# Patient Record
Sex: Female | Born: 1970 | Race: Black or African American | Hispanic: No | Smoking: Never smoker
Health system: Southern US, Community
[De-identification: ages and names within clinical notes are randomized; demographics above are authoritative.]

## PROBLEM LIST (undated history)

## (undated) DIAGNOSIS — E119 Type 2 diabetes mellitus without complications: Secondary | ICD-10-CM

## (undated) DIAGNOSIS — U071 COVID-19: Secondary | ICD-10-CM

## (undated) DIAGNOSIS — D649 Anemia, unspecified: Secondary | ICD-10-CM

## (undated) DIAGNOSIS — J189 Pneumonia, unspecified organism: Secondary | ICD-10-CM

## (undated) DIAGNOSIS — I10 Essential (primary) hypertension: Secondary | ICD-10-CM

## (undated) DIAGNOSIS — C801 Malignant (primary) neoplasm, unspecified: Secondary | ICD-10-CM

## (undated) DIAGNOSIS — Z9889 Other specified postprocedural states: Secondary | ICD-10-CM

## (undated) DIAGNOSIS — Z973 Presence of spectacles and contact lenses: Secondary | ICD-10-CM

## (undated) DIAGNOSIS — M1990 Unspecified osteoarthritis, unspecified site: Secondary | ICD-10-CM

## (undated) DIAGNOSIS — C50919 Malignant neoplasm of unspecified site of unspecified female breast: Secondary | ICD-10-CM

## (undated) DIAGNOSIS — K219 Gastro-esophageal reflux disease without esophagitis: Secondary | ICD-10-CM

## (undated) DIAGNOSIS — R0600 Dyspnea, unspecified: Secondary | ICD-10-CM

## (undated) DIAGNOSIS — R112 Nausea with vomiting, unspecified: Secondary | ICD-10-CM

## (undated) DIAGNOSIS — Z992 Dependence on renal dialysis: Secondary | ICD-10-CM

## (undated) DIAGNOSIS — S82899A Other fracture of unspecified lower leg, initial encounter for closed fracture: Secondary | ICD-10-CM

## (undated) DIAGNOSIS — J45909 Unspecified asthma, uncomplicated: Secondary | ICD-10-CM

## (undated) DIAGNOSIS — N186 End stage renal disease: Secondary | ICD-10-CM

## (undated) DIAGNOSIS — Z5189 Encounter for other specified aftercare: Secondary | ICD-10-CM

## (undated) DIAGNOSIS — G4733 Obstructive sleep apnea (adult) (pediatric): Secondary | ICD-10-CM

## (undated) HISTORY — PX: BREAST LUMPECTOMY: SHX2

## (undated) HISTORY — PX: MASTECTOMY: SHX3

## (undated) HISTORY — PX: DILATION AND CURETTAGE OF UTERUS: SHX78

## (undated) HISTORY — PX: ABDOMINAL HYSTERECTOMY: SHX81

## (undated) HISTORY — PX: CHOLECYSTECTOMY: SHX55

---

## 2012-09-27 ENCOUNTER — Emergency Department (HOSPITAL_COMMUNITY): Payer: BC Managed Care – PPO

## 2012-09-27 ENCOUNTER — Encounter (HOSPITAL_COMMUNITY): Payer: Self-pay | Admitting: *Deleted

## 2012-09-27 ENCOUNTER — Emergency Department (HOSPITAL_COMMUNITY)
Admission: EM | Admit: 2012-09-27 | Discharge: 2012-09-27 | Disposition: A | Discharge status: Home / Self Care | Payer: BC Managed Care – PPO | Attending: Emergency Medicine | Admitting: Emergency Medicine

## 2012-09-27 DIAGNOSIS — H5710 Ocular pain, unspecified eye: Secondary | ICD-10-CM | POA: Insufficient documentation

## 2012-09-27 DIAGNOSIS — I10 Essential (primary) hypertension: Secondary | ICD-10-CM | POA: Insufficient documentation

## 2012-09-27 DIAGNOSIS — D649 Anemia, unspecified: Secondary | ICD-10-CM | POA: Insufficient documentation

## 2012-09-27 DIAGNOSIS — I498 Other specified cardiac arrhythmias: Secondary | ICD-10-CM | POA: Insufficient documentation

## 2012-09-27 DIAGNOSIS — R51 Headache: Secondary | ICD-10-CM | POA: Insufficient documentation

## 2012-09-27 DIAGNOSIS — H538 Other visual disturbances: Secondary | ICD-10-CM | POA: Insufficient documentation

## 2012-09-27 DIAGNOSIS — N289 Disorder of kidney and ureter, unspecified: Secondary | ICD-10-CM | POA: Insufficient documentation

## 2012-09-27 DIAGNOSIS — R61 Generalized hyperhidrosis: Secondary | ICD-10-CM | POA: Insufficient documentation

## 2012-09-27 DIAGNOSIS — R0789 Other chest pain: Secondary | ICD-10-CM

## 2012-09-27 DIAGNOSIS — R071 Chest pain on breathing: Secondary | ICD-10-CM | POA: Insufficient documentation

## 2012-09-27 DIAGNOSIS — E119 Type 2 diabetes mellitus without complications: Secondary | ICD-10-CM | POA: Insufficient documentation

## 2012-09-27 HISTORY — DX: Encounter for other specified aftercare: Z51.89

## 2012-09-27 HISTORY — DX: Essential (primary) hypertension: I10

## 2012-09-27 HISTORY — DX: Type 2 diabetes mellitus without complications: E11.9

## 2012-09-27 LAB — BASIC METABOLIC PANEL
BUN: 30 mg/dL — ABNORMAL HIGH (ref 6–23)
CO2: 18 mEq/L — ABNORMAL LOW (ref 19–32)
Chloride: 101 mEq/L (ref 96–112)
GFR calc non Af Amer: 26 mL/min — ABNORMAL LOW (ref >90)
Glucose, Bld: 187 mg/dL — ABNORMAL HIGH (ref 70–99)
Potassium: 4.2 mEq/L (ref 3.5–5.1)

## 2012-09-27 LAB — DIFFERENTIAL
Eosinophils Absolute: 0.5 10*3/uL (ref 0.0–0.7)
Eosinophils Relative: 4 % (ref 0–5)
Lymphs Abs: 3.8 10*3/uL (ref 0.7–4.0)

## 2012-09-27 LAB — PROTIME-INR: Prothrombin Time: 13.2 seconds (ref 11.6–15.2)

## 2012-09-27 LAB — CBC
MCH: 26.9 pg (ref 26.0–34.0)
MCV: 81.3 fL (ref 78.0–100.0)
Platelets: 366 10*3/uL (ref 150–400)
RBC: 3.68 MIL/uL — ABNORMAL LOW (ref 3.87–5.11)
RDW: 12.9 % (ref 11.5–15.5)

## 2012-09-27 MED ORDER — KETOROLAC TROMETHAMINE 30 MG/ML IJ SOLN
30.0000 mg | Freq: Once | INTRAMUSCULAR | Status: AC
  Administered 2012-09-27: 30 mg via INTRAVENOUS
  Filled 2012-09-27: qty 1

## 2012-09-27 MED ORDER — ONDANSETRON HCL 4 MG/2ML IJ SOLN
INTRAMUSCULAR | Status: AC
  Administered 2012-09-27: 4 mg via INTRAVENOUS
  Filled 2012-09-27: qty 2

## 2012-09-27 MED ORDER — ONDANSETRON HCL 4 MG/2ML IJ SOLN
4.0000 mg | Freq: Once | INTRAMUSCULAR | Status: AC
  Administered 2012-09-27: 4 mg via INTRAVENOUS

## 2012-09-27 MED ORDER — OXYCODONE-ACETAMINOPHEN 5-325 MG PO TABS: 1.0000 | ORAL_TABLET | Freq: Four times a day (QID) | ORAL | Status: DC | PRN

## 2012-09-27 MED ORDER — METOCLOPRAMIDE HCL 5 MG/ML IJ SOLN
10.0000 mg | Freq: Once | INTRAMUSCULAR | Status: AC
  Administered 2012-09-27: 10 mg via INTRAVENOUS
  Filled 2012-09-27: qty 2

## 2012-09-27 MED ORDER — DIPHENHYDRAMINE HCL 50 MG/ML IJ SOLN
12.5000 mg | Freq: Once | INTRAMUSCULAR | Status: AC
  Administered 2012-09-27: 12.5 mg via INTRAVENOUS
  Filled 2012-09-27: qty 1

## 2012-09-27 MED ORDER — SODIUM CHLORIDE 0.9 % IV BOLUS (SEPSIS)
500.0000 mL | Freq: Once | INTRAVENOUS | Status: AC
  Administered 2012-09-27: 500 mL via INTRAVENOUS

## 2012-09-27 MED ORDER — HEPARIN SODIUM (PORCINE) 5000 UNIT/ML IJ SOLN: 60.0000 [IU]/kg | Freq: Once | INTRAMUSCULAR | Status: DC

## 2012-09-27 MED ORDER — ASPIRIN 81 MG PO CHEW
324.0000 mg | CHEWABLE_TABLET | Freq: Once | ORAL | Status: AC
  Administered 2012-09-27: 324 mg via ORAL
  Filled 2012-09-27: qty 4

## 2012-09-27 NOTE — ED Notes (Signed)
Awaiting MD evaluation.

## 2012-09-27 NOTE — ED Notes (Addendum)
States that the pain she is having is in the left side of her chest, radiating up into the left side of her face and feels tight.  Pt appears very anxious.  States that her left neck and left shoulder feel tender to touch.

## 2012-09-27 NOTE — ED Provider Notes (Signed)
History   This chart was scribed for Nat Christen, MD scribed by Mitchell Heir. The patient was seen in room APA02/APA02 at 21:59   CSN: [id number]  Arrival date & time 09/27/12  2038  Chief Complaint  Patient presents with  . Chest Pain    (Consider location/radiation/quality/duration/timing/severity/associated sxs/prior treatment) The history is provided by the patient. No language interpreter was used.   Patricia [name] is a 41 y.o. female who presents to the Emergency Department complaining of constant moderate nagging, dull CP, onset approximately 5 hours ago and located at left superior chest wall that radiates into left arm.Reports associated diaphoresis last night as well, but states it has since resolved. Patient also explains this morning after she woke up she noticed she had blurry vision and says she was seeing "black dots." She attributed it to her HTN and says she took her medication, but it did not improve until this evening. Pt states that at ED arrival the left side of her head and eyes started hurting. Patient states that she has hx of HTN, and DM, but denies family cardiac hx or smoking. She says she has been taking all medications as prescribed and denies SOB, nausea, and emesis. PCP: Dr. Legrand Rams Past Medical History  Diagnosis Date  . Hypertension   . Diabetes mellitus without complication   . Blood transfusion without reported diagnosis     Past Surgical History  Procedure Date  . Cholecystectomy   . Cesarean section   . Dilation and curettage of uterus   . Abdominal hysterectomy     No family history on file.  History  Substance Use Topics  . Smoking status: Never Smoker   . Smokeless tobacco: Not on file  . Alcohol Use: No   Review of Systems  All other systems reviewed and are negative.   10 Systems reviewed and are negative for acute change except as noted in the HPI. Allergies  Review of patient's allergies indicates no known allergies.  Home  Medications   Current Outpatient Rx  Name Route Sig Dispense Refill  . AMLODIPINE BESYLATE 10 MG PO TABS Oral Take 10 mg by mouth daily.    Marland Kitchen GLIPIZIDE 10 MG PO TABS Oral Take 10 mg by mouth 2 (two) times daily before a meal.    . LISINOPRIL-HYDROCHLOROTHIAZIDE 20-25 MG PO TABS Oral Take 1 tablet by mouth daily.    Marland Kitchen METFORMIN HCL 1000 MG PO TABS Oral Take 1,000 mg by mouth 2 (two) times daily with a meal.    . OMEPRAZOLE 20 MG PO CPDR Oral Take 20 mg by mouth daily.      BP 134/81  Pulse 92  Temp 98.1 F (36.7 C) (Oral)  Resp 20  Ht 5\' 3"  (1.6 m)  Wt 248 lb (112.492 kg)  BMI 43.93 kg/m2  SpO2 99%  Physical Exam  Nursing note and vitals reviewed. Constitutional: She is oriented to person, place, and time. She appears well-developed and well-nourished.       Obese  HENT:  Head: Normocephalic and atraumatic.  Eyes: Conjunctivae normal and EOM are normal. Pupils are equal, round, and reactive to light.  Neck: Normal range of motion. Neck supple.  Cardiovascular: Normal rate, regular rhythm and normal heart sounds.   Pulmonary/Chest: Effort normal and breath sounds normal.       Tender at left superior chest wall  Abdominal: Soft. Bowel sounds are normal.  Musculoskeletal: Normal range of motion.       Tender at left  arm  Neurological: She is alert and oriented to person, place, and time.  Skin: Skin is warm and dry.  Psychiatric: She has a normal mood and affect.    ED Course  Procedures (including critical care time) DIAGNOSTIC STUDIES: Oxygen Saturation is 100% on room air, normal by my interpretation.    COORDINATION OF CARE: 22:04 Provided intent to perform CXR and provide Toradol and Zofran. Pt and family agreeable.    Labs Reviewed  CBC  DIFFERENTIAL  PROTIME-INR  APTT  BASIC METABOLIC PANEL   Dg Chest Portable 1 View  09/27/2012  *RADIOLOGY REPORT*  Clinical Data: Left-sided chest pain  PORTABLE CHEST - 1 VIEW  Comparison: None.  Findings: The heart,  mediastinum and hila are within normal limits. The lungs are clear.  No pleural effusion or pneumothorax.  The bony thorax is intact.  IMPRESSION: Normal AP chest radiograph   Original Report Authenticated By: Lasandra Beech, M.D.      No diagnosis found.  Date: 09/27/2012  Rate: 103  Rhythm: sinus tachycardia  QRS Axis: normal  Intervals: normal  ST/T Wave abnormalities: normal  Conduction Disutrbances:none  Narrative Interpretation:   Old EKG Reviewed: none available   MDM  Obvious tenderness over left chest wall suggest chest wall pain. I discussed abnormal lab tests with patient and her husband including hemoglobin of 9.9, creatinine of 2.2, glucose of 187. Discharge home on Percocet     I personally performed the services described in this documentation, which was scribed in my presence. The recorded information has been reviewed and considered.        Nat Christen, MD 09/27/12 2322

## 2012-09-27 NOTE — ED Notes (Signed)
Pt with left chest pain that radiates to left arm

## 2012-09-27 NOTE — ED Notes (Addendum)
Complains of extreme nausea and agitation after medication, which passed in approx. 2 min.  MD made aware.

## 2012-09-27 NOTE — ED Notes (Signed)
Pt states she is feeling fine now, resting quietly, no further complaints of nausea, does not appear anxious at present, will continue to monitor.

## 2012-09-29 LAB — POCT I-STAT, CHEM 8
BUN: 32 mg/dL — ABNORMAL HIGH (ref 6–23)
Chloride: 110 mEq/L (ref 96–112)
Creatinine, Ser: 2.2 mg/dL — ABNORMAL HIGH (ref 0.50–1.10)
Potassium: 4.5 mEq/L (ref 3.5–5.1)
Sodium: 138 mEq/L (ref 135–145)

## 2013-10-15 ENCOUNTER — Other Ambulatory Visit (HOSPITAL_COMMUNITY): Payer: Self-pay | Admitting: Family Medicine

## 2013-10-15 DIAGNOSIS — Z139 Encounter for screening, unspecified: Secondary | ICD-10-CM

## 2013-10-28 ENCOUNTER — Other Ambulatory Visit (HOSPITAL_COMMUNITY): Payer: Self-pay | Admitting: Nephrology

## 2013-10-28 DIAGNOSIS — N289 Disorder of kidney and ureter, unspecified: Secondary | ICD-10-CM

## 2013-10-29 ENCOUNTER — Ambulatory Visit (HOSPITAL_COMMUNITY): Payer: BC Managed Care – PPO

## 2013-10-30 ENCOUNTER — Ambulatory Visit (HOSPITAL_COMMUNITY)
Admission: RE | Admit: 2013-10-30 | Discharge: 2013-10-30 | Disposition: A | Discharge status: Home / Self Care | Payer: BC Managed Care – PPO | Source: Ambulatory Visit | Attending: Nephrology | Admitting: Nephrology

## 2013-10-30 DIAGNOSIS — N289 Disorder of kidney and ureter, unspecified: Secondary | ICD-10-CM | POA: Insufficient documentation

## 2013-11-17 ENCOUNTER — Other Ambulatory Visit (HOSPITAL_COMMUNITY): Payer: Self-pay | Admitting: Nephrology

## 2013-11-17 DIAGNOSIS — N281 Cyst of kidney, acquired: Secondary | ICD-10-CM

## 2013-11-23 ENCOUNTER — Ambulatory Visit (HOSPITAL_COMMUNITY)
Admission: RE | Admit: 2013-11-23 | Discharge: 2013-11-23 | Disposition: A | Discharge status: Home / Self Care | Payer: BC Managed Care – PPO | Source: Ambulatory Visit | Attending: Nephrology | Admitting: Nephrology

## 2013-11-23 DIAGNOSIS — Q6101 Congenital single renal cyst: Secondary | ICD-10-CM | POA: Insufficient documentation

## 2013-11-23 DIAGNOSIS — N289 Disorder of kidney and ureter, unspecified: Secondary | ICD-10-CM | POA: Insufficient documentation

## 2013-11-23 DIAGNOSIS — N281 Cyst of kidney, acquired: Secondary | ICD-10-CM

## 2014-11-16 HISTORY — PX: AV FISTULA PLACEMENT: SHX1204

## 2015-11-15 DIAGNOSIS — Z1389 Encounter for screening for other disorder: Secondary | ICD-10-CM | POA: Diagnosis not present

## 2015-11-15 DIAGNOSIS — R011 Cardiac murmur, unspecified: Secondary | ICD-10-CM | POA: Diagnosis not present

## 2015-11-15 DIAGNOSIS — Z992 Dependence on renal dialysis: Secondary | ICD-10-CM | POA: Diagnosis not present

## 2015-11-15 DIAGNOSIS — Z6841 Body Mass Index (BMI) 40.0 and over, adult: Secondary | ICD-10-CM | POA: Diagnosis not present

## 2015-11-15 DIAGNOSIS — E1129 Type 2 diabetes mellitus with other diabetic kidney complication: Secondary | ICD-10-CM | POA: Diagnosis not present

## 2015-11-15 DIAGNOSIS — I10 Essential (primary) hypertension: Secondary | ICD-10-CM | POA: Diagnosis not present

## 2015-11-15 DIAGNOSIS — R0600 Dyspnea, unspecified: Secondary | ICD-10-CM | POA: Diagnosis not present

## 2015-11-28 DIAGNOSIS — K219 Gastro-esophageal reflux disease without esophagitis: Secondary | ICD-10-CM | POA: Diagnosis not present

## 2015-11-28 DIAGNOSIS — J9801 Acute bronchospasm: Secondary | ICD-10-CM | POA: Diagnosis not present

## 2015-11-28 DIAGNOSIS — R011 Cardiac murmur, unspecified: Secondary | ICD-10-CM | POA: Diagnosis not present

## 2015-11-30 DIAGNOSIS — E11311 Type 2 diabetes mellitus with unspecified diabetic retinopathy with macular edema: Secondary | ICD-10-CM | POA: Diagnosis not present

## 2015-11-30 DIAGNOSIS — H40053 Ocular hypertension, bilateral: Secondary | ICD-10-CM | POA: Diagnosis not present

## 2015-11-30 DIAGNOSIS — E113513 Type 2 diabetes mellitus with proliferative diabetic retinopathy with macular edema, bilateral: Secondary | ICD-10-CM | POA: Diagnosis not present

## 2015-11-30 DIAGNOSIS — H34212 Partial retinal artery occlusion, left eye: Secondary | ICD-10-CM | POA: Diagnosis not present

## 2015-11-30 DIAGNOSIS — E119 Type 2 diabetes mellitus without complications: Secondary | ICD-10-CM | POA: Diagnosis not present

## 2015-12-04 ENCOUNTER — Inpatient Hospital Stay (HOSPITAL_COMMUNITY)
Admission: EM | Admit: 2015-12-04 | Discharge: 2015-12-06 | DRG: 193 | Disposition: A | Discharge status: Home / Self Care | Payer: BLUE CROSS/BLUE SHIELD | Attending: Internal Medicine | Admitting: Internal Medicine

## 2015-12-04 ENCOUNTER — Emergency Department (HOSPITAL_COMMUNITY): Payer: BLUE CROSS/BLUE SHIELD

## 2015-12-04 ENCOUNTER — Encounter (HOSPITAL_COMMUNITY): Payer: Self-pay | Admitting: *Deleted

## 2015-12-04 DIAGNOSIS — E119 Type 2 diabetes mellitus without complications: Secondary | ICD-10-CM

## 2015-12-04 DIAGNOSIS — R0602 Shortness of breath: Secondary | ICD-10-CM | POA: Diagnosis not present

## 2015-12-04 DIAGNOSIS — Z8701 Personal history of pneumonia (recurrent): Secondary | ICD-10-CM | POA: Diagnosis not present

## 2015-12-04 DIAGNOSIS — Z992 Dependence on renal dialysis: Secondary | ICD-10-CM | POA: Diagnosis not present

## 2015-12-04 DIAGNOSIS — E669 Obesity, unspecified: Secondary | ICD-10-CM | POA: Diagnosis present

## 2015-12-04 DIAGNOSIS — I120 Hypertensive chronic kidney disease with stage 5 chronic kidney disease or end stage renal disease: Secondary | ICD-10-CM | POA: Diagnosis present

## 2015-12-04 DIAGNOSIS — Z794 Long term (current) use of insulin: Secondary | ICD-10-CM | POA: Diagnosis not present

## 2015-12-04 DIAGNOSIS — Z7984 Long term (current) use of oral hypoglycemic drugs: Secondary | ICD-10-CM

## 2015-12-04 DIAGNOSIS — E1122 Type 2 diabetes mellitus with diabetic chronic kidney disease: Secondary | ICD-10-CM | POA: Diagnosis present

## 2015-12-04 DIAGNOSIS — J811 Chronic pulmonary edema: Secondary | ICD-10-CM | POA: Diagnosis present

## 2015-12-04 DIAGNOSIS — J189 Pneumonia, unspecified organism: Principal | ICD-10-CM | POA: Diagnosis present

## 2015-12-04 DIAGNOSIS — Y95 Nosocomial condition: Secondary | ICD-10-CM | POA: Diagnosis present

## 2015-12-04 DIAGNOSIS — Z7982 Long term (current) use of aspirin: Secondary | ICD-10-CM

## 2015-12-04 DIAGNOSIS — Z6838 Body mass index (BMI) 38.0-38.9, adult: Secondary | ICD-10-CM

## 2015-12-04 DIAGNOSIS — E1165 Type 2 diabetes mellitus with hyperglycemia: Secondary | ICD-10-CM | POA: Diagnosis present

## 2015-12-04 DIAGNOSIS — N186 End stage renal disease: Secondary | ICD-10-CM | POA: Diagnosis not present

## 2015-12-04 DIAGNOSIS — R51 Headache: Secondary | ICD-10-CM | POA: Diagnosis present

## 2015-12-04 DIAGNOSIS — D649 Anemia, unspecified: Secondary | ICD-10-CM | POA: Diagnosis present

## 2015-12-04 DIAGNOSIS — Z79891 Long term (current) use of opiate analgesic: Secondary | ICD-10-CM | POA: Diagnosis not present

## 2015-12-04 HISTORY — DX: Dependence on renal dialysis: Z99.2

## 2015-12-04 LAB — CBC WITH DIFFERENTIAL/PLATELET
BASOS PCT: 0 %
Basophils Absolute: 0 10*3/uL (ref 0.0–0.1)
Eosinophils Absolute: 0.2 10*3/uL (ref 0.0–0.7)
Eosinophils Relative: 1 %
HCT: 27.8 % — ABNORMAL LOW (ref 36.0–46.0)
HEMOGLOBIN: 8.8 g/dL — AB (ref 12.0–15.0)
LYMPHS PCT: 7 %
Lymphs Abs: 1.3 10*3/uL (ref 0.7–4.0)
MCH: 27.2 pg (ref 26.0–34.0)
MCHC: 31.7 g/dL (ref 30.0–36.0)
MCV: 86.1 fL (ref 78.0–100.0)
MONOS PCT: 3 %
Monocytes Absolute: 0.6 10*3/uL (ref 0.1–1.0)
NEUTROS ABS: 15.9 10*3/uL — AB (ref 1.7–7.7)
NEUTROS PCT: 89 %
Platelets: 272 10*3/uL (ref 150–400)
RBC: 3.23 MIL/uL — ABNORMAL LOW (ref 3.87–5.11)
RDW: 16.3 % — ABNORMAL HIGH (ref 11.5–15.5)
WBC: 18 10*3/uL — ABNORMAL HIGH (ref 4.0–10.5)

## 2015-12-04 LAB — BASIC METABOLIC PANEL
ANION GAP: 14 (ref 5–15)
BUN: 35 mg/dL — ABNORMAL HIGH (ref 6–20)
CO2: 24 mmol/L (ref 22–32)
Calcium: 9.8 mg/dL (ref 8.9–10.3)
Chloride: 100 mmol/L — ABNORMAL LOW (ref 101–111)
Creatinine, Ser: 7.43 mg/dL — ABNORMAL HIGH (ref 0.44–1.00)
GFR calc non Af Amer: 6 mL/min — ABNORMAL LOW (ref >60)
GFR, EST AFRICAN AMERICAN: 7 mL/min — AB (ref >60)
Glucose, Bld: 176 mg/dL — ABNORMAL HIGH (ref 65–99)
Potassium: 3.7 mmol/L (ref 3.5–5.1)
Sodium: 138 mmol/L (ref 135–145)

## 2015-12-04 LAB — GLUCOSE, CAPILLARY: GLUCOSE-CAPILLARY: 251 mg/dL — AB (ref 65–99)

## 2015-12-04 MED ORDER — SODIUM CHLORIDE 0.9 % IV SOLN: 100.0000 mL | INTRAVENOUS | Status: DC | PRN

## 2015-12-04 MED ORDER — VANCOMYCIN HCL IN DEXTROSE 1-5 GM/200ML-% IV SOLN
1000.0000 mg | Freq: Once | INTRAVENOUS | Status: AC
  Administered 2015-12-04: 1000 mg via INTRAVENOUS
  Filled 2015-12-04: qty 200

## 2015-12-04 MED ORDER — HEPARIN SODIUM (PORCINE) 1000 UNIT/ML DIALYSIS
1000.0000 [IU] | INTRAMUSCULAR | Status: DC | PRN
  Administered 2015-12-04 – 2015-12-05 (×2): 3400 [IU] via INTRAVENOUS_CENTRAL
  Filled 2015-12-04 (×3): qty 1

## 2015-12-04 MED ORDER — ALTEPLASE 2 MG IJ SOLR
2.0000 mg | Freq: Once | INTRAMUSCULAR | Status: DC | PRN
  Filled 2015-12-04: qty 2

## 2015-12-04 MED ORDER — HEPARIN SODIUM (PORCINE) 1000 UNIT/ML IJ SOLN
INTRAMUSCULAR | Status: AC
  Administered 2015-12-04: 3400 [IU] via INTRAVENOUS_CENTRAL
  Filled 2015-12-04: qty 6

## 2015-12-04 MED ORDER — VANCOMYCIN HCL IN DEXTROSE 1-5 GM/200ML-% IV SOLN
1000.0000 mg | INTRAVENOUS | Status: DC
  Administered 2015-12-05: 1000 mg via INTRAVENOUS
  Filled 2015-12-04: qty 200

## 2015-12-04 MED ORDER — CEFEPIME HCL 2 G IJ SOLR
2.0000 g | Freq: Once | INTRAMUSCULAR | Status: AC
  Administered 2015-12-04: 2 g via INTRAVENOUS
  Filled 2015-12-04: qty 2

## 2015-12-04 MED ORDER — VANCOMYCIN HCL IN DEXTROSE 1-5 GM/200ML-% IV SOLN
1000.0000 mg | Freq: Once | INTRAVENOUS | Status: AC
  Administered 2015-12-04: 1000 mg via INTRAVENOUS
  Filled 2015-12-04 (×2): qty 200

## 2015-12-04 MED ORDER — CALCIUM CARBONATE ANTACID 500 MG PO CHEW
1.0000 | CHEWABLE_TABLET | Freq: Two times a day (BID) | ORAL | Status: DC | PRN
  Administered 2015-12-04 – 2015-12-06 (×3): 200 mg via ORAL
  Filled 2015-12-04 (×3): qty 1

## 2015-12-04 MED ORDER — AMLODIPINE BESYLATE 5 MG PO TABS: 10.0000 mg | ORAL_TABLET | Freq: Every day | ORAL | Status: DC

## 2015-12-04 MED ORDER — SODIUM CHLORIDE 0.9 % IV SOLN
250.0000 mL | INTRAVENOUS | Status: DC | PRN
  Administered 2015-12-04: 10 mL via INTRAVENOUS

## 2015-12-04 MED ORDER — INSULIN ASPART 100 UNIT/ML ~~LOC~~ SOLN
4.0000 [IU] | Freq: Three times a day (TID) | SUBCUTANEOUS | Status: DC
  Administered 2015-12-05 – 2015-12-06 (×3): 4 [IU] via SUBCUTANEOUS

## 2015-12-04 MED ORDER — CALCIUM ACETATE (PHOS BINDER) 667 MG PO CAPS
725.0000 mg | ORAL_CAPSULE | Freq: Three times a day (TID) | ORAL | Status: DC
  Administered 2015-12-04 – 2015-12-06 (×7): 667 mg via ORAL
  Filled 2015-12-04 (×7): qty 1

## 2015-12-04 MED ORDER — ASPIRIN EC 81 MG PO TBEC
81.0000 mg | DELAYED_RELEASE_TABLET | Freq: Every day | ORAL | Status: DC
  Administered 2015-12-04 – 2015-12-06 (×3): 81 mg via ORAL
  Filled 2015-12-04 (×3): qty 1

## 2015-12-04 MED ORDER — SODIUM CHLORIDE 0.9 % IJ SOLN
3.0000 mL | Freq: Two times a day (BID) | INTRAMUSCULAR | Status: DC
  Administered 2015-12-04 – 2015-12-06 (×4): 3 mL via INTRAVENOUS

## 2015-12-04 MED ORDER — VANCOMYCIN HCL 500 MG IV SOLR
500.0000 mg | Freq: Once | INTRAVENOUS | Status: AC
  Administered 2015-12-04: 500 mg via INTRAVENOUS
  Filled 2015-12-04: qty 500

## 2015-12-04 MED ORDER — ACETAMINOPHEN 650 MG RE SUPP: 650.0000 mg | Freq: Four times a day (QID) | RECTAL | Status: DC | PRN

## 2015-12-04 MED ORDER — ONDANSETRON HCL 4 MG/2ML IJ SOLN: 4.0000 mg | Freq: Four times a day (QID) | INTRAMUSCULAR | Status: DC | PRN

## 2015-12-04 MED ORDER — FUROSEMIDE 40 MG PO TABS
120.0000 mg | ORAL_TABLET | Freq: Two times a day (BID) | ORAL | Status: DC
  Administered 2015-12-04 – 2015-12-06 (×4): 120 mg via ORAL
  Filled 2015-12-04 (×4): qty 1

## 2015-12-04 MED ORDER — LIDOCAINE-PRILOCAINE 2.5-2.5 % EX CREA: 1.0000 "application " | TOPICAL_CREAM | CUTANEOUS | Status: DC | PRN

## 2015-12-04 MED ORDER — LIDOCAINE HCL (PF) 1 % IJ SOLN: 5.0000 mL | INTRAMUSCULAR | Status: DC | PRN

## 2015-12-04 MED ORDER — SODIUM CHLORIDE 0.9 % IJ SOLN
10.0000 mL | INTRAMUSCULAR | Status: DC | PRN
  Administered 2015-12-04 – 2015-12-05 (×3): 10 mL via INTRAVENOUS
  Filled 2015-12-04 (×3): qty 10

## 2015-12-04 MED ORDER — SODIUM CHLORIDE 0.9 % IJ SOLN
INTRAMUSCULAR | Status: AC
  Administered 2015-12-04: 10 mL via INTRAVENOUS
  Filled 2015-12-04: qty 18

## 2015-12-04 MED ORDER — HEPARIN SODIUM (PORCINE) 5000 UNIT/ML IJ SOLN
5000.0000 [IU] | Freq: Three times a day (TID) | INTRAMUSCULAR | Status: DC
  Administered 2015-12-04 – 2015-12-06 (×5): 5000 [IU] via SUBCUTANEOUS
  Filled 2015-12-04 (×6): qty 1

## 2015-12-04 MED ORDER — INSULIN ASPART 100 UNIT/ML ~~LOC~~ SOLN
0.0000 [IU] | Freq: Every day | SUBCUTANEOUS | Status: DC
  Administered 2015-12-04: 3 [IU] via SUBCUTANEOUS

## 2015-12-04 MED ORDER — NIFEDIPINE ER OSMOTIC RELEASE 30 MG PO TB24
30.0000 mg | ORAL_TABLET | Freq: Every day | ORAL | Status: DC
  Administered 2015-12-04: 30 mg via ORAL
  Filled 2015-12-04: qty 1

## 2015-12-04 MED ORDER — INSULIN ASPART 100 UNIT/ML ~~LOC~~ SOLN
0.0000 [IU] | Freq: Three times a day (TID) | SUBCUTANEOUS | Status: DC
  Administered 2015-12-05 (×2): 5 [IU] via SUBCUTANEOUS
  Administered 2015-12-06: 3 [IU] via SUBCUTANEOUS
  Administered 2015-12-06: 5 [IU] via SUBCUTANEOUS

## 2015-12-04 MED ORDER — VANCOMYCIN HCL 500 MG IV SOLR
INTRAVENOUS | Status: AC
  Filled 2015-12-04: qty 500

## 2015-12-04 MED ORDER — SODIUM CHLORIDE 0.9 % IJ SOLN: 3.0000 mL | INTRAMUSCULAR | Status: DC | PRN

## 2015-12-04 MED ORDER — DEXTROSE 5 % IV SOLN
2.0000 g | INTRAVENOUS | Status: DC
  Administered 2015-12-05: 2 g via INTRAVENOUS
  Filled 2015-12-04: qty 2

## 2015-12-04 MED ORDER — ONDANSETRON HCL 4 MG PO TABS: 4.0000 mg | ORAL_TABLET | Freq: Four times a day (QID) | ORAL | Status: DC | PRN

## 2015-12-04 MED ORDER — PENTAFLUOROPROP-TETRAFLUOROETH EX AERO: 1.0000 "application " | INHALATION_SPRAY | CUTANEOUS | Status: DC | PRN

## 2015-12-04 MED ORDER — LABETALOL HCL 200 MG PO TABS
200.0000 mg | ORAL_TABLET | Freq: Two times a day (BID) | ORAL | Status: DC
  Administered 2015-12-04 (×2): 200 mg via ORAL
  Filled 2015-12-04 (×2): qty 1

## 2015-12-04 MED ORDER — ACETAMINOPHEN 325 MG PO TABS
650.0000 mg | ORAL_TABLET | Freq: Four times a day (QID) | ORAL | Status: DC | PRN
  Administered 2015-12-05 (×2): 650 mg via ORAL
  Filled 2015-12-04 (×2): qty 2

## 2015-12-04 NOTE — ED Provider Notes (Signed)
CSN: [id number]     Arrival date & time 12/04/15  0048 History   First MD Initiated Contact with Patient 12/04/15 0101     Chief Complaint  Patient presents with  . Shortness of Breath     (Consider location/radiation/quality/duration/timing/severity/associated sxs/prior Treatment) HPI  This 44 year old female with a history of hypertension, diabetes, end-stage renal disease on dialysis Monday, Wednesday and Friday who presents with a cough and shortness of breath. Patient reports she has had acute on chronic cough. She's had a dry cough since September which she has seen both her primary physician and her nephrologist for.  She finished a course of amoxicillin last week for a possible urinary tract infection but was also told that it would cover for pneumonia as well. Over the last week, she has developed a productive cough of sputum and noted dark streaks in the sputum last night. She reports worsening shortness of breath over last 1-2 days. Denies fever or chills. Does not or oxygen at home. Last dialyzed on Friday and reports that she is normally "below my dry weight when I dialyze."  Her primary nephrologist is Dr. Hinda Lenis.  She also reports left ear pain. History of tempanostomy tube in that ear.  Past Medical History  Diagnosis Date  . Hypertension   . Diabetes mellitus without complication ([place])   . Blood transfusion without reported diagnosis   . Dialysis patient ([place])     mon, wed, friday,   . Renal disorder    Past Surgical History  Procedure Laterality Date  . Cholecystectomy    . Cesarean section    . Dilation and curettage of uterus    . Abdominal hysterectomy     No family history on file. Social History  Substance Use Topics  . Smoking status: Never Smoker   . Smokeless tobacco: None  . Alcohol Use: No   OB History    No data available     Review of Systems  Constitutional: Negative for fever.  HENT: Positive for ear pain.   Respiratory: Positive for  cough and shortness of breath. Negative for chest tightness.   Cardiovascular: Positive for leg swelling. Negative for chest pain.  Gastrointestinal: Negative for nausea, vomiting and abdominal pain.  Genitourinary: Negative for dysuria.  Skin: Negative for wound.  All other systems reviewed and are negative.     Allergies  Amlodipine besylate and Reglan  Home Medications   Prior to Admission medications   Medication Sig Start Date End Date Taking? Authorizing Provider  aspirin 81 MG tablet Take 81 mg by mouth daily.   Yes Historical Provider, MD  calcium acetate (PHOSLO) 667 MG capsule Take 725 mg by mouth 3 (three) times daily with meals.   Yes Historical Provider, MD  furosemide (LASIX) 40 MG tablet Take 120 mg by mouth 2 (two) times daily.   Yes Historical Provider, MD  glipiZIDE (GLUCOTROL) 10 MG tablet Take 5 mg by mouth 2 (two) times daily before a meal.    Yes Historical Provider, MD  labetalol (NORMODYNE) 200 MG tablet Take 200 mg by mouth 2 (two) times daily.   Yes Historical Provider, MD  NIFEdipine (PROCARDIA-XL/ADALAT-CC/NIFEDICAL-XL) 30 MG 24 hr tablet Take 30 mg by mouth daily.   Yes Historical Provider, MD  amLODipine (NORVASC) 10 MG tablet Take 10 mg by mouth daily.    Historical Provider, MD  lisinopril-hydrochlorothiazide (PRINZIDE,ZESTORETIC) 20-25 MG per tablet Take 1 tablet by mouth daily.    Historical Provider, MD  metFORMIN (GLUCOPHAGE) 1000  MG tablet Take 1,000 mg by mouth 2 (two) times daily with a meal.    Historical Provider, MD  omeprazole (PRILOSEC) 20 MG capsule Take 20 mg by mouth daily.    Historical Provider, MD  oxyCODONE-acetaminophen (PERCOCET/ROXICET) 5-325 MG per tablet Take 1-2 tablets by mouth every 6 (six) hours as needed for pain. 09/27/12   Nat Christen, MD   BP 176/86 mmHg  Pulse 81  Temp(Src) 98.4 F (36.9 C) (Oral)  Resp 27  SpO2 89% Physical Exam  Constitutional: She is oriented to person, place, and time. No distress.  Obese  HENT:   Head: Normocephalic and atraumatic.  Right Ear: External ear normal.  Tympanostomy tube in place left ear  Eyes: Pupils are equal, round, and reactive to light.  Cardiovascular: Normal rate, regular rhythm and normal heart sounds.   No murmur heard. Pulmonary/Chest: Effort normal. No respiratory distress. She has no wheezes. She has rales.  Left lower lobe Rales  Abdominal: Soft. Bowel sounds are normal. There is no tenderness. There is no rebound.  Musculoskeletal: She exhibits edema.  1+ bilateral lower extremity edema  Neurological: She is alert and oriented to person, place, and time.  Skin: Skin is warm and dry.  Psychiatric: She has a normal mood and affect.  Nursing note and vitals reviewed.   ED Course  Procedures (including critical care time) Labs Review Labs Reviewed  CBC WITH DIFFERENTIAL/PLATELET - Abnormal; Notable for the following:    WBC 18.0 (*)    RBC 3.23 (*)    Hemoglobin 8.8 (*)    HCT 27.8 (*)    RDW 16.3 (*)    Neutro Abs 15.9 (*)    All other components within normal limits  BASIC METABOLIC PANEL - Abnormal; Notable for the following:    Chloride 100 (*)    Glucose, Bld 176 (*)    BUN 35 (*)    Creatinine, Ser 7.43 (*)    GFR calc non Af Amer 6 (*)    GFR calc Af Amer 7 (*)    All other components within normal limits  CULTURE, BLOOD (ROUTINE X 2)  CULTURE, BLOOD (ROUTINE X 2)    Imaging Review Dg Chest 2 View  12/04/2015  CLINICAL DATA:  Shortness of breath for 2 days. Cough for 3 months. Hemoptysis. EXAM: CHEST  2 VIEW COMPARISON:  09/27/2012 FINDINGS: Dual lumen right-sided dialysis catheter, tip in the mid SVC. Heart is enlarged. Ill-defined left greater than right perihilar opacities. Question of small bilateral pleural effusions. Mild vascular congestion. No pneumothorax. No acute osseous abnormalities. IMPRESSION: Cardiomegaly. Bilateral perihilar opacities, left greater than right, pneumonia versus pulmonary edema. Probable small pleural  effusions. Electronically Signed   By: Jeb Levering M.D.   On: 12/04/2015 01:47   I have personally reviewed and evaluated these images and lab results as part of my medical decision-making.   EKG Interpretation   Date/Time:  Sunday December 04 2015 01:02:46 EST Ventricular Rate:  88 PR Interval:  187 QRS Duration: 92 QT Interval:  375 QTC Calculation: 454 R Axis:   69 Text Interpretation:  Sinus rhythm Borderline T abnormalities, lateral  leads Confirmed by HORTON  MD, COURTNEY ([id number]) on 12/04/2015 1:57:53 AM      MDM   Final diagnoses:  HCAP (healthcare-associated pneumonia)    Patient presents for shortness of breath and cough that has recently become productive. She is satting 89-91% on room air. Nasal cannula in place for comfort. She is in no acute distress.  Otherwise afebrile. She has rales on exam.  Workup notable for white count of 18. Chest x-ray shows left greater than right bilateral perihilar opacities concerning for pneumonia versus pulmonary edema. She is a dialysis candidate and suspect she has some element of pulmonary edema; however, given productive cough and leukocytosis, also would be concerned for pneumonia. Blood cultures were added and patient was given vancomycin and cefepime. She recently finished a course of amoxicillin and she would be at risk for healthcare associated pneumonia. For this reason, feel patient warrants further inpatient management. Discussed with Dr. Darrick Meigs.      Merryl Hacker, MD 12/04/15 [phone]

## 2015-12-04 NOTE — ED Notes (Signed)
Pt's O2 saturation was fluctuating between 87-91%, 2L [place] was applied pt now at 97%

## 2015-12-04 NOTE — Progress Notes (Signed)
ANTIBIOTIC CONSULT NOTE-Preliminary  Pharmacy Consult for Vancomycin and Cefepime Indication: Pneumonia  Allergies  Allergen Reactions  . Amlodipine Besylate   . Reglan [Metoclopramide]     Patient Measurements: Height: '5\' 3"'$  (160 cm) Weight: 228 lb (103.42 kg) IBW/kg (Calculated) : 52.4 Adjusted Body Weight: 73 kg  Vital Signs: Temp: 99 F (37.2 C) (12/18 0400) Temp Source: Oral (12/18 0400) BP: 183/93 mmHg (12/18 0400) Pulse Rate: 81 (12/18 0400)  Labs:  Recent Labs  12/04/15 0104  WBC 18.0*  HGB 8.8*  PLT 272  CREATININE 7.43*    Estimated Creatinine Clearance: 10.9 mL/min (by C-G formula based on Cr of 7.43).  No results for input(s): VANCOTROUGH, VANCOPEAK, VANCORANDOM, GENTTROUGH, GENTPEAK, GENTRANDOM, TOBRATROUGH, TOBRAPEAK, TOBRARND, AMIKACINPEAK, AMIKACINTROU, AMIKACIN in the last 72 hours.   Microbiology: Recent Results (from the past 720 hour(s))  Culture, blood (Routine X 2) w Reflex to ID Panel     Status: None (Preliminary result)   Collection Time: 12/04/15  2:55 AM  Result Value Ref Range Status   Specimen Description RIGHT ANTECUBITAL  Final   Special Requests BOTTLES DRAWN AEROBIC ONLY 6CC  Final   Culture NO GROWTH < 12 HOURS  Final   Report Status PENDING  Incomplete  Culture, blood (Routine X 2) w Reflex to ID Panel     Status: None (Preliminary result)   Collection Time: 12/04/15  3:00 AM  Result Value Ref Range Status   Specimen Description BLOOD LEFT HAND  Final   Special Requests BOTTLES DRAWN AEROBIC ONLY 6CC  Final   Culture NO GROWTH < 12 HOURS  Final   Report Status PENDING  Incomplete    Medical History: Past Medical History  Diagnosis Date  . Hypertension   . Diabetes mellitus without complication ([place])   . Blood transfusion without reported diagnosis   . Dialysis patient ([place])     mon, wed, friday,   . Renal disorder     Medications:  Vancomycin 1 Gm IV give in the ED on 12/04/15 plus additional '500mg'$  for loading  dose Cefepime 2 Gm IV given in the ED on 12/04/15  Assessment: 44 yo diabetic female with ESRD on hemodialysis MWF seen for worsening SOB, productive cough, and recent OP treatment for UTI with amoxicillin. Pt also has left ear pain and history of tempanostomy tube in left ear. Pt was coughed up dark streaks of blood from sputum. No fever or chills. Chest xray shows small pleural effusions, pulmonary edema versus Pneumonia. Empiric tx with Vancomycin and Cefepime for HCAP. Blood cultures pending. WBC  Elevated at 18k  Goal of Therapy:  Vancomycin levels per hemodialysis protocol Eradicate infection  Plan:  Cefepime 2gm after every HD Vancomycin 1gm after every HD F/U Blood cultures Monitor V/S and labs  Isac Sarna, BS Vena [place], BCPS Clinical Pharmacist Pager [phone] 12/04/2015,8:52 AM

## 2015-12-04 NOTE — Progress Notes (Signed)
Patient briefly seen and examined, chart reviewed. Admitted earlier this a.m. at 4 for shortness of breath. A chest x-ray found to have infiltrates that could represent either pulmonary edema or pneumonia. Given her end-stage renal disease state she was placed on antibiotics to cover healthcare associated pneumonia. Nephrology has been consulted for dialysis session today. We'll continue to follow.  Domingo Mend, MD [facility] Pager: [phone]

## 2015-12-04 NOTE — ED Notes (Signed)
MD at bedside. 

## 2015-12-04 NOTE — Procedures (Signed)
  HEMODIALYSIS TREATMENT NOTE:  4 hour heparin-free dialysis completed via right chest wall PC. Goal met: 3 liters removed without interruption in ultrafiltration.  Questionably high BP during tx; measurements taken in right leg at pt's request due to PIV and AVF placement. Vancomycin and Cefepime given at end of treatment. All blood reinfused. Catheter exit site unremarkable. Report given to Gershon Cull, RN.  Rockwell Alexandria, RN, CDN

## 2015-12-04 NOTE — ED Notes (Signed)
[  pt c/o cough for the past week or more, cough is productive with blood in sputum, sob that started today, denies any fever, chills, c/o pain to left ear and left throat area,

## 2015-12-04 NOTE — Progress Notes (Signed)
ANTIBIOTIC CONSULT NOTE-Preliminary  Pharmacy Consult for Vancomycin and Cefepime Indication: Pneumonia  Allergies  Allergen Reactions  . Amlodipine Besylate   . Reglan [Metoclopramide]     Patient Measurements: Height: '5\' 3"'$  (160 cm) Weight: 228 lb (103.42 kg) IBW/kg (Calculated) : 52.4 Adjusted Body Weight: 73 kg  Vital Signs: Temp: 99 F (37.2 C) (12/18 0400) Temp Source: Oral (12/18 0400) BP: 183/93 mmHg (12/18 0400) Pulse Rate: 81 (12/18 0400)  Labs:  Recent Labs  12/04/15 0104  WBC 18.0*  HGB 8.8*  PLT 272  CREATININE 7.43*    Estimated Creatinine Clearance: 10.9 mL/min (by C-G formula based on Cr of 7.43).  No results for input(s): VANCOTROUGH, VANCOPEAK, VANCORANDOM, GENTTROUGH, GENTPEAK, GENTRANDOM, TOBRATROUGH, TOBRAPEAK, TOBRARND, AMIKACINPEAK, AMIKACINTROU, AMIKACIN in the last 72 hours.   Microbiology: No results found for this or any previous visit (from the past 720 hour(s)).  Medical History: Past Medical History  Diagnosis Date  . Hypertension   . Diabetes mellitus without complication ([place])   . Blood transfusion without reported diagnosis   . Dialysis patient ([place])     mon, wed, friday,   . Renal disorder     Medications:  Vancomycin 1 Gm IV give in the ED on 12/04/15 Cefepime 2 Gm IV given in the ED on 12/04/15  Assessment: 44 yo diabetic female with ESRD on hemodialysis seen for worsening SOB, productive cough, and recent OP treatment for UTI with amoxicillin. Pt also has left ear pain and history of tempanostomy tube in left ear.  Goal of Therapy:  Vancomycin levels per hemodialysis protocol Eradicate infection  Plan:  Preliminary review of pertinent patient information completed.  Protocol will be initiated with a one-time dose of Vancomycin 500 mg in addition to the 1 Gm dose given in the ED for a total dose of 1500 mg IV.Marland Kitchen  Forestine Na clinical pharmacist will complete review during morning rounds to assess patient and finalize  treatment regimen.  Norberto Sorenson, [facility] 12/04/2015,5:08 AM

## 2015-12-04 NOTE — H&P (Signed)
PCP:   [name]   Chief Complaint:  Shortness of breath  HPI:  44 year old female who  has a past medical history of Hypertension; Diabetes mellitus without complication ([place]); Blood transfusion without reported diagnosis; Dialysis patient [facility]); and Renal disorder. Today presents to the hospital with worsening shortness of breath for past 2 days. Patient has and stage renal disease and is on hemodialysis Monday Wednesday Friday. Patient has been coughing since September and was seen by PCP and nephrologist, they could not find anything wrong with patient. She also was diagnosed with UTI and PCP prescribed amoxicillin for a week. This morning patient when she coughed up she had dark streaks of blood in the sputum. Patient denies any fever or chills. She was dialyzed last week on Friday. She also complains of pain in the left side of the neck, patient has tympanostomy tube in that ear. In the ED chest x-ray showed bilateral infiltrates pulmonary edema versus pneumonia. Patient empirically started on vancomycin and cefepime. Blood cultures 2 have been obtained.  Allergies:   Allergies  Allergen Reactions  . Amlodipine Besylate   . Reglan [Metoclopramide]       Past Medical History  Diagnosis Date  . Hypertension   . Diabetes mellitus without complication ([place])   . Blood transfusion without reported diagnosis   . Dialysis patient ([place])     mon, wed, friday,   . Renal disorder     Past Surgical History  Procedure Laterality Date  . Cholecystectomy    . Cesarean section    . Dilation and curettage of uterus    . Abdominal hysterectomy      Prior to Admission medications   Medication Sig Start Date End Date Taking? Authorizing Provider  aspirin 81 MG tablet Take 81 mg by mouth daily.   Yes Historical Provider, MD  calcium acetate (PHOSLO) 667 MG capsule Take 725 mg by mouth 3 (three) times daily with meals.   Yes Historical Provider, MD  furosemide (LASIX) 40 MG  tablet Take 120 mg by mouth 2 (two) times daily.   Yes Historical Provider, MD  glipiZIDE (GLUCOTROL) 10 MG tablet Take 5 mg by mouth 2 (two) times daily before a meal.    Yes Historical Provider, MD  labetalol (NORMODYNE) 200 MG tablet Take 200 mg by mouth 2 (two) times daily.   Yes Historical Provider, MD  NIFEdipine (PROCARDIA-XL/ADALAT-CC/NIFEDICAL-XL) 30 MG 24 hr tablet Take 30 mg by mouth daily.   Yes Historical Provider, MD  amLODipine (NORVASC) 10 MG tablet Take 10 mg by mouth daily.    Historical Provider, MD  lisinopril-hydrochlorothiazide (PRINZIDE,ZESTORETIC) 20-25 MG per tablet Take 1 tablet by mouth daily.    Historical Provider, MD  metFORMIN (GLUCOPHAGE) 1000 MG tablet Take 1,000 mg by mouth 2 (two) times daily with a meal.    Historical Provider, MD  omeprazole (PRILOSEC) 20 MG capsule Take 20 mg by mouth daily.    Historical Provider, MD  oxyCODONE-acetaminophen (PERCOCET/ROXICET) 5-325 MG per tablet Take 1-2 tablets by mouth every 6 (six) hours as needed for pain. 09/27/12   Nat Christen, MD    Social History:  reports that she has never smoked. She does not have any smokeless tobacco history on file. She reports that she does not drink alcohol or use illicit drugs.   All the positives are listed in BOLD  Review of Systems:  HEENT: Headache, blurred vision, runny nose, sore throat Neck: Hypothyroidism, hyperthyroidism,,lymphadenopathy, left-sided neck pain Chest : Shortness of breath,  history of COPD, Asthma Heart : Chest pain, history of coronary arterey disease GI:  Nausea, vomiting, diarrhea, constipation, GERD GU: Dysuria, urgency, frequency of urination, hematuria Neuro: Stroke, seizures, syncope Psych: Depression, anxiety, hallucinations   Physical Exam: Blood pressure 175/88, pulse 83, temperature 98.6 F (37 C), temperature source Oral, resp. rate 29, SpO2 94 %. Constitutional:   Patient is a well-developed and well-nourished female in no acute distress and  cooperative with exam. Head: Normocephalic and atraumatic Mouth: Mucus membranes moist Eyes: PERRL, EOMI, conjunctivae normal Neck: Supple, No Thyromegaly Cardiovascular: RRR, S1 normal, S2 normal Pulmonary/Chest: Decreased breath sounds at lung bases Abdominal: Soft. Non-tender, non-distended, bowel sounds are normal, no masses, organomegaly, or guarding present.  Neurological: A&O x3, Strength is normal and symmetric bilaterally, cranial nerve II-XII are grossly intact, no focal motor deficit, sensory intact to light touch bilaterally.  Extremities : No Cyanosis, Clubbing, bilateral 1+ edema  Labs on Admission:  Basic Metabolic Panel:  Recent Labs Lab 12/04/15 0104  NA 138  K 3.7  CL 100*  CO2 24  GLUCOSE 176*  BUN 35*  CREATININE 7.43*  CALCIUM 9.8   CBC:  Recent Labs Lab 12/04/15 0104  WBC 18.0*  NEUTROABS 15.9*  HGB 8.8*  HCT 27.8*  MCV 86.1  PLT 272    Radiological Exams on Admission: Dg Chest 2 View  12/04/2015  CLINICAL DATA:  Shortness of breath for 2 days. Cough for 3 months. Hemoptysis. EXAM: CHEST  2 VIEW COMPARISON:  09/27/2012 FINDINGS: Dual lumen right-sided dialysis catheter, tip in the mid SVC. Heart is enlarged. Ill-defined left greater than right perihilar opacities. Question of small bilateral pleural effusions. Mild vascular congestion. No pneumothorax. No acute osseous abnormalities. IMPRESSION: Cardiomegaly. Bilateral perihilar opacities, left greater than right, pneumonia versus pulmonary edema. Probable small pleural effusions. Electronically Signed   By: Jeb Levering M.D.   On: 12/04/2015 01:47    EKG: Independently reviewed.  Normal sinus rhythm   Assessment/Plan Active Problems:   HCAP (healthcare-associated pneumonia)   ESRD on dialysis ([place])   Diabetes mellitus ([place])  Healthcare associated pneumonia We'll admit the patient, start vancomycin and cefepime per pharmacy consultation. Obtain blood cultures 2.  Pulmonary  edema Chest x-ray shows shows small pleural effusions and possible pulmonary edema. Patient's breathing is stable on 2 L of oxygen via nasal cannula. Continue Lasix at home dose of 40 mg twice a day Patient might require hemodialysis in a.m. Will consult nephrology.  Diabetes mellitus Hold metformin and glipizide Start sliding scale insulin with NovoLog  Hypertension Blood pressure is controlled, continue labetalol, amlodipine, nifedipine Hold HCTZ/lisinopril  DVT prophylaxis Heparin  Code status: Full code  Family discussion: Admission, patients condition and plan of care including tests being ordered have been discussed with the patient and her husband at bedside who indicate understanding and agree with the plan and Code Status.   Time Spent on Admission: 60 min  [facility] Pager: [phone] 12/04/2015, 3:42 AM  If 7PM-7AM, please contact night-coverage  [email or web address]  Password [email or web address]

## 2015-12-04 NOTE — Consult Note (Signed)
Reason for Consult: End-stage renal disease Referring Physician: Dr. Margot Chimes Patricia [name] is an 44 y.o. female.  HPI: She is a patient who has history of hypertension, diabetes, end-stage renal disease on maintenance hemodialysis presently came with history of cough which seems to be worsening, sputum production and an episode of blood in the sputum. Patient has this chronic cough for some time which comes on and off. Since patient was found to have urinary tract infection she was put on amoxicillin as an outpatient. Even though  patient completed her antibiotics her cough continued to get worse. Patient also started having difficulty in breathing. Presently when she was evaluated patient was found to have bilateral infiltrate consistent with pneumonia versus pulmonary edema. She is on as an oxygen and feels slightly better. Patient denies any fever but she has some chills.  Past Medical History  Diagnosis Date  . Hypertension   . Diabetes mellitus without complication ([place])   . Blood transfusion without reported diagnosis   . Dialysis patient ([place])     mon, wed, friday,   . Renal disorder     Past Surgical History  Procedure Laterality Date  . Cholecystectomy    . Cesarean section    . Dilation and curettage of uterus    . Abdominal hysterectomy      No family history on file.  Social History:  reports that she has never smoked. She does not have any smokeless tobacco history on file. She reports that she does not drink alcohol or use illicit drugs.  Allergies:  Allergies  Allergen Reactions  . Reglan [Metoclopramide] Other (See Comments)    hallucinations  . Amlodipine Besylate Rash and Other (See Comments)    dizziness    Medications: I have reviewed the patient's current medications.  Results for orders placed or performed during the hospital encounter of 12/04/15 (from the past 48 hour(s))  CBC with Differential     Status: Abnormal   Collection Time: 12/04/15  1:04 AM   Result Value Ref Range   WBC 18.0 (H) 4.0 - 10.5 K/uL   RBC 3.23 (L) 3.87 - 5.11 MIL/uL   Hemoglobin 8.8 (L) 12.0 - 15.0 g/dL   HCT 27.8 (L) 36.0 - 46.0 %   MCV 86.1 78.0 - 100.0 fL   MCH 27.2 26.0 - 34.0 pg   MCHC 31.7 30.0 - 36.0 g/dL   RDW 16.3 (H) 11.5 - 15.5 %   Platelets 272 150 - 400 K/uL   Neutrophils Relative % 89 %   Neutro Abs 15.9 (H) 1.7 - 7.7 K/uL   Lymphocytes Relative 7 %   Lymphs Abs 1.3 0.7 - 4.0 K/uL   Monocytes Relative 3 %   Monocytes Absolute 0.6 0.1 - 1.0 K/uL   Eosinophils Relative 1 %   Eosinophils Absolute 0.2 0.0 - 0.7 K/uL   Basophils Relative 0 %   Basophils Absolute 0.0 0.0 - 0.1 K/uL  Basic metabolic panel     Status: Abnormal   Collection Time: 12/04/15  1:04 AM  Result Value Ref Range   Sodium 138 135 - 145 mmol/L   Potassium 3.7 3.5 - 5.1 mmol/L   Chloride 100 (L) 101 - 111 mmol/L   CO2 24 22 - 32 mmol/L   Glucose, Bld 176 (H) 65 - 99 mg/dL   BUN 35 (H) 6 - 20 mg/dL   Creatinine, Ser 7.43 (H) 0.44 - 1.00 mg/dL   Calcium 9.8 8.9 - 10.3 mg/dL   GFR calc  non Af Amer 6 (L) >60 mL/min   GFR calc Af Amer 7 (L) >60 mL/min    Comment: (NOTE) The eGFR has been calculated using the CKD EPI equation. This calculation has not been validated in all clinical situations. eGFR's persistently <60 mL/min signify possible Chronic Kidney Disease.    Anion gap 14 5 - 15  Culture, blood (Routine X 2) w Reflex to ID Panel     Status: None (Preliminary result)   Collection Time: 12/04/15  2:55 AM  Result Value Ref Range   Specimen Description RIGHT ANTECUBITAL    Special Requests BOTTLES DRAWN AEROBIC ONLY 6CC    Culture NO GROWTH < 12 HOURS    Report Status PENDING   Culture, blood (Routine X 2) w Reflex to ID Panel     Status: None (Preliminary result)   Collection Time: 12/04/15  3:00 AM  Result Value Ref Range   Specimen Description BLOOD LEFT HAND    Special Requests BOTTLES DRAWN AEROBIC ONLY 6CC    Culture NO GROWTH < 12 HOURS    Report Status  PENDING     Dg Chest 2 View  12/04/2015  CLINICAL DATA:  Shortness of breath for 2 days. Cough for 3 months. Hemoptysis. EXAM: CHEST  2 VIEW COMPARISON:  09/27/2012 FINDINGS: Dual lumen right-sided dialysis catheter, tip in the mid SVC. Heart is enlarged. Ill-defined left greater than right perihilar opacities. Question of small bilateral pleural effusions. Mild vascular congestion. No pneumothorax. No acute osseous abnormalities. IMPRESSION: Cardiomegaly. Bilateral perihilar opacities, left greater than right, pneumonia versus pulmonary edema. Probable small pleural effusions. Electronically Signed   By: Jeb Levering M.D.   On: 12/04/2015 01:47    Review of Systems  Constitutional: Positive for chills. Negative for fever.  HENT: Positive for sore throat.   Respiratory: Positive for cough, hemoptysis, sputum production and shortness of breath.   Cardiovascular: Positive for orthopnea. Negative for chest pain and leg swelling.  Gastrointestinal: Negative for nausea and vomiting.   Blood pressure 183/93, pulse 81, temperature 99 F (37.2 C), temperature source Oral, resp. rate 29, height 5' 3"  (1.6 m), weight 228 lb (103.42 kg), SpO2 94 %. Physical Exam  Constitutional: She is oriented to person, place, and time. No distress.  Eyes: No scleral icterus.  Neck: No JVD present.  Cardiovascular: Normal rate and regular rhythm.   No murmur heard. Respiratory: No respiratory distress. She has no wheezes. She has rales.  GI: She exhibits no distension. There is no tenderness.  Musculoskeletal: She exhibits no edema.  Neurological: She is alert and oriented to person, place, and time.    Assessment/Plan: Problem #1 cough: Possibly pneumonia which is bilateral. Left is greater than right. At this moment patient is on antibiotics. She is a febrile but her white blood cell count is high. Problem #2 end-stage renal disease: She is status post hemodialysis on Friday. Presently her potassium is  normal. Patient does not have any uremic signs and symptoms. Problem #3 anemia: Her hemoglobin is below target goal Problem #4 hypertension: Her blood pressure is slightly higher than normal Problem #5 diabetes Problem #6 metabolic bone disease: Her calcium is in  range. Phosphorus is not available. Patient is on PhosLo as a binder. Plan: We'll make arrangements for patient to get dialysis today and possibly remove about 3 L if her blood pressure tolerates. We'll check her basic metabolic panel, CBC and phosphorus in the morning.  Alcus Bradly S 12/04/2015, 9:56 AM

## 2015-12-05 LAB — CBC
HEMATOCRIT: 27.3 % — AB (ref 36.0–46.0)
Hemoglobin: 8.4 g/dL — ABNORMAL LOW (ref 12.0–15.0)
MCH: 26.7 pg (ref 26.0–34.0)
MCHC: 30.8 g/dL (ref 30.0–36.0)
MCV: 86.7 fL (ref 78.0–100.0)
PLATELETS: 283 10*3/uL (ref 150–400)
RBC: 3.15 MIL/uL — ABNORMAL LOW (ref 3.87–5.11)
RDW: 16.4 % — AB (ref 11.5–15.5)
WBC: 10.3 10*3/uL (ref 4.0–10.5)

## 2015-12-05 LAB — BASIC METABOLIC PANEL
ANION GAP: 8 (ref 5–15)
BUN: 25 mg/dL — AB (ref 6–20)
CHLORIDE: 101 mmol/L (ref 101–111)
CO2: 28 mmol/L (ref 22–32)
CREATININE: 5.88 mg/dL — AB (ref 0.44–1.00)
Calcium: 9.3 mg/dL (ref 8.9–10.3)
GFR calc non Af Amer: 8 mL/min — ABNORMAL LOW (ref >60)
GFR, EST AFRICAN AMERICAN: 9 mL/min — AB (ref >60)
GLUCOSE: 241 mg/dL — AB (ref 65–99)
POTASSIUM: 4.1 mmol/L (ref 3.5–5.1)
Sodium: 137 mmol/L (ref 135–145)

## 2015-12-05 LAB — PHOSPHORUS: Phosphorus: 4.3 mg/dL (ref 2.5–4.6)

## 2015-12-05 LAB — GLUCOSE, CAPILLARY
GLUCOSE-CAPILLARY: 212 mg/dL — AB (ref 65–99)
GLUCOSE-CAPILLARY: 212 mg/dL — AB (ref 65–99)
GLUCOSE-CAPILLARY: 97 mg/dL (ref 65–99)
GLUCOSE-CAPILLARY: 162 mg/dL — AB (ref 65–99)

## 2015-12-05 LAB — HEPATITIS B SURFACE ANTIGEN: Hepatitis B Surface Ag: NEGATIVE

## 2015-12-05 MED ORDER — LABETALOL HCL 200 MG PO TABS
300.0000 mg | ORAL_TABLET | Freq: Two times a day (BID) | ORAL | Status: DC
  Administered 2015-12-05 – 2015-12-06 (×2): 300 mg via ORAL
  Filled 2015-12-05 (×2): qty 2

## 2015-12-05 MED ORDER — HYDROCOD POLST-CPM POLST ER 10-8 MG/5ML PO SUER
5.0000 mL | Freq: Two times a day (BID) | ORAL | Status: DC | PRN
  Administered 2015-12-05: 5 mL via ORAL
  Filled 2015-12-05 (×2): qty 5

## 2015-12-05 MED ORDER — SODIUM CHLORIDE 0.9 % IV SOLN: 100.0000 mL | INTRAVENOUS | Status: DC | PRN

## 2015-12-05 MED ORDER — SODIUM CHLORIDE 0.9 % IJ SOLN
INTRAMUSCULAR | Status: AC
  Administered 2015-12-05: 10 mL via INTRAVENOUS
  Filled 2015-12-05: qty 12

## 2015-12-05 MED ORDER — NIFEDIPINE ER OSMOTIC RELEASE 30 MG PO TB24
60.0000 mg | ORAL_TABLET | Freq: Every day | ORAL | Status: DC
  Administered 2015-12-06: 60 mg via ORAL
  Filled 2015-12-05: qty 2

## 2015-12-05 MED ORDER — HEPARIN SODIUM (PORCINE) 1000 UNIT/ML IJ SOLN
INTRAMUSCULAR | Status: AC
  Administered 2015-12-05: 3400 [IU] via INTRAVENOUS_CENTRAL
  Filled 2015-12-05: qty 6

## 2015-12-05 MED ORDER — INSULIN DETEMIR 100 UNIT/ML ~~LOC~~ SOLN
5.0000 [IU] | Freq: Every day | SUBCUTANEOUS | Status: DC
  Administered 2015-12-05: 5 [IU] via SUBCUTANEOUS
  Filled 2015-12-05 (×2): qty 0.05

## 2015-12-05 NOTE — Progress Notes (Signed)
[facility] PROGRESS NOTE  Mackenize Delgadillo RUE:[id number] DOB: [birth year] DOA: 12/04/2015 PCP: [name]  Assessment/Plan: HCAP -Continue vanc/cefepime. -All cx data negative to date.  ESRD -Appreciate renal assistance.  Pulmonary Edema -Had extra HD session 12/18.   HTN -Uncontrolled. -Continue labetalol and nifedipine. -Should improve with HD.  DM -Uncontrolled. -Start levemir 5 units    Code Status: Full Code Family Communication: Husband at bedside updated on plan of care  Disposition Plan: home when ready   Consultants:  Nephrology   Antibiotics:  Vanc  Cefepime   Subjective: Complains of headache  Objective: Filed Vitals:   12/05/15 1400 12/05/15 1430 12/05/15 1500 12/05/15 1530  BP: 212/103 210/112 196/100 184/94  Pulse: 84 84 81 78  Temp:      TempSrc:      Resp:      Height:      Weight:      SpO2:        Intake/Output Summary (Last 24 hours) at 12/05/15 1544 Last data filed at 12/05/15 0858  Gross per 24 hour  Intake    120 ml  Output   3353 ml  Net  -3233 ml   Filed Weights   12/04/15 1325 12/04/15 1739 12/05/15 1320  Weight: 104.9 kg (231 lb 4.2 oz) 101.5 kg (223 lb 12.3 oz) 101.7 kg (224 lb 3.3 oz)    Exam:   General:  Alert, awake, oriented 3  Cardiovascular: Regular rate and rhythm  Respiratory: Mild bilateral rhonchi  Abdomen: Obese, soft, nontender, nondistended, positive bowel sounds  Extremities: 1+ pitting edema bilaterally  Neurologic:  Grossly intact and nonfocal  Data Reviewed: Basic Metabolic Panel:  Recent Labs Lab 12/04/15 0104 12/05/15 0604 12/05/15 0605  NA 138 137  --   K 3.7 4.1  --   CL 100* 101  --   CO2 24 28  --   GLUCOSE 176* 241*  --   BUN 35* 25*  --   CREATININE 7.43* 5.88*  --   CALCIUM 9.8 9.3  --   PHOS  --   --  4.3   Liver Function Tests: No results for input(s): AST, ALT, ALKPHOS, BILITOT, PROT, ALBUMIN in the last 168 hours. No results for  input(s): LIPASE, AMYLASE in the last 168 hours. No results for input(s): AMMONIA in the last 168 hours. CBC:  Recent Labs Lab 12/04/15 0104 12/05/15 0604  WBC 18.0* 10.3  NEUTROABS 15.9*  --   HGB 8.8* 8.4*  HCT 27.8* 27.3*  MCV 86.1 86.7  PLT 272 283   Cardiac Enzymes: No results for input(s): CKTOTAL, CKMB, CKMBINDEX, TROPONINI in the last 168 hours. BNP (last 3 results) No results for input(s): BNP in the last 8760 hours.  ProBNP (last 3 results) No results for input(s): PROBNP in the last 8760 hours.  CBG:  Recent Labs Lab 12/04/15 2122 12/05/15 0801 12/05/15 1147  GLUCAP 251* 212* 212*    Recent Results (from the past 240 hour(s))  Culture, blood (Routine X 2) w Reflex to ID Panel     Status: None (Preliminary result)   Collection Time: 12/04/15  2:55 AM  Result Value Ref Range Status   Specimen Description RIGHT ANTECUBITAL  Final   Special Requests BOTTLES DRAWN AEROBIC ONLY 6CC  Final   Culture NO GROWTH 1 DAY  Final   Report Status PENDING  Incomplete  Culture, blood (Routine X 2) w Reflex to ID Panel     Status: None (Preliminary result)  Collection Time: 12/04/15  3:00 AM  Result Value Ref Range Status   Specimen Description BLOOD LEFT HAND  Final   Special Requests BOTTLES DRAWN AEROBIC ONLY [place]  Final   Culture NO GROWTH 1 DAY  Final   Report Status PENDING  Incomplete     Studies: Dg Chest 2 View  12/04/2015  CLINICAL DATA:  Shortness of breath for 2 days. Cough for 3 months. Hemoptysis. EXAM: CHEST  2 VIEW COMPARISON:  09/27/2012 FINDINGS: Dual lumen right-sided dialysis catheter, tip in the mid SVC. Heart is enlarged. Ill-defined left greater than right perihilar opacities. Question of small bilateral pleural effusions. Mild vascular congestion. No pneumothorax. No acute osseous abnormalities. IMPRESSION: Cardiomegaly. Bilateral perihilar opacities, left greater than right, pneumonia versus pulmonary edema. Probable small pleural effusions.  Electronically Signed   By: Jeb Levering M.D.   On: 12/04/2015 01:47    Scheduled Meds: . aspirin EC  81 mg Oral Daily  . calcium acetate  667 mg Oral TID WC  . ceFEPime (MAXIPIME) IV  2 g Intravenous Q M,W,F-HD  . furosemide  120 mg Oral BID  . heparin      . heparin  5,000 Units Subcutaneous 3 times per day  . insulin aspart  0-15 Units Subcutaneous TID WC  . insulin aspart  0-5 Units Subcutaneous QHS  . insulin aspart  4 Units Subcutaneous TID WC  . labetalol  300 mg Oral BID  . NIFEdipine  60 mg Oral Daily  . sodium chloride  3 mL Intravenous Q12H  . sodium chloride      . vancomycin  1,000 mg Intravenous Q M,W,F-HD   Continuous Infusions:   Active Problems:   HCAP (healthcare-associated pneumonia)   ESRD on dialysis ([place])   Diabetes mellitus ([place])    Time spent: 25 minutes. Greater than 50% of this time was spent in direct contact with the patient coordinating care.    Lelon Frohlich  [facility] Pager [phone]  If 7PM-7AM, please contact night-coverage at [email or web address], password [facility] 12/05/2015, 3:44 PM  LOS: 1 day

## 2015-12-05 NOTE — Procedures (Signed)
   HEMODIALYSIS TREATMENT NOTE:  3 hour heparin-free dialysis completed via right chest wall tunneled catheter. Goal NOT met: Pt unable to tolerate removal of 2 liters as ordered. Ultrafiltration was interrupted x 18 minutes and NS boluses were given for c/o muscle cramps. Net UF 1.8 liters. Vancomycin and Cefepime given with HD. All blood was returned. Report called to [facility], LPN.  Rockwell [place], RN, CDN

## 2015-12-05 NOTE — Progress Notes (Signed)
Inpatient Diabetes Program Recommendations  AACE/ADA: New Consensus Statement on Inpatient Glycemic Control (2015)  Target Ranges:  Prepandial:   less than 140 mg/dL      Peak postprandial:   less than 180 mg/dL (1-2 hours)      Critically ill patients:  140 - 180 mg/dL  Results for DORALEE, KOCAK (MRN [id number]) as of 12/05/2015 08:43  Ref. Range 12/04/2015 21:22 12/05/2015 08:01  Glucose-Capillary Latest Ref Range: 65-99 mg/dL 251 (H) 212 (H)   Review of Glycemic Control  Diabetes history: DM2 Outpatient Diabetes medications: Glipizide 5 mg BID Current orders for Inpatient glycemic control: Novolog 0-15 units TID with meals, Novolog 0-5 units HS, Novolog 4 units TID with meals  Inpatient Diabetes Program Recommendations: Insulin - Basal: While inpatient, please consider ordering low dose basal insulin. Recommend starting with Levemir 5 units daily.  Thanks, Barnie Alderman, RN, MSN, CDE Diabetes Coordinator Inpatient Diabetes Program [phone] (Team Pager from [place] to [place]) [phone] (AP office) [phone] [facility] office) [phone] [facility] office)

## 2015-12-05 NOTE — Progress Notes (Signed)
Subjective: Presently patient is still have cough but much better. Her breathing is also has improved. Patient presently complains of headache otherwise no other issues.   Objective: Vital signs in last 24 hours: Temp:  [97.6 F (36.4 C)-98.7 F (37.1 C)] 98.3 F (36.8 C) (12/19 0500) Pulse Rate:  [74-88] 78 (12/19 0500) Resp:  [19-20] 20 (12/19 0500) BP: (162-247)/(74-116) 176/82 mmHg (12/19 0500) SpO2:  [88 %-96 %] 92 % (12/19 0500) Weight:  [223 lb 12.3 oz (101.5 kg)-231 lb 4.2 oz (104.9 kg)] 223 lb 12.3 oz (101.5 kg) (12/18 1739)  Intake/Output from previous day: 12/18 0701 - 12/19 0700 In: -  Out: 3353 [Urine:300] Intake/Output this shift:     Recent Labs  12/04/15 0104 12/05/15 0604  HGB 8.8* 8.4*    Recent Labs  12/04/15 0104 12/05/15 0604  WBC 18.0* 10.3  RBC 3.23* 3.15*  HCT 27.8* 27.3*  PLT 272 283    Recent Labs  12/04/15 0104 12/05/15 0604  NA 138 137  K 3.7 4.1  CL 100* 101  CO2 24 28  BUN 35* 25*  CREATININE 7.43* 5.88*  GLUCOSE 176* 241*  CALCIUM 9.8 9.3   No results for input(s): LABPT, INR in the last 72 hours.  Generally patient is alert no apparent distress Chest clear to auscultation, no rales rhonchi or egophony Heart exam regular rate and rhythm no murmur. Extremities no edema  Assessment/Plan: Problem #1 difficulty breathing: Possibly a combination of pneumonia and CHF. Patient was dialyzed yesterday and we're able to remove about 3 L. Presently she is feeling much better. Today is her regular dialysis day. Problem #2 anemia: Her hemoglobin is below our target goal Problem #3 hypertension: Her blood pressure is not controlled very well. Patient is on nifedipine XL and labetalol. Problem #4. Metabolic bone disease: Her calcium is in range Problem #5 diabetes Problem #6 history of pneumonia. Patient is afebrile. Her white blood cell count has returned to normal level. Plan: We'll make arrangements for patient to get dialysis 3  hours 2] will remove 2 L if her blood pressure tolerates 3] will increase labetalol to 300 mg by mouth twice a day 4] will increase nifedipine XL to 60 mg by mouth once a day.   Deondra Labrador S 12/05/2015, 7:49 AM

## 2015-12-06 LAB — GLUCOSE, CAPILLARY
GLUCOSE-CAPILLARY: 202 mg/dL — AB (ref 65–99)
Glucose-Capillary: 138 mg/dL — ABNORMAL HIGH (ref 65–99)

## 2015-12-06 LAB — HEMOGLOBIN A1C
Hgb A1c MFr Bld: 6.8 % — ABNORMAL HIGH (ref 4.8–5.6)
MEAN PLASMA GLUCOSE: 148 mg/dL

## 2015-12-06 MED ORDER — VANCOMYCIN HCL IN DEXTROSE 1-5 GM/200ML-% IV SOLN: 1000.0000 mg | INTRAVENOUS | Status: AC

## 2015-12-06 MED ORDER — HYDRALAZINE HCL 25 MG PO TABS
25.0000 mg | ORAL_TABLET | Freq: Four times a day (QID) | ORAL | Status: DC | PRN
  Administered 2015-12-06: 25 mg via ORAL
  Filled 2015-12-06: qty 1

## 2015-12-06 MED ORDER — NIFEDIPINE ER 60 MG PO TB24: 60.0000 mg | ORAL_TABLET | Freq: Every day | ORAL | Status: DC

## 2015-12-06 MED ORDER — CEFTAZIDIME 2 G IV SOLR
2.0000 g | INTRAVENOUS | Status: AC | PRN
Start: 2015-12-06 — End: 2015-12-13

## 2015-12-06 MED ORDER — HYDRALAZINE HCL 20 MG/ML IJ SOLN: 25.0000 mg | Freq: Four times a day (QID) | INTRAMUSCULAR | Status: DC | PRN

## 2015-12-06 MED ORDER — LABETALOL HCL 300 MG PO TABS: 300.0000 mg | ORAL_TABLET | Freq: Two times a day (BID) | ORAL | Status: DC

## 2015-12-06 MED ORDER — DEXTROSE 5 % IV SOLN: 2.0000 g | INTRAVENOUS | Status: DC

## 2015-12-06 NOTE — Progress Notes (Signed)
Subjective: Patient is progressively improving. He has intermittent cough but is much better. Presently she complains of no other stuffiness. No nausea or vomiting.   Objective: Vital signs in last 24 hours: Temp:  [97.9 F (36.6 C)-98.5 F (36.9 C)] 98.5 F (36.9 C) (12/20 0604) Pulse Rate:  [78-84] 80 (12/20 0604) Resp:  [17-20] 17 (12/20 0604) BP: (170-212)/(81-112) 192/81 mmHg (12/20 0604) SpO2:  [93 %-98 %] 96 % (12/20 0604) Weight:  [219 lb 5.7 oz (99.5 kg)-224 lb 3.3 oz (101.7 kg)] 219 lb 5.7 oz (99.5 kg) (12/19 1632)  Intake/Output from previous day: 12/19 0701 - 12/20 0700 In: 600 [P.O.:600] Out: 1800  Intake/Output this shift:     Recent Labs  12/04/15 0104 12/05/15 0604  HGB 8.8* 8.4*    Recent Labs  12/04/15 0104 12/05/15 0604  WBC 18.0* 10.3  RBC 3.23* 3.15*  HCT 27.8* 27.3*  PLT 272 283    Recent Labs  12/04/15 0104 12/05/15 0604  NA 138 137  K 3.7 4.1  CL 100* 101  CO2 24 28  BUN 35* 25*  CREATININE 7.43* 5.88*  GLUCOSE 176* 241*  CALCIUM 9.8 9.3   No results for input(s): LABPT, INR in the last 72 hours.  Generally patient is alert no apparent distress Chest clear to auscultation, no rales rhonchi or egophony Heart exam regular rate and rhythm no murmur. Extremities no edema  Assessment/Plan: Problem #1 difficulty breathing: Possibly a combination of pneumonia and CHF. Patient is feeling much better. We were able to remove about 1800 mL yesterday. Totally wearable to remove about 5 L the last 2 dialysis treatment. Problem #2 anemia: Her hemoglobin is below our target goal: Patient is on Epogen Problem #3 hypertension: Her blood pressure is not controlled very well. Her labetalol was increased to 300 mg by mouth twice a day and nifedipine was also increased to 60 mg once a day. Still her blood pressure is high. Problem #4. Metabolic bone disease: Her calcium and phosphorus is in range Problem #5 diabetes Problem #6 history of pneumonia.  Patient is afebrile. Her white blood cell count has returned to normal level. Patient is a febrile, white blood cell count is normal and blood culture no growth. Plan: We'll make arrangements for patient to get dialysis tomorrow which is her regular schedule. 2] will remove 2 L if her blood pressure tolerates 3] will continue with labetalol 300 mg by mouth twice a day 4] will increase nifedipine XL to 90 mg by mouth once a day. 5] will check her CBC and basic metabolic panel in the morning.    Patricia [name] S 12/06/2015, 7:59 AM

## 2015-12-06 NOTE — Discharge Summary (Addendum)
Physician Discharge Summary  Patricia [name] SEG:[id number] DOB: 24-Jun-1971 DOA: 12/04/2015  PCP: [name]  Admit date: 12/04/2015 Discharge date: 12/06/2015  Time spent: 45 minutes  Recommendations for Outpatient Follow-up:  -We'll be discharge home today. -We'll need to continue antibiotics for healthcare associated pneumonia, vancomycin and ceftazidime, until 12/27 with dialysis.   Discharge Diagnoses:  Active Problems:   HCAP (healthcare-associated pneumonia)   ESRD on dialysis ([place])   Diabetes mellitus ([place])   Discharge Condition: Stable and improved  Filed Weights   12/04/15 1739 12/05/15 1320 12/05/15 1632  Weight: 101.5 kg (223 lb 12.3 oz) 101.7 kg (224 lb 3.3 oz) 99.5 kg (219 lb 5.7 oz)    History of present illness:   As per Dr. Darrick Meigs 82/35: 44 year old female who  has a past medical history of Hypertension; Diabetes mellitus without complication ([place]); Blood transfusion without reported diagnosis; Dialysis patient [facility]); and Renal disorder. Today presents to the hospital with worsening shortness of breath for past 2 days. Patient has and stage renal disease and is on hemodialysis Monday Wednesday Friday. Patient has been coughing since September and was seen by PCP and nephrologist, they could not find anything wrong with patient. She also was diagnosed with UTI and PCP prescribed amoxicillin for a week. This morning patient when she coughed up she had dark streaks of blood in the sputum. Patient denies any fever or chills. She was dialyzed last week on Friday. She also complains of pain in the left side of the neck, patient has tympanostomy tube in that ear. In the ED chest x-ray showed bilateral infiltrates pulmonary edema versus pneumonia. Patient empirically started on vancomycin and cefepime. Blood cultures 2 have been obtained.  Hospital Course:   HCAP -Continue vanc/ceftazidime until 12/27. -Symptomatically improved without oxygen  requirements. -All cx data negative to date.  ESRD -Appreciate renal assistance.  Pulmonary Edema -Had extra HD session 12/18. -Resolved.  HTN -Uncontrolled. -Continue labetalol and nifedipine at increased doses. -Continue monitoring station at dialysis center. -Should improve with HD.  DM -Uncontrolled. -Start levemir 5 units   Procedures:   None   Consultations:   Renal, Dr. Hinda Lenis  Discharge Instructions      Discharge Instructions    Diet - low sodium heart healthy    Complete by:  As directed      Increase activity slowly    Complete by:  As directed             Medication List    TAKE these medications        aspirin 81 MG tablet  Take 81 mg by mouth daily.     benzonatate 100 MG capsule  Commonly known as:  TESSALON  Take 100 mg by mouth 3 (three) times daily as needed for cough.     calcium acetate 667 MG capsule  Commonly known as:  PHOSLO  Take 1,334 mg by mouth 3 (three) times daily with meals.     Ceftazidime 2 G Solr injection  Commonly known as:  FORTAZ  Inject 2 g into the vein every dialysis.     DIALYVITE 800 0.8 MG Tabs  Take 1 tablet by mouth daily.     furosemide 40 MG tablet  Commonly known as:  LASIX  Take 120 mg by mouth 2 (two) times daily.     glipiZIDE 5 MG tablet  Commonly known as:  GLUCOTROL  Take 5 mg by mouth 2 (two) times daily before a meal.  labetalol 300 MG tablet  Commonly known as:  NORMODYNE  Take 1 tablet (300 mg total) by mouth 2 (two) times daily.     NIFEdipine 60 MG 24 hr tablet  Commonly known as:  PROCARDIA-XL/ADALAT CC  Take 1 tablet (60 mg total) by mouth daily.     pantoprazole 20 MG tablet  Commonly known as:  PROTONIX  Take 1 tablet by mouth daily.     vancomycin 1 GM/200ML Soln  Commonly known as:  VANCOCIN  Inject 200 mLs (1,000 mg total) into the vein every Monday, Wednesday, and Friday with hemodialysis.       Allergies  Allergen Reactions  . Reglan [Metoclopramide]  Other (See Comments)    hallucinations  . Amlodipine Besylate Rash and Other (See Comments)    dizziness      The results of significant diagnostics from this hospitalization (including imaging, microbiology, ancillary and laboratory) are listed below for reference.    Significant Diagnostic Studies: Dg Chest 2 View  12/04/2015  CLINICAL DATA:  Shortness of breath for 2 days. Cough for 3 months. Hemoptysis. EXAM: CHEST  2 VIEW COMPARISON:  09/27/2012 FINDINGS: Dual lumen right-sided dialysis catheter, tip in the mid SVC. Heart is enlarged. Ill-defined left greater than right perihilar opacities. Question of small bilateral pleural effusions. Mild vascular congestion. No pneumothorax. No acute osseous abnormalities. IMPRESSION: Cardiomegaly. Bilateral perihilar opacities, left greater than right, pneumonia versus pulmonary edema. Probable small pleural effusions. Electronically Signed   By: Jeb Levering M.D.   On: 12/04/2015 01:47    Microbiology: Recent Results (from the past 240 hour(s))  Culture, blood (Routine X 2) w Reflex to ID Panel     Status: None (Preliminary result)   Collection Time: 12/04/15  2:55 AM  Result Value Ref Range Status   Specimen Description BLOOD RIGHT ANTECUBITAL DRAWN BY RN  Final   Special Requests BOTTLES DRAWN AEROBIC ONLY 6CC  Final   Culture NO GROWTH 2 DAYS  Final   Report Status PENDING  Incomplete  Culture, blood (Routine X 2) w Reflex to ID Panel     Status: None (Preliminary result)   Collection Time: 12/04/15  3:00 AM  Result Value Ref Range Status   Specimen Description BLOOD LEFT HAND  Final   Special Requests BOTTLES DRAWN AEROBIC ONLY 6CC  Final   Culture NO GROWTH 2 DAYS  Final   Report Status PENDING  Incomplete     Labs: Basic Metabolic Panel:  Recent Labs Lab 12/04/15 0104 12/05/15 0604 12/05/15 0605  NA 138 137  --   K 3.7 4.1  --   CL 100* 101  --   CO2 24 28  --   GLUCOSE 176* 241*  --   BUN 35* 25*  --   CREATININE  7.43* 5.88*  --   CALCIUM 9.8 9.3  --   PHOS  --   --  4.3   Liver Function Tests: No results for input(s): AST, ALT, ALKPHOS, BILITOT, PROT, ALBUMIN in the last 168 hours. No results for input(s): LIPASE, AMYLASE in the last 168 hours. No results for input(s): AMMONIA in the last 168 hours. CBC:  Recent Labs Lab 12/04/15 0104 12/05/15 0604  WBC 18.0* 10.3  NEUTROABS 15.9*  --   HGB 8.8* 8.4*  HCT 27.8* 27.3*  MCV 86.1 86.7  PLT 272 283   Cardiac Enzymes: No results for input(s): CKTOTAL, CKMB, CKMBINDEX, TROPONINI in the last 168 hours. BNP: BNP (last 3 results) No results for  input(s): BNP in the last 8760 hours.  ProBNP (last 3 results) No results for input(s): PROBNP in the last 8760 hours.  CBG:  Recent Labs Lab 12/05/15 1147 12/05/15 1754 12/05/15 2152 12/06/15 0739 12/06/15 1159  GLUCAP 212* 97 162* 138* 202*       Signed:  [facility] Pager: [phone] 12/06/2015, 1:17 PM

## 2015-12-06 NOTE — Progress Notes (Signed)
Spoke with Dr.Lama wyho gave a verbal order for 25 mg of Hydralazine Q6 PRN for blood pressures greater than 160/100. Will continue to monitor

## 2015-12-06 NOTE — Progress Notes (Signed)
Patient discharged with instructions given on medications,and follow up visits,patient verbalized understanding. Prescriptions sent to Pharmacy of choice documented on AVS. No c/o pain or discomfort noted. Accompanied by staff to an awaiting vehicle.

## 2015-12-06 NOTE — Care Management Note (Signed)
Case Management Note  Patient Details  Name: Patricia [name] MRN: [id number] Date of Birth: [birth year]  Subjective/Objective:                  Pt admitted from home with pneumonia. Pt lives with her husband and will return home at discharge. Pt is independent with ADL's. Pt receives dialysis at [facility] in [place].   Action/Plan: CM faxed [place] IV AB orders. No further CM needs noted.  Expected Discharge Date:                  Expected Discharge Plan:  Home/Self Care  In-House Referral:  NA  Discharge planning Services  CM Consult  Post Acute Care Choice:  NA Choice offered to:  NA  DME Arranged:    DME Agency:     HH Arranged:    HH Agency:     Status of Service:  Completed, signed off  Medicare Important Message Given:    Date Medicare IM Given:    Medicare IM give by:    Date Additional Medicare IM Given:    Additional Medicare Important Message give by:     If discussed at [place] [facility] of Stay Meetings, dates discussed:    Additional Comments:  Joylene Draft, RN 12/06/2015, 1:34 PM

## 2015-12-06 NOTE — Progress Notes (Signed)
Pt BP 192/81. Notified Dr.LamA, no new orders at this time. Will continue to monitor

## 2015-12-06 NOTE — Care Management Important Message (Signed)
Important Message  Patient Details  Name: Mazzie Brodrick MRN: [id number] Date of Birth: 1971-02-25   Medicare Important Message Given:  N/A - LOS <3 / Initial given by admissions    Joylene Draft, RN 12/06/2015, 1:36 PM

## 2015-12-09 LAB — CULTURE, BLOOD (ROUTINE X 2)
Culture: NO GROWTH
Culture: NO GROWTH

## 2015-12-30 DIAGNOSIS — N2581 Secondary hyperparathyroidism of renal origin: Secondary | ICD-10-CM | POA: Diagnosis not present

## 2015-12-30 DIAGNOSIS — N186 End stage renal disease: Secondary | ICD-10-CM | POA: Diagnosis not present

## 2015-12-30 DIAGNOSIS — Z992 Dependence on renal dialysis: Secondary | ICD-10-CM | POA: Diagnosis not present

## 2016-01-04 DIAGNOSIS — Z992 Dependence on renal dialysis: Secondary | ICD-10-CM | POA: Diagnosis not present

## 2016-01-04 DIAGNOSIS — N2581 Secondary hyperparathyroidism of renal origin: Secondary | ICD-10-CM | POA: Diagnosis not present

## 2016-01-04 DIAGNOSIS — N186 End stage renal disease: Secondary | ICD-10-CM | POA: Diagnosis not present

## 2016-01-11 DIAGNOSIS — N2581 Secondary hyperparathyroidism of renal origin: Secondary | ICD-10-CM | POA: Diagnosis not present

## 2016-01-11 DIAGNOSIS — Z992 Dependence on renal dialysis: Secondary | ICD-10-CM | POA: Diagnosis not present

## 2016-01-11 DIAGNOSIS — N186 End stage renal disease: Secondary | ICD-10-CM | POA: Diagnosis not present

## 2016-01-19 DIAGNOSIS — Z992 Dependence on renal dialysis: Secondary | ICD-10-CM | POA: Diagnosis not present

## 2016-01-19 DIAGNOSIS — Z1389 Encounter for screening for other disorder: Secondary | ICD-10-CM | POA: Diagnosis not present

## 2016-01-19 DIAGNOSIS — Z6841 Body Mass Index (BMI) 40.0 and over, adult: Secondary | ICD-10-CM | POA: Diagnosis not present

## 2016-01-19 DIAGNOSIS — E1129 Type 2 diabetes mellitus with other diabetic kidney complication: Secondary | ICD-10-CM | POA: Diagnosis not present

## 2016-01-26 DIAGNOSIS — Z992 Dependence on renal dialysis: Secondary | ICD-10-CM | POA: Diagnosis not present

## 2016-01-26 DIAGNOSIS — N186 End stage renal disease: Secondary | ICD-10-CM | POA: Diagnosis not present

## 2016-02-27 DIAGNOSIS — Z7682 Awaiting organ transplant status: Secondary | ICD-10-CM | POA: Diagnosis not present

## 2016-03-08 ENCOUNTER — Encounter: Payer: Self-pay | Admitting: *Deleted

## 2016-03-09 ENCOUNTER — Ambulatory Visit (INDEPENDENT_AMBULATORY_CARE_PROVIDER_SITE_OTHER): Payer: PRIVATE HEALTH INSURANCE | Admitting: Cardiovascular Disease

## 2016-03-09 ENCOUNTER — Encounter: Payer: Self-pay | Admitting: *Deleted

## 2016-03-09 ENCOUNTER — Encounter: Payer: Self-pay | Admitting: Cardiovascular Disease

## 2016-03-09 VITALS — BP 137/78 | HR 83 | Ht 63.0 in | Wt 230.0 lb

## 2016-03-09 DIAGNOSIS — R9431 Abnormal electrocardiogram [ECG] [EKG]: Secondary | ICD-10-CM | POA: Diagnosis not present

## 2016-03-09 DIAGNOSIS — Z01818 Encounter for other preprocedural examination: Secondary | ICD-10-CM

## 2016-03-09 NOTE — Patient Instructions (Signed)
Your physician has requested that you have a lexiscan myoview. For further information please visit [email or web address]. Please follow instruction sheet, as given. Office will contact with results via phone or letter.   Continue all current medications. Follow up to be determined.

## 2016-03-09 NOTE — Progress Notes (Signed)
Patient ID: Patricia [name], female   DOB: 31-May-1971, 45 y.o.   MRN: [id number]       CARDIOLOGY CONSULT NOTE  Patient ID: Patricia [name] MRN: [id number] DOB/AGE: 1971/09/11 45 y.o.  Admit date: (Not on file) Primary Physician [name]  Reason for Consultation: preoperative clearance  HPI: The patient is a 45 yr old woman with a h/o ESRD on hemodialysis, hypertension, and diabetes mellitus. She is being evaluated for renal transplantation.  She underwent stress testing in January 2017 and achieved 5.1 METS with an attenuated heart rate response secondary to medication. She was also hypoglycemic. She had an exaggerated hypertensive response with blood pressure peaking at 230/55 after 1 minute of exercise. Target heart rate achieved was 147 bpm with a maximum corrected heart rate of 174 bpm. It was deemed a nondiagnostic study due to an inadequate heart rate.  Echocardiography on 01/10/16 demonstrated normal left ventricular systolic function with moderate LVH, diastolic dysfunction with elevated left atrial pressures, and no significant valvular pathology.  ECG in December 2016 demonstrated sinus rhythm with nonspecific T-wave abnormalities in lateral leads.  The patient denies any symptoms of chest pain, palpitations, shortness of breath, lightheadedness, dizziness, leg swelling, orthopnea, PND, and syncope.    Allergies  Allergen Reactions  . Reglan [Metoclopramide] Other (See Comments)    hallucinations  . Amlodipine Besylate Rash and Other (See Comments)    dizziness    Current Outpatient Prescriptions  Medication Sig Dispense Refill  . aspirin 81 MG tablet Take 81 mg by mouth daily.    . B Complex-C-Folic Acid (DIALYVITE 937) 0.8 MG TABS Take 1 tablet by mouth daily.    . furosemide (LASIX) 40 MG tablet Take 120 mg by mouth 2 (two) times daily.    Marland Kitchen glipiZIDE (GLUCOTROL) 5 MG tablet Take 5 mg by mouth 2 (two) times daily before a meal.    . labetalol (NORMODYNE)  300 MG tablet Take 1 tablet (300 mg total) by mouth 2 (two) times daily. 60 tablet 2  . NIFEdipine (PROCARDIA-XL/ADALAT CC) 60 MG 24 hr tablet Take 1 tablet (60 mg total) by mouth daily. 30 tablet 2  . pantoprazole (PROTONIX) 20 MG tablet Take 1 tablet by mouth daily.  1  . sevelamer carbonate (RENVELA) 800 MG tablet Take 2,400 mg by mouth 3 (three) times daily with meals. & 800 mg with snacks     No current facility-administered medications for this visit.    Past Medical History  Diagnosis Date  . Hypertension   . Diabetes mellitus without complication ([place])   . Blood transfusion without reported diagnosis   . Dialysis patient ([place])     mon, wed, friday,   . Renal disorder     Past Surgical History  Procedure Laterality Date  . Cholecystectomy    . Cesarean section    . Dilation and curettage of uterus    . Abdominal hysterectomy      Social History   Social History  . Marital Status: Married    Spouse Name: N/A  . Number of Children: N/A  . Years of Education: N/A   Occupational History  . Not on file.   Social History Main Topics  . Smoking status: Never Smoker   . Smokeless tobacco: Never Used  . Alcohol Use: No  . Drug Use: No  . Sexual Activity: Not on file   Other Topics Concern  . Not on file   Social History Narrative     No family history of  premature CAD in 1st degree relatives.  Prior to Admission medications   Medication Sig Start Date End Date Taking? Authorizing Provider  aspirin 81 MG tablet Take 81 mg by mouth daily.   Yes Historical Provider, MD  B Complex-C-Folic Acid (DIALYVITE 574) 0.8 MG TABS Take 1 tablet by mouth daily. 12/02/15  Yes Historical Provider, MD  furosemide (LASIX) 40 MG tablet Take 120 mg by mouth 2 (two) times daily.    Historical Provider, MD  glipiZIDE (GLUCOTROL) 5 MG tablet Take 5 mg by mouth 2 (two) times daily before a meal.    Historical Provider, MD  labetalol (NORMODYNE) 300 MG tablet Take 1 tablet (300 mg  total) by mouth 2 (two) times daily. 12/06/15   Erline Hau, MD  NIFEdipine (PROCARDIA-XL/ADALAT CC) 60 MG 24 hr tablet Take 1 tablet (60 mg total) by mouth daily. 12/06/15   Erline Hau, MD  pantoprazole (PROTONIX) 20 MG tablet Take 1 tablet by mouth daily. 11/28/15   Historical Provider, MD  sevelamer carbonate (RENVELA) 800 MG tablet Take 2,400 mg by mouth 3 (three) times daily with meals. & 800 mg with snacks    Historical Provider, MD     Review of systems complete and found to be negative unless listed above in HPI     Physical exam Height '5\' 3"'$  (1.6 m), weight 230 lb (104.327 kg). General: NAD Neck: No JVD, no thyromegaly or thyroid nodule.  Lungs: Clear to auscultation bilaterally with normal respiratory effort. CV: Nondisplaced PMI. Regular rate and rhythm, normal S1/S2, no S3/S4, no murmur.  No peripheral edema.  No carotid bruit.   Abdomen: Soft, nontender, obese.  Skin: Intact without lesions or rashes.  Neurologic: Alert and oriented x 3.  Psych: Normal affect. HEENT: Normal.   ECG: Most recent ECG reviewed.  Labs:   Lab Results  Component Value Date   WBC 10.3 12/05/2015   HGB 8.4* 12/05/2015   HCT 27.3* 12/05/2015   MCV 86.7 12/05/2015   PLT 283 12/05/2015   No results for input(s): NA, K, CL, CO2, BUN, CREATININE, CALCIUM, PROT, BILITOT, ALKPHOS, ALT, AST, GLUCOSE in the last 168 hours.  Invalid input(s): LABALBU No results found for: CKTOTAL, CKMB, CKMBINDEX, TROPONINI No results found for: CHOL No results found for: HDL No results found for: LDLCALC No results found for: TRIG No results found for: CHOLHDL No results found for: LDLDIRECT       Studies: No results found.  ASSESSMENT AND PLAN:  1. Preoperative risk stratification for renal transplant: She denies any ischemic symptoms. However, occult ischemic heart disease will need to be ruled out. Will proceed with a Lexiscan Cardiolite stress test.   Dispo: f/u to be  determined.   Signed: Kate Sable, M.D., F.A.C.C.  03/09/2016, 1:56 PM

## 2016-03-14 ENCOUNTER — Other Ambulatory Visit: Payer: Self-pay

## 2016-03-19 ENCOUNTER — Other Ambulatory Visit (HOSPITAL_COMMUNITY): Payer: Self-pay | Admitting: *Deleted

## 2016-03-20 ENCOUNTER — Ambulatory Visit (HOSPITAL_COMMUNITY)
Admission: RE | Admit: 2016-03-20 | Discharge: 2016-03-20 | Disposition: A | Discharge status: Home / Self Care | Payer: PRIVATE HEALTH INSURANCE | Source: Ambulatory Visit | Attending: Vascular Surgery | Admitting: Vascular Surgery

## 2016-03-20 DIAGNOSIS — E1122 Type 2 diabetes mellitus with diabetic chronic kidney disease: Secondary | ICD-10-CM | POA: Insufficient documentation

## 2016-03-20 DIAGNOSIS — Z7982 Long term (current) use of aspirin: Secondary | ICD-10-CM | POA: Insufficient documentation

## 2016-03-20 DIAGNOSIS — Z4901 Encounter for fitting and adjustment of extracorporeal dialysis catheter: Secondary | ICD-10-CM | POA: Insufficient documentation

## 2016-03-20 DIAGNOSIS — Z992 Dependence on renal dialysis: Secondary | ICD-10-CM | POA: Diagnosis not present

## 2016-03-20 DIAGNOSIS — N186 End stage renal disease: Secondary | ICD-10-CM | POA: Diagnosis not present

## 2016-03-20 DIAGNOSIS — I120 Hypertensive chronic kidney disease with stage 5 chronic kidney disease or end stage renal disease: Secondary | ICD-10-CM | POA: Insufficient documentation

## 2016-03-20 DIAGNOSIS — Z7984 Long term (current) use of oral hypoglycemic drugs: Secondary | ICD-10-CM | POA: Diagnosis not present

## 2016-03-20 DIAGNOSIS — Z888 Allergy status to other drugs, medicaments and biological substances status: Secondary | ICD-10-CM | POA: Diagnosis not present

## 2016-03-20 DIAGNOSIS — Z79899 Other long term (current) drug therapy: Secondary | ICD-10-CM | POA: Diagnosis not present

## 2016-03-20 MED ORDER — LIDOCAINE HCL (PF) 2 % IJ SOLN
INTRAMUSCULAR | Status: AC
  Filled 2016-03-20: qty 10

## 2016-03-20 NOTE — Progress Notes (Signed)
[facility] SHORT STAY H&P  CC: ESRD   HPI: Patricia [name] is a 45 y.o. female who has been on HD through  functioning Hemodialysis access in the left upper extremity. They are here for HD catheter removal. Pt. denies signs of steal syndrome.  Past Medical History  Diagnosis Date  . Hypertension   . Diabetes mellitus without complication ([place])   . Blood transfusion without reported diagnosis   . Dialysis patient ([place])     mon, wed, friday,   . Renal disorder     Family Hx No family history on file.  Social HX Social History  Substance Use Topics  . Smoking status: Never Smoker   . Smokeless tobacco: Never Used  . Alcohol Use: No    Allergies Allergies  Allergen Reactions  . Reglan [Metoclopramide] Other (See Comments)    hallucinations  . Amlodipine Besylate Rash and Other (See Comments)    dizziness    Medications Current Outpatient Prescriptions  Medication Sig Dispense Refill  . aspirin 81 MG tablet Take 81 mg by mouth daily.    . B Complex-C-Folic Acid (DIALYVITE 740) 0.8 MG TABS Take 1 tablet by mouth daily.    . furosemide (LASIX) 40 MG tablet Take 40 mg by mouth 2 (two) times daily.     Marland Kitchen glipiZIDE (GLUCOTROL) 5 MG tablet Take 5 mg by mouth 2 (two) times daily before a meal.    . labetalol (NORMODYNE) 300 MG tablet Take 1 tablet (300 mg total) by mouth 2 (two) times daily. 60 tablet 2  . NIFEdipine (PROCARDIA-XL/ADALAT CC) 60 MG 24 hr tablet Take 1 tablet (60 mg total) by mouth daily. 30 tablet 2  . pantoprazole (PROTONIX) 20 MG tablet Take 1 tablet by mouth daily as needed.   1  . sevelamer carbonate (RENVELA) 800 MG tablet Take 2,400 mg by mouth 3 (three) times daily with meals. & 800 mg with snacks     No current facility-administered medications for this encounter.    Labs COAG Lab Results  Component Value Date   INR 1.01 09/27/2012   No results found for: PTT  PHYSICAL EXAM  There were no vitals filed for this visit.  General:   WDWN in NAD HENT: WNL Eyes: Pupils equal Pulmonary: normal non-labored breathing  Cardiac: RRR, Skin: normal, no cyanosis, jaundice, pallor or bruising Vascular Exam/Pulses: 2+  pulses in B Upper extremities. Extremities without ischemic changes, no Gangrene , no cellulitis; no open wounds;   There is a good thrill and good bruit in the Left AV fistula. Hand grip is 5/5 and sensation in digits are intact;   Impression: This is a 45 y.o. female who has a functioning HD access.  Plan: Removal of Right IJ HD catheter [name], Patricia [name] 03/20/2016 1:02 PM   [facility] Catheter Removal Procedure Note  Diagnosis: ESRD with Functioning AVF/AVGG  Plan:  Remove right diatek catheter  Consent signed:  yes Time out completed:  yes Coumadin:  No. PT/INR (if applicable):   Other labs:   Procedure: 1.  Sterile prepping and draping over catheter area 2. 5 ml 2% lidocaine plain instilled at removal site. 3.  right catheter removed in its entirety with cuff in tact. 4.  Complications: none  5. Tip of catheter sent for culture:  no   Patient tolerated procedure well:  yes Pressure held, no bleeding noted, dressing applied Instructions given to the pt regarding wound care and bleeding.  Other:  [name],  Patricia [name] 03/20/2016 1:02 PM

## 2016-04-12 ENCOUNTER — Inpatient Hospital Stay (HOSPITAL_COMMUNITY): Admission: RE | Admit: 2016-04-12 | Payer: PRIVATE HEALTH INSURANCE | Source: Ambulatory Visit

## 2016-04-12 ENCOUNTER — Encounter (HOSPITAL_COMMUNITY)
Admission: RE | Admit: 2016-04-12 | Discharge: 2016-04-12 | Disposition: A | Discharge status: Home / Self Care | Payer: PRIVATE HEALTH INSURANCE | Source: Ambulatory Visit | Attending: Cardiovascular Disease | Admitting: Cardiovascular Disease

## 2016-04-12 ENCOUNTER — Encounter (HOSPITAL_COMMUNITY): Payer: Self-pay

## 2016-04-12 DIAGNOSIS — Z01818 Encounter for other preprocedural examination: Secondary | ICD-10-CM | POA: Diagnosis not present

## 2016-04-12 HISTORY — DX: Malignant (primary) neoplasm, unspecified: C80.1

## 2016-04-12 LAB — NM MYOCAR MULTI W/SPECT W/WALL MOTION / EF
CHL CUP NUCLEAR SDS: 5
CHL CUP NUCLEAR SSS: 7
CSEPPHR: 89 {beats}/min
LHR: 0.3
LV dias vol: 93 mL (ref 46–106)
LVSYSVOL: 32 mL
Rest HR: 71 {beats}/min
SRS: 2
TID: 0.99

## 2016-04-12 MED ORDER — SODIUM CHLORIDE 0.9% FLUSH
INTRAVENOUS | Status: AC
  Filled 2016-04-12: qty 150

## 2016-04-12 MED ORDER — REGADENOSON 0.4 MG/5ML IV SOLN
INTRAVENOUS | Status: AC
Start: 2016-04-12 — End: 2016-04-12
  Administered 2016-04-12: 0.4 mg via INTRAVENOUS
  Filled 2016-04-12: qty 5

## 2016-04-12 MED ORDER — TECHNETIUM TC 99M SESTAMIBI - CARDIOLITE
30.0000 | Freq: Once | INTRAVENOUS | Status: AC | PRN
  Administered 2016-04-12: 30 via INTRAVENOUS

## 2016-04-12 MED ORDER — SODIUM CHLORIDE 0.9% FLUSH
INTRAVENOUS | Status: AC
  Administered 2016-04-12: 10 mL via INTRAVENOUS
  Filled 2016-04-12: qty 10

## 2016-04-12 MED ORDER — TECHNETIUM TC 99M SESTAMIBI GENERIC - CARDIOLITE
10.0000 | Freq: Once | INTRAVENOUS | Status: AC | PRN
  Administered 2016-04-12: 10 via INTRAVENOUS

## 2016-04-19 DIAGNOSIS — I10 Essential (primary) hypertension: Secondary | ICD-10-CM | POA: Diagnosis not present

## 2016-04-19 DIAGNOSIS — N186 End stage renal disease: Secondary | ICD-10-CM | POA: Diagnosis not present

## 2016-04-19 DIAGNOSIS — Z6841 Body Mass Index (BMI) 40.0 and over, adult: Secondary | ICD-10-CM | POA: Diagnosis not present

## 2016-04-19 DIAGNOSIS — E1129 Type 2 diabetes mellitus with other diabetic kidney complication: Secondary | ICD-10-CM | POA: Diagnosis not present

## 2016-04-19 DIAGNOSIS — Z1389 Encounter for screening for other disorder: Secondary | ICD-10-CM | POA: Diagnosis not present

## 2016-04-20 ENCOUNTER — Telehealth: Payer: Self-pay | Admitting: *Deleted

## 2016-04-20 NOTE — Telephone Encounter (Signed)
Notes Recorded by Laurine Blazer, LPN on 0/12/5866 at 2:57 PM Patient notified. Copy fwd to [place], RN, BSN (transplant coordinator) at [facility]. Fax: [phone].  Notes Recorded by Laurine Blazer, LPN on 12/21/9537 at 6:72 PM Left message to return call.  Notes Recorded by Herminio Commons, MD on 04/18/2016 at 10:53 AM She can proceed with transplant given normal stress test. No follow up is required.  Notes Recorded by Laurine Blazer, LPN on 07/25/7914 at 04:13 AM Does patient need any further follow up & is she clear for renal transplant ?  Notes Recorded by Herminio Commons, MD on 04/12/2016 at 5:33 PM Normal.

## 2016-05-16 DIAGNOSIS — N186 End stage renal disease: Secondary | ICD-10-CM | POA: Diagnosis not present

## 2016-05-16 DIAGNOSIS — Z992 Dependence on renal dialysis: Secondary | ICD-10-CM | POA: Diagnosis not present

## 2016-06-07 DIAGNOSIS — H65492 Other chronic nonsuppurative otitis media, left ear: Secondary | ICD-10-CM | POA: Diagnosis not present

## 2016-06-07 DIAGNOSIS — H9012 Conductive hearing loss, unilateral, left ear, with unrestricted hearing on the contralateral side: Secondary | ICD-10-CM | POA: Diagnosis not present

## 2016-06-26 DIAGNOSIS — H6532 Chronic mucoid otitis media, left ear: Secondary | ICD-10-CM | POA: Diagnosis not present

## 2016-06-26 DIAGNOSIS — H9012 Conductive hearing loss, unilateral, left ear, with unrestricted hearing on the contralateral side: Secondary | ICD-10-CM | POA: Diagnosis not present

## 2016-07-04 ENCOUNTER — Inpatient Hospital Stay (HOSPITAL_COMMUNITY)
Admission: EM | Admit: 2016-07-04 | Discharge: 2016-07-11 | DRG: 391 | Disposition: A | Discharge status: Home / Self Care | Payer: PRIVATE HEALTH INSURANCE | Attending: Internal Medicine | Admitting: Internal Medicine

## 2016-07-04 ENCOUNTER — Emergency Department (HOSPITAL_COMMUNITY): Payer: PRIVATE HEALTH INSURANCE

## 2016-07-04 ENCOUNTER — Encounter (HOSPITAL_COMMUNITY): Payer: Self-pay | Admitting: Emergency Medicine

## 2016-07-04 DIAGNOSIS — R1084 Generalized abdominal pain: Secondary | ICD-10-CM | POA: Insufficient documentation

## 2016-07-04 DIAGNOSIS — I2510 Atherosclerotic heart disease of native coronary artery without angina pectoris: Secondary | ICD-10-CM | POA: Diagnosis present

## 2016-07-04 DIAGNOSIS — E119 Type 2 diabetes mellitus without complications: Secondary | ICD-10-CM

## 2016-07-04 DIAGNOSIS — E0801 Diabetes mellitus due to underlying condition with hyperosmolarity with coma: Secondary | ICD-10-CM | POA: Diagnosis not present

## 2016-07-04 DIAGNOSIS — I10 Essential (primary) hypertension: Secondary | ICD-10-CM

## 2016-07-04 DIAGNOSIS — E8889 Other specified metabolic disorders: Secondary | ICD-10-CM | POA: Diagnosis present

## 2016-07-04 DIAGNOSIS — E871 Hypo-osmolality and hyponatremia: Secondary | ICD-10-CM | POA: Diagnosis not present

## 2016-07-04 DIAGNOSIS — K2970 Gastritis, unspecified, without bleeding: Secondary | ICD-10-CM | POA: Diagnosis present

## 2016-07-04 DIAGNOSIS — K219 Gastro-esophageal reflux disease without esophagitis: Principal | ICD-10-CM | POA: Diagnosis present

## 2016-07-04 DIAGNOSIS — Z9049 Acquired absence of other specified parts of digestive tract: Secondary | ICD-10-CM

## 2016-07-04 DIAGNOSIS — Z794 Long term (current) use of insulin: Secondary | ICD-10-CM

## 2016-07-04 DIAGNOSIS — N19 Unspecified kidney failure: Secondary | ICD-10-CM

## 2016-07-04 DIAGNOSIS — N186 End stage renal disease: Secondary | ICD-10-CM | POA: Diagnosis not present

## 2016-07-04 DIAGNOSIS — K311 Adult hypertrophic pyloric stenosis: Secondary | ICD-10-CM | POA: Diagnosis present

## 2016-07-04 DIAGNOSIS — E875 Hyperkalemia: Secondary | ICD-10-CM | POA: Diagnosis present

## 2016-07-04 DIAGNOSIS — R112 Nausea with vomiting, unspecified: Secondary | ICD-10-CM | POA: Insufficient documentation

## 2016-07-04 DIAGNOSIS — R110 Nausea: Secondary | ICD-10-CM

## 2016-07-04 DIAGNOSIS — Z992 Dependence on renal dialysis: Secondary | ICD-10-CM

## 2016-07-04 DIAGNOSIS — I120 Hypertensive chronic kidney disease with stage 5 chronic kidney disease or end stage renal disease: Secondary | ICD-10-CM | POA: Diagnosis not present

## 2016-07-04 DIAGNOSIS — K5900 Constipation, unspecified: Secondary | ICD-10-CM

## 2016-07-04 DIAGNOSIS — D631 Anemia in chronic kidney disease: Secondary | ICD-10-CM | POA: Diagnosis present

## 2016-07-04 DIAGNOSIS — R0600 Dyspnea, unspecified: Secondary | ICD-10-CM

## 2016-07-04 DIAGNOSIS — E1122 Type 2 diabetes mellitus with diabetic chronic kidney disease: Secondary | ICD-10-CM | POA: Diagnosis present

## 2016-07-04 DIAGNOSIS — R079 Chest pain, unspecified: Secondary | ICD-10-CM

## 2016-07-04 DIAGNOSIS — R0602 Shortness of breath: Secondary | ICD-10-CM | POA: Diagnosis not present

## 2016-07-04 DIAGNOSIS — R0789 Other chest pain: Secondary | ICD-10-CM | POA: Diagnosis not present

## 2016-07-04 LAB — GLUCOSE, CAPILLARY
Glucose-Capillary: 148 mg/dL — ABNORMAL HIGH (ref 65–99)
Glucose-Capillary: 91 mg/dL (ref 65–99)
Glucose-Capillary: 188 mg/dL — ABNORMAL HIGH (ref 65–99)

## 2016-07-04 LAB — CBC WITH DIFFERENTIAL/PLATELET
BASOS ABS: 0 10*3/uL (ref 0.0–0.1)
BASOS PCT: 0 %
Eosinophils Absolute: 0.3 10*3/uL (ref 0.0–0.7)
Eosinophils Relative: 2 %
HEMATOCRIT: 34.9 % — AB (ref 36.0–46.0)
HEMOGLOBIN: 11.3 g/dL — AB (ref 12.0–15.0)
Lymphocytes Relative: 16 %
Lymphs Abs: 1.9 10*3/uL (ref 0.7–4.0)
MCH: 29.7 pg (ref 26.0–34.0)
MCHC: 32.4 g/dL (ref 30.0–36.0)
MCV: 91.8 fL (ref 78.0–100.0)
Monocytes Absolute: 0.5 10*3/uL (ref 0.1–1.0)
Monocytes Relative: 4 %
NEUTROS ABS: 8.9 10*3/uL — AB (ref 1.7–7.7)
NEUTROS PCT: 78 %
Platelets: 303 10*3/uL (ref 150–400)
RBC: 3.8 MIL/uL — AB (ref 3.87–5.11)
RDW: 15.6 % — ABNORMAL HIGH (ref 11.5–15.5)
WBC: 11.5 10*3/uL — ABNORMAL HIGH (ref 4.0–10.5)

## 2016-07-04 LAB — BASIC METABOLIC PANEL
ANION GAP: 12 (ref 5–15)
BUN: 56 mg/dL — ABNORMAL HIGH (ref 6–20)
CALCIUM: 10 mg/dL (ref 8.9–10.3)
CO2: 22 mmol/L (ref 22–32)
Chloride: 101 mmol/L (ref 101–111)
Creatinine, Ser: 10.64 mg/dL — ABNORMAL HIGH (ref 0.44–1.00)
GFR, EST AFRICAN AMERICAN: 4 mL/min — AB (ref >60)
GFR, EST NON AFRICAN AMERICAN: 4 mL/min — AB (ref >60)
Glucose, Bld: 257 mg/dL — ABNORMAL HIGH (ref 65–99)
POTASSIUM: 5.2 mmol/L — AB (ref 3.5–5.1)
Sodium: 135 mmol/L (ref 135–145)

## 2016-07-04 LAB — MRSA PCR SCREENING: MRSA by PCR: NEGATIVE

## 2016-07-04 LAB — TROPONIN I: Troponin I: 0.04 ng/mL (ref <0.03)

## 2016-07-04 MED ORDER — INSULIN ASPART 100 UNIT/ML ~~LOC~~ SOLN: 0.0000 [IU] | Freq: Three times a day (TID) | SUBCUTANEOUS | Status: DC

## 2016-07-04 MED ORDER — FUROSEMIDE 40 MG PO TABS
40.0000 mg | ORAL_TABLET | Freq: Two times a day (BID) | ORAL | Status: DC
  Administered 2016-07-04 – 2016-07-11 (×12): 40 mg via ORAL
  Filled 2016-07-04 (×13): qty 1

## 2016-07-04 MED ORDER — ACETAMINOPHEN 325 MG PO TABS
650.0000 mg | ORAL_TABLET | Freq: Once | ORAL | Status: AC
  Administered 2016-07-04: 650 mg via ORAL
  Filled 2016-07-04: qty 2

## 2016-07-04 MED ORDER — MORPHINE SULFATE (PF) 2 MG/ML IV SOLN
2.0000 mg | INTRAVENOUS | Status: DC | PRN
  Administered 2016-07-06 – 2016-07-11 (×2): 2 mg via INTRAVENOUS
  Filled 2016-07-04 (×2): qty 1

## 2016-07-04 MED ORDER — NIFEDIPINE ER OSMOTIC RELEASE 30 MG PO TB24
60.0000 mg | ORAL_TABLET | Freq: Every day | ORAL | Status: DC
  Administered 2016-07-04 – 2016-07-05 (×2): 60 mg via ORAL
  Filled 2016-07-04 (×2): qty 2

## 2016-07-04 MED ORDER — HEPARIN SODIUM (PORCINE) 1000 UNIT/ML DIALYSIS: 1000.0000 [IU] | INTRAMUSCULAR | Status: DC | PRN

## 2016-07-04 MED ORDER — HEPARIN SODIUM (PORCINE) 1000 UNIT/ML DIALYSIS: 20.0000 [IU]/kg | INTRAMUSCULAR | Status: DC | PRN

## 2016-07-04 MED ORDER — ONDANSETRON HCL 4 MG/2ML IJ SOLN
4.0000 mg | Freq: Four times a day (QID) | INTRAMUSCULAR | Status: DC | PRN
  Administered 2016-07-04 – 2016-07-10 (×4): 4 mg via INTRAVENOUS
  Filled 2016-07-04 (×5): qty 2

## 2016-07-04 MED ORDER — SEVELAMER CARBONATE 800 MG PO TABS
2400.0000 mg | ORAL_TABLET | Freq: Three times a day (TID) | ORAL | Status: DC
Start: 2016-07-04 — End: 2016-07-11
  Administered 2016-07-04 – 2016-07-11 (×18): 2400 mg via ORAL
  Filled 2016-07-04 (×28): qty 3

## 2016-07-04 MED ORDER — NITROGLYCERIN 0.4 MG SL SUBL
0.4000 mg | SUBLINGUAL_TABLET | SUBLINGUAL | Status: DC | PRN
  Administered 2016-07-04: 0.4 mg via SUBLINGUAL
  Filled 2016-07-04: qty 1

## 2016-07-04 MED ORDER — FLUTICASONE PROPIONATE 50 MCG/ACT NA SUSP
2.0000 | Freq: Every day | NASAL | Status: DC
  Administered 2016-07-04 – 2016-07-11 (×8): 2 via NASAL
  Filled 2016-07-04 (×5): qty 16

## 2016-07-04 MED ORDER — EPOETIN ALFA 4000 UNIT/ML IJ SOLN
INTRAMUSCULAR | Status: AC
  Administered 2016-07-04: 2000 [IU]
  Filled 2016-07-04: qty 1

## 2016-07-04 MED ORDER — ALBUTEROL SULFATE (2.5 MG/3ML) 0.083% IN NEBU
5.0000 mg | INHALATION_SOLUTION | Freq: Once | RESPIRATORY_TRACT | Status: AC
  Administered 2016-07-04: 5 mg via RESPIRATORY_TRACT
  Filled 2016-07-04: qty 6

## 2016-07-04 MED ORDER — ASPIRIN 81 MG PO CHEW
81.0000 mg | CHEWABLE_TABLET | Freq: Every day | ORAL | Status: DC
  Administered 2016-07-04 – 2016-07-11 (×8): 81 mg via ORAL
  Filled 2016-07-04 (×8): qty 1

## 2016-07-04 MED ORDER — HEPARIN SODIUM (PORCINE) 5000 UNIT/ML IJ SOLN
5000.0000 [IU] | Freq: Three times a day (TID) | INTRAMUSCULAR | Status: DC
  Administered 2016-07-04 – 2016-07-09 (×10): 5000 [IU] via SUBCUTANEOUS
  Filled 2016-07-04 (×12): qty 1

## 2016-07-04 MED ORDER — SODIUM CHLORIDE 0.9 % IV SOLN: 100.0000 mL | INTRAVENOUS | Status: DC | PRN

## 2016-07-04 MED ORDER — ASPIRIN 81 MG PO CHEW
324.0000 mg | CHEWABLE_TABLET | Freq: Once | ORAL | Status: AC
  Administered 2016-07-04: 324 mg via ORAL
  Filled 2016-07-04: qty 4

## 2016-07-04 MED ORDER — LIDOCAINE-PRILOCAINE 2.5-2.5 % EX CREA: 1.0000 "application " | TOPICAL_CREAM | CUTANEOUS | Status: DC | PRN

## 2016-07-04 MED ORDER — LABETALOL HCL 200 MG PO TABS
300.0000 mg | ORAL_TABLET | Freq: Two times a day (BID) | ORAL | Status: DC
Start: 2016-07-04 — End: 2016-07-11
  Administered 2016-07-04 – 2016-07-10 (×12): 300 mg via ORAL
  Filled 2016-07-04 (×14): qty 2

## 2016-07-04 MED ORDER — SORBITOL 70 % SOLN
960.0000 mL | TOPICAL_OIL | Freq: Once | ORAL | Status: AC
  Administered 2016-07-04: 960 mL via RECTAL
  Filled 2016-07-04: qty 240

## 2016-07-04 MED ORDER — PANTOPRAZOLE SODIUM 40 MG PO TBEC
40.0000 mg | DELAYED_RELEASE_TABLET | Freq: Every day | ORAL | Status: DC
  Administered 2016-07-04 – 2016-07-09 (×6): 40 mg via ORAL
  Filled 2016-07-04 (×6): qty 1

## 2016-07-04 MED ORDER — EPOETIN ALFA 2000 UNIT/ML IJ SOLN
2000.0000 [IU] | INTRAMUSCULAR | Status: DC
  Filled 2016-07-04 (×2): qty 1

## 2016-07-04 MED ORDER — LIDOCAINE HCL (PF) 1 % IJ SOLN: 5.0000 mL | INTRAMUSCULAR | Status: DC | PRN

## 2016-07-04 MED ORDER — ALTEPLASE 2 MG IJ SOLR: 2.0000 mg | Freq: Once | INTRAMUSCULAR | Status: DC | PRN

## 2016-07-04 MED ORDER — ACETAMINOPHEN 325 MG PO TABS
650.0000 mg | ORAL_TABLET | ORAL | Status: DC | PRN
  Administered 2016-07-05 – 2016-07-11 (×4): 650 mg via ORAL
  Filled 2016-07-04 (×4): qty 2

## 2016-07-04 MED ORDER — PENTAFLUOROPROP-TETRAFLUOROETH EX AERO: 1.0000 "application " | INHALATION_SPRAY | CUTANEOUS | Status: DC | PRN

## 2016-07-04 MED ORDER — ROPINIROLE HCL 1 MG PO TABS
2.0000 mg | ORAL_TABLET | Freq: Every day | ORAL | Status: DC
  Administered 2016-07-04 – 2016-07-10 (×7): 2 mg via ORAL
  Filled 2016-07-04 (×9): qty 2

## 2016-07-04 MED ORDER — DOCUSATE SODIUM 100 MG PO CAPS
100.0000 mg | ORAL_CAPSULE | Freq: Two times a day (BID) | ORAL | Status: DC
  Administered 2016-07-04 – 2016-07-11 (×14): 100 mg via ORAL
  Filled 2016-07-04 (×19): qty 1

## 2016-07-04 MED ORDER — EPOETIN ALFA 2000 UNIT/ML IJ SOLN: 2000.0000 [IU] | INTRAMUSCULAR | Status: DC

## 2016-07-04 NOTE — ED Notes (Signed)
Called to give report charge nurse has not approved room

## 2016-07-04 NOTE — ED Notes (Signed)
Troponin reported to ed md

## 2016-07-04 NOTE — Consult Note (Signed)
Patricia [name] MRN: [id number] DOB/AGE: 47-Sep-1972 47 y.o. Primary Care Physician:JACKSON,SAMANTHA, PA-C Admit date: 07/04/2016 Chief Complaint:  Chief Complaint  Patient presents with  . Shortness of Breath   HPI: Pt is 45 year old female with past medical hx of ESRD who came to ER with c/o shortness of breath.   HPI dates back to past few days when pt started feeling dyspnea, it was progressive and so she came to ER Pt also c/o chest pain, mid sternal, non radiating but jumps from chest to flank to mid abdomen. NO c/o fever/cough/chills NO c/o Nausea/vomiting No c/o abdominal pain NO c/o syncope NO c/o recent travel No c/o frequency/urgency/dysuria Pt does c/o constipation.   Past Medical History  Diagnosis Date  . Hypertension   . Diabetes mellitus without complication ([place])   . Blood transfusion without reported diagnosis   . Dialysis patient ([place])     mon, wed, friday,   . Renal disorder   . Renal insufficiency   . Cancer [facility])         History reviewed. No pertinent family history.  Social History:  reports that she has never smoked. She has never used smokeless tobacco. She reports that she does not drink alcohol or use illicit drugs.   Allergies:  Allergies  Allergen Reactions  . Reglan [Metoclopramide] Other (See Comments)    hallucinations  . Amlodipine Besylate Rash and Other (See Comments)    dizziness     (Not in a hospital admission)     MCN:OBSJG from the symptoms mentioned above,there are no other symptoms referable to all systems reviewed.    Physical Exam: Vital signs in last 24 hours: Temp:  [98.3 F (36.8 C)] 98.3 F (36.8 C) (07/19 0448) Pulse Rate:  [77-93] 78 (07/19 0930) Resp:  [18-27] 19 (07/19 0930) BP: (133-194)/(71-88) 192/88 mmHg (07/19 0930) SpO2:  [95 %-99 %] 95 % (07/19 0930) Weight:  [235 lb (106.595 kg)] 235 lb (106.595 kg) (07/19 0445) Weight change:     Intake/Output from previous day:       Physical  Exam: General- pt is awake,alert, oriented to time place and person Resp- No acute REsp distress, CTA B/L NO Rhonchi CVS- S1S2 regular in rate and rhythm GIT- BS+, soft, NT, ND EXT- NO LE Edema, NO Cyanosis CNS- CN 2-12 grossly intact. Moving all 4 extremities Psych- normal mood and affect Access-AVF   Lab Results: CBC  Recent Labs  07/04/16 0531  WBC 11.5*  HGB 11.3*  HCT 34.9*  PLT 303    BMET  Recent Labs  07/04/16 0531  NA 135  K 5.2*  CL 101  CO2 22  GLUCOSE 257*  BUN 56*  CREATININE 10.64*  CALCIUM 10.0    MICRO No results found for this or any previous visit (from the past 240 hour(s)).    Lab Results  Component Value Date   CALCIUM 10.0 07/04/2016   CAION 1.28* 09/27/2012   PHOS 4.3 12/05/2015      Impression: 1)Renal  ESRD on HD                Pt is on Mon/Wed/Fri schedule                Pt will be dialyzed today  2)HTN   BP not at goal               Hd should help  Medication-  On Diuretics-Lasix On Calcium Channel Blockers On Alpha and beta Blockers  3)Anemia IN ESRD the goal for  HGb 9--11 HGb is at goal Will keep low dose epo   4)CKD Mineral-Bone Disorder  Phosphorus at goal. Calcium is  at goal.  5)CAD-admitted with chest pain  Primary MD following  6)Electrolytes Hyperkalemic    Sec to ESRD Normonatremic   7)Acid base Co2 at goal     Plan:  Will dialyze today Will use 2 k bath Will keep on low dose epo      [name] S 07/04/2016, 10:11 AM

## 2016-07-04 NOTE — Procedures (Signed)
   HEMODIALYSIS TREATMENT NOTE:  4 hour low-heparin dialysis completed via left upper arm AVF (15g/antegrade). Goal met: 2.1 liters removed without interruption in ultrafiltration.  All blood was returned and hemostasis was achieved within 15 minutes. Report called to [employer], RN.  Rockwell Alexandria, RN, CDN

## 2016-07-04 NOTE — ED Notes (Signed)
Sob, chest congestion, cough and sneezing since yesterday.

## 2016-07-04 NOTE — H&P (Signed)
History and Physical    Patricia [name] QMV:[id number] DOB: 1971-10-28 DOA: 07/04/2016  PCP: [name]  Patient coming from: Home  Chief Complaint: Nausea, Chest pain  HPI: Patricia [name] is a 45 y.o. female with medical history significant of hypertension, diabetes, end-stage renal disease on Monday Wednesday Friday hemodialysis presents to the emergency department with complaints of progressively worsening chest pain and nausea. Patient describes chest pain as being left-sided with radiation to the right flank. Symptoms improved with Tums. Did not seem to be positional. Given worsening symptoms, patient decided to present to the emergency department  ED Course: In emergency department, patient was noted have troponins of 0.04 with unremarkable EKG. Patient was given nitroglycerin 1 with improvement of chest pain. Given concerns of cardiac risk factors, hospital service is consulted for consideration for admission  On further questioning, patient reports Nagorski-standing history of marked constipation. Also, patient is status post recent negative stress test approximately 2 months prior to hospital admission.  Review of Systems:  Review of Systems  Constitutional: Negative for fever and chills.  HENT: Negative for ear pain and tinnitus.   Eyes: Negative for double vision and photophobia.  Respiratory: Negative for hemoptysis and wheezing.   Cardiovascular: Negative for orthopnea.  Gastrointestinal: Positive for nausea, abdominal pain and constipation.  Genitourinary: Negative for urgency and frequency.  Musculoskeletal: Negative for back pain, falls and neck pain.  Neurological: Negative for tremors and loss of consciousness.  Psychiatric/Behavioral: Negative for hallucinations and memory loss.     Past Medical History  Diagnosis Date  . Hypertension   . Diabetes mellitus without complication ([place])   . Blood transfusion without reported diagnosis   . Dialysis patient ([place])       mon, wed, friday,   . Renal disorder   . Renal insufficiency   . Cancer [facility])     Past Surgical History  Procedure Laterality Date  . Cholecystectomy    . Cesarean section    . Dilation and curettage of uterus    . Abdominal hysterectomy       reports that she has never smoked. She has never used smokeless tobacco. She reports that she does not drink alcohol or use illicit drugs.  Allergies  Allergen Reactions  . Reglan [Metoclopramide] Other (See Comments)    hallucinations  . Amlodipine Besylate Rash and Other (See Comments)    dizziness    History reviewed. No pertinent family history. Unacceptable: Noncontributory, unremarkable, or negative. Acceptable: Family history reviewed and not pertinent (If you reviewed it)  Prior to Admission medications   Medication Sig Start Date End Date Taking? Authorizing Provider  aspirin 81 MG tablet Take 81 mg by mouth daily.   Yes Historical Provider, MD  furosemide (LASIX) 40 MG tablet Take 40 mg by mouth 2 (two) times daily.    Yes Historical Provider, MD  glipiZIDE (GLUCOTROL) 5 MG tablet Take 5 mg by mouth 2 (two) times daily before a meal.   Yes Historical Provider, MD  labetalol (NORMODYNE) 300 MG tablet Take 1 tablet (300 mg total) by mouth 2 (two) times daily. 12/06/15  Yes Erline Hau, MD  NIFEdipine (PROCARDIA-XL/ADALAT CC) 60 MG 24 hr tablet Take 1 tablet (60 mg total) by mouth daily. 12/06/15  Yes Erline Hau, MD  pantoprazole (PROTONIX) 20 MG tablet Take 1 tablet by mouth daily as needed.  11/28/15  Yes Historical Provider, MD  rOPINIRole (REQUIP) 2 MG tablet Take 2 mg by mouth at bedtime.  Yes Historical Provider, MD  sevelamer carbonate (RENVELA) 800 MG tablet Take 2,400 mg by mouth 3 (three) times daily with meals. & 800 mg with snacks   Yes Historical Provider, MD  B Complex-C-Folic Acid (DIALYVITE 619) 0.8 MG TABS Take 1 tablet by mouth daily. 12/02/15   Historical Provider, MD     Physical Exam: Filed Vitals:   07/04/16 0648 07/04/16 0700 07/04/16 0730 07/04/16 0800  BP: 165/84 176/77 186/80 189/75  Pulse: 81 93  82  Temp:      TempSrc:      Resp: '20 18 27 22  '$ Height:      Weight:      SpO2:  98%  95%      Constitutional: NAD, calm, comfortable Filed Vitals:   07/04/16 0648 07/04/16 0700 07/04/16 0730 07/04/16 0800  BP: 165/84 176/77 186/80 189/75  Pulse: 81 93  82  Temp:      TempSrc:      Resp: '20 18 27 22  '$ Height:      Weight:      SpO2:  98%  95%   Eyes: PERRL, lids and conjunctivae normal ENMT: Mucous membranes are moist. Posterior pharynx clear of any exudate or lesions.Normal dentition.  Neck: normal, supple, no masses, no thyromegaly Respiratory: clear to auscultation bilaterally, no wheezing, no crackles. Normal respiratory effort. No accessory muscle use.  Cardiovascular: Regular rate and rhythm, no murmurs / rubs / gallops. No extremity edema. 2+ pedal pulses. No carotid bruits.  Abdomen: no tenderness, no masses palpated. No hepatosplenomegaly. Bowel sounds positive.  Musculoskeletal: no clubbing / cyanosis. No joint deformity upper and lower extremities. Good ROM, no contractures. Normal muscle tone.  Skin: no rashes, lesions, ulcers. No induration Neurologic: CN 2-12 grossly intact. Sensation intact, DTR normal. Strength 5/5 in all 4.  Psychiatric: Normal judgment and insight. Alert and oriented x 3. Normal mood.   (Anything < 9 systems with 2 bullets each down codes to level 1) (If patient refuses exam can't bill higher level) (Make sure to document decubitus ulcers present on admission -- if possible -- and whether patient has chronic indwelling catheter at time of admission)  Labs on Admission: I have personally reviewed following labs and imaging studies  CBC:  Recent Labs Lab 07/04/16 0531  WBC 11.5*  NEUTROABS 8.9*  HGB 11.3*  HCT 34.9*  MCV 91.8  PLT 509   Basic Metabolic Panel:  Recent Labs Lab  07/04/16 0531  NA 135  K 5.2*  CL 101  CO2 22  GLUCOSE 257*  BUN 56*  CREATININE 10.64*  CALCIUM 10.0   GFR: Estimated Creatinine Clearance: 7.7 mL/min (by C-G formula based on Cr of 10.64). Liver Function Tests: No results for input(s): AST, ALT, ALKPHOS, BILITOT, PROT, ALBUMIN in the last 168 hours. No results for input(s): LIPASE, AMYLASE in the last 168 hours. No results for input(s): AMMONIA in the last 168 hours. Coagulation Profile: No results for input(s): INR, PROTIME in the last 168 hours. Cardiac Enzymes:  Recent Labs Lab 07/04/16 0531  TROPONINI 0.04*   BNP (last 3 results) No results for input(s): PROBNP in the last 8760 hours. HbA1C: No results for input(s): HGBA1C in the last 72 hours. CBG: No results for input(s): GLUCAP in the last 168 hours. Lipid Profile: No results for input(s): CHOL, HDL, LDLCALC, TRIG, CHOLHDL, LDLDIRECT in the last 72 hours. Thyroid Function Tests: No results for input(s): TSH, T4TOTAL, FREET4, T3FREE, THYROIDAB in the last 72 hours. Anemia Panel: No results  for input(s): VITAMINB12, FOLATE, FERRITIN, TIBC, IRON, RETICCTPCT in the last 72 hours. Urine analysis: No results found for: COLORURINE, APPEARANCEUR, LABSPEC, PHURINE, GLUCOSEU, HGBUR, BILIRUBINUR, KETONESUR, PROTEINUR, UROBILINOGEN, NITRITE, LEUKOCYTESUR Sepsis Labs: !!!!!!!!!!!!!!!!!!!!!!!!!!!!!!!!!!!!!!!!!!!! '@LABRCNTIP'$ (procalcitonin:4,lacticidven:4) )No results found for this or any previous visit (from the past 240 hour(s)).   Radiological Exams on Admission: Dg Chest 2 View  07/04/2016  CLINICAL DATA:  Shortness of breath.  Chest congestion. EXAM: CHEST  2 VIEW COMPARISON:  12/04/2015. FINDINGS: Mediastinum and hilar structures normal. Mild cardiomegaly and pulmonary venous congestion noted. No pulmonary edema or focal infiltrate. No pleural effusion or pneumothorax. IMPRESSION: Mild cardiomegaly pulmonary venous congestion. No pulmonary edema or focal infiltrate.  Electronically Signed   By: Marcello Moores  Register   On: 07/04/2016 06:42    EKG: Independently reviewed. Normal sinus rhythm  Assessment/Plan Principal Problem:   Chest pain Active Problems:   ESRD on dialysis ([facility])   Diabetes mellitus ([facility])   Constipation   Nausea   Essential hypertension    Chest pain -Initial troponin unremarkable. EKG is unremarkable -Chest pains historically improved with Tums -Patient is status post a recent negative stress test -Doubt cardiogenic chest pain. Instead, consider possible reflux disease -Continue patient with scheduled PPI -Follow serial troponin -We'll obtain 2-D echocardiogram -Repeat EKG in the morning -For now, admit to med telemetry, OBSERVATION  End-stage renal disease -Nephrology consulted through the emergency department -Patient to resume Monday was a Friday hemodialysis as scheduled  Diabetes mellitus -We'll continue patient with supplemental sliding scale insulin -For now, hold oral hyperglycemics while admitted  Constipation -Patient acknowledges a Nygren-standing history of constipation. -Reports little to no results with suppositories and daily MiraLAX -We'll give a trial of smog enema -Continue cathartics until good results  Nausea -Likely secondary to above constipation -Patient is status post cholecystectomy  Hypertension -At present, poorly controlled -We'll continue home blood pressure regimen, titrate accordingly   DVT prophylaxis: Heparin Code Status: Full Family Communication: Pt in room Disposition Plan:Home when stable Consults called:  Admission status: Obs, tele   Patricia [name], Orpah Melter MD [facility] Pager [phone]  If 7PM-7AM, please contact night-coverage [email or web address] Password [facility]  07/04/2016, 8:31 AM

## 2016-07-04 NOTE — ED Notes (Addendum)
Called to give report, nurse will call me back. Unable to take report at this time

## 2016-07-04 NOTE — ED Provider Notes (Signed)
CSN: [id number]     Arrival date & time 07/04/16  0434 History   First MD Initiated Contact with Patient 07/04/16 [phone]     Chief Complaint  Patient presents with  . Shortness of Breath     Patient is a 45 y.o. female presenting with shortness of breath. The history is provided by the patient.  Shortness of Breath Severity:  Moderate Onset quality:  Gradual Duration:  2 weeks Timing:  Intermittent Progression:  Worsening Chronicity:  New Relieved by:  Nothing Worsened by:  Nothing tried Associated symptoms: chest pain, cough and vomiting   Associated symptoms: no fever   Associated symptoms comment:  Sneezing, cough, post tussive emesis  Patient presents with multiple complaints - she reports cough/congestion/sneezing for past 2 weeks She reports significant cough with post tussive emesis (no blood reported) She also mentions chest pain "like something is stuck" as well "side pain" during coughing No LE edema No dyspnea on exertion  no orthopnea She is on dialysis chronically without missed sessions  Past Medical History  Diagnosis Date  . Hypertension   . Diabetes mellitus without complication ([place])   . Blood transfusion without reported diagnosis   . Dialysis patient ([place])     mon, wed, friday,   . Renal disorder   . Renal insufficiency   . Cancer [facility]    Past Surgical History  Procedure Laterality Date  . Cholecystectomy    . Cesarean section    . Dilation and curettage of uterus    . Abdominal hysterectomy     History reviewed. No pertinent family history. Social History  Substance Use Topics  . Smoking status: Never Smoker   . Smokeless tobacco: Never Used  . Alcohol Use: No   OB History    No data available     Review of Systems  Constitutional: Negative for fever.  Respiratory: Positive for cough and shortness of breath.   Cardiovascular: Positive for chest pain. Negative for leg swelling.  Gastrointestinal: Positive for vomiting.  All other  systems reviewed and are negative.     Allergies  Reglan and Amlodipine besylate  Home Medications   Prior to Admission medications   Medication Sig Start Date End Date Taking? Authorizing Provider  aspirin 81 MG tablet Take 81 mg by mouth daily.    Historical Provider, MD  B Complex-C-Folic Acid (DIALYVITE 119) 0.8 MG TABS Take 1 tablet by mouth daily. 12/02/15   Historical Provider, MD  furosemide (LASIX) 40 MG tablet Take 40 mg by mouth 2 (two) times daily.     Historical Provider, MD  glipiZIDE (GLUCOTROL) 5 MG tablet Take 5 mg by mouth 2 (two) times daily before a meal.    Historical Provider, MD  labetalol (NORMODYNE) 300 MG tablet Take 1 tablet (300 mg total) by mouth 2 (two) times daily. 12/06/15   Erline Hau, MD  NIFEdipine (PROCARDIA-XL/ADALAT CC) 60 MG 24 hr tablet Take 1 tablet (60 mg total) by mouth daily. 12/06/15   Erline Hau, MD  pantoprazole (PROTONIX) 20 MG tablet Take 1 tablet by mouth daily as needed.  11/28/15   Historical Provider, MD  sevelamer carbonate (RENVELA) 800 MG tablet Take 2,400 mg by mouth 3 (three) times daily with meals. & 800 mg with snacks    Historical Provider, MD   BP 133/78 mmHg  Pulse 93  Temp(Src) 98.3 F (36.8 C) (Oral)  Resp 20  Ht '5\' 3"'$  (1.6 m)  Wt 106.595 kg  BMI  41.64 kg/m2  SpO2 97% Physical Exam CONSTITUTIONAL: Well developed/well nourished, anxious HEAD: Normocephalic/atraumatic EYES: EOMI/PERRL ENMT: Mucous membranes moist, uvula midline, no exudates NECK: supple no meningeal signs SPINE/BACK:entire spine nontender CV: S1/S2 noted, no murmurs/rubs/gallops noted LUNGS: Lungs are clear to auscultation bilaterally, no apparent distress ABDOMEN: soft, nontender, no rebound or guarding, bowel sounds noted throughout abdomen GU:no cva tenderness NEURO: Pt is awake/alert/appropriate, moves all extremitiesx4.  No facial droop.   EXTREMITIES: pulses normal/equal, full ROM, dialysis access to left UE  with thrill noted No LE edema noted SKIN: warm, color normal PSYCH: anxious  ED Course  Procedures   Labs Review Labs Reviewed  BASIC METABOLIC PANEL - Abnormal; Notable for the following:    Potassium 5.2 (*)    Glucose, Bld 257 (*)    BUN 56 (*)    Creatinine, Ser 10.64 (*)    GFR calc non Af Amer 4 (*)    GFR calc Af Amer 4 (*)    All other components within normal limits  CBC WITH DIFFERENTIAL/PLATELET - Abnormal; Notable for the following:    WBC 11.5 (*)    RBC 3.80 (*)    Hemoglobin 11.3 (*)    HCT 34.9 (*)    RDW 15.6 (*)    Neutro Abs 8.9 (*)    All other components within normal limits  TROPONIN I - Abnormal; Notable for the following:    Troponin I 0.04 (*)    All other components within normal limits    Imaging Review No results found. I have personally reviewed and evaluated these images and lab results as part of my medical decision-making.   EKG Interpretation   Date/Time:  Wednesday July 04 2016 05:18:15 EDT Ventricular Rate:  83 PR Interval:    QRS Duration: 92 QT Interval:  370 QTC Calculation: 435 R Axis:   63 Text Interpretation:  Sinus rhythm T wave abnormality No significant  change since last tracing Confirmed by Christy Gentles  MD, [place] ([id number]) on  07/04/2016 5:21:53 AM     6:33 AM Pt in the ED with multiple complaints, including cough/sneezing/sob.  She also mentioned chest pain and "side pain" Initially reported pain in right side as well "something stuck" in her chest On re-evaluation she now reports CP in left side reports "feels heavy" She is a poor historian which makes history difficult Due to history/risk factors, will order ASA/NTG 7:18 AM Pt feels improved D/w nephrology (dr Theador Hawthorne) he will arrange dialysis Will admit to medicine for further monitoring of chest pain 7:41 AM D/w dr Wyline Copas He will evaluate patient for admission   Medications  nitroGLYCERIN (NITROSTAT) SL tablet 0.4 mg (0.4 mg Sublingual Given 07/04/16 0649)   acetaminophen (TYLENOL) tablet 650 mg (not administered)  albuterol (PROVENTIL) (2.5 MG/3ML) 0.083% nebulizer solution 5 mg (5 mg Nebulization Given 07/04/16 0532)  aspirin chewable tablet 324 mg (324 mg Oral Given 07/04/16 0648)    MDM   Final diagnoses:  Chest pain, rule out acute myocardial infarction  Dyspnea  Renal failure    Nursing notes including past medical history and social history reviewed and considered in documentation xrays/imaging reviewed by myself and considered during evaluation Labs/vital reviewed myself and considered during evaluation     Ripley Fraise, MD 07/04/16 [phone]

## 2016-07-05 ENCOUNTER — Observation Stay (HOSPITAL_BASED_OUTPATIENT_CLINIC_OR_DEPARTMENT_OTHER): Payer: PRIVATE HEALTH INSURANCE

## 2016-07-05 DIAGNOSIS — Z794 Long term (current) use of insulin: Secondary | ICD-10-CM | POA: Diagnosis not present

## 2016-07-05 DIAGNOSIS — K5900 Constipation, unspecified: Secondary | ICD-10-CM | POA: Diagnosis not present

## 2016-07-05 DIAGNOSIS — I120 Hypertensive chronic kidney disease with stage 5 chronic kidney disease or end stage renal disease: Secondary | ICD-10-CM | POA: Diagnosis not present

## 2016-07-05 DIAGNOSIS — N186 End stage renal disease: Secondary | ICD-10-CM | POA: Diagnosis not present

## 2016-07-05 DIAGNOSIS — K311 Adult hypertrophic pyloric stenosis: Secondary | ICD-10-CM | POA: Diagnosis not present

## 2016-07-05 DIAGNOSIS — E0801 Diabetes mellitus due to underlying condition with hyperosmolarity with coma: Secondary | ICD-10-CM | POA: Diagnosis not present

## 2016-07-05 DIAGNOSIS — R079 Chest pain, unspecified: Secondary | ICD-10-CM

## 2016-07-05 DIAGNOSIS — K219 Gastro-esophageal reflux disease without esophagitis: Secondary | ICD-10-CM | POA: Diagnosis not present

## 2016-07-05 DIAGNOSIS — E871 Hypo-osmolality and hyponatremia: Secondary | ICD-10-CM | POA: Diagnosis not present

## 2016-07-05 DIAGNOSIS — E8889 Other specified metabolic disorders: Secondary | ICD-10-CM | POA: Diagnosis not present

## 2016-07-05 DIAGNOSIS — I10 Essential (primary) hypertension: Secondary | ICD-10-CM | POA: Diagnosis not present

## 2016-07-05 DIAGNOSIS — E1122 Type 2 diabetes mellitus with diabetic chronic kidney disease: Secondary | ICD-10-CM | POA: Diagnosis not present

## 2016-07-05 DIAGNOSIS — R110 Nausea: Secondary | ICD-10-CM | POA: Diagnosis not present

## 2016-07-05 LAB — GLUCOSE, CAPILLARY
GLUCOSE-CAPILLARY: 212 mg/dL — AB (ref 65–99)
GLUCOSE-CAPILLARY: 278 mg/dL — AB (ref 65–99)
Glucose-Capillary: 99 mg/dL (ref 65–99)
Glucose-Capillary: 266 mg/dL — ABNORMAL HIGH (ref 65–99)

## 2016-07-05 LAB — ECHOCARDIOGRAM COMPLETE
HEIGHTINCHES: 63 in
Weight: 3856 oz

## 2016-07-05 LAB — HEPATITIS B SURFACE ANTIGEN: Hepatitis B Surface Ag: NEGATIVE

## 2016-07-05 MED ORDER — NIFEDIPINE ER OSMOTIC RELEASE 30 MG PO TB24
90.0000 mg | ORAL_TABLET | Freq: Every day | ORAL | Status: DC
  Administered 2016-07-06 – 2016-07-10 (×5): 90 mg via ORAL
  Filled 2016-07-05 (×5): qty 3

## 2016-07-05 MED ORDER — SORBITOL 70 % SOLN
30.0000 mL | Freq: Every day | Status: DC | PRN
  Administered 2016-07-06: 30 mL via ORAL
  Filled 2016-07-05: qty 30

## 2016-07-05 MED ORDER — BISACODYL 10 MG RE SUPP
10.0000 mg | Freq: Every day | RECTAL | Status: DC | PRN
Start: 2016-07-05 — End: 2016-07-06

## 2016-07-05 MED ORDER — LACTULOSE 10 GM/15ML PO SOLN
30.0000 g | ORAL | Status: AC
  Administered 2016-07-05: 30 g via ORAL
  Filled 2016-07-05: qty 60

## 2016-07-05 MED ORDER — PEG 3350-KCL-NA BICARB-NACL 420 G PO SOLR
4000.0000 mL | Freq: Once | ORAL | Status: DC
  Filled 2016-07-05: qty 4000

## 2016-07-05 MED ORDER — GLIPIZIDE 5 MG PO TABS
5.0000 mg | ORAL_TABLET | Freq: Two times a day (BID) | ORAL | Status: DC
  Administered 2016-07-05 – 2016-07-11 (×9): 5 mg via ORAL
  Filled 2016-07-05 (×11): qty 1

## 2016-07-05 MED ORDER — SORBITOL 70 % SOLN
960.0000 mL | TOPICAL_OIL | Freq: Once | ORAL | Status: AC
  Administered 2016-07-05: 960 mL via RECTAL
  Filled 2016-07-05: qty 240

## 2016-07-05 MED ORDER — GLIPIZIDE 5 MG PO TABS: 5.0000 mg | ORAL_TABLET | Freq: Two times a day (BID) | ORAL | Status: DC

## 2016-07-05 NOTE — Progress Notes (Signed)
Late entry:  Dr. Lowanda Foster placed orders for patient to have dialysis today but note states tomorrow.  RN called Dr. Lowanda Foster to verify.  Dr. Lowanda Foster stated that patient will have dialysis tomorrow (Friday).

## 2016-07-05 NOTE — Progress Notes (Signed)
Patient agreed to [facility] instead of [facility].  Dr. Wyline Copas aware.  Patient had medium BM after SMOG.   States she, "Feels like she could go more."  Dr. Wyline Copas notified via text page.

## 2016-07-05 NOTE — Progress Notes (Signed)
*  PRELIMINARY RESULTS* Echocardiogram 2D Echocardiogram has been performed.  Patricia [name] 07/05/2016, 2:16 PM

## 2016-07-05 NOTE — Care Management Obs Status (Signed)
[facility] NOTIFICATION   Patient Details  Name: Conny Situ MRN: [id number] Date of Birth: 1971/02/17   Medicare Observation Status Notification Given:  Yes    Rotha Cassels, Chauncey Reading, RN 07/05/2016, 2:55 PM

## 2016-07-05 NOTE — Progress Notes (Signed)
Subjective: Interval History: has complaints some nausea but no vomiting today. She is has some abdominal pain but improving.  Patient complains of constipation. She denies recent difficulty breathing. .  Objective: Vital signs in last 24 hours: Temp:  [97.9 F (36.6 C)-98.9 F (37.2 C)] 98.9 F (37.2 C) (07/20 0713) Pulse Rate:  [72-82] 79 (07/20 0713) Resp:  [18-20] 20 (07/20 0713) BP: (115-199)/(59-98) 142/68 mmHg (07/20 0713) SpO2:  [95 %-100 %] 98 % (07/20 0713) Weight:  [109.317 kg (241 lb)-110.1 kg (242 lb 11.6 oz)] 109.317 kg (241 lb) (07/20 0713) Weight change: 3.505 kg (7 lb 11.6 oz)  Intake/Output from previous day: 07/19 0701 - 07/20 0700 In: -  Out: 2100  Intake/Output this shift:    General appearance: alert, cooperative and no distress Resp: clear to auscultation bilaterally Cardio: regular rate and rhythm, S1, S2 normal, no murmur, click, rub or gallop GI: soft, non-tender; bowel sounds normal; no masses,  no organomegaly Extremities: No edema  Lab Results:  Recent Labs  07/04/16 0531  WBC 11.5*  HGB 11.3*  HCT 34.9*  PLT 303   BMET:  Recent Labs  07/04/16 0531  NA 135  K 5.2*  CL 101  CO2 22  GLUCOSE 257*  BUN 56*  CREATININE 10.64*  CALCIUM 10.0   No results for input(s): PTH in the last 72 hours. Iron Studies: No results for input(s): IRON, TIBC, TRANSFERRIN, FERRITIN in the last 72 hours.  Studies/Results: Dg Chest 2 View  07/04/2016  CLINICAL DATA:  Shortness of breath.  Chest congestion. EXAM: CHEST  2 VIEW COMPARISON:  12/04/2015. FINDINGS: Mediastinum and hilar structures normal. Mild cardiomegaly and pulmonary venous congestion noted. No pulmonary edema or focal infiltrate. No pleural effusion or pneumothorax. IMPRESSION: Mild cardiomegaly pulmonary venous congestion. No pulmonary edema or focal infiltrate. Electronically Signed   By: Marcello Moores  Register   On: 07/04/2016 06:42    I have reviewed the patient's current  medications.  Assessment/Plan: Problem #1 abdominal pain: Seems to be radiating. Presently complains of some nausea but no vomiting. Patient also complains of some constipation. Problem but to end-stage renal disease: She is status post hemodialysis yesterday. Presently feels good. They have removed about 2 L. Problem #3 anemia: Her hemoglobin is within our target goal Problem #4 history of hypertension: Her blood pressure seems to have improved and presently within reasonable range Problem #5 metabolic bone disease: Calcium is range Problem #6 fluid management: Presently she doesn't have any sign of fluid overload. Plan:1] We'll use Dulcolax 10 mg suppository 2] we'll give her sorbitol 30 mL 70% by mouth on when necessary basis 3] if no improvement she may require rectal edema 4] we'll check her renal panel in the morning 5] we'll make arrangement for patient to get dialysis tomorrow which is her regular schedule.      Evona Westra S 07/05/2016,8:15 AM

## 2016-07-05 NOTE — Progress Notes (Signed)
PROGRESS NOTE    Patricia [name]  JKK:[id number] DOB: 1971/02/26 DOA: 07/04/2016 PCP: [name]    Brief Narrative:  45 y.o. female with medical history significant of hypertension, diabetes, end-stage renal disease on Monday Wednesday Friday hemodialysis presents to the emergency department with complaints of progressively worsening chest pain and nausea. Patient describes chest pain as being left-sided with radiation to the right flank. Symptoms improved with Tums. Did not seem to be positional. Given worsening symptoms, patient decided to present to the emergency department   Assessment & Plan:   Principal Problem:   Chest pain Active Problems:   ESRD on dialysis ([place])   Diabetes mellitus ([place])   Constipation   Nausea   Essential hypertension   Chest pain -Initial troponin unremarkable. EKG is unremarkable -Chest pains historically improved with Tums -Patient is status post a recent negative stress test -Doubt cardiogenic chest pain. Instead, consider possible reflux disease -Continue patient with scheduled PPI -Follow serial troponin -2d echo without wall motion abnormality  End-stage renal disease -Nephrology consulted through the emergency department -Patient to resume Monday, Wednesday, Friday hemodialysis as scheduled  Diabetes mellitus -We'll continue patient with supplemental sliding scale insulin -For now, hold oral hyperglycemics while admitted  Constipation -Patient acknowledges a [place]-standing history of constipation. -Reports little to no results with suppositories and daily MiraLAX -some improvement with smog enema -Continue cathartics until good results  Nausea -Likely secondary to above constipation -Patient is status post cholecystectomy  Hypertension -At present, poorly controlled -Will increase nifedipine to '90mg'$    DVT prophylaxis: Heparin subQ Code Status: Full Family Communication: Pt in room, family at bedside Disposition  Plan: Possible d/c home w/in 24hrs   Consultants:   Nephrology  Procedures:     Antimicrobials:  Anti-infectives    None          Subjective: Feels better today. No results from [facility] enema this AM  Objective: Filed Vitals:   07/05/16 0639 07/05/16 0713 07/05/16 1209 07/05/16 1313  BP: 115/59 142/68 172/77 171/78  Pulse: 82 79 79 75  Temp: 98.7 F (37.1 C) 98.9 F (37.2 C)  98.1 F (36.7 C)  TempSrc: Oral Oral  Oral  Resp: '20 20  20  '$ Height:      Weight:  109.317 kg (241 lb)    SpO2: 98% 98% 99% 99%   No intake or output data in the 24 hours ending 07/05/16 1900 Filed Weights   07/04/16 0445 07/04/16 1340 07/05/16 0713  Weight: 106.595 kg (235 lb) 110.1 kg (242 lb 11.6 oz) 109.317 kg (241 lb)    Examination:  General exam: Appears calm and comfortable, laying in bed  Respiratory system: Clear to auscultation. Respiratory effort normal. Cardiovascular system: S1 & S2 heard, RRR. Gastrointestinal system: Abdomen is nondistended, soft and nontender. No organomegaly or masses felt. Normal bowel sounds heard. Central nervous system: Alert and oriented. No focal neurological deficits. Extremities: Symmetric 5 x 5 power. Skin: No rashes, lesions  Psychiatry: Judgement and insight appear normal. Mood & affect appropriate.     Data Reviewed: I have personally reviewed following labs and imaging studies  CBC:  Recent Labs Lab 07/04/16 0531  WBC 11.5*  NEUTROABS 8.9*  HGB 11.3*  HCT 34.9*  MCV 91.8  PLT 716   Basic Metabolic Panel:  Recent Labs Lab 07/04/16 0531  NA 135  K 5.2*  CL 101  CO2 22  GLUCOSE 257*  BUN 56*  CREATININE 10.64*  CALCIUM 10.0   GFR: Estimated Creatinine Clearance: 7.8  mL/min (by C-G formula based on Cr of 10.64). Liver Function Tests: No results for input(s): AST, ALT, ALKPHOS, BILITOT, PROT, ALBUMIN in the last 168 hours. No results for input(s): LIPASE, AMYLASE in the last 168 hours. No results for input(s):  AMMONIA in the last 168 hours. Coagulation Profile: No results for input(s): INR, PROTIME in the last 168 hours. Cardiac Enzymes:  Recent Labs Lab 07/04/16 0531  TROPONINI 0.04*   BNP (last 3 results) No results for input(s): PROBNP in the last 8760 hours. HbA1C: No results for input(s): HGBA1C in the last 72 hours. CBG:  Recent Labs Lab 07/04/16 1837 07/04/16 2024 07/05/16 0750 07/05/16 1147 07/05/16 1632  GLUCAP 91 188* 212* 278* 99   Lipid Profile: No results for input(s): CHOL, HDL, LDLCALC, TRIG, CHOLHDL, LDLDIRECT in the last 72 hours. Thyroid Function Tests: No results for input(s): TSH, T4TOTAL, FREET4, T3FREE, THYROIDAB in the last 72 hours. Anemia Panel: No results for input(s): VITAMINB12, FOLATE, FERRITIN, TIBC, IRON, RETICCTPCT in the last 72 hours. Sepsis Labs: No results for input(s): PROCALCITON, LATICACIDVEN in the last 168 hours.  Recent Results (from the past 240 hour(s))  MRSA PCR Screening     Status: None   Collection Time: 07/04/16 11:29 AM  Result Value Ref Range Status   MRSA by PCR NEGATIVE NEGATIVE Final    Comment:        The GeneXpert MRSA Assay (FDA approved for NASAL specimens only), is one component of a comprehensive MRSA colonization surveillance program. It is not intended to diagnose MRSA infection nor to guide or monitor treatment for MRSA infections.          Radiology Studies: Dg Chest 2 View  07/04/2016  CLINICAL DATA:  Shortness of breath.  Chest congestion. EXAM: CHEST  2 VIEW COMPARISON:  12/04/2015. FINDINGS: Mediastinum and hilar structures normal. Mild cardiomegaly and pulmonary venous congestion noted. No pulmonary edema or focal infiltrate. No pleural effusion or pneumothorax. IMPRESSION: Mild cardiomegaly pulmonary venous congestion. No pulmonary edema or focal infiltrate. Electronically Signed   By: [place]   On: 07/04/2016 06:42        Scheduled Meds: . aspirin  81 mg Oral Daily  . docusate  sodium  100 mg Oral BID  . epoetin (EPOGEN/PROCRIT) injection  2,000 Units Intravenous Q M,W,F-HD  . fluticasone  2 spray Each Nare Daily  . furosemide  40 mg Oral BID  . glipiZIDE  5 mg Oral BID AC  . heparin  5,000 Units Subcutaneous Q8H  . insulin aspart  0-9 Units Subcutaneous TID WC  . labetalol  300 mg Oral BID  . [START ON 07/06/2016] NIFEdipine  90 mg Oral Daily  . pantoprazole  40 mg Oral Daily  . polyethylene glycol-electrolytes  4,000 mL Oral Once  . rOPINIRole  2 mg Oral QHS  . sevelamer carbonate  2,400 mg Oral TID WC   Continuous Infusions:       CHIU, Orpah Melter, MD [facility] Pager [phone]  If 7PM-7AM, please contact night-coverage [email or web address] Password [email or web address] 07/05/2016, 7:00 PM

## 2016-07-05 NOTE — Progress Notes (Signed)
Patient states, "I do not want that SMOG enema again.  It made me sick last night."  Dr. Wyline Copas on unit and notified.  Patient had small results of BM after Lactulose dose earlier.  Patient requesting another dose. Dr. Wyline Copas notified and gave order for patient to receive Golytely.

## 2016-07-06 ENCOUNTER — Observation Stay (HOSPITAL_COMMUNITY): Payer: PRIVATE HEALTH INSURANCE

## 2016-07-06 DIAGNOSIS — I2510 Atherosclerotic heart disease of native coronary artery without angina pectoris: Secondary | ICD-10-CM | POA: Diagnosis present

## 2016-07-06 DIAGNOSIS — I209 Angina pectoris, unspecified: Secondary | ICD-10-CM | POA: Diagnosis not present

## 2016-07-06 DIAGNOSIS — K2950 Unspecified chronic gastritis without bleeding: Secondary | ICD-10-CM | POA: Diagnosis not present

## 2016-07-06 DIAGNOSIS — R1011 Right upper quadrant pain: Secondary | ICD-10-CM | POA: Diagnosis not present

## 2016-07-06 DIAGNOSIS — Z794 Long term (current) use of insulin: Secondary | ICD-10-CM | POA: Diagnosis not present

## 2016-07-06 DIAGNOSIS — I120 Hypertensive chronic kidney disease with stage 5 chronic kidney disease or end stage renal disease: Secondary | ICD-10-CM | POA: Diagnosis present

## 2016-07-06 DIAGNOSIS — R112 Nausea with vomiting, unspecified: Secondary | ICD-10-CM | POA: Insufficient documentation

## 2016-07-06 DIAGNOSIS — K5900 Constipation, unspecified: Secondary | ICD-10-CM

## 2016-07-06 DIAGNOSIS — R0789 Other chest pain: Secondary | ICD-10-CM | POA: Diagnosis not present

## 2016-07-06 DIAGNOSIS — K222 Esophageal obstruction: Secondary | ICD-10-CM | POA: Diagnosis not present

## 2016-07-06 DIAGNOSIS — K311 Adult hypertrophic pyloric stenosis: Secondary | ICD-10-CM | POA: Diagnosis not present

## 2016-07-06 DIAGNOSIS — R079 Chest pain, unspecified: Secondary | ICD-10-CM | POA: Diagnosis not present

## 2016-07-06 DIAGNOSIS — Z992 Dependence on renal dialysis: Secondary | ICD-10-CM

## 2016-07-06 DIAGNOSIS — E875 Hyperkalemia: Secondary | ICD-10-CM | POA: Diagnosis present

## 2016-07-06 DIAGNOSIS — N186 End stage renal disease: Secondary | ICD-10-CM

## 2016-07-06 DIAGNOSIS — K219 Gastro-esophageal reflux disease without esophagitis: Secondary | ICD-10-CM | POA: Diagnosis present

## 2016-07-06 DIAGNOSIS — E0801 Diabetes mellitus due to underlying condition with hyperosmolarity with coma: Secondary | ICD-10-CM

## 2016-07-06 DIAGNOSIS — R1032 Left lower quadrant pain: Secondary | ICD-10-CM | POA: Diagnosis not present

## 2016-07-06 DIAGNOSIS — R110 Nausea: Secondary | ICD-10-CM

## 2016-07-06 DIAGNOSIS — R1084 Generalized abdominal pain: Secondary | ICD-10-CM

## 2016-07-06 DIAGNOSIS — K5909 Other constipation: Secondary | ICD-10-CM | POA: Diagnosis not present

## 2016-07-06 DIAGNOSIS — E1300 Other specified diabetes mellitus with hyperosmolarity without nonketotic hyperglycemic-hyperosmolar coma (NKHHC): Secondary | ICD-10-CM

## 2016-07-06 DIAGNOSIS — E1122 Type 2 diabetes mellitus with diabetic chronic kidney disease: Secondary | ICD-10-CM | POA: Diagnosis present

## 2016-07-06 DIAGNOSIS — E871 Hypo-osmolality and hyponatremia: Secondary | ICD-10-CM | POA: Diagnosis not present

## 2016-07-06 DIAGNOSIS — K2970 Gastritis, unspecified, without bleeding: Secondary | ICD-10-CM | POA: Diagnosis not present

## 2016-07-06 DIAGNOSIS — Z9049 Acquired absence of other specified parts of digestive tract: Secondary | ICD-10-CM | POA: Diagnosis not present

## 2016-07-06 DIAGNOSIS — I10 Essential (primary) hypertension: Secondary | ICD-10-CM | POA: Diagnosis not present

## 2016-07-06 DIAGNOSIS — E8889 Other specified metabolic disorders: Secondary | ICD-10-CM | POA: Diagnosis present

## 2016-07-06 DIAGNOSIS — D631 Anemia in chronic kidney disease: Secondary | ICD-10-CM | POA: Diagnosis present

## 2016-07-06 LAB — GLUCOSE, CAPILLARY
Glucose-Capillary: 187 mg/dL — ABNORMAL HIGH (ref 65–99)
Glucose-Capillary: 122 mg/dL — ABNORMAL HIGH (ref 65–99)
Glucose-Capillary: 139 mg/dL — ABNORMAL HIGH (ref 65–99)

## 2016-07-06 LAB — RENAL FUNCTION PANEL
ALBUMIN: 3.7 g/dL (ref 3.5–5.0)
Anion gap: 14 (ref 5–15)
BUN: 46 mg/dL — AB (ref 6–20)
CALCIUM: 9.3 mg/dL (ref 8.9–10.3)
CO2: 24 mmol/L (ref 22–32)
Chloride: 98 mmol/L — ABNORMAL LOW (ref 101–111)
Creatinine, Ser: 10.07 mg/dL — ABNORMAL HIGH (ref 0.44–1.00)
GFR calc Af Amer: 5 mL/min — ABNORMAL LOW (ref >60)
GFR calc non Af Amer: 4 mL/min — ABNORMAL LOW (ref >60)
GLUCOSE: 240 mg/dL — AB (ref 65–99)
PHOSPHORUS: 5.6 mg/dL — AB (ref 2.5–4.6)
Potassium: 4.7 mmol/L (ref 3.5–5.1)
SODIUM: 136 mmol/L (ref 135–145)

## 2016-07-06 MED ORDER — SODIUM CHLORIDE 0.9 % IV SOLN: 100.0000 mL | INTRAVENOUS | Status: DC | PRN

## 2016-07-06 MED ORDER — BISACODYL 5 MG PO TBEC
10.0000 mg | DELAYED_RELEASE_TABLET | Freq: Every day | ORAL | Status: DC | PRN
  Administered 2016-07-06 – 2016-07-07 (×2): 10 mg via ORAL
  Filled 2016-07-06 (×2): qty 2

## 2016-07-06 MED ORDER — HEPARIN SODIUM (PORCINE) 1000 UNIT/ML DIALYSIS: 1000.0000 [IU] | INTRAMUSCULAR | Status: DC | PRN

## 2016-07-06 MED ORDER — HEPARIN SODIUM (PORCINE) 1000 UNIT/ML DIALYSIS: 20.0000 [IU]/kg | INTRAMUSCULAR | Status: DC | PRN

## 2016-07-06 MED ORDER — EPOETIN ALFA 4000 UNIT/ML IJ SOLN
INTRAMUSCULAR | Status: AC
  Administered 2016-07-06: 2000 [IU]
  Filled 2016-07-06: qty 1

## 2016-07-06 NOTE — Progress Notes (Signed)
Inpatient Diabetes Program Recommendations  AACE/ADA: New Consensus Statement on Inpatient Glycemic Control (2015)  Target Ranges:  Prepandial:   less than 140 mg/dL      Peak postprandial:   less than 180 mg/dL (1-2 hours)      Critically ill patients:  140 - 180 mg/dL   Results for Patricia [name], Patricia [name] (MRN [id number]) as of 07/06/2016 09:31  Ref. Range 07/05/2016 07:50 07/05/2016 11:47 07/05/2016 16:32 07/05/2016 21:53 07/06/2016 08:35  Glucose-Capillary Latest Ref Range: 65-99 mg/dL 212 (H) 278 (H) 99 266 (H) 187 (H)   Review of Glycemic Control  Diabetes history: DM2 Outpatient Diabetes medications: Glipizide 5 mg BID (when CBG > 150 mg/dl) Current orders for Inpatient glycemic control: Glipizide 5 mg BID, Novolog 0-9 units TID with meals  Inpatient Diabetes Program Recommendations: Correction (SSI): Please consider ordering Novolog bedtime correction scale.  Thanks, Barnie Alderman, RN, MSN, CDE Diabetes Coordinator Inpatient Diabetes Program [phone] (Team Pager from [place] to [place]) [phone] ([place] office) [phone] [facility] office) [phone] [facility] office)

## 2016-07-06 NOTE — Progress Notes (Signed)
PROGRESS NOTE    Patricia [name]  ZOX:[id number] DOB: 1971-03-13 DOA: 07/04/2016 PCP: [name]    Brief Narrative:  45 y.o. female with medical history significant of hypertension, diabetes, end-stage renal disease on Monday Wednesday Friday hemodialysis presents to the emergency department with complaints of progressively worsening chest pain and nausea. Patient describes chest pain as being left-sided with radiation to the right flank. Symptoms improved with Tums. Did not seem to be positional. Given worsening symptoms, patient decided to present to the emergency department   Assessment & Plan:   Principal Problem:   Chest pain Active Problems:   ESRD on dialysis ([place])   Diabetes mellitus ([place])   Constipation   Nausea   Essential hypertension   Chest pain -Troponin serially neg -Chest pains historically improved with Tums -Patient is status post a recent negative stress test -Doubt cardiogenic chest pain. Instead, consider possible reflux disease -Continue patient with scheduled PPI -2d echo without wall motion abnormality  End-stage renal disease -Nephrology consulted through the emergency department -Patient to continue Monday, Wednesday, Friday hemodialysis as scheduled  Diabetes mellitus -Will continue patient with supplemental sliding scale insulin  Abd pain with suspected Constipation -Patient acknowledges a [place]-standing history of constipation. -Reports little to no results with suppositories and daily MiraLAX -some improvement with smog enema x2 -this afternoon, pt noted to be hunched over in marked pain. Xray abd ordered, pending -Given acuity of pain, have consulted GI for assistance  Nausea -Likely secondary to above constipation -Patient is status post cholecystectomy  Hypertension -At present, poorly controlled -Will increase nifedipine to '90mg'$   DVT prophylaxis: Heparin subQ Code Status: Full Family Communication: Pt in room, family at  bedside Disposition Plan: Possible d/c home w/in 24hrs   Consultants:   Nephrology  Procedures:     Antimicrobials:  Anti-infectives    None          Subjective: Seen after dialysis. Patient complains of marked flank pain, sitting upright in bed  Objective: Filed Vitals:   07/06/16 1610 07/06/16 1615 07/06/16 1630 07/06/16 1640  BP: 98/66 134/66 124/63 145/74  Pulse: 77 74 71 73  Temp:      TempSrc:      Resp:      Height:      Weight:      SpO2:        Intake/Output Summary (Last 24 hours) at 07/06/16 1705 Last data filed at 07/06/16 1620  Gross per 24 hour  Intake      0 ml  Output   2828 ml  Net  -2828 ml   Filed Weights   07/04/16 0445 07/04/16 1340 07/05/16 0713  Weight: 106.595 kg (235 lb) 110.1 kg (242 lb 11.6 oz) 109.317 kg (241 lb)    Examination:  General exam: Appears to be in acute pain, sitting upright in bed  Respiratory system: Clear to auscultation. Respiratory effort normal. Cardiovascular system: S1 & S2 heard, RRR. Gastrointestinal system: Abdomen obese, tender, decreased BS. Central nervous system: Alert and oriented. No focal neurological deficits. Extremities: Symmetric 5 x 5 power. Skin: No rashes, lesions  Psychiatry: Judgement and insight appear normal. Mood & affect appropriate.     Data Reviewed: I have personally reviewed following labs and imaging studies  CBC:  Recent Labs Lab 07/04/16 0531  WBC 11.5*  NEUTROABS 8.9*  HGB 11.3*  HCT 34.9*  MCV 91.8  PLT 811   Basic Metabolic Panel:  Recent Labs Lab 07/04/16 0531 07/06/16 0433  NA 135 136  K 5.2* 4.7  CL 101 98*  CO2 22 24  GLUCOSE 257* 240*  BUN 56* 46*  CREATININE 10.64* 10.07*  CALCIUM 10.0 9.3  PHOS  --  5.6*   GFR: Estimated Creatinine Clearance: 8.3 mL/min (by C-G formula based on Cr of 10.07). Liver Function Tests:  Recent Labs Lab 07/06/16 0433  ALBUMIN 3.7   No results for input(s): LIPASE, AMYLASE in the last 168 hours. No  results for input(s): AMMONIA in the last 168 hours. Coagulation Profile: No results for input(s): INR, PROTIME in the last 168 hours. Cardiac Enzymes:  Recent Labs Lab 07/04/16 0531  TROPONINI 0.04*   BNP (last 3 results) No results for input(s): PROBNP in the last 8760 hours. HbA1C: No results for input(s): HGBA1C in the last 72 hours. CBG:  Recent Labs Lab 07/05/16 1147 07/05/16 1632 07/05/16 2153 07/06/16 0835 07/06/16 1100  GLUCAP 278* 99 266* 187* 122*   Lipid Profile: No results for input(s): CHOL, HDL, LDLCALC, TRIG, CHOLHDL, LDLDIRECT in the last 72 hours. Thyroid Function Tests: No results for input(s): TSH, T4TOTAL, FREET4, T3FREE, THYROIDAB in the last 72 hours. Anemia Panel: No results for input(s): VITAMINB12, FOLATE, FERRITIN, TIBC, IRON, RETICCTPCT in the last 72 hours. Sepsis Labs: No results for input(s): PROCALCITON, LATICACIDVEN in the last 168 hours.  Recent Results (from the past 240 hour(s))  MRSA PCR Screening     Status: None   Collection Time: 07/04/16 11:29 AM  Result Value Ref Range Status   MRSA by PCR NEGATIVE NEGATIVE Final    Comment:        The GeneXpert MRSA Assay (FDA approved for NASAL specimens only), is one component of a comprehensive MRSA colonization surveillance program. It is not intended to diagnose MRSA infection nor to guide or monitor treatment for MRSA infections.          Radiology Studies: No results found.      Scheduled Meds: . aspirin  81 mg Oral Daily  . docusate sodium  100 mg Oral BID  . epoetin (EPOGEN/PROCRIT) injection  2,000 Units Intravenous Q M,W,F-HD  . fluticasone  2 spray Each Nare Daily  . furosemide  40 mg Oral BID  . glipiZIDE  5 mg Oral BID AC  . heparin  5,000 Units Subcutaneous Q8H  . insulin aspart  0-9 Units Subcutaneous TID WC  . labetalol  300 mg Oral BID  . NIFEdipine  90 mg Oral Daily  . pantoprazole  40 mg Oral Daily  . polyethylene glycol-electrolytes  4,000 mL  Oral Once  . rOPINIRole  2 mg Oral QHS  . sevelamer carbonate  2,400 mg Oral TID WC   Continuous Infusions:    LOS: 0 days    Paytience Bures, Orpah Melter, MD [facility] Pager [phone]  If 7PM-7AM, please contact night-coverage [email or web address] Password [facility] 07/06/2016, 5:05 PM

## 2016-07-06 NOTE — Progress Notes (Signed)
Subjective: Interval History: Patient continued to complains of nausea and some epigastric discomfort. She didn't have any vomiting today. She was able to move her bowels yesterday. .  Objective: Vital signs in last 24 hours: Temp:  [98.1 F (36.7 C)-98.3 F (36.8 C)] 98.3 F (36.8 C) (07/21 0453) Pulse Rate:  [75-87] 79 (07/21 0453) Resp:  [18-21] 21 (07/21 0453) BP: (151-176)/(75-78) 173/76 mmHg (07/21 0758) SpO2:  [98 %-100 %] 100 % (07/21 0453) Weight change: -0.783 kg (-1 lb 11.6 oz)  Intake/Output from previous day:   Intake/Output this shift:    General appearance: alert, cooperative and no distress Resp: clear to auscultation bilaterally Cardio: regular rate and rhythm, S1, S2 normal, no murmur, click, rub or gallop GI: soft, non-tender; bowel sounds normal; no masses,  no organomegaly Extremities: No edema  Lab Results:  Recent Labs  07/04/16 0531  WBC 11.5*  HGB 11.3*  HCT 34.9*  PLT 303   BMET:   Recent Labs  07/04/16 0531 07/06/16 0433  NA 135 136  K 5.2* 4.7  CL 101 98*  CO2 22 24  GLUCOSE 257* 240*  BUN 56* 46*  CREATININE 10.64* 10.07*  CALCIUM 10.0 9.3   No results for input(s): PTH in the last 72 hours. Iron Studies: No results for input(s): IRON, TIBC, TRANSFERRIN, FERRITIN in the last 72 hours.  Studies/Results: No results found.  I have reviewed the patient's current medications.  Assessment/Plan: Problem #1 abdominal pain: Patient continued to have nausea. At this moment etiology not clear. Since she has epigastric pain and previous history of ulcer at this moment GERD need to be considered. Patient on Protonix as an outpatient without significant improvement. Problem # end-stage renal disease: She is status post hemodialysis on Wednesday. Her potassium is good. Problem #3 anemia: Her hemoglobin is within our target goal Problem #4 history of hypertension: Her blood pressure seems to have improved and presently within reasonable  range Problem #5 metabolic bone disease: Calcium is range But her phosphorus is slightly high. Patient presently on Renvela. Problem #6 fluid management: Presently she doesn't have any sign of fluid over load Problem #7 history of constipation. Patient able to move her bowels one time and presently she doesn't offer any complaints. Plan:1]] we'll make arrangement for patient to get dialysis today. 2] we'll dialyze for 4 hours and remove with 3 L if her blood pressures tolerates. 3] because of her recurrent abdominal pain and also nausea at this moment patient may benefit from GI consult.       Kadey Mihalic S 07/06/2016,8:08 AM

## 2016-07-06 NOTE — Care Management Note (Signed)
Case Management Note  Patient Details  Name: Autie Vasudevan MRN: [id number] Date of Birth: [birth year]  Subjective/Objective:   Patient from home, ind. HD on MWF schedule. Has PCP and medicare.   Action/Plan: DC home with self care. No CM needs. Expected Discharge Date:  07/06/16               Expected Discharge Plan:  Home/Self Care  In-House Referral:  NA  Discharge planning Services  CM Consult  Post Acute Care Choice:  NA Choice offered to:  NA  DME Arranged:    DME Agency:     HH Arranged:    HH Agency:     Status of Service:  Completed, signed off  If discussed at [employer] of Stay Meetings, dates discussed:    Additional Comments:  Latrell Potempa, Chauncey Reading, RN 07/06/2016, 1:15 PM

## 2016-07-06 NOTE — Consult Note (Signed)
Referring Provider: [facility] Primary Care Physician:  [name] Primary Gastroenterologist:  Dr. Gala Romney  Date of Admission: 07/04/16 Date of Consultation: 07/06/16  Reason for Consultation:  Abdominal pain  HPI:  Patricia [name] is a 45 y.o. female with a past medical history of hypertension, renal insufficiency, dialysis dependence, diabetes. She initially presented to the ED 7/19 with chest pain and nausea withsymptoms left-sided and improved with TUMS. In the ED chest pain improved with ntg, troponin 0.04 and unremarkable EKG. Admitted [place]-standing history of constipation. She admitted little to no results for suppositories and daily MiraLAX. She was continued on cathartics and given smog enemas. She had some improvement with the enemas. Today, per Dr. Wyline Copas, she has continued significant abdominal pain without substantial results from enemas, dulcolax, colace, sorbitol. Had GoLytely prep ordered but does not appear to have been given. Abdominal XRay ordered this morning for stool burden/constipation but has not been completed yet because patient has been in dialysis. She is currently on narcotic pain medication as an inpatient  Patient is somewhat of a difficult historian. She states she does not have chronic constipation. Has significant GERD symptoms which is helped with TUMS/Milk of Mag, however she's been told she can no longer take this due to her electrolytes and ESRD/dialysis. 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 Has persistent abdominal pain which she states "moves all over the place" and points to different parts of her whole abdomen. Has had a small and a scant bowel movement in the past several days. Also ntoes significant nause and vomiting. Usually vomiting is associated with cough and drainage, although will also have vomiting not associated with cough/allergies. Denies hematochezia, melena. No other upper or lower GI symptoms.   Past  Medical History  Diagnosis Date  . Hypertension   . Diabetes mellitus without complication ([place])   . Blood transfusion without reported diagnosis   . Dialysis patient ([place])     mon, wed, friday,   . Renal disorder   . Renal insufficiency   . Cancer [facility])     Past Surgical History  Procedure Laterality Date  . Cholecystectomy    . Cesarean section    . Dilation and curettage of uterus    . Abdominal hysterectomy      Prior to Admission medications   Medication Sig Start Date End Date Taking? Authorizing Provider  aspirin 81 MG tablet Take 81 mg by mouth daily.   Yes Historical Provider, MD  furosemide (LASIX) 40 MG tablet Take 40-80 mg by mouth 2 (two) times daily.    Yes Historical Provider, MD  glipiZIDE (GLUCOTROL) 5 MG tablet Take 5 mg by mouth 2 (two) times daily before a meal.   Yes Historical Provider, MD  labetalol (NORMODYNE) 300 MG tablet Take 1 tablet (300 mg total) by mouth 2 (two) times daily. 12/06/15  Yes Erline Hau, MD  NIFEdipine (PROCARDIA-XL/ADALAT CC) 60 MG 24 hr tablet Take 1 tablet (60 mg total) by mouth daily. 12/06/15  Yes Erline Hau, MD  rOPINIRole (REQUIP) 2 MG tablet Take 2 mg by mouth at bedtime.   Yes Historical Provider, MD  sevelamer carbonate (RENVELA) 800 MG tablet Take 3,200 mg by mouth 3 (three) times daily with meals. & 800 mg with snacks   Yes Historical Provider, MD  B Complex-C-Folic Acid (DIALYVITE 161) 0.8 MG TABS Take 1 tablet by mouth daily. 12/02/15   Historical Provider, MD    Current  Facility-Administered Medications  Medication Dose Route Frequency Provider Last Rate Last Dose  . 0.9 %  sodium chloride infusion  100 mL Intravenous PRN Manpreet S Bhutani, MD      . 0.9 %  sodium chloride infusion  100 mL Intravenous PRN Manpreet S Bhutani, MD      . 0.9 %  sodium chloride infusion  100 mL Intravenous PRN Fran Lowes, MD      . 0.9 %  sodium chloride infusion  100 mL Intravenous PRN Fran Lowes,  MD      . acetaminophen (TYLENOL) tablet 650 mg  650 mg Oral Q4H PRN Donne Hazel, MD   650 mg at 07/06/16 1130  . alteplase (CATHFLO ACTIVASE) injection 2 mg  2 mg Intracatheter Once PRN Liana Gerold, MD      . aspirin chewable tablet 81 mg  81 mg Oral Daily Donne Hazel, MD   81 mg at 07/06/16 0801  . bisacodyl (DULCOLAX) suppository 10 mg  10 mg Rectal Daily PRN Fran Lowes, MD      . docusate sodium (COLACE) capsule 100 mg  100 mg Oral BID Donne Hazel, MD   100 mg at 07/06/16 0801  . epoetin alfa (EPOGEN,PROCRIT) injection 2,000 Units  2,000 Units Intravenous Q M,W,F-HD Liana Gerold, MD   0 Units at 07/04/16 1735  . fluticasone (FLONASE) 50 MCG/ACT nasal spray 2 spray  2 spray Each Nare Daily Donne Hazel, MD   2 spray at 07/06/16 0802  . furosemide (LASIX) tablet 40 mg  40 mg Oral BID Donne Hazel, MD   40 mg at 07/06/16 0756  . glipiZIDE (GLUCOTROL) tablet 5 mg  5 mg Oral BID AC Donne Hazel, MD   5 mg at 07/06/16 0756  . heparin injection 1,000 Units  1,000 Units Dialysis PRN Fran Lowes, MD      . heparin injection 2,100 Units  20 Units/kg Dialysis PRN Liana Gerold, MD      . heparin injection 2,200 Units  20 Units/kg Dialysis PRN Fran Lowes, MD      . heparin injection 5,000 Units  5,000 Units Subcutaneous Q8H Donne Hazel, MD   5,000 Units at 07/06/16 0630  . insulin aspart (novoLOG) injection 0-9 Units  0-9 Units Subcutaneous TID WC Donne Hazel, MD   0 Units at 07/04/16 1200  . labetalol (NORMODYNE) tablet 300 mg  300 mg Oral BID Donne Hazel, MD   300 mg at 07/06/16 0801  . lidocaine (PF) (XYLOCAINE) 1 % injection 5 mL  5 mL Intradermal PRN Manpreet Toya Smothers, MD      . lidocaine-prilocaine (EMLA) cream 1 application  1 application Topical PRN Manpreet Toya Smothers, MD      . morphine 2 MG/ML injection 2 mg  2 mg Intravenous Q2H PRN Donne Hazel, MD      . NIFEdipine (PROCARDIA-XL/ADALAT-CC/NIFEDICAL-XL) 24 hr tablet 90 mg  90 mg  Oral Daily Donne Hazel, MD   90 mg at 07/06/16 8657  . nitroGLYCERIN (NITROSTAT) SL tablet 0.4 mg  0.4 mg Sublingual Q5 min PRN Ripley Fraise, MD   0.4 mg at 07/04/16 0649  . ondansetron (ZOFRAN) injection 4 mg  4 mg Intravenous Q6H PRN Donne Hazel, MD   4 mg at 07/06/16 0809  . pantoprazole (PROTONIX) EC tablet 40 mg  40 mg Oral Daily Donne Hazel, MD   40 mg at 07/06/16 8469  . pentafluoroprop-tetrafluoroeth (GEBAUERS)  aerosol 1 application  1 application Topical PRN Manpreet Toya Smothers, MD      . polyethylene glycol-electrolytes (NuLYTELY/GoLYTELY) solution 4,000 mL  4,000 mL Oral Once Donne Hazel, MD   4,000 mL at 07/05/16 1600  . rOPINIRole (REQUIP) tablet 2 mg  2 mg Oral QHS Donne Hazel, MD   2 mg at 07/05/16 2325  . sevelamer carbonate (RENVELA) tablet 2,400 mg  2,400 mg Oral TID WC Donne Hazel, MD   2,400 mg at 07/05/16 1728  . sorbitol 70 % solution 30 mL  30 mL Oral Daily PRN Fran Lowes, MD        Allergies as of 07/04/2016 - Review Complete 07/04/2016  Allergen Reaction Noted  . Reglan [metoclopramide] Other (See Comments) 12/04/2015  . Amlodipine besylate Rash and Other (See Comments) 12/04/2015    History reviewed. No pertinent family history.  Social History   Social History  . Marital Status: Married    Spouse Name: N/A  . Number of Children: N/A  . Years of Education: N/A   Occupational History  . Not on file.   Social History Main Topics  . Smoking status: Never Smoker   . Smokeless tobacco: Never Used  . Alcohol Use: No  . Drug Use: No  . Sexual Activity: Not on file   Other Topics Concern  . Not on file   Social History Narrative    Review of Systems: General: Negative for anorexia, weight loss, fever, chills, fatigue, weakness. ENT: Admits nasal congestion, drainage, and allergies. CV: Negative for angina, palpitations, peripheral edema. Admits left sided chest pain (points to left ribs) Respiratory: Negative for dyspnea at  rest, cough, sputum, wheezing.  GI: See history of present illness. Endo: Negative for unusual weight change.  Heme: Negative for bruising or bleeding.  Physical Exam: Vital signs in last 24 hours: Temp:  [98.2 F (36.8 C)-98.3 F (36.8 C)] 98.2 F (36.8 C) (07/21 1215) Pulse Rate:  [71-87] 71 (07/21 1630) Resp:  [16-21] 16 (07/21 1215) BP: (92-180)/(47-96) 124/63 mmHg (07/21 1630) SpO2:  [98 %-100 %] 100 % (07/21 1215) Last BM Date: 07/06/16 General:   Morbidly obese female alert, Well-developed, well-nourished, pleasant and cooperative in NAD Head:  Normocephalic and atraumatic. Eyes:  Sclera clear, no icterus. Conjunctiva pink. Ears:  Normal auditory acuity. Lungs:  Clear throughout to auscultation. No wheezes, crackles, or rhonchi. No acute distress. Heart:  Regular rate and rhythm; no murmurs, clicks, rubs,  or gallops. Abdomen:  Obese but soft and nondistended. Noted TTP lower abdomen and epigastric areas. No obvious masses, hepatosplenomegaly or hernias noted. Normal bowel sounds, without guarding, and without rebound.   Rectal:  Deferred.   Pulses:  Normal bilateral DP pulses noted. Extremities:  Without edema. Neurologic:  Alert and oriented x4;  grossly normal neurologically. Psych:  Alert and cooperative. Normal mood and affect. Seems anxious/hyperactive, low attention span.  Intake/Output from previous day:   Intake/Output this shift: Total I/O In: -  Out: 2828 [Other:2828]  Lab Results:  Recent Labs  07/04/16 0531  WBC 11.5*  HGB 11.3*  HCT 34.9*  PLT 303   BMET  Recent Labs  07/04/16 0531 07/06/16 0433  NA 135 136  K 5.2* 4.7  CL 101 98*  CO2 22 24  GLUCOSE 257* 240*  BUN 56* 46*  CREATININE 10.64* 10.07*  CALCIUM 10.0 9.3   LFT  Recent Labs  07/06/16 0433  ALBUMIN 3.7   PT/INR No results for input(s): LABPROT, INR in  the last 72 hours. Hepatitis Panel  Recent Labs  07/04/16 1525  HEPBSAG Negative   C-Diff No results for  input(s): CDIFFTOX in the last 72 hours.  Studies/Results: No results found.  Impression: 45 year old female with a history of hypertension, GERD, diabetes, ESRD on hemodialysis with persistent abdominal pain, N/V, constipation, GERD symptoms. Her abdominal pain and nausea is likely, at least in part, due to constipation. They have tried multiple approaches for bowel movement with minimal results. She has refused GoLytely.   Likely uncontrolled GERD symptoms contributing to abdominal pain, N/V. She takes protonix on intermittent bases at home. She is currently on Protonix 40 mg daily and Zofran prn.  With diabetes and ESRD should also consider gastroparesis as a diabetic complication contributing to N/V not associated with drainage and allergies.  The patient is in [place] now which will be revealing as to the extent of her constipation and to rule out any developing obstruction.   Plan: 1. Await results of abdominal XRay. 2. If no obstruction, start Linzess 290 mcg 3. Increase Protonix to 40 mg bid for now 4. Can add Carafate or viscous lidocaine for epigastric/upper abdominal pain 5. Schedule Zofran q 6 hours 6. Consider GES at some point (inpatient vs outpatient) for evaluation of gastroparesis    Walden Field, AGNP-C Adult & Gerontological Nurse Practitioner [facility]    LOS: 0 days     07/06/2016, 4:45 PM

## 2016-07-06 NOTE — Procedures (Signed)
   HEMODIALYSIS TREATMENT NOTE:  4 hour low-heparin dialysis completed via left upper arm AVF (15g/antegrade). Goal nearly met; pt experienced symptomatic hypotension (dyspnea, diaphoresis) 92/47 near end of HD session.  Pt placed in t'berg, UF discontinued, NS bolus given.  Lying flat exacerbated her abdominal pain.  BP normalized with 300cc NS bolus, but the abdominal pain became more pronounced with the change in position.  Pt scheduled for abdominal x-rays post HD.  NET UF 2.8 liters (goal 3L). All blood returned and hemostasis achieved within 15 minutes. Report called to [place], Therapist, sports.Marland Kitchen  [place]well [place], RN, CDN

## 2016-07-07 LAB — GLUCOSE, CAPILLARY
GLUCOSE-CAPILLARY: 143 mg/dL — AB (ref 65–99)
GLUCOSE-CAPILLARY: 141 mg/dL — AB (ref 65–99)
Glucose-Capillary: 170 mg/dL — ABNORMAL HIGH (ref 65–99)
Glucose-Capillary: 99 mg/dL (ref 65–99)

## 2016-07-07 MED ORDER — PROMETHAZINE HCL 25 MG RE SUPP
25.0000 mg | RECTAL | Status: DC | PRN
  Administered 2016-07-07: 25 mg via RECTAL
  Filled 2016-07-07: qty 1

## 2016-07-07 MED ORDER — PEG-KCL-NACL-NASULF-NA ASC-C 100 G PO SOLR: 1.0000 | Freq: Once | ORAL | Status: DC

## 2016-07-07 MED ORDER — PEG 3350-KCL-NA BICARB-NACL 420 G PO SOLR
4000.0000 mL | Freq: Once | ORAL | Status: AC
  Administered 2016-07-07: 4000 mL via ORAL
  Filled 2016-07-07: qty 4000

## 2016-07-07 NOTE — Progress Notes (Signed)
PROGRESS NOTE    Patricia [name]  OFH:[id number] DOB: 10-26-1971 DOA: 07/04/2016 PCP: [name]    Brief Narrative:  45 y.o. female with medical history significant of hypertension, diabetes, end-stage renal disease on Monday Wednesday Friday hemodialysis presents to the emergency department with complaints of progressively worsening chest pain and nausea. Patient describes chest pain as being left-sided with radiation to the right flank. Symptoms improved with Tums. Did not seem to be positional. Given worsening symptoms, patient decided to present to the emergency department   Assessment & Plan:   Principal Problem:   Chest pain Active Problems:   ESRD on dialysis ([place])   Diabetes mellitus ([place])   Constipation   Nausea   Essential hypertension   Nausea with vomiting   Generalized abdominal pain   Chest pain -Troponin serially neg -Chest pains historically improved with Tums -Patient is status post a recent negative stress test -Doubt cardiogenic chest pain. Instead, consider possible reflux disease -Continue patient with scheduled PPI -2d echo without wall motion abnormality  End-stage renal disease -Nephrology consulted through the emergency department -Patient to continue Monday, Wednesday, Friday hemodialysis as scheduled  Diabetes mellitus -Will continue patient with supplemental sliding scale insulin  Abd pain with suspected Constipation -Patient acknowledges a [place]-standing history of constipation. -Reports little to no results with suppositories and daily MiraLAX -some improvement with smog enema x2 -07/06/2016, patient noted to be in marked pain, hunched over -Follow-up abdominal x-ray confirmed large stool within the colon. Gastroenterology was consulted. Recommendation for possible Linzess -At this time, patient is being given a trial of GoLYTELY bowel prep. We'll follow-up on results.  Nausea -Likely secondary to above constipation -Patient is  status post cholecystectomy  Hypertension -At present, poorly controlled -Possibly secondary to uncontrolled abdominal pain per above -Increased nifedipine to '90mg'$   DVT prophylaxis: Heparin subQ Code Status: Full Family Communication: Pt in room, family at bedside Disposition Plan: Possible d/c home when patient improves   Consultants:   Nephrology  Procedures:     Antimicrobials:  Anti-infectives    None          Subjective: Complains of continued abdominal pain and nausea  Objective: Filed Vitals:   07/06/16 2119 07/07/16 0500 07/07/16 0923 07/07/16 1406  BP: 114/58 130/67 133/97 151/53  Pulse: 80 72 87 80  Temp: 98.1 F (36.7 C) 98 F (36.7 C) 98.9 F (37.2 C) 97.4 F (36.3 C)  TempSrc: Oral Oral Oral Oral  Resp: '18 18  19  '$ Height:      Weight:      SpO2: 98% 99%  96%    Intake/Output Summary (Last 24 hours) at 07/07/16 1652 Last data filed at 07/07/16 1205  Gross per 24 hour  Intake    240 ml  Output      0 ml  Net    240 ml   Filed Weights   07/04/16 0445 07/04/16 1340 07/05/16 0713  Weight: 106.595 kg (235 lb) 110.1 kg (242 lb 11.6 oz) 109.317 kg (241 lb)    Examination:  General exam: Appears uncomfortable, lying in bed Respiratory system: Clear to auscultation. Respiratory effort normal. Cardiovascular system: S1 & S2 heard, RRR. Gastrointestinal system: Abdomen obese, tender, decreased BS. Central nervous system: Alert and oriented. No focal neurological deficits. Extremities: Symmetric 5 x 5 power. Skin: No rashes, lesions  Psychiatry: Judgement and insight appear normal. Mood & affect appropriate.     Data Reviewed: I have personally reviewed following labs and imaging studies  CBC:  Recent  Labs Lab 07/04/16 0531  WBC 11.5*  NEUTROABS 8.9*  HGB 11.3*  HCT 34.9*  MCV 91.8  PLT 008   Basic Metabolic Panel:  Recent Labs Lab 07/04/16 0531 07/06/16 0433  NA 135 136  K 5.2* 4.7  CL 101 98*  CO2 22 24  GLUCOSE 257*  240*  BUN 56* 46*  CREATININE 10.64* 10.07*  CALCIUM 10.0 9.3  PHOS  --  5.6*   GFR: Estimated Creatinine Clearance: 8.3 mL/min (by C-G formula based on Cr of 10.07). Liver Function Tests:  Recent Labs Lab 07/06/16 0433  ALBUMIN 3.7   No results for input(s): LIPASE, AMYLASE in the last 168 hours. No results for input(s): AMMONIA in the last 168 hours. Coagulation Profile: No results for input(s): INR, PROTIME in the last 168 hours. Cardiac Enzymes:  Recent Labs Lab 07/04/16 0531  TROPONINI 0.04*   BNP (last 3 results) No results for input(s): PROBNP in the last 8760 hours. HbA1C: No results for input(s): HGBA1C in the last 72 hours. CBG:  Recent Labs Lab 07/06/16 1100 07/06/16 2048 07/07/16 0806 07/07/16 1141 07/07/16 1623  GLUCAP 122* 139* 143* 141* 170*   Lipid Profile: No results for input(s): CHOL, HDL, LDLCALC, TRIG, CHOLHDL, LDLDIRECT in the last 72 hours. Thyroid Function Tests: No results for input(s): TSH, T4TOTAL, FREET4, T3FREE, THYROIDAB in the last 72 hours. Anemia Panel: No results for input(s): VITAMINB12, FOLATE, FERRITIN, TIBC, IRON, RETICCTPCT in the last 72 hours. Sepsis Labs: No results for input(s): PROCALCITON, LATICACIDVEN in the last 168 hours.  Recent Results (from the past 240 hour(s))  MRSA PCR Screening     Status: None   Collection Time: 07/04/16 11:29 AM  Result Value Ref Range Status   MRSA by PCR NEGATIVE NEGATIVE Final    Comment:        The GeneXpert MRSA Assay (FDA approved for NASAL specimens only), is one component of a comprehensive MRSA colonization surveillance program. It is not intended to diagnose MRSA infection nor to guide or monitor treatment for MRSA infections.          Radiology Studies: Dg Abd 1 View  07/06/2016  CLINICAL DATA:  Patient with generalized abdominal pain and cramping for 1 month. EXAM: ABDOMEN - 1 VIEW COMPARISON:  MRI abdomen 11/23/2013 FINDINGS: Lung bases are clear.  Cholecystectomy clips. Gas is demonstrated within nondilated loops of large and small bowel in a nonobstructed pattern. There is a large amount of stool within the cecum, ascending and transverse colon. Supine evaluation limited for the detection of free intraperitoneal air. Osseous skeleton is unremarkable. Vascular calcifications. IMPRESSION: Nonobstructed bowel gas pattern. Large amount of stool within the cecum, ascending and transverse colon, compatible with constipation. Electronically Signed   By: Lovey Newcomer M.D.   On: 07/06/2016 19:16        Scheduled Meds: . aspirin  81 mg Oral Daily  . docusate sodium  100 mg Oral BID  . epoetin (EPOGEN/PROCRIT) injection  2,000 Units Intravenous Q M,W,F-HD  . fluticasone  2 spray Each Nare Daily  . furosemide  40 mg Oral BID  . glipiZIDE  5 mg Oral BID AC  . heparin  5,000 Units Subcutaneous Q8H  . insulin aspart  0-9 Units Subcutaneous TID WC  . labetalol  300 mg Oral BID  . NIFEdipine  90 mg Oral Daily  . pantoprazole  40 mg Oral Daily  . rOPINIRole  2 mg Oral QHS  . sevelamer carbonate  2,400 mg Oral TID WC  Continuous Infusions:    LOS: 1 day    CHIU, Orpah Melter, MD [facility] Pager [phone]  If 7PM-7AM, please contact night-coverage [email or web address] Password [facility] 07/07/2016, 4:52 PM

## 2016-07-07 NOTE — Progress Notes (Signed)
Pt c/o nausea. No emesis at the time. Report that Zofran make her feel worse. Requested phenergan . Informed pt that she only had Zofran ordered. Will place call to MD

## 2016-07-07 NOTE — Progress Notes (Signed)
Subjective: Interval History: Patient continued to complain about epigastric pain which is moving around. Decision of some nausea but no vomiting. Presently she was not able to move her bowels. .  Objective: Vital signs in last 24 hours: Temp:  [98 F (36.7 C)-98.9 F (37.2 C)] 98.9 F (37.2 C) (07/22 0923) Pulse Rate:  [71-87] 87 (07/22 0923) Resp:  [16-18] 18 (07/22 0500) BP: (92-180)/(47-97) 133/97 mmHg (07/22 0923) SpO2:  [98 %-100 %] 99 % (07/22 0500) Weight change:   Intake/Output from previous day: 07/21 0701 - 07/22 0700 In: -  Out: 2828  Intake/Output this shift:    General appearance: alert, cooperative and no distress Resp: clear to auscultation bilaterally Cardio: regular rate and rhythm, S1, S2 normal, no murmur, click, rub or gallop GI: soft, non-tender; bowel sounds normal; no masses,  no organomegaly Extremities: No edema  Lab Results: No results for input(s): WBC, HGB, HCT, PLT in the last 72 hours. BMET:   Recent Labs  07/06/16 0433  NA 136  K 4.7  CL 98*  CO2 24  GLUCOSE 240*  BUN 46*  CREATININE 10.07*  CALCIUM 9.3   No results for input(s): PTH in the last 72 hours. Iron Studies: No results for input(s): IRON, TIBC, TRANSFERRIN, FERRITIN in the last 72 hours.  Studies/Results: Dg Abd 1 View  07/06/2016  CLINICAL DATA:  Patient with generalized abdominal pain and cramping for 1 month. EXAM: ABDOMEN - 1 VIEW COMPARISON:  MRI abdomen 11/23/2013 FINDINGS: Lung bases are clear. Cholecystectomy clips. Gas is demonstrated within nondilated loops of large and small bowel in a nonobstructed pattern. There is a large amount of stool within the cecum, ascending and transverse colon. Supine evaluation limited for the detection of free intraperitoneal air. Osseous skeleton is unremarkable. Vascular calcifications. IMPRESSION: Nonobstructed bowel gas pattern. Large amount of stool within the cecum, ascending and transverse colon, compatible with constipation.  Electronically Signed   By: Lovey Newcomer M.D.   On: 07/06/2016 19:16    I have reviewed the patient's current medications.  Assessment/Plan: Problem #1 abdominal pain: Patient continued to have nauseaAnd abdominal pain. Patient is being followed by GI. Problem # end-stage renal disease: She is status post hemodialysis yesterday. Problem #3 anemia: Her hemoglobin is within our target goal Problem #4 history of hypertension: Her blood pressure seems to have improved and presently within reasonable range Problem #5 metabolic bone disease: Calcium is range ,but  her phosphorus is slightly high. Patient presently on Renvela. Problem #6 fluid management: Present status post dialysis yesterday. Very able to move about 2.8 L and presently she doesn't have any difficulty breathing. Problem #7 history of constipation. Still are resolved Plan:1] patient presently doesn't require any dialysis. 2] will check CBC and renal panel in the morning. 3] her next dialysis will be on Monday which is her regular schedule.   LOS: 1 day   Tatanisha Cuthbert S 07/07/2016,10:04 AM

## 2016-07-07 NOTE — Progress Notes (Signed)
Patient c/o having as pains and still feeling constipated. CT of abdomen confirmed patient is still constipated. Encouraged patient to get OOB and ambulate in hall to stimulate peristalsis, but patient refused. Explained to patient that the pain that she is experiencing is from gas and that's why it keeps moving. Also explained that the narcotic pain medication is only making the constipation worse by slowing down the peristalsis. Patient states that at home she takes Bisacodyl daily as instructed by her PCP. Notified provider on call of home medication and it was ordered.  RN gave PRN Sorbitol, bisacodyl, and Zofran and encouraged patient to drink apple juice or cranberry juice to help draw the water to the bowel. Will continue to monitor effectiveness and follow plan of care.

## 2016-07-08 DIAGNOSIS — R1084 Generalized abdominal pain: Secondary | ICD-10-CM

## 2016-07-08 DIAGNOSIS — K5900 Constipation, unspecified: Secondary | ICD-10-CM

## 2016-07-08 DIAGNOSIS — R112 Nausea with vomiting, unspecified: Secondary | ICD-10-CM

## 2016-07-08 LAB — GLUCOSE, CAPILLARY
GLUCOSE-CAPILLARY: 164 mg/dL — AB (ref 65–99)
GLUCOSE-CAPILLARY: 138 mg/dL — AB (ref 65–99)
Glucose-Capillary: 90 mg/dL (ref 65–99)
Glucose-Capillary: 103 mg/dL — ABNORMAL HIGH (ref 65–99)

## 2016-07-08 LAB — RENAL FUNCTION PANEL
ALBUMIN: 3.9 g/dL (ref 3.5–5.0)
Anion gap: 14 (ref 5–15)
BUN: 40 mg/dL — AB (ref 6–20)
CALCIUM: 9.4 mg/dL (ref 8.9–10.3)
CO2: 23 mmol/L (ref 22–32)
Chloride: 97 mmol/L — ABNORMAL LOW (ref 101–111)
Creatinine, Ser: 10.55 mg/dL — ABNORMAL HIGH (ref 0.44–1.00)
GFR calc Af Amer: 4 mL/min — ABNORMAL LOW (ref >60)
GFR, EST NON AFRICAN AMERICAN: 4 mL/min — AB (ref >60)
GLUCOSE: 182 mg/dL — AB (ref 65–99)
PHOSPHORUS: 5.1 mg/dL — AB (ref 2.5–4.6)
POTASSIUM: 4.7 mmol/L (ref 3.5–5.1)
SODIUM: 134 mmol/L — AB (ref 135–145)

## 2016-07-08 LAB — CBC
HEMATOCRIT: 36.8 % (ref 36.0–46.0)
HEMOGLOBIN: 11.7 g/dL — AB (ref 12.0–15.0)
MCH: 29.9 pg (ref 26.0–34.0)
MCHC: 31.8 g/dL (ref 30.0–36.0)
MCV: 94.1 fL (ref 78.0–100.0)
Platelets: 305 10*3/uL (ref 150–400)
RBC: 3.91 MIL/uL (ref 3.87–5.11)
RDW: 14.8 % (ref 11.5–15.5)
WBC: 10.1 10*3/uL (ref 4.0–10.5)

## 2016-07-08 MED ORDER — LINACLOTIDE 145 MCG PO CAPS
145.0000 ug | ORAL_CAPSULE | Freq: Every day | ORAL | Status: DC
  Administered 2016-07-08 – 2016-07-11 (×4): 145 ug via ORAL
  Filled 2016-07-08 (×4): qty 1

## 2016-07-08 NOTE — Progress Notes (Signed)
  Subjective:  Patient states she passed formed stool once and had multiple loose stools. Abdominal pain has now migrated to left upper quadrant. She denies melena or rectal bleeding. She continues to complain of intermittent heartburn and regurgitation as well as nausea. She denies dysphagia. She has sporadic vomiting.  Objective: Blood pressure 135/63, pulse 87, temperature 98.6 F (37 C), temperature source Oral, resp. rate 17, height '5\' 3"'$  (1.6 m), weight 241 lb (109.3 kg), SpO2 97 %. Patient is alert and in no acute distress. Abdomen is full. Bowel sounds are normal. On palpation is soft with mild tenderness at LUQ. No organomegaly or masses..  Labs/studies Results:   Recent Labs  07/08/16 0552  WBC 10.1  HGB 11.7*  HCT 36.8  PLT 305    BMET   Recent Labs  07/06/16 0433 07/08/16 0552  NA 136 134*  K 4.7 4.7  CL 98* 97*  CO2 24 23  GLUCOSE 240* 182*  BUN 46* 40*  CREATININE 10.07* 10.55*  CALCIUM 9.3 9.4    LFT   Recent Labs  07/06/16 0433 07/08/16 0552  ALBUMIN 3.7 3.9       Assessment:  #1. Migratory abdominal pain most likely secondary to constipation. She may also have an underlying IBS. She used to have postprandial bowel movement but now she is constipated. She has gone as many as 2 weeks without a bowel movement. She has had good results with polyethylene glycol yesterday. Will try her on  Linzess. #2. Gastroesophageal reflux disease. If symptoms are not controlled to her satisfaction she min benefit from EGD and solid-phase gastric emptying study. Gall bladder was removed several years ago.    Recommendations:  KUB in a.m. document resolution of constipation. Linzess or Linaclotide 145 mcg po qam.

## 2016-07-08 NOTE — Care Management Important Message (Signed)
Important Message  Patient Details  Name: Patricia [name] MRN: [id number] Date of Birth: July 21, 1971   Medicare Important Message Given:  Yes    Briant Sites, RN 07/08/2016, 5:07 PM

## 2016-07-08 NOTE — Progress Notes (Signed)
Subjective: Interval History: Patient presently feeling somewhat better. She is able to move her bowels. Patient still has some nausea and also abdominal pain. .  Objective: Vital signs in last 24 hours: Temp:  [97.4 F (36.3 C)-98.9 F (37.2 C)] 98.6 F (37 C) (07/23 0645) Pulse Rate:  [79-87] 87 (07/23 0645) Resp:  [17-20] 17 (07/23 0645) BP: (133-172)/(53-97) 135/63 (07/23 0645) SpO2:  [96 %-99 %] 97 % (07/23 0645) Weight change:   Intake/Output from previous day: 07/22 0701 - 07/23 0700 In: 720 [P.O.:720] Out: -  Intake/Output this shift: No intake/output data recorded.  General appearance: alert, cooperative and no distress Resp: clear to auscultation bilaterally Cardio: regular rate and rhythm, S1, S2 normal, no murmur, click, rub or gallop GI: soft, non-tender; bowel sounds normal; no masses,  no organomegaly Extremities: No edema  Lab Results:  Recent Labs  07/08/16 0552  WBC 10.1  HGB 11.7*  HCT 36.8  PLT 305   BMET:   Recent Labs  07/06/16 0433  NA 136  K 4.7  CL 98*  CO2 24  GLUCOSE 240*  BUN 46*  CREATININE 10.07*  CALCIUM 9.3   No results for input(s): PTH in the last 72 hours. Iron Studies: No results for input(s): IRON, TIBC, TRANSFERRIN, FERRITIN in the last 72 hours.  Studies/Results: Dg Abd 1 View  Result Date: 07/06/2016 CLINICAL DATA:  Patient with generalized abdominal pain and cramping for 1 month. EXAM: ABDOMEN - 1 VIEW COMPARISON:  MRI abdomen 11/23/2013 FINDINGS: Lung bases are clear. Cholecystectomy clips. Gas is demonstrated within nondilated loops of large and small bowel in a nonobstructed pattern. There is a large amount of stool within the cecum, ascending and transverse colon. Supine evaluation limited for the detection of free intraperitoneal air. Osseous skeleton is unremarkable. Vascular calcifications. IMPRESSION: Nonobstructed bowel gas pattern. Large amount of stool within the cecum, ascending and transverse colon,  compatible with constipation. Electronically Signed   By: Lovey Newcomer M.D.   On: 07/06/2016 19:16    I have reviewed the patient's current medications.  Assessment/Plan: Problem #1 abdominal pain: Possibly related to her constipation. Problem # end-stage renal disease: She is status post hemodialysis on Friday. Problem #3 anemia: Her hemoglobin is within our target goal Problem #4 history of hypertension: Her blood pressure is reasonably controlled. Problem #5 metabolic bone disease: Calcium is range ,but  her phosphorus is slightly high. Patient presently on Renvela. Problem #6 fluid management: Patient presently denies any difficulty breathing and she doesn't have any significant sign of fluid overload. Problem #7 history of constipation: Presently she is on GoLYTELY and seems to be improving. Plan:1] patient presently doesn't require any dialysis. 2] will check CBC and renal panel in the morning. 3] will make arrangement for patient to get dialysis tomorrow.   LOS: 2 days   Calub Tarnow S 07/08/2016,8:18 AM

## 2016-07-08 NOTE — Progress Notes (Signed)
PROGRESS NOTE    Patricia [name]  NUU:[id number] DOB: Sep 25, 1971 DOA: 07/04/2016 PCP: [name]    Brief Narrative:  45 y.o. female with medical history significant of hypertension, diabetes, end-stage renal disease on Monday Wednesday Friday hemodialysis presents to the emergency department with complaints of progressively worsening chest pain and nausea. Patient describes chest pain as being left-sided with radiation to the right flank. Symptoms improved with Tums. Did not seem to be positional. Given worsening symptoms, patient decided to present to the emergency department   Assessment & Plan:   Principal Problem:   Chest pain Active Problems:   ESRD on dialysis ([place])   Diabetes mellitus ([place])   Constipation   Nausea   Essential hypertension   Nausea with vomiting   Generalized abdominal pain   Chest pain -Troponin serially neg -Chest pains historically improved with Tums -Patient is status post a recent negative stress test -Doubt cardiogenic chest pain. Instead, consider possible reflux disease related to presenting constipation -Continue patient with scheduled PPI -2d echo without wall motion abnormality  End-stage renal disease -Nephrology consulted through the emergency department -Patient to continue Monday, Wednesday, Friday hemodialysis as scheduled  Diabetes mellitus -Will continue patient with supplemental sliding scale insulin  Abd pain with suspected Constipation -Patient acknowledges a [place]uz[place]-standing history of constipation. -Reports little to no results with suppositories and daily MiraLAX -some improvement with smog enema x2 -07/06/2016, patient noted to be in marked pain, hunched over -Follow-up abdominal x-ray confirmed large stool within the colon. Gastroenterology was consulted. Recommendation for possible Linzess -Patient was given GoLYTELY on 07/07/2016. Some results noted. Patient's pain initially was on the right quadrants, correlating  with ascending colon, currently pain in the left side which correlates with descending colon  Nausea -Likely secondary to above constipation -Patient is status post cholecystectomy  Hypertension -Blood pressure currently better controlled -Recently elevated BP possibly secondary to uncontrolled abdominal pain per above -Increased nifedipine to '90mg'$   DVT prophylaxis: Heparin subQ Code Status: Full Family Communication: Pt in room, family at bedside Disposition Plan: Possible d/c home within 24 hours   Consultants:   Nephrology  Procedures:     Antimicrobials:  Anti-infectives    None          Subjective: Somewhat better today. Patient still complains of pain, today's in the left side.  Objective: Vitals:   07/07/16 0923 07/07/16 1406 07/07/16 2321 07/08/16 0645  BP: (!) 133/97 (!) 151/53 (!) 172/73 135/63  Pulse: 87 80 79 87  Resp:  '19 20 17  '$ Temp: 98.9 F (37.2 C) 97.4 F (36.3 C) 98.4 F (36.9 C) 98.6 F (37 C)  TempSrc: Oral Oral Oral Oral  SpO2:  96% 99% 97%  Weight:      Height:        Intake/Output Summary (Last 24 hours) at 07/08/16 1129 Last data filed at 07/07/16 2100  Gross per 24 hour  Intake              720 ml  Output                0 ml  Net              720 ml   Filed Weights   07/04/16 0445 07/04/16 1340 07/05/16 0713  Weight: 106.6 kg (235 lb) 110.1 kg (242 lb 11.6 oz) 109.3 kg (241 lb)    Examination:  General exam: Appears uncomfortable, lying in bed Respiratory system: Clear to auscultation. Respiratory effort normal. Cardiovascular system: S1 &  S2 heard, RRR. Gastrointestinal system: Abdomen obese, tender, decreased BS. Central nervous system: Alert and oriented. No focal neurological deficits. Extremities: Symmetric 5 x 5 power. Skin: No rashes, lesions  Psychiatry: Judgement and insight appear normal. Mood & affect appropriate.     Data Reviewed: I have personally reviewed following labs and imaging  studies  CBC:  Recent Labs Lab 07/04/16 0531 07/08/16 0552  WBC 11.5* 10.1  NEUTROABS 8.9*  --   HGB 11.3* 11.7*  HCT 34.9* 36.8  MCV 91.8 94.1  PLT 303 003   Basic Metabolic Panel:  Recent Labs Lab 07/04/16 0531 07/06/16 0433 07/08/16 0552  NA 135 136 134*  K 5.2* 4.7 4.7  CL 101 98* 97*  CO2 '22 24 23  '$ GLUCOSE 257* 240* 182*  BUN 56* 46* 40*  CREATININE 10.64* 10.07* 10.55*  CALCIUM 10.0 9.3 9.4  PHOS  --  5.6* 5.1*   GFR: Estimated Creatinine Clearance: 7.9 mL/min (by C-G formula based on SCr of 10.55 mg/dL). Liver Function Tests:  Recent Labs Lab 07/06/16 0433 07/08/16 0552  ALBUMIN 3.7 3.9   No results for input(s): LIPASE, AMYLASE in the last 168 hours. No results for input(s): AMMONIA in the last 168 hours. Coagulation Profile: No results for input(s): INR, PROTIME in the last 168 hours. Cardiac Enzymes:  Recent Labs Lab 07/04/16 0531  TROPONINI 0.04*   BNP (last 3 results) No results for input(s): PROBNP in the last 8760 hours. HbA1C: No results for input(s): HGBA1C in the last 72 hours. CBG:  Recent Labs Lab 07/07/16 0806 07/07/16 1141 07/07/16 1623 07/07/16 2319 07/08/16 0746  GLUCAP 143* 141* 170* 99 164*   Lipid Profile: No results for input(s): CHOL, HDL, LDLCALC, TRIG, CHOLHDL, LDLDIRECT in the last 72 hours. Thyroid Function Tests: No results for input(s): TSH, T4TOTAL, FREET4, T3FREE, THYROIDAB in the last 72 hours. Anemia Panel: No results for input(s): VITAMINB12, FOLATE, FERRITIN, TIBC, IRON, RETICCTPCT in the last 72 hours. Sepsis Labs: No results for input(s): PROCALCITON, LATICACIDVEN in the last 168 hours.  Recent Results (from the past 240 hour(s))  MRSA PCR Screening     Status: None   Collection Time: 07/04/16 11:29 AM  Result Value Ref Range Status   MRSA by PCR NEGATIVE NEGATIVE Final    Comment:        The GeneXpert MRSA Assay (FDA approved for NASAL specimens only), is one component of a comprehensive  MRSA colonization surveillance program. It is not intended to diagnose MRSA infection nor to guide or monitor treatment for MRSA infections.          Radiology Studies: Dg Abd 1 View  Result Date: 07/06/2016 CLINICAL DATA:  Patient with generalized abdominal pain and cramping for 1 month. EXAM: ABDOMEN - 1 VIEW COMPARISON:  MRI abdomen 11/23/2013 FINDINGS: Lung bases are clear. Cholecystectomy clips. Gas is demonstrated within nondilated loops of large and small bowel in a nonobstructed pattern. There is a large amount of stool within the cecum, ascending and transverse colon. Supine evaluation limited for the detection of free intraperitoneal air. Osseous skeleton is unremarkable. Vascular calcifications. IMPRESSION: Nonobstructed bowel gas pattern. Large amount of stool within the cecum, ascending and transverse colon, compatible with constipation. Electronically Signed   By: Lovey Newcomer M.D.   On: 07/06/2016 19:16        Scheduled Meds: . aspirin  81 mg Oral Daily  . docusate sodium  100 mg Oral BID  . epoetin (EPOGEN/PROCRIT) injection  2,000 Units Intravenous Q M,W,F-HD  .  fluticasone  2 spray Each Nare Daily  . furosemide  40 mg Oral BID  . glipiZIDE  5 mg Oral BID AC  . heparin  5,000 Units Subcutaneous Q8H  . insulin aspart  0-9 Units Subcutaneous TID WC  . labetalol  300 mg Oral BID  . NIFEdipine  90 mg Oral Daily  . pantoprazole  40 mg Oral Daily  . rOPINIRole  2 mg Oral QHS  . sevelamer carbonate  2,400 mg Oral TID WC   Continuous Infusions:    LOS: 2 days    Jimy Gates, Orpah Melter, MD [facility] Pager [phone]  If 7PM-7AM, please contact night-coverage [email or web address] Password [facility] 07/08/2016, 11:29 AM

## 2016-07-09 ENCOUNTER — Inpatient Hospital Stay (HOSPITAL_COMMUNITY): Payer: PRIVATE HEALTH INSURANCE

## 2016-07-09 DIAGNOSIS — K5909 Other constipation: Secondary | ICD-10-CM

## 2016-07-09 LAB — GLUCOSE, CAPILLARY
GLUCOSE-CAPILLARY: 109 mg/dL — AB (ref 65–99)
GLUCOSE-CAPILLARY: 137 mg/dL — AB (ref 65–99)
Glucose-Capillary: 110 mg/dL — ABNORMAL HIGH (ref 65–99)
Glucose-Capillary: 104 mg/dL — ABNORMAL HIGH (ref 65–99)

## 2016-07-09 MED ORDER — SODIUM CHLORIDE 0.9 % IV SOLN: 100.0000 mL | INTRAVENOUS | Status: DC | PRN

## 2016-07-09 MED ORDER — HEPARIN SODIUM (PORCINE) 5000 UNIT/ML IJ SOLN
5000.0000 [IU] | Freq: Three times a day (TID) | INTRAMUSCULAR | Status: DC
  Administered 2016-07-09: 5000 [IU] via SUBCUTANEOUS
  Filled 2016-07-09: qty 1

## 2016-07-09 NOTE — Progress Notes (Signed)
    Subjective: Interval improvement in abdominal pain, 5/10 today. Has had some loose brown stools with Linzess and GoLytely. Denies hematochezia and melena. Persistent nausea this morning. No new GI complaints.  Objective: Vital signs in last 24 hours: Temp:  [98 F (36.7 C)-98.4 F (36.9 C)] 98 F (36.7 C) (07/24 0451) Pulse Rate:  [72-86] 72 (07/24 0451) Resp:  [18] 18 (07/24 0451) BP: (128-155)/(68-80) 128/68 (07/24 0451) SpO2:  [97 %] 97 % (07/24 0451) Last BM Date: 07/09/16 General:   Morbidly obese female, alert and oriented, pleasant. No distress. Eyes:  No icterus, sclera clear. Conjuctiva pink.  Heart:  S1, S2 present, no murmurs noted.  Lungs: Clear to auscultation bilaterally, without wheezing, rales, or rhonchi.  Abdomen:  Bowel sounds present, obese but soft, non-distended. Generalized TTP noted. No rebound or guarding. Msk:  Symmetrical without gross deformities. Pulses:  Normal pulses noted. Extremities:  Without clubbing or edema. Neurologic:  Alert and  oriented x4;  grossly normal neurologically. Psych:  Alert and cooperative. Normal mood and affect.  Intake/Output from previous day: 07/23 0701 - 07/24 0700 In: 720 [P.O.:720] Out: -  Intake/Output this shift: No intake/output data recorded.  Lab Results:  Recent Labs  07/08/16 0552  WBC 10.1  HGB 11.7*  HCT 36.8  PLT 305   BMET  Recent Labs  07/08/16 0552  NA 134*  K 4.7  CL 97*  CO2 23  GLUCOSE 182*  BUN 40*  CREATININE 10.55*  CALCIUM 9.4   LFT  Recent Labs  07/08/16 0552  ALBUMIN 3.9   PT/INR No results for input(s): LABPROT, INR in the last 72 hours. Hepatitis Panel No results for input(s): HEPBSAG, HCVAB, HEPAIGM, HEPBIGM in the last 72 hours.   Studies/Results: Dg Abd Portable 1v  Result Date: 07/09/2016 CLINICAL DATA:  Constipation. EXAM: PORTABLE ABDOMEN - 1 VIEW COMPARISON:  Radiographs of July 06, 2016. FINDINGS: The bowel gas pattern is normal. No radio-opaque  calculi or other significant radiographic abnormality are seen. Status post cholecystectomy. IMPRESSION: No evidence of bowel obstruction or ileus. Electronically Signed   By: Marijo Conception, M.D.   On: 07/09/2016 12:10   Assessment: 45 year old female with a history of hypertension, GERD, diabetes, ESRD on hemodialysis with persistent abdominal pain, N/V, constipation, GERD symptoms.   Abdominal pain improved with bowel movements over the past 2 days on GoLytely and Linzess. Residual pain possibly residual soreness from chronic constipation. No mention of significant stool burden on repeat KUB.   No vomiting today. Continued but improved nausea. Likely uncontrolled GERD and/or gastroparesis. Unlikely able to tolerate GES today with increased N/V. Seems to be tolerating a diet well.   Provided patient education about gastroparesis including etiology, dietary control, and Reglan as an option if needed (though not preferred).   EGD and GES likely as outpatient when acute presentation settles down.   Plan: 1. Continue Linzess 2. Continue pain and nausea management 3. Conservative narcotics due to underlying constipation 4. GES and possible EGD as outpatient 5. Continued bid PPI 6. Supportive measures.   Walden Field, AGNP-C Adult & Gerontological Nurse Practitioner [facility]     LOS: 3 days    07/09/2016, 12:52 PM

## 2016-07-09 NOTE — Progress Notes (Signed)
Subjective: Interval History: Patient states that she is feeling somewhat better. She had one episode of bowel movement yesterday and she didn't have any since then. Nausea seems to have improved his .  Objective: Vital signs in last 24 hours: Temp:  [98 F (36.7 C)-98.4 F (36.9 C)] 98 F (36.7 C) (07/24 0451) Pulse Rate:  [72-86] 72 (07/24 0451) Resp:  [18] 18 (07/24 0451) BP: (128-155)/(68-80) 128/68 (07/24 0451) SpO2:  [97 %] 97 % (07/24 0451) Weight change:   Intake/Output from previous day: 07/23 0701 - 07/24 0700 In: 720 [P.O.:720] Out: -  Intake/Output this shift: No intake/output data recorded.  General appearance: alert, cooperative and no distress Resp: clear to auscultation bilaterally Cardio: regular rate and rhythm, S1, S2 normal, no murmur, click, rub or gallop GI: soft, non-tender; bowel sounds normal; no masses,  no organomegaly Extremities: No edema  Lab Results:  Recent Labs  07/08/16 0552  WBC 10.1  HGB 11.7*  HCT 36.8  PLT 305   BMET:   Recent Labs  07/08/16 0552  NA 134*  K 4.7  CL 97*  CO2 23  GLUCOSE 182*  BUN 40*  CREATININE 10.55*  CALCIUM 9.4   No results for input(s): PTH in the last 72 hours. Iron Studies: No results for input(s): IRON, TIBC, TRANSFERRIN, FERRITIN in the last 72 hours.  Studies/Results: No results found.  I have reviewed the patient's current medications.  Assessment/Plan: Problem #1 abdominal pain:Possibly related to her constipation. Presently she is feeling somewhat better. Problem # end-stage renal disease: She is status post hemodialysis on Friday. Presently patient doesn't have any uremic signs and symptoms. Problem #3 anemia: Her hemoglobin is within our target goal Problem #4 history of hypertension: Her blood pressure seems to have improved and presently within reasonable range Problem #5 metabolic bone disease: Her calcium and phosphorus is range. Presently she is on a binder. Problem #6 fluid  management: Present status post dialysis yesterday. Patient denies any difficulty breathing. Problem #7 history of constipation. Improving. Presently being followed by GI. They're thinking in terms of possible irritable bowel syndrome. Plan:1] will continue his present management 2] will dialyze patient today and try to remove about 2 L and her blood pressure tolerates.   LOS: 3 days   Tracia Lacomb S 07/09/2016,8:13 AM   He is in a

## 2016-07-09 NOTE — Progress Notes (Signed)
PROGRESS NOTE    Patricia [name]  HAL:[id number] DOB: 05-Mar-1971 DOA: 07/04/2016 PCP: [name]    Brief Narrative:  45 y.o. female with medical history significant of hypertension, diabetes, end-stage renal disease on Monday Wednesday Friday hemodialysis presents to the emergency department with complaints of progressively worsening chest pain and nausea. Patient describes chest pain as being left-sided with radiation to the right flank. Symptoms improved with Tums. Did not seem to be positional. Given worsening symptoms, patient decided to present to the emergency department   Assessment & Plan:   Principal Problem:   Chest pain Active Problems:   ESRD on dialysis ([place])   Diabetes mellitus ([place])   Constipation   Nausea   Essential hypertension   Nausea with vomiting   Generalized abdominal pain   Chest pain -Troponin serially neg -Chest pains historically improved with Tums -Patient is status post a recent negative stress test -Doubt cardiogenic chest pain. Instead, consider possible reflux disease related to presenting constipation -Continue patient with scheduled PPI -2d echo without wall motion abnormality  End-stage renal disease -Nephrology consulted through the emergency department -Patient to continue Monday, Wednesday, Friday hemodialysis as scheduled  Diabetes mellitus -Will continue patient with supplemental sliding scale insulin  Abd pain with suspected Constipation -Patient acknowledges a Wigal-standing history of constipation. -Initially reported little to no results with suppositories and daily MiraLAX -some improvement with smog enema x2 -07/06/2016, patient noted to be in marked pain, hunched over -Follow-up abdominal x-ray confirmed large stool within the colon. Gastroenterology was consulted. Recommendation for Linzess -Patient was given GoLYTELY on 07/07/2016. Results noted. -Today, pt continues with abd discomfort, nausea -Plan for EGD in  AM by Dr. Oneida Alar  Nausea -Likely secondary to above constipation -Patient is status post cholecystectomy  Hypertension -Blood pressure currently better controlled -Recently elevated BP possibly secondary to uncontrolled abdominal pain per above -Increased nifedipine to '90mg'$   DVT prophylaxis: Heparin subQ Code Status: Full Family Communication: Pt in room, family at bedside Disposition Plan: Possible d/c home within 24 hours   Consultants:   Nephrology  Procedures:     Antimicrobials:  Anti-infectives    None          Subjective: Reports feeling better. Still nauseated  Objective: Vitals:   07/08/16 0645 07/08/16 2108 07/09/16 0451 07/09/16 1517  BP: 135/63 (!) 155/80 128/68 (!) 154/88  Pulse: 87 86 72 71  Resp: '17 18 18 18  '$ Temp: 98.6 F (37 C) 98.4 F (36.9 C) 98 F (36.7 C) 98 F (36.7 C)  TempSrc: Oral Oral Oral   SpO2: 97% 97% 97% 98%  Weight:      Height:        Intake/Output Summary (Last 24 hours) at 07/09/16 1611 Last data filed at 07/08/16 1700  Gross per 24 hour  Intake              240 ml  Output                0 ml  Net              240 ml   Filed Weights   07/04/16 0445 07/04/16 1340 07/05/16 0713  Weight: 106.6 kg (235 lb) 110.1 kg (242 lb 11.6 oz) 109.3 kg (241 lb)    Examination:  General exam: Appears uncomfortable, sitting up in bed Respiratory system: Clear to auscultation. Respiratory effort normal. Cardiovascular system: S1 & S2 heard, RRR. Gastrointestinal system: Abdomen obese, tender, decreased BS. Central nervous system: Alert and oriented.  No focal neurological deficits. Extremities: Symmetric 5 x 5 power. Skin: No rashes, lesions  Psychiatry: Judgement and insight appear normal. Mood & affect appropriate.     Data Reviewed: I have personally reviewed following labs and imaging studies  CBC:  Recent Labs Lab 07/04/16 0531 07/08/16 0552  WBC 11.5* 10.1  NEUTROABS 8.9*  --   HGB 11.3* 11.7*  HCT 34.9*  36.8  MCV 91.8 94.1  PLT 303 244   Basic Metabolic Panel:  Recent Labs Lab 07/04/16 0531 07/06/16 0433 07/08/16 0552  NA 135 136 134*  K 5.2* 4.7 4.7  CL 101 98* 97*  CO2 '22 24 23  '$ GLUCOSE 257* 240* 182*  BUN 56* 46* 40*  CREATININE 10.64* 10.07* 10.55*  CALCIUM 10.0 9.3 9.4  PHOS  --  5.6* 5.1*   GFR: Estimated Creatinine Clearance: 7.9 mL/min (by C-G formula based on SCr of 10.55 mg/dL). Liver Function Tests:  Recent Labs Lab 07/06/16 0433 07/08/16 0552  ALBUMIN 3.7 3.9   No results for input(s): LIPASE, AMYLASE in the last 168 hours. No results for input(s): AMMONIA in the last 168 hours. Coagulation Profile: No results for input(s): INR, PROTIME in the last 168 hours. Cardiac Enzymes:  Recent Labs Lab 07/04/16 0531  TROPONINI 0.04*   BNP (last 3 results) No results for input(s): PROBNP in the last 8760 hours. HbA1C: No results for input(s): HGBA1C in the last 72 hours. CBG:  Recent Labs Lab 07/08/16 1128 07/08/16 1639 07/08/16 2042 07/09/16 0816 07/09/16 1157  GLUCAP 90 103* 138* 109* 110*   Lipid Profile: No results for input(s): CHOL, HDL, LDLCALC, TRIG, CHOLHDL, LDLDIRECT in the last 72 hours. Thyroid Function Tests: No results for input(s): TSH, T4TOTAL, FREET4, T3FREE, THYROIDAB in the last 72 hours. Anemia Panel: No results for input(s): VITAMINB12, FOLATE, FERRITIN, TIBC, IRON, RETICCTPCT in the last 72 hours. Sepsis Labs: No results for input(s): PROCALCITON, LATICACIDVEN in the last 168 hours.  Recent Results (from the past 240 hour(s))  MRSA PCR Screening     Status: None   Collection Time: 07/04/16 11:29 AM  Result Value Ref Range Status   MRSA by PCR NEGATIVE NEGATIVE Final    Comment:        The GeneXpert MRSA Assay (FDA approved for NASAL specimens only), is one component of a comprehensive MRSA colonization surveillance program. It is not intended to diagnose MRSA infection nor to guide or monitor treatment for MRSA  infections.          Radiology Studies: Dg Abd Portable 1v  Result Date: 07/09/2016 CLINICAL DATA:  Constipation. EXAM: PORTABLE ABDOMEN - 1 VIEW COMPARISON:  Radiographs of July 06, 2016. FINDINGS: The bowel gas pattern is normal. No radio-opaque calculi or other significant radiographic abnormality are seen. Status post cholecystectomy. IMPRESSION: No evidence of bowel obstruction or ileus. Electronically Signed   By: Marijo Conception, M.D.   On: 07/09/2016 12:10       Scheduled Meds: . aspirin  81 mg Oral Daily  . docusate sodium  100 mg Oral BID  . fluticasone  2 spray Each Nare Daily  . furosemide  40 mg Oral BID  . glipiZIDE  5 mg Oral BID AC  . heparin  5,000 Units Subcutaneous Q8H  . insulin aspart  0-9 Units Subcutaneous TID WC  . labetalol  300 mg Oral BID  . linaclotide  145 mcg Oral QAC breakfast  . NIFEdipine  90 mg Oral Daily  . pantoprazole  40 mg Oral  Daily  . rOPINIRole  2 mg Oral QHS  . sevelamer carbonate  2,400 mg Oral TID WC   Continuous Infusions:    LOS: 3 days    CHIU, Orpah Melter, MD [facility] Pager [phone]  If 7PM-7AM, please contact night-coverage [email or web address] Password [email or web address] 07/09/2016, 4:11 PM

## 2016-07-09 NOTE — Procedures (Signed)
   HEMODIALYSIS TREATMENT NOTE:  4 hour heparin-free dialysis completed via left upper arm AVF (15g/antegrade). Goal met: 2.5 liters removed without interruption in ultrafiltration. All blood was returned and hemostasis was achieved within 15 minutes. Report called to Herma Carson, RN.  Rockwell Alexandria, RN, CDN

## 2016-07-10 ENCOUNTER — Encounter (HOSPITAL_COMMUNITY)
Admission: EM | Disposition: A | Discharge status: Home / Self Care | Payer: Self-pay | Source: Home / Self Care | Attending: Internal Medicine

## 2016-07-10 ENCOUNTER — Encounter (HOSPITAL_COMMUNITY): Payer: Self-pay

## 2016-07-10 DIAGNOSIS — R1032 Left lower quadrant pain: Secondary | ICD-10-CM

## 2016-07-10 DIAGNOSIS — R1011 Right upper quadrant pain: Secondary | ICD-10-CM

## 2016-07-10 DIAGNOSIS — R112 Nausea with vomiting, unspecified: Secondary | ICD-10-CM

## 2016-07-10 DIAGNOSIS — K2970 Gastritis, unspecified, without bleeding: Secondary | ICD-10-CM

## 2016-07-10 DIAGNOSIS — K222 Esophageal obstruction: Secondary | ICD-10-CM

## 2016-07-10 HISTORY — PX: BALLOON DILATION: SHX5330

## 2016-07-10 HISTORY — PX: ESOPHAGOGASTRODUODENOSCOPY: SHX5428

## 2016-07-10 LAB — GLUCOSE, CAPILLARY
GLUCOSE-CAPILLARY: 176 mg/dL — AB (ref 65–99)
GLUCOSE-CAPILLARY: 143 mg/dL — AB (ref 65–99)
GLUCOSE-CAPILLARY: 158 mg/dL — AB (ref 65–99)
Glucose-Capillary: 137 mg/dL — ABNORMAL HIGH (ref 65–99)

## 2016-07-10 SURGERY — EGD (ESOPHAGOGASTRODUODENOSCOPY)
Anesthesia: Moderate Sedation

## 2016-07-10 MED ORDER — SODIUM CHLORIDE 0.9 % IV SOLN: INTRAVENOUS | Status: DC

## 2016-07-10 MED ORDER — MIDAZOLAM HCL 5 MG/5ML IJ SOLN
INTRAMUSCULAR | Status: DC | PRN
  Administered 2016-07-10: 2 mg via INTRAVENOUS
  Administered 2016-07-10: 1 mg via INTRAVENOUS
  Administered 2016-07-10 (×2): 2 mg via INTRAVENOUS
  Administered 2016-07-10: 1 mg via INTRAVENOUS

## 2016-07-10 MED ORDER — FENTANYL CITRATE (PF) 100 MCG/2ML IJ SOLN
INTRAMUSCULAR | Status: AC
  Filled 2016-07-10: qty 2

## 2016-07-10 MED ORDER — STERILE WATER FOR IRRIGATION IR SOLN
Status: DC | PRN
  Administered 2016-07-10: 15:00:00

## 2016-07-10 MED ORDER — MIDAZOLAM HCL 5 MG/5ML IJ SOLN
INTRAMUSCULAR | Status: AC
  Filled 2016-07-10: qty 10

## 2016-07-10 MED ORDER — FENTANYL CITRATE (PF) 100 MCG/2ML IJ SOLN
INTRAMUSCULAR | Status: DC | PRN
  Administered 2016-07-10 (×5): 25 ug via INTRAVENOUS

## 2016-07-10 MED ORDER — LIDOCAINE VISCOUS 2 % MT SOLN
OROMUCOSAL | Status: DC | PRN
  Administered 2016-07-10: 1 via OROMUCOSAL

## 2016-07-10 MED ORDER — LIDOCAINE VISCOUS 2 % MT SOLN
OROMUCOSAL | Status: AC
  Filled 2016-07-10: qty 15

## 2016-07-10 MED ORDER — ONDANSETRON HCL 4 MG/2ML IJ SOLN
4.0000 mg | Freq: Three times a day (TID) | INTRAMUSCULAR | Status: DC
  Administered 2016-07-10 – 2016-07-11 (×5): 4 mg via INTRAVENOUS
  Filled 2016-07-10 (×5): qty 2

## 2016-07-10 MED ORDER — MEPERIDINE HCL 100 MG/ML IJ SOLN
INTRAMUSCULAR | Status: AC
  Filled 2016-07-10: qty 2

## 2016-07-10 MED ORDER — PANTOPRAZOLE SODIUM 40 MG PO TBEC
40.0000 mg | DELAYED_RELEASE_TABLET | Freq: Two times a day (BID) | ORAL | Status: DC
  Administered 2016-07-10 – 2016-07-11 (×3): 40 mg via ORAL
  Filled 2016-07-10 (×3): qty 1

## 2016-07-10 NOTE — Op Note (Signed)
[facility] Patient Name: Patricia [name] Procedure Date: 07/10/2016 3:02 PM MRN: [id number] Date of Birth: [birth year] Attending MD: Barney Drain , MD CSN: [id number] Age: 45 Admit Type: Inpatient Procedure:                Upper GI endoscopy WITH COLD FORCEPS BIOPSY/PYLORIC                            CHANNEL DILATION Indications:              Abdominal pain in the right upper quadrant,                            Abdominal pain in the left upper quadrant, Nausea                            with vomiting Providers:                Barney Drain, MD, Renda Rolls, RN, Randa Spike,                            Technician, Bonnetta Barry, Technician Referring MD:              Medicines:                Fentanyl 125 micrograms IV, Midazolam 8 mg IV Complications:            No immediate complications. Estimated Blood Loss:     Estimated blood loss was minimal. Procedure:                Pre-Anesthesia Assessment:                           - Prior to the procedure, a History and Physical                            was performed, and patient medications and                            allergies were reviewed. The patient's tolerance of                            previous anesthesia was also reviewed. The risks                            and benefits of the procedure and the sedation                            options and risks were discussed with the patient.                            All questions were answered, and informed consent                            was obtained. Prior Anticoagulants: The patient has  taken aspirin, last dose was day of procedure. ASA                            Grade Assessment: III - A patient with severe                            systemic disease. After reviewing the risks and                            benefits, the patient was deemed in satisfactory                            condition to undergo the procedure.  After obtaining informed consent, the endoscope was                            passed under direct vision. Throughout the                            procedure, the patient's blood pressure, pulse, and                            oxygen saturations were monitored continuously. The                            Endoscope was introduced through the mouth, and                            advanced to the second part of duodenum. The upper                            GI endoscopy was technically difficult and complex                            due to the patient's agitation. Successful                            completion of the procedure was aided by increasing                            the dose of sedation medication. The patient                            tolerated the procedure fairly well. Scope In: 3:32:21 PM Scope Out: 3:54:22 PM Total Procedure Duration: 0 hours 22 minutes 1 second  Findings:      The examined esophagus was normal.      MET SIGNIFICANT RESISTANCE ATTEMPTING TO PASS SCOPE THROUGH PYLORUS. A       benign-appearing, intrinsic moderate stenosis was found at the pylorus.       This was traversed. A TTS dilator was passed through the scope. Dilation       with a 12-13.5-15 mm pyloric balloon dilator was performed.      Scattered mild inflammation was found in the stomach. Biopsies were  taken with a cold forceps for Helicobacter pylori testing.      The examined duodenum was normal. Biopsies for histology were taken with       a cold forceps for evaluation of celiac disease OR EOSINOPHILIC       GASTROENTERITIS. Impression:               - Normal esophagus.                           - Gastric stenosis was found at the pylorus.                            Dilated.                           - Gastritis. Biopsied.                           - Normal examined duodenum. Biopsied. Moderate Sedation:      Moderate (conscious) sedation was administered by the endoscopy nurse        and supervised by the endoscopist. The following parameters were       monitored: oxygen saturation, heart rate, blood pressure, and response       to care. Total physician intraservice time was 41 minutes. Recommendation:           - Diabetic (ADA) diet.                           - Continue present medications.                           - Await pathology results.                           - Return patient to hospital ward for ongoing care.                           - PROTONIX BID AND ZOFRAN ATC                           - NO HEPARIN WITH DIALYSIS UNTIL JUL 28                           - Patient has a contact number available for                            emergencies. The signs and symptoms of potential                            delayed complications were discussed with the                            patient. Return to normal activities tomorrow.                            Written discharge instructions were provided to the  patient. Procedure Code(s):        --- Professional ---                           [phone], Esophagogastroduodenoscopy, flexible,                            transoral; with dilation of gastric/duodenal                            stricture(s) (eg, balloon, bougie)                           43239, Esophagogastroduodenoscopy, flexible,                            transoral; with biopsy, single or multiple                           99152, Moderate sedation services provided by the                            same physician or other qualified health care                            professional performing the diagnostic or                            therapeutic service that the sedation supports,                            requiring the presence of an independent trained                            observer to assist in the monitoring of the                            patient's level of consciousness and physiological                            status;  initial 15 minutes of intraservice time,                            patient age 21 years or older                           [phone], Moderate sedation services; each additional                            15 minutes intraservice time                           99153, Moderate sedation services; each additional                            15 minutes intraservice time Diagnosis Code(s):        ---  Professional ---                           K31.1, Adult hypertrophic pyloric stenosis                           K29.70, Gastritis, unspecified, without bleeding                           R10.11, Right upper quadrant pain                           R10.12, Left upper quadrant pain                           R11.2, Nausea with vomiting, unspecified CPT copyright 2016 American Medical Association. All rights reserved. The codes documented in this report are preliminary and upon coder review may  be revised to meet current compliance requirements. Barney Drain, MD Barney Drain, MD 07/10/2016 10:48:15 PM This report has been signed electronically. Number of Addenda: 0

## 2016-07-10 NOTE — H&P (Signed)
Primary Care Physician:  [name] Primary Gastroenterologist:  Dr. Oneida Alar  Pre-Procedure History & Physical: HPI:  Patricia [name] is a 45 y.o. female here for ABDOMINAL PAIN/vomiting.  Past Medical History:  Diagnosis Date  . Blood transfusion without reported diagnosis   . Cancer ([place])   . Diabetes mellitus without complication ([place])   . Dialysis patient ([place])    mon, wed, friday,   . Hypertension   . Renal disorder   . Renal insufficiency     Past Surgical History:  Procedure Laterality Date  . ABDOMINAL HYSTERECTOMY    . AV FISTULA PLACEMENT  11/2014   at [place]    . CHOLECYSTECTOMY    . DILATION AND CURETTAGE OF UTERUS      Prior to Admission medications   Medication Sig Start Date End Date Taking? Authorizing Provider  aspirin 81 MG tablet Take 81 mg by mouth daily.   Yes Historical Provider, MD  furosemide (LASIX) 40 MG tablet Take 40-80 mg by mouth 2 (two) times daily.    Yes Historical Provider, MD  glipiZIDE (GLUCOTROL) 5 MG tablet Take 5 mg by mouth 2 (two) times daily before a meal.   Yes Historical Provider, MD  labetalol (NORMODYNE) 300 MG tablet Take 1 tablet (300 mg total) by mouth 2 (two) times daily. 12/06/15  Yes Erline Hau, MD  NIFEdipine (PROCARDIA-XL/ADALAT CC) 60 MG 24 hr tablet Take 1 tablet (60 mg total) by mouth daily. 12/06/15  Yes Erline Hau, MD  rOPINIRole (REQUIP) 2 MG tablet Take 2 mg by mouth at bedtime.   Yes Historical Provider, MD  sevelamer carbonate (RENVELA) 800 MG tablet Take 3,200 mg by mouth 3 (three) times daily with meals. & 800 mg with snacks   Yes Historical Provider, MD  B Complex-C-Folic Acid (DIALYVITE 601) 0.8 MG TABS Take 1 tablet by mouth daily. 12/02/15   Historical Provider, MD    Allergies as of 07/04/2016 - Review Complete 07/04/2016  Allergen Reaction Noted  . Reglan [metoclopramide] Other (See Comments) 12/04/2015  . Amlodipine besylate Rash and  Other (See Comments) 12/04/2015    History reviewed. No pertinent family history.  Social History   Social History  . Marital status: Married    Spouse name: N/A  . Number of children: N/A  . Years of education: N/A   Occupational History  . Not on file.   Social History Main Topics  . Smoking status: Never Smoker  . Smokeless tobacco: Never Used  . Alcohol use No  . Drug use: No  . Sexual activity: Not on file   Other Topics Concern  . Not on file   Social History Narrative  . No narrative on file    Review of Systems: See HPI, otherwise negative ROS   Physical Exam: BP (!) 169/87   Pulse 67   Temp 98.4 F (36.9 C) (Oral)   Resp 14   Ht '5\' 3"'$  (1.6 m)   Wt 234 lb 8 oz (106.4 kg)   SpO2 100%   BMI 41.54 kg/m  General:   Alert,  pleasant and cooperative in NAD Head:  Normocephalic and atraumatic. Neck:  Supple; Lungs:  Clear throughout to auscultation.    Heart:  Regular rate and rhythm. Abdomen:  Soft, nontender and nondistended. Normal bowel sounds, without guarding, and without rebound.   Neurologic:  Alert and  oriented x4;  grossly normal neurologically.  Impression/Plan:     ABDOMINAL PAIN/vomiting  PLAN:  EGD TODAY

## 2016-07-10 NOTE — Progress Notes (Signed)
Subjective: Interval History: Abdominal pain is better. She didn't have any nausea or vomiting. She states that she has moved her bowels one time yesterday. .  Objective: Vital signs in last 24 hours: Temp:  [98 F (36.7 C)-98.4 F (36.9 C)] 98.4 F (36.9 C) (07/25 0558) Pulse Rate:  [71-80] 80 (07/25 0558) Resp:  [16-20] 20 (07/25 0558) BP: (140-202)/(75-107) 158/75 (07/25 0558) SpO2:  [95 %-99 %] 98 % (07/25 0558) Weight:  [106.4 kg (234 lb 8 oz)-110.1 kg (242 lb 11.6 oz)] 106.4 kg (234 lb 8 oz) (07/25 0558) Weight change:   Intake/Output from previous day: 07/24 0701 - 07/25 0700 In: 720 [P.O.:720] Out: 2563  Intake/Output this shift: No intake/output data recorded.  General appearance: alert, cooperative and no distress Resp: clear to auscultation bilaterally Cardio: regular rate and rhythm, S1, S2 normal, no murmur, click, rub or gallop GI: soft, non-tender; bowel sounds normal; no masses,  no organomegaly Extremities: No edema  Lab Results:  Recent Labs  07/08/16 0552  WBC 10.1  HGB 11.7*  HCT 36.8  PLT 305   BMET:   Recent Labs  07/08/16 0552  NA 134*  K 4.7  CL 97*  CO2 23  GLUCOSE 182*  BUN 40*  CREATININE 10.55*  CALCIUM 9.4   No results for input(s): PTH in the last 72 hours. Iron Studies: No results for input(s): IRON, TIBC, TRANSFERRIN, FERRITIN in the last 72 hours.  Studies/Results: Dg Abd Portable 1v  Result Date: 07/09/2016 CLINICAL DATA:  Constipation. EXAM: PORTABLE ABDOMEN - 1 VIEW COMPARISON:  Radiographs of July 06, 2016. FINDINGS: The bowel gas pattern is normal. No radio-opaque calculi or other significant radiographic abnormality are seen. Status post cholecystectomy. IMPRESSION: No evidence of bowel obstruction or ileus. Electronically Signed   By: Marijo Conception, M.D.   On: 07/09/2016 12:10   I have reviewed the patient's current medications.  Assessment/Plan: Problem #1 abdominal pain:Possibly related to her constipation.  Presently she seems to be feeling much better. Problem # end-stage renal disease: She is status post hemodialysis yesterday. Presently she is asymptomatic. Problem #3 anemia: Her hemoglobin is within our target goal Problem #4 history of hypertension: Her blood pressure is reasonably controlled Problem #5 metabolic bone disease: Her calcium and phosphorus is range. Presently she is on a binder. Problem #6 fluid management: Present status post dialysis yesterday. Patient denies any difficulty breathing. Problem #7 history of constipation. Improving. Presently being followed by GI. They're thinking in terms of possible irritable bowel syndrome. Patient for GED today Plan:1] will continue his present management 2] patient doesn't need dialysis today 3] will make arrangements for patient to get dialysis tomorrow 4] will check CBC and renal panel in the morning   LOS: 4 days   Annaston Upham S 07/10/2016,8:36 AM

## 2016-07-10 NOTE — Progress Notes (Signed)
PROGRESS NOTE    Patricia [name]  HYQ:[id number] DOB: February 22, 1971 DOA: 07/04/2016 PCP: [name]    Brief Narrative:  45 y.o. female with medical history significant of hypertension, diabetes, end-stage renal disease on Monday Wednesday Friday hemodialysis presents to the emergency department with complaints of progressively worsening chest pain and nausea. Patient describes chest pain as being left-sided with radiation to the right flank. Symptoms improved with Tums. Did not seem to be positional. Given worsening symptoms, patient decided to present to the emergency department   Assessment & Plan:   Principal Problem:   Chest pain, rule out acute myocardial infarction Active Problems:   ESRD on dialysis ([place])   Diabetes mellitus ([place])   Constipation   Nausea   Essential hypertension   Nausea with vomiting   Generalized abdominal pain   Chest pain -Troponin serially neg -Chest pains historically improved with Tums -Patient is status post a recent negative stress test -Doubt cardiogenic chest pain. Instead, consider possible reflux disease related to presenting constipation -Continue patient with scheduled PPI -2d echo without wall motion abnormality  End-stage renal disease -Nephrology consulted through the emergency department -Patient to continue Monday, Wednesday, Friday hemodialysis as scheduled  Diabetes mellitus -Will continue patient with supplemental sliding scale insulin  Abd pain with suspected Constipation -Patient acknowledges a Wiater-standing history of constipation. -Initially reported little to no results with suppositories and daily MiraLAX -some improvement with smog enema x2 -07/06/2016, patient noted to be in marked pain, hunched over -Follow-up abdominal x-ray confirmed large stool within the colon. Gastroenterology was consulted. Recommendation for Linzess -Patient was given GoLYTELY on 07/07/2016. Results noted. -Patient continues with abd  discomfort and some nausea -Patient for EGD today per Dr. Oneida Alar  Nausea -Likely secondary to above constipation -Patient is status post cholecystectomy  Hypertension -Blood pressure currently better controlled -Recently elevated BP possibly secondary to uncontrolled abdominal pain per above -Increased nifedipine to '90mg'$   DVT prophylaxis: Heparin subQ Code Status: Full Family Communication: Pt in room, family at bedside Disposition Plan: Possible d/c home within 24 hours   Consultants:   Nephrology  Procedures:     Antimicrobials:  Anti-infectives    None          Subjective: Feels better overall, however still has abdominal pain and some nausea.  Objective: Vitals:   07/10/16 1540 07/10/16 1545 07/10/16 1550 07/10/16 1555  BP: (!) 193/98 (!) 186/74 (!) 154/69   Pulse: 81 77 77 73  Resp: (!) 35 17 19 (!) 22  Temp:      TempSrc:      SpO2: 96% 98% 100% 97%  Weight:      Height:        Intake/Output Summary (Last 24 hours) at 07/10/16 1616 Last data filed at 07/10/16 1300  Gross per 24 hour  Intake              240 ml  Output             2563 ml  Net            -2323 ml   Filed Weights   07/05/16 0713 07/09/16 1800 07/10/16 0558  Weight: 109.3 kg (241 lb) 110.1 kg (242 lb 11.6 oz) 106.4 kg (234 lb 8 oz)    Examination:  General exam: Appears uncomfortable, Lying in bed Respiratory system: Clear to auscultation. Respiratory effort normal. Cardiovascular system: S1 & S2 heard, RRR. Gastrointestinal system: Abdomen obese, tender, decreased BS. Central nervous system: Alert and oriented. No focal  neurological deficits. Extremities: Symmetric 5 x 5 power. Skin: No rashes, lesions  Psychiatry: Judgement and insight appear normal. Mood & affect appropriate.     Data Reviewed: I have personally reviewed following labs and imaging studies  CBC:  Recent Labs Lab 07/04/16 0531 07/08/16 0552  WBC 11.5* 10.1  NEUTROABS 8.9*  --   HGB 11.3* 11.7*    HCT 34.9* 36.8  MCV 91.8 94.1  PLT 303 144   Basic Metabolic Panel:  Recent Labs Lab 07/04/16 0531 07/06/16 0433 07/08/16 0552  NA 135 136 134*  K 5.2* 4.7 4.7  CL 101 98* 97*  CO2 '22 24 23  '$ GLUCOSE 257* 240* 182*  BUN 56* 46* 40*  CREATININE 10.64* 10.07* 10.55*  CALCIUM 10.0 9.3 9.4  PHOS  --  5.6* 5.1*   GFR: Estimated Creatinine Clearance: 7.8 mL/min (by C-G formula based on SCr of 10.55 mg/dL). Liver Function Tests:  Recent Labs Lab 07/06/16 0433 07/08/16 0552  ALBUMIN 3.7 3.9   No results for input(s): LIPASE, AMYLASE in the last 168 hours. No results for input(s): AMMONIA in the last 168 hours. Coagulation Profile: No results for input(s): INR, PROTIME in the last 168 hours. Cardiac Enzymes:  Recent Labs Lab 07/04/16 0531  TROPONINI 0.04*   BNP (last 3 results) No results for input(s): PROBNP in the last 8760 hours. HbA1C: No results for input(s): HGBA1C in the last 72 hours. CBG:  Recent Labs Lab 07/09/16 1157 07/09/16 1620 07/09/16 2315 07/10/16 0756 07/10/16 1115  GLUCAP 110* 137* 104* 176* 143*   Lipid Profile: No results for input(s): CHOL, HDL, LDLCALC, TRIG, CHOLHDL, LDLDIRECT in the last 72 hours. Thyroid Function Tests: No results for input(s): TSH, T4TOTAL, FREET4, T3FREE, THYROIDAB in the last 72 hours. Anemia Panel: No results for input(s): VITAMINB12, FOLATE, FERRITIN, TIBC, IRON, RETICCTPCT in the last 72 hours. Sepsis Labs: No results for input(s): PROCALCITON, LATICACIDVEN in the last 168 hours.  Recent Results (from the past 240 hour(s))  MRSA PCR Screening     Status: None   Collection Time: 07/04/16 11:29 AM  Result Value Ref Range Status   MRSA by PCR NEGATIVE NEGATIVE Final    Comment:        The GeneXpert MRSA Assay (FDA approved for NASAL specimens only), is one component of a comprehensive MRSA colonization surveillance program. It is not intended to diagnose MRSA infection nor to guide or monitor  treatment for MRSA infections.          Radiology Studies: Dg Abd Portable 1v  Result Date: 07/09/2016 CLINICAL DATA:  Constipation. EXAM: PORTABLE ABDOMEN - 1 VIEW COMPARISON:  Radiographs of July 06, 2016. FINDINGS: The bowel gas pattern is normal. No radio-opaque calculi or other significant radiographic abnormality are seen. Status post cholecystectomy. IMPRESSION: No evidence of bowel obstruction or ileus. Electronically Signed   By: Marijo Conception, M.D.   On: 07/09/2016 12:10       Scheduled Meds: . [MAR Hold] aspirin  81 mg Oral Daily  . [MAR Hold] docusate sodium  100 mg Oral BID  . fentaNYL      . fentaNYL      . [MAR Hold] fluticasone  2 spray Each Nare Daily  . [MAR Hold] furosemide  40 mg Oral BID  . [MAR Hold] glipiZIDE  5 mg Oral BID AC  . [MAR Hold] insulin aspart  0-9 Units Subcutaneous TID WC  . [MAR Hold] labetalol  300 mg Oral BID  . lidocaine      . [  MAR Hold] linaclotide  145 mcg Oral QAC breakfast  . midazolam      . [MAR Hold] NIFEdipine  90 mg Oral Daily  . [MAR Hold] pantoprazole  40 mg Oral Daily  . [MAR Hold] rOPINIRole  2 mg Oral QHS  . [MAR Hold] sevelamer carbonate  2,400 mg Oral TID WC   Continuous Infusions: . sodium chloride       LOS: 4 days    Rice Walsh, Orpah Melter, MD [facility] Pager [phone]  If 7PM-7AM, please contact night-coverage [email or web address] Password [email or web address] 07/10/2016, 4:16 PM

## 2016-07-11 ENCOUNTER — Telehealth: Payer: Self-pay | Admitting: Gastroenterology

## 2016-07-11 DIAGNOSIS — K311 Adult hypertrophic pyloric stenosis: Secondary | ICD-10-CM

## 2016-07-11 LAB — RENAL FUNCTION PANEL
Albumin: 3.7 g/dL (ref 3.5–5.0)
Anion gap: 11 (ref 5–15)
BUN: 50 mg/dL — ABNORMAL HIGH (ref 6–20)
CHLORIDE: 99 mmol/L — AB (ref 101–111)
CO2: 27 mmol/L (ref 22–32)
CREATININE: 11.21 mg/dL — AB (ref 0.44–1.00)
Calcium: 9.4 mg/dL (ref 8.9–10.3)
GFR calc non Af Amer: 4 mL/min — ABNORMAL LOW (ref >60)
GFR, EST AFRICAN AMERICAN: 4 mL/min — AB (ref >60)
GLUCOSE: 154 mg/dL — AB (ref 65–99)
Phosphorus: 8 mg/dL — ABNORMAL HIGH (ref 2.5–4.6)
Potassium: 5.9 mmol/L — ABNORMAL HIGH (ref 3.5–5.1)
Sodium: 137 mmol/L (ref 135–145)

## 2016-07-11 LAB — CBC
HCT: 33.2 % — ABNORMAL LOW (ref 36.0–46.0)
Hemoglobin: 10.6 g/dL — ABNORMAL LOW (ref 12.0–15.0)
MCH: 29.9 pg (ref 26.0–34.0)
MCHC: 31.9 g/dL (ref 30.0–36.0)
MCV: 93.8 fL (ref 78.0–100.0)
PLATELETS: 268 10*3/uL (ref 150–400)
RBC: 3.54 MIL/uL — AB (ref 3.87–5.11)
RDW: 14.7 % (ref 11.5–15.5)
WBC: 7.4 10*3/uL (ref 4.0–10.5)

## 2016-07-11 LAB — GLUCOSE, CAPILLARY
GLUCOSE-CAPILLARY: 171 mg/dL — AB (ref 65–99)
Glucose-Capillary: 211 mg/dL — ABNORMAL HIGH (ref 65–99)
Glucose-Capillary: 112 mg/dL — ABNORMAL HIGH (ref 65–99)

## 2016-07-11 MED ORDER — LIDOCAINE VISCOUS 2 % MT SOLN: OROMUCOSAL | 5 refills | Status: DC

## 2016-07-11 MED ORDER — HEPARIN SODIUM (PORCINE) 1000 UNIT/ML DIALYSIS
20.0000 [IU]/kg | INTRAMUSCULAR | Status: DC | PRN
  Filled 2016-07-11: qty 3

## 2016-07-11 MED ORDER — LINACLOTIDE 145 MCG PO CAPS: 290.0000 ug | ORAL_CAPSULE | Freq: Every day | ORAL | Status: DC

## 2016-07-11 MED ORDER — MENTHOL 3 MG MT LOZG
1.0000 | LOZENGE | OROMUCOSAL | Status: DC | PRN
  Filled 2016-07-11: qty 9

## 2016-07-11 MED ORDER — LINACLOTIDE 290 MCG PO CAPS: 290.0000 ug | ORAL_CAPSULE | Freq: Every day | ORAL | 2 refills | Status: DC

## 2016-07-11 MED ORDER — SODIUM CHLORIDE 0.9 % IV SOLN: 100.0000 mL | INTRAVENOUS | Status: DC | PRN

## 2016-07-11 NOTE — Progress Notes (Addendum)
Patricia [name]  MRN: [id number]  DOB/AGE: 22-Feb-1971 45 y.o.  Primary Care Physician:JACKSON,SAMANTHA, PA-C  Admit date: 07/04/2016  Chief Complaint:  Chief Complaint  Patient presents with  . Shortness of Breath    S-Pt presented on  07/04/2016 with  Chief Complaint  Patient presents with  . Shortness of Breath  .    Pt today feels better  Meds . aspirin  81 mg Oral Daily  . docusate sodium  100 mg Oral BID  . fluticasone  2 spray Each Nare Daily  . furosemide  40 mg Oral BID  . glipiZIDE  5 mg Oral BID AC  . insulin aspart  0-9 Units Subcutaneous TID WC  . labetalol  300 mg Oral BID  . linaclotide  145 mcg Oral QAC breakfast  . NIFEdipine  90 mg Oral Daily  . ondansetron (ZOFRAN) IV  4 mg Intravenous TID AC & HS  . pantoprazole  40 mg Oral BID AC  . rOPINIRole  2 mg Oral QHS  . sevelamer carbonate  2,400 mg Oral TID WC     Physical Exam: Vital signs in last 24 hours: Temp:  [98 F (36.7 C)-98.4 F (36.9 C)] 98 F (36.7 C) (07/25 2350) Pulse Rate:  [67-82] 82 (07/25 2350) Resp:  [12-35] 20 (07/25 2350) BP: (125-193)/(54-98) 125/54 (07/25 2350) SpO2:  [94 %-100 %] 95 % (07/25 2350) Weight:  [234 lb 8 oz (106.4 kg)] 234 lb 8 oz (106.4 kg) (07/25 0558) Weight change:  Last BM Date: 07/10/16  Intake/Output from previous day: 07/25 0701 - 07/26 0700 In: 240 [P.O.:240] Out: -  No intake/output data recorded.   Physical Exam: General- pt is awake,alert, oriented to time place and person Resp- No acute REsp distress, CTA B/L NO Rhonchi CVS- S1S2 regular in rate and rhythm GIT- BS+, soft, NT, ND EXT- NO LE Edema, Cyanosis   Lab Results: CBC  Recent Labs  07/08/16 0552  WBC 10.1  HGB 11.7*  HCT 36.8  PLT 305    BMET  Recent Labs  07/08/16 0552  NA 134*  K 4.7  CL 97*  CO2 23  GLUCOSE 182*  BUN 40*  CREATININE 10.55*  CALCIUM 9.4    MICRO Recent Results (from the past 240 hour(s))  MRSA PCR Screening     Status: None   Collection  Time: 07/04/16 11:29 AM  Result Value Ref Range Status   MRSA by PCR NEGATIVE NEGATIVE Final    Comment:        The GeneXpert MRSA Assay (FDA approved for NASAL specimens only), is one component of a comprehensive MRSA colonization surveillance program. It is not intended to diagnose MRSA infection nor to guide or monitor treatment for MRSA infections.       Lab Results  Component Value Date   CALCIUM 9.4 07/08/2016   CAION 1.28 (H) 09/27/2012   PHOS 5.1 (H) 07/08/2016               Impression: 1)Renal  ESRD on HD                Pt is on Mon/Wed/Fri schedule                Pt will be dialyzed today  2)HTN   BP not at goal               Hd should help  Medication-  On Diuretics-Lasix On Calcium Channel Blockers On Alpha and beta Blockers    3)Anemia  IN ESRD the goal for  HGb 9--11 HGb is at goal NO need of epo   4)CKD Mineral-Bone Disorder  Phosphorus at goal. Calcium is  at goal.  5)GI- admitted with constipation GI and Primary MD following S/p EGD  NO heparin during hd till 7/28 as pt had biopsies taken  6)Electrolytes Hyperkalemic    Sec to ESRD    Now better  Hyponatremic   Sec to ESRD  7)Acid base Co2 at goal      Plan:  Will dialyze today Will use 2 k bath   Addendum Pt seen on HD Pt tolerating tx well.  BHUTANI,MANPREET S 07/11/2016, 5:15 AM

## 2016-07-11 NOTE — Discharge Summary (Addendum)
Physician Discharge Summary  Patricia [name] WPY:[id number] DOB: 1971/06/10 DOA: 07/04/2016  PCP: Patricia [name]  Admit date: 07/04/2016 Discharge date: 07/11/2016  Time spent: 45 minutes  Recommendations for Outpatient Follow-up:  We'll be discharge home today Dr. Buford [name] to arrange follow-up in 3 months   Discharge Diagnoses:  Principal Problem:   Chest pain, rule out acute myocardial infarction Active Problems:   ESRD on dialysis ([place])   Diabetes mellitus ([place])   Constipation   Nausea   Essential hypertension   Nausea with vomiting   Generalized abdominal pain   Pyloric stenosis, acquired   Discharge Condition: Stable and improved  Filed Weights   07/09/16 1800 07/10/16 0558 07/11/16 1205  Weight: 110.1 kg (242 lb 11.6 oz) 106.4 kg (234 lb 8 oz) 107.1 kg (236 lb 1.8 oz)    History of present illness:   As per Dr. Wyline [name] on 7/19: Patricia [name] is a 45 y.o. female with medical history significant of hypertension, diabetes, end-stage renal disease on Monday Wednesday Friday hemodialysis presents to the emergency department with complaints of progressively worsening chest pain and nausea. Patient describes chest pain as being left-sided with radiation to the right flank. Symptoms improved with Tums. Did not seem to be positional. Given worsening symptoms, patient decided to present to the emergency department  ED Course: In emergency department, patient was noted have troponins of 0.04 with unremarkable EKG. Patient was given nitroglycerin 1 with improvement of chest pain. Given concerns of cardiac risk factors, hospital service is consulted for consideration for admission  On further questioning, patient reports [name]-standing history of marked constipation. Also, patient is status post recent negative stress test approximately 2 months prior to hospital admission.  Hospital Course:   Chest pain -Troponins have been negative, EKG without acute ischemic  abnormalities. -Patient had a recent negative stress test. -Chest pain improves with Tums, has been started on PPI with improvement; this would indicate CP is GI in origin.   End-stage renal disease -To continue hemodialysis as scheduled.  Insulin-dependent diabetes -Fair control, continue outpatient management.  Abdominal pain, diffuse -Suspected due to constipation with large stool burden on x-ray. -She has had multiple bowel movements in the hospital with miralax, Linzess , GoLYTELY -EGD on 7/25 with normal esophagus, gastric stenosis at the pillars which is dilated, gastritis, normal duodenum.  Procedures:   EGD as above   Consultations:   Gastroenterology  Discharge Instructions  Discharge Instructions    Diet - low sodium heart healthy    Complete by:  As directed   Increase activity slowly    Complete by:  As directed       Medication List    TAKE these medications   aspirin 81 MG tablet Take 81 mg by mouth daily.   DIALYVITE 800 0.8 MG Tabs Take 1 tablet by mouth daily.   furosemide 40 MG tablet Commonly known as:  LASIX Take 40-80 mg by mouth 2 (two) times daily.   glipiZIDE 5 MG tablet Commonly known as:  GLUCOTROL Take 5 mg by mouth 2 (two) times daily before a meal.   labetalol 300 MG tablet Commonly known as:  NORMODYNE Take 1 tablet (300 mg total) by mouth 2 (two) times daily.   lidocaine 2 % solution Commonly known as:  XYLOCAINE 2 TSP PO Q4H AND AT BEDTIME PRN FOR ABDOMINAL/CHEST PAIN. REPEAT DOSE Q4H. NO MORE> 8 DOSES/DAY.   linaclotide 290 MCG Caps capsule Commonly known as:  LINZESS Take 1 capsule (290 mcg total)  by mouth daily before breakfast. Start taking on:  07/12/2016   NIFEdipine 60 MG 24 hr tablet Commonly known as:  PROCARDIA-XL/ADALAT CC Take 1 tablet (60 mg total) by mouth daily.   RENVELA 800 MG tablet Generic drug:  sevelamer carbonate Take 3,200 mg by mouth 3 (three) times daily with meals. & 800 mg with snacks    rOPINIRole 2 MG tablet Commonly known as:  REQUIP Take 2 mg by mouth at bedtime.      Allergies  Allergen Reactions  . Reglan [Metoclopramide] Other (See Comments)    hallucinations  . Amlodipine Besylate Rash and Other (See Comments)    dizziness      The results of significant diagnostics from this hospitalization (including imaging, microbiology, ancillary and laboratory) are listed below for reference.    Significant Diagnostic Studies: Dg Chest 2 View  Result Date: 07/04/2016 CLINICAL DATA:  Shortness of breath.  Chest congestion. EXAM: CHEST  2 VIEW COMPARISON:  12/04/2015. FINDINGS: Mediastinum and hilar structures normal. Mild cardiomegaly and pulmonary venous congestion noted. No pulmonary edema or focal infiltrate. No pleural effusion or pneumothorax. IMPRESSION: Mild cardiomegaly pulmonary venous congestion. No pulmonary edema or focal infiltrate. Electronically Signed   By: Marcello Moores  Register   On: 07/04/2016 06:42   Dg Abd 1 View  Result Date: 07/06/2016 CLINICAL DATA:  Patient with generalized abdominal pain and cramping for 1 month. EXAM: ABDOMEN - 1 VIEW COMPARISON:  MRI abdomen 11/23/2013 FINDINGS: Lung bases are clear. Cholecystectomy clips. Gas is demonstrated within nondilated loops of large and small bowel in a nonobstructed pattern. There is a large amount of stool within the cecum, ascending and transverse colon. Supine evaluation limited for the detection of free intraperitoneal air. Osseous skeleton is unremarkable. Vascular calcifications. IMPRESSION: Nonobstructed bowel gas pattern. Large amount of stool within the cecum, ascending and transverse colon, compatible with constipation. Electronically Signed   By: Lovey Newcomer M.D.   On: 07/06/2016 19:16   Dg Abd Portable 1v  Result Date: 07/09/2016 CLINICAL DATA:  Constipation. EXAM: PORTABLE ABDOMEN - 1 VIEW COMPARISON:  Radiographs of July 06, 2016. FINDINGS: The bowel gas pattern is normal. No radio-opaque  calculi or other significant radiographic abnormality are seen. Status post cholecystectomy. IMPRESSION: No evidence of bowel obstruction or ileus. Electronically Signed   By: Marijo Conception, M.D.   On: 07/09/2016 12:10   Microbiology: Recent Results (from the past 240 hour(s))  MRSA PCR Screening     Status: None   Collection Time: 07/04/16 11:29 AM  Result Value Ref Range Status   MRSA by PCR NEGATIVE NEGATIVE Final    Comment:        The GeneXpert MRSA Assay (FDA approved for NASAL specimens only), is one component of a comprehensive MRSA colonization surveillance program. It is not intended to diagnose MRSA infection nor to guide or monitor treatment for MRSA infections.      Labs: Basic Metabolic Panel:  Recent Labs Lab 07/06/16 0433 07/08/16 0552 07/11/16 0552  NA 136 134* 137  K 4.7 4.7 5.9*  CL 98* 97* 99*  CO2 '24 23 27  '$ GLUCOSE 240* 182* 154*  BUN 46* 40* 50*  CREATININE 10.07* 10.55* 11.21*  CALCIUM 9.3 9.4 9.4  PHOS 5.6* 5.1* 8.0*   Liver Function Tests:  Recent Labs Lab 07/06/16 0433 07/08/16 0552 07/11/16 0552  ALBUMIN 3.7 3.9 3.7   No results for input(s): LIPASE, AMYLASE in the last 168 hours. No results for input(s): AMMONIA in the last 168  hours. CBC:  Recent Labs Lab 07/08/16 0552 07/11/16 0549  WBC 10.1 7.4  HGB 11.7* 10.6*  HCT 36.8 33.2*  MCV 94.1 93.8  PLT 305 268   Cardiac Enzymes: No results for input(s): CKTOTAL, CKMB, CKMBINDEX, TROPONINI in the last 168 hours. BNP: BNP (last 3 results) No results for input(s): BNP in the last 8760 hours.  ProBNP (last 3 results) No results for input(s): PROBNP in the last 8760 hours.  CBG:  Recent Labs Lab 07/10/16 1115 07/10/16 1625 07/10/16 2058 07/11/16 0807 07/11/16 1126  GLUCAP 143* 137* 158* 211* 171*       Signed:  HERNANDEZ ACOSTA,ESTELA  [facility] Pager: [phone] 07/11/2016, 4:12 PM

## 2016-07-11 NOTE — Procedures (Signed)
   HEMODIALYSIS TREATMENT NOTE:  4 hour low-heparin dialysis completed via left upper arm AVF (15g/antegrade). Goal met: 2.5 liters removed without interruption in ultrafiltration. All blood was returned and hemostasis was achieved within 15 minutes. Report given to Threasa Alpha, RN.  Rockwell Alexandria, RN, CDN

## 2016-07-11 NOTE — Progress Notes (Signed)
Patient discharged home with husband.  IV removed and site intact. Patient discharged with all belongings, prescriptions, and discharged papers.

## 2016-07-11 NOTE — Progress Notes (Signed)
Subjective:  Patient ate well last night. No nausea. Husband brought food from outside. Had a BM last night. Still feels some pressure around her umbilicus.   Objective: Vital signs in last 24 hours: Temp:  [98 F (36.7 C)-98.4 F (36.9 C)] 98 F (36.7 C) (07/26 0631) Pulse Rate:  [67-82] 75 (07/26 0631) Resp:  [12-35] 20 (07/26 0631) BP: (125-193)/(54-98) 131/61 (07/26 0631) SpO2:  [94 %-100 %] 97 % (07/26 0631) Last BM Date: 07/10/16 General:   Alert,  Well-developed, well-nourished, pleasant and cooperative in NAD Head:  Normocephalic and atraumatic. Eyes:  Sclera clear, no icterus.  Abdomen:  Soft, nontender and nondistended.   Normal bowel sounds, without guarding, and without rebound.   Extremities:  Without clubbing, deformity or edema. Neurologic:  Alert and  oriented x4;  grossly normal neurologically. Skin:  Intact without significant lesions or rashes. Psych:  Alert and cooperative. Normal mood and affect.  Intake/Output from previous day: 07/25 0701 - 07/26 0700 In: 240 [P.O.:240] Out: -  Intake/Output this shift: No intake/output data recorded.  Lab Results: CBC  Recent Labs  07/11/16 0549  WBC 7.4  HGB 10.6*  HCT 33.2*  MCV 93.8  PLT 268   BMET  Recent Labs  07/11/16 0552  NA 137  K 5.9*  CL 99*  CO2 27  GLUCOSE 154*  BUN 50*  CREATININE 11.21*  CALCIUM 9.4   LFTs  Recent Labs  07/11/16 0552  ALBUMIN 3.7   No results for input(s): LIPASE in the last 72 hours. PT/INR No results for input(s): LABPROT, INR in the last 72 hours.    Imaging Studies: Dg Chest 2 View  Result Date: 07/04/2016 CLINICAL DATA:  Shortness of breath.  Chest congestion. EXAM: CHEST  2 VIEW COMPARISON:  12/04/2015. FINDINGS: Mediastinum and hilar structures normal. Mild cardiomegaly and pulmonary venous congestion noted. No pulmonary edema or focal infiltrate. No pleural effusion or pneumothorax. IMPRESSION: Mild cardiomegaly pulmonary venous congestion. No  pulmonary edema or focal infiltrate. Electronically Signed   By: Marcello Moores  Register   On: 07/04/2016 06:42   Dg Abd 1 View  Result Date: 07/06/2016 CLINICAL DATA:  Patient with generalized abdominal pain and cramping for 1 month. EXAM: ABDOMEN - 1 VIEW COMPARISON:  MRI abdomen 11/23/2013 FINDINGS: Lung bases are clear. Cholecystectomy clips. Gas is demonstrated within nondilated loops of large and small bowel in a nonobstructed pattern. There is a large amount of stool within the cecum, ascending and transverse colon. Supine evaluation limited for the detection of free intraperitoneal air. Osseous skeleton is unremarkable. Vascular calcifications. IMPRESSION: Nonobstructed bowel gas pattern. Large amount of stool within the cecum, ascending and transverse colon, compatible with constipation. Electronically Signed   By: Lovey Newcomer M.D.   On: 07/06/2016 19:16   Dg Abd Portable 1v  Result Date: 07/09/2016 CLINICAL DATA:  Constipation. EXAM: PORTABLE ABDOMEN - 1 VIEW COMPARISON:  Radiographs of July 06, 2016. FINDINGS: The bowel gas pattern is normal. No radio-opaque calculi or other significant radiographic abnormality are seen. Status post cholecystectomy. IMPRESSION: No evidence of bowel obstruction or ileus. Electronically Signed   By: Marijo Conception, M.D.   On: 07/09/2016 12:10 [2 weeks]   Assessment: 45 year old female with a history of hypertension, GERD, diabetes, ESRD on hemodialysis with persistent abdominal pain, N/V, constipation, GERD symptoms.   Abdominal pain improved with bowel movements over the past several days on GoLytely and Linzess. Residual pain possibly residual soreness from chronic constipation. No mention of significant stool  burden on repeat KUB. Complains of periumbilical pain, suggest further monitoring to see if this settles down given multiple interventions recently.   N/v improved since yesterday. S/p dilation of pylorus for stenosis. Gastric and duodenal biopsies  pending. No formal testing for gastroparesis and cannot exclude but in this setting would see how she does now that her pylorus has been dilated.    EGD -Normal esophagus. - Gastric stenosis was found at the pylorus. Dilated - Gastritis. Biopsied. - Normal examined duodenum. Biopsied.  Plan: 1. F/U pending pathology. 2. Continue PPI BID for now. Could attempt transition to once daily in one month if tolerated. 3. Increase Linzess to 273mg daily.   LLaureen Ochs LBernarda CaffeyRDetroit ([facility]) [facility] [id number] AM     LOS: 5 days

## 2016-07-11 NOTE — Telephone Encounter (Signed)
Patient needs hospital follow up in 3 months with Dr. Gala Romney regarding N/V, constipation. Please schedule.

## 2016-07-13 ENCOUNTER — Encounter (HOSPITAL_COMMUNITY): Payer: Self-pay | Admitting: Gastroenterology

## 2016-07-15 ENCOUNTER — Telehealth: Payer: Self-pay | Admitting: Gastroenterology

## 2016-07-15 NOTE — Telephone Encounter (Signed)
  Please call pt. HER stomach Bx shows gastritis.  HER SMALL BOWEL BIOPSIES ARE NORMAL.  CONTINUE YOUR WEIGHT LOSS EFFORTS. AVOID THINGS THAT TRIGGER REFLUX. FOLLOW A LOW FAT DIET. CONTINUE PROTONIX. TAKE 30 MINUTES PRIOR TO MEALS TWICE DAILY. CONTINUE LINZESS. FOLLOW UP IN 2 MOS W/ RMR/EG E30 VOMITING/GERD.

## 2016-07-16 NOTE — Telephone Encounter (Signed)
LM for a return call.  

## 2016-07-16 NOTE — Telephone Encounter (Signed)
Reminder in epic °

## 2016-07-18 ENCOUNTER — Telehealth: Payer: Self-pay

## 2016-07-18 DIAGNOSIS — K5900 Constipation, unspecified: Secondary | ICD-10-CM | POA: Diagnosis not present

## 2016-07-18 DIAGNOSIS — R109 Unspecified abdominal pain: Secondary | ICD-10-CM | POA: Diagnosis not present

## 2016-07-18 DIAGNOSIS — Z681 Body mass index (BMI) 19 or less, adult: Secondary | ICD-10-CM | POA: Diagnosis not present

## 2016-07-18 NOTE — Telephone Encounter (Signed)
Pt was returning a call, but she was at dialysis. She asked for you to call her cell [phone]

## 2016-07-18 NOTE — Telephone Encounter (Signed)
Pt is aware of results. 

## 2016-07-18 NOTE — Telephone Encounter (Signed)
LMOM to call. Letter mailed to call also.

## 2016-07-18 NOTE — Telephone Encounter (Signed)
Pt is aware of her results.  

## 2016-07-20 ENCOUNTER — Encounter: Payer: Self-pay | Admitting: Internal Medicine

## 2016-07-31 DIAGNOSIS — Z7682 Awaiting organ transplant status: Secondary | ICD-10-CM | POA: Diagnosis not present

## 2016-08-02 ENCOUNTER — Ambulatory Visit (INDEPENDENT_AMBULATORY_CARE_PROVIDER_SITE_OTHER): Payer: PRIVATE HEALTH INSURANCE | Admitting: Nurse Practitioner

## 2016-08-02 ENCOUNTER — Encounter: Payer: Self-pay | Admitting: Nurse Practitioner

## 2016-08-02 VITALS — BP 175/85 | HR 82 | Temp 98.0°F | Ht 63.0 in | Wt 240.4 lb

## 2016-08-02 DIAGNOSIS — K5900 Constipation, unspecified: Secondary | ICD-10-CM | POA: Diagnosis not present

## 2016-08-02 DIAGNOSIS — R1084 Generalized abdominal pain: Secondary | ICD-10-CM | POA: Diagnosis not present

## 2016-08-02 DIAGNOSIS — R112 Nausea with vomiting, unspecified: Secondary | ICD-10-CM | POA: Diagnosis not present

## 2016-08-02 DIAGNOSIS — K219 Gastro-esophageal reflux disease without esophagitis: Secondary | ICD-10-CM | POA: Diagnosis not present

## 2016-08-02 MED ORDER — LUBIPROSTONE 24 MCG PO CAPS: 24.0000 ug | ORAL_CAPSULE | Freq: Two times a day (BID) | ORAL | 3 refills | Status: DC

## 2016-08-02 NOTE — Progress Notes (Signed)
cc'ed to pcp °

## 2016-08-02 NOTE — Assessment & Plan Note (Signed)
Some continued upper abdominal pain, possibly residual GERD. I will increase her Protonix to twice a day until she is seen next in our office to see if this helps with her symptoms. Return for follow-up in 4-6 weeks.

## 2016-08-02 NOTE — Assessment & Plan Note (Signed)
Her abdominal pain is likely multifactorial including GERD and constipation. Further management of these as noted above. Return for follow-up in 4-6 weeks.

## 2016-08-02 NOTE — Assessment & Plan Note (Signed)
Chronic nausea and vomiting, did have a pyloric stenosis. She also likely has a component of gastroparesis given her multiple chronic comorbidities. I'll attempt a gastric emptying study at this time to further evaluate. Return for follow-up in 4-6 weeks.

## 2016-08-02 NOTE — Assessment & Plan Note (Signed)
Continued significant constipation despite Linzess 290 g. I will switch her to Amitiza 24 g with MiraLAX 17 g as needed for no bowel movement in 2 days. We will see if his switch and agent helps her constipation. She is somewhat limited with inability to drink enough fluids because of her end-stage renal disease on hemodialysis. I will also provide her with dietary instructions related to GERD and gastroparesis. Return for follow-up in 4-6 weeks.

## 2016-08-02 NOTE — Progress Notes (Addendum)
Referring Provider: Jake Samples, PA* Primary Care Physician:  [name] Primary GI:  Dr.   Chief Complaint  Patient presents with  . Abdominal Pain    sharp pain in stomach and chest (gas pain), in hosp approx 3 weeks ago  . Constipation    HPI:   Patricia [name] is a 45 y.o. female who presents Post hospital follow-up. The patient was admitted from the hospital from 07/04/2016 through 07/11/2016. We saw the patient in the hospital in consultation for abdominal pain. At that time she was noted to be a difficult historian. She describes persistent abdominal pain, nausea/vomiting, constipation, GERD symptoms. Symptoms do at least in part due to constipation. Also deemed likely uncontrolled GERD as a contributor, taking Protonix intermittently. Also possible component of gastroparesis. She was started on Linzess 145 g and Protonix in hospital. Her GERD symptoms improved and abdominal pain wasn't deemed migratory with continued constipation.  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 Recommended Protonix twice a day and Zofran as needed. Surgical pathology found stomach biopsy to be benign gastric mucosa with chronic inactive gastritis, duodenum biopsy with benign small bowel mucosa.  Patient is a difficult historian. Today she states she is still having abdominal pain across her mid-abdomen and lower abdomen. Also with occasional gas pain in the LUQ described as burning. Is on Linzess but still not having normal bowel movements. If she takes her Linzess and binder together she has dark runny stool. Has a bowel movement about every 2-3 days, hard stools with straining, minimal amount of stool, incomplete emptying. Still with epigastric pain frequently with no association with food. Takes Protonix once daily. Viscous lidocaine doesn't get past her throat. Her  abdominal pain is worse when getting dialysis. Admits chronic N/V. Denies hematochezia, fever, chill. Denies any other upper or lower GI symptoms.  Past Medical History:  Diagnosis Date  . Blood transfusion without reported diagnosis   . Cancer ([place])   . Diabetes mellitus without complication ([place])   . Dialysis patient ([place])    mon, wed, friday,   . Hypertension   . Renal disorder   . Renal insufficiency     Past Surgical History:  Procedure Laterality Date  . ABDOMINAL HYSTERECTOMY    . AV FISTULA PLACEMENT  11/2014   at [place] N/A 07/10/2016   Procedure: BALLOON DILATION;  Surgeon: Danie Binder, MD;  Location: [facility];  Service: Endoscopy;  Laterality: N/A;  Pyloric dilation  . CESAREAN SECTION    . CHOLECYSTECTOMY    . DILATION AND CURETTAGE OF UTERUS    . ESOPHAGOGASTRODUODENOSCOPY N/A 07/10/2016   Procedure: ESOPHAGOGASTRODUODENOSCOPY (EGD);  Surgeon: Danie Binder, MD;  Location: [facility];  Service: Endoscopy;  Laterality: N/A;    Current Outpatient Prescriptions  Medication Sig Dispense Refill  . aspirin 81 MG tablet Take 81 mg by mouth daily.    . Ferric Citrate 1 GM 210 MG(Fe) TABS Take 1 tablet by mouth 2 (two) times daily. Takes 1 tab with meal    . furosemide (LASIX) 40 MG tablet Take 40-80 mg by mouth 2 (two) times daily.     Marland Kitchen glipiZIDE (GLUCOTROL) 5 MG tablet Take 5 mg by mouth 2 (two) times daily before a meal.    . labetalol (NORMODYNE) 300 MG tablet Take 1 tablet (300 mg total) by mouth 2 (two)  times daily. 60 tablet 2  . lidocaine (XYLOCAINE) 2 % solution 2 TSP PO Q4H AND AT BEDTIME PRN FOR ABDOMINAL/CHEST PAIN. REPEAT DOSE Q4H. NO MORE> 8 DOSES/DAY. 300 mL 5  . linaclotide (LINZESS) 290 MCG CAPS capsule Take 1 capsule (290 mcg total) by mouth daily before breakfast. 30 capsule 2  . multivitamin (RENA-VIT) TABS tablet Take 1 tablet by mouth daily.    Marland Kitchen NIFEdipine (PROCARDIA-XL/ADALAT CC) 60 MG 24 hr tablet Take 1 tablet  (60 mg total) by mouth daily. 30 tablet 2  . pantoprazole (PROTONIX) 20 MG tablet Take 1 tablet by mouth daily.    Marland Kitchen rOPINIRole (REQUIP) 2 MG tablet Take 2 mg by mouth at bedtime.    . sevelamer carbonate (RENVELA) 800 MG tablet Take 800 mg by mouth 2 (two) times daily. Takes 1 tab with snack     No current facility-administered medications for this visit.     Allergies as of 08/02/2016 - Review Complete 08/02/2016  Allergen Reaction Noted  . Reglan [metoclopramide] Other (See Comments) 12/04/2015  . Amlodipine besylate Rash and Other (See Comments) 12/04/2015    Family History  Problem Relation Age of Onset  . Colon cancer Neg Hx     Social History   Social History  . Marital status: Married    Spouse name: N/A  . Number of children: N/A  . Years of education: N/A   Social History Main Topics  . Smoking status: Never Smoker  . Smokeless tobacco: Never Used  . Alcohol use No  . Drug use: No  . Sexual activity: Not Asked   Other Topics Concern  . None   Social History Narrative  . None    Review of Systems: General: Negative for anorexia, weight loss, fever, chills, fatigue, weakness. ENT: Negative for hoarseness, difficulty swallowing. CV: Negative for chest pain, angina, palpitations, peripheral edema.  Respiratory: Negative for dyspnea at rest, cough, sputum, wheezing.  GI: See history of present illness. Endo: Negative for unusual weight change.  Heme: Negative for bruising or bleeding.   Physical Exam: BP (!) 175/85   Pulse 82   Temp 98 F (36.7 C) (Oral)   Ht '5\' 3"'$  (1.6 m)   Wt 240 lb 6.4 oz (109 kg)   BMI 42.58 kg/m  General:   Alert and oriented. Pleasant and cooperative. Well-nourished and well-developed.  Ears:  Normal auditory acuity. Cardiovascular:  S1, S2 present without murmurs appreciated. Extremities without clubbing or edema. Respiratory:  Clear to auscultation bilaterally. No wheezes, rales, or rhonchi. No distress.  Gastrointestinal:   +BS, obese but soft, and non-distended. Mild to moderate lower abdominal TTP, no epigastric TTP. No HSM noted. No guarding or rebound. No masses appreciated.  Rectal:  Deferred  Musculoskalatal:  Symmetrical without gross deformities. Skin:  AV fistula left upper arm +bruit and +thrill Neurologic:  Alert and oriented x4;  grossly normal neurologically. Psych:  Alert and cooperative. Normal mood and affect. Heme/Lymph/Immune: No excessive bruising noted.    08/02/2016 9:17 AM   Disclaimer: This note was dictated with voice recognition software. Similar sounding words can inadvertently be transcribed and may not be corrected upon review.

## 2016-08-02 NOTE — Patient Instructions (Addendum)
1. We will help schedule your stomach emptying test. 2. Stop taking Linzess. 3. Start taking Amitiza 24 g. I've sent to prescription to your pharmacy. 4. He can also take MiraLAX one capful as needed for no bowel movement in 2 days. 5. Return for follow-up in 4-6 weeks. 6. I'm providing diet instructions below related to gastric emptying and acid reflux.    Food Choices for Gastroesophageal Reflux Disease, Adult When you have gastroesophageal reflux disease (GERD), the foods you eat and your eating habits are very important. Choosing the right foods can help ease the discomfort of GERD. WHAT GENERAL GUIDELINES DO I NEED TO FOLLOW?  Choose fruits, vegetables, whole grains, low-fat dairy products, and low-fat meat, fish, and poultry.  Limit fats such as oils, salad dressings, butter, nuts, and avocado.  Keep a food diary to identify foods that cause symptoms.  Avoid foods that cause reflux. These may be different for different people.  Eat frequent small meals instead of three large meals each day.  Eat your meals slowly, in a relaxed setting.  Limit fried foods.  Cook foods using methods other than frying.  Avoid drinking alcohol.  Avoid drinking large amounts of liquids with your meals.  Avoid bending over or lying down until 2-3 hours after eating. WHAT FOODS ARE NOT RECOMMENDED? The following are some foods and drinks that may worsen your symptoms: Vegetables Tomatoes. Tomato juice. Tomato and spaghetti sauce. Chili peppers. Onion and garlic. Horseradish. Fruits Oranges, grapefruit, and lemon (fruit and juice). Meats High-fat meats, fish, and poultry. This includes hot dogs, ribs, ham, sausage, salami, and bacon. Dairy Whole milk and chocolate milk. Sour cream. Cream. Butter. Ice cream. Cream cheese.  Beverages Coffee and tea, with or without caffeine. Carbonated beverages or energy drinks. Condiments Hot sauce. Barbecue sauce.  Sweets/Desserts Chocolate and cocoa.  Donuts. Peppermint and spearmint. Fats and Oils High-fat foods, including [place] fries and potato chips. Other Vinegar. Strong spices, such as black pepper, white pepper, red pepper, cayenne, curry powder, cloves, ginger, and chili powder. The items listed above may not be a complete list of foods and beverages to avoid. Contact your dietitian for more information.   This information is not intended to replace advice given to you by your health care provider. Make sure you discuss any questions you have with your health care provider.   Document Released: 12/03/2005 Document Revised: 12/24/2014 Document Reviewed: 10/07/2013 Elsevier Interactive Patient Education 2016 Elsevier Inc.     Gastroparesis Gastroparesis, also called delayed gastric emptying, is a condition in which food takes longer than normal to empty from the stomach. The condition is usually Gonz[place]-lasting (chronic). CAUSES This condition may be caused by:  An endocrine disorder, such as hypothyroidism or diabetes. Diabetes is the most common cause of this condition.  A nervous system disease, such as Parkinson disease or multiple sclerosis.  Cancer, infection, or surgery of the stomach or vagus nerve.  A connective tissue disorder, such as scleroderma.  Certain medicines. In most cases, the cause is not known. RISK FACTORS This condition is more likely to develop in:  People with certain disorders, including endocrine disorders, eating disorders, amyloidosis, and scleroderma.  People with certain diseases, including Parkinson disease or multiple sclerosis.  People with cancer or infection of the stomach or vagus nerve.  People who have had surgery on the stomach or vagus nerve.  People who take certain medicines.  Women. SYMPTOMS Symptoms of this condition include:  An early feeling of fullness when eating.  Nausea.  Weight loss.  Vomiting.  Heartburn.  Abdominal bloating.  Inconsistent blood  glucose levels.  Lack of appetite.  Acid from the stomach coming up into the esophagus (gastroesophageal reflux).  Spasms of the stomach. Symptoms may come and go. DIAGNOSIS This condition is diagnosed with tests, such as:  Tests that check how K[place] it takes food to move through the stomach and intestines. These tests include:  Upper gastrointestinal (GI) series. In this test, X-rays of the intestines are taken after you drink a liquid. The liquid makes the intestines show up better on the X-rays.  Gastric emptying scintigraphy. In this test, scans are taken after you eat food that contains a small amount of radioactive material.  Wireless capsule GI monitoring system. This test involves swallowing a capsule that records information about movement through the stomach.  Gastric manometry. This test measures electrical and muscular activity in the stomach. It is done with a thin tube that is passed down the throat and into the stomach.  Endoscopy. This test checks for abnormalities in the lining of the stomach. It is done with a [place], thin tube that is passed down the throat and into the stomach.  An ultrasound. This test can help rule out gallbladder disease or pancreatitis as a cause of your symptoms. It uses sound waves to take pictures of the inside of your body. TREATMENT There is no cure for gastroparesis. This condition may be managed with:  Treatment of the underlying condition causing the gastroparesis.  Lifestyle changes, including exercise and dietary changes. Dietary changes can include:  Changes in what and when you eat.  Eating smaller meals more often.  Eating low-fat foods.  Eating low-fiber forms of high-fiber foods, such as cooked vegetables instead of raw vegetables.  Having liquid foods in place of solid foods. Liquid foods are easier to digest.  Medicines. These may be given to control nausea and vomiting and to stimulate stomach muscles.  Getting food  through a feeding tube. This may be done in severe cases.  A gastric neurostimulator. This is a device that is inserted into the body with surgery. It helps improve stomach emptying and control nausea and vomiting. HOME CARE INSTRUCTIONS  Follow your health care provider's instructions about exercise and diet.  Take medicines only as directed by your health care provider. SEEK MEDICAL CARE IF:  Your symptoms do not improve with treatment.  You have new symptoms. SEEK IMMEDIATE MEDICAL CARE IF:  You have severe abdominal pain that does not improve with treatment.  You have nausea that does not go away.  You cannot keep fluids down.   This information is not intended to replace advice given to you by your health care provider. Make sure you discuss any questions you have with your health care provider.   Document Released: 12/03/2005 Document Revised: 04/19/2015 Document Reviewed: 11/29/2014 Elsevier Interactive Patient Education [employer].

## 2016-08-07 ENCOUNTER — Encounter (HOSPITAL_COMMUNITY): Payer: Medicare Other

## 2016-08-09 ENCOUNTER — Encounter (HOSPITAL_COMMUNITY)
Admission: RE | Admit: 2016-08-09 | Discharge: 2016-08-09 | Disposition: A | Discharge status: Home / Self Care | Payer: PRIVATE HEALTH INSURANCE | Source: Ambulatory Visit | Attending: Nurse Practitioner | Admitting: Nurse Practitioner

## 2016-08-09 ENCOUNTER — Encounter (HOSPITAL_COMMUNITY): Payer: Self-pay

## 2016-08-09 DIAGNOSIS — R109 Unspecified abdominal pain: Secondary | ICD-10-CM | POA: Diagnosis not present

## 2016-08-09 DIAGNOSIS — R1084 Generalized abdominal pain: Secondary | ICD-10-CM | POA: Insufficient documentation

## 2016-08-09 DIAGNOSIS — K219 Gastro-esophageal reflux disease without esophagitis: Secondary | ICD-10-CM | POA: Diagnosis not present

## 2016-08-09 DIAGNOSIS — K5900 Constipation, unspecified: Secondary | ICD-10-CM

## 2016-08-09 DIAGNOSIS — R112 Nausea with vomiting, unspecified: Secondary | ICD-10-CM | POA: Diagnosis not present

## 2016-08-09 MED ORDER — TECHNETIUM TC 99M SULFUR COLLOID
2.0000 | Freq: Once | INTRAVENOUS | Status: AC | PRN
  Administered 2016-08-09: 2 via ORAL

## 2016-08-13 DIAGNOSIS — D631 Anemia in chronic kidney disease: Secondary | ICD-10-CM | POA: Diagnosis not present

## 2016-08-13 DIAGNOSIS — N2581 Secondary hyperparathyroidism of renal origin: Secondary | ICD-10-CM | POA: Diagnosis not present

## 2016-08-13 DIAGNOSIS — Z992 Dependence on renal dialysis: Secondary | ICD-10-CM | POA: Diagnosis not present

## 2016-08-13 DIAGNOSIS — N186 End stage renal disease: Secondary | ICD-10-CM | POA: Diagnosis not present

## 2016-08-28 ENCOUNTER — Ambulatory Visit: Payer: PRIVATE HEALTH INSURANCE | Admitting: Internal Medicine

## 2016-09-10 DIAGNOSIS — D631 Anemia in chronic kidney disease: Secondary | ICD-10-CM | POA: Diagnosis not present

## 2016-09-10 DIAGNOSIS — N2581 Secondary hyperparathyroidism of renal origin: Secondary | ICD-10-CM | POA: Diagnosis not present

## 2016-09-11 ENCOUNTER — Ambulatory Visit (INDEPENDENT_AMBULATORY_CARE_PROVIDER_SITE_OTHER): Payer: PRIVATE HEALTH INSURANCE | Admitting: Internal Medicine

## 2016-09-11 ENCOUNTER — Other Ambulatory Visit: Payer: Self-pay

## 2016-09-11 ENCOUNTER — Encounter: Payer: Self-pay | Admitting: Internal Medicine

## 2016-09-11 VITALS — BP 182/83 | HR 77 | Temp 98.1°F | Ht 63.0 in | Wt 240.0 lb

## 2016-09-11 DIAGNOSIS — K5909 Other constipation: Secondary | ICD-10-CM

## 2016-09-11 DIAGNOSIS — K219 Gastro-esophageal reflux disease without esophagitis: Secondary | ICD-10-CM

## 2016-09-11 DIAGNOSIS — Z1211 Encounter for screening for malignant neoplasm of colon: Secondary | ICD-10-CM

## 2016-09-11 MED ORDER — PEG 3350-KCL-NA BICARB-NACL 420 G PO SOLR: 4000.0000 mL | ORAL | 0 refills | Status: DC

## 2016-09-11 NOTE — Patient Instructions (Signed)
Schedule a screening colonoscopy - Propofol  One extra day of clear liquids; Double prep  For now; stop Protonix ; try Dexilant 60 mg daily - samples  Continue Amitiza twice daily  Add one dose of Miralax at bedtime  Further recommendations to follow

## 2016-09-11 NOTE — Progress Notes (Signed)
Primary Care Physician:  [name] Primary Gastroenterologist:  Dr. Gala Romney Pre-Procedure History & Physical: HPI:  Patricia [name] is a 45 y.o. female here for followup of GERD and obstipation. Hospitalized with abdominal pain. EGD essentially negative. Chronically constipated or loose constipated over the past several months since going on dialysis. Has abdominal pain during dialysis. Despite taking Protonix twice a day, no improvement in her dyspepsia GERD symptoms. She tells me that comes gives her nearly instantaneous relief in her symptoms however, she's been advised not to take them because of her renal failure. No prior colonoscopy. She is 44. Amitiza is tolerated well but does not produce a good bowel movement daily. Feels she is "backed up". No opioids. Has taken MiraLax the past but doesn't take currently.  Past Medical History:  Diagnosis Date  . Blood transfusion without reported diagnosis   . Cancer ([place])   . Diabetes mellitus without complication ([place])   . Dialysis patient ([place])    mon, wed, friday,   . Hypertension   . Renal disorder   . Renal insufficiency     Past Surgical History:  Procedure Laterality Date  . ABDOMINAL HYSTERECTOMY    . AV FISTULA PLACEMENT  11/2014   at [place] N/A 07/10/2016   Procedure: BALLOON DILATION;  Surgeon: Danie Binder, MD;  Location[facility];  Service: Endoscopy;  Laterality: N/A;  Pyloric dilation  . CESAREAN SECTION    . CHOLECYSTECTOMY    . DILATION AND CURETTAGE OF UTERUS    . ESOPHAGOGASTRODUODENOSCOPY N/A 07/10/2016   Dr.Fields- normal esophagus, gastric stenosis was found at the pylorus, gastritis on bx, normal examined duodenun    Prior to Admission medications   Medication Sig Start Date End Date Taking? Authorizing Provider  aspirin 81 MG tablet Take 81 mg by mouth daily.   Yes Historical Provider, MD  Ferric Citrate 1 GM 210 MG(Fe) TABS Take 1 tablet by mouth 2 (two) times daily.  Takes 1 tab with meal   Yes Historical Provider, MD  furosemide (LASIX) 40 MG tablet Take 40-80 mg by mouth 2 (two) times daily.    Yes Historical Provider, MD  glipiZIDE (GLUCOTROL) 5 MG tablet Take 5 mg by mouth 2 (two) times daily before a meal.   Yes Historical Provider, MD  labetalol (NORMODYNE) 300 MG tablet Take 1 tablet (300 mg total) by mouth 2 (two) times daily. 12/06/15  Yes Erline Hau, MD  lidocaine (XYLOCAINE) 2 % solution 2 TSP PO Q4H AND AT BEDTIME PRN FOR ABDOMINAL/CHEST PAIN. REPEAT DOSE Q4H. NO MORE> 8 DOSES/DAY. 07/11/16  Yes Danie Binder, MD  lubiprostone (AMITIZA) 24 MCG capsule Take 1 capsule (24 mcg total) by mouth 2 (two) times daily with a meal. 08/02/16  Yes Carlis Stable, NP  multivitamin (RENA-VIT) TABS tablet Take 1 tablet by mouth daily.   Yes Historical Provider, MD  NIFEdipine (PROCARDIA-XL/ADALAT CC) 60 MG 24 hr tablet Take 1 tablet (60 mg total) by mouth daily. 12/06/15  Yes Erline Hau, MD  pantoprazole (PROTONIX) 20 MG tablet Take 1 tablet by mouth 2 (two) times daily. 07/18/16  Yes Historical Provider, MD  rOPINIRole (REQUIP) 2 MG tablet Take 2 mg by mouth at bedtime.   Yes Historical Provider, MD  sevelamer carbonate (RENVELA) 800 MG tablet Take 800 mg by mouth 2 (two) times daily. Takes 1 tab with snack   Yes Historical Provider, MD    Allergies as of  09/11/2016 - Review Complete 09/11/2016  Allergen Reaction Noted  . Reglan [metoclopramide] Other (See Comments) 12/04/2015  . Amlodipine besylate Rash and Other (See Comments) 12/04/2015    Family History  Problem Relation Age of Onset  . Colon cancer Neg Hx     Social History   Social History  . Marital status: Married    Spouse name: N/A  . Number of children: N/A  . Years of education: N/A   Occupational History  . Not on file.   Social History Main Topics  . Smoking status: Never Smoker  . Smokeless tobacco: Never Used  . Alcohol use No  . Drug use: No  . Sexual  activity: Not on file   Other Topics Concern  . Not on file   Social History Narrative  . No narrative on file    Review of Systems: See HPI, otherwise negative ROS  Physical Exam: BP (!) 182/83   Pulse 77   Temp 98.1 F (36.7 C) (Oral)   Ht '5\' 3"'$  (1.6 m)   Wt 240 lb (108.9 kg)   BMI 42.51 kg/m  General:   Alert,   pleasant and cooperative in NAD Skin:  Intact without significant lesions or rashes. Neck:  Supple; no masses or thyromegaly. No significant cervical adenopathy. Lungs:  Clear throughout to auscultation.   No wheezes, crackles, or rhonchi. No acute distress. Heart:  Regular rate and rhythm; no murmurs, clicks, rubs,  or gallops. Abdomen: obese. Positive bowel sounds. Soft nontender without obvious mass or organomegaly Pulses:  Normal pulses noted. Extremities:  1+ LE edema   Impression:  45 year old lady with multiple medical problems chronic including chronic renal failure on hemodialysis. Chronic abdominal pain/obstipation/constipation. Dyspepsia and GERD. Protonix twice a day not really helping. Amitiza not effective in adequately evacuating her colon. Lenses did not work. Relieved with calcium containing antacids is interesting. According to [employer] [facility] gastrology she should have her first colonoscopy at age 51.  Recommendations:  Schedule a screening colonoscopy - Propofol.  The risks, benefits, limitations, alternatives and imponderables have been reviewed with the patient. Questions have been answered. All parties are agreeable.   One extra day of clear liquids; Double prep  For now; stop Protonix ; try Dexilant 60 mg daily - samples  Continue Amitiza twice daily  Add one dose of Miralax at bedtime  Serum TSH if not done recently.  Further recommendations to follow     Notice: This dictation was prepared with Dragon dictation along with smaller phrase technology. Any transcriptional errors that result from this process are unintentional and  may not be corrected upon review.

## 2016-09-13 ENCOUNTER — Telehealth: Payer: Self-pay

## 2016-09-13 NOTE — Telephone Encounter (Signed)
Pt called- left a voicemail. She said her stomach was sore and it felt like there was a knot near her belly button. I tried to call the pt back, NA-LMOM

## 2016-09-13 NOTE — Telephone Encounter (Signed)
Communication noted.  

## 2016-09-14 ENCOUNTER — Emergency Department (HOSPITAL_COMMUNITY)
Admission: EM | Admit: 2016-09-14 | Discharge: 2016-09-14 | Disposition: A | Discharge status: Home / Self Care | Payer: PRIVATE HEALTH INSURANCE | Attending: Emergency Medicine | Admitting: Emergency Medicine

## 2016-09-14 ENCOUNTER — Emergency Department (HOSPITAL_COMMUNITY): Payer: PRIVATE HEALTH INSURANCE

## 2016-09-14 ENCOUNTER — Encounter (HOSPITAL_COMMUNITY): Payer: Self-pay | Admitting: Emergency Medicine

## 2016-09-14 ENCOUNTER — Telehealth: Payer: Self-pay | Admitting: General Practice

## 2016-09-14 DIAGNOSIS — Z7984 Long term (current) use of oral hypoglycemic drugs: Secondary | ICD-10-CM | POA: Insufficient documentation

## 2016-09-14 DIAGNOSIS — N186 End stage renal disease: Secondary | ICD-10-CM | POA: Insufficient documentation

## 2016-09-14 DIAGNOSIS — I120 Hypertensive chronic kidney disease with stage 5 chronic kidney disease or end stage renal disease: Secondary | ICD-10-CM | POA: Insufficient documentation

## 2016-09-14 DIAGNOSIS — L03311 Cellulitis of abdominal wall: Secondary | ICD-10-CM | POA: Diagnosis not present

## 2016-09-14 DIAGNOSIS — Z7982 Long term (current) use of aspirin: Secondary | ICD-10-CM | POA: Diagnosis not present

## 2016-09-14 DIAGNOSIS — Z79899 Other long term (current) drug therapy: Secondary | ICD-10-CM | POA: Insufficient documentation

## 2016-09-14 DIAGNOSIS — R109 Unspecified abdominal pain: Secondary | ICD-10-CM | POA: Diagnosis not present

## 2016-09-14 DIAGNOSIS — E119 Type 2 diabetes mellitus without complications: Secondary | ICD-10-CM | POA: Diagnosis not present

## 2016-09-14 LAB — CBC
HEMATOCRIT: 37.5 % (ref 36.0–46.0)
Hemoglobin: 11.9 g/dL — ABNORMAL LOW (ref 12.0–15.0)
MCH: 29.2 pg (ref 26.0–34.0)
MCHC: 31.7 g/dL (ref 30.0–36.0)
MCV: 92.1 fL (ref 78.0–100.0)
Platelets: 242 10*3/uL (ref 150–400)
RBC: 4.07 MIL/uL (ref 3.87–5.11)
RDW: 14.2 % (ref 11.5–15.5)
WBC: 8.5 10*3/uL (ref 4.0–10.5)

## 2016-09-14 LAB — COMPREHENSIVE METABOLIC PANEL
ALBUMIN: 4.1 g/dL (ref 3.5–5.0)
ALT: 18 U/L (ref 14–54)
AST: 20 U/L (ref 15–41)
Alkaline Phosphatase: 97 U/L (ref 38–126)
Anion gap: 9 (ref 5–15)
BILIRUBIN TOTAL: 0.5 mg/dL (ref 0.3–1.2)
BUN: 19 mg/dL (ref 6–20)
CO2: 26 mmol/L (ref 22–32)
Calcium: 7.9 mg/dL — ABNORMAL LOW (ref 8.9–10.3)
Chloride: 99 mmol/L — ABNORMAL LOW (ref 101–111)
Creatinine, Ser: 5.85 mg/dL — ABNORMAL HIGH (ref 0.44–1.00)
GFR calc Af Amer: 9 mL/min — ABNORMAL LOW (ref >60)
GFR calc non Af Amer: 8 mL/min — ABNORMAL LOW (ref >60)
GLUCOSE: 202 mg/dL — AB (ref 65–99)
POTASSIUM: 3.9 mmol/L (ref 3.5–5.1)
Sodium: 134 mmol/L — ABNORMAL LOW (ref 135–145)
TOTAL PROTEIN: 7.9 g/dL (ref 6.5–8.1)

## 2016-09-14 LAB — URINALYSIS, ROUTINE W REFLEX MICROSCOPIC
BILIRUBIN URINE: NEGATIVE
KETONES UR: NEGATIVE mg/dL
Leukocytes, UA: NEGATIVE
NITRITE: NEGATIVE
PH: 7.5 (ref 5.0–8.0)
Protein, ur: >300 mg/dL — AB
Specific Gravity, Urine: 1.015 (ref 1.005–1.030)

## 2016-09-14 LAB — URINE MICROSCOPIC-ADD ON: RBC / HPF: NONE SEEN RBC/hpf (ref 0–5)

## 2016-09-14 LAB — LIPASE, BLOOD: Lipase: 42 U/L (ref 11–51)

## 2016-09-14 MED ORDER — IOPAMIDOL (ISOVUE-300) INJECTION 61%
INTRAVENOUS | Status: AC
  Administered 2016-09-14: 15 mL
  Filled 2016-09-14: qty 30

## 2016-09-14 MED ORDER — CEPHALEXIN 250 MG PO CAPS: 250.0000 mg | ORAL_CAPSULE | Freq: Four times a day (QID) | ORAL | 0 refills | Status: DC

## 2016-09-14 NOTE — Telephone Encounter (Signed)
Patient informed to go to the ER

## 2016-09-14 NOTE — Telephone Encounter (Signed)
This sounds like a new problem. May have a periumbilical hernia or abdominal wall abscess, etc. May need an urgent CT scan. Best approach is go to the ED.

## 2016-09-14 NOTE — Discharge Instructions (Signed)
The CAT scan showed a mild area of inflammation around your umbilical  area. It may be a mild cellulitis.  We will prescribe an antibiotic to help infection. Also, use heat on the sore area 3 or 4 times a day.

## 2016-09-14 NOTE — ED Provider Notes (Signed)
[facility] [facility] DEPT Provider Note   CSN: [id number] Arrival date & time: 09/14/16  1556     History   Chief Complaint Chief Complaint  Patient presents with  . Abdominal Pain    HPI Patricia [name] is a 45 y.o. female.  She is here for evaluation of abdominal pain with "a knot" which she feels in the umbilical region. She has been by chronic constipation and was recently changed her new medication by her gastroenterologist. Today she called them and they recommend that she come here for a CT scan to see if she had a hernia. She denies nausea, vomiting, fever, chills, chest pain, or dizziness. She is having bowel movements, but they are small and hard. She is taking dialysis regularly 3 times a week. She is taking all of her medicines as directed. There are no other known modifying factors.  HPI  Past Medical History:  Diagnosis Date  . Blood transfusion without reported diagnosis   . Cancer ([place])   . Diabetes mellitus without complication ([place])   . Dialysis patient ([place])    mon, wed, friday,   . Hypertension   . Renal disorder   . Renal insufficiency     Patient Active Problem List   Diagnosis Date Noted  . GERD (gastroesophageal reflux disease) 08/02/2016  . Pyloric stenosis, acquired   . Nausea with vomiting   . Generalized abdominal pain   . Chest pain, rule out acute myocardial infarction 07/04/2016  . Constipation 07/04/2016  . Nausea 07/04/2016  . Essential hypertension 07/04/2016  . HCAP (healthcare-associated pneumonia) 12/04/2015  . ESRD on dialysis ([place]) 12/04/2015  . Diabetes mellitus ([place]) 12/04/2015    Past Surgical History:  Procedure Laterality Date  . ABDOMINAL HYSTERECTOMY    . AV FISTULA PLACEMENT  11/2014   at [facility] N/A 07/10/2016   Procedure: BALLOON DILATION;  Surgeon: Danie Binder, MD;  Location: [facility] SU[facility];  Service: Endoscopy;  Laterality: N/A;  Pyloric dilation  . CESAREAN SECTION    . CHOLECYSTECTOMY    .  DILATION AND CURETTAGE OF UTERUS    . ESOPHAGOGASTRODUODENOSCOPY N/A 07/10/2016   Dr.Fields- normal esophagus, gastric stenosis was found at the pylorus, gastritis on bx, normal examined duodenun    OB History    Gravida Para Term Preterm AB Living   '3 2 1 1 1     '$ SAB TAB Ectopic Multiple Live Births   1               Home Medications    Prior to Admission medications   Medication Sig Start Date End Date Taking? Authorizing Provider  aspirin 81 MG tablet Take 81 mg by mouth daily.   Yes Historical Provider, MD  dexlansoprazole (DEXILANT) 60 MG capsule Take 60 mg by mouth every morning.   Yes Historical Provider, MD  Ferric Citrate (AURYXIA) 1 GM 210 MG(Fe) TABS Take 2 tablets by mouth 2 (two) times daily with a meal.   Yes Historical Provider, MD  furosemide (LASIX) 40 MG tablet Take 40-80 mg by mouth 2 (two) times daily.    Yes Historical Provider, MD  glipiZIDE (GLUCOTROL) 5 MG tablet Take 5 mg by mouth 2 (two) times daily as needed. For blood sugar levels over 150   Yes Historical Provider, MD  labetalol (NORMODYNE) 300 MG tablet Take 1 tablet (300 mg total) by mouth 2 (two) times daily. 12/06/15  Yes Estela Leonie Green, MD  lubiprostone (AMITIZA) 24 MCG capsule  Take 1 capsule (24 mcg total) by mouth 2 (two) times daily with a meal. 08/02/16  Yes Carlis Stable, NP  multivitamin (RENA-VIT) TABS tablet Take 1 tablet by mouth daily.   Yes Historical Provider, MD  NIFEdipine (PROCARDIA-XL/ADALAT CC) 60 MG 24 hr tablet Take 1 tablet (60 mg total) by mouth daily. 12/06/15  Yes Erline Hau, MD  rOPINIRole (REQUIP XL) 2 MG 24 hr tablet Take 2 mg by mouth at bedtime.  09/10/16  Yes Historical Provider, MD  SENSIPAR 30 MG tablet  09/12/16  Yes Historical Provider, MD  sevelamer carbonate (RENVELA) 800 MG tablet Take 800 mg by mouth 2 (two) times daily. Takes 1 tab with snack   Yes Historical Provider, MD  cephALEXin (KEFLEX) 250 MG capsule Take 1 capsule (250 mg total) by mouth  4 (four) times daily. 09/14/16   Daleen Bo, MD  lidocaine (XYLOCAINE) 2 % solution 2 TSP PO Q4H AND AT BEDTIME PRN FOR ABDOMINAL/CHEST PAIN. REPEAT DOSE Q4H. NO MORE> 8 DOSES/DAY. Patient not taking: Reported on 09/14/2016 07/11/16   Danie Binder, MD  polyethylene glycol-electrolytes (TRILYTE) 420 g solution Take 4,000 mLs by mouth as directed. 09/11/16   Daneil Dolin, MD  polyethylene glycol-electrolytes (TRILYTE) 420 g solution Take 4,000 mLs by mouth as directed. Patient not taking: Reported on 09/14/2016 09/11/16   Daneil Dolin, MD    Family History Family History  Problem Relation Age of Onset  . Colon cancer Neg Hx     Social History Social History  Substance Use Topics  . Smoking status: Never Smoker  . Smokeless tobacco: Never Used  . Alcohol use No     Allergies   Reglan [metoclopramide] and Amlodipine besylate   Review of Systems Review of Systems  All other systems reviewed and are negative.    Physical Exam Updated Vital Signs BP 169/76 (BP Location: Left Arm)   Pulse 79   Temp 99.2 F (37.3 C) (Oral)   Resp 16   SpO2 99%   Physical Exam  Constitutional: She is oriented to person, place, and time. She appears well-developed. No distress.  Overweight  HENT:  Head: Normocephalic and atraumatic.  Eyes: Conjunctivae and EOM are normal. Pupils are equal, round, and reactive to light.  Neck: Normal range of motion and phonation normal. Neck supple.  Cardiovascular: Normal rate and regular rhythm.   Dialysis fistula left arm with normal thrill.  Pulmonary/Chest: Effort normal and breath sounds normal. She exhibits no tenderness.  Abdominal: Soft. She exhibits no mass. There is tenderness (Periumbilical without palpable mass or hernia.). There is no rebound and no guarding.  Musculoskeletal: Normal range of motion.  Neurological: She is alert and oriented to person, place, and time. She exhibits normal muscle tone.  Skin: Skin is warm and dry.    Psychiatric: She has a normal mood and affect. Her behavior is normal. Judgment and thought content normal.  Nursing note and vitals reviewed.    ED Treatments / Results  Labs (all labs ordered are listed, but only abnormal results are displayed) Labs Reviewed  COMPREHENSIVE METABOLIC PANEL - Abnormal; Notable for the following:       Result Value   Sodium 134 (*)    Chloride 99 (*)    Glucose, Bld 202 (*)    Creatinine, Ser 5.85 (*)    Calcium 7.9 (*)    GFR calc non Af Amer 8 (*)    GFR calc Af Amer 9 (*)  All other components within normal limits  CBC - Abnormal; Notable for the following:    Hemoglobin 11.9 (*)    All other components within normal limits  URINALYSIS, ROUTINE W REFLEX MICROSCOPIC (NOT AT [facility]) - Abnormal; Notable for the following:    Glucose, UA >1000 (*)    Hgb urine dipstick TRACE (*)    Protein, ur >300 (*)    All other components within normal limits  URINE MICROSCOPIC-ADD ON - Abnormal; Notable for the following:    Squamous Epithelial / LPF 6-30 (*)    Bacteria, UA FEW (*)    Casts HYALINE CASTS (*)    All other components within normal limits  LIPASE, BLOOD    EKG  EKG Interpretation None       Radiology Ct Abdomen Pelvis Wo Contrast  Result Date: 09/14/2016 CLINICAL DATA:  Acute onset of sharp intermittent periumbilical abdominal pain and tenderness. Initial encounter. EXAM: CT ABDOMEN AND PELVIS WITHOUT CONTRAST TECHNIQUE: Multidetector CT imaging of the abdomen and pelvis was performed following the standard protocol without IV contrast. COMPARISON:  Abdominal radiograph performed 07/09/2016, and MRI of the abdomen performed 11/23/2013 FINDINGS: Lower chest: Scattered coronary artery calcifications are seen. Minimal left basilar atelectasis or scarring is noted. Hepatobiliary: The liver is unremarkable in appearance. The patient is status post cholecystectomy, with clips noted at the gallbladder fossa. The common bile duct remains  normal in caliber. Pancreas: The pancreas is within normal limits. Spleen: The spleen is unremarkable in appearance. Adrenals/Urinary Tract: The adrenal glands are grossly unremarkable in appearance. Moderate bilateral renal atrophy is noted. Mild nonspecific perinephric stranding is noted bilaterally. There is no evidence of hydronephrosis. No renal or ureteral stones are identified. Stomach/Bowel: The stomach is unremarkable in appearance. The small bowel is within normal limits. The appendix is normal in caliber, without evidence of appendicitis. The colon is unremarkable in appearance. Vascular/Lymphatic: Minimal scattered calcification is seen along the abdominal aorta. The abdominal aorta is otherwise grossly unremarkable. The inferior vena cava is grossly unremarkable. No retroperitoneal lymphadenopathy is seen; scattered periaortic nodes remain borderline normal in size. No pelvic sidewall lymphadenopathy is identified. Reproductive: The bladder is mildly distended and grossly unremarkable. The patient is status post hysterectomy. No suspicious adnexal masses are seen. The ovaries are relatively symmetric. Other: Mild skin thickening is suggested about the umbilicus, raising question for mild cellulitis. Musculoskeletal: No acute osseous abnormalities are identified. The visualized musculature is unremarkable in appearance. IMPRESSION: 1. Mild skin thickening suggested about the umbilicus, raising question for mild focal cellulitis given the patient's symptoms. 2. Moderate bilateral renal atrophy noted. 3. Scattered coronary artery calcifications seen. Electronically Signed   By: Garald Balding M.D.   On: 09/14/2016 21:24    Procedures Procedures (including critical care time)  Medications Ordered in ED Medications  iopamidol (ISOVUE-300) 61 % injection (15 mLs  Contrast Given 09/14/16 1901)     Initial Impression / Assessment and Plan / ED Course  I have reviewed the triage vital signs and the  nursing notes.  Pertinent labs & imaging results that were available during my care of the patient were reviewed by me and considered in my medical decision making (see chart for details).  Clinical Course  Value Comment By Time  Bacteria, UA: (!) FEW (Reviewed) Daleen Bo, MD 09/29 2103   Urinalysis with elevated white count, however, moderate squamous cells and few bacteria. Urine culture ordered Daleen Bo, MD 09/29 2106  WBC: 8.5 Normal Daleen Bo, MD 09/29 2106  Creatinine: (!) 5.85 Consistent with end-stage renal disease Daleen Bo, MD 09/29 2107  Casts: (!) HYALINE CASTS (Reviewed) Daleen Bo, MD 09/29 2142    Medications  iopamidol (ISOVUE-300) 61 % injection (15 mLs  Contrast Given 09/14/16 1901)    Patient Vitals for the past 24 hrs:  BP Temp Temp src Pulse Resp SpO2  09/14/16 2051 169/76 99.2 F (37.3 C) Oral 79 16 99 %  09/14/16 1903 146/78 - - 78 17 100 %  09/14/16 1630 179/80 98.7 F (37.1 C) Oral 83 16 98 %    9:06 PM Reevaluation with update and discussion. After initial assessment and treatment, an updated evaluation reveals No additional complaints. Findings discussed with patient and all questions were answered. Kilea Mccarey L    Final Clinical Impressions(s) / ED Diagnoses   Final diagnoses:  Cellulitis, abdominal wall    Abdominal wall tenderness centered on the umbilicus, with mildly abnormal CT scan. Doubt extensive cellulitis, acute intra-abdominal process or, complication of end-stage renal disease.  Nursing Notes Reviewed/ Care Coordinated Applicable Imaging Reviewed Interpretation of Laboratory Data incorporated into ED treatment  The patient appears reasonably screened and/or stabilized for discharge and I doubt any other medical condition or other [facility] requiring further screening, evaluation, or treatment in the ED at this time prior to discharge.  Plan: Home Medications- continue; Home Treatments- rest; return here if the  recommended treatment, does not improve the symptoms; Recommended follow up- PCP prn  New Prescriptions New Prescriptions   CEPHALEXIN (KEFLEX) 250 MG CAPSULE    Take 1 capsule (250 mg total) by mouth 4 (four) times daily.     Daleen Bo, MD 09/14/16 2158

## 2016-09-14 NOTE — Telephone Encounter (Signed)
Patient called in stating she is having a lot of abd pain and has a knot near her belly button.  She denies having any rectal bleeding, nausea & vomiting or fevers.  She is also scheduled for a colonoscopy with you on 10/12.  She can be reached at [phone].   Routing to Dr. Gala Romney for recommendations

## 2016-09-14 NOTE — ED Triage Notes (Signed)
Pt reports pain that started yesterday around her umbilicus. Pain worse with straining to go the bathroom, tender to palpation. Pt reports hx of problems with constipation.

## 2016-09-17 ENCOUNTER — Encounter (HOSPITAL_COMMUNITY): Payer: Self-pay | Admitting: Emergency Medicine

## 2016-09-17 ENCOUNTER — Emergency Department (HOSPITAL_COMMUNITY): Payer: PRIVATE HEALTH INSURANCE

## 2016-09-17 ENCOUNTER — Telehealth: Payer: Self-pay | Admitting: Internal Medicine

## 2016-09-17 ENCOUNTER — Emergency Department (HOSPITAL_COMMUNITY)
Admission: EM | Admit: 2016-09-17 | Discharge: 2016-09-17 | Disposition: A | Discharge status: Home / Self Care | Payer: PRIVATE HEALTH INSURANCE | Attending: Emergency Medicine | Admitting: Emergency Medicine

## 2016-09-17 DIAGNOSIS — R0789 Other chest pain: Secondary | ICD-10-CM | POA: Insufficient documentation

## 2016-09-17 DIAGNOSIS — Z7984 Long term (current) use of oral hypoglycemic drugs: Secondary | ICD-10-CM | POA: Diagnosis not present

## 2016-09-17 DIAGNOSIS — I120 Hypertensive chronic kidney disease with stage 5 chronic kidney disease or end stage renal disease: Secondary | ICD-10-CM | POA: Diagnosis not present

## 2016-09-17 DIAGNOSIS — E119 Type 2 diabetes mellitus without complications: Secondary | ICD-10-CM | POA: Insufficient documentation

## 2016-09-17 DIAGNOSIS — R079 Chest pain, unspecified: Secondary | ICD-10-CM | POA: Diagnosis not present

## 2016-09-17 DIAGNOSIS — D631 Anemia in chronic kidney disease: Secondary | ICD-10-CM | POA: Diagnosis not present

## 2016-09-17 DIAGNOSIS — Z7982 Long term (current) use of aspirin: Secondary | ICD-10-CM | POA: Diagnosis not present

## 2016-09-17 DIAGNOSIS — N186 End stage renal disease: Secondary | ICD-10-CM | POA: Diagnosis not present

## 2016-09-17 DIAGNOSIS — Z79899 Other long term (current) drug therapy: Secondary | ICD-10-CM | POA: Insufficient documentation

## 2016-09-17 DIAGNOSIS — N2581 Secondary hyperparathyroidism of renal origin: Secondary | ICD-10-CM | POA: Diagnosis not present

## 2016-09-17 LAB — CBC
HCT: 35.8 % — ABNORMAL LOW (ref 36.0–46.0)
Hemoglobin: 11.7 g/dL — ABNORMAL LOW (ref 12.0–15.0)
MCH: 29.4 pg (ref 26.0–34.0)
MCHC: 32.7 g/dL (ref 30.0–36.0)
MCV: 89.9 fL (ref 78.0–100.0)
PLATELETS: 233 10*3/uL (ref 150–400)
RBC: 3.98 MIL/uL (ref 3.87–5.11)
RDW: 13.8 % (ref 11.5–15.5)
WBC: 10.4 10*3/uL (ref 4.0–10.5)

## 2016-09-17 LAB — BASIC METABOLIC PANEL
Anion gap: 11 (ref 5–15)
BUN: 35 mg/dL — ABNORMAL HIGH (ref 6–20)
CALCIUM: 8.8 mg/dL — AB (ref 8.9–10.3)
CO2: 27 mmol/L (ref 22–32)
CREATININE: 7.4 mg/dL — AB (ref 0.44–1.00)
Chloride: 98 mmol/L — ABNORMAL LOW (ref 101–111)
GFR calc non Af Amer: 6 mL/min — ABNORMAL LOW (ref >60)
GFR, EST AFRICAN AMERICAN: 7 mL/min — AB (ref >60)
Glucose, Bld: 148 mg/dL — ABNORMAL HIGH (ref 65–99)
Potassium: 4.1 mmol/L (ref 3.5–5.1)
SODIUM: 136 mmol/L (ref 135–145)

## 2016-09-17 LAB — HEPATIC FUNCTION PANEL
ALBUMIN: 4.3 g/dL (ref 3.5–5.0)
ALT: 20 U/L (ref 14–54)
AST: 20 U/L (ref 15–41)
Alkaline Phosphatase: 94 U/L (ref 38–126)
BILIRUBIN TOTAL: 0.7 mg/dL (ref 0.3–1.2)
Bilirubin, Direct: <0.1 mg/dL — ABNORMAL LOW (ref 0.1–0.5)
Total Protein: 8.3 g/dL — ABNORMAL HIGH (ref 6.5–8.1)

## 2016-09-17 LAB — TROPONIN I
TROPONIN I: 0.03 ng/mL — AB (ref <0.03)
TROPONIN I: 0.03 ng/mL — AB (ref <0.03)

## 2016-09-17 LAB — LIPASE, BLOOD: Lipase: 59 U/L — ABNORMAL HIGH (ref 11–51)

## 2016-09-17 MED ORDER — FAMOTIDINE 20 MG PO TABS
10.0000 mg | ORAL_TABLET | Freq: Once | ORAL | Status: AC
  Administered 2016-09-17: 10 mg via ORAL
  Filled 2016-09-17: qty 1

## 2016-09-17 MED ORDER — ACETAMINOPHEN 325 MG PO TABS
650.0000 mg | ORAL_TABLET | Freq: Once | ORAL | Status: AC
  Administered 2016-09-17: 650 mg via ORAL
  Filled 2016-09-17: qty 2

## 2016-09-17 MED ORDER — PROCHLORPERAZINE EDISYLATE 5 MG/ML IJ SOLN
10.0000 mg | Freq: Once | INTRAMUSCULAR | Status: AC
  Administered 2016-09-17: 10 mg via INTRAMUSCULAR
  Filled 2016-09-17: qty 2

## 2016-09-17 MED ORDER — SUCRALFATE 1 GM/10ML PO SUSP: 1.0000 g | Freq: Three times a day (TID) | ORAL | 0 refills | Status: DC

## 2016-09-17 MED ORDER — GI COCKTAIL ~~LOC~~
30.0000 mL | Freq: Once | ORAL | Status: AC
  Administered 2016-09-17: 30 mL via ORAL
  Filled 2016-09-17: qty 30

## 2016-09-17 NOTE — Telephone Encounter (Signed)
Spoke with the pt, she is in the ED- she will call back if she continues to have problems.

## 2016-09-17 NOTE — ED Triage Notes (Signed)
Pt reports L sided chest pain that started this am during dialysis.

## 2016-09-17 NOTE — ED Notes (Signed)
Pt having intermittent chronic leg cramps

## 2016-09-17 NOTE — Telephone Encounter (Signed)
PATIENT CALLED WITH QUESTIONS ABOUT GAS PAINS THAT ARE UNBEARABLE.  MEDICINE FOR REFLUX IS NOT WORKING.  [phone]

## 2016-09-17 NOTE — Telephone Encounter (Signed)
Tried to call pt- NA- LMOM 

## 2016-09-17 NOTE — ED Notes (Signed)
Pt complaining that her head is hurting and her chest is hurting again. Dr Dolly Rias notified and states he will be in to talk to pt

## 2016-09-17 NOTE — ED Provider Notes (Signed)
[facility] DEPT Provider Note   CSN: [id number] Arrival date & time: 09/17/16  1234     History   Chief Complaint Chief Complaint  Patient presents with  . Chest Pain    HPI Patricia [name] is a 45 y.o. female.  Says she has CP however it is really intermittent upper bilateral abdominal pain similar to previous episodes of reflux. Started while on dialysis. No SOB, diaphoresis or dizziness. Does have nausea. Exactly the same as pain she had when admitted last time. Usually gets better with Tums, but not today. No other modifying factors.       Past Medical History:  Diagnosis Date  . Blood transfusion without reported diagnosis   . Cancer ([place])   . Diabetes mellitus without complication ([place])   . Dialysis patient ([place])    mon, wed, friday,   . Hypertension   . Renal disorder   . Renal insufficiency     Patient Active Problem List   Diagnosis Date Noted  . GERD (gastroesophageal reflux disease) 08/02/2016  . Pyloric stenosis, acquired   . Nausea with vomiting   . Generalized abdominal pain   . Chest pain, rule out acute myocardial infarction 07/04/2016  . Constipation 07/04/2016  . Nausea 07/04/2016  . Essential hypertension 07/04/2016  . HCAP (healthcare-associated pneumonia) 12/04/2015  . ESRD on dialysis ([place]) 12/04/2015  . Diabetes mellitus ([place]) 12/04/2015    Past Surgical History:  Procedure Laterality Date  . ABDOMINAL HYSTERECTOMY    . AV FISTULA PLACEMENT  11/2014   at [facility] N/A 07/10/2016   Procedure: BALLOON DILATION;  Surgeon: Danie Binder, MD;  Location: [facility];  Service: Endoscopy;  Laterality: N/A;  Pyloric dilation  . CESAREAN SECTION    . CHOLECYSTECTOMY    . DILATION AND CURETTAGE OF UTERUS    . ESOPHAGOGASTRODUODENOSCOPY N/A 07/10/2016   Dr.Fields- normal esophagus, gastric stenosis was found at the pylorus, gastritis on bx, normal examined duodenun    OB History    Gravida Para Term Preterm AB  Living   '3 2 1 1 1     '$ SAB TAB Ectopic Multiple Live Births   1               Home Medications    Prior to Admission medications   Medication Sig Start Date End Date Taking? Authorizing Provider  aspirin 81 MG tablet Take 81 mg by mouth daily.   Yes Historical Provider, MD  cephALEXin (KEFLEX) 250 MG capsule Take 1 capsule (250 mg total) by mouth 4 (four) times daily. 09/14/16  Yes Daleen Bo, MD  dexlansoprazole (DEXILANT) 60 MG capsule Take 60 mg by mouth every morning.   Yes Historical Provider, MD  Ferric Citrate (AURYXIA) 1 GM 210 MG(Fe) TABS Take 2 tablets by mouth 2 (two) times daily with a meal.   Yes Historical Provider, MD  furosemide (LASIX) 40 MG tablet Take 40-80 mg by mouth 2 (two) times daily.    Yes Historical Provider, MD  glipiZIDE (GLUCOTROL) 5 MG tablet Take 5 mg by mouth 2 (two) times daily as needed. For blood sugar levels over 150   Yes Historical Provider, MD  labetalol (NORMODYNE) 300 MG tablet Take 1 tablet (300 mg total) by mouth 2 (two) times daily. 12/06/15  Yes Erline Hau, MD  lubiprostone [facility]) 24 MCG capsule Take 1 capsule (24 mcg total) by mouth 2 (two) times daily with a meal. 08/02/16  Yes Carlis Stable,  NP  multivitamin (RENA-VIT) TABS tablet Take 1 tablet by mouth daily.   Yes Historical Provider, MD  rOPINIRole (REQUIP XL) 2 MG 24 hr tablet Take 2 mg by mouth at bedtime.  09/10/16  Yes Historical Provider, MD  SENSIPAR 30 MG tablet Take 30 mg by mouth daily with supper.  09/12/16  Yes Historical Provider, MD  sevelamer carbonate (RENVELA) 800 MG tablet Take 800 mg by mouth 2 (two) times daily. Takes 1 tab with snack   Yes Historical Provider, MD  lidocaine (XYLOCAINE) 2 % solution 2 TSP PO Q4H AND AT BEDTIME PRN FOR ABDOMINAL/CHEST PAIN. REPEAT DOSE Q4H. NO MORE> 8 DOSES/DAY. Patient not taking: Reported on 09/14/2016 07/11/16   Danie Binder, MD  NIFEdipine (PROCARDIA-XL/ADALAT CC) 60 MG 24 hr tablet Take 1 tablet (60 mg total) by mouth  daily. 12/06/15   Erline Hau, MD  polyethylene glycol-electrolytes (TRILYTE) 420 g solution Take 4,000 mLs by mouth as directed. 09/11/16   Daneil Dolin, MD  polyethylene glycol-electrolytes (TRILYTE) 420 g solution Take 4,000 mLs by mouth as directed. Patient not taking: Reported on 09/14/2016 09/11/16   Daneil Dolin, MD  sucralfate (CARAFATE) 1 GM/10ML suspension Take 10 mLs (1 g total) by mouth 4 (four) times daily -  with meals and at bedtime. 09/17/16   Merrily Pew, MD    Family History Family History  Problem Relation Age of Onset  . Colon cancer Neg Hx     Social History Social History  Substance Use Topics  . Smoking status: Never Smoker  . Smokeless tobacco: Never Used  . Alcohol use No     Allergies   Reglan [metoclopramide] and Amlodipine besylate   Review of Systems Review of Systems  Gastrointestinal: Positive for abdominal pain.  All other systems reviewed and are negative.    Physical Exam Updated Vital Signs BP 162/91 (BP Location: Right Arm)   Pulse 82   Temp 98.5 F (36.9 C) (Oral)   Resp (!) 31   Ht '5\' 3"'$  (1.6 m)   Wt 240 lb (108.9 kg)   SpO2 96%   BMI 42.51 kg/m   Physical Exam  Constitutional: She is oriented to person, place, and time. She appears well-developed and well-nourished. No distress.  HENT:  Head: Normocephalic and atraumatic.  Eyes: Conjunctivae are normal.  Neck: Neck supple.  Cardiovascular: Normal rate and regular rhythm.  Exam reveals no friction rub.   No murmur heard. Pulmonary/Chest: Effort normal and breath sounds normal. No respiratory distress. She has no wheezes.  Abdominal: Soft. She exhibits no distension and no mass. There is no tenderness. There is no rebound and no guarding.  Musculoskeletal: She exhibits no edema.  Neurological: She is alert and oriented to person, place, and time.  Skin: Skin is warm and dry.  Psychiatric: She has a normal mood and affect.  Nursing note and vitals  reviewed.    ED Treatments / Results  Labs (all labs ordered are listed, but only abnormal results are displayed) Labs Reviewed  BASIC METABOLIC PANEL - Abnormal; Notable for the following:       Result Value   Chloride 98 (*)    Glucose, Bld 148 (*)    BUN 35 (*)    Creatinine, Ser 7.40 (*)    Calcium 8.8 (*)    GFR calc non Af Amer 6 (*)    GFR calc Af Amer 7 (*)    All other components within normal limits  CBC -  Abnormal; Notable for the following:    Hemoglobin 11.7 (*)    HCT 35.8 (*)    All other components within normal limits  TROPONIN I - Abnormal; Notable for the following:    Troponin I 0.03 (*)    All other components within normal limits  LIPASE, BLOOD - Abnormal; Notable for the following:    Lipase 59 (*)    All other components within normal limits  HEPATIC FUNCTION PANEL - Abnormal; Notable for the following:    Total Protein 8.3 (*)    Bilirubin, Direct <0.1 (*)    All other components within normal limits  TROPONIN I - Abnormal; Notable for the following:    Troponin I 0.03 (*)    All other components within normal limits    EKG  EKG Interpretation  Date/Time:  Monday September 17 2016 17:23:33 EDT Ventricular Rate:  80 PR Interval:  182 QRS Duration: 90 QT Interval:  412 QTC Calculation: 476 R Axis:   93 Text Interpretation:  Sinus rhythm Consider left atrial enlargement Borderline right axis deviation Consider left ventricular hypertrophy no change since earlier Confirmed by [facility] MD, Becki Mccaskill [phone]) on 09/17/2016 6:08:01 PM       Radiology Dg Chest 2 View  Result Date: 09/17/2016 CLINICAL DATA:  Chest pain EXAM: CHEST  2 VIEW COMPARISON:  07/04/2016 chest radiograph. FINDINGS: Stable cardiomediastinal silhouette with twelve normal heart size. No pneumothorax. No pleural effusion. No acute consolidative airspace disease. Cephalization of the pulmonary vasculature without overt pulmonary edema. IMPRESSION: Cephalization of the pulmonary  vasculature without overt pulmonary edema. No acute consolidative airspace disease. Electronically Signed   By: Ilona Sorrel M.D.   On: 09/17/2016 13:48    Procedures Procedures (including critical care time)  Medications Ordered in ED Medications  gi cocktail (Maalox,Lidocaine,Donnatal) (30 mLs Oral Given 09/17/16 1522)  famotidine (PEPCID) tablet 10 mg (10 mg Oral Given 09/17/16 1522)  acetaminophen (TYLENOL) tablet 650 mg (650 mg Oral Given 09/17/16 1742)  prochlorperazine (COMPAZINE) injection 10 mg (10 mg Intramuscular Given 09/17/16 1741)     Initial Impression / Assessment and Plan / ED Course  I have reviewed the triage vital signs and the nursing notes.  Pertinent labs & imaging results that were available during my care of the patient were reviewed by me and considered in my medical decision making (see chart for details).  Clinical Course   Likely GI related. Troponin of 0.03, but this is similar to multiple other troponins in the past. Likely baseline and related to CKD and ESRD however will trend to ensure no rise. GI cocktail in mean time.   Chest pain likely from reflux, will trial carafate with permission from nephrologist. Serial troponins without change. ECG's without change. Doubt ACS. Doubt PE, PERC negative. Doubt dissection. Patient sleeping on multiple occasions while in ED and pain free at time of discharge.   Final Clinical Impressions(s) / ED Diagnoses   Final diagnoses:  Atypical chest pain    New Prescriptions Discharge Medication List as of 09/17/2016  6:30 PM    START taking these medications   Details  sucralfate (CARAFATE) 1 GM/10ML suspension Take 10 mLs (1 g total) by mouth 4 (four) times daily -  with meals and at bedtime., Starting Mon 09/17/2016, Print         Merrily Pew, MD 09/17/16 1932

## 2016-09-17 NOTE — Progress Notes (Signed)
   HEMODIALYSIS NURSING NOTE:  According to charge nurse at pt's dialysis center, pt signed of off HD treatment 30 minutes early due to c/o chest pain.  Pt normally dialyzes 4 hours and 15 minutes.  Rockwell Alexandria, RN, CDN

## 2016-09-17 NOTE — Telephone Encounter (Signed)
Spoke with the pt- she is currently in the ED. She will call us back if she continues to have problems.

## 2016-09-25 ENCOUNTER — Telehealth: Payer: Self-pay | Admitting: Internal Medicine

## 2016-09-25 ENCOUNTER — Encounter (HOSPITAL_COMMUNITY)
Admission: RE | Admit: 2016-09-25 | Discharge: 2016-09-25 | Disposition: A | Discharge status: Home / Self Care | Payer: Medicare Other | Source: Ambulatory Visit | Attending: Internal Medicine | Admitting: Internal Medicine

## 2016-09-25 NOTE — Telephone Encounter (Signed)
Kim Faint called to say that patient did not come to her pre op today. She is scheduled with RMR on Thursday 10/12. Maudie Mercury said she had called but the husband said the patient was asleep and had to work Midwife.

## 2016-09-26 ENCOUNTER — Encounter (HOSPITAL_COMMUNITY): Payer: Self-pay

## 2016-09-26 NOTE — Telephone Encounter (Signed)
Per Maudie Mercury in Endo: Pt will go to Short Stay tomorrow at 12:00 before her TCS. She is aware

## 2016-09-26 NOTE — Telephone Encounter (Signed)
Noted  

## 2016-09-27 ENCOUNTER — Ambulatory Visit (HOSPITAL_COMMUNITY): Payer: PRIVATE HEALTH INSURANCE | Admitting: Anesthesiology

## 2016-09-27 ENCOUNTER — Ambulatory Visit (HOSPITAL_COMMUNITY)
Admission: RE | Admit: 2016-09-27 | Discharge: 2016-09-27 | Disposition: A | Discharge status: Home / Self Care | Payer: PRIVATE HEALTH INSURANCE | Source: Ambulatory Visit | Attending: Internal Medicine | Admitting: Internal Medicine

## 2016-09-27 ENCOUNTER — Encounter (HOSPITAL_COMMUNITY)
Admission: RE | Disposition: A | Discharge status: Home / Self Care | Payer: Self-pay | Source: Ambulatory Visit | Attending: Internal Medicine

## 2016-09-27 ENCOUNTER — Encounter (HOSPITAL_COMMUNITY): Payer: Self-pay

## 2016-09-27 DIAGNOSIS — Z992 Dependence on renal dialysis: Secondary | ICD-10-CM | POA: Insufficient documentation

## 2016-09-27 DIAGNOSIS — Z1212 Encounter for screening for malignant neoplasm of rectum: Secondary | ICD-10-CM | POA: Diagnosis not present

## 2016-09-27 DIAGNOSIS — Z1211 Encounter for screening for malignant neoplasm of colon: Secondary | ICD-10-CM | POA: Diagnosis not present

## 2016-09-27 DIAGNOSIS — G8929 Other chronic pain: Secondary | ICD-10-CM | POA: Diagnosis not present

## 2016-09-27 DIAGNOSIS — K6289 Other specified diseases of anus and rectum: Secondary | ICD-10-CM | POA: Diagnosis not present

## 2016-09-27 DIAGNOSIS — Z79899 Other long term (current) drug therapy: Secondary | ICD-10-CM | POA: Diagnosis not present

## 2016-09-27 DIAGNOSIS — K219 Gastro-esophageal reflux disease without esophagitis: Secondary | ICD-10-CM | POA: Diagnosis not present

## 2016-09-27 DIAGNOSIS — E1122 Type 2 diabetes mellitus with diabetic chronic kidney disease: Secondary | ICD-10-CM | POA: Diagnosis not present

## 2016-09-27 DIAGNOSIS — I120 Hypertensive chronic kidney disease with stage 5 chronic kidney disease or end stage renal disease: Secondary | ICD-10-CM | POA: Diagnosis not present

## 2016-09-27 DIAGNOSIS — Z7982 Long term (current) use of aspirin: Secondary | ICD-10-CM | POA: Insufficient documentation

## 2016-09-27 DIAGNOSIS — Z7984 Long term (current) use of oral hypoglycemic drugs: Secondary | ICD-10-CM | POA: Diagnosis not present

## 2016-09-27 DIAGNOSIS — N186 End stage renal disease: Secondary | ICD-10-CM | POA: Diagnosis not present

## 2016-09-27 DIAGNOSIS — K648 Other hemorrhoids: Secondary | ICD-10-CM | POA: Insufficient documentation

## 2016-09-27 HISTORY — PX: COLONOSCOPY WITH PROPOFOL: SHX5780

## 2016-09-27 LAB — TSH: TSH: 0.867 u[IU]/mL (ref 0.350–4.500)

## 2016-09-27 SURGERY — COLONOSCOPY WITH PROPOFOL
Anesthesia: Monitor Anesthesia Care

## 2016-09-27 MED ORDER — ONDANSETRON HCL 4 MG/2ML IJ SOLN
4.0000 mg | Freq: Once | INTRAMUSCULAR | Status: AC
  Administered 2016-09-27: 4 mg via INTRAVENOUS

## 2016-09-27 MED ORDER — HYDROMORPHONE HCL 1 MG/ML IJ SOLN: 0.2500 mg | INTRAMUSCULAR | Status: DC | PRN

## 2016-09-27 MED ORDER — SODIUM CHLORIDE 0.9 % IV SOLN
INTRAVENOUS | Status: DC
  Administered 2016-09-27: 14:00:00 via INTRAVENOUS

## 2016-09-27 MED ORDER — MIDAZOLAM HCL 5 MG/5ML IJ SOLN
INTRAMUSCULAR | Status: DC | PRN
  Administered 2016-09-27 (×2): 1 mg via INTRAVENOUS

## 2016-09-27 MED ORDER — PROPOFOL 500 MG/50ML IV EMUL
INTRAVENOUS | Status: DC | PRN
  Administered 2016-09-27: 50 ug/kg/min via INTRAVENOUS
  Administered 2016-09-27: 14:00:00 via INTRAVENOUS

## 2016-09-27 MED ORDER — MIDAZOLAM HCL 2 MG/2ML IJ SOLN
INTRAMUSCULAR | Status: AC
  Filled 2016-09-27: qty 2

## 2016-09-27 MED ORDER — FENTANYL CITRATE (PF) 100 MCG/2ML IJ SOLN
INTRAMUSCULAR | Status: AC
  Filled 2016-09-27: qty 2

## 2016-09-27 MED ORDER — LACTATED RINGERS IV SOLN: INTRAVENOUS | Status: DC | PRN

## 2016-09-27 MED ORDER — CHLORHEXIDINE GLUCONATE CLOTH 2 % EX PADS: 6.0000 | MEDICATED_PAD | Freq: Once | CUTANEOUS | Status: DC

## 2016-09-27 MED ORDER — MIDAZOLAM HCL 2 MG/2ML IJ SOLN
1.0000 mg | INTRAMUSCULAR | Status: DC | PRN
Start: 2016-09-27 — End: 2016-09-27
  Administered 2016-09-27: 2 mg via INTRAVENOUS

## 2016-09-27 MED ORDER — FENTANYL CITRATE (PF) 100 MCG/2ML IJ SOLN
25.0000 ug | INTRAMUSCULAR | Status: DC | PRN
  Administered 2016-09-27: 25 ug via INTRAVENOUS

## 2016-09-27 MED ORDER — LACTATED RINGERS IV SOLN: INTRAVENOUS | Status: DC

## 2016-09-27 MED ORDER — ONDANSETRON HCL 4 MG/2ML IJ SOLN
INTRAMUSCULAR | Status: AC
  Filled 2016-09-27: qty 2

## 2016-09-27 NOTE — H&P (View-Only) (Signed)
Primary Care Physician:  [name] Primary Gastroenterologist:  Dr. Gala Romney Pre-Procedure History & Physical: HPI:  Patricia [name] is a 45 y.o. female here for followup of GERD and obstipation. Hospitalized with abdominal pain. EGD essentially negative. Chronically constipated or loose constipated over the past several months since going on dialysis. Has abdominal pain during dialysis. Despite taking Protonix twice a day, no improvement in her dyspepsia GERD symptoms. She tells me that comes gives her nearly instantaneous relief in her symptoms however, she's been advised not to take them because of her renal failure. No prior colonoscopy. She is 44. Amitiza is tolerated well but does not produce a good bowel movement daily. Feels she is "backed up". No opioids. Has taken MiraLax the past but doesn't take currently.  Past Medical History:  Diagnosis Date  . Blood transfusion without reported diagnosis   . Cancer ([place])   . Diabetes mellitus without complication ([place])   . Dialysis patient ([place])    mon, wed, friday,   . Hypertension   . Renal disorder   . Renal insufficiency     Past Surgical History:  Procedure Laterality Date  . ABDOMINAL HYSTERECTOMY    . AV FISTULA PLACEMENT  11/2014   at [place] N/A 07/10/2016   Procedure: BALLOON DILATION;  Surgeon: Danie Binder, MD;  Location: [facility];  Service: Endoscopy;  Laterality: N/A;  Pyloric dilation  . CESAREAN SECTION    . CHOLECYSTECTOMY    . DILATION AND CURETTAGE OF UTERUS    . ESOPHAGOGASTRODUODENOSCOPY N/A 07/10/2016   Dr.Fields- normal esophagus, gastric stenosis was found at the pylorus, gastritis on bx, normal examined duodenun    Prior to Admission medications   Medication Sig Start Date End Date Taking? Authorizing Provider  aspirin 81 MG tablet Take 81 mg by mouth daily.   Yes Historical Provider, MD  Ferric Citrate 1 GM 210 MG(Fe) TABS Take 1 tablet by mouth 2 (two) times daily.  Takes 1 tab with meal   Yes Historical Provider, MD  furosemide (LASIX) 40 MG tablet Take 40-80 mg by mouth 2 (two) times daily.    Yes Historical Provider, MD  glipiZIDE (GLUCOTROL) 5 MG tablet Take 5 mg by mouth 2 (two) times daily before a meal.   Yes Historical Provider, MD  labetalol (NORMODYNE) 300 MG tablet Take 1 tablet (300 mg total) by mouth 2 (two) times daily. 12/06/15  Yes Erline Hau, MD  lidocaine (XYLOCAINE) 2 % solution 2 TSP PO Q4H AND AT BEDTIME PRN FOR ABDOMINAL/CHEST PAIN. REPEAT DOSE Q4H. NO MORE> 8 DOSES/DAY. 07/11/16  Yes Danie Binder, MD  lubiprostone (AMITIZA) 24 MCG capsule Take 1 capsule (24 mcg total) by mouth 2 (two) times daily with a meal. 08/02/16  Yes Carlis Stable, NP  multivitamin (RENA-VIT) TABS tablet Take 1 tablet by mouth daily.   Yes Historical Provider, MD  NIFEdipine (PROCARDIA-XL/ADALAT CC) 60 MG 24 hr tablet Take 1 tablet (60 mg total) by mouth daily. 12/06/15  Yes Erline Hau, MD  pantoprazole (PROTONIX) 20 MG tablet Take 1 tablet by mouth 2 (two) times daily. 07/18/16  Yes Historical Provider, MD  rOPINIRole (REQUIP) 2 MG tablet Take 2 mg by mouth at bedtime.   Yes Historical Provider, MD  sevelamer carbonate (RENVELA) 800 MG tablet Take 800 mg by mouth 2 (two) times daily. Takes 1 tab with snack   Yes Historical Provider, MD    Allergies as of  09/11/2016 - Review Complete 09/11/2016  Allergen Reaction Noted  . Reglan [metoclopramide] Other (See Comments) 12/04/2015  . Amlodipine besylate Rash and Other (See Comments) 12/04/2015    Family History  Problem Relation Age of Onset  . Colon cancer Neg Hx     Social History   Social History  . Marital status: Married    Spouse name: N/A  . Number of children: N/A  . Years of education: N/A   Occupational History  . Not on file.   Social History Main Topics  . Smoking status: Never Smoker  . Smokeless tobacco: Never Used  . Alcohol use No  . Drug use: No  . Sexual  activity: Not on file   Other Topics Concern  . Not on file   Social History Narrative  . No narrative on file    Review of Systems: See HPI, otherwise negative ROS  Physical Exam: BP (!) 182/83   Pulse 77   Temp 98.1 F (36.7 C) (Oral)   Ht '5\' 3"'$  (1.6 m)   Wt 240 lb (108.9 kg)   BMI 42.51 kg/m  General:   Alert,   pleasant and cooperative in NAD Skin:  Intact without significant lesions or rashes. Neck:  Supple; no masses or thyromegaly. No significant cervical adenopathy. Lungs:  Clear throughout to auscultation.   No wheezes, crackles, or rhonchi. No acute distress. Heart:  Regular rate and rhythm; no murmurs, clicks, rubs,  or gallops. Abdomen: obese. Positive bowel sounds. Soft nontender without obvious mass or organomegaly Pulses:  Normal pulses noted. Extremities:  1+ LE edema   Impression:  45 year old lady with multiple medical problems chronic including chronic renal failure on hemodialysis. Chronic abdominal pain/obstipation/constipation. Dyspepsia and GERD. Protonix twice a day not really helping. Amitiza not effective in adequately evacuating her colon. Lenses did not work. Relieved with calcium containing antacids is interesting. According to [employer] gastrology she should have her first colonoscopy at age 61.  Recommendations:  Schedule a screening colonoscopy - Propofol.  The risks, benefits, limitations, alternatives and imponderables have been reviewed with the patient. Questions have been answered. All parties are agreeable.   One extra day of clear liquids; Double prep  For now; stop Protonix ; try Dexilant 60 mg daily - samples  Continue Amitiza twice daily  Add one dose of Miralax at bedtime  Serum TSH if not done recently.  Further recommendations to follow     Notice: This dictation was prepared with Dragon dictation along with smaller phrase technology. Any transcriptional errors that result from this process are unintentional and  may not be corrected upon review.

## 2016-09-27 NOTE — Anesthesia Postprocedure Evaluation (Signed)
Anesthesia Post Note  Patient: Patricia [name]  Procedure(s) Performed: Procedure(s) (LRB): COLONOSCOPY WITH PROPOFOL (N/A)  Patient location during evaluation: PACU Anesthesia Type: MAC Level of consciousness: awake and alert and oriented Pain management: pain level controlled Vital Signs Assessment: post-procedure vital signs reviewed and stable Respiratory status: spontaneous breathing Cardiovascular status: stable Postop Assessment: no signs of nausea or vomiting Anesthetic complications: no    Last Vitals:  Vitals:   09/27/16 1257 09/27/16 1415  Pulse: 77 (P) 74  Resp: (!) 22 (!) (P) 24  Temp: 36.6 C (P) 36.7 C    Last Pain:  Vitals:   09/27/16 1257  TempSrc: Oral                 ADAMS, AMY A

## 2016-09-27 NOTE — Discharge Instructions (Addendum)
Colonoscopy Discharge Instructions  Read the instructions outlined below and refer to this sheet in the next few weeks. These discharge instructions provide you with general information on caring for yourself after you leave the hospital. Your doctor may also give you specific instructions. While your treatment has been planned according to the most current medical practices available, unavoidable complications occasionally occur. If you have any problems or questions after discharge, call Dr. Gala Romney at [phone]. ACTIVITY  You may resume your regular activity, but move at a slower pace for the next 24 hours.   Take frequent rest periods for the next 24 hours.   Walking will help get rid of the air and reduce the bloated feeling in your belly (abdomen).   No driving for 24 hours (because of the medicine (anesthesia) used during the test).    Do not sign any important legal documents or operate any machinery for 24 hours (because of the anesthesia used during the test).  NUTRITION  Drink plenty of fluids.   You may resume your normal diet as instructed by your doctor.   Begin with a light meal and progress to your normal diet. Heavy or fried foods are harder to digest and may make you feel sick to your stomach (nauseated).   Avoid alcoholic beverages for 24 hours or as instructed.  MEDICATIONS  You may resume your normal medications unless your doctor tells you otherwise.  WHAT YOU CAN EXPECT TODAY  Some feelings of bloating in the abdomen.   Passage of more gas than usual.   Spotting of blood in your stool or on the toilet paper.  IF YOU HAD POLYPS REMOVED DURING THE COLONOSCOPY:  No aspirin products for 7 days or as instructed.   No alcohol for 7 days or as instructed.   Eat a soft diet for the next 24 hours.  FINDING OUT THE RESULTS OF YOUR TEST Not all test results are available during your visit. If your test results are not back during the visit, make an appointment  with your caregiver to find out the results. Do not assume everything is normal if you have not heard from your caregiver or the medical facility. It is important for you to follow up on all of your test results.  SEEK IMMEDIATE MEDICAL ATTENTION IF:  You have more than a spotting of blood in your stool.   Your belly is swollen (abdominal distention).   You are nauseated or vomiting.   You have a temperature over 101.   You have abdominal pain or discomfort that is severe or gets worse throughout the day.     Constipation information provided  Continue Amitiza twice daily  Continue MiraLAX at bedtime as needed  Serum TSH today to check thyroid  Repeat screening colonoscopy in 10 years  Office visit with [place] in 4-6 weeks    PATIENT INSTRUCTIONS POST-ANESTHESIA  IMMEDIATELY FOLLOWING SURGERY:  Do not drive or operate machinery for the first twenty four hours after surgery.  Do not make any important decisions for twenty four hours after surgery or while taking narcotic pain medications or sedatives.  If you develop intractable nausea and vomiting or a severe headache please notify your doctor immediately.  FOLLOW-UP:  Please make an appointment with your surgeon as instructed. You do not need to follow up with anesthesia unless specifically instructed to do so.  WOUND CARE INSTRUCTIONS (if applicable):  Keep a dry clean dressing on the anesthesia/puncture wound site if there is drainage.  Once the wound has quit draining you may leave it open to air.  Generally you should leave the bandage intact for twenty four hours unless there is drainage.  If the epidural site drains for more than 36-48 hours please call the anesthesia department.  QUESTIONS?:  Please feel free to call your physician or the hospital operator if you have any questions, and they will be happy to assist you.       Constipation, Adult Constipation is when a person has fewer than three bowel movements a week,  has difficulty having a bowel movement, or has stools that are dry, hard, or larger than normal. As people grow older, constipation is more common. A low-fiber diet, not taking in enough fluids, and taking certain medicines may make constipation worse.  CAUSES   Certain medicines, such as antidepressants, pain medicine, iron supplements, antacids, and water pills.   Certain diseases, such as diabetes, irritable bowel syndrome (IBS), thyroid disease, or depression.   Not drinking enough water.   Not eating enough fiber-rich foods.   Stress or travel.   Lack of physical activity or exercise.   Ignoring the urge to have a bowel movement.   Using laxatives too much.  SIGNS AND SYMPTOMS   Having fewer than three bowel movements a week.   Straining to have a bowel movement.   Having stools that are hard, dry, or larger than normal.   Feeling full or bloated.   Pain in the lower abdomen.   Not feeling relief after having a bowel movement.  DIAGNOSIS  Your health care provider will take a medical history and perform a physical exam. Further testing may be done for severe constipation. Some tests may include:  A barium enema X-ray to examine your rectum, colon, and, sometimes, your small intestine.   A sigmoidoscopy to examine your lower colon.   A colonoscopy to examine your entire colon. TREATMENT  Treatment will depend on the severity of your constipation and what is causing it. Some dietary treatments include drinking more fluids and eating more fiber-rich foods. Lifestyle treatments may include regular exercise. If these diet and lifestyle recommendations do not help, your health care provider may recommend taking over-the-counter laxative medicines to help you have bowel movements. Prescription medicines may be prescribed if over-the-counter medicines do not work.  HOME CARE INSTRUCTIONS   Eat foods that have a lot of fiber, such as fruits, vegetables, whole  grains, and beans.  Limit foods high in fat and processed sugars, such as french fries, hamburgers, cookies, candies, and soda.   A fiber supplement may be added to your diet if you cannot get enough fiber from foods.   Drink enough fluids to keep your urine clear or pale yellow.   Exercise regularly or as directed by your health care provider.   Go to the restroom when you have the urge to go. Do not hold it.   Only take over-the-counter or prescription medicines as directed by your health care provider. Do not take other medicines for constipation without talking to your health care provider first.  [place] IF:   You have bright red blood in your stool.   Your constipation lasts for more than 4 days or gets worse.   You have abdominal or rectal pain.   You have thin, pencil-like stools.   You have unexplained weight loss. MAKE SURE YOU:   Understand these instructions.  Will watch your condition.  Will get help right away  if you are not doing well or get worse.   This information is not intended to replace advice given to you by your health care provider. Make sure you discuss any questions you have with your health care provider.   Document Released: 08/31/2004 Document Revised: 12/24/2014 Document Reviewed: 09/14/2013 Elsevier Interactive Patient Education [employer].

## 2016-09-27 NOTE — Addendum Note (Signed)
Addendum  created 09/27/16 1430 by Mickel Baas, CRNA   Anesthesia Intra Meds edited

## 2016-09-27 NOTE — Transfer of Care (Signed)
Immediate Anesthesia Transfer of Care Note  Patient: Patricia [name]  Procedure(s) Performed: Procedure(s) with comments: COLONOSCOPY WITH PROPOFOL (N/A) - 2:00  Patient Location: PACU  Anesthesia Type:MAC  Level of Consciousness: awake, alert , oriented and patient cooperative  Airway & Oxygen Therapy: Patient Spontanous Breathing and Patient connected to face mask oxygen  Post-op Assessment: Report given to RN and Post -op Vital signs reviewed and stable  Post vital signs: Reviewed and stable  Last Vitals:  Vitals:   09/27/16 1257  Pulse: 77  Resp: (!) 22  Temp: 36.6 C    Last Pain:  Vitals:   09/27/16 1257  TempSrc: Oral         Complications: No apparent anesthesia complications

## 2016-09-27 NOTE — Interval H&P Note (Signed)
History and Physical Interval Note:  09/27/2016 12:45 PM  Patricia [name]  has presented today for surgery, with the diagnosis of screening colonoscopy  The various methods of treatment have been discussed with the patient and family. After consideration of risks, benefits and other options for treatment, the patient has consented to  Procedure(s) with comments: COLONOSCOPY WITH PROPOFOL (N/A) - 2:00 as a surgical intervention .  The patient's history has been reviewed, patient examined, no change in status, stable for surgery.  I have reviewed the patient's chart and labs.  Questions were answered to the patient's satisfaction.     No change. Dexilant has not helped her symptoms; Colonoscopy per plan.  The risks, benefits, limitations, alternatives and imponderables have been reviewed with the patient. Questions have been answered. All parties are agreeable.   Manus Rudd

## 2016-09-27 NOTE — Anesthesia Preprocedure Evaluation (Signed)
Anesthesia Evaluation  Patient identified by MRN, date of birth, ID band Patient awake    Reviewed: Allergy & Precautions, NPO status , Patient's Chart, lab work & pertinent test results  Airway Mallampati: III  TM Distance: >3 FB     Dental  (+) Teeth Intact   Pulmonary pneumonia, resolved,    breath sounds clear to auscultation       Cardiovascular hypertension,  Rhythm:Regular Rate:Normal     Neuro/Psych    GI/Hepatic GERD  ,  Endo/Other  diabetes  Renal/GU ESRFRenal disease     Musculoskeletal   Abdominal   Peds  Hematology   Anesthesia Other Findings   Reproductive/Obstetrics                             Anesthesia Physical Anesthesia Plan  ASA: III  Anesthesia Plan: MAC   Post-op Pain Management:    Induction: Intravenous  Airway Management Planned: Simple Face Mask  Additional Equipment:   Intra-op Plan:   Post-operative Plan:   Informed Consent: I have reviewed the patients History and Physical, chart, labs and discussed the procedure including the risks, benefits and alternatives for the proposed anesthesia with the patient or authorized representative who has indicated his/her understanding and acceptance.     Plan Discussed with:   Anesthesia Plan Comments:         Anesthesia Quick Evaluation

## 2016-09-27 NOTE — Op Note (Signed)
[facility] Patient Name: Patricia [name] Procedure Date: 09/27/2016 1:50 PM MRN: [id number] Date of Birth: June 21, 1971 Attending MD: Norvel Richards , MD CSN: [id number] Age: 45 Admit Type: Outpatient Procedure:                Colonoscopy - screening Indications:              Screening for colorectal malignant neoplasm Providers:                Norvel Richards, MD, Lurline Del, RN, Charlyne Petrin, RN, Purcell Nails. [place], [employer] Referring MD:              Medicines:                Propofol per Anesthesia Complications:            No immediate complications. Estimated Blood Loss:     Estimated blood loss: none. Procedure:                Pre-Anesthesia Assessment:                           - Prior to the procedure, a History and Physical                            was performed, and patient medications and                            allergies were reviewed. The patient's tolerance of                            previous anesthesia was also reviewed. The risks                            and benefits of the procedure and the sedation                            options and risks were discussed with the patient.                            All questions were answered, and informed consent                            was obtained. Prior Anticoagulants: The patient has                            taken no previous anticoagulant or antiplatelet                            agents. ASA Grade Assessment: II - A patient with                            mild systemic disease. After reviewing the risks  and benefits, the patient was deemed in                            satisfactory condition to undergo the procedure.                           After obtaining informed consent, the colonoscope                            was passed under direct vision. Throughout the                            procedure, the patient's blood pressure, pulse,  and                            oxygen saturations were monitored continuously. The                            EC-[id number] ([id number]) scope was introduced through                            the anus and advanced to the the cecum, identified                            by appendiceal orifice and ileocecal valve. The                            colonoscopy was performed without difficulty. The                            patient tolerated the procedure well. The quality                            of the bowel preparation was adequate. The entire                            colon was well visualized. The ileocecal valve,                            appendiceal orifice, and rectum were photographed. Scope In: 1:58:38 PM Scope Out: 2:09:10 PM Scope Withdrawal Time: 0 hours 7 minutes 18 seconds  Total Procedure Duration: 0 hours 10 minutes 32 seconds  Findings:      Patient found to have internal hemorrhoids and anal papilla; otherwise       the rectal mucosa appeared normal.      The perianal and digital rectal examinations were normal.      The exam was otherwise without abnormality on direct and retroflexion       views.      No additional abnormalities were found on retroflexion. Impression:               - Normal colonoscopy.                           - No specimens collected. Moderate Sedation:  Moderate (conscious) sedation was personally administered by an       anesthesia professional. The following parameters were monitored: oxygen       saturation, heart rate, blood pressure, respiratory rate, EKG, adequacy       of pulmonary ventilation, and response to care. Total physician       intraservice time was 14 minutes. Recommendation:           - Patient has a contact number available for                            emergencies. The signs and symptoms of potential                            delayed complications were discussed with the                            patient. Return to normal  activities tomorrow.                            Written discharge instructions were provided to the                            patient.                           - Resume previous diet.                           - Continue present medications. Continue Amitiza                            twice a day. Continue MiraLAX at bedtime as needed                           Serum TSH today                           - Repeat colonoscopy in 10 years for screening                            purposes.                           - Return to GI clinic in 6 weeks. Procedure Code(s):        --- Professional ---                           [phone], Colonoscopy, flexible; diagnostic, including                            collection of specimen(s) by brushing or washing,                            when performed (separate procedure) Diagnosis Code(s):        --- Professional ---  Z12.11, Encounter for screening for malignant                            neoplasm of colon CPT copyright 2016 American Medical Association. All rights reserved. The codes documented in this report are preliminary and upon coder review may  be revised to meet current compliance requirements. Cristopher Estimable. Kennidy Lamke, MD Norvel Richards, MD 09/27/2016 2:18:14 PM This report has been signed electronically. Number of Addenda: 0

## 2016-09-28 LAB — POCT I-STAT, CHEM 8
BUN: 36 mg/dL — AB (ref 6–20)
CALCIUM ION: 0.97 mmol/L — AB (ref 1.15–1.40)
CREATININE: 9.2 mg/dL — AB (ref 0.44–1.00)
Chloride: 99 mmol/L — ABNORMAL LOW (ref 101–111)
Glucose, Bld: 137 mg/dL — ABNORMAL HIGH (ref 65–99)
HCT: 35 % — ABNORMAL LOW (ref 36.0–46.0)
Hemoglobin: 11.9 g/dL — ABNORMAL LOW (ref 12.0–15.0)
Potassium: 5.1 mmol/L (ref 3.5–5.1)
Sodium: 137 mmol/L (ref 135–145)
TCO2: 29 mmol/L (ref 0–100)

## 2016-10-01 DIAGNOSIS — Z992 Dependence on renal dialysis: Secondary | ICD-10-CM | POA: Diagnosis not present

## 2016-10-01 DIAGNOSIS — N186 End stage renal disease: Secondary | ICD-10-CM | POA: Diagnosis not present

## 2016-10-02 ENCOUNTER — Encounter (HOSPITAL_COMMUNITY): Payer: Self-pay | Admitting: Internal Medicine

## 2016-10-08 DIAGNOSIS — N186 End stage renal disease: Secondary | ICD-10-CM | POA: Diagnosis not present

## 2016-10-08 DIAGNOSIS — D631 Anemia in chronic kidney disease: Secondary | ICD-10-CM | POA: Diagnosis not present

## 2016-10-08 DIAGNOSIS — Z992 Dependence on renal dialysis: Secondary | ICD-10-CM | POA: Diagnosis not present

## 2016-10-15 DIAGNOSIS — N2581 Secondary hyperparathyroidism of renal origin: Secondary | ICD-10-CM | POA: Diagnosis not present

## 2016-10-25 ENCOUNTER — Ambulatory Visit (INDEPENDENT_AMBULATORY_CARE_PROVIDER_SITE_OTHER): Payer: PRIVATE HEALTH INSURANCE | Admitting: Nurse Practitioner

## 2016-10-25 ENCOUNTER — Encounter: Payer: Self-pay | Admitting: Nurse Practitioner

## 2016-10-25 VITALS — BP 153/80 | HR 78 | Temp 98.2°F | Ht 63.0 in | Wt 223.2 lb

## 2016-10-25 DIAGNOSIS — K219 Gastro-esophageal reflux disease without esophagitis: Secondary | ICD-10-CM

## 2016-10-25 DIAGNOSIS — K5909 Other constipation: Secondary | ICD-10-CM

## 2016-10-25 DIAGNOSIS — R143 Flatulence: Secondary | ICD-10-CM | POA: Diagnosis not present

## 2016-10-25 MED ORDER — DEXLANSOPRAZOLE 60 MG PO CPDR: 60.0000 mg | DELAYED_RELEASE_CAPSULE | Freq: Every morning | ORAL | 5 refills | Status: DC

## 2016-10-25 NOTE — Addendum Note (Signed)
Addended by: Gordy Levan, ERIC A on: 10/25/2016 11:23 AM   Modules accepted: Orders

## 2016-10-25 NOTE — Assessment & Plan Note (Signed)
The patient's main complaint today is colonic gas. She is not taking MiraLAX currently. I feel this excess gas is likely an adverse effect of her phosphate binders that she takes for end-stage renal disease. I recommend an over-the-counter medication for intestinal gas such as Beano. Information about this was reviewed in the perception [place] with no renal dosing noted and no disease related concerns regarding kidney disease. Return for follow-up as needed for any worsening symptoms.

## 2016-10-25 NOTE — Assessment & Plan Note (Signed)
She initially stated that Dexilant was not helping her GERD symptoms anymore. Upon further discussion she states she has symptoms only about once a month. This is likely related to dietary indiscretions. I will provide further instructions on trigger avoidance. Continue Dexilant, return for follow-up in 6 months.

## 2016-10-25 NOTE — Patient Instructions (Signed)
1. Continue taking Dexilant as you have been. 2. Continue taking Amitiza as you have been. 3. He can try over-the-counter Beano to help with her gas. 4. Return for follow-up in 6 months, or sooner if worsening symptoms.

## 2016-10-25 NOTE — Progress Notes (Signed)
Referring Provider: Jake Samples, PA* Primary Care Physician:  [name] Primary GI:  Dr. Gala Romney  Chief Complaint  Patient presents with  . Gas    pressure in abd area  . Follow-up    had EGD/TCS    HPI:   Patricia [name] is a 45 y.o. female who presents for post procedure follow-up. The patient was last seen in our office 09/11/16 for GERD and constipation. At that time it was noted she had multiple medical problems including chronic renal failure on hemodialysis. Chronic abdominal pain/obstipation/constipation, dyspepsia and GERD. Protonix twice a day not helping, Amitiza not effective, Linzess did not work either. Recommended stop Protonix, try Dexilant 60 mg daily, continue Amitiza with addition of 1 dose of MiraLAX at bedtime. She was arranged for screening colonoscopy on propofol.  Colonoscopy completed 09/27/2016 and found normal colonoscopy. Recommended repeat exam in 10 years. Follow-up in GI clinic 6 weeks. Continue current medications including Amitiza twice a day and MiraLAX at bedtime.  Today she states she's doing ok overall. Continues to have gas pain. Has a bowel movement daily on Amitiza and phosphorus binder (ESRD medication). Is currently out of Dexilant but Dexilant didn't help her GERD much. Her GERD symptoms include esophageal burning, bitter taste, regurgitation; symptoms worse when she lays flat. Typically has intermittent symptoms about once a month. Denies hematochezia, melena, fever, chills. Admits N/V, last episode of emesis 2 days ago, no hematemesis. Takes Phenergan which helps. Denies unintentional weight loss.  Her phosphate binders both list significant potential GI adverse effects including diarrhea.   Past Medical History:  Diagnosis Date  . Blood transfusion without reported diagnosis   . Cancer ([place])   . Diabetes mellitus without complication ([place])   . Dialysis patient ([place])    mon, wed, friday,   . Hypertension   . Renal disorder    . Renal insufficiency     Past Surgical History:  Procedure Laterality Date  . ABDOMINAL HYSTERECTOMY    . AV FISTULA PLACEMENT  11/2014   at [place] N/A 07/10/2016   Procedure: BALLOON DILATION;  Surgeon: Danie Binder, MD;  [facility];  Service: Endoscopy;  Laterality: N/A;  Pyloric dilation  . CESAREAN SECTION    . CHOLECYSTECTOMY    . COLONOSCOPY WITH PROPOFOL N/A 09/27/2016   Procedure: COLONOSCOPY WITH PROPOFOL;  Surgeon: Daneil Dolin, MD;  [facility];  Service: Endoscopy;  Laterality: N/A;  2:00  . DILATION AND CURETTAGE OF UTERUS    . ESOPHAGOGASTRODUODENOSCOPY N/A 07/10/2016   Dr.Fields- normal esophagus, gastric stenosis was found at the pylorus, gastritis on bx, normal examined duodenun    Current Outpatient Prescriptions  Medication Sig Dispense Refill  . aspirin 81 MG tablet Take 81 mg by mouth daily.    . Ferric Citrate (AURYXIA) 1 GM 210 MG(Fe) TABS Take 2 tablets by mouth 2 (two) times daily with a meal.    . furosemide (LASIX) 40 MG tablet Take 40-80 mg by mouth 2 (two) times daily.     Marland Kitchen glipiZIDE (GLUCOTROL) 5 MG tablet Take 5 mg by mouth 2 (two) times daily as needed. For blood sugar levels over 150    . labetalol (NORMODYNE) 300 MG tablet Take 1 tablet (300 mg total) by mouth 2 (two) times daily. (Patient taking differently: Take 200 mg by mouth 2 (two) times daily. ) 60 tablet 2  . lubiprostone (AMITIZA) 24 MCG capsule Take 1 capsule (24 mcg  total) by mouth 2 (two) times daily with a meal. 60 capsule 3  . multivitamin (RENA-VIT) TABS tablet Take 1 tablet by mouth daily.    Marland Kitchen NIFEdipine (PROCARDIA-XL/ADALAT CC) 60 MG 24 hr tablet Take 1 tablet (60 mg total) by mouth daily. 30 tablet 2  . rOPINIRole (REQUIP XL) 2 MG 24 hr tablet Take 2 mg by mouth at bedtime.     . SENSIPAR 30 MG tablet Take 30 mg by mouth daily with supper.     . sevelamer carbonate (RENVELA) 800 MG tablet Take 800 mg by mouth 2 (two) times  daily. Takes 1 tab with snack    . sucralfate (CARAFATE) 1 GM/10ML suspension Take 10 mLs (1 g total) by mouth 4 (four) times daily -  with meals and at bedtime. 420 mL 0  . dexlansoprazole (DEXILANT) 60 MG capsule Take 60 mg by mouth every morning.     No current facility-administered medications for this visit.     Allergies as of 10/25/2016 - Review Complete 10/25/2016  Allergen Reaction Noted  . Reglan [metoclopramide] Other (See Comments) 12/04/2015  . Amlodipine besylate Rash and Other (See Comments) 12/04/2015    Family History  Problem Relation Age of Onset  . Colon cancer Neg Hx     Social History   Social History  . Marital status: Married    Spouse name: N/A  . Number of children: N/A  . Years of education: N/A   Social History Main Topics  . Smoking status: Never Smoker  . Smokeless tobacco: Never Used  . Alcohol use No  . Drug use: No  . Sexual activity: Not Asked   Other Topics Concern  . None   Social History Narrative  . None    Review of Systems: General: Negative for anorexia, weight loss, fever, chills, fatigue, weakness. ENT: Negative for hoarseness, difficulty swallowing. CV: Negative for chest pain, angina, palpitations, peripheral edema.  Respiratory: Negative for dyspnea at rest, cough, sputum, wheezing.  GI: See history of present illness. Endo: Negative for unusual weight change.  Heme: Negative for bruising or bleeding.   Physical Exam: BP (!) 153/80   Pulse 78   Temp 98.2 F (36.8 C) (Oral)   Ht '5\' 3"'$  (1.6 m)   Wt 223 lb 3.2 oz (101.2 kg)   BMI 39.54 kg/m  General:   Obese female. Alert and oriented. Pleasant and cooperative. Well-nourished and well-developed.  Ears:  Normal auditory acuity. Cardiovascular:  S1, S2 present without murmurs appreciated. Extremities without clubbing or edema. Respiratory:  Clear to auscultation bilaterally. No wheezes, rales, or rhonchi. No distress.  Gastrointestinal:  +BS, obese but soft,  non-tender and non-distended. No HSM noted. No guarding or rebound. No masses appreciated.  Rectal:  Deferred  Musculoskalatal:  Symmetrical without gross deformities. Neurologic:  Alert and oriented x4;  grossly normal neurologically. Psych:  Alert and cooperative. Normal mood and affect. Heme/Lymph/Immune: No excessive bruising noted.    10/25/2016 11:07 AM   Disclaimer: This note was dictated with voice recognition software. Similar sounding words can inadvertently be transcribed and may not be corrected upon review.

## 2016-10-26 NOTE — Progress Notes (Signed)
cc'ed to pcp °

## 2016-10-29 DIAGNOSIS — Z992 Dependence on renal dialysis: Secondary | ICD-10-CM | POA: Diagnosis not present

## 2016-10-29 DIAGNOSIS — N2581 Secondary hyperparathyroidism of renal origin: Secondary | ICD-10-CM | POA: Diagnosis not present

## 2016-10-29 DIAGNOSIS — N186 End stage renal disease: Secondary | ICD-10-CM | POA: Diagnosis not present

## 2016-10-29 DIAGNOSIS — D509 Iron deficiency anemia, unspecified: Secondary | ICD-10-CM | POA: Diagnosis not present

## 2016-11-26 DIAGNOSIS — N2581 Secondary hyperparathyroidism of renal origin: Secondary | ICD-10-CM | POA: Diagnosis not present

## 2016-11-26 DIAGNOSIS — N186 End stage renal disease: Secondary | ICD-10-CM | POA: Diagnosis not present

## 2016-11-26 DIAGNOSIS — Z992 Dependence on renal dialysis: Secondary | ICD-10-CM | POA: Diagnosis not present

## 2017-01-01 DIAGNOSIS — H903 Sensorineural hearing loss, bilateral: Secondary | ICD-10-CM | POA: Diagnosis not present

## 2017-01-01 DIAGNOSIS — H6532 Chronic mucoid otitis media, left ear: Secondary | ICD-10-CM | POA: Diagnosis not present

## 2017-01-10 DIAGNOSIS — N186 End stage renal disease: Secondary | ICD-10-CM | POA: Diagnosis not present

## 2017-01-10 DIAGNOSIS — Z992 Dependence on renal dialysis: Secondary | ICD-10-CM | POA: Diagnosis not present

## 2017-01-10 DIAGNOSIS — T82898A Other specified complication of vascular prosthetic devices, implants and grafts, initial encounter: Secondary | ICD-10-CM | POA: Diagnosis not present

## 2017-03-05 ENCOUNTER — Encounter: Payer: Self-pay | Admitting: Nurse Practitioner

## 2017-03-05 ENCOUNTER — Ambulatory Visit (INDEPENDENT_AMBULATORY_CARE_PROVIDER_SITE_OTHER): Payer: PRIVATE HEALTH INSURANCE | Admitting: Nurse Practitioner

## 2017-03-05 VITALS — BP 157/84 | HR 88 | Temp 98.0°F | Ht 63.0 in | Wt 249.0 lb

## 2017-03-05 DIAGNOSIS — K5909 Other constipation: Secondary | ICD-10-CM

## 2017-03-05 NOTE — Progress Notes (Signed)
cc'ed to pcp °

## 2017-03-05 NOTE — Patient Instructions (Signed)
1. I am providing you of samples of Trulance 3 mg. Take this once a day with or without food. 2. I am also giving you a co-pay card to help with the cost, should it be effective. 3. Call us in 2 weeks and let us know if it is working. 4. If it does not have a good effect after the first 3-5 days, add MiraLAX 17 g once a day on top of Trulance. 5. Return for follow-up in 2 months.

## 2017-03-05 NOTE — Assessment & Plan Note (Signed)
Noted worsening constipation since her insurance. Adequately covering Amitiza. This is electing that has worked for her so far. Lactulose, Dulcolax, MiraLAX, Linzess up to 290 g daily have all failed. At this point her last option is Trulance. I will provide her samples last 2 weeks and a co-pay card. Recommend she take once a day. If no improvement in 3-5 days add MiraLAX 17 g once a day on top of that. Call with a progress report in 2 weeks. Return for follow-up in 2 months.

## 2017-03-05 NOTE — Progress Notes (Signed)
Referring Provider: Jake Samples, PA* Primary Care Physician:  [name] Primary GI:  Dr. Gala Romney  Chief Complaint  Patient presents with  . Constipation    HPI:   Patricia [name] is a 46 y.o. female who presents For follow-up on constipation. The patient was last seen in our office 10/25/2016 for GERD, constipation. Colonoscopy up-to-date which was essentially normal. Was previously on Amitiza twice a day and MiraLAX at bedtime and at her last visit she was doing well overall with daily bowel movement on an Amitiza. Having worsening GERD symptoms with Dexilant giving her no relief. Phenergan helps nausea. Recommended continue Dexilant, Amitiza. Attempt over-the-counter be no doubt with flatulence. Return for follow-up in 6 months.  Today she states she's constipated again. At the new year her insurance changed and they do not cover Amitiza. Has been on dulcolax, Miralax, lactulose without good bowel movements. Has tried Linzess previously but failed to work. She is not on any pain medications. Has a bowel movement about once a week with hard stools which tend to be small in amount. Denies hematochezia, melena, unintentional weight loss. When she is severely constipated she will have nausea and/or vomiting. Denies chest pain, dyspnea, dizziness, lightheadedness, syncope, near syncope. Denies any other upper or lower GI symptoms.  Past Medical History:  Diagnosis Date  . Blood transfusion without reported diagnosis   . Cancer ([place])   . Diabetes mellitus without complication ([place])   . Dialysis patient ([place])    mon, wed, friday,   . Hypertension   . Renal disorder   . Renal insufficiency     Past Surgical History:  Procedure Laterality Date  . ABDOMINAL HYSTERECTOMY    . AV FISTULA PLACEMENT  11/2014   at [place] N/A 07/10/2016   Procedure: BALLOON DILATION;  Surgeon: Danie Binder, MD;  [facility];  Service: Endoscopy;   Laterality: N/A;  Pyloric dilation  . CESAREAN SECTION    . CHOLECYSTECTOMY    . COLONOSCOPY WITH PROPOFOL N/A 09/27/2016   Procedure: COLONOSCOPY WITH PROPOFOL;  Surgeon: Daneil Dolin, MD;  [facility];  Service: Endoscopy;  Laterality: N/A;  2:00  . DILATION AND CURETTAGE OF UTERUS    . ESOPHAGOGASTRODUODENOSCOPY N/A 07/10/2016   Dr.Fields- normal esophagus, gastric stenosis was found at the pylorus, gastritis on bx, normal examined duodenun    Current Outpatient Prescriptions  Medication Sig Dispense Refill  . aspirin 81 MG tablet Take 81 mg by mouth daily.    Marland Kitchen dexlansoprazole (DEXILANT) 60 MG capsule Take 1 capsule (60 mg total) by mouth every morning. 30 capsule 5  . Ferric Citrate (AURYXIA) 1 GM 210 MG(Fe) TABS Take 2 tablets by mouth 2 (two) times daily with a meal.    . furosemide (LASIX) 40 MG tablet Take 40-80 mg by mouth 2 (two) times daily.     Marland Kitchen glipiZIDE (GLUCOTROL) 5 MG tablet Take 5 mg by mouth 2 (two) times daily as needed. For blood sugar levels over 150    . labetalol (NORMODYNE) 300 MG tablet Take 1 tablet (300 mg total) by mouth 2 (two) times daily. (Patient taking differently: Take 200 mg by mouth 2 (two) times daily. ) 60 tablet 2  . multivitamin (RENA-VIT) TABS tablet Take 1 tablet by mouth daily.    Marland Kitchen NIFEdipine (PROCARDIA-XL/ADALAT CC) 60 MG 24 hr tablet Take 1 tablet (60 mg total) by mouth daily. 30 tablet 2  . rOPINIRole (REQUIP XL)  2 MG 24 hr tablet Take 2 mg by mouth at bedtime.     . SENSIPAR 30 MG tablet Take 30 mg by mouth daily with supper.     . sevelamer carbonate (RENVELA) 800 MG tablet Take 800 mg by mouth 2 (two) times daily. Takes 1 tab with snack    . sucralfate (CARAFATE) 1 GM/10ML suspension Take 10 mLs (1 g total) by mouth 4 (four) times daily -  with meals and at bedtime. 420 mL 0  . lubiprostone (AMITIZA) 24 MCG capsule Take 1 capsule (24 mcg total) by mouth 2 (two) times daily with a meal. (Patient not taking: Reported on 03/05/2017)  60 capsule 3   No current facility-administered medications for this visit.     Allergies as of 03/05/2017 - Review Complete 03/05/2017  Allergen Reaction Noted  . Reglan [metoclopramide] Other (See Comments) 12/04/2015  . Amlodipine besylate Rash and Other (See Comments) 12/04/2015    Family History  Problem Relation Age of Onset  . Colon cancer Neg Hx     Social History   Social History  . Marital status: Married    Spouse name: N/A  . Number of children: N/A  . Years of education: N/A   Social History Main Topics  . Smoking status: Never Smoker  . Smokeless tobacco: Never Used  . Alcohol use No  . Drug use: No  . Sexual activity: Not Asked   Other Topics Concern  . None   Social History Narrative  . None    Review of Systems: General: Negative for anorexia, weight loss, fever, chills, fatigue, weakness. ENT: Negative for hoarseness, difficulty swallowing. CV: Negative for chest pain, angina, palpitations, peripheral edema.  Respiratory: Negative for dyspnea at rest, cough, sputum, wheezing.  GI: See history of present illness. Endo: Negative for unusual weight change.  Heme: Negative for bruising or bleeding. Allergy: Negative for rash or hives.   Physical Exam: BP (!) 157/84   Pulse 88   Temp 98 F (36.7 C) (Oral)   Ht '5\' 3"'$  (1.6 m)   Wt 249 lb (112.9 kg)   BMI 44.11 kg/m  General:   Obese female. Alert and oriented. Pleasant and cooperative. Well-nourished and well-developed.  Eyes:  Without icterus, sclera clear and conjunctiva pink.  Ears:  Normal auditory acuity. Cardiovascular:  S1, S2 present without murmurs appreciated. Extremities without clubbing or edema. Respiratory:  Clear to auscultation bilaterally. No wheezes, rales, or rhonchi. No distress.  Gastrointestinal:  +BS, rounded but soft, non-tender and non-distended. No HSM noted. No guarding or rebound. No masses appreciated.  Rectal:  Deferred  Musculoskalatal:  Symmetrical without  gross deformities Neurologic:  Alert and oriented x4;  grossly normal neurologically. Psych:  Alert and cooperative. Normal mood and affect. Heme/Lymph/Immune: No excessive bruising noted.    03/05/2017 11:00 AM   Disclaimer: This note was dictated with voice recognition software. Similar sounding words can inadvertently be transcribed and may not be corrected upon review.

## 2017-03-19 ENCOUNTER — Telehealth: Payer: Self-pay | Admitting: Internal Medicine

## 2017-03-19 MED ORDER — PLECANATIDE 3 MG PO TABS: 1.0000 | ORAL_TABLET | Freq: Every day | ORAL | 3 refills | Status: DC

## 2017-03-19 NOTE — Telephone Encounter (Signed)
Done

## 2017-03-19 NOTE — Telephone Encounter (Signed)
Routing to the refill box. 

## 2017-03-19 NOTE — Addendum Note (Signed)
Addended by: Annitta Needs on: 03/19/2017 04:19 PM   Modules accepted: Orders

## 2017-03-19 NOTE — Telephone Encounter (Signed)
Pt has been taking samples of Trulance and they are working for her. She needs a prescription called into [facility] on [address] in [place], [place].

## 2017-04-09 DIAGNOSIS — Z171 Estrogen receptor negative status [ER-]: Secondary | ICD-10-CM | POA: Diagnosis not present

## 2017-04-09 DIAGNOSIS — D0512 Intraductal carcinoma in situ of left breast: Secondary | ICD-10-CM | POA: Diagnosis not present

## 2017-04-18 DIAGNOSIS — D0512 Intraductal carcinoma in situ of left breast: Secondary | ICD-10-CM | POA: Diagnosis not present

## 2017-04-25 ENCOUNTER — Ambulatory Visit: Payer: PRIVATE HEALTH INSURANCE | Admitting: Nurse Practitioner

## 2017-05-07 ENCOUNTER — Ambulatory Visit: Payer: PRIVATE HEALTH INSURANCE | Admitting: Nurse Practitioner

## 2017-05-07 DIAGNOSIS — D0512 Intraductal carcinoma in situ of left breast: Secondary | ICD-10-CM | POA: Diagnosis not present

## 2017-05-07 DIAGNOSIS — Z171 Estrogen receptor negative status [ER-]: Secondary | ICD-10-CM | POA: Diagnosis not present

## 2017-05-07 DIAGNOSIS — C50812 Malignant neoplasm of overlapping sites of left female breast: Secondary | ICD-10-CM | POA: Diagnosis not present

## 2017-05-11 ENCOUNTER — Other Ambulatory Visit: Payer: Self-pay | Admitting: Nurse Practitioner

## 2017-05-11 DIAGNOSIS — K219 Gastro-esophageal reflux disease without esophagitis: Secondary | ICD-10-CM

## 2017-05-11 DIAGNOSIS — K5900 Constipation, unspecified: Secondary | ICD-10-CM

## 2017-05-11 DIAGNOSIS — R1084 Generalized abdominal pain: Secondary | ICD-10-CM

## 2017-05-11 DIAGNOSIS — R112 Nausea with vomiting, unspecified: Secondary | ICD-10-CM

## 2017-05-27 DIAGNOSIS — N2581 Secondary hyperparathyroidism of renal origin: Secondary | ICD-10-CM | POA: Diagnosis not present

## 2017-05-27 DIAGNOSIS — D509 Iron deficiency anemia, unspecified: Secondary | ICD-10-CM | POA: Diagnosis not present

## 2017-05-27 DIAGNOSIS — N186 End stage renal disease: Secondary | ICD-10-CM | POA: Diagnosis not present

## 2017-05-28 ENCOUNTER — Encounter: Payer: Self-pay | Admitting: Nurse Practitioner

## 2017-05-28 ENCOUNTER — Telehealth: Payer: Self-pay | Admitting: Nurse Practitioner

## 2017-05-28 ENCOUNTER — Ambulatory Visit: Payer: PRIVATE HEALTH INSURANCE | Admitting: Nurse Practitioner

## 2017-05-28 NOTE — Telephone Encounter (Signed)
PATIENT WAS A NO SHOW AND LETTER SENT  °

## 2017-05-28 NOTE — Progress Notes (Deleted)
Referring Provider: Jake Samples, PA* Primary Care Physician:  Jake Samples, PA-C Primary GI:  Dr. Gala Romney  No chief complaint on file.   HPI:   Patricia [name] is a 46 y.o. female who presents for follow-up on constipation. The patient was last seen in our office 03/05/2017. Colonoscopy up-to-date and essentially normal. Was doing well on Dexilant and Amitiza. Insurance stopped covering Amitiza and she is constipated at her last visit. She was taking Dulcolax, MiraLAX, lactulose without good result. Previously failed Linzess. No chronic pain medications. Noted bowel movement once a week with hard stools that tend to require straining. No other GI symptoms. She was given samples of Trulance to try as well as a co-pay card. Requested two-week progress report. Recommended adding MiraLAX to Trulance if no good effect after 3-5 days. Follow-up in 2 months. She did call us about 2 weeks later to say Trulance is working well and requested a prescription which was sent to her pharmacy.  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 She is seeing [facility] for this.  Today she states   Past Medical History:  Diagnosis Date  . Blood transfusion without reported diagnosis   . Cancer ([place])   . Diabetes mellitus without complication ([place])   . Dialysis patient ([place])    mon, wed, friday,   . Hypertension   . Renal disorder   . Renal insufficiency     Past Surgical History:  Procedure Laterality Date  . ABDOMINAL HYSTERECTOMY    . AV FISTULA PLACEMENT  11/2014   at [place] N/A 07/10/2016   Procedure: BALLOON DILATION;  Surgeon: Danie Binder, MD;  [facility];  Service: Endoscopy;  Laterality: N/A;  Pyloric dilation  . CESAREAN SECTION    . CHOLECYSTECTOMY    . COLONOSCOPY WITH PROPOFOL N/A 09/27/2016   Procedure: COLONOSCOPY WITH PROPOFOL;  Surgeon: Daneil Dolin, MD;   [facility];  Service: Endoscopy;  Laterality: N/A;  2:00  . DILATION AND CURETTAGE OF UTERUS    . ESOPHAGOGASTRODUODENOSCOPY N/A 07/10/2016   Dr.Fields- normal esophagus, gastric stenosis was found at the pylorus, gastritis on bx, normal examined duodenun    Current Outpatient Prescriptions  Medication Sig Dispense Refill  . AMITIZA 24 MCG capsule TAKE 1 CAPSULE (24 MCG TOTAL) BY MOUTH 2 (TWO) TIMES DAILY WITH A MEAL. 60 capsule 5  . aspirin 81 MG tablet Take 81 mg by mouth daily.    Marland Kitchen dexlansoprazole (DEXILANT) 60 MG capsule Take 1 capsule (60 mg total) by mouth every morning. 30 capsule 5  . Ferric Citrate (AURYXIA) 1 GM 210 MG(Fe) TABS Take 2 tablets by mouth 2 (two) times daily with a meal.    . furosemide (LASIX) 40 MG tablet Take 40-80 mg by mouth 2 (two) times daily.     Marland Kitchen glipiZIDE (GLUCOTROL) 5 MG tablet Take 5 mg by mouth 2 (two) times daily as needed. For blood sugar levels over 150    . labetalol (NORMODYNE) 300 MG tablet Take 1 tablet (300 mg total) by mouth 2 (two) times daily. (Patient taking differently: Take 200 mg by mouth 2 (two) times daily. ) 60 tablet 2  . multivitamin (RENA-VIT) TABS tablet Take 1 tablet by mouth daily.    Marland Kitchen NIFEdipine (PROCARDIA-XL/ADALAT CC) 60 MG 24 hr tablet Take 1 tablet (60 mg total) by mouth daily. 30 tablet  2  . Plecanatide (TRULANCE) 3 MG TABS Take 1 tablet by mouth daily. 90 tablet 3  . rOPINIRole (REQUIP XL) 2 MG 24 hr tablet Take 2 mg by mouth at bedtime.     . SENSIPAR 30 MG tablet Take 30 mg by mouth daily with supper.     . sevelamer carbonate (RENVELA) 800 MG tablet Take 800 mg by mouth 2 (two) times daily. Takes 1 tab with snack    . sucralfate (CARAFATE) 1 GM/10ML suspension Take 10 mLs (1 g total) by mouth 4 (four) times daily -  with meals and at bedtime. 420 mL 0   No current facility-administered medications for this visit.     Allergies as of 05/28/2017 - Review Complete 03/05/2017  Allergen Reaction Noted  .  Reglan [metoclopramide] Other (See Comments) 12/04/2015  . Amlodipine besylate Rash and Other (See Comments) 12/04/2015    Family History  Problem Relation Age of Onset  . Colon cancer Neg Hx     Social History   Social History  . Marital status: Married    Spouse name: N/A  . Number of children: N/A  . Years of education: N/A   Social History Main Topics  . Smoking status: Never Smoker  . Smokeless tobacco: Never Used  . Alcohol use No  . Drug use: No  . Sexual activity: Not on file   Other Topics Concern  . Not on file   Social History Narrative  . No narrative on file    Review of Systems: General: Negative for anorexia, weight loss, fever, chills, fatigue, weakness. Eyes: Negative for vision changes.  ENT: Negative for hoarseness, difficulty swallowing , nasal congestion. CV: Negative for chest pain, angina, palpitations, dyspnea on exertion, peripheral edema.  Respiratory: Negative for dyspnea at rest, dyspnea on exertion, cough, sputum, wheezing.  GI: See history of present illness. GU:  Negative for dysuria, hematuria, urinary incontinence, urinary frequency, nocturnal urination.  MS: Negative for joint pain, low back pain.  Derm: Negative for rash or itching.  Neuro: Negative for weakness, abnormal sensation, seizure, frequent headaches, memory loss, confusion.  Psych: Negative for anxiety, depression, suicidal ideation, hallucinations.  Endo: Negative for unusual weight change.  Heme: Negative for bruising or bleeding. Allergy: Negative for rash or hives.   Physical Exam: There were no vitals taken for this visit. General:   Alert and oriented. Pleasant and cooperative. Well-nourished and well-developed.  Head:  Normocephalic and atraumatic. Eyes:  Without icterus, sclera clear and conjunctiva pink.  Ears:  Normal auditory acuity. Mouth:  No deformity or lesions, oral mucosa pink.  Throat/Neck:  Supple, without mass or thyromegaly. Cardiovascular:  S1,  S2 present without murmurs appreciated. Normal pulses noted. Extremities without clubbing or edema. Respiratory:  Clear to auscultation bilaterally. No wheezes, rales, or rhonchi. No distress.  Gastrointestinal:  +BS, soft, non-tender and non-distended. No HSM noted. No guarding or rebound. No masses appreciated.  Rectal:  Deferred  Musculoskalatal:  Symmetrical without gross deformities. Normal posture. Skin:  Intact without significant lesions or rashes. Neurologic:  Alert and oriented x4;  grossly normal neurologically. Psych:  Alert and cooperative. Normal mood and affect. Heme/Lymph/Immune: No significant cervical adenopathy. No excessive bruising noted.    05/28/2017 8:57 AM   Disclaimer: This note was dictated with voice recognition software. Similar sounding words can inadvertently be transcribed and may not be corrected upon review.

## 2017-05-29 DIAGNOSIS — D509 Iron deficiency anemia, unspecified: Secondary | ICD-10-CM | POA: Diagnosis not present

## 2017-05-29 DIAGNOSIS — N2581 Secondary hyperparathyroidism of renal origin: Secondary | ICD-10-CM | POA: Diagnosis not present

## 2017-05-29 DIAGNOSIS — N186 End stage renal disease: Secondary | ICD-10-CM | POA: Diagnosis not present

## 2017-05-29 NOTE — Telephone Encounter (Signed)
Noted  

## 2017-05-31 DIAGNOSIS — N2581 Secondary hyperparathyroidism of renal origin: Secondary | ICD-10-CM | POA: Diagnosis not present

## 2017-05-31 DIAGNOSIS — N186 End stage renal disease: Secondary | ICD-10-CM | POA: Diagnosis not present

## 2017-05-31 DIAGNOSIS — D509 Iron deficiency anemia, unspecified: Secondary | ICD-10-CM | POA: Diagnosis not present

## 2017-06-13 DIAGNOSIS — Z7984 Long term (current) use of oral hypoglycemic drugs: Secondary | ICD-10-CM | POA: Diagnosis not present

## 2017-06-13 DIAGNOSIS — N186 End stage renal disease: Secondary | ICD-10-CM | POA: Diagnosis not present

## 2017-06-13 DIAGNOSIS — D0512 Intraductal carcinoma in situ of left breast: Secondary | ICD-10-CM | POA: Diagnosis not present

## 2017-06-13 DIAGNOSIS — Z79899 Other long term (current) drug therapy: Secondary | ICD-10-CM | POA: Diagnosis not present

## 2017-06-13 DIAGNOSIS — D631 Anemia in chronic kidney disease: Secondary | ICD-10-CM | POA: Diagnosis not present

## 2017-06-13 DIAGNOSIS — Z780 Asymptomatic menopausal state: Secondary | ICD-10-CM | POA: Diagnosis not present

## 2017-06-13 DIAGNOSIS — Z992 Dependence on renal dialysis: Secondary | ICD-10-CM | POA: Diagnosis not present

## 2017-06-13 DIAGNOSIS — Z7982 Long term (current) use of aspirin: Secondary | ICD-10-CM | POA: Diagnosis not present

## 2017-06-13 DIAGNOSIS — D242 Benign neoplasm of left breast: Secondary | ICD-10-CM | POA: Diagnosis not present

## 2017-06-13 DIAGNOSIS — Z6841 Body Mass Index (BMI) 40.0 and over, adult: Secondary | ICD-10-CM | POA: Diagnosis not present

## 2017-06-13 DIAGNOSIS — I120 Hypertensive chronic kidney disease with stage 5 chronic kidney disease or end stage renal disease: Secondary | ICD-10-CM | POA: Diagnosis not present

## 2017-06-13 DIAGNOSIS — E1122 Type 2 diabetes mellitus with diabetic chronic kidney disease: Secondary | ICD-10-CM | POA: Diagnosis not present

## 2017-06-28 DIAGNOSIS — L02411 Cutaneous abscess of right axilla: Secondary | ICD-10-CM | POA: Diagnosis not present

## 2017-06-28 DIAGNOSIS — Z6841 Body Mass Index (BMI) 40.0 and over, adult: Secondary | ICD-10-CM | POA: Diagnosis not present

## 2017-06-28 DIAGNOSIS — E1129 Type 2 diabetes mellitus with other diabetic kidney complication: Secondary | ICD-10-CM | POA: Diagnosis not present

## 2017-06-28 DIAGNOSIS — F4320 Adjustment disorder, unspecified: Secondary | ICD-10-CM | POA: Diagnosis not present

## 2017-07-02 DIAGNOSIS — H65492 Other chronic nonsuppurative otitis media, left ear: Secondary | ICD-10-CM | POA: Diagnosis not present

## 2017-07-02 DIAGNOSIS — H90A32 Mixed conductive and sensorineural hearing loss, unilateral, left ear with restricted hearing on the contralateral side: Secondary | ICD-10-CM | POA: Diagnosis not present

## 2017-07-02 DIAGNOSIS — H6982 Other specified disorders of Eustachian tube, left ear: Secondary | ICD-10-CM | POA: Diagnosis not present

## 2017-07-02 DIAGNOSIS — D0512 Intraductal carcinoma in situ of left breast: Secondary | ICD-10-CM | POA: Diagnosis not present

## 2017-07-16 DIAGNOSIS — E119 Type 2 diabetes mellitus without complications: Secondary | ICD-10-CM | POA: Diagnosis not present

## 2017-07-19 DIAGNOSIS — Z79899 Other long term (current) drug therapy: Secondary | ICD-10-CM | POA: Diagnosis not present

## 2017-07-19 DIAGNOSIS — Z780 Asymptomatic menopausal state: Secondary | ICD-10-CM | POA: Diagnosis not present

## 2017-07-19 DIAGNOSIS — D0512 Intraductal carcinoma in situ of left breast: Secondary | ICD-10-CM | POA: Diagnosis not present

## 2017-07-21 ENCOUNTER — Inpatient Hospital Stay (HOSPITAL_COMMUNITY)
Admission: EM | Admit: 2017-07-21 | Discharge: 2017-07-24 | DRG: 193 | Disposition: A | Discharge status: Home / Self Care | Payer: PRIVATE HEALTH INSURANCE | Attending: Internal Medicine | Admitting: Internal Medicine

## 2017-07-21 ENCOUNTER — Emergency Department (HOSPITAL_COMMUNITY): Payer: PRIVATE HEALTH INSURANCE

## 2017-07-21 ENCOUNTER — Encounter (HOSPITAL_COMMUNITY): Payer: Self-pay | Admitting: *Deleted

## 2017-07-21 DIAGNOSIS — Z7984 Long term (current) use of oral hypoglycemic drugs: Secondary | ICD-10-CM

## 2017-07-21 DIAGNOSIS — I5032 Chronic diastolic (congestive) heart failure: Secondary | ICD-10-CM | POA: Diagnosis present

## 2017-07-21 DIAGNOSIS — Y95 Nosocomial condition: Secondary | ICD-10-CM | POA: Diagnosis present

## 2017-07-21 DIAGNOSIS — N186 End stage renal disease: Secondary | ICD-10-CM | POA: Diagnosis not present

## 2017-07-21 DIAGNOSIS — Z6841 Body Mass Index (BMI) 40.0 and over, adult: Secondary | ICD-10-CM | POA: Diagnosis not present

## 2017-07-21 DIAGNOSIS — E041 Nontoxic single thyroid nodule: Secondary | ICD-10-CM | POA: Diagnosis present

## 2017-07-21 DIAGNOSIS — J9601 Acute respiratory failure with hypoxia: Secondary | ICD-10-CM | POA: Diagnosis not present

## 2017-07-21 DIAGNOSIS — E669 Obesity, unspecified: Secondary | ICD-10-CM | POA: Diagnosis present

## 2017-07-21 DIAGNOSIS — C50912 Malignant neoplasm of unspecified site of left female breast: Secondary | ICD-10-CM | POA: Diagnosis present

## 2017-07-21 DIAGNOSIS — E0801 Diabetes mellitus due to underlying condition with hyperosmolarity with coma: Secondary | ICD-10-CM | POA: Diagnosis not present

## 2017-07-21 DIAGNOSIS — R0902 Hypoxemia: Secondary | ICD-10-CM

## 2017-07-21 DIAGNOSIS — E1122 Type 2 diabetes mellitus with diabetic chronic kidney disease: Secondary | ICD-10-CM

## 2017-07-21 DIAGNOSIS — D631 Anemia in chronic kidney disease: Secondary | ICD-10-CM | POA: Diagnosis present

## 2017-07-21 DIAGNOSIS — E871 Hypo-osmolality and hyponatremia: Secondary | ICD-10-CM | POA: Diagnosis present

## 2017-07-21 DIAGNOSIS — Z992 Dependence on renal dialysis: Secondary | ICD-10-CM | POA: Diagnosis not present

## 2017-07-21 DIAGNOSIS — E119 Type 2 diabetes mellitus without complications: Secondary | ICD-10-CM

## 2017-07-21 DIAGNOSIS — R0602 Shortness of breath: Secondary | ICD-10-CM

## 2017-07-21 DIAGNOSIS — Z794 Long term (current) use of insulin: Secondary | ICD-10-CM | POA: Diagnosis not present

## 2017-07-21 DIAGNOSIS — J189 Pneumonia, unspecified organism: Secondary | ICD-10-CM | POA: Diagnosis not present

## 2017-07-21 DIAGNOSIS — I10 Essential (primary) hypertension: Secondary | ICD-10-CM | POA: Diagnosis present

## 2017-07-21 DIAGNOSIS — I132 Hypertensive heart and chronic kidney disease with heart failure and with stage 5 chronic kidney disease, or end stage renal disease: Secondary | ICD-10-CM | POA: Diagnosis present

## 2017-07-21 DIAGNOSIS — Z888 Allergy status to other drugs, medicaments and biological substances status: Secondary | ICD-10-CM | POA: Diagnosis not present

## 2017-07-21 DIAGNOSIS — Z79899 Other long term (current) drug therapy: Secondary | ICD-10-CM

## 2017-07-21 DIAGNOSIS — D649 Anemia, unspecified: Secondary | ICD-10-CM | POA: Diagnosis not present

## 2017-07-21 DIAGNOSIS — C50919 Malignant neoplasm of unspecified site of unspecified female breast: Secondary | ICD-10-CM | POA: Diagnosis present

## 2017-07-21 LAB — COMPREHENSIVE METABOLIC PANEL
ALK PHOS: 80 U/L (ref 38–126)
ALT: 8 U/L — AB (ref 14–54)
AST: 20 U/L (ref 15–41)
Albumin: 4 g/dL (ref 3.5–5.0)
Anion gap: 14 (ref 5–15)
BILIRUBIN TOTAL: 0.8 mg/dL (ref 0.3–1.2)
BUN: 45 mg/dL — AB (ref 6–20)
CALCIUM: 9.8 mg/dL (ref 8.9–10.3)
CHLORIDE: 102 mmol/L (ref 101–111)
CO2: 26 mmol/L (ref 22–32)
CREATININE: 9.64 mg/dL — AB (ref 0.44–1.00)
GFR, EST AFRICAN AMERICAN: 5 mL/min — AB (ref >60)
GFR, EST NON AFRICAN AMERICAN: 4 mL/min — AB (ref >60)
Glucose, Bld: 141 mg/dL — ABNORMAL HIGH (ref 65–99)
Potassium: 4.8 mmol/L (ref 3.5–5.1)
Sodium: 142 mmol/L (ref 135–145)
Total Protein: 7.8 g/dL (ref 6.5–8.1)

## 2017-07-21 LAB — CBC WITH DIFFERENTIAL/PLATELET
Basophils Absolute: 0 10*3/uL (ref 0.0–0.1)
Basophils Relative: 0 %
Eosinophils Absolute: 0.4 10*3/uL (ref 0.0–0.7)
Eosinophils Relative: 4 %
HEMATOCRIT: 24.4 % — AB (ref 36.0–46.0)
Hemoglobin: 7.9 g/dL — ABNORMAL LOW (ref 12.0–15.0)
LYMPHS ABS: 1.5 10*3/uL (ref 0.7–4.0)
LYMPHS PCT: 14 %
MCH: 29.2 pg (ref 26.0–34.0)
MCHC: 32.4 g/dL (ref 30.0–36.0)
MCV: 90 fL (ref 78.0–100.0)
MONO ABS: 0.3 10*3/uL (ref 0.1–1.0)
MONOS PCT: 3 %
NEUTROS ABS: 8.4 10*3/uL — AB (ref 1.7–7.7)
Neutrophils Relative %: 79 %
PLATELETS: 268 10*3/uL (ref 150–400)
RBC: 2.71 MIL/uL — ABNORMAL LOW (ref 3.87–5.11)
RDW: 14.2 % (ref 11.5–15.5)
WBC: 10.7 10*3/uL — ABNORMAL HIGH (ref 4.0–10.5)

## 2017-07-21 LAB — TROPONIN I: TROPONIN I: 0.04 ng/mL — AB (ref <0.03)

## 2017-07-21 MED ORDER — SODIUM CHLORIDE 0.9 % IV SOLN
INTRAVENOUS | Status: DC
  Administered 2017-07-21: 1000 mL via INTRAVENOUS

## 2017-07-21 MED ORDER — DEXTROSE 5 % IV SOLN
1.0000 g | Freq: Once | INTRAVENOUS | Status: AC
  Administered 2017-07-22: 1 g via INTRAVENOUS
  Filled 2017-07-21: qty 1

## 2017-07-21 MED ORDER — IOPAMIDOL (ISOVUE-370) INJECTION 76%
100.0000 mL | Freq: Once | INTRAVENOUS | Status: AC | PRN
  Administered 2017-07-21: 100 mL via INTRAVENOUS

## 2017-07-21 NOTE — ED Triage Notes (Signed)
Pt c/o feeling sob x 2 days; pt had a breast lumpectomy on Friday and was unable to go to dialysis; pt states she had trouble with the anesthesia and her O2 sats dropped into the 70's; pt states she went to dialysis on Saturday but has been having increasing sob since her surgery

## 2017-07-21 NOTE — ED Notes (Signed)
Date and time results received: 07/21/17 2227 (use smartphrase ".now" to insert current time)  Test: troponin Critical Value: 0.04  Name of Provider Notified: Dr. Rogene Houston notified at [facility]? Or Actions Taken?: no/na

## 2017-07-21 NOTE — ED Notes (Signed)
Pt Room air O2 sats dropped to 84%. Pt placed back on 2L O2 by nasal canula.

## 2017-07-21 NOTE — ED Notes (Signed)
Initial O2 sats at 88%, pt placed on O2 at 2l by [place]

## 2017-07-21 NOTE — ED Provider Notes (Signed)
[place] DEPT Provider Note   CSN: [id number] Arrival date & time: 07/21/17  2113     History   Chief Complaint Chief Complaint  Patient presents with  . Shortness of Breath    HPI Patricia [name] is a 46 y.o. female.  Patient with complaint of shortness of breath since Friday. Patient is a dialysis patient. Normally dialyzed Monday Wednesdays and Fridays but had a breast biopsy done on Friday at [facility] so had dialysis on Saturday. Patient remained short of breath. Patient states that they've been giving her IV antibiotics with dialysis because she's had some low-grade fevers. Patient does not feel as if she has fever today. Patient has some soreness at the breast biopsy site.      Past Medical History:  Diagnosis Date  . Blood transfusion without reported diagnosis   . Cancer ([place])   . Diabetes mellitus without complication ([place])   . Dialysis patient ([place])    mon, wed, friday,   . Hypertension   . Renal disorder   . Renal insufficiency     Patient Active Problem List   Diagnosis Date Noted  . Flatulence 10/25/2016  . GERD (gastroesophageal reflux disease) 08/02/2016  . Pyloric stenosis, acquired   . Nausea with vomiting   . Generalized abdominal pain   . Chest pain, rule out acute myocardial infarction 07/04/2016  . Constipation 07/04/2016  . Nausea 07/04/2016  . Essential hypertension 07/04/2016  . HCAP (healthcare-associated pneumonia) 12/04/2015  . ESRD on dialysis ([place]) 12/04/2015  . Diabetes mellitus ([place]) 12/04/2015    Past Surgical History:  Procedure Laterality Date  . ABDOMINAL HYSTERECTOMY    . AV FISTULA PLACEMENT  11/2014   at [place] N/A 07/10/2016   Procedure: BALLOON DILATION;  Surgeon: Danie Binder, MD;  Location: [facility];  Service: Endoscopy;  Laterality: N/A;  Pyloric dilation  . CESAREAN SECTION    . CHOLECYSTECTOMY    . COLONOSCOPY WITH PROPOFOL N/A 09/27/2016   Procedure: COLONOSCOPY WITH PROPOFOL;   Surgeon: Daneil Dolin, MD;  Location: [facility] SUITE;  Service: Endoscopy;  Laterality: N/A;  2:00  . DILATION AND CURETTAGE OF UTERUS    . ESOPHAGOGASTRODUODENOSCOPY N/A 07/10/2016   Dr.Fields- normal esophagus, gastric stenosis was found at the pylorus, gastritis on bx, normal examined duodenun    OB History    Gravida Para Term Preterm AB Living   3 2 1 1 1      SAB TAB Ectopic Multiple Live Births   1               Home Medications    Prior to Admission medications   Medication Sig Start Date End Date Taking? Authorizing Provider  acetaminophen (TYLENOL) 500 MG tablet Take 500 mg by mouth 3 (three) times daily as needed for moderate pain.   Yes [provider]  ALPRAZolam Duanne Moron) 0.5 MG tablet Take 0.5 mg by mouth 3 (three) times daily as needed. 06/28/17  Yes [provider]  AMITIZA 24 MCG capsule TAKE 1 CAPSULE (24 MCG TOTAL) BY MOUTH 2 (TWO) TIMES DAILY WITH A MEAL. 05/14/17  Yes Carlis Stable, NP  Ferric Citrate (AURYXIA) 1 GM 210 MG(Fe) TABS Take 2 tablets by mouth 2 (two) times daily with a meal.   Yes [provider]  furosemide (LASIX) 40 MG tablet Take 40-80 mg by mouth 2 (two) times daily. 80mg  in the morning and 40mg  at bedtime   Yes [provider]  glipiZIDE (GLUCOTROL) 5 MG tablet Take 5 mg by mouth 2 (two) times daily as needed. For blood sugar levels over 150   Yes [provider]  labetalol (NORMODYNE) 200 MG tablet Take 200 mg by mouth 2 (two) times daily.   Yes [provider]  multivitamin (RENA-VIT) TABS tablet Take 1 tablet by mouth daily.   Yes [provider]  NIFEdipine (PROCARDIA-XL/ADALAT CC) 60 MG 24 hr tablet Take 1 tablet (60 mg total) by mouth daily. 12/06/15  Yes Isaac Bliss, Rayford Halsted, MD  oxyCODONE (OXY IR/ROXICODONE) 5 MG immediate release tablet Take 5 mg by mouth 3 (three) times daily as needed. 07/19/17 07/24/17 Yes [provider]  pantoprazole (PROTONIX) 20 MG tablet Take 20  mg by mouth daily. 06/28/17  Yes [provider]  rOPINIRole (REQUIP XL) 2 MG 24 hr tablet Take 2 mg by mouth at bedtime.  09/10/16  Yes [provider]  SENSIPAR 30 MG tablet Take 30 mg by mouth daily with supper.  09/12/16  Yes [provider]  sevelamer carbonate (RENVELA) 800 MG tablet Take 1,600 mg by mouth 3 (three) times daily with meals.    Yes [provider]    Family History Family History  Problem Relation Age of Onset  . Colon cancer Neg Hx     Social History Social History  Substance Use Topics  . Smoking status: Never Smoker  . Smokeless tobacco: Never Used  . Alcohol use No     Allergies   Reglan [metoclopramide] and Amlodipine besylate   Review of Systems Review of Systems  Constitutional: Positive for fever.  HENT: Negative for congestion.   Eyes: Negative for visual disturbance.  Respiratory: Positive for shortness of breath. Negative for cough.   Cardiovascular: Negative for chest pain.  Gastrointestinal: Negative for abdominal pain.  Musculoskeletal: Negative for neck pain.  Skin: Positive for wound.  Allergic/Immunologic: Positive for immunocompromised state.  Hematological: Bruises/bleeds easily.     Physical Exam Updated Vital Signs BP (!) 159/84   Pulse 83   Temp 98.6 F (37 C) (Oral)   Resp 13   SpO2 94%   Physical Exam  Constitutional: She is oriented to person, place, and time. She appears well-developed and well-nourished. No distress.  HENT:  Head: Normocephalic and atraumatic.  Mouth/Throat: Oropharynx is clear and moist.  Eyes: Pupils are equal, round, and reactive to light. Conjunctivae and EOM are normal.  Neck: Normal range of motion. Neck supple.  Cardiovascular: Normal rate and regular rhythm.   Pulmonary/Chest: Effort normal and breath sounds normal. She has no wheezes. She has no rales.  Left breast underneath the breast with incision with Steri-Strips measuring about 4 cm. No significant  induration no discharge. No significant erythema. Wound is intact.  Abdominal: Soft. Bowel sounds are normal.  Musculoskeletal: Normal range of motion.  Left arm fistula site without the any evidence of infection. Good thrill. Her's normal.  Neurological: She is alert and oriented to person, place, and time. No cranial nerve deficit or sensory deficit. She exhibits normal muscle tone. Coordination normal.  Skin: Skin is warm.  Nursing note and vitals reviewed.    ED Treatments / Results  Labs (all labs ordered are listed, but only abnormal results are displayed) Labs Reviewed  CBC WITH DIFFERENTIAL/PLATELET - Abnormal; Notable for the following:       Result Value   WBC 10.7 (*)    RBC 2.71 (*)    Hemoglobin 7.9 (*)    HCT  24.4 (*)    Neutro Abs 8.4 (*)    All other components within normal limits  COMPREHENSIVE METABOLIC PANEL - Abnormal; Notable for the following:    Glucose, Bld 141 (*)    BUN 45 (*)    Creatinine, Ser 9.64 (*)    ALT 8 (*)    GFR calc non Af Amer 4 (*)    GFR calc Af Amer 5 (*)    All other components within normal limits  TROPONIN I - Abnormal; Notable for the following:    Troponin I 0.04 (*)    All other components within normal limits  CULTURE, BLOOD (ROUTINE X 2)  CULTURE, BLOOD (ROUTINE X 2)    EKG  EKG Interpretation  Date/Time:  Sunday July 21 2017 21:23:06 EDT Ventricular Rate:  92 PR Interval:    QRS Duration: 90 QT Interval:  367 QTC Calculation: 703 R Axis:   73 Text Interpretation:  Sinus rhythm Baseline wander in lead(s) II III aVF No significant change since last tracing Confirmed by Fredia Sorrow [phone]) on 07/21/2017 10:35:45 PM       Radiology Dg Chest 2 View  Result Date: 07/21/2017 CLINICAL DATA:  Acute onset of shortness of breath. Decreased O2 saturation. Initial encounter. EXAM: CHEST  2 VIEW COMPARISON:  Chest radiograph performed 09/17/2016 FINDINGS: The lungs are well-aerated. Vascular congestion is noted. Hazy  interstitial prominence is noted bilaterally, concerning for mild interstitial edema. Pneumonia could have a similar appearance. There is no evidence of pleural effusion or pneumothorax. The heart is mildly enlarged. No acute osseous abnormalities are seen. IMPRESSION: Vascular congestion and mild cardiomegaly. Hazy interstitial prominence noted bilaterally, concerning for mild interstitial edema. Pneumonia could have a similar appearance. Electronically Signed   By: Garald Balding M.D.   On: 07/21/2017 21:57   Ct Angio Chest Pe W/cm &/or Wo Cm  Result Date: 07/21/2017 CLINICAL DATA:  Acute onset of shortness of breath. Decreased O2 saturation. Left breast pain and weeping, after recent left-sided lumpectomy. Initial encounter. EXAM: CT ANGIOGRAPHY CHEST WITH CONTRAST TECHNIQUE: Multidetector CT imaging of the chest was performed using the standard protocol during bolus administration of intravenous contrast. Multiplanar CT image reconstructions and MIPs were obtained to evaluate the vascular anatomy. CONTRAST:  100 mL of Isovue 370 IV contrast COMPARISON:  Chest radiograph performed earlier today at 9:34 p.m. FINDINGS: Cardiovascular: There is no evidence of significant pulmonary embolus, though evaluation for pulmonary embolus is suboptimal due to limitations in the timing of the contrast bolus. The heart is borderline enlarged. The thoracic aorta is unremarkable. The great vessels are within normal limits. Mediastinum/Nodes: Enlarged mediastinal nodes measure up to 2.0 cm in short axis, noted at the azygoesophageal recess, right paratracheal region, and aortopulmonary window. No pericardial effusion is identified. A 2.2 cm hypodensity is noted at the right thyroid lobe. No axillary lymphadenopathy is seen. Lungs/Pleura: Patchy bilateral airspace opacities raise concern for multifocal pneumonia. Trace right-sided pleural fluid is noted. No dominant mass is identified. No pneumothorax is identified. Upper  Abdomen: The visualized portions of the liver and spleen are grossly unremarkable. Musculoskeletal: A large 8.2 x 6.1 x 6.0 cm collection of fluid and air is noted at the left breast, somewhat loculated in appearance, with suggestion of mild peripheral enhancement. This may reflect an evolving abscess or possibly a complex postoperative seroma. Would correlate with the patient's symptoms. Overlying skin thickening is noted, concerning for cellulitis. No acute osseous abnormalities are identified. The visualized musculature is unremarkable in appearance. Review  of the MIP images confirms the above findings. IMPRESSION: 1. No evidence of significant pulmonary embolus. 2. Patchy bilateral airspace opacities raise concern for multifocal pneumonia. Trace right-sided pleural fluid noted. 3. Large 8.2 x 6.1 x 6.0 cm collection of fluid and air at the left breast, somewhat loculated in appearance, with suggestion of mild peripheral enhancement. This may reflect an evolving abscess or possibly a complex postoperative seroma. Would correlate with the patient's symptoms. Overlying skin thickening is concerning for cellulitis. 4. Enlarged mediastinal nodes measure up to 2.0 cm in short axis. This may reflect the underlying infection. 5. **An incidental finding of potential clinical significance has been found. 2.2 cm hypodensity at the right thyroid lobe. Consider further evaluation with thyroid ultrasound. If patient is clinically hyperthyroid, consider nuclear medicine thyroid uptake and scan.** 6. Borderline cardiomegaly. Electronically Signed   By: Garald Balding M.D.   On: 07/21/2017 23:30    Procedures Procedures (including critical care time)  Medications Ordered in ED Medications  0.9 %  sodium chloride infusion (1,000 mLs Intravenous New Bag/Given 07/21/17 2223)  ceFEPIme (MAXIPIME) 1 g in dextrose 5 % 50 mL IVPB (not administered)  iopamidol (ISOVUE-370) 76 % injection 100 mL (100 mLs Intravenous Contrast  Given 07/21/17 2311)     Initial Impression / Assessment and Plan / ED Course  I have reviewed the triage vital signs and the nursing notes.  Pertinent labs & imaging results that were available during my care of the patient were reviewed by me and considered in my medical decision making (see chart for details).     Patient presented with the complaint of shortness of breath. On Friday at [facility] patient had a repeat left breast biopsy. She had biopsy of previously did not get enough of the correct tissue so was redone. Since that time patient's been short of breath. Patient is a dialysis patient she been on dialysis since 2015 normally dialyzed Monday Wednesdays and Fridays but because of the breast biopsy on this Friday she was dialyzed here locally on Saturday.  Patient states that with dialysis here of late that she has been receiving some IV antibiotics because they were concerned of infection due to low-grade fevers.  Workup here included CT angios chest rule out pulmonary embolus. Which did not show any obvious pulmonary emboli however it raise concern for bilateral patchy infiltrates consistent with pneumonia. Have designated this is a hospital-acquired pneumonia due to the fact that patient has been getting IV antibiotics and that she is a dialysis patient. Blood cultures have been ordered and patient started on HCAP Antibiotic protocols.  Patient is dialyzed locally. Followed by Dr. Elzie Rings, patient's electrolytes to reflect that she probably was dialyzed yesterday. Patient's electrolytes to reflect that she probably was dialyzed yesterday.  Patient will drop her sats off of oxygen. Down to around 85%. On oxygen 2-3 L she sat mid 90s. In and is comfortable if not moving. Patient without any evidence of sepsis. Patient did have a slightly elevated troponin but that has been common for her in the past. Do not feel that she's had an acute cardiac event. Patient's breast biopsy site appears  stable despite the radiology report doesn't seem to be any evidence of infection. No cellulitis.  Patient also with anemia. Hemoglobin 7.9. Patient states that her Epogen was stopped recently.  Discussed with the hospitalist and they will admit here. Patient will probably require dialysis tomorrow.  Final Clinical Impressions(s) / ED Diagnoses   Final diagnoses:  Hypoxia  SOB (shortness of breath)  Anemia, unspecified type  End stage renal disease on dialysis ([place])  HCAP (healthcare-associated pneumonia)    New Prescriptions New Prescriptions   No medications on file     Fredia Sorrow, MD 07/21/17 2357

## 2017-07-21 NOTE — ED Notes (Signed)
Went into room 7 to obtain vital signs before end of my shift and patient was out of the room.

## 2017-07-22 DIAGNOSIS — Z992 Dependence on renal dialysis: Secondary | ICD-10-CM

## 2017-07-22 DIAGNOSIS — R0602 Shortness of breath: Secondary | ICD-10-CM

## 2017-07-22 DIAGNOSIS — C50919 Malignant neoplasm of unspecified site of unspecified female breast: Secondary | ICD-10-CM | POA: Diagnosis present

## 2017-07-22 DIAGNOSIS — N186 End stage renal disease: Secondary | ICD-10-CM

## 2017-07-22 DIAGNOSIS — I10 Essential (primary) hypertension: Secondary | ICD-10-CM

## 2017-07-22 DIAGNOSIS — E0801 Diabetes mellitus due to underlying condition with hyperosmolarity with coma: Secondary | ICD-10-CM

## 2017-07-22 DIAGNOSIS — J9601 Acute respiratory failure with hypoxia: Secondary | ICD-10-CM

## 2017-07-22 DIAGNOSIS — Z794 Long term (current) use of insulin: Secondary | ICD-10-CM

## 2017-07-22 DIAGNOSIS — J189 Pneumonia, unspecified organism: Principal | ICD-10-CM

## 2017-07-22 LAB — CBC WITH DIFFERENTIAL/PLATELET
BASOS ABS: 0 10*3/uL (ref 0.0–0.1)
BASOS PCT: 0 %
Eosinophils Absolute: 0.3 10*3/uL (ref 0.0–0.7)
Eosinophils Relative: 3 %
HEMATOCRIT: 22.5 % — AB (ref 36.0–46.0)
Hemoglobin: 7.1 g/dL — ABNORMAL LOW (ref 12.0–15.0)
Lymphocytes Relative: 8 %
Lymphs Abs: 1 10*3/uL (ref 0.7–4.0)
MCH: 28.5 pg (ref 26.0–34.0)
MCHC: 31.6 g/dL (ref 30.0–36.0)
MCV: 90.4 fL (ref 78.0–100.0)
Monocytes Absolute: 0.3 10*3/uL (ref 0.1–1.0)
Monocytes Relative: 2 %
NEUTROS ABS: 11.1 10*3/uL — AB (ref 1.7–7.7)
Neutrophils Relative %: 87 %
PLATELETS: 242 10*3/uL (ref 150–400)
RBC: 2.49 MIL/uL — ABNORMAL LOW (ref 3.87–5.11)
RDW: 14.3 % (ref 11.5–15.5)
WBC: 12.7 10*3/uL — ABNORMAL HIGH (ref 4.0–10.5)

## 2017-07-22 LAB — BASIC METABOLIC PANEL
ANION GAP: 14 (ref 5–15)
BUN: 47 mg/dL — ABNORMAL HIGH (ref 6–20)
CALCIUM: 8.9 mg/dL (ref 8.9–10.3)
CO2: 26 mmol/L (ref 22–32)
Chloride: 96 mmol/L — ABNORMAL LOW (ref 101–111)
Creatinine, Ser: 10.79 mg/dL — ABNORMAL HIGH (ref 0.44–1.00)
GFR, EST AFRICAN AMERICAN: 4 mL/min — AB (ref >60)
GFR, EST NON AFRICAN AMERICAN: 4 mL/min — AB (ref >60)
Glucose, Bld: 142 mg/dL — ABNORMAL HIGH (ref 65–99)
Potassium: 5.2 mmol/L — ABNORMAL HIGH (ref 3.5–5.1)
Sodium: 136 mmol/L (ref 135–145)

## 2017-07-22 LAB — MRSA PCR SCREENING: MRSA BY PCR: NEGATIVE

## 2017-07-22 LAB — GLUCOSE, CAPILLARY
GLUCOSE-CAPILLARY: 153 mg/dL — AB (ref 65–99)
Glucose-Capillary: 133 mg/dL — ABNORMAL HIGH (ref 65–99)
Glucose-Capillary: 138 mg/dL — ABNORMAL HIGH (ref 65–99)
Glucose-Capillary: 145 mg/dL — ABNORMAL HIGH (ref 65–99)

## 2017-07-22 LAB — STREP PNEUMONIAE URINARY ANTIGEN: STREP PNEUMO URINARY ANTIGEN: NEGATIVE

## 2017-07-22 MED ORDER — RENA-VITE PO TABS
1.0000 | ORAL_TABLET | Freq: Every day | ORAL | Status: DC
  Administered 2017-07-22 – 2017-07-24 (×3): 1 via ORAL
  Filled 2017-07-22 (×4): qty 1

## 2017-07-22 MED ORDER — RISPERIDONE 1 MG PO TABS
1.0000 mg | ORAL_TABLET | Freq: Two times a day (BID) | ORAL | Status: DC
  Administered 2017-07-22 – 2017-07-23 (×4): 1 mg via ORAL
  Filled 2017-07-22 (×4): qty 1

## 2017-07-22 MED ORDER — SEVELAMER CARBONATE 800 MG PO TABS
1600.0000 mg | ORAL_TABLET | Freq: Three times a day (TID) | ORAL | Status: DC
  Administered 2017-07-22 – 2017-07-24 (×9): 1600 mg via ORAL
  Filled 2017-07-22 (×11): qty 2

## 2017-07-22 MED ORDER — FUROSEMIDE 40 MG PO TABS
40.0000 mg | ORAL_TABLET | Freq: Every evening | ORAL | Status: DC
  Administered 2017-07-22 – 2017-07-24 (×3): 40 mg via ORAL
  Filled 2017-07-22 (×3): qty 1

## 2017-07-22 MED ORDER — VANCOMYCIN HCL IN DEXTROSE 1-5 GM/200ML-% IV SOLN
1000.0000 mg | INTRAVENOUS | Status: DC
  Administered 2017-07-22: 1000 mg via INTRAVENOUS
  Filled 2017-07-22: qty 200

## 2017-07-22 MED ORDER — HYDROCODONE-HOMATROPINE 5-1.5 MG/5ML PO SYRP
5.0000 mL | ORAL_SOLUTION | ORAL | Status: DC | PRN
  Administered 2017-07-22 – 2017-07-23 (×3): 5 mL via ORAL
  Filled 2017-07-22 (×3): qty 5

## 2017-07-22 MED ORDER — OXYCODONE HCL 5 MG PO TABS: 5.0000 mg | ORAL_TABLET | Freq: Three times a day (TID) | ORAL | Status: DC | PRN

## 2017-07-22 MED ORDER — LUBIPROSTONE 24 MCG PO CAPS
24.0000 ug | ORAL_CAPSULE | Freq: Two times a day (BID) | ORAL | Status: DC
  Administered 2017-07-22 – 2017-07-24 (×6): 24 ug via ORAL
  Filled 2017-07-22 (×7): qty 1

## 2017-07-22 MED ORDER — VANCOMYCIN HCL IN DEXTROSE 1-5 GM/200ML-% IV SOLN
1000.0000 mg | INTRAVENOUS | Status: AC
  Administered 2017-07-22 (×2): 1000 mg via INTRAVENOUS
  Filled 2017-07-22 (×3): qty 200

## 2017-07-22 MED ORDER — PANTOPRAZOLE SODIUM 40 MG PO TBEC
40.0000 mg | DELAYED_RELEASE_TABLET | Freq: Every day | ORAL | Status: DC
  Administered 2017-07-22 – 2017-07-24 (×3): 40 mg via ORAL
  Filled 2017-07-22 (×3): qty 1

## 2017-07-22 MED ORDER — FERROUS SULFATE 325 (65 FE) MG PO TABS
325.0000 mg | ORAL_TABLET | Freq: Two times a day (BID) | ORAL | Status: DC
  Administered 2017-07-22 – 2017-07-24 (×5): 325 mg via ORAL
  Filled 2017-07-22 (×6): qty 1

## 2017-07-22 MED ORDER — NIFEDIPINE ER OSMOTIC RELEASE 30 MG PO TB24
60.0000 mg | ORAL_TABLET | Freq: Every day | ORAL | Status: DC
  Administered 2017-07-22 – 2017-07-24 (×3): 60 mg via ORAL
  Filled 2017-07-22 (×3): qty 2

## 2017-07-22 MED ORDER — ACETAMINOPHEN 500 MG PO TABS
500.0000 mg | ORAL_TABLET | Freq: Once | ORAL | Status: AC
  Administered 2017-07-22: 500 mg via ORAL
  Filled 2017-07-22: qty 1

## 2017-07-22 MED ORDER — DEXTROSE 5 % IV SOLN
2.0000 g | INTRAVENOUS | Status: DC
  Administered 2017-07-22: 2 g via INTRAVENOUS
  Filled 2017-07-22 (×2): qty 2

## 2017-07-22 MED ORDER — SODIUM CHLORIDE 0.9 % IV SOLN
250.0000 mL | INTRAVENOUS | Status: DC | PRN
Start: 2017-07-22 — End: 2017-07-24

## 2017-07-22 MED ORDER — SODIUM CHLORIDE 0.9% FLUSH: 3.0000 mL | INTRAVENOUS | Status: DC | PRN

## 2017-07-22 MED ORDER — ACETAMINOPHEN 500 MG PO TABS
500.0000 mg | ORAL_TABLET | Freq: Three times a day (TID) | ORAL | Status: DC | PRN
  Administered 2017-07-22 (×4): 500 mg via ORAL
  Filled 2017-07-22 (×4): qty 1

## 2017-07-22 MED ORDER — FERRIC CITRATE 1 GM 210 MG(FE) PO TABS
420.0000 mg | ORAL_TABLET | Freq: Two times a day (BID) | ORAL | Status: DC
  Filled 2017-07-22 (×5): qty 2

## 2017-07-22 MED ORDER — SODIUM CHLORIDE 0.9% FLUSH
3.0000 mL | Freq: Two times a day (BID) | INTRAVENOUS | Status: DC
  Administered 2017-07-22 – 2017-07-24 (×6): 3 mL via INTRAVENOUS

## 2017-07-22 MED ORDER — PENTAFLUOROPROP-TETRAFLUOROETH EX AERO: 1.0000 "application " | INHALATION_SPRAY | CUTANEOUS | Status: DC | PRN

## 2017-07-22 MED ORDER — FUROSEMIDE 40 MG PO TABS: 40.0000 mg | ORAL_TABLET | Freq: Two times a day (BID) | ORAL | Status: DC

## 2017-07-22 MED ORDER — NEPRO/CARBSTEADY PO LIQD: 237.0000 mL | Freq: Two times a day (BID) | ORAL | Status: DC

## 2017-07-22 MED ORDER — PANTOPRAZOLE SODIUM 20 MG PO TBEC
20.0000 mg | DELAYED_RELEASE_TABLET | Freq: Every day | ORAL | Status: DC
  Filled 2017-07-22 (×3): qty 1

## 2017-07-22 MED ORDER — CINACALCET HCL 30 MG PO TABS
30.0000 mg | ORAL_TABLET | Freq: Every day | ORAL | Status: DC
  Administered 2017-07-22 – 2017-07-24 (×3): 30 mg via ORAL
  Filled 2017-07-22 (×3): qty 1

## 2017-07-22 MED ORDER — LABETALOL HCL 200 MG PO TABS
200.0000 mg | ORAL_TABLET | Freq: Two times a day (BID) | ORAL | Status: DC
  Administered 2017-07-22 – 2017-07-23 (×3): 200 mg via ORAL
  Filled 2017-07-22 (×4): qty 1

## 2017-07-22 MED ORDER — FUROSEMIDE 80 MG PO TABS
80.0000 mg | ORAL_TABLET | Freq: Every morning | ORAL | Status: DC
  Administered 2017-07-22 – 2017-07-23 (×2): 80 mg via ORAL
  Filled 2017-07-22 (×2): qty 1

## 2017-07-22 MED ORDER — LIDOCAINE-PRILOCAINE 2.5-2.5 % EX CREA: 1.0000 "application " | TOPICAL_CREAM | CUTANEOUS | Status: DC | PRN

## 2017-07-22 MED ORDER — SODIUM CHLORIDE 0.9 % IV SOLN: 100.0000 mL | INTRAVENOUS | Status: DC | PRN

## 2017-07-22 MED ORDER — INSULIN ASPART 100 UNIT/ML ~~LOC~~ SOLN
0.0000 [IU] | Freq: Three times a day (TID) | SUBCUTANEOUS | Status: DC
  Administered 2017-07-23 (×2): 3 [IU] via SUBCUTANEOUS
  Administered 2017-07-23 – 2017-07-24 (×3): 2 [IU] via SUBCUTANEOUS

## 2017-07-22 MED ORDER — LIDOCAINE HCL (PF) 1 % IJ SOLN: 5.0000 mL | INTRAMUSCULAR | Status: DC | PRN

## 2017-07-22 MED ORDER — ROPINIROLE HCL ER 2 MG PO TB24: 2.0000 mg | ORAL_TABLET | Freq: Every day | ORAL | Status: DC

## 2017-07-22 NOTE — Progress Notes (Signed)
FOLLOW UP NOTE FROM EARLIER ADMISSION TODAY:  is a 46 y.o. female with medical history significant of ESRD (secondary to HTN and DM) on dialysis normally MWF, obesity, IDDM , recent diagnosis of breast cancer unknown type or stage s/p lumpectomy of left lower breast at [facility] Friday (07/19/17) was briefly intubated for procedure comes in with worsening sob, fever, hemoptysis, productive cough, and admitted for multilobar PNA.  She was given IV antibiotics, and was dialyzed this morning.  Exam:  VSS, and she is in no respiratory distress. She is alert and conversing. We will continue with her current therapy.  Will give her cough syrup as she "vomit when I cough too much"  Orvan Falconer MD FACP. Hospitalist.

## 2017-07-22 NOTE — Procedures (Signed)
    HEMODIALYSIS TREATMENT NOTE:  4.5 hour heparin-free dialysis completed via left upper arm AVF (15g/antegrade). Goal NOT met: Unable to tolerate removal of 4 liters as ordered due to interrupted ultrafiltration the last 15 minutes of treatment (due to cramping). 3.8 L removed.  All blood was returned and hemostasis was achieved within 20 minutes. Report given to Sharyn Blitz, RN.  Rockwell Alexandria, RN, CDN

## 2017-07-22 NOTE — Plan of Care (Signed)
Problem: Bowel/Gastric: Goal: Will not experience complications related to bowel motility Outcome: Not Progressing Pt c/o constipation to Dr Lowanda Foster.  Problem: Respiratory: Goal: Respiratory status will improve Outcome: Not Progressing SOB W/ EVEN MILD EXERTION

## 2017-07-22 NOTE — H&P (Signed)
History and Physical    Merly Hinkson KPV:[id number] DOB: 1971-05-21 DOA: 07/21/2017  PCP: Jake Samples, PA-C  Patient coming from: home  Chief Complaint:   Low grade fever, cough, sob  HPI: Patricia [name] is a 46 y.o. female with medical history significant of ESRD (secondary to HTN and DM) on dialysis normally MWF, obesity, IDDM , recent diagnosis of breast cancer unknown type or stage s/p lumpectomy of left lower breast at [facility] Friday (07/19/17) was briefly intubated for procedure comes in with worsening sob, fever, hemoptysis, productive cough.  Pt reports she did not get dialysis on Friday due to her procedure but it was arranged for her to have a session on Saturday instead.  At dialysis on sat they started her on empiric abx for pna and she was advised if she got worse to come to ED.  Her sob got worse and so she came to ED.  Found to be hypoxic around 86% on RA with ambulation.  Pt does not require supplemental o2 at home.  She denies any swelling or edema or pain to her legs.  She is suppose to start radiation to her breast soon.  She was on the renal transplant at [place] but has been taken off the list now due to this new dx of cancer and she has to be cancer free for 5 years before being reconsidered for transplant.  Pt found to have bilateral pna and referred for admission for aggressive iv antibiotic treatment. Unknown what abx she got at dialysis Saturday.  Review of Systems: As per HPI otherwise 10 point review of systems negative.   Past Medical History:  Diagnosis Date  . Blood transfusion without reported diagnosis   . Cancer ([place])   . Diabetes mellitus without complication ([place])   . Dialysis patient ([place])    mon, wed, friday,   . Hypertension   . Renal disorder   . Renal insufficiency     Past Surgical History:  Procedure Laterality Date  . ABDOMINAL HYSTERECTOMY    . AV FISTULA PLACEMENT  11/2014   at [place] N/A 07/10/2016   Procedure:  BALLOON DILATION;  Surgeon: Danie Binder, MD;  Location: [facility];  Service: Endoscopy;  Laterality: N/A;  Pyloric dilation  . CESAREAN SECTION    . CHOLECYSTECTOMY    . COLONOSCOPY WITH PROPOFOL N/A 09/27/2016   Procedure: COLONOSCOPY WITH PROPOFOL;  Surgeon: Daneil Dolin, MD;  Location: [facility];  Service: Endoscopy;  Laterality: N/A;  2:00  . DILATION AND CURETTAGE OF UTERUS    . ESOPHAGOGASTRODUODENOSCOPY N/A 07/10/2016   Dr.Fields- normal esophagus, gastric stenosis was found at the pylorus, gastritis on bx, normal examined duodenun     reports that she has never smoked. She has never used smokeless tobacco. She reports that she does not drink alcohol or use drugs.  Allergies  Allergen Reactions  . Reglan [Metoclopramide] Other (See Comments)    hallucinations hallucinations  . Amlodipine Besylate Rash and Other (See Comments)    dizziness    Family History  Problem Relation Age of Onset  . Colon cancer Neg Hx     Prior to Admission medications   Medication Sig Start Date End Date Taking? Authorizing Provider  acetaminophen (TYLENOL) 500 MG tablet Take 500 mg by mouth 3 (three) times daily as needed for moderate pain.   Yes [provider]  ALPRAZolam Duanne Moron) 0.5 MG tablet Take 0.5 mg by mouth 3 (three)  times daily as needed. 06/28/17  Yes [provider]  AMITIZA 24 MCG capsule TAKE 1 CAPSULE (24 MCG TOTAL) BY MOUTH 2 (TWO) TIMES DAILY WITH A MEAL. 05/14/17  Yes Carlis Stable, NP  Ferric Citrate (AURYXIA) 1 GM 210 MG(Fe) TABS Take 2 tablets by mouth 2 (two) times daily with a meal.   Yes [provider]  furosemide (LASIX) 40 MG tablet Take 40-80 mg by mouth 2 (two) times daily. 80mg  in the morning and 40mg  at bedtime   Yes [provider]  glipiZIDE (GLUCOTROL) 5 MG tablet Take 5 mg by mouth 2 (two) times daily as needed. For blood sugar levels over 150   Yes [provider]  labetalol (NORMODYNE) 200 MG tablet Take 200  mg by mouth 2 (two) times daily.   Yes [provider]  multivitamin (RENA-VIT) TABS tablet Take 1 tablet by mouth daily.   Yes [provider]  NIFEdipine (PROCARDIA-XL/ADALAT CC) 60 MG 24 hr tablet Take 1 tablet (60 mg total) by mouth daily. 12/06/15  Yes Isaac Bliss, Rayford Halsted, MD  oxyCODONE (OXY IR/ROXICODONE) 5 MG immediate release tablet Take 5 mg by mouth 3 (three) times daily as needed. 07/19/17 07/24/17 Yes [provider]  pantoprazole (PROTONIX) 20 MG tablet Take 20 mg by mouth daily. 06/28/17  Yes [provider]  rOPINIRole (REQUIP XL) 2 MG 24 hr tablet Take 2 mg by mouth at bedtime.  09/10/16  Yes [provider]  SENSIPAR 30 MG tablet Take 30 mg by mouth daily with supper.  09/12/16  Yes [provider]  sevelamer carbonate (RENVELA) 800 MG tablet Take 1,600 mg by mouth 3 (three) times daily with meals.    Yes [provider]    Physical Exam: Vitals:   07/22/17 0015 07/22/17 0030 07/22/17 0115 07/22/17 0131  BP:  (!) 188/75  (!) 167/72  Pulse: 86 82  83  Resp: 10     Temp:    98.5 F (36.9 C)  TempSrc:    Oral  SpO2: 91% 95% 97% 97%  Weight:         Constitutional: NAD, calm, comfortable Vitals:   07/22/17 0015 07/22/17 0030 07/22/17 0115 07/22/17 0131  BP:  (!) 188/75  (!) 167/72  Pulse: 86 82  83  Resp: 10     Temp:    98.5 F (36.9 C)  TempSrc:    Oral  SpO2: 91% 95% 97% 97%  Weight:       Eyes: PERRL, lids and conjunctivae normal ENMT: Mucous membranes are moist. Posterior pharynx clear of any exudate or lesions.Normal dentition.  Neck: normal, supple, no masses, no thyromegaly Respiratory: clear to auscultation bilaterally, no wheezing, no crackles. Normal respiratory effort. No accessory muscle use.  Cardiovascular: Regular rate and rhythm, no murmurs / rubs / gallops. No extremity edema. 2+ pedal pulses. No carotid bruits.  Abdomen: no tenderness, no masses palpated. No hepatosplenomegaly.  Bowel sounds positive.  Musculoskeletal: no clubbing / cyanosis. No joint deformity upper and lower extremities. Good ROM, no contractures. Normal muscle tone.  Skin: no rashes, lesions, ulcers. No induration  Left lower breast incision c/d/i, no absess no pain no signs or symptoms of infection Neurologic: CN 2-12 grossly intact. Sensation intact, DTR normal. Strength 5/5 in all 4.  Psychiatric: Normal judgment and insight. Alert and oriented x 3. Normal mood.    Labs on Admission: I have personally reviewed following labs and imaging studies  CBC:  Recent Labs Lab  07/21/17 2131  WBC 10.7*  NEUTROABS 8.4*  HGB 7.9*  HCT 24.4*  MCV 90.0  PLT 751   Basic Metabolic Panel:  Recent Labs Lab 07/21/17 2131  NA 142  K 4.8  CL 102  CO2 26  GLUCOSE 141*  BUN 45*  CREATININE 9.64*  CALCIUM 9.8   GFR: Estimated Creatinine Clearance: 8.7 mL/min (A) (by C-G formula based on SCr of 9.64 mg/dL (H)). Liver Function Tests:  Recent Labs Lab 07/21/17 2131  AST 20  ALT 8*  ALKPHOS 80  BILITOT 0.8  PROT 7.8  ALBUMIN 4.0   Cardiac Enzymes:  Recent Labs Lab 07/21/17 2131  TROPONINI 0.04*    Urine analysis:    Component Value Date/Time   COLORURINE YELLOW 09/14/2016 1915   APPEARANCEUR CLEAR 09/14/2016 1915   LABSPEC 1.015 09/14/2016 1915   PHURINE 7.5 09/14/2016 1915   GLUCOSEU >1000 (A) 09/14/2016 1915   HGBUR TRACE (A) 09/14/2016 1915   BILIRUBINUR NEGATIVE 09/14/2016 [place] NEGATIVE 09/14/2016 1915   PROTEINUR >300 (A) 09/14/2016 1915   NITRITE NEGATIVE 09/14/2016 1915   LEUKOCYTESUR NEGATIVE 09/14/2016 1915    Recent Results (from the past 240 hour(s))  Culture, blood (Routine X 2) w Reflex to ID Panel     Status: None (Preliminary result)   Collection Time: 07/22/17 12:01 AM  Result Value Ref Range Status   Specimen Description RIGHT ANTECUBITAL  Final   Special Requests Blood Culture adequate volume  Final   Culture PENDING  Incomplete   Report  Status PENDING  Incomplete  Culture, blood (Routine X 2) w Reflex to ID Panel     Status: None (Preliminary result)   Collection Time: 07/22/17 12:04 AM  Result Value Ref Range Status   Specimen Description BLOOD RIGHT HAND  Final   Special Requests Blood Culture adequate volume  Final   Culture PENDING  Incomplete   Report Status PENDING  Incomplete     Radiological Exams on Admission: Dg Chest 2 View  Result Date: 07/21/2017 CLINICAL DATA:  Acute onset of shortness of breath. Decreased O2 saturation. Initial encounter. EXAM: CHEST  2 VIEW COMPARISON:  Chest radiograph performed 09/17/2016 FINDINGS: The lungs are well-aerated. Vascular congestion is noted. Hazy interstitial prominence is noted bilaterally, concerning for mild interstitial edema. Pneumonia could have a similar appearance. There is no evidence of pleural effusion or pneumothorax. The heart is mildly enlarged. No acute osseous abnormalities are seen. IMPRESSION: Vascular congestion and mild cardiomegaly. Hazy interstitial prominence noted bilaterally, concerning for mild interstitial edema. Pneumonia could have a similar appearance. Electronically Signed   By: Garald Balding M.D.   On: 07/21/2017 21:57   Ct Angio Chest Pe W/cm &/or Wo Cm  Result Date: 07/21/2017 CLINICAL DATA:  Acute onset of shortness of breath. Decreased O2 saturation. Left breast pain and weeping, after recent left-sided lumpectomy. Initial encounter. EXAM: CT ANGIOGRAPHY CHEST WITH CONTRAST TECHNIQUE: Multidetector CT imaging of the chest was performed using the standard protocol during bolus administration of intravenous contrast. Multiplanar CT image reconstructions and MIPs were obtained to evaluate the vascular anatomy. CONTRAST:  100 mL of Isovue 370 IV contrast COMPARISON:  Chest radiograph performed earlier today at 9:34 p.m. FINDINGS: Cardiovascular: There is no evidence of significant pulmonary embolus, though evaluation for pulmonary embolus is suboptimal  due to limitations in the timing of the contrast bolus. The heart is borderline enlarged. The thoracic aorta is unremarkable. The great vessels are within normal limits. Mediastinum/Nodes: Enlarged mediastinal nodes measure up  to 2.0 cm in short axis, noted at the azygoesophageal recess, right paratracheal region, and aortopulmonary window. No pericardial effusion is identified. A 2.2 cm hypodensity is noted at the right thyroid lobe. No axillary lymphadenopathy is seen. Lungs/Pleura: Patchy bilateral airspace opacities raise concern for multifocal pneumonia. Trace right-sided pleural fluid is noted. No dominant mass is identified. No pneumothorax is identified. Upper Abdomen: The visualized portions of the liver and spleen are grossly unremarkable. Musculoskeletal: A large 8.2 x 6.1 x 6.0 cm collection of fluid and air is noted at the left breast, somewhat loculated in appearance, with suggestion of mild peripheral enhancement. This may reflect an evolving abscess or possibly a complex postoperative seroma. Would correlate with the patient's symptoms. Overlying skin thickening is noted, concerning for cellulitis. No acute osseous abnormalities are identified. The visualized musculature is unremarkable in appearance. Review of the MIP images confirms the above findings. IMPRESSION: 1. No evidence of significant pulmonary embolus. 2. Patchy bilateral airspace opacities raise concern for multifocal pneumonia. Trace right-sided pleural fluid noted. 3. Large 8.2 x 6.1 x 6.0 cm collection of fluid and air at the left breast, somewhat loculated in appearance, with suggestion of mild peripheral enhancement. This may reflect an evolving abscess or possibly a complex postoperative seroma. Would correlate with the patient's symptoms. Overlying skin thickening is concerning for cellulitis. 4. Enlarged mediastinal nodes measure up to 2.0 cm in short axis. This may reflect the underlying infection. 5. **An incidental finding of  potential clinical significance has been found. 2.2 cm hypodensity at the right thyroid lobe. Consider further evaluation with thyroid ultrasound. If patient is clinically hyperthyroid, consider nuclear medicine thyroid uptake and scan.** 6. Borderline cardiomegaly. Electronically Signed   By: Garald Balding M.D.   On: 07/21/2017 23:30    EKG: Independently reviewed. nsr no acute issues Old chart reviewed Case discussed with edp cxr reviewed with edema vs infiltrates bilaterally  Assessment/Plan 46 yo female diaylsis pt comes in with acute hypoxic respiratory failure due to HCAP  Principal Problem:   HCAP (healthcare-associated pneumonia)- blood and sputum cx.  cta neg for PE.  Iv vanc and cefepime ordered with pharm to dose.    Active Problems:   Acute respiratory failure with hypoxia ([place])- due to infection, treat pna   ESRD on dialysis [facility])- consult nephrology for dialysis in am.  Pt known to dr Lynnette Caffey   Diabetes mellitus ([place])- ssi, hold oral meds from home   Essential hypertension- cont home meds   Breast cancer ([place])- noted.  Biopsy site looks good.  Cont outpt follow at [facility] for her further treatment plan     DVT prophylaxis: scds, ambulate  Code Status:  full Family Communication:  none Disposition Plan:  Per day team Consults called:  Nephrology requested Admission status:  admission   Dory Demont A MD [facility]  If 7PM-7AM, please contact night-coverage [email or web address] Password [email or web address]  07/22/2017, 3:22 AM

## 2017-07-22 NOTE — Consult Note (Signed)
Reason for Consult: End-stage renal disease Referring Physician: Dr. Aggie Cosier Patricia [name] is an 46 y.o. female.  HPI: She is a patient who has history of diabetes, hypertension, recently diagnosed left breast cancer, end-stage renal disease on maintenance hemodialysis presently came with complaints of cough, fever, shortness of breath, productive cough and an episode of hematemesis. Patient was at [facility] last week and she had breast biopsy on 07/19/2017. During that time she was temporarily intubated. After her discharge on Saturday patient was seen in dialysis unit. During that time she was complaining of the same issue. Since patient was found to have fluid overload were able to remove about 4 L and give her antibiotics. Since patient didn't improve and she came to the emergency room yesterday and she was found to have bilateral infiltrate and submitted for healthcare associated pneumonia. Presently she says that she is feeling somewhat better but still feels weak and with some difficulty breathing. Patient denies any nausea or vomiting.  Past Medical History:  Diagnosis Date  . Blood transfusion without reported diagnosis   . Cancer ([place])   . Diabetes mellitus without complication ([place])   . Dialysis patient ([place])    mon, wed, friday,   . Hypertension   . Renal disorder   . Renal insufficiency     Past Surgical History:  Procedure Laterality Date  . ABDOMINAL HYSTERECTOMY    . AV FISTULA PLACEMENT  11/2014   at [place] N/A 07/10/2016   Procedure: BALLOON DILATION;  Surgeon: Danie Binder, MD;  Location: [facility] ENDO SU[facility];  Service: Endoscopy;  Laterality: N/A;  Pyloric dilation  . CESAREAN SECTION    . CHOLECYSTECTOMY    . COLONOSCOPY WITH PROPOFOL N/A 09/27/2016   Procedure: COLONOSCOPY WITH PROPOFOL;  Surgeon: Daneil Dolin, MD;  Location: [facility] END[facility] SU[facility];  Service: Endoscopy;  Laterality: N/A;  2:00  . DILATION AND CURETTAGE OF UTERUS    .  ESOPHAGOGASTRODUODENOSCOPY N/A 07/10/2016   Dr.Fields- normal esophagus, gastric stenosis was found at the pylorus, gastritis on bx, normal examined duodenun    Family History  Problem Relation Age of Onset  . Colon cancer Neg Hx     Social History:  reports that she has never smoked. She has never used smokeless tobacco. She reports that she does not drink alcohol or use drugs.  Allergies:  Allergies  Allergen Reactions  . Reglan [Metoclopramide] Other (See Comments)    hallucinations hallucinations  . Amlodipine Besylate Rash and Other (See Comments)    dizziness    Medications: I have reviewed the patient's current medications.  Results for orders placed or performed during the hospital encounter of 07/21/17 (from the past 48 hour(s))  CBC with Differential     Status: Abnormal   Collection Time: 07/21/17  9:31 PM  Result Value Ref Range   WBC 10.7 (H) 4.0 - 10.5 K/uL   RBC 2.71 (L) 3.87 - 5.11 MIL/uL   Hemoglobin 7.9 (L) 12.0 - 15.0 g/dL   HCT 24.4 (L) 36.0 - 46.0 %   MCV 90.0 78.0 - 100.0 fL   MCH 29.2 26.0 - 34.0 pg   MCHC 32.4 30.0 - 36.0 g/dL   RDW 14.2 11.5 - 15.5 %   Platelets 268 150 - 400 K/uL   Neutrophils Relative % 79 %   Neutro Abs 8.4 (H) 1.7 - 7.7 K/uL   Lymphocytes Relative 14 %   Lymphs Abs 1.5 0.7 - 4.0 K/uL   Monocytes  Relative 3 %   Monocytes Absolute 0.3 0.1 - 1.0 K/uL   Eosinophils Relative 4 %   Eosinophils Absolute 0.4 0.0 - 0.7 K/uL   Basophils Relative 0 %   Basophils Absolute 0.0 0.0 - 0.1 K/uL  Comprehensive metabolic panel     Status: Abnormal   Collection Time: 07/21/17  9:31 PM  Result Value Ref Range   Sodium 142 135 - 145 mmol/L   Potassium 4.8 3.5 - 5.1 mmol/L   Chloride 102 101 - 111 mmol/L   CO2 26 22 - 32 mmol/L   Glucose, Bld 141 (H) 65 - 99 mg/dL   BUN 45 (H) 6 - 20 mg/dL   Creatinine, Ser 9.64 (H) 0.44 - 1.00 mg/dL   Calcium 9.8 8.9 - 10.3 mg/dL   Total Protein 7.8 6.5 - 8.1 g/dL   Albumin 4.0 3.5 - 5.0 g/dL   AST 20  15 - 41 U/L   ALT 8 (L) 14 - 54 U/L   Alkaline Phosphatase 80 38 - 126 U/L   Total Bilirubin 0.8 0.3 - 1.2 mg/dL   GFR calc non Af Amer 4 (L) >60 mL/min   GFR calc Af Amer 5 (L) >60 mL/min    Comment: (NOTE) The eGFR has been calculated using the CKD EPI equation. This calculation has not been validated in all clinical situations. eGFR's persistently <60 mL/min signify possible Chronic Kidney Disease.    Anion gap 14 5 - 15  Troponin I     Status: Abnormal   Collection Time: 07/21/17  9:31 PM  Result Value Ref Range   Troponin I 0.04 (HH) <0.03 ng/mL    Comment: CRITICAL RESULT CALLED TO, READ BACK BY AND VERIFIED WITH: TALBOT,T. AT 2227 ON 07/21/2017 BY EVA   Culture, blood (Routine X 2) w Reflex to ID Panel     Status: None (Preliminary result)   Collection Time: 07/22/17 12:01 AM  Result Value Ref Range   Specimen Description RIGHT ANTECUBITAL    Special Requests Blood Culture adequate volume    Culture NO GROWTH < 12 HOURS    Report Status PENDING   Culture, blood (Routine X 2) w Reflex to ID Panel     Status: None (Preliminary result)   Collection Time: 07/22/17 12:04 AM  Result Value Ref Range   Specimen Description BLOOD RIGHT HAND    Special Requests Blood Culture adequate volume    Culture NO GROWTH < 12 HOURS    Report Status PENDING   MRSA PCR Screening     Status: None   Collection Time: 07/22/17  2:17 AM  Result Value Ref Range   MRSA by PCR NEGATIVE NEGATIVE    Comment:        The GeneXpert MRSA Assay (FDA approved for NASAL specimens only), is one component of a comprehensive MRSA colonization surveillance program. It is not intended to diagnose MRSA infection nor to guide or monitor treatment for MRSA infections.   CBC WITH DIFFERENTIAL     Status: Abnormal   Collection Time: 07/22/17  6:32 AM  Result Value Ref Range   WBC 12.7 (H) 4.0 - 10.5 K/uL   RBC 2.49 (L) 3.87 - 5.11 MIL/uL   Hemoglobin 7.1 (L) 12.0 - 15.0 g/dL   HCT 22.5 (L) 36.0 - 46.0 %    MCV 90.4 78.0 - 100.0 fL   MCH 28.5 26.0 - 34.0 pg   MCHC 31.6 30.0 - 36.0 g/dL   RDW 14.3 11.5 - 15.5 %  Platelets 242 150 - 400 K/uL   Neutrophils Relative % 87 %   Neutro Abs 11.1 (H) 1.7 - 7.7 K/uL   Lymphocytes Relative 8 %   Lymphs Abs 1.0 0.7 - 4.0 K/uL   Monocytes Relative 2 %   Monocytes Absolute 0.3 0.1 - 1.0 K/uL   Eosinophils Relative 3 %   Eosinophils Absolute 0.3 0.0 - 0.7 K/uL   Basophils Relative 0 %   Basophils Absolute 0.0 0.0 - 0.1 K/uL  Basic metabolic panel     Status: Abnormal   Collection Time: 07/22/17  6:32 AM  Result Value Ref Range   Sodium 136 135 - 145 mmol/L   Potassium 5.2 (H) 3.5 - 5.1 mmol/L   Chloride 96 (L) 101 - 111 mmol/L   CO2 26 22 - 32 mmol/L   Glucose, Bld 142 (H) 65 - 99 mg/dL   BUN 47 (H) 6 - 20 mg/dL   Creatinine, Ser 10.79 (H) 0.44 - 1.00 mg/dL   Calcium 8.9 8.9 - 10.3 mg/dL   GFR calc non Af Amer 4 (L) >60 mL/min   GFR calc Af Amer 4 (L) >60 mL/min    Comment: (NOTE) The eGFR has been calculated using the CKD EPI equation. This calculation has not been validated in all clinical situations. eGFR's persistently <60 mL/min signify possible Chronic Kidney Disease.    Anion gap 14 5 - 15  Glucose, capillary     Status: Abnormal   Collection Time: 07/22/17  8:25 AM  Result Value Ref Range   Glucose-Capillary 133 (H) 65 - 99 mg/dL    Dg Chest 2 View  Result Date: 07/21/2017 CLINICAL DATA:  Acute onset of shortness of breath. Decreased O2 saturation. Initial encounter. EXAM: CHEST  2 VIEW COMPARISON:  Chest radiograph performed 09/17/2016 FINDINGS: The lungs are well-aerated. Vascular congestion is noted. Hazy interstitial prominence is noted bilaterally, concerning for mild interstitial edema. Pneumonia could have a similar appearance. There is no evidence of pleural effusion or pneumothorax. The heart is mildly enlarged. No acute osseous abnormalities are seen. IMPRESSION: Vascular congestion and mild cardiomegaly. Hazy  interstitial prominence noted bilaterally, concerning for mild interstitial edema. Pneumonia could have a similar appearance. Electronically Signed   By: Garald Balding M.D.   On: 07/21/2017 21:57   Ct Angio Chest Pe W/cm &/or Wo Cm  Result Date: 07/21/2017 CLINICAL DATA:  Acute onset of shortness of breath. Decreased O2 saturation. Left breast pain and weeping, after recent left-sided lumpectomy. Initial encounter. EXAM: CT ANGIOGRAPHY CHEST WITH CONTRAST TECHNIQUE: Multidetector CT imaging of the chest was performed using the standard protocol during bolus administration of intravenous contrast. Multiplanar CT image reconstructions and MIPs were obtained to evaluate the vascular anatomy. CONTRAST:  100 mL of Isovue 370 IV contrast COMPARISON:  Chest radiograph performed earlier today at 9:34 p.m. FINDINGS: Cardiovascular: There is no evidence of significant pulmonary embolus, though evaluation for pulmonary embolus is suboptimal due to limitations in the timing of the contrast bolus. The heart is borderline enlarged. The thoracic aorta is unremarkable. The great vessels are within normal limits. Mediastinum/Nodes: Enlarged mediastinal nodes measure up to 2.0 cm in short axis, noted at the azygoesophageal recess, right paratracheal region, and aortopulmonary window. No pericardial effusion is identified. A 2.2 cm hypodensity is noted at the right thyroid lobe. No axillary lymphadenopathy is seen. Lungs/Pleura: Patchy bilateral airspace opacities raise concern for multifocal pneumonia. Trace right-sided pleural fluid is noted. No dominant mass is identified. No pneumothorax is identified. Upper Abdomen:  The visualized portions of the liver and spleen are grossly unremarkable. Musculoskeletal: A large 8.2 x 6.1 x 6.0 cm collection of fluid and air is noted at the left breast, somewhat loculated in appearance, with suggestion of mild peripheral enhancement. This may reflect an evolving abscess or possibly a complex  postoperative seroma. Would correlate with the patient's symptoms. Overlying skin thickening is noted, concerning for cellulitis. No acute osseous abnormalities are identified. The visualized musculature is unremarkable in appearance. Review of the MIP images confirms the above findings. IMPRESSION: 1. No evidence of significant pulmonary embolus. 2. Patchy bilateral airspace opacities raise concern for multifocal pneumonia. Trace right-sided pleural fluid noted. 3. Large 8.2 x 6.1 x 6.0 cm collection of fluid and air at the left breast, somewhat loculated in appearance, with suggestion of mild peripheral enhancement. This may reflect an evolving abscess or possibly a complex postoperative seroma. Would correlate with the patient's symptoms. Overlying skin thickening is concerning for cellulitis. 4. Enlarged mediastinal nodes measure up to 2.0 cm in short axis. This may reflect the underlying infection. 5. **An incidental finding of potential clinical significance has been found. 2.2 cm hypodensity at the right thyroid lobe. Consider further evaluation with thyroid ultrasound. If patient is clinically hyperthyroid, consider nuclear medicine thyroid uptake and scan.** 6. Borderline cardiomegaly. Electronically Signed   By: Garald Balding M.D.   On: 07/21/2017 23:30    Review of Systems  Constitutional: Positive for chills, fever and malaise/fatigue.  Respiratory: Positive for cough, hemoptysis and sputum production.   Cardiovascular: Positive for leg swelling. Negative for chest pain and orthopnea.       She has exertional dyspnea  Gastrointestinal: Positive for constipation. Negative for nausea and vomiting.  Neurological: Positive for weakness.   Blood pressure (!) 185/86, pulse 87, temperature 98.9 F (37.2 C), temperature source Oral, resp. rate 20, height _0  (1.6 m), weight 116.3 kg (256 lb 4.8 oz), SpO2 94 %. Physical Exam  Constitutional: No distress.  Eyes: No scleral icterus.  Neck: No JVD  present.  Cardiovascular: Normal rate and regular rhythm.   No murmur heard. Respiratory: No respiratory distress. She has no wheezes.  GI: She exhibits no distension. There is no tenderness. There is no rebound.  Musculoskeletal: She exhibits no edema.    Assessment/Plan: Problem #1 difficulty in breathing: Possibly a combination of pneumonia and also fluid overload. Patient is on nasal oxygen and feeling better. Still she has some cough but no hemoptysis. Problem #2 anemia: Her hemoglobin is significantly low. Not sure whether patient has lost some blood during her biopsy. Problem #3 history of left breast cancer she is status post lumpectomy last week. Patient is not started on palliation therapy. Problem #4 history of diabetes Problem #5 hypertension: Her blood pressure is reasonably controlled Problem #6 obesity Problem #7 incidental finding: Patient was thyroid nodule Problem #8 bone and mineral disorder: Calcium is is in range but phosphorus is not available. Plan: We'll make arrangements for patient to get dialysis today for 41/2 hours 2] we'll try to remove about 4 L if systolic blood pressure remains above 90 3] We will transfuse her if RBC is ready on dialysis 4] we'll check her CBC and renal panel in the morning.  Patricia [name] S 07/22/2017, 8:44 AM

## 2017-07-22 NOTE — Progress Notes (Signed)
ANTIBIOTIC CONSULT NOTE-Preliminary  Pharmacy Consult for vancomycin Indication: pneumonia  Allergies  Allergen Reactions  . Reglan [Metoclopramide] Other (See Comments)    hallucinations hallucinations  . Amlodipine Besylate Rash and Other (See Comments)    dizziness    Patient Measurements: Weight: 249 lb (112.9 kg) Adjusted Body Weight: 71 kg  Vital Signs: Temp: 98.6 F (37 C) (08/05 2157) Temp Source: Oral (08/05 2157) BP: 173/88 (08/05 2350) Pulse Rate: 86 (08/05 2350)  Labs:  Recent Labs  07/21/17 2131  WBC 10.7*  HGB 7.9*  PLT 268  CREATININE 9.64*    Estimated Creatinine Clearance: 8.7 mL/min (A) (by C-G formula based on SCr of 9.64 mg/dL (H)).  No results for input(s): VANCOTROUGH, VANCOPEAK, VANCORANDOM, GENTTROUGH, GENTPEAK, GENTRANDOM, TOBRATROUGH, TOBRAPEAK, TOBRARND, AMIKACINPEAK, AMIKACINTROU, AMIKACIN in the last 72 hours.   Microbiology: No results found for this or any previous visit (from the past 720 hour(s)).  Medical History: Past Medical History:  Diagnosis Date  . Blood transfusion without reported diagnosis   . Cancer ([place])   . Diabetes mellitus without complication ([place])   . Dialysis patient ([place])    mon, wed, friday,   . Hypertension   . Renal disorder   . Renal insufficiency     Medications:   (Not in a hospital admission) Scheduled:   Infusions:  . sodium chloride 1,000 mL (07/21/17 2223)  . ceFEPime (MAXIPIME) IV 1 g (07/22/17 0018)  . vancomycin     PRN:  Anti-infectives    Start     Dose/Rate Route Frequency Ordered Stop   07/22/17 0030  vancomycin (VANCOCIN) IVPB 1000 mg/200 mL premix     1,000 mg 200 mL/hr over 60 Minutes Intravenous Every 1 hr x 2 07/22/17 0021 07/22/17 0229   07/21/17 2345  ceFEPIme (MAXIPIME) 1 g in dextrose 5 % 50 mL IVPB     1 g 100 mL/hr over 30 Minutes Intravenous  Once 07/21/17 2341        Assessment: 46 yo female with SOB.  Pt had dialysis yesterday (Saturday) and will likely  have another session tomorrow. Patient stated she was getting antibiotics with dialysis, but doesn't know what they were.   Goal of Therapy:  Vancomycin trough level 15-20 mcg/ml  Plan:  Preliminary review of pertinent patient information completed.  Protocol will be initiated with dose(s) of vancomycin 2 gram loading dose. [place] clinical pharmacist will complete review during morning rounds to assess patient and finalize treatment regimen if needed.  Nyra Capes, RPH 07/22/2017,12:21 AM

## 2017-07-22 NOTE — Progress Notes (Addendum)
ANTIBIOTIC CONSULT NOTE-Preliminary  Pharmacy Consult for cefepime Indication: pneumonia  Allergies  Allergen Reactions  . Reglan [Metoclopramide] Other (See Comments)    hallucinations hallucinations  . Amlodipine Besylate Rash and Other (See Comments)    dizziness    Patient Measurements: Weight: 249 lb (112.9 kg) Adjusted Body Weight:   Vital Signs: Temp: 98.5 F (36.9 C) (08/06 0131) Temp Source: Oral (08/06 0131) BP: 167/72 (08/06 0131) Pulse Rate: 83 (08/06 0131)  Labs:  Recent Labs  07/21/17 2131  WBC 10.7*  HGB 7.9*  PLT 268  CREATININE 9.64*    Estimated Creatinine Clearance: 8.7 mL/min (A) (by C-G formula based on SCr of 9.64 mg/dL (H)).  No results for input(s): VANCOTROUGH, VANCOPEAK, VANCORANDOM, GENTTROUGH, GENTPEAK, GENTRANDOM, TOBRATROUGH, TOBRAPEAK, TOBRARND, AMIKACINPEAK, AMIKACINTROU, AMIKACIN in the last 72 hours.   Microbiology: Recent Results (from the past 720 hour(s))  Culture, blood (Routine X 2) w Reflex to ID Panel     Status: None (Preliminary result)   Collection Time: 07/22/17 12:01 AM  Result Value Ref Range Status   Specimen Description RIGHT ANTECUBITAL  Final   Special Requests Blood Culture adequate volume  Final   Culture PENDING  Incomplete   Report Status PENDING  Incomplete  Culture, blood (Routine X 2) w Reflex to ID Panel     Status: None (Preliminary result)   Collection Time: 07/22/17 12:04 AM  Result Value Ref Range Status   Specimen Description BLOOD RIGHT HAND  Final   Special Requests Blood Culture adequate volume  Final   Culture PENDING  Incomplete   Report Status PENDING  Incomplete  MRSA PCR Screening     Status: None   Collection Time: 07/22/17  2:17 AM  Result Value Ref Range Status   MRSA by PCR NEGATIVE NEGATIVE Final    Comment:        The GeneXpert MRSA Assay (FDA approved for NASAL specimens only), is one component of a comprehensive MRSA colonization surveillance program. It is not intended to  diagnose MRSA infection nor to guide or monitor treatment for MRSA infections.     Medical History: Past Medical History:  Diagnosis Date  . Blood transfusion without reported diagnosis   . Cancer ([place])   . Diabetes mellitus without complication ([place])   . Dialysis patient ([place])    mon, wed, friday,   . Hypertension   . Renal disorder   . Renal insufficiency     Medications:  Prescriptions Prior to Admission  Medication Sig Dispense Refill Last Dose  . acetaminophen (TYLENOL) 500 MG tablet Take 500 mg by mouth 3 (three) times daily as needed for moderate pain.   07/21/2017 at Unknown time  . ALPRAZolam (XANAX) 0.5 MG tablet Take 0.5 mg by mouth 3 (three) times daily as needed.  0 unknown  . AMITIZA 24 MCG capsule TAKE 1 CAPSULE (24 MCG TOTAL) BY MOUTH 2 (TWO) TIMES DAILY WITH A MEAL. 60 capsule 5 07/21/2017 at Unknown time  . Ferric Citrate (AURYXIA) 1 GM 210 MG(Fe) TABS Take 2 tablets by mouth 2 (two) times daily with a meal.   07/21/2017 at Unknown time  . furosemide (LASIX) 40 MG tablet Take 40-80 mg by mouth 2 (two) times daily. 80mg  in the morning and 40mg  at bedtime   07/21/2017 at Unknown time  . glipiZIDE (GLUCOTROL) 5 MG tablet Take 5 mg by mouth 2 (two) times daily as needed. For blood sugar levels over 150   unknown  . labetalol (NORMODYNE) 200 MG tablet  Take 200 mg by mouth 2 (two) times daily.   07/21/2017 at [place]  . multivitamin (RENA-VIT) TABS tablet Take 1 tablet by mouth daily.   07/21/2017 at Unknown time  . NIFEdipine (PROCARDIA-XL/ADALAT CC) 60 MG 24 hr tablet Take 1 tablet (60 mg total) by mouth daily. 30 tablet 2 07/21/2017 at Unknown time  . oxyCODONE (OXY IR/ROXICODONE) 5 MG immediate release tablet Take 5 mg by mouth 3 (three) times daily as needed.   Past Week at Unknown time  . pantoprazole (PROTONIX) 20 MG tablet Take 20 mg by mouth daily.  1 07/21/2017 at Unknown time  . rOPINIRole (REQUIP XL) 2 MG 24 hr tablet Take 2 mg by mouth at bedtime.    07/20/2017 at Unknown time   . SENSIPAR 30 MG tablet Take 30 mg by mouth daily with supper.    07/21/2017 at Unknown time  . sevelamer carbonate (RENVELA) 800 MG tablet Take 1,600 mg by mouth 3 (three) times daily with meals.    07/21/2017 at Unknown time   Scheduled:  . cinacalcet  30 mg Oral Q supper  . feeding supplement (NEPRO CARB STEADY)  237 mL Oral BID BM  . ferric citrate  420 mg Oral BID WC  . furosemide  40 mg Oral QPM  . furosemide  80 mg Oral q morning - 10a  . insulin aspart  0-9 Units Subcutaneous TID WC  . labetalol  200 mg Oral BID  . lubiprostone  24 mcg Oral BID WC  . multivitamin  1 tablet Oral Daily  . NIFEdipine  60 mg Oral Daily  . pantoprazole  20 mg Oral Daily  . rOPINIRole  2 mg Oral QHS  . sevelamer carbonate  1,600 mg Oral TID WC  . sodium chloride flush  3 mL Intravenous Q12H   Infusions:  . sodium chloride    . ceFEPime (MAXIPIME) IV     PRN: sodium chloride, acetaminophen, oxyCODONE, sodium chloride flush Anti-infectives    Start     Dose/Rate Route Frequency Ordered Stop   07/22/17 1200  ceFEPIme (MAXIPIME) 2 g in dextrose 5 % 50 mL IVPB     2 g 100 mL/hr over 30 Minutes Intravenous Every M-W-F (Hemodialysis) 07/22/17 0114 07/31/17 1159   07/22/17 0030  vancomycin (VANCOCIN) IVPB 1000 mg/200 mL premix     1,000 mg 200 mL/hr over 60 Minutes Intravenous Every 1 hr x 2 07/22/17 0021 07/22/17 0359   07/21/17 2345  ceFEPIme (MAXIPIME) 1 g in dextrose 5 % 50 mL IVPB     1 g 100 mL/hr over 30 Minutes Intravenous  Once 07/21/17 2341 07/22/17 0115      A/P: 46 yo dialysis patient with dx of HCAP.  Pt received 1 gram of cefepime in ED tonight.  HD schedule is MWF.  Scheduled next cefepime doses (cefepime 2 grams) for dialysis days. Starting 8/6.  Will need to be rescheduled if dialysis days change.   Preliminary review of pertinent patient information completed. [facility] clinical pharmacist will complete review during morning rounds to assess patient and finalize treatment regimen  if needed.  [place], Tiffany Scarlett, RPH 07/22/2017,4:15 AM  Addum:  Cont cefepime as ordered.  Vancomycin 2gm load given, give 1gm after each HD.  F/u cultures and clinical course Excell Seltzer, PharmD

## 2017-07-23 DIAGNOSIS — D649 Anemia, unspecified: Secondary | ICD-10-CM

## 2017-07-23 LAB — GLUCOSE, CAPILLARY
GLUCOSE-CAPILLARY: 158 mg/dL — AB (ref 65–99)
GLUCOSE-CAPILLARY: 238 mg/dL — AB (ref 65–99)
Glucose-Capillary: 221 mg/dL — ABNORMAL HIGH (ref 65–99)
Glucose-Capillary: 187 mg/dL — ABNORMAL HIGH (ref 65–99)

## 2017-07-23 LAB — CBC WITH DIFFERENTIAL/PLATELET
Basophils Absolute: 0 10*3/uL (ref 0.0–0.1)
Basophils Relative: 0 %
Eosinophils Absolute: 0.5 10*3/uL (ref 0.0–0.7)
Eosinophils Relative: 4 %
HEMATOCRIT: 24.5 % — AB (ref 36.0–46.0)
HEMOGLOBIN: 7.8 g/dL — AB (ref 12.0–15.0)
LYMPHS ABS: 1.2 10*3/uL (ref 0.7–4.0)
Lymphocytes Relative: 10 %
MCH: 29.1 pg (ref 26.0–34.0)
MCHC: 31.8 g/dL (ref 30.0–36.0)
MCV: 91.4 fL (ref 78.0–100.0)
MONO ABS: 0.3 10*3/uL (ref 0.1–1.0)
MONOS PCT: 2 %
NEUTROS ABS: 10 10*3/uL — AB (ref 1.7–7.7)
NEUTROS PCT: 84 %
Platelets: 281 10*3/uL (ref 150–400)
RBC: 2.68 MIL/uL — ABNORMAL LOW (ref 3.87–5.11)
RDW: 14.4 % (ref 11.5–15.5)
WBC: 11.9 10*3/uL — ABNORMAL HIGH (ref 4.0–10.5)

## 2017-07-23 LAB — RENAL FUNCTION PANEL
ANION GAP: 14 (ref 5–15)
Albumin: 3.7 g/dL (ref 3.5–5.0)
BUN: 27 mg/dL — ABNORMAL HIGH (ref 6–20)
CALCIUM: 8.9 mg/dL (ref 8.9–10.3)
CHLORIDE: 94 mmol/L — AB (ref 101–111)
CO2: 28 mmol/L (ref 22–32)
Creatinine, Ser: 7.56 mg/dL — ABNORMAL HIGH (ref 0.44–1.00)
GFR calc Af Amer: 7 mL/min — ABNORMAL LOW (ref >60)
GFR calc non Af Amer: 6 mL/min — ABNORMAL LOW (ref >60)
GLUCOSE: 147 mg/dL — AB (ref 65–99)
POTASSIUM: 4.2 mmol/L (ref 3.5–5.1)
Phosphorus: 4.3 mg/dL (ref 2.5–4.6)
SODIUM: 136 mmol/L (ref 135–145)

## 2017-07-23 LAB — CBC
HCT: 25.2 % — ABNORMAL LOW (ref 36.0–46.0)
HEMOGLOBIN: 7.8 g/dL — AB (ref 12.0–15.0)
MCH: 28.3 pg (ref 26.0–34.0)
MCHC: 31 g/dL (ref 30.0–36.0)
MCV: 91.3 fL (ref 78.0–100.0)
Platelets: 295 10*3/uL (ref 150–400)
RBC: 2.76 MIL/uL — ABNORMAL LOW (ref 3.87–5.11)
RDW: 14.4 % (ref 11.5–15.5)
WBC: 12.7 10*3/uL — ABNORMAL HIGH (ref 4.0–10.5)

## 2017-07-23 LAB — HEPATITIS B SURFACE ANTIGEN: Hepatitis B Surface Ag: NEGATIVE

## 2017-07-23 MED ORDER — OXYCODONE-ACETAMINOPHEN 5-325 MG PO TABS: 1.0000 | ORAL_TABLET | ORAL | Status: DC | PRN

## 2017-07-23 MED ORDER — LORAZEPAM 2 MG/ML IJ SOLN
1.0000 mg | INTRAMUSCULAR | Status: DC | PRN
  Administered 2017-07-23 – 2017-07-24 (×2): 1 mg via INTRAVENOUS
  Filled 2017-07-23 (×2): qty 1

## 2017-07-23 MED ORDER — ONDANSETRON HCL 4 MG/2ML IJ SOLN
4.0000 mg | Freq: Four times a day (QID) | INTRAMUSCULAR | Status: DC | PRN
  Administered 2017-07-23: 4 mg via INTRAVENOUS
  Filled 2017-07-23: qty 2

## 2017-07-23 NOTE — Care Management Note (Signed)
Case Management Note  Patient Details  Name: Patricia [name] MRN: [id number] Date of Birth: [birth year]  Subjective/Objective:                  Pt admitted with HCAP. Pt from home, lives with son and is ind with ADL's. She is employed as Curator, has insurance and PCP. She drives. She is ESRD on HD MWF at [facility] in [place]. Pt on oxygen acutely. Concerned she will have to have oxygen at DC and will not be able to work.   Action/Plan: Pt plans to return home with self care. CM will follow to make sure pt has weaned from oxygen and will not need it at DC.   Expected Discharge Date:     07/24/2017             Expected Discharge Plan:  Home/Self Care  In-House Referral:  NA  Discharge planning Services  CM Consult  Post Acute Care Choice:  NA Choice offered to:  NA  Status of Service:  In process, will continue to follow  Sherald Barge, RN 07/23/2017, 2:02 PM

## 2017-07-23 NOTE — Progress Notes (Signed)
PROGRESS NOTE    Patricia [name]  KCM:[id number] DOB: May 27, 1971 DOA: 07/21/2017 PCP: Jake Samples, PA-C    Brief Narrative: is a 46 y.o.femalewith medical history significant of ESRD (secondary to HTN and DM) on dialysis normally MWF, obesity, IDDM , recent diagnosis of breast cancer unknown type or stage s/p lumpectomy of left lower breast at [facility] Friday (07/19/17) was briefly intubated for procedure comes in with worsening sob, fever, hemoptysis, productive cough, and admitted for multilobar PNA.  She was given IV antibiotics, and was continued on her dialysis schedule.  Her main complaint is nausea with coughing.     Assessment & Plan:   Principal Problem:   HCAP (healthcare-associated pneumonia) Active Problems:   ESRD on dialysis ([place])   Diabetes mellitus ([place])   Essential hypertension   Acute respiratory failure with hypoxia ([place])   Breast cancer ([place])   1. Multilobar PNA:  Will continue with HCAP.  She is stable with no SOB and stable hemodynamics.  She is having nausea when coughs.  Will Tx symptomatically.   2. ESRD:  Continue with dialysis.  3. Breast Cancer with recent lumpectomy:  CT of the chest showed a large seroma vs abscess. Spoke with Dr Rosana Hoes, and felt that it is likely to be seroma.  Will have her follow up with her surgeon at [facility].  DVT prophylaxis: subQ heparin.  Code Status: FULL CODE.  Family Communication: mother at bedside.  Disposition Plan: Home.   Consultants:   Neprhology.   Procedures: dialysis.   Antimicrobials: Anti-infectives    Start     Dose/Rate Route Frequency Ordered Stop   07/22/17 1200  ceFEPIme (MAXIPIME) 2 g in dextrose 5 % 50 mL IVPB     2 g 100 mL/hr over 30 Minutes Intravenous Every M-W-F (Hemodialysis) 07/22/17 0114 07/31/17 1159   07/22/17 1200  vancomycin (VANCOCIN) IVPB 1000 mg/200 mL premix     1,000 mg 200 mL/hr over 60 Minutes Intravenous Every M-W-F (Hemodialysis) 07/22/17 1001     07/22/17 0030  vancomycin  (VANCOCIN) IVPB 1000 mg/200 mL premix     1,000 mg 200 mL/hr over 60 Minutes Intravenous Every 1 hr x 2 07/22/17 0021 07/22/17 0450   07/21/17 2345  ceFEPIme (MAXIPIME) 1 g in dextrose 5 % 50 mL IVPB     1 g 100 mL/hr over 30 Minutes Intravenous  Once 07/21/17 2341 07/22/17 0115       Subjective:  Doing well.   Objective: Vitals:   07/22/17 2105 07/22/17 2300 07/23/17 0500 07/23/17 1000  BP:   (!) 145/66 (!) 155/72  Pulse:   88 91  Resp:   20 18  Temp:  99.5 F (37.5 C) 98.7 F (37.1 C) 98.6 F (37 C)  TempSrc:  Oral Oral Oral  SpO2: 94%  92% (!) 89%  Weight:   112.9 kg (249 lb)   Height:        Intake/Output Summary (Last 24 hours) at 07/23/17 1316 Last data filed at 07/23/17 0900  Gross per 24 hour  Intake              733 ml  Output             3874 ml  Net            -3141 ml   Filed Weights   07/22/17 1100 07/22/17 1545 07/23/17 0500  Weight: 115.8 kg (255 lb 4.7 oz) 112.1 kg (247 lb 2.2 oz) 112.9 kg (249 lb)  Examination:  General exam: Appears calm and comfortable  Respiratory system: Clear to auscultation. Respiratory effort normal. Cardiovascular system: S1 & S2 heard, RRR. No JVD, murmurs, rubs, gallops or clicks. No pedal edema. Gastrointestinal system: Abdomen is nondistended, soft and nontender. No organomegaly or masses felt. Normal bowel sounds heard. Central nervous system: Alert and oriented. No focal neurological deficits. Extremities: Symmetric 5 x 5 power. Skin: No rashes, lesions or ulcers Psychiatry: Judgement and insight appear normal. Mood & affect appropriate.   Data Reviewed: I have personally reviewed following labs and imaging studies  CBC:  Recent Labs Lab 07/21/17 2131 07/22/17 0632 07/23/17 0548  WBC 10.7* 12.7* 12.7*  NEUTROABS 8.4* 11.1*  --   HGB 7.9* 7.1* 7.8*  HCT 24.4* 22.5* 25.2*  MCV 90.0 90.4 91.3  PLT 268 242 299   Basic Metabolic Panel:  Recent Labs Lab 07/21/17 2131 07/22/17 0632 07/23/17 0553  NA  142 136 136  K 4.8 5.2* 4.2  CL 102 96* 94*  CO2 26 26 28   GLUCOSE 141* 142* 147*  BUN 45* 47* 27*  CREATININE 9.64* 10.79* 7.56*  CALCIUM 9.8 8.9 8.9  PHOS  --   --  4.3   GFR: Estimated Creatinine Clearance: 11.1 mL/min (A) (by C-G formula based on SCr of 7.56 mg/dL (H)). Liver Function Tests:  Recent Labs Lab 07/21/17 2131 07/23/17 0553  AST 20  --   ALT 8*  --   ALKPHOS 80  --   BILITOT 0.8  --   PROT 7.8  --   ALBUMIN 4.0 3.7   Cardiac Enzymes:  Recent Labs Lab 07/21/17 2131  TROPONINI 0.04*   BNP (last 3 results) CBG:  Recent Labs Lab 07/22/17 1101 07/22/17 1702 07/22/17 2149 07/23/17 0752 07/23/17 1136  GLUCAP 138* 145* 153* 158* 238*   Recent Results (from the past 240 hour(s))  Culture, blood (Routine X 2) w Reflex to ID Panel     Status: None (Preliminary result)   Collection Time: 07/22/17 12:01 AM  Result Value Ref Range Status   Specimen Description RIGHT ANTECUBITAL  Final   Special Requests Blood Culture adequate volume  Final   Culture NO GROWTH 1 DAY  Final   Report Status PENDING  Incomplete  Culture, blood (Routine X 2) w Reflex to ID Panel     Status: None (Preliminary result)   Collection Time: 07/22/17 12:04 AM  Result Value Ref Range Status   Specimen Description BLOOD RIGHT HAND  Final   Special Requests Blood Culture adequate volume  Final   Culture NO GROWTH 1 DAY  Final   Report Status PENDING  Incomplete  MRSA PCR Screening     Status: None   Collection Time: 07/22/17  2:17 AM  Result Value Ref Range Status   MRSA by PCR NEGATIVE NEGATIVE Final    Comment:        The GeneXpert MRSA Assay (FDA approved for NASAL specimens only), is one component of a comprehensive MRSA colonization surveillance program. It is not intended to diagnose MRSA infection nor to guide or monitor treatment for MRSA infections.      Radiology Studies: Dg Chest 2 View  Result Date: 07/21/2017 CLINICAL DATA:  Acute onset of shortness of  breath. Decreased O2 saturation. Initial encounter. EXAM: CHEST  2 VIEW COMPARISON:  Chest radiograph performed 09/17/2016 FINDINGS: The lungs are well-aerated. Vascular congestion is noted. Hazy interstitial prominence is noted bilaterally, concerning for mild interstitial edema. Pneumonia could have a similar appearance. There  is no evidence of pleural effusion or pneumothorax. The heart is mildly enlarged. No acute osseous abnormalities are seen. IMPRESSION: Vascular congestion and mild cardiomegaly. Hazy interstitial prominence noted bilaterally, concerning for mild interstitial edema. Pneumonia could have a similar appearance. Electronically Signed   By: Garald Balding M.D.   On: 07/21/2017 21:57   Ct Angio Chest Pe W/cm &/or Wo Cm  Result Date: 07/21/2017 CLINICAL DATA:  Acute onset of shortness of breath. Decreased O2 saturation. Left breast pain and weeping, after recent left-sided lumpectomy. Initial encounter. EXAM: CT ANGIOGRAPHY CHEST WITH CONTRAST TECHNIQUE: Multidetector CT imaging of the chest was performed using the standard protocol during bolus administration of intravenous contrast. Multiplanar CT image reconstructions and MIPs were obtained to evaluate the vascular anatomy. CONTRAST:  100 mL of Isovue 370 IV contrast COMPARISON:  Chest radiograph performed earlier today at 9:34 p.m. FINDINGS: Cardiovascular: There is no evidence of significant pulmonary embolus, though evaluation for pulmonary embolus is suboptimal due to limitations in the timing of the contrast bolus. The heart is borderline enlarged. The thoracic aorta is unremarkable. The great vessels are within normal limits. Mediastinum/Nodes: Enlarged mediastinal nodes measure up to 2.0 cm in short axis, noted at the azygoesophageal recess, right paratracheal region, and aortopulmonary window. No pericardial effusion is identified. A 2.2 cm hypodensity is noted at the right thyroid lobe. No axillary lymphadenopathy is seen.  Lungs/Pleura: Patchy bilateral airspace opacities raise concern for multifocal pneumonia. Trace right-sided pleural fluid is noted. No dominant mass is identified. No pneumothorax is identified. Upper Abdomen: The visualized portions of the liver and spleen are grossly unremarkable. Musculoskeletal: A large 8.2 x 6.1 x 6.0 cm collection of fluid and air is noted at the left breast, somewhat loculated in appearance, with suggestion of mild peripheral enhancement. This may reflect an evolving abscess or possibly a complex postoperative seroma. Would correlate with the patient's symptoms. Overlying skin thickening is noted, concerning for cellulitis. No acute osseous abnormalities are identified. The visualized musculature is unremarkable in appearance. Review of the MIP images confirms the above findings. IMPRESSION: 1. No evidence of significant pulmonary embolus. 2. Patchy bilateral airspace opacities raise concern for multifocal pneumonia. Trace right-sided pleural fluid noted. 3. Large 8.2 x 6.1 x 6.0 cm collection of fluid and air at the left breast, somewhat loculated in appearance, with suggestion of mild peripheral enhancement. This may reflect an evolving abscess or possibly a complex postoperative seroma. Would correlate with the patient's symptoms. Overlying skin thickening is concerning for cellulitis. 4. Enlarged mediastinal nodes measure up to 2.0 cm in short axis. This may reflect the underlying infection. 5. **An incidental finding of potential clinical significance has been found. 2.2 cm hypodensity at the right thyroid lobe. Consider further evaluation with thyroid ultrasound. If patient is clinically hyperthyroid, consider nuclear medicine thyroid uptake and scan.** 6. Borderline cardiomegaly. Electronically Signed   By: Garald Balding M.D.   On: 07/21/2017 23:30    Scheduled Meds: . cinacalcet  30 mg Oral Q supper  . feeding supplement (NEPRO CARB STEADY)  237 mL Oral BID BM  . ferrous  sulfate  325 mg Oral BID WC  . furosemide  40 mg Oral QPM  . furosemide  80 mg Oral q morning - 10a  . insulin aspart  0-9 Units Subcutaneous TID WC  . labetalol  200 mg Oral BID  . lubiprostone  24 mcg Oral BID WC  . multivitamin  1 tablet Oral Daily  . NIFEdipine  60 mg Oral Daily  .  pantoprazole  40 mg Oral Daily  . risperiDONE  1 mg Oral BID  . sevelamer carbonate  1,600 mg Oral TID WC  . sodium chloride flush  3 mL Intravenous Q12H   Continuous Infusions: . sodium chloride    . sodium chloride    . sodium chloride    . ceFEPime (MAXIPIME) IV Stopped (07/22/17 1535)  . vancomycin Stopped (07/22/17 1505)     LOS: 2 days   Granger Chui, MD FACP Hospitalist.   If 7PM-7AM, please contact night-coverage [email or web address] Password [email or web address] 07/23/2017, 1:16 PM

## 2017-07-23 NOTE — Progress Notes (Signed)
Subjective: Interval History: has complaints of still some exertional dyspnea but overall feeling better. She denies any difficulty breathing or orthopnea. Patient still has some weakness..  Objective: Vital signs in last 24 hours: Temp:  [98.5 F (36.9 C)-101 F (38.3 C)] 98.7 F (37.1 C) (08/07 0500) Pulse Rate:  [83-96] 88 (08/07 0500) Resp:  [16-20] 20 (08/07 0500) BP: (145-210)/(53-100) 145/66 (08/07 0500) SpO2:  [91 %-99 %] 92 % (08/07 0500) Weight:  [112.1 kg (247 lb 2.2 oz)-115.8 kg (255 lb 4.7 oz)] 112.9 kg (249 lb) (08/07 0500) Weight change: 2.854 kg (6 lb 4.7 oz)  Intake/Output from previous day: 08/06 0701 - 08/07 0700 In: 853 [P.O.:600; I.V.:3; IV Piggyback:250] Out: 3382  Intake/Output this shift: No intake/output data recorded.  General appearance: alert, cooperative and no distress Resp: diminished breath sounds bilaterally Cardio: regular rate and rhythm Extremities: extremities normal, atraumatic, no cyanosis or edema  Lab Results:  Recent Labs  07/22/17 0632 07/23/17 0548  WBC 12.7* 12.7*  HGB 7.1* 7.8*  HCT 22.5* 25.2*  PLT 242 295   BMET:  Recent Labs  07/22/17 0632 07/23/17 0553  NA 136 136  K 5.2* 4.2  CL 96* 94*  CO2 26 28  GLUCOSE 142* 147*  BUN 47* 27*  CREATININE 10.79* 7.56*  CALCIUM 8.9 8.9   No results for input([name]): PTH in the last 72 hours. Iron Studies: No results for input([name]): IRON, TIBC, TRANSFERRIN, FERRITIN in the last 72 hours.  Studies/Results: Dg Chest 2 View  Result Date: 07/21/2017 CLINICAL DATA:  Acute onset of shortness of breath. Decreased O2 saturation. Initial encounter. EXAM: CHEST  2 VIEW COMPARISON:  Chest radiograph performed 09/17/2016 FINDINGS: The lungs are well-aerated. Vascular congestion is noted. Hazy interstitial prominence is noted bilaterally, concerning for mild interstitial edema. Pneumonia could have a similar appearance. There is no evidence of pleural effusion or pneumothorax. The heart is  mildly enlarged. No acute osseous abnormalities are seen. IMPRESSION: Vascular congestion and mild cardiomegaly. Hazy interstitial prominence noted bilaterally, concerning for mild interstitial edema. Pneumonia could have a similar appearance. Electronically Signed   By: Garald Balding M.D.   On: 07/21/2017 21:57   Ct Angio Chest Pe W/cm &/or Wo Cm  Result Date: 07/21/2017 CLINICAL DATA:  Acute onset of shortness of breath. Decreased O2 saturation. Left breast pain and weeping, after recent left-sided lumpectomy. Initial encounter. EXAM: CT ANGIOGRAPHY CHEST WITH CONTRAST TECHNIQUE: Multidetector CT imaging of the chest was performed using the standard protocol during bolus administration of intravenous contrast. Multiplanar CT image reconstructions and MIPs were obtained to evaluate the vascular anatomy. CONTRAST:  100 mL of Isovue 370 IV contrast COMPARISON:  Chest radiograph performed earlier today at 9:34 p.m. FINDINGS: Cardiovascular: There is no evidence of significant pulmonary embolus, though evaluation for pulmonary embolus is suboptimal due to limitations in the timing of the contrast bolus. The heart is borderline enlarged. The thoracic aorta is unremarkable. The great vessels are within normal limits. Mediastinum/Nodes: Enlarged mediastinal nodes measure up to 2.0 cm in short axis, noted at the azygoesophageal recess, right paratracheal region, and aortopulmonary window. No pericardial effusion is identified. A 2.2 cm hypodensity is noted at the right thyroid lobe. No axillary lymphadenopathy is seen. Lungs/Pleura: Patchy bilateral airspace opacities raise concern for multifocal pneumonia. Trace right-sided pleural fluid is noted. No dominant mass is identified. No pneumothorax is identified. Upper Abdomen: The visualized portions of the liver and spleen are grossly unremarkable. Musculoskeletal: A large 8.2 x 6.1 x 6.0 cm collection  of fluid and air is noted at the left breast, somewhat loculated in  appearance, with suggestion of mild peripheral enhancement. This may reflect an evolving abscess or possibly a complex postoperative seroma. Would correlate with the patient'[name] symptoms. Overlying skin thickening is noted, concerning for cellulitis. No acute osseous abnormalities are identified. The visualized musculature is unremarkable in appearance. Review of the MIP images confirms the above findings. IMPRESSION: 1. No evidence of significant pulmonary embolus. 2. Patchy bilateral airspace opacities raise concern for multifocal pneumonia. Trace right-sided pleural fluid noted. 3. Large 8.2 x 6.1 x 6.0 cm collection of fluid and air at the left breast, somewhat loculated in appearance, with suggestion of mild peripheral enhancement. This may reflect an evolving abscess or possibly a complex postoperative seroma. Would correlate with the patient'[name] symptoms. Overlying skin thickening is concerning for cellulitis. 4. Enlarged mediastinal nodes measure up to 2.0 cm in short axis. This may reflect the underlying infection. 5. **An incidental finding of potential clinical significance has been found. 2.2 cm hypodensity at the right thyroid lobe. Consider further evaluation with thyroid ultrasound. If patient is clinically hyperthyroid, consider nuclear medicine thyroid uptake and scan.** 6. Borderline cardiomegaly. Electronically Signed   By: Garald Balding M.D.   On: 07/21/2017 23:30    I have reviewed the patient'[name] current medications.  Assessment/Plan: Problem #1 difficulty breathing: The combination of pneumonia and fluid overload. Patient was dialyzed yesterday was 3.8 L fluid removal. Presently seems to be improving. Her cough is also getting better. Problem #2 anemia: Her hemoglobin is below our target goal but has improved. 7.8 today. Problem #3 bone and mineral disorder: Her calcium and phosphorus is at range. Problem #4 end-stage renal disease: She is status post hemodialysis yesterday. Her potassium  is normal and she doesn't have any uremic signs and symptoms. Problem #5 hypertension: Her blood pressure is reasonably controlled Problem #6 pneumonia: Patient is on antibiotics. She is afebrile however her white blood cell count 6 remains high. Patient says that her cough has improved and she doesn't bring up any sputum. Plan: We'll make arrangements for patient to get dialysis tomorrow We'll check renal panel and CBC in the morning.   LOS: 2 days   Patricia [name] 07/23/2017,8:28 AM

## 2017-07-24 LAB — CBC
HEMATOCRIT: 25.3 % — AB (ref 36.0–46.0)
Hemoglobin: 8 g/dL — ABNORMAL LOW (ref 12.0–15.0)
MCH: 29 pg (ref 26.0–34.0)
MCHC: 31.6 g/dL (ref 30.0–36.0)
MCV: 91.7 fL (ref 78.0–100.0)
Platelets: 298 10*3/uL (ref 150–400)
RBC: 2.76 MIL/uL — ABNORMAL LOW (ref 3.87–5.11)
RDW: 14.6 % (ref 11.5–15.5)
WBC: 12.1 10*3/uL — ABNORMAL HIGH (ref 4.0–10.5)

## 2017-07-24 LAB — RENAL FUNCTION PANEL
ALBUMIN: 3.7 g/dL (ref 3.5–5.0)
Anion gap: 13 (ref 5–15)
BUN: 45 mg/dL — AB (ref 6–20)
CO2: 28 mmol/L (ref 22–32)
Calcium: 8.9 mg/dL (ref 8.9–10.3)
Chloride: 93 mmol/L — ABNORMAL LOW (ref 101–111)
Creatinine, Ser: 10.37 mg/dL — ABNORMAL HIGH (ref 0.44–1.00)
GFR calc Af Amer: 5 mL/min — ABNORMAL LOW (ref >60)
GFR calc non Af Amer: 4 mL/min — ABNORMAL LOW (ref >60)
GLUCOSE: 166 mg/dL — AB (ref 65–99)
PHOSPHORUS: 4.9 mg/dL — AB (ref 2.5–4.6)
POTASSIUM: 4.8 mmol/L (ref 3.5–5.1)
Sodium: 134 mmol/L — ABNORMAL LOW (ref 135–145)

## 2017-07-24 LAB — GLUCOSE, CAPILLARY
GLUCOSE-CAPILLARY: 165 mg/dL — AB (ref 65–99)
Glucose-Capillary: 157 mg/dL — ABNORMAL HIGH (ref 65–99)

## 2017-07-24 MED ORDER — SODIUM CHLORIDE 0.9 % IV SOLN: 100.0000 mL | INTRAVENOUS | Status: DC | PRN

## 2017-07-24 MED ORDER — CEFTAZIDIME 2 G IJ SOLR: 2.0000 g | INTRAMUSCULAR | Status: AC

## 2017-07-24 MED ORDER — DEXTROSE 5 % IV SOLN
2.0000 g | INTRAVENOUS | Status: DC
  Administered 2017-07-24: 2 g via INTRAVENOUS
  Filled 2017-07-24: qty 2

## 2017-07-24 NOTE — Progress Notes (Signed)
Patricia [name]  MRN: [id number]  DOB/AGE: 48-Jun-1972 48 y.o.  Primary Care Physician:Jackson, Hulen Shouts, PA-C  Admit date: 07/21/2017  Chief Complaint:  Chief Complaint  Patient presents with  . Shortness of Breath    S-Pt presented on  07/21/2017 with  Chief Complaint  Patient presents with  . Shortness of Breath  .    Pt today feels better.Pt seen on HD. Pt tolerating tx well   Meds . cinacalcet  30 mg Oral Q supper  . feeding supplement (NEPRO CARB STEADY)  237 mL Oral BID BM  . ferrous sulfate  325 mg Oral BID WC  . furosemide  40 mg Oral QPM  . furosemide  80 mg Oral q morning - 10a  . insulin aspart  0-9 Units Subcutaneous TID WC  . labetalol  200 mg Oral BID  . lubiprostone  24 mcg Oral BID WC  . multivitamin  1 tablet Oral Daily  . NIFEdipine  60 mg Oral Daily  . pantoprazole  40 mg Oral Daily  . risperiDONE  1 mg Oral BID  . sevelamer carbonate  1,600 mg Oral TID WC  . sodium chloride flush  3 mL Intravenous Q12H     Physical Exam: Vital signs in last 24 hours: Temp:  [98.3 F (36.8 C)-98.5 F (36.9 C)] 98.3 F (36.8 C) (08/08 0920) Pulse Rate:  [86-90] 86 (08/08 1030) Resp:  [18] 18 (08/08 0920) BP: (131-159)/(55-83) 147/77 (08/08 1030) SpO2:  [87 %-95 %] 92 % (08/08 0921) Weight:  [250 lb 1.6 oz (113.4 kg)-250 lb 3.6 oz (113.5 kg)] 250 lb 3.6 oz (113.5 kg) (08/08 0921) Weight change: -5 lb 3.1 oz (-2.355 kg) Last BM Date: 07/23/17  Intake/Output from previous day: 08/07 0701 - 08/08 0700 In: 240 [P.O.:240] Out: -  No intake/output data recorded.   Physical Exam: General- pt is awake,alert, oriented to time place and person Resp- No acute REsp distress, CTA B/L NO Rhonchi CVS- S1S2 regular in rate and rhythm GIT- BS+, soft, NT, ND EXT- NO LE Edema, Cyanosis Access- Left AVF  Lab Results: CBC  Recent Labs  07/23/17 1353 07/24/17 0606  WBC 11.9* 12.1*  HGB 7.8* 8.0*  HCT 24.5* 25.3*  PLT 281 298    BMET  Recent Labs   07/23/17 0553 07/24/17 0609  NA 136 134*  K 4.2 4.8  CL 94* 93*  CO2 28 28  GLUCOSE 147* 166*  BUN 27* 45*  CREATININE 7.56* 10.37*  CALCIUM 8.9 8.9    MICRO Recent Results (from the past 240 hour(s))  Culture, blood (Routine X 2) w Reflex to ID Panel     Status: None (Preliminary result)   Collection Time: 07/22/17 12:01 AM  Result Value Ref Range Status   Specimen Description RIGHT ANTECUBITAL  Final   Special Requests Blood Culture adequate volume  Final   Culture NO GROWTH 2 DAYS  Final   Report Status PENDING  Incomplete  Culture, blood (Routine X 2) w Reflex to ID Panel     Status: None (Preliminary result)   Collection Time: 07/22/17 12:04 AM  Result Value Ref Range Status   Specimen Description BLOOD RIGHT HAND  Final   Special Requests Blood Culture adequate volume  Final   Culture NO GROWTH 2 DAYS  Final   Report Status PENDING  Incomplete  MRSA PCR Screening     Status: None   Collection Time: 07/22/17  2:17 AM  Result Value Ref Range Status   MRSA by  PCR NEGATIVE NEGATIVE Final    Comment:        The GeneXpert MRSA Assay (FDA approved for NASAL specimens only), is one component of a comprehensive MRSA colonization surveillance program. It is not intended to diagnose MRSA infection nor to guide or monitor treatment for MRSA infections.       Lab Results  Component Value Date   CALCIUM 8.9 07/24/2017   CAION 0.97 (L) 09/27/2016   PHOS 4.9 (H) 07/24/2017          Impression: 1)Renal  ESRD on HD                Pt is on Mon/Wed/Fri schedule                Pt will be dialyzed today  2)HTN   BP  at goal          Medication-  On Diuretics-Lasix On Calcium Channel Blockers On Alpha and beta Blockers    3)Anemia IN ESRD the goal for  HGb 9--11 HGb is not at goal    4)CKD Mineral-Bone Disorder  Phosphorus at goal. Calcium is  at goal.  5)REsp - admitted with Dyspnea Pneumonia and Fluid overload    6)Electrolytes normokalemic   Hyponatremic   Sec to ESRD  7)Acid base Co2 at goal      Plan:  Will dialyze today Will use 2 k bath  Addendum Pt seen on HD   Mattison Golay S 07/24/2017, 10:37 AM

## 2017-07-24 NOTE — Procedures (Signed)
    HEMODIALYSIS TREATMENT NOTE:  4.5 hour heparin-free dialysis completed via left upper arm AVF (15g/antegrade). Goal met: 3.5 liters removed without interruption in ultrafiltration.  All blood was returned and hemostasis was achieved within 15 minutes.  Report called to Sharyn Blitz, RN.  Rockwell Alexandria, RN, CDN

## 2017-07-24 NOTE — Progress Notes (Signed)
Pt discharged home today per Dr. Hernandez. Pt's IV site D/C'd and WDL. Pt's VSS. Pt provided with home medication list, discharge instructions and prescriptions. Verbalized understanding. Pt left floor via WC in stable condition accompanied by NT. 

## 2017-07-24 NOTE — Progress Notes (Signed)
SATURATION QUALIFICATIONS: (This note is used to comply with regulatory documentation for home oxygen)  Patient Saturations on Room Air at Rest = 87%  Patient Saturations on Room Air while Ambulating = 84%  Patient Saturations on 2 Liters of oxygen while Ambulating = 97%  Please briefly explain why patient needs home oxygen: Pt will require home oxygen at discharge because she desaturate on room air with ambulation.

## 2017-07-24 NOTE — Care Management (Signed)
CM discussed IV antibiotics with [facility]. They need DC summary with antibiotic specifics and they will arrange. CM will fax DC summary when available.

## 2017-07-24 NOTE — Discharge Summary (Addendum)
Physician Discharge Summary  Patricia [name] ZOX:[id number] DOB: 08/20/1971 DOA: 07/21/2017  PCP: Jake Samples, PA-C  Admit date: 07/21/2017 Discharge date: 07/24/2017  Time spent: 45 minutes  Recommendations for Outpatient Follow-up:  -Will be discharged home today. -To continue Fortaz with dialysis until 8/13 for treatment of healthcare associated pneumonia.   Discharge Diagnoses:  Principal Problem:   HCAP (healthcare-associated pneumonia) Active Problems:   ESRD on dialysis ([place])   Diabetes mellitus ([place])   Essential hypertension   Acute respiratory failure with hypoxia ([place])   Breast cancer ([place]) Chronic Diastolic CHF  Discharge Condition: Stable and improved  Filed Weights   07/23/17 0500 07/24/17 0633 07/24/17 0921  Weight: 112.9 kg (249 lb) 113.4 kg (250 lb 1.6 oz) 113.5 kg (250 lb 3.6 oz)    History of present illness:  As per Dr. Shanon Brow on 8/6: Patricia [name] is a 46 y.o. female with medical history significant of ESRD (secondary to HTN and DM) on dialysis normally MWF, obesity, IDDM , recent diagnosis of breast cancer unknown type or stage s/p lumpectomy of left lower breast at [facility] Friday (07/19/17) was briefly intubated for procedure comes in with worsening sob, fever, hemoptysis, productive cough.  Pt reports she did not get dialysis on Friday due to her procedure but it was arranged for her to have a session on Saturday instead.  At dialysis on sat they started her on empiric abx for pna and she was advised if she got worse to come to ED.  Her sob got worse and so she came to ED.  Found to be hypoxic around 86% on RA with ambulation.  Pt does not require supplemental o2 at home.  She denies any swelling or edema or pain to her legs.  She is suppose to start radiation to her breast soon.  She was on the renal transplant at [facility] but has been taken off the list now due to this new dx of cancer and she has to be cancer free for 5 years before being reconsidered for  transplant.  Pt found to have bilateral pna and referred for admission for aggressive iv antibiotic treatment. Unknown what abx she got at dialysis Saturday  Hospital Course:   Hospital-acquired pneumonia/multilobar pneumonia/acute hypoxemic respiratory failure -Clinically improved, culture data remains negative to date. -Still with oxygen requirement, will discharge home on oxygen. -MRSA PCR was negative hence will discontinue vancomycin. Will continue Fortaz with dialysis for total of 8 days to be completed on 8/13.  End-stage renal disease -Has been dialyzed on schedule while in the hospital.  Breast cancer with recent lumpectomy -CT scan of the chest showed a large seroma versus abscess. My partner had discussed with the surgeon on call Dr. Rosana Hoes, who felt that it was likely a seroma and did not need surgical intervention at present. -She has been advised to follow-up with her breast surgeon at [facility].  Procedures:  Dialysis on schedule   Consultations:  Nephrology  Discharge Instructions  Discharge Instructions    Diet - low sodium heart healthy    Complete by:  As directed    Increase activity slowly    Complete by:  As directed      Allergies as of 07/24/2017      Reactions   Reglan [metoclopramide] Other (See Comments)   hallucinations hallucinations   Amlodipine Besylate Rash, Other (See Comments)   dizziness      Medication List    STOP taking these medications  ALPRAZolam 0.5 MG tablet Commonly known as:  XANAX     TAKE these medications   acetaminophen 500 MG tablet Commonly known as:  TYLENOL Take 500 mg by mouth 3 (three) times daily as needed for moderate pain.   AMITIZA 24 MCG capsule Generic drug:  lubiprostone TAKE 1 CAPSULE (24 MCG TOTAL) BY MOUTH 2 (TWO) TIMES DAILY WITH A MEAL.   AURYXIA 1 GM 210 MG(Fe) tablet Generic drug:  ferric citrate Take 2 tablets by mouth 2 (two) times daily with a meal.   cefTAZidime 2 g in  dextrose 5 % 50 mL Inject 2 g into the vein every Monday, Wednesday, and Friday with hemodialysis.   furosemide 40 MG tablet Commonly known as:  LASIX Take 40-80 mg by mouth 2 (two) times daily. 80mg  in the morning and 40mg  at bedtime   glipiZIDE 5 MG tablet Commonly known as:  GLUCOTROL Take 5 mg by mouth 2 (two) times daily as needed. For blood sugar levels over 150   labetalol 200 MG tablet Commonly known as:  NORMODYNE Take 200 mg by mouth 2 (two) times daily.   multivitamin Tabs tablet Take 1 tablet by mouth daily.   NIFEdipine 60 MG 24 hr tablet Commonly known as:  PROCARDIA-XL/ADALAT CC Take 1 tablet (60 mg total) by mouth daily.   oxyCODONE 5 MG immediate release tablet Commonly known as:  Oxy IR/ROXICODONE Take 5 mg by mouth 3 (three) times daily as needed.   pantoprazole 20 MG tablet Commonly known as:  PROTONIX Take 20 mg by mouth daily.   RENVELA 800 MG tablet Generic drug:  sevelamer carbonate Take 1,600 mg by mouth 3 (three) times daily with meals.   rOPINIRole 2 MG 24 hr tablet Commonly known as:  REQUIP XL Take 2 mg by mouth at bedtime.   SENSIPAR 30 MG tablet Generic drug:  cinacalcet Take 30 mg by mouth daily with supper.      Allergies  Allergen Reactions  . Reglan [Metoclopramide] Other (See Comments)    hallucinations hallucinations  . Amlodipine Besylate Rash and Other (See Comments)    dizziness   Follow-up Information    Jake Samples, PA-C. Schedule an appointment as soon as possible for a visit in 2 week(s).   Specialty:  Family Medicine Contact information: [address] [place] [address] [phone]            The results of significant diagnostics from this hospitalization (including imaging, microbiology, ancillary and laboratory) are listed below for reference.    Significant Diagnostic Studies: Dg Chest 2 View  Result Date: 07/21/2017 CLINICAL DATA:  Acute onset of shortness of breath. Decreased O2  saturation. Initial encounter. EXAM: CHEST  2 VIEW COMPARISON:  Chest radiograph performed 09/17/2016 FINDINGS: The lungs are well-aerated. Vascular congestion is noted. Hazy interstitial prominence is noted bilaterally, concerning for mild interstitial edema. Pneumonia could have a similar appearance. There is no evidence of pleural effusion or pneumothorax. The heart is mildly enlarged. No acute osseous abnormalities are seen. IMPRESSION: Vascular congestion and mild cardiomegaly. Hazy interstitial prominence noted bilaterally, concerning for mild interstitial edema. Pneumonia could have a similar appearance. Electronically Signed   By: Garald Balding M.D.   On: 07/21/2017 21:57   Ct Angio Chest Pe W/cm &/or Wo Cm  Result Date: 07/21/2017 CLINICAL DATA:  Acute onset of shortness of breath. Decreased O2 saturation. Left breast pain and weeping, after recent left-sided lumpectomy. Initial encounter. EXAM: CT ANGIOGRAPHY CHEST WITH CONTRAST TECHNIQUE: Multidetector CT  imaging of the chest was performed using the standard protocol during bolus administration of intravenous contrast. Multiplanar CT image reconstructions and MIPs were obtained to evaluate the vascular anatomy. CONTRAST:  100 mL of Isovue 370 IV contrast COMPARISON:  Chest radiograph performed earlier today at 9:34 p.m. FINDINGS: Cardiovascular: There is no evidence of significant pulmonary embolus, though evaluation for pulmonary embolus is suboptimal due to limitations in the timing of the contrast bolus. The heart is borderline enlarged. The thoracic aorta is unremarkable. The great vessels are within normal limits. Mediastinum/Nodes: Enlarged mediastinal nodes measure up to 2.0 cm in short axis, noted at the azygoesophageal recess, right paratracheal region, and aortopulmonary window. No pericardial effusion is identified. A 2.2 cm hypodensity is noted at the right thyroid lobe. No axillary lymphadenopathy is seen. Lungs/Pleura: Patchy bilateral  airspace opacities raise concern for multifocal pneumonia. Trace right-sided pleural fluid is noted. No dominant mass is identified. No pneumothorax is identified. Upper Abdomen: The visualized portions of the liver and spleen are grossly unremarkable. Musculoskeletal: A large 8.2 x 6.1 x 6.0 cm collection of fluid and air is noted at the left breast, somewhat loculated in appearance, with suggestion of mild peripheral enhancement. This may reflect an evolving abscess or possibly a complex postoperative seroma. Would correlate with the patient's symptoms. Overlying skin thickening is noted, concerning for cellulitis. No acute osseous abnormalities are identified. The visualized musculature is unremarkable in appearance. Review of the MIP images confirms the above findings. IMPRESSION: 1. No evidence of significant pulmonary embolus. 2. Patchy bilateral airspace opacities raise concern for multifocal pneumonia. Trace right-sided pleural fluid noted. 3. Large 8.2 x 6.1 x 6.0 cm collection of fluid and air at the left breast, somewhat loculated in appearance, with suggestion of mild peripheral enhancement. This may reflect an evolving abscess or possibly a complex postoperative seroma. Would correlate with the patient's symptoms. Overlying skin thickening is concerning for cellulitis. 4. Enlarged mediastinal nodes measure up to 2.0 cm in short axis. This may reflect the underlying infection. 5. **An incidental finding of potential clinical significance has been found. 2.2 cm hypodensity at the right thyroid lobe. Consider further evaluation with thyroid ultrasound. If patient is clinically hyperthyroid, consider nuclear medicine thyroid uptake and scan.** 6. Borderline cardiomegaly. Electronically Signed   By: Garald Balding M.D.   On: 07/21/2017 23:30    Microbiology: Recent Results (from the past 240 hour(s))  Culture, blood (Routine X 2) w Reflex to ID Panel     Status: None (Preliminary result)   Collection  Time: 07/22/17 12:01 AM  Result Value Ref Range Status   Specimen Description RIGHT ANTECUBITAL  Final   Special Requests Blood Culture adequate volume  Final   Culture NO GROWTH 2 DAYS  Final   Report Status PENDING  Incomplete  Culture, blood (Routine X 2) w Reflex to ID Panel     Status: None (Preliminary result)   Collection Time: 07/22/17 12:04 AM  Result Value Ref Range Status   Specimen Description BLOOD RIGHT HAND  Final   Special Requests Blood Culture adequate volume  Final   Culture NO GROWTH 2 DAYS  Final   Report Status PENDING  Incomplete  MRSA PCR Screening     Status: None   Collection Time: 07/22/17  2:17 AM  Result Value Ref Range Status   MRSA by PCR NEGATIVE NEGATIVE Final    Comment:        The GeneXpert MRSA Assay (FDA approved for NASAL specimens only), is one  component of a comprehensive MRSA colonization surveillance program. It is not intended to diagnose MRSA infection nor to guide or monitor treatment for MRSA infections.      Labs: Basic Metabolic Panel:  Recent Labs Lab 07/21/17 2131 07/22/17 0632 07/23/17 0553 07/24/17 0609  NA 142 136 136 134*  K 4.8 5.2* 4.2 4.8  CL 102 96* 94* 93*  CO2 26 26 28 28   GLUCOSE 141* 142* 147* 166*  BUN 45* 47* 27* 45*  CREATININE 9.64* 10.79* 7.56* 10.37*  CALCIUM 9.8 8.9 8.9 8.9  PHOS  --   --  4.3 4.9*   Liver Function Tests:  Recent Labs Lab 07/21/17 2131 07/23/17 0553 07/24/17 0609  AST 20  --   --   ALT 8*  --   --   ALKPHOS 80  --   --   BILITOT 0.8  --   --   PROT 7.8  --   --   ALBUMIN 4.0 3.7 3.7   No results for input(s): LIPASE, AMYLASE in the last 168 hours. No results for input(s): AMMONIA in the last 168 hours. CBC:  Recent Labs Lab 07/21/17 2131 07/22/17 0632 07/23/17 0548 07/23/17 1353 07/24/17 0606  WBC 10.7* 12.7* 12.7* 11.9* 12.1*  NEUTROABS 8.4* 11.1*  --  10.0*  --   HGB 7.9* 7.1* 7.8* 7.8* 8.0*  HCT 24.4* 22.5* 25.2* 24.5* 25.3*  MCV 90.0 90.4 91.3 91.4  91.7  PLT 268 242 295 281 298   Cardiac Enzymes:  Recent Labs Lab 07/21/17 2131  TROPONINI 0.04*   BNP: BNP (last 3 results) No results for input(s): BNP in the last 8760 hours.  ProBNP (last 3 results) No results for input(s): PROBNP in the last 8760 hours.  CBG:  Recent Labs Lab 07/23/17 1136 07/23/17 1649 07/23/17 2131 07/24/17 0811 07/24/17 1114  GLUCAP 238* 221* 187* 157* 165*       Signed:  HERNANDEZ ACOSTA,ESTELA  [facility] Pager: [phone] 07/24/2017, 2:14 PM

## 2017-07-24 NOTE — Progress Notes (Signed)
[place] for [facility] Indication: pneumonia  Allergies  Allergen Reactions  . Reglan [Metoclopramide] Other (See Comments)    hallucinations hallucinations  . Amlodipine Besylate Rash and Other (See Comments)    dizziness   Patient Measurements: Height: 5\' 3"  (160 cm) Weight: 250 lb 3.6 oz (113.5 kg) IBW/kg (Calculated) : 52.4  Vital Signs: Temp: 98.3 F (36.8 C) (08/08 0920) Temp Source: Oral (08/08 0920) BP: 148/74 (08/08 1100) Pulse Rate: 86 (08/08 1100)  Labs:  Recent Labs  07/22/17 8144 07/23/17 0548 07/23/17 0553 07/23/17 1353 07/24/17 0606 07/24/17 0609  WBC 12.7* 12.7*  --  11.9* 12.1*  --   HGB 7.1* 7.8*  --  7.8* 8.0*  --   PLT 242 295  --  281 298  --   CREATININE 10.79*  --  7.56*  --   --  10.37*   Estimated Creatinine Clearance: 8.1 mL/min (A) (by C-G formula based on SCr of 10.37 mg/dL (H)).  No results for input(s): VANCOTROUGH, VANCOPEAK, VANCORANDOM, GENTTROUGH, GENTPEAK, GENTRANDOM, TOBRATROUGH, TOBRAPEAK, TOBRARND, AMIKACINPEAK, AMIKACINTROU, AMIKACIN in the last 72 hours.   Microbiology: Recent Results (from the past 720 hour(s))  Culture, blood (Routine X 2) w Reflex to ID Panel     Status: None (Preliminary result)   Collection Time: 07/22/17 12:01 AM  Result Value Ref Range Status   Specimen Description RIGHT ANTECUBITAL  Final   Special Requests Blood Culture adequate volume  Final   Culture NO GROWTH 2 DAYS  Final   Report Status PENDING  Incomplete  Culture, blood (Routine X 2) w Reflex to ID Panel     Status: None (Preliminary result)   Collection Time: 07/22/17 12:04 AM  Result Value Ref Range Status   Specimen Description BLOOD RIGHT HAND  Final   Special Requests Blood Culture adequate volume  Final   Culture NO GROWTH 2 DAYS  Final   Report Status PENDING  Incomplete  MRSA PCR Screening     Status: None   Collection Time: 07/22/17  2:17 AM  Result Value Ref Range Status   MRSA by PCR NEGATIVE  NEGATIVE Final    Comment:        The GeneXpert MRSA Assay (FDA approved for NASAL specimens only), is one component of a comprehensive MRSA colonization surveillance program. It is not intended to diagnose MRSA infection nor to guide or monitor treatment for MRSA infections.    Medical History: Past Medical History:  Diagnosis Date  . Blood transfusion without reported diagnosis   . Cancer ([place])   . Diabetes mellitus without complication ([place])   . Dialysis patient ([place])    mon, wed, friday,   . Hypertension   . Renal disorder   . Renal insufficiency    Medications:  Prescriptions Prior to Admission  Medication Sig Dispense Refill Last Dose  . acetaminophen (TYLENOL) 500 MG tablet Take 500 mg by mouth 3 (three) times daily as needed for moderate pain.   07/21/2017 at Unknown time  . ALPRAZolam (XANAX) 0.5 MG tablet Take 0.5 mg by mouth 3 (three) times daily as needed.  0 unknown  . AMITIZA 24 MCG capsule TAKE 1 CAPSULE (24 MCG TOTAL) BY MOUTH 2 (TWO) TIMES DAILY WITH A MEAL. 60 capsule 5 07/21/2017 at Unknown time  . Ferric Citrate (AURYXIA) 1 GM 210 MG(Fe) TABS Take 2 tablets by mouth 2 (two) times daily with a meal.   07/21/2017 at Unknown time  . furosemide (LASIX) 40 MG tablet  Take 40-80 mg by mouth 2 (two) times daily. 80mg  in the morning and 40mg  at bedtime   07/21/2017 at Unknown time  . glipiZIDE (GLUCOTROL) 5 MG tablet Take 5 mg by mouth 2 (two) times daily as needed. For blood sugar levels over 150   unknown  . labetalol (NORMODYNE) 200 MG tablet Take 200 mg by mouth 2 (two) times daily.   07/21/2017 at [place]  . multivitamin (RENA-VIT) TABS tablet Take 1 tablet by mouth daily.   07/21/2017 at Unknown time  . NIFEdipine (PROCARDIA-XL/ADALAT CC) 60 MG 24 hr tablet Take 1 tablet (60 mg total) by mouth daily. 30 tablet 2 07/21/2017 at Unknown time  . oxyCODONE (OXY IR/ROXICODONE) 5 MG immediate release tablet Take 5 mg by mouth 3 (three) times daily as needed.   Past Week at Unknown time   . pantoprazole (PROTONIX) 20 MG tablet Take 20 mg by mouth daily.  1 07/21/2017 at Unknown time  . rOPINIRole (REQUIP XL) 2 MG 24 hr tablet Take 2 mg by mouth at bedtime.    07/20/2017 at Unknown time  . SENSIPAR 30 MG tablet Take 30 mg by mouth daily with supper.    07/21/2017 at Unknown time  . sevelamer carbonate (RENVELA) 800 MG tablet Take 1,600 mg by mouth 3 (three) times daily with meals.    07/21/2017 at Unknown time   Scheduled:  . cinacalcet  30 mg Oral Q supper  . feeding supplement (NEPRO CARB STEADY)  237 mL Oral BID BM  . ferrous sulfate  325 mg Oral BID WC  . furosemide  40 mg Oral QPM  . furosemide  80 mg Oral q morning - 10a  . insulin aspart  0-9 Units Subcutaneous TID WC  . labetalol  200 mg Oral BID  . lubiprostone  24 mcg Oral BID WC  . multivitamin  1 tablet Oral Daily  . NIFEdipine  60 mg Oral Daily  . pantoprazole  40 mg Oral Daily  . risperiDONE  1 mg Oral BID  . sevelamer carbonate  1,600 mg Oral TID WC  . sodium chloride flush  3 mL Intravenous Q12H   Infusions:  . sodium chloride    . sodium chloride    . sodium chloride    . sodium chloride    . sodium chloride    . cefTAZidime (FORTAZ)  IV     PRN: sodium chloride, sodium chloride, sodium chloride, sodium chloride, sodium chloride, acetaminophen, HYDROcodone-homatropine, lidocaine (PF), lidocaine-prilocaine, LORazepam, ondansetron (ZOFRAN) IV, oxyCODONE-acetaminophen, pentafluoroprop-tetrafluoroeth, sodium chloride flush Anti-infectives    Start     Dose/Rate Route Frequency Ordered Stop   07/24/17 1500  cefTAZidime (FORTAZ) 2 g in dextrose 5 % 50 mL IVPB     2 g 100 mL/hr over 30 Minutes Intravenous Every M-W-F (Hemodialysis) 07/24/17 1107 07/29/17 1159   07/22/17 1200  ceFEPIme (MAXIPIME) 2 g in dextrose 5 % 50 mL IVPB  Status:  Discontinued     2 g 100 mL/hr over 30 Minutes Intravenous Every M-W-F (Hemodialysis) 07/22/17 0114 07/24/17 1103   07/22/17 1200  vancomycin (VANCOCIN) IVPB 1000 mg/200 mL  premix  Status:  Discontinued     1,000 mg 200 mL/hr over 60 Minutes Intravenous Every M-W-F (Hemodialysis) 07/22/17 1001 07/24/17 1103   07/22/17 0030  vancomycin (VANCOCIN) IVPB 1000 mg/200 mL premix     1,000 mg 200 mL/hr over 60 Minutes Intravenous Every 1 hr x 2 07/22/17 0021 07/22/17 0450   07/21/17 2345  ceFEPIme (MAXIPIME) 1  g in dextrose 5 % 50 mL IVPB     1 g 100 mL/hr over 30 Minutes Intravenous  Once 07/21/17 2341 07/22/17 0115     A/P: 46 yo dialysis patient with dx of HCAP.  Pt received 1 gram of cefepime in ED on admission.  HD schedule is MWF.  Scheduled next cefepime doses (cefepime 2 grams) for dialysis days. Starting 8/6.  Will need to be rescheduled if dialysis days change.   D/W Dr Jerilee Hoh, will switch to [facility] with dialysis sessions for 6 more days (8 days total)  Plan: Tressie Ellis 2gm IV qMWF (with dialysis sessions) Monitor labs, progress, c/s  Ena Dawley, [facility] 07/24/2017,11:07 AM

## 2017-07-25 NOTE — Care Management (Addendum)
Late entry: Patient discharged home on 07/24/2017 at 6 pm. Patient qualified for oxygen. [facility] was chosen by patient. Linda of [facility] delivered port oxygen tank to room prior to discharge. Handwritten orders and progress notes were supplied to [facility] as Epic system was down.   Patient qualifies for oxygen. Lasix has been tried and failed.

## 2017-07-27 LAB — CULTURE, BLOOD (ROUTINE X 2)
Culture: NO GROWTH
Culture: NO GROWTH
Special Requests: ADEQUATE
Special Requests: ADEQUATE

## 2017-09-18 DIAGNOSIS — D0512 Intraductal carcinoma in situ of left breast: Secondary | ICD-10-CM | POA: Diagnosis not present

## 2017-09-25 ENCOUNTER — Emergency Department (HOSPITAL_COMMUNITY): Payer: PRIVATE HEALTH INSURANCE

## 2017-09-25 ENCOUNTER — Inpatient Hospital Stay (HOSPITAL_COMMUNITY)
Admission: EM | Admit: 2017-09-25 | Discharge: 2017-09-30 | DRG: 193 | Disposition: A | Discharge status: Home / Self Care | Payer: PRIVATE HEALTH INSURANCE | Attending: Internal Medicine | Admitting: Internal Medicine

## 2017-09-25 ENCOUNTER — Encounter (HOSPITAL_COMMUNITY): Payer: Self-pay

## 2017-09-25 DIAGNOSIS — D631 Anemia in chronic kidney disease: Secondary | ICD-10-CM | POA: Diagnosis present

## 2017-09-25 DIAGNOSIS — I5033 Acute on chronic diastolic (congestive) heart failure: Secondary | ICD-10-CM | POA: Diagnosis present

## 2017-09-25 DIAGNOSIS — Y95 Nosocomial condition: Secondary | ICD-10-CM | POA: Diagnosis present

## 2017-09-25 DIAGNOSIS — Z7951 Long term (current) use of inhaled steroids: Secondary | ICD-10-CM

## 2017-09-25 DIAGNOSIS — J9600 Acute respiratory failure, unspecified whether with hypoxia or hypercapnia: Secondary | ICD-10-CM | POA: Diagnosis present

## 2017-09-25 DIAGNOSIS — Z888 Allergy status to other drugs, medicaments and biological substances status: Secondary | ICD-10-CM

## 2017-09-25 DIAGNOSIS — I10 Essential (primary) hypertension: Secondary | ICD-10-CM | POA: Diagnosis present

## 2017-09-25 DIAGNOSIS — R0902 Hypoxemia: Secondary | ICD-10-CM | POA: Diagnosis not present

## 2017-09-25 DIAGNOSIS — R110 Nausea: Secondary | ICD-10-CM | POA: Diagnosis present

## 2017-09-25 DIAGNOSIS — N186 End stage renal disease: Secondary | ICD-10-CM | POA: Diagnosis not present

## 2017-09-25 DIAGNOSIS — E8770 Fluid overload, unspecified: Secondary | ICD-10-CM | POA: Diagnosis not present

## 2017-09-25 DIAGNOSIS — T368X5A Adverse effect of other systemic antibiotics, initial encounter: Secondary | ICD-10-CM | POA: Diagnosis present

## 2017-09-25 DIAGNOSIS — I132 Hypertensive heart and chronic kidney disease with heart failure and with stage 5 chronic kidney disease, or end stage renal disease: Secondary | ICD-10-CM | POA: Diagnosis present

## 2017-09-25 DIAGNOSIS — E872 Acidosis, unspecified: Secondary | ICD-10-CM | POA: Diagnosis present

## 2017-09-25 DIAGNOSIS — J189 Pneumonia, unspecified organism: Secondary | ICD-10-CM | POA: Diagnosis not present

## 2017-09-25 DIAGNOSIS — Z853 Personal history of malignant neoplasm of breast: Secondary | ICD-10-CM

## 2017-09-25 DIAGNOSIS — J9601 Acute respiratory failure with hypoxia: Secondary | ICD-10-CM | POA: Diagnosis not present

## 2017-09-25 DIAGNOSIS — K219 Gastro-esophageal reflux disease without esophagitis: Secondary | ICD-10-CM | POA: Diagnosis present

## 2017-09-25 DIAGNOSIS — Z992 Dependence on renal dialysis: Secondary | ICD-10-CM

## 2017-09-25 DIAGNOSIS — E1122 Type 2 diabetes mellitus with diabetic chronic kidney disease: Secondary | ICD-10-CM | POA: Diagnosis present

## 2017-09-25 DIAGNOSIS — E119 Type 2 diabetes mellitus without complications: Secondary | ICD-10-CM

## 2017-09-25 DIAGNOSIS — C50919 Malignant neoplasm of unspecified site of unspecified female breast: Secondary | ICD-10-CM | POA: Diagnosis present

## 2017-09-25 DIAGNOSIS — Z7984 Long term (current) use of oral hypoglycemic drugs: Secondary | ICD-10-CM

## 2017-09-25 DIAGNOSIS — Z8249 Family history of ischemic heart disease and other diseases of the circulatory system: Secondary | ICD-10-CM

## 2017-09-25 DIAGNOSIS — Z9049 Acquired absence of other specified parts of digestive tract: Secondary | ICD-10-CM

## 2017-09-25 DIAGNOSIS — M898X9 Other specified disorders of bone, unspecified site: Secondary | ICD-10-CM | POA: Diagnosis present

## 2017-09-25 DIAGNOSIS — Z90710 Acquired absence of both cervix and uterus: Secondary | ICD-10-CM

## 2017-09-25 DIAGNOSIS — E875 Hyperkalemia: Secondary | ICD-10-CM

## 2017-09-25 DIAGNOSIS — E876 Hypokalemia: Secondary | ICD-10-CM | POA: Diagnosis present

## 2017-09-25 DIAGNOSIS — Z9010 Acquired absence of unspecified breast and nipple: Secondary | ICD-10-CM

## 2017-09-25 DIAGNOSIS — J9621 Acute and chronic respiratory failure with hypoxia: Secondary | ICD-10-CM | POA: Diagnosis present

## 2017-09-25 DIAGNOSIS — Z833 Family history of diabetes mellitus: Secondary | ICD-10-CM

## 2017-09-25 LAB — MRSA PCR SCREENING: MRSA by PCR: NEGATIVE

## 2017-09-25 LAB — LACTIC ACID, PLASMA
Lactic Acid, Venous: 3 mmol/L (ref 0.5–1.9)
Lactic Acid, Venous: 1 mmol/L (ref 0.5–1.9)

## 2017-09-25 LAB — BASIC METABOLIC PANEL
ANION GAP: 13 (ref 5–15)
BUN: 15 mg/dL (ref 6–20)
CALCIUM: 8.9 mg/dL (ref 8.9–10.3)
CO2: 26 mmol/L (ref 22–32)
Chloride: 97 mmol/L — ABNORMAL LOW (ref 101–111)
Creatinine, Ser: 5.25 mg/dL — ABNORMAL HIGH (ref 0.44–1.00)
GFR calc Af Amer: 10 mL/min — ABNORMAL LOW (ref >60)
GFR, EST NON AFRICAN AMERICAN: 9 mL/min — AB (ref >60)
GLUCOSE: 206 mg/dL — AB (ref 65–99)
Potassium: 3.4 mmol/L — ABNORMAL LOW (ref 3.5–5.1)
Sodium: 136 mmol/L (ref 135–145)

## 2017-09-25 LAB — CBC WITH DIFFERENTIAL/PLATELET
BASOS ABS: 0.1 10*3/uL (ref 0.0–0.1)
Basophils Relative: 1 %
Eosinophils Absolute: 0.4 10*3/uL (ref 0.0–0.7)
Eosinophils Relative: 4 %
HCT: 26.9 % — ABNORMAL LOW (ref 36.0–46.0)
Hemoglobin: 8.7 g/dL — ABNORMAL LOW (ref 12.0–15.0)
LYMPHS ABS: 1.7 10*3/uL (ref 0.7–4.0)
LYMPHS PCT: 15 %
MCH: 29.7 pg (ref 26.0–34.0)
MCHC: 32.3 g/dL (ref 30.0–36.0)
MCV: 91.8 fL (ref 78.0–100.0)
MONO ABS: 0.4 10*3/uL (ref 0.1–1.0)
MONOS PCT: 3 %
Neutro Abs: 8.8 10*3/uL — ABNORMAL HIGH (ref 1.7–7.7)
Neutrophils Relative %: 77 %
PLATELETS: 300 10*3/uL (ref 150–400)
RBC: 2.93 MIL/uL — ABNORMAL LOW (ref 3.87–5.11)
RDW: 14.4 % (ref 11.5–15.5)
WBC: 11.4 10*3/uL — ABNORMAL HIGH (ref 4.0–10.5)

## 2017-09-25 LAB — GLUCOSE, CAPILLARY: Glucose-Capillary: 83 mg/dL (ref 65–99)

## 2017-09-25 LAB — TROPONIN I: TROPONIN I: 0.04 ng/mL — AB (ref <0.03)

## 2017-09-25 LAB — I-STAT CG4 LACTIC ACID, ED: Lactic Acid, Venous: 2.99 mmol/L (ref 0.5–1.9)

## 2017-09-25 MED ORDER — SEVELAMER CARBONATE 800 MG PO TABS
1600.0000 mg | ORAL_TABLET | Freq: Three times a day (TID) | ORAL | Status: DC
  Administered 2017-09-26 – 2017-09-30 (×14): 1600 mg via ORAL
  Filled 2017-09-25 (×15): qty 2

## 2017-09-25 MED ORDER — CINACALCET HCL 30 MG PO TABS
60.0000 mg | ORAL_TABLET | Freq: Every evening | ORAL | Status: DC
  Administered 2017-09-26 – 2017-09-30 (×5): 60 mg via ORAL
  Filled 2017-09-25 (×7): qty 2

## 2017-09-25 MED ORDER — ONDANSETRON HCL 4 MG PO TABS: 4.0000 mg | ORAL_TABLET | Freq: Four times a day (QID) | ORAL | Status: DC | PRN

## 2017-09-25 MED ORDER — NIFEDIPINE ER OSMOTIC RELEASE 30 MG PO TB24
60.0000 mg | ORAL_TABLET | Freq: Every day | ORAL | Status: DC
  Administered 2017-09-26 – 2017-09-30 (×5): 60 mg via ORAL
  Filled 2017-09-25 (×5): qty 2

## 2017-09-25 MED ORDER — ACETAMINOPHEN 500 MG PO TABS
500.0000 mg | ORAL_TABLET | Freq: Three times a day (TID) | ORAL | Status: DC | PRN
  Administered 2017-09-29 – 2017-09-30 (×3): 500 mg via ORAL
  Filled 2017-09-25 (×3): qty 1

## 2017-09-25 MED ORDER — IPRATROPIUM-ALBUTEROL 0.5-2.5 (3) MG/3ML IN SOLN: 3.0000 mL | Freq: Four times a day (QID) | RESPIRATORY_TRACT | Status: DC | PRN

## 2017-09-25 MED ORDER — FLUTICASONE PROPIONATE 50 MCG/ACT NA SUSP
2.0000 | Freq: Every day | NASAL | Status: DC
  Administered 2017-09-26 – 2017-09-29 (×4): 2 via NASAL
  Filled 2017-09-25: qty 16

## 2017-09-25 MED ORDER — LUBIPROSTONE 24 MCG PO CAPS
24.0000 ug | ORAL_CAPSULE | Freq: Two times a day (BID) | ORAL | Status: DC
  Administered 2017-09-26 – 2017-09-30 (×7): 24 ug via ORAL
  Filled 2017-09-25 (×8): qty 1

## 2017-09-25 MED ORDER — ROPINIROLE HCL ER 2 MG PO TB24
2.0000 mg | ORAL_TABLET | Freq: Every day | ORAL | Status: DC
  Filled 2017-09-25 (×2): qty 1

## 2017-09-25 MED ORDER — IPRATROPIUM-ALBUTEROL 0.5-2.5 (3) MG/3ML IN SOLN
3.0000 mL | Freq: Once | RESPIRATORY_TRACT | Status: AC
  Administered 2017-09-25: 3 mL via RESPIRATORY_TRACT
  Filled 2017-09-25: qty 3

## 2017-09-25 MED ORDER — ALBUTEROL SULFATE (2.5 MG/3ML) 0.083% IN NEBU: 2.5000 mg | INHALATION_SOLUTION | Freq: Four times a day (QID) | RESPIRATORY_TRACT | Status: DC | PRN

## 2017-09-25 MED ORDER — ONDANSETRON HCL 4 MG/2ML IJ SOLN
4.0000 mg | Freq: Four times a day (QID) | INTRAMUSCULAR | Status: DC | PRN
  Administered 2017-09-25 – 2017-09-28 (×2): 4 mg via INTRAVENOUS
  Filled 2017-09-25 (×2): qty 2

## 2017-09-25 MED ORDER — IPRATROPIUM BROMIDE 0.02 % IN SOLN: 0.5000 mg | Freq: Four times a day (QID) | RESPIRATORY_TRACT | Status: DC | PRN

## 2017-09-25 MED ORDER — GLIPIZIDE 5 MG PO TABS
5.0000 mg | ORAL_TABLET | Freq: Two times a day (BID) | ORAL | Status: DC
  Filled 2017-09-25: qty 1

## 2017-09-25 MED ORDER — OXYCODONE HCL 5 MG PO TABS
5.0000 mg | ORAL_TABLET | Freq: Every day | ORAL | Status: DC | PRN
  Administered 2017-09-26: 5 mg via ORAL
  Filled 2017-09-25 (×3): qty 1

## 2017-09-25 MED ORDER — PANTOPRAZOLE SODIUM 20 MG PO TBEC
20.0000 mg | DELAYED_RELEASE_TABLET | Freq: Every day | ORAL | Status: DC
  Filled 2017-09-25 (×2): qty 1

## 2017-09-25 MED ORDER — LEVOFLOXACIN IN D5W 500 MG/100ML IV SOLN: 500.0000 mg | INTRAVENOUS | Status: DC

## 2017-09-25 MED ORDER — MORPHINE SULFATE (PF) 4 MG/ML IV SOLN
4.0000 mg | Freq: Once | INTRAVENOUS | Status: AC
  Administered 2017-09-25: 4 mg via INTRAVENOUS
  Filled 2017-09-25: qty 1

## 2017-09-25 MED ORDER — RENA-VITE PO TABS
1.0000 | ORAL_TABLET | Freq: Every day | ORAL | Status: DC
  Administered 2017-09-26 – 2017-09-30 (×5): 1 via ORAL
  Filled 2017-09-25 (×5): qty 1

## 2017-09-25 MED ORDER — FERRIC CITRATE 1 GM 210 MG(FE) PO TABS
420.0000 mg | ORAL_TABLET | Freq: Two times a day (BID) | ORAL | Status: DC
  Filled 2017-09-25 (×3): qty 2

## 2017-09-25 MED ORDER — MORPHINE SULFATE (PF) 2 MG/ML IV SOLN
2.0000 mg | INTRAVENOUS | Status: DC | PRN
  Administered 2017-09-25 – 2017-09-27 (×6): 2 mg via INTRAVENOUS
  Filled 2017-09-25 (×7): qty 1

## 2017-09-25 MED ORDER — FUROSEMIDE 10 MG/ML IJ SOLN
80.0000 mg | Freq: Once | INTRAMUSCULAR | Status: AC
  Administered 2017-09-25: 80 mg via INTRAVENOUS
  Filled 2017-09-25: qty 8

## 2017-09-25 MED ORDER — IOPAMIDOL (ISOVUE-370) INJECTION 76%
100.0000 mL | Freq: Once | INTRAVENOUS | Status: AC | PRN
Start: 2017-09-25 — End: 2017-09-25
  Administered 2017-09-25: 100 mL via INTRAVENOUS

## 2017-09-25 MED ORDER — LABETALOL HCL 200 MG PO TABS
200.0000 mg | ORAL_TABLET | Freq: Two times a day (BID) | ORAL | Status: DC
  Administered 2017-09-25 – 2017-09-30 (×10): 200 mg via ORAL
  Filled 2017-09-25 (×10): qty 1

## 2017-09-25 MED ORDER — LEVOFLOXACIN IN D5W 750 MG/150ML IV SOLN
750.0000 mg | Freq: Once | INTRAVENOUS | Status: AC
  Administered 2017-09-25: 750 mg via INTRAVENOUS
  Filled 2017-09-25: qty 150

## 2017-09-25 MED ORDER — ONDANSETRON HCL 4 MG/2ML IJ SOLN
4.0000 mg | Freq: Once | INTRAMUSCULAR | Status: AC
  Administered 2017-09-25: 4 mg via INTRAVENOUS
  Filled 2017-09-25: qty 2

## 2017-09-25 MED ORDER — CHLORHEXIDINE GLUCONATE 0.12 % MT SOLN
15.0000 mL | Freq: Two times a day (BID) | OROMUCOSAL | Status: DC
  Administered 2017-09-25 – 2017-09-30 (×10): 15 mL via OROMUCOSAL
  Filled 2017-09-25 (×10): qty 15

## 2017-09-25 NOTE — ED Notes (Signed)
CRITICAL VALUE ALERT  Critical Value:  Lactic Acid 3.0  Date & Time Notied:  09/25/2017 1844  Provider Notified:  Alvino Chapel  Orders Received/Actions taken: No orders received at this time

## 2017-09-25 NOTE — H&P (Signed)
History and Physical    Patricia [name] TMH:[id number] DOB: [birth year] DOA: 09/25/2017  PCP: Jake Samples, PA-C   Patient coming from: Home/HD center.  I have personally briefly reviewed patient's old medical records in [place]  Chief Complaint: Shortness of breath.  HPI: Patricia [name] is a 46 y.o. female with medical history significant of breast cancer, status post mastectomy on September 27, type 2 diabetes, ESRD on hemodialysis, hypertension, GERD who is brought to the emergency department with complaints of dyspnea since yesterday, despite having hemodialysis today with a net loss of 3.2 L. She reports that she has been mildly dyspneic, however the dyspnea has worsened since yesterday evening and was unable to sleep on her bed. She slept in sitting position.. She has been feeling weak, tired with chills, coughing up grayish sputum, having mild wheezing and a sinus headache. She was told at [facility] that she had a mild pneumonia and was put on clindamycin. However, she states that her clindamycin is making her nauseous and has had 2 episodes of emesis last night and this morning after taking. She denies chest pain, palpitations, dizziness or diaphoresis. No abdominal pain, no diarrhea, no melena or hematochezia. She still urinates 2-3 times a day and denies dysuria, frequency or hematuria.  ED Course: Initial vital signs temperature 98.40F, pulse 90, respirations 20, blood pressure 177/77 mmHg and O2 sat 88% on room air. She received supplemental oxygen, Levaquin 500 milligrams IVPB, also added a DuoNeb, morphine 4 mg IVP 1 and furosemide 80 mg IVP. She received IV contrast while getting CT angiogram.  Her lab work shows WBC of 11.4, hemoglobin 10.7 g/dL and platelets 300. Her troponin was 0.04. Lactic acid was 2.99, then 3.00, then normal, then 2.1 mmol/L and normal again. Potassium was 3.4 mmol/L. BUN 15, creatinine 5.25 and glucose 206 mg/dL.  Imaging: Her chest radiograph  did not show any acute findings. CT angiogram of the chest did not find pulmonary embolism, profound groundglass infiltration consistent with edema and CHF. There was also patchy bibasilar airspace disease likely atelectasis. Please see images and full radiology reports for further detail.  Review of Systems: As per HPI otherwise 10 point review of systems negative.    Past Medical History:  Diagnosis Date  . Blood transfusion without reported diagnosis   . Cancer ([place])   . Diabetes mellitus without complication ([place])   . Dialysis patient ([place])    mon, wed, friday,   . Hypertension   . Renal disorder   . Renal insufficiency     Past Surgical History:  Procedure Laterality Date  . ABDOMINAL HYSTERECTOMY    . AV FISTULA PLACEMENT  11/2014   at [facility] N/A 07/10/2016   Procedure: BALLOON DILATION;  Surgeon: Danie Binder, MD;  Location: [facility];  Service: Endoscopy;  Laterality: N/A;  Pyloric dilation  . BREAST LUMPECTOMY    . CESAREAN SECTION    . CHOLECYSTECTOMY    . COLONOSCOPY WITH PROPOFOL N/A 09/27/2016   Procedure: COLONOSCOPY WITH PROPOFOL;  Surgeon: Daneil Dolin, MD;  Location: [facility];  Service: Endoscopy;  Laterality: N/A;  2:00  . DILATION AND CURETTAGE OF UTERUS    . ESOPHAGOGASTRODUODENOSCOPY N/A 07/10/2016   Dr.Fields- normal esophagus, gastric stenosis was found at the pylorus, gastritis on bx, normal examined duodenun  . MASTECTOMY     left sided     reports that she has never smoked. She has never used smokeless tobacco.  She reports that she does not drink alcohol or use drugs.  Allergies  Allergen Reactions  . Reglan [Metoclopramide] Other (See Comments)    hallucinations hallucinations  . Amlodipine Besylate Rash and Other (See Comments)    dizziness    Family History  Problem Relation Age of Onset  . Diabetes Mellitus II Mother   . Hypertension Mother   . Hypertension Sister   . Hypertension Sister   . Colon  cancer Neg Hx     Prior to Admission medications   Medication Sig Start Date End Date Taking? Authorizing Provider  acetaminophen (TYLENOL) 500 MG tablet Take 500 mg by mouth 3 (three) times daily as needed for moderate pain.   Yes [provider]  AMITIZA 24 MCG capsule TAKE 1 CAPSULE (24 MCG TOTAL) BY MOUTH 2 (TWO) TIMES DAILY WITH A MEAL. 05/14/17  Yes Carlis Stable, NP  chlorhexidine (PERIDEX) 0.12 % solution Swish and spit 15 mLs 2 (two) times daily.   Yes [provider]  clindamycin (CLEOCIN) 300 MG capsule Take 300 mg by mouth 3 (three) times daily. 10 day course starting on 09/14/2017 09/14/17  Yes [provider]  Ferric Citrate (AURYXIA) 1 GM 210 MG(Fe) TABS Take 2 tablets by mouth 2 (two) times daily with a meal.   Yes [provider]  fluticasone (FLONASE) 50 MCG/ACT nasal spray Place 2 sprays into both nostrils daily. LEFT NARE ONLY 08/08/17  Yes [provider]  furosemide (LASIX) 40 MG tablet Take 40-80 mg by mouth 2 (two) times daily. 69m in the morning and 411mat bedtime   Yes [provider]  glipiZIDE (GLUCOTROL) 5 MG tablet Take 5 mg by mouth 2 (two) times daily as needed. For blood sugar levels over 150   Yes [provider]  labetalol (NORMODYNE) 200 MG tablet Take 200 mg by mouth 2 (two) times daily.   Yes [provider]  multivitamin (RENA-VIT) TABS tablet Take 1 tablet by mouth daily.   Yes [provider]  NIFEdipine (PROCARDIA-XL/ADALAT CC) 60 MG 24 hr tablet Take 1 tablet (60 mg total) by mouth daily. 12/06/15  Yes HeIsaac BlissEsRayford HalstedMD  ondansetron (ZOFRAN-ODT) 4 MG disintegrating tablet Take 4 mg by mouth every 8 (eight) hours as needed for nausea or vomiting.   Yes [provider]  pantoprazole (PROTONIX) 20 MG tablet Take 20 mg by mouth daily. 06/28/17  Yes [provider]  rOPINIRole (REQUIP XL) 2 MG 24 hr tablet Take 2 mg by mouth at bedtime.  09/10/16  Yes  [provider]  SENSIPAR 60 MG tablet Take 60 mg by mouth every evening. 09/19/17  Yes [provider]  sevelamer carbonate (RENVELA) 800 MG tablet Take 1,600 mg by mouth 3 (three) times daily with meals.    Yes [provider]  oxyCODONE (OXY IR/ROXICODONE) 5 MG immediate release tablet Take 5 mg by mouth daily as needed for moderate pain (patient has on hand but does not take).  09/14/17   [provider]    Physical Exam: Vitals:   09/25/17 1459 09/25/17 1501 09/25/17 1507 09/25/17 1922  BP:  (!) 177/77  (!) 146/67  Pulse:  90  76  Resp:  20  19  Temp:  98.8 F (37.1 C)    TempSrc:  Oral    SpO2:  (!) 88% 99% 98%  Weight: 111.1 kg (245 lb)     Height: 5' 3"  (1.6 m)  Constitutional: NAD, calm, comfortable Eyes: PERRL, lids and conjunctivae normal ENMT: Mucous membranes are moist. Posterior pharynx clear of any exudate or lesions. Neck: normal, supple, no masses, no thyromegaly Respiratory: Decreased breath sounds with crackles on bases. Normal respiratory effort. No accessory muscle use.  Cardiovascular: Regular rate and rhythm, no murmurs / rubs / gallops. 1+ lower extremities edema. 2+ pedal pulses. No carotid bruits.  Abdomen: no tenderness, no masses palpated. No hepatosplenomegaly. Bowel sounds positive.  Musculoskeletal: no clubbing / cyanosis. Good ROM, no contractures. Normal muscle tone.  Skin: no significant rashes, lesions, ulcers on limited skin exam. Neurologic: CN 2-12 grossly intact. Sensation intact, DTR normal. Strength 5/5 in all 4.  Psychiatric: Normal judgment and insight. Alert and oriented x 4. Normal mood.    Labs on Admission: I have personally reviewed following labs and imaging studies  CBC:  Recent Labs Lab 09/25/17 1510  WBC 11.4*  NEUTROABS 8.8*  HGB 8.7*  HCT 26.9*  MCV 91.8  PLT 387   Basic Metabolic Panel:  Recent Labs Lab 09/25/17 1510  NA 136  K 3.4*  CL 97*  CO2 26  GLUCOSE 206*  BUN  15  CREATININE 5.25*  CALCIUM 8.9   GFR: Estimated Creatinine Clearance: 15.9 mL/min (A) (by C-G formula based on SCr of 5.25 mg/dL (H)). Liver Function Tests: No results for input(s): AST, ALT, ALKPHOS, BILITOT, PROT, ALBUMIN in the last 168 hours. No results for input(s): LIPASE, AMYLASE in the last 168 hours. No results for input(s): AMMONIA in the last 168 hours. Coagulation Profile: No results for input(s): INR, PROTIME in the last 168 hours. Cardiac Enzymes:  Recent Labs Lab 09/25/17 1514  TROPONINI 0.04*   BNP (last 3 results) No results for input(s): PROBNP in the last 8760 hours. HbA1C: No results for input(s): HGBA1C in the last 72 hours. CBG: No results for input(s): GLUCAP in the last 168 hours. Lipid Profile: No results for input(s): CHOL, HDL, LDLCALC, TRIG, CHOLHDL, LDLDIRECT in the last 72 hours. Thyroid Function Tests: No results for input(s): TSH, T4TOTAL, FREET4, T3FREE, THYROIDAB in the last 72 hours. Anemia Panel: No results for input(s): VITAMINB12, FOLATE, FERRITIN, TIBC, IRON, RETICCTPCT in the last 72 hours. Urine analysis:    Component Value Date/Time   COLORURINE YELLOW 09/14/2016 1915   APPEARANCEUR CLEAR 09/14/2016 1915   LABSPEC 1.015 09/14/2016 1915   PHURINE 7.5 09/14/2016 1915   GLUCOSEU >1000 (A) 09/14/2016 1915   HGBUR TRACE (A) 09/14/2016 1915   BILIRUBINUR NEGATIVE 09/14/2016 [place] NEGATIVE 09/14/2016 1915   PROTEINUR >300 (A) 09/14/2016 1915   NITRITE NEGATIVE 09/14/2016 1915   LEUKOCYTESUR NEGATIVE 09/14/2016 1915    Radiological Exams on Admission: Dg Chest 2 View  Result Date: 09/25/2017 CLINICAL DATA:  Shortness of breath and cough for 2 days. EXAM: CHEST  2 VIEW COMPARISON:  PA and lateral chest and CT chest 07/21/2017. FINDINGS: The lungs are clear. Heart size is normal. No pneumothorax or pleural fluid. No acute bony abnormality. Surgical clips left breast are identified. IMPRESSION: No acute disease.  Electronically Signed   By: Inge Rise M.D.   On: 09/25/2017 15:43   Ct Angio Chest Pe W And/or Wo Contrast  Result Date: 09/25/2017 CLINICAL DATA:  Cough and shortness of breath. Breast cancer with recent mastectomy. Recent pneumonia. PE suspected, high pretest probability. EXAM: CT ANGIOGRAPHY CHEST WITH CONTRAST TECHNIQUE: Multidetector CT imaging of the chest was performed using the standard protocol during bolus administration of intravenous contrast. Multiplanar CT  image reconstructions and MIPs were obtained to evaluate the vascular anatomy. CONTRAST:  100 mL Isovue 370 COMPARISON:  CTA chest 07/21/2017 FINDINGS: Cardiovascular: The heart is enlarged. Minimal pericardial fluid is likely physiologic. Minimal atherosclerotic changes are noted at the aortic arch without aneurysm or focal stenosis. Pulmonary artery opacification is excellent. No focal filling defects are present to suggest pulmonary embolus. Mediastinum/Nodes: Peritracheal and subcarinal adenopathy is stable. No significant hilar adenopathy is present. No significant axillary adenopathy is present. Lungs/Pleura: Patchy bilateral airspace disease is present bilaterally, right greater than left. There is diffuse ground-glass attenuation. No significant effusions are present. Upper Abdomen: The patient is status post cholecystectomy. Musculoskeletal: Vertebral body heights and alignment are normal. No focal lytic or blastic lesions are present. The ribs are unremarkable. Previous fluid collection in the left breast has been drained. Surgical drains remain in place. Review of the MIP images confirms the above findings. IMPRESSION: 1. No evidence pulmonary embolus. 2. Diffuse ground-glass attenuation likely reflecting edema and congestive heart failure. 3. Patchy bibasilar airspace disease likely reflects atelectasis. Infection is considered less likely. 4. Mediastinal adenopathy is stable, likely reactive. 5. Interval drainage of left  breast fluid collection. Electronically Signed   By: San Morelle M.D.   On: 09/25/2017 18:31  07/05/2016 echocardiogram ------------------------------------------------------------------- LV EF: 60% -   65%  ------------------------------------------------------------------- Indications:      Chest pain 786.51.  ------------------------------------------------------------------- History:   PMH:  ESRD on dialysis.  Risk factors:  Hypertension. Diabetes mellitus.  ------------------------------------------------------------------- Study Conclusions  - Left ventricle: The cavity size was normal. Wall thickness was   increased in a pattern of mild LVH. Systolic function was normal.   The estimated ejection fraction was in the range of 60% to 65%.   Wall motion was normal; there were no regional wall motion   abnormalities. Features are consistent with a pseudonormal left   ventricular filling pattern, with concomitant abnormal relaxation   and increased filling pressure (grade 2 diastolic dysfunction). - Aortic valve: Mildly calcified annulus. Trileaflet. Mean gradient   (S): 6 mm Hg. Valve area (VTI): 2.23 cm^2. Valve area (Vmax):   2.41 cm^2. - Mitral valve: Calcified annulus. There was trivial regurgitation. - Left atrium: The atrium was mildly dilated. - Right atrium: Central venous pressure (est): 3 mm Hg. - Tricuspid valve: There was physiologic regurgitation. - Pulmonary arteries: PA peak pressure: 23 mm Hg (S). - Pericardium, extracardiac: There was no pericardial effusion.  Impressions:  - Mild LVH with LVEF 60-65%. Grade 2 diastolic dysfunction with   increased LV filling pressure. Mild left atrial enlargement. MAC   with trivial mitral regurgitation. Mildly sclerotic aortic valve   without stenosis. Normal estimated PASP 23 mmHg.  EKG: Independently reviewed.  Vent. rate 91 BPM PR interval * ms QRS duration 107 ms QT/QTc 380/468 ms P-R-T axes 102 81  -57 Sinus rhythm Atrial premature complex Consider left atrial enlargement Incomplete left bundle branch block  Assessment/Plan Principal Problem:   Volume overload   Acute on chronic diastolic CHF (congestive heart failure) (HCC) Observation/stepdown. Continue supplemental oxygen. Furosemide 80 mg IVP 1 dose given. Monitor intake and output. Check echocardiogram in a.m.  Active Problems:   CAP (community acquired pneumonia) Continue supplemental oxygen. Bronchodilators as needed. The patient is complaining of nausea due to clindamycin. Will switch to Levaquin IVPB while in the hospital. Check sputum Gram stain, culture and sensitivity if possible. Check strep pneumonia urinary antigen. Incentive spirometry.    ESRD on dialysis [facility]) Had dialysis earlier today.  Had CT angiogram with IV contrast. Continue Renvela and Sensipar. Consult nephrology in a.m.    Diabetes mellitus (HCC) Carbohydrate modified diet. Continue glipizide 5 mg by mouth daily. CBG monitoring with regular insulin sliding scale.    Essential hypertension Resume labetalol 200 mg by mouth twice a day in the morning. Continue nifedipine XL 60 mg by mouth daily. Monitor blood pressure.    GERD (gastroesophageal reflux disease) Protonix 40 mg by mouth daily.    Breast cancer ([place]) Status post mastectomy about 2 weeks ago complicated by surgical site hematoma. Continue analgesics as needed. Continue treatment and follow-ups per oncology.    Lactic acidosis Resolved.    Hypokalemia Given ESRD status will monitor closely. No replacement in order.    Anemia Monitor hematocrit and hemoglobin. Continue iron supplementation. Erythropoietin injections per nephrology.  DVT prophylaxis: SCDs. Code Status: Full code. Family Communication:  Disposition Plan: Admit for further treatment and nephrology evaluation in the morning. Consults called: Routine nephrology consult in a.m. Admission status:  Observation/stepdown.   Reubin Milan MD [facility] Pager [phone].  If 7PM-7AM, please contact night-coverage [email or web address] Password [email or web address]  09/25/2017, 8:17 PM   This document was prepared using Dragon voice recognition software and may contain some unintended errors.

## 2017-09-25 NOTE — ED Triage Notes (Signed)
Pt c/o SOB since yesterday.  Pt had dialysis today and saw was told they pulled 3.2liters off today.  Pt has history of breast cancer and had mastectomy sept 27.  Reports she had a hematoma and they had to go back in and "take care of that."  Says when she was discharged they told her she had a touch of pneumonia and was given clindamycin to take at home.  Pt had her procedure at [facility].  Reports cough and feeling weak and tired.

## 2017-09-25 NOTE — ED Provider Notes (Signed)
[facility] DEPT Provider Note   CSN: [id number] Arrival date & time: 09/25/17  1451     History   Chief Complaint No chief complaint on file.   HPI Patricia [name] is a 46 y.o. female.  HPI Patient has a history of breast cancer. Had a mastectomy on September 27. Complicated by hematoma.also complicated by pneumonia that she's been on clindamycin for. Since then she has had some nausea and vomiting. Worsening shortness of breath the last couple days. She's had a cough with mild sputum production. No fevers. She is a dialysis patient and had dialysis Monday and today, with today being Wednesday. States he took 3.2 off today. Unsure if that is liters or pounds. She states he normally take her down below her dry weight. Had a fever 5 days ago. No real swelling in her legs. She is on dialysis for about 2 years. Past Medical History:  Diagnosis Date  . Blood transfusion without reported diagnosis   . Cancer ([place])   . Diabetes mellitus without complication ([place])   . Dialysis patient ([place])    mon, wed, friday,   . Hypertension   . Renal disorder   . Renal insufficiency     Patient Active Problem List   Diagnosis Date Noted  . Breast cancer ([place]) 07/22/2017  . SOB (shortness of breath)   . Acute respiratory failure with hypoxia ([place]) 07/21/2017  . Flatulence 10/25/2016  . GERD (gastroesophageal reflux disease) 08/02/2016  . Pyloric stenosis, acquired   . Nausea with vomiting   . Generalized abdominal pain   . Chest pain, rule out acute myocardial infarction 07/04/2016  . Constipation 07/04/2016  . Nausea 07/04/2016  . Essential hypertension 07/04/2016  . HCAP (healthcare-associated pneumonia) 12/04/2015  . ESRD on dialysis ([place]) 12/04/2015  . Diabetes mellitus ([place]) 12/04/2015    Past Surgical History:  Procedure Laterality Date  . ABDOMINAL HYSTERECTOMY    . AV FISTULA PLACEMENT  11/2014   at [place] N/A 07/10/2016   Procedure: BALLOON  DILATION;  Surgeon: Danie Binder, MD;  Location: [facility];  Service: Endoscopy;  Laterality: N/A;  Pyloric dilation  . BREAST LUMPECTOMY    . CESAREAN SECTION    . CHOLECYSTECTOMY    . COLONOSCOPY WITH PROPOFOL N/A 09/27/2016   Procedure: COLONOSCOPY WITH PROPOFOL;  Surgeon: Daneil Dolin, MD;  Location: [facility] END[facility] SUITE;  Service: Endoscopy;  Laterality: N/A;  2:00  . DILATION AND CURETTAGE OF UTERUS    . ESOPHAGOGASTRODUODENOSCOPY N/A 07/10/2016   Dr.Fields- normal esophagus, gastric stenosis was found at the pylorus, gastritis on bx, normal examined duodenun  . MASTECTOMY     left sided    OB History    Gravida Para Term Preterm AB Living   3 2 1 1 1      SAB TAB Ectopic Multiple Live Births   1               Home Medications    Prior to Admission medications   Medication Sig Start Date End Date Taking? Authorizing Provider  acetaminophen (TYLENOL) 500 MG tablet Take 500 mg by mouth 3 (three) times daily as needed for moderate pain.    [provider]  AMITIZA 24 MCG capsule TAKE 1 CAPSULE (24 MCG TOTAL) BY MOUTH 2 (TWO) TIMES DAILY WITH A MEAL. 05/14/17   Carlis Stable, NP  Ferric Citrate (AURYXIA) 1 GM 210 MG(Fe) TABS Take 2 tablets by mouth 2 (two) times daily with  a meal.    [provider]  furosemide (LASIX) 40 MG tablet Take 40-80 mg by mouth 2 (two) times daily. 80mg  in the morning and 40mg  at bedtime    [provider]  glipiZIDE (GLUCOTROL) 5 MG tablet Take 5 mg by mouth 2 (two) times daily as needed. For blood sugar levels over 150    [provider]  labetalol (NORMODYNE) 200 MG tablet Take 200 mg by mouth 2 (two) times daily.    [provider]  multivitamin (RENA-VIT) TABS tablet Take 1 tablet by mouth daily.    [provider]  NIFEdipine (PROCARDIA-XL/ADALAT CC) 60 MG 24 hr tablet Take 1 tablet (60 mg total) by mouth daily. 12/06/15   Isaac Bliss, Rayford Halsted, MD  pantoprazole (PROTONIX) 20 MG tablet Take 20  mg by mouth daily. 06/28/17   [provider]  rOPINIRole (REQUIP XL) 2 MG 24 hr tablet Take 2 mg by mouth at bedtime.  09/10/16   [provider]  SENSIPAR 30 MG tablet Take 30 mg by mouth daily with supper.  09/12/16   [provider]  sevelamer carbonate (RENVELA) 800 MG tablet Take 1,600 mg by mouth 3 (three) times daily with meals.     [provider]    Family History Family History  Problem Relation Age of Onset  . Colon cancer Neg Hx     Social History Social History  Substance Use Topics  . Smoking status: Never Smoker  . Smokeless tobacco: Never Used  . Alcohol use No     Allergies   Reglan [metoclopramide] and Amlodipine besylate   Review of Systems Review of Systems  Constitutional: Positive for appetite change. Negative for fever.  HENT: Negative for congestion.   Respiratory: Positive for shortness of breath.   Cardiovascular: Negative for chest pain.  Gastrointestinal: Positive for nausea and vomiting.  Endocrine: Negative for polyuria.  Genitourinary: Negative for dysuria.  Musculoskeletal: Negative for back pain.  Skin: Negative for rash.  Neurological: Positive for weakness. Negative for numbness.  Hematological: Negative for adenopathy.  Psychiatric/Behavioral: Negative for confusion.     Physical Exam Updated Vital Signs BP (!) 177/77 (BP Location: Right Arm)   Pulse 90   Temp 98.8 F (37.1 C) (Oral)   Resp 20   Ht 5\' 3"  (1.6 m)   Wt 111.1 kg (245 lb)   SpO2 99%   BMI 43.40 kg/m   Physical Exam  Constitutional: She appears well-developed.  HENT:  Head: Normocephalic.  Eyes: Pupils are equal, round, and reactive to light.  Neck: Neck supple.  Cardiovascular: Normal rate.   Pulmonary/Chest: She has rales.  Rales bilateral bases.hypoxic on room air. Improved on nasal O2.  Abdominal: Soft.  Musculoskeletal: She exhibits edema.  Neurological: She is alert.  Skin: Skin is warm. Capillary refill takes  less than 2 seconds.  Psychiatric: She has a normal mood and affect.     ED Treatments / Results  Labs (all labs ordered are listed, but only abnormal results are displayed) Labs Reviewed  CBC WITH DIFFERENTIAL/PLATELET - Abnormal; Notable for the following:       Result Value   WBC 11.4 (*)    RBC 2.93 (*)    Hemoglobin 8.7 (*)    HCT 26.9 (*)    Neutro Abs 8.8 (*)    All other components within normal limits  BASIC METABOLIC PANEL - Abnormal; Notable for the following:    Potassium 3.4 (*)    Chloride 97 (*)  Glucose, Bld 206 (*)    Creatinine, Ser 5.25 (*)    GFR calc non Af Amer 9 (*)    GFR calc Af Amer 10 (*)    All other components within normal limits  TROPONIN I - Abnormal; Notable for the following:    Troponin I 0.04 (*)    All other components within normal limits  LACTIC ACID, PLASMA - Abnormal; Notable for the following:    Lactic Acid, Venous 3.0 (*)    All other components within normal limits  I-STAT CG4 LACTIC ACID, ED - Abnormal; Notable for the following:    Lactic Acid, Venous 2.99 (*)    All other components within normal limits  LACTIC ACID, PLASMA    EKG  EKG Interpretation  Date/Time:  Wednesday September 25 2017 15:14:52 EDT Ventricular Rate:  91 PR Interval:    QRS Duration: 107 QT Interval:  380 QTC Calculation: 468 R Axis:   81 Text Interpretation:  Sinus rhythm Atrial premature complex Consider left atrial enlargement Incomplete left bundle branch block Confirmed by Davonna Belling [phone]) on 09/25/2017 4:45:15 PM       Radiology Dg Chest 2 View  Result Date: 09/25/2017 CLINICAL DATA:  Shortness of breath and cough for 2 days. EXAM: CHEST  2 VIEW COMPARISON:  PA and lateral chest and CT chest 07/21/2017. FINDINGS: The lungs are clear. Heart size is normal. No pneumothorax or pleural fluid. No acute bony abnormality. Surgical clips left breast are identified. IMPRESSION: No acute disease. Electronically Signed   By: Inge Rise M.D.   On: 09/25/2017 15:43   Ct Angio Chest Pe W And/or Wo Contrast  Result Date: 09/25/2017 CLINICAL DATA:  Cough and shortness of breath. Breast cancer with recent mastectomy. Recent pneumonia. PE suspected, high pretest probability. EXAM: CT ANGIOGRAPHY CHEST WITH CONTRAST TECHNIQUE: Multidetector CT imaging of the chest was performed using the standard protocol during bolus administration of intravenous contrast. Multiplanar CT image reconstructions and MIPs were obtained to evaluate the vascular anatomy. CONTRAST:  100 mL Isovue 370 COMPARISON:  CTA chest 07/21/2017 FINDINGS: Cardiovascular: The heart is enlarged. Minimal pericardial fluid is likely physiologic. Minimal atherosclerotic changes are noted at the aortic arch without aneurysm or focal stenosis. Pulmonary artery opacification is excellent. No focal filling defects are present to suggest pulmonary embolus. Mediastinum/Nodes: Peritracheal and subcarinal adenopathy is stable. No significant hilar adenopathy is present. No significant axillary adenopathy is present. Lungs/Pleura: Patchy bilateral airspace disease is present bilaterally, right greater than left. There is diffuse ground-glass attenuation. No significant effusions are present. Upper Abdomen: The patient is status post cholecystectomy. Musculoskeletal: Vertebral body heights and alignment are normal. No focal lytic or blastic lesions are present. The ribs are unremarkable. Previous fluid collection in the left breast has been drained. Surgical drains remain in place. Review of the MIP images confirms the above findings. IMPRESSION: 1. No evidence pulmonary embolus. 2. Diffuse ground-glass attenuation likely reflecting edema and congestive heart failure. 3. Patchy bibasilar airspace disease likely reflects atelectasis. Infection is considered less likely. 4. Mediastinal adenopathy is stable, likely reactive. 5. Interval drainage of left breast fluid collection. Electronically  Signed   By: San Morelle M.D.   On: 09/25/2017 18:31    Procedures Procedures (including critical care time)  Medications Ordered in ED Medications  iopamidol (ISOVUE-370) 76 % injection 100 mL (100 mLs Intravenous Contrast Given 09/25/17 1803)     Initial Impression / Assessment and Plan / ED Course  I have reviewed the triage  vital signs and the nursing notes.  Pertinent labs & imaging results that were available during my care of the patient were reviewed by me and considered in my medical decision making (see chart for details).     Patient presents with shortness of breath. Recent mastectomy. CT angiography done due to recent surgery.hypoxic in room air. Had dialysis today.Still makes some urine.Has had oxygen in the past at home but was reportedly stopped.pneumonia considered less likely  Final Clinical Impressions(s) / ED Diagnoses   Final diagnoses:  Hypoxia  End stage renal disease on dialysis [facility])    New Prescriptions New Prescriptions   No medications on file     Davonna Belling, MD 09/25/17 1902

## 2017-09-25 NOTE — Progress Notes (Signed)
Pharmacy Antibiotic Note  Patricia [name] is a 46 y.o. female admitted on 09/25/2017 with pneumonia.  Pharmacy has been consulted for Levaquin dosing.  Plan: Levofloxacin 750 mg loading dose, then 500 mg IV q48h Monitor labs, micro and vitals.   Height: 5\' 3"  (160 cm) Weight: 245 lb (111.1 kg) IBW/kg (Calculated) : 52.4  Temp (24hrs), Avg:98.8 F (37.1 C), Min:98.8 F (37.1 C), Max:98.8 F (37.1 C)   Recent Labs Lab 09/25/17 1510 09/25/17 1522 09/25/17 1804 09/25/17 2136  WBC 11.4*  --   --   --   CREATININE 5.25*  --   --   --   LATICACIDVEN  --  2.99* 3.0* 1.0    Estimated Creatinine Clearance: 15.9 mL/min (A) (by C-G formula based on SCr of 5.25 mg/dL (H)).    Allergies  Allergen Reactions  . Reglan [Metoclopramide] Other (See Comments)    hallucinations hallucinations  . Amlodipine Besylate Rash and Other (See Comments)    dizziness   Antimicrobials this admission: Levaquin 10/10 >>   Dose adjustments this admission: ESRD  Microbiology results:  BCx:   UCx:    Sputum:    MRSA PCR:   Thank you for allowing pharmacy to be a part of this patient's care.  Pricilla Larsson 09/25/2017 10:18 PM

## 2017-09-25 NOTE — ED Notes (Signed)
CRITICAL VALUE ALERT  Critical Value:  Troponin 0.04  Date & Time Notied:  09/25/2017 1634  Provider Notified: Alvino Chapel  Orders Received/Actions taken: No orders received at this time

## 2017-09-26 ENCOUNTER — Observation Stay (HOSPITAL_BASED_OUTPATIENT_CLINIC_OR_DEPARTMENT_OTHER): Payer: PRIVATE HEALTH INSURANCE

## 2017-09-26 ENCOUNTER — Observation Stay (HOSPITAL_COMMUNITY): Payer: PRIVATE HEALTH INSURANCE

## 2017-09-26 DIAGNOSIS — N186 End stage renal disease: Secondary | ICD-10-CM

## 2017-09-26 DIAGNOSIS — E876 Hypokalemia: Secondary | ICD-10-CM | POA: Diagnosis not present

## 2017-09-26 DIAGNOSIS — Z992 Dependence on renal dialysis: Secondary | ICD-10-CM

## 2017-09-26 DIAGNOSIS — I10 Essential (primary) hypertension: Secondary | ICD-10-CM | POA: Diagnosis not present

## 2017-09-26 DIAGNOSIS — K219 Gastro-esophageal reflux disease without esophagitis: Secondary | ICD-10-CM

## 2017-09-26 DIAGNOSIS — J9601 Acute respiratory failure with hypoxia: Secondary | ICD-10-CM | POA: Diagnosis not present

## 2017-09-26 DIAGNOSIS — J189 Pneumonia, unspecified organism: Principal | ICD-10-CM

## 2017-09-26 DIAGNOSIS — E8779 Other fluid overload: Secondary | ICD-10-CM | POA: Diagnosis not present

## 2017-09-26 DIAGNOSIS — E875 Hyperkalemia: Secondary | ICD-10-CM | POA: Diagnosis not present

## 2017-09-26 DIAGNOSIS — I361 Nonrheumatic tricuspid (valve) insufficiency: Secondary | ICD-10-CM

## 2017-09-26 LAB — RESPIRATORY PANEL BY PCR
Adenovirus: NOT DETECTED
BORDETELLA PERTUSSIS-RVPCR: NOT DETECTED
CORONAVIRUS 229E-RVPPCR: NOT DETECTED
CORONAVIRUS HKU1-RVPPCR: NOT DETECTED
Chlamydophila pneumoniae: NOT DETECTED
Coronavirus NL63: NOT DETECTED
Coronavirus OC43: NOT DETECTED
INFLUENZA B-RVPPCR: NOT DETECTED
Influenza A: NOT DETECTED
METAPNEUMOVIRUS-RVPPCR: NOT DETECTED
MYCOPLASMA PNEUMONIAE-RVPPCR: NOT DETECTED
PARAINFLUENZA VIRUS 2-RVPPCR: NOT DETECTED
Parainfluenza Virus 1: NOT DETECTED
Parainfluenza Virus 3: NOT DETECTED
Parainfluenza Virus 4: NOT DETECTED
RESPIRATORY SYNCYTIAL VIRUS-RVPPCR: NOT DETECTED
Rhinovirus / Enterovirus: NOT DETECTED

## 2017-09-26 LAB — HEPATIC FUNCTION PANEL
ALBUMIN: 3.7 g/dL (ref 3.5–5.0)
ALT: 8 U/L — AB (ref 14–54)
AST: 18 U/L (ref 15–41)
Alkaline Phosphatase: 79 U/L (ref 38–126)
TOTAL PROTEIN: 7.3 g/dL (ref 6.5–8.1)
Total Bilirubin: 0.4 mg/dL (ref 0.3–1.2)

## 2017-09-26 LAB — ECHOCARDIOGRAM COMPLETE
HEIGHTINCHES: 63 in
WEIGHTICAEL: 3961.23 [oz_av]

## 2017-09-26 LAB — BASIC METABOLIC PANEL
ANION GAP: 13 (ref 5–15)
BUN: 22 mg/dL — ABNORMAL HIGH (ref 6–20)
CALCIUM: 8.9 mg/dL (ref 8.9–10.3)
CO2: 25 mmol/L (ref 22–32)
CREATININE: 6.95 mg/dL — AB (ref 0.44–1.00)
Chloride: 100 mmol/L — ABNORMAL LOW (ref 101–111)
GFR calc Af Amer: 7 mL/min — ABNORMAL LOW (ref >60)
GFR, EST NON AFRICAN AMERICAN: 6 mL/min — AB (ref >60)
GLUCOSE: 82 mg/dL (ref 65–99)
Potassium: 4.5 mmol/L (ref 3.5–5.1)
Sodium: 138 mmol/L (ref 135–145)

## 2017-09-26 LAB — GLUCOSE, CAPILLARY
GLUCOSE-CAPILLARY: 73 mg/dL (ref 65–99)
GLUCOSE-CAPILLARY: 109 mg/dL — AB (ref 65–99)
Glucose-Capillary: 145 mg/dL — ABNORMAL HIGH (ref 65–99)
Glucose-Capillary: 156 mg/dL — ABNORMAL HIGH (ref 65–99)

## 2017-09-26 LAB — CBC WITH DIFFERENTIAL/PLATELET
BASOS PCT: 1 %
Basophils Absolute: 0.1 10*3/uL (ref 0.0–0.1)
EOS PCT: 7 %
Eosinophils Absolute: 0.7 10*3/uL (ref 0.0–0.7)
HCT: 27.8 % — ABNORMAL LOW (ref 36.0–46.0)
Hemoglobin: 8.6 g/dL — ABNORMAL LOW (ref 12.0–15.0)
Lymphocytes Relative: 21 %
Lymphs Abs: 2.2 10*3/uL (ref 0.7–4.0)
MCH: 29.1 pg (ref 26.0–34.0)
MCHC: 30.9 g/dL (ref 30.0–36.0)
MCV: 93.9 fL (ref 78.0–100.0)
MONO ABS: 0.6 10*3/uL (ref 0.1–1.0)
MONOS PCT: 5 %
NEUTROS ABS: 6.9 10*3/uL (ref 1.7–7.7)
Neutrophils Relative %: 66 %
Platelets: 285 10*3/uL (ref 150–400)
RBC: 2.96 MIL/uL — AB (ref 3.87–5.11)
RDW: 14.5 % (ref 11.5–15.5)
WBC: 10.4 10*3/uL (ref 4.0–10.5)

## 2017-09-26 LAB — LIPASE, BLOOD: Lipase: 41 U/L (ref 11–51)

## 2017-09-26 LAB — LACTIC ACID, PLASMA
LACTIC ACID, VENOUS: 1.3 mmol/L (ref 0.5–1.9)
LACTIC ACID, VENOUS: 2.1 mmol/L — AB (ref 0.5–1.9)

## 2017-09-26 LAB — HEMOGLOBIN A1C
HEMOGLOBIN A1C: 5.3 % (ref 4.8–5.6)
MEAN PLASMA GLUCOSE: 105.41 mg/dL

## 2017-09-26 LAB — INFLUENZA PANEL BY PCR (TYPE A & B)
INFLAPCR: NEGATIVE
INFLBPCR: NEGATIVE

## 2017-09-26 LAB — PROCALCITONIN: PROCALCITONIN: 1.96 ng/mL

## 2017-09-26 MED ORDER — INSULIN ASPART 100 UNIT/ML ~~LOC~~ SOLN
0.0000 [IU] | Freq: Three times a day (TID) | SUBCUTANEOUS | Status: DC
  Administered 2017-09-27: 1 [IU] via SUBCUTANEOUS
  Administered 2017-09-28 (×2): 2 [IU] via SUBCUTANEOUS
  Administered 2017-09-28 – 2017-09-29 (×2): 3 [IU] via SUBCUTANEOUS
  Administered 2017-09-29: 1 [IU] via SUBCUTANEOUS
  Administered 2017-09-29: 3 [IU] via SUBCUTANEOUS
  Administered 2017-09-30: 2 [IU] via SUBCUTANEOUS

## 2017-09-26 MED ORDER — ROPINIROLE HCL 1 MG PO TABS
2.0000 mg | ORAL_TABLET | Freq: Every day | ORAL | Status: DC
  Administered 2017-09-26 – 2017-09-29 (×4): 2 mg via ORAL
  Filled 2017-09-26 (×6): qty 2

## 2017-09-26 MED ORDER — EPOETIN ALFA 10000 UNIT/ML IJ SOLN
8000.0000 [IU] | INTRAMUSCULAR | Status: DC
Start: 2017-09-27 — End: 2017-09-30
  Administered 2017-09-27 – 2017-09-30 (×2): 8000 [IU] via SUBCUTANEOUS
  Filled 2017-09-26 (×4): qty 1

## 2017-09-26 MED ORDER — AZITHROMYCIN 250 MG PO TABS
500.0000 mg | ORAL_TABLET | ORAL | Status: DC
  Administered 2017-09-26 – 2017-09-29 (×4): 500 mg via ORAL
  Filled 2017-09-26 (×4): qty 2

## 2017-09-26 MED ORDER — PROMETHAZINE HCL 25 MG/ML IJ SOLN
12.5000 mg | INTRAMUSCULAR | Status: DC | PRN
  Administered 2017-09-26 – 2017-09-27 (×3): 12.5 mg via INTRAVENOUS
  Filled 2017-09-26 (×3): qty 1

## 2017-09-26 MED ORDER — PANTOPRAZOLE SODIUM 40 MG PO TBEC
40.0000 mg | DELAYED_RELEASE_TABLET | Freq: Every day | ORAL | Status: DC
  Administered 2017-09-26 – 2017-09-30 (×5): 40 mg via ORAL
  Filled 2017-09-26 (×5): qty 1

## 2017-09-26 MED ORDER — FERRIC CITRATE 1 GM 210 MG(FE) PO TABS
420.0000 mg | ORAL_TABLET | Freq: Two times a day (BID) | ORAL | Status: DC
  Administered 2017-09-26 – 2017-09-30 (×9): 420 mg via ORAL
  Filled 2017-09-26 (×13): qty 2

## 2017-09-26 MED ORDER — PIPERACILLIN-TAZOBACTAM IN DEX 2-0.25 GM/50ML IV SOLN
2.2500 g | Freq: Three times a day (TID) | INTRAVENOUS | Status: DC
  Filled 2017-09-26: qty 50

## 2017-09-26 MED ORDER — PIPERACILLIN SOD-TAZOBACTAM SO 2.25 (2-0.25) G IV SOLR
2.2500 g | Freq: Three times a day (TID) | INTRAVENOUS | Status: DC
  Administered 2017-09-26 – 2017-09-29 (×10): 2.25 g via INTRAVENOUS
  Filled 2017-09-26 (×13): qty 2.25

## 2017-09-26 NOTE — Progress Notes (Signed)
*  PRELIMINARY RESULTS* Echocardiogram 2D Echocardiogram has been performed.  Patricia [name] 09/26/2017, 9:51 AM

## 2017-09-26 NOTE — Consult Note (Signed)
Patricia [name] MRN: [id number] DOB/AGE: 06/20/1971 46 y.o. Primary Care Physician:Jackson, Hulen Shouts, PA-C Admit date: 09/25/2017 Chief Complaint:  No chief complaint on file.  HPI: Pt is 46 year old female with past medical hx of ESRD who came to ER with c/o shortness of breath.   HPI dates back to yesterday nightys when pt started feeling dyspnea, it was progressive the patient says they challenged me with the fluid and 3 liters were removed but I still was short of breath and   so she came to ER Pt alsio gave hx of feeling weak from past week , slow in onset. NO c/o fever but does c/ o cough No c/o abdominal pain NO c/o syncope NO c/o recent travel No c/o frequency/urgency/dysuria    Past Medical History:  Diagnosis Date  . Blood transfusion without reported diagnosis   . Cancer ([place])   . Diabetes mellitus without complication ([place])   . Dialysis patient ([place])    mon, wed, friday,   . Hypertension   . Renal disorder   . Renal insufficiency         Family History  Problem Relation Age of Onset  . Diabetes Mellitus II Mother   . Hypertension Mother   . Hypertension Sister   . Hypertension Sister   . Colon cancer Neg Hx     Social History:  reports that she has never smoked. She has never used smokeless tobacco. She reports that she does not drink alcohol or use drugs.   Allergies:  Allergies  Allergen Reactions  . Reglan [Metoclopramide] Other (See Comments)    hallucinations hallucinations  . Amlodipine Besylate Rash and Other (See Comments)    dizziness    Medications Prior to Admission  Medication Sig Dispense Refill  . acetaminophen (TYLENOL) 500 MG tablet Take 500 mg by mouth 3 (three) times daily as needed for moderate pain.    Marland Kitchen AMITIZA 24 MCG capsule TAKE 1 CAPSULE (24 MCG TOTAL) BY MOUTH 2 (TWO) TIMES DAILY WITH A MEAL. 60 capsule 5  . chlorhexidine (PERIDEX) 0.12 % solution Swish and spit 15 mLs 2 (two) times daily.    . clindamycin (CLEOCIN) 300  MG capsule Take 300 mg by mouth 3 (three) times daily. 10 day course starting on 09/14/2017    . Ferric Citrate (AURYXIA) 1 GM 210 MG(Fe) TABS Take 2 tablets by mouth 2 (two) times daily with a meal.    . fluticasone (FLONASE) 50 MCG/ACT nasal spray Place 2 sprays into both nostrils daily. LEFT NARE ONLY    . furosemide (LASIX) 40 MG tablet Take 40-80 mg by mouth 2 (two) times daily. 80mg  in the morning and 40mg  at bedtime    . glipiZIDE (GLUCOTROL) 5 MG tablet Take 5 mg by mouth 2 (two) times daily as needed. For blood sugar levels over 150    . labetalol (NORMODYNE) 200 MG tablet Take 200 mg by mouth 2 (two) times daily.    . multivitamin (RENA-VIT) TABS tablet Take 1 tablet by mouth daily.    Marland Kitchen NIFEdipine (PROCARDIA-XL/ADALAT CC) 60 MG 24 hr tablet Take 1 tablet (60 mg total) by mouth daily. 30 tablet 2  . ondansetron (ZOFRAN-ODT) 4 MG disintegrating tablet Take 4 mg by mouth every 8 (eight) hours as needed for nausea or vomiting.    . pantoprazole (PROTONIX) 20 MG tablet Take 20 mg by mouth daily.  1  . rOPINIRole (REQUIP XL) 2 MG 24 hr tablet Take 2 mg by mouth at bedtime.     Marland Kitchen  SENSIPAR 60 MG tablet Take 60 mg by mouth every evening.    . sevelamer carbonate (RENVELA) 800 MG tablet Take 1,600 mg by mouth 3 (three) times daily with meals.     Marland Kitchen oxyCODONE (OXY IR/ROXICODONE) 5 MG immediate release tablet Take 5 mg by mouth daily as needed for moderate pain (patient has on hand but does not take).          PPJ:KDTOI from the symptoms mentioned above,there are no other symptoms referable to all systems reviewed.    Physical Exam: Vital signs in last 24 hours: Temp:  [98.2 F (36.8 C)-98.8 F (37.1 C)] 98.2 F (36.8 C) (10/11 0800) Pulse Rate:  [71-90] 77 (10/11 0600) Resp:  [15-31] 26 (10/11 0600) BP: (146-177)/(67-107) 172/86 (10/11 0600) SpO2:  [88 %-99 %] 95 % (10/11 0600) Weight:  [245 lb (111.1 kg)-247 lb 9.2 oz (112.3 kg)] 247 lb 9.2 oz (112.3 kg) (10/11 0500) Weight change:   Last BM Date: 09/25/17  Intake/Output from previous day: 10/10 0701 - 10/11 0700 In: -  Out: 25 [Drains:25] No intake/output data recorded.   Physical Exam: General- pt is awake,alert, oriented to time place and person Resp- No acute REsp distress,decreased at bases. CVS- S1S2 irregular in rate and rhythm GIT- BS+, soft, NT, ND EXT- NO LE Edema, NO Cyanosis CNS- CN 2-12 grossly intact. Moving all 4 extremities Psych- normal mood and affect Access-AVF   Lab Results: CBC  Recent Labs  09/25/17 1510 09/26/17 0302  WBC 11.4* 10.4  HGB 8.7* 8.6*  HCT 26.9* 27.8*  PLT 300 285    BMET  Recent Labs  09/25/17 1510 09/26/17 0302  NA 136 138  K 3.4* 4.5  CL 97* 100*  CO2 26 25  GLUCOSE 206* 82  BUN 15 22*  CREATININE 5.25* 6.95*  CALCIUM 8.9 8.9    MICRO Recent Results (from the past 240 hour(s))  MRSA PCR Screening     Status: None   Collection Time: 09/25/17 10:10 PM  Result Value Ref Range Status   MRSA by PCR NEGATIVE NEGATIVE Final    Comment:        The GeneXpert MRSA Assay (FDA approved for NASAL specimens only), is one component of a comprehensive MRSA colonization surveillance program. It is not intended to diagnose MRSA infection nor to guide or monitor treatment for MRSA infections.       Lab Results  Component Value Date   CALCIUM 8.9 09/26/2017   CAION 0.97 (L) 09/27/2016   PHOS 4.9 (H) 07/24/2017      Impression: 1)Renal  ESRD on HD                Pt is on Mon/Wed/Fri schedule                Pt was dialyzed yesterday                Pt saturating well at only 2 liters   2)HTN   BP not at goal                Medication-  On Diuretics-Lasix On Calcium Channel Blockers On Alpha and beta Blockers    3)Anemia IN ESRD the goal for  HGb 9--11 HGb is not at goal Will keep on  epo   4)CKD Mineral-Bone Disorder  Phosphorus at goal. Calcium is  at goal.  5)Resp- admitted with shortness of breath Pt with ground glass  appearance on CT scan Pt had patchy diease in  Aug as well. On Iv antibiotics Clinically better Primary MD following  6)Electrolytes  Normokalemic Normonatremic   7)Acid base Co2 at goal     Plan:  Will continue current care Will d/w Hospitalist team  about possible need from pul for interstitial lung disease  NO need of HD today Will dialyze in am       BHUTANI,MANPREET S 09/26/2017, 10:18 AM

## 2017-09-26 NOTE — Care Management Obs Status (Signed)
[place] NOTIFICATION   Patient Details  Name: Patricia [name] MRN: [id number] Date of Birth: 1971/07/10   Medicare Observation Status Notification Given:  Yes    Kaidance Pantoja, Chauncey Reading, RN 09/26/2017, 11:23 AM

## 2017-09-26 NOTE — Progress Notes (Signed)
Respiratory Care Note: Attempted to instruct patient on the use an IS for her breathing exercise. The patient stated " I have one of those at home and already know how to do it." I asked if she want another to do here and her response was "no." RN aware.

## 2017-09-26 NOTE — Progress Notes (Signed)
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 The patient states that she began feeling weak and tired on 09/22/2017 with decreased oral intake and some mild dyspnea on exertion. The patient went to dialysis and stated for the whole duration on 09/23/2017. She felt somewhat better with improving shortness of breath. However on the evening of 09/24/2018, shortness of breath worsened to the point where she was not able to lie flat. She had a full dialysis session on 09/25/2018. She remained short of breath. As a result, the patient presented to the emergency department for further evaluation. The patient states that she had a fever of 101.8F on 09/20/2017, and has had low-grade temperatures since that period of time. She states that her dad has been sick with upper respiratory type symptoms. She has been complaining of continued coughing with gray sputum. She has been coughing to the point of emesis. The patient states that she has chronic nausea and vomiting, but this had worsened since she started on clindamycin with which she was discharged from [facility]. The patient was told she was given clindamycin for pneumonia. In emergency department, the patient was hypoxic with saturation of 80%. CT angiogram of the chest was negative for pulmonary embolus but showed bilateral bibasilar patchy airspace disease right greater than left with diffuse groundglass opacities.  Assessment/Plan: Acute respiratory failure with hypoxia -While the patient may have a degree of fluid overload, I suspect the patient also has HCAP vs atypical PNA -stable on 2L -d/c  levoflox -start zosyn and azithro -CTA chest neg for PE--as discussed above -personally reviewed EKG--sinus T wave inversion V4-6 -personally reviewed CXR--no edema or consolidation  HCAP -check procalcitonin -viral respiratory panel -influenza PCR -start zosyn--question aspiration with recurrent vomiting  Fluid overload/ESRD -HD per nephrology -pt compliant with HD -Continue Sensipar and Renvela -Echo  Diabetes mellitus type 2 -Holding glipizide - check hemoglobin A1c  -NovoLog sliding scale   essential hypertension  -Continue labetalol, nifedipine   Nausea and vomiting -Seems to be improving -May have been related to the patient's clindamycin -Check LFTs and lipase  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 She had 2-3 episodes of emesis last night. She denies any headache, fevers, chills, abdominal pain. Denies any rashes or synovitis.  Objective: Vitals:   09/26/17 0300 09/26/17 0400 09/26/17 0500 09/26/17 0600  BP: (!) 163/89 (!) 162/92 (!) 177/107 (!) 172/86  Pulse: 81 81 83 77  Resp: (!) 31 (!) 28 (!) 25 (!) 26  Temp:  98.6 F (37 C)    TempSrc:  Oral    SpO2: 96% 93% 98% 95%  Weight:   112.3 kg (247 lb 9.2 oz)   Height:        Intake/Output Summary (Last 24  hours) at 09/26/17 0820 Last data filed at 09/26/17 0500  Gross per 24 hour  Intake                0 ml  Output               25 ml  Net              -25 ml   Weight change:  Exam:   General:  Pt is alert, follows commands appropriately, not in acute distress  HEENT: No icterus,  No thrush, No neck mass, [place]/AT  Cardiovascular: RRR, S1/S2, no rubs, no gallops  Respiratory: Bibasilar rales without wheezes. Good air movement.   Abdomen: Soft/+BS, non tender, non distended, no guarding  Extremities: No edema, No lymphangitis, No petechiae, No rashes, no synovitis   Data Reviewed: I have personally reviewed following labs and imaging studies Basic Metabolic Panel:  Recent Labs Lab 09/25/17 1510 09/26/17 0302  NA 136 138  K 3.4* 4.5  CL 97* 100*  CO2 26 25  GLUCOSE 206* 82  BUN 15 22*  CREATININE 5.25* 6.95*  CALCIUM 8.9 8.9   Liver Function Tests: No results for input(s): AST, ALT, ALKPHOS, BILITOT, PROT, ALBUMIN in the last 168 hours. No results for input(s): LIPASE, AMYLASE in the last 168 hours. No results for input(s): AMMONIA in the last 168 hours. Coagulation Profile: No results for input(s): INR, PROTIME in the last 168 hours. CBC:  Recent Labs Lab 09/25/17 1510 09/26/17 0302  WBC 11.4* 10.4  NEUTROABS 8.8* 6.9  HGB 8.7* 8.6*  HCT 26.9* 27.8*  MCV 91.8 93.9  PLT 300 285   Cardiac Enzymes:  Recent Labs Lab 09/25/17 1514  TROPONINI 0.04*   BNP: Invalid input(s): POCBNP CBG:  Recent Labs Lab 09/25/17 2311 09/26/17 0804  GLUCAP 83 73   HbA1C: No results for input(s): HGBA1C in the last 72 hours. Urine analysis:    Component Value Date/Time   COLORURINE YELLOW 09/14/2016 1915   APPEARANCEUR CLEAR 09/14/2016 1915   LABSPEC 1.015 09/14/2016 1915   PHURINE 7.5 09/14/2016 1915   GLUCOSEU >1000 (A) 09/14/2016 1915   HGBUR TRACE (A) 09/14/2016 1915   BILIRUBINUR NEGATIVE 09/14/2016 [place] NEGATIVE 09/14/2016 1915   PROTEINUR >300 (A) 09/14/2016 1915   NITRITE NEGATIVE 09/14/2016 1915   LEUKOCYTESUR NEGATIVE 09/14/2016 1915   Sepsis Labs: @LABRCNTIP (procalcitonin:4,lacticidven:4) ) Recent Results (from the past 240 hour(s))  MRSA PCR Screening     Status: None   Collection Time: 09/25/17 10:10 PM  Result  Value Ref Range Status   MRSA by PCR NEGATIVE NEGATIVE Final    Comment:        The GeneXpert MRSA Assay (FDA approved for NASAL specimens only), is one component of a comprehensive MRSA colonization surveillance program. It is not intended to diagnose MRSA infection nor to guide or monitor treatment for MRSA infections.      Scheduled Meds: . chlorhexidine  15 mL Mouth/Throat BID  . cinacalcet  60 mg Oral QPM  . ferric citrate  420 mg Oral BID WC  . fluticasone  2 spray Each Nare Daily  . glipiZIDE  5 mg Oral BID AC  . labetalol  200 mg Oral BID  . lubiprostone  24 mcg Oral BID WC  . multivitamin  1 tablet Oral Daily  . NIFEdipine  60 mg Oral Daily  . pantoprazole  40 mg Oral Daily  . rOPINIRole  2 mg Oral QHS  . sevelamer carbonate  1,600 mg Oral TID WC   Continuous Infusions: . [START ON 09/27/2017] levofloxacin (LEVAQUIN) IV      Procedures/Studies: Dg Chest 2 View  Result Date: 09/25/2017 CLINICAL DATA:  Shortness of breath and cough for 2 days. EXAM: CHEST  2 VIEW COMPARISON:  PA and lateral chest and CT chest 07/21/2017. FINDINGS: The lungs are clear. Heart size is normal. No pneumothorax or pleural fluid. No acute bony abnormality. Surgical clips left breast are identified. IMPRESSION: No acute disease. Electronically Signed   By: Inge Rise M.D.   On: 09/25/2017 15:43   Ct Angio Chest Pe W And/or Wo Contrast  Result Date: 09/25/2017 CLINICAL DATA:  Cough and shortness of breath. Breast cancer with recent mastectomy. Recent pneumonia. PE suspected, high pretest probability. EXAM: CT ANGIOGRAPHY CHEST WITH CONTRAST TECHNIQUE: Multidetector CT imaging of the chest was performed using the standard protocol during bolus administration of intravenous contrast. Multiplanar CT image reconstructions and MIPs were obtained to evaluate the vascular anatomy. CONTRAST:  100 mL Isovue 370 COMPARISON:  CTA chest 07/21/2017 FINDINGS: Cardiovascular: The heart is enlarged.  Minimal pericardial fluid is likely physiologic. Minimal atherosclerotic changes are noted at the aortic arch without aneurysm or focal stenosis. Pulmonary artery opacification is excellent. No focal filling defects are present to suggest pulmonary embolus. Mediastinum/Nodes: Peritracheal and subcarinal adenopathy is stable. No significant hilar adenopathy is present. No significant axillary adenopathy is present. Lungs/Pleura: Patchy bilateral airspace disease is present bilaterally, right greater than left. There is diffuse ground-glass attenuation. No significant effusions are present. Upper Abdomen: The patient is status post cholecystectomy. Musculoskeletal: Vertebral body heights and alignment are normal. No focal lytic or blastic lesions are present. The ribs are unremarkable. Previous fluid collection in the left breast has been drained. Surgical drains remain in place. Review of the MIP images confirms the above findings. IMPRESSION: 1. No evidence pulmonary embolus. 2. Diffuse ground-glass attenuation likely reflecting edema and congestive heart failure. 3. Patchy bibasilar airspace disease likely reflects atelectasis. Infection is considered less likely. 4. Mediastinal adenopathy is stable, likely reactive. 5. Interval drainage of left breast fluid collection. Electronically Signed   By: [place] Morelle M.D.   On: 09/25/2017 18:31    Shaima Sardinas, DO  [facility] Pager [phone]  If 7PM-7AM, please contact night-coverage [email or web address] Password [email or web address] 09/26/2017, 8:20 AM   LOS: 0 days

## 2017-09-26 NOTE — Care Management Note (Signed)
Case Management Note  Patient Details  Name: Patricia [name] MRN: [id number] Date of Birth: [birth year]  Subjective/Objective: Adm with volume overload/PNA. Left sided DCIS of breast, s/p mastectomy 09/12/17.  Pt from home, lives with son and is ind with ADL's. She is employed as Curator, currently out on [employer], has insurance and PCP. She drives. She is ESRD on HD MWF at [facility] [place].           Action/Plan: Anticipate DC home with self care. Patient [place] home with oxygen last admission, but returned it. CM will follow for needs.  Expected Discharge Date:      09/29/2017            Expected Discharge Plan:  Home/Self Care  In-House Referral:     Discharge planning Services  CM Consult  Post Acute Care Choice:    Choice offered to:     DME Arranged:    DME Agency:     HH Arranged:    HH Agency:     Status of Service:  In process, will continue to follow  If discussed at [place] Length of Stay Meetings, dates discussed:    Additional Comments:  Althia Egolf, Chauncey Reading, RN 09/26/2017, 2:48 PM

## 2017-09-27 DIAGNOSIS — N186 End stage renal disease: Secondary | ICD-10-CM | POA: Diagnosis not present

## 2017-09-27 DIAGNOSIS — E876 Hypokalemia: Secondary | ICD-10-CM

## 2017-09-27 DIAGNOSIS — Z992 Dependence on renal dialysis: Secondary | ICD-10-CM | POA: Diagnosis not present

## 2017-09-27 DIAGNOSIS — I10 Essential (primary) hypertension: Secondary | ICD-10-CM | POA: Diagnosis not present

## 2017-09-27 DIAGNOSIS — J189 Pneumonia, unspecified organism: Secondary | ICD-10-CM | POA: Diagnosis not present

## 2017-09-27 DIAGNOSIS — J9601 Acute respiratory failure with hypoxia: Secondary | ICD-10-CM | POA: Diagnosis not present

## 2017-09-27 LAB — GLUCOSE, CAPILLARY
GLUCOSE-CAPILLARY: 103 mg/dL — AB (ref 65–99)
GLUCOSE-CAPILLARY: 190 mg/dL — AB (ref 65–99)
Glucose-Capillary: 108 mg/dL — ABNORMAL HIGH (ref 65–99)
Glucose-Capillary: 150 mg/dL — ABNORMAL HIGH (ref 65–99)

## 2017-09-27 LAB — CBC
HCT: 27.3 % — ABNORMAL LOW (ref 36.0–46.0)
Hemoglobin: 8.3 g/dL — ABNORMAL LOW (ref 12.0–15.0)
MCH: 29 pg (ref 26.0–34.0)
MCHC: 30.4 g/dL (ref 30.0–36.0)
MCV: 95.5 fL (ref 78.0–100.0)
PLATELETS: 305 10*3/uL (ref 150–400)
RBC: 2.86 MIL/uL — ABNORMAL LOW (ref 3.87–5.11)
RDW: 14.5 % (ref 11.5–15.5)
WBC: 9.4 10*3/uL (ref 4.0–10.5)

## 2017-09-27 LAB — BASIC METABOLIC PANEL
ANION GAP: 13 (ref 5–15)
BUN: 34 mg/dL — ABNORMAL HIGH (ref 6–20)
CALCIUM: 8.1 mg/dL — AB (ref 8.9–10.3)
CHLORIDE: 99 mmol/L — AB (ref 101–111)
CO2: 25 mmol/L (ref 22–32)
CREATININE: 9.54 mg/dL — AB (ref 0.44–1.00)
GFR, EST AFRICAN AMERICAN: 5 mL/min — AB (ref >60)
GFR, EST NON AFRICAN AMERICAN: 4 mL/min — AB (ref >60)
Glucose, Bld: 115 mg/dL — ABNORMAL HIGH (ref 65–99)
POTASSIUM: 4.9 mmol/L (ref 3.5–5.1)
SODIUM: 137 mmol/L (ref 135–145)

## 2017-09-27 LAB — HIV ANTIBODY (ROUTINE TESTING W REFLEX): HIV Screen 4th Generation wRfx: NONREACTIVE

## 2017-09-27 LAB — PROCALCITONIN: PROCALCITONIN: 1.46 ng/mL

## 2017-09-27 MED ORDER — EPOETIN ALFA 10000 UNIT/ML IJ SOLN
INTRAMUSCULAR | Status: AC
  Administered 2017-09-27: 8000 [IU] via SUBCUTANEOUS
  Filled 2017-09-27: qty 1

## 2017-09-27 MED ORDER — LIDOCAINE-PRILOCAINE 2.5-2.5 % EX CREA
1.0000 "application " | TOPICAL_CREAM | CUTANEOUS | Status: DC | PRN
  Filled 2017-09-27: qty 5

## 2017-09-27 MED ORDER — HEPARIN SODIUM (PORCINE) 1000 UNIT/ML DIALYSIS
20.0000 [IU]/kg | INTRAMUSCULAR | Status: DC | PRN
  Administered 2017-09-27: 2200 [IU] via INTRAVENOUS_CENTRAL
  Filled 2017-09-27 (×2): qty 3

## 2017-09-27 MED ORDER — HEPARIN SODIUM (PORCINE) 1000 UNIT/ML IJ SOLN
INTRAMUSCULAR | Status: AC
  Administered 2017-09-27: 2200 [IU] via INTRAVENOUS_CENTRAL
  Filled 2017-09-27: qty 3

## 2017-09-27 MED ORDER — SODIUM CHLORIDE 0.9 % IV SOLN: 100.0000 mL | INTRAVENOUS | Status: DC | PRN

## 2017-09-27 MED ORDER — PENTAFLUOROPROP-TETRAFLUOROETH EX AERO
1.0000 "application " | INHALATION_SPRAY | CUTANEOUS | Status: DC | PRN
  Filled 2017-09-27: qty 30

## 2017-09-27 MED ORDER — LIDOCAINE HCL (PF) 1 % IJ SOLN: 5.0000 mL | INTRAMUSCULAR | Status: DC | PRN

## 2017-09-27 NOTE — Progress Notes (Signed)
SATURATION QUALIFICATIONS: (This note is used to comply with regulatory documentation for home oxygen)  Patient Saturations on Room Air at Rest = 94%  Patient Saturations on Room Air while Ambulating = 80%  Patient Saturations on 2 Liters of oxygen while Ambulating = 88%  Please briefly explain why patient needs home oxygen:  Patient desats upon exertion

## 2017-09-27 NOTE — Procedures (Signed)
    HEMODIALYSIS TREATMENT NOTE:  4 hour low-heparin dialysis completed via left upper arm AVF (15g/antegrade). Goal met: 3 liters removed without interruption in ultrafiltration.  All blood was returned and hemostasis was achieved within 15 minutes.  spO2 89-90 on room air while awake; dropped to 86-89% when asleep.  PLAN: Ambulate on room air after HD.  Report given to Aldona Lento, RN.  Rockwell Alexandria, RN, CDN

## 2017-09-27 NOTE — Progress Notes (Signed)
Patricia [name]  MRN: [id number]  DOB/AGE: 1971-06-07 46 y.o.  Primary Care Physician:Jackson, Hulen Shouts, PA-C  Admit date: 09/25/2017  Chief Complaint:  No chief complaint on file.   S-Pt presented on  09/25/2017 with  No chief complaint on file. .    Pt today feels better.Pt seen on HD. Pt tolerating tx well   Meds . azithromycin  500 mg Oral Q24H  . chlorhexidine  15 mL Mouth/Throat BID  . cinacalcet  60 mg Oral QPM  . epoetin (EPOGEN/PROCRIT) injection  8,000 Units Subcutaneous Q M,W,F-HD  . ferric citrate  420 mg Oral BID WC  . fluticasone  2 spray Each Nare Daily  . insulin aspart  0-9 Units Subcutaneous TID WC  . labetalol  200 mg Oral BID  . lubiprostone  24 mcg Oral BID WC  . multivitamin  1 tablet Oral Daily  . NIFEdipine  60 mg Oral Daily  . pantoprazole  40 mg Oral Daily  . rOPINIRole  2 mg Oral QHS  . sevelamer carbonate  1,600 mg Oral TID WC     Physical Exam: Vital signs in last 24 hours: Temp:  [98.1 F (36.7 C)-98.6 F (37 C)] 98.1 F (36.7 C) (10/12 1120) Pulse Rate:  [73-90] 77 (10/12 1415) Resp:  [12-27] 22 (10/12 1120) BP: (135-164)/(70-91) 150/75 (10/12 1415) SpO2:  [89 %-99 %] 89 % (10/12 1415) Weight:  [252 lb 3.3 oz (114.4 kg)] 252 lb 3.3 oz (114.4 kg) (10/12 1120) Weight change: 7 lb 3.3 oz (3.269 kg) Last BM Date: 09/25/17  Intake/Output from previous day: 10/11 0701 - 10/12 0700 In: 400 [P.O.:250; IV Piggyback:150] Out: 35 [Drains:35] Total I/O In: 500 [P.O.:500] Out: -    Physical Exam: General- pt is awake,alert, oriented to time place and person Resp- No acute REsp distress, CTA B/L NO Rhonchi CVS- S1S2 regular in rate and rhythm GIT- BS+, soft, NT, ND EXT- NO LE Edema, Cyanosis Access- Left AVF  Lab Results: CBC  Recent Labs  09/26/17 0302 09/27/17 0412  WBC 10.4 9.4  HGB 8.6* 8.3*  HCT 27.8* 27.3*  PLT 285 305    BMET  Recent Labs  09/26/17 0302 09/27/17 0412  NA 138 137  K 4.5 4.9  CL 100* 99*  CO2  25 25  GLUCOSE 82 115*  BUN 22* 34*  CREATININE 6.95* 9.54*  CALCIUM 8.9 8.1*    MICRO Recent Results (from the past 240 hour(s))  MRSA PCR Screening     Status: None   Collection Time: 09/25/17 10:10 PM  Result Value Ref Range Status   MRSA by PCR NEGATIVE NEGATIVE Final    Comment:        The GeneXpert MRSA Assay (FDA approved for NASAL specimens only), is one component of a comprehensive MRSA colonization surveillance program. It is not intended to diagnose MRSA infection nor to guide or monitor treatment for MRSA infections.   Respiratory Panel by PCR     Status: None   Collection Time: 09/26/17  8:37 AM  Result Value Ref Range Status   Adenovirus NOT DETECTED NOT DETECTED Final   Coronavirus 229E NOT DETECTED NOT DETECTED Final   Coronavirus HKU1 NOT DETECTED NOT DETECTED Final   Coronavirus NL63 NOT DETECTED NOT DETECTED Final   Coronavirus OC43 NOT DETECTED NOT DETECTED Final   Metapneumovirus NOT DETECTED NOT DETECTED Final   Rhinovirus / Enterovirus NOT DETECTED NOT DETECTED Final   Influenza A NOT DETECTED NOT DETECTED Final   Influenza B NOT  DETECTED NOT DETECTED Final   Parainfluenza Virus 1 NOT DETECTED NOT DETECTED Final   Parainfluenza Virus 2 NOT DETECTED NOT DETECTED Final   Parainfluenza Virus 3 NOT DETECTED NOT DETECTED Final   Parainfluenza Virus 4 NOT DETECTED NOT DETECTED Final   Respiratory Syncytial Virus NOT DETECTED NOT DETECTED Final   Bordetella pertussis NOT DETECTED NOT DETECTED Final   Chlamydophila pneumoniae NOT DETECTED NOT DETECTED Final   Mycoplasma pneumoniae NOT DETECTED NOT DETECTED Final    Comment: Performed at [facility], [place] [address], [place], [place] [address]      Lab Results  Component Value Date   CALCIUM 8.1 (L) 09/27/2017   CAION 0.97 (L) 09/27/2016   PHOS 4.9 (H) 07/24/2017          Impression: 1)Renal  ESRD on HD                Pt is on Mon/Wed/Fri schedule                Pt will be dialyzed  today  2)HTN   BP  at goal          Medication-  On Diuretics-Lasix On Calcium Channel Blockers On Alpha and beta Blockers    3)Anemia IN ESRD the goal for  HGb 9--11 HGb is not at goal    4)CKD Mineral-Bone Disorder  Phosphorus at goal. Calcium is  at goal.  5)REsp - admitted with Dyspnea Pneumonia and Fluid overload  clinically better after IV Abx  6)Electrolytes  normokalemic  normonatremic   7)Acid base Co2 at goal      Plan:  Will dialyze today Will use 3 k bath Will keep on epo  Addendum Pt seen on HD   Torri Langston S 09/27/2017, 2:25 PM

## 2017-09-27 NOTE — Progress Notes (Signed)
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 The patient states that she began feeling weak and tired on 09/22/2017 with decreased oral intake and some mild dyspnea on exertion. The patient went to dialysis and stated for the whole duration on 09/23/2017. She felt somewhat better with improving shortness of breath. However on the evening of 09/24/2018, shortness of breath worsened to the point where she was not able to lie flat. She had a full dialysis session on 09/25/2018. She remained short of breath. As a result, the patient presented to the emergency department for further evaluation. The patient states that she had a fever of 101.39F on 09/20/2017, and has had low-grade temperatures since that period of time. She states that her dad has been sick with upper respiratory type symptoms. She has been complaining of continued coughing with gray sputum. She has been coughing to the point of emesis. The patient states that she has chronic nausea and vomiting, but this had worsened since she started on clindamycin with which she was discharged from [facility]. The patient was told she was given clindamycin for pneumonia. In emergency department, the patient was hypoxic with saturation of 80%. CT angiogram of the chest was negative for pulmonary embolus but showed bilateral bibasilar patchy airspace disease right greater than left with diffuse groundglass opacities.  Assessment/Plan: Acute respiratory failure with hypoxia -While the patient may have a mild degree of fluid overload, I suspect the main issue is HCAP vs atypical PNA vs underlying interstitial lung  disease/BOOP? -stable on 2L>>>1L -d/c levoflox -continue zosyn and azithro -CTA chest neg for PE--as discussed above -personally reviewed EKG--sinus T wave inversion V4-6 -personally reviewed CXR--no edema or consolidation -consult pulmonary medicine--similar presentation in Aug 2018 with similar CT chest findings  HCAP -check procalcitonin--1.96>>>1.46 -viral respiratory panel--neg -influenza PCR--neg -start zosyn--question aspiration with recurrent vomiting  Fluid overload/ESRD -HD per nephrology -pt compliant with HD -Continue Sensipar and Renvela -Echo EF 60-65%, no WMA, normal RV  Diabetes mellitus type 2, controlled -Holding glipizide -check hemoglobin A1c --5.3 -NovoLog sliding scale   essential hypertension  -Continue labetalol, nifedipine   Nausea and vomiting -improving -May have been related to the patient's clindamycin -now tolerating diet  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 Denies any fevers, chills, chest pain, nausea, vomiting, diarrhea, abdominal pain.  Objective: Vitals:   09/27/17 1500 09/27/17 1515 09/27/17 1530 09/27/17 1545  BP: 133/66 (!) 151/78 (!) 167/74 (!) 154/81  Pulse: 78 78 77 78  Resp:    (!) 22  Temp:      TempSrc:      SpO2: 90% 90% (!) 89% 95%  Weight:    111.3 kg (245 lb 6 oz)  Height:        Intake/Output Summary (Last 24 hours) at 09/27/17 1659 Last data filed at 09/27/17 1530  Gross per 24 hour  Intake              900 ml   Output             3035 ml  Net            -2135 ml   Weight change: 3.269 kg (7 lb 3.3 oz) Exam:   General:  Pt is alert, follows commands appropriately, not in acute distress  HEENT: No icterus, No thrush, No neck mass, [place]/AT  Cardiovascular: RRR, S1/S2, no rubs, no gallops  Respiratory: Bibasilar crackles. No wheezing. Good air movement.  Abdomen: Soft/+BS, non tender, non distended, no guarding  Extremities: No edema, No lymphangitis, No petechiae, No rashes, no synovitis   Data Reviewed: I have personally reviewed following labs and imaging studies Basic Metabolic Panel:  Recent Labs Lab 09/25/17 1510 09/26/17 0302 09/27/17 0412  NA 136 138 137  K 3.4* 4.5 4.9  CL 97* 100* 99*  CO2 26 25 25   GLUCOSE 206* 82 115*  BUN 15 22* 34*  CREATININE 5.25* 6.95* 9.54*  CALCIUM 8.9 8.9 8.1*   Liver Function Tests:  Recent Labs Lab 09/26/17 0302  AST 18  ALT 8*  ALKPHOS 79  BILITOT 0.4  PROT 7.3  ALBUMIN 3.7    Recent Labs Lab 09/26/17 0302  LIPASE 41   No results for input(s): AMMONIA in the last 168 hours. Coagulation Profile: No results for input(s): INR, PROTIME in the last 168 hours. CBC:  Recent Labs Lab 09/25/17 1510 09/26/17 0302 09/27/17 0412  WBC 11.4* 10.4 9.4  NEUTROABS 8.8* 6.9  --   HGB 8.7* 8.6* 8.3*  HCT 26.9* 27.8* 27.3*  MCV 91.8 93.9 95.5  PLT 300 285 305   Cardiac Enzymes:  Recent Labs Lab 09/25/17 1514  TROPONINI 0.04*   BNP: Invalid input(s): POCBNP CBG:  Recent Labs Lab 09/26/17 1150 09/26/17 1757 09/26/17 2056 09/27/17 0734 09/27/17 1216  GLUCAP 109* 145* 156* 103* 108*   HbA1C:  Recent Labs  09/26/17 0302  HGBA1C 5.3   Urine analysis:    Component Value Date/Time   COLORURINE YELLOW 09/14/2016 1915   APPEARANCEUR CLEAR 09/14/2016 1915   LABSPEC 1.015 09/14/2016 1915   PHURINE 7.5 09/14/2016 1915   GLUCOSEU >1000 (A) 09/14/2016 1915   HGBUR TRACE (A) 09/14/2016 1915   BILIRUBINUR NEGATIVE  09/14/2016 [place] NEGATIVE 09/14/2016 1915   PROTEINUR >300 (A) 09/14/2016 1915   NITRITE NEGATIVE 09/14/2016 1915   LEUKOCYTESUR NEGATIVE 09/14/2016 1915   Sepsis Labs: @LABRCNTIP (procalcitonin:4,lacticidven:4) ) Recent Results (from the past 240 hour(s))  MRSA PCR Screening     Status: None   Collection Time: 09/25/17 10:10 PM  Result Value Ref Range Status   MRSA by PCR NEGATIVE NEGATIVE Final    Comment:        The GeneXpert MRSA Assay (FDA approved for NASAL specimens only), is one component of a comprehensive MRSA colonization surveillance program. It is not intended to diagnose MRSA infection nor to guide or monitor treatment for MRSA infections.   Respiratory Panel by PCR     Status: None   Collection Time: 09/26/17  8:37 AM  Result Value Ref Range Status   Adenovirus NOT DETECTED NOT DETECTED Final   Coronavirus 229E NOT DETECTED NOT DETECTED Final   Coronavirus HKU1 NOT DETECTED NOT DETECTED Final  Coronavirus NL63 NOT DETECTED NOT DETECTED Final   Coronavirus OC43 NOT DETECTED NOT DETECTED Final   Metapneumovirus NOT DETECTED NOT DETECTED Final   Rhinovirus / Enterovirus NOT DETECTED NOT DETECTED Final   Influenza A NOT DETECTED NOT DETECTED Final   Influenza B NOT DETECTED NOT DETECTED Final   Parainfluenza Virus 1 NOT DETECTED NOT DETECTED Final   Parainfluenza Virus 2 NOT DETECTED NOT DETECTED Final   Parainfluenza Virus 3 NOT DETECTED NOT DETECTED Final   Parainfluenza Virus 4 NOT DETECTED NOT DETECTED Final   Respiratory Syncytial Virus NOT DETECTED NOT DETECTED Final   Bordetella pertussis NOT DETECTED NOT DETECTED Final   Chlamydophila pneumoniae NOT DETECTED NOT DETECTED Final   Mycoplasma pneumoniae NOT DETECTED NOT DETECTED Final    Comment: Performed at [facility], [place] [address] [place], [place] [address]     Scheduled Meds: . azithromycin  500 mg Oral Q24H  . chlorhexidine  15 mL Mouth/Throat BID  . cinacalcet  60 mg Oral  QPM  . epoetin (EPOGEN/PROCRIT) injection  8,000 Units Subcutaneous Q M,W,F-HD  . ferric citrate  420 mg Oral BID WC  . fluticasone  2 spray Each Nare Daily  . insulin aspart  0-9 Units Subcutaneous TID WC  . labetalol  200 mg Oral BID  . lubiprostone  24 mcg Oral BID WC  . multivitamin  1 tablet Oral Daily  . NIFEdipine  60 mg Oral Daily  . pantoprazole  40 mg Oral Daily  . rOPINIRole  2 mg Oral QHS  . sevelamer carbonate  1,600 mg Oral TID WC   Continuous Infusions: . sodium chloride    . sodium chloride    . piperacillin-tazobactam (ZOSYN)  IV Stopped (09/27/17 1400)    Procedures/Studies: Dg Chest 2 View  Result Date: 09/25/2017 CLINICAL DATA:  Shortness of breath and cough for 2 days. EXAM: CHEST  2 VIEW COMPARISON:  PA and lateral chest and CT chest 07/21/2017. FINDINGS: The lungs are clear. Heart size is normal. No pneumothorax or pleural fluid. No acute bony abnormality. Surgical clips left breast are identified. IMPRESSION: No acute disease. Electronically Signed   By: Inge Rise M.D.   On: 09/25/2017 15:43   Ct Angio Chest Pe W And/or Wo Contrast  Result Date: 09/25/2017 CLINICAL DATA:  Cough and shortness of breath. Breast cancer with recent mastectomy. Recent pneumonia. PE suspected, high pretest probability. EXAM: CT ANGIOGRAPHY CHEST WITH CONTRAST TECHNIQUE: Multidetector CT imaging of the chest was performed using the standard protocol during bolus administration of intravenous contrast. Multiplanar CT image reconstructions and MIPs were obtained to evaluate the vascular anatomy. CONTRAST:  100 mL Isovue 370 COMPARISON:  CTA chest 07/21/2017 FINDINGS: Cardiovascular: The heart is enlarged. Minimal pericardial fluid is likely physiologic. Minimal atherosclerotic changes are noted at the aortic arch without aneurysm or focal stenosis. Pulmonary artery opacification is excellent. No focal filling defects are present to suggest pulmonary embolus. Mediastinum/Nodes:  Peritracheal and subcarinal adenopathy is stable. No significant hilar adenopathy is present. No significant axillary adenopathy is present. Lungs/Pleura: Patchy bilateral airspace disease is present bilaterally, right greater than left. There is diffuse ground-glass attenuation. No significant effusions are present. Upper Abdomen: The patient is status post cholecystectomy. Musculoskeletal: Vertebral body heights and alignment are normal. No focal lytic or blastic lesions are present. The ribs are unremarkable. Previous fluid collection in the left breast has been drained. Surgical drains remain in place. Review of the MIP images confirms the above findings. IMPRESSION: 1. No  evidence pulmonary embolus. 2. Diffuse ground-glass attenuation likely reflecting edema and congestive heart failure. 3. Patchy bibasilar airspace disease likely reflects atelectasis. Infection is considered less likely. 4. Mediastinal adenopathy is stable, likely reactive. 5. Interval drainage of left breast fluid collection. Electronically Signed   By: San Morelle M.D.   On: 09/25/2017 18:31    Wali Reinheimer, DO  [facility] Pager [phone]  If 7PM-7AM, please contact night-coverage [email or web address] Password [email or web address] 09/27/2017, 4:59 PM   LOS: 0 days

## 2017-09-28 DIAGNOSIS — Z7951 Long term (current) use of inhaled steroids: Secondary | ICD-10-CM | POA: Diagnosis not present

## 2017-09-28 DIAGNOSIS — Z888 Allergy status to other drugs, medicaments and biological substances status: Secondary | ICD-10-CM | POA: Diagnosis not present

## 2017-09-28 DIAGNOSIS — J9600 Acute respiratory failure, unspecified whether with hypoxia or hypercapnia: Secondary | ICD-10-CM | POA: Diagnosis present

## 2017-09-28 DIAGNOSIS — E1122 Type 2 diabetes mellitus with diabetic chronic kidney disease: Secondary | ICD-10-CM | POA: Diagnosis present

## 2017-09-28 DIAGNOSIS — Z90710 Acquired absence of both cervix and uterus: Secondary | ICD-10-CM | POA: Diagnosis not present

## 2017-09-28 DIAGNOSIS — E872 Acidosis: Secondary | ICD-10-CM | POA: Diagnosis present

## 2017-09-28 DIAGNOSIS — M898X9 Other specified disorders of bone, unspecified site: Secondary | ICD-10-CM | POA: Diagnosis present

## 2017-09-28 DIAGNOSIS — T368X5A Adverse effect of other systemic antibiotics, initial encounter: Secondary | ICD-10-CM | POA: Diagnosis present

## 2017-09-28 DIAGNOSIS — J9621 Acute and chronic respiratory failure with hypoxia: Secondary | ICD-10-CM | POA: Diagnosis present

## 2017-09-28 DIAGNOSIS — K219 Gastro-esophageal reflux disease without esophagitis: Secondary | ICD-10-CM | POA: Diagnosis present

## 2017-09-28 DIAGNOSIS — I10 Essential (primary) hypertension: Secondary | ICD-10-CM | POA: Diagnosis not present

## 2017-09-28 DIAGNOSIS — Y95 Nosocomial condition: Secondary | ICD-10-CM | POA: Diagnosis present

## 2017-09-28 DIAGNOSIS — Z9049 Acquired absence of other specified parts of digestive tract: Secondary | ICD-10-CM | POA: Diagnosis not present

## 2017-09-28 DIAGNOSIS — R110 Nausea: Secondary | ICD-10-CM | POA: Diagnosis present

## 2017-09-28 DIAGNOSIS — Z9010 Acquired absence of unspecified breast and nipple: Secondary | ICD-10-CM | POA: Diagnosis not present

## 2017-09-28 DIAGNOSIS — I5033 Acute on chronic diastolic (congestive) heart failure: Secondary | ICD-10-CM | POA: Diagnosis present

## 2017-09-28 DIAGNOSIS — Z853 Personal history of malignant neoplasm of breast: Secondary | ICD-10-CM | POA: Diagnosis not present

## 2017-09-28 DIAGNOSIS — E875 Hyperkalemia: Secondary | ICD-10-CM | POA: Diagnosis not present

## 2017-09-28 DIAGNOSIS — Z833 Family history of diabetes mellitus: Secondary | ICD-10-CM | POA: Diagnosis not present

## 2017-09-28 DIAGNOSIS — Z992 Dependence on renal dialysis: Secondary | ICD-10-CM | POA: Diagnosis not present

## 2017-09-28 DIAGNOSIS — D631 Anemia in chronic kidney disease: Secondary | ICD-10-CM | POA: Diagnosis present

## 2017-09-28 DIAGNOSIS — Z7984 Long term (current) use of oral hypoglycemic drugs: Secondary | ICD-10-CM | POA: Diagnosis not present

## 2017-09-28 DIAGNOSIS — N186 End stage renal disease: Secondary | ICD-10-CM | POA: Diagnosis not present

## 2017-09-28 DIAGNOSIS — Z8249 Family history of ischemic heart disease and other diseases of the circulatory system: Secondary | ICD-10-CM | POA: Diagnosis not present

## 2017-09-28 DIAGNOSIS — J189 Pneumonia, unspecified organism: Secondary | ICD-10-CM | POA: Diagnosis not present

## 2017-09-28 DIAGNOSIS — J9601 Acute respiratory failure with hypoxia: Secondary | ICD-10-CM | POA: Diagnosis not present

## 2017-09-28 DIAGNOSIS — R918 Other nonspecific abnormal finding of lung field: Secondary | ICD-10-CM | POA: Diagnosis not present

## 2017-09-28 DIAGNOSIS — R0902 Hypoxemia: Secondary | ICD-10-CM | POA: Diagnosis not present

## 2017-09-28 DIAGNOSIS — I132 Hypertensive heart and chronic kidney disease with heart failure and with stage 5 chronic kidney disease, or end stage renal disease: Secondary | ICD-10-CM | POA: Diagnosis present

## 2017-09-28 DIAGNOSIS — E876 Hypokalemia: Secondary | ICD-10-CM | POA: Diagnosis not present

## 2017-09-28 LAB — CBC WITH DIFFERENTIAL/PLATELET
BASOS PCT: 1 %
Basophils Absolute: 0.1 10*3/uL (ref 0.0–0.1)
EOS ABS: 0.6 10*3/uL (ref 0.0–0.7)
EOS PCT: 5 %
HCT: 27.8 % — ABNORMAL LOW (ref 36.0–46.0)
Hemoglobin: 8.7 g/dL — ABNORMAL LOW (ref 12.0–15.0)
LYMPHS ABS: 1.7 10*3/uL (ref 0.7–4.0)
Lymphocytes Relative: 16 %
MCH: 29.1 pg (ref 26.0–34.0)
MCHC: 31.3 g/dL (ref 30.0–36.0)
MCV: 93 fL (ref 78.0–100.0)
Monocytes Absolute: 0.3 10*3/uL (ref 0.1–1.0)
Monocytes Relative: 3 %
Neutro Abs: 7.8 10*3/uL — ABNORMAL HIGH (ref 1.7–7.7)
Neutrophils Relative %: 75 %
PLATELETS: 308 10*3/uL (ref 150–400)
RBC: 2.99 MIL/uL — AB (ref 3.87–5.11)
RDW: 14.4 % (ref 11.5–15.5)
WBC: 10.3 10*3/uL (ref 4.0–10.5)

## 2017-09-28 LAB — BASIC METABOLIC PANEL
ANION GAP: 14 (ref 5–15)
BUN: 24 mg/dL — ABNORMAL HIGH (ref 6–20)
CALCIUM: 8.2 mg/dL — AB (ref 8.9–10.3)
CO2: 27 mmol/L (ref 22–32)
Chloride: 95 mmol/L — ABNORMAL LOW (ref 101–111)
Creatinine, Ser: 7.46 mg/dL — ABNORMAL HIGH (ref 0.44–1.00)
GFR, EST AFRICAN AMERICAN: 7 mL/min — AB (ref >60)
GFR, EST NON AFRICAN AMERICAN: 6 mL/min — AB (ref >60)
GLUCOSE: 203 mg/dL — AB (ref 65–99)
Potassium: 4 mmol/L (ref 3.5–5.1)
SODIUM: 136 mmol/L (ref 135–145)

## 2017-09-28 LAB — GLUCOSE, CAPILLARY
GLUCOSE-CAPILLARY: 178 mg/dL — AB (ref 65–99)
GLUCOSE-CAPILLARY: 168 mg/dL — AB (ref 65–99)
Glucose-Capillary: 241 mg/dL — ABNORMAL HIGH (ref 65–99)
Glucose-Capillary: 254 mg/dL — ABNORMAL HIGH (ref 65–99)

## 2017-09-28 LAB — SEDIMENTATION RATE: SED RATE: 55 mm/h — AB (ref 0–22)

## 2017-09-28 LAB — PROCALCITONIN: Procalcitonin: 4.11 ng/mL

## 2017-09-28 LAB — HEPATITIS B SURFACE ANTIGEN: Hepatitis B Surface Ag: NEGATIVE

## 2017-09-28 NOTE — Progress Notes (Signed)
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, ESRD, diabetes mellitus, essential hypertension, GERD presented with 3-4 day history of fatigue and shortness of breath that significantly worsened on 08/25/2017 evening. The patient states that she began feeling weak and tired on 10/07/2018with decreased oral intake and some mild dyspnea on exertion. The patient went to dialysis and stated for the whole duration on 09/23/2017. She felt somewhat better with improving shortness of breath. However on the evening of 09/24/2018, shortness of breath worsened to the point where she was not able to lie flat. She had a full dialysis session on 09/25/2018. She remained short of breath. As a result, the patient presented to the emergency department for further evaluation. The patient states that she had a fever of 101.63F on 09/20/2017, and has had low-grade temperatures since that period of time. She states that her dad has been sick with upper respiratory type symptoms. She has been complaining of continued coughing with gray sputum. She has been coughing to the point of emesis. The patient states that she has chronic nausea and vomiting, but this had worsened since she started on clindamycin with which she was discharged from [facility]. The patient was told she was given clindamycin for pneumonia. In emergency department, the patient was hypoxic with saturation of 80%. CT angiogram of the chest was negative for pulmonary embolus but showed bilateral bibasilar patchy airspace disease right greater than left with diffuse groundglass opacities.  Assessment/Plan: Acute respiratory failure with hypoxia -While the patient may have a mild degree of fluid overload, I suspect the main issue is HCAP vs atypical PNA vs underlying interstitial lung  disease/BOOP? -stable on 2L>>>1L -d/c levoflox -continue zosyn and azithro -CTA chest neg for PE--as discussed above -personally reviewed EKG--sinus T wave inversion V4-6 -personally reviewed CXR--no edema or consolidation -consult pulmonary medicine--similar presentation in Aug 2018 with similar CT chest findings -09/28/17--case discussed with Pulm, Dr. Lanae Boast stable for d/c today--work up BOOP/autoimmune causes -09/28/17--pt desaturated to 79% with ambulation on RA  HCAP -check procalcitonin--1.96>>>1.46>>4.11 -viral respiratory panel--neg -influenza PCR--neg -start zosyn--question aspiration with recurrent vomiting  Fluid overload/ESRD -HD per nephrology -pt compliant with HD -Continue Sensipar and Renvela -Echo EF 60-65%, no WMA, normal RV  Diabetes mellitus type 2, controlled -Holding glipizide -check hemoglobin A1c --5.3 -NovoLog sliding scale   essential hypertension  -Continue labetalol, nifedipine   Nausea and vomiting -improved -May have been related to the patient's clindamycin -now tolerating diet  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 She denies any fevers, chills, headache, chest pain, nausea, vomiting, diarrhea, abdominal pain, dysuria, hematuria. Denies any headaches or neck pain.  Objective: Vitals:   09/28/17 0200 09/28/17 0300 09/28/17 0400 09/28/17 0756  BP: (!) 168/71 (!) 166/78    Pulse:    80  Resp: (!) 25 (!) 22  15  Temp:   98.5  F (36.9 C) 98.6  F (37 C)  TempSrc:   Oral Oral  SpO2:    97%  Weight:   111.8 kg (246 lb 7.6 oz)   Height:        Intake/Output Summary (Last 24 hours) at 09/28/17 1015 Last data filed at 09/28/17 1013  Gross per 24 hour  Intake              590 ml  Output             3040 ml  Net            -2450 ml   Weight change: 0 kg (0 lb) Exam:   General:  Pt is alert, follows commands appropriately, not in acute distress  HEENT: No icterus, No thrush, No neck mass, [place]/AT  Cardiovascular: RRR, S1/S2, no rubs, no gallops  Respiratory: fine bibasilar crackles. No wheeze.  Abdomen: Soft/+BS, non tender, non distended, no guarding  Extremities: No edema, No lymphangitis, No petechiae, No rashes, no synovitis   Data Reviewed: I have personally reviewed following labs and imaging studies Basic Metabolic Panel:  Recent Labs Lab 09/25/17 1510 09/26/17 0302 09/27/17 0412 09/28/17 0513  NA 136 138 137 136  K 3.4* 4.5 4.9 4.0  CL 97* 100* 99* 95*  CO2 26 25 25 27   GLUCOSE 206* 82 115* 203*  BUN 15 22* 34* 24*  CREATININE 5.25* 6.95* 9.54* 7.46*  CALCIUM 8.9 8.9 8.1* 8.2*   Liver Function Tests:  Recent Labs Lab 09/26/17 0302  AST 18  ALT 8*  ALKPHOS 79  BILITOT 0.4  PROT 7.3  ALBUMIN 3.7    Recent Labs Lab 09/26/17 0302  LIPASE 41   No results for input(s): AMMONIA in the last 168 hours. Coagulation Profile: No results for input(s): INR, PROTIME in the last 168 hours. CBC:  Recent Labs Lab 09/25/17 1510 09/26/17 0302 09/27/17 0412  WBC 11.4* 10.4 9.4  NEUTROABS 8.8* 6.9  --   HGB 8.7* 8.6* 8.3*  HCT 26.9* 27.8* 27.3*  MCV 91.8 93.9 95.5  PLT 300 285 305   Cardiac Enzymes:  Recent Labs Lab 09/25/17 1514  TROPONINI 0.04*   BNP: Invalid input(s): POCBNP CBG:  Recent Labs Lab 09/27/17 0734 09/27/17 1216 09/27/17 1702 09/27/17 2154 09/28/17 0755  GLUCAP 103* 108* 150* 190* 178*   HbA1C:  Recent Labs  09/26/17 0302  HGBA1C 5.3    Urine analysis:    Component Value Date/Time   COLORURINE YELLOW 09/14/2016 1915   APPEARANCEUR CLEAR 09/14/2016 1915   LABSPEC 1.015 09/14/2016 1915   PHURINE 7.5 09/14/2016 1915   GLUCOSEU >1000 (A) 09/14/2016 1915   HGBUR TRACE (A) 09/14/2016 1915   BILIRUBINUR NEGATIVE 09/14/2016 [place] NEGATIVE 09/14/2016 1915   PROTEINUR >300 (A) 09/14/2016 1915   NITRITE NEGATIVE 09/14/2016 1915   LEUKOCYTESUR NEGATIVE 09/14/2016 1915   Sepsis Labs: @LABRCNTIP (procalcitonin:4,lacticidven:4) ) Recent Results (from the past 240 hour(s))  MRSA PCR Screening     Status: None   Collection Time: 09/25/17 10:10 PM  Result Value Ref Range Status   MRSA by PCR NEGATIVE NEGATIVE Final    Comment:        The GeneXpert MRSA Assay (FDA approved for NASAL specimens only), is one component of a comprehensive MRSA colonization surveillance program. It is not intended to diagnose MRSA infection nor to guide or monitor treatment for MRSA infections.   Respiratory Panel by PCR     Status: None   Collection Time: 09/26/17  8:37 AM  Result Value Ref Range Status   Adenovirus NOT DETECTED NOT DETECTED Final   Coronavirus 229E NOT DETECTED NOT DETECTED Final   Coronavirus HKU1 NOT DETECTED NOT DETECTED Final   Coronavirus NL63 NOT DETECTED NOT DETECTED Final   Coronavirus OC43 NOT DETECTED NOT DETECTED Final   Metapneumovirus NOT DETECTED NOT DETECTED Final   Rhinovirus / Enterovirus NOT DETECTED NOT DETECTED Final   Influenza A NOT DETECTED NOT DETECTED Final   Influenza B NOT DETECTED NOT DETECTED Final   Parainfluenza Virus 1 NOT DETECTED NOT DETECTED Final   Parainfluenza Virus 2 NOT DETECTED NOT DETECTED Final   Parainfluenza Virus 3 NOT DETECTED NOT DETECTED Final   Parainfluenza Virus 4 NOT DETECTED NOT DETECTED Final   Respiratory Syncytial Virus NOT DETECTED NOT DETECTED Final   Bordetella pertussis NOT DETECTED NOT DETECTED Final   Chlamydophila pneumoniae NOT DETECTED NOT  DETECTED Final   Mycoplasma pneumoniae NOT DETECTED NOT DETECTED Final    Comment: Performed at [facility], [place] [address] [place], [place] [address]     Scheduled Meds: . azithromycin  500 mg Oral Q24H  . chlorhexidine  15 mL Mouth/Throat BID  . cinacalcet  60 mg Oral QPM  . epoetin (EPOGEN/PROCRIT) injection  8,000 Units Subcutaneous Q M,W,F-HD  . ferric citrate  420 mg Oral BID WC  . fluticasone  2 spray Each Nare Daily  . insulin aspart  0-9 Units Subcutaneous TID WC  . labetalol  200 mg Oral BID  . lubiprostone  24 mcg Oral BID WC  . multivitamin  1 tablet Oral Daily  . NIFEdipine  60 mg Oral Daily  . pantoprazole  40 mg Oral Daily  . rOPINIRole  2 mg Oral QHS  . sevelamer carbonate  1,600 mg Oral TID WC   Continuous Infusions: . sodium chloride    . sodium chloride    . piperacillin-tazobactam (ZOSYN)  IV Stopped (09/28/17 0356)    Procedures/Studies: Dg Chest 2 View  Result Date: 09/25/2017 CLINICAL DATA:  Shortness of breath and cough for 2 days. EXAM: CHEST  2 VIEW COMPARISON:  PA and lateral chest and CT chest 07/21/2017. FINDINGS: The lungs are clear. Heart size is normal. No pneumothorax or pleural fluid. No acute bony abnormality. Surgical clips left breast are identified. IMPRESSION: No acute disease. Electronically Signed   By: Inge Rise M.D.   On: 09/25/2017 15:43   Ct Angio Chest Pe W And/or Wo Contrast  Result Date: 09/25/2017 CLINICAL DATA:  Cough and shortness of breath. Breast cancer with recent mastectomy. Recent pneumonia. PE suspected, high pretest probability. EXAM: CT ANGIOGRAPHY CHEST WITH CONTRAST TECHNIQUE: Multidetector CT imaging of the chest was performed using the standard protocol during bolus administration of intravenous contrast. Multiplanar CT image reconstructions and MIPs were obtained to evaluate the vascular anatomy. CONTRAST:  100 mL Isovue 370 COMPARISON:  CTA chest 07/21/2017 FINDINGS: Cardiovascular: The heart is  enlarged. Minimal pericardial fluid is likely physiologic. Minimal atherosclerotic changes are noted at the aortic arch without aneurysm or focal stenosis. Pulmonary artery opacification is excellent. No focal filling defects are present to suggest pulmonary embolus. Mediastinum/Nodes: Peritracheal and subcarinal adenopathy is stable. No significant hilar adenopathy is present. No significant axillary adenopathy is present. Lungs/Pleura: Patchy bilateral airspace disease is present bilaterally, right greater than left. There is diffuse ground-glass attenuation. No significant effusions are present. Upper Abdomen: The patient is status post cholecystectomy. Musculoskeletal: Vertebral body heights and alignment are normal. No focal lytic or blastic lesions  are present. The ribs are unremarkable. Previous fluid collection in the left breast has been drained. Surgical drains remain in place. Review of the MIP images confirms the above findings. IMPRESSION: 1. No evidence pulmonary embolus. 2. Diffuse ground-glass attenuation likely reflecting edema and congestive heart failure. 3. Patchy bibasilar airspace disease likely reflects atelectasis. Infection is considered less likely. 4. Mediastinal adenopathy is stable, likely reactive. 5. Interval drainage of left breast fluid collection. Electronically Signed   By: San Morelle M.D.   On: 09/25/2017 18:31    Georg Ang, DO  [facility] Pager [phone]  If 7PM-7AM, please contact night-coverage [email or web address] Password [email or web address] 09/28/2017, 10:15 AM   LOS: 0 days

## 2017-09-28 NOTE — Progress Notes (Signed)
Patricia [name]  MRN: [id number]  DOB/AGE: March 14, 1971 46 y.o.  Primary Care Physician:Jackson, Hulen Shouts, PA-C  Admit date: 09/25/2017  Chief Complaint:  No chief complaint on file.   S-Pt presented on  09/25/2017 with  No chief complaint on file. .    Pt today feels better.    Pt offers no new complaints   Meds . azithromycin  500 mg Oral Q24H  . chlorhexidine  15 mL Mouth/Throat BID  . cinacalcet  60 mg Oral QPM  . epoetin (EPOGEN/PROCRIT) injection  8,000 Units Subcutaneous Q M,W,F-HD  . ferric citrate  420 mg Oral BID WC  . fluticasone  2 spray Each Nare Daily  . insulin aspart  0-9 Units Subcutaneous TID WC  . labetalol  200 mg Oral BID  . lubiprostone  24 mcg Oral BID WC  . multivitamin  1 tablet Oral Daily  . NIFEdipine  60 mg Oral Daily  . pantoprazole  40 mg Oral Daily  . rOPINIRole  2 mg Oral QHS  . sevelamer carbonate  1,600 mg Oral TID WC     Physical Exam: Vital signs in last 24 hours: Temp:  [97.7 F (36.5 C)-99.2 F (37.3 C)] 98.6 F (37 C) (10/13 0756) Pulse Rate:  [73-90] 80 (10/13 0756) Resp:  [13-32] 15 (10/13 0756) BP: (133-168)/(66-88) 166/78 (10/13 0300) SpO2:  [88 %-100 %] 97 % (10/13 0756) Weight:  [245 lb 6 oz (111.3 kg)-252 lb 3.3 oz (114.4 kg)] 246 lb 7.6 oz (111.8 kg) (10/13 0400) Weight change: 0 lb (0 kg) Last BM Date: 09/25/17  Intake/Output from previous day: 10/12 0701 - 10/13 0700 In: 600 [P.O.:500; IV Piggyback:100] Out: 3035 [Drains:35] Total I/O In: 240 [P.O.:240] Out: -    Physical Exam: General- pt is awake,alert, oriented to time place and person Resp- No acute REsp distress,  NO Rhonchi CVS- S1S2 regular in rate and rhythm GIT- BS+, soft, NT, ND EXT- NO LE Edema, NO Cyanosis Access- Left AVF + bruit  Lab Results: CBC  Recent Labs  09/26/17 0302 09/27/17 0412  WBC 10.4 9.4  HGB 8.6* 8.3*  HCT 27.8* 27.3*  PLT 285 305    BMET  Recent Labs  09/27/17 0412 09/28/17 0513  NA 137 136  K 4.9 4.0   CL 99* 95*  CO2 25 27  GLUCOSE 115* 203*  BUN 34* 24*  CREATININE 9.54* 7.46*  CALCIUM 8.1* 8.2*    MICRO Recent Results (from the past 240 hour(s))  MRSA PCR Screening     Status: None   Collection Time: 09/25/17 10:10 PM  Result Value Ref Range Status   MRSA by PCR NEGATIVE NEGATIVE Final    Comment:        The GeneXpert MRSA Assay (FDA approved for NASAL specimens only), is one component of a comprehensive MRSA colonization surveillance program. It is not intended to diagnose MRSA infection nor to guide or monitor treatment for MRSA infections.   Respiratory Panel by PCR     Status: None   Collection Time: 09/26/17  8:37 AM  Result Value Ref Range Status   Adenovirus NOT DETECTED NOT DETECTED Final   Coronavirus 229E NOT DETECTED NOT DETECTED Final   Coronavirus HKU1 NOT DETECTED NOT DETECTED Final   Coronavirus NL63 NOT DETECTED NOT DETECTED Final   Coronavirus OC43 NOT DETECTED NOT DETECTED Final   Metapneumovirus NOT DETECTED NOT DETECTED Final   Rhinovirus / Enterovirus NOT DETECTED NOT DETECTED Final   Influenza A NOT DETECTED NOT DETECTED  Final   Influenza B NOT DETECTED NOT DETECTED Final   Parainfluenza Virus 1 NOT DETECTED NOT DETECTED Final   Parainfluenza Virus 2 NOT DETECTED NOT DETECTED Final   Parainfluenza Virus 3 NOT DETECTED NOT DETECTED Final   Parainfluenza Virus 4 NOT DETECTED NOT DETECTED Final   Respiratory Syncytial Virus NOT DETECTED NOT DETECTED Final   Bordetella pertussis NOT DETECTED NOT DETECTED Final   Chlamydophila pneumoniae NOT DETECTED NOT DETECTED Final   Mycoplasma pneumoniae NOT DETECTED NOT DETECTED Final    Comment: Performed at [facility], [place] [address], [place], [place] [address]      Lab Results  Component Value Date   CALCIUM 8.2 (L) 09/28/2017   CAION 0.97 (L) 09/27/2016   PHOS 4.9 (H) 07/24/2017          Impression: 1)Renal  ESRD on HD                Pt is on Mon/Wed/Fri schedule                 Pt was dialyzed yesterday.               No need for Hd today  2)HTN   BP  at goal          Medication-  On Diuretics-Lasix On Calcium Channel Blockers On Alpha and beta Blockers    3)Anemia IN ESRD the goal for  HGb 9--11 HGb is not at goal    4)CKD Mineral-Bone Disorder  Phosphorus at goal. Calcium is  at goal.  5)REsp - admitted with Dyspnea Pneumonia and Fluid overload  clinically better after IV Abx  6)Electrolytes  normokalemic  normonatremic   7)Acid base Co2 at goal      Plan:  Will continue current care NO need of HD today.   Cayleen Benjamin S 09/28/2017, 9:10 AM

## 2017-09-28 NOTE — Consult Note (Signed)
Consult requested by: [facility], Dr. Carles Collet Consult requested for: Abnormal chest CT  HPI: This is a 46 year old who was in her usual state of fair health at home when she developed increasing shortness of breath some cough and congestion. She had to sit up to sleep. She's been weak and tired and has coughed up sputum. No definite fever at home. No chest pain  no diarrhea she's not having any dysuria. She has had some headache and had nausea that she associates with clindamycin She says she has had episodes of volume overload before and this did not feel like volume overload. She did go to dialysis and had some fluid removed but the physician's assistant at the dialysis center felt that this was not volume overload and suggested that she come to the emergency department which she did. At baseline she has breast cancer with recent mastectomy diabetes, end-stage renal disease on dialysis. She is not a smoker. She doesn't know of any family history of any sort of lung problems. She is not at high risk for aspiration.  Past Medical History:  Diagnosis Date  . Blood transfusion without reported diagnosis   . Cancer ([place])   . Diabetes mellitus without complication ([place])   . Dialysis patient ([place])    mon, wed, friday,   . Hypertension   . Renal disorder   . Renal insufficiency      Family History  Problem Relation Age of Onset  . Diabetes Mellitus II Mother   . Hypertension Mother   . Hypertension Sister   . Hypertension Sister   . Colon cancer Neg Hx      Social History   Social History  . Marital status: Married    Spouse name: N/A  . Number of children: N/A  . Years of education: N/A   Social History Main Topics  . Smoking status: Never Smoker  . Smokeless tobacco: Never Used  . Alcohol use No  . Drug use: No  . Sexual activity: Not Asked   Other Topics Concern  . None   Social History Narrative  . None     ROS: Except as mentioned 10 point review of systems is  negative    Objective: Vital signs in last 24 hours: Temp:  [97.7 F (36.5 C)-99.2 F (37.3 C)] 98.6 F (37 C) (10/13 0756) Pulse Rate:  [73-90] 80 (10/13 0756) Resp:  [13-32] 15 (10/13 0756) BP: (133-168)/(66-87) 166/78 (10/13 0300) SpO2:  [88 %-100 %] 97 % (10/13 0756) Weight:  [111.3 kg (245 lb 6 oz)-114.4 kg (252 lb 3.3 oz)] 111.8 kg (246 lb 7.6 oz) (10/13 0400) Weight change: 0 kg (0 lb) Last BM Date: 09/25/17  Intake/Output from previous day: 10/12 0701 - 10/13 0700 In: 600 [P.O.:500; IV Piggyback:100] Out: 3035 [Drains:35]  PHYSICAL EXAM Constitutional: She is awake and alert and in no acute distress. She is obese. Eyes: Pupils react. EOMI. Ears nose mouth and throat: Her mucous membranes are moist. Hearing is grossly normal. Throat is clear. Cardiovascular: Her heart is regular with normal heart sounds. She does not have any peripheral edema. Respiratory: Her respiratory effort is mildly increased. She has some fine rales in the bases. Gastrointestinal: Her abdomen is soft with no masses. Skin: Warm and dry. Neurological: No focal abnormalities. Psychiatric: Normal mood and affect  Lab Results: Basic Metabolic Panel:  Recent Labs  09/27/17 0412 09/28/17 0513  NA 137 136  K 4.9 4.0  CL 99* 95*  CO2 25 27  GLUCOSE  115* 203*  BUN 34* 24*  CREATININE 9.54* 7.46*  CALCIUM 8.1* 8.2*   Liver Function Tests:  Recent Labs  09/26/17 0302  AST 18  ALT 8*  ALKPHOS 79  BILITOT 0.4  PROT 7.3  ALBUMIN 3.7    Recent Labs  09/26/17 0302  LIPASE 41   No results for input(s): AMMONIA in the last 72 hours. CBC:  Recent Labs  09/25/17 1510 09/26/17 0302 09/27/17 0412  WBC 11.4* 10.4 9.4  NEUTROABS 8.8* 6.9  --   HGB 8.7* 8.6* 8.3*  HCT 26.9* 27.8* 27.3*  MCV 91.8 93.9 95.5  PLT 300 285 305   Cardiac Enzymes:  Recent Labs  09/25/17 1514  TROPONINI 0.04*   BNP: No results for input(s): PROBNP in the last 72 hours. D-Dimer: No results for input(s):  DDIMER in the last 72 hours. CBG:  Recent Labs  09/26/17 2056 09/27/17 0734 09/27/17 1216 09/27/17 1702 09/27/17 2154 09/28/17 0755  GLUCAP 156* 103* 108* 150* 190* 178*   Hemoglobin A1C:  Recent Labs  09/26/17 0302  HGBA1C 5.3   Fasting Lipid Panel: No results for input(s): CHOL, HDL, LDLCALC, TRIG, CHOLHDL, LDLDIRECT in the last 72 hours. Thyroid Function Tests: No results for input(s): TSH, T4TOTAL, FREET4, T3FREE, THYROIDAB in the last 72 hours. Anemia Panel: No results for input(s): VITAMINB12, FOLATE, FERRITIN, TIBC, IRON, RETICCTPCT in the last 72 hours. Coagulation: No results for input(s): LABPROT, INR in the last 72 hours. Urine Drug Screen: Drugs of Abuse  No results found for: LABOPIA, COCAINSCRNUR, LABBENZ, AMPHETMU, THCU, LABBARB  Alcohol Level: No results for input(s): ETH in the last 72 hours. Urinalysis: No results for input(s): COLORURINE, LABSPEC, PHURINE, GLUCOSEU, HGBUR, BILIRUBINUR, KETONESUR, PROTEINUR, UROBILINOGEN, NITRITE, LEUKOCYTESUR in the last 72 hours.  Invalid input(s): APPERANCEUR Misc. Labs:   ABGS: No results for input(s): PHART, PO2ART, TCO2, HCO3 in the last 72 hours.  Invalid input(s): PCO2   MICROBIOLOGY: Recent Results (from the past 240 hour(s))  MRSA PCR Screening     Status: None   Collection Time: 09/25/17 10:10 PM  Result Value Ref Range Status   MRSA by PCR NEGATIVE NEGATIVE Final    Comment:        The GeneXpert MRSA Assay (FDA approved for NASAL specimens only), is one component of a comprehensive MRSA colonization surveillance program. It is not intended to diagnose MRSA infection nor to guide or monitor treatment for MRSA infections.   Respiratory Panel by PCR     Status: None   Collection Time: 09/26/17  8:37 AM  Result Value Ref Range Status   Adenovirus NOT DETECTED NOT DETECTED Final   Coronavirus 229E NOT DETECTED NOT DETECTED Final   Coronavirus HKU1 NOT DETECTED NOT DETECTED Final    Coronavirus NL63 NOT DETECTED NOT DETECTED Final   Coronavirus OC43 NOT DETECTED NOT DETECTED Final   Metapneumovirus NOT DETECTED NOT DETECTED Final   Rhinovirus / Enterovirus NOT DETECTED NOT DETECTED Final   Influenza A NOT DETECTED NOT DETECTED Final   Influenza B NOT DETECTED NOT DETECTED Final   Parainfluenza Virus 1 NOT DETECTED NOT DETECTED Final   Parainfluenza Virus 2 NOT DETECTED NOT DETECTED Final   Parainfluenza Virus 3 NOT DETECTED NOT DETECTED Final   Parainfluenza Virus 4 NOT DETECTED NOT DETECTED Final   Respiratory Syncytial Virus NOT DETECTED NOT DETECTED Final   Bordetella pertussis NOT DETECTED NOT DETECTED Final   Chlamydophila pneumoniae NOT DETECTED NOT DETECTED Final   Mycoplasma pneumoniae NOT DETECTED NOT DETECTED  Final    Comment: Performed at [facility], [place] [address] [place],  [address]    Studies/Results: No results found.  Medications:  Prior to Admission:  Prescriptions Prior to Admission  Medication Sig Dispense Refill Last Dose  . acetaminophen (TYLENOL) 500 MG tablet Take 500 mg by mouth 3 (three) times daily as needed for moderate pain.   09/25/2017 at [place]  . AMITIZA 24 MCG capsule TAKE 1 CAPSULE (24 MCG TOTAL) BY MOUTH 2 (TWO) TIMES DAILY WITH A MEAL. 60 capsule 5 09/24/2017 at Unknown time  . chlorhexidine (PERIDEX) 0.12 % solution Swish and spit 15 mLs 2 (two) times daily.   09/25/2017 at Unknown time  . clindamycin (CLEOCIN) 300 MG capsule Take 300 mg by mouth 3 (three) times daily. 10 day course starting on 09/14/2017   09/25/2017 at Unknown time  . Ferric Citrate (AURYXIA) 1 GM 210 MG(Fe) TABS Take 2 tablets by mouth 2 (two) times daily with a meal.   09/25/2017 at Unknown time  . fluticasone (FLONASE) 50 MCG/ACT nasal spray Place 2 sprays into both nostrils daily. LEFT NARE ONLY   09/25/2017 at Unknown time  . furosemide (LASIX) 40 MG tablet Take 40-80 mg by mouth 2 (two) times daily. 80mg  in the morning and 40mg  at bedtime    09/25/2017 at Unknown time  . glipiZIDE (GLUCOTROL) 5 MG tablet Take 5 mg by mouth 2 (two) times daily as needed. For blood sugar levels over 150   09/25/2017 at Unknown time  . labetalol (NORMODYNE) 200 MG tablet Take 200 mg by mouth 2 (two) times daily.   09/25/2017 at 1230a  . multivitamin (RENA-VIT) TABS tablet Take 1 tablet by mouth daily.   09/25/2017 at Unknown time  . NIFEdipine (PROCARDIA-XL/ADALAT CC) 60 MG 24 hr tablet Take 1 tablet (60 mg total) by mouth daily. 30 tablet 2 09/24/2017 at Unknown time  . ondansetron (ZOFRAN-ODT) 4 MG disintegrating tablet Take 4 mg by mouth every 8 (eight) hours as needed for nausea or vomiting.   09/24/2017 at Unknown time  . pantoprazole (PROTONIX) 20 MG tablet Take 20 mg by mouth daily.  1 09/24/2017 at Unknown time  . rOPINIRole (REQUIP XL) 2 MG 24 hr tablet Take 2 mg by mouth at bedtime.    09/24/2017 at Unknown time  . SENSIPAR 60 MG tablet Take 60 mg by mouth every evening.   09/24/2017 at Unknown time  . sevelamer carbonate (RENVELA) 800 MG tablet Take 1,600 mg by mouth 3 (three) times daily with meals.    09/25/2017 at Unknown time  . oxyCODONE (OXY IR/ROXICODONE) 5 MG immediate release tablet Take 5 mg by mouth daily as needed for moderate pain (patient has on hand but does not take).       Scheduled: . azithromycin  500 mg Oral Q24H  . chlorhexidine  15 mL Mouth/Throat BID  . cinacalcet  60 mg Oral QPM  . epoetin (EPOGEN/PROCRIT) injection  8,000 Units Subcutaneous Q M,W,F-HD  . ferric citrate  420 mg Oral BID WC  . fluticasone  2 spray Each Nare Daily  . insulin aspart  0-9 Units Subcutaneous TID WC  . labetalol  200 mg Oral BID  . lubiprostone  24 mcg Oral BID WC  . multivitamin  1 tablet Oral Daily  . NIFEdipine  60 mg Oral Daily  . pantoprazole  40 mg Oral Daily  . rOPINIRole  2 mg Oral QHS  . sevelamer carbonate  1,600 mg Oral TID WC  Continuous: . sodium chloride    . sodium chloride    . piperacillin-tazobactam (ZOSYN)  IV  Stopped (09/28/17 0356)   DHW:YSHUOH chloride, sodium chloride, acetaminophen, heparin, ipratropium-albuterol, lidocaine (PF), lidocaine-prilocaine, morphine injection, ondansetron **OR** ondansetron (ZOFRAN) IV, oxyCODONE, pentafluoroprop-tetrafluoroeth, promethazine  Assesment: She was admitted with healthcare associated pneumonia. There was some question of volume overload but that seems less likely. She had a very similar episode in August of this year. She has diabetes has had recent breast cancer and has end-stage renal disease on dialysis so she is likely not totally immunocompetent. It does not appear that she has aspirated. This could simply be pneumonia but I agree that it could be vasculitis or other pulmonary inflammatory disease. Principal Problem:   Volume overload Active Problems:   HCAP (healthcare-associated pneumonia)   End stage renal disease on dialysis ([place])   Diabetes mellitus ([place])   Essential hypertension   GERD (gastroesophageal reflux disease)   Acute respiratory failure with hypoxia ([facility])   Breast cancer ([facility])   Acute on chronic diastolic CHF (congestive heart failure) ([facility])   Lactic acidosis   Hypokalemia   CAP (community acquired pneumonia)    Plan: Check immunoglobulin levels. Check sedimentation rate. Her sedimentation rate will be elevated because of her pneumonia but if it is exceedingly high like 100 or so then it may lead more to a diagnosis of something like chronic organizing pneumonia formerly known as BOOP.  Thanks for allowing me to see her with you    LOS: 0 days   Patricia [name] L 09/28/2017, 10:07 AM

## 2017-09-29 DIAGNOSIS — E875 Hyperkalemia: Secondary | ICD-10-CM

## 2017-09-29 LAB — BASIC METABOLIC PANEL
Anion gap: 16 — ABNORMAL HIGH (ref 5–15)
BUN: 37 mg/dL — ABNORMAL HIGH (ref 6–20)
CHLORIDE: 97 mmol/L — AB (ref 101–111)
CO2: 23 mmol/L (ref 22–32)
CREATININE: 10.12 mg/dL — AB (ref 0.44–1.00)
Calcium: 8 mg/dL — ABNORMAL LOW (ref 8.9–10.3)
GFR calc non Af Amer: 4 mL/min — ABNORMAL LOW (ref >60)
GFR, EST AFRICAN AMERICAN: 5 mL/min — AB (ref >60)
Glucose, Bld: 209 mg/dL — ABNORMAL HIGH (ref 65–99)
Potassium: 5.5 mmol/L — ABNORMAL HIGH (ref 3.5–5.1)
Sodium: 136 mmol/L (ref 135–145)

## 2017-09-29 LAB — IGG, IGA, IGM
IGM (IMMUNOGLOBULIN M), SRM: 83 mg/dL (ref 26–217)
IgA: 194 mg/dL (ref 87–352)
IgG (Immunoglobin G), Serum: 865 mg/dL (ref 700–1600)

## 2017-09-29 LAB — GLUCOSE, CAPILLARY
GLUCOSE-CAPILLARY: 129 mg/dL — AB (ref 65–99)
GLUCOSE-CAPILLARY: 238 mg/dL — AB (ref 65–99)
Glucose-Capillary: 201 mg/dL — ABNORMAL HIGH (ref 65–99)
Glucose-Capillary: 129 mg/dL — ABNORMAL HIGH (ref 65–99)

## 2017-09-29 LAB — LEGIONELLA PNEUMOPHILA SEROGP 1 UR AG: L. pneumophila Serogp 1 Ur Ag: NEGATIVE

## 2017-09-29 LAB — STREP PNEUMONIAE URINARY ANTIGEN: STREP PNEUMO URINARY ANTIGEN: NEGATIVE

## 2017-09-29 MED ORDER — AZITHROMYCIN 500 MG PO TABS: 500.0000 mg | ORAL_TABLET | Freq: Every day | ORAL | 0 refills | Status: DC

## 2017-09-29 MED ORDER — AMOXICILLIN-POT CLAVULANATE 500-125 MG PO TABS
1.0000 | ORAL_TABLET | Freq: Two times a day (BID) | ORAL | Status: DC
  Administered 2017-09-29: 500 mg via ORAL
  Filled 2017-09-29 (×2): qty 1

## 2017-09-29 MED ORDER — AMOXICILLIN-POT CLAVULANATE 500-125 MG PO TABS: 1.0000 | ORAL_TABLET | Freq: Two times a day (BID) | ORAL | 0 refills | Status: DC

## 2017-09-29 NOTE — Progress Notes (Signed)
Tonea Leiphart  MRN: [id number]  DOB/AGE: 1971-05-27 46 y.o.  Primary Care Physician:Jackson, Hulen Shouts, PA-C  Admit date: 09/25/2017  Chief Complaint:  No chief complaint on file.   S-Pt presented on  09/25/2017 with  No chief complaint on file. .    Pt today feels better.    Pt offers no new complaints.    Meds . azithromycin  500 mg Oral Q24H  . chlorhexidine  15 mL Mouth/Throat BID  . cinacalcet  60 mg Oral QPM  . epoetin (EPOGEN/PROCRIT) injection  8,000 Units Subcutaneous Q M,W,F-HD  . ferric citrate  420 mg Oral BID WC  . fluticasone  2 spray Each Nare Daily  . insulin aspart  0-9 Units Subcutaneous TID WC  . labetalol  200 mg Oral BID  . lubiprostone  24 mcg Oral BID WC  . multivitamin  1 tablet Oral Daily  . NIFEdipine  60 mg Oral Daily  . pantoprazole  40 mg Oral Daily  . rOPINIRole  2 mg Oral QHS  . sevelamer carbonate  1,600 mg Oral TID WC     Physical Exam: Vital signs in last 24 hours: Temp:  [98.4 F (36.9 C)-99 F (37.2 C)] 98.4 F (36.9 C) (10/14 0434) Pulse Rate:  [80-88] 88 (10/14 0434) Resp:  [18-23] 18 (10/14 0434) BP: (134-141)/(65-66) 134/65 (10/14 0434) SpO2:  [97 %-99 %] 97 % (10/14 0434) Weight:  [246 lb 7.6 oz (111.8 kg)] 246 lb 7.6 oz (111.8 kg) (10/14 0434) Weight change: -5 lb 11.7 oz (-2.6 kg) Last BM Date: 09/25/17  Intake/Output from previous day: 10/13 0701 - 10/14 0700 In: 280 [P.O.:240] Out: 5 [Drains:5] No intake/output data recorded.   Physical Exam: General- pt is awake,alert, oriented to time place and person Resp- No acute REsp distress,  NO Rhonchi CVS- S1S2 regular in rate and rhythm GIT- BS+, soft, NT, ND EXT- NO LE Edema, NO Cyanosis Access- Left AVF + bruit  Lab Results: CBC  Recent Labs  09/27/17 0412 09/28/17 1118  WBC 9.4 10.3  HGB 8.3* 8.7*  HCT 27.3* 27.8*  PLT 305 308    BMET  Recent Labs  09/28/17 0513 09/29/17 0643  NA 136 136  K 4.0 5.5*  CL 95* 97*  CO2 27 23  GLUCOSE 203*  209*  BUN 24* 37*  CREATININE 7.46* 10.12*  CALCIUM 8.2* 8.0*    MICRO Recent Results (from the past 240 hour(s))  MRSA PCR Screening     Status: None   Collection Time: 09/25/17 10:10 PM  Result Value Ref Range Status   MRSA by PCR NEGATIVE NEGATIVE Final    Comment:        The GeneXpert MRSA Assay (FDA approved for NASAL specimens only), is one component of a comprehensive MRSA colonization surveillance program. It is not intended to diagnose MRSA infection nor to guide or monitor treatment for MRSA infections.   Respiratory Panel by PCR     Status: None   Collection Time: 09/26/17  8:37 AM  Result Value Ref Range Status   Adenovirus NOT DETECTED NOT DETECTED Final   Coronavirus 229E NOT DETECTED NOT DETECTED Final   Coronavirus HKU1 NOT DETECTED NOT DETECTED Final   Coronavirus NL63 NOT DETECTED NOT DETECTED Final   Coronavirus OC43 NOT DETECTED NOT DETECTED Final   Metapneumovirus NOT DETECTED NOT DETECTED Final   Rhinovirus / Enterovirus NOT DETECTED NOT DETECTED Final   Influenza A NOT DETECTED NOT DETECTED Final   Influenza B NOT DETECTED NOT  DETECTED Final   Parainfluenza Virus 1 NOT DETECTED NOT DETECTED Final   Parainfluenza Virus 2 NOT DETECTED NOT DETECTED Final   Parainfluenza Virus 3 NOT DETECTED NOT DETECTED Final   Parainfluenza Virus 4 NOT DETECTED NOT DETECTED Final   Respiratory Syncytial Virus NOT DETECTED NOT DETECTED Final   Bordetella pertussis NOT DETECTED NOT DETECTED Final   Chlamydophila pneumoniae NOT DETECTED NOT DETECTED Final   Mycoplasma pneumoniae NOT DETECTED NOT DETECTED Final    Comment: Performed at [facility], [place] [address], [place], [place] [address]      Lab Results  Component Value Date   CALCIUM 8.0 (L) 09/29/2017   CAION 0.97 (L) 09/27/2016   PHOS 4.9 (H) 07/24/2017          Impression: 1)Renal  ESRD on HD                Pt is on Mon/Wed/Fri schedule                Pt was dialyzed Friday.                 No need for Hd today  2)HTN   BP  at goal          Medication-  On Diuretics-Lasix On Calcium Channel Blockers On Alpha and beta Blockers    3)Anemia IN ESRD the goal for  HGb 9--11 HGb is not at goal    4)CKD Mineral-Bone Disorder  Phosphorus at goal. Calcium is  at goal.  5)REsp - admitted with Dyspnea Pneumonia and Fluid overload  clinically better after IV Abx  6)Electrolytes  Hyperkalemic  sec to ESRD    normonatremic   7)Acid base Co2 at goal      Plan:  I discussed with primary team,that it would be better for pt to stay and we can recheck her K in am and dialyze her as inpt.Pwas insistent on going home but later did agee to stay. Will dialyze in am with 2 k bath.   Emmamarie Kluender S 09/29/2017, 10:10 AM

## 2017-09-29 NOTE — Progress Notes (Signed)
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 The patient states that she began feeling weak and tired on 10/07/2018with decreased oral intake and some mild dyspnea on exertion. The patient went to dialysis and stated for the whole duration on 09/23/2017. She felt somewhat better with improving shortness of breath. However on the evening of 09/24/2018, shortness of breath worsened to the point where she was not able to lie flat. She had a full dialysis session on 09/25/2018. She remained short of breath. As a result, the patient presented to the emergency department for further evaluation. The patient states that she had a fever of 101.75F on 09/20/2017, and has had low-grade temperatures since that period of time. She states that her dad has been sick with upper respiratory type symptoms. She has been complaining of continued coughing with gray sputum. She has been coughing to the point of emesis. The patient states that she has chronic nausea and vomiting, but this had worsened since she started on clindamycin with which she was discharged from [facility]. The patient was told she was given clindamycin for pneumonia. In emergency department, the patient was hypoxic with saturation of 80%. CT angiogram of the chest was negative for pulmonary embolus but showed bilateral bibasilar patchy airspace disease right greater than left with diffuse groundglass opacities.  Assessment/Plan: Acute respiratory failure with hypoxia -While the patient may have a mild degree of fluid overload, I suspect the main issue isHCAP vs atypical PNA vs underlying interstitial lung  disease/BOOP? -stable on 2L>>>1L -d/c levoflox -continuezosyn and azithro--de-escalated to amox/clav and azithro -CTA chest neg for PE--as discussed above -consult pulmonary medicine--similar presentation in Aug 2018 with similar CT chest findings -09/29/17--case discussed with Pulm, Dr. Alvina Chou to d/c, follow up office -09/29/17--pt desaturated to 86% with ambulation on RA  Hyperkalemia -09/29/17--discussed with Dr. Adan Sis d/c for HD on 09/30/17; pt refuses kayexalate -remain on tele  HCAP -check procalcitonin--1.96>>>1.46>>4.11 -viral respiratory panel--neg -influenza PCR--neg -start zosyn--question aspiration with recurrent vomiting-->de-escalate to amox/clav  Fluid overload/ESRD -HD per nephrology -pt compliant with HD -Continue Sensipar and Renvela -EchoEF 60-65%, no WMA, normal RV  Diabetes mellitus type 2, controlled -Holding glipizide -check hemoglobin A1c --5.3 -NovoLog sliding scale   essential hypertension  -Continue labetalol, nifedipine   Nausea and vomiting -improved -May have been related to the patient's clindamycin -now tolerating diet  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   Objective: Vitals:   09/28/17 1114 09/28/17 1616 09/28/17 2111 09/29/17 0434  BP:   (!) 141/66 134/65  Pulse: 80  87 88  Resp: (!) 23  19 18   Temp: 98.4 F (36.9 C) 99 F (37.2 C) 98.5 F (36.9 C) 98.4  F (36.9 C)  TempSrc: Oral Oral  SpO2: 99%  99% 97%  Weight:    111.8 kg (246 lb 7.6 oz)  Height:        Intake/Output Summary (Last 24 hours) at 09/29/17 1049 Last data filed at 09/28/17 2200  Gross per 24 hour  Intake               40 ml  Output                0 ml  Net               40 ml   Weight change: -2.6 kg (-5 lb 11.7 oz) Exam:   General:  Pt is alert, follows commands appropriately, not in acute distress  HEENT: No icterus, No thrush, No neck mass, Bandana/AT  Cardiovascular: RRR, S1/S2, no rubs, no gallops  Respiratory: CTA bilaterally, no wheezing, no crackles, no rhonchi  Abdomen: Soft/+BS, non tender, non distended, no guarding  Extremities: No edema, No lymphangitis, No petechiae, No rashes, no synovitis   Data Reviewed: I have personally reviewed following labs and imaging studies Basic Metabolic Panel:  Recent Labs Lab 09/25/17 1510 09/26/17 0302 09/27/17 0412 09/28/17 0513 09/29/17 0643  NA 136 138 137 136 136  K 3.4* 4.5 4.9 4.0 5.5*  CL 97* 100* 99* 95* 97*  CO2 26 25 25 27 23   GLUCOSE 206* 82 115* 203* 209*  BUN 15 22* 34* 24* 37*  CREATININE 5.25* 6.95* 9.54* 7.46* 10.12*  CALCIUM 8.9 8.9 8.1* 8.2* 8.0*   Liver Function Tests:  Recent Labs Lab 09/26/17 0302  AST 18  ALT 8*  ALKPHOS 79  BILITOT 0.4  PROT 7.3  ALBUMIN 3.7    Recent Labs Lab 09/26/17 0302  LIPASE 41   No results for input(s): AMMONIA in the last 168 hours. Coagulation Profile: No results for input(s): INR, PROTIME in the last 168 hours. CBC:  Recent Labs Lab 09/25/17 1510 09/26/17 0302 09/27/17 0412 09/28/17 1118  WBC 11.4* 10.4 9.4 10.3  NEUTROABS 8.8* 6.9  --  7.8*  HGB 8.7* 8.6* 8.3* 8.7*  HCT 26.9* 27.8* 27.3* 27.8*  MCV 91.8 93.9 95.5 93.0  PLT 300 285 305 308   Cardiac Enzymes:  Recent Labs Lab 09/25/17 1514  TROPONINI 0.04*   BNP: Invalid input(s): POCBNP CBG:  Recent Labs Lab 09/28/17 0755 09/28/17 1115 09/28/17 1614  09/28/17 2201 09/29/17 0739  GLUCAP 178* 241* 168* 254* 201*   HbA1C: No results for input(s): HGBA1C in the last 72 hours. Urine analysis:    Component Value Date/Time   COLORURINE YELLOW 09/14/2016 1915   APPEARANCEUR CLEAR 09/14/2016 1915   LABSPEC 1.015 09/14/2016 1915   PHURINE 7.5 09/14/2016 1915   GLUCOSEU >1000 (A) 09/14/2016 1915   HGBUR TRACE (A) 09/14/2016 1915   BILIRUBINUR NEGATIVE 09/14/2016 [place] NEGATIVE 09/14/2016 1915   PROTEINUR >300 (A) 09/14/2016 1915   NITRITE NEGATIVE 09/14/2016 1915   LEUKOCYTESUR NEGATIVE 09/14/2016 1915   Sepsis Labs: @LABRCNTIP (procalcitonin:4,lacticidven:4) ) Recent Results (from the past 240 hour(s))  MRSA PCR Screening     Status: None   Collection Time: 09/25/17 10:10 PM  Result Value Ref Range Status   MRSA by PCR NEGATIVE NEGATIVE Final    Comment:        The GeneXpert MRSA Assay (FDA approved for NASAL specimens only), is one component of a comprehensive MRSA colonization surveillance program. It is not intended to diagnose MRSA infection nor to guide or monitor treatment for MRSA infections.  Respiratory Panel by PCR     Status: None   Collection Time: 09/26/17  8:37 AM  Result Value Ref Range Status   Adenovirus NOT DETECTED NOT DETECTED Final   Coronavirus 229E NOT DETECTED NOT DETECTED Final   Coronavirus HKU1 NOT DETECTED NOT DETECTED Final   Coronavirus NL63 NOT DETECTED NOT DETECTED Final   Coronavirus OC43 NOT DETECTED NOT DETECTED Final   Metapneumovirus NOT DETECTED NOT DETECTED Final   Rhinovirus / Enterovirus NOT DETECTED NOT DETECTED Final   Influenza A NOT DETECTED NOT DETECTED Final   Influenza B NOT DETECTED NOT DETECTED Final   Parainfluenza Virus 1 NOT DETECTED NOT DETECTED Final   Parainfluenza Virus 2 NOT DETECTED NOT DETECTED Final   Parainfluenza Virus 3 NOT DETECTED NOT DETECTED Final   Parainfluenza Virus 4 NOT DETECTED NOT DETECTED Final   Respiratory Syncytial Virus NOT  DETECTED NOT DETECTED Final   Bordetella pertussis NOT DETECTED NOT DETECTED Final   Chlamydophila pneumoniae NOT DETECTED NOT DETECTED Final   Mycoplasma pneumoniae NOT DETECTED NOT DETECTED Final    Comment: Performed at [facility], [place] [address], [place], [place] [address]     Scheduled Meds: . azithromycin  500 mg Oral Q24H  . chlorhexidine  15 mL Mouth/Throat BID  . cinacalcet  60 mg Oral QPM  . epoetin (EPOGEN/PROCRIT) injection  8,000 Units Subcutaneous Q M,W,F-HD  . ferric citrate  420 mg Oral BID WC  . fluticasone  2 spray Each Nare Daily  . insulin aspart  0-9 Units Subcutaneous TID WC  . labetalol  200 mg Oral BID  . lubiprostone  24 mcg Oral BID WC  . multivitamin  1 tablet Oral Daily  . NIFEdipine  60 mg Oral Daily  . pantoprazole  40 mg Oral Daily  . rOPINIRole  2 mg Oral QHS  . sevelamer carbonate  1,600 mg Oral TID WC   Continuous Infusions: . sodium chloride    . sodium chloride    . piperacillin-tazobactam (ZOSYN)  IV Stopped (09/29/17 0413)    Procedures/Studies: Dg Chest 2 View  Result Date: 09/25/2017 CLINICAL DATA:  Shortness of breath and cough for 2 days. EXAM: CHEST  2 VIEW COMPARISON:  PA and lateral chest and CT chest 07/21/2017. FINDINGS: The lungs are clear. Heart size is normal. No pneumothorax or pleural fluid. No acute bony abnormality. Surgical clips left breast are identified. IMPRESSION: No acute disease. Electronically Signed   By: Inge Rise M.D.   On: 09/25/2017 15:43   Ct Angio Chest Pe W And/or Wo Contrast  Result Date: 09/25/2017 CLINICAL DATA:  Cough and shortness of breath. Breast cancer with recent mastectomy. Recent pneumonia. PE suspected, high pretest probability. EXAM: CT ANGIOGRAPHY CHEST WITH CONTRAST TECHNIQUE: Multidetector CT imaging of the chest was performed using the standard protocol during bolus administration of intravenous contrast. Multiplanar CT image reconstructions and MIPs were obtained to evaluate  the vascular anatomy. CONTRAST:  100 mL Isovue 370 COMPARISON:  CTA chest 07/21/2017 FINDINGS: Cardiovascular: The heart is enlarged. Minimal pericardial fluid is likely physiologic. Minimal atherosclerotic changes are noted at the aortic arch without aneurysm or focal stenosis. Pulmonary artery opacification is excellent. No focal filling defects are present to suggest pulmonary embolus. Mediastinum/Nodes: Peritracheal and subcarinal adenopathy is stable. No significant hilar adenopathy is present. No significant axillary adenopathy is present. Lungs/Pleura: Patchy bilateral airspace disease is present bilaterally, right greater than left. There is diffuse ground-glass attenuation. No significant effusions are present. Upper Abdomen: The  patient is status post cholecystectomy. Musculoskeletal: Vertebral body heights and alignment are normal. No focal lytic or blastic lesions are present. The ribs are unremarkable. Previous fluid collection in the left breast has been drained. Surgical drains remain in place. Review of the MIP images confirms the above findings. IMPRESSION: 1. No evidence pulmonary embolus. 2. Diffuse ground-glass attenuation likely reflecting edema and congestive heart failure. 3. Patchy bibasilar airspace disease likely reflects atelectasis. Infection is considered less likely. 4. Mediastinal adenopathy is stable, likely reactive. 5. Interval drainage of left breast fluid collection. Electronically Signed   By: San Morelle M.D.   On: 09/25/2017 18:31    Kimmi Acocella, DO  [facility] Pager [phone]  If 7PM-7AM, please contact night-coverage [email or web address] Password [email or web address] 09/29/2017, 10:49 AM   LOS: 1 day

## 2017-09-29 NOTE — Discharge Summary (Signed)
Physician Discharge Summary  Patricia [name] VHQ:[id number] DOB: 1971-06-30 DOA: 09/25/2017  PCP: Jake Samples, PA-C  Admit date: 09/25/2017 Discharge date: 09/30/17  Admitted From: Home Disposition:  Home  Recommendations for Outpatient Follow-up:  1. Follow up with PCP in 1-2 weeks 2. 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 The patient states that she began feeling weak and tired on 10/07/2018with decreased oral intake and some mild dyspnea on exertion. The patient went to dialysis and stated for the whole duration on 09/23/2017. She felt somewhat better with improving shortness of breath. However on the evening of 09/24/2018, shortness of breath worsened to the point where she was not able to lie flat. She had a full dialysis session on 09/25/2018. She remained short of breath. As a result, the patient presented to the emergency department for further evaluation. The patient states that she had a fever of 101.71F on 09/20/2017, and has had low-grade temperatures since that period of time. She states that her dad has been sick with upper respiratory type symptoms. She has been complaining of continued coughing with gray sputum. She has been coughing to the point of emesis. The patient states that she has chronic nausea and vomiting, but this had worsened since she started on clindamycin with which she was discharged from [facility]. The patient was told she was given clindamycin for pneumonia. In emergency department, the patient was hypoxic with saturation of 80%. CT angiogram of the chest was negative for pulmonary embolus but  showed bilateral bibasilar patchy airspace disease right greater than left with diffuse groundglass opacities.  Discharge Diagnoses:  Acute respiratory failure with hypoxia -While the patient may have a mild degree of fluid overload, I suspect the main issue isHCAP vs atypical PNA vs underlying interstitial lung disease/BOOP? -stable on 2L>>>1L -d/c levoflox -continuezosyn and azithro--de-escalated to amox/clav and azithro -CTA chest neg for PE--as discussed above -consult pulmonary medicine--similar presentation in Aug 2018 with similar CT chest findings -09/29/17--case discussed with Pulm, Dr. Alvina Chou to d/c, follow up office -09/30/17--pt desaturated to 88% with ambulation on RA-->set up home oxygen  Hyperkalemia -09/29/17--discussed with Dr. Adan Sis d/c for HD on 09/30/17; pt refuses kayexalate -remain on tele -had HD on 09/30/17  HCAP -check procalcitonin--1.96>>>1.46>>4.11 -viral respiratory panel--neg -influenza PCR--neg -start zosyn--question aspiration with recurrent vomiting-->de-escalate to amox/clav -home with amox/clav and azithro x 3 more days  Fluid overload/ESRD -HD per nephrology -pt compliant with HD -Continue Sensipar and Renvela -EchoEF 60-65%, no WMA, normal RV  Diabetes mellitus type 2, controlled -Holding glipizide--restart after d/c -check hemoglobin A1c --5.3 -NovoLog sliding scale   essential hypertension  -Continue labetalol, nifedipine   Nausea and vomiting -improved -May have been related to the patient's clindamycin -now tolerating diet  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   AMITIZA 24 MCG capsule Generic drug:  lubiprostone TAKE 1 CAPSULE (24 MCG TOTAL) BY MOUTH 2 (TWO) TIMES DAILY WITH A MEAL.   amoxicillin-clavulanate 500-125 MG tablet Commonly known as:  AUGMENTIN Take 1 tablet (500 mg total) by mouth at bedtime.   AURYXIA 1 GM 210 MG(Fe) tablet Generic drug:  ferric citrate Take 2 tablets by mouth 2 (two) times daily with a meal.   azithromycin 500 MG tablet Commonly known as:  ZITHROMAX Take 1 tablet (500 mg total) by mouth daily.   chlorhexidine 0.12 % solution Commonly known as:  PERIDEX Swish and spit 15 mLs 2 (two) times daily.   fluticasone 50 MCG/ACT nasal spray Commonly known as:  FLONASE Place 2 sprays into both nostrils daily. LEFT NARE ONLY   furosemide 40 MG tablet Commonly known as:  LASIX Take 40-80 mg by mouth 2 (two) times daily. 80mg  in the morning and 40mg  at bedtime   glipiZIDE 5 MG tablet Commonly known as:  GLUCOTROL Take 5 mg by mouth 2 (two) times daily as needed. For blood sugar levels over 150   labetalol 200 MG tablet Commonly known as:  NORMODYNE Take 200 mg by mouth 2 (two) times daily.   multivitamin Tabs tablet Take 1 tablet by mouth daily.   NIFEdipine 60 MG 24 hr tablet Commonly known as:  PROCARDIA-XL/ADALAT CC Take 1 tablet (60 mg total) by mouth daily.   ondansetron 4 MG disintegrating tablet Commonly known as:  ZOFRAN-ODT Take 4 mg by mouth every 8 (eight) hours as needed for nausea or vomiting.   oxyCODONE 5 MG immediate release tablet Commonly known as:  Oxy IR/ROXICODONE Take 5 mg by mouth daily as needed for moderate pain (patient has on hand but does not take).   pantoprazole 20 MG tablet Commonly known as:  PROTONIX Take 20 mg by mouth daily.   RENVELA 800 MG  tablet Generic drug:  sevelamer carbonate Take 1,600 mg by mouth 3 (three) times daily with meals.   rOPINIRole 2 MG 24 hr tablet Commonly known as:  REQUIP XL Take 2 mg by mouth at bedtime.   SENSIPAR 60 MG tablet Generic drug:  cinacalcet Take 60 mg by mouth every evening.            Durable Medical Equipment        Start     Ordered   09/30/17 1355  For home use only DME oxygen  Once    Question Answer Comment  Mode or (Route) Nasal cannula   Liters per Minute 2   Frequency Continuous (stationary and portable oxygen unit needed)   Oxygen delivery system Gas      09/30/17 1354      Allergies  Allergen Reactions  . Reglan [Metoclopramide] Other (See Comments)    hallucinations hallucinations  . Amlodipine Besylate Rash and Other (See Comments)    dizziness    Consultations:  pulm  renal   Procedures/Studies: Dg Chest 2 View  Result Date: 09/25/2017 CLINICAL DATA:  Shortness of breath and cough for 2 days. EXAM: CHEST  2 VIEW COMPARISON:  PA and lateral chest and CT chest 07/21/2017. FINDINGS: The lungs are clear. Heart size is normal. No pneumothorax or pleural fluid. No acute bony abnormality. Surgical clips left breast are identified. IMPRESSION: No acute disease. Electronically Signed  By: Inge Rise M.D.   On: 09/25/2017 15:43   Ct Angio Chest Pe W And/or Wo Contrast  Result Date: 09/25/2017 CLINICAL DATA:  Cough and shortness of breath. Breast cancer with recent mastectomy. Recent pneumonia. PE suspected, high pretest probability. EXAM: CT ANGIOGRAPHY CHEST WITH CONTRAST TECHNIQUE: Multidetector CT imaging of the chest was performed using the standard protocol during bolus administration of intravenous contrast. Multiplanar CT image reconstructions and MIPs were obtained to evaluate the vascular anatomy. CONTRAST:  100 mL Isovue 370 COMPARISON:  CTA chest 07/21/2017 FINDINGS: Cardiovascular: The heart is enlarged. Minimal pericardial fluid is  likely physiologic. Minimal atherosclerotic changes are noted at the aortic arch without aneurysm or focal stenosis. Pulmonary artery opacification is excellent. No focal filling defects are present to suggest pulmonary embolus. Mediastinum/Nodes: Peritracheal and subcarinal adenopathy is stable. No significant hilar adenopathy is present. No significant axillary adenopathy is present. Lungs/Pleura: Patchy bilateral airspace disease is present bilaterally, right greater than left. There is diffuse ground-glass attenuation. No significant effusions are present. Upper Abdomen: The patient is status post cholecystectomy. Musculoskeletal: Vertebral body heights and alignment are normal. No focal lytic or blastic lesions are present. The ribs are unremarkable. Previous fluid collection in the left breast has been drained. Surgical drains remain in place. Review of the MIP images confirms the above findings. IMPRESSION: 1. No evidence pulmonary embolus. 2. Diffuse ground-glass attenuation likely reflecting edema and congestive heart failure. 3. Patchy bibasilar airspace disease likely reflects atelectasis. Infection is considered less likely. 4. Mediastinal adenopathy is stable, likely reactive. 5. Interval drainage of left breast fluid collection. Electronically Signed   By: San Morelle M.D.   On: 09/25/2017 18:31        Discharge Exam: Vitals:   09/30/17 1500 09/30/17 1515  BP: (!) 153/81 (!) 157/79  Pulse: 76 78  Resp:  20  Temp:  98.1 F (36.7 C)  SpO2:     Vitals:   09/30/17 1400 09/30/17 1430 09/30/17 1500 09/30/17 1515  BP: (!) 164/87 (!) 144/79 (!) 153/81 (!) 157/79  Pulse: 79 78 76 78  Resp:    20  Temp:    98.1 F (36.7 C)  TempSrc:    Oral  SpO2:      Weight:    109.1 kg (240 lb 8.4 oz)  Height:        General: Pt is alert, awake, not in acute distress Cardiovascular: RRR, S1/S2 +, no rubs, no gallops Respiratory: CTA bilaterally, no wheezing, no rhonchi Abdominal:  Soft, NT, ND, bowel sounds + Extremities: no edema, no cyanosis   The results of significant diagnostics from this hospitalization (including imaging, microbiology, ancillary and laboratory) are listed below for reference.    Significant Diagnostic Studies: Dg Chest 2 View  Result Date: 09/25/2017 CLINICAL DATA:  Shortness of breath and cough for 2 days. EXAM: CHEST  2 VIEW COMPARISON:  PA and lateral chest and CT chest 07/21/2017. FINDINGS: The lungs are clear. Heart size is normal. No pneumothorax or pleural fluid. No acute bony abnormality. Surgical clips left breast are identified. IMPRESSION: No acute disease. Electronically Signed   By: Inge Rise M.D.   On: 09/25/2017 15:43   Ct Angio Chest Pe W And/or Wo Contrast  Result Date: 09/25/2017 CLINICAL DATA:  Cough and shortness of breath. Breast cancer with recent mastectomy. Recent pneumonia. PE suspected, high pretest probability. EXAM: CT ANGIOGRAPHY CHEST WITH CONTRAST TECHNIQUE: Multidetector CT imaging of the chest was performed using the standard protocol during bolus administration  of intravenous contrast. Multiplanar CT image reconstructions and MIPs were obtained to evaluate the vascular anatomy. CONTRAST:  100 mL Isovue 370 COMPARISON:  CTA chest 07/21/2017 FINDINGS: Cardiovascular: The heart is enlarged. Minimal pericardial fluid is likely physiologic. Minimal atherosclerotic changes are noted at the aortic arch without aneurysm or focal stenosis. Pulmonary artery opacification is excellent. No focal filling defects are present to suggest pulmonary embolus. Mediastinum/Nodes: Peritracheal and subcarinal adenopathy is stable. No significant hilar adenopathy is present. No significant axillary adenopathy is present. Lungs/Pleura: Patchy bilateral airspace disease is present bilaterally, right greater than left. There is diffuse ground-glass attenuation. No significant effusions are present. Upper Abdomen: The patient is status post  cholecystectomy. Musculoskeletal: Vertebral body heights and alignment are normal. No focal lytic or blastic lesions are present. The ribs are unremarkable. Previous fluid collection in the left breast has been drained. Surgical drains remain in place. Review of the MIP images confirms the above findings. IMPRESSION: 1. No evidence pulmonary embolus. 2. Diffuse ground-glass attenuation likely reflecting edema and congestive heart failure. 3. Patchy bibasilar airspace disease likely reflects atelectasis. Infection is considered less likely. 4. Mediastinal adenopathy is stable, likely reactive. 5. Interval drainage of left breast fluid collection. Electronically Signed   By: San Morelle M.D.   On: 09/25/2017 18:31     Microbiology: Recent Results (from the past 240 hour(s))  MRSA PCR Screening     Status: None   Collection Time: 09/25/17 10:10 PM  Result Value Ref Range Status   MRSA by PCR NEGATIVE NEGATIVE Final    Comment:        The GeneXpert MRSA Assay (FDA approved for NASAL specimens only), is one component of a comprehensive MRSA colonization surveillance program. It is not intended to diagnose MRSA infection nor to guide or monitor treatment for MRSA infections.   Respiratory Panel by PCR     Status: None   Collection Time: 09/26/17  8:37 AM  Result Value Ref Range Status   Adenovirus NOT DETECTED NOT DETECTED Final   Coronavirus 229E NOT DETECTED NOT DETECTED Final   Coronavirus HKU1 NOT DETECTED NOT DETECTED Final   Coronavirus NL63 NOT DETECTED NOT DETECTED Final   Coronavirus OC43 NOT DETECTED NOT DETECTED Final   Metapneumovirus NOT DETECTED NOT DETECTED Final   Rhinovirus / Enterovirus NOT DETECTED NOT DETECTED Final   Influenza A NOT DETECTED NOT DETECTED Final   Influenza B NOT DETECTED NOT DETECTED Final   Parainfluenza Virus 1 NOT DETECTED NOT DETECTED Final   Parainfluenza Virus 2 NOT DETECTED NOT DETECTED Final   Parainfluenza Virus 3 NOT DETECTED NOT  DETECTED Final   Parainfluenza Virus 4 NOT DETECTED NOT DETECTED Final   Respiratory Syncytial Virus NOT DETECTED NOT DETECTED Final   Bordetella pertussis NOT DETECTED NOT DETECTED Final   Chlamydophila pneumoniae NOT DETECTED NOT DETECTED Final   Mycoplasma pneumoniae NOT DETECTED NOT DETECTED Final    Comment: Performed at [facility], [place] [address], [place], [place] [address]     Labs: Basic Metabolic Panel:  Recent Labs Lab 09/26/17 0302 09/27/17 0412 09/28/17 0513 09/29/17 0643 09/30/17 0834  NA 138 137 136 136 137  K 4.5 4.9 4.0 5.5* 4.5  CL 100* 99* 95* 97* 96*  CO2 25 25 27 23 24   GLUCOSE 82 115* 203* 209* 97  BUN 22* 34* 24* 37* 46*  CREATININE 6.95* 9.54* 7.46* 10.12* 13.03*  CALCIUM 8.9 8.1* 8.2* 8.0* 8.5*  PHOS  --   --   --   --  5.7*   Liver Function Tests:  Recent Labs Lab 09/26/17 0302 09/30/17 0834  AST 18  --   ALT 8*  --   ALKPHOS 79  --   BILITOT 0.4  --   PROT 7.3  --   ALBUMIN 3.7 3.9    Recent Labs Lab 09/26/17 0302  LIPASE 41   No results for input(s): AMMONIA in the last 168 hours. CBC:  Recent Labs Lab 09/25/17 1510 09/26/17 0302 09/27/17 0412 09/28/17 1118  WBC 11.4* 10.4 9.4 10.3  NEUTROABS 8.8* 6.9  --  7.8*  HGB 8.7* 8.6* 8.3* 8.7*  HCT 26.9* 27.8* 27.3* 27.8*  MCV 91.8 93.9 95.5 93.0  PLT 300 285 305 308   Cardiac Enzymes:  Recent Labs Lab 09/25/17 1514  TROPONINI 0.04*   BNP: Invalid input(s): POCBNP CBG:  Recent Labs Lab 09/29/17 1112 09/29/17 1608 09/29/17 2128 09/30/17 0714 09/30/17 1112  GLUCAP 129* 238* 129* 109* 153*    Time coordinating discharge:  Greater than 30 minutes  Signed:  Ainslee Sou, DO [facility] Pager: [phone] 09/30/2017, 4:41 PM

## 2017-09-29 NOTE — Progress Notes (Signed)
Subjective: She says she feels better. No new complaints. Her breathing is better. She's not coughing much.  Objective: Vital signs in last 24 hours: Temp:  [98.4 F (36.9 C)-99 F (37.2 C)] 98.4 F (36.9 C) (10/14 0434) Pulse Rate:  [80-88] 88 (10/14 0434) Resp:  [18-23] 18 (10/14 0434) BP: (134-141)/(65-66) 134/65 (10/14 0434) SpO2:  [97 %-99 %] 97 % (10/14 0434) Weight:  [111.8 kg (246 lb 7.6 oz)] 111.8 kg (246 lb 7.6 oz) (10/14 0434) Weight change: -2.6 kg (-5 lb 11.7 oz) Last BM Date: 09/25/17  Intake/Output from previous day: 10/13 0701 - 10/14 0700 In: 280 [P.O.:240] Out: 5 [Drains:5]  PHYSICAL EXAM General appearance: alert, cooperative and mild distress Resp: clear to auscultation bilaterally Cardio: regular rate and rhythm, S1, S2 normal, no murmur, click, rub or gallop GI: soft, non-tender; bowel sounds normal; no masses,  no organomegaly Extremities: extremities normal, atraumatic, no cyanosis or edema Skin warm and dry  Lab Results:  Results for orders placed or performed during the hospital encounter of 09/25/17 (from the past 48 hour(s))  Hepatitis B surface antigen     Status: None   Collection Time: 09/27/17 12:09 PM  Result Value Ref Range   Hepatitis B Surface Ag Negative Negative    Comment: (NOTE) Performed At: [facility] [place], [place] [id number] Lindon Romp MD [id number]   Glucose, capillary     Status: Abnormal   Collection Time: 09/27/17 12:16 PM  Result Value Ref Range   Glucose-Capillary 108 (H) 65 - 99 mg/dL   Comment 1 Notify RN    Comment 2 Document in Chart   Glucose, capillary     Status: Abnormal   Collection Time: 09/27/17  5:02 PM  Result Value Ref Range   Glucose-Capillary 150 (H) 65 - 99 mg/dL   Comment 1 Notify RN    Comment 2 Document in Chart   Glucose, capillary     Status: Abnormal   Collection Time: 09/27/17  9:54 PM  Result Value Ref Range   Glucose-Capillary 190 (H) 65 - 99 mg/dL    Comment 1 Notify RN    Comment 2 Document in Chart   Basic metabolic panel     Status: Abnormal   Collection Time: 09/28/17  5:13 AM  Result Value Ref Range   Sodium 136 135 - 145 mmol/L   Potassium 4.0 3.5 - 5.1 mmol/L    Comment: DELTA CHECK NOTED   Chloride 95 (L) 101 - 111 mmol/L   CO2 27 22 - 32 mmol/L   Glucose, Bld 203 (H) 65 - 99 mg/dL   BUN 24 (H) 6 - 20 mg/dL   Creatinine, Ser 7.46 (H) 0.44 - 1.00 mg/dL   Calcium 8.2 (L) 8.9 - 10.3 mg/dL   GFR calc non Af Amer 6 (L) >60 mL/min   GFR calc Af Amer 7 (L) >60 mL/min    Comment: (NOTE) The eGFR has been calculated using the CKD EPI equation. This calculation has not been validated in all clinical situations. eGFR's persistently <60 mL/min signify possible Chronic Kidney Disease.    Anion gap 14 5 - 15  Procalcitonin     Status: None   Collection Time: 09/28/17  5:13 AM  Result Value Ref Range   Procalcitonin 4.11 ng/mL    Comment:        Interpretation: PCT > 2 ng/mL: Systemic infection (sepsis) is likely, unless other causes are known. (NOTE)  ICU PCT Algorithm               Non ICU PCT Algorithm    ----------------------------     ------------------------------         PCT < 0.25 ng/mL                 PCT < 0.1 ng/mL     Stopping of antibiotics            Stopping of antibiotics       strongly encouraged.               strongly encouraged.    ----------------------------     ------------------------------       PCT level decrease by               PCT < 0.25 ng/mL       >= 80% from peak PCT       OR PCT 0.25 - 0.5 ng/mL          Stopping of antibiotics                                             encouraged.     Stopping of antibiotics           encouraged.    ----------------------------     ------------------------------       PCT level decrease by              PCT >= 0.25 ng/mL       < 80% from peak PCT        AND PCT >= 0.5 ng/mL            Continuing antibiotics                                                encouraged.       Continuing antibiotics            encouraged.    ----------------------------     ------------------------------     PCT level increase compared          PCT > 0.5 ng/mL         with peak PCT AND          PCT >= 0.5 ng/mL             Escalation of antibiotics                                          strongly encouraged.      Escalation of antibiotics        strongly encouraged.   Glucose, capillary     Status: Abnormal   Collection Time: 09/28/17  7:55 AM  Result Value Ref Range   Glucose-Capillary 178 (H) 65 - 99 mg/dL  Glucose, capillary     Status: Abnormal   Collection Time: 09/28/17 11:15 AM  Result Value Ref Range   Glucose-Capillary 241 (H) 65 - 99 mg/dL  IgG, IgA, IgM     Status: None   Collection Time: 09/28/17 11:18 AM  Result Value Ref Range   IgG (Immunoglobin  G), Serum 865 700 - 1,600 mg/dL   IgA 194 87 - 352 mg/dL   IgM (Immunoglobulin M), Srm 83 26 - 217 mg/dL    Comment: (NOTE) Performed At: [facility] [place], [place] [id number] Lindon Romp MD [id number]   Sedimentation rate     Status: Abnormal   Collection Time: 09/28/17 11:18 AM  Result Value Ref Range   Sed Rate 55 (H) 0 - 22 mm/hr  CBC with Differential/Platelet     Status: Abnormal   Collection Time: 09/28/17 11:18 AM  Result Value Ref Range   WBC 10.3 4.0 - 10.5 K/uL   RBC 2.99 (L) 3.87 - 5.11 MIL/uL   Hemoglobin 8.7 (L) 12.0 - 15.0 g/dL   HCT 27.8 (L) 36.0 - 46.0 %   MCV 93.0 78.0 - 100.0 fL   MCH 29.1 26.0 - 34.0 pg   MCHC 31.3 30.0 - 36.0 g/dL   RDW 14.4 11.5 - 15.5 %   Platelets 308 150 - 400 K/uL   Neutrophils Relative % 75 %   Neutro Abs 7.8 (H) 1.7 - 7.7 K/uL   Lymphocytes Relative 16 %   Lymphs Abs 1.7 0.7 - 4.0 K/uL   Monocytes Relative 3 %   Monocytes Absolute 0.3 0.1 - 1.0 K/uL   Eosinophils Relative 5 %   Eosinophils Absolute 0.6 0.0 - 0.7 K/uL   Basophils Relative 1 %   Basophils Absolute 0.1 0.0 - 0.1 K/uL   Glucose, capillary     Status: Abnormal   Collection Time: 09/28/17  4:14 PM  Result Value Ref Range   Glucose-Capillary 168 (H) 65 - 99 mg/dL   Comment 1 Notify RN    Comment 2 Document in Chart   Glucose, capillary     Status: Abnormal   Collection Time: 09/28/17 10:01 PM  Result Value Ref Range   Glucose-Capillary 254 (H) 65 - 99 mg/dL  Basic metabolic panel     Status: Abnormal   Collection Time: 09/29/17  6:43 AM  Result Value Ref Range   Sodium 136 135 - 145 mmol/L   Potassium 5.5 (H) 3.5 - 5.1 mmol/L    Comment: DELTA CHECK NOTED SPECIMEN HEMOLYZED. HEMOLYSIS MAY AFFECT INTEGRITY OF RESULTS.    Chloride 97 (L) 101 - 111 mmol/L   CO2 23 22 - 32 mmol/L   Glucose, Bld 209 (H) 65 - 99 mg/dL   BUN 37 (H) 6 - 20 mg/dL   Creatinine, Ser 10.12 (H) 0.44 - 1.00 mg/dL   Calcium 8.0 (L) 8.9 - 10.3 mg/dL   GFR calc non Af Amer 4 (L) >60 mL/min   GFR calc Af Amer 5 (L) >60 mL/min    Comment: (NOTE) The eGFR has been calculated using the CKD EPI equation. This calculation has not been validated in all clinical situations. eGFR's persistently <60 mL/min signify possible Chronic Kidney Disease.    Anion gap 16 (H) 5 - 15  Glucose, capillary     Status: Abnormal   Collection Time: 09/29/17  7:39 AM  Result Value Ref Range   Glucose-Capillary 201 (H) 65 - 99 mg/dL    ABGS No results for input(s): PHART, PO2ART, TCO2, HCO3 in the last 72 hours.  Invalid input(s): PCO2 CULTURES Recent Results (from the past 240 hour(s))  MRSA PCR Screening     Status: None   Collection Time: 09/25/17 10:10 PM  Result Value Ref Range Status   MRSA by PCR NEGATIVE NEGATIVE Final  Comment:        The GeneXpert MRSA Assay (FDA approved for NASAL specimens only), is one component of a comprehensive MRSA colonization surveillance program. It is not intended to diagnose MRSA infection nor to guide or monitor treatment for MRSA infections.   Respiratory Panel by PCR     Status: None    Collection Time: 09/26/17  8:37 AM  Result Value Ref Range Status   Adenovirus NOT DETECTED NOT DETECTED Final   Coronavirus 229E NOT DETECTED NOT DETECTED Final   Coronavirus HKU1 NOT DETECTED NOT DETECTED Final   Coronavirus NL63 NOT DETECTED NOT DETECTED Final   Coronavirus OC43 NOT DETECTED NOT DETECTED Final   Metapneumovirus NOT DETECTED NOT DETECTED Final   Rhinovirus / Enterovirus NOT DETECTED NOT DETECTED Final   Influenza A NOT DETECTED NOT DETECTED Final   Influenza B NOT DETECTED NOT DETECTED Final   Parainfluenza Virus 1 NOT DETECTED NOT DETECTED Final   Parainfluenza Virus 2 NOT DETECTED NOT DETECTED Final   Parainfluenza Virus 3 NOT DETECTED NOT DETECTED Final   Parainfluenza Virus 4 NOT DETECTED NOT DETECTED Final   Respiratory Syncytial Virus NOT DETECTED NOT DETECTED Final   Bordetella pertussis NOT DETECTED NOT DETECTED Final   Chlamydophila pneumoniae NOT DETECTED NOT DETECTED Final   Mycoplasma pneumoniae NOT DETECTED NOT DETECTED Final    Comment: Performed at [facility], [place] [address] [place], [place] [address]   Studies/Results: No results found.  Medications:  Prior to Admission:  Prescriptions Prior to Admission  Medication Sig Dispense Refill Last Dose  . acetaminophen (TYLENOL) 500 MG tablet Take 500 mg by mouth 3 (three) times daily as needed for moderate pain.   09/25/2017 at [facility]  . AMITIZA 24 MCG capsule TAKE 1 CAPSULE (24 MCG TOTAL) BY MOUTH 2 (TWO) TIMES DAILY WITH A MEAL. 60 capsule 5 09/24/2017 at Unknown time  . chlorhexidine (PERIDEX) 0.12 % solution Swish and spit 15 mLs 2 (two) times daily.   09/25/2017 at Unknown time  . clindamycin (CLEOCIN) 300 MG capsule Take 300 mg by mouth 3 (three) times daily. 10 day course starting on 09/14/2017   09/25/2017 at Unknown time  . Ferric Citrate (AURYXIA) 1 GM 210 MG(Fe) TABS Take 2 tablets by mouth 2 (two) times daily with a meal.   09/25/2017 at Unknown time  . fluticasone (FLONASE) 50 MCG/ACT  nasal spray Place 2 sprays into both nostrils daily. LEFT NARE ONLY   09/25/2017 at Unknown time  . furosemide (LASIX) 40 MG tablet Take 40-80 mg by mouth 2 (two) times daily. 66m in the morning and 486mat bedtime   09/25/2017 at Unknown time  . glipiZIDE (GLUCOTROL) 5 MG tablet Take 5 mg by mouth 2 (two) times daily as needed. For blood sugar levels over 150   09/25/2017 at Unknown time  . labetalol (NORMODYNE) 200 MG tablet Take 200 mg by mouth 2 (two) times daily.   09/25/2017 at 1230a  . multivitamin (RENA-VIT) TABS tablet Take 1 tablet by mouth daily.   09/25/2017 at Unknown time  . NIFEdipine (PROCARDIA-XL/ADALAT CC) 60 MG 24 hr tablet Take 1 tablet (60 mg total) by mouth daily. 30 tablet 2 09/24/2017 at Unknown time  . ondansetron (ZOFRAN-ODT) 4 MG disintegrating tablet Take 4 mg by mouth every 8 (eight) hours as needed for nausea or vomiting.   09/24/2017 at Unknown time  . pantoprazole (PROTONIX) 20 MG tablet Take 20 mg by mouth daily.  1 09/24/2017 at Unknown time  .  rOPINIRole (REQUIP XL) 2 MG 24 hr tablet Take 2 mg by mouth at bedtime.    09/24/2017 at Unknown time  . SENSIPAR 60 MG tablet Take 60 mg by mouth every evening.   09/24/2017 at Unknown time  . sevelamer carbonate (RENVELA) 800 MG tablet Take 1,600 mg by mouth 3 (three) times daily with meals.    09/25/2017 at Unknown time  . oxyCODONE (OXY IR/ROXICODONE) 5 MG immediate release tablet Take 5 mg by mouth daily as needed for moderate pain (patient has on hand but does not take).       Scheduled: . azithromycin  500 mg Oral Q24H  . chlorhexidine  15 mL Mouth/Throat BID  . cinacalcet  60 mg Oral QPM  . epoetin (EPOGEN/PROCRIT) injection  8,000 Units Subcutaneous Q M,W,F-HD  . ferric citrate  420 mg Oral BID WC  . fluticasone  2 spray Each Nare Daily  . insulin aspart  0-9 Units Subcutaneous TID WC  . labetalol  200 mg Oral BID  . lubiprostone  24 mcg Oral BID WC  . multivitamin  1 tablet Oral Daily  . NIFEdipine  60 mg Oral  Daily  . pantoprazole  40 mg Oral Daily  . rOPINIRole  2 mg Oral QHS  . sevelamer carbonate  1,600 mg Oral TID WC   Continuous: . sodium chloride    . sodium chloride    . piperacillin-tazobactam (ZOSYN)  IV Stopped (09/29/17 0413)   DVV:OHYWVP chloride, sodium chloride, acetaminophen, heparin, ipratropium-albuterol, lidocaine (PF), lidocaine-prilocaine, morphine injection, ondansetron **OR** ondansetron (ZOFRAN) IV, oxyCODONE, pentafluoroprop-tetrafluoroeth, promethazine  Assesment: She was admitted with abnormal chest CT with possible volume overload and healthcare associated pneumonia. Her blood tests are reassuring that she doesn't have a more serious inflammatory disease of her lung. I think she probably could be discharged safely today. Discussed with Dr. Carles Collet Principal Problem:   Volume overload Active Problems:   HCAP (healthcare-associated pneumonia)   End stage renal disease on dialysis ([place])   Diabetes mellitus ([place])   Essential hypertension   GERD (gastroesophageal reflux disease)   Acute respiratory failure with hypoxia (HCC)   Breast cancer (HCC)   Acute on chronic diastolic CHF (congestive heart failure) (HCC)   Lactic acidosis   Hypokalemia   CAP (community acquired pneumonia)   Acute respiratory failure ([place])    Plan: Okay to discharge today. I'll have her follow-up in my office. I would send her home on Zithromax as discussed.    LOS: 1 day   Kealii Thueson L 09/29/2017, 10:06 AM

## 2017-09-30 DIAGNOSIS — E875 Hyperkalemia: Secondary | ICD-10-CM

## 2017-09-30 LAB — RENAL FUNCTION PANEL
ANION GAP: 17 — AB (ref 5–15)
Albumin: 3.9 g/dL (ref 3.5–5.0)
BUN: 46 mg/dL — ABNORMAL HIGH (ref 6–20)
CHLORIDE: 96 mmol/L — AB (ref 101–111)
CO2: 24 mmol/L (ref 22–32)
Calcium: 8.5 mg/dL — ABNORMAL LOW (ref 8.9–10.3)
Creatinine, Ser: 13.03 mg/dL — ABNORMAL HIGH (ref 0.44–1.00)
GFR, EST AFRICAN AMERICAN: 3 mL/min — AB (ref >60)
GFR, EST NON AFRICAN AMERICAN: 3 mL/min — AB (ref >60)
Glucose, Bld: 97 mg/dL (ref 65–99)
PHOSPHORUS: 5.7 mg/dL — AB (ref 2.5–4.6)
POTASSIUM: 4.5 mmol/L (ref 3.5–5.1)
Sodium: 137 mmol/L (ref 135–145)

## 2017-09-30 LAB — GLUCOSE, CAPILLARY
GLUCOSE-CAPILLARY: 109 mg/dL — AB (ref 65–99)
GLUCOSE-CAPILLARY: 153 mg/dL — AB (ref 65–99)

## 2017-09-30 LAB — ANTINUCLEAR ANTIBODIES, IFA: ANA Ab, IFA: NEGATIVE

## 2017-09-30 MED ORDER — AMOXICILLIN-POT CLAVULANATE 500-125 MG PO TABS: 1.0000 | ORAL_TABLET | Freq: Every day | ORAL | Status: DC

## 2017-09-30 MED ORDER — AMOXICILLIN-POT CLAVULANATE 500-125 MG PO TABS: 1.0000 | ORAL_TABLET | Freq: Every day | ORAL | 0 refills | Status: DC

## 2017-09-30 NOTE — Care Management Note (Signed)
Case Management Note  Patient Details  Name: Patricia [name] MRN: [id number] Date of Birth: January 23, 1971  Expected Discharge Date:     10/01/2017             Expected Discharge Plan:  Home/Self Care  In-House Referral:  NA  Discharge planning Services  CM Consult  Post Acute Care Choice:  Durable Medical Equipment Choice offered to:  Patient  DME Arranged:  Oxygen DME Agency:  [facility]  Status of Service:  Completed, signed off  Additional Comments: DC home today. Meets requirements for supplemental oxygen. Pt requests [employer]. Referral faxed to [facility] Port tank will be delivered to pt room today prior to DC.   Sherald Barge, RN 09/30/2017, 4:14 PM

## 2017-09-30 NOTE — Care Management (Signed)
Patient Information   Patient Name Patricia [name], Patricia [name] ([id number]) Sex Female DOB 07-May-1971  Room Bed  A333 A333-01  Patient Demographics   Address [place] [address] Phone [phone] (Home) [phone] (Mobile) E-mail Address [email or web address]  Patient Ethnicity & Race   Ethnic Group Patient Race  Not Hispanic or Latino Black or African American  Emergency Contact(s)   Name Relation Home Work Mobile  [place] Mother [phone]    Documents on File    Status Date Received Description  Documents for the Patient  [place] Not Received    [place] E-Signature HIPAA Notice of Privacy Received 05/39/76   Driver's License Not Received    Insurance Card Received 03/09/16 [place] Directives/Living Will/HCPOA/POA Not Received    Insurance Card Not Received    Driver's License Not Received    Other Photo ID Not Received    Insurance Card Received 12/04/15 Medicare  Insurance Card Received 08/02/16 MEDICARE/GATEWAY  Release of Information Received 08/02/16 [facility]  HIM ROI Authorization  08/02/16   HIM ROI Authorization  10/26/16   HIM ROI Authorization  10/31/16 [employer]  HIM ROI Authorization  01/31/17 [employer]  AMB Provider Completed Forms  03/01/17 PRIOR AUTHORIZATION REQUEST [facility]  HIM ROI Authorization  03/05/17   Release of Information  04/09/17   Documents for the Encounter  AOB (Assignment of Insurance Benefits) Not Received    E-signature AOB Signed 09/25/17   MEDICARE RIGHTS Not Received    E-signature Medicare Rights Signed 09/25/17   ED Patient Billing Extract   ED PB Summary  Cardiac Monitoring Strip Shift Summary  09/25/17   Medicare Observation  09/27/17   Cardiac Monitoring Strip  09/28/17   EKG  09/26/17   Admission Information   Attending Provider Admitting Provider Admission Type Admission Date/Time  Tat, Shanon Brow, MD Reubin Milan, MD Emergency  09/25/17 1453  Discharge Date Hospital Service Auth/Cert Status Service Area   Internal Medicine Incomplete [place]  Unit Room/Bed Admission Status   AP-DEPT 300 A333/A333-01 Admission (Confirmed)   Admission   Complaint  shortness of breath, cancer patient  Hospital Account   Name Acct ID Class Status Primary Coverage  Patricia [name], Patricia [name] [id number] Inpatient Open MEDICARE - MEDICARE PART A AND B      Guarantor Account (for [place] [id number])   Name Relation to Pt Service Area Active? Acct Type  Patricia [name], Patricia [name] Self CHSA Yes Personal/Family  Address Phone    [place], [place] [address] [phone])        Coverage Information (for Hospital Account [id number])   1. [place] PART A AND B   F/O Payor/Plan Precert #  MEDICARE/MEDICARE PART A AND B   Subscriber Subscriber #  Patricia [name], Patricia [name] [id number]  Address Phone  [address] [place], [place] [address]   2. [place] ALLIANCE   F/O Payor/Plan Precert #  [facility] ALLIANCE   Subscriber Subscriber #  Patricia [name], Patricia [name] [id number]  Address Phone  [address] [place] [place], [place] [address] [phone]

## 2017-09-30 NOTE — Progress Notes (Signed)
Subjective: Interval History: has no complaint of nausea or vomiting. Her breathing is much weaker. She has cough and some morning but no sputum production..  Objective: Vital signs in last 24 hours: Temp:  [98.2 F (36.8 C)-98.5 F (36.9 C)] 98.4 F (36.9 C) (10/15 0700) Pulse Rate:  [73-83] 73 (10/15 0700) Resp:  [16] 16 (10/14 1500) BP: (130-159)/(65-69) 142/68 (10/15 0700) SpO2:  [97 %-100 %] 97 % (10/15 0700) Weight:  [112.3 kg (247 lb 9.2 oz)] 112.3 kg (247 lb 9.2 oz) (10/15 0500) Weight change: 0.5 kg (1 lb 1.6 oz)  Intake/Output from previous day: 10/14 0701 - 10/15 0700 In: 760 [P.O.:720] Out: 20 [Drains:20] Intake/Output this shift: No intake/output data recorded.  General appearance: alert, cooperative and no distress Resp: clear to auscultation bilaterally Cardio: regular rate and rhythm Extremities: edema no edema  Lab Results:  Recent Labs  09/28/17 1118  WBC 10.3  HGB 8.7*  HCT 27.8*  PLT 308   BMET:  Recent Labs  09/28/17 0513 09/29/17 0643  NA 136 136  K 4.0 5.5*  CL 95* 97*  CO2 27 23  GLUCOSE 203* 209*  BUN 24* 37*  CREATININE 7.46* 10.12*  CALCIUM 8.2* 8.0*   No results for input(s): PTH in the last 72 hours. Iron Studies: No results for input(s): IRON, TIBC, TRANSFERRIN, FERRITIN in the last 72 hours.  Studies/Results: No results found.  I have reviewed the patient's current medications.  Assessment/Plan: Problem #1 end-stage renal disease: Presently patient doesn't have any nausea or vomiting. Her potassium is 5.5 slightly high. Problem #2 difficulty breathing: Possibly a combination of fluid overload and pneumonia. I stated above patient seems to be feeling better. She is due for dialysis today. Problem #3 anemia: Her hemoglobin is below our target goal Problem #4 hypertension: The patient is reasonably controlled Problem #5 bone and medial disorder: Hypoxemic range Problem #6  breast CA :status post mastectomy Plan: We will  make arrangement for for patient to get dialysis today and we will remove  about 3 Lif her blood pressure tolerates. We'll use 2K/2.5 calcium bath. We'll check renal panel and CBC in the morning.     LOS: 2 days   Jalaiya Oyster S 09/30/2017,8:27 AM

## 2017-09-30 NOTE — Progress Notes (Signed)
PHARMACY NOTE:  ANTIMICROBIAL RENAL DOSAGE ADJUSTMENT  Current antimicrobial regimen includes a mismatch between antimicrobial dosage and estimated renal function.  As per policy approved by the Pharmacy & Therapeutics and Medical Executive Committees, the antimicrobial dosage will be adjusted accordingly.  Current antimicrobial dosage:  Augmentin 500mg  BID  Indication: resp infection  Renal Function:  Estimated Creatinine Clearance: 6.4 mL/min (A) (by C-G formula based on SCr of 13.03 mg/dL (H)). [x]      On intermittent HD, scheduled: []      On CRRT    Antimicrobial dosage has been changed to:  Augmentin 500mg  daily at bedtime  Additional comments:  Monitor progress  Thank you for allowing pharmacy to be a part of this patient's care.  Ena Dawley, [facility] 09/30/2017 12:32 PM

## 2017-09-30 NOTE — Progress Notes (Signed)
SATURATION QUALIFICATIONS: (This note is used to comply with regulatory documentation for home oxygen)  Patient Saturations on Room Air at Rest = 95%  Patient Saturations on Room Air while Ambulating = 88%  Patient Saturations on 2 Liters of oxygen while Ambulating = 96%

## 2017-09-30 NOTE — Procedures (Signed)
    HEMODIALYSIS TREATMENT NOTE:   4.5 hour heparin-free dialysis completed via left upper arm AVF (15g/antegrade). Goal met: 3 liters removed without interruption in ultrafiltration.  All blood was returned and hemostasis was achieved within 15 minutes. Report given to Karolee Ohs, RN.  Rockwell [place], RN, CDN

## 2017-09-30 NOTE — Progress Notes (Signed)
Pt discharged in stable condition into the care of her family via wheelchair via private vehicle.  PIV removed intact w/o S&S of complications.  Discharge instructions reviewed with pt.  Pt verbalized understanding.  Prescription given to pt. Home oxygen given to pt for ride home. Further O2 to be delivered between 630-7pm today. Pt verbalized understanding.

## 2017-09-30 NOTE — Care Management Important Message (Signed)
Important Message  Patient Details  Name: Patricia [name] MRN: [id number] Date of Birth: Oct 11, 1971   Medicare Important Message Given:  Yes    Sherald Barge, RN 09/30/2017, 4:15 PM

## 2017-09-30 NOTE — Care Management (Signed)
    Durable Medical Equipment        Start     Ordered   09/30/17 1355  For home use only DME oxygen  Once    Question Answer Comment  Mode or (Route) Nasal cannula   Liters per Minute 2   Frequency Continuous (stationary and portable oxygen unit needed)   Oxygen delivery system Gas      09/30/17 1354     Pt requires supplemental oxygen due to treatments (IV lasix) being tried and found to be insufficent.

## 2017-10-24 DIAGNOSIS — G4730 Sleep apnea, unspecified: Secondary | ICD-10-CM | POA: Diagnosis not present

## 2017-11-04 DIAGNOSIS — N186 End stage renal disease: Secondary | ICD-10-CM | POA: Diagnosis not present

## 2017-11-18 ENCOUNTER — Ambulatory Visit (HOSPITAL_COMMUNITY)
Admission: RE | Admit: 2017-11-18 | Discharge: 2017-11-18 | Disposition: A | Discharge status: Home / Self Care | Payer: PRIVATE HEALTH INSURANCE | Source: Ambulatory Visit | Attending: Pulmonary Disease | Admitting: Pulmonary Disease

## 2017-11-18 ENCOUNTER — Other Ambulatory Visit (HOSPITAL_COMMUNITY): Payer: Self-pay | Admitting: Respiratory Therapy

## 2017-11-18 ENCOUNTER — Other Ambulatory Visit (HOSPITAL_COMMUNITY): Payer: Self-pay | Admitting: Pulmonary Disease

## 2017-11-18 DIAGNOSIS — J189 Pneumonia, unspecified organism: Secondary | ICD-10-CM

## 2017-11-18 DIAGNOSIS — Z9012 Acquired absence of left breast and nipple: Secondary | ICD-10-CM | POA: Insufficient documentation

## 2017-11-18 DIAGNOSIS — R0602 Shortness of breath: Secondary | ICD-10-CM

## 2017-11-26 ENCOUNTER — Emergency Department (HOSPITAL_COMMUNITY): Payer: PRIVATE HEALTH INSURANCE

## 2017-11-26 ENCOUNTER — Inpatient Hospital Stay (HOSPITAL_COMMUNITY)
Admission: EM | Admit: 2017-11-26 | Discharge: 2017-11-28 | DRG: 193 | Disposition: A | Discharge status: Home / Self Care | Payer: PRIVATE HEALTH INSURANCE | Attending: Internal Medicine | Admitting: Internal Medicine

## 2017-11-26 ENCOUNTER — Other Ambulatory Visit: Payer: Self-pay

## 2017-11-26 ENCOUNTER — Ambulatory Visit (HOSPITAL_COMMUNITY)
Admission: RE | Admit: 2017-11-26 | Discharge: 2017-11-26 | Disposition: A | Discharge status: Home / Self Care | Payer: PRIVATE HEALTH INSURANCE | Source: Ambulatory Visit | Attending: Pulmonary Disease | Admitting: Pulmonary Disease

## 2017-11-26 ENCOUNTER — Encounter (HOSPITAL_COMMUNITY): Payer: Self-pay | Admitting: Cardiology

## 2017-11-26 DIAGNOSIS — E876 Hypokalemia: Secondary | ICD-10-CM | POA: Diagnosis present

## 2017-11-26 DIAGNOSIS — Z7951 Long term (current) use of inhaled steroids: Secondary | ICD-10-CM

## 2017-11-26 DIAGNOSIS — R0602 Shortness of breath: Secondary | ICD-10-CM

## 2017-11-26 DIAGNOSIS — E1122 Type 2 diabetes mellitus with diabetic chronic kidney disease: Secondary | ICD-10-CM | POA: Diagnosis present

## 2017-11-26 DIAGNOSIS — R0902 Hypoxemia: Secondary | ICD-10-CM | POA: Diagnosis present

## 2017-11-26 DIAGNOSIS — Z794 Long term (current) use of insulin: Secondary | ICD-10-CM | POA: Diagnosis not present

## 2017-11-26 DIAGNOSIS — R0682 Tachypnea, not elsewhere classified: Secondary | ICD-10-CM | POA: Diagnosis present

## 2017-11-26 DIAGNOSIS — I1311 Hypertensive heart and chronic kidney disease without heart failure, with stage 5 chronic kidney disease, or end stage renal disease: Secondary | ICD-10-CM | POA: Diagnosis present

## 2017-11-26 DIAGNOSIS — K219 Gastro-esophageal reflux disease without esophagitis: Secondary | ICD-10-CM | POA: Diagnosis present

## 2017-11-26 DIAGNOSIS — Z992 Dependence on renal dialysis: Secondary | ICD-10-CM

## 2017-11-26 DIAGNOSIS — N186 End stage renal disease: Secondary | ICD-10-CM | POA: Diagnosis not present

## 2017-11-26 DIAGNOSIS — J189 Pneumonia, unspecified organism: Secondary | ICD-10-CM | POA: Diagnosis not present

## 2017-11-26 DIAGNOSIS — R778 Other specified abnormalities of plasma proteins: Secondary | ICD-10-CM

## 2017-11-26 DIAGNOSIS — Z833 Family history of diabetes mellitus: Secondary | ICD-10-CM

## 2017-11-26 DIAGNOSIS — R748 Abnormal levels of other serum enzymes: Secondary | ICD-10-CM | POA: Diagnosis not present

## 2017-11-26 DIAGNOSIS — J4540 Moderate persistent asthma, uncomplicated: Secondary | ICD-10-CM | POA: Diagnosis present

## 2017-11-26 DIAGNOSIS — Z8249 Family history of ischemic heart disease and other diseases of the circulatory system: Secondary | ICD-10-CM | POA: Diagnosis not present

## 2017-11-26 DIAGNOSIS — D631 Anemia in chronic kidney disease: Secondary | ICD-10-CM | POA: Diagnosis present

## 2017-11-26 DIAGNOSIS — Z853 Personal history of malignant neoplasm of breast: Secondary | ICD-10-CM | POA: Diagnosis not present

## 2017-11-26 DIAGNOSIS — I5030 Unspecified diastolic (congestive) heart failure: Secondary | ICD-10-CM | POA: Diagnosis not present

## 2017-11-26 DIAGNOSIS — Z79899 Other long term (current) drug therapy: Secondary | ICD-10-CM | POA: Diagnosis not present

## 2017-11-26 DIAGNOSIS — G4733 Obstructive sleep apnea (adult) (pediatric): Secondary | ICD-10-CM | POA: Diagnosis present

## 2017-11-26 DIAGNOSIS — D649 Anemia, unspecified: Secondary | ICD-10-CM

## 2017-11-26 DIAGNOSIS — E0801 Diabetes mellitus due to underlying condition with hyperosmolarity with coma: Secondary | ICD-10-CM | POA: Diagnosis not present

## 2017-11-26 DIAGNOSIS — R0600 Dyspnea, unspecified: Secondary | ICD-10-CM

## 2017-11-26 DIAGNOSIS — Z8701 Personal history of pneumonia (recurrent): Secondary | ICD-10-CM | POA: Diagnosis not present

## 2017-11-26 DIAGNOSIS — Z7984 Long term (current) use of oral hypoglycemic drugs: Secondary | ICD-10-CM | POA: Diagnosis not present

## 2017-11-26 DIAGNOSIS — Z90710 Acquired absence of both cervix and uterus: Secondary | ICD-10-CM

## 2017-11-26 DIAGNOSIS — I10 Essential (primary) hypertension: Secondary | ICD-10-CM | POA: Diagnosis not present

## 2017-11-26 DIAGNOSIS — Z888 Allergy status to other drugs, medicaments and biological substances status: Secondary | ICD-10-CM | POA: Diagnosis not present

## 2017-11-26 DIAGNOSIS — Z825 Family history of asthma and other chronic lower respiratory diseases: Secondary | ICD-10-CM

## 2017-11-26 DIAGNOSIS — E119 Type 2 diabetes mellitus without complications: Secondary | ICD-10-CM

## 2017-11-26 DIAGNOSIS — R7989 Other specified abnormal findings of blood chemistry: Secondary | ICD-10-CM

## 2017-11-26 DIAGNOSIS — Z9012 Acquired absence of left breast and nipple: Secondary | ICD-10-CM

## 2017-11-26 HISTORY — DX: Obstructive sleep apnea (adult) (pediatric): G47.33

## 2017-11-26 LAB — COMPREHENSIVE METABOLIC PANEL
ALK PHOS: 117 U/L (ref 38–126)
ALT: 23 U/L (ref 14–54)
AST: 22 U/L (ref 15–41)
Albumin: 4.3 g/dL (ref 3.5–5.0)
Anion gap: 15 (ref 5–15)
BUN: 28 mg/dL — AB (ref 6–20)
CALCIUM: 9.1 mg/dL (ref 8.9–10.3)
CHLORIDE: 98 mmol/L — AB (ref 101–111)
CO2: 25 mmol/L (ref 22–32)
CREATININE: 6.9 mg/dL — AB (ref 0.44–1.00)
GFR calc Af Amer: 7 mL/min — ABNORMAL LOW (ref >60)
GFR, EST NON AFRICAN AMERICAN: 6 mL/min — AB (ref >60)
Glucose, Bld: 92 mg/dL (ref 65–99)
Potassium: 3.3 mmol/L — ABNORMAL LOW (ref 3.5–5.1)
SODIUM: 138 mmol/L (ref 135–145)
Total Bilirubin: 1.2 mg/dL (ref 0.3–1.2)
Total Protein: 8.3 g/dL — ABNORMAL HIGH (ref 6.5–8.1)

## 2017-11-26 LAB — CBC WITH DIFFERENTIAL/PLATELET
BASOS PCT: 0 %
Basophils Absolute: 0 10*3/uL (ref 0.0–0.1)
EOS ABS: 0.2 10*3/uL (ref 0.0–0.7)
EOS PCT: 2 %
HCT: 31.9 % — ABNORMAL LOW (ref 36.0–46.0)
Hemoglobin: 10.1 g/dL — ABNORMAL LOW (ref 12.0–15.0)
LYMPHS ABS: 1.9 10*3/uL (ref 0.7–4.0)
Lymphocytes Relative: 17 %
MCH: 28.4 pg (ref 26.0–34.0)
MCHC: 31.7 g/dL (ref 30.0–36.0)
MCV: 89.6 fL (ref 78.0–100.0)
MONOS PCT: 2 %
Monocytes Absolute: 0.2 10*3/uL (ref 0.1–1.0)
Neutro Abs: 8.6 10*3/uL — ABNORMAL HIGH (ref 1.7–7.7)
Neutrophils Relative %: 79 %
PLATELETS: 288 10*3/uL (ref 150–400)
RBC: 3.56 MIL/uL — ABNORMAL LOW (ref 3.87–5.11)
RDW: 15.1 % (ref 11.5–15.5)
WBC: 10.9 10*3/uL — ABNORMAL HIGH (ref 4.0–10.5)

## 2017-11-26 LAB — PULMONARY FUNCTION TEST
FEF 25-75 Pre: 1.17 L/sec
FEF2575-%PRED-PRE: 47 %
FEV1-%Pred-Pre: 49 %
FEV1-PRE: 1.13 L
FEV1FVC-%Pred-Pre: 105 %
FEV6-%PRED-PRE: 47 %
FEV6-PRE: 1.31 L
FEV6FVC-%Pred-Pre: 103 %
FVC-%PRED-PRE: 46 %
FVC-PRE: 1.31 L
PRE FEV6/FVC RATIO: 100 %
Pre FEV1/FVC ratio: 86 %

## 2017-11-26 LAB — GLUCOSE, CAPILLARY
GLUCOSE-CAPILLARY: 213 mg/dL — AB (ref 65–99)
Glucose-Capillary: 101 mg/dL — ABNORMAL HIGH (ref 65–99)

## 2017-11-26 LAB — CBG MONITORING, ED: Glucose-Capillary: 113 mg/dL — ABNORMAL HIGH (ref 65–99)

## 2017-11-26 LAB — BRAIN NATRIURETIC PEPTIDE: B Natriuretic Peptide: 270 pg/mL — ABNORMAL HIGH (ref 0.0–100.0)

## 2017-11-26 LAB — TROPONIN I
TROPONIN I: 0.05 ng/mL — AB (ref <0.03)
Troponin I: 0.05 ng/mL (ref <0.03)
Troponin I: 0.05 ng/mL (ref <0.03)

## 2017-11-26 MED ORDER — ACETAMINOPHEN 325 MG PO TABS
650.0000 mg | ORAL_TABLET | Freq: Once | ORAL | Status: AC
  Administered 2017-11-26: 650 mg via ORAL
  Filled 2017-11-26: qty 2

## 2017-11-26 MED ORDER — NIFEDIPINE ER OSMOTIC RELEASE 30 MG PO TB24
120.0000 mg | ORAL_TABLET | Freq: Every day | ORAL | Status: DC
  Administered 2017-11-26 – 2017-11-28 (×3): 120 mg via ORAL
  Filled 2017-11-26 (×3): qty 4

## 2017-11-26 MED ORDER — SODIUM CHLORIDE 0.9 % IV SOLN: 250.0000 mL | INTRAVENOUS | Status: DC | PRN

## 2017-11-26 MED ORDER — FLUTICASONE PROPIONATE 50 MCG/ACT NA SUSP
2.0000 | Freq: Every day | NASAL | Status: DC
  Administered 2017-11-27 – 2017-11-28 (×2): 2 via NASAL
  Filled 2017-11-26: qty 16

## 2017-11-26 MED ORDER — ROPINIROLE HCL ER 2 MG PO TB24
2.0000 mg | ORAL_TABLET | Freq: Every day | ORAL | Status: DC
  Filled 2017-11-26: qty 1

## 2017-11-26 MED ORDER — DEXTROSE 5 % IV SOLN
500.0000 mg | INTRAVENOUS | Status: DC
  Administered 2017-11-27: 500 mg via INTRAVENOUS
  Filled 2017-11-26 (×2): qty 500

## 2017-11-26 MED ORDER — FERRIC CITRATE 1 GM 210 MG(FE) PO TABS
420.0000 mg | ORAL_TABLET | Freq: Two times a day (BID) | ORAL | Status: DC
  Administered 2017-11-27 – 2017-11-28 (×3): 420 mg via ORAL
  Filled 2017-11-26 (×5): qty 2

## 2017-11-26 MED ORDER — CINACALCET HCL 30 MG PO TABS
60.0000 mg | ORAL_TABLET | Freq: Every evening | ORAL | Status: DC
  Administered 2017-11-26 – 2017-11-28 (×3): 60 mg via ORAL
  Filled 2017-11-26 (×3): qty 2

## 2017-11-26 MED ORDER — FUROSEMIDE 40 MG PO TABS
40.0000 mg | ORAL_TABLET | Freq: Every day | ORAL | Status: DC
Start: 2017-11-26 — End: 2017-11-28
  Administered 2017-11-26 – 2017-11-27 (×2): 40 mg via ORAL
  Filled 2017-11-26 (×2): qty 1

## 2017-11-26 MED ORDER — PANTOPRAZOLE SODIUM 40 MG PO TBEC
40.0000 mg | DELAYED_RELEASE_TABLET | Freq: Every day | ORAL | Status: DC
  Administered 2017-11-26 – 2017-11-28 (×3): 40 mg via ORAL
  Filled 2017-11-26 (×3): qty 1

## 2017-11-26 MED ORDER — SEVELAMER CARBONATE 800 MG PO TABS
1600.0000 mg | ORAL_TABLET | Freq: Three times a day (TID) | ORAL | Status: DC
  Administered 2017-11-26 – 2017-11-28 (×6): 1600 mg via ORAL
  Filled 2017-11-26 (×7): qty 2

## 2017-11-26 MED ORDER — POTASSIUM CHLORIDE CRYS ER 10 MEQ PO TBCR
10.0000 meq | EXTENDED_RELEASE_TABLET | Freq: Once | ORAL | Status: AC
  Administered 2017-11-26: 10 meq via ORAL
  Filled 2017-11-26: qty 1

## 2017-11-26 MED ORDER — HYDRALAZINE HCL 20 MG/ML IJ SOLN: 10.0000 mg | Freq: Four times a day (QID) | INTRAMUSCULAR | Status: DC | PRN

## 2017-11-26 MED ORDER — IPRATROPIUM-ALBUTEROL 0.5-2.5 (3) MG/3ML IN SOLN
3.0000 mL | RESPIRATORY_TRACT | Status: DC
  Administered 2017-11-26 – 2017-11-27 (×5): 3 mL via RESPIRATORY_TRACT
  Filled 2017-11-26 (×5): qty 3

## 2017-11-26 MED ORDER — DEXTROSE 5 % IV SOLN
1.0000 g | INTRAVENOUS | Status: DC
  Administered 2017-11-27 – 2017-11-28 (×2): 1 g via INTRAVENOUS
  Filled 2017-11-26 (×2): qty 10

## 2017-11-26 MED ORDER — DEXTROSE 5 % IV SOLN
1.0000 g | Freq: Once | INTRAVENOUS | Status: AC
  Administered 2017-11-26: 1 g via INTRAVENOUS
  Filled 2017-11-26: qty 10

## 2017-11-26 MED ORDER — GUAIFENESIN ER 600 MG PO TB12
1200.0000 mg | ORAL_TABLET | Freq: Two times a day (BID) | ORAL | Status: DC
  Administered 2017-11-26 – 2017-11-28 (×4): 1200 mg via ORAL
  Filled 2017-11-26 (×4): qty 2

## 2017-11-26 MED ORDER — SODIUM CHLORIDE 0.9% FLUSH: 3.0000 mL | INTRAVENOUS | Status: DC | PRN

## 2017-11-26 MED ORDER — AZITHROMYCIN 500 MG IV SOLR
500.0000 mg | Freq: Once | INTRAVENOUS | Status: AC
Start: 2017-11-26 — End: 2017-11-26
  Administered 2017-11-26: 500 mg via INTRAVENOUS
  Filled 2017-11-26: qty 500

## 2017-11-26 MED ORDER — FUROSEMIDE 80 MG PO TABS
80.0000 mg | ORAL_TABLET | Freq: Every day | ORAL | Status: DC
  Administered 2017-11-27 – 2017-11-28 (×2): 80 mg via ORAL
  Filled 2017-11-26: qty 1

## 2017-11-26 MED ORDER — RENA-VITE PO TABS
1.0000 | ORAL_TABLET | Freq: Every day | ORAL | Status: DC
  Administered 2017-11-26 – 2017-11-28 (×3): 1 via ORAL
  Filled 2017-11-26 (×3): qty 1

## 2017-11-26 MED ORDER — HEPARIN SODIUM (PORCINE) 5000 UNIT/ML IJ SOLN
5000.0000 [IU] | Freq: Three times a day (TID) | INTRAMUSCULAR | Status: DC
  Administered 2017-11-26 – 2017-11-28 (×6): 5000 [IU] via SUBCUTANEOUS
  Filled 2017-11-26 (×6): qty 1

## 2017-11-26 MED ORDER — INSULIN ASPART 100 UNIT/ML ~~LOC~~ SOLN
0.0000 [IU] | Freq: Three times a day (TID) | SUBCUTANEOUS | Status: DC
  Administered 2017-11-27 (×2): 2 [IU] via SUBCUTANEOUS
  Administered 2017-11-27: 1 [IU] via SUBCUTANEOUS
  Administered 2017-11-28: 7 [IU] via SUBCUTANEOUS
  Administered 2017-11-28: 2 [IU] via SUBCUTANEOUS
  Administered 2017-11-28: 3 [IU] via SUBCUTANEOUS

## 2017-11-26 MED ORDER — LABETALOL HCL 200 MG PO TABS
200.0000 mg | ORAL_TABLET | Freq: Two times a day (BID) | ORAL | Status: DC
  Administered 2017-11-26 – 2017-11-28 (×3): 200 mg via ORAL
  Filled 2017-11-26 (×3): qty 1

## 2017-11-26 MED ORDER — INSULIN ASPART 100 UNIT/ML ~~LOC~~ SOLN
0.0000 [IU] | Freq: Every day | SUBCUTANEOUS | Status: DC
Start: 2017-11-26 — End: 2017-11-28
  Administered 2017-11-26: 2 [IU] via SUBCUTANEOUS

## 2017-11-26 MED ORDER — SODIUM CHLORIDE 0.9% FLUSH
3.0000 mL | Freq: Two times a day (BID) | INTRAVENOUS | Status: DC
Start: 2017-11-26 — End: 2017-11-28
  Administered 2017-11-26 – 2017-11-28 (×4): 3 mL via INTRAVENOUS

## 2017-11-26 MED ORDER — LUBIPROSTONE 24 MCG PO CAPS
24.0000 ug | ORAL_CAPSULE | Freq: Two times a day (BID) | ORAL | Status: DC
  Administered 2017-11-26 – 2017-11-28 (×4): 24 ug via ORAL
  Filled 2017-11-26 (×5): qty 1

## 2017-11-26 NOTE — ED Triage Notes (Signed)
Pt in dialysis today and had to stop due to sob. Pt  Had OP PFT test today and had onset of left side chest cramping after coughing.  Pt room air sats were 84%.  Pt had episode during the PFT where she was gazing to the right,  Had dizziness and a headache. Marland Kitchen

## 2017-11-26 NOTE — H&P (Signed)
TRH H&P   Patient Demographics:    Patricia [name], is a 46 y.o. female  MRN: [id number]   DOB - 1971/05/21  Admit Date - 11/26/2017  Outpatient Primary MD for the patient is Jake Samples, PA-C  Referring MD/NP/PA: Merrily Pew  Outpatient Specialists:  Sinda Du (pulmonary)  Patient coming from: home  Chief Complaint  Patient presents with  . Shortness of Breath      HPI:    Patricia [name]  is a 46 y.o. female, w OSA not on Cpap, Dm2 hypertension,ESRD on HD (M, W, D, ) apparently c/o  dyspnea for the past 1 month,  Over the past 1 week her dyspnea has been worse . Pt sttarted to have cough w green sputum 5-7 days ago.  Denies fever, chills, cp, palp, n/v, diarrhea, brbpr, black stool.  Pt presents due to dyspnea.   In Ed,  CT chest IMPRESSION: 1. Mosaic appearance throughout the lungs suggests small airways disease. 2. Also there are somewhat prominent interstitial markings particularly at the lung bases most consistent with interstitial edema. 3. Opacities posteriorly in both lung bases do not appear typical of atelectasis and may represent developing pneumonia. Consider followup. 4. Fluid in the mid left mastectomy bed, seroma or hematoma. Correlate clinically. 5. Prominent thyroid gland.  Na 138, K 3.3  Glucose 92,  Bun 28, Creatinine 6.90 Ast 22, Alt 23 Wbc 10.9, Hgb 10.1, Plt 288 Trop 0.05  BNP270  Pt will be admitted for dyspnea likely multifactorial,  But primary due to CAP.     Review of systems:    In addition to the HPI above,  Pt denies weight gain, or edema, does note orthopnea.  No Fever-chills, No Headache, No changes with Vision or hearing, No problems swallowing food or Liquids, No Chest pain, No Abdominal pain, No Nausea or Vommitting, Bowel movements are regular, No Blood in stool or Urine, No dysuria, No new skin rashes or  bruises, No new joints pains-aches,  No new weakness, tingling, numbness in any extremity, No recent weight gain or loss, No polyuria, polydypsia or polyphagia, No significant Mental Stressors.  A full 10 point Review of Systems was done, except as stated above, all other Review of Systems were negative.   With Past History of the following :    Past Medical History:  Diagnosis Date  . Blood transfusion without reported diagnosis   . Cancer ([place])   . Diabetes mellitus without complication ([place])   . Dialysis patient ([place])    mon, wed, friday,   . Hypertension   . OSA (obstructive sleep apnea)   . Renal disorder   . Renal insufficiency       Past Surgical History:  Procedure Laterality Date  . ABDOMINAL HYSTERECTOMY    . AV FISTULA PLACEMENT  11/2014   at [place] N/A 07/10/2016  Procedure: BALLOON DILATION;  Surgeon: Danie Binder, MD;  Location: [facility];  Service: Endoscopy;  Laterality: N/A;  Pyloric dilation  . BREAST LUMPECTOMY    . CESAREAN SECTION    . CHOLECYSTECTOMY    . COLONOSCOPY WITH PROPOFOL N/A 09/27/2016   Procedure: COLONOSCOPY WITH PROPOFOL;  Surgeon: Daneil Dolin, MD;  Location: [facility];  Service: Endoscopy;  Laterality: N/A;  2:00  . DILATION AND CURETTAGE OF UTERUS    . ESOPHAGOGASTRODUODENOSCOPY N/A 07/10/2016   Dr.Fields- normal esophagus, gastric stenosis was found at the pylorus, gastritis on bx, normal examined duodenun  . MASTECTOMY     left sided      Social History:     Social History   Tobacco Use  . Smoking status: Never Smoker  . Smokeless tobacco: Never Used  Substance Use Topics  . Alcohol use: No    Alcohol/week: 0.0 oz     Lives - at home  Mobility - walks by self   Family History :     Family History  Problem Relation Age of Onset  . Diabetes Mellitus II Mother   . Hypertension Mother   . Hypertension Sister   . Hypertension Sister   . Colon cancer Neg Hx       Home  Medications:   Prior to Admission medications   Medication Sig Start Date End Date Taking? Authorizing Provider  acetaminophen (TYLENOL) 500 MG tablet Take 500 mg by mouth 3 (three) times daily as needed for moderate pain.    [provider]  AMITIZA 24 MCG capsule TAKE 1 CAPSULE (24 MCG TOTAL) BY MOUTH 2 (TWO) TIMES DAILY WITH A MEAL. 05/14/17   Carlis Stable, NP  amoxicillin-clavulanate (AUGMENTIN) 500-125 MG tablet Take 1 tablet (500 mg total) by mouth at bedtime. 09/30/17   Orson Eva, MD  azithromycin (ZITHROMAX) 500 MG tablet Take 1 tablet (500 mg total) by mouth daily. 09/29/17   Orson Eva, MD  chlorhexidine (PERIDEX) 0.12 % solution Swish and spit 15 mLs 2 (two) times daily.    [provider]  Ferric Citrate (AURYXIA) 1 GM 210 MG(Fe) TABS Take 2 tablets by mouth 2 (two) times daily with a meal.    [provider]  fluticasone (FLONASE) 50 MCG/ACT nasal spray Place 2 sprays into both nostrils daily. LEFT NARE ONLY 08/08/17   [provider]  furosemide (LASIX) 40 MG tablet Take 40-80 mg by mouth 2 (two) times daily. 80mg  in the morning and 40mg  at bedtime    [provider]  glipiZIDE (GLUCOTROL) 5 MG tablet Take 5 mg by mouth 2 (two) times daily as needed. For blood sugar levels over 150    [provider]  labetalol (NORMODYNE) 200 MG tablet Take 200 mg by mouth 2 (two) times daily.    [provider]  multivitamin (RENA-VIT) TABS tablet Take 1 tablet by mouth daily.    [provider]  NIFEdipine (PROCARDIA-XL/ADALAT CC) 60 MG 24 hr tablet Take 1 tablet (60 mg total) by mouth daily. 12/06/15   Isaac Bliss, Rayford Halsted, MD  ondansetron (ZOFRAN-ODT) 4 MG disintegrating tablet Take 4 mg by mouth every 8 (eight) hours as needed for nausea or vomiting.    [provider]  oxyCODONE (OXY IR/ROXICODONE) 5 MG immediate release tablet Take 5 mg by mouth daily as needed for moderate pain (patient has on hand but does  not take).  09/14/17   [provider]  pantoprazole (PROTONIX) 20 MG  tablet Take 20 mg by mouth daily. 06/28/17   [provider]  rOPINIRole (REQUIP XL) 2 MG 24 hr tablet Take 2 mg by mouth at bedtime.  09/10/16   [provider]  SENSIPAR 60 MG tablet Take 60 mg by mouth every evening. 09/19/17   [provider]  sevelamer carbonate (RENVELA) 800 MG tablet Take 1,600 mg by mouth 3 (three) times daily with meals.     [provider]     Allergies:     Allergies  Allergen Reactions  . Reglan [Metoclopramide] Other (See Comments)    hallucinations hallucinations  . Amlodipine Besylate Rash and Other (See Comments)    dizziness     Physical Exam:   Vitals  Blood pressure (!) 142/87, pulse 87, temperature 98 F (36.7 C), temperature source Oral, resp. rate 19, height 5\' 3"  (1.6 m), weight 111.1 kg (245 lb), SpO2 94 %.   1. General  lying in bed in NAD,    2. Normal affect and insight, Not Suicidal or Homicidal, Awake Alert, Oriented X 3.  3. No F.N deficits, ALL C.Nerves Intact, Strength 5/5 all 4 extremities, Sensation intact all 4 extremities, Plantars down going.  4. Ears and Eyes appear Normal, Conjunctivae clear, PERRLA. Moist Oral Mucosa.  5. Supple Neck, No JVD, No cervical lymphadenopathy appriciated, No Carotid Bruits.  6. Symmetrical Chest wall movement, Good air movement bilaterally, CTAB.  7. RRR, No Gallops, Rubs or Murmurs, No Parasternal Heave.  8. Positive Bowel Sounds, Abdomen Soft, No tenderness, No organomegaly appriciated,No rebound -guarding or rigidity.  9.  No Cyanosis, Normal Skin Turgor, No Skin Rash or Bruise.  10. Good muscle tone,  joints appear normal , no effusions, Normal ROM.  11. No Palpable Lymph Nodes in Neck or Axillae     Data Review:    CBC Recent Labs  Lab 11/26/17 1217  WBC 10.9*  HGB 10.1*  HCT 31.9*  PLT 288  MCV 89.6  MCH 28.4  MCHC 31.7  RDW 15.1  LYMPHSABS 1.9  MONOABS  0.2  EOSABS 0.2  BASOSABS 0.0   ------------------------------------------------------------------------------------------------------------------  Chemistries  Recent Labs  Lab 11/26/17 1217  NA 138  K 3.3*  CL 98*  CO2 25  GLUCOSE 92  BUN 28*  CREATININE 6.90*  CALCIUM 9.1  AST 22  ALT 23  ALKPHOS 117  BILITOT 1.2   ------------------------------------------------------------------------------------------------------------------ estimated creatinine clearance is 11.9 mL/min (A) (by C-G formula based on SCr of 6.9 mg/dL (H)). ------------------------------------------------------------------------------------------------------------------ No results for input(s): TSH, T4TOTAL, T3FREE, THYROIDAB in the last 72 hours.  Invalid input(s): FREET3  Coagulation profile No results for input(s): INR, PROTIME in the last 168 hours. ------------------------------------------------------------------------------------------------------------------- No results for input(s): DDIMER in the last 72 hours. -------------------------------------------------------------------------------------------------------------------  Cardiac Enzymes Recent Labs  Lab 11/26/17 1217  TROPONINI 0.05*   ------------------------------------------------------------------------------------------------------------------    Component Value Date/Time   BNP 270.0 (H) 11/26/2017 1217     ---------------------------------------------------------------------------------------------------------------  Urinalysis    Component Value Date/Time   COLORURINE YELLOW 09/14/2016 1915   APPEARANCEUR CLEAR 09/14/2016 1915   LABSPEC 1.015 09/14/2016 1915   PHURINE 7.5 09/14/2016 1915   GLUCOSEU >1000 (A) 09/14/2016 1915   HGBUR TRACE (A) 09/14/2016 1915   BILIRUBINUR NEGATIVE 09/14/2016 [place] NEGATIVE 09/14/2016 1915   PROTEINUR >300 (A) 09/14/2016 1915   NITRITE NEGATIVE 09/14/2016 1915    LEUKOCYTESUR NEGATIVE 09/14/2016 1915    ----------------------------------------------------------------------------------------------------------------   Imaging Results:    Dg Chest 2 View  Result Date:  11/26/2017 CLINICAL DATA:  Shortness of breath.  Chronic renal failure EXAM: CHEST  2 VIEW COMPARISON:  November 18, 2017 FINDINGS: There is mild interstitial pulmonary edema, slightly more on the left than on the right. There is a minimal left pleural effusion. There is no airspace consolidation. There is cardiomegaly with mild pulmonary venous hypertension. No adenopathy. No bone lesions. There are surgical clips overlying the left breast. IMPRESSION: Pulmonary vascular congestion with mild interstitial edema. No consolidation. Minimal left pleural effusion. Electronically Signed   By: Lowella Grip III M.D.   On: 11/26/2017 13:06   Ct Chest Wo Contrast  Result Date: 11/26/2017 CLINICAL DATA:  Shortness of breath developed during today's dialysis, coughing EXAM: CT CHEST WITHOUT CONTRAST TECHNIQUE: Multidetector CT imaging of the chest was performed following the standard protocol without IV contrast. COMPARISON:  Chest x-ray of 11/26/2017 FINDINGS: Cardiovascular: The heart is mildly enlarged. No pericardial effusion is seen. No significant coronary artery calcifications are evident. The mid ascending thoracic aorta measures 30 mm in diameter pre Mediastinum/Nodes: On this unenhanced study there are slightly prominent precarinal nodes with the largest measuring 14 mm in diameter most likely inflammatory or infectious in etiology. A prominent thyroid gland is noted. Lungs/Pleura: On lung window images, there is mosaic appearance of the lungs which may indicate small airway disease. Also there are slightly prominent interstitial markings particular at the lung bases which may indicate mild interstitial edema. Also the lung bases there are opacities posteriorly and inferiorly which do not have  the typical appearance of atelectasis and may represent areas of developing pneumonia. The central airway is patent. Upper Abdomen: What is seen of the upper abdomen is unremarkable with the kidneys being somewhat small. Musculoskeletal: The thoracic vertebrae are normal alignment with degenerative change in the mid to lower thoracic spine. No compression deformity is seen. 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 There is fluid in the left mastectomy bed which may represent residual hematoma or seroma and clinical correlation is recommended. IMPRESSION: 1. Mosaic appearance throughout the lungs suggests small airways disease. 2. Also there are somewhat prominent interstitial markings particularly at the lung bases most consistent with interstitial edema. 3. Opacities posteriorly in both lung bases do not appear typical of atelectasis and may represent developing pneumonia. Consider followup. 4. Fluid in the mid left mastectomy bed, seroma or hematoma. Correlate clinically. 5. Prominent thyroid gland. Electronically Signed   By: Ivar Drape M.D.   On: 11/26/2017 14:21   nsr at 57, nl axis, nl pr int, slightly prolonged qtc, LVH   Assessment & Plan:    Principal Problem:   Dyspnea Active Problems:   End stage renal disease on dialysis ([place])   Diabetes mellitus ([facility])   Essential hypertension   Hypokalemia   CAP (community acquired pneumonia)   Anemia   Elevated troponin    Dyspnea secondary to CAP CAP Blood culture x2 Sputum gram stain and culture Urine legionella antigen, urine strep antigen resp virus panel Rocephin 1gm iv qday, Zithromax 500mg  iv qday Pulmonary consult for Dr. Luan Pulling   Hypokalemia Replete Check cmp in am  Trop elevation likely due to ESRD Tele Trop I q6h x3 Check echo  Dm2 Cont glipizide fsbs ac and qhs,  ISS  ESRD on HD (M, W, F, ) Last dialysis today Cont sensipar, cont renvela Nephrology consult placed in computer for arranging  dialysis  Hypertension Cont labetalol Cont nifedipine Cont furosemide  Gerd Cont protonix  Anemia Check cbc in am  DVT Prophylaxis Heparin -  SCDs AM Labs Ordered, also please review Full Orders  Family Communication: Admission, patients condition and plan of care including tests being ordered have been discussed with the patient who indicate understanding and agree with the plan and Code Status.  Code Status FULL CODE  Likely DC to home  Condition GUARDED    Consults called: nephrology, pulmonary  Admission status: inpatient  Time spent in minutes : 45   Jani Gravel M.D on 11/26/2017 at 3:32 PM  Between 7am to 7pm - Pager - [phone]   After 7pm go to [email or web address] - password [email or web address]  [facility] - Office  [phone]

## 2017-11-26 NOTE — Consult Note (Signed)
Patient was seen in emergency room on 11/26/2017 because of difficulty in breathing and hypoxemia when she was on dialysis.  After patient was seen and evaluated and consult was done .  Since the consult was not complete is done on 11/28/2017.  Please refer to that note.

## 2017-11-26 NOTE — ED Notes (Signed)
CRITICAL VALUE ALERT  Critical Value:  Troponin 0.05  Date & Time Notied:  11/26/17 1311  Provider Notified: Notified MD   Orders Received/Actions taken: Notified MD

## 2017-11-26 NOTE — ED Provider Notes (Signed)
Emergency Department Provider Note   I have reviewed the triage vital signs and the nursing notes.   HISTORY  Chief Complaint Shortness of Breath   HPI Patricia [name] is a 46 y.o. female history of diabetes, hypertension and end-stage renal disease on dialysis Monday Wednesday Friday presents to the emergency department today with shortness of breath.  Patient states she has had worsening shortness of breath the last few days and significant cough.  She will have some left sided muscular back pain and lateral chest pain after coughing that seems to resolve on its own.  States his she has seen Dr. Luan Pulling needed pulmonary function test today she is unsure what the results were.  She does have a family history of asthma and COPD so the thinking it might be because of that.  She has not had any fevers recently but does state that her symptoms are worse with walking and that has gotten significantly worsened as well.  Did not try anything for symptoms.  She was hypoxic at dialysis but still had a treatment and her symptoms did not improve her that so was sent here for further evaluation.  No other associated or modifying symptoms.   Past Medical History:  Diagnosis Date  . Blood transfusion without reported diagnosis   . Cancer ([place])   . Diabetes mellitus without complication ([place])   . Dialysis patient ([place])    mon, wed, friday,   . Hypertension   . Renal disorder   . Renal insufficiency     Patient Active Problem List   Diagnosis Date Noted  . Dyspnea 11/26/2017  . Hyperkalemia 09/29/2017  . Acute respiratory failure ([place]) 09/28/2017  . CAP (community acquired pneumonia) 09/26/2017  . Volume overload 09/25/2017  . Acute on chronic diastolic CHF (congestive heart failure) ([place]) 09/25/2017  . Lactic acidosis 09/25/2017  . Hypokalemia 09/25/2017  . Breast cancer ([place]) 07/22/2017  . SOB (shortness of breath)   . Acute respiratory failure with hypoxia ([place]) 07/21/2017  . Flatulence  10/25/2016  . GERD (gastroesophageal reflux disease) 08/02/2016  . Pyloric stenosis, acquired   . Nausea with vomiting   . Generalized abdominal pain   . Chest pain, rule out acute myocardial infarction 07/04/2016  . Constipation 07/04/2016  . Nausea 07/04/2016  . Essential hypertension 07/04/2016  . HCAP (healthcare-associated pneumonia) 12/04/2015  . End stage renal disease on dialysis ([place]) 12/04/2015  . Diabetes mellitus ([place]) 12/04/2015    Past Surgical History:  Procedure Laterality Date  . ABDOMINAL HYSTERECTOMY    . AV FISTULA PLACEMENT  11/2014   at [facility] N/A 07/10/2016   Procedure: BALLOON DILATION;  Surgeon: Danie Binder, MD;  Location: [facility];  Service: Endoscopy;  Laterality: N/A;  Pyloric dilation  . BREAST LUMPECTOMY    . CESAREAN SECTION    . CHOLECYSTECTOMY    . COLONOSCOPY WITH PROPOFOL N/A 09/27/2016   Procedure: COLONOSCOPY WITH PROPOFOL;  Surgeon: Daneil Dolin, MD;  Location: [facility];  Service: Endoscopy;  Laterality: N/A;  2:00  . DILATION AND CURETTAGE OF UTERUS    . ESOPHAGOGASTRODUODENOSCOPY N/A 07/10/2016   Dr.Fields- normal esophagus, gastric stenosis was found at the pylorus, gastritis on bx, normal examined duodenun  . MASTECTOMY     left sided    Current Outpatient Rx  . Order #: [id number] Class: Historical Med  . Order #: [id number] Class: Normal  . Order #: [id number] Class: Normal  . Order #: [id number] Class: Print  .  Order #: [id number] Class: Historical Med  . Order #: [id number] Class: Historical Med  . Order #: [id number] Class: Historical Med  . Order #: [id number] Class: Historical Med  . Order #: [id number] Class: Historical Med  . Order #: [id number] Class: Historical Med  . Order #: [id number] Class: Historical Med  . Order #: [id number] Class: Normal  . Order #: [id number] Class: Historical Med  . Order #: [id number] Class: Historical Med  . Order #: [id number] Class: Historical Med  . Order #: [id number] Class:  Historical Med  . Order #: [id number] Class: Historical Med  . Order #: [id number] Class: Historical Med    Allergies Reglan [metoclopramide] and Amlodipine besylate  Family History  Problem Relation Age of Onset  . Diabetes Mellitus II Mother   . Hypertension Mother   . Hypertension Sister   . Hypertension Sister   . Colon cancer Neg Hx     Social History Social History   Tobacco Use  . Smoking status: Never Smoker  . Smokeless tobacco: Never Used  Substance Use Topics  . Alcohol use: No    Alcohol/week: 0.0 oz  . Drug use: No    Review of Systems  All other systems negative except as documented in the HPI. All pertinent positives and negatives as reviewed in the HPI. ____________________________________________   PHYSICAL EXAM:  VITAL SIGNS: ED Triage Vitals  Enc Vitals Group     BP 11/26/17 1155 (!) 142/87     Pulse Rate 11/26/17 1155 88     Resp 11/26/17 1155 20     Temp 11/26/17 1155 98 F (36.7 C)     Temp Source 11/26/17 1155 Oral     SpO2 11/26/17 1155 90 %     Weight 11/26/17 1154 245 lb (111.1 kg)     Height 11/26/17 1154 5\' 3"  (1.6 m)    Constitutional: Alert and oriented. Well appearing and in no acute distress. Eyes: Conjunctivae are normal. PERRL. EOMI. Head: Atraumatic. Nose: No congestion/rhinnorhea. Mouth/Throat: Mucous membranes are moist.  Oropharynx non-erythematous. Neck: No stridor.  No meningeal signs.   Cardiovascular: Normal rate, regular rhythm. Good peripheral circulation. Grossly normal heart sounds.   Respiratory: tachypneic respiratory effort.  No retractions. Lungs diminished. Gastrointestinal: Soft and nontender. No distention.  Musculoskeletal: No lower extremity tenderness nor edema. No gross deformities of extremities. Neurologic:  Normal speech and language. No gross focal neurologic deficits are appreciated.  Skin:  Skin is warm, dry and intact. No rash noted.   ____________________________________________    LABS (all labs ordered are listed, but only abnormal results are displayed)  Labs Reviewed  COMPREHENSIVE METABOLIC PANEL - Abnormal; Notable for the following components:      Result Value   Potassium 3.3 (*)    Chloride 98 (*)    BUN 28 (*)    Creatinine, Ser 6.90 (*)    Total Protein 8.3 (*)    GFR calc non Af Amer 6 (*)    GFR calc Af Amer 7 (*)    All other components within normal limits  CBC WITH DIFFERENTIAL/PLATELET - Abnormal; Notable for the following components:   WBC 10.9 (*)    RBC 3.56 (*)    Hemoglobin 10.1 (*)    HCT 31.9 (*)    Neutro Abs 8.6 (*)    All other components within normal limits  TROPONIN I - Abnormal; Notable for the following components:   Troponin I 0.05 (*)    All other components within normal limits  BRAIN NATRIURETIC PEPTIDE - Abnormal; Notable for the following components:  B Natriuretic Peptide 270.0 (*)    All other components within normal limits   ____________________________________________  EKG   EKG Interpretation  Date/Time:  Tuesday November 26 2017 11:59:51 EST Ventricular Rate:  90 PR Interval:    QRS Duration: 90 QT Interval:  376 QTC Calculation: 461 R Axis:   84 Text Interpretation:  Sinus rhythm Atrial premature complex Probable LVH with secondary repol abnrm Baseline wander in lead(s) I aVR V1 V2 V3 V4 V5  difficult to interpret secondary to wander but within those limitations, no acute changes from october are noticeable Confirmed by Merrily Pew [id number]) on 11/26/2017 1:59:01 PM       ____________________________________________  RADIOLOGY  Dg Chest 2 View  Result Date: 11/26/2017 CLINICAL DATA:  Shortness of breath.  Chronic renal failure EXAM: CHEST  2 VIEW COMPARISON:  November 18, 2017 FINDINGS: There is mild interstitial pulmonary edema, slightly more on the left than on the right. There is a minimal left pleural effusion. There is no airspace consolidation. There is cardiomegaly with mild pulmonary  venous hypertension. No adenopathy. No bone lesions. There are surgical clips overlying the left breast. IMPRESSION: Pulmonary vascular congestion with mild interstitial edema. No consolidation. Minimal left pleural effusion. Electronically Signed   By: Lowella Grip III M.D.   On: 11/26/2017 13:06   Ct Chest Wo Contrast  Result Date: 11/26/2017 CLINICAL DATA:  Shortness of breath developed during today's dialysis, coughing EXAM: CT CHEST WITHOUT CONTRAST TECHNIQUE: Multidetector CT imaging of the chest was performed following the standard protocol without IV contrast. COMPARISON:  Chest x-ray of 11/26/2017 FINDINGS: Cardiovascular: The heart is mildly enlarged. No pericardial effusion is seen. No significant coronary artery calcifications are evident. The mid ascending thoracic aorta measures 30 mm in diameter pre Mediastinum/Nodes: On this unenhanced study there are slightly prominent precarinal nodes with the largest measuring 14 mm in diameter most likely inflammatory or infectious in etiology. A prominent thyroid gland is noted. Lungs/Pleura: On lung window images, there is mosaic appearance of the lungs which may indicate small airway disease. Also there are slightly prominent interstitial markings particular at the lung bases which may indicate mild interstitial edema. Also the lung bases there are opacities posteriorly and inferiorly which do not have the typical appearance of atelectasis and may represent areas of developing pneumonia. The central airway is patent. Upper Abdomen: What is seen of the upper abdomen is unremarkable with the kidneys being somewhat small. Musculoskeletal: The thoracic vertebrae are normal alignment with degenerative change in the mid to lower thoracic spine. No compression deformity is seen. 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 There is fluid in the left mastectomy bed which may represent residual hematoma or seroma and clinical correlation is recommended.  IMPRESSION: 1. Mosaic appearance throughout the lungs suggests small airways disease. 2. Also there are somewhat prominent interstitial markings particularly at the lung bases most consistent with interstitial edema. 3. Opacities posteriorly in both lung bases do not appear typical of atelectasis and may represent developing pneumonia. Consider followup. 4. Fluid in the mid left mastectomy bed, seroma or hematoma. Correlate clinically. 5. Prominent thyroid gland. Electronically Signed   By: Ivar Drape M.D.   On: 11/26/2017 14:21    ____________________________________________   PROCEDURES  Procedure(s) performed:   Procedures   ____________________________________________   INITIAL IMPRESSION / ASSESSMENT AND PLAN / ED COURSE  Chest x-ray with concern for fluid overload however I do not hear any crackles on exam.  She does have decreased breath sounds.  She states is how she is felt in the past when she been diagnosed with pneumonia so we will start antibiotics and get a CT scan to better differentiate with his lung findings are.  She is hypoxic so she may need to be admitted.     Pertinent labs & imaging results that were available during my care of the patient were reviewed by me and considered in my medical decision making (see chart for details).  ____________________________________________  FINAL CLINICAL IMPRESSION(S) / ED DIAGNOSES  Final diagnoses:  Hypoxia  Shortness of breath  Recurrent pneumonia     MEDICATIONS GIVEN DURING THIS VISIT:  Medications  azithromycin (ZITHROMAX) 500 mg in dextrose 5 % 250 mL IVPB (500 mg Intravenous New Bag/Given 11/26/17 1459)  ipratropium-albuterol (DUONEB) 0.5-2.5 (3) MG/3ML nebulizer solution 3 mL (not administered)  cefTRIAXone (ROCEPHIN) 1 g in dextrose 5 % 50 mL IVPB (0 g Intravenous Stopped 11/26/17 1422)  acetaminophen (TYLENOL) tablet 650 mg (650 mg Oral Given 11/26/17 1520)     NEW OUTPATIENT MEDICATIONS STARTED DURING  THIS VISIT:  This SmartLink is deprecated. Use AVSMEDLIST instead to display the medication list for a patient.  Note:  This note was prepared with assistance of Dragon voice recognition software. Occasional wrong-word or sound-a-like substitutions may have occurred due to the inherent limitations of voice recognition software.   Merrily Pew, MD 11/26/17 1525

## 2017-11-27 ENCOUNTER — Inpatient Hospital Stay (HOSPITAL_COMMUNITY): Payer: PRIVATE HEALTH INSURANCE

## 2017-11-27 DIAGNOSIS — E0801 Diabetes mellitus due to underlying condition with hyperosmolarity with coma: Secondary | ICD-10-CM

## 2017-11-27 DIAGNOSIS — Z794 Long term (current) use of insulin: Secondary | ICD-10-CM

## 2017-11-27 DIAGNOSIS — I5030 Unspecified diastolic (congestive) heart failure: Secondary | ICD-10-CM

## 2017-11-27 DIAGNOSIS — E876 Hypokalemia: Secondary | ICD-10-CM

## 2017-11-27 DIAGNOSIS — Z992 Dependence on renal dialysis: Secondary | ICD-10-CM

## 2017-11-27 DIAGNOSIS — N186 End stage renal disease: Secondary | ICD-10-CM

## 2017-11-27 DIAGNOSIS — I10 Essential (primary) hypertension: Secondary | ICD-10-CM

## 2017-11-27 DIAGNOSIS — R748 Abnormal levels of other serum enzymes: Secondary | ICD-10-CM

## 2017-11-27 DIAGNOSIS — J189 Pneumonia, unspecified organism: Principal | ICD-10-CM

## 2017-11-27 LAB — RESPIRATORY PANEL BY PCR
ADENOVIRUS-RVPPCR: NOT DETECTED
Bordetella pertussis: NOT DETECTED
CHLAMYDOPHILA PNEUMONIAE-RVPPCR: NOT DETECTED
CORONAVIRUS HKU1-RVPPCR: NOT DETECTED
CORONAVIRUS NL63-RVPPCR: NOT DETECTED
CORONAVIRUS OC43-RVPPCR: NOT DETECTED
Coronavirus 229E: NOT DETECTED
Influenza A: NOT DETECTED
Influenza B: NOT DETECTED
Metapneumovirus: NOT DETECTED
Mycoplasma pneumoniae: NOT DETECTED
PARAINFLUENZA VIRUS 1-RVPPCR: NOT DETECTED
PARAINFLUENZA VIRUS 3-RVPPCR: NOT DETECTED
PARAINFLUENZA VIRUS 4-RVPPCR: NOT DETECTED
Parainfluenza Virus 2: NOT DETECTED
RHINOVIRUS / ENTEROVIRUS - RVPPCR: NOT DETECTED
Respiratory Syncytial Virus: NOT DETECTED

## 2017-11-27 LAB — GLUCOSE, CAPILLARY
GLUCOSE-CAPILLARY: 176 mg/dL — AB (ref 65–99)
GLUCOSE-CAPILLARY: 151 mg/dL — AB (ref 65–99)
Glucose-Capillary: 123 mg/dL — ABNORMAL HIGH (ref 65–99)
Glucose-Capillary: 194 mg/dL — ABNORMAL HIGH (ref 65–99)

## 2017-11-27 LAB — COMPREHENSIVE METABOLIC PANEL
ALK PHOS: 109 U/L (ref 38–126)
ALT: 25 U/L (ref 14–54)
ANION GAP: 13 (ref 5–15)
AST: 18 U/L (ref 15–41)
Albumin: 3.8 g/dL (ref 3.5–5.0)
BUN: 38 mg/dL — ABNORMAL HIGH (ref 6–20)
CALCIUM: 8.4 mg/dL — AB (ref 8.9–10.3)
CO2: 26 mmol/L (ref 22–32)
CREATININE: 8.92 mg/dL — AB (ref 0.44–1.00)
Chloride: 99 mmol/L — ABNORMAL LOW (ref 101–111)
GFR, EST AFRICAN AMERICAN: 5 mL/min — AB (ref >60)
GFR, EST NON AFRICAN AMERICAN: 5 mL/min — AB (ref >60)
Glucose, Bld: 160 mg/dL — ABNORMAL HIGH (ref 65–99)
Potassium: 4.3 mmol/L (ref 3.5–5.1)
Sodium: 138 mmol/L (ref 135–145)
TOTAL PROTEIN: 7.4 g/dL (ref 6.5–8.1)
Total Bilirubin: 0.9 mg/dL (ref 0.3–1.2)

## 2017-11-27 LAB — CBC
HCT: 32.8 % — ABNORMAL LOW (ref 36.0–46.0)
HEMOGLOBIN: 10 g/dL — AB (ref 12.0–15.0)
MCH: 27.5 pg (ref 26.0–34.0)
MCHC: 30.5 g/dL (ref 30.0–36.0)
MCV: 90.4 fL (ref 78.0–100.0)
Platelets: 268 10*3/uL (ref 150–400)
RBC: 3.63 MIL/uL — AB (ref 3.87–5.11)
RDW: 15 % (ref 11.5–15.5)
WBC: 8.9 10*3/uL (ref 4.0–10.5)

## 2017-11-27 LAB — ECHOCARDIOGRAM COMPLETE
AO mean calculated velocity dopler: 133 cm/s
AOPV: 0.74 m/s
AOVTI: 42.5 cm
AV Area VTI index: 1.02 cm2/m2
AV Area mean vel: 2.31 cm2
AV VEL mean LVOT/AV: 0.74
AV vel: 2.33
AVA: 2.33 cm2
AVAREAMEANVIN: 1.01 cm2/m2
AVAREAVTI: 2.32 cm2
AVG: 8 mmHg
AVLVOTPG: 8 mmHg
AVPG: 15 mmHg
AVPKVEL: 196 cm/s
CHL CUP AV PEAK INDEX: 1.01
CHL CUP MV DEC (S): 243
E decel time: 243 msec
EERAT: 18.26
FS: 46 % — AB (ref 28–44)
Height: 63 in
IV/PV OW: 0.73
LA ID, A-P, ES: 50 mm
LA vol index: 43.1 mL/m2
LA vol: 98.9 mL
LADIAMINDEX: 2.18 cm/m2
LAVOLA4C: 114 mL
LDCA: 3.14 cm2
LEFT ATRIUM END SYS DIAM: 50 mm
LV PW d: 18.2 mm — AB (ref 0.6–1.1)
LV TDI E'LATERAL: 7.83
LV TDI E'MEDIAL: 6.96
LV dias vol index: 54 mL/m2
LV e' LATERAL: 7.83 cm/s
LVDIAVOL: 123 mL — AB (ref 46–106)
LVEEAVG: 18.26
LVEEMED: 18.26
LVOT VTI: 31.6 cm
LVOT peak VTI: 0.74 cm
LVOT peak vel: 145 cm/s
LVOTD: 20 mm
LVOTSV: 99 mL
MV pk A vel: 124 m/s
MVPG: 8 mmHg
MVPKEVEL: 143 m/s
RV LATERAL S' VELOCITY: 16.5 cm/s
RV TAPSE: 29 mm
Valve area index: 1.02
WEIGHTICAEL: 3950.64 [oz_av]

## 2017-11-27 LAB — TROPONIN I: Troponin I: 0.05 ng/mL (ref <0.03)

## 2017-11-27 LAB — SEDIMENTATION RATE: Sed Rate: 35 mm/hr — ABNORMAL HIGH (ref 0–22)

## 2017-11-27 LAB — STREP PNEUMONIAE URINARY ANTIGEN: STREP PNEUMO URINARY ANTIGEN: NEGATIVE

## 2017-11-27 MED ORDER — ACETAMINOPHEN 325 MG PO TABS
650.0000 mg | ORAL_TABLET | ORAL | Status: DC | PRN
  Administered 2017-11-27 – 2017-11-28 (×3): 650 mg via ORAL
  Filled 2017-11-27 (×3): qty 2

## 2017-11-27 MED ORDER — IPRATROPIUM-ALBUTEROL 0.5-2.5 (3) MG/3ML IN SOLN
3.0000 mL | Freq: Three times a day (TID) | RESPIRATORY_TRACT | Status: DC
  Administered 2017-11-28 (×2): 3 mL via RESPIRATORY_TRACT
  Filled 2017-11-27 (×2): qty 3

## 2017-11-27 MED ORDER — PREDNISONE 20 MG PO TABS
40.0000 mg | ORAL_TABLET | Freq: Every day | ORAL | Status: DC
  Administered 2017-11-27 – 2017-11-28 (×2): 40 mg via ORAL
  Filled 2017-11-27 (×2): qty 2

## 2017-11-27 MED ORDER — ROPINIROLE HCL 1 MG PO TABS
1.0000 mg | ORAL_TABLET | Freq: Two times a day (BID) | ORAL | Status: DC
  Administered 2017-11-27 – 2017-11-28 (×3): 1 mg via ORAL
  Filled 2017-11-27 (×3): qty 1

## 2017-11-27 MED ORDER — ORAL CARE MOUTH RINSE
15.0000 mL | Freq: Two times a day (BID) | OROMUCOSAL | Status: DC
  Administered 2017-11-27: 15 mL via OROMUCOSAL

## 2017-11-27 MED ORDER — IPRATROPIUM-ALBUTEROL 0.5-2.5 (3) MG/3ML IN SOLN
3.0000 mL | Freq: Four times a day (QID) | RESPIRATORY_TRACT | Status: DC
  Administered 2017-11-27: 3 mL via RESPIRATORY_TRACT
  Filled 2017-11-27: qty 3

## 2017-11-27 NOTE — Progress Notes (Signed)
PROGRESS NOTE    Patricia [name]  XKG:[id number] DOB: [birth year] DOA: 11/26/2017 PCP: Jake Samples, PA-C    Brief Narrative:  46 year old female with a history of end-stage renal disease on hematology who presented with shortness of breath and found to have community acquired pneumonia.  On intravenous antibiotics.   Assessment & Plan:   Principal Problem:   Dyspnea Active Problems:   End stage renal disease on dialysis ([place])   Diabetes mellitus ([place])   Essential hypertension   Hypokalemia   CAP (community acquired pneumonia)   Anemia   Elevated troponin   1. Community-acquired pneumonia.  On intravenous antibiotics.  She may have an asthmatic component and has been started on steroids.  Continue coronary hygiene.  Appreciate pulmonology assistance. 2. End-stage renal disease on hemodialysis.  Nephrology following.  Patient underwent dialysis today. 3. Elevated troponin.  Echocardiogram shows normal EF with no wall motion abnormalities.  Grade 2 diastolic dysfunction.  Troponins have been flat at 0.05.  Does not appear to be due to ACS. 4. Diabetes.  Continue on glipizide.  Continue sliding scale insulin.  Blood sugar stable 5. Hypertension.  Continue on labetalol, nifedipine and furosemide.  Blood pressure stable 6. GERD.  Continue Protonix   DVT prophylaxis: Heparin Code Status: Full code Family Communication:  Disposition Plan: Discharge home once improved   Consultants:   nephrology  Procedures:     Antimicrobials:   Rocephin 12/12>  Azithromycin 12/12>    Subjective: Feeling better. Has nonproductive cough. Still short of breath  Objective: Vitals:   11/27/17 1715 11/27/17 1730 11/27/17 1800 11/27/17 1830  BP: (!) 146/79 (!) 161/87 (!) 174/84 (!) 169/89  Pulse: 86 86 81 83  Resp: 18     Temp: 98.4 F (36.9 C)     TempSrc: Oral     SpO2: 100%     Weight: 112 kg (246 lb 14.6 oz)     Height:        Intake/Output Summary (Last 24 hours) at  11/27/2017 1848 Last data filed at 11/27/2017 1306 Gross per 24 hour  Intake 783 ml  Output 200 ml  Net 583 ml   Filed Weights   11/26/17 1641 11/27/17 0621 11/27/17 1715  Weight: 111.1 kg (245 lb) 112 kg (246 lb 14.6 oz) 112 kg (246 lb 14.6 oz)    Examination:  General exam: Appears calm and comfortable  Respiratory system: Clear to auscultation. Respiratory effort normal. Cardiovascular system: S1 & S2 heard, RRR. No JVD, murmurs, rubs, gallops or clicks. No pedal edema. Gastrointestinal system: Abdomen is nondistended, soft and nontender. No organomegaly or masses felt. Normal bowel sounds heard. Central nervous system: Alert and oriented. No focal neurological deficits. Extremities: Symmetric 5 x 5 power. Skin: No rashes, lesions or ulcers Psychiatry: Judgement and insight appear normal. Mood & affect appropriate.     Data Reviewed: I have personally reviewed following labs and imaging studies  CBC: Recent Labs  Lab 11/26/17 1217 11/27/17 0409  WBC 10.9* 8.9  NEUTROABS 8.6*  --   HGB 10.1* 10.0*  HCT 31.9* 32.8*  MCV 89.6 90.4  PLT 288 702   Basic Metabolic Panel: Recent Labs  Lab 11/26/17 1217 11/27/17 0409  NA 138 138  K 3.3* 4.3  CL 98* 99*  CO2 25 26  GLUCOSE 92 160*  BUN 28* 38*  CREATININE 6.90* 8.92*  CALCIUM 9.1 8.4*   GFR: Estimated Creatinine Clearance: 9.3 mL/min (A) (by C-G formula based on SCr of 8.92 mg/dL (H)).  Liver Function Tests: Recent Labs  Lab 11/26/17 1217 11/27/17 0409  AST 22 18  ALT 23 25  ALKPHOS 117 109  BILITOT 1.2 0.9  PROT 8.3* 7.4  ALBUMIN 4.3 3.8   No results for input(s): LIPASE, AMYLASE in the last 168 hours. No results for input(s): AMMONIA in the last 168 hours. Coagulation Profile: No results for input(s): INR, PROTIME in the last 168 hours. Cardiac Enzymes: Recent Labs  Lab 11/26/17 1217 11/26/17 1802 11/26/17 2235 11/27/17 0409  TROPONINI 0.05* 0.05* 0.05* 0.05*   BNP (last 3 results) No  results for input(s): PROBNP in the last 8760 hours. HbA1C: No results for input(s): HGBA1C in the last 72 hours. CBG: Recent Labs  Lab 11/26/17 1651 11/26/17 2113 11/27/17 0728 11/27/17 1116 11/27/17 1623  GLUCAP 101* 213* 123* 194* 176*   Lipid Profile: No results for input(s): CHOL, HDL, LDLCALC, TRIG, CHOLHDL, LDLDIRECT in the last 72 hours. Thyroid Function Tests: No results for input(s): TSH, T4TOTAL, FREET4, T3FREE, THYROIDAB in the last 72 hours. Anemia Panel: No results for input(s): VITAMINB12, FOLATE, FERRITIN, TIBC, IRON, RETICCTPCT in the last 72 hours. Sepsis Labs: No results for input(s): PROCALCITON, LATICACIDVEN in the last 168 hours.  Recent Results (from the past 240 hour(s))  Respiratory Panel by PCR     Status: None   Collection Time: 11/26/17  4:41 PM  Result Value Ref Range Status   Adenovirus NOT DETECTED NOT DETECTED Final   Coronavirus 229E NOT DETECTED NOT DETECTED Final   Coronavirus HKU1 NOT DETECTED NOT DETECTED Final   Coronavirus NL63 NOT DETECTED NOT DETECTED Final   Coronavirus OC43 NOT DETECTED NOT DETECTED Final   Metapneumovirus NOT DETECTED NOT DETECTED Final   Rhinovirus / Enterovirus NOT DETECTED NOT DETECTED Final   Influenza A NOT DETECTED NOT DETECTED Final   Influenza B NOT DETECTED NOT DETECTED Final   Parainfluenza Virus 1 NOT DETECTED NOT DETECTED Final   Parainfluenza Virus 2 NOT DETECTED NOT DETECTED Final   Parainfluenza Virus 3 NOT DETECTED NOT DETECTED Final   Parainfluenza Virus 4 NOT DETECTED NOT DETECTED Final   Respiratory Syncytial Virus NOT DETECTED NOT DETECTED Final   Bordetella pertussis NOT DETECTED NOT DETECTED Final   Chlamydophila pneumoniae NOT DETECTED NOT DETECTED Final   Mycoplasma pneumoniae NOT DETECTED NOT DETECTED Final    Comment: Performed at [facility], [place] [address], [place], [place] [address]  Culture, blood (routine x 2) Call MD if unable to obtain prior to antibiotics being given      Status: None (Preliminary result)   Collection Time: 11/26/17  6:02 PM  Result Value Ref Range Status   Specimen Description RIGHT ANTECUBITAL  Final   Special Requests   Final    BOTTLES DRAWN AEROBIC AND ANAEROBIC Blood Culture results may not be optimal due to an inadequate volume of blood received in culture bottles   Culture NO GROWTH < 24 HOURS  Final   Report Status PENDING  Incomplete  Culture, blood (routine x 2) Call MD if unable to obtain prior to antibiotics being given     Status: None (Preliminary result)   Collection Time: 11/26/17  6:02 PM  Result Value Ref Range Status   Specimen Description BLOOD RIGHT FOREARM  Final   Special Requests   Final    BOTTLES DRAWN AEROBIC AND ANAEROBIC Blood Culture results may not be optimal due to an inadequate volume of blood received in culture bottles   Culture NO GROWTH < 24  HOURS  Final   Report Status PENDING  Incomplete         Radiology Studies: Dg Chest 2 View  Result Date: 11/26/2017 CLINICAL DATA:  Shortness of breath.  Chronic renal failure EXAM: CHEST  2 VIEW COMPARISON:  November 18, 2017 FINDINGS: There is mild interstitial pulmonary edema, slightly more on the left than on the right. There is a minimal left pleural effusion. There is no airspace consolidation. There is cardiomegaly with mild pulmonary venous hypertension. No adenopathy. No bone lesions. There are surgical clips overlying the left breast. IMPRESSION: Pulmonary vascular congestion with mild interstitial edema. No consolidation. Minimal left pleural effusion. Electronically Signed   By: Lowella Grip III M.D.   On: 11/26/2017 13:06   Ct Chest Wo Contrast  Result Date: 11/26/2017 CLINICAL DATA:  Shortness of breath developed during today's dialysis, coughing EXAM: CT CHEST WITHOUT CONTRAST TECHNIQUE: Multidetector CT imaging of the chest was performed following the standard protocol without IV contrast. COMPARISON:  Chest x-ray of 11/26/2017 FINDINGS:  Cardiovascular: The heart is mildly enlarged. No pericardial effusion is seen. No significant coronary artery calcifications are evident. The mid ascending thoracic aorta measures 30 mm in diameter pre Mediastinum/Nodes: On this unenhanced study there are slightly prominent precarinal nodes with the largest measuring 14 mm in diameter most likely inflammatory or infectious in etiology. A prominent thyroid gland is noted. Lungs/Pleura: On lung window images, there is mosaic appearance of the lungs which may indicate small airway disease. Also there are slightly prominent interstitial markings particular at the lung bases which may indicate mild interstitial edema. Also the lung bases there are opacities posteriorly and inferiorly which do not have the typical appearance of atelectasis and may represent areas of developing pneumonia. The central airway is patent. Upper Abdomen: What is seen of the upper abdomen is unremarkable with the kidneys being somewhat small. Musculoskeletal: The thoracic vertebrae are normal alignment with degenerative change in the mid to lower thoracic spine. No compression deformity is seen. 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 There is fluid in the left mastectomy bed which may represent residual hematoma or seroma and clinical correlation is recommended. IMPRESSION: 1. Mosaic appearance throughout the lungs suggests small airways disease. 2. Also there are somewhat prominent interstitial markings particularly at the lung bases most consistent with interstitial edema. 3. Opacities posteriorly in both lung bases do not appear typical of atelectasis and may represent developing pneumonia. Consider followup. 4. Fluid in the mid left mastectomy bed, seroma or hematoma. Correlate clinically. 5. Prominent thyroid gland. Electronically Signed   By: Ivar Drape M.D.   On: 11/26/2017 14:21        Scheduled Meds: . cinacalcet  60 mg Oral QPM  . ferric citrate  420 mg Oral BID WC  .  fluticasone  2 spray Each Nare Daily  . furosemide  40 mg Oral QHS  . furosemide  80 mg Oral Daily  . guaiFENesin  1,200 mg Oral BID  . heparin  5,000 Units Subcutaneous Q8H  . insulin aspart  0-5 Units Subcutaneous QHS  . insulin aspart  0-9 Units Subcutaneous TID WC  . ipratropium-albuterol  3 mL Nebulization Q6H  . labetalol  200 mg Oral BID  . lubiprostone  24 mcg Oral BID WC  . mouth rinse  15 mL Mouth Rinse BID  . multivitamin  1 tablet Oral Daily  . NIFEdipine  120 mg Oral Daily  . pantoprazole  40 mg Oral Daily  . predniSONE  40 mg Oral Q breakfast  . rOPINIRole  1 mg Oral BID  . sevelamer carbonate  1,600 mg Oral TID WC  . sodium chloride flush  3 mL Intravenous Q12H   Continuous Infusions: . sodium chloride    . azithromycin    . cefTRIAXone (ROCEPHIN)  IV       LOS: 1 day    Time spent: 43mins    Kathie Dike, MD [facility] Pager [phone]  If 7PM-7AM, please contact night-coverage [email or web address] Password [facility] 11/27/2017, 6:48 PM

## 2017-11-27 NOTE — Progress Notes (Signed)
Patricia [name]  MRN: [id number]  DOB/AGE: [birth year] 48 y.o.  Primary Care Physician:Jackson, Hulen Shouts, PA-C  Admit date: 11/26/2017  Chief Complaint:  Chief Complaint  Patient presents with  . Shortness of Breath    S-Pt presented on  11/26/2017 with  Chief Complaint  Patient presents with  . Shortness of Breath  .    Pt today feels better.    Pt offers no new complaints.    Pt says " I need my dialysis today"    Meds . cinacalcet  60 mg Oral QPM  . ferric citrate  420 mg Oral BID WC  . fluticasone  2 spray Each Nare Daily  . furosemide  40 mg Oral QHS  . furosemide  80 mg Oral Daily  . guaiFENesin  1,200 mg Oral BID  . heparin  5,000 Units Subcutaneous Q8H  . insulin aspart  0-5 Units Subcutaneous QHS  . insulin aspart  0-9 Units Subcutaneous TID WC  . ipratropium-albuterol  3 mL Nebulization Q4H  . labetalol  200 mg Oral BID  . lubiprostone  24 mcg Oral BID WC  . mouth rinse  15 mL Mouth Rinse BID  . multivitamin  1 tablet Oral Daily  . NIFEdipine  120 mg Oral Daily  . pantoprazole  40 mg Oral Daily  . predniSONE  40 mg Oral Q breakfast  . rOPINIRole  1 mg Oral BID  . sevelamer carbonate  1,600 mg Oral TID WC  . sodium chloride flush  3 mL Intravenous Q12H     Physical Exam: Vital signs in last 24 hours: Temp:  [98 F (36.7 C)-99.4 F (37.4 C)] 98.4 F (36.9 C) (12/12 0621) Pulse Rate:  [77-89] 81 (12/12 0640) Resp:  [14-28] 14 (12/12 0621) BP: (130-169)/(63-88) 130/67 (12/12 0621) SpO2:  [89 %-100 %] 92 % (12/12 0808) FiO2 (%):  [2 %] 2 % (12/11 1641) Weight:  [245 lb (111.1 kg)-246 lb 14.6 oz (112 kg)] 246 lb 14.6 oz (112 kg) (12/12 0621) Weight change:  Last BM Date: 11/25/17  Intake/Output from previous day: 12/11 0701 - 12/12 0700 In: 603 [P.O.:300; I.V.:3; IV Piggyback:300] Out: 200 [Urine:200] Total I/O In: 240 [P.O.:240] Out: -    Physical Exam: General- pt is awake,alert, oriented to time place and person Resp- No acute REsp  distress,  Rhonchi present CVS- S1S2 regular in rate and rhythm GIT- BS+, soft, NT, ND EXT- NO LE Edema, NO Cyanosis Access- Left AVF + bruit  Lab Results: CBC Recent Labs    11/26/17 1217 11/27/17 0409  WBC 10.9* 8.9  HGB 10.1* 10.0*  HCT 31.9* 32.8*  PLT 288 268    BMET Recent Labs    11/26/17 1217 11/27/17 0409  NA 138 138  K 3.3* 4.3  CL 98* 99*  CO2 25 26  GLUCOSE 92 160*  BUN 28* 38*  CREATININE 6.90* 8.92*  CALCIUM 9.1 8.4*    MICRO Recent Results (from the past 240 hour(s))  Culture, blood (routine x 2) Call MD if unable to obtain prior to antibiotics being given     Status: None (Preliminary result)   Collection Time: 11/26/17  6:02 PM  Result Value Ref Range Status   Specimen Description RIGHT ANTECUBITAL  Final   Special Requests   Final    BOTTLES DRAWN AEROBIC AND ANAEROBIC Blood Culture results may not be optimal due to an inadequate volume of blood received in culture bottles   Culture NO GROWTH < 24 HOURS  Final  Report Status PENDING  Incomplete  Culture, blood (routine x 2) Call MD if unable to obtain prior to antibiotics being given     Status: None (Preliminary result)   Collection Time: 11/26/17  6:02 PM  Result Value Ref Range Status   Specimen Description BLOOD RIGHT FOREARM  Final   Special Requests   Final    BOTTLES DRAWN AEROBIC AND ANAEROBIC Blood Culture results may not be optimal due to an inadequate volume of blood received in culture bottles   Culture NO GROWTH < 24 HOURS  Final   Report Status PENDING  Incomplete      Lab Results  Component Value Date   CALCIUM 8.4 (L) 11/27/2017   CAION 0.97 (L) 09/27/2016   PHOS 5.7 (H) 09/30/2017          Impression: 1)Renal  ESRD on HD                Pt is on Mon/Wed/Fri schedule                Pt was dialyzed yesterday                Will dialyze today  2)HTN   BP  at goal          Medication-  On Diuretics-Lasix On Calcium Channel Blockers On Alpha and beta  Blockers    3)Anemia IN ESRD the goal for  HGb 9--11 HGb is not at goal    4)CKD Mineral-Bone Disorder  Phosphorus at goal. Calcium is  at goal.  5)REsp - admitted with Dyspnea Pneumonia .   6)Electrolytes  Hyperkalemic  sec to ESRD    normonatremic   7)Acid base Co2 at goal      Plan:  Will dialyze pt to as this is her scheduled day  Will use 3k bath Will try to take  1.5 liters off if pt tolerates   BHUTANI,MANPREET S 11/27/2017, 11:26 AM

## 2017-11-27 NOTE — Progress Notes (Signed)
*  PRELIMINARY RESULTS* Echocardiogram 2D Echocardiogram has been performed.  Samuel [place] 11/27/2017, 3:29 PM

## 2017-11-27 NOTE — Progress Notes (Signed)
Consult requested by: [facility] hospitalist,Dr. Dhungel Consult requested for: Pneumonia  HPI: This is a 46 year old who has had 2 previous hospitalizations for what appeared to be atypical pneumonia.  I had seen her in my office and requested pulmonary function testing.  She had gone to dialysis and was markedly hypertensive in dialysis and received what I believe was hydralazine.  She was apparently unable to get all of her fluid off because of cramping.  It is not totally clear to me if she was able to reach her dry weight with dialysis yesterday or not.  At any rate she had dialysis done went to the respiratory department to have pulmonary function testing done developed severe shortness of breath when she started having the test was hypoxic and ended up being sent to the emergency department.  In the emergency department she was found to have what appears to be early pneumonia.  She has been having cough and congestion with greenish sputum for the several days prior to admission.  She has some symptoms suggestive that she might have orthopnea but her congestion is mostly in her head.  She has had hot and cold feeling but no definite fever.  No definite chills.  She does not have a history of asthma but she says when she went out in cold weather this week it caused her to become very short of breath.  She apparently has sleep apnea and is not using CPAP.  I cannot find a sleep study in the record.  She has not had any chest pain but her troponin level is up.  She had echocardiogram last admission which was essentially normal.  She had workup to see if she was having trouble perhaps with immunoglobulin levels causing her to have recurrent pneumonia and those were normal.  Sedimentation rate was 50 which I think is consistent with pneumonia but not so much consistent with other problems like chronic organizing pneumonia.  ANA was negative.  She is chronically on dialysis.  She has diabetes and hypertension at  baseline.  Past Medical History:  Diagnosis Date  . Blood transfusion without reported diagnosis   . Cancer ([place])   . Diabetes mellitus without complication ([place])   . Dialysis patient ([place])    mon, wed, friday,   . Hypertension   . OSA (obstructive sleep apnea)   . Renal disorder   . Renal insufficiency      Family History  Problem Relation Age of Onset  . Diabetes Mellitus II Mother   . Hypertension Mother   . Hypertension Sister   . Hypertension Sister   . Colon cancer Neg Hx     She says her family history is very positive for asthma Social History   Socioeconomic History  . Marital status: Widowed    Spouse name: None  . Number of children: None  . Years of education: None  . Highest education level: None  Social Needs  . Financial resource strain: None  . Food insecurity - worry: None  . Food insecurity - inability: None  . Transportation needs - medical: None  . Transportation needs - non-medical: None  Occupational History  . None  Tobacco Use  . Smoking status: Never Smoker  . Smokeless tobacco: Never Used  Substance and Sexual Activity  . Alcohol use: No    Alcohol/week: 0.0 oz  . Drug use: No  . Sexual activity: None  Other Topics Concern  . None  Social History Narrative  . None  ROS: Except as mentioned 10 point review of systems is negative    Objective: Vital signs in last 24 hours: Temp:  [98 F (36.7 C)-99.4 F (37.4 C)] 98.4 F (36.9 C) (12/12 0621) Pulse Rate:  [77-89] 81 (12/12 0640) Resp:  [14-28] 14 (12/12 0621) BP: (130-169)/(63-88) 130/67 (12/12 0621) SpO2:  [89 %-100 %] 92 % (12/12 0808) FiO2 (%):  [2 %] 2 % (12/11 1641) Weight:  [111.1 kg (245 lb)-112 kg (246 lb 14.6 oz)] 112 kg (246 lb 14.6 oz) (12/12 4098) Weight change:  Last BM Date: 11/25/17  Intake/Output from previous day: 12/11 0701 - 12/12 0700 In: 603 [P.O.:300; I.V.:3; IV Piggyback:300] Out: 200 [Urine:200]  PHYSICAL EXAM Constitutional: She is awake  and alert and in no acute distress.  She is obese.  Eyes: Pupils react.  EOMI.  Ears nose mouth and throat: Her mucous membranes are moist.  Throat is clear.  She is mildly tender in the maxillary and frontal sinuses on the left.  Cardiovascular: Her heart is regular with normal heart sounds.  Respiratory: Her respiratory effort is normal and she has some crackles in the bases bilaterally but no wheezing now.  Skin: Warm and dry.  Gastrointestinal: Her abdomen is soft with no masses.  Musculoskeletal: Normal muscle strength bilaterally.  Neurological: No focal abnormalities.  Psychiatric: Normal mood and affect  Lab Results: Basic Metabolic Panel: Recent Labs    11/26/17 1217 11/27/17 0409  NA 138 138  K 3.3* 4.3  CL 98* 99*  CO2 25 26  GLUCOSE 92 160*  BUN 28* 38*  CREATININE 6.90* 8.92*  CALCIUM 9.1 8.4*   Liver Function Tests: Recent Labs    11/26/17 1217 11/27/17 0409  AST 22 18  ALT 23 25  ALKPHOS 117 109  BILITOT 1.2 0.9  PROT 8.3* 7.4  ALBUMIN 4.3 3.8   No results for input(s): LIPASE, AMYLASE in the last 72 hours. No results for input(s): AMMONIA in the last 72 hours. CBC: Recent Labs    11/26/17 1217 11/27/17 0409  WBC 10.9* 8.9  NEUTROABS 8.6*  --   HGB 10.1* 10.0*  HCT 31.9* 32.8*  MCV 89.6 90.4  PLT 288 268   Cardiac Enzymes: Recent Labs    11/26/17 1802 11/26/17 2235 11/27/17 0409  TROPONINI 0.05* 0.05* 0.05*   BNP: No results for input(s): PROBNP in the last 72 hours. D-Dimer: No results for input(s): DDIMER in the last 72 hours. CBG: Recent Labs    11/26/17 1554 11/26/17 1651 11/26/17 2113 11/27/17 0728  GLUCAP 113* 101* 213* 123*   Hemoglobin A1C: No results for input(s): HGBA1C in the last 72 hours. Fasting Lipid Panel: No results for input(s): CHOL, HDL, LDLCALC, TRIG, CHOLHDL, LDLDIRECT in the last 72 hours. Thyroid Function Tests: No results for input(s): TSH, T4TOTAL, FREET4, T3FREE, THYROIDAB in the last 72 hours. Anemia  Panel: No results for input(s): VITAMINB12, FOLATE, FERRITIN, TIBC, IRON, RETICCTPCT in the last 72 hours. Coagulation: No results for input(s): LABPROT, INR in the last 72 hours. Urine Drug Screen: Drugs of Abuse  No results found for: LABOPIA, COCAINSCRNUR, LABBENZ, AMPHETMU, THCU, LABBARB  Alcohol Level: No results for input(s): ETH in the last 72 hours. Urinalysis: No results for input(s): COLORURINE, LABSPEC, PHURINE, GLUCOSEU, HGBUR, BILIRUBINUR, KETONESUR, PROTEINUR, UROBILINOGEN, NITRITE, LEUKOCYTESUR in the last 72 hours.  Invalid input(s): APPERANCEUR Misc. Labs:   ABGS: No results for input(s): PHART, PO2ART, TCO2, HCO3 in the last 72 hours.  Invalid input(s): PCO2   MICROBIOLOGY: Recent  Results (from the past 240 hour(s))  Culture, blood (routine x 2) Call MD if unable to obtain prior to antibiotics being given     Status: None (Preliminary result)   Collection Time: 11/26/17  6:02 PM  Result Value Ref Range Status   Specimen Description RIGHT ANTECUBITAL  Final   Special Requests   Final    BOTTLES DRAWN AEROBIC AND ANAEROBIC Blood Culture results may not be optimal due to an inadequate volume of blood received in culture bottles   Culture PENDING  Incomplete   Report Status PENDING  Incomplete  Culture, blood (routine x 2) Call MD if unable to obtain prior to antibiotics being given     Status: None (Preliminary result)   Collection Time: 11/26/17  6:02 PM  Result Value Ref Range Status   Specimen Description BLOOD RIGHT FOREARM  Final   Special Requests   Final    BOTTLES DRAWN AEROBIC AND ANAEROBIC Blood Culture results may not be optimal due to an inadequate volume of blood received in culture bottles   Culture PENDING  Incomplete   Report Status PENDING  Incomplete    Studies/Results: Dg Chest 2 View  Result Date: 11/26/2017 CLINICAL DATA:  Shortness of breath.  Chronic renal failure EXAM: CHEST  2 VIEW COMPARISON:  November 18, 2017 FINDINGS: There is  mild interstitial pulmonary edema, slightly more on the left than on the right. There is a minimal left pleural effusion. There is no airspace consolidation. There is cardiomegaly with mild pulmonary venous hypertension. No adenopathy. No bone lesions. There are surgical clips overlying the left breast. IMPRESSION: Pulmonary vascular congestion with mild interstitial edema. No consolidation. Minimal left pleural effusion. Electronically Signed   By: Lowella Grip III M.D.   On: 11/26/2017 13:06   Ct Chest Wo Contrast  Result Date: 11/26/2017 CLINICAL DATA:  Shortness of breath developed during today's dialysis, coughing EXAM: CT CHEST WITHOUT CONTRAST TECHNIQUE: Multidetector CT imaging of the chest was performed following the standard protocol without IV contrast. COMPARISON:  Chest x-ray of 11/26/2017 FINDINGS: Cardiovascular: The heart is mildly enlarged. No pericardial effusion is seen. No significant coronary artery calcifications are evident. The mid ascending thoracic aorta measures 30 mm in diameter pre Mediastinum/Nodes: On this unenhanced study there are slightly prominent precarinal nodes with the largest measuring 14 mm in diameter most likely inflammatory or infectious in etiology. A prominent thyroid gland is noted. Lungs/Pleura: On lung window images, there is mosaic appearance of the lungs which may indicate small airway disease. Also there are slightly prominent interstitial markings particular at the lung bases which may indicate mild interstitial edema. Also the lung bases there are opacities posteriorly and inferiorly which do not have the typical appearance of atelectasis and may represent areas of developing pneumonia. The central airway is patent. Upper Abdomen: What is seen of the upper abdomen is unremarkable with the kidneys being somewhat small. Musculoskeletal: The thoracic vertebrae are normal alignment with degenerative change in the mid to lower thoracic spine. No compression  deformity is seen. 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 There is fluid in the left mastectomy bed which may represent residual hematoma or seroma and clinical correlation is recommended. IMPRESSION: 1. Mosaic appearance throughout the lungs suggests small airways disease. 2. Also there are somewhat prominent interstitial markings particularly at the lung bases most consistent with interstitial edema. 3. Opacities posteriorly in both lung bases do not appear typical of atelectasis and may represent developing pneumonia. Consider followup. 4. Fluid in  the mid left mastectomy bed, seroma or hematoma. Correlate clinically. 5. Prominent thyroid gland. Electronically Signed   By: Ivar Drape M.D.   On: 11/26/2017 14:21    Medications:  Prior to Admission:  Medications Prior to Admission  Medication Sig Dispense Refill Last Dose  . acetaminophen (TYLENOL) 500 MG tablet Take 500 mg by mouth 3 (three) times daily as needed for moderate pain.   unknown  . AMITIZA 24 MCG capsule TAKE 1 CAPSULE (24 MCG TOTAL) BY MOUTH 2 (TWO) TIMES DAILY WITH A MEAL. 60 capsule 5 11/25/2017 at Unknown time  . chlorhexidine (PERIDEX) 0.12 % solution Swish and spit 15 mLs 2 (two) times daily.   11/26/2017 at Unknown time  . cloNIDine (CATAPRES) 0.2 MG tablet Take 0.2 mg by mouth once.   11/26/2017 at Unknown time  . Ferric Citrate (AURYXIA) 1 GM 210 MG(Fe) TABS Take 2 tablets by mouth 2 (two) times daily with a meal.   11/25/2017 at Unknown time  . fluticasone (FLONASE) 50 MCG/ACT nasal spray Place 2 sprays into both nostrils daily. LEFT NARE ONLY   11/26/2017 at Unknown time  . furosemide (LASIX) 40 MG tablet Take 40-80 mg by mouth 2 (two) times daily. 80mg  in the morning and 40mg  at bedtime   11/25/2017 at Unknown time  . glipiZIDE (GLUCOTROL) 5 MG tablet Take 5 mg by mouth 2 (two) times daily as needed. For blood sugar levels over 150   11/25/2017 at Unknown time  . guaiFENesin (MUCINEX) 600 MG 12 hr tablet Take 1,200 mg  by mouth 2 (two) times daily.   11/25/2017 at Unknown time  . labetalol (NORMODYNE) 200 MG tablet Take 200 mg by mouth 2 (two) times daily.   11/25/2017 at 2200  . multivitamin (RENA-VIT) TABS tablet Take 1 tablet by mouth daily.   11/25/2017 at Unknown time  . NIFEdipine (PROCARDIA-XL/ADALAT CC) 60 MG 24 hr tablet Take 1 tablet (60 mg total) by mouth daily. (Patient taking differently: Take 120 mg by mouth daily. ) 30 tablet 2 11/25/2017 at Unknown time  . pantoprazole (PROTONIX) 20 MG tablet Take 20 mg by mouth daily.  1 11/25/2017 at Unknown time  . rOPINIRole (REQUIP XL) 2 MG 24 hr tablet Take 2 mg by mouth at bedtime.    11/25/2017 at Unknown time  . SENSIPAR 60 MG tablet Take 60 mg by mouth every evening.   11/25/2017 at Unknown time  . sevelamer carbonate (RENVELA) 800 MG tablet Take 1,600 mg by mouth 3 (three) times daily with meals.    11/25/2017 at Unknown time  . amoxicillin-clavulanate (AUGMENTIN) 500-125 MG tablet Take 1 tablet (500 mg total) by mouth at bedtime. (Patient not taking: Reported on 11/26/2017) 3 tablet 0 Not Taking at Unknown time  . azithromycin (ZITHROMAX) 500 MG tablet Take 1 tablet (500 mg total) by mouth daily. (Patient not taking: Reported on 11/26/2017) 3 tablet 0 Not Taking at Unknown time   Scheduled: . cinacalcet  60 mg Oral QPM  . ferric citrate  420 mg Oral BID WC  . fluticasone  2 spray Each Nare Daily  . furosemide  40 mg Oral QHS  . furosemide  80 mg Oral Daily  . guaiFENesin  1,200 mg Oral BID  . heparin  5,000 Units Subcutaneous Q8H  . insulin aspart  0-5 Units Subcutaneous QHS  . insulin aspart  0-9 Units Subcutaneous TID WC  . ipratropium-albuterol  3 mL Nebulization Q4H  . labetalol  200 mg Oral BID  .  lubiprostone  24 mcg Oral BID WC  . mouth rinse  15 mL Mouth Rinse BID  . multivitamin  1 tablet Oral Daily  . NIFEdipine  120 mg Oral Daily  . pantoprazole  40 mg Oral Daily  . rOPINIRole  1 mg Oral BID  . sevelamer carbonate  1,600 mg Oral  TID WC  . sodium chloride flush  3 mL Intravenous Q12H   Continuous: . sodium chloride    . azithromycin    . cefTRIAXone (ROCEPHIN)  IV     QAS:UORVIF chloride, acetaminophen, hydrALAZINE, sodium chloride flush  Assesment: She was admitted with community-acquired pneumonia.  She is being treated appropriately for that.  She may have some asthmatic component of her problem considering her difficulty with cold air and her very positive family history.  She was unfortunately not able to complete the pulmonary function test.  She had only 2% eosinophils on her CBC which would be less likely to make me think about asthma.  There may be some component of interstitial lung disease on her CT and pulmonary function testing would help [place] with that as well. Principal Problem:   Dyspnea Active Problems:   End stage renal disease on dialysis ([place])   Diabetes mellitus ([place])   Essential hypertension   Hypokalemia   CAP (community acquired pneumonia)   Anemia   Elevated troponin    Plan: Continue treatments.  Continue with nebulized bronchodilators.  Add prednisone at least for now.  I will obtain another sedimentation rate before she starts the prednisone.    LOS: 1 day   Elbia Paro L 11/27/2017, 8:37 AM

## 2017-11-28 LAB — HIV ANTIBODY (ROUTINE TESTING W REFLEX): HIV Screen 4th Generation wRfx: NONREACTIVE

## 2017-11-28 LAB — BASIC METABOLIC PANEL
Anion gap: 14 (ref 5–15)
BUN: 30 mg/dL — AB (ref 6–20)
CHLORIDE: 94 mmol/L — AB (ref 101–111)
CO2: 26 mmol/L (ref 22–32)
Calcium: 8.7 mg/dL — ABNORMAL LOW (ref 8.9–10.3)
Creatinine, Ser: 7.26 mg/dL — ABNORMAL HIGH (ref 0.44–1.00)
GFR, EST AFRICAN AMERICAN: 7 mL/min — AB (ref >60)
GFR, EST NON AFRICAN AMERICAN: 6 mL/min — AB (ref >60)
Glucose, Bld: 244 mg/dL — ABNORMAL HIGH (ref 65–99)
POTASSIUM: 4.7 mmol/L (ref 3.5–5.1)
Sodium: 134 mmol/L — ABNORMAL LOW (ref 135–145)

## 2017-11-28 LAB — HEPATITIS B SURFACE ANTIGEN: Hepatitis B Surface Ag: NEGATIVE

## 2017-11-28 LAB — GLUCOSE, CAPILLARY
GLUCOSE-CAPILLARY: 206 mg/dL — AB (ref 65–99)
GLUCOSE-CAPILLARY: 309 mg/dL — AB (ref 65–99)
Glucose-Capillary: 193 mg/dL — ABNORMAL HIGH (ref 65–99)

## 2017-11-28 LAB — LEGIONELLA PNEUMOPHILA SEROGP 1 UR AG: L. PNEUMOPHILA SEROGP 1 UR AG: NEGATIVE

## 2017-11-28 LAB — CBC
HEMATOCRIT: 33.7 % — AB (ref 36.0–46.0)
HEMOGLOBIN: 10.5 g/dL — AB (ref 12.0–15.0)
MCH: 28 pg (ref 26.0–34.0)
MCHC: 31.2 g/dL (ref 30.0–36.0)
MCV: 89.9 fL (ref 78.0–100.0)
Platelets: 266 10*3/uL (ref 150–400)
RBC: 3.75 MIL/uL — ABNORMAL LOW (ref 3.87–5.11)
RDW: 14.5 % (ref 11.5–15.5)
WBC: 8.9 10*3/uL (ref 4.0–10.5)

## 2017-11-28 MED ORDER — ALBUTEROL SULFATE (2.5 MG/3ML) 0.083% IN NEBU: 2.5000 mg | INHALATION_SOLUTION | Freq: Four times a day (QID) | RESPIRATORY_TRACT | 12 refills | Status: DC | PRN

## 2017-11-28 MED ORDER — LEVOFLOXACIN 500 MG PO TABS: 500.0000 mg | ORAL_TABLET | ORAL | 0 refills | Status: AC

## 2017-11-28 MED ORDER — NIFEDIPINE ER 60 MG PO TB24: 120.0000 mg | ORAL_TABLET | Freq: Every day | ORAL | 0 refills | Status: DC

## 2017-11-28 MED ORDER — PREDNISONE 10 MG PO TABS: ORAL_TABLET | ORAL | 0 refills | Status: DC

## 2017-11-28 MED ORDER — ALBUTEROL SULFATE HFA 108 (90 BASE) MCG/ACT IN AERS: 2.0000 | INHALATION_SPRAY | Freq: Four times a day (QID) | RESPIRATORY_TRACT | 2 refills | Status: DC | PRN

## 2017-11-28 NOTE — Progress Notes (Signed)
Full note to follow. I think she does have asthma.  sher will need inhaler and possibly nebulizer at home. Some element of diastolic heart failure. She had home sleep study and has had CPAP ordered but it has not been delivered yet.

## 2017-11-28 NOTE — Progress Notes (Signed)
Patient IV removed, telemetry removed, tolerated well. Patient given discharge instructions.

## 2017-11-28 NOTE — Progress Notes (Signed)
Patient wanted to try without O2. SpO2 was 95% on RA. Patient ambulated in room and short distance in hall SpO2 maintained above 90% most of the time, but then decreased to 86% on RA. Patient rested a few minutes with SpO2 only increasing to 90% on RA. O2 1L [place] applied. SpO2 maintaining 97-99% on O2 1L [place].

## 2017-11-28 NOTE — Discharge Summary (Signed)
Physician Discharge Summary  Patricia [name] GOT:[id number] DOB: [birth year] DOA: 11/26/2017  PCP: Jake Samples, PA-C  Admit date: 11/26/2017 Discharge date: 11/28/2017  Admitted From: Home Disposition: Home  Recommendations for Outpatient Follow-up:  1. Follow up with PCP in 1-2 weeks 2. Please obtain BMP/CBC in one week 3. Follow-up with dialysis center for regularly scheduled dialysis in a.m. 4. Patient will follow up with pulmonology for outpatient pulmonary function test.  Home Health: Equipment/Devices: Oxygen at 1 L/min, nebulizer machine  Discharge Condition: Stable CODE STATUS: Full code Diet recommendation: Heart Healthy / Carb Modified  Brief/Interim Summary: 46 year old female with a history of end-stage renal disease on hematology who presented with shortness of breath and found to have community acquired pneumonia.  On intravenous antibiotics.  Discharge Diagnoses:  Principal Problem:   Dyspnea Active Problems:   End stage renal disease on dialysis ([place])   Diabetes mellitus ([place])   Essential hypertension   Hypokalemia   CAP (community acquired pneumonia)   Anemia   Elevated troponin  1. Community-acquired pneumonia.    Treated with intravenous antibiotics.    Discussed with Dr. Luan Pulling who feels that the patient likely has moderate persistent asthma.  She was started on a course of prednisone and has also improved.  She will undergo outpatient pulmonary function tests. Will continue antibiotics and bronchodilators as an outpatient.  Continue on prednisone taper.  Continue pulmonary hygiene.  Appreciate pulmonology assistance.  Patient's oxygen was removed and maintained good saturations on room air while at rest.  While she ambulated, she did desaturate into the 80s.  This improved with 1 L of oxygen.  We will plan on discharge home with supplemental oxygen which can be reevaluated 2. End-stage renal disease on hemodialysis.  Nephrology followed patient for  dialysis needs 3. Elevated troponin.  Echocardiogram shows normal EF with no wall motion abnormalities.  Grade 2 diastolic dysfunction.  Troponins have been flat at 0.05.  Does not appear to be due to ACS. 4. Diabetes.  Continue on glipizide.  Continue sliding scale insulin.  Blood sugar stable 5. Hypertension.  Continue on labetalol, nifedipine and furosemide.  Blood pressure stable 6. GERD.  Continue Protonix     Discharge Instructions  Discharge Instructions    Diet - low sodium heart healthy   Complete by:  As directed    Increase activity slowly   Complete by:  As directed      Allergies as of 11/28/2017      Reactions   Reglan [metoclopramide] Other (See Comments)   hallucinations hallucinations   Amlodipine Besylate Rash, Other (See Comments)   dizziness      Medication List    STOP taking these medications   amoxicillin-clavulanate 500-125 MG tablet Commonly known as:  AUGMENTIN   azithromycin 500 MG tablet Commonly known as:  ZITHROMAX   cloNIDine 0.2 MG tablet Commonly known as:  CATAPRES     TAKE these medications   acetaminophen 500 MG tablet Commonly known as:  TYLENOL Take 500 mg by mouth 3 (three) times daily as needed for moderate pain.   albuterol (2.5 MG/3ML) 0.083% nebulizer solution Commonly known as:  PROVENTIL Take 3 mLs (2.5 mg total) by nebulization every 6 (six) hours as needed for wheezing or shortness of breath.   albuterol 108 (90 Base) MCG/ACT inhaler Commonly known as:  PROVENTIL HFA;VENTOLIN HFA Inhale 2 puffs into the lungs every 6 (six) hours as needed for wheezing or shortness of breath.   AMITIZA 24 MCG capsule Generic  drug:  lubiprostone TAKE 1 CAPSULE (24 MCG TOTAL) BY MOUTH 2 (TWO) TIMES DAILY WITH A MEAL.   AURYXIA 1 GM 210 MG(Fe) tablet Generic drug:  ferric citrate Take 2 tablets by mouth 2 (two) times daily with a meal.   chlorhexidine 0.12 % solution Commonly known as:  PERIDEX Swish and spit 15 mLs 2 (two)  times daily.   fluticasone 50 MCG/ACT nasal spray Commonly known as:  FLONASE Place 2 sprays into both nostrils daily. LEFT NARE ONLY   furosemide 40 MG tablet Commonly known as:  LASIX Take 40-80 mg by mouth 2 (two) times daily. 80mg  in the morning and 40mg  at bedtime   glipiZIDE 5 MG tablet Commonly known as:  GLUCOTROL Take 5 mg by mouth 2 (two) times daily as needed. For blood sugar levels over 150   guaiFENesin 600 MG 12 hr tablet Commonly known as:  MUCINEX Take 1,200 mg by mouth 2 (two) times daily.   labetalol 200 MG tablet Commonly known as:  NORMODYNE Take 200 mg by mouth 2 (two) times daily.   levofloxacin 500 MG tablet Commonly known as:  LEVAQUIN Take 1 tablet (500 mg total) by mouth every other day for 3 doses.   multivitamin Tabs tablet Take 1 tablet by mouth daily.   NIFEdipine 60 MG 24 hr tablet Commonly known as:  PROCARDIA-XL/ADALAT CC Take 2 tablets (120 mg total) by mouth daily.   pantoprazole 20 MG tablet Commonly known as:  PROTONIX Take 20 mg by mouth daily.   predniSONE 10 MG tablet Commonly known as:  DELTASONE Take 40mg  po daily for 2 days then 30mg  daily for 2 days then 20mg  daily for 2 days then 10mg  daily for 2 days then stop   RENVELA 800 MG tablet Generic drug:  sevelamer carbonate Take 1,600 mg by mouth 3 (three) times daily with meals.   rOPINIRole 2 MG 24 hr tablet Commonly known as:  REQUIP XL Take 2 mg by mouth at bedtime.   SENSIPAR 60 MG tablet Generic drug:  cinacalcet Take 60 mg by mouth every evening.            Durable Medical Equipment  (From admission, onward)        Start     Ordered   11/28/17 1623  For home use only DME oxygen  Once    Question Answer Comment  Mode or (Route) Nasal cannula   Liters per Minute 1   Frequency Continuous (stationary and portable oxygen unit needed)   Oxygen conserving device Yes   Oxygen delivery system Gas      11/28/17 1622   11/28/17 1602  For home use only DME  Nebulizer machine  Once    Question:  Patient needs a nebulizer to treat with the following condition  Answer:  Moderate persistent asthma   11/28/17 1604      Allergies  Allergen Reactions  . Reglan [Metoclopramide] Other (See Comments)    hallucinations hallucinations  . Amlodipine Besylate Rash and Other (See Comments)    dizziness    Consultations:  Nephrology  Pulmonology   Procedures/Studies: Dg Chest 2 View  Result Date: 11/26/2017 CLINICAL DATA:  Shortness of breath.  Chronic renal failure EXAM: CHEST  2 VIEW COMPARISON:  November 18, 2017 FINDINGS: There is mild interstitial pulmonary edema, slightly more on the left than on the right. There is a minimal left pleural effusion. There is no airspace consolidation. There is cardiomegaly with mild pulmonary venous hypertension. No adenopathy.  No bone lesions. There are surgical clips overlying the left breast. IMPRESSION: Pulmonary vascular congestion with mild interstitial edema. No consolidation. Minimal left pleural effusion. Electronically Signed   By: Lowella Grip III M.D.   On: 11/26/2017 13:06   Dg Chest 2 View  Result Date: 11/18/2017 CLINICAL DATA:  Recent pneumonia with cough and shortness of breath. Breast cancer. EXAM: CHEST  2 VIEW COMPARISON:  09/25/2017. FINDINGS: The heart size and mediastinal contours are within normal limits. Both lungs are clear. The visualized skeletal structures are unremarkable. LEFT mastectomy. LEFT-sided surgical clips. No change from priors. IMPRESSION: LEFT mastectomy. No active disease. No findings to suggest consolidation or pulmonary nodule. Electronically Signed   By: Staci Righter M.D.   On: 11/18/2017 15:13   Ct Chest Wo Contrast  Result Date: 11/26/2017 CLINICAL DATA:  Shortness of breath developed during today's dialysis, coughing EXAM: CT CHEST WITHOUT CONTRAST TECHNIQUE: Multidetector CT imaging of the chest was performed following the standard protocol without IV  contrast. COMPARISON:  Chest x-ray of 11/26/2017 FINDINGS: Cardiovascular: The heart is mildly enlarged. No pericardial effusion is seen. No significant coronary artery calcifications are evident. The mid ascending thoracic aorta measures 30 mm in diameter pre Mediastinum/Nodes: On this unenhanced study there are slightly prominent precarinal nodes with the largest measuring 14 mm in diameter most likely inflammatory or infectious in etiology. A prominent thyroid gland is noted. Lungs/Pleura: On lung window images, there is mosaic appearance of the lungs which may indicate small airway disease. Also there are slightly prominent interstitial markings particular at the lung bases which may indicate mild interstitial edema. Also the lung bases there are opacities posteriorly and inferiorly which do not have the typical appearance of atelectasis and may represent areas of developing pneumonia. The central airway is patent. Upper Abdomen: What is seen of the upper abdomen is unremarkable with the kidneys being somewhat small. Musculoskeletal: The thoracic vertebrae are normal alignment with degenerative change in the mid to lower thoracic spine. No compression deformity is seen. 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 There is fluid in the left mastectomy bed which may represent residual hematoma or seroma and clinical correlation is recommended. IMPRESSION: 1. Mosaic appearance throughout the lungs suggests small airways disease. 2. Also there are somewhat prominent interstitial markings particularly at the lung bases most consistent with interstitial edema. 3. Opacities posteriorly in both lung bases do not appear typical of atelectasis and may represent developing pneumonia. Consider followup. 4. Fluid in the mid left mastectomy bed, seroma or hematoma. Correlate clinically. 5. Prominent thyroid gland. Electronically Signed   By: Ivar Drape M.D.   On: 11/26/2017 14:21    Echo: - Left ventricle: The cavity size  was normal. Systolic function was   normal. The estimated ejection fraction was in the range of 60%   to 65%. Wall motion was normal; there were no regional wall   motion abnormalities. Features are consistent with a pseudonormal   left ventricular filling pattern, with concomitant abnormal   relaxation and increased filling pressure (grade 2 diastolic   dysfunction). Doppler parameters are consistent with high   ventricular filling pressure. Moderate septal and severe   posterior wall thickening. - Mitral valve: Calcified annulus. - Left atrium: The atrium was moderately to severely dilated.   Subjective: Feeling better today. Shortness of breath is better today. Non productive cough present  Discharge Exam: Vitals:   11/28/17 1314 11/28/17 1443  BP: (!) 153/64   Pulse: 86   Resp: 16  Temp: 98.2 F (36.8 C)   SpO2: 94% 94%   Vitals:   11/28/17 0645 11/28/17 0751 11/28/17 1314 11/28/17 1443  BP:   (!) 153/64   Pulse:   86   Resp:   16   Temp:   98.2 F (36.8 C)   TempSrc:   Oral   SpO2: 98% 94% 94% 94%  Weight:      Height:        General: Pt is alert, awake, not in acute distress Cardiovascular: RRR, S1/S2 +, no rubs, no gallops Respiratory: CTA bilaterally, no wheezing, no rhonchi Abdominal: Soft, NT, ND, bowel sounds + Extremities: no edema, no cyanosis    The results of significant diagnostics from this hospitalization (including imaging, microbiology, ancillary and laboratory) are listed below for reference.     Microbiology: Recent Results (from the past 240 hour(s))  Respiratory Panel by PCR     Status: None   Collection Time: 11/26/17  4:41 PM  Result Value Ref Range Status   Adenovirus NOT DETECTED NOT DETECTED Final   Coronavirus 229E NOT DETECTED NOT DETECTED Final   Coronavirus HKU1 NOT DETECTED NOT DETECTED Final   Coronavirus NL63 NOT DETECTED NOT DETECTED Final   Coronavirus OC43 NOT DETECTED NOT DETECTED Final   Metapneumovirus NOT DETECTED  NOT DETECTED Final   Rhinovirus / Enterovirus NOT DETECTED NOT DETECTED Final   Influenza A NOT DETECTED NOT DETECTED Final   Influenza B NOT DETECTED NOT DETECTED Final   Parainfluenza Virus 1 NOT DETECTED NOT DETECTED Final   Parainfluenza Virus 2 NOT DETECTED NOT DETECTED Final   Parainfluenza Virus 3 NOT DETECTED NOT DETECTED Final   Parainfluenza Virus 4 NOT DETECTED NOT DETECTED Final   Respiratory Syncytial Virus NOT DETECTED NOT DETECTED Final   Bordetella pertussis NOT DETECTED NOT DETECTED Final   Chlamydophila pneumoniae NOT DETECTED NOT DETECTED Final   Mycoplasma pneumoniae NOT DETECTED NOT DETECTED Final    Comment: Performed at [facility], [place] [address] [place], [place] [address]  Culture, blood (routine x 2) Call MD if unable to obtain prior to antibiotics being given     Status: None (Preliminary result)   Collection Time: 11/26/17  6:02 PM  Result Value Ref Range Status   Specimen Description RIGHT ANTECUBITAL  Final   Special Requests   Final    BOTTLES DRAWN AEROBIC AND ANAEROBIC Blood Culture results may not be optimal due to an inadequate volume of blood received in culture bottles   Culture NO GROWTH 2 DAYS  Final   Report Status PENDING  Incomplete  Culture, blood (routine x 2) Call MD if unable to obtain prior to antibiotics being given     Status: None (Preliminary result)   Collection Time: 11/26/17  6:02 PM  Result Value Ref Range Status   Specimen Description BLOOD RIGHT FOREARM  Final   Special Requests   Final    BOTTLES DRAWN AEROBIC AND ANAEROBIC Blood Culture results may not be optimal due to an inadequate volume of blood received in culture bottles   Culture NO GROWTH 2 DAYS  Final   Report Status PENDING  Incomplete     Labs: BNP (last 3 results) Recent Labs    11/26/17 1217  BNP 841.3*   Basic Metabolic Panel: Recent Labs  Lab 11/26/17 1217 11/27/17 0409 11/28/17 0335  NA 138 138 134*  K 3.3* 4.3 4.7  CL 98* 99* 94*  CO2 25  26 26   GLUCOSE 92 160*  244*  BUN 28* 38* 30*  CREATININE 6.90* 8.92* 7.26*  CALCIUM 9.1 8.4* 8.7*   Liver Function Tests: Recent Labs  Lab 11/26/17 1217 11/27/17 0409  AST 22 18  ALT 23 25  ALKPHOS 117 109  BILITOT 1.2 0.9  PROT 8.3* 7.4  ALBUMIN 4.3 3.8   No results for input(s): LIPASE, AMYLASE in the last 168 hours. No results for input(s): AMMONIA in the last 168 hours. CBC: Recent Labs  Lab 11/26/17 1217 11/27/17 0409 11/28/17 0335  WBC 10.9* 8.9 8.9  NEUTROABS 8.6*  --   --   HGB 10.1* 10.0* 10.5*  HCT 31.9* 32.8* 33.7*  MCV 89.6 90.4 89.9  PLT 288 268 266   Cardiac Enzymes: Recent Labs  Lab 11/26/17 1217 11/26/17 1802 11/26/17 2235 11/27/17 0409  TROPONINI 0.05* 0.05* 0.05* 0.05*   BNP: Invalid input(s): POCBNP CBG: Recent Labs  Lab 11/27/17 1623 11/27/17 2200 11/28/17 0730 11/28/17 1116 11/28/17 1619  GLUCAP 176* 151* 206* 193* 309*   D-Dimer No results for input(s): DDIMER in the last 72 hours. Hgb A1c No results for input(s): HGBA1C in the last 72 hours. Lipid Profile No results for input(s): CHOL, HDL, LDLCALC, TRIG, CHOLHDL, LDLDIRECT in the last 72 hours. Thyroid function studies No results for input(s): TSH, T4TOTAL, T3FREE, THYROIDAB in the last 72 hours.  Invalid input(s): FREET3 Anemia work up No results for input(s): VITAMINB12, FOLATE, FERRITIN, TIBC, IRON, RETICCTPCT in the last 72 hours. Urinalysis    Component Value Date/Time   COLORURINE YELLOW 09/14/2016 1915   APPEARANCEUR CLEAR 09/14/2016 1915   LABSPEC 1.015 09/14/2016 1915   PHURINE 7.5 09/14/2016 1915   GLUCOSEU >1000 (A) 09/14/2016 1915   HGBUR TRACE (A) 09/14/2016 1915   BILIRUBINUR NEGATIVE 09/14/2016 [place] NEGATIVE 09/14/2016 1915   PROTEINUR >300 (A) 09/14/2016 1915   NITRITE NEGATIVE 09/14/2016 1915   LEUKOCYTESUR NEGATIVE 09/14/2016 1915   Sepsis Labs Invalid input(s): PROCALCITONIN,  WBC,  LACTICIDVEN Microbiology Recent Results (from  the past 240 hour(s))  Respiratory Panel by PCR     Status: None   Collection Time: 11/26/17  4:41 PM  Result Value Ref Range Status   Adenovirus NOT DETECTED NOT DETECTED Final   Coronavirus 229E NOT DETECTED NOT DETECTED Final   Coronavirus HKU1 NOT DETECTED NOT DETECTED Final   Coronavirus NL63 NOT DETECTED NOT DETECTED Final   Coronavirus OC43 NOT DETECTED NOT DETECTED Final   Metapneumovirus NOT DETECTED NOT DETECTED Final   Rhinovirus / Enterovirus NOT DETECTED NOT DETECTED Final   Influenza A NOT DETECTED NOT DETECTED Final   Influenza B NOT DETECTED NOT DETECTED Final   Parainfluenza Virus 1 NOT DETECTED NOT DETECTED Final   Parainfluenza Virus 2 NOT DETECTED NOT DETECTED Final   Parainfluenza Virus 3 NOT DETECTED NOT DETECTED Final   Parainfluenza Virus 4 NOT DETECTED NOT DETECTED Final   Respiratory Syncytial Virus NOT DETECTED NOT DETECTED Final   Bordetella pertussis NOT DETECTED NOT DETECTED Final   Chlamydophila pneumoniae NOT DETECTED NOT DETECTED Final   Mycoplasma pneumoniae NOT DETECTED NOT DETECTED Final    Comment: Performed at [facility], [place] [address], [place], [place] [address]  Culture, blood (routine x 2) Call MD if unable to obtain prior to antibiotics being given     Status: None (Preliminary result)   Collection Time: 11/26/17  6:02 PM  Result Value Ref Range Status   Specimen Description RIGHT ANTECUBITAL  Final   Special Requests   Final  BOTTLES DRAWN AEROBIC AND ANAEROBIC Blood Culture results may not be optimal due to an inadequate volume of blood received in culture bottles   Culture NO GROWTH 2 DAYS  Final   Report Status PENDING  Incomplete  Culture, blood (routine x 2) Call MD if unable to obtain prior to antibiotics being given     Status: None (Preliminary result)   Collection Time: 11/26/17  6:02 PM  Result Value Ref Range Status   Specimen Description BLOOD RIGHT FOREARM  Final   Special Requests   Final    BOTTLES DRAWN AEROBIC  AND ANAEROBIC Blood Culture results may not be optimal due to an inadequate volume of blood received in culture bottles   Culture NO GROWTH 2 DAYS  Final   Report Status PENDING  Incomplete     Time coordinating discharge: Over 30 minutes  SIGNED:   Kathie Dike, MD  [facility] 11/28/2017, 5:09 PM Pager   If 7PM-7AM, please contact night-coverage [email or web address] Password [email or web address]

## 2017-11-28 NOTE — Consult Note (Signed)
Reason for Consult: End-stage renal disease Referring Physician: Dr.Mesner  Patricia [name] is an 46 y.o. female.  HPI: She is a patient who has history of hypertension, sleep apnea, breast cancer status post mastectomy and end-stage renal disease on maintenance hemodialysis presently came to emergency room with complaints of left-sided chest pain and also difficulty in breathing about half an hour before finishing dialysis.  Presently patient says that she is feeling much better.  The pain is not radiating and pressure-like.  When she was evaluated patient was found to be hypoxic and she was put on nasal oxygen with some improvement.  She has also some cough but no fever chills or sweating.  Past Medical History:  Diagnosis Date  . Blood transfusion without reported diagnosis   . Cancer ([place])   . Diabetes mellitus without complication ([place])   . Dialysis patient ([place])    mon, wed, friday,   . Hypertension   . OSA (obstructive sleep apnea)   . Renal disorder   . Renal insufficiency     Past Surgical History:  Procedure Laterality Date  . ABDOMINAL HYSTERECTOMY    . AV FISTULA PLACEMENT  11/2014   at [place] N/A 07/10/2016   Procedure: BALLOON DILATION;  Surgeon: Danie Binder, MD;  Location[facility];  Service: Endoscopy;  Laterality: N/A;  Pyloric dilation  . BREAST LUMPECTOMY    . CESAREAN SECTION    . CHOLECYSTECTOMY    . COLONOSCOPY WITH PROPOFOL N/A 09/27/2016   Procedure: COLONOSCOPY WITH PROPOFOL;  Surgeon: Daneil Dolin, MD;  Location: [facility];  Service: Endoscopy;  Laterality: N/A;  2:00  . DILATION AND CURETTAGE OF UTERUS    . ESOPHAGOGASTRODUODENOSCOPY N/A 07/10/2016   Dr.Fields- normal esophagus, gastric stenosis was found at the pylorus, gastritis on bx, normal examined duodenun  . MASTECTOMY     left sided    Family History  Problem Relation Age of Onset  . Diabetes Mellitus II Mother   . Hypertension Mother   . Hypertension  Sister   . Hypertension Sister   . Colon cancer Neg Hx     Social History:  reports that  has never smoked. she has never used smokeless tobacco. She reports that she does not drink alcohol or use drugs.  Allergies:  Allergies  Allergen Reactions  . Reglan [Metoclopramide] Other (See Comments)    hallucinations hallucinations  . Amlodipine Besylate Rash and Other (See Comments)    dizziness    Medications: I have reviewed the patient's current medications.  Results for orders placed or performed during the hospital encounter of 11/26/17 (from the past 48 hour(s))  Comprehensive metabolic panel     Status: Abnormal   Collection Time: 11/26/17 12:17 PM  Result Value Ref Range   Sodium 138 135 - 145 mmol/L   Potassium 3.3 (L) 3.5 - 5.1 mmol/L   Chloride 98 (L) 101 - 111 mmol/L   CO2 25 22 - 32 mmol/L   Glucose, Bld 92 65 - 99 mg/dL   BUN 28 (H) 6 - 20 mg/dL   Creatinine, Ser 6.90 (H) 0.44 - 1.00 mg/dL   Calcium 9.1 8.9 - 10.3 mg/dL   Total Protein 8.3 (H) 6.5 - 8.1 g/dL   Albumin 4.3 3.5 - 5.0 g/dL   AST 22 15 - 41 U/L   ALT 23 14 - 54 U/L   Alkaline Phosphatase 117 38 - 126 U/L   Total Bilirubin 1.2 0.3 - 1.2  mg/dL   GFR calc non Af Amer 6 (L) >60 mL/min   GFR calc Af Amer 7 (L) >60 mL/min    Comment: (NOTE) The eGFR has been calculated using the CKD EPI equation. This calculation has not been validated in all clinical situations. eGFR's persistently <60 mL/min signify possible Chronic Kidney Disease.    Anion gap 15 5 - 15  CBC WITH DIFFERENTIAL     Status: Abnormal   Collection Time: 11/26/17 12:17 PM  Result Value Ref Range   WBC 10.9 (H) 4.0 - 10.5 K/uL   RBC 3.56 (L) 3.87 - 5.11 MIL/uL   Hemoglobin 10.1 (L) 12.0 - 15.0 g/dL   HCT 31.9 (L) 36.0 - 46.0 %   MCV 89.6 78.0 - 100.0 fL   MCH 28.4 26.0 - 34.0 pg   MCHC 31.7 30.0 - 36.0 g/dL   RDW 15.1 11.5 - 15.5 %   Platelets 288 150 - 400 K/uL   Neutrophils Relative % 79 %   Neutro Abs 8.6 (H) 1.7 - 7.7 K/uL    Lymphocytes Relative 17 %   Lymphs Abs 1.9 0.7 - 4.0 K/uL   Monocytes Relative 2 %   Monocytes Absolute 0.2 0.1 - 1.0 K/uL   Eosinophils Relative 2 %   Eosinophils Absolute 0.2 0.0 - 0.7 K/uL   Basophils Relative 0 %   Basophils Absolute 0.0 0.0 - 0.1 K/uL  Troponin I     Status: Abnormal   Collection Time: 11/26/17 12:17 PM  Result Value Ref Range   Troponin I 0.05 (HH) <0.03 ng/mL    Comment: CRITICAL RESULT CALLED TO, READ BACK BY AND VERIFIED WITH: WILLEY,E AT 1310 ON 12.11.2018 BY ISLEY,B   Brain natriuretic peptide     Status: Abnormal   Collection Time: 11/26/17 12:17 PM  Result Value Ref Range   B Natriuretic Peptide 270.0 (H) 0.0 - 100.0 pg/mL  CBG monitoring, ED     Status: Abnormal   Collection Time: 11/26/17  3:54 PM  Result Value Ref Range   Glucose-Capillary 113 (H) 65 - 99 mg/dL  Strep pneumoniae urinary antigen     Status: None   Collection Time: 11/26/17  4:41 PM  Result Value Ref Range   Strep Pneumo Urinary Antigen NEGATIVE NEGATIVE    Comment:        Infection due to S. pneumoniae cannot be absolutely ruled out since the antigen present may be below the detection limit of the test. Performed at [facility], [address], [place], [place] [address]   Respiratory Panel by PCR     Status: None   Collection Time: 11/26/17  4:41 PM  Result Value Ref Range   Adenovirus NOT DETECTED NOT DETECTED   Coronavirus 229E NOT DETECTED NOT DETECTED   Coronavirus HKU1 NOT DETECTED NOT DETECTED   Coronavirus NL63 NOT DETECTED NOT DETECTED   Coronavirus OC43 NOT DETECTED NOT DETECTED   Metapneumovirus NOT DETECTED NOT DETECTED   Rhinovirus / Enterovirus NOT DETECTED NOT DETECTED   Influenza A NOT DETECTED NOT DETECTED   Influenza B NOT DETECTED NOT DETECTED   Parainfluenza Virus 1 NOT DETECTED NOT DETECTED   Parainfluenza Virus 2 NOT DETECTED NOT DETECTED   Parainfluenza Virus 3 NOT DETECTED NOT DETECTED   Parainfluenza Virus 4 NOT DETECTED NOT DETECTED    Respiratory Syncytial Virus NOT DETECTED NOT DETECTED   Bordetella pertussis NOT DETECTED NOT DETECTED   Chlamydophila pneumoniae NOT DETECTED NOT DETECTED   Mycoplasma pneumoniae NOT DETECTED  NOT DETECTED    Comment: Performed at [facility], [place] [address] [place], [place] [address]  Glucose, capillary     Status: Abnormal   Collection Time: 11/26/17  4:51 PM  Result Value Ref Range   Glucose-Capillary 101 (H) 65 - 99 mg/dL   Comment 1 Notify RN    Comment 2 Document in Chart   Hepatitis B surface antigen     Status: None   Collection Time: 11/26/17  5:47 PM  Result Value Ref Range   Hepatitis B Surface Ag Negative Negative    Comment: (NOTE) Performed At: [facility] [place], [place] [id number] Rush Farmer MD [id number]   Troponin I (q 6hr x 3)     Status: Abnormal   Collection Time: 11/26/17  6:02 PM  Result Value Ref Range   Troponin I 0.05 (HH) <0.03 ng/mL    Comment: CRITICAL VALUE NOTED.  VALUE IS CONSISTENT WITH PREVIOUSLY REPORTED AND CALLED VALUE.  HIV antibody     Status: None   Collection Time: 11/26/17  6:02 PM  Result Value Ref Range   HIV Screen 4th Generation wRfx Non Reactive Non Reactive    Comment: (NOTE) Performed At: [facility] [place], [place] [id number] Rush Farmer MD [id number]   Culture, blood (routine x 2) Call MD if unable to obtain prior to antibiotics being given     Status: None (Preliminary result)   Collection Time: 11/26/17  6:02 PM  Result Value Ref Range   Specimen Description RIGHT ANTECUBITAL    Special Requests      BOTTLES DRAWN AEROBIC AND ANAEROBIC Blood Culture results may not be optimal due to an inadequate volume of blood received in culture bottles   Culture NO GROWTH 2 DAYS    Report Status PENDING   Culture, blood (routine x 2) Call MD if unable to obtain prior to antibiotics being given     Status: None (Preliminary result)   Collection Time: 11/26/17  6:02  PM  Result Value Ref Range   Specimen Description BLOOD RIGHT FOREARM    Special Requests      BOTTLES DRAWN AEROBIC AND ANAEROBIC Blood Culture results may not be optimal due to an inadequate volume of blood received in culture bottles   Culture NO GROWTH 2 DAYS    Report Status PENDING   Glucose, capillary     Status: Abnormal   Collection Time: 11/26/17  9:13 PM  Result Value Ref Range   Glucose-Capillary 213 (H) 65 - 99 mg/dL   Comment 1 Notify RN    Comment 2 Document in Chart   Troponin I (q 6hr x 3)     Status: Abnormal   Collection Time: 11/26/17 10:35 PM  Result Value Ref Range   Troponin I 0.05 (HH) <0.03 ng/mL    Comment: CRITICAL VALUE NOTED.  VALUE IS CONSISTENT WITH PREVIOUSLY REPORTED AND CALLED VALUE.  Troponin I (q 6hr x 3)     Status: Abnormal   Collection Time: 11/27/17  4:09 AM  Result Value Ref Range   Troponin I 0.05 (HH) <0.03 ng/mL    Comment: CRITICAL VALUE NOTED.  VALUE IS CONSISTENT WITH PREVIOUSLY REPORTED AND CALLED VALUE.  CBC     Status: Abnormal   Collection Time: 11/27/17  4:09 AM  Result Value Ref Range   WBC 8.9 4.0 - 10.5 K/uL   RBC 3.63 (L) 3.87 - 5.11 MIL/uL   Hemoglobin 10.0 (L) 12.0 - 15.0  g/dL   HCT 32.8 (L) 36.0 - 46.0 %   MCV 90.4 78.0 - 100.0 fL   MCH 27.5 26.0 - 34.0 pg   MCHC 30.5 30.0 - 36.0 g/dL   RDW 15.0 11.5 - 15.5 %   Platelets 268 150 - 400 K/uL  Comprehensive metabolic panel     Status: Abnormal   Collection Time: 11/27/17  4:09 AM  Result Value Ref Range   Sodium 138 135 - 145 mmol/L   Potassium 4.3 3.5 - 5.1 mmol/L    Comment: DELTA CHECK NOTED   Chloride 99 (L) 101 - 111 mmol/L   CO2 26 22 - 32 mmol/L   Glucose, Bld 160 (H) 65 - 99 mg/dL   BUN 38 (H) 6 - 20 mg/dL   Creatinine, Ser 8.92 (H) 0.44 - 1.00 mg/dL   Calcium 8.4 (L) 8.9 - 10.3 mg/dL   Total Protein 7.4 6.5 - 8.1 g/dL   Albumin 3.8 3.5 - 5.0 g/dL   AST 18 15 - 41 U/L   ALT 25 14 - 54 U/L   Alkaline Phosphatase 109 38 - 126 U/L   Total Bilirubin 0.9  0.3 - 1.2 mg/dL   GFR calc non Af Amer 5 (L) >60 mL/min   GFR calc Af Amer 5 (L) >60 mL/min    Comment: (NOTE) The eGFR has been calculated using the CKD EPI equation. This calculation has not been validated in all clinical situations. eGFR's persistently <60 mL/min signify possible Chronic Kidney Disease.    Anion gap 13 5 - 15  Sedimentation rate     Status: Abnormal   Collection Time: 11/27/17  4:09 AM  Result Value Ref Range   Sed Rate 35 (H) 0 - 22 mm/hr  Glucose, capillary     Status: Abnormal   Collection Time: 11/27/17  7:28 AM  Result Value Ref Range   Glucose-Capillary 123 (H) 65 - 99 mg/dL  Glucose, capillary     Status: Abnormal   Collection Time: 11/27/17 11:16 AM  Result Value Ref Range   Glucose-Capillary 194 (H) 65 - 99 mg/dL  Glucose, capillary     Status: Abnormal   Collection Time: 11/27/17  4:23 PM  Result Value Ref Range   Glucose-Capillary 176 (H) 65 - 99 mg/dL  Glucose, capillary     Status: Abnormal   Collection Time: 11/27/17 10:00 PM  Result Value Ref Range   Glucose-Capillary 151 (H) 65 - 99 mg/dL   Comment 1 Notify RN    Comment 2 Document in Chart   CBC     Status: Abnormal   Collection Time: 11/28/17  3:35 AM  Result Value Ref Range   WBC 8.9 4.0 - 10.5 K/uL   RBC 3.75 (L) 3.87 - 5.11 MIL/uL   Hemoglobin 10.5 (L) 12.0 - 15.0 g/dL   HCT 33.7 (L) 36.0 - 46.0 %   MCV 89.9 78.0 - 100.0 fL   MCH 28.0 26.0 - 34.0 pg   MCHC 31.2 30.0 - 36.0 g/dL   RDW 14.5 11.5 - 15.5 %   Platelets 266 150 - 400 K/uL  Basic metabolic panel     Status: Abnormal   Collection Time: 11/28/17  3:35 AM  Result Value Ref Range   Sodium 134 (L) 135 - 145 mmol/L   Potassium 4.7 3.5 - 5.1 mmol/L   Chloride 94 (L) 101 - 111 mmol/L   CO2 26 22 - 32 mmol/L   Glucose, Bld 244 (H) 65 - 99 mg/dL  BUN 30 (H) 6 - 20 mg/dL   Creatinine, Ser 7.26 (H) 0.44 - 1.00 mg/dL   Calcium 8.7 (L) 8.9 - 10.3 mg/dL   GFR calc non Af Amer 6 (L) >60 mL/min   GFR calc Af Amer 7 (L) >60  mL/min    Comment: (NOTE) The eGFR has been calculated using the CKD EPI equation. This calculation has not been validated in all clinical situations. eGFR's persistently <60 mL/min signify possible Chronic Kidney Disease.    Anion gap 14 5 - 15  Glucose, capillary     Status: Abnormal   Collection Time: 11/28/17  7:30 AM  Result Value Ref Range   Glucose-Capillary 206 (H) 65 - 99 mg/dL    Dg Chest 2 View  Result Date: 11/26/2017 CLINICAL DATA:  Shortness of breath.  Chronic renal failure EXAM: CHEST  2 VIEW COMPARISON:  November 18, 2017 FINDINGS: There is mild interstitial pulmonary edema, slightly more on the left than on the right. There is a minimal left pleural effusion. There is no airspace consolidation. There is cardiomegaly with mild pulmonary venous hypertension. No adenopathy. No bone lesions. There are surgical clips overlying the left breast. IMPRESSION: Pulmonary vascular congestion with mild interstitial edema. No consolidation. Minimal left pleural effusion. Electronically Signed   By: Lowella Grip III M.D.   On: 11/26/2017 13:06   Ct Chest Wo Contrast  Result Date: 11/26/2017 CLINICAL DATA:  Shortness of breath developed during today's dialysis, coughing EXAM: CT CHEST WITHOUT CONTRAST TECHNIQUE: Multidetector CT imaging of the chest was performed following the standard protocol without IV contrast. COMPARISON:  Chest x-ray of 11/26/2017 FINDINGS: Cardiovascular: The heart is mildly enlarged. No pericardial effusion is seen. No significant coronary artery calcifications are evident. The mid ascending thoracic aorta measures 30 mm in diameter pre Mediastinum/Nodes: On this unenhanced study there are slightly prominent precarinal nodes with the largest measuring 14 mm in diameter most likely inflammatory or infectious in etiology. A prominent thyroid gland is noted. Lungs/Pleura: On lung window images, there is mosaic appearance of the lungs which may indicate small airway  disease. Also there are slightly prominent interstitial markings particular at the lung bases which may indicate mild interstitial edema. Also the lung bases there are opacities posteriorly and inferiorly which do not have the typical appearance of atelectasis and may represent areas of developing pneumonia. The central airway is patent. Upper Abdomen: What is seen of the upper abdomen is unremarkable with the kidneys being somewhat small. Musculoskeletal: The thoracic vertebrae are normal alignment with degenerative change in the mid to lower thoracic spine. No compression deformity is seen. 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 There is fluid in the left mastectomy bed which may represent residual hematoma or seroma and clinical correlation is recommended. IMPRESSION: 1. Mosaic appearance throughout the lungs suggests small airways disease. 2. Also there are somewhat prominent interstitial markings particularly at the lung bases most consistent with interstitial edema. 3. Opacities posteriorly in both lung bases do not appear typical of atelectasis and may represent developing pneumonia. Consider followup. 4. Fluid in the mid left mastectomy bed, seroma or hematoma. Correlate clinically. 5. Prominent thyroid gland. Electronically Signed   By: Ivar Drape M.D.   On: 11/26/2017 14:21    Review of Systems  Constitutional: Negative for chills and fever.  HENT: Positive for congestion.   Respiratory: Positive for cough, sputum production and shortness of breath.   Cardiovascular: Positive for chest pain and orthopnea.  Gastrointestinal: Negative for nausea and  vomiting.   Blood pressure (!) 148/81, pulse 93, temperature 98.2 F (36.8 C), temperature source Oral, resp. rate 18, height 5' 3"  (1.6 m), weight 111.4 kg (245 lb 9.6 oz), SpO2 94 %. Physical Exam  Constitutional: No distress.  Eyes: No scleral icterus.  Neck: No JVD present.  Cardiovascular: Normal rate.  Respiratory: No respiratory  distress. She has wheezes. She has no rales.  GI: She exhibits no distension. There is no tenderness.  Musculoskeletal: She exhibits no edema.    Assessment/Plan: 1] difficulty in breathing: Possibly a combination of sleep apnea/fluid overload.  Presently possibility of pneumonia cannot be ruled out.  Patient was supposed to have pulmonary function test today but unable to do because of her difficulty in breathing.  Since possibility of fluid overload was entertained and patient was sent to him dialysis unit with some fluid removal.  Initially she improved but before she finishes started having chest pain hence dialysis was discontinued and sent to the emergency room.  Patient was seen in emergency room. 2] end-stage renal disease as stated above patient has received her dialysis today before she came to the emergency room. 3] anemia: Her hemoglobin is within her target goal 4] hypertension: Her blood pressure is reasonably controlled 5] history of sleep apnea 6] possible community-acquired pneumonia 7] diabetes Plan: Patient does not require dialysis today 2] will make arrangement for patient to get dialysis tomorrow 3] we will check a renal panel and CBC in the morning.  Kunal Levario S 11/28/2017, 9:13 AM

## 2017-11-28 NOTE — Care Management (Signed)
Orders faxed to [facility] for oxygen and neb machine. Will be delivered after 5 30 pm. Patient and RN aware.

## 2017-11-28 NOTE — Care Management (Signed)
Patient Information   Patient Name Patricia [name], Patricia [name] ([id number]) Sex Female DOB [birth year]  Room Bed  A302 A302-01  Patient Demographics   Address [place] [address] Phone [phone] (Home) [phone] (Mobile) E-mail Address [email or web address]  Patient Ethnicity & Race   Ethnic Group Patient Race  Not Hispanic or Latino Black or African American  Emergency Contact(s)   Name Relation Home Work Mobile  Patricia [name] Mother [phone]    Documents on File    Status Date Received Description  Documents for the Patient  [place] Not Received    [place] E-Signature HIPAA Notice of Privacy Received 10/62/69   Driver's License Not Received    Insurance Card Received 03/09/16 [place] Directives/Living Will/HCPOA/POA Not Received    Insurance Card Not Received    Driver's License Not Received    Other Photo ID Not Received    Insurance Card Received 12/04/15 Medicare  Insurance Card Received 08/02/16 MEDICARE/[employer]  Release of Information Received 08/02/16 RGA  HIM ROI Authorization  08/02/16   HIM ROI Authorization  10/26/16   HIM ROI Authorization  10/31/16 [employer]  HIM ROI Authorization  01/31/17 [employer]  AMB Provider Completed Forms  03/01/17 PRIOR AUTHORIZATION REQUEST [facility]  HIM ROI Authorization  03/05/17   Release of Information  04/09/17   HIM ROI Authorization  10/07/17 EM PA  HIM ROI Authorization  11/13/17 [place] DDS  HIM ROI Authorization  11/15/17   HIM Release of Information Output  11/13/17 History and Physical, Operative and Procedural Report, Discharge Summary, Consultation Report  HIM Release of Information Output  11/15/17 Orders/Results Individual, Procedure Individual, Operative and Procedural Report, Progress Note  Documents for the Encounter  AOB (Assignment of Insurance Benefits) Not Received    E-signature AOB Signed 11/26/17   MEDICARE RIGHTS Not  Received    E-signature Medicare Rights Signed 11/26/17   ED Patient Billing Extract   ED PB Billing Extract  Cardiac Monitoring Strip Received 11/26/17   Cardiac Monitoring Strip Shift Summary Received 11/26/17   Admission Information   Attending Provider Admitting Provider Admission Type Admission Date/Time  Kathie Dike, MD Jani Gravel, MD Emergency 11/26/17 1150  Discharge Date Hospital Service Auth/Cert Status Service Area   Internal Medicine Incomplete [place]  Unit Room/Bed Admission Status   AP-DEPT 300 A302/A302-01 Admission (Confirmed)   Admission   Complaint  ---  Hospital Account   Name Acct ID Class Status Primary [place], Jaleeyah [id number] Inpatient Open MEDICARE - MEDICARE PART A AND B      Guarantor Account (for [place] [id number])   Name Relation to Pt Service Area Active? Acct Type  Scali, Kehlani Self [facility] Yes Personal/Family  Address Phone    [address] [place], [place] [address] [phone])        Coverage Information (for Hospital Account [id number])   1. [employer] PART A AND B   F/O Payor/Plan Precert #  MEDICARE/MEDICARE PART A AND B   Subscriber Subscriber #  Tannia, Contino [id number]  Address Phone  [address] [place], [place] [address]   2. [place] [employer]   F/O Payor/Plan Precert #  [place] Subscriber #  Kenzly, Rogoff [id number]  Address Phone  [address] [place], [place] [address]

## 2017-11-28 NOTE — Progress Notes (Signed)
SATURATION QUALIFICATIONS: (This note is used to comply with regulatory documentation for home oxygen)  Patient Saturations on Room Air at Rest = 95%  Patient Saturations on Room Air while Ambulating 80%  Patient Saturations on 1 Liters of oxygen while Ambulating 92  %

## 2017-11-28 NOTE — Progress Notes (Signed)
Subjective: Interval History: has no complaint of nausea or vomiting.  Her breathing is much better.  She has occasional cough but no sputum production..  Objective: Vital signs in last 24 hours: Temp:  [98.2 F (36.8 C)-99.3 F (37.4 C)] 98.2 F (36.8 C) (12/13 0640) Pulse Rate:  [65-93] 93 (12/13 0640) Resp:  [18-20] 18 (12/13 0640) BP: (128-191)/(65-89) 148/81 (12/13 0640) SpO2:  [89 %-100 %] 94 % (12/13 0751) Weight:  [111.4 kg (245 lb 9.6 oz)-112 kg (246 lb 14.6 oz)] 111.4 kg (245 lb 9.6 oz) (12/13 0640) Weight change: 0.869 kg (1 lb 14.6 oz)  Intake/Output from previous day: 12/12 0701 - 12/13 0700 In: 1083 [P.O.:780; I.V.:3; IV Piggyback:300] Out: 1370 [Urine:70] Intake/Output this shift: No intake/output data recorded.  General appearance: alert, cooperative and no distress Resp: diminished breath sounds bilaterally Cardio: regular rate and rhythm Extremities: No edema  Lab Results: Recent Labs    11/27/17 0409 11/28/17 0335  WBC 8.9 8.9  HGB 10.0* 10.5*  HCT 32.8* 33.7*  PLT 268 266   BMET:  Recent Labs    11/27/17 0409 11/28/17 0335  NA 138 134*  K 4.3 4.7  CL 99* 94*  CO2 26 26  GLUCOSE 160* 244*  BUN 38* 30*  CREATININE 8.92* 7.26*  CALCIUM 8.4* 8.7*   No results for input(s): PTH in the last 72 hours. Iron Studies: No results for input(s): IRON, TIBC, TRANSFERRIN, FERRITIN in the last 72 hours.  Studies/Results: Dg Chest 2 View  Result Date: 11/26/2017 CLINICAL DATA:  Shortness of breath.  Chronic renal failure EXAM: CHEST  2 VIEW COMPARISON:  November 18, 2017 FINDINGS: There is mild interstitial pulmonary edema, slightly more on the left than on the right. There is a minimal left pleural effusion. There is no airspace consolidation. There is cardiomegaly with mild pulmonary venous hypertension. No adenopathy. No bone lesions. There are surgical clips overlying the left breast. IMPRESSION: Pulmonary vascular congestion with mild interstitial  edema. No consolidation. Minimal left pleural effusion. Electronically Signed   By: Lowella Grip III M.D.   On: 11/26/2017 13:06   Ct Chest Wo Contrast  Result Date: 11/26/2017 CLINICAL DATA:  Shortness of breath developed during today's dialysis, coughing EXAM: CT CHEST WITHOUT CONTRAST TECHNIQUE: Multidetector CT imaging of the chest was performed following the standard protocol without IV contrast. COMPARISON:  Chest x-ray of 11/26/2017 FINDINGS: Cardiovascular: The heart is mildly enlarged. No pericardial effusion is seen. No significant coronary artery calcifications are evident. The mid ascending thoracic aorta measures 30 mm in diameter pre Mediastinum/Nodes: On this unenhanced study there are slightly prominent precarinal nodes with the largest measuring 14 mm in diameter most likely inflammatory or infectious in etiology. A prominent thyroid gland is noted. Lungs/Pleura: On lung window images, there is mosaic appearance of the lungs which may indicate small airway disease. Also there are slightly prominent interstitial markings particular at the lung bases which may indicate mild interstitial edema. Also the lung bases there are opacities posteriorly and inferiorly which do not have the typical appearance of atelectasis and may represent areas of developing pneumonia. The central airway is patent. Upper Abdomen: What is seen of the upper abdomen is unremarkable with the kidneys being somewhat small. Musculoskeletal: The thoracic vertebrae are normal alignment with degenerative change in the mid to lower thoracic spine. No compression deformity is seen. 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 There is fluid in the left mastectomy bed which may represent residual hematoma or seroma and clinical  correlation is recommended. IMPRESSION: 1. Mosaic appearance throughout the lungs suggests small airways disease. 2. Also there are somewhat prominent interstitial markings particularly at the lung bases  most consistent with interstitial edema. 3. Opacities posteriorly in both lung bases do not appear typical of atelectasis and may represent developing pneumonia. Consider followup. 4. Fluid in the mid left mastectomy bed, seroma or hematoma. Correlate clinically. 5. Prominent thyroid gland. Electronically Signed   By: Ivar Drape M.D.   On: 11/26/2017 14:21    I have reviewed the patient's current medications.  Assessment/Plan: 1] difficulty breathing: Possibly a combination of fluid overload/pneumonia/bronchitis.  She was dialyzed yesterday with 3 L removal.  Patient presently is feeling much better. 2] end-stage renal disease: Her potassium is normal.  She did not have any uremic signs and symptoms. 3] hypertension: Her blood pressure is reasonably controlled 4] anemia: Her hemoglobin is within her target goal 5] bone and mineral disorder: Her calcium is range 6] history of breast CA: Status post mastectomy  Plan: We will make arrangement for patient to get dialysis tomorrow for 4 hours 2] we will try to remove another 6-2/8 L if systolic blood pressure remains above 90 3] we will check a renal panel and CBC in the morning.   LOS: 2 days   Patricia [name] S 11/28/2017,9:09 AM

## 2017-11-28 NOTE — Care Management Note (Signed)
Case Management Note  Patient Details  Name: Patricia [name] MRN: [id number] Date of Birth: Apr 22, 1971  Subjective/Objective:                  Adm with dyspnea, PNA. From home, ind with ADL's. ESRD on HD. Acutely on oxygen, has had home O2 in the past. Reports she has a recent sleep study, and [place] in seeking authorization for CPAP.  She has PCP, transportation and insurance with prescription coverage.   Action/Plan Anticipate DC home. CM following for needs. ? Need for Oxygen and neb machine.  Expected Discharge Date:  11/28/17               Expected Discharge Plan:  Home/Self Care  In-House Referral:     Discharge planning Services  CM Consult  Post Acute Care Choice:    Choice offered to:     DME Arranged:    DME Agency:     HH Arranged:    HH Agency:     Status of Service:  In process, will continue to follow  If discussed at [place] Length of Stay Meetings, dates discussed:    Additional Comments:  Mordche Hedglin, Chauncey Reading, RN 11/28/2017, 3:03 PM

## 2017-12-01 LAB — CULTURE, BLOOD (ROUTINE X 2)
CULTURE: NO GROWTH
Culture: NO GROWTH

## 2017-12-02 NOTE — Progress Notes (Signed)
NAME:  [name] [name]                      ACCOUNT NO.:  MEDICAL RECORD NO.:  [id number]  LOCATION:                                 FACILITY:  PHYSICIAN:  Lashawnda Hancox L. Luan Pulling, M.D.DATE OF BIRTH:  08-27-1971  DATE OF PROCEDURE:  11/28/2017 DATE OF DISCHARGE:                                PROGRESS NOTE   COMPLETION OF A PROGRESS NOTE  SUBJECTIVE:  [name] [name] says she feels okay.  We discussed her echocardiogram results, but when I talked to Dr. Roderic Palau later in the day, it was clear that she did not understand what I was telling her.  She indicated to him that she thought she had a pericardial effusion, and when I told her was that she had what appeared to be some diastolic heart failure, which may cause her to have more shortness of breath. She is coughing nonproductively.  I do think she has asthma and she will have to have pulmonary function testing later, but I do not think she could tolerate it now.  OBJECTIVE:  I have reviewed her medications.  CONSTITUTIONAL:  She is awake and alert, in no acute distress.  She is obese. CARDIOVASCULAR:  Her heart is regular with normal heart sounds.  I do not hear an S3 gallop. RESPIRATORY:  Her respiratory effort is normal.  Her lungs still show minimal wheeze. GASTROINTESTINAL:  Her abdomen is soft with no masses. SKIN:  Good skin turgor, warm and dry. NEUROLOGIC:  No focal abnormalities. PSYCHIATRIC:  She is anxious.  ASSESSMENT:  I think she does have asthma.  She will need to be treated for that.  I discussed her situation with Dr. Roderic Palau who plans to let her go home, and I think she will need to go home possibly on oxygen depending on her assessment later today on inhalers and a nebulizer.  I do however think that a significant amount of the problem is that she has volume overload despite the fact that she has been apparently been hitting her dry weight with dialysis.  She does have end-stage renal disease, on dialysis.  She has  recently had breast cancer and has had surgery for that.  She has pneumonia and she will be discharged home on antibiotics.  Plan is as above.  I think she already has a followup appointment in my office.     [name] [name] L. Luan Pulling, M.D.    ELH/MEDQ  D:  11/29/2017  T:  11/30/2017  Job:  [id number]

## 2017-12-26 ENCOUNTER — Other Ambulatory Visit (HOSPITAL_COMMUNITY): Payer: Self-pay | Admitting: Respiratory Therapy

## 2017-12-26 DIAGNOSIS — R05 Cough: Secondary | ICD-10-CM

## 2017-12-26 DIAGNOSIS — R059 Cough, unspecified: Secondary | ICD-10-CM

## 2018-01-09 ENCOUNTER — Ambulatory Visit (HOSPITAL_COMMUNITY)
Admission: RE | Admit: 2018-01-09 | Discharge: 2018-01-09 | Disposition: A | Discharge status: Home / Self Care | Payer: PRIVATE HEALTH INSURANCE | Source: Ambulatory Visit | Attending: Pulmonary Disease | Admitting: Pulmonary Disease

## 2018-01-09 DIAGNOSIS — R942 Abnormal results of pulmonary function studies: Secondary | ICD-10-CM | POA: Insufficient documentation

## 2018-01-09 DIAGNOSIS — R05 Cough: Secondary | ICD-10-CM | POA: Diagnosis present

## 2018-01-09 DIAGNOSIS — J984 Other disorders of lung: Secondary | ICD-10-CM | POA: Diagnosis not present

## 2018-01-09 DIAGNOSIS — R059 Cough, unspecified: Secondary | ICD-10-CM

## 2018-01-09 LAB — PULMONARY FUNCTION TEST
DL/VA % PRED: 75 %
DL/VA: 3.51 ml/min/mmHg/L
DLCO UNC % PRED: 55 %
DLCO UNC: 12.64 ml/min/mmHg
FEF 25-75 POST: 2.52 L/s
FEF 25-75 Pre: 2.3 L/sec
FEF2575-%Change-Post: 9 %
FEF2575-%PRED-POST: 102 %
FEF2575-%PRED-PRE: 94 %
FEV1-%Change-Post: 1 %
FEV1-%PRED-PRE: 69 %
FEV1-%Pred-Post: 70 %
FEV1-Post: 1.6 L
FEV1-Pre: 1.58 L
FEV1FVC-%Change-Post: 0 %
FEV1FVC-%PRED-PRE: 107 %
FEV6-%CHANGE-POST: 0 %
FEV6-%Pred-Post: 65 %
FEV6-%Pred-Pre: 65 %
FEV6-POST: 1.81 L
FEV6-Pre: 1.79 L
FEV6FVC-%PRED-POST: 102 %
FEV6FVC-%Pred-Pre: 102 %
FVC-%Change-Post: 0 %
FVC-%PRED-POST: 64 %
FVC-%PRED-PRE: 63 %
FVC-POST: 1.81 L
FVC-PRE: 1.79 L
PRE FEV1/FVC RATIO: 88 %
Post FEV1/FVC ratio: 89 %
Post FEV6/FVC ratio: 100 %
Pre FEV6/FVC Ratio: 100 %
RV % pred: 22 %
RV: 0.38 L
TLC % pred: 66 %
TLC: 3.26 L

## 2018-01-09 MED ORDER — ALBUTEROL SULFATE (2.5 MG/3ML) 0.083% IN NEBU
2.5000 mg | INHALATION_SOLUTION | Freq: Once | RESPIRATORY_TRACT | Status: AC
  Administered 2018-01-09: 2.5 mg via RESPIRATORY_TRACT

## 2018-01-30 DIAGNOSIS — E782 Mixed hyperlipidemia: Secondary | ICD-10-CM | POA: Diagnosis not present

## 2018-01-30 DIAGNOSIS — Z6841 Body Mass Index (BMI) 40.0 and over, adult: Secondary | ICD-10-CM | POA: Diagnosis not present

## 2018-01-30 DIAGNOSIS — M25542 Pain in joints of left hand: Secondary | ICD-10-CM | POA: Diagnosis not present

## 2018-01-30 DIAGNOSIS — N184 Chronic kidney disease, stage 4 (severe): Secondary | ICD-10-CM | POA: Diagnosis not present

## 2018-01-30 DIAGNOSIS — A084 Viral intestinal infection, unspecified: Secondary | ICD-10-CM | POA: Diagnosis not present

## 2018-01-30 DIAGNOSIS — I10 Essential (primary) hypertension: Secondary | ICD-10-CM | POA: Diagnosis not present

## 2018-01-30 DIAGNOSIS — Z1389 Encounter for screening for other disorder: Secondary | ICD-10-CM | POA: Diagnosis not present

## 2018-01-30 DIAGNOSIS — E119 Type 2 diabetes mellitus without complications: Secondary | ICD-10-CM | POA: Diagnosis not present

## 2018-02-04 ENCOUNTER — Ambulatory Visit (HOSPITAL_COMMUNITY)
Admission: RE | Admit: 2018-02-04 | Discharge: 2018-02-04 | Disposition: A | Discharge status: Home / Self Care | Payer: PRIVATE HEALTH INSURANCE | Source: Ambulatory Visit | Attending: Family Medicine | Admitting: Family Medicine

## 2018-02-04 ENCOUNTER — Other Ambulatory Visit (HOSPITAL_COMMUNITY): Payer: Self-pay | Admitting: Family Medicine

## 2018-02-04 DIAGNOSIS — M25542 Pain in joints of left hand: Secondary | ICD-10-CM | POA: Diagnosis not present

## 2018-02-24 ENCOUNTER — Encounter (HOSPITAL_COMMUNITY): Payer: Self-pay | Admitting: Emergency Medicine

## 2018-02-24 ENCOUNTER — Inpatient Hospital Stay (HOSPITAL_COMMUNITY)
Admission: EM | Admit: 2018-02-24 | Discharge: 2018-02-28 | DRG: 291 | Disposition: A | Discharge status: Home / Self Care | Payer: PRIVATE HEALTH INSURANCE | Attending: Internal Medicine | Admitting: Internal Medicine

## 2018-02-24 ENCOUNTER — Other Ambulatory Visit: Payer: Self-pay

## 2018-02-24 ENCOUNTER — Emergency Department (HOSPITAL_COMMUNITY): Payer: PRIVATE HEALTH INSURANCE

## 2018-02-24 ENCOUNTER — Inpatient Hospital Stay (HOSPITAL_COMMUNITY): Payer: PRIVATE HEALTH INSURANCE

## 2018-02-24 DIAGNOSIS — Z833 Family history of diabetes mellitus: Secondary | ICD-10-CM

## 2018-02-24 DIAGNOSIS — R748 Abnormal levels of other serum enzymes: Secondary | ICD-10-CM | POA: Diagnosis not present

## 2018-02-24 DIAGNOSIS — D631 Anemia in chronic kidney disease: Secondary | ICD-10-CM | POA: Diagnosis present

## 2018-02-24 DIAGNOSIS — E0822 Diabetes mellitus due to underlying condition with diabetic chronic kidney disease: Secondary | ICD-10-CM | POA: Diagnosis not present

## 2018-02-24 DIAGNOSIS — E876 Hypokalemia: Secondary | ICD-10-CM | POA: Diagnosis present

## 2018-02-24 DIAGNOSIS — K529 Noninfective gastroenteritis and colitis, unspecified: Secondary | ICD-10-CM | POA: Diagnosis not present

## 2018-02-24 DIAGNOSIS — K219 Gastro-esophageal reflux disease without esophagitis: Secondary | ICD-10-CM | POA: Diagnosis not present

## 2018-02-24 DIAGNOSIS — N186 End stage renal disease: Secondary | ICD-10-CM | POA: Diagnosis not present

## 2018-02-24 DIAGNOSIS — Z794 Long term (current) use of insulin: Secondary | ICD-10-CM | POA: Diagnosis not present

## 2018-02-24 DIAGNOSIS — I132 Hypertensive heart and chronic kidney disease with heart failure and with stage 5 chronic kidney disease, or end stage renal disease: Principal | ICD-10-CM | POA: Diagnosis present

## 2018-02-24 DIAGNOSIS — E1122 Type 2 diabetes mellitus with diabetic chronic kidney disease: Secondary | ICD-10-CM | POA: Diagnosis present

## 2018-02-24 DIAGNOSIS — Z888 Allergy status to other drugs, medicaments and biological substances status: Secondary | ICD-10-CM | POA: Diagnosis not present

## 2018-02-24 DIAGNOSIS — R0602 Shortness of breath: Secondary | ICD-10-CM | POA: Diagnosis not present

## 2018-02-24 DIAGNOSIS — C50919 Malignant neoplasm of unspecified site of unspecified female breast: Secondary | ICD-10-CM | POA: Diagnosis present

## 2018-02-24 DIAGNOSIS — Z853 Personal history of malignant neoplasm of breast: Secondary | ICD-10-CM | POA: Diagnosis not present

## 2018-02-24 DIAGNOSIS — J9601 Acute respiratory failure with hypoxia: Secondary | ICD-10-CM | POA: Diagnosis not present

## 2018-02-24 DIAGNOSIS — Z90710 Acquired absence of both cervix and uterus: Secondary | ICD-10-CM

## 2018-02-24 DIAGNOSIS — I120 Hypertensive chronic kidney disease with stage 5 chronic kidney disease or end stage renal disease: Secondary | ICD-10-CM | POA: Diagnosis not present

## 2018-02-24 DIAGNOSIS — Z7984 Long term (current) use of oral hypoglycemic drugs: Secondary | ICD-10-CM

## 2018-02-24 DIAGNOSIS — I10 Essential (primary) hypertension: Secondary | ICD-10-CM | POA: Diagnosis present

## 2018-02-24 DIAGNOSIS — Z6841 Body Mass Index (BMI) 40.0 and over, adult: Secondary | ICD-10-CM

## 2018-02-24 DIAGNOSIS — E1165 Type 2 diabetes mellitus with hyperglycemia: Secondary | ICD-10-CM | POA: Diagnosis present

## 2018-02-24 DIAGNOSIS — Z8249 Family history of ischemic heart disease and other diseases of the circulatory system: Secondary | ICD-10-CM | POA: Diagnosis not present

## 2018-02-24 DIAGNOSIS — G4733 Obstructive sleep apnea (adult) (pediatric): Secondary | ICD-10-CM | POA: Diagnosis present

## 2018-02-24 DIAGNOSIS — R778 Other specified abnormalities of plasma proteins: Secondary | ICD-10-CM | POA: Diagnosis present

## 2018-02-24 DIAGNOSIS — I5033 Acute on chronic diastolic (congestive) heart failure: Secondary | ICD-10-CM | POA: Diagnosis present

## 2018-02-24 DIAGNOSIS — A09 Infectious gastroenteritis and colitis, unspecified: Secondary | ICD-10-CM | POA: Diagnosis present

## 2018-02-24 DIAGNOSIS — R0609 Other forms of dyspnea: Secondary | ICD-10-CM

## 2018-02-24 DIAGNOSIS — E669 Obesity, unspecified: Secondary | ICD-10-CM | POA: Diagnosis present

## 2018-02-24 DIAGNOSIS — Z79899 Other long term (current) drug therapy: Secondary | ICD-10-CM | POA: Diagnosis not present

## 2018-02-24 DIAGNOSIS — Z9012 Acquired absence of left breast and nipple: Secondary | ICD-10-CM | POA: Diagnosis not present

## 2018-02-24 DIAGNOSIS — Z7951 Long term (current) use of inhaled steroids: Secondary | ICD-10-CM | POA: Diagnosis not present

## 2018-02-24 DIAGNOSIS — Z992 Dependence on renal dialysis: Secondary | ICD-10-CM | POA: Diagnosis not present

## 2018-02-24 DIAGNOSIS — R0600 Dyspnea, unspecified: Secondary | ICD-10-CM | POA: Diagnosis not present

## 2018-02-24 DIAGNOSIS — R0902 Hypoxemia: Secondary | ICD-10-CM | POA: Diagnosis present

## 2018-02-24 DIAGNOSIS — R7989 Other specified abnormal findings of blood chemistry: Secondary | ICD-10-CM

## 2018-02-24 DIAGNOSIS — E119 Type 2 diabetes mellitus without complications: Secondary | ICD-10-CM

## 2018-02-24 DIAGNOSIS — D649 Anemia, unspecified: Secondary | ICD-10-CM | POA: Diagnosis present

## 2018-02-24 LAB — CBC WITH DIFFERENTIAL/PLATELET
Basophils Absolute: 0 10*3/uL (ref 0.0–0.1)
Basophils Relative: 0 %
EOS PCT: 1 %
Eosinophils Absolute: 0.1 10*3/uL (ref 0.0–0.7)
HCT: 29.3 % — ABNORMAL LOW (ref 36.0–46.0)
Hemoglobin: 9.1 g/dL — ABNORMAL LOW (ref 12.0–15.0)
LYMPHS ABS: 1.8 10*3/uL (ref 0.7–4.0)
LYMPHS PCT: 17 %
MCH: 27.5 pg (ref 26.0–34.0)
MCHC: 31.1 g/dL (ref 30.0–36.0)
MCV: 88.5 fL (ref 78.0–100.0)
MONO ABS: 0.3 10*3/uL (ref 0.1–1.0)
Monocytes Relative: 3 %
Neutro Abs: 7.8 10*3/uL — ABNORMAL HIGH (ref 1.7–7.7)
Neutrophils Relative %: 79 %
PLATELETS: 252 10*3/uL (ref 150–400)
RBC: 3.31 MIL/uL — ABNORMAL LOW (ref 3.87–5.11)
RDW: 16 % — AB (ref 11.5–15.5)
WBC: 10.1 10*3/uL (ref 4.0–10.5)

## 2018-02-24 LAB — COMPREHENSIVE METABOLIC PANEL
ALT: 18 U/L (ref 14–54)
AST: 19 U/L (ref 15–41)
Albumin: 4.2 g/dL (ref 3.5–5.0)
Alkaline Phosphatase: 116 U/L (ref 38–126)
Anion gap: 17 — ABNORMAL HIGH (ref 5–15)
BILIRUBIN TOTAL: 0.9 mg/dL (ref 0.3–1.2)
BUN: 20 mg/dL (ref 6–20)
CHLORIDE: 97 mmol/L — AB (ref 101–111)
CO2: 24 mmol/L (ref 22–32)
CREATININE: 5.65 mg/dL — AB (ref 0.44–1.00)
Calcium: 9.8 mg/dL (ref 8.9–10.3)
GFR, EST AFRICAN AMERICAN: 9 mL/min — AB (ref >60)
GFR, EST NON AFRICAN AMERICAN: 8 mL/min — AB (ref >60)
Glucose, Bld: 192 mg/dL — ABNORMAL HIGH (ref 65–99)
Potassium: 3.2 mmol/L — ABNORMAL LOW (ref 3.5–5.1)
Sodium: 138 mmol/L (ref 135–145)
TOTAL PROTEIN: 8.1 g/dL (ref 6.5–8.1)

## 2018-02-24 LAB — BLOOD GAS, ARTERIAL
Acid-Base Excess: 5.1 mmol/L — ABNORMAL HIGH (ref 0.0–2.0)
Bicarbonate: 29.1 mmol/L — ABNORMAL HIGH (ref 20.0–28.0)
Drawn by: 234301
O2 Content: 2 L/min
O2 SAT: 87.7 %
PATIENT TEMPERATURE: 37
PCO2 ART: 35.4 mmHg (ref 32.0–48.0)
PO2 ART: 52.5 mmHg — AB (ref 83.0–108.0)
pH, Arterial: 7.513 — ABNORMAL HIGH (ref 7.350–7.450)

## 2018-02-24 LAB — BRAIN NATRIURETIC PEPTIDE: B NATRIURETIC PEPTIDE 5: 109 pg/mL — AB (ref 0.0–100.0)

## 2018-02-24 LAB — GLUCOSE, CAPILLARY
GLUCOSE-CAPILLARY: 216 mg/dL — AB (ref 65–99)
Glucose-Capillary: 134 mg/dL — ABNORMAL HIGH (ref 65–99)

## 2018-02-24 LAB — D-DIMER, QUANTITATIVE (NOT AT ARMC): D DIMER QUANT: 1.81 ug{FEU}/mL — AB (ref 0.00–0.50)

## 2018-02-24 LAB — TROPONIN I: Troponin I: 0.03 ng/mL (ref <0.03)

## 2018-02-24 MED ORDER — ONDANSETRON 4 MG PO TBDP: 4.0000 mg | ORAL_TABLET | Freq: Three times a day (TID) | ORAL | Status: DC | PRN

## 2018-02-24 MED ORDER — RENA-VITE PO TABS
1.0000 | ORAL_TABLET | Freq: Every day | ORAL | Status: DC
  Administered 2018-02-24 – 2018-02-28 (×5): 1 via ORAL
  Filled 2018-02-24 (×8): qty 1

## 2018-02-24 MED ORDER — ONDANSETRON HCL 4 MG/2ML IJ SOLN
4.0000 mg | Freq: Four times a day (QID) | INTRAMUSCULAR | Status: DC | PRN
  Administered 2018-02-25 – 2018-02-28 (×5): 4 mg via INTRAVENOUS
  Filled 2018-02-24 (×5): qty 2

## 2018-02-24 MED ORDER — FLUTICASONE PROPIONATE 50 MCG/ACT NA SUSP
2.0000 | Freq: Every day | NASAL | Status: DC
  Administered 2018-02-25 – 2018-02-28 (×4): 2 via NASAL
  Filled 2018-02-24 (×2): qty 16

## 2018-02-24 MED ORDER — SEVELAMER CARBONATE 800 MG PO TABS: 1600.0000 mg | ORAL_TABLET | Freq: Three times a day (TID) | ORAL | Status: DC | PRN

## 2018-02-24 MED ORDER — LABETALOL HCL 200 MG PO TABS
200.0000 mg | ORAL_TABLET | Freq: Two times a day (BID) | ORAL | Status: DC
  Administered 2018-02-24 – 2018-02-28 (×7): 200 mg via ORAL
  Filled 2018-02-24 (×8): qty 1

## 2018-02-24 MED ORDER — SEVELAMER CARBONATE 800 MG PO TABS
800.0000 mg | ORAL_TABLET | Freq: Two times a day (BID) | ORAL | Status: DC
  Administered 2018-02-24 – 2018-02-25 (×2): 800 mg via ORAL
  Filled 2018-02-24 (×8): qty 1

## 2018-02-24 MED ORDER — GLIPIZIDE 5 MG PO TABS: 5.0000 mg | ORAL_TABLET | Freq: Two times a day (BID) | ORAL | Status: DC | PRN

## 2018-02-24 MED ORDER — SODIUM CHLORIDE 0.9% FLUSH
3.0000 mL | Freq: Two times a day (BID) | INTRAVENOUS | Status: DC
  Administered 2018-02-24 – 2018-02-28 (×7): 3 mL via INTRAVENOUS

## 2018-02-24 MED ORDER — ROPINIROLE HCL ER 2 MG PO TB24
2.0000 mg | ORAL_TABLET | Freq: Every day | ORAL | Status: DC
  Filled 2018-02-24 (×2): qty 1

## 2018-02-24 MED ORDER — CINACALCET HCL 30 MG PO TABS
30.0000 mg | ORAL_TABLET | Freq: Every day | ORAL | Status: DC
  Filled 2018-02-24 (×2): qty 1

## 2018-02-24 MED ORDER — ROPINIROLE HCL 1 MG PO TABS
2.0000 mg | ORAL_TABLET | Freq: Every day | ORAL | Status: DC
  Administered 2018-02-24 – 2018-02-27 (×4): 2 mg via ORAL
  Filled 2018-02-24 (×4): qty 2

## 2018-02-24 MED ORDER — LUBIPROSTONE 24 MCG PO CAPS
24.0000 ug | ORAL_CAPSULE | Freq: Two times a day (BID) | ORAL | Status: DC
  Administered 2018-02-24 – 2018-02-25 (×2): 24 ug via ORAL
  Filled 2018-02-24 (×6): qty 1

## 2018-02-24 MED ORDER — PREDNISONE 10 MG PO TABS
60.0000 mg | ORAL_TABLET | Freq: Two times a day (BID) | ORAL | Status: DC
  Filled 2018-02-24 (×4): qty 1

## 2018-02-24 MED ORDER — PANTOPRAZOLE SODIUM 40 MG PO TBEC
40.0000 mg | DELAYED_RELEASE_TABLET | Freq: Every day | ORAL | Status: DC
  Administered 2018-02-24 – 2018-02-28 (×5): 40 mg via ORAL
  Filled 2018-02-24 (×5): qty 1

## 2018-02-24 MED ORDER — SODIUM CHLORIDE 0.9% FLUSH: 3.0000 mL | INTRAVENOUS | Status: DC | PRN

## 2018-02-24 MED ORDER — ORAL CARE MOUTH RINSE
15.0000 mL | Freq: Two times a day (BID) | OROMUCOSAL | Status: DC
  Administered 2018-02-25 – 2018-02-27 (×2): 15 mL via OROMUCOSAL

## 2018-02-24 MED ORDER — PREDNISONE 20 MG PO TABS
60.0000 mg | ORAL_TABLET | Freq: Two times a day (BID) | ORAL | Status: DC
  Administered 2018-02-24 – 2018-02-25 (×2): 60 mg via ORAL
  Filled 2018-02-24 (×2): qty 3

## 2018-02-24 MED ORDER — FUROSEMIDE 80 MG PO TABS
80.0000 mg | ORAL_TABLET | Freq: Every morning | ORAL | Status: DC
  Administered 2018-02-25 – 2018-02-28 (×4): 80 mg via ORAL
  Filled 2018-02-24 (×5): qty 1

## 2018-02-24 MED ORDER — INSULIN ASPART 100 UNIT/ML ~~LOC~~ SOLN
0.0000 [IU] | Freq: Every day | SUBCUTANEOUS | Status: DC
  Administered 2018-02-24: 2 [IU] via SUBCUTANEOUS

## 2018-02-24 MED ORDER — GUAIFENESIN ER 600 MG PO TB12
1200.0000 mg | ORAL_TABLET | Freq: Two times a day (BID) | ORAL | Status: DC
  Administered 2018-02-24 – 2018-02-28 (×8): 1200 mg via ORAL
  Filled 2018-02-24 (×12): qty 2

## 2018-02-24 MED ORDER — ONDANSETRON HCL 4 MG PO TABS: 4.0000 mg | ORAL_TABLET | Freq: Four times a day (QID) | ORAL | Status: DC | PRN

## 2018-02-24 MED ORDER — INSULIN ASPART 100 UNIT/ML ~~LOC~~ SOLN
0.0000 [IU] | Freq: Three times a day (TID) | SUBCUTANEOUS | Status: DC
  Administered 2018-02-25 (×2): 15 [IU] via SUBCUTANEOUS
  Administered 2018-02-25: 2 [IU] via SUBCUTANEOUS
  Administered 2018-02-27: 3 [IU] via SUBCUTANEOUS
  Administered 2018-02-27: 5 [IU] via SUBCUTANEOUS

## 2018-02-24 MED ORDER — LACTULOSE 10 GM/15ML PO SOLN: 20.0000 g | Freq: Every day | ORAL | Status: DC | PRN

## 2018-02-24 MED ORDER — SODIUM CHLORIDE 0.9 % IV SOLN: 250.0000 mL | INTRAVENOUS | Status: DC | PRN

## 2018-02-24 MED ORDER — IPRATROPIUM-ALBUTEROL 0.5-2.5 (3) MG/3ML IN SOLN: 3.0000 mL | Freq: Four times a day (QID) | RESPIRATORY_TRACT | Status: DC | PRN

## 2018-02-24 MED ORDER — GUAIFENESIN-DM 100-10 MG/5ML PO SYRP
5.0000 mL | ORAL_SOLUTION | ORAL | Status: DC | PRN
  Administered 2018-02-24 – 2018-02-27 (×5): 5 mL via ORAL
  Filled 2018-02-24 (×5): qty 5

## 2018-02-24 MED ORDER — NIFEDIPINE ER OSMOTIC RELEASE 30 MG PO TB24
120.0000 mg | ORAL_TABLET | Freq: Every day | ORAL | Status: DC
  Administered 2018-02-24 – 2018-02-26 (×2): 120 mg via ORAL
  Filled 2018-02-24 (×2): qty 4

## 2018-02-24 MED ORDER — ENSURE ENLIVE PO LIQD: 237.0000 mL | Freq: Two times a day (BID) | ORAL | Status: DC

## 2018-02-24 MED ORDER — FERRIC CITRATE 1 GM 210 MG(FE) PO TABS
420.0000 mg | ORAL_TABLET | Freq: Two times a day (BID) | ORAL | Status: DC
  Administered 2018-02-25 – 2018-02-26 (×2): 420 mg via ORAL
  Filled 2018-02-24 (×11): qty 2

## 2018-02-24 MED ORDER — IPRATROPIUM-ALBUTEROL 0.5-2.5 (3) MG/3ML IN SOLN
3.0000 mL | Freq: Four times a day (QID) | RESPIRATORY_TRACT | Status: DC
  Administered 2018-02-24 – 2018-02-27 (×8): 3 mL via RESPIRATORY_TRACT
  Filled 2018-02-24 (×9): qty 3

## 2018-02-24 MED ORDER — FUROSEMIDE 40 MG PO TABS
40.0000 mg | ORAL_TABLET | Freq: Every day | ORAL | Status: DC
  Administered 2018-02-24 – 2018-02-27 (×4): 40 mg via ORAL
  Filled 2018-02-24 (×4): qty 1

## 2018-02-24 MED ORDER — ACETAMINOPHEN 500 MG PO TABS
500.0000 mg | ORAL_TABLET | Freq: Three times a day (TID) | ORAL | Status: DC | PRN
  Administered 2018-02-24 – 2018-02-27 (×5): 500 mg via ORAL
  Filled 2018-02-24 (×5): qty 1

## 2018-02-24 MED ORDER — GABAPENTIN 300 MG PO CAPS
300.0000 mg | ORAL_CAPSULE | Freq: Three times a day (TID) | ORAL | Status: DC
  Administered 2018-02-24 – 2018-02-28 (×8): 300 mg via ORAL
  Filled 2018-02-24 (×11): qty 1

## 2018-02-24 MED ORDER — HEPARIN SODIUM (PORCINE) 5000 UNIT/ML IJ SOLN
5000.0000 [IU] | Freq: Three times a day (TID) | INTRAMUSCULAR | Status: DC
  Administered 2018-02-24 – 2018-02-28 (×6): 5000 [IU] via SUBCUTANEOUS
  Filled 2018-02-24 (×9): qty 1

## 2018-02-24 MED ORDER — IOPAMIDOL (ISOVUE-370) INJECTION 76%
100.0000 mL | Freq: Once | INTRAVENOUS | Status: AC | PRN
  Administered 2018-02-24: 100 mL via INTRAVENOUS

## 2018-02-24 MED ORDER — CINACALCET HCL 30 MG PO TABS: 60.0000 mg | ORAL_TABLET | Freq: Every evening | ORAL | Status: DC

## 2018-02-24 MED ORDER — CINACALCET HCL 30 MG PO TABS
30.0000 mg | ORAL_TABLET | Freq: Every evening | ORAL | Status: DC
  Administered 2018-02-24 – 2018-02-27 (×3): 30 mg via ORAL
  Filled 2018-02-24 (×3): qty 1

## 2018-02-24 NOTE — ED Notes (Signed)
CRITICAL VALUE ALERT  Critical Value:  Trop 0.03  Date & Time Notied:  03/07/1504  Provider Notified: Dr. Melina Copa  Orders Received/Actions taken: see chart

## 2018-02-24 NOTE — H&P (Addendum)
History and Physical    TRUE Shackleford IWO:[id number] DOB: [birth year] DOA: 02/24/2018  PCP: Jake Samples, PA-C   Patient coming from: Home  Chief Complaint: Dyspnea  HPI: Patricia [name] is a 47 y.o. female with medical history significant for diabetes, hypertension, prior breast cancer status post mastectomy in remission, and ESRD on hemodialysis, who states that over the past week she has been having some gastrointestinal discomfort with some nausea and vomiting as well as some diarrhea which has been improving.  She states that she began to develop some shortness of breath that began 2-3 days ago over the weekend and this is associated with a cough.  She says she has had some white sputum, but denies any fever, chills, or rhinorrhea.  She denies any increased weight gain or lower extremity edema and states that the dyspnea is worse with exertion and she starts to feel somewhat lightheaded.  She underwent hemodialysis today and goes to her typical sessions on Monday, Wednesday, and Fridays and she even had extra fluid removed, but did not seem to feel any better afterwards.  She was actually noted to be hypoxemic and required 3 L nasal cannula and in triage here was noted to be 84% in room air. She denies any chest pain and uses her home oxygen intermittently, but has needed to use this more over the last couple days due to her dyspnea. She denies any hemoptysis.   ED Course: Vital signs are noted to be stable and her EKG demonstrates sinus rhythm with 91 bpm.  Chest x-ray demonstrates only mild pulmonary edema.  Laboratory data demonstrates stable anemia with hemoglobin 9.1, hypokalemia with potassium 3.2, creatinine 5.65, and troponin 0.03.  BNP is 109 and d-dimer has not been performed.  Review of Systems: All others reviewed and otherwise negative.  Past Medical History:  Diagnosis Date  . Blood transfusion without reported diagnosis   . Cancer ([place])   . Diabetes mellitus without  complication ([place])   . Dialysis patient ([place])    mon, wed, friday,   . Hypertension   . OSA (obstructive sleep apnea)   . Renal disorder   . Renal insufficiency     Past Surgical History:  Procedure Laterality Date  . ABDOMINAL HYSTERECTOMY    . AV FISTULA PLACEMENT  11/2014   at [place] N/A 07/10/2016   Procedure: BALLOON DILATION;  Surgeon: Danie Binder, MD;  Location: [facility];  Service: Endoscopy;  Laterality: N/A;  Pyloric dilation  . BREAST LUMPECTOMY    . CESAREAN SECTION    . CHOLECYSTECTOMY    . COLONOSCOPY WITH PROPOFOL N/A 09/27/2016   Procedure: COLONOSCOPY WITH PROPOFOL;  Surgeon: Daneil Dolin, MD;  Location: [facility];  Service: Endoscopy;  Laterality: N/A;  2:00  . DILATION AND CURETTAGE OF UTERUS    . ESOPHAGOGASTRODUODENOSCOPY N/A 07/10/2016   Dr.Fields- normal esophagus, gastric stenosis was found at the pylorus, gastritis on bx, normal examined duodenun  . MASTECTOMY     left sided     reports that  has never smoked. she has never used smokeless tobacco. She reports that she does not drink alcohol or use drugs.  Allergies  Allergen Reactions  . Reglan [Metoclopramide] Other (See Comments)    hallucinations hallucinations  . Amlodipine Besylate Rash and Other (See Comments)    dizziness    Family History  Problem Relation Age of Onset  . Diabetes Mellitus II Mother   . Hypertension Mother   .  Hypertension Sister   . Hypertension Sister   . Colon cancer Neg Hx     Prior to Admission medications   Medication Sig Start Date End Date Taking? Authorizing Provider  acetaminophen (TYLENOL) 500 MG tablet Take 500 mg by mouth 3 (three) times daily as needed for moderate pain.   Yes [provider]  albuterol (PROVENTIL HFA;VENTOLIN HFA) 108 (90 Base) MCG/ACT inhaler Inhale 2 puffs into the lungs every 6 (six) hours as needed for wheezing or shortness of breath. 11/28/17  Yes Kathie Dike, MD  albuterol  (PROVENTIL) (2.5 MG/3ML) 0.083% nebulizer solution Take 3 mLs (2.5 mg total) by nebulization every 6 (six) hours as needed for wheezing or shortness of breath. 11/28/17  Yes Kathie Dike, MD  AMITIZA 24 MCG capsule TAKE 1 CAPSULE (24 MCG TOTAL) BY MOUTH 2 (TWO) TIMES DAILY WITH A MEAL. 05/14/17  Yes Carlis Stable, NP  Ferric Citrate (AURYXIA) 1 GM 210 MG(Fe) TABS Take 2 tablets by mouth 2 (two) times daily with a meal.   Yes [provider]  fluticasone (FLONASE) 50 MCG/ACT nasal spray Place 2 sprays into both nostrils daily. LEFT NARE ONLY 08/08/17  Yes [provider]  furosemide (LASIX) 40 MG tablet Take 40-80 mg by mouth 2 (two) times daily. 80mg  in the morning and 40mg  at bedtime   Yes [provider]  glipiZIDE (GLUCOTROL) 5 MG tablet Take 5 mg by mouth 2 (two) times daily as needed. For blood sugar levels over 150   Yes [provider]  guaiFENesin (MUCINEX) 600 MG 12 hr tablet Take 1,200 mg by mouth 2 (two) times daily.   Yes [provider]  labetalol (NORMODYNE) 200 MG tablet Take 200 mg by mouth 2 (two) times daily.   Yes [provider]  lactulose (CHRONULAC) 10 GM/15ML solution Take 30 mLs by mouth daily as needed for mild constipation.  02/14/18  Yes [provider]  multivitamin (RENA-VIT) TABS tablet Take 1 tablet by mouth daily.   Yes [provider]  NIFEdipine (PROCARDIA-XL/ADALAT CC) 60 MG 24 hr tablet Take 2 tablets (120 mg total) by mouth daily. 11/28/17  Yes Kathie Dike, MD  ondansetron (ZOFRAN-ODT) 4 MG disintegrating tablet Take 4 mg by mouth every 8 (eight) hours as needed for nausea or vomiting.   Yes [provider]  pantoprazole (PROTONIX) 20 MG tablet Take 20 mg by mouth daily. 06/28/17  Yes [provider]  rOPINIRole (REQUIP XL) 2 MG 24 hr tablet Take 2 mg by mouth at bedtime.  09/10/16  Yes [provider]  SENSIPAR 60 MG tablet Take 60 mg by mouth every evening. 09/19/17   Yes [provider]  sevelamer carbonate (RENVELA) 800 MG tablet Take 800 mg by mouth See admin instructions. Take 1 tablet in the morning and 1 tablet in the evening. Take 2 tablets if you eat snack.   Yes [provider]  cinacalcet (SENSIPAR) 30 MG tablet Take 30 mg by mouth daily.    [provider]  gabapentin (NEURONTIN) 300 MG capsule Take 300 mg by mouth 3 (three) times daily. 01/30/18   [provider]  predniSONE (DELTASONE) 10 MG tablet Take 40mg  po daily for 2 days then 30mg  daily for 2 days then 20mg  daily for 2 days then 10mg  daily for 2 days then stop Patient not taking: Reported on 02/24/2018 11/28/17   Kathie Dike, MD    Physical Exam: Vitals:   02/24/18 1218 02/24/18 1235 02/24/18 1342 02/24/18 1718  BP:   (!) 159/79 (!) 163/106  Pulse:   87 83  Resp:   20 (!) 22  Temp:    98.3 F (36.8 C)  TempSrc:    Oral  SpO2:  91% 95% 94%  Weight: 114.7 kg (252 lb 14 oz)     Height: 5\' 3"  (1.6 m)       Constitutional: NAD, calm, comfortable on 4L [place]; obese Vitals:   02/24/18 1218 02/24/18 1235 02/24/18 1342 02/24/18 1718  BP:   (!) 159/79 (!) 163/106  Pulse:   87 83  Resp:   20 (!) 22  Temp:    98.3 F (36.8 C)  TempSrc:    Oral  SpO2:  91% 95% 94%  Weight: 114.7 kg (252 lb 14 oz)     Height: 5\' 3"  (1.6 m)      Eyes: lids and conjunctivae normal ENMT: Mucous membranes are moist.  Neck: normal, supple Respiratory: clear to auscultation bilaterally. Normal respiratory effort. No accessory muscle use.  Cardiovascular: Regular rate and rhythm, no murmurs. No extremity edema. Abdomen: no tenderness, no distention. Bowel sounds positive.  Musculoskeletal:  No joint deformity upper and lower extremities.   Skin: no rashes, lesions, ulcers.  Psychiatric: Normal judgment and insight. Alert and oriented x 3. Normal mood.   Labs on Admission: I have personally reviewed following labs and imaging studies  CBC: Recent Labs  Lab  02/24/18 1412  WBC 10.1  NEUTROABS 7.8*  HGB 9.1*  HCT 29.3*  MCV 88.5  PLT 062   Basic Metabolic Panel: Recent Labs  Lab 02/24/18 1412  NA 138  K 3.2*  CL 97*  CO2 24  GLUCOSE 192*  BUN 20  CREATININE 5.65*  CALCIUM 9.8   GFR: Estimated Creatinine Clearance: 14.9 mL/min (A) (by C-G formula based on SCr of 5.65 mg/dL (H)). Liver Function Tests: Recent Labs  Lab 02/24/18 1412  AST 19  ALT 18  ALKPHOS 116  BILITOT 0.9  PROT 8.1  ALBUMIN 4.2   No results for input(s): LIPASE, AMYLASE in the last 168 hours. No results for input(s): AMMONIA in the last 168 hours. Coagulation Profile: No results for input(s): INR, PROTIME in the last 168 hours. Cardiac Enzymes: Recent Labs  Lab 02/24/18 1412  TROPONINI 0.03*   BNP (last 3 results) No results for input(s): PROBNP in the last 8760 hours. HbA1C: No results for input(s): HGBA1C in the last 72 hours. CBG: No results for input(s): GLUCAP in the last 168 hours. Lipid Profile: No results for input(s): CHOL, HDL, LDLCALC, TRIG, CHOLHDL, LDLDIRECT in the last 72 hours. Thyroid Function Tests: No results for input(s): TSH, T4TOTAL, FREET4, T3FREE, THYROIDAB in the last 72 hours. Anemia Panel: No results for input(s): VITAMINB12, FOLATE, FERRITIN, TIBC, IRON, RETICCTPCT in the last 72 hours. Urine analysis:    Component Value Date/Time   COLORURINE YELLOW 09/14/2016 1915   APPEARANCEUR CLEAR 09/14/2016 1915   LABSPEC 1.015 09/14/2016 1915   PHURINE 7.5 09/14/2016 1915   GLUCOSEU >1000 (A) 09/14/2016 1915   HGBUR TRACE (A) 09/14/2016 1915   BILIRUBINUR NEGATIVE 09/14/2016 [place] NEGATIVE 09/14/2016 1915   PROTEINUR >300 (A) 09/14/2016 1915   NITRITE NEGATIVE 09/14/2016 1915   LEUKOCYTESUR NEGATIVE 09/14/2016 1915    Radiological Exams on Admission: Dg Chest 2 View  Result Date: 02/24/2018 CLINICAL DATA:  Shortness of breath on exertion. EXAM: CHEST - 2 VIEW COMPARISON:  Radiographs of November 26, 2017. FINDINGS: Stable cardiomediastinal silhouette and central pulmonary  vascular congestion is noted. No pneumothorax or significant pleural effusion is noted. Bony thorax is unremarkable. Left axillary surgical clips are noted. Possible mild bibasilar pulmonary edema may be present. Bony thorax is unremarkable. IMPRESSION: Stable central pulmonary vascular congestion with possible mild bibasilar pulmonary edema. Electronically Signed   By: Marijo Conception, M.D.   On: 02/24/2018 12:48   Ct Angio Chest Pe W Or Wo Contrast  Result Date: 02/24/2018 CLINICAL DATA:  Shortness of breath. EXAM: CT ANGIOGRAPHY CHEST WITH CONTRAST TECHNIQUE: Multidetector CT imaging of the chest was performed using the standard protocol during bolus administration of intravenous contrast. Multiplanar CT image reconstructions and MIPs were obtained to evaluate the vascular anatomy. CONTRAST:  160mL ISOVUE-370 IOPAMIDOL (ISOVUE-370) INJECTION 76% COMPARISON:  Chest x-ray dated 02/24/2018 and CT scans dated 11/26/2017 and 09/25/2017 and 07/21/2017 FINDINGS: Cardiovascular: Satisfactory opacification of the pulmonary arteries to the segmental level. No evidence of pulmonary embolism. Normal heart size. No pericardial effusion. Mediastinum/Nodes: Chronic mediastinal adenopathy, unchanged since 07/21/2017 Lungs/Pleura: Small bilateral pleural effusions with slight interstitial edema at the right lung base and slight bibasilar atelectasis. Bilateral peribronchial thickening consistent with edema. Upper Abdomen: No acute abnormality. Musculoskeletal: Left mastectomy. No acute or significant osseous findings. Review of the MIP images confirms the above findings. IMPRESSION: 1. No pulmonary emboli. 2. Mild interstitial edema at the right lung base. 3. Minimal bilateral pleural effusions with bibasilar atelectasis. Electronically Signed   By: Lorriane Shire M.D.   On: 02/24/2018 17:10    EKG: Independently reviewed. SR  91bpm.  Assessment/Plan Principal Problem:   Acute respiratory failure with hypoxia (HCC) Active Problems:   End stage renal disease on dialysis (HCC)   Diabetes mellitus (HCC)   Essential hypertension   GERD (gastroesophageal reflux disease)   Breast cancer (HCC)   Hypokalemia   Anemia   Elevated troponin   Hypoxemia    1. Acute hypoxemic respiratory failure.  She currently carries an intermediate probability and will score for possible PE ([place] 4).  I ordered CT angiogram which was negative for PE. Therefore, at this time, possibilities may include pulmonary edema which may be contributing to the process vs. Possibly some interstitial pneumonitis. Will schedule duonebs and administer prednisone. Check ABG. 2. Elevated troponin.  This is only mild with no EKG changes or symptomatology and I will not pursue this further. 3. ESRD on HD with hypokalemia.  Consult to nephrology for further evaluation and hemodialysis while inpatient especially after contrast study today. 4. Diabetes.  She is noted to be hyperglycemic currently and will order sliding scale insulin and monitor.  Continue home medications. 5. Anemia of chronic disease.  She appears to be stable per her baseline, continue to monitor. 6. Hypertension.  Currently stable.  Continue home medications. 7. GERD.  Continue on Protonix.   DVT prophylaxis: Heparin Code Status: Full Family Communication: None Disposition Plan:Further eval and treatment directed to hypoxemia Consults called:Nephrology Admission status: Inpatient, med-surg   Pratik Darleen Crocker DO [facility] Pager [phone]  If 7PM-7AM, please contact night-coverage [email or web address] Password [facility]  02/24/2018, 5:37 PM

## 2018-02-24 NOTE — ED Provider Notes (Signed)
[facility] EMERGENCY DEPARTMENT Provider Note   CSN: [id number] Arrival date & time: 02/24/18  1211     History   Chief Complaint Chief Complaint  Patient presents with  . Shortness of Breath    HPI Patricia [name] is a 47 y.o. female.  She presents after going to dialysis today with increased shortness of breath and dyspnea on exertion over the past 3 days.  It is associated with a cough with some chalky white sputum.  No fever but she is noticed sweats and chills at night.  The cough is so bad that sometimes it causes her to vomit.  She denies any runny nose sore throat.  They try to take extra fluid off her dialysis and she still was symptomatic.  Over the winter she had a similar episode and she was admitted with pneumonia.  She was discharged on oxygen to use as needed but has not needed it since then until the last few days.  In triage of oxygen they found her to be 84% on room air.  HPI  Past Medical History:  Diagnosis Date  . Blood transfusion without reported diagnosis   . Cancer ([place])   . Diabetes mellitus without complication ([place])   . Dialysis patient ([place])    mon, wed, friday,   . Hypertension   . OSA (obstructive sleep apnea)   . Renal disorder   . Renal insufficiency     Patient Active Problem List   Diagnosis Date Noted  . Dyspnea 11/26/2017  . Anemia 11/26/2017  . Elevated troponin 11/26/2017  . Hyperkalemia 09/29/2017  . Acute respiratory failure ([place]) 09/28/2017  . CAP (community acquired pneumonia) 09/26/2017  . Volume overload 09/25/2017  . Acute on chronic diastolic CHF (congestive heart failure) ([place]) 09/25/2017  . Lactic acidosis 09/25/2017  . Hypokalemia 09/25/2017  . Breast cancer ([place]) 07/22/2017  . SOB (shortness of breath)   . Acute respiratory failure with hypoxia ([place]) 07/21/2017  . Flatulence 10/25/2016  . GERD (gastroesophageal reflux disease) 08/02/2016  . Pyloric stenosis, acquired   . Nausea with vomiting   . Generalized abdominal  pain   . Chest pain, rule out acute myocardial infarction 07/04/2016  . Constipation 07/04/2016  . Nausea 07/04/2016  . Essential hypertension 07/04/2016  . HCAP (healthcare-associated pneumonia) 12/04/2015  . End stage renal disease on dialysis ([place]) 12/04/2015  . Diabetes mellitus ([place]) 12/04/2015    Past Surgical History:  Procedure Laterality Date  . ABDOMINAL HYSTERECTOMY    . AV FISTULA PLACEMENT  11/2014   at [place] N/A 07/10/2016   Procedure: BALLOON DILATION;  Surgeon: Danie Binder, MD;  Location: [facility] ENDO SU[facility];  Service: Endoscopy;  Laterality: N/A;  Pyloric dilation  . BREAST LUMPECTOMY    . CESAREAN SECTION    . CHOLECYSTECTOMY    . COLONOSCOPY WITH PROPOFOL N/A 09/27/2016   Procedure: COLONOSCOPY WITH PROPOFOL;  Surgeon: Daneil Dolin, MD;  Location: [facility] ENDO SUITE;  Service: Endoscopy;  Laterality: N/A;  2:00  . DILATION AND CURETTAGE OF UTERUS    . ESOPHAGOGASTRODUODENOSCOPY N/A 07/10/2016   Dr.Fields- normal esophagus, gastric stenosis was found at the pylorus, gastritis on bx, normal examined duodenun  . MASTECTOMY     left sided    OB History    Gravida Para Term Preterm AB Living   3 2 1 1 1      SAB TAB Ectopic Multiple Live Births   1  Home Medications    Prior to Admission medications   Medication Sig Start Date End Date Taking? Authorizing Provider  acetaminophen (TYLENOL) 500 MG tablet Take 500 mg by mouth 3 (three) times daily as needed for moderate pain.    [provider]  albuterol (PROVENTIL HFA;VENTOLIN HFA) 108 (90 Base) MCG/ACT inhaler Inhale 2 puffs into the lungs every 6 (six) hours as needed for wheezing or shortness of breath. 11/28/17   Kathie Dike, MD  albuterol (PROVENTIL) (2.5 MG/3ML) 0.083% nebulizer solution Take 3 mLs (2.5 mg total) by nebulization every 6 (six) hours as needed for wheezing or shortness of breath. 11/28/17   Kathie Dike, MD  AMITIZA 24 MCG capsule TAKE 1  CAPSULE (24 MCG TOTAL) BY MOUTH 2 (TWO) TIMES DAILY WITH A MEAL. 05/14/17   Carlis Stable, NP  chlorhexidine (PERIDEX) 0.12 % solution Swish and spit 15 mLs 2 (two) times daily.    [provider]  Ferric Citrate (AURYXIA) 1 GM 210 MG(Fe) TABS Take 2 tablets by mouth 2 (two) times daily with a meal.    [provider]  fluticasone (FLONASE) 50 MCG/ACT nasal spray Place 2 sprays into both nostrils daily. LEFT NARE ONLY 08/08/17   [provider]  furosemide (LASIX) 40 MG tablet Take 40-80 mg by mouth 2 (two) times daily. 80mg  in the morning and 40mg  at bedtime    [provider]  glipiZIDE (GLUCOTROL) 5 MG tablet Take 5 mg by mouth 2 (two) times daily as needed. For blood sugar levels over 150    [provider]  guaiFENesin (MUCINEX) 600 MG 12 hr tablet Take 1,200 mg by mouth 2 (two) times daily.    [provider]  labetalol (NORMODYNE) 200 MG tablet Take 200 mg by mouth 2 (two) times daily.    [provider]  multivitamin (RENA-VIT) TABS tablet Take 1 tablet by mouth daily.    [provider]  NIFEdipine (PROCARDIA-XL/ADALAT CC) 60 MG 24 hr tablet Take 2 tablets (120 mg total) by mouth daily. 11/28/17   Kathie Dike, MD  pantoprazole (PROTONIX) 20 MG tablet Take 20 mg by mouth daily. 06/28/17   [provider]  predniSONE (DELTASONE) 10 MG tablet Take 40mg  po daily for 2 days then 30mg  daily for 2 days then 20mg  daily for 2 days then 10mg  daily for 2 days then stop 11/28/17   Kathie Dike, MD  rOPINIRole (REQUIP XL) 2 MG 24 hr tablet Take 2 mg by mouth at bedtime.  09/10/16   [provider]  SENSIPAR 60 MG tablet Take 60 mg by mouth every evening. 09/19/17   [provider]  sevelamer carbonate (RENVELA) 800 MG tablet Take 1,600 mg by mouth 3 (three) times daily with meals.     [provider]    Family History Family History  Problem Relation Age of Onset  . Diabetes Mellitus II Mother    . Hypertension Mother   . Hypertension Sister   . Hypertension Sister   . Colon cancer Neg Hx     Social History Social History   Tobacco Use  . Smoking status: Never Smoker  . Smokeless tobacco: Never Used  Substance Use Topics  . Alcohol use: No    Alcohol/week: 0.0 oz  . Drug use: No     Allergies   Reglan [metoclopramide] and Amlodipine besylate   Review of Systems Review of Systems  Constitutional: Positive for chills. Negative for fever.  HENT: Negative for ear pain and  sore throat.   Eyes: Negative for pain and visual disturbance.  Respiratory: Positive for cough and shortness of breath.   Cardiovascular: Negative for chest pain and palpitations.  Gastrointestinal: Negative for abdominal pain and vomiting.  Genitourinary: Negative for dysuria and hematuria.  Musculoskeletal: Negative for arthralgias and back pain.  Skin: Negative for color change and rash.  Neurological: Negative for seizures and syncope.  All other systems reviewed and are negative.    Physical Exam Updated Vital Signs BP (!) 137/119 (BP Location: Right Arm)   Pulse (!) 101   Temp 98.7 F (37.1 C) (Oral)   Resp (!) 22   Ht 5\' 3"  (1.6 m)   Wt 114.7 kg (252 lb 14 oz)   SpO2 91%   BMI 44.79 kg/m   Physical Exam  Constitutional: She appears well-developed and well-nourished. No distress.  HENT:  Head: Normocephalic and atraumatic.  Eyes: Conjunctivae are normal.  Neck: Neck supple.  Cardiovascular: Regular rhythm. Tachycardia present.  No murmur heard. Pulmonary/Chest: Effort normal and breath sounds normal. No respiratory distress.  Abdominal: Soft. There is no tenderness.  Musculoskeletal: She exhibits no edema.  Neurological: She is alert.  Skin: Skin is warm and dry.  Psychiatric: She has a normal mood and affect.  Nursing note and vitals reviewed.    ED Treatments / Results  Labs (all labs ordered are listed, but only abnormal results are displayed) Labs Reviewed    CBC WITH DIFFERENTIAL/PLATELET - Abnormal; Notable for the following components:      Result Value   RBC 3.31 (*)    Hemoglobin 9.1 (*)    HCT 29.3 (*)    RDW 16.0 (*)    Neutro Abs 7.8 (*)    All other components within normal limits  COMPREHENSIVE METABOLIC PANEL - Abnormal; Notable for the following components:   Potassium 3.2 (*)    Chloride 97 (*)    Glucose, Bld 192 (*)    Creatinine, Ser 5.65 (*)    GFR calc non Af Amer 8 (*)    GFR calc Af Amer 9 (*)    Anion gap 17 (*)    All other components within normal limits  TROPONIN I - Abnormal; Notable for the following components:   Troponin I 0.03 (*)    All other components within normal limits  BRAIN NATRIURETIC PEPTIDE - Abnormal; Notable for the following components:   B Natriuretic Peptide 109.0 (*)    All other components within normal limits  D-DIMER, QUANTITATIVE (NOT AT [facility]) - Abnormal; Notable for the following components:   D-Dimer, Quant 1.81 (*)    All other components within normal limits  BASIC METABOLIC PANEL - Abnormal; Notable for the following components:   Chloride 96 (*)    Glucose, Bld 374 (*)    BUN 33 (*)    Creatinine, Ser 7.80 (*)    GFR calc non Af Amer 5 (*)    GFR calc Af Amer 6 (*)    Anion gap 17 (*)    All other components within normal limits  CBC - Abnormal; Notable for the following components:   RBC 3.37 (*)    Hemoglobin 9.2 (*)    HCT 30.5 (*)    RDW 16.1 (*)    All other components within normal limits  BLOOD GAS, ARTERIAL - Abnormal; Notable for the following components:   pH, Arterial 7.513 (*)    pO2, Arterial 52.5 (*)    Bicarbonate 29.1 (*)    Acid-Base  Excess 5.1 (*)    All other components within normal limits  GLUCOSE, CAPILLARY - Abnormal; Notable for the following components:   Glucose-Capillary 134 (*)    All other components within normal limits  GLUCOSE, CAPILLARY - Abnormal; Notable for the following components:   Glucose-Capillary 216 (*)    All other  components within normal limits  GLUCOSE, CAPILLARY - Abnormal; Notable for the following components:   Glucose-Capillary 383 (*)    All other components within normal limits  GLUCOSE, CAPILLARY - Abnormal; Notable for the following components:   Glucose-Capillary 364 (*)    All other components within normal limits  PHOSPHORUS - Abnormal; Notable for the following components:   Phosphorus 5.2 (*)    All other components within normal limits  GLUCOSE, CAPILLARY - Abnormal; Notable for the following components:   Glucose-Capillary 150 (*)    All other components within normal limits  GLUCOSE, CAPILLARY - Abnormal; Notable for the following components:   Glucose-Capillary 143 (*)    All other components within normal limits  MRSA PCR SCREENING  HEPATITIS B SURFACE ANTIGEN  HEPATITIS B SURFACE ANTIGEN  CBC  RENAL FUNCTION PANEL    EKG  EKG Interpretation  Date/Time:  Monday February 24 2018 12:30:11 EDT Ventricular Rate:  91 PR Interval:  172 QRS Duration: 90 QT Interval:  392 QTC Calculation: 482 R Axis:   79 Text Interpretation:  Sinus rhythm with Premature atrial complexes in a pattern of bigeminy Nonspecific ST and T wave abnormality Prolonged QT Abnormal ECG Confirmed by Aletta Edouard [phone]) on 02/24/2018 12:47:24 PM       Radiology Dg Chest 2 View  Result Date: 02/24/2018 CLINICAL DATA:  Shortness of breath on exertion. EXAM: CHEST - 2 VIEW COMPARISON:  Radiographs of November 26, 2017. FINDINGS: Stable cardiomediastinal silhouette and central pulmonary vascular congestion is noted. No pneumothorax or significant pleural effusion is noted. Bony thorax is unremarkable. Left axillary surgical clips are noted. Possible mild bibasilar pulmonary edema may be present. Bony thorax is unremarkable. IMPRESSION: Stable central pulmonary vascular congestion with possible mild bibasilar pulmonary edema. Electronically Signed   By: Marijo Conception, M.D.   On: 02/24/2018 12:48     Procedures Procedures (including critical care time)  Medications Ordered in ED Medications - No data to display   Initial Impression / Assessment and Plan / ED Course  I have reviewed the triage vital signs and the nursing notes.  Pertinent labs & imaging results that were available during my care of the patient were reviewed by me and considered in my medical decision making (see chart for details).     Final Clinical Impressions(s) / ED Diagnoses   Final diagnoses:  Dyspnea on exertion  ESRD on dialysis [facility])    ED Discharge Orders    None       Hayden Rasmussen, MD 02/25/18 2127

## 2018-02-24 NOTE — ED Triage Notes (Signed)
PT c/o continued SOB on exertion over the past few days. PT states she is usually on oxygen as needed but doesn't have a portable tank with her. O2 applied at 2L per N/C in triage. PT states she went to dialysis and stated on the machine the whole time today and then drove herself to the ED after.

## 2018-02-24 NOTE — ED Notes (Signed)
Pt states increased SOB today and did not get better after dialysis. Pt had full dialysis treatment today. Pt states increased sob with exertion which is different than her typical fluid overload.

## 2018-02-25 ENCOUNTER — Inpatient Hospital Stay (HOSPITAL_COMMUNITY): Payer: PRIVATE HEALTH INSURANCE

## 2018-02-25 DIAGNOSIS — E0822 Diabetes mellitus due to underlying condition with diabetic chronic kidney disease: Secondary | ICD-10-CM

## 2018-02-25 DIAGNOSIS — I10 Essential (primary) hypertension: Secondary | ICD-10-CM

## 2018-02-25 DIAGNOSIS — J9601 Acute respiratory failure with hypoxia: Secondary | ICD-10-CM

## 2018-02-25 DIAGNOSIS — Z992 Dependence on renal dialysis: Secondary | ICD-10-CM

## 2018-02-25 DIAGNOSIS — D631 Anemia in chronic kidney disease: Secondary | ICD-10-CM

## 2018-02-25 DIAGNOSIS — K219 Gastro-esophageal reflux disease without esophagitis: Secondary | ICD-10-CM

## 2018-02-25 DIAGNOSIS — N186 End stage renal disease: Secondary | ICD-10-CM

## 2018-02-25 DIAGNOSIS — Z794 Long term (current) use of insulin: Secondary | ICD-10-CM

## 2018-02-25 LAB — PHOSPHORUS: PHOSPHORUS: 5.2 mg/dL — AB (ref 2.5–4.6)

## 2018-02-25 LAB — CBC
HCT: 30.5 % — ABNORMAL LOW (ref 36.0–46.0)
HEMOGLOBIN: 9.2 g/dL — AB (ref 12.0–15.0)
MCH: 27.3 pg (ref 26.0–34.0)
MCHC: 30.2 g/dL (ref 30.0–36.0)
MCV: 90.5 fL (ref 78.0–100.0)
Platelets: 270 10*3/uL (ref 150–400)
RBC: 3.37 MIL/uL — ABNORMAL LOW (ref 3.87–5.11)
RDW: 16.1 % — ABNORMAL HIGH (ref 11.5–15.5)
WBC: 9 10*3/uL (ref 4.0–10.5)

## 2018-02-25 LAB — BASIC METABOLIC PANEL
ANION GAP: 17 — AB (ref 5–15)
BUN: 33 mg/dL — ABNORMAL HIGH (ref 6–20)
CO2: 24 mmol/L (ref 22–32)
Calcium: 9.5 mg/dL (ref 8.9–10.3)
Chloride: 96 mmol/L — ABNORMAL LOW (ref 101–111)
Creatinine, Ser: 7.8 mg/dL — ABNORMAL HIGH (ref 0.44–1.00)
GFR calc Af Amer: 6 mL/min — ABNORMAL LOW (ref >60)
GFR calc non Af Amer: 5 mL/min — ABNORMAL LOW (ref >60)
GLUCOSE: 374 mg/dL — AB (ref 65–99)
Potassium: 4.6 mmol/L (ref 3.5–5.1)
Sodium: 137 mmol/L (ref 135–145)

## 2018-02-25 LAB — GLUCOSE, CAPILLARY
Glucose-Capillary: 383 mg/dL — ABNORMAL HIGH (ref 65–99)
Glucose-Capillary: 364 mg/dL — ABNORMAL HIGH (ref 65–99)
Glucose-Capillary: 150 mg/dL — ABNORMAL HIGH (ref 65–99)
Glucose-Capillary: 143 mg/dL — ABNORMAL HIGH (ref 65–99)

## 2018-02-25 LAB — MRSA PCR SCREENING: MRSA by PCR: NEGATIVE

## 2018-02-25 MED ORDER — SODIUM CHLORIDE 0.9 % IV SOLN: 100.0000 mL | INTRAVENOUS | Status: DC | PRN

## 2018-02-25 MED ORDER — PROMETHAZINE HCL 25 MG/ML IJ SOLN
6.2500 mg | Freq: Four times a day (QID) | INTRAMUSCULAR | Status: DC | PRN
  Administered 2018-02-25 – 2018-02-27 (×2): 6.25 mg via INTRAVENOUS
  Filled 2018-02-25 (×2): qty 1

## 2018-02-25 MED ORDER — HEPARIN SODIUM (PORCINE) 1000 UNIT/ML DIALYSIS
1000.0000 [IU] | INTRAMUSCULAR | Status: DC | PRN
  Filled 2018-02-25: qty 1

## 2018-02-25 MED ORDER — LIDOCAINE-PRILOCAINE 2.5-2.5 % EX CREA: 1.0000 "application " | TOPICAL_CREAM | CUTANEOUS | Status: DC | PRN

## 2018-02-25 MED ORDER — MORPHINE SULFATE (PF) 2 MG/ML IV SOLN
1.0000 mg | INTRAVENOUS | Status: DC | PRN
  Administered 2018-02-25: 2 mg via INTRAVENOUS
  Filled 2018-02-25: qty 1

## 2018-02-25 MED ORDER — IOPAMIDOL (ISOVUE-300) INJECTION 61%
100.0000 mL | Freq: Once | INTRAVENOUS | Status: AC | PRN
  Administered 2018-02-25: 100 mL via INTRAVENOUS

## 2018-02-25 MED ORDER — ALTEPLASE 2 MG IJ SOLR
2.0000 mg | Freq: Once | INTRAMUSCULAR | Status: DC | PRN
  Filled 2018-02-25: qty 2

## 2018-02-25 MED ORDER — LIDOCAINE HCL (PF) 1 % IJ SOLN: 5.0000 mL | INTRAMUSCULAR | Status: DC | PRN

## 2018-02-25 MED ORDER — PENTAFLUOROPROP-TETRAFLUOROETH EX AERO: 1.0000 "application " | INHALATION_SPRAY | CUTANEOUS | Status: DC | PRN

## 2018-02-25 NOTE — Progress Notes (Signed)
Pt doesn't wish to wear CPAP for sleep

## 2018-02-25 NOTE — Progress Notes (Signed)
Offered pt use of CPAP. Pt refused

## 2018-02-25 NOTE — Consult Note (Signed)
Reason for Consult: End-stage renal disease Referring Physician: Dr. Tama High Patricia [name] is an 47 y.o. female.  HPI: She is a patient who has history of hypertension, diabetes, obstructive sleep apnea and end-stage renal disease on maintenance hemodialysis presently came with complaints of cough with whitish sputum, difficulty breathing for the last 3-4 days.  Patient came to dialysis yesterday and were able to remove about 3 L.  However patient continued to complains of difficulty breathing after she finished her dialysis.  Patient denies any chest pain but she has recurrent cough with whitish sputum production.  Finally patient came to the emergency room where she was found to have hypoxemia.  Presently states she has some cough and difficulty breathing but somewhat better.  She denies any orthopnea or paroxysmal nocturnal dyspnea.  Past Medical History:  Diagnosis Date  . Blood transfusion without reported diagnosis   . Cancer ([place])   . Diabetes mellitus without complication ([place])   . Dialysis patient ([place])    mon, wed, friday,   . Hypertension   . OSA (obstructive sleep apnea)   . Renal disorder   . Renal insufficiency     Past Surgical History:  Procedure Laterality Date  . ABDOMINAL HYSTERECTOMY    . AV FISTULA PLACEMENT  11/2014   at [facility] N/A 07/10/2016   Procedure: BALLOON DILATION;  Surgeon: Danie Binder, MD;  Location: [facility];  Service: Endoscopy;  Laterality: N/A;  Pyloric dilation  . BREAST LUMPECTOMY    . CESAREAN SECTION    . CHOLECYSTECTOMY    . COLONOSCOPY WITH PROPOFOL N/A 09/27/2016   Procedure: COLONOSCOPY WITH PROPOFOL;  Surgeon: Daneil Dolin, MD;  Location: [facility];  Service: Endoscopy;  Laterality: N/A;  2:00  . DILATION AND CURETTAGE OF UTERUS    . ESOPHAGOGASTRODUODENOSCOPY N/A 07/10/2016   Dr.Fields- normal esophagus, gastric stenosis was found at the pylorus, gastritis on bx, normal examined duodenun  . MASTECTOMY      left sided    Family History  Problem Relation Age of Onset  . Diabetes Mellitus II Mother   . Hypertension Mother   . Hypertension Sister   . Hypertension Sister   . Colon cancer Neg Hx     Social History:  reports that  has never smoked. she has never used smokeless tobacco. She reports that she does not drink alcohol or use drugs.  Allergies:  Allergies  Allergen Reactions  . Reglan [Metoclopramide] Other (See Comments)    hallucinations hallucinations  . Amlodipine Besylate Rash and Other (See Comments)    dizziness    Medications: I have reviewed the patient's current medications.  Results for orders placed or performed during the hospital encounter of 02/24/18 (from the past 48 hour(s))  CBC with Differential     Status: Abnormal   Collection Time: 02/24/18  2:12 PM  Result Value Ref Range   WBC 10.1 4.0 - 10.5 K/uL   RBC 3.31 (L) 3.87 - 5.11 MIL/uL   Hemoglobin 9.1 (L) 12.0 - 15.0 g/dL   HCT 29.3 (L) 36.0 - 46.0 %   MCV 88.5 78.0 - 100.0 fL   MCH 27.5 26.0 - 34.0 pg   MCHC 31.1 30.0 - 36.0 g/dL   RDW 16.0 (H) 11.5 - 15.5 %   Platelets 252 150 - 400 K/uL   Neutrophils Relative % 79 %   Neutro Abs 7.8 (H) 1.7 - 7.7 K/uL   Lymphocytes Relative 17 %  Lymphs Abs 1.8 0.7 - 4.0 K/uL   Monocytes Relative 3 %   Monocytes Absolute 0.3 0.1 - 1.0 K/uL   Eosinophils Relative 1 %   Eosinophils Absolute 0.1 0.0 - 0.7 K/uL   Basophils Relative 0 %   Basophils Absolute 0.0 0.0 - 0.1 K/uL    Comment: Performed at [facility], [address], [place], [place] [address]  Comprehensive metabolic panel     Status: Abnormal   Collection Time: 02/24/18  2:12 PM  Result Value Ref Range   Sodium 138 135 - 145 mmol/L   Potassium 3.2 (L) 3.5 - 5.1 mmol/L   Chloride 97 (L) 101 - 111 mmol/L   CO2 24 22 - 32 mmol/L   Glucose, Bld 192 (H) 65 - 99 mg/dL   BUN 20 6 - 20 mg/dL   Creatinine, Ser 5.65 (H) 0.44 - 1.00 mg/dL   Calcium 9.8 8.9 - 10.3 mg/dL   Total Protein 8.1 6.5 -  8.1 g/dL   Albumin 4.2 3.5 - 5.0 g/dL   AST 19 15 - 41 U/L   ALT 18 14 - 54 U/L   Alkaline Phosphatase 116 38 - 126 U/L   Total Bilirubin 0.9 0.3 - 1.2 mg/dL   GFR calc non Af Amer 8 (L) >60 mL/min   GFR calc Af Amer 9 (L) >60 mL/min    Comment: (NOTE) The eGFR has been calculated using the CKD EPI equation. This calculation has not been validated in all clinical situations. eGFR's persistently <60 mL/min signify possible Chronic Kidney Disease.    Anion gap 17 (H) 5 - 15    Comment: Performed at [facility], [address], [place], [place] [address]  Troponin I     Status: Abnormal   Collection Time: 02/24/18  2:12 PM  Result Value Ref Range   Troponin I 0.03 (HH) <0.03 ng/mL    Comment: CRITICAL RESULT CALLED TO, READ BACK BY AND VERIFIED WITH: CARDWELL,L AT 1500 ON 3.11.2019 BY ISLEY,B Performed at [facility], [address] [place], [place] [address]   Brain natriuretic peptide     Status: Abnormal   Collection Time: 02/24/18  2:12 PM  Result Value Ref Range   B Natriuretic Peptide 109.0 (H) 0.0 - 100.0 pg/mL    Comment: Performed at [facility], [address], [place], [place] [address]  D-dimer, quantitative (not at [facility])     Status: Abnormal   Collection Time: 02/24/18  5:37 PM  Result Value Ref Range   D-Dimer, Quant 1.81 (H) 0.00 - 0.50 ug/mL-FEU    Comment: (NOTE) At the manufacturer cut-off of 0.50 ug/mL FEU, this assay has been documented to exclude PE with a sensitivity and negative predictive value of 97 to 99%.  At this time, this assay has not been approved by the FDA to exclude DVT/VTE. Results should be correlated with clinical presentation. Performed at [facility], [address], [place], [place] [address]   Blood gas, arterial     Status: Abnormal   Collection Time: 02/24/18  6:10 PM  Result Value Ref Range   O2 Content 2.0 L/min   Delivery systems NASAL CANNULA    pH, Arterial 7.513 (H) 7.350 - 7.450   pCO2 arterial 35.4 32.0 - 48.0 mmHg    pO2, Arterial 52.5 (L) 83.0 - 108.0 mmHg   Bicarbonate 29.1 (H) 20.0 - 28.0 mmol/L   Acid-Base Excess 5.1 (H) 0.0 - 2.0 mmol/L   O2 Saturation 87.7 %   Patient temperature  37.0    Collection site RIGHT RADIAL    Drawn by [phone]    Sample type ARTERIAL DRAW    Allens test (pass/fail) PASS PASS    Comment: Performed at [facility], [address], [place], [place] [address]  Glucose, capillary     Status: Abnormal   Collection Time: 02/24/18  6:48 PM  Result Value Ref Range   Glucose-Capillary 134 (H) 65 - 99 mg/dL   Comment 1 Notify RN    Comment 2 Document in Chart   Glucose, capillary     Status: Abnormal   Collection Time: 02/24/18  9:19 PM  Result Value Ref Range   Glucose-Capillary 216 (H) 65 - 99 mg/dL   Comment 1 Notify RN    Comment 2 Document in Chart   MRSA PCR Screening     Status: None   Collection Time: 02/24/18 11:24 PM  Result Value Ref Range   MRSA by PCR NEGATIVE NEGATIVE    Comment:        The GeneXpert MRSA Assay (FDA approved for NASAL specimens only), is one component of a comprehensive MRSA colonization surveillance program. It is not intended to diagnose MRSA infection nor to guide or monitor treatment for MRSA infections. Performed at [facility], [address], [place], [place] [address]   Basic metabolic panel     Status: Abnormal   Collection Time: 02/25/18  5:05 AM  Result Value Ref Range   Sodium 137 135 - 145 mmol/L   Potassium 4.6 3.5 - 5.1 mmol/L    Comment: DELTA CHECK NOTED   Chloride 96 (L) 101 - 111 mmol/L   CO2 24 22 - 32 mmol/L   Glucose, Bld 374 (H) 65 - 99 mg/dL   BUN 33 (H) 6 - 20 mg/dL   Creatinine, Ser 7.80 (H) 0.44 - 1.00 mg/dL   Calcium 9.5 8.9 - 10.3 mg/dL   GFR calc non Af Amer 5 (L) >60 mL/min   GFR calc Af Amer 6 (L) >60 mL/min    Comment: (NOTE) The eGFR has been calculated using the CKD EPI equation. This calculation has not been validated in all clinical situations. eGFR's persistently <60 mL/min signify  possible Chronic Kidney Disease.    Anion gap 17 (H) 5 - 15    Comment: Performed at [facility], [address] [place], [place] [address]  CBC     Status: Abnormal   Collection Time: 02/25/18  5:05 AM  Result Value Ref Range   WBC 9.0 4.0 - 10.5 K/uL   RBC 3.37 (L) 3.87 - 5.11 MIL/uL   Hemoglobin 9.2 (L) 12.0 - 15.0 g/dL   HCT 30.5 (L) 36.0 - 46.0 %   MCV 90.5 78.0 - 100.0 fL   MCH 27.3 26.0 - 34.0 pg   MCHC 30.2 30.0 - 36.0 g/dL   RDW 16.1 (H) 11.5 - 15.5 %   Platelets 270 150 - 400 K/uL    Comment: Performed at [facility], [address], [place], [place] [address]  Glucose, capillary     Status: Abnormal   Collection Time: 02/25/18  7:54 AM  Result Value Ref Range   Glucose-Capillary 383 (H) 65 - 99 mg/dL    Dg Chest 2 View  Result Date: 02/24/2018 CLINICAL DATA:  Shortness of breath on exertion. EXAM: CHEST - 2 VIEW COMPARISON:  Radiographs of November 26, 2017. FINDINGS: Stable cardiomediastinal silhouette and central pulmonary vascular congestion is noted. No pneumothorax or significant pleural effusion is noted. Bony  thorax is unremarkable. Left axillary surgical clips are noted. Possible mild bibasilar pulmonary edema may be present. Bony thorax is unremarkable. IMPRESSION: Stable central pulmonary vascular congestion with possible mild bibasilar pulmonary edema. Electronically Signed   By: Marijo Conception, M.D.   On: 02/24/2018 12:48   Ct Angio Chest Pe W Or Wo Contrast  Result Date: 02/24/2018 CLINICAL DATA:  Shortness of breath. EXAM: CT ANGIOGRAPHY CHEST WITH CONTRAST TECHNIQUE: Multidetector CT imaging of the chest was performed using the standard protocol during bolus administration of intravenous contrast. Multiplanar CT image reconstructions and MIPs were obtained to evaluate the vascular anatomy. CONTRAST:  171m ISOVUE-370 IOPAMIDOL (ISOVUE-370) INJECTION 76% COMPARISON:  Chest x-ray dated 02/24/2018 and CT scans dated 11/26/2017 and 09/25/2017 and 07/21/2017  FINDINGS: Cardiovascular: Satisfactory opacification of the pulmonary arteries to the segmental level. No evidence of pulmonary embolism. Normal heart size. No pericardial effusion. Mediastinum/Nodes: Chronic mediastinal adenopathy, unchanged since 07/21/2017 Lungs/Pleura: Small bilateral pleural effusions with slight interstitial edema at the right lung base and slight bibasilar atelectasis. Bilateral peribronchial thickening consistent with edema. Upper Abdomen: No acute abnormality. Musculoskeletal: Left mastectomy. No acute or significant osseous findings. Review of the MIP images confirms the above findings. IMPRESSION: 1. No pulmonary emboli. 2. Mild interstitial edema at the right lung base. 3. Minimal bilateral pleural effusions with bibasilar atelectasis. Electronically Signed   By: JLorriane ShireM.D.   On: 02/24/2018 17:10    Review of Systems  Constitutional: Negative for chills and fever.  HENT: Positive for congestion.   Respiratory: Positive for cough, sputum production, shortness of breath and wheezing.   Cardiovascular: Positive for orthopnea. Negative for chest pain.  Gastrointestinal: Positive for diarrhea, nausea and vomiting.   Blood pressure (!) 135/54, pulse 91, temperature 98.7 F (37.1 C), temperature source Oral, resp. rate (!) 30, height 5' 3"  (1.6 m), weight 111.2 kg (245 lb 2.4 oz), SpO2 94 %. Physical Exam  Constitutional: She is oriented to person, place, and time. No distress.  Eyes: No scleral icterus.  Neck: No JVD present.  Cardiovascular: Normal rate and regular rhythm.  Respiratory: No respiratory distress. She has no wheezes. She has no rales.  GI: She exhibits no distension. There is no tenderness.  Musculoskeletal: She exhibits no edema.  Neurological: She is alert and oriented to person, place, and time.    Assessment/Plan: 1] difficulty breathing: Etiology at this moment not clear.  Patient with history of sleep apnea/possible bronchitis/fluid  overload.  She has CT scan with contrast yesterday.  She has mild interstitial edema with minimal bilateral pleural effusion but no pulmonary emboli. 2] end-stage renal disease: She is status post hemodialysis yesterday.  Her potassium is normal.  She does not have any uremic signs and symptoms. 3] history of diarrhea: Feeling better 4] hypertension her blood pressure is reasonably controlled 5] anemia: Her hemoglobin is within our target goal 6] right breast CA status post mastectomy 7] obesity Plan: We will make arrangement for patient to get dialysis today for 3 hours and possibly tomorrow which is her regular schedule. 2] will remove about 21-8/3L if systolic blood pressure remains above 90 3] we will check a renal panel and CBC in the morning.   Patricia [name] S 02/25/2018, 8:11 AM

## 2018-02-25 NOTE — Progress Notes (Signed)
PROGRESS NOTE    Patricia [name]  NID:[id number] DOB: [birth year] DOA: 02/24/2018 PCP: Jake Samples, PA-C    Brief Narrative:  47 year old female with end-stage renal disease on hemodialysis, presented to the hospital with shortness of breath and found to have evidence of interstitial edema.  Admitted for further dialysis.  Also complains of vomiting, abdominal pain and diarrhea.  Further workup including CT scan has been requested.   Assessment & Plan:   Principal Problem:   Acute respiratory failure with hypoxia (HCC) Active Problems:   End stage renal disease on dialysis ([place])   Diabetes mellitus ([place])   Essential hypertension   GERD (gastroesophageal reflux disease)   Breast cancer (HCC)   Hypokalemia   Anemia   Elevated troponin   Hypoxemia   1. Acute respiratory failure with hypoxia.  CT angiogram of chest negative for pulmonary embolus.  It did show evidence of interstitial edema.  She is been continued on dialysis.  Will wean off oxygen as tolerated, if she tolerates fluid removal. 2. End-stage renal disease on hemodialysis.  Nephrology following.  She underwent dialysis today on 3/12. 3. Acute on chronic diastolic congestive heart failure.  Related to renal disease.  She has evidence of interstitial edema on chest imaging.  She is undergoing dialysis for fluid removal. 4. Abdominal pain, vomiting and diarrhea.  Patient has an extensive GI history and is followed by gastroenterology as an outpatient.  She is been dealing with chronic nausea as well as constipation for quite some time now.  She has had an extensive workup done.  Last EGD/colonoscopy noted to be in 2017.  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  Her mother reports that for the past several days she has had worsening vomiting and abdominal pain.  She has been receiving antiemetics today without significant benefit.  She describes diffuse abdominal pain.  She also describes some  loose stools, but has been taking amitiza.  Will hold further medications for constipation since she is having loose stools.  Continue antiemetics.  Keep the patient n.p.o. for now.  Will check CT of the abdomen and pelvis since she has significant discomfort, persistent vomiting and persistent abdominal pain. 5. Elevated troponin.  No EKG changes.  No significant chest pain. 6. Diabetes.  Continue on sliding scale insulin.  Blood sugars are stable.  Continue to monitor. 7. Anemia of chronic disease.  Stable and per her baseline.  Continue to monitor. 8. Hypertension.  Currently stable.  Continue home medications 9. GERD.  Continue Protonix   DVT prophylaxis: Heparin Code Status: Full code Family Communication: Discussed with mother at the bedside.  Also discussed with sister over the phone Disposition Plan: Discharge home once shortness of breath and GI issues have been addressed.   Consultants:   Nephrology  Procedures:     Antimicrobials:      Subjective: Complains of left-sided abdominal pain, nausea, vomiting and loose stools.  Patient initially reported that symptoms began earlier today, but mother reports that she has had this for several days.  She appears very uncomfortable due to pain.  Objective: Vitals:   02/25/18 1500 02/25/18 1530 02/25/18 1600 02/25/18 1630  BP: (!) 124/56 (!) 148/45 (!) 114/52 (!) 112/49  Pulse: 84 78 80 82  Resp: 18  17 18   Temp:    98.4 F (36.9 C)  TempSrc:    Oral  SpO2: 92%   96%  Weight:    108.5 kg (239 lb  3.2 oz)  Height:        Intake/Output Summary (Last 24 hours) at 02/25/2018 1745 Last data filed at 02/25/2018 1630 Gross per 24 hour  Intake 969 ml  Output 3350 ml  Net -2381 ml   Filed Weights   02/25/18 0500 02/25/18 1325 02/25/18 1630  Weight: 111.2 kg (245 lb 2.4 oz) 111.2 kg (245 lb 2.4 oz) 108.5 kg (239 lb 3.2 oz)    Examination:  General exam: Patient appears uncomfortable due to abdominal pain Respiratory  system: Clear to auscultation. Respiratory effort normal. Cardiovascular system: S1 & S2 heard, RRR. No JVD, murmurs, rubs, gallops or clicks. No pedal edema. Gastrointestinal system: Abdomen is nondistended, soft and tender on left side, particularly in left lower quadrant. No organomegaly or masses felt. Normal bowel sounds heard. Central nervous system: Alert and oriented. No focal neurological deficits. Extremities: Symmetric 5 x 5 power. Skin: No rashes, lesions or ulcers Psychiatry: Appears very anxious    Data Reviewed: I have personally reviewed following labs and imaging studies  CBC: Recent Labs  Lab 02/24/18 1412 02/25/18 0505  WBC 10.1 9.0  NEUTROABS 7.8*  --   HGB 9.1* 9.2*  HCT 29.3* 30.5*  MCV 88.5 90.5  PLT 252 062   Basic Metabolic Panel: Recent Labs  Lab 02/24/18 1412 02/25/18 0505  NA 138 137  K 3.2* 4.6  CL 97* 96*  CO2 24 24  GLUCOSE 192* 374*  BUN 20 33*  CREATININE 5.65* 7.80*  CALCIUM 9.8 9.5  PHOS  --  5.2*   GFR: Estimated Creatinine Clearance: 10.4 mL/min (A) (by C-G formula based on SCr of 7.8 mg/dL (H)). Liver Function Tests: Recent Labs  Lab 02/24/18 1412  AST 19  ALT 18  ALKPHOS 116  BILITOT 0.9  PROT 8.1  ALBUMIN 4.2   No results for input(s): LIPASE, AMYLASE in the last 168 hours. No results for input(s): AMMONIA in the last 168 hours. Coagulation Profile: No results for input(s): INR, PROTIME in the last 168 hours. Cardiac Enzymes: Recent Labs  Lab 02/24/18 1412  TROPONINI 0.03*   BNP (last 3 results) No results for input(s): PROBNP in the last 8760 hours. HbA1C: No results for input(s): HGBA1C in the last 72 hours. CBG: Recent Labs  Lab 02/24/18 1848 02/24/18 2119 02/25/18 0754 02/25/18 1114 02/25/18 1604  GLUCAP 134* 216* 383* 364* 150*   Lipid Profile: No results for input(s): CHOL, HDL, LDLCALC, TRIG, CHOLHDL, LDLDIRECT in the last 72 hours. Thyroid Function Tests: No results for input(s): TSH,  T4TOTAL, FREET4, T3FREE, THYROIDAB in the last 72 hours. Anemia Panel: No results for input(s): VITAMINB12, FOLATE, FERRITIN, TIBC, IRON, RETICCTPCT in the last 72 hours. Sepsis Labs: No results for input(s): PROCALCITON, LATICACIDVEN in the last 168 hours.  Recent Results (from the past 240 hour(s))  MRSA PCR Screening     Status: None   Collection Time: 02/24/18 11:24 PM  Result Value Ref Range Status   MRSA by PCR NEGATIVE NEGATIVE Final    Comment:        The GeneXpert MRSA Assay (FDA approved for NASAL specimens only), is one component of a comprehensive MRSA colonization surveillance program. It is not intended to diagnose MRSA infection nor to guide or monitor treatment for MRSA infections. Performed at [facility], [address], [place], [place] [address]          Radiology Studies: Dg Chest 2 View  Result Date: 02/24/2018 CLINICAL DATA:  Shortness of breath on exertion. EXAM:  CHEST - 2 VIEW COMPARISON:  Radiographs of November 26, 2017. FINDINGS: Stable cardiomediastinal silhouette and central pulmonary vascular congestion is noted. No pneumothorax or significant pleural effusion is noted. Bony thorax is unremarkable. Left axillary surgical clips are noted. Possible mild bibasilar pulmonary edema may be present. Bony thorax is unremarkable. IMPRESSION: Stable central pulmonary vascular congestion with possible mild bibasilar pulmonary edema. Electronically Signed   By: Marijo Conception, M.D.   On: 02/24/2018 12:48   Ct Angio Chest Pe W Or Wo Contrast  Result Date: 02/24/2018 CLINICAL DATA:  Shortness of breath. EXAM: CT ANGIOGRAPHY CHEST WITH CONTRAST TECHNIQUE: Multidetector CT imaging of the chest was performed using the standard protocol during bolus administration of intravenous contrast. Multiplanar CT image reconstructions and MIPs were obtained to evaluate the vascular anatomy. CONTRAST:  151mL ISOVUE-370 IOPAMIDOL (ISOVUE-370) INJECTION 76% COMPARISON:  Chest  x-ray dated 02/24/2018 and CT scans dated 11/26/2017 and 09/25/2017 and 07/21/2017 FINDINGS: Cardiovascular: Satisfactory opacification of the pulmonary arteries to the segmental level. No evidence of pulmonary embolism. Normal heart size. No pericardial effusion. Mediastinum/Nodes: Chronic mediastinal adenopathy, unchanged since 07/21/2017 Lungs/Pleura: Small bilateral pleural effusions with slight interstitial edema at the right lung base and slight bibasilar atelectasis. Bilateral peribronchial thickening consistent with edema. Upper Abdomen: No acute abnormality. Musculoskeletal: Left mastectomy. No acute or significant osseous findings. Review of the MIP images confirms the above findings. IMPRESSION: 1. No pulmonary emboli. 2. Mild interstitial edema at the right lung base. 3. Minimal bilateral pleural effusions with bibasilar atelectasis. Electronically Signed   By: Lorriane Shire M.D.   On: 02/24/2018 17:10        Scheduled Meds: . cinacalcet  30 mg Oral QPM  . feeding supplement (ENSURE ENLIVE)  237 mL Oral BID BM  . ferric citrate  420 mg Oral BID WC  . fluticasone  2 spray Each Nare Daily  . furosemide  40 mg Oral QHS  . furosemide  80 mg Oral q morning - 10a  . gabapentin  300 mg Oral TID  . guaiFENesin  1,200 mg Oral BID  . heparin  5,000 Units Subcutaneous Q8H  . insulin aspart  0-15 Units Subcutaneous TID WC  . insulin aspart  0-5 Units Subcutaneous QHS  . ipratropium-albuterol  3 mL Nebulization Q6H  . labetalol  200 mg Oral BID  . mouth rinse  15 mL Mouth Rinse BID  . multivitamin  1 tablet Oral Daily  . NIFEdipine  120 mg Oral Daily  . pantoprazole  40 mg Oral Daily  . rOPINIRole  2 mg Oral QHS  . sevelamer carbonate  800 mg Oral BID WC  . sodium chloride flush  3 mL Intravenous Q12H   Continuous Infusions: . sodium chloride    . sodium chloride    . sodium chloride       LOS: 1 day    Time spent: 17mins    Kathie Dike, MD [facility] Pager  [phone]  If 7PM-7AM, please contact night-coverage [email or web address] Password [facility] 02/25/2018, 5:45 PM

## 2018-02-25 NOTE — Progress Notes (Signed)
Initial Nutrition Assessment  DOCUMENTATION CODES:  Morbid obesity  INTERVENTION:  Patient currently w/ intractable vomiting. Once tolerating diet, will add:  Boost Breeze poTID, each supplement provides 250 kcal and 9 grams of protein   30 mL Prostat BID, each supplement provides 100 kcal and 15 grams of protein.  NUTRITION DIAGNOSIS:  Inadequate oral intake related to nausea, vomiting, acute illness as evidenced by an estimated oral intake that has met </= to 50% of needs for >/= 5 days  GOAL:  Patient will meet greater than or equal to 90% of their needs  MONITOR:  PO intake, Supplement acceptance, Labs, Weight trends  REASON FOR ASSESSMENT:  Malnutrition Screening Tool    ASSESSMENT:  47 y/o female PMHx DM2, HTN, Breast cancer s/p mastectomy in remission, ESRD on HD (MWF). Presents with n/v/d x1 week, that had been improving, as well as SOB x2-3 days w/ cough. In ED, patient found to have hypoxia. Admitted for management.   Pt says that she says she began to develop GI symptoms a little over a week ago. One of her many providers asked to her to follow the BRAT diet + pasta as this has resolved similar issues she has had in past.  She estimates her PO intake for the past week has been "less than half" of what I usually eat. She says that her symptoms did start to improve.   At baseline, she follows a renal diet. She jokes she has been on the [employer]" at her HD center due to her good lab values. She takes multiple binders and a renal mvi.   She says her dry weight at her HD center is ~110 kg. Per chart, her weight has been stable between ~240-250lbs  (109-114 kg) x1 year.  Though her current weight does not indicate any weight loss, she feels certain she has lost weight "because I havent been eating"  Starting today, the patient has had repeated episodes of severe vomiting. She has been unable to tolerate PO intake, breathing treatments or sips of water. She gives vague  descriptions of radiating pain in her lower abdomen/chest. She says she does not feel good at all and that "something isnt right". She is scared to eat anything.   She was agreeable to trying supplements when her diet tolerance improve. Given her severe nausea, will avoid higher fat supplements. Will add Breeze/prostat. Her BGs only increased today, likely due to persistent severe vomiting, steroids. Her last A1C was well below prediabetic range.   Physical Exam: No discernible fat/muscle wasting  Currently does not meet malnutrition criteria, though she is undergoing HD at this time and will also be undergoing HD tomorrow. Potentially weight will drop to level of significance. She noted her nephrologist is going to "drop my dry weight", since he knows I have lost true weight.  Labs: H/H:9.2/30.5, K:4.6, BG today: 360-390. A1C last October 5.3 Meds: Ensure, Lasix, Insulin, Amitiza, Auryxia Rena-vit, prednisone, ppi, Renvela, insulin, Sensipar.  Recent Labs  Lab 02/24/18 1412 02/25/18 0505  NA 138 137  K 3.2* 4.6  CL 97* 96*  CO2 24 24  BUN 20 33*  CREATININE 5.65* 7.80*  CALCIUM 9.8 9.5  PHOS  --  5.2*  GLUCOSE 192* 374*   NUTRITION - FOCUSED PHYSICAL EXAM: WDL, no discernible muscle fat wasting  Diet Order:  Diet heart healthy/carb modified Room service appropriate? Yes; Fluid consistency: Thin; Fluid restriction: 1500 mL Fluid  EDUCATION NEEDS:  No education needs have been identified at  this time  Skin:  Skin Assessment: Reviewed RN Assessment  Last BM:  3/12  Height:  Ht Readings from Last 1 Encounters:  02/24/18 5' 3"  (1.6 m)   Weight:  Wt Readings from Last 1 Encounters:  02/25/18 245 lb 2.4 oz (111.2 kg)   Wt Readings from Last 10 Encounters:  02/25/18 245 lb 2.4 oz (111.2 kg)  11/28/17 245 lb 9.6 oz (111.4 kg)  09/30/17 240 lb 8.4 oz (109.1 kg)  07/24/17 242 lb 11.6 oz (110.1 kg)  03/05/17 249 lb (112.9 kg)  10/25/16 223 lb 3.2 oz (101.2 kg)  09/17/16 240  lb (108.9 kg)  09/11/16 240 lb (108.9 kg)  08/02/16 240 lb 6.4 oz (109 kg)  07/11/16 236 lb 1.8 oz (107.1 kg)   Ideal Body Weight:  52.27 kg  BMI:  Body mass index is 43.43 kg/m.  Standard Body Weight for height 55 kg Estimated Nutritional Needs:  Kcal:  1825-2025 kcals (35 kcal +/-100) Protein:  77-88 (1.4-1.6 g/kg sbw) Fluid:  1.5 L (Per Md fluid restriction)  Burtis Junes RD, LDN, CNSC Clinical Nutrition Pager: [phone] 02/25/2018 4:10 PM

## 2018-02-26 DIAGNOSIS — R748 Abnormal levels of other serum enzymes: Secondary | ICD-10-CM

## 2018-02-26 DIAGNOSIS — K529 Noninfective gastroenteritis and colitis, unspecified: Secondary | ICD-10-CM | POA: Diagnosis present

## 2018-02-26 LAB — GLUCOSE, CAPILLARY
GLUCOSE-CAPILLARY: 97 mg/dL (ref 65–99)
Glucose-Capillary: 101 mg/dL — ABNORMAL HIGH (ref 65–99)
Glucose-Capillary: 123 mg/dL — ABNORMAL HIGH (ref 65–99)
Glucose-Capillary: 107 mg/dL — ABNORMAL HIGH (ref 65–99)

## 2018-02-26 LAB — RENAL FUNCTION PANEL
ALBUMIN: 4 g/dL (ref 3.5–5.0)
ANION GAP: 16 — AB (ref 5–15)
BUN: 33 mg/dL — ABNORMAL HIGH (ref 6–20)
CALCIUM: 9.3 mg/dL (ref 8.9–10.3)
CO2: 25 mmol/L (ref 22–32)
Chloride: 97 mmol/L — ABNORMAL LOW (ref 101–111)
Creatinine, Ser: 8.06 mg/dL — ABNORMAL HIGH (ref 0.44–1.00)
GFR calc non Af Amer: 5 mL/min — ABNORMAL LOW (ref >60)
GFR, EST AFRICAN AMERICAN: 6 mL/min — AB (ref >60)
GLUCOSE: 115 mg/dL — AB (ref 65–99)
PHOSPHORUS: 6.3 mg/dL — AB (ref 2.5–4.6)
Potassium: 4.2 mmol/L (ref 3.5–5.1)
SODIUM: 138 mmol/L (ref 135–145)

## 2018-02-26 LAB — CBC
HCT: 29.9 % — ABNORMAL LOW (ref 36.0–46.0)
HEMOGLOBIN: 9 g/dL — AB (ref 12.0–15.0)
MCH: 27.5 pg (ref 26.0–34.0)
MCHC: 30.1 g/dL (ref 30.0–36.0)
MCV: 91.4 fL (ref 78.0–100.0)
PLATELETS: 282 10*3/uL (ref 150–400)
RBC: 3.27 MIL/uL — ABNORMAL LOW (ref 3.87–5.11)
RDW: 15.8 % — ABNORMAL HIGH (ref 11.5–15.5)
WBC: 14.6 10*3/uL — ABNORMAL HIGH (ref 4.0–10.5)

## 2018-02-26 LAB — HEPATITIS B SURFACE ANTIGEN
HEP B S AG: NEGATIVE
HEP B S AG: NEGATIVE

## 2018-02-26 MED ORDER — LIDOCAINE HCL (PF) 1 % IJ SOLN: 5.0000 mL | INTRAMUSCULAR | Status: DC | PRN

## 2018-02-26 MED ORDER — EPOETIN ALFA 4000 UNIT/ML IJ SOLN
4000.0000 [IU] | INTRAMUSCULAR | Status: DC
  Administered 2018-02-26 – 2018-02-28 (×2): 4000 [IU] via SUBCUTANEOUS
  Filled 2018-02-26: qty 1

## 2018-02-26 MED ORDER — LIDOCAINE-PRILOCAINE 2.5-2.5 % EX CREA: 1.0000 "application " | TOPICAL_CREAM | CUTANEOUS | Status: DC | PRN

## 2018-02-26 MED ORDER — CIPROFLOXACIN IN D5W 400 MG/200ML IV SOLN
400.0000 mg | INTRAVENOUS | Status: DC
  Administered 2018-02-26 – 2018-02-27 (×2): 400 mg via INTRAVENOUS
  Filled 2018-02-26 (×2): qty 200

## 2018-02-26 MED ORDER — SODIUM CHLORIDE 0.9 % IV SOLN: 100.0000 mL | INTRAVENOUS | Status: DC | PRN

## 2018-02-26 MED ORDER — HEPARIN SODIUM (PORCINE) 1000 UNIT/ML DIALYSIS: 1000.0000 [IU] | INTRAMUSCULAR | Status: DC | PRN

## 2018-02-26 MED ORDER — ALTEPLASE 2 MG IJ SOLR: 2.0000 mg | Freq: Once | INTRAMUSCULAR | Status: DC | PRN

## 2018-02-26 MED ORDER — METRONIDAZOLE IN NACL 5-0.79 MG/ML-% IV SOLN
500.0000 mg | Freq: Three times a day (TID) | INTRAVENOUS | Status: DC
  Administered 2018-02-26 – 2018-02-28 (×8): 500 mg via INTRAVENOUS
  Filled 2018-02-26 (×8): qty 100

## 2018-02-26 MED ORDER — PENTAFLUOROPROP-TETRAFLUOROETH EX AERO: 1.0000 "application " | INHALATION_SPRAY | CUTANEOUS | Status: DC | PRN

## 2018-02-26 MED ORDER — CIPROFLOXACIN IN D5W 400 MG/200ML IV SOLN
400.0000 mg | Freq: Once | INTRAVENOUS | Status: AC
  Administered 2018-02-26: 400 mg via INTRAVENOUS
  Filled 2018-02-26: qty 200

## 2018-02-26 NOTE — Progress Notes (Signed)
ANTIBIOTIC CONSULT NOTE-Preliminary  Pharmacy Consult for ciprofloxacin Indication: intra-abdominal infection  Allergies  Allergen Reactions  . Reglan [Metoclopramide] Other (See Comments)    hallucinations hallucinations  . Amlodipine Besylate Rash and Other (See Comments)    dizziness    Patient Measurements: Height: 5\' 3"  (160 cm) Weight: 239 lb 3.2 oz (108.5 kg) IBW/kg (Calculated) : 52.4 Adjusted Body Weight:   Vital Signs: Temp: 98.2 F (36.8 C) (03/12 2045) Temp Source: Oral (03/12 2045) BP: 148/73 (03/12 2045) Pulse Rate: 80 (03/12 2045)  Labs: Recent Labs    02/24/18 1412 02/25/18 0505  WBC 10.1 9.0  HGB 9.1* 9.2*  PLT 252 270  CREATININE 5.65* 7.80*    Estimated Creatinine Clearance: 10.4 mL/min (A) (by C-G formula based on SCr of 7.8 mg/dL (H)).  No results for input(s): VANCOTROUGH, VANCOPEAK, VANCORANDOM, GENTTROUGH, GENTPEAK, GENTRANDOM, TOBRATROUGH, TOBRAPEAK, TOBRARND, AMIKACINPEAK, AMIKACINTROU, AMIKACIN in the last 72 hours.   Microbiology: Recent Results (from the past 720 hour(s))  MRSA PCR Screening     Status: None   Collection Time: 02/24/18 11:24 PM  Result Value Ref Range Status   MRSA by PCR NEGATIVE NEGATIVE Final    Comment:        The GeneXpert MRSA Assay (FDA approved for NASAL specimens only), is one component of a comprehensive MRSA colonization surveillance program. It is not intended to diagnose MRSA infection nor to guide or monitor treatment for MRSA infections. Performed at [facility], [address] [place], [place] [address]     Medical History: Past Medical History:  Diagnosis Date  . Blood transfusion without reported diagnosis   . Cancer ([place])   . Diabetes mellitus without complication ([place])   . Dialysis patient ([place])    mon, wed, friday,   . Hypertension   . OSA (obstructive sleep apnea)   . Renal disorder   . Renal insufficiency     Medications:  Infusions:  . sodium chloride    . sodium  chloride    . sodium chloride    . metronidazole 500 mg (02/26/18 0315)   PRN: sodium chloride, sodium chloride, sodium chloride, acetaminophen, alteplase, guaiFENesin-dextromethorphan, heparin, lidocaine (PF), lidocaine-prilocaine, morphine injection, ondansetron **OR** ondansetron (ZOFRAN) IV, ondansetron, pentafluoroprop-tetrafluoroeth, promethazine, sevelamer carbonate, sodium chloride flush Anti-infectives (From admission, onward)   Start     Dose/Rate Route Frequency Ordered Stop   02/26/18 0200  ciprofloxacin (CIPRO) IVPB 400 mg     400 mg 200 mL/hr over 60 Minutes Intravenous  Once 02/26/18 0145 02/26/18 0315   02/26/18 0145  metroNIDAZOLE (FLAGYL) IVPB 500 mg     500 mg 100 mL/hr over 60 Minutes Intravenous Every 8 hours 02/26/18 0134        Assessment: 47 yo female with ESRD on hemodialysis.  Admitted for acute respiratory failure.  Has had chronic nausea and constipation. Abdominal CT showed colitis. Starting ciprofloxacin for intra-abdominal infection.    Plan:  Preliminary review of pertinent patient information completed.  Protocol will be initiated with dose(s) of ciprofloxacin 400mg  IV x 1.  Forestine Na clinical pharmacist will complete review during morning rounds to assess patient and finalize treatment regimen if needed.  Nyra Capes, [facility] 02/26/2018,4:42 AM

## 2018-02-26 NOTE — Progress Notes (Signed)
ANTIBIOTIC CONSULT NOTE  Pharmacy Consult for ciprofloxacin Indication: intra-abdominal infection  Allergies  Allergen Reactions  . Reglan [Metoclopramide] Other (See Comments)    hallucinations hallucinations  . Amlodipine Besylate Rash and Other (See Comments)    dizziness   Patient Measurements: Height: 5\' 3"  (160 cm) Weight: 242 lb 8.1 oz (110 kg) IBW/kg (Calculated) : 52.4  Vital Signs: Temp: 98.4 F (36.9 C) (03/13 0455) Temp Source: Oral (03/13 0455) BP: 127/62 (03/13 0455) Pulse Rate: 80 (03/13 0455)  Labs: Recent Labs    02/24/18 1412 02/25/18 0505 02/26/18 0523  WBC 10.1 9.0 14.6*  HGB 9.1* 9.2* 9.0*  PLT 252 270 282  CREATININE 5.65* 7.80* 8.06*   Estimated Creatinine Clearance: 10.2 mL/min (A) (by C-G formula based on SCr of 8.06 mg/dL (H)).  No results for input(s): VANCOTROUGH, VANCOPEAK, VANCORANDOM, GENTTROUGH, GENTPEAK, GENTRANDOM, TOBRATROUGH, TOBRAPEAK, TOBRARND, AMIKACINPEAK, AMIKACINTROU, AMIKACIN in the last 72 hours.   Microbiology: Recent Results (from the past 720 hour(s))  MRSA PCR Screening     Status: None   Collection Time: 02/24/18 11:24 PM  Result Value Ref Range Status   MRSA by PCR NEGATIVE NEGATIVE Final    Comment:        The GeneXpert MRSA Assay (FDA approved for NASAL specimens only), is one component of a comprehensive MRSA colonization surveillance program. It is not intended to diagnose MRSA infection nor to guide or monitor treatment for MRSA infections. Performed at [facility], [address] [place], [place] [address]    Medical History: Past Medical History:  Diagnosis Date  . Blood transfusion without reported diagnosis   . Cancer ([place])   . Diabetes mellitus without complication ([place])   . Dialysis patient ([place])    mon, wed, friday,   . Hypertension   . OSA (obstructive sleep apnea)   . Renal disorder   . Renal insufficiency    Medications:  Infusions:  . sodium chloride    . sodium chloride    .  sodium chloride    . ciprofloxacin    . metronidazole Stopped (02/26/18 0415)   PRN: sodium chloride, sodium chloride, sodium chloride, acetaminophen, alteplase, guaiFENesin-dextromethorphan, heparin, lidocaine (PF), lidocaine-prilocaine, morphine injection, ondansetron **OR** ondansetron (ZOFRAN) IV, ondansetron, pentafluoroprop-tetrafluoroeth, promethazine, sevelamer carbonate, sodium chloride flush Anti-infectives (From admission, onward)   Start     Dose/Rate Route Frequency Ordered Stop   02/26/18 2200  ciprofloxacin (CIPRO) IVPB 400 mg     400 mg 200 mL/hr over 60 Minutes Intravenous Every 24 hours 02/26/18 0832     02/26/18 0200  ciprofloxacin (CIPRO) IVPB 400 mg     400 mg 200 mL/hr over 60 Minutes Intravenous  Once 02/26/18 0145 02/26/18 0315   02/26/18 0145  metroNIDAZOLE (FLAGYL) IVPB 500 mg     500 mg 100 mL/hr over 60 Minutes Intravenous Every 8 hours 02/26/18 0134       Assessment: 47 yo female with ESRD on hemodialysis.  Admitted for acute respiratory failure.  Has had chronic nausea and constipation. Abdominal CT showed colitis. Starting ciprofloxacin for intra-abdominal infection.   Plan:  Cipro 400mg  IV q24hrs Monitor labs, progress, c/s  Ena Dawley, [facility] 02/26/2018,8:32 AM

## 2018-02-26 NOTE — Progress Notes (Signed)
Khadejah Son  MRN: [id number]  DOB/AGE: 1971-06-25 47 y.o.  Primary Care Physician:[facility], Hulen Shouts, PA-C  Admit date: 02/24/2018  Chief Complaint:  Chief Complaint  Patient presents with  . Shortness of Breath    S-Pt presented on  02/24/2018 with  Chief Complaint  Patient presents with  . Shortness of Breath  .     Pt says " they found something in my belly, now I have started back eating"     Pt today feels better.   Pt offers no new complaints.    Meds . cinacalcet  30 mg Oral QPM  . feeding supplement (ENSURE ENLIVE)  237 mL Oral BID BM  . ferric citrate  420 mg Oral BID WC  . fluticasone  2 spray Each Nare Daily  . furosemide  40 mg Oral QHS  . furosemide  80 mg Oral q morning - 10a  . gabapentin  300 mg Oral TID  . guaiFENesin  1,200 mg Oral BID  . heparin  5,000 Units Subcutaneous Q8H  . insulin aspart  0-15 Units Subcutaneous TID WC  . insulin aspart  0-5 Units Subcutaneous QHS  . ipratropium-albuterol  3 mL Nebulization Q6H  . labetalol  200 mg Oral BID  . mouth rinse  15 mL Mouth Rinse BID  . multivitamin  1 tablet Oral Daily  . NIFEdipine  120 mg Oral Daily  . pantoprazole  40 mg Oral Daily  . rOPINIRole  2 mg Oral QHS  . sevelamer carbonate  800 mg Oral BID WC  . sodium chloride flush  3 mL Intravenous Q12H     Physical Exam: Vital signs in last 24 hours: Temp:  [97.4 F (36.3 C)-98.4 F (36.9 C)] 98.4 F (36.9 C) (03/13 0455) Pulse Rate:  [78-85] 80 (03/13 0455) Resp:  [17-19] 18 (03/12 1630) BP: (112-148)/(45-73) 127/62 (03/13 0455) SpO2:  [90 %-97 %] 96 % (03/13 0455) Weight:  [239 lb 3.2 oz (108.5 kg)-245 lb 2.4 oz (111.2 kg)] 242 lb 8.1 oz (110 kg) (03/13 0455) Weight change: -11.6 oz (-3.503 kg) Last BM Date: 02/25/18  Intake/Output from previous day: 03/12 0701 - 03/13 0700 In: 603 [P.O.:600; I.V.:3] Out: 3350 [Urine:850] No intake/output data recorded.   Physical Exam: General- pt is awake,alert, oriented to time place and  person Resp- No acute REsp distress,  Chest is clear to auscultation. CVS- S1S2 regular in rate and rhythm GIT- BS+, soft, NT, ND EXT- NO LE Edema, NO Cyanosis Access- Left AVF + bruit  Lab Results: CBC Recent Labs    02/25/18 0505 02/26/18 0523  WBC 9.0 14.6*  HGB 9.2* 9.0*  HCT 30.5* 29.9*  PLT 270 282    BMET Recent Labs    02/24/18 1412 02/25/18 0505  NA 138 137  K 3.2* 4.6  CL 97* 96*  CO2 24 24  GLUCOSE 192* 374*  BUN 20 33*  CREATININE 5.65* 7.80*  CALCIUM 9.8 9.5    MICRO Recent Results (from the past 240 hour(s))  MRSA PCR Screening     Status: None   Collection Time: 02/24/18 11:24 PM  Result Value Ref Range Status   MRSA by PCR NEGATIVE NEGATIVE Final    Comment:        The GeneXpert MRSA Assay (FDA approved for NASAL specimens only), is one component of a comprehensive MRSA colonization surveillance program. It is not intended to diagnose MRSA infection nor to guide or monitor treatment for MRSA infections. Performed at [facility], [phone]  [address], [place], [place] [address]       Lab Results  Component Value Date   CALCIUM 9.5 02/25/2018   CAION 0.97 (L) 09/27/2016   PHOS 5.2 (H) 02/25/2018          Impression: 1)Renal  ESRD on HD                Pt is on Mon/Wed/Fri schedule                Pt was dialyzed yesterday                Will dialyze today  2)HTN   BP  at goal          Medication-  On Diuretics-Lasix On Calcium Channel Blockers On Alpha and beta Blockers    3)Anemia IN ESRD the goal for  HGb 9--11 HGb is not at goal    4)CKD Mineral-Bone Disorder  Phosphorus at goal. Calcium is  at goal.  5)Resp - admitted with Dyspnea sec to fluid overload Now clinically better   6)Electrolytes  Normokalemic normonatremic   7)Acid base Co2 at goal  8) GIa- admitted with colitis  now on cipro Clinically better    Plan:  Will dialyze pt to as this is her scheduled day  Will use 4k  bath Will keep on epo   Shallon Yaklin S 02/26/2018, 5:41 AM

## 2018-02-26 NOTE — Progress Notes (Signed)
All meds have been held for patient. Pt complaining of intolerable abdominal pain and nausea. MD has been notified and CT scan ordered and patient NPO. Patient and her mother have been informed of plan of care. Will continue to monitor patient.

## 2018-02-26 NOTE — Progress Notes (Signed)
PROGRESS NOTE    Patricia [name]  XBM:[id number] DOB: 05/19/1971 DOA: 02/24/2018 PCP: Jake Samples, PA-C    Brief Narrative:  47 year old female with end-stage renal disease on hemodialysis, presented to the hospital with shortness of breath and found to have evidence of interstitial edema.  Admitted for further dialysis.  Also complains of vomiting, abdominal pain and diarrhea.  Further workup including CT scan has been requested.   Assessment & Plan:   Principal Problem:   Acute respiratory failure with hypoxia (HCC) Active Problems:   End stage renal disease on dialysis ([place])   Diabetes mellitus ([place])   Essential hypertension   GERD (gastroesophageal reflux disease)   Breast cancer (HCC)   Hypokalemia   Anemia   Elevated troponin   Hypoxemia   1. Acute respiratory failure with hypoxia.  CT angiogram of chest negative for pulmonary embolus.  It did show evidence of interstitial edema.  She is been continued on dialysis.  Will wean off oxygen as tolerated, if she tolerates fluid removal. 2. End-stage renal disease on hemodialysis.  Nephrology following.  She underwent dialysis today on 3/12 and 3/13. 3. Acute on chronic diastolic congestive heart failure.  Related to renal disease.  She has evidence of interstitial edema on chest imaging.  She is undergoing dialysis for fluid removal. 4. Acute colitis, suspect infectious.  Started on ciprofloxacin and Flagyl.  She is not having diarrhea right now, will hold off on further stool studies.  Abdominal pain slowly improving.  Currently n.p.o., but she wants to advance her diet.  Will start on clear liquids. 5. Elevated troponin.  No EKG changes.  No significant chest pain. 6. Diabetes.  Continue on sliding scale insulin.  Blood sugars are stable.  Continue to monitor. 7. Anemia of chronic disease.  Stable and per her baseline.  Continue to monitor. 8. Hypertension.  Currently stable.  Continue home medications 9. GERD.  Continue  Protonix   DVT prophylaxis: Heparin Code Status: Full code Family Communication: Discussed with mother at the bedside.  Disposition Plan: Discharge home once shortness of breath and GI issues are improving.   Consultants:   Nephrology  Procedures:     Antimicrobials:   Ciprofloxacin 3/13 >  Metronidazole 3/13 >   Subjective: Still having abdominal pain.  No loose stools.  No nausea or vomiting.  Shortness of breath is better.  Objective: Vitals:   02/26/18 0455 02/26/18 0745 02/26/18 0936 02/26/18 1245  BP: 127/62  (!) 119/58 (!) 101/33  Pulse: 80   79  Resp:    16  Temp: 98.4 F (36.9 C)   98.2 F (36.8 C)  TempSrc: Oral   Oral  SpO2: 96% 91%  95%  Weight: 110 kg (242 lb 8.1 oz)   109.2 kg (240 lb 11.9 oz)  Height:        Intake/Output Summary (Last 24 hours) at 02/26/2018 1340 Last data filed at 02/26/2018 1100 Gross per 24 hour  Intake 343 ml  Output 2500 ml  Net -2157 ml   Filed Weights   02/25/18 1630 02/26/18 0455 02/26/18 1245  Weight: 108.5 kg (239 lb 3.2 oz) 110 kg (242 lb 8.1 oz) 109.2 kg (240 lb 11.9 oz)    Examination:  General exam: Alert, awake, oriented x 3 Respiratory system: Clear to auscultation. Respiratory effort normal. Cardiovascular system:RRR. No murmurs, rubs, gallops. Gastrointestinal system: Abdomen is nondistended, soft and tender in lower abdomen. No organomegaly or masses felt. Normal bowel sounds heard. Central nervous system: Alert  and oriented. No focal neurological deficits. Extremities: No C/C/E, +pedal pulses Skin: No rashes, lesions or ulcers Psychiatry: Judgement and insight appear normal. Mood & affect appropriate.   Data Reviewed: I have personally reviewed following labs and imaging studies  CBC: Recent Labs  Lab 02/24/18 1412 02/25/18 0505 02/26/18 0523  WBC 10.1 9.0 14.6*  NEUTROABS 7.8*  --   --   HGB 9.1* 9.2* 9.0*  HCT 29.3* 30.5* 29.9*  MCV 88.5 90.5 91.4  PLT 252 270 301   Basic Metabolic  Panel: Recent Labs  Lab 02/24/18 1412 02/25/18 0505 02/26/18 0523  NA 138 137 138  K 3.2* 4.6 4.2  CL 97* 96* 97*  CO2 24 24 25   GLUCOSE 192* 374* 115*  BUN 20 33* 33*  CREATININE 5.65* 7.80* 8.06*  CALCIUM 9.8 9.5 9.3  PHOS  --  5.2* 6.3*   GFR: Estimated Creatinine Clearance: 10.1 mL/min (A) (by C-G formula based on SCr of 8.06 mg/dL (H)). Liver Function Tests: Recent Labs  Lab 02/24/18 1412 02/26/18 0523  AST 19  --   ALT 18  --   ALKPHOS 116  --   BILITOT 0.9  --   PROT 8.1  --   ALBUMIN 4.2 4.0   No results for input(s): LIPASE, AMYLASE in the last 168 hours. No results for input(s): AMMONIA in the last 168 hours. Coagulation Profile: No results for input(s): INR, PROTIME in the last 168 hours. Cardiac Enzymes: Recent Labs  Lab 02/24/18 1412  TROPONINI 0.03*   BNP (last 3 results) No results for input(s): PROBNP in the last 8760 hours. HbA1C: No results for input(s): HGBA1C in the last 72 hours. CBG: Recent Labs  Lab 02/25/18 1114 02/25/18 1604 02/25/18 2047 02/26/18 0726 02/26/18 1122  GLUCAP 364* 150* 143* 101* 123*   Lipid Profile: No results for input(s): CHOL, HDL, LDLCALC, TRIG, CHOLHDL, LDLDIRECT in the last 72 hours. Thyroid Function Tests: No results for input(s): TSH, T4TOTAL, FREET4, T3FREE, THYROIDAB in the last 72 hours. Anemia Panel: No results for input(s): VITAMINB12, FOLATE, FERRITIN, TIBC, IRON, RETICCTPCT in the last 72 hours. Sepsis Labs: No results for input(s): PROCALCITON, LATICACIDVEN in the last 168 hours.  Recent Results (from the past 240 hour(s))  MRSA PCR Screening     Status: None   Collection Time: 02/24/18 11:24 PM  Result Value Ref Range Status   MRSA by PCR NEGATIVE NEGATIVE Final    Comment:        The GeneXpert MRSA Assay (FDA approved for NASAL specimens only), is one component of a comprehensive MRSA colonization surveillance program. It is not intended to diagnose MRSA infection nor to guide  or monitor treatment for MRSA infections. Performed at [facility], [address], [place],  [address]          Radiology Studies: Ct Angio Chest Pe W Or Wo Contrast  Result Date: 02/24/2018 CLINICAL DATA:  Shortness of breath. EXAM: CT ANGIOGRAPHY CHEST WITH CONTRAST TECHNIQUE: Multidetector CT imaging of the chest was performed using the standard protocol during bolus administration of intravenous contrast. Multiplanar CT image reconstructions and MIPs were obtained to evaluate the vascular anatomy. CONTRAST:  174mL ISOVUE-370 IOPAMIDOL (ISOVUE-370) INJECTION 76% COMPARISON:  Chest x-ray dated 02/24/2018 and CT scans dated 11/26/2017 and 09/25/2017 and 07/21/2017 FINDINGS: Cardiovascular: Satisfactory opacification of the pulmonary arteries to the segmental level. No evidence of pulmonary embolism. Normal heart size. No pericardial effusion. Mediastinum/Nodes: Chronic mediastinal adenopathy, unchanged since 07/21/2017 Lungs/Pleura: Small bilateral pleural effusions with  slight interstitial edema at the right lung base and slight bibasilar atelectasis. Bilateral peribronchial thickening consistent with edema. Upper Abdomen: No acute abnormality. Musculoskeletal: Left mastectomy. No acute or significant osseous findings. Review of the MIP images confirms the above findings. IMPRESSION: 1. No pulmonary emboli. 2. Mild interstitial edema at the right lung base. 3. Minimal bilateral pleural effusions with bibasilar atelectasis. Electronically Signed   By: Lorriane Shire M.D.   On: 02/24/2018 17:10   Ct Abdomen Pelvis W Contrast  Result Date: 02/25/2018 CLINICAL DATA:  47 year old female with abdominal pain. EXAM: CT ABDOMEN AND PELVIS WITH CONTRAST TECHNIQUE: Multidetector CT imaging of the abdomen and pelvis was performed using the standard protocol following bolus administration of intravenous contrast. CONTRAST:  157mL ISOVUE-300 IOPAMIDOL (ISOVUE-300) INJECTION 61% COMPARISON:  Abdominal  CT dated 09/14/2016 FINDINGS: Lower chest: Bilateral lower lobe patchy and linear densities may represent atelectasis/scarring versus pneumonia. Clinical correlation is recommended. There is mild cardiomegaly. No intra-abdominal free air or free fluid. Hepatobiliary: Cholecystectomy. The liver is unremarkable. No intrahepatic biliary ductal dilatation. Pancreas: Unremarkable. No pancreatic ductal dilatation or surrounding inflammatory changes. Spleen: Normal in size without focal abnormality. Adrenals/Urinary Tract: The adrenal glands are unremarkable. There is mild bilateral renal parenchyma atrophy. High attenuating content in the renal collecting system likely excreted contrast. There is no hydronephrosis on either side. The visualized ureters appear unremarkable. There is minimal distention of the urinary bladder with excreted intravenous contrast. There is apparent diffuse thickening of the bladder wall which may be partly related to underdistention. Cystitis is not excluded. Correlation with urinalysis recommended. Stomach/Bowel: There is thickened appearance of the: Primarily involving the transverse, descending, and rectosigmoid most consistent with colitis. Correlation with clinical exam and stool cultures recommended. There is no bowel obstruction. The appendix is not visualized with certainty. No inflammatory changes identified in the right lower quadrant. Vascular/Lymphatic: Mild aortoiliac atherosclerotic disease. No portal venous gas. There is no adenopathy. Reproductive: Hysterectomy. Stable appearance of the ovaries. No pelvic mass. Other: There is a small fat containing umbilical hernia. There is slight induration of the herniated fat which may represent mild inflammation. There is diastases recti in the anterior pelvic wall with protrusion of small amount of omental fat. No fluid collection or inflammatory changes. Musculoskeletal: Mild degenerative changes of the spine. No acute osseous pathology.  IMPRESSION: 1. Colitis. Correlation with clinical exam and stool cultures recommended. No bowel obstruction. 2. Thickened appearance of the bladder wall likely related to underdistention. Clinical correlation is recommended. 3. Atrophic kidneys. 4. Bibasilar atelectasis/scarring. Pneumonia is less likely. Clinical correlation is recommended. 5. Cardiomegaly. Electronically Signed   By: Anner Crete M.D.   On: 02/25/2018 20:52        Scheduled Meds: . cinacalcet  30 mg Oral QPM  . epoetin (EPOGEN/PROCRIT) injection  4,000 Units Subcutaneous Q M,W,F-HD  . feeding supplement (ENSURE ENLIVE)  237 mL Oral BID BM  . ferric citrate  420 mg Oral BID WC  . fluticasone  2 spray Each Nare Daily  . furosemide  40 mg Oral QHS  . furosemide  80 mg Oral q morning - 10a  . gabapentin  300 mg Oral TID  . guaiFENesin  1,200 mg Oral BID  . heparin  5,000 Units Subcutaneous Q8H  . insulin aspart  0-15 Units Subcutaneous TID WC  . insulin aspart  0-5 Units Subcutaneous QHS  . ipratropium-albuterol  3 mL Nebulization Q6H  . labetalol  200 mg Oral BID  . mouth rinse  15 mL Mouth  Rinse BID  . multivitamin  1 tablet Oral Daily  . NIFEdipine  120 mg Oral Daily  . pantoprazole  40 mg Oral Daily  . rOPINIRole  2 mg Oral QHS  . sevelamer carbonate  800 mg Oral BID WC  . sodium chloride flush  3 mL Intravenous Q12H   Continuous Infusions: . sodium chloride    . sodium chloride    . sodium chloride    . sodium chloride    . sodium chloride    . ciprofloxacin    . metronidazole Stopped (02/26/18 1037)     LOS: 2 days    Time spent: 56mins    Kathie Dike, MD [facility] Pager [phone]  If 7PM-7AM, please contact night-coverage [email or web address] Password [email or web address] 02/26/2018, 1:40 PM

## 2018-02-27 LAB — CBC
HEMATOCRIT: 31.4 % — AB (ref 36.0–46.0)
HEMOGLOBIN: 9.4 g/dL — AB (ref 12.0–15.0)
MCH: 27.5 pg (ref 26.0–34.0)
MCHC: 29.9 g/dL — AB (ref 30.0–36.0)
MCV: 91.8 fL (ref 78.0–100.0)
Platelets: 296 10*3/uL (ref 150–400)
RBC: 3.42 MIL/uL — ABNORMAL LOW (ref 3.87–5.11)
RDW: 16.2 % — ABNORMAL HIGH (ref 11.5–15.5)
WBC: 12.8 10*3/uL — ABNORMAL HIGH (ref 4.0–10.5)

## 2018-02-27 LAB — BASIC METABOLIC PANEL
Anion gap: 17 — ABNORMAL HIGH (ref 5–15)
BUN: 28 mg/dL — AB (ref 6–20)
CHLORIDE: 94 mmol/L — AB (ref 101–111)
CO2: 25 mmol/L (ref 22–32)
CREATININE: 7.81 mg/dL — AB (ref 0.44–1.00)
Calcium: 8.8 mg/dL — ABNORMAL LOW (ref 8.9–10.3)
GFR calc Af Amer: 6 mL/min — ABNORMAL LOW (ref >60)
GFR calc non Af Amer: 5 mL/min — ABNORMAL LOW (ref >60)
Glucose, Bld: 151 mg/dL — ABNORMAL HIGH (ref 65–99)
Potassium: 4.2 mmol/L (ref 3.5–5.1)
Sodium: 136 mmol/L (ref 135–145)

## 2018-02-27 LAB — GLUCOSE, CAPILLARY
GLUCOSE-CAPILLARY: 188 mg/dL — AB (ref 65–99)
Glucose-Capillary: 130 mg/dL — ABNORMAL HIGH (ref 65–99)
Glucose-Capillary: 249 mg/dL — ABNORMAL HIGH (ref 65–99)
Glucose-Capillary: 105 mg/dL — ABNORMAL HIGH (ref 65–99)

## 2018-02-27 MED ORDER — BOOST / RESOURCE BREEZE PO LIQD CUSTOM
1.0000 | Freq: Two times a day (BID) | ORAL | Status: DC
  Administered 2018-02-27 – 2018-02-28 (×3): 1 via ORAL

## 2018-02-27 MED ORDER — IPRATROPIUM-ALBUTEROL 0.5-2.5 (3) MG/3ML IN SOLN: 3.0000 mL | Freq: Four times a day (QID) | RESPIRATORY_TRACT | Status: DC | PRN

## 2018-02-27 MED ORDER — NIFEDIPINE ER OSMOTIC RELEASE 30 MG PO TB24
60.0000 mg | ORAL_TABLET | Freq: Every day | ORAL | Status: DC
  Administered 2018-02-27: 60 mg via ORAL
  Filled 2018-02-27 (×2): qty 2

## 2018-02-27 MED ORDER — PRO-STAT SUGAR FREE PO LIQD
30.0000 mL | Freq: Two times a day (BID) | ORAL | Status: DC
  Administered 2018-02-27 – 2018-02-28 (×3): 30 mL via ORAL
  Filled 2018-02-27 (×3): qty 30

## 2018-02-27 NOTE — Progress Notes (Addendum)
Subjective: Interval History: has complaints headache, states she has some nausea but no vomiting.  Complains of weakness and some abdominal pain..  Objective: Vital signs in last 24 hours: Temp:  [97.4 F (36.3 C)-98.2 F (36.8 C)] 97.4 F (36.3 C) (03/13 1545) Pulse Rate:  [72-98] 80 (03/14 0611) Resp:  [16-18] 18 (03/13 1545) BP: (90-151)/(33-76) 108/47 (03/14 0611) SpO2:  [95 %-98 %] 96 % (03/14 0749) Weight:  [108.4 kg (238 lb 15.7 oz)-109.6 kg (241 lb 10 oz)] 109.6 kg (241 lb 10 oz) (03/14 7026) Weight change: -2 kg (-6.5 oz)  Intake/Output from previous day: 03/13 0701 - 03/14 0700 In: 583 [P.O.:480; I.V.:3; IV Piggyback:100] Out: 647  Intake/Output this shift: No intake/output data recorded.  General appearance: alert, cooperative and no distress Resp: clear to auscultation bilaterally Cardio: regular rate and rhythm Extremities: No edema  Lab Results: Recent Labs    02/26/18 0523 02/27/18 0557  WBC 14.6* 12.8*  HGB 9.0* 9.4*  HCT 29.9* 31.4*  PLT 282 296   BMET:  Recent Labs    02/26/18 0523 02/27/18 0557  NA 138 136  K 4.2 4.2  CL 97* 94*  CO2 25 25  GLUCOSE 115* 151*  BUN 33* 28*  CREATININE 8.06* 7.81*  CALCIUM 9.3 8.8*   No results for input([name]): PTH in the last 72 hours. Iron Studies: No results for input([name]): IRON, TIBC, TRANSFERRIN, FERRITIN in the last 72 hours.  Studies/Results: Ct Abdomen Pelvis W Contrast  Result Date: 02/25/2018 CLINICAL DATA:  47 year old female with abdominal pain. EXAM: CT ABDOMEN AND PELVIS WITH CONTRAST TECHNIQUE: Multidetector CT imaging of the abdomen and pelvis was performed using the standard protocol following bolus administration of intravenous contrast. CONTRAST:  1103mL ISOVUE-300 IOPAMIDOL (ISOVUE-300) INJECTION 61% COMPARISON:  Abdominal CT dated 09/14/2016 FINDINGS: Lower chest: Bilateral lower lobe patchy and linear densities may represent atelectasis/scarring versus pneumonia. Clinical correlation is  recommended. There is mild cardiomegaly. No intra-abdominal free air or free fluid. Hepatobiliary: Cholecystectomy. The liver is unremarkable. No intrahepatic biliary ductal dilatation. Pancreas: Unremarkable. No pancreatic ductal dilatation or surrounding inflammatory changes. Spleen: Normal in size without focal abnormality. Adrenals/Urinary Tract: The adrenal glands are unremarkable. There is mild bilateral renal parenchyma atrophy. High attenuating content in the renal collecting system likely excreted contrast. There is no hydronephrosis on either side. The visualized ureters appear unremarkable. There is minimal distention of the urinary bladder with excreted intravenous contrast. There is apparent diffuse thickening of the bladder wall which may be partly related to underdistention. Cystitis is not excluded. Correlation with urinalysis recommended. Stomach/Bowel: There is thickened appearance of the: Primarily involving the transverse, descending, and rectosigmoid most consistent with colitis. Correlation with clinical exam and stool cultures recommended. There is no bowel obstruction. The appendix is not visualized with certainty. No inflammatory changes identified in the right lower quadrant. Vascular/Lymphatic: Mild aortoiliac atherosclerotic disease. No portal venous gas. There is no adenopathy. Reproductive: Hysterectomy. Stable appearance of the ovaries. No pelvic mass. Other: There is a small fat containing umbilical hernia. There is slight induration of the herniated fat which may represent mild inflammation. There is diastases recti in the anterior pelvic wall with protrusion of small amount of omental fat. No fluid collection or inflammatory changes. Musculoskeletal: Mild degenerative changes of the spine. No acute osseous pathology. IMPRESSION: 1. Colitis. Correlation with clinical exam and stool cultures recommended. No bowel obstruction. 2. Thickened appearance of the bladder wall likely related  to underdistention. Clinical correlation is recommended. 3. Atrophic kidneys. 4. Bibasilar  atelectasis/scarring. Pneumonia is less likely. Clinical correlation is recommended. 5. Cardiomegaly. Electronically Signed   By: Anner Crete M.D.   On: 02/25/2018 20:52    I have reviewed the patient'[name] current medications.  Assessment/Plan: 1] difficulty breathing: Possibly a combination of fluid overload/obstructive sleep apnea.  Patient was dialyzed for the last 3 days.  Were able to bring down her estimated dry weight 108.  Presently she is feeling better.  However patient still complains of cough. 2] history of colitis: Patient with complaints of nausea and some abdominal pain.  She is feeling better but still did not come to her normal level..  3] hypertension: Her blood pressure is low normal 4] bone and mineral disorder: Her calcium is a range but phosphorus is high..  Patient is on Senspar/Auryxia because of recurrent constipation from  [place]. 5] anemia: Her hemoglobin is slightly below our target goal 6] history of GERD Plan: We will hold anoxia until her diarrhea improves 2] we will continue with Renvela 800 mg 2 tablet p.o. 3 times daily with meals and one with snack 3] patient does require dialysis today 4] we will dialyze her tomorrow 5] we will check her CBC and renal panel in the morning. 6] decrease Procardia to 60 mg po once  A day   LOS: 3 days   Patricia [name] 02/27/2018,8:59 AM

## 2018-02-27 NOTE — Progress Notes (Signed)
PROGRESS NOTE    Patricia [name]  ZOX:[id number] DOB: 15-Jul-1971 DOA: 02/24/2018 PCP: Jake Samples, PA-C    Brief Narrative:   47 year old female with end-stage renal disease on hemodialysis, presented to the hospital with shortness of breath and found to have evidence of interstitial edema.  Admitted for further dialysis.  Also complains of vomiting, abdominal pain and diarrhea.  Further workup including CT scan has been requested.   Assessment & Plan:    1. Acute respiratory failure with hypoxia.  CT angiogram of chest negative for pulmonary embolus.  She had developed fluid overload in the setting of ESRD with interstitial edema, improving after 3 rounds of dialysis, to be dialyzed again tomorrow, shortness of breath is improving likely home after dialysis tomorrow nephrology on board. 2. End-stage renal disease on hemodialysis.  Nephrology following.  She has had dialysis for the last 3 days, dialysis holiday today and then repeat tomorrow. 3. Acute on chronic diastolic congestive heart failure EF 65% on recent echocardiogram.  Plan as in #1 above. 4. Acute colitis, suspect infectious.  Started on ciprofloxacin and Flagyl.  Diarrhea is improved, still mild abdominal pain, continue bowel rest with liquid diet, clinically better.  Likely discharge tomorrow after dialysis.   5. Elevated troponin.  No EKG changes.  No significant chest pain.  Borderline troponin due to #1 above. 6. DM type II.  Continue on sliding scale insulin.  Blood sugars are stable.  Continue to monitor. 7. Anemia of chronic disease.  Stable and per her baseline.  Continue to monitor. 8. Hypertension.  Currently stable.  Continue home medications 9. GERD.  Continue Protonix     DVT prophylaxis: Heparin Code Status: Full code Family Communication: Discussed with mother at the bedside.  Disposition Plan: Discharge home once shortness of breath and GI issues are improving.   Consultants:    Nephrology  Procedures:     Antimicrobials:   Ciprofloxacin 3/13 >  Metronidazole 3/13 >   Subjective: Still having abdominal pain.  No loose stools.  No nausea or vomiting.  Shortness of breath is better.  Objective: Vitals:   02/26/18 2037 02/26/18 2117 02/27/18 0611 02/27/18 0749  BP:  (!) 151/76 (!) 108/47   Pulse:  79 80   Resp:      Temp:      TempSrc:      SpO2: 96% 98% 98% 96%  Weight:   109.6 kg (241 lb 10 oz)   Height:        Intake/Output Summary (Last 24 hours) at 02/27/2018 1018 Last data filed at 02/26/2018 1900 Gross per 24 hour  Intake 580 ml  Output 647 ml  Net -67 ml   Filed Weights   02/26/18 1245 02/26/18 1545 02/27/18 0611  Weight: 109.2 kg (240 lb 11.9 oz) 108.4 kg (238 lb 15.7 oz) 109.6 kg (241 lb 10 oz)    Examination:  Awake Alert, Oriented X 3, No new F.N deficits, Normal affect [place].AT,PERRAL Supple Neck,No JVD, No cervical lymphadenopathy appriciated.  Symmetrical Chest wall movement, Good air movement bilaterally, CTAB RRR,No Gallops, Rubs or new Murmurs, No Parasternal Heave +ve B.Sounds, Abd Soft, mild lower quadrant tenderness, No organomegaly appriciated, No rebound - guarding or rigidity. No Cyanosis, Clubbing or edema, No new Rash or bruise   Data Reviewed: I have personally reviewed following labs and imaging studies  CBC: Recent Labs  Lab 02/24/18 1412 02/25/18 0505 02/26/18 0523 02/27/18 0557  WBC 10.1 9.0 14.6* 12.8*  NEUTROABS 7.8*  --   --   --  HGB 9.1* 9.2* 9.0* 9.4*  HCT 29.3* 30.5* 29.9* 31.4*  MCV 88.5 90.5 91.4 91.8  PLT 252 270 282 710   Basic Metabolic Panel: Recent Labs  Lab 02/24/18 1412 02/25/18 0505 02/26/18 0523 02/27/18 0557  NA 138 137 138 136  K 3.2* 4.6 4.2 4.2  CL 97* 96* 97* 94*  CO2 24 24 25 25   GLUCOSE 192* 374* 115* 151*  BUN 20 33* 33* 28*  CREATININE 5.65* 7.80* 8.06* 7.81*  CALCIUM 9.8 9.5 9.3 8.8*  PHOS  --  5.2* 6.3*  --    GFR: Estimated Creatinine Clearance:  10.5 mL/min (A) (by C-G formula based on SCr of 7.81 mg/dL (H)). Liver Function Tests: Recent Labs  Lab 02/24/18 1412 02/26/18 0523  AST 19  --   ALT 18  --   ALKPHOS 116  --   BILITOT 0.9  --   PROT 8.1  --   ALBUMIN 4.2 4.0   No results for input(s): LIPASE, AMYLASE in the last 168 hours. No results for input(s): AMMONIA in the last 168 hours. Coagulation Profile: No results for input(s): INR, PROTIME in the last 168 hours. Cardiac Enzymes: Recent Labs  Lab 02/24/18 1412  TROPONINI 0.03*   BNP (last 3 results) No results for input(s): PROBNP in the last 8760 hours. HbA1C: No results for input(s): HGBA1C in the last 72 hours. CBG: Recent Labs  Lab 02/26/18 0726 02/26/18 1122 02/26/18 1648 02/26/18 2029 02/27/18 0739  GLUCAP 101* 123* 107* 97 130*   Lipid Profile: No results for input(s): CHOL, HDL, LDLCALC, TRIG, CHOLHDL, LDLDIRECT in the last 72 hours. Thyroid Function Tests: No results for input(s): TSH, T4TOTAL, FREET4, T3FREE, THYROIDAB in the last 72 hours. Anemia Panel: No results for input(s): VITAMINB12, FOLATE, FERRITIN, TIBC, IRON, RETICCTPCT in the last 72 hours. Sepsis Labs: No results for input(s): PROCALCITON, LATICACIDVEN in the last 168 hours.  Recent Results (from the past 240 hour(s))  MRSA PCR Screening     Status: None   Collection Time: 02/24/18 11:24 PM  Result Value Ref Range Status   MRSA by PCR NEGATIVE NEGATIVE Final    Comment:        The GeneXpert MRSA Assay (FDA approved for NASAL specimens only), is one component of a comprehensive MRSA colonization surveillance program. It is not intended to diagnose MRSA infection nor to guide or monitor treatment for MRSA infections. Performed at [facility], [address], [place],  [address]          Radiology Studies: Ct Abdomen Pelvis W Contrast  Result Date: 02/25/2018 CLINICAL DATA:  47 year old female with abdominal pain. EXAM: CT ABDOMEN AND PELVIS WITH CONTRAST  TECHNIQUE: Multidetector CT imaging of the abdomen and pelvis was performed using the standard protocol following bolus administration of intravenous contrast. CONTRAST:  173mL ISOVUE-300 IOPAMIDOL (ISOVUE-300) INJECTION 61% COMPARISON:  Abdominal CT dated 09/14/2016 FINDINGS: Lower chest: Bilateral lower lobe patchy and linear densities may represent atelectasis/scarring versus pneumonia. Clinical correlation is recommended. There is mild cardiomegaly. No intra-abdominal free air or free fluid. Hepatobiliary: Cholecystectomy. The liver is unremarkable. No intrahepatic biliary ductal dilatation. Pancreas: Unremarkable. No pancreatic ductal dilatation or surrounding inflammatory changes. Spleen: Normal in size without focal abnormality. Adrenals/Urinary Tract: The adrenal glands are unremarkable. There is mild bilateral renal parenchyma atrophy. High attenuating content in the renal collecting system likely excreted contrast. There is no hydronephrosis on either side. The visualized ureters appear unremarkable. There is minimal distention of the urinary bladder with excreted intravenous  contrast. There is apparent diffuse thickening of the bladder wall which may be partly related to underdistention. Cystitis is not excluded. Correlation with urinalysis recommended. Stomach/Bowel: There is thickened appearance of the: Primarily involving the transverse, descending, and rectosigmoid most consistent with colitis. Correlation with clinical exam and stool cultures recommended. There is no bowel obstruction. The appendix is not visualized with certainty. No inflammatory changes identified in the right lower quadrant. Vascular/Lymphatic: Mild aortoiliac atherosclerotic disease. No portal venous gas. There is no adenopathy. Reproductive: Hysterectomy. Stable appearance of the ovaries. No pelvic mass. Other: There is a small fat containing umbilical hernia. There is slight induration of the herniated fat which may represent  mild inflammation. There is diastases recti in the anterior pelvic wall with protrusion of small amount of omental fat. No fluid collection or inflammatory changes. Musculoskeletal: Mild degenerative changes of the spine. No acute osseous pathology. IMPRESSION: 1. Colitis. Correlation with clinical exam and stool cultures recommended. No bowel obstruction. 2. Thickened appearance of the bladder wall likely related to underdistention. Clinical correlation is recommended. 3. Atrophic kidneys. 4. Bibasilar atelectasis/scarring. Pneumonia is less likely. Clinical correlation is recommended. 5. Cardiomegaly. Electronically Signed   By: Anner Crete M.D.   On: 02/25/2018 20:52        Scheduled Meds: . cinacalcet  30 mg Oral QPM  . epoetin (EPOGEN/PROCRIT) injection  4,000 Units Subcutaneous Q M,W,F-HD  . feeding supplement (ENSURE ENLIVE)  237 mL Oral BID BM  . fluticasone  2 spray Each Nare Daily  . furosemide  40 mg Oral QHS  . furosemide  80 mg Oral q morning - 10a  . gabapentin  300 mg Oral TID  . guaiFENesin  1,200 mg Oral BID  . heparin  5,000 Units Subcutaneous Q8H  . insulin aspart  0-15 Units Subcutaneous TID WC  . insulin aspart  0-5 Units Subcutaneous QHS  . labetalol  200 mg Oral BID  . mouth rinse  15 mL Mouth Rinse BID  . multivitamin  1 tablet Oral Daily  . NIFEdipine  60 mg Oral Daily  . pantoprazole  40 mg Oral Daily  . rOPINIRole  2 mg Oral QHS  . sevelamer carbonate  800 mg Oral BID WC  . sodium chloride flush  3 mL Intravenous Q12H   Continuous Infusions: . sodium chloride    . sodium chloride    . sodium chloride    . sodium chloride    . sodium chloride    . ciprofloxacin Stopped (02/26/18 2258)  . metronidazole Stopped (02/27/18 0235)     LOS: 3 days    Time spent: 38mins  Signature  Lala Lund M.D on 02/27/2018 at 10:18 AM  Between 7am to 7pm - Pager - [phone] ( page via [email or web address], text pages only, please mention full 10 digit call back  number).  After 7pm go to [email or web address] - password [facility]

## 2018-02-27 NOTE — Progress Notes (Signed)
Pt has been refusing Renvela. Dr Lowanda Foster made aware.

## 2018-02-28 DIAGNOSIS — K529 Noninfective gastroenteritis and colitis, unspecified: Secondary | ICD-10-CM

## 2018-02-28 DIAGNOSIS — E876 Hypokalemia: Secondary | ICD-10-CM

## 2018-02-28 LAB — RENAL FUNCTION PANEL
Albumin: 4 g/dL (ref 3.5–5.0)
Anion gap: 18 — ABNORMAL HIGH (ref 5–15)
BUN: 38 mg/dL — AB (ref 6–20)
CHLORIDE: 96 mmol/L — AB (ref 101–111)
CO2: 22 mmol/L (ref 22–32)
Calcium: 8.2 mg/dL — ABNORMAL LOW (ref 8.9–10.3)
Creatinine, Ser: 10.69 mg/dL — ABNORMAL HIGH (ref 0.44–1.00)
GFR calc Af Amer: 4 mL/min — ABNORMAL LOW (ref >60)
GFR calc non Af Amer: 4 mL/min — ABNORMAL LOW (ref >60)
GLUCOSE: 127 mg/dL — AB (ref 65–99)
POTASSIUM: 4.1 mmol/L (ref 3.5–5.1)
Phosphorus: 7 mg/dL — ABNORMAL HIGH (ref 2.5–4.6)
Sodium: 136 mmol/L (ref 135–145)

## 2018-02-28 LAB — CBC
HCT: 30.2 % — ABNORMAL LOW (ref 36.0–46.0)
Hemoglobin: 9.1 g/dL — ABNORMAL LOW (ref 12.0–15.0)
MCH: 27.4 pg (ref 26.0–34.0)
MCHC: 30.1 g/dL (ref 30.0–36.0)
MCV: 91 fL (ref 78.0–100.0)
PLATELETS: 293 10*3/uL (ref 150–400)
RBC: 3.32 MIL/uL — ABNORMAL LOW (ref 3.87–5.11)
RDW: 16 % — ABNORMAL HIGH (ref 11.5–15.5)
WBC: 10.6 10*3/uL — ABNORMAL HIGH (ref 4.0–10.5)

## 2018-02-28 LAB — GLUCOSE, CAPILLARY
GLUCOSE-CAPILLARY: 130 mg/dL — AB (ref 65–99)
Glucose-Capillary: 96 mg/dL (ref 65–99)

## 2018-02-28 MED ORDER — NIFEDIPINE ER 60 MG PO TB24: 60.0000 mg | ORAL_TABLET | Freq: Every day | ORAL | 0 refills | Status: DC

## 2018-02-28 MED ORDER — SODIUM CHLORIDE 0.9 % IV SOLN: 100.0000 mL | INTRAVENOUS | Status: DC | PRN

## 2018-02-28 MED ORDER — PROMETHAZINE HCL 12.5 MG PO TABS: 12.5000 mg | ORAL_TABLET | Freq: Four times a day (QID) | ORAL | 0 refills | Status: DC | PRN

## 2018-02-28 MED ORDER — METRONIDAZOLE 500 MG PO TABS: 500.0000 mg | ORAL_TABLET | Freq: Three times a day (TID) | ORAL | 0 refills | Status: AC

## 2018-02-28 MED ORDER — CIPROFLOXACIN HCL 500 MG PO TABS: 500.0000 mg | ORAL_TABLET | Freq: Every day | ORAL | 0 refills | Status: AC

## 2018-02-28 NOTE — Discharge Summary (Signed)
Physician Discharge Summary  Patricia [name] UUV:[id number] DOB: 1971-10-19 DOA: 02/24/2018  PCP: Jake Samples, PA-C  Admit date: 02/24/2018 Discharge date: 02/28/2018  Admitted From: Home Disposition: Home  Recommendations for Outpatient Follow-up:  1. Follow up with PCP in 1-2 weeks 2. Please obtain BMP/CBC in one week 3. Follow-up with nephrology for scheduled dialysis 4. Follow-up with gastroenterology for continued issues with nausea and vomiting.  Home Health: Equipment/Devices:  Discharge Condition: Stable CODE STATUS: Full code Diet recommendation: Heart Healthy / Carb Modified  Brief/Interim Summary: 47 year old female with end-stage renal disease on hemodialysis, presented to the hospital with shortness of breath and found to have evidence of interstitial edema.  Admitted for further dialysis.  Also complains of vomiting, abdominal pain and diarrhea.  CT abdomen indicated evidence of colitis   Discharge Diagnoses:  Principal Problem:   Acute respiratory failure with hypoxia (HCC) Active Problems:   End stage renal disease on dialysis ([place])   Diabetes mellitus ([place])   Essential hypertension   GERD (gastroesophageal reflux disease)   Breast cancer (HCC)   Hypokalemia   Anemia   Elevated troponin   Hypoxemia   Acute colitis  1. Acute respiratory failure with hypoxia.  CT angiogram of chest negative for pulmonary embolus.  It did show evidence of interstitial edema.    Patient was seen by nephrology and underwent fluid removal through dialysis.  Overall respiratory status has improved.. 2. End-stage renal disease on hemodialysis.  Nephrology following.  She underwent dialysis on 3/12 and 3/13 and 3/15. 3. Acute on chronic diastolic congestive heart failure.  Related to renal disease.  She had evidence of interstitial edema on chest imaging.    She underwent fluid removal through dialysis.  Overall volume status has improved.. 4. Acute colitis, suspect infectious.   Started on ciprofloxacin and Flagyl.  Clinically, the patient began to improve.  Abdominal pain has significantly improved and she is able to tolerate a solid diet.  She does still have some loose stool, but appears to have a history of IBS.  Loose stools appear to be manageable at this time, only occurring once or twice through the day.  She has chronic nausea and vomiting that she follows with GI.  She is been advised to follow-up with her primary gastroenterologist.  She will be discharged home on a course of oral antibiotics.  She is been asked to hold further Amitiza until her loose stools have completely resolved 5. Elevated troponin.  No EKG changes.  No significant chest pain. 6. Diabetes.  Continue on sliding scale insulin.  Blood sugars are stable.  Continue to monitor. 7. Anemia of chronic disease.  Stable and per her baseline.  Continue to monitor. 8. Hypertension.  Currently stable.  Continue home medications 9. GERD.  Continue Protonix     Discharge Instructions  Discharge Instructions    Diet - low sodium heart healthy   Complete by:  As directed    Increase activity slowly   Complete by:  As directed      Allergies as of 02/28/2018      Reactions   Reglan [metoclopramide] Other (See Comments)   hallucinations hallucinations   Amlodipine Besylate Rash, Other (See Comments)   dizziness      Medication List    STOP taking these medications   AMITIZA 24 MCG capsule Generic drug:  lubiprostone   predniSONE 10 MG tablet Commonly known as:  DELTASONE     TAKE these medications   acetaminophen 500 MG tablet  Commonly known as:  TYLENOL Take 500 mg by mouth 3 (three) times daily as needed for moderate pain.   albuterol (2.5 MG/3ML) 0.083% nebulizer solution Commonly known as:  PROVENTIL Take 3 mLs (2.5 mg total) by nebulization every 6 (six) hours as needed for wheezing or shortness of breath.   albuterol 108 (90 Base) MCG/ACT inhaler Commonly known as:  PROVENTIL  HFA;VENTOLIN HFA Inhale 2 puffs into the lungs every 6 (six) hours as needed for wheezing or shortness of breath.   AURYXIA 1 GM 210 MG(Fe) tablet Generic drug:  ferric citrate Take 2 tablets by mouth 2 (two) times daily with a meal.   cinacalcet 30 MG tablet Commonly known as:  SENSIPAR Take 30 mg by mouth daily. What changed:  Another medication with the same name was removed. Continue taking this medication, and follow the directions you see here.   ciprofloxacin 500 MG tablet Commonly known as:  CIPRO Take 1 tablet (500 mg total) by mouth daily with breakfast for 10 days.   fluticasone 50 MCG/ACT nasal spray Commonly known as:  FLONASE Place 2 sprays into both nostrils daily. LEFT NARE ONLY   furosemide 40 MG tablet Commonly known as:  LASIX Take 40-80 mg by mouth 2 (two) times daily. 80mg  in the morning and 40mg  at bedtime   gabapentin 300 MG capsule Commonly known as:  NEURONTIN Take 300 mg by mouth 3 (three) times daily.   glipiZIDE 5 MG tablet Commonly known as:  GLUCOTROL Take 5 mg by mouth 2 (two) times daily as needed. For blood sugar levels over 150   guaiFENesin 600 MG 12 hr tablet Commonly known as:  MUCINEX Take 1,200 mg by mouth 2 (two) times daily.   labetalol 200 MG tablet Commonly known as:  NORMODYNE Take 200 mg by mouth 2 (two) times daily.   lactulose 10 GM/15ML solution Commonly known as:  CHRONULAC Take 30 mLs by mouth daily as needed for mild constipation.   metroNIDAZOLE 500 MG tablet Commonly known as:  FLAGYL Take 1 tablet (500 mg total) by mouth 3 (three) times daily for 10 days.   multivitamin Tabs tablet Take 1 tablet by mouth daily.   NIFEdipine 60 MG 24 hr tablet Commonly known as:  PROCARDIA-XL/ADALAT CC Take 1 tablet (60 mg total) by mouth daily. What changed:  how much to take   ondansetron 4 MG disintegrating tablet Commonly known as:  ZOFRAN-ODT Take 4 mg by mouth every 8 (eight) hours as needed for nausea or vomiting.    pantoprazole 20 MG tablet Commonly known as:  PROTONIX Take 20 mg by mouth daily.   promethazine 12.5 MG tablet Commonly known as:  PHENERGAN Take 1 tablet (12.5 mg total) by mouth every 6 (six) hours as needed for nausea or vomiting.   RENVELA 800 MG tablet Generic drug:  sevelamer carbonate Take 800 mg by mouth See admin instructions. Take 1 tablet in the morning and 1 tablet in the evening. Take 2 tablets if you eat snack.   rOPINIRole 2 MG 24 hr tablet Commonly known as:  REQUIP XL Take 2 mg by mouth at bedtime.       Allergies  Allergen Reactions  . Reglan [Metoclopramide] Other (See Comments)    hallucinations hallucinations  . Amlodipine Besylate Rash and Other (See Comments)    dizziness    Consultations:  Nephrology   Procedures/Studies: Dg Chest 2 View  Result Date: 02/24/2018 CLINICAL DATA:  Shortness of breath on exertion. EXAM: CHEST -  2 VIEW COMPARISON:  Radiographs of November 26, 2017. FINDINGS: Stable cardiomediastinal silhouette and central pulmonary vascular congestion is noted. No pneumothorax or significant pleural effusion is noted. Bony thorax is unremarkable. Left axillary surgical clips are noted. Possible mild bibasilar pulmonary edema may be present. Bony thorax is unremarkable. IMPRESSION: Stable central pulmonary vascular congestion with possible mild bibasilar pulmonary edema. Electronically Signed   By: Marijo Conception, M.D.   On: 02/24/2018 12:48   Ct Angio Chest Pe W Or Wo Contrast  Result Date: 02/24/2018 CLINICAL DATA:  Shortness of breath. EXAM: CT ANGIOGRAPHY CHEST WITH CONTRAST TECHNIQUE: Multidetector CT imaging of the chest was performed using the standard protocol during bolus administration of intravenous contrast. Multiplanar CT image reconstructions and MIPs were obtained to evaluate the vascular anatomy. CONTRAST:  115mL ISOVUE-370 IOPAMIDOL (ISOVUE-370) INJECTION 76% COMPARISON:  Chest x-ray dated 02/24/2018 and CT scans dated  11/26/2017 and 09/25/2017 and 07/21/2017 FINDINGS: Cardiovascular: Satisfactory opacification of the pulmonary arteries to the segmental level. No evidence of pulmonary embolism. Normal heart size. No pericardial effusion. Mediastinum/Nodes: Chronic mediastinal adenopathy, unchanged since 07/21/2017 Lungs/Pleura: Small bilateral pleural effusions with slight interstitial edema at the right lung base and slight bibasilar atelectasis. Bilateral peribronchial thickening consistent with edema. Upper Abdomen: No acute abnormality. Musculoskeletal: Left mastectomy. No acute or significant osseous findings. Review of the MIP images confirms the above findings. IMPRESSION: 1. No pulmonary emboli. 2. Mild interstitial edema at the right lung base. 3. Minimal bilateral pleural effusions with bibasilar atelectasis. Electronically Signed   By: Lorriane Shire M.D.   On: 02/24/2018 17:10   Ct Abdomen Pelvis W Contrast  Result Date: 02/25/2018 CLINICAL DATA:  47 year old female with abdominal pain. EXAM: CT ABDOMEN AND PELVIS WITH CONTRAST TECHNIQUE: Multidetector CT imaging of the abdomen and pelvis was performed using the standard protocol following bolus administration of intravenous contrast. CONTRAST:  172mL ISOVUE-300 IOPAMIDOL (ISOVUE-300) INJECTION 61% COMPARISON:  Abdominal CT dated 09/14/2016 FINDINGS: Lower chest: Bilateral lower lobe patchy and linear densities may represent atelectasis/scarring versus pneumonia. Clinical correlation is recommended. There is mild cardiomegaly. No intra-abdominal free air or free fluid. Hepatobiliary: Cholecystectomy. The liver is unremarkable. No intrahepatic biliary ductal dilatation. Pancreas: Unremarkable. No pancreatic ductal dilatation or surrounding inflammatory changes. Spleen: Normal in size without focal abnormality. Adrenals/Urinary Tract: The adrenal glands are unremarkable. There is mild bilateral renal parenchyma atrophy. High attenuating content in the renal  collecting system likely excreted contrast. There is no hydronephrosis on either side. The visualized ureters appear unremarkable. There is minimal distention of the urinary bladder with excreted intravenous contrast. There is apparent diffuse thickening of the bladder wall which may be partly related to underdistention. Cystitis is not excluded. Correlation with urinalysis recommended. Stomach/Bowel: There is thickened appearance of the: Primarily involving the transverse, descending, and rectosigmoid most consistent with colitis. Correlation with clinical exam and stool cultures recommended. There is no bowel obstruction. The appendix is not visualized with certainty. No inflammatory changes identified in the right lower quadrant. Vascular/Lymphatic: Mild aortoiliac atherosclerotic disease. No portal venous gas. There is no adenopathy. Reproductive: Hysterectomy. Stable appearance of the ovaries. No pelvic mass. Other: There is a small fat containing umbilical hernia. There is slight induration of the herniated fat which may represent mild inflammation. There is diastases recti in the anterior pelvic wall with protrusion of small amount of omental fat. No fluid collection or inflammatory changes. Musculoskeletal: Mild degenerative changes of the spine. No acute osseous pathology. IMPRESSION: 1. Colitis. Correlation with clinical exam and stool  cultures recommended. No bowel obstruction. 2. Thickened appearance of the bladder wall likely related to underdistention. Clinical correlation is recommended. 3. Atrophic kidneys. 4. Bibasilar atelectasis/scarring. Pneumonia is less likely. Clinical correlation is recommended. 5. Cardiomegaly. Electronically Signed   By: Anner Crete M.D.   On: 02/25/2018 20:52   Dg Hand Complete Left  Result Date: 02/04/2018 CLINICAL DATA:  Pain swelling and tingling of the third and fourth fingers for several weeks EXAM: LEFT HAND - COMPLETE 3+ VIEW COMPARISON:  None. FINDINGS:  The left radiocarpal joint space appears normal. The carpal bones are in normal position. MCP, PIP, and DIP joints are unremarkable. No fracture is seen. No erosion is noted. IMPRESSION: Negative. Electronically Signed   By: Ivar Drape M.D.   On: 02/04/2018 14:43        Subjective: Reports that she did have some vomiting overnight.  Had one loose stool last night and one earlier this morning.  She is very eager to advance her diet  Discharge Exam: Vitals:   02/28/18 1150 02/28/18 1300  BP: (!) 111/45 (!) 105/49  Pulse: 69 76  Resp: 17 18  Temp: 98.1 F (36.7 C) 97.8 F (36.6 C)  SpO2: 98% 97%   Vitals:   02/28/18 1100 02/28/18 1130 02/28/18 1150 02/28/18 1300  BP: (!) 104/46 (!) 116/53 (!) 111/45 (!) 105/49  Pulse: 82 83 69 76  Resp:   17 18  Temp:   98.1 F (36.7 C) 97.8 F (36.6 C)  TempSrc:   Oral   SpO2:   98% 97%  Weight:   106.8 kg (235 lb 7.2 oz)   Height:        General: Pt is alert, awake, not in acute distress Cardiovascular: RRR, S1/S2 +, no rubs, no gallops Respiratory: CTA bilaterally, no wheezing, no rhonchi Abdominal: Soft, nondistended, mild tenderness throughout abdomen.  Positive bowel sounds Extremities: no edema, no cyanosis    The results of significant diagnostics from this hospitalization (including imaging, microbiology, ancillary and laboratory) are listed below for reference.     Microbiology: Recent Results (from the past 240 hour(s))  MRSA PCR Screening     Status: None   Collection Time: 02/24/18 11:24 PM  Result Value Ref Range Status   MRSA by PCR NEGATIVE NEGATIVE Final    Comment:        The GeneXpert MRSA Assay (FDA approved for NASAL specimens only), is one component of a comprehensive MRSA colonization surveillance program. It is not intended to diagnose MRSA infection nor to guide or monitor treatment for MRSA infections. Performed at [facility], [address] [place], [place] [address]      Labs: BNP (last 3  results) Recent Labs    11/26/17 1217 02/24/18 1412  BNP 270.0* 094.7*   Basic Metabolic Panel: Recent Labs  Lab 02/24/18 1412 02/25/18 0505 02/26/18 0523 02/27/18 0557 02/28/18 0548  NA 138 137 138 136 136  K 3.2* 4.6 4.2 4.2 4.1  CL 97* 96* 97* 94* 96*  CO2 24 24 25 25 22   GLUCOSE 192* 374* 115* 151* 127*  BUN 20 33* 33* 28* 38*  CREATININE 5.65* 7.80* 8.06* 7.81* 10.69*  CALCIUM 9.8 9.5 9.3 8.8* 8.2*  PHOS  --  5.2* 6.3*  --  7.0*   Liver Function Tests: Recent Labs  Lab 02/24/18 1412 02/26/18 0523 02/28/18 0548  AST 19  --   --   ALT 18  --   --   ALKPHOS 116  --   --  BILITOT 0.9  --   --   PROT 8.1  --   --   ALBUMIN 4.2 4.0 4.0   No results for input(s): LIPASE, AMYLASE in the last 168 hours. No results for input(s): AMMONIA in the last 168 hours. CBC: Recent Labs  Lab 02/24/18 1412 02/25/18 0505 02/26/18 0523 02/27/18 0557 02/28/18 0548  WBC 10.1 9.0 14.6* 12.8* 10.6*  NEUTROABS 7.8*  --   --   --   --   HGB 9.1* 9.2* 9.0* 9.4* 9.1*  HCT 29.3* 30.5* 29.9* 31.4* 30.2*  MCV 88.5 90.5 91.4 91.8 91.0  PLT 252 270 282 296 293   Cardiac Enzymes: Recent Labs  Lab 02/24/18 1412  TROPONINI 0.03*   BNP: Invalid input(s): POCBNP CBG: Recent Labs  Lab 02/27/18 1054 02/27/18 1605 02/27/18 2037 02/28/18 0737 02/28/18 1120  GLUCAP 188* 249* 105* 130* 96   D-Dimer No results for input(s): DDIMER in the last 72 hours. Hgb A1c No results for input(s): HGBA1C in the last 72 hours. Lipid Profile No results for input(s): CHOL, HDL, LDLCALC, TRIG, CHOLHDL, LDLDIRECT in the last 72 hours. Thyroid function studies No results for input(s): TSH, T4TOTAL, T3FREE, THYROIDAB in the last 72 hours.  Invalid input(s): FREET3 Anemia work up No results for input(s): VITAMINB12, FOLATE, FERRITIN, TIBC, IRON, RETICCTPCT in the last 72 hours. Urinalysis    Component Value Date/Time   COLORURINE YELLOW 09/14/2016 1915   APPEARANCEUR CLEAR 09/14/2016 1915    LABSPEC 1.015 09/14/2016 1915   PHURINE 7.5 09/14/2016 1915   GLUCOSEU >1000 (A) 09/14/2016 1915   HGBUR TRACE (A) 09/14/2016 1915   BILIRUBINUR NEGATIVE 09/14/2016 [place] NEGATIVE 09/14/2016 1915   PROTEINUR >300 (A) 09/14/2016 1915   NITRITE NEGATIVE 09/14/2016 1915   LEUKOCYTESUR NEGATIVE 09/14/2016 1915   Sepsis Labs Invalid input(s): PROCALCITONIN,  WBC,  LACTICIDVEN Microbiology Recent Results (from the past 240 hour(s))  MRSA PCR Screening     Status: None   Collection Time: 02/24/18 11:24 PM  Result Value Ref Range Status   MRSA by PCR NEGATIVE NEGATIVE Final    Comment:        The GeneXpert MRSA Assay (FDA approved for NASAL specimens only), is one component of a comprehensive MRSA colonization surveillance program. It is not intended to diagnose MRSA infection nor to guide or monitor treatment for MRSA infections. Performed at [facility], [address], [place], [place] [address]      Time coordinating discharge: Over 30 minutes  SIGNED:   Kathie Dike, MD  [facility] 02/28/2018, 8:38 PM Pager   If 7PM-7AM, please contact night-coverage [email or web address] Password [email or web address]

## 2018-02-28 NOTE — Progress Notes (Signed)
IVs removed, patient tolerated well, reviewed AVS with patient and patient verbalized understanding.  Patient transported home by her father.

## 2018-02-28 NOTE — Progress Notes (Signed)
Subjective: Interval History: Patient is seen on hemodialysis.   Still she complains of some nausea.  She states that she is still she has some diarrhea.  Her cough and breathing is better.  Objective: Vital signs in last 24 hours: Temp:  [97.9 F (36.6 C)-98.9 F (37.2 C)] 98.9 F (37.2 C) (03/15 0825) Pulse Rate:  [68-83] 83 (03/15 1030) Resp:  [17-20] 17 (03/15 0900) BP: (94-135)/(41-68) 101/43 (03/15 1030) SpO2:  [93 %-97 %] 96 % (03/15 0930) Weight:  [108.9 kg (240 lb 1.3 oz)] 108.9 kg (240 lb 1.3 oz) (03/15 0815) Weight change:   Intake/Output from previous day: 03/14 0701 - 03/15 0700 In: 960 [P.O.:960] Out: -  Intake/Output this shift: Total I/O In: 120 [P.O.:120] Out: -   General appearance: alert, cooperative and no distress Resp: clear to auscultation bilaterally Cardio: regular rate and rhythm Extremities: No edema  Lab Results: Recent Labs    02/27/18 0557 02/28/18 0548  WBC 12.8* 10.6*  HGB 9.4* 9.1*  HCT 31.4* 30.2*  PLT 296 293   BMET:  Recent Labs    02/27/18 0557 02/28/18 0548  NA 136 136  K 4.2 4.1  CL 94* 96*  CO2 25 22  GLUCOSE 151* 127*  BUN 28* 38*  CREATININE 7.81* 10.69*  CALCIUM 8.8* 8.2*   No results for input(s): PTH in the last 72 hours. Iron Studies: No results for input(s): IRON, TIBC, TRANSFERRIN, FERRITIN in the last 72 hours.  Studies/Results: No results found.  I have reviewed the patient's current medications.  Assessment/Plan: 1] difficulty breathing: Possibly a combination of fluid overload/obstructive sleep apnea.  Patient is presently on dialysis.  She is feeling better. 2] history of colitis: Patient with complaints of nausea and some abdominal pain.  She has still some diarrhea.  3] hypertension: Her blood pressure is somewhat low.  Procardia was decreased. 4] bone and mineral disorder: Her calcium is a range but phosphorus is high..  Patient is on Renvela.  Patient did not take her medication until last  night. 5] anemia: Her hemoglobin is slightly below our target goal.  She is on Neupogen 6] history of GERD Plan: We will try to remove 3 L if her systolic blood pressure remains above 90 2] we will check her CBC and renal panel in the morning.   LOS: 4 days   Anda Sobotta S 02/28/2018,10:41 AM

## 2018-03-13 ENCOUNTER — Encounter (INDEPENDENT_AMBULATORY_CARE_PROVIDER_SITE_OTHER): Payer: Self-pay | Admitting: Orthopaedic Surgery

## 2018-03-13 ENCOUNTER — Ambulatory Visit (INDEPENDENT_AMBULATORY_CARE_PROVIDER_SITE_OTHER): Payer: PRIVATE HEALTH INSURANCE | Admitting: Orthopaedic Surgery

## 2018-03-13 VITALS — BP 153/85 | HR 98 | Ht 63.0 in | Wt 245.0 lb

## 2018-03-13 DIAGNOSIS — M79642 Pain in left hand: Secondary | ICD-10-CM

## 2018-03-13 DIAGNOSIS — N184 Chronic kidney disease, stage 4 (severe): Secondary | ICD-10-CM | POA: Diagnosis not present

## 2018-03-13 DIAGNOSIS — Z0001 Encounter for general adult medical examination with abnormal findings: Secondary | ICD-10-CM | POA: Diagnosis not present

## 2018-03-13 DIAGNOSIS — Z6841 Body Mass Index (BMI) 40.0 and over, adult: Secondary | ICD-10-CM | POA: Diagnosis not present

## 2018-03-13 DIAGNOSIS — Z1389 Encounter for screening for other disorder: Secondary | ICD-10-CM | POA: Diagnosis not present

## 2018-03-13 DIAGNOSIS — E1129 Type 2 diabetes mellitus with other diabetic kidney complication: Secondary | ICD-10-CM | POA: Diagnosis not present

## 2018-03-13 NOTE — Progress Notes (Signed)
Office Visit Note   Patient: Patricia [name]           Date of Birth: Sep 25, 1971           MRN: [id number] Visit Date: 03/13/2018              Requested by: Jake Samples, PA-C [address] [place], [place] [address] PCP: Jake Samples, PA-C   Assessment & Plan: Visit Diagnoses:  1. Pain in left hand     Plan: Patient has some stiffness and discomfort with flexion of her left fingers without evidence of synovitis.  Her flexion is better with distraction.  Set her up for some hand therapy to work on finger flexion extension edema reduction although on exam today she really does not have any finger edema.  I plan to recheck her again in 1 month.  Follow-Up Instructions: No follow-ups on file.   Orders:  Orders Placed This Encounter  Procedures  . Ambulatory referral to Occupational Therapy   No orders of the defined types were placed in this encounter.     Procedures: No procedures performed   Clinical Data: No additional findings.   Subjective: Chief Complaint  Patient presents with  . Left Hand - Pain    HPI 47 year old female seen with problems with left hand pain and finger stiffness and inability to make a fist.  She states is been like this for many weeks.  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  She has complex history with past pneumonia, diabetes, chronic dialysis, history of acute respiratory failure with hypoxia.  Injury to her hand no associated neck pain.  Dialysis shunt is in the left upper arm.  Patient was prescribed Neurontin but after reading the label she was afraid to take it.  She was concerned about suicide attempts with the medication. Patient states that her hand is swollen at least to her observation.  Review of Systems positive for pneumonia respiratory failure, dyspnea, hypoxia, acute colitis, pyloric stenosis, hypertension, diabetes, chronic  dialysis.   Objective: Vital Signs: BP (!) 153/85   Pulse 98   Ht 5\' 3"  (1.6 m)   Wt 245 lb (111.1 kg)   BMI 43.40 kg/m   Physical Exam  Constitutional: She is oriented to person, place, and time. She appears well-developed.  HENT:  Head: Normocephalic.  Right Ear: External ear normal.  Left Ear: External ear normal.  Eyes: Pupils are equal, round, and reactive to light.  Neck: No tracheal deviation present. No thyromegaly present.  Cardiovascular: Normal rate.  Pulmonary/Chest: Effort normal.  Abdominal: Soft.  Neurological: She is alert and oriented to person, place, and time.  Skin: Skin is warm and dry.  Psychiatric: She has a normal mood and affect. Her behavior is normal.  Obesity, glasses, mild hirsutism.  Ortho Exam full cervical range of motion negative Spurling no brachial plexus tenderness normal chin to chest normal extension.  No shoulder impingement.  Dialysis shunt left upper arm.  Elbow reaches full extension.  She complains of pain with attempts at fingertip flexion to distal palmar crease but is able to flex better when she is distracted and does not counter pole with the extensor digitorum communis contraction.  No interosseous atrophy thenar or hyperthenar normal.  Good flexor function with resisted testing with her fingers near extension.  No tenderness over A1 pulleys.  No tenderness with palpation of the carpal canal.  Specialty Comments:  No specialty comments available.  Imaging: No results found.   PMFS History: Patient Active Problem List   Diagnosis Date Noted  . Acute colitis 02/26/2018  . Hypoxemia 02/24/2018  . Dyspnea 11/26/2017  . Anemia 11/26/2017  . Elevated troponin 11/26/2017  . Hyperkalemia 09/29/2017  . Acute respiratory failure ([place]) 09/28/2017  . CAP (community acquired pneumonia) 09/26/2017  . Volume overload 09/25/2017  . Acute on chronic diastolic CHF (congestive heart failure) ([place]) 09/25/2017  . Lactic acidosis 09/25/2017   . Hypokalemia 09/25/2017  . Breast cancer ([place]) 07/22/2017  . SOB (shortness of breath)   . Acute respiratory failure with hypoxia ([place]) 07/21/2017  . Flatulence 10/25/2016  . GERD (gastroesophageal reflux disease) 08/02/2016  . Pyloric stenosis, acquired   . Nausea with vomiting   . Generalized abdominal pain   . Chest pain, rule out acute myocardial infarction 07/04/2016  . Constipation 07/04/2016  . Nausea 07/04/2016  . Essential hypertension 07/04/2016  . HCAP (healthcare-associated pneumonia) 12/04/2015  . End stage renal disease on dialysis ([place]) 12/04/2015  . Diabetes mellitus ([place]) 12/04/2015   Past Medical History:  Diagnosis Date  . Blood transfusion without reported diagnosis   . Cancer ([place])   . Diabetes mellitus without complication ([place])   . Dialysis patient ([place])    mon, wed, friday,   . Hypertension   . OSA (obstructive sleep apnea)   . Renal disorder   . Renal insufficiency     Family History  Problem Relation Age of Onset  . Diabetes Mellitus II Mother   . Hypertension Mother   . Hypertension Sister   . Hypertension Sister   . Colon cancer Neg Hx     Past Surgical History:  Procedure Laterality Date  . ABDOMINAL HYSTERECTOMY    . AV FISTULA PLACEMENT  11/2014   at [facility] N/A 07/10/2016   Procedure: BALLOON DILATION;  Surgeon: Danie Binder, MD;  Location[facility];  Service: Endoscopy;  Laterality: N/A;  Pyloric dilation  . BREAST LUMPECTOMY    . CESAREAN SECTION    . CHOLECYSTECTOMY    . COLONOSCOPY WITH PROPOFOL N/A 09/27/2016   Procedure: COLONOSCOPY WITH PROPOFOL;  Surgeon: Daneil Dolin, MD;  Location: [facility];  Service: Endoscopy;  Laterality: N/A;  2:00  . DILATION AND CURETTAGE OF UTERUS    . ESOPHAGOGASTRODUODENOSCOPY N/A 07/10/2016   Dr.Fields- normal esophagus, gastric stenosis was found at the pylorus, gastritis on bx, normal examined duodenun  . MASTECTOMY     left sided   Social History    Occupational History  . Not on file  Tobacco Use  . Smoking status: Never Smoker  . Smokeless tobacco: Never Used  Substance and Sexual Activity  . Alcohol use: No    Alcohol/week: 0.0 oz  . Drug use: No  . Sexual activity: Not on file

## 2018-03-14 DIAGNOSIS — E1129 Type 2 diabetes mellitus with other diabetic kidney complication: Secondary | ICD-10-CM | POA: Diagnosis not present

## 2018-04-03 ENCOUNTER — Encounter: Payer: Self-pay | Admitting: Gastroenterology

## 2018-04-03 ENCOUNTER — Ambulatory Visit (INDEPENDENT_AMBULATORY_CARE_PROVIDER_SITE_OTHER): Payer: PRIVATE HEALTH INSURANCE | Admitting: Gastroenterology

## 2018-04-03 VITALS — BP 202/82 | HR 76 | Temp 97.4°F | Ht 63.0 in | Wt 244.0 lb

## 2018-04-03 DIAGNOSIS — K529 Noninfective gastroenteritis and colitis, unspecified: Secondary | ICD-10-CM

## 2018-04-03 NOTE — Patient Instructions (Signed)
1. Reduce you Amitiza to 29mcg one to two times daily with food. Samples provided to let you try them out before getting a refill. 2. If your stools continue to be loose, I would like for you to complete stool studies (given your colitis on recent CT).  3. Let me know how you are doing in two weeks.

## 2018-04-03 NOTE — Progress Notes (Signed)
Primary Care Physician: Scherrie Bateman  Primary Gastroenterologist:  Garfield Cornea, MD   Chief Complaint  Patient presents with  . acute colitis    hfu. Loose stools 4-5 times per day    HPI: Patricia [name] is a 47 y.o. female here for hospital follow-up.  She was hospitalized back in March with acute respiratory failure with hypoxia, vomiting, abdominal pain, diarrhea.  CT showed colitis primarily involving the transverse/descending/rectosigmoid colon.  She was empirically given Cipro and Flagyl.  Stool studies were not done.  She was advised to hold Amitiza until after her loose stools resolved.  She was advised to follow-up with GI as an outpatient.  She was last seen by [place] in March 2018.  Historically has a history of constipation.  Colonoscopy up-to-date, October 2017, hemorrhoids only.  Prior to this recent hospitalization she had been doing well on Amitiza.  At one point she did have to take lactulose in addition to the Amitiza to control her constipation.  Since being out of the hospital she tried stopping Amitiza but then she developed constipation again however she notes if she takes it even once daily she has diarrhea 4-5 times a day.  Stools are not like they typically have been.  BP has been up since they recently reduced her nifedinpine.      Current Outpatient Medications  Medication Sig Dispense Refill  . acetaminophen (TYLENOL) 500 MG tablet Take 500 mg by mouth 3 (three) times daily as needed for moderate pain.    Marland Kitchen albuterol (PROVENTIL HFA;VENTOLIN HFA) 108 (90 Base) MCG/ACT inhaler Inhale 2 puffs into the lungs every 6 (six) hours as needed for wheezing or shortness of breath. 1 Inhaler 2  . albuterol (PROVENTIL) (2.5 MG/3ML) 0.083% nebulizer solution Take 3 mLs (2.5 mg total) by nebulization every 6 (six) hours as needed for wheezing or shortness of breath. 75 mL 12  . cinacalcet (SENSIPAR) 30 MG tablet Take 30 mg by mouth daily.    . Ferric Citrate  (AURYXIA) 1 GM 210 MG(Fe) TABS Take 2 tablets by mouth 2 (two) times daily with a meal.    . fluticasone (FLONASE) 50 MCG/ACT nasal spray Place 2 sprays into both nostrils daily. LEFT NARE ONLY    . furosemide (LASIX) 40 MG tablet Take 40-80 mg by mouth 2 (two) times daily. 80mg  in the morning and 40mg  at bedtime    . glipiZIDE (GLUCOTROL) 5 MG tablet Take 5 mg by mouth 2 (two) times daily as needed. For blood sugar levels over 150    . labetalol (NORMODYNE) 200 MG tablet Take 200 mg by mouth 2 (two) times daily.    Marland Kitchen lubiprostone (AMITIZA) 24 MCG capsule Take 24 mcg by mouth 2 (two) times daily with a meal.    . multivitamin (RENA-VIT) TABS tablet Take 1 tablet by mouth daily.    Marland Kitchen NIFEdipine (PROCARDIA-XL/ADALAT CC) 60 MG 24 hr tablet Take 1 tablet (60 mg total) by mouth daily. 60 tablet 0  . ondansetron (ZOFRAN-ODT) 4 MG disintegrating tablet Take 4 mg by mouth every 8 (eight) hours as needed for nausea or vomiting.    . pantoprazole (PROTONIX) 20 MG tablet Take 20 mg by mouth daily.  1  . promethazine (PHENERGAN) 12.5 MG tablet Take 1 tablet (12.5 mg total) by mouth every 6 (six) hours as needed for nausea or vomiting. 30 tablet 0  . rOPINIRole (REQUIP XL) 2 MG 24 hr tablet Take 2 mg by mouth at  bedtime.     . sevelamer carbonate (RENVELA) 800 MG tablet Take 800 mg by mouth See admin instructions. Take 1 tablet in the morning and 1 tablet in the evening. Take 1 tablet if you eat snack.     No current facility-administered medications for this visit.     Allergies as of 04/03/2018 - Review Complete 04/03/2018  Allergen Reaction Noted  . Reglan [metoclopramide] Other (See Comments) 12/04/2015  . Amlodipine besylate Rash and Other (See Comments) 12/04/2015    ROS:  General: Negative for anorexia, weight loss, fever, chills, fatigue, weakness. ENT: Negative for hoarseness, difficulty swallowing , nasal congestion. CV: Negative for chest pain, angina, palpitations, dyspnea on exertion,  peripheral edema.  Respiratory: Negative for dyspnea at rest, dyspnea on exertion, cough, sputum, wheezing.  GI: See history of present illness. GU:  Negative for dysuria, hematuria, urinary incontinence, urinary frequency, nocturnal urination.  Endo: Negative for unusual weight change.    Physical Examination:   BP (!) 202/82 (BP Location: Right Arm, Cuff Size: Large)   Pulse 76   Temp (!) 97.4 F (36.3 C) (Oral)   Ht 5\' 3"  (1.6 m)   Wt 244 lb (110.7 kg)   BMI 43.22 kg/m   General: Well-nourished, well-developed in no acute distress.  Eyes: No icterus. Mouth: Oropharyngeal mucosa moist and pink , no lesions erythema or exudate. Lungs: Clear to auscultation bilaterally.  Heart: Regular rate and rhythm, no murmurs rubs or gallops.  Abdomen: Bowel sounds are normal, nontender, nondistended, no hepatosplenomegaly or masses, no abdominal bruits or hernia , no rebound or guarding.   Extremities: No lower extremity edema. No clubbing or deformities. Neuro: Alert and oriented x 4   Skin: Warm and dry, no jaundice.   Psych: Alert and cooperative, normal mood and affect.  Labs:  Lab Results  Component Value Date   CREATININE 10.69 (H) 02/28/2018   BUN 38 (H) 02/28/2018   NA 136 02/28/2018   K 4.1 02/28/2018   CL 96 (L) 02/28/2018   CO2 22 02/28/2018   Lab Results  Component Value Date   ALT 18 02/24/2018   AST 19 02/24/2018   ALKPHOS 116 02/24/2018   BILITOT 0.9 02/24/2018   Lab Results  Component Value Date   WBC 10.6 (H) 02/28/2018   HGB 9.1 (L) 02/28/2018   HCT 30.2 (L) 02/28/2018   MCV 91.0 02/28/2018   PLT 293 02/28/2018   No results found for: IRON, TIBC, FERRITIN  Imaging Studies: No results found.

## 2018-04-07 ENCOUNTER — Encounter: Payer: Self-pay | Admitting: Gastroenterology

## 2018-04-07 NOTE — Progress Notes (Signed)
CC'ED TO PCP 

## 2018-04-07 NOTE — Assessment & Plan Note (Addendum)
47 y/o female with history of chronic constipation, typically well managed on Amitiza 7mcg bid. Recently hospitalization for respiratory issues, N/V/D. CT with picture of colitis. Treated empirically by attending with Cipro/Flagyl. Symptoms improved although patient notes Amitiza works better than it used too and if she takes it even once daily she is having 4-5 loose stools daily. She tried holding it but then she doesn't have a BM. She has had her nifedipine reduced which may be affecting bowels somewhat but we have to consider possibility of underlying infectious process or inflammatory bowel disease with CT findings.   I have asked her to decrease Amitiza to 62mcg one to two times daily. If diarrhea resolves, we will continue that dose. Samples provided. However if persistent diarrhea, then collect stool studies. She will call in with progress report in 2 weeks and we will decide next step at that time. She was told today, that if persistent diarrhea, negative stool studies, we may have to consider repeat colonoscopy.    BP high today. Patient asymptomatic. Was in a rush getting to the office. Advised her to follow up with her PCP given recent change in BP medication. If symptoms of headache, dizziness, paraesthesias she should go to ED.

## 2018-04-16 DIAGNOSIS — D509 Iron deficiency anemia, unspecified: Secondary | ICD-10-CM | POA: Diagnosis not present

## 2018-04-16 DIAGNOSIS — Z992 Dependence on renal dialysis: Secondary | ICD-10-CM | POA: Diagnosis not present

## 2018-04-16 DIAGNOSIS — D631 Anemia in chronic kidney disease: Secondary | ICD-10-CM | POA: Diagnosis not present

## 2018-04-16 DIAGNOSIS — N186 End stage renal disease: Secondary | ICD-10-CM | POA: Diagnosis not present

## 2018-04-16 DIAGNOSIS — N2581 Secondary hyperparathyroidism of renal origin: Secondary | ICD-10-CM | POA: Diagnosis not present

## 2018-04-17 ENCOUNTER — Ambulatory Visit (INDEPENDENT_AMBULATORY_CARE_PROVIDER_SITE_OTHER): Payer: Medicare Other | Admitting: Orthopaedic Surgery

## 2018-04-18 DIAGNOSIS — N186 End stage renal disease: Secondary | ICD-10-CM | POA: Diagnosis not present

## 2018-04-18 DIAGNOSIS — N2581 Secondary hyperparathyroidism of renal origin: Secondary | ICD-10-CM | POA: Diagnosis not present

## 2018-04-18 DIAGNOSIS — Z992 Dependence on renal dialysis: Secondary | ICD-10-CM | POA: Diagnosis not present

## 2018-04-18 DIAGNOSIS — D509 Iron deficiency anemia, unspecified: Secondary | ICD-10-CM | POA: Diagnosis not present

## 2018-04-18 DIAGNOSIS — D631 Anemia in chronic kidney disease: Secondary | ICD-10-CM | POA: Diagnosis not present

## 2018-04-21 DIAGNOSIS — D509 Iron deficiency anemia, unspecified: Secondary | ICD-10-CM | POA: Diagnosis not present

## 2018-04-21 DIAGNOSIS — D631 Anemia in chronic kidney disease: Secondary | ICD-10-CM | POA: Diagnosis not present

## 2018-04-21 DIAGNOSIS — N2581 Secondary hyperparathyroidism of renal origin: Secondary | ICD-10-CM | POA: Diagnosis not present

## 2018-04-21 DIAGNOSIS — Z992 Dependence on renal dialysis: Secondary | ICD-10-CM | POA: Diagnosis not present

## 2018-04-21 DIAGNOSIS — N186 End stage renal disease: Secondary | ICD-10-CM | POA: Diagnosis not present

## 2018-04-23 DIAGNOSIS — N2581 Secondary hyperparathyroidism of renal origin: Secondary | ICD-10-CM | POA: Diagnosis not present

## 2018-04-23 DIAGNOSIS — Z992 Dependence on renal dialysis: Secondary | ICD-10-CM | POA: Diagnosis not present

## 2018-04-23 DIAGNOSIS — D631 Anemia in chronic kidney disease: Secondary | ICD-10-CM | POA: Diagnosis not present

## 2018-04-23 DIAGNOSIS — N186 End stage renal disease: Secondary | ICD-10-CM | POA: Diagnosis not present

## 2018-04-23 DIAGNOSIS — D509 Iron deficiency anemia, unspecified: Secondary | ICD-10-CM | POA: Diagnosis not present

## 2018-04-25 DIAGNOSIS — N2581 Secondary hyperparathyroidism of renal origin: Secondary | ICD-10-CM | POA: Diagnosis not present

## 2018-04-25 DIAGNOSIS — N186 End stage renal disease: Secondary | ICD-10-CM | POA: Diagnosis not present

## 2018-04-25 DIAGNOSIS — D509 Iron deficiency anemia, unspecified: Secondary | ICD-10-CM | POA: Diagnosis not present

## 2018-04-25 DIAGNOSIS — D631 Anemia in chronic kidney disease: Secondary | ICD-10-CM | POA: Diagnosis not present

## 2018-04-25 DIAGNOSIS — Z992 Dependence on renal dialysis: Secondary | ICD-10-CM | POA: Diagnosis not present

## 2018-04-28 DIAGNOSIS — D509 Iron deficiency anemia, unspecified: Secondary | ICD-10-CM | POA: Diagnosis not present

## 2018-04-28 DIAGNOSIS — D631 Anemia in chronic kidney disease: Secondary | ICD-10-CM | POA: Diagnosis not present

## 2018-04-28 DIAGNOSIS — Z992 Dependence on renal dialysis: Secondary | ICD-10-CM | POA: Diagnosis not present

## 2018-04-28 DIAGNOSIS — N186 End stage renal disease: Secondary | ICD-10-CM | POA: Diagnosis not present

## 2018-04-28 DIAGNOSIS — N2581 Secondary hyperparathyroidism of renal origin: Secondary | ICD-10-CM | POA: Diagnosis not present

## 2018-04-30 DIAGNOSIS — Z992 Dependence on renal dialysis: Secondary | ICD-10-CM | POA: Diagnosis not present

## 2018-04-30 DIAGNOSIS — D509 Iron deficiency anemia, unspecified: Secondary | ICD-10-CM | POA: Diagnosis not present

## 2018-04-30 DIAGNOSIS — N186 End stage renal disease: Secondary | ICD-10-CM | POA: Diagnosis not present

## 2018-04-30 DIAGNOSIS — N2581 Secondary hyperparathyroidism of renal origin: Secondary | ICD-10-CM | POA: Diagnosis not present

## 2018-04-30 DIAGNOSIS — D631 Anemia in chronic kidney disease: Secondary | ICD-10-CM | POA: Diagnosis not present

## 2018-05-01 ENCOUNTER — Encounter (INDEPENDENT_AMBULATORY_CARE_PROVIDER_SITE_OTHER): Payer: Self-pay | Admitting: Orthopaedic Surgery

## 2018-05-01 ENCOUNTER — Ambulatory Visit (INDEPENDENT_AMBULATORY_CARE_PROVIDER_SITE_OTHER): Payer: Medicare Other | Admitting: Orthopaedic Surgery

## 2018-05-01 VITALS — BP 154/84 | HR 96 | Ht 63.0 in | Wt 245.0 lb

## 2018-05-01 DIAGNOSIS — M79642 Pain in left hand: Secondary | ICD-10-CM

## 2018-05-01 NOTE — Progress Notes (Signed)
Office Visit Note   Patient: Patricia [name]           Date of Birth: [birth year]           MRN: [id number] Visit Date: 05/01/2018              Requested by: Jake Samples, PA-C [address] [place], [place] [address] PCP: Jake Samples, PA-C   Assessment & Plan: Visit Diagnoses:  1. Pain in left hand     Plan: Patient can start hand therapy to work on finger range of motion when she is approved by the vascular surgeon to proceed.  She can follow-up with me after that.  Follow-Up Instructions: Return if symptoms worsen or fail to improve.   Orders:  No orders of the defined types were placed in this encounter.  No orders of the defined types were placed in this encounter.     Procedures: No procedures performed   Clinical Data: No additional findings.   Subjective: Chief Complaint  Patient presents with  . Left Hand - Pain, Follow-up    HPI patient returns she states she is 11 pounds with stiffness and discomfort with her fingers problems with flexion of her index finger.  Motion of her fingers seem to be better with distraction.  She denies problems with her opposite right hand.  Her fistula is on the left arm and she was told by vascular surgeon that she left to be checked by the vascular surgeon before she can start occupational therapy for working on finger range of motion.  She already has the prescription for me for OT, and can schedule it when she is approved by vascular to proceed.  Review of Systems reviewed updated unchanged she states the gabapentin really has not helped.   Objective: Vital Signs: BP (!) 154/84   Pulse 96   Ht 5\' 3"  (1.6 m)   Wt 245 lb (111.1 kg)   BMI 43.40 kg/m   Physical Exam  Constitutional: She is oriented to person, place, and time. She appears well-developed.  HENT:  Head: Normocephalic.  Right Ear: External ear normal.  Left Ear: External ear normal.  Eyes: Pupils are equal, round, and reactive to light.    Neck: No tracheal deviation present. No thyromegaly present.  Cardiovascular: Normal rate.  Pulmonary/Chest: Effort normal.  Abdominal: Soft.  Neurological: She is alert and oriented to person, place, and time.  Skin: Skin is warm and dry.  Psychiatric: She has a normal mood and affect. Her behavior is normal.    Ortho Exam full cervical range of motion.  No tenderness over the A1 pulley.  She has more discomfort with attempted flexion of the index finger than adjacent digits left hand.  Good flexion the opposite hand.  No tenderness palpation of the carpal canal.  No hand edema. Specialty Comments:  No specialty comments available.  Imaging: No results found.   PMFS History: Patient Active Problem List   Diagnosis Date Noted  . Acute colitis 02/26/2018  . Hypoxemia 02/24/2018  . Dyspnea 11/26/2017  . Anemia 11/26/2017  . Elevated troponin 11/26/2017  . Hyperkalemia 09/29/2017  . Acute respiratory failure ([place]) 09/28/2017  . CAP (community acquired pneumonia) 09/26/2017  . Volume overload 09/25/2017  . Acute on chronic diastolic CHF (congestive heart failure) ([place]) 09/25/2017  . Lactic acidosis 09/25/2017  . Hypokalemia 09/25/2017  . Breast cancer ([place]) 07/22/2017  . SOB (shortness of breath)   . Acute respiratory failure with hypoxia ([facility] [place]) 07/21/2017  .  Flatulence 10/25/2016  . GERD (gastroesophageal reflux disease) 08/02/2016  . Pyloric stenosis, acquired   . Nausea with vomiting   . Generalized abdominal pain   . Chest pain, rule out acute myocardial infarction 07/04/2016  . Constipation 07/04/2016  . Nausea 07/04/2016  . Essential hypertension 07/04/2016  . HCAP (healthcare-associated pneumonia) 12/04/2015  . End stage renal disease on dialysis ([place]) 12/04/2015  . Diabetes mellitus ([facility]) 12/04/2015   Past Medical History:  Diagnosis Date  . Blood transfusion without reported diagnosis   . Cancer ([place])   . Diabetes mellitus without complication ([place])   .  Dialysis patient ([facility])    mon, wed, friday,   . Hypertension   . OSA (obstructive sleep apnea)   . Renal disorder   . Renal insufficiency     Family History  Problem Relation Age of Onset  . Diabetes Mellitus II Mother   . Hypertension Mother   . Hypertension Sister   . Hypertension Sister   . Colon cancer Neg Hx     Past Surgical History:  Procedure Laterality Date  . ABDOMINAL HYSTERECTOMY    . AV FISTULA PLACEMENT  11/2014   at [facility] N/A 07/10/2016   Procedure: BALLOON DILATION;  Surgeon: Danie Binder, MD;  Location[facility];  Service: Endoscopy;  Laterality: N/A;  Pyloric dilation  . BREAST LUMPECTOMY    . CESAREAN SECTION    . CHOLECYSTECTOMY    . COLONOSCOPY WITH PROPOFOL N/A 09/27/2016   Dr. Gala Romney: Internal hemorrhoids repeat colonoscopy in 10 years  . DILATION AND CURETTAGE OF UTERUS    . ESOPHAGOGASTRODUODENOSCOPY N/A 07/10/2016   Dr.Fields- normal esophagus, gastric stenosis was found at the pylorus, gastritis on bx, normal examined duodenun  . MASTECTOMY     left sided   Social History   Occupational History  . Not on file  Tobacco Use  . Smoking status: Never Smoker  . Smokeless tobacco: Never Used  Substance and Sexual Activity  . Alcohol use: No    Alcohol/week: 0.0 oz  . Drug use: No  . Sexual activity: Not on file

## 2018-05-02 DIAGNOSIS — Z992 Dependence on renal dialysis: Secondary | ICD-10-CM | POA: Diagnosis not present

## 2018-05-02 DIAGNOSIS — D631 Anemia in chronic kidney disease: Secondary | ICD-10-CM | POA: Diagnosis not present

## 2018-05-02 DIAGNOSIS — D509 Iron deficiency anemia, unspecified: Secondary | ICD-10-CM | POA: Diagnosis not present

## 2018-05-02 DIAGNOSIS — N186 End stage renal disease: Secondary | ICD-10-CM | POA: Diagnosis not present

## 2018-05-02 DIAGNOSIS — N2581 Secondary hyperparathyroidism of renal origin: Secondary | ICD-10-CM | POA: Diagnosis not present

## 2018-05-05 DIAGNOSIS — N186 End stage renal disease: Secondary | ICD-10-CM | POA: Diagnosis not present

## 2018-05-05 DIAGNOSIS — N2581 Secondary hyperparathyroidism of renal origin: Secondary | ICD-10-CM | POA: Diagnosis not present

## 2018-05-05 DIAGNOSIS — D631 Anemia in chronic kidney disease: Secondary | ICD-10-CM | POA: Diagnosis not present

## 2018-05-05 DIAGNOSIS — D509 Iron deficiency anemia, unspecified: Secondary | ICD-10-CM | POA: Diagnosis not present

## 2018-05-05 DIAGNOSIS — Z992 Dependence on renal dialysis: Secondary | ICD-10-CM | POA: Diagnosis not present

## 2018-05-06 DIAGNOSIS — D0512 Intraductal carcinoma in situ of left breast: Secondary | ICD-10-CM | POA: Diagnosis not present

## 2018-05-06 DIAGNOSIS — R922 Inconclusive mammogram: Secondary | ICD-10-CM | POA: Diagnosis not present

## 2018-05-06 DIAGNOSIS — Z9012 Acquired absence of left breast and nipple: Secondary | ICD-10-CM | POA: Diagnosis not present

## 2018-05-07 DIAGNOSIS — Z992 Dependence on renal dialysis: Secondary | ICD-10-CM | POA: Diagnosis not present

## 2018-05-07 DIAGNOSIS — N186 End stage renal disease: Secondary | ICD-10-CM | POA: Diagnosis not present

## 2018-05-07 DIAGNOSIS — N2581 Secondary hyperparathyroidism of renal origin: Secondary | ICD-10-CM | POA: Diagnosis not present

## 2018-05-07 DIAGNOSIS — D509 Iron deficiency anemia, unspecified: Secondary | ICD-10-CM | POA: Diagnosis not present

## 2018-05-07 DIAGNOSIS — D631 Anemia in chronic kidney disease: Secondary | ICD-10-CM | POA: Diagnosis not present

## 2018-05-09 DIAGNOSIS — N186 End stage renal disease: Secondary | ICD-10-CM | POA: Diagnosis not present

## 2018-05-09 DIAGNOSIS — Z992 Dependence on renal dialysis: Secondary | ICD-10-CM | POA: Diagnosis not present

## 2018-05-09 DIAGNOSIS — D631 Anemia in chronic kidney disease: Secondary | ICD-10-CM | POA: Diagnosis not present

## 2018-05-09 DIAGNOSIS — D509 Iron deficiency anemia, unspecified: Secondary | ICD-10-CM | POA: Diagnosis not present

## 2018-05-09 DIAGNOSIS — N2581 Secondary hyperparathyroidism of renal origin: Secondary | ICD-10-CM | POA: Diagnosis not present

## 2018-05-12 DIAGNOSIS — D509 Iron deficiency anemia, unspecified: Secondary | ICD-10-CM | POA: Diagnosis not present

## 2018-05-12 DIAGNOSIS — N2581 Secondary hyperparathyroidism of renal origin: Secondary | ICD-10-CM | POA: Diagnosis not present

## 2018-05-12 DIAGNOSIS — N186 End stage renal disease: Secondary | ICD-10-CM | POA: Diagnosis not present

## 2018-05-12 DIAGNOSIS — D631 Anemia in chronic kidney disease: Secondary | ICD-10-CM | POA: Diagnosis not present

## 2018-05-12 DIAGNOSIS — Z992 Dependence on renal dialysis: Secondary | ICD-10-CM | POA: Diagnosis not present

## 2018-05-14 DIAGNOSIS — N186 End stage renal disease: Secondary | ICD-10-CM | POA: Diagnosis not present

## 2018-05-14 DIAGNOSIS — D631 Anemia in chronic kidney disease: Secondary | ICD-10-CM | POA: Diagnosis not present

## 2018-05-14 DIAGNOSIS — Z992 Dependence on renal dialysis: Secondary | ICD-10-CM | POA: Diagnosis not present

## 2018-05-14 DIAGNOSIS — N2581 Secondary hyperparathyroidism of renal origin: Secondary | ICD-10-CM | POA: Diagnosis not present

## 2018-05-14 DIAGNOSIS — D509 Iron deficiency anemia, unspecified: Secondary | ICD-10-CM | POA: Diagnosis not present

## 2018-05-16 DIAGNOSIS — D631 Anemia in chronic kidney disease: Secondary | ICD-10-CM | POA: Diagnosis not present

## 2018-05-16 DIAGNOSIS — D509 Iron deficiency anemia, unspecified: Secondary | ICD-10-CM | POA: Diagnosis not present

## 2018-05-16 DIAGNOSIS — Z992 Dependence on renal dialysis: Secondary | ICD-10-CM | POA: Diagnosis not present

## 2018-05-16 DIAGNOSIS — N2581 Secondary hyperparathyroidism of renal origin: Secondary | ICD-10-CM | POA: Diagnosis not present

## 2018-05-16 DIAGNOSIS — N186 End stage renal disease: Secondary | ICD-10-CM | POA: Diagnosis not present

## 2018-05-19 DIAGNOSIS — N186 End stage renal disease: Secondary | ICD-10-CM | POA: Diagnosis not present

## 2018-05-19 DIAGNOSIS — D509 Iron deficiency anemia, unspecified: Secondary | ICD-10-CM | POA: Diagnosis not present

## 2018-05-19 DIAGNOSIS — D631 Anemia in chronic kidney disease: Secondary | ICD-10-CM | POA: Diagnosis not present

## 2018-05-19 DIAGNOSIS — N2581 Secondary hyperparathyroidism of renal origin: Secondary | ICD-10-CM | POA: Diagnosis not present

## 2018-05-19 DIAGNOSIS — Z992 Dependence on renal dialysis: Secondary | ICD-10-CM | POA: Diagnosis not present

## 2018-05-21 DIAGNOSIS — D509 Iron deficiency anemia, unspecified: Secondary | ICD-10-CM | POA: Diagnosis not present

## 2018-05-21 DIAGNOSIS — N2581 Secondary hyperparathyroidism of renal origin: Secondary | ICD-10-CM | POA: Diagnosis not present

## 2018-05-21 DIAGNOSIS — Z992 Dependence on renal dialysis: Secondary | ICD-10-CM | POA: Diagnosis not present

## 2018-05-21 DIAGNOSIS — N186 End stage renal disease: Secondary | ICD-10-CM | POA: Diagnosis not present

## 2018-05-21 DIAGNOSIS — D631 Anemia in chronic kidney disease: Secondary | ICD-10-CM | POA: Diagnosis not present

## 2018-05-23 DIAGNOSIS — N2581 Secondary hyperparathyroidism of renal origin: Secondary | ICD-10-CM | POA: Diagnosis not present

## 2018-05-23 DIAGNOSIS — D509 Iron deficiency anemia, unspecified: Secondary | ICD-10-CM | POA: Diagnosis not present

## 2018-05-23 DIAGNOSIS — Z992 Dependence on renal dialysis: Secondary | ICD-10-CM | POA: Diagnosis not present

## 2018-05-23 DIAGNOSIS — N186 End stage renal disease: Secondary | ICD-10-CM | POA: Diagnosis not present

## 2018-05-23 DIAGNOSIS — D631 Anemia in chronic kidney disease: Secondary | ICD-10-CM | POA: Diagnosis not present

## 2018-05-26 DIAGNOSIS — N2581 Secondary hyperparathyroidism of renal origin: Secondary | ICD-10-CM | POA: Diagnosis not present

## 2018-05-26 DIAGNOSIS — N186 End stage renal disease: Secondary | ICD-10-CM | POA: Diagnosis not present

## 2018-05-26 DIAGNOSIS — D631 Anemia in chronic kidney disease: Secondary | ICD-10-CM | POA: Diagnosis not present

## 2018-05-26 DIAGNOSIS — D509 Iron deficiency anemia, unspecified: Secondary | ICD-10-CM | POA: Diagnosis not present

## 2018-05-28 DIAGNOSIS — N2581 Secondary hyperparathyroidism of renal origin: Secondary | ICD-10-CM | POA: Diagnosis not present

## 2018-05-28 DIAGNOSIS — N186 End stage renal disease: Secondary | ICD-10-CM | POA: Diagnosis not present

## 2018-05-28 DIAGNOSIS — D631 Anemia in chronic kidney disease: Secondary | ICD-10-CM | POA: Diagnosis not present

## 2018-05-28 DIAGNOSIS — D509 Iron deficiency anemia, unspecified: Secondary | ICD-10-CM | POA: Diagnosis not present

## 2018-05-30 DIAGNOSIS — D509 Iron deficiency anemia, unspecified: Secondary | ICD-10-CM | POA: Diagnosis not present

## 2018-05-30 DIAGNOSIS — N2581 Secondary hyperparathyroidism of renal origin: Secondary | ICD-10-CM | POA: Diagnosis not present

## 2018-05-30 DIAGNOSIS — D631 Anemia in chronic kidney disease: Secondary | ICD-10-CM | POA: Diagnosis not present

## 2018-05-30 DIAGNOSIS — N186 End stage renal disease: Secondary | ICD-10-CM | POA: Diagnosis not present

## 2018-06-02 DIAGNOSIS — N2581 Secondary hyperparathyroidism of renal origin: Secondary | ICD-10-CM | POA: Diagnosis not present

## 2018-06-02 DIAGNOSIS — N186 End stage renal disease: Secondary | ICD-10-CM | POA: Diagnosis not present

## 2018-06-02 DIAGNOSIS — Z992 Dependence on renal dialysis: Secondary | ICD-10-CM | POA: Diagnosis not present

## 2018-06-02 DIAGNOSIS — D631 Anemia in chronic kidney disease: Secondary | ICD-10-CM | POA: Diagnosis not present

## 2018-06-02 DIAGNOSIS — D509 Iron deficiency anemia, unspecified: Secondary | ICD-10-CM | POA: Diagnosis not present

## 2018-06-04 DIAGNOSIS — N2581 Secondary hyperparathyroidism of renal origin: Secondary | ICD-10-CM | POA: Diagnosis not present

## 2018-06-04 DIAGNOSIS — N186 End stage renal disease: Secondary | ICD-10-CM | POA: Diagnosis not present

## 2018-06-04 DIAGNOSIS — D631 Anemia in chronic kidney disease: Secondary | ICD-10-CM | POA: Diagnosis not present

## 2018-06-04 DIAGNOSIS — D509 Iron deficiency anemia, unspecified: Secondary | ICD-10-CM | POA: Diagnosis not present

## 2018-06-04 DIAGNOSIS — Z992 Dependence on renal dialysis: Secondary | ICD-10-CM | POA: Diagnosis not present

## 2018-06-06 DIAGNOSIS — N186 End stage renal disease: Secondary | ICD-10-CM | POA: Diagnosis not present

## 2018-06-06 DIAGNOSIS — Z992 Dependence on renal dialysis: Secondary | ICD-10-CM | POA: Diagnosis not present

## 2018-06-06 DIAGNOSIS — D509 Iron deficiency anemia, unspecified: Secondary | ICD-10-CM | POA: Diagnosis not present

## 2018-06-06 DIAGNOSIS — D631 Anemia in chronic kidney disease: Secondary | ICD-10-CM | POA: Diagnosis not present

## 2018-06-06 DIAGNOSIS — N2581 Secondary hyperparathyroidism of renal origin: Secondary | ICD-10-CM | POA: Diagnosis not present

## 2018-06-09 DIAGNOSIS — Z992 Dependence on renal dialysis: Secondary | ICD-10-CM | POA: Diagnosis not present

## 2018-06-09 DIAGNOSIS — N186 End stage renal disease: Secondary | ICD-10-CM | POA: Diagnosis not present

## 2018-06-09 DIAGNOSIS — N2581 Secondary hyperparathyroidism of renal origin: Secondary | ICD-10-CM | POA: Diagnosis not present

## 2018-06-09 DIAGNOSIS — D509 Iron deficiency anemia, unspecified: Secondary | ICD-10-CM | POA: Diagnosis not present

## 2018-06-09 DIAGNOSIS — D631 Anemia in chronic kidney disease: Secondary | ICD-10-CM | POA: Diagnosis not present

## 2018-06-10 ENCOUNTER — Ambulatory Visit: Payer: PRIVATE HEALTH INSURANCE | Admitting: Gastroenterology

## 2018-06-11 DIAGNOSIS — Z992 Dependence on renal dialysis: Secondary | ICD-10-CM | POA: Diagnosis not present

## 2018-06-11 DIAGNOSIS — N186 End stage renal disease: Secondary | ICD-10-CM | POA: Diagnosis not present

## 2018-06-11 DIAGNOSIS — D631 Anemia in chronic kidney disease: Secondary | ICD-10-CM | POA: Diagnosis not present

## 2018-06-11 DIAGNOSIS — N2581 Secondary hyperparathyroidism of renal origin: Secondary | ICD-10-CM | POA: Diagnosis not present

## 2018-06-11 DIAGNOSIS — D509 Iron deficiency anemia, unspecified: Secondary | ICD-10-CM | POA: Diagnosis not present

## 2018-06-13 DIAGNOSIS — N2581 Secondary hyperparathyroidism of renal origin: Secondary | ICD-10-CM | POA: Diagnosis not present

## 2018-06-13 DIAGNOSIS — D631 Anemia in chronic kidney disease: Secondary | ICD-10-CM | POA: Diagnosis not present

## 2018-06-13 DIAGNOSIS — Z992 Dependence on renal dialysis: Secondary | ICD-10-CM | POA: Diagnosis not present

## 2018-06-13 DIAGNOSIS — D509 Iron deficiency anemia, unspecified: Secondary | ICD-10-CM | POA: Diagnosis not present

## 2018-06-13 DIAGNOSIS — N186 End stage renal disease: Secondary | ICD-10-CM | POA: Diagnosis not present

## 2018-06-16 DIAGNOSIS — N2581 Secondary hyperparathyroidism of renal origin: Secondary | ICD-10-CM | POA: Diagnosis not present

## 2018-06-16 DIAGNOSIS — Z992 Dependence on renal dialysis: Secondary | ICD-10-CM | POA: Diagnosis not present

## 2018-06-16 DIAGNOSIS — N186 End stage renal disease: Secondary | ICD-10-CM | POA: Diagnosis not present

## 2018-06-16 DIAGNOSIS — D509 Iron deficiency anemia, unspecified: Secondary | ICD-10-CM | POA: Diagnosis not present

## 2018-06-18 DIAGNOSIS — Z992 Dependence on renal dialysis: Secondary | ICD-10-CM | POA: Diagnosis not present

## 2018-06-18 DIAGNOSIS — N186 End stage renal disease: Secondary | ICD-10-CM | POA: Diagnosis not present

## 2018-06-18 DIAGNOSIS — N2581 Secondary hyperparathyroidism of renal origin: Secondary | ICD-10-CM | POA: Diagnosis not present

## 2018-06-18 DIAGNOSIS — D509 Iron deficiency anemia, unspecified: Secondary | ICD-10-CM | POA: Diagnosis not present

## 2018-06-20 DIAGNOSIS — D509 Iron deficiency anemia, unspecified: Secondary | ICD-10-CM | POA: Diagnosis not present

## 2018-06-20 DIAGNOSIS — N186 End stage renal disease: Secondary | ICD-10-CM | POA: Diagnosis not present

## 2018-06-20 DIAGNOSIS — Z992 Dependence on renal dialysis: Secondary | ICD-10-CM | POA: Diagnosis not present

## 2018-06-20 DIAGNOSIS — N2581 Secondary hyperparathyroidism of renal origin: Secondary | ICD-10-CM | POA: Diagnosis not present

## 2018-06-23 DIAGNOSIS — N186 End stage renal disease: Secondary | ICD-10-CM | POA: Diagnosis not present

## 2018-06-23 DIAGNOSIS — D509 Iron deficiency anemia, unspecified: Secondary | ICD-10-CM | POA: Diagnosis not present

## 2018-06-23 DIAGNOSIS — Z992 Dependence on renal dialysis: Secondary | ICD-10-CM | POA: Diagnosis not present

## 2018-06-23 DIAGNOSIS — N2581 Secondary hyperparathyroidism of renal origin: Secondary | ICD-10-CM | POA: Diagnosis not present

## 2018-06-25 DIAGNOSIS — N186 End stage renal disease: Secondary | ICD-10-CM | POA: Diagnosis not present

## 2018-06-25 DIAGNOSIS — D509 Iron deficiency anemia, unspecified: Secondary | ICD-10-CM | POA: Diagnosis not present

## 2018-06-25 DIAGNOSIS — Z992 Dependence on renal dialysis: Secondary | ICD-10-CM | POA: Diagnosis not present

## 2018-06-25 DIAGNOSIS — N2581 Secondary hyperparathyroidism of renal origin: Secondary | ICD-10-CM | POA: Diagnosis not present

## 2018-06-27 DIAGNOSIS — Z992 Dependence on renal dialysis: Secondary | ICD-10-CM | POA: Diagnosis not present

## 2018-06-27 DIAGNOSIS — D509 Iron deficiency anemia, unspecified: Secondary | ICD-10-CM | POA: Diagnosis not present

## 2018-06-27 DIAGNOSIS — N2581 Secondary hyperparathyroidism of renal origin: Secondary | ICD-10-CM | POA: Diagnosis not present

## 2018-06-27 DIAGNOSIS — N186 End stage renal disease: Secondary | ICD-10-CM | POA: Diagnosis not present

## 2018-06-30 DIAGNOSIS — N2581 Secondary hyperparathyroidism of renal origin: Secondary | ICD-10-CM | POA: Diagnosis not present

## 2018-06-30 DIAGNOSIS — Z992 Dependence on renal dialysis: Secondary | ICD-10-CM | POA: Diagnosis not present

## 2018-06-30 DIAGNOSIS — N186 End stage renal disease: Secondary | ICD-10-CM | POA: Diagnosis not present

## 2018-06-30 DIAGNOSIS — D509 Iron deficiency anemia, unspecified: Secondary | ICD-10-CM | POA: Diagnosis not present

## 2018-07-01 DIAGNOSIS — Z01419 Encounter for gynecological examination (general) (routine) without abnormal findings: Secondary | ICD-10-CM | POA: Diagnosis not present

## 2018-07-02 DIAGNOSIS — N186 End stage renal disease: Secondary | ICD-10-CM | POA: Diagnosis not present

## 2018-07-02 DIAGNOSIS — N2581 Secondary hyperparathyroidism of renal origin: Secondary | ICD-10-CM | POA: Diagnosis not present

## 2018-07-02 DIAGNOSIS — Z992 Dependence on renal dialysis: Secondary | ICD-10-CM | POA: Diagnosis not present

## 2018-07-02 DIAGNOSIS — D509 Iron deficiency anemia, unspecified: Secondary | ICD-10-CM | POA: Diagnosis not present

## 2018-07-04 DIAGNOSIS — D509 Iron deficiency anemia, unspecified: Secondary | ICD-10-CM | POA: Diagnosis not present

## 2018-07-04 DIAGNOSIS — N186 End stage renal disease: Secondary | ICD-10-CM | POA: Diagnosis not present

## 2018-07-04 DIAGNOSIS — N2581 Secondary hyperparathyroidism of renal origin: Secondary | ICD-10-CM | POA: Diagnosis not present

## 2018-07-04 DIAGNOSIS — Z992 Dependence on renal dialysis: Secondary | ICD-10-CM | POA: Diagnosis not present

## 2018-07-07 DIAGNOSIS — Z992 Dependence on renal dialysis: Secondary | ICD-10-CM | POA: Diagnosis not present

## 2018-07-07 DIAGNOSIS — N2581 Secondary hyperparathyroidism of renal origin: Secondary | ICD-10-CM | POA: Diagnosis not present

## 2018-07-07 DIAGNOSIS — N186 End stage renal disease: Secondary | ICD-10-CM | POA: Diagnosis not present

## 2018-07-07 DIAGNOSIS — D509 Iron deficiency anemia, unspecified: Secondary | ICD-10-CM | POA: Diagnosis not present

## 2018-07-09 DIAGNOSIS — Z992 Dependence on renal dialysis: Secondary | ICD-10-CM | POA: Diagnosis not present

## 2018-07-09 DIAGNOSIS — N2581 Secondary hyperparathyroidism of renal origin: Secondary | ICD-10-CM | POA: Diagnosis not present

## 2018-07-09 DIAGNOSIS — D509 Iron deficiency anemia, unspecified: Secondary | ICD-10-CM | POA: Diagnosis not present

## 2018-07-09 DIAGNOSIS — N186 End stage renal disease: Secondary | ICD-10-CM | POA: Diagnosis not present

## 2018-07-11 DIAGNOSIS — N186 End stage renal disease: Secondary | ICD-10-CM | POA: Diagnosis not present

## 2018-07-11 DIAGNOSIS — N2581 Secondary hyperparathyroidism of renal origin: Secondary | ICD-10-CM | POA: Diagnosis not present

## 2018-07-11 DIAGNOSIS — Z992 Dependence on renal dialysis: Secondary | ICD-10-CM | POA: Diagnosis not present

## 2018-07-11 DIAGNOSIS — D509 Iron deficiency anemia, unspecified: Secondary | ICD-10-CM | POA: Diagnosis not present

## 2018-07-14 DIAGNOSIS — Z992 Dependence on renal dialysis: Secondary | ICD-10-CM | POA: Diagnosis not present

## 2018-07-14 DIAGNOSIS — N2581 Secondary hyperparathyroidism of renal origin: Secondary | ICD-10-CM | POA: Diagnosis not present

## 2018-07-14 DIAGNOSIS — N186 End stage renal disease: Secondary | ICD-10-CM | POA: Diagnosis not present

## 2018-07-14 DIAGNOSIS — D509 Iron deficiency anemia, unspecified: Secondary | ICD-10-CM | POA: Diagnosis not present

## 2018-07-16 DIAGNOSIS — Z992 Dependence on renal dialysis: Secondary | ICD-10-CM | POA: Diagnosis not present

## 2018-07-16 DIAGNOSIS — D509 Iron deficiency anemia, unspecified: Secondary | ICD-10-CM | POA: Diagnosis not present

## 2018-07-16 DIAGNOSIS — N186 End stage renal disease: Secondary | ICD-10-CM | POA: Diagnosis not present

## 2018-07-16 DIAGNOSIS — N2581 Secondary hyperparathyroidism of renal origin: Secondary | ICD-10-CM | POA: Diagnosis not present

## 2018-07-18 DIAGNOSIS — D509 Iron deficiency anemia, unspecified: Secondary | ICD-10-CM | POA: Diagnosis not present

## 2018-07-18 DIAGNOSIS — N2581 Secondary hyperparathyroidism of renal origin: Secondary | ICD-10-CM | POA: Diagnosis not present

## 2018-07-18 DIAGNOSIS — Z992 Dependence on renal dialysis: Secondary | ICD-10-CM | POA: Diagnosis not present

## 2018-07-18 DIAGNOSIS — D631 Anemia in chronic kidney disease: Secondary | ICD-10-CM | POA: Diagnosis not present

## 2018-07-18 DIAGNOSIS — R112 Nausea with vomiting, unspecified: Secondary | ICD-10-CM | POA: Diagnosis not present

## 2018-07-18 DIAGNOSIS — N186 End stage renal disease: Secondary | ICD-10-CM | POA: Diagnosis not present

## 2018-07-21 DIAGNOSIS — R112 Nausea with vomiting, unspecified: Secondary | ICD-10-CM | POA: Diagnosis not present

## 2018-07-21 DIAGNOSIS — D509 Iron deficiency anemia, unspecified: Secondary | ICD-10-CM | POA: Diagnosis not present

## 2018-07-21 DIAGNOSIS — Z992 Dependence on renal dialysis: Secondary | ICD-10-CM | POA: Diagnosis not present

## 2018-07-21 DIAGNOSIS — N186 End stage renal disease: Secondary | ICD-10-CM | POA: Diagnosis not present

## 2018-07-21 DIAGNOSIS — N2581 Secondary hyperparathyroidism of renal origin: Secondary | ICD-10-CM | POA: Diagnosis not present

## 2018-07-21 DIAGNOSIS — D631 Anemia in chronic kidney disease: Secondary | ICD-10-CM | POA: Diagnosis not present

## 2018-07-22 ENCOUNTER — Other Ambulatory Visit: Payer: Self-pay | Admitting: Nurse Practitioner

## 2018-07-23 DIAGNOSIS — N2581 Secondary hyperparathyroidism of renal origin: Secondary | ICD-10-CM | POA: Diagnosis not present

## 2018-07-23 DIAGNOSIS — D631 Anemia in chronic kidney disease: Secondary | ICD-10-CM | POA: Diagnosis not present

## 2018-07-23 DIAGNOSIS — R112 Nausea with vomiting, unspecified: Secondary | ICD-10-CM | POA: Diagnosis not present

## 2018-07-23 DIAGNOSIS — D509 Iron deficiency anemia, unspecified: Secondary | ICD-10-CM | POA: Diagnosis not present

## 2018-07-23 DIAGNOSIS — N186 End stage renal disease: Secondary | ICD-10-CM | POA: Diagnosis not present

## 2018-07-23 DIAGNOSIS — Z992 Dependence on renal dialysis: Secondary | ICD-10-CM | POA: Diagnosis not present

## 2018-07-25 DIAGNOSIS — D509 Iron deficiency anemia, unspecified: Secondary | ICD-10-CM | POA: Diagnosis not present

## 2018-07-25 DIAGNOSIS — N2581 Secondary hyperparathyroidism of renal origin: Secondary | ICD-10-CM | POA: Diagnosis not present

## 2018-07-25 DIAGNOSIS — R112 Nausea with vomiting, unspecified: Secondary | ICD-10-CM | POA: Diagnosis not present

## 2018-07-25 DIAGNOSIS — N186 End stage renal disease: Secondary | ICD-10-CM | POA: Diagnosis not present

## 2018-07-25 DIAGNOSIS — D631 Anemia in chronic kidney disease: Secondary | ICD-10-CM | POA: Diagnosis not present

## 2018-07-25 DIAGNOSIS — Z992 Dependence on renal dialysis: Secondary | ICD-10-CM | POA: Diagnosis not present

## 2018-07-28 DIAGNOSIS — R112 Nausea with vomiting, unspecified: Secondary | ICD-10-CM | POA: Diagnosis not present

## 2018-07-28 DIAGNOSIS — D509 Iron deficiency anemia, unspecified: Secondary | ICD-10-CM | POA: Diagnosis not present

## 2018-07-28 DIAGNOSIS — N186 End stage renal disease: Secondary | ICD-10-CM | POA: Diagnosis not present

## 2018-07-28 DIAGNOSIS — D631 Anemia in chronic kidney disease: Secondary | ICD-10-CM | POA: Diagnosis not present

## 2018-07-28 DIAGNOSIS — N2581 Secondary hyperparathyroidism of renal origin: Secondary | ICD-10-CM | POA: Diagnosis not present

## 2018-07-28 DIAGNOSIS — Z992 Dependence on renal dialysis: Secondary | ICD-10-CM | POA: Diagnosis not present

## 2018-07-30 DIAGNOSIS — D631 Anemia in chronic kidney disease: Secondary | ICD-10-CM | POA: Diagnosis not present

## 2018-07-30 DIAGNOSIS — N186 End stage renal disease: Secondary | ICD-10-CM | POA: Diagnosis not present

## 2018-07-30 DIAGNOSIS — N2581 Secondary hyperparathyroidism of renal origin: Secondary | ICD-10-CM | POA: Diagnosis not present

## 2018-07-30 DIAGNOSIS — Z992 Dependence on renal dialysis: Secondary | ICD-10-CM | POA: Diagnosis not present

## 2018-07-30 DIAGNOSIS — R112 Nausea with vomiting, unspecified: Secondary | ICD-10-CM | POA: Diagnosis not present

## 2018-07-30 DIAGNOSIS — D509 Iron deficiency anemia, unspecified: Secondary | ICD-10-CM | POA: Diagnosis not present

## 2018-08-01 DIAGNOSIS — N186 End stage renal disease: Secondary | ICD-10-CM | POA: Diagnosis not present

## 2018-08-01 DIAGNOSIS — D631 Anemia in chronic kidney disease: Secondary | ICD-10-CM | POA: Diagnosis not present

## 2018-08-01 DIAGNOSIS — D509 Iron deficiency anemia, unspecified: Secondary | ICD-10-CM | POA: Diagnosis not present

## 2018-08-01 DIAGNOSIS — Z992 Dependence on renal dialysis: Secondary | ICD-10-CM | POA: Diagnosis not present

## 2018-08-01 DIAGNOSIS — N2581 Secondary hyperparathyroidism of renal origin: Secondary | ICD-10-CM | POA: Diagnosis not present

## 2018-08-01 DIAGNOSIS — R112 Nausea with vomiting, unspecified: Secondary | ICD-10-CM | POA: Diagnosis not present

## 2018-08-04 DIAGNOSIS — N2581 Secondary hyperparathyroidism of renal origin: Secondary | ICD-10-CM | POA: Diagnosis not present

## 2018-08-04 DIAGNOSIS — Z992 Dependence on renal dialysis: Secondary | ICD-10-CM | POA: Diagnosis not present

## 2018-08-04 DIAGNOSIS — D631 Anemia in chronic kidney disease: Secondary | ICD-10-CM | POA: Diagnosis not present

## 2018-08-04 DIAGNOSIS — N186 End stage renal disease: Secondary | ICD-10-CM | POA: Diagnosis not present

## 2018-08-04 DIAGNOSIS — R112 Nausea with vomiting, unspecified: Secondary | ICD-10-CM | POA: Diagnosis not present

## 2018-08-04 DIAGNOSIS — D509 Iron deficiency anemia, unspecified: Secondary | ICD-10-CM | POA: Diagnosis not present

## 2018-08-06 DIAGNOSIS — N186 End stage renal disease: Secondary | ICD-10-CM | POA: Diagnosis not present

## 2018-08-06 DIAGNOSIS — D509 Iron deficiency anemia, unspecified: Secondary | ICD-10-CM | POA: Diagnosis not present

## 2018-08-06 DIAGNOSIS — Z992 Dependence on renal dialysis: Secondary | ICD-10-CM | POA: Diagnosis not present

## 2018-08-06 DIAGNOSIS — R112 Nausea with vomiting, unspecified: Secondary | ICD-10-CM | POA: Diagnosis not present

## 2018-08-06 DIAGNOSIS — N2581 Secondary hyperparathyroidism of renal origin: Secondary | ICD-10-CM | POA: Diagnosis not present

## 2018-08-06 DIAGNOSIS — D631 Anemia in chronic kidney disease: Secondary | ICD-10-CM | POA: Diagnosis not present

## 2018-08-08 DIAGNOSIS — N2581 Secondary hyperparathyroidism of renal origin: Secondary | ICD-10-CM | POA: Diagnosis not present

## 2018-08-08 DIAGNOSIS — D631 Anemia in chronic kidney disease: Secondary | ICD-10-CM | POA: Diagnosis not present

## 2018-08-08 DIAGNOSIS — N186 End stage renal disease: Secondary | ICD-10-CM | POA: Diagnosis not present

## 2018-08-08 DIAGNOSIS — R112 Nausea with vomiting, unspecified: Secondary | ICD-10-CM | POA: Diagnosis not present

## 2018-08-08 DIAGNOSIS — Z992 Dependence on renal dialysis: Secondary | ICD-10-CM | POA: Diagnosis not present

## 2018-08-08 DIAGNOSIS — D509 Iron deficiency anemia, unspecified: Secondary | ICD-10-CM | POA: Diagnosis not present

## 2018-08-11 DIAGNOSIS — D631 Anemia in chronic kidney disease: Secondary | ICD-10-CM | POA: Diagnosis not present

## 2018-08-11 DIAGNOSIS — N2581 Secondary hyperparathyroidism of renal origin: Secondary | ICD-10-CM | POA: Diagnosis not present

## 2018-08-11 DIAGNOSIS — R112 Nausea with vomiting, unspecified: Secondary | ICD-10-CM | POA: Diagnosis not present

## 2018-08-11 DIAGNOSIS — N186 End stage renal disease: Secondary | ICD-10-CM | POA: Diagnosis not present

## 2018-08-11 DIAGNOSIS — D509 Iron deficiency anemia, unspecified: Secondary | ICD-10-CM | POA: Diagnosis not present

## 2018-08-11 DIAGNOSIS — Z992 Dependence on renal dialysis: Secondary | ICD-10-CM | POA: Diagnosis not present

## 2018-08-13 DIAGNOSIS — R112 Nausea with vomiting, unspecified: Secondary | ICD-10-CM | POA: Diagnosis not present

## 2018-08-13 DIAGNOSIS — N2581 Secondary hyperparathyroidism of renal origin: Secondary | ICD-10-CM | POA: Diagnosis not present

## 2018-08-13 DIAGNOSIS — D631 Anemia in chronic kidney disease: Secondary | ICD-10-CM | POA: Diagnosis not present

## 2018-08-13 DIAGNOSIS — Z992 Dependence on renal dialysis: Secondary | ICD-10-CM | POA: Diagnosis not present

## 2018-08-13 DIAGNOSIS — N186 End stage renal disease: Secondary | ICD-10-CM | POA: Diagnosis not present

## 2018-08-13 DIAGNOSIS — D509 Iron deficiency anemia, unspecified: Secondary | ICD-10-CM | POA: Diagnosis not present

## 2018-08-15 DIAGNOSIS — D509 Iron deficiency anemia, unspecified: Secondary | ICD-10-CM | POA: Diagnosis not present

## 2018-08-15 DIAGNOSIS — Z992 Dependence on renal dialysis: Secondary | ICD-10-CM | POA: Diagnosis not present

## 2018-08-15 DIAGNOSIS — D631 Anemia in chronic kidney disease: Secondary | ICD-10-CM | POA: Diagnosis not present

## 2018-08-15 DIAGNOSIS — N186 End stage renal disease: Secondary | ICD-10-CM | POA: Diagnosis not present

## 2018-08-15 DIAGNOSIS — N2581 Secondary hyperparathyroidism of renal origin: Secondary | ICD-10-CM | POA: Diagnosis not present

## 2018-08-15 DIAGNOSIS — R112 Nausea with vomiting, unspecified: Secondary | ICD-10-CM | POA: Diagnosis not present

## 2018-08-18 DIAGNOSIS — D631 Anemia in chronic kidney disease: Secondary | ICD-10-CM | POA: Diagnosis not present

## 2018-08-18 DIAGNOSIS — N186 End stage renal disease: Secondary | ICD-10-CM | POA: Diagnosis not present

## 2018-08-18 DIAGNOSIS — E8779 Other fluid overload: Secondary | ICD-10-CM | POA: Diagnosis not present

## 2018-08-18 DIAGNOSIS — Z992 Dependence on renal dialysis: Secondary | ICD-10-CM | POA: Diagnosis not present

## 2018-08-18 DIAGNOSIS — D509 Iron deficiency anemia, unspecified: Secondary | ICD-10-CM | POA: Diagnosis not present

## 2018-08-18 DIAGNOSIS — R112 Nausea with vomiting, unspecified: Secondary | ICD-10-CM | POA: Diagnosis not present

## 2018-08-18 DIAGNOSIS — N2581 Secondary hyperparathyroidism of renal origin: Secondary | ICD-10-CM | POA: Diagnosis not present

## 2018-08-20 DIAGNOSIS — N186 End stage renal disease: Secondary | ICD-10-CM | POA: Diagnosis not present

## 2018-08-20 DIAGNOSIS — R112 Nausea with vomiting, unspecified: Secondary | ICD-10-CM | POA: Diagnosis not present

## 2018-08-20 DIAGNOSIS — D631 Anemia in chronic kidney disease: Secondary | ICD-10-CM | POA: Diagnosis not present

## 2018-08-20 DIAGNOSIS — N2581 Secondary hyperparathyroidism of renal origin: Secondary | ICD-10-CM | POA: Diagnosis not present

## 2018-08-20 DIAGNOSIS — Z992 Dependence on renal dialysis: Secondary | ICD-10-CM | POA: Diagnosis not present

## 2018-08-20 DIAGNOSIS — D509 Iron deficiency anemia, unspecified: Secondary | ICD-10-CM | POA: Diagnosis not present

## 2018-08-22 DIAGNOSIS — D509 Iron deficiency anemia, unspecified: Secondary | ICD-10-CM | POA: Diagnosis not present

## 2018-08-22 DIAGNOSIS — D631 Anemia in chronic kidney disease: Secondary | ICD-10-CM | POA: Diagnosis not present

## 2018-08-22 DIAGNOSIS — N186 End stage renal disease: Secondary | ICD-10-CM | POA: Diagnosis not present

## 2018-08-22 DIAGNOSIS — R112 Nausea with vomiting, unspecified: Secondary | ICD-10-CM | POA: Diagnosis not present

## 2018-08-22 DIAGNOSIS — N2581 Secondary hyperparathyroidism of renal origin: Secondary | ICD-10-CM | POA: Diagnosis not present

## 2018-08-22 DIAGNOSIS — Z992 Dependence on renal dialysis: Secondary | ICD-10-CM | POA: Diagnosis not present

## 2018-08-25 DIAGNOSIS — D631 Anemia in chronic kidney disease: Secondary | ICD-10-CM | POA: Diagnosis not present

## 2018-08-25 DIAGNOSIS — N186 End stage renal disease: Secondary | ICD-10-CM | POA: Diagnosis not present

## 2018-08-25 DIAGNOSIS — Z992 Dependence on renal dialysis: Secondary | ICD-10-CM | POA: Diagnosis not present

## 2018-08-25 DIAGNOSIS — D509 Iron deficiency anemia, unspecified: Secondary | ICD-10-CM | POA: Diagnosis not present

## 2018-08-25 DIAGNOSIS — N2581 Secondary hyperparathyroidism of renal origin: Secondary | ICD-10-CM | POA: Diagnosis not present

## 2018-08-25 DIAGNOSIS — R112 Nausea with vomiting, unspecified: Secondary | ICD-10-CM | POA: Diagnosis not present

## 2018-08-27 DIAGNOSIS — R112 Nausea with vomiting, unspecified: Secondary | ICD-10-CM | POA: Diagnosis not present

## 2018-08-27 DIAGNOSIS — N186 End stage renal disease: Secondary | ICD-10-CM | POA: Diagnosis not present

## 2018-08-27 DIAGNOSIS — N2581 Secondary hyperparathyroidism of renal origin: Secondary | ICD-10-CM | POA: Diagnosis not present

## 2018-08-27 DIAGNOSIS — Z992 Dependence on renal dialysis: Secondary | ICD-10-CM | POA: Diagnosis not present

## 2018-08-27 DIAGNOSIS — D509 Iron deficiency anemia, unspecified: Secondary | ICD-10-CM | POA: Diagnosis not present

## 2018-08-27 DIAGNOSIS — D631 Anemia in chronic kidney disease: Secondary | ICD-10-CM | POA: Diagnosis not present

## 2018-08-29 DIAGNOSIS — D509 Iron deficiency anemia, unspecified: Secondary | ICD-10-CM | POA: Diagnosis not present

## 2018-08-29 DIAGNOSIS — N2581 Secondary hyperparathyroidism of renal origin: Secondary | ICD-10-CM | POA: Diagnosis not present

## 2018-08-29 DIAGNOSIS — N186 End stage renal disease: Secondary | ICD-10-CM | POA: Diagnosis not present

## 2018-08-29 DIAGNOSIS — D631 Anemia in chronic kidney disease: Secondary | ICD-10-CM | POA: Diagnosis not present

## 2018-08-29 DIAGNOSIS — R112 Nausea with vomiting, unspecified: Secondary | ICD-10-CM | POA: Diagnosis not present

## 2018-08-29 DIAGNOSIS — Z992 Dependence on renal dialysis: Secondary | ICD-10-CM | POA: Diagnosis not present

## 2018-09-01 DIAGNOSIS — R112 Nausea with vomiting, unspecified: Secondary | ICD-10-CM | POA: Diagnosis not present

## 2018-09-01 DIAGNOSIS — D631 Anemia in chronic kidney disease: Secondary | ICD-10-CM | POA: Diagnosis not present

## 2018-09-01 DIAGNOSIS — Z992 Dependence on renal dialysis: Secondary | ICD-10-CM | POA: Diagnosis not present

## 2018-09-01 DIAGNOSIS — N2581 Secondary hyperparathyroidism of renal origin: Secondary | ICD-10-CM | POA: Diagnosis not present

## 2018-09-01 DIAGNOSIS — D509 Iron deficiency anemia, unspecified: Secondary | ICD-10-CM | POA: Diagnosis not present

## 2018-09-01 DIAGNOSIS — N186 End stage renal disease: Secondary | ICD-10-CM | POA: Diagnosis not present

## 2018-09-02 DIAGNOSIS — N2581 Secondary hyperparathyroidism of renal origin: Secondary | ICD-10-CM | POA: Diagnosis not present

## 2018-09-02 DIAGNOSIS — D631 Anemia in chronic kidney disease: Secondary | ICD-10-CM | POA: Diagnosis not present

## 2018-09-02 DIAGNOSIS — D509 Iron deficiency anemia, unspecified: Secondary | ICD-10-CM | POA: Diagnosis not present

## 2018-09-02 DIAGNOSIS — Z992 Dependence on renal dialysis: Secondary | ICD-10-CM | POA: Diagnosis not present

## 2018-09-02 DIAGNOSIS — N186 End stage renal disease: Secondary | ICD-10-CM | POA: Diagnosis not present

## 2018-09-02 DIAGNOSIS — R112 Nausea with vomiting, unspecified: Secondary | ICD-10-CM | POA: Diagnosis not present

## 2018-09-03 DIAGNOSIS — R112 Nausea with vomiting, unspecified: Secondary | ICD-10-CM | POA: Diagnosis not present

## 2018-09-03 DIAGNOSIS — N186 End stage renal disease: Secondary | ICD-10-CM | POA: Diagnosis not present

## 2018-09-03 DIAGNOSIS — D631 Anemia in chronic kidney disease: Secondary | ICD-10-CM | POA: Diagnosis not present

## 2018-09-03 DIAGNOSIS — D509 Iron deficiency anemia, unspecified: Secondary | ICD-10-CM | POA: Diagnosis not present

## 2018-09-03 DIAGNOSIS — Z992 Dependence on renal dialysis: Secondary | ICD-10-CM | POA: Diagnosis not present

## 2018-09-03 DIAGNOSIS — N2581 Secondary hyperparathyroidism of renal origin: Secondary | ICD-10-CM | POA: Diagnosis not present

## 2018-09-05 DIAGNOSIS — N2581 Secondary hyperparathyroidism of renal origin: Secondary | ICD-10-CM | POA: Diagnosis not present

## 2018-09-05 DIAGNOSIS — R112 Nausea with vomiting, unspecified: Secondary | ICD-10-CM | POA: Diagnosis not present

## 2018-09-05 DIAGNOSIS — D631 Anemia in chronic kidney disease: Secondary | ICD-10-CM | POA: Diagnosis not present

## 2018-09-05 DIAGNOSIS — N186 End stage renal disease: Secondary | ICD-10-CM | POA: Diagnosis not present

## 2018-09-05 DIAGNOSIS — D509 Iron deficiency anemia, unspecified: Secondary | ICD-10-CM | POA: Diagnosis not present

## 2018-09-05 DIAGNOSIS — Z992 Dependence on renal dialysis: Secondary | ICD-10-CM | POA: Diagnosis not present

## 2018-09-08 DIAGNOSIS — N2581 Secondary hyperparathyroidism of renal origin: Secondary | ICD-10-CM | POA: Diagnosis not present

## 2018-09-08 DIAGNOSIS — D509 Iron deficiency anemia, unspecified: Secondary | ICD-10-CM | POA: Diagnosis not present

## 2018-09-08 DIAGNOSIS — N186 End stage renal disease: Secondary | ICD-10-CM | POA: Diagnosis not present

## 2018-09-08 DIAGNOSIS — R112 Nausea with vomiting, unspecified: Secondary | ICD-10-CM | POA: Diagnosis not present

## 2018-09-08 DIAGNOSIS — D631 Anemia in chronic kidney disease: Secondary | ICD-10-CM | POA: Diagnosis not present

## 2018-09-08 DIAGNOSIS — Z992 Dependence on renal dialysis: Secondary | ICD-10-CM | POA: Diagnosis not present

## 2018-09-09 DIAGNOSIS — Z992 Dependence on renal dialysis: Secondary | ICD-10-CM | POA: Diagnosis not present

## 2018-09-09 DIAGNOSIS — T82898A Other specified complication of vascular prosthetic devices, implants and grafts, initial encounter: Secondary | ICD-10-CM | POA: Diagnosis not present

## 2018-09-09 DIAGNOSIS — N186 End stage renal disease: Secondary | ICD-10-CM | POA: Diagnosis not present

## 2018-09-10 DIAGNOSIS — R112 Nausea with vomiting, unspecified: Secondary | ICD-10-CM | POA: Diagnosis not present

## 2018-09-10 DIAGNOSIS — D631 Anemia in chronic kidney disease: Secondary | ICD-10-CM | POA: Diagnosis not present

## 2018-09-10 DIAGNOSIS — N186 End stage renal disease: Secondary | ICD-10-CM | POA: Diagnosis not present

## 2018-09-10 DIAGNOSIS — D509 Iron deficiency anemia, unspecified: Secondary | ICD-10-CM | POA: Diagnosis not present

## 2018-09-10 DIAGNOSIS — Z992 Dependence on renal dialysis: Secondary | ICD-10-CM | POA: Diagnosis not present

## 2018-09-10 DIAGNOSIS — N2581 Secondary hyperparathyroidism of renal origin: Secondary | ICD-10-CM | POA: Diagnosis not present

## 2018-09-12 DIAGNOSIS — N186 End stage renal disease: Secondary | ICD-10-CM | POA: Diagnosis not present

## 2018-09-12 DIAGNOSIS — N2581 Secondary hyperparathyroidism of renal origin: Secondary | ICD-10-CM | POA: Diagnosis not present

## 2018-09-12 DIAGNOSIS — Z992 Dependence on renal dialysis: Secondary | ICD-10-CM | POA: Diagnosis not present

## 2018-09-12 DIAGNOSIS — D631 Anemia in chronic kidney disease: Secondary | ICD-10-CM | POA: Diagnosis not present

## 2018-09-12 DIAGNOSIS — D509 Iron deficiency anemia, unspecified: Secondary | ICD-10-CM | POA: Diagnosis not present

## 2018-09-12 DIAGNOSIS — R112 Nausea with vomiting, unspecified: Secondary | ICD-10-CM | POA: Diagnosis not present

## 2018-09-15 DIAGNOSIS — N2581 Secondary hyperparathyroidism of renal origin: Secondary | ICD-10-CM | POA: Diagnosis not present

## 2018-09-15 DIAGNOSIS — N186 End stage renal disease: Secondary | ICD-10-CM | POA: Diagnosis not present

## 2018-09-15 DIAGNOSIS — R112 Nausea with vomiting, unspecified: Secondary | ICD-10-CM | POA: Diagnosis not present

## 2018-09-15 DIAGNOSIS — Z992 Dependence on renal dialysis: Secondary | ICD-10-CM | POA: Diagnosis not present

## 2018-09-15 DIAGNOSIS — D631 Anemia in chronic kidney disease: Secondary | ICD-10-CM | POA: Diagnosis not present

## 2018-09-15 DIAGNOSIS — D509 Iron deficiency anemia, unspecified: Secondary | ICD-10-CM | POA: Diagnosis not present

## 2018-09-17 DIAGNOSIS — D509 Iron deficiency anemia, unspecified: Secondary | ICD-10-CM | POA: Diagnosis not present

## 2018-09-17 DIAGNOSIS — Z23 Encounter for immunization: Secondary | ICD-10-CM | POA: Diagnosis not present

## 2018-09-17 DIAGNOSIS — D631 Anemia in chronic kidney disease: Secondary | ICD-10-CM | POA: Diagnosis not present

## 2018-09-17 DIAGNOSIS — N2581 Secondary hyperparathyroidism of renal origin: Secondary | ICD-10-CM | POA: Diagnosis not present

## 2018-09-17 DIAGNOSIS — R112 Nausea with vomiting, unspecified: Secondary | ICD-10-CM | POA: Diagnosis not present

## 2018-09-17 DIAGNOSIS — Z992 Dependence on renal dialysis: Secondary | ICD-10-CM | POA: Diagnosis not present

## 2018-09-17 DIAGNOSIS — N186 End stage renal disease: Secondary | ICD-10-CM | POA: Diagnosis not present

## 2018-09-18 DIAGNOSIS — Z01818 Encounter for other preprocedural examination: Secondary | ICD-10-CM | POA: Diagnosis not present

## 2018-09-18 DIAGNOSIS — I10 Essential (primary) hypertension: Secondary | ICD-10-CM | POA: Diagnosis not present

## 2018-09-19 DIAGNOSIS — D509 Iron deficiency anemia, unspecified: Secondary | ICD-10-CM | POA: Diagnosis not present

## 2018-09-19 DIAGNOSIS — N186 End stage renal disease: Secondary | ICD-10-CM | POA: Diagnosis not present

## 2018-09-19 DIAGNOSIS — Z992 Dependence on renal dialysis: Secondary | ICD-10-CM | POA: Diagnosis not present

## 2018-09-19 DIAGNOSIS — R112 Nausea with vomiting, unspecified: Secondary | ICD-10-CM | POA: Diagnosis not present

## 2018-09-19 DIAGNOSIS — Z23 Encounter for immunization: Secondary | ICD-10-CM | POA: Diagnosis not present

## 2018-09-19 DIAGNOSIS — N2581 Secondary hyperparathyroidism of renal origin: Secondary | ICD-10-CM | POA: Diagnosis not present

## 2018-09-22 DIAGNOSIS — Z992 Dependence on renal dialysis: Secondary | ICD-10-CM | POA: Diagnosis not present

## 2018-09-22 DIAGNOSIS — N2581 Secondary hyperparathyroidism of renal origin: Secondary | ICD-10-CM | POA: Diagnosis not present

## 2018-09-22 DIAGNOSIS — Z23 Encounter for immunization: Secondary | ICD-10-CM | POA: Diagnosis not present

## 2018-09-22 DIAGNOSIS — N186 End stage renal disease: Secondary | ICD-10-CM | POA: Diagnosis not present

## 2018-09-22 DIAGNOSIS — D509 Iron deficiency anemia, unspecified: Secondary | ICD-10-CM | POA: Diagnosis not present

## 2018-09-22 DIAGNOSIS — R112 Nausea with vomiting, unspecified: Secondary | ICD-10-CM | POA: Diagnosis not present

## 2018-09-24 DIAGNOSIS — N2581 Secondary hyperparathyroidism of renal origin: Secondary | ICD-10-CM | POA: Diagnosis not present

## 2018-09-24 DIAGNOSIS — R112 Nausea with vomiting, unspecified: Secondary | ICD-10-CM | POA: Diagnosis not present

## 2018-09-24 DIAGNOSIS — Z992 Dependence on renal dialysis: Secondary | ICD-10-CM | POA: Diagnosis not present

## 2018-09-24 DIAGNOSIS — D509 Iron deficiency anemia, unspecified: Secondary | ICD-10-CM | POA: Diagnosis not present

## 2018-09-24 DIAGNOSIS — Z23 Encounter for immunization: Secondary | ICD-10-CM | POA: Diagnosis not present

## 2018-09-24 DIAGNOSIS — N186 End stage renal disease: Secondary | ICD-10-CM | POA: Diagnosis not present

## 2018-09-26 DIAGNOSIS — N186 End stage renal disease: Secondary | ICD-10-CM | POA: Diagnosis not present

## 2018-09-26 DIAGNOSIS — Z992 Dependence on renal dialysis: Secondary | ICD-10-CM | POA: Diagnosis not present

## 2018-09-26 DIAGNOSIS — D509 Iron deficiency anemia, unspecified: Secondary | ICD-10-CM | POA: Diagnosis not present

## 2018-09-26 DIAGNOSIS — Z23 Encounter for immunization: Secondary | ICD-10-CM | POA: Diagnosis not present

## 2018-09-26 DIAGNOSIS — N2581 Secondary hyperparathyroidism of renal origin: Secondary | ICD-10-CM | POA: Diagnosis not present

## 2018-09-26 DIAGNOSIS — R112 Nausea with vomiting, unspecified: Secondary | ICD-10-CM | POA: Diagnosis not present

## 2018-09-29 DIAGNOSIS — Z992 Dependence on renal dialysis: Secondary | ICD-10-CM | POA: Diagnosis not present

## 2018-09-29 DIAGNOSIS — Z23 Encounter for immunization: Secondary | ICD-10-CM | POA: Diagnosis not present

## 2018-09-29 DIAGNOSIS — N186 End stage renal disease: Secondary | ICD-10-CM | POA: Diagnosis not present

## 2018-09-29 DIAGNOSIS — N2581 Secondary hyperparathyroidism of renal origin: Secondary | ICD-10-CM | POA: Diagnosis not present

## 2018-09-29 DIAGNOSIS — D509 Iron deficiency anemia, unspecified: Secondary | ICD-10-CM | POA: Diagnosis not present

## 2018-09-29 DIAGNOSIS — R112 Nausea with vomiting, unspecified: Secondary | ICD-10-CM | POA: Diagnosis not present

## 2018-10-01 DIAGNOSIS — Z23 Encounter for immunization: Secondary | ICD-10-CM | POA: Diagnosis not present

## 2018-10-01 DIAGNOSIS — N2581 Secondary hyperparathyroidism of renal origin: Secondary | ICD-10-CM | POA: Diagnosis not present

## 2018-10-01 DIAGNOSIS — R112 Nausea with vomiting, unspecified: Secondary | ICD-10-CM | POA: Diagnosis not present

## 2018-10-01 DIAGNOSIS — Z992 Dependence on renal dialysis: Secondary | ICD-10-CM | POA: Diagnosis not present

## 2018-10-01 DIAGNOSIS — D509 Iron deficiency anemia, unspecified: Secondary | ICD-10-CM | POA: Diagnosis not present

## 2018-10-01 DIAGNOSIS — N186 End stage renal disease: Secondary | ICD-10-CM | POA: Diagnosis not present

## 2018-10-02 DIAGNOSIS — S9304XA Dislocation of right ankle joint, initial encounter: Secondary | ICD-10-CM | POA: Diagnosis not present

## 2018-10-02 DIAGNOSIS — S82851A Displaced trimalleolar fracture of right lower leg, initial encounter for closed fracture: Secondary | ICD-10-CM | POA: Diagnosis not present

## 2018-10-02 DIAGNOSIS — R609 Edema, unspecified: Secondary | ICD-10-CM | POA: Diagnosis not present

## 2018-10-02 DIAGNOSIS — W19XXXA Unspecified fall, initial encounter: Secondary | ICD-10-CM | POA: Diagnosis not present

## 2018-10-02 DIAGNOSIS — S99911A Unspecified injury of right ankle, initial encounter: Secondary | ICD-10-CM | POA: Diagnosis not present

## 2018-10-02 DIAGNOSIS — Y999 Unspecified external cause status: Secondary | ICD-10-CM | POA: Diagnosis not present

## 2018-10-02 DIAGNOSIS — W010XXA Fall on same level from slipping, tripping and stumbling without subsequent striking against object, initial encounter: Secondary | ICD-10-CM | POA: Diagnosis not present

## 2018-10-02 DIAGNOSIS — Z9181 History of falling: Secondary | ICD-10-CM | POA: Diagnosis not present

## 2018-10-02 DIAGNOSIS — Z888 Allergy status to other drugs, medicaments and biological substances status: Secondary | ICD-10-CM | POA: Diagnosis not present

## 2018-10-02 DIAGNOSIS — E119 Type 2 diabetes mellitus without complications: Secondary | ICD-10-CM | POA: Diagnosis not present

## 2018-10-02 DIAGNOSIS — Y929 Unspecified place or not applicable: Secondary | ICD-10-CM | POA: Diagnosis not present

## 2018-10-02 DIAGNOSIS — N19 Unspecified kidney failure: Secondary | ICD-10-CM | POA: Diagnosis not present

## 2018-10-02 DIAGNOSIS — I10 Essential (primary) hypertension: Secondary | ICD-10-CM | POA: Diagnosis not present

## 2018-10-03 ENCOUNTER — Encounter (INDEPENDENT_AMBULATORY_CARE_PROVIDER_SITE_OTHER): Payer: Self-pay | Admitting: Orthopaedic Surgery

## 2018-10-03 ENCOUNTER — Ambulatory Visit (INDEPENDENT_AMBULATORY_CARE_PROVIDER_SITE_OTHER): Payer: Medicare Other | Admitting: Orthopaedic Surgery

## 2018-10-03 ENCOUNTER — Other Ambulatory Visit (INDEPENDENT_AMBULATORY_CARE_PROVIDER_SITE_OTHER): Payer: Self-pay | Admitting: Orthopaedic Surgery

## 2018-10-03 ENCOUNTER — Encounter (HOSPITAL_COMMUNITY): Payer: Self-pay | Admitting: *Deleted

## 2018-10-03 ENCOUNTER — Ambulatory Visit (INDEPENDENT_AMBULATORY_CARE_PROVIDER_SITE_OTHER): Payer: Medicare Other

## 2018-10-03 ENCOUNTER — Other Ambulatory Visit: Payer: Self-pay

## 2018-10-03 VITALS — BP 133/68 | HR 103 | Ht 63.0 in | Wt 245.0 lb

## 2018-10-03 DIAGNOSIS — S82851A Displaced trimalleolar fracture of right lower leg, initial encounter for closed fracture: Secondary | ICD-10-CM

## 2018-10-03 DIAGNOSIS — M25571 Pain in right ankle and joints of right foot: Secondary | ICD-10-CM

## 2018-10-03 DIAGNOSIS — N186 End stage renal disease: Secondary | ICD-10-CM | POA: Diagnosis not present

## 2018-10-03 DIAGNOSIS — Z23 Encounter for immunization: Secondary | ICD-10-CM | POA: Diagnosis not present

## 2018-10-03 DIAGNOSIS — R112 Nausea with vomiting, unspecified: Secondary | ICD-10-CM | POA: Diagnosis not present

## 2018-10-03 DIAGNOSIS — Z992 Dependence on renal dialysis: Secondary | ICD-10-CM | POA: Diagnosis not present

## 2018-10-03 DIAGNOSIS — D509 Iron deficiency anemia, unspecified: Secondary | ICD-10-CM | POA: Diagnosis not present

## 2018-10-03 DIAGNOSIS — N2581 Secondary hyperparathyroidism of renal origin: Secondary | ICD-10-CM | POA: Diagnosis not present

## 2018-10-03 NOTE — Progress Notes (Signed)
Pt denies SOB, chest pain, and being under the care of a cardiologist. Pt denies having a cardiac cath. Pt made aware to stop taking vitamins, fish oil and herbal medications. Do not take any NSAIDs ie: Ibuprofen, Advil, Naproxen (Aleve), Motrin, BC and Goody Powder. Pt made aware to not take Glipizide the night before and morning of surgery. Pt made aware to check BG every 2 hours prior to arrival to hospital on DOS. Pt made aware to treat a BG < 70 with  4 ounces of apple or cranberry juice, wait 15 minutes after intervention to recheck BG, if BG remains < 70, call Short Stay unit to speak with a nurse. Pt verbalized understanding of all pre-op instructions.
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
## 2018-10-06 ENCOUNTER — Encounter (HOSPITAL_COMMUNITY): Payer: Self-pay

## 2018-10-06 ENCOUNTER — Inpatient Hospital Stay (HOSPITAL_COMMUNITY)
Admission: RE | Admit: 2018-10-06 | Discharge: 2018-10-09 | DRG: 492 | Disposition: A | Discharge status: Home Health | Payer: Medicare Other | Attending: Orthopaedic Surgery | Admitting: Orthopaedic Surgery

## 2018-10-06 ENCOUNTER — Ambulatory Visit (HOSPITAL_COMMUNITY): Payer: Medicare Other | Admitting: Certified Registered Nurse Anesthetist

## 2018-10-06 ENCOUNTER — Ambulatory Visit (HOSPITAL_COMMUNITY): Payer: Medicare Other

## 2018-10-06 ENCOUNTER — Encounter (INDEPENDENT_AMBULATORY_CARE_PROVIDER_SITE_OTHER): Payer: Self-pay | Admitting: Orthopaedic Surgery

## 2018-10-06 ENCOUNTER — Other Ambulatory Visit: Payer: Self-pay

## 2018-10-06 ENCOUNTER — Encounter (HOSPITAL_COMMUNITY)
Admission: RE | Disposition: A | Discharge status: Home Health | Payer: Self-pay | Source: Home / Self Care | Attending: Orthopaedic Surgery

## 2018-10-06 DIAGNOSIS — E1122 Type 2 diabetes mellitus with diabetic chronic kidney disease: Secondary | ICD-10-CM | POA: Diagnosis present

## 2018-10-06 DIAGNOSIS — I120 Hypertensive chronic kidney disease with stage 5 chronic kidney disease or end stage renal disease: Secondary | ICD-10-CM | POA: Diagnosis not present

## 2018-10-06 DIAGNOSIS — K219 Gastro-esophageal reflux disease without esophagitis: Secondary | ICD-10-CM | POA: Diagnosis present

## 2018-10-06 DIAGNOSIS — G8918 Other acute postprocedural pain: Secondary | ICD-10-CM | POA: Diagnosis not present

## 2018-10-06 DIAGNOSIS — Z01818 Encounter for other preprocedural examination: Secondary | ICD-10-CM | POA: Diagnosis not present

## 2018-10-06 DIAGNOSIS — R112 Nausea with vomiting, unspecified: Secondary | ICD-10-CM | POA: Diagnosis not present

## 2018-10-06 DIAGNOSIS — Z6841 Body Mass Index (BMI) 40.0 and over, adult: Secondary | ICD-10-CM

## 2018-10-06 DIAGNOSIS — G4733 Obstructive sleep apnea (adult) (pediatric): Secondary | ICD-10-CM | POA: Diagnosis present

## 2018-10-06 DIAGNOSIS — I5032 Chronic diastolic (congestive) heart failure: Secondary | ICD-10-CM | POA: Diagnosis present

## 2018-10-06 DIAGNOSIS — E8889 Other specified metabolic disorders: Secondary | ICD-10-CM | POA: Diagnosis present

## 2018-10-06 DIAGNOSIS — I132 Hypertensive heart and chronic kidney disease with heart failure and with stage 5 chronic kidney disease, or end stage renal disease: Secondary | ICD-10-CM | POA: Diagnosis present

## 2018-10-06 DIAGNOSIS — Z8249 Family history of ischemic heart disease and other diseases of the circulatory system: Secondary | ICD-10-CM | POA: Diagnosis not present

## 2018-10-06 DIAGNOSIS — M79671 Pain in right foot: Secondary | ICD-10-CM | POA: Diagnosis not present

## 2018-10-06 DIAGNOSIS — D638 Anemia in other chronic diseases classified elsewhere: Secondary | ICD-10-CM | POA: Diagnosis present

## 2018-10-06 DIAGNOSIS — Y9289 Other specified places as the place of occurrence of the external cause: Secondary | ICD-10-CM | POA: Diagnosis not present

## 2018-10-06 DIAGNOSIS — S82891A Other fracture of right lower leg, initial encounter for closed fracture: Secondary | ICD-10-CM | POA: Diagnosis present

## 2018-10-06 DIAGNOSIS — Z7984 Long term (current) use of oral hypoglycemic drugs: Secondary | ICD-10-CM | POA: Diagnosis not present

## 2018-10-06 DIAGNOSIS — Z992 Dependence on renal dialysis: Secondary | ICD-10-CM | POA: Diagnosis not present

## 2018-10-06 DIAGNOSIS — N186 End stage renal disease: Secondary | ICD-10-CM | POA: Diagnosis present

## 2018-10-06 DIAGNOSIS — S82871A Displaced pilon fracture of right tibia, initial encounter for closed fracture: Secondary | ICD-10-CM | POA: Diagnosis not present

## 2018-10-06 DIAGNOSIS — N2581 Secondary hyperparathyroidism of renal origin: Secondary | ICD-10-CM | POA: Diagnosis present

## 2018-10-06 DIAGNOSIS — D509 Iron deficiency anemia, unspecified: Secondary | ICD-10-CM | POA: Diagnosis not present

## 2018-10-06 DIAGNOSIS — K5900 Constipation, unspecified: Secondary | ICD-10-CM | POA: Diagnosis not present

## 2018-10-06 DIAGNOSIS — S82851A Displaced trimalleolar fracture of right lower leg, initial encounter for closed fracture: Secondary | ICD-10-CM | POA: Diagnosis present

## 2018-10-06 DIAGNOSIS — S82853A Displaced trimalleolar fracture of unspecified lower leg, initial encounter for closed fracture: Secondary | ICD-10-CM

## 2018-10-06 DIAGNOSIS — Z23 Encounter for immunization: Secondary | ICD-10-CM | POA: Diagnosis not present

## 2018-10-06 DIAGNOSIS — Z853 Personal history of malignant neoplasm of breast: Secondary | ICD-10-CM | POA: Diagnosis not present

## 2018-10-06 DIAGNOSIS — Z833 Family history of diabetes mellitus: Secondary | ICD-10-CM

## 2018-10-06 DIAGNOSIS — Z419 Encounter for procedure for purposes other than remedying health state, unspecified: Secondary | ICD-10-CM

## 2018-10-06 DIAGNOSIS — S82891D Other fracture of right lower leg, subsequent encounter for closed fracture with routine healing: Secondary | ICD-10-CM | POA: Diagnosis not present

## 2018-10-06 DIAGNOSIS — D631 Anemia in chronic kidney disease: Secondary | ICD-10-CM | POA: Diagnosis not present

## 2018-10-06 HISTORY — DX: Gastro-esophageal reflux disease without esophagitis: K21.9

## 2018-10-06 HISTORY — DX: Presence of spectacles and contact lenses: Z97.3

## 2018-10-06 HISTORY — DX: Other specified postprocedural states: Z98.890

## 2018-10-06 HISTORY — DX: Anemia, unspecified: D64.9

## 2018-10-06 HISTORY — PX: ORIF ANKLE FRACTURE: SHX5408

## 2018-10-06 HISTORY — DX: Other fracture of unspecified lower leg, initial encounter for closed fracture: S82.899A

## 2018-10-06 HISTORY — DX: Nausea with vomiting, unspecified: R11.2

## 2018-10-06 LAB — CBC
HCT: 31.4 % — ABNORMAL LOW (ref 36.0–46.0)
Hemoglobin: 9.3 g/dL — ABNORMAL LOW (ref 12.0–15.0)
MCH: 27.3 pg (ref 26.0–34.0)
MCHC: 29.6 g/dL — ABNORMAL LOW (ref 30.0–36.0)
MCV: 92.1 fL (ref 80.0–100.0)
NRBC: 0 % (ref 0.0–0.2)
PLATELETS: 248 10*3/uL (ref 150–400)
RBC: 3.41 MIL/uL — AB (ref 3.87–5.11)
RDW: 14 % (ref 11.5–15.5)
WBC: 12.6 10*3/uL — AB (ref 4.0–10.5)

## 2018-10-06 LAB — BASIC METABOLIC PANEL
ANION GAP: 17 — AB (ref 5–15)
BUN: 26 mg/dL — ABNORMAL HIGH (ref 6–20)
CALCIUM: 9 mg/dL (ref 8.9–10.3)
CHLORIDE: 92 mmol/L — AB (ref 98–111)
CO2: 25 mmol/L (ref 22–32)
Creatinine, Ser: 7.61 mg/dL — ABNORMAL HIGH (ref 0.44–1.00)
GFR calc non Af Amer: 6 mL/min — ABNORMAL LOW (ref >60)
GFR, EST AFRICAN AMERICAN: 7 mL/min — AB (ref >60)
Glucose, Bld: 182 mg/dL — ABNORMAL HIGH (ref 70–99)
Potassium: 3.6 mmol/L (ref 3.5–5.1)
SODIUM: 134 mmol/L — AB (ref 135–145)

## 2018-10-06 LAB — GLUCOSE, CAPILLARY
Glucose-Capillary: 181 mg/dL — ABNORMAL HIGH (ref 70–99)
Glucose-Capillary: 110 mg/dL — ABNORMAL HIGH (ref 70–99)

## 2018-10-06 SURGERY — OPEN REDUCTION INTERNAL FIXATION (ORIF) ANKLE FRACTURE
Anesthesia: Regional | Site: Ankle | Laterality: Right

## 2018-10-06 MED ORDER — BUPIVACAINE-EPINEPHRINE (PF) 0.25% -1:200000 IJ SOLN
INTRAMUSCULAR | Status: AC
  Filled 2018-10-06: qty 30

## 2018-10-06 MED ORDER — FENTANYL CITRATE (PF) 250 MCG/5ML IJ SOLN
INTRAMUSCULAR | Status: DC | PRN
  Administered 2018-10-06 (×3): 50 ug via INTRAVENOUS

## 2018-10-06 MED ORDER — SCOPOLAMINE 1 MG/3DAYS TD PT72
MEDICATED_PATCH | TRANSDERMAL | Status: DC | PRN
  Administered 2018-10-06: 1 via TRANSDERMAL

## 2018-10-06 MED ORDER — CHLORHEXIDINE GLUCONATE 4 % EX LIQD: 60.0000 mL | Freq: Once | CUTANEOUS | Status: DC

## 2018-10-06 MED ORDER — SODIUM CHLORIDE 0.9 % IV SOLN
INTRAVENOUS | Status: DC | PRN
  Administered 2018-10-06: 35 ug/min via INTRAVENOUS

## 2018-10-06 MED ORDER — HYDROMORPHONE HCL 1 MG/ML IJ SOLN: 0.2500 mg | INTRAMUSCULAR | Status: DC | PRN

## 2018-10-06 MED ORDER — LIDOCAINE 2% (20 MG/ML) 5 ML SYRINGE
INTRAMUSCULAR | Status: DC | PRN
  Administered 2018-10-06: 60 mg via INTRAVENOUS

## 2018-10-06 MED ORDER — LIDOCAINE 2% (20 MG/ML) 5 ML SYRINGE
INTRAMUSCULAR | Status: AC
  Filled 2018-10-06: qty 5

## 2018-10-06 MED ORDER — PHENYLEPHRINE HCL 10 MG/ML IJ SOLN
INTRAMUSCULAR | Status: DC | PRN
  Administered 2018-10-06: 200 ug via INTRAVENOUS
  Administered 2018-10-06 (×2): 120 ug via INTRAVENOUS

## 2018-10-06 MED ORDER — MIDAZOLAM HCL 2 MG/2ML IJ SOLN
INTRAMUSCULAR | Status: AC
  Administered 2018-10-06: 2 mg via INTRAVENOUS
  Filled 2018-10-06: qty 2

## 2018-10-06 MED ORDER — ROPIVACAINE HCL 5 MG/ML IJ SOLN
INTRAMUSCULAR | Status: DC | PRN
  Administered 2018-10-06: 50 mL via PERINEURAL

## 2018-10-06 MED ORDER — ONDANSETRON HCL 4 MG/2ML IJ SOLN
INTRAMUSCULAR | Status: AC
  Filled 2018-10-06: qty 2

## 2018-10-06 MED ORDER — SODIUM CHLORIDE 0.9 % IV SOLN
INTRAVENOUS | Status: DC
  Administered 2018-10-06: 13:00:00 via INTRAVENOUS

## 2018-10-06 MED ORDER — CEFAZOLIN SODIUM-DEXTROSE 2-4 GM/100ML-% IV SOLN
2.0000 g | INTRAVENOUS | Status: AC
  Administered 2018-10-06: 2 g via INTRAVENOUS

## 2018-10-06 MED ORDER — HYDROMORPHONE HCL 1 MG/ML IJ SOLN
0.5000 mg | INTRAMUSCULAR | Status: DC | PRN
  Administered 2018-10-07 – 2018-10-08 (×6): 0.5 mg via INTRAVENOUS
  Filled 2018-10-06 (×4): qty 1

## 2018-10-06 MED ORDER — OXYCODONE HCL 5 MG PO TABS: 5.0000 mg | ORAL_TABLET | Freq: Once | ORAL | Status: DC | PRN

## 2018-10-06 MED ORDER — OXYCODONE HCL 5 MG/5ML PO SOLN: 5.0000 mg | Freq: Once | ORAL | Status: DC | PRN

## 2018-10-06 MED ORDER — CEFAZOLIN SODIUM-DEXTROSE 1-4 GM/50ML-% IV SOLN
1.0000 g | INTRAVENOUS | Status: AC
  Administered 2018-10-07: 1 g via INTRAVENOUS
  Filled 2018-10-06 (×3): qty 50

## 2018-10-06 MED ORDER — FENTANYL CITRATE (PF) 250 MCG/5ML IJ SOLN
INTRAMUSCULAR | Status: AC
  Filled 2018-10-06: qty 5

## 2018-10-06 MED ORDER — FENTANYL CITRATE (PF) 100 MCG/2ML IJ SOLN
50.0000 ug | Freq: Once | INTRAMUSCULAR | Status: AC
  Administered 2018-10-06: 50 ug via INTRAVENOUS
  Filled 2018-10-06: qty 1

## 2018-10-06 MED ORDER — ONDANSETRON HCL 4 MG/2ML IJ SOLN
INTRAMUSCULAR | Status: DC | PRN
  Administered 2018-10-06: 4 mg via INTRAVENOUS

## 2018-10-06 MED ORDER — OXYCODONE HCL 5 MG PO TABS
5.0000 mg | ORAL_TABLET | Freq: Four times a day (QID) | ORAL | Status: DC | PRN
  Administered 2018-10-06 – 2018-10-08 (×5): 10 mg via ORAL
  Filled 2018-10-06 (×4): qty 2
  Filled 2018-10-06 (×2): qty 1

## 2018-10-06 MED ORDER — PROPOFOL 10 MG/ML IV BOLUS
INTRAVENOUS | Status: DC | PRN
  Administered 2018-10-06: 150 mg via INTRAVENOUS
  Administered 2018-10-06: 50 mg via INTRAVENOUS

## 2018-10-06 MED ORDER — CEFAZOLIN SODIUM-DEXTROSE 2-4 GM/100ML-% IV SOLN
INTRAVENOUS | Status: AC
  Filled 2018-10-06: qty 100

## 2018-10-06 MED ORDER — DOCUSATE SODIUM 100 MG PO CAPS
100.0000 mg | ORAL_CAPSULE | Freq: Two times a day (BID) | ORAL | Status: DC
  Administered 2018-10-06 – 2018-10-09 (×4): 100 mg via ORAL
  Filled 2018-10-06 (×4): qty 1

## 2018-10-06 MED ORDER — ONDANSETRON HCL 4 MG/2ML IJ SOLN
4.0000 mg | Freq: Four times a day (QID) | INTRAMUSCULAR | Status: DC | PRN
  Administered 2018-10-08: 4 mg via INTRAVENOUS
  Filled 2018-10-06: qty 2

## 2018-10-06 MED ORDER — FENTANYL CITRATE (PF) 100 MCG/2ML IJ SOLN
INTRAMUSCULAR | Status: AC
  Administered 2018-10-06: 50 ug via INTRAVENOUS
  Filled 2018-10-06: qty 2

## 2018-10-06 MED ORDER — ONDANSETRON HCL 4 MG PO TABS: 4.0000 mg | ORAL_TABLET | Freq: Four times a day (QID) | ORAL | Status: DC | PRN

## 2018-10-06 MED ORDER — PROMETHAZINE HCL 25 MG/ML IJ SOLN: 6.2500 mg | INTRAMUSCULAR | Status: DC | PRN

## 2018-10-06 MED ORDER — DEXAMETHASONE SODIUM PHOSPHATE 10 MG/ML IJ SOLN
INTRAMUSCULAR | Status: DC | PRN
  Administered 2018-10-06: 5 mg via INTRAVENOUS

## 2018-10-06 MED ORDER — ACETAMINOPHEN 500 MG PO TABS
1000.0000 mg | ORAL_TABLET | Freq: Three times a day (TID) | ORAL | Status: DC
  Administered 2018-10-06 – 2018-10-08 (×4): 1000 mg via ORAL
  Filled 2018-10-06 (×5): qty 2

## 2018-10-06 MED ORDER — MIDAZOLAM HCL 2 MG/2ML IJ SOLN
2.0000 mg | Freq: Once | INTRAMUSCULAR | Status: AC
  Administered 2018-10-06: 2 mg via INTRAVENOUS
  Filled 2018-10-06: qty 2

## 2018-10-06 MED ORDER — PHENOL 1.4 % MT LIQD
1.0000 | OROMUCOSAL | Status: DC | PRN
  Administered 2018-10-06: 1 via OROMUCOSAL
  Filled 2018-10-06: qty 177

## 2018-10-06 MED ORDER — EPHEDRINE SULFATE 50 MG/ML IJ SOLN
INTRAMUSCULAR | Status: DC | PRN
  Administered 2018-10-06: 5 mg via INTRAVENOUS

## 2018-10-06 SURGICAL SUPPLY — 64 items
BANDAGE ACE 4X5 VEL STRL LF (GAUZE/BANDAGES/DRESSINGS) IMPLANT
BANDAGE ACE 6X5 VEL STRL LF (GAUZE/BANDAGES/DRESSINGS) IMPLANT
BANDAGE ELASTIC 4 VELCRO ST LF (GAUZE/BANDAGES/DRESSINGS) ×2 IMPLANT
BANDAGE ELASTIC 6 VELCRO ST LF (GAUZE/BANDAGES/DRESSINGS) ×2 IMPLANT
BANDAGE ESMARK 6X9 LF (GAUZE/BANDAGES/DRESSINGS) ×1 IMPLANT
BAR GLASS FIBER EXFX 11X350 (EXFIX) ×4 IMPLANT
BIT DRILL 2.5X2.75 QC CALB (BIT) ×2 IMPLANT
BIT DRILL 2.9 CANN QC NONSTRL (BIT) ×2 IMPLANT
BNDG ESMARK 6X9 LF (GAUZE/BANDAGES/DRESSINGS) ×2
CLAMP BLUE BAR TO PIN (EXFIX) ×4 IMPLANT
COVER MAYO STAND STRL (DRAPES) ×2 IMPLANT
COVER SURGICAL LIGHT HANDLE (MISCELLANEOUS) ×2 IMPLANT
CUFF TOURNIQUET SINGLE 44IN (TOURNIQUET CUFF) ×2 IMPLANT
DRAPE C-ARM 42X72 X-RAY (DRAPES) ×2 IMPLANT
DRAPE HALF SHEET 40X57 (DRAPES) ×2 IMPLANT
DRAPE INCISE IOBAN 66X45 STRL (DRAPES) ×2 IMPLANT
DRAPE U-SHAPE 47X51 STRL (DRAPES) ×2 IMPLANT
DRSG PAD ABDOMINAL 8X10 ST (GAUZE/BANDAGES/DRESSINGS) ×2 IMPLANT
DURAPREP 26ML APPLICATOR (WOUND CARE) ×2 IMPLANT
ELECT REM PT RETURN 9FT ADLT (ELECTROSURGICAL) ×2
ELECTRODE REM PT RTRN 9FT ADLT (ELECTROSURGICAL) ×1 IMPLANT
GAUZE SPONGE 4X4 12PLY STRL (GAUZE/BANDAGES/DRESSINGS) ×2 IMPLANT
GAUZE XEROFORM 5X9 LF (GAUZE/BANDAGES/DRESSINGS) ×2 IMPLANT
GLOVE BIOGEL PI IND STRL 8 (GLOVE) ×2 IMPLANT
GLOVE BIOGEL PI INDICATOR 8 (GLOVE) ×2
GLOVE ORTHO TXT STRL SZ7.5 (GLOVE) ×4 IMPLANT
GOWN STRL REUS W/ TWL LRG LVL3 (GOWN DISPOSABLE) ×1 IMPLANT
GOWN STRL REUS W/ TWL XL LVL3 (GOWN DISPOSABLE) ×2 IMPLANT
GOWN STRL REUS W/TWL 2XL LVL3 (GOWN DISPOSABLE) ×2 IMPLANT
GOWN STRL REUS W/TWL LRG LVL3 (GOWN DISPOSABLE) ×1
GOWN STRL REUS W/TWL XL LVL3 (GOWN DISPOSABLE) ×2
K-WIRE ACE 1.6X6 (WIRE) ×6
KIT BASIN OR (CUSTOM PROCEDURE TRAY) ×2 IMPLANT
KIT TURNOVER KIT B (KITS) ×2 IMPLANT
KWIRE ACE 1.6X6 (WIRE) ×3 IMPLANT
MANIFOLD NEPTUNE II (INSTRUMENTS) ×2 IMPLANT
NS IRRIG 1000ML POUR BTL (IV SOLUTION) ×2 IMPLANT
PACK ORTHO EXTREMITY (CUSTOM PROCEDURE TRAY) ×2 IMPLANT
PAD ARMBOARD 7.5X6 YLW CONV (MISCELLANEOUS) ×4 IMPLANT
PAD CAST 4YDX4 CTTN HI CHSV (CAST SUPPLIES) ×1 IMPLANT
PADDING CAST COTTON 4X4 STRL (CAST SUPPLIES) ×1
PADDING CAST COTTON 6X4 STRL (CAST SUPPLIES) ×2 IMPLANT
PIN CLAMP 2BAR 75MM BLUE (EXFIX) ×2 IMPLANT
PIN HALF YELLOW 5X160X35 (EXFIX) ×4 IMPLANT
PIN TRANSFIXING 5.0 (EXFIX) ×2 IMPLANT
PLATE LOCK 4H 109 RT DIST FIB (Plate) ×2 IMPLANT
SCREW ACE CAN 4.0 40M (Screw) ×2 IMPLANT
SCREW ACE CAN 4.0 46M (Screw) ×2 IMPLANT
SCREW LOCK 3.5X10 DIST TIB (Screw) ×2 IMPLANT
SCREW LOCK CORT STAR 3.5X10 (Screw) ×4 IMPLANT
SCREW LOCK CORT STAR 3.5X12 (Screw) ×8 IMPLANT
SCREW NON LOCKING LP 3.5 14MM (Screw) ×6 IMPLANT
SCREW NON LOCKING LP 3.5 16MM (Screw) ×4 IMPLANT
SPONGE LAP 18X18 X RAY DECT (DISPOSABLE) ×2 IMPLANT
STAPLER VISISTAT 35W (STAPLE) IMPLANT
SUCTION FRAZIER HANDLE 10FR (MISCELLANEOUS) ×1
SUCTION TUBE FRAZIER 10FR DISP (MISCELLANEOUS) ×1 IMPLANT
SUT ETHILON 3 0 PS 1 (SUTURE) ×4 IMPLANT
SUT VIC AB 2-0 CT1 27 (SUTURE) ×2
SUT VIC AB 2-0 CT1 TAPERPNT 27 (SUTURE) ×2 IMPLANT
SYR CONTROL 10ML LL (SYRINGE) ×2 IMPLANT
TOWEL OR 17X26 10 PK STRL BLUE (TOWEL DISPOSABLE) ×2 IMPLANT
TUBE CONNECTING 12X1/4 (SUCTIONS) ×2 IMPLANT
YANKAUER SUCT BULB TIP NO VENT (SUCTIONS) ×2 IMPLANT

## 2018-10-06 NOTE — Progress Notes (Signed)
Pt does not take betablocker on dialysis days. Completed dialysis this morning. Labetalol not given, per Dr. Sabra Heck.

## 2018-10-06 NOTE — Anesthesia Preprocedure Evaluation (Addendum)
Anesthesia Evaluation  Patient identified by MRN, date of birth, ID band Patient awake    Reviewed: Allergy & Precautions, NPO status , Patient's Chart, lab work & pertinent test results  History of Anesthesia Complications (+) PONV  Airway Mallampati: III  TM Distance: >3 FB     Dental  (+) Teeth Intact   Pulmonary sleep apnea , pneumonia, resolved,    breath sounds clear to auscultation       Cardiovascular hypertension, Pt. on medications  Rhythm:Regular Rate:Normal     Neuro/Psych    GI/Hepatic GERD  ,  Endo/Other  diabetes, Type 2Morbid obesity  Renal/GU ESRF and DialysisRenal disease     Musculoskeletal   Abdominal (+) + obese,   Peds  Hematology   Anesthesia Other Findings   Reproductive/Obstetrics                             Anesthesia Physical  Anesthesia Plan  ASA: III  Anesthesia Plan: General   Post-op Pain Management:  Regional for Post-op pain   Induction: Intravenous  PONV Risk Score and Plan: 4 or greater and Ondansetron, Dexamethasone, Midazolam and Scopolamine patch - Pre-op  Airway Management Planned: Oral ETT and LMA  Additional Equipment:   Intra-op Plan:   Post-operative Plan: Extubation in OR  Informed Consent: I have reviewed the patients History and Physical, chart, labs and discussed the procedure including the risks, benefits and alternatives for the proposed anesthesia with the patient or authorized representative who has indicated his/her understanding and acceptance.   Dental advisory given  Plan Discussed with:   Anesthesia Plan Comments:        Anesthesia Quick Evaluation

## 2018-10-06 NOTE — Anesthesia Procedure Notes (Signed)
Anesthesia Regional Block: Popliteal block   Pre-Anesthetic Checklist: ,, timeout performed, Correct Patient, Correct Site, Correct Laterality, Correct Procedure, Correct Position, site marked, Risks and benefits discussed,  Surgical consent,  Pre-op evaluation,  At surgeon's request and post-op pain management  Laterality: Right  Prep: chloraprep       Needles:  Injection technique: Single-shot  Needle Type: Stimiplex     Needle Length: 9cm  Needle Gauge: 21     Additional Needles:   Procedures:,,,, ultrasound used (permanent image in chart),,,,  Narrative:  Start time: 10/06/2018 2:05 PM End time: 10/06/2018 2:10 PM Injection made incrementally with aspirations every 5 mL.  Performed by: Personally  Anesthesiologist: Lynda Rainwater, MD

## 2018-10-06 NOTE — Anesthesia Procedure Notes (Signed)
Procedure Name: LMA Insertion Date/Time: 10/06/2018 2:52 PM Performed by: Bryson Corona, CRNA Pre-anesthesia Checklist: Patient identified, Emergency Drugs available, Suction available and Patient being monitored Patient Re-evaluated:Patient Re-evaluated prior to induction Oxygen Delivery Method: Circle System Utilized Preoxygenation: Pre-oxygenation with 100% oxygen Induction Type: IV induction Ventilation: Mask ventilation without difficulty LMA: LMA inserted LMA Size: 4.0 Number of attempts: 1 Placement Confirmation: positive ETCO2 Tube secured with: Tape Dental Injury: Teeth and Oropharynx as per pre-operative assessment
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
## 2018-10-06 NOTE — Transfer of Care (Signed)
Immediate Anesthesia Transfer of Care Note  Patient: Patricia [name]  Procedure(s) Performed: OPEN REDUCTION INTERNAL FIXATION (ORIF) RIGHT ANKLE TRIMALLEOLAR (Right Ankle)  Patient Location: PACU  Anesthesia Type:General  Level of Consciousness: drowsy and patient cooperative  Airway & Oxygen Therapy: Patient Spontanous Breathing and Patient connected to face mask oxygen  Post-op Assessment: Report given to RN and Post -op Vital signs reviewed and stable  Post vital signs: Reviewed and stable  Last Vitals:  Vitals Value Taken Time  BP 115/51 10/06/2018  4:33 PM  Temp 36.6 C 10/06/2018  4:31 PM  Pulse 87 10/06/2018  4:33 PM  Resp 21 10/06/2018  4:33 PM  SpO2 96 % 10/06/2018  4:33 PM  Vitals shown include unvalidated device data.  Last Pain:  Vitals:   10/06/18 1258  TempSrc:   PainSc: 10-Worst pain ever      Patients Stated Pain Goal: 3 (02/58/52 7782)  Complications: No apparent anesthesia complications

## 2018-10-07 ENCOUNTER — Encounter (HOSPITAL_COMMUNITY): Payer: Self-pay | Admitting: Orthopaedic Surgery

## 2018-10-07 LAB — CBC
HCT: 29.7 % — ABNORMAL LOW (ref 36.0–46.0)
Hemoglobin: 9 g/dL — ABNORMAL LOW (ref 12.0–15.0)
MCH: 27.5 pg (ref 26.0–34.0)
MCHC: 30.3 g/dL (ref 30.0–36.0)
MCV: 90.8 fL (ref 80.0–100.0)
Platelets: 233 10*3/uL (ref 150–400)
RBC: 3.27 MIL/uL — ABNORMAL LOW (ref 3.87–5.11)
RDW: 13.8 % (ref 11.5–15.5)
WBC: 11.3 10*3/uL — ABNORMAL HIGH (ref 4.0–10.5)
nRBC: 0 % (ref 0.0–0.2)

## 2018-10-07 LAB — GLUCOSE, CAPILLARY
GLUCOSE-CAPILLARY: 292 mg/dL — AB (ref 70–99)
GLUCOSE-CAPILLARY: 328 mg/dL — AB (ref 70–99)
Glucose-Capillary: 163 mg/dL — ABNORMAL HIGH (ref 70–99)
Glucose-Capillary: 117 mg/dL — ABNORMAL HIGH (ref 70–99)

## 2018-10-07 LAB — RENAL FUNCTION PANEL
Albumin: 3.5 g/dL (ref 3.5–5.0)
Anion gap: 15 (ref 5–15)
BUN: 40 mg/dL — ABNORMAL HIGH (ref 6–20)
CO2: 25 mmol/L (ref 22–32)
Calcium: 8.9 mg/dL (ref 8.9–10.3)
Chloride: 95 mmol/L — ABNORMAL LOW (ref 98–111)
Creatinine, Ser: 8.2 mg/dL — ABNORMAL HIGH (ref 0.44–1.00)
GFR calc Af Amer: 6 mL/min — ABNORMAL LOW (ref >60)
GFR calc non Af Amer: 5 mL/min — ABNORMAL LOW (ref >60)
Glucose, Bld: 139 mg/dL — ABNORMAL HIGH (ref 70–99)
Phosphorus: 5.3 mg/dL — ABNORMAL HIGH (ref 2.5–4.6)
Potassium: 3.6 mmol/L (ref 3.5–5.1)
Sodium: 135 mmol/L (ref 135–145)

## 2018-10-07 LAB — HEMOGLOBIN A1C
Hgb A1c MFr Bld: 7.4 % — ABNORMAL HIGH (ref 4.8–5.6)
Mean Plasma Glucose: 166 mg/dL

## 2018-10-07 LAB — COMPREHENSIVE METABOLIC PANEL
ALK PHOS: 92 U/L (ref 38–126)
ALT: 14 U/L (ref 0–44)
AST: 15 U/L (ref 15–41)
Albumin: 3.4 g/dL — ABNORMAL LOW (ref 3.5–5.0)
Anion gap: 17 — ABNORMAL HIGH (ref 5–15)
BUN: 48 mg/dL — ABNORMAL HIGH (ref 6–20)
CALCIUM: 9 mg/dL (ref 8.9–10.3)
CO2: 26 mmol/L (ref 22–32)
CREATININE: 10.35 mg/dL — AB (ref 0.44–1.00)
Chloride: 92 mmol/L — ABNORMAL LOW (ref 98–111)
GFR calc non Af Amer: 4 mL/min — ABNORMAL LOW (ref >60)
GFR, EST AFRICAN AMERICAN: 5 mL/min — AB (ref >60)
GLUCOSE: 180 mg/dL — AB (ref 70–99)
Potassium: 4.5 mmol/L (ref 3.5–5.1)
SODIUM: 135 mmol/L (ref 135–145)
Total Bilirubin: 0.6 mg/dL (ref 0.3–1.2)
Total Protein: 6.7 g/dL (ref 6.5–8.1)

## 2018-10-07 LAB — HEPATITIS B SURFACE ANTIGEN: Hepatitis B Surface Ag: NEGATIVE

## 2018-10-07 MED ORDER — PENTAFLUOROPROP-TETRAFLUOROETH EX AERO: 1.0000 "application " | INHALATION_SPRAY | CUTANEOUS | Status: DC | PRN

## 2018-10-07 MED ORDER — HYDROMORPHONE HCL 1 MG/ML IJ SOLN
INTRAMUSCULAR | Status: AC
  Filled 2018-10-07: qty 0.5

## 2018-10-07 MED ORDER — DARBEPOETIN ALFA 60 MCG/0.3ML IJ SOSY
PREFILLED_SYRINGE | INTRAMUSCULAR | Status: AC
  Administered 2018-10-07: 60 ug via INTRAVENOUS
  Filled 2018-10-07: qty 0.3

## 2018-10-07 MED ORDER — GLIPIZIDE 5 MG PO TABS
5.0000 mg | ORAL_TABLET | Freq: Two times a day (BID) | ORAL | Status: DC
  Administered 2018-10-07 – 2018-10-09 (×3): 5 mg via ORAL
  Filled 2018-10-07 (×3): qty 1

## 2018-10-07 MED ORDER — SODIUM CHLORIDE 0.9 % IV SOLN: 100.0000 mL | INTRAVENOUS | Status: DC | PRN

## 2018-10-07 MED ORDER — LIDOCAINE HCL (PF) 1 % IJ SOLN: 5.0000 mL | INTRAMUSCULAR | Status: DC | PRN

## 2018-10-07 MED ORDER — FERRIC CITRATE 1 GM 210 MG(FE) PO TABS
210.0000 mg | ORAL_TABLET | Freq: Two times a day (BID) | ORAL | Status: DC
  Administered 2018-10-07 – 2018-10-09 (×3): 210 mg via ORAL
  Filled 2018-10-07 (×5): qty 1

## 2018-10-07 MED ORDER — INSULIN ASPART 100 UNIT/ML ~~LOC~~ SOLN
0.0000 [IU] | Freq: Three times a day (TID) | SUBCUTANEOUS | Status: DC
  Administered 2018-10-07: 7 [IU] via SUBCUTANEOUS
  Administered 2018-10-08 – 2018-10-09 (×3): 2 [IU] via SUBCUTANEOUS
  Administered 2018-10-09: 3 [IU] via SUBCUTANEOUS

## 2018-10-07 MED ORDER — LABETALOL HCL 100 MG PO TABS
100.0000 mg | ORAL_TABLET | Freq: Two times a day (BID) | ORAL | Status: DC
  Administered 2018-10-07 – 2018-10-09 (×3): 100 mg via ORAL
  Filled 2018-10-07 (×5): qty 1

## 2018-10-07 MED ORDER — CINACALCET HCL 30 MG PO TABS
30.0000 mg | ORAL_TABLET | Freq: Every day | ORAL | Status: DC
  Administered 2018-10-07 – 2018-10-08 (×2): 30 mg via ORAL
  Filled 2018-10-07 (×2): qty 1

## 2018-10-07 MED ORDER — NEPRO/CARBSTEADY PO LIQD
237.0000 mL | Freq: Two times a day (BID) | ORAL | Status: DC
  Administered 2018-10-08 – 2018-10-09 (×2): 237 mL via ORAL
  Filled 2018-10-07 (×4): qty 237

## 2018-10-07 MED ORDER — CHLORHEXIDINE GLUCONATE CLOTH 2 % EX PADS: 6.0000 | MEDICATED_PAD | Freq: Every day | CUTANEOUS | Status: DC

## 2018-10-07 MED ORDER — DARBEPOETIN ALFA 60 MCG/0.3ML IJ SOSY
60.0000 ug | PREFILLED_SYRINGE | INTRAMUSCULAR | Status: DC
  Administered 2018-10-07: 60 ug via INTRAVENOUS

## 2018-10-07 MED ORDER — NIFEDIPINE ER OSMOTIC RELEASE 60 MG PO TB24
60.0000 mg | ORAL_TABLET | Freq: Every day | ORAL | Status: DC
  Administered 2018-10-09: 60 mg via ORAL
  Filled 2018-10-07 (×3): qty 1

## 2018-10-07 MED ORDER — DOXERCALCIFEROL 4 MCG/2ML IV SOLN
7.0000 ug | INTRAVENOUS | Status: DC
  Administered 2018-10-08 (×2): 7 ug via INTRAVENOUS
  Filled 2018-10-07: qty 4

## 2018-10-07 MED ORDER — RENA-VITE PO TABS
1.0000 | ORAL_TABLET | Freq: Every day | ORAL | Status: DC
  Administered 2018-10-07 – 2018-10-08 (×2): 1 via ORAL
  Filled 2018-10-07 (×2): qty 1

## 2018-10-07 MED ORDER — SEVELAMER CARBONATE 800 MG PO TABS
800.0000 mg | ORAL_TABLET | Freq: Three times a day (TID) | ORAL | Status: DC
  Administered 2018-10-07: 800 mg via ORAL
  Filled 2018-10-07: qty 1

## 2018-10-07 MED ORDER — LIDOCAINE-PRILOCAINE 2.5-2.5 % EX CREA: 1.0000 "application " | TOPICAL_CREAM | CUTANEOUS | Status: DC | PRN

## 2018-10-07 NOTE — Progress Notes (Addendum)
Subjective: 1 Day Post-Op Procedure(s) (LRB): OPEN REDUCTION INTERNAL FIXATION (ORIF) RIGHT ANKLE TRIMALLEOLAR (Right) Patient reports pain as moderate and severe.    Objective: Vital signs in last 24 hours: Temp:  [97.9 F (36.6 C)-99.1 F (37.3 C)] 98.2 F (36.8 C) (10/22 0353) Pulse Rate:  [80-98] 82 (10/22 0353) Resp:  [14-30] 14 (10/22 0353) BP: (110-147)/(51-86) 137/62 (10/22 0353) SpO2:  [86 %-100 %] 96 % (10/22 0353) Weight:  [111.1 kg] 111.1 kg (10/21 1238)  Intake/Output from previous day: 10/21 0701 - 10/22 0700 In: 500 [I.V.:500] Out: 50 [Blood:50] Intake/Output this shift: No intake/output data recorded.  Recent Labs    10/06/18 1247  HGB 9.3*   Recent Labs    10/06/18 1247  WBC 12.6*  RBC 3.41*  HCT 31.4*  PLT 248   Recent Labs    10/06/18 1247 10/07/18 0458  NA 134* 135  K 3.6 4.5  CL 92* 92*  CO2 25 26  BUN 26* 48*  CREATININE 7.61* 10.35*  GLUCOSE 182* 180*  CALCIUM 9.0 9.0   No results for input(s): LABPT, INR in the last 72 hours.  fixator intact. block wearing off.  Dg Chest 2 View  Result Date: 10/06/2018 CLINICAL DATA:  Preop for RIGHT ankle surgery. History of hypertension, diabetes, dialysis, breast cancer status post mastectomy last year. EXAM: CHEST - 2 VIEW COMPARISON:  Chest x-ray dated 02/24/2018. FINDINGS: Stable mild cardiomegaly. Lungs are clear. No pleural effusion. Surgical clips over the LEFT chest wall, compatible with the given history of mastectomy. No acute or suspicious osseous finding. IMPRESSION: No active cardiopulmonary disease. No evidence of pneumonia or pulmonary edema. Electronically Signed   By: Franki Cabot M.D.   On: 10/06/2018 13:48   Dg Ankle 2 Views Right  Result Date: 10/06/2018 CLINICAL DATA:  47 year old female with ankle fracture. Subsequent encounter. EXAM: DG C-ARM 61-120 MIN; RIGHT ANKLE - 2 VIEW Fluoroscopic time: 46 seconds. COMPARISON:  10/03/2018. FINDINGS: Six intraoperative C-arm views  submitted for review after surgery. Fibular fracture reduced with sideplate and screws. Medial malleolar fracture treated with single screw (screw may traverse through portion of the medial malleolar fracture fragment which is slightly laterally located). Posterior malleolar fracture treated with single screw. Slight widening ankle mortise. Last image reveals what appear to be pins for external fixation in the right tibial shaft. IMPRESSION: Open reduction and internal fixation right ankle fracture as noted above. Electronically Signed   By: Genia Del M.D.   On: 10/06/2018 17:01   Dg C-arm 1-60 Min  Result Date: 10/06/2018 CLINICAL DATA:  47 year old female with ankle fracture. Subsequent encounter. EXAM: DG C-ARM 61-120 MIN; RIGHT ANKLE - 2 VIEW Fluoroscopic time: 46 seconds. COMPARISON:  10/03/2018. FINDINGS: Six intraoperative C-arm views submitted for review after surgery. Fibular fracture reduced with sideplate and screws. Medial malleolar fracture treated with single screw (screw may traverse through portion of the medial malleolar fracture fragment which is slightly laterally located). Posterior malleolar fracture treated with single screw. Slight widening ankle mortise. Last image reveals what appear to be pins for external fixation in the right tibial shaft. IMPRESSION: Open reduction and internal fixation right ankle fracture as noted above. Electronically Signed   By: Genia Del M.D.   On: 10/06/2018 17:01    Assessment/Plan: 1 Day Post-Op Procedure(s) (LRB): OPEN REDUCTION INTERNAL FIXATION (ORIF) RIGHT ANKLE TRIMALLEOLAR (Right) Up with therapy NWB.  Consult for dialysis . Expect here until Thursday then likely home. Needs 3 in 1, W/C with removable sidearm  with elevated leg rest. HHPT .   Patricia [name] 10/07/2018, 7:38 AM

## 2018-10-07 NOTE — Consult Note (Addendum)
[facility] Renal Consultation Note    Indication for Consultation:  Management of ESRD/hemodialysis; anemia, hypertension/volume and secondary hyperparathyroidism PCP: Delman Cheadle, PA-C  HPI: Patricia [name] is a 47 y.o. female with ESRD on hemodialysis MWF at [facility]. PMH of HTN, HFpEF, DMT2, obesity, OSA, AOCD, SHPT.   Patient had mechanical fall 10/02/2018, sustaining injury to R ankle. She presented to ED for evaluation where X-rays confirmed R trimalleoloar ankle fracture. She was brought here 10/06/2018 for ORIF R ankle trimalleolar per Dr. Lorin Mercy.   Currently she is up in chair, very pleasant without C/O pain at present. No other issues prior to fall, says she had been doing well. She knows her hemodialysis prescription which implies compliance with OP HD. Had treatment prior to coming to hospital 10/06/2018 but says they did not weigh her, unsure how much volume was actually removed. I saw her on HD - she is desirous of having HD 2 days in a row to get to her target weight  Past Medical History:  Diagnosis Date  . Anemia   . Ankle fracture   . Blood transfusion without reported diagnosis   . Cancer ([place])   . Diabetes mellitus without complication ([place])   . Dialysis patient ([place])    mon, wed, friday,   . GERD (gastroesophageal reflux disease)   . Hypertension   . OSA (obstructive sleep apnea)   . PONV (postoperative nausea and vomiting)   . Renal disorder   . Renal insufficiency   . Wears glasses    Past Surgical History:  Procedure Laterality Date  . ABDOMINAL HYSTERECTOMY    . AV FISTULA PLACEMENT  11/2014   at [place] N/A 07/10/2016   Procedure: BALLOON DILATION;  Surgeon: Danie Binder, MD;  Location: [facility];  Service: Endoscopy;  Laterality: N/A;  Pyloric dilation  . BREAST LUMPECTOMY    . CESAREAN SECTION    . CHOLECYSTECTOMY    . COLONOSCOPY WITH PROPOFOL N/A 09/27/2016   Dr. Gala Romney: Internal hemorrhoids  repeat colonoscopy in 10 years  . DILATION AND CURETTAGE OF UTERUS    . ESOPHAGOGASTRODUODENOSCOPY N/A 07/10/2016   Dr.Fields- normal esophagus, gastric stenosis was found at the pylorus, gastritis on bx, normal examined duodenun  . MASTECTOMY     left sided  . ORIF ANKLE FRACTURE Right 10/06/2018   Procedure: OPEN REDUCTION INTERNAL FIXATION (ORIF) RIGHT ANKLE TRIMALLEOLAR;  Surgeon: Marybelle Killings, MD;  Location: [place];  Service: Orthopedics;  Laterality: Right;   Family History  Problem Relation Age of Onset  . Diabetes Mellitus II Mother   . Hypertension Mother   . Hypertension Sister   . Hypertension Sister   . Colon cancer Neg Hx    Social History:  reports that she has never smoked. She has never used smokeless tobacco. She reports that she does not drink alcohol or use drugs. Allergies  Allergen Reactions  . Reglan [Metoclopramide] Other (See Comments)    hallucinations hallucinations  . Amlodipine Besylate Rash and Other (See Comments)    dizziness   Prior to Admission medications   Medication Sig Start Date End Date Taking? Authorizing Provider  acetaminophen (TYLENOL) 500 MG tablet Take 500 mg by mouth 3 (three) times daily as needed for moderate pain.   Yes [provider]  albuterol (PROVENTIL HFA;VENTOLIN HFA) 108 (90 Base) MCG/ACT inhaler Inhale 2 puffs into the lungs every 6 (six) hours as needed for wheezing or shortness of breath. 11/28/17  Yes Kathie Dike, MD  albuterol (PROVENTIL) (2.5 MG/3ML) 0.083% nebulizer solution Take 3 mLs (2.5 mg total) by nebulization every 6 (six) hours as needed for wheezing or shortness of breath. 11/28/17  Yes Memon, Jolaine Artist, MD  AMITIZA 24 MCG capsule TAKE 1 CAPSULE (24 MCG TOTAL) BY MOUTH 2 (TWO) TIMES DAILY WITH A MEAL. Patient taking differently: Take 24 mcg by mouth 2 (two) times daily.  07/23/18  Yes Annitta Needs, NP  cinacalcet (SENSIPAR) 30 MG tablet Take 30 mg by mouth every evening.    Yes [provider]  Ferric Citrate (AURYXIA) 1 GM 210 MG(Fe) TABS Take 2 tablets by mouth 2 (two) times daily with a meal. With Breakfast & with supper   Yes [provider]  fluticasone (FLONASE) 50 MCG/ACT nasal spray Place 2 sprays into both nostrils daily.  08/08/17  Yes [provider]  furosemide (LASIX) 40 MG tablet Take 40-80 mg by mouth 2 (two) times daily. Take 2 tablets (80mg ) in the morning and take 1 tablet (40mg ) at bedtime   Yes [provider]  glipiZIDE (GLUCOTROL) 5 MG tablet Take 5 mg by mouth 2 (two) times daily as needed. For blood sugar levels over 150   Yes [provider]  labetalol (NORMODYNE) 100 MG tablet Take 100 mg by mouth 2 (two) times daily. 08/24/18  Yes [provider]  multivitamin (RENA-VIT) TABS tablet Take 1 tablet by mouth daily.   Yes [provider]  NIFEdipine (PROCARDIA-XL/ADALAT CC) 60 MG 24 hr tablet Take 1 tablet (60 mg total) by mouth daily. Patient taking differently: Take 60 mg by mouth every evening.  02/28/18  Yes Kathie Dike, MD  ondansetron (ZOFRAN-ODT) 4 MG disintegrating tablet Take 4 mg by mouth every 8 (eight) hours as needed for nausea or vomiting.   Yes [provider]  oxyCODONE-acetaminophen (PERCOCET/ROXICET) 5-325 MG tablet Take 1 tablet by mouth every 4 (four) hours as needed for severe pain.    Yes [provider]  pantoprazole (PROTONIX) 40 MG tablet Take 40 mg by mouth daily.   Yes [provider]  rOPINIRole (REQUIP XL) 2 MG 24 hr tablet Take 2 mg by mouth at bedtime.  09/10/16  Yes [provider]  sevelamer carbonate (RENVELA) 800 MG tablet Take 800 mg by mouth See admin instructions. Take 2 tablets (1600 mg) in the morning, take 3 tablets (2400 mg) with lunch or snack,  and take 2 tablets (1600 mg) by mouth in the evening with dinner meal.   Yes [provider]   Current Facility-Administered Medications  Medication Dose Route Frequency Provider Last  Rate Last Dose  . 0.9 %  sodium chloride infusion  100 mL Intravenous PRN Valentina Gu, NP      . 0.9 %  sodium chloride infusion  100 mL Intravenous PRN Valentina Gu, NP      . acetaminophen (TYLENOL) tablet 1,000 mg  1,000 mg Oral TID Marybelle Killings, MD   1,000 mg at 10/06/18 2300  . ceFAZolin (ANCEF) IVPB 1 g/50 mL premix  1 g Intravenous Q24H Lanae Crumbly, PA-C      . Chlorhexidine Gluconate Cloth 2 % PADS 6 each  6 each Topical Q0600 Valentina Gu, NP      . docusate sodium (COLACE) capsule 100 mg  100 mg Oral BID Lanae Crumbly, PA-C   100 mg at 10/06/18 2300  . HYDROmorphone (DILAUDID) 1 MG/ML injection           .  HYDROmorphone (DILAUDID) injection 0.5 mg  0.5 mg Intravenous Q4H PRN Lanae Crumbly, PA-C   0.5 mg at 10/07/18 1136  . labetalol (NORMODYNE) tablet 100 mg  100 mg Oral BID Marybelle Killings, MD      . lidocaine (PF) (XYLOCAINE) 1 % injection 5 mL  5 mL Intradermal PRN Valentina Gu, NP      . lidocaine-prilocaine (EMLA) cream 1 application  1 application Topical PRN Valentina Gu, NP      . NIFEdipine (PROCARDIA XL/NIFEDICAL XL) 24 hr tablet 60 mg  60 mg Oral Daily Marybelle Killings, MD      . ondansetron [facility]) tablet 4 mg  4 mg Oral Q6H PRN Lanae Crumbly, PA-C       Or  . ondansetron [facility]) injection 4 mg  4 mg Intravenous Q6H PRN Lanae Crumbly, PA-C      . oxyCODONE (Oxy IR/ROXICODONE) immediate release tablet 5-10 mg  5-10 mg Oral Q6H PRN Lanae Crumbly, PA-C   10 mg at 10/07/18 0518  . pentafluoroprop-tetrafluoroeth (GEBAUERS) aerosol 1 application  1 application Topical PRN Valentina Gu, NP      . phenol (CHLORASEPTIC) mouth spray 1 spray  1 spray Mouth/Throat PRN Jessy Oto, MD   1 spray at 10/06/18 2014   Labs: Basic Metabolic Panel: Recent Labs  Lab 10/06/18 1247 10/07/18 0458 10/07/18 1001  NA 134* 135 135  K 3.6 4.5 3.6  CL 92* 92* 95*  CO2 25 26 25   GLUCOSE 182* 180* 139*  BUN 26* 48* 40*  CREATININE 7.61*  10.35* 8.20*  CALCIUM 9.0 9.0 8.9  PHOS  --   --  5.3*   Liver Function Tests: Recent Labs  Lab 10/07/18 0458 10/07/18 1001  AST 15  --   ALT 14  --   ALKPHOS 92  --   BILITOT 0.6  --   PROT 6.7  --   ALBUMIN 3.4* 3.5   No results for input(s): LIPASE, AMYLASE in the last 168 hours. No results for input(s): AMMONIA in the last 168 hours. CBC: Recent Labs  Lab 10/06/18 1247 10/07/18 1001  WBC 12.6* 11.3*  HGB 9.3* 9.0*  HCT 31.4* 29.7*  MCV 92.1 90.8  PLT 248 233   Cardiac Enzymes: No results for input(s): CKTOTAL, CKMB, CKMBINDEX, TROPONINI in the last 168 hours. CBG: Recent Labs  Lab 10/06/18 1239 10/06/18 1636 10/07/18 0716  GLUCAP 181* 110* 163*   Iron Studies: No results for input(s): IRON, TIBC, TRANSFERRIN, FERRITIN in the last 72 hours. Studies/Results: Dg Chest 2 View  Result Date: 10/06/2018 CLINICAL DATA:  Preop for RIGHT ankle surgery. History of hypertension, diabetes, dialysis, breast cancer status post mastectomy last year. EXAM: CHEST - 2 VIEW COMPARISON:  Chest x-ray dated 02/24/2018. FINDINGS: Stable mild cardiomegaly. Lungs are clear. No pleural effusion. Surgical clips over the LEFT chest wall, compatible with the given history of mastectomy. No acute or suspicious osseous finding. IMPRESSION: No active cardiopulmonary disease. No evidence of pneumonia or pulmonary edema. Electronically Signed   By: Franki Cabot M.D.   On: 10/06/2018 13:48   Dg Ankle 2 Views Right  Result Date: 10/06/2018 CLINICAL DATA:  47 year old female with ankle fracture. Subsequent encounter. EXAM: DG C-ARM 61-120 MIN; RIGHT ANKLE - 2 VIEW Fluoroscopic time: 46 seconds. COMPARISON:  10/03/2018. FINDINGS: Six intraoperative C-arm views submitted for review after surgery. Fibular fracture reduced with sideplate and screws. Medial malleolar fracture treated with single screw (screw may  traverse through portion of the medial malleolar fracture fragment which is slightly laterally  located). Posterior malleolar fracture treated with single screw. Slight widening ankle mortise. Last image reveals what appear to be pins for external fixation in the right tibial shaft. IMPRESSION: Open reduction and internal fixation right ankle fracture as noted above. Electronically Signed   By: Genia Del M.D.   On: 10/06/2018 17:01   Dg C-arm 1-60 Min  Result Date: 10/06/2018 CLINICAL DATA:  47 year old female with ankle fracture. Subsequent encounter. EXAM: DG C-ARM 61-120 MIN; RIGHT ANKLE - 2 VIEW Fluoroscopic time: 46 seconds. COMPARISON:  10/03/2018. FINDINGS: Six intraoperative C-arm views submitted for review after surgery. Fibular fracture reduced with sideplate and screws. Medial malleolar fracture treated with single screw (screw may traverse through portion of the medial malleolar fracture fragment which is slightly laterally located). Posterior malleolar fracture treated with single screw. Slight widening ankle mortise. Last image reveals what appear to be pins for external fixation in the right tibial shaft. IMPRESSION: Open reduction and internal fixation right ankle fracture as noted above. Electronically Signed   By: Genia Del M.D.   On: 10/06/2018 17:01    ROS: As per HPI otherwise negative.   Physical Exam: Vitals:   10/07/18 0113 10/07/18 0353 10/07/18 0930 10/07/18 0943  BP: 117/60 137/62 (!) 138/95 (!) 147/67  Pulse: 80 82 86 82  Resp: 16 14 18    Temp: 98.7 F (37.1 C) 98.2 F (36.8 C) 98 F (36.7 C)   TempSrc: Oral Oral Oral   SpO2: 95% 96% 93%   Weight:   117.6 kg   Height:         General: Well developed, well nourished, in no acute distress. Head: Normocephalic, atraumatic, sclera non-icteric, mucus membranes are moist Neck: Supple. JVD not elevated. Lungs: Clear bilaterally to auscultation without wheezes, rales, or rhonchi. Breathing is unlabored. Heart: RRR with S1 S2. No murmurs, rubs, or gallops appreciated. Abdomen: Soft, non-tender,  non-distended with normoactive bowel sounds. No rebound/guarding. No obvious abdominal masses. M-S:  Strength and tone appear normal for age. Lower extremities: RLE with external fixation device R ankle. No LLE edema.   Neuro: Alert and oriented X 3. Moves all extremities spontaneously. Psych:  Responds to questions appropriately with a normal affect. Dialysis Access: L AVF + bruit  Dialysis Orders: [facility] MWF 4 hrs 15 min 400/600 112.5 kg 2.0 K/2.5 Ca Linear Sodium 148 UFP 2 LUA AVF -Heparin 1000 units IV load/1800 units hourly stop 60 minutes prior to end of HD -Hectorol 85mcg IV TIW -Epogen 7400 units IV TIW   Assessment/Plan: 1.  S/P ORIF R ankle trimalleolar per Dr. Lorin Mercy. Post Op day 1.  2.  ESRD -  MWF. HD off schedule today and again tomorrow to get back on schedule. Hold heparin. K+ 3.6 using 4.0 K bath 3.  Hypertension/volume  - BP well controlled. Approximately 5 kg above OP EDW. Attempting 4 liters today. No evidence of volume overload by CXR or exam.  4.  Anemia  - HGB 9.0 Aranesp 60 mcg IV today. Follow HGB.  5.  Metabolic bone disease -  Ca 8.9 Phos 5.3. Continue binders, VDRA, Sensipar.  6.  Nutrition -Albumin 3.5.Change to renal/carb mod diet, add nepro/renal vit 7.  DM-per primary 8.  H/O HFpEF-EF 60-65% 11/27/17 9.  H/O OSA. Doesn't wear CPAP regularly   Patricia H. Owens Shark, NP-C 10/07/2018, 12:20 PM  [employer] [phone]  Patient seen and examined, agree with above  note with above modifications. Seen in HD - NAD- some pain in ankle- is compliant HD pt- done today off schedule- will do tomorrow to get back on schedule- I told her she could run shorter tomorrow- biggest issue is volume but should be able to get to EDW os 112.5 tomorrow.  Cont binders, vit D and ESA   Corliss Parish, MD 10/07/2018
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
## 2018-10-07 NOTE — Procedures (Signed)
I was present at this dialysis session. I have reviewed the session itself and made appropriate changes.   Vital signs in last 24 hours:  Temp:  [97.9 F (36.6 C)-99.1 F (37.3 C)] 98.2 F (36.8 C) (10/22 0353) Pulse Rate:  [80-98] 82 (10/22 0353) Resp:  [14-30] 14 (10/22 0353) BP: (110-147)/(51-86) 137/62 (10/22 0353) SpO2:  [86 %-100 %] 96 % (10/22 0353) Weight:  [111.1 kg] 111.1 kg (10/21 1238) Weight change:  Filed Weights   10/06/18 1238  Weight: 111.1 kg    Recent Labs  Lab 10/07/18 0458  NA 135  K 4.5  CL 92*  CO2 26  GLUCOSE 180*  BUN 48*  CREATININE 10.35*  CALCIUM 9.0    Recent Labs  Lab 10/06/18 1247  WBC 12.6*  HGB 9.3*  HCT 31.4*  MCV 92.1  PLT 248    Scheduled Meds: . acetaminophen  1,000 mg Oral TID  . Chlorhexidine Gluconate Cloth  6 each Topical Q0600  . docusate sodium  100 mg Oral BID  . labetalol  100 mg Oral BID  . NIFEdipine  60 mg Oral Daily   Continuous Infusions: .  ceFAZolin (ANCEF) IV     PRN Meds:.HYDROmorphone (DILAUDID) injection, ondansetron **OR** ondansetron (ZOFRAN) IV, oxyCODONE, phenol   Donetta Potts,  MD 10/07/2018, 9:46 AM

## 2018-10-07 NOTE — Progress Notes (Signed)
0930 Pt to Hemodialysis via bed. Report was given to [facility].

## 2018-10-07 NOTE — Progress Notes (Signed)
PT Cancellation Note  Patient Details Name: Shatasha Lambing MRN: [id number] DOB: [birth year]   Cancelled Treatment:    Reason Eval/Treat Not Completed: Patient at procedure or test/unavailable(HD)   Naraly Fritcher B Korrina Zern 10/07/2018, 10:01 AM  Elwyn Reach, PT Acute Rehabilitation Services Pager: [phone] Office: [phone]

## 2018-10-08 ENCOUNTER — Encounter (HOSPITAL_COMMUNITY): Payer: Self-pay | Admitting: Orthopaedic Surgery

## 2018-10-08 ENCOUNTER — Telehealth (INDEPENDENT_AMBULATORY_CARE_PROVIDER_SITE_OTHER): Payer: Self-pay | Admitting: Orthopaedic Surgery

## 2018-10-08 LAB — CBC
HEMATOCRIT: 33.2 % — AB (ref 36.0–46.0)
Hemoglobin: 10.2 g/dL — ABNORMAL LOW (ref 12.0–15.0)
MCH: 28 pg (ref 26.0–34.0)
MCHC: 30.7 g/dL (ref 30.0–36.0)
MCV: 91.2 fL (ref 80.0–100.0)
NRBC: 0 % (ref 0.0–0.2)
Platelets: 215 10*3/uL (ref 150–400)
RBC: 3.64 MIL/uL — ABNORMAL LOW (ref 3.87–5.11)
RDW: 14 % (ref 11.5–15.5)
WBC: 10.3 10*3/uL (ref 4.0–10.5)

## 2018-10-08 LAB — COMPREHENSIVE METABOLIC PANEL WITH GFR
ALT: 6 U/L (ref 0–44)
AST: 24 U/L (ref 15–41)
Albumin: 3.5 g/dL (ref 3.5–5.0)
Alkaline Phosphatase: 93 U/L (ref 38–126)
Anion gap: 16 — ABNORMAL HIGH (ref 5–15)
BUN: 36 mg/dL — ABNORMAL HIGH (ref 6–20)
CO2: 26 mmol/L (ref 22–32)
Calcium: 9.3 mg/dL (ref 8.9–10.3)
Chloride: 95 mmol/L — ABNORMAL LOW (ref 98–111)
Creatinine, Ser: 7.89 mg/dL — ABNORMAL HIGH (ref 0.44–1.00)
GFR calc Af Amer: 6 mL/min — ABNORMAL LOW
GFR calc non Af Amer: 5 mL/min — ABNORMAL LOW
Glucose, Bld: 157 mg/dL — ABNORMAL HIGH (ref 70–99)
Potassium: 4.2 mmol/L (ref 3.5–5.1)
Sodium: 137 mmol/L (ref 135–145)
Total Bilirubin: 0.8 mg/dL (ref 0.3–1.2)
Total Protein: 7.5 g/dL (ref 6.5–8.1)

## 2018-10-08 LAB — GLUCOSE, CAPILLARY
GLUCOSE-CAPILLARY: 305 mg/dL — AB (ref 70–99)
Glucose-Capillary: 147 mg/dL — ABNORMAL HIGH (ref 70–99)
Glucose-Capillary: 182 mg/dL — ABNORMAL HIGH (ref 70–99)
Glucose-Capillary: 183 mg/dL — ABNORMAL HIGH (ref 70–99)

## 2018-10-08 MED ORDER — BISACODYL 10 MG RE SUPP
10.0000 mg | Freq: Once | RECTAL | Status: AC
  Administered 2018-10-08: 10 mg via RECTAL
  Filled 2018-10-08: qty 1

## 2018-10-08 MED ORDER — DOXERCALCIFEROL 4 MCG/2ML IV SOLN
INTRAVENOUS | Status: AC
  Filled 2018-10-08: qty 4

## 2018-10-08 MED ORDER — OXYCODONE-ACETAMINOPHEN 7.5-325 MG PO TABS
1.0000 | ORAL_TABLET | ORAL | Status: DC | PRN
  Administered 2018-10-08 – 2018-10-09 (×4): 2 via ORAL
  Filled 2018-10-08 (×4): qty 2

## 2018-10-08 MED ORDER — HYDROMORPHONE HCL 1 MG/ML IJ SOLN
INTRAMUSCULAR | Status: AC
  Filled 2018-10-08: qty 0.5

## 2018-10-08 NOTE — Progress Notes (Signed)
   Subjective: 2 Days Post-Op Procedure(s) (LRB): OPEN REDUCTION INTERNAL FIXATION (ORIF) RIGHT ANKLE TRIMALLEOLAR (Right) Patient reports pain as moderate and severe.    Objective: Vital signs in last 24 hours: Temp:  [98.3 F (36.8 C)-99.4 F (37.4 C)] 98.3 F (36.8 C) (10/23 1049) Pulse Rate:  [88-105] 101 (10/23 1049) Resp:  [14-24] 20 (10/23 1049) BP: (86-127)/(43-71) 121/68 (10/23 1049) SpO2:  [92 %-95 %] 95 % (10/23 1049) Weight:  [112.5 kg-114.5 kg] 112.5 kg (10/23 1049)  Intake/Output from previous day: 10/22 0701 - 10/23 0700 In: 360 [P.O.:360] Out: 3000  Intake/Output this shift: Total I/O In: -  Out: 2000 [Other:2000]  Recent Labs    10/06/18 1247 10/07/18 1001 10/08/18 0751  HGB 9.3* 9.0* 10.2*   Recent Labs    10/07/18 1001 10/08/18 0751  WBC 11.3* 10.3  RBC 3.27* 3.64*  HCT 29.7* 33.2*  PLT 233 215   Recent Labs    10/07/18 1001 10/08/18 0102  NA 135 137  K 3.6 4.2  CL 95* 95*  CO2 25 26  BUN 40* 36*  CREATININE 8.20* 7.89*  GLUCOSE 139* 157*  CALCIUM 8.9 9.3   No results for input(s): LABPT, INR in the last 72 hours.  external fix intact. dressing changed. OOB to recliner.  No results found.  Assessment/Plan: 2 Days Post-Op Procedure(s) (LRB): OPEN REDUCTION INTERNAL FIXATION (ORIF) RIGHT ANKLE TRIMALLEOLAR (Right) Plan: up with therapy.  Marybelle Killings 10/08/2018, 2:10 PM

## 2018-10-08 NOTE — Telephone Encounter (Signed)
Op note & last ov note faxed to [place] to support need for wheelchair fax# [phone]

## 2018-10-08 NOTE — Progress Notes (Signed)
Subjective:  Seen on HD- c/o constipation Objective Vital signs in last 24 hours: Vitals:   10/08/18 0719 10/08/18 0724 10/08/18 0800 10/08/18 0830  BP: 127/70 119/71 117/71 (!) 101/55  Pulse: 90 89 94 (!) 101  Resp: 15 14 19 18   Temp:      TempSrc:      SpO2:      Weight:      Height:       Weight change: 6.469 kg  Intake/Output Summary (Last 24 hours) at 10/08/2018 0935 Last data filed at 10/07/2018 1545 Gross per 24 hour  Intake 240 ml  Output 3000 ml  Net -2760 ml   Dialysis Orders: [place] [facility] MWF 4 hrs 15 min 400/600 112.5 kg 2.0 K/2.5 Ca Linear Sodium 148 UFP 2 LUA AVF -Heparin 1000 units IV load/1800 units hourly stop 60 minutes prior to end of HD -Hectorol 16mcg IV TIW -Epogen 7400 units IV TIW   Assessment/Plan: 1.  S/P ORIF R ankle trimalleolar per Dr. Lorin Mercy. Post Op day 2.  2.  ESRD -  MWF. HD today to get back on schedule. Hold heparin. K+ 4.2 using 3.0 K bath 3.  Hypertension/volume  - BP well controlled. Approximately 5 kg above OP EDW. Attempting 2 liters today to get to EDW. No evidence of volume overload by CXR or exam.  4.  Anemia  - HGB 9.0-->10.2  Aranesp 60 mcg given. Follow HGB.  5.  Metabolic bone disease -  Ca 8.9 Phos 5.3. Continue binders, VDRA, Sensipar.  Given constipation will stop renvela and give [place] only  6.  Nutrition -Albumin 3.5.Change to renal/carb mod diet, add nepro/renal vit 7.  DM-per primary 8.  H/O HFpEF-EF 60-65% 11/27/17.  BP good - on labetalol and procardia 9.  H/O OSA. Doesn't wear CPAP regularly     [place]: Basic Metabolic Panel: Recent Labs  Lab 10/07/18 0458 10/07/18 1001 10/08/18 0102  NA 135 135 137  K 4.5 3.6 4.2  CL 92* 95* 95*  CO2 26 25 26   GLUCOSE 180* 139* 157*  BUN 48* 40* 36*  CREATININE 10.35* 8.20* 7.89*  CALCIUM 9.0 8.9 9.3  PHOS  --  5.3*  --    Liver Function Tests: Recent Labs  Lab 10/07/18 0458 10/07/18 1001 10/08/18 0102  AST 15  --  24  ALT 14   --  6  ALKPHOS 92  --  93  BILITOT 0.6  --  0.8  PROT 6.7  --  7.5  ALBUMIN 3.4* 3.5 3.5   No results for input(s): LIPASE, AMYLASE in the last 168 hours. No results for input(s): AMMONIA in the last 168 hours. CBC: Recent Labs  Lab 10/06/18 1247 10/07/18 1001 10/08/18 0751  WBC 12.6* 11.3* 10.3  HGB 9.3* 9.0* 10.2*  HCT 31.4* 29.7* 33.2*  MCV 92.1 90.8 91.2  PLT 248 233 215   Cardiac Enzymes: No results for input(s): CKTOTAL, CKMB, CKMBINDEX, TROPONINI in the last 168 hours. CBG: Recent Labs  Lab 10/07/18 0716 10/07/18 1500 10/07/18 1756 10/07/18 2220 10/08/18 0626  GLUCAP 163* 292* 328* 117* 147*    Iron Studies: No results for input(s): IRON, TIBC, TRANSFERRIN, FERRITIN in the last 72 hours. Studies/Results: Dg Chest 2 View  Result Date: 10/06/2018 CLINICAL DATA:  Preop for RIGHT ankle surgery. History of hypertension, diabetes, dialysis, breast cancer status post mastectomy last year. EXAM: CHEST - 2 VIEW COMPARISON:  Chest x-ray dated 02/24/2018. FINDINGS: Stable mild cardiomegaly. Lungs  are clear. No pleural effusion. Surgical clips over the LEFT chest wall, compatible with the given history of mastectomy. No acute or suspicious osseous finding. IMPRESSION: No active cardiopulmonary disease. No evidence of pneumonia or pulmonary edema. Electronically Signed   By: Franki Cabot M.D.   On: 10/06/2018 13:48   Dg Ankle 2 Views Right  Result Date: 10/06/2018 CLINICAL DATA:  47 year old female with ankle fracture. Subsequent encounter. EXAM: DG C-ARM 61-120 MIN; RIGHT ANKLE - 2 VIEW Fluoroscopic time: 46 seconds. COMPARISON:  10/03/2018. FINDINGS: Six intraoperative C-arm views submitted for review after surgery. Fibular fracture reduced with sideplate and screws. Medial malleolar fracture treated with single screw (screw may traverse through portion of the medial malleolar fracture fragment which is slightly laterally located). Posterior malleolar fracture treated with  single screw. Slight widening ankle mortise. Last image reveals what appear to be pins for external fixation in the right tibial shaft. IMPRESSION: Open reduction and internal fixation right ankle fracture as noted above. Electronically Signed   By: Genia Del M.D.   On: 10/06/2018 17:01   Dg C-arm 1-60 Min  Result Date: 10/06/2018 CLINICAL DATA:  47 year old female with ankle fracture. Subsequent encounter. EXAM: DG C-ARM 61-120 MIN; RIGHT ANKLE - 2 VIEW Fluoroscopic time: 46 seconds. COMPARISON:  10/03/2018. FINDINGS: Six intraoperative C-arm views submitted for review after surgery. Fibular fracture reduced with sideplate and screws. Medial malleolar fracture treated with single screw (screw may traverse through portion of the medial malleolar fracture fragment which is slightly laterally located). Posterior malleolar fracture treated with single screw. Slight widening ankle mortise. Last image reveals what appear to be pins for external fixation in the right tibial shaft. IMPRESSION: Open reduction and internal fixation right ankle fracture as noted above. Electronically Signed   By: Genia Del M.D.   On: 10/06/2018 17:01   Medications: Infusions:   Scheduled Medications: . acetaminophen  1,000 mg Oral TID  . Chlorhexidine Gluconate Cloth  6 each Topical Q0600  . cinacalcet  30 mg Oral Q supper  . darbepoetin (ARANESP) injection - DIALYSIS  60 mcg Intravenous Q Wed-HD  . docusate sodium  100 mg Oral BID  . doxercalciferol      . doxercalciferol  7 mcg Intravenous Q M,W,F-HD  . feeding supplement (NEPRO CARB STEADY)  237 mL Oral BID BM  . ferric citrate  210 mg Oral BID WC  . glipiZIDE  5 mg Oral BID AC  . insulin aspart  0-9 Units Subcutaneous TID WC  . labetalol  100 mg Oral BID  . multivitamin  1 tablet Oral QHS  . NIFEdipine  60 mg Oral Daily  . sevelamer carbonate  800 mg Oral TID WC    have reviewed scheduled and prn medications.  Physical Exam: General: NAD Heart:  RRR Lungs: mostly clear Abdomen: soft, non tender Extremities: min edema Dialysis Access: left AVF     10/08/2018,9:35 AM  LOS: 2 days

## 2018-10-08 NOTE — Progress Notes (Signed)
OT Cancellation Note  Patient Details Name: Patricia [name] MRN: [id number] DOB: Apr 01, 1971   Cancelled Treatment:    Reason Eval/Treat Not Completed: Patient at procedure or test/ unavailable(HD)  Merri Ray Shaymus Eveleth 10/08/2018, 8:47 AM  Hulda Humphrey OTR/L Acute Rehabilitation Services Pager: [phone] Office: [phone]

## 2018-10-08 NOTE — Progress Notes (Signed)
Inpatient Diabetes Program Recommendations  AACE/ADA: New Consensus Statement on Inpatient Glycemic Control (2015)  Target Ranges:  Prepandial:   less than 140 mg/dL      Peak postprandial:   less than 180 mg/dL (1-2 hours)      Critically ill patients:  140 - 180 mg/dL   Lab Results  Component Value Date   GLUCAP 182 (H) 10/08/2018   HGBA1C 7.4 (H) 10/06/2018      Results for PORSCHEA, BORYS (MRN [id number]) as of 10/08/2018 14:34  Ref. Range 10/07/2018 17:56 10/07/2018 22:20 10/08/2018 06:26 10/08/2018 12:14  Glucose-Capillary Latest Ref Range: 70 - 99 mg/dL 328 (H) 117 (H) 147 (H) 182 (H)    DM2  Home DM meds: Glipizide 5 mg BID  Current DM inpatient meds: Glipizide 5 mg BID                                               Novolog (0-9 units) sensitive scale tid ac   MD please consider the following inpatient recommendations:  Discontinue oral diabetes meds while in the hospital.    Thank you.  -- Will follow during hospitalization.--  Jonna Clark RN, MSN Diabetes Coordinator Inpatient Glycemic Control Team Team Pager: [phone] (8am-5pm)

## 2018-10-08 NOTE — Progress Notes (Signed)
Patient to HD 

## 2018-10-08 NOTE — Procedures (Signed)
Patient was seen on dialysis and the procedure was supervised.  BFR 400  Via AVF BP is  98/54.   Patient appears to be tolerating treatment well  [name] 10/08/2018

## 2018-10-08 NOTE — Anesthesia Postprocedure Evaluation (Signed)
Anesthesia Post Note  Patient: Patricia [name]  Procedure(s) Performed: OPEN REDUCTION INTERNAL FIXATION (ORIF) RIGHT ANKLE TRIMALLEOLAR (Right Ankle)     Patient location during evaluation: PACU Anesthesia Type: General and Regional Level of consciousness: awake and alert Pain management: pain level controlled Vital Signs Assessment: post-procedure vital signs reviewed and stable Respiratory status: spontaneous breathing, nonlabored ventilation, respiratory function stable and patient connected to nasal cannula oxygen Cardiovascular status: blood pressure returned to baseline and stable Postop Assessment: no apparent nausea or vomiting Anesthetic complications: no    Last Vitals:  Vitals:   10/08/18 1049 10/08/18 1708  BP: 121/68 (!) 142/115  Pulse: (!) 101 (!) 118  Resp: 20   Temp: 36.8 C 36.9 C  SpO2: 95% 100%    Last Pain:  Vitals:   10/08/18 1708  TempSrc: Oral  PainSc:                  [name]

## 2018-10-08 NOTE — Progress Notes (Signed)
PT Cancellation Note  Patient Details Name: Patricia [name] MRN: [id number] DOB: [birth year]   Cancelled Treatment:    Reason Eval/Treat Not Completed: Other (comment) attempted multiple times to see patient- in the morning she was in dialysis and in the afternoon she declined PT due to pain and fatigue. Will follow and attempt to return if time/schedule allow, otherwise will attempt on next day of service.    Deniece Ree PT, DPT, CBIS  Supplemental Physical Therapist [facility]    Pager [phone] Acute Rehab Office [phone]
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
## 2018-10-09 LAB — COMPREHENSIVE METABOLIC PANEL
ALT: 7 U/L (ref 0–44)
ANION GAP: 17 — AB (ref 5–15)
AST: 15 U/L (ref 15–41)
Albumin: 3.6 g/dL (ref 3.5–5.0)
Alkaline Phosphatase: 100 U/L (ref 38–126)
BUN: 37 mg/dL — ABNORMAL HIGH (ref 6–20)
CO2: 25 mmol/L (ref 22–32)
Calcium: 9.8 mg/dL (ref 8.9–10.3)
Chloride: 94 mmol/L — ABNORMAL LOW (ref 98–111)
Creatinine, Ser: 7.33 mg/dL — ABNORMAL HIGH (ref 0.44–1.00)
GFR calc Af Amer: 7 mL/min — ABNORMAL LOW (ref >60)
GFR, EST NON AFRICAN AMERICAN: 6 mL/min — AB (ref >60)
Glucose, Bld: 183 mg/dL — ABNORMAL HIGH (ref 70–99)
POTASSIUM: 3.9 mmol/L (ref 3.5–5.1)
Sodium: 136 mmol/L (ref 135–145)
TOTAL PROTEIN: 8 g/dL (ref 6.5–8.1)
Total Bilirubin: 0.6 mg/dL (ref 0.3–1.2)

## 2018-10-09 LAB — GLUCOSE, CAPILLARY
GLUCOSE-CAPILLARY: 166 mg/dL — AB (ref 70–99)
Glucose-Capillary: 213 mg/dL — ABNORMAL HIGH (ref 70–99)

## 2018-10-09 MED ORDER — OXYCODONE-ACETAMINOPHEN 7.5-325 MG PO TABS: 1.0000 | ORAL_TABLET | Freq: Four times a day (QID) | ORAL | 0 refills | Status: DC | PRN

## 2018-10-09 NOTE — Progress Notes (Signed)
Subjective:  HD yest- removed 2000- tolerated well, hit EDW - mobility issues but says she is going home today  Objective Vital signs in last 24 hours: Vitals:   10/08/18 2354 10/09/18 0349 10/09/18 0827 10/09/18 0951  BP: (!) 143/67 117/64 (!) 105/52 119/70  Pulse: 100 96 87 96  Resp: 12 18 16    Temp: 98.3 F (36.8 C) 98.9 F (37.2 C) 98.5 F (36.9 C)   TempSrc: Oral Oral Oral   SpO2: 98% 92% 98%   Weight:      Height:       Weight change: -5.1 kg  Intake/Output Summary (Last 24 hours) at 10/09/2018 1006 Last data filed at 10/09/2018 0841 Gross per 24 hour  Intake 958 ml  Output 2002 ml  Net -1044 ml   Dialysis Orders: [facility] MWF 4 hrs 15 min 400/600 112.5 kg 2.0 K/2.5 Ca Linear Sodium 148 UFP 2 LUA AVF -Heparin 1000 units IV load/1800 units hourly stop 60 minutes prior to end of HD -Hectorol 33mcg IV TIW -Epogen 7400 units IV TIW   Assessment/Plan: 1.  S/P ORIF R ankle trimalleolar per Dr. Lorin Mercy. Post Op day 3.  2.  ESRD -  MWF. Hold heparin. K+ 4.2 using 3.0 K bath- next due tomorrow when she will go to her OP unit  3.  Hypertension/volume  - BP well controlled. to OP EDW yest. No evidence of volume overload by CXR or exam.  4.  Anemia  - HGB 9.0-->10.2  Aranesp 60 mcg given. Follow HGB.  5.  Metabolic bone disease -  Ca 8.9 Phos 5.3. Continue binders, VDRA, Sensipar.  Given constipation will stop renvela and give [place] only  6.  Nutrition -Albumin 3.6.Change to renal/carb mod diet, add nepro/renal vit 7.  DM-per primary 8.  H/O HFpEF-EF 60-65% 11/27/17.  BP good - on labetalol and procardia - BP kind of low- she said Dr. B was trying to wean her labetatolol which I agree with  9.  H/O OSA. Doesn't wear CPAP regularly     [place]: Basic Metabolic Panel: Recent Labs  Lab 10/07/18 1001 10/08/18 0102 10/09/18 0230  NA 135 137 136  K 3.6 4.2 3.9  CL 95* 95* 94*  CO2 25 26 25   GLUCOSE 139* 157* 183*  BUN 40* 36* 37*   CREATININE 8.20* 7.89* 7.33*  CALCIUM 8.9 9.3 9.8  PHOS 5.3*  --   --    Liver Function Tests: Recent Labs  Lab 10/07/18 0458 10/07/18 1001 10/08/18 0102 10/09/18 0230  AST 15  --  24 15  ALT 14  --  6 7  ALKPHOS 92  --  93 100  BILITOT 0.6  --  0.8 0.6  PROT 6.7  --  7.5 8.0  ALBUMIN 3.4* 3.5 3.5 3.6   No results for input(s): LIPASE, AMYLASE in the last 168 hours. No results for input(s): AMMONIA in the last 168 hours. CBC: Recent Labs  Lab 10/06/18 1247 10/07/18 1001 10/08/18 0751  WBC 12.6* 11.3* 10.3  HGB 9.3* 9.0* 10.2*  HCT 31.4* 29.7* 33.2*  MCV 92.1 90.8 91.2  PLT 248 233 215   Cardiac Enzymes: No results for input(s): CKTOTAL, CKMB, CKMBINDEX, TROPONINI in the last 168 hours. CBG: Recent Labs  Lab 10/08/18 0626 10/08/18 1214 10/08/18 1638 10/08/18 2152 10/09/18 0622  GLUCAP 147* 182* 183* 305* 166*    Iron Studies: No results for input(s): IRON, TIBC, TRANSFERRIN, FERRITIN in the last 72  hours. Studies/Results: No results found. Medications: Infusions:   Scheduled Medications: . acetaminophen  1,000 mg Oral TID  . Chlorhexidine Gluconate Cloth  6 each Topical Q0600  . cinacalcet  30 mg Oral Q supper  . darbepoetin (ARANESP) injection - DIALYSIS  60 mcg Intravenous Q Wed-HD  . docusate sodium  100 mg Oral BID  . doxercalciferol  7 mcg Intravenous Q M,W,F-HD  . feeding supplement (NEPRO CARB STEADY)  237 mL Oral BID BM  . ferric citrate  210 mg Oral BID WC  . glipiZIDE  5 mg Oral BID AC  . insulin aspart  0-9 Units Subcutaneous TID WC  . labetalol  100 mg Oral BID  . multivitamin  1 tablet Oral QHS  . NIFEdipine  60 mg Oral Daily    have reviewed scheduled and prn medications.  Physical Exam: General: NAD Heart: RRR Lungs: mostly clear Abdomen: soft, non tender Extremities: min edema Dialysis Access: left AVF     10/09/2018,10:06 AM  LOS: 3 days

## 2018-10-09 NOTE — Discharge Instructions (Signed)
Elevate foot above heart to decrease pain and decrease swelling. See Dr. Lorin Mercy in one week. Clean pin tracts daily as instructed.
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
## 2018-10-09 NOTE — Care Management Important Message (Signed)
Important Message  Patient Details  Name: Patricia [name] MRN: [id number] Date of Birth: 24-Jun-1971   Medicare Important Message Given:  Yes    Jennavecia Schwier 10/09/2018, 11:54 AM
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
## 2018-10-10 ENCOUNTER — Telehealth (INDEPENDENT_AMBULATORY_CARE_PROVIDER_SITE_OTHER): Payer: Self-pay | Admitting: Orthopaedic Surgery

## 2018-10-10 DIAGNOSIS — Z79899 Other long term (current) drug therapy: Secondary | ICD-10-CM | POA: Diagnosis not present

## 2018-10-10 DIAGNOSIS — I5032 Chronic diastolic (congestive) heart failure: Secondary | ICD-10-CM | POA: Diagnosis not present

## 2018-10-10 DIAGNOSIS — G4733 Obstructive sleep apnea (adult) (pediatric): Secondary | ICD-10-CM | POA: Diagnosis not present

## 2018-10-10 DIAGNOSIS — W1842XD Slipping, tripping and stumbling without falling due to stepping into hole or opening, subsequent encounter: Secondary | ICD-10-CM | POA: Diagnosis not present

## 2018-10-10 DIAGNOSIS — R112 Nausea with vomiting, unspecified: Secondary | ICD-10-CM | POA: Diagnosis not present

## 2018-10-10 DIAGNOSIS — N186 End stage renal disease: Secondary | ICD-10-CM | POA: Diagnosis not present

## 2018-10-10 DIAGNOSIS — Z23 Encounter for immunization: Secondary | ICD-10-CM | POA: Diagnosis not present

## 2018-10-10 DIAGNOSIS — S82851D Displaced trimalleolar fracture of right lower leg, subsequent encounter for closed fracture with routine healing: Secondary | ICD-10-CM | POA: Diagnosis not present

## 2018-10-10 DIAGNOSIS — M8580 Other specified disorders of bone density and structure, unspecified site: Secondary | ICD-10-CM | POA: Diagnosis not present

## 2018-10-10 DIAGNOSIS — E669 Obesity, unspecified: Secondary | ICD-10-CM | POA: Diagnosis not present

## 2018-10-10 DIAGNOSIS — E1122 Type 2 diabetes mellitus with diabetic chronic kidney disease: Secondary | ICD-10-CM | POA: Diagnosis not present

## 2018-10-10 DIAGNOSIS — Z6841 Body Mass Index (BMI) 40.0 and over, adult: Secondary | ICD-10-CM | POA: Diagnosis not present

## 2018-10-10 DIAGNOSIS — D509 Iron deficiency anemia, unspecified: Secondary | ICD-10-CM | POA: Diagnosis not present

## 2018-10-10 DIAGNOSIS — Z7984 Long term (current) use of oral hypoglycemic drugs: Secondary | ICD-10-CM | POA: Diagnosis not present

## 2018-10-10 DIAGNOSIS — Z992 Dependence on renal dialysis: Secondary | ICD-10-CM | POA: Diagnosis not present

## 2018-10-10 DIAGNOSIS — I132 Hypertensive heart and chronic kidney disease with heart failure and with stage 5 chronic kidney disease, or end stage renal disease: Secondary | ICD-10-CM | POA: Diagnosis not present

## 2018-10-10 DIAGNOSIS — D649 Anemia, unspecified: Secondary | ICD-10-CM | POA: Diagnosis not present

## 2018-10-10 DIAGNOSIS — Z9012 Acquired absence of left breast and nipple: Secondary | ICD-10-CM | POA: Diagnosis not present

## 2018-10-10 DIAGNOSIS — Z853 Personal history of malignant neoplasm of breast: Secondary | ICD-10-CM | POA: Diagnosis not present

## 2018-10-10 DIAGNOSIS — N2581 Secondary hyperparathyroidism of renal origin: Secondary | ICD-10-CM | POA: Diagnosis not present

## 2018-10-10 NOTE — Telephone Encounter (Signed)
Already had zofran. Cut down on pain meds and nausea will get better. Taking it around the clock. I discussed elevation, with renal failure she will get some serous drainage around the pins. Do pin tract care as instructed. Elevate foot more and decrease pain meds and nausea will be better. ROV as scheduled

## 2018-10-10 NOTE — Telephone Encounter (Signed)
Patient states she had surgery and the oxycodone she is taking is making her nauseous. She is wondering if she can be given anything for this? She uses [facility] in [place] # [phone]

## 2018-10-10 NOTE — Telephone Encounter (Signed)
I called and spoke with patient. She is already taking the Zofran disintegrating tablet with no relief. Per Dr. Lorin Mercy, she will need to cut back on her pain medication as this is all that he can give. Elevate. Patient advised.  Jim with [facility] PT also got onto the phone and states patient told him that she had drainage from the pins earlier. He does not notice appreciable drainage and states he only sees a little around the calcaneal pin. He will call back for further orders later.

## 2018-10-10 NOTE — Telephone Encounter (Signed)
Please advise 

## 2018-10-13 DIAGNOSIS — N186 End stage renal disease: Secondary | ICD-10-CM | POA: Diagnosis not present

## 2018-10-13 DIAGNOSIS — R112 Nausea with vomiting, unspecified: Secondary | ICD-10-CM | POA: Diagnosis not present

## 2018-10-13 DIAGNOSIS — D509 Iron deficiency anemia, unspecified: Secondary | ICD-10-CM | POA: Diagnosis not present

## 2018-10-13 DIAGNOSIS — N2581 Secondary hyperparathyroidism of renal origin: Secondary | ICD-10-CM | POA: Diagnosis not present

## 2018-10-13 DIAGNOSIS — Z992 Dependence on renal dialysis: Secondary | ICD-10-CM | POA: Diagnosis not present

## 2018-10-13 DIAGNOSIS — Z23 Encounter for immunization: Secondary | ICD-10-CM | POA: Diagnosis not present

## 2018-10-13 NOTE — Discharge Summary (Signed)
Patient ID: Levina Boyack MRN: [id number] DOB/AGE: 04/30/1971 47 y.o.  Admit date: 10/06/2018 Discharge date: 10/13/2018  Admission Diagnoses:  Active Problems:   Closed right ankle fracture   Closed displaced trimalleolar fracture of right ankle   Discharge Diagnoses:  Active Problems:   Closed right ankle fracture   Closed displaced trimalleolar fracture of right ankle  status post Procedure(s): OPEN REDUCTION INTERNAL FIXATION (ORIF) RIGHT ANKLE TRIMALLEOLAR  Past Medical History:  Diagnosis Date  . Anemia   . Ankle fracture   . Blood transfusion without reported diagnosis   . Cancer ([place])   . Diabetes mellitus without complication ([place])   . Dialysis patient ([place])    mon, wed, friday,   . GERD (gastroesophageal reflux disease)   . Hypertension   . OSA (obstructive sleep apnea)   . PONV (postoperative nausea and vomiting)   . Renal disorder   . Renal insufficiency   . Wears glasses     Surgeries: Procedure(s): OPEN REDUCTION INTERNAL FIXATION (ORIF) RIGHT ANKLE TRIMALLEOLAR on 10/06/2018   Consultants: Treatment Team:  Corliss Parish, MD Donato Heinz, MD  Discharged Condition: Improved  Hospital Course: Saydee Zolman is an 47 y.o. female who was admitted 10/06/2018 for operative treatment of ankle fracture. Patient failed conservative treatments (please see the history and physical for the specifics) and had severe unremitting pain that affects sleep, daily activities and work/hobbies. After pre-op clearance, the patient was taken to the operating room on 10/06/2018 and underwent  Procedure(s): OPEN REDUCTION INTERNAL FIXATION (ORIF) RIGHT ANKLE TRIMALLEOLAR.    Patient was given perioperative antibiotics:  Anti-infectives (From admission, onward)   Start     Dose/Rate Route Frequency Ordered Stop   10/07/18 1200  ceFAZolin (ANCEF) IVPB 1 g/50 mL premix     1 g 100 mL/hr over 30 Minutes Intravenous Every 24 hours 10/06/18 1748 10/07/18 1730   10/07/18 0600  ceFAZolin (ANCEF) IVPB 2g/100 mL premix     2 g 200 mL/hr over 30 Minutes Intravenous On call to O.R. 10/06/18 1225 10/06/18 1456   10/06/18 1243  ceFAZolin (ANCEF) 2-4 GM/100ML-% IVPB    Note to Pharmacy:  Gustavo Lah   : cabinet override      10/06/18 1243 10/06/18 1456       Patient was given sequential compression devices and early ambulation to prevent DVT.   Patient benefited maximally from hospital stay and there were no complications. At the time of discharge, the patient was urinating/moving their bowels without difficulty, tolerating a regular diet, pain is controlled with oral pain medications and they have been cleared by PT/OT.   Recent vital signs: No data found.   Recent laboratory studies: No results for input(s): WBC, HGB, HCT, PLT, NA, K, CL, CO2, BUN, CREATININE, GLUCOSE, INR, CALCIUM in the last 72 hours.  Invalid input(s): PT, 2   Discharge Medications:   Allergies as of 10/09/2018      Reactions   Reglan [metoclopramide] Other (See Comments)   hallucinations hallucinations   Amlodipine Besylate Rash, Other (See Comments)   dizziness      Medication List    STOP taking these medications   acetaminophen 500 MG tablet Commonly known as:  TYLENOL   oxyCODONE-acetaminophen 5-325 MG tablet Commonly known as:  PERCOCET/ROXICET Replaced by:  oxyCODONE-acetaminophen 7.5-325 MG tablet     TAKE these medications   albuterol (2.5 MG/3ML) 0.083% nebulizer solution Commonly known as:  PROVENTIL Take 3 mLs (2.5 mg total) by nebulization every 6 (six) hours as  needed for wheezing or shortness of breath.   albuterol 108 (90 Base) MCG/ACT inhaler Commonly known as:  PROVENTIL HFA;VENTOLIN HFA Inhale 2 puffs into the lungs every 6 (six) hours as needed for wheezing or shortness of breath.   AMITIZA 24 MCG capsule Generic drug:  lubiprostone TAKE 1 CAPSULE (24 MCG TOTAL) BY MOUTH 2 (TWO) TIMES DAILY WITH A MEAL. What changed:  See the new  instructions.   AURYXIA 1 GM 210 MG(Fe) tablet Generic drug:  ferric citrate Take 2 tablets by mouth 2 (two) times daily with a meal. With Breakfast & with supper   cinacalcet 30 MG tablet Commonly known as:  SENSIPAR Take 30 mg by mouth every evening.   fluticasone 50 MCG/ACT nasal spray Commonly known as:  FLONASE Place 2 sprays into both nostrils daily.   furosemide 40 MG tablet Commonly known as:  LASIX Take 40-80 mg by mouth 2 (two) times daily. Take 2 tablets (80mg ) in the morning and take 1 tablet (40mg ) at bedtime   glipiZIDE 5 MG tablet Commonly known as:  GLUCOTROL Take 5 mg by mouth 2 (two) times daily as needed. For blood sugar levels over 150   labetalol 100 MG tablet Commonly known as:  NORMODYNE Take 100 mg by mouth 2 (two) times daily.   multivitamin Tabs tablet Take 1 tablet by mouth daily.   NIFEdipine 60 MG 24 hr tablet Commonly known as:  ADALAT CC Take 1 tablet (60 mg total) by mouth daily. What changed:  when to take this   ondansetron 4 MG disintegrating tablet Commonly known as:  ZOFRAN-ODT Take 4 mg by mouth every 8 (eight) hours as needed for nausea or vomiting.   oxyCODONE-acetaminophen 7.5-325 MG tablet Commonly known as:  PERCOCET Take 1-2 tablets by mouth every 6 (six) hours as needed for severe pain. Replaces:  oxyCODONE-acetaminophen 5-325 MG tablet   pantoprazole 40 MG tablet Commonly known as:  PROTONIX Take 40 mg by mouth daily.   RENVELA 800 MG tablet Generic drug:  sevelamer carbonate Take 800 mg by mouth See admin instructions. Take 2 tablets (1600 mg) in the morning, take 3 tablets (2400 mg) with lunch or snack,  and take 2 tablets (1600 mg) by mouth in the evening with dinner meal.   rOPINIRole 2 MG 24 hr tablet Commonly known as:  REQUIP XL Take 2 mg by mouth at bedtime.       Diagnostic Studies: Dg Chest 2 View  Result Date: 10/06/2018 CLINICAL DATA:  Preop for RIGHT ankle surgery. History of hypertension,  diabetes, dialysis, breast cancer status post mastectomy last year. EXAM: CHEST - 2 VIEW COMPARISON:  Chest x-ray dated 02/24/2018. FINDINGS: Stable mild cardiomegaly. Lungs are clear. No pleural effusion. Surgical clips over the LEFT chest wall, compatible with the given history of mastectomy. No acute or suspicious osseous finding. IMPRESSION: No active cardiopulmonary disease. No evidence of pneumonia or pulmonary edema. Electronically Signed   By: Franki Cabot M.D.   On: 10/06/2018 13:48   Dg Ankle 2 Views Right  Result Date: 10/06/2018 CLINICAL DATA:  47 year old female with ankle fracture. Subsequent encounter. EXAM: DG C-ARM 61-120 MIN; RIGHT ANKLE - 2 VIEW Fluoroscopic time: 46 seconds. COMPARISON:  10/03/2018. FINDINGS: Six intraoperative C-arm views submitted for review after surgery. Fibular fracture reduced with sideplate and screws. Medial malleolar fracture treated with single screw (screw may traverse through portion of the medial malleolar fracture fragment which is slightly laterally located). Posterior malleolar fracture treated with single screw. Slight  widening ankle mortise. Last image reveals what appear to be pins for external fixation in the right tibial shaft. IMPRESSION: Open reduction and internal fixation right ankle fracture as noted above. Electronically Signed   By: Genia Del M.D.   On: 10/06/2018 17:01   Dg C-arm 1-60 Min  Result Date: 10/06/2018 CLINICAL DATA:  47 year old female with ankle fracture. Subsequent encounter. EXAM: DG C-ARM 61-120 MIN; RIGHT ANKLE - 2 VIEW Fluoroscopic time: 46 seconds. COMPARISON:  10/03/2018. FINDINGS: Six intraoperative C-arm views submitted for review after surgery. Fibular fracture reduced with sideplate and screws. Medial malleolar fracture treated with single screw (screw may traverse through portion of the medial malleolar fracture fragment which is slightly laterally located). Posterior malleolar fracture treated with single  screw. Slight widening ankle mortise. Last image reveals what appear to be pins for external fixation in the right tibial shaft. IMPRESSION: Open reduction and internal fixation right ankle fracture as noted above. Electronically Signed   By: Genia Del M.D.   On: 10/06/2018 17:01   Xr Ankle Complete Right  Result Date: 10/06/2018 Three-view x-rays right ankle obtained and reviewed.  This shows comminuted distal fibular fracture starting 1 cm above the level of the mortise.  There is comminuted medial malleolus fracture with 2 separate transverse fracture components.  Large posterior medial malleolar fracture which extends posterior medial.  Impaction of the lateral plafond of the distal tibia. Impression: Closed comminuted trimalleolar ankle fracture with extension into the lateral plafond     Follow-up Information    Marybelle Killings, MD Follow up in 1 week(s).   Specialty:  Orthopedic Surgery Contact information: [place] [address] [phone]        [facility] Follow up.   Specialty:  Home Health Services Why:  A reopresentative from [facility] will contact you to arrange start date and time for your therapy. Contact information: [address] [place] [address] [phone]           Discharge Plan:  discharge to home  Disposition:     Signed: Benjiman Core  10/13/2018, 12:04 PM

## 2018-10-14 ENCOUNTER — Ambulatory Visit (INDEPENDENT_AMBULATORY_CARE_PROVIDER_SITE_OTHER): Payer: Medicare Other

## 2018-10-14 ENCOUNTER — Other Ambulatory Visit: Payer: Self-pay

## 2018-10-14 ENCOUNTER — Encounter (INDEPENDENT_AMBULATORY_CARE_PROVIDER_SITE_OTHER): Payer: Self-pay | Admitting: Orthopaedic Surgery

## 2018-10-14 ENCOUNTER — Emergency Department (HOSPITAL_COMMUNITY)
Admission: EM | Admit: 2018-10-14 | Discharge: 2018-10-14 | Disposition: A | Discharge status: Home / Self Care | Payer: Medicare Other | Attending: Emergency Medicine | Admitting: Emergency Medicine

## 2018-10-14 ENCOUNTER — Ambulatory Visit (INDEPENDENT_AMBULATORY_CARE_PROVIDER_SITE_OTHER): Payer: Medicare Other | Admitting: Orthopaedic Surgery

## 2018-10-14 ENCOUNTER — Emergency Department (HOSPITAL_COMMUNITY): Payer: Medicare Other

## 2018-10-14 ENCOUNTER — Encounter (HOSPITAL_COMMUNITY): Payer: Self-pay

## 2018-10-14 VITALS — BP 148/92 | HR 93 | Ht 63.0 in | Wt 245.0 lb

## 2018-10-14 DIAGNOSIS — Z992 Dependence on renal dialysis: Secondary | ICD-10-CM | POA: Diagnosis not present

## 2018-10-14 DIAGNOSIS — Z859 Personal history of malignant neoplasm, unspecified: Secondary | ICD-10-CM | POA: Insufficient documentation

## 2018-10-14 DIAGNOSIS — E119 Type 2 diabetes mellitus without complications: Secondary | ICD-10-CM | POA: Insufficient documentation

## 2018-10-14 DIAGNOSIS — R112 Nausea with vomiting, unspecified: Secondary | ICD-10-CM | POA: Diagnosis not present

## 2018-10-14 DIAGNOSIS — I120 Hypertensive chronic kidney disease with stage 5 chronic kidney disease or end stage renal disease: Secondary | ICD-10-CM | POA: Insufficient documentation

## 2018-10-14 DIAGNOSIS — N186 End stage renal disease: Secondary | ICD-10-CM

## 2018-10-14 DIAGNOSIS — S82851A Displaced trimalleolar fracture of right lower leg, initial encounter for closed fracture: Secondary | ICD-10-CM

## 2018-10-14 DIAGNOSIS — K5900 Constipation, unspecified: Secondary | ICD-10-CM

## 2018-10-14 DIAGNOSIS — Z79899 Other long term (current) drug therapy: Secondary | ICD-10-CM | POA: Diagnosis not present

## 2018-10-14 LAB — CBC
HEMATOCRIT: 32 % — AB (ref 36.0–46.0)
Hemoglobin: 9.3 g/dL — ABNORMAL LOW (ref 12.0–15.0)
MCH: 27.4 pg (ref 26.0–34.0)
MCHC: 29.1 g/dL — AB (ref 30.0–36.0)
MCV: 94.1 fL (ref 80.0–100.0)
PLATELETS: 328 10*3/uL (ref 150–400)
RBC: 3.4 MIL/uL — ABNORMAL LOW (ref 3.87–5.11)
RDW: 14.4 % (ref 11.5–15.5)
WBC: 10.8 10*3/uL — AB (ref 4.0–10.5)
nRBC: 0 % (ref 0.0–0.2)

## 2018-10-14 LAB — COMPREHENSIVE METABOLIC PANEL
ALK PHOS: 108 U/L (ref 38–126)
ALT: 14 U/L (ref 0–44)
AST: 29 U/L (ref 15–41)
Albumin: 4.3 g/dL (ref 3.5–5.0)
Anion gap: 20 — ABNORMAL HIGH (ref 5–15)
BUN: 39 mg/dL — AB (ref 6–20)
CALCIUM: 10.2 mg/dL (ref 8.9–10.3)
CO2: 26 mmol/L (ref 22–32)
CREATININE: 10.47 mg/dL — AB (ref 0.44–1.00)
Chloride: 93 mmol/L — ABNORMAL LOW (ref 98–111)
GFR calc Af Amer: 4 mL/min — ABNORMAL LOW (ref >60)
GFR, EST NON AFRICAN AMERICAN: 4 mL/min — AB (ref >60)
Glucose, Bld: 102 mg/dL — ABNORMAL HIGH (ref 70–99)
Potassium: 3.7 mmol/L (ref 3.5–5.1)
Sodium: 139 mmol/L (ref 135–145)
TOTAL PROTEIN: 8.7 g/dL — AB (ref 6.5–8.1)
Total Bilirubin: 0.7 mg/dL (ref 0.3–1.2)

## 2018-10-14 LAB — LIPASE, BLOOD: Lipase: 58 U/L — ABNORMAL HIGH (ref 11–51)

## 2018-10-14 MED ORDER — PROMETHAZINE HCL 25 MG/ML IJ SOLN
12.5000 mg | Freq: Once | INTRAMUSCULAR | Status: AC
  Administered 2018-10-14: 12.5 mg via INTRAVENOUS
  Filled 2018-10-14: qty 1

## 2018-10-14 MED ORDER — SORBITOL 70 % SOLN
960.0000 mL | TOPICAL_OIL | Freq: Once | ORAL | Status: AC
  Administered 2018-10-14: 960 mL via RECTAL
  Filled 2018-10-14: qty 473

## 2018-10-14 MED ORDER — ONDANSETRON HCL 4 MG/2ML IJ SOLN
4.0000 mg | Freq: Once | INTRAMUSCULAR | Status: AC
  Administered 2018-10-14: 4 mg via INTRAVENOUS
  Filled 2018-10-14: qty 2

## 2018-10-14 MED ORDER — POLYETHYLENE GLYCOL 3350 17 G PO PACK: 17.0000 g | PACK | Freq: Every day | ORAL | 0 refills | Status: DC

## 2018-10-14 MED ORDER — LIDOCAINE VISCOUS HCL 2 % MT SOLN
15.0000 mL | Freq: Once | OROMUCOSAL | Status: AC
  Administered 2018-10-14: 15 mL via OROMUCOSAL
  Filled 2018-10-14: qty 15

## 2018-10-14 NOTE — ED Triage Notes (Signed)
Pt has been vomiting since Thursday. Broke anke l2 Wednesdays ago and had surgery on 8 days ago. Has not been able to have a BM. NAD

## 2018-10-14 NOTE — ED Notes (Signed)
PT given SMOG enema at this time and tolerated most of the enema and lying on her left side.

## 2018-10-14 NOTE — ED Notes (Signed)
1/2 SMOG given of Pt on commode, leg elevated.

## 2018-10-14 NOTE — ED Notes (Signed)
Pt had large BM. EDP notified, pt given water to drink

## 2018-10-14 NOTE — Progress Notes (Signed)
Post-Op Visit Note   Patient: Patricia [name]           Date of Birth: 19-Oct-1971           MRN: [id number] Visit Date: 10/14/2018 PCP: Jake Samples, PA-C   Assessment & Plan: Post ORIF right trimalleolar ankle fracture with external fixator application.  She had fracture blisters medially.  Today these were aspirated typical serous blister fluid no purulence.  She is had problems with nausea Zofran has not worked she had problems and had to have a suppository in the hospital with some result.  She likely has an impaction we discussed disimpaction I gave her some gloves she can do this at home.  She does have positive bowel sounds today.  She is had trouble keeping her medications down.  She still getting dialysis every other day.  If she does not get results with disimpaction she will go to the emergency room for evaluation for possible ileus.  Chief Complaint:  Chief Complaint  Patient presents with  . Right Ankle - Routine Post Op    10/06/18 ORIF Right Ankle Trimalleolar   Visit Diagnoses:  1. Closed displaced trimalleolar fracture of right ankle, initial encounter     Plan: She will go home and check to see if she needs to be disimpacted.  She can use a suppository.  That is not effective she will go to the emergency room for evaluation of ileus.  She states she vomited some dark liquid and cannot keep anything down currently.  She gets labs checked every other day at dialysis.  She does have bowel sounds but is not passing any flatus.  Minimal abdominal tenderness no rebound.  Recheck 1 week for possible staple removal.  Follow-Up Instructions: No follow-ups on file.   Orders:  Orders Placed This Encounter  Procedures  . XR Ankle 2 Views Right  . XR Tibia/Fibula Right   No orders of the defined types were placed in this encounter.   Imaging: Xr Ankle 2 Views Right  Result Date: 10/14/2018 Three-view x-rays right ankle demonstrate trimalleolar ankle fracture fixation.   External fixator is in place.  Comparison to fluoroscopic 10/06/2018 images show position is maintained. Impression: Post-ORIF trimalleolar ankle fracture.  Distalmost transverse medial wheeler fragment is medially displaced and not captured by the screw unchanged from previous images.   PMFS History: Patient Active Problem List   Diagnosis Date Noted  . Closed right ankle fracture 10/06/2018  . Closed displaced trimalleolar fracture of right ankle 10/06/2018  . Acute colitis 02/26/2018  . Hypoxemia 02/24/2018  . Dyspnea 11/26/2017  . Anemia 11/26/2017  . Elevated troponin 11/26/2017  . Hyperkalemia 09/29/2017  . Acute respiratory failure ([place]) 09/28/2017  . CAP (community acquired pneumonia) 09/26/2017  . Volume overload 09/25/2017  . Acute on chronic diastolic CHF (congestive heart failure) ([place]) 09/25/2017  . Lactic acidosis 09/25/2017  . Hypokalemia 09/25/2017  . Breast cancer ([place]) 07/22/2017  . SOB (shortness of breath)   . Acute respiratory failure with hypoxia ([place]) 07/21/2017  . Flatulence 10/25/2016  . GERD (gastroesophageal reflux disease) 08/02/2016  . Pyloric stenosis, acquired   . Nausea with vomiting   . Generalized abdominal pain   . Chest pain, rule out acute myocardial infarction 07/04/2016  . Constipation 07/04/2016  . Nausea 07/04/2016  . Essential hypertension 07/04/2016  . HCAP (healthcare-associated pneumonia) 12/04/2015  . End stage renal disease on dialysis ([place]) 12/04/2015  . Diabetes mellitus ([place]) 12/04/2015   Past Medical History:  Diagnosis Date  . Anemia   . Ankle fracture   . Blood transfusion without reported diagnosis   . Cancer ([place])   . Diabetes mellitus without complication ([place])   . Dialysis patient ([place])    mon, wed, friday,   . GERD (gastroesophageal reflux disease)   . Hypertension   . OSA (obstructive sleep apnea)   . PONV (postoperative nausea and vomiting)   . Renal disorder   . Renal insufficiency   . Wears glasses       Family History  Problem Relation Age of Onset  . Diabetes Mellitus II Mother   . Hypertension Mother   . Hypertension Sister   . Hypertension Sister   . Colon cancer Neg Hx     Past Surgical History:  Procedure Laterality Date  . ABDOMINAL HYSTERECTOMY    . AV FISTULA PLACEMENT  11/2014   at [facility] N/A 07/10/2016   Procedure: BALLOON DILATION;  Surgeon: Danie Binder, MD;  Location[facility];  Service: Endoscopy;  Laterality: N/A;  Pyloric dilation  . BREAST LUMPECTOMY    . CESAREAN SECTION    . CHOLECYSTECTOMY    . COLONOSCOPY WITH PROPOFOL N/A 09/27/2016   Dr. Gala Romney: Internal hemorrhoids repeat colonoscopy in 10 years  . DILATION AND CURETTAGE OF UTERUS    . ESOPHAGOGASTRODUODENOSCOPY N/A 07/10/2016   Dr.Fields- normal esophagus, gastric stenosis was found at the pylorus, gastritis on bx, normal examined duodenun  . MASTECTOMY     left sided  . ORIF ANKLE FRACTURE Right 10/06/2018   Procedure: OPEN REDUCTION INTERNAL FIXATION (ORIF) RIGHT ANKLE TRIMALLEOLAR;  Surgeon: Marybelle Killings, MD;  Location: [facility];  Service: Orthopedics;  Laterality: Right;   Social History   Occupational History  . Not on file  Tobacco Use  . Smoking status: Never Smoker  . Smokeless tobacco: Never Used  Substance and Sexual Activity  . Alcohol use: No    Alcohol/week: 0.0 standard drinks  . Drug use: No  . Sexual activity: Not on file

## 2018-10-14 NOTE — ED Provider Notes (Signed)
[facility] EMERGENCY DEPARTMENT Provider Note   CSN: [id number] Arrival date & time: 10/14/18  1240     History   Chief Complaint Chief Complaint  Patient presents with  . Emesis    HPI Patricia [name] is a 47 y.o. female.  Pt presents to the ED today with constipation and n/v.  The pt sustained a trimalleolar right ankle fx on 10/17 which required surgery on 10/21.  She was in the hospital until 10/28.  She has been constipated for 8 days.  She has tried stool softeners and suppositories without success.  She is a dialysis pt and has been compliant with MWF dialysis.  No sob.  She said she can't keep down any of her meds due to the n/v.  She has not taken the pain meds in a few days.     Past Medical History:  Diagnosis Date  . Anemia   . Ankle fracture   . Blood transfusion without reported diagnosis   . Cancer ([place])   . Diabetes mellitus without complication ([place])   . Dialysis patient ([place])    mon, wed, friday,   . GERD (gastroesophageal reflux disease)   . Hypertension   . OSA (obstructive sleep apnea)   . PONV (postoperative nausea and vomiting)   . Renal disorder   . Renal insufficiency   . Wears glasses     Patient Active Problem List   Diagnosis Date Noted  . Closed right ankle fracture 10/06/2018  . Closed displaced trimalleolar fracture of right ankle 10/06/2018  . Acute colitis 02/26/2018  . Hypoxemia 02/24/2018  . Dyspnea 11/26/2017  . Anemia 11/26/2017  . Elevated troponin 11/26/2017  . Hyperkalemia 09/29/2017  . Acute respiratory failure ([place]) 09/28/2017  . CAP (community acquired pneumonia) 09/26/2017  . Volume overload 09/25/2017  . Acute on chronic diastolic CHF (congestive heart failure) ([place]) 09/25/2017  . Lactic acidosis 09/25/2017  . Hypokalemia 09/25/2017  . Breast cancer ([place]) 07/22/2017  . SOB (shortness of breath)   . Acute respiratory failure with hypoxia ([place]) 07/21/2017  . Flatulence 10/25/2016  . GERD (gastroesophageal reflux  disease) 08/02/2016  . Pyloric stenosis, acquired   . Nausea with vomiting   . Generalized abdominal pain   . Chest pain, rule out acute myocardial infarction 07/04/2016  . Constipation 07/04/2016  . Nausea 07/04/2016  . Essential hypertension 07/04/2016  . HCAP (healthcare-associated pneumonia) 12/04/2015  . End stage renal disease on dialysis ([place]) 12/04/2015  . Diabetes mellitus ([place]) 12/04/2015    Past Surgical History:  Procedure Laterality Date  . ABDOMINAL HYSTERECTOMY    . AV FISTULA PLACEMENT  11/2014   at [place] N/A 07/10/2016   Procedure: BALLOON DILATION;  Surgeon: Danie Binder, MD;  Location: [facility];  Service: Endoscopy;  Laterality: N/A;  Pyloric dilation  . BREAST LUMPECTOMY    . CESAREAN SECTION    . CHOLECYSTECTOMY    . COLONOSCOPY WITH PROPOFOL N/A 09/27/2016   Dr. Gala Romney: Internal hemorrhoids repeat colonoscopy in 10 years  . DILATION AND CURETTAGE OF UTERUS    . ESOPHAGOGASTRODUODENOSCOPY N/A 07/10/2016   Dr.Fields- normal esophagus, gastric stenosis was found at the pylorus, gastritis on bx, normal examined duodenun  . MASTECTOMY     left sided  . ORIF ANKLE FRACTURE Right 10/06/2018   Procedure: OPEN REDUCTION INTERNAL FIXATION (ORIF) RIGHT ANKLE TRIMALLEOLAR;  Surgeon: Marybelle Killings, MD;  Location: [facility];  Service: Orthopedics;  Laterality: Right;  OB History    Gravida  3   Para  2   Term  1   Preterm  1   AB  1   Living        SAB  1   TAB      Ectopic      Multiple      Live Births               Home Medications    Prior to Admission medications   Medication Sig Start Date End Date Taking? Authorizing Provider  albuterol (PROVENTIL HFA;VENTOLIN HFA) 108 (90 Base) MCG/ACT inhaler Inhale 2 puffs into the lungs every 6 (six) hours as needed for wheezing or shortness of breath. 11/28/17  Yes Kathie Dike, MD  albuterol (PROVENTIL) (2.5 MG/3ML) 0.083% nebulizer solution Take 3 mLs (2.5 mg  total) by nebulization every 6 (six) hours as needed for wheezing or shortness of breath. 11/28/17  Yes Memon, Jolaine Artist, MD  AMITIZA 24 MCG capsule TAKE 1 CAPSULE (24 MCG TOTAL) BY MOUTH 2 (TWO) TIMES DAILY WITH A MEAL. Patient taking differently: Take 24 mcg by mouth 2 (two) times daily with a meal.  07/23/18  Yes Annitta Needs, NP  cinacalcet (SENSIPAR) 30 MG tablet Take 30 mg by mouth every evening.    Yes [provider]  Ferric Citrate (AURYXIA) 1 GM 210 MG(Fe) TABS Take 2 tablets by mouth 2 (two) times daily with a meal. With Breakfast & with supper   Yes [provider]  fluticasone (FLONASE) 50 MCG/ACT nasal spray Place 2 sprays into both nostrils daily as needed for allergies or rhinitis.  08/08/17  Yes [provider]  furosemide (LASIX) 40 MG tablet Take 40-80 mg by mouth 2 (two) times daily. Take 2 tablets (80mg ) in the morning and take 1 tablet (40mg ) at bedtime   Yes [provider]  glipiZIDE (GLUCOTROL) 5 MG tablet Take 5 mg by mouth 2 (two) times daily as needed. For blood sugar levels over 150   Yes [provider]  labetalol (NORMODYNE) 100 MG tablet Take 100 mg by mouth 2 (two) times daily. 08/24/18  Yes [provider]  multivitamin (RENA-VIT) TABS tablet Take 1 tablet by mouth daily.   Yes [provider]  NIFEdipine (PROCARDIA-XL/ADALAT CC) 60 MG 24 hr tablet Take 1 tablet (60 mg total) by mouth daily. Patient taking differently: Take 60 mg by mouth every evening.  02/28/18  Yes Kathie Dike, MD  ondansetron (ZOFRAN-ODT) 4 MG disintegrating tablet Take 4 mg by mouth every 8 (eight) hours as needed for nausea or vomiting.   Yes [provider]  oxyCODONE-acetaminophen (PERCOCET) 7.5-325 MG tablet Take 1-2 tablets by mouth every 6 (six) hours as needed for severe pain. 10/09/18  Yes Marybelle Killings, MD  pantoprazole (PROTONIX) 40 MG tablet Take 40 mg by mouth daily.   Yes [provider]  rOPINIRole  (REQUIP XL) 2 MG 24 hr tablet Take 2 mg by mouth at bedtime.  09/10/16  Yes [provider]  sevelamer carbonate (RENVELA) 800 MG tablet Take 800 mg by mouth See admin instructions. Take 2 tablets (1600 mg) in the morning, take 3 tablets (2400 mg) with lunch or snack,  and take 2 tablets (1600 mg) by mouth in the evening with dinner meal.   Yes [provider]  polyethylene glycol (MIRALAX) packet Take 17 g by mouth daily. 10/14/18   Isla Pence, MD    Family History Family History  Problem Relation Age of Onset  . Diabetes Mellitus II Mother   . Hypertension Mother   . Hypertension Sister   . Hypertension Sister   . Colon cancer Neg Hx     Social History Social History   Tobacco Use  . Smoking status: Never Smoker  . Smokeless tobacco: Never Used  Substance Use Topics  . Alcohol use: No    Alcohol/week: 0.0 standard drinks  . Drug use: No     Allergies   Reglan [metoclopramide] and Amlodipine besylate   Review of Systems Review of Systems  Gastrointestinal: Positive for abdominal pain, constipation, nausea and vomiting.  All other systems reviewed and are negative.    Physical Exam Updated Vital Signs BP (!) 144/86   Pulse 84   Temp 98.4 F (36.9 C) (Oral)   Resp 19   Ht 5\' 3"  (1.6 m)   Wt 111.6 kg   SpO2 97%   BMI 43.58 kg/m   Physical Exam  Constitutional: She is oriented to person, place, and time. She appears well-developed and well-nourished.  HENT:  Head: Normocephalic and atraumatic.  Right Ear: External ear normal.  Left Ear: External ear normal.  Nose: Nose normal.  Mouth/Throat: Oropharynx is clear and moist.  Eyes: Pupils are equal, round, and reactive to light. Conjunctivae and EOM are normal.  Neck: Normal range of motion. Neck supple.  Cardiovascular: Normal rate, regular rhythm, normal heart sounds and intact distal pulses.  Pulmonary/Chest: Effort normal and breath sounds normal.  Abdominal: Soft. Bowel sounds are  decreased.  Musculoskeletal:  Right ankle in an external fixator  Neurological: She is alert and oriented to person, place, and time.  Skin: Skin is warm. Capillary refill takes less than 2 seconds.  Psychiatric: She has a normal mood and affect. Her behavior is normal. Judgment and thought content normal.  Nursing note and vitals reviewed.    ED Treatments / Results  Labs (all labs ordered are listed, but only abnormal results are displayed) Labs Reviewed  LIPASE, BLOOD - Abnormal; Notable for the following components:      Result Value   Lipase 58 (*)    All other components within normal limits  COMPREHENSIVE METABOLIC PANEL - Abnormal; Notable for the following components:   Chloride 93 (*)    Glucose, Bld 102 (*)    BUN 39 (*)    Creatinine, Ser 10.47 (*)    Total Protein 8.7 (*)    GFR calc non Af Amer 4 (*)    GFR calc Af Amer 4 (*)    Anion gap 20 (*)    All other components within normal limits  CBC - Abnormal; Notable for the following components:   WBC 10.8 (*)    RBC 3.40 (*)    Hemoglobin 9.3 (*)    HCT 32.0 (*)    MCHC 29.1 (*)    All other components within normal limits  URINALYSIS, ROUTINE W REFLEX MICROSCOPIC    EKG None  Radiology Xr Ankle 2 Views Right  Result Date: 10/14/2018 Three-view x-rays right ankle demonstrate trimalleolar ankle fracture fixation.  External fixator is in place.  Comparison to fluoroscopic 10/06/2018 images show position is maintained. Impression: Post-ORIF trimalleolar ankle fracture.  Distalmost transverse medial wheeler fragment is medially displaced and not captured by the screw unchanged from previous images.  Xr Tibia/fibula Right  Result Date: 10/14/2018 [facility] lateral tib-fib x-rays demonstrate no pin loosening.  ORIF trimalleolar ankle fracture better visualized under ankle films. Impression: External fixator  across the ankle, right.    Procedures Procedures (including critical care time)  Medications Ordered in  ED Medications  sorbitol, milk of mag, mineral oil, glycerin (SMOG) enema (960 mLs Rectal Given 10/14/18 1615)  ondansetron (ZOFRAN) injection 4 mg (4 mg Intravenous Given 10/14/18 1605)  promethazine (PHENERGAN) injection 12.5 mg (12.5 mg Intravenous Given 10/14/18 1730)  lidocaine (XYLOCAINE) 2 % viscous mouth solution 15 mL (15 mLs Mouth/Throat Given 10/14/18 2038)     Initial Impression / Assessment and Plan / ED Course  I have reviewed the triage vital signs and the nursing notes.  Pertinent labs & imaging results that were available during my care of the patient were reviewed by me and considered in my medical decision making (see chart for details).    Pt had a large bm with SMOG enema.  She is able to tolerate po fluids.  She is instructed to go to dialysis tomorrow as scheduled.  Return if worse.    Final Clinical Impressions(s) / ED Diagnoses   Final diagnoses:  Constipation, unspecified constipation type  ESRD on hemodialysis ([place])  Non-intractable vomiting with nausea, unspecified vomiting type    ED Discharge Orders         Ordered    polyethylene glycol (MIRALAX) packet  Daily     10/14/18 2055           Isla Pence, MD 10/14/18 2056

## 2018-10-14 NOTE — ED Notes (Signed)
Pt back in bed, some BM passed patient complain of left leg hurting from stand, pt in bed on side will try to complete SMOG treatment, bedside commode at the bed

## 2018-10-15 DIAGNOSIS — D509 Iron deficiency anemia, unspecified: Secondary | ICD-10-CM | POA: Diagnosis not present

## 2018-10-15 DIAGNOSIS — Z23 Encounter for immunization: Secondary | ICD-10-CM | POA: Diagnosis not present

## 2018-10-15 DIAGNOSIS — N186 End stage renal disease: Secondary | ICD-10-CM | POA: Diagnosis not present

## 2018-10-15 DIAGNOSIS — Z992 Dependence on renal dialysis: Secondary | ICD-10-CM | POA: Diagnosis not present

## 2018-10-15 DIAGNOSIS — N2581 Secondary hyperparathyroidism of renal origin: Secondary | ICD-10-CM | POA: Diagnosis not present

## 2018-10-15 DIAGNOSIS — R112 Nausea with vomiting, unspecified: Secondary | ICD-10-CM | POA: Diagnosis not present

## 2018-10-16 ENCOUNTER — Telehealth (INDEPENDENT_AMBULATORY_CARE_PROVIDER_SITE_OTHER): Payer: Self-pay | Admitting: Orthopaedic Surgery

## 2018-10-16 ENCOUNTER — Other Ambulatory Visit: Payer: Self-pay | Admitting: Gastroenterology

## 2018-10-16 ENCOUNTER — Inpatient Hospital Stay (INDEPENDENT_AMBULATORY_CARE_PROVIDER_SITE_OTHER): Payer: Medicare Other | Admitting: Orthopaedic Surgery

## 2018-10-16 DIAGNOSIS — S82851D Displaced trimalleolar fracture of right lower leg, subsequent encounter for closed fracture with routine healing: Secondary | ICD-10-CM | POA: Diagnosis not present

## 2018-10-16 DIAGNOSIS — I132 Hypertensive heart and chronic kidney disease with heart failure and with stage 5 chronic kidney disease, or end stage renal disease: Secondary | ICD-10-CM | POA: Diagnosis not present

## 2018-10-16 DIAGNOSIS — I5032 Chronic diastolic (congestive) heart failure: Secondary | ICD-10-CM | POA: Diagnosis not present

## 2018-10-16 DIAGNOSIS — N186 End stage renal disease: Secondary | ICD-10-CM | POA: Diagnosis not present

## 2018-10-16 DIAGNOSIS — D649 Anemia, unspecified: Secondary | ICD-10-CM | POA: Diagnosis not present

## 2018-10-16 DIAGNOSIS — E1122 Type 2 diabetes mellitus with diabetic chronic kidney disease: Secondary | ICD-10-CM | POA: Diagnosis not present

## 2018-10-16 NOTE — Telephone Encounter (Signed)
Received call from Laverle Hobby (Nurse) from [facility] needing verbal orders for nursing,wound care and diabetic management 1wk 5. Larene Beach also asked for clarification for wound care. The number to contact Larene Beach is  [phone]

## 2018-10-16 NOTE — Telephone Encounter (Signed)
IC advised ok for orders.  For wound care advised keep pin tracks clean daily

## 2018-10-17 DIAGNOSIS — D631 Anemia in chronic kidney disease: Secondary | ICD-10-CM | POA: Diagnosis not present

## 2018-10-17 DIAGNOSIS — Z992 Dependence on renal dialysis: Secondary | ICD-10-CM | POA: Diagnosis not present

## 2018-10-17 DIAGNOSIS — N186 End stage renal disease: Secondary | ICD-10-CM | POA: Diagnosis not present

## 2018-10-17 DIAGNOSIS — D509 Iron deficiency anemia, unspecified: Secondary | ICD-10-CM | POA: Diagnosis not present

## 2018-10-17 DIAGNOSIS — N2581 Secondary hyperparathyroidism of renal origin: Secondary | ICD-10-CM | POA: Diagnosis not present

## 2018-10-20 ENCOUNTER — Telehealth: Payer: Self-pay | Admitting: Internal Medicine

## 2018-10-20 DIAGNOSIS — N186 End stage renal disease: Secondary | ICD-10-CM | POA: Diagnosis not present

## 2018-10-20 DIAGNOSIS — R112 Nausea with vomiting, unspecified: Secondary | ICD-10-CM

## 2018-10-20 DIAGNOSIS — N2581 Secondary hyperparathyroidism of renal origin: Secondary | ICD-10-CM | POA: Diagnosis not present

## 2018-10-20 DIAGNOSIS — D631 Anemia in chronic kidney disease: Secondary | ICD-10-CM | POA: Diagnosis not present

## 2018-10-20 DIAGNOSIS — D509 Iron deficiency anemia, unspecified: Secondary | ICD-10-CM | POA: Diagnosis not present

## 2018-10-20 DIAGNOSIS — Z992 Dependence on renal dialysis: Secondary | ICD-10-CM | POA: Diagnosis not present

## 2018-10-20 DIAGNOSIS — K5900 Constipation, unspecified: Secondary | ICD-10-CM

## 2018-10-20 NOTE — Telephone Encounter (Signed)
Spoke with pt. She is Taking Amitiza 8 mcg twice daily along with Miralax twice daily with no improvements in bowel movements. Pt went to the ED on last Tuesday due to vomiting that lasted several days. Pt states she feels like she going to have a bowel movement but it isn't coming all the way down. In the hospital, pt was given an enema and pt has done a couple of enemas in the life. The enemas help some but feels she didn't empty out fully. Pt also said her dialysis nurse said you can hear some movement within her stomach.

## 2018-10-20 NOTE — Telephone Encounter (Signed)
[phone] PLEASE CALL PATIENT, SHE IS HAVING BAD CONSTIPATION AND SHE IS SCHEDULED IN December

## 2018-10-21 ENCOUNTER — Ambulatory Visit (INDEPENDENT_AMBULATORY_CARE_PROVIDER_SITE_OTHER): Payer: Medicare Other | Admitting: Orthopaedic Surgery

## 2018-10-21 ENCOUNTER — Encounter (INDEPENDENT_AMBULATORY_CARE_PROVIDER_SITE_OTHER): Payer: Self-pay | Admitting: Orthopaedic Surgery

## 2018-10-21 VITALS — BP 155/89 | HR 80 | Ht 63.0 in | Wt 246.0 lb

## 2018-10-21 DIAGNOSIS — S82851D Displaced trimalleolar fracture of right lower leg, subsequent encounter for closed fracture with routine healing: Secondary | ICD-10-CM

## 2018-10-21 NOTE — Progress Notes (Signed)
Post-Op Visit Note   Patient: Patricia [name]           Date of Birth: 01-Aug-1971           MRN: [id number] Visit Date: 10/21/2018 PCP: Jake Samples, PA-C   Assessment & Plan: Dialysis patient returns post ORIF lateral malleolus and external fixator.  She had considerable swelling medial fracture blisters which have healed.  We will set her up for external fixator removal in 1 week and application of fiberglass short leg cast to manage the additional medial fracture which did not have internal fixation.  Chief Complaint:  Chief Complaint  Patient presents with  . Right Ankle - Follow-up   Visit Diagnoses:  1. Closed displaced trimalleolar fracture of right ankle with routine healing, subsequent encounter     Plan: We will set her up next week for external fixator removal, staple removal and short leg cast application.  She has some swelling as expected with a trimalleolar injury as well as since she is a dialysis patient I discussed with her we need to leave the staples in until next week.  Procedure discussed questions elicited and answered she will still be nonweightbearing once I put her in the fiberglass cast.  Follow-Up Instructions: No follow-ups on file.   Orders:  No orders of the defined types were placed in this encounter.  No orders of the defined types were placed in this encounter.   Imaging: No results found.  PMFS History: Patient Active Problem List   Diagnosis Date Noted  . Closed right ankle fracture 10/06/2018  . Closed displaced trimalleolar fracture of right ankle 10/06/2018  . Acute colitis 02/26/2018  . Hypoxemia 02/24/2018  . Dyspnea 11/26/2017  . Anemia 11/26/2017  . Elevated troponin 11/26/2017  . Hyperkalemia 09/29/2017  . Acute respiratory failure ([place]) 09/28/2017  . CAP (community acquired pneumonia) 09/26/2017  . Volume overload 09/25/2017  . Acute on chronic diastolic CHF (congestive heart failure) ([place]) 09/25/2017  . Lactic  acidosis 09/25/2017  . Hypokalemia 09/25/2017  . Breast cancer ([place]) 07/22/2017  . SOB (shortness of breath)   . Acute respiratory failure with hypoxia ([place]) 07/21/2017  . Flatulence 10/25/2016  . GERD (gastroesophageal reflux disease) 08/02/2016  . Pyloric stenosis, acquired   . Nausea with vomiting   . Generalized abdominal pain   . Chest pain, rule out acute myocardial infarction 07/04/2016  . Constipation 07/04/2016  . Nausea 07/04/2016  . Essential hypertension 07/04/2016  . HCAP (healthcare-associated pneumonia) 12/04/2015  . End stage renal disease on dialysis ([place]) 12/04/2015  . Diabetes mellitus ([place]) 12/04/2015   Past Medical History:  Diagnosis Date  . Anemia   . Ankle fracture   . Blood transfusion without reported diagnosis   . Cancer ([place])   . Diabetes mellitus without complication ([place])   . Dialysis patient ([employer])    mon, wed, friday,   . GERD (gastroesophageal reflux disease)   . Hypertension   . OSA (obstructive sleep apnea)   . PONV (postoperative nausea and vomiting)   . Renal disorder   . Renal insufficiency   . Wears glasses     Family History  Problem Relation Age of Onset  . Diabetes Mellitus II Mother   . Hypertension Mother   . Hypertension Sister   . Hypertension Sister   . Colon cancer Neg Hx     Past Surgical History:  Procedure Laterality Date  . ABDOMINAL HYSTERECTOMY    . AV FISTULA PLACEMENT  11/2014  at [place] N/A 07/10/2016   Procedure: BALLOON DILATION;  Surgeon: Danie Binder, MD;  [facility];  Service: Endoscopy;  Laterality: N/A;  Pyloric dilation  . BREAST LUMPECTOMY    . CESAREAN SECTION    . CHOLECYSTECTOMY    . COLONOSCOPY WITH PROPOFOL N/A 09/27/2016   Dr. Gala Romney: Internal hemorrhoids repeat colonoscopy in 10 years  . DILATION AND CURETTAGE OF UTERUS    . ESOPHAGOGASTRODUODENOSCOPY N/A 07/10/2016   Dr.Fields- normal esophagus, gastric stenosis was found at the pylorus, gastritis  on bx, normal examined duodenun  . MASTECTOMY     left sided  . ORIF ANKLE FRACTURE Right 10/06/2018   Procedure: OPEN REDUCTION INTERNAL FIXATION (ORIF) RIGHT ANKLE TRIMALLEOLAR;  Surgeon: Marybelle Killings, MD;  Location: [facility];  Service: Orthopedics;  Laterality: Right;   Social History   Occupational History  . Not on file  Tobacco Use  . Smoking status: Never Smoker  . Smokeless tobacco: Never Used  Substance and Sexual Activity  . Alcohol use: No    Alcohol/week: 0.0 standard drinks  . Drug use: No  . Sexual activity: Not on file

## 2018-10-22 DIAGNOSIS — N2581 Secondary hyperparathyroidism of renal origin: Secondary | ICD-10-CM | POA: Diagnosis not present

## 2018-10-22 DIAGNOSIS — D631 Anemia in chronic kidney disease: Secondary | ICD-10-CM | POA: Diagnosis not present

## 2018-10-22 DIAGNOSIS — Z992 Dependence on renal dialysis: Secondary | ICD-10-CM | POA: Diagnosis not present

## 2018-10-22 DIAGNOSIS — N186 End stage renal disease: Secondary | ICD-10-CM | POA: Diagnosis not present

## 2018-10-22 DIAGNOSIS — D509 Iron deficiency anemia, unspecified: Secondary | ICD-10-CM | POA: Diagnosis not present

## 2018-10-22 MED ORDER — LUBIPROSTONE 24 MCG PO CAPS: 24.0000 ug | ORAL_CAPSULE | Freq: Two times a day (BID) | ORAL | 3 refills | Status: DC

## 2018-10-22 NOTE — Telephone Encounter (Signed)
Noted. Pt notified and will having imagine done today.

## 2018-10-22 NOTE — Addendum Note (Signed)
Addended by: Mahala Menghini on: 10/22/2018 07:46 AM   Modules accepted: Orders

## 2018-10-22 NOTE — Telephone Encounter (Signed)
LSL correction. When I spoke with pt, she said she was doing Amitiza 8 mcg but when I told pt to start Amitiza 24 mcg, she states that she's already taking Amitiza 24 mcg and Miralax. Pt still vomits some but not as much since she did prior to getting the enema at the hospital. Pt states that the vomiting is usually after she has eaten something. She feels the bowel movement is there but not fully.

## 2018-10-22 NOTE — Telephone Encounter (Signed)
Please have patient increase to Amitiza 32mcg bid with food. Once BMs soft and more regular then stop miralax. RX sent to her pharmacy.  If she has recurrent vomiting, she needs to let us know. At that point would consider abd xray.   Call if no improvement in constipation.

## 2018-10-22 NOTE — Telephone Encounter (Signed)
Have patient go for DG abd, 2 view today.   Stop Amitiza.   If abd film ok and no evidence of ileus or obstruction we will try motegrity.

## 2018-10-22 NOTE — Addendum Note (Signed)
Addended by: Mahala Menghini on: 10/22/2018 09:44 AM   Modules accepted: Orders

## 2018-10-23 ENCOUNTER — Ambulatory Visit (HOSPITAL_COMMUNITY)
Admission: RE | Admit: 2018-10-23 | Discharge: 2018-10-23 | Disposition: A | Discharge status: Home / Self Care | Payer: Medicare Other | Source: Ambulatory Visit | Attending: Gastroenterology | Admitting: Gastroenterology

## 2018-10-23 DIAGNOSIS — R112 Nausea with vomiting, unspecified: Secondary | ICD-10-CM | POA: Diagnosis not present

## 2018-10-23 DIAGNOSIS — Z992 Dependence on renal dialysis: Secondary | ICD-10-CM | POA: Diagnosis not present

## 2018-10-23 DIAGNOSIS — K5900 Constipation, unspecified: Secondary | ICD-10-CM

## 2018-10-23 DIAGNOSIS — Z681 Body mass index (BMI) 19 or less, adult: Secondary | ICD-10-CM | POA: Diagnosis not present

## 2018-10-23 DIAGNOSIS — R195 Other fecal abnormalities: Secondary | ICD-10-CM | POA: Diagnosis not present

## 2018-10-23 DIAGNOSIS — D649 Anemia, unspecified: Secondary | ICD-10-CM | POA: Diagnosis not present

## 2018-10-23 DIAGNOSIS — S8261XD Displaced fracture of lateral malleolus of right fibula, subsequent encounter for closed fracture with routine healing: Secondary | ICD-10-CM | POA: Diagnosis not present

## 2018-10-23 DIAGNOSIS — I5032 Chronic diastolic (congestive) heart failure: Secondary | ICD-10-CM | POA: Diagnosis not present

## 2018-10-23 DIAGNOSIS — S82851D Displaced trimalleolar fracture of right lower leg, subsequent encounter for closed fracture with routine healing: Secondary | ICD-10-CM | POA: Diagnosis not present

## 2018-10-23 DIAGNOSIS — Z1389 Encounter for screening for other disorder: Secondary | ICD-10-CM | POA: Diagnosis not present

## 2018-10-23 DIAGNOSIS — I132 Hypertensive heart and chronic kidney disease with heart failure and with stage 5 chronic kidney disease, or end stage renal disease: Secondary | ICD-10-CM | POA: Diagnosis not present

## 2018-10-23 DIAGNOSIS — E1122 Type 2 diabetes mellitus with diabetic chronic kidney disease: Secondary | ICD-10-CM | POA: Diagnosis not present

## 2018-10-23 DIAGNOSIS — N186 End stage renal disease: Secondary | ICD-10-CM | POA: Diagnosis not present

## 2018-10-24 ENCOUNTER — Other Ambulatory Visit: Payer: Self-pay | Admitting: Gastroenterology

## 2018-10-24 DIAGNOSIS — Z992 Dependence on renal dialysis: Secondary | ICD-10-CM | POA: Diagnosis not present

## 2018-10-24 DIAGNOSIS — D631 Anemia in chronic kidney disease: Secondary | ICD-10-CM | POA: Diagnosis not present

## 2018-10-24 DIAGNOSIS — D509 Iron deficiency anemia, unspecified: Secondary | ICD-10-CM | POA: Diagnosis not present

## 2018-10-24 DIAGNOSIS — N2581 Secondary hyperparathyroidism of renal origin: Secondary | ICD-10-CM | POA: Diagnosis not present

## 2018-10-24 DIAGNOSIS — N186 End stage renal disease: Secondary | ICD-10-CM | POA: Diagnosis not present

## 2018-10-24 MED ORDER — LINACLOTIDE 290 MCG PO CAPS: 290.0000 ug | ORAL_CAPSULE | Freq: Every day | ORAL | 3 refills | Status: DC

## 2018-10-25 ENCOUNTER — Encounter (HOSPITAL_COMMUNITY): Payer: Self-pay | Admitting: *Deleted

## 2018-10-25 NOTE — Progress Notes (Signed)
Patient denies CP, Shob, or cardiology visit. States they are working on getting her dialysis adjusted for surgery. Provided the below instructions regarding DM.     How to Manage Your Diabetes Before and After Surgery  Why is it important to control my blood sugar before and after surgery? . Improving blood sugar levels before and after surgery helps healing and can limit problems. . A way of improving blood sugar control is eating a healthy diet by: o  Eating less sugar and carbohydrates o  Increasing activity/exercise o  Talking with your doctor about reaching your blood sugar goals . High blood sugars (greater than 180 mg/dL) can raise your risk of infections and slow your recovery, so you will need to focus on controlling your diabetes during the weeks before surgery. . Make sure that the doctor who takes care of your diabetes knows about your planned surgery including the date and location.  How do I manage my blood sugar before surgery? . Check your blood sugar at least 4 times a day, starting 2 days before surgery, to make sure that the level is not too high or low. o Check your blood sugar the morning of your surgery when you wake up and every 2 hours until you get to the Short Stay unit. . If your blood sugar is less than 70 mg/dL, you will need to treat for low blood sugar: o Do not take insulin. o Treat a low blood sugar (less than 70 mg/dL) with  cup of clear juice (cranberry or apple), 4 glucose tablets, OR glucose gel. Recheck blood sugar in 15 minutes after treatment (to make sure it is greater than 70 mg/dL). If your blood sugar is not greater than 70 mg/dL on recheck, call [phone] o  for further instructions. . Report your blood sugar to the short stay nurse when you get to Short Stay.  . If you are admitted to the hospital after surgery: o Your blood sugar will be checked by the staff and you will probably be given insulin after surgery (instead of oral diabetes  medicines) to make sure you have good blood sugar levels. o The goal for blood sugar control after surgery is 80-180 mg/dL.              WHAT DO I DO ABOUT MY DIABETES MEDICATION?   Marland Kitchen Do not take oral diabetes medicines (pills) the morning of surgery.  . THE NIGHT BEFORE SURGERY, take ___________ units of ___________insulin.       . THE MORNING OF SURGERY, take _____________ units of __________insulin.  . The day of surgery, do not take other diabetes injectables, including Byetta (exenatide), Bydureon (exenatide ER), Victoza (liraglutide), or Trulicity (dulaglutide).  . If your CBG is greater than 220 mg/dL, you may take  of your sliding scale (correction) dose of insulin.  Other Instructions:          Patient Signature:  Date:   Nurse Signature:  Date:   Reviewed and Endorsed by [facility] Patient Education Committee, August 2015

## 2018-10-27 DIAGNOSIS — N2581 Secondary hyperparathyroidism of renal origin: Secondary | ICD-10-CM | POA: Diagnosis not present

## 2018-10-27 DIAGNOSIS — D631 Anemia in chronic kidney disease: Secondary | ICD-10-CM | POA: Diagnosis not present

## 2018-10-27 DIAGNOSIS — Z992 Dependence on renal dialysis: Secondary | ICD-10-CM | POA: Diagnosis not present

## 2018-10-27 DIAGNOSIS — N186 End stage renal disease: Secondary | ICD-10-CM | POA: Diagnosis not present

## 2018-10-27 DIAGNOSIS — D509 Iron deficiency anemia, unspecified: Secondary | ICD-10-CM | POA: Diagnosis not present

## 2018-10-28 DIAGNOSIS — I5032 Chronic diastolic (congestive) heart failure: Secondary | ICD-10-CM | POA: Diagnosis not present

## 2018-10-28 DIAGNOSIS — D509 Iron deficiency anemia, unspecified: Secondary | ICD-10-CM | POA: Diagnosis not present

## 2018-10-28 DIAGNOSIS — E1122 Type 2 diabetes mellitus with diabetic chronic kidney disease: Secondary | ICD-10-CM | POA: Diagnosis not present

## 2018-10-28 DIAGNOSIS — I132 Hypertensive heart and chronic kidney disease with heart failure and with stage 5 chronic kidney disease, or end stage renal disease: Secondary | ICD-10-CM | POA: Diagnosis not present

## 2018-10-28 DIAGNOSIS — D631 Anemia in chronic kidney disease: Secondary | ICD-10-CM | POA: Diagnosis not present

## 2018-10-28 DIAGNOSIS — S82851D Displaced trimalleolar fracture of right lower leg, subsequent encounter for closed fracture with routine healing: Secondary | ICD-10-CM | POA: Diagnosis not present

## 2018-10-28 DIAGNOSIS — D649 Anemia, unspecified: Secondary | ICD-10-CM | POA: Diagnosis not present

## 2018-10-28 DIAGNOSIS — N186 End stage renal disease: Secondary | ICD-10-CM | POA: Diagnosis not present

## 2018-10-28 DIAGNOSIS — Z992 Dependence on renal dialysis: Secondary | ICD-10-CM | POA: Diagnosis not present

## 2018-10-28 DIAGNOSIS — N2581 Secondary hyperparathyroidism of renal origin: Secondary | ICD-10-CM | POA: Diagnosis not present

## 2018-10-29 ENCOUNTER — Ambulatory Visit (HOSPITAL_COMMUNITY): Payer: Medicare Other | Admitting: Certified Registered Nurse Anesthetist

## 2018-10-29 ENCOUNTER — Ambulatory Visit (HOSPITAL_COMMUNITY)
Admission: RE | Admit: 2018-10-29 | Discharge: 2018-10-29 | Disposition: A | Discharge status: Home / Self Care | Payer: Medicare Other | Source: Ambulatory Visit | Attending: Orthopaedic Surgery | Admitting: Orthopaedic Surgery

## 2018-10-29 ENCOUNTER — Encounter (HOSPITAL_COMMUNITY): Payer: Self-pay

## 2018-10-29 ENCOUNTER — Encounter (HOSPITAL_COMMUNITY)
Admission: RE | Disposition: A | Discharge status: Home / Self Care | Payer: Self-pay | Source: Ambulatory Visit | Attending: Orthopaedic Surgery

## 2018-10-29 ENCOUNTER — Other Ambulatory Visit: Payer: Self-pay

## 2018-10-29 DIAGNOSIS — S82851D Displaced trimalleolar fracture of right lower leg, subsequent encounter for closed fracture with routine healing: Secondary | ICD-10-CM | POA: Diagnosis not present

## 2018-10-29 DIAGNOSIS — K219 Gastro-esophageal reflux disease without esophagitis: Secondary | ICD-10-CM | POA: Diagnosis not present

## 2018-10-29 DIAGNOSIS — W19XXXD Unspecified fall, subsequent encounter: Secondary | ICD-10-CM | POA: Diagnosis not present

## 2018-10-29 DIAGNOSIS — Z853 Personal history of malignant neoplasm of breast: Secondary | ICD-10-CM | POA: Diagnosis not present

## 2018-10-29 DIAGNOSIS — Z6841 Body Mass Index (BMI) 40.0 and over, adult: Secondary | ICD-10-CM | POA: Insufficient documentation

## 2018-10-29 DIAGNOSIS — X58XXXD Exposure to other specified factors, subsequent encounter: Secondary | ICD-10-CM | POA: Insufficient documentation

## 2018-10-29 DIAGNOSIS — Z79899 Other long term (current) drug therapy: Secondary | ICD-10-CM | POA: Insufficient documentation

## 2018-10-29 DIAGNOSIS — Z992 Dependence on renal dialysis: Secondary | ICD-10-CM | POA: Insufficient documentation

## 2018-10-29 DIAGNOSIS — S82851A Displaced trimalleolar fracture of right lower leg, initial encounter for closed fracture: Secondary | ICD-10-CM | POA: Diagnosis present

## 2018-10-29 DIAGNOSIS — G4733 Obstructive sleep apnea (adult) (pediatric): Secondary | ICD-10-CM | POA: Diagnosis not present

## 2018-10-29 DIAGNOSIS — E1122 Type 2 diabetes mellitus with diabetic chronic kidney disease: Secondary | ICD-10-CM | POA: Diagnosis not present

## 2018-10-29 DIAGNOSIS — I132 Hypertensive heart and chronic kidney disease with heart failure and with stage 5 chronic kidney disease, or end stage renal disease: Secondary | ICD-10-CM | POA: Diagnosis not present

## 2018-10-29 DIAGNOSIS — S82891A Other fracture of right lower leg, initial encounter for closed fracture: Secondary | ICD-10-CM | POA: Diagnosis present

## 2018-10-29 DIAGNOSIS — J449 Chronic obstructive pulmonary disease, unspecified: Secondary | ICD-10-CM | POA: Diagnosis not present

## 2018-10-29 DIAGNOSIS — Z7984 Long term (current) use of oral hypoglycemic drugs: Secondary | ICD-10-CM | POA: Insufficient documentation

## 2018-10-29 DIAGNOSIS — M1990 Unspecified osteoarthritis, unspecified site: Secondary | ICD-10-CM | POA: Insufficient documentation

## 2018-10-29 DIAGNOSIS — I120 Hypertensive chronic kidney disease with stage 5 chronic kidney disease or end stage renal disease: Secondary | ICD-10-CM | POA: Diagnosis not present

## 2018-10-29 DIAGNOSIS — I5033 Acute on chronic diastolic (congestive) heart failure: Secondary | ICD-10-CM | POA: Diagnosis not present

## 2018-10-29 DIAGNOSIS — N186 End stage renal disease: Secondary | ICD-10-CM | POA: Diagnosis not present

## 2018-10-29 HISTORY — DX: End stage renal disease: N18.6

## 2018-10-29 HISTORY — PX: EXTERNAL FIXATION REMOVAL: SHX5040

## 2018-10-29 HISTORY — DX: Unspecified osteoarthritis, unspecified site: M19.90

## 2018-10-29 HISTORY — DX: Malignant neoplasm of unspecified site of unspecified female breast: C50.919

## 2018-10-29 HISTORY — DX: Pneumonia, unspecified organism: J18.9

## 2018-10-29 HISTORY — DX: End stage renal disease: Z99.2

## 2018-10-29 LAB — POCT I-STAT 4, (NA,K, GLUC, HGB,HCT)
Glucose, Bld: 244 mg/dL — ABNORMAL HIGH (ref 70–99)
HEMATOCRIT: 33 % — AB (ref 36.0–46.0)
Hemoglobin: 11.2 g/dL — ABNORMAL LOW (ref 12.0–15.0)
Potassium: 3.9 mmol/L (ref 3.5–5.1)
SODIUM: 138 mmol/L (ref 135–145)

## 2018-10-29 LAB — SURGICAL PCR SCREEN
MRSA, PCR: NEGATIVE
Staphylococcus aureus: NEGATIVE

## 2018-10-29 LAB — GLUCOSE, CAPILLARY
GLUCOSE-CAPILLARY: 236 mg/dL — AB (ref 70–99)
Glucose-Capillary: 172 mg/dL — ABNORMAL HIGH (ref 70–99)

## 2018-10-29 SURGERY — REMOVAL, EXTERNAL FIXATION DEVICE, LOWER EXTREMITY
Anesthesia: General | Site: Leg Lower | Laterality: Right

## 2018-10-29 MED ORDER — HYDROMORPHONE HCL 1 MG/ML IJ SOLN
0.2500 mg | INTRAMUSCULAR | Status: DC | PRN
  Administered 2018-10-29: 0.5 mg via INTRAVENOUS

## 2018-10-29 MED ORDER — SODIUM CHLORIDE 0.9 % IV SOLN
INTRAVENOUS | Status: DC
  Administered 2018-10-29: 11:00:00 via INTRAVENOUS

## 2018-10-29 MED ORDER — PROPOFOL 10 MG/ML IV BOLUS
INTRAVENOUS | Status: DC | PRN
  Administered 2018-10-29: 100 mg via INTRAVENOUS
  Administered 2018-10-29 (×2): 50 mg via INTRAVENOUS

## 2018-10-29 MED ORDER — DIPHENHYDRAMINE HCL 50 MG/ML IJ SOLN
INTRAMUSCULAR | Status: AC
  Filled 2018-10-29: qty 1

## 2018-10-29 MED ORDER — LIDOCAINE 2% (20 MG/ML) 5 ML SYRINGE
INTRAMUSCULAR | Status: AC
  Filled 2018-10-29: qty 5

## 2018-10-29 MED ORDER — LIDOCAINE 2% (20 MG/ML) 5 ML SYRINGE
INTRAMUSCULAR | Status: DC | PRN
  Administered 2018-10-29: 40 mg via INTRAVENOUS

## 2018-10-29 MED ORDER — MIDAZOLAM HCL 5 MG/5ML IJ SOLN
INTRAMUSCULAR | Status: DC | PRN
  Administered 2018-10-29: 2 mg via INTRAVENOUS

## 2018-10-29 MED ORDER — FENTANYL CITRATE (PF) 100 MCG/2ML IJ SOLN
INTRAMUSCULAR | Status: DC | PRN
  Administered 2018-10-29 (×5): 25 ug via INTRAVENOUS

## 2018-10-29 MED ORDER — MIDAZOLAM HCL 2 MG/2ML IJ SOLN: 0.5000 mg | Freq: Once | INTRAMUSCULAR | Status: DC | PRN

## 2018-10-29 MED ORDER — PROPOFOL 10 MG/ML IV BOLUS
INTRAVENOUS | Status: AC
  Filled 2018-10-29: qty 20

## 2018-10-29 MED ORDER — ONDANSETRON HCL 4 MG/2ML IJ SOLN
INTRAMUSCULAR | Status: DC | PRN
  Administered 2018-10-29: 4 mg via INTRAVENOUS

## 2018-10-29 MED ORDER — MEPERIDINE HCL 50 MG/ML IJ SOLN: 6.2500 mg | INTRAMUSCULAR | Status: DC | PRN

## 2018-10-29 MED ORDER — FENTANYL CITRATE (PF) 250 MCG/5ML IJ SOLN
INTRAMUSCULAR | Status: AC
  Filled 2018-10-29: qty 5

## 2018-10-29 MED ORDER — OXYCODONE-ACETAMINOPHEN 7.5-325 MG PO TABS: 1.0000 | ORAL_TABLET | Freq: Four times a day (QID) | ORAL | 0 refills | Status: DC | PRN

## 2018-10-29 MED ORDER — PROMETHAZINE HCL 25 MG/ML IJ SOLN: 6.2500 mg | INTRAMUSCULAR | Status: DC | PRN

## 2018-10-29 MED ORDER — HYDROMORPHONE HCL 1 MG/ML IJ SOLN
INTRAMUSCULAR | Status: AC
  Filled 2018-10-29: qty 1

## 2018-10-29 MED ORDER — SCOPOLAMINE 1 MG/3DAYS TD PT72
MEDICATED_PATCH | TRANSDERMAL | Status: DC | PRN
  Administered 2018-10-29: 1 via TRANSDERMAL

## 2018-10-29 MED ORDER — CEFAZOLIN SODIUM-DEXTROSE 2-4 GM/100ML-% IV SOLN
2.0000 g | INTRAVENOUS | Status: AC
  Administered 2018-10-29: 2 g via INTRAVENOUS

## 2018-10-29 MED ORDER — CEFAZOLIN SODIUM-DEXTROSE 2-4 GM/100ML-% IV SOLN
INTRAVENOUS | Status: AC
  Filled 2018-10-29: qty 100

## 2018-10-29 MED ORDER — CHLORHEXIDINE GLUCONATE 4 % EX LIQD: 60.0000 mL | Freq: Once | CUTANEOUS | Status: DC

## 2018-10-29 MED ORDER — ONDANSETRON HCL 4 MG/2ML IJ SOLN
INTRAMUSCULAR | Status: AC
  Filled 2018-10-29: qty 2

## 2018-10-29 MED ORDER — MIDAZOLAM HCL 2 MG/2ML IJ SOLN
INTRAMUSCULAR | Status: AC
  Filled 2018-10-29: qty 2

## 2018-10-29 SURGICAL SUPPLY — 43 items
BANDAGE ACE 4X5 VEL STRL LF (GAUZE/BANDAGES/DRESSINGS) IMPLANT
BANDAGE ACE 6X5 VEL STRL LF (GAUZE/BANDAGES/DRESSINGS) IMPLANT
BANDAGE ESMARK 6X9 LF (GAUZE/BANDAGES/DRESSINGS) IMPLANT
BENZOIN TINCTURE PRP APPL 2/3 (GAUZE/BANDAGES/DRESSINGS) ×2 IMPLANT
BNDG COHESIVE 6X5 TAN STRL LF (GAUZE/BANDAGES/DRESSINGS) ×2 IMPLANT
BNDG ESMARK 6X9 LF (GAUZE/BANDAGES/DRESSINGS)
BNDG GAUZE ELAST 4 BULKY (GAUZE/BANDAGES/DRESSINGS) ×4 IMPLANT
CLSR STERI-STRIP ANTIMIC 1/2X4 (GAUZE/BANDAGES/DRESSINGS) ×2 IMPLANT
COVER SURGICAL LIGHT HANDLE (MISCELLANEOUS) ×2 IMPLANT
COVER WAND RF STERILE (DRAPES) IMPLANT
DRAPE C-ARM 42X72 X-RAY (DRAPES) IMPLANT
DRAPE U-SHAPE 47X51 STRL (DRAPES) ×2 IMPLANT
DRSG ADAPTIC 3X8 NADH LF (GAUZE/BANDAGES/DRESSINGS) ×2 IMPLANT
ELECT REM PT RETURN 9FT ADLT (ELECTROSURGICAL) ×2
ELECTRODE REM PT RTRN 9FT ADLT (ELECTROSURGICAL) ×1 IMPLANT
GAUZE SPONGE 4X4 12PLY STRL (GAUZE/BANDAGES/DRESSINGS) ×2 IMPLANT
GLOVE BIOGEL PI IND STRL 8 (GLOVE) ×2 IMPLANT
GLOVE BIOGEL PI INDICATOR 8 (GLOVE) ×2
GLOVE ORTHO TXT STRL SZ7.5 (GLOVE) ×4 IMPLANT
GOWN STRL REUS W/ TWL LRG LVL3 (GOWN DISPOSABLE) ×1 IMPLANT
GOWN STRL REUS W/ TWL XL LVL3 (GOWN DISPOSABLE) ×1 IMPLANT
GOWN STRL REUS W/TWL 2XL LVL3 (GOWN DISPOSABLE) ×2 IMPLANT
GOWN STRL REUS W/TWL LRG LVL3 (GOWN DISPOSABLE) ×1
GOWN STRL REUS W/TWL XL LVL3 (GOWN DISPOSABLE) ×1
KIT BASIN OR (CUSTOM PROCEDURE TRAY) ×2 IMPLANT
KIT TURNOVER KIT B (KITS) ×2 IMPLANT
MANIFOLD NEPTUNE II (INSTRUMENTS) ×2 IMPLANT
NS IRRIG 1000ML POUR BTL (IV SOLUTION) ×2 IMPLANT
PACK ORTHO EXTREMITY (CUSTOM PROCEDURE TRAY) ×2 IMPLANT
PAD ARMBOARD 7.5X6 YLW CONV (MISCELLANEOUS) ×4 IMPLANT
PADDING CAST COTTON 6X4 STRL (CAST SUPPLIES) ×6 IMPLANT
PENCIL BUTTON HOLSTER BLD 10FT (ELECTRODE) IMPLANT
SPONGE LAP 18X18 X RAY DECT (DISPOSABLE) ×2 IMPLANT
STOCKINETTE IMPERVIOUS LG (DRAPES) IMPLANT
SUT ETHILON 3 0 PS 1 (SUTURE) IMPLANT
SUT VIC AB 0 CT1 27 (SUTURE)
SUT VIC AB 0 CT1 27XBRD ANBCTR (SUTURE) IMPLANT
SUT VIC AB 2-0 CT1 27 (SUTURE)
SUT VIC AB 2-0 CT1 TAPERPNT 27 (SUTURE) IMPLANT
TOWEL OR 17X24 6PK STRL BLUE (TOWEL DISPOSABLE) ×4 IMPLANT
TOWEL OR 17X26 10 PK STRL BLUE (TOWEL DISPOSABLE) ×2 IMPLANT
UNDERPAD 30X30 (UNDERPADS AND DIAPERS) ×2 IMPLANT
WATER STERILE IRR 1000ML POUR (IV SOLUTION) ×4 IMPLANT

## 2018-10-29 NOTE — Transfer of Care (Signed)
Immediate Anesthesia Transfer of Care Note  Patient: Patricia [name]  Procedure(s) Performed: REMOVAL RIGHT ANKLE BIOMET ZIMMER EXTERNAL FIXATOR, SHORT LEG CAST APPLICATION (Right Leg Lower)  Patient Location: PACU  Anesthesia Type:General  Level of Consciousness: awake, alert  and oriented  Airway & Oxygen Therapy: Patient Spontanous Breathing and Patient connected to face mask oxygen  Post-op Assessment: Report given to RN and Post -op Vital signs reviewed and stable  Post vital signs: Reviewed and stable  Last Vitals:  Vitals Value Taken Time  BP 134/79 10/29/2018  1:52 PM  Temp    Pulse 90 10/29/2018  1:57 PM  Resp 21 10/29/2018  1:57 PM  SpO2 95 % 10/29/2018  1:57 PM  Vitals shown include unvalidated device data.  Last Pain:  Vitals:   10/29/18 1022  TempSrc:   PainSc: 6       Patients Stated Pain Goal: 3 (65/99/35 7017)  Complications: No apparent anesthesia complications

## 2018-10-29 NOTE — Interval H&P Note (Signed)
History and Physical Interval Note:  10/29/2018 12:42 PM  Patricia [name]  has presented today for surgery, with the diagnosis of right ankle fraxture trimalleolar  The various methods of treatment have been discussed with the patient and family. After consideration of risks, benefits and other options for treatment, the patient has consented to  Procedure(s): REMOVAL RIGHT ANKLE BIOMET ZIMMER EXTERNAL FIXATOR, SHORT LEG CAST APPLICATION (Right) as a surgical intervention .  The patient's history has been reviewed, patient examined, no change in status, stable for surgery.  I have reviewed the patient's chart and labs.  Questions were answered to the patient's satisfaction.     Patricia [name]

## 2018-10-29 NOTE — Progress Notes (Signed)
Verbal order from Dr. Glennon Mac that only an I-stat 4 is needed for this procedure today.

## 2018-10-29 NOTE — Discharge Instructions (Addendum)
-  strict nonweightbearing right foot.  -no driving  -do not get cast wet.    -elevate foot above heart level as much as possible to decrease pain and swelling.

## 2018-10-29 NOTE — Anesthesia Procedure Notes (Signed)
Procedure Name: LMA Insertion Date/Time: 10/29/2018 1:00 PM Performed by: Alain Marion, CRNA Pre-anesthesia Checklist: Patient identified, Emergency Drugs available, Suction available and Patient being monitored Patient Re-evaluated:Patient Re-evaluated prior to induction Oxygen Delivery Method: Circle System Utilized Preoxygenation: Pre-oxygenation with 100% oxygen Induction Type: IV induction Ventilation: Mask ventilation without difficulty LMA: LMA inserted LMA Size: 4.0 Number of attempts: 1 Airway Equipment and Method: Bite block Placement Confirmation: positive ETCO2 Tube secured with: Tape Dental Injury: Teeth and Oropharynx as per pre-operative assessment

## 2018-10-29 NOTE — Op Note (Addendum)
Preop diagnosis: Right trimalleolar ankle fracture post fixation with medial fracture blisters and external fixator provided for provisional stabilization due to soft tissue swelling and fracture blister formation.  Postop diagnosis same  Procedure: Removal of external fixator, short leg fiberglass cast application under anesthesia.  Surgeon:Tolulope Pinkett MD  Assistant Benjiman Core, PA-C present for the entire procedure, ext fix removal and casting  Anesthesia : general LMA.  Brief history 47 year old female dialysis patient fell with a trimalleolar ankle fracture.  Time of surgery lateral plate fixation was performed to the fibula and lag screw from anterior posterior for the posterior malleolus and the comminuted medial malleolus was only able be fixed with one single screw due to fracture blister swelling medially and skin tightness.  One single lag screw was placed medially and then patient was placed in external fixator for stabilization and is continued to have outpatient dialysis.  She now returns for external fixator removal now that swelling is down blister formation medially and it has resolved.  Procedure after induction general anesthesia with LMA tube placement external fixator was loosened pin tracks were scrubbed.  External fixator was cut with a large bolt cutter and then calcaneal pin was backed out after it also been scrubbed.  Staple removal was performed from tiny incision medially and anterolateral for lagging of the medial lateral malleolar fractures.  Lateral fibular incision had staples removed tincture benzoin Steri-Strips were applied.  After scrubbing Xeroform was placed over the pin tracts.  All pin tract site looks good.  Stockinette and short leg cast was then applied.  Patient tolerated procedure well transfer the care of room stable condition.

## 2018-10-29 NOTE — H&P (Signed)
Patricia [name] is an 47 y.o. female.   Chief Complaint: Right trimalleolar ankle fracture with external fixator.  Post medial lateral malleolus fixation and fracture blisters. HPI: 47 year old female renal failure patient on dialysis with above closed injury with fracture blisters.  Patient had surgery 10/06/2018 and was placed on external fixator due to significant soft tissue swelling and inability to perform complete fixation of her fracture with soft bone.  Blisters have resolved and now she returns for external fixator removal and cast application.  Past Medical History:  Diagnosis Date  . Anemia   . Ankle fracture   . Arthritis   . Blood transfusion without reported diagnosis   . Breast cancer ([place])   . Cancer ([place])   . Diabetes mellitus without complication ([place])   . Dialysis patient ([place])    mon, wed, friday,   . End stage renal disease on dialysis [facility])    M/W/F [facility] in [place]  . GERD (gastroesophageal reflux disease)   . Hypertension   . OSA (obstructive sleep apnea)    uses CPAP sometimes  . Pneumonia   . PONV (postoperative nausea and vomiting)   . Wears glasses     Past Surgical History:  Procedure Laterality Date  . ABDOMINAL HYSTERECTOMY    . AV FISTULA PLACEMENT  11/2014   at [facility] N/A 07/10/2016   Procedure: BALLOON DILATION;  Surgeon: Danie Binder, MD;  [facility];  Service: Endoscopy;  Laterality: N/A;  Pyloric dilation  . BREAST LUMPECTOMY    . CESAREAN SECTION    . CHOLECYSTECTOMY    . COLONOSCOPY WITH PROPOFOL N/A 09/27/2016   Dr. Gala Romney: Internal hemorrhoids repeat colonoscopy in 10 years  . DILATION AND CURETTAGE OF UTERUS    . ESOPHAGOGASTRODUODENOSCOPY N/A 07/10/2016   Dr.Fields- normal esophagus, gastric stenosis was found at the pylorus, gastritis on bx, normal examined duodenun  . MASTECTOMY     left sided  . ORIF ANKLE FRACTURE Right 10/06/2018   Procedure: OPEN REDUCTION INTERNAL FIXATION  (ORIF) RIGHT ANKLE TRIMALLEOLAR;  Surgeon: Marybelle Killings, MD;  Location: [place];  Service: Orthopedics;  Laterality: Right;    Family History  Problem Relation Age of Onset  . Diabetes Mellitus II Mother   . Hypertension Mother   . Hypertension Sister   . Hypertension Sister   . Colon cancer Neg Hx    Social History:  reports that she has never smoked. She has never used smokeless tobacco. She reports that she does not drink alcohol or use drugs.  Allergies:  Allergies  Allergen Reactions  . Amlodipine Besylate Rash and Other (See Comments)    dizziness  . Reglan [Metoclopramide] Other (See Comments)    hallucinations     Medications Prior to Admission  Medication Sig Dispense Refill  . cinacalcet (SENSIPAR) 30 MG tablet Take 30 mg by mouth every evening.     . Ferric Citrate (AURYXIA) 1 GM 210 MG(Fe) TABS Take 2 tablets by mouth 2 (two) times daily with a meal. With Breakfast & with supper    . fluticasone (FLONASE) 50 MCG/ACT nasal spray Place 2 sprays into both nostrils daily as needed for allergies or rhinitis.     . furosemide (LASIX) 40 MG tablet Take 40-80 mg by mouth 2 (two) times daily. Take 2 tablets (80mg ) in the morning and take 1 tablet (40mg ) at bedtime    . glipiZIDE (GLUCOTROL) 5 MG tablet Take 5 mg by mouth 2 (two)  times daily as needed. For blood sugar levels over 150    . labetalol (NORMODYNE) 100 MG tablet Take 100 mg by mouth 2 (two) times daily.  5  . linaclotide (LINZESS) 290 MCG CAPS capsule Take 1 capsule (290 mcg total) by mouth daily before breakfast. 30 capsule 3  . multivitamin (RENA-VIT) TABS tablet Take 1 tablet by mouth daily.    Marland Kitchen NIFEdipine (PROCARDIA-XL/ADALAT CC) 60 MG 24 hr tablet Take 1 tablet (60 mg total) by mouth daily. (Patient taking differently: Take 60 mg by mouth every evening. ) 60 tablet 0  . ondansetron (ZOFRAN-ODT) 4 MG disintegrating tablet Take 4 mg by mouth every 8 (eight) hours as needed for nausea or vomiting.    Marland Kitchen  oxyCODONE-acetaminophen (PERCOCET) 7.5-325 MG tablet Take 1-2 tablets by mouth every 6 (six) hours as needed for severe pain. 40 tablet 0  . pantoprazole (PROTONIX) 40 MG tablet Take 40 mg by mouth daily.    . polyethylene glycol (MIRALAX) packet Take 17 g by mouth daily. 14 each 0  . rOPINIRole (REQUIP XL) 2 MG 24 hr tablet Take 2 mg by mouth at bedtime.     . sevelamer carbonate (RENVELA) 800 MG tablet Take 800 mg by mouth See admin instructions. Take 2 tablets (1600 mg) in the morning, take 3 tablets (2400 mg) with lunch or snack,  and take 2 tablets (1600 mg) by mouth in the evening with dinner meal.    . albuterol (PROVENTIL HFA;VENTOLIN HFA) 108 (90 Base) MCG/ACT inhaler Inhale 2 puffs into the lungs every 6 (six) hours as needed for wheezing or shortness of breath. 1 Inhaler 2  . albuterol (PROVENTIL) (2.5 MG/3ML) 0.083% nebulizer solution Take 3 mLs (2.5 mg total) by nebulization every 6 (six) hours as needed for wheezing or shortness of breath. 75 mL 12    Results for orders placed or performed during the hospital encounter of 10/29/18 (from the past 48 hour(s))  Glucose, capillary     Status: Abnormal   Collection Time: 10/29/18 10:07 AM  Result Value Ref Range   Glucose-Capillary 236 (H) 70 - 99 mg/dL  I-STAT 4, (NA,K, GLUC, HGB,HCT)     Status: Abnormal   Collection Time: 10/29/18 10:21 AM  Result Value Ref Range   Sodium 138 135 - 145 mmol/L   Potassium 3.9 3.5 - 5.1 mmol/L   Glucose, Bld 244 (H) 70 - 99 mg/dL   HCT 33.0 (L) 36.0 - 46.0 %   Hemoglobin 11.2 (L) 12.0 - 15.0 g/dL  Surgical pcr screen     Status: None   Collection Time: 10/29/18 10:38 AM  Result Value Ref Range   MRSA, PCR NEGATIVE NEGATIVE   Staphylococcus aureus NEGATIVE NEGATIVE    Comment: (NOTE) The Xpert SA Assay (FDA approved for NASAL specimens in patients 47 years of age and older), is one component of a comprehensive surveillance program. It is not intended to diagnose infection nor to guide or  monitor treatment. Performed at [facility], [place] [address] [place], [place] [address]    No results found.  ROS 14 point review of systems updated positive for type 2 diabetes on oral medication renal failure on chronic dialysis, history of GERD, pyloric stenosis [place] acute respiratory failure with hypoxia March 2019.  Chronic diastolic heart failure related to renal disease, hypertension obesity BMI 43.  No current dyspnea no current chest pain.  Blood pressure (!) 198/98, pulse (!) 107, temperature 98.6 F (37 C), temperature source Oral, resp.  rate 20, height 5\' 3"  (1.6 m), weight 111.6 kg, SpO2 96 %. Physical Exam  Constitutional: She is oriented to person, place, and time. She appears well-developed and well-nourished.  HENT:  Head: Normocephalic.  Eyes: Pupils are equal, round, and reactive to light.  Neck: Normal range of motion.  Cardiovascular: Normal rate.  Respiratory: Effort normal.  GI: Soft.  Musculoskeletal:  Right lower extremity external fixator from tibia to calcaneus.  Pin tracks look good.  Medial ankle blisters has healed with eschar present.  Neurological: She is alert and oriented to person, place, and time.  Skin: Skin is warm and dry.  Psychiatric: She has a normal mood and affect. Her behavior is normal.     Assessment/Plan Plan external fixator removal right ankle and application of short leg fiberglass cast.  She will be nonweightbearing for a few more weeks.  Marybelle Killings, MD 10/29/2018, 12:36 PM

## 2018-10-29 NOTE — Anesthesia Preprocedure Evaluation (Addendum)
Anesthesia Evaluation  Patient identified by MRN, date of birth, ID band Patient awake    Reviewed: Allergy & Precautions, NPO status , Patient's Chart, lab work & pertinent test results  History of Anesthesia Complications (+) PONV  Airway Mallampati: II  TM Distance: >3 FB Neck ROM: Full    Dental  (+) Dental Advisory Given, Poor Dentition, Missing, Chipped   Pulmonary sleep apnea and Continuous Positive Airway Pressure Ventilation , COPD (last inhaler needed a year ago),  COPD inhaler,    breath sounds clear to auscultation       Cardiovascular hypertension, Pt. on medications (-) angina Rhythm:Regular Rate:Normal  '18 ECHO: EF 60-65%, valves OK   Neuro/Psych negative neurological ROS     GI/Hepatic Neg liver ROS, GERD  Medicated and Controlled,  Endo/Other  diabetes (glu 236), Oral Hypoglycemic AgentsMorbid obesity  Renal/GU Dialysis and ESRFRenal disease (K+ 3.9)     Musculoskeletal   Abdominal (+) + obese,   Peds  Hematology negative hematology ROS (+)   Anesthesia Other Findings Breast cancer  Reproductive/Obstetrics S/p hysterectomy                            Anesthesia Physical Anesthesia Plan  ASA: III  Anesthesia Plan: General   Post-op Pain Management:    Induction: Intravenous  PONV Risk Score and Plan: 3 and Ondansetron and Dexamethasone  Airway Management Planned: LMA  Additional Equipment:   Intra-op Plan:   Post-operative Plan:   Informed Consent: I have reviewed the patients History and Physical, chart, labs and discussed the procedure including the risks, benefits and alternatives for the proposed anesthesia with the patient or authorized representative who has indicated his/her understanding and acceptance.   Dental advisory given  Plan Discussed with: CRNA and Surgeon  Anesthesia Plan Comments: (Plan routine monitors, GA- LMA OK)         Anesthesia Quick Evaluation

## 2018-10-29 NOTE — Anesthesia Postprocedure Evaluation (Signed)
Anesthesia Post Note  Patient: Patricia [name]  Procedure(s) Performed: REMOVAL RIGHT ANKLE BIOMET ZIMMER EXTERNAL FIXATOR, SHORT LEG CAST APPLICATION (Right Leg Lower)     Patient location during evaluation: PACU Anesthesia Type: General Level of consciousness: awake and alert, oriented and patient cooperative Pain management: pain level controlled Vital Signs Assessment: post-procedure vital signs reviewed and stable Respiratory status: spontaneous breathing, nonlabored ventilation and respiratory function stable Cardiovascular status: blood pressure returned to baseline and stable Postop Assessment: no apparent nausea or vomiting Anesthetic complications: no    Last Vitals:  Vitals:   10/29/18 1437 10/29/18 1452  BP: (!) 156/92 (!) 155/84  Pulse: 86 88  Resp: (!) 21 (!) 24  Temp:    SpO2: 95% (!) 89%    Last Pain:  Vitals:   10/29/18 1352  TempSrc:   PainSc: 6                  Tabia Landowski,E. Vance Belcourt

## 2018-10-30 ENCOUNTER — Encounter (HOSPITAL_COMMUNITY): Payer: Self-pay | Admitting: Orthopaedic Surgery

## 2018-10-31 DIAGNOSIS — N2581 Secondary hyperparathyroidism of renal origin: Secondary | ICD-10-CM | POA: Diagnosis not present

## 2018-10-31 DIAGNOSIS — D649 Anemia, unspecified: Secondary | ICD-10-CM | POA: Diagnosis not present

## 2018-10-31 DIAGNOSIS — D631 Anemia in chronic kidney disease: Secondary | ICD-10-CM | POA: Diagnosis not present

## 2018-10-31 DIAGNOSIS — E1122 Type 2 diabetes mellitus with diabetic chronic kidney disease: Secondary | ICD-10-CM | POA: Diagnosis not present

## 2018-10-31 DIAGNOSIS — Z992 Dependence on renal dialysis: Secondary | ICD-10-CM | POA: Diagnosis not present

## 2018-10-31 DIAGNOSIS — N186 End stage renal disease: Secondary | ICD-10-CM | POA: Diagnosis not present

## 2018-10-31 DIAGNOSIS — D509 Iron deficiency anemia, unspecified: Secondary | ICD-10-CM | POA: Diagnosis not present

## 2018-10-31 DIAGNOSIS — I5032 Chronic diastolic (congestive) heart failure: Secondary | ICD-10-CM | POA: Diagnosis not present

## 2018-10-31 DIAGNOSIS — S82851D Displaced trimalleolar fracture of right lower leg, subsequent encounter for closed fracture with routine healing: Secondary | ICD-10-CM | POA: Diagnosis not present

## 2018-10-31 DIAGNOSIS — I132 Hypertensive heart and chronic kidney disease with heart failure and with stage 5 chronic kidney disease, or end stage renal disease: Secondary | ICD-10-CM | POA: Diagnosis not present

## 2018-11-03 DIAGNOSIS — D631 Anemia in chronic kidney disease: Secondary | ICD-10-CM | POA: Diagnosis not present

## 2018-11-03 DIAGNOSIS — N2581 Secondary hyperparathyroidism of renal origin: Secondary | ICD-10-CM | POA: Diagnosis not present

## 2018-11-03 DIAGNOSIS — N186 End stage renal disease: Secondary | ICD-10-CM | POA: Diagnosis not present

## 2018-11-05 DIAGNOSIS — N2581 Secondary hyperparathyroidism of renal origin: Secondary | ICD-10-CM | POA: Diagnosis not present

## 2018-11-05 DIAGNOSIS — D631 Anemia in chronic kidney disease: Secondary | ICD-10-CM | POA: Diagnosis not present

## 2018-11-05 DIAGNOSIS — N186 End stage renal disease: Secondary | ICD-10-CM | POA: Diagnosis not present

## 2018-11-07 ENCOUNTER — Inpatient Hospital Stay (INDEPENDENT_AMBULATORY_CARE_PROVIDER_SITE_OTHER): Payer: Medicare Other | Admitting: Orthopaedic Surgery

## 2018-11-07 DIAGNOSIS — D631 Anemia in chronic kidney disease: Secondary | ICD-10-CM | POA: Diagnosis not present

## 2018-11-07 DIAGNOSIS — N2581 Secondary hyperparathyroidism of renal origin: Secondary | ICD-10-CM | POA: Diagnosis not present

## 2018-11-07 DIAGNOSIS — N186 End stage renal disease: Secondary | ICD-10-CM | POA: Diagnosis not present

## 2018-11-10 DIAGNOSIS — N2581 Secondary hyperparathyroidism of renal origin: Secondary | ICD-10-CM | POA: Diagnosis not present

## 2018-11-10 DIAGNOSIS — D509 Iron deficiency anemia, unspecified: Secondary | ICD-10-CM | POA: Diagnosis not present

## 2018-11-10 DIAGNOSIS — Z992 Dependence on renal dialysis: Secondary | ICD-10-CM | POA: Diagnosis not present

## 2018-11-10 DIAGNOSIS — D631 Anemia in chronic kidney disease: Secondary | ICD-10-CM | POA: Diagnosis not present

## 2018-11-10 DIAGNOSIS — N186 End stage renal disease: Secondary | ICD-10-CM | POA: Diagnosis not present

## 2018-11-11 DIAGNOSIS — D649 Anemia, unspecified: Secondary | ICD-10-CM | POA: Diagnosis not present

## 2018-11-11 DIAGNOSIS — E1122 Type 2 diabetes mellitus with diabetic chronic kidney disease: Secondary | ICD-10-CM | POA: Diagnosis not present

## 2018-11-11 DIAGNOSIS — I5032 Chronic diastolic (congestive) heart failure: Secondary | ICD-10-CM | POA: Diagnosis not present

## 2018-11-11 DIAGNOSIS — I132 Hypertensive heart and chronic kidney disease with heart failure and with stage 5 chronic kidney disease, or end stage renal disease: Secondary | ICD-10-CM | POA: Diagnosis not present

## 2018-11-11 DIAGNOSIS — S82851D Displaced trimalleolar fracture of right lower leg, subsequent encounter for closed fracture with routine healing: Secondary | ICD-10-CM | POA: Diagnosis not present

## 2018-11-11 DIAGNOSIS — N186 End stage renal disease: Secondary | ICD-10-CM | POA: Diagnosis not present

## 2018-11-12 DIAGNOSIS — N2581 Secondary hyperparathyroidism of renal origin: Secondary | ICD-10-CM | POA: Diagnosis not present

## 2018-11-12 DIAGNOSIS — D631 Anemia in chronic kidney disease: Secondary | ICD-10-CM | POA: Diagnosis not present

## 2018-11-12 DIAGNOSIS — Z992 Dependence on renal dialysis: Secondary | ICD-10-CM | POA: Diagnosis not present

## 2018-11-12 DIAGNOSIS — N186 End stage renal disease: Secondary | ICD-10-CM | POA: Diagnosis not present

## 2018-11-14 DIAGNOSIS — N2581 Secondary hyperparathyroidism of renal origin: Secondary | ICD-10-CM | POA: Diagnosis not present

## 2018-11-14 DIAGNOSIS — D631 Anemia in chronic kidney disease: Secondary | ICD-10-CM | POA: Diagnosis not present

## 2018-11-14 DIAGNOSIS — Z992 Dependence on renal dialysis: Secondary | ICD-10-CM | POA: Diagnosis not present

## 2018-11-14 DIAGNOSIS — N186 End stage renal disease: Secondary | ICD-10-CM | POA: Diagnosis not present

## 2018-11-17 ENCOUNTER — Ambulatory Visit (INDEPENDENT_AMBULATORY_CARE_PROVIDER_SITE_OTHER): Payer: Medicare Other | Admitting: Nurse Practitioner

## 2018-11-17 ENCOUNTER — Encounter: Payer: Self-pay | Admitting: Nurse Practitioner

## 2018-11-17 VITALS — BP 153/95 | HR 106 | Temp 97.5°F | Ht 63.0 in

## 2018-11-17 DIAGNOSIS — K219 Gastro-esophageal reflux disease without esophagitis: Secondary | ICD-10-CM | POA: Diagnosis not present

## 2018-11-17 DIAGNOSIS — K5909 Other constipation: Secondary | ICD-10-CM | POA: Diagnosis not present

## 2018-11-17 DIAGNOSIS — N2581 Secondary hyperparathyroidism of renal origin: Secondary | ICD-10-CM | POA: Diagnosis not present

## 2018-11-17 DIAGNOSIS — D631 Anemia in chronic kidney disease: Secondary | ICD-10-CM | POA: Diagnosis not present

## 2018-11-17 DIAGNOSIS — R112 Nausea with vomiting, unspecified: Secondary | ICD-10-CM

## 2018-11-17 DIAGNOSIS — D509 Iron deficiency anemia, unspecified: Secondary | ICD-10-CM | POA: Diagnosis not present

## 2018-11-17 DIAGNOSIS — N186 End stage renal disease: Secondary | ICD-10-CM | POA: Diagnosis not present

## 2018-11-17 DIAGNOSIS — Z992 Dependence on renal dialysis: Secondary | ICD-10-CM | POA: Diagnosis not present

## 2018-11-17 NOTE — Progress Notes (Signed)
cc'ed to pcp °

## 2018-11-17 NOTE — Progress Notes (Signed)
Referring Provider: Jake Samples, PA* Primary Care Physician:  Jake Samples, PA-C Primary GI:  Dr. Gala Romney  Chief Complaint  Patient presents with  . Constipation    Linzess 290 not really helping    HPI:   Patricia [name] is a 47 y.o. female who presents for follow-up on constipation.  Patient was last seen in our office 04/03/2018 for acute colitis.  This is a hospital follow-up.  She was hospitalized in March with acute respiratory failure with hypoxia, vomiting, abdominal pain, diarrhea.  CT showed colitis involving the transverse/descending/rectosigmoid colon.  She was empirically given Cipro and Flagyl without stool studies.  Recommended GI follow-up as outpatient.  Historically noted history of constipation.  Colonoscopy up-to-date in October 2017 which only found hemorrhoids.  Had been doing well on Amitiza.  At one point she was also on lactulose.  Since being out of the hospital she stopped Amitiza but then redeveloped constipation but if she takes Amitiza even once daily she has diarrhea 4-5 times a day.  Stools not consistent with her chronic history.  Recommended reducing Amitiza to 8 mcg 1-2 times a day, if stools become loose recommended stool studies.  Requested progress report in 2 weeks.  The patient called our office 10/20/2018 noted persistent constipation.  She was taking MiraLAX as well as Amitiza 8 mcg twice daily.  She was seen in the emergency department and given an enema.  She is also given herself enemas which is helped somewhat but noted incomplete emptying.  Recommended she re-increase her Amitiza 24 mcg twice daily with food.  Continue MiraLAX until stools normalize.  Notify us of any vomiting.  Call if no improvement.  When informed recommendations she changed her story stating she was already taking Amitiza 24 mcg.  Still with vomiting but not as much as to prior to getting her enema.  Recommended to view abdominal x-ray, stop Amitiza.  The patient  completed her x-ray 4 days later which showed no obstructive bowel gas pattern, no free air, small volume of retained stool.  The patient was sent a my chart message with the results and recommended start Linzess 290 mcg once daily, hold for diarrhea.  Prescription was sent to her pharmacy.  Stop Amitiza, likely would not bleed MiraLAX.  Keep follow-up appointment and call if any worsening symptoms before then.  Today she states she's doing ok overall. Had an accident with a broken lower leg, in a cast. Previously she could have bowel movements with MiraLAX; since her fall and pain medication, this stopped working. Was given Colace after surgery. Had few, small bowel movements. She has had intermittnet nausea, rare vomiting. Has sensation of need to have a bowel movement. Was seen in the ER and given an emea with a bowel movement. Not taking pain medication currently, last dose about the first week of November. Used preparation H prior to her abdominal XRay which allowed her to have a bowel movement. Denies abdominal pain, fever, chills, unintentional weight loss. GERD doing well overall on PPI. Denies chest pain, dyspnea, dizziness, lightheadedness, syncope, near syncope. Denies any other upper or lower GI symptoms.  Past Medical History:  Diagnosis Date  . Anemia   . Ankle fracture   . Arthritis   . Blood transfusion without reported diagnosis   . Breast cancer ([place])   . Cancer ([place])   . Diabetes mellitus without complication ([place])   . Dialysis patient ([place])    mon, wed, friday,   .  End stage renal disease on dialysis [facility])    M/W/F [facility] in [place]  . GERD (gastroesophageal reflux disease)   . Hypertension   . OSA (obstructive sleep apnea)    uses CPAP sometimes  . Pneumonia   . PONV (postoperative nausea and vomiting)   . Wears glasses     Past Surgical History:  Procedure Laterality Date  . ABDOMINAL HYSTERECTOMY    . AV FISTULA PLACEMENT  11/2014   at [place] N/A 07/10/2016   Procedure: BALLOON DILATION;  Surgeon: Danie Binder, MD;  [facility];  Service: Endoscopy;  Laterality: N/A;  Pyloric dilation  . BREAST LUMPECTOMY    . CESAREAN SECTION    . CHOLECYSTECTOMY    . COLONOSCOPY WITH PROPOFOL N/A 09/27/2016   Dr. Gala Romney: Internal hemorrhoids repeat colonoscopy in 10 years  . DILATION AND CURETTAGE OF UTERUS    . ESOPHAGOGASTRODUODENOSCOPY N/A 07/10/2016   Dr.Fields- normal esophagus, gastric stenosis was found at the pylorus, gastritis on bx, normal examined duodenun  . EXTERNAL FIXATION REMOVAL Right 10/29/2018   Procedure: REMOVAL RIGHT ANKLE BIOMET ZIMMER EXTERNAL FIXATOR, SHORT LEG CAST APPLICATION;  Surgeon: Marybelle Killings, MD;  Location: [place];  Service: Orthopedics;  Laterality: Right;  . MASTECTOMY     left sided  . ORIF ANKLE FRACTURE Right 10/06/2018   Procedure: OPEN REDUCTION INTERNAL FIXATION (ORIF) RIGHT ANKLE TRIMALLEOLAR;  Surgeon: Marybelle Killings, MD;  Location: [place];  Service: Orthopedics;  Laterality: Right;    Current Outpatient Medications  Medication Sig Dispense Refill  . albuterol (PROVENTIL HFA;VENTOLIN HFA) 108 (90 Base) MCG/ACT inhaler Inhale 2 puffs into the lungs every 6 (six) hours as needed for wheezing or shortness of breath. 1 Inhaler 2  . albuterol (PROVENTIL) (2.5 MG/3ML) 0.083% nebulizer solution Take 3 mLs (2.5 mg total) by nebulization every 6 (six) hours as needed for wheezing or shortness of breath. 75 mL 12  . cinacalcet (SENSIPAR) 30 MG tablet Take 30 mg by mouth every evening.     . Ferric Citrate (AURYXIA) 1 GM 210 MG(Fe) TABS Take 2 tablets by mouth 2 (two) times daily with a meal. With Breakfast & with supper    . fluticasone (FLONASE) 50 MCG/ACT nasal spray Place 2 sprays into both nostrils daily as needed for allergies or rhinitis.     . furosemide (LASIX) 40 MG tablet Take 40-80 mg by mouth 2 (two) times daily. Take 2 tablets (80mg ) in the morning and take 1 tablet (40mg ) at  bedtime    . glipiZIDE (GLUCOTROL) 5 MG tablet Take 5 mg by mouth 2 (two) times daily as needed. For blood sugar levels over 150    . linaclotide (LINZESS) 290 MCG CAPS capsule Take 1 capsule (290 mcg total) by mouth daily before breakfast. 30 capsule 3  . multivitamin (RENA-VIT) TABS tablet Take 1 tablet by mouth daily.    Marland Kitchen NIFEdipine (PROCARDIA-XL/ADALAT CC) 60 MG 24 hr tablet Take 1 tablet (60 mg total) by mouth daily. (Patient taking differently: Take 60 mg by mouth every evening. ) 60 tablet 0  . ondansetron (ZOFRAN-ODT) 4 MG disintegrating tablet Take 4 mg by mouth every 8 (eight) hours as needed for nausea or vomiting.    Marland Kitchen oxyCODONE-acetaminophen (PERCOCET) 7.5-325 MG tablet Take 1 tablet by mouth every 6 (six) hours as needed for severe pain. 30 tablet 0  . pantoprazole (PROTONIX) 40 MG tablet Take 40 mg by mouth daily.    Marland Kitchen  rOPINIRole (REQUIP XL) 2 MG 24 hr tablet Take 2 mg by mouth at bedtime.     . sevelamer carbonate (RENVELA) 800 MG tablet Take 800 mg by mouth See admin instructions. Take 2 tablets (1600 mg) in the morning, take 3 tablets (2400 mg) with lunch or snack,  and take 2 tablets (1600 mg) by mouth in the evening with dinner meal.    . polyethylene glycol (MIRALAX) packet Take 17 g by mouth daily. (Patient not taking: Reported on 11/17/2018) 14 each 0   No current facility-administered medications for this visit.     Allergies as of 11/17/2018 - Review Complete 11/17/2018  Allergen Reaction Noted  . Amlodipine besylate Rash and Other (See Comments) 12/04/2015  . Reglan [metoclopramide] Other (See Comments) 12/04/2015    Family History  Problem Relation Age of Onset  . Diabetes Mellitus II Mother   . Hypertension Mother   . Hypertension Sister   . Hypertension Sister   . Colon cancer Neg Hx     Social History   Socioeconomic History  . Marital status: Widowed    Spouse name: Not on file  . Number of children: Not on file  . Years of education: Not on file  .  Highest education level: Not on file  Occupational History  . Not on file  Social Needs  . Financial resource strain: Not on file  . Food insecurity:    Worry: Not on file    Inability: Not on file  . Transportation needs:    Medical: Not on file    Non-medical: Not on file  Tobacco Use  . Smoking status: Never Smoker  . Smokeless tobacco: Never Used  Substance and Sexual Activity  . Alcohol use: No    Alcohol/week: 0.0 standard drinks  . Drug use: No  . Sexual activity: Not on file  Lifestyle  . Physical activity:    Days per week: Not on file    Minutes per session: Not on file  . Stress: Not on file  Relationships  . Social connections:    Talks on phone: Not on file    Gets together: Not on file    Attends religious service: Not on file    Active member of club or organization: Not on file    Attends meetings of clubs or organizations: Not on file    Relationship status: Not on file  Other Topics Concern  . Not on file  Social History Narrative  . Not on file    Review of Systems: General: Negative for anorexia, weight loss, fever, chills, fatigue, weakness. ENT: Negative for hoarseness, difficulty swallowing. CV: Negative for chest pain, angina, palpitations, peripheral edema.  Respiratory: Negative for dyspnea at rest, cough, sputum, wheezing.  GI: See history of present illness. MS: Right lower leg fracture s/p surgical repair.  Endo: Negative for unusual weight change.  Heme: Negative for bruising or bleeding.   Physical Exam: BP (!) 153/95   Pulse (!) 106   Temp (!) 97.5 F (36.4 C) (Oral)   Ht 5\' 3"  (1.6 m)   BMI 43.58 kg/m  General:   Alert and oriented. Pleasant and cooperative. Well-nourished and well-developed.  Eyes:  Without icterus, sclera clear and conjunctiva pink.  Ears:  Normal auditory acuity. Cardiovascular:  S1, S2 present without murmurs appreciated. Extremities without clubbing or edema. Respiratory:  Clear to auscultation  bilaterally. No wheezes, rales, or rhonchi. No distress.  Gastrointestinal:  +BS, soft, non-tender and non-distended. No HSM noted.  No guarding or rebound. No masses appreciated.  Rectal:  Deferred  Musculoskalatal:  Symmetrical without gross deformities. Skin:  Intact without significant lesions or rashes. Neurologic:  Alert and oriented x4;  grossly normal neurologically. Psych:  Alert and cooperative. Normal mood and affect. Heme/Lymph/Immune: No excessive bruising noted.    11/17/2018 11:36 AM   Disclaimer: This note was dictated with voice recognition software. Similar sounding words can inadvertently be transcribed and may not be corrected upon review.

## 2018-11-17 NOTE — Assessment & Plan Note (Signed)
Intermittent nausea and vomiting with abdominal swelling when she is very constipated.  This is likely related to her constipation which is exacerbated with her recent fracture and surgery.  Further recommendations below.  Follow-up in 2 months.

## 2018-11-17 NOTE — Patient Instructions (Signed)
1. I am giving you samples of Motegrity 2 mg.  Take this once a day. 2. As we discussed, if you do not have a good bowel movement in 3 days start taking MiraLAX 1-2 times a day as needed for good bowel movements. 3. Call us in 1 week and let us know how you are doing overall. 4. Stop taking Linzess. 5. Return for follow-up in 2 months. 6. Call us if you have any questions or concerns.  At [facility] we value your feedback. You may receive a survey about your visit today. Please share your experience as we strive to create trusting relationships with our patients to provide genuine, compassionate, quality care.  We appreciate your understanding and patience as we review any laboratory studies, imaging, and other diagnostic tests that are ordered as we care for you. Our office policy is 5 business days for review of these results, and any emergent or urgent results are addressed in a timely manner for your best interest. If you do not hear from our office in 1 week, please contact us.   We also encourage the use of MyChart, which contains your medical information for your review as well. If you are not enrolled in this feature, an access code is on this after visit summary for your convenience. Thank you for allowing [place] to be involved in your care.  It was great to see you today!  I hope you have a Merry Christmas!!

## 2018-11-17 NOTE — Assessment & Plan Note (Signed)
The patient has baseline constipation and was previously well managed on a regimen.  However, she began having worsening constipation after she had an injury, right lower leg/ankle fracture status post surgical repair.  She is currently in a wheelchair and not very ambulatory.  Linzess has not helped.  Recommend she stop this.  Start Motegrity samples for a week.  If no improvement recommend adding MiraLAX 1-2 times a day.  Call with a progress report in 1 week.  Her constipation is likely worsening due to short course of pain medication as well as limited mobility.  I also reeducated her on the need for adequate fiber, including fiber supplement if needed, as well as water intake.  Follow-up in 2 months.

## 2018-11-17 NOTE — Assessment & Plan Note (Signed)
GERD currently seems to be well managed on PPI.  Recommend she continue her current medications and follow-up in 2 months.

## 2018-11-18 ENCOUNTER — Inpatient Hospital Stay (INDEPENDENT_AMBULATORY_CARE_PROVIDER_SITE_OTHER): Payer: Medicare Other | Admitting: Orthopaedic Surgery

## 2018-11-18 DIAGNOSIS — Z888 Allergy status to other drugs, medicaments and biological substances status: Secondary | ICD-10-CM | POA: Diagnosis not present

## 2018-11-18 DIAGNOSIS — Z7984 Long term (current) use of oral hypoglycemic drugs: Secondary | ICD-10-CM | POA: Diagnosis not present

## 2018-11-18 DIAGNOSIS — M79622 Pain in left upper arm: Secondary | ICD-10-CM | POA: Diagnosis not present

## 2018-11-18 DIAGNOSIS — N186 End stage renal disease: Secondary | ICD-10-CM | POA: Diagnosis not present

## 2018-11-18 DIAGNOSIS — I120 Hypertensive chronic kidney disease with stage 5 chronic kidney disease or end stage renal disease: Secondary | ICD-10-CM | POA: Diagnosis not present

## 2018-11-18 DIAGNOSIS — G8918 Other acute postprocedural pain: Secondary | ICD-10-CM | POA: Diagnosis not present

## 2018-11-18 DIAGNOSIS — G4730 Sleep apnea, unspecified: Secondary | ICD-10-CM | POA: Diagnosis not present

## 2018-11-18 DIAGNOSIS — T82898A Other specified complication of vascular prosthetic devices, implants and grafts, initial encounter: Secondary | ICD-10-CM | POA: Diagnosis not present

## 2018-11-18 DIAGNOSIS — Z992 Dependence on renal dialysis: Secondary | ICD-10-CM | POA: Diagnosis not present

## 2018-11-18 DIAGNOSIS — Z6841 Body Mass Index (BMI) 40.0 and over, adult: Secondary | ICD-10-CM | POA: Diagnosis not present

## 2018-11-18 DIAGNOSIS — E1142 Type 2 diabetes mellitus with diabetic polyneuropathy: Secondary | ICD-10-CM | POA: Diagnosis not present

## 2018-11-18 DIAGNOSIS — Z79899 Other long term (current) drug therapy: Secondary | ICD-10-CM | POA: Diagnosis not present

## 2018-11-18 DIAGNOSIS — K219 Gastro-esophageal reflux disease without esophagitis: Secondary | ICD-10-CM | POA: Diagnosis not present

## 2018-11-18 DIAGNOSIS — E1122 Type 2 diabetes mellitus with diabetic chronic kidney disease: Secondary | ICD-10-CM | POA: Diagnosis not present

## 2018-11-19 DIAGNOSIS — D509 Iron deficiency anemia, unspecified: Secondary | ICD-10-CM | POA: Diagnosis not present

## 2018-11-19 DIAGNOSIS — N186 End stage renal disease: Secondary | ICD-10-CM | POA: Diagnosis not present

## 2018-11-19 DIAGNOSIS — N2581 Secondary hyperparathyroidism of renal origin: Secondary | ICD-10-CM | POA: Diagnosis not present

## 2018-11-19 DIAGNOSIS — Z992 Dependence on renal dialysis: Secondary | ICD-10-CM | POA: Diagnosis not present

## 2018-11-19 DIAGNOSIS — D631 Anemia in chronic kidney disease: Secondary | ICD-10-CM | POA: Diagnosis not present

## 2018-11-21 ENCOUNTER — Ambulatory Visit (INDEPENDENT_AMBULATORY_CARE_PROVIDER_SITE_OTHER): Payer: Medicare Other | Admitting: Orthopaedic Surgery

## 2018-11-21 ENCOUNTER — Encounter (INDEPENDENT_AMBULATORY_CARE_PROVIDER_SITE_OTHER): Payer: Self-pay | Admitting: Orthopaedic Surgery

## 2018-11-21 ENCOUNTER — Ambulatory Visit (INDEPENDENT_AMBULATORY_CARE_PROVIDER_SITE_OTHER): Payer: Medicare Other

## 2018-11-21 VITALS — Ht 63.0 in | Wt 246.0 lb

## 2018-11-21 DIAGNOSIS — N2581 Secondary hyperparathyroidism of renal origin: Secondary | ICD-10-CM | POA: Diagnosis not present

## 2018-11-21 DIAGNOSIS — Z992 Dependence on renal dialysis: Secondary | ICD-10-CM | POA: Diagnosis not present

## 2018-11-21 DIAGNOSIS — S82851D Displaced trimalleolar fracture of right lower leg, subsequent encounter for closed fracture with routine healing: Secondary | ICD-10-CM | POA: Diagnosis not present

## 2018-11-21 DIAGNOSIS — N186 End stage renal disease: Secondary | ICD-10-CM | POA: Diagnosis not present

## 2018-11-21 DIAGNOSIS — D509 Iron deficiency anemia, unspecified: Secondary | ICD-10-CM | POA: Diagnosis not present

## 2018-11-21 DIAGNOSIS — D631 Anemia in chronic kidney disease: Secondary | ICD-10-CM | POA: Diagnosis not present

## 2018-11-21 NOTE — Progress Notes (Signed)
Post-Op Visit Note   Patient: Patricia [name]           Date of Birth: [birth year]           MRN: [id number] Visit Date: 11/21/2018 PCP: Jake Samples, PA-C   Assessment & Plan: Post-ORIF trimalleolar ankle fracture.  No evidence of infection she has dark area from the medial foot  fracture blister that will take some time to appear back to normal skin.  She will apply soap water and lotion.  All incisions look good.  She is still nonweightbearing.  I will check her back again in 4 weeks.  Repeat x-rays on return and we should be able to allow her to start weightbearing at that time.  Chief Complaint:  Chief Complaint  Patient presents with  . Right Ankle - Routine Post Op    10/29/18 Removal of ex-fix, short leg cast application Right  65/78/46 ORIF Right Trimalleolar Fracture   Visit Diagnoses:  1. Closed displaced trimalleolar fracture of right ankle with routine healing, subsequent encounter     Plan: Patient is touchdown weightbearing only in her boot.  Soap water lotion application return 4 weeks for repeat x-rays right ankle.  Follow-Up Instructions: No follow-ups on file.   Orders:  Orders Placed This Encounter  Procedures  . XR Ankle Complete Right   No orders of the defined types were placed in this encounter.   Imaging: No results found.  PMFS History: Patient Active Problem List   Diagnosis Date Noted  . Closed right ankle fracture 10/06/2018  . Closed displaced trimalleolar fracture of right ankle 10/06/2018  . Acute colitis 02/26/2018  . Hypoxemia 02/24/2018  . Dyspnea 11/26/2017  . Anemia 11/26/2017  . Elevated troponin 11/26/2017  . Hyperkalemia 09/29/2017  . Acute respiratory failure ([place]) 09/28/2017  . CAP (community acquired pneumonia) 09/26/2017  . Volume overload 09/25/2017  . Acute on chronic diastolic CHF (congestive heart failure) ([place]) 09/25/2017  . Lactic acidosis 09/25/2017  . Hypokalemia 09/25/2017  . Breast cancer ([place])  07/22/2017  . SOB (shortness of breath)   . Acute respiratory failure with hypoxia ([place]) 07/21/2017  . Flatulence 10/25/2016  . GERD (gastroesophageal reflux disease) 08/02/2016  . Pyloric stenosis, acquired   . Nausea with vomiting   . Generalized abdominal pain   . Chest pain, rule out acute myocardial infarction 07/04/2016  . Constipation 07/04/2016  . Nausea 07/04/2016  . Essential hypertension 07/04/2016  . HCAP (healthcare-associated pneumonia) 12/04/2015  . End stage renal disease on dialysis ([place]) 12/04/2015  . Diabetes mellitus ([place]) 12/04/2015   Past Medical History:  Diagnosis Date  . Anemia   . Ankle fracture   . Arthritis   . Blood transfusion without reported diagnosis   . Breast cancer ([place])   . Cancer ([place])   . Diabetes mellitus without complication ([place])   . Dialysis patient ([place])    mon, wed, friday,   . End stage renal disease on dialysis [facility])    M/W/F [facility] in [place]  . GERD (gastroesophageal reflux disease)   . Hypertension   . OSA (obstructive sleep apnea)    uses CPAP sometimes  . Pneumonia   . PONV (postoperative nausea and vomiting)   . Wears glasses     Family History  Problem Relation Age of Onset  . Diabetes Mellitus II Mother   . Hypertension Mother   . Hypertension Sister   . Hypertension Sister   . Colon cancer Neg Hx  Past Surgical History:  Procedure Laterality Date  . ABDOMINAL HYSTERECTOMY    . AV FISTULA PLACEMENT  11/2014   at [place] N/A 07/10/2016   Procedure: BALLOON DILATION;  Surgeon: Danie Binder, MD;  Location: [facility];  Service: Endoscopy;  Laterality: N/A;  Pyloric dilation  . BREAST LUMPECTOMY    . CESAREAN SECTION    . CHOLECYSTECTOMY    . COLONOSCOPY WITH PROPOFOL N/A 09/27/2016   Dr. Gala Romney: Internal hemorrhoids repeat colonoscopy in 10 years  . DILATION AND CURETTAGE OF UTERUS    . ESOPHAGOGASTRODUODENOSCOPY N/A 07/10/2016   Dr.Fields- normal esophagus, gastric  stenosis was found at the pylorus, gastritis on bx, normal examined duodenun  . EXTERNAL FIXATION REMOVAL Right 10/29/2018   Procedure: REMOVAL RIGHT ANKLE BIOMET ZIMMER EXTERNAL FIXATOR, SHORT LEG CAST APPLICATION;  Surgeon: Marybelle Killings, MD;  Location: [place];  Service: Orthopedics;  Laterality: Right;  . MASTECTOMY     left sided  . ORIF ANKLE FRACTURE Right 10/06/2018   Procedure: OPEN REDUCTION INTERNAL FIXATION (ORIF) RIGHT ANKLE TRIMALLEOLAR;  Surgeon: Marybelle Killings, MD;  Location: [place];  Service: Orthopedics;  Laterality: Right;   Social History   Occupational History  . Not on file  Tobacco Use  . Smoking status: Never Smoker  . Smokeless tobacco: Never Used  Substance and Sexual Activity  . Alcohol use: No    Alcohol/week: 0.0 standard drinks  . Drug use: No  . Sexual activity: Not on file

## 2018-11-24 DIAGNOSIS — N2581 Secondary hyperparathyroidism of renal origin: Secondary | ICD-10-CM | POA: Diagnosis not present

## 2018-11-24 DIAGNOSIS — N186 End stage renal disease: Secondary | ICD-10-CM | POA: Diagnosis not present

## 2018-11-24 DIAGNOSIS — Z992 Dependence on renal dialysis: Secondary | ICD-10-CM | POA: Diagnosis not present

## 2018-11-24 DIAGNOSIS — D509 Iron deficiency anemia, unspecified: Secondary | ICD-10-CM | POA: Diagnosis not present

## 2018-11-24 DIAGNOSIS — D631 Anemia in chronic kidney disease: Secondary | ICD-10-CM | POA: Diagnosis not present

## 2018-11-26 DIAGNOSIS — D631 Anemia in chronic kidney disease: Secondary | ICD-10-CM | POA: Diagnosis not present

## 2018-11-26 DIAGNOSIS — D509 Iron deficiency anemia, unspecified: Secondary | ICD-10-CM | POA: Diagnosis not present

## 2018-11-26 DIAGNOSIS — N186 End stage renal disease: Secondary | ICD-10-CM | POA: Diagnosis not present

## 2018-11-26 DIAGNOSIS — N2581 Secondary hyperparathyroidism of renal origin: Secondary | ICD-10-CM | POA: Diagnosis not present

## 2018-11-26 DIAGNOSIS — Z992 Dependence on renal dialysis: Secondary | ICD-10-CM | POA: Diagnosis not present

## 2018-11-28 DIAGNOSIS — Z992 Dependence on renal dialysis: Secondary | ICD-10-CM | POA: Diagnosis not present

## 2018-11-28 DIAGNOSIS — N186 End stage renal disease: Secondary | ICD-10-CM | POA: Diagnosis not present

## 2018-11-28 DIAGNOSIS — D509 Iron deficiency anemia, unspecified: Secondary | ICD-10-CM | POA: Diagnosis not present

## 2018-11-28 DIAGNOSIS — D631 Anemia in chronic kidney disease: Secondary | ICD-10-CM | POA: Diagnosis not present

## 2018-11-28 DIAGNOSIS — N2581 Secondary hyperparathyroidism of renal origin: Secondary | ICD-10-CM | POA: Diagnosis not present

## 2018-12-01 DIAGNOSIS — Z992 Dependence on renal dialysis: Secondary | ICD-10-CM | POA: Diagnosis not present

## 2018-12-01 DIAGNOSIS — D509 Iron deficiency anemia, unspecified: Secondary | ICD-10-CM | POA: Diagnosis not present

## 2018-12-01 DIAGNOSIS — N186 End stage renal disease: Secondary | ICD-10-CM | POA: Diagnosis not present

## 2018-12-01 DIAGNOSIS — D631 Anemia in chronic kidney disease: Secondary | ICD-10-CM | POA: Diagnosis not present

## 2018-12-01 DIAGNOSIS — N2581 Secondary hyperparathyroidism of renal origin: Secondary | ICD-10-CM | POA: Diagnosis not present

## 2018-12-02 DIAGNOSIS — Z992 Dependence on renal dialysis: Secondary | ICD-10-CM | POA: Diagnosis not present

## 2018-12-02 DIAGNOSIS — N186 End stage renal disease: Secondary | ICD-10-CM | POA: Diagnosis not present

## 2018-12-03 DIAGNOSIS — N186 End stage renal disease: Secondary | ICD-10-CM | POA: Diagnosis not present

## 2018-12-03 DIAGNOSIS — D509 Iron deficiency anemia, unspecified: Secondary | ICD-10-CM | POA: Diagnosis not present

## 2018-12-03 DIAGNOSIS — N2581 Secondary hyperparathyroidism of renal origin: Secondary | ICD-10-CM | POA: Diagnosis not present

## 2018-12-03 DIAGNOSIS — Z992 Dependence on renal dialysis: Secondary | ICD-10-CM | POA: Diagnosis not present

## 2018-12-03 DIAGNOSIS — D631 Anemia in chronic kidney disease: Secondary | ICD-10-CM | POA: Diagnosis not present

## 2018-12-05 DIAGNOSIS — D631 Anemia in chronic kidney disease: Secondary | ICD-10-CM | POA: Diagnosis not present

## 2018-12-05 DIAGNOSIS — Z992 Dependence on renal dialysis: Secondary | ICD-10-CM | POA: Diagnosis not present

## 2018-12-05 DIAGNOSIS — N186 End stage renal disease: Secondary | ICD-10-CM | POA: Diagnosis not present

## 2018-12-05 DIAGNOSIS — D509 Iron deficiency anemia, unspecified: Secondary | ICD-10-CM | POA: Diagnosis not present

## 2018-12-05 DIAGNOSIS — N2581 Secondary hyperparathyroidism of renal origin: Secondary | ICD-10-CM | POA: Diagnosis not present

## 2018-12-07 DIAGNOSIS — N186 End stage renal disease: Secondary | ICD-10-CM | POA: Diagnosis not present

## 2018-12-07 DIAGNOSIS — Z992 Dependence on renal dialysis: Secondary | ICD-10-CM | POA: Diagnosis not present

## 2018-12-07 DIAGNOSIS — D631 Anemia in chronic kidney disease: Secondary | ICD-10-CM | POA: Diagnosis not present

## 2018-12-07 DIAGNOSIS — N2581 Secondary hyperparathyroidism of renal origin: Secondary | ICD-10-CM | POA: Diagnosis not present

## 2018-12-07 DIAGNOSIS — D509 Iron deficiency anemia, unspecified: Secondary | ICD-10-CM | POA: Diagnosis not present

## 2018-12-09 DIAGNOSIS — Z992 Dependence on renal dialysis: Secondary | ICD-10-CM | POA: Diagnosis not present

## 2018-12-09 DIAGNOSIS — D631 Anemia in chronic kidney disease: Secondary | ICD-10-CM | POA: Diagnosis not present

## 2018-12-09 DIAGNOSIS — D509 Iron deficiency anemia, unspecified: Secondary | ICD-10-CM | POA: Diagnosis not present

## 2018-12-09 DIAGNOSIS — N186 End stage renal disease: Secondary | ICD-10-CM | POA: Diagnosis not present

## 2018-12-09 DIAGNOSIS — N2581 Secondary hyperparathyroidism of renal origin: Secondary | ICD-10-CM | POA: Diagnosis not present

## 2018-12-12 DIAGNOSIS — D631 Anemia in chronic kidney disease: Secondary | ICD-10-CM | POA: Diagnosis not present

## 2018-12-12 DIAGNOSIS — N186 End stage renal disease: Secondary | ICD-10-CM | POA: Diagnosis not present

## 2018-12-12 DIAGNOSIS — N2581 Secondary hyperparathyroidism of renal origin: Secondary | ICD-10-CM | POA: Diagnosis not present

## 2018-12-12 DIAGNOSIS — D509 Iron deficiency anemia, unspecified: Secondary | ICD-10-CM | POA: Diagnosis not present

## 2018-12-12 DIAGNOSIS — Z992 Dependence on renal dialysis: Secondary | ICD-10-CM | POA: Diagnosis not present

## 2018-12-15 DIAGNOSIS — D509 Iron deficiency anemia, unspecified: Secondary | ICD-10-CM | POA: Diagnosis not present

## 2018-12-15 DIAGNOSIS — D631 Anemia in chronic kidney disease: Secondary | ICD-10-CM | POA: Diagnosis not present

## 2018-12-15 DIAGNOSIS — N186 End stage renal disease: Secondary | ICD-10-CM | POA: Diagnosis not present

## 2018-12-15 DIAGNOSIS — Z992 Dependence on renal dialysis: Secondary | ICD-10-CM | POA: Diagnosis not present

## 2018-12-15 DIAGNOSIS — N2581 Secondary hyperparathyroidism of renal origin: Secondary | ICD-10-CM | POA: Diagnosis not present

## 2018-12-17 DIAGNOSIS — Z992 Dependence on renal dialysis: Secondary | ICD-10-CM | POA: Diagnosis not present

## 2018-12-17 DIAGNOSIS — N186 End stage renal disease: Secondary | ICD-10-CM | POA: Diagnosis not present

## 2018-12-17 DIAGNOSIS — D631 Anemia in chronic kidney disease: Secondary | ICD-10-CM | POA: Diagnosis not present

## 2018-12-17 DIAGNOSIS — D509 Iron deficiency anemia, unspecified: Secondary | ICD-10-CM | POA: Diagnosis not present

## 2018-12-17 DIAGNOSIS — N2581 Secondary hyperparathyroidism of renal origin: Secondary | ICD-10-CM | POA: Diagnosis not present

## 2018-12-19 ENCOUNTER — Ambulatory Visit (INDEPENDENT_AMBULATORY_CARE_PROVIDER_SITE_OTHER): Payer: Medicare Other

## 2018-12-19 ENCOUNTER — Encounter (INDEPENDENT_AMBULATORY_CARE_PROVIDER_SITE_OTHER): Payer: Self-pay | Admitting: Orthopaedic Surgery

## 2018-12-19 ENCOUNTER — Ambulatory Visit (INDEPENDENT_AMBULATORY_CARE_PROVIDER_SITE_OTHER): Payer: Medicare Other | Admitting: Orthopaedic Surgery

## 2018-12-19 VITALS — BP 175/80 | HR 98 | Ht 63.0 in | Wt 246.0 lb

## 2018-12-19 DIAGNOSIS — S82851D Displaced trimalleolar fracture of right lower leg, subsequent encounter for closed fracture with routine healing: Secondary | ICD-10-CM | POA: Diagnosis not present

## 2018-12-19 DIAGNOSIS — N186 End stage renal disease: Secondary | ICD-10-CM | POA: Diagnosis not present

## 2018-12-19 DIAGNOSIS — D631 Anemia in chronic kidney disease: Secondary | ICD-10-CM | POA: Diagnosis not present

## 2018-12-19 DIAGNOSIS — Z992 Dependence on renal dialysis: Secondary | ICD-10-CM | POA: Diagnosis not present

## 2018-12-19 DIAGNOSIS — N2581 Secondary hyperparathyroidism of renal origin: Secondary | ICD-10-CM | POA: Diagnosis not present

## 2018-12-19 DIAGNOSIS — D509 Iron deficiency anemia, unspecified: Secondary | ICD-10-CM | POA: Diagnosis not present

## 2018-12-19 NOTE — Progress Notes (Signed)
Postop trimalleolar ankle fixation initially treated with external fixator due to extensive swelling and fracture blisters.  Skin looks good.  Three-view x-rays done straight good position and alignment and interval healing.  She can begin weightbearing as tolerated on her ankle beginning at 50% weightbearing and progress as tolerated with a walker.  I will check her back in 4 weeks and she will bring her tennis shoe.

## 2018-12-22 ENCOUNTER — Emergency Department (HOSPITAL_COMMUNITY): Payer: Medicare Other

## 2018-12-22 ENCOUNTER — Encounter (HOSPITAL_COMMUNITY): Payer: Self-pay

## 2018-12-22 ENCOUNTER — Other Ambulatory Visit: Payer: Self-pay

## 2018-12-22 ENCOUNTER — Emergency Department (HOSPITAL_COMMUNITY)
Admission: EM | Admit: 2018-12-22 | Discharge: 2018-12-22 | Disposition: A | Discharge status: Home / Self Care | Payer: Medicare Other | Attending: Emergency Medicine | Admitting: Emergency Medicine

## 2018-12-22 DIAGNOSIS — Z79899 Other long term (current) drug therapy: Secondary | ICD-10-CM | POA: Insufficient documentation

## 2018-12-22 DIAGNOSIS — J189 Pneumonia, unspecified organism: Secondary | ICD-10-CM

## 2018-12-22 DIAGNOSIS — Z7984 Long term (current) use of oral hypoglycemic drugs: Secondary | ICD-10-CM | POA: Diagnosis not present

## 2018-12-22 DIAGNOSIS — R0602 Shortness of breath: Secondary | ICD-10-CM | POA: Diagnosis not present

## 2018-12-22 DIAGNOSIS — R519 Headache, unspecified: Secondary | ICD-10-CM

## 2018-12-22 DIAGNOSIS — Z992 Dependence on renal dialysis: Secondary | ICD-10-CM | POA: Diagnosis not present

## 2018-12-22 DIAGNOSIS — R918 Other nonspecific abnormal finding of lung field: Secondary | ICD-10-CM | POA: Diagnosis not present

## 2018-12-22 DIAGNOSIS — R52 Pain, unspecified: Secondary | ICD-10-CM | POA: Diagnosis not present

## 2018-12-22 DIAGNOSIS — I10 Essential (primary) hypertension: Secondary | ICD-10-CM | POA: Diagnosis not present

## 2018-12-22 DIAGNOSIS — N186 End stage renal disease: Secondary | ICD-10-CM | POA: Diagnosis not present

## 2018-12-22 DIAGNOSIS — I5032 Chronic diastolic (congestive) heart failure: Secondary | ICD-10-CM | POA: Insufficient documentation

## 2018-12-22 DIAGNOSIS — E119 Type 2 diabetes mellitus without complications: Secondary | ICD-10-CM | POA: Diagnosis not present

## 2018-12-22 DIAGNOSIS — R55 Syncope and collapse: Secondary | ICD-10-CM | POA: Diagnosis not present

## 2018-12-22 DIAGNOSIS — I132 Hypertensive heart and chronic kidney disease with heart failure and with stage 5 chronic kidney disease, or end stage renal disease: Secondary | ICD-10-CM | POA: Insufficient documentation

## 2018-12-22 DIAGNOSIS — R51 Headache: Secondary | ICD-10-CM | POA: Diagnosis not present

## 2018-12-22 DIAGNOSIS — R569 Unspecified convulsions: Secondary | ICD-10-CM | POA: Diagnosis not present

## 2018-12-22 DIAGNOSIS — R4182 Altered mental status, unspecified: Secondary | ICD-10-CM | POA: Diagnosis not present

## 2018-12-22 DIAGNOSIS — N2581 Secondary hyperparathyroidism of renal origin: Secondary | ICD-10-CM | POA: Diagnosis not present

## 2018-12-22 DIAGNOSIS — R58 Hemorrhage, not elsewhere classified: Secondary | ICD-10-CM | POA: Diagnosis not present

## 2018-12-22 DIAGNOSIS — D631 Anemia in chronic kidney disease: Secondary | ICD-10-CM | POA: Diagnosis not present

## 2018-12-22 DIAGNOSIS — J181 Lobar pneumonia, unspecified organism: Secondary | ICD-10-CM

## 2018-12-22 DIAGNOSIS — D509 Iron deficiency anemia, unspecified: Secondary | ICD-10-CM | POA: Diagnosis not present

## 2018-12-22 LAB — URINALYSIS, ROUTINE W REFLEX MICROSCOPIC
BACTERIA UA: NONE SEEN
Bilirubin Urine: NEGATIVE
Hgb urine dipstick: NEGATIVE
Ketones, ur: NEGATIVE mg/dL
Leukocytes, UA: NEGATIVE
Nitrite: NEGATIVE
PROTEIN: 100 mg/dL — AB
Specific Gravity, Urine: 1.007 (ref 1.005–1.030)
pH: 9 — ABNORMAL HIGH (ref 5.0–8.0)

## 2018-12-22 LAB — DIFFERENTIAL
BASOS ABS: 0 10*3/uL (ref 0.0–0.1)
Basophils Relative: 0 %
Eosinophils Absolute: 0.3 10*3/uL (ref 0.0–0.5)
Eosinophils Relative: 3 %
LYMPHS ABS: 2.1 10*3/uL (ref 0.7–4.0)
LYMPHS PCT: 18 %
MONOS PCT: 3 %
Monocytes Absolute: 0.4 10*3/uL (ref 0.1–1.0)
NEUTROS ABS: 8.5 10*3/uL — AB (ref 1.7–7.7)
NEUTROS PCT: 75 %

## 2018-12-22 LAB — POC OCCULT BLOOD, ED: Fecal Occult Bld: NEGATIVE

## 2018-12-22 LAB — PROTIME-INR
INR: 1.03
PROTHROMBIN TIME: 13.4 s (ref 11.4–15.2)

## 2018-12-22 LAB — TYPE AND SCREEN
ABO/RH(D): O POS
Antibody Screen: NEGATIVE

## 2018-12-22 LAB — CBC
HCT: 29 % — ABNORMAL LOW (ref 36.0–46.0)
Hemoglobin: 8.8 g/dL — ABNORMAL LOW (ref 12.0–15.0)
MCH: 26.9 pg (ref 26.0–34.0)
MCHC: 30.3 g/dL (ref 30.0–36.0)
MCV: 88.7 fL (ref 80.0–100.0)
NRBC: 0 % (ref 0.0–0.2)
PLATELETS: 303 10*3/uL (ref 150–400)
RBC: 3.27 MIL/uL — ABNORMAL LOW (ref 3.87–5.11)
RDW: 15.6 % — AB (ref 11.5–15.5)
WBC: 11.4 10*3/uL — AB (ref 4.0–10.5)

## 2018-12-22 LAB — BASIC METABOLIC PANEL
Anion gap: 15 (ref 5–15)
BUN: 63 mg/dL — ABNORMAL HIGH (ref 6–20)
CALCIUM: 10.1 mg/dL (ref 8.9–10.3)
CO2: 23 mmol/L (ref 22–32)
CREATININE: 11.92 mg/dL — AB (ref 0.44–1.00)
Chloride: 99 mmol/L (ref 98–111)
GFR calc non Af Amer: 3 mL/min — ABNORMAL LOW (ref >60)
GFR, EST AFRICAN AMERICAN: 4 mL/min — AB (ref >60)
Glucose, Bld: 193 mg/dL — ABNORMAL HIGH (ref 70–99)
Potassium: 4.8 mmol/L (ref 3.5–5.1)
SODIUM: 137 mmol/L (ref 135–145)

## 2018-12-22 LAB — LACTIC ACID, PLASMA: Lactic Acid, Venous: 1.6 mmol/L (ref 0.5–1.9)

## 2018-12-22 MED ORDER — DOXYCYCLINE HYCLATE 100 MG PO CAPS: 100.0000 mg | ORAL_CAPSULE | Freq: Two times a day (BID) | ORAL | 0 refills | Status: DC

## 2018-12-22 MED ORDER — ONDANSETRON HCL 4 MG/2ML IJ SOLN
4.0000 mg | Freq: Once | INTRAMUSCULAR | Status: AC
  Administered 2018-12-22: 4 mg via INTRAVENOUS
  Filled 2018-12-22: qty 2

## 2018-12-22 MED ORDER — LABETALOL HCL 5 MG/ML IV SOLN
10.0000 mg | Freq: Once | INTRAVENOUS | Status: AC
  Administered 2018-12-22: 10 mg via INTRAVENOUS
  Filled 2018-12-22: qty 4

## 2018-12-22 MED ORDER — MORPHINE SULFATE (PF) 2 MG/ML IV SOLN
2.0000 mg | Freq: Once | INTRAVENOUS | Status: AC
  Administered 2018-12-22: 2 mg via INTRAVENOUS
  Filled 2018-12-22: qty 1

## 2018-12-22 MED ORDER — IOPAMIDOL (ISOVUE-370) INJECTION 76%
100.0000 mL | Freq: Once | INTRAVENOUS | Status: AC | PRN
  Administered 2018-12-22: 100 mL via INTRAVENOUS

## 2018-12-22 MED ORDER — DOXYCYCLINE HYCLATE 100 MG PO TABS: 100.0000 mg | ORAL_TABLET | Freq: Once | ORAL | Status: DC

## 2018-12-22 MED ORDER — ACETAMINOPHEN 325 MG PO TABS
650.0000 mg | ORAL_TABLET | Freq: Once | ORAL | Status: AC
  Administered 2018-12-22: 650 mg via ORAL
  Filled 2018-12-22: qty 2

## 2018-12-22 NOTE — ED Notes (Signed)
Dialysis Appt scheduled for 10:30 am 12/23/2018

## 2018-12-22 NOTE — ED Notes (Signed)
Pts O2 went down to 90%. Have put on 1L [place]

## 2018-12-22 NOTE — ED Provider Notes (Signed)
[facility] EMERGENCY DEPARTMENT Provider Note   CSN: [id number] Arrival date & time: 12/22/18  0754     History   Chief Complaint Chief Complaint  Patient presents with  . Loss of Consciousness    HPI Patricia [name] is a 48 y.o. female with a past medical history significant for end-stage renal disease on dialysis, type 2 diabetes, GERD, history of breast cancer and chronic anemia presenting with a possible syncopal versus seizure like episode at dialysis this morning.  Patient states that the needle insertion for her dialysis was especially painful this morning and she was unable to tolerate it, the pain got worse as she started the dialysis procedure and she insisted the needle be removed, stating it hurts so bad she had to close her eyes and felt like she could not open them.    She does report remembering everything, including the nurses continually calling her name.  She denies history of seizure disorder.She does endorse being increasingly fatigued recently and has been more sedentary secondary to a right foot fracture.  She was given permission to weight-bear by her orthopedist and she ambulated yesterday, noting that she had increased shortness of breath with walking.  She denies pain or swelling in her extremities.  She was 3.8 pounds over her dry weight this morning.  She did not undergo dialysis.  She also endorses having shaking chills this morning.  She denies any flulike symptoms, specifically no sore throat,  nausea, vomiting, cough, myalgias or recognized fever but does endorse shortness of breath.  She endorses having a severe headache which started before arrival.   The history is provided by the patient and medical records.    Past Medical History:  Diagnosis Date  . Anemia   . Ankle fracture   . Arthritis   . Blood transfusion without reported diagnosis   . Breast cancer ([place])   . Cancer ([place])   . Diabetes mellitus without complication ([place])   . Dialysis patient ([place])     mon, wed, friday,   . End stage renal disease on dialysis [facility])    M/W/F [facility] in [place]  . GERD (gastroesophageal reflux disease)   . Hypertension   . OSA (obstructive sleep apnea)    uses CPAP sometimes  . Pneumonia   . PONV (postoperative nausea and vomiting)   . Wears glasses     Patient Active Problem List   Diagnosis Date Noted  . Closed right ankle fracture 10/06/2018  . Closed displaced trimalleolar fracture of right ankle 10/06/2018  . Acute colitis 02/26/2018  . Hypoxemia 02/24/2018  . Dyspnea 11/26/2017  . Anemia 11/26/2017  . Elevated troponin 11/26/2017  . Hyperkalemia 09/29/2017  . Acute respiratory failure ([place]) 09/28/2017  . CAP (community acquired pneumonia) 09/26/2017  . Volume overload 09/25/2017  . Acute on chronic diastolic CHF (congestive heart failure) ([place]) 09/25/2017  . Lactic acidosis 09/25/2017  . Hypokalemia 09/25/2017  . Breast cancer ([place]) 07/22/2017  . SOB (shortness of breath)   . Acute respiratory failure with hypoxia ([place]) 07/21/2017  . Flatulence 10/25/2016  . GERD (gastroesophageal reflux disease) 08/02/2016  . Pyloric stenosis, acquired   . Nausea with vomiting   . Generalized abdominal pain   . Chest pain, rule out acute myocardial infarction 07/04/2016  . Constipation 07/04/2016  . Nausea 07/04/2016  . Essential hypertension 07/04/2016  . HCAP (healthcare-associated pneumonia) 12/04/2015  . End stage renal disease on dialysis ([place]) 12/04/2015  . Diabetes mellitus ([place]) 12/04/2015    Past  Surgical History:  Procedure Laterality Date  . ABDOMINAL HYSTERECTOMY    . AV FISTULA PLACEMENT  11/2014   at [facility] N/A 07/10/2016   Procedure: BALLOON DILATION;  Surgeon: Danie Binder, MD;  Location[facility];  Service: Endoscopy;  Laterality: N/A;  Pyloric dilation  . BREAST LUMPECTOMY    . CESAREAN SECTION    . CHOLECYSTECTOMY    . COLONOSCOPY WITH PROPOFOL N/A 09/27/2016   Dr. Gala Romney: Internal  hemorrhoids repeat colonoscopy in 10 years  . DILATION AND CURETTAGE OF UTERUS    . ESOPHAGOGASTRODUODENOSCOPY N/A 07/10/2016   Dr.Fields- normal esophagus, gastric stenosis was found at the pylorus, gastritis on bx, normal examined duodenun  . EXTERNAL FIXATION REMOVAL Right 10/29/2018   Procedure: REMOVAL RIGHT ANKLE BIOMET ZIMMER EXTERNAL FIXATOR, SHORT LEG CAST APPLICATION;  Surgeon: Marybelle Killings, MD;  Location: [facility];  Service: Orthopedics;  Laterality: Right;  . MASTECTOMY     left sided  . ORIF ANKLE FRACTURE Right 10/06/2018   Procedure: OPEN REDUCTION INTERNAL FIXATION (ORIF) RIGHT ANKLE TRIMALLEOLAR;  Surgeon: Marybelle Killings, MD;  Location: [facility];  Service: Orthopedics;  Laterality: Right;     OB History    Gravida  3   Para  2   Term  1   Preterm  1   AB  1   Living        SAB  1   TAB      Ectopic      Multiple      Live Births               Home Medications    Prior to Admission medications   Medication Sig Start Date End Date Taking? Authorizing Provider  albuterol (PROVENTIL HFA;VENTOLIN HFA) 108 (90 Base) MCG/ACT inhaler Inhale 2 puffs into the lungs every 6 (six) hours as needed for wheezing or shortness of breath. 11/28/17  Yes Kathie Dike, MD  albuterol (PROVENTIL) (2.5 MG/3ML) 0.083% nebulizer solution Take 3 mLs (2.5 mg total) by nebulization every 6 (six) hours as needed for wheezing or shortness of breath. 11/28/17  Yes Kathie Dike, MD  cinacalcet (SENSIPAR) 30 MG tablet Take 30 mg by mouth every evening.    Yes [provider]  Ferric Citrate (AURYXIA) 1 GM 210 MG(Fe) TABS Take 2 tablets by mouth 2 (two) times daily with a meal. With Breakfast & with supper   Yes [provider]  fluticasone (FLONASE) 50 MCG/ACT nasal spray Place 2 sprays into both nostrils daily as needed for allergies or rhinitis.  08/08/17  Yes [provider]  furosemide (LASIX) 40 MG tablet Take 40-80 mg by mouth 2 (two) times daily. Take  2 tablets (80mg ) in the morning and take 1 tablet (40mg ) at bedtime   Yes [provider]  glipiZIDE (GLUCOTROL) 5 MG tablet Take 5 mg by mouth 2 (two) times daily as needed. For blood sugar levels over 150   Yes [provider]  linaclotide (LINZESS) 290 MCG CAPS capsule Take 1 capsule (290 mcg total) by mouth daily before breakfast. 10/24/18  Yes Mahala Menghini, PA-C  multivitamin (RENA-VIT) TABS tablet Take 1 tablet by mouth daily.   Yes [provider]  NIFEdipine (PROCARDIA-XL/ADALAT CC) 60 MG 24 hr tablet Take 1 tablet (60 mg total) by mouth daily. Patient taking differently: Take 60 mg by mouth every evening.  02/28/18  Yes Kathie Dike, MD  ondansetron (ZOFRAN-ODT) 4 MG disintegrating tablet  Take 4 mg by mouth every 8 (eight) hours as needed for nausea or vomiting.   Yes [provider]  oxyCODONE-acetaminophen (PERCOCET) 7.5-325 MG tablet Take 1 tablet by mouth every 6 (six) hours as needed for severe pain. 10/29/18  Yes Lanae Crumbly, PA-C  pantoprazole (PROTONIX) 40 MG tablet Take 40 mg by mouth daily.   Yes [provider]  polyethylene glycol (MIRALAX) packet Take 17 g by mouth daily. 10/14/18  Yes Isla Pence, MD  rOPINIRole (REQUIP XL) 2 MG 24 hr tablet Take 2 mg by mouth at bedtime.  09/10/16  Yes [provider]  sevelamer carbonate (RENVELA) 800 MG tablet Take 800 mg by mouth See admin instructions. Take 2 tablets (1600 mg) in the morning, take 3 tablets (2400 mg) with lunch or snack,  and take 2 tablets (1600 mg) by mouth in the evening with dinner meal.   Yes [provider]  doxycycline (VIBRAMYCIN) 100 MG capsule Take 1 capsule (100 mg total) by mouth 2 (two) times daily. 12/22/18   Evalee Jefferson, PA-C    Family History Family History  Problem Relation Age of Onset  . Diabetes Mellitus II Mother   . Hypertension Mother   . Hypertension Sister   . Hypertension Sister   . Colon cancer Neg Hx     Social  History Social History   Tobacco Use  . Smoking status: Never Smoker  . Smokeless tobacco: Never Used  Substance Use Topics  . Alcohol use: No    Alcohol/week: 0.0 standard drinks  . Drug use: No     Allergies   Amlodipine besylate and Reglan [metoclopramide]   Review of Systems Review of Systems  Constitutional: Positive for chills. Negative for fever.  HENT: Negative for congestion and sore throat.   Eyes: Negative.   Respiratory: Positive for shortness of breath. Negative for chest tightness.   Cardiovascular: Negative for chest pain.  Gastrointestinal: Negative for abdominal pain, nausea and vomiting.  Genitourinary: Negative.   Musculoskeletal: Negative for arthralgias, joint swelling and neck pain.  Skin: Negative.  Negative for rash and wound.  Neurological: Positive for syncope, weakness and headaches. Negative for dizziness, light-headedness and numbness.  Psychiatric/Behavioral: Negative.      Physical Exam Updated Vital Signs BP (!) 151/85 (BP Location: Right Arm)   Pulse 87   Temp 98.9 F (37.2 C) (Oral)   Resp 16   SpO2 99%   Physical Exam Vitals signs and nursing note reviewed.  Constitutional:      Appearance: Normal appearance. She is well-developed. She is obese.  HENT:     Head: Normocephalic and atraumatic.     Mouth/Throat:     Mouth: Mucous membranes are dry.  Eyes:     Conjunctiva/sclera: Conjunctivae normal.  Neck:     Musculoskeletal: Normal range of motion.  Cardiovascular:     Rate and Rhythm: Normal rate and regular rhythm.     Heart sounds: Normal heart sounds.  Pulmonary:     Effort: Pulmonary effort is normal.     Breath sounds: Normal breath sounds. No wheezing.  Abdominal:     General: Bowel sounds are normal.     Palpations: Abdomen is soft.     Tenderness: There is no abdominal tenderness.  Musculoskeletal: Normal range of motion.        General: No tenderness.     Right lower leg: No edema.     Left lower leg: No  edema.  Skin:    General:  Skin is warm and dry.  Neurological:     General: No focal deficit present.     Mental Status: She is alert and oriented to person, place, and time.      ED Treatments / Results  Labs (all labs ordered are listed, but only abnormal results are displayed) Labs Reviewed  BASIC METABOLIC PANEL - Abnormal; Notable for the following components:      Result Value   Glucose, Bld 193 (*)    BUN 63 (*)    Creatinine, Ser 11.92 (*)    GFR calc non Af Amer 3 (*)    GFR calc Af Amer 4 (*)    All other components within normal limits  CBC - Abnormal; Notable for the following components:   WBC 11.4 (*)    RBC 3.27 (*)    Hemoglobin 8.8 (*)    HCT 29.0 (*)    RDW 15.6 (*)    All other components within normal limits  URINALYSIS, ROUTINE W REFLEX MICROSCOPIC - Abnormal; Notable for the following components:   Color, Urine STRAW (*)    pH 9.0 (*)    Glucose, UA >=500 (*)    Protein, ur 100 (*)    All other components within normal limits  DIFFERENTIAL - Abnormal; Notable for the following components:   Neutro Abs 8.5 (*)    All other components within normal limits  CULTURE, BLOOD (ROUTINE X 2)  CULTURE, BLOOD (ROUTINE X 2)  PROTIME-INR  LACTIC ACID, PLASMA  POC OCCULT BLOOD, ED  TYPE AND SCREEN    EKG None  Radiology Dg Chest 2 View  Result Date: 12/22/2018 CLINICAL DATA:  Shortness of breath and fevers EXAM: CHEST - 2 VIEW COMPARISON:  10/06/2018 FINDINGS: Cardiac shadow is mildly enlarged but stable. Postsurgical changes are noted on the left consistent with prior mastectomy. The lungs are well aerated bilaterally. Patchy infiltrative density is noted in the right lung base projecting in the right lower lobe on the lateral film. No effusion or pneumothorax is noted. Degenerative changes of thoracic spine are seen. IMPRESSION: Early infiltrate in the right lower lobe. Electronically Signed   By: Inez Catalina M.D.   On: 12/22/2018 09:18   Ct Head Wo  Contrast  Result Date: 12/22/2018 CLINICAL DATA:  Lethargy and altered mental status during dialysis this morning. EXAM: CT HEAD WITHOUT CONTRAST TECHNIQUE: Contiguous axial images were obtained from the base of the skull through the vertex without intravenous contrast. COMPARISON:  None. FINDINGS: Brain: No evidence of acute infarction, hemorrhage, hydrocephalus, extra-axial collection or mass lesion/mass effect. Mild periventricular and subcortical white matter hypodensities in the right frontal lobe are nonspecific, but favored to reflect chronic microvascular ischemic changes. Vascular: Atherosclerotic vascular calcification of the carotid siphons. No hyperdense vessel. Skull: Normal. Negative for fracture or focal lesion. Sinuses/Orbits: Complete opacification of the left mastoid air cells. Partial opacification of the right mastoid air cells. The paranasal sinuses are clear. The orbits are unremarkable. Other: None. IMPRESSION: 1.  No acute intracranial abnormality. 2. Complete left and partial right mastoid air cell opacification. Electronically Signed   By: Titus Dubin M.D.   On: 12/22/2018 09:23   Ct Angio Chest Pe W And/or Wo Contrast  Result Date: 12/22/2018 CLINICAL DATA:  Shortness of breath. EXAM: CT ANGIOGRAPHY CHEST WITH CONTRAST TECHNIQUE: Multidetector CT imaging of the chest was performed using the standard protocol during bolus administration of intravenous contrast. Multiplanar CT image reconstructions and MIPs were obtained to evaluate the vascular anatomy.  CONTRAST:  171mL ISOVUE-370 IOPAMIDOL (ISOVUE-370) INJECTION 76% COMPARISON:  CTA chest dated February 24, 2018. FINDINGS: Cardiovascular: Satisfactory opacification of the pulmonary arteries to the segmental level. No evidence of pulmonary embolism. Stable cardiomegaly. No pericardial effusion. No thoracic aortic aneurysm or dissection. Mild atherosclerosis of the aortic arch. Mediastinum/Nodes: Mildly enlarged right paratracheal and  subcarinal lymphadenopathy is similar to prior studies. No axillary or hilar lymphadenopathy. Unchanged right thyroid goiter. The trachea and esophagus demonstrate no significant findings. Lungs/Pleura: Hazy dependent ground-glass densities and subsegmental atelectasis in both lungs. Trace right pleural effusion with interlobular septal thickening at the right greater than left lung bases. No consolidation or pneumothorax. No suspicious pulmonary nodule. Upper Abdomen: No acute abnormality. Musculoskeletal: Prior left mastectomy. No chest wall abnormality. No acute or significant osseous findings. Review of the MIP images confirms the above findings. IMPRESSION: 1.  No evidence of pulmonary embolism. 2. Mild interstitial and alveolar pulmonary edema in the right greater than left lungs. 3.  Aortic atherosclerosis (ICD10-I70.0). Electronically Signed   By: Titus Dubin M.D.   On: 12/22/2018 13:14    Procedures Procedures (including critical care time)  Medications Ordered in ED Medications  acetaminophen (TYLENOL) tablet 650 mg (has no administration in time range)  morphine 2 MG/ML injection 2 mg (2 mg Intravenous Given 12/22/18 0944)  ondansetron (ZOFRAN) injection 4 mg (4 mg Intravenous Given 12/22/18 0944)  labetalol (NORMODYNE,TRANDATE) injection 10 mg (10 mg Intravenous Given 12/22/18 0945)  iopamidol (ISOVUE-370) 76 % injection 100 mL (100 mLs Intravenous Contrast Given 12/22/18 1250)     Initial Impression / Assessment and Plan / ED Course  I have reviewed the triage vital signs and the nursing notes.  Pertinent labs & imaging results that were available during my care of the patient were reviewed by me and considered in my medical decision making (see chart for details).     Patient with a probable near syncopal event this morning, her history suggests this may have been induced by pain, she was not completely supine but was reclined during this incident.  She has had shaking chills,  shortness of breath chest x-ray suggesting pneumonia although this was not confirmed by CT scan.  However CT was negative for pulmonary embolism.  Patient was felt to be stable for discharge home.  She will need close follow-up for dialysis.  RN called the dialysis center and arrange this for tomorrow.  She was started on doxycycline for her cough and chills and suggestion of pneumonia on her chest x-ray.  Patient was discussed with Dr. Roderic Palau prior to discharge home.  Close follow-up with her PCP recommended if symptoms persist, returning here for any worsening symptoms.  Final Clinical Impressions(s) / ED Diagnoses   Final diagnoses:  Near syncope  Nonintractable headache, unspecified chronicity pattern, unspecified headache type  Community acquired pneumonia of right lower lobe of lung [facility])    ED Discharge Orders         Ordered    doxycycline (VIBRAMYCIN) 100 MG capsule  2 times daily     12/22/18 1449           Evalee Jefferson, PA-C 12/22/18 1504    Milton Ferguson, MD 12/22/18 1528

## 2018-12-22 NOTE — ED Notes (Signed)
Unsuccessful IV attempt x1. 

## 2018-12-22 NOTE — Discharge Instructions (Addendum)
Rest and continue taking tylenol if needed for headache relief.  You have been prescribed an antibiotic for your lung infection, take the entire course of this medicine.  Plan to get caught up with the dialysis you missed tomorrow.

## 2018-12-22 NOTE — ED Notes (Signed)
Pt to xray and CT

## 2018-12-22 NOTE — ED Notes (Signed)
Pt now states she is having a bad headache also. States she lost "a lot" of blood Thursday and today when they pulled the needle out. Feels weak

## 2018-12-22 NOTE — ED Triage Notes (Signed)
Per EMS, [facility] states she possibly had "seizure like activity." Pt states when they inserted the needle in her arm, it hurt her so bad that the pain caused her to close her eyes and she felt like she couldn't open them. She remembers everything. NAD. Alert and oriented. Pt also has a broken right ankle. Did NOT receive any dialysis treatment today.

## 2018-12-23 DIAGNOSIS — N2581 Secondary hyperparathyroidism of renal origin: Secondary | ICD-10-CM | POA: Diagnosis not present

## 2018-12-23 DIAGNOSIS — Z992 Dependence on renal dialysis: Secondary | ICD-10-CM | POA: Diagnosis not present

## 2018-12-23 DIAGNOSIS — D631 Anemia in chronic kidney disease: Secondary | ICD-10-CM | POA: Diagnosis not present

## 2018-12-23 DIAGNOSIS — D509 Iron deficiency anemia, unspecified: Secondary | ICD-10-CM | POA: Diagnosis not present

## 2018-12-23 DIAGNOSIS — N186 End stage renal disease: Secondary | ICD-10-CM | POA: Diagnosis not present

## 2018-12-24 DIAGNOSIS — N186 End stage renal disease: Secondary | ICD-10-CM | POA: Diagnosis not present

## 2018-12-24 DIAGNOSIS — D631 Anemia in chronic kidney disease: Secondary | ICD-10-CM | POA: Diagnosis not present

## 2018-12-24 DIAGNOSIS — Z992 Dependence on renal dialysis: Secondary | ICD-10-CM | POA: Diagnosis not present

## 2018-12-24 DIAGNOSIS — N2581 Secondary hyperparathyroidism of renal origin: Secondary | ICD-10-CM | POA: Diagnosis not present

## 2018-12-24 DIAGNOSIS — D509 Iron deficiency anemia, unspecified: Secondary | ICD-10-CM | POA: Diagnosis not present

## 2018-12-26 DIAGNOSIS — Z992 Dependence on renal dialysis: Secondary | ICD-10-CM | POA: Diagnosis not present

## 2018-12-26 DIAGNOSIS — D631 Anemia in chronic kidney disease: Secondary | ICD-10-CM | POA: Diagnosis not present

## 2018-12-26 DIAGNOSIS — N2581 Secondary hyperparathyroidism of renal origin: Secondary | ICD-10-CM | POA: Diagnosis not present

## 2018-12-26 DIAGNOSIS — N186 End stage renal disease: Secondary | ICD-10-CM | POA: Diagnosis not present

## 2018-12-26 DIAGNOSIS — D509 Iron deficiency anemia, unspecified: Secondary | ICD-10-CM | POA: Diagnosis not present

## 2018-12-27 LAB — CULTURE, BLOOD (ROUTINE X 2)
CULTURE: NO GROWTH
CULTURE: NO GROWTH
SPECIAL REQUESTS: ADEQUATE
Special Requests: ADEQUATE

## 2018-12-29 DIAGNOSIS — D509 Iron deficiency anemia, unspecified: Secondary | ICD-10-CM | POA: Diagnosis not present

## 2018-12-29 DIAGNOSIS — N186 End stage renal disease: Secondary | ICD-10-CM | POA: Diagnosis not present

## 2018-12-29 DIAGNOSIS — Z992 Dependence on renal dialysis: Secondary | ICD-10-CM | POA: Diagnosis not present

## 2018-12-29 DIAGNOSIS — D631 Anemia in chronic kidney disease: Secondary | ICD-10-CM | POA: Diagnosis not present

## 2018-12-29 DIAGNOSIS — N2581 Secondary hyperparathyroidism of renal origin: Secondary | ICD-10-CM | POA: Diagnosis not present

## 2018-12-31 DIAGNOSIS — N186 End stage renal disease: Secondary | ICD-10-CM | POA: Diagnosis not present

## 2018-12-31 DIAGNOSIS — Z992 Dependence on renal dialysis: Secondary | ICD-10-CM | POA: Diagnosis not present

## 2018-12-31 DIAGNOSIS — D631 Anemia in chronic kidney disease: Secondary | ICD-10-CM | POA: Diagnosis not present

## 2018-12-31 DIAGNOSIS — D509 Iron deficiency anemia, unspecified: Secondary | ICD-10-CM | POA: Diagnosis not present

## 2018-12-31 DIAGNOSIS — N2581 Secondary hyperparathyroidism of renal origin: Secondary | ICD-10-CM | POA: Diagnosis not present

## 2019-01-02 ENCOUNTER — Other Ambulatory Visit: Payer: Self-pay

## 2019-01-02 ENCOUNTER — Encounter (HOSPITAL_COMMUNITY): Payer: Self-pay

## 2019-01-02 ENCOUNTER — Other Ambulatory Visit (HOSPITAL_COMMUNITY)
Admission: RE | Admit: 2019-01-02 | Discharge: 2019-01-02 | Disposition: A | Discharge status: Home / Self Care | Payer: Medicare Other | Source: Ambulatory Visit | Attending: Medical | Admitting: Medical

## 2019-01-02 ENCOUNTER — Emergency Department (HOSPITAL_COMMUNITY)
Admission: EM | Admit: 2019-01-02 | Discharge: 2019-01-02 | Disposition: A | Discharge status: Home / Self Care | Payer: Medicare Other | Attending: Emergency Medicine | Admitting: Emergency Medicine

## 2019-01-02 DIAGNOSIS — D649 Anemia, unspecified: Secondary | ICD-10-CM | POA: Insufficient documentation

## 2019-01-02 DIAGNOSIS — Z992 Dependence on renal dialysis: Secondary | ICD-10-CM | POA: Insufficient documentation

## 2019-01-02 DIAGNOSIS — N186 End stage renal disease: Secondary | ICD-10-CM | POA: Diagnosis not present

## 2019-01-02 DIAGNOSIS — N2581 Secondary hyperparathyroidism of renal origin: Secondary | ICD-10-CM | POA: Diagnosis not present

## 2019-01-02 DIAGNOSIS — Z7984 Long term (current) use of oral hypoglycemic drugs: Secondary | ICD-10-CM | POA: Diagnosis not present

## 2019-01-02 DIAGNOSIS — Z79899 Other long term (current) drug therapy: Secondary | ICD-10-CM | POA: Diagnosis not present

## 2019-01-02 DIAGNOSIS — D631 Anemia in chronic kidney disease: Secondary | ICD-10-CM | POA: Diagnosis not present

## 2019-01-02 DIAGNOSIS — E1122 Type 2 diabetes mellitus with diabetic chronic kidney disease: Secondary | ICD-10-CM | POA: Diagnosis not present

## 2019-01-02 DIAGNOSIS — I120 Hypertensive chronic kidney disease with stage 5 chronic kidney disease or end stage renal disease: Secondary | ICD-10-CM | POA: Diagnosis not present

## 2019-01-02 DIAGNOSIS — I5033 Acute on chronic diastolic (congestive) heart failure: Secondary | ICD-10-CM | POA: Diagnosis not present

## 2019-01-02 DIAGNOSIS — I132 Hypertensive heart and chronic kidney disease with heart failure and with stage 5 chronic kidney disease, or end stage renal disease: Secondary | ICD-10-CM | POA: Insufficient documentation

## 2019-01-02 DIAGNOSIS — Z853 Personal history of malignant neoplasm of breast: Secondary | ICD-10-CM | POA: Insufficient documentation

## 2019-01-02 DIAGNOSIS — D509 Iron deficiency anemia, unspecified: Secondary | ICD-10-CM | POA: Diagnosis not present

## 2019-01-02 LAB — BASIC METABOLIC PANEL
ANION GAP: 14 (ref 5–15)
BUN: 17 mg/dL (ref 6–20)
CO2: 27 mmol/L (ref 22–32)
Calcium: 9.7 mg/dL (ref 8.9–10.3)
Chloride: 99 mmol/L (ref 98–111)
Creatinine, Ser: 4.75 mg/dL — ABNORMAL HIGH (ref 0.44–1.00)
GFR calc Af Amer: 12 mL/min — ABNORMAL LOW (ref >60)
GFR calc non Af Amer: 10 mL/min — ABNORMAL LOW (ref >60)
Glucose, Bld: 304 mg/dL — ABNORMAL HIGH (ref 70–99)
Potassium: 3.3 mmol/L — ABNORMAL LOW (ref 3.5–5.1)
Sodium: 140 mmol/L (ref 135–145)

## 2019-01-02 LAB — CBC
HCT: 27.8 % — ABNORMAL LOW (ref 36.0–46.0)
Hemoglobin: 8.1 g/dL — ABNORMAL LOW (ref 12.0–15.0)
MCH: 27 pg (ref 26.0–34.0)
MCHC: 29.1 g/dL — ABNORMAL LOW (ref 30.0–36.0)
MCV: 92.7 fL (ref 80.0–100.0)
NRBC: 0 % (ref 0.0–0.2)
Platelets: 356 10*3/uL (ref 150–400)
RBC: 3 MIL/uL — AB (ref 3.87–5.11)
RDW: 17 % — ABNORMAL HIGH (ref 11.5–15.5)
WBC: 11.1 10*3/uL — ABNORMAL HIGH (ref 4.0–10.5)

## 2019-01-02 LAB — HEMOGLOBIN: Hemoglobin: 7.6 g/dL — ABNORMAL LOW (ref 12.0–15.0)

## 2019-01-02 LAB — TYPE AND SCREEN
ABO/RH(D): O POS
Antibody Screen: NEGATIVE

## 2019-01-02 NOTE — Discharge Instructions (Addendum)
Hemoglobin today coming in above 8.  It does appear that you have been borderline the last several days.  Did not meet threshold unfortunately today for blood transfusion.  Follow back up with your dialysis provider as well as your primary care provider.  The Epogen shot that she received may help keep your hemoglobin above 8.  Return for any new or worse symptoms.

## 2019-01-02 NOTE — ED Notes (Addendum)
Pt reports a history of anemia as well as recent excessive bleeding when being disconnected from dialysis   She denies bloody stools, and reports a low HGB historically   Also reports that she waas being worked up for kidney transplant 3but was dropped when she was Dx'd with breast CA

## 2019-01-02 NOTE — ED Triage Notes (Signed)
Pt was called by her doctor and stated Hemoglobin was 7.6. Pt is weak. Has not noticed any bleeding. Stated this stemmed from when they pulled out her dialysis needle out 2 weeks ago. At this time Hg was 8.8.

## 2019-01-02 NOTE — ED Provider Notes (Signed)
[facility] EMERGENCY DEPARTMENT Provider Note   CSN: [id number] Arrival date & time: 01/02/19  1246     History   Chief Complaint Chief Complaint  Patient presents with  . Abnormal Lab    HPI Megahn Killings is a 48 y.o. female.  Patient is a dialysis patient normally dialyzed Monday Wednesdays Fridays.  Patient sent in for low hemoglobin.  Patient apparently had some hemoglobins in the 7 range on and off.  Patient was given an Epogen shot today patient was dialyzed today.  Patient had a hemoglobin of 7.6 prior to dialysis.  Patient has not noticed any bleeding.  Does not feel as if she is going to pass out.  Patient feels she had some bleeding from her dialysis site at the beginning of the month.  But that is now controlled.  Patient was dialyzed today.     Past Medical History:  Diagnosis Date  . Anemia   . Ankle fracture   . Arthritis   . Blood transfusion without reported diagnosis   . Breast cancer ([place])   . Cancer ([place])   . Diabetes mellitus without complication ([place])   . Dialysis patient ([place])    mon, wed, friday,   . End stage renal disease on dialysis [facility])    M/W/F [facility] in [place]  . GERD (gastroesophageal reflux disease)   . Hypertension   . OSA (obstructive sleep apnea)    uses CPAP sometimes  . Pneumonia   . PONV (postoperative nausea and vomiting)   . Wears glasses     Patient Active Problem List   Diagnosis Date Noted  . Closed right ankle fracture 10/06/2018  . Closed displaced trimalleolar fracture of right ankle 10/06/2018  . Acute colitis 02/26/2018  . Hypoxemia 02/24/2018  . Dyspnea 11/26/2017  . Anemia 11/26/2017  . Elevated troponin 11/26/2017  . Hyperkalemia 09/29/2017  . Acute respiratory failure ([place]) 09/28/2017  . CAP (community acquired pneumonia) 09/26/2017  . Volume overload 09/25/2017  . Acute on chronic diastolic CHF (congestive heart failure) ([place]) 09/25/2017  . Lactic acidosis 09/25/2017  . Hypokalemia 09/25/2017  .  Breast cancer ([place]) 07/22/2017  . SOB (shortness of breath)   . Acute respiratory failure with hypoxia ([place]) 07/21/2017  . Flatulence 10/25/2016  . GERD (gastroesophageal reflux disease) 08/02/2016  . Pyloric stenosis, acquired   . Nausea with vomiting   . Generalized abdominal pain   . Chest pain, rule out acute myocardial infarction 07/04/2016  . Constipation 07/04/2016  . Nausea 07/04/2016  . Essential hypertension 07/04/2016  . HCAP (healthcare-associated pneumonia) 12/04/2015  . End stage renal disease on dialysis ([place]) 12/04/2015  . Diabetes mellitus ([place]) 12/04/2015    Past Surgical History:  Procedure Laterality Date  . ABDOMINAL HYSTERECTOMY    . AV FISTULA PLACEMENT  11/2014   at [place] N/A 07/10/2016   Procedure: BALLOON DILATION;  Surgeon: Danie Binder, MD;  Location[facility] ENDO [facility];  Service: Endoscopy;  Laterality: N/A;  Pyloric dilation  . BREAST LUMPECTOMY    . CESAREAN SECTION    . CHOLECYSTECTOMY    . COLONOSCOPY WITH PROPOFOL N/A 09/27/2016   Dr. Gala Romney: Internal hemorrhoids repeat colonoscopy in 10 years  . DILATION AND CURETTAGE OF UTERUS    . ESOPHAGOGASTRODUODENOSCOPY N/A 07/10/2016   Dr.Fields- normal esophagus, gastric stenosis was found at the pylorus, gastritis on bx, normal examined duodenun  . EXTERNAL FIXATION REMOVAL Right 10/29/2018   Procedure: REMOVAL RIGHT ANKLE BIOMET ZIMMER EXTERNAL FIXATOR,  SHORT LEG CAST APPLICATION;  Surgeon: Marybelle Killings, MD;  Location: [place];  Service: Orthopedics;  Laterality: Right;  . MASTECTOMY     left sided  . ORIF ANKLE FRACTURE Right 10/06/2018   Procedure: OPEN REDUCTION INTERNAL FIXATION (ORIF) RIGHT ANKLE TRIMALLEOLAR;  Surgeon: Marybelle Killings, MD;  Location: [place];  Service: Orthopedics;  Laterality: Right;     OB History    Gravida  3   Para  2   Term  1   Preterm  1   AB  1   Living        SAB  1   TAB      Ectopic      Multiple      Live Births                Home Medications    Prior to Admission medications   Medication Sig Start Date End Date Taking? Authorizing Provider  albuterol (PROVENTIL HFA;VENTOLIN HFA) 108 (90 Base) MCG/ACT inhaler Inhale 2 puffs into the lungs every 6 (six) hours as needed for wheezing or shortness of breath. 11/28/17   Kathie Dike, MD  albuterol (PROVENTIL) (2.5 MG/3ML) 0.083% nebulizer solution Take 3 mLs (2.5 mg total) by nebulization every 6 (six) hours as needed for wheezing or shortness of breath. 11/28/17   Kathie Dike, MD  cinacalcet (SENSIPAR) 30 MG tablet Take 30 mg by mouth every evening.     [provider]  doxycycline (VIBRAMYCIN) 100 MG capsule Take 1 capsule (100 mg total) by mouth 2 (two) times daily. 12/22/18   IdolAlmyra Free, PA-C  Ferric Citrate (AURYXIA) 1 GM 210 MG(Fe) TABS Take 2 tablets by mouth 2 (two) times daily with a meal. With Breakfast & with supper    [provider]  fluticasone (FLONASE) 50 MCG/ACT nasal spray Place 2 sprays into both nostrils daily as needed for allergies or rhinitis.  08/08/17   [provider]  furosemide (LASIX) 40 MG tablet Take 40-80 mg by mouth 2 (two) times daily. Take 2 tablets (80mg ) in the morning and take 1 tablet (40mg ) at bedtime    [provider]  glipiZIDE (GLUCOTROL) 5 MG tablet Take 5 mg by mouth 2 (two) times daily as needed. For blood sugar levels over 150    [provider]  linaclotide (LINZESS) 290 MCG CAPS capsule Take 1 capsule (290 mcg total) by mouth daily before breakfast. 10/24/18   Mahala Menghini, PA-C  multivitamin (RENA-VIT) TABS tablet Take 1 tablet by mouth daily.    [provider]  NIFEdipine (PROCARDIA-XL/ADALAT CC) 60 MG 24 hr tablet Take 1 tablet (60 mg total) by mouth daily. Patient taking differently: Take 60 mg by mouth every evening.  02/28/18   Kathie Dike, MD  ondansetron (ZOFRAN-ODT) 4 MG disintegrating tablet Take 4 mg by mouth every 8 (eight) hours as needed for  nausea or vomiting.    [provider]  oxyCODONE-acetaminophen (PERCOCET) 7.5-325 MG tablet Take 1 tablet by mouth every 6 (six) hours as needed for severe pain. 10/29/18   Lanae Crumbly, PA-C  pantoprazole (PROTONIX) 40 MG tablet Take 40 mg by mouth daily.    [provider]  polyethylene glycol (MIRALAX) packet Take 17 g by mouth daily. 10/14/18   Isla Pence, MD  rOPINIRole (REQUIP XL) 2 MG 24 hr tablet Take 2 mg by mouth at bedtime.  09/10/16   [provider]  sevelamer carbonate (RENVELA) 800 MG tablet Take 800  mg by mouth See admin instructions. Take 2 tablets (1600 mg) in the morning, take 3 tablets (2400 mg) with lunch or snack,  and take 2 tablets (1600 mg) by mouth in the evening with dinner meal.    [provider]    Family History Family History  Problem Relation Age of Onset  . Diabetes Mellitus II Mother   . Hypertension Mother   . Hypertension Sister   . Hypertension Sister   . Colon cancer Neg Hx     Social History Social History   Tobacco Use  . Smoking status: Never Smoker  . Smokeless tobacco: Never Used  Substance Use Topics  . Alcohol use: No    Alcohol/week: 0.0 standard drinks  . Drug use: No     Allergies   Amlodipine besylate and Reglan [metoclopramide]   Review of Systems Review of Systems  Constitutional: Negative for chills and fever.  HENT: Negative for rhinorrhea and sore throat.   Eyes: Negative for visual disturbance.  Respiratory: Negative for cough and shortness of breath.   Cardiovascular: Negative for chest pain and leg swelling.  Gastrointestinal: Negative for abdominal pain, diarrhea, nausea and vomiting.  Genitourinary: Negative for dysuria.  Musculoskeletal: Negative for back pain and neck pain.  Skin: Negative for rash.  Neurological: Negative for dizziness, syncope, light-headedness and headaches.  Hematological: Bruises/bleeds easily.  Psychiatric/Behavioral: Negative for confusion.       Physical Exam Updated Vital Signs BP (!) 161/74 (BP Location: Right Arm)   Pulse 95   Temp 98.8 F (37.1 C) (Oral)   Resp 14   SpO2 98%   Physical Exam Vitals signs and nursing note reviewed.  Constitutional:      General: She is not in acute distress.    Appearance: She is well-developed.  HENT:     Head: Normocephalic and atraumatic.     Mouth/Throat:     Mouth: Mucous membranes are moist.  Eyes:     Conjunctiva/sclera: Conjunctivae normal.  Neck:     Musculoskeletal: Neck supple.  Cardiovascular:     Rate and Rhythm: Normal rate and regular rhythm.     Heart sounds: No murmur.  Pulmonary:     Effort: Pulmonary effort is normal. No respiratory distress.     Breath sounds: Normal breath sounds. No wheezing or rales.  Abdominal:     Palpations: Abdomen is soft.     Tenderness: There is no abdominal tenderness.  Musculoskeletal: Normal range of motion.     Comments: AV fistula left arm.  Good thrill.  Patient with a walking boot on her right leg area secondary to an ankle fracture.  Skin:    General: Skin is warm and dry.     Capillary Refill: Capillary refill takes less than 2 seconds.  Neurological:     General: No focal deficit present.     Mental Status: She is alert and oriented to person, place, and time.      ED Treatments / Results  Labs (all labs ordered are listed, but only abnormal results are displayed) Labs Reviewed  CBC - Abnormal; Notable for the following components:      Result Value   WBC 11.1 (*)    RBC 3.00 (*)    Hemoglobin 8.1 (*)    HCT 27.8 (*)    MCHC 29.1 (*)    RDW 17.0 (*)    All other components within normal limits  BASIC METABOLIC PANEL - Abnormal; Notable for the following components:  Potassium 3.3 (*)    Glucose, Bld 304 (*)    Creatinine, Ser 4.75 (*)    GFR calc non Af Amer 10 (*)    GFR calc Af Amer 12 (*)    All other components within normal limits  TYPE AND SCREEN    EKG None  Radiology No results  found.  Procedures Procedures (including critical care time)  Medications Ordered in ED Medications - No data to display   Initial Impression / Assessment and Plan / ED Course  I have reviewed the triage vital signs and the nursing notes.  Pertinent labs & imaging results that were available during my care of the patient were reviewed by me and considered in my medical decision making (see chart for details).    Patient's hemoglobin today on recheck was 8.1.  Could be due to a little hemoconcentration from being dialyzed.  Patient's hemoglobin was 7.6 prior to dialysis and the one we got here this afternoon was 8.1.  Patient also did receive an Epogen shot.  Discussed with patient does not meet threshold for dialysis today however she is very close.  It will have to be followed very carefully.  Do understand at times she has been below 8.  But with her hemoglobin being 8.1 today and no significant symptoms patient not qualified for blood transfusion at this time.  She will continue dialysis Monday Wednesdays and Fridays.   Final Clinical Impressions(s) / ED Diagnoses   Final diagnoses:  Anemia, unspecified type  End stage renal failure on dialysis [facility])    ED Discharge Orders    None       Fredia Sorrow, MD 01/02/19 1606

## 2019-01-05 DIAGNOSIS — N186 End stage renal disease: Secondary | ICD-10-CM | POA: Diagnosis not present

## 2019-01-05 DIAGNOSIS — D631 Anemia in chronic kidney disease: Secondary | ICD-10-CM | POA: Diagnosis not present

## 2019-01-05 DIAGNOSIS — N2581 Secondary hyperparathyroidism of renal origin: Secondary | ICD-10-CM | POA: Diagnosis not present

## 2019-01-05 DIAGNOSIS — Z992 Dependence on renal dialysis: Secondary | ICD-10-CM | POA: Diagnosis not present

## 2019-01-05 DIAGNOSIS — D509 Iron deficiency anemia, unspecified: Secondary | ICD-10-CM | POA: Diagnosis not present

## 2019-01-07 DIAGNOSIS — N186 End stage renal disease: Secondary | ICD-10-CM | POA: Diagnosis not present

## 2019-01-07 DIAGNOSIS — D509 Iron deficiency anemia, unspecified: Secondary | ICD-10-CM | POA: Diagnosis not present

## 2019-01-07 DIAGNOSIS — D631 Anemia in chronic kidney disease: Secondary | ICD-10-CM | POA: Diagnosis not present

## 2019-01-07 DIAGNOSIS — N2581 Secondary hyperparathyroidism of renal origin: Secondary | ICD-10-CM | POA: Diagnosis not present

## 2019-01-07 DIAGNOSIS — Z992 Dependence on renal dialysis: Secondary | ICD-10-CM | POA: Diagnosis not present

## 2019-01-09 ENCOUNTER — Other Ambulatory Visit (HOSPITAL_COMMUNITY)
Admission: RE | Admit: 2019-01-09 | Discharge: 2019-01-09 | Disposition: A | Discharge status: Home / Self Care | Payer: Medicare Other | Source: Ambulatory Visit | Attending: Medical | Admitting: Medical

## 2019-01-09 DIAGNOSIS — D649 Anemia, unspecified: Secondary | ICD-10-CM | POA: Diagnosis not present

## 2019-01-09 DIAGNOSIS — N186 End stage renal disease: Secondary | ICD-10-CM | POA: Diagnosis not present

## 2019-01-09 DIAGNOSIS — Z992 Dependence on renal dialysis: Secondary | ICD-10-CM | POA: Diagnosis not present

## 2019-01-09 DIAGNOSIS — D631 Anemia in chronic kidney disease: Secondary | ICD-10-CM | POA: Diagnosis not present

## 2019-01-09 DIAGNOSIS — N2581 Secondary hyperparathyroidism of renal origin: Secondary | ICD-10-CM | POA: Diagnosis not present

## 2019-01-09 DIAGNOSIS — D509 Iron deficiency anemia, unspecified: Secondary | ICD-10-CM | POA: Diagnosis not present

## 2019-01-09 LAB — HEMOGLOBIN: Hemoglobin: 8.2 g/dL — ABNORMAL LOW (ref 12.0–15.0)

## 2019-01-12 DIAGNOSIS — D509 Iron deficiency anemia, unspecified: Secondary | ICD-10-CM | POA: Diagnosis not present

## 2019-01-12 DIAGNOSIS — N2581 Secondary hyperparathyroidism of renal origin: Secondary | ICD-10-CM | POA: Diagnosis not present

## 2019-01-12 DIAGNOSIS — N186 End stage renal disease: Secondary | ICD-10-CM | POA: Diagnosis not present

## 2019-01-12 DIAGNOSIS — Z992 Dependence on renal dialysis: Secondary | ICD-10-CM | POA: Diagnosis not present

## 2019-01-12 DIAGNOSIS — D631 Anemia in chronic kidney disease: Secondary | ICD-10-CM | POA: Diagnosis not present

## 2019-01-14 DIAGNOSIS — D509 Iron deficiency anemia, unspecified: Secondary | ICD-10-CM | POA: Diagnosis not present

## 2019-01-14 DIAGNOSIS — N186 End stage renal disease: Secondary | ICD-10-CM | POA: Diagnosis not present

## 2019-01-14 DIAGNOSIS — D631 Anemia in chronic kidney disease: Secondary | ICD-10-CM | POA: Diagnosis not present

## 2019-01-14 DIAGNOSIS — N2581 Secondary hyperparathyroidism of renal origin: Secondary | ICD-10-CM | POA: Diagnosis not present

## 2019-01-14 DIAGNOSIS — Z992 Dependence on renal dialysis: Secondary | ICD-10-CM | POA: Diagnosis not present

## 2019-01-16 ENCOUNTER — Ambulatory Visit (INDEPENDENT_AMBULATORY_CARE_PROVIDER_SITE_OTHER): Payer: Medicare Other

## 2019-01-16 ENCOUNTER — Encounter (INDEPENDENT_AMBULATORY_CARE_PROVIDER_SITE_OTHER): Payer: Self-pay | Admitting: Orthopaedic Surgery

## 2019-01-16 ENCOUNTER — Ambulatory Visit (INDEPENDENT_AMBULATORY_CARE_PROVIDER_SITE_OTHER): Payer: Medicare Other | Admitting: Orthopaedic Surgery

## 2019-01-16 VITALS — BP 160/83 | HR 93 | Ht 63.0 in | Wt 246.0 lb

## 2019-01-16 DIAGNOSIS — S82851A Displaced trimalleolar fracture of right lower leg, initial encounter for closed fracture: Secondary | ICD-10-CM | POA: Diagnosis not present

## 2019-01-16 DIAGNOSIS — N2581 Secondary hyperparathyroidism of renal origin: Secondary | ICD-10-CM | POA: Diagnosis not present

## 2019-01-16 DIAGNOSIS — N186 End stage renal disease: Secondary | ICD-10-CM | POA: Diagnosis not present

## 2019-01-16 DIAGNOSIS — Z992 Dependence on renal dialysis: Secondary | ICD-10-CM | POA: Diagnosis not present

## 2019-01-16 DIAGNOSIS — D631 Anemia in chronic kidney disease: Secondary | ICD-10-CM | POA: Diagnosis not present

## 2019-01-16 DIAGNOSIS — D509 Iron deficiency anemia, unspecified: Secondary | ICD-10-CM | POA: Diagnosis not present

## 2019-01-16 NOTE — Progress Notes (Signed)
Office Visit Note   Patient: Patricia [name]           Date of Birth: Mar 11, 1971           MRN: [id number] Visit Date: 01/16/2019              Requested by: Jake Samples, PA-C [address] [place], [place] [address] PCP: Jake Samples, PA-C   Assessment & Plan: Visit Diagnoses:  1. Closed displaced trimalleolar fracture of right ankle, initial encounter     Plan: DC cam boot patient can ambulate with her tennis shoe. Follow-Up Instructions: No follow-ups on file.   Orders:  Orders Placed This Encounter  Procedures  . XR Ankle Complete Right   No orders of the defined types were placed in this encounter.     Procedures: No procedures performed   Clinical Data: No additional findings.   Subjective: Chief Complaint  Patient presents with  . 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  She had comminution of the fibula and plate with multiple distal screw options was used.  Fibula is healed minimally Ellis fracture had 2 separate fragments and is also consolidated nicely.  Lag screw for the posterior malleolar fragment.  Review of Systems updated unchanged from surgery.   Objective: Vital Signs: BP (!) 160/83   Pulse 93   Ht 5\' 3"  (1.6 m)   Wt 246 lb (111.6 kg)   BMI 43.58 kg/m   Physical Exam Constitutional:      Appearance: She is well-developed.  HENT:     Head: Normocephalic.     Right Ear: External ear normal.     Left Ear: External ear normal.  Eyes:     Pupils: Pupils are equal, round, and reactive to light.  Neck:     Thyroid: No thyromegaly.     Trachea: No tracheal deviation.  Cardiovascular:     Rate and Rhythm: Normal rate.  Pulmonary:     Effort: Pulmonary effort is normal.  Abdominal:     Palpations: Abdomen is soft.  Skin:    General: Skin is warm and dry.  Neurological:     Mental Status: She is alert and oriented  to person, place, and time.  Psychiatric:        Behavior: Behavior normal.     Ortho Exam patient has healed medial and lateral incisions.  She has 75% dorsiflexion plantarflexion compared to the opposite ankle without pain.  Specialty Comments:  No specialty comments available.  Imaging: Xr Ankle Complete Right  Result Date: 01/16/2019 Three-view x-rays right ankle AP lateral mortise view obtained and reviewed.  This shows trimalleolar ankle fixation with interval healing.  Medial malleolus was segmental fracture.  Ankle joint is well reduced. Impression: Interval healing satisfactory trimalleolar right ankle fracture.    PMFS History: Patient Active Problem List   Diagnosis Date Noted  . Closed right ankle fracture 10/06/2018  . Closed displaced trimalleolar fracture of right ankle 10/06/2018  . Acute colitis 02/26/2018  . Hypoxemia 02/24/2018  . Dyspnea 11/26/2017  . Anemia 11/26/2017  . Elevated troponin 11/26/2017  . Hyperkalemia 09/29/2017  . Acute respiratory failure ([place]) 09/28/2017  . CAP (community acquired pneumonia) 09/26/2017  . Volume overload 09/25/2017  . Acute on chronic diastolic CHF (congestive heart failure) ([place]) 09/25/2017  . Lactic acidosis 09/25/2017  . Hypokalemia 09/25/2017  . Breast cancer ([place]) 07/22/2017  . SOB (  shortness of breath)   . Acute respiratory failure with hypoxia ([place]) 07/21/2017  . Flatulence 10/25/2016  . GERD (gastroesophageal reflux disease) 08/02/2016  . Pyloric stenosis, acquired   . Nausea with vomiting   . Generalized abdominal pain   . Chest pain, rule out acute myocardial infarction 07/04/2016  . Constipation 07/04/2016  . Nausea 07/04/2016  . Essential hypertension 07/04/2016  . HCAP (healthcare-associated pneumonia) 12/04/2015  . End stage renal disease on dialysis ([place]) 12/04/2015  . Diabetes mellitus ([place]) 12/04/2015   Past Medical History:  Diagnosis Date  . Anemia   . Ankle fracture   . Arthritis   .  Blood transfusion without reported diagnosis   . Breast cancer ([place])   . Cancer ([place])   . Diabetes mellitus without complication ([place])   . Dialysis patient ([place])    mon, wed, friday,   . End stage renal disease on dialysis [facility])    M/W/F [facility] in [place]  . GERD (gastroesophageal reflux disease)   . Hypertension   . OSA (obstructive sleep apnea)    uses CPAP sometimes  . Pneumonia   . PONV (postoperative nausea and vomiting)   . Wears glasses     Family History  Problem Relation Age of Onset  . Diabetes Mellitus II Mother   . Hypertension Mother   . Hypertension Sister   . Hypertension Sister   . Colon cancer Neg Hx     Past Surgical History:  Procedure Laterality Date  . ABDOMINAL HYSTERECTOMY    . AV FISTULA PLACEMENT  11/2014   at [place] N/A 07/10/2016   Procedure: BALLOON DILATION;  Surgeon: Danie Binder, MD;  [facility];  Service: Endoscopy;  Laterality: N/A;  Pyloric dilation  . BREAST LUMPECTOMY    . CESAREAN SECTION    . CHOLECYSTECTOMY    . COLONOSCOPY WITH PROPOFOL N/A 09/27/2016   Dr. Gala Romney: Internal hemorrhoids repeat colonoscopy in 10 years  . DILATION AND CURETTAGE OF UTERUS    . ESOPHAGOGASTRODUODENOSCOPY N/A 07/10/2016   Dr.Fields- normal esophagus, gastric stenosis was found at the pylorus, gastritis on bx, normal examined duodenun  . EXTERNAL FIXATION REMOVAL Right 10/29/2018   Procedure: REMOVAL RIGHT ANKLE BIOMET ZIMMER EXTERNAL FIXATOR, SHORT LEG CAST APPLICATION;  Surgeon: Marybelle Killings, MD;  Location: [place];  Service: Orthopedics;  Laterality: Right;  . MASTECTOMY     left sided  . ORIF ANKLE FRACTURE Right 10/06/2018   Procedure: OPEN REDUCTION INTERNAL FIXATION (ORIF) RIGHT ANKLE TRIMALLEOLAR;  Surgeon: Marybelle Killings, MD;  Location: [place];  Service: Orthopedics;  Laterality: Right;   Social History   Occupational History  . Not on file  Tobacco Use  . Smoking status: Never Smoker  . Smokeless  tobacco: Never Used  Substance and Sexual Activity  . Alcohol use: No    Alcohol/week: 0.0 standard drinks  . Drug use: No  . Sexual activity: Not on file

## 2019-01-18 ENCOUNTER — Other Ambulatory Visit: Payer: Self-pay | Admitting: Gastroenterology

## 2019-01-19 DIAGNOSIS — D509 Iron deficiency anemia, unspecified: Secondary | ICD-10-CM | POA: Diagnosis not present

## 2019-01-19 DIAGNOSIS — D631 Anemia in chronic kidney disease: Secondary | ICD-10-CM | POA: Diagnosis not present

## 2019-01-19 DIAGNOSIS — N2581 Secondary hyperparathyroidism of renal origin: Secondary | ICD-10-CM | POA: Diagnosis not present

## 2019-01-19 DIAGNOSIS — N186 End stage renal disease: Secondary | ICD-10-CM | POA: Diagnosis not present

## 2019-01-19 DIAGNOSIS — Z992 Dependence on renal dialysis: Secondary | ICD-10-CM | POA: Diagnosis not present

## 2019-01-21 DIAGNOSIS — Z992 Dependence on renal dialysis: Secondary | ICD-10-CM | POA: Diagnosis not present

## 2019-01-21 DIAGNOSIS — D509 Iron deficiency anemia, unspecified: Secondary | ICD-10-CM | POA: Diagnosis not present

## 2019-01-21 DIAGNOSIS — D631 Anemia in chronic kidney disease: Secondary | ICD-10-CM | POA: Diagnosis not present

## 2019-01-21 DIAGNOSIS — N2581 Secondary hyperparathyroidism of renal origin: Secondary | ICD-10-CM | POA: Diagnosis not present

## 2019-01-21 DIAGNOSIS — N186 End stage renal disease: Secondary | ICD-10-CM | POA: Diagnosis not present

## 2019-01-23 DIAGNOSIS — D509 Iron deficiency anemia, unspecified: Secondary | ICD-10-CM | POA: Diagnosis not present

## 2019-01-23 DIAGNOSIS — Z992 Dependence on renal dialysis: Secondary | ICD-10-CM | POA: Diagnosis not present

## 2019-01-23 DIAGNOSIS — N2581 Secondary hyperparathyroidism of renal origin: Secondary | ICD-10-CM | POA: Diagnosis not present

## 2019-01-23 DIAGNOSIS — N186 End stage renal disease: Secondary | ICD-10-CM | POA: Diagnosis not present

## 2019-01-23 DIAGNOSIS — D631 Anemia in chronic kidney disease: Secondary | ICD-10-CM | POA: Diagnosis not present

## 2019-01-26 DIAGNOSIS — D509 Iron deficiency anemia, unspecified: Secondary | ICD-10-CM | POA: Diagnosis not present

## 2019-01-26 DIAGNOSIS — Z992 Dependence on renal dialysis: Secondary | ICD-10-CM | POA: Diagnosis not present

## 2019-01-26 DIAGNOSIS — D631 Anemia in chronic kidney disease: Secondary | ICD-10-CM | POA: Diagnosis not present

## 2019-01-26 DIAGNOSIS — N2581 Secondary hyperparathyroidism of renal origin: Secondary | ICD-10-CM | POA: Diagnosis not present

## 2019-01-26 DIAGNOSIS — N186 End stage renal disease: Secondary | ICD-10-CM | POA: Diagnosis not present

## 2019-01-28 DIAGNOSIS — D509 Iron deficiency anemia, unspecified: Secondary | ICD-10-CM | POA: Diagnosis not present

## 2019-01-28 DIAGNOSIS — Z992 Dependence on renal dialysis: Secondary | ICD-10-CM | POA: Diagnosis not present

## 2019-01-28 DIAGNOSIS — N2581 Secondary hyperparathyroidism of renal origin: Secondary | ICD-10-CM | POA: Diagnosis not present

## 2019-01-28 DIAGNOSIS — N186 End stage renal disease: Secondary | ICD-10-CM | POA: Diagnosis not present

## 2019-01-28 DIAGNOSIS — D631 Anemia in chronic kidney disease: Secondary | ICD-10-CM | POA: Diagnosis not present

## 2019-01-29 ENCOUNTER — Ambulatory Visit (HOSPITAL_COMMUNITY)
Admission: RE | Admit: 2019-01-29 | Discharge: 2019-01-29 | Disposition: A | Discharge status: Home / Self Care | Payer: Medicare Other | Source: Ambulatory Visit | Attending: Family Medicine | Admitting: Family Medicine

## 2019-01-29 ENCOUNTER — Other Ambulatory Visit (HOSPITAL_COMMUNITY): Payer: Self-pay | Admitting: Family Medicine

## 2019-01-29 DIAGNOSIS — Z6841 Body Mass Index (BMI) 40.0 and over, adult: Secondary | ICD-10-CM | POA: Diagnosis not present

## 2019-01-29 DIAGNOSIS — J069 Acute upper respiratory infection, unspecified: Secondary | ICD-10-CM | POA: Insufficient documentation

## 2019-01-29 DIAGNOSIS — E119 Type 2 diabetes mellitus without complications: Secondary | ICD-10-CM | POA: Diagnosis not present

## 2019-01-29 DIAGNOSIS — Z992 Dependence on renal dialysis: Secondary | ICD-10-CM | POA: Diagnosis not present

## 2019-01-30 DIAGNOSIS — N2581 Secondary hyperparathyroidism of renal origin: Secondary | ICD-10-CM | POA: Diagnosis not present

## 2019-01-30 DIAGNOSIS — D631 Anemia in chronic kidney disease: Secondary | ICD-10-CM | POA: Diagnosis not present

## 2019-01-30 DIAGNOSIS — N186 End stage renal disease: Secondary | ICD-10-CM | POA: Diagnosis not present

## 2019-01-30 DIAGNOSIS — D509 Iron deficiency anemia, unspecified: Secondary | ICD-10-CM | POA: Diagnosis not present

## 2019-01-30 DIAGNOSIS — Z992 Dependence on renal dialysis: Secondary | ICD-10-CM | POA: Diagnosis not present

## 2019-02-02 DIAGNOSIS — D631 Anemia in chronic kidney disease: Secondary | ICD-10-CM | POA: Diagnosis not present

## 2019-02-02 DIAGNOSIS — Z992 Dependence on renal dialysis: Secondary | ICD-10-CM | POA: Diagnosis not present

## 2019-02-02 DIAGNOSIS — D509 Iron deficiency anemia, unspecified: Secondary | ICD-10-CM | POA: Diagnosis not present

## 2019-02-02 DIAGNOSIS — N186 End stage renal disease: Secondary | ICD-10-CM | POA: Diagnosis not present

## 2019-02-02 DIAGNOSIS — N2581 Secondary hyperparathyroidism of renal origin: Secondary | ICD-10-CM | POA: Diagnosis not present

## 2019-02-04 DIAGNOSIS — D631 Anemia in chronic kidney disease: Secondary | ICD-10-CM | POA: Diagnosis not present

## 2019-02-04 DIAGNOSIS — E119 Type 2 diabetes mellitus without complications: Secondary | ICD-10-CM | POA: Diagnosis not present

## 2019-02-04 DIAGNOSIS — N2581 Secondary hyperparathyroidism of renal origin: Secondary | ICD-10-CM | POA: Diagnosis not present

## 2019-02-04 DIAGNOSIS — D509 Iron deficiency anemia, unspecified: Secondary | ICD-10-CM | POA: Diagnosis not present

## 2019-02-04 DIAGNOSIS — N186 End stage renal disease: Secondary | ICD-10-CM | POA: Diagnosis not present

## 2019-02-04 DIAGNOSIS — Z992 Dependence on renal dialysis: Secondary | ICD-10-CM | POA: Diagnosis not present

## 2019-02-05 ENCOUNTER — Encounter: Payer: Self-pay | Admitting: Nurse Practitioner

## 2019-02-05 ENCOUNTER — Ambulatory Visit (INDEPENDENT_AMBULATORY_CARE_PROVIDER_SITE_OTHER): Payer: Medicare Other | Admitting: Nurse Practitioner

## 2019-02-05 VITALS — BP 161/81 | HR 96 | Temp 97.8°F | Ht 63.0 in | Wt 251.6 lb

## 2019-02-05 DIAGNOSIS — K5909 Other constipation: Secondary | ICD-10-CM

## 2019-02-05 DIAGNOSIS — R143 Flatulence: Secondary | ICD-10-CM

## 2019-02-05 MED ORDER — LINACLOTIDE 290 MCG PO CAPS: 290.0000 ug | ORAL_CAPSULE | Freq: Every day | ORAL | 5 refills | Status: DC

## 2019-02-05 NOTE — Assessment & Plan Note (Signed)
Constipation previously not responsive to Linzess.  However, the patient started taking medications based on previously given directions and Linzess is now effective.  Recommend she continue Linzess and follow-up in 4 months.
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
## 2019-02-05 NOTE — Progress Notes (Signed)
CC'D TO PCP °

## 2019-02-05 NOTE — Patient Instructions (Signed)
Your health issues we discussed today were:   Constipation: 1. Continue taking Linzess 2. Call us if you have any worsening constipation  Gas and cramping during dialysis: 1. You can try avoiding broccoli and other toxins the day before and morning of hemodialysis 2. Discuss further with nephrology possible treatments given the limitations of your hemodialysis.  Overall I recommend:  1. Follow-up in 4 months 2. Call us for any questions or concerns.  At [facility] we value your feedback. You may receive a survey about your visit today. Please share your experience as we strive to create trusting relationships with our patients to provide genuine, compassionate, quality care.  We appreciate your understanding and patience as we review any laboratory studies, imaging, and other diagnostic tests that are ordered as we care for you. Our office policy is 5 business days for review of these results, and any emergent or urgent results are addressed in a timely manner for your best interest. If you do not hear from our office in 1 week, please contact us.   We also encourage the use of MyChart, which contains your medical information for your review as well. If you are not enrolled in this feature, an access code is on this after visit summary for your convenience. Thank you for allowing [place] to be involved in your care.  It was great to see you today!  I hope you have a good day!!

## 2019-02-05 NOTE — Assessment & Plan Note (Signed)
The patient states that during hemodialysis she would develop gas and flatulence.  She is typically unable to pass gas and this causes abdominal cramping.  We would normally recommend simethicone, Beano however hemodialysis/nephrology has recommended she not take these.  They recommended Tums which is not effective.  Given her limitations based on her hemodialysis and kidney disease we will defer back to hemodialysis for management.  Continue Linzess.  Follow-up in 4 months.

## 2019-02-06 DIAGNOSIS — D631 Anemia in chronic kidney disease: Secondary | ICD-10-CM | POA: Diagnosis not present

## 2019-02-06 DIAGNOSIS — N186 End stage renal disease: Secondary | ICD-10-CM | POA: Diagnosis not present

## 2019-02-06 DIAGNOSIS — Z992 Dependence on renal dialysis: Secondary | ICD-10-CM | POA: Diagnosis not present

## 2019-02-06 DIAGNOSIS — D509 Iron deficiency anemia, unspecified: Secondary | ICD-10-CM | POA: Diagnosis not present

## 2019-02-06 DIAGNOSIS — N2581 Secondary hyperparathyroidism of renal origin: Secondary | ICD-10-CM | POA: Diagnosis not present

## 2019-02-09 DIAGNOSIS — N186 End stage renal disease: Secondary | ICD-10-CM | POA: Diagnosis not present

## 2019-02-09 DIAGNOSIS — D631 Anemia in chronic kidney disease: Secondary | ICD-10-CM | POA: Diagnosis not present

## 2019-02-09 DIAGNOSIS — Z992 Dependence on renal dialysis: Secondary | ICD-10-CM | POA: Diagnosis not present

## 2019-02-09 DIAGNOSIS — N2581 Secondary hyperparathyroidism of renal origin: Secondary | ICD-10-CM | POA: Diagnosis not present

## 2019-02-09 DIAGNOSIS — D509 Iron deficiency anemia, unspecified: Secondary | ICD-10-CM | POA: Diagnosis not present

## 2019-02-11 DIAGNOSIS — N2581 Secondary hyperparathyroidism of renal origin: Secondary | ICD-10-CM | POA: Diagnosis not present

## 2019-02-11 DIAGNOSIS — D509 Iron deficiency anemia, unspecified: Secondary | ICD-10-CM | POA: Diagnosis not present

## 2019-02-11 DIAGNOSIS — Z992 Dependence on renal dialysis: Secondary | ICD-10-CM | POA: Diagnosis not present

## 2019-02-11 DIAGNOSIS — D631 Anemia in chronic kidney disease: Secondary | ICD-10-CM | POA: Diagnosis not present

## 2019-02-11 DIAGNOSIS — N186 End stage renal disease: Secondary | ICD-10-CM | POA: Diagnosis not present

## 2019-02-13 ENCOUNTER — Ambulatory Visit (INDEPENDENT_AMBULATORY_CARE_PROVIDER_SITE_OTHER): Payer: Medicare Other

## 2019-02-13 ENCOUNTER — Ambulatory Visit (INDEPENDENT_AMBULATORY_CARE_PROVIDER_SITE_OTHER): Payer: Medicare Other | Admitting: Orthopaedic Surgery

## 2019-02-13 ENCOUNTER — Encounter (INDEPENDENT_AMBULATORY_CARE_PROVIDER_SITE_OTHER): Payer: Self-pay | Admitting: Orthopaedic Surgery

## 2019-02-13 VITALS — BP 148/74 | HR 79 | Ht 63.0 in | Wt 251.0 lb

## 2019-02-13 DIAGNOSIS — N2581 Secondary hyperparathyroidism of renal origin: Secondary | ICD-10-CM | POA: Diagnosis not present

## 2019-02-13 DIAGNOSIS — D631 Anemia in chronic kidney disease: Secondary | ICD-10-CM | POA: Diagnosis not present

## 2019-02-13 DIAGNOSIS — D509 Iron deficiency anemia, unspecified: Secondary | ICD-10-CM | POA: Diagnosis not present

## 2019-02-13 DIAGNOSIS — S82851A Displaced trimalleolar fracture of right lower leg, initial encounter for closed fracture: Secondary | ICD-10-CM

## 2019-02-13 DIAGNOSIS — N186 End stage renal disease: Secondary | ICD-10-CM | POA: Diagnosis not present

## 2019-02-13 DIAGNOSIS — Z992 Dependence on renal dialysis: Secondary | ICD-10-CM | POA: Diagnosis not present

## 2019-02-13 NOTE — Progress Notes (Signed)
Office Visit Note   Patient: Patricia [name]           Date of Birth: Feb 14, 1971           MRN: [id number] Visit Date: 02/13/2019              Requested by: Jake Samples, PA-C [address] [place], [place] [address] PCP: Jake Samples, PA-C   Assessment & Plan: Visit Diagnoses:  1. Closed displaced trimalleolar fracture of right ankle, initial encounter     Plan: We will place her in a single leg TED hose that she can wear to help with the swelling.  Continue ambulation and strengthening recheck 4 months for repeat films if she still having problems.  Follow-Up Instructions: Return in about 4 months (around 06/14/2019).   Orders:  Orders Placed This Encounter  Procedures  . XR Ankle Complete Right   No orders of the defined types were placed in this encounter.     Procedures: No procedures performed   Clinical Data: No additional findings.   Subjective: Chief Complaint  Patient presents with  . Right Ankle - Follow-up    10/06/2018 ORIF Right ankle trimalleolar fx    HPI follow-up trimalleolar ankle fracture in a dialysis patient.  She is had persistent problems with swelling when he gets real swelling she has some aching pain.  Fractures have healed.  After her original injury she had to be placed in a external fixator before surgery could be done due to massive swelling and fracture blisters.  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  She tries to prop her foot up during the day intermittently.  Gait is improved she does have diabetes.  Review of Systems Updated unchanged of note is end-stage renal disease on dialysis, diabetes, osteopenia, heart failure history of acute respiratory failure otherwise noncontributory.  Objective: Vital Signs: BP (!) 148/74   Pulse 79   Ht 5\' 3"  (1.6 m)   Wt 251 lb (113.9 kg)   BMI 44.46 kg/m   Physical Exam Constitutional:      Appearance: She is well-developed.  HENT:     Head: Normocephalic.    Right Ear: External ear normal.     Left Ear: External ear normal.  Eyes:     Pupils: Pupils are equal, round, and reactive to light.  Neck:     Thyroid: No thyromegaly.     Trachea: No tracheal deviation.  Cardiovascular:     Rate and Rhythm: Normal rate.  Pulmonary:     Effort: Pulmonary effort is normal.  Abdominal:     Palpations: Abdomen is soft.  Skin:    General: Skin is warm and dry.  Neurological:     Mental Status: She is alert and oriented to person, place, and time.  Psychiatric:        Behavior: Behavior normal.     Ortho Exam dialysis catheter upper extremity.  Healed areas where she had fracture blisters.  No tenderness laterally over the fibula she has some swelling over the ankle joint.  No tenderness medially over the medial malleolus. Specialty Comments:  No specialty comments available.  Imaging: No results found.   PMFS History: Patient Active Problem List   Diagnosis Date Noted  . Closed right ankle fracture 10/06/2018  . Closed displaced trimalleolar fracture of right ankle 10/06/2018  . Acute colitis 02/26/2018  . Hypoxemia 02/24/2018  . Dyspnea 11/26/2017  . Anemia 11/26/2017  . Elevated troponin 11/26/2017  .  Hyperkalemia 09/29/2017  . Acute respiratory failure ([place]) 09/28/2017  . CAP (community acquired pneumonia) 09/26/2017  . Volume overload 09/25/2017  . Acute on chronic diastolic CHF (congestive heart failure) ([place]) 09/25/2017  . Lactic acidosis 09/25/2017  . Hypokalemia 09/25/2017  . Breast cancer ([place]) 07/22/2017  . SOB (shortness of breath)   . Acute respiratory failure with hypoxia ([place]) 07/21/2017  . Flatulence 10/25/2016  . GERD (gastroesophageal reflux disease) 08/02/2016  . Pyloric stenosis, acquired   . Nausea with vomiting   . Generalized abdominal pain   . Chest pain, rule out acute myocardial infarction 07/04/2016  . Constipation 07/04/2016  . Nausea 07/04/2016  . Essential hypertension 07/04/2016  . HCAP  (healthcare-associated pneumonia) 12/04/2015  . End stage renal disease on dialysis ([place]) 12/04/2015  . Diabetes mellitus ([place]) 12/04/2015   Past Medical History:  Diagnosis Date  . Anemia   . Ankle fracture   . Arthritis   . Blood transfusion without reported diagnosis   . Breast cancer ([place])   . Cancer ([place])   . Diabetes mellitus without complication ([place])   . Dialysis patient ([place])    mon, wed, friday,   . End stage renal disease on dialysis [facility])    M/W/F [facility] in [place]  . GERD (gastroesophageal reflux disease)   . Hypertension   . OSA (obstructive sleep apnea)    uses CPAP sometimes  . Pneumonia   . PONV (postoperative nausea and vomiting)   . Wears glasses     Family History  Problem Relation Age of Onset  . Diabetes Mellitus II Mother   . Hypertension Mother   . Hypertension Sister   . Hypertension Sister   . Colon cancer Neg Hx     Past Surgical History:  Procedure Laterality Date  . ABDOMINAL HYSTERECTOMY    . AV FISTULA PLACEMENT  11/2014   at [place] N/A 07/10/2016   Procedure: BALLOON DILATION;  Surgeon: Danie Binder, MD;  [facility];  Service: Endoscopy;  Laterality: N/A;  Pyloric dilation  . BREAST LUMPECTOMY    . CESAREAN SECTION    . CHOLECYSTECTOMY    . COLONOSCOPY WITH PROPOFOL N/A 09/27/2016   Dr. Gala Romney: Internal hemorrhoids repeat colonoscopy in 10 years  . DILATION AND CURETTAGE OF UTERUS    . ESOPHAGOGASTRODUODENOSCOPY N/A 07/10/2016   Dr.Fields- normal esophagus, gastric stenosis was found at the pylorus, gastritis on bx, normal examined duodenun  . EXTERNAL FIXATION REMOVAL Right 10/29/2018   Procedure: REMOVAL RIGHT ANKLE BIOMET ZIMMER EXTERNAL FIXATOR, SHORT LEG CAST APPLICATION;  Surgeon: Marybelle Killings, MD;  Location: [facility];  Service: Orthopedics;  Laterality: Right;  . MASTECTOMY     left sided  . ORIF ANKLE FRACTURE Right 10/06/2018   Procedure: OPEN REDUCTION INTERNAL FIXATION (ORIF) RIGHT  ANKLE TRIMALLEOLAR;  Surgeon: Marybelle Killings, MD;  Location: [facility];  Service: Orthopedics;  Laterality: Right;   Social History   Occupational History  . Not on file  Tobacco Use  . Smoking status: Never Smoker  . Smokeless tobacco: Never Used  Substance and Sexual Activity  . Alcohol use: No    Alcohol/week: 0.0 standard drinks  . Drug use: No  . Sexual activity: Not on file

## 2019-02-14 DIAGNOSIS — Z992 Dependence on renal dialysis: Secondary | ICD-10-CM | POA: Diagnosis not present

## 2019-02-14 DIAGNOSIS — N186 End stage renal disease: Secondary | ICD-10-CM | POA: Diagnosis not present

## 2019-02-16 DIAGNOSIS — N186 End stage renal disease: Secondary | ICD-10-CM | POA: Diagnosis not present

## 2019-02-16 DIAGNOSIS — N2581 Secondary hyperparathyroidism of renal origin: Secondary | ICD-10-CM | POA: Diagnosis not present

## 2019-02-16 DIAGNOSIS — D631 Anemia in chronic kidney disease: Secondary | ICD-10-CM | POA: Diagnosis not present

## 2019-02-16 DIAGNOSIS — D509 Iron deficiency anemia, unspecified: Secondary | ICD-10-CM | POA: Diagnosis not present

## 2019-02-16 DIAGNOSIS — Z992 Dependence on renal dialysis: Secondary | ICD-10-CM | POA: Diagnosis not present

## 2019-02-18 DIAGNOSIS — H35033 Hypertensive retinopathy, bilateral: Secondary | ICD-10-CM | POA: Diagnosis not present

## 2019-02-18 DIAGNOSIS — E113513 Type 2 diabetes mellitus with proliferative diabetic retinopathy with macular edema, bilateral: Secondary | ICD-10-CM | POA: Diagnosis not present

## 2019-02-18 DIAGNOSIS — E11311 Type 2 diabetes mellitus with unspecified diabetic retinopathy with macular edema: Secondary | ICD-10-CM | POA: Diagnosis not present

## 2019-02-18 DIAGNOSIS — Z992 Dependence on renal dialysis: Secondary | ICD-10-CM | POA: Diagnosis not present

## 2019-02-18 DIAGNOSIS — H43821 Vitreomacular adhesion, right eye: Secondary | ICD-10-CM | POA: Diagnosis not present

## 2019-02-18 DIAGNOSIS — N186 End stage renal disease: Secondary | ICD-10-CM | POA: Diagnosis not present

## 2019-02-18 DIAGNOSIS — H25813 Combined forms of age-related cataract, bilateral: Secondary | ICD-10-CM | POA: Diagnosis not present

## 2019-02-18 DIAGNOSIS — D509 Iron deficiency anemia, unspecified: Secondary | ICD-10-CM | POA: Diagnosis not present

## 2019-02-18 DIAGNOSIS — N2581 Secondary hyperparathyroidism of renal origin: Secondary | ICD-10-CM | POA: Diagnosis not present

## 2019-02-18 DIAGNOSIS — D631 Anemia in chronic kidney disease: Secondary | ICD-10-CM | POA: Diagnosis not present

## 2019-02-20 DIAGNOSIS — Z992 Dependence on renal dialysis: Secondary | ICD-10-CM | POA: Diagnosis not present

## 2019-02-20 DIAGNOSIS — D631 Anemia in chronic kidney disease: Secondary | ICD-10-CM | POA: Diagnosis not present

## 2019-02-20 DIAGNOSIS — D509 Iron deficiency anemia, unspecified: Secondary | ICD-10-CM | POA: Diagnosis not present

## 2019-02-20 DIAGNOSIS — N2581 Secondary hyperparathyroidism of renal origin: Secondary | ICD-10-CM | POA: Diagnosis not present

## 2019-02-20 DIAGNOSIS — N186 End stage renal disease: Secondary | ICD-10-CM | POA: Diagnosis not present

## 2019-02-23 DIAGNOSIS — Z992 Dependence on renal dialysis: Secondary | ICD-10-CM | POA: Diagnosis not present

## 2019-02-23 DIAGNOSIS — N186 End stage renal disease: Secondary | ICD-10-CM | POA: Diagnosis not present

## 2019-02-23 DIAGNOSIS — D631 Anemia in chronic kidney disease: Secondary | ICD-10-CM | POA: Diagnosis not present

## 2019-02-23 DIAGNOSIS — D509 Iron deficiency anemia, unspecified: Secondary | ICD-10-CM | POA: Diagnosis not present

## 2019-02-23 DIAGNOSIS — N2581 Secondary hyperparathyroidism of renal origin: Secondary | ICD-10-CM | POA: Diagnosis not present

## 2019-02-25 DIAGNOSIS — N186 End stage renal disease: Secondary | ICD-10-CM | POA: Diagnosis not present

## 2019-02-25 DIAGNOSIS — D631 Anemia in chronic kidney disease: Secondary | ICD-10-CM | POA: Diagnosis not present

## 2019-02-25 DIAGNOSIS — D509 Iron deficiency anemia, unspecified: Secondary | ICD-10-CM | POA: Diagnosis not present

## 2019-02-25 DIAGNOSIS — N2581 Secondary hyperparathyroidism of renal origin: Secondary | ICD-10-CM | POA: Diagnosis not present

## 2019-02-25 DIAGNOSIS — Z992 Dependence on renal dialysis: Secondary | ICD-10-CM | POA: Diagnosis not present

## 2019-02-27 DIAGNOSIS — N186 End stage renal disease: Secondary | ICD-10-CM | POA: Diagnosis not present

## 2019-02-27 DIAGNOSIS — N2581 Secondary hyperparathyroidism of renal origin: Secondary | ICD-10-CM | POA: Diagnosis not present

## 2019-02-27 DIAGNOSIS — D631 Anemia in chronic kidney disease: Secondary | ICD-10-CM | POA: Diagnosis not present

## 2019-02-27 DIAGNOSIS — D509 Iron deficiency anemia, unspecified: Secondary | ICD-10-CM | POA: Diagnosis not present

## 2019-02-27 DIAGNOSIS — Z992 Dependence on renal dialysis: Secondary | ICD-10-CM | POA: Diagnosis not present

## 2019-03-02 DIAGNOSIS — N186 End stage renal disease: Secondary | ICD-10-CM | POA: Diagnosis not present

## 2019-03-02 DIAGNOSIS — N2581 Secondary hyperparathyroidism of renal origin: Secondary | ICD-10-CM | POA: Diagnosis not present

## 2019-03-02 DIAGNOSIS — D631 Anemia in chronic kidney disease: Secondary | ICD-10-CM | POA: Diagnosis not present

## 2019-03-02 DIAGNOSIS — Z992 Dependence on renal dialysis: Secondary | ICD-10-CM | POA: Diagnosis not present

## 2019-03-02 DIAGNOSIS — D509 Iron deficiency anemia, unspecified: Secondary | ICD-10-CM | POA: Diagnosis not present

## 2019-03-04 DIAGNOSIS — Z992 Dependence on renal dialysis: Secondary | ICD-10-CM | POA: Diagnosis not present

## 2019-03-04 DIAGNOSIS — D631 Anemia in chronic kidney disease: Secondary | ICD-10-CM | POA: Diagnosis not present

## 2019-03-04 DIAGNOSIS — D509 Iron deficiency anemia, unspecified: Secondary | ICD-10-CM | POA: Diagnosis not present

## 2019-03-04 DIAGNOSIS — N2581 Secondary hyperparathyroidism of renal origin: Secondary | ICD-10-CM | POA: Diagnosis not present

## 2019-03-04 DIAGNOSIS — N186 End stage renal disease: Secondary | ICD-10-CM | POA: Diagnosis not present

## 2019-03-06 DIAGNOSIS — D509 Iron deficiency anemia, unspecified: Secondary | ICD-10-CM | POA: Diagnosis not present

## 2019-03-06 DIAGNOSIS — N186 End stage renal disease: Secondary | ICD-10-CM | POA: Diagnosis not present

## 2019-03-06 DIAGNOSIS — N2581 Secondary hyperparathyroidism of renal origin: Secondary | ICD-10-CM | POA: Diagnosis not present

## 2019-03-06 DIAGNOSIS — Z992 Dependence on renal dialysis: Secondary | ICD-10-CM | POA: Diagnosis not present

## 2019-03-06 DIAGNOSIS — D631 Anemia in chronic kidney disease: Secondary | ICD-10-CM | POA: Diagnosis not present

## 2019-03-09 DIAGNOSIS — N2581 Secondary hyperparathyroidism of renal origin: Secondary | ICD-10-CM | POA: Diagnosis not present

## 2019-03-09 DIAGNOSIS — Z992 Dependence on renal dialysis: Secondary | ICD-10-CM | POA: Diagnosis not present

## 2019-03-09 DIAGNOSIS — D509 Iron deficiency anemia, unspecified: Secondary | ICD-10-CM | POA: Diagnosis not present

## 2019-03-09 DIAGNOSIS — D631 Anemia in chronic kidney disease: Secondary | ICD-10-CM | POA: Diagnosis not present

## 2019-03-09 DIAGNOSIS — N186 End stage renal disease: Secondary | ICD-10-CM | POA: Diagnosis not present

## 2019-03-11 DIAGNOSIS — D631 Anemia in chronic kidney disease: Secondary | ICD-10-CM | POA: Diagnosis not present

## 2019-03-11 DIAGNOSIS — N2581 Secondary hyperparathyroidism of renal origin: Secondary | ICD-10-CM | POA: Diagnosis not present

## 2019-03-11 DIAGNOSIS — D509 Iron deficiency anemia, unspecified: Secondary | ICD-10-CM | POA: Diagnosis not present

## 2019-03-11 DIAGNOSIS — Z992 Dependence on renal dialysis: Secondary | ICD-10-CM | POA: Diagnosis not present

## 2019-03-11 DIAGNOSIS — N186 End stage renal disease: Secondary | ICD-10-CM | POA: Diagnosis not present

## 2019-03-13 DIAGNOSIS — D631 Anemia in chronic kidney disease: Secondary | ICD-10-CM | POA: Diagnosis not present

## 2019-03-13 DIAGNOSIS — N186 End stage renal disease: Secondary | ICD-10-CM | POA: Diagnosis not present

## 2019-03-13 DIAGNOSIS — D509 Iron deficiency anemia, unspecified: Secondary | ICD-10-CM | POA: Diagnosis not present

## 2019-03-13 DIAGNOSIS — N2581 Secondary hyperparathyroidism of renal origin: Secondary | ICD-10-CM | POA: Diagnosis not present

## 2019-03-13 DIAGNOSIS — Z992 Dependence on renal dialysis: Secondary | ICD-10-CM | POA: Diagnosis not present

## 2019-03-16 DIAGNOSIS — Z992 Dependence on renal dialysis: Secondary | ICD-10-CM | POA: Diagnosis not present

## 2019-03-16 DIAGNOSIS — N186 End stage renal disease: Secondary | ICD-10-CM | POA: Diagnosis not present

## 2019-03-16 DIAGNOSIS — D631 Anemia in chronic kidney disease: Secondary | ICD-10-CM | POA: Diagnosis not present

## 2019-03-16 DIAGNOSIS — D509 Iron deficiency anemia, unspecified: Secondary | ICD-10-CM | POA: Diagnosis not present

## 2019-03-16 DIAGNOSIS — N2581 Secondary hyperparathyroidism of renal origin: Secondary | ICD-10-CM | POA: Diagnosis not present

## 2019-03-17 DIAGNOSIS — Z992 Dependence on renal dialysis: Secondary | ICD-10-CM | POA: Diagnosis not present

## 2019-03-17 DIAGNOSIS — N186 End stage renal disease: Secondary | ICD-10-CM | POA: Diagnosis not present

## 2019-03-18 DIAGNOSIS — D631 Anemia in chronic kidney disease: Secondary | ICD-10-CM | POA: Diagnosis not present

## 2019-03-18 DIAGNOSIS — Z992 Dependence on renal dialysis: Secondary | ICD-10-CM | POA: Diagnosis not present

## 2019-03-18 DIAGNOSIS — N2581 Secondary hyperparathyroidism of renal origin: Secondary | ICD-10-CM | POA: Diagnosis not present

## 2019-03-18 DIAGNOSIS — N186 End stage renal disease: Secondary | ICD-10-CM | POA: Diagnosis not present

## 2019-03-18 DIAGNOSIS — D509 Iron deficiency anemia, unspecified: Secondary | ICD-10-CM | POA: Diagnosis not present

## 2019-03-20 DIAGNOSIS — D509 Iron deficiency anemia, unspecified: Secondary | ICD-10-CM | POA: Diagnosis not present

## 2019-03-20 DIAGNOSIS — N186 End stage renal disease: Secondary | ICD-10-CM | POA: Diagnosis not present

## 2019-03-20 DIAGNOSIS — Z992 Dependence on renal dialysis: Secondary | ICD-10-CM | POA: Diagnosis not present

## 2019-03-20 DIAGNOSIS — N2581 Secondary hyperparathyroidism of renal origin: Secondary | ICD-10-CM | POA: Diagnosis not present

## 2019-03-20 DIAGNOSIS — D631 Anemia in chronic kidney disease: Secondary | ICD-10-CM | POA: Diagnosis not present

## 2019-03-23 DIAGNOSIS — D631 Anemia in chronic kidney disease: Secondary | ICD-10-CM | POA: Diagnosis not present

## 2019-03-23 DIAGNOSIS — D509 Iron deficiency anemia, unspecified: Secondary | ICD-10-CM | POA: Diagnosis not present

## 2019-03-23 DIAGNOSIS — N186 End stage renal disease: Secondary | ICD-10-CM | POA: Diagnosis not present

## 2019-03-23 DIAGNOSIS — Z992 Dependence on renal dialysis: Secondary | ICD-10-CM | POA: Diagnosis not present

## 2019-03-23 DIAGNOSIS — N2581 Secondary hyperparathyroidism of renal origin: Secondary | ICD-10-CM | POA: Diagnosis not present

## 2019-03-25 DIAGNOSIS — D631 Anemia in chronic kidney disease: Secondary | ICD-10-CM | POA: Diagnosis not present

## 2019-03-25 DIAGNOSIS — N2581 Secondary hyperparathyroidism of renal origin: Secondary | ICD-10-CM | POA: Diagnosis not present

## 2019-03-25 DIAGNOSIS — D509 Iron deficiency anemia, unspecified: Secondary | ICD-10-CM | POA: Diagnosis not present

## 2019-03-25 DIAGNOSIS — Z992 Dependence on renal dialysis: Secondary | ICD-10-CM | POA: Diagnosis not present

## 2019-03-25 DIAGNOSIS — N186 End stage renal disease: Secondary | ICD-10-CM | POA: Diagnosis not present

## 2019-03-27 DIAGNOSIS — D631 Anemia in chronic kidney disease: Secondary | ICD-10-CM | POA: Diagnosis not present

## 2019-03-27 DIAGNOSIS — D509 Iron deficiency anemia, unspecified: Secondary | ICD-10-CM | POA: Diagnosis not present

## 2019-03-27 DIAGNOSIS — N2581 Secondary hyperparathyroidism of renal origin: Secondary | ICD-10-CM | POA: Diagnosis not present

## 2019-03-27 DIAGNOSIS — Z992 Dependence on renal dialysis: Secondary | ICD-10-CM | POA: Diagnosis not present

## 2019-03-27 DIAGNOSIS — N186 End stage renal disease: Secondary | ICD-10-CM | POA: Diagnosis not present

## 2019-03-30 DIAGNOSIS — N186 End stage renal disease: Secondary | ICD-10-CM | POA: Diagnosis not present

## 2019-03-30 DIAGNOSIS — D631 Anemia in chronic kidney disease: Secondary | ICD-10-CM | POA: Diagnosis not present

## 2019-03-30 DIAGNOSIS — N2581 Secondary hyperparathyroidism of renal origin: Secondary | ICD-10-CM | POA: Diagnosis not present

## 2019-03-30 DIAGNOSIS — Z992 Dependence on renal dialysis: Secondary | ICD-10-CM | POA: Diagnosis not present

## 2019-03-30 DIAGNOSIS — D509 Iron deficiency anemia, unspecified: Secondary | ICD-10-CM | POA: Diagnosis not present

## 2019-04-01 DIAGNOSIS — N186 End stage renal disease: Secondary | ICD-10-CM | POA: Diagnosis not present

## 2019-04-01 DIAGNOSIS — D509 Iron deficiency anemia, unspecified: Secondary | ICD-10-CM | POA: Diagnosis not present

## 2019-04-01 DIAGNOSIS — N2581 Secondary hyperparathyroidism of renal origin: Secondary | ICD-10-CM | POA: Diagnosis not present

## 2019-04-01 DIAGNOSIS — D631 Anemia in chronic kidney disease: Secondary | ICD-10-CM | POA: Diagnosis not present

## 2019-04-01 DIAGNOSIS — Z992 Dependence on renal dialysis: Secondary | ICD-10-CM | POA: Diagnosis not present

## 2019-04-03 DIAGNOSIS — D631 Anemia in chronic kidney disease: Secondary | ICD-10-CM | POA: Diagnosis not present

## 2019-04-03 DIAGNOSIS — N2581 Secondary hyperparathyroidism of renal origin: Secondary | ICD-10-CM | POA: Diagnosis not present

## 2019-04-03 DIAGNOSIS — D509 Iron deficiency anemia, unspecified: Secondary | ICD-10-CM | POA: Diagnosis not present

## 2019-04-03 DIAGNOSIS — Z992 Dependence on renal dialysis: Secondary | ICD-10-CM | POA: Diagnosis not present

## 2019-04-03 DIAGNOSIS — N186 End stage renal disease: Secondary | ICD-10-CM | POA: Diagnosis not present

## 2019-04-06 DIAGNOSIS — Z992 Dependence on renal dialysis: Secondary | ICD-10-CM | POA: Diagnosis not present

## 2019-04-06 DIAGNOSIS — N186 End stage renal disease: Secondary | ICD-10-CM | POA: Diagnosis not present

## 2019-04-06 DIAGNOSIS — D509 Iron deficiency anemia, unspecified: Secondary | ICD-10-CM | POA: Diagnosis not present

## 2019-04-06 DIAGNOSIS — D631 Anemia in chronic kidney disease: Secondary | ICD-10-CM | POA: Diagnosis not present

## 2019-04-06 DIAGNOSIS — N2581 Secondary hyperparathyroidism of renal origin: Secondary | ICD-10-CM | POA: Diagnosis not present

## 2019-04-08 DIAGNOSIS — N186 End stage renal disease: Secondary | ICD-10-CM | POA: Diagnosis not present

## 2019-04-08 DIAGNOSIS — D631 Anemia in chronic kidney disease: Secondary | ICD-10-CM | POA: Diagnosis not present

## 2019-04-08 DIAGNOSIS — N2581 Secondary hyperparathyroidism of renal origin: Secondary | ICD-10-CM | POA: Diagnosis not present

## 2019-04-08 DIAGNOSIS — D509 Iron deficiency anemia, unspecified: Secondary | ICD-10-CM | POA: Diagnosis not present

## 2019-04-08 DIAGNOSIS — Z992 Dependence on renal dialysis: Secondary | ICD-10-CM | POA: Diagnosis not present

## 2019-04-10 DIAGNOSIS — N2581 Secondary hyperparathyroidism of renal origin: Secondary | ICD-10-CM | POA: Diagnosis not present

## 2019-04-10 DIAGNOSIS — D509 Iron deficiency anemia, unspecified: Secondary | ICD-10-CM | POA: Diagnosis not present

## 2019-04-10 DIAGNOSIS — Z992 Dependence on renal dialysis: Secondary | ICD-10-CM | POA: Diagnosis not present

## 2019-04-10 DIAGNOSIS — D631 Anemia in chronic kidney disease: Secondary | ICD-10-CM | POA: Diagnosis not present

## 2019-04-10 DIAGNOSIS — N186 End stage renal disease: Secondary | ICD-10-CM | POA: Diagnosis not present

## 2019-04-13 DIAGNOSIS — N186 End stage renal disease: Secondary | ICD-10-CM | POA: Diagnosis not present

## 2019-04-13 DIAGNOSIS — Z992 Dependence on renal dialysis: Secondary | ICD-10-CM | POA: Diagnosis not present

## 2019-04-13 DIAGNOSIS — D631 Anemia in chronic kidney disease: Secondary | ICD-10-CM | POA: Diagnosis not present

## 2019-04-13 DIAGNOSIS — D509 Iron deficiency anemia, unspecified: Secondary | ICD-10-CM | POA: Diagnosis not present

## 2019-04-13 DIAGNOSIS — N2581 Secondary hyperparathyroidism of renal origin: Secondary | ICD-10-CM | POA: Diagnosis not present

## 2019-04-15 DIAGNOSIS — N186 End stage renal disease: Secondary | ICD-10-CM | POA: Diagnosis not present

## 2019-04-15 DIAGNOSIS — D509 Iron deficiency anemia, unspecified: Secondary | ICD-10-CM | POA: Diagnosis not present

## 2019-04-15 DIAGNOSIS — N2581 Secondary hyperparathyroidism of renal origin: Secondary | ICD-10-CM | POA: Diagnosis not present

## 2019-04-15 DIAGNOSIS — Z992 Dependence on renal dialysis: Secondary | ICD-10-CM | POA: Diagnosis not present

## 2019-04-15 DIAGNOSIS — D631 Anemia in chronic kidney disease: Secondary | ICD-10-CM | POA: Diagnosis not present

## 2019-04-16 DIAGNOSIS — N186 End stage renal disease: Secondary | ICD-10-CM | POA: Diagnosis not present

## 2019-04-16 DIAGNOSIS — Z992 Dependence on renal dialysis: Secondary | ICD-10-CM | POA: Diagnosis not present

## 2019-04-17 DIAGNOSIS — D509 Iron deficiency anemia, unspecified: Secondary | ICD-10-CM | POA: Diagnosis not present

## 2019-04-17 DIAGNOSIS — D631 Anemia in chronic kidney disease: Secondary | ICD-10-CM | POA: Diagnosis not present

## 2019-04-17 DIAGNOSIS — Z992 Dependence on renal dialysis: Secondary | ICD-10-CM | POA: Diagnosis not present

## 2019-04-17 DIAGNOSIS — N2581 Secondary hyperparathyroidism of renal origin: Secondary | ICD-10-CM | POA: Diagnosis not present

## 2019-04-17 DIAGNOSIS — N186 End stage renal disease: Secondary | ICD-10-CM | POA: Diagnosis not present

## 2019-04-20 DIAGNOSIS — D509 Iron deficiency anemia, unspecified: Secondary | ICD-10-CM | POA: Diagnosis not present

## 2019-04-20 DIAGNOSIS — Z992 Dependence on renal dialysis: Secondary | ICD-10-CM | POA: Diagnosis not present

## 2019-04-20 DIAGNOSIS — D631 Anemia in chronic kidney disease: Secondary | ICD-10-CM | POA: Diagnosis not present

## 2019-04-20 DIAGNOSIS — N2581 Secondary hyperparathyroidism of renal origin: Secondary | ICD-10-CM | POA: Diagnosis not present

## 2019-04-20 DIAGNOSIS — N186 End stage renal disease: Secondary | ICD-10-CM | POA: Diagnosis not present

## 2019-04-22 DIAGNOSIS — D631 Anemia in chronic kidney disease: Secondary | ICD-10-CM | POA: Diagnosis not present

## 2019-04-22 DIAGNOSIS — N2581 Secondary hyperparathyroidism of renal origin: Secondary | ICD-10-CM | POA: Diagnosis not present

## 2019-04-22 DIAGNOSIS — Z992 Dependence on renal dialysis: Secondary | ICD-10-CM | POA: Diagnosis not present

## 2019-04-22 DIAGNOSIS — D509 Iron deficiency anemia, unspecified: Secondary | ICD-10-CM | POA: Diagnosis not present

## 2019-04-22 DIAGNOSIS — N186 End stage renal disease: Secondary | ICD-10-CM | POA: Diagnosis not present

## 2019-04-24 DIAGNOSIS — N186 End stage renal disease: Secondary | ICD-10-CM | POA: Diagnosis not present

## 2019-04-24 DIAGNOSIS — Z992 Dependence on renal dialysis: Secondary | ICD-10-CM | POA: Diagnosis not present

## 2019-04-24 DIAGNOSIS — N2581 Secondary hyperparathyroidism of renal origin: Secondary | ICD-10-CM | POA: Diagnosis not present

## 2019-04-24 DIAGNOSIS — D509 Iron deficiency anemia, unspecified: Secondary | ICD-10-CM | POA: Diagnosis not present

## 2019-04-24 DIAGNOSIS — D631 Anemia in chronic kidney disease: Secondary | ICD-10-CM | POA: Diagnosis not present

## 2019-04-27 DIAGNOSIS — D631 Anemia in chronic kidney disease: Secondary | ICD-10-CM | POA: Diagnosis not present

## 2019-04-27 DIAGNOSIS — Z992 Dependence on renal dialysis: Secondary | ICD-10-CM | POA: Diagnosis not present

## 2019-04-27 DIAGNOSIS — D509 Iron deficiency anemia, unspecified: Secondary | ICD-10-CM | POA: Diagnosis not present

## 2019-04-27 DIAGNOSIS — N186 End stage renal disease: Secondary | ICD-10-CM | POA: Diagnosis not present

## 2019-04-27 DIAGNOSIS — N2581 Secondary hyperparathyroidism of renal origin: Secondary | ICD-10-CM | POA: Diagnosis not present

## 2019-04-29 DIAGNOSIS — E113513 Type 2 diabetes mellitus with proliferative diabetic retinopathy with macular edema, bilateral: Secondary | ICD-10-CM | POA: Diagnosis not present

## 2019-04-29 DIAGNOSIS — Z992 Dependence on renal dialysis: Secondary | ICD-10-CM | POA: Diagnosis not present

## 2019-04-29 DIAGNOSIS — D509 Iron deficiency anemia, unspecified: Secondary | ICD-10-CM | POA: Diagnosis not present

## 2019-04-29 DIAGNOSIS — E11311 Type 2 diabetes mellitus with unspecified diabetic retinopathy with macular edema: Secondary | ICD-10-CM | POA: Diagnosis not present

## 2019-04-29 DIAGNOSIS — H43823 Vitreomacular adhesion, bilateral: Secondary | ICD-10-CM | POA: Diagnosis not present

## 2019-04-29 DIAGNOSIS — N186 End stage renal disease: Secondary | ICD-10-CM | POA: Diagnosis not present

## 2019-04-29 DIAGNOSIS — D631 Anemia in chronic kidney disease: Secondary | ICD-10-CM | POA: Diagnosis not present

## 2019-04-29 DIAGNOSIS — N2581 Secondary hyperparathyroidism of renal origin: Secondary | ICD-10-CM | POA: Diagnosis not present

## 2019-04-29 DIAGNOSIS — H25813 Combined forms of age-related cataract, bilateral: Secondary | ICD-10-CM | POA: Diagnosis not present

## 2019-05-01 DIAGNOSIS — Z992 Dependence on renal dialysis: Secondary | ICD-10-CM | POA: Diagnosis not present

## 2019-05-01 DIAGNOSIS — N2581 Secondary hyperparathyroidism of renal origin: Secondary | ICD-10-CM | POA: Diagnosis not present

## 2019-05-01 DIAGNOSIS — N186 End stage renal disease: Secondary | ICD-10-CM | POA: Diagnosis not present

## 2019-05-01 DIAGNOSIS — D631 Anemia in chronic kidney disease: Secondary | ICD-10-CM | POA: Diagnosis not present

## 2019-05-01 DIAGNOSIS — D509 Iron deficiency anemia, unspecified: Secondary | ICD-10-CM | POA: Diagnosis not present

## 2019-05-04 DIAGNOSIS — D631 Anemia in chronic kidney disease: Secondary | ICD-10-CM | POA: Diagnosis not present

## 2019-05-04 DIAGNOSIS — N2581 Secondary hyperparathyroidism of renal origin: Secondary | ICD-10-CM | POA: Diagnosis not present

## 2019-05-04 DIAGNOSIS — Z992 Dependence on renal dialysis: Secondary | ICD-10-CM | POA: Diagnosis not present

## 2019-05-04 DIAGNOSIS — D509 Iron deficiency anemia, unspecified: Secondary | ICD-10-CM | POA: Diagnosis not present

## 2019-05-04 DIAGNOSIS — N186 End stage renal disease: Secondary | ICD-10-CM | POA: Diagnosis not present

## 2019-05-06 DIAGNOSIS — Z992 Dependence on renal dialysis: Secondary | ICD-10-CM | POA: Diagnosis not present

## 2019-05-06 DIAGNOSIS — N2581 Secondary hyperparathyroidism of renal origin: Secondary | ICD-10-CM | POA: Diagnosis not present

## 2019-05-06 DIAGNOSIS — N186 End stage renal disease: Secondary | ICD-10-CM | POA: Diagnosis not present

## 2019-05-06 DIAGNOSIS — D631 Anemia in chronic kidney disease: Secondary | ICD-10-CM | POA: Diagnosis not present

## 2019-05-06 DIAGNOSIS — E1136 Type 2 diabetes mellitus with diabetic cataract: Secondary | ICD-10-CM | POA: Diagnosis not present

## 2019-05-06 DIAGNOSIS — D509 Iron deficiency anemia, unspecified: Secondary | ICD-10-CM | POA: Diagnosis not present

## 2019-05-06 DIAGNOSIS — E11311 Type 2 diabetes mellitus with unspecified diabetic retinopathy with macular edema: Secondary | ICD-10-CM | POA: Diagnosis not present

## 2019-05-06 DIAGNOSIS — E113513 Type 2 diabetes mellitus with proliferative diabetic retinopathy with macular edema, bilateral: Secondary | ICD-10-CM | POA: Diagnosis not present

## 2019-05-08 DIAGNOSIS — N2581 Secondary hyperparathyroidism of renal origin: Secondary | ICD-10-CM | POA: Diagnosis not present

## 2019-05-08 DIAGNOSIS — N186 End stage renal disease: Secondary | ICD-10-CM | POA: Diagnosis not present

## 2019-05-08 DIAGNOSIS — Z992 Dependence on renal dialysis: Secondary | ICD-10-CM | POA: Diagnosis not present

## 2019-05-08 DIAGNOSIS — D631 Anemia in chronic kidney disease: Secondary | ICD-10-CM | POA: Diagnosis not present

## 2019-05-08 DIAGNOSIS — D509 Iron deficiency anemia, unspecified: Secondary | ICD-10-CM | POA: Diagnosis not present

## 2019-05-11 DIAGNOSIS — D509 Iron deficiency anemia, unspecified: Secondary | ICD-10-CM | POA: Diagnosis not present

## 2019-05-11 DIAGNOSIS — D631 Anemia in chronic kidney disease: Secondary | ICD-10-CM | POA: Diagnosis not present

## 2019-05-11 DIAGNOSIS — N2581 Secondary hyperparathyroidism of renal origin: Secondary | ICD-10-CM | POA: Diagnosis not present

## 2019-05-11 DIAGNOSIS — N186 End stage renal disease: Secondary | ICD-10-CM | POA: Diagnosis not present

## 2019-05-11 DIAGNOSIS — Z992 Dependence on renal dialysis: Secondary | ICD-10-CM | POA: Diagnosis not present

## 2019-05-13 DIAGNOSIS — N2581 Secondary hyperparathyroidism of renal origin: Secondary | ICD-10-CM | POA: Diagnosis not present

## 2019-05-13 DIAGNOSIS — N186 End stage renal disease: Secondary | ICD-10-CM | POA: Diagnosis not present

## 2019-05-13 DIAGNOSIS — D631 Anemia in chronic kidney disease: Secondary | ICD-10-CM | POA: Diagnosis not present

## 2019-05-13 DIAGNOSIS — D509 Iron deficiency anemia, unspecified: Secondary | ICD-10-CM | POA: Diagnosis not present

## 2019-05-13 DIAGNOSIS — Z992 Dependence on renal dialysis: Secondary | ICD-10-CM | POA: Diagnosis not present

## 2019-05-14 ENCOUNTER — Other Ambulatory Visit (HOSPITAL_COMMUNITY): Payer: Self-pay | Admitting: Family Medicine

## 2019-05-14 ENCOUNTER — Ambulatory Visit (HOSPITAL_COMMUNITY)
Admission: RE | Admit: 2019-05-14 | Discharge: 2019-05-14 | Disposition: A | Discharge status: Home / Self Care | Payer: Medicare Other | Source: Ambulatory Visit | Attending: Family Medicine | Admitting: Family Medicine

## 2019-05-14 ENCOUNTER — Other Ambulatory Visit: Payer: Self-pay

## 2019-05-14 DIAGNOSIS — Z1389 Encounter for screening for other disorder: Secondary | ICD-10-CM | POA: Diagnosis not present

## 2019-05-14 DIAGNOSIS — R0600 Dyspnea, unspecified: Secondary | ICD-10-CM | POA: Insufficient documentation

## 2019-05-14 DIAGNOSIS — E1129 Type 2 diabetes mellitus with other diabetic kidney complication: Secondary | ICD-10-CM | POA: Diagnosis not present

## 2019-05-14 DIAGNOSIS — Z0001 Encounter for general adult medical examination with abnormal findings: Secondary | ICD-10-CM | POA: Diagnosis not present

## 2019-05-14 DIAGNOSIS — Z6841 Body Mass Index (BMI) 40.0 and over, adult: Secondary | ICD-10-CM | POA: Diagnosis not present

## 2019-05-14 DIAGNOSIS — I10 Essential (primary) hypertension: Secondary | ICD-10-CM | POA: Diagnosis not present

## 2019-05-14 DIAGNOSIS — R0602 Shortness of breath: Secondary | ICD-10-CM | POA: Diagnosis not present

## 2019-05-14 DIAGNOSIS — E782 Mixed hyperlipidemia: Secondary | ICD-10-CM | POA: Diagnosis not present

## 2019-05-15 DIAGNOSIS — D509 Iron deficiency anemia, unspecified: Secondary | ICD-10-CM | POA: Diagnosis not present

## 2019-05-15 DIAGNOSIS — N2581 Secondary hyperparathyroidism of renal origin: Secondary | ICD-10-CM | POA: Diagnosis not present

## 2019-05-15 DIAGNOSIS — D631 Anemia in chronic kidney disease: Secondary | ICD-10-CM | POA: Diagnosis not present

## 2019-05-15 DIAGNOSIS — Z992 Dependence on renal dialysis: Secondary | ICD-10-CM | POA: Diagnosis not present

## 2019-05-15 DIAGNOSIS — N186 End stage renal disease: Secondary | ICD-10-CM | POA: Diagnosis not present

## 2019-05-17 DIAGNOSIS — N186 End stage renal disease: Secondary | ICD-10-CM | POA: Diagnosis not present

## 2019-05-17 DIAGNOSIS — Z992 Dependence on renal dialysis: Secondary | ICD-10-CM | POA: Diagnosis not present

## 2019-05-18 DIAGNOSIS — D509 Iron deficiency anemia, unspecified: Secondary | ICD-10-CM | POA: Diagnosis not present

## 2019-05-18 DIAGNOSIS — N2581 Secondary hyperparathyroidism of renal origin: Secondary | ICD-10-CM | POA: Diagnosis not present

## 2019-05-18 DIAGNOSIS — N186 End stage renal disease: Secondary | ICD-10-CM | POA: Diagnosis not present

## 2019-05-18 DIAGNOSIS — Z992 Dependence on renal dialysis: Secondary | ICD-10-CM | POA: Diagnosis not present

## 2019-05-18 DIAGNOSIS — D631 Anemia in chronic kidney disease: Secondary | ICD-10-CM | POA: Diagnosis not present

## 2019-05-19 DIAGNOSIS — T82898A Other specified complication of vascular prosthetic devices, implants and grafts, initial encounter: Secondary | ICD-10-CM | POA: Diagnosis not present

## 2019-05-19 DIAGNOSIS — T82590A Other mechanical complication of surgically created arteriovenous fistula, initial encounter: Secondary | ICD-10-CM | POA: Diagnosis not present

## 2019-05-19 DIAGNOSIS — N19 Unspecified kidney failure: Secondary | ICD-10-CM | POA: Diagnosis not present

## 2019-05-19 DIAGNOSIS — N186 End stage renal disease: Secondary | ICD-10-CM | POA: Diagnosis not present

## 2019-05-19 DIAGNOSIS — T82858A Stenosis of vascular prosthetic devices, implants and grafts, initial encounter: Secondary | ICD-10-CM | POA: Diagnosis not present

## 2019-05-20 DIAGNOSIS — D509 Iron deficiency anemia, unspecified: Secondary | ICD-10-CM | POA: Diagnosis not present

## 2019-05-20 DIAGNOSIS — D631 Anemia in chronic kidney disease: Secondary | ICD-10-CM | POA: Diagnosis not present

## 2019-05-20 DIAGNOSIS — E113513 Type 2 diabetes mellitus with proliferative diabetic retinopathy with macular edema, bilateral: Secondary | ICD-10-CM | POA: Diagnosis not present

## 2019-05-20 DIAGNOSIS — E1136 Type 2 diabetes mellitus with diabetic cataract: Secondary | ICD-10-CM | POA: Diagnosis not present

## 2019-05-20 DIAGNOSIS — N2581 Secondary hyperparathyroidism of renal origin: Secondary | ICD-10-CM | POA: Diagnosis not present

## 2019-05-20 DIAGNOSIS — Z7984 Long term (current) use of oral hypoglycemic drugs: Secondary | ICD-10-CM | POA: Diagnosis not present

## 2019-05-20 DIAGNOSIS — N186 End stage renal disease: Secondary | ICD-10-CM | POA: Diagnosis not present

## 2019-05-20 DIAGNOSIS — Z992 Dependence on renal dialysis: Secondary | ICD-10-CM | POA: Diagnosis not present

## 2019-05-22 DIAGNOSIS — D631 Anemia in chronic kidney disease: Secondary | ICD-10-CM | POA: Diagnosis not present

## 2019-05-22 DIAGNOSIS — Z992 Dependence on renal dialysis: Secondary | ICD-10-CM | POA: Diagnosis not present

## 2019-05-22 DIAGNOSIS — N186 End stage renal disease: Secondary | ICD-10-CM | POA: Diagnosis not present

## 2019-05-22 DIAGNOSIS — D509 Iron deficiency anemia, unspecified: Secondary | ICD-10-CM | POA: Diagnosis not present

## 2019-05-22 DIAGNOSIS — N2581 Secondary hyperparathyroidism of renal origin: Secondary | ICD-10-CM | POA: Diagnosis not present

## 2019-05-25 DIAGNOSIS — N2581 Secondary hyperparathyroidism of renal origin: Secondary | ICD-10-CM | POA: Diagnosis not present

## 2019-05-25 DIAGNOSIS — D631 Anemia in chronic kidney disease: Secondary | ICD-10-CM | POA: Diagnosis not present

## 2019-05-25 DIAGNOSIS — N186 End stage renal disease: Secondary | ICD-10-CM | POA: Diagnosis not present

## 2019-05-25 DIAGNOSIS — Z992 Dependence on renal dialysis: Secondary | ICD-10-CM | POA: Diagnosis not present

## 2019-05-25 DIAGNOSIS — D509 Iron deficiency anemia, unspecified: Secondary | ICD-10-CM | POA: Diagnosis not present

## 2019-05-27 DIAGNOSIS — D631 Anemia in chronic kidney disease: Secondary | ICD-10-CM | POA: Diagnosis not present

## 2019-05-27 DIAGNOSIS — N2581 Secondary hyperparathyroidism of renal origin: Secondary | ICD-10-CM | POA: Diagnosis not present

## 2019-05-27 DIAGNOSIS — D509 Iron deficiency anemia, unspecified: Secondary | ICD-10-CM | POA: Diagnosis not present

## 2019-05-27 DIAGNOSIS — N186 End stage renal disease: Secondary | ICD-10-CM | POA: Diagnosis not present

## 2019-05-27 DIAGNOSIS — Z992 Dependence on renal dialysis: Secondary | ICD-10-CM | POA: Diagnosis not present

## 2019-05-29 DIAGNOSIS — Z992 Dependence on renal dialysis: Secondary | ICD-10-CM | POA: Diagnosis not present

## 2019-05-29 DIAGNOSIS — N186 End stage renal disease: Secondary | ICD-10-CM | POA: Diagnosis not present

## 2019-05-29 DIAGNOSIS — N2581 Secondary hyperparathyroidism of renal origin: Secondary | ICD-10-CM | POA: Diagnosis not present

## 2019-05-29 DIAGNOSIS — D631 Anemia in chronic kidney disease: Secondary | ICD-10-CM | POA: Diagnosis not present

## 2019-05-29 DIAGNOSIS — D509 Iron deficiency anemia, unspecified: Secondary | ICD-10-CM | POA: Diagnosis not present

## 2019-06-01 DIAGNOSIS — N186 End stage renal disease: Secondary | ICD-10-CM | POA: Diagnosis not present

## 2019-06-01 DIAGNOSIS — D509 Iron deficiency anemia, unspecified: Secondary | ICD-10-CM | POA: Diagnosis not present

## 2019-06-01 DIAGNOSIS — N2581 Secondary hyperparathyroidism of renal origin: Secondary | ICD-10-CM | POA: Diagnosis not present

## 2019-06-01 DIAGNOSIS — D631 Anemia in chronic kidney disease: Secondary | ICD-10-CM | POA: Diagnosis not present

## 2019-06-01 DIAGNOSIS — Z992 Dependence on renal dialysis: Secondary | ICD-10-CM | POA: Diagnosis not present

## 2019-06-03 DIAGNOSIS — D509 Iron deficiency anemia, unspecified: Secondary | ICD-10-CM | POA: Diagnosis not present

## 2019-06-03 DIAGNOSIS — Z992 Dependence on renal dialysis: Secondary | ICD-10-CM | POA: Diagnosis not present

## 2019-06-03 DIAGNOSIS — N2581 Secondary hyperparathyroidism of renal origin: Secondary | ICD-10-CM | POA: Diagnosis not present

## 2019-06-03 DIAGNOSIS — D631 Anemia in chronic kidney disease: Secondary | ICD-10-CM | POA: Diagnosis not present

## 2019-06-03 DIAGNOSIS — N186 End stage renal disease: Secondary | ICD-10-CM | POA: Diagnosis not present

## 2019-06-05 DIAGNOSIS — D509 Iron deficiency anemia, unspecified: Secondary | ICD-10-CM | POA: Diagnosis not present

## 2019-06-05 DIAGNOSIS — N186 End stage renal disease: Secondary | ICD-10-CM | POA: Diagnosis not present

## 2019-06-05 DIAGNOSIS — N2581 Secondary hyperparathyroidism of renal origin: Secondary | ICD-10-CM | POA: Diagnosis not present

## 2019-06-05 DIAGNOSIS — Z992 Dependence on renal dialysis: Secondary | ICD-10-CM | POA: Diagnosis not present

## 2019-06-05 DIAGNOSIS — D631 Anemia in chronic kidney disease: Secondary | ICD-10-CM | POA: Diagnosis not present

## 2019-06-08 DIAGNOSIS — D631 Anemia in chronic kidney disease: Secondary | ICD-10-CM | POA: Diagnosis not present

## 2019-06-08 DIAGNOSIS — Z992 Dependence on renal dialysis: Secondary | ICD-10-CM | POA: Diagnosis not present

## 2019-06-08 DIAGNOSIS — N2581 Secondary hyperparathyroidism of renal origin: Secondary | ICD-10-CM | POA: Diagnosis not present

## 2019-06-08 DIAGNOSIS — N186 End stage renal disease: Secondary | ICD-10-CM | POA: Diagnosis not present

## 2019-06-08 DIAGNOSIS — D509 Iron deficiency anemia, unspecified: Secondary | ICD-10-CM | POA: Diagnosis not present

## 2019-06-10 DIAGNOSIS — Z992 Dependence on renal dialysis: Secondary | ICD-10-CM | POA: Diagnosis not present

## 2019-06-10 DIAGNOSIS — N2581 Secondary hyperparathyroidism of renal origin: Secondary | ICD-10-CM | POA: Diagnosis not present

## 2019-06-10 DIAGNOSIS — D509 Iron deficiency anemia, unspecified: Secondary | ICD-10-CM | POA: Diagnosis not present

## 2019-06-10 DIAGNOSIS — D631 Anemia in chronic kidney disease: Secondary | ICD-10-CM | POA: Diagnosis not present

## 2019-06-10 DIAGNOSIS — N186 End stage renal disease: Secondary | ICD-10-CM | POA: Diagnosis not present

## 2019-06-11 ENCOUNTER — Ambulatory Visit (INDEPENDENT_AMBULATORY_CARE_PROVIDER_SITE_OTHER): Payer: Medicare Other | Admitting: Nurse Practitioner

## 2019-06-11 ENCOUNTER — Other Ambulatory Visit: Payer: Self-pay

## 2019-06-11 ENCOUNTER — Encounter: Payer: Self-pay | Admitting: Nurse Practitioner

## 2019-06-11 ENCOUNTER — Telehealth: Payer: Self-pay

## 2019-06-11 VITALS — BP 124/68 | HR 87 | Temp 97.5°F | Ht 63.0 in | Wt 240.6 lb

## 2019-06-11 DIAGNOSIS — R1084 Generalized abdominal pain: Secondary | ICD-10-CM

## 2019-06-11 DIAGNOSIS — R112 Nausea with vomiting, unspecified: Secondary | ICD-10-CM | POA: Diagnosis not present

## 2019-06-11 DIAGNOSIS — K5909 Other constipation: Secondary | ICD-10-CM | POA: Diagnosis not present

## 2019-06-11 NOTE — Telephone Encounter (Signed)
CT abd/pelvis w/contrast scheduled for 06/18/19. Called to inform pt. She has another appt 06/18/19. Gave her phone number to Central Scheduling to reschedule appt. She is aware to go to lab for bun/creat prior to CT.  According to appt desk, pt rescheduled CT to 07/02/19 at 8:00am. Appt letter mailed with lab order.

## 2019-06-11 NOTE — Progress Notes (Signed)
CC'ED TO PCP 
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
## 2019-06-11 NOTE — Assessment & Plan Note (Signed)
The patient previously was having generalized abdominal pain that was relieved with a bowel movement in the setting of known constipation.  Felt likely abdominal pain due to constipation, possibly IBS constipation type.  However, her abdominal pain has progressed and is now more frequent, not always relieved by bowel movement, persistent.  She has had a bowel movement last night.  She is currently having abdominal pain.  Pain does not appear to be severe, patient is in no acute distress.  At this point we will proceed with CT imaging she has not had any recently to ensure there is no concerning etiologies surrounding her abdominal pain.  Follow-up in 2 months.  Called and discussed CT with the CT imaging staff and they indicated on hemodialysis okay to proceed with CT imaging with contrast.

## 2019-06-11 NOTE — Patient Instructions (Signed)
Your health issues we discussed today were:   Constipation: 1. Continue taking Linzess 290 mcg once daily 2. Continue to use MiraLAX as needed.  You can even consider a half dose in 4 ounces of liquid to help with your fluid restrictions 3. Call us if any worsening or severe symptoms  Abdominal pain: 1. Constipation likely is contributing to your abdominal pain 2. However, because it is becoming more frequent and not always relieved by bowel movement we will check CT imaging of your abdomen 3. Radiology will call to schedule your appointment 4. Call us if your abdominal pain becomes worse or severe 5. Further recommendations will follow your CT results  Overall I recommend:  1. Continue your other current medications 2. Return for follow-up in 2 months 3. Call us if you have any questions or concerns   Because of recent events of COVID-19 ("Coronavirus"), follow CDC recommendations:  1. Wash your hand frequently 2. Avoid touching your face 3. Stay away from people who are sick 4. If you have symptoms such as fever, cough, shortness of breath then call your healthcare provider for further guidance 5. If you are sick, STAY AT HOME unless otherwise directed by your healthcare provider. 6. Follow directions from state and national officials regarding staying safe   At [facility] we value your feedback. You may receive a survey about your visit today. Please share your experience as we strive to create trusting relationships with our patients to provide genuine, compassionate, quality care.  We appreciate your understanding and patience as we review any laboratory studies, imaging, and other diagnostic tests that are ordered as we care for you. Our office policy is 5 business days for review of these results, and any emergent or urgent results are addressed in a timely manner for your best interest. If you do not hear from our office in 1 week, please contact us.   We  also encourage the use of MyChart, which contains your medical information for your review as well. If you are not enrolled in this feature, an access code is on this after visit summary for your convenience. Thank you for allowing [place] to be involved in your care.  It was great to see you today!  I hope you have a great summer!!

## 2019-06-11 NOTE — Assessment & Plan Note (Signed)
Constipation stable/somewhat improved.  Takes Linzess every day.  Occasionally needs MiraLAX.  Her abdominal pain is persistent and no longer regularly relieved with defecation.  No other red flag/warning signs or symptoms further evaluation of abdominal pain as per below.  At this time recommend she continue her Linzess 290 mcg daily, MiraLAX as needed.  Return for follow-up in 2 months.

## 2019-06-12 ENCOUNTER — Ambulatory Visit (INDEPENDENT_AMBULATORY_CARE_PROVIDER_SITE_OTHER): Payer: Medicare Other

## 2019-06-12 ENCOUNTER — Other Ambulatory Visit: Payer: Self-pay

## 2019-06-12 ENCOUNTER — Encounter: Payer: Self-pay | Admitting: Orthopaedic Surgery

## 2019-06-12 ENCOUNTER — Ambulatory Visit (INDEPENDENT_AMBULATORY_CARE_PROVIDER_SITE_OTHER): Payer: Medicare Other | Admitting: Orthopaedic Surgery

## 2019-06-12 VITALS — Ht 63.0 in | Wt 240.0 lb

## 2019-06-12 DIAGNOSIS — N2581 Secondary hyperparathyroidism of renal origin: Secondary | ICD-10-CM | POA: Diagnosis not present

## 2019-06-12 DIAGNOSIS — N186 End stage renal disease: Secondary | ICD-10-CM | POA: Diagnosis not present

## 2019-06-12 DIAGNOSIS — D631 Anemia in chronic kidney disease: Secondary | ICD-10-CM | POA: Diagnosis not present

## 2019-06-12 DIAGNOSIS — M25571 Pain in right ankle and joints of right foot: Secondary | ICD-10-CM

## 2019-06-12 DIAGNOSIS — D509 Iron deficiency anemia, unspecified: Secondary | ICD-10-CM | POA: Diagnosis not present

## 2019-06-12 DIAGNOSIS — Z992 Dependence on renal dialysis: Secondary | ICD-10-CM | POA: Diagnosis not present

## 2019-06-12 NOTE — Progress Notes (Signed)
Office Visit Note   Patient: Patricia [name]           Date of Birth: 1971/07/10           MRN: [id number] Visit Date: 06/12/2019              Requested by: Jake Samples, PA-C [address] [place],  [place] [address] PCP: Jake Samples, PA-C   Assessment & Plan: Visit Diagnoses:  1. Pain in right ankle and joints of right foot     Plan: Patient will call if she would like to proceed with fibular plate removal.  Her fracture is healed.  She would require preoperative medical clearance and would need to discuss this with her nephrologist to coordinate dialysis in the perioperative time period.She could have the plate removed as an outpt procedure.  Follow-Up Instructions: Return for pre-op she will call  .  fibula plate removal.   Orders:  Orders Placed This Encounter  Procedures  . XR Ankle Complete Right   No orders of the defined types were placed in this encounter.     Procedures: No procedures performed   Clinical Data: No additional findings.   Subjective: Chief Complaint  Patient presents with  . Right Ankle - Follow-up    10/06/18 ORIF Right Ankle Trimalleolar Fracture    HPI 48 year old female post trimalleolar ankle fracture fixation in October 2019 returns with persistent problems with painful lateral plate.  She had a severely comminuted fracture trimalleolar and had an anatomic large Biomet fibular plate applied with multiple locking and nonlocking screws.  She has had pain over the plate without irritation of her peroneal tendon.  She has had continued swelling of the ankle laterally over the plate but not over the ankle joint.  Patient has end-stage renal disease and is on chronic dialysis.  Past history of breast cancer acute on chronic heart failure.  Ankle fracture healed.  Due to the severe comminution after fixation she did have external fixator placed and then removed placed in a cast.  She has increased pain after standing for 15 minutes  and points directly over the plate where she has discomfort.  She would require medical clearance prior to her procedure.  Review of Systems new system positive for end-stage renal disease on chronic dialysis.  Diabetes, hypertension, history of acute on chronic diastolic heart failure.  For breast cancer.  Right ankle fracture fixation 10/06/2018.   Objective: Vital Signs: Ht 5\' 3"  (1.6 m)   Wt 240 lb (108.9 kg)   BMI 42.51 kg/m   Physical Exam Constitutional:      Appearance: She is well-developed.  HENT:     Head: Normocephalic.     Right Ear: External ear normal.     Left Ear: External ear normal.  Eyes:     Pupils: Pupils are equal, round, and reactive to light.  Neck:     Thyroid: No thyromegaly.     Trachea: No tracheal deviation.  Cardiovascular:     Rate and Rhythm: Normal rate.  Pulmonary:     Effort: Pulmonary effort is normal.  Abdominal:     Palpations: Abdomen is soft.  Skin:    General: Skin is warm and dry.  Neurological:     Mental Status: She is alert and oriented to person, place, and time.  Psychiatric:        Behavior: Behavior normal.     Ortho Exam patient has palpable pulse dorsalis pedis posterior tibial.  No  tenderness over the peroneal tendon.  Posterior tibial tendon is normal no tenderness over the deltoid ligament or medial malleolus.  Achilles tendon is normal no plantar foot lesions.  Specialty Comments:  No specialty comments available.  Imaging: Xr Ankle Complete Right  Result Date: 06/12/2019 Three-view x-rays right ankle AP lateral and mortise view obtained and reviewed.  This shows complete healing of the trimalleolar ankle fracture with medial malleolar screw.  Anterolateral screw and fibular plate.  Talus appears normal.  Fibula and medial malleolar as well as posterior malleolar fracture are healed.  DP and posterior tibial artery calcification noted Impression: Post-ORIF trimalleolar ankle fracture .     PMFS History: Patient  Active Problem List   Diagnosis Date Noted  . Closed right ankle fracture 10/06/2018  . Closed displaced trimalleolar fracture of right ankle 10/06/2018  . Acute colitis 02/26/2018  . Hypoxemia 02/24/2018  . Dyspnea 11/26/2017  . Anemia 11/26/2017  . Elevated troponin 11/26/2017  . Hyperkalemia 09/29/2017  . Acute respiratory failure ([place]) 09/28/2017  . CAP (community acquired pneumonia) 09/26/2017  . Volume overload 09/25/2017  . Acute on chronic diastolic CHF (congestive heart failure) ([place]) 09/25/2017  . Lactic acidosis 09/25/2017  . Hypokalemia 09/25/2017  . Breast cancer ([place]) 07/22/2017  . SOB (shortness of breath)   . Acute respiratory failure with hypoxia ([place]) 07/21/2017  . Flatulence 10/25/2016  . GERD (gastroesophageal reflux disease) 08/02/2016  . Pyloric stenosis, acquired   . Nausea with vomiting   . Generalized abdominal pain   . Chest pain, rule out acute myocardial infarction 07/04/2016  . Constipation 07/04/2016  . Nausea 07/04/2016  . Essential hypertension 07/04/2016  . HCAP (healthcare-associated pneumonia) 12/04/2015  . End stage renal disease on dialysis ([place]) 12/04/2015  . Diabetes mellitus ([place]) 12/04/2015   Past Medical History:  Diagnosis Date  . Anemia   . Ankle fracture   . Arthritis   . Blood transfusion without reported diagnosis   . Breast cancer ([place])   . Cancer ([place])   . Diabetes mellitus without complication ([place])   . Dialysis patient ([place])    mon, wed, friday,   . End stage renal disease on dialysis [facility])    M/W/F [facility] in [place]  . GERD (gastroesophageal reflux disease)   . Hypertension   . OSA (obstructive sleep apnea)    uses CPAP sometimes  . Pneumonia   . PONV (postoperative nausea and vomiting)   . Wears glasses     Family History  Problem Relation Age of Onset  . Diabetes Mellitus II Mother   . Hypertension Mother   . Hypertension Sister   . Hypertension Sister   . Colon cancer Neg Hx     Past Surgical  History:  Procedure Laterality Date  . ABDOMINAL HYSTERECTOMY    . AV FISTULA PLACEMENT  11/2014   at [place] N/A 07/10/2016   Procedure: BALLOON DILATION;  Surgeon: Danie Binder, MD;  [facility];  Service: Endoscopy;  Laterality: N/A;  Pyloric dilation  . BREAST LUMPECTOMY    . CESAREAN SECTION    . CHOLECYSTECTOMY    . COLONOSCOPY WITH PROPOFOL N/A 09/27/2016   Dr. Gala Romney: Internal hemorrhoids repeat colonoscopy in 10 years  . DILATION AND CURETTAGE OF UTERUS    . ESOPHAGOGASTRODUODENOSCOPY N/A 07/10/2016   Dr.Fields- normal esophagus, gastric stenosis was found at the pylorus, gastritis on bx, normal examined duodenun  . EXTERNAL FIXATION REMOVAL Right 10/29/2018   Procedure: REMOVAL RIGHT  ANKLE BIOMET ZIMMER EXTERNAL FIXATOR, SHORT LEG CAST APPLICATION;  Surgeon: Marybelle Killings, MD;  Location: [place];  Service: Orthopedics;  Laterality: Right;  . MASTECTOMY     left sided  . ORIF ANKLE FRACTURE Right 10/06/2018   Procedure: OPEN REDUCTION INTERNAL FIXATION (ORIF) RIGHT ANKLE TRIMALLEOLAR;  Surgeon: Marybelle Killings, MD;  Location: [facility];  Service: Orthopedics;  Laterality: Right;   Social History   Occupational History  . Not on file  Tobacco Use  . Smoking status: Never Smoker  . Smokeless tobacco: Never Used  Substance and Sexual Activity  . Alcohol use: No    Alcohol/week: 0.0 standard drinks  . Drug use: No  . Sexual activity: Not on file

## 2019-06-15 DIAGNOSIS — N2581 Secondary hyperparathyroidism of renal origin: Secondary | ICD-10-CM | POA: Diagnosis not present

## 2019-06-15 DIAGNOSIS — D509 Iron deficiency anemia, unspecified: Secondary | ICD-10-CM | POA: Diagnosis not present

## 2019-06-15 DIAGNOSIS — N186 End stage renal disease: Secondary | ICD-10-CM | POA: Diagnosis not present

## 2019-06-15 DIAGNOSIS — Z992 Dependence on renal dialysis: Secondary | ICD-10-CM | POA: Diagnosis not present

## 2019-06-15 DIAGNOSIS — D631 Anemia in chronic kidney disease: Secondary | ICD-10-CM | POA: Diagnosis not present

## 2019-06-16 DIAGNOSIS — N186 End stage renal disease: Secondary | ICD-10-CM | POA: Diagnosis not present

## 2019-06-16 DIAGNOSIS — D0512 Intraductal carcinoma in situ of left breast: Secondary | ICD-10-CM | POA: Diagnosis not present

## 2019-06-16 DIAGNOSIS — Z9012 Acquired absence of left breast and nipple: Secondary | ICD-10-CM | POA: Diagnosis not present

## 2019-06-16 DIAGNOSIS — Z992 Dependence on renal dialysis: Secondary | ICD-10-CM | POA: Diagnosis not present

## 2019-06-17 DIAGNOSIS — D509 Iron deficiency anemia, unspecified: Secondary | ICD-10-CM | POA: Diagnosis not present

## 2019-06-17 DIAGNOSIS — Z992 Dependence on renal dialysis: Secondary | ICD-10-CM | POA: Diagnosis not present

## 2019-06-17 DIAGNOSIS — D631 Anemia in chronic kidney disease: Secondary | ICD-10-CM | POA: Diagnosis not present

## 2019-06-17 DIAGNOSIS — N186 End stage renal disease: Secondary | ICD-10-CM | POA: Diagnosis not present

## 2019-06-17 DIAGNOSIS — N2581 Secondary hyperparathyroidism of renal origin: Secondary | ICD-10-CM | POA: Diagnosis not present

## 2019-06-18 ENCOUNTER — Ambulatory Visit: Payer: Medicare Other

## 2019-06-19 DIAGNOSIS — N2581 Secondary hyperparathyroidism of renal origin: Secondary | ICD-10-CM | POA: Diagnosis not present

## 2019-06-19 DIAGNOSIS — N186 End stage renal disease: Secondary | ICD-10-CM | POA: Diagnosis not present

## 2019-06-19 DIAGNOSIS — Z992 Dependence on renal dialysis: Secondary | ICD-10-CM | POA: Diagnosis not present

## 2019-06-19 DIAGNOSIS — D509 Iron deficiency anemia, unspecified: Secondary | ICD-10-CM | POA: Diagnosis not present

## 2019-06-19 DIAGNOSIS — D631 Anemia in chronic kidney disease: Secondary | ICD-10-CM | POA: Diagnosis not present

## 2019-06-22 DIAGNOSIS — D509 Iron deficiency anemia, unspecified: Secondary | ICD-10-CM | POA: Diagnosis not present

## 2019-06-22 DIAGNOSIS — Z992 Dependence on renal dialysis: Secondary | ICD-10-CM | POA: Diagnosis not present

## 2019-06-22 DIAGNOSIS — N186 End stage renal disease: Secondary | ICD-10-CM | POA: Diagnosis not present

## 2019-06-22 DIAGNOSIS — N2581 Secondary hyperparathyroidism of renal origin: Secondary | ICD-10-CM | POA: Diagnosis not present

## 2019-06-22 DIAGNOSIS — D631 Anemia in chronic kidney disease: Secondary | ICD-10-CM | POA: Diagnosis not present

## 2019-06-24 DIAGNOSIS — N2581 Secondary hyperparathyroidism of renal origin: Secondary | ICD-10-CM | POA: Diagnosis not present

## 2019-06-24 DIAGNOSIS — Z992 Dependence on renal dialysis: Secondary | ICD-10-CM | POA: Diagnosis not present

## 2019-06-24 DIAGNOSIS — N186 End stage renal disease: Secondary | ICD-10-CM | POA: Diagnosis not present

## 2019-06-24 DIAGNOSIS — D509 Iron deficiency anemia, unspecified: Secondary | ICD-10-CM | POA: Diagnosis not present

## 2019-06-24 DIAGNOSIS — D631 Anemia in chronic kidney disease: Secondary | ICD-10-CM | POA: Diagnosis not present

## 2019-06-26 DIAGNOSIS — Z992 Dependence on renal dialysis: Secondary | ICD-10-CM | POA: Diagnosis not present

## 2019-06-26 DIAGNOSIS — N186 End stage renal disease: Secondary | ICD-10-CM | POA: Diagnosis not present

## 2019-06-26 DIAGNOSIS — D509 Iron deficiency anemia, unspecified: Secondary | ICD-10-CM | POA: Diagnosis not present

## 2019-06-26 DIAGNOSIS — N2581 Secondary hyperparathyroidism of renal origin: Secondary | ICD-10-CM | POA: Diagnosis not present

## 2019-06-26 DIAGNOSIS — D631 Anemia in chronic kidney disease: Secondary | ICD-10-CM | POA: Diagnosis not present

## 2019-06-29 DIAGNOSIS — Z992 Dependence on renal dialysis: Secondary | ICD-10-CM | POA: Diagnosis not present

## 2019-06-29 DIAGNOSIS — R1084 Generalized abdominal pain: Secondary | ICD-10-CM | POA: Diagnosis not present

## 2019-06-29 DIAGNOSIS — D509 Iron deficiency anemia, unspecified: Secondary | ICD-10-CM | POA: Diagnosis not present

## 2019-06-29 DIAGNOSIS — D631 Anemia in chronic kidney disease: Secondary | ICD-10-CM | POA: Diagnosis not present

## 2019-06-29 DIAGNOSIS — N186 End stage renal disease: Secondary | ICD-10-CM | POA: Diagnosis not present

## 2019-06-29 DIAGNOSIS — N2581 Secondary hyperparathyroidism of renal origin: Secondary | ICD-10-CM | POA: Diagnosis not present

## 2019-06-30 LAB — BUN/CREATININE RATIO
BUN/Creatinine Ratio: 4 (calc) — ABNORMAL LOW (ref 6–22)
BUN: 24 mg/dL (ref 7–25)
Creat: 5.93 mg/dL — ABNORMAL HIGH (ref 0.50–1.10)
GFR, Est African American: 9 mL/min/{1.73_m2} — ABNORMAL LOW (ref >=60)
GFR, Est Non African American: 8 mL/min/{1.73_m2} — ABNORMAL LOW (ref >=60)

## 2019-07-01 DIAGNOSIS — H269 Unspecified cataract: Secondary | ICD-10-CM | POA: Diagnosis not present

## 2019-07-01 DIAGNOSIS — N2581 Secondary hyperparathyroidism of renal origin: Secondary | ICD-10-CM | POA: Diagnosis not present

## 2019-07-01 DIAGNOSIS — D509 Iron deficiency anemia, unspecified: Secondary | ICD-10-CM | POA: Diagnosis not present

## 2019-07-01 DIAGNOSIS — D631 Anemia in chronic kidney disease: Secondary | ICD-10-CM | POA: Diagnosis not present

## 2019-07-01 DIAGNOSIS — N186 End stage renal disease: Secondary | ICD-10-CM | POA: Diagnosis not present

## 2019-07-01 DIAGNOSIS — Z992 Dependence on renal dialysis: Secondary | ICD-10-CM | POA: Diagnosis not present

## 2019-07-01 DIAGNOSIS — E113513 Type 2 diabetes mellitus with proliferative diabetic retinopathy with macular edema, bilateral: Secondary | ICD-10-CM | POA: Diagnosis not present

## 2019-07-02 ENCOUNTER — Ambulatory Visit (HOSPITAL_COMMUNITY)
Admission: RE | Admit: 2019-07-02 | Discharge: 2019-07-02 | Disposition: A | Discharge status: Home / Self Care | Payer: Medicare Other | Source: Ambulatory Visit | Attending: Nurse Practitioner | Admitting: Nurse Practitioner

## 2019-07-02 ENCOUNTER — Other Ambulatory Visit: Payer: Self-pay

## 2019-07-02 DIAGNOSIS — K5909 Other constipation: Secondary | ICD-10-CM | POA: Insufficient documentation

## 2019-07-02 DIAGNOSIS — R1084 Generalized abdominal pain: Secondary | ICD-10-CM

## 2019-07-02 DIAGNOSIS — K5900 Constipation, unspecified: Secondary | ICD-10-CM | POA: Diagnosis not present

## 2019-07-02 DIAGNOSIS — R112 Nausea with vomiting, unspecified: Secondary | ICD-10-CM | POA: Diagnosis not present

## 2019-07-02 DIAGNOSIS — K5730 Diverticulosis of large intestine without perforation or abscess without bleeding: Secondary | ICD-10-CM | POA: Diagnosis not present

## 2019-07-02 MED ORDER — IOHEXOL 300 MG/ML  SOLN
100.0000 mL | Freq: Once | INTRAMUSCULAR | Status: AC | PRN
  Administered 2019-07-02: 100 mL via INTRAVENOUS

## 2019-07-03 DIAGNOSIS — N186 End stage renal disease: Secondary | ICD-10-CM | POA: Diagnosis not present

## 2019-07-03 DIAGNOSIS — D509 Iron deficiency anemia, unspecified: Secondary | ICD-10-CM | POA: Diagnosis not present

## 2019-07-03 DIAGNOSIS — N2581 Secondary hyperparathyroidism of renal origin: Secondary | ICD-10-CM | POA: Diagnosis not present

## 2019-07-03 DIAGNOSIS — Z992 Dependence on renal dialysis: Secondary | ICD-10-CM | POA: Diagnosis not present

## 2019-07-03 DIAGNOSIS — D631 Anemia in chronic kidney disease: Secondary | ICD-10-CM | POA: Diagnosis not present

## 2019-07-06 DIAGNOSIS — D631 Anemia in chronic kidney disease: Secondary | ICD-10-CM | POA: Diagnosis not present

## 2019-07-06 DIAGNOSIS — D509 Iron deficiency anemia, unspecified: Secondary | ICD-10-CM | POA: Diagnosis not present

## 2019-07-06 DIAGNOSIS — N186 End stage renal disease: Secondary | ICD-10-CM | POA: Diagnosis not present

## 2019-07-06 DIAGNOSIS — N2581 Secondary hyperparathyroidism of renal origin: Secondary | ICD-10-CM | POA: Diagnosis not present

## 2019-07-06 DIAGNOSIS — Z992 Dependence on renal dialysis: Secondary | ICD-10-CM | POA: Diagnosis not present

## 2019-07-08 DIAGNOSIS — D631 Anemia in chronic kidney disease: Secondary | ICD-10-CM | POA: Diagnosis not present

## 2019-07-08 DIAGNOSIS — D509 Iron deficiency anemia, unspecified: Secondary | ICD-10-CM | POA: Diagnosis not present

## 2019-07-08 DIAGNOSIS — N186 End stage renal disease: Secondary | ICD-10-CM | POA: Diagnosis not present

## 2019-07-08 DIAGNOSIS — N2581 Secondary hyperparathyroidism of renal origin: Secondary | ICD-10-CM | POA: Diagnosis not present

## 2019-07-08 DIAGNOSIS — Z992 Dependence on renal dialysis: Secondary | ICD-10-CM | POA: Diagnosis not present

## 2019-07-10 DIAGNOSIS — N2581 Secondary hyperparathyroidism of renal origin: Secondary | ICD-10-CM | POA: Diagnosis not present

## 2019-07-10 DIAGNOSIS — Z992 Dependence on renal dialysis: Secondary | ICD-10-CM | POA: Diagnosis not present

## 2019-07-10 DIAGNOSIS — N186 End stage renal disease: Secondary | ICD-10-CM | POA: Diagnosis not present

## 2019-07-10 DIAGNOSIS — D509 Iron deficiency anemia, unspecified: Secondary | ICD-10-CM | POA: Diagnosis not present

## 2019-07-10 DIAGNOSIS — D631 Anemia in chronic kidney disease: Secondary | ICD-10-CM | POA: Diagnosis not present

## 2019-07-13 DIAGNOSIS — N186 End stage renal disease: Secondary | ICD-10-CM | POA: Diagnosis not present

## 2019-07-13 DIAGNOSIS — Z992 Dependence on renal dialysis: Secondary | ICD-10-CM | POA: Diagnosis not present

## 2019-07-13 DIAGNOSIS — D631 Anemia in chronic kidney disease: Secondary | ICD-10-CM | POA: Diagnosis not present

## 2019-07-13 DIAGNOSIS — D509 Iron deficiency anemia, unspecified: Secondary | ICD-10-CM | POA: Diagnosis not present

## 2019-07-13 DIAGNOSIS — N2581 Secondary hyperparathyroidism of renal origin: Secondary | ICD-10-CM | POA: Diagnosis not present

## 2019-07-13 NOTE — Progress Notes (Signed)
REVIEWED-NO ADDITIONAL RECOMMENDATIONS. 

## 2019-07-14 ENCOUNTER — Other Ambulatory Visit: Payer: Self-pay | Admitting: Nurse Practitioner

## 2019-07-14 DIAGNOSIS — R1084 Generalized abdominal pain: Secondary | ICD-10-CM

## 2019-07-14 MED ORDER — DICYCLOMINE HCL 10 MG PO CAPS: 10.0000 mg | ORAL_CAPSULE | Freq: Two times a day (BID) | ORAL | 2 refills | Status: DC | PRN

## 2019-07-15 DIAGNOSIS — D509 Iron deficiency anemia, unspecified: Secondary | ICD-10-CM | POA: Diagnosis not present

## 2019-07-15 DIAGNOSIS — N2581 Secondary hyperparathyroidism of renal origin: Secondary | ICD-10-CM | POA: Diagnosis not present

## 2019-07-15 DIAGNOSIS — E113513 Type 2 diabetes mellitus with proliferative diabetic retinopathy with macular edema, bilateral: Secondary | ICD-10-CM | POA: Diagnosis not present

## 2019-07-15 DIAGNOSIS — Z992 Dependence on renal dialysis: Secondary | ICD-10-CM | POA: Diagnosis not present

## 2019-07-15 DIAGNOSIS — D631 Anemia in chronic kidney disease: Secondary | ICD-10-CM | POA: Diagnosis not present

## 2019-07-15 DIAGNOSIS — E1136 Type 2 diabetes mellitus with diabetic cataract: Secondary | ICD-10-CM | POA: Diagnosis not present

## 2019-07-15 DIAGNOSIS — N186 End stage renal disease: Secondary | ICD-10-CM | POA: Diagnosis not present

## 2019-07-17 DIAGNOSIS — Z992 Dependence on renal dialysis: Secondary | ICD-10-CM | POA: Diagnosis not present

## 2019-07-17 DIAGNOSIS — N186 End stage renal disease: Secondary | ICD-10-CM | POA: Diagnosis not present

## 2019-07-17 DIAGNOSIS — D509 Iron deficiency anemia, unspecified: Secondary | ICD-10-CM | POA: Diagnosis not present

## 2019-07-17 DIAGNOSIS — N2581 Secondary hyperparathyroidism of renal origin: Secondary | ICD-10-CM | POA: Diagnosis not present

## 2019-07-17 DIAGNOSIS — D631 Anemia in chronic kidney disease: Secondary | ICD-10-CM | POA: Diagnosis not present

## 2019-07-20 DIAGNOSIS — D631 Anemia in chronic kidney disease: Secondary | ICD-10-CM | POA: Diagnosis not present

## 2019-07-20 DIAGNOSIS — Z992 Dependence on renal dialysis: Secondary | ICD-10-CM | POA: Diagnosis not present

## 2019-07-20 DIAGNOSIS — D509 Iron deficiency anemia, unspecified: Secondary | ICD-10-CM | POA: Diagnosis not present

## 2019-07-20 DIAGNOSIS — N2581 Secondary hyperparathyroidism of renal origin: Secondary | ICD-10-CM | POA: Diagnosis not present

## 2019-07-20 DIAGNOSIS — N186 End stage renal disease: Secondary | ICD-10-CM | POA: Diagnosis not present

## 2019-07-22 DIAGNOSIS — D631 Anemia in chronic kidney disease: Secondary | ICD-10-CM | POA: Diagnosis not present

## 2019-07-22 DIAGNOSIS — Z992 Dependence on renal dialysis: Secondary | ICD-10-CM | POA: Diagnosis not present

## 2019-07-22 DIAGNOSIS — D509 Iron deficiency anemia, unspecified: Secondary | ICD-10-CM | POA: Diagnosis not present

## 2019-07-22 DIAGNOSIS — N2581 Secondary hyperparathyroidism of renal origin: Secondary | ICD-10-CM | POA: Diagnosis not present

## 2019-07-22 DIAGNOSIS — N186 End stage renal disease: Secondary | ICD-10-CM | POA: Diagnosis not present

## 2019-07-23 ENCOUNTER — Telehealth: Payer: Self-pay

## 2019-07-23 NOTE — Telephone Encounter (Signed)
Opened in error

## 2019-07-24 DIAGNOSIS — Z992 Dependence on renal dialysis: Secondary | ICD-10-CM | POA: Diagnosis not present

## 2019-07-24 DIAGNOSIS — N186 End stage renal disease: Secondary | ICD-10-CM | POA: Diagnosis not present

## 2019-07-24 DIAGNOSIS — D631 Anemia in chronic kidney disease: Secondary | ICD-10-CM | POA: Diagnosis not present

## 2019-07-24 DIAGNOSIS — N2581 Secondary hyperparathyroidism of renal origin: Secondary | ICD-10-CM | POA: Diagnosis not present

## 2019-07-24 DIAGNOSIS — D509 Iron deficiency anemia, unspecified: Secondary | ICD-10-CM | POA: Diagnosis not present

## 2019-07-27 DIAGNOSIS — Z992 Dependence on renal dialysis: Secondary | ICD-10-CM | POA: Diagnosis not present

## 2019-07-27 DIAGNOSIS — D631 Anemia in chronic kidney disease: Secondary | ICD-10-CM | POA: Diagnosis not present

## 2019-07-27 DIAGNOSIS — N186 End stage renal disease: Secondary | ICD-10-CM | POA: Diagnosis not present

## 2019-07-27 DIAGNOSIS — N2581 Secondary hyperparathyroidism of renal origin: Secondary | ICD-10-CM | POA: Diagnosis not present

## 2019-07-27 DIAGNOSIS — D509 Iron deficiency anemia, unspecified: Secondary | ICD-10-CM | POA: Diagnosis not present

## 2019-07-29 DIAGNOSIS — D631 Anemia in chronic kidney disease: Secondary | ICD-10-CM | POA: Diagnosis not present

## 2019-07-29 DIAGNOSIS — N186 End stage renal disease: Secondary | ICD-10-CM | POA: Diagnosis not present

## 2019-07-29 DIAGNOSIS — Z992 Dependence on renal dialysis: Secondary | ICD-10-CM | POA: Diagnosis not present

## 2019-07-29 DIAGNOSIS — D509 Iron deficiency anemia, unspecified: Secondary | ICD-10-CM | POA: Diagnosis not present

## 2019-07-29 DIAGNOSIS — N2581 Secondary hyperparathyroidism of renal origin: Secondary | ICD-10-CM | POA: Diagnosis not present

## 2019-07-31 DIAGNOSIS — N186 End stage renal disease: Secondary | ICD-10-CM | POA: Diagnosis not present

## 2019-07-31 DIAGNOSIS — D631 Anemia in chronic kidney disease: Secondary | ICD-10-CM | POA: Diagnosis not present

## 2019-07-31 DIAGNOSIS — Z992 Dependence on renal dialysis: Secondary | ICD-10-CM | POA: Diagnosis not present

## 2019-07-31 DIAGNOSIS — D509 Iron deficiency anemia, unspecified: Secondary | ICD-10-CM | POA: Diagnosis not present

## 2019-07-31 DIAGNOSIS — N2581 Secondary hyperparathyroidism of renal origin: Secondary | ICD-10-CM | POA: Diagnosis not present

## 2019-08-03 DIAGNOSIS — N2581 Secondary hyperparathyroidism of renal origin: Secondary | ICD-10-CM | POA: Diagnosis not present

## 2019-08-03 DIAGNOSIS — D509 Iron deficiency anemia, unspecified: Secondary | ICD-10-CM | POA: Diagnosis not present

## 2019-08-03 DIAGNOSIS — N186 End stage renal disease: Secondary | ICD-10-CM | POA: Diagnosis not present

## 2019-08-03 DIAGNOSIS — Z992 Dependence on renal dialysis: Secondary | ICD-10-CM | POA: Diagnosis not present

## 2019-08-03 DIAGNOSIS — D631 Anemia in chronic kidney disease: Secondary | ICD-10-CM | POA: Diagnosis not present

## 2019-08-05 DIAGNOSIS — Z992 Dependence on renal dialysis: Secondary | ICD-10-CM | POA: Diagnosis not present

## 2019-08-05 DIAGNOSIS — N186 End stage renal disease: Secondary | ICD-10-CM | POA: Diagnosis not present

## 2019-08-05 DIAGNOSIS — D631 Anemia in chronic kidney disease: Secondary | ICD-10-CM | POA: Diagnosis not present

## 2019-08-05 DIAGNOSIS — D509 Iron deficiency anemia, unspecified: Secondary | ICD-10-CM | POA: Diagnosis not present

## 2019-08-05 DIAGNOSIS — N2581 Secondary hyperparathyroidism of renal origin: Secondary | ICD-10-CM | POA: Diagnosis not present

## 2019-08-07 DIAGNOSIS — N186 End stage renal disease: Secondary | ICD-10-CM | POA: Diagnosis not present

## 2019-08-07 DIAGNOSIS — D631 Anemia in chronic kidney disease: Secondary | ICD-10-CM | POA: Diagnosis not present

## 2019-08-07 DIAGNOSIS — D509 Iron deficiency anemia, unspecified: Secondary | ICD-10-CM | POA: Diagnosis not present

## 2019-08-07 DIAGNOSIS — N2581 Secondary hyperparathyroidism of renal origin: Secondary | ICD-10-CM | POA: Diagnosis not present

## 2019-08-07 DIAGNOSIS — Z992 Dependence on renal dialysis: Secondary | ICD-10-CM | POA: Diagnosis not present

## 2019-08-10 DIAGNOSIS — N2581 Secondary hyperparathyroidism of renal origin: Secondary | ICD-10-CM | POA: Diagnosis not present

## 2019-08-10 DIAGNOSIS — D631 Anemia in chronic kidney disease: Secondary | ICD-10-CM | POA: Diagnosis not present

## 2019-08-10 DIAGNOSIS — Z992 Dependence on renal dialysis: Secondary | ICD-10-CM | POA: Diagnosis not present

## 2019-08-10 DIAGNOSIS — N186 End stage renal disease: Secondary | ICD-10-CM | POA: Diagnosis not present

## 2019-08-10 DIAGNOSIS — D509 Iron deficiency anemia, unspecified: Secondary | ICD-10-CM | POA: Diagnosis not present

## 2019-08-12 DIAGNOSIS — N186 End stage renal disease: Secondary | ICD-10-CM | POA: Diagnosis not present

## 2019-08-12 DIAGNOSIS — D509 Iron deficiency anemia, unspecified: Secondary | ICD-10-CM | POA: Diagnosis not present

## 2019-08-12 DIAGNOSIS — Z992 Dependence on renal dialysis: Secondary | ICD-10-CM | POA: Diagnosis not present

## 2019-08-12 DIAGNOSIS — N2581 Secondary hyperparathyroidism of renal origin: Secondary | ICD-10-CM | POA: Diagnosis not present

## 2019-08-12 DIAGNOSIS — D631 Anemia in chronic kidney disease: Secondary | ICD-10-CM | POA: Diagnosis not present

## 2019-08-13 ENCOUNTER — Ambulatory Visit (INDEPENDENT_AMBULATORY_CARE_PROVIDER_SITE_OTHER): Payer: Medicare Other | Admitting: Nurse Practitioner

## 2019-08-13 ENCOUNTER — Other Ambulatory Visit: Payer: Self-pay

## 2019-08-13 ENCOUNTER — Encounter: Payer: Self-pay | Admitting: Nurse Practitioner

## 2019-08-13 VITALS — BP 157/82 | HR 84 | Temp 96.6°F | Ht 63.0 in | Wt 241.8 lb

## 2019-08-13 DIAGNOSIS — R1084 Generalized abdominal pain: Secondary | ICD-10-CM | POA: Diagnosis not present

## 2019-08-13 DIAGNOSIS — R112 Nausea with vomiting, unspecified: Secondary | ICD-10-CM | POA: Diagnosis not present

## 2019-08-13 DIAGNOSIS — K5909 Other constipation: Secondary | ICD-10-CM

## 2019-08-13 NOTE — Assessment & Plan Note (Signed)
The patient has a history of chronic constipation.  Amitiza has worked best for her although her insurance will not cover this.  She is currently on Linzess 290 mcg which she takes once daily.  Still needs something extra and will take MiraLAX once a day as needed.  In the past week or 2 she has needed this almost every day.  She is requesting something that does not require fluids as she is on a fluid restriction with end-stage renal disease and hemodialysis.  Recommend she try fiber supplement once a day for 2 weeks and then increase to twice a day after that.  Call if persistent constipation despite Linzess and fiber supplement.  At that point we can probably start a stool softener and see if that has any better effect.  Continue her other medications and follow-up in 3 months.

## 2019-08-13 NOTE — Assessment & Plan Note (Signed)
Intermittent/uncommon nausea with vomiting, typically in the morning times.  Has Zofran ODT at home which tends to work well for her.  Recommend she continue to take this and call us if she needs a refill.  Follow-up in 3 months.

## 2019-08-13 NOTE — Patient Instructions (Signed)
Your health issues we discussed today were:   Constipation and abdominal pain: 1. Continue taking Linzess 290 mcg daily 2. Start taking a fiber supplement once a day for 2 weeks 3. After 2 weeks increase this to twice a day 4. Continue to use dicyclomine (Bentyl) for abdominal pain 5. Call if you are having persistent constipation despite Linzess and fiber supplement twice a day 6. Call us for any worsening or severe symptoms  Nausea and vomiting: 1. Continue to use Zofran "dissolving tablet" as needed for nausea 2. Call should he have any worsening or severe symptoms, especially if you are unable to keep down food or fluids  Overall I recommend:  1. Continue your other current medications 2. Return for follow-up in 3 months 3. Call us if you have any questions or concerns.   Because of recent events of COVID-19 ("Coronavirus"), follow CDC recommendations:  1. Wash your hand frequently 2. Avoid touching your face 3. Stay away from people who are sick 4. If you have symptoms such as fever, cough, shortness of breath then call your healthcare provider for further guidance 5. If you are sick, STAY AT HOME unless otherwise directed by your healthcare provider. 6. Follow directions from state and national officials regarding staying safe   At [facility] we value your feedback. You may receive a survey about your visit today. Please share your experience as we strive to create trusting relationships with our patients to provide genuine, compassionate, quality care.  We appreciate your understanding and patience as we review any laboratory studies, imaging, and other diagnostic tests that are ordered as we care for you. Our office policy is 5 business days for review of these results, and any emergent or urgent results are addressed in a timely manner for your best interest. If you do not hear from our office in 1 week, please contact us.   We also encourage the use of  MyChart, which contains your medical information for your review as well. If you are not enrolled in this feature, an access code is on this after visit summary for your convenience. Thank you for allowing [place] to be involved in your care.  It was great to see you today!  I hope you have a great summer!!

## 2019-08-13 NOTE — Assessment & Plan Note (Signed)
Continued intermittent "spasming" type abdominal pain in her generalized abdomen.  She states this usually starts with a sensation of gas moving around her intestines and then will "locking" on one side or the other and cause pain.  Dicyclomine has helped this previously.  Recommend she continue this.  Her most recent episode she was able to become unhooked from dialysis and go to the bathroom at which point she passed a lot of gas and some small amount of stool and felt relief afterward.  Call if any persistent abdominal pain that will not improve with medications.  Follow-up in 3 months.
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
## 2019-08-14 DIAGNOSIS — Z992 Dependence on renal dialysis: Secondary | ICD-10-CM | POA: Diagnosis not present

## 2019-08-14 DIAGNOSIS — D509 Iron deficiency anemia, unspecified: Secondary | ICD-10-CM | POA: Diagnosis not present

## 2019-08-14 DIAGNOSIS — N2581 Secondary hyperparathyroidism of renal origin: Secondary | ICD-10-CM | POA: Diagnosis not present

## 2019-08-14 DIAGNOSIS — D631 Anemia in chronic kidney disease: Secondary | ICD-10-CM | POA: Diagnosis not present

## 2019-08-14 DIAGNOSIS — N186 End stage renal disease: Secondary | ICD-10-CM | POA: Diagnosis not present

## 2019-08-17 DIAGNOSIS — D509 Iron deficiency anemia, unspecified: Secondary | ICD-10-CM | POA: Diagnosis not present

## 2019-08-17 DIAGNOSIS — N2581 Secondary hyperparathyroidism of renal origin: Secondary | ICD-10-CM | POA: Diagnosis not present

## 2019-08-17 DIAGNOSIS — N186 End stage renal disease: Secondary | ICD-10-CM | POA: Diagnosis not present

## 2019-08-17 DIAGNOSIS — D631 Anemia in chronic kidney disease: Secondary | ICD-10-CM | POA: Diagnosis not present

## 2019-08-17 DIAGNOSIS — Z992 Dependence on renal dialysis: Secondary | ICD-10-CM | POA: Diagnosis not present

## 2019-08-19 DIAGNOSIS — D631 Anemia in chronic kidney disease: Secondary | ICD-10-CM | POA: Diagnosis not present

## 2019-08-19 DIAGNOSIS — D509 Iron deficiency anemia, unspecified: Secondary | ICD-10-CM | POA: Diagnosis not present

## 2019-08-19 DIAGNOSIS — N2581 Secondary hyperparathyroidism of renal origin: Secondary | ICD-10-CM | POA: Diagnosis not present

## 2019-08-19 DIAGNOSIS — Z992 Dependence on renal dialysis: Secondary | ICD-10-CM | POA: Diagnosis not present

## 2019-08-19 DIAGNOSIS — N186 End stage renal disease: Secondary | ICD-10-CM | POA: Diagnosis not present

## 2019-08-21 DIAGNOSIS — N186 End stage renal disease: Secondary | ICD-10-CM | POA: Diagnosis not present

## 2019-08-21 DIAGNOSIS — N2581 Secondary hyperparathyroidism of renal origin: Secondary | ICD-10-CM | POA: Diagnosis not present

## 2019-08-21 DIAGNOSIS — D509 Iron deficiency anemia, unspecified: Secondary | ICD-10-CM | POA: Diagnosis not present

## 2019-08-21 DIAGNOSIS — D631 Anemia in chronic kidney disease: Secondary | ICD-10-CM | POA: Diagnosis not present

## 2019-08-21 DIAGNOSIS — Z992 Dependence on renal dialysis: Secondary | ICD-10-CM | POA: Diagnosis not present

## 2019-08-24 DIAGNOSIS — D631 Anemia in chronic kidney disease: Secondary | ICD-10-CM | POA: Diagnosis not present

## 2019-08-24 DIAGNOSIS — N186 End stage renal disease: Secondary | ICD-10-CM | POA: Diagnosis not present

## 2019-08-24 DIAGNOSIS — Z992 Dependence on renal dialysis: Secondary | ICD-10-CM | POA: Diagnosis not present

## 2019-08-24 DIAGNOSIS — N2581 Secondary hyperparathyroidism of renal origin: Secondary | ICD-10-CM | POA: Diagnosis not present

## 2019-08-24 DIAGNOSIS — D509 Iron deficiency anemia, unspecified: Secondary | ICD-10-CM | POA: Diagnosis not present

## 2019-08-26 DIAGNOSIS — N186 End stage renal disease: Secondary | ICD-10-CM | POA: Diagnosis not present

## 2019-08-26 DIAGNOSIS — D509 Iron deficiency anemia, unspecified: Secondary | ICD-10-CM | POA: Diagnosis not present

## 2019-08-26 DIAGNOSIS — N2581 Secondary hyperparathyroidism of renal origin: Secondary | ICD-10-CM | POA: Diagnosis not present

## 2019-08-26 DIAGNOSIS — D631 Anemia in chronic kidney disease: Secondary | ICD-10-CM | POA: Diagnosis not present

## 2019-08-26 DIAGNOSIS — Z992 Dependence on renal dialysis: Secondary | ICD-10-CM | POA: Diagnosis not present

## 2019-08-27 ENCOUNTER — Telehealth: Payer: Self-pay | Admitting: Internal Medicine

## 2019-08-27 DIAGNOSIS — K5909 Other constipation: Secondary | ICD-10-CM

## 2019-08-27 NOTE — Telephone Encounter (Signed)
Spoke with pt. She is taking Linzess one pill prior to breakfast. Pt is also taking fiber choice chews bid with Miralax. pt feels adding the extra fiber hasn't made her bowels move. Pt states that she isn't having normal bowel movements. Pt is having a bowel movement every 3 days. When pt has a bowel movement, it's very hard and pt isn't emptying enough. Pt was asked if she is taking the Bentyl and pt states that she hasn't taken the Bentyl in a while.

## 2019-08-27 NOTE — Telephone Encounter (Signed)
[phone] PLEASE CALL PATIENT, SHE SAID THAT SHE HAS ADDED EXTRA FIBER TO HER DIET BUT IT HAS NOT HELPED

## 2019-08-28 DIAGNOSIS — D631 Anemia in chronic kidney disease: Secondary | ICD-10-CM | POA: Diagnosis not present

## 2019-08-28 DIAGNOSIS — N2581 Secondary hyperparathyroidism of renal origin: Secondary | ICD-10-CM | POA: Diagnosis not present

## 2019-08-28 DIAGNOSIS — N186 End stage renal disease: Secondary | ICD-10-CM | POA: Diagnosis not present

## 2019-08-28 DIAGNOSIS — Z992 Dependence on renal dialysis: Secondary | ICD-10-CM | POA: Diagnosis not present

## 2019-08-28 DIAGNOSIS — D509 Iron deficiency anemia, unspecified: Secondary | ICD-10-CM | POA: Diagnosis not present

## 2019-08-31 DIAGNOSIS — N186 End stage renal disease: Secondary | ICD-10-CM | POA: Diagnosis not present

## 2019-08-31 DIAGNOSIS — D631 Anemia in chronic kidney disease: Secondary | ICD-10-CM | POA: Diagnosis not present

## 2019-08-31 DIAGNOSIS — D509 Iron deficiency anemia, unspecified: Secondary | ICD-10-CM | POA: Diagnosis not present

## 2019-08-31 DIAGNOSIS — Z992 Dependence on renal dialysis: Secondary | ICD-10-CM | POA: Diagnosis not present

## 2019-08-31 DIAGNOSIS — N2581 Secondary hyperparathyroidism of renal origin: Secondary | ICD-10-CM | POA: Diagnosis not present

## 2019-09-01 NOTE — Telephone Encounter (Signed)
I can try her on a 1 month Rx for Trulance to see if this works any better. Unfortunately we do not have any samples. This would be INSTEAD OF Linzess.  Let me know if she's ok with this and I can send in the Rx.

## 2019-09-02 DIAGNOSIS — D631 Anemia in chronic kidney disease: Secondary | ICD-10-CM | POA: Diagnosis not present

## 2019-09-02 DIAGNOSIS — N186 End stage renal disease: Secondary | ICD-10-CM | POA: Diagnosis not present

## 2019-09-02 DIAGNOSIS — N2581 Secondary hyperparathyroidism of renal origin: Secondary | ICD-10-CM | POA: Diagnosis not present

## 2019-09-02 DIAGNOSIS — Z992 Dependence on renal dialysis: Secondary | ICD-10-CM | POA: Diagnosis not present

## 2019-09-02 DIAGNOSIS — D509 Iron deficiency anemia, unspecified: Secondary | ICD-10-CM | POA: Diagnosis not present

## 2019-09-02 MED ORDER — TRULANCE 3 MG PO TABS: 3.0000 mg | ORAL_TABLET | Freq: Every day | ORAL | 1 refills | Status: DC

## 2019-09-02 NOTE — Addendum Note (Signed)
Addended by: Gordy Levan, Stevana Dufner A on: 09/02/2019 04:43 PM   Modules accepted: Orders

## 2019-09-02 NOTE — Telephone Encounter (Signed)
Rx for 1 month with 1 refill sent to pharmacy. Call with a progress report in 2 weeks.

## 2019-09-02 NOTE — Telephone Encounter (Signed)
Spoke with pt and she would like to try Trulance. Please send to pt's pharmacy.

## 2019-09-04 DIAGNOSIS — D631 Anemia in chronic kidney disease: Secondary | ICD-10-CM | POA: Diagnosis not present

## 2019-09-04 DIAGNOSIS — Z992 Dependence on renal dialysis: Secondary | ICD-10-CM | POA: Diagnosis not present

## 2019-09-04 DIAGNOSIS — N186 End stage renal disease: Secondary | ICD-10-CM | POA: Diagnosis not present

## 2019-09-04 DIAGNOSIS — N2581 Secondary hyperparathyroidism of renal origin: Secondary | ICD-10-CM | POA: Diagnosis not present

## 2019-09-04 DIAGNOSIS — D509 Iron deficiency anemia, unspecified: Secondary | ICD-10-CM | POA: Diagnosis not present

## 2019-09-07 DIAGNOSIS — D509 Iron deficiency anemia, unspecified: Secondary | ICD-10-CM | POA: Diagnosis not present

## 2019-09-07 DIAGNOSIS — D631 Anemia in chronic kidney disease: Secondary | ICD-10-CM | POA: Diagnosis not present

## 2019-09-07 DIAGNOSIS — Z992 Dependence on renal dialysis: Secondary | ICD-10-CM | POA: Diagnosis not present

## 2019-09-07 DIAGNOSIS — N2581 Secondary hyperparathyroidism of renal origin: Secondary | ICD-10-CM | POA: Diagnosis not present

## 2019-09-07 DIAGNOSIS — N186 End stage renal disease: Secondary | ICD-10-CM | POA: Diagnosis not present

## 2019-09-09 DIAGNOSIS — Z992 Dependence on renal dialysis: Secondary | ICD-10-CM | POA: Diagnosis not present

## 2019-09-09 DIAGNOSIS — D631 Anemia in chronic kidney disease: Secondary | ICD-10-CM | POA: Diagnosis not present

## 2019-09-09 DIAGNOSIS — D509 Iron deficiency anemia, unspecified: Secondary | ICD-10-CM | POA: Diagnosis not present

## 2019-09-09 DIAGNOSIS — N2581 Secondary hyperparathyroidism of renal origin: Secondary | ICD-10-CM | POA: Diagnosis not present

## 2019-09-09 DIAGNOSIS — N186 End stage renal disease: Secondary | ICD-10-CM | POA: Diagnosis not present

## 2019-09-11 DIAGNOSIS — D631 Anemia in chronic kidney disease: Secondary | ICD-10-CM | POA: Diagnosis not present

## 2019-09-11 DIAGNOSIS — N186 End stage renal disease: Secondary | ICD-10-CM | POA: Diagnosis not present

## 2019-09-11 DIAGNOSIS — D509 Iron deficiency anemia, unspecified: Secondary | ICD-10-CM | POA: Diagnosis not present

## 2019-09-11 DIAGNOSIS — Z992 Dependence on renal dialysis: Secondary | ICD-10-CM | POA: Diagnosis not present

## 2019-09-11 DIAGNOSIS — N2581 Secondary hyperparathyroidism of renal origin: Secondary | ICD-10-CM | POA: Diagnosis not present

## 2019-09-14 DIAGNOSIS — D631 Anemia in chronic kidney disease: Secondary | ICD-10-CM | POA: Diagnosis not present

## 2019-09-14 DIAGNOSIS — Z992 Dependence on renal dialysis: Secondary | ICD-10-CM | POA: Diagnosis not present

## 2019-09-14 DIAGNOSIS — D509 Iron deficiency anemia, unspecified: Secondary | ICD-10-CM | POA: Diagnosis not present

## 2019-09-14 DIAGNOSIS — N186 End stage renal disease: Secondary | ICD-10-CM | POA: Diagnosis not present

## 2019-09-14 DIAGNOSIS — N2581 Secondary hyperparathyroidism of renal origin: Secondary | ICD-10-CM | POA: Diagnosis not present

## 2019-09-16 DIAGNOSIS — Z992 Dependence on renal dialysis: Secondary | ICD-10-CM | POA: Diagnosis not present

## 2019-09-16 DIAGNOSIS — D509 Iron deficiency anemia, unspecified: Secondary | ICD-10-CM | POA: Diagnosis not present

## 2019-09-16 DIAGNOSIS — N2581 Secondary hyperparathyroidism of renal origin: Secondary | ICD-10-CM | POA: Diagnosis not present

## 2019-09-16 DIAGNOSIS — D631 Anemia in chronic kidney disease: Secondary | ICD-10-CM | POA: Diagnosis not present

## 2019-09-16 DIAGNOSIS — N186 End stage renal disease: Secondary | ICD-10-CM | POA: Diagnosis not present

## 2019-09-18 DIAGNOSIS — R112 Nausea with vomiting, unspecified: Secondary | ICD-10-CM | POA: Diagnosis not present

## 2019-09-18 DIAGNOSIS — Z992 Dependence on renal dialysis: Secondary | ICD-10-CM | POA: Diagnosis not present

## 2019-09-18 DIAGNOSIS — Z23 Encounter for immunization: Secondary | ICD-10-CM | POA: Diagnosis not present

## 2019-09-18 DIAGNOSIS — D509 Iron deficiency anemia, unspecified: Secondary | ICD-10-CM | POA: Diagnosis not present

## 2019-09-18 DIAGNOSIS — N2581 Secondary hyperparathyroidism of renal origin: Secondary | ICD-10-CM | POA: Diagnosis not present

## 2019-09-18 DIAGNOSIS — N186 End stage renal disease: Secondary | ICD-10-CM | POA: Diagnosis not present

## 2019-09-21 DIAGNOSIS — N186 End stage renal disease: Secondary | ICD-10-CM | POA: Diagnosis not present

## 2019-09-21 DIAGNOSIS — N2581 Secondary hyperparathyroidism of renal origin: Secondary | ICD-10-CM | POA: Diagnosis not present

## 2019-09-21 DIAGNOSIS — Z992 Dependence on renal dialysis: Secondary | ICD-10-CM | POA: Diagnosis not present

## 2019-09-21 DIAGNOSIS — R112 Nausea with vomiting, unspecified: Secondary | ICD-10-CM | POA: Diagnosis not present

## 2019-09-21 DIAGNOSIS — Z23 Encounter for immunization: Secondary | ICD-10-CM | POA: Diagnosis not present

## 2019-09-21 DIAGNOSIS — D509 Iron deficiency anemia, unspecified: Secondary | ICD-10-CM | POA: Diagnosis not present

## 2019-09-23 DIAGNOSIS — N186 End stage renal disease: Secondary | ICD-10-CM | POA: Diagnosis not present

## 2019-09-23 DIAGNOSIS — N2581 Secondary hyperparathyroidism of renal origin: Secondary | ICD-10-CM | POA: Diagnosis not present

## 2019-09-23 DIAGNOSIS — D509 Iron deficiency anemia, unspecified: Secondary | ICD-10-CM | POA: Diagnosis not present

## 2019-09-23 DIAGNOSIS — R112 Nausea with vomiting, unspecified: Secondary | ICD-10-CM | POA: Diagnosis not present

## 2019-09-23 DIAGNOSIS — Z23 Encounter for immunization: Secondary | ICD-10-CM | POA: Diagnosis not present

## 2019-09-23 DIAGNOSIS — Z992 Dependence on renal dialysis: Secondary | ICD-10-CM | POA: Diagnosis not present

## 2019-09-25 DIAGNOSIS — Z23 Encounter for immunization: Secondary | ICD-10-CM | POA: Diagnosis not present

## 2019-09-25 DIAGNOSIS — D509 Iron deficiency anemia, unspecified: Secondary | ICD-10-CM | POA: Diagnosis not present

## 2019-09-25 DIAGNOSIS — N2581 Secondary hyperparathyroidism of renal origin: Secondary | ICD-10-CM | POA: Diagnosis not present

## 2019-09-25 DIAGNOSIS — N186 End stage renal disease: Secondary | ICD-10-CM | POA: Diagnosis not present

## 2019-09-25 DIAGNOSIS — Z992 Dependence on renal dialysis: Secondary | ICD-10-CM | POA: Diagnosis not present

## 2019-09-25 DIAGNOSIS — R112 Nausea with vomiting, unspecified: Secondary | ICD-10-CM | POA: Diagnosis not present

## 2019-09-28 DIAGNOSIS — Z992 Dependence on renal dialysis: Secondary | ICD-10-CM | POA: Diagnosis not present

## 2019-09-28 DIAGNOSIS — Z23 Encounter for immunization: Secondary | ICD-10-CM | POA: Diagnosis not present

## 2019-09-28 DIAGNOSIS — N186 End stage renal disease: Secondary | ICD-10-CM | POA: Diagnosis not present

## 2019-09-28 DIAGNOSIS — N2581 Secondary hyperparathyroidism of renal origin: Secondary | ICD-10-CM | POA: Diagnosis not present

## 2019-09-28 DIAGNOSIS — D509 Iron deficiency anemia, unspecified: Secondary | ICD-10-CM | POA: Diagnosis not present

## 2019-09-28 DIAGNOSIS — R112 Nausea with vomiting, unspecified: Secondary | ICD-10-CM | POA: Diagnosis not present

## 2019-09-30 DIAGNOSIS — Z23 Encounter for immunization: Secondary | ICD-10-CM | POA: Diagnosis not present

## 2019-09-30 DIAGNOSIS — R112 Nausea with vomiting, unspecified: Secondary | ICD-10-CM | POA: Diagnosis not present

## 2019-09-30 DIAGNOSIS — N2581 Secondary hyperparathyroidism of renal origin: Secondary | ICD-10-CM | POA: Diagnosis not present

## 2019-09-30 DIAGNOSIS — Z992 Dependence on renal dialysis: Secondary | ICD-10-CM | POA: Diagnosis not present

## 2019-09-30 DIAGNOSIS — D509 Iron deficiency anemia, unspecified: Secondary | ICD-10-CM | POA: Diagnosis not present

## 2019-09-30 DIAGNOSIS — N186 End stage renal disease: Secondary | ICD-10-CM | POA: Diagnosis not present

## 2019-10-02 DIAGNOSIS — D509 Iron deficiency anemia, unspecified: Secondary | ICD-10-CM | POA: Diagnosis not present

## 2019-10-02 DIAGNOSIS — N2581 Secondary hyperparathyroidism of renal origin: Secondary | ICD-10-CM | POA: Diagnosis not present

## 2019-10-02 DIAGNOSIS — Z23 Encounter for immunization: Secondary | ICD-10-CM | POA: Diagnosis not present

## 2019-10-02 DIAGNOSIS — N186 End stage renal disease: Secondary | ICD-10-CM | POA: Diagnosis not present

## 2019-10-02 DIAGNOSIS — Z992 Dependence on renal dialysis: Secondary | ICD-10-CM | POA: Diagnosis not present

## 2019-10-02 DIAGNOSIS — R112 Nausea with vomiting, unspecified: Secondary | ICD-10-CM | POA: Diagnosis not present

## 2019-10-05 DIAGNOSIS — Z992 Dependence on renal dialysis: Secondary | ICD-10-CM | POA: Diagnosis not present

## 2019-10-05 DIAGNOSIS — N2581 Secondary hyperparathyroidism of renal origin: Secondary | ICD-10-CM | POA: Diagnosis not present

## 2019-10-05 DIAGNOSIS — Z23 Encounter for immunization: Secondary | ICD-10-CM | POA: Diagnosis not present

## 2019-10-05 DIAGNOSIS — D509 Iron deficiency anemia, unspecified: Secondary | ICD-10-CM | POA: Diagnosis not present

## 2019-10-05 DIAGNOSIS — N186 End stage renal disease: Secondary | ICD-10-CM | POA: Diagnosis not present

## 2019-10-05 DIAGNOSIS — R112 Nausea with vomiting, unspecified: Secondary | ICD-10-CM | POA: Diagnosis not present
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
## 2019-10-05 IMAGING — CR DG CHEST 2V
2 series · 2 of 2 positions shown · non-contrast
Comparison: Chest x-ray dated 02/24/2018.

CLINICAL DATA: Preop for RIGHT ankle surgery. History of
hypertension, diabetes, dialysis, breast cancer status post
mastectomy last year.

EXAM:
CHEST - 2 VIEW

[w chest lat]
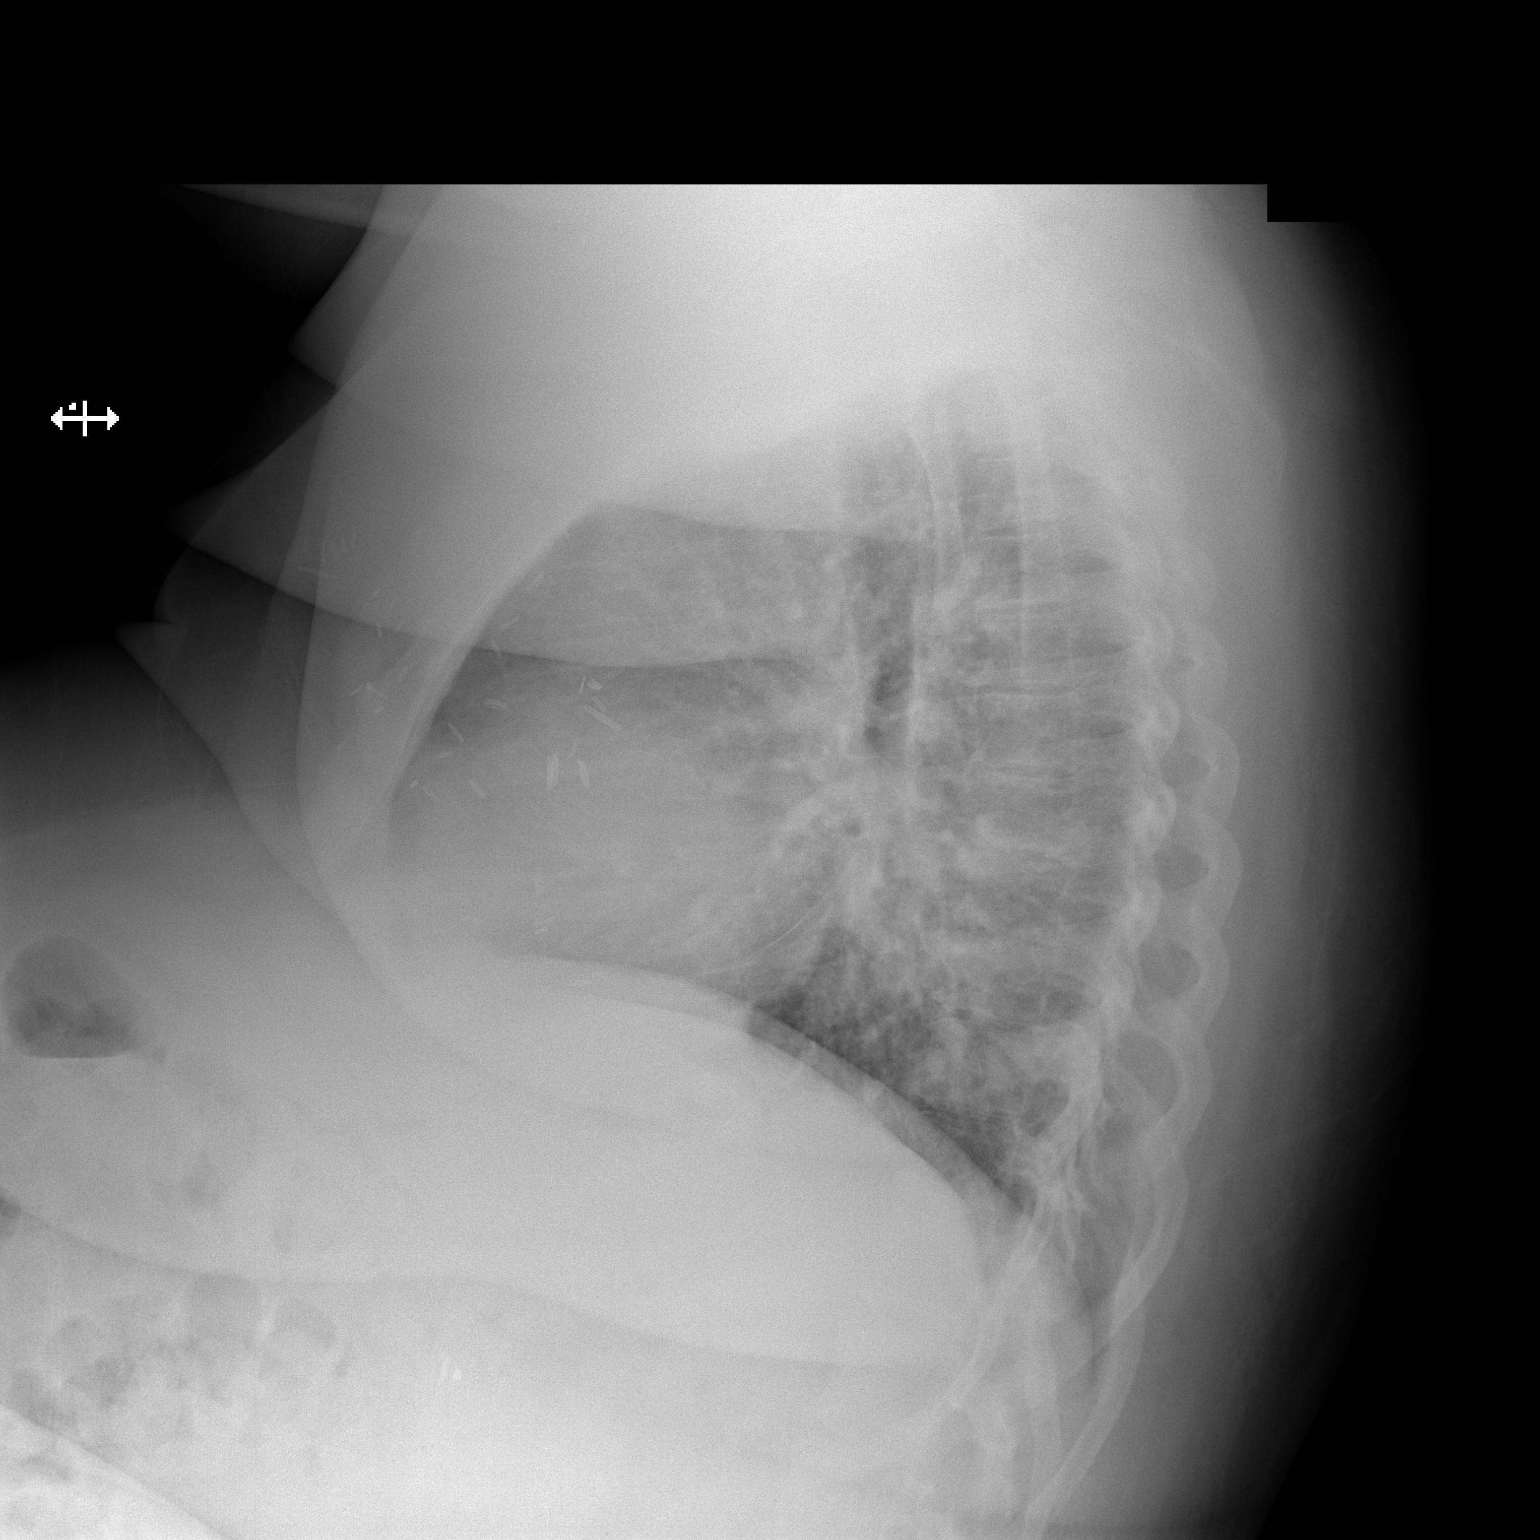

[x chest ap]
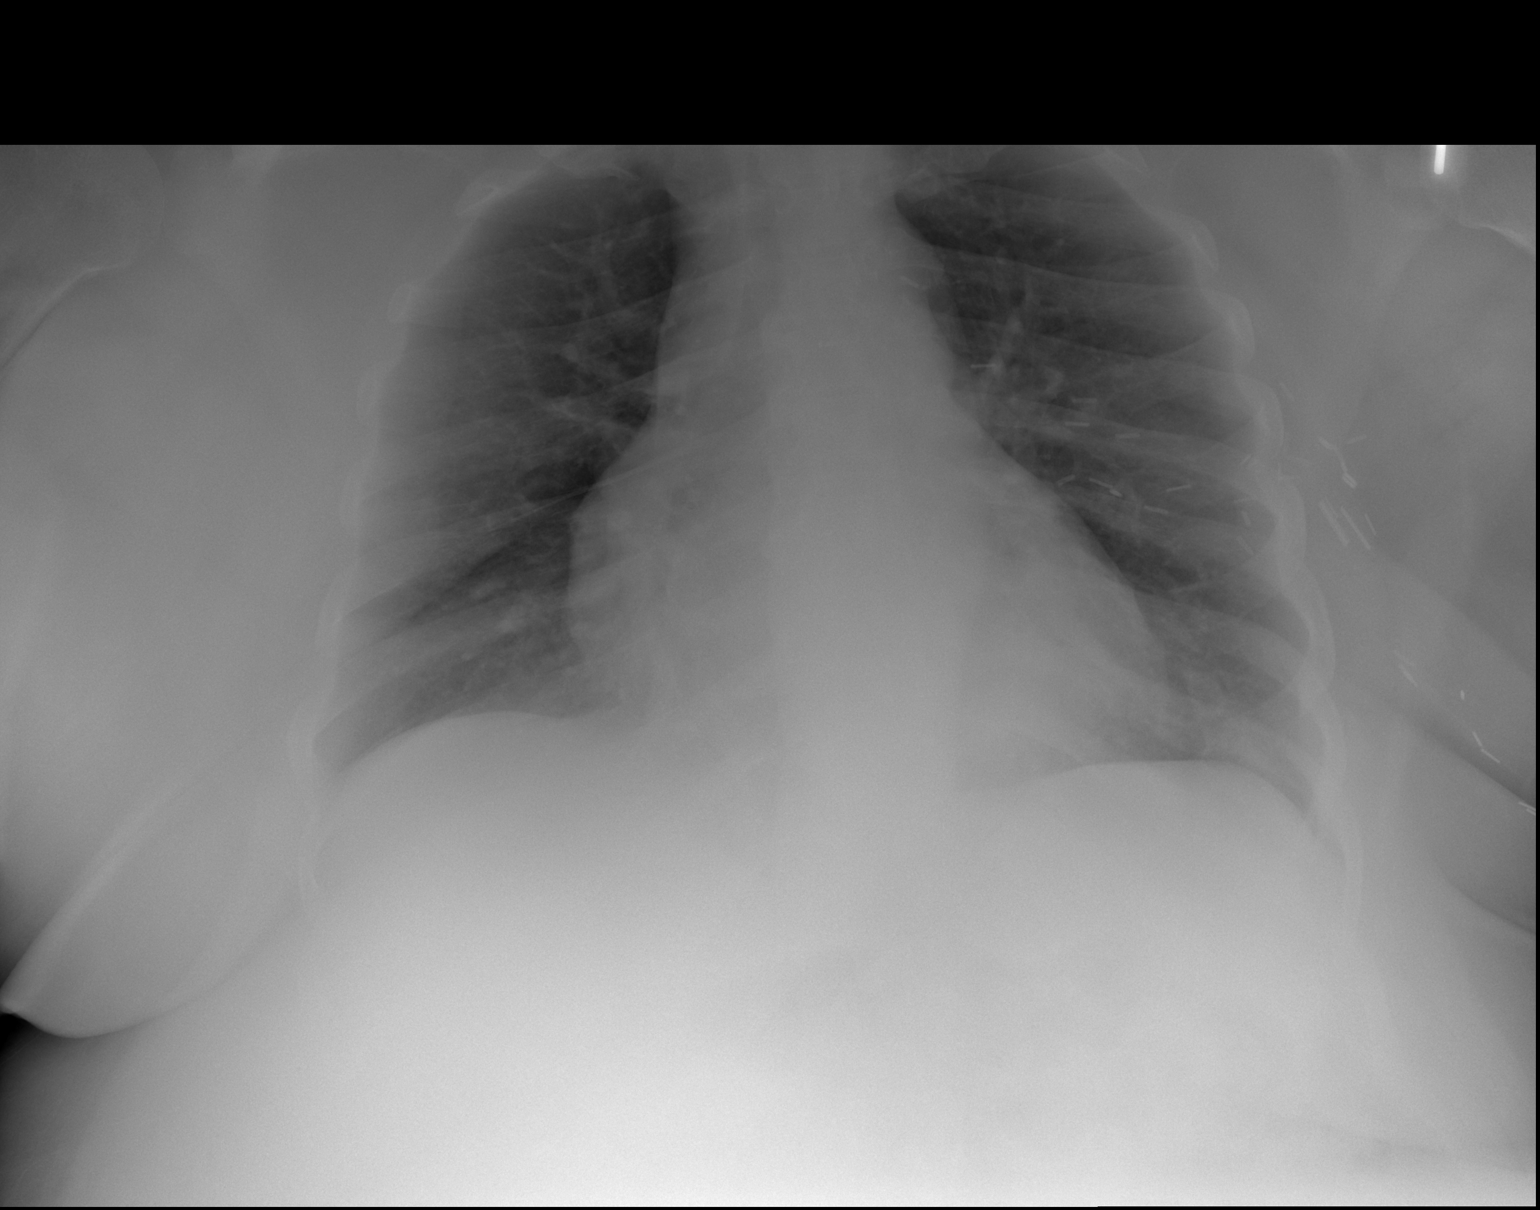

[2 of 2 positions shown; findings below may reference images not displayed]

FINDINGS: Stable mild cardiomegaly. Lungs are clear. No pleural effusion.
Surgical clips over the LEFT chest wall, compatible with the given
history of mastectomy. No acute or suspicious osseous finding.
IMPRESSION: No active cardiopulmonary disease. No evidence of pneumonia or
pulmonary edema.

## 2019-10-06 ENCOUNTER — Other Ambulatory Visit: Payer: Self-pay | Admitting: Nurse Practitioner

## 2019-10-06 DIAGNOSIS — R1084 Generalized abdominal pain: Secondary | ICD-10-CM

## 2019-10-07 DIAGNOSIS — N2581 Secondary hyperparathyroidism of renal origin: Secondary | ICD-10-CM | POA: Diagnosis not present

## 2019-10-07 DIAGNOSIS — N186 End stage renal disease: Secondary | ICD-10-CM | POA: Diagnosis not present

## 2019-10-07 DIAGNOSIS — D509 Iron deficiency anemia, unspecified: Secondary | ICD-10-CM | POA: Diagnosis not present

## 2019-10-07 DIAGNOSIS — R112 Nausea with vomiting, unspecified: Secondary | ICD-10-CM | POA: Diagnosis not present

## 2019-10-07 DIAGNOSIS — Z992 Dependence on renal dialysis: Secondary | ICD-10-CM | POA: Diagnosis not present

## 2019-10-07 DIAGNOSIS — Z23 Encounter for immunization: Secondary | ICD-10-CM | POA: Diagnosis not present

## 2019-10-09 DIAGNOSIS — N186 End stage renal disease: Secondary | ICD-10-CM | POA: Diagnosis not present

## 2019-10-09 DIAGNOSIS — Z23 Encounter for immunization: Secondary | ICD-10-CM | POA: Diagnosis not present

## 2019-10-09 DIAGNOSIS — Z992 Dependence on renal dialysis: Secondary | ICD-10-CM | POA: Diagnosis not present

## 2019-10-09 DIAGNOSIS — D509 Iron deficiency anemia, unspecified: Secondary | ICD-10-CM | POA: Diagnosis not present

## 2019-10-09 DIAGNOSIS — R112 Nausea with vomiting, unspecified: Secondary | ICD-10-CM | POA: Diagnosis not present

## 2019-10-09 DIAGNOSIS — N2581 Secondary hyperparathyroidism of renal origin: Secondary | ICD-10-CM | POA: Diagnosis not present

## 2019-10-12 DIAGNOSIS — D509 Iron deficiency anemia, unspecified: Secondary | ICD-10-CM | POA: Diagnosis not present

## 2019-10-12 DIAGNOSIS — Z992 Dependence on renal dialysis: Secondary | ICD-10-CM | POA: Diagnosis not present

## 2019-10-12 DIAGNOSIS — R112 Nausea with vomiting, unspecified: Secondary | ICD-10-CM | POA: Diagnosis not present

## 2019-10-12 DIAGNOSIS — N2581 Secondary hyperparathyroidism of renal origin: Secondary | ICD-10-CM | POA: Diagnosis not present

## 2019-10-12 DIAGNOSIS — N186 End stage renal disease: Secondary | ICD-10-CM | POA: Diagnosis not present

## 2019-10-12 DIAGNOSIS — Z23 Encounter for immunization: Secondary | ICD-10-CM | POA: Diagnosis not present

## 2019-10-14 DIAGNOSIS — N2581 Secondary hyperparathyroidism of renal origin: Secondary | ICD-10-CM | POA: Diagnosis not present

## 2019-10-14 DIAGNOSIS — H35033 Hypertensive retinopathy, bilateral: Secondary | ICD-10-CM | POA: Diagnosis not present

## 2019-10-14 DIAGNOSIS — R112 Nausea with vomiting, unspecified: Secondary | ICD-10-CM | POA: Diagnosis not present

## 2019-10-14 DIAGNOSIS — Z23 Encounter for immunization: Secondary | ICD-10-CM | POA: Diagnosis not present

## 2019-10-14 DIAGNOSIS — E113513 Type 2 diabetes mellitus with proliferative diabetic retinopathy with macular edema, bilateral: Secondary | ICD-10-CM | POA: Diagnosis not present

## 2019-10-14 DIAGNOSIS — E11311 Type 2 diabetes mellitus with unspecified diabetic retinopathy with macular edema: Secondary | ICD-10-CM | POA: Diagnosis not present

## 2019-10-14 DIAGNOSIS — D509 Iron deficiency anemia, unspecified: Secondary | ICD-10-CM | POA: Diagnosis not present

## 2019-10-14 DIAGNOSIS — Z992 Dependence on renal dialysis: Secondary | ICD-10-CM | POA: Diagnosis not present

## 2019-10-14 DIAGNOSIS — N186 End stage renal disease: Secondary | ICD-10-CM | POA: Diagnosis not present

## 2019-10-16 DIAGNOSIS — Z992 Dependence on renal dialysis: Secondary | ICD-10-CM | POA: Diagnosis not present

## 2019-10-16 DIAGNOSIS — N2581 Secondary hyperparathyroidism of renal origin: Secondary | ICD-10-CM | POA: Diagnosis not present

## 2019-10-16 DIAGNOSIS — Z23 Encounter for immunization: Secondary | ICD-10-CM | POA: Diagnosis not present

## 2019-10-16 DIAGNOSIS — N186 End stage renal disease: Secondary | ICD-10-CM | POA: Diagnosis not present

## 2019-10-16 DIAGNOSIS — R112 Nausea with vomiting, unspecified: Secondary | ICD-10-CM | POA: Diagnosis not present

## 2019-10-16 DIAGNOSIS — D509 Iron deficiency anemia, unspecified: Secondary | ICD-10-CM | POA: Diagnosis not present

## 2019-10-17 DIAGNOSIS — N186 End stage renal disease: Secondary | ICD-10-CM | POA: Diagnosis not present

## 2019-10-17 DIAGNOSIS — Z992 Dependence on renal dialysis: Secondary | ICD-10-CM | POA: Diagnosis not present

## 2019-10-19 DIAGNOSIS — D509 Iron deficiency anemia, unspecified: Secondary | ICD-10-CM | POA: Diagnosis not present

## 2019-10-19 DIAGNOSIS — D631 Anemia in chronic kidney disease: Secondary | ICD-10-CM | POA: Diagnosis not present

## 2019-10-19 DIAGNOSIS — Z992 Dependence on renal dialysis: Secondary | ICD-10-CM | POA: Diagnosis not present

## 2019-10-19 DIAGNOSIS — N186 End stage renal disease: Secondary | ICD-10-CM | POA: Diagnosis not present

## 2019-10-19 DIAGNOSIS — N2581 Secondary hyperparathyroidism of renal origin: Secondary | ICD-10-CM | POA: Diagnosis not present

## 2019-10-21 DIAGNOSIS — D509 Iron deficiency anemia, unspecified: Secondary | ICD-10-CM | POA: Diagnosis not present

## 2019-10-21 DIAGNOSIS — N186 End stage renal disease: Secondary | ICD-10-CM | POA: Diagnosis not present

## 2019-10-21 DIAGNOSIS — D631 Anemia in chronic kidney disease: Secondary | ICD-10-CM | POA: Diagnosis not present

## 2019-10-21 DIAGNOSIS — N2581 Secondary hyperparathyroidism of renal origin: Secondary | ICD-10-CM | POA: Diagnosis not present

## 2019-10-21 DIAGNOSIS — Z992 Dependence on renal dialysis: Secondary | ICD-10-CM | POA: Diagnosis not present

## 2019-10-23 DIAGNOSIS — D509 Iron deficiency anemia, unspecified: Secondary | ICD-10-CM | POA: Diagnosis not present

## 2019-10-23 DIAGNOSIS — N186 End stage renal disease: Secondary | ICD-10-CM | POA: Diagnosis not present

## 2019-10-23 DIAGNOSIS — Z992 Dependence on renal dialysis: Secondary | ICD-10-CM | POA: Diagnosis not present

## 2019-10-23 DIAGNOSIS — N2581 Secondary hyperparathyroidism of renal origin: Secondary | ICD-10-CM | POA: Diagnosis not present

## 2019-10-23 DIAGNOSIS — D631 Anemia in chronic kidney disease: Secondary | ICD-10-CM | POA: Diagnosis not present

## 2019-10-26 DIAGNOSIS — Z992 Dependence on renal dialysis: Secondary | ICD-10-CM | POA: Diagnosis not present

## 2019-10-26 DIAGNOSIS — N2581 Secondary hyperparathyroidism of renal origin: Secondary | ICD-10-CM | POA: Diagnosis not present

## 2019-10-26 DIAGNOSIS — N186 End stage renal disease: Secondary | ICD-10-CM | POA: Diagnosis not present

## 2019-10-26 DIAGNOSIS — D631 Anemia in chronic kidney disease: Secondary | ICD-10-CM | POA: Diagnosis not present

## 2019-10-26 DIAGNOSIS — D509 Iron deficiency anemia, unspecified: Secondary | ICD-10-CM | POA: Diagnosis not present

## 2019-10-28 DIAGNOSIS — N186 End stage renal disease: Secondary | ICD-10-CM | POA: Diagnosis not present

## 2019-10-28 DIAGNOSIS — D631 Anemia in chronic kidney disease: Secondary | ICD-10-CM | POA: Diagnosis not present

## 2019-10-28 DIAGNOSIS — Z992 Dependence on renal dialysis: Secondary | ICD-10-CM | POA: Diagnosis not present

## 2019-10-28 DIAGNOSIS — N2581 Secondary hyperparathyroidism of renal origin: Secondary | ICD-10-CM | POA: Diagnosis not present

## 2019-10-28 DIAGNOSIS — D509 Iron deficiency anemia, unspecified: Secondary | ICD-10-CM | POA: Diagnosis not present

## 2019-10-30 ENCOUNTER — Other Ambulatory Visit: Payer: Self-pay | Admitting: Nurse Practitioner

## 2019-10-30 DIAGNOSIS — D509 Iron deficiency anemia, unspecified: Secondary | ICD-10-CM | POA: Diagnosis not present

## 2019-10-30 DIAGNOSIS — K5909 Other constipation: Secondary | ICD-10-CM

## 2019-10-30 DIAGNOSIS — D631 Anemia in chronic kidney disease: Secondary | ICD-10-CM | POA: Diagnosis not present

## 2019-10-30 DIAGNOSIS — N186 End stage renal disease: Secondary | ICD-10-CM | POA: Diagnosis not present

## 2019-10-30 DIAGNOSIS — N2581 Secondary hyperparathyroidism of renal origin: Secondary | ICD-10-CM | POA: Diagnosis not present

## 2019-10-30 DIAGNOSIS — Z992 Dependence on renal dialysis: Secondary | ICD-10-CM | POA: Diagnosis not present

## 2019-11-02 DIAGNOSIS — D509 Iron deficiency anemia, unspecified: Secondary | ICD-10-CM | POA: Diagnosis not present

## 2019-11-02 DIAGNOSIS — Z992 Dependence on renal dialysis: Secondary | ICD-10-CM | POA: Diagnosis not present

## 2019-11-02 DIAGNOSIS — D631 Anemia in chronic kidney disease: Secondary | ICD-10-CM | POA: Diagnosis not present

## 2019-11-02 DIAGNOSIS — N186 End stage renal disease: Secondary | ICD-10-CM | POA: Diagnosis not present

## 2019-11-02 DIAGNOSIS — N2581 Secondary hyperparathyroidism of renal origin: Secondary | ICD-10-CM | POA: Diagnosis not present

## 2019-11-04 DIAGNOSIS — D631 Anemia in chronic kidney disease: Secondary | ICD-10-CM | POA: Diagnosis not present

## 2019-11-04 DIAGNOSIS — N186 End stage renal disease: Secondary | ICD-10-CM | POA: Diagnosis not present

## 2019-11-04 DIAGNOSIS — Z992 Dependence on renal dialysis: Secondary | ICD-10-CM | POA: Diagnosis not present

## 2019-11-04 DIAGNOSIS — N2581 Secondary hyperparathyroidism of renal origin: Secondary | ICD-10-CM | POA: Diagnosis not present

## 2019-11-04 DIAGNOSIS — D509 Iron deficiency anemia, unspecified: Secondary | ICD-10-CM | POA: Diagnosis not present

## 2019-11-06 DIAGNOSIS — D631 Anemia in chronic kidney disease: Secondary | ICD-10-CM | POA: Diagnosis not present

## 2019-11-06 DIAGNOSIS — N186 End stage renal disease: Secondary | ICD-10-CM | POA: Diagnosis not present

## 2019-11-06 DIAGNOSIS — N2581 Secondary hyperparathyroidism of renal origin: Secondary | ICD-10-CM | POA: Diagnosis not present

## 2019-11-06 DIAGNOSIS — D509 Iron deficiency anemia, unspecified: Secondary | ICD-10-CM | POA: Diagnosis not present

## 2019-11-06 DIAGNOSIS — Z992 Dependence on renal dialysis: Secondary | ICD-10-CM | POA: Diagnosis not present

## 2019-11-09 DIAGNOSIS — N2581 Secondary hyperparathyroidism of renal origin: Secondary | ICD-10-CM | POA: Diagnosis not present

## 2019-11-09 DIAGNOSIS — Z992 Dependence on renal dialysis: Secondary | ICD-10-CM | POA: Diagnosis not present

## 2019-11-09 DIAGNOSIS — N186 End stage renal disease: Secondary | ICD-10-CM | POA: Diagnosis not present

## 2019-11-09 DIAGNOSIS — D509 Iron deficiency anemia, unspecified: Secondary | ICD-10-CM | POA: Diagnosis not present

## 2019-11-09 DIAGNOSIS — D631 Anemia in chronic kidney disease: Secondary | ICD-10-CM | POA: Diagnosis not present

## 2019-11-11 DIAGNOSIS — N2581 Secondary hyperparathyroidism of renal origin: Secondary | ICD-10-CM | POA: Diagnosis not present

## 2019-11-11 DIAGNOSIS — Z992 Dependence on renal dialysis: Secondary | ICD-10-CM | POA: Diagnosis not present

## 2019-11-11 DIAGNOSIS — D509 Iron deficiency anemia, unspecified: Secondary | ICD-10-CM | POA: Diagnosis not present

## 2019-11-11 DIAGNOSIS — N186 End stage renal disease: Secondary | ICD-10-CM | POA: Diagnosis not present

## 2019-11-11 DIAGNOSIS — D631 Anemia in chronic kidney disease: Secondary | ICD-10-CM | POA: Diagnosis not present

## 2019-11-14 DIAGNOSIS — Z992 Dependence on renal dialysis: Secondary | ICD-10-CM | POA: Diagnosis not present

## 2019-11-14 DIAGNOSIS — D509 Iron deficiency anemia, unspecified: Secondary | ICD-10-CM | POA: Diagnosis not present

## 2019-11-14 DIAGNOSIS — N186 End stage renal disease: Secondary | ICD-10-CM | POA: Diagnosis not present

## 2019-11-14 DIAGNOSIS — N2581 Secondary hyperparathyroidism of renal origin: Secondary | ICD-10-CM | POA: Diagnosis not present

## 2019-11-14 DIAGNOSIS — D631 Anemia in chronic kidney disease: Secondary | ICD-10-CM | POA: Diagnosis not present

## 2019-11-16 DIAGNOSIS — N186 End stage renal disease: Secondary | ICD-10-CM | POA: Diagnosis not present

## 2019-11-16 DIAGNOSIS — D631 Anemia in chronic kidney disease: Secondary | ICD-10-CM | POA: Diagnosis not present

## 2019-11-16 DIAGNOSIS — Z992 Dependence on renal dialysis: Secondary | ICD-10-CM | POA: Diagnosis not present

## 2019-11-16 DIAGNOSIS — D509 Iron deficiency anemia, unspecified: Secondary | ICD-10-CM | POA: Diagnosis not present

## 2019-11-16 DIAGNOSIS — N2581 Secondary hyperparathyroidism of renal origin: Secondary | ICD-10-CM | POA: Diagnosis not present

## 2019-11-17 ENCOUNTER — Ambulatory Visit (INDEPENDENT_AMBULATORY_CARE_PROVIDER_SITE_OTHER): Payer: Medicare Other | Admitting: Nurse Practitioner

## 2019-11-17 ENCOUNTER — Encounter: Payer: Self-pay | Admitting: Nurse Practitioner

## 2019-11-17 ENCOUNTER — Other Ambulatory Visit: Payer: Self-pay

## 2019-11-17 VITALS — BP 174/88 | HR 104 | Temp 96.2°F | Ht 63.0 in | Wt 250.6 lb

## 2019-11-17 DIAGNOSIS — K5909 Other constipation: Secondary | ICD-10-CM | POA: Diagnosis not present

## 2019-11-17 DIAGNOSIS — R112 Nausea with vomiting, unspecified: Secondary | ICD-10-CM | POA: Diagnosis not present

## 2019-11-17 DIAGNOSIS — R1084 Generalized abdominal pain: Secondary | ICD-10-CM

## 2019-11-17 NOTE — Assessment & Plan Note (Signed)
Constipation somewhat improved but not optimally controlled on Trulance daily.  She does have a bowel movement once a day but has incomplete emptying.  Only takes MiraLAX as needed.  At this point I recommend she continue Trulance as it seems to work as well as any option covered by her insurance.  Start taking MiraLAX once a day, every day.  She can take this twice a day as needed.  Call us for any worsening or no improvement in constipation.  Follow-up in 3 months.

## 2019-11-17 NOTE — Patient Instructions (Signed)
Your health issues we discussed today were:   Constipation: 1. Continue taking Trulance every day 2. Start taking MiraLAX once a day, every day. 3. You can increase MiraLAX to twice a day as needed for better, more complete bowel movements 4. Call us if you have any worsening constipation or no improvement with these changes  Nausea and vomiting: 1. I am glad you are not frequently having these issues 2. As we discussed, as soon as you start to feel some nausea "in your throat" take a Zofran to see if this can prevent your vomiting 3. Call us if you have any worsening or severe vomiting  Abdominal pain: 1. I am glad your abdominal pain is better.  It was likely due to your significant constipation, which is doing somewhat better on Trulance 2. See recommendations for constipation above 3. Call us if you have any worsening or severe symptoms  Overall I recommend:  1. Continue your other current medications 2. Return for follow-up in 3 months 3. Call us if you have any questions or concerns.   Because of recent events of COVID-19 ("Coronavirus"), follow CDC recommendations:  1. Wash your hand frequently 2. Avoid touching your face 3. Stay away from people who are sick 4. If you have symptoms such as fever, cough, shortness of breath then call your healthcare provider for further guidance 5. If you are sick, STAY AT HOME unless otherwise directed by your healthcare provider. 6. Follow directions from state and national officials regarding staying safe   At [facility] we value your feedback. You may receive a survey about your visit today. Please share your experience as we strive to create trusting relationships with our patients to provide genuine, compassionate, quality care.  We appreciate your understanding and patience as we review any laboratory studies, imaging, and other diagnostic tests that are ordered as we care for you. Our office policy is 5 business  days for review of these results, and any emergent or urgent results are addressed in a timely manner for your best interest. If you do not hear from our office in 1 week, please contact us.   We also encourage the use of MyChart, which contains your medical information for your review as well. If you are not enrolled in this feature, an access code is on this after visit summary for your convenience. Thank you for allowing [place] to be involved in your care.  It was great to see you today!  I hope you have a Merry Christmas and Happy Holidays!!
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
## 2019-11-17 NOTE — Assessment & Plan Note (Signed)
Abdominal pain significantly improved, likely due to improvement in constipation.  Further constipation measures as per below.  Continue to monitor and notify us of any worsening or severe abdominal pain.  Follow-up in 3 months.

## 2019-11-17 NOTE — Assessment & Plan Note (Signed)
Some persistent, albeit rare nausea and vomiting.  Likely related to constipation versus hemodialysis and complex medication regimen.  Zofran works quite effectively for her.  She does have prevomiting episodes of nausea when she is going to have issues.  I recommended she take Zofran as soon as she starts to feel like she is getting some mild nausea but had all vomiting.  Call us for any worsening or severe symptoms.  Follow-up in 3 months.

## 2019-11-25 DIAGNOSIS — Z992 Dependence on renal dialysis: Secondary | ICD-10-CM | POA: Diagnosis not present

## 2019-12-13 DIAGNOSIS — D649 Anemia, unspecified: Secondary | ICD-10-CM | POA: Diagnosis not present

## 2019-12-13 DIAGNOSIS — N186 End stage renal disease: Secondary | ICD-10-CM | POA: Diagnosis not present

## 2019-12-13 DIAGNOSIS — I10 Essential (primary) hypertension: Secondary | ICD-10-CM | POA: Diagnosis not present

## 2019-12-13 DIAGNOSIS — J9601 Acute respiratory failure with hypoxia: Secondary | ICD-10-CM | POA: Diagnosis not present

## 2019-12-13 DIAGNOSIS — R778 Other specified abnormalities of plasma proteins: Secondary | ICD-10-CM | POA: Diagnosis not present

## 2019-12-13 DIAGNOSIS — E119 Type 2 diabetes mellitus without complications: Secondary | ICD-10-CM | POA: Diagnosis not present

## 2019-12-13 DIAGNOSIS — A419 Sepsis, unspecified organism: Secondary | ICD-10-CM | POA: Diagnosis not present

## 2019-12-13 DIAGNOSIS — J189 Pneumonia, unspecified organism: Secondary | ICD-10-CM | POA: Diagnosis not present

## 2019-12-13 DIAGNOSIS — U071 COVID-19: Secondary | ICD-10-CM | POA: Diagnosis not present

## 2019-12-14 DIAGNOSIS — D649 Anemia, unspecified: Secondary | ICD-10-CM | POA: Diagnosis not present

## 2019-12-14 DIAGNOSIS — N186 End stage renal disease: Secondary | ICD-10-CM | POA: Diagnosis not present

## 2019-12-14 DIAGNOSIS — R778 Other specified abnormalities of plasma proteins: Secondary | ICD-10-CM | POA: Diagnosis not present

## 2019-12-14 DIAGNOSIS — I10 Essential (primary) hypertension: Secondary | ICD-10-CM | POA: Diagnosis not present

## 2019-12-14 DIAGNOSIS — E119 Type 2 diabetes mellitus without complications: Secondary | ICD-10-CM | POA: Diagnosis not present

## 2019-12-14 DIAGNOSIS — J189 Pneumonia, unspecified organism: Secondary | ICD-10-CM | POA: Diagnosis not present

## 2019-12-14 DIAGNOSIS — J9601 Acute respiratory failure with hypoxia: Secondary | ICD-10-CM | POA: Diagnosis not present

## 2019-12-14 DIAGNOSIS — U071 COVID-19: Secondary | ICD-10-CM | POA: Diagnosis not present

## 2019-12-14 DIAGNOSIS — A419 Sepsis, unspecified organism: Secondary | ICD-10-CM | POA: Diagnosis not present

## 2019-12-15 DIAGNOSIS — E119 Type 2 diabetes mellitus without complications: Secondary | ICD-10-CM | POA: Diagnosis not present

## 2019-12-15 DIAGNOSIS — J189 Pneumonia, unspecified organism: Secondary | ICD-10-CM | POA: Diagnosis not present

## 2019-12-15 DIAGNOSIS — D649 Anemia, unspecified: Secondary | ICD-10-CM | POA: Diagnosis not present

## 2019-12-15 DIAGNOSIS — I10 Essential (primary) hypertension: Secondary | ICD-10-CM | POA: Diagnosis not present

## 2019-12-15 DIAGNOSIS — J9601 Acute respiratory failure with hypoxia: Secondary | ICD-10-CM | POA: Diagnosis not present

## 2019-12-15 DIAGNOSIS — R778 Other specified abnormalities of plasma proteins: Secondary | ICD-10-CM | POA: Diagnosis not present

## 2019-12-15 DIAGNOSIS — A419 Sepsis, unspecified organism: Secondary | ICD-10-CM | POA: Diagnosis not present

## 2019-12-15 DIAGNOSIS — U071 COVID-19: Secondary | ICD-10-CM | POA: Diagnosis not present

## 2019-12-15 DIAGNOSIS — N186 End stage renal disease: Secondary | ICD-10-CM | POA: Diagnosis not present

## 2019-12-16 DIAGNOSIS — R778 Other specified abnormalities of plasma proteins: Secondary | ICD-10-CM | POA: Diagnosis not present

## 2019-12-16 DIAGNOSIS — D649 Anemia, unspecified: Secondary | ICD-10-CM | POA: Diagnosis not present

## 2019-12-16 DIAGNOSIS — U071 COVID-19: Secondary | ICD-10-CM | POA: Diagnosis not present

## 2019-12-16 DIAGNOSIS — J189 Pneumonia, unspecified organism: Secondary | ICD-10-CM | POA: Diagnosis not present

## 2019-12-16 DIAGNOSIS — N186 End stage renal disease: Secondary | ICD-10-CM | POA: Diagnosis not present

## 2019-12-16 DIAGNOSIS — J9601 Acute respiratory failure with hypoxia: Secondary | ICD-10-CM | POA: Diagnosis not present

## 2019-12-16 DIAGNOSIS — A419 Sepsis, unspecified organism: Secondary | ICD-10-CM | POA: Diagnosis not present

## 2019-12-16 DIAGNOSIS — E119 Type 2 diabetes mellitus without complications: Secondary | ICD-10-CM | POA: Diagnosis not present

## 2019-12-16 DIAGNOSIS — I10 Essential (primary) hypertension: Secondary | ICD-10-CM | POA: Diagnosis not present

## 2019-12-17 DIAGNOSIS — D649 Anemia, unspecified: Secondary | ICD-10-CM | POA: Diagnosis not present

## 2019-12-17 DIAGNOSIS — U071 COVID-19: Secondary | ICD-10-CM | POA: Diagnosis not present

## 2019-12-17 DIAGNOSIS — I10 Essential (primary) hypertension: Secondary | ICD-10-CM | POA: Diagnosis not present

## 2019-12-17 DIAGNOSIS — J9601 Acute respiratory failure with hypoxia: Secondary | ICD-10-CM | POA: Diagnosis not present

## 2019-12-17 DIAGNOSIS — N186 End stage renal disease: Secondary | ICD-10-CM | POA: Diagnosis not present

## 2019-12-17 DIAGNOSIS — E119 Type 2 diabetes mellitus without complications: Secondary | ICD-10-CM | POA: Diagnosis not present

## 2019-12-17 DIAGNOSIS — J189 Pneumonia, unspecified organism: Secondary | ICD-10-CM | POA: Diagnosis not present

## 2019-12-17 DIAGNOSIS — R778 Other specified abnormalities of plasma proteins: Secondary | ICD-10-CM | POA: Diagnosis not present

## 2019-12-17 DIAGNOSIS — A419 Sepsis, unspecified organism: Secondary | ICD-10-CM | POA: Diagnosis not present

## 2019-12-18 DIAGNOSIS — A419 Sepsis, unspecified organism: Secondary | ICD-10-CM | POA: Diagnosis not present

## 2019-12-18 DIAGNOSIS — J9601 Acute respiratory failure with hypoxia: Secondary | ICD-10-CM | POA: Diagnosis not present

## 2019-12-18 DIAGNOSIS — N186 End stage renal disease: Secondary | ICD-10-CM | POA: Diagnosis not present

## 2019-12-18 DIAGNOSIS — D649 Anemia, unspecified: Secondary | ICD-10-CM | POA: Diagnosis not present

## 2019-12-18 DIAGNOSIS — E119 Type 2 diabetes mellitus without complications: Secondary | ICD-10-CM | POA: Diagnosis not present

## 2019-12-18 DIAGNOSIS — I10 Essential (primary) hypertension: Secondary | ICD-10-CM | POA: Diagnosis not present

## 2019-12-18 DIAGNOSIS — U071 COVID-19: Secondary | ICD-10-CM | POA: Diagnosis not present

## 2019-12-18 DIAGNOSIS — J189 Pneumonia, unspecified organism: Secondary | ICD-10-CM | POA: Diagnosis not present

## 2019-12-18 DIAGNOSIS — R778 Other specified abnormalities of plasma proteins: Secondary | ICD-10-CM | POA: Diagnosis not present

## 2019-12-19 DIAGNOSIS — I10 Essential (primary) hypertension: Secondary | ICD-10-CM | POA: Diagnosis not present

## 2019-12-19 DIAGNOSIS — J9601 Acute respiratory failure with hypoxia: Secondary | ICD-10-CM | POA: Diagnosis not present

## 2019-12-19 DIAGNOSIS — N186 End stage renal disease: Secondary | ICD-10-CM | POA: Diagnosis not present

## 2019-12-19 DIAGNOSIS — E119 Type 2 diabetes mellitus without complications: Secondary | ICD-10-CM | POA: Diagnosis not present

## 2019-12-19 DIAGNOSIS — R778 Other specified abnormalities of plasma proteins: Secondary | ICD-10-CM | POA: Diagnosis not present

## 2019-12-19 DIAGNOSIS — J189 Pneumonia, unspecified organism: Secondary | ICD-10-CM | POA: Diagnosis not present

## 2019-12-19 DIAGNOSIS — U071 COVID-19: Secondary | ICD-10-CM | POA: Diagnosis not present

## 2019-12-19 DIAGNOSIS — A419 Sepsis, unspecified organism: Secondary | ICD-10-CM | POA: Diagnosis not present

## 2019-12-19 DIAGNOSIS — D649 Anemia, unspecified: Secondary | ICD-10-CM | POA: Diagnosis not present

## 2019-12-20 DIAGNOSIS — U071 COVID-19: Secondary | ICD-10-CM | POA: Diagnosis not present

## 2019-12-20 DIAGNOSIS — E119 Type 2 diabetes mellitus without complications: Secondary | ICD-10-CM | POA: Diagnosis not present

## 2019-12-20 DIAGNOSIS — D649 Anemia, unspecified: Secondary | ICD-10-CM | POA: Diagnosis not present

## 2019-12-20 DIAGNOSIS — J9601 Acute respiratory failure with hypoxia: Secondary | ICD-10-CM | POA: Diagnosis not present

## 2019-12-20 DIAGNOSIS — A419 Sepsis, unspecified organism: Secondary | ICD-10-CM | POA: Diagnosis not present

## 2019-12-20 DIAGNOSIS — N186 End stage renal disease: Secondary | ICD-10-CM | POA: Diagnosis not present

## 2019-12-20 DIAGNOSIS — J189 Pneumonia, unspecified organism: Secondary | ICD-10-CM | POA: Diagnosis not present

## 2019-12-20 DIAGNOSIS — R778 Other specified abnormalities of plasma proteins: Secondary | ICD-10-CM | POA: Diagnosis not present

## 2019-12-20 DIAGNOSIS — I10 Essential (primary) hypertension: Secondary | ICD-10-CM | POA: Diagnosis not present

## 2019-12-21 DIAGNOSIS — U071 COVID-19: Secondary | ICD-10-CM | POA: Diagnosis not present

## 2019-12-21 DIAGNOSIS — J189 Pneumonia, unspecified organism: Secondary | ICD-10-CM | POA: Diagnosis not present

## 2019-12-21 DIAGNOSIS — J9601 Acute respiratory failure with hypoxia: Secondary | ICD-10-CM | POA: Diagnosis not present

## 2019-12-21 DIAGNOSIS — N186 End stage renal disease: Secondary | ICD-10-CM | POA: Diagnosis not present

## 2019-12-21 DIAGNOSIS — E119 Type 2 diabetes mellitus without complications: Secondary | ICD-10-CM | POA: Diagnosis not present

## 2019-12-21 DIAGNOSIS — I10 Essential (primary) hypertension: Secondary | ICD-10-CM | POA: Diagnosis not present

## 2019-12-21 DIAGNOSIS — A419 Sepsis, unspecified organism: Secondary | ICD-10-CM | POA: Diagnosis not present

## 2019-12-21 DIAGNOSIS — D649 Anemia, unspecified: Secondary | ICD-10-CM | POA: Diagnosis not present

## 2019-12-21 DIAGNOSIS — R778 Other specified abnormalities of plasma proteins: Secondary | ICD-10-CM | POA: Diagnosis not present

## 2019-12-21 IMAGING — CT CT ANGIO CHEST
2 of 6 series · 19 of 46 positions shown · IV contrast (Isovue)
Comparison: CTA chest dated February 24, 2018.

CLINICAL DATA: Shortness of breath.

EXAM:
CT ANGIOGRAPHY CHEST WITH CONTRAST
TECHNIQUE: Multidetector CT imaging of the chest was performed using the
standard protocol during bolus administration of intravenous
contrast. Multiplanar CT image reconstructions and MIPs were
obtained to evaluate the vascular anatomy.
CONTRAST:  100mL WS5BVH-WLZ IOPAMIDOL (WS5BVH-WLZ) INJECTION 76%

[Series 6: thins · axial · 0.57mm/px · z∈[+1048,+1286]mm · 16 of 263 slices shown]
[im 12/263  lung]
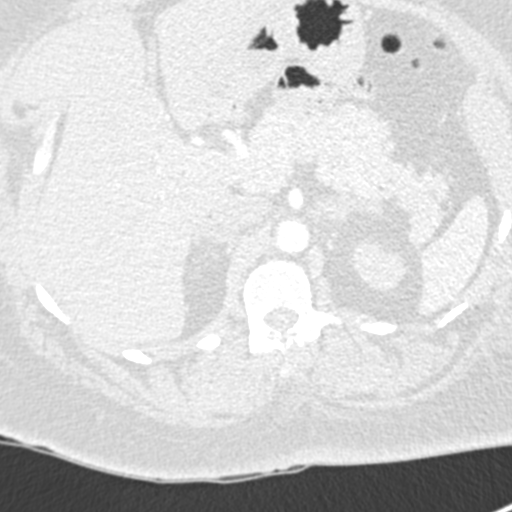
[im 35/263  soft-tissue]
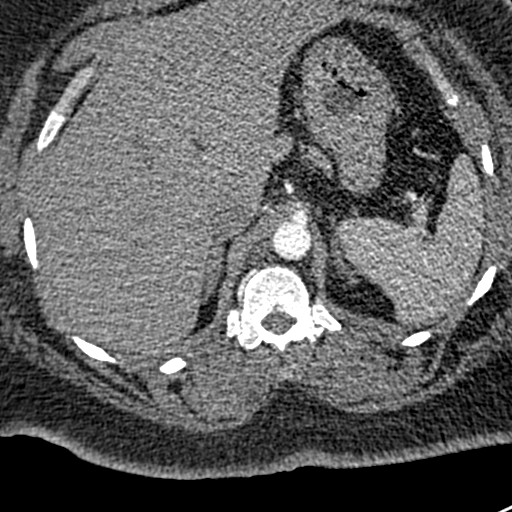
[im 46/263  lung]
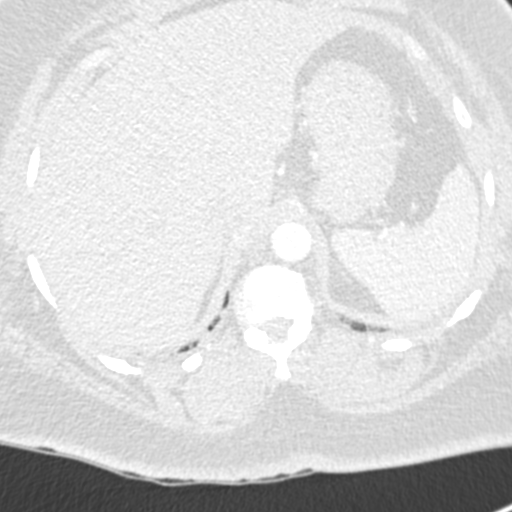
[im 57/263  soft-tissue]
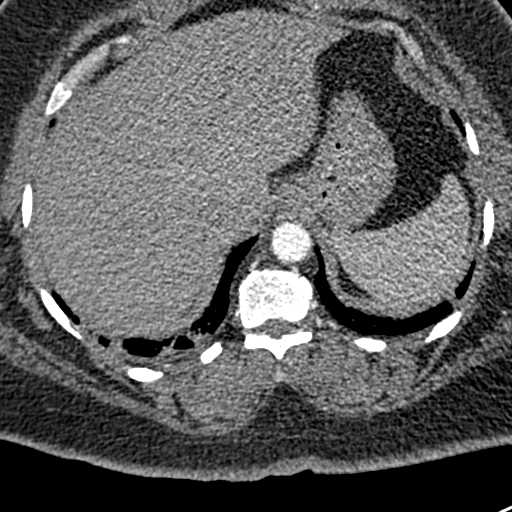
[im 80/263  lung]
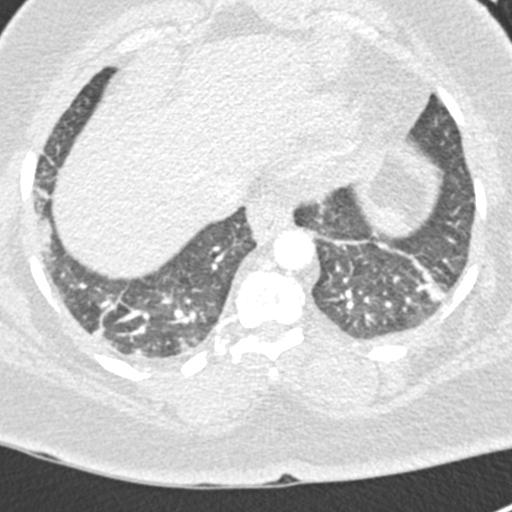
[im 92/263  soft-tissue]
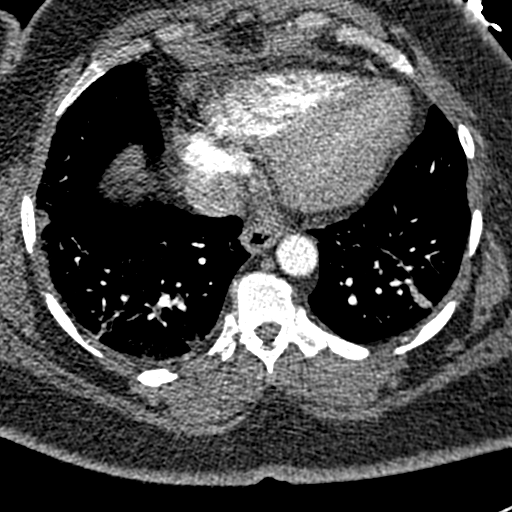
[im 103/263  lung]
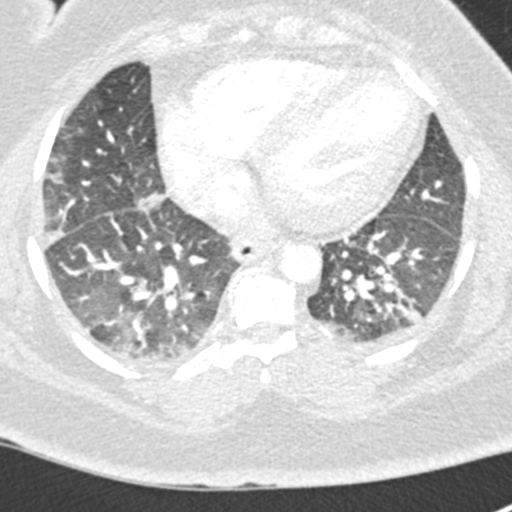
[im 126/263  soft-tissue]
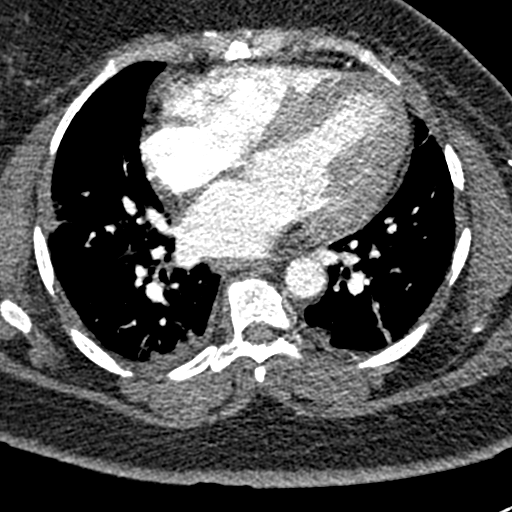
[im 137/263  lung]
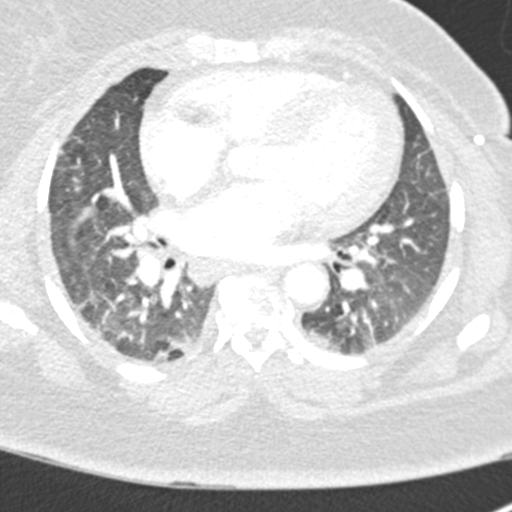
[im 160/263  soft-tissue]
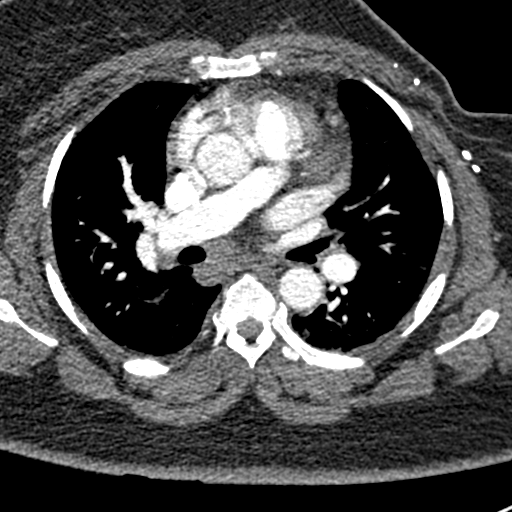
[im 171/263  lung]
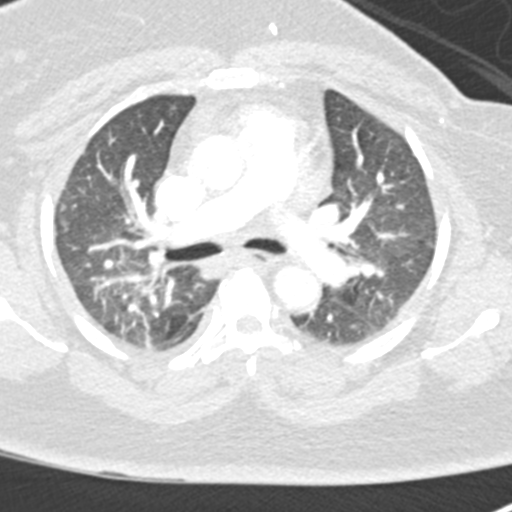
[im 183/263  soft-tissue]
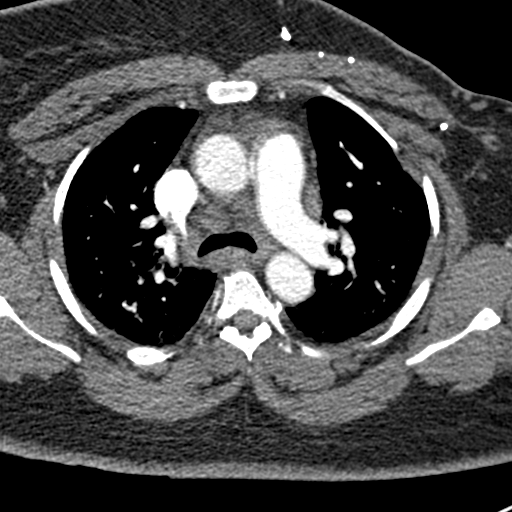
[im 206/263  lung]
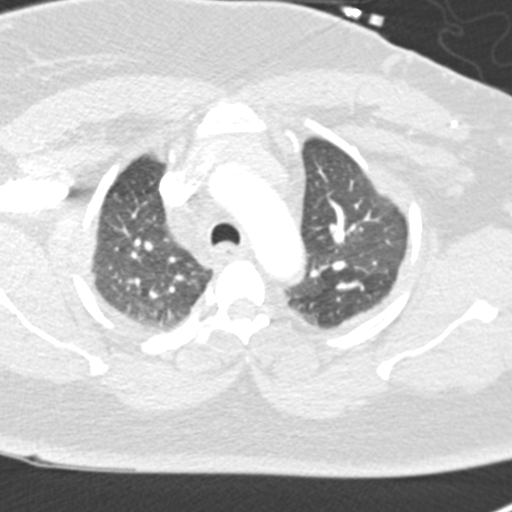
[im 217/263  soft-tissue]
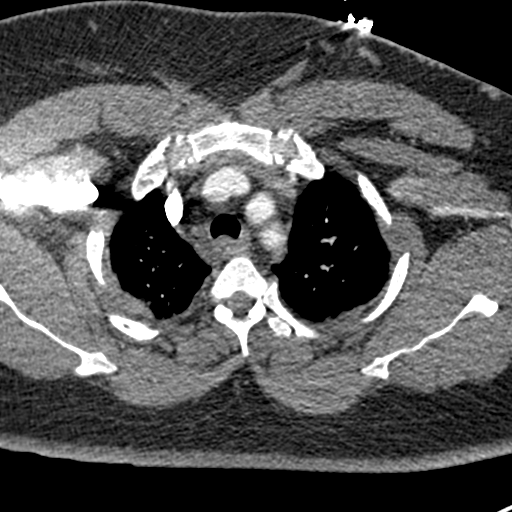
[im 228/263  lung]
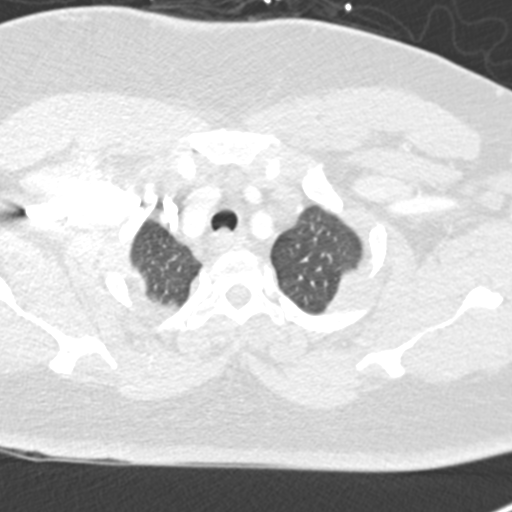
[im 251/263  soft-tissue]
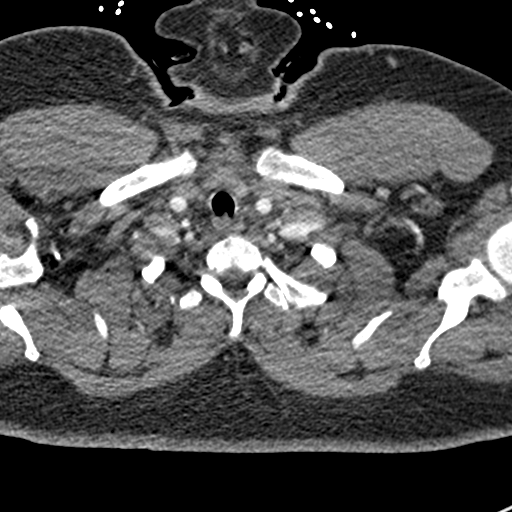

[Series 8: coronal mpr · coronal · 0.55mm/px · 3 of 151 slices shown]
[im 38/151  soft-tissue]
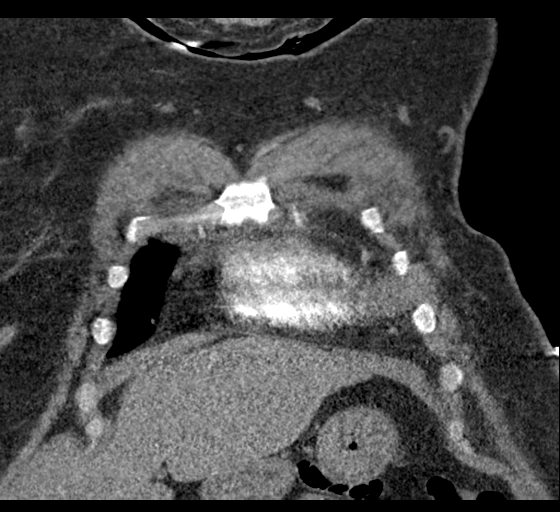
[im 76/151  soft-tissue]
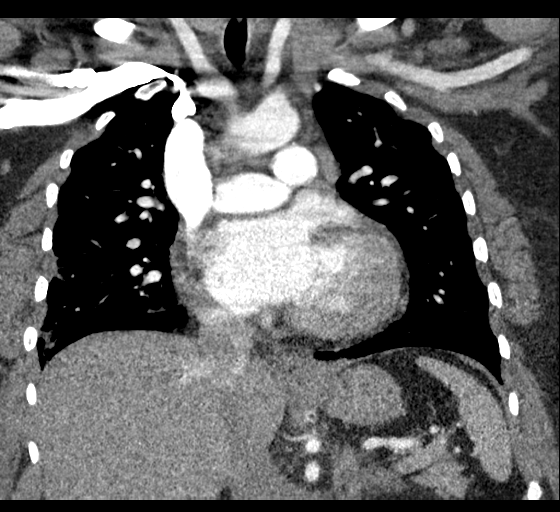
[im 113/151  soft-tissue]
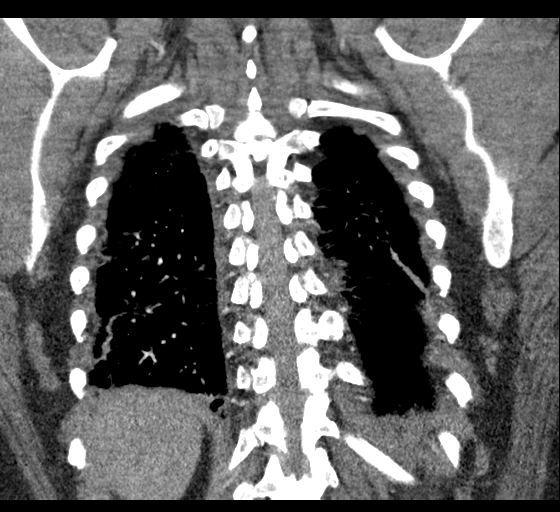

[19 of 46 positions shown; findings below may reference images not displayed]

FINDINGS: Cardiovascular: Satisfactory opacification of the pulmonary arteries
to the segmental level. No evidence of pulmonary embolism. Stable
cardiomegaly. No pericardial effusion. No thoracic aortic aneurysm
or dissection. Mild atherosclerosis of the aortic arch.

Mediastinum/Nodes: Mildly enlarged right paratracheal and subcarinal
lymphadenopathy is similar to prior studies. No axillary or hilar
lymphadenopathy. Unchanged right thyroid goiter. The trachea and
esophagus demonstrate no significant findings.

Lungs/Pleura: Hazy dependent ground-glass densities and subsegmental
atelectasis in both lungs. Trace right pleural effusion with
interlobular septal thickening at the right greater than left lung
bases. No consolidation or pneumothorax. No suspicious pulmonary
nodule.

Upper Abdomen: No acute abnormality.

Musculoskeletal: Prior left mastectomy. No chest wall abnormality.
No acute or significant osseous findings.

Review of the MIP images confirms the above findings.
IMPRESSION: 1.  No evidence of pulmonary embolism.
2. Mild interstitial and alveolar pulmonary edema in the right
greater than left lungs.
3.  Aortic atherosclerosis ([id number]).

## 2019-12-22 DIAGNOSIS — U071 COVID-19: Secondary | ICD-10-CM | POA: Diagnosis not present

## 2019-12-22 DIAGNOSIS — N186 End stage renal disease: Secondary | ICD-10-CM | POA: Diagnosis not present

## 2019-12-22 DIAGNOSIS — E119 Type 2 diabetes mellitus without complications: Secondary | ICD-10-CM | POA: Diagnosis not present

## 2019-12-22 DIAGNOSIS — I10 Essential (primary) hypertension: Secondary | ICD-10-CM | POA: Diagnosis not present

## 2019-12-22 DIAGNOSIS — A419 Sepsis, unspecified organism: Secondary | ICD-10-CM | POA: Diagnosis not present

## 2019-12-22 DIAGNOSIS — J9601 Acute respiratory failure with hypoxia: Secondary | ICD-10-CM | POA: Diagnosis not present

## 2019-12-22 DIAGNOSIS — J189 Pneumonia, unspecified organism: Secondary | ICD-10-CM | POA: Diagnosis not present

## 2019-12-22 DIAGNOSIS — D649 Anemia, unspecified: Secondary | ICD-10-CM | POA: Diagnosis not present

## 2019-12-22 DIAGNOSIS — R778 Other specified abnormalities of plasma proteins: Secondary | ICD-10-CM | POA: Diagnosis not present

## 2019-12-24 DIAGNOSIS — N2581 Secondary hyperparathyroidism of renal origin: Secondary | ICD-10-CM | POA: Diagnosis not present

## 2019-12-24 DIAGNOSIS — N186 End stage renal disease: Secondary | ICD-10-CM | POA: Diagnosis not present

## 2019-12-24 DIAGNOSIS — D631 Anemia in chronic kidney disease: Secondary | ICD-10-CM | POA: Diagnosis not present

## 2019-12-24 DIAGNOSIS — Z992 Dependence on renal dialysis: Secondary | ICD-10-CM | POA: Diagnosis not present

## 2019-12-24 DIAGNOSIS — D509 Iron deficiency anemia, unspecified: Secondary | ICD-10-CM | POA: Diagnosis not present

## 2019-12-25 DIAGNOSIS — J1282 Pneumonia due to coronavirus disease 2019: Secondary | ICD-10-CM | POA: Diagnosis not present

## 2019-12-25 DIAGNOSIS — I10 Essential (primary) hypertension: Secondary | ICD-10-CM | POA: Diagnosis not present

## 2019-12-25 DIAGNOSIS — R0902 Hypoxemia: Secondary | ICD-10-CM | POA: Diagnosis not present

## 2019-12-25 DIAGNOSIS — E1129 Type 2 diabetes mellitus with other diabetic kidney complication: Secondary | ICD-10-CM | POA: Diagnosis not present

## 2019-12-25 DIAGNOSIS — Z681 Body mass index (BMI) 19 or less, adult: Secondary | ICD-10-CM | POA: Diagnosis not present

## 2019-12-26 DIAGNOSIS — N186 End stage renal disease: Secondary | ICD-10-CM | POA: Diagnosis not present

## 2019-12-26 DIAGNOSIS — N2581 Secondary hyperparathyroidism of renal origin: Secondary | ICD-10-CM | POA: Diagnosis not present

## 2019-12-26 DIAGNOSIS — D509 Iron deficiency anemia, unspecified: Secondary | ICD-10-CM | POA: Diagnosis not present

## 2019-12-26 DIAGNOSIS — Z992 Dependence on renal dialysis: Secondary | ICD-10-CM | POA: Diagnosis not present

## 2019-12-26 DIAGNOSIS — D631 Anemia in chronic kidney disease: Secondary | ICD-10-CM | POA: Diagnosis not present

## 2019-12-28 ENCOUNTER — Other Ambulatory Visit: Payer: Self-pay | Admitting: Nurse Practitioner

## 2019-12-28 DIAGNOSIS — K5909 Other constipation: Secondary | ICD-10-CM

## 2019-12-29 ENCOUNTER — Inpatient Hospital Stay (HOSPITAL_COMMUNITY)
Admission: EM | Admit: 2019-12-29 | Discharge: 2020-01-02 | DRG: 193 | Disposition: A | Discharge status: Home / Self Care | Payer: Medicare Other | Attending: Internal Medicine | Admitting: Internal Medicine

## 2019-12-29 ENCOUNTER — Emergency Department (HOSPITAL_COMMUNITY): Payer: Medicare Other

## 2019-12-29 ENCOUNTER — Encounter (HOSPITAL_COMMUNITY): Payer: Self-pay

## 2019-12-29 DIAGNOSIS — N186 End stage renal disease: Secondary | ICD-10-CM

## 2019-12-29 DIAGNOSIS — D631 Anemia in chronic kidney disease: Secondary | ICD-10-CM | POA: Diagnosis not present

## 2019-12-29 DIAGNOSIS — E1165 Type 2 diabetes mellitus with hyperglycemia: Secondary | ICD-10-CM | POA: Diagnosis present

## 2019-12-29 DIAGNOSIS — R040 Epistaxis: Secondary | ICD-10-CM | POA: Diagnosis present

## 2019-12-29 DIAGNOSIS — I5032 Chronic diastolic (congestive) heart failure: Secondary | ICD-10-CM | POA: Diagnosis present

## 2019-12-29 DIAGNOSIS — J159 Unspecified bacterial pneumonia: Principal | ICD-10-CM | POA: Diagnosis present

## 2019-12-29 DIAGNOSIS — Z6841 Body Mass Index (BMI) 40.0 and over, adult: Secondary | ICD-10-CM | POA: Diagnosis not present

## 2019-12-29 DIAGNOSIS — E875 Hyperkalemia: Secondary | ICD-10-CM | POA: Diagnosis not present

## 2019-12-29 DIAGNOSIS — R0603 Acute respiratory distress: Secondary | ICD-10-CM

## 2019-12-29 DIAGNOSIS — Z992 Dependence on renal dialysis: Secondary | ICD-10-CM | POA: Diagnosis not present

## 2019-12-29 DIAGNOSIS — E8889 Other specified metabolic disorders: Secondary | ICD-10-CM | POA: Diagnosis present

## 2019-12-29 DIAGNOSIS — T380X5A Adverse effect of glucocorticoids and synthetic analogues, initial encounter: Secondary | ICD-10-CM | POA: Diagnosis present

## 2019-12-29 DIAGNOSIS — Y95 Nosocomial condition: Secondary | ICD-10-CM | POA: Diagnosis present

## 2019-12-29 DIAGNOSIS — K589 Irritable bowel syndrome without diarrhea: Secondary | ICD-10-CM | POA: Diagnosis present

## 2019-12-29 DIAGNOSIS — G2581 Restless legs syndrome: Secondary | ICD-10-CM | POA: Diagnosis present

## 2019-12-29 DIAGNOSIS — Z853 Personal history of malignant neoplasm of breast: Secondary | ICD-10-CM

## 2019-12-29 DIAGNOSIS — J9601 Acute respiratory failure with hypoxia: Secondary | ICD-10-CM

## 2019-12-29 DIAGNOSIS — K111 Hypertrophy of salivary gland: Secondary | ICD-10-CM | POA: Diagnosis present

## 2019-12-29 DIAGNOSIS — Z8249 Family history of ischemic heart disease and other diseases of the circulatory system: Secondary | ICD-10-CM

## 2019-12-29 DIAGNOSIS — D696 Thrombocytopenia, unspecified: Secondary | ICD-10-CM | POA: Diagnosis present

## 2019-12-29 DIAGNOSIS — J1289 Other viral pneumonia: Secondary | ICD-10-CM | POA: Diagnosis not present

## 2019-12-29 DIAGNOSIS — U071 COVID-19: Secondary | ICD-10-CM | POA: Diagnosis not present

## 2019-12-29 DIAGNOSIS — I959 Hypotension, unspecified: Secondary | ICD-10-CM | POA: Diagnosis not present

## 2019-12-29 DIAGNOSIS — E1122 Type 2 diabetes mellitus with diabetic chronic kidney disease: Secondary | ICD-10-CM

## 2019-12-29 DIAGNOSIS — R0902 Hypoxemia: Secondary | ICD-10-CM | POA: Diagnosis not present

## 2019-12-29 DIAGNOSIS — Z833 Family history of diabetes mellitus: Secondary | ICD-10-CM | POA: Diagnosis not present

## 2019-12-29 DIAGNOSIS — I120 Hypertensive chronic kidney disease with stage 5 chronic kidney disease or end stage renal disease: Secondary | ICD-10-CM | POA: Diagnosis not present

## 2019-12-29 DIAGNOSIS — R0602 Shortness of breath: Secondary | ICD-10-CM | POA: Diagnosis not present

## 2019-12-29 DIAGNOSIS — I132 Hypertensive heart and chronic kidney disease with heart failure and with stage 5 chronic kidney disease, or end stage renal disease: Secondary | ICD-10-CM | POA: Diagnosis present

## 2019-12-29 DIAGNOSIS — E1169 Type 2 diabetes mellitus with other specified complication: Secondary | ICD-10-CM

## 2019-12-29 DIAGNOSIS — K219 Gastro-esophageal reflux disease without esophagitis: Secondary | ICD-10-CM | POA: Diagnosis present

## 2019-12-29 DIAGNOSIS — Z8616 Personal history of COVID-19: Secondary | ICD-10-CM

## 2019-12-29 DIAGNOSIS — Z7984 Long term (current) use of oral hypoglycemic drugs: Secondary | ICD-10-CM

## 2019-12-29 DIAGNOSIS — R0689 Other abnormalities of breathing: Secondary | ICD-10-CM | POA: Diagnosis not present

## 2019-12-29 DIAGNOSIS — G4733 Obstructive sleep apnea (adult) (pediatric): Secondary | ICD-10-CM | POA: Diagnosis present

## 2019-12-29 DIAGNOSIS — J189 Pneumonia, unspecified organism: Secondary | ICD-10-CM

## 2019-12-29 DIAGNOSIS — R52 Pain, unspecified: Secondary | ICD-10-CM | POA: Diagnosis not present

## 2019-12-29 LAB — CBC WITH DIFFERENTIAL/PLATELET
Abs Immature Granulocytes: 0.05 10*3/uL (ref 0.00–0.07)
Basophils Absolute: 0 10*3/uL (ref 0.0–0.1)
Basophils Relative: 0 %
Eosinophils Absolute: 0.1 10*3/uL (ref 0.0–0.5)
Eosinophils Relative: 1 %
HCT: 26.1 % — ABNORMAL LOW (ref 36.0–46.0)
Hemoglobin: 8 g/dL — ABNORMAL LOW (ref 12.0–15.0)
Immature Granulocytes: 1 %
Lymphocytes Relative: 10 %
Lymphs Abs: 1.1 10*3/uL (ref 0.7–4.0)
MCH: 29.5 pg (ref 26.0–34.0)
MCHC: 30.7 g/dL (ref 30.0–36.0)
MCV: 96.3 fL (ref 80.0–100.0)
Monocytes Absolute: 0.4 10*3/uL (ref 0.1–1.0)
Monocytes Relative: 3 %
Neutro Abs: 9.3 10*3/uL — ABNORMAL HIGH (ref 1.7–7.7)
Neutrophils Relative %: 85 %
Platelets: 194 10*3/uL (ref 150–400)
RBC: 2.71 MIL/uL — ABNORMAL LOW (ref 3.87–5.11)
RDW: 16.9 % — ABNORMAL HIGH (ref 11.5–15.5)
WBC: 10.9 10*3/uL — ABNORMAL HIGH (ref 4.0–10.5)
nRBC: 0 % (ref 0.0–0.2)

## 2019-12-29 LAB — PROCALCITONIN: Procalcitonin: 2.8 ng/mL

## 2019-12-29 LAB — COMPREHENSIVE METABOLIC PANEL
ALT: 32 U/L (ref 0–44)
AST: 35 U/L (ref 15–41)
Albumin: 3.9 g/dL (ref 3.5–5.0)
Alkaline Phosphatase: 78 U/L (ref 38–126)
Anion gap: 16 — ABNORMAL HIGH (ref 5–15)
BUN: 96 mg/dL — ABNORMAL HIGH (ref 6–20)
CO2: 18 mmol/L — ABNORMAL LOW (ref 22–32)
Calcium: 9 mg/dL (ref 8.9–10.3)
Chloride: 101 mmol/L (ref 98–111)
Creatinine, Ser: 12.17 mg/dL — ABNORMAL HIGH (ref 0.44–1.00)
GFR calc Af Amer: 4 mL/min — ABNORMAL LOW (ref >60)
GFR calc non Af Amer: 3 mL/min — ABNORMAL LOW (ref >60)
Glucose, Bld: 320 mg/dL — ABNORMAL HIGH (ref 70–99)
Potassium: 7.1 mmol/L (ref 3.5–5.1)
Sodium: 135 mmol/L (ref 135–145)
Total Bilirubin: 0.9 mg/dL (ref 0.3–1.2)
Total Protein: 7.5 g/dL (ref 6.5–8.1)

## 2019-12-29 LAB — POTASSIUM: Potassium: 4.4 mmol/L (ref 3.5–5.1)

## 2019-12-29 LAB — LACTATE DEHYDROGENASE: LDH: 245 U/L — ABNORMAL HIGH (ref 98–192)

## 2019-12-29 LAB — CBG MONITORING, ED: Glucose-Capillary: 160 mg/dL — ABNORMAL HIGH (ref 70–99)

## 2019-12-29 LAB — FERRITIN: Ferritin: 1896 ng/mL — ABNORMAL HIGH (ref 11–307)

## 2019-12-29 LAB — TRIGLYCERIDES: Triglycerides: 64 mg/dL (ref <150)

## 2019-12-29 LAB — TROPONIN I (HIGH SENSITIVITY)
Troponin I (High Sensitivity): 23 ng/L — ABNORMAL HIGH (ref <18)
Troponin I (High Sensitivity): 32 ng/L — ABNORMAL HIGH (ref <18)

## 2019-12-29 LAB — FIBRINOGEN: Fibrinogen: 451 mg/dL (ref 210–475)

## 2019-12-29 LAB — ABO/RH: ABO/RH(D): O POS

## 2019-12-29 LAB — D-DIMER, QUANTITATIVE: D-Dimer, Quant: 1.29 ug/mL-FEU — ABNORMAL HIGH (ref 0.00–0.50)

## 2019-12-29 LAB — HIV ANTIBODY (ROUTINE TESTING W REFLEX): HIV Screen 4th Generation wRfx: NONREACTIVE

## 2019-12-29 LAB — C-REACTIVE PROTEIN: CRP: 1 mg/dL — ABNORMAL HIGH (ref <1.0)

## 2019-12-29 LAB — LACTIC ACID, PLASMA: Lactic Acid, Venous: 1.7 mmol/L (ref 0.5–1.9)

## 2019-12-29 MED ORDER — ONDANSETRON HCL 4 MG PO TABS: 4.0000 mg | ORAL_TABLET | Freq: Four times a day (QID) | ORAL | Status: DC | PRN

## 2019-12-29 MED ORDER — SODIUM CHLORIDE 0.9 % IV SOLN: 100.0000 mL | INTRAVENOUS | Status: DC | PRN

## 2019-12-29 MED ORDER — ROPINIROLE HCL 1 MG PO TABS
ORAL_TABLET | ORAL | Status: AC
  Filled 2019-12-29: qty 2

## 2019-12-29 MED ORDER — FENTANYL CITRATE (PF) 100 MCG/2ML IJ SOLN
50.0000 ug | Freq: Once | INTRAMUSCULAR | Status: AC
  Administered 2019-12-29: 14:00:00 50 ug via INTRAVENOUS
  Filled 2019-12-29: qty 2

## 2019-12-29 MED ORDER — NIFEDIPINE ER OSMOTIC RELEASE 60 MG PO TB24
60.0000 mg | ORAL_TABLET | Freq: Every evening | ORAL | Status: DC
  Administered 2019-12-29 – 2020-01-01 (×3): 60 mg via ORAL
  Filled 2019-12-29 (×2): qty 1
  Filled 2019-12-29: qty 2
  Filled 2019-12-29 (×2): qty 1

## 2019-12-29 MED ORDER — SODIUM CHLORIDE 0.9 % IV SOLN
2.0000 g | Freq: Once | INTRAVENOUS | Status: AC
  Administered 2019-12-29: 2 g via INTRAVENOUS
  Filled 2019-12-29: qty 2

## 2019-12-29 MED ORDER — ZINC SULFATE 220 (50 ZN) MG PO CAPS
220.0000 mg | ORAL_CAPSULE | Freq: Every day | ORAL | Status: DC
  Administered 2020-01-02: 220 mg via ORAL
  Filled 2019-12-29 (×4): qty 1

## 2019-12-29 MED ORDER — INSULIN ASPART 100 UNIT/ML ~~LOC~~ SOLN
15.0000 [IU] | Freq: Once | SUBCUTANEOUS | Status: AC
  Administered 2019-12-29: 15 [IU] via SUBCUTANEOUS
  Filled 2019-12-29: qty 1

## 2019-12-29 MED ORDER — LIDOCAINE HCL (PF) 1 % IJ SOLN
5.0000 mL | INTRAMUSCULAR | Status: DC | PRN
  Filled 2019-12-29: qty 5

## 2019-12-29 MED ORDER — ACETAMINOPHEN 325 MG PO TABS
650.0000 mg | ORAL_TABLET | Freq: Four times a day (QID) | ORAL | Status: DC | PRN
  Administered 2019-12-29 – 2020-01-02 (×8): 650 mg via ORAL
  Filled 2019-12-29 (×8): qty 2

## 2019-12-29 MED ORDER — SODIUM CHLORIDE 0.9 % IV SOLN
1.0000 g | Freq: Once | INTRAVENOUS | Status: DC
  Filled 2019-12-29: qty 1

## 2019-12-29 MED ORDER — INSULIN ASPART 100 UNIT/ML ~~LOC~~ SOLN
0.0000 [IU] | Freq: Three times a day (TID) | SUBCUTANEOUS | Status: DC
  Administered 2019-12-30: 1 [IU] via SUBCUTANEOUS
  Administered 2019-12-30: 3 [IU] via SUBCUTANEOUS

## 2019-12-29 MED ORDER — ALBUTEROL SULFATE HFA 108 (90 BASE) MCG/ACT IN AERS
1.0000 | INHALATION_SPRAY | Freq: Four times a day (QID) | RESPIRATORY_TRACT | Status: DC | PRN
  Filled 2019-12-29: qty 6.7

## 2019-12-29 MED ORDER — CHLORHEXIDINE GLUCONATE CLOTH 2 % EX PADS: 6.0000 | MEDICATED_PAD | Freq: Every day | CUTANEOUS | Status: DC

## 2019-12-29 MED ORDER — ONDANSETRON HCL 4 MG/2ML IJ SOLN
4.0000 mg | Freq: Four times a day (QID) | INTRAMUSCULAR | Status: DC | PRN
  Administered 2020-01-01 (×2): 4 mg via INTRAVENOUS
  Filled 2019-12-29 (×2): qty 2

## 2019-12-29 MED ORDER — SODIUM BICARBONATE 8.4 % IV SOLN
INTRAVENOUS | Status: AC
  Filled 2019-12-29: qty 50

## 2019-12-29 MED ORDER — ROPINIROLE HCL ER 2 MG PO TB24
2.0000 mg | ORAL_TABLET | Freq: Every day | ORAL | Status: DC
  Administered 2019-12-29 – 2020-01-01 (×3): 2 mg via ORAL
  Filled 2019-12-29 (×7): qty 1

## 2019-12-29 MED ORDER — SODIUM CHLORIDE 0.9 % IV SOLN
100.0000 mg | Freq: Every day | INTRAVENOUS | Status: AC
  Administered 2019-12-30 – 2020-01-02 (×4): 100 mg via INTRAVENOUS
  Filled 2019-12-29 (×5): qty 20

## 2019-12-29 MED ORDER — SODIUM CHLORIDE 0.9 % IV SOLN: 1.0000 g | Freq: Once | INTRAVENOUS | Status: DC

## 2019-12-29 MED ORDER — SODIUM BICARBONATE 8.4 % IV SOLN
25.0000 meq | Freq: Once | INTRAVENOUS | Status: AC
  Administered 2019-12-29: 25 meq via INTRAVENOUS

## 2019-12-29 MED ORDER — GUAIFENESIN-DM 100-10 MG/5ML PO SYRP: 10.0000 mL | ORAL_SOLUTION | ORAL | Status: DC | PRN

## 2019-12-29 MED ORDER — ASCORBIC ACID 500 MG PO TABS
500.0000 mg | ORAL_TABLET | Freq: Every day | ORAL | Status: DC
  Administered 2020-01-02: 15:00:00 500 mg via ORAL
  Filled 2019-12-29 (×4): qty 1

## 2019-12-29 MED ORDER — NITROGLYCERIN 0.4 MG SL SUBL
0.4000 mg | SUBLINGUAL_TABLET | Freq: Once | SUBLINGUAL | Status: AC
  Administered 2019-12-29: 14:00:00 0.4 mg via SUBLINGUAL
  Filled 2019-12-29: qty 1

## 2019-12-29 MED ORDER — ALBUTEROL SULFATE HFA 108 (90 BASE) MCG/ACT IN AERS
2.0000 | INHALATION_SPRAY | RESPIRATORY_TRACT | Status: DC
  Administered 2019-12-29 (×2): 2 via RESPIRATORY_TRACT
  Filled 2019-12-29: qty 6.7

## 2019-12-29 MED ORDER — DEXAMETHASONE SODIUM PHOSPHATE 10 MG/ML IJ SOLN
6.0000 mg | Freq: Once | INTRAMUSCULAR | Status: AC
  Administered 2019-12-29: 6 mg via INTRAVENOUS
  Filled 2019-12-29: qty 1

## 2019-12-29 MED ORDER — SODIUM POLYSTYRENE SULFONATE 15 GM/60ML PO SUSP
30.0000 g | Freq: Once | ORAL | Status: DC
  Filled 2019-12-29: qty 120

## 2019-12-29 MED ORDER — DEXTROSE 50 % IV SOLN
1.0000 | Freq: Once | INTRAVENOUS | Status: AC
  Administered 2019-12-29: 15:00:00 50 mL via INTRAVENOUS
  Filled 2019-12-29: qty 50

## 2019-12-29 MED ORDER — DEXAMETHASONE SODIUM PHOSPHATE 4 MG/ML IJ SOLN
4.0000 mg | Freq: Once | INTRAMUSCULAR | Status: AC
  Administered 2019-12-29: 4 mg via INTRAVENOUS
  Filled 2019-12-29: qty 1

## 2019-12-29 MED ORDER — SODIUM ZIRCONIUM CYCLOSILICATE 5 G PO PACK
10.0000 g | PACK | Freq: Once | ORAL | Status: AC
  Administered 2019-12-29: 10 g via ORAL
  Filled 2019-12-29: qty 2

## 2019-12-29 MED ORDER — PENTAFLUOROPROP-TETRAFLUOROETH EX AERO: 1.0000 "application " | INHALATION_SPRAY | CUTANEOUS | Status: DC | PRN

## 2019-12-29 MED ORDER — ALBUTEROL SULFATE HFA 108 (90 BASE) MCG/ACT IN AERS
2.0000 | INHALATION_SPRAY | RESPIRATORY_TRACT | Status: DC
  Administered 2019-12-29: 2 via RESPIRATORY_TRACT

## 2019-12-29 MED ORDER — HEPARIN SODIUM (PORCINE) 5000 UNIT/ML IJ SOLN
5000.0000 [IU] | Freq: Three times a day (TID) | INTRAMUSCULAR | Status: DC
  Administered 2019-12-29 – 2019-12-30 (×2): 5000 [IU] via SUBCUTANEOUS
  Filled 2019-12-29 (×3): qty 1

## 2019-12-29 MED ORDER — INSULIN ASPART 100 UNIT/ML ~~LOC~~ SOLN
5.0000 [IU] | Freq: Once | SUBCUTANEOUS | Status: AC
  Administered 2019-12-29: 5 [IU] via INTRAVENOUS
  Filled 2019-12-29: qty 1

## 2019-12-29 MED ORDER — LIDOCAINE-PRILOCAINE 2.5-2.5 % EX CREA: 1.0000 "application " | TOPICAL_CREAM | CUTANEOUS | Status: DC | PRN

## 2019-12-29 MED ORDER — CALCIUM GLUCONATE-NACL 1-0.675 GM/50ML-% IV SOLN
1.0000 g | Freq: Once | INTRAVENOUS | Status: AC
  Administered 2019-12-29: 1000 mg via INTRAVENOUS
  Filled 2019-12-29: qty 50

## 2019-12-29 MED ORDER — SODIUM CHLORIDE 0.9 % IV SOLN
200.0000 mg | Freq: Once | INTRAVENOUS | Status: AC
  Administered 2019-12-29: 21:00:00 200 mg via INTRAVENOUS
  Filled 2019-12-29: qty 40

## 2019-12-29 MED ORDER — SODIUM CHLORIDE 0.9 % IV SOLN: 2.0000 g | INTRAVENOUS | Status: DC

## 2019-12-29 MED ORDER — SODIUM CHLORIDE 0.9 % IV SOLN
1.0000 g | Freq: Once | INTRAVENOUS | Status: AC
  Administered 2019-12-29: 1 g via INTRAVENOUS
  Filled 2019-12-29 (×2): qty 1

## 2019-12-29 MED ORDER — HYDROCOD POLST-CPM POLST ER 10-8 MG/5ML PO SUER: 5.0000 mL | Freq: Two times a day (BID) | ORAL | Status: DC | PRN

## 2019-12-29 MED ORDER — IPRATROPIUM-ALBUTEROL 20-100 MCG/ACT IN AERS
1.0000 | INHALATION_SPRAY | Freq: Four times a day (QID) | RESPIRATORY_TRACT | Status: DC
  Administered 2019-12-29: 21:00:00 1 via RESPIRATORY_TRACT
  Filled 2019-12-29: qty 4

## 2019-12-29 MED ORDER — VANCOMYCIN HCL 2000 MG/400ML IV SOLN
2000.0000 mg | Freq: Once | INTRAVENOUS | Status: AC
  Administered 2019-12-29: 2000 mg via INTRAVENOUS
  Filled 2019-12-29: qty 400

## 2019-12-29 NOTE — Procedures (Signed)
    EMERGENT HEMODIALYSIS TREATMENT NOTE:  HD performed in ED d/t K 7.1. LUE AVF was cannulated with 15g needles (antegrade).  Max Qb 350 to keep VP<240. Treatment was initiated on 0K bath for 30 minutes, then switched to 2K.  Stat K thirty minutes into treatment resulted at 4.4.  Attempted to wean O2 without success; got down to 4L before reporting, "I'm having a little trouble breathing over here."  spO2 was still 100% at 4L but O2 was increased back to 8L before patient felt relief.  2.5 hour treatment completed with 3022cc removed.  All blood was returned and hemostasis was achieved in 15 minutes.  Rockwell Alexandria, RN

## 2019-12-29 NOTE — Consult Note (Addendum)
[facility] Renal Consultation Note  Requesting MD: Barton Dubois MD Indication for Consultation: Hyperkalemia, end-stage renal disease, hypoxia in a dialysis patient  Chief complaint: Shortness of breath  HPI:  Patricia [name] is a 49 y.o. female with a history of end-stage renal disease on hemodialysis [facility] in [place] (normally Monday Wednesday Friday but has been TTS recently for the covid shift), hypertension, obstructive sleep apnea, and diabetes who presented to the hospital with shortness of breath.  Last dialysis was Saturday.  She was found to be Covid + 12/27 per ER report.  She has been on 10 L of oxygen at the time of consult.  Noted that she was also hyperkalemic and have ordered emergent HD here at [facility].  Spoke with ER re: oxygen requirement and hospitalist rec transfer to [facility] - agree.  K has returned 7.1 - s/p temporizing measures in ER.  Called ER and hospitalist and set up HD.  Note she received calcium gluconate, bicarb, lokelma, albuterol, insulin in ER.  She has received dexamethasone and cefepime and is ordered for vanc and remdesivir as well.  She has been weaned from 10 liters to 7 liters oxygen as of my exam.  States she was in [facility] when diagnosed with covid.  States has tried to watch her fluids.  No cramping with HD recently.  No dizziness.  PMHx:   Past Medical History:  Diagnosis Date  . Anemia   . Ankle fracture   . Arthritis   . Blood transfusion without reported diagnosis   . Breast cancer ([place])   . Cancer ([place])   . Diabetes mellitus without complication ([place])   . Dialysis patient ([place])    mon, wed, friday,   . End stage renal disease on dialysis [facility])    M/W/F [facility] in [place]  . GERD (gastroesophageal reflux disease)   . Hypertension   . OSA (obstructive sleep apnea)    uses CPAP sometimes  . Pneumonia   . PONV (postoperative nausea and vomiting)   . Wears glasses     Past Surgical History:  Procedure Laterality  Date  . ABDOMINAL HYSTERECTOMY    . AV FISTULA PLACEMENT  11/2014   at [place] N/A 07/10/2016   Procedure: BALLOON DILATION;  Surgeon: Danie Binder, MD;  [facility];  Service: Endoscopy;  Laterality: N/A;  Pyloric dilation  . BREAST LUMPECTOMY    . CESAREAN SECTION    . CHOLECYSTECTOMY    . COLONOSCOPY WITH PROPOFOL N/A 09/27/2016   Dr. Gala Romney: Internal hemorrhoids repeat colonoscopy in 10 years  . DILATION AND CURETTAGE OF UTERUS    . ESOPHAGOGASTRODUODENOSCOPY N/A 07/10/2016   Dr.Fields- normal esophagus, gastric stenosis was found at the pylorus, gastritis on bx, normal examined duodenun  . EXTERNAL FIXATION REMOVAL Right 10/29/2018   Procedure: REMOVAL RIGHT ANKLE BIOMET ZIMMER EXTERNAL FIXATOR, SHORT LEG CAST APPLICATION;  Surgeon: Marybelle Killings, MD;  Location: [place];  Service: Orthopedics;  Laterality: Right;  . MASTECTOMY     left sided  . ORIF ANKLE FRACTURE Right 10/06/2018   Procedure: OPEN REDUCTION INTERNAL FIXATION (ORIF) RIGHT ANKLE TRIMALLEOLAR;  Surgeon: Marybelle Killings, MD;  Location: [place];  Service: Orthopedics;  Laterality: Right;    Family Hx:  Family History  Problem Relation Age of Onset  . Diabetes Mellitus II Mother   . Hypertension Mother   . Hypertension Sister   . Hypertension Sister   . Colon cancer Neg  Hx     Social History:  reports that she has never smoked. She has never used smokeless tobacco. She reports that she does not drink alcohol or use drugs.  Allergies:  Allergies  Allergen Reactions  . Amlodipine Besylate Rash and Other (See Comments)    dizziness  . Reglan [Metoclopramide] Other (See Comments)    hallucinations     Medications: Prior to Admission medications   Medication Sig Start Date End Date Taking? Authorizing Provider  albuterol (PROVENTIL HFA;VENTOLIN HFA) 108 (90 Base) MCG/ACT inhaler Inhale 2 puffs into the lungs every 6 (six) hours as needed for wheezing or shortness of breath.  11/28/17   Kathie Dike, MD  albuterol (PROVENTIL) (2.5 MG/3ML) 0.083% nebulizer solution Take 3 mLs (2.5 mg total) by nebulization every 6 (six) hours as needed for wheezing or shortness of breath. 11/28/17   Kathie Dike, MD  cinacalcet (SENSIPAR) 30 MG tablet Take 30 mg by mouth every evening.     [provider]  dicyclomine (BENTYL) 10 MG capsule TAKE 1 CAPSULE (10 MG TOTAL) BY MOUTH 2 (TWO) TIMES DAILY AS NEEDED FOR SPASMS. 10/06/19 01/04/20  Annitta Needs, NP  Ferric Citrate (AURYXIA) 1 GM 210 MG(Fe) TABS Take 2 tablets by mouth 2 (two) times daily with a meal. With Breakfast & with supper    [provider]  furosemide (LASIX) 40 MG tablet Take 40-80 mg by mouth 2 (two) times daily. Take 2 tablets (80mg ) in the morning and take 1 tablet (40mg ) at bedtime    [provider]  glipiZIDE (GLUCOTROL) 5 MG tablet Take 5 mg by mouth 2 (two) times daily as needed. For blood sugar levels over 150    [provider]  ipratropium (ATROVENT) 0.03 % nasal spray SPRAY 2 SPRAYS INTO EACH NOSTRIL TWICE A DAY 06/06/19   [provider]  multivitamin (RENA-VIT) TABS tablet Take 1 tablet by mouth daily.    [provider]  NIFEdipine (PROCARDIA-XL/ADALAT CC) 60 MG 24 hr tablet Take 1 tablet (60 mg total) by mouth daily. Patient taking differently: Take 60 mg by mouth every evening.  02/28/18   Kathie Dike, MD  ondansetron (ZOFRAN-ODT) 4 MG disintegrating tablet Take 4 mg by mouth every 8 (eight) hours as needed for nausea or vomiting.    [provider]  pantoprazole (PROTONIX) 40 MG tablet Take 40 mg by mouth daily.    [provider]  polyethylene glycol (MIRALAX) packet Take 17 g by mouth daily. Patient taking differently: Take 17 g by mouth daily as needed.  10/14/18   Isla Pence, MD  rOPINIRole (REQUIP XL) 2 MG 24 hr tablet Take 2 mg by mouth at bedtime.  09/10/16   [provider]  sevelamer carbonate (RENVELA) 800  MG tablet Take 800 mg by mouth See admin instructions. Take 2 tablets (1600 mg) in the morning, take 3 tablets (2400 mg) with lunch or snack,  and take 2 tablets (1600 mg) by mouth in the evening with dinner meal.    [provider]  TRULANCE 3 MG TABS TAKE 1 TABLET BY MOUTH EVERY DAY 10/30/19   Erenest Rasher, PA-C    I have reviewed the patient's reported and current medications.  Labs:  BMP Latest Ref Rng & Units 12/29/2019 06/29/2019 01/02/2019  Glucose 70 - 99 mg/dL 320(H) - 304(H)  BUN 6 - 20 mg/dL 96(H) 24 17  Creatinine 0.44 - 1.00 mg/dL 12.17(H) 5.93(H) 4.75(H)  BUN/Creat Ratio 6 - 22 (calc) -  4(L) -  Sodium 135 - 145 mmol/L 135 - 140  Potassium 3.5 - 5.1 mmol/L 7.1(HH) - 3.3(L)  Chloride 98 - 111 mmol/L 101 - 99  CO2 22 - 32 mmol/L 18(L) - 27  Calcium 8.9 - 10.3 mg/dL 9.0 - 9.7    Urinalysis    Component Value Date/Time   COLORURINE STRAW (A) 12/22/2018 0847   APPEARANCEUR CLEAR 12/22/2018 0847   LABSPEC 1.007 12/22/2018 0847   PHURINE 9.0 (H) 12/22/2018 0847   GLUCOSEU >=500 (A) 12/22/2018 0847   HGBUR NEGATIVE 12/22/2018 0847   [place] 12/22/2018 0847   [place] 12/22/2018 0847   PROTEINUR 100 (A) 12/22/2018 0847   NITRITE NEGATIVE 12/22/2018 0847   LEUKOCYTESUR NEGATIVE 12/22/2018 0847     ROS:  Pertinent items noted in HPI and remainder of comprehensive ROS otherwise negative.   Physical Exam: Vitals:   12/29/19 1630 12/29/19 1715  BP: (!) 174/92 (!) 187/161  Pulse: 87 86  Resp: (!) 33 (!) 30  Temp:    SpO2: 100% 100%     General: Adult female in stretcher in no acute distress at rest.  Some shortness of breath with prolonged speech HEENT: Normocephalic atraumatic  Eyes: Extraocular movements intact sclera anicteric Neck: Supple increased circumference trachea midline Heart: S1-S2 no rub Lungs: Decreased breath sounds and increased work of breathing with prolonged speech on 7 L O2 Abdomen: Soft obese habitus  nontender Extremities: 1+ edema bilateral lower extremities Skin: No rash on extremities exposed Neuro: Alert and oriented x3 provides a history follows commands Psych normal mood and affect Access left upper extremity AV fistula in use   Assessment/Plan:  # Hyperkalemia - Emergent dialysis here at [place] - 2.5 hour tx ordered  - 0 k bath for 30 minutes then 2K+ - requested K after the 0 K bath is completed and notify on call nephrologist if K over 5.5   # Acute hypoxic respiratory failure - Secondary in part to Covid  - Supplemental oxygen as needed - Optimize volume status  # COVID-19 pneumonia - Therapies per primary team  # End-stage renal disease - Hemodialysis tonight emergently. - Assess dialysis needs daily  - Plan for HD on 1/14 unless needed urgently sooner  # Anemia CKD - Orders and HD meds not available. - Anticipate need for ESA  # HTN  - HTN - improving control  - Will UF with HD as able to optimize   # DM type 2 - per primary team - would monitor glucose closely as note s/p insulin in ER   Claudia Desanctis 12/29/2019, 5:51 PM

## 2019-12-29 NOTE — ED Provider Notes (Addendum)
[facility] Provider Note   CSN: [id number] Arrival date & time: 12/29/19  1230     History Chief Complaint  Patient presents with  . Respiratory Distress    Patricia [name] is a 49 y.o. female.  Level 5 caveat for acuity of condition.  Chief complaint severe dyspnea and diaphoresis..  Diagnosed with Covid on 12/13/2019.  Was not admitted.  On Eliquis recently, but discontinued on Friday.  End-stage renal disease-last dialysis Saturday.  Past medical history includes ESRD, diabetes, hypertension, many others.  Review of systems positive for chest pain.        Past Medical History:  Diagnosis Date  . Anemia   . Ankle fracture   . Arthritis   . Blood transfusion without reported diagnosis   . Breast cancer ([place])   . Cancer ([place])   . Diabetes mellitus without complication ([place])   . Dialysis patient ([place])    mon, wed, friday,   . End stage renal disease on dialysis [facility])    M/W/F [facility] in [place]  . GERD (gastroesophageal reflux disease)   . Hypertension   . OSA (obstructive sleep apnea)    uses CPAP sometimes  . Pneumonia   . PONV (postoperative nausea and vomiting)   . Wears glasses     Patient Active Problem List   Diagnosis Date Noted  . Closed right ankle fracture 10/06/2018  . Closed displaced trimalleolar fracture of right ankle 10/06/2018  . Acute colitis 02/26/2018  . Hypoxemia 02/24/2018  . Dyspnea 11/26/2017  . Anemia 11/26/2017  . Elevated troponin 11/26/2017  . Hyperkalemia 09/29/2017  . Acute respiratory failure ([place]) 09/28/2017  . CAP (community acquired pneumonia) 09/26/2017  . Volume overload 09/25/2017  . Acute on chronic diastolic CHF (congestive heart failure) ([place]) 09/25/2017  . Lactic acidosis 09/25/2017  . Hypokalemia 09/25/2017  . Breast cancer ([place]) 07/22/2017  . SOB (shortness of breath)   . Acute respiratory failure with hypoxia ([place]) 07/21/2017  . Flatulence 10/25/2016  . GERD (gastroesophageal reflux  disease) 08/02/2016  . Pyloric stenosis, acquired   . Nausea with vomiting   . Generalized abdominal pain   . Chest pain, rule out acute myocardial infarction 07/04/2016  . Constipation 07/04/2016  . Nausea 07/04/2016  . Essential hypertension 07/04/2016  . HCAP (healthcare-associated pneumonia) 12/04/2015  . End stage renal disease on dialysis ([place]) 12/04/2015  . Diabetes mellitus ([place]) 12/04/2015    Past Surgical History:  Procedure Laterality Date  . ABDOMINAL HYSTERECTOMY    . AV FISTULA PLACEMENT  11/2014   at [facility] N/A 07/10/2016   Procedure: BALLOON DILATION;  Surgeon: Danie Binder, MD;  [facility];  Service: Endoscopy;  Laterality: N/A;  Pyloric dilation  . BREAST LUMPECTOMY    . CESAREAN SECTION    . CHOLECYSTECTOMY    . COLONOSCOPY WITH PROPOFOL N/A 09/27/2016   Dr. Gala Romney: Internal hemorrhoids repeat colonoscopy in 10 years  . DILATION AND CURETTAGE OF UTERUS    . ESOPHAGOGASTRODUODENOSCOPY N/A 07/10/2016   Dr.Fields- normal esophagus, gastric stenosis was found at the pylorus, gastritis on bx, normal examined duodenun  . EXTERNAL FIXATION REMOVAL Right 10/29/2018   Procedure: REMOVAL RIGHT ANKLE BIOMET ZIMMER EXTERNAL FIXATOR, SHORT LEG CAST APPLICATION;  Surgeon: Marybelle Killings, MD;  Location: [facility];  Service: Orthopedics;  Laterality: Right;  . MASTECTOMY     left sided  . ORIF ANKLE FRACTURE Right 10/06/2018   Procedure: OPEN REDUCTION INTERNAL FIXATION (ORIF) RIGHT  ANKLE TRIMALLEOLAR;  Surgeon: Marybelle Killings, MD;  Location: [facility];  Service: Orthopedics;  Laterality: Right;     OB History    Gravida  3   Para  2   Term  1   Preterm  1   AB  1   Living        SAB  1   TAB      Ectopic      Multiple      Live Births              Family History  Problem Relation Age of Onset  . Diabetes Mellitus II Mother   . Hypertension Mother   . Hypertension Sister   . Hypertension Sister   . Colon cancer Neg  Hx     Social History   Tobacco Use  . Smoking status: Never Smoker  . Smokeless tobacco: Never Used  Substance Use Topics  . Alcohol use: No    Alcohol/week: 0.0 standard drinks  . Drug use: No    Home Medications Prior to Admission medications   Medication Sig Start Date End Date Taking? Authorizing Provider  albuterol (PROVENTIL HFA;VENTOLIN HFA) 108 (90 Base) MCG/ACT inhaler Inhale 2 puffs into the lungs every 6 (six) hours as needed for wheezing or shortness of breath. 11/28/17   Kathie Dike, MD  albuterol (PROVENTIL) (2.5 MG/3ML) 0.083% nebulizer solution Take 3 mLs (2.5 mg total) by nebulization every 6 (six) hours as needed for wheezing or shortness of breath. 11/28/17   Kathie Dike, MD  cinacalcet (SENSIPAR) 30 MG tablet Take 30 mg by mouth every evening.     [provider]  dicyclomine (BENTYL) 10 MG capsule TAKE 1 CAPSULE (10 MG TOTAL) BY MOUTH 2 (TWO) TIMES DAILY AS NEEDED FOR SPASMS. 10/06/19 01/04/20  Annitta Needs, NP  Ferric Citrate (AURYXIA) 1 GM 210 MG(Fe) TABS Take 2 tablets by mouth 2 (two) times daily with a meal. With Breakfast & with supper    [provider]  furosemide (LASIX) 40 MG tablet Take 40-80 mg by mouth 2 (two) times daily. Take 2 tablets (80mg ) in the morning and take 1 tablet (40mg ) at bedtime    [provider]  glipiZIDE (GLUCOTROL) 5 MG tablet Take 5 mg by mouth 2 (two) times daily as needed. For blood sugar levels over 150    [provider]  ipratropium (ATROVENT) 0.03 % nasal spray SPRAY 2 SPRAYS INTO EACH NOSTRIL TWICE A DAY 06/06/19   [provider]  multivitamin (RENA-VIT) TABS tablet Take 1 tablet by mouth daily.    [provider]  NIFEdipine (PROCARDIA-XL/ADALAT CC) 60 MG 24 hr tablet Take 1 tablet (60 mg total) by mouth daily. Patient taking differently: Take 60 mg by mouth every evening.  02/28/18   Kathie Dike, MD  ondansetron (ZOFRAN-ODT) 4 MG disintegrating tablet Take 4  mg by mouth every 8 (eight) hours as needed for nausea or vomiting.    [provider]  pantoprazole (PROTONIX) 40 MG tablet Take 40 mg by mouth daily.    [provider]  polyethylene glycol (MIRALAX) packet Take 17 g by mouth daily. Patient taking differently: Take 17 g by mouth daily as needed.  10/14/18   Isla Pence, MD  rOPINIRole (REQUIP XL) 2 MG 24 hr tablet Take 2 mg by mouth at bedtime.  09/10/16   [provider]  sevelamer carbonate (RENVELA) 800 MG tablet Take 800 mg by mouth See admin instructions. Take  2 tablets (1600 mg) in the morning, take 3 tablets (2400 mg) with lunch or snack,  and take 2 tablets (1600 mg) by mouth in the evening with dinner meal.    [provider]  TRULANCE 3 MG TABS TAKE 1 TABLET BY MOUTH EVERY DAY 10/30/19   Erenest Rasher, PA-C    Allergies    Amlodipine besylate and Reglan [metoclopramide]  Review of Systems   Review of Systems  Unable to perform ROS: Acuity of condition    Physical Exam Updated Vital Signs BP (!) 172/102   Pulse 87   Temp 98.6 F (37 C) (Oral)   Resp (!) 34   Ht 5\' 3"  (1.6 m)   Wt 111.1 kg   SpO2 100%   BMI 43.40 kg/m   Physical Exam Vitals and nursing note reviewed.  Constitutional:      Appearance: She is well-developed.     Comments: Dyspneic, tachypneic  HENT:     Head: Normocephalic and atraumatic.  Eyes:     Conjunctiva/sclera: Conjunctivae normal.  Cardiovascular:     Rate and Rhythm: Normal rate and regular rhythm.  Pulmonary:     Comments: Using accessory muscles Abdominal:     General: Bowel sounds are normal.     Palpations: Abdomen is soft.  Musculoskeletal:        General: Normal range of motion.     Cervical back: Neck supple.  Skin:    General: Skin is warm and dry.  Neurological:     General: No focal deficit present.     Mental Status: She is alert and oriented to person, place, and time.  Psychiatric:        Behavior: Behavior normal.      ED Results / Procedures / Treatments   Labs (all labs ordered are listed, but only abnormal results are displayed) Labs Reviewed  CBC WITH DIFFERENTIAL/PLATELET - Abnormal; Notable for the following components:      Result Value   WBC 10.9 (*)    RBC 2.71 (*)    Hemoglobin 8.0 (*)    HCT 26.1 (*)    RDW 16.9 (*)    Neutro Abs 9.3 (*)    All other components within normal limits  D-DIMER, QUANTITATIVE (NOT AT [facility]) - Abnormal; Notable for the following components:   D-Dimer, Quant 1.29 (*)    All other components within normal limits  COMPREHENSIVE METABOLIC PANEL  TROPONIN I (HIGH SENSITIVITY)    EKG EKG Interpretation  Date/Time:  Tuesday December 29 2019 12:35:11 EST Ventricular Rate:  107 PR Interval:    QRS Duration: 94 QT Interval:  352 QTC Calculation: 470 R Axis:   81 Text Interpretation: Sinus tachycardia Probable anterior infarct, old Minimal ST depression, lateral leads Confirmed by Nat Christen [phone]) on 12/29/2019 1:00:13 PM   Radiology DG Chest Port 1 View  Result Date: 12/29/2019 CLINICAL DATA:  COVID-19 positive. Dialysis patient with short of breath EXAM: PORTABLE CHEST 1 VIEW COMPARISON:  05/14/2019 FINDINGS: Bibasilar airspace disease possible pneumonia given COVID status. Cardiac enlargement.  Negative for edema. IMPRESSION: Bibasilar airspace disease possible pneumonia. Electronically Signed   By: Franchot Gallo M.D.   On: 12/29/2019 13:04    Procedures Procedures (including critical care time)  Medications Ordered in ED Medications  albuterol (VENTOLIN HFA) 108 (90 Base) MCG/ACT inhaler 2 puff (2 puffs Inhalation Given 12/29/19 1302)  dexamethasone (DECADRON) injection 6 mg (6 mg Intravenous Given 12/29/19 1259)    ED Course  I have  reviewed the triage vital signs and the nursing notes.  Pertinent labs & imaging results that were available during my care of the patient were reviewed by me and considered in my medical decision making (see  chart for details).    MDM Rules/Calculators/A&P                      Chief complaint severe dyspnea.  Patient is comfortable on 10 L of nasal oxygen.  Blood pressure has been slightly elevated.  Chest x-ray reveals bibasilar airspace disease.  Will start antibiotics for HCAP and IV dexamethasone.  Discussed renal status with nephrologist on-call Dr. Royce Macadamia.  Recommended transfer to [facility].   1425: Discussed with foster (nephrologist) and Dr.Madera (hospitalist).  Potassium 7.1.  Will Rx Kayexalate, dextrose and insulin, calcium gluconate, albuterol inhaler.  Obtain EKG.  Covid markers ordered.  CRITICAL CARE Performed by: Nat Christen Total critical care time: 65 minutes Critical care time was exclusive of separately billable procedures and treating other patients. Critical care was necessary to treat or prevent imminent or life-threatening deterioration. Critical care was time spent personally by me on the following activities: development of treatment plan with patient and/or surrogate as well as nursing, discussions with consultants, evaluation of patient's response to treatment, examination of patient, obtaining history from patient or surrogate, ordering and performing treatments and interventions, ordering and review of laboratory studies, ordering and review of radiographic studies, pulse oximetry and re-evaluation of patient's condition.   Patricia [name] was evaluated in Emergency Department on 12/29/2019 for the symptoms described in the history of present illness. She was evaluated in the context of the global COVID-19 pandemic, which necessitated consideration that the patient might be at risk for infection with the SARS-CoV-2 virus that causes COVID-19. Institutional protocols and algorithms that pertain to the evaluation of patients at risk for COVID-19 are in a state of rapid change based on information released by regulatory bodies including the CDC and federal and state  organizations. These policies and algorithms were followed during the patient's care in the ED.  Final Clinical Impression(s) / ED Diagnoses Final diagnoses:  Respiratory distress  HCAP (healthcare-associated pneumonia)  ESRD (end stage renal disease) ([place])    Rx / DC Orders ED Discharge Orders    None       Nat Christen, MD 12/29/19 1406    Nat Christen, MD 12/29/19 1428

## 2019-12-29 NOTE — Progress Notes (Signed)
Pharmacy Antibiotic Note  Patricia [name] is a 49 y.o. female admitted on 12/29/2019 with pneumonia.  Pharmacy has been consulted for Vancomycin and Cefepime dosing. COVID (+) on 12/27  Plan: Cefepime 2000 mg IV every MWF with HD Vancomycin 2000 mg IV x 1 dose. Vancomycin 1000 mg IV every MWF with HD. Goal trough 15-20 mcg/mL Monitor labs, c/s, and vanco level as indicated.   Height: 5\' 3"  (160 cm) Weight: 245 lb (111.1 kg) IBW/kg (Calculated) : 52.4  Temp (24hrs), Avg:98.6 F (37 C), Min:98.6 F (37 C), Max:98.6 F (37 C)  Recent Labs  Lab 12/29/19 1306  WBC 10.9*  CREATININE 12.17*    Estimated Creatinine Clearance: 6.6 mL/min (A) (by C-G formula based on SCr of 12.17 mg/dL (H)).    Allergies  Allergen Reactions  . Amlodipine Besylate Rash and Other (See Comments)    dizziness  . Reglan [Metoclopramide] Other (See Comments)    hallucinations     Antimicrobials this admission: Vanco 1/12 >>  Cefepime 1/12 >>   Dose adjustments this admission: Vanco/Cefepime  Microbiology results: 1/12 BCx: pending   1/12 MRSA PCR: pending  Thank you for allowing pharmacy to be a part of this patient's care.  Margot Ables, PharmD Clinical Pharmacist 12/29/2019 2:26 PM

## 2019-12-29 NOTE — H&P (Signed)
History and Physical    Issabelle [name] JXB:[id number] DOB: 09/03/1971 DOA: 12/29/2019  Referring MD/NP/PA: Dr. Lacinda Axon. PCP: Jake Samples, PA-C  Patient coming from: home   Chief Complaint: SOB, general malaise, cough, DOE.  HPI: Patricia [name] is a 49 y.o. female with PMH significant for Morbid Obesity, ESRD, HTN, Type 2 diabetes with nephropathy and OSA; who presented to ED with complaints of worsening SOB, cough, chills/diaphoresis, chest discomfort from coughing and general malaise. Patient diagnosed with COVID on 12/27. Symptoms has worsen and now feels easily winded with minimal exertion. No nausea, no vomiting, no abd pain, no diarrhea, no melena, no hematochezia, no focal weakness.  In the ED found in acute resp distress, hypoxic and requiring 8-10L HFNC; CXR with bilateral infiltrates, severe hyperkalemia and mild elevation of inflammatory markers. No acute EKG changes. Insulin, calcium gluconate, bicarb and lokelma ordered. cultres taken and empirically started on antibiotics for concerns of HCAP along with COVID infection. Remdesivir/Steroids started and nephrology consulted. [facility] contacted to admit patient for further evaluation and management.   Past Medical/Surgical History: Past Medical History:  Diagnosis Date  . Anemia   . Ankle fracture   . Arthritis   . Blood transfusion without reported diagnosis   . Breast cancer (Patricia [name])   . Cancer (Patricia [name])   . Diabetes mellitus without complication ([place])   . Dialysis patient ([place])    mon, wed, friday,   . End stage renal disease on dialysis [facility])    M/W/F [facility] in [place]  . GERD (gastroesophageal reflux disease)   . Hypertension   . OSA (obstructive sleep apnea)    uses CPAP sometimes  . Pneumonia   . PONV (postoperative nausea and vomiting)   . Wears glasses     Past Surgical History:  Procedure Laterality Date  . ABDOMINAL HYSTERECTOMY    . AV FISTULA PLACEMENT  11/2014   at [place] N/A  07/10/2016   Procedure: BALLOON DILATION;  Surgeon: Danie Binder, MD;  [facility];  Service: Endoscopy;  Laterality: N/A;  Pyloric dilation  . BREAST LUMPECTOMY    . CESAREAN SECTION    . CHOLECYSTECTOMY    . COLONOSCOPY WITH PROPOFOL N/A 09/27/2016   Dr. Gala Romney: Internal hemorrhoids repeat colonoscopy in 10 years  . DILATION AND CURETTAGE OF UTERUS    . ESOPHAGOGASTRODUODENOSCOPY N/A 07/10/2016   Dr.Fields- normal esophagus, gastric stenosis was found at the pylorus, gastritis on bx, normal examined duodenun  . EXTERNAL FIXATION REMOVAL Right 10/29/2018   Procedure: REMOVAL RIGHT ANKLE BIOMET ZIMMER EXTERNAL FIXATOR, SHORT LEG CAST APPLICATION;  Surgeon: Marybelle Killings, MD;  Location: [place];  Service: Orthopedics;  Laterality: Right;  . MASTECTOMY     left sided  . ORIF ANKLE FRACTURE Right 10/06/2018   Procedure: OPEN REDUCTION INTERNAL FIXATION (ORIF) RIGHT ANKLE TRIMALLEOLAR;  Surgeon: Marybelle Killings, MD;  Location: [place];  Service: Orthopedics;  Laterality: Right;    Social History:  reports that she has never smoked. She has never used smokeless tobacco. She reports that she does not drink alcohol or use drugs.  Allergies: Allergies  Allergen Reactions  . Amlodipine Besylate Rash and Other (See Comments)    dizziness  . Reglan [Metoclopramide] Other (See Comments)    hallucinations     Family History:  Family History  Problem Relation Age of Onset  . Diabetes Mellitus II Mother   . Hypertension Mother   . Hypertension Sister   . Hypertension Sister   .  Colon cancer Neg Hx     Prior to Admission medications   Medication Sig Start Date End Date Taking? Authorizing Provider  albuterol (PROVENTIL HFA;VENTOLIN HFA) 108 (90 Base) MCG/ACT inhaler Inhale 2 puffs into the lungs every 6 (six) hours as needed for wheezing or shortness of breath. 11/28/17   Kathie Dike, MD  albuterol (PROVENTIL) (2.5 MG/3ML) 0.083% nebulizer solution Take 3 mLs (2.5 mg total) by  nebulization every 6 (six) hours as needed for wheezing or shortness of breath. 11/28/17   Kathie Dike, MD  cinacalcet (SENSIPAR) 30 MG tablet Take 30 mg by mouth every evening.     [provider]  dicyclomine (BENTYL) 10 MG capsule TAKE 1 CAPSULE (10 MG TOTAL) BY MOUTH 2 (TWO) TIMES DAILY AS NEEDED FOR SPASMS. 10/06/19 01/04/20  Annitta Needs, NP  Ferric Citrate (AURYXIA) 1 GM 210 MG(Fe) TABS Take 2 tablets by mouth 2 (two) times daily with a meal. With Breakfast & with supper    [provider]  furosemide (LASIX) 40 MG tablet Take 40-80 mg by mouth 2 (two) times daily. Take 2 tablets (80mg ) in the morning and take 1 tablet (40mg ) at bedtime    [provider]  glipiZIDE (GLUCOTROL) 5 MG tablet Take 5 mg by mouth 2 (two) times daily as needed. For blood sugar levels over 150    [provider]  ipratropium (ATROVENT) 0.03 % nasal spray SPRAY 2 SPRAYS INTO EACH NOSTRIL TWICE A DAY 06/06/19   [provider]  multivitamin (RENA-VIT) TABS tablet Take 1 tablet by mouth daily.    [provider]  NIFEdipine (PROCARDIA-XL/ADALAT CC) 60 MG 24 hr tablet Take 1 tablet (60 mg total) by mouth daily. Patient taking differently: Take 60 mg by mouth every evening.  02/28/18   Kathie Dike, MD  ondansetron (ZOFRAN-ODT) 4 MG disintegrating tablet Take 4 mg by mouth every 8 (eight) hours as needed for nausea or vomiting.    [provider]  pantoprazole (PROTONIX) 40 MG tablet Take 40 mg by mouth daily.    [provider]  polyethylene glycol (MIRALAX) packet Take 17 g by mouth daily. Patient taking differently: Take 17 g by mouth daily as needed.  10/14/18   Isla Pence, MD  rOPINIRole (REQUIP XL) 2 MG 24 hr tablet Take 2 mg by mouth at bedtime.  09/10/16   [provider]  sevelamer carbonate (RENVELA) 800 MG tablet Take 800 mg by mouth See admin instructions. Take 2 tablets (1600 mg) in the morning, take 3 tablets (2400 mg)  with lunch or snack,  and take 2 tablets (1600 mg) by mouth in the evening with dinner meal.    [provider]  TRULANCE 3 MG TABS TAKE 1 TABLET BY MOUTH EVERY DAY 10/30/19   Erenest Rasher, PA-C    Review of Systems:  Negative except as otherwise mentioned on HPI.   Physical Exam: Vitals:   12/29/19 1930 12/29/19 1945 12/29/19 2000 12/29/19 2015  BP: 140/80 (!) 141/81 (!) 158/89 (!) 142/87  Pulse: 84 82 84 81  Resp: 18 (!) 22 (!) 26 (!) 28  Temp:      TempSrc:      SpO2: 100%  100%   Weight:      Height:        Constitutional: in acute resp distress, using 8-10 L HFNC, complaining of diaphoresis and general malaise.  Eyes: PERRL, lids and conjunctivae normal, no icterus. ENMT: Mucous membranes are moist. Posterior pharynx clear  of any exudate or lesions.  Neck: normal, supple, no masses, no thyromegaly, unable to assess JVD with body habitus. Respiratory: positive tachypnea, mild use of accessory muscles, having difficulty speaking in full sentences. Patient with positive rhonchi bilaterally, no wheezing. Cardiovascular: Regular rate and rhythm, no murmurs / rubs / gallops. 2+ pedal pulses. No carotid bruits.  Abdomen: no tenderness, no masses palpated. No hepatosplenomegaly. Bowel sounds positive.  Musculoskeletal: no clubbing / cyanosis. No joint deformity upper and lower extremities. Good ROM, no contractures. Normal muscle tone.  Skin: no rashes, lesions, ulcers. No induration Neurologic: CN 2-12 grossly intact. Sensation intact, DTR normal. Strength 5/5 in all 4.  Psychiatric: Normal judgment and insight. Alert and oriented x 3. Normal mood.    Labs on Admission: I have personally reviewed the following labs and imaging studies  CBC: Recent Labs  Lab 12/29/19 1306  WBC 10.9*  NEUTROABS 9.3*  HGB 8.0*  HCT 26.1*  MCV 96.3  PLT 093   Basic Metabolic Panel: Recent Labs  Lab 12/29/19 1306 12/29/19 1827  NA 135  --   K 7.1* 4.4  CL 101  --   CO2  18*  --   GLUCOSE 320*  --   BUN 96*  --   CREATININE 12.17*  --   CALCIUM 9.0  --    GFR: Estimated Creatinine Clearance: 6.6 mL/min (A) (by C-G formula based on SCr of 12.17 mg/dL (H)).   Liver Function Tests: Recent Labs  Lab 12/29/19 1306  AST 35  ALT 32  ALKPHOS 78  BILITOT 0.9  PROT 7.5  ALBUMIN 3.9   Lipid Profile: Recent Labs    12/29/19 1500  TRIG 64   Anemia Panel: Recent Labs    12/29/19 1500  FERRITIN 1,896*   Urine analysis:    Component Value Date/Time   COLORURINE STRAW (A) 12/22/2018 0847   APPEARANCEUR CLEAR 12/22/2018 0847   LABSPEC 1.007 12/22/2018 0847   PHURINE 9.0 (H) 12/22/2018 0847   GLUCOSEU >=500 (A) 12/22/2018 0847   HGBUR NEGATIVE 12/22/2018 0847   [place] NEGATIVE 12/22/2018 [place] 12/22/2018 0847   PROTEINUR 100 (A) 12/22/2018 0847   NITRITE NEGATIVE 12/22/2018 0847   LEUKOCYTESUR NEGATIVE 12/22/2018 0847    Recent Results (from the past 240 hour(s))  Blood Culture (routine x 2)     Status: None (Preliminary result)   Collection Time: 12/29/19  2:59 PM   Specimen: Blood  Result Value Ref Range Status   Specimen Description BLOOD RIGHT FOREARM  Final   Special Requests   Final    BOTTLES DRAWN AEROBIC AND ANAEROBIC Blood Culture adequate volume Performed at [facility], [address], [place], [place] [address]    Culture PENDING  Incomplete   Report Status PENDING  Incomplete  Blood Culture (routine x 2)     Status: None (Preliminary result)   Collection Time: 12/29/19  3:33 PM   Specimen: Blood  Result Value Ref Range Status   Specimen Description RIGHT ANTECUBITAL  Final   Special Requests   Final    BOTTLES DRAWN AEROBIC AND ANAEROBIC Blood Culture adequate volume Performed at [facility], [address] [place], [place] [address]    Culture PENDING  Incomplete   Report Status PENDING  Incomplete     Radiological Exams on Admission: DG Chest Port 1 View  Result Date: 12/29/2019  CLINICAL DATA:  COVID-19 positive. Dialysis patient with short of breath EXAM: PORTABLE CHEST 1 VIEW COMPARISON:  05/14/2019 FINDINGS:  Bibasilar airspace disease possible pneumonia given COVID status. Cardiac enlargement.  Negative for edema. IMPRESSION: Bibasilar airspace disease possible pneumonia. Electronically Signed   By: Franchot Gallo M.D.   On: 12/29/2019 13:04    EKG: Independently reviewed. Non specific ST depression, sinus rhythm, normal QT.  Assessment/Plan 1-Acute respiratory failure with hypoxia due to COVID-19(HCC) -patient with positive COIVD test on 12/13/19 -CXR demonstarting bilateral infiltrates -requiring 8-10 L on oxygen supplementation -will start treatment with remdesivir and steroids -WBC's 10.9 -abx's empirically given in ED and cultres taken -PRN bronchodilators, antipyretics, vit C and Zinc ordered -follow inflammatory markers and clinical response. -wean off O2 supplementation as tolerated  2-hyperkalemia -K 7.1 -calcium gluconate, bicarb, insulin, Lokelma and albuterol ordered -patient due for HD today; nephrology will arrange for emergent HD. -telemetry monitoring -no acute abnormalities on EKG.  3-ESRD -continue HD tx -appreciated assistance and recommendations by nephrology service -patient schedule T-T-S  4-type 2 diabetes with nephropathy -will check A1C -holding oral hypoglycemic agents while inpatient -SSI started -follow CBG's; especially with steroids treatment.  5-HTN -continue nifedipine -HD will also assist with BP management  6-anemia of CKD -no signs of overt bleeding -will follow Hgb trend -iron and epogen therapy as per nephrology discretion.  7-Restless leg syndrome -continue Requip  8-morbid obesity -Body mass index is 43.39 kg/m. -low calorie diet, portion control and lifestyle changes discussed with patient.     DVT prophylaxis: Heparin  Code Status: Full Family Communication: no family at bedside.   Disposition Plan: hopefully discharge home once resp status and acute COVID infection stabilized.  Consults called: nephrology service (Dr. Royce Macadamia). Admission status: inpatient, progressive unit, [facility]. LOS > 2 midnights.    Time Spent: 70 minutes  Barton Dubois MD [facility] Pager [phone]    12/29/2019, 8:54 PM

## 2019-12-29 NOTE — ED Notes (Signed)
CRITICAL VALUE ALERT  Critical Value:  K 7.1  Date & Time Notied:  12/29/2019, 1423  Provider Notified: Dr Lacinda Axon  Orders Received/Actions taken: see chart

## 2019-12-29 NOTE — ED Notes (Signed)
Pt currently getting dialyzed

## 2019-12-29 NOTE — ED Triage Notes (Signed)
COVID + on 12/27. Dialysis pt. Was taken off of Eliquis on Friday. Was only put on Eliquis for COVID. Pt became very SOB today and diaphoretic. Lung sounds clear. BP 99/58. Was scheduled for dialysis today.

## 2019-12-30 ENCOUNTER — Other Ambulatory Visit: Payer: Self-pay

## 2019-12-30 ENCOUNTER — Encounter (HOSPITAL_COMMUNITY): Payer: Self-pay | Admitting: Internal Medicine

## 2019-12-30 DIAGNOSIS — J189 Pneumonia, unspecified organism: Secondary | ICD-10-CM

## 2019-12-30 LAB — HEMOGLOBIN A1C
Hgb A1c MFr Bld: 7.3 % — ABNORMAL HIGH (ref 4.8–5.6)
Mean Plasma Glucose: 162.81 mg/dL

## 2019-12-30 LAB — GLUCOSE, CAPILLARY
Glucose-Capillary: 109 mg/dL — ABNORMAL HIGH (ref 70–99)
Glucose-Capillary: 174 mg/dL — ABNORMAL HIGH (ref 70–99)
Glucose-Capillary: 283 mg/dL — ABNORMAL HIGH (ref 70–99)
Glucose-Capillary: 383 mg/dL — ABNORMAL HIGH (ref 70–99)

## 2019-12-30 LAB — SARS CORONAVIRUS 2 (TAT 6-24 HRS): SARS Coronavirus 2: NEGATIVE

## 2019-12-30 MED ORDER — DEXAMETHASONE SODIUM PHOSPHATE 10 MG/ML IJ SOLN
6.0000 mg | INTRAMUSCULAR | Status: DC
  Administered 2019-12-30 – 2020-01-02 (×4): 6 mg via INTRAVENOUS
  Filled 2019-12-30 (×4): qty 1

## 2019-12-30 MED ORDER — SALINE SPRAY 0.65 % NA SOLN
1.0000 | NASAL | Status: DC | PRN
  Administered 2019-12-31: 1 via NASAL
  Filled 2019-12-30: qty 44

## 2019-12-30 MED ORDER — PANTOPRAZOLE SODIUM 40 MG PO TBEC
40.0000 mg | DELAYED_RELEASE_TABLET | Freq: Every day | ORAL | Status: DC
  Administered 2019-12-30 – 2020-01-02 (×4): 40 mg via ORAL
  Filled 2019-12-30 (×5): qty 1

## 2019-12-30 MED ORDER — HEPARIN SODIUM (PORCINE) 5000 UNIT/ML IJ SOLN
7500.0000 [IU] | Freq: Three times a day (TID) | INTRAMUSCULAR | Status: DC
  Administered 2019-12-30: 7500 [IU] via SUBCUTANEOUS
  Filled 2019-12-30: qty 2

## 2019-12-30 MED ORDER — IPRATROPIUM BROMIDE 0.06 % NA SOLN
2.0000 | Freq: Two times a day (BID) | NASAL | Status: DC
  Administered 2019-12-30 – 2020-01-02 (×6): 2 via NASAL
  Filled 2019-12-30 (×2): qty 15

## 2019-12-30 MED ORDER — CINACALCET HCL 30 MG PO TABS
30.0000 mg | ORAL_TABLET | Freq: Every morning | ORAL | Status: DC
  Administered 2019-12-30 – 2020-01-02 (×4): 30 mg via ORAL
  Filled 2019-12-30 (×5): qty 1

## 2019-12-30 MED ORDER — IPRATROPIUM-ALBUTEROL 20-100 MCG/ACT IN AERS
1.0000 | INHALATION_SPRAY | Freq: Two times a day (BID) | RESPIRATORY_TRACT | Status: DC
  Administered 2019-12-30 – 2020-01-02 (×6): 1 via RESPIRATORY_TRACT
  Filled 2019-12-30: qty 4

## 2019-12-30 MED ORDER — SODIUM CHLORIDE 0.9 % IV SOLN
2.0000 g | INTRAVENOUS | Status: DC
  Administered 2019-12-31: 2 g via INTRAVENOUS
  Filled 2019-12-30: qty 2

## 2019-12-30 MED ORDER — RENA-VITE PO TABS
1.0000 | ORAL_TABLET | Freq: Every day | ORAL | Status: DC
  Administered 2019-12-30 – 2020-01-02 (×4): 1 via ORAL
  Filled 2019-12-30 (×5): qty 1

## 2019-12-30 MED ORDER — VANCOMYCIN HCL IN DEXTROSE 750-5 MG/150ML-% IV SOLN
750.0000 mg | INTRAVENOUS | Status: DC
  Filled 2019-12-30: qty 150

## 2019-12-30 MED ORDER — CHLORHEXIDINE GLUCONATE CLOTH 2 % EX PADS
6.0000 | MEDICATED_PAD | Freq: Every day | CUTANEOUS | Status: DC
  Administered 2019-12-31: 6 via TOPICAL

## 2019-12-30 NOTE — Progress Notes (Addendum)
PROGRESS NOTE   Patricia [name]  YSA:[id number]    DOB: 11/25/1971    DOA: 12/29/2019  PCP: Jake Samples, PA-C   I have briefly reviewed patients previous medical records in [facility].  Chief Complaint:   Chief Complaint  Patient presents with   Respiratory Distress    Brief Narrative:  49 year old female, PMH of DM 2/IDDM, HTN, ESRD on TTS HD, morbid obesity, OSA, chronic diastolic CHF anemia, GERD, recently diagnosed with COVID-19 on 12/27 and has been on home oxygen since, presented to [facility] ED on 1/12 due to progressive dyspnea, cough with chest discomfort, generalized malaise, chills and diaphoresis and found to be in acute respiratory distress, hypoxic requiring 8-10 L HFNC oxygen, chest x-ray with bilateral infiltrates, severe hyperkalemia >7 and mildly elevated inflammatory markers.  Received cocktail for hyperkalemia, initiated treatment for COVID-19 infection and antibiotics for suspected HCAP, nephrology consulted, underwent shortened HD at [facility] and transferred to [facility] for further management.   Assessment & Plan:  Active Problems:   Acute respiratory failure with hypoxia (HCC)   Acute respiratory failure with hypoxia due to suspected bacterial pneumonia complicating recent Covid 19 infection, OSA and possible decompensated CHF.  Initial Covid test positive on 12/13/2019.  Covid test from 12/29/2019 is negative.  Chest x-ray 1/12: Bibasilar airspace disease possibly pneumonia.  Procalcitonin significantly elevated/2.8.  Inflammatory markers i.e. LDH (245), CRP (1), fibrinogen not significantly elevated.  Ferritin 1896.  Lactate normal.  Although remdesivir and steroids were empirically started, consider stopping these prior to next dose tomorrow after discussing with ID.  Continue empirically started IV cefepime and vancomycin.  Blood cultures x2: Negative to date.  Currently on HFNC 6 L/min, saturating at 98%, continue to wean as tolerated.  Volume  management across HD.  Hyperkalemia  Potassium 7.1 on arrival.  S/p calcium gluconate, bicarbonate, insulin, Lokelma and albuterol.  Also got short and cycle of HD at [facility].  Hyperkalemia resolved.  ESRD on TTS HD  Nephrology consulted for dialysis needs.  Next HD on 1/14.  Type II DM with renal complications  Mildly uncontrolled.  Continue SSI.  Holding oral hypoglycemics.  Essential hypertension  Continue [place]-acting nifedipine.  Reasonably controlled.  Anemia of ESRD  Hemoglobin 8.  Appears to be her baseline.  Restless leg syndrome  Continue Requip.  Body mass index is 43.39 kg/m.  Morbid obesity  Headache/parotid enlargement  Patient complains of headache, pain in her jaw area.  Bilateral parotid gland enlargement without acute findings.  Do not suspect acute parotitis at this time.  Supportive treatment with analgesics and monitor closely.  Also reports some dry nose and some blood on sneezing.  Will add nasal saline spray.   DVT prophylaxis: Subcutaneous heparin> discontinued due to intermittent epistaxis.  SCDs added. Code Status: Full Family Communication: None at bedside Disposition: DC home pending clinical improvement.   Consultants:   Nephrology  Procedures:   HD  Antimicrobials:   Cefepime and vancomycin   Subjective:  Dyspnea improved but breathing not yet at baseline.  States that she used to be on MWF HD but following recent hospitalization, has been on TTS HD.  Has been on home oxygen since end of December.  Complains of headache and pain underneath bilateral jaw and feels like she may have lymph node enlargement.  Objective:   Vitals:   12/30/19 0756 12/30/19 0800 12/30/19 1019 12/30/19 1200  BP: (!) 152/75 (!) 151/80    Pulse:      Resp:  15 16 16    Temp:    98.2 F (36.8 C)  TempSrc:    Oral  SpO2: 98%  98%   Weight:      Height:        General exam: Pleasant young female, moderately built and morbidly obese sitting up  comfortably in bed without distress. Respiratory system: Slightly diminished breath sounds in the bases with occasional basal crackles but otherwise clear to auscultation.  No increased work of breathing Cardiovascular system: S1 & S2 heard, RRR. No JVD, murmurs, rubs, gallops or clicks. No pedal edema.  Not on telemetry. Gastrointestinal system: Abdomen is nondistended, soft and nontender. No organomegaly or masses felt. Normal bowel sounds heard. Central nervous system: Alert and oriented. No focal neurological deficits. Extremities: Symmetric 5 x 5 power. Skin: No rashes, lesions or ulcers Psychiatry: Judgement and insight appear normal. Mood & affect appropriate. ENT: Diffuse enlargement of bilateral parotid glands without acute findings, unsure if this is chronic for her.  No cervical lymphadenopathy appreciated.    Data Reviewed:   I have personally reviewed following labs and imaging studies   CBC: Recent Labs  Lab 12/29/19 1306  WBC 10.9*  NEUTROABS 9.3*  HGB 8.0*  HCT 26.1*  MCV 96.3  PLT 213    Basic Metabolic Panel: Recent Labs  Lab 12/29/19 1306 12/29/19 1827  NA 135  --   K 7.1* 4.4  CL 101  --   CO2 18*  --   GLUCOSE 320*  --   BUN 96*  --   CREATININE 12.17*  --   CALCIUM 9.0  --     Liver Function Tests: Recent Labs  Lab 12/29/19 1306  AST 35  ALT 32  ALKPHOS 78  BILITOT 0.9  PROT 7.5  ALBUMIN 3.9    CBG: Recent Labs  Lab 12/29/19 2146 12/30/19 0841 12/30/19 1213  GLUCAP 160* 109* 174*    Microbiology Studies:   Recent Results (from the past 240 hour(s))  Blood Culture (routine x 2)     Status: None (Preliminary result)   Collection Time: 12/29/19  2:59 PM   Specimen: BLOOD RIGHT FOREARM  Result Value Ref Range Status   Specimen Description BLOOD RIGHT FOREARM  Final   Special Requests   Final    BOTTLES DRAWN AEROBIC AND ANAEROBIC Blood Culture adequate volume   Culture   Final    NO GROWTH < 24 HOURS Performed at [facility], [address], [place], [place] [address]    Report Status PENDING  Incomplete  Blood Culture (routine x 2)     Status: None (Preliminary result)   Collection Time: 12/29/19  3:33 PM   Specimen: Right Antecubital; Blood  Result Value Ref Range Status   Specimen Description RIGHT ANTECUBITAL  Final   Special Requests   Final    BOTTLES DRAWN AEROBIC AND ANAEROBIC Blood Culture results may not be optimal due to an inadequate volume of blood received in culture bottles   Culture   Final    NO GROWTH < 24 HOURS Performed at [facility], [address], [place], [place] [address]    Report Status PENDING  Incomplete  SARS CORONAVIRUS 2 (TAT 6-24 HRS) Nasopharyngeal Nasopharyngeal Swab     Status: None   Collection Time: 12/29/19  9:10 PM   Specimen: Nasopharyngeal Swab  Result Value Ref Range Status   SARS Coronavirus 2 NEGATIVE NEGATIVE Final    Comment: (NOTE) SARS-CoV-2 target nucleic acids are NOT DETECTED. The  SARS-CoV-2 RNA is generally detectable in upper and lower respiratory specimens during the acute phase of infection. Negative results do not preclude SARS-CoV-2 infection, do not rule out co-infections with other pathogens, and should not be used as the sole basis for treatment or other patient management decisions. Negative results must be combined with clinical observations, patient history, and epidemiological information. The expected result is Negative. Fact Sheet for Patients: SugarRoll.be Fact Sheet for Healthcare Providers: https://www.woods-mathews.com/ This test is not yet approved or cleared by the [place] FDA and  has been authorized for detection and/or diagnosis of SARS-CoV-2 by FDA under an Emergency Use Authorization (EUA). This EUA will remain  in effect (meaning this test can be used) for the duration of the COVID-19 declaration under Section 56 4(b)(1) of the Act, 21 U.S.C. section 360bbb-3(b)(1), unless  the authorization is terminated or revoked sooner. Performed at [facility], [place] [address] [place], [place] [address]      Radiology Studies:  No results found.   Scheduled Meds:    vitamin C  500 mg Oral Daily   Chlorhexidine Gluconate Cloth  6 each Topical Q0600   [START ON 12/31/2019] Chlorhexidine Gluconate Cloth  6 each Topical Q0600   cinacalcet  30 mg Oral q morning - 10a   dexamethasone (DECADRON) injection  6 mg Intravenous Q24H   heparin  7,500 Units Subcutaneous Q8H   insulin aspart  0-6 Units Subcutaneous TID WC   ipratropium  2 spray Each Nare BID   Ipratropium-Albuterol  1 puff Inhalation BID   multivitamin  1 tablet Oral Daily   NIFEdipine  60 mg Oral QPM   pantoprazole  40 mg Oral Daily   rOPINIRole  2 mg Oral QHS   zinc sulfate  220 mg Oral Daily    Continuous Infusions:    sodium chloride     sodium chloride     [START ON 12/31/2019] ceFEPime (MAXIPIME) IV     remdesivir 100 mg in NS 100 mL 100 mg (12/30/19 0922)   [START ON 12/31/2019] vancomycin       LOS: 1 day     Vernell Leep, MD, [place], [facility]. [facility]    To contact the attending provider between 7A-7P or the covering provider during after hours 7P-7A, please log into the web site [email or web address] and access using universal [place] password for that web site. If you do not have the password, please call the hospital operator.  12/30/2019, 5:32 PM

## 2019-12-30 NOTE — Consult Note (Signed)
Renal Service Consult Note [facility]  Patricia [name] 12/30/2019 Sol Blazing Requesting Physician:  Dr Algis Liming  Reason for Consult:  ESRD pt w/ COVID+ infection HPI: The patient is a 49 y.o. year-old /w hx of DM2 on insulin, HTN, ESRD on HD since 2016, hx diast CHF, OSA. Pt came to ED 12/29/2019 for SOB, cough, chills/ sweats, CP and malaise after being dx'd with COVID+ on 12/27. Some DOE. In the ED pt was in resp distress and hypoxemic on 10 L HFNC.  CXR showed bilat infiltrates and K+ was > 7. No ekg change. Pt rec'd remdesivir and IV steroids and nephrology consulted and pt had shortened HD 2.5 hrs overnight at [facility] for ^K+ and question vol overload.  3.0L were removed and today K is 4.8. pt transferred to [facility] for admission. Asked to see for esrd.   Pt seen in room, no distress, nasal O2.  occ cough, no cp today. No real c/o's.  Wants her binders .   ROS  denies CP  no joint pain   no HA  no blurry vision  no rash  no diarrhea  no nausea/ vomiting     Past Medical History  Past Medical History:  Diagnosis Date  . Anemia   . Ankle fracture   . Arthritis   . Blood transfusion without reported diagnosis   . Breast cancer ([place])   . Cancer ([place])   . Diabetes mellitus without complication ([place])   . Dialysis patient ([place])    mon, wed, friday,   . End stage renal disease on dialysis [facility])    M/W/F [facility] in [place]  . GERD (gastroesophageal reflux disease)   . Hypertension   . OSA (obstructive sleep apnea)    uses CPAP sometimes  . Pneumonia   . PONV (postoperative nausea and vomiting)   . Wears glasses    Past Surgical History  Past Surgical History:  Procedure Laterality Date  . ABDOMINAL HYSTERECTOMY    . AV FISTULA PLACEMENT  11/2014   at [place] N/A 07/10/2016   Procedure: BALLOON DILATION;  Surgeon: Danie Binder, MD;  Location[facility];  Service: Endoscopy;  Laterality: N/A;  Pyloric dilation  . BREAST  LUMPECTOMY    . CESAREAN SECTION    . CHOLECYSTECTOMY    . COLONOSCOPY WITH PROPOFOL N/A 09/27/2016   Dr. Gala Romney: Internal hemorrhoids repeat colonoscopy in 10 years  . DILATION AND CURETTAGE OF UTERUS    . ESOPHAGOGASTRODUODENOSCOPY N/A 07/10/2016   Dr.Fields- normal esophagus, gastric stenosis was found at the pylorus, gastritis on bx, normal examined duodenun  . EXTERNAL FIXATION REMOVAL Right 10/29/2018   Procedure: REMOVAL RIGHT ANKLE BIOMET ZIMMER EXTERNAL FIXATOR, SHORT LEG CAST APPLICATION;  Surgeon: Marybelle Killings, MD;  Location: [place];  Service: Orthopedics;  Laterality: Right;  . MASTECTOMY     left sided  . ORIF ANKLE FRACTURE Right 10/06/2018   Procedure: OPEN REDUCTION INTERNAL FIXATION (ORIF) RIGHT ANKLE TRIMALLEOLAR;  Surgeon: Marybelle Killings, MD;  Location: [place];  Service: Orthopedics;  Laterality: Right;   Family History  Family History  Problem Relation Age of Onset  . Diabetes Mellitus II Mother   . Hypertension Mother   . Hypertension Sister   . Hypertension Sister   . Colon cancer Neg Hx    Social History  reports that she has never smoked. She has never used smokeless tobacco. She reports that she does not drink alcohol or use  drugs. Allergies  Allergies  Allergen Reactions  . Amlodipine Besylate Rash and Other (See Comments)    dizziness  . Reglan [Metoclopramide] Other (See Comments)    hallucinations    Home medications Prior to Admission medications   Medication Sig Start Date End Date Taking? Authorizing Provider  albuterol (PROVENTIL HFA;VENTOLIN HFA) 108 (90 Base) MCG/ACT inhaler Inhale 2 puffs into the lungs every 6 (six) hours as needed for wheezing or shortness of breath. 11/28/17  Yes Kathie Dike, MD  albuterol (PROVENTIL) (2.5 MG/3ML) 0.083% nebulizer solution Take 3 mLs (2.5 mg total) by nebulization every 6 (six) hours as needed for wheezing or shortness of breath. 11/28/17  Yes Kathie Dike, MD  cinacalcet (SENSIPAR) 30 MG tablet Take 30  mg by mouth every morning.    Yes [provider]  dicyclomine (BENTYL) 10 MG capsule TAKE 1 CAPSULE (10 MG TOTAL) BY MOUTH 2 (TWO) TIMES DAILY AS NEEDED FOR SPASMS. 10/06/19 01/04/20 Yes Annitta Needs, NP  Ferric Citrate (AURYXIA) 1 GM 210 MG(Fe) TABS Take 2 tablets by mouth 2 (two) times daily with a meal. With Breakfast & with supper   Yes [provider]  FLOVENT HFA 110 MCG/ACT inhaler 1 puff 2 (two) times daily. 12/04/19  Yes [provider]  furosemide (LASIX) 40 MG tablet Take 40-80 mg by mouth 2 (two) times daily. Take 2 tablets (80mg ) in the morning and take 1 tablet (40mg ) at bedtime   Yes [provider]  glipiZIDE (GLUCOTROL) 5 MG tablet Take 5 mg by mouth 2 (two) times daily as needed. For blood sugar levels over 150   Yes [provider]  ipratropium (ATROVENT) 0.03 % nasal spray Place 2 sprays into both nostrils 2 (two) times daily.  06/06/19  Yes [provider]  multivitamin (RENA-VIT) TABS tablet Take 1 tablet by mouth daily.   Yes [provider]  NIFEdipine (PROCARDIA-XL/ADALAT CC) 60 MG 24 hr tablet Take 1 tablet (60 mg total) by mouth daily. Patient taking differently: Take 60 mg by mouth every evening.  02/28/18  Yes Kathie Dike, MD  ondansetron (ZOFRAN-ODT) 4 MG disintegrating tablet Take 4 mg by mouth every 8 (eight) hours as needed for nausea or vomiting.   Yes [provider]  pantoprazole (PROTONIX) 40 MG tablet Take 40 mg by mouth daily.   Yes [provider]  polyethylene glycol (MIRALAX) packet Take 17 g by mouth daily. Patient taking differently: Take 17 g by mouth daily as needed.  10/14/18  Yes Isla Pence, MD  rOPINIRole (REQUIP XL) 2 MG 24 hr tablet Take 2 mg by mouth at bedtime.  09/10/16  Yes [provider]  sevelamer carbonate (RENVELA) 800 MG tablet Take 800 mg by mouth See admin instructions. Take 2 tablets (1600 mg) in the morning, take 3 tablets (2400 mg) with  lunch or snack,  and take 2 tablets (1600 mg) by mouth in the evening with dinner meal.   Yes [provider]  TRULANCE 3 MG TABS TAKE 1 TABLET BY MOUTH EVERY DAY Patient taking differently: Take 3 mg by mouth daily.  10/30/19  Yes Erenest Rasher, PA-C   Liver Function Tests Recent Labs  Lab 12/29/19 1306  AST 35  ALT 32  ALKPHOS 78  BILITOT 0.9  PROT 7.5  ALBUMIN 3.9   No results for input(s): LIPASE, AMYLASE in the last 168 hours. CBC Recent Labs  Lab 12/29/19 1306  WBC 10.9*  NEUTROABS 9.3*  HGB 8.0*  HCT  26.1*  MCV 96.3  PLT 616   Basic Metabolic Panel Recent Labs  Lab 12/29/19 1306 12/29/19 1827  NA 135  --   K 7.1* 4.4  CL 101  --   CO2 18*  --   GLUCOSE 320*  --   BUN 96*  --   CREATININE 12.17*  --   CALCIUM 9.0  --    Iron/TIBC/Ferritin/ %Sat    Component Value Date/Time   FERRITIN 1,896 (H) 12/29/2019 1500    Vitals:   12/30/19 0756 12/30/19 0800 12/30/19 1019 12/30/19 1200  BP: (!) 152/75 (!) 151/80    Pulse:      Resp: 15 16 16    Temp:    98.2 F (36.8 C)  TempSrc:    Oral  SpO2: 98%  98%   Weight:      Height:        Exam Gen alert, no distress No rash, cyanosis or gangrene Sclera anicteric, throat clear  No jvd or bruits Chest clear bilat to bases , occ rhonchi RRR no MRG Abd soft ntnd no mass or ascites +bs obese GU defer MS no joint effusions or deformity Ext no LE or UE edema, no wounds or ulcers Neuro is alert, Ox 3 , nf LUA AVF+bruit    Home meds:  - renvela 2 am+ 3 lunch+ 2 dinner/ auryxia 2 am and 2 w/ dinner  - procardia xl 60 qd/ lasix 80 am and 40 pm  - cinacalcet 30 qd  - glipizide 5 bid/ trulance 3mg  qd  - ropinirole 2 hs/ pantoprazole 40 qd  - prn albuterol/ flovent hfa  - prn's/ vitamins/ supplements   CXR bibasilar opacities 1/12   Outpt HD: [facility] MWF usual HD >> on HD TTS covid shift in [place] at this time   4h 81min   400/600   110.5kg   Hep 1000+ 1800/hr  2/2.5  bath L AVF    Assessment/ Plan: 1. COVID+ infection / PNA - looking better  2. ESRD - HD since 2016, LUA AVF. Cont TTS sched for covid pts. HD tomorrow.  3. HTN/vol - cont procardia, cont to lower vol as tolerated 4. Acute resp failure w/ hypoxia - seems better after steroids/ HD 5. DM on insulin 6. Anemia ckd - Hb 8.0, get records from [facility] 7. MBD ckd - cont meds 8. Hyperkalemia - severe, resolved      Kelly Splinter  MD 12/30/2019, 2:41 PM

## 2019-12-30 NOTE — ED Notes (Signed)
Attempted to call report to [facility]

## 2019-12-30 NOTE — ED Notes (Signed)
Attempted to call report to inpatient unit ([id number])

## 2019-12-30 NOTE — ED Notes (Signed)
ED TO INPATIENT HANDOFF REPORT  ED Nurse Name and Phone #:  Jonelle Sidle, [place]  S Name/Age/Gender Patricia [name] 49 y.o. female Room/Bed: APA10/APA10  Code Status   Code Status: Full Code  Home/SNF/Other Home Patient oriented to: self, place, time and situation Is this baseline? Yes   Triage Complete: Triage complete  Chief Complaint Acute respiratory failure with hypoxia ([place]) [J96.01]  Triage Note COVID + on 12/27. Dialysis pt. Was taken off of Eliquis on Friday. Was only put on Eliquis for COVID. Pt became very SOB today and diaphoretic. Lung sounds clear. BP 99/58. Was scheduled for dialysis today.     Allergies Allergies  Allergen Reactions  . Amlodipine Besylate Rash and Other (See Comments)    dizziness  . Reglan [Metoclopramide] Other (See Comments)    hallucinations     Level of Care/Admitting Diagnosis ED Disposition    ED Disposition Condition Comment   Admit  Hospital Area: [facility] [[id number]]  Level of Care: Progressive [102]  Admit to Progressive based on following criteria: Other see comments  Comments: 10 L HFNC, COVID positive , ESRD, hyperkalemia.  Covid Evaluation: Confirmed COVID Positive  Diagnosis: Acute respiratory failure with hypoxia [facility]) [329924]  Admitting Physician: Barton Dubois [[id number]]  Attending Physician: Barton Dubois [[id number]]  Estimated length of stay: past midnight tomorrow  Certification:: I certify this patient will need inpatient services for at least 2 midnights       B Medical/Surgery History Past Medical History:  Diagnosis Date  . Anemia   . Ankle fracture   . Arthritis   . Blood transfusion without reported diagnosis   . Breast cancer ([place])   . Cancer ([place])   . Diabetes mellitus without complication ([place])   . Dialysis patient ([place])    mon, wed, friday,   . End stage renal disease on dialysis [facility])    M/W/F [facility] in [place]  . GERD (gastroesophageal reflux disease)   . Hypertension    . OSA (obstructive sleep apnea)    uses CPAP sometimes  . Pneumonia   . PONV (postoperative nausea and vomiting)   . Wears glasses    Past Surgical History:  Procedure Laterality Date  . ABDOMINAL HYSTERECTOMY    . AV FISTULA PLACEMENT  11/2014   at [facility] N/A 07/10/2016   Procedure: BALLOON DILATION;  Surgeon: Danie Binder, MD;  Location[facility];  Service: Endoscopy;  Laterality: N/A;  Pyloric dilation  . BREAST LUMPECTOMY    . CESAREAN SECTION    . CHOLECYSTECTOMY    . COLONOSCOPY WITH PROPOFOL N/A 09/27/2016   Dr. Gala Romney: Internal hemorrhoids repeat colonoscopy in 10 years  . DILATION AND CURETTAGE OF UTERUS    . ESOPHAGOGASTRODUODENOSCOPY N/A 07/10/2016   Dr.Fields- normal esophagus, gastric stenosis was found at the pylorus, gastritis on bx, normal examined duodenun  . EXTERNAL FIXATION REMOVAL Right 10/29/2018   Procedure: REMOVAL RIGHT ANKLE BIOMET ZIMMER EXTERNAL FIXATOR, SHORT LEG CAST APPLICATION;  Surgeon: Marybelle Killings, MD;  Location: [place];  Service: Orthopedics;  Laterality: Right;  . MASTECTOMY     left sided  . ORIF ANKLE FRACTURE Right 10/06/2018   Procedure: OPEN REDUCTION INTERNAL FIXATION (ORIF) RIGHT ANKLE TRIMALLEOLAR;  Surgeon: Marybelle Killings, MD;  Location: [place];  Service: Orthopedics;  Laterality: Right;     A IV Location/Drains/Wounds Patient Lines/Drains/Airways Status   Active Line/Drains/Airways    Name:   Placement date:   Placement time:  Site:   Days:   Peripheral IV 12/29/19 Right Forearm   12/29/19    1910    Forearm   1   Peripheral IV 12/29/19 Right Wrist   12/29/19    1911    Wrist   1   Fistula / Graft Left Upper arm   --    --    Upper arm      Fistula / Graft Left Upper arm   --    --    Upper arm      Closed System Drain 1 Left Breast Bulb (JP)   09/24/17    2158    Breast   827   Closed System Drain 2 Left Breast Bulb (JP)   09/24/17    2159    Breast   827   Incision (Closed) 09/25/17 Breast Left    09/25/17    2300     826   Incision (Closed) 10/06/18 Ankle Right   10/06/18    1605     450   Incision (Closed) 10/29/18 Leg Right   10/29/18    1322     427   EXTERNAL FIXATOR   10/06/18    1842    --   450          Intake/Output Last 24 hours  Intake/Output Summary (Last 24 hours) at 12/30/2019 0120 Last data filed at 12/29/2019 2020 Gross per 24 hour  Intake --  Output 3022 ml  Net -3022 ml    Labs/Imaging Results for orders placed or performed during the hospital encounter of 12/29/19 (from the past 48 hour(s))  CBC with Differential     Status: Abnormal   Collection Time: 12/29/19  1:06 PM  Result Value Ref Range   WBC 10.9 (H) 4.0 - 10.5 K/uL   RBC 2.71 (L) 3.87 - 5.11 MIL/uL   Hemoglobin 8.0 (L) 12.0 - 15.0 g/dL   HCT 26.1 (L) 36.0 - 46.0 %   MCV 96.3 80.0 - 100.0 fL   MCH 29.5 26.0 - 34.0 pg   MCHC 30.7 30.0 - 36.0 g/dL   RDW 16.9 (H) 11.5 - 15.5 %   Platelets 194 150 - 400 K/uL   nRBC 0.0 0.0 - 0.2 %   Neutrophils Relative % 85 %   Neutro Abs 9.3 (H) 1.7 - 7.7 K/uL   Lymphocytes Relative 10 %   Lymphs Abs 1.1 0.7 - 4.0 K/uL   Monocytes Relative 3 %   Monocytes Absolute 0.4 0.1 - 1.0 K/uL   Eosinophils Relative 1 %   Eosinophils Absolute 0.1 0.0 - 0.5 K/uL   Basophils Relative 0 %   Basophils Absolute 0.0 0.0 - 0.1 K/uL   Immature Granulocytes 1 %   Abs Immature Granulocytes 0.05 0.00 - 0.07 K/uL    Comment: Performed at [facility], [address], [place], [place] [address]  Comprehensive metabolic panel     Status: Abnormal   Collection Time: 12/29/19  1:06 PM  Result Value Ref Range   Sodium 135 135 - 145 mmol/L   Potassium 7.1 (HH) 3.5 - 5.1 mmol/L    Comment: CRITICAL RESULT CALLED TO, READ BACK BY AND VERIFIED WITH: CARDWELL,L AT 1418 ON 1.12.21 BY ISLEY,B    Chloride 101 98 - 111 mmol/L   CO2 18 (L) 22 - 32 mmol/L   Glucose, Bld 320 (H) 70 - 99 mg/dL   BUN 96 (H) 6 - 20 mg/dL   Creatinine, Ser 12.17 (H)  0.44 - 1.00 mg/dL   Calcium 9.0 8.9 -  10.3 mg/dL   Total Protein 7.5 6.5 - 8.1 g/dL   Albumin 3.9 3.5 - 5.0 g/dL   AST 35 15 - 41 U/L   ALT 32 0 - 44 U/L   Alkaline Phosphatase 78 38 - 126 U/L   Total Bilirubin 0.9 0.3 - 1.2 mg/dL   GFR calc non Af Amer 3 (L) >60 mL/min   GFR calc Af Amer 4 (L) >60 mL/min   Anion gap 16 (H) 5 - 15    Comment: Performed at [facility], [address] [place], [place] [address]  Troponin I (High Sensitivity)     Status: Abnormal   Collection Time: 12/29/19  1:06 PM  Result Value Ref Range   Troponin I (High Sensitivity) 23 (H) <18 ng/L    Comment: (NOTE) Elevated high sensitivity troponin I (hsTnI) values and significant  changes across serial measurements may suggest ACS but many other  chronic and acute conditions are known to elevate hsTnI results.  Refer to the Links section for chest pain algorithms and additional  guidance. Performed at [facility], [address], [place], [place] [address]   D-dimer, quantitative (not at [facility])     Status: Abnormal   Collection Time: 12/29/19  1:06 PM  Result Value Ref Range   D-Dimer, Quant 1.29 (H) 0.00 - 0.50 ug/mL-FEU    Comment: (NOTE) At the manufacturer cut-off of 0.50 ug/mL FEU, this assay has been documented to exclude PE with a sensitivity and negative predictive value of 97 to 99%.  At this time, this assay has not been approved by the FDA to exclude DVT/VTE. Results should be correlated with clinical presentation. Performed at [facility], [address], [place], [place] [address]   Lactic acid, plasma     Status: None   Collection Time: 12/29/19  2:58 PM  Result Value Ref Range   Lactic Acid, Venous 1.7 0.5 - 1.9 mmol/L    Comment: Performed at [facility], [address] [place], [place] [address]  Blood Culture (routine x 2)     Status: None (Preliminary result)   Collection Time: 12/29/19  2:59 PM   Specimen: Blood  Result Value Ref Range   Specimen Description BLOOD RIGHT FOREARM    Special Requests      BOTTLES  DRAWN AEROBIC AND ANAEROBIC Blood Culture adequate volume Performed at [facility], [address], [place],  [address]    Culture PENDING    Report Status PENDING   Procalcitonin     Status: None   Collection Time: 12/29/19  3:00 PM  Result Value Ref Range   Procalcitonin 2.80 ng/mL    Comment:        Interpretation: PCT > 2 ng/mL: Systemic infection (sepsis) is likely, unless other causes are known. (NOTE)       Sepsis PCT Algorithm           Lower Respiratory Tract                                      Infection PCT Algorithm    ----------------------------     ----------------------------         PCT < 0.25 ng/mL                PCT < 0.10 ng/mL         Strongly  encourage             Strongly discourage   discontinuation of antibiotics    initiation of antibiotics    ----------------------------     -----------------------------       PCT 0.25 - 0.50 ng/mL            PCT 0.10 - 0.25 ng/mL               OR       >80% decrease in PCT            Discourage initiation of                                            antibiotics      Encourage discontinuation           of antibiotics    ----------------------------     -----------------------------         PCT >= 0.50 ng/mL              PCT 0.26 - 0.50 ng/mL               AND       <80% decrease in PCT              Encourage initiation of                                             antibiotics       Encourage continuation           of antibiotics    ----------------------------     -----------------------------        PCT >= 0.50 ng/mL                  PCT > 0.50 ng/mL               AND         increase in PCT                  Strongly encourage                                      initiation of antibiotics    Strongly encourage escalation           of antibiotics                                     -----------------------------                                           PCT <= 0.25 ng/mL                                                  OR                                        >  80% decrease in PCT                                     Discontinue / Do not initiate                                             antibiotics Performed at [facility], [address], [place], [place] [address]   Lactate dehydrogenase     Status: Abnormal   Collection Time: 12/29/19  3:00 PM  Result Value Ref Range   LDH 245 (H) 98 - 192 U/L    Comment: Performed at [facility], [address] [place], [place] [address]  Ferritin     Status: Abnormal   Collection Time: 12/29/19  3:00 PM  Result Value Ref Range   Ferritin 1,896 (H) 11 - 307 ng/mL    Comment: Performed at [facility], [address] [place], [place] [address]  Triglycerides     Status: None   Collection Time: 12/29/19  3:00 PM  Result Value Ref Range   Triglycerides 64 <150 mg/dL    Comment: Performed at [facility], [address], [place], [place] [address]  Fibrinogen     Status: None   Collection Time: 12/29/19  3:00 PM  Result Value Ref Range   Fibrinogen 451 210 - 475 mg/dL    Comment: Performed at [facility], [address] [place], [place] [address]  C-reactive protein     Status: Abnormal   Collection Time: 12/29/19  3:00 PM  Result Value Ref Range   CRP 1.0 (H) <1.0 mg/dL    Comment: Performed at [facility], [address] [place], [place] [address]  Troponin I (High Sensitivity)     Status: Abnormal   Collection Time: 12/29/19  3:01 PM  Result Value Ref Range   Troponin I (High Sensitivity) 32 (H) <18 ng/L    Comment: (NOTE) Elevated high sensitivity troponin I (hsTnI) values and significant  changes across serial measurements may suggest ACS but many other  chronic and acute conditions are known to elevate hsTnI results.  Refer to the "Links" section for chest pain algorithms and additional  guidance. Performed at [facility], [address], [place], [place] [address]   Blood Culture (routine x 2)     Status: None (Preliminary result)    Collection Time: 12/29/19  3:33 PM   Specimen: Blood  Result Value Ref Range   Specimen Description RIGHT ANTECUBITAL    Special Requests      BOTTLES DRAWN AEROBIC AND ANAEROBIC Blood Culture adequate volume Performed at [facility], [address] [place], [place] [address]    Culture PENDING    Report Status PENDING   HIV Antibody (routine testing w rflx)     Status: None   Collection Time: 12/29/19  3:33 PM  Result Value Ref Range   HIV Screen 4th Generation wRfx NON REACTIVE NON REACTIVE    Comment: Performed at [facility], [place] [address] [place], [place] [address]  ABO/Rh     Status: None   Collection Time: 12/29/19  3:33 PM  Result Value Ref Range   ABO/RH(D)      O POS Performed at [facility], [address] [place],  [address]   Potassium  Status: None   Collection Time: 12/29/19  6:27 PM  Result Value Ref Range   Potassium 4.4 3.5 - 5.1 mmol/L    Comment: DELTA CHECK NOTED Performed at [facility], [address] [place], [place] [address]   CBG monitoring, ED     Status: Abnormal   Collection Time: 12/29/19  9:46 PM  Result Value Ref Range   Glucose-Capillary 160 (H) 70 - 99 mg/dL   Comment 1 Document in Chart    DG Chest Port 1 View  Result Date: 12/29/2019 CLINICAL DATA:  COVID-19 positive. Dialysis patient with short of breath EXAM: PORTABLE CHEST 1 VIEW COMPARISON:  05/14/2019 FINDINGS: Bibasilar airspace disease possible pneumonia given COVID status. Cardiac enlargement.  Negative for edema. IMPRESSION: Bibasilar airspace disease possible pneumonia. Electronically Signed   By: Franchot Gallo M.D.   On: 12/29/2019 13:04    Pending Labs Unresulted Labs (From admission, onward)    Start     Ordered   12/30/19 0500  CBC with Differential/Platelet  Daily,   R     12/29/19 1708   12/30/19 0500  Comprehensive metabolic panel  Daily,   R     12/29/19 1708   12/30/19 0500  C-reactive protein  Daily,   R     12/29/19 1708   12/30/19  0500  D-dimer, quantitative (not at [facility])  Daily,   R     12/29/19 1708   12/30/19 0500  Ferritin  Daily,   R     12/29/19 1708   12/30/19 0500  Magnesium  Daily,   R     12/29/19 1708   12/30/19 0500  Phosphorus  Daily,   R     12/29/19 1708   12/30/19 0500  Hepatitis B surface antigen  Once,   R     12/30/19 0500   12/29/19 2200  MRSA PCR Screening  Once,   R     12/29/19 2200   12/29/19 1840  Hemoglobin A1c  Once,   STAT    Comments: To assess prior glycemic control    12/29/19 1839   12/29/19 1424  SARS CORONAVIRUS 2 (TAT 6-24 HRS) Nasopharyngeal Nasopharyngeal Swab  (Novel Coronavirus, [facility] Order))  Once,   STAT    Question Answer Comment  Is this test for diagnosis or screening Diagnosis of ill patient   Symptomatic for COVID-19 as defined by CDC Yes   Date of Symptom Onset 12/13/2019   Hospitalized for COVID-19 Yes   Admitted to ICU for COVID-19 No   Previously tested for COVID-19 No   Resident in a congregate (group) care setting No   Employed in healthcare setting No   Pregnant No      12/29/19 1424          Vitals/Pain Today's Vitals   12/29/19 2315 12/29/19 2334 12/30/19 0000 12/30/19 0100  BP: 134/68  110/67 119/69  Pulse: 78  79 80  Resp: (!) 25  (!) 26 (!) 27  Temp:      TempSrc:      SpO2: 100%  100% 100%  Weight:      Height:      PainSc:  8       Isolation Precautions Airborne and Contact precautions  Medications Medications  albuterol (VENTOLIN HFA) 108 (90 Base) MCG/ACT inhaler 1 puff (has no administration in time range)  remdesivir 200 mg in sodium chloride 0.9% 250 mL IVPB (0 mg Intravenous Stopped 12/29/19 2138)    Followed by  remdesivir 100 mg in sodium chloride 0.9 % 100 mL IVPB (has no administration in time range)  Chlorhexidine Gluconate Cloth 2 % PADS 6 each (has no administration in time range)  pentafluoroprop-tetrafluoroeth (GEBAUERS) aerosol 1 application (has no administration in time range)  lidocaine (PF)  (XYLOCAINE) 1 % injection 5 mL (has no administration in time range)  lidocaine-prilocaine (EMLA) cream 1 application (has no administration in time range)  0.9 %  sodium chloride infusion (has no administration in time range)  0.9 %  sodium chloride infusion (has no administration in time range)  heparin injection 5,000 Units (5,000 Units Subcutaneous Given 12/29/19 2101)  ascorbic acid (VITAMIN C) tablet 500 mg (500 mg Oral Refused 12/29/19 1859)  zinc sulfate capsule 220 mg (220 mg Oral Refused 12/29/19 1859)  guaiFENesin-dextromethorphan (ROBITUSSIN DM) 100-10 MG/5ML syrup 10 mL (has no administration in time range)  chlorpheniramine-HYDROcodone (TUSSIONEX) 10-8 MG/5ML suspension 5 mL (has no administration in time range)  Ipratropium-Albuterol (COMBIVENT) respimat 1 puff (1 puff Inhalation Given 12/29/19 2105)  acetaminophen (TYLENOL) tablet 650 mg (650 mg Oral Given 12/29/19 2240)  ondansetron (ZOFRAN) tablet 4 mg (has no administration in time range)    Or  ondansetron (ZOFRAN) injection 4 mg (has no administration in time range)  NIFEdipine (PROCARDIA-XL/NIFEDICAL-XL) 24 hr tablet 60 mg (60 mg Oral Given 12/29/19 2102)  rOPINIRole (REQUIP XL) 24 hr tablet 2 mg (2 mg Oral Given 12/29/19 2143)  insulin aspart (novoLOG) injection 0-6 Units (has no administration in time range)  ceFEPIme (MAXIPIME) 2 g in sodium chloride 0.9 % 100 mL IVPB (has no administration in time range)  dexamethasone (DECADRON) injection 6 mg (6 mg Intravenous Given 12/29/19 1259)  ceFEPIme (MAXIPIME) 2 g in sodium chloride 0.9 % 100 mL IVPB (0 g Intravenous Stopped 12/29/19 1756)  vancomycin (VANCOREADY) IVPB 2000 mg/400 mL (0 mg Intravenous Stopped 12/29/19 2124)  nitroGLYCERIN (NITROSTAT) SL tablet 0.4 mg (0.4 mg Sublingual Given 12/29/19 1419)  fentaNYL (SUBLIMAZE) injection 50 mcg (50 mcg Intravenous Given 12/29/19 1419)  dextrose 50 % solution 50 mL (50 mLs Intravenous Given 12/29/19 1520)  insulin aspart (novoLOG)  injection 5 Units (5 Units Intravenous Given 12/29/19 1522)  calcium gluconate 1 g/ 50 mL sodium chloride IVPB (0 g Intravenous Stopped 12/29/19 1654)  sodium bicarbonate injection 25 mEq (25 mEq Intravenous Given 12/29/19 1523)  insulin aspart (novoLOG) injection 15 Units (15 Units Subcutaneous Given 12/29/19 1526)  dexamethasone (DECADRON) injection 4 mg (4 mg Intravenous Given 12/29/19 1528)  sodium zirconium cyclosilicate (LOKELMA) packet 10 g (10 g Oral Given 12/29/19 1531)  ceFEPIme (MAXIPIME) 1 g in sodium chloride 0.9 % 100 mL IVPB (0 g Intravenous Stopped 12/29/19 2312)    Mobility walks     Focused Assessments    R Recommendations: See Admitting Provider Note  Report given to:   Additional Notes:

## 2019-12-30 NOTE — Progress Notes (Addendum)
Pt arrived from [facility] via ambulance. Pt A & O x 4. Pt on 8 liters HFNC O2 at  98%. Vitals signs stable. Telemetry in place. Admission questions and assessment complete. Pt oriented to unit and set up. Pt educated on calling out for assistance with bathroom.Pt verbalized understanding. Pt belongings/call light within reach. Bed alarm on. Will continue to monitor.

## 2019-12-31 LAB — COMPREHENSIVE METABOLIC PANEL
ALT: 25 U/L (ref 0–44)
AST: 16 U/L (ref 15–41)
Albumin: 3.5 g/dL (ref 3.5–5.0)
Alkaline Phosphatase: 66 U/L (ref 38–126)
Anion gap: 18 — ABNORMAL HIGH (ref 5–15)
BUN: 91 mg/dL — ABNORMAL HIGH (ref 6–20)
CO2: 22 mmol/L (ref 22–32)
Calcium: 8.7 mg/dL — ABNORMAL LOW (ref 8.9–10.3)
Chloride: 94 mmol/L — ABNORMAL LOW (ref 98–111)
Creatinine, Ser: 11.87 mg/dL — ABNORMAL HIGH (ref 0.44–1.00)
GFR calc Af Amer: 4 mL/min — ABNORMAL LOW (ref >60)
GFR calc non Af Amer: 3 mL/min — ABNORMAL LOW (ref >60)
Glucose, Bld: 304 mg/dL — ABNORMAL HIGH (ref 70–99)
Potassium: 5.6 mmol/L — ABNORMAL HIGH (ref 3.5–5.1)
Sodium: 134 mmol/L — ABNORMAL LOW (ref 135–145)
Total Bilirubin: 0.8 mg/dL (ref 0.3–1.2)
Total Protein: 6.5 g/dL (ref 6.5–8.1)

## 2019-12-31 LAB — FERRITIN: Ferritin: 1842 ng/mL — ABNORMAL HIGH (ref 11–307)

## 2019-12-31 LAB — GLUCOSE, CAPILLARY
Glucose-Capillary: 186 mg/dL — ABNORMAL HIGH (ref 70–99)
Glucose-Capillary: 143 mg/dL — ABNORMAL HIGH (ref 70–99)
Glucose-Capillary: 129 mg/dL — ABNORMAL HIGH (ref 70–99)

## 2019-12-31 LAB — CBC WITH DIFFERENTIAL/PLATELET
Abs Immature Granulocytes: 0.04 10*3/uL (ref 0.00–0.07)
Basophils Absolute: 0 10*3/uL (ref 0.0–0.1)
Basophils Relative: 0 %
Eosinophils Absolute: 0 10*3/uL (ref 0.0–0.5)
Eosinophils Relative: 0 %
HCT: 23.6 % — ABNORMAL LOW (ref 36.0–46.0)
Hemoglobin: 7.5 g/dL — ABNORMAL LOW (ref 12.0–15.0)
Immature Granulocytes: 1 %
Lymphocytes Relative: 10 %
Lymphs Abs: 0.9 10*3/uL (ref 0.7–4.0)
MCH: 29.6 pg (ref 26.0–34.0)
MCHC: 31.8 g/dL (ref 30.0–36.0)
MCV: 93.3 fL (ref 80.0–100.0)
Monocytes Absolute: 0.3 10*3/uL (ref 0.1–1.0)
Monocytes Relative: 3 %
Neutro Abs: 7.4 10*3/uL (ref 1.7–7.7)
Neutrophils Relative %: 86 %
Platelets: 149 10*3/uL — ABNORMAL LOW (ref 150–400)
RBC: 2.53 MIL/uL — ABNORMAL LOW (ref 3.87–5.11)
RDW: 16 % — ABNORMAL HIGH (ref 11.5–15.5)
WBC: 8.6 10*3/uL (ref 4.0–10.5)
nRBC: 0 % (ref 0.0–0.2)

## 2019-12-31 LAB — D-DIMER, QUANTITATIVE: D-Dimer, Quant: 1 ug/mL-FEU — ABNORMAL HIGH (ref 0.00–0.50)

## 2019-12-31 LAB — MAGNESIUM: Magnesium: 2.1 mg/dL (ref 1.7–2.4)

## 2019-12-31 LAB — HEPATITIS B SURFACE ANTIGEN: Hepatitis B Surface Ag: NONREACTIVE

## 2019-12-31 LAB — C-REACTIVE PROTEIN: CRP: 1.5 mg/dL — ABNORMAL HIGH (ref <1.0)

## 2019-12-31 LAB — PHOSPHORUS: Phosphorus: 8.2 mg/dL — ABNORMAL HIGH (ref 2.5–4.6)

## 2019-12-31 MED ORDER — HEPARIN SODIUM (PORCINE) 1000 UNIT/ML DIALYSIS: 3000.0000 [IU] | INTRAMUSCULAR | Status: DC | PRN

## 2019-12-31 MED ORDER — INSULIN ASPART 100 UNIT/ML ~~LOC~~ SOLN
0.0000 [IU] | Freq: Every day | SUBCUTANEOUS | Status: DC
  Administered 2020-01-01: 22:00:00 3 [IU] via SUBCUTANEOUS

## 2019-12-31 MED ORDER — PLECANATIDE 3 MG PO TABS
3.0000 mg | ORAL_TABLET | Freq: Every day | ORAL | Status: DC
  Administered 2020-01-02: 3 mg via ORAL
  Filled 2019-12-31 (×4): qty 1

## 2019-12-31 MED ORDER — INSULIN ASPART 100 UNIT/ML ~~LOC~~ SOLN
0.0000 [IU] | Freq: Three times a day (TID) | SUBCUTANEOUS | Status: DC
  Administered 2019-12-31: 1 [IU] via SUBCUTANEOUS
  Administered 2019-12-31: 2 [IU] via SUBCUTANEOUS
  Administered 2020-01-01: 18:00:00 3 [IU] via SUBCUTANEOUS
  Administered 2020-01-01: 09:00:00 7 [IU] via SUBCUTANEOUS
  Administered 2020-01-01: 13:00:00 3 [IU] via SUBCUTANEOUS

## 2019-12-31 MED ORDER — INSULIN ASPART 100 UNIT/ML ~~LOC~~ SOLN
3.0000 [IU] | Freq: Three times a day (TID) | SUBCUTANEOUS | Status: DC
  Administered 2020-01-01 – 2020-01-02 (×4): 3 [IU] via SUBCUTANEOUS

## 2019-12-31 MED ORDER — VANCOMYCIN HCL IN DEXTROSE 750-5 MG/150ML-% IV SOLN
INTRAVENOUS | Status: AC
  Administered 2019-12-31: 750 mg via INTRAVENOUS
  Filled 2019-12-31: qty 150

## 2019-12-31 NOTE — Progress Notes (Signed)
Inpatient Diabetes Program Recommendations  AACE/ADA: New Consensus Statement on Inpatient Glycemic Control   Target Ranges:  Prepandial:   less than 140 mg/dL      Peak postprandial:   less than 180 mg/dL (1-2 hours)      Critically ill patients:  140 - 180 mg/dL   Results for Patricia [name], Patricia [name] (MRN [id number]) as of 12/31/2019 10:48  Ref. Range 12/30/2019 08:41 12/30/2019 12:13 12/30/2019 17:45 12/30/2019 22:52 12/31/2019 08:21  Glucose-Capillary Latest Ref Range: 70 - 99 mg/dL 109 (H) 174 (H) 283 (H) 383 (H) 186 (H)  Results for Patricia [name], Patricia [name] (MRN [id number]) as of 12/31/2019 10:48  Ref. Range 12/29/2019 15:33  Hemoglobin A1C Latest Ref Range: 4.8 - 5.6 % 7.3 (H)   Review of Glycemic Control  Diabetes history: DM2 Outpatient Diabetes medications: Glipizide 5 mg BID Current orders for Inpatient glycemic control: Novolog 0-9 units TID with meals, Novolog 0-5 units QHS; Decadron 6 mg Q4H  Inpatient Diabetes Program Recommendations:   Insulin - Meal Coverage: If steroids are continued, please consider ordering Novolog 4 units TID with meals for meal coverage if patient eats at least 50% of meals.  Thanks, Barnie Alderman, RN, MSN, CDE Diabetes Coordinator Inpatient Diabetes Program [phone] (Team Pager from 8am to 5pm)

## 2019-12-31 NOTE — Progress Notes (Addendum)
PROGRESS NOTE   Patricia [name]  DUK:[id number]    DOB: 02-27-1971    DOA: 12/29/2019  PCP: Jake Samples, PA-C   I have briefly reviewed patients previous medical records in [facility].  Chief Complaint:   Chief Complaint  Patient presents with  . Respiratory Distress    Brief Narrative:  49 year old female, PMH of DM 2/IDDM, HTN, ESRD on TTS HD, morbid obesity, OSA, chronic diastolic CHF anemia, GERD, recently hospitalized in [facility] for COVID-19 on 12/27 and has been on home oxygen 2 L/min since, presented to [facility] ED on 1/12 due to progressive dyspnea, cough with chest discomfort, generalized malaise, chills and diaphoresis and found to be in acute respiratory distress, hypoxic requiring 8-10 L HFNC oxygen, chest x-ray with bilateral infiltrates, severe hyperkalemia >7 and mildly elevated inflammatory markers.  Received cocktail for hyperkalemia, initiated treatment for COVID-19 infection and antibiotics for suspected HCAP, nephrology consulted, underwent shortened HD at [facility] and transferred to [facility] for further management.   Assessment & Plan:  Active Problems:   Acute respiratory failure with hypoxia (HCC)   Acute respiratory failure with hypoxia due to suspected bacterial pneumonia complicating recent Covid 19 infection, OSA and possible decompensated CHF.  Patient reports that she was hospitalized at [facility] in [place] from 12/12/2019-12/22/2019, confirmed with COVID-19 on 12/27, reportedly treated with "antibiotics, steroids, vitamin D, zinc, vitamin C" and IV heparin drip (unclear etiology, no report of VTE), discharged on 3 days of Decadron, heparin changed to Eliquis reportedly per "Covid protocol". Nephrologist had issues with Eliquis hence patient did a televisit with her PCP on Friday prior to this admission and apparently no indication for Eliquis found and was discontinued.  Covid test from 12/29/2019 is negative.  Chest  x-ray 1/12: Bibasilar airspace disease possibly pneumonia.  Procalcitonin significantly elevated/2.8.  Inflammatory markers i.e. LDH (245), CRP (1), fibrinogen not significantly elevated.  Ferritin 1896.  Lactate normal.  Continue empirically started IV cefepime and vancomycin.  Blood cultures x2: Negative to date.  Currently on HFNC 4 L/min, saturating at 98%, continue to wean as tolerated.  Volume management across HD.  Patient cannot recollect getting remdesivir at OSH.  I discussed with ID MD on call on 1/14 who recommended keeping patient on isolation for 21 days from recent Covid, continuing remdesivir and steroids.  Improving.  Hyperkalemia  Potassium 7.1 on arrival.  S/p calcium gluconate, bicarbonate, insulin, Lokelma and albuterol.  Also got short and cycle of HD at [facility].  Hyperkalemia had resolved but back up to 5.6 today.  Discussed with Nephrology, plan HD today.  ESRD on TTS HD  Nephrology consulted for dialysis needs.  Next HD on 1/14.  Type II DM with renal complications  Uncontrolled due to steroids.  Holding oral hypoglycemics.  As per DM coordinator input, initiated NovoLog 4 units 3 times daily.  Essential hypertension  Continue Nordell-acting nifedipine.  Reasonably controlled.  Anemia of ESRD  Hemoglobin is dropped to 7.5 in the absence of overt bleeding.  Consider ESA per nephrology.  Follow CBC closely  Thrombocytopenia  Mild and could be related to acute illness/antibiotics versus others.  Follow CBC in a.m.  Restless leg syndrome  Continue Requip.  Constipation/IBS  As per pharmacy input, patient will bring her supply of Trulance and ropinirole XL to resume in the hospital.  Body mass index is 43.39 kg/m.  Morbid obesity  Headache/parotid enlargement/epistaxis  Patient complains of headache, pain in her jaw area  since admission.  Bilateral parotid gland enlargement without acute findings.  Do not suspect acute parotitis at this time.  Supportive  treatment with analgesics and monitor closely.  Also reports some dry nose and some blood on sneezing.  Will add nasal saline spray.  Headache resolved.  Reported having small clots and nosebleed yesterday, none since then.  Monitor closely.   DVT prophylaxis: Subcutaneous heparin> discontinued due to intermittent epistaxis.  SCDs added. Code Status: Full Family Communication: None at bedside Disposition: Patient presented from home, plan will be to discharge home pending clinical improvement i.e. improvement of dyspnea/hypoxia and stable renal functions.   Consultants:   Nephrology  Procedures:   HD  Antimicrobials:   Cefepime and vancomycin   Subjective:  Headache resolved.  Still feels some soreness around her jaws.  Had some nosebleeds yesterday after blowing nose for dry nose, head some blood clots.  None since yesterday.  Breathing she reports is 50% better.  Rest of history as documented above.  Objective:   Vitals:   12/31/19 1128 12/31/19 1133 12/31/19 1139 12/31/19 1140  BP:      Pulse:  84    Resp: (!) 24 20 16 20   Temp:      TempSrc:      SpO2: 93% 100% (!) 87% 99%  Weight:      Height:        General exam: Pleasant young female, moderately built and morbidly obese sitting up comfortably in bed without distress.  Looks improved compared to yesterday. Respiratory system: Clear to auscultation.  No increased work of breathing. Cardiovascular system: S1 & S2 heard, RRR. No JVD, murmurs, rubs, gallops or clicks. No pedal edema.  Not on telemetry. Gastrointestinal system: Abdomen is nondistended, soft and nontender. No organomegaly or masses felt. Normal bowel sounds heard. Central nervous system: Alert and oriented. No focal neurological deficits. Extremities: Symmetric 5 x 5 power. Skin: No rashes, lesions or ulcers Psychiatry: Judgement and insight appear normal. Mood & affect appropriate. ENT: Diffuse enlargement of bilateral parotid glands without acute  findings, unsure if this is chronic for her.  No cervical lymphadenopathy appreciated.  No epistaxis or dried blood at anterior nares.    Data Reviewed:   I have personally reviewed following labs and imaging studies   CBC: Recent Labs  Lab 12/29/19 1306 12/31/19 0251  WBC 10.9* 8.6  NEUTROABS 9.3* 7.4  HGB 8.0* 7.5*  HCT 26.1* 23.6*  MCV 96.3 93.3  PLT 194 149*    Basic Metabolic Panel: Recent Labs  Lab 12/29/19 1306 12/29/19 1827 12/31/19 0251  NA 135  --  134*  K 7.1* 4.4 5.6*  CL 101  --  94*  CO2 18*  --  22  GLUCOSE 320*  --  304*  BUN 96*  --  91*  CREATININE 12.17*  --  11.87*  CALCIUM 9.0  --  8.7*  MG  --   --  2.1  PHOS  --   --  8.2*    Liver Function Tests: Recent Labs  Lab 12/29/19 1306 12/31/19 0251  AST 35 16  ALT 32 25  ALKPHOS 78 66  BILITOT 0.9 0.8  PROT 7.5 6.5  ALBUMIN 3.9 3.5    CBG: Recent Labs  Lab 12/30/19 2252 12/31/19 0821 12/31/19 1206  GLUCAP 383* 186* 143*    Microbiology Studies:   Recent Results (from the past 240 hour(s))  Blood Culture (routine x 2)     Status: None (Preliminary result)  Collection Time: 12/29/19  2:59 PM   Specimen: BLOOD RIGHT FOREARM  Result Value Ref Range Status   Specimen Description BLOOD RIGHT FOREARM  Final   Special Requests   Final    BOTTLES DRAWN AEROBIC AND ANAEROBIC Blood Culture adequate volume   Culture   Final    NO GROWTH 2 DAYS Performed at [facility], [address], [place], [place] [address]    Report Status PENDING  Incomplete  Blood Culture (routine x 2)     Status: None (Preliminary result)   Collection Time: 12/29/19  3:33 PM   Specimen: Right Antecubital; Blood  Result Value Ref Range Status   Specimen Description RIGHT ANTECUBITAL  Final   Special Requests   Final    BOTTLES DRAWN AEROBIC AND ANAEROBIC Blood Culture results may not be optimal due to an inadequate volume of blood received in culture bottles   Culture   Final    NO GROWTH 2  DAYS Performed at [facility], [address], [place], [place] [address]    Report Status PENDING  Incomplete  SARS CORONAVIRUS 2 (TAT 6-24 HRS) Nasopharyngeal Nasopharyngeal Swab     Status: None   Collection Time: 12/29/19  9:10 PM   Specimen: Nasopharyngeal Swab  Result Value Ref Range Status   SARS Coronavirus 2 NEGATIVE NEGATIVE Final    Comment: (NOTE) SARS-CoV-2 target nucleic acids are NOT DETECTED. The SARS-CoV-2 RNA is generally detectable in upper and lower respiratory specimens during the acute phase of infection. Negative results do not preclude SARS-CoV-2 infection, do not rule out co-infections with other pathogens, and should not be used as the sole basis for treatment or other patient management decisions. Negative results must be combined with clinical observations, patient history, and epidemiological information. The expected result is Negative. Fact Sheet for Patients: SugarRoll.be Fact Sheet for Healthcare Providers: https://www.woods-mathews.com/ This test is not yet approved or cleared by the [place] FDA and  has been authorized for detection and/or diagnosis of SARS-CoV-2 by FDA under an Emergency Use Authorization (EUA). This EUA will remain  in effect (meaning this test can be used) for the duration of the COVID-19 declaration under Section 56 4(b)(1) of the Act, 21 U.S.C. section 360bbb-3(b)(1), unless the authorization is terminated or revoked sooner. Performed at [facility], [place] [address] [place], [place] [address]      Radiology Studies:  No results found.   Scheduled Meds:   . vitamin C  500 mg Oral Daily  . Chlorhexidine Gluconate Cloth  6 each Topical Q0600  . Chlorhexidine Gluconate Cloth  6 each Topical Q0600  . cinacalcet  30 mg Oral q morning - 10a  . dexamethasone (DECADRON) injection  6 mg Intravenous Q24H  . insulin aspart  0-5 Units Subcutaneous QHS  . insulin aspart  0-9  Units Subcutaneous TID WC  . ipratropium  2 spray Each Nare BID  . Ipratropium-Albuterol  1 puff Inhalation BID  . multivitamin  1 tablet Oral Daily  . NIFEdipine  60 mg Oral QPM  . pantoprazole  40 mg Oral Daily  . rOPINIRole  2 mg Oral QHS  . zinc sulfate  220 mg Oral Daily    Continuous Infusions:   . sodium chloride    . sodium chloride    . ceFEPime (MAXIPIME) IV    . remdesivir 100 mg in NS 100 mL 100 mg (12/31/19 0846)  . vancomycin       LOS: 2 days  Vernell Leep, MD, [place], [facility]. [facility]    To contact the attending provider between 7A-7P or the covering provider during after hours 7P-7A, please log into the web site [email or web address] and access using universal [place] password for that web site. If you do not have the password, please call the hospital operator.  12/31/2019, 2:05 PM

## 2019-12-31 NOTE — Progress Notes (Signed)
[facility] Progress Note  Subjective: doing well today, talked w/ her HD providers at [facility] they said she can come back to her regular unit next Monday, they don't have a spot until then though  Vitals:   12/31/19 1128 12/31/19 1133 12/31/19 1139 12/31/19 1140  BP:      Pulse:  84    Resp: (!) 24 20 16 20   Temp:      TempSrc:      SpO2: 93% 100% (!) 87% 99%  Weight:      Height:        Exam: Gen alert, no distress No rash, cyanosis or gangrene Sclera anicteric, throat clear  No jvd or bruits Chest clear bilat to bases , occ rhonchi RRR no MRG Abd soft ntnd no mass or ascites +bs obese GU defer MS no joint effusions or deformity Ext no LE or UE edema, no wounds or ulcers Neuro is alert, Ox 3 , nf LUA AVF+bruit    Home meds:  - renvela 2 am+ 3 lunch+ 2 dinner/ auryxia 2 am and 2 w/ dinner  - procardia xl 60 qd/ lasix 80 am and 40 pm  - cinacalcet 30 qd  - glipizide 5 bid/ trulance 3mg  qd  - ropinirole 2 hs/ pantoprazole 40 qd  - prn albuterol/ flovent hfa  - prn's/ vitamins/ supplements   CXR bibasilar opacities 1/12   Outpt HD: [facility] MWF usual HD >> on HD TTS covid shift in [place] at this time   4h 16min   400/600   110.5kg   Hep 1000+ 1800/hr  2/2.5 bath L AVF    Assessment/ Plan: 1. COVID+ infection / PNA - looking better  2. ESRD - HD since 2016, LUA AVF. Was getting TTS OP for covid pts. She will likely be off precautions soon though (12/27 + 21 days= Fri or Sat), will dw/ primary. Had HD here yest off sched and due to HD again today, cont to lower vol as tol.  3. HTN/vol - cont procardia, cont to lower vol as tolerated 4. Acute resp failure w/ hypoxia - seems better after steroids/ HD 5. DM on insulin 6. Anemia ckd - Hb 8.0, get records from [facility] 7. MBD ckd - cont meds 8. Hyperkalemia - severe, resolved        Kelly Splinter 12/31/2019, 2:24 PM  Inpatient medications: . vitamin C  500 mg Oral  Daily  . Chlorhexidine Gluconate Cloth  6 each Topical Q0600  . Chlorhexidine Gluconate Cloth  6 each Topical Q0600  . cinacalcet  30 mg Oral q morning - 10a  . dexamethasone (DECADRON) injection  6 mg Intravenous Q24H  . insulin aspart  0-5 Units Subcutaneous QHS  . insulin aspart  0-9 Units Subcutaneous TID WC  . ipratropium  2 spray Each Nare BID  . Ipratropium-Albuterol  1 puff Inhalation BID  . multivitamin  1 tablet Oral Daily  . NIFEdipine  60 mg Oral QPM  . pantoprazole  40 mg Oral Daily  . rOPINIRole  2 mg Oral QHS  . zinc sulfate  220 mg Oral Daily   . sodium chloride    . sodium chloride    . ceFEPime (MAXIPIME) IV    . remdesivir 100 mg in NS 100 mL 100 mg (12/31/19 0846)  . vancomycin     sodium chloride, sodium chloride, acetaminophen, albuterol, chlorpheniramine-HYDROcodone, guaiFENesin-dextromethorphan, lidocaine (PF), lidocaine-prilocaine, ondansetron **OR** ondansetron (ZOFRAN) IV, pentafluoroprop-tetrafluoroeth, sodium chloride

## 2019-12-31 NOTE — Progress Notes (Addendum)
MEDICATION RELATED CONSULT NOTE - INITIAL   Pharmacy Consult for Review home medications for IBS  Patient is on Trulance (plecanatide) 3mg  daily at home for IBS. Also received linzess 290 mcg capsule #30 on 11/29/19 but this was reportedly discontinued by provider (patient confirmed this). Patient prefers Amitiza but this is not covered by her insurance.  Per last available GI note 11/17/19, plan was: 1. Continue taking Trulance every day 2. Start taking MiraLAX once a day, every day. 3. Can increase MiraLAX to twice a day as needed for better, more complete bowel movements 4. Take Zofran as soon as she starts to feel like she is getting some mild nausea but had all vomiting  Trulance is non-formulary and we do not have it, will need patient's family to bring it in.   Plan: Called patient to confirm information above but no answer. Will continue trying to contact patient to have family bring in [place].  UPDATE @ 11:05am:  Got in touch with patient, she will have someone bring in the Trulance and Ropinerol XL (we have 4mg  XL, she takes 2mg  XL, cannot cut tablet). Instructed her to let RN know when family member will come as Therapist, sports or representative will need to go to to front door to get medication from family.    [name], PharmD, BCPS, BCCP Clinical Pharmacist  Please check AMION for all [place] phone numbers After 10:00 PM, call [place] [phone]

## 2020-01-01 LAB — CBC WITH DIFFERENTIAL/PLATELET
Abs Immature Granulocytes: 0.02 10*3/uL (ref 0.00–0.07)
Basophils Absolute: 0 10*3/uL (ref 0.0–0.1)
Basophils Relative: 0 %
Eosinophils Absolute: 0 10*3/uL (ref 0.0–0.5)
Eosinophils Relative: 0 %
HCT: 24.3 % — ABNORMAL LOW (ref 36.0–46.0)
Hemoglobin: 7.6 g/dL — ABNORMAL LOW (ref 12.0–15.0)
Immature Granulocytes: 0 %
Lymphocytes Relative: 6 %
Lymphs Abs: 0.4 10*3/uL — ABNORMAL LOW (ref 0.7–4.0)
MCH: 28.9 pg (ref 26.0–34.0)
MCHC: 31.3 g/dL (ref 30.0–36.0)
MCV: 92.4 fL (ref 80.0–100.0)
Monocytes Absolute: 0.1 10*3/uL (ref 0.1–1.0)
Monocytes Relative: 1 %
Neutro Abs: 5.6 10*3/uL (ref 1.7–7.7)
Neutrophils Relative %: 93 %
Platelets: 146 10*3/uL — ABNORMAL LOW (ref 150–400)
RBC: 2.63 MIL/uL — ABNORMAL LOW (ref 3.87–5.11)
RDW: 15.8 % — ABNORMAL HIGH (ref 11.5–15.5)
WBC: 6.1 10*3/uL (ref 4.0–10.5)
nRBC: 0 % (ref 0.0–0.2)

## 2020-01-01 LAB — MAGNESIUM: Magnesium: 2.1 mg/dL (ref 1.7–2.4)

## 2020-01-01 LAB — COMPREHENSIVE METABOLIC PANEL
ALT: 29 U/L (ref 0–44)
AST: 21 U/L (ref 15–41)
Albumin: 3.5 g/dL (ref 3.5–5.0)
Alkaline Phosphatase: 67 U/L (ref 38–126)
Anion gap: 17 — ABNORMAL HIGH (ref 5–15)
BUN: 58 mg/dL — ABNORMAL HIGH (ref 6–20)
CO2: 24 mmol/L (ref 22–32)
Calcium: 8.8 mg/dL — ABNORMAL LOW (ref 8.9–10.3)
Chloride: 94 mmol/L — ABNORMAL LOW (ref 98–111)
Creatinine, Ser: 8.61 mg/dL — ABNORMAL HIGH (ref 0.44–1.00)
GFR calc Af Amer: 6 mL/min — ABNORMAL LOW (ref >60)
GFR calc non Af Amer: 5 mL/min — ABNORMAL LOW (ref >60)
Glucose, Bld: 431 mg/dL — ABNORMAL HIGH (ref 70–99)
Potassium: 5.2 mmol/L — ABNORMAL HIGH (ref 3.5–5.1)
Sodium: 135 mmol/L (ref 135–145)
Total Bilirubin: 1.1 mg/dL (ref 0.3–1.2)
Total Protein: 6.8 g/dL (ref 6.5–8.1)

## 2020-01-01 LAB — GLUCOSE, CAPILLARY
Glucose-Capillary: 347 mg/dL — ABNORMAL HIGH (ref 70–99)
Glucose-Capillary: 235 mg/dL — ABNORMAL HIGH (ref 70–99)
Glucose-Capillary: 204 mg/dL — ABNORMAL HIGH (ref 70–99)
Glucose-Capillary: 298 mg/dL — ABNORMAL HIGH (ref 70–99)

## 2020-01-01 LAB — PHOSPHORUS: Phosphorus: 6.6 mg/dL — ABNORMAL HIGH (ref 2.5–4.6)

## 2020-01-01 LAB — FERRITIN: Ferritin: 2016 ng/mL — ABNORMAL HIGH (ref 11–307)

## 2020-01-01 LAB — D-DIMER, QUANTITATIVE: D-Dimer, Quant: 1.1 ug/mL-FEU — ABNORMAL HIGH (ref 0.00–0.50)

## 2020-01-01 LAB — MRSA PCR SCREENING: MRSA by PCR: NEGATIVE

## 2020-01-01 LAB — C-REACTIVE PROTEIN: CRP: 1.3 mg/dL — ABNORMAL HIGH (ref <1.0)

## 2020-01-01 MED ORDER — LORATADINE 10 MG PO TABS
10.0000 mg | ORAL_TABLET | ORAL | Status: DC
  Administered 2020-01-01: 10 mg via ORAL
  Filled 2020-01-01: qty 1

## 2020-01-01 MED ORDER — FERRIC CITRATE 1 GM 210 MG(FE) PO TABS
630.0000 mg | ORAL_TABLET | Freq: Every day | ORAL | Status: DC
  Filled 2020-01-01: qty 3

## 2020-01-01 MED ORDER — FERRIC CITRATE 1 GM 210 MG(FE) PO TABS
420.0000 mg | ORAL_TABLET | Freq: Three times a day (TID) | ORAL | Status: DC
  Administered 2020-01-01 – 2020-01-02 (×3): 420 mg via ORAL
  Filled 2020-01-01 (×3): qty 2

## 2020-01-01 MED ORDER — INSULIN DETEMIR 100 UNIT/ML ~~LOC~~ SOLN
5.0000 [IU] | Freq: Every day | SUBCUTANEOUS | Status: DC
  Administered 2020-01-01: 5 [IU] via SUBCUTANEOUS
  Filled 2020-01-01 (×2): qty 0.05

## 2020-01-01 MED ORDER — SEVELAMER CARBONATE 800 MG PO TABS
800.0000 mg | ORAL_TABLET | Freq: Two times a day (BID) | ORAL | Status: DC
  Administered 2020-01-01 – 2020-01-02 (×2): 800 mg via ORAL
  Filled 2020-01-01 (×2): qty 1

## 2020-01-01 MED ORDER — SODIUM ZIRCONIUM CYCLOSILICATE 10 G PO PACK
10.0000 g | PACK | Freq: Three times a day (TID) | ORAL | Status: AC
  Administered 2020-01-01 (×2): 10 g via ORAL
  Filled 2020-01-01 (×2): qty 1

## 2020-01-01 MED ORDER — WHITE PETROLATUM EX OINT
TOPICAL_OINTMENT | CUTANEOUS | Status: AC
  Filled 2020-01-01: qty 28.35

## 2020-01-01 MED ORDER — CHLORHEXIDINE GLUCONATE CLOTH 2 % EX PADS
6.0000 | MEDICATED_PAD | Freq: Every day | CUTANEOUS | Status: DC
  Administered 2020-01-02: 06:00:00 6 via TOPICAL

## 2020-01-01 NOTE — Progress Notes (Signed)
Patient's home medications, Trulance and Requip, taken to pharmacy.

## 2020-01-01 NOTE — Progress Notes (Addendum)
Inpatient Diabetes Program Recommendations  AACE/ADA: New Consensus Statement on Inpatient Glycemic Control   Target Ranges:  Prepandial:   less than 140 mg/dL      Peak postprandial:   less than 180 mg/dL (1-2 hours)      Critically ill patients:  140 - 180 mg/dL    Review of Glycemic Control Results for Patricia [name], Patricia [name] (MRN [id number]) as of 01/01/2020 12:46  Ref. Range 12/31/2019 08:21 12/31/2019 12:06 12/31/2019 21:36 01/01/2020 08:17 01/01/2020 11:48  Glucose-Capillary Latest Ref Range: 70 - 99 mg/dL 186 (H) 143 (H) 129 (H) 347 (H) 235 (H)   Diabetes history: DM2 Outpatient Diabetes medications: Glipizide 5 mg BID Current orders for Inpatient glycemic control: Novolog 0-9 units TID with meals, Novolog 0-5 units QHS, Novolog 3 units tid meal coverage  Decadron 6 mg Q4H  Inpatient Diabetes Program Recommendations:   Consider Levemir 8-10 units  Thanks, Tama Headings RN, MSN, BC-ADM Inpatient Diabetes Coordinator Team Pager [phone] (8a-5p)

## 2020-01-01 NOTE — Progress Notes (Signed)
[facility] Progress Note  Subjective: no new c/o, on 3- 4 L [place]  Vitals:   01/01/20 1030 01/01/20 1040 01/01/20 1247 01/01/20 1254  BP:   136/66 136/66  Pulse:    88  Resp: 16 16 19  (!) 24  Temp:   98.2 F (36.8 C) 98 F (36.7 C)  TempSrc:   Oral Oral  SpO2: (!) 86% 95% 95% 98%  Weight:      Height:        Exam: Gen alert, no distress No jvd or bruits Chest clear bilat to bases , occ rhonchi RRR no MRG Abd soft ntnd no mass or ascites +bs obese Ext no LE or UE edema Neuro is alert, Ox 3 , nf LUA AVF+bruit    Home meds:  - renvela 2 am+ 3 lunch+ 2 dinner/ auryxia 2 am and 2 w/ dinner  - procardia xl 60 qd/ lasix 80 am and 40 pm  - cinacalcet 30 qd  - glipizide 5 bid/ trulance 3mg  qd  - ropinirole 2 hs/ pantoprazole 40 qd  - prn albuterol/ flovent hfa  - prn's/ vitamins/ supplements   CXR bibasilar opacities 1/12   Outpt HD: [facility] MWF usual HD >> on HD TTS covid shift in [place] at this time   4h 66min   400/600   110.5kg   Hep 1000+ 1800/hr  2/2.5 bath L AVF    Assessment/ Plan: 1. COVID+ infection / PNA - on 3-4 L [place] 2. ESRD - HD since 2016, LUA AVF. Was getting TTS OP for covid pts. She will likely be off precautions soon though (12/27 + 21 days= Fri or Sat). HD tomorrow, max UF as tol.  3. HTN/vol - cont procardia, cont to lower vol as tolerated 4. Acute resp failure w/ hypoxia - seems better after steroids/ HD 5. DM on insulin 6. Anemia ckd - Hb 8.0, get records from [facility] 7. MBD ckd - cont meds 8. Hyperkalemia - severe, resolved        Kelly Splinter 01/01/2020, 2:29 PM  Inpatient medications: . vitamin C  500 mg Oral Daily  . Chlorhexidine Gluconate Cloth  6 each Topical Q0600  . Chlorhexidine Gluconate Cloth  6 each Topical Q0600  . cinacalcet  30 mg Oral q morning - 10a  . dexamethasone (DECADRON) injection  6 mg Intravenous Q24H  . insulin aspart  0-5 Units Subcutaneous QHS  . insulin aspart   0-9 Units Subcutaneous TID WC  . insulin aspart  3 Units Subcutaneous TID WC  . ipratropium  2 spray Each Nare BID  . Ipratropium-Albuterol  1 puff Inhalation BID  . multivitamin  1 tablet Oral Daily  . NIFEdipine  60 mg Oral QPM  . pantoprazole  40 mg Oral Daily  . Plecanatide  3 mg Oral Daily  . rOPINIRole  2 mg Oral QHS  . sevelamer carbonate  800 mg Oral BID AC  . zinc sulfate  220 mg Oral Daily   . sodium chloride    . sodium chloride    . ceFEPime (MAXIPIME) IV 2 g (12/31/19 2146)  . remdesivir 100 mg in NS 100 mL 100 mg (01/01/20 1054)  . vancomycin 750 mg (12/31/19 1740)   sodium chloride, sodium chloride, acetaminophen, albuterol, chlorpheniramine-HYDROcodone, guaiFENesin-dextromethorphan, heparin, lidocaine (PF), lidocaine-prilocaine, ondansetron **OR** ondansetron (ZOFRAN) IV, pentafluoroprop-tetrafluoroeth, sodium chloride

## 2020-01-01 NOTE — Progress Notes (Signed)
Per clinic manager/M. Delia Chimes at [facility], patient will return to her regular MWF seat schedule at her OP HD clinic[facility] at discharge and no longer needs to treat in isolation because it has been more than 3 weeks since her positive COVID test (as of January 17th). Renal Navigator notified inpatient Nephrologist/Dr. Jonnie Finner.  Alphonzo Cruise, [place] Renal Navigator [phone]

## 2020-01-01 NOTE — Progress Notes (Signed)
Pharmacy Antibiotic Note  Patricia [name] is a 49 y.o. female admitted on 12/29/2019 with pneumonia.  Pharmacy has been consulted for Vancomycin and Cefepime dosing. COVID (+) on 12/27. Patient is on dialysis MWF at home, changed to  TTS here.   Discussed stopping vancomycin with MD given quick improvement in O2 requirement (even after first dose of antibiotics). Will repeat MRSA PCR screen (last done 10/2018) before deciding to stop vancomycin.   Plan: Cefepime 2000 mg IV every TTS with HD Vancomycin 1000 mg IV every TTS with HD. Goal trough 15-20 mcg/mL F/u HD schedule, adjust abx if changes  F/u MRSA screen, narrow abx as able  Height: 5\' 3"  (160 cm) Weight: 246 lb 4.1 oz (111.7 kg) IBW/kg (Calculated) : 52.4  Temp (24hrs), Avg:98.1 F (36.7 C), Min:97.6 F (36.4 C), Max:98.6 F (37 C)  Recent Labs  Lab 12/29/19 1306 12/29/19 1458 12/31/19 0251 01/01/20 0237  WBC 10.9*  --  8.6 6.1  CREATININE 12.17*  --  11.87* 8.61*  LATICACIDVEN  --  1.7  --   --     Estimated Creatinine Clearance: 9.4 mL/min (A) (by C-G formula based on SCr of 8.61 mg/dL (H)).     Antimicrobials this admission: Vanco 1/12 >>  Cefepime 1/12 >>   Microbiology results: 1/15 MRSA pend  1/12 BCx: ngtd 1/12 covid NEG 12/27 COVID + 10/29/18 MRSA Neg   Patricia [name], PharmD, BCPS, BCCP Clinical Pharmacist  Please check AMION for all [place] phone numbers After 10:00 PM, call [place] [phone]

## 2020-01-01 NOTE — Progress Notes (Signed)
PROGRESS NOTE   Patricia [name]  XHB:[id number]    DOB: [birth year]    DOA: 12/29/2019  PCP: Jake Samples, PA-C   I have briefly reviewed patients previous medical records in [facility].  Chief Complaint:   Chief Complaint  Patient presents with  . Respiratory Distress    Brief Narrative:  49 year old female, PMH of DM 2/IDDM, HTN, ESRD on TTS HD, morbid obesity, OSA, chronic diastolic CHF anemia, GERD, recently hospitalized in [facility] for COVID-19 on 12/27 and has been on home oxygen 2 L/min since, presented to [facility] ED on 1/12 due to progressive dyspnea, cough with chest discomfort, generalized malaise, chills and diaphoresis and found to be in acute respiratory distress, hypoxic requiring 8-10 L HFNC oxygen, chest x-ray with bilateral infiltrates, severe hyperkalemia >7 and mildly elevated inflammatory markers.  Received cocktail for hyperkalemia, initiated treatment for COVID-19 infection and antibiotics for suspected HCAP, nephrology consulted, underwent shortened HD at [facility] and transferred to [facility] for further management.   Assessment & Plan:  Active Problems:   Acute respiratory failure with hypoxia (HCC)   Acute respiratory failure with hypoxia due to suspected bacterial pneumonia complicating recent Covid 19 infection, OSA and possible decompensated CHF.  Patient reports that she was hospitalized at [facility] in [place] from 12/12/2019-12/22/2019, confirmed with COVID-19 on 12/27, reportedly treated with "antibiotics, steroids, vitamin D, zinc, vitamin C" and IV heparin drip (unclear etiology, no report of VTE), discharged on 3 days of Decadron, heparin changed to Eliquis reportedly per "Covid protocol". Nephrologist had issues with Eliquis hence patient did a televisit with her PCP on Friday prior to this admission and apparently no indication for Eliquis found and was discontinued.  Covid test from 12/29/2019 is negative.  Chest  x-ray 1/12: Bibasilar airspace disease possibly pneumonia.  Procalcitonin significantly elevated/2.8.  Inflammatory markers i.e. LDH (245), CRP (1), fibrinogen not significantly elevated.  Ferritin 1896.  Lactate normal.  Continue empirically started IV cefepime and vancomycin.  Blood cultures x2: Negative to date.  Patient cannot recollect getting remdesivir at OSH.  I discussed with ID MD on call on 1/14 who recommended keeping patient on isolation for 21 days from recent Covid, continuing remdesivir and steroids.  Improving.  Patient down to 4 L/min nasal cannula oxygen.  Dyspnea progressively improving and wants to ambulate.  Hyperkalemia  Potassium 7.1 on arrival.  Has been having intermittent hyperkalemia since.  5.2 today.  Management across HD per nephrology.  Plan for HD 1/16.  ESRD on TTS HD  Nephrology consulted for dialysis needs.  Had dialysis on 1/14, next HD on 1/16.  Type II DM with renal complications  Uncontrolled due to steroids.  Holding oral hypoglycemics.  As per DM coordinator input, initiated NovoLog 4 units 3 times daily.  Consider adding low-dose Levemir.  Essential hypertension  Continue Boese-acting nifedipine.  Reasonably controlled.  Anemia of ESRD  Hemoglobin is dropped to 7.5 in the absence of overt bleeding.  Consider ESA per nephrology.  Follow CBC closely.  Hemoglobin stable at 7.6.  Thrombocytopenia  Mild and could be related to acute illness/antibiotics versus others.  Follow CBC in a.m.  Restless leg syndrome  Continue Requip.  Constipation/IBS  As per pharmacy input, patient will bring her supply of Trulance and ropinirole XL to resume in the hospital.  Body mass index is 43.62 kg/m.  Morbid obesity  Headache/parotid enlargement/epistaxis  Patient complains of headache, pain in her jaw area since admission.  Bilateral  parotid gland enlargement without acute findings.  Do not suspect acute parotitis at this time.  Supportive treatment with  analgesics and monitor closely.  Also reports some dry nose and some blood on sneezing.  Will add nasal saline spray.  Having intermittent headache, nasal stuffiness and intermittent nosebleeds.  Continue saline nasal spray, added Claritin as needed, getting humidified oxygen, changed from high flow to regular.  Monitor    DVT prophylaxis: Subcutaneous heparin> discontinued due to intermittent epistaxis.  SCDs added. Code Status: Full Family Communication: None at bedside Disposition: Patient presented from home, plan will be to discharge home pending clinical improvement i.e. improvement of dyspnea/hypoxia and stable renal functions.   Consultants:   Nephrology  Procedures:   HD  Antimicrobials:   Cefepime and vancomycin   Subjective:  Intermittent headache, 6/10 at times, nasal/facial stuffiness, intermittent nosebleeds mostly on blowing nose.  Wants to get out of bed.  Objective:   Vitals:   01/01/20 1030 01/01/20 1040 01/01/20 1247 01/01/20 1254  BP:   136/66 136/66  Pulse:    88  Resp: 16 16 19  (!) 24  Temp:   98.2 F (36.8 C) 98 F (36.7 C)  TempSrc:   Oral Oral  SpO2: (!) 86% 95% 95% 98%  Weight:      Height:        General exam: Pleasant young female, moderately built and morbidly obese sitting up comfortably in bed without distress.  No distress noted. Respiratory system: Clear to auscultation.  No increased work of breathing. Cardiovascular system: S1 & S2 heard, RRR. No JVD, murmurs, rubs, gallops or clicks. No pedal edema.  Not on telemetry. Gastrointestinal system: Abdomen is nondistended, soft and nontender. No organomegaly or masses felt. Normal bowel sounds heard. Central nervous system: Alert and oriented. No focal neurological deficits. Extremities: Symmetric 5 x 5 power. Skin: No rashes, lesions or ulcers Psychiatry: Judgement and insight appear normal. Mood & affect appropriate. ENT: Diffuse enlargement of bilateral parotid glands without acute  findings, unsure if this is chronic for her.  No cervical lymphadenopathy appreciated.  No epistaxis or dried blood at anterior nares.    Data Reviewed:   I have personally reviewed following labs and imaging studies   CBC: Recent Labs  Lab 12/29/19 1306 12/31/19 0251 01/01/20 0237  WBC 10.9* 8.6 6.1  NEUTROABS 9.3* 7.4 5.6  HGB 8.0* 7.5* 7.6*  HCT 26.1* 23.6* 24.3*  MCV 96.3 93.3 92.4  PLT 194 149* 146*    Basic Metabolic Panel: Recent Labs  Lab 12/29/19 1306 12/29/19 1306 12/29/19 1827 12/31/19 0251 01/01/20 0237  NA 135  --   --  134* 135  K 7.1*   < > 4.4 5.6* 5.2*  CL 101  --   --  94* 94*  CO2 18*  --   --  22 24  GLUCOSE 320*  --   --  304* 431*  BUN 96*  --   --  91* 58*  CREATININE 12.17*  --   --  11.87* 8.61*  CALCIUM 9.0  --   --  8.7* 8.8*  MG  --   --   --  2.1 2.1  PHOS  --   --   --  8.2* 6.6*   < > = values in this interval not displayed.    Liver Function Tests: Recent Labs  Lab 12/29/19 1306 12/31/19 0251 01/01/20 0237  AST 35 16 21  ALT 32 25 29  ALKPHOS 78 66 67  BILITOT 0.9 0.8 1.1  PROT 7.5 6.5 6.8  ALBUMIN 3.9 3.5 3.5    CBG: Recent Labs  Lab 01/01/20 0817 01/01/20 1148 01/01/20 1659  GLUCAP 347* 235* 204*    Microbiology Studies:   Recent Results (from the past 240 hour(s))  Blood Culture (routine x 2)     Status: None (Preliminary result)   Collection Time: 12/29/19  2:59 PM   Specimen: BLOOD RIGHT FOREARM  Result Value Ref Range Status   Specimen Description BLOOD RIGHT FOREARM  Final   Special Requests   Final    BOTTLES DRAWN AEROBIC AND ANAEROBIC Blood Culture adequate volume   Culture   Final    NO GROWTH 3 DAYS Performed at [facility], [address], [place], [place] [address]    Report Status PENDING  Incomplete  Blood Culture (routine x 2)     Status: None (Preliminary result)   Collection Time: 12/29/19  3:33 PM   Specimen: Right Antecubital; Blood  Result Value Ref Range Status   Specimen  Description RIGHT ANTECUBITAL  Final   Special Requests   Final    BOTTLES DRAWN AEROBIC AND ANAEROBIC Blood Culture results may not be optimal due to an inadequate volume of blood received in culture bottles   Culture   Final    NO GROWTH 3 DAYS Performed at [facility], [address], [place], [place] [address]    Report Status PENDING  Incomplete  SARS CORONAVIRUS 2 (TAT 6-24 HRS) Nasopharyngeal Nasopharyngeal Swab     Status: None   Collection Time: 12/29/19  9:10 PM   Specimen: Nasopharyngeal Swab  Result Value Ref Range Status   SARS Coronavirus 2 NEGATIVE NEGATIVE Final    Comment: (NOTE) SARS-CoV-2 target nucleic acids are NOT DETECTED. The SARS-CoV-2 RNA is generally detectable in upper and lower respiratory specimens during the acute phase of infection. Negative results do not preclude SARS-CoV-2 infection, do not rule out co-infections with other pathogens, and should not be used as the sole basis for treatment or other patient management decisions. Negative results must be combined with clinical observations, patient history, and epidemiological information. The expected result is Negative. Fact Sheet for Patients: SugarRoll.be Fact Sheet for Healthcare Providers: https://www.woods-mathews.com/ This test is not yet approved or cleared by the [place] FDA and  has been authorized for detection and/or diagnosis of SARS-CoV-2 by FDA under an Emergency Use Authorization (EUA). This EUA will remain  in effect (meaning this test can be used) for the duration of the COVID-19 declaration under Section 56 4(b)(1) of the Act, 21 U.S.C. section 360bbb-3(b)(1), unless the authorization is terminated or revoked sooner. Performed at [facility], [place] [address] [place], [place] [address]   MRSA PCR Screening     Status: None   Collection Time: 01/01/20 11:37 AM   Specimen: Nasopharyngeal  Result Value Ref Range Status   MRSA  by PCR NEGATIVE NEGATIVE Final    Comment:        The GeneXpert MRSA Assay (FDA approved for NASAL specimens only), is one component of a comprehensive MRSA colonization surveillance program. It is not intended to diagnose MRSA infection nor to guide or monitor treatment for MRSA infections. Performed at [facility], [place] [address], [place], [place] [address]      Radiology Studies:  No results found.   Scheduled Meds:   . vitamin C  500 mg Oral Daily  . Chlorhexidine Gluconate Cloth  6 each Topical Q0600  .  Chlorhexidine Gluconate Cloth  6 each Topical Q0600  . [START ON 01/02/2020] Chlorhexidine Gluconate Cloth  6 each Topical Q0600  . cinacalcet  30 mg Oral q morning - 10a  . dexamethasone (DECADRON) injection  6 mg Intravenous Q24H  . ferric citrate  420 mg Oral TID WC  . [START ON 01/02/2020] ferric citrate  630 mg Oral Q lunch  . insulin aspart  0-5 Units Subcutaneous QHS  . insulin aspart  0-9 Units Subcutaneous TID WC  . insulin aspart  3 Units Subcutaneous TID WC  . ipratropium  2 spray Each Nare BID  . Ipratropium-Albuterol  1 puff Inhalation BID  . loratadine  10 mg Oral Daily  . multivitamin  1 tablet Oral Daily  . NIFEdipine  60 mg Oral QPM  . pantoprazole  40 mg Oral Daily  . Plecanatide  3 mg Oral Daily  . rOPINIRole  2 mg Oral QHS  . sevelamer carbonate  800 mg Oral BID AC  . sodium zirconium cyclosilicate  10 g Oral TID  . zinc sulfate  220 mg Oral Daily    Continuous Infusions:   . sodium chloride    . sodium chloride    . ceFEPime (MAXIPIME) IV 2 g (12/31/19 2146)  . remdesivir 100 mg in NS 100 mL 100 mg (01/01/20 1054)  . vancomycin 750 mg (12/31/19 1740)     LOS: 3 days     Vernell Leep, MD, [place], [facility]. [facility]    To contact the attending provider between 7A-7P or the covering provider during after hours 7P-7A, please log into the web site [email or web address] and access using universal [place] password for that web  site. If you do not have the password, please call the hospital operator.  01/01/2020, 5:57 PM

## 2020-01-02 LAB — COMPREHENSIVE METABOLIC PANEL
ALT: 26 U/L (ref 0–44)
AST: 16 U/L (ref 15–41)
Albumin: 3.6 g/dL (ref 3.5–5.0)
Alkaline Phosphatase: 62 U/L (ref 38–126)
Anion gap: 20 — ABNORMAL HIGH (ref 5–15)
BUN: 78 mg/dL — ABNORMAL HIGH (ref 6–20)
CO2: 24 mmol/L (ref 22–32)
Calcium: 8.9 mg/dL (ref 8.9–10.3)
Chloride: 94 mmol/L — ABNORMAL LOW (ref 98–111)
Creatinine, Ser: 11.75 mg/dL — ABNORMAL HIGH (ref 0.44–1.00)
GFR calc Af Amer: 4 mL/min — ABNORMAL LOW (ref >60)
GFR calc non Af Amer: 3 mL/min — ABNORMAL LOW (ref >60)
Glucose, Bld: 228 mg/dL — ABNORMAL HIGH (ref 70–99)
Potassium: 4.8 mmol/L (ref 3.5–5.1)
Sodium: 138 mmol/L (ref 135–145)
Total Bilirubin: 0.9 mg/dL (ref 0.3–1.2)
Total Protein: 6.5 g/dL (ref 6.5–8.1)

## 2020-01-02 LAB — CBC WITH DIFFERENTIAL/PLATELET
Abs Immature Granulocytes: 0.02 10*3/uL (ref 0.00–0.07)
Basophils Absolute: 0 10*3/uL (ref 0.0–0.1)
Basophils Relative: 0 %
Eosinophils Absolute: 0.1 10*3/uL (ref 0.0–0.5)
Eosinophils Relative: 1 %
HCT: 23.7 % — ABNORMAL LOW (ref 36.0–46.0)
Hemoglobin: 7.5 g/dL — ABNORMAL LOW (ref 12.0–15.0)
Immature Granulocytes: 0 %
Lymphocytes Relative: 24 %
Lymphs Abs: 1.8 10*3/uL (ref 0.7–4.0)
MCH: 29.6 pg (ref 26.0–34.0)
MCHC: 31.6 g/dL (ref 30.0–36.0)
MCV: 93.7 fL (ref 80.0–100.0)
Monocytes Absolute: 0.5 10*3/uL (ref 0.1–1.0)
Monocytes Relative: 7 %
Neutro Abs: 5.2 10*3/uL (ref 1.7–7.7)
Neutrophils Relative %: 68 %
Platelets: 150 10*3/uL (ref 150–400)
RBC: 2.53 MIL/uL — ABNORMAL LOW (ref 3.87–5.11)
RDW: 15.3 % (ref 11.5–15.5)
WBC: 7.6 10*3/uL (ref 4.0–10.5)
nRBC: 0 % (ref 0.0–0.2)

## 2020-01-02 LAB — D-DIMER, QUANTITATIVE: D-Dimer, Quant: 0.76 ug/mL-FEU — ABNORMAL HIGH (ref 0.00–0.50)

## 2020-01-02 LAB — MAGNESIUM: Magnesium: 2.1 mg/dL (ref 1.7–2.4)

## 2020-01-02 LAB — GLUCOSE, CAPILLARY
Glucose-Capillary: 110 mg/dL — ABNORMAL HIGH (ref 70–99)
Glucose-Capillary: 225 mg/dL — ABNORMAL HIGH (ref 70–99)

## 2020-01-02 LAB — C-REACTIVE PROTEIN: CRP: 1.2 mg/dL — ABNORMAL HIGH (ref <1.0)

## 2020-01-02 LAB — PHOSPHORUS: Phosphorus: 9.6 mg/dL — ABNORMAL HIGH (ref 2.5–4.6)

## 2020-01-02 LAB — FERRITIN: Ferritin: 1790 ng/mL — ABNORMAL HIGH (ref 11–307)

## 2020-01-02 MED ORDER — NIFEDIPINE ER 60 MG PO TB24: 60.0000 mg | ORAL_TABLET | Freq: Every evening | ORAL | Status: DC

## 2020-01-02 MED ORDER — HEPARIN SODIUM (PORCINE) 1000 UNIT/ML IJ SOLN
INTRAMUSCULAR | Status: AC
  Filled 2020-01-02: qty 3

## 2020-01-02 MED ORDER — POLYETHYLENE GLYCOL 3350 17 G PO PACK: 17.0000 g | PACK | Freq: Every day | ORAL | Status: DC | PRN

## 2020-01-02 MED ORDER — ZINC SULFATE 220 (50 ZN) MG PO CAPS: 220.0000 mg | ORAL_CAPSULE | Freq: Every day | ORAL | 0 refills | Status: DC

## 2020-01-02 MED ORDER — ASCORBIC ACID 500 MG PO TABS: 500.0000 mg | ORAL_TABLET | Freq: Every day | ORAL | 0 refills | Status: DC

## 2020-01-02 MED ORDER — AMOXICILLIN-POT CLAVULANATE 500-125 MG PO TABS: 1.0000 | ORAL_TABLET | Freq: Every evening | ORAL | 0 refills | Status: AC

## 2020-01-02 MED ORDER — DEXAMETHASONE 6 MG PO TABS: 6.0000 mg | ORAL_TABLET | Freq: Every day | ORAL | 0 refills | Status: AC

## 2020-01-02 MED ORDER — SALINE SPRAY 0.65 % NA SOLN: 1.0000 | NASAL | 0 refills | Status: DC | PRN

## 2020-01-02 NOTE — Progress Notes (Signed)
[facility] Progress Note  Subjective: no new c/o, on 3- 4 L [place]  Vitals:   01/02/20 1115 01/02/20 1130 01/02/20 1200 01/02/20 1231  BP: 97/78 (!) 118/56 (!) 141/42 (!) 165/75  Pulse: 82 78 81   Resp:    15  Temp:   98.1 F (36.7 C) 98.2 F (36.8 C)  TempSrc:    Oral  SpO2: 99%   96%  Weight:      Height:        Exam: Gen alert, no distress No jvd or bruits Chest clear bilat to bases , occ rhonchi RRR no MRG Abd soft ntnd no mass or ascites +bs obese Ext no LE or UE edema Neuro is alert, Ox 3 , nf LUA AVF+bruit    Home meds:  - renvela 2 am+ 3 lunch+ 2 dinner/ auryxia 2 am and 2 w/ dinner  - procardia xl 60 qd/ lasix 80 am and 40 pm  - cinacalcet 30 qd  - glipizide 5 bid/ trulance 3mg  qd  - ropinirole 2 hs/ pantoprazole 40 qd  - prn albuterol/ flovent hfa  - prn's/ vitamins/ supplements   CXR bibasilar opacities 1/12   Outpt HD: [facility]  MWF usual HD >> on HD TTS covid shift in [place] at this time   4h 7min   400/600   110.5kg   Hep 1000+ 1800/hr  2/2.5 bath L AVF    Assessment/ Plan: 1. COVID+ infection / PNA - on 3-4 L [place] 2. ESRD - HD since 2016, LUA AVF. Was getting TTS OP for covid pts. She will likely be off precautions soon though (12/27 + 21 days= Fri or Sat). HD today. If dc'd home today she will resume her OP HD next week w/ her providers in [place].   3. HTN/vol - cont procardia, cont to lower vol as tolerated 4. Acute resp failure w/ hypoxia - on 2L, sending w/ home O2  5. DM on insulin 6. Anemia ckd - Hb 8, transfuse prn 7. MBD ckd - cont meds 8. Hyperkalemia - severe, resolved     Kelly Splinter 01/02/2020, 2:14 PM  Inpatient medications: . vitamin C  500 mg Oral Daily  . Chlorhexidine Gluconate Cloth  6 each Topical Q0600  . Chlorhexidine Gluconate Cloth  6 each Topical Q0600  . Chlorhexidine Gluconate Cloth  6 each Topical Q0600  . cinacalcet  30 mg Oral q morning - 10a  . dexamethasone  (DECADRON) injection  6 mg Intravenous Q24H  . ferric citrate  420 mg Oral TID WC  . ferric citrate  630 mg Oral Q lunch  . heparin      . insulin aspart  0-5 Units Subcutaneous QHS  . insulin aspart  0-9 Units Subcutaneous TID WC  . insulin aspart  3 Units Subcutaneous TID WC  . insulin detemir  5 Units Subcutaneous QHS  . ipratropium  2 spray Each Nare BID  . Ipratropium-Albuterol  1 puff Inhalation BID  . loratadine  10 mg Oral Q48H  . multivitamin  1 tablet Oral Daily  . NIFEdipine  60 mg Oral QPM  . pantoprazole  40 mg Oral Daily  . Plecanatide  3 mg Oral Daily  . rOPINIRole  2 mg Oral QHS  . sevelamer carbonate  800 mg Oral BID AC  . zinc sulfate  220 mg Oral Daily   . sodium chloride    . sodium chloride    . ceFEPime (MAXIPIME) IV 2 g (12/31/19 2146)  .  remdesivir 100 mg in NS 100 mL 100 mg (01/01/20 1054)   sodium chloride, sodium chloride, acetaminophen, albuterol, chlorpheniramine-HYDROcodone, guaiFENesin-dextromethorphan, heparin, lidocaine (PF), lidocaine-prilocaine, ondansetron **OR** ondansetron (ZOFRAN) IV, pentafluoroprop-tetrafluoroeth, sodium chloride

## 2020-01-02 NOTE — Care Management (Signed)
Patient is active with [facility] for home oxygen. She needs portable oxygen for discharge. I notified [facility] and they will bring it to her room prior to DC. No other CM needs identified.

## 2020-01-02 NOTE — Discharge Summary (Signed)
Physician Discharge Summary  Patricia [name] VVO:[id number] DOB: 1971/10/14  PCP: Jake Samples, PA-C  Admitted from: Home Discharged to: Home  Admit date: 12/29/2019 Discharge date: 01/02/2020  Recommendations for Outpatient Follow-up:   Follow-up Information    Jake Samples, PA-C. Schedule an appointment as soon as possible for a visit in 1 week(s).   Specialty: Family Medicine Why: To be seen with repeat labs (CBC & CMP). Contact information: [address] [place] [address] [phone]        Hemodialysis Center Follow up on 01/05/2020.   Why: Continue regular appointments on Tuesdays, Thursdays and Saturdays.           Home Health: None Equipment/Devices: Oxygen via nasal cannula at 2 L/min continuously, has prior home oxygen.  Discharge Condition: Improved and stable CODE STATUS: Full Diet recommendation: Heart healthy & diabetic diet.  Discharge Diagnoses:  Active Problems:   Acute respiratory failure with hypoxia [facility])   Brief Summary: 49 year old female, PMH of DM 2/IDDM, HTN, ESRD on TTS HD, morbid obesity, OSA, chronic diastolic CHF anemia, GERD, recently hospitalized in [facility] for COVID-19 on 12/27 and has been on home oxygen 2 L/min since, presented to [facility] ED on 1/12 due to progressive dyspnea, cough with chest discomfort, generalized malaise, chills and diaphoresis and found to be in acute respiratory distress, hypoxic requiring 8-10 L HFNC oxygen, chest x-ray with bilateral infiltrates, severe hyperkalemia >7 and mildly elevated inflammatory markers.  Received cocktail for hyperkalemia, initiated treatment for COVID-19 infection and antibiotics for suspected HCAP, nephrology consulted, underwent shortened HD at [facility] and transferred to [facility] for further management.   Assessment & Plan:   Acute respiratory failure with hypoxia due to suspected bacterial pneumonia complicating recent Covid 19 infection, OSA and possible  decompensated CHF.  Patient reports that she was hospitalized at [facility] in [place] from 12/12/2019-12/22/2019, confirmed with COVID-19 on 12/27, reportedly treated with "antibiotics, steroids, vitamin D, zinc, vitamin C" and IV heparin drip (unclear etiology, no report of VTE), discharged on 3 days of Decadron, heparin changed to Eliquis reportedly per "Covid protocol". Nephrologist had issues with Eliquis hence patient did a televisit with her PCP on Friday prior to this admission and apparently no indication for Eliquis found and was discontinued.  Covid test from 12/29/2019 is negative.  Chest x-ray 1/12: Bibasilar airspace disease possibly pneumonia.  Procalcitonin significantly elevated/2.8.  Inflammatory markers i.e. LDH (245), CRP (1), fibrinogen not significantly elevated.  Ferritin 1896.  Lactate normal.    Treated empirically with IV cefepime and vancomycin.  Blood cultures x2: Negative to date.  Patient cannot recollect getting remdesivir at OSH.  I discussed with ID MD on call on 1/14 who recommended keeping patient on isolation for 21 days from recent Covid, continuing remdesivir and steroids.    Patient has progressively improved.  Denies dyspnea or cough.  Completed 5 days course of IV remdesivir today.  Has completed 5 days of Decadron, will discharge on additional 5 days to complete total 10-day course.  Also transitioned to oral Augmentin to complete total 7 days antibiotics course.  Remains hypoxic with activity but back to prior level of oxygen 2 L/min at discharge.  Patient advised to complete 21 days of quarantine as per prior instructions which should finish shortly.  Consider repeating chest x-ray in 4 weeks to ensure resolution of pneumonia findings.  Hyperkalemia  Potassium 7.1 on arrival.    Had intermittent hyperkalemia despite HD.  Resolved today.  Follow labs across HD.  ESRD on TTS HD  Nephrology consulted for dialysis needs.  Had  dialysis on 1/14 and 1/16.  Discussed with nephrology, recommend continuing prior home medications including Lasix and cleared for discharge home.  Patient was getting TTS outpatient HD for Covid patients and she can resume her outpatient HD next week with her providers in [place].  Metabolic bone disease  As discussed with nephrology, continue prior home medications and outpatient follow-up.  Type II DM with renal complications  Uncontrolled due to steroids.    Held oral hypoglycemics in the hospital and was treated with insulins.  Glycemic control will be challenging while on steroids but should improve once steroids have stopped.  A1c on 12/29/2019: 7.3.  Continue prior oral hypoglycemics at discharge.  Close outpatient follow-up  Essential hypertension  Continue Khalid-acting nifedipine.  Reasonably controlled.  Anemia of ESRD  Hemoglobin is dropped to 7.5 in the absence of overt bleeding.  Consider ESA per nephrology.  Follow CBC closely.  Hemoglobin stable at mid 7 g range.  Follow CBC across dialysis and transfuse for hemoglobin 7 g or less.  Thrombocytopenia  Mild and could be related to acute illness/antibiotics versus others.    Resolved.  Restless leg syndrome  Continue Requip.  Constipation/IBS  Continue prior home regimen.  Body mass index is 43.62 kg/m.  Morbid obesity  Headache/parotid enlargement/epistaxis  Patient complained of intermittent headache, pain in her jaw area early on in admission.  Bilateral parotid gland enlargement without acute findings.  Do not suspect acute parotitis at this time.  Supportive treatment with analgesics and monitor closely.  Also reports some dry nose and some blood on sneezing.  Will add nasal saline spray.  Having intermittent headache, nasal stuffiness and intermittent nosebleeds.  Continue saline nasal spray, and home Flovent and Atrovent.  Offered to get a CT head although yield is likely to be low but patient declined.   She uses as needed Tylenol at home.  Close outpatient follow-up with PCP.  May consider outpatient further evaluation for parotid enlargement.  Epistaxis has resolved.  May consider outpatient ENT consultation.   Consultants:   Nephrology  Procedures:   HD    Discharge Instructions  Discharge Instructions    (HEART FAILURE PATIENTS) Call MD:  Anytime you have any of the following symptoms: 1) 3 pound weight gain in 24 hours or 5 pounds in 1 week 2) shortness of breath, with or without a dry hacking cough 3) swelling in the hands, feet or stomach 4) if you have to sleep on extra pillows at night in order to breathe.   Complete by: As directed    Call MD for:   Complete by: As directed    Recurrent nosebleeds.   Call MD for:  difficulty breathing, headache or visual disturbances   Complete by: As directed    Call MD for:  extreme fatigue   Complete by: As directed    Call MD for:  persistant dizziness or light-headedness   Complete by: As directed    Call MD for:  persistant nausea and vomiting   Complete by: As directed    Call MD for:  severe uncontrolled pain   Complete by: As directed    Call MD for:  temperature >100.4   Complete by: As directed    Diet - low sodium heart healthy   Complete by: As directed    Diet Carb Modified   Complete by: As directed    Increase  activity slowly   Complete by: As directed        Medication List    TAKE these medications   albuterol (2.5 MG/3ML) 0.083% nebulizer solution Commonly known as: PROVENTIL Take 3 mLs (2.5 mg total) by nebulization every 6 (six) hours as needed for wheezing or shortness of breath.   albuterol 108 (90 Base) MCG/ACT inhaler Commonly known as: VENTOLIN HFA Inhale 2 puffs into the lungs every 6 (six) hours as needed for wheezing or shortness of breath.   amoxicillin-clavulanate 500-125 MG tablet Commonly known as: Augmentin Take 1 tablet (500 mg total) by mouth every evening for 3 days.   ascorbic  acid 500 MG tablet Commonly known as: VITAMIN C Take 1 tablet (500 mg total) by mouth daily. Start taking on: January 03, 2020   Auryxia 1 GM 210 MG(Fe) tablet Generic drug: ferric citrate Take 2 tablets by mouth 2 (two) times daily with a meal. With Breakfast & with supper   cinacalcet 30 MG tablet Commonly known as: SENSIPAR Take 30 mg by mouth every morning.   dexamethasone 6 MG tablet Commonly known as: Decadron Take 1 tablet (6 mg total) by mouth daily for 5 days. Start taking on: January 03, 2020   dicyclomine 10 MG capsule Commonly known as: BENTYL TAKE 1 CAPSULE (10 MG TOTAL) BY MOUTH 2 (TWO) TIMES DAILY AS NEEDED FOR SPASMS.   Flovent HFA 110 MCG/ACT inhaler Generic drug: fluticasone 1 puff 2 (two) times daily.   furosemide 40 MG tablet Commonly known as: LASIX Take 40-80 mg by mouth 2 (two) times daily. Take 2 tablets (80mg ) in the morning and take 1 tablet (40mg ) at bedtime   glipiZIDE 5 MG tablet Commonly known as: GLUCOTROL Take 5 mg by mouth 2 (two) times daily as needed. For blood sugar levels over 150   ipratropium 0.03 % nasal spray Commonly known as: ATROVENT Place 2 sprays into both nostrils 2 (two) times daily.   multivitamin Tabs tablet Take 1 tablet by mouth daily.   NIFEdipine 60 MG 24 hr tablet Commonly known as: ADALAT CC Take 1 tablet (60 mg total) by mouth every evening.   ondansetron 4 MG disintegrating tablet Commonly known as: ZOFRAN-ODT Take 4 mg by mouth every 8 (eight) hours as needed for nausea or vomiting.   pantoprazole 40 MG tablet Commonly known as: PROTONIX Take 40 mg by mouth daily.   polyethylene glycol 17 g packet Commonly known as: MiraLax Take 17 g by mouth daily as needed for mild constipation.   Renvela 800 MG tablet Generic drug: sevelamer carbonate Take 800 mg by mouth See admin instructions. Take 2 tablets (1600 mg) in the morning, take 3 tablets (2400 mg) with lunch or snack,  and take 2 tablets (1600 mg) by  mouth in the evening with dinner meal.   rOPINIRole 2 MG 24 hr tablet Commonly known as: REQUIP XL Take 2 mg by mouth at bedtime.   sodium chloride 0.65 % Soln nasal spray Commonly known as: OCEAN Place 1 spray into both nostrils as needed for congestion.   Trulance 3 MG Tabs Generic drug: Plecanatide TAKE 1 TABLET BY MOUTH EVERY DAY What changed: how much to take   zinc sulfate 220 (50 Zn) MG capsule Take 1 capsule (220 mg total) by mouth daily. Start taking on: January 03, 2020      Allergies  Allergen Reactions  . Amlodipine Besylate Rash and Other (See Comments)    dizziness  . Reglan [Metoclopramide] Other (See  Comments)    hallucinations       Procedures/Studies: DG Chest Port 1 View  Result Date: 12/29/2019 CLINICAL DATA:  COVID-19 positive. Dialysis patient with short of breath EXAM: PORTABLE CHEST 1 VIEW COMPARISON:  05/14/2019 FINDINGS: Bibasilar airspace disease possible pneumonia given COVID status. Cardiac enlargement.  Negative for edema. IMPRESSION: Bibasilar airspace disease possible pneumonia. Electronically Signed   By: Franchot Gallo M.D.   On: 12/29/2019 13:04      Subjective: Patient seen this morning at dialysis.  Intermittent mild frontal headache without blurred vision, double vision, nausea, vomiting or any strokelike symptoms.  No chest pain or dyspnea reported.  Tolerating diet.  Discharge Exam:  Vitals:   01/02/20 1115 01/02/20 1130 01/02/20 1200 01/02/20 1231  BP: 97/78 (!) 118/56 (!) 141/42 (!) 165/75  Pulse: 82 78 81   Resp:    15  Temp:   98.1 F (36.7 C) 98.2 F (36.8 C)  TempSrc:    Oral  SpO2: 99%   96%  Weight:      Height:        General exam: Pleasant young female, moderately built and morbidly obese lying comfortably supine in bed undergoing HD. Respiratory system: Clear to auscultation.  No increased work of breathing. Cardiovascular system: S1 & S2 heard, RRR. No JVD, murmurs, rubs, gallops or clicks. No pedal  edema.   Gastrointestinal system: Abdomen is nondistended, soft and nontender. No organomegaly or masses felt. Normal bowel sounds heard. Central nervous system: Alert and oriented. No focal neurological deficits. Extremities: Symmetric 5 x 5 power. Skin: No rashes, lesions or ulcers Psychiatry: Judgement and insight appear normal. Mood & affect appropriate. ENT: Diffuse enlargement of bilateral parotid glands without acute findings, unsure if this is chronic for her.  No cervical lymphadenopathy appreciated.  No epistaxis or dried blood at anterior nares.   The results of significant diagnostics from this hospitalization (including imaging, microbiology, ancillary and laboratory) are listed below for reference.     Microbiology: Recent Results (from the past 240 hour(s))  Blood Culture (routine x 2)     Status: None (Preliminary result)   Collection Time: 12/29/19  2:59 PM   Specimen: BLOOD RIGHT FOREARM  Result Value Ref Range Status   Specimen Description BLOOD RIGHT FOREARM  Final   Special Requests   Final    BOTTLES DRAWN AEROBIC AND ANAEROBIC Blood Culture adequate volume   Culture   Final    NO GROWTH 4 DAYS Performed at [facility], [address] [place], [place] [address]    Report Status PENDING  Incomplete  Blood Culture (routine x 2)     Status: None (Preliminary result)   Collection Time: 12/29/19  3:33 PM   Specimen: Right Antecubital; Blood  Result Value Ref Range Status   Specimen Description RIGHT ANTECUBITAL  Final   Special Requests   Final    BOTTLES DRAWN AEROBIC AND ANAEROBIC Blood Culture results may not be optimal due to an inadequate volume of blood received in culture bottles   Culture   Final    NO GROWTH 4 DAYS Performed at [facility], [address] [place], [place] [address]    Report Status PENDING  Incomplete  SARS CORONAVIRUS 2 (TAT 6-24 HRS) Nasopharyngeal Nasopharyngeal Swab     Status: None   Collection Time: 12/29/19  9:10 PM    Specimen: Nasopharyngeal Swab  Result Value Ref Range Status   SARS Coronavirus 2 NEGATIVE NEGATIVE Final    Comment: (NOTE) SARS-CoV-2  target nucleic acids are NOT DETECTED. The SARS-CoV-2 RNA is generally detectable in upper and lower respiratory specimens during the acute phase of infection. Negative results do not preclude SARS-CoV-2 infection, do not rule out co-infections with other pathogens, and should not be used as the sole basis for treatment or other patient management decisions. Negative results must be combined with clinical observations, patient history, and epidemiological information. The expected result is Negative. Fact Sheet for Patients: SugarRoll.be Fact Sheet for Healthcare Providers: https://www.woods-mathews.com/ This test is not yet approved or cleared by the [place] FDA and  has been authorized for detection and/or diagnosis of SARS-CoV-2 by FDA under an Emergency Use Authorization (EUA). This EUA will remain  in effect (meaning this test can be used) for the duration of the COVID-19 declaration under Section 56 4(b)(1) of the Act, 21 U.S.C. section 360bbb-3(b)(1), unless the authorization is terminated or revoked sooner. Performed at [facility], [place] [address], [place], [place] [address]   MRSA PCR Screening     Status: None   Collection Time: 01/01/20 11:37 AM   Specimen: Nasopharyngeal  Result Value Ref Range Status   MRSA by PCR NEGATIVE NEGATIVE Final    Comment:        The GeneXpert MRSA Assay (FDA approved for NASAL specimens only), is one component of a comprehensive MRSA colonization surveillance program. It is not intended to diagnose MRSA infection nor to guide or monitor treatment for MRSA infections. Performed at [facility], [place] [address] [place], [place] [address]      Labs: CBC: Recent Labs  Lab 12/29/19 1306 12/31/19 0251 01/01/20 0237 01/02/20 0547  WBC  10.9* 8.6 6.1 7.6  NEUTROABS 9.3* 7.4 5.6 5.2  HGB 8.0* 7.5* 7.6* 7.5*  HCT 26.1* 23.6* 24.3* 23.7*  MCV 96.3 93.3 92.4 93.7  PLT 194 149* 146* 229    Basic Metabolic Panel: Recent Labs  Lab 12/29/19 1306 12/29/19 1827 12/31/19 0251 01/01/20 0237 01/02/20 0547  NA 135  --  134* 135 138  K 7.1* 4.4 5.6* 5.2* 4.8  CL 101  --  94* 94* 94*  CO2 18*  --  22 24 24   GLUCOSE 320*  --  304* 431* 228*  BUN 96*  --  91* 58* 78*  CREATININE 12.17*  --  11.87* 8.61* 11.75*  CALCIUM 9.0  --  8.7* 8.8* 8.9  MG  --   --  2.1 2.1 2.1  PHOS  --   --  8.2* 6.6* 9.6*    Liver Function Tests: Recent Labs  Lab 12/29/19 1306 12/31/19 0251 01/01/20 0237 01/02/20 0547  AST 35 16 21 16   ALT 32 25 29 26   ALKPHOS 78 66 67 62  BILITOT 0.9 0.8 1.1 0.9  PROT 7.5 6.5 6.8 6.5  ALBUMIN 3.9 3.5 3.5 3.6    CBG: Recent Labs  Lab 01/01/20 1148 01/01/20 1659 01/01/20 2146 01/02/20 1232 01/02/20 1632  GLUCAP 235* 204* 298* 110* 225*    Anemia work up Recent Labs    01/01/20 0237 01/02/20 0547  FERRITIN 2,016* 1,790*   Unsuccessfully attempted to reach patient's mother via phone, left VM message.   Time coordinating discharge: 45 minutes  SIGNED:  Vernell Leep, MD, [facility]renas, [facility]. [facility]  To contact the attending provider between 7A-7P or the covering provider during after hours 7P-7A, please log into the web site [email or web address] and access using universal [place] password for that web site. If you do not have  the password, please call the hospital operator.

## 2020-01-02 NOTE — Progress Notes (Signed)
SATURATION QUALIFICATIONS: (This note is used to comply with regulatory documentation for home oxygen)  Patient Saturations on Room Air at Rest = 89%  Patient Saturations on Room Air while Ambulating = 86%  Patient Saturations on 2 Liters of oxygen while Ambulating = 95%  Please briefly explain why patient needs home oxygen:  Patient desaturates as soon as she is off oxygen.  She desatts more during exertion.  Seems to tolerate walking well with 2L of oxygen on.

## 2020-01-02 NOTE — Discharge Instructions (Addendum)

## 2020-01-04 DIAGNOSIS — Z1159 Encounter for screening for other viral diseases: Secondary | ICD-10-CM | POA: Diagnosis not present

## 2020-01-04 DIAGNOSIS — Z992 Dependence on renal dialysis: Secondary | ICD-10-CM | POA: Diagnosis not present

## 2020-01-04 DIAGNOSIS — N186 End stage renal disease: Secondary | ICD-10-CM | POA: Diagnosis not present

## 2020-01-04 DIAGNOSIS — D509 Iron deficiency anemia, unspecified: Secondary | ICD-10-CM | POA: Diagnosis not present

## 2020-01-04 DIAGNOSIS — D631 Anemia in chronic kidney disease: Secondary | ICD-10-CM | POA: Diagnosis not present

## 2020-01-04 DIAGNOSIS — N2581 Secondary hyperparathyroidism of renal origin: Secondary | ICD-10-CM | POA: Diagnosis not present

## 2020-01-04 LAB — CULTURE, BLOOD (ROUTINE X 2)
Culture: NO GROWTH
Culture: NO GROWTH
Special Requests: ADEQUATE

## 2020-01-06 DIAGNOSIS — Z992 Dependence on renal dialysis: Secondary | ICD-10-CM | POA: Diagnosis not present

## 2020-01-06 DIAGNOSIS — D509 Iron deficiency anemia, unspecified: Secondary | ICD-10-CM | POA: Diagnosis not present

## 2020-01-06 DIAGNOSIS — N186 End stage renal disease: Secondary | ICD-10-CM | POA: Diagnosis not present

## 2020-01-06 DIAGNOSIS — N2581 Secondary hyperparathyroidism of renal origin: Secondary | ICD-10-CM | POA: Diagnosis not present

## 2020-01-06 DIAGNOSIS — D631 Anemia in chronic kidney disease: Secondary | ICD-10-CM | POA: Diagnosis not present

## 2020-01-08 DIAGNOSIS — D509 Iron deficiency anemia, unspecified: Secondary | ICD-10-CM | POA: Diagnosis not present

## 2020-01-08 DIAGNOSIS — Z992 Dependence on renal dialysis: Secondary | ICD-10-CM | POA: Diagnosis not present

## 2020-01-08 DIAGNOSIS — N186 End stage renal disease: Secondary | ICD-10-CM | POA: Diagnosis not present

## 2020-01-08 DIAGNOSIS — N2581 Secondary hyperparathyroidism of renal origin: Secondary | ICD-10-CM | POA: Diagnosis not present

## 2020-01-08 DIAGNOSIS — D631 Anemia in chronic kidney disease: Secondary | ICD-10-CM | POA: Diagnosis not present

## 2020-01-10 ENCOUNTER — Other Ambulatory Visit: Payer: Self-pay

## 2020-01-10 ENCOUNTER — Inpatient Hospital Stay (HOSPITAL_COMMUNITY)
Admission: EM | Admit: 2020-01-10 | Discharge: 2020-01-15 | DRG: 291 | Disposition: A | Discharge status: Home / Self Care | Payer: Medicare Other | Attending: Internal Medicine | Admitting: Internal Medicine

## 2020-01-10 ENCOUNTER — Encounter (HOSPITAL_COMMUNITY): Payer: Self-pay | Admitting: *Deleted

## 2020-01-10 DIAGNOSIS — I5032 Chronic diastolic (congestive) heart failure: Secondary | ICD-10-CM | POA: Diagnosis not present

## 2020-01-10 DIAGNOSIS — Z9012 Acquired absence of left breast and nipple: Secondary | ICD-10-CM

## 2020-01-10 DIAGNOSIS — I10 Essential (primary) hypertension: Secondary | ICD-10-CM | POA: Diagnosis present

## 2020-01-10 DIAGNOSIS — E1122 Type 2 diabetes mellitus with diabetic chronic kidney disease: Secondary | ICD-10-CM

## 2020-01-10 DIAGNOSIS — Z992 Dependence on renal dialysis: Secondary | ICD-10-CM

## 2020-01-10 DIAGNOSIS — Z8249 Family history of ischemic heart disease and other diseases of the circulatory system: Secondary | ICD-10-CM

## 2020-01-10 DIAGNOSIS — J810 Acute pulmonary edema: Secondary | ICD-10-CM

## 2020-01-10 DIAGNOSIS — D631 Anemia in chronic kidney disease: Secondary | ICD-10-CM | POA: Diagnosis present

## 2020-01-10 DIAGNOSIS — J9621 Acute and chronic respiratory failure with hypoxia: Secondary | ICD-10-CM | POA: Diagnosis not present

## 2020-01-10 DIAGNOSIS — M1990 Unspecified osteoarthritis, unspecified site: Secondary | ICD-10-CM | POA: Diagnosis present

## 2020-01-10 DIAGNOSIS — Z853 Personal history of malignant neoplasm of breast: Secondary | ICD-10-CM

## 2020-01-10 DIAGNOSIS — Z9049 Acquired absence of other specified parts of digestive tract: Secondary | ICD-10-CM

## 2020-01-10 DIAGNOSIS — N186 End stage renal disease: Secondary | ICD-10-CM

## 2020-01-10 DIAGNOSIS — R0602 Shortness of breath: Secondary | ICD-10-CM | POA: Diagnosis not present

## 2020-01-10 DIAGNOSIS — Z7984 Long term (current) use of oral hypoglycemic drugs: Secondary | ICD-10-CM

## 2020-01-10 DIAGNOSIS — Z79899 Other long term (current) drug therapy: Secondary | ICD-10-CM

## 2020-01-10 DIAGNOSIS — R110 Nausea: Secondary | ICD-10-CM

## 2020-01-10 DIAGNOSIS — J9601 Acute respiratory failure with hypoxia: Secondary | ICD-10-CM

## 2020-01-10 DIAGNOSIS — U071 COVID-19: Secondary | ICD-10-CM | POA: Diagnosis not present

## 2020-01-10 DIAGNOSIS — Z8701 Personal history of pneumonia (recurrent): Secondary | ICD-10-CM

## 2020-01-10 DIAGNOSIS — I132 Hypertensive heart and chronic kidney disease with heart failure and with stage 5 chronic kidney disease, or end stage renal disease: Secondary | ICD-10-CM | POA: Diagnosis not present

## 2020-01-10 DIAGNOSIS — J449 Chronic obstructive pulmonary disease, unspecified: Secondary | ICD-10-CM | POA: Diagnosis present

## 2020-01-10 DIAGNOSIS — D649 Anemia, unspecified: Secondary | ICD-10-CM | POA: Diagnosis present

## 2020-01-10 DIAGNOSIS — Z6841 Body Mass Index (BMI) 40.0 and over, adult: Secondary | ICD-10-CM

## 2020-01-10 DIAGNOSIS — R7989 Other specified abnormal findings of blood chemistry: Secondary | ICD-10-CM | POA: Diagnosis present

## 2020-01-10 DIAGNOSIS — R778 Other specified abnormalities of plasma proteins: Secondary | ICD-10-CM | POA: Diagnosis present

## 2020-01-10 DIAGNOSIS — E872 Acidosis, unspecified: Secondary | ICD-10-CM | POA: Diagnosis present

## 2020-01-10 DIAGNOSIS — E8770 Fluid overload, unspecified: Secondary | ICD-10-CM | POA: Diagnosis present

## 2020-01-10 DIAGNOSIS — R040 Epistaxis: Secondary | ICD-10-CM | POA: Diagnosis not present

## 2020-01-10 DIAGNOSIS — G4733 Obstructive sleep apnea (adult) (pediatric): Secondary | ICD-10-CM | POA: Diagnosis present

## 2020-01-10 DIAGNOSIS — I248 Other forms of acute ischemic heart disease: Secondary | ICD-10-CM | POA: Diagnosis present

## 2020-01-10 DIAGNOSIS — R630 Anorexia: Secondary | ICD-10-CM | POA: Diagnosis present

## 2020-01-10 DIAGNOSIS — E119 Type 2 diabetes mellitus without complications: Secondary | ICD-10-CM

## 2020-01-10 DIAGNOSIS — Z833 Family history of diabetes mellitus: Secondary | ICD-10-CM

## 2020-01-10 DIAGNOSIS — I160 Hypertensive urgency: Secondary | ICD-10-CM | POA: Diagnosis present

## 2020-01-10 DIAGNOSIS — K5900 Constipation, unspecified: Secondary | ICD-10-CM | POA: Diagnosis not present

## 2020-01-10 DIAGNOSIS — K219 Gastro-esophageal reflux disease without esophagitis: Secondary | ICD-10-CM | POA: Diagnosis present

## 2020-01-10 NOTE — ED Triage Notes (Signed)
Pt c/o sob that increased tonight, difficult to obtain information from due to pt being sob, pt also c/o mid center chest pain

## 2020-01-11 ENCOUNTER — Encounter (HOSPITAL_COMMUNITY): Payer: Self-pay | Admitting: Internal Medicine

## 2020-01-11 ENCOUNTER — Emergency Department (HOSPITAL_COMMUNITY): Payer: Medicare Other

## 2020-01-11 DIAGNOSIS — Z992 Dependence on renal dialysis: Secondary | ICD-10-CM

## 2020-01-11 DIAGNOSIS — M1990 Unspecified osteoarthritis, unspecified site: Secondary | ICD-10-CM | POA: Diagnosis present

## 2020-01-11 DIAGNOSIS — U071 COVID-19: Secondary | ICD-10-CM | POA: Diagnosis not present

## 2020-01-11 DIAGNOSIS — I10 Essential (primary) hypertension: Secondary | ICD-10-CM

## 2020-01-11 DIAGNOSIS — I5032 Chronic diastolic (congestive) heart failure: Secondary | ICD-10-CM | POA: Diagnosis present

## 2020-01-11 DIAGNOSIS — I160 Hypertensive urgency: Secondary | ICD-10-CM | POA: Diagnosis present

## 2020-01-11 DIAGNOSIS — R0602 Shortness of breath: Secondary | ICD-10-CM

## 2020-01-11 DIAGNOSIS — Z833 Family history of diabetes mellitus: Secondary | ICD-10-CM | POA: Diagnosis not present

## 2020-01-11 DIAGNOSIS — I132 Hypertensive heart and chronic kidney disease with heart failure and with stage 5 chronic kidney disease, or end stage renal disease: Secondary | ICD-10-CM | POA: Diagnosis present

## 2020-01-11 DIAGNOSIS — Z9012 Acquired absence of left breast and nipple: Secondary | ICD-10-CM | POA: Diagnosis not present

## 2020-01-11 DIAGNOSIS — Z794 Long term (current) use of insulin: Secondary | ICD-10-CM

## 2020-01-11 DIAGNOSIS — R630 Anorexia: Secondary | ICD-10-CM | POA: Diagnosis present

## 2020-01-11 DIAGNOSIS — N186 End stage renal disease: Secondary | ICD-10-CM | POA: Diagnosis not present

## 2020-01-11 DIAGNOSIS — R040 Epistaxis: Secondary | ICD-10-CM | POA: Diagnosis not present

## 2020-01-11 DIAGNOSIS — E8770 Fluid overload, unspecified: Secondary | ICD-10-CM | POA: Diagnosis not present

## 2020-01-11 DIAGNOSIS — G4733 Obstructive sleep apnea (adult) (pediatric): Secondary | ICD-10-CM | POA: Diagnosis not present

## 2020-01-11 DIAGNOSIS — K219 Gastro-esophageal reflux disease without esophagitis: Secondary | ICD-10-CM

## 2020-01-11 DIAGNOSIS — J9601 Acute respiratory failure with hypoxia: Secondary | ICD-10-CM | POA: Diagnosis not present

## 2020-01-11 DIAGNOSIS — J449 Chronic obstructive pulmonary disease, unspecified: Secondary | ICD-10-CM | POA: Diagnosis present

## 2020-01-11 DIAGNOSIS — D631 Anemia in chronic kidney disease: Secondary | ICD-10-CM

## 2020-01-11 DIAGNOSIS — E1122 Type 2 diabetes mellitus with diabetic chronic kidney disease: Secondary | ICD-10-CM

## 2020-01-11 DIAGNOSIS — E872 Acidosis: Secondary | ICD-10-CM | POA: Diagnosis present

## 2020-01-11 DIAGNOSIS — E8779 Other fluid overload: Secondary | ICD-10-CM | POA: Diagnosis not present

## 2020-01-11 DIAGNOSIS — Z6841 Body Mass Index (BMI) 40.0 and over, adult: Secondary | ICD-10-CM | POA: Diagnosis not present

## 2020-01-11 DIAGNOSIS — I120 Hypertensive chronic kidney disease with stage 5 chronic kidney disease or end stage renal disease: Secondary | ICD-10-CM | POA: Diagnosis not present

## 2020-01-11 DIAGNOSIS — K5900 Constipation, unspecified: Secondary | ICD-10-CM | POA: Diagnosis not present

## 2020-01-11 DIAGNOSIS — J810 Acute pulmonary edema: Secondary | ICD-10-CM | POA: Diagnosis present

## 2020-01-11 DIAGNOSIS — J9621 Acute and chronic respiratory failure with hypoxia: Secondary | ICD-10-CM | POA: Diagnosis present

## 2020-01-11 DIAGNOSIS — Z853 Personal history of malignant neoplasm of breast: Secondary | ICD-10-CM | POA: Diagnosis not present

## 2020-01-11 DIAGNOSIS — R110 Nausea: Secondary | ICD-10-CM | POA: Diagnosis not present

## 2020-01-11 DIAGNOSIS — Z9049 Acquired absence of other specified parts of digestive tract: Secondary | ICD-10-CM | POA: Diagnosis not present

## 2020-01-11 DIAGNOSIS — I248 Other forms of acute ischemic heart disease: Secondary | ICD-10-CM | POA: Diagnosis present

## 2020-01-11 DIAGNOSIS — N250 Renal osteodystrophy: Secondary | ICD-10-CM | POA: Diagnosis not present

## 2020-01-11 LAB — BLOOD GAS, VENOUS
Acid-base deficit: 5.7 mmol/L — ABNORMAL HIGH (ref 0.0–2.0)
Bicarbonate: 19.3 mmol/L — ABNORMAL LOW (ref 20.0–28.0)
FIO2: 90
O2 Saturation: 73 %
Patient temperature: 37
pCO2, Ven: 44 mmHg (ref 44.0–60.0)
pH, Ven: 7.277 (ref 7.250–7.430)
pO2, Ven: 45.2 mmHg — ABNORMAL HIGH (ref 32.0–45.0)

## 2020-01-11 LAB — CBC
HCT: 22.6 % — ABNORMAL LOW (ref 36.0–46.0)
Hemoglobin: 6.9 g/dL — CL (ref 12.0–15.0)
MCH: 29.9 pg (ref 26.0–34.0)
MCHC: 30.5 g/dL (ref 30.0–36.0)
MCV: 97.8 fL (ref 80.0–100.0)
Platelets: 249 10*3/uL (ref 150–400)
RBC: 2.31 MIL/uL — ABNORMAL LOW (ref 3.87–5.11)
RDW: 15.3 % (ref 11.5–15.5)
WBC: 14.9 10*3/uL — ABNORMAL HIGH (ref 4.0–10.5)
nRBC: 0 % (ref 0.0–0.2)

## 2020-01-11 LAB — TROPONIN I (HIGH SENSITIVITY)
Troponin I (High Sensitivity): 33 ng/L — ABNORMAL HIGH (ref <18)
Troponin I (High Sensitivity): 65 ng/L — ABNORMAL HIGH (ref <18)

## 2020-01-11 LAB — COMPREHENSIVE METABOLIC PANEL
ALT: 59 U/L — ABNORMAL HIGH (ref 0–44)
AST: 29 U/L (ref 15–41)
Albumin: 3.9 g/dL (ref 3.5–5.0)
Alkaline Phosphatase: 95 U/L (ref 38–126)
Anion gap: 15 (ref 5–15)
BUN: 75 mg/dL — ABNORMAL HIGH (ref 6–20)
CO2: 20 mmol/L — ABNORMAL LOW (ref 22–32)
Calcium: 9.3 mg/dL (ref 8.9–10.3)
Chloride: 103 mmol/L (ref 98–111)
Creatinine, Ser: 11.06 mg/dL — ABNORMAL HIGH (ref 0.44–1.00)
GFR calc Af Amer: 4 mL/min — ABNORMAL LOW (ref >60)
GFR calc non Af Amer: 4 mL/min — ABNORMAL LOW (ref >60)
Glucose, Bld: 382 mg/dL — ABNORMAL HIGH (ref 70–99)
Potassium: 4.2 mmol/L (ref 3.5–5.1)
Sodium: 138 mmol/L (ref 135–145)
Total Bilirubin: 0.8 mg/dL (ref 0.3–1.2)
Total Protein: 7.2 g/dL (ref 6.5–8.1)

## 2020-01-11 LAB — GLUCOSE, CAPILLARY
Glucose-Capillary: 168 mg/dL — ABNORMAL HIGH (ref 70–99)
Glucose-Capillary: 151 mg/dL — ABNORMAL HIGH (ref 70–99)

## 2020-01-11 LAB — PREPARE RBC (CROSSMATCH)

## 2020-01-11 LAB — RESPIRATORY PANEL BY RT PCR (FLU A&B, COVID)
Influenza A by PCR: NEGATIVE
Influenza B by PCR: NEGATIVE
SARS Coronavirus 2 by RT PCR: POSITIVE — AB

## 2020-01-11 LAB — CBG MONITORING, ED: Glucose-Capillary: 279 mg/dL — ABNORMAL HIGH (ref 70–99)

## 2020-01-11 LAB — LACTIC ACID, PLASMA
Lactic Acid, Venous: 1.3 mmol/L (ref 0.5–1.9)
Lactic Acid, Venous: 2.3 mmol/L (ref 0.5–1.9)

## 2020-01-11 LAB — BRAIN NATRIURETIC PEPTIDE: B Natriuretic Peptide: 191 pg/mL — ABNORMAL HIGH (ref 0.0–100.0)

## 2020-01-11 MED ORDER — SODIUM CHLORIDE 0.9 % IV SOLN
100.0000 mL | INTRAVENOUS | Status: DC | PRN
  Administered 2020-01-11: 100 mL via INTRAVENOUS

## 2020-01-11 MED ORDER — SEVELAMER CARBONATE 800 MG PO TABS
1600.0000 mg | ORAL_TABLET | Freq: Two times a day (BID) | ORAL | Status: DC
  Administered 2020-01-12 – 2020-01-15 (×6): 1600 mg via ORAL
  Filled 2020-01-11 (×6): qty 2

## 2020-01-11 MED ORDER — PROCHLORPERAZINE EDISYLATE 10 MG/2ML IJ SOLN
10.0000 mg | Freq: Once | INTRAMUSCULAR | Status: AC
  Administered 2020-01-11: 10 mg via INTRAVENOUS
  Filled 2020-01-11: qty 2

## 2020-01-11 MED ORDER — FUROSEMIDE 10 MG/ML IJ SOLN
160.0000 mg | Freq: Once | INTRAVENOUS | Status: AC
  Administered 2020-01-11: 03:00:00 160 mg via INTRAVENOUS
  Filled 2020-01-11: qty 16

## 2020-01-11 MED ORDER — INSULIN ASPART 100 UNIT/ML ~~LOC~~ SOLN
0.0000 [IU] | Freq: Three times a day (TID) | SUBCUTANEOUS | Status: DC
  Administered 2020-01-11: 17:00:00 2 [IU] via SUBCUTANEOUS
  Administered 2020-01-12 (×2): 3 [IU] via SUBCUTANEOUS
  Administered 2020-01-13 – 2020-01-14 (×3): 2 [IU] via SUBCUTANEOUS
  Administered 2020-01-14 (×2): 1 [IU] via SUBCUTANEOUS
  Administered 2020-01-15 (×2): 2 [IU] via SUBCUTANEOUS

## 2020-01-11 MED ORDER — FLUTICASONE PROPIONATE HFA 110 MCG/ACT IN AERO
1.0000 | INHALATION_SPRAY | Freq: Two times a day (BID) | RESPIRATORY_TRACT | Status: DC
  Filled 2020-01-11: qty 12

## 2020-01-11 MED ORDER — NITROGLYCERIN 2 % TD OINT
1.0000 [in_us] | TOPICAL_OINTMENT | Freq: Four times a day (QID) | TRANSDERMAL | Status: AC
  Administered 2020-01-11: 02:00:00 1 [in_us] via TOPICAL
  Filled 2020-01-11 (×2): qty 1

## 2020-01-11 MED ORDER — ALBUTEROL SULFATE HFA 108 (90 BASE) MCG/ACT IN AERS
2.0000 | INHALATION_SPRAY | Freq: Four times a day (QID) | RESPIRATORY_TRACT | Status: DC | PRN
  Administered 2020-01-11: 20:00:00 2 via RESPIRATORY_TRACT
  Filled 2020-01-11: qty 6.7

## 2020-01-11 MED ORDER — ZINC SULFATE 220 (50 ZN) MG PO CAPS
220.0000 mg | ORAL_CAPSULE | Freq: Every day | ORAL | Status: DC
  Administered 2020-01-13: 12:00:00 220 mg via ORAL
  Filled 2020-01-11 (×3): qty 1

## 2020-01-11 MED ORDER — LIDOCAINE-PRILOCAINE 2.5-2.5 % EX CREA: 1.0000 "application " | TOPICAL_CREAM | CUTANEOUS | Status: DC | PRN

## 2020-01-11 MED ORDER — ONDANSETRON HCL 4 MG/2ML IJ SOLN
4.0000 mg | Freq: Four times a day (QID) | INTRAMUSCULAR | Status: DC | PRN
  Administered 2020-01-14: 4 mg via INTRAVENOUS
  Filled 2020-01-11: qty 2

## 2020-01-11 MED ORDER — INSULIN ASPART 100 UNIT/ML ~~LOC~~ SOLN
3.0000 [IU] | Freq: Three times a day (TID) | SUBCUTANEOUS | Status: DC
  Administered 2020-01-11 – 2020-01-12 (×2): 3 [IU] via SUBCUTANEOUS

## 2020-01-11 MED ORDER — ONDANSETRON HCL 4 MG PO TABS: 4.0000 mg | ORAL_TABLET | Freq: Four times a day (QID) | ORAL | Status: DC | PRN

## 2020-01-11 MED ORDER — ACETAMINOPHEN 650 MG RE SUPP: 650.0000 mg | Freq: Four times a day (QID) | RECTAL | Status: DC | PRN

## 2020-01-11 MED ORDER — FUROSEMIDE 10 MG/ML IJ SOLN
INTRAMUSCULAR | Status: AC
  Filled 2020-01-11: qty 20

## 2020-01-11 MED ORDER — ROPINIROLE HCL ER 2 MG PO TB24
2.0000 mg | ORAL_TABLET | Freq: Every day | ORAL | Status: DC
  Filled 2020-01-11 (×4): qty 1

## 2020-01-11 MED ORDER — SALINE SPRAY 0.65 % NA SOLN
1.0000 | NASAL | Status: DC | PRN
  Administered 2020-01-11: 22:00:00 1 via NASAL
  Filled 2020-01-11: qty 44

## 2020-01-11 MED ORDER — LIDOCAINE HCL (PF) 1 % IJ SOLN: 5.0000 mL | INTRAMUSCULAR | Status: DC | PRN

## 2020-01-11 MED ORDER — GLIPIZIDE 5 MG PO TABS
5.0000 mg | ORAL_TABLET | Freq: Two times a day (BID) | ORAL | Status: DC
  Administered 2020-01-11: 09:00:00 5 mg via ORAL
  Filled 2020-01-11 (×5): qty 1

## 2020-01-11 MED ORDER — FUROSEMIDE 40 MG PO TABS
40.0000 mg | ORAL_TABLET | Freq: Two times a day (BID) | ORAL | Status: DC
  Administered 2020-01-11: 17:00:00 40 mg via ORAL
  Administered 2020-01-12: 80 mg via ORAL
  Administered 2020-01-12 – 2020-01-14 (×4): 40 mg via ORAL
  Administered 2020-01-14 – 2020-01-15 (×2): 80 mg via ORAL
  Administered 2020-01-15: 40 mg via ORAL
  Filled 2020-01-11 (×2): qty 1
  Filled 2020-01-11 (×2): qty 2
  Filled 2020-01-11 (×2): qty 1
  Filled 2020-01-11: qty 2
  Filled 2020-01-11: qty 1
  Filled 2020-01-11: qty 2
  Filled 2020-01-11: qty 1

## 2020-01-11 MED ORDER — ACETAMINOPHEN 325 MG PO TABS
650.0000 mg | ORAL_TABLET | Freq: Four times a day (QID) | ORAL | Status: DC | PRN
  Administered 2020-01-11 – 2020-01-14 (×3): 650 mg via ORAL
  Filled 2020-01-11 (×3): qty 2

## 2020-01-11 MED ORDER — FERRIC CITRATE 1 GM 210 MG(FE) PO TABS
420.0000 mg | ORAL_TABLET | Freq: Two times a day (BID) | ORAL | Status: DC
  Administered 2020-01-12 – 2020-01-15 (×7): 420 mg via ORAL
  Filled 2020-01-11 (×16): qty 2

## 2020-01-11 MED ORDER — NIFEDIPINE ER OSMOTIC RELEASE 30 MG PO TB24
60.0000 mg | ORAL_TABLET | Freq: Every evening | ORAL | Status: DC
  Administered 2020-01-11 – 2020-01-15 (×5): 60 mg via ORAL
  Filled 2020-01-11 (×6): qty 2

## 2020-01-11 MED ORDER — ENOXAPARIN SODIUM 40 MG/0.4ML ~~LOC~~ SOLN
40.0000 mg | SUBCUTANEOUS | Status: DC
  Filled 2020-01-11: qty 0.4

## 2020-01-11 MED ORDER — PANTOPRAZOLE SODIUM 40 MG PO TBEC
40.0000 mg | DELAYED_RELEASE_TABLET | Freq: Every day | ORAL | Status: DC
  Administered 2020-01-12 – 2020-01-15 (×4): 40 mg via ORAL
  Filled 2020-01-11 (×4): qty 1

## 2020-01-11 MED ORDER — HEPARIN SODIUM (PORCINE) 1000 UNIT/ML DIALYSIS
1000.0000 [IU] | Freq: Once | INTRAMUSCULAR | Status: DC
  Filled 2020-01-11 (×2): qty 1

## 2020-01-11 MED ORDER — PENTAFLUOROPROP-TETRAFLUOROETH EX AERO: 1.0000 "application " | INHALATION_SPRAY | CUTANEOUS | Status: DC | PRN

## 2020-01-11 MED ORDER — SODIUM CHLORIDE 0.9% IV SOLUTION: Freq: Once | INTRAVENOUS | Status: DC

## 2020-01-11 MED ORDER — MORPHINE SULFATE (PF) 4 MG/ML IV SOLN
4.0000 mg | Freq: Once | INTRAVENOUS | Status: AC
  Administered 2020-01-11: 02:00:00 4 mg via INTRAVENOUS
  Filled 2020-01-11: qty 1

## 2020-01-11 MED ORDER — PLECANATIDE 3 MG PO TABS
3.0000 mg | ORAL_TABLET | Freq: Every day | ORAL | Status: DC
  Filled 2020-01-11 (×3): qty 1

## 2020-01-11 MED ORDER — SODIUM CHLORIDE 0.9 % IV SOLN: 100.0000 mL | INTRAVENOUS | Status: DC | PRN

## 2020-01-11 MED ORDER — CHLORHEXIDINE GLUCONATE CLOTH 2 % EX PADS
6.0000 | MEDICATED_PAD | Freq: Every day | CUTANEOUS | Status: DC
  Administered 2020-01-12 – 2020-01-15 (×4): 6 via TOPICAL

## 2020-01-11 MED ORDER — CINACALCET HCL 30 MG PO TABS
30.0000 mg | ORAL_TABLET | Freq: Every day | ORAL | Status: DC
  Administered 2020-01-11 – 2020-01-15 (×5): 30 mg via ORAL
  Filled 2020-01-11 (×7): qty 1

## 2020-01-11 MED ORDER — POLYETHYLENE GLYCOL 3350 17 G PO PACK: 17.0000 g | PACK | Freq: Every day | ORAL | Status: DC | PRN

## 2020-01-11 MED ORDER — SEVELAMER CARBONATE 800 MG PO TABS
2400.0000 mg | ORAL_TABLET | Freq: Every day | ORAL | Status: DC
  Administered 2020-01-12 – 2020-01-15 (×3): 2400 mg via ORAL
  Filled 2020-01-11 (×3): qty 3

## 2020-01-11 MED ORDER — HYDRALAZINE HCL 20 MG/ML IJ SOLN
20.0000 mg | Freq: Once | INTRAMUSCULAR | Status: AC
  Administered 2020-01-11: 04:00:00 20 mg via INTRAVENOUS
  Filled 2020-01-11: qty 1

## 2020-01-11 NOTE — Plan of Care (Signed)

## 2020-01-11 NOTE — Consult Note (Signed)
[facility] Renal Consultation Note    Indication for Consultation:  Management of ESRD/hemodialysis; anemia, hypertension/volume and secondary hyperparathyroidism  HPI: Patricia [name] is a 49 y.o. female.  Pt has a PMH significant for DM, HTN, chronic diastolic CHF, morbid obesity, ESRD, and diagnosis of covid-19 infection since 12/13/19.  She has been hospitalized twice (once in [facility] in December and more recently 12/29/19) and received remdesivir and IV steroids (just discharged from [facility] on 01/02/20 after she presented with acute hypoxic respiratory distress).  She presented to [facility] last night with acute respiratory distress.  She did go to HD on Friday and received her full treatment, and had her dry weight challenged.  She also reported associated chest pain/tightness.  She was found to have tachycardia, hypoxia, and CXR with pulmonary edema.  She was placed on bipap overnight.  We were consulted to provide urgent HD due to her acute on chronic hypoxic respiratory failure with evidence of volume overload.  She was started on HD early this morning and is tolerating it well but her Hgb was noted to be down to 6.9.  Past Medical History:  Diagnosis Date  . Anemia   . Ankle fracture   . Arthritis   . Blood transfusion without reported diagnosis   . Breast cancer ([place])   . Cancer ([place])   . Diabetes mellitus without complication ([place])   . Dialysis patient ([place])    mon, wed, friday,   . End stage renal disease on dialysis [facility])    M/W/F [facility] in [place]  . GERD (gastroesophageal reflux disease)   . Hypertension   . OSA (obstructive sleep apnea)    uses CPAP sometimes  . Pneumonia   . PONV (postoperative nausea and vomiting)   . Wears glasses    Past Surgical History:  Procedure Laterality Date  . ABDOMINAL HYSTERECTOMY    . AV FISTULA PLACEMENT  11/2014   at [facility] N/A 07/10/2016   Procedure: BALLOON DILATION;  Surgeon: Danie Binder, MD;  [facility];  Service: Endoscopy;  Laterality: N/A;  Pyloric dilation  . BREAST LUMPECTOMY    . CESAREAN SECTION    . CHOLECYSTECTOMY    . COLONOSCOPY WITH PROPOFOL N/A 09/27/2016   Dr. Gala Romney: Internal hemorrhoids repeat colonoscopy in 10 years  . DILATION AND CURETTAGE OF UTERUS    . ESOPHAGOGASTRODUODENOSCOPY N/A 07/10/2016   Dr.Fields- normal esophagus, gastric stenosis was found at the pylorus, gastritis on bx, normal examined duodenun  . EXTERNAL FIXATION REMOVAL Right 10/29/2018   Procedure: REMOVAL RIGHT ANKLE BIOMET ZIMMER EXTERNAL FIXATOR, SHORT LEG CAST APPLICATION;  Surgeon: Marybelle Killings, MD;  Location: [place];  Service: Orthopedics;  Laterality: Right;  . MASTECTOMY     left sided  . ORIF ANKLE FRACTURE Right 10/06/2018   Procedure: OPEN REDUCTION INTERNAL FIXATION (ORIF) RIGHT ANKLE TRIMALLEOLAR;  Surgeon: Marybelle Killings, MD;  Location: [place];  Service: Orthopedics;  Laterality: Right;   Family History:   Family History  Problem Relation Age of Onset  . Diabetes Mellitus II Mother   . Hypertension Mother   . Hypertension Sister   . Hypertension Sister   . Colon cancer Neg Hx    Social History:  reports that she has never smoked. She has never used smokeless tobacco. She reports that she does not drink alcohol or use drugs. Allergies  Allergen Reactions  . Amlodipine Besylate Rash and Other (See Comments)  dizziness  . Reglan [Metoclopramide] Other (See Comments)    hallucinations    Prior to Admission medications   Medication Sig Start Date End Date Taking? Authorizing Provider  albuterol (PROVENTIL HFA;VENTOLIN HFA) 108 (90 Base) MCG/ACT inhaler Inhale 2 puffs into the lungs every 6 (six) hours as needed for wheezing or shortness of breath. 11/28/17   Kathie Dike, MD  albuterol (PROVENTIL) (2.5 MG/3ML) 0.083% nebulizer solution Take 3 mLs (2.5 mg total) by nebulization every 6 (six) hours as needed for wheezing or shortness of breath.  11/28/17   Kathie Dike, MD  ascorbic acid (VITAMIN C) 500 MG tablet Take 1 tablet (500 mg total) by mouth daily. 01/03/20   Hongalgi, Lenis Dickinson, MD  cinacalcet (SENSIPAR) 30 MG tablet Take 30 mg by mouth every morning.     [provider]  dicyclomine (BENTYL) 10 MG capsule TAKE 1 CAPSULE (10 MG TOTAL) BY MOUTH 2 (TWO) TIMES DAILY AS NEEDED FOR SPASMS. 10/06/19 01/04/20  Annitta Needs, NP  Ferric Citrate (AURYXIA) 1 GM 210 MG(Fe) TABS Take 2 tablets by mouth 2 (two) times daily with a meal. With Breakfast & with supper    [provider]  FLOVENT HFA 110 MCG/ACT inhaler 1 puff 2 (two) times daily. 12/04/19   [provider]  furosemide (LASIX) 40 MG tablet Take 40-80 mg by mouth 2 (two) times daily. Take 2 tablets (80mg ) in the morning and take 1 tablet (40mg ) at bedtime    [provider]  glipiZIDE (GLUCOTROL) 5 MG tablet Take 5 mg by mouth 2 (two) times daily as needed. For blood sugar levels over 150    [provider]  ipratropium (ATROVENT) 0.03 % nasal spray Place 2 sprays into both nostrils 2 (two) times daily.  06/06/19   [provider]  multivitamin (RENA-VIT) TABS tablet Take 1 tablet by mouth daily.    [provider]  NIFEdipine (ADALAT CC) 60 MG 24 hr tablet Take 1 tablet (60 mg total) by mouth every evening. 01/02/20   Hongalgi, Lenis Dickinson, MD  ondansetron (ZOFRAN-ODT) 4 MG disintegrating tablet Take 4 mg by mouth every 8 (eight) hours as needed for nausea or vomiting.    [provider]  pantoprazole (PROTONIX) 40 MG tablet Take 40 mg by mouth daily.    [provider]  polyethylene glycol (MIRALAX) 17 g packet Take 17 g by mouth daily as needed for mild constipation. 01/02/20   Hongalgi, Lenis Dickinson, MD  rOPINIRole (REQUIP XL) 2 MG 24 hr tablet Take 2 mg by mouth at bedtime.  09/10/16   [provider]  sevelamer carbonate (RENVELA) 800 MG tablet Take 800 mg by mouth See admin instructions. Take 2 tablets  (1600 mg) in the morning, take 3 tablets (2400 mg) with lunch or snack,  and take 2 tablets (1600 mg) by mouth in the evening with dinner meal.    [provider]  sodium chloride (OCEAN) 0.65 % SOLN nasal spray Place 1 spray into both nostrils as needed for congestion. 01/02/20   Hongalgi, Lenis Dickinson, MD  TRULANCE 3 MG TABS TAKE 1 TABLET BY MOUTH EVERY DAY Patient taking differently: TAKE 1 TABLET BY MOUTH EVERY DAY 10/30/19   Erenest Rasher, PA-C  zinc sulfate 220 (50 Zn) MG capsule Take 1 capsule (220 mg total) by mouth daily. 01/03/20   Modena Jansky, MD   Current Facility-Administered Medications  Medication Dose Route Frequency Provider Last Rate Last Admin  . 0.9 %  sodium chloride infusion (Manually program via Guardrails IV Fluids)   Intravenous Once Donato Heinz, MD      . 0.9 %  sodium chloride infusion  100 mL Intravenous PRN Justin Mend, MD      . 0.9 %  sodium chloride infusion  100 mL Intravenous PRN Justin Mend, MD      . acetaminophen (TYLENOL) tablet 650 mg  650 mg Oral Q6H PRN Reubin Milan, MD   650 mg at 01/11/20 7494   Or  . acetaminophen (TYLENOL) suppository 650 mg  650 mg Rectal Q6H PRN Reubin Milan, MD      . albuterol (VENTOLIN HFA) 108 (90 Base) MCG/ACT inhaler 2 puff  2 puff Inhalation Q6H PRN Reubin Milan, MD      . Chlorhexidine Gluconate Cloth 2 % PADS 6 each  6 each Topical Q0600 Justin Mend, MD      . cinacalcet [facility]) tablet 30 mg  30 mg Oral Q breakfast Reubin Milan, MD   30 mg at 01/11/20 4967  . ferric citrate (AURYXIA) tablet 420 mg  420 mg Oral BID WC Reubin Milan, MD      . fluticasone [facility]) 110 MCG/ACT inhaler 1 puff  1 puff Inhalation BID Reubin Milan, MD      . furosemide (LASIX) tablet 40-80 mg  40-80 mg Oral BID Reubin Milan, MD      . glipiZIDE (GLUCOTROL) tablet 5 mg  5 mg Oral BID AC Reubin Milan, MD   5 mg at 01/11/20 5916  . heparin injection  1,000 Units  1,000 Units Dialysis Once in dialysis Justin Mend, MD      . insulin aspart (novoLOG) injection 0-9 Units  0-9 Units Subcutaneous TID WC Reubin Milan, MD      . lidocaine (PF) (XYLOCAINE) 1 % injection 5 mL  5 mL Intradermal PRN Justin Mend, MD      . lidocaine-prilocaine (EMLA) cream 1 application  1 application Topical PRN Justin Mend, MD      . nitroGLYCERIN (NITROGLYN) 2 % ointment 1 inch  1 inch Topical Q6H Reubin Milan, MD   1 inch at 01/11/20 0150  . ondansetron (ZOFRAN) tablet 4 mg  4 mg Oral Q6H PRN Reubin Milan, MD       Or  . ondansetron [facility]) injection 4 mg  4 mg Intravenous Q6H PRN Reubin Milan, MD      . pantoprazole ([place]) EC tablet 40 mg  40 mg Oral Daily Reubin Milan, MD      . pentafluoroprop-tetrafluoroeth [facility]) aerosol 1 application  1 application Topical PRN Justin Mend, MD      . Plecanatide TABS 3 mg  3 mg Oral Daily Reubin Milan, MD      . polyethylene glycol [facility]) packet 17 g  17 g Oral Daily PRN Reubin Milan, MD      . rOPINIRole (REQUIP XL) 24 hr tablet 2 mg  2 mg Oral QHS Reubin Milan, MD      . zinc sulfate capsule 220 mg  220 mg Oral Daily Reubin Milan, MD       Current Outpatient Medications  Medication Sig Dispense Refill  . albuterol (PROVENTIL HFA;VENTOLIN HFA) 108 (90 Base) MCG/ACT inhaler Inhale 2 puffs into the lungs every 6 (six) hours as needed for wheezing or shortness of breath. 1 Inhaler 2  .  albuterol (PROVENTIL) (2.5 MG/3ML) 0.083% nebulizer solution Take 3 mLs (2.5 mg total) by nebulization every 6 (six) hours as needed for wheezing or shortness of breath. 75 mL 12  . ascorbic acid (VITAMIN C) 500 MG tablet Take 1 tablet (500 mg total) by mouth daily. 30 tablet 0  . cinacalcet (SENSIPAR) 30 MG tablet Take 30 mg by mouth every morning.     . dicyclomine (BENTYL) 10 MG capsule TAKE 1 CAPSULE (10 MG TOTAL) BY MOUTH 2 (TWO) TIMES  DAILY AS NEEDED FOR SPASMS. 180 capsule 1  . Ferric Citrate (AURYXIA) 1 GM 210 MG(Fe) TABS Take 2 tablets by mouth 2 (two) times daily with a meal. With Breakfast & with supper    . FLOVENT HFA 110 MCG/ACT inhaler 1 puff 2 (two) times daily.    . furosemide (LASIX) 40 MG tablet Take 40-80 mg by mouth 2 (two) times daily. Take 2 tablets (80mg ) in the morning and take 1 tablet (40mg ) at bedtime    . glipiZIDE (GLUCOTROL) 5 MG tablet Take 5 mg by mouth 2 (two) times daily as needed. For blood sugar levels over 150    . ipratropium (ATROVENT) 0.03 % nasal spray Place 2 sprays into both nostrils 2 (two) times daily.     . multivitamin (RENA-VIT) TABS tablet Take 1 tablet by mouth daily.    Marland Kitchen NIFEdipine (ADALAT CC) 60 MG 24 hr tablet Take 1 tablet (60 mg total) by mouth every evening.    . ondansetron (ZOFRAN-ODT) 4 MG disintegrating tablet Take 4 mg by mouth every 8 (eight) hours as needed for nausea or vomiting.    . pantoprazole (PROTONIX) 40 MG tablet Take 40 mg by mouth daily.    . polyethylene glycol (MIRALAX) 17 g packet Take 17 g by mouth daily as needed for mild constipation.    Marland Kitchen rOPINIRole (REQUIP XL) 2 MG 24 hr tablet Take 2 mg by mouth at bedtime.     . sevelamer carbonate (RENVELA) 800 MG tablet Take 800 mg by mouth See admin instructions. Take 2 tablets (1600 mg) in the morning, take 3 tablets (2400 mg) with lunch or snack,  and take 2 tablets (1600 mg) by mouth in the evening with dinner meal.    . sodium chloride (OCEAN) 0.65 % SOLN nasal spray Place 1 spray into both nostrils as needed for congestion. 60 mL 0  . TRULANCE 3 MG TABS TAKE 1 TABLET BY MOUTH EVERY DAY (Patient taking differently: TAKE 1 TABLET BY MOUTH EVERY DAY) 30 tablet 1  . zinc sulfate 220 (50 Zn) MG capsule Take 1 capsule (220 mg total) by mouth daily. 30 capsule 0   Labs: Basic Metabolic Panel: Recent Labs  Lab 01/11/20 0010  NA 138  K 4.2  CL 103  CO2 20*  GLUCOSE 382*  BUN 75*  CREATININE 11.06*  CALCIUM  9.3   Liver Function Tests: Recent Labs  Lab 01/11/20 0010  AST 29  ALT 59*  ALKPHOS 95  BILITOT 0.8  PROT 7.2  ALBUMIN 3.9   No results for input(s): LIPASE, AMYLASE in the last 168 hours. No results for input(s): AMMONIA in the last 168 hours. CBC: Recent Labs  Lab 01/11/20 0010  WBC 14.9*  HGB 6.9*  HCT 22.6*  MCV 97.8  PLT 249   Cardiac Enzymes: No results for input(s): CKTOTAL, CKMB, CKMBINDEX, TROPONINI in the last 168 hours. CBG: Recent Labs  Lab 01/11/20 0808  GLUCAP 279*   Iron Studies: No results for  input(s): IRON, TIBC, TRANSFERRIN, FERRITIN in the last 72 hours. Studies/Results: DG Chest Port 1 View  Result Date: 01/11/2020 CLINICAL DATA:  Shortness of breath, worsening tonight EXAM: PORTABLE CHEST 1 VIEW COMPARISON:  Radiograph 12/29/2019, 05/23/2019 FINDINGS: Bilateral basilar and perihilar predominant hazy airspace opacities with indistinctness and cephalization of the pulmonary vascularity. Cardiomegaly is increased from comparison portable radiograph. The hemidiaphragms are partially obscured which could reflect either consolidation at the lung bases or layering pleural fluid. No pneumothorax. Postsurgical changes in the left chest wall are similar to prior. No acute osseous or soft tissue abnormality. Telemetry leads overlie the chest. IMPRESSION: Findings most consistent with CHF with pulmonary edema and increasing cardiomegaly. Obscuration of the hemidiaphragms could reflect some airspace disease in the lung bases or layering pleural fluid. Notably, infection could have a similar appearance in the appropriate clinical setting. Electronically Signed   By: Lovena Le M.D.   On: 01/11/2020 00:22    ROS: Pertinent items are noted in HPI. Physical Exam: Vitals:   01/11/20 0945 01/11/20 1000 01/11/20 1015 01/11/20 1030  BP: (!) 148/93 (!) 179/109 (!) 155/78 (!) 153/77  Pulse: 88 88 95 88  Resp: 16 18 19  (!) 21  Temp: 97.9 F (36.6 C)  97.8 F (36.6  C)   TempSrc: Oral  Oral   SpO2: 100% 100% 100%   Weight:      Height:          Weight change:   Intake/Output Summary (Last 24 hours) at 01/11/2020 1037 Last data filed at 01/11/2020 1000 Gross per 24 hour  Intake 64.71 ml  Output --  Net 64.71 ml   BP (!) 153/77   Pulse 88   Temp 97.8 F (36.6 C) (Oral)   Resp (!) 21   Ht 5\' 3"  (1.6 m)   Wt 115.4 kg   SpO2 100%   BMI 45.07 kg/m  General appearance: alert, cooperative, no distress and moderately obese Head: Normocephalic, without obvious abnormality, atraumatic Eyes: negative findings: lids and lashes normal, conjunctivae and sclerae normal and corneas clear Resp: clear to auscultation bilaterally Cardio: regular rate and rhythm, S1, S2 normal, no murmur, click, rub or gallop GI: soft, non-tender; bowel sounds normal; no masses,  no organomegaly Extremities: extremities normal, atraumatic, no cyanosis or edema and LUE AVF +T/B Dialysis Access:  Dialysis Orders: Outpt PN:[facility] MWF  4h 4min 400/600 110.5kg Hep 1000+ 1800/hr 2/2.5 bath L AVF  Assessment/Plan: 1.  Acute on chronic hypoxic respiratory failure- with evidence of volume overload and anemia.  Already on HD and will UF as tolerated.  Her edw was recently challenged and admits to poor appetite since her diagnosis of covid 2.  ESRD -  Cont with hd qMWF 3.  Hypertension/volume  - UF as tolerated and cont to challenge edw 4.  Anemia  - plan to transfuse 1 unit prbc with HD today.  Likely explains her symptoms 5.  Metabolic bone disease -  Cont with home meds 6.  Nutrition -  Renal diet, carb modified 7. covid-19- diagnosed over 21 days ago and s/p remdesivir/decadron.  Not certain +PCR means anything as she is not febrile and SOB likely related to volume and not pneumonia.    Donetta Potts, MD [place] Pager [phone] 01/11/2020, 10:37 AM

## 2020-01-11 NOTE — ED Notes (Signed)
Pt switched to [facility] per MD permission due to transportation issues, pt tolerating at this time. O2 sats 100%.

## 2020-01-11 NOTE — Progress Notes (Signed)
Pt reported to RN that she had taken her home ropinirole early. Pt stated that she took it because her legs were really bothering her after dialysis. RN informed pt that she did not need to be taking her home medications that we would give ours to her from the hospital. RN instructed that if the patient wanted to take her home meds, then we would have to get them verified by the pharmacist and a label put on them so we could scan the med. Pt stated that someone had told her that she could take her home meds because we did not have them here. RN ensured patient that we would have her medications at the hospital and provide them for her. Pt verbalizes understanding and will not take any more home medications.

## 2020-01-11 NOTE — Procedures (Signed)
    HEMODIALYSIS TREATMENT NOTE:  4 hour treatment completed via left upper arm AVF (15g/antegrade). Goal NOT met:  UF was interrupted x 20 minutes and two 200cc NS boluses were given for c/o leg and abdominal cramps.  1 unit PRBCs transfused in 30 minutes.  All blood was returned and hemostasis was achieved in 15 minutes.  O2 weaned from 14L to 4.  Treatment fluid balance: PRBC  +315 NS  +700 UF removed -4068 __________________ NET UF 0413   Rockwell Alexandria, RN

## 2020-01-11 NOTE — ED Notes (Signed)
Pt still waiting for transport to [facility]

## 2020-01-11 NOTE — ED Notes (Signed)
Pt transitioned down to 8L High flow [place]. Tolerating well. Denies discomfort. Dialysis still in progress by Benay Pillow.

## 2020-01-11 NOTE — ED Notes (Signed)
Levada Dy, dialysis nurse, at bedside performing dialysis.

## 2020-01-11 NOTE — H&P (Addendum)
History and Physical    Patricia [name] PPI:[id number] DOB: 1971-05-19 DOA: 01/10/2020  PCP: Jake Samples, PA-C   Patient coming from: Home.  I have personally briefly reviewed patient's old medical records in [place]  Chief Complaint: Shortness of breath.  HPI: Patricia [name] is a 49 y.o. female with medical history significant of anemia, ankle fracture, osteoarthritis, history of blood transfusions, breast cancer, type 2 diabetes, ESRD on hemodialysis, GERD, hypertension, OSA on CPAP which she uses sometimes, history of pneumonia who is coming to the emergency department with progressively worse dyspnea since earlier this evening.  She also had nonradiated pressure-like discomfort on her chest, but was unable to provide further history while on the BiPAP mask.  Her O2 sat initially was 77%.  Her O2 sat is currently 100% on BiPAP mask.  ED Course: Initial vital signs temperature 97.9 F, pulse 122, respirations 24, blood pressure 77% on nasal cannula oxygen at 6%.  The patient was placed on BiPAP ventilation with significant response.  I added Nitropaste, furosemide 160 mg IVPB, morphine sulfate 4 mg IVP x1 and Compazine 10 mg IVP x1.  Dr. Sedonia Small discussed the case with nephrology who recommended admission with hemodialysis later this morning.  No stepdown beds available at this time [facility] so the patient is to be transferred to [facility].  The patient improved significantly and her lactic acid normalized from 2.3 to 1.3 mmol/L.An initial venous gas showed decreased pO2 at 45.2 mmHg, bicarbonate of 19.3 and acid base deficit of 5.7 mmol/L.  BNP was 191.0 pg/mL.  Troponin was 33 and then 65 ng/L.  White count is 14.9, hemoglobin 6.9 g/dL and platelets 249.  CMP showed a CO2 of 20 mmol/L, but the rest of the electrolytes are within normal range.  Glucose 382, BUN 75 and creatinine 11.06 mg/dL.  Hepatic functions show an elevated ALT of 59 units/L, the rest of the liver tests are within normal range.  EKG shows sinus tachycardia at 125 bpm with some VPCs.  There was diffuse repolarization abnormality.    Imaging: her chest radiograph showed findings most consistent with CHF with pulmonary edema and increasing cardiomegaly.  Possible airspace disease in the lung bases or layering pleural fluid.  Review of Systems: As per HPI otherwise 10 point review of systems negative.   Past Medical History:  Diagnosis Date  . Anemia   . Ankle fracture   . Arthritis   . Blood transfusion without reported diagnosis   . Breast cancer ([place])   . Cancer ([place])   . Diabetes mellitus without complication ([place])   . Dialysis patient ([place])    mon, wed, friday,   . End stage renal disease on dialysis [facility])    M/W/F [facility] in [place]  . GERD (gastroesophageal reflux disease)   . Hypertension   . OSA (obstructive sleep apnea)    uses CPAP sometimes  . Pneumonia   . PONV (postoperative nausea and vomiting)   . Wears glasses     Past Surgical History:  Procedure Laterality Date  . ABDOMINAL HYSTERECTOMY    . AV FISTULA PLACEMENT  11/2014   at [place] N/A 07/10/2016   Procedure: BALLOON DILATION;  Surgeon: Danie Binder, MD;  Location[facility];  Service: Endoscopy;  Laterality: N/A;  Pyloric dilation  . BREAST LUMPECTOMY    . CESAREAN SECTION    . CHOLECYSTECTOMY    . COLONOSCOPY WITH PROPOFOL N/A 09/27/2016   Dr. Gala Romney: Internal hemorrhoids  repeat colonoscopy in 10 years  . DILATION AND CURETTAGE OF UTERUS    . ESOPHAGOGASTRODUODENOSCOPY N/A 07/10/2016   Dr.Fields- normal esophagus, gastric stenosis was found at the pylorus, gastritis on bx, normal examined duodenun  . EXTERNAL FIXATION REMOVAL Right 10/29/2018   Procedure: REMOVAL RIGHT ANKLE BIOMET ZIMMER EXTERNAL FIXATOR, SHORT LEG CAST APPLICATION;  Surgeon: Marybelle Killings, MD;  Location: [place];  Service: Orthopedics;  Laterality: Right;  . MASTECTOMY     left sided  . ORIF ANKLE FRACTURE Right 10/06/2018    Procedure: OPEN REDUCTION INTERNAL FIXATION (ORIF) RIGHT ANKLE TRIMALLEOLAR;  Surgeon: Marybelle Killings, MD;  Location: [place];  Service: Orthopedics;  Laterality: Right;     reports that she has never smoked. She has never used smokeless tobacco. She reports that she does not drink alcohol or use drugs.  Allergies  Allergen Reactions  . Amlodipine Besylate Rash and Other (See Comments)    dizziness  . Reglan [Metoclopramide] Other (See Comments)    hallucinations     Family History  Problem Relation Age of Onset  . Diabetes Mellitus II Mother   . Hypertension Mother   . Hypertension Sister   . Hypertension Sister   . Colon cancer Neg Hx    Prior to Admission medications   Medication Sig Start Date End Date Taking? Authorizing Provider  albuterol (PROVENTIL HFA;VENTOLIN HFA) 108 (90 Base) MCG/ACT inhaler Inhale 2 puffs into the lungs every 6 (six) hours as needed for wheezing or shortness of breath. 11/28/17   Kathie Dike, MD  albuterol (PROVENTIL) (2.5 MG/3ML) 0.083% nebulizer solution Take 3 mLs (2.5 mg total) by nebulization every 6 (six) hours as needed for wheezing or shortness of breath. 11/28/17   Kathie Dike, MD  ascorbic acid (VITAMIN C) 500 MG tablet Take 1 tablet (500 mg total) by mouth daily. 01/03/20   Hongalgi, Lenis Dickinson, MD  cinacalcet (SENSIPAR) 30 MG tablet Take 30 mg by mouth every morning.     [provider]  dicyclomine (BENTYL) 10 MG capsule TAKE 1 CAPSULE (10 MG TOTAL) BY MOUTH 2 (TWO) TIMES DAILY AS NEEDED FOR SPASMS. 10/06/19 01/04/20  Annitta Needs, NP  Ferric Citrate (AURYXIA) 1 GM 210 MG(Fe) TABS Take 2 tablets by mouth 2 (two) times daily with a meal. With Breakfast & with supper    [provider]  FLOVENT HFA 110 MCG/ACT inhaler 1 puff 2 (two) times daily. 12/04/19   [provider]  furosemide (LASIX) 40 MG tablet Take 40-80 mg by mouth 2 (two) times daily. Take 2 tablets (80mg ) in the morning and take 1 tablet (40mg ) at  bedtime    [provider]  glipiZIDE (GLUCOTROL) 5 MG tablet Take 5 mg by mouth 2 (two) times daily as needed. For blood sugar levels over 150    [provider]  ipratropium (ATROVENT) 0.03 % nasal spray Place 2 sprays into both nostrils 2 (two) times daily.  06/06/19   [provider]  multivitamin (RENA-VIT) TABS tablet Take 1 tablet by mouth daily.    [provider]  NIFEdipine (ADALAT CC) 60 MG 24 hr tablet Take 1 tablet (60 mg total) by mouth every evening. 01/02/20   Hongalgi, Lenis Dickinson, MD  ondansetron (ZOFRAN-ODT) 4 MG disintegrating tablet Take 4 mg by mouth every 8 (eight) hours as needed for nausea or vomiting.    [provider]  pantoprazole (PROTONIX) 40 MG tablet Take 40 mg by mouth daily.    [provider]  polyethylene glycol (MIRALAX) 17 g packet Take 17 g by mouth daily as needed for mild constipation. 01/02/20   Hongalgi, Lenis Dickinson, MD  rOPINIRole (REQUIP XL) 2 MG 24 hr tablet Take 2 mg by mouth at bedtime.  09/10/16   [provider]  sevelamer carbonate (RENVELA) 800 MG tablet Take 800 mg by mouth See admin instructions. Take 2 tablets (1600 mg) in the morning, take 3 tablets (2400 mg) with lunch or snack,  and take 2 tablets (1600 mg) by mouth in the evening with dinner meal.    [provider]  sodium chloride (OCEAN) 0.65 % SOLN nasal spray Place 1 spray into both nostrils as needed for congestion. 01/02/20   Hongalgi, Lenis Dickinson, MD  TRULANCE 3 MG TABS TAKE 1 TABLET BY MOUTH EVERY DAY Patient taking differently: TAKE 1 TABLET BY MOUTH EVERY DAY 10/30/19   Erenest Rasher, PA-C  zinc sulfate 220 (50 Zn) MG capsule Take 1 capsule (220 mg total) by mouth daily. 01/03/20   Modena Jansky, MD    Physical Exam: Vitals:   01/11/20 0008 01/11/20 0030 01/11/20 0100 01/11/20 0130  BP:  (!) 190/96 (!) 188/99 (!) 194/94  Pulse:  (!) 113 (!) 102 (!) 103  Resp:  20 (!) 34 (!) 25  Temp:      TempSrc:      SpO2:  100% 100% 100% 100%  Weight:      Height:        Constitutional: NAD, on BiPAP ventilation mode. Eyes: PERRL, lids and conjunctivae are pale. ENMT: BiPAP mask on.  Mucous membranes are moist. Posterior pharynx clear of any exudate or lesions. Neck: Mild JVD, supple, no masses, no thyromegaly Respiratory: Decreased breath sounds on bases with bilateral rales on lower and mid lung fields. No accessory muscle use.  Cardiovascular: Tachycardic at 102, no murmurs / rubs / gallops. No extremity edema. 2+ pedal pulses. No carotid bruits.  Abdomen: Obese, nondistended.  Bowel sounds positive.  Soft, tenderness, no masses palpated. No hepatosplenomegaly.  Musculoskeletal: no clubbing / cyanosis. Good ROM, no contractures. Normal muscle tone.  Skin: no gross rashes, lesions, ulcers on limited dermatological examination. Neurologic: CN 2-12 grossly intact. Sensation intact, DTR normal. Strength 5/5 in all 4.  Psychiatric: Normal judgment and insight. Alert and oriented x 3.   Labs on Admission: I have personally reviewed following labs and imaging studies  CBC: Recent Labs  Lab 01/11/20 0010  WBC 14.9*  HGB 6.9*  HCT 22.6*  MCV 97.8  PLT 098   Basic Metabolic Panel: Recent Labs  Lab 01/11/20 0010  NA 138  K 4.2  CL 103  CO2 20*  GLUCOSE 382*  BUN 75*  CREATININE 11.06*  CALCIUM 9.3   GFR: Estimated Creatinine Clearance: 7.5 mL/min (A) (by C-G formula based on SCr of 11.06 mg/dL (H)). Liver Function Tests: Recent Labs  Lab 01/11/20 0010  AST 29  ALT 59*  ALKPHOS 95  BILITOT 0.8  PROT 7.2  ALBUMIN 3.9   No results for input(s): LIPASE, AMYLASE in the last 168 hours. No results for input(s): AMMONIA in the last 168 hours. Coagulation Profile: No results for input(s): INR, PROTIME in the last 168 hours. Cardiac Enzymes: No results for input(s): CKTOTAL, CKMB, CKMBINDEX, TROPONINI in the last 168 hours. BNP (last 3 results) No results for input(s): PROBNP in the last  8760 hours. HbA1C: No results for input(s): HGBA1C in the last 72 hours. CBG: No results for input(s): GLUCAP  in the last 168 hours. Lipid Profile: No results for input(s): CHOL, HDL, LDLCALC, TRIG, CHOLHDL, LDLDIRECT in the last 72 hours. Thyroid Function Tests: No results for input(s): TSH, T4TOTAL, FREET4, T3FREE, THYROIDAB in the last 72 hours. Anemia Panel: No results for input(s): VITAMINB12, FOLATE, FERRITIN, TIBC, IRON, RETICCTPCT in the last 72 hours. Urine analysis:    Component Value Date/Time   COLORURINE STRAW (A) 12/22/2018 0847   APPEARANCEUR CLEAR 12/22/2018 0847   LABSPEC 1.007 12/22/2018 0847   PHURINE 9.0 (H) 12/22/2018 0847   GLUCOSEU >=500 (A) 12/22/2018 0847   HGBUR NEGATIVE 12/22/2018 0847   [place] 12/22/2018 [place] 12/22/2018 0847   PROTEINUR 100 (A) 12/22/2018 0847   NITRITE NEGATIVE 12/22/2018 0847   LEUKOCYTESUR NEGATIVE 12/22/2018 0847    Radiological Exams on Admission: DG Chest Port 1 View  Result Date: 01/11/2020 CLINICAL DATA:  Shortness of breath, worsening tonight EXAM: PORTABLE CHEST 1 VIEW COMPARISON:  Radiograph 12/29/2019, 05/23/2019 FINDINGS: Bilateral basilar and perihilar predominant hazy airspace opacities with indistinctness and cephalization of the pulmonary vascularity. Cardiomegaly is increased from comparison portable radiograph. The hemidiaphragms are partially obscured which could reflect either consolidation at the lung bases or layering pleural fluid. No pneumothorax. Postsurgical changes in the left chest wall are similar to prior. No acute osseous or soft tissue abnormality. Telemetry leads overlie the chest. IMPRESSION: Findings most consistent with CHF with pulmonary edema and increasing cardiomegaly. Obscuration of the hemidiaphragms could reflect some airspace disease in the lung bases or layering pleural fluid. Notably, infection could have a similar appearance in the appropriate clinical setting.  Electronically Signed   By: Lovena Le M.D.   On: 01/11/2020 00:22    EKG: Independently reviewed.  Vent. rate 125 BPM PR interval * ms QRS duration 81 ms QT/QTc 341/492 ms P-R-T axes 129 87 -60 Sinus tachycardia Ventricular premature complex Repol abnrm suggests ischemia, diffuse leads  Assessment/Plan Principal Problem:   Acute pulmonary edema (HCC)   Volume overload Continue supplemental oxygen. Continue BiPAP ventilation. Nitropaste to anterior chest wall. Furosemide 160 mg IVPB x1. Morphine sulfate 4 mg IVPB x1. Hydralazine also given for hypertension. Trend troponin level. HD orders per nephrology.  Active Problems:   End stage renal disease on dialysis [facility]) Will need dialysis later this morning. HD orders per nephrology.    SARS-2 Admitted from 12/29/2019 to 01/02/2020 Finished treatment for COVID-19. Still tested positive, but no fevers/viral symptoms. Dyspnea likely from pronounced pulmonary edema.    Type 2 diabetes mellitus (HCC) Carbohydrate modified diet. Continue glipizide 5 mg p.o. twice daily. CBG monitoring with RI SS while in the hospital.    Essential hypertension Continue nifedipine 60 mg p.o. daily. Continue furosemide 40 to 80 mg p.o. twice daily. Monitor blood pressure.    GERD (gastroesophageal reflux disease) Protonix 40 mg p.o. daily.    Lactic acidosis Resolve with BiPAP ventilation.    Anemia Defer blood transfusion while volume overloaded. To be transfused PRBC during hemodialysis. Erythropoietin per nephrology.    Elevated troponin Likely due to demand ischemia. Repeat level later this morning.    OSA (obstructive sleep apnea) Currently on BiPAP ventilation.    DVT prophylaxis: SCDs Code Status: Full code. Family Communication:  Disposition Plan: Transfer to [facility] for hemodialysis. Consults called: Nephrology consulting for HD. Admission status: Observation/progressive unit   Reubin Milan MD [facility]  If 7PM-7AM, please contact night-coverage [email or web address]  01/11/2020, 2:32 AM   This document was prepared using Dragon voice  recognition software and may contain some unintended transcription errors.

## 2020-01-11 NOTE — Progress Notes (Signed)
PROLONGED SERVICES CARE NOTE    01/11/2020 12:54 PM  Patricia [name] was seen and examined.  The H&P by the admitting provider, orders, imaging was reviewed.  The patient was seen in ED and already receiving hemodialysis.  She was reassessed after HD treatment and now able to go to a telemetry bed.  Pt will remain at [facility].  Discussed with nephrologist.  Please see new orders.  Will continue to follow.   Vitals:   01/11/20 1145 01/11/20 1200  BP: (!) 154/91 (!) 176/93  Pulse: 87 88  Resp: (!) 28 (!) 32  Temp:    SpO2: 94% 95%    Results for orders placed or performed during the hospital encounter of 01/10/20  Respiratory Panel by RT PCR (Flu A&B, Covid) - Nasopharyngeal Swab   Specimen: Nasopharyngeal Swab  Result Value Ref Range   SARS Coronavirus 2 by RT PCR POSITIVE (A) NEGATIVE   Influenza A by PCR NEGATIVE NEGATIVE   Influenza B by PCR NEGATIVE NEGATIVE  CBC  Result Value Ref Range   WBC 14.9 (H) 4.0 - 10.5 K/uL   RBC 2.31 (L) 3.87 - 5.11 MIL/uL   Hemoglobin 6.9 (LL) 12.0 - 15.0 g/dL   HCT 22.6 (L) 36.0 - 46.0 %   MCV 97.8 80.0 - 100.0 fL   MCH 29.9 26.0 - 34.0 pg   MCHC 30.5 30.0 - 36.0 g/dL   RDW 15.3 11.5 - 15.5 %   Platelets 249 150 - 400 K/uL   nRBC 0.0 0.0 - 0.2 %  Comprehensive metabolic panel  Result Value Ref Range   Sodium 138 135 - 145 mmol/L   Potassium 4.2 3.5 - 5.1 mmol/L   Chloride 103 98 - 111 mmol/L   CO2 20 (L) 22 - 32 mmol/L   Glucose, Bld 382 (H) 70 - 99 mg/dL   BUN 75 (H) 6 - 20 mg/dL   Creatinine, Ser 11.06 (H) 0.44 - 1.00 mg/dL   Calcium 9.3 8.9 - 10.3 mg/dL   Total Protein 7.2 6.5 - 8.1 g/dL   Albumin 3.9 3.5 - 5.0 g/dL   AST 29 15 - 41 U/L   ALT 59 (H) 0 - 44 U/L   Alkaline Phosphatase 95 38 - 126 U/L   Total Bilirubin 0.8 0.3 - 1.2 mg/dL   GFR calc non Af Amer 4 (L) >60 mL/min   GFR calc Af Amer 4 (L) >60 mL/min   Anion gap 15 5 - 15  Brain natriuretic peptide  Result Value Ref Range   B Natriuretic Peptide 191.0 (H) 0.0 - 100.0 pg/mL   Lactic acid, plasma  Result Value Ref Range   Lactic Acid, Venous 2.3 (HH) 0.5 - 1.9 mmol/L  Blood gas, venous (at [facility] and [facility], not at [facility])  Result Value Ref Range   FIO2 90.00    pH, Ven 7.277 7.250 - 7.430   pCO2, Ven 44.0 44.0 - 60.0 mmHg   pO2, Ven 45.2 (H) 32.0 - 45.0 mmHg   Bicarbonate 19.3 (L) 20.0 - 28.0 mmol/L   Acid-base deficit 5.7 (H) 0.0 - 2.0 mmol/L   O2 Saturation 73.0 %   Patient temperature 37.0   Lactic acid, plasma  Result Value Ref Range   Lactic Acid, Venous 1.3 0.5 - 1.9 mmol/L  CBG monitoring, ED  Result Value Ref Range   Glucose-Capillary 279 (H) 70 - 99 mg/dL  Type and screen [facility]  Result Value Ref Range   ABO/RH(D) O POS  Antibody Screen NEG    Sample Expiration 01/14/2020,2359    Unit Number [id number]    Blood Component Type RED CELLS,LR    Unit division 00    Status of Unit ISSUED    Transfusion Status OK TO TRANSFUSE    Crossmatch Result      Compatible Performed at [facility], [address] [place], [place] [address]   Prepare RBC  Result Value Ref Range   Order Confirmation      ORDER PROCESSED BY BLOOD BANK Performed at [facility], [address], [place], [place] [address]   BPAM [facility]  Result Value Ref Range   ISSUE DATE / TIME [id number]    Blood Product Unit Number [id number]    PRODUCT CODE [id number]    Unit Type and Rh 5100    Blood Product Expiration Date [id number]   Troponin I (High Sensitivity)  Result Value Ref Range   Troponin I (High Sensitivity) 33 (H) <18 ng/L  Troponin I (High Sensitivity)  Result Value Ref Range   Troponin I (High Sensitivity) 65 (H) <18 ng/L   Time Spent 35 mins  Murvin Natal, MD [facility]   01/10/2020 11:49 PM How to contact the [facility] Attending or Consulting provider [place] or covering provider during after hours [place], for this patient?  1. Check the care team in [facility] and look for a) attending/consulting [facility] provider listed and b) the [facility] team  listed 2. Log into [email or web address] and use [place]'s universal password to access. If you do not have the password, please contact the hospital operator. 3. Locate the [facility] provider you are looking for under [facility] and page to a number that you can be directly reached. 4. If you still have difficulty reaching the provider, please page the [facility] (Director on Call) for the Hospitalists listed on amion for assistance.

## 2020-01-11 NOTE — ED Provider Notes (Signed)
[facility] Emergency Department Provider Note MRN:  [id number]  Arrival date & time: 01/11/20     Chief Complaint   Shortness of Breath   History of Present Illness   Patricia [name] is a 49 y.o. year-old female with a history of ESRD on dialysis, diabetes, CHF, COPD presenting to the ED with chief complaint of shortness of breath.  Patient arrives in severe respiratory distress, endorsing chest pain but in general unable to provide further history.  I was unable to obtain an accurate HPI, PMH, or ROS due to the patient's respiratory failure.  Level 5 caveat.  Review of Systems  Positive for chest pain, respiratory failure.  Patient's Health History    Past Medical History:  Diagnosis Date  . Anemia   . Ankle fracture   . Arthritis   . Blood transfusion without reported diagnosis   . Breast cancer ([place])   . Cancer ([place])   . Diabetes mellitus without complication ([place])   . Dialysis patient ([place])    mon, wed, friday,   . End stage renal disease on dialysis [facility])    M/W/F [facility] in [place]  . GERD (gastroesophageal reflux disease)   . Hypertension   . OSA (obstructive sleep apnea)    uses CPAP sometimes  . Pneumonia   . PONV (postoperative nausea and vomiting)   . Wears glasses     Past Surgical History:  Procedure Laterality Date  . ABDOMINAL HYSTERECTOMY    . AV FISTULA PLACEMENT  11/2014   at [facility] N/A 07/10/2016   Procedure: BALLOON DILATION;  Surgeon: Danie Binder, MD;  Location: [facility];  Service: Endoscopy;  Laterality: N/A;  Pyloric dilation  . BREAST LUMPECTOMY    . CESAREAN SECTION    . CHOLECYSTECTOMY    . COLONOSCOPY WITH PROPOFOL N/A 09/27/2016   Dr. Gala Romney: Internal hemorrhoids repeat colonoscopy in 10 years  . DILATION AND CURETTAGE OF UTERUS    . ESOPHAGOGASTRODUODENOSCOPY N/A 07/10/2016   Dr.Fields- normal esophagus, gastric stenosis was found at the pylorus, gastritis on bx, normal  examined duodenun  . EXTERNAL FIXATION REMOVAL Right 10/29/2018   Procedure: REMOVAL RIGHT ANKLE BIOMET ZIMMER EXTERNAL FIXATOR, SHORT LEG CAST APPLICATION;  Surgeon: Marybelle Killings, MD;  Location: [place];  Service: Orthopedics;  Laterality: Right;  . MASTECTOMY     left sided  . ORIF ANKLE FRACTURE Right 10/06/2018   Procedure: OPEN REDUCTION INTERNAL FIXATION (ORIF) RIGHT ANKLE TRIMALLEOLAR;  Surgeon: Marybelle Killings, MD;  Location: [place];  Service: Orthopedics;  Laterality: Right;    Family History  Problem Relation Age of Onset  . Diabetes Mellitus II Mother   . Hypertension Mother   . Hypertension Sister   . Hypertension Sister   . Colon cancer Neg Hx     Social History   Socioeconomic History  . Marital status: Widowed    Spouse name: Not on file  . Number of children: Not on file  . Years of education: Not on file  . Highest education level: Not on file  Occupational History  . Not on file  Tobacco Use  . Smoking status: Never Smoker  . Smokeless tobacco: Never Used  Substance and Sexual Activity  . Alcohol use: No    Alcohol/week: 0.0 standard drinks  . Drug use: No  . Sexual activity: Not on file  Other Topics Concern  . Not on file  Social History Narrative  . Not on file  Social Determinants of Health   Financial Resource Strain:   . Difficulty of Paying Living Expenses: Not on file  Food Insecurity:   . Worried About Charity fundraiser in the Last Year: Not on file  . Ran Out of Food in the Last Year: Not on file  Transportation Needs:   . Lack of Transportation (Medical): Not on file  . Lack of Transportation (Non-Medical): Not on file  Physical Activity:   . Days of Exercise per Week: Not on file  . Minutes of Exercise per Session: Not on file  Stress:   . Feeling of Stress : Not on file  Social Connections:   . Frequency of Communication with Friends and Family: Not on file  . Frequency of Social Gatherings with Friends and Family: Not on file  .  Attends Religious Services: Not on file  . Active Member of Clubs or Organizations: Not on file  . Attends Archivist Meetings: Not on file  . Marital Status: Not on file  Intimate Partner Violence:   . Fear of Current or Ex-Partner: Not on file  . Emotionally Abused: Not on file  . Physically Abused: Not on file  . Sexually Abused: Not on file     Physical Exam   Vitals:   01/11/20 0008 01/11/20 0030  BP:  (!) 190/96  Pulse:  (!) 113  Resp:  20  Temp:    SpO2: 100% 100%    CONSTITUTIONAL: Ill-appearing, diaphoretic, in severe respiratory distress NEURO: Awake, intermittently answering yes/no questions, conversant, moving all extremities EYES:  eyes equal and reactive ENT/NECK:  no LAD, no JVD CARDIO: Tachycardic rate, well-perfused, normal S1 and S2 PULM:  CTAB no wheezing or rhonchi GI/GU:  normal bowel sounds, non-distended, non-tender MSK/SPINE:  No gross deformities, no edema SKIN:  no rash, atraumatic PSYCH:  Appropriate speech and behavior  *Additional and/or pertinent findings included in MDM below  Diagnostic and Interventional Summary    EKG Interpretation  Date/Time:  Sunday January 10 2020 23:52:15 EST Ventricular Rate:  125 PR Interval:    QRS Duration: 81 QT Interval:  341 QTC Calculation: 492 R Axis:   87 Text Interpretation: Sinus tachycardia Ventricular premature complex Repol abnrm suggests ischemia, diffuse leads Confirmed by Gerlene Fee [phone]) on 01/11/2020 12:05:28 AM      Cardiac Monitoring Interpretation:  Labs Reviewed  CBC - Abnormal; Notable for the following components:      Result Value   WBC 14.9 (*)    RBC 2.31 (*)    Hemoglobin 6.9 (*)    HCT 22.6 (*)    All other components within normal limits  COMPREHENSIVE METABOLIC PANEL - Abnormal; Notable for the following components:   CO2 20 (*)    Glucose, Bld 382 (*)    BUN 75 (*)    Creatinine, Ser 11.06 (*)    ALT 59 (*)    GFR calc non Af Amer 4 (*)    GFR calc  Af Amer 4 (*)    All other components within normal limits  BRAIN NATRIURETIC PEPTIDE - Abnormal; Notable for the following components:   B Natriuretic Peptide 191.0 (*)    All other components within normal limits  LACTIC ACID, PLASMA - Abnormal; Notable for the following components:   Lactic Acid, Venous 2.3 (*)    All other components within normal limits  BLOOD GAS, VENOUS - Abnormal; Notable for the following components:   pO2, Ven 45.2 (*)    Bicarbonate  19.3 (*)    Acid-base deficit 5.7 (*)    All other components within normal limits  TROPONIN I (HIGH SENSITIVITY) - Abnormal; Notable for the following components:   Troponin I (High Sensitivity) 33 (*)    All other components within normal limits  RESPIRATORY PANEL BY RT PCR (FLU A&B, COVID)  CBC  PROTIME-INR  COMPREHENSIVE METABOLIC PANEL  I-STAT CHEM 8, ED    DG Chest Port 1 View  Final Result      Medications  nitroGLYCERIN (NITROGLYN) 2 % ointment 1 inch (has no administration in time range)  enoxaparin (LOVENOX) injection 30 mg (has no administration in time range)  acetaminophen (TYLENOL) tablet 650 mg (has no administration in time range)    Or  acetaminophen (TYLENOL) suppository 650 mg (has no administration in time range)  ondansetron (ZOFRAN) tablet 4 mg (has no administration in time range)    Or  ondansetron (ZOFRAN) injection 4 mg (has no administration in time range)  furosemide (LASIX) 160 mg in dextrose 5 % 50 mL IVPB (has no administration in time range)     Procedures  /  Critical Care .Critical Care Performed by: Maudie Flakes, MD Authorized by: Maudie Flakes, MD   Critical care provider statement:    Critical care time (minutes):  35   Critical care was necessary to treat or prevent imminent or life-threatening deterioration of the following conditions:  Respiratory failure   Critical care was time spent personally by me on the following activities:  Discussions with consultants,  evaluation of patient's response to treatment, examination of patient, ordering and performing treatments and interventions, ordering and review of laboratory studies, ordering and review of radiographic studies, pulse oximetry, re-evaluation of patient's condition, obtaining history from patient or surrogate and review of old charts Ultrasound ED Peripheral IV (Provider)  Date/Time: 01/11/2020 12:05 AM Performed by: Maudie Flakes, MD Authorized by: Maudie Flakes, MD   Procedure details:    Indications: multiple failed IV attempts and poor IV access     Skin Prep: chlorhexidine gluconate     Location: Right basilic vein.   Angiocath:  20 G   Bedside Ultrasound Guided: Yes     Patient tolerated procedure without complications: Yes     Dressing applied: Yes      ED Course and Medical Decision Making  I have reviewed the triage vital signs, the nursing notes, and pertinent available records from the EMR.  Pertinent labs & imaging results that were available during my care of the patient were reviewed by me and considered in my medical decision making (see below for details).     Concern for CHF exacerbation versus fluid overload state in the setting of ESRD versus PE versus ACS, favoring pulmonary etiology given patient's severe tachypnea, hypoxia.  Recent admission for COVID-19 with hyperkalemia.  EKG reveals sinus tachycardia.  Patient placed on BiPAP and demonstrating significant clinical improvement.  Update 1:13 AM: Continued improvement on BiPAP, x-ray largely confirming pulmonary edema.  Discussed case with Dr. Johnney Ou of [place] [facility].  Given the improvement, patient would be appropriate for overnight stay at [facility] for dialysis in the morning.  Accepted for admission by Dr. Olevia Bowens.  Barth Kirks. Sedonia Small, MD [place] [email or web address]  Final Clinical Impressions(s) / ED Diagnoses     ICD-10-CM   1. SOB (shortness of  breath)  R06.02   2. Acute pulmonary edema (HCC)  J81.0   3. Acute respiratory  failure with hypoxia ([place])  J96.01     ED Discharge Orders    None       Discharge Instructions Discussed with and Provided to Patient:   Discharge Instructions   None       Maudie Flakes, MD 01/11/20 [phone]

## 2020-01-11 NOTE — Progress Notes (Signed)
Patient is complaining of cramping. Administered 158ml bolus of 0.9% sodium chloride per order

## 2020-01-11 NOTE — Procedures (Signed)
I was present at this dialysis session. I have reviewed the session itself and made appropriate changes.   Vital signs in last 24 hours:  Temp:  [97.9 F (36.6 C)-98 F (36.7 C)] 97.9 F (36.6 C) (01/25 0945) Pulse Rate:  [43-122] 88 (01/25 1000) Resp:  [16-34] 18 (01/25 1000) BP: (148-222)/(72-114) 179/109 (01/25 1000) SpO2:  [77 %-100 %] 100 % (01/25 1000) FiO2 (%):  [60 %-90 %] 60 % (01/25 0306) Weight:  [115.4 kg] 115.4 kg (01/24 2352) Weight change:  Filed Weights   01/10/20 2352  Weight: 115.4 kg    Recent Labs  Lab 01/11/20 0010  NA 138  K 4.2  CL 103  CO2 20*  GLUCOSE 382*  BUN 75*  CREATININE 11.06*  CALCIUM 9.3    Recent Labs  Lab 01/11/20 0010  WBC 14.9*  HGB 6.9*  HCT 22.6*  MCV 97.8  PLT 249    Scheduled Meds: . sodium chloride   Intravenous Once  . Chlorhexidine Gluconate Cloth  6 each Topical Q0600  . cinacalcet  30 mg Oral Q breakfast  . ferric citrate  420 mg Oral BID WC  . fluticasone  1 puff Inhalation BID  . furosemide  40-80 mg Oral BID  . glipiZIDE  5 mg Oral BID AC  . heparin  1,000 Units Dialysis Once in dialysis  . insulin aspart  0-9 Units Subcutaneous TID WC  . nitroGLYCERIN  1 inch Topical Q6H  . pantoprazole  40 mg Oral Daily  . Plecanatide  3 mg Oral Daily  . rOPINIRole  2 mg Oral QHS  . zinc sulfate  220 mg Oral Daily   Continuous Infusions: . sodium chloride    . sodium chloride     PRN Meds:.sodium chloride, sodium chloride, acetaminophen **OR** acetaminophen, albuterol, lidocaine (PF), lidocaine-prilocaine, ondansetron **OR** ondansetron (ZOFRAN) IV, pentafluoroprop-tetrafluoroeth, polyethylene glycol   Donetta Potts,  MD 01/11/2020, 10:07 AM

## 2020-01-11 NOTE — ED Notes (Addendum)
Date and time results received: 01/11/20 0027  Test: Hgb Critical Value: 6.9  Name of Provider Notified: Sedonia Small, MD  Orders Received? Or Actions Taken?: acknowledged

## 2020-01-12 LAB — GLUCOSE, CAPILLARY
Glucose-Capillary: 204 mg/dL — ABNORMAL HIGH (ref 70–99)
Glucose-Capillary: 227 mg/dL — ABNORMAL HIGH (ref 70–99)
Glucose-Capillary: 108 mg/dL — ABNORMAL HIGH (ref 70–99)
Glucose-Capillary: 114 mg/dL — ABNORMAL HIGH (ref 70–99)

## 2020-01-12 LAB — COMPREHENSIVE METABOLIC PANEL
ALT: 42 U/L (ref 0–44)
AST: 19 U/L (ref 15–41)
Albumin: 3.6 g/dL (ref 3.5–5.0)
Alkaline Phosphatase: 76 U/L (ref 38–126)
Anion gap: 16 — ABNORMAL HIGH (ref 5–15)
BUN: 48 mg/dL — ABNORMAL HIGH (ref 6–20)
CO2: 24 mmol/L (ref 22–32)
Calcium: 9.1 mg/dL (ref 8.9–10.3)
Chloride: 97 mmol/L — ABNORMAL LOW (ref 98–111)
Creatinine, Ser: 7.8 mg/dL — ABNORMAL HIGH (ref 0.44–1.00)
GFR calc Af Amer: 6 mL/min — ABNORMAL LOW (ref >60)
GFR calc non Af Amer: 5 mL/min — ABNORMAL LOW (ref >60)
Glucose, Bld: 217 mg/dL — ABNORMAL HIGH (ref 70–99)
Potassium: 3.8 mmol/L (ref 3.5–5.1)
Sodium: 137 mmol/L (ref 135–145)
Total Bilirubin: 0.8 mg/dL (ref 0.3–1.2)
Total Protein: 7 g/dL (ref 6.5–8.1)

## 2020-01-12 LAB — CBC
HCT: 24.1 % — ABNORMAL LOW (ref 36.0–46.0)
Hemoglobin: 7.5 g/dL — ABNORMAL LOW (ref 12.0–15.0)
MCH: 29.9 pg (ref 26.0–34.0)
MCHC: 31.1 g/dL (ref 30.0–36.0)
MCV: 96 fL (ref 80.0–100.0)
Platelets: 171 10*3/uL (ref 150–400)
RBC: 2.51 MIL/uL — ABNORMAL LOW (ref 3.87–5.11)
RDW: 15.8 % — ABNORMAL HIGH (ref 11.5–15.5)
WBC: 9.6 10*3/uL (ref 4.0–10.5)
nRBC: 0 % (ref 0.0–0.2)

## 2020-01-12 LAB — PROTIME-INR
INR: 1.1 (ref 0.8–1.2)
Prothrombin Time: 14.3 seconds (ref 11.4–15.2)

## 2020-01-12 LAB — PHOSPHORUS: Phosphorus: 4.6 mg/dL (ref 2.5–4.6)

## 2020-01-12 MED ORDER — DARBEPOETIN ALFA 100 MCG/0.5ML IJ SOSY
200.0000 ug | PREFILLED_SYRINGE | INTRAMUSCULAR | Status: DC
  Administered 2020-01-13: 200 ug via INTRAVENOUS
  Filled 2020-01-12 (×2): qty 1

## 2020-01-12 MED ORDER — INSULIN ASPART 100 UNIT/ML ~~LOC~~ SOLN
4.0000 [IU] | Freq: Three times a day (TID) | SUBCUTANEOUS | Status: DC
  Administered 2020-01-12 – 2020-01-15 (×7): 4 [IU] via SUBCUTANEOUS

## 2020-01-12 MED ORDER — GUAIFENESIN ER 600 MG PO TB12
600.0000 mg | ORAL_TABLET | Freq: Two times a day (BID) | ORAL | Status: DC | PRN
  Administered 2020-01-12: 600 mg via ORAL
  Filled 2020-01-12: qty 1

## 2020-01-12 NOTE — Progress Notes (Signed)
Patient ID: Patricia [name], female   DOB: 1971-02-10, 49 y.o.   MRN: [id number] S: Feels better today and breathing easier but still on oxygen.  No fevers or chills or productive cough. O:BP (!) 157/74 (BP Location: Right Arm)   Pulse 92   Temp 98 F (36.7 C) (Oral)   Resp 20   Ht 5\' 3"  (1.6 m)   Wt 115.4 kg   SpO2 100%   BMI 45.07 kg/m   Intake/Output Summary (Last 24 hours) at 01/12/2020 1001 Last data filed at 01/12/2020 0900 Gross per 24 hour  Intake 895 ml  Output 3053 ml  Net -2158 ml   Intake/Output: I/O last 3 completed shifts: In: 719.7 [P.O.:240; I.V.:100; Blood:315; IV Piggyback:64.7] Out: 1962 [Other:3053]  Intake/Output this shift:  Total I/O In: 240 [P.O.:240] Out: -  Weight change:  Gen: WD obese AAF lying in bed in NAD CVS: no rub Resp: CTA Abd: +BS, soft, NT/ND Ext: no edema, LUE AVF +T/B  Recent Labs  Lab 01/11/20 0010 01/12/20 0608  NA 138 137  K 4.2 3.8  CL 103 97*  CO2 20* 24  GLUCOSE 382* 217*  BUN 75* 48*  CREATININE 11.06* 7.80*  ALBUMIN 3.9 3.6  CALCIUM 9.3 9.1  PHOS  --  4.6  AST 29 19  ALT 59* 42   Liver Function Tests: Recent Labs  Lab 01/11/20 0010 01/12/20 0608  AST 29 19  ALT 59* 42  ALKPHOS 95 76  BILITOT 0.8 0.8  PROT 7.2 7.0  ALBUMIN 3.9 3.6   No results for input(s): LIPASE, AMYLASE in the last 168 hours. No results for input(s): AMMONIA in the last 168 hours. CBC: Recent Labs  Lab 01/11/20 0010 01/12/20 0608  WBC 14.9* 9.6  HGB 6.9* 7.5*  HCT 22.6* 24.1*  MCV 97.8 96.0  PLT 249 171   Cardiac Enzymes: No results for input(s): CKTOTAL, CKMB, CKMBINDEX, TROPONINI in the last 168 hours. CBG: Recent Labs  Lab 01/11/20 0808 01/11/20 1608 01/11/20 2018 01/12/20 0737  GLUCAP 279* 168* 151* 204*    Iron Studies: No results for input(s): IRON, TIBC, TRANSFERRIN, FERRITIN in the last 72 hours. Studies/Results: DG Chest Port 1 View  Result Date: 01/11/2020 CLINICAL DATA:  Shortness of breath, worsening  tonight EXAM: PORTABLE CHEST 1 VIEW COMPARISON:  Radiograph 12/29/2019, 05/23/2019 FINDINGS: Bilateral basilar and perihilar predominant hazy airspace opacities with indistinctness and cephalization of the pulmonary vascularity. Cardiomegaly is increased from comparison portable radiograph. The hemidiaphragms are partially obscured which could reflect either consolidation at the lung bases or layering pleural fluid. No pneumothorax. Postsurgical changes in the left chest wall are similar to prior. No acute osseous or soft tissue abnormality. Telemetry leads overlie the chest. IMPRESSION: Findings most consistent with CHF with pulmonary edema and increasing cardiomegaly. Obscuration of the hemidiaphragms could reflect some airspace disease in the lung bases or layering pleural fluid. Notably, infection could have a similar appearance in the appropriate clinical setting. Electronically Signed   By: Lovena Le M.D.   On: 01/11/2020 00:22   . sodium chloride   Intravenous Once  . Chlorhexidine Gluconate Cloth  6 each Topical Q0600  . cinacalcet  30 mg Oral Q breakfast  . ferric citrate  420 mg Oral BID WC  . furosemide  40-80 mg Oral BID  . heparin  1,000 Units Dialysis Once in dialysis  . insulin aspart  0-9 Units Subcutaneous TID WC  . insulin aspart  3 Units Subcutaneous TID WC  . NIFEdipine  60 mg Oral QPM  . pantoprazole  40 mg Oral Daily  . Plecanatide  3 mg Oral Daily  . rOPINIRole  2 mg Oral QHS  . sevelamer carbonate  1,600 mg Oral BID WC  . sevelamer carbonate  2,400 mg Oral Q lunch  . zinc sulfate  220 mg Oral Daily    BMET    Component Value Date/Time   NA 137 01/12/2020 0608   K 3.8 01/12/2020 0608   CL 97 (L) 01/12/2020 0608   CO2 24 01/12/2020 0608   GLUCOSE 217 (H) 01/12/2020 0608   BUN 48 (H) 01/12/2020 0608   CREATININE 7.80 (H) 01/12/2020 0608   CREATININE 5.93 (H) 06/29/2019 1210   CALCIUM 9.1 01/12/2020 0608   GFRNONAA 5 (L) 01/12/2020 0608   GFRNONAA 8 (L)  06/29/2019 1210   GFRAA 6 (L) 01/12/2020 0608   GFRAA 9 (L) 06/29/2019 1210   CBC    Component Value Date/Time   WBC 9.6 01/12/2020 0608   RBC 2.51 (L) 01/12/2020 0608   HGB 7.5 (L) 01/12/2020 0608   HCT 24.1 (L) 01/12/2020 0608   PLT 171 01/12/2020 0608   MCV 96.0 01/12/2020 0608   MCH 29.9 01/12/2020 0608   MCHC 31.1 01/12/2020 0608   RDW 15.8 (H) 01/12/2020 0608   LYMPHSABS 1.8 01/02/2020 0547   MONOABS 0.5 01/02/2020 0547   EOSABS 0.1 01/02/2020 0547   BASOSABS 0.0 01/02/2020 0547    Dialysis Orders: Outpt [facility] [place] MWF  4h 36min 400/600 110.5kg Hep 1000+ 1800/hr 2/2.5 bath L AVF  Assessment/Plan: 1.  Acute on chronic hypoxic respiratory failure- with evidence of volume overload and anemia.  Already on HD and will UF as tolerated.  Her edw was recently challenged and admits to poor appetite since her diagnosis of covid.  Improved with UF but still requiring 4 liters oxygen via [place].  She did cramp afterwards and was only 3 liters net negative.  Plan for HD again tomorrow and challenge as tolerated 2.  ESRD -  Cont with hd qMWF 3.  Hypertension/volume  - UF as tolerated and cont to challenge edw 4.  Anemia  - s/p transfusion of 1 unit prbc with HD 01/11/20.  Likely contributing to her symptoms 5.  Metabolic bone disease -  Cont with home meds 6.  Nutrition -  Renal diet, carb modified 7. covid-19- diagnosed 12/13/19 and s/p remdesivir/decadron.  Not certain +PCR means anything as she is not febrile and SOB likely related to volume and not pneumonia.   Donetta Potts, MD [employer] [phone]

## 2020-01-12 NOTE — Progress Notes (Signed)
PROGRESS NOTE Patricia [name]  NFA:[id number]  DOB: [birth year]  DOA: 01/10/2020 PCP: Jake Samples, PA-C   Brief Admission Hx: 49 y.o. female with medical history significant of anemia, ankle fracture, osteoarthritis, history of blood transfusions, breast cancer, type 2 diabetes, ESRD on hemodialysis, GERD, hypertension, OSA on CPAP which she uses sometimes, history of pneumonia who is coming to the emergency department with progressively worse dyspnea, volume overload and hypertensive urgency.  MDM/Assessment & Plan:   1. Pulmonary edema/volume overload-patient received emergency hemodialysis on 01/11/2020 and 3 L were removed.  She is starting to feel better.  She remains on oxygen at this time and is going to need additional HD treatment.  Continue supportive therapy and nephrology is planning for further HD tomorrow 01/13/2020. 2. End-stage renal disease on hemodialysis-further HD planned for 01/13/2020 per nephrology team. 3. Recent COVID-19 infection-patient completed treatment for COVID-19, per Dr. Baxter Flattery does not require isolation.   4. Hypertensive urgency -likely secondary to volume overload treating with volume removal with hemodialysis. 5. Anemia of chronic kidney disease-patient had hemoglobin down to 6.6 upon admission and was transfused PRBCs during hemodialysis.  Hemoglobin is improved to 7.5. 6. OSA-BiPAP as needed. 7. Lactic acidosis-likely secondary to hypoxia which is now resolved.  DVT prophylaxis: SCDs Code Status: Full Family Communication: Patient determined to have full capacity and updated at bedside and verbalized understanding Disposition Plan: Continue inpatient management and require further HD treatments 01/13/2020 and oxygen support   Consultants:  Nephrology   Procedures:  HD  Antimicrobials:     Subjective: Pt says she is having aches all over body since HD yesterday otherwise her breathing is slightly improved today.    Objective: Vitals:   01/11/20 1311 01/11/20 1942 01/11/20 2148 01/12/20 0636  BP: (!) 173/88  (!) 160/74 (!) 157/74  Pulse: 86  99 92  Resp: 16  20 20   Temp: 98.2 F (36.8 C)  99.2 F (37.3 C) 98 F (36.7 C)  TempSrc: Oral  Oral Oral  SpO2: 97% 97% 97% 100%  Weight:      Height:        Intake/Output Summary (Last 24 hours) at 01/12/2020 1056 Last data filed at 01/12/2020 0900 Gross per 24 hour  Intake 580 ml  Output 3053 ml  Net -2473 ml   Filed Weights   01/10/20 2352  Weight: 115.4 kg   REVIEW OF SYSTEMS  As per history otherwise all reviewed and reported negative  Exam:  General exam: awake, alert, cooperative, morbidly obese, NAD.  Respiratory system:  No increased work of breathing. Cardiovascular system: normal S1 & S2 heard.  Gastrointestinal system: Abdomen is nondistended, soft and nontender. Normal bowel sounds heard. Central nervous system: Alert and oriented. No focal neurological deficits. Extremities: 1+ edema BLEs.   Data Reviewed: Basic Metabolic Panel: Recent Labs  Lab 01/11/20 0010 01/12/20 0608  NA 138 137  K 4.2 3.8  CL 103 97*  CO2 20* 24  GLUCOSE 382* 217*  BUN 75* 48*  CREATININE 11.06* 7.80*  CALCIUM 9.3 9.1  PHOS  --  4.6   Liver Function Tests: Recent Labs  Lab 01/11/20 0010 01/12/20 0608  AST 29 19  ALT 59* 42  ALKPHOS 95 76  BILITOT 0.8 0.8  PROT 7.2 7.0  ALBUMIN 3.9 3.6   No results for input(s): LIPASE, AMYLASE in the last 168 hours. No results for input(s): AMMONIA in the last 168 hours. CBC: Recent Labs  Lab 01/11/20 0010 01/12/20  8016  WBC 14.9* 9.6  HGB 6.9* 7.5*  HCT 22.6* 24.1*  MCV 97.8 96.0  PLT 249 171   Cardiac Enzymes: No results for input(s): CKTOTAL, CKMB, CKMBINDEX, TROPONINI in the last 168 hours. CBG (last 3)  Recent Labs    01/11/20 1608 01/11/20 2018 01/12/20 0737  GLUCAP 168* 151* 204*   Recent Results (from the past 240 hour(s))  Respiratory Panel by RT PCR (Flu A&B, Covid) -  Nasopharyngeal Swab     Status: Abnormal   Collection Time: 01/11/20 12:11 AM   Specimen: Nasopharyngeal Swab  Result Value Ref Range Status   SARS Coronavirus 2 by RT PCR POSITIVE (A) NEGATIVE Final    Comment: RESULT CALLED TO, READ BACK BY AND VERIFIED WITH: ANDREW,L @ 5537 ON 01/11/20 BY JUW    Influenza A by PCR NEGATIVE NEGATIVE Final   Influenza B by PCR NEGATIVE NEGATIVE Final    Comment: (NOTE) The Xpert Xpress SARS-CoV-2/FLU/RSV assay is intended as an aid in  the diagnosis of influenza from Nasopharyngeal swab specimens and  should not be used as a sole basis for treatment. Nasal washings and  aspirates are unacceptable for Xpert Xpress SARS-CoV-2/FLU/RSV  testing. Fact Sheet for Patients: PinkCheek.be Fact Sheet for Healthcare Providers: GravelBags.it This test is not yet approved or cleared by the [place] FDA and  has been authorized for detection and/or diagnosis of SARS-CoV-2 by  FDA under an Emergency Use Authorization (EUA). This EUA will remain  in effect (meaning this test can be used) for the duration of the  Covid-19 declaration under Section 564(b)(1) of the Act, 21  U.S.C. section 360bbb-3(b)(1), unless the authorization is  terminated or revoked. Performed at [facility], [address] [place], [place] [address]      Studies: DG Chest [facility] 1 View  Result Date: 01/11/2020 CLINICAL DATA:  Shortness of breath, worsening tonight EXAM: PORTABLE CHEST 1 VIEW COMPARISON:  Radiograph 12/29/2019, 05/23/2019 FINDINGS: Bilateral basilar and perihilar predominant hazy airspace opacities with indistinctness and cephalization of the pulmonary vascularity. Cardiomegaly is increased from comparison portable radiograph. The hemidiaphragms are partially obscured which could reflect either consolidation at the lung bases or layering pleural fluid. No pneumothorax. Postsurgical changes in the left chest wall are  similar to prior. No acute osseous or soft tissue abnormality. Telemetry leads overlie the chest. IMPRESSION: Findings most consistent with CHF with pulmonary edema and increasing cardiomegaly. Obscuration of the hemidiaphragms could reflect some airspace disease in the lung bases or layering pleural fluid. Notably, infection could have a similar appearance in the appropriate clinical setting. Electronically Signed   By: Lovena Le M.D.   On: 01/11/2020 00:22   Scheduled Meds: . sodium chloride   Intravenous Once  . Chlorhexidine Gluconate Cloth  6 each Topical Q0600  . cinacalcet  30 mg Oral Q breakfast  . [START ON 01/13/2020] darbepoetin (ARANESP) injection - DIALYSIS  200 mcg Intravenous Q Wed-HD  . ferric citrate  420 mg Oral BID WC  . furosemide  40-80 mg Oral BID  . heparin  1,000 Units Dialysis Once in dialysis  . insulin aspart  0-9 Units Subcutaneous TID WC  . insulin aspart  3 Units Subcutaneous TID WC  . NIFEdipine  60 mg Oral QPM  . pantoprazole  40 mg Oral Daily  . Plecanatide  3 mg Oral Daily  . rOPINIRole  2 mg Oral QHS  . sevelamer carbonate  1,600 mg Oral BID WC  . sevelamer carbonate  2,400 mg Oral  Q lunch  . zinc sulfate  220 mg Oral Daily   Continuous Infusions: . sodium chloride    . sodium chloride Stopped (01/11/20 1600)    Principal Problem:   Acute pulmonary edema (HCC) Active Problems:   End stage renal disease on dialysis (HCC)   Type 2 diabetes mellitus (HCC)   Essential hypertension   GERD (gastroesophageal reflux disease)   Volume overload   Lactic acidosis   Anemia   Elevated troponin   OSA (obstructive sleep apnea)  Time spent:   Irwin Brakeman, MD [facility] 01/12/2020, 10:56 AM    LOS: 1 day  How to contact the [facility] Attending or Consulting provider [place] or covering provider during after hours [place], for this patient?  1. Check the care team in [facility] and look for a) attending/consulting TRH provider listed and b) the [facility] team  listed 2. Log into [email or web address] and use [place]'s universal password to access. If you do not have the password, please contact the hospital operator. 3. Locate the [facility] provider you are looking for under [facility] and page to a number that you can be directly reached. 4. If you still have difficulty reaching the provider, please page the [facility] (Director on Call) for the Hospitalists listed on amion for assistance.

## 2020-01-12 NOTE — TOC Initial Note (Signed)
Transition of Care [facility]) - Initial/Assessment Note    Patient Details  Name: Patricia [name] MRN: [id number] Date of Birth: Oct 16, 1971  Transition of Care [facility]) CM/SW Contact:    Boneta Lucks, RN Phone Number: 01/12/2020, 1:14 PM  Clinical Narrative:      Patient admitted for acute pulmonary edema. Patient also has a high risk readmission score. Dialysis patient, unable to reach. Called her mother Patricia [name].  She states patient lives at home with her two sons, she drives herself to appointments. She does not use any DME aides. Patient has been on home oxygen seasonally.  TOC to follow for any needs at discharge.              Expected Discharge Plan: Home/Self Care Barriers to Discharge: Continued Medical Work up   Patient Goals and CMS Choice Patient states their goals for this hospitalization and ongoing recovery are:: to go home.     Expected Discharge Plan and Services Expected Discharge Plan: Home/Self Care    Living arrangements for the past 2 months: Single Family Home      Prior Living Arrangements/Services Living arrangements for the past 2 months: Single Family Home Lives with:: Minor Children          Need for Family Participation in Patient Care: Yes (Comment) Care giver support system in place?: Yes (comment)   Criminal Activity/Legal Involvement Pertinent to Current Situation/Hospitalization: No - Comment as needed  Activities of Daily Living Home Assistive Devices/Equipment: None ADL Screening (condition at time of admission) Patient's cognitive ability adequate to safely complete daily activities?: Yes Is the patient deaf or have difficulty hearing?: No Does the patient have difficulty seeing, even when wearing glasses/contacts?: No Does the patient have difficulty concentrating, remembering, or making decisions?: No Patient able to express need for assistance with ADLs?: Yes Does the patient have difficulty dressing or bathing?: No Independently performs  ADLs?: Yes (appropriate for developmental age) Does the patient have difficulty walking or climbing stairs?: No Weakness of Legs: Both Weakness of Arms/Hands: Both   Orientation: : Oriented to Self, Oriented to Place, Oriented to  Time, Oriented to Situation Alcohol / Substance Use: Not Applicable Psych Involvement: No (comment)  Admission diagnosis:  Acute pulmonary edema (HCC) [J81.0] SOB (shortness of breath) [R06.02] Volume overload [E87.70] Acute respiratory failure with hypoxia ([place]) [J96.01] Patient Active Problem List   Diagnosis Date Noted  . Acute pulmonary edema ([place]) 01/11/2020  . OSA (obstructive sleep apnea)   . Closed right ankle fracture 10/06/2018  . Closed displaced trimalleolar fracture of right ankle 10/06/2018  . Acute colitis 02/26/2018  . Hypoxemia 02/24/2018  . Dyspnea 11/26/2017  . Anemia 11/26/2017  . Elevated troponin 11/26/2017  . Hyperkalemia 09/29/2017  . Acute respiratory failure ([place]) 09/28/2017  . CAP (community acquired pneumonia) 09/26/2017  . Volume overload 09/25/2017  . Acute on chronic diastolic CHF (congestive heart failure) ([place]) 09/25/2017  . Lactic acidosis 09/25/2017  . Hypokalemia 09/25/2017  . Breast cancer ([place]) 07/22/2017  . SOB (shortness of breath)   . Acute respiratory failure with hypoxia ([place]) 07/21/2017  . Flatulence 10/25/2016  . GERD (gastroesophageal reflux disease) 08/02/2016  . Pyloric stenosis, acquired   . Nausea with vomiting   . Generalized abdominal pain   . Chest pain, rule out acute myocardial infarction 07/04/2016  . Constipation 07/04/2016  . Nausea 07/04/2016  . Essential hypertension 07/04/2016  . HCAP (healthcare-associated pneumonia) 12/04/2015  . End stage renal disease on dialysis ([place]) 12/04/2015  . Type 2  diabetes mellitus ([place]) 12/04/2015   PCP:  Jake Samples, PA-C Pharmacy:   [facility] [address] [place] - [place] [address] [place]  [address] Phone: [phone] Fax: [phone]  [id number] - [place], [address] [place] [address] [place] [address] Phone: [phone] Fax: [phone]     Social Determinants of Health (SDOH) Interventions    Readmission Risk Interventions Readmission Risk Prevention Plan 01/12/2020  Transportation Screening Complete  PCP or Specialist Appt within 3-5 Days Not Complete  HRI or [place] Complete  Social Work Consult for [place] Planning/Counseling Complete  Palliative Care Screening Not Complete  Medication Review Press photographer) Complete  Some recent data might be hidden

## 2020-01-12 NOTE — Plan of Care (Signed)

## 2020-01-13 LAB — GLUCOSE, CAPILLARY
Glucose-Capillary: 176 mg/dL — ABNORMAL HIGH (ref 70–99)
Glucose-Capillary: 143 mg/dL — ABNORMAL HIGH (ref 70–99)
Glucose-Capillary: 80 mg/dL (ref 70–99)
Glucose-Capillary: 116 mg/dL — ABNORMAL HIGH (ref 70–99)

## 2020-01-13 LAB — RENAL FUNCTION PANEL
Albumin: 3.6 g/dL (ref 3.5–5.0)
Albumin: 3.3 g/dL — ABNORMAL LOW (ref 3.5–5.0)
Anion gap: 17 — ABNORMAL HIGH (ref 5–15)
Anion gap: 18 — ABNORMAL HIGH (ref 5–15)
BUN: 62 mg/dL — ABNORMAL HIGH (ref 6–20)
BUN: 62 mg/dL — ABNORMAL HIGH (ref 6–20)
CO2: 25 mmol/L (ref 22–32)
CO2: 24 mmol/L (ref 22–32)
Calcium: 9.4 mg/dL (ref 8.9–10.3)
Calcium: 9.2 mg/dL (ref 8.9–10.3)
Chloride: 94 mmol/L — ABNORMAL LOW (ref 98–111)
Chloride: 93 mmol/L — ABNORMAL LOW (ref 98–111)
Creatinine, Ser: 10.5 mg/dL — ABNORMAL HIGH (ref 0.44–1.00)
Creatinine, Ser: 10.83 mg/dL — ABNORMAL HIGH (ref 0.44–1.00)
GFR calc Af Amer: 4 mL/min — ABNORMAL LOW (ref >60)
GFR calc Af Amer: 4 mL/min — ABNORMAL LOW (ref >60)
GFR calc non Af Amer: 4 mL/min — ABNORMAL LOW (ref >60)
GFR calc non Af Amer: 4 mL/min — ABNORMAL LOW (ref >60)
Glucose, Bld: 186 mg/dL — ABNORMAL HIGH (ref 70–99)
Glucose, Bld: 193 mg/dL — ABNORMAL HIGH (ref 70–99)
Phosphorus: 3.7 mg/dL (ref 2.5–4.6)
Phosphorus: 3.9 mg/dL (ref 2.5–4.6)
Potassium: 3.6 mmol/L (ref 3.5–5.1)
Potassium: 3.5 mmol/L (ref 3.5–5.1)
Sodium: 136 mmol/L (ref 135–145)
Sodium: 135 mmol/L (ref 135–145)

## 2020-01-13 LAB — CBC
HCT: 23.3 % — ABNORMAL LOW (ref 36.0–46.0)
HCT: 21.5 % — ABNORMAL LOW (ref 36.0–46.0)
Hemoglobin: 7.3 g/dL — ABNORMAL LOW (ref 12.0–15.0)
Hemoglobin: 7 g/dL — ABNORMAL LOW (ref 12.0–15.0)
MCH: 29.3 pg (ref 26.0–34.0)
MCH: 30.3 pg (ref 26.0–34.0)
MCHC: 31.3 g/dL (ref 30.0–36.0)
MCHC: 32.6 g/dL (ref 30.0–36.0)
MCV: 93.6 fL (ref 80.0–100.0)
MCV: 93.1 fL (ref 80.0–100.0)
Platelets: 214 10*3/uL (ref 150–400)
Platelets: 211 10*3/uL (ref 150–400)
RBC: 2.49 MIL/uL — ABNORMAL LOW (ref 3.87–5.11)
RBC: 2.31 MIL/uL — ABNORMAL LOW (ref 3.87–5.11)
RDW: 15.2 % (ref 11.5–15.5)
RDW: 15.4 % (ref 11.5–15.5)
WBC: 10 10*3/uL (ref 4.0–10.5)
WBC: 9.5 10*3/uL (ref 4.0–10.5)
nRBC: 0 % (ref 0.0–0.2)
nRBC: 0 % (ref 0.0–0.2)

## 2020-01-13 LAB — PREPARE RBC (CROSSMATCH)

## 2020-01-13 LAB — MAGNESIUM: Magnesium: 1.9 mg/dL (ref 1.7–2.4)

## 2020-01-13 MED ORDER — SODIUM CHLORIDE 0.9% IV SOLUTION: Freq: Once | INTRAVENOUS | Status: DC

## 2020-01-13 MED ORDER — SORBITOL 70 % SOLN
960.0000 mL | TOPICAL_OIL | Freq: Once | ORAL | Status: AC
  Administered 2020-01-13: 960 mL via RECTAL
  Filled 2020-01-13: qty 473

## 2020-01-13 MED ORDER — SALINE SPRAY 0.65 % NA SOLN: 1.0000 | NASAL | Status: DC | PRN

## 2020-01-13 MED ORDER — LACTULOSE 10 GM/15ML PO SOLN
20.0000 g | Freq: Two times a day (BID) | ORAL | Status: DC
  Administered 2020-01-13 – 2020-01-14 (×3): 20 g via ORAL
  Filled 2020-01-13 (×5): qty 30

## 2020-01-13 MED ORDER — HEPARIN SODIUM (PORCINE) 1000 UNIT/ML DIALYSIS
20.0000 [IU]/kg | INTRAMUSCULAR | Status: DC | PRN
  Filled 2020-01-13: qty 3

## 2020-01-13 MED ORDER — ROPINIROLE HCL 1 MG PO TABS
1.0000 mg | ORAL_TABLET | Freq: Two times a day (BID) | ORAL | Status: DC
  Administered 2020-01-13 – 2020-01-14 (×3): 1 mg via ORAL
  Filled 2020-01-13 (×5): qty 1

## 2020-01-13 NOTE — Plan of Care (Signed)
  Problem: Clinical Measurements: Goal: Respiratory complications will improve Outcome: Progressing  Pt able to tolerate oxygen being weaned down to 2L [place] with no evidence of shortness of breath and O2 sat maintained above 92% Problem: Elimination: Goal: Will not experience complications related to bowel motility Outcome: Not Met (add Reason)  Pt is complaining of abdominal fullness and inability to have a bowel movement for several days. Dr. Vonzella Nipple lactulose that the patient vomited up. The patient also has orders for a SMOG enema after dialysis.

## 2020-01-13 NOTE — Care Management Important Message (Signed)
Important Message  Patient Details  Name: Patricia [name] MRN: [id number] Date of Birth: [birth year]   Medicare Important Message Given:  Yes(Courtney, RN will deliver letter due to precautions)     Tommy Medal 01/13/2020, 4:00 PM

## 2020-01-13 NOTE — Progress Notes (Signed)
Patient ID: Patricia [name], female   DOB: February 16, 1971, 49 y.o.   MRN: [id number] S: complaining of abdominal pain and constipation O:BP (!) 185/86 (BP Location: Right Arm)   Pulse 98   Temp 98.2 F (36.8 C) (Oral)   Resp 20   Ht 5\' 3"  (1.6 m)   Wt 115.4 kg   SpO2 95%   BMI 45.07 kg/m   Intake/Output Summary (Last 24 hours) at 01/13/2020 1033 Last data filed at 01/13/2020 0700 Gross per 24 hour  Intake 720 ml  Output --  Net 720 ml   Intake/Output: I/O last 3 completed shifts: In: 960 [P.O.:960] Out: -   Intake/Output this shift:  No intake/output data recorded. Weight change:  Gen: obese AAF lying in bed in NAD CVS: no rub Resp: bibasilar crackles Abd: obese, +BS, soft, mildly tender in lower abdomen, no guarding or rebound Ext: no edema, LUE AVF +T/B  Recent Labs  Lab 01/11/20 0010 01/12/20 0608 01/13/20 0601 01/13/20 0843  NA 138 137 136 135  K 4.2 3.8 3.6 3.5  CL 103 97* 94* 93*  CO2 20* 24 25 24   GLUCOSE 382* 217* 186* 193*  BUN 75* 48* 62* 62*  CREATININE 11.06* 7.80* 10.50* 10.83*  ALBUMIN 3.9 3.6 3.6 3.3*  CALCIUM 9.3 9.1 9.4 9.2  PHOS  --  4.6 3.7 3.9  AST 29 19  --   --   ALT 59* 42  --   --    Liver Function Tests: Recent Labs  Lab 01/11/20 0010 01/11/20 0010 01/12/20 0608 01/13/20 0601 01/13/20 0843  AST 29  --  19  --   --   ALT 59*  --  42  --   --   ALKPHOS 95  --  76  --   --   BILITOT 0.8  --  0.8  --   --   PROT 7.2  --  7.0  --   --   ALBUMIN 3.9   < > 3.6 3.6 3.3*   < > = values in this interval not displayed.   No results for input(s): LIPASE, AMYLASE in the last 168 hours. No results for input(s): AMMONIA in the last 168 hours. CBC: Recent Labs  Lab 01/11/20 0010 01/11/20 0010 01/12/20 0608 01/13/20 0601 01/13/20 0843  WBC 14.9*   < > 9.6 10.0 9.5  HGB 6.9*   < > 7.5* 7.3* 7.0*  HCT 22.6*   < > 24.1* 23.3* 21.5*  MCV 97.8  --  96.0 93.6 93.1  PLT 249   < > 171 214 211   < > = values in this interval not displayed.    Cardiac Enzymes: No results for input(s): CKTOTAL, CKMB, CKMBINDEX, TROPONINI in the last 168 hours. CBG: Recent Labs  Lab 01/12/20 0737 01/12/20 1103 01/12/20 1622 01/12/20 2138 01/13/20 0742  GLUCAP 204* 227* 108* 114* 176*    Iron Studies: No results for input(s): IRON, TIBC, TRANSFERRIN, FERRITIN in the last 72 hours. Studies/Results: No results found. . sodium chloride   Intravenous Once  . sodium chloride   Intravenous Once  . Chlorhexidine Gluconate Cloth  6 each Topical Q0600  . cinacalcet  30 mg Oral Q breakfast  . darbepoetin (ARANESP) injection - DIALYSIS  200 mcg Intravenous Q Wed-HD  . ferric citrate  420 mg Oral BID WC  . furosemide  40-80 mg Oral BID  . heparin  1,000 Units Dialysis Once in dialysis  . insulin aspart  0-9 Units Subcutaneous TID  WC  . insulin aspart  4 Units Subcutaneous TID WC  . NIFEdipine  60 mg Oral QPM  . pantoprazole  40 mg Oral Daily  . Plecanatide  3 mg Oral Daily  . rOPINIRole  2 mg Oral QHS  . sevelamer carbonate  1,600 mg Oral BID WC  . sevelamer carbonate  2,400 mg Oral Q lunch  . sorbitol, milk of mag, mineral oil, glycerin (SMOG) enema  960 mL Rectal Once  . zinc sulfate  220 mg Oral Daily    BMET    Component Value Date/Time   NA 135 01/13/2020 0843   K 3.5 01/13/2020 0843   CL 93 (L) 01/13/2020 0843   CO2 24 01/13/2020 0843   GLUCOSE 193 (H) 01/13/2020 0843   BUN 62 (H) 01/13/2020 0843   CREATININE 10.83 (H) 01/13/2020 0843   CREATININE 5.93 (H) 06/29/2019 1210   CALCIUM 9.2 01/13/2020 0843   GFRNONAA 4 (L) 01/13/2020 0843   GFRNONAA 8 (L) 06/29/2019 1210   GFRAA 4 (L) 01/13/2020 0843   GFRAA 9 (L) 06/29/2019 1210   CBC    Component Value Date/Time   WBC 9.5 01/13/2020 0843   RBC 2.31 (L) 01/13/2020 0843   HGB 7.0 (L) 01/13/2020 0843   HCT 21.5 (L) 01/13/2020 0843   PLT 211 01/13/2020 0843   MCV 93.1 01/13/2020 0843   MCH 30.3 01/13/2020 0843   MCHC 32.6 01/13/2020 0843   RDW 15.4 01/13/2020 0843    LYMPHSABS 1.8 01/02/2020 0547   MONOABS 0.5 01/02/2020 0547   EOSABS 0.1 01/02/2020 0547   BASOSABS 0.0 01/02/2020 0547   Dialysis Orders:Outpt TK:[facility] MWF  4h 8min 400/600 110.5kg Hep 1000+ 1800/hr 2/2.5 bath L AVF  Assessment/Plan: 1. Acute on chronic hypoxic respiratory failure- with evidence of volume overload and anemia. Her edw was recently challenged and admits to poor appetite since her diagnosis of covid.  Improved with UF but still requiring 4 liters oxygen via [place].  She did cramp afterwards and was only 3 liters net negative.   1. Plan for HD again today and challenge edw as tolerated 2. ESRD- Cont with hd qMWF 3. Constipation - will try lactulose but may need KUB to evaluate if unable to have BM. 4. Hypertension/volume- UF as tolerated and cont to challenge edw 5. Anemia- s/p transfusion of 1 unit prbc with HD 01/11/20. Likely contributing to her symptoms.  ESA today with HD. 6. Metabolic bone disease- Cont with home meds 7. Nutrition- Renal diet, carb modified 8. covid-19- diagnosed 12/13/19 and s/p remdesivir/decadron. Not certain +PCR means anything as she is not febrile and SOB likely related to volume and not pneumonia.  No cough or loss of taste/smell.  Donetta Potts, MD [employer] [phone]

## 2020-01-13 NOTE — Progress Notes (Signed)
PROGRESS NOTE    Patricia [name]  GUR:[id number] DOB: [birth year] DOA: 01/10/2020 PCP: Jake Samples, PA-C   Brief Narrative:  49 y.o.femalewith medical history significant ofanemia, ankle fracture, osteoarthritis, history of blood transfusions, breast cancer, type 2 diabetes, ESRD on hemodialysis, GERD, hypertension, OSA on CPAP which she uses sometimes, history of pneumonia who is coming to the emergency department with progressively worse dyspnea, volume overload and hypertensive urgency.  1/27: Hemoglobin appears to remain low and patient still requiring 4 L nasal cannula oxygen.  Transfuse 1 unit PRBC with hemodialysis to be done today and ESA will also be given.  Lactulose and enema to be given for constipation.  Wean oxygen as tolerated.  Anticipate discharge in a.m. if hemoglobin stable and further improved.  Blood pressure still continue to remain somewhat elevated.  Assessment & Plan:   Principal Problem:   Acute pulmonary edema (HCC) Active Problems:   End stage renal disease on dialysis ([place])   Type 2 diabetes mellitus (HCC)   Essential hypertension   GERD (gastroesophageal reflux disease)   Volume overload   Lactic acidosis   Anemia   Elevated troponin   OSA (obstructive sleep apnea)   1. Acute on chronic hypoxemic respiratory failure secondary to pulmonary edema/volume overload-patient received emergency hemodialysis on 01/11/2020 and 3 L were removed.  She is starting to feel better.  She remains on oxygen at this time and is going to need additional HD treatment today and will be challenged to dry weight.    Appreciate nephrology evaluation 2. End-stage renal disease on hemodialysis established MWF -further HD planned for today 3. Recent COVID-19 infection-patient completed treatment for COVID-19, per Dr. Baxter Flattery does not require isolation.    Will plan to discontinue further isolation at this time. 4. Hypertensive urgency -still with some uncontrolled hypertension  related to volume overload.  Will challenge to dry weight today with dialysis. 5. Anemia of chronic kidney disease-patient had hemoglobin down to 6.6 upon admission and was transfused PRBCs during hemodialysis.  Hemoglobin is improved to 7.5, but has trended down to 7.0 today.  ESA today with HD along with 1 more unit PRBC transfusion. 6. OSA-BiPAP as needed. 7. Lactic acidosis-likely secondary to hypoxia which is now resolved. 8. Constipation.  Will give enema and lactulose ordered by nephrology.  DVT prophylaxis: SCDs Code Status: Full Family Communication: Patient determined to have full capacity and updated at bedside and verbalized understanding Disposition Plan: Continue inpatient management and require further HD treatments 01/13/2020 and oxygen support.  Wean oxygen as tolerated especially after hemodialysis.  1 more unit PRBC transfusion.  Recheck CBC in a.m. and consider potential discharge at that point.  Removed from isolation.  Enema and lactulose for bowel movement.   Consultants:  Nephrology   Procedures:   See Below  Antimicrobials:  Anti-infectives (From admission, onward)   None       Subjective: Patient seen and evaluated today with some ongoing nosebleeds occasionally and noted constipation with some lower abdominal pain.  Continues to have some elevated blood pressures.  Plans for hemodialysis today.  Objective: Vitals:   01/12/20 1330 01/12/20 1335 01/12/20 2137 01/13/20 0520  BP: (!) 187/88  (!) 175/70 (!) 185/86  Pulse: 97  94 98  Resp: 20  20 20   Temp: 98.1 F (36.7 C)  98.2 F (36.8 C) 98.2 F (36.8 C)  TempSrc:   Oral Oral  SpO2: 94% 96% 98% 95%  Weight:      Height:  Intake/Output Summary (Last 24 hours) at 01/13/2020 1252 Last data filed at 01/13/2020 1228 Gross per 24 hour  Intake 960 ml  Output -  Net 960 ml   Filed Weights   01/10/20 2352  Weight: 115.4 kg    Examination:  General exam: Appears calm and comfortable   Respiratory system: Clear to auscultation. Respiratory effort normal.  Currently on 4 L nasal cannula oxygen. Cardiovascular system: S1 & S2 heard, RRR. No JVD, murmurs, rubs, gallops or clicks. No pedal edema. Gastrointestinal system: Abdomen is nondistended, soft and nontender. No organomegaly or masses felt. Normal bowel sounds heard. Central nervous system: Alert and oriented. No focal neurological deficits. Extremities: Symmetric 5 x 5 power. Skin: No rashes, lesions or ulcers Psychiatry: Judgement and insight appear normal. Mood & affect appropriate.     Data Reviewed: I have personally reviewed following labs and imaging studies  CBC: Recent Labs  Lab 01/11/20 0010 01/12/20 0608 01/13/20 0601 01/13/20 0843  WBC 14.9* 9.6 10.0 9.5  HGB 6.9* 7.5* 7.3* 7.0*  HCT 22.6* 24.1* 23.3* 21.5*  MCV 97.8 96.0 93.6 93.1  PLT 249 171 214 160   Basic Metabolic Panel: Recent Labs  Lab 01/11/20 0010 01/12/20 0608 01/13/20 0601 01/13/20 0843  NA 138 137 136 135  K 4.2 3.8 3.6 3.5  CL 103 97* 94* 93*  CO2 20* 24 25 24   GLUCOSE 382* 217* 186* 193*  BUN 75* 48* 62* 62*  CREATININE 11.06* 7.80* 10.50* 10.83*  CALCIUM 9.3 9.1 9.4 9.2  MG  --   --  1.9  --   PHOS  --  4.6 3.7 3.9   GFR: Estimated Creatinine Clearance: 7.6 mL/min (A) (by C-G formula based on SCr of 10.83 mg/dL (H)). Liver Function Tests: Recent Labs  Lab 01/11/20 0010 01/12/20 0608 01/13/20 0601 01/13/20 0843  AST 29 19  --   --   ALT 59* 42  --   --   ALKPHOS 95 76  --   --   BILITOT 0.8 0.8  --   --   PROT 7.2 7.0  --   --   ALBUMIN 3.9 3.6 3.6 3.3*   No results for input(s): LIPASE, AMYLASE in the last 168 hours. No results for input(s): AMMONIA in the last 168 hours. Coagulation Profile: Recent Labs  Lab 01/12/20 0608  INR 1.1   Cardiac Enzymes: No results for input(s): CKTOTAL, CKMB, CKMBINDEX, TROPONINI in the last 168 hours. BNP (last 3 results) No results for input(s): PROBNP in the last  8760 hours. HbA1C: No results for input(s): HGBA1C in the last 72 hours. CBG: Recent Labs  Lab 01/12/20 1103 01/12/20 1622 01/12/20 2138 01/13/20 0742 01/13/20 1134  GLUCAP 227* 108* 114* 176* 143*   Lipid Profile: No results for input(s): CHOL, HDL, LDLCALC, TRIG, CHOLHDL, LDLDIRECT in the last 72 hours. Thyroid Function Tests: No results for input(s): TSH, T4TOTAL, FREET4, T3FREE, THYROIDAB in the last 72 hours. Anemia Panel: No results for input(s): VITAMINB12, FOLATE, FERRITIN, TIBC, IRON, RETICCTPCT in the last 72 hours. Sepsis Labs: Recent Labs  Lab 01/11/20 0011 01/11/20 0255  LATICACIDVEN 2.3* 1.3    Recent Results (from the past 240 hour(s))  Respiratory Panel by RT PCR (Flu A&B, Covid) - Nasopharyngeal Swab     Status: Abnormal   Collection Time: 01/11/20 12:11 AM   Specimen: Nasopharyngeal Swab  Result Value Ref Range Status   SARS Coronavirus 2 by RT PCR POSITIVE (A) NEGATIVE Final    Comment: RESULT CALLED  TO, READ BACK BY AND VERIFIED WITH: ANDREW,L @ 5102 ON 01/11/20 BY JUW    Influenza A by PCR NEGATIVE NEGATIVE Final   Influenza B by PCR NEGATIVE NEGATIVE Final    Comment: (NOTE) The Xpert Xpress SARS-CoV-2/FLU/RSV assay is intended as an aid in  the diagnosis of influenza from Nasopharyngeal swab specimens and  should not be used as a sole basis for treatment. Nasal washings and  aspirates are unacceptable for Xpert Xpress SARS-CoV-2/FLU/RSV  testing. Fact Sheet for Patients: PinkCheek.be Fact Sheet for Healthcare Providers: GravelBags.it This test is not yet approved or cleared by the [place] FDA and  has been authorized for detection and/or diagnosis of SARS-CoV-2 by  FDA under an Emergency Use Authorization (EUA). This EUA will remain  in effect (meaning this test can be used) for the duration of the  Covid-19 declaration under Section 564(b)(1) of the Act, 21  U.S.C. section  360bbb-3(b)(1), unless the authorization is  terminated or revoked. Performed at [facility], [address], [place], [place] [address]          Radiology Studies: No results found.      Scheduled Meds: . sodium chloride   Intravenous Once  . sodium chloride   Intravenous Once  . Chlorhexidine Gluconate Cloth  6 each Topical Q0600  . cinacalcet  30 mg Oral Q breakfast  . darbepoetin (ARANESP) injection - DIALYSIS  200 mcg Intravenous Q Wed-HD  . ferric citrate  420 mg Oral BID WC  . furosemide  40-80 mg Oral BID  . heparin  1,000 Units Dialysis Once in dialysis  . insulin aspart  0-9 Units Subcutaneous TID WC  . insulin aspart  4 Units Subcutaneous TID WC  . lactulose  20 g Oral BID  . NIFEdipine  60 mg Oral QPM  . pantoprazole  40 mg Oral Daily  . Plecanatide  3 mg Oral Daily  . rOPINIRole  1 mg Oral BID  . sevelamer carbonate  1,600 mg Oral BID WC  . sevelamer carbonate  2,400 mg Oral Q lunch  . sorbitol, milk of mag, mineral oil, glycerin (SMOG) enema  960 mL Rectal Once  . zinc sulfate  220 mg Oral Daily   Continuous Infusions: . sodium chloride    . sodium chloride Stopped (01/11/20 1600)     LOS: 2 days    Time spent: 30 minutes    Pratik Darleen Crocker, DO [facility] Pager [phone]  If 7PM-7AM, please contact night-coverage [email or web address] Password [facility] 01/13/2020, 12:52 PM

## 2020-01-13 NOTE — Procedures (Signed)
    HEMODIALYSIS TREATMENT NOTE:  4 hour heparin-free treatment completed via left upper arm AVF (15g ante/retrograde). Goal met: 3.1 liters removed.  UF was interrupted once for 15 minutes at pt's request for leg cramps.  One unit PRBCs was transfused with HD.  All blood was returned and hemostasis was achieved in 15 minutes.  Rockwell Alexandria, RN

## 2020-01-14 ENCOUNTER — Inpatient Hospital Stay (HOSPITAL_COMMUNITY): Payer: Medicare Other

## 2020-01-14 LAB — HEMOGLOBIN AND HEMATOCRIT, BLOOD
HCT: 29.7 % — ABNORMAL LOW (ref 36.0–46.0)
Hemoglobin: 9.5 g/dL — ABNORMAL LOW (ref 12.0–15.0)

## 2020-01-14 LAB — CBC
HCT: 29.5 % — ABNORMAL LOW (ref 36.0–46.0)
Hemoglobin: 9.5 g/dL — ABNORMAL LOW (ref 12.0–15.0)
MCH: 29.4 pg (ref 26.0–34.0)
MCHC: 32.2 g/dL (ref 30.0–36.0)
MCV: 91.3 fL (ref 80.0–100.0)
Platelets: 226 10*3/uL (ref 150–400)
RBC: 3.23 MIL/uL — ABNORMAL LOW (ref 3.87–5.11)
RDW: 14.4 % (ref 11.5–15.5)
WBC: 11 10*3/uL — ABNORMAL HIGH (ref 4.0–10.5)
nRBC: 0 % (ref 0.0–0.2)

## 2020-01-14 LAB — GLUCOSE, CAPILLARY
Glucose-Capillary: 174 mg/dL — ABNORMAL HIGH (ref 70–99)
Glucose-Capillary: 146 mg/dL — ABNORMAL HIGH (ref 70–99)
Glucose-Capillary: 132 mg/dL — ABNORMAL HIGH (ref 70–99)
Glucose-Capillary: 148 mg/dL — ABNORMAL HIGH (ref 70–99)

## 2020-01-14 LAB — RENAL FUNCTION PANEL
Albumin: 3.8 g/dL (ref 3.5–5.0)
Anion gap: 16 — ABNORMAL HIGH (ref 5–15)
BUN: 29 mg/dL — ABNORMAL HIGH (ref 6–20)
CO2: 27 mmol/L (ref 22–32)
Calcium: 9.4 mg/dL (ref 8.9–10.3)
Chloride: 92 mmol/L — ABNORMAL LOW (ref 98–111)
Creatinine, Ser: 6.69 mg/dL — ABNORMAL HIGH (ref 0.44–1.00)
GFR calc Af Amer: 8 mL/min — ABNORMAL LOW (ref >60)
GFR calc non Af Amer: 7 mL/min — ABNORMAL LOW (ref >60)
Glucose, Bld: 173 mg/dL — ABNORMAL HIGH (ref 70–99)
Phosphorus: 3.9 mg/dL (ref 2.5–4.6)
Potassium: 3.3 mmol/L — ABNORMAL LOW (ref 3.5–5.1)
Sodium: 135 mmol/L (ref 135–145)

## 2020-01-14 LAB — TYPE AND SCREEN
ABO/RH(D): O POS
Antibody Screen: NEGATIVE
Unit division: 0
Unit division: 0

## 2020-01-14 LAB — BPAM RBC
Blood Product Expiration Date: 202102132359
Blood Product Expiration Date: 202102132359
ISSUE DATE / TIME: 202101250942
ISSUE DATE / TIME: 202101271818
Unit Type and Rh: 5100
Unit Type and Rh: 5100

## 2020-01-14 NOTE — Progress Notes (Signed)
Patient report nausea this morning, no vomiting. PRN Zofran given, refused breakfast. Still reports nausea. ABD Xray to be obtained. Will continue to monitor.

## 2020-01-14 NOTE — Progress Notes (Signed)
Patient ID: Patricia [name], female   DOB: 1971-06-17, 49 y.o.   MRN: [id number] S: Complaining of N/V and unable to keep down lactulose  O:BP (!) 163/82   Pulse 95   Temp 99 F (37.2 C) (Oral)   Resp 20   Ht 5\' 3"  (1.6 m)   Wt 110.7 kg   SpO2 97%   BMI 43.23 kg/m   Intake/Output Summary (Last 24 hours) at 01/14/2020 1147 Last data filed at 01/13/2020 2045 Gross per 24 hour  Intake 795 ml  Output 3160 ml  Net -2365 ml   Intake/Output: I/O last 3 completed shifts: In: 7893 [P.O.:720; Blood:315] Out: 8101 [Other:3160]  Intake/Output this shift:  No intake/output data recorded. Weight change:  Physical exam: unable to complete due to COVID + status.  In order to preserve PPE equipment and to minimize exposure to providers.  Notes from other caregivers reviewed   Recent Labs  Lab 01/11/20 0010 01/12/20 0608 01/13/20 0601 01/13/20 0843 01/14/20 0651  NA 138 137 136 135 135  K 4.2 3.8 3.6 3.5 3.3*  CL 103 97* 94* 93* 92*  CO2 20* 24 25 24 27   GLUCOSE 382* 217* 186* 193* 173*  BUN 75* 48* 62* 62* 29*  CREATININE 11.06* 7.80* 10.50* 10.83* 6.69*  ALBUMIN 3.9 3.6 3.6 3.3* 3.8  CALCIUM 9.3 9.1 9.4 9.2 9.4  PHOS  --  4.6 3.7 3.9 3.9  AST 29 19  --   --   --   ALT 59* 42  --   --   --    Liver Function Tests: Recent Labs  Lab 01/11/20 0010 01/11/20 0010 01/12/20 0608 01/12/20 0608 01/13/20 0601 01/13/20 0843 01/14/20 0651  AST 29  --  19  --   --   --   --   ALT 59*  --  42  --   --   --   --   ALKPHOS 95  --  76  --   --   --   --   BILITOT 0.8  --  0.8  --   --   --   --   PROT 7.2  --  7.0  --   --   --   --   ALBUMIN 3.9   < > 3.6   < > 3.6 3.3* 3.8   < > = values in this interval not displayed.   No results for input(s): LIPASE, AMYLASE in the last 168 hours. No results for input(s): AMMONIA in the last 168 hours. CBC: Recent Labs  Lab 01/11/20 0010 01/11/20 0010 01/12/20 7510 01/12/20 2585 01/13/20 0601 01/13/20 0601 01/13/20 0843 01/14/20 0011  01/14/20 0651  WBC 14.9*   < > 9.6   < > 10.0  --  9.5  --  11.0*  HGB 6.9*   < > 7.5*   < > 7.3*   < > 7.0* 9.5* 9.5*  HCT 22.6*   < > 24.1*   < > 23.3*   < > 21.5* 29.7* 29.5*  MCV 97.8  --  96.0  --  93.6  --  93.1  --  91.3  PLT 249   < > 171   < > 214  --  211  --  226   < > = values in this interval not displayed.   Cardiac Enzymes: No results for input(s): CKTOTAL, CKMB, CKMBINDEX, TROPONINI in the last 168 hours. CBG: Recent Labs  Lab 01/13/20 1134 01/13/20 1616 01/13/20 2250 01/14/20  5615 01/14/20 1118  GLUCAP 143* 80 116* 174* 146*    Iron Studies: No results for input(s): IRON, TIBC, TRANSFERRIN, FERRITIN in the last 72 hours. Studies/Results: No results found. . sodium chloride   Intravenous Once  . sodium chloride   Intravenous Once  . Chlorhexidine Gluconate Cloth  6 each Topical Q0600  . cinacalcet  30 mg Oral Q breakfast  . darbepoetin (ARANESP) injection - DIALYSIS  200 mcg Intravenous Q Wed-HD  . ferric citrate  420 mg Oral BID WC  . furosemide  40-80 mg Oral BID  . heparin  1,000 Units Dialysis Once in dialysis  . insulin aspart  0-9 Units Subcutaneous TID WC  . insulin aspart  4 Units Subcutaneous TID WC  . lactulose  20 g Oral BID  . NIFEdipine  60 mg Oral QPM  . pantoprazole  40 mg Oral Daily  . Plecanatide  3 mg Oral Daily  . rOPINIRole  1 mg Oral BID  . sevelamer carbonate  1,600 mg Oral BID WC  . sevelamer carbonate  2,400 mg Oral Q lunch  . zinc sulfate  220 mg Oral Daily    BMET    Component Value Date/Time   NA 135 01/14/2020 0651   K 3.3 (L) 01/14/2020 0651   CL 92 (L) 01/14/2020 0651   CO2 27 01/14/2020 0651   GLUCOSE 173 (H) 01/14/2020 0651   BUN 29 (H) 01/14/2020 0651   CREATININE 6.69 (H) 01/14/2020 0651   CREATININE 5.93 (H) 06/29/2019 1210   CALCIUM 9.4 01/14/2020 0651   GFRNONAA 7 (L) 01/14/2020 0651   GFRNONAA 8 (L) 06/29/2019 1210   GFRAA 8 (L) 01/14/2020 0651   GFRAA 9 (L) 06/29/2019 1210   CBC    Component Value  Date/Time   WBC 11.0 (H) 01/14/2020 0651   RBC 3.23 (L) 01/14/2020 0651   HGB 9.5 (L) 01/14/2020 0651   HCT 29.5 (L) 01/14/2020 0651   PLT 226 01/14/2020 0651   MCV 91.3 01/14/2020 0651   MCH 29.4 01/14/2020 0651   MCHC 32.2 01/14/2020 0651   RDW 14.4 01/14/2020 0651   LYMPHSABS 1.8 01/02/2020 0547   MONOABS 0.5 01/02/2020 0547   EOSABS 0.1 01/02/2020 0547   BASOSABS 0.0 01/02/2020 0547    Dialysis Orders:Outpt PP:[facility] [place] MWF  4h 20min 400/600 110.5kg Hep 1000+ 1800/hr 2/2.5 bath L AVF  Assessment/Plan: 1. Acute on chronic hypoxic respiratory failure- with evidence of volume overload and anemia. Her edw was recently challenged and admits to poor appetite since her diagnosis of covid. Improved with UF but still requiring 4 liters oxygen via [place]. She did cramp afterwards and was only 3 liters net negative.  1. Oxygen requirements have decreased and now on 1-2 liters via [place] 2. ESRD- Cont with hd qMWF 3. Constipation - now with N/V concerning for obstruction.   1. Per Dr. Manuella Ghazi, for KUB today but may require CT scan 2. Given N/V/abd pain, discharge is on hold. 4. Hypertension/volume- UF as tolerated and cont to challenge edw 5. Anemia-s/ptransfusion of1 unit prbc with HD 01/11/20. Likelycontributing toher symptoms.  ESA today with HD. 6. Metabolic bone disease- Cont with home meds 7. Nutrition- Renal diet, carb modified 8. covid-19- diagnosed12/27/20and s/p remdesivir/decadron. Not certain +PCR means anything as she is not febrile and SOB likely related to volume and not pneumonia.  No cough or loss of taste/smell. 9. Disposition - on hold due to ongoing N/V/Abd pain and constipation  Donetta Potts, MD [place] (  [phone]  

## 2020-01-14 NOTE — Progress Notes (Signed)
PROGRESS NOTE    Patricia [name]  BLT:[id number] DOB: 1971-02-13 DOA: 01/10/2020 PCP: Jake Samples, PA-C   Brief Narrative:  49 y.o.femalewith medical history significant ofanemia, ankle fracture, osteoarthritis, history of blood transfusions, breast cancer, type 2 diabetes, ESRD on hemodialysis, GERD, hypertension, OSA on CPAP which she uses sometimes, history of pneumonia who is coming to the emergency department with progressively worse dyspnea, volume overload and hypertensive urgency.  1/27: Hemoglobin appears to remain low and patient still requiring 4 L nasal cannula oxygen.  Transfuse 1 unit PRBC with hemodialysis to be done today and ESA will also be given.  Lactulose and enema to be given for constipation.  Wean oxygen as tolerated.  Anticipate discharge in a.m. if hemoglobin stable and further improved.  Blood pressure still continue to remain somewhat elevated.  1/28: Hemoglobin has improved further to 9.5 after additional 1 unit transfusion.  She is complaining of a great deal of nausea today and is not able to tolerate much of her diet.  She has been weaned to 2 L nasal cannula oxygen.  KUB without any acute findings.  Nephrology plans for further dialysis tomorrow.  Blood pressure still somewhat elevated.  Assessment & Plan:   Principal Problem:   Acute pulmonary edema (HCC) Active Problems:   End stage renal disease on dialysis ([place])   Type 2 diabetes mellitus (HCC)   Essential hypertension   GERD (gastroesophageal reflux disease)   Volume overload   Lactic acidosis   Anemia   Elevated troponin   OSA (obstructive sleep apnea)   1. Acute on chronic hypoxemic respiratory failure secondary to pulmonary edema/volume overload-patient received emergency hemodialysis on 01/11/2020 and 3 L were removed. She is starting to feel better. She remains on oxygen at this time and is going to need additional HD treatment  tomorrow.   Appreciate nephrology  evaluation 2. End-stage renal disease on hemodialysis established MWF -further HD planned for tomorrow 3. Recent COVID-19 infection-patient completed treatment for COVID-19, per Dr. Baxter Flattery does not require isolation.   Will plan to discontinue further isolation at this time. 4. Hypertensive urgency -still with some uncontrolled hypertension.  Continue to monitor. 5. Anemia of chronic kidney disease-patient had hemoglobin down to 6.6 upon admission and was transfused PRBCs during hemodialysis. Hemoglobin improved to 9.5 after total 2 unit PRBC transfusion with last one on 1/27. 6. OSA-BiPAP as needed. 7. Lactic acidosis-likely secondary to hypoxia which is now resolved. 8. Constipation.    Continue to monitor as she feels as though she is ready to have a bowel movement.  Readminister enema as needed.  DVT prophylaxis:SCDs Code Status:Full Family Communication:Patient determined to have full capacity and updated at bedside and verbalized understanding Disposition Plan:Monitor for improvement in nausea and dietary tolerance.  Anticipate discharge tomorrow after further hemodialysis if she has improvement in her symptoms.   Consultants:  Nephrology  Procedures:   See Below  Antimicrobials:  Anti-infectives (From admission, onward)   None       Subjective: Patient seen and evaluated today with some ongoing nausea and has not been able to tolerate her breakfast or lunch.  No further bowel movements as of yet.  Objective: Vitals:   01/13/20 2045 01/13/20 2100 01/13/20 2249 01/14/20 0446  BP: (!) 170/95 (!) 172/91 (!) 148/83 (!) 163/82  Pulse: 92 90 87 95  Resp:  16 20 20   Temp:  98.3 F (36.8 C) 98.4 F (36.9 C) 99 F (37.2 C)  TempSrc:  Oral Oral Oral  SpO2:  94% 97%  Weight:      Height:        Intake/Output Summary (Last 24 hours) at 01/14/2020 1348 Last data filed at 01/13/2020 2045 Gross per 24 hour  Intake 555 ml  Output 3160 ml  Net -2605 ml   Filed  Weights   01/10/20 2352 01/13/20 1635  Weight: 115.4 kg 110.7 kg    Examination:  General exam: Appears calm and comfortable  Respiratory system: Clear to auscultation. Respiratory effort normal.  Currently on 2 L nasal cannula oxygen. Cardiovascular system: S1 & S2 heard, RRR. No JVD, murmurs, rubs, gallops or clicks. No pedal edema. Gastrointestinal system: Abdomen is nondistended, soft and nontender. No organomegaly or masses felt. Normal bowel sounds heard. Central nervous system: Alert and oriented. No focal neurological deficits. Extremities: Symmetric 5 x 5 power. Skin: No rashes, lesions or ulcers Psychiatry: Judgement and insight appear normal. Mood & affect appropriate.     Data Reviewed: I have personally reviewed following labs and imaging studies  CBC: Recent Labs  Lab 01/11/20 0010 01/11/20 0010 01/12/20 3154 01/13/20 0601 01/13/20 0843 01/14/20 0011 01/14/20 0651  WBC 14.9*  --  9.6 10.0 9.5  --  11.0*  HGB 6.9*   < > 7.5* 7.3* 7.0* 9.5* 9.5*  HCT 22.6*   < > 24.1* 23.3* 21.5* 29.7* 29.5*  MCV 97.8  --  96.0 93.6 93.1  --  91.3  PLT 249  --  171 214 211  --  226   < > = values in this interval not displayed.   Basic Metabolic Panel: Recent Labs  Lab 01/11/20 0010 01/12/20 0608 01/13/20 0601 01/13/20 0843 01/14/20 0651  NA 138 137 136 135 135  K 4.2 3.8 3.6 3.5 3.3*  CL 103 97* 94* 93* 92*  CO2 20* 24 25 24 27   GLUCOSE 382* 217* 186* 193* 173*  BUN 75* 48* 62* 62* 29*  CREATININE 11.06* 7.80* 10.50* 10.83* 6.69*  CALCIUM 9.3 9.1 9.4 9.2 9.4  MG  --   --  1.9  --   --   PHOS  --  4.6 3.7 3.9 3.9   GFR: Estimated Creatinine Clearance: 12 mL/min (A) (by C-G formula based on SCr of 6.69 mg/dL (H)). Liver Function Tests: Recent Labs  Lab 01/11/20 0010 01/12/20 0608 01/13/20 0601 01/13/20 0843 01/14/20 0651  AST 29 19  --   --   --   ALT 59* 42  --   --   --   ALKPHOS 95 76  --   --   --   BILITOT 0.8 0.8  --   --   --   PROT 7.2 7.0  --    --   --   ALBUMIN 3.9 3.6 3.6 3.3* 3.8   No results for input(s): LIPASE, AMYLASE in the last 168 hours. No results for input(s): AMMONIA in the last 168 hours. Coagulation Profile: Recent Labs  Lab 01/12/20 0608  INR 1.1   Cardiac Enzymes: No results for input(s): CKTOTAL, CKMB, CKMBINDEX, TROPONINI in the last 168 hours. BNP (last 3 results) No results for input(s): PROBNP in the last 8760 hours. HbA1C: No results for input(s): HGBA1C in the last 72 hours. CBG: Recent Labs  Lab 01/13/20 1134 01/13/20 1616 01/13/20 2250 01/14/20 0746 01/14/20 1118  GLUCAP 143* 80 116* 174* 146*   Lipid Profile: No results for input(s): CHOL, HDL, LDLCALC, TRIG, CHOLHDL, LDLDIRECT in the last 72 hours. Thyroid Function Tests: No results for input(s): TSH,  T4TOTAL, FREET4, T3FREE, THYROIDAB in the last 72 hours. Anemia Panel: No results for input(s): VITAMINB12, FOLATE, FERRITIN, TIBC, IRON, RETICCTPCT in the last 72 hours. Sepsis Labs: Recent Labs  Lab 01/11/20 0011 01/11/20 0255  LATICACIDVEN 2.3* 1.3    Recent Results (from the past 240 hour(s))  Respiratory Panel by RT PCR (Flu A&B, Covid) - Nasopharyngeal Swab     Status: Abnormal   Collection Time: 01/11/20 12:11 AM   Specimen: Nasopharyngeal Swab  Result Value Ref Range Status   SARS Coronavirus 2 by RT PCR POSITIVE (A) NEGATIVE Final    Comment: RESULT CALLED TO, READ BACK BY AND VERIFIED WITH: ANDREW,L @ 3557 ON 01/11/20 BY JUW    Influenza A by PCR NEGATIVE NEGATIVE Final   Influenza B by PCR NEGATIVE NEGATIVE Final    Comment: (NOTE) The Xpert Xpress SARS-CoV-2/FLU/RSV assay is intended as an aid in  the diagnosis of influenza from Nasopharyngeal swab specimens and  should not be used as a sole basis for treatment. Nasal washings and  aspirates are unacceptable for Xpert Xpress SARS-CoV-2/FLU/RSV  testing. Fact Sheet for Patients: PinkCheek.be Fact Sheet for Healthcare  Providers: GravelBags.it This test is not yet approved or cleared by the [place] FDA and  has been authorized for detection and/or diagnosis of SARS-CoV-2 by  FDA under an Emergency Use Authorization (EUA). This EUA will remain  in effect (meaning this test can be used) for the duration of the  Covid-19 declaration under Section 564(b)(1) of the Act, 21  U.S.C. section 360bbb-3(b)(1), unless the authorization is  terminated or revoked. Performed at [facility], [address], [place], [place] [address]          Radiology Studies: DG Abd 1 View  Result Date: 01/14/2020 CLINICAL DATA:  Progressive nausea. COVID-19. EXAM: ABDOMEN - 1 VIEW COMPARISON:  10/23/2018 FINDINGS: The bowel gas pattern is normal. No excessive stool in the colon. No fecal impaction. No visible free fluid or free air on the supine radiograph. Surgical clips in the right upper quadrant from previous cholecystectomy. Bones are normal. IMPRESSION: Benign-appearing abdomen and pelvis. Electronically Signed   By: Lorriane Shire M.D.   On: 01/14/2020 12:19        Scheduled Meds: . sodium chloride   Intravenous Once  . sodium chloride   Intravenous Once  . Chlorhexidine Gluconate Cloth  6 each Topical Q0600  . cinacalcet  30 mg Oral Q breakfast  . darbepoetin (ARANESP) injection - DIALYSIS  200 mcg Intravenous Q Wed-HD  . ferric citrate  420 mg Oral BID WC  . furosemide  40-80 mg Oral BID  . heparin  1,000 Units Dialysis Once in dialysis  . insulin aspart  0-9 Units Subcutaneous TID WC  . insulin aspart  4 Units Subcutaneous TID WC  . lactulose  20 g Oral BID  . NIFEdipine  60 mg Oral QPM  . pantoprazole  40 mg Oral Daily  . Plecanatide  3 mg Oral Daily  . rOPINIRole  1 mg Oral BID  . sevelamer carbonate  1,600 mg Oral BID WC  . sevelamer carbonate  2,400 mg Oral Q lunch   Continuous Infusions: . sodium chloride    . sodium chloride Stopped (01/11/20 1600)     LOS: 3  days    Time spent: 30 minutes    Roslind Michaux Darleen Crocker, DO [facility] Pager [phone]  If 7PM-7AM, please contact night-coverage [email or web address] Password [email or web address] 01/14/2020, 1:48 PM

## 2020-01-15 LAB — BASIC METABOLIC PANEL
Anion gap: 18 — ABNORMAL HIGH (ref 5–15)
BUN: 36 mg/dL — ABNORMAL HIGH (ref 6–20)
CO2: 26 mmol/L (ref 22–32)
Calcium: 9.5 mg/dL (ref 8.9–10.3)
Chloride: 91 mmol/L — ABNORMAL LOW (ref 98–111)
Creatinine, Ser: 9.5 mg/dL — ABNORMAL HIGH (ref 0.44–1.00)
GFR calc Af Amer: 5 mL/min — ABNORMAL LOW (ref >60)
GFR calc non Af Amer: 4 mL/min — ABNORMAL LOW (ref >60)
Glucose, Bld: 195 mg/dL — ABNORMAL HIGH (ref 70–99)
Potassium: 3.9 mmol/L (ref 3.5–5.1)
Sodium: 135 mmol/L (ref 135–145)

## 2020-01-15 LAB — GLUCOSE, CAPILLARY
Glucose-Capillary: 167 mg/dL — ABNORMAL HIGH (ref 70–99)
Glucose-Capillary: 197 mg/dL — ABNORMAL HIGH (ref 70–99)
Glucose-Capillary: 109 mg/dL — ABNORMAL HIGH (ref 70–99)

## 2020-01-15 LAB — CBC
HCT: 28.6 % — ABNORMAL LOW (ref 36.0–46.0)
Hemoglobin: 9.1 g/dL — ABNORMAL LOW (ref 12.0–15.0)
MCH: 29.4 pg (ref 26.0–34.0)
MCHC: 31.8 g/dL (ref 30.0–36.0)
MCV: 92.6 fL (ref 80.0–100.0)
Platelets: 224 10*3/uL (ref 150–400)
RBC: 3.09 MIL/uL — ABNORMAL LOW (ref 3.87–5.11)
RDW: 14.5 % (ref 11.5–15.5)
WBC: 9.4 10*3/uL (ref 4.0–10.5)
nRBC: 0 % (ref 0.0–0.2)

## 2020-01-15 MED ORDER — ONDANSETRON HCL 4 MG PO TABS: 4.0000 mg | ORAL_TABLET | Freq: Four times a day (QID) | ORAL | 0 refills | Status: DC | PRN

## 2020-01-15 NOTE — Progress Notes (Signed)
Patient ID: Patricia [name], female   DOB: March 19, 1971, 49 y.o.   MRN: [id number] S: Feeling better but still feels she has to have a bowel movement. O:BP 140/68 (BP Location: Right Arm)   Pulse 84   Temp 98 F (36.7 C) (Axillary)   Resp 16   Ht 5\' 3"  (1.6 m)   Wt 108.2 kg   SpO2 100%   BMI 42.26 kg/m   Intake/Output Summary (Last 24 hours) at 01/15/2020 1214 Last data filed at 01/14/2020 2100 Gross per 24 hour  Intake 480 ml  Output --  Net 480 ml   Intake/Output: I/O last 3 completed shifts: In: 960 [P.O.:960] Out: 3160 [Other:3160]  Intake/Output this shift:  No intake/output data recorded. Weight change: -2.5 kg Gen: NAD CVS: no rub Resp:CTA Abd: +BS, obese, mildly tender, no guarding or rebound Ext: LUE AVF no edema  Recent Labs  Lab 01/11/20 0010 01/12/20 0608 01/13/20 0601 01/13/20 0843 01/14/20 0651 01/15/20 0633  NA 138 137 136 135 135 135  K 4.2 3.8 3.6 3.5 3.3* 3.9  CL 103 97* 94* 93* 92* 91*  CO2 20* 24 25 24 27 26   GLUCOSE 382* 217* 186* 193* 173* 195*  BUN 75* 48* 62* 62* 29* 36*  CREATININE 11.06* 7.80* 10.50* 10.83* 6.69* 9.50*  ALBUMIN 3.9 3.6 3.6 3.3* 3.8  --   CALCIUM 9.3 9.1 9.4 9.2 9.4 9.5  PHOS  --  4.6 3.7 3.9 3.9  --   AST 29 19  --   --   --   --   ALT 59* 42  --   --   --   --    Liver Function Tests: Recent Labs  Lab 01/11/20 0010 01/11/20 0010 01/12/20 0608 01/12/20 0608 01/13/20 0601 01/13/20 0843 01/14/20 0651  AST 29  --  19  --   --   --   --   ALT 59*  --  42  --   --   --   --   ALKPHOS 95  --  76  --   --   --   --   BILITOT 0.8  --  0.8  --   --   --   --   PROT 7.2  --  7.0  --   --   --   --   ALBUMIN 3.9   < > 3.6   < > 3.6 3.3* 3.8   < > = values in this interval not displayed.   No results for input(s): LIPASE, AMYLASE in the last 168 hours. No results for input(s): AMMONIA in the last 168 hours. CBC: Recent Labs  Lab 01/12/20 0608 01/12/20 5397 01/13/20 0601 01/13/20 0601 01/13/20 0843 01/13/20 0843  01/14/20 0011 01/14/20 0651 01/15/20 0633  WBC 9.6   < > 10.0   < > 9.5  --   --  11.0* 9.4  HGB 7.5*   < > 7.3*   < > 7.0*   < > 9.5* 9.5* 9.1*  HCT 24.1*   < > 23.3*   < > 21.5*   < > 29.7* 29.5* 28.6*  MCV 96.0  --  93.6  --  93.1  --   --  91.3 92.6  PLT 171   < > 214   < > 211  --   --  226 224   < > = values in this interval not displayed.   Cardiac Enzymes: No results for input(s): CKTOTAL, CKMB, CKMBINDEX, TROPONINI in  the last 168 hours. CBG: Recent Labs  Lab 01/14/20 1118 01/14/20 1624 01/14/20 2010 01/15/20 0800 01/15/20 1119  GLUCAP 146* 132* 148* 167* 197*    Iron Studies: No results for input(s): IRON, TIBC, TRANSFERRIN, FERRITIN in the last 72 hours. Studies/Results: DG Abd 1 View  Result Date: 01/14/2020 CLINICAL DATA:  Progressive nausea. COVID-19. EXAM: ABDOMEN - 1 VIEW COMPARISON:  10/23/2018 FINDINGS: The bowel gas pattern is normal. No excessive stool in the colon. No fecal impaction. No visible free fluid or free air on the supine radiograph. Surgical clips in the right upper quadrant from previous cholecystectomy. Bones are normal. IMPRESSION: Benign-appearing abdomen and pelvis. Electronically Signed   By: Lorriane Shire M.D.   On: 01/14/2020 12:19   . sodium chloride   Intravenous Once  . sodium chloride   Intravenous Once  . Chlorhexidine Gluconate Cloth  6 each Topical Q0600  . cinacalcet  30 mg Oral Q breakfast  . darbepoetin (ARANESP) injection - DIALYSIS  200 mcg Intravenous Q Wed-HD  . ferric citrate  420 mg Oral BID WC  . furosemide  40-80 mg Oral BID  . heparin  1,000 Units Dialysis Once in dialysis  . insulin aspart  0-9 Units Subcutaneous TID WC  . insulin aspart  4 Units Subcutaneous TID WC  . lactulose  20 g Oral BID  . NIFEdipine  60 mg Oral QPM  . pantoprazole  40 mg Oral Daily  . Plecanatide  3 mg Oral Daily  . rOPINIRole  1 mg Oral BID  . sevelamer carbonate  1,600 mg Oral BID WC  . sevelamer carbonate  2,400 mg Oral Q lunch     BMET    Component Value Date/Time   NA 135 01/15/2020 0633   K 3.9 01/15/2020 0633   CL 91 (L) 01/15/2020 0633   CO2 26 01/15/2020 0633   GLUCOSE 195 (H) 01/15/2020 0633   BUN 36 (H) 01/15/2020 0633   CREATININE 9.50 (H) 01/15/2020 0633   CREATININE 5.93 (H) 06/29/2019 1210   CALCIUM 9.5 01/15/2020 0633   GFRNONAA 4 (L) 01/15/2020 0633   GFRNONAA 8 (L) 06/29/2019 1210   GFRAA 5 (L) 01/15/2020 0633   GFRAA 9 (L) 06/29/2019 1210   CBC    Component Value Date/Time   WBC 9.4 01/15/2020 0633   RBC 3.09 (L) 01/15/2020 0633   HGB 9.1 (L) 01/15/2020 0633   HCT 28.6 (L) 01/15/2020 0633   PLT 224 01/15/2020 0633   MCV 92.6 01/15/2020 0633   MCH 29.4 01/15/2020 0633   MCHC 31.8 01/15/2020 0633   RDW 14.5 01/15/2020 0633   LYMPHSABS 1.8 01/02/2020 0547   MONOABS 0.5 01/02/2020 0547   EOSABS 0.1 01/02/2020 0547   BASOSABS 0.0 01/02/2020 0547    Dialysis Orders:Outpt VE:[facility] MWF  4h 38min 400/600 110.5kg Hep 1000+ 1800/hr 2/2.5 bath L AVF  Assessment/Plan: 1. Acute on chronic hypoxic respiratory failure- with evidence of volume overload and anemia. Her edw was recently challenged and admits to poor appetite since her diagnosis of covid. Improved with UF but still requiring 4 liters oxygen via [place]. She did cramp afterwards and was only 3 liters net negative.  1. Oxygen requirements have decreased and now on 1-2 liters via [place] 2. ESRD- Cont with hd qMWF 3. Constipation - improved with enema and laxatives. 4. Hypertension/volume- UF as tolerated and cont to challenge edw 5. Anemia-s/ptransfusion of1 unit prbc with HD 01/11/20. Likelycontributing toher symptoms. ESA today with HD. 6. Metabolic bone disease-  Cont with home meds 7. Nutrition- Renal diet, carb modified 8. covid-19- diagnosed12/27/20and s/p remdesivir/decadron. Not certain +PCR means anything as she is not febrile and SOB likely related to volume and not pneumonia.No  cough or loss of taste/smell. 9. Disposition - for discharge to home today after dialysis.  Donetta Potts, MD [employer] [phone]

## 2020-01-15 NOTE — Progress Notes (Signed)
Patient requesting to do her CHG bath later today after she wakes up.  Moved administration time to 1000 per patient request.

## 2020-01-15 NOTE — Clinical Social Work Note (Signed)
Follow up appointment scheduled with Dr. Hilma Favors, 01/20/20 @ 1:30 added to AVS.    Hezikiah Retzloff, Clydene Pugh, LCSW

## 2020-01-15 NOTE — Discharge Summary (Signed)
Physician Discharge Summary  Patricia [name] XLK:[id number] DOB: 08-25-1971 DOA: 01/10/2020  PCP: Jake Samples, PA-C  Admit date: 01/10/2020  Discharge date: 01/15/2020  Admitted From:Home  Disposition:  Home  Recommendations for Outpatient Follow-up:  1. Follow up with PCP in 1-2 weeks 2. Continue on Zofran as needed for any nausea or vomiting and continue to use MiraLAX for bowel movements 3. Patient will have hemodialysis prior to discharge and is to continue her routine schedule after discharge  Home Health: None  Equipment/Devices: Has home 2 L nasal cannula oxygen  Discharge Condition: Stable  CODE STATUS: Full  Diet recommendation: Heart Healthy/carb modified  Brief/Interim Summary: 49 y.o.femalewith medical history significant ofanemia, ankle fracture, osteoarthritis, history of blood transfusions, breast cancer, type 2 diabetes, ESRD on hemodialysis, GERD, hypertension, OSA on CPAP which she uses sometimes, history of pneumonia who is coming to the emergency department with progressively worse dyspnea, volume overload and hypertensive urgency.  1/27:Hemoglobin appears to remain low and patient still requiring 4 L nasal cannula oxygen. Transfuse 1 unit PRBC with hemodialysis to be done today and ESA will also be given. Lactulose and enema to be given for constipation. Wean oxygen as tolerated. Anticipate discharge in a.m. if hemoglobin stable and further improved. Blood pressure still continue to remain somewhat elevated.  1/28: Hemoglobin has improved further to 9.5 after additional 1 unit transfusion.  She is complaining of a great deal of nausea today and is not able to tolerate much of her diet.  She has been weaned to 2 L nasal cannula oxygen.  KUB without any acute findings.  Nephrology plans for further dialysis tomorrow.  Blood pressure still somewhat elevated.  1/29: Patient remains on her baseline 2 L nasal cannula oxygen with no further significant  elevations in her blood pressure readings and stable hemoglobin levels.  She is due to have further hemodialysis today prior to discharge and will resume her usual schedule.  No other acute events during the course of this admission.  She has been noted to have ongoing elevated blood pressure readings which need to be followed up in the outpatient setting.  No significant elevations otherwise noted.  She was noted to have some nausea and vomiting which has now improved.  She has been given a prescription for Zofran as needed at home.  She has also been told to continue her MiraLAX on a daily basis for her constipation.  Discharge Diagnoses:  Principal Problem:   Acute pulmonary edema (HCC) Active Problems:   End stage renal disease on dialysis ([place])   Type 2 diabetes mellitus (HCC)   Essential hypertension   GERD (gastroesophageal reflux disease)   Volume overload   Lactic acidosis   Anemia   Elevated troponin   OSA (obstructive sleep apnea)  Principal discharge diagnosis: Acute on chronic hypoxemic respiratory failure secondary to pulmonary edema/volume overload with hypertensive urgency.  Discharge Instructions  Discharge Instructions    Diet - low sodium heart healthy   Complete by: As directed    Increase activity slowly   Complete by: As directed      Allergies as of 01/15/2020      Reactions   Amlodipine Besylate Rash, Other (See Comments)   dizziness   Reglan [metoclopramide] Other (See Comments)   hallucinations      Medication List    TAKE these medications   albuterol (2.5 MG/3ML) 0.083% nebulizer solution Commonly known as: PROVENTIL Take 3 mLs (2.5 mg total) by nebulization every 6 (six) hours as  needed for wheezing or shortness of breath.   albuterol 108 (90 Base) MCG/ACT inhaler Commonly known as: VENTOLIN HFA Inhale 2 puffs into the lungs every 6 (six) hours as needed for wheezing or shortness of breath.   Auryxia 1 GM 210 MG(Fe) tablet Generic drug:  ferric citrate Take 2 tablets by mouth 2 (two) times daily with a meal. With Breakfast & with supper   cinacalcet 30 MG tablet Commonly known as: SENSIPAR Take 30 mg by mouth every morning.   Flovent HFA 110 MCG/ACT inhaler Generic drug: fluticasone 1 puff 2 (two) times daily.   furosemide 40 MG tablet Commonly known as: LASIX Take 40-80 mg by mouth 2 (two) times daily. Take 2 tablets (80mg ) in the morning and take 1 tablet (40mg ) at bedtime   glipiZIDE 5 MG tablet Commonly known as: GLUCOTROL Take 5 mg by mouth 2 (two) times daily as needed. For blood sugar levels over 150   ipratropium 0.03 % nasal spray Commonly known as: ATROVENT Place 2 sprays into both nostrils 2 (two) times daily.   multivitamin Tabs tablet Take 1 tablet by mouth daily.   NIFEdipine 60 MG 24 hr tablet Commonly known as: ADALAT CC Take 1 tablet (60 mg total) by mouth every evening.   ondansetron 4 MG disintegrating tablet Commonly known as: ZOFRAN-ODT Take 4 mg by mouth every 8 (eight) hours as needed for nausea or vomiting.   ondansetron 4 MG tablet Commonly known as: ZOFRAN Take 1 tablet (4 mg total) by mouth every 6 (six) hours as needed for nausea.   OXYGEN Inhale 2-4 L into the lungs daily.   pantoprazole 40 MG tablet Commonly known as: PROTONIX Take 40 mg by mouth daily.   polyethylene glycol 17 g packet Commonly known as: MiraLax Take 17 g by mouth daily as needed for mild constipation.   Renvela 800 MG tablet Generic drug: sevelamer carbonate Take 800 mg by mouth See admin instructions. Take 2 tablets (1600 mg) in the morning, take 3 tablets (2400 mg) with lunch or snack,  and take 2 tablets (1600 mg) by mouth in the evening with dinner meal.   rOPINIRole 2 MG 24 hr tablet Commonly known as: REQUIP XL Take 2 mg by mouth at bedtime.   sodium chloride 0.65 % Soln nasal spray Commonly known as: OCEAN Place 1 spray into both nostrils as needed for congestion. What changed: when to  take this   Trulance 3 MG Tabs Generic drug: Plecanatide TAKE 1 TABLET BY MOUTH EVERY DAY What changed: how much to take      Follow-up Information    Jake Samples, PA-C Follow up in 1 week(s).   Specialty: Family Medicine Contact information: [place] [address] [phone]          Allergies  Allergen Reactions  . Amlodipine Besylate Rash and Other (See Comments)    dizziness  . Reglan [Metoclopramide] Other (See Comments)    hallucinations     Consultations:  Nephrology   Procedures/Studies: DG Abd 1 View  Result Date: 01/14/2020 CLINICAL DATA:  Progressive nausea. COVID-19. EXAM: ABDOMEN - 1 VIEW COMPARISON:  10/23/2018 FINDINGS: The bowel gas pattern is normal. No excessive stool in the colon. No fecal impaction. No visible free fluid or free air on the supine radiograph. Surgical clips in the right upper quadrant from previous cholecystectomy. Bones are normal. IMPRESSION: Benign-appearing abdomen and pelvis. Electronically Signed   By: Lorriane Shire M.D.   On: 01/14/2020 12:19  DG Chest Port 1 View  Result Date: 01/11/2020 CLINICAL DATA:  Shortness of breath, worsening tonight EXAM: PORTABLE CHEST 1 VIEW COMPARISON:  Radiograph 12/29/2019, 05/23/2019 FINDINGS: Bilateral basilar and perihilar predominant hazy airspace opacities with indistinctness and cephalization of the pulmonary vascularity. Cardiomegaly is increased from comparison portable radiograph. The hemidiaphragms are partially obscured which could reflect either consolidation at the lung bases or layering pleural fluid. No pneumothorax. Postsurgical changes in the left chest wall are similar to prior. No acute osseous or soft tissue abnormality. Telemetry leads overlie the chest. IMPRESSION: Findings most consistent with CHF with pulmonary edema and increasing cardiomegaly. Obscuration of the hemidiaphragms could reflect some airspace disease in the lung bases or layering  pleural fluid. Notably, infection could have a similar appearance in the appropriate clinical setting. Electronically Signed   By: Lovena Le M.D.   On: 01/11/2020 00:22   DG Chest Port 1 View  Result Date: 12/29/2019 CLINICAL DATA:  COVID-19 positive. Dialysis patient with short of breath EXAM: PORTABLE CHEST 1 VIEW COMPARISON:  05/14/2019 FINDINGS: Bibasilar airspace disease possible pneumonia given COVID status. Cardiac enlargement.  Negative for edema. IMPRESSION: Bibasilar airspace disease possible pneumonia. Electronically Signed   By: Franchot Gallo M.D.   On: 12/29/2019 13:04     Discharge Exam: Vitals:   01/14/20 2012 01/15/20 0500  BP: (!) 147/71 140/68  Pulse: 91 84  Resp: 17 16  Temp: 98.1 F (36.7 C) 98 F (36.7 C)  SpO2: 97% 100%   Vitals:   01/14/20 0446 01/14/20 1410 01/14/20 2012 01/15/20 0500  BP: (!) 163/82 (!) 149/92 (!) 147/71 140/68  Pulse: 95 86 91 84  Resp: 20 18 17 16   Temp: 99 F (37.2 C) 98.4 F (36.9 C) 98.1 F (36.7 C) 98 F (36.7 C)  TempSrc: Oral Oral  Axillary  SpO2: 97% 97% 97% 100%  Weight:    108.2 kg  Height:        General: Pt is alert, awake, not in acute distress, obese Cardiovascular: RRR, S1/S2 +, no rubs, no gallops Respiratory: CTA bilaterally, no wheezing, no rhonchi, on 2 L nasal cannula oxygen Abdominal: Soft, NT, ND, bowel sounds + Extremities: no edema, no cyanosis    The results of significant diagnostics from this hospitalization (including imaging, microbiology, ancillary and laboratory) are listed below for reference.     Microbiology: Recent Results (from the past 240 hour(s))  Respiratory Panel by RT PCR (Flu A&B, Covid) - Nasopharyngeal Swab     Status: Abnormal   Collection Time: 01/11/20 12:11 AM   Specimen: Nasopharyngeal Swab  Result Value Ref Range Status   SARS Coronavirus 2 by RT PCR POSITIVE (A) NEGATIVE Final    Comment: RESULT CALLED TO, READ BACK BY AND VERIFIED WITH: ANDREW,L @ 2426 ON 01/11/20  BY JUW    Influenza A by PCR NEGATIVE NEGATIVE Final   Influenza B by PCR NEGATIVE NEGATIVE Final    Comment: (NOTE) The Xpert Xpress SARS-CoV-2/FLU/RSV assay is intended as an aid in  the diagnosis of influenza from Nasopharyngeal swab specimens and  should not be used as a sole basis for treatment. Nasal washings and  aspirates are unacceptable for Xpert Xpress SARS-CoV-2/FLU/RSV  testing. Fact Sheet for Patients: PinkCheek.be Fact Sheet for Healthcare Providers: GravelBags.it This test is not yet approved or cleared by the [place] FDA and  has been authorized for detection and/or diagnosis of SARS-CoV-2 by  FDA under an Emergency Use Authorization (EUA). This EUA will remain  in  effect (meaning this test can be used) for the duration of the  Covid-19 declaration under Section 564(b)(1) of the Act, 21  U.S.C. section 360bbb-3(b)(1), unless the authorization is  terminated or revoked. Performed at [facility], [address] [place], [place] [address]      Labs: BNP (last 3 results) Recent Labs    01/11/20 0011  BNP 098.1*   Basic Metabolic Panel: Recent Labs  Lab 01/12/20 0608 01/13/20 0601 01/13/20 0843 01/14/20 0651 01/15/20 0633  NA 137 136 135 135 135  K 3.8 3.6 3.5 3.3* 3.9  CL 97* 94* 93* 92* 91*  CO2 24 25 24 27 26   GLUCOSE 217* 186* 193* 173* 195*  BUN 48* 62* 62* 29* 36*  CREATININE 7.80* 10.50* 10.83* 6.69* 9.50*  CALCIUM 9.1 9.4 9.2 9.4 9.5  MG  --  1.9  --   --   --   PHOS 4.6 3.7 3.9 3.9  --    Liver Function Tests: Recent Labs  Lab 01/11/20 0010 01/12/20 0608 01/13/20 0601 01/13/20 0843 01/14/20 0651  AST 29 19  --   --   --   ALT 59* 42  --   --   --   ALKPHOS 95 76  --   --   --   BILITOT 0.8 0.8  --   --   --   PROT 7.2 7.0  --   --   --   ALBUMIN 3.9 3.6 3.6 3.3* 3.8   No results for input(s): LIPASE, AMYLASE in the last 168 hours. No results for input(s): AMMONIA in  the last 168 hours. CBC: Recent Labs  Lab 01/12/20 0608 01/12/20 0608 01/13/20 0601 01/13/20 0843 01/14/20 0011 01/14/20 0651 01/15/20 0633  WBC 9.6  --  10.0 9.5  --  11.0* 9.4  HGB 7.5*   < > 7.3* 7.0* 9.5* 9.5* 9.1*  HCT 24.1*   < > 23.3* 21.5* 29.7* 29.5* 28.6*  MCV 96.0  --  93.6 93.1  --  91.3 92.6  PLT 171  --  214 211  --  226 224   < > = values in this interval not displayed.   Cardiac Enzymes: No results for input(s): CKTOTAL, CKMB, CKMBINDEX, TROPONINI in the last 168 hours. BNP: Invalid input(s): POCBNP CBG: Recent Labs  Lab 01/14/20 0746 01/14/20 1118 01/14/20 1624 01/14/20 2010 01/15/20 0800  GLUCAP 174* 146* 132* 148* 167*   D-Dimer No results for input(s): DDIMER in the last 72 hours. Hgb A1c No results for input(s): HGBA1C in the last 72 hours. Lipid Profile No results for input(s): CHOL, HDL, LDLCALC, TRIG, CHOLHDL, LDLDIRECT in the last 72 hours. Thyroid function studies No results for input(s): TSH, T4TOTAL, T3FREE, THYROIDAB in the last 72 hours.  Invalid input(s): FREET3 Anemia work up No results for input(s): VITAMINB12, FOLATE, FERRITIN, TIBC, IRON, RETICCTPCT in the last 72 hours. Urinalysis    Component Value Date/Time   COLORURINE STRAW (A) 12/22/2018 0847   APPEARANCEUR CLEAR 12/22/2018 0847   LABSPEC 1.007 12/22/2018 0847   PHURINE 9.0 (H) 12/22/2018 0847   GLUCOSEU >=500 (A) 12/22/2018 0847   HGBUR NEGATIVE 12/22/2018 0847   [place] 12/22/2018 0847   KETONESUR NEGATIVE 12/22/2018 0847   PROTEINUR 100 (A) 12/22/2018 0847   NITRITE NEGATIVE 12/22/2018 0847   LEUKOCYTESUR NEGATIVE 12/22/2018 0847   Sepsis Labs Invalid input(s): PROCALCITONIN,  WBC,  LACTICIDVEN Microbiology Recent Results (from the past 240 hour(s))  Respiratory Panel by RT PCR (Flu A&B, Covid) -  Nasopharyngeal Swab     Status: Abnormal   Collection Time: 01/11/20 12:11 AM   Specimen: Nasopharyngeal Swab  Result Value Ref Range Status   SARS  Coronavirus 2 by RT PCR POSITIVE (A) NEGATIVE Final    Comment: RESULT CALLED TO, READ BACK BY AND VERIFIED WITH: ANDREW,L @ 1751 ON 01/11/20 BY JUW    Influenza A by PCR NEGATIVE NEGATIVE Final   Influenza B by PCR NEGATIVE NEGATIVE Final    Comment: (NOTE) The Xpert Xpress SARS-CoV-2/FLU/RSV assay is intended as an aid in  the diagnosis of influenza from Nasopharyngeal swab specimens and  should not be used as a sole basis for treatment. Nasal washings and  aspirates are unacceptable for Xpert Xpress SARS-CoV-2/FLU/RSV  testing. Fact Sheet for Patients: PinkCheek.be Fact Sheet for Healthcare Providers: GravelBags.it This test is not yet approved or cleared by the [place] FDA and  has been authorized for detection and/or diagnosis of SARS-CoV-2 by  FDA under an Emergency Use Authorization (EUA). This EUA will remain  in effect (meaning this test can be used) for the duration of the  Covid-19 declaration under Section 564(b)(1) of the Act, 21  U.S.C. section 360bbb-3(b)(1), unless the authorization is  terminated or revoked. Performed at [facility], [address], [place], [place] [address]      Time coordinating discharge: 35 minutes  SIGNED:   Rodena Goldmann, DO [facility] 01/15/2020, 10:04 AM  If 7PM-7AM, please contact night-coverage [email or web address]

## 2020-01-17 DIAGNOSIS — N186 End stage renal disease: Secondary | ICD-10-CM | POA: Diagnosis not present

## 2020-01-17 DIAGNOSIS — Z992 Dependence on renal dialysis: Secondary | ICD-10-CM | POA: Diagnosis not present

## 2020-01-18 DIAGNOSIS — N186 End stage renal disease: Secondary | ICD-10-CM | POA: Diagnosis not present

## 2020-01-18 DIAGNOSIS — N2581 Secondary hyperparathyroidism of renal origin: Secondary | ICD-10-CM | POA: Diagnosis not present

## 2020-01-18 DIAGNOSIS — Z992 Dependence on renal dialysis: Secondary | ICD-10-CM | POA: Diagnosis not present

## 2020-01-18 DIAGNOSIS — D631 Anemia in chronic kidney disease: Secondary | ICD-10-CM | POA: Diagnosis not present

## 2020-01-18 DIAGNOSIS — D509 Iron deficiency anemia, unspecified: Secondary | ICD-10-CM | POA: Diagnosis not present

## 2020-01-20 DIAGNOSIS — Z4931 Encounter for adequacy testing for hemodialysis: Secondary | ICD-10-CM | POA: Diagnosis not present

## 2020-01-20 DIAGNOSIS — Z1389 Encounter for screening for other disorder: Secondary | ICD-10-CM | POA: Diagnosis not present

## 2020-01-20 DIAGNOSIS — N186 End stage renal disease: Secondary | ICD-10-CM | POA: Diagnosis not present

## 2020-01-20 DIAGNOSIS — N2581 Secondary hyperparathyroidism of renal origin: Secondary | ICD-10-CM | POA: Diagnosis not present

## 2020-01-20 DIAGNOSIS — Z6841 Body Mass Index (BMI) 40.0 and over, adult: Secondary | ICD-10-CM | POA: Diagnosis not present

## 2020-01-20 DIAGNOSIS — D509 Iron deficiency anemia, unspecified: Secondary | ICD-10-CM | POA: Diagnosis not present

## 2020-01-20 DIAGNOSIS — D500 Iron deficiency anemia secondary to blood loss (chronic): Secondary | ICD-10-CM | POA: Diagnosis not present

## 2020-01-20 DIAGNOSIS — D631 Anemia in chronic kidney disease: Secondary | ICD-10-CM | POA: Diagnosis not present

## 2020-01-20 DIAGNOSIS — Z992 Dependence on renal dialysis: Secondary | ICD-10-CM | POA: Diagnosis not present

## 2020-01-21 DIAGNOSIS — Z01419 Encounter for gynecological examination (general) (routine) without abnormal findings: Secondary | ICD-10-CM | POA: Diagnosis not present

## 2020-01-22 DIAGNOSIS — D631 Anemia in chronic kidney disease: Secondary | ICD-10-CM | POA: Diagnosis not present

## 2020-01-22 DIAGNOSIS — D509 Iron deficiency anemia, unspecified: Secondary | ICD-10-CM | POA: Diagnosis not present

## 2020-01-22 DIAGNOSIS — N2581 Secondary hyperparathyroidism of renal origin: Secondary | ICD-10-CM | POA: Diagnosis not present

## 2020-01-22 DIAGNOSIS — Z992 Dependence on renal dialysis: Secondary | ICD-10-CM | POA: Diagnosis not present

## 2020-01-22 DIAGNOSIS — N186 End stage renal disease: Secondary | ICD-10-CM | POA: Diagnosis not present

## 2020-01-25 DIAGNOSIS — N2581 Secondary hyperparathyroidism of renal origin: Secondary | ICD-10-CM | POA: Diagnosis not present

## 2020-01-25 DIAGNOSIS — D509 Iron deficiency anemia, unspecified: Secondary | ICD-10-CM | POA: Diagnosis not present

## 2020-01-25 DIAGNOSIS — D631 Anemia in chronic kidney disease: Secondary | ICD-10-CM | POA: Diagnosis not present

## 2020-01-25 DIAGNOSIS — N186 End stage renal disease: Secondary | ICD-10-CM | POA: Diagnosis not present

## 2020-01-25 DIAGNOSIS — Z992 Dependence on renal dialysis: Secondary | ICD-10-CM | POA: Diagnosis not present

## 2020-01-27 ENCOUNTER — Other Ambulatory Visit: Payer: Self-pay | Admitting: Gastroenterology

## 2020-01-27 DIAGNOSIS — D509 Iron deficiency anemia, unspecified: Secondary | ICD-10-CM | POA: Diagnosis not present

## 2020-01-27 DIAGNOSIS — D631 Anemia in chronic kidney disease: Secondary | ICD-10-CM | POA: Diagnosis not present

## 2020-01-27 DIAGNOSIS — Z992 Dependence on renal dialysis: Secondary | ICD-10-CM | POA: Diagnosis not present

## 2020-01-27 DIAGNOSIS — N2581 Secondary hyperparathyroidism of renal origin: Secondary | ICD-10-CM | POA: Diagnosis not present

## 2020-01-27 DIAGNOSIS — N186 End stage renal disease: Secondary | ICD-10-CM | POA: Diagnosis not present

## 2020-01-27 DIAGNOSIS — K5909 Other constipation: Secondary | ICD-10-CM

## 2020-01-29 DIAGNOSIS — N2581 Secondary hyperparathyroidism of renal origin: Secondary | ICD-10-CM | POA: Diagnosis not present

## 2020-01-29 DIAGNOSIS — Z992 Dependence on renal dialysis: Secondary | ICD-10-CM | POA: Diagnosis not present

## 2020-01-29 DIAGNOSIS — N186 End stage renal disease: Secondary | ICD-10-CM | POA: Diagnosis not present

## 2020-01-29 DIAGNOSIS — D631 Anemia in chronic kidney disease: Secondary | ICD-10-CM | POA: Diagnosis not present

## 2020-01-29 DIAGNOSIS — D509 Iron deficiency anemia, unspecified: Secondary | ICD-10-CM | POA: Diagnosis not present

## 2020-02-01 DIAGNOSIS — N2581 Secondary hyperparathyroidism of renal origin: Secondary | ICD-10-CM | POA: Diagnosis not present

## 2020-02-01 DIAGNOSIS — N186 End stage renal disease: Secondary | ICD-10-CM | POA: Diagnosis not present

## 2020-02-01 DIAGNOSIS — D509 Iron deficiency anemia, unspecified: Secondary | ICD-10-CM | POA: Diagnosis not present

## 2020-02-01 DIAGNOSIS — Z992 Dependence on renal dialysis: Secondary | ICD-10-CM | POA: Diagnosis not present

## 2020-02-01 DIAGNOSIS — D631 Anemia in chronic kidney disease: Secondary | ICD-10-CM | POA: Diagnosis not present

## 2020-02-03 DIAGNOSIS — Z992 Dependence on renal dialysis: Secondary | ICD-10-CM | POA: Diagnosis not present

## 2020-02-03 DIAGNOSIS — N2581 Secondary hyperparathyroidism of renal origin: Secondary | ICD-10-CM | POA: Diagnosis not present

## 2020-02-03 DIAGNOSIS — D631 Anemia in chronic kidney disease: Secondary | ICD-10-CM | POA: Diagnosis not present

## 2020-02-03 DIAGNOSIS — D509 Iron deficiency anemia, unspecified: Secondary | ICD-10-CM | POA: Diagnosis not present

## 2020-02-03 DIAGNOSIS — N186 End stage renal disease: Secondary | ICD-10-CM | POA: Diagnosis not present

## 2020-02-05 DIAGNOSIS — N186 End stage renal disease: Secondary | ICD-10-CM | POA: Diagnosis not present

## 2020-02-05 DIAGNOSIS — D509 Iron deficiency anemia, unspecified: Secondary | ICD-10-CM | POA: Diagnosis not present

## 2020-02-05 DIAGNOSIS — D631 Anemia in chronic kidney disease: Secondary | ICD-10-CM | POA: Diagnosis not present

## 2020-02-05 DIAGNOSIS — N2581 Secondary hyperparathyroidism of renal origin: Secondary | ICD-10-CM | POA: Diagnosis not present

## 2020-02-05 DIAGNOSIS — Z992 Dependence on renal dialysis: Secondary | ICD-10-CM | POA: Diagnosis not present

## 2020-02-08 DIAGNOSIS — Z992 Dependence on renal dialysis: Secondary | ICD-10-CM | POA: Diagnosis not present

## 2020-02-08 DIAGNOSIS — D631 Anemia in chronic kidney disease: Secondary | ICD-10-CM | POA: Diagnosis not present

## 2020-02-08 DIAGNOSIS — N2581 Secondary hyperparathyroidism of renal origin: Secondary | ICD-10-CM | POA: Diagnosis not present

## 2020-02-08 DIAGNOSIS — D509 Iron deficiency anemia, unspecified: Secondary | ICD-10-CM | POA: Diagnosis not present

## 2020-02-08 DIAGNOSIS — N186 End stage renal disease: Secondary | ICD-10-CM | POA: Diagnosis not present

## 2020-02-10 DIAGNOSIS — D509 Iron deficiency anemia, unspecified: Secondary | ICD-10-CM | POA: Diagnosis not present

## 2020-02-10 DIAGNOSIS — D631 Anemia in chronic kidney disease: Secondary | ICD-10-CM | POA: Diagnosis not present

## 2020-02-10 DIAGNOSIS — N186 End stage renal disease: Secondary | ICD-10-CM | POA: Diagnosis not present

## 2020-02-10 DIAGNOSIS — N2581 Secondary hyperparathyroidism of renal origin: Secondary | ICD-10-CM | POA: Diagnosis not present

## 2020-02-10 DIAGNOSIS — Z992 Dependence on renal dialysis: Secondary | ICD-10-CM | POA: Diagnosis not present

## 2020-02-12 DIAGNOSIS — D631 Anemia in chronic kidney disease: Secondary | ICD-10-CM | POA: Diagnosis not present

## 2020-02-12 DIAGNOSIS — N2581 Secondary hyperparathyroidism of renal origin: Secondary | ICD-10-CM | POA: Diagnosis not present

## 2020-02-12 DIAGNOSIS — D509 Iron deficiency anemia, unspecified: Secondary | ICD-10-CM | POA: Diagnosis not present

## 2020-02-12 DIAGNOSIS — N186 End stage renal disease: Secondary | ICD-10-CM | POA: Diagnosis not present

## 2020-02-12 DIAGNOSIS — Z992 Dependence on renal dialysis: Secondary | ICD-10-CM | POA: Diagnosis not present

## 2020-02-14 DIAGNOSIS — N186 End stage renal disease: Secondary | ICD-10-CM | POA: Diagnosis not present

## 2020-02-14 DIAGNOSIS — Z992 Dependence on renal dialysis: Secondary | ICD-10-CM | POA: Diagnosis not present

## 2020-02-15 DIAGNOSIS — N2581 Secondary hyperparathyroidism of renal origin: Secondary | ICD-10-CM | POA: Diagnosis not present

## 2020-02-15 DIAGNOSIS — Z992 Dependence on renal dialysis: Secondary | ICD-10-CM | POA: Diagnosis not present

## 2020-02-15 DIAGNOSIS — N186 End stage renal disease: Secondary | ICD-10-CM | POA: Diagnosis not present

## 2020-02-15 DIAGNOSIS — D631 Anemia in chronic kidney disease: Secondary | ICD-10-CM | POA: Diagnosis not present

## 2020-02-15 DIAGNOSIS — D509 Iron deficiency anemia, unspecified: Secondary | ICD-10-CM | POA: Diagnosis not present

## 2020-02-17 DIAGNOSIS — Z992 Dependence on renal dialysis: Secondary | ICD-10-CM | POA: Diagnosis not present

## 2020-02-17 DIAGNOSIS — D631 Anemia in chronic kidney disease: Secondary | ICD-10-CM | POA: Diagnosis not present

## 2020-02-17 DIAGNOSIS — E11311 Type 2 diabetes mellitus with unspecified diabetic retinopathy with macular edema: Secondary | ICD-10-CM | POA: Diagnosis not present

## 2020-02-17 DIAGNOSIS — D509 Iron deficiency anemia, unspecified: Secondary | ICD-10-CM | POA: Diagnosis not present

## 2020-02-17 DIAGNOSIS — N2581 Secondary hyperparathyroidism of renal origin: Secondary | ICD-10-CM | POA: Diagnosis not present

## 2020-02-17 DIAGNOSIS — H35033 Hypertensive retinopathy, bilateral: Secondary | ICD-10-CM | POA: Diagnosis not present

## 2020-02-17 DIAGNOSIS — E113513 Type 2 diabetes mellitus with proliferative diabetic retinopathy with macular edema, bilateral: Secondary | ICD-10-CM | POA: Diagnosis not present

## 2020-02-17 DIAGNOSIS — N186 End stage renal disease: Secondary | ICD-10-CM | POA: Diagnosis not present

## 2020-02-17 DIAGNOSIS — H25813 Combined forms of age-related cataract, bilateral: Secondary | ICD-10-CM | POA: Diagnosis not present

## 2020-02-18 ENCOUNTER — Encounter: Payer: Self-pay | Admitting: Nurse Practitioner

## 2020-02-18 ENCOUNTER — Other Ambulatory Visit: Payer: Self-pay

## 2020-02-18 ENCOUNTER — Ambulatory Visit (INDEPENDENT_AMBULATORY_CARE_PROVIDER_SITE_OTHER): Payer: Medicare Other | Admitting: Nurse Practitioner

## 2020-02-18 VITALS — BP 176/89 | HR 95 | Temp 97.0°F | Ht 63.0 in | Wt 238.0 lb

## 2020-02-18 DIAGNOSIS — K5909 Other constipation: Secondary | ICD-10-CM

## 2020-02-18 DIAGNOSIS — R1084 Generalized abdominal pain: Secondary | ICD-10-CM | POA: Diagnosis not present
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
## 2020-02-18 NOTE — Patient Instructions (Signed)
Your health issues we discussed today were:   Constipation with abdominal pain: 1. As we discussed, your abdominal pain is likely due to your constipation 2. When you get new insurance on April 1, try to get Trulance filled at the pharmacy.  If you need to, call our office and we can send a new prescription 3. In the meantime start taking Colace 100 mg (over-the-counter stool softener) once a day, every day 4. Take MiraLAX once a day, every day.  You can increase this to twice a day if needed 5. You can continue to take Dulcolax every other day as needed for bowel movements 6. Call us if you have any worsening or severe symptoms  Overall I recommend:  1. Continue your other current medications 2. Return for follow-up in 3 months 3. Call us if you have any questions or concerns   ---------------------------------------------------------------  COVID-19 Vaccine Information can be found at: [email or web address].co.uk For questions related to vaccine distribution or appointments, please email [email or web address] .[email or web address] or call [phone].   ---------------------------------------------------------------   At [facility] we value your feedback. You may receive a survey about your visit today. Please share your experience as we strive to create trusting relationships with our patients to provide genuine, compassionate, quality care.  We appreciate your understanding and patience as we review any laboratory studies, imaging, and other diagnostic tests that are ordered as we care for you. Our office policy is 5 business days for review of these results, and any emergent or urgent results are addressed in a timely manner for your best interest. If you do not hear from our office in 1 week, please contact us.   We also encourage the use of MyChart, which contains your medical information for your review as well. If you are not enrolled  in this feature, an access code is on this after visit summary for your convenience. Thank you for allowing [place] to be involved in your care.  It was great to see you today!  I hope you have a great day!!

## 2020-02-18 NOTE — Assessment & Plan Note (Signed)
Abdominal pain worsened/recurring and very likely associated with constipation and due to worsening constipation.  Her pain resolved when she does have a bowel movement.  Further management of constipation below given insurance difficulties getting medication coverage with her deductible.  Follow-up in 3 months and call for any worsening or severe symptoms before then.

## 2020-02-18 NOTE — Assessment & Plan Note (Signed)
Constipation somewhat worsened since she has run out of Trulance.  She tried to get a refill and it would cost anywhere from $200-$800 because of her deductible.  She indicates she is changing insurance around 1 April and her caseworker says that she will not have any issue getting medications filled at that point.  In the meantime I will start her on a regimen of Colace 100 mg daily, MiraLAX once a day to twice a day, Dulcolax every other day as needed based on hemodialysis schedule for good bowel movements.  Call for any worsening or severe symptoms.  Follow-up in 3 months.

## 2020-02-19 DIAGNOSIS — D509 Iron deficiency anemia, unspecified: Secondary | ICD-10-CM | POA: Diagnosis not present

## 2020-02-19 DIAGNOSIS — N2581 Secondary hyperparathyroidism of renal origin: Secondary | ICD-10-CM | POA: Diagnosis not present

## 2020-02-19 DIAGNOSIS — N186 End stage renal disease: Secondary | ICD-10-CM | POA: Diagnosis not present

## 2020-02-19 DIAGNOSIS — Z992 Dependence on renal dialysis: Secondary | ICD-10-CM | POA: Diagnosis not present

## 2020-02-19 DIAGNOSIS — D631 Anemia in chronic kidney disease: Secondary | ICD-10-CM | POA: Diagnosis not present

## 2020-02-22 DIAGNOSIS — N186 End stage renal disease: Secondary | ICD-10-CM | POA: Diagnosis not present

## 2020-02-22 DIAGNOSIS — D509 Iron deficiency anemia, unspecified: Secondary | ICD-10-CM | POA: Diagnosis not present

## 2020-02-22 DIAGNOSIS — D631 Anemia in chronic kidney disease: Secondary | ICD-10-CM | POA: Diagnosis not present

## 2020-02-22 DIAGNOSIS — N2581 Secondary hyperparathyroidism of renal origin: Secondary | ICD-10-CM | POA: Diagnosis not present

## 2020-02-22 DIAGNOSIS — Z992 Dependence on renal dialysis: Secondary | ICD-10-CM | POA: Diagnosis not present

## 2020-02-24 DIAGNOSIS — D631 Anemia in chronic kidney disease: Secondary | ICD-10-CM | POA: Diagnosis not present

## 2020-02-24 DIAGNOSIS — N186 End stage renal disease: Secondary | ICD-10-CM | POA: Diagnosis not present

## 2020-02-24 DIAGNOSIS — N2581 Secondary hyperparathyroidism of renal origin: Secondary | ICD-10-CM | POA: Diagnosis not present

## 2020-02-24 DIAGNOSIS — Z992 Dependence on renal dialysis: Secondary | ICD-10-CM | POA: Diagnosis not present

## 2020-02-24 DIAGNOSIS — D509 Iron deficiency anemia, unspecified: Secondary | ICD-10-CM | POA: Diagnosis not present

## 2020-02-25 DIAGNOSIS — R05 Cough: Secondary | ICD-10-CM | POA: Diagnosis not present

## 2020-02-25 DIAGNOSIS — G4733 Obstructive sleep apnea (adult) (pediatric): Secondary | ICD-10-CM | POA: Diagnosis not present

## 2020-02-25 DIAGNOSIS — R0902 Hypoxemia: Secondary | ICD-10-CM | POA: Diagnosis not present

## 2020-02-25 DIAGNOSIS — Z6841 Body Mass Index (BMI) 40.0 and over, adult: Secondary | ICD-10-CM | POA: Diagnosis not present

## 2020-02-25 DIAGNOSIS — I10 Essential (primary) hypertension: Secondary | ICD-10-CM | POA: Diagnosis not present

## 2020-02-26 DIAGNOSIS — Z992 Dependence on renal dialysis: Secondary | ICD-10-CM | POA: Diagnosis not present

## 2020-02-26 DIAGNOSIS — N186 End stage renal disease: Secondary | ICD-10-CM | POA: Diagnosis not present

## 2020-02-26 DIAGNOSIS — D509 Iron deficiency anemia, unspecified: Secondary | ICD-10-CM | POA: Diagnosis not present

## 2020-02-26 DIAGNOSIS — N2581 Secondary hyperparathyroidism of renal origin: Secondary | ICD-10-CM | POA: Diagnosis not present

## 2020-02-26 DIAGNOSIS — D631 Anemia in chronic kidney disease: Secondary | ICD-10-CM | POA: Diagnosis not present

## 2020-02-29 DIAGNOSIS — N2581 Secondary hyperparathyroidism of renal origin: Secondary | ICD-10-CM | POA: Diagnosis not present

## 2020-02-29 DIAGNOSIS — Z992 Dependence on renal dialysis: Secondary | ICD-10-CM | POA: Diagnosis not present

## 2020-02-29 DIAGNOSIS — D631 Anemia in chronic kidney disease: Secondary | ICD-10-CM | POA: Diagnosis not present

## 2020-02-29 DIAGNOSIS — N186 End stage renal disease: Secondary | ICD-10-CM | POA: Diagnosis not present

## 2020-02-29 DIAGNOSIS — Z23 Encounter for immunization: Secondary | ICD-10-CM | POA: Diagnosis not present

## 2020-02-29 DIAGNOSIS — D509 Iron deficiency anemia, unspecified: Secondary | ICD-10-CM | POA: Diagnosis not present

## 2020-03-02 DIAGNOSIS — N2581 Secondary hyperparathyroidism of renal origin: Secondary | ICD-10-CM | POA: Diagnosis not present

## 2020-03-02 DIAGNOSIS — Z992 Dependence on renal dialysis: Secondary | ICD-10-CM | POA: Diagnosis not present

## 2020-03-02 DIAGNOSIS — D631 Anemia in chronic kidney disease: Secondary | ICD-10-CM | POA: Diagnosis not present

## 2020-03-02 DIAGNOSIS — D509 Iron deficiency anemia, unspecified: Secondary | ICD-10-CM | POA: Diagnosis not present

## 2020-03-02 DIAGNOSIS — N186 End stage renal disease: Secondary | ICD-10-CM | POA: Diagnosis not present

## 2020-03-04 DIAGNOSIS — N2581 Secondary hyperparathyroidism of renal origin: Secondary | ICD-10-CM | POA: Diagnosis not present

## 2020-03-04 DIAGNOSIS — N186 End stage renal disease: Secondary | ICD-10-CM | POA: Diagnosis not present

## 2020-03-04 DIAGNOSIS — D509 Iron deficiency anemia, unspecified: Secondary | ICD-10-CM | POA: Diagnosis not present

## 2020-03-04 DIAGNOSIS — Z992 Dependence on renal dialysis: Secondary | ICD-10-CM | POA: Diagnosis not present

## 2020-03-04 DIAGNOSIS — D631 Anemia in chronic kidney disease: Secondary | ICD-10-CM | POA: Diagnosis not present

## 2020-03-07 DIAGNOSIS — Z992 Dependence on renal dialysis: Secondary | ICD-10-CM | POA: Diagnosis not present

## 2020-03-07 DIAGNOSIS — N186 End stage renal disease: Secondary | ICD-10-CM | POA: Diagnosis not present

## 2020-03-07 DIAGNOSIS — D509 Iron deficiency anemia, unspecified: Secondary | ICD-10-CM | POA: Diagnosis not present

## 2020-03-07 DIAGNOSIS — N2581 Secondary hyperparathyroidism of renal origin: Secondary | ICD-10-CM | POA: Diagnosis not present

## 2020-03-07 DIAGNOSIS — D631 Anemia in chronic kidney disease: Secondary | ICD-10-CM | POA: Diagnosis not present

## 2020-03-09 DIAGNOSIS — N186 End stage renal disease: Secondary | ICD-10-CM | POA: Diagnosis not present

## 2020-03-09 DIAGNOSIS — D631 Anemia in chronic kidney disease: Secondary | ICD-10-CM | POA: Diagnosis not present

## 2020-03-09 DIAGNOSIS — Z992 Dependence on renal dialysis: Secondary | ICD-10-CM | POA: Diagnosis not present

## 2020-03-09 DIAGNOSIS — N2581 Secondary hyperparathyroidism of renal origin: Secondary | ICD-10-CM | POA: Diagnosis not present

## 2020-03-09 DIAGNOSIS — D509 Iron deficiency anemia, unspecified: Secondary | ICD-10-CM | POA: Diagnosis not present

## 2020-03-11 DIAGNOSIS — N2581 Secondary hyperparathyroidism of renal origin: Secondary | ICD-10-CM | POA: Diagnosis not present

## 2020-03-11 DIAGNOSIS — Z992 Dependence on renal dialysis: Secondary | ICD-10-CM | POA: Diagnosis not present

## 2020-03-11 DIAGNOSIS — D509 Iron deficiency anemia, unspecified: Secondary | ICD-10-CM | POA: Diagnosis not present

## 2020-03-11 DIAGNOSIS — D631 Anemia in chronic kidney disease: Secondary | ICD-10-CM | POA: Diagnosis not present

## 2020-03-11 DIAGNOSIS — N186 End stage renal disease: Secondary | ICD-10-CM | POA: Diagnosis not present

## 2020-03-14 DIAGNOSIS — N186 End stage renal disease: Secondary | ICD-10-CM | POA: Diagnosis not present

## 2020-03-14 DIAGNOSIS — N2581 Secondary hyperparathyroidism of renal origin: Secondary | ICD-10-CM | POA: Diagnosis not present

## 2020-03-14 DIAGNOSIS — Z992 Dependence on renal dialysis: Secondary | ICD-10-CM | POA: Diagnosis not present

## 2020-03-14 DIAGNOSIS — D631 Anemia in chronic kidney disease: Secondary | ICD-10-CM | POA: Diagnosis not present

## 2020-03-14 DIAGNOSIS — D509 Iron deficiency anemia, unspecified: Secondary | ICD-10-CM | POA: Diagnosis not present

## 2020-03-16 DIAGNOSIS — N186 End stage renal disease: Secondary | ICD-10-CM | POA: Diagnosis not present

## 2020-03-16 DIAGNOSIS — D631 Anemia in chronic kidney disease: Secondary | ICD-10-CM | POA: Diagnosis not present

## 2020-03-16 DIAGNOSIS — N2581 Secondary hyperparathyroidism of renal origin: Secondary | ICD-10-CM | POA: Diagnosis not present

## 2020-03-16 DIAGNOSIS — Z992 Dependence on renal dialysis: Secondary | ICD-10-CM | POA: Diagnosis not present

## 2020-03-16 DIAGNOSIS — D509 Iron deficiency anemia, unspecified: Secondary | ICD-10-CM | POA: Diagnosis not present

## 2020-03-18 DIAGNOSIS — Z992 Dependence on renal dialysis: Secondary | ICD-10-CM | POA: Diagnosis not present

## 2020-03-18 DIAGNOSIS — N2581 Secondary hyperparathyroidism of renal origin: Secondary | ICD-10-CM | POA: Diagnosis not present

## 2020-03-18 DIAGNOSIS — N186 End stage renal disease: Secondary | ICD-10-CM | POA: Diagnosis not present

## 2020-03-18 DIAGNOSIS — D509 Iron deficiency anemia, unspecified: Secondary | ICD-10-CM | POA: Diagnosis not present

## 2020-03-18 DIAGNOSIS — D631 Anemia in chronic kidney disease: Secondary | ICD-10-CM | POA: Diagnosis not present

## 2020-03-21 DIAGNOSIS — D631 Anemia in chronic kidney disease: Secondary | ICD-10-CM | POA: Diagnosis not present

## 2020-03-21 DIAGNOSIS — N186 End stage renal disease: Secondary | ICD-10-CM | POA: Diagnosis not present

## 2020-03-21 DIAGNOSIS — D509 Iron deficiency anemia, unspecified: Secondary | ICD-10-CM | POA: Diagnosis not present

## 2020-03-21 DIAGNOSIS — Z992 Dependence on renal dialysis: Secondary | ICD-10-CM | POA: Diagnosis not present

## 2020-03-21 DIAGNOSIS — N2581 Secondary hyperparathyroidism of renal origin: Secondary | ICD-10-CM | POA: Diagnosis not present

## 2020-03-23 DIAGNOSIS — N186 End stage renal disease: Secondary | ICD-10-CM | POA: Diagnosis not present

## 2020-03-23 DIAGNOSIS — D509 Iron deficiency anemia, unspecified: Secondary | ICD-10-CM | POA: Diagnosis not present

## 2020-03-23 DIAGNOSIS — N2581 Secondary hyperparathyroidism of renal origin: Secondary | ICD-10-CM | POA: Diagnosis not present

## 2020-03-23 DIAGNOSIS — D631 Anemia in chronic kidney disease: Secondary | ICD-10-CM | POA: Diagnosis not present

## 2020-03-23 DIAGNOSIS — Z992 Dependence on renal dialysis: Secondary | ICD-10-CM | POA: Diagnosis not present

## 2020-03-25 DIAGNOSIS — D631 Anemia in chronic kidney disease: Secondary | ICD-10-CM | POA: Diagnosis not present

## 2020-03-25 DIAGNOSIS — N2581 Secondary hyperparathyroidism of renal origin: Secondary | ICD-10-CM | POA: Diagnosis not present

## 2020-03-25 DIAGNOSIS — Z992 Dependence on renal dialysis: Secondary | ICD-10-CM | POA: Diagnosis not present

## 2020-03-25 DIAGNOSIS — N186 End stage renal disease: Secondary | ICD-10-CM | POA: Diagnosis not present

## 2020-03-25 DIAGNOSIS — D509 Iron deficiency anemia, unspecified: Secondary | ICD-10-CM | POA: Diagnosis not present

## 2020-03-28 DIAGNOSIS — D509 Iron deficiency anemia, unspecified: Secondary | ICD-10-CM | POA: Diagnosis not present

## 2020-03-28 DIAGNOSIS — N186 End stage renal disease: Secondary | ICD-10-CM | POA: Diagnosis not present

## 2020-03-28 DIAGNOSIS — N2581 Secondary hyperparathyroidism of renal origin: Secondary | ICD-10-CM | POA: Diagnosis not present

## 2020-03-28 DIAGNOSIS — Z992 Dependence on renal dialysis: Secondary | ICD-10-CM | POA: Diagnosis not present

## 2020-03-28 DIAGNOSIS — D631 Anemia in chronic kidney disease: Secondary | ICD-10-CM | POA: Diagnosis not present

## 2020-03-29 DIAGNOSIS — Z23 Encounter for immunization: Secondary | ICD-10-CM | POA: Diagnosis not present

## 2020-03-30 DIAGNOSIS — D509 Iron deficiency anemia, unspecified: Secondary | ICD-10-CM | POA: Diagnosis not present

## 2020-03-30 DIAGNOSIS — N2581 Secondary hyperparathyroidism of renal origin: Secondary | ICD-10-CM | POA: Diagnosis not present

## 2020-03-30 DIAGNOSIS — D631 Anemia in chronic kidney disease: Secondary | ICD-10-CM | POA: Diagnosis not present

## 2020-03-30 DIAGNOSIS — N186 End stage renal disease: Secondary | ICD-10-CM | POA: Diagnosis not present

## 2020-03-30 DIAGNOSIS — Z992 Dependence on renal dialysis: Secondary | ICD-10-CM | POA: Diagnosis not present

## 2020-04-01 DIAGNOSIS — N186 End stage renal disease: Secondary | ICD-10-CM | POA: Diagnosis not present

## 2020-04-01 DIAGNOSIS — D631 Anemia in chronic kidney disease: Secondary | ICD-10-CM | POA: Diagnosis not present

## 2020-04-01 DIAGNOSIS — N2581 Secondary hyperparathyroidism of renal origin: Secondary | ICD-10-CM | POA: Diagnosis not present

## 2020-04-01 DIAGNOSIS — Z992 Dependence on renal dialysis: Secondary | ICD-10-CM | POA: Diagnosis not present

## 2020-04-01 DIAGNOSIS — D509 Iron deficiency anemia, unspecified: Secondary | ICD-10-CM | POA: Diagnosis not present

## 2020-04-04 DIAGNOSIS — N186 End stage renal disease: Secondary | ICD-10-CM | POA: Diagnosis not present

## 2020-04-04 DIAGNOSIS — D509 Iron deficiency anemia, unspecified: Secondary | ICD-10-CM | POA: Diagnosis not present

## 2020-04-04 DIAGNOSIS — Z20822 Contact with and (suspected) exposure to covid-19: Secondary | ICD-10-CM | POA: Diagnosis not present

## 2020-04-04 DIAGNOSIS — N2581 Secondary hyperparathyroidism of renal origin: Secondary | ICD-10-CM | POA: Diagnosis not present

## 2020-04-04 DIAGNOSIS — D631 Anemia in chronic kidney disease: Secondary | ICD-10-CM | POA: Diagnosis not present

## 2020-04-04 DIAGNOSIS — Z992 Dependence on renal dialysis: Secondary | ICD-10-CM | POA: Diagnosis not present

## 2020-04-06 DIAGNOSIS — D631 Anemia in chronic kidney disease: Secondary | ICD-10-CM | POA: Diagnosis not present

## 2020-04-06 DIAGNOSIS — D509 Iron deficiency anemia, unspecified: Secondary | ICD-10-CM | POA: Diagnosis not present

## 2020-04-06 DIAGNOSIS — N2581 Secondary hyperparathyroidism of renal origin: Secondary | ICD-10-CM | POA: Diagnosis not present

## 2020-04-06 DIAGNOSIS — Z992 Dependence on renal dialysis: Secondary | ICD-10-CM | POA: Diagnosis not present

## 2020-04-06 DIAGNOSIS — N186 End stage renal disease: Secondary | ICD-10-CM | POA: Diagnosis not present

## 2020-04-08 DIAGNOSIS — D509 Iron deficiency anemia, unspecified: Secondary | ICD-10-CM | POA: Diagnosis not present

## 2020-04-08 DIAGNOSIS — N2581 Secondary hyperparathyroidism of renal origin: Secondary | ICD-10-CM | POA: Diagnosis not present

## 2020-04-08 DIAGNOSIS — N186 End stage renal disease: Secondary | ICD-10-CM | POA: Diagnosis not present

## 2020-04-08 DIAGNOSIS — Z992 Dependence on renal dialysis: Secondary | ICD-10-CM | POA: Diagnosis not present

## 2020-04-08 DIAGNOSIS — D631 Anemia in chronic kidney disease: Secondary | ICD-10-CM | POA: Diagnosis not present

## 2020-04-11 DIAGNOSIS — Z992 Dependence on renal dialysis: Secondary | ICD-10-CM | POA: Diagnosis not present

## 2020-04-11 DIAGNOSIS — N2581 Secondary hyperparathyroidism of renal origin: Secondary | ICD-10-CM | POA: Diagnosis not present

## 2020-04-11 DIAGNOSIS — N186 End stage renal disease: Secondary | ICD-10-CM | POA: Diagnosis not present

## 2020-04-11 DIAGNOSIS — D631 Anemia in chronic kidney disease: Secondary | ICD-10-CM | POA: Diagnosis not present

## 2020-04-13 DIAGNOSIS — D631 Anemia in chronic kidney disease: Secondary | ICD-10-CM | POA: Diagnosis not present

## 2020-04-13 DIAGNOSIS — N186 End stage renal disease: Secondary | ICD-10-CM | POA: Diagnosis not present

## 2020-04-13 DIAGNOSIS — Z992 Dependence on renal dialysis: Secondary | ICD-10-CM | POA: Diagnosis not present

## 2020-04-13 DIAGNOSIS — N2581 Secondary hyperparathyroidism of renal origin: Secondary | ICD-10-CM | POA: Diagnosis not present

## 2020-04-15 DIAGNOSIS — D631 Anemia in chronic kidney disease: Secondary | ICD-10-CM | POA: Diagnosis not present

## 2020-04-15 DIAGNOSIS — Z992 Dependence on renal dialysis: Secondary | ICD-10-CM | POA: Diagnosis not present

## 2020-04-15 DIAGNOSIS — N186 End stage renal disease: Secondary | ICD-10-CM | POA: Diagnosis not present

## 2020-04-15 DIAGNOSIS — N2581 Secondary hyperparathyroidism of renal origin: Secondary | ICD-10-CM | POA: Diagnosis not present

## 2020-04-18 DIAGNOSIS — Z992 Dependence on renal dialysis: Secondary | ICD-10-CM | POA: Diagnosis not present

## 2020-04-18 DIAGNOSIS — N186 End stage renal disease: Secondary | ICD-10-CM | POA: Diagnosis not present

## 2020-04-18 DIAGNOSIS — D631 Anemia in chronic kidney disease: Secondary | ICD-10-CM | POA: Diagnosis not present

## 2020-04-18 DIAGNOSIS — N2581 Secondary hyperparathyroidism of renal origin: Secondary | ICD-10-CM | POA: Diagnosis not present

## 2020-04-20 DIAGNOSIS — N186 End stage renal disease: Secondary | ICD-10-CM | POA: Diagnosis not present

## 2020-04-20 DIAGNOSIS — Z992 Dependence on renal dialysis: Secondary | ICD-10-CM | POA: Diagnosis not present

## 2020-04-20 DIAGNOSIS — D631 Anemia in chronic kidney disease: Secondary | ICD-10-CM | POA: Diagnosis not present

## 2020-04-20 DIAGNOSIS — N2581 Secondary hyperparathyroidism of renal origin: Secondary | ICD-10-CM | POA: Diagnosis not present

## 2020-04-22 DIAGNOSIS — N186 End stage renal disease: Secondary | ICD-10-CM | POA: Diagnosis not present

## 2020-04-22 DIAGNOSIS — Z992 Dependence on renal dialysis: Secondary | ICD-10-CM | POA: Diagnosis not present

## 2020-04-22 DIAGNOSIS — D631 Anemia in chronic kidney disease: Secondary | ICD-10-CM | POA: Diagnosis not present

## 2020-04-22 DIAGNOSIS — N2581 Secondary hyperparathyroidism of renal origin: Secondary | ICD-10-CM | POA: Diagnosis not present

## 2020-04-25 DIAGNOSIS — N186 End stage renal disease: Secondary | ICD-10-CM | POA: Diagnosis not present

## 2020-04-25 DIAGNOSIS — Z992 Dependence on renal dialysis: Secondary | ICD-10-CM | POA: Diagnosis not present

## 2020-04-25 DIAGNOSIS — N2581 Secondary hyperparathyroidism of renal origin: Secondary | ICD-10-CM | POA: Diagnosis not present

## 2020-04-25 DIAGNOSIS — D631 Anemia in chronic kidney disease: Secondary | ICD-10-CM | POA: Diagnosis not present

## 2020-04-27 DIAGNOSIS — N186 End stage renal disease: Secondary | ICD-10-CM | POA: Diagnosis not present

## 2020-04-27 DIAGNOSIS — N2581 Secondary hyperparathyroidism of renal origin: Secondary | ICD-10-CM | POA: Diagnosis not present

## 2020-04-27 DIAGNOSIS — Z992 Dependence on renal dialysis: Secondary | ICD-10-CM | POA: Diagnosis not present

## 2020-04-27 DIAGNOSIS — D631 Anemia in chronic kidney disease: Secondary | ICD-10-CM | POA: Diagnosis not present

## 2020-04-29 DIAGNOSIS — N2581 Secondary hyperparathyroidism of renal origin: Secondary | ICD-10-CM | POA: Diagnosis not present

## 2020-04-29 DIAGNOSIS — D631 Anemia in chronic kidney disease: Secondary | ICD-10-CM | POA: Diagnosis not present

## 2020-04-29 DIAGNOSIS — Z992 Dependence on renal dialysis: Secondary | ICD-10-CM | POA: Diagnosis not present

## 2020-04-29 DIAGNOSIS — N186 End stage renal disease: Secondary | ICD-10-CM | POA: Diagnosis not present

## 2020-05-02 DIAGNOSIS — Z992 Dependence on renal dialysis: Secondary | ICD-10-CM | POA: Diagnosis not present

## 2020-05-02 DIAGNOSIS — N2581 Secondary hyperparathyroidism of renal origin: Secondary | ICD-10-CM | POA: Diagnosis not present

## 2020-05-02 DIAGNOSIS — D631 Anemia in chronic kidney disease: Secondary | ICD-10-CM | POA: Diagnosis not present

## 2020-05-02 DIAGNOSIS — N186 End stage renal disease: Secondary | ICD-10-CM | POA: Diagnosis not present

## 2020-05-04 DIAGNOSIS — D631 Anemia in chronic kidney disease: Secondary | ICD-10-CM | POA: Diagnosis not present

## 2020-05-04 DIAGNOSIS — Z992 Dependence on renal dialysis: Secondary | ICD-10-CM | POA: Diagnosis not present

## 2020-05-04 DIAGNOSIS — N2581 Secondary hyperparathyroidism of renal origin: Secondary | ICD-10-CM | POA: Diagnosis not present

## 2020-05-04 DIAGNOSIS — N186 End stage renal disease: Secondary | ICD-10-CM | POA: Diagnosis not present

## 2020-05-06 DIAGNOSIS — N2581 Secondary hyperparathyroidism of renal origin: Secondary | ICD-10-CM | POA: Diagnosis not present

## 2020-05-06 DIAGNOSIS — N186 End stage renal disease: Secondary | ICD-10-CM | POA: Diagnosis not present

## 2020-05-06 DIAGNOSIS — D631 Anemia in chronic kidney disease: Secondary | ICD-10-CM | POA: Diagnosis not present

## 2020-05-06 DIAGNOSIS — Z992 Dependence on renal dialysis: Secondary | ICD-10-CM | POA: Diagnosis not present

## 2020-05-09 DIAGNOSIS — N2581 Secondary hyperparathyroidism of renal origin: Secondary | ICD-10-CM | POA: Diagnosis not present

## 2020-05-09 DIAGNOSIS — N186 End stage renal disease: Secondary | ICD-10-CM | POA: Diagnosis not present

## 2020-05-09 DIAGNOSIS — Z992 Dependence on renal dialysis: Secondary | ICD-10-CM | POA: Diagnosis not present

## 2020-05-09 DIAGNOSIS — D631 Anemia in chronic kidney disease: Secondary | ICD-10-CM | POA: Diagnosis not present

## 2020-05-11 DIAGNOSIS — D631 Anemia in chronic kidney disease: Secondary | ICD-10-CM | POA: Diagnosis not present

## 2020-05-11 DIAGNOSIS — N2581 Secondary hyperparathyroidism of renal origin: Secondary | ICD-10-CM | POA: Diagnosis not present

## 2020-05-11 DIAGNOSIS — N186 End stage renal disease: Secondary | ICD-10-CM | POA: Diagnosis not present

## 2020-05-11 DIAGNOSIS — Z992 Dependence on renal dialysis: Secondary | ICD-10-CM | POA: Diagnosis not present

## 2020-05-13 DIAGNOSIS — Z992 Dependence on renal dialysis: Secondary | ICD-10-CM | POA: Diagnosis not present

## 2020-05-13 DIAGNOSIS — D631 Anemia in chronic kidney disease: Secondary | ICD-10-CM | POA: Diagnosis not present

## 2020-05-13 DIAGNOSIS — N2581 Secondary hyperparathyroidism of renal origin: Secondary | ICD-10-CM | POA: Diagnosis not present

## 2020-05-13 DIAGNOSIS — N186 End stage renal disease: Secondary | ICD-10-CM | POA: Diagnosis not present

## 2020-05-15 DIAGNOSIS — N2581 Secondary hyperparathyroidism of renal origin: Secondary | ICD-10-CM | POA: Diagnosis not present

## 2020-05-15 DIAGNOSIS — N186 End stage renal disease: Secondary | ICD-10-CM | POA: Diagnosis not present

## 2020-05-15 DIAGNOSIS — D631 Anemia in chronic kidney disease: Secondary | ICD-10-CM | POA: Diagnosis not present

## 2020-05-15 DIAGNOSIS — Z992 Dependence on renal dialysis: Secondary | ICD-10-CM | POA: Diagnosis not present

## 2020-05-16 DIAGNOSIS — N2581 Secondary hyperparathyroidism of renal origin: Secondary | ICD-10-CM | POA: Diagnosis not present

## 2020-05-16 DIAGNOSIS — D631 Anemia in chronic kidney disease: Secondary | ICD-10-CM | POA: Diagnosis not present

## 2020-05-16 DIAGNOSIS — N186 End stage renal disease: Secondary | ICD-10-CM | POA: Diagnosis not present

## 2020-05-16 DIAGNOSIS — Z992 Dependence on renal dialysis: Secondary | ICD-10-CM | POA: Diagnosis not present

## 2020-05-20 DIAGNOSIS — Z20822 Contact with and (suspected) exposure to covid-19: Secondary | ICD-10-CM | POA: Diagnosis not present

## 2020-05-24 ENCOUNTER — Ambulatory Visit: Payer: Medicare Other | Admitting: Nurse Practitioner

## 2020-05-24 DIAGNOSIS — N2581 Secondary hyperparathyroidism of renal origin: Secondary | ICD-10-CM | POA: Diagnosis not present

## 2020-05-24 DIAGNOSIS — N186 End stage renal disease: Secondary | ICD-10-CM | POA: Diagnosis not present

## 2020-05-24 DIAGNOSIS — Z992 Dependence on renal dialysis: Secondary | ICD-10-CM | POA: Diagnosis not present

## 2020-05-24 DIAGNOSIS — D631 Anemia in chronic kidney disease: Secondary | ICD-10-CM | POA: Diagnosis not present

## 2020-05-24 DIAGNOSIS — E1122 Type 2 diabetes mellitus with diabetic chronic kidney disease: Secondary | ICD-10-CM | POA: Diagnosis not present

## 2020-05-24 DIAGNOSIS — Z7984 Long term (current) use of oral hypoglycemic drugs: Secondary | ICD-10-CM | POA: Diagnosis not present

## 2020-05-25 DIAGNOSIS — Z992 Dependence on renal dialysis: Secondary | ICD-10-CM | POA: Diagnosis not present

## 2020-05-25 DIAGNOSIS — N186 End stage renal disease: Secondary | ICD-10-CM | POA: Diagnosis not present

## 2020-05-25 DIAGNOSIS — D631 Anemia in chronic kidney disease: Secondary | ICD-10-CM | POA: Diagnosis not present

## 2020-05-25 DIAGNOSIS — N2581 Secondary hyperparathyroidism of renal origin: Secondary | ICD-10-CM | POA: Diagnosis not present

## 2020-05-27 DIAGNOSIS — N2581 Secondary hyperparathyroidism of renal origin: Secondary | ICD-10-CM | POA: Diagnosis not present

## 2020-05-27 DIAGNOSIS — Z992 Dependence on renal dialysis: Secondary | ICD-10-CM | POA: Diagnosis not present

## 2020-05-27 DIAGNOSIS — N186 End stage renal disease: Secondary | ICD-10-CM | POA: Diagnosis not present

## 2020-05-27 DIAGNOSIS — D631 Anemia in chronic kidney disease: Secondary | ICD-10-CM | POA: Diagnosis not present

## 2020-05-30 DIAGNOSIS — Z992 Dependence on renal dialysis: Secondary | ICD-10-CM | POA: Diagnosis not present

## 2020-06-14 ENCOUNTER — Ambulatory Visit (INDEPENDENT_AMBULATORY_CARE_PROVIDER_SITE_OTHER): Payer: Medicare Other | Admitting: Gastroenterology

## 2020-06-14 ENCOUNTER — Other Ambulatory Visit: Payer: Self-pay

## 2020-06-14 ENCOUNTER — Encounter: Payer: Self-pay | Admitting: Gastroenterology

## 2020-06-14 VITALS — BP 160/93 | HR 99 | Temp 98.7°F | Ht 63.0 in | Wt 249.4 lb

## 2020-06-14 DIAGNOSIS — K5900 Constipation, unspecified: Secondary | ICD-10-CM

## 2020-06-14 NOTE — Patient Instructions (Signed)
We will see what we can do regarding patient assistance.  I have given you Linzess samples (we don't have Trulance here today) to at least get you going again and help bridge till we can figure out what is covered!  I do recommend trying Senokot, following directions on bottle and titrating as needed. Please let us know how this works!  We will see you in 2 months!  It was a pleasure to see you today. I want to create trusting relationships with patients to provide genuine, compassionate, and quality care. I value your feedback. If you receive a survey regarding your visit,  I greatly appreciate you taking time to fill this out.   Annitta Needs, PhD, ANP-BC [facility]

## 2020-06-14 NOTE — Progress Notes (Signed)
Referring Provider: Jake Samples, PA* Primary Care Physician:  Jake Samples, PA-C Primary GI: previously Dr. [place] Alar   Chief Complaint  Patient presents with  . Constipation    Trulance helped but insurance didn't cover it; OTC meds not helping  . Abdominal Pain    occ; hurts in lower back sometimes    HPI:   Patricia [name] is a 49 y.o. female presenting today with a history of constipation, abdominal pain, here for routine follow-up. Has tried Trulance, Amitiza, Linzess. Amitiza did well in past, but Trulance was the best.   Copays extremely high. Trulance Tier 4, Linzess Tier 4, Amitiza Tier 4. Too high. Movantik Tier 3 but not on opioids. Linzess didn't work.  Dulcolax and Colace doesn't work. Miralax not consistent.    ACV 2 tablespoons each morning, then colace in morning. At night will take dulcolax and another colace. If doesn't produce a BM, will take Miralax. Will have small BM but "nothing to talk about". On dialysis and can't drink much liquid. Has to go to ED to get an enema at times.  Tried multiple OTC agents.   Past Medical History:  Diagnosis Date  . Anemia   . Ankle fracture   . Arthritis   . Blood transfusion without reported diagnosis   . Breast cancer ([place])   . Cancer ([employer])   . Diabetes mellitus without complication ([place])   . Dialysis patient ([place])    mon, wed, friday,   . End stage renal disease on dialysis [facility])    M/W/F [facility] in [place]  . GERD (gastroesophageal reflux disease)   . Hypertension   . OSA (obstructive sleep apnea)    uses CPAP sometimes  . Pneumonia   . PONV (postoperative nausea and vomiting)   . Wears glasses     Past Surgical History:  Procedure Laterality Date  . ABDOMINAL HYSTERECTOMY    . AV FISTULA PLACEMENT  11/2014   at [place] N/A 07/10/2016   Procedure: BALLOON DILATION;  Surgeon: Danie Binder, MD;  [facility];  Service: Endoscopy;  Laterality: N/A;  Pyloric  dilation  . BREAST LUMPECTOMY    . CESAREAN SECTION    . CHOLECYSTECTOMY    . COLONOSCOPY WITH PROPOFOL N/A 09/27/2016   Dr. Gala Romney: Internal hemorrhoids repeat colonoscopy in 10 years  . DILATION AND CURETTAGE OF UTERUS    . ESOPHAGOGASTRODUODENOSCOPY N/A 07/10/2016   Dr.Fields- normal esophagus, gastric stenosis was found at the pylorus, gastritis on bx, normal examined duodenun  . EXTERNAL FIXATION REMOVAL Right 10/29/2018   Procedure: REMOVAL RIGHT ANKLE BIOMET ZIMMER EXTERNAL FIXATOR, SHORT LEG CAST APPLICATION;  Surgeon: Marybelle Killings, MD;  Location: [place];  Service: Orthopedics;  Laterality: Right;  . MASTECTOMY     left sided  . ORIF ANKLE FRACTURE Right 10/06/2018   Procedure: OPEN REDUCTION INTERNAL FIXATION (ORIF) RIGHT ANKLE TRIMALLEOLAR;  Surgeon: Marybelle Killings, MD;  Location: [place];  Service: Orthopedics;  Laterality: Right;    Current Outpatient Medications  Medication Sig Dispense Refill  . albuterol (PROVENTIL HFA;VENTOLIN HFA) 108 (90 Base) MCG/ACT inhaler Inhale 2 puffs into the lungs every 6 (six) hours as needed for wheezing or shortness of breath. 1 Inhaler 2  . albuterol (PROVENTIL) (2.5 MG/3ML) 0.083% nebulizer solution Take 3 mLs (2.5 mg total) by nebulization every 6 (six) hours as needed for wheezing or shortness of breath. 75 mL 12  . bisacodyl (DULCOLAX) 5 MG EC  tablet Take 5 mg by mouth daily as needed for moderate constipation.    . docusate sodium (COLACE) 100 MG capsule Take 100 mg by mouth daily as needed for mild constipation.    . Ferric Citrate (AURYXIA) 1 GM 210 MG(Fe) TABS Take 2 tablets by mouth 2 (two) times daily with a meal. With Breakfast & with supper    . FLOVENT HFA 110 MCG/ACT inhaler 1 puff 2 (two) times daily.    . furosemide (LASIX) 40 MG tablet Take 40-80 mg by mouth 2 (two) times daily. Take 2 tablets (80mg ) in the morning and take 1 tablet (40mg ) at bedtime    . glipiZIDE (GLUCOTROL) 5 MG tablet Take 5 mg by mouth 2 (two) times daily as  needed. For blood sugar levels over 150    . ipratropium (ATROVENT) 0.03 % nasal spray Place 2 sprays into both nostrils 2 (two) times daily.     . multivitamin (RENA-VIT) TABS tablet Take 1 tablet by mouth daily.    Marland Kitchen NIFEdipine (ADALAT CC) 60 MG 24 hr tablet Take 1 tablet (60 mg total) by mouth every evening.    . ondansetron (ZOFRAN-ODT) 4 MG disintegrating tablet Take 4 mg by mouth every 8 (eight) hours as needed for nausea or vomiting.    . pantoprazole (PROTONIX) 40 MG tablet Take 40 mg by mouth daily.    . polyethylene glycol (MIRALAX) 17 g packet Take 17 g by mouth daily as needed for mild constipation.    Marland Kitchen rOPINIRole (REQUIP XL) 2 MG 24 hr tablet Take 2 mg by mouth at bedtime.     . sevelamer carbonate (RENVELA) 800 MG tablet Take 800 mg by mouth See admin instructions. Take 2 tablets (1600 mg) in the morning, take 3 tablets (2400 mg) with lunch or snack,  and take 2 tablets (1600 mg) by mouth in the evening with dinner meal.    . sodium chloride (OCEAN) 0.65 % SOLN nasal spray Place 1 spray into both nostrils as needed for congestion. (Patient taking differently: Place 1 spray into both nostrils daily as needed for congestion. ) 60 mL 0  . OXYGEN Inhale 2-4 L into the lungs daily. (Patient not taking: Reported on 06/14/2020)    . TRULANCE 3 MG TABS TAKE 1 TABLET BY MOUTH EVERY DAY (Patient not taking: Reported on 02/18/2020) 90 tablet 3   No current facility-administered medications for this visit.    Allergies as of 06/14/2020 - Review Complete 06/14/2020  Allergen Reaction Noted  . Amlodipine besylate Rash and Other (See Comments) 12/04/2015  . Reglan [metoclopramide] Other (See Comments) 12/04/2015    Family History  Problem Relation Age of Onset  . Diabetes Mellitus II Mother   . Hypertension Mother   . Hypertension Sister   . Hypertension Sister   . Colon cancer Neg Hx     Social History   Socioeconomic History  . Marital status: Widowed    Spouse name: Not on file  .  Number of children: Not on file  . Years of education: Not on file  . Highest education level: Not on file  Occupational History  . Not on file  Tobacco Use  . Smoking status: Never Smoker  . Smokeless tobacco: Never Used  Vaping Use  . Vaping Use: Never used  Substance and Sexual Activity  . Alcohol use: No    Alcohol/week: 0.0 standard drinks  . Drug use: No  . Sexual activity: Not on file  Other Topics Concern  .  Not on file  Social History Narrative  . Not on file   Social Determinants of Health   Financial Resource Strain:   . Difficulty of Paying Living Expenses:   Food Insecurity:   . Worried About Charity fundraiser in the Last Year:   . Arboriculturist in the Last Year:   Transportation Needs:   . Film/video editor (Medical):   Marland Kitchen Lack of Transportation (Non-Medical):   Physical Activity:   . Days of Exercise per Week:   . Minutes of Exercise per Session:   Stress:   . Feeling of Stress :   Social Connections:   . Frequency of Communication with Friends and Family:   . Frequency of Social Gatherings with Friends and Family:   . Attends Religious Services:   . Active Member of Clubs or Organizations:   . Attends Archivist Meetings:   Marland Kitchen Marital Status:     Review of Systems: Gen: Denies fever, chills, anorexia. Denies fatigue, weakness, weight loss.  CV: Denies chest pain, palpitations, syncope, peripheral edema, and claudication. Resp: Denies dyspnea at rest, cough, wheezing, coughing up blood, and pleurisy. GI: see HPI Derm: Denies rash, itching, dry skin Psych: Denies depression, anxiety, memory loss, confusion. No homicidal or suicidal ideation.  Heme: Denies bruising, bleeding, and enlarged lymph nodes.  Physical Exam: BP (!) 160/93   Pulse 99   Temp 98.7 F (37.1 C) (Oral)   Ht 5\' 3"  (1.6 m)   Wt 249 lb 6.4 oz (113.1 kg)   BMI 44.18 kg/m  General:   Alert and oriented. No distress noted. Pleasant and cooperative.  Head:   Normocephalic and atraumatic. Eyes:  Conjuctiva clear without scleral icterus. Mouth: mask in place Abdomen:  +BS, soft, non-tender and non-distended. No rebound or guarding. No HSM or masses noted. Msk:  Symmetrical without gross deformities. Normal posture. Extremities:  Without edema. Neurologic:  Alert and  oriented x4 Psych:  Alert and cooperative. Normal mood and affect.  ASSESSMENT: Patricia [name] is a 49 y.o. female presenting today with history of chronic constipation, trying multiple OTC agents and unfortunately unable to afford Trulance due to tier status (Tier 4). Trulance has worked best historically.  We will try to see if patient assistance can be of help in this scenario. In interim, I have asked her to trial Senokot, as multiple other OTC agents have not been helpful. I have also given her Linzess to help "bridge" her until we can find out more regarding patient assistance.    PLAN:   Reaching out to patient assistance  Linzess samples in interim (We do not have trulance samples)  Trial of Senokot  Colonoscopy 2027  Return in 2 months   Annitta Needs, PhD, [facility] Gastroenterology

## 2020-06-16 DIAGNOSIS — R110 Nausea: Secondary | ICD-10-CM | POA: Diagnosis not present

## 2020-06-16 DIAGNOSIS — R1110 Vomiting, unspecified: Secondary | ICD-10-CM | POA: Diagnosis not present

## 2020-06-16 DIAGNOSIS — Z992 Dependence on renal dialysis: Secondary | ICD-10-CM | POA: Diagnosis not present

## 2020-06-16 DIAGNOSIS — N186 End stage renal disease: Secondary | ICD-10-CM | POA: Diagnosis not present

## 2020-06-16 DIAGNOSIS — D631 Anemia in chronic kidney disease: Secondary | ICD-10-CM | POA: Diagnosis not present

## 2020-06-16 DIAGNOSIS — D509 Iron deficiency anemia, unspecified: Secondary | ICD-10-CM | POA: Diagnosis not present

## 2020-06-16 DIAGNOSIS — N2581 Secondary hyperparathyroidism of renal origin: Secondary | ICD-10-CM | POA: Diagnosis not present

## 2020-06-17 DIAGNOSIS — N2581 Secondary hyperparathyroidism of renal origin: Secondary | ICD-10-CM | POA: Diagnosis not present

## 2020-06-17 DIAGNOSIS — D509 Iron deficiency anemia, unspecified: Secondary | ICD-10-CM | POA: Diagnosis not present

## 2020-06-17 DIAGNOSIS — Z992 Dependence on renal dialysis: Secondary | ICD-10-CM | POA: Diagnosis not present

## 2020-06-17 DIAGNOSIS — R1110 Vomiting, unspecified: Secondary | ICD-10-CM | POA: Diagnosis not present

## 2020-06-17 DIAGNOSIS — R110 Nausea: Secondary | ICD-10-CM | POA: Diagnosis not present

## 2020-06-17 DIAGNOSIS — N186 End stage renal disease: Secondary | ICD-10-CM | POA: Diagnosis not present

## 2020-06-20 ENCOUNTER — Telehealth: Payer: Self-pay | Admitting: Gastroenterology

## 2020-06-20 DIAGNOSIS — Z992 Dependence on renal dialysis: Secondary | ICD-10-CM | POA: Diagnosis not present

## 2020-06-20 DIAGNOSIS — N2581 Secondary hyperparathyroidism of renal origin: Secondary | ICD-10-CM | POA: Diagnosis not present

## 2020-06-20 DIAGNOSIS — N186 End stage renal disease: Secondary | ICD-10-CM | POA: Diagnosis not present

## 2020-06-20 DIAGNOSIS — R110 Nausea: Secondary | ICD-10-CM | POA: Diagnosis not present

## 2020-06-20 DIAGNOSIS — R1110 Vomiting, unspecified: Secondary | ICD-10-CM | POA: Diagnosis not present

## 2020-06-20 DIAGNOSIS — D509 Iron deficiency anemia, unspecified: Secondary | ICD-10-CM | POA: Diagnosis not present

## 2020-06-20 NOTE — Telephone Encounter (Signed)
Patricia [name] or [name]: do you know if patient assistance is possible for patient with Trulance or Amitiza? They are Tier 4 and too expensive.

## 2020-06-21 NOTE — Telephone Encounter (Signed)
FYI Spoke with pt. I'm mailing pt assistance forms for Amitiza. Pt was asked to return the forms to our office when pt has completed them. Pt was also asked to attach her W2 form as proof of income.

## 2020-06-22 DIAGNOSIS — N2581 Secondary hyperparathyroidism of renal origin: Secondary | ICD-10-CM | POA: Diagnosis not present

## 2020-06-22 DIAGNOSIS — Z992 Dependence on renal dialysis: Secondary | ICD-10-CM | POA: Diagnosis not present

## 2020-06-22 DIAGNOSIS — R110 Nausea: Secondary | ICD-10-CM | POA: Diagnosis not present

## 2020-06-22 DIAGNOSIS — N186 End stage renal disease: Secondary | ICD-10-CM | POA: Diagnosis not present

## 2020-06-22 DIAGNOSIS — D509 Iron deficiency anemia, unspecified: Secondary | ICD-10-CM | POA: Diagnosis not present

## 2020-06-22 DIAGNOSIS — R1110 Vomiting, unspecified: Secondary | ICD-10-CM | POA: Diagnosis not present

## 2020-06-24 DIAGNOSIS — Z992 Dependence on renal dialysis: Secondary | ICD-10-CM | POA: Diagnosis not present

## 2020-06-24 DIAGNOSIS — N2581 Secondary hyperparathyroidism of renal origin: Secondary | ICD-10-CM | POA: Diagnosis not present

## 2020-06-24 DIAGNOSIS — N186 End stage renal disease: Secondary | ICD-10-CM | POA: Diagnosis not present

## 2020-06-24 DIAGNOSIS — R1110 Vomiting, unspecified: Secondary | ICD-10-CM | POA: Diagnosis not present

## 2020-06-24 DIAGNOSIS — D509 Iron deficiency anemia, unspecified: Secondary | ICD-10-CM | POA: Diagnosis not present

## 2020-06-24 DIAGNOSIS — R110 Nausea: Secondary | ICD-10-CM | POA: Diagnosis not present

## 2020-06-28 ENCOUNTER — Ambulatory Visit: Payer: Medicare Other | Admitting: Orthopaedic Surgery

## 2020-06-28 DIAGNOSIS — Z992 Dependence on renal dialysis: Secondary | ICD-10-CM | POA: Diagnosis not present

## 2020-06-28 DIAGNOSIS — N186 End stage renal disease: Secondary | ICD-10-CM | POA: Diagnosis not present

## 2020-06-30 DIAGNOSIS — N186 End stage renal disease: Secondary | ICD-10-CM | POA: Diagnosis not present

## 2020-06-30 DIAGNOSIS — Z992 Dependence on renal dialysis: Secondary | ICD-10-CM | POA: Diagnosis not present

## 2020-07-02 DIAGNOSIS — Z992 Dependence on renal dialysis: Secondary | ICD-10-CM | POA: Diagnosis not present

## 2020-07-02 DIAGNOSIS — N186 End stage renal disease: Secondary | ICD-10-CM | POA: Diagnosis not present

## 2020-07-04 DIAGNOSIS — N186 End stage renal disease: Secondary | ICD-10-CM | POA: Diagnosis not present

## 2020-07-04 DIAGNOSIS — D509 Iron deficiency anemia, unspecified: Secondary | ICD-10-CM | POA: Diagnosis not present

## 2020-07-04 DIAGNOSIS — R1110 Vomiting, unspecified: Secondary | ICD-10-CM | POA: Diagnosis not present

## 2020-07-04 DIAGNOSIS — Z992 Dependence on renal dialysis: Secondary | ICD-10-CM | POA: Diagnosis not present

## 2020-07-04 DIAGNOSIS — N2581 Secondary hyperparathyroidism of renal origin: Secondary | ICD-10-CM | POA: Diagnosis not present

## 2020-07-04 DIAGNOSIS — R110 Nausea: Secondary | ICD-10-CM | POA: Diagnosis not present

## 2020-07-05 DIAGNOSIS — N186 End stage renal disease: Secondary | ICD-10-CM | POA: Diagnosis not present

## 2020-07-05 DIAGNOSIS — Z992 Dependence on renal dialysis: Secondary | ICD-10-CM | POA: Diagnosis not present

## 2020-07-06 DIAGNOSIS — N2581 Secondary hyperparathyroidism of renal origin: Secondary | ICD-10-CM | POA: Diagnosis not present

## 2020-07-06 DIAGNOSIS — D509 Iron deficiency anemia, unspecified: Secondary | ICD-10-CM | POA: Diagnosis not present

## 2020-07-06 DIAGNOSIS — R1110 Vomiting, unspecified: Secondary | ICD-10-CM | POA: Diagnosis not present

## 2020-07-06 DIAGNOSIS — N186 End stage renal disease: Secondary | ICD-10-CM | POA: Diagnosis not present

## 2020-07-06 DIAGNOSIS — R110 Nausea: Secondary | ICD-10-CM | POA: Diagnosis not present

## 2020-07-06 DIAGNOSIS — Z992 Dependence on renal dialysis: Secondary | ICD-10-CM | POA: Diagnosis not present

## 2020-07-08 DIAGNOSIS — D509 Iron deficiency anemia, unspecified: Secondary | ICD-10-CM | POA: Diagnosis not present

## 2020-07-08 DIAGNOSIS — R110 Nausea: Secondary | ICD-10-CM | POA: Diagnosis not present

## 2020-07-08 DIAGNOSIS — Z992 Dependence on renal dialysis: Secondary | ICD-10-CM | POA: Diagnosis not present

## 2020-07-08 DIAGNOSIS — R1110 Vomiting, unspecified: Secondary | ICD-10-CM | POA: Diagnosis not present

## 2020-07-08 DIAGNOSIS — N2581 Secondary hyperparathyroidism of renal origin: Secondary | ICD-10-CM | POA: Diagnosis not present

## 2020-07-08 DIAGNOSIS — N186 End stage renal disease: Secondary | ICD-10-CM | POA: Diagnosis not present

## 2020-07-11 DIAGNOSIS — N186 End stage renal disease: Secondary | ICD-10-CM | POA: Diagnosis not present

## 2020-07-11 DIAGNOSIS — R110 Nausea: Secondary | ICD-10-CM | POA: Diagnosis not present

## 2020-07-11 DIAGNOSIS — D509 Iron deficiency anemia, unspecified: Secondary | ICD-10-CM | POA: Diagnosis not present

## 2020-07-11 DIAGNOSIS — N2581 Secondary hyperparathyroidism of renal origin: Secondary | ICD-10-CM | POA: Diagnosis not present

## 2020-07-11 DIAGNOSIS — Z992 Dependence on renal dialysis: Secondary | ICD-10-CM | POA: Diagnosis not present

## 2020-07-11 DIAGNOSIS — R1110 Vomiting, unspecified: Secondary | ICD-10-CM | POA: Diagnosis not present

## 2020-07-12 ENCOUNTER — Ambulatory Visit (INDEPENDENT_AMBULATORY_CARE_PROVIDER_SITE_OTHER): Payer: Medicare Other | Admitting: Orthopaedic Surgery

## 2020-07-12 ENCOUNTER — Encounter: Payer: Self-pay | Admitting: Orthopaedic Surgery

## 2020-07-12 ENCOUNTER — Ambulatory Visit (INDEPENDENT_AMBULATORY_CARE_PROVIDER_SITE_OTHER): Payer: Medicare Other

## 2020-07-12 VITALS — BP 169/92 | HR 97 | Ht 63.0 in | Wt 245.0 lb

## 2020-07-12 DIAGNOSIS — M25571 Pain in right ankle and joints of right foot: Secondary | ICD-10-CM

## 2020-07-12 NOTE — Progress Notes (Signed)
Office Visit Note   Patient: Patricia [name]           Date of Birth: [birth year]           MRN: [id number] Visit Date: 07/12/2020              Requested by: Jake Samples, PA-C [address] [place],  [place] [address] PCP: Jake Samples, PA-C   Assessment & Plan: Visit Diagnoses:  1. Pain in right ankle and joints of right foot     Plan: Dialysis patient post 2 years right ankle trimalleolar ankle fracture fixation.  We will set her up for some physical  therapy to work on strengthening range of motion and help her with stairs climbing and descending stairs.  I can recheck her in 2 months.  Follow-Up Instructions: No follow-ups on file.   Orders:  Orders Placed This Encounter  Procedures  . XR Ankle Complete Right   No orders of the defined types were placed in this encounter.     Procedures: No procedures performed   Clinical Data: No additional findings.   Subjective: Chief Complaint  Patient presents with  . Right Ankle - Pain    10/06/2018 ORIF right trimalleolar fracture    HPI 49 year old female dialysis patient placed trimalleolar ankle fixation 10/06/2018.  Patient states she got Covid in December 2020.  She is on Eliquis for a period of time and then came off.  Her problem has been persistent problems with her right ankle using stairs going up and down stairs.  Sometimes she has pain in her ankle occasionally feels sharp or catching.  She had talked about having the plate removed but states she wants to wait till after Covid problems have resolved.  X-ray showed good healing of the fracture.  She has osteopenia related to her dialysis and had to have an anatomic plate with a total of 8 distal screws in the comminuted fibula.  Her x-rays appears to be healed.  She had posterior malleolar fixation as well as medial malleolar fixation.  At the end of the day she has swelling she is try to support stockings states that when her ankle swells he gets  tight.  She denies problems with the opposite ankle other than just slight swelling that occurs just where she gets dialyzed.  Review of Systems updated unchanged since surgery.  Of note is chronic diastolic heart failure chronic dialysis previous community-acquired pneumonia previous Covid end-stage renal failure on chronic dialysis.  Otherwise negative is obtains HPI.   Objective: Vital Signs: BP (!) 169/92   Pulse 97   Ht 5\' 3"  (1.6 m)   Wt (!) 245 lb (111.1 kg)   BMI 43.40 kg/m   Physical Exam Constitutional:      Appearance: She is well-developed.  HENT:     Head: Normocephalic.     Right Ear: External ear normal.     Left Ear: External ear normal.  Eyes:     Pupils: Pupils are equal, round, and reactive to light.  Neck:     Thyroid: No thyromegaly.     Trachea: No tracheal deviation.  Cardiovascular:     Rate and Rhythm: Normal rate.  Pulmonary:     Effort: Pulmonary effort is normal.  Abdominal:     Palpations: Abdomen is soft.  Skin:    General: Skin is warm and dry.  Neurological:     Mental Status: She is alert and oriented to person, place, and time.  Psychiatric:        Behavior: Behavior normal.     Ortho Exam shunt left upper arm for dialysis.  Well-healed medial lateral incisions as well as anterior incision.  She has some prominence where the fascia was split in the proximal fibular incision region and slightly anterior.  This is perineal muscles.  Slight pitting edema.  She is able to ambulate across the room without significant limping.  No crepitus with ankle flexion or extension.  Specialty Comments:  No specialty comments available.  Imaging: No results found.   PMFS History: Patient Active Problem List   Diagnosis Date Noted  . Acute pulmonary edema ([place]) 01/11/2020  . OSA (obstructive sleep apnea)   . Closed right ankle fracture 10/06/2018  . Closed displaced trimalleolar fracture of right ankle 10/06/2018  . Acute colitis 02/26/2018  .  Hypoxemia 02/24/2018  . Dyspnea 11/26/2017  . Anemia 11/26/2017  . Elevated troponin 11/26/2017  . Hyperkalemia 09/29/2017  . Acute respiratory failure ([place]) 09/28/2017  . CAP (community acquired pneumonia) 09/26/2017  . Volume overload 09/25/2017  . Acute on chronic diastolic CHF (congestive heart failure) ([place]) 09/25/2017  . Lactic acidosis 09/25/2017  . Hypokalemia 09/25/2017  . Breast cancer ([place]) 07/22/2017  . SOB (shortness of breath)   . Acute respiratory failure with hypoxia ([place]) 07/21/2017  . Flatulence 10/25/2016  . GERD (gastroesophageal reflux disease) 08/02/2016  . Pyloric stenosis, acquired   . Nausea with vomiting   . Generalized abdominal pain   . Chest pain, rule out acute myocardial infarction 07/04/2016  . Constipation 07/04/2016  . Nausea 07/04/2016  . Essential hypertension 07/04/2016  . HCAP (healthcare-associated pneumonia) 12/04/2015  . End stage renal disease on dialysis ([place]) 12/04/2015  . Type 2 diabetes mellitus ([place]) 12/04/2015   Past Medical History:  Diagnosis Date  . Anemia   . Ankle fracture   . Arthritis   . Blood transfusion without reported diagnosis   . Breast cancer ([place])   . Cancer ([place])   . Diabetes mellitus without complication ([place])   . Dialysis patient ([place])    mon, wed, friday,   . End stage renal disease on dialysis [facility])    M/W/F [facility] in [place]  . GERD (gastroesophageal reflux disease)   . Hypertension   . OSA (obstructive sleep apnea)    uses CPAP sometimes  . Pneumonia   . PONV (postoperative nausea and vomiting)   . Wears glasses     Family History  Problem Relation Age of Onset  . Diabetes Mellitus II Mother   . Hypertension Mother   . Hypertension Sister   . Hypertension Sister   . Colon cancer Neg Hx     Past Surgical History:  Procedure Laterality Date  . ABDOMINAL HYSTERECTOMY    . AV FISTULA PLACEMENT  11/2014   at [place] N/A 07/10/2016   Procedure: BALLOON DILATION;   Surgeon: Danie Binder, MD;  [facility];  Service: Endoscopy;  Laterality: N/A;  Pyloric dilation  . BREAST LUMPECTOMY    . CESAREAN SECTION    . CHOLECYSTECTOMY    . COLONOSCOPY WITH PROPOFOL N/A 09/27/2016   Dr. Gala Romney: Internal hemorrhoids repeat colonoscopy in 10 years  . DILATION AND CURETTAGE OF UTERUS    . ESOPHAGOGASTRODUODENOSCOPY N/A 07/10/2016   Dr.Fields- normal esophagus, gastric stenosis was found at the pylorus, gastritis on bx, normal examined duodenun  . EXTERNAL FIXATION REMOVAL Right 10/29/2018   Procedure: REMOVAL RIGHT ANKLE BIOMET  ZIMMER EXTERNAL FIXATOR, SHORT LEG CAST APPLICATION;  Surgeon: Marybelle Killings, MD;  Location: [place];  Service: Orthopedics;  Laterality: Right;  . MASTECTOMY     left sided  . ORIF ANKLE FRACTURE Right 10/06/2018   Procedure: OPEN REDUCTION INTERNAL FIXATION (ORIF) RIGHT ANKLE TRIMALLEOLAR;  Surgeon: Marybelle Killings, MD;  Location: [place];  Service: Orthopedics;  Laterality: Right;   Social History   Occupational History  . Not on file  Tobacco Use  . Smoking status: Never Smoker  . Smokeless tobacco: Never Used  Vaping Use  . Vaping Use: Never used  Substance and Sexual Activity  . Alcohol use: No    Alcohol/week: 0.0 standard drinks  . Drug use: No  . Sexual activity: Not on file

## 2020-07-13 DIAGNOSIS — R1110 Vomiting, unspecified: Secondary | ICD-10-CM | POA: Diagnosis not present

## 2020-07-13 DIAGNOSIS — N2581 Secondary hyperparathyroidism of renal origin: Secondary | ICD-10-CM | POA: Diagnosis not present

## 2020-07-13 DIAGNOSIS — Z992 Dependence on renal dialysis: Secondary | ICD-10-CM | POA: Diagnosis not present

## 2020-07-13 DIAGNOSIS — Z08 Encounter for follow-up examination after completed treatment for malignant neoplasm: Secondary | ICD-10-CM | POA: Diagnosis not present

## 2020-07-13 DIAGNOSIS — D509 Iron deficiency anemia, unspecified: Secondary | ICD-10-CM | POA: Diagnosis not present

## 2020-07-13 DIAGNOSIS — Z853 Personal history of malignant neoplasm of breast: Secondary | ICD-10-CM | POA: Diagnosis not present

## 2020-07-13 DIAGNOSIS — Z9012 Acquired absence of left breast and nipple: Secondary | ICD-10-CM | POA: Diagnosis not present

## 2020-07-13 DIAGNOSIS — N6310 Unspecified lump in the right breast, unspecified quadrant: Secondary | ICD-10-CM | POA: Diagnosis not present

## 2020-07-13 DIAGNOSIS — D0512 Intraductal carcinoma in situ of left breast: Secondary | ICD-10-CM | POA: Diagnosis not present

## 2020-07-13 DIAGNOSIS — N186 End stage renal disease: Secondary | ICD-10-CM | POA: Diagnosis not present

## 2020-07-13 DIAGNOSIS — R928 Other abnormal and inconclusive findings on diagnostic imaging of breast: Secondary | ICD-10-CM | POA: Diagnosis not present

## 2020-07-13 DIAGNOSIS — R110 Nausea: Secondary | ICD-10-CM | POA: Diagnosis not present

## 2020-07-14 NOTE — Telephone Encounter (Signed)
Spoke with pt. Pt is going to stop by the office. Pt forgot to sign one portion of the pt assistance forms. Once forms are signed, they will be sent to the provider to sign.

## 2020-07-15 DIAGNOSIS — R1110 Vomiting, unspecified: Secondary | ICD-10-CM | POA: Diagnosis not present

## 2020-07-15 DIAGNOSIS — R110 Nausea: Secondary | ICD-10-CM | POA: Diagnosis not present

## 2020-07-15 DIAGNOSIS — N2581 Secondary hyperparathyroidism of renal origin: Secondary | ICD-10-CM | POA: Diagnosis not present

## 2020-07-15 DIAGNOSIS — D509 Iron deficiency anemia, unspecified: Secondary | ICD-10-CM | POA: Diagnosis not present

## 2020-07-15 DIAGNOSIS — N186 End stage renal disease: Secondary | ICD-10-CM | POA: Diagnosis not present

## 2020-07-15 DIAGNOSIS — Z992 Dependence on renal dialysis: Secondary | ICD-10-CM | POA: Diagnosis not present

## 2020-07-16 DIAGNOSIS — Z992 Dependence on renal dialysis: Secondary | ICD-10-CM | POA: Diagnosis not present

## 2020-07-16 DIAGNOSIS — N186 End stage renal disease: Secondary | ICD-10-CM | POA: Diagnosis not present

## 2020-07-18 DIAGNOSIS — N2581 Secondary hyperparathyroidism of renal origin: Secondary | ICD-10-CM | POA: Diagnosis not present

## 2020-07-18 DIAGNOSIS — R110 Nausea: Secondary | ICD-10-CM | POA: Diagnosis not present

## 2020-07-18 DIAGNOSIS — Z992 Dependence on renal dialysis: Secondary | ICD-10-CM | POA: Diagnosis not present

## 2020-07-18 DIAGNOSIS — D631 Anemia in chronic kidney disease: Secondary | ICD-10-CM | POA: Diagnosis not present

## 2020-07-18 DIAGNOSIS — N186 End stage renal disease: Secondary | ICD-10-CM | POA: Diagnosis not present

## 2020-07-18 DIAGNOSIS — R1110 Vomiting, unspecified: Secondary | ICD-10-CM | POA: Diagnosis not present

## 2020-07-18 DIAGNOSIS — D509 Iron deficiency anemia, unspecified: Secondary | ICD-10-CM | POA: Diagnosis not present

## 2020-07-20 DIAGNOSIS — N186 End stage renal disease: Secondary | ICD-10-CM | POA: Diagnosis not present

## 2020-07-20 DIAGNOSIS — D509 Iron deficiency anemia, unspecified: Secondary | ICD-10-CM | POA: Diagnosis not present

## 2020-07-20 DIAGNOSIS — N2581 Secondary hyperparathyroidism of renal origin: Secondary | ICD-10-CM | POA: Diagnosis not present

## 2020-07-20 DIAGNOSIS — R1110 Vomiting, unspecified: Secondary | ICD-10-CM | POA: Diagnosis not present

## 2020-07-20 DIAGNOSIS — Z992 Dependence on renal dialysis: Secondary | ICD-10-CM | POA: Diagnosis not present

## 2020-07-20 DIAGNOSIS — R110 Nausea: Secondary | ICD-10-CM | POA: Diagnosis not present

## 2020-07-22 DIAGNOSIS — R110 Nausea: Secondary | ICD-10-CM | POA: Diagnosis not present

## 2020-07-22 DIAGNOSIS — Z992 Dependence on renal dialysis: Secondary | ICD-10-CM | POA: Diagnosis not present

## 2020-07-22 DIAGNOSIS — D509 Iron deficiency anemia, unspecified: Secondary | ICD-10-CM | POA: Diagnosis not present

## 2020-07-22 DIAGNOSIS — N186 End stage renal disease: Secondary | ICD-10-CM | POA: Diagnosis not present

## 2020-07-22 DIAGNOSIS — R1110 Vomiting, unspecified: Secondary | ICD-10-CM | POA: Diagnosis not present

## 2020-07-22 DIAGNOSIS — N2581 Secondary hyperparathyroidism of renal origin: Secondary | ICD-10-CM | POA: Diagnosis not present

## 2020-07-25 DIAGNOSIS — N186 End stage renal disease: Secondary | ICD-10-CM | POA: Diagnosis not present

## 2020-07-25 DIAGNOSIS — R110 Nausea: Secondary | ICD-10-CM | POA: Diagnosis not present

## 2020-07-25 DIAGNOSIS — N2581 Secondary hyperparathyroidism of renal origin: Secondary | ICD-10-CM | POA: Diagnosis not present

## 2020-07-25 DIAGNOSIS — D509 Iron deficiency anemia, unspecified: Secondary | ICD-10-CM | POA: Diagnosis not present

## 2020-07-25 DIAGNOSIS — R1110 Vomiting, unspecified: Secondary | ICD-10-CM | POA: Diagnosis not present

## 2020-07-25 DIAGNOSIS — Z992 Dependence on renal dialysis: Secondary | ICD-10-CM | POA: Diagnosis not present

## 2020-07-27 DIAGNOSIS — N186 End stage renal disease: Secondary | ICD-10-CM | POA: Diagnosis not present

## 2020-07-27 DIAGNOSIS — Z992 Dependence on renal dialysis: Secondary | ICD-10-CM | POA: Diagnosis not present

## 2020-07-27 DIAGNOSIS — N2581 Secondary hyperparathyroidism of renal origin: Secondary | ICD-10-CM | POA: Diagnosis not present

## 2020-07-27 DIAGNOSIS — D509 Iron deficiency anemia, unspecified: Secondary | ICD-10-CM | POA: Diagnosis not present

## 2020-07-27 DIAGNOSIS — R110 Nausea: Secondary | ICD-10-CM | POA: Diagnosis not present

## 2020-07-27 DIAGNOSIS — R1110 Vomiting, unspecified: Secondary | ICD-10-CM | POA: Diagnosis not present

## 2020-07-28 ENCOUNTER — Other Ambulatory Visit: Payer: Self-pay

## 2020-07-28 ENCOUNTER — Encounter: Payer: Self-pay | Admitting: Physical Therapy

## 2020-07-28 ENCOUNTER — Ambulatory Visit (INDEPENDENT_AMBULATORY_CARE_PROVIDER_SITE_OTHER): Payer: Medicare Other | Admitting: Physical Therapy

## 2020-07-28 DIAGNOSIS — R2681 Unsteadiness on feet: Secondary | ICD-10-CM

## 2020-07-28 DIAGNOSIS — M25671 Stiffness of right ankle, not elsewhere classified: Secondary | ICD-10-CM

## 2020-07-28 DIAGNOSIS — M25571 Pain in right ankle and joints of right foot: Secondary | ICD-10-CM | POA: Diagnosis not present

## 2020-07-28 DIAGNOSIS — R2689 Other abnormalities of gait and mobility: Secondary | ICD-10-CM

## 2020-07-28 NOTE — Patient Instructions (Signed)
Access Code: [email or web address] URL: [email or web address] Date: 07/28/2020 Prepared by: Faustino Congress  Exercises Seated Ankle Eversion with Resistance - 2 x daily - 7 x weekly - 20 reps - 1 sets Seated Ankle Plantar Flexion with Resistance Loop - 2 x daily - 7 x weekly - 1 sets - 20 reps Seated Ankle Dorsiflexion with Resistance - 2 x daily - 7 x weekly - 1 sets - 20 reps Seated Figure 4 Ankle Inversion with Resistance - 2 x daily - 7 x weekly - 1 sets - 20 reps Standing Single Leg Stance with Unilateral Counter Support - 2 x daily - 7 x weekly - 3 reps - 1 sets - 10 sec hold

## 2020-07-28 NOTE — Therapy (Signed)
[facility] [address] [place], [place], [address] Phone: [phone]   Fax:  [phone]  Physical Therapy Evaluation  Patient Details  Name: Patricia [name] MRN: [id number] Date of Birth: [birth year] Referring Provider (PT): Marybelle Killings, MD   Encounter Date: 07/28/2020   PT End of Session - 07/28/20 0926    Visit Number 1    Number of Visits 12    Date for PT Re-Evaluation 09/08/20    Authorization Type Medicare/Aetna    PT Start Time 0846    PT Stop Time 0920    PT Time Calculation (min) 34 min    Activity Tolerance Patient tolerated treatment well    Behavior During Therapy [facility] for tasks assessed/performed           Past Medical History:  Diagnosis Date   Anemia    Ankle fracture    Arthritis    Blood transfusion without reported diagnosis    Breast cancer ([place])    Cancer ([place])    Diabetes mellitus without complication ([place])    Dialysis patient ([place])    mon, wed, friday,    End stage renal disease on dialysis ([place])    M/W/F [facility] in [place]   GERD (gastroesophageal reflux disease)    Hypertension    OSA (obstructive sleep apnea)    uses CPAP sometimes   Pneumonia    PONV (postoperative nausea and vomiting)    Wears glasses     Past Surgical History:  Procedure Laterality Date   ABDOMINAL HYSTERECTOMY     AV FISTULA PLACEMENT  11/2014   at [place] N/A 07/10/2016   Procedure: BALLOON DILATION;  Surgeon: Danie Binder, MD;  [facility];  Service: Endoscopy;  Laterality: N/A;  Pyloric dilation   BREAST LUMPECTOMY     CESAREAN SECTION     CHOLECYSTECTOMY     COLONOSCOPY WITH PROPOFOL N/A 09/27/2016   Dr. Gala Romney: Internal hemorrhoids repeat colonoscopy in 10 years   DILATION AND CURETTAGE OF UTERUS     ESOPHAGOGASTRODUODENOSCOPY N/A 07/10/2016   Dr.Fields- normal esophagus, gastric stenosis was found at the pylorus, gastritis on bx, normal examined duodenun    EXTERNAL FIXATION REMOVAL Right 10/29/2018   Procedure: REMOVAL RIGHT ANKLE BIOMET ZIMMER EXTERNAL FIXATOR, SHORT LEG CAST APPLICATION;  Surgeon: Marybelle Killings, MD;  Location: [place];  Service: Orthopedics;  Laterality: Right;   MASTECTOMY     left sided   ORIF ANKLE FRACTURE Right 10/06/2018   Procedure: OPEN REDUCTION INTERNAL FIXATION (ORIF) RIGHT ANKLE TRIMALLEOLAR;  Surgeon: Marybelle Killings, MD;  Location: [place];  Service: Orthopedics;  Laterality: Right;    There were no vitals filed for this visit.    Subjective Assessment - 07/28/20 0850    Subjective Pt is a 49 y/o female who presents to OPPT s/cp Rt ankle ORIF following trimalleolar fx on 10/04/2020.  Pt reports MD doesn't recommend hardware removal due to medical comorbidities, and she has continued swelling, pain and weakness.  She c/o difficulty with negotiating stairs and uphill walking.    Limitations Walking   steps   Patient Stated Goals improve pain    Currently in Pain? Yes    Pain Score 6    up to 10/10; at best 6/10   Pain Location Ankle    Pain Orientation Right    Pain Descriptors / Indicators Sharp    Pain Type Chronic pain    Pain Onset More than a month  ago    Pain Frequency Constant    Aggravating Factors  steps, curbs, uphill, walking    Pain Relieving Factors tylenol, rest              [facility] PT Assessment - 07/28/20 0854      Assessment   Medical Diagnosis Post trimalleolar ankle ORIf     Referring Provider (PT) Marybelle Killings, MD    Onset Date/Surgical Date 10/06/18    Hand Dominance Right    Next MD Visit PRN    Prior Therapy none      Precautions   Precautions None      Restrictions   Weight Bearing Restrictions No      Balance Screen   Has the patient fallen in the past 6 months Yes    How many times? 2    Has the patient had a decrease in activity level because of a fear of falling?  Yes    Is the patient reluctant to leave their home because of a fear of falling?  No      Home  Environment   Living Environment Private residence    Living Arrangements Children   adult son - independent   Type of Home Apartment    Home Access Level entry    Home Layout One level      Prior Function   Level of [place] On disability    Leisure "nothing" tries to walk some for exercise      Cognition   Overall Cognitive Status Within Functional Limits for tasks assessed      Functional Tests   Functional tests Single leg stance      Single Leg Stance   Comments RLE: 5 sec; LLE: 9 sec      Posture/Postural Control   Posture/Postural Control Postural limitations    Postural Limitations Rounded Shoulders;Forward head      ROM / Strength   AROM / PROM / Strength AROM;Strength;PROM      AROM   AROM Assessment Site Ankle    Right/Left Ankle Right    Right Ankle Dorsiflexion 0    Right Ankle Plantar Flexion 52    Right Ankle Inversion 13    Right Ankle Eversion 20      PROM   PROM Assessment Site Ankle    Right/Left Ankle Right    Right Ankle Dorsiflexion 8    Right Ankle Plantar Flexion 55    Right Ankle Inversion 21    Right Ankle Eversion 25      Strength   Strength Assessment Site Ankle    Right/Left Ankle Right    Right Ankle Dorsiflexion 3-/5    Right Ankle Plantar Flexion 2/5    Right Ankle Inversion 3-/5    Right Ankle Eversion 3-/5      Palpation   Palpation comment tenderness noted proximal incision prominence; generalized swelling noted Rt ankle      Ambulation/Gait   Gait Comments decreased dorsiflexion in stance with decreased push off on RLE                      Objective measurements completed on examination: See above findings.       [facility] Adult PT Treatment/Exercise - 07/28/20 0854      Exercises   Exercises Ankle      Ankle Exercises: Standing   SLS RLE only 2 x 5-10 sec      Ankle Exercises:  Seated   Other Seated Ankle Exercises L2 ankle 4 way RLE 5 reps each direction                   PT Education - 07/28/20 0926    Education Details HEP    Person(s) Educated Patient    Methods Explanation;Demonstration;Handout    Comprehension Verbalized understanding;Returned demonstration;Need further instruction            PT Short Term Goals - 07/28/20 1044      PT SHORT TERM GOAL #1   Title report compliance with HEP    Status New    Target Date 08/11/20             PT [place] Term Goals - 07/28/20 1045      PT [place] TERM GOAL #1   Title independent with HEP    Status New    Target Date 09/08/20      PT [place] TERM GOAL #2   Title improve Rt ankle DF AROM to at least 4 deg past neutral for improved function    Status New    Target Date 09/08/20      PT [place] TERM GOAL #3   Title report pain < 6/10 with ascending/descending stairs or curb for improved function    Status New    Target Date 09/08/20      PT [place] TERM GOAL #4   Title improve Rt ankle strength to at least 4/5 for improved mobility    Status New    Target Date 09/08/20      PT [place] TERM GOAL #5   Title demonstrate RLE SLS to at least 8 sec for improved balance and strength    Status New    Target Date 09/08/20                  Plan - 07/28/20 0911    Clinical Impression Statement Pt is a 49 y/o female who presents to [facility] for chronic Rt ankle pain following ORIF in Oct 2019.  Pt demonstrates decreased ROM and strength, residual swelling and continued pain affecting functional mobility especially with incline/decline surfaces.  Pt will benefit from PT to address deficits listed.    Personal Factors and Comorbidities Comorbidity 3+    Comorbidities breast cancer, DM, ESRD on dialysis M/W/F, HTN, OSA    Examination-Activity Limitations Locomotion Level;Stairs;Stand    Examination-Participation Restrictions Community Activity    Stability/Clinical Decision Making Evolving/Moderate complexity    Clinical Decision Making Moderate    Rehab Potential Good    PT Frequency 1x /  week   recommend 1-2x/wk; likely 1x/wk due to limited availability from dialysis   PT Duration 6 weeks    PT Treatment/Interventions ADLs/Self Care Home Management;Cryotherapy;Electrical Stimulation;Ultrasound;Moist Heat;Iontophoresis 4mg /ml Dexamethasone;Gait training;Stair training;Functional mobility training;Therapeutic activities;Therapeutic exercise;Balance training;Patient/family education;Neuromuscular re-education;Manual techniques;Vasopneumatic Device;Taping;Dry needling;Passive range of motion    PT Next Visit Plan review HEP, progress weight bearing exercises, dorsiflexion ROM    PT Home Exercise Plan Access Code: [email or web address]    Consulted and Agree with Plan of Care Patient           Patient will benefit from skilled therapeutic intervention in order to improve the following deficits and impairments:  Abnormal gait, Pain, Decreased mobility, Decreased range of motion, Decreased strength, Impaired flexibility, Increased edema, Difficulty walking, Decreased balance, Decreased activity tolerance  Visit Diagnosis: Pain in right ankle and joints of right foot - Plan: PT plan of care cert/re-cert  Stiffness of right  ankle, not elsewhere classified - Plan: PT plan of care cert/re-cert  Other abnormalities of gait and mobility - Plan: PT plan of care cert/re-cert  Unsteadiness on feet - Plan: PT plan of care cert/re-cert     Problem List Patient Active Problem List   Diagnosis Date Noted   Acute pulmonary edema ([place]) 01/11/2020   OSA (obstructive sleep apnea)    Closed right ankle fracture 10/06/2018   Closed displaced trimalleolar fracture of right ankle 10/06/2018   Acute colitis 02/26/2018   Hypoxemia 02/24/2018   Dyspnea 11/26/2017   Anemia 11/26/2017   Elevated troponin 11/26/2017   Hyperkalemia 09/29/2017   Acute respiratory failure ([place]) 09/28/2017   CAP (community acquired pneumonia) 09/26/2017   Volume overload 09/25/2017   Acute on chronic diastolic  CHF (congestive heart failure) ([place]) 09/25/2017   Lactic acidosis 09/25/2017   Hypokalemia 09/25/2017   Breast cancer ([place]) 07/22/2017   SOB (shortness of breath)    Acute respiratory failure with hypoxia ([place]) 07/21/2017   Flatulence 10/25/2016   GERD (gastroesophageal reflux disease) 08/02/2016   Pyloric stenosis, acquired    Nausea with vomiting    Generalized abdominal pain    Chest pain, rule out acute myocardial infarction 07/04/2016   Constipation 07/04/2016   Nausea 07/04/2016   Essential hypertension 07/04/2016   HCAP (healthcare-associated pneumonia) 12/04/2015   End stage renal disease on dialysis ([place]) 12/04/2015   Type 2 diabetes mellitus ([place]) 12/04/2015      Laureen Abrahams, PT, DPT 07/28/20 10:52 AM     [facility] [address] [place], [place], [address] Phone: [phone]   Fax:  [phone]  Name: Patricia [name] MRN: [id number] Date of Birth: [birth year]

## 2020-07-29 ENCOUNTER — Telehealth: Payer: Self-pay | Admitting: Internal Medicine

## 2020-07-29 ENCOUNTER — Telehealth: Payer: Self-pay

## 2020-07-29 DIAGNOSIS — Z992 Dependence on renal dialysis: Secondary | ICD-10-CM | POA: Diagnosis not present

## 2020-07-29 DIAGNOSIS — N2581 Secondary hyperparathyroidism of renal origin: Secondary | ICD-10-CM | POA: Diagnosis not present

## 2020-07-29 DIAGNOSIS — R1110 Vomiting, unspecified: Secondary | ICD-10-CM | POA: Diagnosis not present

## 2020-07-29 DIAGNOSIS — D509 Iron deficiency anemia, unspecified: Secondary | ICD-10-CM | POA: Diagnosis not present

## 2020-07-29 DIAGNOSIS — N186 End stage renal disease: Secondary | ICD-10-CM | POA: Diagnosis not present

## 2020-07-29 DIAGNOSIS — R110 Nausea: Secondary | ICD-10-CM | POA: Diagnosis not present

## 2020-07-29 NOTE — Telephone Encounter (Signed)
Ok for her to continue Linzess 290 mcg samples as she has been. Make sure taking MiraLAX twice daily. We will have to wait on the process to finish on Amitiza and unfortunately we don't have samples. (As an aside, can we try and contact the rep for Amitiza and see if they can bring some samples by?)  Abdominal pain likely from constipation.  Further reccs per AB on her return.

## 2020-07-29 NOTE — Telephone Encounter (Signed)
Received a call from Ramiro Harvest PA from the dialysis center where pt has dialysis. During pts treatment, she got a severe abdominal cramp that brought her to tears and pt wasn't able to talk until the cramp passed. Pt took a Bentyl tab when cramp started and only takes it as needed. Pt told the PA that she hadn't had a BM for 5 days. Pt is wanting on samples and pt assistance approval for Amitiza. Pt is taking Linzess 290 mcg and miralax as needed. Pt ran out of samples. Samples of Linzess 290 mcg are ready for pickup and pt will stop by today to get them. Please advise in the absence of Roseanne Kaufman, NP

## 2020-07-29 NOTE — Telephone Encounter (Signed)
ERROR

## 2020-07-31 DIAGNOSIS — D509 Iron deficiency anemia, unspecified: Secondary | ICD-10-CM | POA: Diagnosis not present

## 2020-07-31 DIAGNOSIS — N2581 Secondary hyperparathyroidism of renal origin: Secondary | ICD-10-CM | POA: Diagnosis not present

## 2020-07-31 DIAGNOSIS — R110 Nausea: Secondary | ICD-10-CM | POA: Diagnosis not present

## 2020-07-31 DIAGNOSIS — N186 End stage renal disease: Secondary | ICD-10-CM | POA: Diagnosis not present

## 2020-07-31 DIAGNOSIS — R1110 Vomiting, unspecified: Secondary | ICD-10-CM | POA: Diagnosis not present

## 2020-07-31 DIAGNOSIS — Z992 Dependence on renal dialysis: Secondary | ICD-10-CM | POA: Diagnosis not present

## 2020-08-01 DIAGNOSIS — N186 End stage renal disease: Secondary | ICD-10-CM | POA: Diagnosis not present

## 2020-08-01 DIAGNOSIS — R1110 Vomiting, unspecified: Secondary | ICD-10-CM | POA: Diagnosis not present

## 2020-08-01 DIAGNOSIS — N2581 Secondary hyperparathyroidism of renal origin: Secondary | ICD-10-CM | POA: Diagnosis not present

## 2020-08-01 DIAGNOSIS — Z992 Dependence on renal dialysis: Secondary | ICD-10-CM | POA: Diagnosis not present

## 2020-08-01 DIAGNOSIS — D509 Iron deficiency anemia, unspecified: Secondary | ICD-10-CM | POA: Diagnosis not present

## 2020-08-01 DIAGNOSIS — R110 Nausea: Secondary | ICD-10-CM | POA: Diagnosis not present

## 2020-08-01 NOTE — Telephone Encounter (Signed)
Noted. Continue Linzess.

## 2020-08-01 NOTE — Telephone Encounter (Signed)
Spoke with pt. Pt stopped by and got samples of Linzess 290 mcg Friday and reports she had a good BM the next day. Pt says it helped with the constipation. Pt is going to taking Miralax twice daily. Pt was previously taking it twice daily. Pt is aware that we haven't heard back from pt assistance and will look into this.

## 2020-08-03 ENCOUNTER — Other Ambulatory Visit: Payer: Self-pay

## 2020-08-03 ENCOUNTER — Emergency Department (HOSPITAL_COMMUNITY): Payer: Medicare Other

## 2020-08-03 ENCOUNTER — Observation Stay (HOSPITAL_COMMUNITY)
Admission: EM | Admit: 2020-08-03 | Discharge: 2020-08-04 | Disposition: A | Discharge status: Home / Self Care | Payer: Medicare Other | Attending: Emergency Medicine | Admitting: Emergency Medicine

## 2020-08-03 ENCOUNTER — Encounter (HOSPITAL_COMMUNITY): Payer: Self-pay | Admitting: Emergency Medicine

## 2020-08-03 DIAGNOSIS — D649 Anemia, unspecified: Secondary | ICD-10-CM | POA: Diagnosis present

## 2020-08-03 DIAGNOSIS — D631 Anemia in chronic kidney disease: Secondary | ICD-10-CM | POA: Diagnosis not present

## 2020-08-03 DIAGNOSIS — I132 Hypertensive heart and chronic kidney disease with heart failure and with stage 5 chronic kidney disease, or end stage renal disease: Secondary | ICD-10-CM | POA: Insufficient documentation

## 2020-08-03 DIAGNOSIS — R0602 Shortness of breath: Secondary | ICD-10-CM | POA: Diagnosis not present

## 2020-08-03 DIAGNOSIS — G4489 Other headache syndrome: Secondary | ICD-10-CM | POA: Diagnosis not present

## 2020-08-03 DIAGNOSIS — I10 Essential (primary) hypertension: Secondary | ICD-10-CM | POA: Diagnosis not present

## 2020-08-03 DIAGNOSIS — I959 Hypotension, unspecified: Secondary | ICD-10-CM | POA: Insufficient documentation

## 2020-08-03 DIAGNOSIS — E1122 Type 2 diabetes mellitus with diabetic chronic kidney disease: Secondary | ICD-10-CM | POA: Insufficient documentation

## 2020-08-03 DIAGNOSIS — N186 End stage renal disease: Secondary | ICD-10-CM | POA: Insufficient documentation

## 2020-08-03 DIAGNOSIS — Z992 Dependence on renal dialysis: Secondary | ICD-10-CM | POA: Diagnosis not present

## 2020-08-03 DIAGNOSIS — G4733 Obstructive sleep apnea (adult) (pediatric): Secondary | ICD-10-CM | POA: Diagnosis present

## 2020-08-03 DIAGNOSIS — Z20822 Contact with and (suspected) exposure to covid-19: Secondary | ICD-10-CM | POA: Insufficient documentation

## 2020-08-03 DIAGNOSIS — R569 Unspecified convulsions: Secondary | ICD-10-CM | POA: Diagnosis not present

## 2020-08-03 DIAGNOSIS — R519 Headache, unspecified: Secondary | ICD-10-CM | POA: Diagnosis not present

## 2020-08-03 DIAGNOSIS — R110 Nausea: Secondary | ICD-10-CM | POA: Diagnosis not present

## 2020-08-03 DIAGNOSIS — D509 Iron deficiency anemia, unspecified: Secondary | ICD-10-CM | POA: Diagnosis not present

## 2020-08-03 DIAGNOSIS — R531 Weakness: Secondary | ICD-10-CM | POA: Diagnosis present

## 2020-08-03 DIAGNOSIS — Z853 Personal history of malignant neoplasm of breast: Secondary | ICD-10-CM | POA: Diagnosis not present

## 2020-08-03 DIAGNOSIS — R55 Syncope and collapse: Principal | ICD-10-CM | POA: Insufficient documentation

## 2020-08-03 DIAGNOSIS — R4020 Unspecified coma: Secondary | ICD-10-CM | POA: Diagnosis not present

## 2020-08-03 DIAGNOSIS — R1110 Vomiting, unspecified: Secondary | ICD-10-CM | POA: Diagnosis not present

## 2020-08-03 DIAGNOSIS — I5032 Chronic diastolic (congestive) heart failure: Secondary | ICD-10-CM | POA: Diagnosis not present

## 2020-08-03 DIAGNOSIS — E119 Type 2 diabetes mellitus without complications: Secondary | ICD-10-CM

## 2020-08-03 DIAGNOSIS — N2581 Secondary hyperparathyroidism of renal origin: Secondary | ICD-10-CM | POA: Diagnosis not present

## 2020-08-03 DIAGNOSIS — J9811 Atelectasis: Secondary | ICD-10-CM | POA: Diagnosis not present

## 2020-08-03 LAB — TROPONIN I (HIGH SENSITIVITY)
Troponin I (High Sensitivity): 43 ng/L — ABNORMAL HIGH (ref <18)
Troponin I (High Sensitivity): 43 ng/L — ABNORMAL HIGH (ref <18)

## 2020-08-03 LAB — CBC WITH DIFFERENTIAL/PLATELET
Abs Immature Granulocytes: 0.03 10*3/uL (ref 0.00–0.07)
Basophils Absolute: 0 10*3/uL (ref 0.0–0.1)
Basophils Relative: 0 %
Eosinophils Absolute: 0.2 10*3/uL (ref 0.0–0.5)
Eosinophils Relative: 2 %
HCT: 34.6 % — ABNORMAL LOW (ref 36.0–46.0)
Hemoglobin: 10.6 g/dL — ABNORMAL LOW (ref 12.0–15.0)
Immature Granulocytes: 0 %
Lymphocytes Relative: 16 %
Lymphs Abs: 1.3 10*3/uL (ref 0.7–4.0)
MCH: 28.4 pg (ref 26.0–34.0)
MCHC: 30.6 g/dL (ref 30.0–36.0)
MCV: 92.8 fL (ref 80.0–100.0)
Monocytes Absolute: 0.4 10*3/uL (ref 0.1–1.0)
Monocytes Relative: 5 %
Neutro Abs: 6.4 10*3/uL (ref 1.7–7.7)
Neutrophils Relative %: 77 %
Platelets: 236 10*3/uL (ref 150–400)
RBC: 3.73 MIL/uL — ABNORMAL LOW (ref 3.87–5.11)
RDW: 13.3 % (ref 11.5–15.5)
WBC: 8.4 10*3/uL (ref 4.0–10.5)
nRBC: 0 % (ref 0.0–0.2)

## 2020-08-03 LAB — SARS CORONAVIRUS 2 BY RT PCR (HOSPITAL ORDER, PERFORMED IN ~~LOC~~ HOSPITAL LAB): SARS Coronavirus 2: NEGATIVE

## 2020-08-03 LAB — COMPREHENSIVE METABOLIC PANEL
ALT: 14 U/L (ref 0–44)
AST: 12 U/L — ABNORMAL LOW (ref 15–41)
Albumin: 4.1 g/dL (ref 3.5–5.0)
Alkaline Phosphatase: 121 U/L (ref 38–126)
Anion gap: 15 (ref 5–15)
BUN: 42 mg/dL — ABNORMAL HIGH (ref 6–20)
CO2: 23 mmol/L (ref 22–32)
Calcium: 9.8 mg/dL (ref 8.9–10.3)
Chloride: 101 mmol/L (ref 98–111)
Creatinine, Ser: 8.01 mg/dL — ABNORMAL HIGH (ref 0.44–1.00)
GFR calc Af Amer: 6 mL/min — ABNORMAL LOW (ref >60)
GFR calc non Af Amer: 5 mL/min — ABNORMAL LOW (ref >60)
Glucose, Bld: 185 mg/dL — ABNORMAL HIGH (ref 70–99)
Potassium: 4.4 mmol/L (ref 3.5–5.1)
Sodium: 139 mmol/L (ref 135–145)
Total Bilirubin: 0.6 mg/dL (ref 0.3–1.2)
Total Protein: 8 g/dL (ref 6.5–8.1)

## 2020-08-03 LAB — CBG MONITORING, ED: Glucose-Capillary: 168 mg/dL — ABNORMAL HIGH (ref 70–99)

## 2020-08-03 MED ORDER — SEVELAMER CARBONATE 800 MG PO TABS: 800.0000 mg | ORAL_TABLET | ORAL | Status: DC

## 2020-08-03 MED ORDER — SEVELAMER CARBONATE 800 MG PO TABS
2400.0000 mg | ORAL_TABLET | Freq: Every day | ORAL | Status: DC
  Administered 2020-08-04: 2400 mg via ORAL
  Filled 2020-08-03: qty 3

## 2020-08-03 MED ORDER — PANTOPRAZOLE SODIUM 40 MG PO TBEC
40.0000 mg | DELAYED_RELEASE_TABLET | Freq: Every day | ORAL | Status: DC
  Administered 2020-08-03 – 2020-08-04 (×2): 40 mg via ORAL
  Filled 2020-08-03 (×2): qty 1

## 2020-08-03 MED ORDER — DOCUSATE SODIUM 100 MG PO CAPS: 100.0000 mg | ORAL_CAPSULE | Freq: Every day | ORAL | Status: DC | PRN

## 2020-08-03 MED ORDER — SODIUM CHLORIDE 0.9% FLUSH
3.0000 mL | Freq: Two times a day (BID) | INTRAVENOUS | Status: DC
  Administered 2020-08-03 – 2020-08-04 (×2): 3 mL via INTRAVENOUS

## 2020-08-03 MED ORDER — ALBUTEROL SULFATE (2.5 MG/3ML) 0.083% IN NEBU
2.5000 mg | INHALATION_SOLUTION | Freq: Four times a day (QID) | RESPIRATORY_TRACT | Status: DC | PRN
  Administered 2020-08-03: 2.5 mg via RESPIRATORY_TRACT
  Filled 2020-08-03: qty 3

## 2020-08-03 MED ORDER — ACETAMINOPHEN 500 MG PO TABS
1000.0000 mg | ORAL_TABLET | Freq: Once | ORAL | Status: AC
  Administered 2020-08-03: 1000 mg via ORAL
  Filled 2020-08-03: qty 2

## 2020-08-03 MED ORDER — ONDANSETRON 4 MG PO TBDP: 4.0000 mg | ORAL_TABLET | Freq: Three times a day (TID) | ORAL | Status: DC | PRN

## 2020-08-03 MED ORDER — LINACLOTIDE 145 MCG PO CAPS
290.0000 ug | ORAL_CAPSULE | Freq: Every day | ORAL | Status: DC
  Administered 2020-08-04: 290 ug via ORAL
  Filled 2020-08-03: qty 2

## 2020-08-03 MED ORDER — HEPARIN SODIUM (PORCINE) 5000 UNIT/ML IJ SOLN
5000.0000 [IU] | Freq: Three times a day (TID) | INTRAMUSCULAR | Status: DC
  Administered 2020-08-03 – 2020-08-04 (×3): 5000 [IU] via SUBCUTANEOUS
  Filled 2020-08-03 (×3): qty 1

## 2020-08-03 MED ORDER — RENA-VITE PO TABS
1.0000 | ORAL_TABLET | Freq: Every day | ORAL | Status: DC
  Administered 2020-08-04: 1 via ORAL
  Filled 2020-08-03: qty 1

## 2020-08-03 MED ORDER — FUROSEMIDE 40 MG PO TABS
40.0000 mg | ORAL_TABLET | Freq: Every evening | ORAL | Status: DC
  Administered 2020-08-03: 40 mg via ORAL
  Filled 2020-08-03: qty 1

## 2020-08-03 MED ORDER — NIFEDIPINE ER OSMOTIC RELEASE 30 MG PO TB24
60.0000 mg | ORAL_TABLET | Freq: Every evening | ORAL | Status: DC
  Administered 2020-08-03: 60 mg via ORAL
  Filled 2020-08-03: qty 2

## 2020-08-03 MED ORDER — BISACODYL 5 MG PO TBEC: 5.0000 mg | DELAYED_RELEASE_TABLET | Freq: Every day | ORAL | Status: DC | PRN

## 2020-08-03 MED ORDER — ALBUTEROL SULFATE HFA 108 (90 BASE) MCG/ACT IN AERS: 2.0000 | INHALATION_SPRAY | Freq: Four times a day (QID) | RESPIRATORY_TRACT | Status: DC | PRN

## 2020-08-03 MED ORDER — SEVELAMER CARBONATE 800 MG PO TABS
1600.0000 mg | ORAL_TABLET | Freq: Two times a day (BID) | ORAL | Status: DC
  Administered 2020-08-03 – 2020-08-04 (×2): 1600 mg via ORAL
  Filled 2020-08-03 (×2): qty 2

## 2020-08-03 MED ORDER — ROPINIROLE HCL 1 MG PO TABS
1.0000 mg | ORAL_TABLET | Freq: Two times a day (BID) | ORAL | Status: DC
  Administered 2020-08-03 – 2020-08-04 (×2): 1 mg via ORAL
  Filled 2020-08-03 (×2): qty 1

## 2020-08-03 MED ORDER — PLECANATIDE 3 MG PO TABS: 1.0000 | ORAL_TABLET | Freq: Every day | ORAL | Status: DC

## 2020-08-03 MED ORDER — IPRATROPIUM BROMIDE 0.03 % NA SOLN
2.0000 | Freq: Two times a day (BID) | NASAL | Status: DC
  Filled 2020-08-03: qty 30

## 2020-08-03 MED ORDER — FUROSEMIDE 40 MG PO TABS
80.0000 mg | ORAL_TABLET | Freq: Every day | ORAL | Status: DC
  Filled 2020-08-03: qty 2

## 2020-08-03 MED ORDER — FERRIC CITRATE 1 GM 210 MG(FE) PO TABS
420.0000 mg | ORAL_TABLET | Freq: Two times a day (BID) | ORAL | Status: DC
  Administered 2020-08-04: 420 mg via ORAL
  Filled 2020-08-03 (×3): qty 2

## 2020-08-03 MED ORDER — POLYETHYLENE GLYCOL 3350 17 G PO PACK: 17.0000 g | PACK | Freq: Every day | ORAL | Status: DC | PRN

## 2020-08-03 NOTE — ED Notes (Signed)
Pt. States their hands feel numb. Pt states they experienced the numbness in both hands yesterday and that it went away. Notified MD.

## 2020-08-03 NOTE — ED Provider Notes (Signed)
[facility] EMERGENCY DEPARTMENT Provider Note   CSN: [id number] Arrival date & time: 08/03/20  4580     History Chief Complaint  Patient presents with  . Weakness    Patricia [name] is a 49 y.o. female with a history of diabetes, end-stage renal disease on dialysis, GERD, hypertension, underwent dialysis this morning during which time she had a syncopal event.  When she woke she had a right sided headache and was told that she was extremely hypotensive during this event.  She states that approximately 2 weeks ago her dialysis treatment changed and they increased her dry weight as she has been having problems with hypotension with her treatments.  Despite these adjustments her symptoms are getting worse.  She does endorse episodes of chest pressure most recently 2 days ago, did not experience this this morning during dialysis, also denies shortness of breath or focal weakness.  She does not recall having palpitations or shortness of breath this morning with this episode.  She does endorse feeling very weak and "not right" all morning.  She was transferred here directly from dialysis for further evaluation.  Per patient's report she was given IV fluids at dialysis prior to transferring here.  HPI     Past Medical History:  Diagnosis Date  . Anemia   . Ankle fracture   . Arthritis   . Blood transfusion without reported diagnosis   . Breast cancer ([place])   . Cancer ([place])   . Diabetes mellitus without complication ([place])   . Dialysis patient ([place])    mon, wed, friday,   . End stage renal disease on dialysis [facility])    M/W/F [facility] in [place]  . GERD (gastroesophageal reflux disease)   . Hypertension   . OSA (obstructive sleep apnea)    uses CPAP sometimes  . Pneumonia   . PONV (postoperative nausea and vomiting)   . Wears glasses     Patient Active Problem List   Diagnosis Date Noted  . Acute pulmonary edema ([place]) 01/11/2020  . OSA (obstructive sleep apnea)   . Closed right ankle  fracture 10/06/2018  . Closed displaced trimalleolar fracture of right ankle 10/06/2018  . Acute colitis 02/26/2018  . Hypoxemia 02/24/2018  . Dyspnea 11/26/2017  . Anemia 11/26/2017  . Elevated troponin 11/26/2017  . Hyperkalemia 09/29/2017  . Acute respiratory failure ([place]) 09/28/2017  . CAP (community acquired pneumonia) 09/26/2017  . Volume overload 09/25/2017  . Acute on chronic diastolic CHF (congestive heart failure) ([place]) 09/25/2017  . Lactic acidosis 09/25/2017  . Hypokalemia 09/25/2017  . Breast cancer ([place]) 07/22/2017  . SOB (shortness of breath)   . Acute respiratory failure with hypoxia ([place]) 07/21/2017  . Flatulence 10/25/2016  . GERD (gastroesophageal reflux disease) 08/02/2016  . Pyloric stenosis, acquired   . Nausea with vomiting   . Generalized abdominal pain   . Chest pain, rule out acute myocardial infarction 07/04/2016  . Constipation 07/04/2016  . Nausea 07/04/2016  . Essential hypertension 07/04/2016  . HCAP (healthcare-associated pneumonia) 12/04/2015  . End stage renal disease on dialysis ([place]) 12/04/2015  . Type 2 diabetes mellitus ([place]) 12/04/2015    Past Surgical History:  Procedure Laterality Date  . ABDOMINAL HYSTERECTOMY    . AV FISTULA PLACEMENT  11/2014   at [place] N/A 07/10/2016   Procedure: BALLOON DILATION;  Surgeon: Danie Binder, MD;  Location: [facility];  Service: Endoscopy;  Laterality: N/A;  Pyloric dilation  . BREAST LUMPECTOMY    .  CESAREAN SECTION    . CHOLECYSTECTOMY    . COLONOSCOPY WITH PROPOFOL N/A 09/27/2016   Dr. Gala Romney: Internal hemorrhoids repeat colonoscopy in 10 years  . DILATION AND CURETTAGE OF UTERUS    . ESOPHAGOGASTRODUODENOSCOPY N/A 07/10/2016   Dr.Fields- normal esophagus, gastric stenosis was found at the pylorus, gastritis on bx, normal examined duodenun  . EXTERNAL FIXATION REMOVAL Right 10/29/2018   Procedure: REMOVAL RIGHT ANKLE BIOMET ZIMMER EXTERNAL FIXATOR, SHORT LEG CAST  APPLICATION;  Surgeon: Marybelle Killings, MD;  Location: [place];  Service: Orthopedics;  Laterality: Right;  . MASTECTOMY     left sided  . ORIF ANKLE FRACTURE Right 10/06/2018   Procedure: OPEN REDUCTION INTERNAL FIXATION (ORIF) RIGHT ANKLE TRIMALLEOLAR;  Surgeon: Marybelle Killings, MD;  Location: [place];  Service: Orthopedics;  Laterality: Right;     OB History    Gravida  3   Para  2   Term  1   Preterm  1   AB  1   Living        SAB  1   TAB      Ectopic      Multiple      Live Births              Family History  Problem Relation Age of Onset  . Diabetes Mellitus II Mother   . Hypertension Mother   . Hypertension Sister   . Hypertension Sister   . Colon cancer Neg Hx     Social History   Tobacco Use  . Smoking status: Never Smoker  . Smokeless tobacco: Never Used  Vaping Use  . Vaping Use: Never used  Substance Use Topics  . Alcohol use: No    Alcohol/week: 0.0 standard drinks  . Drug use: No    Home Medications Prior to Admission medications   Medication Sig Start Date End Date Taking? Authorizing Provider  albuterol (PROVENTIL HFA;VENTOLIN HFA) 108 (90 Base) MCG/ACT inhaler Inhale 2 puffs into the lungs every 6 (six) hours as needed for wheezing or shortness of breath. 11/28/17   Kathie Dike, MD  albuterol (PROVENTIL) (2.5 MG/3ML) 0.083% nebulizer solution Take 3 mLs (2.5 mg total) by nebulization every 6 (six) hours as needed for wheezing or shortness of breath. 11/28/17   Kathie Dike, MD  bisacodyl (DULCOLAX) 5 MG EC tablet Take 5 mg by mouth daily as needed for moderate constipation.    [provider]  docusate sodium (COLACE) 100 MG capsule Take 100 mg by mouth daily as needed for mild constipation.    [provider]  Ferric Citrate (AURYXIA) 1 GM 210 MG(Fe) TABS Take 2 tablets by mouth 2 (two) times daily with a meal. With Breakfast & with supper    [provider]  FLOVENT HFA 110 MCG/ACT inhaler 1 puff 2 (two)  times daily. 12/04/19   [provider]  furosemide (LASIX) 40 MG tablet Take 40-80 mg by mouth 2 (two) times daily. Take 2 tablets (80mg ) in the morning and take 1 tablet (40mg ) at bedtime    [provider]  glipiZIDE (GLUCOTROL) 5 MG tablet Take 5 mg by mouth 2 (two) times daily as needed. For blood sugar levels over 150    [provider]  ipratropium (ATROVENT) 0.03 % nasal spray Place 2 sprays into both nostrils 2 (two) times daily.  06/06/19   [provider]  linaclotide (LINZESS) 290 MCG CAPS capsule Take 290 mcg by mouth daily before breakfast.  [provider]  multivitamin (RENA-VIT) TABS tablet Take 1 tablet by mouth daily.    [provider]  NIFEdipine (ADALAT CC) 60 MG 24 hr tablet Take 1 tablet (60 mg total) by mouth every evening. 01/02/20   Hongalgi, Lenis Dickinson, MD  ondansetron (ZOFRAN-ODT) 4 MG disintegrating tablet Take 4 mg by mouth every 8 (eight) hours as needed for nausea or vomiting.    [provider]  pantoprazole (PROTONIX) 40 MG tablet Take 40 mg by mouth daily.    [provider]  polyethylene glycol (MIRALAX) 17 g packet Take 17 g by mouth daily as needed for mild constipation. 01/02/20   Hongalgi, Lenis Dickinson, MD  rOPINIRole (REQUIP XL) 2 MG 24 hr tablet Take 2 mg by mouth at bedtime.  09/10/16   [provider]  sevelamer carbonate (RENVELA) 800 MG tablet Take 800 mg by mouth See admin instructions. Take 2 tablets (1600 mg) in the morning, take 3 tablets (2400 mg) with lunch or snack,  and take 2 tablets (1600 mg) by mouth in the evening with dinner meal.    [provider]  sodium chloride (OCEAN) 0.65 % SOLN nasal spray Place 1 spray into both nostrils as needed for congestion. Patient taking differently: Place 1 spray into both nostrils daily as needed for congestion.  01/02/20   Hongalgi, Lenis Dickinson, MD  TRULANCE 3 MG TABS TAKE 1 TABLET BY MOUTH EVERY DAY Patient not taking: Reported on  07/28/2020 01/27/20   Annitta Needs, NP    Allergies    Amlodipine besylate and Reglan [metoclopramide]  Review of Systems   Review of Systems  Constitutional: Negative for chills and fever.  HENT: Negative for congestion and sore throat.   Eyes: Negative.   Respiratory: Negative for cough, chest tightness and shortness of breath.   Cardiovascular: Positive for chest pain. Negative for palpitations.  Gastrointestinal: Negative for abdominal pain and nausea.  Genitourinary: Negative.   Musculoskeletal: Negative for arthralgias, joint swelling and neck pain.  Skin: Negative.  Negative for rash and wound.  Neurological: Positive for syncope and headaches. Negative for dizziness, weakness, light-headedness and numbness.  Psychiatric/Behavioral: Negative.     Physical Exam Updated Vital Signs BP (!) 168/80   Pulse (!) 43   Temp 98.3 F (36.8 C) (Oral)   Resp 17   Ht 5\' 3"  (1.6 m)   Wt 111.1 kg   SpO2 100%   BMI 43.40 kg/m   Physical Exam Vitals and nursing note reviewed.  Constitutional:      Appearance: She is well-developed.  HENT:     Head: Normocephalic and atraumatic.  Eyes:     Conjunctiva/sclera: Conjunctivae normal.  Cardiovascular:     Rate and Rhythm: Normal rate and regular rhythm.     Heart sounds: Normal heart sounds.     Comments: Hypertensive. Pulmonary:     Effort: Pulmonary effort is normal.     Breath sounds: Normal breath sounds. No wheezing.  Abdominal:     General: Bowel sounds are normal.     Palpations: Abdomen is soft.     Tenderness: There is no abdominal tenderness.  Musculoskeletal:        General: Normal range of motion.     Cervical back: Normal range of motion.     Right lower leg: No edema.     Left lower leg: No edema.  Skin:    General: Skin is warm and dry.  Neurological:     General: No focal  deficit present.     Mental Status: She is alert and oriented to person, place, and time.  Psychiatric:        Mood and Affect: Mood is  anxious.     ED Results / Procedures / Treatments   Labs (all labs ordered are listed, but only abnormal results are displayed) Labs Reviewed  COMPREHENSIVE METABOLIC PANEL - Abnormal; Notable for the following components:      Result Value   Glucose, Bld 185 (*)    BUN 42 (*)    Creatinine, Ser 8.01 (*)    AST 12 (*)    GFR calc non Af Amer 5 (*)    GFR calc Af Amer 6 (*)    All other components within normal limits  CBC WITH DIFFERENTIAL/PLATELET - Abnormal; Notable for the following components:   RBC 3.73 (*)    Hemoglobin 10.6 (*)    HCT 34.6 (*)    All other components within normal limits  CBG MONITORING, ED - Abnormal; Notable for the following components:   Glucose-Capillary 168 (*)    All other components within normal limits  TROPONIN I (HIGH SENSITIVITY) - Abnormal; Notable for the following components:   Troponin I (High Sensitivity) 43 (*)    All other components within normal limits  TROPONIN I (HIGH SENSITIVITY) - Abnormal; Notable for the following components:   Troponin I (High Sensitivity) 43 (*)    All other components within normal limits  CBC WITH DIFFERENTIAL/PLATELET    EKG EKG Interpretation  Date/Time:  Wednesday August 03 2020 08:39:54 EDT Ventricular Rate:  85 PR Interval:    QRS Duration: 88 QT Interval:  361 QTC Calculation: 430 R Axis:   78 Text Interpretation: Sinus rhythm Supraventricular bigeminy Confirmed by Fredia Sorrow [phone]) on 08/03/2020 8:55:46 AM   Radiology CT Head Wo Contrast  Result Date: 08/03/2020 CLINICAL DATA:  Headache with syncope. Renal failure. Hypotensive episode EXAM: CT HEAD WITHOUT CONTRAST TECHNIQUE: Contiguous axial images were obtained from the base of the skull through the vertex without intravenous contrast. COMPARISON:  December 22, 2018 FINDINGS: Brain: Ventricles and sulci are normal in size and configuration. There is no intracranial mass, hemorrhage, extra-axial fluid collection, midline shift. Mild  patchy small vessel disease in the centra semiovale bilaterally is stable. There is no demonstrable acute infarct. Vascular: No hyperdense vessel. There is calcification in each carotid siphon region. Skull: Bony calvarium appears intact. Sinuses/Orbits: There is mucosal thickening in several ethmoid air cells. Other visualized paranasal sinuses are clear. Visualized orbits appear symmetric bilaterally. Other: There is opacification of essentially all mastoid air cells on the left, stable. There is opacification of several inferior mastoid air cells on the right. Most mastoid air cells on the right are clear. IMPRESSION: Stable periventricular small vessel disease. No acute infarct. No mass or hemorrhage. Foci of arterial vascular calcification noted. Mucosal thickening in several ethmoid air cells. Extensive mastoid disease on the left with a lesser degree of mastoid disease on the right inferiorly, unchanged. Electronically Signed   By: Lowella Grip III M.D.   On: 08/03/2020 10:11   DG Chest Portable 1 View  Result Date: 08/03/2020 CLINICAL DATA:  Hypertension and near syncope. EXAM: PORTABLE CHEST 1 VIEW COMPARISON:  January 11, 2020 FINDINGS: Mild, diffuse chronic appearing increased interstitial lung markings are noted. Very mild atelectasis is seen within the lateral aspect of the right lung base. There is no evidence of a pleural effusion or pneumothorax. The cardiac silhouette is moderately enlarged.  Radiopaque surgical clips are seen overlying the lateral aspect of the mid to lower left lung and left hemithorax. The visualized skeletal structures are unremarkable. IMPRESSION: Chronic appearing increased interstitial lung markings without acute or active cardiopulmonary disease. Electronically Signed   By: Virgina Norfolk M.D.   On: 08/03/2020 15:24    Procedures Procedures (including critical care time)  Medications Ordered in ED Medications  acetaminophen (TYLENOL) tablet 1,000 mg (1,000  mg Oral Given 08/03/20 0940)    ED Course  I have reviewed the triage vital signs and the nursing notes.  Pertinent labs & imaging results that were available during my care of the patient were reviewed by me and considered in my medical decision making (see chart for details).    MDM Rules/Calculators/A&P                          Patient with a history of syncope of unclear etiology associated with her dialysis treatment this morning.  Is possible that this is vasovagal, but cannot rule out cardiogenic source.  She has had episodes of chest pain and pressure, none today however.  She has had adjustments in her dialysis treatment plan which has not improved her hypotension with dialysis, including increasing her dry weight. She had several episodes of bradycardia here to 43 bpm.  Discussed with Dr. Waldron [facility] who accepts pt for admission.      Final Clinical Impression(s) / ED Diagnoses Final diagnoses:  Syncope, unspecified syncope type  Hypotension, unspecified hypotension type    Rx / DC Orders ED Discharge Orders    None       Landis Martins 08/03/20 1720    Fredia Sorrow, MD 08/08/20 [phone]

## 2020-08-03 NOTE — ED Triage Notes (Signed)
Pt from [facility]. Pt was not able to complete dialysis. Pt became hypotensive, near syncope, and headache.

## 2020-08-03 NOTE — H&P (Signed)
TRH H&P   Patient Demographics:    Patricia [name], is a 49 y.o. female  MRN: [id number]   DOB - 05-25-1971  Admit Date - 08/03/2020  Outpatient Primary MD for the patient is Jake Samples, PA-C  Referring MD/NP/PA: PA Idol  Outpatient Specialists: [facility],renal  Dr. Theador Hawthorne  Patient coming from: HD center.  Chief Complaint  Patient presents with  . Weakness      HPI:    Patricia [name]  is a 49 y.o. female, with past medical history of diabetes mellitus, end-stage renal disease on dialysis, GERD, hypertension, patient was sent by hemodialysis center for syncope, apparently patient having problem with blood pressure over last 2 weeks, reports blood pressure is uncontrolled, but during hemodialysis sessions it is usually hypotensive, reports this is new issue over the last 2 weeks, for which she has been feeling lightheaded with low blood pressure during hemodialysis, reports her dry weight has been adjusted by nephrologist, report blood pressure usually in the 734K to 876O systolic before dialysis sessions, and goes down to systolic 11X to 72I during dialysis sessions, ports she usually she is having dizziness and lightheadedness, but today she had total loss of consciousness, reports her blood pressure was in the 60s before that episode happened, she did receive fluids back during the session, she was sent to ED for further evaluation, orthostatic was not checked, but blood pressure was acceptable in ED, denies any chest pain during episode today, no slurred speech, he does report episodes of chest pain couple days ago. - in ED patient was noted to have couple episodes of heart rate in the 40s per ED physician, her hemoglobin was stable around baseline, it was 10.6, creatinine was 8.01, potassium 4.4,[facility] hospitalist consulted to admit.    Review of systems:    In addition to the HPI  above,  No Fever-chills, presents with syncope No Headache, No changes with Vision or hearing, No problems swallowing food or Liquids, No Chest pain, Cough or Shortness of Breath, No Abdominal pain, No Nausea or Vommitting, Bowel movements are regular, No Blood in stool or Urine, No dysuria, No new skin rashes or bruises, No new joints pains-aches,  No new weakness, tingling, she reports bilateral hand tingling and numbness, currently resolved No recent weight gain or loss, No polyuria, polydypsia or polyphagia, No significant Mental Stressors.  A full 10 point Review of Systems was done, except as stated above, all other Review of Systems were negative.   With Past History of the following :    Past Medical History:  Diagnosis Date  . Anemia   . Ankle fracture   . Arthritis   . Blood transfusion without reported diagnosis   . Breast cancer ([place])   . Cancer ([place])   . Diabetes mellitus without complication ([place])   . Dialysis patient ([place])    mon, wed, friday,   .  End stage renal disease on dialysis [facility])    M/W/F [facility] in [place]  . GERD (gastroesophageal reflux disease)   . Hypertension   . OSA (obstructive sleep apnea)    uses CPAP sometimes  . Pneumonia   . PONV (postoperative nausea and vomiting)   . Wears glasses       Past Surgical History:  Procedure Laterality Date  . ABDOMINAL HYSTERECTOMY    . AV FISTULA PLACEMENT  11/2014   at [facility] N/A 07/10/2016   Procedure: BALLOON DILATION;  Surgeon: Danie Binder, MD;  Location[facility];  Service: Endoscopy;  Laterality: N/A;  Pyloric dilation  . BREAST LUMPECTOMY    . CESAREAN SECTION    . CHOLECYSTECTOMY    . COLONOSCOPY WITH PROPOFOL N/A 09/27/2016   Dr. Gala Romney: Internal hemorrhoids repeat colonoscopy in 10 years  . DILATION AND CURETTAGE OF UTERUS    . ESOPHAGOGASTRODUODENOSCOPY N/A 07/10/2016   Dr.Fields- normal esophagus, gastric stenosis was found at the pylorus,  gastritis on bx, normal examined duodenun  . EXTERNAL FIXATION REMOVAL Right 10/29/2018   Procedure: REMOVAL RIGHT ANKLE BIOMET ZIMMER EXTERNAL FIXATOR, SHORT LEG CAST APPLICATION;  Surgeon: Marybelle Killings, MD;  Location: [place];  Service: Orthopedics;  Laterality: Right;  . MASTECTOMY     left sided  . ORIF ANKLE FRACTURE Right 10/06/2018   Procedure: OPEN REDUCTION INTERNAL FIXATION (ORIF) RIGHT ANKLE TRIMALLEOLAR;  Surgeon: Marybelle Killings, MD;  Location: [place];  Service: Orthopedics;  Laterality: Right;      Social History:     Social History   Tobacco Use  . Smoking status: Never Smoker  . Smokeless tobacco: Never Used  Substance Use Topics  . Alcohol use: No    Alcohol/week: 0.0 standard drinks       Family History :     Family History  Problem Relation Age of Onset  . Diabetes Mellitus II Mother   . Hypertension Mother   . Hypertension Sister   . Hypertension Sister   . Colon cancer Neg Hx       Home Medications:   Prior to Admission medications   Medication Sig Start Date End Date Taking? Authorizing Provider  albuterol (PROVENTIL HFA;VENTOLIN HFA) 108 (90 Base) MCG/ACT inhaler Inhale 2 puffs into the lungs every 6 (six) hours as needed for wheezing or shortness of breath. 11/28/17  Yes Kathie Dike, MD  albuterol (PROVENTIL) (2.5 MG/3ML) 0.083% nebulizer solution Take 3 mLs (2.5 mg total) by nebulization every 6 (six) hours as needed for wheezing or shortness of breath. 11/28/17  Yes Kathie Dike, MD  bisacodyl (DULCOLAX) 5 MG EC tablet Take 5 mg by mouth daily as needed for moderate constipation.   Yes [provider]  docusate sodium (COLACE) 100 MG capsule Take 100 mg by mouth daily as needed for mild constipation.   Yes [provider]  Ferric Citrate (AURYXIA) 1 GM 210 MG(Fe) TABS Take 2 tablets by mouth 2 (two) times daily with a meal. With Breakfast & with supper   Yes [provider]  FLOVENT HFA 110 MCG/ACT inhaler 1 puff 2  (two) times daily. 12/04/19  Yes [provider]  furosemide (LASIX) 40 MG tablet Take 40-80 mg by mouth 2 (two) times daily. Take 2 tablets (80mg ) in the morning and take 1 tablet (40mg ) at bedtime   Yes [provider]  glipiZIDE (GLUCOTROL) 5 MG tablet Take 5 mg by mouth 2 (two) times daily as  needed. For blood sugar levels over 150   Yes [provider]  ipratropium (ATROVENT) 0.03 % nasal spray Place 2 sprays into both nostrils 2 (two) times daily.  06/06/19  Yes [provider]  linaclotide (LINZESS) 290 MCG CAPS capsule Take 290 mcg by mouth daily before breakfast.   Yes [provider]  multivitamin (RENA-VIT) TABS tablet Take 1 tablet by mouth daily.   Yes [provider]  NIFEdipine (ADALAT CC) 60 MG 24 hr tablet Take 1 tablet (60 mg total) by mouth every evening. 01/02/20  Yes Hongalgi, Lenis Dickinson, MD  ondansetron (ZOFRAN-ODT) 4 MG disintegrating tablet Take 4 mg by mouth every 8 (eight) hours as needed for nausea or vomiting.   Yes [provider]  pantoprazole (PROTONIX) 40 MG tablet Take 40 mg by mouth daily.   Yes [provider]  polyethylene glycol (MIRALAX) 17 g packet Take 17 g by mouth daily as needed for mild constipation. 01/02/20  Yes Hongalgi, Lenis Dickinson, MD  rOPINIRole (REQUIP XL) 2 MG 24 hr tablet Take 2 mg by mouth at bedtime.  09/10/16  Yes [provider]  sevelamer carbonate (RENVELA) 800 MG tablet Take 800 mg by mouth See admin instructions. Take 2 tablets (1600 mg) in the morning, take 3 tablets (2400 mg) with lunch or snack,  and take 2 tablets (1600 mg) by mouth in the evening with dinner meal.   Yes [provider]  sodium chloride (OCEAN) 0.65 % SOLN nasal spray Place 1 spray into both nostrils as needed for congestion. Patient taking differently: Place 1 spray into both nostrils daily as needed for congestion.  01/02/20  Yes Hongalgi, Lenis Dickinson, MD  TRULANCE 3 MG TABS TAKE 1 TABLET BY MOUTH  EVERY DAY Patient not taking: Reported on 08/03/2020 01/27/20   Annitta Needs, NP     Allergies:     Allergies  Allergen Reactions  . Amlodipine Besylate Rash and Other (See Comments)    dizziness  . Reglan [Metoclopramide] Other (See Comments)    hallucinations      Physical Exam:   Vitals  Blood pressure (!) 164/90, pulse (!) 43, temperature 98.3 F (36.8 C), temperature source Oral, resp. rate 18, height 5\' 3"  (1.6 m), weight 111.1 kg, SpO2 98 %.   1. General well developed female, lying in bed in NAD,    2. Normal affect and insight, Not Suicidal or Homicidal, Awake Alert, Oriented X 3.  3. No F.N deficits, ALL C.Nerves Intact, Strength 5/5 all 4 extremities, Sensation intact all 4 extremities, Plantars down going.  4. Ears and Eyes appear Normal, Conjunctivae clear, PERRLA. Moist Oral Mucosa.  5. Supple Neck, No JVD, No cervical lymphadenopathy appriciated, No Carotid Bruits.  6. Symmetrical Chest wall movement, Good air movement bilaterally, CTAB.  7. RRR, No Gallops, Rubs or Murmurs, No Parasternal Heave.  8. Positive Bowel Sounds, Abdomen Soft, No tenderness, No organomegaly appriciated,No rebound -guarding or rigidity.  9.  No Cyanosis, Normal Skin Turgor, No Skin Rash or Bruise.  10. Good muscle tone,  joints appear normal , no effusions, Normal ROM.  11. No Palpable Lymph Nodes in Neck or Axillae     Data Review:    CBC Recent Labs  Lab 08/03/20 0937  WBC 8.4  HGB 10.6*  HCT 34.6*  PLT 236  MCV 92.8  MCH 28.4  MCHC 30.6  RDW 13.3  LYMPHSABS 1.3  MONOABS 0.4  EOSABS 0.2  BASOSABS 0.0   ------------------------------------------------------------------------------------------------------------------  Chemistries  Recent Labs  Lab 08/03/20 0905  NA 139  K 4.4  CL 101  CO2 23  GLUCOSE 185*  BUN 42*  CREATININE 8.01*  CALCIUM 9.8  AST 12*  ALT 14  ALKPHOS 121  BILITOT 0.6    ------------------------------------------------------------------------------------------------------------------ estimated creatinine clearance is 10.1 mL/min (A) (by C-G formula based on SCr of 8.01 mg/dL (H)). ------------------------------------------------------------------------------------------------------------------ No results for input(s): TSH, T4TOTAL, T3FREE, THYROIDAB in the last 72 hours.  Invalid input(s): FREET3  Coagulation profile No results for input(s): INR, PROTIME in the last 168 hours. ------------------------------------------------------------------------------------------------------------------- No results for input(s): DDIMER in the last 72 hours. -------------------------------------------------------------------------------------------------------------------  Cardiac Enzymes No results for input(s): CKMB, TROPONINI, MYOGLOBIN in the last 168 hours.  Invalid input(s): CK ------------------------------------------------------------------------------------------------------------------    Component Value Date/Time   BNP 191.0 (H) 01/11/2020 0011     ---------------------------------------------------------------------------------------------------------------  Urinalysis    Component Value Date/Time   COLORURINE STRAW (A) 12/22/2018 0847   APPEARANCEUR CLEAR 12/22/2018 0847   LABSPEC 1.007 12/22/2018 0847   PHURINE 9.0 (H) 12/22/2018 0847   GLUCOSEU >=500 (A) 12/22/2018 0847   HGBUR NEGATIVE 12/22/2018 0847   [place] 12/22/2018 [place] 12/22/2018 0847   PROTEINUR 100 (A) 12/22/2018 0847   NITRITE NEGATIVE 12/22/2018 0847   LEUKOCYTESUR NEGATIVE 12/22/2018 0847    ----------------------------------------------------------------------------------------------------------------   Imaging Results:    CT Head Wo Contrast  Result Date: 08/03/2020 CLINICAL DATA:  Headache with syncope. Renal failure. Hypotensive  episode EXAM: CT HEAD WITHOUT CONTRAST TECHNIQUE: Contiguous axial images were obtained from the base of the skull through the vertex without intravenous contrast. COMPARISON:  December 22, 2018 FINDINGS: Brain: Ventricles and sulci are normal in size and configuration. There is no intracranial mass, hemorrhage, extra-axial fluid collection, midline shift. Mild patchy small vessel disease in the centra semiovale bilaterally is stable. There is no demonstrable acute infarct. Vascular: No hyperdense vessel. There is calcification in each carotid siphon region. Skull: Bony calvarium appears intact. Sinuses/Orbits: There is mucosal thickening in several ethmoid air cells. Other visualized paranasal sinuses are clear. Visualized orbits appear symmetric bilaterally. Other: There is opacification of essentially all mastoid air cells on the left, stable. There is opacification of several inferior mastoid air cells on the right. Most mastoid air cells on the right are clear. IMPRESSION: Stable periventricular small vessel disease. No acute infarct. No mass or hemorrhage. Foci of arterial vascular calcification noted. Mucosal thickening in several ethmoid air cells. Extensive mastoid disease on the left with a lesser degree of mastoid disease on the right inferiorly, unchanged. Electronically Signed   By: Lowella Grip III M.D.   On: 08/03/2020 10:11   DG Chest Portable 1 View  Result Date: 08/03/2020 CLINICAL DATA:  Hypertension and near syncope. EXAM: PORTABLE CHEST 1 VIEW COMPARISON:  January 11, 2020 FINDINGS: Mild, diffuse chronic appearing increased interstitial lung markings are noted. Very mild atelectasis is seen within the lateral aspect of the right lung base. There is no evidence of a pleural effusion or pneumothorax. The cardiac silhouette is moderately enlarged. Radiopaque surgical clips are seen overlying the lateral aspect of the mid to lower left lung and left hemithorax. The visualized skeletal  structures are unremarkable. IMPRESSION: Chronic appearing increased interstitial lung markings without acute or active cardiopulmonary disease. Electronically Signed   By: Virgina Norfolk M.D.   On: 08/03/2020 15:24    My personal review of EKG: Rhythm NSR, Rate  85 /min, QTc 430, no Acute ST changes   Assessment & Plan:  Active Problems:   End stage renal disease on dialysis ([place])   Type 2 diabetes mellitus (HCC)   Essential hypertension   Anemia   OSA (obstructive sleep apnea)   Syncope   Syncope -This is most likely due to orthostasis during her hemodialysis from fluid shift, apparently she is with very labile blood pressure(systolic in the 383F just before dialysis, dips to the 70s and 80s during hemodialysis). -She will be admitted and monitored on telemetry overnight (ED reports she had couple episodes of bradycardia heart rate in the 40s, unfortunately no telemetry strips were available, I could not find them once reviewed her telemetry in ED ). -We will obtain 2D echo -Pressures currently elevated, will go ahead and resume her on her home regimen. -We will check orthostasis.  ESRD -He is on hemodialysis Monday/Wednesday/Friday, likely will be discharged tomorrow, if she stays here till Friday, then renal need be consulted. -Continue with home medications Renvela  Chest pain -Resolved, EKG nonacute, couple days ago, troponins non-ACS pattern 43> 43.  Hypertension -Appears to be having labile blood pressure, is currently elevated, so continue with nifedipine.  Chronic diastolic see HF -Volume management with dialysis  Anemia of ESRD -Hemoglobin at baseline, continue with home dose iron  Restless leg syndrome -Continue with Requip  Constipation/IBS -Continue with Linzess  OSA -Reports she is using intermittently at home, will order during hospital stay.   DVT Prophylaxis Heparin - SCDs   AM Labs Ordered, also please review Full Orders  Family  Communication: Admission, patients condition and plan of care including tests being ordered have been discussed with the patient and son at bedside who indicate understanding and agree with the plan and Code Status.  Code Status Full  Likely DC to Home  Condition GUARDED    Consults called: None  Admission status: Observation  Time spent in minutes : 60 minutes   Phillips Climes M.D on 08/03/2020 at 5:20 PM   [facility] - Office  [phone]

## 2020-08-03 NOTE — ED Notes (Signed)
ED TO INPATIENT HANDOFF REPORT  ED Nurse Name and Phone #:   S Name/Age/Gender Patricia [name] 49 y.o. female Room/Bed: APA10/APA10  Code Status   Code Status: Prior  Home/SNF/Other Home Patient oriented to: self, place, time and situation Is this baseline? Yes   Triage Complete: Triage complete  Chief Complaint Syncope [R55]  Triage Note Pt from [facility]. Pt was not able to complete dialysis. Pt became hypotensive, near syncope, and headache.     Allergies Allergies  Allergen Reactions  . Amlodipine Besylate Rash and Other (See Comments)    dizziness  . Reglan [Metoclopramide] Other (See Comments)    hallucinations     Level of Care/Admitting Diagnosis ED Disposition    ED Disposition Condition [facility]: [facility] [[id number]]  Level of Care: Telemetry [5]  Covid Evaluation: Asymptomatic Screening Protocol (No Symptoms)  Diagnosis: Syncope [628315]  Admitting Physician: Manfred Shirts  Attending Physician: Waldron Labs, DAWOOD S [[id number]]       B Medical/Surgery History Past Medical History:  Diagnosis Date  . Anemia   . Ankle fracture   . Arthritis   . Blood transfusion without reported diagnosis   . Breast cancer ([place])   . Cancer ([place])   . Diabetes mellitus without complication ([place])   . Dialysis patient ([place])    mon, wed, friday,   . End stage renal disease on dialysis [facility])    M/W/F [facility] in [place]  . GERD (gastroesophageal reflux disease)   . Hypertension   . OSA (obstructive sleep apnea)    uses CPAP sometimes  . Pneumonia   . PONV (postoperative nausea and vomiting)   . Wears glasses    Past Surgical History:  Procedure Laterality Date  . ABDOMINAL HYSTERECTOMY    . AV FISTULA PLACEMENT  11/2014   at [facility] N/A 07/10/2016   Procedure: BALLOON DILATION;  Surgeon: Danie Binder, MD;  [facility];  Service: Endoscopy;  Laterality: N/A;  Pyloric dilation   . BREAST LUMPECTOMY    . CESAREAN SECTION    . CHOLECYSTECTOMY    . COLONOSCOPY WITH PROPOFOL N/A 09/27/2016   Dr. Gala Romney: Internal hemorrhoids repeat colonoscopy in 10 years  . DILATION AND CURETTAGE OF UTERUS    . ESOPHAGOGASTRODUODENOSCOPY N/A 07/10/2016   Dr.Fields- normal esophagus, gastric stenosis was found at the pylorus, gastritis on bx, normal examined duodenun  . EXTERNAL FIXATION REMOVAL Right 10/29/2018   Procedure: REMOVAL RIGHT ANKLE BIOMET ZIMMER EXTERNAL FIXATOR, SHORT LEG CAST APPLICATION;  Surgeon: Marybelle Killings, MD;  Location: [place];  Service: Orthopedics;  Laterality: Right;  . MASTECTOMY     left sided  . ORIF ANKLE FRACTURE Right 10/06/2018   Procedure: OPEN REDUCTION INTERNAL FIXATION (ORIF) RIGHT ANKLE TRIMALLEOLAR;  Surgeon: Marybelle Killings, MD;  Location: [place];  Service: Orthopedics;  Laterality: Right;     A IV Location/Drains/Wounds Patient Lines/Drains/Airways Status    Active Line/Drains/Airways    Name Placement date Placement time Site Days   Peripheral IV 08/03/20 Right Hand 08/03/20  0906  Hand  less than 1   Fistula / Graft Left Upper arm --  --  Upper arm     Fistula / Graft Left Upper arm --  --  Upper arm     Closed System Drain 1 Left Breast Bulb (JP) 09/24/17  2158  Breast  1044   Closed System Drain 2 Left Breast Bulb (JP) 09/24/17  2159  Breast  1044   Incision (Closed) 09/25/17 Breast Left 09/25/17  2300   1043   Incision (Closed) 10/06/18 Ankle Right 10/06/18  1605   667   Incision (Closed) 10/29/18 Leg Right 10/29/18  1322   644   EXTERNAL FIXATOR 10/06/18  1842  --  667          Intake/Output Last 24 hours No intake or output data in the 24 hours ending 08/03/20 1946  Labs/Imaging Results for orders placed or performed during the hospital encounter of 08/03/20 (from the past 48 hour(s))  POC CBG, ED     Status: Abnormal   Collection Time: 08/03/20  9:05 AM  Result Value Ref Range   Glucose-Capillary 168 (H) 70 - 99 mg/dL     Comment: Glucose reference range applies only to samples taken after fasting for at least 8 hours.  Troponin I (High Sensitivity)     Status: Abnormal   Collection Time: 08/03/20  9:05 AM  Result Value Ref Range   Troponin I (High Sensitivity) 43 (H) <18 ng/L    Comment: (NOTE) Elevated high sensitivity troponin I (hsTnI) values and significant  changes across serial measurements may suggest ACS but many other  chronic and acute conditions are known to elevate hsTnI results.  Refer to the "Links" section for chest pain algorithms and additional  guidance. Performed at [facility], [address], [place], [place] [address]   Comprehensive metabolic panel     Status: Abnormal   Collection Time: 08/03/20  9:05 AM  Result Value Ref Range   Sodium 139 135 - 145 mmol/L   Potassium 4.4 3.5 - 5.1 mmol/L   Chloride 101 98 - 111 mmol/L   CO2 23 22 - 32 mmol/L   Glucose, Bld 185 (H) 70 - 99 mg/dL    Comment: Glucose reference range applies only to samples taken after fasting for at least 8 hours.   BUN 42 (H) 6 - 20 mg/dL   Creatinine, Ser 8.01 (H) 0.44 - 1.00 mg/dL   Calcium 9.8 8.9 - 10.3 mg/dL   Total Protein 8.0 6.5 - 8.1 g/dL   Albumin 4.1 3.5 - 5.0 g/dL   AST 12 (L) 15 - 41 U/L   ALT 14 0 - 44 U/L   Alkaline Phosphatase 121 38 - 126 U/L   Total Bilirubin 0.6 0.3 - 1.2 mg/dL   GFR calc non Af Amer 5 (L) >60 mL/min   GFR calc Af Amer 6 (L) >60 mL/min   Anion gap 15 5 - 15    Comment: Performed at [facility], [address] [place], [place] [address]  CBC with Differential/Platelet     Status: Abnormal   Collection Time: 08/03/20  9:37 AM  Result Value Ref Range   WBC 8.4 4.0 - 10.5 K/uL   RBC 3.73 (L) 3.87 - 5.11 MIL/uL   Hemoglobin 10.6 (L) 12.0 - 15.0 g/dL   HCT 34.6 (L) 36 - 46 %   MCV 92.8 80.0 - 100.0 fL   MCH 28.4 26.0 - 34.0 pg   MCHC 30.6 30.0 - 36.0 g/dL   RDW 13.3 11.5 - 15.5 %   Platelets 236 150 - 400 K/uL   nRBC 0.0 0.0 - 0.2 %   Neutrophils Relative % 77 %    Neutro Abs 6.4 1.7 - 7.7 K/uL   Lymphocytes Relative 16 %   Lymphs Abs 1.3 0.7 - 4.0 K/uL   Monocytes Relative 5 %  Monocytes Absolute 0.4 0 - 1 K/uL   Eosinophils Relative 2 %   Eosinophils Absolute 0.2 0 - 0 K/uL   Basophils Relative 0 %   Basophils Absolute 0.0 0 - 0 K/uL   Immature Granulocytes 0 %   Abs Immature Granulocytes 0.03 0.00 - 0.07 K/uL    Comment: Performed at [facility], [address] [place], [place] [address]  Troponin I (High Sensitivity)     Status: Abnormal   Collection Time: 08/03/20 11:05 AM  Result Value Ref Range   Troponin I (High Sensitivity) 43 (H) <18 ng/L    Comment: (NOTE) Elevated high sensitivity troponin I (hsTnI) values and significant  changes across serial measurements may suggest ACS but many other  chronic and acute conditions are known to elevate hsTnI results.  Refer to the "Links" section for chest pain algorithms and additional  guidance. Performed at [facility], [address] [place], [place] [address]   SARS Coronavirus 2 by RT PCR (hospital order, performed in [facility] hospital lab) Nasopharyngeal Nasopharyngeal Swab     Status: None   Collection Time: 08/03/20  4:54 PM   Specimen: Nasopharyngeal Swab  Result Value Ref Range   SARS Coronavirus 2 NEGATIVE NEGATIVE    Comment: (NOTE) SARS-CoV-2 target nucleic acids are NOT DETECTED.  The SARS-CoV-2 RNA is generally detectable in upper and lower respiratory specimens during the acute phase of infection. The lowest concentration of SARS-CoV-2 viral copies this assay can detect is 250 copies / mL. A negative result does not preclude SARS-CoV-2 infection and should not be used as the sole basis for treatment or other patient management decisions.  A negative result may occur with improper specimen collection / handling, submission of specimen other than nasopharyngeal swab, presence of viral mutation(s) within the areas targeted by this assay, and inadequate number of viral  copies (<250 copies / mL). A negative result must be combined with clinical observations, patient history, and epidemiological information.  Fact Sheet for Patients:   StrictlyIdeas.no  Fact Sheet for Healthcare Providers: BankingDealers.co.za  This test is not yet approved or  cleared by the [place] FDA and has been authorized for detection and/or diagnosis of SARS-CoV-2 by FDA under an Emergency Use Authorization (EUA).  This EUA will remain in effect (meaning this test can be used) for the duration of the COVID-19 declaration under Section 564(b)(1) of the Act, 21 U.S.C. section 360bbb-3(b)(1), unless the authorization is terminated or revoked sooner.  Performed at [facility], [address], [place], [place] [address]    CT Head Wo Contrast  Result Date: 08/03/2020 CLINICAL DATA:  Headache with syncope. Renal failure. Hypotensive episode EXAM: CT HEAD WITHOUT CONTRAST TECHNIQUE: Contiguous axial images were obtained from the base of the skull through the vertex without intravenous contrast. COMPARISON:  December 22, 2018 FINDINGS: Brain: Ventricles and sulci are normal in size and configuration. There is no intracranial mass, hemorrhage, extra-axial fluid collection, midline shift. Mild patchy small vessel disease in the centra semiovale bilaterally is stable. There is no demonstrable acute infarct. Vascular: No hyperdense vessel. There is calcification in each carotid siphon region. Skull: Bony calvarium appears intact. Sinuses/Orbits: There is mucosal thickening in several ethmoid air cells. Other visualized paranasal sinuses are clear. Visualized orbits appear symmetric bilaterally. Other: There is opacification of essentially all mastoid air cells on the left, stable. There is opacification of several inferior mastoid air cells on the right. Most mastoid air cells on the right are clear. IMPRESSION: Stable  periventricular small vessel  disease. No acute infarct. No mass or hemorrhage. Foci of arterial vascular calcification noted. Mucosal thickening in several ethmoid air cells. Extensive mastoid disease on the left with a lesser degree of mastoid disease on the right inferiorly, unchanged. Electronically Signed   By: Lowella Grip III M.D.   On: 08/03/2020 10:11   DG Chest Portable 1 View  Result Date: 08/03/2020 CLINICAL DATA:  Hypertension and near syncope. EXAM: PORTABLE CHEST 1 VIEW COMPARISON:  January 11, 2020 FINDINGS: Mild, diffuse chronic appearing increased interstitial lung markings are noted. Very mild atelectasis is seen within the lateral aspect of the right lung base. There is no evidence of a pleural effusion or pneumothorax. The cardiac silhouette is moderately enlarged. Radiopaque surgical clips are seen overlying the lateral aspect of the mid to lower left lung and left hemithorax. The visualized skeletal structures are unremarkable. IMPRESSION: Chronic appearing increased interstitial lung markings without acute or active cardiopulmonary disease. Electronically Signed   By: Virgina Norfolk M.D.   On: 08/03/2020 15:24    Pending Labs Unresulted Labs (From admission, onward) Comment          Start     Ordered   08/03/20 0918  CBC WITH DIFFERENTIAL  ONCE - STAT,   STAT        08/03/20 0920   Signed and Held  Basic metabolic panel  Tomorrow morning,   R        Signed and Held   Signed and Held  CBC  Tomorrow morning,   R        Signed and Held          Vitals/Pain Today's Vitals   08/03/20 1758 08/03/20 1803 08/03/20 1900 08/03/20 1930  BP: (!) 174/85  (!) 176/77 (!) 168/76  Pulse: 88   90  Resp:  14    Temp:      TempSrc:      SpO2: 99%   95%  Weight:      Height:      PainSc:        Isolation Precautions No active isolations  Medications Medications  acetaminophen (TYLENOL) tablet 1,000 mg (1,000 mg Oral Given 08/03/20 0940)    Mobility walks Moderate fall risk   Focused  Assessments    R Recommendations: See Admitting Provider Note  Report given to:   Additional Notes:

## 2020-08-04 ENCOUNTER — Observation Stay (HOSPITAL_BASED_OUTPATIENT_CLINIC_OR_DEPARTMENT_OTHER): Payer: Medicare Other

## 2020-08-04 DIAGNOSIS — N186 End stage renal disease: Secondary | ICD-10-CM | POA: Diagnosis not present

## 2020-08-04 DIAGNOSIS — E1122 Type 2 diabetes mellitus with diabetic chronic kidney disease: Secondary | ICD-10-CM | POA: Diagnosis not present

## 2020-08-04 DIAGNOSIS — I10 Essential (primary) hypertension: Secondary | ICD-10-CM | POA: Diagnosis not present

## 2020-08-04 DIAGNOSIS — Z794 Long term (current) use of insulin: Secondary | ICD-10-CM | POA: Diagnosis not present

## 2020-08-04 DIAGNOSIS — R55 Syncope and collapse: Secondary | ICD-10-CM | POA: Diagnosis not present

## 2020-08-04 DIAGNOSIS — D631 Anemia in chronic kidney disease: Secondary | ICD-10-CM | POA: Diagnosis not present

## 2020-08-04 DIAGNOSIS — Z992 Dependence on renal dialysis: Secondary | ICD-10-CM | POA: Diagnosis not present

## 2020-08-04 LAB — BASIC METABOLIC PANEL
Anion gap: 18 — ABNORMAL HIGH (ref 5–15)
BUN: 62 mg/dL — ABNORMAL HIGH (ref 6–20)
CO2: 23 mmol/L (ref 22–32)
Calcium: 10.2 mg/dL (ref 8.9–10.3)
Chloride: 100 mmol/L (ref 98–111)
Creatinine, Ser: 11.26 mg/dL — ABNORMAL HIGH (ref 0.44–1.00)
GFR calc Af Amer: 4 mL/min — ABNORMAL LOW (ref >60)
GFR calc non Af Amer: 4 mL/min — ABNORMAL LOW (ref >60)
Glucose, Bld: 244 mg/dL — ABNORMAL HIGH (ref 70–99)
Potassium: 5.7 mmol/L — ABNORMAL HIGH (ref 3.5–5.1)
Sodium: 141 mmol/L (ref 135–145)

## 2020-08-04 LAB — CBC
HCT: 35.7 % — ABNORMAL LOW (ref 36.0–46.0)
Hemoglobin: 10.8 g/dL — ABNORMAL LOW (ref 12.0–15.0)
MCH: 28.6 pg (ref 26.0–34.0)
MCHC: 30.3 g/dL (ref 30.0–36.0)
MCV: 94.7 fL (ref 80.0–100.0)
Platelets: 266 10*3/uL (ref 150–400)
RBC: 3.77 MIL/uL — ABNORMAL LOW (ref 3.87–5.11)
RDW: 13.3 % (ref 11.5–15.5)
WBC: 9 10*3/uL (ref 4.0–10.5)
nRBC: 0 % (ref 0.0–0.2)

## 2020-08-04 LAB — GLUCOSE, CAPILLARY
Glucose-Capillary: 214 mg/dL — ABNORMAL HIGH (ref 70–99)
Glucose-Capillary: 304 mg/dL — ABNORMAL HIGH (ref 70–99)

## 2020-08-04 LAB — ECHOCARDIOGRAM COMPLETE
Area-P 1/2: 3.15 cm2
Height: 63 in
S' Lateral: 2.79 cm
Weight: 4102.32 oz

## 2020-08-04 LAB — HEMOGLOBIN A1C
Hgb A1c MFr Bld: 8.4 % — ABNORMAL HIGH (ref 4.8–5.6)
Mean Plasma Glucose: 194.38 mg/dL

## 2020-08-04 MED ORDER — INSULIN ASPART 100 UNIT/ML ~~LOC~~ SOLN
0.0000 [IU] | Freq: Three times a day (TID) | SUBCUTANEOUS | Status: DC
  Administered 2020-08-04: 4 [IU] via SUBCUTANEOUS

## 2020-08-04 MED ORDER — SODIUM ZIRCONIUM CYCLOSILICATE 10 G PO PACK
10.0000 g | PACK | Freq: Every day | ORAL | Status: DC
  Administered 2020-08-04: 10 g via ORAL
  Filled 2020-08-04: qty 1

## 2020-08-04 MED ORDER — INSULIN ASPART 100 UNIT/ML ~~LOC~~ SOLN: 0.0000 [IU] | Freq: Every day | SUBCUTANEOUS | Status: DC

## 2020-08-04 NOTE — Progress Notes (Signed)
*  PRELIMINARY RESULTS* Echocardiogram 2D Echocardiogram has been performed.  Patricia [name] 08/04/2020, 11:15 AM

## 2020-08-04 NOTE — Discharge Summary (Signed)
Physician Discharge Summary  Patricia [name] MBT:[id number] DOB: 03-04-1971 DOA: 08/03/2020  PCP: Jake Samples, PA-C  Admit date: 08/03/2020 Discharge date: 08/04/2020  Time spent: 35 minutes  Recommendations for Outpatient Follow-up:  1. Reassess blood pressure and further adjust antihypertensive regimen as needed 2. Continue to closely follow CBGs and A1c with further adjustment to hypoglycemic regimen as required.   Discharge Diagnoses:  Active Problems:   End stage renal disease on dialysis ([facility])   Type 2 diabetes mellitus (HCC)   Essential hypertension   Anemia   OSA (obstructive sleep apnea)   Syncope Chronic diastolic heart failure Morbid obesity Gastroesophageal flux disease Restless leg syndrome   Discharge Condition: Stable and improved. Discharged home with instruction to follow-up with PCP and nephrology as an outpatient. Next dialysis treatment on 08/05/2020  CODE STATUS: Full code  Diet recommendation: Low-sodium, modified carbohydrates and low calorie diet,  Filed Weights   08/03/20 0847 08/04/20 0500  Weight: 111.1 kg 116.3 kg    History of present illness:  As per H&P written by Dr. Waldron Labs on 08/03/2020 49 y.o. female, with past medical history of diabetes mellitus, end-stage renal disease on dialysis, GERD, hypertension, patient was sent by hemodialysis center for syncope, apparently patient having problem with blood pressure over last 2 weeks, reports blood pressure is uncontrolled, but during hemodialysis sessions it is usually hypotensive, reports this is new issue over the last 2 weeks, for which she has been feeling lightheaded with low blood pressure during hemodialysis, reports her dry weight has been adjusted by nephrologist, report blood pressure usually in the 536I to 680H systolic before dialysis sessions, and goes down to systolic 21Y to 24M during dialysis sessions, ports she usually she is having dizziness and lightheadedness, but today she  had total loss of consciousness, reports her blood pressure was in the 60s before that episode happened, she did receive fluids back during the session, she was sent to ED for further evaluation, orthostatic was not checked, but blood pressure was acceptable in ED, denies any chest pain during episode today, no slurred speech, he does report episodes of chest pain couple days ago. - in ED patient was noted to have couple episodes of heart rate in the 40s per ED physician, her hemoglobin was stable around baseline, it was 10.6, creatinine was 8.01, potassium 4.4,[facility] hospitalist consulted to admit.  Hospital Course:  1-syncope -Appears to be orthostatic hypotension in the setting of hemodialysis treatment -Negative CT head without contrast -No normalities on chest x-ray -2D echo with preserved ejection fraction, moderate hypertrophy, no wall motion normalities or significant valvular disorder. -No signs of acute infection. -Patient vital signs remained stable during hospitalization and blood pressure in fact creeping up. -Diuretics has been discontinued discharge -After discussing with nephrology service recommendations given for further adjustment into patient's dry weight/profile and the use of midodrine if needed to prevent future episodes of hypotension while achieving net goal.  2-end-stage renal disease -Resume hemodialysis on Monday, Wednesdays and Fridays. -Next dialysis treatment tomorrow -No signs of fluid overload on exam -Very mild hyperkalemia treated with [facility] after discussing with nephrology service in-house. -Patient instructed not to miss dialysis on 08/05/2020  3-chronic diastolic heart failure -Continue volume management with dialysis -Compensated currently -Instructed to check weight on daily basis, to maintain adequate hydration and to follow low-sodium diet.  4-Anemia of ESRD -Hemoglobin is stable at baseline -No overt bleeding appreciated -Continue IV iron and  Epogen therapy as per nephrology service discretion.  5-restless leg syndrome -  Continue the use of Requip  6-chronic constipation -Continue the use of Linzess  7-type 2 diabetes mellitus with chronic renal failure on hemodialysis -Resume the use of glipizide -Continue modified carbohydrate diet -Follow CBGs/A1c as an outpatient with further adjustment to hypoglycemic regimen as required.  8-hypertension -Continue the use of nifedipine extended release 60 mg daily -Advised to follow heart healthy/low-sodium diet. -Diuretics has been recommended in order to further assist minimizing the chances for hypotensive episodes during dialysis. Patient also has expressed very little urine output at this time.  9-secondary hyperparathyroidism -Continue the use of Renvela  10-gastroesophageal flux disease -Continue the use of PPIs  11-obesity -Body mass index is 45.42 kg/m. -Low calorie diet, portion control increase physical activity discussed with patient   Procedures:  See below for x-ray report  2D echo: 1. Left ventricular ejection fraction, by estimation, is 60 to 65%. The  left ventricle has normal function. The left ventricle has no regional  wall motion abnormalities. There is moderate left ventricular hypertrophy.  Left ventricular diastolic  parameters are indeterminate.  2. Right ventricular systolic function is normal. The right ventricular  size is normal. Tricuspid regurgitation signal is inadequate for assessing  PA pressure.  3. The mitral valve is grossly normal. Trivial mitral valve  regurgitation.  4. The aortic valve is tricuspid. Aortic valve regurgitation is not  visualized.  5. The inferior vena cava is normal in size with greater than 50%  respiratory variability, suggesting right atrial pressure of 3 mmHg.    Consultations:  Nephrology service curbside: Dr. Moshe Cipro; recommendations given for [facility] x1 resume outpatient hemodialysis on  08/05/2020. No current indication for acute hemodialysis treatment while inpatient.  Discharge Exam: Vitals:   08/03/20 2259 08/04/20 0535  BP:  (!) 158/67  Pulse: 84 88  Resp: 18 16  Temp:  98.4 F (36.9 C)  SpO2:  99%    General: No chest pain, no nausea, no vomiting, no palpitations. Patient reports no further episode of syncope and feels ready to go home. Cardiovascular: S1 and S2, no rubs, no gallops, unable to progressive JVD with body habitus. Respiratory: Good air movement bilaterally, positive scattered rhonchi, no frank crackles, no wheezing, no using accessory muscles. Abdomen: Obese, soft, nontender, nondistended, positive bowel sounds Extremities: No cyanosis or clubbing.  Discharge Instructions   Discharge Instructions    Diet - low sodium heart healthy   Complete by: As directed    Discharge instructions   Complete by: As directed    Take medications as prescribed Follow-up with PCP in 10 days Follow low-sodium, modify carbohydrates and low calorie diet Maintain adequate hydration     Allergies as of 08/04/2020      Reactions   Amlodipine Besylate Rash, Other (See Comments)   dizziness   Reglan [metoclopramide] Other (See Comments)   hallucinations      Medication List    STOP taking these medications   furosemide 40 MG tablet Commonly known as: LASIX   Trulance 3 MG Tabs Generic drug: Plecanatide     TAKE these medications   albuterol (2.5 MG/3ML) 0.083% nebulizer solution Commonly known as: PROVENTIL Take 3 mLs (2.5 mg total) by nebulization every 6 (six) hours as needed for wheezing or shortness of breath.   albuterol 108 (90 Base) MCG/ACT inhaler Commonly known as: VENTOLIN HFA Inhale 2 puffs into the lungs every 6 (six) hours as needed for wheezing or shortness of breath.   Auryxia 1 GM 210 MG(Fe) tablet Generic drug: ferric citrate  Take 2 tablets by mouth 2 (two) times daily with a meal. With Breakfast & with supper   bisacodyl 5 MG  EC tablet Commonly known as: DULCOLAX Take 5 mg by mouth daily as needed for moderate constipation.   docusate sodium 100 MG capsule Commonly known as: COLACE Take 100 mg by mouth daily as needed for mild constipation.   Flovent HFA 110 MCG/ACT inhaler Generic drug: fluticasone 1 puff 2 (two) times daily.   glipiZIDE 5 MG tablet Commonly known as: GLUCOTROL Take 5 mg by mouth 2 (two) times daily as needed. For blood sugar levels over 150   ipratropium 0.03 % nasal spray Commonly known as: ATROVENT Place 2 sprays into both nostrils 2 (two) times daily.   Linzess 290 MCG Caps capsule Generic drug: linaclotide Take 290 mcg by mouth daily before breakfast.   multivitamin Tabs tablet Take 1 tablet by mouth daily.   NIFEdipine 60 MG 24 hr tablet Commonly known as: ADALAT CC Take 1 tablet (60 mg total) by mouth every evening.   ondansetron 4 MG disintegrating tablet Commonly known as: ZOFRAN-ODT Take 4 mg by mouth every 8 (eight) hours as needed for nausea or vomiting.   pantoprazole 40 MG tablet Commonly known as: PROTONIX Take 40 mg by mouth daily.   polyethylene glycol 17 g packet Commonly known as: MiraLax Take 17 g by mouth daily as needed for mild constipation.   Renvela 800 MG tablet Generic drug: sevelamer carbonate Take 800 mg by mouth See admin instructions. Take 2 tablets (1600 mg) in the morning, take 3 tablets (2400 mg) with lunch or snack,  and take 2 tablets (1600 mg) by mouth in the evening with dinner meal.   rOPINIRole 2 MG 24 hr tablet Commonly known as: REQUIP XL Take 2 mg by mouth at bedtime.   sodium chloride 0.65 % Soln nasal spray Commonly known as: OCEAN Place 1 spray into both nostrils as needed for congestion. What changed: when to take this      Allergies  Allergen Reactions  . Amlodipine Besylate Rash and Other (See Comments)    dizziness  . Reglan [Metoclopramide] Other (See Comments)    hallucinations     Follow-up Information     Jake Samples, PA-C. Schedule an appointment as soon as possible for a visit in 10 day(s).   Specialty: Family Medicine Contact information: [address] [place] [address] [phone]               The results of significant diagnostics from this hospitalization (including imaging, microbiology, ancillary and laboratory) are listed below for reference.    Significant Diagnostic Studies: CT Head Wo Contrast  Result Date: 08/03/2020 CLINICAL DATA:  Headache with syncope. Renal failure. Hypotensive episode EXAM: CT HEAD WITHOUT CONTRAST TECHNIQUE: Contiguous axial images were obtained from the base of the skull through the vertex without intravenous contrast. COMPARISON:  December 22, 2018 FINDINGS: Brain: Ventricles and sulci are normal in size and configuration. There is no intracranial mass, hemorrhage, extra-axial fluid collection, midline shift. Mild patchy small vessel disease in the centra semiovale bilaterally is stable. There is no demonstrable acute infarct. Vascular: No hyperdense vessel. There is calcification in each carotid siphon region. Skull: Bony calvarium appears intact. Sinuses/Orbits: There is mucosal thickening in several ethmoid air cells. Other visualized paranasal sinuses are clear. Visualized orbits appear symmetric bilaterally. Other: There is opacification of essentially all mastoid air cells on the left, stable. There is opacification of several inferior mastoid air  cells on the right. Most mastoid air cells on the right are clear. IMPRESSION: Stable periventricular small vessel disease. No acute infarct. No mass or hemorrhage. Foci of arterial vascular calcification noted. Mucosal thickening in several ethmoid air cells. Extensive mastoid disease on the left with a lesser degree of mastoid disease on the right inferiorly, unchanged. Electronically Signed   By: Lowella Grip III M.D.   On: 08/03/2020 10:11   DG Chest Portable 1 View  Result  Date: 08/03/2020 CLINICAL DATA:  Hypertension and near syncope. EXAM: PORTABLE CHEST 1 VIEW COMPARISON:  January 11, 2020 FINDINGS: Mild, diffuse chronic appearing increased interstitial lung markings are noted. Very mild atelectasis is seen within the lateral aspect of the right lung base. There is no evidence of a pleural effusion or pneumothorax. The cardiac silhouette is moderately enlarged. Radiopaque surgical clips are seen overlying the lateral aspect of the mid to lower left lung and left hemithorax. The visualized skeletal structures are unremarkable. IMPRESSION: Chronic appearing increased interstitial lung markings without acute or active cardiopulmonary disease. Electronically Signed   By: Virgina Norfolk M.D.   On: 08/03/2020 15:24   ECHOCARDIOGRAM COMPLETE  Result Date: 08/04/2020    ECHOCARDIOGRAM REPORT   Patient Name:   [facility] Picinich Date of Exam: 08/04/2020 Medical Rec #:  [id number]   Height:       63.0 in Accession #:    [id number]  Weight:       256.4 lb Date of Birth:  [birth year]  BSA:          2.149 m Patient Age:    40 years    BP:           158/67 mmHg Patient Gender: F           HR:           88 bpm. Exam Location:  [facility] Procedure: 2D Echo Indications:    Syncope 780.2 / R55  History:        Patient has prior history of Echocardiogram examinations, most                 recent 11/27/2017. Signs/Symptoms:Chest Pain and Syncope; Risk                 Factors:Hypertension, Diabetes and Non-Smoker. ESRD, GERD,                 Elevated Troponin, Acute Respiratory Failure.  Sonographer:    Leavy Cella RDCS (AE) Referring Phys: [id number] IMPRESSIONS  1. Left ventricular ejection fraction, by estimation, is 60 to 65%. The left ventricle has normal function. The left ventricle has no regional wall motion abnormalities. There is moderate left ventricular hypertrophy. Left ventricular diastolic parameters are indeterminate.  2. Right ventricular systolic function is normal.  The right ventricular size is normal. Tricuspid regurgitation signal is inadequate for assessing PA pressure.  3. The mitral valve is grossly normal. Trivial mitral valve regurgitation.  4. The aortic valve is tricuspid. Aortic valve regurgitation is not visualized.  5. The inferior vena cava is normal in size with greater than 50% respiratory variability, suggesting right atrial pressure of 3 mmHg. FINDINGS  Left Ventricle: Left ventricular ejection fraction, by estimation, is 60 to 65%. The left ventricle has normal function. The left ventricle has no regional wall motion abnormalities. The left ventricular internal cavity size was normal in size. There is  moderate left ventricular hypertrophy. Left ventricular diastolic parameters are indeterminate. Right Ventricle: The right  ventricular size is normal. No increase in right ventricular wall thickness. Right ventricular systolic function is normal. Tricuspid regurgitation signal is inadequate for assessing PA pressure. Left Atrium: Left atrial size was normal in size. Right Atrium: Right atrial size was normal in size. Pericardium: There is no evidence of pericardial effusion. Presence of pericardial fat pad. Mitral Valve: The mitral valve is grossly normal. Mild mitral annular calcification. Trivial mitral valve regurgitation. Tricuspid Valve: The tricuspid valve is grossly normal. Tricuspid valve regurgitation is trivial. Aortic Valve: The aortic valve is tricuspid. Aortic valve regurgitation is not visualized. Mild aortic valve annular calcification. Pulmonic Valve: The pulmonic valve was grossly normal. Pulmonic valve regurgitation is trivial. Aorta: The aortic root is normal in size and structure. Venous: The inferior vena cava is normal in size with greater than 50% respiratory variability, suggesting right atrial pressure of 3 mmHg. IAS/Shunts: No atrial level shunt detected by color flow Doppler.  LEFT VENTRICLE PLAX 2D LVIDd:         3.55 cm  Diastology  LVIDs:         2.79 cm  LV e' lateral:   5.44 cm/s LV PW:         1.95 cm  LV E/e' lateral: 17.2 LV IVS:        1.34 cm  LV e' medial:    7.72 cm/s LVOT diam:     2.00 cm  LV E/e' medial:  12.1 LVOT Area:     3.14 cm  RIGHT VENTRICLE RV S prime:     19.00 cm/s TAPSE (M-mode): 2.8 cm LEFT ATRIUM             Index       RIGHT ATRIUM           Index LA diam:        4.10 cm 1.91 cm/m  RA Area:     15.90 cm LA Vol (A2C):   25.3 ml 11.77 ml/m RA Volume:   44.80 ml  20.85 ml/m LA Vol (A4C):   41.5 ml 19.31 ml/m LA Biplane Vol: 35.5 ml 16.52 ml/m   AORTA Ao Root diam: 2.40 cm MITRAL VALVE MV Area (PHT): 3.15 cm    SHUNTS MV Decel Time: 241 msec    Systemic Diam: 2.00 cm MV E velocity: 93.40 cm/s MV A velocity: 99.00 cm/s MV E/A ratio:  0.94 Rozann Lesches MD Electronically signed by Rozann Lesches MD Signature Date/Time: 08/04/2020/11:19:33 AM    Final    XR Ankle Complete Right  Result Date: 07/12/2020 Three-view x-rays right ankle AP lateral mortise view obtained and reviewed.  This shows unchanged findings of previous open reduction internal fixation trimalleolar ankle fracture with interval healing.  Fibular plate anatomic with 8 distal screws where she had previous fibular comminution.  Ankle joint is reduced no AVN of the talus. Impression: Satisfactory right ankle trimalleolar fixation.   Microbiology: Recent Results (from the past 240 hour(s))  SARS Coronavirus 2 by RT PCR (hospital order, performed in [facility] hospital lab) Nasopharyngeal Nasopharyngeal Swab     Status: None   Collection Time: 08/03/20  4:54 PM   Specimen: Nasopharyngeal Swab  Result Value Ref Range Status   SARS Coronavirus 2 NEGATIVE NEGATIVE Final    Comment: (NOTE) SARS-CoV-2 target nucleic acids are NOT DETECTED.  The SARS-CoV-2 RNA is generally detectable in upper and lower respiratory specimens during the acute phase of infection. The lowest concentration of SARS-CoV-2 viral copies this assay can detect is  250  copies / mL. A negative result does not preclude SARS-CoV-2 infection and should not be used as the sole basis for treatment or other patient management decisions.  A negative result may occur with improper specimen collection / handling, submission of specimen other than nasopharyngeal swab, presence of viral mutation(s) within the areas targeted by this assay, and inadequate number of viral copies (<250 copies / mL). A negative result must be combined with clinical observations, patient history, and epidemiological information.  Fact Sheet for Patients:   StrictlyIdeas.no  Fact Sheet for Healthcare Providers: BankingDealers.co.za  This test is not yet approved or  cleared by the [place] FDA and has been authorized for detection and/or diagnosis of SARS-CoV-2 by FDA under an Emergency Use Authorization (EUA).  This EUA will remain in effect (meaning this test can be used) for the duration of the COVID-19 declaration under Section 564(b)(1) of the Act, 21 U.S.C. section 360bbb-3(b)(1), unless the authorization is terminated or revoked sooner.  Performed at [facility], [address], [place], [place] [address]      Labs: Basic Metabolic Panel: Recent Labs  Lab 08/03/20 0905 08/04/20 0452  NA 139 141  K 4.4 5.7*  CL 101 100  CO2 23 23  GLUCOSE 185* 244*  BUN 42* 62*  CREATININE 8.01* 11.26*  CALCIUM 9.8 10.2   Liver Function Tests: Recent Labs  Lab 08/03/20 0905  AST 12*  ALT 14  ALKPHOS 121  BILITOT 0.6  PROT 8.0  ALBUMIN 4.1   CBC: Recent Labs  Lab 08/03/20 0937 08/04/20 0452  WBC 8.4 9.0  NEUTROABS 6.4  --   HGB 10.6* 10.8*  HCT 34.6* 35.7*  MCV 92.8 94.7  PLT 236 266    BNP (last 3 results) Recent Labs    01/11/20 0011  BNP 191.0*    CBG: Recent Labs  Lab 08/03/20 0905 08/04/20 0906 08/04/20 1155  GLUCAP 168* 214* 304*    Signed:  Barton Dubois MD.  [facility] 08/04/2020, 2:35 PM

## 2020-08-04 NOTE — Progress Notes (Signed)
NURSING PROGRESS NOTE  Anderson Coppock [id number] Discharge Data: 08/04/2020 3:59 PM Attending Provider: Barton Dubois, MD WUJ:[id number], Hulen Shouts, PA-C     Rexine Stickle to be D/C'd Home per MD order.  Discussed with the patient the After Visit Summary and all questions fully answered. All IV's discontinued with no bleeding noted. All belongings returned to patient for patient to take home.   Last Vital Signs:  Blood pressure (!) 158/67, pulse 88, temperature 98.4 F (36.9 C), temperature source Oral, resp. rate 16, height 5\' 3"  (1.6 m), weight 116.3 kg, SpO2 99 %.  Discharge Medication List Allergies as of 08/04/2020      Reactions   Amlodipine Besylate Rash, Other (See Comments)   dizziness   Reglan [metoclopramide] Other (See Comments)   hallucinations      Medication List    STOP taking these medications   furosemide 40 MG tablet Commonly known as: LASIX   Trulance 3 MG Tabs Generic drug: Plecanatide     TAKE these medications   albuterol (2.5 MG/3ML) 0.083% nebulizer solution Commonly known as: PROVENTIL Take 3 mLs (2.5 mg total) by nebulization every 6 (six) hours as needed for wheezing or shortness of breath.   albuterol 108 (90 Base) MCG/ACT inhaler Commonly known as: VENTOLIN HFA Inhale 2 puffs into the lungs every 6 (six) hours as needed for wheezing or shortness of breath.   Auryxia 1 GM 210 MG(Fe) tablet Generic drug: ferric citrate Take 2 tablets by mouth 2 (two) times daily with a meal. With Breakfast & with supper Notes to patient: Next dose due tonight with dinner   bisacodyl 5 MG EC tablet Commonly known as: DULCOLAX Take 5 mg by mouth daily as needed for moderate constipation.   docusate sodium 100 MG capsule Commonly known as: COLACE Take 100 mg by mouth daily as needed for mild constipation.   Flovent HFA 110 MCG/ACT inhaler Generic drug: fluticasone 1 puff 2 (two) times daily.   glipiZIDE 5 MG tablet Commonly known as: GLUCOTROL Take 5 mg by  mouth 2 (two) times daily as needed. For blood sugar levels over 150   ipratropium 0.03 % nasal spray Commonly known as: ATROVENT Place 2 sprays into both nostrils 2 (two) times daily.   Linzess 290 MCG Caps capsule Generic drug: linaclotide Take 290 mcg by mouth daily before breakfast. Notes to patient: Next dose due tomorrow   multivitamin Tabs tablet Take 1 tablet by mouth daily. Notes to patient: Next dose due tomorrow   NIFEdipine 60 MG 24 hr tablet Commonly known as: ADALAT CC Take 1 tablet (60 mg total) by mouth every evening. Notes to patient: Next dose due this evening   ondansetron 4 MG disintegrating tablet Commonly known as: ZOFRAN-ODT Take 4 mg by mouth every 8 (eight) hours as needed for nausea or vomiting.   pantoprazole 40 MG tablet Commonly known as: PROTONIX Take 40 mg by mouth daily. Notes to patient: Next dose due tomorrow   polyethylene glycol 17 g packet Commonly known as: MiraLax Take 17 g by mouth daily as needed for mild constipation.   Renvela 800 MG tablet Generic drug: sevelamer carbonate Take 800 mg by mouth See admin instructions. Take 2 tablets (1600 mg) in the morning, take 3 tablets (2400 mg) with lunch or snack,  and take 2 tablets (1600 mg) by mouth in the evening with dinner meal. Notes to patient: Next dose due tonight with dinner   rOPINIRole 2 MG 24 hr tablet Commonly known as: REQUIP  XL Take 2 mg by mouth at bedtime. Notes to patient: Next dose due tomorrow   sodium chloride 0.65 % Soln nasal spray Commonly known as: OCEAN Place 1 spray into both nostrils as needed for congestion. What changed: when to take this        Doristine Devoid, RN

## 2020-08-05 DIAGNOSIS — D509 Iron deficiency anemia, unspecified: Secondary | ICD-10-CM | POA: Diagnosis not present

## 2020-08-05 DIAGNOSIS — Z992 Dependence on renal dialysis: Secondary | ICD-10-CM | POA: Diagnosis not present

## 2020-08-05 DIAGNOSIS — R110 Nausea: Secondary | ICD-10-CM | POA: Diagnosis not present

## 2020-08-05 DIAGNOSIS — N2581 Secondary hyperparathyroidism of renal origin: Secondary | ICD-10-CM | POA: Diagnosis not present

## 2020-08-05 DIAGNOSIS — R1110 Vomiting, unspecified: Secondary | ICD-10-CM | POA: Diagnosis not present

## 2020-08-05 DIAGNOSIS — N186 End stage renal disease: Secondary | ICD-10-CM | POA: Diagnosis not present

## 2020-08-08 DIAGNOSIS — N186 End stage renal disease: Secondary | ICD-10-CM | POA: Diagnosis not present

## 2020-08-08 DIAGNOSIS — Z992 Dependence on renal dialysis: Secondary | ICD-10-CM | POA: Diagnosis not present

## 2020-08-10 DIAGNOSIS — Z992 Dependence on renal dialysis: Secondary | ICD-10-CM | POA: Diagnosis not present

## 2020-08-10 DIAGNOSIS — N186 End stage renal disease: Secondary | ICD-10-CM | POA: Diagnosis not present

## 2020-08-12 DIAGNOSIS — N186 End stage renal disease: Secondary | ICD-10-CM | POA: Diagnosis not present

## 2020-08-12 DIAGNOSIS — Z992 Dependence on renal dialysis: Secondary | ICD-10-CM | POA: Diagnosis not present

## 2020-08-15 DIAGNOSIS — N186 End stage renal disease: Secondary | ICD-10-CM | POA: Diagnosis not present

## 2020-08-15 DIAGNOSIS — Z992 Dependence on renal dialysis: Secondary | ICD-10-CM | POA: Diagnosis not present

## 2020-08-15 DIAGNOSIS — N2581 Secondary hyperparathyroidism of renal origin: Secondary | ICD-10-CM | POA: Diagnosis not present

## 2020-08-15 DIAGNOSIS — D509 Iron deficiency anemia, unspecified: Secondary | ICD-10-CM | POA: Diagnosis not present

## 2020-08-15 DIAGNOSIS — R1110 Vomiting, unspecified: Secondary | ICD-10-CM | POA: Diagnosis not present

## 2020-08-15 DIAGNOSIS — R110 Nausea: Secondary | ICD-10-CM | POA: Diagnosis not present

## 2020-08-16 ENCOUNTER — Encounter: Payer: Self-pay | Admitting: Physical Therapy

## 2020-08-16 ENCOUNTER — Ambulatory Visit (INDEPENDENT_AMBULATORY_CARE_PROVIDER_SITE_OTHER): Payer: Medicare Other | Admitting: Physical Therapy

## 2020-08-16 ENCOUNTER — Other Ambulatory Visit: Payer: Self-pay

## 2020-08-16 DIAGNOSIS — E785 Hyperlipidemia, unspecified: Secondary | ICD-10-CM | POA: Diagnosis not present

## 2020-08-16 DIAGNOSIS — M25571 Pain in right ankle and joints of right foot: Secondary | ICD-10-CM

## 2020-08-16 DIAGNOSIS — M25671 Stiffness of right ankle, not elsewhere classified: Secondary | ICD-10-CM

## 2020-08-16 DIAGNOSIS — N186 End stage renal disease: Secondary | ICD-10-CM | POA: Diagnosis not present

## 2020-08-16 DIAGNOSIS — N185 Chronic kidney disease, stage 5: Secondary | ICD-10-CM | POA: Diagnosis not present

## 2020-08-16 DIAGNOSIS — R2681 Unsteadiness on feet: Secondary | ICD-10-CM

## 2020-08-16 DIAGNOSIS — I120 Hypertensive chronic kidney disease with stage 5 chronic kidney disease or end stage renal disease: Secondary | ICD-10-CM | POA: Diagnosis not present

## 2020-08-16 DIAGNOSIS — R2689 Other abnormalities of gait and mobility: Secondary | ICD-10-CM

## 2020-08-16 DIAGNOSIS — E1122 Type 2 diabetes mellitus with diabetic chronic kidney disease: Secondary | ICD-10-CM | POA: Diagnosis not present

## 2020-08-16 DIAGNOSIS — Z992 Dependence on renal dialysis: Secondary | ICD-10-CM | POA: Diagnosis not present

## 2020-08-16 NOTE — Therapy (Signed)
[place], [place], [address] Phone: [phone]   Fax:  [phone]  Physical Therapy Treatment  Patient Details  Name: Patricia [name] MRN: [id number] Date of Birth: 19-Feb-1971 Referring Provider (PT): Marybelle Killings, MD   Encounter Date: 08/16/2020   PT End of Session - 08/16/20 1009    Visit Number 2    Number of Visits 12    Date for PT Re-Evaluation 09/08/20    Authorization Type Medicare/Aetna    PT Start Time 0930    PT Stop Time 1010    PT Time Calculation (min) 40 min    Activity Tolerance Patient tolerated treatment well    Behavior During Therapy [facility] for tasks assessed/performed           Past Medical History:  Diagnosis Date  . Anemia   . Ankle fracture   . Arthritis   . Blood transfusion without reported diagnosis   . Breast cancer ([place])   . Cancer ([place])   . Diabetes mellitus without complication ([place])   . Dialysis patient ([place])    mon, wed, friday,   . End stage renal disease on dialysis [facility])    M/W/F [facility] in [place]  . GERD (gastroesophageal reflux disease)   . Hypertension   . OSA (obstructive sleep apnea)    uses CPAP sometimes  . Pneumonia   . PONV (postoperative nausea and vomiting)   . Wears glasses     Past Surgical History:  Procedure Laterality Date  . ABDOMINAL HYSTERECTOMY    . AV FISTULA PLACEMENT  11/2014   at [place] N/A 07/10/2016   Procedure: BALLOON DILATION;  Surgeon: Danie Binder, MD;  [facility];  Service: Endoscopy;  Laterality: N/A;  Pyloric dilation  . BREAST LUMPECTOMY    . CESAREAN SECTION    . CHOLECYSTECTOMY    . COLONOSCOPY WITH PROPOFOL N/A 09/27/2016   Dr. Gala Romney: Internal hemorrhoids repeat colonoscopy in 10 years  . DILATION AND CURETTAGE OF UTERUS    . ESOPHAGOGASTRODUODENOSCOPY N/A 07/10/2016   Dr.Fields- normal esophagus, gastric stenosis was found at the pylorus, gastritis on bx, normal examined duodenun   . EXTERNAL FIXATION REMOVAL Right 10/29/2018   Procedure: REMOVAL RIGHT ANKLE BIOMET ZIMMER EXTERNAL FIXATOR, SHORT LEG CAST APPLICATION;  Surgeon: Marybelle Killings, MD;  Location: [place];  Service: Orthopedics;  Laterality: Right;  . MASTECTOMY     left sided  . ORIF ANKLE FRACTURE Right 10/06/2018   Procedure: OPEN REDUCTION INTERNAL FIXATION (ORIF) RIGHT ANKLE TRIMALLEOLAR;  Surgeon: Marybelle Killings, MD;  Location: [place];  Service: Orthopedics;  Laterality: Right;    There were no vitals filed for this visit.   Subjective Assessment - 08/16/20 0928    Subjective left theraband in [place] when visiting family; was a sister's house and she had a lot of stairs so pain has increased.  also got admitted for one night due to syncopal episode while at dialysis.    Limitations Walking   steps   Patient Stated Goals improve pain    Pain Score 0-No pain    Pain Onset More than a month ago                             [facility] Adult PT Treatment/Exercise - 08/16/20 0935      Ankle Exercises: Aerobic   Nustep L3 x 8 min; LEs only  Ankle Exercises: Seated   Other Seated Ankle Exercises L2 ankle 4 way RLE 20 reps each direction      Ankle Exercises: Standing   SLS RLE only 5x10 sec with intermittent UE support    Heel Raises Both;20 reps    Other Standing Ankle Exercises step ups forward and lateral 2x10 onto 6" step      Ankle Exercises: Stretches   Slant Board Stretch 5 reps;30 seconds                    PT Short Term Goals - 08/16/20 1010      PT SHORT TERM GOAL #1   Title report compliance with HEP    Baseline 8/31: left band at sisters so hasn't been able due to this    Status Partially Met    Target Date 08/11/20             PT [place] Term Goals - 08/16/20 1010      PT [place] TERM GOAL #1   Title independent with HEP    Status On-going    Target Date 09/08/20      PT [place] TERM GOAL #2   Title improve Rt ankle DF AROM to at least 4 deg past  neutral for improved function    Status On-going    Target Date 09/08/20      PT [place] TERM GOAL #3   Title report pain < 6/10 with ascending/descending stairs or curb for improved function    Status On-going    Target Date 09/08/20      PT Plate TERM GOAL #4   Title improve Rt ankle strength to at least 4/5 for improved mobility    Status On-going    Target Date 09/08/20      PT [place] TERM GOAL #5   Title demonstrate RLE SLS to at least 8 sec for improved balance and strength    Status On-going    Target Date 09/08/20                 Plan - 08/16/20 1010    Clinical Impression Statement Pt reports compliance with HEP, except after leaving theraband at sister's house in [place].  Overall tolerated session well, and fatigues quickly needing standing rest breaks.  STG #1 partially met, LTGs ongoing at this time.    Personal Factors and Comorbidities Comorbidity 3+    Comorbidities breast cancer, DM, ESRD on dialysis M/W/F, HTN, OSA    Examination-Activity Limitations Locomotion Level;Stairs;Stand    Examination-Participation Restrictions Community Activity    Stability/Clinical Decision Making Evolving/Moderate complexity    Rehab Potential Good    PT Frequency 1x / week   recommend 1-2x/wk; likely 1x/wk due to limited availability from dialysis   PT Duration 6 weeks    PT Treatment/Interventions ADLs/Self Care Home Management;Cryotherapy;Electrical Stimulation;Ultrasound;Moist Heat;Iontophoresis 67m/ml Dexamethasone;Gait training;Stair training;Functional mobility training;Therapeutic activities;Therapeutic exercise;Balance training;Patient/family education;Neuromuscular re-education;Manual techniques;Vasopneumatic Device;Taping;Dry needling;Passive range of motion    PT Next Visit Plan progress weight bearing exercises, dorsiflexion ROM    PT Home Exercise Plan Access Code: [email or web address]    Consulted and Agree with Plan of Care Patient           Patient will benefit from  skilled therapeutic intervention in order to improve the following deficits and impairments:  Abnormal gait, Pain, Decreased mobility, Decreased range of motion, Decreased strength, Impaired flexibility, Increased edema, Difficulty walking, Decreased balance, Decreased activity tolerance  Visit Diagnosis: Pain in right ankle and joints  of right foot  Stiffness of right ankle, not elsewhere classified  Other abnormalities of gait and mobility  Unsteadiness on feet     Problem List Patient Active Problem List   Diagnosis Date Noted  . Syncope 08/03/2020  . Acute pulmonary edema ([place]) 01/11/2020  . OSA (obstructive sleep apnea)   . Closed right ankle fracture 10/06/2018  . Closed displaced trimalleolar fracture of right ankle 10/06/2018  . Acute colitis 02/26/2018  . Hypoxemia 02/24/2018  . Dyspnea 11/26/2017  . Anemia 11/26/2017  . Elevated troponin 11/26/2017  . Hyperkalemia 09/29/2017  . Acute respiratory failure ([place]) 09/28/2017  . CAP (community acquired pneumonia) 09/26/2017  . Volume overload 09/25/2017  . Acute on chronic diastolic CHF (congestive heart failure) ([place]) 09/25/2017  . Lactic acidosis 09/25/2017  . Hypokalemia 09/25/2017  . Breast cancer ([place]) 07/22/2017  . SOB (shortness of breath)   . Acute respiratory failure with hypoxia ([place]) 07/21/2017  . Flatulence 10/25/2016  . GERD (gastroesophageal reflux disease) 08/02/2016  . Pyloric stenosis, acquired   . Nausea with vomiting   . Generalized abdominal pain   . Chest pain, rule out acute myocardial infarction 07/04/2016  . Constipation 07/04/2016  . Nausea 07/04/2016  . Essential hypertension 07/04/2016  . HCAP (healthcare-associated pneumonia) 12/04/2015  . End stage renal disease on dialysis ([place]) 12/04/2015  . Type 2 diabetes mellitus ([place]) 12/04/2015      Laureen Abrahams, PT, DPT 08/16/20 10:12 AM    [facility] Physical Therapy [address] [place], [place],  [address] Phone: [phone]   Fax:  [phone]  Name: Soul Deveney MRN: [id number] Date of Birth: 02-08-1971

## 2020-08-17 DIAGNOSIS — D631 Anemia in chronic kidney disease: Secondary | ICD-10-CM | POA: Diagnosis not present

## 2020-08-17 DIAGNOSIS — D509 Iron deficiency anemia, unspecified: Secondary | ICD-10-CM | POA: Diagnosis not present

## 2020-08-17 DIAGNOSIS — R110 Nausea: Secondary | ICD-10-CM | POA: Diagnosis not present

## 2020-08-17 DIAGNOSIS — R1110 Vomiting, unspecified: Secondary | ICD-10-CM | POA: Diagnosis not present

## 2020-08-17 DIAGNOSIS — N186 End stage renal disease: Secondary | ICD-10-CM | POA: Diagnosis not present

## 2020-08-17 DIAGNOSIS — N2581 Secondary hyperparathyroidism of renal origin: Secondary | ICD-10-CM | POA: Diagnosis not present

## 2020-08-17 DIAGNOSIS — Z992 Dependence on renal dialysis: Secondary | ICD-10-CM | POA: Diagnosis not present

## 2020-08-17 NOTE — Progress Notes (Signed)
Referring Provider: Jake Samples, PA* Primary Care Physician:  Jake Samples, PA-C Primary GI: Dr. Abbey Chatters  Chief Complaint  Patient presents with  . Follow-up    constipation    HPI:   Patricia [name] is a 49 y.o. female presenting today with a history of constipation, abdominal pain, here for routine follow-up. Has tried Trulance, Amitiza, Linzess.   Linzess samples at last visit with attempt at patient assistance. [place] working the best. Instead of taking it on an empty stomach, she is taking it with food, which works the best. We are still working on patient assistance forms.   Miralax doesn't work well for patient. Has to take Colace, dulcolax, Miralax. On dialysis. On fluid restriction. Prune juice with apple sauce and oats. Still no relief.  Doing well on Protonix daily.    Past Medical History:  Diagnosis Date  . Anemia   . Ankle fracture   . Arthritis   . Blood transfusion without reported diagnosis   . Breast cancer ([place])   . Cancer ([place])   . Diabetes mellitus without complication ([place])   . Dialysis patient ([place])    mon, wed, friday,   . End stage renal disease on dialysis [facility])    M/W/F [facility] in [place]  . GERD (gastroesophageal reflux disease)   . Hypertension   . OSA (obstructive sleep apnea)    uses CPAP sometimes  . Pneumonia   . PONV (postoperative nausea and vomiting)   . Wears glasses     Past Surgical History:  Procedure Laterality Date  . ABDOMINAL HYSTERECTOMY    . AV FISTULA PLACEMENT  11/2014   at [place] N/A 07/10/2016   Procedure: BALLOON DILATION;  Surgeon: Danie Binder, MD;  [facility];  Service: Endoscopy;  Laterality: N/A;  Pyloric dilation  . BREAST LUMPECTOMY    . CESAREAN SECTION    . CHOLECYSTECTOMY    . COLONOSCOPY WITH PROPOFOL N/A 09/27/2016   Dr. Gala Romney: Internal hemorrhoids repeat colonoscopy in 10 years  . DILATION AND CURETTAGE OF UTERUS    .  ESOPHAGOGASTRODUODENOSCOPY N/A 07/10/2016   Dr.Fields- normal esophagus, gastric stenosis was found at the pylorus, gastritis on bx, normal examined duodenun  . EXTERNAL FIXATION REMOVAL Right 10/29/2018   Procedure: REMOVAL RIGHT ANKLE BIOMET ZIMMER EXTERNAL FIXATOR, SHORT LEG CAST APPLICATION;  Surgeon: Marybelle Killings, MD;  Location: [place];  Service: Orthopedics;  Laterality: Right;  . MASTECTOMY     left sided  . ORIF ANKLE FRACTURE Right 10/06/2018   Procedure: OPEN REDUCTION INTERNAL FIXATION (ORIF) RIGHT ANKLE TRIMALLEOLAR;  Surgeon: Marybelle Killings, MD;  Location: [place];  Service: Orthopedics;  Laterality: Right;    Current Outpatient Medications  Medication Sig Dispense Refill  . albuterol (PROVENTIL HFA;VENTOLIN HFA) 108 (90 Base) MCG/ACT inhaler Inhale 2 puffs into the lungs every 6 (six) hours as needed for wheezing or shortness of breath. 1 Inhaler 2  . albuterol (PROVENTIL) (2.5 MG/3ML) 0.083% nebulizer solution Take 3 mLs (2.5 mg total) by nebulization every 6 (six) hours as needed for wheezing or shortness of breath. 75 mL 12  . bisacodyl (DULCOLAX) 5 MG EC tablet Take 5 mg by mouth daily.     Marland Kitchen docusate sodium (COLACE) 100 MG capsule Take 100 mg by mouth daily.     . Ferric Citrate (AURYXIA) 1 GM 210 MG(Fe) TABS Take 2 tablets by mouth 2 (two) times daily with a meal. With  Breakfast & with supper    . FLOVENT HFA 110 MCG/ACT inhaler 1 puff 2 (two) times daily.    Marland Kitchen glipiZIDE (GLUCOTROL) 5 MG tablet Take 5 mg by mouth 2 (two) times daily as needed. For blood sugar levels over 150    . ipratropium (ATROVENT) 0.03 % nasal spray Place 2 sprays into both nostrils 2 (two) times daily.     . multivitamin (RENA-VIT) TABS tablet Take 1 tablet by mouth daily.    Marland Kitchen NIFEdipine (ADALAT CC) 60 MG 24 hr tablet Take 1 tablet (60 mg total) by mouth every evening.    . ondansetron (ZOFRAN-ODT) 4 MG disintegrating tablet Take 4 mg by mouth as needed for nausea or vomiting.     . pantoprazole  (PROTONIX) 40 MG tablet Take 40 mg by mouth daily.    . polyethylene glycol (MIRALAX) 17 g packet Take 17 g by mouth daily as needed for mild constipation. (Patient taking differently: Take 17 g by mouth daily. )    . rOPINIRole (REQUIP XL) 2 MG 24 hr tablet Take 2 mg by mouth at bedtime.     . sevelamer carbonate (RENVELA) 800 MG tablet Take 800 mg by mouth See admin instructions. Take 2 tablets (1600 mg) in the morning, take 3 tablets (2400 mg) with lunch or snack,  and take 2 tablets (1600 mg) by mouth in the evening with dinner meal.    . sodium chloride (OCEAN) 0.65 % SOLN nasal spray Place 1 spray into both nostrils as needed for congestion. (Patient taking differently: Place 1 spray into both nostrils daily as needed for congestion. ) 60 mL 0  . linaclotide (LINZESS) 290 MCG CAPS capsule Take 290 mcg by mouth daily before breakfast. (Patient not taking: Reported on 08/18/2020)     No current facility-administered medications for this visit.    Allergies as of 08/18/2020 - Review Complete 08/18/2020  Allergen Reaction Noted  . Amlodipine besylate Rash and Other (See Comments) 12/04/2015  . Reglan [metoclopramide] Other (See Comments) 12/04/2015    Family History  Problem Relation Age of Onset  . Diabetes Mellitus II Mother   . Hypertension Mother   . Hypertension Sister   . Hypertension Sister   . Colon cancer Neg Hx     Social History   Socioeconomic History  . Marital status: Widowed    Spouse name: Not on file  . Number of children: Not on file  . Years of education: Not on file  . Highest education level: Not on file  Occupational History  . Not on file  Tobacco Use  . Smoking status: Never Smoker  . Smokeless tobacco: Never Used  Vaping Use  . Vaping Use: Never used  Substance and Sexual Activity  . Alcohol use: No    Alcohol/week: 0.0 standard drinks  . Drug use: No  . Sexual activity: Not on file  Other Topics Concern  . Not on file  Social History Narrative   . Not on file   Social Determinants of Health   Financial Resource Strain:   . Difficulty of Paying Living Expenses: Not on file  Food Insecurity:   . Worried About Charity fundraiser in the Last Year: Not on file  . Ran Out of Food in the Last Year: Not on file  Transportation Needs:   . Lack of Transportation (Medical): Not on file  . Lack of Transportation (Non-Medical): Not on file  Physical Activity:   . Days of Exercise per Week:  Not on file  . Minutes of Exercise per Session: Not on file  Stress:   . Feeling of Stress : Not on file  Social Connections:   . Frequency of Communication with Friends and Family: Not on file  . Frequency of Social Gatherings with Friends and Family: Not on file  . Attends Religious Services: Not on file  . Active Member of Clubs or Organizations: Not on file  . Attends Archivist Meetings: Not on file  . Marital Status: Not on file    Review of Systems: Gen: Denies fever, chills, anorexia. Denies fatigue, weakness, weight loss.  CV: Denies chest pain, palpitations, syncope, peripheral edema, and claudication. Resp: Denies dyspnea at rest, cough, wheezing, coughing up blood, and pleurisy. GI: see HPI Derm: Denies rash, itching, dry skin Psych: Denies depression, anxiety, memory loss, confusion. No homicidal or suicidal ideation.  Heme: Denies bruising, bleeding, and enlarged lymph nodes.  Physical Exam: BP (!) 163/81   Pulse 96   Temp 97.8 F (36.6 C) (Oral)   Ht 5\' 3"  (1.6 m)   Wt 254 lb 9.6 oz (115.5 kg)   BMI 45.10 kg/m  General:   Alert and oriented. No distress noted. Pleasant and cooperative.  Head:  Normocephalic and atraumatic. Eyes:  Conjuctiva clear without scleral icterus. Mouth:  Mask in place Abdomen:  +BS, soft, non-tender and non-distended. No rebound or guarding. No HSM or masses noted. Msk:  Symmetrical without gross deformities. Normal posture. Extremities:  Without edema. Neurologic:  Alert and   oriented x4 Psych:  Alert and cooperative. Normal mood and affect.  ASSESSMENT: Patricia [name] is a 49 y.o. female presenting today with history of constipation, GERD, for routine follow-up.  Difficult managing constipation historically due to insurance coverage. Linzess has worked best for patient, so we will continue providing samples and work on patient assistance.  GERD well-managed with Protonix once daily. Refills provided.    PLAN:  Continue novel dosing of Linzess 290 mcg WITH food daily, as this works best for her  Protonix once daily  Return in 4-6 months  Will work on patient assistance forms  Annitta Needs, PhD, [facility]

## 2020-08-18 ENCOUNTER — Encounter: Payer: Self-pay | Admitting: Gastroenterology

## 2020-08-18 ENCOUNTER — Other Ambulatory Visit: Payer: Self-pay

## 2020-08-18 ENCOUNTER — Ambulatory Visit (INDEPENDENT_AMBULATORY_CARE_PROVIDER_SITE_OTHER): Payer: Medicare Other | Admitting: Gastroenterology

## 2020-08-18 VITALS — BP 163/81 | HR 96 | Temp 97.8°F | Ht 63.0 in | Wt 254.6 lb

## 2020-08-18 DIAGNOSIS — K5900 Constipation, unspecified: Secondary | ICD-10-CM

## 2020-08-18 DIAGNOSIS — K219 Gastro-esophageal reflux disease without esophagitis: Secondary | ICD-10-CM

## 2020-08-18 MED ORDER — PANTOPRAZOLE SODIUM 40 MG PO TBEC
40.0000 mg | DELAYED_RELEASE_TABLET | Freq: Every day | ORAL | 3 refills | Status: DC
Start: 2020-08-18 — End: 2021-02-16

## 2020-08-18 NOTE — Progress Notes (Signed)
Cc'ed to pcp °

## 2020-08-18 NOTE — Patient Instructions (Signed)
Continue Protonix once daily, 30 minutes before breakfast.  You can keep taking Linzess WITH food if this works best for you. We will keep providing samples as needed. We will need to work on the patient assistance forms.  Return in 4-6 months!  I enjoyed seeing you again today! As you know, I value our relationship and want to provide genuine, compassionate, and quality care. I welcome your feedback. If you receive a survey regarding your visit,  I greatly appreciate you taking time to fill this out. See you next time!  Annitta Needs, PhD, ANP-BC [facility]

## 2020-08-19 DIAGNOSIS — D509 Iron deficiency anemia, unspecified: Secondary | ICD-10-CM | POA: Diagnosis not present

## 2020-08-19 DIAGNOSIS — N186 End stage renal disease: Secondary | ICD-10-CM | POA: Diagnosis not present

## 2020-08-19 DIAGNOSIS — R110 Nausea: Secondary | ICD-10-CM | POA: Diagnosis not present

## 2020-08-19 DIAGNOSIS — R1110 Vomiting, unspecified: Secondary | ICD-10-CM | POA: Diagnosis not present

## 2020-08-19 DIAGNOSIS — N2581 Secondary hyperparathyroidism of renal origin: Secondary | ICD-10-CM | POA: Diagnosis not present

## 2020-08-19 DIAGNOSIS — Z992 Dependence on renal dialysis: Secondary | ICD-10-CM | POA: Diagnosis not present

## 2020-08-22 DIAGNOSIS — D509 Iron deficiency anemia, unspecified: Secondary | ICD-10-CM | POA: Diagnosis not present

## 2020-08-22 DIAGNOSIS — R1110 Vomiting, unspecified: Secondary | ICD-10-CM | POA: Diagnosis not present

## 2020-08-22 DIAGNOSIS — N186 End stage renal disease: Secondary | ICD-10-CM | POA: Diagnosis not present

## 2020-08-22 DIAGNOSIS — R110 Nausea: Secondary | ICD-10-CM | POA: Diagnosis not present

## 2020-08-22 DIAGNOSIS — Z992 Dependence on renal dialysis: Secondary | ICD-10-CM | POA: Diagnosis not present

## 2020-08-22 DIAGNOSIS — N2581 Secondary hyperparathyroidism of renal origin: Secondary | ICD-10-CM | POA: Diagnosis not present

## 2020-08-23 ENCOUNTER — Encounter: Payer: Medicare Other | Admitting: Physical Therapy

## 2020-08-24 DIAGNOSIS — R1110 Vomiting, unspecified: Secondary | ICD-10-CM | POA: Diagnosis not present

## 2020-08-24 DIAGNOSIS — R110 Nausea: Secondary | ICD-10-CM | POA: Diagnosis not present

## 2020-08-24 DIAGNOSIS — D509 Iron deficiency anemia, unspecified: Secondary | ICD-10-CM | POA: Diagnosis not present

## 2020-08-24 DIAGNOSIS — N186 End stage renal disease: Secondary | ICD-10-CM | POA: Diagnosis not present

## 2020-08-24 DIAGNOSIS — N2581 Secondary hyperparathyroidism of renal origin: Secondary | ICD-10-CM | POA: Diagnosis not present

## 2020-08-24 DIAGNOSIS — Z992 Dependence on renal dialysis: Secondary | ICD-10-CM | POA: Diagnosis not present

## 2020-08-26 DIAGNOSIS — N2581 Secondary hyperparathyroidism of renal origin: Secondary | ICD-10-CM | POA: Diagnosis not present

## 2020-08-26 DIAGNOSIS — N186 End stage renal disease: Secondary | ICD-10-CM | POA: Diagnosis not present

## 2020-08-26 DIAGNOSIS — R1110 Vomiting, unspecified: Secondary | ICD-10-CM | POA: Diagnosis not present

## 2020-08-26 DIAGNOSIS — Z992 Dependence on renal dialysis: Secondary | ICD-10-CM | POA: Diagnosis not present

## 2020-08-26 DIAGNOSIS — D509 Iron deficiency anemia, unspecified: Secondary | ICD-10-CM | POA: Diagnosis not present

## 2020-08-26 DIAGNOSIS — R110 Nausea: Secondary | ICD-10-CM | POA: Diagnosis not present

## 2020-08-29 DIAGNOSIS — N2581 Secondary hyperparathyroidism of renal origin: Secondary | ICD-10-CM | POA: Diagnosis not present

## 2020-08-29 DIAGNOSIS — Z992 Dependence on renal dialysis: Secondary | ICD-10-CM | POA: Diagnosis not present

## 2020-08-29 DIAGNOSIS — D509 Iron deficiency anemia, unspecified: Secondary | ICD-10-CM | POA: Diagnosis not present

## 2020-08-29 DIAGNOSIS — R110 Nausea: Secondary | ICD-10-CM | POA: Diagnosis not present

## 2020-08-29 DIAGNOSIS — R1110 Vomiting, unspecified: Secondary | ICD-10-CM | POA: Diagnosis not present

## 2020-08-29 DIAGNOSIS — N186 End stage renal disease: Secondary | ICD-10-CM | POA: Diagnosis not present

## 2020-08-30 ENCOUNTER — Encounter: Payer: Medicare Other | Admitting: Physical Therapy

## 2020-08-30 DIAGNOSIS — R1110 Vomiting, unspecified: Secondary | ICD-10-CM | POA: Diagnosis not present

## 2020-08-30 DIAGNOSIS — Z992 Dependence on renal dialysis: Secondary | ICD-10-CM | POA: Diagnosis not present

## 2020-08-30 DIAGNOSIS — R110 Nausea: Secondary | ICD-10-CM | POA: Diagnosis not present

## 2020-08-30 DIAGNOSIS — N186 End stage renal disease: Secondary | ICD-10-CM | POA: Diagnosis not present

## 2020-08-30 DIAGNOSIS — D509 Iron deficiency anemia, unspecified: Secondary | ICD-10-CM | POA: Diagnosis not present

## 2020-08-30 DIAGNOSIS — N2581 Secondary hyperparathyroidism of renal origin: Secondary | ICD-10-CM | POA: Diagnosis not present

## 2020-09-02 DIAGNOSIS — D509 Iron deficiency anemia, unspecified: Secondary | ICD-10-CM | POA: Diagnosis not present

## 2020-09-02 DIAGNOSIS — R1110 Vomiting, unspecified: Secondary | ICD-10-CM | POA: Diagnosis not present

## 2020-09-02 DIAGNOSIS — Z992 Dependence on renal dialysis: Secondary | ICD-10-CM | POA: Diagnosis not present

## 2020-09-02 DIAGNOSIS — N186 End stage renal disease: Secondary | ICD-10-CM | POA: Diagnosis not present

## 2020-09-02 DIAGNOSIS — N2581 Secondary hyperparathyroidism of renal origin: Secondary | ICD-10-CM | POA: Diagnosis not present

## 2020-09-02 DIAGNOSIS — R110 Nausea: Secondary | ICD-10-CM | POA: Diagnosis not present

## 2020-09-05 DIAGNOSIS — D509 Iron deficiency anemia, unspecified: Secondary | ICD-10-CM | POA: Diagnosis not present

## 2020-09-05 DIAGNOSIS — Z992 Dependence on renal dialysis: Secondary | ICD-10-CM | POA: Diagnosis not present

## 2020-09-05 DIAGNOSIS — R110 Nausea: Secondary | ICD-10-CM | POA: Diagnosis not present

## 2020-09-05 DIAGNOSIS — N186 End stage renal disease: Secondary | ICD-10-CM | POA: Diagnosis not present

## 2020-09-05 DIAGNOSIS — R1110 Vomiting, unspecified: Secondary | ICD-10-CM | POA: Diagnosis not present

## 2020-09-05 DIAGNOSIS — N2581 Secondary hyperparathyroidism of renal origin: Secondary | ICD-10-CM | POA: Diagnosis not present

## 2020-09-06 ENCOUNTER — Encounter: Payer: Self-pay | Admitting: Physical Therapy

## 2020-09-06 ENCOUNTER — Other Ambulatory Visit: Payer: Self-pay

## 2020-09-06 ENCOUNTER — Ambulatory Visit (INDEPENDENT_AMBULATORY_CARE_PROVIDER_SITE_OTHER): Payer: Medicare Other | Admitting: Physical Therapy

## 2020-09-06 DIAGNOSIS — M25671 Stiffness of right ankle, not elsewhere classified: Secondary | ICD-10-CM

## 2020-09-06 DIAGNOSIS — R2689 Other abnormalities of gait and mobility: Secondary | ICD-10-CM | POA: Diagnosis not present

## 2020-09-06 DIAGNOSIS — M25571 Pain in right ankle and joints of right foot: Secondary | ICD-10-CM | POA: Diagnosis not present

## 2020-09-06 DIAGNOSIS — R2681 Unsteadiness on feet: Secondary | ICD-10-CM

## 2020-09-06 NOTE — Patient Instructions (Signed)
Access Code: [email or web address] URL: [email or web address] Date: 09/06/2020 Prepared by: Faustino Congress  Exercises Seated Ankle Eversion with Resistance - 2 x daily - 7 x weekly - 20 reps - 1 sets Seated Ankle Plantar Flexion with Resistance Loop - 2 x daily - 7 x weekly - 1 sets - 20 reps Seated Ankle Dorsiflexion with Resistance - 2 x daily - 7 x weekly - 1 sets - 20 reps Seated Figure 4 Ankle Inversion with Resistance - 2 x daily - 7 x weekly - 1 sets - 20 reps Standing Single Leg Stance with Unilateral Counter Support - 2 x daily - 7 x weekly - 3 reps - 1 sets - 10 sec hold Standing Gastroc Stretch at Counter - 2 x daily - 7 x weekly - 1 sets - 3 reps - 30 sec hold Standing Soleus Stretch at Counter - 2 x daily - 7 x weekly - 1 sets - 3 reps - 30 sec hold

## 2020-09-06 NOTE — Therapy (Signed)
[place], [place], [address] Phone: [phone]   Fax:  [phone]  Physical Therapy Treatment/Discharge Summary  Patient Details  Name: Patricia [name] MRN: [id number] Date of Birth: 1971-04-30 Referring Provider (PT): Marybelle Killings, MD   Encounter Date: 09/06/2020   PT End of Session - 09/06/20 0920    Visit Number 3    Number of Visits 12    Date for PT Re-Evaluation 09/08/20    Authorization Type Medicare/Aetna    PT Start Time 0845   d/c visit   PT Stop Time 0919    PT Time Calculation (min) 34 min    Activity Tolerance Patient tolerated treatment well    Behavior During Therapy [facility] for tasks assessed/performed           Past Medical History:  Diagnosis Date  . Anemia   . Ankle fracture   . Arthritis   . Blood transfusion without reported diagnosis   . Breast cancer ([place])   . Cancer ([place])   . Diabetes mellitus without complication ([place])   . Dialysis patient ([place])    mon, wed, friday,   . End stage renal disease on dialysis [facility])    M/W/F [facility] in [place]  . GERD (gastroesophageal reflux disease)   . Hypertension   . OSA (obstructive sleep apnea)    uses CPAP sometimes  . Pneumonia   . PONV (postoperative nausea and vomiting)   . Wears glasses     Past Surgical History:  Procedure Laterality Date  . ABDOMINAL HYSTERECTOMY    . AV FISTULA PLACEMENT  11/2014   at [place] N/A 07/10/2016   Procedure: BALLOON DILATION;  Surgeon: Danie Binder, MD;  Location[facility];  Service: Endoscopy;  Laterality: N/A;  Pyloric dilation  . BREAST LUMPECTOMY    . CESAREAN SECTION    . CHOLECYSTECTOMY    . COLONOSCOPY WITH PROPOFOL N/A 09/27/2016   Dr. Gala Romney: Internal hemorrhoids repeat colonoscopy in 10 years  . DILATION AND CURETTAGE OF UTERUS    . ESOPHAGOGASTRODUODENOSCOPY N/A 07/10/2016   Dr.Fields- normal esophagus, gastric stenosis was found at the pylorus, gastritis on  bx, normal examined duodenun  . EXTERNAL FIXATION REMOVAL Right 10/29/2018   Procedure: REMOVAL RIGHT ANKLE BIOMET ZIMMER EXTERNAL FIXATOR, SHORT LEG CAST APPLICATION;  Surgeon: Marybelle Killings, MD;  Location: [place];  Service: Orthopedics;  Laterality: Right;  . MASTECTOMY     left sided  . ORIF ANKLE FRACTURE Right 10/06/2018   Procedure: OPEN REDUCTION INTERNAL FIXATION (ORIF) RIGHT ANKLE TRIMALLEOLAR;  Surgeon: Marybelle Killings, MD;  Location: [place];  Service: Orthopedics;  Laterality: Right;    There were no vitals filed for this visit.   Subjective Assessment - 09/06/20 0846    Subjective has had to cx the past couple weeks due to illness and mother hospitalized.  feels like she cannot dedicate her time to PT at this time.    Limitations Walking   steps   Patient Stated Goals improve pain    Currently in Pain? No/denies    Pain Onset --              [facility] PT Assessment - 09/06/20 0909      Assessment   Medical Diagnosis Post trimalleolar ankle ORIf     Referring Provider (PT) Marybelle Killings, MD      AROM   Right Ankle Dorsiflexion 0      Strength  Right Ankle Dorsiflexion 5/5    Right Ankle Plantar Flexion 2/5    Right Ankle Inversion 3-/5    Right Ankle Eversion 3-/5                         [facility] Adult PT Treatment/Exercise - 09/06/20 0851      Ankle Exercises: Aerobic   Nustep L6 x 8 min; LEs only      Ankle Exercises: Seated   Other Seated Ankle Exercises L2 ankle 4 way RLE 20 reps each direction      Ankle Exercises: Stretches   Soleus Stretch 3 reps;30 seconds    Gastroc Stretch 3 reps;30 seconds      Ankle Exercises: Standing   SLS RLE only 5x10 sec with intermittent UE support   up to 8 sec without UE support                 PT Education - 09/06/20 0920    Education Details HEP    Person(s) Educated Patient    Methods Explanation;Demonstration;Handout    Comprehension Verbalized understanding;Returned demonstration             PT Short Term Goals - 08/16/20 1010      PT SHORT TERM GOAL #1   Title report compliance with HEP    Baseline 8/31: left band at sisters so hasn't been able due to this    Status Partially Met    Target Date 08/11/20             PT [place] Term Goals - 09/06/20 1306      PT [place] TERM GOAL #1   Title independent with HEP    Status Achieved      PT [place] TERM GOAL #2   Title improve Rt ankle DF AROM to at least 4 deg past neutral for improved function    Status Not Met      PT [place] TERM GOAL #3   Title report pain < 6/10 with ascending/descending stairs or curb for improved function    Status Not Met      PT [place] TERM GOAL #4   Title improve Rt ankle strength to at least 4/5 for improved mobility    Status Partially Met      PT [place] TERM GOAL #5   Title demonstrate RLE SLS to at least 8 sec for improved balance and strength    Status Achieved                 Plan - 09/06/20 1307    Clinical Impression Statement Pt has requested d/c at this time due to outside stress with family and need to prioritize mother's health following aortic valve repair.  She has met 2 LTGs, partially met strength goal and did not meet other goals.  She has HEP to address these areas and will plan to get a new referral when able to consistently attend PT.    Personal Factors and Comorbidities Comorbidity 3+    Comorbidities breast cancer, DM, ESRD on dialysis M/W/F, HTN, OSA    Examination-Activity Limitations Locomotion Level;Stairs;Stand    Examination-Participation Restrictions Community Activity    Stability/Clinical Decision Making Evolving/Moderate complexity    Rehab Potential Good    PT Frequency 1x / week   recommend 1-2x/wk; likely 1x/wk due to limited availability from dialysis   PT Duration 6 weeks    PT Treatment/Interventions ADLs/Self Care Home Management;Cryotherapy;Electrical Stimulation;Ultrasound;Moist Heat;Iontophoresis 24m/ml Dexamethasone;Gait training;Stair  training;Functional mobility training;Therapeutic activities;Therapeutic exercise;Balance training;Patient/family education;Neuromuscular re-education;Manual techniques;Vasopneumatic Device;Taping;Dry needling;Passive range of motion    PT Next Visit Plan d/c PT    PT Home Exercise Plan Access Code: [email or web address]    Consulted and Agree with Plan of Care Patient           Patient will benefit from skilled therapeutic intervention in order to improve the following deficits and impairments:  Abnormal gait, Pain, Decreased mobility, Decreased range of motion, Decreased strength, Impaired flexibility, Increased edema, Difficulty walking, Decreased balance, Decreased activity tolerance  Visit Diagnosis: Pain in right ankle and joints of right foot  Stiffness of right ankle, not elsewhere classified  Other abnormalities of gait and mobility  Unsteadiness on feet     Problem List Patient Active Problem List   Diagnosis Date Noted  . Syncope 08/03/2020  . Acute pulmonary edema ([place]) 01/11/2020  . OSA (obstructive sleep apnea)   . Closed right ankle fracture 10/06/2018  . Closed displaced trimalleolar fracture of right ankle 10/06/2018  . Acute colitis 02/26/2018  . Hypoxemia 02/24/2018  . Dyspnea 11/26/2017  . Anemia 11/26/2017  . Elevated troponin 11/26/2017  . Hyperkalemia 09/29/2017  . Acute respiratory failure ([place]) 09/28/2017  . CAP (community acquired pneumonia) 09/26/2017  . Volume overload 09/25/2017  . Acute on chronic diastolic CHF (congestive heart failure) ([place]) 09/25/2017  . Lactic acidosis 09/25/2017  . Hypokalemia 09/25/2017  . Breast cancer ([place]) 07/22/2017  . SOB (shortness of breath)   . Acute respiratory failure with hypoxia ([place]) 07/21/2017  . Flatulence 10/25/2016  . GERD (gastroesophageal reflux disease) 08/02/2016  . Pyloric stenosis, acquired   . Nausea with vomiting   . Generalized abdominal pain   . Chest pain, rule out acute myocardial infarction  07/04/2016  . Constipation 07/04/2016  . Nausea 07/04/2016  . Essential hypertension 07/04/2016  . HCAP (healthcare-associated pneumonia) 12/04/2015  . End stage renal disease on dialysis ([place]) 12/04/2015  . Type 2 diabetes mellitus ([place]) 12/04/2015      Laureen Abrahams, PT, DPT 09/06/20 1:11 PM    [facility] [address] [place], [place], [address] Phone: [phone]   Fax:  [phone]  Name: Nakina Spatz MRN: [id number] Date of Birth: 1971-05-08     PHYSICAL THERAPY DISCHARGE SUMMARY  Visits from Start of Care: 3  Current functional level related to goals / functional outcomes: See above   Remaining deficits: See above   Education / Equipment: HEP  Plan: Patient agrees to discharge.  Patient goals were partially met. Patient is being discharged due to the patient's request.  ?????     Laureen Abrahams, PT, DPT 09/06/20 1:12 PM   [facility] [address] [place], [place], [address] Phone: [phone]   Fax:  [phone]

## 2020-09-07 DIAGNOSIS — R1110 Vomiting, unspecified: Secondary | ICD-10-CM | POA: Diagnosis not present

## 2020-09-07 DIAGNOSIS — N186 End stage renal disease: Secondary | ICD-10-CM | POA: Diagnosis not present

## 2020-09-07 DIAGNOSIS — D509 Iron deficiency anemia, unspecified: Secondary | ICD-10-CM | POA: Diagnosis not present

## 2020-09-07 DIAGNOSIS — N2581 Secondary hyperparathyroidism of renal origin: Secondary | ICD-10-CM | POA: Diagnosis not present

## 2020-09-07 DIAGNOSIS — Z992 Dependence on renal dialysis: Secondary | ICD-10-CM | POA: Diagnosis not present

## 2020-09-07 DIAGNOSIS — R110 Nausea: Secondary | ICD-10-CM | POA: Diagnosis not present

## 2020-09-08 NOTE — Telephone Encounter (Signed)
Received an approval letter from pt assistance company [employer]. pts Linzess 290 mcg has been approved and will be delivered to the office. Pt is aware and will be called when medication arrives.

## 2020-09-09 DIAGNOSIS — N186 End stage renal disease: Secondary | ICD-10-CM | POA: Diagnosis not present

## 2020-09-09 DIAGNOSIS — R110 Nausea: Secondary | ICD-10-CM | POA: Diagnosis not present

## 2020-09-09 DIAGNOSIS — N2581 Secondary hyperparathyroidism of renal origin: Secondary | ICD-10-CM | POA: Diagnosis not present

## 2020-09-09 DIAGNOSIS — Z992 Dependence on renal dialysis: Secondary | ICD-10-CM | POA: Diagnosis not present

## 2020-09-09 DIAGNOSIS — R1110 Vomiting, unspecified: Secondary | ICD-10-CM | POA: Diagnosis not present

## 2020-09-09 DIAGNOSIS — D509 Iron deficiency anemia, unspecified: Secondary | ICD-10-CM | POA: Diagnosis not present

## 2020-09-12 DIAGNOSIS — R110 Nausea: Secondary | ICD-10-CM | POA: Diagnosis not present

## 2020-09-12 DIAGNOSIS — N2581 Secondary hyperparathyroidism of renal origin: Secondary | ICD-10-CM | POA: Diagnosis not present

## 2020-09-12 DIAGNOSIS — D509 Iron deficiency anemia, unspecified: Secondary | ICD-10-CM | POA: Diagnosis not present

## 2020-09-12 DIAGNOSIS — N186 End stage renal disease: Secondary | ICD-10-CM | POA: Diagnosis not present

## 2020-09-12 DIAGNOSIS — Z992 Dependence on renal dialysis: Secondary | ICD-10-CM | POA: Diagnosis not present

## 2020-09-12 DIAGNOSIS — R1110 Vomiting, unspecified: Secondary | ICD-10-CM | POA: Diagnosis not present

## 2020-09-13 ENCOUNTER — Encounter: Payer: Medicare Other | Admitting: Physical Therapy

## 2020-09-14 DIAGNOSIS — D509 Iron deficiency anemia, unspecified: Secondary | ICD-10-CM | POA: Diagnosis not present

## 2020-09-14 DIAGNOSIS — R110 Nausea: Secondary | ICD-10-CM | POA: Diagnosis not present

## 2020-09-14 DIAGNOSIS — R1110 Vomiting, unspecified: Secondary | ICD-10-CM | POA: Diagnosis not present

## 2020-09-14 DIAGNOSIS — Z992 Dependence on renal dialysis: Secondary | ICD-10-CM | POA: Diagnosis not present

## 2020-09-14 DIAGNOSIS — N2581 Secondary hyperparathyroidism of renal origin: Secondary | ICD-10-CM | POA: Diagnosis not present

## 2020-09-14 DIAGNOSIS — N186 End stage renal disease: Secondary | ICD-10-CM | POA: Diagnosis not present

## 2020-09-15 ENCOUNTER — Telehealth: Payer: Self-pay | Admitting: Internal Medicine

## 2020-09-15 DIAGNOSIS — N184 Chronic kidney disease, stage 4 (severe): Secondary | ICD-10-CM | POA: Diagnosis not present

## 2020-09-15 DIAGNOSIS — E1165 Type 2 diabetes mellitus with hyperglycemia: Secondary | ICD-10-CM | POA: Diagnosis not present

## 2020-09-15 DIAGNOSIS — N186 End stage renal disease: Secondary | ICD-10-CM | POA: Diagnosis not present

## 2020-09-15 DIAGNOSIS — Z992 Dependence on renal dialysis: Secondary | ICD-10-CM | POA: Diagnosis not present

## 2020-09-15 DIAGNOSIS — I10 Essential (primary) hypertension: Secondary | ICD-10-CM | POA: Diagnosis not present

## 2020-09-15 NOTE — Telephone Encounter (Signed)
Pt wanted Patricia [name] to know her Linzess was delivered to her house.

## 2020-09-15 NOTE — Telephone Encounter (Signed)
Noted. Pt assistance Linzess medication was delivered to pt.

## 2020-09-16 DIAGNOSIS — N2581 Secondary hyperparathyroidism of renal origin: Secondary | ICD-10-CM | POA: Diagnosis not present

## 2020-09-16 DIAGNOSIS — R110 Nausea: Secondary | ICD-10-CM | POA: Diagnosis not present

## 2020-09-16 DIAGNOSIS — I509 Heart failure, unspecified: Secondary | ICD-10-CM | POA: Diagnosis not present

## 2020-09-16 DIAGNOSIS — N186 End stage renal disease: Secondary | ICD-10-CM | POA: Diagnosis not present

## 2020-09-16 DIAGNOSIS — Z23 Encounter for immunization: Secondary | ICD-10-CM | POA: Diagnosis not present

## 2020-09-16 DIAGNOSIS — D509 Iron deficiency anemia, unspecified: Secondary | ICD-10-CM | POA: Diagnosis not present

## 2020-09-16 DIAGNOSIS — D631 Anemia in chronic kidney disease: Secondary | ICD-10-CM | POA: Diagnosis not present

## 2020-09-16 DIAGNOSIS — R1110 Vomiting, unspecified: Secondary | ICD-10-CM | POA: Diagnosis not present

## 2020-09-16 DIAGNOSIS — Z6841 Body Mass Index (BMI) 40.0 and over, adult: Secondary | ICD-10-CM | POA: Diagnosis not present

## 2020-09-16 DIAGNOSIS — Z992 Dependence on renal dialysis: Secondary | ICD-10-CM | POA: Diagnosis not present

## 2020-09-16 DIAGNOSIS — E7849 Other hyperlipidemia: Secondary | ICD-10-CM | POA: Diagnosis not present

## 2020-09-16 DIAGNOSIS — E1165 Type 2 diabetes mellitus with hyperglycemia: Secondary | ICD-10-CM | POA: Diagnosis not present

## 2020-09-16 DIAGNOSIS — I10 Essential (primary) hypertension: Secondary | ICD-10-CM | POA: Diagnosis not present

## 2020-09-19 DIAGNOSIS — N186 End stage renal disease: Secondary | ICD-10-CM | POA: Diagnosis not present

## 2020-09-19 DIAGNOSIS — Z23 Encounter for immunization: Secondary | ICD-10-CM | POA: Diagnosis not present

## 2020-09-19 DIAGNOSIS — N2581 Secondary hyperparathyroidism of renal origin: Secondary | ICD-10-CM | POA: Diagnosis not present

## 2020-09-19 DIAGNOSIS — D509 Iron deficiency anemia, unspecified: Secondary | ICD-10-CM | POA: Diagnosis not present

## 2020-09-19 DIAGNOSIS — R1110 Vomiting, unspecified: Secondary | ICD-10-CM | POA: Diagnosis not present

## 2020-09-19 DIAGNOSIS — R110 Nausea: Secondary | ICD-10-CM | POA: Diagnosis not present

## 2020-09-21 DIAGNOSIS — Z23 Encounter for immunization: Secondary | ICD-10-CM | POA: Diagnosis not present

## 2020-09-21 DIAGNOSIS — N2581 Secondary hyperparathyroidism of renal origin: Secondary | ICD-10-CM | POA: Diagnosis not present

## 2020-09-21 DIAGNOSIS — D509 Iron deficiency anemia, unspecified: Secondary | ICD-10-CM | POA: Diagnosis not present

## 2020-09-21 DIAGNOSIS — R110 Nausea: Secondary | ICD-10-CM | POA: Diagnosis not present

## 2020-09-21 DIAGNOSIS — N186 End stage renal disease: Secondary | ICD-10-CM | POA: Diagnosis not present

## 2020-09-21 DIAGNOSIS — R1110 Vomiting, unspecified: Secondary | ICD-10-CM | POA: Diagnosis not present

## 2020-09-23 DIAGNOSIS — Z23 Encounter for immunization: Secondary | ICD-10-CM | POA: Diagnosis not present

## 2020-09-23 DIAGNOSIS — D509 Iron deficiency anemia, unspecified: Secondary | ICD-10-CM | POA: Diagnosis not present

## 2020-09-23 DIAGNOSIS — R1110 Vomiting, unspecified: Secondary | ICD-10-CM | POA: Diagnosis not present

## 2020-09-23 DIAGNOSIS — N186 End stage renal disease: Secondary | ICD-10-CM | POA: Diagnosis not present

## 2020-09-23 DIAGNOSIS — N2581 Secondary hyperparathyroidism of renal origin: Secondary | ICD-10-CM | POA: Diagnosis not present

## 2020-09-23 DIAGNOSIS — R110 Nausea: Secondary | ICD-10-CM | POA: Diagnosis not present

## 2020-09-26 DIAGNOSIS — Z992 Dependence on renal dialysis: Secondary | ICD-10-CM | POA: Diagnosis not present

## 2020-09-26 DIAGNOSIS — D509 Iron deficiency anemia, unspecified: Secondary | ICD-10-CM | POA: Diagnosis not present

## 2020-09-26 DIAGNOSIS — N2581 Secondary hyperparathyroidism of renal origin: Secondary | ICD-10-CM | POA: Diagnosis not present

## 2020-09-26 DIAGNOSIS — Z23 Encounter for immunization: Secondary | ICD-10-CM | POA: Diagnosis not present

## 2020-09-26 DIAGNOSIS — R110 Nausea: Secondary | ICD-10-CM | POA: Diagnosis not present

## 2020-09-26 DIAGNOSIS — N186 End stage renal disease: Secondary | ICD-10-CM | POA: Diagnosis not present

## 2020-09-26 DIAGNOSIS — R1110 Vomiting, unspecified: Secondary | ICD-10-CM | POA: Diagnosis not present

## 2020-09-28 DIAGNOSIS — R110 Nausea: Secondary | ICD-10-CM | POA: Diagnosis not present

## 2020-09-28 DIAGNOSIS — D509 Iron deficiency anemia, unspecified: Secondary | ICD-10-CM | POA: Diagnosis not present

## 2020-09-28 DIAGNOSIS — R1110 Vomiting, unspecified: Secondary | ICD-10-CM | POA: Diagnosis not present

## 2020-09-28 DIAGNOSIS — Z23 Encounter for immunization: Secondary | ICD-10-CM | POA: Diagnosis not present

## 2020-09-28 DIAGNOSIS — N2581 Secondary hyperparathyroidism of renal origin: Secondary | ICD-10-CM | POA: Diagnosis not present

## 2020-09-28 DIAGNOSIS — N186 End stage renal disease: Secondary | ICD-10-CM | POA: Diagnosis not present

## 2020-09-29 DIAGNOSIS — D509 Iron deficiency anemia, unspecified: Secondary | ICD-10-CM | POA: Diagnosis not present

## 2020-09-29 DIAGNOSIS — N2581 Secondary hyperparathyroidism of renal origin: Secondary | ICD-10-CM | POA: Diagnosis not present

## 2020-09-29 DIAGNOSIS — N186 End stage renal disease: Secondary | ICD-10-CM | POA: Diagnosis not present

## 2020-09-29 DIAGNOSIS — Z23 Encounter for immunization: Secondary | ICD-10-CM | POA: Diagnosis not present

## 2020-09-29 DIAGNOSIS — R110 Nausea: Secondary | ICD-10-CM | POA: Diagnosis not present

## 2020-09-29 DIAGNOSIS — R1110 Vomiting, unspecified: Secondary | ICD-10-CM | POA: Diagnosis not present

## 2020-09-30 DIAGNOSIS — D509 Iron deficiency anemia, unspecified: Secondary | ICD-10-CM | POA: Diagnosis not present

## 2020-09-30 DIAGNOSIS — R1110 Vomiting, unspecified: Secondary | ICD-10-CM | POA: Diagnosis not present

## 2020-09-30 DIAGNOSIS — N2581 Secondary hyperparathyroidism of renal origin: Secondary | ICD-10-CM | POA: Diagnosis not present

## 2020-09-30 DIAGNOSIS — Z23 Encounter for immunization: Secondary | ICD-10-CM | POA: Diagnosis not present

## 2020-09-30 DIAGNOSIS — R110 Nausea: Secondary | ICD-10-CM | POA: Diagnosis not present

## 2020-09-30 DIAGNOSIS — N186 End stage renal disease: Secondary | ICD-10-CM | POA: Diagnosis not present

## 2020-10-03 DIAGNOSIS — R110 Nausea: Secondary | ICD-10-CM | POA: Diagnosis not present

## 2020-10-03 DIAGNOSIS — N2581 Secondary hyperparathyroidism of renal origin: Secondary | ICD-10-CM | POA: Diagnosis not present

## 2020-10-03 DIAGNOSIS — N186 End stage renal disease: Secondary | ICD-10-CM | POA: Diagnosis not present

## 2020-10-03 DIAGNOSIS — R1110 Vomiting, unspecified: Secondary | ICD-10-CM | POA: Diagnosis not present

## 2020-10-03 DIAGNOSIS — D509 Iron deficiency anemia, unspecified: Secondary | ICD-10-CM | POA: Diagnosis not present

## 2020-10-03 DIAGNOSIS — Z23 Encounter for immunization: Secondary | ICD-10-CM | POA: Diagnosis not present

## 2020-10-05 DIAGNOSIS — N186 End stage renal disease: Secondary | ICD-10-CM | POA: Diagnosis not present

## 2020-10-05 DIAGNOSIS — R110 Nausea: Secondary | ICD-10-CM | POA: Diagnosis not present

## 2020-10-05 DIAGNOSIS — D509 Iron deficiency anemia, unspecified: Secondary | ICD-10-CM | POA: Diagnosis not present

## 2020-10-05 DIAGNOSIS — R1110 Vomiting, unspecified: Secondary | ICD-10-CM | POA: Diagnosis not present

## 2020-10-05 DIAGNOSIS — Z23 Encounter for immunization: Secondary | ICD-10-CM | POA: Diagnosis not present

## 2020-10-05 DIAGNOSIS — N2581 Secondary hyperparathyroidism of renal origin: Secondary | ICD-10-CM | POA: Diagnosis not present

## 2020-10-07 DIAGNOSIS — N2581 Secondary hyperparathyroidism of renal origin: Secondary | ICD-10-CM | POA: Diagnosis not present

## 2020-10-07 DIAGNOSIS — R110 Nausea: Secondary | ICD-10-CM | POA: Diagnosis not present

## 2020-10-07 DIAGNOSIS — Z23 Encounter for immunization: Secondary | ICD-10-CM | POA: Diagnosis not present

## 2020-10-07 DIAGNOSIS — N186 End stage renal disease: Secondary | ICD-10-CM | POA: Diagnosis not present

## 2020-10-07 DIAGNOSIS — R1110 Vomiting, unspecified: Secondary | ICD-10-CM | POA: Diagnosis not present

## 2020-10-07 DIAGNOSIS — D509 Iron deficiency anemia, unspecified: Secondary | ICD-10-CM | POA: Diagnosis not present

## 2020-10-10 DIAGNOSIS — Z23 Encounter for immunization: Secondary | ICD-10-CM | POA: Diagnosis not present

## 2020-10-10 DIAGNOSIS — D509 Iron deficiency anemia, unspecified: Secondary | ICD-10-CM | POA: Diagnosis not present

## 2020-10-10 DIAGNOSIS — R1110 Vomiting, unspecified: Secondary | ICD-10-CM | POA: Diagnosis not present

## 2020-10-10 DIAGNOSIS — N186 End stage renal disease: Secondary | ICD-10-CM | POA: Diagnosis not present

## 2020-10-10 DIAGNOSIS — N2581 Secondary hyperparathyroidism of renal origin: Secondary | ICD-10-CM | POA: Diagnosis not present

## 2020-10-10 DIAGNOSIS — R110 Nausea: Secondary | ICD-10-CM | POA: Diagnosis not present

## 2020-10-12 DIAGNOSIS — R110 Nausea: Secondary | ICD-10-CM | POA: Diagnosis not present

## 2020-10-12 DIAGNOSIS — N186 End stage renal disease: Secondary | ICD-10-CM | POA: Diagnosis not present

## 2020-10-12 DIAGNOSIS — N2581 Secondary hyperparathyroidism of renal origin: Secondary | ICD-10-CM | POA: Diagnosis not present

## 2020-10-12 DIAGNOSIS — R1110 Vomiting, unspecified: Secondary | ICD-10-CM | POA: Diagnosis not present

## 2020-10-12 DIAGNOSIS — Z23 Encounter for immunization: Secondary | ICD-10-CM | POA: Diagnosis not present

## 2020-10-12 DIAGNOSIS — D509 Iron deficiency anemia, unspecified: Secondary | ICD-10-CM | POA: Diagnosis not present

## 2020-10-14 DIAGNOSIS — N186 End stage renal disease: Secondary | ICD-10-CM | POA: Diagnosis not present

## 2020-10-14 DIAGNOSIS — D509 Iron deficiency anemia, unspecified: Secondary | ICD-10-CM | POA: Diagnosis not present

## 2020-10-14 DIAGNOSIS — R1110 Vomiting, unspecified: Secondary | ICD-10-CM | POA: Diagnosis not present

## 2020-10-14 DIAGNOSIS — R110 Nausea: Secondary | ICD-10-CM | POA: Diagnosis not present

## 2020-10-14 DIAGNOSIS — N2581 Secondary hyperparathyroidism of renal origin: Secondary | ICD-10-CM | POA: Diagnosis not present

## 2020-10-14 DIAGNOSIS — Z23 Encounter for immunization: Secondary | ICD-10-CM | POA: Diagnosis not present

## 2020-10-15 DIAGNOSIS — E7849 Other hyperlipidemia: Secondary | ICD-10-CM | POA: Diagnosis not present

## 2020-10-15 DIAGNOSIS — E6609 Other obesity due to excess calories: Secondary | ICD-10-CM | POA: Diagnosis not present

## 2020-10-15 DIAGNOSIS — N184 Chronic kidney disease, stage 4 (severe): Secondary | ICD-10-CM | POA: Diagnosis not present

## 2020-10-15 DIAGNOSIS — I10 Essential (primary) hypertension: Secondary | ICD-10-CM | POA: Diagnosis not present

## 2020-10-16 DIAGNOSIS — Z992 Dependence on renal dialysis: Secondary | ICD-10-CM | POA: Diagnosis not present

## 2020-10-16 DIAGNOSIS — N186 End stage renal disease: Secondary | ICD-10-CM | POA: Diagnosis not present

## 2020-10-17 DIAGNOSIS — R1110 Vomiting, unspecified: Secondary | ICD-10-CM | POA: Diagnosis not present

## 2020-10-17 DIAGNOSIS — D509 Iron deficiency anemia, unspecified: Secondary | ICD-10-CM | POA: Diagnosis not present

## 2020-10-17 DIAGNOSIS — R110 Nausea: Secondary | ICD-10-CM | POA: Diagnosis not present

## 2020-10-17 DIAGNOSIS — N186 End stage renal disease: Secondary | ICD-10-CM | POA: Diagnosis not present

## 2020-10-17 DIAGNOSIS — Z992 Dependence on renal dialysis: Secondary | ICD-10-CM | POA: Diagnosis not present

## 2020-10-17 DIAGNOSIS — N2581 Secondary hyperparathyroidism of renal origin: Secondary | ICD-10-CM | POA: Diagnosis not present

## 2020-10-17 DIAGNOSIS — D631 Anemia in chronic kidney disease: Secondary | ICD-10-CM | POA: Diagnosis not present

## 2020-10-19 DIAGNOSIS — N2581 Secondary hyperparathyroidism of renal origin: Secondary | ICD-10-CM | POA: Diagnosis not present

## 2020-10-19 DIAGNOSIS — Z992 Dependence on renal dialysis: Secondary | ICD-10-CM | POA: Diagnosis not present

## 2020-10-19 DIAGNOSIS — N186 End stage renal disease: Secondary | ICD-10-CM | POA: Diagnosis not present

## 2020-10-19 DIAGNOSIS — D509 Iron deficiency anemia, unspecified: Secondary | ICD-10-CM | POA: Diagnosis not present

## 2020-10-19 DIAGNOSIS — R1110 Vomiting, unspecified: Secondary | ICD-10-CM | POA: Diagnosis not present

## 2020-10-19 DIAGNOSIS — R110 Nausea: Secondary | ICD-10-CM | POA: Diagnosis not present

## 2020-10-21 DIAGNOSIS — N186 End stage renal disease: Secondary | ICD-10-CM | POA: Diagnosis not present

## 2020-10-21 DIAGNOSIS — Z992 Dependence on renal dialysis: Secondary | ICD-10-CM | POA: Diagnosis not present

## 2020-10-21 DIAGNOSIS — N2581 Secondary hyperparathyroidism of renal origin: Secondary | ICD-10-CM | POA: Diagnosis not present

## 2020-10-21 DIAGNOSIS — R110 Nausea: Secondary | ICD-10-CM | POA: Diagnosis not present

## 2020-10-21 DIAGNOSIS — R1110 Vomiting, unspecified: Secondary | ICD-10-CM | POA: Diagnosis not present

## 2020-10-21 DIAGNOSIS — D509 Iron deficiency anemia, unspecified: Secondary | ICD-10-CM | POA: Diagnosis not present

## 2020-10-24 DIAGNOSIS — N2581 Secondary hyperparathyroidism of renal origin: Secondary | ICD-10-CM | POA: Diagnosis not present

## 2020-10-24 DIAGNOSIS — R110 Nausea: Secondary | ICD-10-CM | POA: Diagnosis not present

## 2020-10-24 DIAGNOSIS — R1110 Vomiting, unspecified: Secondary | ICD-10-CM | POA: Diagnosis not present

## 2020-10-24 DIAGNOSIS — N186 End stage renal disease: Secondary | ICD-10-CM | POA: Diagnosis not present

## 2020-10-24 DIAGNOSIS — D509 Iron deficiency anemia, unspecified: Secondary | ICD-10-CM | POA: Diagnosis not present

## 2020-10-24 DIAGNOSIS — Z992 Dependence on renal dialysis: Secondary | ICD-10-CM | POA: Diagnosis not present

## 2020-10-26 DIAGNOSIS — N186 End stage renal disease: Secondary | ICD-10-CM | POA: Diagnosis not present

## 2020-10-26 DIAGNOSIS — R1110 Vomiting, unspecified: Secondary | ICD-10-CM | POA: Diagnosis not present

## 2020-10-26 DIAGNOSIS — N2581 Secondary hyperparathyroidism of renal origin: Secondary | ICD-10-CM | POA: Diagnosis not present

## 2020-10-26 DIAGNOSIS — D509 Iron deficiency anemia, unspecified: Secondary | ICD-10-CM | POA: Diagnosis not present

## 2020-10-26 DIAGNOSIS — R110 Nausea: Secondary | ICD-10-CM | POA: Diagnosis not present

## 2020-10-26 DIAGNOSIS — Z992 Dependence on renal dialysis: Secondary | ICD-10-CM | POA: Diagnosis not present

## 2020-10-28 DIAGNOSIS — Z992 Dependence on renal dialysis: Secondary | ICD-10-CM | POA: Diagnosis not present

## 2020-10-28 DIAGNOSIS — R110 Nausea: Secondary | ICD-10-CM | POA: Diagnosis not present

## 2020-10-28 DIAGNOSIS — N186 End stage renal disease: Secondary | ICD-10-CM | POA: Diagnosis not present

## 2020-10-28 DIAGNOSIS — D509 Iron deficiency anemia, unspecified: Secondary | ICD-10-CM | POA: Diagnosis not present

## 2020-10-28 DIAGNOSIS — R1110 Vomiting, unspecified: Secondary | ICD-10-CM | POA: Diagnosis not present

## 2020-10-28 DIAGNOSIS — N2581 Secondary hyperparathyroidism of renal origin: Secondary | ICD-10-CM | POA: Diagnosis not present

## 2020-10-29 DIAGNOSIS — R110 Nausea: Secondary | ICD-10-CM | POA: Diagnosis not present

## 2020-10-29 DIAGNOSIS — R1110 Vomiting, unspecified: Secondary | ICD-10-CM | POA: Diagnosis not present

## 2020-10-29 DIAGNOSIS — D509 Iron deficiency anemia, unspecified: Secondary | ICD-10-CM | POA: Diagnosis not present

## 2020-10-29 DIAGNOSIS — Z992 Dependence on renal dialysis: Secondary | ICD-10-CM | POA: Diagnosis not present

## 2020-10-29 DIAGNOSIS — N2581 Secondary hyperparathyroidism of renal origin: Secondary | ICD-10-CM | POA: Diagnosis not present

## 2020-10-29 DIAGNOSIS — N186 End stage renal disease: Secondary | ICD-10-CM | POA: Diagnosis not present

## 2020-10-31 DIAGNOSIS — D509 Iron deficiency anemia, unspecified: Secondary | ICD-10-CM | POA: Diagnosis not present

## 2020-10-31 DIAGNOSIS — R110 Nausea: Secondary | ICD-10-CM | POA: Diagnosis not present

## 2020-10-31 DIAGNOSIS — N186 End stage renal disease: Secondary | ICD-10-CM | POA: Diagnosis not present

## 2020-10-31 DIAGNOSIS — R1110 Vomiting, unspecified: Secondary | ICD-10-CM | POA: Diagnosis not present

## 2020-10-31 DIAGNOSIS — N2581 Secondary hyperparathyroidism of renal origin: Secondary | ICD-10-CM | POA: Diagnosis not present

## 2020-10-31 DIAGNOSIS — Z992 Dependence on renal dialysis: Secondary | ICD-10-CM | POA: Diagnosis not present

## 2020-11-02 DIAGNOSIS — N2581 Secondary hyperparathyroidism of renal origin: Secondary | ICD-10-CM | POA: Diagnosis not present

## 2020-11-02 DIAGNOSIS — N186 End stage renal disease: Secondary | ICD-10-CM | POA: Diagnosis not present

## 2020-11-02 DIAGNOSIS — R110 Nausea: Secondary | ICD-10-CM | POA: Diagnosis not present

## 2020-11-02 DIAGNOSIS — D509 Iron deficiency anemia, unspecified: Secondary | ICD-10-CM | POA: Diagnosis not present

## 2020-11-02 DIAGNOSIS — Z992 Dependence on renal dialysis: Secondary | ICD-10-CM | POA: Diagnosis not present

## 2020-11-02 DIAGNOSIS — R1110 Vomiting, unspecified: Secondary | ICD-10-CM | POA: Diagnosis not present

## 2020-11-04 DIAGNOSIS — N2581 Secondary hyperparathyroidism of renal origin: Secondary | ICD-10-CM | POA: Diagnosis not present

## 2020-11-04 DIAGNOSIS — Z992 Dependence on renal dialysis: Secondary | ICD-10-CM | POA: Diagnosis not present

## 2020-11-04 DIAGNOSIS — R110 Nausea: Secondary | ICD-10-CM | POA: Diagnosis not present

## 2020-11-04 DIAGNOSIS — N186 End stage renal disease: Secondary | ICD-10-CM | POA: Diagnosis not present

## 2020-11-04 DIAGNOSIS — R1110 Vomiting, unspecified: Secondary | ICD-10-CM | POA: Diagnosis not present

## 2020-11-04 DIAGNOSIS — D509 Iron deficiency anemia, unspecified: Secondary | ICD-10-CM | POA: Diagnosis not present

## 2020-11-07 DIAGNOSIS — D509 Iron deficiency anemia, unspecified: Secondary | ICD-10-CM | POA: Diagnosis not present

## 2020-11-07 DIAGNOSIS — R1110 Vomiting, unspecified: Secondary | ICD-10-CM | POA: Diagnosis not present

## 2020-11-07 DIAGNOSIS — R110 Nausea: Secondary | ICD-10-CM | POA: Diagnosis not present

## 2020-11-07 DIAGNOSIS — N186 End stage renal disease: Secondary | ICD-10-CM | POA: Diagnosis not present

## 2020-11-07 DIAGNOSIS — Z992 Dependence on renal dialysis: Secondary | ICD-10-CM | POA: Diagnosis not present

## 2020-11-07 DIAGNOSIS — N2581 Secondary hyperparathyroidism of renal origin: Secondary | ICD-10-CM | POA: Diagnosis not present

## 2020-11-09 DIAGNOSIS — Z992 Dependence on renal dialysis: Secondary | ICD-10-CM | POA: Diagnosis not present

## 2020-11-09 DIAGNOSIS — R1110 Vomiting, unspecified: Secondary | ICD-10-CM | POA: Diagnosis not present

## 2020-11-09 DIAGNOSIS — D509 Iron deficiency anemia, unspecified: Secondary | ICD-10-CM | POA: Diagnosis not present

## 2020-11-09 DIAGNOSIS — N2581 Secondary hyperparathyroidism of renal origin: Secondary | ICD-10-CM | POA: Diagnosis not present

## 2020-11-09 DIAGNOSIS — N186 End stage renal disease: Secondary | ICD-10-CM | POA: Diagnosis not present

## 2020-11-09 DIAGNOSIS — R110 Nausea: Secondary | ICD-10-CM | POA: Diagnosis not present

## 2020-11-11 DIAGNOSIS — R1110 Vomiting, unspecified: Secondary | ICD-10-CM | POA: Diagnosis not present

## 2020-11-11 DIAGNOSIS — N2581 Secondary hyperparathyroidism of renal origin: Secondary | ICD-10-CM | POA: Diagnosis not present

## 2020-11-11 DIAGNOSIS — R110 Nausea: Secondary | ICD-10-CM | POA: Diagnosis not present

## 2020-11-11 DIAGNOSIS — Z992 Dependence on renal dialysis: Secondary | ICD-10-CM | POA: Diagnosis not present

## 2020-11-11 DIAGNOSIS — D509 Iron deficiency anemia, unspecified: Secondary | ICD-10-CM | POA: Diagnosis not present

## 2020-11-11 DIAGNOSIS — N186 End stage renal disease: Secondary | ICD-10-CM | POA: Diagnosis not present

## 2020-11-14 DIAGNOSIS — N2581 Secondary hyperparathyroidism of renal origin: Secondary | ICD-10-CM | POA: Diagnosis not present

## 2020-11-14 DIAGNOSIS — R1110 Vomiting, unspecified: Secondary | ICD-10-CM | POA: Diagnosis not present

## 2020-11-14 DIAGNOSIS — D509 Iron deficiency anemia, unspecified: Secondary | ICD-10-CM | POA: Diagnosis not present

## 2020-11-14 DIAGNOSIS — N186 End stage renal disease: Secondary | ICD-10-CM | POA: Diagnosis not present

## 2020-11-14 DIAGNOSIS — R110 Nausea: Secondary | ICD-10-CM | POA: Diagnosis not present

## 2020-11-14 DIAGNOSIS — Z992 Dependence on renal dialysis: Secondary | ICD-10-CM | POA: Diagnosis not present

## 2020-11-15 DIAGNOSIS — N186 End stage renal disease: Secondary | ICD-10-CM | POA: Diagnosis not present

## 2020-11-15 DIAGNOSIS — Z992 Dependence on renal dialysis: Secondary | ICD-10-CM | POA: Diagnosis not present

## 2020-11-16 DIAGNOSIS — R1110 Vomiting, unspecified: Secondary | ICD-10-CM | POA: Diagnosis not present

## 2020-11-16 DIAGNOSIS — D631 Anemia in chronic kidney disease: Secondary | ICD-10-CM | POA: Diagnosis not present

## 2020-11-16 DIAGNOSIS — D509 Iron deficiency anemia, unspecified: Secondary | ICD-10-CM | POA: Diagnosis not present

## 2020-11-16 DIAGNOSIS — Z992 Dependence on renal dialysis: Secondary | ICD-10-CM | POA: Diagnosis not present

## 2020-11-16 DIAGNOSIS — N2581 Secondary hyperparathyroidism of renal origin: Secondary | ICD-10-CM | POA: Diagnosis not present

## 2020-11-16 DIAGNOSIS — N186 End stage renal disease: Secondary | ICD-10-CM | POA: Diagnosis not present

## 2020-11-16 DIAGNOSIS — R110 Nausea: Secondary | ICD-10-CM | POA: Diagnosis not present

## 2020-11-18 DIAGNOSIS — N2581 Secondary hyperparathyroidism of renal origin: Secondary | ICD-10-CM | POA: Diagnosis not present

## 2020-11-18 DIAGNOSIS — N186 End stage renal disease: Secondary | ICD-10-CM | POA: Diagnosis not present

## 2020-11-18 DIAGNOSIS — R110 Nausea: Secondary | ICD-10-CM | POA: Diagnosis not present

## 2020-11-18 DIAGNOSIS — Z992 Dependence on renal dialysis: Secondary | ICD-10-CM | POA: Diagnosis not present

## 2020-11-18 DIAGNOSIS — R1110 Vomiting, unspecified: Secondary | ICD-10-CM | POA: Diagnosis not present

## 2020-11-18 DIAGNOSIS — D509 Iron deficiency anemia, unspecified: Secondary | ICD-10-CM | POA: Diagnosis not present

## 2020-11-21 DIAGNOSIS — N186 End stage renal disease: Secondary | ICD-10-CM | POA: Diagnosis not present

## 2020-11-21 DIAGNOSIS — R110 Nausea: Secondary | ICD-10-CM | POA: Diagnosis not present

## 2020-11-21 DIAGNOSIS — D509 Iron deficiency anemia, unspecified: Secondary | ICD-10-CM | POA: Diagnosis not present

## 2020-11-21 DIAGNOSIS — Z992 Dependence on renal dialysis: Secondary | ICD-10-CM | POA: Diagnosis not present

## 2020-11-21 DIAGNOSIS — N2581 Secondary hyperparathyroidism of renal origin: Secondary | ICD-10-CM | POA: Diagnosis not present

## 2020-11-21 DIAGNOSIS — R1110 Vomiting, unspecified: Secondary | ICD-10-CM | POA: Diagnosis not present

## 2020-11-23 DIAGNOSIS — Z992 Dependence on renal dialysis: Secondary | ICD-10-CM | POA: Diagnosis not present

## 2020-11-23 DIAGNOSIS — N2581 Secondary hyperparathyroidism of renal origin: Secondary | ICD-10-CM | POA: Diagnosis not present

## 2020-11-23 DIAGNOSIS — R110 Nausea: Secondary | ICD-10-CM | POA: Diagnosis not present

## 2020-11-23 DIAGNOSIS — N186 End stage renal disease: Secondary | ICD-10-CM | POA: Diagnosis not present

## 2020-11-23 DIAGNOSIS — R1110 Vomiting, unspecified: Secondary | ICD-10-CM | POA: Diagnosis not present

## 2020-11-23 DIAGNOSIS — D509 Iron deficiency anemia, unspecified: Secondary | ICD-10-CM | POA: Diagnosis not present

## 2020-11-25 DIAGNOSIS — N186 End stage renal disease: Secondary | ICD-10-CM | POA: Diagnosis not present

## 2020-11-25 DIAGNOSIS — N2581 Secondary hyperparathyroidism of renal origin: Secondary | ICD-10-CM | POA: Diagnosis not present

## 2020-11-25 DIAGNOSIS — R110 Nausea: Secondary | ICD-10-CM | POA: Diagnosis not present

## 2020-11-25 DIAGNOSIS — Z992 Dependence on renal dialysis: Secondary | ICD-10-CM | POA: Diagnosis not present

## 2020-11-25 DIAGNOSIS — D509 Iron deficiency anemia, unspecified: Secondary | ICD-10-CM | POA: Diagnosis not present

## 2020-11-25 DIAGNOSIS — R1110 Vomiting, unspecified: Secondary | ICD-10-CM | POA: Diagnosis not present

## 2020-11-28 DIAGNOSIS — D509 Iron deficiency anemia, unspecified: Secondary | ICD-10-CM | POA: Diagnosis not present

## 2020-11-28 DIAGNOSIS — N2581 Secondary hyperparathyroidism of renal origin: Secondary | ICD-10-CM | POA: Diagnosis not present

## 2020-11-28 DIAGNOSIS — N186 End stage renal disease: Secondary | ICD-10-CM | POA: Diagnosis not present

## 2020-11-28 DIAGNOSIS — R1110 Vomiting, unspecified: Secondary | ICD-10-CM | POA: Diagnosis not present

## 2020-11-28 DIAGNOSIS — Z992 Dependence on renal dialysis: Secondary | ICD-10-CM | POA: Diagnosis not present

## 2020-11-28 DIAGNOSIS — R110 Nausea: Secondary | ICD-10-CM | POA: Diagnosis not present

## 2020-11-30 DIAGNOSIS — N186 End stage renal disease: Secondary | ICD-10-CM | POA: Diagnosis not present

## 2020-11-30 DIAGNOSIS — R1110 Vomiting, unspecified: Secondary | ICD-10-CM | POA: Diagnosis not present

## 2020-11-30 DIAGNOSIS — D509 Iron deficiency anemia, unspecified: Secondary | ICD-10-CM | POA: Diagnosis not present

## 2020-11-30 DIAGNOSIS — N2581 Secondary hyperparathyroidism of renal origin: Secondary | ICD-10-CM | POA: Diagnosis not present

## 2020-11-30 DIAGNOSIS — Z992 Dependence on renal dialysis: Secondary | ICD-10-CM | POA: Diagnosis not present

## 2020-11-30 DIAGNOSIS — R110 Nausea: Secondary | ICD-10-CM | POA: Diagnosis not present

## 2020-12-02 DIAGNOSIS — Z992 Dependence on renal dialysis: Secondary | ICD-10-CM | POA: Diagnosis not present

## 2020-12-02 DIAGNOSIS — N2581 Secondary hyperparathyroidism of renal origin: Secondary | ICD-10-CM | POA: Diagnosis not present

## 2020-12-02 DIAGNOSIS — D509 Iron deficiency anemia, unspecified: Secondary | ICD-10-CM | POA: Diagnosis not present

## 2020-12-02 DIAGNOSIS — R1110 Vomiting, unspecified: Secondary | ICD-10-CM | POA: Diagnosis not present

## 2020-12-02 DIAGNOSIS — R110 Nausea: Secondary | ICD-10-CM | POA: Diagnosis not present

## 2020-12-02 DIAGNOSIS — N186 End stage renal disease: Secondary | ICD-10-CM | POA: Diagnosis not present

## 2020-12-05 DIAGNOSIS — N2581 Secondary hyperparathyroidism of renal origin: Secondary | ICD-10-CM | POA: Diagnosis not present

## 2020-12-05 DIAGNOSIS — Z992 Dependence on renal dialysis: Secondary | ICD-10-CM | POA: Diagnosis not present

## 2020-12-05 DIAGNOSIS — R1110 Vomiting, unspecified: Secondary | ICD-10-CM | POA: Diagnosis not present

## 2020-12-05 DIAGNOSIS — N186 End stage renal disease: Secondary | ICD-10-CM | POA: Diagnosis not present

## 2020-12-05 DIAGNOSIS — D509 Iron deficiency anemia, unspecified: Secondary | ICD-10-CM | POA: Diagnosis not present

## 2020-12-05 DIAGNOSIS — R110 Nausea: Secondary | ICD-10-CM | POA: Diagnosis not present

## 2020-12-07 DIAGNOSIS — N2581 Secondary hyperparathyroidism of renal origin: Secondary | ICD-10-CM | POA: Diagnosis not present

## 2020-12-07 DIAGNOSIS — R110 Nausea: Secondary | ICD-10-CM | POA: Diagnosis not present

## 2020-12-07 DIAGNOSIS — D509 Iron deficiency anemia, unspecified: Secondary | ICD-10-CM | POA: Diagnosis not present

## 2020-12-07 DIAGNOSIS — Z992 Dependence on renal dialysis: Secondary | ICD-10-CM | POA: Diagnosis not present

## 2020-12-07 DIAGNOSIS — R1110 Vomiting, unspecified: Secondary | ICD-10-CM | POA: Diagnosis not present

## 2020-12-07 DIAGNOSIS — N186 End stage renal disease: Secondary | ICD-10-CM | POA: Diagnosis not present

## 2020-12-09 DIAGNOSIS — R1110 Vomiting, unspecified: Secondary | ICD-10-CM | POA: Diagnosis not present

## 2020-12-09 DIAGNOSIS — N2581 Secondary hyperparathyroidism of renal origin: Secondary | ICD-10-CM | POA: Diagnosis not present

## 2020-12-09 DIAGNOSIS — D509 Iron deficiency anemia, unspecified: Secondary | ICD-10-CM | POA: Diagnosis not present

## 2020-12-09 DIAGNOSIS — Z992 Dependence on renal dialysis: Secondary | ICD-10-CM | POA: Diagnosis not present

## 2020-12-09 DIAGNOSIS — N186 End stage renal disease: Secondary | ICD-10-CM | POA: Diagnosis not present

## 2020-12-09 DIAGNOSIS — R110 Nausea: Secondary | ICD-10-CM | POA: Diagnosis not present

## 2020-12-12 ENCOUNTER — Telehealth: Payer: Self-pay | Admitting: Internal Medicine

## 2020-12-12 DIAGNOSIS — Z992 Dependence on renal dialysis: Secondary | ICD-10-CM | POA: Diagnosis not present

## 2020-12-12 DIAGNOSIS — N2581 Secondary hyperparathyroidism of renal origin: Secondary | ICD-10-CM | POA: Diagnosis not present

## 2020-12-12 DIAGNOSIS — R110 Nausea: Secondary | ICD-10-CM | POA: Diagnosis not present

## 2020-12-12 DIAGNOSIS — R1110 Vomiting, unspecified: Secondary | ICD-10-CM | POA: Diagnosis not present

## 2020-12-12 DIAGNOSIS — N186 End stage renal disease: Secondary | ICD-10-CM | POA: Diagnosis not present

## 2020-12-12 DIAGNOSIS — D509 Iron deficiency anemia, unspecified: Secondary | ICD-10-CM | POA: Diagnosis not present

## 2020-12-12 NOTE — Telephone Encounter (Signed)
Patient called and said that she turned in paperwork to get her linzess and she has not heard from the,  She is out of her medication .

## 2020-12-14 DIAGNOSIS — N2581 Secondary hyperparathyroidism of renal origin: Secondary | ICD-10-CM | POA: Diagnosis not present

## 2020-12-14 DIAGNOSIS — R110 Nausea: Secondary | ICD-10-CM | POA: Diagnosis not present

## 2020-12-14 DIAGNOSIS — Z992 Dependence on renal dialysis: Secondary | ICD-10-CM | POA: Diagnosis not present

## 2020-12-14 DIAGNOSIS — N186 End stage renal disease: Secondary | ICD-10-CM | POA: Diagnosis not present

## 2020-12-14 DIAGNOSIS — D509 Iron deficiency anemia, unspecified: Secondary | ICD-10-CM | POA: Diagnosis not present

## 2020-12-14 DIAGNOSIS — R1110 Vomiting, unspecified: Secondary | ICD-10-CM | POA: Diagnosis not present

## 2020-12-14 NOTE — Telephone Encounter (Signed)
Lmom, waiting on a return call.  

## 2020-12-14 NOTE — Telephone Encounter (Signed)
Spoke with pt. Linzess 290 mcg samples are ready for pickup. I will call pt assistance today to see if pt can get a refill

## 2020-12-15 NOTE — Telephone Encounter (Signed)
Tried calling pt assistance company to check the status of pts application and waited on hold for over 20 mins. Will call back.

## 2020-12-16 DIAGNOSIS — N185 Chronic kidney disease, stage 5: Secondary | ICD-10-CM | POA: Diagnosis not present

## 2020-12-16 DIAGNOSIS — Z23 Encounter for immunization: Secondary | ICD-10-CM | POA: Diagnosis not present

## 2020-12-16 DIAGNOSIS — N2581 Secondary hyperparathyroidism of renal origin: Secondary | ICD-10-CM | POA: Diagnosis not present

## 2020-12-16 DIAGNOSIS — E7849 Other hyperlipidemia: Secondary | ICD-10-CM | POA: Diagnosis not present

## 2020-12-16 DIAGNOSIS — N186 End stage renal disease: Secondary | ICD-10-CM | POA: Diagnosis not present

## 2020-12-16 DIAGNOSIS — Z992 Dependence on renal dialysis: Secondary | ICD-10-CM | POA: Diagnosis not present

## 2020-12-16 DIAGNOSIS — R110 Nausea: Secondary | ICD-10-CM | POA: Diagnosis not present

## 2020-12-16 DIAGNOSIS — I10 Essential (primary) hypertension: Secondary | ICD-10-CM | POA: Diagnosis not present

## 2020-12-16 DIAGNOSIS — N184 Chronic kidney disease, stage 4 (severe): Secondary | ICD-10-CM | POA: Diagnosis not present

## 2020-12-16 DIAGNOSIS — R1110 Vomiting, unspecified: Secondary | ICD-10-CM | POA: Diagnosis not present

## 2020-12-16 DIAGNOSIS — D509 Iron deficiency anemia, unspecified: Secondary | ICD-10-CM | POA: Diagnosis not present

## 2020-12-17 DIAGNOSIS — D631 Anemia in chronic kidney disease: Secondary | ICD-10-CM | POA: Diagnosis not present

## 2020-12-17 DIAGNOSIS — D509 Iron deficiency anemia, unspecified: Secondary | ICD-10-CM | POA: Diagnosis not present

## 2020-12-17 DIAGNOSIS — E8779 Other fluid overload: Secondary | ICD-10-CM | POA: Diagnosis not present

## 2020-12-17 DIAGNOSIS — Z992 Dependence on renal dialysis: Secondary | ICD-10-CM | POA: Diagnosis not present

## 2020-12-17 DIAGNOSIS — N2581 Secondary hyperparathyroidism of renal origin: Secondary | ICD-10-CM | POA: Diagnosis not present

## 2020-12-17 DIAGNOSIS — N186 End stage renal disease: Secondary | ICD-10-CM | POA: Diagnosis not present

## 2020-12-19 DIAGNOSIS — N186 End stage renal disease: Secondary | ICD-10-CM | POA: Diagnosis not present

## 2020-12-19 DIAGNOSIS — N2581 Secondary hyperparathyroidism of renal origin: Secondary | ICD-10-CM | POA: Diagnosis not present

## 2020-12-19 DIAGNOSIS — D509 Iron deficiency anemia, unspecified: Secondary | ICD-10-CM | POA: Diagnosis not present

## 2020-12-19 DIAGNOSIS — Z992 Dependence on renal dialysis: Secondary | ICD-10-CM | POA: Diagnosis not present

## 2020-12-19 DIAGNOSIS — E8779 Other fluid overload: Secondary | ICD-10-CM | POA: Diagnosis not present

## 2020-12-19 DIAGNOSIS — D631 Anemia in chronic kidney disease: Secondary | ICD-10-CM | POA: Diagnosis not present

## 2020-12-20 ENCOUNTER — Ambulatory Visit: Payer: Medicare Other | Admitting: Orthopaedic Surgery

## 2020-12-20 ENCOUNTER — Ambulatory Visit (INDEPENDENT_AMBULATORY_CARE_PROVIDER_SITE_OTHER): Payer: Medicare Other | Admitting: Orthopaedic Surgery

## 2020-12-20 ENCOUNTER — Ambulatory Visit (INDEPENDENT_AMBULATORY_CARE_PROVIDER_SITE_OTHER): Payer: Medicare Other

## 2020-12-20 ENCOUNTER — Encounter: Payer: Self-pay | Admitting: Orthopaedic Surgery

## 2020-12-20 VITALS — BP 184/99 | HR 111 | Ht 63.0 in | Wt 245.0 lb

## 2020-12-20 DIAGNOSIS — M79671 Pain in right foot: Secondary | ICD-10-CM

## 2020-12-20 DIAGNOSIS — N186 End stage renal disease: Secondary | ICD-10-CM | POA: Diagnosis not present

## 2020-12-20 DIAGNOSIS — M25512 Pain in left shoulder: Secondary | ICD-10-CM

## 2020-12-20 DIAGNOSIS — S93601A Unspecified sprain of right foot, initial encounter: Secondary | ICD-10-CM | POA: Diagnosis not present

## 2020-12-20 DIAGNOSIS — S40012A Contusion of left shoulder, initial encounter: Secondary | ICD-10-CM

## 2020-12-20 DIAGNOSIS — D631 Anemia in chronic kidney disease: Secondary | ICD-10-CM | POA: Diagnosis not present

## 2020-12-20 DIAGNOSIS — Z992 Dependence on renal dialysis: Secondary | ICD-10-CM | POA: Diagnosis not present

## 2020-12-20 DIAGNOSIS — M542 Cervicalgia: Secondary | ICD-10-CM

## 2020-12-20 DIAGNOSIS — D509 Iron deficiency anemia, unspecified: Secondary | ICD-10-CM | POA: Diagnosis not present

## 2020-12-20 DIAGNOSIS — E8779 Other fluid overload: Secondary | ICD-10-CM | POA: Diagnosis not present

## 2020-12-20 DIAGNOSIS — N2581 Secondary hyperparathyroidism of renal origin: Secondary | ICD-10-CM | POA: Diagnosis not present

## 2020-12-20 NOTE — Progress Notes (Signed)
Office Visit Note   Patient: Patricia [name]           Date of Birth: 1971/05/22           MRN: [id number] Visit Date: 12/20/2020              Requested by: Patricia Samples, PA-C [address] [place],  [place] [address] PCP: Patricia Samples, PA-C   Assessment & Plan: Visit Diagnoses:  1. Neck pain   2. Acute pain of left shoulder   3. Pain in right foot   4. Sprain of right foot, initial encounter   5. Contusion of left shoulder, initial encounter     Plan: She will continue ice as she has been doing post fall.  She not able take anti-inflammatories due to history of acute kidney failure.  Recheck on as-needed basis.  Follow-Up Instructions: No follow-ups on file.   Orders:  Orders Placed This Encounter  Procedures  . XR Cervical Spine 2 or 3 views  . XR Shoulder Left  . XR Foot Complete Right   No orders of the defined types were placed in this encounter.     Procedures: No procedures performed   Clinical Data: No additional findings.   Subjective: Chief Complaint  Patient presents with  . Left Shoulder - Pain    Fall 12/03/2020  . Neck - Pain    Fall 12/03/2020  . Right Foot - Pain    Fall 12/03/2020    HPI 50 year old female dialysis patient was coming down the steps in a restaurant missed the last 2 steps fell rolling her right foot and landing on her left shoulder.  Date of injury was 12/03/2020 she has not had x-rays.  She noted her foot turned black and blue with pain over the great toe with swelling.  No history of gout.  She is here for x-rays of her ankle and left shoulder.  Patient denies loss of consciousness.  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  Review of Systems chronic dialysis patient.  Previous trimalleolar ankle fracture right ankle.  All other systems noncontributory.   Objective: Vital Signs: BP (!) 184/99   Pulse (!) 111   Ht 5\' 3"  (1.6 m)   Wt 245 lb (111.1 kg)   BMI 43.40 kg/m   Physical Exam Constitutional:       Appearance: She is well-developed.  HENT:     Head: Normocephalic.     Right Ear: External ear normal.     Left Ear: External ear normal.  Eyes:     Pupils: Pupils are equal, round, and reactive to light.  Neck:     Thyroid: No thyromegaly.     Trachea: No tracheal deviation.  Cardiovascular:     Rate and Rhythm: Normal rate.  Pulmonary:     Effort: Pulmonary effort is normal.  Abdominal:     Palpations: Abdomen is soft.  Skin:    General: Skin is warm and dry.  Neurological:     Mental Status: She is alert and oriented to person, place, and time.  Psychiatric:        Mood and Affect: Mood and affect normal.        Behavior: Behavior normal.     Ortho Exam patient has the tenderness of the first MTP joint with swelling.  Skin is intact no plantar foot lesions.  Healed medial lateral incisions as well as anterior from trimalleolar ankle fixation.  No effusion of the ankle.  Left shoulder  shows tenderness laterally over the deltoid region.  Internal/external rotation and resisted abduction is intact.  Chromic clavicular joint minimally tender on the left.  Specialty Comments:  No specialty comments available.  Imaging: XR Cervical Spine 2 or 3 views  Result Date: 12/20/2020 AP lateral cervical spine x-rays are obtained and reviewed.  This shows mild anterior spurring at C5-6 with maintenance of disc space height no spondylolisthesis.  Negative for acute fracture post fall. Impression: Cervical spine images demonstrate mild mid cervical spurring.  XR Foot Complete Right  Result Date: 12/20/2020 Three-view x-rays right foot obtained and reviewed this shows some calcification arteries in the foot unchanged from previous images.  No acute injury is noted. Impression: Right foot negative x-rays for acute fracture post fall.  XR Shoulder Left  Result Date: 12/20/2020 Three-view x-rays left shoulder obtained.  Negative for acute fracture post fall.  Glenohumeral joint is reduced  acromioclavicular joint is normal. Impression: Normal left shoulder x-rays.    PMFS History: Patient Active Problem List   Diagnosis Date Noted  . Right foot sprain 12/20/2020  . Contusion of left shoulder 12/20/2020  . Syncope 08/03/2020  . Acute pulmonary edema ([place]) 01/11/2020  . OSA (obstructive sleep apnea)   . Closed right ankle fracture 10/06/2018  . Closed displaced trimalleolar fracture of right ankle 10/06/2018  . Acute colitis 02/26/2018  . Hypoxemia 02/24/2018  . Dyspnea 11/26/2017  . Anemia 11/26/2017  . Elevated troponin 11/26/2017  . Hyperkalemia 09/29/2017  . Acute respiratory failure ([place]) 09/28/2017  . CAP (community acquired pneumonia) 09/26/2017  . Volume overload 09/25/2017  . Acute on chronic diastolic CHF (congestive heart failure) ([place]) 09/25/2017  . Lactic acidosis 09/25/2017  . Hypokalemia 09/25/2017  . Breast cancer ([place]) 07/22/2017  . SOB (shortness of breath)   . Acute respiratory failure with hypoxia ([place]) 07/21/2017  . Flatulence 10/25/2016  . GERD (gastroesophageal reflux disease) 08/02/2016  . Pyloric stenosis, acquired   . Nausea with vomiting   . Generalized abdominal pain   . Chest pain, rule out acute myocardial infarction 07/04/2016  . Constipation 07/04/2016  . Nausea 07/04/2016  . Essential hypertension 07/04/2016  . HCAP (healthcare-associated pneumonia) 12/04/2015  . End stage renal disease on dialysis ([place]) 12/04/2015  . Type 2 diabetes mellitus ([place]) 12/04/2015   Past Medical History:  Diagnosis Date  . Anemia   . Ankle fracture   . Arthritis   . Blood transfusion without reported diagnosis   . Breast cancer ([place])   . Cancer ([place])   . Diabetes mellitus without complication ([place])   . Dialysis patient ([place])    mon, wed, friday,   . End stage renal disease on dialysis [facility])    M/W/F [facility] in [place]  . GERD (gastroesophageal reflux disease)   . Hypertension   . OSA (obstructive sleep apnea)    uses CPAP sometimes   . Pneumonia   . PONV (postoperative nausea and vomiting)   . Wears glasses     Family History  Problem Relation Age of Onset  . Diabetes Mellitus II Mother   . Hypertension Mother   . Hypertension Sister   . Hypertension Sister   . Colon cancer Neg Hx     Past Surgical History:  Procedure Laterality Date  . ABDOMINAL HYSTERECTOMY    . AV FISTULA PLACEMENT  11/2014   at [place] N/A 07/10/2016   Procedure: BALLOON DILATION;  Surgeon: Danie Binder, MD;  [facility];  Service:  Endoscopy;  Laterality: N/A;  Pyloric dilation  . BREAST LUMPECTOMY    . CESAREAN SECTION    . CHOLECYSTECTOMY    . COLONOSCOPY WITH PROPOFOL N/A 09/27/2016   Dr. Gala Romney: Internal hemorrhoids repeat colonoscopy in 10 years  . DILATION AND CURETTAGE OF UTERUS    . ESOPHAGOGASTRODUODENOSCOPY N/A 07/10/2016   Dr.Fields- normal esophagus, gastric stenosis was found at the pylorus, gastritis on bx, normal examined duodenun  . EXTERNAL FIXATION REMOVAL Right 10/29/2018   Procedure: REMOVAL RIGHT ANKLE BIOMET ZIMMER EXTERNAL FIXATOR, SHORT LEG CAST APPLICATION;  Surgeon: Marybelle Killings, MD;  Location: [place];  Service: Orthopedics;  Laterality: Right;  . MASTECTOMY     left sided  . ORIF ANKLE FRACTURE Right 10/06/2018   Procedure: OPEN REDUCTION INTERNAL FIXATION (ORIF) RIGHT ANKLE TRIMALLEOLAR;  Surgeon: Marybelle Killings, MD;  Location: [facility];  Service: Orthopedics;  Laterality: Right;   Social History   Occupational History  . Not on file  Tobacco Use  . Smoking status: Never Smoker  . Smokeless tobacco: Never Used  Vaping Use  . Vaping Use: Never used  Substance and Sexual Activity  . Alcohol use: No    Alcohol/week: 0.0 standard drinks  . Drug use: No  . Sexual activity: Not on file

## 2020-12-21 ENCOUNTER — Telehealth: Payer: Self-pay | Admitting: Gastroenterology

## 2020-12-21 DIAGNOSIS — Z992 Dependence on renal dialysis: Secondary | ICD-10-CM | POA: Diagnosis not present

## 2020-12-21 DIAGNOSIS — N186 End stage renal disease: Secondary | ICD-10-CM | POA: Diagnosis not present

## 2020-12-21 DIAGNOSIS — D509 Iron deficiency anemia, unspecified: Secondary | ICD-10-CM | POA: Diagnosis not present

## 2020-12-21 DIAGNOSIS — D631 Anemia in chronic kidney disease: Secondary | ICD-10-CM | POA: Diagnosis not present

## 2020-12-21 DIAGNOSIS — N2581 Secondary hyperparathyroidism of renal origin: Secondary | ICD-10-CM | POA: Diagnosis not present

## 2020-12-21 DIAGNOSIS — E8779 Other fluid overload: Secondary | ICD-10-CM | POA: Diagnosis not present

## 2020-12-21 NOTE — Telephone Encounter (Signed)
[phone] PATIENT CALLED AND SAID SHE FINALLY GOT IN TOUCH WITH [employer] AND THEY TOLD HER HER APPLICATION FOR THE YEAR HAD NOT BEEN SUBMITTED YET    SHE IS OUT OF MEDICATIONS

## 2020-12-21 NOTE — Telephone Encounter (Signed)
Spoke with pt. Pt is aware that her application was faxed to pt assistance company. I'll call pt when samples of Linzess 290 are available and will call pt assistance to check on pts application.

## 2020-12-23 DIAGNOSIS — E8779 Other fluid overload: Secondary | ICD-10-CM | POA: Diagnosis not present

## 2020-12-23 DIAGNOSIS — N186 End stage renal disease: Secondary | ICD-10-CM | POA: Diagnosis not present

## 2020-12-23 DIAGNOSIS — D509 Iron deficiency anemia, unspecified: Secondary | ICD-10-CM | POA: Diagnosis not present

## 2020-12-23 DIAGNOSIS — D631 Anemia in chronic kidney disease: Secondary | ICD-10-CM | POA: Diagnosis not present

## 2020-12-23 DIAGNOSIS — N2581 Secondary hyperparathyroidism of renal origin: Secondary | ICD-10-CM | POA: Diagnosis not present

## 2020-12-23 DIAGNOSIS — Z992 Dependence on renal dialysis: Secondary | ICD-10-CM | POA: Diagnosis not present

## 2020-12-26 DIAGNOSIS — N2581 Secondary hyperparathyroidism of renal origin: Secondary | ICD-10-CM | POA: Diagnosis not present

## 2020-12-26 DIAGNOSIS — Z992 Dependence on renal dialysis: Secondary | ICD-10-CM | POA: Diagnosis not present

## 2020-12-26 DIAGNOSIS — D631 Anemia in chronic kidney disease: Secondary | ICD-10-CM | POA: Diagnosis not present

## 2020-12-26 DIAGNOSIS — E8779 Other fluid overload: Secondary | ICD-10-CM | POA: Diagnosis not present

## 2020-12-26 DIAGNOSIS — N186 End stage renal disease: Secondary | ICD-10-CM | POA: Diagnosis not present

## 2020-12-26 DIAGNOSIS — D509 Iron deficiency anemia, unspecified: Secondary | ICD-10-CM | POA: Diagnosis not present

## 2020-12-28 DIAGNOSIS — N186 End stage renal disease: Secondary | ICD-10-CM | POA: Diagnosis not present

## 2020-12-28 DIAGNOSIS — E8779 Other fluid overload: Secondary | ICD-10-CM | POA: Diagnosis not present

## 2020-12-28 DIAGNOSIS — D509 Iron deficiency anemia, unspecified: Secondary | ICD-10-CM | POA: Diagnosis not present

## 2020-12-28 DIAGNOSIS — N2581 Secondary hyperparathyroidism of renal origin: Secondary | ICD-10-CM | POA: Diagnosis not present

## 2020-12-28 DIAGNOSIS — Z992 Dependence on renal dialysis: Secondary | ICD-10-CM | POA: Diagnosis not present

## 2020-12-28 DIAGNOSIS — D631 Anemia in chronic kidney disease: Secondary | ICD-10-CM | POA: Diagnosis not present

## 2020-12-30 DIAGNOSIS — D509 Iron deficiency anemia, unspecified: Secondary | ICD-10-CM | POA: Diagnosis not present

## 2020-12-30 DIAGNOSIS — N2581 Secondary hyperparathyroidism of renal origin: Secondary | ICD-10-CM | POA: Diagnosis not present

## 2020-12-30 DIAGNOSIS — N186 End stage renal disease: Secondary | ICD-10-CM | POA: Diagnosis not present

## 2020-12-30 DIAGNOSIS — Z992 Dependence on renal dialysis: Secondary | ICD-10-CM | POA: Diagnosis not present

## 2020-12-30 DIAGNOSIS — E8779 Other fluid overload: Secondary | ICD-10-CM | POA: Diagnosis not present

## 2020-12-30 DIAGNOSIS — D631 Anemia in chronic kidney disease: Secondary | ICD-10-CM | POA: Diagnosis not present

## 2021-01-02 DIAGNOSIS — Z992 Dependence on renal dialysis: Secondary | ICD-10-CM | POA: Diagnosis not present

## 2021-01-02 DIAGNOSIS — E8779 Other fluid overload: Secondary | ICD-10-CM | POA: Diagnosis not present

## 2021-01-02 DIAGNOSIS — D509 Iron deficiency anemia, unspecified: Secondary | ICD-10-CM | POA: Diagnosis not present

## 2021-01-02 DIAGNOSIS — N186 End stage renal disease: Secondary | ICD-10-CM | POA: Diagnosis not present

## 2021-01-02 DIAGNOSIS — N2581 Secondary hyperparathyroidism of renal origin: Secondary | ICD-10-CM | POA: Diagnosis not present

## 2021-01-02 DIAGNOSIS — D631 Anemia in chronic kidney disease: Secondary | ICD-10-CM | POA: Diagnosis not present

## 2021-01-04 DIAGNOSIS — D631 Anemia in chronic kidney disease: Secondary | ICD-10-CM | POA: Diagnosis not present

## 2021-01-04 DIAGNOSIS — Z992 Dependence on renal dialysis: Secondary | ICD-10-CM | POA: Diagnosis not present

## 2021-01-04 DIAGNOSIS — E8779 Other fluid overload: Secondary | ICD-10-CM | POA: Diagnosis not present

## 2021-01-04 DIAGNOSIS — D509 Iron deficiency anemia, unspecified: Secondary | ICD-10-CM | POA: Diagnosis not present

## 2021-01-04 DIAGNOSIS — N186 End stage renal disease: Secondary | ICD-10-CM | POA: Diagnosis not present

## 2021-01-04 DIAGNOSIS — N2581 Secondary hyperparathyroidism of renal origin: Secondary | ICD-10-CM | POA: Diagnosis not present

## 2021-01-06 DIAGNOSIS — N186 End stage renal disease: Secondary | ICD-10-CM | POA: Diagnosis not present

## 2021-01-06 DIAGNOSIS — E8779 Other fluid overload: Secondary | ICD-10-CM | POA: Diagnosis not present

## 2021-01-06 DIAGNOSIS — D509 Iron deficiency anemia, unspecified: Secondary | ICD-10-CM | POA: Diagnosis not present

## 2021-01-06 DIAGNOSIS — N2581 Secondary hyperparathyroidism of renal origin: Secondary | ICD-10-CM | POA: Diagnosis not present

## 2021-01-06 DIAGNOSIS — Z992 Dependence on renal dialysis: Secondary | ICD-10-CM | POA: Diagnosis not present

## 2021-01-06 DIAGNOSIS — D631 Anemia in chronic kidney disease: Secondary | ICD-10-CM | POA: Diagnosis not present

## 2021-01-09 DIAGNOSIS — D509 Iron deficiency anemia, unspecified: Secondary | ICD-10-CM | POA: Diagnosis not present

## 2021-01-09 DIAGNOSIS — E8779 Other fluid overload: Secondary | ICD-10-CM | POA: Diagnosis not present

## 2021-01-09 DIAGNOSIS — N2581 Secondary hyperparathyroidism of renal origin: Secondary | ICD-10-CM | POA: Diagnosis not present

## 2021-01-09 DIAGNOSIS — Z992 Dependence on renal dialysis: Secondary | ICD-10-CM | POA: Diagnosis not present

## 2021-01-09 DIAGNOSIS — D631 Anemia in chronic kidney disease: Secondary | ICD-10-CM | POA: Diagnosis not present

## 2021-01-09 DIAGNOSIS — N186 End stage renal disease: Secondary | ICD-10-CM | POA: Diagnosis not present

## 2021-01-10 NOTE — Telephone Encounter (Signed)
Received the [facility] [employer] Patient Assistance letter.  Linzess 290 mcg has been approved as of 01/05/21. Mychart message was sent to pt. Pt will be notified when medication arrives. Approval letter sent for scanning.

## 2021-01-11 DIAGNOSIS — D509 Iron deficiency anemia, unspecified: Secondary | ICD-10-CM | POA: Diagnosis not present

## 2021-01-11 DIAGNOSIS — D631 Anemia in chronic kidney disease: Secondary | ICD-10-CM | POA: Diagnosis not present

## 2021-01-11 DIAGNOSIS — Z992 Dependence on renal dialysis: Secondary | ICD-10-CM | POA: Diagnosis not present

## 2021-01-11 DIAGNOSIS — E8779 Other fluid overload: Secondary | ICD-10-CM | POA: Diagnosis not present

## 2021-01-11 DIAGNOSIS — N2581 Secondary hyperparathyroidism of renal origin: Secondary | ICD-10-CM | POA: Diagnosis not present

## 2021-01-11 DIAGNOSIS — N186 End stage renal disease: Secondary | ICD-10-CM | POA: Diagnosis not present

## 2021-01-13 DIAGNOSIS — E8779 Other fluid overload: Secondary | ICD-10-CM | POA: Diagnosis not present

## 2021-01-13 DIAGNOSIS — N186 End stage renal disease: Secondary | ICD-10-CM | POA: Diagnosis not present

## 2021-01-13 DIAGNOSIS — Z992 Dependence on renal dialysis: Secondary | ICD-10-CM | POA: Diagnosis not present

## 2021-01-13 DIAGNOSIS — N2581 Secondary hyperparathyroidism of renal origin: Secondary | ICD-10-CM | POA: Diagnosis not present

## 2021-01-13 DIAGNOSIS — D509 Iron deficiency anemia, unspecified: Secondary | ICD-10-CM | POA: Diagnosis not present

## 2021-01-13 DIAGNOSIS — D631 Anemia in chronic kidney disease: Secondary | ICD-10-CM | POA: Diagnosis not present

## 2021-01-14 DIAGNOSIS — N184 Chronic kidney disease, stage 4 (severe): Secondary | ICD-10-CM | POA: Diagnosis not present

## 2021-01-14 DIAGNOSIS — I10 Essential (primary) hypertension: Secondary | ICD-10-CM | POA: Diagnosis not present

## 2021-01-14 DIAGNOSIS — E6609 Other obesity due to excess calories: Secondary | ICD-10-CM | POA: Diagnosis not present

## 2021-01-14 DIAGNOSIS — E782 Mixed hyperlipidemia: Secondary | ICD-10-CM | POA: Diagnosis not present

## 2021-01-14 DIAGNOSIS — N185 Chronic kidney disease, stage 5: Secondary | ICD-10-CM | POA: Diagnosis not present

## 2021-01-16 DIAGNOSIS — D631 Anemia in chronic kidney disease: Secondary | ICD-10-CM | POA: Diagnosis not present

## 2021-01-16 DIAGNOSIS — D509 Iron deficiency anemia, unspecified: Secondary | ICD-10-CM | POA: Diagnosis not present

## 2021-01-16 DIAGNOSIS — N186 End stage renal disease: Secondary | ICD-10-CM | POA: Diagnosis not present

## 2021-01-16 DIAGNOSIS — Z992 Dependence on renal dialysis: Secondary | ICD-10-CM | POA: Diagnosis not present

## 2021-01-16 DIAGNOSIS — N2581 Secondary hyperparathyroidism of renal origin: Secondary | ICD-10-CM | POA: Diagnosis not present

## 2021-01-16 DIAGNOSIS — E8779 Other fluid overload: Secondary | ICD-10-CM | POA: Diagnosis not present

## 2021-01-17 DIAGNOSIS — N2581 Secondary hyperparathyroidism of renal origin: Secondary | ICD-10-CM | POA: Diagnosis not present

## 2021-01-17 DIAGNOSIS — R110 Nausea: Secondary | ICD-10-CM | POA: Diagnosis not present

## 2021-01-17 DIAGNOSIS — R1110 Vomiting, unspecified: Secondary | ICD-10-CM | POA: Diagnosis not present

## 2021-01-17 DIAGNOSIS — N186 End stage renal disease: Secondary | ICD-10-CM | POA: Diagnosis not present

## 2021-01-17 DIAGNOSIS — D631 Anemia in chronic kidney disease: Secondary | ICD-10-CM | POA: Diagnosis not present

## 2021-01-17 DIAGNOSIS — D509 Iron deficiency anemia, unspecified: Secondary | ICD-10-CM | POA: Diagnosis not present

## 2021-01-17 DIAGNOSIS — Z992 Dependence on renal dialysis: Secondary | ICD-10-CM | POA: Diagnosis not present

## 2021-01-18 DIAGNOSIS — R110 Nausea: Secondary | ICD-10-CM | POA: Diagnosis not present

## 2021-01-18 DIAGNOSIS — R1110 Vomiting, unspecified: Secondary | ICD-10-CM | POA: Diagnosis not present

## 2021-01-18 DIAGNOSIS — N2581 Secondary hyperparathyroidism of renal origin: Secondary | ICD-10-CM | POA: Diagnosis not present

## 2021-01-18 DIAGNOSIS — N186 End stage renal disease: Secondary | ICD-10-CM | POA: Diagnosis not present

## 2021-01-18 DIAGNOSIS — D509 Iron deficiency anemia, unspecified: Secondary | ICD-10-CM | POA: Diagnosis not present

## 2021-01-18 DIAGNOSIS — Z992 Dependence on renal dialysis: Secondary | ICD-10-CM | POA: Diagnosis not present

## 2021-01-20 DIAGNOSIS — N2581 Secondary hyperparathyroidism of renal origin: Secondary | ICD-10-CM | POA: Diagnosis not present

## 2021-01-20 DIAGNOSIS — R110 Nausea: Secondary | ICD-10-CM | POA: Diagnosis not present

## 2021-01-20 DIAGNOSIS — D509 Iron deficiency anemia, unspecified: Secondary | ICD-10-CM | POA: Diagnosis not present

## 2021-01-20 DIAGNOSIS — R1110 Vomiting, unspecified: Secondary | ICD-10-CM | POA: Diagnosis not present

## 2021-01-20 DIAGNOSIS — Z992 Dependence on renal dialysis: Secondary | ICD-10-CM | POA: Diagnosis not present

## 2021-01-20 DIAGNOSIS — N186 End stage renal disease: Secondary | ICD-10-CM | POA: Diagnosis not present

## 2021-01-23 DIAGNOSIS — N186 End stage renal disease: Secondary | ICD-10-CM | POA: Diagnosis not present

## 2021-01-23 DIAGNOSIS — Z992 Dependence on renal dialysis: Secondary | ICD-10-CM | POA: Diagnosis not present

## 2021-01-23 DIAGNOSIS — D509 Iron deficiency anemia, unspecified: Secondary | ICD-10-CM | POA: Diagnosis not present

## 2021-01-23 DIAGNOSIS — R1110 Vomiting, unspecified: Secondary | ICD-10-CM | POA: Diagnosis not present

## 2021-01-23 DIAGNOSIS — N2581 Secondary hyperparathyroidism of renal origin: Secondary | ICD-10-CM | POA: Diagnosis not present

## 2021-01-23 DIAGNOSIS — R110 Nausea: Secondary | ICD-10-CM | POA: Diagnosis not present

## 2021-01-25 DIAGNOSIS — D509 Iron deficiency anemia, unspecified: Secondary | ICD-10-CM | POA: Diagnosis not present

## 2021-01-25 DIAGNOSIS — Z992 Dependence on renal dialysis: Secondary | ICD-10-CM | POA: Diagnosis not present

## 2021-01-25 DIAGNOSIS — N186 End stage renal disease: Secondary | ICD-10-CM | POA: Diagnosis not present

## 2021-01-25 DIAGNOSIS — R110 Nausea: Secondary | ICD-10-CM | POA: Diagnosis not present

## 2021-01-25 DIAGNOSIS — R1110 Vomiting, unspecified: Secondary | ICD-10-CM | POA: Diagnosis not present

## 2021-01-25 DIAGNOSIS — N2581 Secondary hyperparathyroidism of renal origin: Secondary | ICD-10-CM | POA: Diagnosis not present

## 2021-01-27 DIAGNOSIS — N2581 Secondary hyperparathyroidism of renal origin: Secondary | ICD-10-CM | POA: Diagnosis not present

## 2021-01-27 DIAGNOSIS — R1110 Vomiting, unspecified: Secondary | ICD-10-CM | POA: Diagnosis not present

## 2021-01-27 DIAGNOSIS — Z992 Dependence on renal dialysis: Secondary | ICD-10-CM | POA: Diagnosis not present

## 2021-01-27 DIAGNOSIS — R110 Nausea: Secondary | ICD-10-CM | POA: Diagnosis not present

## 2021-01-27 DIAGNOSIS — N186 End stage renal disease: Secondary | ICD-10-CM | POA: Diagnosis not present

## 2021-01-27 DIAGNOSIS — D509 Iron deficiency anemia, unspecified: Secondary | ICD-10-CM | POA: Diagnosis not present

## 2021-01-30 DIAGNOSIS — R1110 Vomiting, unspecified: Secondary | ICD-10-CM | POA: Diagnosis not present

## 2021-01-30 DIAGNOSIS — R110 Nausea: Secondary | ICD-10-CM | POA: Diagnosis not present

## 2021-01-30 DIAGNOSIS — Z992 Dependence on renal dialysis: Secondary | ICD-10-CM | POA: Diagnosis not present

## 2021-01-30 DIAGNOSIS — N186 End stage renal disease: Secondary | ICD-10-CM | POA: Diagnosis not present

## 2021-01-30 DIAGNOSIS — D509 Iron deficiency anemia, unspecified: Secondary | ICD-10-CM | POA: Diagnosis not present

## 2021-01-30 DIAGNOSIS — N2581 Secondary hyperparathyroidism of renal origin: Secondary | ICD-10-CM | POA: Diagnosis not present

## 2021-01-31 DIAGNOSIS — E119 Type 2 diabetes mellitus without complications: Secondary | ICD-10-CM | POA: Diagnosis not present

## 2021-01-31 DIAGNOSIS — Z992 Dependence on renal dialysis: Secondary | ICD-10-CM | POA: Diagnosis not present

## 2021-01-31 DIAGNOSIS — T82898A Other specified complication of vascular prosthetic devices, implants and grafts, initial encounter: Secondary | ICD-10-CM | POA: Diagnosis not present

## 2021-01-31 DIAGNOSIS — I10 Essential (primary) hypertension: Secondary | ICD-10-CM | POA: Diagnosis not present

## 2021-01-31 DIAGNOSIS — N186 End stage renal disease: Secondary | ICD-10-CM | POA: Diagnosis not present

## 2021-01-31 DIAGNOSIS — I120 Hypertensive chronic kidney disease with stage 5 chronic kidney disease or end stage renal disease: Secondary | ICD-10-CM | POA: Diagnosis not present

## 2021-01-31 DIAGNOSIS — E1122 Type 2 diabetes mellitus with diabetic chronic kidney disease: Secondary | ICD-10-CM | POA: Diagnosis not present

## 2021-02-01 DIAGNOSIS — Z992 Dependence on renal dialysis: Secondary | ICD-10-CM | POA: Diagnosis not present

## 2021-02-01 DIAGNOSIS — D509 Iron deficiency anemia, unspecified: Secondary | ICD-10-CM | POA: Diagnosis not present

## 2021-02-01 DIAGNOSIS — N2581 Secondary hyperparathyroidism of renal origin: Secondary | ICD-10-CM | POA: Diagnosis not present

## 2021-02-01 DIAGNOSIS — N186 End stage renal disease: Secondary | ICD-10-CM | POA: Diagnosis not present

## 2021-02-01 DIAGNOSIS — R110 Nausea: Secondary | ICD-10-CM | POA: Diagnosis not present

## 2021-02-01 DIAGNOSIS — R1110 Vomiting, unspecified: Secondary | ICD-10-CM | POA: Diagnosis not present

## 2021-02-03 DIAGNOSIS — D509 Iron deficiency anemia, unspecified: Secondary | ICD-10-CM | POA: Diagnosis not present

## 2021-02-03 DIAGNOSIS — Z992 Dependence on renal dialysis: Secondary | ICD-10-CM | POA: Diagnosis not present

## 2021-02-03 DIAGNOSIS — N186 End stage renal disease: Secondary | ICD-10-CM | POA: Diagnosis not present

## 2021-02-03 DIAGNOSIS — N2581 Secondary hyperparathyroidism of renal origin: Secondary | ICD-10-CM | POA: Diagnosis not present

## 2021-02-03 DIAGNOSIS — R1110 Vomiting, unspecified: Secondary | ICD-10-CM | POA: Diagnosis not present

## 2021-02-03 DIAGNOSIS — R110 Nausea: Secondary | ICD-10-CM | POA: Diagnosis not present

## 2021-02-06 DIAGNOSIS — T82858A Stenosis of vascular prosthetic devices, implants and grafts, initial encounter: Secondary | ICD-10-CM | POA: Diagnosis not present

## 2021-02-06 DIAGNOSIS — Z992 Dependence on renal dialysis: Secondary | ICD-10-CM | POA: Diagnosis not present

## 2021-02-06 DIAGNOSIS — R1110 Vomiting, unspecified: Secondary | ICD-10-CM | POA: Diagnosis not present

## 2021-02-06 DIAGNOSIS — D509 Iron deficiency anemia, unspecified: Secondary | ICD-10-CM | POA: Diagnosis not present

## 2021-02-06 DIAGNOSIS — R110 Nausea: Secondary | ICD-10-CM | POA: Diagnosis not present

## 2021-02-06 DIAGNOSIS — T82590A Other mechanical complication of surgically created arteriovenous fistula, initial encounter: Secondary | ICD-10-CM | POA: Diagnosis not present

## 2021-02-06 DIAGNOSIS — N2581 Secondary hyperparathyroidism of renal origin: Secondary | ICD-10-CM | POA: Diagnosis not present

## 2021-02-06 DIAGNOSIS — N186 End stage renal disease: Secondary | ICD-10-CM | POA: Diagnosis not present

## 2021-02-06 DIAGNOSIS — T82898A Other specified complication of vascular prosthetic devices, implants and grafts, initial encounter: Secondary | ICD-10-CM | POA: Diagnosis not present

## 2021-02-08 DIAGNOSIS — R1110 Vomiting, unspecified: Secondary | ICD-10-CM | POA: Diagnosis not present

## 2021-02-08 DIAGNOSIS — Z992 Dependence on renal dialysis: Secondary | ICD-10-CM | POA: Diagnosis not present

## 2021-02-08 DIAGNOSIS — R110 Nausea: Secondary | ICD-10-CM | POA: Diagnosis not present

## 2021-02-08 DIAGNOSIS — N2581 Secondary hyperparathyroidism of renal origin: Secondary | ICD-10-CM | POA: Diagnosis not present

## 2021-02-08 DIAGNOSIS — D509 Iron deficiency anemia, unspecified: Secondary | ICD-10-CM | POA: Diagnosis not present

## 2021-02-08 DIAGNOSIS — N186 End stage renal disease: Secondary | ICD-10-CM | POA: Diagnosis not present

## 2021-02-10 DIAGNOSIS — N2581 Secondary hyperparathyroidism of renal origin: Secondary | ICD-10-CM | POA: Diagnosis not present

## 2021-02-10 DIAGNOSIS — R1110 Vomiting, unspecified: Secondary | ICD-10-CM | POA: Diagnosis not present

## 2021-02-10 DIAGNOSIS — R110 Nausea: Secondary | ICD-10-CM | POA: Diagnosis not present

## 2021-02-10 DIAGNOSIS — Z992 Dependence on renal dialysis: Secondary | ICD-10-CM | POA: Diagnosis not present

## 2021-02-10 DIAGNOSIS — N186 End stage renal disease: Secondary | ICD-10-CM | POA: Diagnosis not present

## 2021-02-10 DIAGNOSIS — D509 Iron deficiency anemia, unspecified: Secondary | ICD-10-CM | POA: Diagnosis not present

## 2021-02-13 DIAGNOSIS — Z992 Dependence on renal dialysis: Secondary | ICD-10-CM | POA: Diagnosis not present

## 2021-02-13 DIAGNOSIS — N184 Chronic kidney disease, stage 4 (severe): Secondary | ICD-10-CM | POA: Diagnosis not present

## 2021-02-13 DIAGNOSIS — N186 End stage renal disease: Secondary | ICD-10-CM | POA: Diagnosis not present

## 2021-02-13 DIAGNOSIS — R110 Nausea: Secondary | ICD-10-CM | POA: Diagnosis not present

## 2021-02-13 DIAGNOSIS — D509 Iron deficiency anemia, unspecified: Secondary | ICD-10-CM | POA: Diagnosis not present

## 2021-02-13 DIAGNOSIS — I10 Essential (primary) hypertension: Secondary | ICD-10-CM | POA: Diagnosis not present

## 2021-02-13 DIAGNOSIS — N185 Chronic kidney disease, stage 5: Secondary | ICD-10-CM | POA: Diagnosis not present

## 2021-02-13 DIAGNOSIS — N2581 Secondary hyperparathyroidism of renal origin: Secondary | ICD-10-CM | POA: Diagnosis not present

## 2021-02-13 DIAGNOSIS — E782 Mixed hyperlipidemia: Secondary | ICD-10-CM | POA: Diagnosis not present

## 2021-02-13 DIAGNOSIS — E6609 Other obesity due to excess calories: Secondary | ICD-10-CM | POA: Diagnosis not present

## 2021-02-13 DIAGNOSIS — R1110 Vomiting, unspecified: Secondary | ICD-10-CM | POA: Diagnosis not present

## 2021-02-14 DIAGNOSIS — R110 Nausea: Secondary | ICD-10-CM | POA: Diagnosis not present

## 2021-02-14 DIAGNOSIS — D631 Anemia in chronic kidney disease: Secondary | ICD-10-CM | POA: Diagnosis not present

## 2021-02-14 DIAGNOSIS — N186 End stage renal disease: Secondary | ICD-10-CM | POA: Diagnosis not present

## 2021-02-14 DIAGNOSIS — D509 Iron deficiency anemia, unspecified: Secondary | ICD-10-CM | POA: Diagnosis not present

## 2021-02-14 DIAGNOSIS — R1110 Vomiting, unspecified: Secondary | ICD-10-CM | POA: Diagnosis not present

## 2021-02-14 DIAGNOSIS — Z992 Dependence on renal dialysis: Secondary | ICD-10-CM | POA: Diagnosis not present

## 2021-02-14 DIAGNOSIS — N2581 Secondary hyperparathyroidism of renal origin: Secondary | ICD-10-CM | POA: Diagnosis not present

## 2021-02-15 DIAGNOSIS — R1110 Vomiting, unspecified: Secondary | ICD-10-CM | POA: Diagnosis not present

## 2021-02-15 DIAGNOSIS — Z992 Dependence on renal dialysis: Secondary | ICD-10-CM | POA: Diagnosis not present

## 2021-02-15 DIAGNOSIS — R110 Nausea: Secondary | ICD-10-CM | POA: Diagnosis not present

## 2021-02-15 DIAGNOSIS — D509 Iron deficiency anemia, unspecified: Secondary | ICD-10-CM | POA: Diagnosis not present

## 2021-02-15 DIAGNOSIS — N2581 Secondary hyperparathyroidism of renal origin: Secondary | ICD-10-CM | POA: Diagnosis not present

## 2021-02-15 DIAGNOSIS — N186 End stage renal disease: Secondary | ICD-10-CM | POA: Diagnosis not present

## 2021-02-16 ENCOUNTER — Encounter: Payer: Self-pay | Admitting: Gastroenterology

## 2021-02-16 ENCOUNTER — Other Ambulatory Visit: Payer: Self-pay

## 2021-02-16 ENCOUNTER — Ambulatory Visit (INDEPENDENT_AMBULATORY_CARE_PROVIDER_SITE_OTHER): Payer: Medicare Other | Admitting: Gastroenterology

## 2021-02-16 VITALS — BP 190/89 | HR 96 | Temp 97.0°F | Ht 63.0 in | Wt 254.0 lb

## 2021-02-16 DIAGNOSIS — K5900 Constipation, unspecified: Secondary | ICD-10-CM

## 2021-02-16 DIAGNOSIS — K219 Gastro-esophageal reflux disease without esophagitis: Secondary | ICD-10-CM | POA: Diagnosis not present

## 2021-02-16 MED ORDER — PANTOPRAZOLE SODIUM 40 MG PO TBEC
40.0000 mg | DELAYED_RELEASE_TABLET | Freq: Every day | ORAL | 3 refills | Status: DC
Start: 2021-02-16 — End: 2021-12-25

## 2021-02-16 NOTE — Patient Instructions (Addendum)
Continue Linzess daily.   Incorporate the high fiber foods in your diet, and eat the salads like you talked about to help with regularity!  I have refilled Protonix for you!  We will see you in 6 months!   I enjoyed seeing you again today! As you know, I value our relationship and want to provide genuine, compassionate, and quality care. I welcome your feedback. If you receive a survey regarding your visit,  I greatly appreciate you taking time to fill this out. See you next time!  Annitta Needs, PhD, ANP-BC [facility]   High-Fiber Eating Plan Fiber, also called dietary fiber, is a type of carbohydrate. It is found foods such as fruits, vegetables, whole grains, and beans. A high-fiber diet can have many health benefits. Your health care provider may recommend a high-fiber diet to help:  Prevent constipation. Fiber can make your bowel movements more regular.  Lower your cholesterol.  Relieve the following conditions: ? Inflammation of veins in the anus (hemorrhoids). ? Inflammation of specific areas of the digestive tract (uncomplicated diverticulosis). ? A problem of the large intestine, also called the colon, that sometimes causes pain and diarrhea (irritable bowel syndrome, or IBS).  Prevent overeating as part of a weight-loss plan.  Prevent heart disease, type 2 diabetes, and certain cancers. What are tips for following this plan? Reading food labels  Check the nutrition facts label on food products for the amount of dietary fiber. Choose foods that have 5 grams of fiber or more per serving.  The goals for recommended daily fiber intake include: ? Men (age 44 or younger): 34-38 g. ? Men (over age 66): 28-34 g. ? Women (age 33 or younger): 25-28 g. ? Women (over age 35): 22-25 g. Your daily fiber goal is _____________ g.   Shopping  Choose whole fruits and vegetables instead of processed forms, such as apple juice or applesauce.  Choose a wide variety  of high-fiber foods such as avocados, lentils, oats, and kidney beans.  Read the nutrition facts label of the foods you choose. Be aware of foods with added fiber. These foods often have high sugar and sodium amounts per serving. Cooking  Use whole-grain flour for baking and cooking.  Cook with brown rice instead of white rice. Meal planning  Start the day with a breakfast that is high in fiber, such as a cereal that contains 5 g of fiber or more per serving.  Eat breads and cereals that are made with whole-grain flour instead of refined flour or white flour.  Eat brown rice, bulgur wheat, or millet instead of white rice.  Use beans in place of meat in soups, salads, and pasta dishes.  Be sure that half of the grains you eat each day are whole grains. General information  You can get the recommended daily intake of dietary fiber by: ? Eating a variety of fruits, vegetables, grains, nuts, and beans. ? Taking a fiber supplement if you are not able to take in enough fiber in your diet. It is better to get fiber through food than from a supplement.  Gradually increase how much fiber you consume. If you increase your intake of dietary fiber too quickly, you may have bloating, cramping, or gas.  Drink plenty of water to help you digest fiber.  Choose high-fiber snacks, such as berries, raw vegetables, nuts, and popcorn. What foods should I eat? Fruits Berries. Pears. Apples. Oranges. Avocado. Prunes and raisins. Dried figs. Vegetables Sweet potatoes. Spinach. Kale.  Artichokes. Cabbage. Broccoli. Cauliflower. Green peas. Carrots. Squash. Grains Whole-grain breads. Multigrain cereal. Oats and oatmeal. Brown rice. Barley. Bulgur wheat. [place]. Quinoa. Bran muffins. Popcorn. Rye wafer crackers. Meats and other proteins Navy beans, kidney beans, and pinto beans. Soybeans. Split peas. Lentils. Nuts and seeds. Dairy Fiber-fortified yogurt. Beverages Fiber-fortified soy milk.  Fiber-fortified orange juice. Other foods Fiber bars. The items listed above may not be a complete list of recommended foods and beverages. Contact a dietitian for more information. What foods should I avoid? Fruits Fruit juice. Cooked, strained fruit. Vegetables Fried potatoes. Canned vegetables. Well-cooked vegetables. Grains White bread. Pasta made with refined flour. White rice. Meats and other proteins Fatty cuts of meat. Fried chicken or fried fish. Dairy Milk. Yogurt. Cream cheese. Sour cream. Fats and oils Butters. Beverages Soft drinks. Other foods Cakes and pastries. The items listed above may not be a complete list of foods and beverages to avoid. Talk with your dietitian about what choices are best for you. Summary  Fiber is a type of carbohydrate. It is found in foods such as fruits, vegetables, whole grains, and beans.  A high-fiber diet has many benefits. It can help to prevent constipation, lower blood cholesterol, aid weight loss, and reduce your risk of heart disease, diabetes, and certain cancers.  Increase your intake of fiber gradually. Increasing fiber too quickly may cause cramping, bloating, and gas. Drink plenty of water while you increase the amount of fiber you consume.  The best sources of fiber include whole fruits and vegetables, whole grains, nuts, seeds, and beans. This information is not intended to replace advice given to you by your health care provider. Make sure you discuss any questions you have with your health care provider. Document Revised: 04/07/2020 Document Reviewed: 04/07/2020 Elsevier Patient Education  2021 [employer].

## 2021-02-16 NOTE — Progress Notes (Signed)
Referring Provider: Jake Samples, PA* Primary Care Physician:  Jake Samples, PA-C Primary GI: Dr. Abbey Chatters  Chief Complaint  Patient presents with  . Constipation    Sometimes the medication works fine and then can go few days without BM    HPI:   Patricia [name] is a 50 y.o. female presenting today with a history of constipation that has been difficult to manage, previously trying Trulance, Amitiza, and Linzess. Linzess has worked best and now patient assistance has been obtained. Chronic GERD. Miralax doesn't work well for patient. On dialysis. On fluid restriction.  Will have gas build up and stomach cramps. Will take dicyclomine one pill. Very rare. Doesn't take often as knows this can contribute to constipation. On dialysis days will take Linzess later in the day. Salads help her to go to bathroom. Stills will have days she goes without a BM. Had a fast in January with her church but modified it. Eating smaller portions now purposefully. Takes stool softeners every other day.   Past Medical History:  Diagnosis Date  . Anemia   . Ankle fracture   . Arthritis   . Blood transfusion without reported diagnosis   . Breast cancer ([place])   . Cancer ([place])   . Diabetes mellitus without complication ([place])   . Dialysis patient ([place])    mon, wed, friday,   . End stage renal disease on dialysis [facility])    M/W/F [facility] in [place]  . GERD (gastroesophageal reflux disease)   . Hypertension   . OSA (obstructive sleep apnea)    uses CPAP sometimes  . Pneumonia   . PONV (postoperative nausea and vomiting)   . Wears glasses     Past Surgical History:  Procedure Laterality Date  . ABDOMINAL HYSTERECTOMY    . AV FISTULA PLACEMENT  11/2014   at [place] N/A 07/10/2016   Procedure: BALLOON DILATION;  Surgeon: Danie Binder, MD;  [facility];  Service: Endoscopy;  Laterality: N/A;  Pyloric dilation  . BREAST LUMPECTOMY    . CESAREAN SECTION     . CHOLECYSTECTOMY    . COLONOSCOPY WITH PROPOFOL N/A 09/27/2016   Dr. Gala Romney: Internal hemorrhoids repeat colonoscopy in 10 years  . DILATION AND CURETTAGE OF UTERUS    . ESOPHAGOGASTRODUODENOSCOPY N/A 07/10/2016   Dr.Fields- normal esophagus, gastric stenosis was found at the pylorus, gastritis on bx, normal examined duodenun  . EXTERNAL FIXATION REMOVAL Right 10/29/2018   Procedure: REMOVAL RIGHT ANKLE BIOMET ZIMMER EXTERNAL FIXATOR, SHORT LEG CAST APPLICATION;  Surgeon: Marybelle Killings, MD;  Location: [place];  Service: Orthopedics;  Laterality: Right;  . MASTECTOMY     left sided  . ORIF ANKLE FRACTURE Right 10/06/2018   Procedure: OPEN REDUCTION INTERNAL FIXATION (ORIF) RIGHT ANKLE TRIMALLEOLAR;  Surgeon: Marybelle Killings, MD;  Location: [place];  Service: Orthopedics;  Laterality: Right;    Current Outpatient Medications  Medication Sig Dispense Refill  . albuterol (PROVENTIL HFA;VENTOLIN HFA) 108 (90 Base) MCG/ACT inhaler Inhale 2 puffs into the lungs every 6 (six) hours as needed for wheezing or shortness of breath. 1 Inhaler 2  . albuterol (PROVENTIL) (2.5 MG/3ML) 0.083% nebulizer solution Take 3 mLs (2.5 mg total) by nebulization every 6 (six) hours as needed for wheezing or shortness of breath. 75 mL 12  . bisacodyl (DULCOLAX) 5 MG EC tablet Take 5 mg by mouth daily as needed.    . docusate sodium (COLACE) 100 MG capsule  Take 100 mg by mouth daily as needed.    . ferric citrate (AURYXIA) 1 GM 210 MG(Fe) tablet Take 2 tablets by mouth 2 (two) times daily with a meal. With Breakfast & with supper    . FLOVENT HFA 110 MCG/ACT inhaler 1 puff 2 (two) times daily.    . furosemide (LASIX) 40 MG tablet Take 40 mg by mouth 3 (three) times daily.    Marland Kitchen glipiZIDE (GLUCOTROL) 5 MG tablet Take 5 mg by mouth 2 (two) times daily as needed. For blood sugar levels over 150    . ipratropium (ATROVENT) 0.03 % nasal spray Place 2 sprays into both nostrils 2 (two) times daily.     Marland Kitchen linaclotide (LINZESS) 290  MCG CAPS capsule Take 290 mcg by mouth daily before breakfast.    . multivitamin (RENA-VIT) TABS tablet Take 1 tablet by mouth daily.    Marland Kitchen NIFEdipine (ADALAT CC) 60 MG 24 hr tablet Take 1 tablet (60 mg total) by mouth every evening.    . ondansetron (ZOFRAN-ODT) 4 MG disintegrating tablet Take 4 mg by mouth as needed for nausea or vomiting.     . pantoprazole (PROTONIX) 40 MG tablet Take 1 tablet (40 mg total) by mouth daily. 30 minutes before breakfast 90 tablet 3  . rOPINIRole (REQUIP XL) 2 MG 24 hr tablet Take 2 mg by mouth at bedtime.     . sevelamer carbonate (RENVELA) 800 MG tablet Take 800 mg by mouth See admin instructions. Take 2 tablets (1600 mg) in the morning, take 3 tablets (2400 mg) with lunch or snack,  and take 2 tablets (1600 mg) by mouth in the evening with dinner meal.    . sodium chloride (OCEAN) 0.65 % SOLN nasal spray Place 1 spray into both nostrils as needed for congestion. (Patient taking differently: Place 1 spray into both nostrils daily as needed for congestion.) 60 mL 0  . polyethylene glycol (MIRALAX) 17 g packet Take 17 g by mouth daily as needed for mild constipation. (Patient not taking: Reported on 02/16/2021)     No current facility-administered medications for this visit.    Allergies as of 02/16/2021 - Review Complete 02/16/2021  Allergen Reaction Noted  . Amlodipine besylate Rash and Other (See Comments) 12/04/2015  . Reglan [metoclopramide] Other (See Comments) 12/04/2015    Family History  Problem Relation Age of Onset  . Diabetes Mellitus II Mother   . Hypertension Mother   . Hypertension Sister   . Hypertension Sister   . Colon cancer Neg Hx     Social History   Socioeconomic History  . Marital status: Widowed    Spouse name: Not on file  . Number of children: Not on file  . Years of education: Not on file  . Highest education level: Not on file  Occupational History  . Not on file  Tobacco Use  . Smoking status: Never Smoker  .  Smokeless tobacco: Never Used  Vaping Use  . Vaping Use: Never used  Substance and Sexual Activity  . Alcohol use: No    Alcohol/week: 0.0 standard drinks  . Drug use: No  . Sexual activity: Not on file  Other Topics Concern  . Not on file  Social History Narrative  . Not on file   Social Determinants of Health   Financial Resource Strain: Not on file  Food Insecurity: Not on file  Transportation Needs: Not on file  Physical Activity: Not on file  Stress: Not on file  Social  Connections: Not on file    Review of Systems: Gen: Denies fever, chills, anorexia. Denies fatigue, weakness, weight loss.  CV: Denies chest pain, palpitations, syncope, peripheral edema, and claudication. Resp: Denies dyspnea at rest, cough, wheezing, coughing up blood, and pleurisy. GI: see HPI Derm: Denies rash, itching, dry skin Psych: Denies depression, anxiety, memory loss, confusion. No homicidal or suicidal ideation.  Heme: Denies bruising, bleeding, and enlarged lymph nodes.  Physical Exam: BP (!) 190/89   Pulse 96   Temp (!) 97 F (36.1 C)   Ht 5\' 3"  (1.6 m)   Wt 254 lb (115.2 kg)   BMI 44.99 kg/m  General:   Alert and oriented. No distress noted. Pleasant and cooperative.  Head:  Normocephalic and atraumatic. Eyes:  Conjuctiva clear without scleral icterus. Mouth:  Mask in place Abdomen:  +BS, soft, non-tender and non-distended. No rebound or guarding. No HSM or masses noted. Msk:  Symmetrical without gross deformities. Normal posture. Extremities:  Without edema. Neurologic:  Alert and  oriented x4 Psych:  Alert and cooperative. Normal mood and affect.  ASSESSMENT: Patricia [name] is a 50 y.o. female presenting today with history of constipation, GERD, for routine follow-up.  Difficult managing constipation historically due to insurance coverage. Linzess has worked best for patient, and thankfully this is now approved with assistance. Due to fluid restriction, supplemental fiber has  not been appropriate. She has noted improvement with more fibrous foods in her diet (salads).   GERD: continue Protonix. Controlled.   PLAN:  Continue Linzess 290 mcg daily High fiber diet Protonix daily Return in 4-6 months   Annitta Needs, PhD, [facility] Gastroenterology

## 2021-02-17 DIAGNOSIS — D509 Iron deficiency anemia, unspecified: Secondary | ICD-10-CM | POA: Diagnosis not present

## 2021-02-17 DIAGNOSIS — N186 End stage renal disease: Secondary | ICD-10-CM | POA: Diagnosis not present

## 2021-02-17 DIAGNOSIS — R110 Nausea: Secondary | ICD-10-CM | POA: Diagnosis not present

## 2021-02-17 DIAGNOSIS — Z992 Dependence on renal dialysis: Secondary | ICD-10-CM | POA: Diagnosis not present

## 2021-02-17 DIAGNOSIS — R1110 Vomiting, unspecified: Secondary | ICD-10-CM | POA: Diagnosis not present

## 2021-02-17 DIAGNOSIS — N2581 Secondary hyperparathyroidism of renal origin: Secondary | ICD-10-CM | POA: Diagnosis not present

## 2021-02-20 DIAGNOSIS — D509 Iron deficiency anemia, unspecified: Secondary | ICD-10-CM | POA: Diagnosis not present

## 2021-02-20 DIAGNOSIS — N2581 Secondary hyperparathyroidism of renal origin: Secondary | ICD-10-CM | POA: Diagnosis not present

## 2021-02-20 DIAGNOSIS — N186 End stage renal disease: Secondary | ICD-10-CM | POA: Diagnosis not present

## 2021-02-20 DIAGNOSIS — Z992 Dependence on renal dialysis: Secondary | ICD-10-CM | POA: Diagnosis not present

## 2021-02-20 DIAGNOSIS — R110 Nausea: Secondary | ICD-10-CM | POA: Diagnosis not present

## 2021-02-20 DIAGNOSIS — R1110 Vomiting, unspecified: Secondary | ICD-10-CM | POA: Diagnosis not present

## 2021-02-22 DIAGNOSIS — R1110 Vomiting, unspecified: Secondary | ICD-10-CM | POA: Diagnosis not present

## 2021-02-22 DIAGNOSIS — D509 Iron deficiency anemia, unspecified: Secondary | ICD-10-CM | POA: Diagnosis not present

## 2021-02-22 DIAGNOSIS — N186 End stage renal disease: Secondary | ICD-10-CM | POA: Diagnosis not present

## 2021-02-22 DIAGNOSIS — Z992 Dependence on renal dialysis: Secondary | ICD-10-CM | POA: Diagnosis not present

## 2021-02-22 DIAGNOSIS — R110 Nausea: Secondary | ICD-10-CM | POA: Diagnosis not present

## 2021-02-22 DIAGNOSIS — N2581 Secondary hyperparathyroidism of renal origin: Secondary | ICD-10-CM | POA: Diagnosis not present

## 2021-02-24 DIAGNOSIS — Z992 Dependence on renal dialysis: Secondary | ICD-10-CM | POA: Diagnosis not present

## 2021-02-24 DIAGNOSIS — N2581 Secondary hyperparathyroidism of renal origin: Secondary | ICD-10-CM | POA: Diagnosis not present

## 2021-02-24 DIAGNOSIS — N186 End stage renal disease: Secondary | ICD-10-CM | POA: Diagnosis not present

## 2021-02-24 DIAGNOSIS — R110 Nausea: Secondary | ICD-10-CM | POA: Diagnosis not present

## 2021-02-24 DIAGNOSIS — R1110 Vomiting, unspecified: Secondary | ICD-10-CM | POA: Diagnosis not present

## 2021-02-24 DIAGNOSIS — D509 Iron deficiency anemia, unspecified: Secondary | ICD-10-CM | POA: Diagnosis not present

## 2021-02-26 DIAGNOSIS — D509 Iron deficiency anemia, unspecified: Secondary | ICD-10-CM | POA: Diagnosis not present

## 2021-02-26 DIAGNOSIS — Z992 Dependence on renal dialysis: Secondary | ICD-10-CM | POA: Diagnosis not present

## 2021-02-26 DIAGNOSIS — N186 End stage renal disease: Secondary | ICD-10-CM | POA: Diagnosis not present

## 2021-02-26 DIAGNOSIS — R1110 Vomiting, unspecified: Secondary | ICD-10-CM | POA: Diagnosis not present

## 2021-02-26 DIAGNOSIS — R110 Nausea: Secondary | ICD-10-CM | POA: Diagnosis not present

## 2021-02-26 DIAGNOSIS — N2581 Secondary hyperparathyroidism of renal origin: Secondary | ICD-10-CM | POA: Diagnosis not present

## 2021-02-27 DIAGNOSIS — N186 End stage renal disease: Secondary | ICD-10-CM | POA: Diagnosis not present

## 2021-02-27 DIAGNOSIS — Z992 Dependence on renal dialysis: Secondary | ICD-10-CM | POA: Diagnosis not present

## 2021-02-27 DIAGNOSIS — R1110 Vomiting, unspecified: Secondary | ICD-10-CM | POA: Diagnosis not present

## 2021-02-27 DIAGNOSIS — R110 Nausea: Secondary | ICD-10-CM | POA: Diagnosis not present

## 2021-02-27 DIAGNOSIS — N2581 Secondary hyperparathyroidism of renal origin: Secondary | ICD-10-CM | POA: Diagnosis not present

## 2021-02-27 DIAGNOSIS — D509 Iron deficiency anemia, unspecified: Secondary | ICD-10-CM | POA: Diagnosis not present

## 2021-03-01 DIAGNOSIS — R110 Nausea: Secondary | ICD-10-CM | POA: Diagnosis not present

## 2021-03-01 DIAGNOSIS — N186 End stage renal disease: Secondary | ICD-10-CM | POA: Diagnosis not present

## 2021-03-01 DIAGNOSIS — R1110 Vomiting, unspecified: Secondary | ICD-10-CM | POA: Diagnosis not present

## 2021-03-01 DIAGNOSIS — Z992 Dependence on renal dialysis: Secondary | ICD-10-CM | POA: Diagnosis not present

## 2021-03-01 DIAGNOSIS — D509 Iron deficiency anemia, unspecified: Secondary | ICD-10-CM | POA: Diagnosis not present

## 2021-03-01 DIAGNOSIS — N2581 Secondary hyperparathyroidism of renal origin: Secondary | ICD-10-CM | POA: Diagnosis not present

## 2021-03-03 DIAGNOSIS — R110 Nausea: Secondary | ICD-10-CM | POA: Diagnosis not present

## 2021-03-03 DIAGNOSIS — R1110 Vomiting, unspecified: Secondary | ICD-10-CM | POA: Diagnosis not present

## 2021-03-03 DIAGNOSIS — N2581 Secondary hyperparathyroidism of renal origin: Secondary | ICD-10-CM | POA: Diagnosis not present

## 2021-03-03 DIAGNOSIS — Z992 Dependence on renal dialysis: Secondary | ICD-10-CM | POA: Diagnosis not present

## 2021-03-03 DIAGNOSIS — D509 Iron deficiency anemia, unspecified: Secondary | ICD-10-CM | POA: Diagnosis not present

## 2021-03-03 DIAGNOSIS — N186 End stage renal disease: Secondary | ICD-10-CM | POA: Diagnosis not present

## 2021-03-06 DIAGNOSIS — R110 Nausea: Secondary | ICD-10-CM | POA: Diagnosis not present

## 2021-03-06 DIAGNOSIS — D509 Iron deficiency anemia, unspecified: Secondary | ICD-10-CM | POA: Diagnosis not present

## 2021-03-06 DIAGNOSIS — N2581 Secondary hyperparathyroidism of renal origin: Secondary | ICD-10-CM | POA: Diagnosis not present

## 2021-03-06 DIAGNOSIS — Z992 Dependence on renal dialysis: Secondary | ICD-10-CM | POA: Diagnosis not present

## 2021-03-06 DIAGNOSIS — N186 End stage renal disease: Secondary | ICD-10-CM | POA: Diagnosis not present

## 2021-03-06 DIAGNOSIS — R1110 Vomiting, unspecified: Secondary | ICD-10-CM | POA: Diagnosis not present

## 2021-03-08 DIAGNOSIS — Z992 Dependence on renal dialysis: Secondary | ICD-10-CM | POA: Diagnosis not present

## 2021-03-08 DIAGNOSIS — D509 Iron deficiency anemia, unspecified: Secondary | ICD-10-CM | POA: Diagnosis not present

## 2021-03-08 DIAGNOSIS — R1110 Vomiting, unspecified: Secondary | ICD-10-CM | POA: Diagnosis not present

## 2021-03-08 DIAGNOSIS — R110 Nausea: Secondary | ICD-10-CM | POA: Diagnosis not present

## 2021-03-08 DIAGNOSIS — N2581 Secondary hyperparathyroidism of renal origin: Secondary | ICD-10-CM | POA: Diagnosis not present

## 2021-03-08 DIAGNOSIS — N186 End stage renal disease: Secondary | ICD-10-CM | POA: Diagnosis not present

## 2021-03-10 DIAGNOSIS — D509 Iron deficiency anemia, unspecified: Secondary | ICD-10-CM | POA: Diagnosis not present

## 2021-03-10 DIAGNOSIS — N2581 Secondary hyperparathyroidism of renal origin: Secondary | ICD-10-CM | POA: Diagnosis not present

## 2021-03-10 DIAGNOSIS — R1110 Vomiting, unspecified: Secondary | ICD-10-CM | POA: Diagnosis not present

## 2021-03-10 DIAGNOSIS — Z992 Dependence on renal dialysis: Secondary | ICD-10-CM | POA: Diagnosis not present

## 2021-03-10 DIAGNOSIS — N186 End stage renal disease: Secondary | ICD-10-CM | POA: Diagnosis not present

## 2021-03-10 DIAGNOSIS — R110 Nausea: Secondary | ICD-10-CM | POA: Diagnosis not present

## 2021-03-13 DIAGNOSIS — R110 Nausea: Secondary | ICD-10-CM | POA: Diagnosis not present

## 2021-03-13 DIAGNOSIS — Z992 Dependence on renal dialysis: Secondary | ICD-10-CM | POA: Diagnosis not present

## 2021-03-13 DIAGNOSIS — R1110 Vomiting, unspecified: Secondary | ICD-10-CM | POA: Diagnosis not present

## 2021-03-13 DIAGNOSIS — D509 Iron deficiency anemia, unspecified: Secondary | ICD-10-CM | POA: Diagnosis not present

## 2021-03-13 DIAGNOSIS — N186 End stage renal disease: Secondary | ICD-10-CM | POA: Diagnosis not present

## 2021-03-13 DIAGNOSIS — N2581 Secondary hyperparathyroidism of renal origin: Secondary | ICD-10-CM | POA: Diagnosis not present

## 2021-03-15 DIAGNOSIS — D509 Iron deficiency anemia, unspecified: Secondary | ICD-10-CM | POA: Diagnosis not present

## 2021-03-15 DIAGNOSIS — R1110 Vomiting, unspecified: Secondary | ICD-10-CM | POA: Diagnosis not present

## 2021-03-15 DIAGNOSIS — R110 Nausea: Secondary | ICD-10-CM | POA: Diagnosis not present

## 2021-03-15 DIAGNOSIS — N2581 Secondary hyperparathyroidism of renal origin: Secondary | ICD-10-CM | POA: Diagnosis not present

## 2021-03-15 DIAGNOSIS — N186 End stage renal disease: Secondary | ICD-10-CM | POA: Diagnosis not present

## 2021-03-15 DIAGNOSIS — Z992 Dependence on renal dialysis: Secondary | ICD-10-CM | POA: Diagnosis not present

## 2021-03-15 DIAGNOSIS — E7849 Other hyperlipidemia: Secondary | ICD-10-CM | POA: Diagnosis not present

## 2021-03-15 DIAGNOSIS — E1129 Type 2 diabetes mellitus with other diabetic kidney complication: Secondary | ICD-10-CM | POA: Diagnosis not present

## 2021-03-15 DIAGNOSIS — I10 Essential (primary) hypertension: Secondary | ICD-10-CM | POA: Diagnosis not present

## 2021-03-16 DIAGNOSIS — Z992 Dependence on renal dialysis: Secondary | ICD-10-CM | POA: Diagnosis not present

## 2021-03-16 DIAGNOSIS — N186 End stage renal disease: Secondary | ICD-10-CM | POA: Diagnosis not present

## 2021-03-17 DIAGNOSIS — N186 End stage renal disease: Secondary | ICD-10-CM | POA: Diagnosis not present

## 2021-03-17 DIAGNOSIS — N2581 Secondary hyperparathyroidism of renal origin: Secondary | ICD-10-CM | POA: Diagnosis not present

## 2021-03-17 DIAGNOSIS — Z992 Dependence on renal dialysis: Secondary | ICD-10-CM | POA: Diagnosis not present

## 2021-03-17 DIAGNOSIS — D631 Anemia in chronic kidney disease: Secondary | ICD-10-CM | POA: Diagnosis not present

## 2021-03-17 DIAGNOSIS — D509 Iron deficiency anemia, unspecified: Secondary | ICD-10-CM | POA: Diagnosis not present

## 2021-03-20 DIAGNOSIS — D509 Iron deficiency anemia, unspecified: Secondary | ICD-10-CM | POA: Diagnosis not present

## 2021-03-20 DIAGNOSIS — Z992 Dependence on renal dialysis: Secondary | ICD-10-CM | POA: Diagnosis not present

## 2021-03-20 DIAGNOSIS — N2581 Secondary hyperparathyroidism of renal origin: Secondary | ICD-10-CM | POA: Diagnosis not present

## 2021-03-20 DIAGNOSIS — N186 End stage renal disease: Secondary | ICD-10-CM | POA: Diagnosis not present

## 2021-03-20 DIAGNOSIS — D631 Anemia in chronic kidney disease: Secondary | ICD-10-CM | POA: Diagnosis not present

## 2021-03-22 DIAGNOSIS — N2581 Secondary hyperparathyroidism of renal origin: Secondary | ICD-10-CM | POA: Diagnosis not present

## 2021-03-22 DIAGNOSIS — N186 End stage renal disease: Secondary | ICD-10-CM | POA: Diagnosis not present

## 2021-03-22 DIAGNOSIS — D631 Anemia in chronic kidney disease: Secondary | ICD-10-CM | POA: Diagnosis not present

## 2021-03-22 DIAGNOSIS — D509 Iron deficiency anemia, unspecified: Secondary | ICD-10-CM | POA: Diagnosis not present

## 2021-03-22 DIAGNOSIS — Z992 Dependence on renal dialysis: Secondary | ICD-10-CM | POA: Diagnosis not present

## 2021-03-24 DIAGNOSIS — D509 Iron deficiency anemia, unspecified: Secondary | ICD-10-CM | POA: Diagnosis not present

## 2021-03-24 DIAGNOSIS — N2581 Secondary hyperparathyroidism of renal origin: Secondary | ICD-10-CM | POA: Diagnosis not present

## 2021-03-24 DIAGNOSIS — N186 End stage renal disease: Secondary | ICD-10-CM | POA: Diagnosis not present

## 2021-03-24 DIAGNOSIS — D631 Anemia in chronic kidney disease: Secondary | ICD-10-CM | POA: Diagnosis not present

## 2021-03-24 DIAGNOSIS — Z992 Dependence on renal dialysis: Secondary | ICD-10-CM | POA: Diagnosis not present

## 2021-03-27 DIAGNOSIS — N2581 Secondary hyperparathyroidism of renal origin: Secondary | ICD-10-CM | POA: Diagnosis not present

## 2021-03-27 DIAGNOSIS — N186 End stage renal disease: Secondary | ICD-10-CM | POA: Diagnosis not present

## 2021-03-27 DIAGNOSIS — D631 Anemia in chronic kidney disease: Secondary | ICD-10-CM | POA: Diagnosis not present

## 2021-03-27 DIAGNOSIS — D509 Iron deficiency anemia, unspecified: Secondary | ICD-10-CM | POA: Diagnosis not present

## 2021-03-27 DIAGNOSIS — Z992 Dependence on renal dialysis: Secondary | ICD-10-CM | POA: Diagnosis not present

## 2021-03-28 DIAGNOSIS — N2581 Secondary hyperparathyroidism of renal origin: Secondary | ICD-10-CM | POA: Diagnosis not present

## 2021-03-28 DIAGNOSIS — D631 Anemia in chronic kidney disease: Secondary | ICD-10-CM | POA: Diagnosis not present

## 2021-03-28 DIAGNOSIS — D509 Iron deficiency anemia, unspecified: Secondary | ICD-10-CM | POA: Diagnosis not present

## 2021-03-28 DIAGNOSIS — N186 End stage renal disease: Secondary | ICD-10-CM | POA: Diagnosis not present

## 2021-03-28 DIAGNOSIS — Z992 Dependence on renal dialysis: Secondary | ICD-10-CM | POA: Diagnosis not present

## 2021-03-29 DIAGNOSIS — N2581 Secondary hyperparathyroidism of renal origin: Secondary | ICD-10-CM | POA: Diagnosis not present

## 2021-03-29 DIAGNOSIS — D509 Iron deficiency anemia, unspecified: Secondary | ICD-10-CM | POA: Diagnosis not present

## 2021-03-29 DIAGNOSIS — D631 Anemia in chronic kidney disease: Secondary | ICD-10-CM | POA: Diagnosis not present

## 2021-03-29 DIAGNOSIS — N186 End stage renal disease: Secondary | ICD-10-CM | POA: Diagnosis not present

## 2021-03-29 DIAGNOSIS — Z992 Dependence on renal dialysis: Secondary | ICD-10-CM | POA: Diagnosis not present

## 2021-03-31 DIAGNOSIS — Z992 Dependence on renal dialysis: Secondary | ICD-10-CM | POA: Diagnosis not present

## 2021-03-31 DIAGNOSIS — N186 End stage renal disease: Secondary | ICD-10-CM | POA: Diagnosis not present

## 2021-03-31 DIAGNOSIS — N2581 Secondary hyperparathyroidism of renal origin: Secondary | ICD-10-CM | POA: Diagnosis not present

## 2021-03-31 DIAGNOSIS — D509 Iron deficiency anemia, unspecified: Secondary | ICD-10-CM | POA: Diagnosis not present

## 2021-03-31 DIAGNOSIS — D631 Anemia in chronic kidney disease: Secondary | ICD-10-CM | POA: Diagnosis not present

## 2021-04-03 DIAGNOSIS — D631 Anemia in chronic kidney disease: Secondary | ICD-10-CM | POA: Diagnosis not present

## 2021-04-03 DIAGNOSIS — N2581 Secondary hyperparathyroidism of renal origin: Secondary | ICD-10-CM | POA: Diagnosis not present

## 2021-04-03 DIAGNOSIS — Z992 Dependence on renal dialysis: Secondary | ICD-10-CM | POA: Diagnosis not present

## 2021-04-03 DIAGNOSIS — D509 Iron deficiency anemia, unspecified: Secondary | ICD-10-CM | POA: Diagnosis not present

## 2021-04-03 DIAGNOSIS — N186 End stage renal disease: Secondary | ICD-10-CM | POA: Diagnosis not present

## 2021-04-05 DIAGNOSIS — N186 End stage renal disease: Secondary | ICD-10-CM | POA: Diagnosis not present

## 2021-04-05 DIAGNOSIS — N2581 Secondary hyperparathyroidism of renal origin: Secondary | ICD-10-CM | POA: Diagnosis not present

## 2021-04-05 DIAGNOSIS — Z992 Dependence on renal dialysis: Secondary | ICD-10-CM | POA: Diagnosis not present

## 2021-04-05 DIAGNOSIS — D509 Iron deficiency anemia, unspecified: Secondary | ICD-10-CM | POA: Diagnosis not present

## 2021-04-05 DIAGNOSIS — D631 Anemia in chronic kidney disease: Secondary | ICD-10-CM | POA: Diagnosis not present

## 2021-04-07 DIAGNOSIS — Z992 Dependence on renal dialysis: Secondary | ICD-10-CM | POA: Diagnosis not present

## 2021-04-07 DIAGNOSIS — D631 Anemia in chronic kidney disease: Secondary | ICD-10-CM | POA: Diagnosis not present

## 2021-04-07 DIAGNOSIS — D509 Iron deficiency anemia, unspecified: Secondary | ICD-10-CM | POA: Diagnosis not present

## 2021-04-07 DIAGNOSIS — N186 End stage renal disease: Secondary | ICD-10-CM | POA: Diagnosis not present

## 2021-04-07 DIAGNOSIS — N2581 Secondary hyperparathyroidism of renal origin: Secondary | ICD-10-CM | POA: Diagnosis not present

## 2021-04-10 DIAGNOSIS — Z992 Dependence on renal dialysis: Secondary | ICD-10-CM | POA: Diagnosis not present

## 2021-04-10 DIAGNOSIS — N186 End stage renal disease: Secondary | ICD-10-CM | POA: Diagnosis not present

## 2021-04-10 DIAGNOSIS — N2581 Secondary hyperparathyroidism of renal origin: Secondary | ICD-10-CM | POA: Diagnosis not present

## 2021-04-10 DIAGNOSIS — D631 Anemia in chronic kidney disease: Secondary | ICD-10-CM | POA: Diagnosis not present

## 2021-04-10 DIAGNOSIS — D509 Iron deficiency anemia, unspecified: Secondary | ICD-10-CM | POA: Diagnosis not present

## 2021-04-12 DIAGNOSIS — Z992 Dependence on renal dialysis: Secondary | ICD-10-CM | POA: Diagnosis not present

## 2021-04-12 DIAGNOSIS — N186 End stage renal disease: Secondary | ICD-10-CM | POA: Diagnosis not present

## 2021-04-12 DIAGNOSIS — D631 Anemia in chronic kidney disease: Secondary | ICD-10-CM | POA: Diagnosis not present

## 2021-04-12 DIAGNOSIS — D509 Iron deficiency anemia, unspecified: Secondary | ICD-10-CM | POA: Diagnosis not present

## 2021-04-12 DIAGNOSIS — N2581 Secondary hyperparathyroidism of renal origin: Secondary | ICD-10-CM | POA: Diagnosis not present

## 2021-04-14 DIAGNOSIS — Z992 Dependence on renal dialysis: Secondary | ICD-10-CM | POA: Diagnosis not present

## 2021-04-14 DIAGNOSIS — D631 Anemia in chronic kidney disease: Secondary | ICD-10-CM | POA: Diagnosis not present

## 2021-04-14 DIAGNOSIS — N2581 Secondary hyperparathyroidism of renal origin: Secondary | ICD-10-CM | POA: Diagnosis not present

## 2021-04-14 DIAGNOSIS — N186 End stage renal disease: Secondary | ICD-10-CM | POA: Diagnosis not present

## 2021-04-14 DIAGNOSIS — D509 Iron deficiency anemia, unspecified: Secondary | ICD-10-CM | POA: Diagnosis not present

## 2021-04-15 DIAGNOSIS — E7849 Other hyperlipidemia: Secondary | ICD-10-CM | POA: Diagnosis not present

## 2021-04-15 DIAGNOSIS — I10 Essential (primary) hypertension: Secondary | ICD-10-CM | POA: Diagnosis not present

## 2021-04-15 DIAGNOSIS — Z992 Dependence on renal dialysis: Secondary | ICD-10-CM | POA: Diagnosis not present

## 2021-04-15 DIAGNOSIS — N186 End stage renal disease: Secondary | ICD-10-CM | POA: Diagnosis not present

## 2021-04-15 DIAGNOSIS — E1129 Type 2 diabetes mellitus with other diabetic kidney complication: Secondary | ICD-10-CM | POA: Diagnosis not present

## 2021-04-16 DIAGNOSIS — D631 Anemia in chronic kidney disease: Secondary | ICD-10-CM | POA: Diagnosis not present

## 2021-04-16 DIAGNOSIS — D509 Iron deficiency anemia, unspecified: Secondary | ICD-10-CM | POA: Diagnosis not present

## 2021-04-16 DIAGNOSIS — N186 End stage renal disease: Secondary | ICD-10-CM | POA: Diagnosis not present

## 2021-04-16 DIAGNOSIS — Z992 Dependence on renal dialysis: Secondary | ICD-10-CM | POA: Diagnosis not present

## 2021-04-16 DIAGNOSIS — N2581 Secondary hyperparathyroidism of renal origin: Secondary | ICD-10-CM | POA: Diagnosis not present

## 2021-04-16 HISTORY — PX: AV FISTULA REPAIR: SHX563

## 2021-04-17 DIAGNOSIS — D509 Iron deficiency anemia, unspecified: Secondary | ICD-10-CM | POA: Diagnosis not present

## 2021-04-17 DIAGNOSIS — N186 End stage renal disease: Secondary | ICD-10-CM | POA: Diagnosis not present

## 2021-04-17 DIAGNOSIS — Z20822 Contact with and (suspected) exposure to covid-19: Secondary | ICD-10-CM | POA: Diagnosis not present

## 2021-04-17 DIAGNOSIS — D631 Anemia in chronic kidney disease: Secondary | ICD-10-CM | POA: Diagnosis not present

## 2021-04-17 DIAGNOSIS — N2581 Secondary hyperparathyroidism of renal origin: Secondary | ICD-10-CM | POA: Diagnosis not present

## 2021-04-17 DIAGNOSIS — Z992 Dependence on renal dialysis: Secondary | ICD-10-CM | POA: Diagnosis not present

## 2021-04-19 DIAGNOSIS — N2581 Secondary hyperparathyroidism of renal origin: Secondary | ICD-10-CM | POA: Diagnosis not present

## 2021-04-19 DIAGNOSIS — N186 End stage renal disease: Secondary | ICD-10-CM | POA: Diagnosis not present

## 2021-04-19 DIAGNOSIS — Z992 Dependence on renal dialysis: Secondary | ICD-10-CM | POA: Diagnosis not present

## 2021-04-19 DIAGNOSIS — D509 Iron deficiency anemia, unspecified: Secondary | ICD-10-CM | POA: Diagnosis not present

## 2021-04-19 DIAGNOSIS — D631 Anemia in chronic kidney disease: Secondary | ICD-10-CM | POA: Diagnosis not present

## 2021-04-21 DIAGNOSIS — N2581 Secondary hyperparathyroidism of renal origin: Secondary | ICD-10-CM | POA: Diagnosis not present

## 2021-04-21 DIAGNOSIS — N186 End stage renal disease: Secondary | ICD-10-CM | POA: Diagnosis not present

## 2021-04-21 DIAGNOSIS — D631 Anemia in chronic kidney disease: Secondary | ICD-10-CM | POA: Diagnosis not present

## 2021-04-21 DIAGNOSIS — D509 Iron deficiency anemia, unspecified: Secondary | ICD-10-CM | POA: Diagnosis not present

## 2021-04-21 DIAGNOSIS — Z992 Dependence on renal dialysis: Secondary | ICD-10-CM | POA: Diagnosis not present

## 2021-04-24 DIAGNOSIS — D509 Iron deficiency anemia, unspecified: Secondary | ICD-10-CM | POA: Diagnosis not present

## 2021-04-24 DIAGNOSIS — Z992 Dependence on renal dialysis: Secondary | ICD-10-CM | POA: Diagnosis not present

## 2021-04-24 DIAGNOSIS — N2581 Secondary hyperparathyroidism of renal origin: Secondary | ICD-10-CM | POA: Diagnosis not present

## 2021-04-24 DIAGNOSIS — N186 End stage renal disease: Secondary | ICD-10-CM | POA: Diagnosis not present

## 2021-04-26 DIAGNOSIS — N2581 Secondary hyperparathyroidism of renal origin: Secondary | ICD-10-CM | POA: Diagnosis not present

## 2021-04-26 DIAGNOSIS — Z992 Dependence on renal dialysis: Secondary | ICD-10-CM | POA: Diagnosis not present

## 2021-04-26 DIAGNOSIS — D509 Iron deficiency anemia, unspecified: Secondary | ICD-10-CM | POA: Diagnosis not present

## 2021-04-26 DIAGNOSIS — N186 End stage renal disease: Secondary | ICD-10-CM | POA: Diagnosis not present

## 2021-04-27 DIAGNOSIS — D631 Anemia in chronic kidney disease: Secondary | ICD-10-CM | POA: Diagnosis not present

## 2021-04-27 DIAGNOSIS — N186 End stage renal disease: Secondary | ICD-10-CM | POA: Diagnosis not present

## 2021-04-27 DIAGNOSIS — N2581 Secondary hyperparathyroidism of renal origin: Secondary | ICD-10-CM | POA: Diagnosis not present

## 2021-04-27 DIAGNOSIS — D509 Iron deficiency anemia, unspecified: Secondary | ICD-10-CM | POA: Diagnosis not present

## 2021-04-27 DIAGNOSIS — Z992 Dependence on renal dialysis: Secondary | ICD-10-CM | POA: Diagnosis not present

## 2021-04-28 DIAGNOSIS — N186 End stage renal disease: Secondary | ICD-10-CM | POA: Diagnosis not present

## 2021-04-28 DIAGNOSIS — Z992 Dependence on renal dialysis: Secondary | ICD-10-CM | POA: Diagnosis not present

## 2021-04-28 DIAGNOSIS — D509 Iron deficiency anemia, unspecified: Secondary | ICD-10-CM | POA: Diagnosis not present

## 2021-04-28 DIAGNOSIS — N2581 Secondary hyperparathyroidism of renal origin: Secondary | ICD-10-CM | POA: Diagnosis not present

## 2021-05-01 DIAGNOSIS — D509 Iron deficiency anemia, unspecified: Secondary | ICD-10-CM | POA: Diagnosis not present

## 2021-05-01 DIAGNOSIS — D631 Anemia in chronic kidney disease: Secondary | ICD-10-CM | POA: Diagnosis not present

## 2021-05-01 DIAGNOSIS — N2581 Secondary hyperparathyroidism of renal origin: Secondary | ICD-10-CM | POA: Diagnosis not present

## 2021-05-01 DIAGNOSIS — N186 End stage renal disease: Secondary | ICD-10-CM | POA: Diagnosis not present

## 2021-05-01 DIAGNOSIS — Z992 Dependence on renal dialysis: Secondary | ICD-10-CM | POA: Diagnosis not present

## 2021-05-03 DIAGNOSIS — Z992 Dependence on renal dialysis: Secondary | ICD-10-CM | POA: Diagnosis not present

## 2021-05-03 DIAGNOSIS — N186 End stage renal disease: Secondary | ICD-10-CM | POA: Diagnosis not present

## 2021-05-03 DIAGNOSIS — D631 Anemia in chronic kidney disease: Secondary | ICD-10-CM | POA: Diagnosis not present

## 2021-05-03 DIAGNOSIS — D509 Iron deficiency anemia, unspecified: Secondary | ICD-10-CM | POA: Diagnosis not present

## 2021-05-03 DIAGNOSIS — N2581 Secondary hyperparathyroidism of renal origin: Secondary | ICD-10-CM | POA: Diagnosis not present

## 2021-05-05 DIAGNOSIS — D509 Iron deficiency anemia, unspecified: Secondary | ICD-10-CM | POA: Diagnosis not present

## 2021-05-05 DIAGNOSIS — N186 End stage renal disease: Secondary | ICD-10-CM | POA: Diagnosis not present

## 2021-05-05 DIAGNOSIS — N2581 Secondary hyperparathyroidism of renal origin: Secondary | ICD-10-CM | POA: Diagnosis not present

## 2021-05-05 DIAGNOSIS — D631 Anemia in chronic kidney disease: Secondary | ICD-10-CM | POA: Diagnosis not present

## 2021-05-05 DIAGNOSIS — Z992 Dependence on renal dialysis: Secondary | ICD-10-CM | POA: Diagnosis not present

## 2021-05-08 DIAGNOSIS — Z992 Dependence on renal dialysis: Secondary | ICD-10-CM | POA: Diagnosis not present

## 2021-05-08 DIAGNOSIS — N186 End stage renal disease: Secondary | ICD-10-CM | POA: Diagnosis not present

## 2021-05-08 DIAGNOSIS — N2581 Secondary hyperparathyroidism of renal origin: Secondary | ICD-10-CM | POA: Diagnosis not present

## 2021-05-08 DIAGNOSIS — D509 Iron deficiency anemia, unspecified: Secondary | ICD-10-CM | POA: Diagnosis not present

## 2021-05-08 DIAGNOSIS — D631 Anemia in chronic kidney disease: Secondary | ICD-10-CM | POA: Diagnosis not present

## 2021-05-10 DIAGNOSIS — N2581 Secondary hyperparathyroidism of renal origin: Secondary | ICD-10-CM | POA: Diagnosis not present

## 2021-05-10 DIAGNOSIS — N186 End stage renal disease: Secondary | ICD-10-CM | POA: Diagnosis not present

## 2021-05-10 DIAGNOSIS — D509 Iron deficiency anemia, unspecified: Secondary | ICD-10-CM | POA: Diagnosis not present

## 2021-05-10 DIAGNOSIS — Z992 Dependence on renal dialysis: Secondary | ICD-10-CM | POA: Diagnosis not present

## 2021-05-10 DIAGNOSIS — D631 Anemia in chronic kidney disease: Secondary | ICD-10-CM | POA: Diagnosis not present

## 2021-05-12 DIAGNOSIS — Z992 Dependence on renal dialysis: Secondary | ICD-10-CM | POA: Diagnosis not present

## 2021-05-12 DIAGNOSIS — D631 Anemia in chronic kidney disease: Secondary | ICD-10-CM | POA: Diagnosis not present

## 2021-05-12 DIAGNOSIS — N186 End stage renal disease: Secondary | ICD-10-CM | POA: Diagnosis not present

## 2021-05-12 DIAGNOSIS — D509 Iron deficiency anemia, unspecified: Secondary | ICD-10-CM | POA: Diagnosis not present

## 2021-05-12 DIAGNOSIS — N2581 Secondary hyperparathyroidism of renal origin: Secondary | ICD-10-CM | POA: Diagnosis not present

## 2021-05-15 DIAGNOSIS — D631 Anemia in chronic kidney disease: Secondary | ICD-10-CM | POA: Diagnosis not present

## 2021-05-15 DIAGNOSIS — D509 Iron deficiency anemia, unspecified: Secondary | ICD-10-CM | POA: Diagnosis not present

## 2021-05-15 DIAGNOSIS — Z992 Dependence on renal dialysis: Secondary | ICD-10-CM | POA: Diagnosis not present

## 2021-05-15 DIAGNOSIS — N2581 Secondary hyperparathyroidism of renal origin: Secondary | ICD-10-CM | POA: Diagnosis not present

## 2021-05-15 DIAGNOSIS — N186 End stage renal disease: Secondary | ICD-10-CM | POA: Diagnosis not present

## 2021-05-16 DIAGNOSIS — I10 Essential (primary) hypertension: Secondary | ICD-10-CM | POA: Diagnosis not present

## 2021-05-16 DIAGNOSIS — Z992 Dependence on renal dialysis: Secondary | ICD-10-CM | POA: Diagnosis not present

## 2021-05-16 DIAGNOSIS — N186 End stage renal disease: Secondary | ICD-10-CM | POA: Diagnosis not present

## 2021-05-16 DIAGNOSIS — E1129 Type 2 diabetes mellitus with other diabetic kidney complication: Secondary | ICD-10-CM | POA: Diagnosis not present

## 2021-05-16 DIAGNOSIS — E782 Mixed hyperlipidemia: Secondary | ICD-10-CM | POA: Diagnosis not present

## 2021-05-17 DIAGNOSIS — Z992 Dependence on renal dialysis: Secondary | ICD-10-CM | POA: Diagnosis not present

## 2021-05-17 DIAGNOSIS — N186 End stage renal disease: Secondary | ICD-10-CM | POA: Diagnosis not present

## 2021-05-17 DIAGNOSIS — D631 Anemia in chronic kidney disease: Secondary | ICD-10-CM | POA: Diagnosis not present

## 2021-05-17 DIAGNOSIS — N2581 Secondary hyperparathyroidism of renal origin: Secondary | ICD-10-CM | POA: Diagnosis not present

## 2021-05-17 DIAGNOSIS — D509 Iron deficiency anemia, unspecified: Secondary | ICD-10-CM | POA: Diagnosis not present

## 2021-05-19 DIAGNOSIS — Z992 Dependence on renal dialysis: Secondary | ICD-10-CM | POA: Diagnosis not present

## 2021-05-19 DIAGNOSIS — N2581 Secondary hyperparathyroidism of renal origin: Secondary | ICD-10-CM | POA: Diagnosis not present

## 2021-05-19 DIAGNOSIS — D509 Iron deficiency anemia, unspecified: Secondary | ICD-10-CM | POA: Diagnosis not present

## 2021-05-19 DIAGNOSIS — N186 End stage renal disease: Secondary | ICD-10-CM | POA: Diagnosis not present

## 2021-05-19 DIAGNOSIS — D631 Anemia in chronic kidney disease: Secondary | ICD-10-CM | POA: Diagnosis not present

## 2021-05-22 DIAGNOSIS — D509 Iron deficiency anemia, unspecified: Secondary | ICD-10-CM | POA: Diagnosis not present

## 2021-05-22 DIAGNOSIS — Z992 Dependence on renal dialysis: Secondary | ICD-10-CM | POA: Diagnosis not present

## 2021-05-22 DIAGNOSIS — D631 Anemia in chronic kidney disease: Secondary | ICD-10-CM | POA: Diagnosis not present

## 2021-05-22 DIAGNOSIS — N2581 Secondary hyperparathyroidism of renal origin: Secondary | ICD-10-CM | POA: Diagnosis not present

## 2021-05-22 DIAGNOSIS — N186 End stage renal disease: Secondary | ICD-10-CM | POA: Diagnosis not present

## 2021-05-24 DIAGNOSIS — N186 End stage renal disease: Secondary | ICD-10-CM | POA: Diagnosis not present

## 2021-05-24 DIAGNOSIS — Z992 Dependence on renal dialysis: Secondary | ICD-10-CM | POA: Diagnosis not present

## 2021-05-24 DIAGNOSIS — N2581 Secondary hyperparathyroidism of renal origin: Secondary | ICD-10-CM | POA: Diagnosis not present

## 2021-05-24 DIAGNOSIS — D631 Anemia in chronic kidney disease: Secondary | ICD-10-CM | POA: Diagnosis not present

## 2021-05-24 DIAGNOSIS — D509 Iron deficiency anemia, unspecified: Secondary | ICD-10-CM | POA: Diagnosis not present

## 2021-05-26 DIAGNOSIS — Z992 Dependence on renal dialysis: Secondary | ICD-10-CM | POA: Diagnosis not present

## 2021-05-26 DIAGNOSIS — D631 Anemia in chronic kidney disease: Secondary | ICD-10-CM | POA: Diagnosis not present

## 2021-05-26 DIAGNOSIS — N186 End stage renal disease: Secondary | ICD-10-CM | POA: Diagnosis not present

## 2021-05-26 DIAGNOSIS — D509 Iron deficiency anemia, unspecified: Secondary | ICD-10-CM | POA: Diagnosis not present

## 2021-05-26 DIAGNOSIS — N2581 Secondary hyperparathyroidism of renal origin: Secondary | ICD-10-CM | POA: Diagnosis not present

## 2021-05-27 DIAGNOSIS — D509 Iron deficiency anemia, unspecified: Secondary | ICD-10-CM | POA: Diagnosis not present

## 2021-05-27 DIAGNOSIS — D631 Anemia in chronic kidney disease: Secondary | ICD-10-CM | POA: Diagnosis not present

## 2021-05-27 DIAGNOSIS — N2581 Secondary hyperparathyroidism of renal origin: Secondary | ICD-10-CM | POA: Diagnosis not present

## 2021-05-27 DIAGNOSIS — N186 End stage renal disease: Secondary | ICD-10-CM | POA: Diagnosis not present

## 2021-05-27 DIAGNOSIS — Z992 Dependence on renal dialysis: Secondary | ICD-10-CM | POA: Diagnosis not present

## 2021-05-29 DIAGNOSIS — D631 Anemia in chronic kidney disease: Secondary | ICD-10-CM | POA: Diagnosis not present

## 2021-05-29 DIAGNOSIS — N186 End stage renal disease: Secondary | ICD-10-CM | POA: Diagnosis not present

## 2021-05-29 DIAGNOSIS — N2581 Secondary hyperparathyroidism of renal origin: Secondary | ICD-10-CM | POA: Diagnosis not present

## 2021-05-29 DIAGNOSIS — D509 Iron deficiency anemia, unspecified: Secondary | ICD-10-CM | POA: Diagnosis not present

## 2021-05-29 DIAGNOSIS — Z992 Dependence on renal dialysis: Secondary | ICD-10-CM | POA: Diagnosis not present

## 2021-05-31 DIAGNOSIS — N186 End stage renal disease: Secondary | ICD-10-CM | POA: Diagnosis not present

## 2021-05-31 DIAGNOSIS — Z992 Dependence on renal dialysis: Secondary | ICD-10-CM | POA: Diagnosis not present

## 2021-05-31 DIAGNOSIS — N2581 Secondary hyperparathyroidism of renal origin: Secondary | ICD-10-CM | POA: Diagnosis not present

## 2021-05-31 DIAGNOSIS — D631 Anemia in chronic kidney disease: Secondary | ICD-10-CM | POA: Diagnosis not present

## 2021-05-31 DIAGNOSIS — D509 Iron deficiency anemia, unspecified: Secondary | ICD-10-CM | POA: Diagnosis not present

## 2021-06-02 DIAGNOSIS — D509 Iron deficiency anemia, unspecified: Secondary | ICD-10-CM | POA: Diagnosis not present

## 2021-06-02 DIAGNOSIS — N186 End stage renal disease: Secondary | ICD-10-CM | POA: Diagnosis not present

## 2021-06-02 DIAGNOSIS — D631 Anemia in chronic kidney disease: Secondary | ICD-10-CM | POA: Diagnosis not present

## 2021-06-02 DIAGNOSIS — Z992 Dependence on renal dialysis: Secondary | ICD-10-CM | POA: Diagnosis not present

## 2021-06-02 DIAGNOSIS — N2581 Secondary hyperparathyroidism of renal origin: Secondary | ICD-10-CM | POA: Diagnosis not present

## 2021-06-05 DIAGNOSIS — D631 Anemia in chronic kidney disease: Secondary | ICD-10-CM | POA: Diagnosis not present

## 2021-06-05 DIAGNOSIS — D509 Iron deficiency anemia, unspecified: Secondary | ICD-10-CM | POA: Diagnosis not present

## 2021-06-05 DIAGNOSIS — Z992 Dependence on renal dialysis: Secondary | ICD-10-CM | POA: Diagnosis not present

## 2021-06-05 DIAGNOSIS — N2581 Secondary hyperparathyroidism of renal origin: Secondary | ICD-10-CM | POA: Diagnosis not present

## 2021-06-05 DIAGNOSIS — N186 End stage renal disease: Secondary | ICD-10-CM | POA: Diagnosis not present

## 2021-06-07 DIAGNOSIS — Z992 Dependence on renal dialysis: Secondary | ICD-10-CM | POA: Diagnosis not present

## 2021-06-07 DIAGNOSIS — D631 Anemia in chronic kidney disease: Secondary | ICD-10-CM | POA: Diagnosis not present

## 2021-06-07 DIAGNOSIS — N2581 Secondary hyperparathyroidism of renal origin: Secondary | ICD-10-CM | POA: Diagnosis not present

## 2021-06-07 DIAGNOSIS — N186 End stage renal disease: Secondary | ICD-10-CM | POA: Diagnosis not present

## 2021-06-07 DIAGNOSIS — D509 Iron deficiency anemia, unspecified: Secondary | ICD-10-CM | POA: Diagnosis not present

## 2021-06-09 DIAGNOSIS — D631 Anemia in chronic kidney disease: Secondary | ICD-10-CM | POA: Diagnosis not present

## 2021-06-09 DIAGNOSIS — N2581 Secondary hyperparathyroidism of renal origin: Secondary | ICD-10-CM | POA: Diagnosis not present

## 2021-06-09 DIAGNOSIS — N186 End stage renal disease: Secondary | ICD-10-CM | POA: Diagnosis not present

## 2021-06-09 DIAGNOSIS — D509 Iron deficiency anemia, unspecified: Secondary | ICD-10-CM | POA: Diagnosis not present

## 2021-06-09 DIAGNOSIS — Z992 Dependence on renal dialysis: Secondary | ICD-10-CM | POA: Diagnosis not present

## 2021-06-12 DIAGNOSIS — N186 End stage renal disease: Secondary | ICD-10-CM | POA: Diagnosis not present

## 2021-06-12 DIAGNOSIS — Z992 Dependence on renal dialysis: Secondary | ICD-10-CM | POA: Diagnosis not present

## 2021-06-12 DIAGNOSIS — D631 Anemia in chronic kidney disease: Secondary | ICD-10-CM | POA: Diagnosis not present

## 2021-06-12 DIAGNOSIS — N2581 Secondary hyperparathyroidism of renal origin: Secondary | ICD-10-CM | POA: Diagnosis not present

## 2021-06-12 DIAGNOSIS — D509 Iron deficiency anemia, unspecified: Secondary | ICD-10-CM | POA: Diagnosis not present

## 2021-06-14 DIAGNOSIS — Z992 Dependence on renal dialysis: Secondary | ICD-10-CM | POA: Diagnosis not present

## 2021-06-14 DIAGNOSIS — D631 Anemia in chronic kidney disease: Secondary | ICD-10-CM | POA: Diagnosis not present

## 2021-06-14 DIAGNOSIS — N2581 Secondary hyperparathyroidism of renal origin: Secondary | ICD-10-CM | POA: Diagnosis not present

## 2021-06-14 DIAGNOSIS — N186 End stage renal disease: Secondary | ICD-10-CM | POA: Diagnosis not present

## 2021-06-14 DIAGNOSIS — D509 Iron deficiency anemia, unspecified: Secondary | ICD-10-CM | POA: Diagnosis not present

## 2021-06-15 DIAGNOSIS — Z992 Dependence on renal dialysis: Secondary | ICD-10-CM | POA: Diagnosis not present

## 2021-06-15 DIAGNOSIS — E782 Mixed hyperlipidemia: Secondary | ICD-10-CM | POA: Diagnosis not present

## 2021-06-15 DIAGNOSIS — C3410 Malignant neoplasm of upper lobe, unspecified bronchus or lung: Secondary | ICD-10-CM | POA: Diagnosis not present

## 2021-06-15 DIAGNOSIS — I10 Essential (primary) hypertension: Secondary | ICD-10-CM | POA: Diagnosis not present

## 2021-06-15 DIAGNOSIS — N186 End stage renal disease: Secondary | ICD-10-CM | POA: Diagnosis not present

## 2021-06-16 DIAGNOSIS — D509 Iron deficiency anemia, unspecified: Secondary | ICD-10-CM | POA: Diagnosis not present

## 2021-06-16 DIAGNOSIS — Z992 Dependence on renal dialysis: Secondary | ICD-10-CM | POA: Diagnosis not present

## 2021-06-16 DIAGNOSIS — D631 Anemia in chronic kidney disease: Secondary | ICD-10-CM | POA: Diagnosis not present

## 2021-06-16 DIAGNOSIS — N2581 Secondary hyperparathyroidism of renal origin: Secondary | ICD-10-CM | POA: Diagnosis not present

## 2021-06-16 DIAGNOSIS — N186 End stage renal disease: Secondary | ICD-10-CM | POA: Diagnosis not present

## 2021-06-19 DIAGNOSIS — D631 Anemia in chronic kidney disease: Secondary | ICD-10-CM | POA: Diagnosis not present

## 2021-06-19 DIAGNOSIS — N186 End stage renal disease: Secondary | ICD-10-CM | POA: Diagnosis not present

## 2021-06-19 DIAGNOSIS — N2581 Secondary hyperparathyroidism of renal origin: Secondary | ICD-10-CM | POA: Diagnosis not present

## 2021-06-19 DIAGNOSIS — Z992 Dependence on renal dialysis: Secondary | ICD-10-CM | POA: Diagnosis not present

## 2021-06-21 DIAGNOSIS — N2581 Secondary hyperparathyroidism of renal origin: Secondary | ICD-10-CM | POA: Diagnosis not present

## 2021-06-21 DIAGNOSIS — N186 End stage renal disease: Secondary | ICD-10-CM | POA: Diagnosis not present

## 2021-06-21 DIAGNOSIS — D631 Anemia in chronic kidney disease: Secondary | ICD-10-CM | POA: Diagnosis not present

## 2021-06-21 DIAGNOSIS — Z992 Dependence on renal dialysis: Secondary | ICD-10-CM | POA: Diagnosis not present

## 2021-06-21 DIAGNOSIS — D509 Iron deficiency anemia, unspecified: Secondary | ICD-10-CM | POA: Diagnosis not present

## 2021-06-23 DIAGNOSIS — D509 Iron deficiency anemia, unspecified: Secondary | ICD-10-CM | POA: Diagnosis not present

## 2021-06-23 DIAGNOSIS — Z992 Dependence on renal dialysis: Secondary | ICD-10-CM | POA: Diagnosis not present

## 2021-06-23 DIAGNOSIS — N186 End stage renal disease: Secondary | ICD-10-CM | POA: Diagnosis not present

## 2021-06-23 DIAGNOSIS — D631 Anemia in chronic kidney disease: Secondary | ICD-10-CM | POA: Diagnosis not present

## 2021-06-23 DIAGNOSIS — N2581 Secondary hyperparathyroidism of renal origin: Secondary | ICD-10-CM | POA: Diagnosis not present

## 2021-06-26 ENCOUNTER — Observation Stay (HOSPITAL_COMMUNITY): Payer: Medicare Other

## 2021-06-26 ENCOUNTER — Encounter (HOSPITAL_COMMUNITY): Payer: Self-pay

## 2021-06-26 ENCOUNTER — Observation Stay (HOSPITAL_COMMUNITY)
Admission: EM | Admit: 2021-06-26 | Discharge: 2021-06-28 | Disposition: A | Discharge status: Home / Self Care | Payer: Medicare Other | Attending: Internal Medicine | Admitting: Internal Medicine

## 2021-06-26 ENCOUNTER — Other Ambulatory Visit: Payer: Self-pay

## 2021-06-26 ENCOUNTER — Emergency Department (HOSPITAL_COMMUNITY): Payer: Medicare Other

## 2021-06-26 DIAGNOSIS — Z853 Personal history of malignant neoplasm of breast: Secondary | ICD-10-CM | POA: Diagnosis not present

## 2021-06-26 DIAGNOSIS — E119 Type 2 diabetes mellitus without complications: Secondary | ICD-10-CM

## 2021-06-26 DIAGNOSIS — Z79899 Other long term (current) drug therapy: Secondary | ICD-10-CM | POA: Insufficient documentation

## 2021-06-26 DIAGNOSIS — R0600 Dyspnea, unspecified: Secondary | ICD-10-CM | POA: Diagnosis not present

## 2021-06-26 DIAGNOSIS — N186 End stage renal disease: Secondary | ICD-10-CM | POA: Diagnosis not present

## 2021-06-26 DIAGNOSIS — U099 Post covid-19 condition, unspecified: Secondary | ICD-10-CM | POA: Insufficient documentation

## 2021-06-26 DIAGNOSIS — I10 Essential (primary) hypertension: Secondary | ICD-10-CM | POA: Diagnosis present

## 2021-06-26 DIAGNOSIS — I2699 Other pulmonary embolism without acute cor pulmonale: Secondary | ICD-10-CM | POA: Diagnosis not present

## 2021-06-26 DIAGNOSIS — Z7984 Long term (current) use of oral hypoglycemic drugs: Secondary | ICD-10-CM | POA: Diagnosis not present

## 2021-06-26 DIAGNOSIS — D631 Anemia in chronic kidney disease: Secondary | ICD-10-CM | POA: Diagnosis not present

## 2021-06-26 DIAGNOSIS — I517 Cardiomegaly: Secondary | ICD-10-CM | POA: Diagnosis not present

## 2021-06-26 DIAGNOSIS — Z20822 Contact with and (suspected) exposure to covid-19: Secondary | ICD-10-CM | POA: Diagnosis not present

## 2021-06-26 DIAGNOSIS — N2581 Secondary hyperparathyroidism of renal origin: Secondary | ICD-10-CM | POA: Diagnosis not present

## 2021-06-26 DIAGNOSIS — D509 Iron deficiency anemia, unspecified: Secondary | ICD-10-CM | POA: Diagnosis not present

## 2021-06-26 DIAGNOSIS — J8410 Pulmonary fibrosis, unspecified: Secondary | ICD-10-CM | POA: Insufficient documentation

## 2021-06-26 DIAGNOSIS — J811 Chronic pulmonary edema: Secondary | ICD-10-CM | POA: Diagnosis not present

## 2021-06-26 DIAGNOSIS — R0902 Hypoxemia: Secondary | ICD-10-CM | POA: Diagnosis not present

## 2021-06-26 DIAGNOSIS — G4733 Obstructive sleep apnea (adult) (pediatric): Secondary | ICD-10-CM | POA: Diagnosis present

## 2021-06-26 DIAGNOSIS — E1122 Type 2 diabetes mellitus with diabetic chronic kidney disease: Secondary | ICD-10-CM | POA: Insufficient documentation

## 2021-06-26 DIAGNOSIS — I132 Hypertensive heart and chronic kidney disease with heart failure and with stage 5 chronic kidney disease, or end stage renal disease: Secondary | ICD-10-CM | POA: Diagnosis not present

## 2021-06-26 DIAGNOSIS — J45909 Unspecified asthma, uncomplicated: Secondary | ICD-10-CM | POA: Insufficient documentation

## 2021-06-26 DIAGNOSIS — I5033 Acute on chronic diastolic (congestive) heart failure: Secondary | ICD-10-CM | POA: Diagnosis not present

## 2021-06-26 DIAGNOSIS — R079 Chest pain, unspecified: Secondary | ICD-10-CM | POA: Diagnosis not present

## 2021-06-26 DIAGNOSIS — Z992 Dependence on renal dialysis: Secondary | ICD-10-CM | POA: Insufficient documentation

## 2021-06-26 DIAGNOSIS — J9601 Acute respiratory failure with hypoxia: Secondary | ICD-10-CM | POA: Diagnosis not present

## 2021-06-26 DIAGNOSIS — R0602 Shortness of breath: Secondary | ICD-10-CM | POA: Diagnosis not present

## 2021-06-26 LAB — CBC WITH DIFFERENTIAL/PLATELET
Abs Immature Granulocytes: 0.04 10*3/uL (ref 0.00–0.07)
Basophils Absolute: 0 10*3/uL (ref 0.0–0.1)
Basophils Relative: 0 %
Eosinophils Absolute: 0.2 10*3/uL (ref 0.0–0.5)
Eosinophils Relative: 2 %
HCT: 31 % — ABNORMAL LOW (ref 36.0–46.0)
Hemoglobin: 9.6 g/dL — ABNORMAL LOW (ref 12.0–15.0)
Immature Granulocytes: 0 %
Lymphocytes Relative: 20 %
Lymphs Abs: 1.9 10*3/uL (ref 0.7–4.0)
MCH: 28.9 pg (ref 26.0–34.0)
MCHC: 31 g/dL (ref 30.0–36.0)
MCV: 93.4 fL (ref 80.0–100.0)
Monocytes Absolute: 0.5 10*3/uL (ref 0.1–1.0)
Monocytes Relative: 6 %
Neutro Abs: 6.7 10*3/uL (ref 1.7–7.7)
Neutrophils Relative %: 72 %
Platelets: 254 10*3/uL (ref 150–400)
RBC: 3.32 MIL/uL — ABNORMAL LOW (ref 3.87–5.11)
RDW: 15.1 % (ref 11.5–15.5)
WBC: 9.3 10*3/uL (ref 4.0–10.5)
nRBC: 0 % (ref 0.0–0.2)

## 2021-06-26 LAB — HEMOGLOBIN A1C
Hgb A1c MFr Bld: 8.3 % — ABNORMAL HIGH (ref 4.8–5.6)
Mean Plasma Glucose: 191.51 mg/dL

## 2021-06-26 LAB — GLUCOSE, CAPILLARY
Glucose-Capillary: 81 mg/dL (ref 70–99)
Glucose-Capillary: 167 mg/dL — ABNORMAL HIGH (ref 70–99)

## 2021-06-26 LAB — COMPREHENSIVE METABOLIC PANEL
ALT: 37 U/L (ref 0–44)
AST: 23 U/L (ref 15–41)
Albumin: 4.2 g/dL (ref 3.5–5.0)
Alkaline Phosphatase: 114 U/L (ref 38–126)
Anion gap: 13 (ref 5–15)
BUN: 25 mg/dL — ABNORMAL HIGH (ref 6–20)
CO2: 26 mmol/L (ref 22–32)
Calcium: 8.6 mg/dL — ABNORMAL LOW (ref 8.9–10.3)
Chloride: 100 mmol/L (ref 98–111)
Creatinine, Ser: 6.16 mg/dL — ABNORMAL HIGH (ref 0.44–1.00)
GFR, Estimated: 8 mL/min — ABNORMAL LOW (ref >60)
Glucose, Bld: 86 mg/dL (ref 70–99)
Potassium: 3.3 mmol/L — ABNORMAL LOW (ref 3.5–5.1)
Sodium: 139 mmol/L (ref 135–145)
Total Bilirubin: 0.7 mg/dL (ref 0.3–1.2)
Total Protein: 7.5 g/dL (ref 6.5–8.1)

## 2021-06-26 LAB — TROPONIN I (HIGH SENSITIVITY)
Troponin I (High Sensitivity): 24 ng/L — ABNORMAL HIGH (ref <18)
Troponin I (High Sensitivity): 26 ng/L — ABNORMAL HIGH (ref <18)

## 2021-06-26 LAB — PROTIME-INR
INR: 1.2 (ref 0.8–1.2)
Prothrombin Time: 15.2 seconds (ref 11.4–15.2)

## 2021-06-26 LAB — RESP PANEL BY RT-PCR (FLU A&B, COVID) ARPGX2
Influenza A by PCR: NEGATIVE
Influenza B by PCR: NEGATIVE
SARS Coronavirus 2 by RT PCR: NEGATIVE

## 2021-06-26 LAB — BRAIN NATRIURETIC PEPTIDE: B Natriuretic Peptide: 73 pg/mL (ref 0.0–100.0)

## 2021-06-26 LAB — HIV ANTIBODY (ROUTINE TESTING W REFLEX): HIV Screen 4th Generation wRfx: NONREACTIVE

## 2021-06-26 MED ORDER — MOMETASONE FURO-FORMOTEROL FUM 100-5 MCG/ACT IN AERO
2.0000 | INHALATION_SPRAY | Freq: Two times a day (BID) | RESPIRATORY_TRACT | Status: DC
  Administered 2021-06-27 – 2021-06-28 (×3): 2 via RESPIRATORY_TRACT
  Filled 2021-06-26: qty 8.8

## 2021-06-26 MED ORDER — ALBUTEROL SULFATE (2.5 MG/3ML) 0.083% IN NEBU: 2.5000 mg | INHALATION_SOLUTION | RESPIRATORY_TRACT | Status: DC | PRN

## 2021-06-26 MED ORDER — NIFEDIPINE ER OSMOTIC RELEASE 30 MG PO TB24
60.0000 mg | ORAL_TABLET | Freq: Every evening | ORAL | Status: DC
  Administered 2021-06-26: 60 mg via ORAL
  Filled 2021-06-26 (×2): qty 2

## 2021-06-26 MED ORDER — RENA-VITE PO TABS
1.0000 | ORAL_TABLET | Freq: Every day | ORAL | Status: DC
  Administered 2021-06-27 – 2021-06-28 (×2): 1 via ORAL
  Filled 2021-06-26 (×2): qty 1

## 2021-06-26 MED ORDER — IPRATROPIUM-ALBUTEROL 0.5-2.5 (3) MG/3ML IN SOLN
3.0000 mL | Freq: Once | RESPIRATORY_TRACT | Status: AC
  Administered 2021-06-26: 3 mL via RESPIRATORY_TRACT
  Filled 2021-06-26: qty 3

## 2021-06-26 MED ORDER — HEPARIN SODIUM (PORCINE) 5000 UNIT/ML IJ SOLN
5000.0000 [IU] | Freq: Three times a day (TID) | INTRAMUSCULAR | Status: DC
  Administered 2021-06-26 – 2021-06-28 (×4): 5000 [IU] via SUBCUTANEOUS
  Filled 2021-06-26 (×5): qty 1

## 2021-06-26 MED ORDER — IPRATROPIUM-ALBUTEROL 0.5-2.5 (3) MG/3ML IN SOLN
3.0000 mL | Freq: Four times a day (QID) | RESPIRATORY_TRACT | Status: DC
  Administered 2021-06-26: 3 mL via RESPIRATORY_TRACT
  Filled 2021-06-26: qty 3

## 2021-06-26 MED ORDER — INSULIN ASPART 100 UNIT/ML IJ SOLN
0.0000 [IU] | Freq: Three times a day (TID) | INTRAMUSCULAR | Status: DC
  Administered 2021-06-27 – 2021-06-28 (×3): 1 [IU] via SUBCUTANEOUS

## 2021-06-26 MED ORDER — IOHEXOL 350 MG/ML SOLN
100.0000 mL | Freq: Once | INTRAVENOUS | Status: AC | PRN
  Administered 2021-06-26: 100 mL via INTRAVENOUS

## 2021-06-26 MED ORDER — LABETALOL HCL 200 MG PO TABS
200.0000 mg | ORAL_TABLET | Freq: Two times a day (BID) | ORAL | Status: DC
  Administered 2021-06-26 – 2021-06-27 (×2): 200 mg via ORAL
  Filled 2021-06-26 (×3): qty 1

## 2021-06-26 MED ORDER — LINACLOTIDE 145 MCG PO CAPS
290.0000 ug | ORAL_CAPSULE | Freq: Every day | ORAL | Status: DC
  Administered 2021-06-27 – 2021-06-28 (×2): 290 ug via ORAL
  Filled 2021-06-26 (×2): qty 2

## 2021-06-26 MED ORDER — INSULIN ASPART 100 UNIT/ML IJ SOLN: 0.0000 [IU] | Freq: Every day | INTRAMUSCULAR | Status: DC

## 2021-06-26 MED ORDER — ROPINIROLE HCL ER 2 MG PO TB24
2.0000 mg | ORAL_TABLET | Freq: Every day | ORAL | Status: DC
  Filled 2021-06-26 (×2): qty 1

## 2021-06-26 MED ORDER — ACETAMINOPHEN 650 MG RE SUPP: 650.0000 mg | Freq: Four times a day (QID) | RECTAL | Status: DC | PRN

## 2021-06-26 MED ORDER — ONDANSETRON HCL 4 MG/2ML IJ SOLN: 4.0000 mg | Freq: Four times a day (QID) | INTRAMUSCULAR | Status: DC | PRN

## 2021-06-26 MED ORDER — POLYETHYLENE GLYCOL 3350 17 G PO PACK: 17.0000 g | PACK | Freq: Every day | ORAL | Status: DC | PRN

## 2021-06-26 MED ORDER — ACETAMINOPHEN 325 MG PO TABS
650.0000 mg | ORAL_TABLET | Freq: Four times a day (QID) | ORAL | Status: DC | PRN
  Administered 2021-06-26 – 2021-06-28 (×5): 650 mg via ORAL
  Filled 2021-06-26 (×5): qty 2

## 2021-06-26 MED ORDER — ROPINIROLE HCL 1 MG PO TABS
2.0000 mg | ORAL_TABLET | Freq: Once | ORAL | Status: AC
  Administered 2021-06-26: 2 mg via ORAL
  Filled 2021-06-26: qty 2

## 2021-06-26 MED ORDER — IPRATROPIUM-ALBUTEROL 0.5-2.5 (3) MG/3ML IN SOLN
3.0000 mL | Freq: Three times a day (TID) | RESPIRATORY_TRACT | Status: DC
  Administered 2021-06-27 – 2021-06-28 (×3): 3 mL via RESPIRATORY_TRACT
  Filled 2021-06-26 (×3): qty 3

## 2021-06-26 MED ORDER — SEVELAMER CARBONATE 800 MG PO TABS
2400.0000 mg | ORAL_TABLET | Freq: Every day | ORAL | Status: DC
  Administered 2021-06-27: 2400 mg via ORAL
  Filled 2021-06-26: qty 3

## 2021-06-26 MED ORDER — PANTOPRAZOLE SODIUM 40 MG PO TBEC
40.0000 mg | DELAYED_RELEASE_TABLET | Freq: Every day | ORAL | Status: DC
  Administered 2021-06-26 – 2021-06-27 (×2): 40 mg via ORAL
  Filled 2021-06-26 (×3): qty 1

## 2021-06-26 MED ORDER — ONDANSETRON HCL 4 MG PO TABS
4.0000 mg | ORAL_TABLET | Freq: Four times a day (QID) | ORAL | Status: DC | PRN
  Administered 2021-06-27: 4 mg via ORAL
  Filled 2021-06-26: qty 1

## 2021-06-26 MED ORDER — SEVELAMER CARBONATE 800 MG PO TABS
1600.0000 mg | ORAL_TABLET | Freq: Two times a day (BID) | ORAL | Status: DC
  Administered 2021-06-26 – 2021-06-28 (×4): 1600 mg via ORAL
  Filled 2021-06-26 (×4): qty 2

## 2021-06-26 NOTE — H&P (Signed)
History and Physical    Julia Alkhatib ZOX:[id number] DOB: 1971/10/29 DOA: 06/26/2021  PCP: Jake Samples, PA-C   Patient coming from: Home  I have personally briefly reviewed 83% on room air, improved with 2 L old medical records in [place]  Chief Complaint: SOB  HPI: Trenna Kiely is a 50 y.o. female with medical history significant for  ESRD, DM, HTN, Diastolic CHF.  Patient presented to ED with complaints of difficulty breathing on exertion and chest pain.  Patient reports difficulty breathing has been ongoing since she had a revision of her HD access fistula back in April/may ( No details in Epic/care everywhere).  She reports she unable to walk 10 feet without getting winded and short of breath.  She reports improvement in her symptoms with her inhaler.  She also reports nonradiating, central and intermittent chest pain, that is provoked mostly by movement and coughing.  She reports occasional chest pains with exertion. No change in weight, no leg swelling.  She has not been very active due to right ankle problems and difficulty breathing. Over the weekend, 2 days ago, symptoms significantly worsen, she waited to today to get dialyzed taking it to help with her symptoms, but with persistence of symptoms despite completing full session of dialysis patient presented to the ED. She has been on dialysis since 2015, and makes small amount of urine.  ED Course: O2 sats 83% on room air, currently on 2.5 L sats greater than 96%.  Blood pressure 150s to 170s.  Heart rate 70s.  Troponins 24 > 26.  Unremarkable CBC, potassium 3.3.  BNP 73.  Chest x-ray showed pulmonary vascular congestion.  ED provider reports initial wheezing on exam, duo nebs given in the ED, talked to nephrologist- Dr. Arty Baumgartner, patient to be seen in the morning.  Hospitalist will admit for new oxygen requirement.  Review of Systems: As per HPI all other systems reviewed and negative.  Past Medical History:   Diagnosis Date   Anemia    Ankle fracture    Arthritis    Blood transfusion without reported diagnosis    Breast cancer ([place])    Cancer ([place])    Diabetes mellitus without complication ([place])    Dialysis patient ([place])    mon, wed, friday,    End stage renal disease on dialysis ([place])    M/W/F [facility] in [place]   GERD (gastroesophageal reflux disease)    Hypertension    OSA (obstructive sleep apnea)    uses CPAP sometimes   Pneumonia    PONV (postoperative nausea and vomiting)    Wears glasses     Past Surgical History:  Procedure Laterality Date   ABDOMINAL HYSTERECTOMY     AV FISTULA PLACEMENT  11/2014   at [place] N/A 07/10/2016   Procedure: BALLOON DILATION;  Surgeon: Danie Binder, MD;  [facility];  Service: Endoscopy;  Laterality: N/A;  Pyloric dilation   BREAST LUMPECTOMY     CESAREAN SECTION     CHOLECYSTECTOMY     COLONOSCOPY WITH PROPOFOL N/A 09/27/2016   Dr. Gala Romney: Internal hemorrhoids repeat colonoscopy in 10 years   DILATION AND CURETTAGE OF UTERUS     ESOPHAGOGASTRODUODENOSCOPY N/A 07/10/2016   Dr.Fields- normal esophagus, gastric stenosis was found at the pylorus, gastritis on bx, normal examined duodenun   EXTERNAL FIXATION REMOVAL Right 10/29/2018   Procedure: REMOVAL RIGHT ANKLE BIOMET ZIMMER EXTERNAL FIXATOR, SHORT LEG CAST APPLICATION;  Surgeon: Marybelle Killings,  MD;  Location: [place];  Service: Orthopedics;  Laterality: Right;   MASTECTOMY     left sided   ORIF ANKLE FRACTURE Right 10/06/2018   Procedure: OPEN REDUCTION INTERNAL FIXATION (ORIF) RIGHT ANKLE TRIMALLEOLAR;  Surgeon: Marybelle Killings, MD;  Location: [place];  Service: Orthopedics;  Laterality: Right;     reports that she has never smoked. She has never used smokeless tobacco. She reports that she does not drink alcohol and does not use drugs.  Allergies  Allergen Reactions   Amlodipine Besylate Rash and Other (See Comments)    dizziness   Reglan  [Metoclopramide] Other (See Comments)    hallucinations     Family History  Problem Relation Age of Onset   Diabetes Mellitus II Mother    Hypertension Mother    Hypertension Sister    Hypertension Sister    Colon cancer Neg Hx    Prior to Admission medications   Medication Sig Start Date End Date Taking? Authorizing Provider  albuterol (PROVENTIL HFA;VENTOLIN HFA) 108 (90 Base) MCG/ACT inhaler Inhale 2 puffs into the lungs every 6 (six) hours as needed for wheezing or shortness of breath. 11/28/17   Kathie Dike, MD  albuterol (PROVENTIL) (2.5 MG/3ML) 0.083% nebulizer solution Take 3 mLs (2.5 mg total) by nebulization every 6 (six) hours as needed for wheezing or shortness of breath. 11/28/17   Kathie Dike, MD  bisacodyl (DULCOLAX) 5 MG EC tablet Take 5 mg by mouth daily as needed.    [provider]  docusate sodium (COLACE) 100 MG capsule Take 100 mg by mouth daily as needed.    [provider]  ferric citrate (AURYXIA) 1 GM 210 MG(Fe) tablet Take 2 tablets by mouth 2 (two) times daily with a meal. With Breakfast & with supper    [provider]  FLOVENT HFA 110 MCG/ACT inhaler 1 puff 2 (two) times daily. 12/04/19   [provider]  furosemide (LASIX) 40 MG tablet Take 40 mg by mouth 3 (three) times daily. 11/10/20   [provider]  glipiZIDE (GLUCOTROL) 5 MG tablet Take 5 mg by mouth 2 (two) times daily as needed. For blood sugar levels over 150    [provider]  ipratropium (ATROVENT) 0.03 % nasal spray Place 2 sprays into both nostrils 2 (two) times daily.  06/06/19   [provider]  linaclotide (LINZESS) 290 MCG CAPS capsule Take 290 mcg by mouth daily before breakfast.    [provider]  multivitamin (RENA-VIT) TABS tablet Take 1 tablet by mouth daily.    [provider]  NIFEdipine (ADALAT CC) 60 MG 24 hr tablet Take 1 tablet (60 mg total) by mouth every evening. 01/02/20   Hongalgi, Lenis Dickinson,  MD  ondansetron (ZOFRAN-ODT) 4 MG disintegrating tablet Take 4 mg by mouth as needed for nausea or vomiting.     [provider]  pantoprazole (PROTONIX) 40 MG tablet Take 1 tablet (40 mg total) by mouth daily. 30 minutes before breakfast 02/16/21   Annitta Needs, NP  rOPINIRole (REQUIP XL) 2 MG 24 hr tablet Take 2 mg by mouth at bedtime.  09/10/16   [provider]  sevelamer carbonate (RENVELA) 800 MG tablet Take 800 mg by mouth See admin instructions. Take 2 tablets (1600 mg) in the morning, take 3 tablets (2400 mg) with lunch or snack,  and take 2 tablets (1600 mg) by mouth in the evening with dinner meal.    [provider]  sodium chloride (  OCEAN) 0.65 % SOLN nasal spray Place 1 spray into both nostrils as needed for congestion. Patient taking differently: Place 1 spray into both nostrils daily as needed for congestion. 01/02/20   Modena Jansky, MD    Physical Exam: Vitals:   06/26/21 1436 06/26/21 1438 06/26/21 1502 06/26/21 1503  BP:    (!) 157/90  Pulse: 75 76 73 76  Resp: 17 (!) 30 16 (!) 23  Temp:      TempSrc:      SpO2: 93% (!) 89% 98% 98%  Weight:      Height:        Constitutional: NAD, calm, comfortable Vitals:   06/26/21 1436 06/26/21 1438 06/26/21 1502 06/26/21 1503  BP:    (!) 157/90  Pulse: 75 76 73 76  Resp: 17 (!) 30 16 (!) 23  Temp:      TempSrc:      SpO2: 93% (!) 89% 98% 98%  Weight:      Height:       Eyes: PERRL, lids and conjunctivae normal ENMT: Mucous membranes are moist..  Neck: normal, supple, no masses, no thyromegaly Respiratory: clear to auscultation bilaterally, no wheezing, no crackles. Normal respiratory effort. No accessory muscle use.  Cardiovascular: Unable to clearly appreciate heart sounds due to habitus , No extremity edema. 2+ pedal pulses. Abdomen: no tenderness, no masses palpated. No hepatosplenomegaly. Bowel sounds positive.  Musculoskeletal: no clubbing / cyanosis. No joint deformity upper and lower  extremities. Good ROM, no contractures. Normal muscle tone.  Skin: no rashes, lesions, ulcers. No induration Neurologic: No apparent cranial nerve abnormality, moving extremities spontaneously. Psychiatric: Normal judgment and insight. Alert and oriented x 3. Normal mood.   Labs on Admission: I have personally reviewed following labs and imaging studies  CBC: Recent Labs  Lab 06/26/21 1203  WBC 9.3  NEUTROABS 6.7  HGB 9.6*  HCT 31.0*  MCV 93.4  PLT 161   Basic Metabolic Panel: Recent Labs  Lab 06/26/21 1203  NA 139  K 3.3*  CL 100  CO2 26  GLUCOSE 86  BUN 25*  CREATININE 6.16*  CALCIUM 8.6*   Liver Function Tests: Recent Labs  Lab 06/26/21 1203  AST 23  ALT 37  ALKPHOS 114  BILITOT 0.7  PROT 7.5  ALBUMIN 4.2   Coagulation Profile: Recent Labs  Lab 06/26/21 1203  INR 1.2   Radiological Exams on Admission: DG Chest Portable 1 View  Result Date: 06/26/2021 CLINICAL DATA:  Shortness of breath EXAM: PORTABLE CHEST 1 VIEW COMPARISON:  08/03/2020 FINDINGS: Mild pulmonary vascular congestion. No pleural effusion. No pneumothorax. Cardiomegaly. IMPRESSION: Pulmonary vascular congestion.  Cardiomegaly. Electronically Signed   By: Macy Mis M.D.   On: 06/26/2021 12:10    EKG: Independently reviewed.  EKG sinus rhythm rate 78.  QTc 464.  No significant change from prior.  Assessment/Plan Principal Problem:   Acute respiratory failure with hypoxia (HCC) Active Problems:   End stage renal disease on dialysis (HCC)   Type 2 diabetes mellitus (HCC)   Essential hypertension   OSA (obstructive sleep apnea)   Acute respiratory failure with hypoxia-sats 83% on room air, EDP reports initial wheezing , resolved at the time of my exam.  CTA chest negative for PE, shows scarring or partial atelectasis of left greater than right, consistent with sequelae of prior infection or inflammation or obliterative bronchiolitis.  Afebrile without leukocytosis.  No sign of peripheral  volume overload, weight stable. -Supplemental O2 -DuoNebs scheduled and as needed -  Resume home bronchodilators -May need home O2 - ??  Significance of lung findings  Chest pain-mostly atypical, ?   No known family history of coronary artery disease.  Never smoker.  Troponins EKG unremarkable. - EKG a.m  ESRD on dialysis- MWF.  Completed session of HD today.  No signs of volume overload.  No longer takes Lasix. -Resume sevelamer and renal vitamins -If hospitalized past tomorrow may need HD.  Diastolic CHF-stable and compensated.  BNP unremarkable at 73.  Last echo 07/2020, EF 60 to 65% with indeterminate LV diastolic parameters.  No significant valvular abnormality reported.  HTN-elevated. -Resume home labetalol, nifedipine,  OSA - CPAP QHS  DM_random glucose 86. - SSI- [place] - HgbA1c -Hold home glipizide  DVT prophylaxis: Heparin Code Status: Full code Family Communication: Mother at bedside Disposition Plan:  ~ 1-2 days Consults called: None Admission status: Obs tele   Bethena Roys MD [facility]  06/26/2021, 6:12 PM

## 2021-06-26 NOTE — ED Notes (Addendum)
Removed O2 from pt. Pts. O2 is 93 RA.

## 2021-06-26 NOTE — ED Notes (Signed)
Pts. O2 dropped to 88 on RA. Placed Pt. On 2 L of O2. Pts. O2 is 100.

## 2021-06-26 NOTE — ED Notes (Signed)
Took O2 off pt. Pts. O2 dropped to 88. Placed 2 L of O2 back on pt. Pts. O2 is 100 with 2 L

## 2021-06-26 NOTE — ED Notes (Signed)
Patient transported to CT 

## 2021-06-26 NOTE — ED Provider Notes (Signed)
Emergency Department Provider Note   I have reviewed the triage vital signs and the nursing notes.   HISTORY  Chief Complaint Shortness of Breath   HPI Patricia [name] is a 50 y.o. female with past medical history reviewed below including end-stage renal disease, asthma, obstructive sleep apnea, prior breast cancer presents to the emergency department with shortness of breath and intermittent chest discomfort.  Patient describes her shortness of breath worsening over the past several days and worse with exertion.  She has occasional tightness in her chest but no active chest pain.  No pleuritic pain.  She was having symptoms over the weekend but decided to present to dialysis this morning to see if that would help.  She had a full run of dialysis with no worsening symptoms but no improved shortness of breath symptoms.  She was found to be hypoxemic on arrival and started on 2 L nasal cannula oxygen.  She does not use oxygen at baseline.  She states in the past she has been sent home with oxygen but ultimately is able to be weaned shortly after discharge.  No fevers or chills.  Denies body aches, diarrhea, vomiting.   Past Medical History:  Diagnosis Date   Anemia    Ankle fracture    Arthritis    Blood transfusion without reported diagnosis    Breast cancer ([place])    Cancer ([place])    Diabetes mellitus without complication ([place])    Dialysis patient ([place])    mon, wed, friday,    End stage renal disease on dialysis ([place])    M/W/F [facility] in [place]   GERD (gastroesophageal reflux disease)    Hypertension    OSA (obstructive sleep apnea)    uses CPAP sometimes   Pneumonia    PONV (postoperative nausea and vomiting)    Wears glasses     Patient Active Problem List   Diagnosis Date Noted   Right foot sprain 12/20/2020   Contusion of left shoulder 12/20/2020   Syncope 08/03/2020   Acute pulmonary edema ([place]) 01/11/2020   OSA (obstructive sleep apnea)    Closed right ankle  fracture 10/06/2018   Closed displaced trimalleolar fracture of right ankle 10/06/2018   Acute colitis 02/26/2018   Hypoxemia 02/24/2018   Dyspnea 11/26/2017   Anemia 11/26/2017   Elevated troponin 11/26/2017   Hyperkalemia 09/29/2017   Acute respiratory failure ([place]) 09/28/2017   CAP (community acquired pneumonia) 09/26/2017   Volume overload 09/25/2017   Acute on chronic diastolic CHF (congestive heart failure) ([place]) 09/25/2017   Lactic acidosis 09/25/2017   Hypokalemia 09/25/2017   Breast cancer ([place]) 07/22/2017   SOB (shortness of breath)    Acute respiratory failure with hypoxia ([place]) 07/21/2017   Flatulence 10/25/2016   GERD (gastroesophageal reflux disease) 08/02/2016   Pyloric stenosis, acquired    Nausea with vomiting    Generalized abdominal pain    Chest pain, rule out acute myocardial infarction 07/04/2016   Constipation 07/04/2016   Nausea 07/04/2016   Essential hypertension 07/04/2016   HCAP (healthcare-associated pneumonia) 12/04/2015   End stage renal disease on dialysis ([place]) 12/04/2015   Type 2 diabetes mellitus ([place]) 12/04/2015    Past Surgical History:  Procedure Laterality Date   ABDOMINAL HYSTERECTOMY     AV FISTULA PLACEMENT  11/2014   at [facility] N/A 07/10/2016   Procedure: BALLOON DILATION;  Surgeon: Danie Binder, MD;  [facility];  Service: Endoscopy;  Laterality: N/A;  Pyloric  dilation   BREAST LUMPECTOMY     CESAREAN SECTION     CHOLECYSTECTOMY     COLONOSCOPY WITH PROPOFOL N/A 09/27/2016   Dr. Gala Romney: Internal hemorrhoids repeat colonoscopy in 10 years   DILATION AND CURETTAGE OF UTERUS     ESOPHAGOGASTRODUODENOSCOPY N/A 07/10/2016   Dr.Fields- normal esophagus, gastric stenosis was found at the pylorus, gastritis on bx, normal examined duodenun   EXTERNAL FIXATION REMOVAL Right 10/29/2018   Procedure: REMOVAL RIGHT ANKLE BIOMET ZIMMER EXTERNAL FIXATOR, SHORT LEG CAST APPLICATION;  Surgeon: Marybelle Killings,  MD;  Location: [facility];  Service: Orthopedics;  Laterality: Right;   MASTECTOMY     left sided   ORIF ANKLE FRACTURE Right 10/06/2018   Procedure: OPEN REDUCTION INTERNAL FIXATION (ORIF) RIGHT ANKLE TRIMALLEOLAR;  Surgeon: Marybelle Killings, MD;  Location: [facility];  Service: Orthopedics;  Laterality: Right;    Allergies Amlodipine besylate and Reglan [metoclopramide]  Family History  Problem Relation Age of Onset   Diabetes Mellitus II Mother    Hypertension Mother    Hypertension Sister    Hypertension Sister    Colon cancer Neg Hx     Social History Social History   Tobacco Use   Smoking status: Never   Smokeless tobacco: Never  Vaping Use   Vaping Use: Never used  Substance Use Topics   Alcohol use: No    Alcohol/week: 0.0 standard drinks   Drug use: No    Review of Systems  Constitutional: No fever/chills Eyes: No visual changes. ENT: No sore throat. Cardiovascular: Intermittent chest pain. Respiratory: Mainly exertional shortness of breath. Gastrointestinal: No abdominal pain.  No nausea, no vomiting.  No diarrhea.  No constipation. Genitourinary: Negative for dysuria. Musculoskeletal: Negative for back pain. Skin: Negative for rash. Neurological: Negative for headaches, focal weakness or numbness.  10-point ROS otherwise negative.  ____________________________________________   PHYSICAL EXAM:  VITAL SIGNS: ED Triage Vitals  Enc Vitals Group     BP 06/26/21 1040 (!) 150/72     Pulse Rate 06/26/21 1040 76     Resp 06/26/21 1040 (!) 24     Temp 06/26/21 1053 98.7 F (37.1 C)     Temp Source 06/26/21 1053 Oral     SpO2 06/26/21 1040 96 %     Weight 06/26/21 1039 245 lb (111.1 kg)     Height 06/26/21 1039 5\' 3"  (1.6 m)   Constitutional: Alert and oriented. Well appearing and in no acute distress. Eyes: Conjunctivae are normal.  Head: Atraumatic. Nose: No congestion/rhinnorhea. Mouth/Throat: Mucous membranes are moist.  Neck: No stridor.   Cardiovascular: Normal rate, regular rhythm. Good peripheral circulation. Grossly normal heart sounds.   Respiratory: Normal respiratory effort.  No retractions. Lungs CTAB. Gastrointestinal: Soft and nontender. No distention.  Musculoskeletal: No lower extremity tenderness nor edema. No gross deformities of extremities. Neurologic:  Normal speech and language. No gross focal neurologic deficits are appreciated.  Skin:  Skin is warm, dry and intact. No rash noted.   ____________________________________________   LABS (all labs ordered are listed, but only abnormal results are displayed)  Labs Reviewed  COMPREHENSIVE METABOLIC PANEL - Abnormal; Notable for the following components:      Result Value   Potassium 3.3 (*)    BUN 25 (*)    Creatinine, Ser 6.16 (*)    Calcium 8.6 (*)    GFR, Estimated 8 (*)    All other components within normal limits  CBC WITH DIFFERENTIAL/PLATELET - Abnormal; Notable for the following components:  RBC 3.32 (*)    Hemoglobin 9.6 (*)    HCT 31.0 (*)    All other components within normal limits  CBC - Abnormal; Notable for the following components:   RBC 3.56 (*)    Hemoglobin 10.1 (*)    HCT 34.7 (*)    MCHC 29.1 (*)    All other components within normal limits  HEMOGLOBIN A1C - Abnormal; Notable for the following components:   Hgb A1c MFr Bld 8.3 (*)    All other components within normal limits  GLUCOSE, CAPILLARY - Abnormal; Notable for the following components:   Glucose-Capillary 167 (*)    All other components within normal limits  GLUCOSE, CAPILLARY - Abnormal; Notable for the following components:   Glucose-Capillary 117 (*)    All other components within normal limits  TROPONIN I (HIGH SENSITIVITY) - Abnormal; Notable for the following components:   Troponin I (High Sensitivity) 24 (*)    All other components within normal limits  TROPONIN I (HIGH SENSITIVITY) - Abnormal; Notable for the following components:   Troponin I (High  Sensitivity) 26 (*)    All other components within normal limits  RESP PANEL BY RT-PCR (FLU A&B, COVID) ARPGX2  BRAIN NATRIURETIC PEPTIDE  PROTIME-INR  HIV ANTIBODY (ROUTINE TESTING W REFLEX)  GLUCOSE, CAPILLARY   ____________________________________________  EKG   EKG Interpretation  Date/Time:  Monday June 26 2021 10:48:56 EDT Ventricular Rate:  78 PR Interval:  193 QRS Duration: 99 QT Interval:  407 QTC Calculation: 464 R Axis:   88 Text Interpretation: Sinus rhythm Confirmed by Nanda Quinton [phone]) on 06/26/2021 1:28:04 PM         ____________________________________________  RADIOLOGY  CT Angio Chest Pulmonary Embolism (PE) W or WO Contrast  Result Date: 06/26/2021 CLINICAL DATA:  Dyspnea, chest pain, hypoxia EXAM: CT ANGIOGRAPHY CHEST WITH CONTRAST TECHNIQUE: Multidetector CT imaging of the chest was performed using the standard protocol during bolus administration of intravenous contrast. Multiplanar CT image reconstructions and MIPs were obtained to evaluate the vascular anatomy. CONTRAST:  132mL OMNIPAQUE IOHEXOL 350 MG/ML SOLN COMPARISON:  12/22/2018 FINDINGS: Cardiovascular: Satisfactory opacification of the pulmonary arteries to the segmental level. No evidence of pulmonary embolism. Cardiomegaly. Left coronary artery calcifications. No pericardial effusion. Aortic atherosclerosis. Mediastinum/Nodes: Numerous prominent mediastinal lymph nodes, unchanged. Thyroid gland, trachea, and esophagus demonstrate no significant findings. Lungs/Pleura: Mosaic attenuation of the airspaces with bandlike scarring and or partial atelectasis of the left greater than right dependent lung bases. No pleural effusion or pneumothorax. Upper Abdomen: No acute abnormality. Benign adenomatous thickening of the bilateral adrenal glands. Musculoskeletal: No chest wall abnormality. No acute or significant osseous findings. Review of the MIP images confirms the above findings. IMPRESSION: 1.  Negative examination for pulmonary embolism. 2. 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 3. Cardiomegaly and coronary artery disease. Aortic Atherosclerosis (ICD10-I70.0). Electronically Signed   By: Eddie Candle M.D.   On: 06/26/2021 16:50   DG Chest Portable 1 View  Result Date: 06/26/2021 CLINICAL DATA:  Shortness of breath EXAM: PORTABLE CHEST 1 VIEW COMPARISON:  08/03/2020 FINDINGS: Mild pulmonary vascular congestion. No pleural effusion. No pneumothorax. Cardiomegaly. IMPRESSION: Pulmonary vascular congestion.  Cardiomegaly. Electronically Signed   By: Macy Mis M.D.   On: 06/26/2021 12:10    ____________________________________________   PROCEDURES  Procedure(s) performed:   Procedures  None ____________________________________________  INITIAL IMPRESSION / ASSESSMENT AND PLAN / ED COURSE  Pertinent labs & imaging results that were available during my care of the patient were reviewed by me and considered in my medical decision making (see chart for details).   Patient presents emergency department with exertional dyspnea over the past several days.  She is having some chest pain but very atypical and worse with movement.  Seems musculoskeletal.  Doubt ACS or PE.  Troponin is very mildly elevated but unchanged on delta.  Chest x-ray reviewed along with labs.  Patient remains on 2 L nasal cannula.  Gave albuterol with no improvement.  Unable to wean oxygen.  Plan for admit.   Discussed the case with nephrology who will consult in the morning and decide if additional dialysis is required.  Discussed patient's case with [facility] to request admission. Patient and family (if present) updated with plan. Care transferred to [facility] service.  I reviewed all nursing notes, vitals, pertinent old  records, EKGs, labs, imaging (as available).    ____________________________________________  FINAL CLINICAL IMPRESSION(S) / ED DIAGNOSES  Final diagnoses:  Acute respiratory failure with hypoxia ([place])     MEDICATIONS GIVEN DURING THIS VISIT:  Medications  insulin aspart (novoLOG) injection 0-6 Units (has no administration in time range)  insulin aspart (novoLOG) injection 0-5 Units (0 Units Subcutaneous Not Given 06/26/21 2209)  heparin injection 5,000 Units (5,000 Units Subcutaneous Given 06/27/21 0543)  acetaminophen (TYLENOL) tablet 650 mg (650 mg Oral Given 06/26/21 1901)    Or  acetaminophen (TYLENOL) suppository 650 mg ( Rectal See Alternative 06/26/21 1901)  ondansetron (ZOFRAN) tablet 4 mg (has no administration in time range)    Or  ondansetron (ZOFRAN) injection 4 mg (has no administration in time range)  polyethylene glycol (MIRALAX / GLYCOLAX) packet 17 g (has no administration in time range)  albuterol (PROVENTIL) (2.5 MG/3ML) 0.083% nebulizer solution 2.5 mg (has no administration in time range)  mometasone-formoterol (DULERA) 100-5 MCG/ACT inhaler 2 puff (has no administration in time range)  labetalol (NORMODYNE) tablet 200 mg (200 mg Oral Given 06/26/21 2205)  linaclotide (LINZESS) capsule 290 mcg (has no administration in time range)  multivitamin (RENA-VIT) tablet 1 tablet (1 tablet Oral Patient Refused/Not Given 06/26/21 2209)  NIFEdipine (PROCARDIA-XL/NIFEDICAL-XL) 24 hr tablet 60 mg (60 mg Oral Given 06/26/21 2204)  pantoprazole (PROTONIX) EC tablet 40 mg (40 mg Oral Given 06/26/21 2203)  rOPINIRole (REQUIP XL) 24 hr tablet 2 mg (2 mg Oral Not Given 06/26/21 2321)  sevelamer carbonate (RENVELA) tablet 1,600 mg (1,600 mg Oral Given 06/26/21 1901)  sevelamer carbonate (RENVELA) tablet 2,400 mg (has no administration in time range)  ipratropium-albuterol (DUONEB) 0.5-2.5 (3) MG/3ML nebulizer solution 3 mL (has no administration in time range)  ipratropium-albuterol  (DUONEB) 0.5-2.5 (3) MG/3ML nebulizer solution 3 mL (3 mLs Nebulization Given 06/26/21 1407)  iohexol (OMNIPAQUE) 350 MG/ML injection 100 mL (100 mLs Intravenous Contrast Given 06/26/21 1614)  rOPINIRole (REQUIP) tablet 2 mg (2 mg Oral Given 06/26/21 2321)    Note:  This document was prepared using Dragon voice recognition software and may include unintentional dictation errors.  Nanda Quinton, MD, [facility] Emergency Medicine    [place], [place], MD 06/27/21 [phone]

## 2021-06-26 NOTE — ED Notes (Signed)
Attempted report x1. 

## 2021-06-26 NOTE — ED Triage Notes (Signed)
Pt presents to ED with complaints of SOB and chest pain since Saturday.Pt c/o mid chest tightness. Pt O2 sats 83% on RA upon arrival to ED.

## 2021-06-26 NOTE — ED Notes (Signed)
ED TO INPATIENT HANDOFF REPORT  ED Nurse Name and Phone #:   S Name/Age/Gender Patricia [name] 50 y.o. female Room/Bed: APA16A/APA16A  Code Status   Code Status: Prior  Home/SNF/Other Home Patient oriented to: self, place, time and situation Is this baseline? Yes   Triage Complete: Triage complete  Chief Complaint Acute respiratory failure with hypoxia [facility]) [J96.01]  Triage Note Pt presents to ED with complaints of SOB and chest pain since Saturday.Pt c/o mid chest tightness. Pt O2 sats 83% on RA upon arrival to ED.     Allergies Allergies  Allergen Reactions  . Amlodipine Besylate Rash and Other (See Comments)    dizziness  . Reglan [Metoclopramide] Other (See Comments)    hallucinations     Level of Care/Admitting Diagnosis ED Disposition    ED Disposition  Admit   Condition  --   [place]: [facility] [[id number]]  Level of Care: Telemetry [5]  Covid Evaluation: Asymptomatic Screening Protocol (No Symptoms)  Diagnosis: Acute respiratory failure with hypoxia [facility]) [024097]  Admitting Physician: Kara Pacer  Attending Physician: Bethena Roys Nessa.Cuff         B Medical/Surgery History Past Medical History:  Diagnosis Date  . Anemia   . Ankle fracture   . Arthritis   . Blood transfusion without reported diagnosis   . Breast cancer ([place])   . Cancer ([place])   . Diabetes mellitus without complication ([place])   . Dialysis patient ([place])    mon, wed, friday,   . End stage renal disease on dialysis [facility])    M/W/F [facility] in [place]  . GERD (gastroesophageal reflux disease)   . Hypertension   . OSA (obstructive sleep apnea)    uses CPAP sometimes  . Pneumonia   . PONV (postoperative nausea and vomiting)   . Wears glasses    Past Surgical History:  Procedure Laterality Date  . ABDOMINAL HYSTERECTOMY    . AV FISTULA PLACEMENT  11/2014   at [facility] N/A 07/10/2016   Procedure: BALLOON  DILATION;  Surgeon: Danie Binder, MD;  [facility];  Service: Endoscopy;  Laterality: N/A;  Pyloric dilation  . BREAST LUMPECTOMY    . CESAREAN SECTION    . CHOLECYSTECTOMY    . COLONOSCOPY WITH PROPOFOL N/A 09/27/2016   Dr. Gala Romney: Internal hemorrhoids repeat colonoscopy in 10 years  . DILATION AND CURETTAGE OF UTERUS    . ESOPHAGOGASTRODUODENOSCOPY N/A 07/10/2016   Dr.Fields- normal esophagus, gastric stenosis was found at the pylorus, gastritis on bx, normal examined duodenun  . EXTERNAL FIXATION REMOVAL Right 10/29/2018   Procedure: REMOVAL RIGHT ANKLE BIOMET ZIMMER EXTERNAL FIXATOR, SHORT LEG CAST APPLICATION;  Surgeon: Marybelle Killings, MD;  Location: [facility];  Service: Orthopedics;  Laterality: Right;  . MASTECTOMY     left sided  . ORIF ANKLE FRACTURE Right 10/06/2018   Procedure: OPEN REDUCTION INTERNAL FIXATION (ORIF) RIGHT ANKLE TRIMALLEOLAR;  Surgeon: Marybelle Killings, MD;  Location: [employer];  Service: Orthopedics;  Laterality: Right;     A IV Location/Drains/Wounds Patient Lines/Drains/Airways Status    Active Line/Drains/Airways    Name Placement date Placement time Site Days   Peripheral IV 08/03/20 Right Hand 08/03/20  0906  Hand  327   Peripheral IV 06/26/21 20 G Right Forearm 06/26/21  1553  Forearm  less than 1   Fistula / Graft Left Upper arm --  --  Upper arm  --  Fistula / Graft Left Upper arm --  --  Upper arm  --   Closed System Drain 1 Left Breast Bulb (JP) 09/24/17  2158  Breast  1371   Closed System Drain 2 Left Breast Bulb (JP) 09/24/17  2159  Breast  1371   Incision (Closed) 09/25/17 Breast Left 09/25/17  2300  -- 1370   Incision (Closed) 10/06/18 Ankle Right 10/06/18  1605  -- 994   Incision (Closed) 10/29/18 Leg Right 10/29/18  1322  -- 971   EXTERNAL FIXATOR 10/06/18  1842  --  994          Intake/Output Last 24 hours No intake or output data in the 24 hours ending 06/26/21 1612  Labs/Imaging Results for orders placed or performed during the  hospital encounter of 06/26/21 (from the past 48 hour(s))  Resp Panel by RT-PCR (Flu A&B, Covid) Nasopharyngeal Swab     Status: None   Collection Time: 06/26/21 11:26 AM   Specimen: Nasopharyngeal Swab; Nasopharyngeal(NP) swabs in vial transport medium  Result Value Ref Range   SARS Coronavirus 2 by RT PCR NEGATIVE NEGATIVE    Comment: (NOTE) SARS-CoV-2 target nucleic acids are NOT DETECTED.  The SARS-CoV-2 RNA is generally detectable in upper respiratory specimens during the acute phase of infection. The lowest concentration of SARS-CoV-2 viral copies this assay can detect is 138 copies/mL. A negative result does not preclude SARS-Cov-2 infection and should not be used as the sole basis for treatment or other patient management decisions. A negative result may occur with  improper specimen collection/handling, submission of specimen other than nasopharyngeal swab, presence of viral mutation(s) within the areas targeted by this assay, and inadequate number of viral copies(<138 copies/mL). A negative result must be combined with clinical observations, patient history, and epidemiological information. The expected result is Negative.  Fact Sheet for Patients:  EntrepreneurPulse.com.au  Fact Sheet for Healthcare Providers:  IncredibleEmployment.be  This test is no t yet approved or cleared by the [place] FDA and  has been authorized for detection and/or diagnosis of SARS-CoV-2 by FDA under an Emergency Use Authorization (EUA). This EUA will remain  in effect (meaning this test can be used) for the duration of the COVID-19 declaration under Section 564(b)(1) of the Act, 21 U.S.C.section 360bbb-3(b)(1), unless the authorization is terminated  or revoked sooner.       Influenza A by PCR NEGATIVE NEGATIVE   Influenza B by PCR NEGATIVE NEGATIVE    Comment: (NOTE) The Xpert Xpress SARS-CoV-2/FLU/RSV plus assay is intended as an aid in the  diagnosis of influenza from Nasopharyngeal swab specimens and should not be used as a sole basis for treatment. Nasal washings and aspirates are unacceptable for Xpert Xpress SARS-CoV-2/FLU/RSV testing.  Fact Sheet for Patients: EntrepreneurPulse.com.au  Fact Sheet for Healthcare Providers: IncredibleEmployment.be  This test is not yet approved or cleared by the [place] FDA and has been authorized for detection and/or diagnosis of SARS-CoV-2 by FDA under an Emergency Use Authorization (EUA). This EUA will remain in effect (meaning this test can be used) for the duration of the COVID-19 declaration under Section 564(b)(1) of the Act, 21 U.S.C. section 360bbb-3(b)(1), unless the authorization is terminated or revoked.  Performed at [facility], [address] [place], [place] [address]   Comprehensive metabolic panel     Status: Abnormal   Collection Time: 06/26/21 12:03 PM  Result Value Ref Range   Sodium 139 135 - 145 mmol/L   Potassium 3.3 (L) 3.5 - 5.1  mmol/L   Chloride 100 98 - 111 mmol/L   CO2 26 22 - 32 mmol/L   Glucose, Bld 86 70 - 99 mg/dL    Comment: Glucose reference range applies only to samples taken after fasting for at least 8 hours.   BUN 25 (H) 6 - 20 mg/dL   Creatinine, Ser 6.16 (H) 0.44 - 1.00 mg/dL   Calcium 8.6 (L) 8.9 - 10.3 mg/dL   Total Protein 7.5 6.5 - 8.1 g/dL   Albumin 4.2 3.5 - 5.0 g/dL   AST 23 15 - 41 U/L   ALT 37 0 - 44 U/L   Alkaline Phosphatase 114 38 - 126 U/L   Total Bilirubin 0.7 0.3 - 1.2 mg/dL   GFR, Estimated 8 (L) >60 mL/min    Comment: (NOTE) Calculated using the CKD-EPI Creatinine Equation (2021)    Anion gap 13 5 - 15    Comment: Performed at [facility], [address] [place], [place] [address]  Brain natriuretic peptide     Status: None   Collection Time: 06/26/21 12:03 PM  Result Value Ref Range   B Natriuretic Peptide 73.0 0.0 - 100.0 pg/mL    Comment: Performed at [facility], [address] [place], [place] [address]  Troponin I (High Sensitivity)     Status: Abnormal   Collection Time: 06/26/21 12:03 PM  Result Value Ref Range   Troponin I (High Sensitivity) 24 (H) <18 ng/L    Comment: (NOTE) Elevated high sensitivity troponin I (hsTnI) values and significant  changes across serial measurements may suggest ACS but many other  chronic and acute conditions are known to elevate hsTnI results.  Refer to the "Links" section for chest pain algorithms and additional  guidance. Performed at [facility], [address] [place], [place] [address]   CBC with Differential     Status: Abnormal   Collection Time: 06/26/21 12:03 PM  Result Value Ref Range   WBC 9.3 4.0 - 10.5 K/uL   RBC 3.32 (L) 3.87 - 5.11 MIL/uL   Hemoglobin 9.6 (L) 12.0 - 15.0 g/dL   HCT 31.0 (L) 36.0 - 46.0 %   MCV 93.4 80.0 - 100.0 fL   MCH 28.9 26.0 - 34.0 pg   MCHC 31.0 30.0 - 36.0 g/dL   RDW 15.1 11.5 - 15.5 %   Platelets 254 150 - 400 K/uL   nRBC 0.0 0.0 - 0.2 %   Neutrophils Relative % 72 %   Neutro Abs 6.7 1.7 - 7.7 K/uL   Lymphocytes Relative 20 %   Lymphs Abs 1.9 0.7 - 4.0 K/uL   Monocytes Relative 6 %   Monocytes Absolute 0.5 0.1 - 1.0 K/uL   Eosinophils Relative 2 %   Eosinophils Absolute 0.2 0.0 - 0.5 K/uL   Basophils Relative 0 %   Basophils Absolute 0.0 0.0 - 0.1 K/uL   Immature Granulocytes 0 %   Abs Immature Granulocytes 0.04 0.00 - 0.07 K/uL    Comment: Performed at [facility], [address], [place], [place] [address]  Protime-INR     Status: None   Collection Time: 06/26/21 12:03 PM  Result Value Ref Range   Prothrombin Time 15.2 11.4 - 15.2 seconds   INR 1.2 0.8 - 1.2    Comment: (NOTE) INR goal varies based on device and disease states. Performed at [facility], [address] [place], [place] [address]   Troponin I (High Sensitivity)     Status: Abnormal   Collection Time:  06/26/21  1:29 PM  Result Value Ref Range   Troponin I (High Sensitivity) 26 (H)  <18 ng/L    Comment: (NOTE) Elevated high sensitivity troponin I (hsTnI) values and significant  changes across serial measurements may suggest ACS but many other  chronic and acute conditions are known to elevate hsTnI results.  Refer to the "Links" section for chest pain algorithms and additional  guidance. Performed at [facility], [address], [place], [place] [address]    DG Chest Portable 1 View  Result Date: 06/26/2021 CLINICAL DATA:  Shortness of breath EXAM: PORTABLE CHEST 1 VIEW COMPARISON:  08/03/2020 FINDINGS: Mild pulmonary vascular congestion. No pleural effusion. No pneumothorax. Cardiomegaly. IMPRESSION: Pulmonary vascular congestion.  Cardiomegaly. Electronically Signed   By: Macy Mis M.D.   On: 06/26/2021 12:10    Pending Labs Unresulted Labs (From admission, onward)   None      Vitals/Pain Today's Vitals   06/26/21 1502 06/26/21 1503 06/26/21 1519 06/26/21 1530  BP:  (!) 157/90  (!) 187/86  Pulse: 73 76  76  Resp: 16 (!) 23    Temp:      TempSrc:      SpO2: 98% 98%  98%  Weight:      Height:      PainSc:   7      Isolation Precautions No active isolations  Medications Medications  iohexol (OMNIPAQUE) 350 MG/ML injection 100 mL (has no administration in time range)  ipratropium-albuterol (DUONEB) 0.5-2.5 (3) MG/3ML nebulizer solution 3 mL (3 mLs Nebulization Given 06/26/21 1407)    Mobility walks Low fall risk   Focused Assessments    R Recommendations: See Admitting Provider Note  Report given to:   Additional Notes:

## 2021-06-27 ENCOUNTER — Encounter (HOSPITAL_COMMUNITY): Payer: Self-pay | Admitting: Internal Medicine

## 2021-06-27 ENCOUNTER — Observation Stay (HOSPITAL_COMMUNITY): Payer: Medicare Other

## 2021-06-27 ENCOUNTER — Observation Stay (HOSPITAL_BASED_OUTPATIENT_CLINIC_OR_DEPARTMENT_OTHER): Payer: Medicare Other

## 2021-06-27 DIAGNOSIS — R0609 Other forms of dyspnea: Secondary | ICD-10-CM

## 2021-06-27 DIAGNOSIS — U099 Post covid-19 condition, unspecified: Secondary | ICD-10-CM | POA: Diagnosis not present

## 2021-06-27 DIAGNOSIS — D631 Anemia in chronic kidney disease: Secondary | ICD-10-CM | POA: Diagnosis not present

## 2021-06-27 DIAGNOSIS — Z992 Dependence on renal dialysis: Secondary | ICD-10-CM

## 2021-06-27 DIAGNOSIS — I5032 Chronic diastolic (congestive) heart failure: Secondary | ICD-10-CM | POA: Diagnosis not present

## 2021-06-27 DIAGNOSIS — R0602 Shortness of breath: Secondary | ICD-10-CM | POA: Diagnosis not present

## 2021-06-27 DIAGNOSIS — I5033 Acute on chronic diastolic (congestive) heart failure: Secondary | ICD-10-CM | POA: Diagnosis not present

## 2021-06-27 DIAGNOSIS — Z794 Long term (current) use of insulin: Secondary | ICD-10-CM | POA: Diagnosis not present

## 2021-06-27 DIAGNOSIS — I10 Essential (primary) hypertension: Secondary | ICD-10-CM

## 2021-06-27 DIAGNOSIS — N186 End stage renal disease: Secondary | ICD-10-CM

## 2021-06-27 DIAGNOSIS — Z853 Personal history of malignant neoplasm of breast: Secondary | ICD-10-CM | POA: Diagnosis not present

## 2021-06-27 DIAGNOSIS — N250 Renal osteodystrophy: Secondary | ICD-10-CM | POA: Diagnosis not present

## 2021-06-27 DIAGNOSIS — J329 Chronic sinusitis, unspecified: Secondary | ICD-10-CM | POA: Diagnosis not present

## 2021-06-27 DIAGNOSIS — J45909 Unspecified asthma, uncomplicated: Secondary | ICD-10-CM | POA: Diagnosis not present

## 2021-06-27 DIAGNOSIS — E1122 Type 2 diabetes mellitus with diabetic chronic kidney disease: Secondary | ICD-10-CM

## 2021-06-27 DIAGNOSIS — I132 Hypertensive heart and chronic kidney disease with heart failure and with stage 5 chronic kidney disease, or end stage renal disease: Secondary | ICD-10-CM | POA: Diagnosis not present

## 2021-06-27 DIAGNOSIS — E042 Nontoxic multinodular goiter: Secondary | ICD-10-CM | POA: Diagnosis not present

## 2021-06-27 DIAGNOSIS — Z79899 Other long term (current) drug therapy: Secondary | ICD-10-CM | POA: Diagnosis not present

## 2021-06-27 DIAGNOSIS — G4733 Obstructive sleep apnea (adult) (pediatric): Secondary | ICD-10-CM

## 2021-06-27 DIAGNOSIS — J9601 Acute respiratory failure with hypoxia: Secondary | ICD-10-CM

## 2021-06-27 DIAGNOSIS — J8410 Pulmonary fibrosis, unspecified: Secondary | ICD-10-CM | POA: Diagnosis not present

## 2021-06-27 DIAGNOSIS — Z20822 Contact with and (suspected) exposure to covid-19: Secondary | ICD-10-CM | POA: Diagnosis not present

## 2021-06-27 LAB — GLUCOSE, CAPILLARY
Glucose-Capillary: 117 mg/dL — ABNORMAL HIGH (ref 70–99)
Glucose-Capillary: 175 mg/dL — ABNORMAL HIGH (ref 70–99)

## 2021-06-27 LAB — ECHOCARDIOGRAM COMPLETE
AR max vel: 2.05 cm2
AV Area VTI: 2.04 cm2
AV Area mean vel: 1.85 cm2
AV Mean grad: 7 mmHg
AV Peak grad: 13.5 mmHg
Ao pk vel: 1.84 m/s
Area-P 1/2: 2.74 cm2
Height: 63 in
MV VTI: 1.68 cm2
S' Lateral: 3.32 cm
Weight: 3950.64 oz

## 2021-06-27 LAB — CBC
HCT: 34.7 % — ABNORMAL LOW (ref 36.0–46.0)
Hemoglobin: 10.1 g/dL — ABNORMAL LOW (ref 12.0–15.0)
MCH: 28.4 pg (ref 26.0–34.0)
MCHC: 29.1 g/dL — ABNORMAL LOW (ref 30.0–36.0)
MCV: 97.5 fL (ref 80.0–100.0)
Platelets: 261 10*3/uL (ref 150–400)
RBC: 3.56 MIL/uL — ABNORMAL LOW (ref 3.87–5.11)
RDW: 15 % (ref 11.5–15.5)
WBC: 9.1 10*3/uL (ref 4.0–10.5)
nRBC: 0 % (ref 0.0–0.2)

## 2021-06-27 LAB — RENAL FUNCTION PANEL
Albumin: 3.9 g/dL (ref 3.5–5.0)
Anion gap: 12 (ref 5–15)
BUN: 41 mg/dL — ABNORMAL HIGH (ref 6–20)
CO2: 23 mmol/L (ref 22–32)
Calcium: 8.9 mg/dL (ref 8.9–10.3)
Chloride: 100 mmol/L (ref 98–111)
Creatinine, Ser: 9.68 mg/dL — ABNORMAL HIGH (ref 0.44–1.00)
GFR, Estimated: 4 mL/min — ABNORMAL LOW (ref >60)
Glucose, Bld: 176 mg/dL — ABNORMAL HIGH (ref 70–99)
Phosphorus: 5.3 mg/dL — ABNORMAL HIGH (ref 2.5–4.6)
Potassium: 4.1 mmol/L (ref 3.5–5.1)
Sodium: 135 mmol/L (ref 135–145)

## 2021-06-27 MED ORDER — HEPARIN SODIUM (PORCINE) 1000 UNIT/ML DIALYSIS: 20.0000 [IU]/kg | INTRAMUSCULAR | Status: DC | PRN

## 2021-06-27 MED ORDER — LIDOCAINE-PRILOCAINE 2.5-2.5 % EX CREA: 1.0000 "application " | TOPICAL_CREAM | CUTANEOUS | Status: DC | PRN

## 2021-06-27 MED ORDER — CHLORHEXIDINE GLUCONATE CLOTH 2 % EX PADS
6.0000 | MEDICATED_PAD | Freq: Every day | CUTANEOUS | Status: DC
  Administered 2021-06-27 – 2021-06-28 (×2): 6 via TOPICAL

## 2021-06-27 MED ORDER — SODIUM CHLORIDE 0.9 % IV SOLN: 100.0000 mL | INTRAVENOUS | Status: DC | PRN

## 2021-06-27 MED ORDER — ROPINIROLE HCL 1 MG PO TABS
1.0000 mg | ORAL_TABLET | Freq: Two times a day (BID) | ORAL | Status: DC
  Administered 2021-06-27 – 2021-06-28 (×3): 1 mg via ORAL
  Filled 2021-06-27 (×3): qty 1

## 2021-06-27 MED ORDER — PENTAFLUOROPROP-TETRAFLUOROETH EX AERO: 1.0000 "application " | INHALATION_SPRAY | CUTANEOUS | Status: DC | PRN

## 2021-06-27 MED ORDER — BISOPROLOL FUMARATE 5 MG PO TABS
5.0000 mg | ORAL_TABLET | Freq: Two times a day (BID) | ORAL | Status: DC
  Administered 2021-06-27 – 2021-06-28 (×2): 5 mg via ORAL
  Filled 2021-06-27 (×2): qty 1

## 2021-06-27 MED ORDER — PANTOPRAZOLE SODIUM 40 MG PO TBEC
40.0000 mg | DELAYED_RELEASE_TABLET | Freq: Two times a day (BID) | ORAL | Status: DC
  Administered 2021-06-27 – 2021-06-28 (×2): 40 mg via ORAL
  Filled 2021-06-27 (×2): qty 1

## 2021-06-27 MED ORDER — LIDOCAINE HCL (PF) 1 % IJ SOLN: 5.0000 mL | INTRAMUSCULAR | Status: DC | PRN

## 2021-06-27 MED ORDER — HEPARIN SODIUM (PORCINE) 1000 UNIT/ML IJ SOLN
INTRAMUSCULAR | Status: AC
  Filled 2021-06-27: qty 2

## 2021-06-27 NOTE — Progress Notes (Signed)
IS instructed and given to patient. Pt had good effort of 10 attempts of 1100-1200.  Pt refusing to wear CPAP tonight. O2 increased to 3lpm cann due to low spo2 of 88 on 2lpm cann. Rt will continue to monitor.

## 2021-06-27 NOTE — Consult Note (Signed)
[facility] Renal Consultation Note    Indication for Consultation:  Management of ESRD/hemodialysis; anemia, hypertension/volume and secondary hyperparathyroidism  HPI: Patricia [name] is a 50 y.o. female with a PMH significant for DM, HTN, chronic diastolic CHF, morbid obesity, asthma, ESRD, and diagnosis of covid-19 infection since 12/13/19 who presented to [facility] ED after HD yesterday with worsening SOB and DOE.  She reports that this has been going on since May but was worse over the weekend and has to use her "emergency inhaler" daily.  She completed her full treatment of HD yesterday and reached her target weight but continued to have SOB and DOE.  In the ED she was hypoxic with SpO2 of 83% on room air, CXR with pulmonary vascular congestion, BNP 73, repiratory panel was negative, and CT angio negative for PE.  We were consulted to provide HD during her hospitalization as well as to have extra HD session to see if more volume removal will improve her oxygenation as well as her SOB/DOE.    She denies any lower extremity edema and is complaint with dialysis.  She denies any hemoptysis or productive cough.  Past Medical History:  Diagnosis Date   Anemia    Ankle fracture    Arthritis    Blood transfusion without reported diagnosis    Breast cancer ([place])    Cancer ([place])    Diabetes mellitus without complication ([place])    Dialysis patient ([place])    mon, wed, friday,    End stage renal disease on dialysis ([place])    M/W/F [facility] in [place]   GERD (gastroesophageal reflux disease)    Hypertension    OSA (obstructive sleep apnea)    uses CPAP sometimes   Pneumonia    PONV (postoperative nausea and vomiting)    Wears glasses    Past Surgical History:  Procedure Laterality Date   ABDOMINAL HYSTERECTOMY     AV FISTULA PLACEMENT  11/2014   at [place] N/A 07/10/2016   Procedure: BALLOON DILATION;  Surgeon: Danie Binder, MD;  Location: [facility];   Service: Endoscopy;  Laterality: N/A;  Pyloric dilation   BREAST LUMPECTOMY     CESAREAN SECTION     CHOLECYSTECTOMY     COLONOSCOPY WITH PROPOFOL N/A 09/27/2016   Dr. Gala Romney: Internal hemorrhoids repeat colonoscopy in 10 years   DILATION AND CURETTAGE OF UTERUS     ESOPHAGOGASTRODUODENOSCOPY N/A 07/10/2016   Dr.Fields- normal esophagus, gastric stenosis was found at the pylorus, gastritis on bx, normal examined duodenun   EXTERNAL FIXATION REMOVAL Right 10/29/2018   Procedure: REMOVAL RIGHT ANKLE BIOMET ZIMMER EXTERNAL FIXATOR, SHORT LEG CAST APPLICATION;  Surgeon: Marybelle Killings, MD;  Location: [place];  Service: Orthopedics;  Laterality: Right;   MASTECTOMY     left sided   ORIF ANKLE FRACTURE Right 10/06/2018   Procedure: OPEN REDUCTION INTERNAL FIXATION (ORIF) RIGHT ANKLE TRIMALLEOLAR;  Surgeon: Marybelle Killings, MD;  Location: [place];  Service: Orthopedics;  Laterality: Right;   Family History:   Family History  Problem Relation Age of Onset   Diabetes Mellitus II Mother    Hypertension Mother    Hypertension Sister    Hypertension Sister    Colon cancer Neg Hx    Social History:  reports that she has never smoked. She has never used smokeless tobacco. She reports that she does not drink alcohol and does not use drugs. Allergies  Allergen Reactions   Amlodipine Besylate Rash and  Other (See Comments)    dizziness   Reglan [Metoclopramide] Other (See Comments)    hallucinations    Prior to Admission medications   Medication Sig Start Date End Date Taking? Authorizing Provider  acetaminophen (TYLENOL) 325 MG tablet Take 650 mg by mouth every 6 (six) hours as needed.   Yes [provider]  albuterol (PROVENTIL HFA;VENTOLIN HFA) 108 (90 Base) MCG/ACT inhaler Inhale 2 puffs into the lungs every 6 (six) hours as needed for wheezing or shortness of breath. 11/28/17  Yes Kathie Dike, MD  albuterol (PROVENTIL) (2.5 MG/3ML) 0.083% nebulizer solution Take 3 mLs (2.5 mg total) by  nebulization every 6 (six) hours as needed for wheezing or shortness of breath. 11/28/17  Yes Kathie Dike, MD  budesonide-formoterol (SYMBICORT) 80-4.5 MCG/ACT inhaler Inhale 2 puffs into the lungs 2 (two) times daily.   Yes [provider]  cinacalcet (SENSIPAR) 60 MG tablet Take 30 mg by mouth daily with breakfast.   Yes [provider]  dicyclomine (BENTYL) 10 MG capsule Take 10 mg by mouth 2 (two) times daily as needed. 10/06/19  Yes [provider]  ferric citrate (AURYXIA) 1 GM 210 MG(Fe) tablet Take 2 tablets by mouth 2 (two) times daily with a meal. With Breakfast & with supper   Yes [provider]  FLOVENT HFA 110 MCG/ACT inhaler 1 puff 2 (two) times daily. 12/04/19  Yes [provider]  glipiZIDE (GLUCOTROL) 5 MG tablet Take 5 mg by mouth 2 (two) times daily as needed. For blood sugar levels over 150   Yes [provider]  ipratropium (ATROVENT) 0.03 % nasal spray Place 2 sprays into both nostrils 2 (two) times daily.  06/06/19  Yes [provider]  labetalol (NORMODYNE) 200 MG tablet Take 200 mg by mouth 2 (two) times daily. 06/23/21  Yes [provider]  linaclotide (LINZESS) 290 MCG CAPS capsule Take 290 mcg by mouth daily before breakfast.   Yes [provider]  multivitamin (RENA-VIT) TABS tablet Take 1 tablet by mouth daily.   Yes [provider]  NIFEdipine (ADALAT CC) 60 MG 24 hr tablet Take 1 tablet (60 mg total) by mouth every evening. 01/02/20  Yes Hongalgi, Lenis Dickinson, MD  ondansetron (ZOFRAN-ODT) 4 MG disintegrating tablet Take 4 mg by mouth as needed for nausea or vomiting.    Yes [provider]  pantoprazole (PROTONIX) 40 MG tablet Take 1 tablet (40 mg total) by mouth daily. 30 minutes before breakfast 02/16/21  Yes Annitta Needs, NP  rOPINIRole (REQUIP XL) 2 MG 24 hr tablet Take 2 mg by mouth at bedtime.  09/10/16  Yes [provider]  sevelamer carbonate (RENVELA) 800 MG  tablet Take 800 mg by mouth See admin instructions. Take 2 tablets (1600 mg) in the morning, take 3 tablets (2400 mg) with lunch or snack,  and take 2 tablets (1600 mg) by mouth in the evening with dinner meal.   Yes [provider]  bisacodyl (DULCOLAX) 5 MG EC tablet Take 5 mg by mouth daily as needed. Patient not taking: Reported on 06/26/2021    [provider]  docusate sodium (COLACE) 100 MG capsule Take 100 mg by mouth daily as needed. Patient not taking: Reported on 06/26/2021    [provider]  furosemide (LASIX) 40 MG tablet Take 40 mg by mouth 3 (three) times daily. Patient not taking: No sig reported 11/10/20   [provider]  sodium chloride (OCEAN) 0.65 % SOLN nasal spray Place  1 spray into both nostrils as needed for congestion. Patient not taking: No sig reported 01/02/20   Modena Jansky, MD   Current Facility-Administered Medications  Medication Dose Route Frequency Provider Last Rate Last Admin   acetaminophen (TYLENOL) tablet 650 mg  650 mg Oral Q6H PRN Emokpae, Ejiroghene E, MD   650 mg at 06/26/21 1901   Or   acetaminophen (TYLENOL) suppository 650 mg  650 mg Rectal Q6H PRN Emokpae, Ejiroghene E, MD       albuterol (PROVENTIL) (2.5 MG/3ML) 0.083% nebulizer solution 2.5 mg  2.5 mg Nebulization Q4H PRN Emokpae, Ejiroghene E, MD       Chlorhexidine Gluconate Cloth 2 % PADS 6 each  6 each Topical Q0600 Donato Heinz, MD       heparin injection 5,000 Units  5,000 Units Subcutaneous Q8H Emokpae, Ejiroghene E, MD   5,000 Units at 06/27/21 0543   insulin aspart (novoLOG) injection 0-5 Units  0-5 Units Subcutaneous QHS Emokpae, Ejiroghene E, MD       insulin aspart (novoLOG) injection 0-6 Units  0-6 Units Subcutaneous TID WC Emokpae, Ejiroghene E, MD       ipratropium-albuterol (DUONEB) 0.5-2.5 (3) MG/3ML nebulizer solution 3 mL  3 mL Nebulization TID Emokpae, Ejiroghene E, MD   3 mL at 06/27/21 0834   labetalol (NORMODYNE) tablet 200 mg   200 mg Oral BID Emokpae, Ejiroghene E, MD   200 mg at 06/27/21 0800   linaclotide (LINZESS) capsule 290 mcg  290 mcg Oral QAC breakfast Emokpae, Ejiroghene E, MD   290 mcg at 06/27/21 0757   mometasone-formoterol (DULERA) 100-5 MCG/ACT inhaler 2 puff  2 puff Inhalation BID Emokpae, Ejiroghene E, MD   2 puff at 06/27/21 0837   multivitamin (RENA-VIT) tablet 1 tablet  1 tablet Oral Daily Emokpae, Ejiroghene E, MD   1 tablet at 06/27/21 0800   NIFEdipine (PROCARDIA-XL/NIFEDICAL-XL) 24 hr tablet 60 mg  60 mg Oral QPM Emokpae, Ejiroghene E, MD   60 mg at 06/26/21 2204   ondansetron (ZOFRAN) tablet 4 mg  4 mg Oral Q6H PRN Emokpae, Ejiroghene E, MD       Or   ondansetron (ZOFRAN) injection 4 mg  4 mg Intravenous Q6H PRN Emokpae, Ejiroghene E, MD       pantoprazole (PROTONIX) EC tablet 40 mg  40 mg Oral Daily Emokpae, Ejiroghene E, MD   40 mg at 06/27/21 0800   polyethylene glycol (MIRALAX / GLYCOLAX) packet 17 g  17 g Oral Daily PRN Emokpae, Ejiroghene E, MD       rOPINIRole (REQUIP) tablet 1 mg  1 mg Oral BID Johnson, Clanford L, MD       sevelamer carbonate (RENVELA) tablet 1,600 mg  1,600 mg Oral BID WC Emokpae, Ejiroghene E, MD   1,600 mg at 06/27/21 0758   sevelamer carbonate (RENVELA) tablet 2,400 mg  2,400 mg Oral Q lunch Emokpae, Ejiroghene E, MD       Labs: Basic Metabolic Panel: Recent Labs  Lab 06/26/21 1203  NA 139  K 3.3*  CL 100  CO2 26  GLUCOSE 86  BUN 25*  CREATININE 6.16*  CALCIUM 8.6*   Liver Function Tests: Recent Labs  Lab 06/26/21 1203  AST 23  ALT 37  ALKPHOS 114  BILITOT 0.7  PROT 7.5  ALBUMIN 4.2   No results for input(s): LIPASE, AMYLASE in the last 168 hours. No results for input(s): AMMONIA in the last 168 hours. CBC: Recent Labs  Lab 06/26/21 1203  06/27/21 0426  WBC 9.3 9.1  NEUTROABS 6.7  --   HGB 9.6* 10.1*  HCT 31.0* 34.7*  MCV 93.4 97.5  PLT 254 261   Cardiac Enzymes: No results for input(s): CKTOTAL, CKMB, CKMBINDEX, TROPONINI in the  last 168 hours. CBG: Recent Labs  Lab 06/26/21 1742 06/26/21 2107 06/27/21 0710  GLUCAP 81 167* 117*   Iron Studies: No results for input(s): IRON, TIBC, TRANSFERRIN, FERRITIN in the last 72 hours. Studies/Results: CT Angio Chest Pulmonary Embolism (PE) W or WO Contrast  Result Date: 06/26/2021 CLINICAL DATA:  Dyspnea, chest pain, hypoxia EXAM: CT ANGIOGRAPHY CHEST WITH CONTRAST TECHNIQUE: Multidetector CT imaging of the chest was performed using the standard protocol during bolus administration of intravenous contrast. Multiplanar CT image reconstructions and MIPs were obtained to evaluate the vascular anatomy. CONTRAST:  176mL OMNIPAQUE IOHEXOL 350 MG/ML SOLN COMPARISON:  12/22/2018 FINDINGS: Cardiovascular: Satisfactory opacification of the pulmonary arteries to the segmental level. No evidence of pulmonary embolism. Cardiomegaly. Left coronary artery calcifications. No pericardial effusion. Aortic atherosclerosis. Mediastinum/Nodes: Numerous prominent mediastinal lymph nodes, unchanged. Thyroid gland, trachea, and esophagus demonstrate no significant findings. Lungs/Pleura: Mosaic attenuation of the airspaces with bandlike scarring and or partial atelectasis of the left greater than right dependent lung bases. No pleural effusion or pneumothorax. Upper Abdomen: No acute abnormality. Benign adenomatous thickening of the bilateral adrenal glands. Musculoskeletal: No chest wall abnormality. No acute or significant osseous findings. Review of the MIP images confirms the above findings. IMPRESSION: 1. Negative examination for pulmonary embolism. 2. 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 3. Cardiomegaly and coronary artery disease. Aortic Atherosclerosis (ICD10-I70.0). Electronically Signed   By: Eddie Candle M.D.   On: 06/26/2021 16:50   DG Chest Portable 1 View  Result Date: 06/26/2021 CLINICAL DATA:  Shortness of breath EXAM: PORTABLE CHEST 1 VIEW COMPARISON:  08/03/2020 FINDINGS: Mild pulmonary vascular congestion. No pleural effusion. No pneumothorax. Cardiomegaly. IMPRESSION: Pulmonary vascular congestion.  Cardiomegaly. Electronically Signed   By: Macy Mis M.D.   On: 06/26/2021 12:10    ROS: Pertinent items are noted in HPI. Physical Exam: Vitals:   06/26/21 2211 06/27/21 0545 06/27/21 0836 06/27/21 0838  BP:  (!) 162/85    Pulse:  71    Resp:      Temp:  98.2 F (36.8 C)    TempSrc:  Oral    SpO2: 92% 95% 91% 96%  Weight:      Height:          Weight change:   Intake/Output Summary (Last 24 hours) at 06/27/2021 1002 Last data filed at 06/27/2021 0900 Gross per 24 hour  Intake 360 ml  Output --  Net 360 ml   BP (!) 162/85   Pulse 71   Temp 98.2 F (36.8 C) (Oral)   Resp 20   Ht 5\' 3"  (1.6 m)   Wt 111.1 kg   SpO2 96%   BMI 43.40 kg/m  General appearance: alert, cooperative, mild distress, and morbidly obese Head: Normocephalic, without obvious abnormality, atraumatic Eyes: negative findings: lids and lashes normal, conjunctivae and sclerae normal, and corneas clear Resp: clear to auscultation bilaterally Cardio: regular rate and rhythm, S1, S2 normal, no murmur, click, rub or gallop GI: soft, non-tender; bowel sounds normal; no masses,  no organomegaly Extremities: extremities normal, atraumatic, no cyanosis or edema and LUE AVF +  T/B with multiple aneurysmal changes Dialysis Access: LUE AVF   Dialysis Orders: Outpt HD: [facility] MWF Na modeling:  linear 148. Time: 4h 22min   BFR/DFR: 400/600   EDW: 111 kg   Hep 1000+ 1800/hr  2/2.5 bath L AVF EPO 3600   Units IV/HD  Hectoral 12 mcg IVP with HD  Assessment/Plan:  Acute hypoxic respiratory failure - unclear if this is volume related or primary pulmonary issue.  She does have asthma and reports  that she has trouble with hot, humid weather.  She got to her target edw with HD and completed a full treatment.  Will plan for HD again today and UF 2-3 liters as tolerated, however if this does not improve her hypoxia, would recommend Pulmonary consultation.  ESRD -  as above, normally MWF but will plan for extra session today.  Hypertension/volume  - as above  Anemia  - stable on ESA  Metabolic bone disease -  continue with home meds  Nutrition -  renal diet, carb modified. Diastolic CHF - no peripheral edema and low BNP. OSA - continue with CPAP qhs DM - per primary  Donetta Potts, MD [place] Pager [phone] 06/27/2021, 10:02 AM

## 2021-06-27 NOTE — Progress Notes (Signed)
EKG done and given to nurse 

## 2021-06-27 NOTE — Progress Notes (Signed)
  Echocardiogram 2D Echocardiogram has been performed.  Patricia [name] 06/27/2021, 3:39 PM

## 2021-06-27 NOTE — Consult Note (Addendum)
[name], MRN:  [id number], DOB:  28-Jul-1971, LOS: 0 ADMISSION DATE:  06/26/2021, CONSULTATION DATE:  06/27/21 REFERRING MD:  Wynetta Emery, [facility], CHIEF COMPLAINT:  sob    History of Present Illness:  51  yobf never smoker  with medical history significant for  ESRD, DM, HTN, Diastolic CHF and GDJME-26 infection 12/13/19.  Patient presented to ED with complaints of difficulty breathing on exertion and chest pain.  Patient reports difficulty breathing has been ongoing since she had a revision of her HD access fistula back in April/may 202  ( No details in Epic/care everywhere). She reports she unable to walk 10 feet without getting short of breath.  She reports improvement in her symptoms with her inhaler.  She also reports nonradiating, central and intermittent chest pain, that is provoked mostly by movement and coughing.  She reports occasional chest pains with exertion. No change in weight, no leg swelling.  She has not been very active due to right ankle problems and difficulty breathing.  2 days PTA symptoms significantly worsen, she waited to today to get dialyzed taking it to help with her symptoms, but with persistence of symptoms despite completing full session of dialysis patient presented to the ED. She has been on dialysis since 2015, and makes small amount of urine.   ED Course: O2 sats 83% on room air, currently on 2.5 L sats greater than 96%.  Blood pressure 150s to 170s.  Heart rate 70s.  Troponins 24 > 26.  Unremarkable CBC, potassium 3.3.  BNP 73.  Chest x-ray showed pulmonary vascular congestion.     Additional hx obtained 7/12;  2 weeks of progressive doe assoc with nasal congestion s purulent secretions and dyphagia/ choking sensation esp with eating rice.  No fever or abd pain or significant new leg swellling.  Not really better p saba or HD while on labetolol as outpt.  (Got sob car to HD waiting area, took saba, same problem w/in 30 min walking waiting area to HD and better  again after rested, this time s saba, in same amt of time and same symptom of doe, also same doe p HD.      Significant Hospital Events: Including procedures, antibiotic start and stop dates in addition to other pertinent events   Echo 7/12 >>>  Scheduled Meds:  Chlorhexidine Gluconate Cloth  6 each Topical Q0600   heparin  5,000 Units Subcutaneous Q8H   insulin aspart  0-5 Units Subcutaneous QHS   insulin aspart  0-6 Units Subcutaneous TID WC   ipratropium-albuterol  3 mL Nebulization TID   labetalol  200 mg Oral BID   linaclotide  290 mcg Oral QAC breakfast   mometasone-formoterol  2 puff Inhalation BID   multivitamin  1 tablet Oral Daily   NIFEdipine  60 mg Oral QPM   pantoprazole  40 mg Oral Daily   rOPINIRole  1 mg Oral BID   sevelamer carbonate  1,600 mg Oral BID WC   sevelamer carbonate  2,400 mg Oral Q lunch   Continuous Infusions:  sodium chloride     sodium chloride     PRN Meds:.sodium chloride, sodium chloride, acetaminophen **OR** acetaminophen, albuterol, heparin, lidocaine (PF), lidocaine-prilocaine, ondansetron **OR** ondansetron (ZOFRAN) IV, pentafluoroprop-tetrafluoroeth, polyethylene glycol     Interim History / Subjective:  Frustrated still sob x 10 ft vs baseline vs says was able to do "[employer]"  2 weeks PTA s 02 or any resp rx   Objective  Blood pressure (!) 162/85, pulse 71, temperature 98.2 F (36.8 C), temperature source Oral, resp. rate 20, height 5\' 3"  (1.6 m), weight 111.1 kg, SpO2 96 %.        Intake/Output Summary (Last 24 hours) at 06/27/2021 1320 Last data filed at 06/27/2021 0900 Gross per 24 hour  Intake 360 ml  Output --  Net 360 ml   Filed Weights   06/26/21 1039  Weight: 111.1 kg    Examination: Tmax  98.7   Note at rest 02 sats  96% on 2lpm    General appearance:    obese bf nad at 30 degreees  General: alert/approp HENT: neg Lungs: clear to A and P Cardiovascular: RRR no s3 Abdomen: obese/ poor  excursion Extremities: R > L swelling, trace pitting  Neuro: intact      I personally reviewed images and agree with radiology impression as follows:  CTa chest 06/26/21 1. Negative examination for pulmonary embolism. 2. 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 3. Cardiomegaly and coronary artery disease.      Assessment & Plan:  1) Unexplained doe/ hypoxemia with main previous findings c/w obesity/ restriction, not obstruction  DDX of  difficult airways management almost all start with A and  include Adherence, Ace Inhibitors, Acid Reflux, Active Sinus Disease, Alpha 1 Antitripsin deficiency, Anxiety masquerading as Airways dz,  ABPA,  Allergy(esp in young), Aspiration (esp in elderly), Adverse effects of meds,  Active smoking or vaping, A bunch of PE's (a small clot burden can't cause this syndrome unless there is already severe underlying pulm or vascular dz with poor reserve) plus two Bs  = Bronchiectasis and Beta blocker use..and one C= CHF  Rec: Change labetolol to bisoprolol Max rx for gerd Check sinus CT UP walking as much as possible and out of bed using IS  Check echo results pending      2) HBP In the setting of respiratory symptoms of unknown etiology,  It would be preferable to use bystolic, the most beta -1  selective Beta blocker available in sample form, with bisoprolol the most selective generic choice  on the market, at least on a trial basis, to make sure the spillover Beta 2 effects of the less specific Beta blockers are not contributing to this patient's symptoms.  >>> also unless there's some compelling reason to use procardia, this drug in high doses tends to worsen V/Q matching by interfering with pulmonary vasoconstriction in areas of low 02 so would try to  control bp with it - hydralazine or minoxidil would be ok.   Labs   CBC: Recent Labs  Lab 06/26/21 1203 06/27/21 0426  WBC 9.3 9.1  NEUTROABS 6.7  --   HGB 9.6* 10.1*  HCT 31.0* 34.7*  MCV 93.4 97.5  PLT 254 194    Basic Metabolic Panel: Recent Labs  Lab 06/26/21 1203 06/27/21 1056  NA 139 135  K 3.3* 4.1  CL 100 100  CO2 26 23  GLUCOSE 86 176*  BUN 25* 41*  CREATININE 6.16* 9.68*  CALCIUM 8.6* 8.9  PHOS  --  5.3*   GFR: Estimated Creatinine Clearance: 8.2 mL/min (A) (by C-G formula based on SCr of 9.68 mg/dL (H)). Recent Labs  Lab 06/26/21 1203 06/27/21 0426  WBC 9.3 9.1    Liver Function Tests: Recent Labs  Lab 06/26/21  1203 06/27/21 1056  AST 23  --   ALT 37  --   ALKPHOS 114  --   BILITOT 0.7  --   PROT 7.5  --   ALBUMIN 4.2 3.9   No results for input(s): LIPASE, AMYLASE in the last 168 hours. No results for input(s): AMMONIA in the last 168 hours.  ABG    Component Value Date/Time   PHART 7.513 (H) 02/24/2018 1810   PCO2ART 35.4 02/24/2018 1810   PO2ART 52.5 (L) 02/24/2018 1810   HCO3 19.3 (L) 01/11/2020 0011   TCO2 29 09/27/2016 1245   ACIDBASEDEF 5.7 (H) 01/11/2020 0011   O2SAT 73.0 01/11/2020 0011     Coagulation Profile: Recent Labs  Lab 06/26/21 1203  INR 1.2    Cardiac Enzymes: No results for input(s): CKTOTAL, CKMB, CKMBINDEX, TROPONINI in the last 168 hours.  HbA1C: Hgb A1c MFr Bld  Date/Time Value Ref Range Status  06/26/2021 11:59 AM 8.3 (H) 4.8 - 5.6 % Final    Comment:    (NOTE) Pre diabetes:          5.7%-6.4%  Diabetes:              >6.4%  Glycemic control for   <7.0% adults with diabetes   08/04/2020 04:52 AM 8.4 (H) 4.8 - 5.6 % Final    Comment:    (NOTE) Pre diabetes:          5.7%-6.4%  Diabetes:              >6.4%  Glycemic control for   <7.0% adults with diabetes     CBG: Recent Labs  Lab 06/26/21 1742 06/26/21 2107 06/27/21 0710 06/27/21 1104  GLUCAP 81 167* 117* 175*      Past  Medical History:  She,  has a past medical history of Anemia, Ankle fracture, Arthritis, Blood transfusion without reported diagnosis, Breast cancer ([place]), Cancer ([place]), Diabetes mellitus without complication ([place]), Dialysis patient ([place]), End stage renal disease on dialysis ([place]), GERD (gastroesophageal reflux disease), Hypertension, OSA (obstructive sleep apnea), Pneumonia, PONV (postoperative nausea and vomiting), and Wears glasses.   Surgical History:   Past Surgical History:  Procedure Laterality Date   ABDOMINAL HYSTERECTOMY     AV FISTULA PLACEMENT  11/2014   at [place] N/A 07/10/2016   Procedure: BALLOON DILATION;  Surgeon: Danie Binder, MD;  Location: [facility];  Service: Endoscopy;  Laterality: N/A;  Pyloric dilation   BREAST LUMPECTOMY     CESAREAN SECTION     CHOLECYSTECTOMY     COLONOSCOPY WITH PROPOFOL N/A 09/27/2016   Dr. Gala Romney: Internal hemorrhoids repeat colonoscopy in 10 years   DILATION AND CURETTAGE OF UTERUS     ESOPHAGOGASTRODUODENOSCOPY N/A 07/10/2016   Dr.Fields- normal esophagus, gastric stenosis was found at the pylorus, gastritis on bx, normal examined duodenun   EXTERNAL FIXATION REMOVAL Right 10/29/2018   Procedure: REMOVAL RIGHT ANKLE BIOMET ZIMMER EXTERNAL FIXATOR, SHORT LEG CAST APPLICATION;  Surgeon: Marybelle Killings, MD;  Location: [facility];  Service: Orthopedics;  Laterality: Right;   MASTECTOMY     left sided   ORIF ANKLE FRACTURE Right 10/06/2018   Procedure: OPEN REDUCTION INTERNAL FIXATION (ORIF) RIGHT ANKLE TRIMALLEOLAR;  Surgeon: Marybelle Killings, MD;  Location: [facility];  Service: Orthopedics;  Laterality: Right;     Social History:   reports that she has never smoked. She has never used smokeless tobacco. She reports that she does not drink alcohol and  does not use drugs.   Family History:  Her family history includes Diabetes Mellitus II in her mother; Hypertension in her mother, sister, and sister. There is no history of  Colon cancer.   Allergies Allergies  Allergen Reactions   Amlodipine Besylate Rash and Other (See Comments)    dizziness   Reglan [Metoclopramide] Other (See Comments)    hallucinations      Home Medications  Prior to Admission medications   Medication Sig Start Date End Date Taking? Authorizing Provider  acetaminophen (TYLENOL) 325 MG tablet Take 650 mg by mouth every 6 (six) hours as needed.   Yes [provider]  albuterol (PROVENTIL HFA;VENTOLIN HFA) 108 (90 Base) MCG/ACT inhaler Inhale 2 puffs into the lungs every 6 (six) hours as needed for wheezing or shortness of breath. 11/28/17  Yes Kathie Dike, MD  albuterol (PROVENTIL) (2.5 MG/3ML) 0.083% nebulizer solution Take 3 mLs (2.5 mg total) by nebulization every 6 (six) hours as needed for wheezing or shortness of breath. 11/28/17  Yes Kathie Dike, MD  budesonide-formoterol (SYMBICORT) 80-4.5 MCG/ACT inhaler Inhale 2 puffs into the lungs 2 (two) times daily.   Yes [provider]  cinacalcet (SENSIPAR) 60 MG tablet Take 30 mg by mouth daily with breakfast.   Yes [provider]  dicyclomine (BENTYL) 10 MG capsule Take 10 mg by mouth 2 (two) times daily as needed. 10/06/19  Yes [provider]  ferric citrate (AURYXIA) 1 GM 210 MG(Fe) tablet Take 2 tablets by mouth 2 (two) times daily with a meal. With Breakfast & with supper   Yes [provider]  FLOVENT HFA 110 MCG/ACT inhaler 1 puff 2 (two) times daily. 12/04/19  Yes [provider]  glipiZIDE (GLUCOTROL) 5 MG tablet Take 5 mg by mouth 2 (two) times daily as needed. For blood sugar levels over 150   Yes [provider]  ipratropium (ATROVENT) 0.03 % nasal spray Place 2 sprays into both nostrils 2 (two) times daily.  06/06/19  Yes [provider]  labetalol (NORMODYNE) 200 MG tablet Take 200 mg by mouth 2 (two) times daily. 06/23/21  Yes [provider]  linaclotide (LINZESS) 290 MCG CAPS capsule  Take 290 mcg by mouth daily before breakfast.   Yes [provider]  multivitamin (RENA-VIT) TABS tablet Take 1 tablet by mouth daily.   Yes [provider]  NIFEdipine (ADALAT CC) 60 MG 24 hr tablet Take 1 tablet (60 mg total) by mouth every evening. 01/02/20  Yes Hongalgi, Lenis Dickinson, MD  ondansetron (ZOFRAN-ODT) 4 MG disintegrating tablet Take 4 mg by mouth as needed for nausea or vomiting.    Yes [provider]  pantoprazole (PROTONIX) 40 MG tablet Take 1 tablet (40 mg total) by mouth daily. 30 minutes before breakfast 02/16/21  Yes Annitta Needs, NP  rOPINIRole (REQUIP XL) 2 MG 24 hr tablet Take 2 mg by mouth at bedtime.  09/10/16  Yes [provider]  sevelamer carbonate (RENVELA) 800 MG tablet Take 800 mg by mouth See admin instructions. Take 2 tablets (1600 mg) in the morning, take 3 tablets (2400 mg) with lunch or snack,  and take 2 tablets (1600 mg) by mouth in the evening with dinner meal.   Yes [provider]  bisacodyl (DULCOLAX) 5 MG EC tablet Take 5 mg by mouth daily as needed. Patient not taking: Reported on 06/26/2021    [provider]  docusate sodium (COLACE) 100 MG capsule Take 100 mg by mouth daily  as needed. Patient not taking: Reported on 06/26/2021    [provider]  furosemide (LASIX) 40 MG tablet Take 40 mg by mouth 3 (three) times daily. Patient not taking: No sig reported 11/10/20   [provider]  sodium chloride (OCEAN) 0.65 % SOLN nasal spray Place 1 spray into both nostrils as needed for congestion. Patient not taking: No sig reported 01/02/20   Modena Jansky, MD         Christinia Gully, MD [facility] [place] [phone]   After 7:00 pm call Elink  [phone]

## 2021-06-27 NOTE — Procedures (Signed)
   HEMODIALYSIS TREATMENT NOTE:  Extra HD session completed, uneventful x 3 hours.  Goal met: 2.3 liters removed without interruption in UF --UFP#4 and Na modeling 148-->138 per pt's request to prevent cramping.  All blood was returned and hemostasis was achieved in 15 minutes.  No changes from pre-HD assessment.  Rockwell Alexandria, RN

## 2021-06-27 NOTE — Care Management Obs Status (Signed)
[facility] NOTIFICATION   Patient Details  Name: Patricia [name] MRN: [id number] Date of Birth: March 20, 1971   Medicare Observation Status Notification Given:  Yes    Tommy Medal 06/27/2021, 4:07 PM

## 2021-06-27 NOTE — Progress Notes (Signed)
PROGRESS NOTE   Patricia [name]  WUX:[id number] DOB: 05-Apr-1971 DOA: 06/26/2021 PCP: Jake Samples, PA-C   Chief Complaint  Patient presents with   Shortness of Breath   Level of care: Telemetry  Brief Admission History:  50 y.o. female with a PMH significant for DM, HTN, chronic diastolic CHF, morbid obesity, asthma, ESRD, and diagnosis of covid-19 infection since 12/13/19 who presented to [facility] ED after HD yesterday with worsening SOB and DOE.  She reports that this has been going on since May but was worse over the weekend and has to use her "emergency inhaler" daily.  She completed her full treatment of HD yesterday and reached her target weight but continued to have SOB and DOE.  In the ED she was hypoxic with SpO2 of 83% on room air, CXR with pulmonary vascular congestion, BNP 73, repiratory panel was negative, and CT angio negative for PE.  We were consulted to provide HD during her hospitalization as well as to have extra HD session to see if more volume removal will improve her oxygenation as well as her SOB/DOE.    Assessment & Plan:   Principal Problem:   Acute respiratory failure with hypoxia (HCC) Active Problems:   End stage renal disease on dialysis ([facility])   Type 2 diabetes mellitus (HCC)   Essential hypertension   OSA (obstructive sleep apnea)   Post Covid Pulmonary Fibrosis - Unfortunately patient has never recovered her lung capacity since her bout with Covid last year.  She has scarring in the lungs with fibrotic changes.  She is oxygen dependent.  I have asked for an inpatient pulmonary consultation with Dr. Melvyn Novas for recommendations.    ESRD - Pt is on HD MWF and her volume status has remained stable and she monitors her fluid intake.   Essential hypertension - BPs well managed.    Anemia of CKD - Pt is on ESA per nephrology team.     HFpEF - I will get a new echo today as part of dyspnea work up.  Last Echo was in 2021.    OSA - stable on nightly CPAP therapy.      Type 2 DM with renal complications - Uncontrolled as evidenced by an A1c of 8.3%.  For now, SSI coverage until we can monitor the CBG trend and adjust therapy later as needed for goal BS 140-180.   Chronic constipation - Pt has been resumed on home linzess therapy.    GERD - pantoprazole 40 mg daily for GI protection.     DVT prophylaxis: SQ heparin  Code Status: Full  Family Communication: plan of care discussed with patient at bedside who verbalized understanding Disposition: home  Status is: Observation  The patient remains OBS appropriate and will d/c before 2 midnights.  Dispo: The patient is from: Home              Anticipated d/c is to: Home              Patient currently is not medically stable to d/c.   Difficult to place patient No  Consultants:  Nephrology pulmonology  Procedures:  CTA  2D Echo   Antimicrobials:  N/a    Subjective: Pt reports she is very SOB with minimal ambulation and has never fully recovered from covid infection last year.    Objective: Vitals:   06/26/21 2211 06/27/21 0545 06/27/21 0836 06/27/21 0838  BP:  (!) 162/85    Pulse:  71    Resp:  Temp:  98.2 F (36.8 C)    TempSrc:  Oral    SpO2: 92% 95% 91% 96%  Weight:      Height:        Intake/Output Summary (Last 24 hours) at 06/27/2021 1252 Last data filed at 06/27/2021 0900 Gross per 24 hour  Intake 360 ml  Output --  Net 360 ml   Filed Weights   06/26/21 1039  Weight: 111.1 kg    Examination:  General exam: obese female, awake, sitting up, shallow breathing, Appears calm and comfortable  Respiratory system: mild tachypnea, rare exp wheezing heard, rales heard.  Cardiovascular system: normal S1 & S2 heard. No JVD, murmurs, rubs, gallops or clicks. No pedal edema. Gastrointestinal system: Abdomen is obese, nondistended, soft and nontender. No organomegaly or masses felt. Normal bowel sounds heard. Central nervous system: Alert and oriented. No focal neurological  deficits. Extremities: Symmetric 5 x 5 power. Skin: No rashes, lesions or ulcers Psychiatry: Judgement and insight appear normal. Mood & affect appropriate.   Data Reviewed: I have personally reviewed following labs and imaging studies  CBC: Recent Labs  Lab 06/26/21 1203 06/27/21 0426  WBC 9.3 9.1  NEUTROABS 6.7  --   HGB 9.6* 10.1*  HCT 31.0* 34.7*  MCV 93.4 97.5  PLT 254 202    Basic Metabolic Panel: Recent Labs  Lab 06/26/21 1203 06/27/21 1056  NA 139 135  K 3.3* 4.1  CL 100 100  CO2 26 23  GLUCOSE 86 176*  BUN 25* 41*  CREATININE 6.16* 9.68*  CALCIUM 8.6* 8.9  PHOS  --  5.3*    GFR: Estimated Creatinine Clearance: 8.2 mL/min (A) (by C-G formula based on SCr of 9.68 mg/dL (H)).  Liver Function Tests: Recent Labs  Lab 06/26/21 1203 06/27/21 1056  AST 23  --   ALT 37  --   ALKPHOS 114  --   BILITOT 0.7  --   PROT 7.5  --   ALBUMIN 4.2 3.9    CBG: Recent Labs  Lab 06/26/21 1742 06/26/21 2107 06/27/21 0710 06/27/21 1104  GLUCAP 81 167* 117* 175*    Recent Results (from the past 240 hour(s))  Resp Panel by RT-PCR (Flu A&B, Covid) Nasopharyngeal Swab     Status: None   Collection Time: 06/26/21 11:26 AM   Specimen: Nasopharyngeal Swab; Nasopharyngeal(NP) swabs in vial transport medium  Result Value Ref Range Status   SARS Coronavirus 2 by RT PCR NEGATIVE NEGATIVE Final    Comment: (NOTE) SARS-CoV-2 target nucleic acids are NOT DETECTED.  The SARS-CoV-2 RNA is generally detectable in upper respiratory specimens during the acute phase of infection. The lowest concentration of SARS-CoV-2 viral copies this assay can detect is 138 copies/mL. A negative result does not preclude SARS-Cov-2 infection and should not be used as the sole basis for treatment or other patient management decisions. A negative result may occur with  improper specimen collection/handling, submission of specimen other than nasopharyngeal swab, presence of viral mutation(s)  within the areas targeted by this assay, and inadequate number of viral copies(<138 copies/mL). A negative result must be combined with clinical observations, patient history, and epidemiological information. The expected result is Negative.  Fact Sheet for Patients:  EntrepreneurPulse.com.au  Fact Sheet for Healthcare Providers:  IncredibleEmployment.be  This test is no t yet approved or cleared by the [place] FDA and  has been authorized for detection and/or diagnosis of SARS-CoV-2 by FDA under an Emergency Use Authorization (EUA). This EUA will remain  in effect (meaning this test can be used) for the duration of the COVID-19 declaration under Section 564(b)(1) of the Act, 21 U.S.C.section 360bbb-3(b)(1), unless the authorization is terminated  or revoked sooner.       Influenza A by PCR NEGATIVE NEGATIVE Final   Influenza B by PCR NEGATIVE NEGATIVE Final    Comment: (NOTE) The Xpert Xpress SARS-CoV-2/FLU/RSV plus assay is intended as an aid in the diagnosis of influenza from Nasopharyngeal swab specimens and should not be used as a sole basis for treatment. Nasal washings and aspirates are unacceptable for Xpert Xpress SARS-CoV-2/FLU/RSV testing.  Fact Sheet for Patients: EntrepreneurPulse.com.au  Fact Sheet for Healthcare Providers: IncredibleEmployment.be  This test is not yet approved or cleared by the [place] FDA and has been authorized for detection and/or diagnosis of SARS-CoV-2 by FDA under an Emergency Use Authorization (EUA). This EUA will remain in effect (meaning this test can be used) for the duration of the COVID-19 declaration under Section 564(b)(1) of the Act, 21 U.S.C. section 360bbb-3(b)(1), unless the authorization is terminated or revoked.  Performed at [facility], [address], [place], [place] [address]      Radiology Studies: CT Angio Chest Pulmonary  Embolism (PE) W or WO Contrast  Result Date: 06/26/2021 CLINICAL DATA:  Dyspnea, chest pain, hypoxia EXAM: CT ANGIOGRAPHY CHEST WITH CONTRAST TECHNIQUE: Multidetector CT imaging of the chest was performed using the standard protocol during bolus administration of intravenous contrast. Multiplanar CT image reconstructions and MIPs were obtained to evaluate the vascular anatomy. CONTRAST:  139mL OMNIPAQUE IOHEXOL 350 MG/ML SOLN COMPARISON:  12/22/2018 FINDINGS: Cardiovascular: Satisfactory opacification of the pulmonary arteries to the segmental level. No evidence of pulmonary embolism. Cardiomegaly. Left coronary artery calcifications. No pericardial effusion. Aortic atherosclerosis. Mediastinum/Nodes: Numerous prominent mediastinal lymph nodes, unchanged. Thyroid gland, trachea, and esophagus demonstrate no significant findings. Lungs/Pleura: Mosaic attenuation of the airspaces with bandlike scarring and or partial atelectasis of the left greater than right dependent lung bases. No pleural effusion or pneumothorax. Upper Abdomen: No acute abnormality. Benign adenomatous thickening of the bilateral adrenal glands. Musculoskeletal: No chest wall abnormality. No acute or significant osseous findings. Review of the MIP images confirms the above findings. IMPRESSION: 1. Negative examination for pulmonary embolism. 2. 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 3. Cardiomegaly and coronary artery disease. Aortic Atherosclerosis (ICD10-I70.0). Electronically Signed   By: Eddie Candle M.D.   On: 06/26/2021 16:50   DG Chest Portable 1 View  Result Date: 06/26/2021 CLINICAL DATA:  Shortness of breath EXAM: PORTABLE CHEST 1 VIEW COMPARISON:  08/03/2020 FINDINGS: Mild pulmonary vascular congestion. No pleural effusion. No  pneumothorax. Cardiomegaly. IMPRESSION: Pulmonary vascular congestion.  Cardiomegaly. Electronically Signed   By: Macy Mis M.D.   On: 06/26/2021 12:10    Scheduled Meds:  Chlorhexidine Gluconate Cloth  6 each Topical Q0600   heparin  5,000 Units Subcutaneous Q8H   insulin aspart  0-5 Units Subcutaneous QHS   insulin aspart  0-6 Units Subcutaneous TID WC   ipratropium-albuterol  3 mL Nebulization TID   labetalol  200 mg Oral BID   linaclotide  290 mcg Oral QAC breakfast   mometasone-formoterol  2 puff Inhalation BID   multivitamin  1 tablet Oral Daily   NIFEdipine  60 mg Oral QPM   pantoprazole  40 mg Oral Daily   rOPINIRole  1  mg Oral BID   sevelamer carbonate  1,600 mg Oral BID WC   sevelamer carbonate  2,400 mg Oral Q lunch   Continuous Infusions:   LOS: 0 days   Time spent: 36 mins   Roald Lukacs Wynetta Emery, MD How to contact the [facility] Attending or Consulting provider [place] or covering provider during after hours [place], for this patient?  Check the care team in [facility] and look for a) attending/consulting TRH provider listed and b) the [facility] team listed Log into [email or web address] and use [place]'s universal password to access. If you do not have the password, please contact the hospital operator. Locate the [facility] provider you are looking for under [facility] and page to a number that you can be directly reached. If you still have difficulty reaching the provider, please page the [facility] (Director on Call) for the Hospitalists listed on amion for assistance.  06/27/2021, 12:52 PM

## 2021-06-27 NOTE — Progress Notes (Signed)
Pt called to be taken off CPAP and placed back on 2.5lpm cann

## 2021-06-28 DIAGNOSIS — R0602 Shortness of breath: Secondary | ICD-10-CM | POA: Diagnosis not present

## 2021-06-28 DIAGNOSIS — I5032 Chronic diastolic (congestive) heart failure: Secondary | ICD-10-CM | POA: Diagnosis not present

## 2021-06-28 DIAGNOSIS — N186 End stage renal disease: Secondary | ICD-10-CM | POA: Diagnosis not present

## 2021-06-28 DIAGNOSIS — I132 Hypertensive heart and chronic kidney disease with heart failure and with stage 5 chronic kidney disease, or end stage renal disease: Secondary | ICD-10-CM | POA: Diagnosis not present

## 2021-06-28 DIAGNOSIS — D631 Anemia in chronic kidney disease: Secondary | ICD-10-CM | POA: Diagnosis not present

## 2021-06-28 DIAGNOSIS — J9601 Acute respiratory failure with hypoxia: Secondary | ICD-10-CM | POA: Diagnosis not present

## 2021-06-28 DIAGNOSIS — Z992 Dependence on renal dialysis: Secondary | ICD-10-CM | POA: Diagnosis not present

## 2021-06-28 DIAGNOSIS — N250 Renal osteodystrophy: Secondary | ICD-10-CM | POA: Diagnosis not present

## 2021-06-28 LAB — RENAL FUNCTION PANEL
Albumin: 4.2 g/dL (ref 3.5–5.0)
Anion gap: 14 (ref 5–15)
BUN: 28 mg/dL — ABNORMAL HIGH (ref 6–20)
CO2: 27 mmol/L (ref 22–32)
Calcium: 9.5 mg/dL (ref 8.9–10.3)
Chloride: 94 mmol/L — ABNORMAL LOW (ref 98–111)
Creatinine, Ser: 7.65 mg/dL — ABNORMAL HIGH (ref 0.44–1.00)
GFR, Estimated: 6 mL/min — ABNORMAL LOW (ref >60)
Glucose, Bld: 310 mg/dL — ABNORMAL HIGH (ref 70–99)
Phosphorus: 4.8 mg/dL — ABNORMAL HIGH (ref 2.5–4.6)
Potassium: 4.2 mmol/L (ref 3.5–5.1)
Sodium: 135 mmol/L (ref 135–145)

## 2021-06-28 LAB — CBC
HCT: 35.3 % — ABNORMAL LOW (ref 36.0–46.0)
Hemoglobin: 10.6 g/dL — ABNORMAL LOW (ref 12.0–15.0)
MCH: 28.6 pg (ref 26.0–34.0)
MCHC: 30 g/dL (ref 30.0–36.0)
MCV: 95.4 fL (ref 80.0–100.0)
Platelets: 275 10*3/uL (ref 150–400)
RBC: 3.7 MIL/uL — ABNORMAL LOW (ref 3.87–5.11)
RDW: 14.6 % (ref 11.5–15.5)
WBC: 9.6 10*3/uL (ref 4.0–10.5)
nRBC: 0 % (ref 0.0–0.2)

## 2021-06-28 LAB — GLUCOSE, CAPILLARY
Glucose-Capillary: 273 mg/dL — ABNORMAL HIGH (ref 70–99)
Glucose-Capillary: 198 mg/dL — ABNORMAL HIGH (ref 70–99)

## 2021-06-28 LAB — MAGNESIUM: Magnesium: 1.9 mg/dL (ref 1.7–2.4)

## 2021-06-28 MED ORDER — BISOPROLOL FUMARATE 5 MG PO TABS: 5.0000 mg | ORAL_TABLET | Freq: Two times a day (BID) | ORAL | 2 refills | Status: DC

## 2021-06-28 MED ORDER — HEPARIN SODIUM (PORCINE) 1000 UNIT/ML DIALYSIS: 20.0000 [IU]/kg | INTRAMUSCULAR | Status: DC | PRN

## 2021-06-28 MED ORDER — HEPARIN SODIUM (PORCINE) 1000 UNIT/ML IJ SOLN
INTRAMUSCULAR | Status: AC
  Filled 2021-06-28: qty 2

## 2021-06-28 NOTE — Procedures (Signed)
   HEMODIALYSIS TREATMENT NOTE:  Uneventful 3.5 hour HD completed via left upper arm AVF (15g/antegrade).  2-3L goal NOT met.  UF was interrupted during the last hour due to severe abdominal cramps relieved with NS bolus.  Net UF 1.7 L. All blood was returned and hemostasis was achieved in 15 minutes.  Post weight (standing scale) 107.8 kg.  Current EDW 111 kg.  Unable to reach outpatient HD clinical staff to report new post-weight; no answer.  Unable to leave voicemail; mailbox was full.  Pt states she will relay new weight to Ramiro Harvest, PA-C.  Rockwell Alexandria, RN

## 2021-06-28 NOTE — TOC Transition Note (Signed)
Transition of Care [facility]) - CM/SW Discharge Note   Patient Details  Name: Patricia [name] MRN: [id number] Date of Birth: [birth year]  Transition of Care [facility] CM/SW Contact:  Boneta Lucks, RN Phone Number: 06/28/2021, 3:15 PM   Clinical Narrative:   Patient admitted in OBS for acute respiratory failure with hypoxia. After dialysis patient will discharge home today. She is in need of home oxygen. [facility] will [facility] accepted the referral. Oxygen delivered to the room. RN and patient given [facility]'s number to call before leaving the hospital.     Final next level of care: Home/Self Care Barriers to Discharge: Barriers Resolved   Patient Goals and CMS Choice Patient states their goals for this hospitalization and ongoing recovery are:: to go home. CMS Medicare.gov Compare Post Acute Care list provided to:: Patient    Discharge Placement                    Patient and family notified of of transfer: 06/28/21  Discharge Plan and Services                DME Arranged: Oxygen   Date DME Agency Contacted: 06/28/21 Time DME Agency Contacted: 1200 Representative spoke with at DME Agency: [place] ([place]) Interventions     Readmission Risk Interventions Readmission Risk Prevention Plan 01/12/2020  Transportation Screening Complete  PCP or Specialist Appt within 3-5 Days Not Complete  HRI or [place] Complete  Social Work Consult for [place] Planning/Counseling Complete  Palliative Care Screening Not Complete  Medication Review Press photographer) Complete  Some recent data might be hidden

## 2021-06-28 NOTE — Discharge Summary (Signed)
Physician Discharge Summary  Foster Sonnier IWL:[id number] DOB: [birth year] DOA: 06/26/2021  PCP: Jake Samples, PA-C  Admit date: 06/26/2021  Discharge date: 06/28/2021  Admitted From:Home  Disposition:  Home  Recommendations for Outpatient Follow-up:  Follow up with PCP in 1-2 weeks Follow-up with pulmonology will be scheduled in the next 1-2 weeks Continue on bisoprolol per pulmonology recommendations and avoid further use of labetalol Continue on Protonix as previously noted for treatment of GERD Continue on oxygen 2 L via nasal cannula continuous as prescribed Continue hemodialysis per usual routine schedule MWF with next hemodialysis on 7/15  Home Health: None  Equipment/Devices: Nasal cannula 2 L oxygen  Discharge Condition:Stable  CODE STATUS: Full  Diet recommendation: Heart Healthy/carb modified  Brief/Interim Summary:  50 y.o. female with a PMH significant for DM, HTN, chronic diastolic CHF, morbid obesity, asthma, ESRD, and diagnosis of covid-19 infection since 12/13/19 who presented to [facility] ED after HD yesterday with worsening SOB and DOE.  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  She apparently has never recovered her lung capacity since her bout with COVID last year.  She was seen by pulmonology with recommendations to change some of her medications to assist with bronchospasms as noted above.  CT scan of her sinuses did not reveal any significant findings.  She continues to have an oxygen requirement even at rest but no significant respiratory distress or wheezing otherwise noted.  She is in stable condition for discharge and will require home oxygen which will be arranged.  She will have further hemodialysis on 7/13 per her usual schedule prior to discharge.  No other acute events have been noted throughout the course of the stay.  She is stable for discharge.  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  Discharge Instructions  Discharge Instructions     Ambulatory referral to Pulmonology   Complete by: As directed    Reason for referral: Asthma/COPD   Diet - low sodium heart healthy   Complete by: As directed    Increase activity slowly   Complete by: As directed       Allergies as of 06/28/2021       Reactions   Amlodipine Besylate Rash, Other (See Comments)   dizziness   Reglan [metoclopramide] Other (See Comments)   hallucinations        Medication List     STOP taking these medications    bisacodyl 5 MG EC tablet Commonly known as: DULCOLAX   docusate sodium 100 MG capsule Commonly known as: COLACE   furosemide 40 MG tablet Commonly known as: LASIX   labetalol 200 MG tablet Commonly known as: NORMODYNE   NIFEdipine 60 MG 24 hr tablet Commonly known as: ADALAT CC       TAKE these medications    acetaminophen 325 MG tablet Commonly known as: TYLENOL Take 650 mg by mouth every 6 (six) hours as needed.   albuterol (2.5 MG/3ML) 0.083% nebulizer solution Commonly known as: PROVENTIL Take 3 mLs (2.5 mg total) by nebulization every 6 (six) hours as needed for wheezing or shortness of breath.   albuterol 108 (90 Base) MCG/ACT inhaler Commonly known  as: VENTOLIN HFA Inhale 2 puffs into the lungs every 6 (six) hours as needed for wheezing or shortness of breath.   bisoprolol 5 MG tablet Commonly known as: ZEBETA Take 1 tablet (5 mg total) by mouth 2 (two) times daily.   budesonide-formoterol 80-4.5 MCG/ACT inhaler Commonly known as: SYMBICORT Inhale 2 puffs into the lungs 2 (two) times daily.   cinacalcet 60  MG tablet Commonly known as: SENSIPAR Take 30 mg by mouth daily with breakfast.   dicyclomine 10 MG capsule Commonly known as: BENTYL Take 10 mg by mouth 2 (two) times daily as needed.   ferric citrate 1 GM 210 MG(Fe) tablet Commonly known as: AURYXIA Take 2 tablets by mouth 2 (two) times daily with a meal. With Breakfast & with supper   Flovent HFA 110 MCG/ACT inhaler Generic drug: fluticasone 1 puff 2 (two) times daily.   glipiZIDE 5 MG tablet Commonly known as: GLUCOTROL Take 5 mg by mouth 2 (two) times daily as needed. For blood sugar levels over 150   ipratropium 0.03 % nasal spray Commonly known as: ATROVENT Place 2 sprays into both nostrils 2 (two) times daily.   linaclotide 290 MCG Caps capsule Commonly known as: LINZESS Take 290 mcg by mouth daily before breakfast.   multivitamin Tabs tablet Take 1 tablet by mouth daily.   ondansetron 4 MG disintegrating tablet Commonly known as: ZOFRAN-ODT Take 4 mg by mouth as needed for nausea or vomiting.   pantoprazole 40 MG tablet Commonly known as: PROTONIX Take 1 tablet (40 mg total) by mouth daily. 30 minutes before breakfast   rOPINIRole 2 MG 24 hr tablet Commonly known as: REQUIP XL Take 2 mg by mouth at bedtime.   sevelamer carbonate 800 MG tablet Commonly known as: RENVELA Take 800 mg by mouth See admin instructions. Take 2 tablets (1600 mg) in the morning, take 3 tablets (2400 mg) with lunch or snack,  and take 2 tablets (1600 mg) by mouth in the evening with dinner meal.   sodium chloride 0.65 % Soln nasal spray Commonly known as: OCEAN Place 1 spray into both nostrils as needed for congestion.               Durable Medical Equipment  (From admission, onward)           Start     Ordered   06/28/21 1130  DME Oxygen  Once       Question Answer Comment  Length of Need Lifetime   Mode or (Route) Nasal cannula   Liters per Minute 2   Frequency Continuous (stationary and portable oxygen unit  needed)   Oxygen conserving device Yes   Oxygen delivery system Gas      06/28/21 1130            Follow-up Information     Jake Samples, PA-C. Schedule an appointment as soon as possible for a visit in 1 week(s).   Specialty: Family Medicine Contact information: [address] [place] [address] [phone]         [facility]. Schedule an appointment as soon as possible for a visit in 1 week(s).   Specialty: Pulmonology Contact information: [address] [place] [address] [phone]               Allergies  Allergen Reactions   Amlodipine Besylate Rash and Other (See Comments)    dizziness   Reglan [Metoclopramide] Other (See Comments)    hallucinations  Consultations: Nephrology Pulmonology   Procedures/Studies: CT Angio Chest Pulmonary Embolism (PE) W or WO Contrast  Result Date: 06/26/2021 CLINICAL DATA:  Dyspnea, chest pain, hypoxia EXAM: CT ANGIOGRAPHY CHEST WITH CONTRAST TECHNIQUE: Multidetector CT imaging of the chest was performed using the standard protocol during bolus administration of intravenous contrast. Multiplanar CT image reconstructions and MIPs were obtained to evaluate the vascular anatomy. CONTRAST:  128mL OMNIPAQUE IOHEXOL 350 MG/ML SOLN COMPARISON:  12/22/2018 FINDINGS: Cardiovascular: Satisfactory opacification of the pulmonary arteries to the segmental level. No evidence of pulmonary embolism. Cardiomegaly. Left coronary artery calcifications. No pericardial effusion. Aortic atherosclerosis. Mediastinum/Nodes: Numerous prominent mediastinal lymph nodes, unchanged. Thyroid gland, trachea, and esophagus demonstrate no significant findings. Lungs/Pleura: Mosaic attenuation of the airspaces with bandlike scarring and or partial atelectasis of the left greater than right dependent lung bases. No pleural effusion or pneumothorax. Upper Abdomen: No acute abnormality. Benign  adenomatous thickening of the bilateral adrenal glands. Musculoskeletal: No chest wall abnormality. No acute or significant osseous findings. Review of the MIP images confirms the above findings. IMPRESSION: 1. Negative examination for pulmonary embolism. 2. 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 3. Cardiomegaly and coronary artery disease. Aortic Atherosclerosis (ICD10-I70.0). Electronically Signed   By: Eddie Candle M.D.   On: 06/26/2021 16:50   DG Chest Portable 1 View  Result Date: 06/26/2021 CLINICAL DATA:  Shortness of breath EXAM: PORTABLE CHEST 1 VIEW COMPARISON:  08/03/2020 FINDINGS: Mild pulmonary vascular congestion. No pleural effusion. No pneumothorax. Cardiomegaly. IMPRESSION: Pulmonary vascular congestion.  Cardiomegaly. Electronically Signed   By: Macy Mis M.D.   On: 06/26/2021 12:10   ECHOCARDIOGRAM COMPLETE  Result Date: 06/27/2021    ECHOCARDIOGRAM REPORT   Patient Name:   [facility] [place] Date of Exam: 06/27/2021 Medical Rec #:  [id number]   Height:       63.0 in Accession #:    [id number]  Weight:       245.0 lb Date of Birth:  06-04-1971  BSA:          2.108 m Patient Age:    20 years    BP:           162/85 mmHg Patient Gender: F           HR:           72 bpm. Exam Location:  [facility] Procedure: 2D Echo, Cardiac Doppler and Color Doppler Indications:    Dyspnea  History:        Patient has prior history of Echocardiogram examinations, most                 recent 08/04/2020. CHF, Signs/Symptoms:Syncope, Shortness of                 Breath and Chest Pain; Risk Factors:Diabetes. Breast CA.  Sonographer:    Wenda Low Referring Phys: [place]  1. Left ventricular ejection fraction, by estimation, is 55 to 60%. The left ventricle has normal function. The left  ventricle has no regional wall motion abnormalities. There is mild left ventricular hypertrophy. Left ventricular diastolic parameters are consistent with Grade II diastolic dysfunction (pseudonormalization).  2. Right ventricular systolic function is low normal. The right ventricular size is normal. There is normal pulmonary artery systolic pressure. The estimated right ventricular systolic pressure is 40.3 mmHg.  3. Left atrial  size was mildly dilated.  4. There is a trivial pericardial effusion posterior to the left ventricle.  5. The mitral valve is grossly normal. Mild mitral valve regurgitation.  6. The aortic valve is tricuspid. Aortic valve regurgitation is not visualized. Mild to moderate aortic valve sclerosis/calcification is present, without any evidence of aortic stenosis. Aortic valve mean gradient measures 7.0 mmHg.  7. The inferior vena cava is normal in size with greater than 50% respiratory variability, suggesting right atrial pressure of 3 mmHg. FINDINGS  Left Ventricle: Left ventricular ejection fraction, by estimation, is 55 to 60%. The left ventricle has normal function. The left ventricle has no regional wall motion abnormalities. The left ventricular internal cavity size was normal in size. There is  mild left ventricular hypertrophy. Left ventricular diastolic parameters are consistent with Grade II diastolic dysfunction (pseudonormalization). Right Ventricle: The right ventricular size is normal. No increase in right ventricular wall thickness. Right ventricular systolic function is low normal. There is normal pulmonary artery systolic pressure. The tricuspid regurgitant velocity is 2.58 m/s,  and with an assumed right atrial pressure of 3 mmHg, the estimated right ventricular systolic pressure is 70.9 mmHg. Left Atrium: Left atrial size was mildly dilated. Right Atrium: Right atrial size was normal in size. Pericardium: Trivial pericardial effusion is present. The pericardial effusion is  posterior to the left ventricle. Mitral Valve: The mitral valve is grossly normal. Mild mitral annular calcification. Mild mitral valve regurgitation. MV peak gradient, 6.1 mmHg. The mean mitral valve gradient is 3.0 mmHg. Tricuspid Valve: The tricuspid valve is grossly normal. Tricuspid valve regurgitation is mild. Aortic Valve: The aortic valve is tricuspid. There is mild to moderate aortic valve annular calcification. Aortic valve regurgitation is not visualized. Mild to moderate aortic valve sclerosis/calcification is present, without any evidence of aortic stenosis. Aortic valve mean gradient measures 7.0 mmHg. Aortic valve peak gradient measures 13.5 mmHg. Aortic valve area, by VTI measures 2.04 cm. Pulmonic Valve: The pulmonic valve was grossly normal. Pulmonic valve regurgitation is trivial. Aorta: The aortic root is normal in size and structure. Venous: The inferior vena cava is normal in size with greater than 50% respiratory variability, suggesting right atrial pressure of 3 mmHg. IAS/Shunts: No atrial level shunt detected by color flow Doppler.  LEFT VENTRICLE PLAX 2D LVIDd:         4.63 cm  Diastology LVIDs:         3.32 cm  LV e' medial:    4.73 cm/s LV PW:         1.34 cm  LV E/e' medial:  22.0 LV IVS:        1.11 cm  LV e' lateral:   6.75 cm/s LVOT diam:     1.90 cm  LV E/e' lateral: 15.4 LV SV:         74 LV SV Index:   35 LVOT Area:     2.84 cm  RIGHT VENTRICLE RV Basal diam:  4.04 cm RV Mid diam:    4.38 cm RV S prime:     14.70 cm/s TAPSE (M-mode): 3.2 cm LEFT ATRIUM             Index       RIGHT ATRIUM           Index LA diam:        4.40 cm 2.09 cm/m  RA Area:     18.70 cm LA Vol (A2C):   73.8 ml 35.01 ml/m RA Volume:  49.60 ml  23.53 ml/m LA Vol (A4C):   65.2 ml 30.93 ml/m LA Biplane Vol: 73.4 ml 34.82 ml/m  AORTIC VALVE AV Area (Vmax):    2.05 cm AV Area (Vmean):   1.85 cm AV Area (VTI):     2.04 cm AV Vmax:           184.00 cm/s AV Vmean:          120.000 cm/s AV VTI:             0.361 m AV Peak Grad:      13.5 mmHg AV Mean Grad:      7.0 mmHg LVOT Vmax:         133.00 cm/s LVOT Vmean:        78.100 cm/s LVOT VTI:          0.260 m LVOT/AV VTI ratio: 0.72  AORTA Ao Root diam: 2.50 cm MITRAL VALVE                TRICUSPID VALVE MV Area (PHT): 2.74 cm     TR Peak grad:   26.6 mmHg MV Area VTI:   1.68 cm     TR Vmax:        258.00 cm/s MV Peak grad:  6.1 mmHg MV Mean grad:  3.0 mmHg     SHUNTS MV Vmax:       1.23 m/s     Systemic VTI:  0.26 m MV Vmean:      76.9 cm/s    Systemic Diam: 1.90 cm MV Decel Time: 277 msec MV E velocity: 104.00 cm/s MV A velocity: 99.80 cm/s MV E/A ratio:  1.04 Rozann Lesches MD Electronically signed by Rozann Lesches MD Signature Date/Time: 06/27/2021/4:16:53 PM    Final    CT MAXILLOFACIAL WO CONTRAST  Result Date: 06/27/2021 CLINICAL DATA:  Cough and chronic sinus disease. EXAM: CT MAXILLOFACIAL WITHOUT CONTRAST TECHNIQUE: Multidetector CT imaging of the maxillofacial structures was performed. Multiplanar CT image reconstructions were also generated. COMPARISON:  Head CT 08/03/2020 FINDINGS: Osseous: No facial fracture. Dental: Unremarkable appearance of the teeth, mandible and hard palate. Orbits: The globes are intact. Normal appearance of the intra- and extraconal fat. Symmetric extraocular muscles. Sinuses: No fluid levels or advanced mucosal thickening. Bilateral mastoid fluid, left-greater-than-right. Opacification of the left middle ear. Fluid was present bilaterally and in the left middle ear on 08/03/2020. Soft tissues: Enlarged and heterogeneous thyroid gland with dominant nodule measuring 3.0 cm. Limited intracranial: Normal. IMPRESSION: 1. No acute finding. 2. Bilateral mastoid fluid, left-greater-than-right, with chronic opacification of the left middle ear. 3. Enlarged and heterogeneous thyroid gland with dominant nodule measuring 3.0 cm. Recommend thyroid US (ref: J Am Coll Radiol. 2015 Feb;12(2): 143-50). Electronically Signed   By: Ulyses Jarred M.D.   On: 06/27/2021 19:40     Discharge Exam: Vitals:   06/28/21 0845 06/28/21 0850  BP:    Pulse:    Resp:    Temp:    SpO2: 93% 97%   Vitals:   06/27/21 1957 06/27/21 2031 06/28/21 0845 06/28/21 0850  BP:  (!) 141/64    Pulse:  75    Resp:      Temp:  97.9 F (36.6 C)    TempSrc:  Oral    SpO2: (!) 88% 93% 93% 97%  Weight:      Height:        General: Pt is alert, awake, not in acute distress, obese Cardiovascular: RRR, S1/S2 +, no  rubs, no gallops Respiratory: CTA bilaterally, no wheezing, no rhonchi, on 2 L nasal cannula oxygen Abdominal: Soft, NT, ND, bowel sounds + Extremities: no edema, no cyanosis    The results of significant diagnostics from this hospitalization (including imaging, microbiology, ancillary and laboratory) are listed below for reference.     Microbiology: Recent Results (from the past 240 hour(s))  Resp Panel by RT-PCR (Flu A&B, Covid) Nasopharyngeal Swab     Status: None   Collection Time: 06/26/21 11:26 AM   Specimen: Nasopharyngeal Swab; Nasopharyngeal(NP) swabs in vial transport medium  Result Value Ref Range Status   SARS Coronavirus 2 by RT PCR NEGATIVE NEGATIVE Final    Comment: (NOTE) SARS-CoV-2 target nucleic acids are NOT DETECTED.  The SARS-CoV-2 RNA is generally detectable in upper respiratory specimens during the acute phase of infection. The lowest concentration of SARS-CoV-2 viral copies this assay can detect is 138 copies/mL. A negative result does not preclude SARS-Cov-2 infection and should not be used as the sole basis for treatment or other patient management decisions. A negative result may occur with  improper specimen collection/handling, submission of specimen other than nasopharyngeal swab, presence of viral mutation(s) within the areas targeted by this assay, and inadequate number of viral copies(<138 copies/mL). A negative result must be combined with clinical observations, patient history, and  epidemiological information. The expected result is Negative.  Fact Sheet for Patients:  EntrepreneurPulse.com.au  Fact Sheet for Healthcare Providers:  IncredibleEmployment.be  This test is no t yet approved or cleared by the [place] FDA and  has been authorized for detection and/or diagnosis of SARS-CoV-2 by FDA under an Emergency Use Authorization (EUA). This EUA will remain  in effect (meaning this test can be used) for the duration of the COVID-19 declaration under Section 564(b)(1) of the Act, 21 U.S.C.section 360bbb-3(b)(1), unless the authorization is terminated  or revoked sooner.       Influenza A by PCR NEGATIVE NEGATIVE Final   Influenza B by PCR NEGATIVE NEGATIVE Final    Comment: (NOTE) The Xpert Xpress SARS-CoV-2/FLU/RSV plus assay is intended as an aid in the diagnosis of influenza from Nasopharyngeal swab specimens and should not be used as a sole basis for treatment. Nasal washings and aspirates are unacceptable for Xpert Xpress SARS-CoV-2/FLU/RSV testing.  Fact Sheet for Patients: EntrepreneurPulse.com.au  Fact Sheet for Healthcare Providers: IncredibleEmployment.be  This test is not yet approved or cleared by the [place] FDA and has been authorized for detection and/or diagnosis of SARS-CoV-2 by FDA under an Emergency Use Authorization (EUA). This EUA will remain in effect (meaning this test can be used) for the duration of the COVID-19 declaration under Section 564(b)(1) of the Act, 21 U.S.C. section 360bbb-3(b)(1), unless the authorization is terminated or revoked.  Performed at [facility], [address] [place], [place] [address]      Labs: BNP (last 3 results) Recent Labs    06/26/21 1203  BNP 64.4   Basic Metabolic Panel: Recent Labs  Lab 06/26/21 1203 06/27/21 1056 06/28/21 0451  NA 139 135 135  K 3.3* 4.1 4.2  CL 100 100 94*  CO2 26 23 27    GLUCOSE 86 176* 310*  BUN 25* 41* 28*  CREATININE 6.16* 9.68* 7.65*  CALCIUM 8.6* 8.9 9.5  MG  --   --  1.9  PHOS  --  5.3* 4.8*   Liver Function Tests: Recent Labs  Lab 06/26/21 1203 06/27/21 1056 06/28/21 0451  AST 23  --   --  ALT 37  --   --   ALKPHOS 114  --   --   BILITOT 0.7  --   --   PROT 7.5  --   --   ALBUMIN 4.2 3.9 4.2   No results for input(s): LIPASE, AMYLASE in the last 168 hours. No results for input(s): AMMONIA in the last 168 hours. CBC: Recent Labs  Lab 06/26/21 1203 06/27/21 0426 06/28/21 0451  WBC 9.3 9.1 9.6  NEUTROABS 6.7  --   --   HGB 9.6* 10.1* 10.6*  HCT 31.0* 34.7* 35.3*  MCV 93.4 97.5 95.4  PLT 254 261 275   Cardiac Enzymes: No results for input(s): CKTOTAL, CKMB, CKMBINDEX, TROPONINI in the last 168 hours. BNP: Invalid input(s): POCBNP CBG: Recent Labs  Lab 06/26/21 1742 06/26/21 2107 06/27/21 0710 06/27/21 1104 06/28/21 1113  GLUCAP 81 167* 117* 175* 273*   D-Dimer No results for input(s): DDIMER in the last 72 hours. Hgb A1c Recent Labs    06/26/21 1159  HGBA1C 8.3*   Lipid Profile No results for input(s): CHOL, HDL, LDLCALC, TRIG, CHOLHDL, LDLDIRECT in the last 72 hours. Thyroid function studies No results for input(s): TSH, T4TOTAL, T3FREE, THYROIDAB in the last 72 hours.  Invalid input(s): FREET3 Anemia work up No results for input(s): VITAMINB12, FOLATE, FERRITIN, TIBC, IRON, RETICCTPCT in the last 72 hours. Urinalysis    Component Value Date/Time   COLORURINE STRAW (A) 12/22/2018 0847   APPEARANCEUR CLEAR 12/22/2018 0847   LABSPEC 1.007 12/22/2018 0847   PHURINE 9.0 (H) 12/22/2018 0847   GLUCOSEU >=500 (A) 12/22/2018 0847   HGBUR NEGATIVE 12/22/2018 0847   [place] 12/22/2018 0847   KETONESUR NEGATIVE 12/22/2018 0847   PROTEINUR 100 (A) 12/22/2018 0847   NITRITE NEGATIVE 12/22/2018 0847   LEUKOCYTESUR NEGATIVE 12/22/2018 0847   Sepsis Labs Invalid input(s): PROCALCITONIN,  WBC,   LACTICIDVEN Microbiology Recent Results (from the past 240 hour(s))  Resp Panel by RT-PCR (Flu A&B, Covid) Nasopharyngeal Swab     Status: None   Collection Time: 06/26/21 11:26 AM   Specimen: Nasopharyngeal Swab; Nasopharyngeal(NP) swabs in vial transport medium  Result Value Ref Range Status   SARS Coronavirus 2 by RT PCR NEGATIVE NEGATIVE Final    Comment: (NOTE) SARS-CoV-2 target nucleic acids are NOT DETECTED.  The SARS-CoV-2 RNA is generally detectable in upper respiratory specimens during the acute phase of infection. The lowest concentration of SARS-CoV-2 viral copies this assay can detect is 138 copies/mL. A negative result does not preclude SARS-Cov-2 infection and should not be used as the sole basis for treatment or other patient management decisions. A negative result may occur with  improper specimen collection/handling, submission of specimen other than nasopharyngeal swab, presence of viral mutation(s) within the areas targeted by this assay, and inadequate number of viral copies(<138 copies/mL). A negative result must be combined with clinical observations, patient history, and epidemiological information. The expected result is Negative.  Fact Sheet for Patients:  EntrepreneurPulse.com.au  Fact Sheet for Healthcare Providers:  IncredibleEmployment.be  This test is no t yet approved or cleared by the [place] FDA and  has been authorized for detection and/or diagnosis of SARS-CoV-2 by FDA under an Emergency Use Authorization (EUA). This EUA will remain  in effect (meaning this test can be used) for the duration of the COVID-19 declaration under Section 564(b)(1) of the Act, 21 U.S.C.section 360bbb-3(b)(1), unless the authorization is terminated  or revoked sooner.       Influenza A by PCR NEGATIVE  NEGATIVE Final   Influenza B by PCR NEGATIVE NEGATIVE Final    Comment: (NOTE) The Xpert Xpress SARS-CoV-2/FLU/RSV plus  assay is intended as an aid in the diagnosis of influenza from Nasopharyngeal swab specimens and should not be used as a sole basis for treatment. Nasal washings and aspirates are unacceptable for Xpert Xpress SARS-CoV-2/FLU/RSV testing.  Fact Sheet for Patients: EntrepreneurPulse.com.au  Fact Sheet for Healthcare Providers: IncredibleEmployment.be  This test is not yet approved or cleared by the [place] FDA and has been authorized for detection and/or diagnosis of SARS-CoV-2 by FDA under an Emergency Use Authorization (EUA). This EUA will remain in effect (meaning this test can be used) for the duration of the COVID-19 declaration under Section 564(b)(1) of the Act, 21 U.S.C. section 360bbb-3(b)(1), unless the authorization is terminated or revoked.  Performed at [facility], [address] [place], [place] [address]      Time coordinating discharge: 35 minutes  SIGNED:   Rodena Goldmann, DO [facility] 06/28/2021, 11:33 AM  If 7PM-7AM, please contact night-coverage [email or web address]

## 2021-06-28 NOTE — Progress Notes (Signed)
NAMEKerryn Tennant, MRN:  [id number], DOB:  1971/04/15, LOS: 0 ADMISSION DATE:  06/26/2021, CONSULTATION DATE:  06/27/21 REFERRING MD:  Wynetta Emery, [facility], CHIEF COMPLAINT:  sob    History of Present Illness:  41  yobf never smoker  with medical history significant for  ESRD, DM, HTN, Diastolic CHF and CXKGY-18 infection 12/13/19.  Patient presented to ED with complaints of difficulty breathing on exertion and chest pain.  Patient reports difficulty breathing has been ongoing since she had a revision of her HD access fistula back in April/may 202  ( No details in Epic/care everywhere). She reports she unable to walk 10 feet without getting short of breath.  She reports improvement in her symptoms with her inhaler.  She also reports nonradiating, central and intermittent chest pain, that is provoked mostly by movement and coughing.  She reports occasional chest pains with exertion. No change in weight, no leg swelling.  She has not been very active due to right ankle problems and difficulty breathing.  2 days PTA symptoms significantly worsen, she waited to today to get dialyzed taking it to help with her symptoms, but with persistence of symptoms despite completing full session of dialysis patient presented to the ED. She has been on dialysis since 2015, and makes small amount of urine.   ED Course: O2 sats 83% on room air, currently on 2.5 L sats greater than 96%.  Blood pressure 150s to 170s.  Heart rate 70s.  Troponins 24 > 26.  Unremarkable CBC, potassium 3.3.  BNP 73.  Chest x-ray showed pulmonary vascular congestion.     Additional hx obtained 7/12;  2 weeks of progressive doe assoc with nasal congestion s purulent secretions and dyphagia/ choking sensation esp with eating rice.  No fever or abd pain or significant new leg swellling.  Not really better p saba or HD while on labetolol as outpt.  (Got sob car to HD waiting area, took saba, same problem w/in 30 min walking waiting area to HD and better  again after rested, this time s saba, in same amt of time and same symptom of doe, also same doe p HD.      Significant Hospital Events: Including procedures, antibiotic start and stop dates in addition to other pertinent events   Echo 7/12:   grade 2 diastolic dysfunction with Mild LAE, mild MR and RV sys low nl with est RVSP 30   Scheduled Meds:  heparin sodium (porcine)       bisoprolol  5 mg Oral BID   Chlorhexidine Gluconate Cloth  6 each Topical Q0600   heparin  5,000 Units Subcutaneous Q8H   insulin aspart  0-5 Units Subcutaneous QHS   insulin aspart  0-6 Units Subcutaneous TID WC   ipratropium-albuterol  3 mL Nebulization TID   linaclotide  290 mcg Oral QAC breakfast   mometasone-formoterol  2 puff Inhalation BID   multivitamin  1 tablet Oral Daily   pantoprazole  40 mg Oral BID AC   rOPINIRole  1 mg Oral BID   sevelamer carbonate  1,600 mg Oral BID WC   sevelamer carbonate  2,400 mg Oral Q lunch   Continuous Infusions:   PRN Meds:.acetaminophen **OR** acetaminophen, albuterol, ondansetron **OR** ondansetron (ZOFRAN) IV, polyethylene glycol     Interim History / Subjective:  Still desats on RA   Objective   Blood pressure (!) 155/77, pulse 72, temperature 98.1 F (36.7 C), temperature source Oral, resp. rate 16, height 5\' 3"  (1.6 m),  weight 110.3 kg, SpO2 98 %.        Intake/Output Summary (Last 24 hours) at 06/28/2021 1339 Last data filed at 06/28/2021 0900 Gross per 24 hour  Intake 480 ml  Output 2300 ml  Net -1820 ml   Filed Weights   06/27/21 1405 06/27/21 1740 06/28/21 1155  Weight: 112 kg 109.9 kg 110.3 kg    Examination: Tmax    98.4    General appearance:    obese bf nad at 30 degrees   At Rest 02 sats  98% on 2lpm   No jvd Oropharynx clear,  mucosa nl Neck supple Lungs with distant bilaterally RRR no s3 or or sign murmur Abd obese with limited  excursion  Extr warm with no edema or clubbing noted Neuro  Sensorium intact,  no apparent  motor deficits    I personally reviewed images and agree with radiology impression as follows:  CTa chest 06/26/21 1. Negative examination for pulmonary embolism. 2. 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 3. Cardiomegaly and coronary artery disease.      Assessment & Plan:  1) Unexplained doe/ hypoxemia with main previous findings c/w obesity/ restriction, not obstruction as evidenced by last pfts 01/09/18 prior to covid   DDX of  difficult airways management almost all start with A and  include Adherence, Ace Inhibitors, Acid Reflux, Active Sinus Disease, Alpha 1 Antitripsin deficiency, Anxiety masquerading as Airways dz,  ABPA,  Allergy(esp in young), Aspiration (esp in elderly), Adverse effects of meds,  Active smoking or vaping, A bunch of PE's (a small clot burden can't cause this syndrome unless there is already severe underlying pulm or vascular dz with poor reserve) plus two Bs  = Bronchiectasis and Beta blocker use..and one C= CHF  Rec: Changed labetolol to bisoprolol Continue max rx for gerd Check sinus CT> neg paranasal sinus dz Rec UP walking as much as possible and out of bed using IS and check sats sitting and walking to assure > 90%        2) HBP In the setting of respiratory symptoms of unknown etiology,  It would be preferable to use bystolic, the most beta -1  selective Beta blocker available in sample form, with bisoprolol the most selective generic choice  on the market, at least on a trial basis, to make sure the spillover Beta 2 effects of the less specific Beta blockers are not contributing to this patient's symptoms.  >>> continue bisoprolol, add hydralazine or cardura if afterload reduction desired    Pulmonary f/u as outpt prn (she has my card if wants my help,  though I though I did not detect much motivation on her part to do so and understand she has stopped 02 on her own at home before.  Labs   CBC: Recent Labs  Lab 06/26/21 1203 06/27/21 0426 06/28/21 0451  WBC 9.3 9.1 9.6  NEUTROABS 6.7  --   --   HGB 9.6* 10.1* 10.6*  HCT 31.0* 34.7* 35.3*  MCV 93.4 97.5 95.4  PLT 254 261 283    Basic Metabolic Panel: Recent Labs  Lab 06/26/21 1203 06/27/21 1056 06/28/21 0451  NA 139 135 135  K 3.3* 4.1 4.2  CL 100 100 94*  CO2 26 23 27   GLUCOSE 86 176* 310*  BUN 25* 41* 28*  CREATININE 6.16* 9.68*  7.65*  CALCIUM 8.6* 8.9 9.5  MG  --   --  1.9  PHOS  --  5.3* 4.8*   GFR: Estimated Creatinine Clearance: 10.4 mL/min (A) (by C-G formula based on SCr of 7.65 mg/dL (H)). Recent Labs  Lab 06/26/21 1203 06/27/21 0426 06/28/21 0451  WBC 9.3 9.1 9.6    Liver Function Tests: Recent Labs  Lab 06/26/21 1203 06/27/21 1056 06/28/21 0451  AST 23  --   --   ALT 37  --   --   ALKPHOS 114  --   --   BILITOT 0.7  --   --   PROT 7.5  --   --   ALBUMIN 4.2 3.9 4.2   No results for input(s): LIPASE, AMYLASE in the last 168 hours. No results for input(s): AMMONIA in the last 168 hours.  ABG    Component Value Date/Time   PHART 7.513 (H) 02/24/2018 1810   PCO2ART 35.4 02/24/2018 1810   PO2ART 52.5 (L) 02/24/2018 1810   HCO3 19.3 (L) 01/11/2020 0011   TCO2 29 09/27/2016 1245   ACIDBASEDEF 5.7 (H) 01/11/2020 0011   O2SAT 73.0 01/11/2020 0011     Coagulation Profile: Recent Labs  Lab 06/26/21 1203  INR 1.2    Cardiac Enzymes: No results for input(s): CKTOTAL, CKMB, CKMBINDEX, TROPONINI in the last 168 hours.  HbA1C: Hgb A1c MFr Bld  Date/Time Value Ref Range Status  06/26/2021 11:59 AM 8.3 (H) 4.8 - 5.6 % Final    Comment:    (NOTE) Pre diabetes:          5.7%-6.4%  Diabetes:              >6.4%  Glycemic control for   <7.0% adults with diabetes   08/04/2020 04:52 AM 8.4 (H) 4.8 - 5.6 % Final    Comment:     (NOTE) Pre diabetes:          5.7%-6.4%  Diabetes:              >6.4%  Glycemic control for   <7.0% adults with diabetes     CBG: Recent Labs  Lab 06/26/21 1742 06/26/21 2107 06/27/21 0710 06/27/21 1104 06/28/21 1113  GLUCAP 81 167* 117* 175* 273*        Christinia Gully, MD [facility] [place] [phone]   After 7:00 pm call Elink  [phone]

## 2021-06-28 NOTE — Progress Notes (Signed)
Patient ID: Patricia [name], female   DOB: 1971/06/25, 50 y.o.   MRN: [id number] S: Tolerated 2.3 liters UF with HD yesterday without significant improvement of DOE or hypoxia. O:BP (!) 141/64   Pulse 75   Temp 97.9 F (36.6 C) (Oral)   Resp 17   Ht 5\' 3"  (1.6 m)   Wt 109.9 kg   SpO2 97%   BMI 42.92 kg/m   Intake/Output Summary (Last 24 hours) at 06/28/2021 1135 Last data filed at 06/28/2021 0900 Gross per 24 hour  Intake 720 ml  Output 2300 ml  Net -1580 ml   Intake/Output: I/O last 3 completed shifts: In: 840 [P.O.:840] Out: 2300 [Other:2300]  Intake/Output this shift:  Total I/O In: 240 [P.O.:240] Out: -  Weight change: 0.869 kg Gen:NAD CVS:RRR Resp: CTA Abd: +BS, soft, NT/ND Ext: no edema, LUE AVF +T/B  Recent Labs  Lab 06/26/21 1203 06/27/21 1056 06/28/21 0451  NA 139 135 135  K 3.3* 4.1 4.2  CL 100 100 94*  CO2 26 23 27   GLUCOSE 86 176* 310*  BUN 25* 41* 28*  CREATININE 6.16* 9.68* 7.65*  ALBUMIN 4.2 3.9 4.2  CALCIUM 8.6* 8.9 9.5  PHOS  --  5.3* 4.8*  AST 23  --   --   ALT 37  --   --    Liver Function Tests: Recent Labs  Lab 06/26/21 1203 06/27/21 1056 06/28/21 0451  AST 23  --   --   ALT 37  --   --   ALKPHOS 114  --   --   BILITOT 0.7  --   --   PROT 7.5  --   --   ALBUMIN 4.2 3.9 4.2   No results for input(s): LIPASE, AMYLASE in the last 168 hours. No results for input(s): AMMONIA in the last 168 hours. CBC: Recent Labs  Lab 06/26/21 1203 06/27/21 0426 06/28/21 0451  WBC 9.3 9.1 9.6  NEUTROABS 6.7  --   --   HGB 9.6* 10.1* 10.6*  HCT 31.0* 34.7* 35.3*  MCV 93.4 97.5 95.4  PLT 254 261 275   Cardiac Enzymes: No results for input(s): CKTOTAL, CKMB, CKMBINDEX, TROPONINI in the last 168 hours. CBG: Recent Labs  Lab 06/26/21 1742 06/26/21 2107 06/27/21 0710 06/27/21 1104 06/28/21 1113  GLUCAP 81 167* 117* 175* 273*    Iron Studies: No results for input(s): IRON, TIBC, TRANSFERRIN, FERRITIN in the last 72  hours. Studies/Results: CT Angio Chest Pulmonary Embolism (PE) W or WO Contrast  Result Date: 06/26/2021 CLINICAL DATA:  Dyspnea, chest pain, hypoxia EXAM: CT ANGIOGRAPHY CHEST WITH CONTRAST TECHNIQUE: Multidetector CT imaging of the chest was performed using the standard protocol during bolus administration of intravenous contrast. Multiplanar CT image reconstructions and MIPs were obtained to evaluate the vascular anatomy. CONTRAST:  153mL OMNIPAQUE IOHEXOL 350 MG/ML SOLN COMPARISON:  12/22/2018 FINDINGS: Cardiovascular: Satisfactory opacification of the pulmonary arteries to the segmental level. No evidence of pulmonary embolism. Cardiomegaly. Left coronary artery calcifications. No pericardial effusion. Aortic atherosclerosis. Mediastinum/Nodes: Numerous prominent mediastinal lymph nodes, unchanged. Thyroid gland, trachea, and esophagus demonstrate no significant findings. Lungs/Pleura: Mosaic attenuation of the airspaces with bandlike scarring and or partial atelectasis of the left greater than right dependent lung bases. No pleural effusion or pneumothorax. Upper Abdomen: No acute abnormality. Benign adenomatous thickening of the bilateral adrenal glands. Musculoskeletal: No chest wall abnormality. No acute or significant osseous findings. Review of the MIP images confirms the above findings. IMPRESSION: 1. Negative examination for pulmonary embolism.  2. 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 3. Cardiomegaly and coronary artery disease. Aortic Atherosclerosis (ICD10-I70.0). Electronically Signed   By: Eddie Candle M.D.   On: 06/26/2021 16:50   DG Chest Portable 1 View  Result Date: 06/26/2021 CLINICAL DATA:  Shortness of breath EXAM: PORTABLE CHEST 1 VIEW COMPARISON:  08/03/2020 FINDINGS: Mild pulmonary  vascular congestion. No pleural effusion. No pneumothorax. Cardiomegaly. IMPRESSION: Pulmonary vascular congestion.  Cardiomegaly. Electronically Signed   By: Macy Mis M.D.   On: 06/26/2021 12:10   ECHOCARDIOGRAM COMPLETE  Result Date: 06/27/2021    ECHOCARDIOGRAM REPORT   Patient Name:   [facility] Date of Exam: 06/27/2021 Medical Rec #:  [id number]   Height:       63.0 in Accession #:    [id number]  Weight:       245.0 lb Date of Birth:  [birth year]  BSA:          2.108 m Patient Age:    59 years    BP:           162/85 mmHg Patient Gender: F           HR:           72 bpm. Exam Location:  [facility] Procedure: 2D Echo, Cardiac Doppler and Color Doppler Indications:    Dyspnea  History:        Patient has prior history of Echocardiogram examinations, most                 recent 08/04/2020. CHF, Signs/Symptoms:Syncope, Shortness of                 Breath and Chest Pain; Risk Factors:Diabetes. Breast CA.  Sonographer:    Wenda Low Referring Phys: [place]  1. Left ventricular ejection fraction, by estimation, is 55 to 60%. The left ventricle has normal function. The left ventricle has no regional wall motion abnormalities. There is mild left ventricular hypertrophy. Left ventricular diastolic parameters are consistent with Grade II diastolic dysfunction (pseudonormalization).  2. Right ventricular systolic function is low normal. The right ventricular size is normal. There is normal pulmonary artery systolic pressure. The estimated right ventricular systolic pressure is 23.7 mmHg.  3. Left atrial size was mildly dilated.  4. There is a trivial pericardial effusion posterior to the left ventricle.  5. The mitral valve is grossly normal. Mild mitral valve regurgitation.  6. The aortic valve is tricuspid. Aortic valve regurgitation is not visualized. Mild to moderate aortic valve sclerosis/calcification is present, without any evidence of aortic stenosis. Aortic valve mean  gradient measures 7.0 mmHg.  7. The inferior vena cava is normal in size with greater than 50% respiratory variability, suggesting right atrial pressure of 3 mmHg. FINDINGS  Left Ventricle: Left ventricular ejection fraction, by estimation, is 55 to 60%. The left ventricle has normal function. The left ventricle has no regional wall motion abnormalities. The left ventricular internal cavity size was normal in size. There is  mild left ventricular hypertrophy. Left ventricular diastolic parameters are consistent with Grade II diastolic dysfunction (pseudonormalization). Right Ventricle: The right ventricular size is normal. No increase in right ventricular wall thickness. Right ventricular systolic function is low normal. There is normal pulmonary artery systolic pressure. The tricuspid regurgitant velocity is 2.58 m/s,  and with an assumed right  atrial pressure of 3 mmHg, the estimated right ventricular systolic pressure is 99.3 mmHg. Left Atrium: Left atrial size was mildly dilated. Right Atrium: Right atrial size was normal in size. Pericardium: Trivial pericardial effusion is present. The pericardial effusion is posterior to the left ventricle. Mitral Valve: The mitral valve is grossly normal. Mild mitral annular calcification. Mild mitral valve regurgitation. MV peak gradient, 6.1 mmHg. The mean mitral valve gradient is 3.0 mmHg. Tricuspid Valve: The tricuspid valve is grossly normal. Tricuspid valve regurgitation is mild. Aortic Valve: The aortic valve is tricuspid. There is mild to moderate aortic valve annular calcification. Aortic valve regurgitation is not visualized. Mild to moderate aortic valve sclerosis/calcification is present, without any evidence of aortic stenosis. Aortic valve mean gradient measures 7.0 mmHg. Aortic valve peak gradient measures 13.5 mmHg. Aortic valve area, by VTI measures 2.04 cm. Pulmonic Valve: The pulmonic valve was grossly normal. Pulmonic valve regurgitation is trivial.  Aorta: The aortic root is normal in size and structure. Venous: The inferior vena cava is normal in size with greater than 50% respiratory variability, suggesting right atrial pressure of 3 mmHg. IAS/Shunts: No atrial level shunt detected by color flow Doppler.  LEFT VENTRICLE PLAX 2D LVIDd:         4.63 cm  Diastology LVIDs:         3.32 cm  LV e' medial:    4.73 cm/s LV PW:         1.34 cm  LV E/e' medial:  22.0 LV IVS:        1.11 cm  LV e' lateral:   6.75 cm/s LVOT diam:     1.90 cm  LV E/e' lateral: 15.4 LV SV:         74 LV SV Index:   35 LVOT Area:     2.84 cm  RIGHT VENTRICLE RV Basal diam:  4.04 cm RV Mid diam:    4.38 cm RV S prime:     14.70 cm/s TAPSE (M-mode): 3.2 cm LEFT ATRIUM             Index       RIGHT ATRIUM           Index LA diam:        4.40 cm 2.09 cm/m  RA Area:     18.70 cm LA Vol (A2C):   73.8 ml 35.01 ml/m RA Volume:   49.60 ml  23.53 ml/m LA Vol (A4C):   65.2 ml 30.93 ml/m LA Biplane Vol: 73.4 ml 34.82 ml/m  AORTIC VALVE AV Area (Vmax):    2.05 cm AV Area (Vmean):   1.85 cm AV Area (VTI):     2.04 cm AV Vmax:           184.00 cm/s AV Vmean:          120.000 cm/s AV VTI:            0.361 m AV Peak Grad:      13.5 mmHg AV Mean Grad:      7.0 mmHg LVOT Vmax:         133.00 cm/s LVOT Vmean:        78.100 cm/s LVOT VTI:          0.260 m LVOT/AV VTI ratio: 0.72  AORTA Ao Root diam: 2.50 cm MITRAL VALVE                TRICUSPID VALVE MV Area (PHT): 2.74 cm     TR  Peak grad:   26.6 mmHg MV Area VTI:   1.68 cm     TR Vmax:        258.00 cm/s MV Peak grad:  6.1 mmHg MV Mean grad:  3.0 mmHg     SHUNTS MV Vmax:       1.23 m/s     Systemic VTI:  0.26 m MV Vmean:      76.9 cm/s    Systemic Diam: 1.90 cm MV Decel Time: 277 msec MV E velocity: 104.00 cm/s MV A velocity: 99.80 cm/s MV E/A ratio:  1.04 Rozann Lesches MD Electronically signed by Rozann Lesches MD Signature Date/Time: 06/27/2021/4:16:53 PM    Final    CT MAXILLOFACIAL WO CONTRAST  Result Date: 06/27/2021 CLINICAL DATA:   Cough and chronic sinus disease. EXAM: CT MAXILLOFACIAL WITHOUT CONTRAST TECHNIQUE: Multidetector CT imaging of the maxillofacial structures was performed. Multiplanar CT image reconstructions were also generated. COMPARISON:  Head CT 08/03/2020 FINDINGS: Osseous: No facial fracture. Dental: Unremarkable appearance of the teeth, mandible and hard palate. Orbits: The globes are intact. Normal appearance of the intra- and extraconal fat. Symmetric extraocular muscles. Sinuses: No fluid levels or advanced mucosal thickening. Bilateral mastoid fluid, left-greater-than-right. Opacification of the left middle ear. Fluid was present bilaterally and in the left middle ear on 08/03/2020. Soft tissues: Enlarged and heterogeneous thyroid gland with dominant nodule measuring 3.0 cm. Limited intracranial: Normal. IMPRESSION: 1. No acute finding. 2. Bilateral mastoid fluid, left-greater-than-right, with chronic opacification of the left middle ear. 3. Enlarged and heterogeneous thyroid gland with dominant nodule measuring 3.0 cm. Recommend thyroid US (ref: J Am Coll Radiol. 2015 Feb;12(2): 143-50). Electronically Signed   By: Ulyses Jarred M.D.   On: 06/27/2021 19:40    bisoprolol  5 mg Oral BID   Chlorhexidine Gluconate Cloth  6 each Topical Q0600   heparin  5,000 Units Subcutaneous Q8H   insulin aspart  0-5 Units Subcutaneous QHS   insulin aspart  0-6 Units Subcutaneous TID WC   ipratropium-albuterol  3 mL Nebulization TID   linaclotide  290 mcg Oral QAC breakfast   mometasone-formoterol  2 puff Inhalation BID   multivitamin  1 tablet Oral Daily   pantoprazole  40 mg Oral BID AC   rOPINIRole  1 mg Oral BID   sevelamer carbonate  1,600 mg Oral BID WC   sevelamer carbonate  2,400 mg Oral Q lunch    BMET    Component Value Date/Time   NA 135 06/28/2021 0451   K 4.2 06/28/2021 0451   CL 94 (L) 06/28/2021 0451   CO2 27 06/28/2021 0451   GLUCOSE 310 (H) 06/28/2021 0451   BUN 28 (H) 06/28/2021 0451    CREATININE 7.65 (H) 06/28/2021 0451   CREATININE 5.93 (H) 06/29/2019 1210   CALCIUM 9.5 06/28/2021 0451   GFRNONAA 6 (L) 06/28/2021 0451   GFRNONAA 8 (L) 06/29/2019 1210   GFRAA 4 (L) 08/04/2020 0452   GFRAA 9 (L) 06/29/2019 1210   CBC    Component Value Date/Time   WBC 9.6 06/28/2021 0451   RBC 3.70 (L) 06/28/2021 0451   HGB 10.6 (L) 06/28/2021 0451   HCT 35.3 (L) 06/28/2021 0451   PLT 275 06/28/2021 0451   MCV 95.4 06/28/2021 0451   MCH 28.6 06/28/2021 0451   MCHC 30.0 06/28/2021 0451   RDW 14.6 06/28/2021 0451   LYMPHSABS 1.9 06/26/2021 1203   MONOABS 0.5 06/26/2021 1203   EOSABS 0.2 06/26/2021 1203   BASOSABS 0.0 06/26/2021 1203  Dialysis Orders: Outpt HD: [facility] MWF Na modeling:  linear 148. Time: 4h 52min   BFR/DFR: 400/600   EDW: 111 kg   Hep 1000+ 1800/hr  2/2.5 bath L AVF EPO 3600   Units IV/HD  Hectoral 12 mcg IVP with HD   Assessment/Plan:  Acute hypoxic respiratory failure - No significant improvement despite extra HD session and more UF.  Seen by Pulmonology and changed labetalol to bisoprolol, increased PPI and ordered CT of sinus w/o active sinus infection.  SpO2 remains low and requiring oxygen.  ECHO without pulm HTN and grade II DD but normal EF.  Plan per primary and pulmonology.  ESRD -  plan for HD today to keep outpatient schedule.  Will need new lower EDW after HD today (was 109.9 after HD yesterday).  Hypertension/volume  - as above.  Also off of procardia due to pulmonary issues.  Anemia  - stable on ESA  Metabolic bone disease -  continue with home meds  Nutrition -  renal diet, carb modified. Diastolic CHF - no peripheral edema and low BNP. OSA - continue with CPAP qhs DM - per primary  Donetta Potts, MD [facility] [phone]

## 2021-06-28 NOTE — Progress Notes (Signed)
Patient o2 removed. Patient dropped to 85% <5 mins. Patient was placed on 2l/[place], returned to 96%. During this time, patient was just sitting on the side of the bed.

## 2021-06-29 LAB — GLUCOSE, CAPILLARY
Glucose-Capillary: 162 mg/dL — ABNORMAL HIGH (ref 70–99)
Glucose-Capillary: 218 mg/dL — ABNORMAL HIGH (ref 70–99)
Glucose-Capillary: 276 mg/dL — ABNORMAL HIGH (ref 70–99)

## 2021-06-30 DIAGNOSIS — D631 Anemia in chronic kidney disease: Secondary | ICD-10-CM | POA: Diagnosis not present

## 2021-06-30 DIAGNOSIS — N185 Chronic kidney disease, stage 5: Secondary | ICD-10-CM | POA: Diagnosis not present

## 2021-06-30 DIAGNOSIS — R0902 Hypoxemia: Secondary | ICD-10-CM | POA: Diagnosis not present

## 2021-06-30 DIAGNOSIS — Z6841 Body Mass Index (BMI) 40.0 and over, adult: Secondary | ICD-10-CM | POA: Diagnosis not present

## 2021-06-30 DIAGNOSIS — D509 Iron deficiency anemia, unspecified: Secondary | ICD-10-CM | POA: Diagnosis not present

## 2021-06-30 DIAGNOSIS — N186 End stage renal disease: Secondary | ICD-10-CM | POA: Diagnosis not present

## 2021-06-30 DIAGNOSIS — E7849 Other hyperlipidemia: Secondary | ICD-10-CM | POA: Diagnosis not present

## 2021-06-30 DIAGNOSIS — I10 Essential (primary) hypertension: Secondary | ICD-10-CM | POA: Diagnosis not present

## 2021-06-30 DIAGNOSIS — N2581 Secondary hyperparathyroidism of renal origin: Secondary | ICD-10-CM | POA: Diagnosis not present

## 2021-06-30 DIAGNOSIS — Z992 Dependence on renal dialysis: Secondary | ICD-10-CM | POA: Diagnosis not present

## 2021-06-30 DIAGNOSIS — E1129 Type 2 diabetes mellitus with other diabetic kidney complication: Secondary | ICD-10-CM | POA: Diagnosis not present

## 2021-06-30 DIAGNOSIS — K219 Gastro-esophageal reflux disease without esophagitis: Secondary | ICD-10-CM | POA: Diagnosis not present

## 2021-07-03 DIAGNOSIS — D509 Iron deficiency anemia, unspecified: Secondary | ICD-10-CM | POA: Diagnosis not present

## 2021-07-03 DIAGNOSIS — N186 End stage renal disease: Secondary | ICD-10-CM | POA: Diagnosis not present

## 2021-07-03 DIAGNOSIS — D631 Anemia in chronic kidney disease: Secondary | ICD-10-CM | POA: Diagnosis not present

## 2021-07-03 DIAGNOSIS — N2581 Secondary hyperparathyroidism of renal origin: Secondary | ICD-10-CM | POA: Diagnosis not present

## 2021-07-03 DIAGNOSIS — Z992 Dependence on renal dialysis: Secondary | ICD-10-CM | POA: Diagnosis not present

## 2021-07-05 DIAGNOSIS — D509 Iron deficiency anemia, unspecified: Secondary | ICD-10-CM | POA: Diagnosis not present

## 2021-07-05 DIAGNOSIS — N2581 Secondary hyperparathyroidism of renal origin: Secondary | ICD-10-CM | POA: Diagnosis not present

## 2021-07-05 DIAGNOSIS — D631 Anemia in chronic kidney disease: Secondary | ICD-10-CM | POA: Diagnosis not present

## 2021-07-05 DIAGNOSIS — N186 End stage renal disease: Secondary | ICD-10-CM | POA: Diagnosis not present

## 2021-07-05 DIAGNOSIS — Z992 Dependence on renal dialysis: Secondary | ICD-10-CM | POA: Diagnosis not present

## 2021-07-07 DIAGNOSIS — Z992 Dependence on renal dialysis: Secondary | ICD-10-CM | POA: Diagnosis not present

## 2021-07-07 DIAGNOSIS — D509 Iron deficiency anemia, unspecified: Secondary | ICD-10-CM | POA: Diagnosis not present

## 2021-07-07 DIAGNOSIS — N186 End stage renal disease: Secondary | ICD-10-CM | POA: Diagnosis not present

## 2021-07-07 DIAGNOSIS — N2581 Secondary hyperparathyroidism of renal origin: Secondary | ICD-10-CM | POA: Diagnosis not present

## 2021-07-07 DIAGNOSIS — D631 Anemia in chronic kidney disease: Secondary | ICD-10-CM | POA: Diagnosis not present

## 2021-07-10 DIAGNOSIS — D509 Iron deficiency anemia, unspecified: Secondary | ICD-10-CM | POA: Diagnosis not present

## 2021-07-10 DIAGNOSIS — D631 Anemia in chronic kidney disease: Secondary | ICD-10-CM | POA: Diagnosis not present

## 2021-07-10 DIAGNOSIS — Z992 Dependence on renal dialysis: Secondary | ICD-10-CM | POA: Diagnosis not present

## 2021-07-10 DIAGNOSIS — N2581 Secondary hyperparathyroidism of renal origin: Secondary | ICD-10-CM | POA: Diagnosis not present

## 2021-07-10 DIAGNOSIS — N186 End stage renal disease: Secondary | ICD-10-CM | POA: Diagnosis not present

## 2021-07-12 ENCOUNTER — Ambulatory Visit (INDEPENDENT_AMBULATORY_CARE_PROVIDER_SITE_OTHER): Payer: Medicare Other | Admitting: Pulmonary Disease

## 2021-07-12 ENCOUNTER — Other Ambulatory Visit: Payer: Self-pay

## 2021-07-12 ENCOUNTER — Encounter: Payer: Self-pay | Admitting: Pulmonary Disease

## 2021-07-12 VITALS — BP 140/78 | HR 72 | Temp 98.9°F | Ht 63.0 in | Wt 247.0 lb

## 2021-07-12 DIAGNOSIS — N2581 Secondary hyperparathyroidism of renal origin: Secondary | ICD-10-CM | POA: Diagnosis not present

## 2021-07-12 DIAGNOSIS — D509 Iron deficiency anemia, unspecified: Secondary | ICD-10-CM | POA: Diagnosis not present

## 2021-07-12 DIAGNOSIS — J9611 Chronic respiratory failure with hypoxia: Secondary | ICD-10-CM

## 2021-07-12 DIAGNOSIS — J329 Chronic sinusitis, unspecified: Secondary | ICD-10-CM

## 2021-07-12 DIAGNOSIS — Z992 Dependence on renal dialysis: Secondary | ICD-10-CM | POA: Diagnosis not present

## 2021-07-12 DIAGNOSIS — D631 Anemia in chronic kidney disease: Secondary | ICD-10-CM | POA: Diagnosis not present

## 2021-07-12 DIAGNOSIS — N186 End stage renal disease: Secondary | ICD-10-CM | POA: Diagnosis not present

## 2021-07-12 DIAGNOSIS — J219 Acute bronchiolitis, unspecified: Secondary | ICD-10-CM

## 2021-07-12 MED ORDER — BREZTRI AEROSPHERE 160-9-4.8 MCG/ACT IN AERO: 2.0000 | INHALATION_SPRAY | Freq: Two times a day (BID) | RESPIRATORY_TRACT | 0 refills | Status: DC

## 2021-07-12 MED ORDER — MONTELUKAST SODIUM 10 MG PO TABS: 10.0000 mg | ORAL_TABLET | Freq: Every day | ORAL | 11 refills | Status: DC

## 2021-07-12 MED ORDER — BREZTRI AEROSPHERE 160-9-4.8 MCG/ACT IN AERO: 2.0000 | INHALATION_SPRAY | Freq: Two times a day (BID) | RESPIRATORY_TRACT | 6 refills | Status: DC

## 2021-07-12 NOTE — Progress Notes (Signed)
Synopsis: Referred in July 2022 for hospital follow up by Heath Lark, DO  Subjective:   PATIENT ID: Patricia Schooner GENDER: female DOB: 02-22-1971, MRN: [id number]   HPI  Chief Complaint  Patient presents with   Consult    Patient reports was in the ER on 06/26/21 and having shortness of breath on exertion. She has some wheezing at times.     Patricia [name] is a 50 year old woman, never smoker with diabetes mellitus, GERD, hypertension, OSA, diastolic heart failure and ESRD on HD who is referred to pulmonary clinic for hospital follow up of acute respiratory failure.   She had covid in 12/2019 and reports she has been having trouble with her breathing since then. She was on oxygen after being hospitalized for covid and then successfully weaned off later last year until she was placed back on oxygen after she was admitted 06/26/21 to 06/28/21 for respiratory failure with SpO2 of 83% on room air in the ER.   She reports on going dyspnea since April/May but 2 weeks prior to her admission she developed nasal congestion with purulent secretions concerning for upper respiratory viral infection. CTA Chest was negative for pulmonary emboli. There was mosaic attenuation of the air spaces with bandlike scarring and possible atelectasis of the bilateral bases. No extended respiratory viral panel was done during her hospitalization and she was negative for influenza/covid. 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 She was on duonebs and mometasone-formoterol 2 puffs twice daily while in the hospital. Her beta blocker medications were changed from labetalol to bisoprolol to prevent bronchospasms. A CT sinus was checked with bilateral mastoid fluid, L > R. Opacification of the left middle ear.   She reports her breathing is somewhat better since hospitalization. She is currently using symbicort 80-4.52mcg 2 puffs twice daily and flovent HFA 145mcg 2 puffs daily. She is also using a  nebulizer treatment about 2-3 times per week for wheezing. She is using 2L supplemental O2 day/night. She reports her primary care has ordered her a CPAP machine from [place] and she had a home sleep study earlier this year.   Past Medical History:  Diagnosis Date   Anemia    Ankle fracture    Arthritis    Blood transfusion without reported diagnosis    Breast cancer ([place])    Cancer ([place])    Diabetes mellitus without complication ([place])    Dialysis patient ([place])    mon, wed, friday,    End stage renal disease on dialysis ([place])    M/W/F [facility] in [place]   GERD (gastroesophageal reflux disease)    Hypertension    OSA (obstructive sleep apnea)    uses CPAP sometimes   Pneumonia    PONV (postoperative nausea and vomiting)    Wears glasses      Family History  Problem Relation Age of Onset   Diabetes Mellitus II Mother    Hypertension Mother    Hypertension Sister    Hypertension Sister    Colon cancer Neg Hx      Social History   Socioeconomic History   Marital status: Widowed    Spouse name: Not on file   Number of children: Not on file   Years of education: Not on file   Highest education level: Not on file  Occupational History   Not on file  Tobacco Use   Smoking status: Never   Smokeless tobacco: Never  Vaping Use  Vaping Use: Never used  Substance and Sexual Activity   Alcohol use: No    Alcohol/week: 0.0 standard drinks   Drug use: No   Sexual activity: Not on file  Other Topics Concern   Not on file  Social History Narrative   Not on file   Social Determinants of Health   Financial Resource Strain: Not on file  Food Insecurity: Not on file  Transportation Needs: Not on file  Physical Activity: Not on file  Stress: Not on file  Social Connections: Not on file  Intimate Partner Violence: Not on file     Allergies  Allergen Reactions   Amlodipine Besylate Rash and Other (See Comments)    dizziness   Reglan [Metoclopramide]  Other (See Comments)    hallucinations      Outpatient Medications Prior to Visit  Medication Sig Dispense Refill   acetaminophen (TYLENOL) 325 MG tablet Take 650 mg by mouth every 6 (six) hours as needed.     albuterol (PROVENTIL HFA;VENTOLIN HFA) 108 (90 Base) MCG/ACT inhaler Inhale 2 puffs into the lungs every 6 (six) hours as needed for wheezing or shortness of breath. 1 Inhaler 2   albuterol (PROVENTIL) (2.5 MG/3ML) 0.083% nebulizer solution Take 3 mLs (2.5 mg total) by nebulization every 6 (six) hours as needed for wheezing or shortness of breath. 75 mL 12   bisoprolol (ZEBETA) 5 MG tablet Take 1 tablet (5 mg total) by mouth 2 (two) times daily. 60 tablet 2   cinacalcet (SENSIPAR) 60 MG tablet Take 30 mg by mouth daily with breakfast.     dicyclomine (BENTYL) 10 MG capsule Take 10 mg by mouth 2 (two) times daily as needed.     ferric citrate (AURYXIA) 1 GM 210 MG(Fe) tablet Take 2 tablets by mouth 2 (two) times daily with a meal. With Breakfast & with supper     FLOVENT HFA 110 MCG/ACT inhaler 1 puff 2 (two) times daily.     glipiZIDE (GLUCOTROL) 5 MG tablet Take 5 mg by mouth 2 (two) times daily as needed. For blood sugar levels over 150     ipratropium (ATROVENT) 0.03 % nasal spray Place 2 sprays into both nostrils 2 (two) times daily.      linaclotide (LINZESS) 290 MCG CAPS capsule Take 290 mcg by mouth daily before breakfast.     multivitamin (RENA-VIT) TABS tablet Take 1 tablet by mouth daily.     ondansetron (ZOFRAN-ODT) 4 MG disintegrating tablet Take 4 mg by mouth as needed for nausea or vomiting.      pantoprazole (PROTONIX) 40 MG tablet Take 1 tablet (40 mg total) by mouth daily. 30 minutes before breakfast 90 tablet 3   rOPINIRole (REQUIP XL) 2 MG 24 hr tablet Take 2 mg by mouth at bedtime.      sevelamer carbonate (RENVELA) 800 MG tablet Take 800 mg by mouth See admin instructions. Take 2 tablets (1600 mg) in the morning, take 3 tablets (2400 mg) with lunch or snack,  and take  2 tablets (1600 mg) by mouth in the evening with dinner meal.     sodium chloride (OCEAN) 0.65 % SOLN nasal spray Place 1 spray into both nostrils as needed for congestion. 60 mL 0   budesonide-formoterol (SYMBICORT) 80-4.5 MCG/ACT inhaler Inhale 2 puffs into the lungs 2 (two) times daily.     No facility-administered medications prior to visit.    Review of Systems  Constitutional:  Negative for chills, fever, malaise/fatigue and weight loss.  HENT:  Positive for congestion. Negative for sinus pain and sore throat.   Eyes: Negative.   Respiratory:  Positive for shortness of breath and wheezing. Negative for cough, hemoptysis and sputum production.   Cardiovascular:  Negative for chest pain, palpitations, orthopnea, claudication and leg swelling.  Gastrointestinal:  Negative for abdominal pain, heartburn, nausea and vomiting.  Genitourinary: Negative.   Musculoskeletal:  Negative for joint pain and myalgias.  Skin:  Negative for rash.  Neurological:  Negative for weakness.  Endo/Heme/Allergies: Negative.   Psychiatric/Behavioral: Negative.       Objective:   Vitals:   07/12/21 1601  BP: 140/78  Pulse: 72  Temp: 98.9 F (37.2 C)  TempSrc: Oral  SpO2: 96%  Weight: 247 lb (112 kg)  Height: 5\' 3"  (1.6 m)     Physical Exam Constitutional:      General: She is not in acute distress.    Appearance: She is obese. She is not ill-appearing.  HENT:     Head: Normocephalic and atraumatic.  Eyes:     General: No scleral icterus.    Conjunctiva/sclera: Conjunctivae normal.     Pupils: Pupils are equal, round, and reactive to light.  Cardiovascular:     Rate and Rhythm: Normal rate and regular rhythm.     Pulses: Normal pulses.     Heart sounds: Normal heart sounds. No murmur heard. Pulmonary:     Effort: Pulmonary effort is normal.     Breath sounds: Decreased breath sounds present. No wheezing, rhonchi or rales.  Abdominal:     General: Bowel sounds are normal.      Palpations: Abdomen is soft.  Musculoskeletal:     Right lower leg: No edema.     Left lower leg: No edema.  Lymphadenopathy:     Cervical: No cervical adenopathy.  Skin:    General: Skin is warm and dry.  Neurological:     General: No focal deficit present.     Mental Status: She is alert.  Psychiatric:        Mood and Affect: Mood normal.        Behavior: Behavior normal.        Thought Content: Thought content normal.        Judgment: Judgment normal.    CBC    Component Value Date/Time   WBC 9.6 06/28/2021 0451   RBC 3.70 (L) 06/28/2021 0451   HGB 10.6 (L) 06/28/2021 0451   HCT 35.3 (L) 06/28/2021 0451   PLT 275 06/28/2021 0451   MCV 95.4 06/28/2021 0451   MCH 28.6 06/28/2021 0451   MCHC 30.0 06/28/2021 0451   RDW 14.6 06/28/2021 0451   LYMPHSABS 1.9 06/26/2021 1203   MONOABS 0.5 06/26/2021 1203   EOSABS 0.2 06/26/2021 1203   BASOSABS 0.0 06/26/2021 1203   BMP Latest Ref Rng & Units 06/28/2021 06/27/2021 06/26/2021  Glucose 70 - 99 mg/dL 310(H) 176(H) 86  BUN 6 - 20 mg/dL 28(H) 41(H) 25(H)  Creatinine 0.44 - 1.00 mg/dL 7.65(H) 9.68(H) 6.16(H)  BUN/Creat Ratio 6 - 22 (calc) - - -  Sodium 135 - 145 mmol/L 135 135 139  Potassium 3.5 - 5.1 mmol/L 4.2 4.1 3.3(L)  Chloride 98 - 111 mmol/L 94(L) 100 100  CO2 22 - 32 mmol/L 27 23 26   Calcium 8.9 - 10.3 mg/dL 9.5 8.9 8.6(L)   Chest imaging: CTA Chest 06/26/21 1. Negative examination for pulmonary embolism. 2. Mosaic attenuation of the airspaces with bandlike scarring and or partial atelectasis of the left  greater than right dependent lung bases, findings consistent with sequelae of prior infection or inflammation and obliterative bronchiolitis. 3. Cardiomegaly and coronary artery disease.  PFT: PFT Results Latest Ref Rng & Units 01/09/2018 11/18/2017  FVC-Pre L 1.79 1.31  FVC-Predicted Pre % 63 46  FVC-Post L 1.81 -  FVC-Predicted Post % 64 -  Pre FEV1/FVC % % 88 86  Post FEV1/FCV % % 89 -  FEV1-Pre L 1.58 1.13   FEV1-Predicted Pre % 69 49  FEV1-Post L 1.60 -  DLCO uncorrected ml/min/mmHg 12.64 -  DLCO UNC% % 55 -  DLVA Predicted % 75 -  TLC L 3.26 -  TLC % Predicted % 66 -  RV % Predicted % 22 -   Echo 06/27/21: LVEF 55-60%. Grade II diastolic dysfunction. RVSP 29.4mmHg. RV systolic function is low normal. RV size is normal. LA is mildly dilated.   Assessment & Plan:   Bronchiolitis - Plan: Budeson-Glycopyrrol-Formoterol (BREZTRI AEROSPHERE) 160-9-4.8 MCG/ACT AERO, montelukast (SINGULAIR) 10 MG tablet, Pulmonary function test  Chronic respiratory failure with hypoxia (HCC) - Plan: Pulmonary function test  Chronic sinusitis, unspecified location - Plan: Ambulatory referral to ENT, Pulmonary function test  Discussion: Patricia [name] is a 50 year old woman, never smoker with diabetes mellitus, GERD, hypertension, OSA, diastolic heart failure and ESRD on HD who is referred to pulmonary clinic for hospital follow up of acute respiratory failure.   Her acute respiratory failure is likely secondary to a viral infection given the mosaic attenuation on her recent CT chest scan concerning for bronchiolitis. She also has untreated obstructed sleep apnea and obesity contributing to her dyspnea.   We will stop her flovent and low dose symbicort inhaler and start her on breztri inhaler therapy. I have instructed her to monitor her symptoms and if she notices increase in wheezing then she can add back her flovent for additional inhaled steroid therapy. Given her underlying diabetes that is not well controlled at this time I am avoid a steroid taper for the bronchiolitis.   We will set up an appointment at the [facility] clinic for her to establish care for her sleep apnea.   Weight loss has been encouraged.   She is to follow up in 2 months with pulmonary function tests. We will repeat a high resolution CT chest scan to better evaluate her lung parenchyma in the future.   She is to remain on  supplemental oxygen until the next visit.  Freda Jackson, MD [facility] Office: [phone]   Current Outpatient Medications:    acetaminophen (TYLENOL) 325 MG tablet, Take 650 mg by mouth every 6 (six) hours as needed., Disp: , Rfl:    albuterol (PROVENTIL HFA;VENTOLIN HFA) 108 (90 Base) MCG/ACT inhaler, Inhale 2 puffs into the lungs every 6 (six) hours as needed for wheezing or shortness of breath., Disp: 1 Inhaler, Rfl: 2   albuterol (PROVENTIL) (2.5 MG/3ML) 0.083% nebulizer solution, Take 3 mLs (2.5 mg total) by nebulization every 6 (six) hours as needed for wheezing or shortness of breath., Disp: 75 mL, Rfl: 12   bisoprolol (ZEBETA) 5 MG tablet, Take 1 tablet (5 mg total) by mouth 2 (two) times daily., Disp: 60 tablet, Rfl: 2   Budeson-Glycopyrrol-Formoterol (BREZTRI AEROSPHERE) 160-9-4.8 MCG/ACT AERO, Inhale 2 puffs into the lungs in the morning and at bedtime., Disp: 10.7 [name], Rfl: 6   Budeson-Glycopyrrol-Formoterol (BREZTRI AEROSPHERE) 160-9-4.8 MCG/ACT AERO, Inhale 2 puffs into the lungs in the morning and at bedtime., Disp: 10.7  [name], Rfl: 0   cinacalcet (SENSIPAR) 60 MG tablet, Take 30 mg by mouth daily with breakfast., Disp: , Rfl:    dicyclomine (BENTYL) 10 MG capsule, Take 10 mg by mouth 2 (two) times daily as needed., Disp: , Rfl:    ferric citrate (AURYXIA) 1 GM 210 MG(Fe) tablet, Take 2 tablets by mouth 2 (two) times daily with a meal. With Breakfast & with supper, Disp: , Rfl:    FLOVENT HFA 110 MCG/ACT inhaler, 1 puff 2 (two) times daily., Disp: , Rfl:    glipiZIDE (GLUCOTROL) 5 MG tablet, Take 5 mg by mouth 2 (two) times daily as needed. For blood sugar levels over 150, Disp: , Rfl:    ipratropium (ATROVENT) 0.03 % nasal spray, Place 2 sprays into both nostrils 2 (two) times daily. , Disp: , Rfl:    linaclotide (LINZESS) 290 MCG CAPS capsule, Take 290 mcg by mouth daily before breakfast., Disp: , Rfl:    montelukast (SINGULAIR) 10 MG tablet, Take 1 tablet  (10 mg total) by mouth at bedtime., Disp: 30 tablet, Rfl: 11   multivitamin (RENA-VIT) TABS tablet, Take 1 tablet by mouth daily., Disp: , Rfl:    ondansetron (ZOFRAN-ODT) 4 MG disintegrating tablet, Take 4 mg by mouth as needed for nausea or vomiting. , Disp: , Rfl:    pantoprazole (PROTONIX) 40 MG tablet, Take 1 tablet (40 mg total) by mouth daily. 30 minutes before breakfast, Disp: 90 tablet, Rfl: 3   rOPINIRole (REQUIP XL) 2 MG 24 hr tablet, Take 2 mg by mouth at bedtime. , Disp: , Rfl:    sevelamer carbonate (RENVELA) 800 MG tablet, Take 800 mg by mouth See admin instructions. Take 2 tablets (1600 mg) in the morning, take 3 tablets (2400 mg) with lunch or snack,  and take 2 tablets (1600 mg) by mouth in the evening with dinner meal., Disp: , Rfl:    sodium chloride (OCEAN) 0.65 % SOLN nasal spray, Place 1 spray into both nostrils as needed for congestion., Disp: 60 mL, Rfl: 0

## 2021-07-12 NOTE — Patient Instructions (Addendum)
Please schedule patient for sleep medicine appointment in [facility] Inhaler 2 puffs twice daily  Stop Flovent HFA and Symbicort Inhalers  If you have increased wheezing and shortness of breath after 2 weeks on the Breztri inhaler, you can add back the Flovent [facility] inhaler for further symptom relief.  We will hold of on oral steroids given your diabetes.   We will schedule you for pulmonary function tests in 2 months with follow.

## 2021-07-13 ENCOUNTER — Encounter: Payer: Self-pay | Admitting: Pulmonary Disease

## 2021-07-14 DIAGNOSIS — D509 Iron deficiency anemia, unspecified: Secondary | ICD-10-CM | POA: Diagnosis not present

## 2021-07-14 DIAGNOSIS — N2581 Secondary hyperparathyroidism of renal origin: Secondary | ICD-10-CM | POA: Diagnosis not present

## 2021-07-14 DIAGNOSIS — Z992 Dependence on renal dialysis: Secondary | ICD-10-CM | POA: Diagnosis not present

## 2021-07-14 DIAGNOSIS — N186 End stage renal disease: Secondary | ICD-10-CM | POA: Diagnosis not present

## 2021-07-14 DIAGNOSIS — D631 Anemia in chronic kidney disease: Secondary | ICD-10-CM | POA: Diagnosis not present

## 2021-07-16 DIAGNOSIS — I10 Essential (primary) hypertension: Secondary | ICD-10-CM | POA: Diagnosis not present

## 2021-07-16 DIAGNOSIS — Z992 Dependence on renal dialysis: Secondary | ICD-10-CM | POA: Diagnosis not present

## 2021-07-16 DIAGNOSIS — N186 End stage renal disease: Secondary | ICD-10-CM | POA: Diagnosis not present

## 2021-07-16 DIAGNOSIS — E782 Mixed hyperlipidemia: Secondary | ICD-10-CM | POA: Diagnosis not present

## 2021-07-16 DIAGNOSIS — C3410 Malignant neoplasm of upper lobe, unspecified bronchus or lung: Secondary | ICD-10-CM | POA: Diagnosis not present

## 2021-07-17 DIAGNOSIS — N2581 Secondary hyperparathyroidism of renal origin: Secondary | ICD-10-CM | POA: Diagnosis not present

## 2021-07-17 DIAGNOSIS — D509 Iron deficiency anemia, unspecified: Secondary | ICD-10-CM | POA: Diagnosis not present

## 2021-07-17 DIAGNOSIS — N186 End stage renal disease: Secondary | ICD-10-CM | POA: Diagnosis not present

## 2021-07-17 DIAGNOSIS — D631 Anemia in chronic kidney disease: Secondary | ICD-10-CM | POA: Diagnosis not present

## 2021-07-17 DIAGNOSIS — Z992 Dependence on renal dialysis: Secondary | ICD-10-CM | POA: Diagnosis not present

## 2021-07-19 DIAGNOSIS — N2581 Secondary hyperparathyroidism of renal origin: Secondary | ICD-10-CM | POA: Diagnosis not present

## 2021-07-19 DIAGNOSIS — Z992 Dependence on renal dialysis: Secondary | ICD-10-CM | POA: Diagnosis not present

## 2021-07-19 DIAGNOSIS — D509 Iron deficiency anemia, unspecified: Secondary | ICD-10-CM | POA: Diagnosis not present

## 2021-07-19 DIAGNOSIS — D631 Anemia in chronic kidney disease: Secondary | ICD-10-CM | POA: Diagnosis not present

## 2021-07-19 DIAGNOSIS — N186 End stage renal disease: Secondary | ICD-10-CM | POA: Diagnosis not present

## 2021-07-21 DIAGNOSIS — D631 Anemia in chronic kidney disease: Secondary | ICD-10-CM | POA: Diagnosis not present

## 2021-07-21 DIAGNOSIS — N186 End stage renal disease: Secondary | ICD-10-CM | POA: Diagnosis not present

## 2021-07-21 DIAGNOSIS — D509 Iron deficiency anemia, unspecified: Secondary | ICD-10-CM | POA: Diagnosis not present

## 2021-07-21 DIAGNOSIS — N2581 Secondary hyperparathyroidism of renal origin: Secondary | ICD-10-CM | POA: Diagnosis not present

## 2021-07-21 DIAGNOSIS — Z992 Dependence on renal dialysis: Secondary | ICD-10-CM | POA: Diagnosis not present

## 2021-07-24 DIAGNOSIS — Z992 Dependence on renal dialysis: Secondary | ICD-10-CM | POA: Diagnosis not present

## 2021-07-24 DIAGNOSIS — D631 Anemia in chronic kidney disease: Secondary | ICD-10-CM | POA: Diagnosis not present

## 2021-07-24 DIAGNOSIS — N2581 Secondary hyperparathyroidism of renal origin: Secondary | ICD-10-CM | POA: Diagnosis not present

## 2021-07-24 DIAGNOSIS — D509 Iron deficiency anemia, unspecified: Secondary | ICD-10-CM | POA: Diagnosis not present

## 2021-07-24 DIAGNOSIS — N186 End stage renal disease: Secondary | ICD-10-CM | POA: Diagnosis not present

## 2021-07-26 DIAGNOSIS — D509 Iron deficiency anemia, unspecified: Secondary | ICD-10-CM | POA: Diagnosis not present

## 2021-07-26 DIAGNOSIS — N186 End stage renal disease: Secondary | ICD-10-CM | POA: Diagnosis not present

## 2021-07-26 DIAGNOSIS — D631 Anemia in chronic kidney disease: Secondary | ICD-10-CM | POA: Diagnosis not present

## 2021-07-26 DIAGNOSIS — Z992 Dependence on renal dialysis: Secondary | ICD-10-CM | POA: Diagnosis not present

## 2021-07-26 DIAGNOSIS — N2581 Secondary hyperparathyroidism of renal origin: Secondary | ICD-10-CM | POA: Diagnosis not present

## 2021-07-28 DIAGNOSIS — D631 Anemia in chronic kidney disease: Secondary | ICD-10-CM | POA: Diagnosis not present

## 2021-07-28 DIAGNOSIS — N186 End stage renal disease: Secondary | ICD-10-CM | POA: Diagnosis not present

## 2021-07-28 DIAGNOSIS — D509 Iron deficiency anemia, unspecified: Secondary | ICD-10-CM | POA: Diagnosis not present

## 2021-07-28 DIAGNOSIS — N2581 Secondary hyperparathyroidism of renal origin: Secondary | ICD-10-CM | POA: Diagnosis not present

## 2021-07-28 DIAGNOSIS — Z992 Dependence on renal dialysis: Secondary | ICD-10-CM | POA: Diagnosis not present

## 2021-07-31 DIAGNOSIS — N186 End stage renal disease: Secondary | ICD-10-CM | POA: Diagnosis not present

## 2021-07-31 DIAGNOSIS — D631 Anemia in chronic kidney disease: Secondary | ICD-10-CM | POA: Diagnosis not present

## 2021-07-31 DIAGNOSIS — N2581 Secondary hyperparathyroidism of renal origin: Secondary | ICD-10-CM | POA: Diagnosis not present

## 2021-07-31 DIAGNOSIS — Z992 Dependence on renal dialysis: Secondary | ICD-10-CM | POA: Diagnosis not present

## 2021-07-31 DIAGNOSIS — D509 Iron deficiency anemia, unspecified: Secondary | ICD-10-CM | POA: Diagnosis not present

## 2021-08-02 DIAGNOSIS — D631 Anemia in chronic kidney disease: Secondary | ICD-10-CM | POA: Diagnosis not present

## 2021-08-02 DIAGNOSIS — Z992 Dependence on renal dialysis: Secondary | ICD-10-CM | POA: Diagnosis not present

## 2021-08-02 DIAGNOSIS — D509 Iron deficiency anemia, unspecified: Secondary | ICD-10-CM | POA: Diagnosis not present

## 2021-08-02 DIAGNOSIS — N186 End stage renal disease: Secondary | ICD-10-CM | POA: Diagnosis not present

## 2021-08-02 DIAGNOSIS — N2581 Secondary hyperparathyroidism of renal origin: Secondary | ICD-10-CM | POA: Diagnosis not present

## 2021-08-03 DIAGNOSIS — J343 Hypertrophy of nasal turbinates: Secondary | ICD-10-CM | POA: Diagnosis not present

## 2021-08-03 DIAGNOSIS — J310 Chronic rhinitis: Secondary | ICD-10-CM | POA: Diagnosis not present

## 2021-08-03 DIAGNOSIS — J342 Deviated nasal septum: Secondary | ICD-10-CM | POA: Diagnosis not present

## 2021-08-03 DIAGNOSIS — H9012 Conductive hearing loss, unilateral, left ear, with unrestricted hearing on the contralateral side: Secondary | ICD-10-CM | POA: Diagnosis not present

## 2021-08-03 DIAGNOSIS — H6522 Chronic serous otitis media, left ear: Secondary | ICD-10-CM | POA: Diagnosis not present

## 2021-08-03 DIAGNOSIS — H6982 Other specified disorders of Eustachian tube, left ear: Secondary | ICD-10-CM | POA: Diagnosis not present

## 2021-08-04 DIAGNOSIS — N2581 Secondary hyperparathyroidism of renal origin: Secondary | ICD-10-CM | POA: Diagnosis not present

## 2021-08-04 DIAGNOSIS — D631 Anemia in chronic kidney disease: Secondary | ICD-10-CM | POA: Diagnosis not present

## 2021-08-04 DIAGNOSIS — Z992 Dependence on renal dialysis: Secondary | ICD-10-CM | POA: Diagnosis not present

## 2021-08-04 DIAGNOSIS — N186 End stage renal disease: Secondary | ICD-10-CM | POA: Diagnosis not present

## 2021-08-04 DIAGNOSIS — D509 Iron deficiency anemia, unspecified: Secondary | ICD-10-CM | POA: Diagnosis not present

## 2021-08-07 DIAGNOSIS — Z992 Dependence on renal dialysis: Secondary | ICD-10-CM | POA: Diagnosis not present

## 2021-08-07 DIAGNOSIS — D631 Anemia in chronic kidney disease: Secondary | ICD-10-CM | POA: Diagnosis not present

## 2021-08-07 DIAGNOSIS — N2581 Secondary hyperparathyroidism of renal origin: Secondary | ICD-10-CM | POA: Diagnosis not present

## 2021-08-07 DIAGNOSIS — D509 Iron deficiency anemia, unspecified: Secondary | ICD-10-CM | POA: Diagnosis not present

## 2021-08-07 DIAGNOSIS — N186 End stage renal disease: Secondary | ICD-10-CM | POA: Diagnosis not present

## 2021-08-09 DIAGNOSIS — D509 Iron deficiency anemia, unspecified: Secondary | ICD-10-CM | POA: Diagnosis not present

## 2021-08-09 DIAGNOSIS — D631 Anemia in chronic kidney disease: Secondary | ICD-10-CM | POA: Diagnosis not present

## 2021-08-09 DIAGNOSIS — N186 End stage renal disease: Secondary | ICD-10-CM | POA: Diagnosis not present

## 2021-08-09 DIAGNOSIS — N2581 Secondary hyperparathyroidism of renal origin: Secondary | ICD-10-CM | POA: Diagnosis not present

## 2021-08-09 DIAGNOSIS — Z992 Dependence on renal dialysis: Secondary | ICD-10-CM | POA: Diagnosis not present

## 2021-08-11 DIAGNOSIS — N186 End stage renal disease: Secondary | ICD-10-CM | POA: Diagnosis not present

## 2021-08-11 DIAGNOSIS — D509 Iron deficiency anemia, unspecified: Secondary | ICD-10-CM | POA: Diagnosis not present

## 2021-08-11 DIAGNOSIS — D631 Anemia in chronic kidney disease: Secondary | ICD-10-CM | POA: Diagnosis not present

## 2021-08-11 DIAGNOSIS — N2581 Secondary hyperparathyroidism of renal origin: Secondary | ICD-10-CM | POA: Diagnosis not present

## 2021-08-11 DIAGNOSIS — Z992 Dependence on renal dialysis: Secondary | ICD-10-CM | POA: Diagnosis not present

## 2021-08-14 DIAGNOSIS — N2581 Secondary hyperparathyroidism of renal origin: Secondary | ICD-10-CM | POA: Diagnosis not present

## 2021-08-14 DIAGNOSIS — Z20822 Contact with and (suspected) exposure to covid-19: Secondary | ICD-10-CM | POA: Diagnosis not present

## 2021-08-14 DIAGNOSIS — D509 Iron deficiency anemia, unspecified: Secondary | ICD-10-CM | POA: Diagnosis not present

## 2021-08-14 DIAGNOSIS — D631 Anemia in chronic kidney disease: Secondary | ICD-10-CM | POA: Diagnosis not present

## 2021-08-14 DIAGNOSIS — N186 End stage renal disease: Secondary | ICD-10-CM | POA: Diagnosis not present

## 2021-08-14 DIAGNOSIS — Z992 Dependence on renal dialysis: Secondary | ICD-10-CM | POA: Diagnosis not present

## 2021-08-16 DIAGNOSIS — D631 Anemia in chronic kidney disease: Secondary | ICD-10-CM | POA: Diagnosis not present

## 2021-08-16 DIAGNOSIS — D509 Iron deficiency anemia, unspecified: Secondary | ICD-10-CM | POA: Diagnosis not present

## 2021-08-16 DIAGNOSIS — N186 End stage renal disease: Secondary | ICD-10-CM | POA: Diagnosis not present

## 2021-08-16 DIAGNOSIS — N2581 Secondary hyperparathyroidism of renal origin: Secondary | ICD-10-CM | POA: Diagnosis not present

## 2021-08-16 DIAGNOSIS — Z992 Dependence on renal dialysis: Secondary | ICD-10-CM | POA: Diagnosis not present

## 2021-08-17 DIAGNOSIS — D631 Anemia in chronic kidney disease: Secondary | ICD-10-CM | POA: Diagnosis not present

## 2021-08-17 DIAGNOSIS — N186 End stage renal disease: Secondary | ICD-10-CM | POA: Diagnosis not present

## 2021-08-17 DIAGNOSIS — N2581 Secondary hyperparathyroidism of renal origin: Secondary | ICD-10-CM | POA: Diagnosis not present

## 2021-08-17 DIAGNOSIS — Z992 Dependence on renal dialysis: Secondary | ICD-10-CM | POA: Diagnosis not present

## 2021-08-17 DIAGNOSIS — D509 Iron deficiency anemia, unspecified: Secondary | ICD-10-CM | POA: Diagnosis not present

## 2021-08-18 DIAGNOSIS — Z992 Dependence on renal dialysis: Secondary | ICD-10-CM | POA: Diagnosis not present

## 2021-08-18 DIAGNOSIS — D631 Anemia in chronic kidney disease: Secondary | ICD-10-CM | POA: Diagnosis not present

## 2021-08-18 DIAGNOSIS — N186 End stage renal disease: Secondary | ICD-10-CM | POA: Diagnosis not present

## 2021-08-21 DIAGNOSIS — Z992 Dependence on renal dialysis: Secondary | ICD-10-CM | POA: Diagnosis not present

## 2021-08-21 DIAGNOSIS — D631 Anemia in chronic kidney disease: Secondary | ICD-10-CM | POA: Diagnosis not present

## 2021-08-21 DIAGNOSIS — N186 End stage renal disease: Secondary | ICD-10-CM | POA: Diagnosis not present

## 2021-08-23 DIAGNOSIS — Z992 Dependence on renal dialysis: Secondary | ICD-10-CM | POA: Diagnosis not present

## 2021-08-23 DIAGNOSIS — D509 Iron deficiency anemia, unspecified: Secondary | ICD-10-CM | POA: Diagnosis not present

## 2021-08-23 DIAGNOSIS — N186 End stage renal disease: Secondary | ICD-10-CM | POA: Diagnosis not present

## 2021-08-23 DIAGNOSIS — D631 Anemia in chronic kidney disease: Secondary | ICD-10-CM | POA: Diagnosis not present

## 2021-08-23 DIAGNOSIS — N2581 Secondary hyperparathyroidism of renal origin: Secondary | ICD-10-CM | POA: Diagnosis not present

## 2021-08-23 NOTE — Progress Notes (Signed)
Referring Provider: Jake Samples, PA* Primary Care Physician:  Jake Samples, PA-C Primary GI: Dr. Gala Romney   Chief Complaint  Patient presents with   Constipation    Having discomfort in back. Taking more bentyl    Gastroesophageal Reflux    HPI:   Patricia [name] is a 50 y.o. female presenting today with a history of constipation that has been difficult to manage, previously trying Trulance, Amitiza, and Linzess. Linzess has worked best and now patient assistance has been obtained. Chronic GERD. Miralax doesn't work well for patient. On dialysis. On fluid restriction  Is on oxygen now. Seeing Pulmonology. Inpatient July 2022 with acute respiratory failure felt secondary to a viral infection. Untreated OSA and obesity contributing to symptoms. Had run out of Linzess and got constipated. Had gotten so bad her lower back was hurting. Will cramp when at dialysis and take Bentyl sparingly. Protonix once daily working well for her.     Past Medical History:  Diagnosis Date   Anemia    Ankle fracture    Arthritis    Blood transfusion without reported diagnosis    Breast cancer ([place])    Cancer ([place])    Diabetes mellitus without complication ([place])    Dialysis patient ([place])    mon, wed, friday,    End stage renal disease on dialysis ([place])    M/W/F [facility] in [place]   GERD (gastroesophageal reflux disease)    Hypertension    OSA (obstructive sleep apnea)    uses CPAP sometimes   Pneumonia    PONV (postoperative nausea and vomiting)    Wears glasses     Past Surgical History:  Procedure Laterality Date   ABDOMINAL HYSTERECTOMY     AV FISTULA PLACEMENT  11/2014   at [facility] N/A 04/2021   BALLOON DILATION N/A 07/10/2016   Procedure: BALLOON DILATION;  Surgeon: Danie Binder, MD;  [facility];  Service: Endoscopy;  Laterality: N/A;  Pyloric dilation   BREAST LUMPECTOMY     CESAREAN SECTION     CHOLECYSTECTOMY      COLONOSCOPY WITH PROPOFOL N/A 09/27/2016   Dr. Gala Romney: Internal hemorrhoids repeat colonoscopy in 10 years   DILATION AND CURETTAGE OF UTERUS     ESOPHAGOGASTRODUODENOSCOPY N/A 07/10/2016   Dr.Fields- normal esophagus, gastric stenosis was found at the pylorus, gastritis on bx, normal examined duodenun   EXTERNAL FIXATION REMOVAL Right 10/29/2018   Procedure: REMOVAL RIGHT ANKLE BIOMET ZIMMER EXTERNAL FIXATOR, SHORT LEG CAST APPLICATION;  Surgeon: Marybelle Killings, MD;  Location: [facility];  Service: Orthopedics;  Laterality: Right;   MASTECTOMY     left sided   ORIF ANKLE FRACTURE Right 10/06/2018   Procedure: OPEN REDUCTION INTERNAL FIXATION (ORIF) RIGHT ANKLE TRIMALLEOLAR;  Surgeon: Marybelle Killings, MD;  Location: [facility];  Service: Orthopedics;  Laterality: Right;    Current Outpatient Medications  Medication Sig Dispense Refill   acetaminophen (TYLENOL) 325 MG tablet Take 650 mg by mouth every 6 (six) hours as needed.     albuterol (PROVENTIL HFA;VENTOLIN HFA) 108 (90 Base) MCG/ACT inhaler Inhale 2 puffs into the lungs every 6 (six) hours as needed for wheezing or shortness of breath. 1 Inhaler 2   albuterol (PROVENTIL) (2.5 MG/3ML) 0.083% nebulizer solution Take 3 mLs (2.5 mg total) by nebulization every 6 (six) hours as needed for wheezing or shortness of breath. 75 mL 12   bisoprolol (ZEBETA) 5 MG tablet Take  1 tablet (5 mg total) by mouth 2 (two) times daily. 60 tablet 2   Budeson-Glycopyrrol-Formoterol (BREZTRI AEROSPHERE) 160-9-4.8 MCG/ACT AERO Inhale 2 puffs into the lungs in the morning and at bedtime. 10.7 g 6   Budeson-Glycopyrrol-Formoterol (BREZTRI AEROSPHERE) 160-9-4.8 MCG/ACT AERO Inhale 2 puffs into the lungs in the morning and at bedtime. 10.7 g 0   cinacalcet (SENSIPAR) 60 MG tablet Take 30 mg by mouth daily with breakfast.     dicyclomine (BENTYL) 10 MG capsule Take 10 mg by mouth 2 (two) times daily as needed.     ferric citrate (AURYXIA) 1 GM 210 MG(Fe) tablet Take 2 tablets by  mouth 2 (two) times daily with a meal. With Breakfast & with supper     fluticasone (FLONASE) 50 MCG/ACT nasal spray Place 2 sprays into both nostrils 2 (two) times daily.     glipiZIDE (GLUCOTROL) 5 MG tablet Take 10 mg by mouth 2 (two) times daily as needed. For blood sugar levels over 150     ipratropium (ATROVENT) 0.03 % nasal spray Place 2 sprays into both nostrils 2 (two) times daily.      linaclotide (LINZESS) 290 MCG CAPS capsule Take 290 mcg by mouth daily before breakfast.     montelukast (SINGULAIR) 10 MG tablet Take 1 tablet (10 mg total) by mouth at bedtime. 30 tablet 11   multivitamin (RENA-VIT) TABS tablet Take 1 tablet by mouth daily.     ondansetron (ZOFRAN-ODT) 4 MG disintegrating tablet Take 4 mg by mouth as needed for nausea or vomiting.      pantoprazole (PROTONIX) 40 MG tablet Take 1 tablet (40 mg total) by mouth daily. 30 minutes before breakfast 90 tablet 3   rOPINIRole (REQUIP XL) 2 MG 24 hr tablet Take 2 mg by mouth at bedtime.      sevelamer carbonate (RENVELA) 800 MG tablet Take 800 mg by mouth See admin instructions. Take 2 tablets (1600 mg) in the morning, take 3 tablets (2400 mg) with lunch or snack,  and take 2 tablets (1600 mg) by mouth in the evening with dinner meal.     sodium chloride (OCEAN) 0.65 % SOLN nasal spray Place 1 spray into both nostrils as needed for congestion. 60 mL 0   FLOVENT HFA 110 MCG/ACT inhaler 1 puff 2 (two) times daily. (Patient not taking: Reported on 08/24/2021)     No current facility-administered medications for this visit.    Allergies as of 08/24/2021 - Review Complete 08/24/2021  Allergen Reaction Noted   Amlodipine besylate Rash and Other (See Comments) 12/04/2015   Reglan [metoclopramide] Other (See Comments) 12/04/2015    Family History  Problem Relation Age of Onset   Diabetes Mellitus II Mother    Hypertension Mother    Hypertension Sister    Hypertension Sister    Colon cancer Neg Hx     Social History    Socioeconomic History   Marital status: Widowed    Spouse name: Not on file   Number of children: Not on file   Years of education: Not on file   Highest education level: Not on file  Occupational History   Not on file  Tobacco Use   Smoking status: Never   Smokeless tobacco: Never  Vaping Use   Vaping Use: Never used  Substance and Sexual Activity   Alcohol use: No    Alcohol/week: 0.0 standard drinks   Drug use: No   Sexual activity: Not on file  Other Topics Concern   Not  on file  Social History Narrative   Not on file   Social Determinants of Health   Financial Resource Strain: Not on file  Food Insecurity: Not on file  Transportation Needs: Not on file  Physical Activity: Not on file  Stress: Not on file  Social Connections: Not on file    Review of Systems: Gen: Denies fever, chills, anorexia. Denies fatigue, weakness, weight loss.  CV: Denies chest pain, palpitations, syncope, peripheral edema, and claudication. Resp: Denies dyspnea at rest, cough, wheezing, coughing up blood, and pleurisy. GI: see HPI Derm: Denies rash, itching, dry skin Psych: Denies depression, anxiety, memory loss, confusion. No homicidal or suicidal ideation.  Heme: Denies bruising, bleeding, and enlarged lymph nodes.  Physical Exam: BP (!) 186/84   Pulse 72   Temp (!) 96.9 F (36.1 C) (Temporal)   Ht 5\' 3"  (1.6 m)   Wt 245 lb 9.6 oz (111.4 kg)   BMI 43.51 kg/m  General:   Alert and oriented. No distress noted. Pleasant and cooperative. On supplemental oxygen via nasal cannula Head:  Normocephalic and atraumatic. Eyes:  Conjuctiva clear without scleral icterus. Mouth:  mask in place Abdomen:  +BS, soft, obese, on-tender and non-distended. No rebound or guarding. No HSM or masses noted. Msk:  Symmetrical without gross deformities. Normal posture. Extremities:  Without edema. Neurologic:  Alert and  oriented x4 Psych:  Alert and cooperative. Normal mood and  affect.  ASSESSMENT: Patricia [name] is a 50 y.o. female presenting today presenting today with a history of constipation that has been difficult to manage, chronic GERD, ESRD on dialysis. Presenting in follow-up.  Constipation: recently running out of Linzess with worsening constipation. She has now resumed this. Still not ideal results. Difficult scenario due to polypharmacy contributing, fluid restriction on dialysis. We will trial Trulance again. I have given her samples and asked her to call with updates. She has dicyclomine on hand for dialysis days due to abdominal cramping during dialysis. I again reviewed using this sparingly as it can contribute to constipation.  GERD well-managed with Protonix. No alarm signs/symptoms.   PLAN:  Trial of Trulance Continue Protonix 6-8 month follow-up   Annitta Needs, PhD, ANP-BC [facility]

## 2021-08-24 ENCOUNTER — Encounter: Payer: Self-pay | Admitting: Gastroenterology

## 2021-08-24 ENCOUNTER — Other Ambulatory Visit: Payer: Self-pay

## 2021-08-24 ENCOUNTER — Ambulatory Visit (INDEPENDENT_AMBULATORY_CARE_PROVIDER_SITE_OTHER): Payer: Medicare Other | Admitting: Gastroenterology

## 2021-08-24 VITALS — BP 186/84 | HR 72 | Temp 96.9°F | Ht 63.0 in | Wt 245.6 lb

## 2021-08-24 DIAGNOSIS — K219 Gastro-esophageal reflux disease without esophagitis: Secondary | ICD-10-CM | POA: Diagnosis not present

## 2021-08-24 DIAGNOSIS — K5900 Constipation, unspecified: Secondary | ICD-10-CM | POA: Diagnosis not present

## 2021-08-24 NOTE — Patient Instructions (Signed)
Let's try Trulance one tablet daily. Let me know how this works for you!  We will see you back in follow-up in around 6 months.  I hope you start feeling better!  I enjoyed seeing you again today! As you know, I value our relationship and want to provide genuine, compassionate, and quality care. I welcome your feedback. If you receive a survey regarding your visit,  I greatly appreciate you taking time to fill this out. See you next time!  Annitta Needs, PhD, ANP-BC [facility]

## 2021-08-25 DIAGNOSIS — N186 End stage renal disease: Secondary | ICD-10-CM | POA: Diagnosis not present

## 2021-08-25 DIAGNOSIS — Z992 Dependence on renal dialysis: Secondary | ICD-10-CM | POA: Diagnosis not present

## 2021-08-25 DIAGNOSIS — D631 Anemia in chronic kidney disease: Secondary | ICD-10-CM | POA: Diagnosis not present

## 2021-08-25 DIAGNOSIS — N2581 Secondary hyperparathyroidism of renal origin: Secondary | ICD-10-CM | POA: Diagnosis not present

## 2021-08-25 DIAGNOSIS — D509 Iron deficiency anemia, unspecified: Secondary | ICD-10-CM | POA: Diagnosis not present

## 2021-08-28 DIAGNOSIS — Z992 Dependence on renal dialysis: Secondary | ICD-10-CM | POA: Diagnosis not present

## 2021-08-28 DIAGNOSIS — D631 Anemia in chronic kidney disease: Secondary | ICD-10-CM | POA: Diagnosis not present

## 2021-08-28 DIAGNOSIS — N186 End stage renal disease: Secondary | ICD-10-CM | POA: Diagnosis not present

## 2021-08-28 DIAGNOSIS — N2581 Secondary hyperparathyroidism of renal origin: Secondary | ICD-10-CM | POA: Diagnosis not present

## 2021-08-28 DIAGNOSIS — D509 Iron deficiency anemia, unspecified: Secondary | ICD-10-CM | POA: Diagnosis not present

## 2021-08-29 ENCOUNTER — Ambulatory Visit (INDEPENDENT_AMBULATORY_CARE_PROVIDER_SITE_OTHER): Payer: Medicare Other | Admitting: Pulmonary Disease

## 2021-08-29 ENCOUNTER — Encounter: Payer: Self-pay | Admitting: Pulmonary Disease

## 2021-08-29 ENCOUNTER — Ambulatory Visit (HOSPITAL_COMMUNITY)
Admission: RE | Admit: 2021-08-29 | Discharge: 2021-08-29 | Disposition: A | Discharge status: Home / Self Care | Payer: Medicare Other | Source: Ambulatory Visit | Attending: Pulmonary Disease | Admitting: Pulmonary Disease

## 2021-08-29 ENCOUNTER — Other Ambulatory Visit: Payer: Self-pay

## 2021-08-29 VITALS — BP 158/96 | HR 78 | Temp 98.4°F | Ht 63.0 in | Wt 248.0 lb

## 2021-08-29 DIAGNOSIS — R059 Cough, unspecified: Secondary | ICD-10-CM

## 2021-08-29 DIAGNOSIS — G4733 Obstructive sleep apnea (adult) (pediatric): Secondary | ICD-10-CM | POA: Diagnosis not present

## 2021-08-29 MED ORDER — LABETALOL HCL 100 MG PO TABS: 100.0000 mg | ORAL_TABLET | Freq: Two times a day (BID) | ORAL | 5 refills | Status: DC

## 2021-08-29 MED ORDER — NIFEDIPINE ER OSMOTIC RELEASE 60 MG PO TB24: 60.0000 mg | ORAL_TABLET | Freq: Every day | ORAL | 5 refills | Status: DC

## 2021-08-29 NOTE — Patient Instructions (Signed)
Chest xray today  Will arrange for pulmonary function test and home sleep study  Start nifedipine 60 mg daily and labetalol 100 mg twice per day, and then stop bisoprolol  Follow up in 2 months

## 2021-08-29 NOTE — Progress Notes (Signed)
[facility]  Chief Complaint  Patient presents with   Consult    Referred to Dr. Halford Chessman for sleep apnea. Has had HST approx. 6 years ago. Has a cpap at home but doesn't use it often. Using 2LO2 after recent hosp. Visit.     Constitutional:  BP (!) 158/96 (BP Location: Left Arm, Patient Position: Sitting)   Pulse 78   Temp 98.4 F (36.9 C) (Oral)   Ht 5\' 3"  (1.6 m)   Wt 248 lb (112.5 kg)   SpO2 95% Comment: 2 L  BMI 43.93 kg/m   Past Medical History:  Anemia, OA, Breast cancer, DM, ESRD on iHD, GERD, HTN, COVID pneumonia January 2021  Past Surgical History:  She  has a past surgical history that includes Cholecystectomy; Cesarean section; Dilation and curettage of uterus; Abdominal hysterectomy; AV fistula placement (11/2014); Esophagogastroduodenoscopy (N/A, 07/10/2016); Balloon dilation (N/A, 07/10/2016); Colonoscopy with propofol (N/A, 09/27/2016); Mastectomy; Breast lumpectomy; ORIF ankle fracture (Right, 10/06/2018); External fixation removal (Right, 10/29/2018); and AV fistula repair (N/A, 04/2021).  Brief Summary:  Patricia [name] is a 50 y.o. female with dyspnea and obstructive sleep apnea.      Subjective:   She was in hospital in July for dyspnea and sent home with supplemental oxygen.  She uses this intermittently.  She has CPAP, but has used this consistently.  She has been using breztri and feels this helps.  She was told by her kidney doctor that pulmonary has to manage her blood pressure since her blood pressure medicine was changed in the hospital by Dr. Melvyn Novas.  She has intermittent cough.  No fever or hemoptysis.  Denies chest pain.  Snores, and has trouble with her breathing when asleep.  Feels sleepy during the day.  Physical Exam:   Appearance - well kempt   ENMT - no sinus tenderness, no oral exudate, no LAN, Mallampati 4 airway, no stridor  Respiratory - equal breath sounds bilaterally, no wheezing or rales  CV -  s1s2 regular rate and rhythm, no murmurs  Ext - no clubbing, no edema  Skin - no rashes  Psych - normal mood and affect   Pulmonary testing:  PFT 01/09/18 >> FEV1 1.60 (70%), FEV1% 89, TLC 3.26 (66%), DLCO 55%  Chest Imaging:  CT angio chest 06/26/21 >> mosaic attenuation of the airspaces, bandlike scarring at bases  Sleep Tests:    Cardiac Tests:  Echo 06/27/21 >> EF 55 to 60%, mild LVH, grade 2 DD, RVSP 29.6 mmHg, mild MR  Social History:  She  reports that she has never smoked. She has never used smokeless tobacco. She reports that she does not drink alcohol and does not use drugs.  Family History:  Her family history includes Diabetes Mellitus II in her mother; Hypertension in her mother, sister, and sister.     Assessment/Plan:   Dyspnea. - repeat chest xray today; if abnormalities persist, then will need high resolution CT chest to further assess - continue breztri, singulair - schedule PFT to be done at [facility] - continue 2 liters oxygen 24/7 for now  Obstructive sleep apnea. - will arrange for home sleep study to assess current status of sleep apnea and then discuss treatment options  Hypertension. - she reports this was better controlled with nifedipine and labetalol; resumed these and will stop bisoprolol - asked her to check with PCP or nephrology about having one of them resume managing prescriptions for her antihypertensive management  Obesity. - discussed importance  of weight loss  ESRD on iHD. - followed by nephrology  She has asked to have pulmonary management consolidated in [place] office.  Time Spent Involved in Patient Care on Day of Examination:  35 minutes  Follow up:   Patient Instructions  Chest xray today  Will arrange for pulmonary function test and home sleep study  Start nifedipine 60 mg daily and labetalol 100 mg twice per day, and then stop bisoprolol  Follow up in 2 months  Medication List:   Allergies as of 08/29/2021        Reactions   Amlodipine Besylate Rash, Other (See Comments)   dizziness   Reglan [metoclopramide] Other (See Comments)   hallucinations        Medication List        Accurate as of August 29, 2021  3:40 PM. If you have any questions, ask your nurse or doctor.          STOP taking these medications    bisoprolol 5 MG tablet Commonly known as: ZEBETA Stopped by: Chesley Mires, MD       TAKE these medications    acetaminophen 325 MG tablet Commonly known as: TYLENOL Take 650 mg by mouth every 6 (six) hours as needed.   albuterol (2.5 MG/3ML) 0.083% nebulizer solution Commonly known as: PROVENTIL Take 3 mLs (2.5 mg total) by nebulization every 6 (six) hours as needed for wheezing or shortness of breath. What changed: Another medication with the same name was removed. Continue taking this medication, and follow the directions you see here. Changed by: Chesley Mires, MD   Arnell Sieving [phone] MCG/ACT Aero Generic drug: Budeson-Glycopyrrol-Formoterol Inhale 2 puffs into the lungs in the morning and at bedtime. What changed: Another medication with the same name was removed. Continue taking this medication, and follow the directions you see here. Changed by: Chesley Mires, MD   cinacalcet 60 MG tablet Commonly known as: SENSIPAR Take 30 mg by mouth daily with breakfast.   dicyclomine 10 MG capsule Commonly known as: BENTYL Take 10 mg by mouth 2 (two) times daily as needed.   ferric citrate 1 GM 210 MG(Fe) tablet Commonly known as: AURYXIA Take 2 tablets by mouth 2 (two) times daily with a meal. With Breakfast & with supper   fluticasone 50 MCG/ACT nasal spray Commonly known as: FLONASE Place 2 sprays into both nostrils 2 (two) times daily.   glipiZIDE 5 MG tablet Commonly known as: GLUCOTROL Take 10 mg by mouth 2 (two) times daily as needed. For blood sugar levels over 150   ipratropium 0.03 % nasal spray Commonly known as: ATROVENT Place 2 sprays  into both nostrils 2 (two) times daily.   labetalol 100 MG tablet Commonly known as: NORMODYNE Take 1 tablet (100 mg total) by mouth 2 (two) times daily. Started by: Chesley Mires, MD   linaclotide 290 MCG Caps capsule Commonly known as: LINZESS Take 290 mcg by mouth daily before breakfast.   montelukast 10 MG tablet Commonly known as: SINGULAIR Take 1 tablet (10 mg total) by mouth at bedtime.   multivitamin Tabs tablet Take 1 tablet by mouth daily.   NIFEdipine 60 MG 24 hr tablet Commonly known as: PROCARDIA XL/NIFEDICAL XL Take 1 tablet (60 mg total) by mouth daily. Started by: Chesley Mires, MD   ondansetron 4 MG disintegrating tablet Commonly known as: ZOFRAN-ODT Take 4 mg by mouth as needed for nausea or vomiting.   pantoprazole 40 MG tablet Commonly known as: PROTONIX Take 1 tablet (  40 mg total) by mouth daily. 30 minutes before breakfast   rOPINIRole 2 MG 24 hr tablet Commonly known as: REQUIP XL Take 2 mg by mouth at bedtime.   sevelamer carbonate 800 MG tablet Commonly known as: RENVELA Take 800 mg by mouth See admin instructions. Take 2 tablets (1600 mg) in the morning, take 3 tablets (2400 mg) with lunch or snack,  and take 2 tablets (1600 mg) by mouth in the evening with dinner meal.   sodium chloride 0.65 % Soln nasal spray Commonly known as: OCEAN Place 1 spray into both nostrils as needed for congestion.        Signature:  Chesley Mires, MD [place] Pager - [phone] 08/29/2021, 3:40 PM

## 2021-08-30 DIAGNOSIS — N186 End stage renal disease: Secondary | ICD-10-CM | POA: Diagnosis not present

## 2021-08-30 DIAGNOSIS — D509 Iron deficiency anemia, unspecified: Secondary | ICD-10-CM | POA: Diagnosis not present

## 2021-08-30 DIAGNOSIS — Z992 Dependence on renal dialysis: Secondary | ICD-10-CM | POA: Diagnosis not present

## 2021-08-30 DIAGNOSIS — N2581 Secondary hyperparathyroidism of renal origin: Secondary | ICD-10-CM | POA: Diagnosis not present

## 2021-08-30 DIAGNOSIS — D631 Anemia in chronic kidney disease: Secondary | ICD-10-CM | POA: Diagnosis not present

## 2021-09-01 ENCOUNTER — Other Ambulatory Visit: Payer: Self-pay

## 2021-09-01 DIAGNOSIS — D509 Iron deficiency anemia, unspecified: Secondary | ICD-10-CM | POA: Diagnosis not present

## 2021-09-01 DIAGNOSIS — Z992 Dependence on renal dialysis: Secondary | ICD-10-CM | POA: Diagnosis not present

## 2021-09-01 DIAGNOSIS — D631 Anemia in chronic kidney disease: Secondary | ICD-10-CM | POA: Diagnosis not present

## 2021-09-01 DIAGNOSIS — N186 End stage renal disease: Secondary | ICD-10-CM | POA: Diagnosis not present

## 2021-09-01 DIAGNOSIS — N2581 Secondary hyperparathyroidism of renal origin: Secondary | ICD-10-CM | POA: Diagnosis not present

## 2021-09-01 DIAGNOSIS — J849 Interstitial pulmonary disease, unspecified: Secondary | ICD-10-CM

## 2021-09-04 DIAGNOSIS — N2581 Secondary hyperparathyroidism of renal origin: Secondary | ICD-10-CM | POA: Diagnosis not present

## 2021-09-04 DIAGNOSIS — N186 End stage renal disease: Secondary | ICD-10-CM | POA: Diagnosis not present

## 2021-09-04 DIAGNOSIS — D631 Anemia in chronic kidney disease: Secondary | ICD-10-CM | POA: Diagnosis not present

## 2021-09-04 DIAGNOSIS — D509 Iron deficiency anemia, unspecified: Secondary | ICD-10-CM | POA: Diagnosis not present

## 2021-09-04 DIAGNOSIS — Z992 Dependence on renal dialysis: Secondary | ICD-10-CM | POA: Diagnosis not present

## 2021-09-06 DIAGNOSIS — Z992 Dependence on renal dialysis: Secondary | ICD-10-CM | POA: Diagnosis not present

## 2021-09-06 DIAGNOSIS — N186 End stage renal disease: Secondary | ICD-10-CM | POA: Diagnosis not present

## 2021-09-06 DIAGNOSIS — D509 Iron deficiency anemia, unspecified: Secondary | ICD-10-CM | POA: Diagnosis not present

## 2021-09-06 DIAGNOSIS — D631 Anemia in chronic kidney disease: Secondary | ICD-10-CM | POA: Diagnosis not present

## 2021-09-06 DIAGNOSIS — N2581 Secondary hyperparathyroidism of renal origin: Secondary | ICD-10-CM | POA: Diagnosis not present

## 2021-09-08 DIAGNOSIS — N186 End stage renal disease: Secondary | ICD-10-CM | POA: Diagnosis not present

## 2021-09-08 DIAGNOSIS — D631 Anemia in chronic kidney disease: Secondary | ICD-10-CM | POA: Diagnosis not present

## 2021-09-08 DIAGNOSIS — D509 Iron deficiency anemia, unspecified: Secondary | ICD-10-CM | POA: Diagnosis not present

## 2021-09-08 DIAGNOSIS — Z992 Dependence on renal dialysis: Secondary | ICD-10-CM | POA: Diagnosis not present

## 2021-09-08 DIAGNOSIS — N2581 Secondary hyperparathyroidism of renal origin: Secondary | ICD-10-CM | POA: Diagnosis not present

## 2021-09-11 DIAGNOSIS — Z992 Dependence on renal dialysis: Secondary | ICD-10-CM | POA: Diagnosis not present

## 2021-09-11 DIAGNOSIS — D631 Anemia in chronic kidney disease: Secondary | ICD-10-CM | POA: Diagnosis not present

## 2021-09-11 DIAGNOSIS — N186 End stage renal disease: Secondary | ICD-10-CM | POA: Diagnosis not present

## 2021-09-11 DIAGNOSIS — N2581 Secondary hyperparathyroidism of renal origin: Secondary | ICD-10-CM | POA: Diagnosis not present

## 2021-09-11 DIAGNOSIS — D509 Iron deficiency anemia, unspecified: Secondary | ICD-10-CM | POA: Diagnosis not present

## 2021-09-12 ENCOUNTER — Ambulatory Visit: Payer: Medicare Other | Admitting: Pulmonary Disease

## 2021-09-13 DIAGNOSIS — N186 End stage renal disease: Secondary | ICD-10-CM | POA: Diagnosis not present

## 2021-09-13 DIAGNOSIS — D631 Anemia in chronic kidney disease: Secondary | ICD-10-CM | POA: Diagnosis not present

## 2021-09-13 DIAGNOSIS — D509 Iron deficiency anemia, unspecified: Secondary | ICD-10-CM | POA: Diagnosis not present

## 2021-09-13 DIAGNOSIS — N2581 Secondary hyperparathyroidism of renal origin: Secondary | ICD-10-CM | POA: Diagnosis not present

## 2021-09-13 DIAGNOSIS — Z992 Dependence on renal dialysis: Secondary | ICD-10-CM | POA: Diagnosis not present

## 2021-09-15 DIAGNOSIS — Z992 Dependence on renal dialysis: Secondary | ICD-10-CM | POA: Diagnosis not present

## 2021-09-15 DIAGNOSIS — D631 Anemia in chronic kidney disease: Secondary | ICD-10-CM | POA: Diagnosis not present

## 2021-09-15 DIAGNOSIS — N186 End stage renal disease: Secondary | ICD-10-CM | POA: Diagnosis not present

## 2021-09-15 DIAGNOSIS — D509 Iron deficiency anemia, unspecified: Secondary | ICD-10-CM | POA: Diagnosis not present

## 2021-09-15 DIAGNOSIS — N2581 Secondary hyperparathyroidism of renal origin: Secondary | ICD-10-CM | POA: Diagnosis not present

## 2021-09-16 DIAGNOSIS — N186 End stage renal disease: Secondary | ICD-10-CM | POA: Diagnosis not present

## 2021-09-16 DIAGNOSIS — Z992 Dependence on renal dialysis: Secondary | ICD-10-CM | POA: Diagnosis not present

## 2021-09-16 DIAGNOSIS — N2581 Secondary hyperparathyroidism of renal origin: Secondary | ICD-10-CM | POA: Diagnosis not present

## 2021-09-16 DIAGNOSIS — D631 Anemia in chronic kidney disease: Secondary | ICD-10-CM | POA: Diagnosis not present

## 2021-09-16 DIAGNOSIS — D509 Iron deficiency anemia, unspecified: Secondary | ICD-10-CM | POA: Diagnosis not present

## 2021-09-18 DIAGNOSIS — N2581 Secondary hyperparathyroidism of renal origin: Secondary | ICD-10-CM | POA: Diagnosis not present

## 2021-09-18 DIAGNOSIS — Z992 Dependence on renal dialysis: Secondary | ICD-10-CM | POA: Diagnosis not present

## 2021-09-18 DIAGNOSIS — N186 End stage renal disease: Secondary | ICD-10-CM | POA: Diagnosis not present

## 2021-09-18 DIAGNOSIS — D509 Iron deficiency anemia, unspecified: Secondary | ICD-10-CM | POA: Diagnosis not present

## 2021-09-18 DIAGNOSIS — D631 Anemia in chronic kidney disease: Secondary | ICD-10-CM | POA: Diagnosis not present

## 2021-09-20 DIAGNOSIS — N186 End stage renal disease: Secondary | ICD-10-CM | POA: Diagnosis not present

## 2021-09-20 DIAGNOSIS — Z992 Dependence on renal dialysis: Secondary | ICD-10-CM | POA: Diagnosis not present

## 2021-09-20 DIAGNOSIS — D509 Iron deficiency anemia, unspecified: Secondary | ICD-10-CM | POA: Diagnosis not present

## 2021-09-20 DIAGNOSIS — N2581 Secondary hyperparathyroidism of renal origin: Secondary | ICD-10-CM | POA: Diagnosis not present

## 2021-09-20 DIAGNOSIS — D631 Anemia in chronic kidney disease: Secondary | ICD-10-CM | POA: Diagnosis not present

## 2021-09-22 DIAGNOSIS — D509 Iron deficiency anemia, unspecified: Secondary | ICD-10-CM | POA: Diagnosis not present

## 2021-09-22 DIAGNOSIS — Z992 Dependence on renal dialysis: Secondary | ICD-10-CM | POA: Diagnosis not present

## 2021-09-22 DIAGNOSIS — N2581 Secondary hyperparathyroidism of renal origin: Secondary | ICD-10-CM | POA: Diagnosis not present

## 2021-09-22 DIAGNOSIS — N186 End stage renal disease: Secondary | ICD-10-CM | POA: Diagnosis not present

## 2021-09-22 DIAGNOSIS — D631 Anemia in chronic kidney disease: Secondary | ICD-10-CM | POA: Diagnosis not present

## 2021-09-24 DIAGNOSIS — D631 Anemia in chronic kidney disease: Secondary | ICD-10-CM | POA: Diagnosis not present

## 2021-09-24 DIAGNOSIS — N186 End stage renal disease: Secondary | ICD-10-CM | POA: Diagnosis not present

## 2021-09-24 DIAGNOSIS — Z992 Dependence on renal dialysis: Secondary | ICD-10-CM | POA: Diagnosis not present

## 2021-09-24 DIAGNOSIS — D509 Iron deficiency anemia, unspecified: Secondary | ICD-10-CM | POA: Diagnosis not present

## 2021-09-24 DIAGNOSIS — N2581 Secondary hyperparathyroidism of renal origin: Secondary | ICD-10-CM | POA: Diagnosis not present

## 2021-09-25 DIAGNOSIS — Z992 Dependence on renal dialysis: Secondary | ICD-10-CM | POA: Diagnosis not present

## 2021-09-25 DIAGNOSIS — D631 Anemia in chronic kidney disease: Secondary | ICD-10-CM | POA: Diagnosis not present

## 2021-09-25 DIAGNOSIS — N2581 Secondary hyperparathyroidism of renal origin: Secondary | ICD-10-CM | POA: Diagnosis not present

## 2021-09-25 DIAGNOSIS — N186 End stage renal disease: Secondary | ICD-10-CM | POA: Diagnosis not present

## 2021-09-25 DIAGNOSIS — D509 Iron deficiency anemia, unspecified: Secondary | ICD-10-CM | POA: Diagnosis not present

## 2021-09-27 DIAGNOSIS — D631 Anemia in chronic kidney disease: Secondary | ICD-10-CM | POA: Diagnosis not present

## 2021-09-27 DIAGNOSIS — D509 Iron deficiency anemia, unspecified: Secondary | ICD-10-CM | POA: Diagnosis not present

## 2021-09-27 DIAGNOSIS — N186 End stage renal disease: Secondary | ICD-10-CM | POA: Diagnosis not present

## 2021-09-27 DIAGNOSIS — Z992 Dependence on renal dialysis: Secondary | ICD-10-CM | POA: Diagnosis not present

## 2021-09-27 DIAGNOSIS — N2581 Secondary hyperparathyroidism of renal origin: Secondary | ICD-10-CM | POA: Diagnosis not present

## 2021-09-29 ENCOUNTER — Ambulatory Visit (HOSPITAL_COMMUNITY): Payer: Medicare Other

## 2021-09-29 DIAGNOSIS — D509 Iron deficiency anemia, unspecified: Secondary | ICD-10-CM | POA: Diagnosis not present

## 2021-09-29 DIAGNOSIS — D631 Anemia in chronic kidney disease: Secondary | ICD-10-CM | POA: Diagnosis not present

## 2021-09-29 DIAGNOSIS — N186 End stage renal disease: Secondary | ICD-10-CM | POA: Diagnosis not present

## 2021-09-29 DIAGNOSIS — N2581 Secondary hyperparathyroidism of renal origin: Secondary | ICD-10-CM | POA: Diagnosis not present

## 2021-09-29 DIAGNOSIS — Z992 Dependence on renal dialysis: Secondary | ICD-10-CM | POA: Diagnosis not present

## 2021-10-02 DIAGNOSIS — N2581 Secondary hyperparathyroidism of renal origin: Secondary | ICD-10-CM | POA: Diagnosis not present

## 2021-10-02 DIAGNOSIS — Z992 Dependence on renal dialysis: Secondary | ICD-10-CM | POA: Diagnosis not present

## 2021-10-02 DIAGNOSIS — N186 End stage renal disease: Secondary | ICD-10-CM | POA: Diagnosis not present

## 2021-10-02 DIAGNOSIS — D509 Iron deficiency anemia, unspecified: Secondary | ICD-10-CM | POA: Diagnosis not present

## 2021-10-02 DIAGNOSIS — D631 Anemia in chronic kidney disease: Secondary | ICD-10-CM | POA: Diagnosis not present

## 2021-10-04 DIAGNOSIS — D631 Anemia in chronic kidney disease: Secondary | ICD-10-CM | POA: Diagnosis not present

## 2021-10-04 DIAGNOSIS — N186 End stage renal disease: Secondary | ICD-10-CM | POA: Diagnosis not present

## 2021-10-04 DIAGNOSIS — D509 Iron deficiency anemia, unspecified: Secondary | ICD-10-CM | POA: Diagnosis not present

## 2021-10-04 DIAGNOSIS — Z992 Dependence on renal dialysis: Secondary | ICD-10-CM | POA: Diagnosis not present

## 2021-10-04 DIAGNOSIS — N2581 Secondary hyperparathyroidism of renal origin: Secondary | ICD-10-CM | POA: Diagnosis not present

## 2021-10-05 DIAGNOSIS — Z23 Encounter for immunization: Secondary | ICD-10-CM | POA: Diagnosis not present

## 2021-10-06 DIAGNOSIS — D631 Anemia in chronic kidney disease: Secondary | ICD-10-CM | POA: Diagnosis not present

## 2021-10-06 DIAGNOSIS — N2581 Secondary hyperparathyroidism of renal origin: Secondary | ICD-10-CM | POA: Diagnosis not present

## 2021-10-06 DIAGNOSIS — D509 Iron deficiency anemia, unspecified: Secondary | ICD-10-CM | POA: Diagnosis not present

## 2021-10-06 DIAGNOSIS — N186 End stage renal disease: Secondary | ICD-10-CM | POA: Diagnosis not present

## 2021-10-06 DIAGNOSIS — Z992 Dependence on renal dialysis: Secondary | ICD-10-CM | POA: Diagnosis not present

## 2021-10-09 DIAGNOSIS — D509 Iron deficiency anemia, unspecified: Secondary | ICD-10-CM | POA: Diagnosis not present

## 2021-10-09 DIAGNOSIS — Z992 Dependence on renal dialysis: Secondary | ICD-10-CM | POA: Diagnosis not present

## 2021-10-09 DIAGNOSIS — N2581 Secondary hyperparathyroidism of renal origin: Secondary | ICD-10-CM | POA: Diagnosis not present

## 2021-10-09 DIAGNOSIS — D631 Anemia in chronic kidney disease: Secondary | ICD-10-CM | POA: Diagnosis not present

## 2021-10-09 DIAGNOSIS — N186 End stage renal disease: Secondary | ICD-10-CM | POA: Diagnosis not present

## 2021-10-10 ENCOUNTER — Other Ambulatory Visit: Payer: Self-pay

## 2021-10-10 ENCOUNTER — Ambulatory Visit (HOSPITAL_COMMUNITY)
Admission: RE | Admit: 2021-10-10 | Discharge: 2021-10-10 | Disposition: A | Discharge status: Home / Self Care | Payer: Medicare Other | Source: Ambulatory Visit | Attending: Pulmonary Disease | Admitting: Pulmonary Disease

## 2021-10-10 DIAGNOSIS — I700 Atherosclerosis of aorta: Secondary | ICD-10-CM | POA: Diagnosis not present

## 2021-10-10 DIAGNOSIS — J849 Interstitial pulmonary disease, unspecified: Secondary | ICD-10-CM | POA: Insufficient documentation

## 2021-10-11 DIAGNOSIS — D631 Anemia in chronic kidney disease: Secondary | ICD-10-CM | POA: Diagnosis not present

## 2021-10-11 DIAGNOSIS — D509 Iron deficiency anemia, unspecified: Secondary | ICD-10-CM | POA: Diagnosis not present

## 2021-10-11 DIAGNOSIS — Z992 Dependence on renal dialysis: Secondary | ICD-10-CM | POA: Diagnosis not present

## 2021-10-11 DIAGNOSIS — N2581 Secondary hyperparathyroidism of renal origin: Secondary | ICD-10-CM | POA: Diagnosis not present

## 2021-10-11 DIAGNOSIS — N186 End stage renal disease: Secondary | ICD-10-CM | POA: Diagnosis not present

## 2021-10-13 ENCOUNTER — Other Ambulatory Visit (HOSPITAL_COMMUNITY)
Admission: RE | Admit: 2021-10-13 | Discharge: 2021-10-13 | Disposition: A | Discharge status: Home / Self Care | Payer: Medicare Other | Source: Ambulatory Visit | Attending: Pulmonary Disease | Admitting: Pulmonary Disease

## 2021-10-13 DIAGNOSIS — N186 End stage renal disease: Secondary | ICD-10-CM | POA: Diagnosis not present

## 2021-10-13 DIAGNOSIS — Z992 Dependence on renal dialysis: Secondary | ICD-10-CM | POA: Diagnosis not present

## 2021-10-13 DIAGNOSIS — Z20822 Contact with and (suspected) exposure to covid-19: Secondary | ICD-10-CM | POA: Diagnosis not present

## 2021-10-13 DIAGNOSIS — N2581 Secondary hyperparathyroidism of renal origin: Secondary | ICD-10-CM | POA: Diagnosis not present

## 2021-10-13 DIAGNOSIS — Z01812 Encounter for preprocedural laboratory examination: Secondary | ICD-10-CM | POA: Insufficient documentation

## 2021-10-13 DIAGNOSIS — D509 Iron deficiency anemia, unspecified: Secondary | ICD-10-CM | POA: Diagnosis not present

## 2021-10-13 DIAGNOSIS — Z01818 Encounter for other preprocedural examination: Secondary | ICD-10-CM

## 2021-10-13 DIAGNOSIS — D631 Anemia in chronic kidney disease: Secondary | ICD-10-CM | POA: Diagnosis not present

## 2021-10-14 LAB — SARS CORONAVIRUS 2 (TAT 6-24 HRS): SARS Coronavirus 2: NEGATIVE

## 2021-10-16 DIAGNOSIS — D509 Iron deficiency anemia, unspecified: Secondary | ICD-10-CM | POA: Diagnosis not present

## 2021-10-16 DIAGNOSIS — D631 Anemia in chronic kidney disease: Secondary | ICD-10-CM | POA: Diagnosis not present

## 2021-10-16 DIAGNOSIS — Z992 Dependence on renal dialysis: Secondary | ICD-10-CM | POA: Diagnosis not present

## 2021-10-16 DIAGNOSIS — N2581 Secondary hyperparathyroidism of renal origin: Secondary | ICD-10-CM | POA: Diagnosis not present

## 2021-10-16 DIAGNOSIS — N186 End stage renal disease: Secondary | ICD-10-CM | POA: Diagnosis not present

## 2021-10-17 ENCOUNTER — Other Ambulatory Visit: Payer: Self-pay

## 2021-10-17 ENCOUNTER — Ambulatory Visit (HOSPITAL_COMMUNITY)
Admission: RE | Admit: 2021-10-17 | Discharge: 2021-10-17 | Disposition: A | Discharge status: Home / Self Care | Payer: Medicare Other | Source: Ambulatory Visit | Attending: Pulmonary Disease | Admitting: Pulmonary Disease

## 2021-10-17 DIAGNOSIS — R0602 Shortness of breath: Secondary | ICD-10-CM | POA: Insufficient documentation

## 2021-10-17 DIAGNOSIS — Z9981 Dependence on supplemental oxygen: Secondary | ICD-10-CM | POA: Diagnosis not present

## 2021-10-17 DIAGNOSIS — D631 Anemia in chronic kidney disease: Secondary | ICD-10-CM | POA: Diagnosis not present

## 2021-10-17 DIAGNOSIS — Z79899 Other long term (current) drug therapy: Secondary | ICD-10-CM | POA: Diagnosis not present

## 2021-10-17 DIAGNOSIS — D509 Iron deficiency anemia, unspecified: Secondary | ICD-10-CM | POA: Diagnosis not present

## 2021-10-17 DIAGNOSIS — Z992 Dependence on renal dialysis: Secondary | ICD-10-CM | POA: Diagnosis not present

## 2021-10-17 DIAGNOSIS — N186 End stage renal disease: Secondary | ICD-10-CM | POA: Diagnosis not present

## 2021-10-17 DIAGNOSIS — R059 Cough, unspecified: Secondary | ICD-10-CM | POA: Diagnosis not present

## 2021-10-17 DIAGNOSIS — N2581 Secondary hyperparathyroidism of renal origin: Secondary | ICD-10-CM | POA: Diagnosis not present

## 2021-10-17 LAB — PULMONARY FUNCTION TEST
DL/VA % pred: 57 %
DL/VA: 2.49 ml/min/mmHg/L
DLCO cor % pred: 30 %
DLCO cor: 6.13 ml/min/mmHg
DLCO unc % pred: 27 %
DLCO unc: 5.5 ml/min/mmHg
FEF 25-75 Post: 1.61 L/sec
FEF 25-75 Pre: 1.75 L/sec
FEF2575-%Change-Post: -7 %
FEF2575-%Pred-Post: 68 %
FEF2575-%Pred-Pre: 74 %
FEV1-%Change-Post: -1 %
FEV1-%Pred-Post: 56 %
FEV1-%Pred-Pre: 57 %
FEV1-Post: 1.26 L
FEV1-Pre: 1.28 L
FEV1FVC-%Change-Post: 1 %
FEV1FVC-%Pred-Pre: 109 %
FEV6-%Change-Post: -2 %
FEV6-%Pred-Post: 52 %
FEV6-%Pred-Pre: 53 %
FEV6-Post: 1.4 L
FEV6-Pre: 1.43 L
FEV6FVC-%Change-Post: 1 %
FEV6FVC-%Pred-Post: 102 %
FEV6FVC-%Pred-Pre: 101 %
FVC-%Change-Post: -3 %
FVC-%Pred-Post: 50 %
FVC-%Pred-Pre: 52 %
FVC-Post: 1.4 L
FVC-Pre: 1.45 L
Post FEV1/FVC ratio: 90 %
Post FEV6/FVC ratio: 100 %
Pre FEV1/FVC ratio: 89 %
Pre FEV6/FVC Ratio: 99 %
RV % pred: 101 %
RV: 1.77 L
TLC % pred: 66 %
TLC: 3.28 L

## 2021-10-17 MED ORDER — ALBUTEROL SULFATE (2.5 MG/3ML) 0.083% IN NEBU
2.5000 mg | INHALATION_SOLUTION | Freq: Once | RESPIRATORY_TRACT | Status: AC
  Administered 2021-10-17: 2.5 mg via RESPIRATORY_TRACT

## 2021-10-18 DIAGNOSIS — D509 Iron deficiency anemia, unspecified: Secondary | ICD-10-CM | POA: Diagnosis not present

## 2021-10-18 DIAGNOSIS — Z992 Dependence on renal dialysis: Secondary | ICD-10-CM | POA: Diagnosis not present

## 2021-10-18 DIAGNOSIS — N2581 Secondary hyperparathyroidism of renal origin: Secondary | ICD-10-CM | POA: Diagnosis not present

## 2021-10-18 DIAGNOSIS — N186 End stage renal disease: Secondary | ICD-10-CM | POA: Diagnosis not present

## 2021-10-18 DIAGNOSIS — D631 Anemia in chronic kidney disease: Secondary | ICD-10-CM | POA: Diagnosis not present

## 2021-10-20 DIAGNOSIS — D509 Iron deficiency anemia, unspecified: Secondary | ICD-10-CM | POA: Diagnosis not present

## 2021-10-20 DIAGNOSIS — D631 Anemia in chronic kidney disease: Secondary | ICD-10-CM | POA: Diagnosis not present

## 2021-10-20 DIAGNOSIS — Z992 Dependence on renal dialysis: Secondary | ICD-10-CM | POA: Diagnosis not present

## 2021-10-20 DIAGNOSIS — N186 End stage renal disease: Secondary | ICD-10-CM | POA: Diagnosis not present

## 2021-10-20 DIAGNOSIS — N2581 Secondary hyperparathyroidism of renal origin: Secondary | ICD-10-CM | POA: Diagnosis not present

## 2021-10-23 DIAGNOSIS — D631 Anemia in chronic kidney disease: Secondary | ICD-10-CM | POA: Diagnosis not present

## 2021-10-23 DIAGNOSIS — Z992 Dependence on renal dialysis: Secondary | ICD-10-CM | POA: Diagnosis not present

## 2021-10-23 DIAGNOSIS — N2581 Secondary hyperparathyroidism of renal origin: Secondary | ICD-10-CM | POA: Diagnosis not present

## 2021-10-23 DIAGNOSIS — N186 End stage renal disease: Secondary | ICD-10-CM | POA: Diagnosis not present

## 2021-10-23 DIAGNOSIS — D509 Iron deficiency anemia, unspecified: Secondary | ICD-10-CM | POA: Diagnosis not present

## 2021-10-24 DIAGNOSIS — N2581 Secondary hyperparathyroidism of renal origin: Secondary | ICD-10-CM | POA: Diagnosis not present

## 2021-10-24 DIAGNOSIS — D509 Iron deficiency anemia, unspecified: Secondary | ICD-10-CM | POA: Diagnosis not present

## 2021-10-24 DIAGNOSIS — D631 Anemia in chronic kidney disease: Secondary | ICD-10-CM | POA: Diagnosis not present

## 2021-10-24 DIAGNOSIS — Z992 Dependence on renal dialysis: Secondary | ICD-10-CM | POA: Diagnosis not present

## 2021-10-24 DIAGNOSIS — N186 End stage renal disease: Secondary | ICD-10-CM | POA: Diagnosis not present

## 2021-10-25 DIAGNOSIS — Z992 Dependence on renal dialysis: Secondary | ICD-10-CM | POA: Diagnosis not present

## 2021-10-25 DIAGNOSIS — N186 End stage renal disease: Secondary | ICD-10-CM | POA: Diagnosis not present

## 2021-10-25 DIAGNOSIS — D509 Iron deficiency anemia, unspecified: Secondary | ICD-10-CM | POA: Diagnosis not present

## 2021-10-25 DIAGNOSIS — D631 Anemia in chronic kidney disease: Secondary | ICD-10-CM | POA: Diagnosis not present

## 2021-10-25 DIAGNOSIS — N2581 Secondary hyperparathyroidism of renal origin: Secondary | ICD-10-CM | POA: Diagnosis not present

## 2021-10-27 DIAGNOSIS — Z992 Dependence on renal dialysis: Secondary | ICD-10-CM | POA: Diagnosis not present

## 2021-10-27 DIAGNOSIS — D631 Anemia in chronic kidney disease: Secondary | ICD-10-CM | POA: Diagnosis not present

## 2021-10-27 DIAGNOSIS — D509 Iron deficiency anemia, unspecified: Secondary | ICD-10-CM | POA: Diagnosis not present

## 2021-10-27 DIAGNOSIS — N186 End stage renal disease: Secondary | ICD-10-CM | POA: Diagnosis not present

## 2021-10-27 DIAGNOSIS — N2581 Secondary hyperparathyroidism of renal origin: Secondary | ICD-10-CM | POA: Diagnosis not present

## 2021-10-30 DIAGNOSIS — Z992 Dependence on renal dialysis: Secondary | ICD-10-CM | POA: Diagnosis not present

## 2021-10-30 DIAGNOSIS — D631 Anemia in chronic kidney disease: Secondary | ICD-10-CM | POA: Diagnosis not present

## 2021-10-30 DIAGNOSIS — D509 Iron deficiency anemia, unspecified: Secondary | ICD-10-CM | POA: Diagnosis not present

## 2021-10-30 DIAGNOSIS — N2581 Secondary hyperparathyroidism of renal origin: Secondary | ICD-10-CM | POA: Diagnosis not present

## 2021-10-30 DIAGNOSIS — N186 End stage renal disease: Secondary | ICD-10-CM | POA: Diagnosis not present

## 2021-10-31 ENCOUNTER — Encounter: Payer: Self-pay | Admitting: Pulmonary Disease

## 2021-10-31 ENCOUNTER — Ambulatory Visit (INDEPENDENT_AMBULATORY_CARE_PROVIDER_SITE_OTHER): Payer: Medicare Other | Admitting: Pulmonary Disease

## 2021-10-31 ENCOUNTER — Other Ambulatory Visit: Payer: Self-pay

## 2021-10-31 VITALS — BP 142/84 | HR 78 | Temp 98.4°F | Ht 63.0 in | Wt 235.8 lb

## 2021-10-31 DIAGNOSIS — J42 Unspecified chronic bronchitis: Secondary | ICD-10-CM

## 2021-10-31 NOTE — Patient Instructions (Signed)
Can stop using breztri  Lab tests today  Will check on status of home sleep study  Follow up in 3 months

## 2021-10-31 NOTE — Progress Notes (Signed)
Pat[name], Critical Care, and Sleep Medicine  Chief Complaint  Patient presents with   Follow-up    Patient wants to talk about a different inhaler besides breztri. Cold weather is hard on her lungs. 2LO2 all the time.     Constitutional:  BP (!) 142/84   Pulse 78   Temp 98.4 F (36.9 C)   Ht _0  (1.6 m)   Wt 235 lb 12.8 oz (107 kg)   SpO2 96% Comment: 2L O2  BMI 41.77 kg/m   Past Medical History:  Anemia, OA, Breast cancer, DM, ESRD on iHD, GERD, HTN, COVID pneumonia January 2021  Past Surgical History:  She  has a past surgical history that includes Cholecystectomy; Cesarean section; Dilation and curettage of uterus; Abdominal hysterectomy; AV fistula placement (11/2014); Esophagogastroduodenoscopy (N/A, 07/10/2016); Balloon dilation (N/A, 07/10/2016); Colonoscopy with propofol (N/A, 09/27/2016); Mastectomy; Breast lumpectomy; ORIF ankle fracture (Right, 10/06/2018); External fixation removal (Right, 10/29/2018); and AV fistula repair (N/A, 04/2021).  Brief Summary:  Patricia [name] is a 50 y.o. 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  She had viral respiratory infection in May 2022 and developed symptoms after this.      Subjective:   HRCT chest from October showed persistent changes of air trapping and mosaic attenuation.  PFT showed progression of diffusion defect.  She continues to use 2 liters oxygen 24/7.  She has intermittent cough with phlegm.  She feels that breztri makes her feel worse.  Hasn't been using albuterol.  She feels singulair helps some.  No skin rash, fever, joint swelling, or hemoptysis.   Physical Exam:   Appearance - well kempt, wearing oxygen   ENMT - no sinus tenderness, no oral exudate, no LAN, Mallampati 4 airway, no stridor  Respiratory - equal breath sounds bilaterally, no wheezing or rales  CV - s1s2 regular rate and rhythm, no murmurs  Ext - no  clubbing, no edema  Skin - no rashes  Psych - normal mood and affect    [name] testing:  PFT 01/09/18 >> FEV1 1.60 (70%), FEV1% 89, TLC 3.26 (66%), DLCO 55% PFT 10/17/21 >> FEV1 1.28 (57%), FEV1% 89, TLC 3.28 (66%), DLCO 27%  Chest Imaging:  CT angio chest 06/26/21 >> mosaic attenuation of the airspaces, bandlike scarring at bases HRCT chest 10/11/21 >> mild, mosaic attenuation of airspaces, mild air trapping, scarring at bases  Sleep Tests:    Cardiac Tests:  Echo 06/27/21 >> EF 55 to 60%, mild LVH, grade 2 DD, RVSP 29.6 mmHg, mild MR  Social History:  She  reports that she has never smoked. She has never used smokeless tobacco. She reports that she does not drink alcohol and does not use drugs.  Family History:  Her family history includes Diabetes Mellitus II in her mother; Hypertension in her mother, sister, and sister.     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 - intolerant of breztri; will discontinue this - continue singulair - prn albuterol - will check ESR, ANA, RF, ANCA  Chronic respiratory failure with hypoxia. - continue 2 liters 24/7  Obstructive sleep apnea. - will check on status of home sleep study  Obesity. - discussed importance of weight loss  ESRD on iHD. - followed by nephrology  Time Spent Involved in Patient Care on Day of Examination:  32 minutes  Follow up:   Patient Instructions  Can stop using breztri  Lab tests today  Will check  on status of home sleep study  Follow up in 3 months  Medication List:   Allergies as of 10/31/2021       Reactions   Amlodipine Besylate Rash, Other (See Comments)   dizziness   Reglan [metoclopramide] Other (See Comments)   hallucinations        Medication List        Accurate as of October 31, 2021 10:20 AM. If you have any questions, ask your nurse or doctor.          STOP taking these medications    Breztri Aerosphere  160-9-4.8 MCG/ACT Aero Generic drug: Budeson-Glycopyrrol-Formoterol Stopped by: Patricia Mires, MD       TAKE these medications    acetaminophen 325 MG tablet Commonly known as: TYLENOL Take 650 mg by mouth every 6 (six) hours as needed.   albuterol (2.5 MG/3ML) 0.083% nebulizer solution Commonly known as: PROVENTIL Take 3 mLs (2.5 mg total) by nebulization every 6 (six) hours as needed for wheezing or shortness of breath.   cinacalcet 60 MG tablet Commonly known as: SENSIPAR Take 30 mg by mouth daily with breakfast.   dicyclomine 10 MG capsule Commonly known as: BENTYL Take 10 mg by mouth 2 (two) times daily as needed.   ferric citrate 1 GM 210 MG(Fe) tablet Commonly known as: AURYXIA Take 2 tablets by mouth 2 (two) times daily with a meal. With Breakfast & with supper   fluticasone 50 MCG/ACT nasal spray Commonly known as: FLONASE Place 2 sprays into both nostrils 2 (two) times daily.   glipiZIDE 5 MG tablet Commonly known as: GLUCOTROL Take 10 mg by mouth 2 (two) times daily as needed. For blood sugar levels over 150   ipratropium 0.03 % nasal spray Commonly known as: ATROVENT Place 2 sprays into both nostrils 2 (two) times daily.   labetalol 100 MG tablet Commonly known as: NORMODYNE Take 1 tablet (100 mg total) by mouth 2 (two) times daily.   linaclotide 290 MCG Caps capsule Commonly known as: LINZESS Take 290 mcg by mouth daily before breakfast.   montelukast 10 MG tablet Commonly known as: SINGULAIR Take 1 tablet (10 mg total) by mouth at bedtime.   multivitamin Tabs tablet Take 1 tablet by mouth daily.   NIFEdipine 60 MG 24 hr tablet Commonly known as: PROCARDIA XL/NIFEDICAL XL Take 1 tablet (60 mg total) by mouth daily.   ondansetron 4 MG disintegrating tablet Commonly known as: ZOFRAN-ODT Take 4 mg by mouth as needed for nausea or vomiting.   pantoprazole 40 MG tablet Commonly known as: PROTONIX Take 1 tablet (40 mg total) by mouth daily. 30  minutes before breakfast   rOPINIRole 2 MG 24 hr tablet Commonly known as: REQUIP XL Take 2 mg by mouth at bedtime.   sevelamer carbonate 800 MG tablet Commonly known as: RENVELA Take 800 mg by mouth See admin instructions. Take 2 tablets (1600 mg) in the morning, take 3 tablets (2400 mg) with lunch or snack,  and take 2 tablets (1600 mg) by mouth in the evening with dinner meal.   sodium chloride 0.65 % Soln nasal spray Commonly known as: OCEAN Place 1 spray into both nostrils as needed for congestion.        Signature:  Patricia Mires, MD [place] Pager - [phone] 10/31/2021, 10:20 AM

## 2021-10-31 NOTE — Addendum Note (Signed)
Addended by: Chesley Mires on: 10/31/2021 10:29 AM   Modules accepted: Orders

## 2021-11-01 DIAGNOSIS — N186 End stage renal disease: Secondary | ICD-10-CM | POA: Diagnosis not present

## 2021-11-01 DIAGNOSIS — N2581 Secondary hyperparathyroidism of renal origin: Secondary | ICD-10-CM | POA: Diagnosis not present

## 2021-11-01 DIAGNOSIS — Z992 Dependence on renal dialysis: Secondary | ICD-10-CM | POA: Diagnosis not present

## 2021-11-01 DIAGNOSIS — D631 Anemia in chronic kidney disease: Secondary | ICD-10-CM | POA: Diagnosis not present

## 2021-11-01 DIAGNOSIS — D509 Iron deficiency anemia, unspecified: Secondary | ICD-10-CM | POA: Diagnosis not present

## 2021-11-01 LAB — SEDIMENTATION RATE: Sed Rate: 11 mm/hr (ref 0–40)

## 2021-11-01 LAB — RHEUMATOID FACTOR: Rheumatoid fact SerPl-aCnc: <10 IU/mL (ref <14.0)

## 2021-11-02 LAB — ANCA PROFILE
Anti-MPO Antibodies: <0.2 units (ref 0.0–0.9)
Anti-PR3 Antibodies: <0.2 units (ref 0.0–0.9)
Atypical pANCA: <1:20 {titer}
C-ANCA: <1:20 {titer}
P-ANCA: <1:20 {titer}

## 2021-11-03 DIAGNOSIS — D509 Iron deficiency anemia, unspecified: Secondary | ICD-10-CM | POA: Diagnosis not present

## 2021-11-03 DIAGNOSIS — D631 Anemia in chronic kidney disease: Secondary | ICD-10-CM | POA: Diagnosis not present

## 2021-11-03 DIAGNOSIS — N2581 Secondary hyperparathyroidism of renal origin: Secondary | ICD-10-CM | POA: Diagnosis not present

## 2021-11-03 DIAGNOSIS — N186 End stage renal disease: Secondary | ICD-10-CM | POA: Diagnosis not present

## 2021-11-03 DIAGNOSIS — Z992 Dependence on renal dialysis: Secondary | ICD-10-CM | POA: Diagnosis not present

## 2021-11-06 DIAGNOSIS — D509 Iron deficiency anemia, unspecified: Secondary | ICD-10-CM | POA: Diagnosis not present

## 2021-11-06 DIAGNOSIS — D631 Anemia in chronic kidney disease: Secondary | ICD-10-CM | POA: Diagnosis not present

## 2021-11-06 DIAGNOSIS — N186 End stage renal disease: Secondary | ICD-10-CM | POA: Diagnosis not present

## 2021-11-06 DIAGNOSIS — N2581 Secondary hyperparathyroidism of renal origin: Secondary | ICD-10-CM | POA: Diagnosis not present

## 2021-11-06 DIAGNOSIS — Z992 Dependence on renal dialysis: Secondary | ICD-10-CM | POA: Diagnosis not present

## 2021-11-08 DIAGNOSIS — N186 End stage renal disease: Secondary | ICD-10-CM | POA: Diagnosis not present

## 2021-11-08 DIAGNOSIS — D509 Iron deficiency anemia, unspecified: Secondary | ICD-10-CM | POA: Diagnosis not present

## 2021-11-08 DIAGNOSIS — Z992 Dependence on renal dialysis: Secondary | ICD-10-CM | POA: Diagnosis not present

## 2021-11-08 DIAGNOSIS — D631 Anemia in chronic kidney disease: Secondary | ICD-10-CM | POA: Diagnosis not present

## 2021-11-08 DIAGNOSIS — N2581 Secondary hyperparathyroidism of renal origin: Secondary | ICD-10-CM | POA: Diagnosis not present

## 2021-11-10 DIAGNOSIS — Z992 Dependence on renal dialysis: Secondary | ICD-10-CM | POA: Diagnosis not present

## 2021-11-10 DIAGNOSIS — D509 Iron deficiency anemia, unspecified: Secondary | ICD-10-CM | POA: Diagnosis not present

## 2021-11-10 DIAGNOSIS — N2581 Secondary hyperparathyroidism of renal origin: Secondary | ICD-10-CM | POA: Diagnosis not present

## 2021-11-10 DIAGNOSIS — D631 Anemia in chronic kidney disease: Secondary | ICD-10-CM | POA: Diagnosis not present

## 2021-11-10 DIAGNOSIS — N186 End stage renal disease: Secondary | ICD-10-CM | POA: Diagnosis not present

## 2021-11-13 DIAGNOSIS — D631 Anemia in chronic kidney disease: Secondary | ICD-10-CM | POA: Diagnosis not present

## 2021-11-13 DIAGNOSIS — D509 Iron deficiency anemia, unspecified: Secondary | ICD-10-CM | POA: Diagnosis not present

## 2021-11-13 DIAGNOSIS — N2581 Secondary hyperparathyroidism of renal origin: Secondary | ICD-10-CM | POA: Diagnosis not present

## 2021-11-13 DIAGNOSIS — N186 End stage renal disease: Secondary | ICD-10-CM | POA: Diagnosis not present

## 2021-11-13 DIAGNOSIS — Z992 Dependence on renal dialysis: Secondary | ICD-10-CM | POA: Diagnosis not present

## 2021-11-15 DIAGNOSIS — N2581 Secondary hyperparathyroidism of renal origin: Secondary | ICD-10-CM | POA: Diagnosis not present

## 2021-11-15 DIAGNOSIS — N186 End stage renal disease: Secondary | ICD-10-CM | POA: Diagnosis not present

## 2021-11-15 DIAGNOSIS — Z992 Dependence on renal dialysis: Secondary | ICD-10-CM | POA: Diagnosis not present

## 2021-11-15 DIAGNOSIS — D631 Anemia in chronic kidney disease: Secondary | ICD-10-CM | POA: Diagnosis not present

## 2021-11-15 DIAGNOSIS — D509 Iron deficiency anemia, unspecified: Secondary | ICD-10-CM | POA: Diagnosis not present

## 2021-11-16 DIAGNOSIS — Z992 Dependence on renal dialysis: Secondary | ICD-10-CM | POA: Diagnosis not present

## 2021-11-16 DIAGNOSIS — N2581 Secondary hyperparathyroidism of renal origin: Secondary | ICD-10-CM | POA: Diagnosis not present

## 2021-11-16 DIAGNOSIS — D509 Iron deficiency anemia, unspecified: Secondary | ICD-10-CM | POA: Diagnosis not present

## 2021-11-16 DIAGNOSIS — N186 End stage renal disease: Secondary | ICD-10-CM | POA: Diagnosis not present

## 2021-11-16 DIAGNOSIS — D631 Anemia in chronic kidney disease: Secondary | ICD-10-CM | POA: Diagnosis not present

## 2021-11-17 DIAGNOSIS — D631 Anemia in chronic kidney disease: Secondary | ICD-10-CM | POA: Diagnosis not present

## 2021-11-17 DIAGNOSIS — D509 Iron deficiency anemia, unspecified: Secondary | ICD-10-CM | POA: Diagnosis not present

## 2021-11-17 DIAGNOSIS — Z992 Dependence on renal dialysis: Secondary | ICD-10-CM | POA: Diagnosis not present

## 2021-11-17 DIAGNOSIS — N2581 Secondary hyperparathyroidism of renal origin: Secondary | ICD-10-CM | POA: Diagnosis not present

## 2021-11-17 DIAGNOSIS — N186 End stage renal disease: Secondary | ICD-10-CM | POA: Diagnosis not present

## 2021-11-20 DIAGNOSIS — D631 Anemia in chronic kidney disease: Secondary | ICD-10-CM | POA: Diagnosis not present

## 2021-11-20 DIAGNOSIS — D509 Iron deficiency anemia, unspecified: Secondary | ICD-10-CM | POA: Diagnosis not present

## 2021-11-20 DIAGNOSIS — N186 End stage renal disease: Secondary | ICD-10-CM | POA: Diagnosis not present

## 2021-11-20 DIAGNOSIS — N2581 Secondary hyperparathyroidism of renal origin: Secondary | ICD-10-CM | POA: Diagnosis not present

## 2021-11-20 DIAGNOSIS — Z992 Dependence on renal dialysis: Secondary | ICD-10-CM | POA: Diagnosis not present

## 2021-11-22 DIAGNOSIS — N186 End stage renal disease: Secondary | ICD-10-CM | POA: Diagnosis not present

## 2021-11-22 DIAGNOSIS — N2581 Secondary hyperparathyroidism of renal origin: Secondary | ICD-10-CM | POA: Diagnosis not present

## 2021-11-22 DIAGNOSIS — D631 Anemia in chronic kidney disease: Secondary | ICD-10-CM | POA: Diagnosis not present

## 2021-11-22 DIAGNOSIS — Z992 Dependence on renal dialysis: Secondary | ICD-10-CM | POA: Diagnosis not present

## 2021-11-22 DIAGNOSIS — D509 Iron deficiency anemia, unspecified: Secondary | ICD-10-CM | POA: Diagnosis not present

## 2021-11-23 DIAGNOSIS — D631 Anemia in chronic kidney disease: Secondary | ICD-10-CM | POA: Diagnosis not present

## 2021-11-23 DIAGNOSIS — N186 End stage renal disease: Secondary | ICD-10-CM | POA: Diagnosis not present

## 2021-11-23 DIAGNOSIS — N2581 Secondary hyperparathyroidism of renal origin: Secondary | ICD-10-CM | POA: Diagnosis not present

## 2021-11-23 DIAGNOSIS — D509 Iron deficiency anemia, unspecified: Secondary | ICD-10-CM | POA: Diagnosis not present

## 2021-11-23 DIAGNOSIS — Z992 Dependence on renal dialysis: Secondary | ICD-10-CM | POA: Diagnosis not present

## 2021-11-24 DIAGNOSIS — D509 Iron deficiency anemia, unspecified: Secondary | ICD-10-CM | POA: Diagnosis not present

## 2021-11-24 DIAGNOSIS — N2581 Secondary hyperparathyroidism of renal origin: Secondary | ICD-10-CM | POA: Diagnosis not present

## 2021-11-24 DIAGNOSIS — N186 End stage renal disease: Secondary | ICD-10-CM | POA: Diagnosis not present

## 2021-11-24 DIAGNOSIS — Z992 Dependence on renal dialysis: Secondary | ICD-10-CM | POA: Diagnosis not present

## 2021-11-24 DIAGNOSIS — D631 Anemia in chronic kidney disease: Secondary | ICD-10-CM | POA: Diagnosis not present

## 2021-11-25 DIAGNOSIS — R0602 Shortness of breath: Secondary | ICD-10-CM | POA: Diagnosis not present

## 2021-11-25 DIAGNOSIS — D631 Anemia in chronic kidney disease: Secondary | ICD-10-CM | POA: Diagnosis not present

## 2021-11-25 DIAGNOSIS — E8729 Other acidosis: Secondary | ICD-10-CM | POA: Diagnosis not present

## 2021-11-25 DIAGNOSIS — E1122 Type 2 diabetes mellitus with diabetic chronic kidney disease: Secondary | ICD-10-CM | POA: Diagnosis not present

## 2021-11-25 DIAGNOSIS — R071 Chest pain on breathing: Secondary | ICD-10-CM | POA: Diagnosis not present

## 2021-11-25 DIAGNOSIS — K219 Gastro-esophageal reflux disease without esophagitis: Secondary | ICD-10-CM | POA: Diagnosis not present

## 2021-11-25 DIAGNOSIS — Z6841 Body Mass Index (BMI) 40.0 and over, adult: Secondary | ICD-10-CM | POA: Diagnosis not present

## 2021-11-25 DIAGNOSIS — J9621 Acute and chronic respiratory failure with hypoxia: Secondary | ICD-10-CM | POA: Diagnosis not present

## 2021-11-25 DIAGNOSIS — J9601 Acute respiratory failure with hypoxia: Secondary | ICD-10-CM | POA: Diagnosis not present

## 2021-11-25 DIAGNOSIS — Z9981 Dependence on supplemental oxygen: Secondary | ICD-10-CM | POA: Diagnosis not present

## 2021-11-25 DIAGNOSIS — J189 Pneumonia, unspecified organism: Secondary | ICD-10-CM | POA: Diagnosis not present

## 2021-11-25 DIAGNOSIS — N2581 Secondary hyperparathyroidism of renal origin: Secondary | ICD-10-CM | POA: Diagnosis not present

## 2021-11-25 DIAGNOSIS — K5909 Other constipation: Secondary | ICD-10-CM | POA: Diagnosis not present

## 2021-11-25 DIAGNOSIS — R4182 Altered mental status, unspecified: Secondary | ICD-10-CM | POA: Diagnosis not present

## 2021-11-25 DIAGNOSIS — I161 Hypertensive emergency: Secondary | ICD-10-CM | POA: Diagnosis not present

## 2021-11-25 DIAGNOSIS — J810 Acute pulmonary edema: Secondary | ICD-10-CM | POA: Diagnosis not present

## 2021-11-25 DIAGNOSIS — Z20822 Contact with and (suspected) exposure to covid-19: Secondary | ICD-10-CM | POA: Diagnosis not present

## 2021-11-25 DIAGNOSIS — I120 Hypertensive chronic kidney disease with stage 5 chronic kidney disease or end stage renal disease: Secondary | ICD-10-CM | POA: Diagnosis not present

## 2021-11-25 DIAGNOSIS — Z7984 Long term (current) use of oral hypoglycemic drugs: Secondary | ICD-10-CM | POA: Diagnosis not present

## 2021-11-25 DIAGNOSIS — G4733 Obstructive sleep apnea (adult) (pediatric): Secondary | ICD-10-CM | POA: Diagnosis not present

## 2021-11-25 DIAGNOSIS — J440 Chronic obstructive pulmonary disease with acute lower respiratory infection: Secondary | ICD-10-CM | POA: Diagnosis not present

## 2021-11-25 DIAGNOSIS — N186 End stage renal disease: Secondary | ICD-10-CM | POA: Diagnosis not present

## 2021-11-25 DIAGNOSIS — E8770 Fluid overload, unspecified: Secondary | ICD-10-CM | POA: Diagnosis not present

## 2021-11-25 DIAGNOSIS — E042 Nontoxic multinodular goiter: Secondary | ICD-10-CM | POA: Diagnosis not present

## 2021-11-25 DIAGNOSIS — E875 Hyperkalemia: Secondary | ICD-10-CM | POA: Diagnosis not present

## 2021-11-25 DIAGNOSIS — Z992 Dependence on renal dialysis: Secondary | ICD-10-CM | POA: Diagnosis not present

## 2021-11-25 DIAGNOSIS — R918 Other nonspecific abnormal finding of lung field: Secondary | ICD-10-CM | POA: Diagnosis not present

## 2021-11-25 DIAGNOSIS — E872 Acidosis, unspecified: Secondary | ICD-10-CM | POA: Diagnosis not present

## 2021-11-25 DIAGNOSIS — R059 Cough, unspecified: Secondary | ICD-10-CM | POA: Diagnosis not present

## 2021-11-25 DIAGNOSIS — J9602 Acute respiratory failure with hypercapnia: Secondary | ICD-10-CM | POA: Diagnosis not present

## 2021-11-29 DIAGNOSIS — N2581 Secondary hyperparathyroidism of renal origin: Secondary | ICD-10-CM | POA: Diagnosis not present

## 2021-11-29 DIAGNOSIS — D509 Iron deficiency anemia, unspecified: Secondary | ICD-10-CM | POA: Diagnosis not present

## 2021-11-29 DIAGNOSIS — D631 Anemia in chronic kidney disease: Secondary | ICD-10-CM | POA: Diagnosis not present

## 2021-11-29 DIAGNOSIS — R000 Tachycardia, unspecified: Secondary | ICD-10-CM | POA: Diagnosis not present

## 2021-11-29 DIAGNOSIS — N186 End stage renal disease: Secondary | ICD-10-CM | POA: Diagnosis not present

## 2021-11-29 DIAGNOSIS — Z992 Dependence on renal dialysis: Secondary | ICD-10-CM | POA: Diagnosis not present

## 2021-11-30 ENCOUNTER — Telehealth: Payer: Self-pay | Admitting: Gastroenterology

## 2021-11-30 DIAGNOSIS — Z6841 Body Mass Index (BMI) 40.0 and over, adult: Secondary | ICD-10-CM | POA: Diagnosis not present

## 2021-11-30 DIAGNOSIS — Z992 Dependence on renal dialysis: Secondary | ICD-10-CM | POA: Diagnosis not present

## 2021-11-30 DIAGNOSIS — Z1331 Encounter for screening for depression: Secondary | ICD-10-CM | POA: Diagnosis not present

## 2021-11-30 DIAGNOSIS — R0902 Hypoxemia: Secondary | ICD-10-CM | POA: Diagnosis not present

## 2021-11-30 DIAGNOSIS — I10 Essential (primary) hypertension: Secondary | ICD-10-CM | POA: Diagnosis not present

## 2021-11-30 DIAGNOSIS — E041 Nontoxic single thyroid nodule: Secondary | ICD-10-CM | POA: Diagnosis not present

## 2021-11-30 DIAGNOSIS — K219 Gastro-esophageal reflux disease without esophagitis: Secondary | ICD-10-CM | POA: Diagnosis not present

## 2021-11-30 DIAGNOSIS — Z9981 Dependence on supplemental oxygen: Secondary | ICD-10-CM | POA: Diagnosis not present

## 2021-11-30 DIAGNOSIS — E1129 Type 2 diabetes mellitus with other diabetic kidney complication: Secondary | ICD-10-CM | POA: Diagnosis not present

## 2021-11-30 DIAGNOSIS — E782 Mixed hyperlipidemia: Secondary | ICD-10-CM | POA: Diagnosis not present

## 2021-11-30 DIAGNOSIS — E1165 Type 2 diabetes mellitus with hyperglycemia: Secondary | ICD-10-CM | POA: Diagnosis not present

## 2021-11-30 DIAGNOSIS — G4733 Obstructive sleep apnea (adult) (pediatric): Secondary | ICD-10-CM | POA: Diagnosis not present

## 2021-11-30 NOTE — Telephone Encounter (Signed)
Phoned and spoke with the pt to see if she was out of her Linzess and she was not she just wanted [place] to start on her paperwork.  I advised the pt that once I am done I will call her because she has parts she has to do. Pt expressed understanding

## 2021-11-30 NOTE — Telephone Encounter (Signed)
FYI: Paperwork to be done for assistance on your desk in red folder for pt's Linzess

## 2021-11-30 NOTE — Telephone Encounter (Signed)
Patient called and she said that she needs her linzess paperwork sent to [facility].  She is out

## 2021-12-01 DIAGNOSIS — D631 Anemia in chronic kidney disease: Secondary | ICD-10-CM | POA: Diagnosis not present

## 2021-12-01 DIAGNOSIS — Z992 Dependence on renal dialysis: Secondary | ICD-10-CM | POA: Diagnosis not present

## 2021-12-01 DIAGNOSIS — N186 End stage renal disease: Secondary | ICD-10-CM | POA: Diagnosis not present

## 2021-12-01 DIAGNOSIS — D509 Iron deficiency anemia, unspecified: Secondary | ICD-10-CM | POA: Diagnosis not present

## 2021-12-01 DIAGNOSIS — N2581 Secondary hyperparathyroidism of renal origin: Secondary | ICD-10-CM | POA: Diagnosis not present

## 2021-12-04 DIAGNOSIS — Z20822 Contact with and (suspected) exposure to covid-19: Secondary | ICD-10-CM | POA: Diagnosis not present

## 2021-12-04 DIAGNOSIS — N186 End stage renal disease: Secondary | ICD-10-CM | POA: Diagnosis not present

## 2021-12-04 DIAGNOSIS — D509 Iron deficiency anemia, unspecified: Secondary | ICD-10-CM | POA: Diagnosis not present

## 2021-12-04 DIAGNOSIS — N2581 Secondary hyperparathyroidism of renal origin: Secondary | ICD-10-CM | POA: Diagnosis not present

## 2021-12-04 DIAGNOSIS — D631 Anemia in chronic kidney disease: Secondary | ICD-10-CM | POA: Diagnosis not present

## 2021-12-04 DIAGNOSIS — Z992 Dependence on renal dialysis: Secondary | ICD-10-CM | POA: Diagnosis not present

## 2021-12-06 DIAGNOSIS — Z992 Dependence on renal dialysis: Secondary | ICD-10-CM | POA: Diagnosis not present

## 2021-12-06 DIAGNOSIS — D509 Iron deficiency anemia, unspecified: Secondary | ICD-10-CM | POA: Diagnosis not present

## 2021-12-06 DIAGNOSIS — N186 End stage renal disease: Secondary | ICD-10-CM | POA: Diagnosis not present

## 2021-12-06 DIAGNOSIS — D631 Anemia in chronic kidney disease: Secondary | ICD-10-CM | POA: Diagnosis not present

## 2021-12-06 DIAGNOSIS — N2581 Secondary hyperparathyroidism of renal origin: Secondary | ICD-10-CM | POA: Diagnosis not present

## 2021-12-07 ENCOUNTER — Encounter: Payer: Self-pay | Admitting: Internal Medicine

## 2021-12-07 ENCOUNTER — Ambulatory Visit (INDEPENDENT_AMBULATORY_CARE_PROVIDER_SITE_OTHER): Payer: Medicare Other | Admitting: Internal Medicine

## 2021-12-07 VITALS — BP 150/80 | HR 92 | Ht 63.0 in | Wt 234.2 lb

## 2021-12-07 DIAGNOSIS — Z13220 Encounter for screening for lipoid disorders: Secondary | ICD-10-CM | POA: Diagnosis not present

## 2021-12-07 MED ORDER — ASPIRIN EC 81 MG PO TBEC: 81.0000 mg | DELAYED_RELEASE_TABLET | Freq: Every day | ORAL | 3 refills | Status: AC

## 2021-12-07 NOTE — Progress Notes (Signed)
Cardiology Office Note   Date:  12/07/2021   ID:  Patricia [name], DOB [birth year], MRN [id number]  PCP:  Jake Samples, PA-C  Cardiologist:   Dorris Carnes, MD   Pt presents for evaluatoin of SOB and CAD    History of Present Illness: Patricia [name] is a 50 y.o. female with a history of DM, HTN, diastolic CHF, obesity, asthma ESRD, OSA  Pt developed COVID in 2020 and developed severe pulmonary complications after Seen in ED in July for SOB and DOE  Had not been on oxygen chronically before that point   Admitted for post covid Pulmonary fibrosis, obliterative bronchiolitis  Has been on Home O2 and CPAP ant night   The pt was seen admitted to [facility] on 12/10  She says that she ws driving car and got acutely SOB Presented with hypertensive emergency   Acute pulmonary edema  Pt says it came on suddenly    She underwent emergent HD with 2.9 L being removed   Treated with Nebs, steroids and ABX   Improved but still desatted at night   Goal for HD was to wt of 101.9 (previously had been 103)  Since d/c she has been fair   Still gets SOB with activity   Wears O2 continuously   Sats drop when off    Denies CP  No palpitations  No dizziness   Diet Breakfast  Skips Lunch  Salad if eats   Dinner   Chicken, salad, veggies Drinks  Water, diet drinks      Current Meds  Medication Sig   acetaminophen (TYLENOL) 325 MG tablet Take 650 mg by mouth every 6 (six) hours as needed.   albuterol (PROVENTIL) (2.5 MG/3ML) 0.083% nebulizer solution Take 3 mLs (2.5 mg total) by nebulization every 6 (six) hours as needed for wheezing or shortness of breath.   cinacalcet (SENSIPAR) 60 MG tablet Take 30 mg by mouth daily with breakfast.   dicyclomine (BENTYL) 10 MG capsule Take 10 mg by mouth 2 (two) times daily as needed.   ferric citrate (AURYXIA) 1 GM 210 MG(Fe) tablet Take 2 tablets by mouth 2 (two) times daily with a meal. With Breakfast & with supper   fluticasone (FLONASE) 50 MCG/ACT nasal spray Place 2  sprays into both nostrils 2 (two) times daily.   glipiZIDE (GLUCOTROL) 5 MG tablet Take 10 mg by mouth 2 (two) times daily as needed. For blood sugar levels over 150   ipratropium (ATROVENT) 0.03 % nasal spray Place 2 sprays into both nostrils 2 (two) times daily.    labetalol (NORMODYNE) 200 MG tablet Take 200 mg by mouth 2 (two) times daily.   linaclotide (LINZESS) 290 MCG CAPS capsule Take 290 mcg by mouth daily before breakfast.   montelukast (SINGULAIR) 10 MG tablet Take 1 tablet (10 mg total) by mouth at bedtime.   multivitamin (RENA-VIT) TABS tablet Take 1 tablet by mouth daily.   NIFEdipine (PROCARDIA XL/NIFEDICAL-XL) 90 MG 24 hr tablet Take 90 mg by mouth at bedtime.   ondansetron (ZOFRAN-ODT) 4 MG disintegrating tablet Take 4 mg by mouth as needed for nausea or vomiting.    pantoprazole (PROTONIX) 40 MG tablet Take 1 tablet (40 mg total) by mouth daily. 30 minutes before breakfast   rOPINIRole (REQUIP XL) 2 MG 24 hr tablet Take 2 mg by mouth at bedtime.    sevelamer carbonate (RENVELA) 800 MG tablet Take 800 mg by mouth See admin instructions. Take 2 tablets (1600 mg) in the morning,  take 3 tablets (2400 mg) with lunch or snack,  and take 2 tablets (1600 mg) by mouth in the evening with dinner meal.   sodium chloride (OCEAN) 0.65 % SOLN nasal spray Place 1 spray into both nostrils as needed for congestion.   [DISCONTINUED] labetalol (NORMODYNE) 100 MG tablet Take 1 tablet (100 mg total) by mouth 2 (two) times daily.     Allergies:   Amlodipine besylate and Reglan [metoclopramide]   Past Medical History:  Diagnosis Date   Anemia    Ankle fracture    Arthritis    Blood transfusion without reported diagnosis    Breast cancer ([place])    Cancer ([place])    Diabetes mellitus without complication ([place])    Dialysis patient ([place])    mon, wed, friday,    End stage renal disease on dialysis ([place])    M/W/F [facility] in [place]   GERD (gastroesophageal reflux disease)    Hypertension    OSA  (obstructive sleep apnea)    uses CPAP sometimes   Pneumonia    PONV (postoperative nausea and vomiting)    Wears glasses     Past Surgical History:  Procedure Laterality Date   ABDOMINAL HYSTERECTOMY     AV FISTULA PLACEMENT  11/2014   at [place] N/A 04/2021   BALLOON DILATION N/A 07/10/2016   Procedure: BALLOON DILATION;  Surgeon: Danie Binder, MD;  [facility];  Service: Endoscopy;  Laterality: N/A;  Pyloric dilation   BREAST LUMPECTOMY     CESAREAN SECTION     CHOLECYSTECTOMY     COLONOSCOPY WITH PROPOFOL N/A 09/27/2016   Dr. Gala Romney: Internal hemorrhoids repeat colonoscopy in 10 years   DILATION AND CURETTAGE OF UTERUS     ESOPHAGOGASTRODUODENOSCOPY N/A 07/10/2016   Dr.Fields- normal esophagus, gastric stenosis was found at the pylorus, gastritis on bx, normal examined duodenun   EXTERNAL FIXATION REMOVAL Right 10/29/2018   Procedure: REMOVAL RIGHT ANKLE BIOMET ZIMMER EXTERNAL FIXATOR, SHORT LEG CAST APPLICATION;  Surgeon: Marybelle Killings, MD;  Location: [facility];  Service: Orthopedics;  Laterality: Right;   MASTECTOMY     left sided   ORIF ANKLE FRACTURE Right 10/06/2018   Procedure: OPEN REDUCTION INTERNAL FIXATION (ORIF) RIGHT ANKLE TRIMALLEOLAR;  Surgeon: Marybelle Killings, MD;  Location: [facility];  Service: Orthopedics;  Laterality: Right;     Social History:  The patient  reports that she has never smoked. She has never used smokeless tobacco. She reports that she does not drink alcohol and does not use drugs.   Family History:  The patient's family history includes Diabetes Mellitus II in her mother; Hypertension in her mother, sister, and sister.    ROS:  Please see the history of present illness. All other systems are reviewed and  Negative to the above problem except as noted.    PHYSICAL EXAM: VS:  BP (!) 150/80    Pulse 92    Ht 5\' 3"  (1.6 m)    Wt 234 lb 3.2 oz (106.2 kg)    SpO2 95%    BMI 41.49 kg/m    BP on my check 135/60     GEN: Morbidly obese 50 yo  in no acute distress  HEENT: normal  Neck: no JVD, no carotid bruits, Cardiac: RRR; no murmurs  No LE  edema  Respiratory:  clear to auscultation bilaterally,  GI: Obese  Nontender   + BS   MS: no deformity Moving all extremities  Skin: warm and dry, no rash Neuro:  Strength and sensation are intact Psych: euthymic mood, full affect   EKG:  EKG is not ordered today.  High resolution CT   09/2021 1. Mild, mosaic attenuation of the airspaces, similar to prior examination. Mild associated air trapping on expiratory phase imaging. Findings suggest sequelae of prior infection or inflammation with some component of obliterative bronchiolitis and small airways disease. No evidence of fibrotic interstitial lung disease. 2. Cardiomegaly and coronary artery disease.   Aortic Atherosclerosis (ICD10-I70.0).  Echo  06/27/21 1. Left ventricular ejection fraction, by estimation, is 55 to 60%. The left ventricle has normal function. The left ventricle has no regional wall motion abnormalities. There is mild left ventricular hypertrophy. Left ventricular diastolic parameters are consistent with Grade II diastolic dysfunction (pseudonormalization). 2. Right ventricular systolic function is low normal. The right ventricular size is normal. There is normal pulmonary artery systolic pressure. The estimated right ventricular systolic pressure is 68.3 mmHg. 3. Left atrial size was mildly dilated. 4. There is a trivial pericardial effusion posterior to the left ventricle. 5. The mitral valve is grossly normal. Mild mitral valve regurgitation. 6. The aortic valve is tricuspid. Aortic valve regurgitation is not visualized. Mild to moderate aortic valve sclerosis/calcification is present, without any evidence of aortic stenosis. Aortic valve mean gradient measures 7.0 mmHg. 7. The inferior vena cava is normal in size with greater than 50% respiratory variability, suggesting  right atrial pressure of 3 mmHg.  Lipid Panel    Component Value Date/Time   TRIG 64 12/29/2019 1500      Wt Readings from Last 3 Encounters:  12/07/21 234 lb 3.2 oz (106.2 kg)  10/31/21 235 lb 12.8 oz (107 kg)  08/29/21 248 lb (112.5 kg)      ASSESSMENT AND PLAN:  1 Dyspnea.   Pt with multiple etiologies/contributors    Chronic lung pathology, ESRD, HTN,   Maintaining correct dry weight will be important to avoid events that she had earlier this month Echo in July 4196 showed diastolic dysfunction which is further exacerbated when loading conditions not optimal I am not convinced that presentation is due to active ischemia    But if clinical course changes would plan ischemic eval to r/o  2  CAD   As noted above   I am not convinced of active angina   Follow  Would start ASA  3 HL  Need to get lipd  Pt should be on a statin     4  Pulmonary  Follows with [facility] S[facility]  5  ESRD   on HD in [place]  6  Obesity   Discussed did   OVerall appears pretty good    Will be difficult to lose    Current medicines are reviewed at length with the patient today.  The patient does not have concerns regarding medicines.  Signed, Dorris Carnes, MD  12/07/2021 9:08 AM    [address], [place], [place]  [address] Phone: [phone]; Fax: [phone]

## 2021-12-07 NOTE — Patient Instructions (Signed)
Medication Instructions:   Start Aspirin 81 mg Daily   *If you need a refill on your cardiac medications before your next appointment, please call your pharmacy*   Lab Work: Your physician recommends that you return for lab work in: Today ( Lipid)   If you have labs (blood work) drawn today and your tests are completely normal, you will receive your results only by: [place] (if you have MyChart) OR A paper copy in the mail If you have any lab test that is abnormal or we need to change your treatment, we will call you to review the results.   Testing/Procedures: NONE    Follow-Up: At [facility], you and your health needs are our priority.  As part of our continuing mission to provide you with exceptional heart care, we have created designated Provider Care Teams.  These Care Teams include your primary Cardiologist (physician) and Advanced Practice Providers (APPs -  Physician Assistants and Nurse Practitioners) who all work together to provide you with the care you need, when you need it.  We recommend signing up for the patient portal called "MyChart".  Sign up information is provided on this After Visit Summary.  MyChart is used to connect with patients for Virtual Visits (Telemedicine).  Patients are able to view lab/test results, encounter notes, upcoming appointments, etc.  Non-urgent messages can be sent to your provider as well.   To learn more about what you can do with MyChart, go to [email or web address].    Your next appointment:    May 2023  The format for your next appointment:   In Person  Provider:   Dorris Carnes, MD    Other Instructions Thank you for choosing [place] [facility]!

## 2021-12-08 DIAGNOSIS — D509 Iron deficiency anemia, unspecified: Secondary | ICD-10-CM | POA: Diagnosis not present

## 2021-12-08 DIAGNOSIS — N186 End stage renal disease: Secondary | ICD-10-CM | POA: Diagnosis not present

## 2021-12-08 DIAGNOSIS — Z992 Dependence on renal dialysis: Secondary | ICD-10-CM | POA: Diagnosis not present

## 2021-12-08 DIAGNOSIS — N2581 Secondary hyperparathyroidism of renal origin: Secondary | ICD-10-CM | POA: Diagnosis not present

## 2021-12-08 DIAGNOSIS — D631 Anemia in chronic kidney disease: Secondary | ICD-10-CM | POA: Diagnosis not present

## 2021-12-11 DIAGNOSIS — D508 Other iron deficiency anemias: Secondary | ICD-10-CM | POA: Diagnosis not present

## 2021-12-11 DIAGNOSIS — D631 Anemia in chronic kidney disease: Secondary | ICD-10-CM | POA: Diagnosis not present

## 2021-12-11 DIAGNOSIS — N2581 Secondary hyperparathyroidism of renal origin: Secondary | ICD-10-CM | POA: Diagnosis not present

## 2021-12-11 DIAGNOSIS — N186 End stage renal disease: Secondary | ICD-10-CM | POA: Diagnosis not present

## 2021-12-11 DIAGNOSIS — Z992 Dependence on renal dialysis: Secondary | ICD-10-CM | POA: Diagnosis not present

## 2021-12-13 DIAGNOSIS — N186 End stage renal disease: Secondary | ICD-10-CM | POA: Diagnosis not present

## 2021-12-13 DIAGNOSIS — D631 Anemia in chronic kidney disease: Secondary | ICD-10-CM | POA: Diagnosis not present

## 2021-12-13 DIAGNOSIS — D508 Other iron deficiency anemias: Secondary | ICD-10-CM | POA: Diagnosis not present

## 2021-12-13 DIAGNOSIS — N2581 Secondary hyperparathyroidism of renal origin: Secondary | ICD-10-CM | POA: Diagnosis not present

## 2021-12-13 DIAGNOSIS — Z992 Dependence on renal dialysis: Secondary | ICD-10-CM | POA: Diagnosis not present

## 2021-12-15 DIAGNOSIS — D631 Anemia in chronic kidney disease: Secondary | ICD-10-CM | POA: Diagnosis not present

## 2021-12-15 DIAGNOSIS — N186 End stage renal disease: Secondary | ICD-10-CM | POA: Diagnosis not present

## 2021-12-15 DIAGNOSIS — N2581 Secondary hyperparathyroidism of renal origin: Secondary | ICD-10-CM | POA: Diagnosis not present

## 2021-12-15 DIAGNOSIS — D508 Other iron deficiency anemias: Secondary | ICD-10-CM | POA: Diagnosis not present

## 2021-12-15 DIAGNOSIS — Z992 Dependence on renal dialysis: Secondary | ICD-10-CM | POA: Diagnosis not present

## 2021-12-16 DIAGNOSIS — N186 End stage renal disease: Secondary | ICD-10-CM | POA: Diagnosis not present

## 2021-12-16 DIAGNOSIS — Z992 Dependence on renal dialysis: Secondary | ICD-10-CM | POA: Diagnosis not present

## 2021-12-17 DIAGNOSIS — N2581 Secondary hyperparathyroidism of renal origin: Secondary | ICD-10-CM | POA: Diagnosis not present

## 2021-12-17 DIAGNOSIS — D631 Anemia in chronic kidney disease: Secondary | ICD-10-CM | POA: Diagnosis not present

## 2021-12-17 DIAGNOSIS — D509 Iron deficiency anemia, unspecified: Secondary | ICD-10-CM | POA: Diagnosis not present

## 2021-12-17 DIAGNOSIS — Z23 Encounter for immunization: Secondary | ICD-10-CM | POA: Diagnosis not present

## 2021-12-17 DIAGNOSIS — N186 End stage renal disease: Secondary | ICD-10-CM | POA: Diagnosis not present

## 2021-12-17 DIAGNOSIS — Z992 Dependence on renal dialysis: Secondary | ICD-10-CM | POA: Diagnosis not present

## 2021-12-18 DIAGNOSIS — Z23 Encounter for immunization: Secondary | ICD-10-CM | POA: Diagnosis not present

## 2021-12-18 DIAGNOSIS — D631 Anemia in chronic kidney disease: Secondary | ICD-10-CM | POA: Diagnosis not present

## 2021-12-18 DIAGNOSIS — N2581 Secondary hyperparathyroidism of renal origin: Secondary | ICD-10-CM | POA: Diagnosis not present

## 2021-12-18 DIAGNOSIS — Z992 Dependence on renal dialysis: Secondary | ICD-10-CM | POA: Diagnosis not present

## 2021-12-18 DIAGNOSIS — N186 End stage renal disease: Secondary | ICD-10-CM | POA: Diagnosis not present

## 2021-12-18 DIAGNOSIS — D509 Iron deficiency anemia, unspecified: Secondary | ICD-10-CM | POA: Diagnosis not present

## 2021-12-20 DIAGNOSIS — N186 End stage renal disease: Secondary | ICD-10-CM | POA: Diagnosis not present

## 2021-12-20 DIAGNOSIS — Z23 Encounter for immunization: Secondary | ICD-10-CM | POA: Diagnosis not present

## 2021-12-20 DIAGNOSIS — D509 Iron deficiency anemia, unspecified: Secondary | ICD-10-CM | POA: Diagnosis not present

## 2021-12-20 DIAGNOSIS — N2581 Secondary hyperparathyroidism of renal origin: Secondary | ICD-10-CM | POA: Diagnosis not present

## 2021-12-20 DIAGNOSIS — D631 Anemia in chronic kidney disease: Secondary | ICD-10-CM | POA: Diagnosis not present

## 2021-12-20 DIAGNOSIS — Z992 Dependence on renal dialysis: Secondary | ICD-10-CM | POA: Diagnosis not present

## 2021-12-21 DIAGNOSIS — E1129 Type 2 diabetes mellitus with other diabetic kidney complication: Secondary | ICD-10-CM | POA: Diagnosis not present

## 2021-12-21 DIAGNOSIS — N185 Chronic kidney disease, stage 5: Secondary | ICD-10-CM | POA: Diagnosis not present

## 2021-12-21 DIAGNOSIS — J069 Acute upper respiratory infection, unspecified: Secondary | ICD-10-CM | POA: Diagnosis not present

## 2021-12-22 DIAGNOSIS — Z992 Dependence on renal dialysis: Secondary | ICD-10-CM | POA: Diagnosis not present

## 2021-12-22 DIAGNOSIS — N2581 Secondary hyperparathyroidism of renal origin: Secondary | ICD-10-CM | POA: Diagnosis not present

## 2021-12-22 DIAGNOSIS — Z23 Encounter for immunization: Secondary | ICD-10-CM | POA: Diagnosis not present

## 2021-12-22 DIAGNOSIS — D509 Iron deficiency anemia, unspecified: Secondary | ICD-10-CM | POA: Diagnosis not present

## 2021-12-22 DIAGNOSIS — D631 Anemia in chronic kidney disease: Secondary | ICD-10-CM | POA: Diagnosis not present

## 2021-12-22 DIAGNOSIS — N186 End stage renal disease: Secondary | ICD-10-CM | POA: Diagnosis not present

## 2021-12-23 ENCOUNTER — Other Ambulatory Visit: Payer: Self-pay | Admitting: Gastroenterology

## 2021-12-23 DIAGNOSIS — D631 Anemia in chronic kidney disease: Secondary | ICD-10-CM | POA: Diagnosis not present

## 2021-12-23 DIAGNOSIS — Z992 Dependence on renal dialysis: Secondary | ICD-10-CM | POA: Diagnosis not present

## 2021-12-23 DIAGNOSIS — Z23 Encounter for immunization: Secondary | ICD-10-CM | POA: Diagnosis not present

## 2021-12-23 DIAGNOSIS — D509 Iron deficiency anemia, unspecified: Secondary | ICD-10-CM | POA: Diagnosis not present

## 2021-12-23 DIAGNOSIS — N2581 Secondary hyperparathyroidism of renal origin: Secondary | ICD-10-CM | POA: Diagnosis not present

## 2021-12-23 DIAGNOSIS — N186 End stage renal disease: Secondary | ICD-10-CM | POA: Diagnosis not present

## 2021-12-25 DIAGNOSIS — N186 End stage renal disease: Secondary | ICD-10-CM | POA: Diagnosis not present

## 2021-12-25 DIAGNOSIS — Z992 Dependence on renal dialysis: Secondary | ICD-10-CM | POA: Diagnosis not present

## 2021-12-25 DIAGNOSIS — D509 Iron deficiency anemia, unspecified: Secondary | ICD-10-CM | POA: Diagnosis not present

## 2021-12-25 DIAGNOSIS — N2581 Secondary hyperparathyroidism of renal origin: Secondary | ICD-10-CM | POA: Diagnosis not present

## 2021-12-25 DIAGNOSIS — Z23 Encounter for immunization: Secondary | ICD-10-CM | POA: Diagnosis not present

## 2021-12-25 DIAGNOSIS — D631 Anemia in chronic kidney disease: Secondary | ICD-10-CM | POA: Diagnosis not present

## 2021-12-26 ENCOUNTER — Other Ambulatory Visit (HOSPITAL_COMMUNITY): Payer: Self-pay | Admitting: Family Medicine

## 2021-12-26 ENCOUNTER — Other Ambulatory Visit: Payer: Self-pay | Admitting: Family Medicine

## 2021-12-26 DIAGNOSIS — E041 Nontoxic single thyroid nodule: Secondary | ICD-10-CM

## 2021-12-27 DIAGNOSIS — N186 End stage renal disease: Secondary | ICD-10-CM | POA: Diagnosis not present

## 2021-12-27 DIAGNOSIS — Z23 Encounter for immunization: Secondary | ICD-10-CM | POA: Diagnosis not present

## 2021-12-27 DIAGNOSIS — D509 Iron deficiency anemia, unspecified: Secondary | ICD-10-CM | POA: Diagnosis not present

## 2021-12-27 DIAGNOSIS — D631 Anemia in chronic kidney disease: Secondary | ICD-10-CM | POA: Diagnosis not present

## 2021-12-27 DIAGNOSIS — Z992 Dependence on renal dialysis: Secondary | ICD-10-CM | POA: Diagnosis not present

## 2021-12-27 DIAGNOSIS — N2581 Secondary hyperparathyroidism of renal origin: Secondary | ICD-10-CM | POA: Diagnosis not present

## 2021-12-29 DIAGNOSIS — Z992 Dependence on renal dialysis: Secondary | ICD-10-CM | POA: Diagnosis not present

## 2021-12-29 DIAGNOSIS — Z23 Encounter for immunization: Secondary | ICD-10-CM | POA: Diagnosis not present

## 2021-12-29 DIAGNOSIS — N2581 Secondary hyperparathyroidism of renal origin: Secondary | ICD-10-CM | POA: Diagnosis not present

## 2021-12-29 DIAGNOSIS — D509 Iron deficiency anemia, unspecified: Secondary | ICD-10-CM | POA: Diagnosis not present

## 2021-12-29 DIAGNOSIS — D631 Anemia in chronic kidney disease: Secondary | ICD-10-CM | POA: Diagnosis not present

## 2021-12-29 DIAGNOSIS — N186 End stage renal disease: Secondary | ICD-10-CM | POA: Diagnosis not present

## 2022-01-01 DIAGNOSIS — Z992 Dependence on renal dialysis: Secondary | ICD-10-CM | POA: Diagnosis not present

## 2022-01-01 DIAGNOSIS — D509 Iron deficiency anemia, unspecified: Secondary | ICD-10-CM | POA: Diagnosis not present

## 2022-01-01 DIAGNOSIS — N2581 Secondary hyperparathyroidism of renal origin: Secondary | ICD-10-CM | POA: Diagnosis not present

## 2022-01-01 DIAGNOSIS — Z23 Encounter for immunization: Secondary | ICD-10-CM | POA: Diagnosis not present

## 2022-01-01 DIAGNOSIS — D631 Anemia in chronic kidney disease: Secondary | ICD-10-CM | POA: Diagnosis not present

## 2022-01-01 DIAGNOSIS — N186 End stage renal disease: Secondary | ICD-10-CM | POA: Diagnosis not present

## 2022-01-03 DIAGNOSIS — Z23 Encounter for immunization: Secondary | ICD-10-CM | POA: Diagnosis not present

## 2022-01-03 DIAGNOSIS — Z992 Dependence on renal dialysis: Secondary | ICD-10-CM | POA: Diagnosis not present

## 2022-01-03 DIAGNOSIS — D631 Anemia in chronic kidney disease: Secondary | ICD-10-CM | POA: Diagnosis not present

## 2022-01-03 DIAGNOSIS — N2581 Secondary hyperparathyroidism of renal origin: Secondary | ICD-10-CM | POA: Diagnosis not present

## 2022-01-03 DIAGNOSIS — N186 End stage renal disease: Secondary | ICD-10-CM | POA: Diagnosis not present

## 2022-01-03 DIAGNOSIS — D509 Iron deficiency anemia, unspecified: Secondary | ICD-10-CM | POA: Diagnosis not present

## 2022-01-04 ENCOUNTER — Ambulatory Visit (HOSPITAL_COMMUNITY)
Admission: RE | Admit: 2022-01-04 | Discharge: 2022-01-04 | Disposition: A | Discharge status: Home / Self Care | Payer: Medicare Other | Source: Ambulatory Visit | Attending: Family Medicine | Admitting: Family Medicine

## 2022-01-04 ENCOUNTER — Other Ambulatory Visit (HOSPITAL_COMMUNITY)
Admission: RE | Admit: 2022-01-04 | Discharge: 2022-01-04 | Disposition: A | Discharge status: Home / Self Care | Payer: Medicare Other | Source: Ambulatory Visit | Attending: Internal Medicine | Admitting: Internal Medicine

## 2022-01-04 ENCOUNTER — Other Ambulatory Visit: Payer: Self-pay

## 2022-01-04 DIAGNOSIS — Z13220 Encounter for screening for lipoid disorders: Secondary | ICD-10-CM | POA: Insufficient documentation

## 2022-01-04 DIAGNOSIS — E041 Nontoxic single thyroid nodule: Secondary | ICD-10-CM | POA: Diagnosis not present

## 2022-01-04 DIAGNOSIS — E785 Hyperlipidemia, unspecified: Secondary | ICD-10-CM | POA: Diagnosis not present

## 2022-01-04 LAB — LIPID PANEL
Cholesterol: 206 mg/dL — ABNORMAL HIGH (ref 0–200)
HDL: 106 mg/dL (ref >40)
LDL Cholesterol: 90 mg/dL (ref 0–99)
Total CHOL/HDL Ratio: 1.9 RATIO
Triglycerides: 51 mg/dL (ref <150)
VLDL: 10 mg/dL (ref 0–40)

## 2022-01-05 DIAGNOSIS — Z23 Encounter for immunization: Secondary | ICD-10-CM | POA: Diagnosis not present

## 2022-01-05 DIAGNOSIS — N2581 Secondary hyperparathyroidism of renal origin: Secondary | ICD-10-CM | POA: Diagnosis not present

## 2022-01-05 DIAGNOSIS — N186 End stage renal disease: Secondary | ICD-10-CM | POA: Diagnosis not present

## 2022-01-05 DIAGNOSIS — D631 Anemia in chronic kidney disease: Secondary | ICD-10-CM | POA: Diagnosis not present

## 2022-01-05 DIAGNOSIS — Z992 Dependence on renal dialysis: Secondary | ICD-10-CM | POA: Diagnosis not present

## 2022-01-05 DIAGNOSIS — D509 Iron deficiency anemia, unspecified: Secondary | ICD-10-CM | POA: Diagnosis not present

## 2022-01-08 ENCOUNTER — Telehealth: Payer: Self-pay

## 2022-01-08 DIAGNOSIS — N186 End stage renal disease: Secondary | ICD-10-CM | POA: Diagnosis not present

## 2022-01-08 DIAGNOSIS — N2581 Secondary hyperparathyroidism of renal origin: Secondary | ICD-10-CM | POA: Diagnosis not present

## 2022-01-08 DIAGNOSIS — Z79899 Other long term (current) drug therapy: Secondary | ICD-10-CM

## 2022-01-08 DIAGNOSIS — D509 Iron deficiency anemia, unspecified: Secondary | ICD-10-CM | POA: Diagnosis not present

## 2022-01-08 DIAGNOSIS — D631 Anemia in chronic kidney disease: Secondary | ICD-10-CM | POA: Diagnosis not present

## 2022-01-08 DIAGNOSIS — Z992 Dependence on renal dialysis: Secondary | ICD-10-CM | POA: Diagnosis not present

## 2022-01-08 DIAGNOSIS — Z23 Encounter for immunization: Secondary | ICD-10-CM | POA: Diagnosis not present

## 2022-01-08 DIAGNOSIS — Z13220 Encounter for screening for lipoid disorders: Secondary | ICD-10-CM

## 2022-01-08 DIAGNOSIS — E785 Hyperlipidemia, unspecified: Secondary | ICD-10-CM

## 2022-01-08 MED ORDER — ROSUVASTATIN CALCIUM 10 MG PO TABS: 10.0000 mg | ORAL_TABLET | Freq: Every day | ORAL | 3 refills | Status: DC

## 2022-01-08 NOTE — Telephone Encounter (Signed)
-----   Message from Dorris Carnes V, MD sent at 01/08/2022  6:50 AM EST ----- Lipid panel shows LDL 90   HDL 106 (excellent) With medical problems  however LDL should be lower  Would recomm Crestor 10 mg    F/U lipids in 8 wks with AST

## 2022-01-08 NOTE — Telephone Encounter (Signed)
The patient has been notified of the result and verbalized understanding.  All questions (if any) were answered. She will have repeat fasting labs 03/12/22 in [place].

## 2022-01-09 ENCOUNTER — Ambulatory Visit (INDEPENDENT_AMBULATORY_CARE_PROVIDER_SITE_OTHER): Payer: Medicare Other | Admitting: Pulmonary Disease

## 2022-01-09 ENCOUNTER — Encounter: Payer: Self-pay | Admitting: Pulmonary Disease

## 2022-01-09 ENCOUNTER — Other Ambulatory Visit: Payer: Self-pay

## 2022-01-09 VITALS — BP 132/80 | HR 84 | Temp 98.2°F | Ht 63.0 in | Wt 238.0 lb

## 2022-01-09 DIAGNOSIS — J9611 Chronic respiratory failure with hypoxia: Secondary | ICD-10-CM | POA: Diagnosis not present

## 2022-01-09 DIAGNOSIS — G4733 Obstructive sleep apnea (adult) (pediatric): Secondary | ICD-10-CM

## 2022-01-09 DIAGNOSIS — J42 Unspecified chronic bronchitis: Secondary | ICD-10-CM | POA: Diagnosis not present

## 2022-01-09 NOTE — Patient Instructions (Signed)
Will try to get a copy of your CPAP report from [place]  Follow up in 6 months

## 2022-01-09 NOTE — Progress Notes (Signed)
[facility]  Chief Complaint  Patient presents with   Follow-up    Still using 2LO2 24/7.  Had recent hosp stay at [facility] for pneumonia     Constitutional:  BP 132/80 (BP Location: Left Arm, Patient Position: Sitting)    Pulse 84    Temp 98.2 F (36.8 C) (Temporal)    Ht 5' 3"  (1.6 m)    Wt 238 lb (108 kg)    SpO2 98% Comment: 2LO2 pulse   BMI 42.16 kg/m   Past Medical History:  Anemia, OA, Breast cancer, DM, ESRD on iHD, GERD, HTN, COVID pneumonia January 2021  Past Surgical History:  She  has a past surgical history that includes Cholecystectomy; Cesarean section; Dilation and curettage of uterus; Abdominal hysterectomy; AV fistula placement (11/2014); Esophagogastroduodenoscopy (N/A, 07/10/2016); Balloon dilation (N/A, 07/10/2016); Colonoscopy with propofol (N/A, 09/27/2016); Mastectomy; Breast lumpectomy; ORIF ankle fracture (Right, 10/06/2018); External fixation removal (Right, 10/29/2018); and AV fistula repair (N/A, 04/2021).  Brief Summary:  Patricia [name] is a 51 y.o. 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  She had viral respiratory infection in May 2022 and developed symptoms after this.      Subjective:   She was in hospital at [facility] in December for pneumonia, hypertensive emergency and acute pulmonary edema.  Home sleep study hasn't been completed yet.  She has been using CPAP.  Works better since she got new mask.  Feels pressure is okay.  Her SpO2 at rest has stayed above 90% on room air.  Still drops when she is walking, and she uses her oxygen then.    Has a dry cough.  Not having wheeze, fever, or chest pain.  Found to have thyroid nodule and has biopsy scheduled for 01/23/22.   Physical Exam:   Appearance - well kempt, wearing oxygen  ENMT - no sinus tenderness, no oral exudate, no LAN, Mallampati 4 airway, no stridor  Respiratory - equal  breath sounds bilaterally, no wheezing or rales  CV - s1s2 regular rate and rhythm, no murmurs  Ext - no clubbing, no edema  Skin - no rashes  Psych - normal mood and affect     Pulmonary testing:  PFT 01/09/18 >> FEV1 1.60 (70%), FEV1% 89, TLC 3.26 (66%), DLCO 55% PFT 10/17/21 >> FEV1 1.28 (57%), FEV1% 89, TLC 3.28 (66%), DLCO 27% Serology 10/31/21 >> ANCA, RF negative; ESR 11  Chest Imaging:  CT angio chest 06/26/21 >> mosaic attenuation of the airspaces, bandlike scarring at bases HRCT chest 10/11/21 >> mild, mosaic attenuation of airspaces, mild air trapping, scarring at bases CT angio chest 11/25/21 >> diffuse b/l GGO and consolidation concerning for pneumonia  Sleep Tests:    Cardiac Tests:  Echo 06/27/21 >> EF 55 to 60%, mild LVH, grade 2 DD, RVSP 29.6 mmHg, mild MR  Social History:  She  reports that she has never smoked. She has never used smokeless tobacco. She reports that she does not drink alcohol and does not use drugs.  Family History:  Her family history includes Diabetes Mellitus II in her mother; Hypertension in her mother, sister, and sister.     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 - intolerant of breztri - continue singulair - prn albuterol - will plan to repeat PFT in November 2023  Chronic respiratory failure with hypoxia. - 2 liters oxygen with exertion and sleep - gets supplies from [employer]  Obstructive sleep apnea. - she reports compliance with CPAP and benefit from therapy - gets supplies from [place] - will try to get a copy of her download and call her with results  Obesity. - discussed importance of weight loss  ESRD on iHD. - followed by [facility]  Coronary artery disease, Chronic diastolic CHF. - followed by Dr. Dorris Carnes with [place]  Thyroid nodule. - she is scheduled for biopsy with Dr. Kieth Brightly with [facility]  Time  Spent Involved in Patient Care on Day of Examination:  41 minutes  Follow up:   Patient Instructions  Will try to get a copy of your CPAP report from [place]  Follow up in 6 months  Medication List:   Allergies as of 01/09/2022       Reactions   Amlodipine Besylate Rash, Other (See Comments)   dizziness   Reglan [metoclopramide] Other (See Comments)   hallucinations        Medication List        Accurate as of January 09, 2022 10:26 AM. If you have any questions, ask your nurse or doctor.          acetaminophen 325 MG tablet Commonly known as: TYLENOL Take 650 mg by mouth every 6 (six) hours as needed.   albuterol (2.5 MG/3ML) 0.083% nebulizer solution Commonly known as: PROVENTIL Take 3 mLs (2.5 mg total) by nebulization every 6 (six) hours as needed for wheezing or shortness of breath.   aspirin EC 81 MG tablet Take 1 tablet (81 mg total) by mouth daily. Swallow whole.   cinacalcet 60 MG tablet Commonly known as: SENSIPAR Take 30 mg by mouth daily with breakfast.   dicyclomine 10 MG capsule Commonly known as: BENTYL Take 10 mg by mouth 2 (two) times daily as needed.   ferric citrate 1 GM 210 MG(Fe) tablet Commonly known as: AURYXIA Take 2 tablets by mouth 2 (two) times daily with a meal. With Breakfast & with supper   fluticasone 50 MCG/ACT nasal spray Commonly known as: FLONASE Place 2 sprays into both nostrils 2 (two) times daily.   glipiZIDE 5 MG tablet Commonly known as: GLUCOTROL Take 10 mg by mouth 2 (two) times daily as needed. For blood sugar levels over 150   ipratropium 0.03 % nasal spray Commonly known as: ATROVENT Place 2 sprays into both nostrils 2 (two) times daily.   labetalol 100 MG tablet Commonly known as: NORMODYNE Take 100 mg by mouth 2 (two) times daily. What changed: Another medication with the same name was removed. Continue taking this medication, and follow the directions you see here. Changed by: Chesley Mires, MD   linaclotide 290 MCG Caps capsule Commonly known as: LINZESS Take 290 mcg by mouth daily before breakfast.   montelukast 10 MG tablet Commonly known as: SINGULAIR Take 1 tablet (10 mg total) by mouth at bedtime.   multivitamin Tabs tablet Take 1 tablet by mouth daily.   NIFEdipine 60 MG 24 hr tablet Commonly known as: ADALAT CC Take 60 mg by mouth daily. What changed: Another medication with the same name was removed. Continue taking this medication, and follow the directions you see here. Changed by: Chesley Mires, MD   ondansetron 4 MG disintegrating tablet Commonly known as: ZOFRAN-ODT Take 4 mg by mouth as needed for nausea or vomiting.   pantoprazole 40 MG tablet Commonly known as: PROTONIX TAKE 1 TABLET (40 MG TOTAL) BY MOUTH DAILY. [place]  rOPINIRole 2 MG 24 hr tablet Commonly known as: REQUIP XL Take 2 mg by mouth at bedtime.   rosuvastatin 10 MG tablet Commonly known as: CRESTOR Take 1 tablet (10 mg total) by mouth daily.   sevelamer carbonate 800 MG tablet Commonly known as: RENVELA Take 800 mg by mouth See admin instructions. Take 2 tablets (1600 mg) in the morning, take 3 tablets (2400 mg) with lunch or snack,  and take 2 tablets (1600 mg) by mouth in the evening with dinner meal.   sodium chloride 0.65 % Soln nasal spray Commonly known as: OCEAN Place 1 spray into both nostrils as needed for congestion.        Signature:  Chesley Mires, MD [place] Pager - [phone] 01/09/2022, 10:26 AM

## 2022-01-10 DIAGNOSIS — Z23 Encounter for immunization: Secondary | ICD-10-CM | POA: Diagnosis not present

## 2022-01-10 DIAGNOSIS — Z992 Dependence on renal dialysis: Secondary | ICD-10-CM | POA: Diagnosis not present

## 2022-01-10 DIAGNOSIS — N186 End stage renal disease: Secondary | ICD-10-CM | POA: Diagnosis not present

## 2022-01-10 DIAGNOSIS — D631 Anemia in chronic kidney disease: Secondary | ICD-10-CM | POA: Diagnosis not present

## 2022-01-10 DIAGNOSIS — D509 Iron deficiency anemia, unspecified: Secondary | ICD-10-CM | POA: Diagnosis not present

## 2022-01-10 DIAGNOSIS — N2581 Secondary hyperparathyroidism of renal origin: Secondary | ICD-10-CM | POA: Diagnosis not present

## 2022-01-11 DIAGNOSIS — E782 Mixed hyperlipidemia: Secondary | ICD-10-CM | POA: Diagnosis not present

## 2022-01-11 DIAGNOSIS — E041 Nontoxic single thyroid nodule: Secondary | ICD-10-CM | POA: Diagnosis not present

## 2022-01-11 DIAGNOSIS — Z0000 Encounter for general adult medical examination without abnormal findings: Secondary | ICD-10-CM | POA: Diagnosis not present

## 2022-01-11 DIAGNOSIS — Z1331 Encounter for screening for depression: Secondary | ICD-10-CM | POA: Diagnosis not present

## 2022-01-11 DIAGNOSIS — I10 Essential (primary) hypertension: Secondary | ICD-10-CM | POA: Diagnosis not present

## 2022-01-11 DIAGNOSIS — Z6841 Body Mass Index (BMI) 40.0 and over, adult: Secondary | ICD-10-CM | POA: Diagnosis not present

## 2022-01-11 DIAGNOSIS — G4733 Obstructive sleep apnea (adult) (pediatric): Secondary | ICD-10-CM | POA: Diagnosis not present

## 2022-01-11 DIAGNOSIS — J069 Acute upper respiratory infection, unspecified: Secondary | ICD-10-CM | POA: Diagnosis not present

## 2022-01-11 DIAGNOSIS — E1165 Type 2 diabetes mellitus with hyperglycemia: Secondary | ICD-10-CM | POA: Diagnosis not present

## 2022-01-12 DIAGNOSIS — D509 Iron deficiency anemia, unspecified: Secondary | ICD-10-CM | POA: Diagnosis not present

## 2022-01-12 DIAGNOSIS — N2581 Secondary hyperparathyroidism of renal origin: Secondary | ICD-10-CM | POA: Diagnosis not present

## 2022-01-12 DIAGNOSIS — D631 Anemia in chronic kidney disease: Secondary | ICD-10-CM | POA: Diagnosis not present

## 2022-01-12 DIAGNOSIS — Z992 Dependence on renal dialysis: Secondary | ICD-10-CM | POA: Diagnosis not present

## 2022-01-12 DIAGNOSIS — Z23 Encounter for immunization: Secondary | ICD-10-CM | POA: Diagnosis not present

## 2022-01-12 DIAGNOSIS — N186 End stage renal disease: Secondary | ICD-10-CM | POA: Diagnosis not present

## 2022-01-15 DIAGNOSIS — Z992 Dependence on renal dialysis: Secondary | ICD-10-CM | POA: Diagnosis not present

## 2022-01-15 DIAGNOSIS — D509 Iron deficiency anemia, unspecified: Secondary | ICD-10-CM | POA: Diagnosis not present

## 2022-01-15 DIAGNOSIS — D631 Anemia in chronic kidney disease: Secondary | ICD-10-CM | POA: Diagnosis not present

## 2022-01-15 DIAGNOSIS — N2581 Secondary hyperparathyroidism of renal origin: Secondary | ICD-10-CM | POA: Diagnosis not present

## 2022-01-15 DIAGNOSIS — Z23 Encounter for immunization: Secondary | ICD-10-CM | POA: Diagnosis not present

## 2022-01-15 DIAGNOSIS — N186 End stage renal disease: Secondary | ICD-10-CM | POA: Diagnosis not present

## 2022-01-16 DIAGNOSIS — N186 End stage renal disease: Secondary | ICD-10-CM | POA: Diagnosis not present

## 2022-01-16 DIAGNOSIS — Z992 Dependence on renal dialysis: Secondary | ICD-10-CM | POA: Diagnosis not present

## 2022-01-17 DIAGNOSIS — D509 Iron deficiency anemia, unspecified: Secondary | ICD-10-CM | POA: Diagnosis not present

## 2022-01-17 DIAGNOSIS — N186 End stage renal disease: Secondary | ICD-10-CM | POA: Diagnosis not present

## 2022-01-17 DIAGNOSIS — N2581 Secondary hyperparathyroidism of renal origin: Secondary | ICD-10-CM | POA: Diagnosis not present

## 2022-01-17 DIAGNOSIS — D631 Anemia in chronic kidney disease: Secondary | ICD-10-CM | POA: Diagnosis not present

## 2022-01-17 DIAGNOSIS — Z992 Dependence on renal dialysis: Secondary | ICD-10-CM | POA: Diagnosis not present

## 2022-01-19 ENCOUNTER — Telehealth: Payer: Self-pay | Admitting: Pulmonary Disease

## 2022-01-19 DIAGNOSIS — N2581 Secondary hyperparathyroidism of renal origin: Secondary | ICD-10-CM | POA: Diagnosis not present

## 2022-01-19 DIAGNOSIS — D509 Iron deficiency anemia, unspecified: Secondary | ICD-10-CM | POA: Diagnosis not present

## 2022-01-19 DIAGNOSIS — Z992 Dependence on renal dialysis: Secondary | ICD-10-CM | POA: Diagnosis not present

## 2022-01-19 DIAGNOSIS — N186 End stage renal disease: Secondary | ICD-10-CM | POA: Diagnosis not present

## 2022-01-19 DIAGNOSIS — D631 Anemia in chronic kidney disease: Secondary | ICD-10-CM | POA: Diagnosis not present

## 2022-01-19 NOTE — Telephone Encounter (Signed)
From Vallarie Mare: was going to call patient again to set up HST noticed she is on O2 now and is using a cpap if she still needs HST she will need a inlab study due to the oxygen  Dr. Halford Chessman please advise

## 2022-01-22 DIAGNOSIS — Z992 Dependence on renal dialysis: Secondary | ICD-10-CM | POA: Diagnosis not present

## 2022-01-22 DIAGNOSIS — N2581 Secondary hyperparathyroidism of renal origin: Secondary | ICD-10-CM | POA: Diagnosis not present

## 2022-01-22 DIAGNOSIS — D631 Anemia in chronic kidney disease: Secondary | ICD-10-CM | POA: Diagnosis not present

## 2022-01-22 DIAGNOSIS — D509 Iron deficiency anemia, unspecified: Secondary | ICD-10-CM | POA: Diagnosis not present

## 2022-01-22 DIAGNOSIS — N186 End stage renal disease: Secondary | ICD-10-CM | POA: Diagnosis not present

## 2022-01-22 NOTE — Telephone Encounter (Signed)
She doesn't need to do any additional sleep testing at this time.  Can cancel home sleep study.

## 2022-01-22 NOTE — Telephone Encounter (Signed)
Noted  

## 2022-01-22 NOTE — Telephone Encounter (Signed)
She doesn't need to do any additional sleep testing at this time.  Can cancel home sleep study.  Order cancelled. ATC patient to notify. No Answer and No VM was available.   Will route to Vallarie Mare so she is aware

## 2022-01-24 DIAGNOSIS — Z992 Dependence on renal dialysis: Secondary | ICD-10-CM | POA: Diagnosis not present

## 2022-01-24 DIAGNOSIS — N2581 Secondary hyperparathyroidism of renal origin: Secondary | ICD-10-CM | POA: Diagnosis not present

## 2022-01-24 DIAGNOSIS — N186 End stage renal disease: Secondary | ICD-10-CM | POA: Diagnosis not present

## 2022-01-24 DIAGNOSIS — D631 Anemia in chronic kidney disease: Secondary | ICD-10-CM | POA: Diagnosis not present

## 2022-01-24 DIAGNOSIS — D509 Iron deficiency anemia, unspecified: Secondary | ICD-10-CM | POA: Diagnosis not present

## 2022-01-26 DIAGNOSIS — D631 Anemia in chronic kidney disease: Secondary | ICD-10-CM | POA: Diagnosis not present

## 2022-01-26 DIAGNOSIS — D509 Iron deficiency anemia, unspecified: Secondary | ICD-10-CM | POA: Diagnosis not present

## 2022-01-26 DIAGNOSIS — Z992 Dependence on renal dialysis: Secondary | ICD-10-CM | POA: Diagnosis not present

## 2022-01-26 DIAGNOSIS — N186 End stage renal disease: Secondary | ICD-10-CM | POA: Diagnosis not present

## 2022-01-26 DIAGNOSIS — N2581 Secondary hyperparathyroidism of renal origin: Secondary | ICD-10-CM | POA: Diagnosis not present

## 2022-01-29 DIAGNOSIS — N2581 Secondary hyperparathyroidism of renal origin: Secondary | ICD-10-CM | POA: Diagnosis not present

## 2022-01-29 DIAGNOSIS — Z992 Dependence on renal dialysis: Secondary | ICD-10-CM | POA: Diagnosis not present

## 2022-01-29 DIAGNOSIS — D631 Anemia in chronic kidney disease: Secondary | ICD-10-CM | POA: Diagnosis not present

## 2022-01-29 DIAGNOSIS — D509 Iron deficiency anemia, unspecified: Secondary | ICD-10-CM | POA: Diagnosis not present

## 2022-01-29 DIAGNOSIS — N186 End stage renal disease: Secondary | ICD-10-CM | POA: Diagnosis not present

## 2022-01-31 DIAGNOSIS — D631 Anemia in chronic kidney disease: Secondary | ICD-10-CM | POA: Diagnosis not present

## 2022-01-31 DIAGNOSIS — N2581 Secondary hyperparathyroidism of renal origin: Secondary | ICD-10-CM | POA: Diagnosis not present

## 2022-01-31 DIAGNOSIS — N186 End stage renal disease: Secondary | ICD-10-CM | POA: Diagnosis not present

## 2022-01-31 DIAGNOSIS — Z992 Dependence on renal dialysis: Secondary | ICD-10-CM | POA: Diagnosis not present

## 2022-01-31 DIAGNOSIS — D509 Iron deficiency anemia, unspecified: Secondary | ICD-10-CM | POA: Diagnosis not present

## 2022-02-02 DIAGNOSIS — Z992 Dependence on renal dialysis: Secondary | ICD-10-CM | POA: Diagnosis not present

## 2022-02-02 DIAGNOSIS — D631 Anemia in chronic kidney disease: Secondary | ICD-10-CM | POA: Diagnosis not present

## 2022-02-02 DIAGNOSIS — N2581 Secondary hyperparathyroidism of renal origin: Secondary | ICD-10-CM | POA: Diagnosis not present

## 2022-02-02 DIAGNOSIS — D509 Iron deficiency anemia, unspecified: Secondary | ICD-10-CM | POA: Diagnosis not present

## 2022-02-02 DIAGNOSIS — N186 End stage renal disease: Secondary | ICD-10-CM | POA: Diagnosis not present

## 2022-02-05 DIAGNOSIS — N2581 Secondary hyperparathyroidism of renal origin: Secondary | ICD-10-CM | POA: Diagnosis not present

## 2022-02-05 DIAGNOSIS — N186 End stage renal disease: Secondary | ICD-10-CM | POA: Diagnosis not present

## 2022-02-05 DIAGNOSIS — Z992 Dependence on renal dialysis: Secondary | ICD-10-CM | POA: Diagnosis not present

## 2022-02-05 DIAGNOSIS — D509 Iron deficiency anemia, unspecified: Secondary | ICD-10-CM | POA: Diagnosis not present

## 2022-02-05 DIAGNOSIS — D631 Anemia in chronic kidney disease: Secondary | ICD-10-CM | POA: Diagnosis not present

## 2022-02-07 DIAGNOSIS — D631 Anemia in chronic kidney disease: Secondary | ICD-10-CM | POA: Diagnosis not present

## 2022-02-07 DIAGNOSIS — Z992 Dependence on renal dialysis: Secondary | ICD-10-CM | POA: Diagnosis not present

## 2022-02-07 DIAGNOSIS — D509 Iron deficiency anemia, unspecified: Secondary | ICD-10-CM | POA: Diagnosis not present

## 2022-02-07 DIAGNOSIS — N2581 Secondary hyperparathyroidism of renal origin: Secondary | ICD-10-CM | POA: Diagnosis not present

## 2022-02-07 DIAGNOSIS — N186 End stage renal disease: Secondary | ICD-10-CM | POA: Diagnosis not present

## 2022-02-08 DIAGNOSIS — Z992 Dependence on renal dialysis: Secondary | ICD-10-CM | POA: Diagnosis not present

## 2022-02-08 DIAGNOSIS — J9611 Chronic respiratory failure with hypoxia: Secondary | ICD-10-CM | POA: Diagnosis not present

## 2022-02-08 DIAGNOSIS — E041 Nontoxic single thyroid nodule: Secondary | ICD-10-CM | POA: Diagnosis not present

## 2022-02-08 DIAGNOSIS — N186 End stage renal disease: Secondary | ICD-10-CM | POA: Diagnosis not present

## 2022-02-09 DIAGNOSIS — N2581 Secondary hyperparathyroidism of renal origin: Secondary | ICD-10-CM | POA: Diagnosis not present

## 2022-02-09 DIAGNOSIS — D631 Anemia in chronic kidney disease: Secondary | ICD-10-CM | POA: Diagnosis not present

## 2022-02-09 DIAGNOSIS — N186 End stage renal disease: Secondary | ICD-10-CM | POA: Diagnosis not present

## 2022-02-09 DIAGNOSIS — Z992 Dependence on renal dialysis: Secondary | ICD-10-CM | POA: Diagnosis not present

## 2022-02-09 DIAGNOSIS — D509 Iron deficiency anemia, unspecified: Secondary | ICD-10-CM | POA: Diagnosis not present

## 2022-02-12 DIAGNOSIS — Z992 Dependence on renal dialysis: Secondary | ICD-10-CM | POA: Diagnosis not present

## 2022-02-12 DIAGNOSIS — D509 Iron deficiency anemia, unspecified: Secondary | ICD-10-CM | POA: Diagnosis not present

## 2022-02-12 DIAGNOSIS — N186 End stage renal disease: Secondary | ICD-10-CM | POA: Diagnosis not present

## 2022-02-12 DIAGNOSIS — D631 Anemia in chronic kidney disease: Secondary | ICD-10-CM | POA: Diagnosis not present

## 2022-02-12 DIAGNOSIS — N2581 Secondary hyperparathyroidism of renal origin: Secondary | ICD-10-CM | POA: Diagnosis not present

## 2022-02-13 DIAGNOSIS — Z992 Dependence on renal dialysis: Secondary | ICD-10-CM | POA: Diagnosis not present

## 2022-02-13 DIAGNOSIS — N186 End stage renal disease: Secondary | ICD-10-CM | POA: Diagnosis not present

## 2022-02-14 ENCOUNTER — Other Ambulatory Visit: Payer: Self-pay | Admitting: General Surgery

## 2022-02-14 DIAGNOSIS — D631 Anemia in chronic kidney disease: Secondary | ICD-10-CM | POA: Diagnosis not present

## 2022-02-14 DIAGNOSIS — Z992 Dependence on renal dialysis: Secondary | ICD-10-CM | POA: Diagnosis not present

## 2022-02-14 DIAGNOSIS — N2581 Secondary hyperparathyroidism of renal origin: Secondary | ICD-10-CM | POA: Diagnosis not present

## 2022-02-14 DIAGNOSIS — N186 End stage renal disease: Secondary | ICD-10-CM | POA: Diagnosis not present

## 2022-02-14 DIAGNOSIS — E041 Nontoxic single thyroid nodule: Secondary | ICD-10-CM

## 2022-02-14 DIAGNOSIS — D509 Iron deficiency anemia, unspecified: Secondary | ICD-10-CM | POA: Diagnosis not present

## 2022-02-16 DIAGNOSIS — N186 End stage renal disease: Secondary | ICD-10-CM | POA: Diagnosis not present

## 2022-02-16 DIAGNOSIS — Z992 Dependence on renal dialysis: Secondary | ICD-10-CM | POA: Diagnosis not present

## 2022-02-16 DIAGNOSIS — D631 Anemia in chronic kidney disease: Secondary | ICD-10-CM | POA: Diagnosis not present

## 2022-02-16 DIAGNOSIS — D509 Iron deficiency anemia, unspecified: Secondary | ICD-10-CM | POA: Diagnosis not present

## 2022-02-16 DIAGNOSIS — N2581 Secondary hyperparathyroidism of renal origin: Secondary | ICD-10-CM | POA: Diagnosis not present

## 2022-02-19 DIAGNOSIS — D509 Iron deficiency anemia, unspecified: Secondary | ICD-10-CM | POA: Diagnosis not present

## 2022-02-19 DIAGNOSIS — N186 End stage renal disease: Secondary | ICD-10-CM | POA: Diagnosis not present

## 2022-02-19 DIAGNOSIS — Z992 Dependence on renal dialysis: Secondary | ICD-10-CM | POA: Diagnosis not present

## 2022-02-19 DIAGNOSIS — N2581 Secondary hyperparathyroidism of renal origin: Secondary | ICD-10-CM | POA: Diagnosis not present

## 2022-02-19 DIAGNOSIS — D631 Anemia in chronic kidney disease: Secondary | ICD-10-CM | POA: Diagnosis not present

## 2022-02-21 DIAGNOSIS — N186 End stage renal disease: Secondary | ICD-10-CM | POA: Diagnosis not present

## 2022-02-21 DIAGNOSIS — D509 Iron deficiency anemia, unspecified: Secondary | ICD-10-CM | POA: Diagnosis not present

## 2022-02-21 DIAGNOSIS — N2581 Secondary hyperparathyroidism of renal origin: Secondary | ICD-10-CM | POA: Diagnosis not present

## 2022-02-21 DIAGNOSIS — D631 Anemia in chronic kidney disease: Secondary | ICD-10-CM | POA: Diagnosis not present

## 2022-02-21 DIAGNOSIS — Z992 Dependence on renal dialysis: Secondary | ICD-10-CM | POA: Diagnosis not present

## 2022-02-23 DIAGNOSIS — N2581 Secondary hyperparathyroidism of renal origin: Secondary | ICD-10-CM | POA: Diagnosis not present

## 2022-02-23 DIAGNOSIS — D509 Iron deficiency anemia, unspecified: Secondary | ICD-10-CM | POA: Diagnosis not present

## 2022-02-23 DIAGNOSIS — D631 Anemia in chronic kidney disease: Secondary | ICD-10-CM | POA: Diagnosis not present

## 2022-02-23 DIAGNOSIS — Z992 Dependence on renal dialysis: Secondary | ICD-10-CM | POA: Diagnosis not present

## 2022-02-23 DIAGNOSIS — N186 End stage renal disease: Secondary | ICD-10-CM | POA: Diagnosis not present

## 2022-02-26 DIAGNOSIS — N186 End stage renal disease: Secondary | ICD-10-CM | POA: Diagnosis not present

## 2022-02-26 DIAGNOSIS — N2581 Secondary hyperparathyroidism of renal origin: Secondary | ICD-10-CM | POA: Diagnosis not present

## 2022-02-26 DIAGNOSIS — D509 Iron deficiency anemia, unspecified: Secondary | ICD-10-CM | POA: Diagnosis not present

## 2022-02-26 DIAGNOSIS — Z992 Dependence on renal dialysis: Secondary | ICD-10-CM | POA: Diagnosis not present

## 2022-02-26 DIAGNOSIS — D631 Anemia in chronic kidney disease: Secondary | ICD-10-CM | POA: Diagnosis not present

## 2022-02-27 ENCOUNTER — Ambulatory Visit: Payer: Medicare Other | Admitting: "Endocrinology

## 2022-02-27 ENCOUNTER — Other Ambulatory Visit (HOSPITAL_COMMUNITY)
Admission: RE | Admit: 2022-02-27 | Discharge: 2022-02-27 | Disposition: A | Discharge status: Home / Self Care | Payer: Medicare Other | Source: Ambulatory Visit | Attending: Interventional Radiology | Admitting: Interventional Radiology

## 2022-02-27 ENCOUNTER — Ambulatory Visit
Admission: RE | Admit: 2022-02-27 | Discharge: 2022-02-27 | Disposition: A | Discharge status: Home / Self Care | Payer: Medicare Other | Source: Ambulatory Visit | Attending: General Surgery | Admitting: General Surgery

## 2022-02-27 DIAGNOSIS — E041 Nontoxic single thyroid nodule: Secondary | ICD-10-CM

## 2022-02-27 DIAGNOSIS — D34 Benign neoplasm of thyroid gland: Secondary | ICD-10-CM | POA: Insufficient documentation

## 2022-02-28 DIAGNOSIS — N2581 Secondary hyperparathyroidism of renal origin: Secondary | ICD-10-CM | POA: Diagnosis not present

## 2022-02-28 DIAGNOSIS — N186 End stage renal disease: Secondary | ICD-10-CM | POA: Diagnosis not present

## 2022-02-28 DIAGNOSIS — Z992 Dependence on renal dialysis: Secondary | ICD-10-CM | POA: Diagnosis not present

## 2022-02-28 DIAGNOSIS — D509 Iron deficiency anemia, unspecified: Secondary | ICD-10-CM | POA: Diagnosis not present

## 2022-02-28 DIAGNOSIS — D631 Anemia in chronic kidney disease: Secondary | ICD-10-CM | POA: Diagnosis not present

## 2022-03-01 LAB — CYTOLOGY - NON PAP

## 2022-03-02 ENCOUNTER — Encounter (HOSPITAL_COMMUNITY): Payer: Self-pay | Admitting: *Deleted

## 2022-03-02 ENCOUNTER — Emergency Department (HOSPITAL_COMMUNITY): Payer: Medicare Other

## 2022-03-02 ENCOUNTER — Emergency Department (HOSPITAL_COMMUNITY)
Admission: EM | Admit: 2022-03-02 | Discharge: 2022-03-02 | Disposition: A | Discharge status: Home / Self Care | Payer: Medicare Other | Attending: Emergency Medicine | Admitting: Emergency Medicine

## 2022-03-02 ENCOUNTER — Other Ambulatory Visit: Payer: Self-pay

## 2022-03-02 ENCOUNTER — Other Ambulatory Visit: Payer: Self-pay | Admitting: Interventional Radiology

## 2022-03-02 DIAGNOSIS — Z7984 Long term (current) use of oral hypoglycemic drugs: Secondary | ICD-10-CM | POA: Diagnosis not present

## 2022-03-02 DIAGNOSIS — R221 Localized swelling, mass and lump, neck: Secondary | ICD-10-CM

## 2022-03-02 DIAGNOSIS — I120 Hypertensive chronic kidney disease with stage 5 chronic kidney disease or end stage renal disease: Secondary | ICD-10-CM | POA: Diagnosis not present

## 2022-03-02 DIAGNOSIS — M542 Cervicalgia: Secondary | ICD-10-CM | POA: Diagnosis not present

## 2022-03-02 DIAGNOSIS — N186 End stage renal disease: Secondary | ICD-10-CM | POA: Diagnosis not present

## 2022-03-02 DIAGNOSIS — D72829 Elevated white blood cell count, unspecified: Secondary | ICD-10-CM | POA: Diagnosis not present

## 2022-03-02 DIAGNOSIS — G8918 Other acute postprocedural pain: Secondary | ICD-10-CM | POA: Insufficient documentation

## 2022-03-02 DIAGNOSIS — Z992 Dependence on renal dialysis: Secondary | ICD-10-CM | POA: Insufficient documentation

## 2022-03-02 DIAGNOSIS — D509 Iron deficiency anemia, unspecified: Secondary | ICD-10-CM | POA: Diagnosis not present

## 2022-03-02 DIAGNOSIS — J341 Cyst and mucocele of nose and nasal sinus: Secondary | ICD-10-CM | POA: Diagnosis not present

## 2022-03-02 DIAGNOSIS — Z79899 Other long term (current) drug therapy: Secondary | ICD-10-CM | POA: Diagnosis not present

## 2022-03-02 DIAGNOSIS — Z7982 Long term (current) use of aspirin: Secondary | ICD-10-CM | POA: Diagnosis not present

## 2022-03-02 DIAGNOSIS — D631 Anemia in chronic kidney disease: Secondary | ICD-10-CM | POA: Diagnosis not present

## 2022-03-02 DIAGNOSIS — N2581 Secondary hyperparathyroidism of renal origin: Secondary | ICD-10-CM | POA: Diagnosis not present

## 2022-03-02 DIAGNOSIS — E1122 Type 2 diabetes mellitus with diabetic chronic kidney disease: Secondary | ICD-10-CM | POA: Insufficient documentation

## 2022-03-02 DIAGNOSIS — R220 Localized swelling, mass and lump, head: Secondary | ICD-10-CM | POA: Diagnosis not present

## 2022-03-02 DIAGNOSIS — I898 Other specified noninfective disorders of lymphatic vessels and lymph nodes: Secondary | ICD-10-CM | POA: Diagnosis not present

## 2022-03-02 LAB — CBC WITH DIFFERENTIAL/PLATELET
Abs Immature Granulocytes: 0.06 10*3/uL (ref 0.00–0.07)
Basophils Absolute: 0.1 10*3/uL (ref 0.0–0.1)
Basophils Relative: 1 %
Eosinophils Absolute: 0.2 10*3/uL (ref 0.0–0.5)
Eosinophils Relative: 1 %
HCT: 42.7 % (ref 36.0–46.0)
Hemoglobin: 13.2 g/dL (ref 12.0–15.0)
Immature Granulocytes: 1 %
Lymphocytes Relative: 15 %
Lymphs Abs: 1.8 10*3/uL (ref 0.7–4.0)
MCH: 27.8 pg (ref 26.0–34.0)
MCHC: 30.9 g/dL (ref 30.0–36.0)
MCV: 89.9 fL (ref 80.0–100.0)
Monocytes Absolute: 0.6 10*3/uL (ref 0.1–1.0)
Monocytes Relative: 5 %
Neutro Abs: 9.2 10*3/uL — ABNORMAL HIGH (ref 1.7–7.7)
Neutrophils Relative %: 77 %
Platelets: 325 10*3/uL (ref 150–400)
RBC: 4.75 MIL/uL (ref 3.87–5.11)
RDW: 15.8 % — ABNORMAL HIGH (ref 11.5–15.5)
WBC: 11.9 10*3/uL — ABNORMAL HIGH (ref 4.0–10.5)
nRBC: 0 % (ref 0.0–0.2)

## 2022-03-02 LAB — BASIC METABOLIC PANEL
Anion gap: 16 — ABNORMAL HIGH (ref 5–15)
BUN: 31 mg/dL — ABNORMAL HIGH (ref 6–20)
CO2: 26 mmol/L (ref 22–32)
Calcium: 9.8 mg/dL (ref 8.9–10.3)
Chloride: 97 mmol/L — ABNORMAL LOW (ref 98–111)
Creatinine, Ser: 7.01 mg/dL — ABNORMAL HIGH (ref 0.44–1.00)
GFR, Estimated: 7 mL/min — ABNORMAL LOW (ref >60)
Glucose, Bld: 239 mg/dL — ABNORMAL HIGH (ref 70–99)
Potassium: 3.9 mmol/L (ref 3.5–5.1)
Sodium: 139 mmol/L (ref 135–145)

## 2022-03-02 MED ORDER — HYDROCODONE-ACETAMINOPHEN 5-325 MG PO TABS: 1.0000 | ORAL_TABLET | Freq: Four times a day (QID) | ORAL | 0 refills | Status: DC | PRN

## 2022-03-02 MED ORDER — IOHEXOL 300 MG/ML  SOLN
75.0000 mL | Freq: Once | INTRAMUSCULAR | Status: AC | PRN
  Administered 2022-03-02: 75 mL via INTRAVENOUS

## 2022-03-02 MED ORDER — FENTANYL CITRATE PF 50 MCG/ML IJ SOSY
50.0000 ug | PREFILLED_SYRINGE | Freq: Once | INTRAMUSCULAR | Status: AC
  Administered 2022-03-02: 50 ug via INTRAVENOUS
  Filled 2022-03-02: qty 1

## 2022-03-02 MED ORDER — ONDANSETRON HCL 4 MG/2ML IJ SOLN
4.0000 mg | Freq: Once | INTRAMUSCULAR | Status: AC
  Administered 2022-03-02: 4 mg via INTRAVENOUS
  Filled 2022-03-02: qty 2

## 2022-03-02 NOTE — ED Triage Notes (Signed)
Pt c/o right sided facial swelling and pain that started Wednesday night. Pt reports she had a biopsy done on Tuesday. Pt is a dialysis pt and last had dialysis this morning.  ?

## 2022-03-02 NOTE — ED Notes (Signed)
Pt wheeled to waiting room. Pt verbalized understanding of discharge instructions.   

## 2022-03-02 NOTE — ED Provider Notes (Signed)
?[facility] ?Provider Note ? ? ?CSN: [id number] ?Arrival date & time: 03/02/22  1029 ? ?  ? ?History ? ?Chief Complaint  ?Patient presents with  ? Facial Swelling  ? ? ?Patricia [name] is a 51 y.o. female with a history significant for type 2 diabetes, end-stage renal disease on dialysis since 2016, hypertension, GERD and a recent thyroid nodule who underwent ultrasound-guided biopsy 3 days ago presenting for evaluation of pain and swelling around the biopsy site which continues to worsen.  She denies fevers or chills, denies shortness of breath or feeling like her throat is swelling, her pain is worsened with swallowing however.  Her pain radiates into her right ear region.  She denies any mouth pain or swelling.  She has taken both ibuprofen and Tylenol, the last dose of Tylenol just prior to arrival while at dialysis.  Neither these medications have improved her pain. ? ?The history is provided by the patient.  ? ?  ? ?Home Medications ?Prior to Admission medications   ?Medication Sig Start Date End Date Taking? Authorizing Provider  ?HYDROcodone-acetaminophen (NORCO/VICODIN) 5-325 MG tablet Take 1 tablet by mouth every 6 (six) hours as needed. 03/02/22  Yes Elsbeth Yearick, Almyra Free, PA-C  ?acetaminophen (TYLENOL) 325 MG tablet Take 650 mg by mouth every 6 (six) hours as needed.    [provider]  ?albuterol (PROVENTIL) (2.5 MG/3ML) 0.083% nebulizer solution Take 3 mLs (2.5 mg total) by nebulization every 6 (six) hours as needed for wheezing or shortness of breath. 11/28/17   Kathie Dike, MD  ?aspirin EC 81 MG tablet Take 1 tablet (81 mg total) by mouth daily. Swallow whole. 12/07/21   Fay Records, MD  ?cinacalcet (SENSIPAR) 60 MG tablet Take 30 mg by mouth daily with breakfast.    [provider]  ?dicyclomine (BENTYL) 10 MG capsule Take 10 mg by mouth 2 (two) times daily as needed. 10/06/19   [provider]  ?ferric citrate (AURYXIA) 1 GM 210 MG(Fe) tablet Take 2 tablets by  mouth 2 (two) times daily with a meal. With Breakfast & with supper    [provider]  ?fluticasone (FLONASE) 50 MCG/ACT nasal spray Place 2 sprays into both nostrils 2 (two) times daily.    [provider]  ?glipiZIDE (GLUCOTROL) 5 MG tablet Take 10 mg by mouth 2 (two) times daily as needed. For blood sugar levels over 150    [provider]  ?ipratropium (ATROVENT) 0.03 % nasal spray Place 2 sprays into both nostrils 2 (two) times daily.  06/06/19   [provider]  ?labetalol (NORMODYNE) 100 MG tablet Take 100 mg by mouth 2 (two) times daily.    [provider]  ?linaclotide (LINZESS) 290 MCG CAPS capsule Take 290 mcg by mouth daily before breakfast.    [provider]  ?montelukast (SINGULAIR) 10 MG tablet Take 1 tablet (10 mg total) by mouth at bedtime. 07/12/21   Freddi Starr, MD  ?multivitamin (RENA-VIT) TABS tablet Take 1 tablet by mouth daily.    [provider]  ?NIFEdipine (ADALAT CC) 60 MG 24 hr tablet Take 60 mg by mouth daily.    [provider]  ?ondansetron (ZOFRAN-ODT) 4 MG disintegrating tablet Take 4 mg by mouth as needed for nausea or vomiting.     [provider]  ?pantoprazole (PROTONIX) 40 MG tablet TAKE 1 TABLET (40 MG TOTAL) BY MOUTH DAILY. [place] 12/25/21   Annitta Needs, NP  ?rOPINIRole (REQUIP XL)  2 MG 24 hr tablet Take 2 mg by mouth at bedtime.  09/10/16   [provider]  ?rosuvastatin (CRESTOR) 10 MG tablet Take 1 tablet (10 mg total) by mouth daily. 01/08/22   Fay Records, MD  ?sevelamer carbonate (RENVELA) 800 MG tablet Take 800 mg by mouth See admin instructions. Take 2 tablets (1600 mg) in the morning, take 3 tablets (2400 mg) with lunch or snack,  and take 2 tablets (1600 mg) by mouth in the evening with dinner meal.    [provider]  ?sodium chloride (OCEAN) 0.65 % SOLN nasal spray Place 1 spray into both nostrils as needed for congestion. 01/02/20    Hongalgi, Lenis Dickinson, MD  ?   ? ?Allergies    ?Amlodipine besylate and Reglan [metoclopramide]   ? ?Review of Systems   ?Review of Systems  ?Constitutional:  Negative for chills and fever.  ?HENT:  Positive for trouble swallowing. Negative for congestion, sore throat and voice change.   ?Eyes: Negative.   ?Respiratory:  Negative for chest tightness and shortness of breath.   ?Cardiovascular:  Negative for chest pain.  ?Gastrointestinal:  Negative for abdominal pain, nausea and vomiting.  ?Genitourinary: Negative.   ?Musculoskeletal:  Negative for arthralgias and joint swelling.  ?Skin: Negative.  Negative for rash and wound.  ?Neurological:  Negative for dizziness, weakness, light-headedness, numbness and headaches.  ?Psychiatric/Behavioral: Negative.    ?All other systems reviewed and are negative. ? ?Physical Exam ?Updated Vital Signs ?BP 119/67   Pulse 88   Temp 98.4 ?F (36.9 ?C) (Oral)   Resp 18   Ht 5\' 3"  (1.6 m)   Wt 110.2 kg   SpO2 98%   BMI 43.05 kg/m?  ?Physical Exam ?Constitutional:   ?   General: She is not in acute distress. ?   Appearance: She is well-developed.  ?HENT:  ?   Head: Normocephalic and atraumatic.  ?   Jaw: No trismus.  ?   Right Ear: Tympanic membrane and external ear normal.  ?   Left Ear: Tympanic membrane and external ear normal.  ?   Mouth/Throat:  ?   Mouth: Mucous membranes are moist. No oral lesions.  ?   Dentition: No gingival swelling or dental abscesses.  ?   Pharynx: Oropharynx is clear. Uvula swelling present. No pharyngeal swelling, oropharyngeal exudate or posterior oropharyngeal erythema.  ?   Comments: Edentulous lower ?Eyes:  ?   Conjunctiva/sclera: Conjunctivae normal.  ?Neck:  ?   Thyroid: Thyroid tenderness present.  ?   Trachea: Trachea normal.  ?   Comments: Ttp right neck at right thyroid radiating to right mandible angle.  Mild edema, no erythema, induration or fluctuance appreciated. ?Cardiovascular:  ?   Rate and Rhythm: Normal rate.  ?   Heart sounds: Normal  heart sounds.  ?Pulmonary:  ?   Effort: Pulmonary effort is normal.  ?   Breath sounds: No wheezing or rhonchi.  ?   Comments: No chest crepitus ?Musculoskeletal:     ?   General: Normal range of motion.  ?   Cervical back: Normal range of motion and neck supple.  ?Lymphadenopathy:  ?   Cervical: No cervical adenopathy.  ?Skin: ?   General: Skin is warm and dry.  ?   Findings: No erythema.  ?Neurological:  ?   Mental Status: She is alert and oriented to person, place, and time.  ? ? ?ED Results / Procedures / Treatments   ?Labs ?(all labs ordered are  listed, but only abnormal results are displayed) ?Labs Reviewed  ?CBC WITH DIFFERENTIAL/PLATELET - Abnormal; Notable for the following components:  ?    Result Value  ? WBC 11.9 (*)   ? RDW 15.8 (*)   ? Neutro Abs 9.2 (*)   ? All other components within normal limits  ?BASIC METABOLIC PANEL - Abnormal; Notable for the following components:  ? Chloride 97 (*)   ? Glucose, Bld 239 (*)   ? BUN 31 (*)   ? Creatinine, Ser 7.01 (*)   ? GFR, Estimated 7 (*)   ? Anion gap 16 (*)   ? All other components within normal limits  ? ? ?EKG ?None ? ?Radiology ?CT Soft Tissue Neck W Contrast ? ?Result Date: 03/02/2022 ?CLINICAL DATA:  post thyroid biopsy pain and swelling EXAM: CT NECK WITH CONTRAST TECHNIQUE: Multidetector CT imaging of the neck was performed using the standard protocol following the bolus administration of intravenous contrast. RADIATION DOSE REDUCTION: This exam was performed according to the departmental dose-optimization program which includes automated exposure control, adjustment of the mA and/or kV according to patient size and/or use of iterative reconstruction technique. CONTRAST:  80mL OMNIPAQUE IOHEXOL 300 MG/ML  SOLN COMPARISON:  None. FINDINGS: Pharynx and larynx: The evaluation of the larynx is limited due to closed glottis. Within this limitation, no evidence of a mass or edema. Salivary glands: No inflammation, mass, or stone. Thyroid: Approximately 4.0  x 3.0 x 4.4 cm mass along the posterior right aspect of the thyroid, further evaluated on recent ultrasound and recently biopsy. This mass displaces the adjacent common carotid artery to the right. Mild displaceme

## 2022-03-02 NOTE — Discharge Instructions (Signed)
As discussed, your CT scan is stable, there is no sign of complications from your recent thyroid biopsy.  I recommend continued ice therapy applied to the site if you have continued pain.  You have been prescribed a small quantity of hydrocodone to help you with pain relief.  Use caution with this medication as it will make you drowsy.  Do not drive within 4 hours of taking it.  Get rechecked immediately by returning here for any worsening pain, swelling, shortness of breath or difficulty swallowing. ?

## 2022-03-05 DIAGNOSIS — N2581 Secondary hyperparathyroidism of renal origin: Secondary | ICD-10-CM | POA: Diagnosis not present

## 2022-03-05 DIAGNOSIS — D509 Iron deficiency anemia, unspecified: Secondary | ICD-10-CM | POA: Diagnosis not present

## 2022-03-05 DIAGNOSIS — Z992 Dependence on renal dialysis: Secondary | ICD-10-CM | POA: Diagnosis not present

## 2022-03-05 DIAGNOSIS — N186 End stage renal disease: Secondary | ICD-10-CM | POA: Diagnosis not present

## 2022-03-05 DIAGNOSIS — D631 Anemia in chronic kidney disease: Secondary | ICD-10-CM | POA: Diagnosis not present

## 2022-03-07 DIAGNOSIS — D509 Iron deficiency anemia, unspecified: Secondary | ICD-10-CM | POA: Diagnosis not present

## 2022-03-07 DIAGNOSIS — N2581 Secondary hyperparathyroidism of renal origin: Secondary | ICD-10-CM | POA: Diagnosis not present

## 2022-03-07 DIAGNOSIS — C50919 Malignant neoplasm of unspecified site of unspecified female breast: Secondary | ICD-10-CM | POA: Diagnosis not present

## 2022-03-07 DIAGNOSIS — E041 Nontoxic single thyroid nodule: Secondary | ICD-10-CM | POA: Diagnosis not present

## 2022-03-07 DIAGNOSIS — D631 Anemia in chronic kidney disease: Secondary | ICD-10-CM | POA: Diagnosis not present

## 2022-03-07 DIAGNOSIS — Z992 Dependence on renal dialysis: Secondary | ICD-10-CM | POA: Diagnosis not present

## 2022-03-07 DIAGNOSIS — Z6841 Body Mass Index (BMI) 40.0 and over, adult: Secondary | ICD-10-CM | POA: Diagnosis not present

## 2022-03-07 DIAGNOSIS — N186 End stage renal disease: Secondary | ICD-10-CM | POA: Diagnosis not present

## 2022-03-08 ENCOUNTER — Other Ambulatory Visit: Payer: Self-pay | Admitting: Physician Assistant

## 2022-03-08 ENCOUNTER — Other Ambulatory Visit: Payer: Medicare Other

## 2022-03-08 ENCOUNTER — Other Ambulatory Visit (HOSPITAL_COMMUNITY): Payer: Self-pay | Admitting: Physician Assistant

## 2022-03-08 DIAGNOSIS — E041 Nontoxic single thyroid nodule: Secondary | ICD-10-CM

## 2022-03-08 DIAGNOSIS — C50919 Malignant neoplasm of unspecified site of unspecified female breast: Secondary | ICD-10-CM

## 2022-03-09 DIAGNOSIS — D631 Anemia in chronic kidney disease: Secondary | ICD-10-CM | POA: Diagnosis not present

## 2022-03-09 DIAGNOSIS — N2581 Secondary hyperparathyroidism of renal origin: Secondary | ICD-10-CM | POA: Diagnosis not present

## 2022-03-09 DIAGNOSIS — Z992 Dependence on renal dialysis: Secondary | ICD-10-CM | POA: Diagnosis not present

## 2022-03-09 DIAGNOSIS — N186 End stage renal disease: Secondary | ICD-10-CM | POA: Diagnosis not present

## 2022-03-09 DIAGNOSIS — D509 Iron deficiency anemia, unspecified: Secondary | ICD-10-CM | POA: Diagnosis not present

## 2022-03-12 DIAGNOSIS — N186 End stage renal disease: Secondary | ICD-10-CM | POA: Diagnosis not present

## 2022-03-12 DIAGNOSIS — Z992 Dependence on renal dialysis: Secondary | ICD-10-CM | POA: Diagnosis not present

## 2022-03-12 DIAGNOSIS — N2581 Secondary hyperparathyroidism of renal origin: Secondary | ICD-10-CM | POA: Diagnosis not present

## 2022-03-12 DIAGNOSIS — D509 Iron deficiency anemia, unspecified: Secondary | ICD-10-CM | POA: Diagnosis not present

## 2022-03-12 DIAGNOSIS — D631 Anemia in chronic kidney disease: Secondary | ICD-10-CM | POA: Diagnosis not present

## 2022-03-14 DIAGNOSIS — D509 Iron deficiency anemia, unspecified: Secondary | ICD-10-CM | POA: Diagnosis not present

## 2022-03-14 DIAGNOSIS — N186 End stage renal disease: Secondary | ICD-10-CM | POA: Diagnosis not present

## 2022-03-14 DIAGNOSIS — D631 Anemia in chronic kidney disease: Secondary | ICD-10-CM | POA: Diagnosis not present

## 2022-03-14 DIAGNOSIS — Z992 Dependence on renal dialysis: Secondary | ICD-10-CM | POA: Diagnosis not present

## 2022-03-14 DIAGNOSIS — N2581 Secondary hyperparathyroidism of renal origin: Secondary | ICD-10-CM | POA: Diagnosis not present

## 2022-03-16 DIAGNOSIS — I10 Essential (primary) hypertension: Secondary | ICD-10-CM | POA: Diagnosis not present

## 2022-03-16 DIAGNOSIS — D631 Anemia in chronic kidney disease: Secondary | ICD-10-CM | POA: Diagnosis not present

## 2022-03-16 DIAGNOSIS — E782 Mixed hyperlipidemia: Secondary | ICD-10-CM | POA: Diagnosis not present

## 2022-03-16 DIAGNOSIS — Z992 Dependence on renal dialysis: Secondary | ICD-10-CM | POA: Diagnosis not present

## 2022-03-16 DIAGNOSIS — N2581 Secondary hyperparathyroidism of renal origin: Secondary | ICD-10-CM | POA: Diagnosis not present

## 2022-03-16 DIAGNOSIS — D509 Iron deficiency anemia, unspecified: Secondary | ICD-10-CM | POA: Diagnosis not present

## 2022-03-16 DIAGNOSIS — N186 End stage renal disease: Secondary | ICD-10-CM | POA: Diagnosis not present

## 2022-03-17 DIAGNOSIS — Z1159 Encounter for screening for other viral diseases: Secondary | ICD-10-CM | POA: Diagnosis not present

## 2022-03-17 DIAGNOSIS — N186 End stage renal disease: Secondary | ICD-10-CM | POA: Diagnosis not present

## 2022-03-17 DIAGNOSIS — D631 Anemia in chronic kidney disease: Secondary | ICD-10-CM | POA: Diagnosis not present

## 2022-03-17 DIAGNOSIS — N2581 Secondary hyperparathyroidism of renal origin: Secondary | ICD-10-CM | POA: Diagnosis not present

## 2022-03-17 DIAGNOSIS — N250 Renal osteodystrophy: Secondary | ICD-10-CM | POA: Diagnosis not present

## 2022-03-17 DIAGNOSIS — Z992 Dependence on renal dialysis: Secondary | ICD-10-CM | POA: Diagnosis not present

## 2022-03-17 DIAGNOSIS — D509 Iron deficiency anemia, unspecified: Secondary | ICD-10-CM | POA: Diagnosis not present

## 2022-03-19 DIAGNOSIS — D509 Iron deficiency anemia, unspecified: Secondary | ICD-10-CM | POA: Diagnosis not present

## 2022-03-19 DIAGNOSIS — N186 End stage renal disease: Secondary | ICD-10-CM | POA: Diagnosis not present

## 2022-03-19 DIAGNOSIS — Z992 Dependence on renal dialysis: Secondary | ICD-10-CM | POA: Diagnosis not present

## 2022-03-19 DIAGNOSIS — N2581 Secondary hyperparathyroidism of renal origin: Secondary | ICD-10-CM | POA: Diagnosis not present

## 2022-03-19 DIAGNOSIS — D631 Anemia in chronic kidney disease: Secondary | ICD-10-CM | POA: Diagnosis not present

## 2022-03-19 DIAGNOSIS — N250 Renal osteodystrophy: Secondary | ICD-10-CM | POA: Diagnosis not present

## 2022-03-20 ENCOUNTER — Telehealth (INDEPENDENT_AMBULATORY_CARE_PROVIDER_SITE_OTHER): Payer: Medicare Other | Admitting: Gastroenterology

## 2022-03-20 ENCOUNTER — Telehealth: Payer: Self-pay | Admitting: Gastroenterology

## 2022-03-20 ENCOUNTER — Telehealth: Payer: Self-pay | Admitting: *Deleted

## 2022-03-20 VITALS — Ht 63.0 in | Wt 234.0 lb

## 2022-03-20 DIAGNOSIS — K219 Gastro-esophageal reflux disease without esophagitis: Secondary | ICD-10-CM

## 2022-03-20 DIAGNOSIS — K5900 Constipation, unspecified: Secondary | ICD-10-CM

## 2022-03-20 MED ORDER — PANTOPRAZOLE SODIUM 40 MG PO TBEC: 40.0000 mg | DELAYED_RELEASE_TABLET | Freq: Every day | ORAL | 3 refills | Status: DC

## 2022-03-20 MED ORDER — DICYCLOMINE HCL 10 MG PO CAPS: 10.0000 mg | ORAL_CAPSULE | Freq: Two times a day (BID) | ORAL | 5 refills | Status: DC | PRN

## 2022-03-20 NOTE — Patient Instructions (Signed)
We will have samples for you at the office of [facility]. It will be the lower dosage, so take 2 of them daily until you get your shipment. (Take 2 Linzess 145 micrograms daily).  ? ?I have refilled pantoprazole and dicyclomine! ? ?We will see you back in 3 months! ? ? ?I enjoyed seeing you again today! As you know, I value our relationship and want to provide genuine, compassionate, and quality care. I welcome your feedback. If you receive a survey regarding your visit,  I greatly appreciate you taking time to fill this out. See you next time! ? ?Annitta Needs, PhD, ANP-BC ?[facility]  ? ?

## 2022-03-20 NOTE — Telephone Encounter (Signed)
Pt consented to a virtual visit. 

## 2022-03-20 NOTE — Telephone Encounter (Signed)
Patricia [name], you are scheduled for a virtual visit with your provider today.  Just as we do with appointments in the office, we must obtain your consent to participate.  Your consent will be active for this visit and any virtual visit you may have with one of our providers in the next 365 days.  If you have a MyChart account, I can also send a copy of this consent to you electronically.  All virtual visits are billed to your insurance company just like a traditional visit in the office.  As this is a virtual visit, video technology does not allow for your provider to perform a traditional examination.  This may limit your provider's ability to fully assess your condition.  If your provider identifies any concerns that need to be evaluated in person or the need to arrange testing such as labs, EKG, etc, we will make arrangements to do so.  Although advances in technology are sophisticated, we cannot ensure that it will always work on either your end or our end.  If the connection with a video visit is poor, we may have to switch to a telephone visit.  With either a video or telephone visit, we are not always able to ensure that we have a secure connection.   I need to obtain your verbal consent now.   Are you willing to proceed with your visit today?  ?

## 2022-03-20 NOTE — Progress Notes (Signed)
? ? ?Primary Care Physician:  Jake Samples, PA-C  ?Primary GI: Dr. Gala Romney ? ?Patient Location: Home ?  ?Provider Location: [place] office ?  ?Reason for Visit: Follow-up ?  ?Persons present on the virtual encounter, with roles: Patient and NP ?  ?Total time (minutes) spent on medical discussion: 10 minutes ?  ?Due to COVID-19, visit was conducted using virtual method.  Visit was requested by patient. ? ?Virtual Visit via MyChart Video Note ?Due to COVID-19, visit is conducted virtually and was requested by patient.  ? ?I connected with Patricia [name] on 03/20/22 at  9:00 AM EDT by video and verified that I am speaking with the correct person using two identifiers. ?  ?I discussed the limitations, risks, security and privacy concerns of performing an evaluation and management service by video and the availability of in person appointments. I also discussed with the patient that there may be a patient responsible charge related to this service. The patient expressed understanding and agreed to proceed. ? ?Chief Complaint  ?Patient presents with  ? Follow-up  ?  Constipation, pt was approved for her Linzess but not sent to her know.  ? ? ? ?History of Present Illness: ?Patricia [name] is a pleasant 51 year old female with history of constipation that has been difficult to manage, previously trying Trulance, Amitiza, and Linzess. Linzess has worked best and now patient assistance has been obtained. Chronic GERD. Miralax doesn't work well for patient. On dialysis. On fluid restriction ? ?Constipation: approved for Linzess 290 mcg but awaiting shipment. She ran out of Linzess and needs samples to help her get through this.  ? ?GERD: does very well on pantoprazole daily. No dysphagia.  ? ?No overt GI bleeding. Airlifted to [place] about a month ago. Was traveling to [place] but ran out of oxygen. Uses oxygen as needed. Recently had FNA of thyroid but states all turned out well. From a GI standpoint, she is stable.  ? ?Past  Medical History:  ?Diagnosis Date  ? Anemia   ? Ankle fracture   ? Arthritis   ? Blood transfusion without reported diagnosis   ? Breast cancer ([place])   ? Cancer [facility])   ? Diabetes mellitus without complication ([place])   ? Dialysis patient [facility])   ? mon, wed, friday,   ? End stage renal disease on dialysis [facility])   ? M/W/F [facility] in [place]  ? GERD (gastroesophageal reflux disease)   ? Hypertension   ? OSA (obstructive sleep apnea)   ? uses CPAP sometimes  ? Pneumonia   ? PONV (postoperative nausea and vomiting)   ? Wears glasses   ? ? ? ?Past Surgical History:  ?Procedure Laterality Date  ? ABDOMINAL HYSTERECTOMY    ? AV FISTULA PLACEMENT  11/2014  ? at [place] N/A 04/2021  ? BALLOON DILATION N/A 07/10/2016  ? Procedure: BALLOON DILATION;  Surgeon: Danie Binder, MD;  [facility];  Service: Endoscopy;  Laterality: N/A;  Pyloric dilation  ? BREAST LUMPECTOMY    ? CESAREAN SECTION    ? CHOLECYSTECTOMY    ? COLONOSCOPY WITH PROPOFOL N/A 09/27/2016  ? Dr. Gala Romney: Internal hemorrhoids repeat colonoscopy in 10 years  ? DILATION AND CURETTAGE OF UTERUS    ? ESOPHAGOGASTRODUODENOSCOPY N/A 07/10/2016  ? Dr.Fields- normal esophagus, gastric stenosis was found at the pylorus, gastritis on bx, normal examined duodenun  ? EXTERNAL FIXATION REMOVAL Right 10/29/2018  ? Procedure: REMOVAL RIGHT ANKLE BIOMET ZIMMER EXTERNAL  FIXATOR, SHORT LEG CAST APPLICATION;  Surgeon: Marybelle Killings, MD;  Location: [place];  Service: Orthopedics;  Laterality: Right;  ? MASTECTOMY    ? left sided  ? ORIF ANKLE FRACTURE Right 10/06/2018  ? Procedure: OPEN REDUCTION INTERNAL FIXATION (ORIF) RIGHT ANKLE TRIMALLEOLAR;  Surgeon: Marybelle Killings, MD;  Location: [place];  Service: Orthopedics;  Laterality: Right;  ? ? ? ?Current Meds  ?Medication Sig  ? acetaminophen (TYLENOL) 325 MG tablet Take 650 mg by mouth every 6 (six) hours as needed.  ? albuterol (PROVENTIL) (2.5 MG/3ML) 0.083% nebulizer solution Take 3 mLs (2.5 mg  total) by nebulization every 6 (six) hours as needed for wheezing or shortness of breath.  ? aspirin EC 81 MG tablet Take 1 tablet (81 mg total) by mouth daily. Swallow whole.  ? cinacalcet (SENSIPAR) 60 MG tablet Take 30 mg by mouth daily with breakfast.  ? dicyclomine (BENTYL) 10 MG capsule Take 10 mg by mouth 2 (two) times daily as needed.  ? ferric citrate (AURYXIA) 1 GM 210 MG(Fe) tablet Take 2 tablets by mouth 2 (two) times daily with a meal. With Breakfast & with supper  ? glipiZIDE (GLUCOTROL) 5 MG tablet Take 10 mg by mouth 2 (two) times daily as needed. For blood sugar levels over 150  ? ipratropium (ATROVENT) 0.03 % nasal spray Place 2 sprays into both nostrils 2 (two) times daily.   ? labetalol (NORMODYNE) 100 MG tablet Take 100 mg by mouth 2 (two) times daily.  ? linaclotide (LINZESS) 290 MCG CAPS capsule Take 290 mcg by mouth daily before breakfast.  ? montelukast (SINGULAIR) 10 MG tablet Take 1 tablet (10 mg total) by mouth at bedtime.  ? multivitamin (RENA-VIT) TABS tablet Take 1 tablet by mouth daily.  ? NIFEdipine (ADALAT CC) 60 MG 24 hr tablet Take 60 mg by mouth daily.  ? ondansetron (ZOFRAN-ODT) 4 MG disintegrating tablet Take 4 mg by mouth as needed for nausea or vomiting.   ? pantoprazole (PROTONIX) 40 MG tablet TAKE 1 TABLET (40 MG TOTAL) BY MOUTH DAILY. 30 MINUTES BEFORE BREAKFAST  ? rOPINIRole (REQUIP XL) 2 MG 24 hr tablet Take 2 mg by mouth at bedtime.   ? rosuvastatin (CRESTOR) 10 MG tablet Take 1 tablet (10 mg total) by mouth daily.  ? sevelamer carbonate (RENVELA) 800 MG tablet Take 800 mg by mouth See admin instructions. Take 2 tablets (1600 mg) in the morning, take 3 tablets (2400 mg) with lunch or snack,  and take 2 tablets (1600 mg) by mouth in the evening with dinner meal.  ? sodium chloride (OCEAN) 0.65 % SOLN nasal spray Place 1 spray into both nostrils as needed for congestion.  ?  ? ?Family History  ?Problem Relation Age of Onset  ? Diabetes Mellitus II Mother   ? Hypertension  Mother   ? Hypertension Sister   ? Hypertension Sister   ? Colon cancer Neg Hx   ? ? ?Social History  ? ?Socioeconomic History  ? Marital status: Widowed  ?  Spouse name: Not on file  ? Number of children: Not on file  ? Years of education: Not on file  ? Highest education level: Not on file  ?Occupational History  ? Not on file  ?Tobacco Use  ? Smoking status: Never  ? Smokeless tobacco: Never  ?Vaping Use  ? Vaping Use: Never used  ?Substance and Sexual Activity  ? Alcohol use: No  ?  Alcohol/week: 0.0 standard drinks  ? Drug use:  No  ? Sexual activity: Not on file  ?Other Topics Concern  ? Not on file  ?Social History Narrative  ? Not on file  ? ?Social Determinants of Health  ? ?Financial Resource Strain: Not on file  ?Food Insecurity: Not on file  ?Transportation Needs: Not on file  ?Physical Activity: Not on file  ?Stress: Not on file  ?Social Connections: Not on file  ? ? ? ? ? Review of Systems: ?Gen: Denies fever, chills, anorexia. Denies fatigue, weakness, weight loss.  ?CV: Denies chest pain, palpitations, syncope, peripheral edema, and claudication. ?Resp: Denies dyspnea at rest, cough, wheezing, coughing up blood, and pleurisy. ?GI: see HPI ?Derm: Denies rash, itching, dry skin ?Psych: Denies depression, anxiety, memory loss, confusion. No homicidal or suicidal ideation.  ?Heme: Denies bruising, bleeding, and enlarged lymph nodes. ? ?Observations/Objective: ?No distress. Unable to perform physical exam due to video encounter.  ? ?Assessment and Plan: ?51 year old female with history of chronic constipation and GERD presenting in video follow-up today. ? ?Constipation is fairly well managed on Linzess 290 mcg. She has been approved by patient assistance and awaiting shipment. The office does not have samples of 290 mcg, so we will provide short term 145 mcg samples to take twice a day.  ? ?GERD is well controlled on pantoprazole daily without alarm signs/symptoms. ? ?We will see her back in 3 months.   ? ?Follow Up Instructions: ? ?  ?I discussed the assessment and treatment plan with the patient. The patient was provided an opportunity to ask questions and all were answered. The patient agreed with the

## 2022-03-20 NOTE — Telephone Encounter (Signed)
Patricia [name], needs 3 month follow-up. Thanks! ?

## 2022-03-21 DIAGNOSIS — N250 Renal osteodystrophy: Secondary | ICD-10-CM | POA: Diagnosis not present

## 2022-03-21 DIAGNOSIS — Z992 Dependence on renal dialysis: Secondary | ICD-10-CM | POA: Diagnosis not present

## 2022-03-21 DIAGNOSIS — D631 Anemia in chronic kidney disease: Secondary | ICD-10-CM | POA: Diagnosis not present

## 2022-03-21 DIAGNOSIS — D509 Iron deficiency anemia, unspecified: Secondary | ICD-10-CM | POA: Diagnosis not present

## 2022-03-21 DIAGNOSIS — N186 End stage renal disease: Secondary | ICD-10-CM | POA: Diagnosis not present

## 2022-03-21 DIAGNOSIS — N2581 Secondary hyperparathyroidism of renal origin: Secondary | ICD-10-CM | POA: Diagnosis not present

## 2022-03-23 DIAGNOSIS — N186 End stage renal disease: Secondary | ICD-10-CM | POA: Diagnosis not present

## 2022-03-23 DIAGNOSIS — N250 Renal osteodystrophy: Secondary | ICD-10-CM | POA: Diagnosis not present

## 2022-03-23 DIAGNOSIS — Z992 Dependence on renal dialysis: Secondary | ICD-10-CM | POA: Diagnosis not present

## 2022-03-23 DIAGNOSIS — D631 Anemia in chronic kidney disease: Secondary | ICD-10-CM | POA: Diagnosis not present

## 2022-03-23 DIAGNOSIS — D509 Iron deficiency anemia, unspecified: Secondary | ICD-10-CM | POA: Diagnosis not present

## 2022-03-23 DIAGNOSIS — N2581 Secondary hyperparathyroidism of renal origin: Secondary | ICD-10-CM | POA: Diagnosis not present

## 2022-03-26 ENCOUNTER — Ambulatory Visit (HOSPITAL_COMMUNITY)
Admission: RE | Admit: 2022-03-26 | Discharge: 2022-03-26 | Disposition: A | Discharge status: Home / Self Care | Payer: Medicare Other | Source: Ambulatory Visit | Attending: Physician Assistant | Admitting: Physician Assistant

## 2022-03-26 DIAGNOSIS — C50919 Malignant neoplasm of unspecified site of unspecified female breast: Secondary | ICD-10-CM | POA: Insufficient documentation

## 2022-03-26 DIAGNOSIS — E041 Nontoxic single thyroid nodule: Secondary | ICD-10-CM | POA: Insufficient documentation

## 2022-03-26 DIAGNOSIS — N2581 Secondary hyperparathyroidism of renal origin: Secondary | ICD-10-CM | POA: Diagnosis not present

## 2022-03-26 DIAGNOSIS — Z992 Dependence on renal dialysis: Secondary | ICD-10-CM | POA: Diagnosis not present

## 2022-03-26 DIAGNOSIS — D631 Anemia in chronic kidney disease: Secondary | ICD-10-CM | POA: Diagnosis not present

## 2022-03-26 DIAGNOSIS — N186 End stage renal disease: Secondary | ICD-10-CM | POA: Diagnosis not present

## 2022-03-26 DIAGNOSIS — D509 Iron deficiency anemia, unspecified: Secondary | ICD-10-CM | POA: Diagnosis not present

## 2022-03-26 DIAGNOSIS — N250 Renal osteodystrophy: Secondary | ICD-10-CM | POA: Diagnosis not present

## 2022-03-26 DIAGNOSIS — J341 Cyst and mucocele of nose and nasal sinus: Secondary | ICD-10-CM | POA: Diagnosis not present

## 2022-03-28 DIAGNOSIS — N250 Renal osteodystrophy: Secondary | ICD-10-CM | POA: Diagnosis not present

## 2022-03-28 DIAGNOSIS — D631 Anemia in chronic kidney disease: Secondary | ICD-10-CM | POA: Diagnosis not present

## 2022-03-28 DIAGNOSIS — D509 Iron deficiency anemia, unspecified: Secondary | ICD-10-CM | POA: Diagnosis not present

## 2022-03-28 DIAGNOSIS — Z992 Dependence on renal dialysis: Secondary | ICD-10-CM | POA: Diagnosis not present

## 2022-03-28 DIAGNOSIS — N186 End stage renal disease: Secondary | ICD-10-CM | POA: Diagnosis not present

## 2022-03-28 DIAGNOSIS — N2581 Secondary hyperparathyroidism of renal origin: Secondary | ICD-10-CM | POA: Diagnosis not present

## 2022-03-30 DIAGNOSIS — N2581 Secondary hyperparathyroidism of renal origin: Secondary | ICD-10-CM | POA: Diagnosis not present

## 2022-03-30 DIAGNOSIS — Z992 Dependence on renal dialysis: Secondary | ICD-10-CM | POA: Diagnosis not present

## 2022-03-30 DIAGNOSIS — D509 Iron deficiency anemia, unspecified: Secondary | ICD-10-CM | POA: Diagnosis not present

## 2022-03-30 DIAGNOSIS — D631 Anemia in chronic kidney disease: Secondary | ICD-10-CM | POA: Diagnosis not present

## 2022-03-30 DIAGNOSIS — N186 End stage renal disease: Secondary | ICD-10-CM | POA: Diagnosis not present

## 2022-03-30 DIAGNOSIS — N250 Renal osteodystrophy: Secondary | ICD-10-CM | POA: Diagnosis not present

## 2022-03-30 NOTE — Telephone Encounter (Signed)
Pt was up front waiting to get this medication. She was given a box of Linzess 290 mcg. Advised the pt to please wait until she is called before showing up because we may/maynot have samples. The pt expressed understanding ?

## 2022-04-02 DIAGNOSIS — N2581 Secondary hyperparathyroidism of renal origin: Secondary | ICD-10-CM | POA: Diagnosis not present

## 2022-04-02 DIAGNOSIS — D631 Anemia in chronic kidney disease: Secondary | ICD-10-CM | POA: Diagnosis not present

## 2022-04-02 DIAGNOSIS — D509 Iron deficiency anemia, unspecified: Secondary | ICD-10-CM | POA: Diagnosis not present

## 2022-04-02 DIAGNOSIS — N250 Renal osteodystrophy: Secondary | ICD-10-CM | POA: Diagnosis not present

## 2022-04-02 DIAGNOSIS — N186 End stage renal disease: Secondary | ICD-10-CM | POA: Diagnosis not present

## 2022-04-02 DIAGNOSIS — Z992 Dependence on renal dialysis: Secondary | ICD-10-CM | POA: Diagnosis not present

## 2022-04-04 DIAGNOSIS — Z992 Dependence on renal dialysis: Secondary | ICD-10-CM | POA: Diagnosis not present

## 2022-04-04 DIAGNOSIS — D509 Iron deficiency anemia, unspecified: Secondary | ICD-10-CM | POA: Diagnosis not present

## 2022-04-04 DIAGNOSIS — D631 Anemia in chronic kidney disease: Secondary | ICD-10-CM | POA: Diagnosis not present

## 2022-04-04 DIAGNOSIS — N186 End stage renal disease: Secondary | ICD-10-CM | POA: Diagnosis not present

## 2022-04-04 DIAGNOSIS — N2581 Secondary hyperparathyroidism of renal origin: Secondary | ICD-10-CM | POA: Diagnosis not present

## 2022-04-04 DIAGNOSIS — N250 Renal osteodystrophy: Secondary | ICD-10-CM | POA: Diagnosis not present

## 2022-04-04 NOTE — Telephone Encounter (Signed)
Documentation from [facility]/pt assistancehas approved the pt for Linzess 290 mcg capsule through 12/16/2022. Pt is aware. Will give to Manuela Schwartz for scan ?

## 2022-04-06 DIAGNOSIS — N186 End stage renal disease: Secondary | ICD-10-CM | POA: Diagnosis not present

## 2022-04-06 DIAGNOSIS — D509 Iron deficiency anemia, unspecified: Secondary | ICD-10-CM | POA: Diagnosis not present

## 2022-04-06 DIAGNOSIS — D631 Anemia in chronic kidney disease: Secondary | ICD-10-CM | POA: Diagnosis not present

## 2022-04-06 DIAGNOSIS — N250 Renal osteodystrophy: Secondary | ICD-10-CM | POA: Diagnosis not present

## 2022-04-06 DIAGNOSIS — N2581 Secondary hyperparathyroidism of renal origin: Secondary | ICD-10-CM | POA: Diagnosis not present

## 2022-04-06 DIAGNOSIS — Z992 Dependence on renal dialysis: Secondary | ICD-10-CM | POA: Diagnosis not present

## 2022-04-09 DIAGNOSIS — N2581 Secondary hyperparathyroidism of renal origin: Secondary | ICD-10-CM | POA: Diagnosis not present

## 2022-04-09 DIAGNOSIS — Z992 Dependence on renal dialysis: Secondary | ICD-10-CM | POA: Diagnosis not present

## 2022-04-09 DIAGNOSIS — N250 Renal osteodystrophy: Secondary | ICD-10-CM | POA: Diagnosis not present

## 2022-04-09 DIAGNOSIS — N186 End stage renal disease: Secondary | ICD-10-CM | POA: Diagnosis not present

## 2022-04-09 DIAGNOSIS — D509 Iron deficiency anemia, unspecified: Secondary | ICD-10-CM | POA: Diagnosis not present

## 2022-04-09 DIAGNOSIS — D631 Anemia in chronic kidney disease: Secondary | ICD-10-CM | POA: Diagnosis not present

## 2022-04-11 DIAGNOSIS — D631 Anemia in chronic kidney disease: Secondary | ICD-10-CM | POA: Diagnosis not present

## 2022-04-11 DIAGNOSIS — N186 End stage renal disease: Secondary | ICD-10-CM | POA: Diagnosis not present

## 2022-04-11 DIAGNOSIS — Z992 Dependence on renal dialysis: Secondary | ICD-10-CM | POA: Diagnosis not present

## 2022-04-11 DIAGNOSIS — D509 Iron deficiency anemia, unspecified: Secondary | ICD-10-CM | POA: Diagnosis not present

## 2022-04-11 DIAGNOSIS — N2581 Secondary hyperparathyroidism of renal origin: Secondary | ICD-10-CM | POA: Diagnosis not present

## 2022-04-11 DIAGNOSIS — N250 Renal osteodystrophy: Secondary | ICD-10-CM | POA: Diagnosis not present

## 2022-04-13 DIAGNOSIS — D509 Iron deficiency anemia, unspecified: Secondary | ICD-10-CM | POA: Diagnosis not present

## 2022-04-13 DIAGNOSIS — D631 Anemia in chronic kidney disease: Secondary | ICD-10-CM | POA: Diagnosis not present

## 2022-04-13 DIAGNOSIS — N250 Renal osteodystrophy: Secondary | ICD-10-CM | POA: Diagnosis not present

## 2022-04-13 DIAGNOSIS — N2581 Secondary hyperparathyroidism of renal origin: Secondary | ICD-10-CM | POA: Diagnosis not present

## 2022-04-13 DIAGNOSIS — N186 End stage renal disease: Secondary | ICD-10-CM | POA: Diagnosis not present

## 2022-04-13 DIAGNOSIS — Z992 Dependence on renal dialysis: Secondary | ICD-10-CM | POA: Diagnosis not present

## 2022-04-15 DIAGNOSIS — Z992 Dependence on renal dialysis: Secondary | ICD-10-CM | POA: Diagnosis not present

## 2022-04-15 DIAGNOSIS — N186 End stage renal disease: Secondary | ICD-10-CM | POA: Diagnosis not present

## 2022-04-15 DIAGNOSIS — E782 Mixed hyperlipidemia: Secondary | ICD-10-CM | POA: Diagnosis not present

## 2022-04-15 DIAGNOSIS — I10 Essential (primary) hypertension: Secondary | ICD-10-CM | POA: Diagnosis not present

## 2022-04-16 DIAGNOSIS — D509 Iron deficiency anemia, unspecified: Secondary | ICD-10-CM | POA: Diagnosis not present

## 2022-04-16 DIAGNOSIS — D631 Anemia in chronic kidney disease: Secondary | ICD-10-CM | POA: Diagnosis not present

## 2022-04-16 DIAGNOSIS — N2581 Secondary hyperparathyroidism of renal origin: Secondary | ICD-10-CM | POA: Diagnosis not present

## 2022-04-16 DIAGNOSIS — N186 End stage renal disease: Secondary | ICD-10-CM | POA: Diagnosis not present

## 2022-04-16 DIAGNOSIS — N250 Renal osteodystrophy: Secondary | ICD-10-CM | POA: Diagnosis not present

## 2022-04-16 DIAGNOSIS — Z992 Dependence on renal dialysis: Secondary | ICD-10-CM | POA: Diagnosis not present

## 2022-04-17 DIAGNOSIS — Z992 Dependence on renal dialysis: Secondary | ICD-10-CM | POA: Diagnosis not present

## 2022-04-17 DIAGNOSIS — N186 End stage renal disease: Secondary | ICD-10-CM | POA: Diagnosis not present

## 2022-04-17 DIAGNOSIS — E559 Vitamin D deficiency, unspecified: Secondary | ICD-10-CM | POA: Diagnosis not present

## 2022-04-17 DIAGNOSIS — N2581 Secondary hyperparathyroidism of renal origin: Secondary | ICD-10-CM | POA: Diagnosis not present

## 2022-04-19 DIAGNOSIS — E559 Vitamin D deficiency, unspecified: Secondary | ICD-10-CM | POA: Diagnosis not present

## 2022-04-19 DIAGNOSIS — Z992 Dependence on renal dialysis: Secondary | ICD-10-CM | POA: Diagnosis not present

## 2022-04-19 DIAGNOSIS — N186 End stage renal disease: Secondary | ICD-10-CM | POA: Diagnosis not present

## 2022-04-19 DIAGNOSIS — N2581 Secondary hyperparathyroidism of renal origin: Secondary | ICD-10-CM | POA: Diagnosis not present

## 2022-04-21 DIAGNOSIS — Z992 Dependence on renal dialysis: Secondary | ICD-10-CM | POA: Diagnosis not present

## 2022-04-21 DIAGNOSIS — N2581 Secondary hyperparathyroidism of renal origin: Secondary | ICD-10-CM | POA: Diagnosis not present

## 2022-04-21 DIAGNOSIS — N186 End stage renal disease: Secondary | ICD-10-CM | POA: Diagnosis not present

## 2022-04-21 DIAGNOSIS — E559 Vitamin D deficiency, unspecified: Secondary | ICD-10-CM | POA: Diagnosis not present

## 2022-04-22 DIAGNOSIS — N250 Renal osteodystrophy: Secondary | ICD-10-CM | POA: Diagnosis not present

## 2022-04-22 DIAGNOSIS — N186 End stage renal disease: Secondary | ICD-10-CM | POA: Diagnosis not present

## 2022-04-22 DIAGNOSIS — N2581 Secondary hyperparathyroidism of renal origin: Secondary | ICD-10-CM | POA: Diagnosis not present

## 2022-04-22 DIAGNOSIS — D509 Iron deficiency anemia, unspecified: Secondary | ICD-10-CM | POA: Diagnosis not present

## 2022-04-22 DIAGNOSIS — D631 Anemia in chronic kidney disease: Secondary | ICD-10-CM | POA: Diagnosis not present

## 2022-04-22 DIAGNOSIS — Z992 Dependence on renal dialysis: Secondary | ICD-10-CM | POA: Diagnosis not present

## 2022-04-23 ENCOUNTER — Telehealth: Payer: Self-pay | Admitting: "Endocrinology

## 2022-04-23 DIAGNOSIS — N186 End stage renal disease: Secondary | ICD-10-CM | POA: Diagnosis not present

## 2022-04-23 DIAGNOSIS — D631 Anemia in chronic kidney disease: Secondary | ICD-10-CM | POA: Diagnosis not present

## 2022-04-23 DIAGNOSIS — Z992 Dependence on renal dialysis: Secondary | ICD-10-CM | POA: Diagnosis not present

## 2022-04-23 DIAGNOSIS — N250 Renal osteodystrophy: Secondary | ICD-10-CM | POA: Diagnosis not present

## 2022-04-23 DIAGNOSIS — D509 Iron deficiency anemia, unspecified: Secondary | ICD-10-CM | POA: Diagnosis not present

## 2022-04-23 DIAGNOSIS — N2581 Secondary hyperparathyroidism of renal origin: Secondary | ICD-10-CM | POA: Diagnosis not present

## 2022-04-23 NOTE — Telephone Encounter (Signed)
Dr Dorris Fetch ?Will you review this patients chart? She was referred her for a nodule. Whitney looked at the referral and said it seems very complicated because she is seeing ENT as well. Please advise and I will call pt ?

## 2022-04-25 DIAGNOSIS — Z992 Dependence on renal dialysis: Secondary | ICD-10-CM | POA: Diagnosis not present

## 2022-04-25 DIAGNOSIS — N250 Renal osteodystrophy: Secondary | ICD-10-CM | POA: Diagnosis not present

## 2022-04-25 DIAGNOSIS — D631 Anemia in chronic kidney disease: Secondary | ICD-10-CM | POA: Diagnosis not present

## 2022-04-25 DIAGNOSIS — D509 Iron deficiency anemia, unspecified: Secondary | ICD-10-CM | POA: Diagnosis not present

## 2022-04-25 DIAGNOSIS — N2581 Secondary hyperparathyroidism of renal origin: Secondary | ICD-10-CM | POA: Diagnosis not present

## 2022-04-25 DIAGNOSIS — N186 End stage renal disease: Secondary | ICD-10-CM | POA: Diagnosis not present

## 2022-04-27 DIAGNOSIS — N2581 Secondary hyperparathyroidism of renal origin: Secondary | ICD-10-CM | POA: Diagnosis not present

## 2022-04-27 DIAGNOSIS — N250 Renal osteodystrophy: Secondary | ICD-10-CM | POA: Diagnosis not present

## 2022-04-27 DIAGNOSIS — Z992 Dependence on renal dialysis: Secondary | ICD-10-CM | POA: Diagnosis not present

## 2022-04-27 DIAGNOSIS — N186 End stage renal disease: Secondary | ICD-10-CM | POA: Diagnosis not present

## 2022-04-27 DIAGNOSIS — D631 Anemia in chronic kidney disease: Secondary | ICD-10-CM | POA: Diagnosis not present

## 2022-04-27 DIAGNOSIS — D509 Iron deficiency anemia, unspecified: Secondary | ICD-10-CM | POA: Diagnosis not present

## 2022-04-30 DIAGNOSIS — N250 Renal osteodystrophy: Secondary | ICD-10-CM | POA: Diagnosis not present

## 2022-04-30 DIAGNOSIS — N2581 Secondary hyperparathyroidism of renal origin: Secondary | ICD-10-CM | POA: Diagnosis not present

## 2022-04-30 DIAGNOSIS — D509 Iron deficiency anemia, unspecified: Secondary | ICD-10-CM | POA: Diagnosis not present

## 2022-04-30 DIAGNOSIS — Z992 Dependence on renal dialysis: Secondary | ICD-10-CM | POA: Diagnosis not present

## 2022-04-30 DIAGNOSIS — D631 Anemia in chronic kidney disease: Secondary | ICD-10-CM | POA: Diagnosis not present

## 2022-04-30 DIAGNOSIS — N186 End stage renal disease: Secondary | ICD-10-CM | POA: Diagnosis not present

## 2022-05-02 DIAGNOSIS — N2581 Secondary hyperparathyroidism of renal origin: Secondary | ICD-10-CM | POA: Diagnosis not present

## 2022-05-02 DIAGNOSIS — D509 Iron deficiency anemia, unspecified: Secondary | ICD-10-CM | POA: Diagnosis not present

## 2022-05-02 DIAGNOSIS — N186 End stage renal disease: Secondary | ICD-10-CM | POA: Diagnosis not present

## 2022-05-02 DIAGNOSIS — Z992 Dependence on renal dialysis: Secondary | ICD-10-CM | POA: Diagnosis not present

## 2022-05-02 DIAGNOSIS — N250 Renal osteodystrophy: Secondary | ICD-10-CM | POA: Diagnosis not present

## 2022-05-02 DIAGNOSIS — D631 Anemia in chronic kidney disease: Secondary | ICD-10-CM | POA: Diagnosis not present
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
## 2022-05-03 ENCOUNTER — Ambulatory Visit (INDEPENDENT_AMBULATORY_CARE_PROVIDER_SITE_OTHER): Payer: Medicare Other | Admitting: Internal Medicine

## 2022-05-03 ENCOUNTER — Encounter: Payer: Self-pay | Admitting: Internal Medicine

## 2022-05-03 VITALS — BP 120/56 | HR 90 | Ht 63.0 in | Wt 241.6 lb

## 2022-05-03 DIAGNOSIS — I2510 Atherosclerotic heart disease of native coronary artery without angina pectoris: Secondary | ICD-10-CM

## 2022-05-03 NOTE — Patient Instructions (Signed)
Medication Instructions:  Your physician recommends that you continue on your current medications as directed. Please refer to the Current Medication list given to you today.  *If you need a refill on your cardiac medications before your next appointment, please call your pharmacy*   Lab Work: Your physician recommends that you return for lab work with next blood draw. ( Lipid panel, liver panel)   If you have labs (blood work) drawn today and your tests are completely normal, you will receive your results only by: MyChart Message (if you have MyChart) OR A paper copy in the mail If you have any lab test that is abnormal or we need to change your treatment, we will call you to review the results.   Testing/Procedures: NONE    Follow-Up: At [facility], you and your health needs are our priority.  As part of our continuing mission to provide you with exceptional heart care, we have created designated Provider Care Teams.  These Care Teams include your primary Cardiologist (physician) and Advanced Practice Providers (APPs -  Physician Assistants and Nurse Practitioners) who all work together to provide you with the care you need, when you need it.  We recommend signing up for the patient portal called "MyChart".  Sign up information is provided on this After Visit Summary.  MyChart is used to connect with patients for Virtual Visits (Telemedicine).  Patients are able to view lab/test results, encounter notes, upcoming appointments, etc.  Non-urgent messages can be sent to your provider as well.   To learn more about what you can do with MyChart, go to [email or web address].    Your next appointment:    December/ January   The format for your next appointment:   In Person  Provider:   Dorris Carnes, MD    Other Instructions Thank you for choosing [facility]!    Important Information About Sugar

## 2022-05-04 ENCOUNTER — Telehealth: Payer: Self-pay | Admitting: *Deleted

## 2022-05-04 DIAGNOSIS — H6992 Unspecified Eustachian tube disorder, left ear: Secondary | ICD-10-CM

## 2022-05-04 DIAGNOSIS — N186 End stage renal disease: Secondary | ICD-10-CM | POA: Diagnosis not present

## 2022-05-04 DIAGNOSIS — N250 Renal osteodystrophy: Secondary | ICD-10-CM | POA: Diagnosis not present

## 2022-05-04 DIAGNOSIS — D509 Iron deficiency anemia, unspecified: Secondary | ICD-10-CM | POA: Diagnosis not present

## 2022-05-04 DIAGNOSIS — Z992 Dependence on renal dialysis: Secondary | ICD-10-CM | POA: Diagnosis not present

## 2022-05-04 DIAGNOSIS — N2581 Secondary hyperparathyroidism of renal origin: Secondary | ICD-10-CM | POA: Diagnosis not present

## 2022-05-04 DIAGNOSIS — D631 Anemia in chronic kidney disease: Secondary | ICD-10-CM | POA: Diagnosis not present

## 2022-05-04 NOTE — Telephone Encounter (Signed)
Referral placed. Called to notify pt, no answer left msg to call back

## 2022-05-04 NOTE — Telephone Encounter (Signed)
-----   Message from Fay Records, MD sent at 05/03/2022 11:37 PM EDT ----- Please refer pt to ENT for L eustachian tube problems

## 2022-05-04 NOTE — Telephone Encounter (Signed)
Pt called back and notified of ENT referral.

## 2022-05-07 DIAGNOSIS — N186 End stage renal disease: Secondary | ICD-10-CM | POA: Diagnosis not present

## 2022-05-07 DIAGNOSIS — D631 Anemia in chronic kidney disease: Secondary | ICD-10-CM | POA: Diagnosis not present

## 2022-05-07 DIAGNOSIS — N2581 Secondary hyperparathyroidism of renal origin: Secondary | ICD-10-CM | POA: Diagnosis not present

## 2022-05-07 DIAGNOSIS — N250 Renal osteodystrophy: Secondary | ICD-10-CM | POA: Diagnosis not present

## 2022-05-07 DIAGNOSIS — D509 Iron deficiency anemia, unspecified: Secondary | ICD-10-CM | POA: Diagnosis not present

## 2022-05-07 DIAGNOSIS — Z992 Dependence on renal dialysis: Secondary | ICD-10-CM | POA: Diagnosis not present

## 2022-05-09 DIAGNOSIS — N2581 Secondary hyperparathyroidism of renal origin: Secondary | ICD-10-CM | POA: Diagnosis not present

## 2022-05-09 DIAGNOSIS — N250 Renal osteodystrophy: Secondary | ICD-10-CM | POA: Diagnosis not present

## 2022-05-09 DIAGNOSIS — D631 Anemia in chronic kidney disease: Secondary | ICD-10-CM | POA: Diagnosis not present

## 2022-05-09 DIAGNOSIS — D509 Iron deficiency anemia, unspecified: Secondary | ICD-10-CM | POA: Diagnosis not present

## 2022-05-09 DIAGNOSIS — Z992 Dependence on renal dialysis: Secondary | ICD-10-CM | POA: Diagnosis not present

## 2022-05-09 DIAGNOSIS — N186 End stage renal disease: Secondary | ICD-10-CM | POA: Diagnosis not present

## 2022-05-11 DIAGNOSIS — N250 Renal osteodystrophy: Secondary | ICD-10-CM | POA: Diagnosis not present

## 2022-05-11 DIAGNOSIS — D631 Anemia in chronic kidney disease: Secondary | ICD-10-CM | POA: Diagnosis not present

## 2022-05-11 DIAGNOSIS — Z992 Dependence on renal dialysis: Secondary | ICD-10-CM | POA: Diagnosis not present

## 2022-05-11 DIAGNOSIS — N2581 Secondary hyperparathyroidism of renal origin: Secondary | ICD-10-CM | POA: Diagnosis not present

## 2022-05-11 DIAGNOSIS — D509 Iron deficiency anemia, unspecified: Secondary | ICD-10-CM | POA: Diagnosis not present

## 2022-05-11 DIAGNOSIS — N186 End stage renal disease: Secondary | ICD-10-CM | POA: Diagnosis not present

## 2022-05-14 DIAGNOSIS — D509 Iron deficiency anemia, unspecified: Secondary | ICD-10-CM | POA: Diagnosis not present

## 2022-05-14 DIAGNOSIS — Z992 Dependence on renal dialysis: Secondary | ICD-10-CM | POA: Diagnosis not present

## 2022-05-14 DIAGNOSIS — N2581 Secondary hyperparathyroidism of renal origin: Secondary | ICD-10-CM | POA: Diagnosis not present

## 2022-05-14 DIAGNOSIS — N186 End stage renal disease: Secondary | ICD-10-CM | POA: Diagnosis not present

## 2022-05-14 DIAGNOSIS — N250 Renal osteodystrophy: Secondary | ICD-10-CM | POA: Diagnosis not present

## 2022-05-14 DIAGNOSIS — D631 Anemia in chronic kidney disease: Secondary | ICD-10-CM | POA: Diagnosis not present

## 2022-05-16 DIAGNOSIS — N186 End stage renal disease: Secondary | ICD-10-CM | POA: Diagnosis not present

## 2022-05-16 DIAGNOSIS — Z992 Dependence on renal dialysis: Secondary | ICD-10-CM | POA: Diagnosis not present

## 2022-05-16 DIAGNOSIS — N250 Renal osteodystrophy: Secondary | ICD-10-CM | POA: Diagnosis not present

## 2022-05-16 DIAGNOSIS — D631 Anemia in chronic kidney disease: Secondary | ICD-10-CM | POA: Diagnosis not present

## 2022-05-16 DIAGNOSIS — N2581 Secondary hyperparathyroidism of renal origin: Secondary | ICD-10-CM | POA: Diagnosis not present

## 2022-05-16 DIAGNOSIS — D509 Iron deficiency anemia, unspecified: Secondary | ICD-10-CM | POA: Diagnosis not present

## 2022-05-17 ENCOUNTER — Other Ambulatory Visit: Payer: Self-pay | Admitting: Internal Medicine

## 2022-05-17 ENCOUNTER — Telehealth: Payer: Self-pay | Admitting: Internal Medicine

## 2022-05-17 ENCOUNTER — Encounter: Payer: Self-pay | Admitting: "Endocrinology

## 2022-05-17 ENCOUNTER — Ambulatory Visit (INDEPENDENT_AMBULATORY_CARE_PROVIDER_SITE_OTHER): Payer: Medicare Other | Admitting: "Endocrinology

## 2022-05-17 VITALS — BP 136/74 | HR 52 | Ht 63.0 in | Wt 239.2 lb

## 2022-05-17 DIAGNOSIS — I2510 Atherosclerotic heart disease of native coronary artery without angina pectoris: Secondary | ICD-10-CM

## 2022-05-17 DIAGNOSIS — N250 Renal osteodystrophy: Secondary | ICD-10-CM | POA: Diagnosis not present

## 2022-05-17 DIAGNOSIS — E049 Nontoxic goiter, unspecified: Secondary | ICD-10-CM

## 2022-05-17 DIAGNOSIS — Z992 Dependence on renal dialysis: Secondary | ICD-10-CM | POA: Diagnosis not present

## 2022-05-17 DIAGNOSIS — N2581 Secondary hyperparathyroidism of renal origin: Secondary | ICD-10-CM | POA: Diagnosis not present

## 2022-05-17 DIAGNOSIS — D509 Iron deficiency anemia, unspecified: Secondary | ICD-10-CM | POA: Diagnosis not present

## 2022-05-17 DIAGNOSIS — E785 Hyperlipidemia, unspecified: Secondary | ICD-10-CM | POA: Diagnosis not present

## 2022-05-17 DIAGNOSIS — N186 End stage renal disease: Secondary | ICD-10-CM | POA: Diagnosis not present

## 2022-05-17 DIAGNOSIS — D631 Anemia in chronic kidney disease: Secondary | ICD-10-CM | POA: Diagnosis not present

## 2022-05-17 NOTE — Telephone Encounter (Signed)
Dx. Code for liver and lipid fax to provided number.

## 2022-05-17 NOTE — Progress Notes (Signed)
Endocrinology Consult Note                                            05/17/2022, 4:02 PM   Subjective:    Patient ID: Patricia [name], female    DOB: 1971-12-15, PCP Jake Samples, PA-C   Past Medical History:  Diagnosis Date  . Anemia   . Ankle fracture   . Arthritis   . Blood transfusion without reported diagnosis   . Breast cancer ([place])   . Cancer ([place])   . Diabetes mellitus without complication ([place])   . Dialysis patient ([place])    mon, wed, friday,   . End stage renal disease on dialysis [facility])    M/W/F [facility] in [place]  . GERD (gastroesophageal reflux disease)   . Hypertension   . OSA (obstructive sleep apnea)    uses CPAP sometimes  . Pneumonia   . PONV (postoperative nausea and vomiting)   . Wears glasses    Past Surgical History:  Procedure Laterality Date  . ABDOMINAL HYSTERECTOMY    . AV FISTULA PLACEMENT  11/2014   at [place] N/A 04/2021  . BALLOON DILATION N/A 07/10/2016   Procedure: BALLOON DILATION;  Surgeon: Danie Binder, MD;  [facility];  Service: Endoscopy;  Laterality: N/A;  Pyloric dilation  . BREAST LUMPECTOMY    . CESAREAN SECTION    . CHOLECYSTECTOMY    . COLONOSCOPY WITH PROPOFOL N/A 09/27/2016   Dr. Gala Romney: Internal hemorrhoids repeat colonoscopy in 10 years  . DILATION AND CURETTAGE OF UTERUS    . ESOPHAGOGASTRODUODENOSCOPY N/A 07/10/2016   Dr.Fields- normal esophagus, gastric stenosis was found at the pylorus, gastritis on bx, normal examined duodenun  . EXTERNAL FIXATION REMOVAL Right 10/29/2018   Procedure: REMOVAL RIGHT ANKLE BIOMET ZIMMER EXTERNAL FIXATOR, SHORT LEG CAST APPLICATION;  Surgeon: Marybelle Killings, MD;  Location: [place];  Service: Orthopedics;  Laterality: Right;  . MASTECTOMY     left sided  . ORIF ANKLE FRACTURE Right 10/06/2018   Procedure: OPEN REDUCTION INTERNAL FIXATION (ORIF) RIGHT ANKLE TRIMALLEOLAR;  Surgeon: Marybelle Killings, MD;  Location: [place];  Service:  Orthopedics;  Laterality: Right;   Social History   Socioeconomic History  . Marital status: Widowed    Spouse name: Not on file  . Number of children: Not on file  . Years of education: Not on file  . Highest education level: Not on file  Occupational History  . Not on file  Tobacco Use  . Smoking status: Never  . Smokeless tobacco: Never  Vaping Use  . Vaping Use: Never used  Substance and Sexual Activity  . Alcohol use: No    Alcohol/week: 0.0 standard drinks  . Drug use: No  . Sexual activity: Not on file  Other Topics Concern  . Not on file  Social History Narrative  . Not on file   Social Determinants of Health   Financial Resource Strain: Not on file  Food Insecurity: Not on file  Transportation Needs: Not on file  Physical Activity: Not on file  Stress: Not on file  Social Connections: Not on file   Family History  Problem Relation Age of Onset  . Diabetes Mellitus II Mother   . Hypertension Mother   . Heart block Mother   . Hypertension  Sister   . Hypertension Sister   . Colon cancer Neg Hx    Outpatient Encounter Medications as of 05/17/2022  Medication Sig  . acetaminophen (TYLENOL) 325 MG tablet Take 650 mg by mouth every 6 (six) hours as needed.  Marland Kitchen albuterol (PROVENTIL) (2.5 MG/3ML) 0.083% nebulizer solution Take 3 mLs (2.5 mg total) by nebulization every 6 (six) hours as needed for wheezing or shortness of breath.  Marland Kitchen aspirin EC 81 MG tablet Take 1 tablet (81 mg total) by mouth daily. Swallow whole.  . cetirizine (ZYRTEC) 10 MG tablet Take 10 mg by mouth daily.  . cinacalcet (SENSIPAR) 60 MG tablet Take 30 mg by mouth daily with breakfast.  . dicyclomine (BENTYL) 10 MG capsule Take 1 capsule (10 mg total) by mouth 2 (two) times daily as needed.  . ferric citrate (AURYXIA) 1 GM 210 MG(Fe) tablet Take 2 tablets by mouth 2 (two) times daily with a meal. With Breakfast & with supper  . glipiZIDE (GLUCOTROL) 10 MG tablet Take 10 mg by mouth 2 (two) times  daily.  Marland Kitchen ipratropium (ATROVENT) 0.03 % nasal spray Place 2 sprays into both nostrils 2 (two) times daily.   Marland Kitchen labetalol (NORMODYNE) 100 MG tablet Take 100 mg by mouth 2 (two) times daily.  Marland Kitchen linaclotide (LINZESS) 290 MCG CAPS capsule Take 290 mcg by mouth daily before breakfast.  . montelukast (SINGULAIR) 10 MG tablet Take 1 tablet (10 mg total) by mouth at bedtime.  . multivitamin (RENA-VIT) TABS tablet Take 1 tablet by mouth daily.  Marland Kitchen NIFEdipine (ADALAT CC) 60 MG 24 hr tablet Take 60 mg by mouth daily.  . ondansetron (ZOFRAN-ODT) 4 MG disintegrating tablet Take 4 mg by mouth as needed for nausea or vomiting.   . pantoprazole (PROTONIX) 40 MG tablet Take 1 tablet (40 mg total) by mouth daily. 30 minutes before breakfast  . rOPINIRole (REQUIP XL) 2 MG 24 hr tablet Take 2 mg by mouth at bedtime.   . rosuvastatin (CRESTOR) 10 MG tablet Take 1 tablet (10 mg total) by mouth daily.  . sevelamer carbonate (RENVELA) 800 MG tablet Take 800 mg by mouth See admin instructions. Take 2 tablets (1600 mg) in the morning, take 3 tablets (2400 mg) with lunch or snack,  and take 2 tablets (1600 mg) by mouth in the evening with dinner meal.  . sodium chloride (OCEAN) 0.65 % SOLN nasal spray Place 1 spray into both nostrils as needed for congestion.  . SYMBICORT 160-4.5 MCG/ACT inhaler Inhale into the lungs.  . [DISCONTINUED] glipiZIDE (GLUCOTROL) 5 MG tablet Take 10 mg by mouth 2 (two) times daily as needed. For blood sugar levels over 150   No facility-administered encounter medications on file as of 05/17/2022.   ALLERGIES: Allergies  Allergen Reactions  . Amlodipine Besylate Rash and Other (See Comments)    dizziness  . Reglan [Metoclopramide] Other (See Comments)    hallucinations     VACCINATION STATUS: Immunization History  Administered Date(s) Administered  . Influenza Split 09/20/2015  . Influenza-Unspecified 09/30/2014, 08/25/2015, 09/06/2016, 08/19/2017, 10/04/2017, 10/15/2018, 09/25/2019,  10/03/2020  . Moderna SARS-COV2 Booster Vaccination 11/07/2021  . Moderna Sars-Covid-2 Vaccination 03/31/2020, 05/02/2020, 12/16/2020  . PPD Test 08/10/2015, 09/06/2015, 02/10/2016, 09/09/2017, 10/20/2018, 11/23/2019, 11/30/2020, 11/08/2021, 12/06/2021  . Pneumococcal-Unspecified 03/02/2016, 02/20/2017    HPI Patricia [name] is 51 y.o. female who presents today with a medical history as above. she is being seen in consultation for *** requested by Jake Samples, PA-C.  she has been dealing with symptoms  of  ***, ***, ***, and *** for ***. she denies ***.  Review of Systems  Constitutional: no recent weight gain/loss, no fatigue, no subjective hyperthermia, no subjective hypothermia Eyes: no blurry vision, no xerophthalmia ENT: no sore throat, no nodules palpated in throat, no dysphagia/odynophagia, no hoarseness Cardiovascular: no Chest Pain, no Shortness of Breath, no palpitations, no leg swelling Respiratory: no cough, no shortness of breath Gastrointestinal: no Nausea/Vomiting/Diarhhea Musculoskeletal: no muscle/joint aches Skin: no rashes Neurological: no tremors, no numbness, no tingling, no dizziness Psychiatric: no depression, no anxiety  Objective:       05/17/2022    9:07 AM 05/03/2022    8:37 AM 03/20/2022    8:23 AM  Vitals with BMI  Height 5\' 3"  5\' 3"  5\' 3"   Weight 239 lbs 3 oz 241 lbs 10 oz 234 lbs  BMI 42.38 14.78 29.56  Systolic 213 086   Diastolic 74 56   Pulse 52 90     BP 136/74   Pulse (!) 52   Ht 5\' 3"  (1.6 m)   Wt 239 lb 3.2 oz (108.5 kg)   BMI 42.37 kg/m   Wt Readings from Last 3 Encounters:  05/17/22 239 lb 3.2 oz (108.5 kg)  05/03/22 241 lb 9.6 oz (109.6 kg)  03/20/22 234 lb (106.1 kg)    Physical Exam  Constitutional:  Body mass index is 42.37 kg/m.,  not in acute distress, normal state of mind Eyes: PERRLA, EOMI, no exophthalmos ENT: moist mucous membranes, no gross thyromegaly, no gross cervical lymphadenopathy Cardiovascular: normal  precordial activity, Regular Rate and Rhythm, no Murmur/Rubs/Gallops Respiratory:  adequate breathing efforts, no gross chest deformity, Clear to auscultation bilaterally Gastrointestinal: abdomen soft, Non -tender, No distension, Bowel Sounds present, no gross organomegaly Musculoskeletal: no gross deformities, strength intact in all four extremities Skin: moist, warm, no rashes Neurological: no tremor with outstretched hands, Deep tendon reflexes normal in bilateral lower extremities.  CMP ( most recent) CMP     Component Value Date/Time   NA 139 03/02/2022 1115   K 3.9 03/02/2022 1115   CL 97 (L) 03/02/2022 1115   CO2 26 03/02/2022 1115   GLUCOSE 239 (H) 03/02/2022 1115   BUN 31 (H) 03/02/2022 1115   CREATININE 7.01 (H) 03/02/2022 1115   CREATININE 5.93 (H) 06/29/2019 1210   CALCIUM 9.8 03/02/2022 1115   PROT 7.5 06/26/2021 1203   ALBUMIN 4.2 06/28/2021 0451   AST 23 06/26/2021 1203   ALT 37 06/26/2021 1203   ALKPHOS 114 06/26/2021 1203   BILITOT 0.7 06/26/2021 1203   GFRNONAA 7 (L) 03/02/2022 1115   GFRNONAA 8 (L) 06/29/2019 1210   GFRAA 4 (L) 08/04/2020 0452   GFRAA 9 (L) 06/29/2019 1210     Diabetic Labs (most recent): Lab Results  Component Value Date   HGBA1C 8.3 (H) 06/26/2021   HGBA1C 8.4 (H) 08/04/2020   HGBA1C 7.3 (H) 12/29/2019     Lipid Panel ( most recent) Lipid Panel     Component Value Date/Time   CHOL 206 (H) 01/04/2022 1003   TRIG 51 01/04/2022 1003   HDL 106 01/04/2022 1003   CHOLHDL 1.9 01/04/2022 1003   VLDL 10 01/04/2022 1003   LDLCALC 90 01/04/2022 1003      Lab Results  Component Value Date   TSH 0.867 09/27/2016           Assessment & Plan:   1. Nodular goiter *** - TSH - T4, free - T3, free - Thyroid peroxidase antibody -  Thyroglobulin antibody   - Patricia [name]  is being seen at a kind request of Jake Samples, PA-C. - I have reviewed her available *** records and clinically evaluated the patient. - Based on  these reviews, she has ***,  however,  there is not sufficient information to proceed with definitive treatment plan.  - she will need a repeat,  more complete *** towards confirming the diagnosis.  -she will return in *** week to review her repeat labs.   If her  labs are suggestive of ***, she will be considered for *** to confirm the diagnosis.  - I did not initiate any new prescriptions today. - she is advised to maintain close follow up with Jake Samples, PA-C for primary care needs.   - Time spent with the patient: 60 minutes, of which >50% was spent in  counseling her about her *** and the rest in obtaining information about her symptoms, reviewing her previous labs/studies ( including abstractions from other facilities),  evaluations, and treatments,  and developing a plan to confirm diagnosis and Patricia [name] term treatment based on the latest standards of care/guidelines; and documenting her care.  Patricia [name] participated in the discussions, expressed understanding, and voiced agreement with the above plans.  All questions were answered to her satisfaction. she is encouraged to contact clinic should she have any questions or concerns prior to her return visit.  Follow up plan: Return in about 1 week (around 05/24/2022) for Labs Today- Non-Fasting Ok.   Glade Lloyd, MD [facility] [address] [place], [place] [address] Phone: [phone]  Fax: [phone]     05/17/2022, 4:02 PM  This note was partially dictated with voice recognition software. Similar sounding words can be transcribed inadequately or may not  be corrected upon review.

## 2022-05-17 NOTE — Telephone Encounter (Signed)
Pt has written Rx to have lab done. She will see it they can be done at [facility]. I informed pt that if there are any questions from [facility] that can call the office.

## 2022-05-17 NOTE — Telephone Encounter (Signed)
Patient went to have lab work done at [facility], they told her they need the DX code and the test information so she can be billed correctly by medicare. It can be faxed to [phone].

## 2022-05-17 NOTE — Telephone Encounter (Signed)
Calling to see if the request for labs can be sent to [facility]. Please advise

## 2022-05-18 DIAGNOSIS — D631 Anemia in chronic kidney disease: Secondary | ICD-10-CM | POA: Diagnosis not present

## 2022-05-18 DIAGNOSIS — N2581 Secondary hyperparathyroidism of renal origin: Secondary | ICD-10-CM | POA: Diagnosis not present

## 2022-05-18 DIAGNOSIS — N186 End stage renal disease: Secondary | ICD-10-CM | POA: Diagnosis not present

## 2022-05-18 DIAGNOSIS — D509 Iron deficiency anemia, unspecified: Secondary | ICD-10-CM | POA: Diagnosis not present

## 2022-05-18 DIAGNOSIS — N250 Renal osteodystrophy: Secondary | ICD-10-CM | POA: Diagnosis not present

## 2022-05-18 DIAGNOSIS — Z992 Dependence on renal dialysis: Secondary | ICD-10-CM | POA: Diagnosis not present

## 2022-05-18 LAB — HEPATIC FUNCTION PANEL
ALT: 14 IU/L (ref 0–32)
AST: 17 IU/L (ref 0–40)
Albumin: 4.9 g/dL (ref 3.8–4.9)
Alkaline Phosphatase: 146 IU/L — ABNORMAL HIGH (ref 44–121)
Bilirubin Total: 0.4 mg/dL (ref 0.0–1.2)
Bilirubin, Direct: 0.18 mg/dL (ref 0.00–0.40)
Total Protein: 7.8 g/dL (ref 6.0–8.5)

## 2022-05-18 LAB — T4, FREE: Free T4: 1.17 ng/dL (ref 0.82–1.77)

## 2022-05-18 LAB — LIPID PANEL W/O CHOL/HDL RATIO
Cholesterol, Total: 185 mg/dL (ref 100–199)
HDL: 108 mg/dL (ref >39)
LDL Chol Calc (NIH): 66 mg/dL (ref 0–99)
Triglycerides: 58 mg/dL (ref 0–149)
VLDL Cholesterol Cal: 11 mg/dL (ref 5–40)

## 2022-05-18 LAB — TSH: TSH: 1.25 u[IU]/mL (ref 0.450–4.500)

## 2022-05-18 LAB — THYROID PEROXIDASE ANTIBODY: Thyroperoxidase Ab SerPl-aCnc: 12 IU/mL (ref 0–34)

## 2022-05-18 LAB — THYROGLOBULIN ANTIBODY: Thyroglobulin Antibody: <1 IU/mL (ref 0.0–0.9)

## 2022-05-18 LAB — T3, FREE: T3, Free: 2.4 pg/mL (ref 2.0–4.4)

## 2022-05-21 DIAGNOSIS — D509 Iron deficiency anemia, unspecified: Secondary | ICD-10-CM | POA: Diagnosis not present

## 2022-05-21 DIAGNOSIS — Z992 Dependence on renal dialysis: Secondary | ICD-10-CM | POA: Diagnosis not present

## 2022-05-21 DIAGNOSIS — D631 Anemia in chronic kidney disease: Secondary | ICD-10-CM | POA: Diagnosis not present

## 2022-05-21 DIAGNOSIS — N186 End stage renal disease: Secondary | ICD-10-CM | POA: Diagnosis not present

## 2022-05-21 DIAGNOSIS — N2581 Secondary hyperparathyroidism of renal origin: Secondary | ICD-10-CM | POA: Diagnosis not present

## 2022-05-21 DIAGNOSIS — N250 Renal osteodystrophy: Secondary | ICD-10-CM | POA: Diagnosis not present

## 2022-05-22 DIAGNOSIS — D631 Anemia in chronic kidney disease: Secondary | ICD-10-CM | POA: Diagnosis not present

## 2022-05-22 DIAGNOSIS — N250 Renal osteodystrophy: Secondary | ICD-10-CM | POA: Diagnosis not present

## 2022-05-22 DIAGNOSIS — Z992 Dependence on renal dialysis: Secondary | ICD-10-CM | POA: Diagnosis not present

## 2022-05-22 DIAGNOSIS — N186 End stage renal disease: Secondary | ICD-10-CM | POA: Diagnosis not present

## 2022-05-22 DIAGNOSIS — N2581 Secondary hyperparathyroidism of renal origin: Secondary | ICD-10-CM | POA: Diagnosis not present

## 2022-05-22 DIAGNOSIS — D509 Iron deficiency anemia, unspecified: Secondary | ICD-10-CM | POA: Diagnosis not present

## 2022-05-23 DIAGNOSIS — D509 Iron deficiency anemia, unspecified: Secondary | ICD-10-CM | POA: Diagnosis not present

## 2022-05-23 DIAGNOSIS — N186 End stage renal disease: Secondary | ICD-10-CM | POA: Diagnosis not present

## 2022-05-23 DIAGNOSIS — N2581 Secondary hyperparathyroidism of renal origin: Secondary | ICD-10-CM | POA: Diagnosis not present

## 2022-05-23 DIAGNOSIS — D631 Anemia in chronic kidney disease: Secondary | ICD-10-CM | POA: Diagnosis not present

## 2022-05-23 DIAGNOSIS — N250 Renal osteodystrophy: Secondary | ICD-10-CM | POA: Diagnosis not present

## 2022-05-23 DIAGNOSIS — Z992 Dependence on renal dialysis: Secondary | ICD-10-CM | POA: Diagnosis not present

## 2022-05-24 ENCOUNTER — Ambulatory Visit (INDEPENDENT_AMBULATORY_CARE_PROVIDER_SITE_OTHER): Payer: Medicare Other | Admitting: "Endocrinology

## 2022-05-24 ENCOUNTER — Encounter: Payer: Self-pay | Admitting: "Endocrinology

## 2022-05-24 VITALS — BP 160/86 | HR 92 | Ht 63.0 in | Wt 244.2 lb

## 2022-05-24 DIAGNOSIS — N186 End stage renal disease: Secondary | ICD-10-CM

## 2022-05-24 DIAGNOSIS — M8588 Other specified disorders of bone density and structure, other site: Secondary | ICD-10-CM

## 2022-05-24 DIAGNOSIS — Z992 Dependence on renal dialysis: Secondary | ICD-10-CM | POA: Diagnosis not present

## 2022-05-24 DIAGNOSIS — E049 Nontoxic goiter, unspecified: Secondary | ICD-10-CM | POA: Diagnosis not present

## 2022-05-24 DIAGNOSIS — I2510 Atherosclerotic heart disease of native coronary artery without angina pectoris: Secondary | ICD-10-CM | POA: Diagnosis not present

## 2022-05-24 DIAGNOSIS — R748 Abnormal levels of other serum enzymes: Secondary | ICD-10-CM

## 2022-05-24 MED ORDER — VITAMIN D3 125 MCG (5000 UT) PO CAPS: 5000.0000 [IU] | ORAL_CAPSULE | Freq: Every day | ORAL | 0 refills | Status: DC

## 2022-05-24 NOTE — Progress Notes (Signed)
05/24/2022, 12:50 PM   Endocrinology follow-up note  Subjective:    Patient ID: Patricia [name], female    DOB: 27-Dec-1970, PCP Jake Samples, PA-C   Past Medical History:  Diagnosis Date   Anemia    Ankle fracture    Arthritis    Blood transfusion without reported diagnosis    Breast cancer ([place])    Cancer ([place])    Diabetes mellitus without complication ([place])    Dialysis patient ([place])    mon, wed, friday,    End stage renal disease on dialysis ([place])    M/W/F [facility] in [place]   GERD (gastroesophageal reflux disease)    Hypertension    OSA (obstructive sleep apnea)    uses CPAP sometimes   Pneumonia    PONV (postoperative nausea and vomiting)    Wears glasses    Past Surgical History:  Procedure Laterality Date   ABDOMINAL HYSTERECTOMY     AV FISTULA PLACEMENT  11/2014   at [place] N/A 04/2021   BALLOON DILATION N/A 07/10/2016   Procedure: BALLOON DILATION;  Surgeon: Danie Binder, MD;  [facility];  Service: Endoscopy;  Laterality: N/A;  Pyloric dilation   BREAST LUMPECTOMY     CESAREAN SECTION     CHOLECYSTECTOMY     COLONOSCOPY WITH PROPOFOL N/A 09/27/2016   Dr. Gala Romney: Internal hemorrhoids repeat colonoscopy in 10 years   DILATION AND CURETTAGE OF UTERUS     ESOPHAGOGASTRODUODENOSCOPY N/A 07/10/2016   Dr.Fields- normal esophagus, gastric stenosis was found at the pylorus, gastritis on bx, normal examined duodenun   EXTERNAL FIXATION REMOVAL Right 10/29/2018   Procedure: REMOVAL RIGHT ANKLE BIOMET ZIMMER EXTERNAL FIXATOR, SHORT LEG CAST APPLICATION;  Surgeon: Marybelle Killings, MD;  Location: [place];  Service: Orthopedics;  Laterality: Right;   MASTECTOMY     left sided   ORIF ANKLE FRACTURE Right 10/06/2018   Procedure: OPEN REDUCTION INTERNAL FIXATION (ORIF) RIGHT ANKLE TRIMALLEOLAR;  Surgeon: Marybelle Killings, MD;  Location: [place];  Service: Orthopedics;  Laterality:  Right;   Social History   Socioeconomic History   Marital status: Widowed    Spouse name: Not on file   Number of children: Not on file   Years of education: Not on file   Highest education level: Not on file  Occupational History   Not on file  Tobacco Use   Smoking status: Never   Smokeless tobacco: Never  Vaping Use   Vaping Use: Never used  Substance and Sexual Activity   Alcohol use: No    Alcohol/week: 0.0 standard drinks of alcohol   Drug use: No   Sexual activity: Not on file  Other Topics Concern   Not on file  Social History Narrative   Not on file   Social Determinants of Health   Financial Resource Strain: Not on file  Food Insecurity: Not on file  Transportation Needs: Not on file  Physical Activity: Not on file  Stress: Not on file  Social Connections: Not on file   Family History  Problem Relation Age of Onset   Diabetes Mellitus II Mother    Hypertension Mother    Heart block Mother  Hypertension Sister    Hypertension Sister    Colon cancer Neg Hx    Outpatient Encounter Medications as of 05/24/2022  Medication Sig   Cholecalciferol (VITAMIN D3) 125 MCG (5000 UT) CAPS Take 1 capsule (5,000 Units total) by mouth daily.   acetaminophen (TYLENOL) 325 MG tablet Take 650 mg by mouth every 6 (six) hours as needed.   albuterol (PROVENTIL) (2.5 MG/3ML) 0.083% nebulizer solution Take 3 mLs (2.5 mg total) by nebulization every 6 (six) hours as needed for wheezing or shortness of breath.   aspirin EC 81 MG tablet Take 1 tablet (81 mg total) by mouth daily. Swallow whole.   cetirizine (ZYRTEC) 10 MG tablet Take 10 mg by mouth daily.   cinacalcet (SENSIPAR) 60 MG tablet Take 30 mg by mouth daily with breakfast.   dicyclomine (BENTYL) 10 MG capsule Take 1 capsule (10 mg total) by mouth 2 (two) times daily as needed.   ferric citrate (AURYXIA) 1 GM 210 MG(Fe) tablet Take 2 tablets by mouth 2 (two) times daily with a meal. With Breakfast & with supper    glipiZIDE (GLUCOTROL) 10 MG tablet Take 10 mg by mouth 2 (two) times daily.   ipratropium (ATROVENT) 0.03 % nasal spray Place 2 sprays into both nostrils 2 (two) times daily.    labetalol (NORMODYNE) 100 MG tablet Take 100 mg by mouth 2 (two) times daily.   linaclotide (LINZESS) 290 MCG CAPS capsule Take 290 mcg by mouth daily before breakfast.   montelukast (SINGULAIR) 10 MG tablet Take 1 tablet (10 mg total) by mouth at bedtime.   multivitamin (RENA-VIT) TABS tablet Take 1 tablet by mouth daily.   NIFEdipine (ADALAT CC) 60 MG 24 hr tablet Take 60 mg by mouth daily.   ondansetron (ZOFRAN-ODT) 4 MG disintegrating tablet Take 4 mg by mouth as needed for nausea or vomiting.    pantoprazole (PROTONIX) 40 MG tablet Take 1 tablet (40 mg total) by mouth daily. 30 minutes before breakfast   rOPINIRole (REQUIP XL) 2 MG 24 hr tablet Take 2 mg by mouth at bedtime.    rosuvastatin (CRESTOR) 10 MG tablet Take 1 tablet (10 mg total) by mouth daily.   sevelamer carbonate (RENVELA) 800 MG tablet Take 800 mg by mouth See admin instructions. Take 2 tablets (1600 mg) in the morning, take 3 tablets (2400 mg) with lunch or snack,  and take 2 tablets (1600 mg) by mouth in the evening with dinner meal.   sodium chloride (OCEAN) 0.65 % SOLN nasal spray Place 1 spray into both nostrils as needed for congestion.   SYMBICORT 160-4.5 MCG/ACT inhaler Inhale into the lungs.   No facility-administered encounter medications on file as of 05/24/2022.   ALLERGIES: Allergies  Allergen Reactions   Amlodipine Besylate Rash and Other (See Comments)    dizziness   Reglan [Metoclopramide] Other (See Comments)    hallucinations     VACCINATION STATUS: Immunization History  Administered Date(s) Administered   Influenza Split 09/20/2015   Influenza-Unspecified 09/30/2014, 08/25/2015, 09/06/2016, 08/19/2017, 10/04/2017, 10/15/2018, 09/25/2019, 10/03/2020   Moderna SARS-COV2 Booster Vaccination 11/07/2021   Moderna Sars-Covid-2  Vaccination 03/31/2020, 05/02/2020, 12/16/2020   PPD Test 08/10/2015, 09/06/2015, 02/10/2016, 09/09/2017, 10/20/2018, 11/23/2019, 11/30/2020, 11/08/2021, 12/06/2021   Pneumococcal-Unspecified 03/02/2016, 02/20/2017    HPI Patricia [name] is 51 y.o. female who presents today with a medical history as above. she is being seen in follow-up after she was seen in consultation for nodular goiter requested by Jake Samples, PA-C.  Patient has noticed her  thyroid growing at the beginning of the year.  She underwent thyroid ultrasound on January 04, 2022 which showed significant goiter with right lobe measuring from 1.1 cm and left lobe measuring 4.5 cm.  She was found to have 4.6 cm nodule in the right lobe.  She underwent fine-needle aspiration of the biopsy in March with benign outcomes.   After her last visit, she was sent for thyroid function test to determine thyroid function.  This labs are summarized and consistent with euthyroid presentation.  She denies any prior history of thyroid dysfunction.  She denies dysphagia, shortness of breath, nor voice change.  She denies any family history of thyroid malignancy.  However, patient has personal history of breast cancer, status post lobectomy in 2019. Her other medical problem includes ESRD on hemodialysis.  The cause of  ESRD is said to be hypertension, diabetes.  She has advanced COPD/asthma. Patient is on multiple medications.  Not currently on steroids for  Review of Systems  Constitutional: + She reports fluctuating body weight, + fatigue, no subjective hyperthermia, no subjective hypothermia Eyes: no blurry vision, no xerophthalmia ENT: no sore throat, no nodules palpated in throat, no dysphagia/odynophagia, no hoarseness Cardiovascular: no Chest Pain, no Shortness of Breath, no palpitations, no leg swelling Respiratory: no cough, no shortness of breath Gastrointestinal: no Nausea/Vomiting/Diarhhea Musculoskeletal: no muscle/joint  aches Skin: no rashes Neurological: no tremors, no numbness, no tingling, no dizziness Psychiatric: no depression, no anxiety  Objective:       05/24/2022    9:51 AM 05/17/2022    9:07 AM 05/03/2022    8:37 AM  Vitals with BMI  Height 5\' 3"  5\' 3"  5\' 3"   Weight 244 lbs 3 oz 239 lbs 3 oz 241 lbs 10 oz  BMI 43.27 51.02 58.52  Systolic 778 242 353  Diastolic 86 74 56  Pulse 92 52 90    BP (!) 160/86   Pulse 92   Ht 5\' 3"  (1.6 m)   Wt 244 lb 3.2 oz (110.8 kg)   BMI 43.26 kg/m   Wt Readings from Last 3 Encounters:  05/24/22 244 lb 3.2 oz (110.8 kg)  05/17/22 239 lb 3.2 oz (108.5 kg)  05/03/22 241 lb 9.6 oz (109.6 kg)    Physical Exam  Constitutional:  Body mass index is 43.26 kg/m.,  not in acute distress, normal state of mind Eyes: PERRLA, EOMI, no exophthalmos ENT: moist mucous membranes, + gross thyromegaly, no gross cervical lymphadenopathy   CMP ( most recent) CMP     Component Value Date/Time   NA 139 03/02/2022 1115   K 3.9 03/02/2022 1115   CL 97 (L) 03/02/2022 1115   CO2 26 03/02/2022 1115   GLUCOSE 239 (H) 03/02/2022 1115   BUN 31 (H) 03/02/2022 1115   CREATININE 7.01 (H) 03/02/2022 1115   CREATININE 5.93 (H) 06/29/2019 1210   CALCIUM 9.8 03/02/2022 1115   PROT 7.8 05/17/2022 1607   ALBUMIN 4.9 05/17/2022 1607   AST 17 05/17/2022 1607   ALT 14 05/17/2022 1607   ALKPHOS 146 (H) 05/17/2022 1607   BILITOT 0.4 05/17/2022 1607   GFRNONAA 7 (L) 03/02/2022 1115   GFRNONAA 8 (L) 06/29/2019 1210   GFRAA 4 (L) 08/04/2020 0452   GFRAA 9 (L) 06/29/2019 1210     Diabetic Labs (most recent): Lab Results  Component Value Date   HGBA1C 8.3 (H) 06/26/2021   HGBA1C 8.4 (H) 08/04/2020   HGBA1C 7.3 (H) 12/29/2019     Lipid Panel (  most recent) Lipid Panel     Component Value Date/Time   CHOL 185 05/17/2022 1607   TRIG 58 05/17/2022 1607   HDL 108 05/17/2022 1607   CHOLHDL 1.9 01/04/2022 1003   VLDL 10 01/04/2022 1003   LDLCALC 66 05/17/2022 1607    LABVLDL 11 05/17/2022 1607   Summary of her thyroid ultrasound from January 05, 2022: Right lobe 7.1 cm, 4.6 cm nodule Left lobe 4.5 cm.   Fine-needle aspiration biopsy on February 27, 2022: FINAL MICROSCOPIC DIAGNOSIS:  - Consistent with benign follicular nodule (Bethesda category II)   SPECIMEN ADEQUACY:  Satisfactory for evaluation    Assessment & Plan:   1. Nodular goiter I reviewed her labs with her. Her thyroid function tests are consistent with euthyroid presentation.  She will not need intervention with thyroid hormone nor antithyroid therapy at this time. - she has nodular goiter which was worked up completely including with fine-needle aspiration with benign outcomes.  She will not need surgical intervention at this time.  She does not have any compressive concerns at this time.  Due to the sheer size of her thyroid which is asymmetrically enlarged with 7.1 cm right lobe, 4.5 cm left lobe , she will need surveillance thyroid ultrasound in 1 year.  Review of her labs indicated elevated alkaline phosphatase at 146 on May 17, 2022.  This patient will need bone density in the next subsequent days.  - I did not initiate any new prescriptions today. She has several comorbid conditions including type 2 diabetes, hypertension, ESRD on hemodialysis, hyperlipidemia, COPD/asthma, hyperlipidemia.  - she is advised to maintain close follow up with Jake Samples, PA-C for primary care needs.    I spent 30 minutes in the care of the patient today including review of labs from Thyroid Function, CMP, and other relevant labs ; imaging/biopsy records (current and previous including abstractions from other facilities); face-to-face time discussing  her lab results and symptoms, medications doses, her options of short and [place] term treatment based on the latest standards of care / guidelines;   and documenting the encounter.  Patricia [name]  participated in the discussions, expressed  understanding, and voiced agreement with the above plans.  All questions were answered to her satisfaction. she is encouraged to contact clinic should she have any questions or concerns prior to her return visit.   Follow up plan: Return in about 6 months (around 11/23/2022) for F/U with Pre-visit Labs, DXA Scan B4 NV.   Patricia Lloyd, MD [facility] [address] [place], [place] [address] Phone: [phone]  Fax: [phone]     05/24/2022, 12:50 PM  This note was partially dictated with voice recognition software. Similar sounding words can be transcribed inadequately or may not  be corrected upon review.

## 2022-05-25 DIAGNOSIS — N2581 Secondary hyperparathyroidism of renal origin: Secondary | ICD-10-CM | POA: Diagnosis not present

## 2022-05-25 DIAGNOSIS — D631 Anemia in chronic kidney disease: Secondary | ICD-10-CM | POA: Diagnosis not present

## 2022-05-25 DIAGNOSIS — D509 Iron deficiency anemia, unspecified: Secondary | ICD-10-CM | POA: Diagnosis not present

## 2022-05-25 DIAGNOSIS — N186 End stage renal disease: Secondary | ICD-10-CM | POA: Diagnosis not present

## 2022-05-25 DIAGNOSIS — Z992 Dependence on renal dialysis: Secondary | ICD-10-CM | POA: Diagnosis not present

## 2022-05-25 DIAGNOSIS — N250 Renal osteodystrophy: Secondary | ICD-10-CM | POA: Diagnosis not present

## 2022-05-28 DIAGNOSIS — N186 End stage renal disease: Secondary | ICD-10-CM | POA: Diagnosis not present

## 2022-05-28 DIAGNOSIS — N2581 Secondary hyperparathyroidism of renal origin: Secondary | ICD-10-CM | POA: Diagnosis not present

## 2022-05-28 DIAGNOSIS — D631 Anemia in chronic kidney disease: Secondary | ICD-10-CM | POA: Diagnosis not present

## 2022-05-28 DIAGNOSIS — D509 Iron deficiency anemia, unspecified: Secondary | ICD-10-CM | POA: Diagnosis not present

## 2022-05-28 DIAGNOSIS — Z992 Dependence on renal dialysis: Secondary | ICD-10-CM | POA: Diagnosis not present

## 2022-05-28 DIAGNOSIS — N250 Renal osteodystrophy: Secondary | ICD-10-CM | POA: Diagnosis not present

## 2022-05-30 DIAGNOSIS — N250 Renal osteodystrophy: Secondary | ICD-10-CM | POA: Diagnosis not present

## 2022-05-30 DIAGNOSIS — N186 End stage renal disease: Secondary | ICD-10-CM | POA: Diagnosis not present

## 2022-05-30 DIAGNOSIS — D631 Anemia in chronic kidney disease: Secondary | ICD-10-CM | POA: Diagnosis not present

## 2022-05-30 DIAGNOSIS — N2581 Secondary hyperparathyroidism of renal origin: Secondary | ICD-10-CM | POA: Diagnosis not present

## 2022-05-30 DIAGNOSIS — Z992 Dependence on renal dialysis: Secondary | ICD-10-CM | POA: Diagnosis not present

## 2022-05-30 DIAGNOSIS — D509 Iron deficiency anemia, unspecified: Secondary | ICD-10-CM | POA: Diagnosis not present

## 2022-05-31 DIAGNOSIS — E782 Mixed hyperlipidemia: Secondary | ICD-10-CM | POA: Diagnosis not present

## 2022-05-31 DIAGNOSIS — Z992 Dependence on renal dialysis: Secondary | ICD-10-CM | POA: Diagnosis not present

## 2022-05-31 DIAGNOSIS — I10 Essential (primary) hypertension: Secondary | ICD-10-CM | POA: Diagnosis not present

## 2022-05-31 DIAGNOSIS — C50919 Malignant neoplasm of unspecified site of unspecified female breast: Secondary | ICD-10-CM | POA: Diagnosis not present

## 2022-05-31 DIAGNOSIS — E041 Nontoxic single thyroid nodule: Secondary | ICD-10-CM | POA: Diagnosis not present

## 2022-05-31 DIAGNOSIS — Z9981 Dependence on supplemental oxygen: Secondary | ICD-10-CM | POA: Diagnosis not present

## 2022-05-31 DIAGNOSIS — Z6841 Body Mass Index (BMI) 40.0 and over, adult: Secondary | ICD-10-CM | POA: Diagnosis not present

## 2022-05-31 DIAGNOSIS — N185 Chronic kidney disease, stage 5: Secondary | ICD-10-CM | POA: Diagnosis not present

## 2022-05-31 DIAGNOSIS — G4733 Obstructive sleep apnea (adult) (pediatric): Secondary | ICD-10-CM | POA: Diagnosis not present

## 2022-06-01 DIAGNOSIS — N250 Renal osteodystrophy: Secondary | ICD-10-CM | POA: Diagnosis not present

## 2022-06-01 DIAGNOSIS — N2581 Secondary hyperparathyroidism of renal origin: Secondary | ICD-10-CM | POA: Diagnosis not present

## 2022-06-01 DIAGNOSIS — N186 End stage renal disease: Secondary | ICD-10-CM | POA: Diagnosis not present

## 2022-06-01 DIAGNOSIS — Z992 Dependence on renal dialysis: Secondary | ICD-10-CM | POA: Diagnosis not present

## 2022-06-01 DIAGNOSIS — D509 Iron deficiency anemia, unspecified: Secondary | ICD-10-CM | POA: Diagnosis not present

## 2022-06-01 DIAGNOSIS — D631 Anemia in chronic kidney disease: Secondary | ICD-10-CM | POA: Diagnosis not present

## 2022-06-04 DIAGNOSIS — D509 Iron deficiency anemia, unspecified: Secondary | ICD-10-CM | POA: Diagnosis not present

## 2022-06-04 DIAGNOSIS — N2581 Secondary hyperparathyroidism of renal origin: Secondary | ICD-10-CM | POA: Diagnosis not present

## 2022-06-04 DIAGNOSIS — Z992 Dependence on renal dialysis: Secondary | ICD-10-CM | POA: Diagnosis not present

## 2022-06-04 DIAGNOSIS — D631 Anemia in chronic kidney disease: Secondary | ICD-10-CM | POA: Diagnosis not present

## 2022-06-04 DIAGNOSIS — N186 End stage renal disease: Secondary | ICD-10-CM | POA: Diagnosis not present

## 2022-06-04 DIAGNOSIS — N250 Renal osteodystrophy: Secondary | ICD-10-CM | POA: Diagnosis not present

## 2022-06-06 DIAGNOSIS — D631 Anemia in chronic kidney disease: Secondary | ICD-10-CM | POA: Diagnosis not present

## 2022-06-06 DIAGNOSIS — N2581 Secondary hyperparathyroidism of renal origin: Secondary | ICD-10-CM | POA: Diagnosis not present

## 2022-06-06 DIAGNOSIS — N186 End stage renal disease: Secondary | ICD-10-CM | POA: Diagnosis not present

## 2022-06-06 DIAGNOSIS — N250 Renal osteodystrophy: Secondary | ICD-10-CM | POA: Diagnosis not present

## 2022-06-06 DIAGNOSIS — Z992 Dependence on renal dialysis: Secondary | ICD-10-CM | POA: Diagnosis not present

## 2022-06-06 DIAGNOSIS — D509 Iron deficiency anemia, unspecified: Secondary | ICD-10-CM | POA: Diagnosis not present

## 2022-06-08 DIAGNOSIS — Z992 Dependence on renal dialysis: Secondary | ICD-10-CM | POA: Diagnosis not present

## 2022-06-08 DIAGNOSIS — N250 Renal osteodystrophy: Secondary | ICD-10-CM | POA: Diagnosis not present

## 2022-06-08 DIAGNOSIS — N2581 Secondary hyperparathyroidism of renal origin: Secondary | ICD-10-CM | POA: Diagnosis not present

## 2022-06-08 DIAGNOSIS — D631 Anemia in chronic kidney disease: Secondary | ICD-10-CM | POA: Diagnosis not present

## 2022-06-08 DIAGNOSIS — N186 End stage renal disease: Secondary | ICD-10-CM | POA: Diagnosis not present

## 2022-06-08 DIAGNOSIS — D509 Iron deficiency anemia, unspecified: Secondary | ICD-10-CM | POA: Diagnosis not present

## 2022-06-11 DIAGNOSIS — Z992 Dependence on renal dialysis: Secondary | ICD-10-CM | POA: Diagnosis not present

## 2022-06-11 DIAGNOSIS — D509 Iron deficiency anemia, unspecified: Secondary | ICD-10-CM | POA: Diagnosis not present

## 2022-06-11 DIAGNOSIS — N186 End stage renal disease: Secondary | ICD-10-CM | POA: Diagnosis not present

## 2022-06-11 DIAGNOSIS — D631 Anemia in chronic kidney disease: Secondary | ICD-10-CM | POA: Diagnosis not present

## 2022-06-11 DIAGNOSIS — N2581 Secondary hyperparathyroidism of renal origin: Secondary | ICD-10-CM | POA: Diagnosis not present

## 2022-06-11 DIAGNOSIS — N250 Renal osteodystrophy: Secondary | ICD-10-CM | POA: Diagnosis not present

## 2022-06-13 DIAGNOSIS — N250 Renal osteodystrophy: Secondary | ICD-10-CM | POA: Diagnosis not present

## 2022-06-13 DIAGNOSIS — N2581 Secondary hyperparathyroidism of renal origin: Secondary | ICD-10-CM | POA: Diagnosis not present

## 2022-06-13 DIAGNOSIS — Z992 Dependence on renal dialysis: Secondary | ICD-10-CM | POA: Diagnosis not present

## 2022-06-13 DIAGNOSIS — D509 Iron deficiency anemia, unspecified: Secondary | ICD-10-CM | POA: Diagnosis not present

## 2022-06-13 DIAGNOSIS — D631 Anemia in chronic kidney disease: Secondary | ICD-10-CM | POA: Diagnosis not present

## 2022-06-13 DIAGNOSIS — N186 End stage renal disease: Secondary | ICD-10-CM | POA: Diagnosis not present

## 2022-06-14 ENCOUNTER — Encounter: Payer: Self-pay | Admitting: Internal Medicine

## 2022-06-15 DIAGNOSIS — D509 Iron deficiency anemia, unspecified: Secondary | ICD-10-CM | POA: Diagnosis not present

## 2022-06-15 DIAGNOSIS — N2581 Secondary hyperparathyroidism of renal origin: Secondary | ICD-10-CM | POA: Diagnosis not present

## 2022-06-15 DIAGNOSIS — N250 Renal osteodystrophy: Secondary | ICD-10-CM | POA: Diagnosis not present

## 2022-06-15 DIAGNOSIS — N186 End stage renal disease: Secondary | ICD-10-CM | POA: Diagnosis not present

## 2022-06-15 DIAGNOSIS — Z992 Dependence on renal dialysis: Secondary | ICD-10-CM | POA: Diagnosis not present

## 2022-06-15 DIAGNOSIS — D631 Anemia in chronic kidney disease: Secondary | ICD-10-CM | POA: Diagnosis not present

## 2022-06-16 DIAGNOSIS — N186 End stage renal disease: Secondary | ICD-10-CM | POA: Diagnosis not present

## 2022-06-16 DIAGNOSIS — N250 Renal osteodystrophy: Secondary | ICD-10-CM | POA: Diagnosis not present

## 2022-06-16 DIAGNOSIS — N2581 Secondary hyperparathyroidism of renal origin: Secondary | ICD-10-CM | POA: Diagnosis not present

## 2022-06-16 DIAGNOSIS — D631 Anemia in chronic kidney disease: Secondary | ICD-10-CM | POA: Diagnosis not present

## 2022-06-16 DIAGNOSIS — D509 Iron deficiency anemia, unspecified: Secondary | ICD-10-CM | POA: Diagnosis not present

## 2022-06-16 DIAGNOSIS — Z992 Dependence on renal dialysis: Secondary | ICD-10-CM | POA: Diagnosis not present

## 2022-06-18 DIAGNOSIS — D509 Iron deficiency anemia, unspecified: Secondary | ICD-10-CM | POA: Diagnosis not present

## 2022-06-18 DIAGNOSIS — N186 End stage renal disease: Secondary | ICD-10-CM | POA: Diagnosis not present

## 2022-06-18 DIAGNOSIS — N250 Renal osteodystrophy: Secondary | ICD-10-CM | POA: Diagnosis not present

## 2022-06-18 DIAGNOSIS — N2581 Secondary hyperparathyroidism of renal origin: Secondary | ICD-10-CM | POA: Diagnosis not present

## 2022-06-18 DIAGNOSIS — D631 Anemia in chronic kidney disease: Secondary | ICD-10-CM | POA: Diagnosis not present

## 2022-06-18 DIAGNOSIS — Z992 Dependence on renal dialysis: Secondary | ICD-10-CM | POA: Diagnosis not present

## 2022-06-20 DIAGNOSIS — N186 End stage renal disease: Secondary | ICD-10-CM | POA: Diagnosis not present

## 2022-06-20 DIAGNOSIS — Z992 Dependence on renal dialysis: Secondary | ICD-10-CM | POA: Diagnosis not present

## 2022-06-20 DIAGNOSIS — N2581 Secondary hyperparathyroidism of renal origin: Secondary | ICD-10-CM | POA: Diagnosis not present

## 2022-06-20 DIAGNOSIS — D631 Anemia in chronic kidney disease: Secondary | ICD-10-CM | POA: Diagnosis not present

## 2022-06-20 DIAGNOSIS — D509 Iron deficiency anemia, unspecified: Secondary | ICD-10-CM | POA: Diagnosis not present

## 2022-06-20 DIAGNOSIS — N250 Renal osteodystrophy: Secondary | ICD-10-CM | POA: Diagnosis not present

## 2022-06-21 ENCOUNTER — Ambulatory Visit (INDEPENDENT_AMBULATORY_CARE_PROVIDER_SITE_OTHER): Payer: Medicare Other | Admitting: Orthopaedic Surgery

## 2022-06-21 ENCOUNTER — Ambulatory Visit (INDEPENDENT_AMBULATORY_CARE_PROVIDER_SITE_OTHER): Payer: Medicare Other

## 2022-06-21 VITALS — Ht 63.0 in | Wt 245.0 lb

## 2022-06-21 DIAGNOSIS — D631 Anemia in chronic kidney disease: Secondary | ICD-10-CM | POA: Diagnosis not present

## 2022-06-21 DIAGNOSIS — I2510 Atherosclerotic heart disease of native coronary artery without angina pectoris: Secondary | ICD-10-CM | POA: Diagnosis not present

## 2022-06-21 DIAGNOSIS — N250 Renal osteodystrophy: Secondary | ICD-10-CM | POA: Diagnosis not present

## 2022-06-21 DIAGNOSIS — N186 End stage renal disease: Secondary | ICD-10-CM | POA: Diagnosis not present

## 2022-06-21 DIAGNOSIS — Z992 Dependence on renal dialysis: Secondary | ICD-10-CM | POA: Diagnosis not present

## 2022-06-21 DIAGNOSIS — M25571 Pain in right ankle and joints of right foot: Secondary | ICD-10-CM

## 2022-06-21 DIAGNOSIS — N2581 Secondary hyperparathyroidism of renal origin: Secondary | ICD-10-CM | POA: Diagnosis not present

## 2022-06-21 DIAGNOSIS — D509 Iron deficiency anemia, unspecified: Secondary | ICD-10-CM | POA: Diagnosis not present

## 2022-06-21 NOTE — Progress Notes (Signed)
Office Visit Note   Patient: Patricia [name]           Date of Birth: November 13, 1971           MRN: [id number] Visit Date: 06/21/2022              Requested by: Jake Samples, PA-C [address] [place],  [place] [address] PCP: Jake Samples, PA-C   Assessment & Plan: Visit Diagnoses:  1. Pain in right ankle and joints of right foot     Plan: Attempted aspiration was unsuccessful for any fluid.  Unable to squeeze any fluid out.  Patient may may have some peroneal tendinopathy or increased fluid swelling through the fascial defect.  She has an ankle brace she can use intermittently if she runs any fever or chills she will call us promptly.  No evidence of cellulitis incision is nicely healed.  Recheck 8 weeks.  X-rays show trimalleolar ankle fracture is completely healed.  Follow-Up Instructions: Return in about 8 weeks (around 08/16/2022).   Orders:  Orders Placed This Encounter  Procedures   XR Ankle Complete Right   No orders of the defined types were placed in this encounter.     Procedures: No procedures performed   Clinical Data: No additional findings.   Subjective: Chief Complaint  Patient presents with   Right Ankle - Pain    HPI 51 year old female seen with 2-week history of tender swelling proximal portion of her fibular incision.  Patient had a trimalleolar ankle fracture fixed by me 10/06/2018.  She had an external fixator applied and since that surgery she has had progressive problems with the kidney and is on dialysis.  She states a couple weeks ago whenever she had dialysis or propped her foot up the swelling went down.  She denies fever or chills.  No pain in her ankle.  No tenderness at the distal aspect of the incision.  Negative for fever chills no drainage from her incision.  Review of Systems positive for type 2 diabetes sleep apnea, obesity, history of heart failure not currently symptomatic.  Renal failure on dialysis.  All other systems  noncontributory HPI.   Objective: Vital Signs: Ht 5\' 3"  (1.6 m)   Wt 245 lb (111.1 kg)   BMI 43.40 kg/m   Physical Exam Constitutional:      Appearance: She is well-developed.  HENT:     Head: Normocephalic.     Right Ear: External ear normal.     Left Ear: External ear normal. There is no impacted cerumen.  Eyes:     Pupils: Pupils are equal, round, and reactive to light.  Neck:     Thyroid: No thyromegaly.     Trachea: No tracheal deviation.  Cardiovascular:     Rate and Rhythm: Normal rate.  Pulmonary:     Effort: Pulmonary effort is normal.  Abdominal:     Palpations: Abdomen is soft.  Musculoskeletal:     Cervical back: No rigidity.  Skin:    General: Skin is warm and dry.  Neurological:     Mental Status: She is alert and oriented to person, place, and time.  Psychiatric:        Behavior: Behavior normal.     Ortho Exam 4 x 2 cm area of swelling ballotable proximal portion of the fibular incision slightly anterior.  No distal peroneal tendon tenderness no plantar foot lesions.  No ankle effusion.  Specialty Comments:  No specialty comments available.  Imaging: XR  Ankle Complete Right  Result Date: 06/21/2022 Three-view x-rays right ankle obtained and reviewed comparison 2019 images.  Trimalleolar ankle fracture fixation with complete healing of the medial lateral malleolus and posterior malleolus.  There is mild joint space narrowing with subchondral cyst consistent with possibly some early posttraumatic arthritis.  Soft tissue swelling with the BB on the skin proximal portion of the incision slightly anterior to the plate without calcification and swelling area measures 1.7 x 44 mm. Impression: Post-ORIF trimalleolar ankle fracture with some early posttraumatic osteoarthritis.  Soft tissue swelling anterior over the proximal portion of the plate.    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   Elevated  alkaline phosphatase level 05/24/2022   Right foot sprain 12/20/2020   Contusion of left shoulder 12/20/2020   Syncope 08/03/2020   Acute pulmonary edema ([place]) 01/11/2020   OSA (obstructive sleep apnea)    Closed right ankle fracture 10/06/2018   Closed displaced trimalleolar fracture of right ankle 10/06/2018   Acute colitis 02/26/2018   Hypoxemia 02/24/2018   Dyspnea 11/26/2017   Anemia 11/26/2017   Elevated troponin 11/26/2017   Hyperkalemia 09/29/2017   Acute respiratory failure ([place]) 09/28/2017   CAP (community acquired pneumonia) 09/26/2017   Volume overload 09/25/2017   Acute on chronic diastolic CHF (congestive heart failure) ([facility]) 09/25/2017   Lactic acidosis 09/25/2017   Hypokalemia 09/25/2017   Breast cancer ([facility]) 07/22/2017   SOB (shortness of breath)    Acute respiratory failure with hypoxia ([facility]) 07/21/2017   Flatulence 10/25/2016   GERD (gastroesophageal reflux disease) 08/02/2016   Pyloric stenosis, acquired    Nausea with vomiting    Generalized abdominal pain    Chest pain, rule out acute myocardial infarction 07/04/2016   Constipation 07/04/2016   Nausea 07/04/2016   Essential hypertension 07/04/2016   HCAP (healthcare-associated pneumonia) 12/04/2015   ESRD on dialysis ([place]) 12/04/2015   Type 2 diabetes mellitus ([facility]) 12/04/2015   Past Medical History:  Diagnosis Date   Anemia    Ankle fracture    Arthritis    Blood transfusion without reported diagnosis    Breast cancer ([place])    Cancer ([place])    Diabetes mellitus without complication ([facility])    Dialysis patient ([facility])    mon, wed, friday,    End stage renal disease on dialysis ([place] [facility])    M/W/F [facility] in [place]   GERD (gastroesophageal reflux disease)    Hypertension    OSA (obstructive sleep apnea)    uses CPAP sometimes   Pneumonia    PONV (postoperative nausea and vomiting)    Wears glasses     Family History  Problem Relation Age of Onset   Diabetes Mellitus II Mother    Hypertension  Mother    Heart block Mother    Hypertension Sister    Hypertension Sister    Colon cancer Neg Hx     Past Surgical History:  Procedure Laterality Date   ABDOMINAL HYSTERECTOMY     AV FISTULA PLACEMENT  11/2014   at [facility] N/A 04/2021   BALLOON DILATION N/A 07/10/2016   Procedure: BALLOON DILATION;  Surgeon: Danie Binder, MD;  [facility];  Service: Endoscopy;  Laterality: N/A;  Pyloric dilation   BREAST LUMPECTOMY     CESAREAN SECTION     CHOLECYSTECTOMY     COLONOSCOPY WITH PROPOFOL N/A 09/27/2016   Dr. Gala Romney: Internal hemorrhoids repeat colonoscopy in  10 years   DILATION AND CURETTAGE OF UTERUS     ESOPHAGOGASTRODUODENOSCOPY N/A 07/10/2016   Dr.Fields- normal esophagus, gastric stenosis was found at the pylorus, gastritis on bx, normal examined duodenun   EXTERNAL FIXATION REMOVAL Right 10/29/2018   Procedure: REMOVAL RIGHT ANKLE BIOMET ZIMMER EXTERNAL FIXATOR, SHORT LEG CAST APPLICATION;  Surgeon: Marybelle Killings, MD;  Location: [facility];  Service: Orthopedics;  Laterality: Right;   MASTECTOMY     left sided   ORIF ANKLE FRACTURE Right 10/06/2018   Procedure: OPEN REDUCTION INTERNAL FIXATION (ORIF) RIGHT ANKLE TRIMALLEOLAR;  Surgeon: Marybelle Killings, MD;  Location: [facility];  Service: Orthopedics;  Laterality: Right;   Social History   Occupational History   Not on file  Tobacco Use   Smoking status: Never   Smokeless tobacco: Never  Vaping Use   Vaping Use: Never used  Substance and Sexual Activity   Alcohol use: No    Alcohol/week: 0.0 standard drinks of alcohol   Drug use: No   Sexual activity: Not on file

## 2022-06-22 DIAGNOSIS — D509 Iron deficiency anemia, unspecified: Secondary | ICD-10-CM | POA: Diagnosis not present

## 2022-06-22 DIAGNOSIS — N186 End stage renal disease: Secondary | ICD-10-CM | POA: Diagnosis not present

## 2022-06-22 DIAGNOSIS — N250 Renal osteodystrophy: Secondary | ICD-10-CM | POA: Diagnosis not present

## 2022-06-22 DIAGNOSIS — D631 Anemia in chronic kidney disease: Secondary | ICD-10-CM | POA: Diagnosis not present

## 2022-06-22 DIAGNOSIS — Z992 Dependence on renal dialysis: Secondary | ICD-10-CM | POA: Diagnosis not present

## 2022-06-22 DIAGNOSIS — N2581 Secondary hyperparathyroidism of renal origin: Secondary | ICD-10-CM | POA: Diagnosis not present

## 2022-06-25 DIAGNOSIS — Z992 Dependence on renal dialysis: Secondary | ICD-10-CM | POA: Diagnosis not present

## 2022-06-25 DIAGNOSIS — N186 End stage renal disease: Secondary | ICD-10-CM | POA: Diagnosis not present

## 2022-06-25 DIAGNOSIS — D509 Iron deficiency anemia, unspecified: Secondary | ICD-10-CM | POA: Diagnosis not present

## 2022-06-25 DIAGNOSIS — D631 Anemia in chronic kidney disease: Secondary | ICD-10-CM | POA: Diagnosis not present

## 2022-06-25 DIAGNOSIS — N250 Renal osteodystrophy: Secondary | ICD-10-CM | POA: Diagnosis not present

## 2022-06-25 DIAGNOSIS — N2581 Secondary hyperparathyroidism of renal origin: Secondary | ICD-10-CM | POA: Diagnosis not present

## 2022-06-25 IMAGING — DX DG CHEST 1V PORT
1 series · 1 of 1 positions shown · non-contrast
Comparison: 08/03/2020

CLINICAL DATA: Shortness of breath

EXAM:
PORTABLE CHEST 1 VIEW

[chest ap]
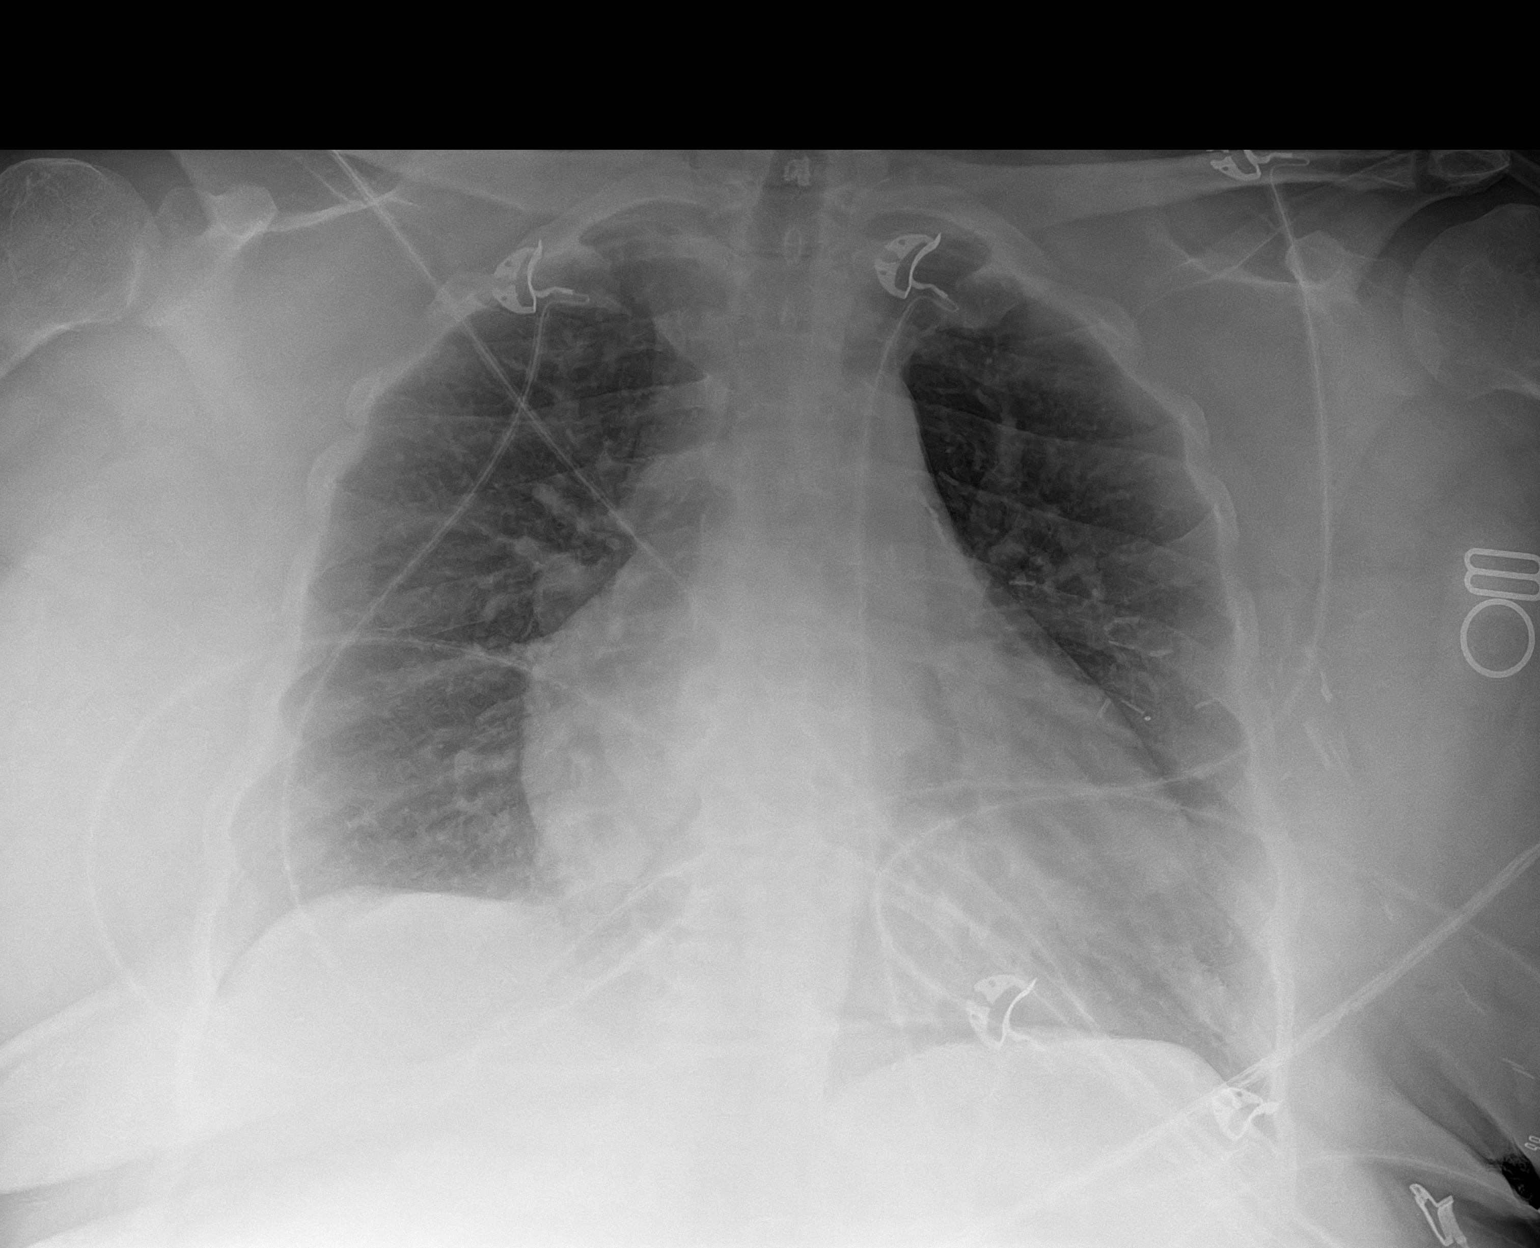

[1 of 1 positions shown; findings below may reference images not displayed]

FINDINGS: Mild pulmonary vascular congestion. No pleural effusion. No
pneumothorax. Cardiomegaly.
IMPRESSION: Pulmonary vascular congestion.  Cardiomegaly.

## 2022-06-26 ENCOUNTER — Other Ambulatory Visit: Payer: Self-pay | Admitting: Pulmonary Disease

## 2022-06-27 DIAGNOSIS — D631 Anemia in chronic kidney disease: Secondary | ICD-10-CM | POA: Diagnosis not present

## 2022-06-27 DIAGNOSIS — Z992 Dependence on renal dialysis: Secondary | ICD-10-CM | POA: Diagnosis not present

## 2022-06-27 DIAGNOSIS — N2581 Secondary hyperparathyroidism of renal origin: Secondary | ICD-10-CM | POA: Diagnosis not present

## 2022-06-27 DIAGNOSIS — N250 Renal osteodystrophy: Secondary | ICD-10-CM | POA: Diagnosis not present

## 2022-06-27 DIAGNOSIS — N186 End stage renal disease: Secondary | ICD-10-CM | POA: Diagnosis not present

## 2022-06-27 DIAGNOSIS — D509 Iron deficiency anemia, unspecified: Secondary | ICD-10-CM | POA: Diagnosis not present

## 2022-06-28 ENCOUNTER — Encounter: Payer: Self-pay | Admitting: Pulmonary Disease

## 2022-06-28 ENCOUNTER — Ambulatory Visit (INDEPENDENT_AMBULATORY_CARE_PROVIDER_SITE_OTHER): Payer: Medicare Other | Admitting: Pulmonary Disease

## 2022-06-28 VITALS — BP 140/88 | HR 90 | Temp 98.3°F | Ht 63.0 in | Wt 242.8 lb

## 2022-06-28 DIAGNOSIS — I2510 Atherosclerotic heart disease of native coronary artery without angina pectoris: Secondary | ICD-10-CM | POA: Diagnosis not present

## 2022-06-28 DIAGNOSIS — J42 Unspecified chronic bronchitis: Secondary | ICD-10-CM | POA: Diagnosis not present

## 2022-06-28 DIAGNOSIS — J9611 Chronic respiratory failure with hypoxia: Secondary | ICD-10-CM | POA: Diagnosis not present

## 2022-06-28 DIAGNOSIS — J849 Interstitial pulmonary disease, unspecified: Secondary | ICD-10-CM | POA: Diagnosis not present

## 2022-06-28 MED ORDER — ALBUTEROL SULFATE HFA 108 (90 BASE) MCG/ACT IN AERS: 2.0000 | INHALATION_SPRAY | Freq: Four times a day (QID) | RESPIRATORY_TRACT | 5 refills | Status: DC | PRN

## 2022-06-28 NOTE — Progress Notes (Signed)
[facility]  Chief Complaint  Patient presents with   Follow-up    Patient has not been using cpap since January 2023. Has been sleeping with Oxygen     Constitutional:  BP 140/88 (BP Location: Right Wrist, Patient Position: Sitting)   Pulse 90   Temp 98.3 F (36.8 C) (Temporal)   Ht 5' 3"  (1.6 m)   Wt 242 lb 12.8 oz (110.1 kg)   SpO2 92% Comment: ra  BMI 43.01 kg/m   Past Medical History:  Anemia, OA, Breast cancer, DM, ESRD on iHD, GERD, HTN, COVID pneumonia January 2021  Past Surgical History:  She  has a past surgical history that includes Cholecystectomy; Cesarean section; Dilation and curettage of uterus; Abdominal hysterectomy; AV fistula placement (11/2014); Esophagogastroduodenoscopy (N/A, 07/10/2016); Balloon dilation (N/A, 07/10/2016); Colonoscopy with propofol (N/A, 09/27/2016); Mastectomy; Breast lumpectomy; ORIF ankle fracture (Right, 10/06/2018); External fixation removal (Right, 10/29/2018); and AV fistula repair (N/A, 04/2021).  Brief Summary:  Patricia [name] is a 51 y.o. 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  She had viral respiratory infection in May 2022 and developed symptoms after this.      Subjective:   She had thyroid biopsy.  Results were benign.  Followed by endocrinology.    Has sinus congestion and sneezing in the morning.  Okay later in the day.  She has been using oxygen at night and with exertion during the day.  She feels this works better for her at night than CPAP.  Her SpO2 on room air at home can get into the upper 80's when she is walking.  Her breathing gets rough in hot weather.   Physical Exam:   Appearance - well kempt   ENMT - no sinus tenderness, no oral exudate, no LAN, Mallampati 4 airway, no stridor  Respiratory - equal breath sounds bilaterally, no wheezing or rales  CV - s1s2 regular rate and rhythm, no  murmurs  Ext - no clubbing, no edema, AV graft in Lt upper arm  Skin - no rashes  Psych - normal mood and affect      Pulmonary testing:  PFT 01/09/18 >> FEV1 1.60 (70%), FEV1% 89, TLC 3.26 (66%), DLCO 55% PFT 10/17/21 >> FEV1 1.28 (57%), FEV1% 89, TLC 3.28 (66%), DLCO 27% Serology 10/31/21 >> ANCA, RF negative; ESR 11  Chest Imaging:  CT angio chest 06/26/21 >> mosaic attenuation of the airspaces, bandlike scarring at bases HRCT chest 10/11/21 >> mild, mosaic attenuation of airspaces, mild air trapping, scarring at bases CT angio chest 11/25/21 >> diffuse b/l GGO and consolidation concerning for pneumonia  Cardiac Tests:  Echo 06/27/21 >> EF 55 to 60%, mild LVH, grade 2 DD, RVSP 29.6 mmHg, mild MR  Social History:  She  reports that she has never smoked. She has never used smokeless tobacco. She reports that she does not drink alcohol and does not use drugs.  Family History:  Her family history includes Diabetes Mellitus II in her mother; Heart block in her mother; Hypertension in her mother, sister, and sister.     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 - intolerant of breztri - continue symbicort, signulair - prn albuterol - she has a nebulizer machine - will arrange for follow up PFT and HRCT chest in November 2023  Chronic respiratory failure with hypoxia. - 2 liters oxygen with exertion and sleep - gets supplies from [employer]  Obstructive sleep apnea. - she is intolerant of CPAP  Obesity. - discussed importance of weight loss  ESRD on iHD. - followed by [facility]  Coronary artery disease, Chronic diastolic CHF. - followed by Dr. Dorris Carnes with cardiology  Thyroid nodule. - followed by Dr. Dorris Fetch with Endocrinology  Time Spent Involved in Patient Care on Day of Examination:  38 minutes  Follow up:   Patient Instructions  Will schedule CT chest and pulmonary function test at  [facility] for November 2023 and follow up after that  Medication List:   Allergies as of 06/28/2022       Reactions   Amlodipine Besylate Rash, Other (See Comments)   dizziness   Reglan [metoclopramide] Other (See Comments)   hallucinations        Medication List        Accurate as of June 28, 2022  9:54 AM. If you have any questions, ask your nurse or doctor.          acetaminophen 325 MG tablet Commonly known as: TYLENOL Take 650 mg by mouth every 6 (six) hours as needed.   albuterol (2.5 MG/3ML) 0.083% nebulizer solution Commonly known as: PROVENTIL Take 3 mLs (2.5 mg total) by nebulization every 6 (six) hours as needed for wheezing or shortness of breath. What changed: Another medication with the same name was added. Make sure you understand how and when to take each. Changed by: Chesley Mires, MD   albuterol 108 (90 Base) MCG/ACT inhaler Commonly known as: Ventolin HFA Inhale 2 puffs into the lungs every 6 (six) hours as needed for wheezing or shortness of breath. What changed: You were already taking a medication with the same name, and this prescription was added. Make sure you understand how and when to take each. Changed by: Chesley Mires, MD   aspirin EC 81 MG tablet Take 1 tablet (81 mg total) by mouth daily. Swallow whole.   cetirizine 10 MG tablet Commonly known as: ZYRTEC Take 10 mg by mouth daily.   cinacalcet 60 MG tablet Commonly known as: SENSIPAR Take 30 mg by mouth daily with breakfast.   dicyclomine 10 MG capsule Commonly known as: BENTYL Take 1 capsule (10 mg total) by mouth 2 (two) times daily as needed.   ferric citrate 1 GM 210 MG(Fe) tablet Commonly known as: AURYXIA Take 2 tablets by mouth 2 (two) times daily with a meal. With Breakfast & with supper   glipiZIDE 10 MG tablet Commonly known as: GLUCOTROL Take 10 mg by mouth 2 (two) times daily.   ipratropium 0.03 % nasal spray Commonly known as: ATROVENT Place 2 sprays  into both nostrils 2 (two) times daily.   labetalol 100 MG tablet Commonly known as: NORMODYNE Take 100 mg by mouth 2 (two) times daily.   linaclotide 290 MCG Caps capsule Commonly known as: LINZESS Take 290 mcg by mouth daily before breakfast.   montelukast 10 MG tablet Commonly known as: SINGULAIR Take 1 tablet (10 mg total) by mouth at bedtime.   multivitamin Tabs tablet Take 1 tablet by mouth daily.   NIFEdipine 60 MG 24 hr tablet Commonly known as: ADALAT CC Take 60 mg by mouth daily.   ondansetron 4 MG disintegrating tablet Commonly known as: ZOFRAN-ODT Take 4 mg by mouth as needed for nausea or vomiting.   pantoprazole 40 MG tablet Commonly known as: PROTONIX Take 1 tablet (40 mg total) by mouth daily. 30 minutes before breakfast   rOPINIRole 2 MG  24 hr tablet Commonly known as: REQUIP XL Take 2 mg by mouth at bedtime.   rosuvastatin 10 MG tablet Commonly known as: CRESTOR Take 1 tablet (10 mg total) by mouth daily.   sevelamer carbonate 800 MG tablet Commonly known as: RENVELA Take 800 mg by mouth See admin instructions. Take 2 tablets (1600 mg) in the morning, take 3 tablets (2400 mg) with lunch or snack,  and take 2 tablets (1600 mg) by mouth in the evening with dinner meal.   sodium chloride 0.65 % Soln nasal spray Commonly known as: OCEAN Place 1 spray into both nostrils as needed for congestion.   Symbicort 160-4.5 MCG/ACT inhaler Generic drug: budesonide-formoterol Inhale into the lungs.   VITAMIN D PO Take by mouth.   Vitamin D3 125 MCG (5000 UT) Caps Take 1 capsule (5,000 Units total) by mouth daily.        Signature:  Chesley Mires, MD [place] Pager - [phone] 06/28/2022, 9:54 AM

## 2022-06-28 NOTE — Patient Instructions (Signed)
Will schedule CT chest and pulmonary function test at [facility] for November 2023 and follow up after that

## 2022-06-29 DIAGNOSIS — N2581 Secondary hyperparathyroidism of renal origin: Secondary | ICD-10-CM | POA: Diagnosis not present

## 2022-06-29 DIAGNOSIS — D509 Iron deficiency anemia, unspecified: Secondary | ICD-10-CM | POA: Diagnosis not present

## 2022-06-29 DIAGNOSIS — D631 Anemia in chronic kidney disease: Secondary | ICD-10-CM | POA: Diagnosis not present

## 2022-06-29 DIAGNOSIS — N186 End stage renal disease: Secondary | ICD-10-CM | POA: Diagnosis not present

## 2022-06-29 DIAGNOSIS — N250 Renal osteodystrophy: Secondary | ICD-10-CM | POA: Diagnosis not present

## 2022-06-29 DIAGNOSIS — Z992 Dependence on renal dialysis: Secondary | ICD-10-CM | POA: Diagnosis not present

## 2022-07-02 DIAGNOSIS — N2581 Secondary hyperparathyroidism of renal origin: Secondary | ICD-10-CM | POA: Diagnosis not present

## 2022-07-02 DIAGNOSIS — N186 End stage renal disease: Secondary | ICD-10-CM | POA: Diagnosis not present

## 2022-07-02 DIAGNOSIS — N250 Renal osteodystrophy: Secondary | ICD-10-CM | POA: Diagnosis not present

## 2022-07-02 DIAGNOSIS — D631 Anemia in chronic kidney disease: Secondary | ICD-10-CM | POA: Diagnosis not present

## 2022-07-02 DIAGNOSIS — Z992 Dependence on renal dialysis: Secondary | ICD-10-CM | POA: Diagnosis not present

## 2022-07-02 DIAGNOSIS — D509 Iron deficiency anemia, unspecified: Secondary | ICD-10-CM | POA: Diagnosis not present

## 2022-07-04 DIAGNOSIS — N2581 Secondary hyperparathyroidism of renal origin: Secondary | ICD-10-CM | POA: Diagnosis not present

## 2022-07-04 DIAGNOSIS — D631 Anemia in chronic kidney disease: Secondary | ICD-10-CM | POA: Diagnosis not present

## 2022-07-04 DIAGNOSIS — Z992 Dependence on renal dialysis: Secondary | ICD-10-CM | POA: Diagnosis not present

## 2022-07-04 DIAGNOSIS — N186 End stage renal disease: Secondary | ICD-10-CM | POA: Diagnosis not present

## 2022-07-04 DIAGNOSIS — N250 Renal osteodystrophy: Secondary | ICD-10-CM | POA: Diagnosis not present

## 2022-07-04 DIAGNOSIS — D509 Iron deficiency anemia, unspecified: Secondary | ICD-10-CM | POA: Diagnosis not present

## 2022-07-06 DIAGNOSIS — D631 Anemia in chronic kidney disease: Secondary | ICD-10-CM | POA: Diagnosis not present

## 2022-07-06 DIAGNOSIS — N2581 Secondary hyperparathyroidism of renal origin: Secondary | ICD-10-CM | POA: Diagnosis not present

## 2022-07-06 DIAGNOSIS — N186 End stage renal disease: Secondary | ICD-10-CM | POA: Diagnosis not present

## 2022-07-06 DIAGNOSIS — D509 Iron deficiency anemia, unspecified: Secondary | ICD-10-CM | POA: Diagnosis not present

## 2022-07-06 DIAGNOSIS — N250 Renal osteodystrophy: Secondary | ICD-10-CM | POA: Diagnosis not present

## 2022-07-06 DIAGNOSIS — Z992 Dependence on renal dialysis: Secondary | ICD-10-CM | POA: Diagnosis not present

## 2022-07-09 ENCOUNTER — Other Ambulatory Visit: Payer: Self-pay | Admitting: Pulmonary Disease

## 2022-07-09 DIAGNOSIS — N250 Renal osteodystrophy: Secondary | ICD-10-CM | POA: Diagnosis not present

## 2022-07-09 DIAGNOSIS — Z992 Dependence on renal dialysis: Secondary | ICD-10-CM | POA: Diagnosis not present

## 2022-07-09 DIAGNOSIS — N186 End stage renal disease: Secondary | ICD-10-CM | POA: Diagnosis not present

## 2022-07-09 DIAGNOSIS — D509 Iron deficiency anemia, unspecified: Secondary | ICD-10-CM | POA: Diagnosis not present

## 2022-07-09 DIAGNOSIS — N2581 Secondary hyperparathyroidism of renal origin: Secondary | ICD-10-CM | POA: Diagnosis not present

## 2022-07-09 DIAGNOSIS — J219 Acute bronchiolitis, unspecified: Secondary | ICD-10-CM

## 2022-07-09 DIAGNOSIS — D631 Anemia in chronic kidney disease: Secondary | ICD-10-CM | POA: Diagnosis not present

## 2022-07-10 DIAGNOSIS — H6522 Chronic serous otitis media, left ear: Secondary | ICD-10-CM | POA: Diagnosis not present

## 2022-07-10 DIAGNOSIS — H6982 Other specified disorders of Eustachian tube, left ear: Secondary | ICD-10-CM | POA: Diagnosis not present

## 2022-07-11 DIAGNOSIS — Z992 Dependence on renal dialysis: Secondary | ICD-10-CM | POA: Diagnosis not present

## 2022-07-11 DIAGNOSIS — D631 Anemia in chronic kidney disease: Secondary | ICD-10-CM | POA: Diagnosis not present

## 2022-07-11 DIAGNOSIS — N2581 Secondary hyperparathyroidism of renal origin: Secondary | ICD-10-CM | POA: Diagnosis not present

## 2022-07-11 DIAGNOSIS — N250 Renal osteodystrophy: Secondary | ICD-10-CM | POA: Diagnosis not present

## 2022-07-11 DIAGNOSIS — N186 End stage renal disease: Secondary | ICD-10-CM | POA: Diagnosis not present

## 2022-07-11 DIAGNOSIS — D509 Iron deficiency anemia, unspecified: Secondary | ICD-10-CM | POA: Diagnosis not present

## 2022-07-13 DIAGNOSIS — N2581 Secondary hyperparathyroidism of renal origin: Secondary | ICD-10-CM | POA: Diagnosis not present

## 2022-07-13 DIAGNOSIS — D509 Iron deficiency anemia, unspecified: Secondary | ICD-10-CM | POA: Diagnosis not present

## 2022-07-13 DIAGNOSIS — N250 Renal osteodystrophy: Secondary | ICD-10-CM | POA: Diagnosis not present

## 2022-07-13 DIAGNOSIS — Z992 Dependence on renal dialysis: Secondary | ICD-10-CM | POA: Diagnosis not present

## 2022-07-13 DIAGNOSIS — D631 Anemia in chronic kidney disease: Secondary | ICD-10-CM | POA: Diagnosis not present

## 2022-07-13 DIAGNOSIS — N186 End stage renal disease: Secondary | ICD-10-CM | POA: Diagnosis not present

## 2022-07-16 DIAGNOSIS — N186 End stage renal disease: Secondary | ICD-10-CM | POA: Diagnosis not present

## 2022-07-16 DIAGNOSIS — D631 Anemia in chronic kidney disease: Secondary | ICD-10-CM | POA: Diagnosis not present

## 2022-07-16 DIAGNOSIS — N2581 Secondary hyperparathyroidism of renal origin: Secondary | ICD-10-CM | POA: Diagnosis not present

## 2022-07-16 DIAGNOSIS — D509 Iron deficiency anemia, unspecified: Secondary | ICD-10-CM | POA: Diagnosis not present

## 2022-07-16 DIAGNOSIS — Z992 Dependence on renal dialysis: Secondary | ICD-10-CM | POA: Diagnosis not present

## 2022-07-16 DIAGNOSIS — N250 Renal osteodystrophy: Secondary | ICD-10-CM | POA: Diagnosis not present

## 2022-07-17 ENCOUNTER — Ambulatory Visit (INDEPENDENT_AMBULATORY_CARE_PROVIDER_SITE_OTHER): Payer: Medicare Other | Admitting: Internal Medicine

## 2022-07-17 ENCOUNTER — Encounter: Payer: Self-pay | Admitting: Internal Medicine

## 2022-07-17 VITALS — BP 195/84 | HR 97 | Temp 97.5°F | Ht 63.0 in | Wt 248.6 lb

## 2022-07-17 DIAGNOSIS — N186 End stage renal disease: Secondary | ICD-10-CM | POA: Diagnosis not present

## 2022-07-17 DIAGNOSIS — K5900 Constipation, unspecified: Secondary | ICD-10-CM | POA: Diagnosis not present

## 2022-07-17 DIAGNOSIS — N2581 Secondary hyperparathyroidism of renal origin: Secondary | ICD-10-CM | POA: Diagnosis not present

## 2022-07-17 DIAGNOSIS — K219 Gastro-esophageal reflux disease without esophagitis: Secondary | ICD-10-CM | POA: Diagnosis not present

## 2022-07-17 DIAGNOSIS — D509 Iron deficiency anemia, unspecified: Secondary | ICD-10-CM | POA: Diagnosis not present

## 2022-07-17 DIAGNOSIS — Z992 Dependence on renal dialysis: Secondary | ICD-10-CM | POA: Diagnosis not present

## 2022-07-17 DIAGNOSIS — N250 Renal osteodystrophy: Secondary | ICD-10-CM | POA: Diagnosis not present

## 2022-07-17 DIAGNOSIS — I2510 Atherosclerotic heart disease of native coronary artery without angina pectoris: Secondary | ICD-10-CM

## 2022-07-17 DIAGNOSIS — D631 Anemia in chronic kidney disease: Secondary | ICD-10-CM | POA: Diagnosis not present

## 2022-07-17 NOTE — Patient Instructions (Signed)
It was good seeing you again today  You may continue taking Linzess 290 once daily along with a stool softener.  You may also continue taking dicyclomine as needed for abdominal cramps during dialysis  Continue pantoprazole 40 mg daily-30 minutes before breakfast for GERD  Plan for a screening colonoscopy 2027  Office visit with [place] in 1 year and as needed.

## 2022-07-17 NOTE — Progress Notes (Signed)
Primary Care Physician:  Scherrie Bateman Primary Gastroenterologist:  Dr. Gala Romney  Pre-Procedure History & Physical: HPI:  Patricia [name] is a 51 y.o. female with multiple comorbidities including diabetes, end-stage renal disease on dialysis returns for follow-up of chronic constipation and GERD.   GERD well-controlled on pantoprazole 40 mg daily.  No dysphagia.  Some challenges with managing constipation recently.  However, currently she is taking Linzess 290 along with 1 stool softener at the same time every day and this is producing 3 formed bowel movements daily.  She is quite satisfied with her improvement with this regimen.  She is not passing any blood.  Normal colonoscopy 2017; due for average rescreening 2027.  She limits dicyclomine use to abdominal cramps which occur reproducibly during hemodialysis.  Past Medical History:  Diagnosis Date   Anemia    Ankle fracture    Arthritis    Blood transfusion without reported diagnosis    Breast cancer ([place])    Cancer ([place])    Diabetes mellitus without complication ([place])    Dialysis patient ([place])    mon, wed, friday,    End stage renal disease on dialysis ([place])    M/W/F [facility] in [place]   GERD (gastroesophageal reflux disease)    Hypertension    OSA (obstructive sleep apnea)    uses CPAP sometimes   Pneumonia    PONV (postoperative nausea and vomiting)    Wears glasses     Past Surgical History:  Procedure Laterality Date   ABDOMINAL HYSTERECTOMY     AV FISTULA PLACEMENT  11/2014   at [place] N/A 04/2021   BALLOON DILATION N/A 07/10/2016   Procedure: BALLOON DILATION;  Surgeon: Danie Binder, MD;  [facility];  Service: Endoscopy;  Laterality: N/A;  Pyloric dilation   BREAST LUMPECTOMY     CESAREAN SECTION     CHOLECYSTECTOMY     COLONOSCOPY WITH PROPOFOL N/A 09/27/2016   Dr. Gala Romney: Internal hemorrhoids repeat colonoscopy in 10 years   DILATION AND CURETTAGE OF UTERUS      ESOPHAGOGASTRODUODENOSCOPY N/A 07/10/2016   Dr.Fields- normal esophagus, gastric stenosis was found at the pylorus, gastritis on bx, normal examined duodenun   EXTERNAL FIXATION REMOVAL Right 10/29/2018   Procedure: REMOVAL RIGHT ANKLE BIOMET ZIMMER EXTERNAL FIXATOR, SHORT LEG CAST APPLICATION;  Surgeon: Marybelle Killings, MD;  Location: [facility];  Service: Orthopedics;  Laterality: Right;   MASTECTOMY     left sided   ORIF ANKLE FRACTURE Right 10/06/2018   Procedure: OPEN REDUCTION INTERNAL FIXATION (ORIF) RIGHT ANKLE TRIMALLEOLAR;  Surgeon: Marybelle Killings, MD;  Location: [facility];  Service: Orthopedics;  Laterality: Right;    Prior to Admission medications   Medication Sig Start Date End Date Taking? Authorizing Provider  acetaminophen (TYLENOL) 325 MG tablet Take 650 mg by mouth every 6 (six) hours as needed.   Yes [provider]  albuterol (PROVENTIL) (2.5 MG/3ML) 0.083% nebulizer solution Take 3 mLs (2.5 mg total) by nebulization every 6 (six) hours as needed for wheezing or shortness of breath. 11/28/17  Yes Kathie Dike, MD  albuterol (VENTOLIN HFA) 108 (90 Base) MCG/ACT inhaler Inhale 2 puffs into the lungs every 6 (six) hours as needed for wheezing or shortness of breath. 06/28/22  Yes Chesley Mires, MD  aspirin EC 81 MG tablet Take 1 tablet (81 mg total) by mouth daily. Swallow whole. 12/07/21  Yes Fay Records, MD  cetirizine (ZYRTEC) 10 MG  tablet Take 10 mg by mouth daily. 03/24/22  Yes [provider]  Cholecalciferol (VITAMIN D3) 125 MCG (5000 UT) CAPS Take 1 capsule (5,000 Units total) by mouth daily. 05/24/22  Yes Nida, Marella Chimes, MD  cinacalcet (SENSIPAR) 60 MG tablet Take 30 mg by mouth daily with breakfast.   Yes [provider]  dicyclomine (BENTYL) 10 MG capsule Take 1 capsule (10 mg total) by mouth 2 (two) times daily as needed. 03/20/22  Yes Annitta Needs, NP  ferric citrate (AURYXIA) 1 GM 210 MG(Fe) tablet Take 2 tablets by mouth 2 (two) times daily  with a meal. With Breakfast & with supper   Yes [provider]  glipiZIDE (GLUCOTROL) 10 MG tablet Take 10 mg by mouth 2 (two) times daily. 03/27/22  Yes [provider]  ipratropium (ATROVENT) 0.03 % nasal spray Place 2 sprays into both nostrils 2 (two) times daily.  06/06/19  Yes [provider]  labetalol (NORMODYNE) 100 MG tablet Take 100 mg by mouth 2 (two) times daily.   Yes [provider]  linaclotide (LINZESS) 290 MCG CAPS capsule Take 290 mcg by mouth daily before breakfast.   Yes [provider]  montelukast (SINGULAIR) 10 MG tablet Take 1 tablet (10 mg total) by mouth at bedtime. 07/12/21  Yes Freddi Starr, MD  multivitamin (RENA-VIT) TABS tablet Take 1 tablet by mouth daily.   Yes [provider]  ondansetron (ZOFRAN-ODT) 4 MG disintegrating tablet Take 4 mg by mouth as needed for nausea or vomiting.    Yes [provider]  pantoprazole (PROTONIX) 40 MG tablet Take 1 tablet (40 mg total) by mouth daily. 30 minutes before breakfast 03/20/22  Yes Annitta Needs, NP  rOPINIRole (REQUIP XL) 2 MG 24 hr tablet Take 2 mg by mouth at bedtime.  09/10/16  Yes [provider]  rosuvastatin (CRESTOR) 10 MG tablet Take 1 tablet (10 mg total) by mouth daily. 01/08/22  Yes Fay Records, MD  sevelamer carbonate (RENVELA) 800 MG tablet Take 800 mg by mouth See admin instructions. Take 2 tablets (1600 mg) in the morning, take 3 tablets (2400 mg) with lunch or snack,  and take 2 tablets (1600 mg) by mouth in the evening with dinner meal.   Yes [provider]  sodium chloride (OCEAN) 0.65 % SOLN nasal spray Place 1 spray into both nostrils as needed for congestion. 01/02/20  Yes Hongalgi, Lenis Dickinson, MD  SYMBICORT 160-4.5 MCG/ACT inhaler Inhale into the lungs. 04/20/22  Yes [provider]  VITAMIN D PO Take by mouth.   Yes [provider]  NIFEdipine (ADALAT CC) 60 MG 24 hr tablet Take 60 mg by mouth  daily. Patient not taking: Reported on 07/17/2022    [provider]    Allergies as of 07/17/2022 - Review Complete 07/17/2022  Allergen Reaction Noted   Amlodipine besylate Rash and Other (See Comments) 12/04/2015   Reglan [metoclopramide] Other (See Comments) 12/04/2015    Family History  Problem Relation Age of Onset   Diabetes Mellitus II Mother    Hypertension Mother    Heart block Mother    Hypertension Sister    Hypertension Sister    Colon cancer Neg Hx     Social History   Socioeconomic History   Marital status: Widowed    Spouse name: Not on file   Number of children: Not on file   Years of education: Not on file   Highest education level: Not on  file  Occupational History   Not on file  Tobacco Use   Smoking status: Never   Smokeless tobacco: Never  Vaping Use   Vaping Use: Never used  Substance and Sexual Activity   Alcohol use: No    Alcohol/week: 0.0 standard drinks of alcohol   Drug use: No   Sexual activity: Not Currently  Other Topics Concern   Not on file  Social History Narrative   Not on file   Social Determinants of Health   Financial Resource Strain: Not on file  Food Insecurity: Not on file  Transportation Needs: Not on file  Physical Activity: Not on file  Stress: Not on file  Social Connections: Not on file  Intimate Partner Violence: Not on file    Review of Systems: See HPI, otherwise negative ROS  Physical Exam: BP (!) 195/84 (BP Location: Right Arm, Patient Position: Sitting, Cuff Size: Large)   Pulse 97   Temp (!) 97.5 F (36.4 C) (Temporal)   Ht 5\' 3"  (1.6 m)   Wt 248 lb 9.6 oz (112.8 kg)   SpO2 91%   BMI 44.04 kg/m  General:   Alert, obese, chronically ill appearing lady's; pleasant and cooperative in NAD Lungs:  Clear throughout to auscultation.   No wheezes, crackles, or rhonchi. No acute distress. Heart:  Regular rate and rhythm; no murmurs, clicks, rubs,  or gallops. Abdomen: Obese.  Positive bowel  sounds, soft and nontender without appreciable mass organomegaly  Pulses:  Normal pulses noted. Extremities:  Without clubbing or edema.  Impression/Plan: 51 year old lady with chronic constipation.  Seems to be well managed with Linzess 290 and a stool softener taken once daily.  She is very pleased with her progress on this regimen.  Abdominal cramps managed with dicyclomine during dialysis.  GERD well-controlled on pantoprazole 40 mg daily.  Recommendations: You may continue taking Linzess 290 once daily along with a stool softener.  You may also continue taking dicyclomine as needed for abdominal cramps during dialysis  Continue pantoprazole 40 mg daily-30 minutes before breakfast for GERD  Plan for a screening colonoscopy 2027  Office visit with [place] in 1 year and as needed.  Notice: This dictation was prepared with Dragon dictation along with smaller phrase technology. Any transcriptional errors that result from this process are unintentional and may not be corrected upon review.

## 2022-07-18 DIAGNOSIS — N250 Renal osteodystrophy: Secondary | ICD-10-CM | POA: Diagnosis not present

## 2022-07-18 DIAGNOSIS — D509 Iron deficiency anemia, unspecified: Secondary | ICD-10-CM | POA: Diagnosis not present

## 2022-07-18 DIAGNOSIS — N186 End stage renal disease: Secondary | ICD-10-CM | POA: Diagnosis not present

## 2022-07-18 DIAGNOSIS — D631 Anemia in chronic kidney disease: Secondary | ICD-10-CM | POA: Diagnosis not present

## 2022-07-18 DIAGNOSIS — Z992 Dependence on renal dialysis: Secondary | ICD-10-CM | POA: Diagnosis not present

## 2022-07-18 DIAGNOSIS — N2581 Secondary hyperparathyroidism of renal origin: Secondary | ICD-10-CM | POA: Diagnosis not present

## 2022-07-20 DIAGNOSIS — D631 Anemia in chronic kidney disease: Secondary | ICD-10-CM | POA: Diagnosis not present

## 2022-07-20 DIAGNOSIS — N2581 Secondary hyperparathyroidism of renal origin: Secondary | ICD-10-CM | POA: Diagnosis not present

## 2022-07-20 DIAGNOSIS — N250 Renal osteodystrophy: Secondary | ICD-10-CM | POA: Diagnosis not present

## 2022-07-20 DIAGNOSIS — Z992 Dependence on renal dialysis: Secondary | ICD-10-CM | POA: Diagnosis not present

## 2022-07-20 DIAGNOSIS — N186 End stage renal disease: Secondary | ICD-10-CM | POA: Diagnosis not present

## 2022-07-20 DIAGNOSIS — D509 Iron deficiency anemia, unspecified: Secondary | ICD-10-CM | POA: Diagnosis not present

## 2022-07-21 DIAGNOSIS — N186 End stage renal disease: Secondary | ICD-10-CM | POA: Diagnosis not present

## 2022-07-21 DIAGNOSIS — D631 Anemia in chronic kidney disease: Secondary | ICD-10-CM | POA: Diagnosis not present

## 2022-07-21 DIAGNOSIS — N250 Renal osteodystrophy: Secondary | ICD-10-CM | POA: Diagnosis not present

## 2022-07-21 DIAGNOSIS — D509 Iron deficiency anemia, unspecified: Secondary | ICD-10-CM | POA: Diagnosis not present

## 2022-07-21 DIAGNOSIS — Z992 Dependence on renal dialysis: Secondary | ICD-10-CM | POA: Diagnosis not present

## 2022-07-21 DIAGNOSIS — N2581 Secondary hyperparathyroidism of renal origin: Secondary | ICD-10-CM | POA: Diagnosis not present

## 2022-07-23 DIAGNOSIS — D509 Iron deficiency anemia, unspecified: Secondary | ICD-10-CM | POA: Diagnosis not present

## 2022-07-23 DIAGNOSIS — N250 Renal osteodystrophy: Secondary | ICD-10-CM | POA: Diagnosis not present

## 2022-07-23 DIAGNOSIS — N186 End stage renal disease: Secondary | ICD-10-CM | POA: Diagnosis not present

## 2022-07-23 DIAGNOSIS — D631 Anemia in chronic kidney disease: Secondary | ICD-10-CM | POA: Diagnosis not present

## 2022-07-23 DIAGNOSIS — Z992 Dependence on renal dialysis: Secondary | ICD-10-CM | POA: Diagnosis not present

## 2022-07-23 DIAGNOSIS — N2581 Secondary hyperparathyroidism of renal origin: Secondary | ICD-10-CM | POA: Diagnosis not present

## 2022-07-25 DIAGNOSIS — Z992 Dependence on renal dialysis: Secondary | ICD-10-CM | POA: Diagnosis not present

## 2022-07-25 DIAGNOSIS — N2581 Secondary hyperparathyroidism of renal origin: Secondary | ICD-10-CM | POA: Diagnosis not present

## 2022-07-25 DIAGNOSIS — D631 Anemia in chronic kidney disease: Secondary | ICD-10-CM | POA: Diagnosis not present

## 2022-07-25 DIAGNOSIS — D509 Iron deficiency anemia, unspecified: Secondary | ICD-10-CM | POA: Diagnosis not present

## 2022-07-25 DIAGNOSIS — N250 Renal osteodystrophy: Secondary | ICD-10-CM | POA: Diagnosis not present

## 2022-07-25 DIAGNOSIS — N186 End stage renal disease: Secondary | ICD-10-CM | POA: Diagnosis not present

## 2022-07-27 DIAGNOSIS — N2581 Secondary hyperparathyroidism of renal origin: Secondary | ICD-10-CM | POA: Diagnosis not present

## 2022-07-27 DIAGNOSIS — N186 End stage renal disease: Secondary | ICD-10-CM | POA: Diagnosis not present

## 2022-07-27 DIAGNOSIS — N250 Renal osteodystrophy: Secondary | ICD-10-CM | POA: Diagnosis not present

## 2022-07-27 DIAGNOSIS — D509 Iron deficiency anemia, unspecified: Secondary | ICD-10-CM | POA: Diagnosis not present

## 2022-07-27 DIAGNOSIS — Z992 Dependence on renal dialysis: Secondary | ICD-10-CM | POA: Diagnosis not present

## 2022-07-27 DIAGNOSIS — D631 Anemia in chronic kidney disease: Secondary | ICD-10-CM | POA: Diagnosis not present

## 2022-07-30 DIAGNOSIS — Z992 Dependence on renal dialysis: Secondary | ICD-10-CM | POA: Diagnosis not present

## 2022-07-30 DIAGNOSIS — D631 Anemia in chronic kidney disease: Secondary | ICD-10-CM | POA: Diagnosis not present

## 2022-07-30 DIAGNOSIS — N250 Renal osteodystrophy: Secondary | ICD-10-CM | POA: Diagnosis not present

## 2022-07-30 DIAGNOSIS — N186 End stage renal disease: Secondary | ICD-10-CM | POA: Diagnosis not present

## 2022-07-30 DIAGNOSIS — D509 Iron deficiency anemia, unspecified: Secondary | ICD-10-CM | POA: Diagnosis not present

## 2022-07-30 DIAGNOSIS — N2581 Secondary hyperparathyroidism of renal origin: Secondary | ICD-10-CM | POA: Diagnosis not present

## 2022-08-01 DIAGNOSIS — N186 End stage renal disease: Secondary | ICD-10-CM | POA: Diagnosis not present

## 2022-08-01 DIAGNOSIS — D631 Anemia in chronic kidney disease: Secondary | ICD-10-CM | POA: Diagnosis not present

## 2022-08-01 DIAGNOSIS — Z992 Dependence on renal dialysis: Secondary | ICD-10-CM | POA: Diagnosis not present

## 2022-08-01 DIAGNOSIS — N250 Renal osteodystrophy: Secondary | ICD-10-CM | POA: Diagnosis not present

## 2022-08-01 DIAGNOSIS — D509 Iron deficiency anemia, unspecified: Secondary | ICD-10-CM | POA: Diagnosis not present

## 2022-08-01 DIAGNOSIS — N2581 Secondary hyperparathyroidism of renal origin: Secondary | ICD-10-CM | POA: Diagnosis not present

## 2022-08-03 DIAGNOSIS — N186 End stage renal disease: Secondary | ICD-10-CM | POA: Diagnosis not present

## 2022-08-03 DIAGNOSIS — N250 Renal osteodystrophy: Secondary | ICD-10-CM | POA: Diagnosis not present

## 2022-08-03 DIAGNOSIS — Z992 Dependence on renal dialysis: Secondary | ICD-10-CM | POA: Diagnosis not present

## 2022-08-03 DIAGNOSIS — D509 Iron deficiency anemia, unspecified: Secondary | ICD-10-CM | POA: Diagnosis not present

## 2022-08-03 DIAGNOSIS — D631 Anemia in chronic kidney disease: Secondary | ICD-10-CM | POA: Diagnosis not present

## 2022-08-03 DIAGNOSIS — N2581 Secondary hyperparathyroidism of renal origin: Secondary | ICD-10-CM | POA: Diagnosis not present

## 2022-08-06 DIAGNOSIS — N186 End stage renal disease: Secondary | ICD-10-CM | POA: Diagnosis not present

## 2022-08-06 DIAGNOSIS — D509 Iron deficiency anemia, unspecified: Secondary | ICD-10-CM | POA: Diagnosis not present

## 2022-08-06 DIAGNOSIS — N250 Renal osteodystrophy: Secondary | ICD-10-CM | POA: Diagnosis not present

## 2022-08-06 DIAGNOSIS — N2581 Secondary hyperparathyroidism of renal origin: Secondary | ICD-10-CM | POA: Diagnosis not present

## 2022-08-06 DIAGNOSIS — Z992 Dependence on renal dialysis: Secondary | ICD-10-CM | POA: Diagnosis not present

## 2022-08-06 DIAGNOSIS — D631 Anemia in chronic kidney disease: Secondary | ICD-10-CM | POA: Diagnosis not present

## 2022-08-07 DIAGNOSIS — H6982 Other specified disorders of Eustachian tube, left ear: Secondary | ICD-10-CM | POA: Diagnosis not present

## 2022-08-07 DIAGNOSIS — H903 Sensorineural hearing loss, bilateral: Secondary | ICD-10-CM | POA: Diagnosis not present

## 2022-08-08 DIAGNOSIS — N186 End stage renal disease: Secondary | ICD-10-CM | POA: Diagnosis not present

## 2022-08-08 DIAGNOSIS — N250 Renal osteodystrophy: Secondary | ICD-10-CM | POA: Diagnosis not present

## 2022-08-08 DIAGNOSIS — N2581 Secondary hyperparathyroidism of renal origin: Secondary | ICD-10-CM | POA: Diagnosis not present

## 2022-08-08 DIAGNOSIS — D509 Iron deficiency anemia, unspecified: Secondary | ICD-10-CM | POA: Diagnosis not present

## 2022-08-08 DIAGNOSIS — Z992 Dependence on renal dialysis: Secondary | ICD-10-CM | POA: Diagnosis not present

## 2022-08-08 DIAGNOSIS — D631 Anemia in chronic kidney disease: Secondary | ICD-10-CM | POA: Diagnosis not present

## 2022-08-10 DIAGNOSIS — N250 Renal osteodystrophy: Secondary | ICD-10-CM | POA: Diagnosis not present

## 2022-08-10 DIAGNOSIS — Z992 Dependence on renal dialysis: Secondary | ICD-10-CM | POA: Diagnosis not present

## 2022-08-10 DIAGNOSIS — N2581 Secondary hyperparathyroidism of renal origin: Secondary | ICD-10-CM | POA: Diagnosis not present

## 2022-08-10 DIAGNOSIS — N186 End stage renal disease: Secondary | ICD-10-CM | POA: Diagnosis not present

## 2022-08-10 DIAGNOSIS — D509 Iron deficiency anemia, unspecified: Secondary | ICD-10-CM | POA: Diagnosis not present

## 2022-08-10 DIAGNOSIS — D631 Anemia in chronic kidney disease: Secondary | ICD-10-CM | POA: Diagnosis not present

## 2022-08-13 DIAGNOSIS — Z992 Dependence on renal dialysis: Secondary | ICD-10-CM | POA: Diagnosis not present

## 2022-08-13 DIAGNOSIS — N186 End stage renal disease: Secondary | ICD-10-CM | POA: Diagnosis not present

## 2022-08-13 DIAGNOSIS — N2581 Secondary hyperparathyroidism of renal origin: Secondary | ICD-10-CM | POA: Diagnosis not present

## 2022-08-15 DIAGNOSIS — N186 End stage renal disease: Secondary | ICD-10-CM | POA: Diagnosis not present

## 2022-08-15 DIAGNOSIS — N2581 Secondary hyperparathyroidism of renal origin: Secondary | ICD-10-CM | POA: Diagnosis not present

## 2022-08-15 DIAGNOSIS — Z992 Dependence on renal dialysis: Secondary | ICD-10-CM | POA: Diagnosis not present

## 2022-08-16 DIAGNOSIS — N186 End stage renal disease: Secondary | ICD-10-CM | POA: Diagnosis not present

## 2022-08-16 DIAGNOSIS — Z992 Dependence on renal dialysis: Secondary | ICD-10-CM | POA: Diagnosis not present

## 2022-08-17 DIAGNOSIS — D509 Iron deficiency anemia, unspecified: Secondary | ICD-10-CM | POA: Diagnosis not present

## 2022-08-17 DIAGNOSIS — D631 Anemia in chronic kidney disease: Secondary | ICD-10-CM | POA: Diagnosis not present

## 2022-08-17 DIAGNOSIS — Z992 Dependence on renal dialysis: Secondary | ICD-10-CM | POA: Diagnosis not present

## 2022-08-17 DIAGNOSIS — N250 Renal osteodystrophy: Secondary | ICD-10-CM | POA: Diagnosis not present

## 2022-08-17 DIAGNOSIS — N186 End stage renal disease: Secondary | ICD-10-CM | POA: Diagnosis not present

## 2022-08-17 DIAGNOSIS — N2581 Secondary hyperparathyroidism of renal origin: Secondary | ICD-10-CM | POA: Diagnosis not present

## 2022-08-20 DIAGNOSIS — N2581 Secondary hyperparathyroidism of renal origin: Secondary | ICD-10-CM | POA: Diagnosis not present

## 2022-08-20 DIAGNOSIS — Z992 Dependence on renal dialysis: Secondary | ICD-10-CM | POA: Diagnosis not present

## 2022-08-20 DIAGNOSIS — N250 Renal osteodystrophy: Secondary | ICD-10-CM | POA: Diagnosis not present

## 2022-08-20 DIAGNOSIS — D509 Iron deficiency anemia, unspecified: Secondary | ICD-10-CM | POA: Diagnosis not present

## 2022-08-20 DIAGNOSIS — N186 End stage renal disease: Secondary | ICD-10-CM | POA: Diagnosis not present

## 2022-08-20 DIAGNOSIS — D631 Anemia in chronic kidney disease: Secondary | ICD-10-CM | POA: Diagnosis not present

## 2022-08-22 DIAGNOSIS — N2581 Secondary hyperparathyroidism of renal origin: Secondary | ICD-10-CM | POA: Diagnosis not present

## 2022-08-22 DIAGNOSIS — D509 Iron deficiency anemia, unspecified: Secondary | ICD-10-CM | POA: Diagnosis not present

## 2022-08-22 DIAGNOSIS — D631 Anemia in chronic kidney disease: Secondary | ICD-10-CM | POA: Diagnosis not present

## 2022-08-22 DIAGNOSIS — N186 End stage renal disease: Secondary | ICD-10-CM | POA: Diagnosis not present

## 2022-08-22 DIAGNOSIS — Z992 Dependence on renal dialysis: Secondary | ICD-10-CM | POA: Diagnosis not present

## 2022-08-22 DIAGNOSIS — N250 Renal osteodystrophy: Secondary | ICD-10-CM | POA: Diagnosis not present

## 2022-08-23 ENCOUNTER — Ambulatory Visit: Payer: Medicare Other | Admitting: Orthopaedic Surgery

## 2022-08-23 DIAGNOSIS — H6522 Chronic serous otitis media, left ear: Secondary | ICD-10-CM | POA: Diagnosis not present

## 2022-08-24 DIAGNOSIS — D509 Iron deficiency anemia, unspecified: Secondary | ICD-10-CM | POA: Diagnosis not present

## 2022-08-24 DIAGNOSIS — D631 Anemia in chronic kidney disease: Secondary | ICD-10-CM | POA: Diagnosis not present

## 2022-08-24 DIAGNOSIS — Z992 Dependence on renal dialysis: Secondary | ICD-10-CM | POA: Diagnosis not present

## 2022-08-24 DIAGNOSIS — N250 Renal osteodystrophy: Secondary | ICD-10-CM | POA: Diagnosis not present

## 2022-08-24 DIAGNOSIS — N186 End stage renal disease: Secondary | ICD-10-CM | POA: Diagnosis not present

## 2022-08-24 DIAGNOSIS — N2581 Secondary hyperparathyroidism of renal origin: Secondary | ICD-10-CM | POA: Diagnosis not present

## 2022-08-27 DIAGNOSIS — D509 Iron deficiency anemia, unspecified: Secondary | ICD-10-CM | POA: Diagnosis not present

## 2022-08-27 DIAGNOSIS — Z992 Dependence on renal dialysis: Secondary | ICD-10-CM | POA: Diagnosis not present

## 2022-08-27 DIAGNOSIS — N186 End stage renal disease: Secondary | ICD-10-CM | POA: Diagnosis not present

## 2022-08-27 DIAGNOSIS — N250 Renal osteodystrophy: Secondary | ICD-10-CM | POA: Diagnosis not present

## 2022-08-27 DIAGNOSIS — N2581 Secondary hyperparathyroidism of renal origin: Secondary | ICD-10-CM | POA: Diagnosis not present

## 2022-08-27 DIAGNOSIS — D631 Anemia in chronic kidney disease: Secondary | ICD-10-CM | POA: Diagnosis not present

## 2022-08-28 ENCOUNTER — Telehealth: Payer: Self-pay | Admitting: *Deleted

## 2022-08-28 NOTE — Chronic Care Management (AMB) (Signed)
  Care Coordination  Outreach Note  08/28/2022 Name: Patricia [name] MRN: [id number] DOB: August 15, 1971   Care Coordination Outreach Attempts: An unsuccessful telephone outreach was attempted today to offer the patient information about available care coordination services as a benefit of their health plan.   Follow Up Plan:  Additional outreach attempts will be made to offer the patient care coordination information and services.   Encounter Outcome:  No Answer  [place]  Direct Dial: [phone]

## 2022-08-29 DIAGNOSIS — N2581 Secondary hyperparathyroidism of renal origin: Secondary | ICD-10-CM | POA: Diagnosis not present

## 2022-08-29 DIAGNOSIS — D631 Anemia in chronic kidney disease: Secondary | ICD-10-CM | POA: Diagnosis not present

## 2022-08-29 DIAGNOSIS — D509 Iron deficiency anemia, unspecified: Secondary | ICD-10-CM | POA: Diagnosis not present

## 2022-08-29 DIAGNOSIS — N186 End stage renal disease: Secondary | ICD-10-CM | POA: Diagnosis not present

## 2022-08-29 DIAGNOSIS — N250 Renal osteodystrophy: Secondary | ICD-10-CM | POA: Diagnosis not present

## 2022-08-29 DIAGNOSIS — Z992 Dependence on renal dialysis: Secondary | ICD-10-CM | POA: Diagnosis not present

## 2022-08-30 ENCOUNTER — Encounter: Payer: Self-pay | Admitting: Orthopaedic Surgery

## 2022-08-30 ENCOUNTER — Ambulatory Visit (INDEPENDENT_AMBULATORY_CARE_PROVIDER_SITE_OTHER): Payer: Medicare Other | Admitting: Orthopaedic Surgery

## 2022-08-30 VITALS — Ht 63.0 in | Wt 247.0 lb

## 2022-08-30 DIAGNOSIS — I2510 Atherosclerotic heart disease of native coronary artery without angina pectoris: Secondary | ICD-10-CM | POA: Diagnosis not present

## 2022-08-30 DIAGNOSIS — M19171 Post-traumatic osteoarthritis, right ankle and foot: Secondary | ICD-10-CM | POA: Diagnosis not present

## 2022-08-30 DIAGNOSIS — Z6841 Body Mass Index (BMI) 40.0 and over, adult: Secondary | ICD-10-CM | POA: Diagnosis not present

## 2022-08-30 NOTE — Progress Notes (Unsigned)
Office Visit Note   Patient: Patricia [name]           Date of Birth: 11/29/1971           MRN: [id number] Visit Date: 08/30/2022              Requested by: Jake Samples, PA-C [address] [place],  [place] [address] PCP: Jake Samples, PA-C   Assessment & Plan: Visit Diagnoses: No diagnosis found.  Plan: ***  Follow-Up Instructions: No follow-ups on file.   Orders:  No orders of the defined types were placed in this encounter.  No orders of the defined types were placed in this encounter.     Procedures: No procedures performed   Clinical Data: No additional findings.   Subjective: Chief Complaint  Patient presents with   Right Ankle - Pain, Follow-up    HPI  Review of Systems   Objective: Vital Signs: Ht 5\' 3"  (1.6 m)   Wt 247 lb (112 kg)   BMI 43.75 kg/m   Physical Exam  Ortho Exam  Specialty Comments:  No specialty comments available.  Imaging: No results found.   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   Elevated alkaline phosphatase level 05/24/2022   Right foot sprain 12/20/2020   Contusion of left shoulder 12/20/2020   Syncope 08/03/2020   Acute pulmonary edema ([place]) 01/11/2020   OSA (obstructive sleep apnea)    Closed right ankle fracture 10/06/2018   Closed displaced trimalleolar fracture of right ankle 10/06/2018   Acute colitis 02/26/2018   Hypoxemia 02/24/2018   Dyspnea 11/26/2017   Anemia 11/26/2017   Elevated troponin 11/26/2017   Hyperkalemia 09/29/2017   Acute respiratory failure ([place]) 09/28/2017   CAP (community acquired pneumonia) 09/26/2017   Volume overload 09/25/2017   Acute on chronic diastolic CHF (congestive heart failure) ([place]) 09/25/2017   Lactic acidosis 09/25/2017   Hypokalemia 09/25/2017   Breast cancer ([place]) 07/22/2017   SOB (shortness of breath)    Acute respiratory failure with hypoxia ([place]) 07/21/2017   Flatulence 10/25/2016    GERD (gastroesophageal reflux disease) 08/02/2016   Pyloric stenosis, acquired    Nausea with vomiting    Generalized abdominal pain    Chest pain, rule out acute myocardial infarction 07/04/2016   Constipation 07/04/2016   Nausea 07/04/2016   Essential hypertension 07/04/2016   HCAP (healthcare-associated pneumonia) 12/04/2015   ESRD on dialysis ([place]) 12/04/2015   Type 2 diabetes mellitus ([place]) 12/04/2015   Past Medical History:  Diagnosis Date   Anemia    Ankle fracture    Arthritis    Blood transfusion without reported diagnosis    Breast cancer ([place])    Cancer ([place])    Diabetes mellitus without complication ([place])    Dialysis patient ([place])    mon, wed, friday,    End stage renal disease on dialysis ([place])    M/W/F [facility] in [place]   GERD (gastroesophageal reflux disease)    Hypertension    OSA (obstructive sleep apnea)    uses CPAP sometimes   Pneumonia    PONV (postoperative nausea and vomiting)    Wears glasses     Family History  Problem Relation Age of Onset   Diabetes Mellitus II Mother    Hypertension Mother    Heart block Mother    Hypertension Sister    Hypertension Sister    Colon cancer Neg Hx     Past Surgical History:  Procedure Laterality Date   ABDOMINAL HYSTERECTOMY     AV FISTULA PLACEMENT  11/2014   at [place] N/A 04/2021   BALLOON DILATION N/A 07/10/2016   Procedure: BALLOON DILATION;  Surgeon: Danie Binder, MD;  [facility];  Service: Endoscopy;  Laterality: N/A;  Pyloric dilation   BREAST LUMPECTOMY     CESAREAN SECTION     CHOLECYSTECTOMY     COLONOSCOPY WITH PROPOFOL N/A 09/27/2016   Dr. Gala Romney: Internal hemorrhoids repeat colonoscopy in 10 years   DILATION AND CURETTAGE OF UTERUS     ESOPHAGOGASTRODUODENOSCOPY N/A 07/10/2016   Dr.Fields- normal esophagus, gastric stenosis was found at the pylorus, gastritis on bx, normal examined duodenun   EXTERNAL FIXATION REMOVAL Right 10/29/2018    Procedure: REMOVAL RIGHT ANKLE BIOMET ZIMMER EXTERNAL FIXATOR, SHORT LEG CAST APPLICATION;  Surgeon: Marybelle Killings, MD;  Location: [facility];  Service: Orthopedics;  Laterality: Right;   MASTECTOMY     left sided   ORIF ANKLE FRACTURE Right 10/06/2018   Procedure: OPEN REDUCTION INTERNAL FIXATION (ORIF) RIGHT ANKLE TRIMALLEOLAR;  Surgeon: Marybelle Killings, MD;  Location: [facility];  Service: Orthopedics;  Laterality: Right;   Social History   Occupational History   Not on file  Tobacco Use   Smoking status: Never   Smokeless tobacco: Never  Vaping Use   Vaping Use: Never used  Substance and Sexual Activity   Alcohol use: No    Alcohol/week: 0.0 standard drinks of alcohol   Drug use: No   Sexual activity: Not Currently

## 2022-08-30 NOTE — Chronic Care Management (AMB) (Signed)
  Care Coordination   Note   08/30/2022 Name: Patricia [name] MRN: [id number] DOB: 1971/05/08  Patricia [name] is a 51 y.o. year old female who sees Jake Samples, [place] for primary care. I reached out to [employer] by phone today to offer care coordination services.  [name] was given information about Care Coordination services today including:   The Care Coordination services include support from the care team which includes your Nurse Coordinator, Clinical Social Worker, or Pharmacist.  The Care Coordination team is here to help remove barriers to the health concerns and goals most important to you. Care Coordination services are voluntary, and the patient may decline or stop services at any time by request to their care team member.   Care Coordination Consent Status: Patient agreed to services and verbal consent obtained.   Follow up plan:  Telephone appointment with care coordination team member scheduled for:  09/11/22  Encounter Outcome:  Pt. Scheduled  [place]  Direct Dial: [phone]

## 2022-08-31 DIAGNOSIS — N2581 Secondary hyperparathyroidism of renal origin: Secondary | ICD-10-CM | POA: Diagnosis not present

## 2022-08-31 DIAGNOSIS — N186 End stage renal disease: Secondary | ICD-10-CM | POA: Diagnosis not present

## 2022-08-31 DIAGNOSIS — M19171 Post-traumatic osteoarthritis, right ankle and foot: Secondary | ICD-10-CM | POA: Insufficient documentation

## 2022-08-31 DIAGNOSIS — D631 Anemia in chronic kidney disease: Secondary | ICD-10-CM | POA: Diagnosis not present

## 2022-08-31 DIAGNOSIS — N250 Renal osteodystrophy: Secondary | ICD-10-CM | POA: Diagnosis not present

## 2022-08-31 DIAGNOSIS — D509 Iron deficiency anemia, unspecified: Secondary | ICD-10-CM | POA: Diagnosis not present

## 2022-08-31 DIAGNOSIS — Z992 Dependence on renal dialysis: Secondary | ICD-10-CM | POA: Diagnosis not present

## 2022-09-03 DIAGNOSIS — N2581 Secondary hyperparathyroidism of renal origin: Secondary | ICD-10-CM | POA: Diagnosis not present

## 2022-09-03 DIAGNOSIS — N186 End stage renal disease: Secondary | ICD-10-CM | POA: Diagnosis not present

## 2022-09-03 DIAGNOSIS — D631 Anemia in chronic kidney disease: Secondary | ICD-10-CM | POA: Diagnosis not present

## 2022-09-03 DIAGNOSIS — N250 Renal osteodystrophy: Secondary | ICD-10-CM | POA: Diagnosis not present

## 2022-09-03 DIAGNOSIS — Z992 Dependence on renal dialysis: Secondary | ICD-10-CM | POA: Diagnosis not present

## 2022-09-03 DIAGNOSIS — D509 Iron deficiency anemia, unspecified: Secondary | ICD-10-CM | POA: Diagnosis not present

## 2022-09-05 DIAGNOSIS — N250 Renal osteodystrophy: Secondary | ICD-10-CM | POA: Diagnosis not present

## 2022-09-05 DIAGNOSIS — N186 End stage renal disease: Secondary | ICD-10-CM | POA: Diagnosis not present

## 2022-09-05 DIAGNOSIS — D631 Anemia in chronic kidney disease: Secondary | ICD-10-CM | POA: Diagnosis not present

## 2022-09-05 DIAGNOSIS — N2581 Secondary hyperparathyroidism of renal origin: Secondary | ICD-10-CM | POA: Diagnosis not present

## 2022-09-05 DIAGNOSIS — D509 Iron deficiency anemia, unspecified: Secondary | ICD-10-CM | POA: Diagnosis not present

## 2022-09-05 DIAGNOSIS — Z992 Dependence on renal dialysis: Secondary | ICD-10-CM | POA: Diagnosis not present

## 2022-09-07 DIAGNOSIS — N186 End stage renal disease: Secondary | ICD-10-CM | POA: Diagnosis not present

## 2022-09-07 DIAGNOSIS — D631 Anemia in chronic kidney disease: Secondary | ICD-10-CM | POA: Diagnosis not present

## 2022-09-07 DIAGNOSIS — D509 Iron deficiency anemia, unspecified: Secondary | ICD-10-CM | POA: Diagnosis not present

## 2022-09-07 DIAGNOSIS — N250 Renal osteodystrophy: Secondary | ICD-10-CM | POA: Diagnosis not present

## 2022-09-07 DIAGNOSIS — Z992 Dependence on renal dialysis: Secondary | ICD-10-CM | POA: Diagnosis not present

## 2022-09-07 DIAGNOSIS — N2581 Secondary hyperparathyroidism of renal origin: Secondary | ICD-10-CM | POA: Diagnosis not present

## 2022-09-10 DIAGNOSIS — N2581 Secondary hyperparathyroidism of renal origin: Secondary | ICD-10-CM | POA: Diagnosis not present

## 2022-09-10 DIAGNOSIS — D631 Anemia in chronic kidney disease: Secondary | ICD-10-CM | POA: Diagnosis not present

## 2022-09-10 DIAGNOSIS — N186 End stage renal disease: Secondary | ICD-10-CM | POA: Diagnosis not present

## 2022-09-10 DIAGNOSIS — Z992 Dependence on renal dialysis: Secondary | ICD-10-CM | POA: Diagnosis not present

## 2022-09-10 DIAGNOSIS — N250 Renal osteodystrophy: Secondary | ICD-10-CM | POA: Diagnosis not present

## 2022-09-10 DIAGNOSIS — D509 Iron deficiency anemia, unspecified: Secondary | ICD-10-CM | POA: Diagnosis not present

## 2022-09-11 ENCOUNTER — Ambulatory Visit: Payer: Self-pay | Admitting: *Deleted

## 2022-09-11 ENCOUNTER — Encounter: Payer: Self-pay | Admitting: *Deleted

## 2022-09-11 NOTE — Patient Instructions (Signed)
Visit Information  Thank you for taking time to visit with me today. Please don't hesitate to contact me if I can be of assistance to you.  Following are the goals we discussed today:  Contact this care coordinator with questions/concerns.  Please call the Suicide and Crisis Lifeline: 988 call the [place] National Suicide Prevention Lifeline: (980) 315-5564 or TTY: 725-151-8147 TTY (437)524-5790) to talk to a trained counselor call 1-800-273-TALK (toll free, 24 hour hotline) call the [facility]: [phone]) [phone] call 911 if you are experiencing a Mental Health or [place] or need someone to talk to.  Patient verbalizes understanding of instructions and care plan provided today and agrees to view in [place]. Active MyChart status and patient understanding of how to access instructions and care plan via MyChart confirmed with patient.     The patient has been provided with contact information for the care management team and has been advised to call with any health related questions or concerns.   Valente David, RN, MSN, [place] Care Management Care Management Coordinator [phone]

## 2022-09-11 NOTE — Patient Outreach (Signed)
  Care Coordination   Initial Visit Note   09/11/2022 Name: Patricia [name] MRN: [id number] DOB: September 30, 1971  Patricia [name] is a 51 y.o. year old female who sees Patricia [name], [place] for primary care. I spoke with  Patricia [name] by phone today.  What matters to the patients health and wellness today?  Report doing well with management of health.  Active with CCM team at PCP office.  Biggest issue currently is intermittent ankle pain.  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  Denies any urgent concerns, encouraged to contact this care manager with questions.     Goals Addressed             This Visit's Progress    COMPLETED: Care Coordination Activities - No follow up needed       Care Coordination Interventions: Patient interviewed about adult health maintenance status including  Falls risk assessment    Regular eye checkups Regular Dental Care    Blood Pressure    Advised patient to discuss  Pneumonia Vaccine Influenza Vaccine COVID vaccination    with primary care provider  Provided education about pain management, including inquire with PCP team about water therapy for exercise and pain relief Confirmed AWV was in January this year, next PCP appointment next week          SDOH assessments and interventions completed:  Yes  SDOH Interventions Today    Flowsheet Row Most Recent Value  SDOH Interventions   Food Insecurity Interventions Intervention Not Indicated  Housing Interventions Intervention Not Indicated  Transportation Interventions Intervention Not Indicated  Utilities Interventions Intervention Not Indicated        Care Coordination Interventions Activated:  Yes  Care Coordination Interventions:  Yes, provided   Follow up plan: No further intervention required.   Encounter Outcome:  Pt. Visit Completed   Valente David, RN, MSN, [place] Care Management Care Management Coordinator [phone]

## 2022-09-12 DIAGNOSIS — Z992 Dependence on renal dialysis: Secondary | ICD-10-CM | POA: Diagnosis not present

## 2022-09-12 DIAGNOSIS — N2581 Secondary hyperparathyroidism of renal origin: Secondary | ICD-10-CM | POA: Diagnosis not present

## 2022-09-12 DIAGNOSIS — N250 Renal osteodystrophy: Secondary | ICD-10-CM | POA: Diagnosis not present

## 2022-09-12 DIAGNOSIS — D631 Anemia in chronic kidney disease: Secondary | ICD-10-CM | POA: Diagnosis not present

## 2022-09-12 DIAGNOSIS — D509 Iron deficiency anemia, unspecified: Secondary | ICD-10-CM | POA: Diagnosis not present

## 2022-09-12 DIAGNOSIS — N186 End stage renal disease: Secondary | ICD-10-CM | POA: Diagnosis not present

## 2022-09-13 ENCOUNTER — Other Ambulatory Visit: Payer: Self-pay | Admitting: "Endocrinology

## 2022-09-13 ENCOUNTER — Other Ambulatory Visit: Payer: Self-pay | Admitting: Pulmonary Disease

## 2022-09-13 DIAGNOSIS — J219 Acute bronchiolitis, unspecified: Secondary | ICD-10-CM

## 2022-09-14 DIAGNOSIS — N250 Renal osteodystrophy: Secondary | ICD-10-CM | POA: Diagnosis not present

## 2022-09-14 DIAGNOSIS — N2581 Secondary hyperparathyroidism of renal origin: Secondary | ICD-10-CM | POA: Diagnosis not present

## 2022-09-14 DIAGNOSIS — D509 Iron deficiency anemia, unspecified: Secondary | ICD-10-CM | POA: Diagnosis not present

## 2022-09-14 DIAGNOSIS — D631 Anemia in chronic kidney disease: Secondary | ICD-10-CM | POA: Diagnosis not present

## 2022-09-14 DIAGNOSIS — Z992 Dependence on renal dialysis: Secondary | ICD-10-CM | POA: Diagnosis not present

## 2022-09-14 DIAGNOSIS — N186 End stage renal disease: Secondary | ICD-10-CM | POA: Diagnosis not present

## 2022-09-15 DIAGNOSIS — Z992 Dependence on renal dialysis: Secondary | ICD-10-CM | POA: Diagnosis not present

## 2022-09-15 DIAGNOSIS — N186 End stage renal disease: Secondary | ICD-10-CM | POA: Diagnosis not present

## 2022-09-16 DIAGNOSIS — N2581 Secondary hyperparathyroidism of renal origin: Secondary | ICD-10-CM | POA: Diagnosis not present

## 2022-09-16 DIAGNOSIS — N186 End stage renal disease: Secondary | ICD-10-CM | POA: Diagnosis not present

## 2022-09-16 DIAGNOSIS — D631 Anemia in chronic kidney disease: Secondary | ICD-10-CM | POA: Diagnosis not present

## 2022-09-16 DIAGNOSIS — Z23 Encounter for immunization: Secondary | ICD-10-CM | POA: Diagnosis not present

## 2022-09-16 DIAGNOSIS — N250 Renal osteodystrophy: Secondary | ICD-10-CM | POA: Diagnosis not present

## 2022-09-16 DIAGNOSIS — Z992 Dependence on renal dialysis: Secondary | ICD-10-CM | POA: Diagnosis not present

## 2022-09-16 DIAGNOSIS — D509 Iron deficiency anemia, unspecified: Secondary | ICD-10-CM | POA: Diagnosis not present

## 2022-09-17 DIAGNOSIS — D631 Anemia in chronic kidney disease: Secondary | ICD-10-CM | POA: Diagnosis not present

## 2022-09-17 DIAGNOSIS — N186 End stage renal disease: Secondary | ICD-10-CM | POA: Diagnosis not present

## 2022-09-17 DIAGNOSIS — N2581 Secondary hyperparathyroidism of renal origin: Secondary | ICD-10-CM | POA: Diagnosis not present

## 2022-09-17 DIAGNOSIS — Z23 Encounter for immunization: Secondary | ICD-10-CM | POA: Diagnosis not present

## 2022-09-17 DIAGNOSIS — D509 Iron deficiency anemia, unspecified: Secondary | ICD-10-CM | POA: Diagnosis not present

## 2022-09-17 DIAGNOSIS — Z992 Dependence on renal dialysis: Secondary | ICD-10-CM | POA: Diagnosis not present

## 2022-09-18 DIAGNOSIS — Z992 Dependence on renal dialysis: Secondary | ICD-10-CM | POA: Diagnosis not present

## 2022-09-18 DIAGNOSIS — I10 Essential (primary) hypertension: Secondary | ICD-10-CM | POA: Diagnosis not present

## 2022-09-18 DIAGNOSIS — M79642 Pain in left hand: Secondary | ICD-10-CM | POA: Diagnosis not present

## 2022-09-18 DIAGNOSIS — I120 Hypertensive chronic kidney disease with stage 5 chronic kidney disease or end stage renal disease: Secondary | ICD-10-CM | POA: Diagnosis not present

## 2022-09-18 DIAGNOSIS — R200 Anesthesia of skin: Secondary | ICD-10-CM | POA: Diagnosis not present

## 2022-09-18 DIAGNOSIS — N186 End stage renal disease: Secondary | ICD-10-CM | POA: Diagnosis not present

## 2022-09-18 DIAGNOSIS — E119 Type 2 diabetes mellitus without complications: Secondary | ICD-10-CM | POA: Diagnosis not present

## 2022-09-18 DIAGNOSIS — Z6841 Body Mass Index (BMI) 40.0 and over, adult: Secondary | ICD-10-CM | POA: Diagnosis not present

## 2022-09-18 DIAGNOSIS — E1122 Type 2 diabetes mellitus with diabetic chronic kidney disease: Secondary | ICD-10-CM | POA: Diagnosis not present

## 2022-09-19 DIAGNOSIS — Z992 Dependence on renal dialysis: Secondary | ICD-10-CM | POA: Diagnosis not present

## 2022-09-19 DIAGNOSIS — N2581 Secondary hyperparathyroidism of renal origin: Secondary | ICD-10-CM | POA: Diagnosis not present

## 2022-09-19 DIAGNOSIS — Z23 Encounter for immunization: Secondary | ICD-10-CM | POA: Diagnosis not present

## 2022-09-19 DIAGNOSIS — D509 Iron deficiency anemia, unspecified: Secondary | ICD-10-CM | POA: Diagnosis not present

## 2022-09-19 DIAGNOSIS — N186 End stage renal disease: Secondary | ICD-10-CM | POA: Diagnosis not present

## 2022-09-19 DIAGNOSIS — D631 Anemia in chronic kidney disease: Secondary | ICD-10-CM | POA: Diagnosis not present

## 2022-09-20 ENCOUNTER — Ambulatory Visit (HOSPITAL_COMMUNITY)
Admission: RE | Admit: 2022-09-20 | Discharge: 2022-09-20 | Disposition: A | Discharge status: Home / Self Care | Payer: Medicare Other | Source: Ambulatory Visit | Attending: "Endocrinology | Admitting: "Endocrinology

## 2022-09-20 DIAGNOSIS — R748 Abnormal levels of other serum enzymes: Secondary | ICD-10-CM | POA: Insufficient documentation

## 2022-09-20 DIAGNOSIS — M8588 Other specified disorders of bone density and structure, other site: Secondary | ICD-10-CM | POA: Diagnosis not present

## 2022-09-20 DIAGNOSIS — M8589 Other specified disorders of bone density and structure, multiple sites: Secondary | ICD-10-CM | POA: Diagnosis not present

## 2022-09-20 DIAGNOSIS — Z780 Asymptomatic menopausal state: Secondary | ICD-10-CM | POA: Diagnosis not present

## 2022-09-21 DIAGNOSIS — N186 End stage renal disease: Secondary | ICD-10-CM | POA: Diagnosis not present

## 2022-09-21 DIAGNOSIS — Z23 Encounter for immunization: Secondary | ICD-10-CM | POA: Diagnosis not present

## 2022-09-21 DIAGNOSIS — D631 Anemia in chronic kidney disease: Secondary | ICD-10-CM | POA: Diagnosis not present

## 2022-09-21 DIAGNOSIS — D509 Iron deficiency anemia, unspecified: Secondary | ICD-10-CM | POA: Diagnosis not present

## 2022-09-21 DIAGNOSIS — Z992 Dependence on renal dialysis: Secondary | ICD-10-CM | POA: Diagnosis not present

## 2022-09-21 DIAGNOSIS — N2581 Secondary hyperparathyroidism of renal origin: Secondary | ICD-10-CM | POA: Diagnosis not present

## 2022-09-24 DIAGNOSIS — N2581 Secondary hyperparathyroidism of renal origin: Secondary | ICD-10-CM | POA: Diagnosis not present

## 2022-09-24 DIAGNOSIS — D631 Anemia in chronic kidney disease: Secondary | ICD-10-CM | POA: Diagnosis not present

## 2022-09-24 DIAGNOSIS — N186 End stage renal disease: Secondary | ICD-10-CM | POA: Diagnosis not present

## 2022-09-24 DIAGNOSIS — D509 Iron deficiency anemia, unspecified: Secondary | ICD-10-CM | POA: Diagnosis not present

## 2022-09-24 DIAGNOSIS — Z992 Dependence on renal dialysis: Secondary | ICD-10-CM | POA: Diagnosis not present

## 2022-09-24 DIAGNOSIS — Z23 Encounter for immunization: Secondary | ICD-10-CM | POA: Diagnosis not present

## 2022-09-26 DIAGNOSIS — D631 Anemia in chronic kidney disease: Secondary | ICD-10-CM | POA: Diagnosis not present

## 2022-09-26 DIAGNOSIS — Z23 Encounter for immunization: Secondary | ICD-10-CM | POA: Diagnosis not present

## 2022-09-26 DIAGNOSIS — N2581 Secondary hyperparathyroidism of renal origin: Secondary | ICD-10-CM | POA: Diagnosis not present

## 2022-09-26 DIAGNOSIS — N186 End stage renal disease: Secondary | ICD-10-CM | POA: Diagnosis not present

## 2022-09-26 DIAGNOSIS — Z992 Dependence on renal dialysis: Secondary | ICD-10-CM | POA: Diagnosis not present

## 2022-09-26 DIAGNOSIS — D509 Iron deficiency anemia, unspecified: Secondary | ICD-10-CM | POA: Diagnosis not present

## 2022-09-28 DIAGNOSIS — Z992 Dependence on renal dialysis: Secondary | ICD-10-CM | POA: Diagnosis not present

## 2022-09-28 DIAGNOSIS — Z23 Encounter for immunization: Secondary | ICD-10-CM | POA: Diagnosis not present

## 2022-09-28 DIAGNOSIS — N2581 Secondary hyperparathyroidism of renal origin: Secondary | ICD-10-CM | POA: Diagnosis not present

## 2022-09-28 DIAGNOSIS — N186 End stage renal disease: Secondary | ICD-10-CM | POA: Diagnosis not present

## 2022-09-28 DIAGNOSIS — D509 Iron deficiency anemia, unspecified: Secondary | ICD-10-CM | POA: Diagnosis not present

## 2022-09-28 DIAGNOSIS — D631 Anemia in chronic kidney disease: Secondary | ICD-10-CM | POA: Diagnosis not present

## 2022-09-29 DIAGNOSIS — Z1231 Encounter for screening mammogram for malignant neoplasm of breast: Secondary | ICD-10-CM | POA: Diagnosis not present

## 2022-09-29 DIAGNOSIS — Z1239 Encounter for other screening for malignant neoplasm of breast: Secondary | ICD-10-CM | POA: Diagnosis not present

## 2022-09-29 DIAGNOSIS — N6311 Unspecified lump in the right breast, upper outer quadrant: Secondary | ICD-10-CM | POA: Diagnosis not present

## 2022-09-29 DIAGNOSIS — R92321 Mammographic fibroglandular density, right breast: Secondary | ICD-10-CM | POA: Diagnosis not present

## 2022-10-01 DIAGNOSIS — D631 Anemia in chronic kidney disease: Secondary | ICD-10-CM | POA: Diagnosis not present

## 2022-10-01 DIAGNOSIS — Z992 Dependence on renal dialysis: Secondary | ICD-10-CM | POA: Diagnosis not present

## 2022-10-01 DIAGNOSIS — Z23 Encounter for immunization: Secondary | ICD-10-CM | POA: Diagnosis not present

## 2022-10-01 DIAGNOSIS — D509 Iron deficiency anemia, unspecified: Secondary | ICD-10-CM | POA: Diagnosis not present

## 2022-10-01 DIAGNOSIS — N186 End stage renal disease: Secondary | ICD-10-CM | POA: Diagnosis not present

## 2022-10-01 DIAGNOSIS — N2581 Secondary hyperparathyroidism of renal origin: Secondary | ICD-10-CM | POA: Diagnosis not present

## 2022-10-02 DIAGNOSIS — N186 End stage renal disease: Secondary | ICD-10-CM | POA: Diagnosis not present

## 2022-10-02 DIAGNOSIS — Z992 Dependence on renal dialysis: Secondary | ICD-10-CM | POA: Diagnosis not present

## 2022-10-02 DIAGNOSIS — T82858A Stenosis of vascular prosthetic devices, implants and grafts, initial encounter: Secondary | ICD-10-CM | POA: Diagnosis not present

## 2022-10-03 DIAGNOSIS — D631 Anemia in chronic kidney disease: Secondary | ICD-10-CM | POA: Diagnosis not present

## 2022-10-03 DIAGNOSIS — D509 Iron deficiency anemia, unspecified: Secondary | ICD-10-CM | POA: Diagnosis not present

## 2022-10-03 DIAGNOSIS — N2581 Secondary hyperparathyroidism of renal origin: Secondary | ICD-10-CM | POA: Diagnosis not present

## 2022-10-03 DIAGNOSIS — Z23 Encounter for immunization: Secondary | ICD-10-CM | POA: Diagnosis not present

## 2022-10-03 DIAGNOSIS — Z992 Dependence on renal dialysis: Secondary | ICD-10-CM | POA: Diagnosis not present

## 2022-10-03 DIAGNOSIS — N186 End stage renal disease: Secondary | ICD-10-CM | POA: Diagnosis not present

## 2022-10-05 DIAGNOSIS — N186 End stage renal disease: Secondary | ICD-10-CM | POA: Diagnosis not present

## 2022-10-05 DIAGNOSIS — D631 Anemia in chronic kidney disease: Secondary | ICD-10-CM | POA: Diagnosis not present

## 2022-10-05 DIAGNOSIS — D509 Iron deficiency anemia, unspecified: Secondary | ICD-10-CM | POA: Diagnosis not present

## 2022-10-05 DIAGNOSIS — Z23 Encounter for immunization: Secondary | ICD-10-CM | POA: Diagnosis not present

## 2022-10-05 DIAGNOSIS — Z992 Dependence on renal dialysis: Secondary | ICD-10-CM | POA: Diagnosis not present

## 2022-10-05 DIAGNOSIS — N2581 Secondary hyperparathyroidism of renal origin: Secondary | ICD-10-CM | POA: Diagnosis not present

## 2022-10-08 DIAGNOSIS — Z23 Encounter for immunization: Secondary | ICD-10-CM | POA: Diagnosis not present

## 2022-10-08 DIAGNOSIS — N2581 Secondary hyperparathyroidism of renal origin: Secondary | ICD-10-CM | POA: Diagnosis not present

## 2022-10-08 DIAGNOSIS — Z992 Dependence on renal dialysis: Secondary | ICD-10-CM | POA: Diagnosis not present

## 2022-10-08 DIAGNOSIS — N186 End stage renal disease: Secondary | ICD-10-CM | POA: Diagnosis not present

## 2022-10-08 DIAGNOSIS — D631 Anemia in chronic kidney disease: Secondary | ICD-10-CM | POA: Diagnosis not present

## 2022-10-08 DIAGNOSIS — D509 Iron deficiency anemia, unspecified: Secondary | ICD-10-CM | POA: Diagnosis not present

## 2022-10-10 DIAGNOSIS — D509 Iron deficiency anemia, unspecified: Secondary | ICD-10-CM | POA: Diagnosis not present

## 2022-10-10 DIAGNOSIS — N2581 Secondary hyperparathyroidism of renal origin: Secondary | ICD-10-CM | POA: Diagnosis not present

## 2022-10-10 DIAGNOSIS — D631 Anemia in chronic kidney disease: Secondary | ICD-10-CM | POA: Diagnosis not present

## 2022-10-10 DIAGNOSIS — Z992 Dependence on renal dialysis: Secondary | ICD-10-CM | POA: Diagnosis not present

## 2022-10-10 DIAGNOSIS — Z23 Encounter for immunization: Secondary | ICD-10-CM | POA: Diagnosis not present

## 2022-10-10 DIAGNOSIS — N186 End stage renal disease: Secondary | ICD-10-CM | POA: Diagnosis not present

## 2022-10-12 DIAGNOSIS — D631 Anemia in chronic kidney disease: Secondary | ICD-10-CM | POA: Diagnosis not present

## 2022-10-12 DIAGNOSIS — N186 End stage renal disease: Secondary | ICD-10-CM | POA: Diagnosis not present

## 2022-10-12 DIAGNOSIS — D509 Iron deficiency anemia, unspecified: Secondary | ICD-10-CM | POA: Diagnosis not present

## 2022-10-12 DIAGNOSIS — Z992 Dependence on renal dialysis: Secondary | ICD-10-CM | POA: Diagnosis not present

## 2022-10-12 DIAGNOSIS — N2581 Secondary hyperparathyroidism of renal origin: Secondary | ICD-10-CM | POA: Diagnosis not present

## 2022-10-12 DIAGNOSIS — Z23 Encounter for immunization: Secondary | ICD-10-CM | POA: Diagnosis not present

## 2022-10-15 DIAGNOSIS — Z992 Dependence on renal dialysis: Secondary | ICD-10-CM | POA: Diagnosis not present

## 2022-10-15 DIAGNOSIS — D509 Iron deficiency anemia, unspecified: Secondary | ICD-10-CM | POA: Diagnosis not present

## 2022-10-15 DIAGNOSIS — Z23 Encounter for immunization: Secondary | ICD-10-CM | POA: Diagnosis not present

## 2022-10-15 DIAGNOSIS — N186 End stage renal disease: Secondary | ICD-10-CM | POA: Diagnosis not present

## 2022-10-15 DIAGNOSIS — D631 Anemia in chronic kidney disease: Secondary | ICD-10-CM | POA: Diagnosis not present

## 2022-10-15 DIAGNOSIS — N2581 Secondary hyperparathyroidism of renal origin: Secondary | ICD-10-CM | POA: Diagnosis not present

## 2022-10-16 DIAGNOSIS — N186 End stage renal disease: Secondary | ICD-10-CM | POA: Diagnosis not present

## 2022-10-16 DIAGNOSIS — Z992 Dependence on renal dialysis: Secondary | ICD-10-CM | POA: Diagnosis not present

## 2022-10-17 DIAGNOSIS — N2581 Secondary hyperparathyroidism of renal origin: Secondary | ICD-10-CM | POA: Diagnosis not present

## 2022-10-17 DIAGNOSIS — N186 End stage renal disease: Secondary | ICD-10-CM | POA: Diagnosis not present

## 2022-10-17 DIAGNOSIS — D509 Iron deficiency anemia, unspecified: Secondary | ICD-10-CM | POA: Diagnosis not present

## 2022-10-17 DIAGNOSIS — Z992 Dependence on renal dialysis: Secondary | ICD-10-CM | POA: Diagnosis not present

## 2022-10-17 DIAGNOSIS — N250 Renal osteodystrophy: Secondary | ICD-10-CM | POA: Diagnosis not present

## 2022-10-17 DIAGNOSIS — D631 Anemia in chronic kidney disease: Secondary | ICD-10-CM | POA: Diagnosis not present

## 2022-10-18 DIAGNOSIS — E041 Nontoxic single thyroid nodule: Secondary | ICD-10-CM | POA: Diagnosis not present

## 2022-10-18 DIAGNOSIS — G4733 Obstructive sleep apnea (adult) (pediatric): Secondary | ICD-10-CM | POA: Diagnosis not present

## 2022-10-18 DIAGNOSIS — E782 Mixed hyperlipidemia: Secondary | ICD-10-CM | POA: Diagnosis not present

## 2022-10-18 DIAGNOSIS — Z23 Encounter for immunization: Secondary | ICD-10-CM | POA: Diagnosis not present

## 2022-10-18 DIAGNOSIS — Z6841 Body Mass Index (BMI) 40.0 and over, adult: Secondary | ICD-10-CM | POA: Diagnosis not present

## 2022-10-18 DIAGNOSIS — E119 Type 2 diabetes mellitus without complications: Secondary | ICD-10-CM | POA: Diagnosis not present

## 2022-10-18 DIAGNOSIS — C50919 Malignant neoplasm of unspecified site of unspecified female breast: Secondary | ICD-10-CM | POA: Diagnosis not present

## 2022-10-18 DIAGNOSIS — N760 Acute vaginitis: Secondary | ICD-10-CM | POA: Diagnosis not present

## 2022-10-19 DIAGNOSIS — Z992 Dependence on renal dialysis: Secondary | ICD-10-CM | POA: Diagnosis not present

## 2022-10-19 DIAGNOSIS — N250 Renal osteodystrophy: Secondary | ICD-10-CM | POA: Diagnosis not present

## 2022-10-19 DIAGNOSIS — D631 Anemia in chronic kidney disease: Secondary | ICD-10-CM | POA: Diagnosis not present

## 2022-10-19 DIAGNOSIS — N186 End stage renal disease: Secondary | ICD-10-CM | POA: Diagnosis not present

## 2022-10-19 DIAGNOSIS — D509 Iron deficiency anemia, unspecified: Secondary | ICD-10-CM | POA: Diagnosis not present

## 2022-10-19 DIAGNOSIS — N2581 Secondary hyperparathyroidism of renal origin: Secondary | ICD-10-CM | POA: Diagnosis not present

## 2022-10-22 DIAGNOSIS — N2581 Secondary hyperparathyroidism of renal origin: Secondary | ICD-10-CM | POA: Diagnosis not present

## 2022-10-22 DIAGNOSIS — Z992 Dependence on renal dialysis: Secondary | ICD-10-CM | POA: Diagnosis not present

## 2022-10-22 DIAGNOSIS — N250 Renal osteodystrophy: Secondary | ICD-10-CM | POA: Diagnosis not present

## 2022-10-22 DIAGNOSIS — D631 Anemia in chronic kidney disease: Secondary | ICD-10-CM | POA: Diagnosis not present

## 2022-10-22 DIAGNOSIS — N186 End stage renal disease: Secondary | ICD-10-CM | POA: Diagnosis not present

## 2022-10-22 DIAGNOSIS — D509 Iron deficiency anemia, unspecified: Secondary | ICD-10-CM | POA: Diagnosis not present

## 2022-10-23 ENCOUNTER — Encounter (HOSPITAL_COMMUNITY): Payer: Medicare Other

## 2022-10-23 DIAGNOSIS — Z853 Personal history of malignant neoplasm of breast: Secondary | ICD-10-CM | POA: Diagnosis not present

## 2022-10-23 DIAGNOSIS — C50812 Malignant neoplasm of overlapping sites of left female breast: Secondary | ICD-10-CM | POA: Diagnosis not present

## 2022-10-23 DIAGNOSIS — N6311 Unspecified lump in the right breast, upper outer quadrant: Secondary | ICD-10-CM | POA: Diagnosis not present

## 2022-10-23 DIAGNOSIS — Z171 Estrogen receptor negative status [ER-]: Secondary | ICD-10-CM | POA: Diagnosis not present

## 2022-10-24 ENCOUNTER — Ambulatory Visit (HOSPITAL_COMMUNITY)
Admission: RE | Admit: 2022-10-24 | Discharge: 2022-10-24 | Disposition: A | Discharge status: Home / Self Care | Payer: Medicare Other | Source: Ambulatory Visit | Attending: Pulmonary Disease | Admitting: Pulmonary Disease

## 2022-10-24 DIAGNOSIS — N2581 Secondary hyperparathyroidism of renal origin: Secondary | ICD-10-CM | POA: Diagnosis not present

## 2022-10-24 DIAGNOSIS — R062 Wheezing: Secondary | ICD-10-CM | POA: Diagnosis not present

## 2022-10-24 DIAGNOSIS — N186 End stage renal disease: Secondary | ICD-10-CM | POA: Diagnosis not present

## 2022-10-24 DIAGNOSIS — N250 Renal osteodystrophy: Secondary | ICD-10-CM | POA: Diagnosis not present

## 2022-10-24 DIAGNOSIS — Z992 Dependence on renal dialysis: Secondary | ICD-10-CM | POA: Diagnosis not present

## 2022-10-24 DIAGNOSIS — D631 Anemia in chronic kidney disease: Secondary | ICD-10-CM | POA: Diagnosis not present

## 2022-10-24 DIAGNOSIS — J4481 Bronchiolitis obliterans and bronchiolitis obliterans syndrome: Secondary | ICD-10-CM | POA: Insufficient documentation

## 2022-10-24 DIAGNOSIS — D509 Iron deficiency anemia, unspecified: Secondary | ICD-10-CM | POA: Diagnosis not present

## 2022-10-24 LAB — PULMONARY FUNCTION TEST
DL/VA % pred: 58 %
DL/VA: 2.53 ml/min/mmHg/L
DLCO unc % pred: 33 %
DLCO unc: 6.74 ml/min/mmHg
FEF 25-75 Post: 1.63 L/sec
FEF 25-75 Pre: 1.95 L/sec
FEF2575-%Change-Post: -16 %
FEF2575-%Pred-Post: 61 %
FEF2575-%Pred-Pre: 74 %
FEV1-%Change-Post: 2 %
FEV1-%Pred-Post: 56 %
FEV1-%Pred-Pre: 55 %
FEV1-Post: 1.51 L
FEV1-Pre: 1.48 L
FEV1FVC-%Change-Post: 0 %
FEV1FVC-%Pred-Pre: 107 %
FEV6-%Change-Post: 1 %
FEV6-%Pred-Post: 53 %
FEV6-%Pred-Pre: 52 %
FEV6-Post: 1.76 L
FEV6-Pre: 1.73 L
FEV6FVC-%Pred-Post: 102 %
FEV6FVC-%Pred-Pre: 102 %
FVC-%Change-Post: 1 %
FVC-%Pred-Post: 52 %
FVC-%Pred-Pre: 51 %
FVC-Post: 1.76 L
FVC-Pre: 1.73 L
Post FEV1/FVC ratio: 86 %
Post FEV6/FVC ratio: 100 %
Pre FEV1/FVC ratio: 85 %
Pre FEV6/FVC Ratio: 100 %
RV % pred: 106 %
RV: 1.88 L
TLC % pred: 72 %
TLC: 3.56 L

## 2022-10-24 MED ORDER — ALBUTEROL SULFATE (2.5 MG/3ML) 0.083% IN NEBU
2.5000 mg | INHALATION_SOLUTION | Freq: Once | RESPIRATORY_TRACT | Status: AC
  Administered 2022-10-24: 2.5 mg via RESPIRATORY_TRACT

## 2022-10-26 DIAGNOSIS — N250 Renal osteodystrophy: Secondary | ICD-10-CM | POA: Diagnosis not present

## 2022-10-26 DIAGNOSIS — D631 Anemia in chronic kidney disease: Secondary | ICD-10-CM | POA: Diagnosis not present

## 2022-10-26 DIAGNOSIS — N2581 Secondary hyperparathyroidism of renal origin: Secondary | ICD-10-CM | POA: Diagnosis not present

## 2022-10-26 DIAGNOSIS — Z992 Dependence on renal dialysis: Secondary | ICD-10-CM | POA: Diagnosis not present

## 2022-10-26 DIAGNOSIS — N186 End stage renal disease: Secondary | ICD-10-CM | POA: Diagnosis not present

## 2022-10-26 DIAGNOSIS — D509 Iron deficiency anemia, unspecified: Secondary | ICD-10-CM | POA: Diagnosis not present

## 2022-10-29 ENCOUNTER — Ambulatory Visit (HOSPITAL_COMMUNITY)
Admission: RE | Admit: 2022-10-29 | Discharge: 2022-10-29 | Disposition: A | Discharge status: Home / Self Care | Payer: Medicare Other | Source: Ambulatory Visit | Attending: Pulmonary Disease | Admitting: Pulmonary Disease

## 2022-10-29 DIAGNOSIS — J811 Chronic pulmonary edema: Secondary | ICD-10-CM | POA: Diagnosis not present

## 2022-10-29 DIAGNOSIS — I700 Atherosclerosis of aorta: Secondary | ICD-10-CM | POA: Diagnosis not present

## 2022-10-29 DIAGNOSIS — J4481 Bronchiolitis obliterans and bronchiolitis obliterans syndrome: Secondary | ICD-10-CM | POA: Diagnosis not present

## 2022-10-29 DIAGNOSIS — D631 Anemia in chronic kidney disease: Secondary | ICD-10-CM | POA: Diagnosis not present

## 2022-10-29 DIAGNOSIS — N250 Renal osteodystrophy: Secondary | ICD-10-CM | POA: Diagnosis not present

## 2022-10-29 DIAGNOSIS — Z992 Dependence on renal dialysis: Secondary | ICD-10-CM | POA: Diagnosis not present

## 2022-10-29 DIAGNOSIS — D509 Iron deficiency anemia, unspecified: Secondary | ICD-10-CM | POA: Diagnosis not present

## 2022-10-29 DIAGNOSIS — N186 End stage renal disease: Secondary | ICD-10-CM | POA: Diagnosis not present

## 2022-10-29 DIAGNOSIS — J929 Pleural plaque without asbestos: Secondary | ICD-10-CM | POA: Diagnosis not present

## 2022-10-29 DIAGNOSIS — J849 Interstitial pulmonary disease, unspecified: Secondary | ICD-10-CM | POA: Diagnosis not present

## 2022-10-29 DIAGNOSIS — R918 Other nonspecific abnormal finding of lung field: Secondary | ICD-10-CM | POA: Diagnosis not present

## 2022-10-29 DIAGNOSIS — N2581 Secondary hyperparathyroidism of renal origin: Secondary | ICD-10-CM | POA: Diagnosis not present

## 2022-10-31 DIAGNOSIS — N2581 Secondary hyperparathyroidism of renal origin: Secondary | ICD-10-CM | POA: Diagnosis not present

## 2022-10-31 DIAGNOSIS — N186 End stage renal disease: Secondary | ICD-10-CM | POA: Diagnosis not present

## 2022-10-31 DIAGNOSIS — N250 Renal osteodystrophy: Secondary | ICD-10-CM | POA: Diagnosis not present

## 2022-10-31 DIAGNOSIS — D509 Iron deficiency anemia, unspecified: Secondary | ICD-10-CM | POA: Diagnosis not present

## 2022-10-31 DIAGNOSIS — D631 Anemia in chronic kidney disease: Secondary | ICD-10-CM | POA: Diagnosis not present

## 2022-10-31 DIAGNOSIS — Z992 Dependence on renal dialysis: Secondary | ICD-10-CM | POA: Diagnosis not present

## 2022-11-01 ENCOUNTER — Encounter: Payer: Self-pay | Admitting: Pulmonary Disease

## 2022-11-01 ENCOUNTER — Ambulatory Visit (INDEPENDENT_AMBULATORY_CARE_PROVIDER_SITE_OTHER): Payer: Medicare Other | Admitting: Pulmonary Disease

## 2022-11-01 VITALS — BP 138/84 | HR 90 | Temp 98.2°F | Ht 63.0 in | Wt 245.0 lb

## 2022-11-01 DIAGNOSIS — J9611 Chronic respiratory failure with hypoxia: Secondary | ICD-10-CM | POA: Diagnosis not present

## 2022-11-01 DIAGNOSIS — I2510 Atherosclerotic heart disease of native coronary artery without angina pectoris: Secondary | ICD-10-CM

## 2022-11-01 DIAGNOSIS — J4481 Bronchiolitis obliterans and bronchiolitis obliterans syndrome: Secondary | ICD-10-CM

## 2022-11-01 DIAGNOSIS — J849 Interstitial pulmonary disease, unspecified: Secondary | ICD-10-CM | POA: Diagnosis not present

## 2022-11-01 MED ORDER — PREDNISONE 10 MG PO TABS: ORAL_TABLET | ORAL | 0 refills | Status: DC

## 2022-11-01 MED ORDER — AZITHROMYCIN 250 MG PO TABS: ORAL_TABLET | ORAL | 3 refills | Status: DC

## 2022-11-01 NOTE — Patient Instructions (Signed)
Zithromax 250 mg every Monday, Wednesday, and Friday  Prednisone 10 mg pill >> 3 pills daily for 7 days, then 2 pills daily for 7 days, then 1 pill daily until your follow up appointment  Follow up in 4 to 6 weeks

## 2022-11-01 NOTE — Progress Notes (Signed)
[facility]  Chief Complaint  Patient presents with   Follow-up    Ct done 11/14 PFT done 11/8    Constitutional:  BP 138/84   Pulse 90   Temp 98.2 F (36.8 C)   Ht _0  (1.6 m)   Wt 245 lb (111.1 kg)   SpO2 (!) 89% Comment: 2lpm pulse  BMI 43.40 kg/m   Past Medical History:  Anemia, OA, Breast cancer, DM, ESRD on iHD, GERD, HTN, COVID pneumonia January 2021  Past Surgical History:  She  has a past surgical history that includes Cholecystectomy; Cesarean section; Dilation and curettage of uterus; Abdominal hysterectomy; AV fistula placement (11/2014); Esophagogastroduodenoscopy (N/A, 07/10/2016); Balloon dilation (N/A, 07/10/2016); Colonoscopy with propofol (N/A, 09/27/2016); Mastectomy; Breast lumpectomy; ORIF ankle fracture (Right, 10/06/2018); External fixation removal (Right, 10/29/2018); and AV fistula repair (N/A, 04/2021).  Brief Summary:  Patricia [name] is a 51 y.o. 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  She had viral respiratory infection in May 2022 and developed symptoms after this.      Subjective:   PFT showed mild restriction and severe diffusion defect.  Essentially stable since 2022.  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    She gets winded with activity.  She has dry cough and sometimes gets wheezing.  Albuterol doesn't help completely.  She has been getting pain in her Lt upper chest since she had fistulagram  and was told her veins were tight.   Physical Exam:   Appearance - well kempt   ENMT - no sinus tenderness, no oral exudate, no LAN, Mallampati 3 airway, no stridor  Respiratory - equal breath sounds bilaterally, no wheezing or rales  CV - s1s2 regular rate and rhythm, no murmurs  Ext - no clubbing, no edema, AV graft Lt arm  Skin - no rashes  Psych - normal mood and affect     Pulmonary testing:   PFT 01/09/18 >> FEV1 1.60 (70%), FEV1% 89, TLC 3.26 (66%), DLCO 55% PFT 10/17/21 >> FEV1 1.28 (57%), FEV1% 89, TLC 3.28 (66%), DLCO 27% Serology 10/31/21 >> ANCA, RF negative; ESR 11 PFT 10/24/22 >> FEV1 1.51 (56%), FEV1% 86, TLC 3.56 (72%), DLCO 33%  Chest Imaging:  CT angio chest 06/26/21 >> mosaic attenuation of the airspaces, bandlike scarring at bases HRCT chest 10/11/21 >> mild, mosaic attenuation of airspaces, mild air trapping, scarring at bases CT angio chest 11/25/21 >> diffuse b/l GGO and consolidation concerning for pneumonia HRCT chest 10/30/22 >> very mild diffuse ground-glass attenuation and interlobular septal thickening, mild air trapping, patchy peripheral predominant areas of nodular airspace consolidation are also noted scattered throughout, mild septal thickening and subpleural reticulation  Cardiac Tests:  Echo 06/27/21 >> EF 55 to 60%, mild LVH, grade 2 DD, RVSP 29.6 mmHg, mild MR  Social History:  She  reports that she has never smoked. She has never used smokeless tobacco. She reports that she does not drink alcohol and does not use drugs.  Family History:  Her family history includes Diabetes Mellitus II in her mother; Heart block in her mother; Hypertension in her mother, sister, and sister.     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 - intolerant of breztri - will give trial of azithromycin 250 mg TIW and prednisone taper down to 10 mg daily until her next appointment - continue symbicort, singulair - prn albuterol -  she has a nebulizer machine  Chronic respiratory failure with hypoxia. - 2 liters oxygen with exertion and sleep - gets supplies from [facility]  Obstructive sleep apnea. - she is intolerant of CPAP  Obesity. - discussed importance of weight loss  ESRD on iHD. - followed by [facility]  Coronary artery disease, Chronic diastolic CHF. - followed by Dr. Dorris Carnes  with cardiology  Thyroid nodule. - followed by Dr. Dorris Fetch with Endocrinology  Time Spent Involved in Patient Care on Day of Examination:  37 minutes  Follow up:   Patient Instructions  Zithromax 250 mg every Monday, Wednesday, and Friday  Prednisone 10 mg pill >> 3 pills daily for 7 days, then 2 pills daily for 7 days, then 1 pill daily until your follow up appointment  Follow up in 4 to 6 weeks  Medication List:   Allergies as of 11/01/2022       Reactions   Amlodipine Besylate Rash, Other (See Comments)   dizziness   Reglan [metoclopramide] Other (See Comments)   hallucinations        Medication List        Accurate as of November 01, 2022  9:00 AM. If you have any questions, ask your nurse or doctor.          acetaminophen 325 MG tablet Commonly known as: TYLENOL Take 650 mg by mouth every 6 (six) hours as needed.   albuterol (2.5 MG/3ML) 0.083% nebulizer solution Commonly known as: PROVENTIL Take 3 mLs (2.5 mg total) by nebulization every 6 (six) hours as needed for wheezing or shortness of breath.   albuterol 108 (90 Base) MCG/ACT inhaler Commonly known as: Ventolin HFA Inhale 2 puffs into the lungs every 6 (six) hours as needed for wheezing or shortness of breath.   aspirin EC 81 MG tablet Take 1 tablet (81 mg total) by mouth daily. Swallow whole.   azithromycin 250 MG tablet Commonly known as: ZITHROMAX 1 tab every Monday, Wednesday, and Friday Started by: Chesley Mires, MD   cetirizine 10 MG tablet Commonly known as: ZYRTEC Take 10 mg by mouth daily.   cinacalcet 60 MG tablet Commonly known as: SENSIPAR Take 30 mg by mouth daily with breakfast.   dicyclomine 10 MG capsule Commonly known as: BENTYL Take 1 capsule (10 mg total) by mouth 2 (two) times daily as needed.   ferric citrate 1 GM 210 MG(Fe) tablet Commonly known as: AURYXIA Take 2 tablets by mouth 2 (two) times daily with a meal. With Breakfast & with supper   glipiZIDE 10 MG  tablet Commonly known as: GLUCOTROL Take 10 mg by mouth 2 (two) times daily.   ipratropium 0.03 % nasal spray Commonly known as: ATROVENT Place 2 sprays into both nostrils 2 (two) times daily.   labetalol 100 MG tablet Commonly known as: NORMODYNE Take 100 mg by mouth 2 (two) times daily.   linaclotide 290 MCG Caps capsule Commonly known as: LINZESS Take 290 mcg by mouth daily before breakfast.   montelukast 10 MG tablet Commonly known as: SINGULAIR TAKE 1 TABLET BY MOUTH EVERYDAY AT BEDTIME   multivitamin Tabs tablet Take 1 tablet by mouth daily.   NIFEdipine 60 MG 24 hr tablet Commonly known as: ADALAT CC Take 60 mg by mouth daily.   ondansetron 4 MG disintegrating tablet Commonly known as: ZOFRAN-ODT Take 4 mg by mouth as needed for nausea or vomiting.   pantoprazole 40 MG tablet Commonly known as: PROTONIX Take 1 tablet (40 mg total)  by mouth daily. 30 minutes before breakfast   predniSONE 10 MG tablet Commonly known as: DELTASONE Take 3 tablets (30 mg total) by mouth daily with breakfast for 7 days, THEN 2 tablets (20 mg total) daily with breakfast for 7 days, THEN 1 tablet (10 mg total) daily with breakfast for 28 days. Start taking on: November 01, 2022 Started by: Chesley Mires, MD   rOPINIRole 2 MG 24 hr tablet Commonly known as: REQUIP XL Take 2 mg by mouth at bedtime.   rosuvastatin 10 MG tablet Commonly known as: CRESTOR Take 1 tablet (10 mg total) by mouth daily.   sevelamer carbonate 800 MG tablet Commonly known as: RENVELA Take 800 mg by mouth See admin instructions. Take 2 tablets (1600 mg) in the morning, take 3 tablets (2400 mg) with lunch or snack,  and take 2 tablets (1600 mg) by mouth in the evening with dinner meal.   sodium chloride 0.65 % Soln nasal spray Commonly known as: OCEAN Place 1 spray into both nostrils as needed for congestion.   Symbicort 160-4.5 MCG/ACT inhaler Generic drug: budesonide-formoterol Inhale into the lungs.    VITAMIN D PO Take by mouth.   Vitamin D3 125 MCG (5000 UT) Caps TAKE 1 CAPSULE BY MOUTH EVERY DAY        Signature:  Chesley Mires, MD [place] Pager - [phone] 11/01/2022, 9:00 AM

## 2022-11-02 DIAGNOSIS — N2581 Secondary hyperparathyroidism of renal origin: Secondary | ICD-10-CM | POA: Diagnosis not present

## 2022-11-02 DIAGNOSIS — D509 Iron deficiency anemia, unspecified: Secondary | ICD-10-CM | POA: Diagnosis not present

## 2022-11-02 DIAGNOSIS — N250 Renal osteodystrophy: Secondary | ICD-10-CM | POA: Diagnosis not present

## 2022-11-02 DIAGNOSIS — D631 Anemia in chronic kidney disease: Secondary | ICD-10-CM | POA: Diagnosis not present

## 2022-11-02 DIAGNOSIS — N186 End stage renal disease: Secondary | ICD-10-CM | POA: Diagnosis not present

## 2022-11-02 DIAGNOSIS — Z992 Dependence on renal dialysis: Secondary | ICD-10-CM | POA: Diagnosis not present

## 2022-11-05 DIAGNOSIS — N2581 Secondary hyperparathyroidism of renal origin: Secondary | ICD-10-CM | POA: Diagnosis not present

## 2022-11-05 DIAGNOSIS — N186 End stage renal disease: Secondary | ICD-10-CM | POA: Diagnosis not present

## 2022-11-05 DIAGNOSIS — D631 Anemia in chronic kidney disease: Secondary | ICD-10-CM | POA: Diagnosis not present

## 2022-11-05 DIAGNOSIS — Z992 Dependence on renal dialysis: Secondary | ICD-10-CM | POA: Diagnosis not present

## 2022-11-05 DIAGNOSIS — N250 Renal osteodystrophy: Secondary | ICD-10-CM | POA: Diagnosis not present

## 2022-11-05 DIAGNOSIS — D509 Iron deficiency anemia, unspecified: Secondary | ICD-10-CM | POA: Diagnosis not present

## 2022-11-07 DIAGNOSIS — N2581 Secondary hyperparathyroidism of renal origin: Secondary | ICD-10-CM | POA: Diagnosis not present

## 2022-11-07 DIAGNOSIS — N250 Renal osteodystrophy: Secondary | ICD-10-CM | POA: Diagnosis not present

## 2022-11-07 DIAGNOSIS — D509 Iron deficiency anemia, unspecified: Secondary | ICD-10-CM | POA: Diagnosis not present

## 2022-11-07 DIAGNOSIS — D631 Anemia in chronic kidney disease: Secondary | ICD-10-CM | POA: Diagnosis not present

## 2022-11-07 DIAGNOSIS — N186 End stage renal disease: Secondary | ICD-10-CM | POA: Diagnosis not present

## 2022-11-07 DIAGNOSIS — Z992 Dependence on renal dialysis: Secondary | ICD-10-CM | POA: Diagnosis not present

## 2022-11-09 DIAGNOSIS — D631 Anemia in chronic kidney disease: Secondary | ICD-10-CM | POA: Diagnosis not present

## 2022-11-09 DIAGNOSIS — N2581 Secondary hyperparathyroidism of renal origin: Secondary | ICD-10-CM | POA: Diagnosis not present

## 2022-11-09 DIAGNOSIS — N186 End stage renal disease: Secondary | ICD-10-CM | POA: Diagnosis not present

## 2022-11-09 DIAGNOSIS — N250 Renal osteodystrophy: Secondary | ICD-10-CM | POA: Diagnosis not present

## 2022-11-09 DIAGNOSIS — D509 Iron deficiency anemia, unspecified: Secondary | ICD-10-CM | POA: Diagnosis not present

## 2022-11-09 DIAGNOSIS — Z992 Dependence on renal dialysis: Secondary | ICD-10-CM | POA: Diagnosis not present

## 2022-11-12 DIAGNOSIS — Z992 Dependence on renal dialysis: Secondary | ICD-10-CM | POA: Diagnosis not present

## 2022-11-12 DIAGNOSIS — N250 Renal osteodystrophy: Secondary | ICD-10-CM | POA: Diagnosis not present

## 2022-11-12 DIAGNOSIS — N186 End stage renal disease: Secondary | ICD-10-CM | POA: Diagnosis not present

## 2022-11-12 DIAGNOSIS — N2581 Secondary hyperparathyroidism of renal origin: Secondary | ICD-10-CM | POA: Diagnosis not present

## 2022-11-12 DIAGNOSIS — D509 Iron deficiency anemia, unspecified: Secondary | ICD-10-CM | POA: Diagnosis not present

## 2022-11-12 DIAGNOSIS — D631 Anemia in chronic kidney disease: Secondary | ICD-10-CM | POA: Diagnosis not present

## 2022-11-14 DIAGNOSIS — N186 End stage renal disease: Secondary | ICD-10-CM | POA: Diagnosis not present

## 2022-11-14 DIAGNOSIS — Z992 Dependence on renal dialysis: Secondary | ICD-10-CM | POA: Diagnosis not present

## 2022-11-14 DIAGNOSIS — N250 Renal osteodystrophy: Secondary | ICD-10-CM | POA: Diagnosis not present

## 2022-11-14 DIAGNOSIS — D631 Anemia in chronic kidney disease: Secondary | ICD-10-CM | POA: Diagnosis not present

## 2022-11-14 DIAGNOSIS — N2581 Secondary hyperparathyroidism of renal origin: Secondary | ICD-10-CM | POA: Diagnosis not present

## 2022-11-14 DIAGNOSIS — D509 Iron deficiency anemia, unspecified: Secondary | ICD-10-CM | POA: Diagnosis not present

## 2022-11-15 DIAGNOSIS — I10 Essential (primary) hypertension: Secondary | ICD-10-CM | POA: Diagnosis not present

## 2022-11-15 DIAGNOSIS — E782 Mixed hyperlipidemia: Secondary | ICD-10-CM | POA: Diagnosis not present

## 2022-11-15 DIAGNOSIS — N186 End stage renal disease: Secondary | ICD-10-CM | POA: Diagnosis not present

## 2022-11-15 DIAGNOSIS — Z992 Dependence on renal dialysis: Secondary | ICD-10-CM | POA: Diagnosis not present

## 2022-11-16 DIAGNOSIS — Z992 Dependence on renal dialysis: Secondary | ICD-10-CM | POA: Diagnosis not present

## 2022-11-16 DIAGNOSIS — N250 Renal osteodystrophy: Secondary | ICD-10-CM | POA: Diagnosis not present

## 2022-11-16 DIAGNOSIS — N186 End stage renal disease: Secondary | ICD-10-CM | POA: Diagnosis not present

## 2022-11-16 DIAGNOSIS — D631 Anemia in chronic kidney disease: Secondary | ICD-10-CM | POA: Diagnosis not present

## 2022-11-16 DIAGNOSIS — D509 Iron deficiency anemia, unspecified: Secondary | ICD-10-CM | POA: Diagnosis not present

## 2022-11-16 DIAGNOSIS — N2581 Secondary hyperparathyroidism of renal origin: Secondary | ICD-10-CM | POA: Diagnosis not present

## 2022-11-18 ENCOUNTER — Emergency Department (HOSPITAL_COMMUNITY): Payer: Medicare Other

## 2022-11-18 ENCOUNTER — Other Ambulatory Visit: Payer: Self-pay

## 2022-11-18 ENCOUNTER — Inpatient Hospital Stay (HOSPITAL_COMMUNITY)
Admission: EM | Admit: 2022-11-18 | Discharge: 2022-11-23 | DRG: 196 | Disposition: A | Discharge status: Home / Self Care | Payer: Medicare Other | Attending: Internal Medicine | Admitting: Internal Medicine

## 2022-11-18 ENCOUNTER — Encounter (HOSPITAL_COMMUNITY): Payer: Self-pay

## 2022-11-18 DIAGNOSIS — Z7982 Long term (current) use of aspirin: Secondary | ICD-10-CM

## 2022-11-18 DIAGNOSIS — D631 Anemia in chronic kidney disease: Secondary | ICD-10-CM | POA: Diagnosis not present

## 2022-11-18 DIAGNOSIS — Z90710 Acquired absence of both cervix and uterus: Secondary | ICD-10-CM

## 2022-11-18 DIAGNOSIS — I161 Hypertensive emergency: Secondary | ICD-10-CM | POA: Diagnosis present

## 2022-11-18 DIAGNOSIS — J9601 Acute respiratory failure with hypoxia: Secondary | ICD-10-CM | POA: Diagnosis present

## 2022-11-18 DIAGNOSIS — K760 Fatty (change of) liver, not elsewhere classified: Secondary | ICD-10-CM | POA: Diagnosis present

## 2022-11-18 DIAGNOSIS — R918 Other nonspecific abnormal finding of lung field: Secondary | ICD-10-CM | POA: Diagnosis not present

## 2022-11-18 DIAGNOSIS — N186 End stage renal disease: Secondary | ICD-10-CM | POA: Diagnosis present

## 2022-11-18 DIAGNOSIS — J8489 Other specified interstitial pulmonary diseases: Secondary | ICD-10-CM | POA: Diagnosis not present

## 2022-11-18 DIAGNOSIS — J4481 Bronchiolitis obliterans and bronchiolitis obliterans syndrome: Secondary | ICD-10-CM | POA: Diagnosis not present

## 2022-11-18 DIAGNOSIS — D649 Anemia, unspecified: Secondary | ICD-10-CM | POA: Diagnosis not present

## 2022-11-18 DIAGNOSIS — G4733 Obstructive sleep apnea (adult) (pediatric): Secondary | ICD-10-CM | POA: Diagnosis present

## 2022-11-18 DIAGNOSIS — D500 Iron deficiency anemia secondary to blood loss (chronic): Secondary | ICD-10-CM | POA: Diagnosis present

## 2022-11-18 DIAGNOSIS — Z9012 Acquired absence of left breast and nipple: Secondary | ICD-10-CM

## 2022-11-18 DIAGNOSIS — J810 Acute pulmonary edema: Secondary | ICD-10-CM | POA: Diagnosis present

## 2022-11-18 DIAGNOSIS — Z8249 Family history of ischemic heart disease and other diseases of the circulatory system: Secondary | ICD-10-CM | POA: Diagnosis not present

## 2022-11-18 DIAGNOSIS — I132 Hypertensive heart and chronic kidney disease with heart failure and with stage 5 chronic kidney disease, or end stage renal disease: Secondary | ICD-10-CM | POA: Diagnosis present

## 2022-11-18 DIAGNOSIS — I2489 Other forms of acute ischemic heart disease: Secondary | ICD-10-CM | POA: Diagnosis present

## 2022-11-18 DIAGNOSIS — J9621 Acute and chronic respiratory failure with hypoxia: Secondary | ICD-10-CM | POA: Diagnosis present

## 2022-11-18 DIAGNOSIS — J849 Interstitial pulmonary disease, unspecified: Secondary | ICD-10-CM | POA: Diagnosis present

## 2022-11-18 DIAGNOSIS — E782 Mixed hyperlipidemia: Secondary | ICD-10-CM | POA: Diagnosis present

## 2022-11-18 DIAGNOSIS — R0602 Shortness of breath: Secondary | ICD-10-CM | POA: Diagnosis not present

## 2022-11-18 DIAGNOSIS — E1122 Type 2 diabetes mellitus with diabetic chronic kidney disease: Secondary | ICD-10-CM | POA: Diagnosis not present

## 2022-11-18 DIAGNOSIS — Z888 Allergy status to other drugs, medicaments and biological substances status: Secondary | ICD-10-CM

## 2022-11-18 DIAGNOSIS — N250 Renal osteodystrophy: Secondary | ICD-10-CM | POA: Diagnosis not present

## 2022-11-18 DIAGNOSIS — Z853 Personal history of malignant neoplasm of breast: Secondary | ICD-10-CM

## 2022-11-18 DIAGNOSIS — D509 Iron deficiency anemia, unspecified: Secondary | ICD-10-CM | POA: Diagnosis not present

## 2022-11-18 DIAGNOSIS — K921 Melena: Secondary | ICD-10-CM | POA: Diagnosis present

## 2022-11-18 DIAGNOSIS — Z992 Dependence on renal dialysis: Secondary | ICD-10-CM

## 2022-11-18 DIAGNOSIS — Z1152 Encounter for screening for COVID-19: Secondary | ICD-10-CM

## 2022-11-18 DIAGNOSIS — K761 Chronic passive congestion of liver: Secondary | ICD-10-CM | POA: Diagnosis present

## 2022-11-18 DIAGNOSIS — K5909 Other constipation: Secondary | ICD-10-CM | POA: Diagnosis present

## 2022-11-18 DIAGNOSIS — R945 Abnormal results of liver function studies: Secondary | ICD-10-CM | POA: Diagnosis not present

## 2022-11-18 DIAGNOSIS — Z79899 Other long term (current) drug therapy: Secondary | ICD-10-CM | POA: Diagnosis not present

## 2022-11-18 DIAGNOSIS — E1165 Type 2 diabetes mellitus with hyperglycemia: Secondary | ICD-10-CM | POA: Diagnosis present

## 2022-11-18 DIAGNOSIS — J189 Pneumonia, unspecified organism: Secondary | ICD-10-CM | POA: Diagnosis not present

## 2022-11-18 DIAGNOSIS — K219 Gastro-esophageal reflux disease without esophagitis: Secondary | ICD-10-CM | POA: Diagnosis present

## 2022-11-18 DIAGNOSIS — N2581 Secondary hyperparathyroidism of renal origin: Secondary | ICD-10-CM | POA: Diagnosis not present

## 2022-11-18 DIAGNOSIS — Z6841 Body Mass Index (BMI) 40.0 and over, adult: Secondary | ICD-10-CM | POA: Diagnosis not present

## 2022-11-18 DIAGNOSIS — Z833 Family history of diabetes mellitus: Secondary | ICD-10-CM

## 2022-11-18 DIAGNOSIS — I5033 Acute on chronic diastolic (congestive) heart failure: Secondary | ICD-10-CM | POA: Diagnosis present

## 2022-11-18 DIAGNOSIS — I120 Hypertensive chronic kidney disease with stage 5 chronic kidney disease or end stage renal disease: Secondary | ICD-10-CM | POA: Diagnosis not present

## 2022-11-18 DIAGNOSIS — I2510 Atherosclerotic heart disease of native coronary artery without angina pectoris: Secondary | ICD-10-CM | POA: Diagnosis present

## 2022-11-18 DIAGNOSIS — G4730 Sleep apnea, unspecified: Secondary | ICD-10-CM | POA: Diagnosis not present

## 2022-11-18 DIAGNOSIS — R079 Chest pain, unspecified: Secondary | ICD-10-CM | POA: Diagnosis not present

## 2022-11-18 DIAGNOSIS — J811 Chronic pulmonary edema: Secondary | ICD-10-CM | POA: Diagnosis not present

## 2022-11-18 DIAGNOSIS — Z9049 Acquired absence of other specified parts of digestive tract: Secondary | ICD-10-CM | POA: Diagnosis not present

## 2022-11-18 DIAGNOSIS — M1990 Unspecified osteoarthritis, unspecified site: Secondary | ICD-10-CM | POA: Diagnosis not present

## 2022-11-18 LAB — COMPREHENSIVE METABOLIC PANEL
ALT: 122 U/L — ABNORMAL HIGH (ref 0–44)
AST: 43 U/L — ABNORMAL HIGH (ref 15–41)
Albumin: 4 g/dL (ref 3.5–5.0)
Alkaline Phosphatase: 132 U/L — ABNORMAL HIGH (ref 38–126)
Anion gap: 17 — ABNORMAL HIGH (ref 5–15)
BUN: 87 mg/dL — ABNORMAL HIGH (ref 6–20)
CO2: 19 mmol/L — ABNORMAL LOW (ref 22–32)
Calcium: 8.7 mg/dL — ABNORMAL LOW (ref 8.9–10.3)
Chloride: 99 mmol/L (ref 98–111)
Creatinine, Ser: 13.04 mg/dL — ABNORMAL HIGH (ref 0.44–1.00)
GFR, Estimated: 3 mL/min — ABNORMAL LOW (ref >60)
Glucose, Bld: 427 mg/dL — ABNORMAL HIGH (ref 70–99)
Potassium: 4.4 mmol/L (ref 3.5–5.1)
Sodium: 135 mmol/L (ref 135–145)
Total Bilirubin: 0.7 mg/dL (ref 0.3–1.2)
Total Protein: 7.1 g/dL (ref 6.5–8.1)

## 2022-11-18 LAB — CBC WITH DIFFERENTIAL/PLATELET
Abs Immature Granulocytes: 0.15 10*3/uL — ABNORMAL HIGH (ref 0.00–0.07)
Basophils Absolute: 0 10*3/uL (ref 0.0–0.1)
Basophils Relative: 0 %
Eosinophils Absolute: 0.2 10*3/uL (ref 0.0–0.5)
Eosinophils Relative: 1 %
HCT: 27.3 % — ABNORMAL LOW (ref 36.0–46.0)
Hemoglobin: 8.5 g/dL — ABNORMAL LOW (ref 12.0–15.0)
Immature Granulocytes: 1 %
Lymphocytes Relative: 10 %
Lymphs Abs: 1.6 10*3/uL (ref 0.7–4.0)
MCH: 28.4 pg (ref 26.0–34.0)
MCHC: 31.1 g/dL (ref 30.0–36.0)
MCV: 91.3 fL (ref 80.0–100.0)
Monocytes Absolute: 0.6 10*3/uL (ref 0.1–1.0)
Monocytes Relative: 4 %
Neutro Abs: 13 10*3/uL — ABNORMAL HIGH (ref 1.7–7.7)
Neutrophils Relative %: 84 %
Platelets: 232 10*3/uL (ref 150–400)
RBC: 2.99 MIL/uL — ABNORMAL LOW (ref 3.87–5.11)
RDW: 15.5 % (ref 11.5–15.5)
WBC: 15.5 10*3/uL — ABNORMAL HIGH (ref 4.0–10.5)
nRBC: 0 % (ref 0.0–0.2)

## 2022-11-18 LAB — TROPONIN I (HIGH SENSITIVITY)
Troponin I (High Sensitivity): 62 ng/L — ABNORMAL HIGH (ref <18)
Troponin I (High Sensitivity): 59 ng/L — ABNORMAL HIGH (ref <18)

## 2022-11-18 LAB — HEPATITIS B SURFACE ANTIGEN: Hepatitis B Surface Ag: NONREACTIVE

## 2022-11-18 MED ORDER — HEPARIN SODIUM (PORCINE) 1000 UNIT/ML DIALYSIS: 2500.0000 [IU] | INTRAMUSCULAR | Status: DC | PRN

## 2022-11-18 MED ORDER — ALBUTEROL SULFATE HFA 108 (90 BASE) MCG/ACT IN AERS: 2.0000 | INHALATION_SPRAY | RESPIRATORY_TRACT | Status: DC | PRN

## 2022-11-18 MED ORDER — NITROGLYCERIN IN D5W 200-5 MCG/ML-% IV SOLN
50.0000 ug/min | INTRAVENOUS | Status: DC
  Administered 2022-11-18: 50 ug/min via INTRAVENOUS
  Administered 2022-11-19: 100 ug/min via INTRAVENOUS
  Administered 2022-11-19: 140 ug/min via INTRAVENOUS
  Administered 2022-11-20: 80 ug/min via INTRAVENOUS
  Filled 2022-11-18 (×4): qty 250

## 2022-11-18 MED ORDER — CHLORHEXIDINE GLUCONATE CLOTH 2 % EX PADS
6.0000 | MEDICATED_PAD | Freq: Every day | CUTANEOUS | Status: DC
  Administered 2022-11-19 – 2022-11-22 (×4): 6 via TOPICAL

## 2022-11-18 NOTE — ED Provider Notes (Signed)
[facility] EMERGENCY DEPARTMENT Provider Note   CSN: [id number] Arrival date & time: 11/18/22  1302     History Chief Complaint  Patient presents with   Shortness of Breath    Patricia [name] is a 51 y.o. female with history of end-stage renal disease with MWF dialysis, type 2 diabetes, diastolic CHF, on 2 L nasal cannula chronically presents the emergency room today for evaluation of shortness of breath it has been worsening for the past few weeks, but has been simply worsening with past 3 days.  Patient reports that she around November 14 she was placed on "an antibiotic" and a prednisone taper for her nonproductive cough and shortness of breath..  She reports that she is currently still taking the once daily pills taper that she will be on for the next 28 days.  She reports some occasional left-sided chest pain that is burning in nature that is been for the past week that is intermittent without any exacerbating or relieving factors.  She denies that she feels like her belly her legs are swollen.  Her last dialysis session was on Friday and she has not missed any previously.  She denies any abdominal pain or nausea.  She reports that she does have some occasional vomiting every once in a while for the past 3 weeks.  Not also evaluating.  Denies any dark or black stools.   Shortness of Breath Associated symptoms: chest pain, cough and vomiting   Associated symptoms: no abdominal pain and no fever        Home Medications Prior to Admission medications   Medication Sig Start Date End Date Taking? Authorizing Provider  acetaminophen (TYLENOL) 325 MG tablet Take 650 mg by mouth every 6 (six) hours as needed.    [provider]  albuterol (PROVENTIL) (2.5 MG/3ML) 0.083% nebulizer solution Take 3 mLs (2.5 mg total) by nebulization every 6 (six) hours as needed for wheezing or shortness of breath. 11/28/17   Kathie Dike, MD  albuterol (VENTOLIN HFA) 108 (90 Base) MCG/ACT inhaler  Inhale 2 puffs into the lungs every 6 (six) hours as needed for wheezing or shortness of breath. 06/28/22   Chesley Mires, MD  aspirin EC 81 MG tablet Take 1 tablet (81 mg total) by mouth daily. Swallow whole. 12/07/21   Fay Records, MD  azithromycin [facility]) 250 MG tablet 1 tab every Monday, Wednesday, and Friday 11/01/22   Chesley Mires, MD  cetirizine (ZYRTEC) 10 MG tablet Take 10 mg by mouth daily. 03/24/22   [provider]  Cholecalciferol (VITAMIN D3) 125 MCG (5000 UT) CAPS TAKE 1 CAPSULE BY MOUTH EVERY DAY 09/13/22   Cassandria Anger, MD  cinacalcet (SENSIPAR) 60 MG tablet Take 30 mg by mouth daily with breakfast.    [provider]  dicyclomine (BENTYL) 10 MG capsule Take 1 capsule (10 mg total) by mouth 2 (two) times daily as needed. 03/20/22   Annitta Needs, NP  ferric citrate (AURYXIA) 1 GM 210 MG(Fe) tablet Take 2 tablets by mouth 2 (two) times daily with a meal. With Breakfast & with supper    [provider]  glipiZIDE (GLUCOTROL) 10 MG tablet Take 10 mg by mouth 2 (two) times daily. 03/27/22   [provider]  ipratropium (ATROVENT) 0.03 % nasal spray Place 2 sprays into both nostrils 2 (two) times daily.  06/06/19   [provider]  labetalol (NORMODYNE) 100 MG tablet Take 100 mg by mouth 2 (two) times daily.  [provider]  linaclotide (LINZESS) 290 MCG CAPS capsule Take 290 mcg by mouth daily before breakfast.    [provider]  montelukast (SINGULAIR) 10 MG tablet TAKE 1 TABLET BY MOUTH EVERYDAY AT BEDTIME 09/13/22   Chesley Mires, MD  multivitamin (RENA-VIT) TABS tablet Take 1 tablet by mouth daily.    [provider]  NIFEdipine (ADALAT CC) 60 MG 24 hr tablet Take 60 mg by mouth daily.    [provider]  ondansetron (ZOFRAN-ODT) 4 MG disintegrating tablet Take 4 mg by mouth as needed for nausea or vomiting.     [provider]  pantoprazole (PROTONIX) 40 MG tablet Take 1 tablet (40 mg  total) by mouth daily. 30 minutes before breakfast 03/20/22   Annitta Needs, NP  predniSONE (DELTASONE) 10 MG tablet Take 3 tablets (30 mg total) by mouth daily with breakfast for 7 days, THEN 2 tablets (20 mg total) daily with breakfast for 7 days, THEN 1 tablet (10 mg total) daily with breakfast for 28 days. 11/01/22 12/13/22  Chesley Mires, MD  rOPINIRole (REQUIP XL) 2 MG 24 hr tablet Take 2 mg by mouth at bedtime.  09/10/16   [provider]  rosuvastatin (CRESTOR) 10 MG tablet Take 1 tablet (10 mg total) by mouth daily. 01/08/22   Fay Records, MD  sevelamer carbonate (RENVELA) 800 MG tablet Take 800 mg by mouth See admin instructions. Take 2 tablets (1600 mg) in the morning, take 3 tablets (2400 mg) with lunch or snack,  and take 2 tablets (1600 mg) by mouth in the evening with dinner meal.    [provider]  sodium chloride (OCEAN) 0.65 % SOLN nasal spray Place 1 spray into both nostrils as needed for congestion. 01/02/20   Hongalgi, Lenis Dickinson, MD  SYMBICORT 160-4.5 MCG/ACT inhaler Inhale into the lungs. 04/20/22   [provider]  VITAMIN D PO Take by mouth.    [provider]      Allergies    Amlodipine besylate and Reglan [metoclopramide]    Review of Systems   Review of Systems  Constitutional:  Negative for chills and fever.  HENT:  Negative for congestion.   Respiratory:  Positive for cough and shortness of breath.   Cardiovascular:  Positive for chest pain. Negative for leg swelling.  Gastrointestinal:  Positive for vomiting. Negative for abdominal pain and nausea.    Physical Exam Updated Vital Signs BP (!) 186/94   Pulse 78   Temp 98.1 F (36.7 C) (Oral)   Resp 13   Ht _0  (1.6 m)   Wt 111.6 kg   SpO2 98%   BMI 43.58 kg/m  Physical Exam Vitals and nursing note reviewed.  Constitutional:      General: She is not in acute distress.    Appearance: She is not ill-appearing or toxic-appearing.  HENT:     Mouth/Throat:     Mouth:  Mucous membranes are moist.  Cardiovascular:     Rate and Rhythm: Normal rate.  Pulmonary:     Comments: Diminished in the lower bases.  There is some tachypnea present, however no accessory muscle use, nasal flaring, tripoding, or cyanosis present.  Patient is almost able to speak a full sentence, but needs to take a breath half to three quarters way through.  She is talking frequently though. Chest:     Chest wall: No tenderness.  Abdominal:     Palpations: Abdomen is soft.     Tenderness: There  is no abdominal tenderness. There is no guarding or rebound.  Musculoskeletal:     Comments: Minimal trace edema to patient's bilateral lower extremities.  Skin:    General: Skin is warm and dry.  Neurological:     General: No focal deficit present.     Mental Status: She is alert.     ED Results / Procedures / Treatments   Labs (all labs ordered are listed, but only abnormal results are displayed) Labs Reviewed  CBC WITH DIFFERENTIAL/PLATELET - Abnormal; Notable for the following components:      Result Value   WBC 15.5 (*)    RBC 2.99 (*)    Hemoglobin 8.5 (*)    HCT 27.3 (*)    Neutro Abs 13.0 (*)    Abs Immature Granulocytes 0.15 (*)    All other components within normal limits  COMPREHENSIVE METABOLIC PANEL - Abnormal; Notable for the following components:   CO2 19 (*)    Glucose, Bld 427 (*)    BUN 87 (*)    Creatinine, Ser 13.04 (*)    Calcium 8.7 (*)    AST 43 (*)    ALT 122 (*)    Alkaline Phosphatase 132 (*)    GFR, Estimated 3 (*)    Anion gap 17 (*)    All other components within normal limits  TROPONIN I (HIGH SENSITIVITY) - Abnormal; Notable for the following components:   Troponin I (High Sensitivity) 62 (*)    All other components within normal limits  TROPONIN I (HIGH SENSITIVITY)    EKG None  Radiology DG Chest 2 View  Result Date: 11/18/2022 CLINICAL DATA:  Shortness of breath EXAM: CHEST - 2 VIEW COMPARISON:  10/29/2022 FINDINGS: Moderate  cardiomegaly with pulmonary vascular congestion. Diffuse bilateral interstitial and alveolar opacities. No large pleural fluid collection. No pneumothorax. IMPRESSION: Appearance suggests CHF with pulmonary edema. Electronically Signed   By: Davina Poke D.O.   On: 11/18/2022 13:48    Procedures .Critical Care  Performed by: Sherrell Puller, PA-C Authorized by: Sherrell Puller, PA-C   Critical care provider statement:    Critical care time (minutes):  60   Critical care was necessary to treat or prevent imminent or life-threatening deterioration of the following conditions:  Respiratory failure   Critical care was time spent personally by me on the following activities:  Development of treatment plan with patient or surrogate, discussions with consultants, evaluation of patient's response to treatment, examination of patient, ordering and review of laboratory studies, ordering and review of radiographic studies, ordering and performing treatments and interventions, pulse oximetry, re-evaluation of patient's condition, review of old charts and obtaining history from patient or surrogate   Care discussed with: admitting provider      Medications Ordered in ED Medications  albuterol (VENTOLIN HFA) 108 (90 Base) MCG/ACT inhaler 2 puff (has no administration in time range)    ED Course/ Medical Decision Making/ A&P Clinical Course as of 11/18/22 1821  Sun Nov 18, 2022  1621 Called RT to let them know the patient needs to be put on BiPAP [RR]  1626 DG Chest 2 View [RR]    Clinical Course User Index [RR] Sherrell Puller, PA-C                           Medical Decision Making Amount and/or Complexity of Data Reviewed Labs: ordered. Radiology: ordered. Decision-making details documented in ED Course.  Risk Prescription drug management. Decision regarding  hospitalization.   51 y/o F presents to the ER for evaluation of SOB for the past few weeks but gradually worsening over the past few  days.  Differential diagnosis includes but is limited to CHF exacerbation, volume overload, electrolyte abnormality, pneumonia, pneumothorax, PE, ACS, arrhythmia, viral illness, physical deconditioning.  Vital signs on presentation were 185/89, afebrile, normal pulse rate, breathing at 24 times per minute at 94% on her baseline 2 L.  Physical exam as noted above.  Labs and imaging were initiated in triage.  I independent reviewed and interpreted the patient's labs.  CBC shows elevated white blood cell count at 15.5 with a left shift.  She does have a new drop in her hemoglobin 8.5.  Could be anemia from chronic disease.  She denies any black stools.  For elevated white blood cell count, could be from her respiratory distress or from her prednisone use.  CMP shows elevated glucose of 427.  She is a mildly decreased bicarb at 19 with an anion gap of 17.  Her creatinine is 13.04.  Again, she is end-stage renal disease on dialysis with her last session being 2 days ago.  She does have some slightly elevated AST, ALT, and alk phos as well.  Abdomen is nontender to palpation.  Troponins were elevated, however flat.  Initial one was 62 and repeat at 59. Likely from her kidney disease.   Given the patient's increase work of breathing, although maintaining her saturation, I think this patient could benefit from BiPAP.   Consulted with nephrologist, Dr. Jonnie Finner about this patient. He agrees that this patient likely needs dialysis. He would like for me to start a nitro drip to optimize the patient's BP.   Patient was placed on BiPAP.    On re-evaluation, she has easier work of breathing and reports that she feels much better afterwards.   Secure Chat messaged Dr. Jonnie Finner reports that his dialysis nurse is on the way for dialysis.  Will proceed with medical admission.   Dr. Cruzita Lederer to admit the patient for volume overload in the setting of ESRD and CHF.   I discussed this case with my attending physician who  cosigned this note including patient's presenting symptoms, physical exam, and planned diagnostics and interventions. Attending physician stated agreement with plan or made changes to plan which were implemented.   Final Clinical Impression(s) / ED Diagnoses Final diagnoses:  ESRD needing dialysis ([place])  Acute pulmonary edema [facility])    Rx / DC Orders ED Discharge Orders     None         Sherrell Puller, PA-C 11/18/22 [place], Ankit, MD 11/19/22 [phone]

## 2022-11-18 NOTE — ED Notes (Signed)
Off BiPAP on 3 liter oxygen nasal cannula

## 2022-11-18 NOTE — ED Notes (Signed)
Dialysis nurse at bedside. BP currently 166/88. Dialysis nurse made aware of Nitro gtt infusing and current BP. She has orders to wean off or d/c Nitro gtt after dialysis started. Will hold off on titrating Nitro gtt anymore due to dialysis getting ready to start.

## 2022-11-18 NOTE — ED Triage Notes (Signed)
"  Had dialysis last on Friday, doctor put me on prednisone and antibiotics in November and I am now on the 1 a day. Yesterday I got to where I could not catch my breath at all. Can't stand hardly. Feel like I have no air in my lungs" per pt

## 2022-11-18 NOTE — Progress Notes (Signed)
Patient placed on bipap. Patient is tolerating well at this time. No complications. Patient resting comfortably. RT will continue to monitor.

## 2022-11-18 NOTE — Progress Notes (Signed)
Asked about getting dialysis done on this patient w/ fluid overload on regular dialysis. Last HD was Friday. She dialyzes at [facility] and is f/b the [employer] group. Came in to ED today w/ SOB onset earlier today. CXR shows sig CHF/ pulm edema. Pt is on home O2. In ED has required Bipap. Plan HD this evening at [facility], get vol down. In meantime while awaiting dialysis would try IV NTG.   Have d/w ED MD.   OP HD:  [facility] MWF From 2022  >> 4h 41min 400/600  Hep 1000+ 1800/hr  2/2.5 bath L AVF   Kelly Splinter, MD 11/18/2022, 5:18 PM  Recent Labs  Lab 11/18/22 1433  HGB 8.5*  ALBUMIN 4.0  CALCIUM 8.7*  CREATININE 13.04*  K 4.4    Inpatient medications:   nitroGLYCERIN     albuterol

## 2022-11-18 NOTE — ED Provider Triage Note (Signed)
Emergency Medicine Provider Triage Evaluation Note  Patricia [name] , a 51 y.o. female  was evaluated in triage.  Pt complains of SOB for the past few weeks but worsened last night. She reports that two weeks ago she was put on a Zpak but hasn't helped. She has been doing nebs, albuterol, and symbicort. She reports she has still been SOB. Reports compliancy with dialysis and her medications. Last session on Friday. SOB worse with exertion.   Review of Systems  Positive:  Negative:   Physical Exam  BP (!) 185/89 (BP Location: Right Arm)   Pulse 88   Temp 98.1 F (36.7 C) (Oral)   Resp (!) 24   Ht 5\' 3"  (1.6 m)   Wt 111.6 kg   SpO2 94%   BMI 43.58 kg/m  Gen:   Awake, no distress   Resp:   Needing to take break in the middle of the sentence.  MSK:   Moves extremities without difficulty  Other:  Increased work of breathing.  Medical Decision Making  Medically screening exam initiated at 1:26 PM.  Appropriate orders placed.  Patricia [name] was informed that the remainder of the evaluation will be completed by another provider, this initial triage assessment does not replace that evaluation, and the importance of remaining in the ED until their evaluation is complete.  On her at home O2. The patient will need to be roomed NOW.    Patricia Puller, PA-C 11/18/22 2012

## 2022-11-18 NOTE — Procedures (Signed)
    EMERGENT HEMODIALYSIS TREATMENT NOTE:   Pre-HD  168/92, MAP 113,  HR 77,  R 26,  saturating 100% on BiPAP.  NTG infusing at 110 mcg/min.  HD was performed in ED due to BiPAP and NTG infusion.  Consent obtained, left upper arm AVF was accessed without difficulty.  3.5 hour treatment was initiated with a 4L net goal and orders to taper off NTG.  UF was limited by patient's cramping which began 50 minutes into session and after removal of 1.6 L.  Heat and massage were provided with minimal relief.  Schedule of NS flushes was altered to minimize UF rate.  Pt continued to writhe in pain despite these interventions.  She refused NS bolus.  Finally, UF was suspended at her request.  She was able to taper off the NTG infusion.   RT was paged to request trial off BiPAP / with her home O2, hoping enough volume had been removed.  Pt continued to toss and turn with cramping and declared "I've already given up all the fluid I can give!"  She asked to end her session early/AMA after 2 hours.  All blood was returned and the saline bolus did quell the cramps to a tolerable level.    She tolerated removal of BiPAP and maintained saturations 93-96% on 2L O2, which she chronically uses.  Total run time: 1 hour 58 minutes Net UF:  1.6 liters  Dr. Jonnie Finner was notified of the above.  Hand-off given to Lynnea Maizes, RN.   Rockwell Alexandria, RN  [facility]

## 2022-11-18 NOTE — H&P (Signed)
History and Physical    [name] EUM:[id number] DOB: 1971/02/24 DOA: 11/18/2022  I have briefly reviewed the patient's prior medical records in [name]  PCP: Jake Samples, PA-C  Patient coming from: home  Chief Complaint: shortness of breath   HPI: [name] is a 51 y.o. female with medical history significant of ESRD on MWF HD, DM 2, HTN, OSA, chronic hypoxic respiratory failure on 2 L at home, obliterative bronchiolitis, who comes to the hospital with sudden onset shortness of breath today.  She got dialyzed as schedule II days ago on Friday, was able to tolerate the entire session.  She comes now to the hospital with worsening shortness of breath today, unable to speak in full sentences.  She denies any fever or chills, but does complain of a cough.  She saw Dr. Halford Chessman about 2 weeks ago with complaints of a chronic dyspnea, and she was placed on a trial of azithromycin 3 times weekly and a prednisone taper, which she is still taking.  She is also been complaining of intermittent sharp chest pains, coming and going.  She does not appreciate any swelling and does not feel fluid overloaded  ED Course: In the ED she is afebrile, tachypneic, hypoxic requiring BiPAP placement.  Blood pressure is in the 540G-867Y systolic.  Chest x-ray shows pulmonary edema.  Blood work reveals LFT elevation with ALT 122 and AST 43, high-sensitivity troponin 62 >> 59, hemoglobin 8.5 and a white count of 15.5.  Nephrology was consulted, she was placed on a nitroglycerin infusion due to elevated blood pressure and we are asked to admit  Review of Systems: All systems reviewed, and apart from HPI, all negative  Past Medical History:  Diagnosis Date   Anemia    Ankle fracture    Arthritis    Blood transfusion without reported diagnosis    Breast cancer ([name])    Cancer ([name])    Diabetes mellitus without complication ([name])    Dialysis patient ([name])    mon, wed, friday,    End stage renal disease  on dialysis ([place])    M/W/F [facility] in [name]   GERD (gastroesophageal reflux disease)    Hypertension    OSA (obstructive sleep apnea)    uses CPAP sometimes   Pneumonia    PONV (postoperative nausea and vomiting)    Wears glasses     Past Surgical History:  Procedure Laterality Date   ABDOMINAL HYSTERECTOMY     AV FISTULA PLACEMENT  11/2014   at [name] N/A 04/2021   BALLOON DILATION N/A 07/10/2016   Procedure: BALLOON DILATION;  Surgeon: Danie Binder, MD;  Location: [facility];  Service: Endoscopy;  Laterality: N/A;  Pyloric dilation   BREAST LUMPECTOMY     CESAREAN SECTION     CHOLECYSTECTOMY     COLONOSCOPY WITH PROPOFOL N/A 09/27/2016   Dr. Gala Romney: Internal hemorrhoids repeat colonoscopy in 10 years   DILATION AND CURETTAGE OF UTERUS     ESOPHAGOGASTRODUODENOSCOPY N/A 07/10/2016   Dr.Fields- normal esophagus, gastric stenosis was found at the pylorus, gastritis on bx, normal examined duodenun   EXTERNAL FIXATION REMOVAL Right 10/29/2018   Procedure: REMOVAL RIGHT ANKLE BIOMET ZIMMER EXTERNAL FIXATOR, SHORT LEG CAST APPLICATION;  Surgeon: Marybelle Killings, MD;  Location: [name];  Service: Orthopedics;  Laterality: Right;   MASTECTOMY     left sided   ORIF ANKLE FRACTURE Right 10/06/2018   Procedure: OPEN REDUCTION INTERNAL FIXATION (  ORIF) RIGHT ANKLE TRIMALLEOLAR;  Surgeon: Marybelle Killings, MD;  Location: [name];  Service: Orthopedics;  Laterality: Right;     reports that she has never smoked. She has never used smokeless tobacco. She reports that she does not drink alcohol and does not use drugs.  Allergies  Allergen Reactions   Amlodipine Besylate Rash and Other (See Comments)    dizziness   Reglan [Metoclopramide] Other (See Comments)    hallucinations     Family History  Problem Relation Age of Onset   Diabetes Mellitus II Mother    Hypertension Mother    Heart block Mother    Hypertension Sister    Hypertension Sister    Colon  cancer Neg Hx     Prior to Admission medications   Medication Sig Start Date End Date Taking? Authorizing Provider  acetaminophen (TYLENOL) 325 MG tablet Take 650 mg by mouth every 6 (six) hours as needed.    [provider]  albuterol (PROVENTIL) (2.5 MG/3ML) 0.083% nebulizer solution Take 3 mLs (2.5 mg total) by nebulization every 6 (six) hours as needed for wheezing or shortness of breath. 11/28/17   Kathie Dike, MD  albuterol (VENTOLIN HFA) 108 (90 Base) MCG/ACT inhaler Inhale 2 puffs into the lungs every 6 (six) hours as needed for wheezing or shortness of breath. 06/28/22   Chesley Mires, MD  aspirin EC 81 MG tablet Take 1 tablet (81 mg total) by mouth daily. Swallow whole. 12/07/21   Fay Records, MD  azithromycin [facility]) 250 MG tablet 1 tab every Monday, Wednesday, and Friday 11/01/22   Chesley Mires, MD  cetirizine (ZYRTEC) 10 MG tablet Take 10 mg by mouth daily. 03/24/22   [provider]  Cholecalciferol (VITAMIN D3) 125 MCG (5000 UT) CAPS TAKE 1 CAPSULE BY MOUTH EVERY DAY 09/13/22   Cassandria Anger, MD  cinacalcet (SENSIPAR) 60 MG tablet Take 30 mg by mouth daily with breakfast.    [provider]  dicyclomine (BENTYL) 10 MG capsule Take 1 capsule (10 mg total) by mouth 2 (two) times daily as needed. 03/20/22   Annitta Needs, NP  ferric citrate (AURYXIA) 1 GM 210 MG(Fe) tablet Take 2 tablets by mouth 2 (two) times daily with a meal. With Breakfast & with supper    [provider]  glipiZIDE (GLUCOTROL) 10 MG tablet Take 10 mg by mouth 2 (two) times daily. 03/27/22   [provider]  ipratropium (ATROVENT) 0.03 % nasal spray Place 2 sprays into both nostrils 2 (two) times daily.  06/06/19   [provider]  labetalol (NORMODYNE) 100 MG tablet Take 100 mg by mouth 2 (two) times daily.    [provider]  linaclotide (LINZESS) 290 MCG CAPS capsule Take 290 mcg by mouth daily before breakfast.    [provider]   montelukast (SINGULAIR) 10 MG tablet TAKE 1 TABLET BY MOUTH EVERYDAY AT BEDTIME 09/13/22   Chesley Mires, MD  multivitamin (RENA-VIT) TABS tablet Take 1 tablet by mouth daily.    [provider]  NIFEdipine (ADALAT CC) 60 MG 24 hr tablet Take 60 mg by mouth daily.    [provider]  ondansetron (ZOFRAN-ODT) 4 MG disintegrating tablet Take 4 mg by mouth as needed for nausea or vomiting.     [provider]  pantoprazole (PROTONIX) 40 MG tablet Take 1 tablet (40 mg total) by mouth daily. 30 minutes before breakfast 03/20/22   Annitta Needs, NP  predniSONE (DELTASONE) 10 MG tablet  Take 3 tablets (30 mg total) by mouth daily with breakfast for 7 days, THEN 2 tablets (20 mg total) daily with breakfast for 7 days, THEN 1 tablet (10 mg total) daily with breakfast for 28 days. 11/01/22 12/13/22  Chesley Mires, MD  rOPINIRole (REQUIP XL) 2 MG 24 hr tablet Take 2 mg by mouth at bedtime.  09/10/16   [provider]  rosuvastatin (CRESTOR) 10 MG tablet Take 1 tablet (10 mg total) by mouth daily. 01/08/22   Fay Records, MD  sevelamer carbonate (RENVELA) 800 MG tablet Take 800 mg by mouth See admin instructions. Take 2 tablets (1600 mg) in the morning, take 3 tablets (2400 mg) with lunch or snack,  and take 2 tablets (1600 mg) by mouth in the evening with dinner meal.    [provider]  sodium chloride (OCEAN) 0.65 % SOLN nasal spray Place 1 spray into both nostrils as needed for congestion. 01/02/20   Hongalgi, Lenis Dickinson, MD  SYMBICORT 160-4.5 MCG/ACT inhaler Inhale into the lungs. 04/20/22   [provider]  VITAMIN D PO Take by mouth.    [provider]    Physical Exam: Vitals:   11/18/22 1754 11/18/22 1809 11/18/22 1818 11/18/22 1830  BP: (!) 199/100 (!) 184/103 (!) 178/95 (!) 199/96  Pulse: 79 78 79 83  Resp: (!) 29 (!) 23 (!) 27 18  Temp:      TempSrc:      SpO2: 100% 100% 99% 100%  Weight:      Height:        Constitutional: Tachypneic,  but overall comfortable on BiPAP Eyes: PERRL, lids and conjunctivae normal ENMT: Mucous membranes are moist. Neck: normal, supple Respiratory: Overall distant breath sounds due to body habitus, breathing with the BiPAP.  No wheezing Cardiovascular: Regular rate and rhythm, no murmurs / rubs / gallops. No extremity edema. 2+ pedal pulses.  Abdomen: no tenderness, no masses palpated. Bowel sounds positive.  Musculoskeletal: no clubbing / cyanosis. Normal muscle tone.  Skin: no rashes, lesions, ulcers. No induration Neurologic: CN 2-12 grossly intact. Strength 5/5 in all 4.   Labs on Admission: I have personally reviewed following labs and imaging studies  CBC: Recent Labs  Lab 11/18/22 1433  WBC 15.5*  NEUTROABS 13.0*  HGB 8.5*  HCT 27.3*  MCV 91.3  PLT 086   Basic Metabolic Panel: Recent Labs  Lab 11/18/22 1433  NA 135  K 4.4  CL 99  CO2 19*  GLUCOSE 427*  BUN 87*  CREATININE 13.04*  CALCIUM 8.7*   Liver Function Tests: Recent Labs  Lab 11/18/22 1433  AST 43*  ALT 122*  ALKPHOS 132*  BILITOT 0.7  PROT 7.1  ALBUMIN 4.0   Coagulation Profile: No results for input(s): "INR", "PROTIME" in the last 168 hours. BNP (last 3 results) No results for input(s): "PROBNP" in the last 8760 hours. CBG: No results for input(s): "GLUCAP" in the last 168 hours. Thyroid Function Tests: No results for input(s): "TSH", "T4TOTAL", "FREET4", "T3FREE", "THYROIDAB" in the last 72 hours. Urine analysis:    Component Value Date/Time   COLORURINE STRAW (A) 12/22/2018 0847   APPEARANCEUR CLEAR 12/22/2018 0847   LABSPEC 1.007 12/22/2018 0847   PHURINE 9.0 (H) 12/22/2018 0847   GLUCOSEU >=500 (A) 12/22/2018 0847   HGBUR NEGATIVE 12/22/2018 [place] NEGATIVE 12/22/2018 [place] 12/22/2018 0847   PROTEINUR 100 (A) 12/22/2018 0847   NITRITE NEGATIVE 12/22/2018 0847   LEUKOCYTESUR NEGATIVE 12/22/2018  0847     Radiological Exams on Admission: DG Chest 2  View  Result Date: 11/18/2022 CLINICAL DATA:  Shortness of breath EXAM: CHEST - 2 VIEW COMPARISON:  10/29/2022 FINDINGS: Moderate cardiomegaly with pulmonary vascular congestion. Diffuse bilateral interstitial and alveolar opacities. No large pleural fluid collection. No pneumothorax. IMPRESSION: Appearance suggests CHF with pulmonary edema. Electronically Signed   By: Davina Poke D.O.   On: 11/18/2022 13:48    EKG: Independently reviewed.  Sinus rhythm  Assessment/Plan Principal problem Acute on chronic hypoxic respiratory failure due to acute pulmonary edema due to acute on chronic diastolic CHF in dialysis patient -even though chest x-ray looks with significant pulmonary edema, patient does not appreciate that she is fluid overloaded and is in disbelief.  If her breathing is not improved after dialysis will need further workup with perhaps CT scan of her chest, potential pulmonology consult, however I do anticipate that she will get better with fluid removal -Obtain a 2D echo to update given pulmonary edema as well as elevated LFTs  Active problems ILD with obliterative bronchiolitis after viral respiratory infection in May 2022 -follows Dr. Halford Chessman as an outpatient, currently on azithromycin as well as a prednisone taper.  Continue those while here.  She does not appear to have an acute infection  Acute on chronic hypoxic respiratory failure -continue BiPAP, wean off after HD as tolerated  Hypertensive urgency-continue nitroglycerin infusion.  Resume home medications, however pharmacy has not reconciled hold meds yet  Chest pain-possibly due to pulmonary edema, monitor following dialysis  Elevated troponin-flat, not in a pattern consistent with ACS, likely demand ischemia due to acute pulmonary edema  OSA-intolerant of CPAP per pulm records  Elevated LFTs -[facility], no recent history of elevated LFTs. ?  Fatty liver, obtain a 2D echo to evaluate RV, last time RV systolic function was low  normal.  Obtain right upper quadrant ultrasound.  Obesity, morbid-BMI 60 -she would benefit from weight loss  ESRD-nephrology consulted, will get dialyzed tonight  CAD, acute on chronic diastolic CHF -fluid removal with dialysis.  2D echo in the morning  DVT prophylaxis: heparin  Code Status: Full code  Family Communication: s/o at bedside Disposition Plan: home when ready  Bed Type: Stepdown Consults called: Neprhology  Obs/Inp: Obs  Marzetta Board, MD, PhD [facility]  Contact via [email or web address]  11/18/2022, 6:36 PM

## 2022-11-19 ENCOUNTER — Observation Stay (HOSPITAL_COMMUNITY): Payer: Medicare Other

## 2022-11-19 ENCOUNTER — Inpatient Hospital Stay (HOSPITAL_COMMUNITY): Payer: Medicare Other

## 2022-11-19 ENCOUNTER — Other Ambulatory Visit (HOSPITAL_COMMUNITY): Payer: Self-pay

## 2022-11-19 ENCOUNTER — Other Ambulatory Visit: Payer: Self-pay

## 2022-11-19 DIAGNOSIS — J4481 Bronchiolitis obliterans and bronchiolitis obliterans syndrome: Secondary | ICD-10-CM | POA: Diagnosis present

## 2022-11-19 DIAGNOSIS — J9621 Acute and chronic respiratory failure with hypoxia: Secondary | ICD-10-CM

## 2022-11-19 DIAGNOSIS — E782 Mixed hyperlipidemia: Secondary | ICD-10-CM | POA: Diagnosis present

## 2022-11-19 DIAGNOSIS — Z79899 Other long term (current) drug therapy: Secondary | ICD-10-CM | POA: Diagnosis not present

## 2022-11-19 DIAGNOSIS — G4733 Obstructive sleep apnea (adult) (pediatric): Secondary | ICD-10-CM | POA: Diagnosis present

## 2022-11-19 DIAGNOSIS — K760 Fatty (change of) liver, not elsewhere classified: Secondary | ICD-10-CM | POA: Diagnosis present

## 2022-11-19 DIAGNOSIS — I161 Hypertensive emergency: Secondary | ICD-10-CM | POA: Diagnosis present

## 2022-11-19 DIAGNOSIS — G4730 Sleep apnea, unspecified: Secondary | ICD-10-CM | POA: Diagnosis not present

## 2022-11-19 DIAGNOSIS — M1990 Unspecified osteoarthritis, unspecified site: Secondary | ICD-10-CM | POA: Diagnosis not present

## 2022-11-19 DIAGNOSIS — Z992 Dependence on renal dialysis: Secondary | ICD-10-CM | POA: Diagnosis not present

## 2022-11-19 DIAGNOSIS — J8489 Other specified interstitial pulmonary diseases: Secondary | ICD-10-CM | POA: Diagnosis present

## 2022-11-19 DIAGNOSIS — K761 Chronic passive congestion of liver: Secondary | ICD-10-CM | POA: Diagnosis present

## 2022-11-19 DIAGNOSIS — D649 Anemia, unspecified: Secondary | ICD-10-CM | POA: Diagnosis not present

## 2022-11-19 DIAGNOSIS — J849 Interstitial pulmonary disease, unspecified: Secondary | ICD-10-CM | POA: Diagnosis present

## 2022-11-19 DIAGNOSIS — R945 Abnormal results of liver function studies: Secondary | ICD-10-CM | POA: Diagnosis not present

## 2022-11-19 DIAGNOSIS — J810 Acute pulmonary edema: Secondary | ICD-10-CM | POA: Diagnosis present

## 2022-11-19 DIAGNOSIS — Z9049 Acquired absence of other specified parts of digestive tract: Secondary | ICD-10-CM | POA: Diagnosis not present

## 2022-11-19 DIAGNOSIS — R079 Chest pain, unspecified: Secondary | ICD-10-CM | POA: Diagnosis not present

## 2022-11-19 DIAGNOSIS — K921 Melena: Secondary | ICD-10-CM | POA: Diagnosis present

## 2022-11-19 DIAGNOSIS — J189 Pneumonia, unspecified organism: Secondary | ICD-10-CM | POA: Diagnosis not present

## 2022-11-19 DIAGNOSIS — J9601 Acute respiratory failure with hypoxia: Secondary | ICD-10-CM | POA: Diagnosis not present

## 2022-11-19 DIAGNOSIS — N186 End stage renal disease: Secondary | ICD-10-CM

## 2022-11-19 DIAGNOSIS — I5033 Acute on chronic diastolic (congestive) heart failure: Secondary | ICD-10-CM | POA: Diagnosis present

## 2022-11-19 DIAGNOSIS — Z1152 Encounter for screening for COVID-19: Secondary | ICD-10-CM | POA: Diagnosis not present

## 2022-11-19 DIAGNOSIS — Z8249 Family history of ischemic heart disease and other diseases of the circulatory system: Secondary | ICD-10-CM | POA: Diagnosis not present

## 2022-11-19 DIAGNOSIS — I2489 Other forms of acute ischemic heart disease: Secondary | ICD-10-CM | POA: Diagnosis present

## 2022-11-19 DIAGNOSIS — E1122 Type 2 diabetes mellitus with diabetic chronic kidney disease: Secondary | ICD-10-CM | POA: Diagnosis present

## 2022-11-19 DIAGNOSIS — R0602 Shortness of breath: Secondary | ICD-10-CM | POA: Diagnosis not present

## 2022-11-19 DIAGNOSIS — I132 Hypertensive heart and chronic kidney disease with heart failure and with stage 5 chronic kidney disease, or end stage renal disease: Secondary | ICD-10-CM | POA: Diagnosis present

## 2022-11-19 DIAGNOSIS — D500 Iron deficiency anemia secondary to blood loss (chronic): Secondary | ICD-10-CM | POA: Diagnosis present

## 2022-11-19 DIAGNOSIS — Z6841 Body Mass Index (BMI) 40.0 and over, adult: Secondary | ICD-10-CM | POA: Diagnosis not present

## 2022-11-19 DIAGNOSIS — N250 Renal osteodystrophy: Secondary | ICD-10-CM | POA: Diagnosis not present

## 2022-11-19 DIAGNOSIS — D631 Anemia in chronic kidney disease: Secondary | ICD-10-CM | POA: Diagnosis present

## 2022-11-19 DIAGNOSIS — E1165 Type 2 diabetes mellitus with hyperglycemia: Secondary | ICD-10-CM | POA: Diagnosis present

## 2022-11-19 LAB — ECHOCARDIOGRAM COMPLETE
AR max vel: 1.95 cm2
AV Area VTI: 2.38 cm2
AV Area mean vel: 1.88 cm2
AV Mean grad: 12 mmHg
AV Peak grad: 27.1 mmHg
Ao pk vel: 2.61 m/s
Area-P 1/2: 2.81 cm2
Height: 63 in
MV VTI: 2.79 cm2
S' Lateral: 2.5 cm
Weight: 4148.18 oz

## 2022-11-19 LAB — SEDIMENTATION RATE: Sed Rate: 15 mm/hr (ref 0–22)

## 2022-11-19 LAB — COMPREHENSIVE METABOLIC PANEL
ALT: 105 U/L — ABNORMAL HIGH (ref 0–44)
AST: 34 U/L (ref 15–41)
Albumin: 3.8 g/dL (ref 3.5–5.0)
Alkaline Phosphatase: 108 U/L (ref 38–126)
Anion gap: 13 (ref 5–15)
BUN: 68 mg/dL — ABNORMAL HIGH (ref 6–20)
CO2: 24 mmol/L (ref 22–32)
Calcium: 8.9 mg/dL (ref 8.9–10.3)
Chloride: 101 mmol/L (ref 98–111)
Creatinine, Ser: 11.14 mg/dL — ABNORMAL HIGH (ref 0.44–1.00)
GFR, Estimated: 4 mL/min — ABNORMAL LOW (ref >60)
Glucose, Bld: 109 mg/dL — ABNORMAL HIGH (ref 70–99)
Potassium: 3.4 mmol/L — ABNORMAL LOW (ref 3.5–5.1)
Sodium: 138 mmol/L (ref 135–145)
Total Bilirubin: 0.7 mg/dL (ref 0.3–1.2)
Total Protein: 6.6 g/dL (ref 6.5–8.1)

## 2022-11-19 LAB — BRAIN NATRIURETIC PEPTIDE: B Natriuretic Peptide: 152 pg/mL — ABNORMAL HIGH (ref 0.0–100.0)

## 2022-11-19 LAB — IRON AND TIBC
Iron: 34 ug/dL (ref 28–170)
Saturation Ratios: 14 % (ref 10.4–31.8)
TIBC: 236 ug/dL — ABNORMAL LOW (ref 250–450)
UIBC: 202 ug/dL

## 2022-11-19 LAB — CBC
HCT: 25.7 % — ABNORMAL LOW (ref 36.0–46.0)
Hemoglobin: 7.9 g/dL — ABNORMAL LOW (ref 12.0–15.0)
MCH: 27.8 pg (ref 26.0–34.0)
MCHC: 30.7 g/dL (ref 30.0–36.0)
MCV: 90.5 fL (ref 80.0–100.0)
Platelets: 253 10*3/uL (ref 150–400)
RBC: 2.84 MIL/uL — ABNORMAL LOW (ref 3.87–5.11)
RDW: 15.5 % (ref 11.5–15.5)
WBC: 15.4 10*3/uL — ABNORMAL HIGH (ref 4.0–10.5)
nRBC: 0 % (ref 0.0–0.2)

## 2022-11-19 LAB — GLUCOSE, CAPILLARY
Glucose-Capillary: 184 mg/dL — ABNORMAL HIGH (ref 70–99)
Glucose-Capillary: 306 mg/dL — ABNORMAL HIGH (ref 70–99)
Glucose-Capillary: 428 mg/dL — ABNORMAL HIGH (ref 70–99)

## 2022-11-19 LAB — HIV ANTIBODY (ROUTINE TESTING W REFLEX): HIV Screen 4th Generation wRfx: NONREACTIVE

## 2022-11-19 LAB — MAGNESIUM: Magnesium: 1.8 mg/dL (ref 1.7–2.4)

## 2022-11-19 LAB — MRSA NEXT GEN BY PCR, NASAL: MRSA by PCR Next Gen: NOT DETECTED

## 2022-11-19 LAB — PROCALCITONIN: Procalcitonin: 1.12 ng/mL

## 2022-11-19 LAB — FERRITIN: Ferritin: 950 ng/mL — ABNORMAL HIGH (ref 11–307)

## 2022-11-19 LAB — FOLATE: Folate: 35.2 ng/mL (ref >5.9)

## 2022-11-19 LAB — SARS CORONAVIRUS 2 BY RT PCR: SARS Coronavirus 2 by RT PCR: NEGATIVE

## 2022-11-19 LAB — VITAMIN B12: Vitamin B-12: 675 pg/mL (ref 180–914)

## 2022-11-19 MED ORDER — INSULIN ASPART 100 UNIT/ML IJ SOLN
0.0000 [IU] | Freq: Three times a day (TID) | INTRAMUSCULAR | Status: DC
  Administered 2022-11-19: 7 [IU] via SUBCUTANEOUS
  Administered 2022-11-19: 2 [IU] via SUBCUTANEOUS
  Administered 2022-11-20: 9 [IU] via SUBCUTANEOUS

## 2022-11-19 MED ORDER — ROSUVASTATIN CALCIUM 10 MG PO TABS
10.0000 mg | ORAL_TABLET | Freq: Every day | ORAL | Status: DC
  Administered 2022-11-19 – 2022-11-23 (×5): 10 mg via ORAL
  Filled 2022-11-19 (×5): qty 1

## 2022-11-19 MED ORDER — MOMETASONE FURO-FORMOTEROL FUM 200-5 MCG/ACT IN AERO
2.0000 | INHALATION_SPRAY | Freq: Two times a day (BID) | RESPIRATORY_TRACT | Status: DC
  Administered 2022-11-19 – 2022-11-23 (×8): 2 via RESPIRATORY_TRACT
  Filled 2022-11-19: qty 8.8

## 2022-11-19 MED ORDER — AZITHROMYCIN 250 MG PO TABS
250.0000 mg | ORAL_TABLET | ORAL | Status: DC
  Administered 2022-11-19 – 2022-11-23 (×3): 250 mg via ORAL
  Filled 2022-11-19 (×3): qty 1

## 2022-11-19 MED ORDER — HEPARIN SODIUM (PORCINE) 5000 UNIT/ML IJ SOLN
5000.0000 [IU] | Freq: Three times a day (TID) | INTRAMUSCULAR | Status: AC
  Administered 2022-11-19 – 2022-11-20 (×6): 5000 [IU] via SUBCUTANEOUS
  Filled 2022-11-19 (×6): qty 1

## 2022-11-19 MED ORDER — CINACALCET HCL 30 MG PO TABS
30.0000 mg | ORAL_TABLET | Freq: Every day | ORAL | Status: DC
  Administered 2022-11-19 – 2022-11-23 (×5): 30 mg via ORAL
  Filled 2022-11-19 (×5): qty 1

## 2022-11-19 MED ORDER — HYDRALAZINE HCL 20 MG/ML IJ SOLN
10.0000 mg | Freq: Four times a day (QID) | INTRAMUSCULAR | Status: DC | PRN
  Administered 2022-11-19: 10 mg via INTRAVENOUS
  Filled 2022-11-19: qty 1

## 2022-11-19 MED ORDER — ROPINIROLE HCL 1 MG PO TABS
1.0000 mg | ORAL_TABLET | Freq: Two times a day (BID) | ORAL | Status: DC
  Administered 2022-11-19 – 2022-11-23 (×9): 1 mg via ORAL
  Filled 2022-11-19 (×9): qty 1

## 2022-11-19 MED ORDER — MINOXIDIL 2.5 MG PO TABS
5.0000 mg | ORAL_TABLET | Freq: Two times a day (BID) | ORAL | Status: DC
  Administered 2022-11-19 – 2022-11-23 (×8): 5 mg via ORAL
  Filled 2022-11-19 (×15): qty 2

## 2022-11-19 MED ORDER — ROPINIROLE HCL ER 2 MG PO TB24: 2.0000 mg | ORAL_TABLET | Freq: Every day | ORAL | Status: DC

## 2022-11-19 MED ORDER — ACETAMINOPHEN 325 MG PO TABS
650.0000 mg | ORAL_TABLET | Freq: Four times a day (QID) | ORAL | Status: DC | PRN
  Administered 2022-11-19 – 2022-11-23 (×3): 650 mg via ORAL
  Filled 2022-11-19 (×3): qty 2

## 2022-11-19 MED ORDER — ACETAMINOPHEN 650 MG RE SUPP: 650.0000 mg | Freq: Four times a day (QID) | RECTAL | Status: DC | PRN

## 2022-11-19 MED ORDER — NIFEDIPINE ER OSMOTIC RELEASE 30 MG PO TB24: 60.0000 mg | ORAL_TABLET | Freq: Every evening | ORAL | Status: DC

## 2022-11-19 MED ORDER — MORPHINE SULFATE (PF) 2 MG/ML IV SOLN
2.0000 mg | Freq: Once | INTRAVENOUS | Status: AC
  Administered 2022-11-19: 2 mg via INTRAVENOUS
  Filled 2022-11-19: qty 1

## 2022-11-19 MED ORDER — NIFEDIPINE ER OSMOTIC RELEASE 30 MG PO TB24: 60.0000 mg | ORAL_TABLET | Freq: Every day | ORAL | Status: DC

## 2022-11-19 MED ORDER — LABETALOL HCL 200 MG PO TABS
200.0000 mg | ORAL_TABLET | Freq: Two times a day (BID) | ORAL | Status: DC
  Administered 2022-11-19 – 2022-11-23 (×8): 200 mg via ORAL
  Filled 2022-11-19 (×9): qty 1

## 2022-11-19 MED ORDER — INSULIN ASPART 100 UNIT/ML IJ SOLN
0.0000 [IU] | Freq: Every day | INTRAMUSCULAR | Status: DC
  Administered 2022-11-19: 5 [IU] via SUBCUTANEOUS

## 2022-11-19 MED ORDER — ASPIRIN 81 MG PO TBEC
81.0000 mg | DELAYED_RELEASE_TABLET | Freq: Every day | ORAL | Status: DC
  Administered 2022-11-19 – 2022-11-23 (×5): 81 mg via ORAL
  Filled 2022-11-19 (×5): qty 1

## 2022-11-19 MED ORDER — LINACLOTIDE 145 MCG PO CAPS
290.0000 ug | ORAL_CAPSULE | Freq: Every day | ORAL | Status: DC
  Administered 2022-11-19 – 2022-11-23 (×5): 290 ug via ORAL
  Filled 2022-11-19 (×5): qty 2

## 2022-11-19 MED ORDER — INSULIN ASPART 100 UNIT/ML IJ SOLN: 0.0000 [IU] | Freq: Every day | INTRAMUSCULAR | Status: DC

## 2022-11-19 MED ORDER — ALBUTEROL SULFATE (2.5 MG/3ML) 0.083% IN NEBU: 2.5000 mg | INHALATION_SOLUTION | Freq: Four times a day (QID) | RESPIRATORY_TRACT | Status: DC | PRN

## 2022-11-19 MED ORDER — LABETALOL HCL 200 MG PO TABS
100.0000 mg | ORAL_TABLET | Freq: Two times a day (BID) | ORAL | Status: DC
  Administered 2022-11-19: 100 mg via ORAL
  Filled 2022-11-19: qty 1

## 2022-11-19 MED ORDER — INSULIN ASPART 100 UNIT/ML IJ SOLN: 0.0000 [IU] | Freq: Three times a day (TID) | INTRAMUSCULAR | Status: DC

## 2022-11-19 MED ORDER — ONDANSETRON HCL 4 MG/2ML IJ SOLN
4.0000 mg | Freq: Four times a day (QID) | INTRAMUSCULAR | Status: DC | PRN
  Administered 2022-11-19 – 2022-11-23 (×5): 4 mg via INTRAVENOUS
  Filled 2022-11-19 (×5): qty 2

## 2022-11-19 MED ORDER — METHYLPREDNISOLONE SODIUM SUCC 40 MG IJ SOLR
40.0000 mg | Freq: Two times a day (BID) | INTRAMUSCULAR | Status: DC
  Administered 2022-11-19 – 2022-11-23 (×9): 40 mg via INTRAVENOUS
  Filled 2022-11-19 (×9): qty 1

## 2022-11-19 MED ORDER — PREDNISONE 10 MG PO TABS: 10.0000 mg | ORAL_TABLET | Freq: Every day | ORAL | Status: DC

## 2022-11-19 MED ORDER — SALINE SPRAY 0.65 % NA SOLN
1.0000 | NASAL | Status: DC | PRN
  Administered 2022-11-19: 1 via NASAL
  Filled 2022-11-19: qty 44

## 2022-11-19 MED ORDER — ONDANSETRON HCL 4 MG PO TABS: 4.0000 mg | ORAL_TABLET | Freq: Four times a day (QID) | ORAL | Status: DC | PRN

## 2022-11-19 MED ORDER — TECHNETIUM TO 99M ALBUMIN AGGREGATED
4.1700 | Freq: Once | INTRAVENOUS | Status: AC | PRN
  Administered 2022-11-19: 4.17 via INTRAVENOUS

## 2022-11-19 MED ORDER — LABETALOL HCL 200 MG PO TABS
100.0000 mg | ORAL_TABLET | Freq: Once | ORAL | Status: AC
  Administered 2022-11-19: 100 mg via ORAL
  Filled 2022-11-19: qty 1

## 2022-11-19 NOTE — Progress Notes (Signed)
Admit: 11/18/2022 LOS: 0  43F ESRD MWF [facility] with AHRF, HTN emergency  Subjective:  HD yesterday limited UF to 1.6L 2/2 cramping. BPs remain elevated, NTG gtt restarted, PO BP meds restarted  On 3L [place], uses home O2  12/03 0701 - 12/04 0700 In: -  Out: 1600   Filed Weights   11/18/22 1325 11/18/22 1923 11/19/22 0756  Weight: 111.6 kg 117.6 kg 114.5 kg    Scheduled Meds:  aspirin EC  81 mg Oral Daily   azithromycin  250 mg Oral Q M,W,F   Chlorhexidine Gluconate Cloth  6 each Topical Q0600   cinacalcet  30 mg Oral Q breakfast   heparin  5,000 Units Subcutaneous Q8H   labetalol  100 mg Oral BID   linaclotide  290 mcg Oral QAC breakfast   methylPREDNISolone (SOLU-MEDROL) injection  40 mg Intravenous Q12H   mometasone-formoterol  2 puff Inhalation BID   rOPINIRole  2 mg Oral QHS   rosuvastatin  10 mg Oral Daily   Continuous Infusions:  nitroGLYCERIN 120 mcg/min (11/19/22 0945)   PRN Meds:.acetaminophen **OR** acetaminophen, albuterol, heparin, hydrALAZINE, ondansetron **OR** ondansetron (ZOFRAN) IV  Current Labs: reviewed    Physical Exam:  Blood pressure (!) 199/80, pulse 82, temperature 97.8 F (36.6 C), temperature source Oral, resp. rate 18, height 5\' 3"  (1.6 m), weight 114.5 kg, SpO2 94 %. NAD, obese RRR CTAB, nl wob, speaks in full sentences LUE AVF +B/T S/nt/nd NCAT EOMI  A ESRD MWF LUE AVF [facility]  Urgent HD 11/18/22, off schedule Next HD 11/20/22 2K 3.5h, 2-3L UF goal, use AVF HTN emergency with AHRF: needs better BP control with PO meds which have bee nresumed. Hopefully will ween back off NTG today; more HD today unlikley to help as we had limited UF last Tx Anemia:Hb 7.9 was 8.5 at admit, CTM; outpt mgmt unclear CKDBMD: cont outpt mgmt:  OSA Obliteraive bronchiolitis followd by pulmonary   P As above, HD tomorrow Medication Issues; Preferred narcotic agents for pain control are hydromorphone, fentanyl, and methadone. Morphine  should not be used.  Baclofen should be avoided Avoid oral sodium phosphate and magnesium citrate based laxatives / bowel preps    Pearson Grippe MD 11/19/2022, 10:09 AM  Recent Labs  Lab 11/18/22 1433 11/19/22 0526  NA 135 138  K 4.4 3.4*  CL 99 101  CO2 19* 24  GLUCOSE 427* 109*  BUN 87* 68*  CREATININE 13.04* 11.14*  CALCIUM 8.7* 8.9   Recent Labs  Lab 11/18/22 1433 11/19/22 0526  WBC 15.5* 15.4*  NEUTROABS 13.0*  --   HGB 8.5* 7.9*  HCT 27.3* 25.7*  MCV 91.3 90.5  PLT 232 253

## 2022-11-19 NOTE — Progress Notes (Signed)
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  The patient states that she has been compliant with dialysis as well as her fluid intake restrictions.  She has been staying the entire time for dialysis sessions.  Her last dialysis was on 11/16/2022.  She denies any fevers, chills, headache, nausea, vomiting, diarrhea.  She had been having some chest pain.  She has a nonproductive cough but denies any hemoptysis.  She has been having some melanotic stools without any hematochezia. The patient saw Dr. Halford Chessman on 10/22/2022 for dyspnea.  She was placed on trial of azithromycin and prednisone taper.  She states that has not helped a whole lot.  ED Course: In the ED she is afebrile, tachypneic, hypoxic requiring BiPAP placement.  Blood pressure is in the 888K-800L systolic.  Chest x-ray shows pulmonary edema.  Blood work reveals LFT elevation with ALT 122 and AST 43, high-sensitivity troponin 62 >> 59, hemoglobin 8.5 and a white count of 15.5.  Nephrology was consulted, she was placed on a nitroglycerin infusion due to elevated blood pressure and we are asked to admit   Assessment/Plan: Acute on chronic respiratory failure with hypoxia -Likely multifactorial including pulmonary edema and related to her obliterans bronchiolitis -Initially on BiPAP -Had dialysis on the evening of 11/18/2022>> able to remove BiPAP -Appreciate nephrology follow-up -Obtain echocardiogram -Check COVID PCR -Start IV Solu-Medrol -Currently down to 4 L nasal cannula after dialysis  ILD with obliterative bronchiolitis after viral respiratory infection in May  2022  -consult pulmonary -start IV steroids -continue BDs -check PCT -10/29/2022 CT chest high-res>>GGO with interlobular septal thickening suggestive of edema.  New peripheral predominant areas of nodular consolidative changes-possible UIP  ESRD/fluid overload -Dialyzed in the evening 11/18/2022 -Appreciate nephrology  Hypertensive urgency -Started on nitroglycerin drip -Restart home antihypertensive medication and wean off of nitroglycerin -Restart labetalol and nifedipine  Chest pain/elevated troponin -Troponins 62>> 59 -Secondary to demand ischemia -Echocardiogram -VQ scan  Blood loss anemia -Presented with hemoglobin 8.5 -Patient hemoglobin 13.2 on 03/02/2022 -anemia panel -FOBT -GI consult  Transaminasemia -Suspect hepatic congestion -Right upper quadrant ultrasound  Diabetes mellitus type 2, controlled -NovoLog sliding scale -hold glipizide  Morbid obesity -BMI 45.93 -Lifestyle modification  OSA-intolerant of CPAP per pulm records   Mixed hyperlipidemia -Continue statin   Family Communication:   mother at bedside updated 12/4  Consultants:  renal  Code Status:  FULL   DVT Prophylaxis:  [place] Heparin    Procedures: As Listed in Progress Note Above  Antibiotics: azithro   Subjective: Patient states that she is breathing 40% better.  She complains of some chest pain.  She denies any nausea, vomiting or diarrhea abdominal pain.  She has nonproductive cough.  She has had some melena.  There is no hematochezia.  Objective: Vitals:   11/19/22 0600 11/19/22 0605 11/19/22 0620 11/19/22 0630  BP: (!) 171/92  (!) 164/69 (!) 163/73  Pulse: 83  81 80  Resp: (!) 28  (!) 24 15  Temp:  98.3 F (36.8 C)    TempSrc:  Oral    SpO2: 97%  100% 98%  Weight:      Height:  Intake/Output Summary (Last 24 hours) at 11/19/2022 3818 Last data filed at 11/18/2022 2140 Gross per 24 hour  Intake --  Output 1600 ml  Net -1600 ml   Weight change:   Exam:  General:  Pt is alert, follows commands appropriately, not in acute distress HEENT: No icterus, No thrush, No neck mass, [place]/AT Cardiovascular: RRR, S1/S2, no rubs, no gallops Respiratory: CTA bilaterally, no wheezing, no crackles, no rhonchi Abdomen: Soft/+BS, non tender, non distended, no guarding Extremities: No edema, No lymphangitis, No petechiae, No rashes, no synovitis   Data Reviewed: I have personally reviewed following labs and imaging studies Basic Metabolic Panel: Recent Labs  Lab 11/18/22 1433 11/19/22 0526  NA 135 138  K 4.4 3.4*  CL 99 101  CO2 19* 24  GLUCOSE 427* 109*  BUN 87* 68*  CREATININE 13.04* 11.14*  CALCIUM 8.7* 8.9  MG  --  1.8   Liver Function Tests: Recent Labs  Lab 11/18/22 1433 11/19/22 0526  AST 43* 34  ALT 122* 105*  ALKPHOS 132* 108  BILITOT 0.7 0.7  PROT 7.1 6.6  ALBUMIN 4.0 3.8   No results for input(s): "LIPASE", "AMYLASE" in the last 168 hours. No results for input(s): "AMMONIA" in the last 168 hours. Coagulation Profile: No results for input(s): "INR", "PROTIME" in the last 168 hours. CBC: Recent Labs  Lab 11/18/22 1433 11/19/22 0526  WBC 15.5* 15.4*  NEUTROABS 13.0*  --   HGB 8.5* 7.9*  HCT 27.3* 25.7*  MCV 91.3 90.5  PLT 232 253   Cardiac Enzymes: No results for input(s): "CKTOTAL", "CKMB", "CKMBINDEX", "TROPONINI" in the last 168 hours. BNP: Invalid input(s): "POCBNP" CBG: No results for input(s): "GLUCAP" in the last 168 hours. HbA1C: No results for input(s): "HGBA1C" in the last 72 hours. Urine analysis:    Component Value Date/Time   COLORURINE STRAW (A) 12/22/2018 0847   APPEARANCEUR CLEAR 12/22/2018 0847   LABSPEC 1.007 12/22/2018 0847   PHURINE 9.0 (H) 12/22/2018 0847   GLUCOSEU >=500 (A) 12/22/2018 0847   HGBUR NEGATIVE 12/22/2018 [place] 12/22/2018 [place] 12/22/2018 0847   PROTEINUR 100 (A) 12/22/2018 0847   NITRITE NEGATIVE 12/22/2018 0847    LEUKOCYTESUR NEGATIVE 12/22/2018 0847   Sepsis Labs: @LABRCNTIP (procalcitonin:4,lacticidven:4) )No results found for this or any previous visit (from the past 240 hour(s)).   Scheduled Meds:  aspirin EC  81 mg Oral Daily   azithromycin  250 mg Oral Q M,W,F   Chlorhexidine Gluconate Cloth  6 each Topical Q0600   cinacalcet  30 mg Oral Q breakfast   heparin  5,000 Units Subcutaneous Q8H   labetalol  100 mg Oral BID   linaclotide  290 mcg Oral QAC breakfast   mometasone-formoterol  2 puff Inhalation BID   NIFEdipine  60 mg Oral Daily   predniSONE  10 mg Oral Q breakfast   rOPINIRole  2 mg Oral QHS   rosuvastatin  10 mg Oral Daily   Continuous Infusions:  nitroGLYCERIN 120 mcg/min (11/19/22 0654)    Procedures/Studies: DG Chest 2 View  Result Date: 11/18/2022 CLINICAL DATA:  Shortness of breath EXAM: CHEST - 2 VIEW COMPARISON:  10/29/2022 FINDINGS: Moderate cardiomegaly with pulmonary vascular congestion. Diffuse bilateral interstitial and alveolar opacities. No large pleural fluid collection. No pneumothorax. IMPRESSION: Appearance suggests CHF with pulmonary edema. Electronically Signed   By: Davina Poke D.O.   On: 11/18/2022 13:48   CT Chest High Resolution  Result Date: 10/30/2022 CLINICAL DATA:  51 year old female with history of obliterative bronchiolitis. EXAM: CT CHEST WITHOUT CONTRAST TECHNIQUE: Multidetector CT imaging of the chest was performed following the standard protocol without intravenous contrast. High resolution imaging of the lungs, as well as inspiratory and expiratory imaging, was performed. RADIATION DOSE REDUCTION: This exam was performed according to the departmental dose-optimization program which includes automated exposure control, adjustment of the mA and/or kV according to patient size and/or use of iterative reconstruction technique. COMPARISON:  High-resolution chest CT 10/10/2021. FINDINGS: Cardiovascular: Heart size is mildly enlarged. There is no  significant pericardial fluid, thickening or pericardial calcification. There is aortic atherosclerosis, as well as atherosclerosis of the great vessels of the mediastinum and the coronary arteries, including calcified atherosclerotic plaque in the left main and left anterior descending coronary arteries. Mediastinum/Nodes: Enlarged lymph node or conglomeration of lymph nodes in the subcarinal nodal station (axial image 56 of series 3) measuring 2.2 cm in short axis, similar to the prior examination. No other pathologically enlarged mediastinal or hilar lymph nodes are noted on today's noncontrast CT examination. Esophagus is unremarkable in appearance. No axillary lymphadenopathy. Lungs/Pleura: High-resolution images demonstrate a background of very mild diffuse ground-glass attenuation and interlobular septal thickening, which is nonspecific, but can be seen in the setting of mild interstitial edema. Inspiratory and expiratory imaging demonstrates some very mild air trapping, suggesting mild small airways disease. Some patchy peripheral predominant areas of nodular airspace consolidation are also noted scattered throughout the lungs bilaterally, some of which are in association with some mild septal thickening and subpleural reticulation, most evident in the mid to lower lungs. No traction bronchiectasis or honeycombing. No pleural effusions. Upper Abdomen: Aortic atherosclerosis. Musculoskeletal: There are no aggressive appearing lytic or blastic lesions noted in the visualized portions of the skeleton. IMPRESSION: 1. Unusual appearance of the lungs. Given the presence of cardiomegaly and the background of ground-glass attenuation and interlobular septal thickening, much of the parenchymal changes in the lungs can be attributable to mild interstitial pulmonary edema in the setting of mild congestive heart failure. However, today's study also demonstrates new peripherally predominant areas of nodular consolidative  change in septal thickening. This is nonspecific, and could be indicative of interstitial lung disease, but would be categorized as most compatible with an alternative diagnosis (not usual interstitial pneumonia) per current ATS guidelines. Regions of cryptogenic organizing pneumonia (COP) are suspected. Repeat high-resolution chest CT is suggested in 12 months to assess for temporal changes in the appearance of the lung parenchyma. 2. Aortic atherosclerosis, in addition to left main and left anterior descending coronary artery disease. Please note that although the presence of coronary artery calcium documents the presence of coronary artery disease, the severity of this disease and any potential stenosis cannot be assessed on this non-gated CT examination. Assessment for potential risk factor modification, dietary therapy or pharmacologic therapy may be warranted, if clinically indicated. Electronically Signed   By: Vinnie Langton M.D.   On: 10/30/2022 09:14    Orson Eva, DO  [facility]  If 7PM-7AM, please contact night-coverage [email or web address] Password [email or web address] 11/19/2022, 7:22 AM   LOS: 0 days

## 2022-11-19 NOTE — Progress Notes (Incomplete)
*  PRELIMINARY RESULTS* Echocardiogram 2D Echocardiogram has been performed.  Patricia [name] 11/19/2022, 9:28 AM

## 2022-11-19 NOTE — TOC Initial Note (Signed)
Transition of Care [facility]) - Initial/Assessment Note    Patient Details  Name: Patricia [name] MRN: [id number] Date of Birth: April 06, 1971  Transition of [facility]) CM/SW Contact:    Ihor Gully, LCSW Phone Number: 11/19/2022, 4:45 PM  Clinical Narrative:                 Patient from home with son. Admitted for acute pulmonary edema. Considered high risk for readmission. On o2, 2L, provided by [employer]. Drives, Independent with ADLs and ambulation. On HD, MWF at [facility].   Expected Discharge Plan: Home/Self Care Barriers to Discharge: Continued Medical Work up   Patient Goals and CMS Choice Patient states their goals for this hospitalization and ongoing recovery are:: return home      Expected Discharge Plan and Services Expected Discharge Plan: Home/Self Care                                              Prior Living Arrangements/Services   Lives with:: Adult Children Patient language and need for interpreter reviewed:: Yes        Need for Family Participation in Patient Care: Yes (Comment) Care giver support system in place?: Yes (comment)   Criminal Activity/Legal Involvement Pertinent to Current Situation/Hospitalization: No - Comment as needed  Activities of Daily Living Home Assistive Devices/Equipment: CBG Meter, Oxygen, CPAP, Nebulizer ADL Screening (condition at time of admission) Patient's cognitive ability adequate to safely complete daily activities?: Yes Is the patient deaf or have difficulty hearing?: No Does the patient have difficulty seeing, even when wearing glasses/contacts?: No Does the patient have difficulty concentrating, remembering, or making decisions?: No Patient able to express need for assistance with ADLs?: Yes Does the patient have difficulty dressing or bathing?: No Independently performs ADLs?: Yes (appropriate for developmental age) Does the patient have difficulty walking or climbing stairs?: No Weakness of Legs:  None Weakness of Arms/Hands: None  Permission Sought/Granted                  Emotional Assessment Appearance:: Appears stated age   Affect (typically observed): Appropriate Orientation: : Oriented to Place, Oriented to Self, Oriented to  Time, Oriented to Situation      Admission diagnosis:  Acute pulmonary edema ([place]) [J81.0] ESRD needing dialysis ([place]) [N18.6, Z99.2] Acute respiratory failure with hypoxia ([place]) [J96.01] Patient Active Problem List   Diagnosis Date Noted   Obesity, Class III, BMI 40-49.9 (morbid obesity) ([place]) 11/19/2022   BOOP (bronchiolitis obliterans with organizing pneumonia) ([place]) 11/19/2022   Bronchiolitis obliterans 11/19/2022   Post-traumatic osteoarthritis, right ankle and foot 08/31/2022   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   Elevated alkaline phosphatase level 05/24/2022   Right foot sprain 12/20/2020   Contusion of left shoulder 12/20/2020   Syncope 08/03/2020   Acute pulmonary edema ([place]) 01/11/2020   OSA (obstructive sleep apnea)    Closed right ankle fracture 10/06/2018   Closed displaced trimalleolar fracture of right ankle 10/06/2018   Acute colitis 02/26/2018   Hypoxemia 02/24/2018   Dyspnea 11/26/2017   Anemia 11/26/2017   Elevated troponin 11/26/2017   Hyperkalemia 09/29/2017   Acute on chronic respiratory failure with hypoxia ([place]) 09/28/2017   CAP (community acquired pneumonia) 09/26/2017   Volume overload 09/25/2017   Acute on chronic diastolic CHF (congestive heart failure) ([place]) 09/25/2017   Lactic acidosis 09/25/2017   Hypokalemia 09/25/2017   Breast cancer (  [place]) 07/22/2017   SOB (shortness of breath)    Acute respiratory failure with hypoxia ([place]) 07/21/2017   Flatulence 10/25/2016   GERD (gastroesophageal reflux disease) 08/02/2016   Pyloric stenosis, acquired    Nausea with vomiting    Generalized abdominal pain    Chest pain, rule out acute myocardial infarction 07/04/2016   Constipation 07/04/2016   Nausea 07/04/2016    Essential hypertension 07/04/2016   HCAP (healthcare-associated pneumonia) 12/04/2015   ESRD on dialysis ([place]) 12/04/2015   Type 2 diabetes mellitus ([place]) 12/04/2015   PCP:  Jake Samples, PA-C Pharmacy:   [id number] - [place], [address] [place] [address] Phone: [phone] Fax: [phone]     Social Determinants of Health (SDOH) Interventions    Readmission Risk Interventions     No data to display

## 2022-11-19 NOTE — Evaluation (Signed)
Clinical/Bedside Swallow Evaluation Patient Details  Name: Patricia [name] MRN: [id number] Date of Birth: Mar 18, 1971  Today's Date: 11/19/2022 Time: SLP Start Time (ACUTE ONLY): 1600 SLP Stop Time (ACUTE ONLY): 1625 SLP Time Calculation (min) (ACUTE ONLY): 25 min  Past Medical History:  Past Medical History:  Diagnosis Date   Anemia    Ankle fracture    Arthritis    Blood transfusion without reported diagnosis    Breast cancer ([place])    Cancer ([place])    Diabetes mellitus without complication ([place])    Dialysis patient ([place])    mon, wed, friday,    End stage renal disease on dialysis ([place])    M/W/F [facility] in [place]   GERD (gastroesophageal reflux disease)    Hypertension    OSA (obstructive sleep apnea)    uses CPAP sometimes   Pneumonia    PONV (postoperative nausea and vomiting)    Wears glasses    Past Surgical History:  Past Surgical History:  Procedure Laterality Date   ABDOMINAL HYSTERECTOMY     AV FISTULA PLACEMENT  11/2014   at [place] N/A 04/2021   BALLOON DILATION N/A 07/10/2016   Procedure: BALLOON DILATION;  Surgeon: Danie Binder, MD;  [facility];  Service: Endoscopy;  Laterality: N/A;  Pyloric dilation   BREAST LUMPECTOMY     CESAREAN SECTION     CHOLECYSTECTOMY     COLONOSCOPY WITH PROPOFOL N/A 09/27/2016   Dr. Gala Romney: Internal hemorrhoids repeat colonoscopy in 10 years   DILATION AND CURETTAGE OF UTERUS     ESOPHAGOGASTRODUODENOSCOPY N/A 07/10/2016   Dr.Fields- normal esophagus, gastric stenosis was found at the pylorus, gastritis on bx, normal examined duodenun   EXTERNAL FIXATION REMOVAL Right 10/29/2018   Procedure: REMOVAL RIGHT ANKLE BIOMET ZIMMER EXTERNAL FIXATOR, SHORT LEG CAST APPLICATION;  Surgeon: Marybelle Killings, MD;  Location: [place];  Service: Orthopedics;  Laterality: Right;   MASTECTOMY     left sided   ORIF ANKLE FRACTURE Right 10/06/2018   Procedure: OPEN REDUCTION INTERNAL FIXATION (ORIF) RIGHT ANKLE  TRIMALLEOLAR;  Surgeon: Marybelle Killings, MD;  Location: [place];  Service: Orthopedics;  Laterality: Right;   HPI:  51 year old female with a 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  The patient states that she has been compliant with dialysis as well as her fluid intake restrictions.  She has been staying the entire time for dialysis sessions.  Her last dialysis was on 11/16/2022.  She denies any fevers, chills, headache, nausea, vomiting, diarrhea.  She had been having some chest pain.  She has a nonproductive cough but denies any hemoptysis.  She has been having some melanotic stools without any hematochezia.  The patient saw Dr. Halford Chessman on 10/22/2022 for dyspnea.  She was placed on trial of azithromycin and prednisone taper.  She states that has not helped a whole lot. In the ED she is afebrile, tachypneic, hypoxic requiring BiPAP placement.  Blood pressure is in the 350K-938H systolic.  Chest x-ray shows pulmonary edema.  Blood work reveals LFT elevation with ALT 122 and AST 43, high-sensitivity troponin 62 >> 59, hemoglobin 8.5 and a white count of 15.5.  Nephrology was consulted, she was placed on a nitroglycerin infusion due to elevated blood pressure and we are asked to admit.  Pt now down to 4L O2. BSE requested.    Assessment / Plan / Recommendation  Clinical Impression  Clinical swallowing evaluation completed while Pt was sitting upright in bed; Pt consumed thin liquids, regular textures and puree textures without overt s/sx of aspiration. Pt reports, however, that she is coughing almost every time she eats/drinks and getting "strangled" all the time. This was not observed with trials, therefore ST will f/u X1 to assess again and ensure diet tolerance. SLP reviewed universal  aspiration precautions with Pt.  Recommend continue with regular diet and thin liquids. Meds are ok whole with liquids. Thank you for this referral, SLP Visit Diagnosis: Dysphagia, unspecified (R13.10)    Aspiration Risk  Mild aspiration risk    Diet Recommendation Regular;Thin liquid   Liquid Administration via: Cup;Straw Medication Administration: Whole meds with liquid Supervision: Patient able to self feed;Intermittent supervision to cue for compensatory strategies Compensations: Minimize environmental distractions;Small sips/bites Postural Changes: Seated upright at 90 degrees    Other  Recommendations Oral Care Recommendations: Oral care BID    Recommendations for follow up therapy are one component of a multi-disciplinary discharge planning process, led by the attending physician.  Recommendations may be updated based on patient status, additional functional criteria and insurance authorization.  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  The patient states that she has been compliant with dialysis as well as her fluid intake restrictions.  She has been staying the entire time for dialysis sessions.  Her last dialysis was on 11/16/2022.  She denies any fevers, chills, headache, nausea, vomiting, diarrhea.  She had been having some chest pain.  She has a nonproductive cough but denies any hemoptysis.  She has been having some melanotic stools without any hematochezia.  The patient saw Dr. Halford Chessman on 10/22/2022 for dyspnea.  She was placed on trial of azithromycin and prednisone  taper.  She states that has not helped a whole lot. In the ED she is afebrile, tachypneic, hypoxic requiring BiPAP placement.  Blood pressure is in the 191Y-782N systolic.  Chest x-ray shows pulmonary edema.  Blood work reveals LFT elevation with ALT 122 and AST 43, high-sensitivity troponin 62 >> 59, hemoglobin 8.5 and a white count of 15.5.  Nephrology was consulted, she was placed on a nitroglycerin infusion due to elevated blood pressure and we are asked to admit. Pt now down to 4L O2. BSE requested. Type of Study: Bedside Swallow Evaluation Previous Swallow Assessment: none inchart Diet Prior to this Study: Regular;Thin liquids Temperature Spikes Noted: No Respiratory Status: Room air History of Recent Intubation: No Behavior/Cognition: Alert;Cooperative;Pleasant mood Oral Cavity Assessment: Within Functional Limits Oral Care Completed by SLP: Recent completion by staff Oral Cavity - Dentition: Adequate natural dentition Vision: Functional for self-feeding Self-Feeding Abilities: Able to feed self Patient Positioning: Upright in bed Baseline Vocal Quality: Normal Volitional Cough: Strong Volitional Swallow: Able to elicit    Oral/Motor/Sensory Function Overall Oral Motor/Sensory Function: Within functional limits   Ice Chips Ice chips: Within functional limits   Thin Liquid Thin Liquid:  Within functional limits    Nectar Thick Nectar Thick Liquid: Not tested   Honey Thick Honey Thick Liquid: Not tested   Puree Puree: Within functional limits   Solid     Solid: Within functional limits     Patricia [name], CCC-SLP Speech Language Pathologist  Wende Bushy 11/19/2022,4:25 PM

## 2022-11-19 NOTE — Consult Note (Addendum)
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  The patient stated had been compliant with dialysis as well as her fluid intake restrictions.  She has been staying the entire time for dialysis sessions.  Her last dialysis was on 11/16/2022.  She denied any fevers, chills, headache, nausea, vomiting, diarrhea.  She had been having some chest pain.  She has a nonproductive cough but denies any hemoptysis.  She has been having some melanotic stools without any hematochezia. The patient saw Dr. Halford Chessman on 10/22/2022 for dyspnea.  She was placed on trial of azithromycin and prednisone taper.  She states that has not helped a whole lot.   ED Course: In the ED she is afebrile, tachypneic, hypoxic requiring BiPAP placement.  Blood pressure  235T-732K systolic.  Chest x-ray  c/w pulmonary edema.  Blood work reveals LFT elevation with ALT 122 and AST 43, high-sensitivity troponin 62 >> 59, hemoglobin 8.5 and a white count of 15.5.  Nephrology was consulted, she was placed on a nitroglycerin infusion due to elevated blood pressure and we are asked to admit   Significant Hospital Events: Including procedures, antibiotic start and stop dates in addition to other pertinent  events   V/q lung scan 12/4 >>> No PE  Echo 12/4  Left ventricular ejection fraction, by estimation, is 70 to 75%. The  left ventricle has hyperdynamic function. The left ventricle has no  regional wall motion abnormalities. There is mild left ventricular  hypertrophy. Left ventricular diastolic  c/w Grade I diastolic dysfunction   Elevated left atrial pressure.   2. Right ventricular systolic function is normal. The right ventricular  size is normal. Tricuspid regurgitation signal is inadequate for assessing  PA pressure.   3. The mitral valve is normal in structure. No evidence of mitral valve  regurgitation. No evidence of mitral stenosis.   4. The tricuspid valve is abnormal.   5. The aortic valve is tricuspid. Aortic valve regurgitation is not  visualized. Mild aortic valve stenosis. Aortic valve mean gradient  measures 12.0 mmHg. Aortic valve peak gradient measures 27.1 mmHg. Aortic  valve area, by VTI measures 2.38 cm.   6. The inferior vena cava is dilated in size with >50% respiratory  variability, suggesting right atrial pressure of 8 mmHg.     Scheduled Meds:  aspirin EC  81 mg Oral Daily   azithromycin  250 mg Oral Q M,W,F   Chlorhexidine Gluconate Cloth  6 each Topical Q0600   cinacalcet  30 mg Oral Q breakfast   heparin  5,000 Units Subcutaneous Q8H   insulin aspart  0-5 Units Subcutaneous QHS   insulin aspart  0-9 Units Subcutaneous TID WC   labetalol  200 mg Oral  BID   linaclotide  290 mcg Oral QAC breakfast   methylPREDNISolone (SOLU-MEDROL) injection  40 mg Intravenous Q12H   mometasone-formoterol  2 puff Inhalation BID   NIFEdipine  60 mg Oral QPM   rOPINIRole  1 mg Oral BID   rosuvastatin  10 mg Oral Daily   Continuous Infusions:  nitroGLYCERIN 120 mcg/min (11/19/22 0945)   PRN Meds:.acetaminophen **OR** acetaminophen, albuterol, heparin, hydrALAZINE, ondansetron **OR** ondansetron (ZOFRAN) IV    Interim History / Subjective:  Better p UF  12/3  but  hbp remains refractory and on NTG drip, high dose procardia   Objective   Blood pressure (!) 176/67, pulse 81, temperature 98.4 F (36.9 C), temperature source Oral, resp. rate 20, height 5' 3" (1.6 m), weight 114.5 kg, SpO2 98 %.    FiO2 (%):  [40 %] 40 %   Intake/Output Summary (Last 24 hours) at 11/19/2022 1332 Last data filed at 11/19/2022 0800 Gross per 24 hour  Intake 222.06 ml  Output 1600 ml  Net -1377.94 ml   Filed Weights   11/18/22 1325 11/18/22 1923 11/19/22 0756  Weight: 111.6 kg 117.6 kg 114.5 kg    Examination: Tmax:  98.4 General appearance:    obese bf appears sleepy s increased wob at 30 degrees HOB   At Rest 02 sats  98% on 3lpm   No jvd Oropharynx clear,  mucosa nl Neck supple Lungs with a few scattered exp > insp rhonchi bilaterally RRR no s3 or or sign murmur Abd obese with limited  excursion  Extr warm with no edema or clubbing noted Neuro  Sensorium more alert per nursing ,  no apparent motor deficits    I personally reviewed images and agree with radiology impression as follows:  CXR:   pa and lateral 11/18/22 Moderate cardiomegaly with pulmonary vascular congestion. Diffuse bilateral interstitial and alveolar opacities. No large pleural fluid collection. No pneumothorax.     Assessment & Plan:  1)  Acute hypoxemic resp failure in setting of h/o severe HBP/ boop steroid dep?  >>> check ESR  and BNP  for baseline  >>> Rx 02 to keep sats > 95%  >>> continue to keep as dry as tolerated  Ddx: Miscellaneous:Alv microlithiasis, alv proteinosis, asp, bronchiectais, BOOP   ARDS/ AIP Occupational dz/ HSP Neoplasm Infection/ sepsis > ALI  - PCT up a bit but not that impressive Drug - none of the usual suspects listed  Pulmonary emboli, Protein disorders Edema Eosinophilic dz - Eos 0.2 on admit  Sarcoidosis Connective tissue dz Hist X / Hemorrhage Idiopathic   3) Severe hbp and hypoxemic resp failure  >>> d/c procardia/ try minoxidl   Best  Practice (right click and "Reselect all SmartList Selections" daily)  Per triad  Labs   CBC: Recent Labs  Lab 11/18/22 1433 11/19/22 0526  WBC 15.5* 15.4*  NEUTROABS 13.0*  --   HGB 8.5* 7.9*  HCT 27.3* 25.7*  MCV 91.3 90.5  PLT 232 163    Basic Metabolic Panel: Recent Labs  Lab 11/18/22 1433 11/19/22 0526  NA 135 138  K 4.4 3.4*  CL 99 101  CO2 19* 24  GLUCOSE 427* 109*  BUN 87* 68*  CREATININE 13.04* 11.14*  CALCIUM 8.7* 8.9  MG  --  1.8   GFR: Estimated Creatinine Clearance: 7.1 mL/min (A) (by C-G formula based on SCr of 11.14 mg/dL (H)). Recent Labs  Lab 11/18/22 1433 11/19/22 0526 11/19/22 0831  PROCALCITON  --   --  1.12  WBC 15.5* 15.4*  --     Liver Function Tests: Recent Labs  Lab 11/18/22 1433 11/19/22 0526  AST 43* 34  ALT 122* 105*  ALKPHOS 132* 108  BILITOT 0.7 0.7  PROT 7.1 6.6  ALBUMIN 4.0 3.8   No results for input(s): "LIPASE", "AMYLASE" in the last 168 hours. No results for input(s): "AMMONIA" in the last 168 hours.  ABG    Component Value Date/Time   PHART 7.513 (H) 02/24/2018 1810   PCO2ART 35.4 02/24/2018 1810   PO2ART 52.5 (L) 02/24/2018 1810   HCO3 19.3 (L) 01/11/2020 0011   TCO2 29 09/27/2016 1245   ACIDBASEDEF 5.7 (H) 01/11/2020 0011   O2SAT 73.0 01/11/2020 0011     Coagulation Profile: No results for input(s): "INR", "PROTIME" in the last 168 hours.  Cardiac Enzymes: No results for input(s): "CKTOTAL", "CKMB", "CKMBINDEX", "TROPONINI" in the last 168 hours.  HbA1C: Hgb A1c MFr Bld  Date/Time Value Ref Range Status  06/26/2021 11:59 AM 8.3 (H) 4.8 - 5.6 % Final    Comment:    (NOTE) Pre diabetes:          5.7%-6.4%  Diabetes:              >6.4%  Glycemic control for   <7.0% adults with diabetes   08/04/2020 04:52 AM 8.4 (H) 4.8 - 5.6 % Final    Comment:    (NOTE) Pre diabetes:          5.7%-6.4%  Diabetes:              >6.4%  Glycemic control for   <7.0% adults with diabetes     CBG: Recent  Labs  Lab 11/19/22 1201  GLUCAP 184*       Past Medical History:  She,  has a past medical history of Anemia, Ankle fracture, Arthritis, Blood transfusion without reported diagnosis, Breast cancer ([place]), Cancer ([place]), Diabetes mellitus without complication ([place]), Dialysis patient ([place]), End stage renal disease on dialysis ([place]), GERD (gastroesophageal reflux disease), Hypertension, OSA (obstructive sleep apnea), Pneumonia, PONV (postoperative nausea and vomiting), and Wears glasses.   Surgical History:   Past Surgical History:  Procedure Laterality Date   ABDOMINAL HYSTERECTOMY     AV FISTULA PLACEMENT  11/2014   at [place] [facility] N/A 04/2021   BALLOON DILATION N/A 07/10/2016   Procedure: BALLOON DILATION;  Surgeon: Danie Binder, MD;  Location: [facility];  Service: Endoscopy;  Laterality: N/A;  Pyloric dilation   BREAST LUMPECTOMY     CESAREAN SECTION     CHOLECYSTECTOMY     COLONOSCOPY WITH PROPOFOL N/A 09/27/2016   Dr. Gala Romney: Internal hemorrhoids repeat colonoscopy in 10 years   DILATION AND CURETTAGE OF UTERUS     ESOPHAGOGASTRODUODENOSCOPY N/A 07/10/2016   Dr.Fields- normal esophagus, gastric stenosis was found at the pylorus, gastritis on bx, normal examined duodenun   EXTERNAL FIXATION REMOVAL Right 10/29/2018   Procedure: REMOVAL RIGHT ANKLE BIOMET ZIMMER EXTERNAL FIXATOR, SHORT LEG CAST APPLICATION;  Surgeon: Marybelle Killings, MD;  Location: [facility];  Service: Orthopedics;  Laterality: Right;   MASTECTOMY     left sided   ORIF ANKLE FRACTURE Right 10/06/2018   Procedure: OPEN REDUCTION INTERNAL FIXATION (ORIF) RIGHT ANKLE TRIMALLEOLAR;  Surgeon: Marybelle Killings, MD;  Location: [place];  Service: Orthopedics;  Laterality: Right;     Social History:   reports that she has never smoked. She has never used smokeless tobacco. She reports that  she does not drink alcohol and does not use drugs.   Family History:  Her family history includes Diabetes Mellitus  II in her mother; Heart block in her mother; Hypertension in her mother, sister, and sister. There is no history of Colon cancer.   Allergies Allergies  Allergen Reactions   Amlodipine Besylate Rash and Other (See Comments)    dizziness   Reglan [Metoclopramide] Other (See Comments)    hallucinations      Home Medications  Prior to Admission medications   Medication Sig Start Date End Date Taking? Authorizing Provider  acetaminophen (TYLENOL) 325 MG tablet Take 650 mg by mouth every 6 (six) hours as needed.   Yes [provider]  albuterol (PROVENTIL) (2.5 MG/3ML) 0.083% nebulizer solution Take 3 mLs (2.5 mg total) by nebulization every 6 (six) hours as needed for wheezing or shortness of breath. 11/28/17  Yes Kathie Dike, MD  albuterol (VENTOLIN HFA) 108 (90 Base) MCG/ACT inhaler Inhale 2 puffs into the lungs every 6 (six) hours as needed for wheezing or shortness of breath. 06/28/22  Yes Chesley Mires, MD  aspirin EC 81 MG tablet Take 1 tablet (81 mg total) by mouth daily. Swallow whole. 12/07/21  Yes Fay Records, MD  azithromycin [facility]) 250 MG tablet 1 tab every Monday, Wednesday, and Friday 11/01/22  Yes Chesley Mires, MD  cetirizine (ZYRTEC) 10 MG tablet Take 10 mg by mouth daily. 03/24/22  Yes [provider]  Cholecalciferol (VITAMIN D3) 125 MCG (5000 UT) CAPS TAKE 1 CAPSULE BY MOUTH EVERY DAY 09/13/22  Yes Nida, Marella Chimes, MD  cinacalcet (SENSIPAR) 60 MG tablet Take 30 mg by mouth daily with breakfast.   Yes [provider]  dicyclomine (BENTYL) 10 MG capsule Take 1 capsule (10 mg total) by mouth 2 (two) times daily as needed. Patient taking differently: Take 10 mg by mouth 2 (two) times daily as needed for spasms. 03/20/22  Yes Annitta Needs, NP  docusate sodium (COLACE) 100 MG capsule Take 100 mg by mouth daily.   Yes [provider]  ferric citrate (AURYXIA) 1 GM 210 MG(Fe) tablet Take 2 tablets by mouth 2 (two) times daily with a  meal. With Breakfast & with supper   Yes [provider]  glipiZIDE (GLUCOTROL) 10 MG tablet Take 15 mg by mouth 2 (two) times daily. 03/27/22  Yes [provider]  ipratropium (ATROVENT) 0.03 % nasal spray Place 2 sprays into both nostrils 2 (two) times daily.  06/06/19  Yes [provider]  labetalol (NORMODYNE) 100 MG tablet Take 100 mg by mouth 2 (two) times daily.   Yes [provider]  linaclotide (LINZESS) 290 MCG CAPS capsule Take 290 mcg by mouth daily before breakfast.   Yes [provider]  montelukast (SINGULAIR) 10 MG tablet TAKE 1 TABLET BY MOUTH EVERYDAY AT BEDTIME 09/13/22  Yes Chesley Mires, MD  multivitamin (RENA-VIT) TABS tablet Take 1 tablet by mouth daily.   Yes [provider]  NIFEdipine (ADALAT CC) 30 MG 24 hr tablet Take 30 mg by mouth every evening. 11/01/22  Yes [provider]  pantoprazole (PROTONIX) 40 MG tablet Take 1 tablet (40 mg total) by mouth daily. 30 minutes before breakfast 03/20/22  Yes Annitta Needs, NP  predniSONE (DELTASONE) 10 MG tablet Take 3 tablets (30 mg total) by mouth daily with breakfast for 7 days, THEN 2 tablets (20 mg total) daily with breakfast for 7 days, THEN 1 tablet (10 mg total) daily with  breakfast for 28 days. 11/01/22 12/13/22 Yes Chesley Mires, MD  rOPINIRole (REQUIP XL) 2 MG 24 hr tablet Take 2 mg by mouth at bedtime.  09/10/16  Yes [provider]  rosuvastatin (CRESTOR) 10 MG tablet Take 1 tablet (10 mg total) by mouth daily. 01/08/22  Yes Fay Records, MD  sevelamer carbonate (RENVELA) 800 MG tablet Take 800 mg by mouth See admin instructions. Take 2 tablets (1600 mg) in the morning, take 3 tablets (2400 mg) with lunch or snack,  and take 2 tablets (1600 mg) by mouth in the evening with dinner meal.   Yes [provider]  sodium chloride (OCEAN) 0.65 % SOLN nasal spray Place 1 spray into both nostrils as needed for congestion. 01/02/20  Yes Hongalgi, Lenis Dickinson, MD   SYMBICORT 160-4.5 MCG/ACT inhaler Inhale 2 puffs into the lungs daily. 04/20/22  Yes [provider]  ondansetron (ZOFRAN-ODT) 4 MG disintegrating tablet Take 4 mg by mouth as needed for nausea or vomiting.     [provider]     The patient is critically ill with multiple organ systems failure and requires high complexity decision making for assessment and support, frequent evaluation and titration of therapies, application of advanced monitoring technologies and extensive interpretation of multiple databases. Critical Care Time devoted to patient care services described in this note is 40 minutes.   Christinia Gully, MD Pulmonary and [facility] [phone]   After 7:00 pm call Elink  [phone]

## 2022-11-19 NOTE — Consult Note (Addendum)
Gastroenterology Consult   Referring Provider: Dr. Carles Collet  Primary Care Physician:  Jake Samples, PA-C Primary Gastroenterologist:  Dr. Gala Romney   Patient ID: Patricia [name]; [id number]; 10/25/1971   Admit date: 11/18/2022  LOS: 0 days   Date of Consultation: 11/19/2022  Reason for Consultation:  Blood loss anemia  History of Present Illness   Patricia [name] is a 51 y.o. year old female with medical history significant of ESRD on MWF HD, DM 2, HTN, OSA, chronic hypoxic respiratory failure on 2 L at home, obliterative bronchiolitis, chronic constipation and GERD, presenting to the ED yesterday with sudden shortness of breath.   In ED: required BiPAP placement. Hypertensive. CXR with pulmonary edema. WBC count 15.5, Hgb 8.5, down from 13.2 in Mach 2023. Glucose 427, Cr 13.04, BUN 87. AST 43, ALT 122, Alk Phos 132. Troponin initially 62. [place] RUQ with hepatic steatosis, portal vein and hepatic vein appearing prominent but with normal directional flow. VQ scan today without acute PE.   Underwent emergent dialysis overnight. Dialysis planned for 12/5.   She notes starting on oral steroids 11/14; she noted darker stools about a week after. No abdominal pain. Appetite fair. Chokes on foods and ice, feels like gagging when trying to swallow at times. On pantoprazole daily. Last BM Saturday. Has been doing well on Linzess 290 mcg daily along with stool softener.  She remains on NTG infusion. BP 220/160 at time of consultation.    Last colonoscopy 2017: internal hemorrhoids EGD 2017: normal esophagus, pyloric stenosis s/p balloon dilation, gastritis. Negative H.pylori   Past Medical History:  Diagnosis Date   Anemia    Ankle fracture    Arthritis    Blood transfusion without reported diagnosis    Breast cancer ([place])    Cancer ([place])    Diabetes mellitus without complication ([place])    Dialysis patient ([place])    mon, wed, friday,    End stage renal disease on dialysis ([place])    M/W/F [facility] in  [place]   GERD (gastroesophageal reflux disease)    Hypertension    OSA (obstructive sleep apnea)    uses CPAP sometimes   Pneumonia    PONV (postoperative nausea and vomiting)    Wears glasses     Past Surgical History:  Procedure Laterality Date   ABDOMINAL HYSTERECTOMY     AV FISTULA PLACEMENT  11/2014   at [facility] N/A 04/2021   BALLOON DILATION N/A 07/10/2016   Procedure: BALLOON DILATION;  Surgeon: Danie Binder, MD;  [facility];  Service: Endoscopy;  Laterality: N/A;  Pyloric dilation   BREAST LUMPECTOMY     CESAREAN SECTION     CHOLECYSTECTOMY     COLONOSCOPY WITH PROPOFOL N/A 09/27/2016   Dr. Gala Romney: Internal hemorrhoids repeat colonoscopy in 10 years   DILATION AND CURETTAGE OF UTERUS     ESOPHAGOGASTRODUODENOSCOPY N/A 07/10/2016   Dr.Fields- normal esophagus, gastric stenosis was found at the pylorus, gastritis on bx, normal examined duodenun   EXTERNAL FIXATION REMOVAL Right 10/29/2018   Procedure: REMOVAL RIGHT ANKLE BIOMET ZIMMER EXTERNAL FIXATOR, SHORT LEG CAST APPLICATION;  Surgeon: Marybelle Killings, MD;  Location: [facility];  Service: Orthopedics;  Laterality: Right;   MASTECTOMY     left sided   ORIF ANKLE FRACTURE Right 10/06/2018   Procedure: OPEN REDUCTION INTERNAL FIXATION (ORIF) RIGHT ANKLE TRIMALLEOLAR;  Surgeon: Marybelle Killings, MD;  Location: [facility];  Service: Orthopedics;  Laterality: Right;  Prior to Admission medications   Medication Sig Start Date End Date Taking? Authorizing Provider  acetaminophen (TYLENOL) 325 MG tablet Take 650 mg by mouth every 6 (six) hours as needed.   Yes [provider]  albuterol (PROVENTIL) (2.5 MG/3ML) 0.083% nebulizer solution Take 3 mLs (2.5 mg total) by nebulization every 6 (six) hours as needed for wheezing or shortness of breath. 11/28/17  Yes Kathie Dike, MD  albuterol (VENTOLIN HFA) 108 (90 Base) MCG/ACT inhaler Inhale 2 puffs into the lungs every 6 (six) hours as  needed for wheezing or shortness of breath. 06/28/22  Yes Chesley Mires, MD  aspirin EC 81 MG tablet Take 1 tablet (81 mg total) by mouth daily. Swallow whole. 12/07/21  Yes Fay Records, MD  azithromycin [facility]) 250 MG tablet 1 tab every Monday, Wednesday, and Friday 11/01/22  Yes Chesley Mires, MD  cetirizine (ZYRTEC) 10 MG tablet Take 10 mg by mouth daily. 03/24/22  Yes [provider]  Cholecalciferol (VITAMIN D3) 125 MCG (5000 UT) CAPS TAKE 1 CAPSULE BY MOUTH EVERY DAY 09/13/22  Yes Nida, Marella Chimes, MD  cinacalcet (SENSIPAR) 60 MG tablet Take 30 mg by mouth daily with breakfast.   Yes [provider]  dicyclomine (BENTYL) 10 MG capsule Take 1 capsule (10 mg total) by mouth 2 (two) times daily as needed. Patient taking differently: Take 10 mg by mouth 2 (two) times daily as needed for spasms. 03/20/22  Yes Annitta Needs, NP  docusate sodium (COLACE) 100 MG capsule Take 100 mg by mouth daily.   Yes [provider]  ferric citrate (AURYXIA) 1 GM 210 MG(Fe) tablet Take 2 tablets by mouth 2 (two) times daily with a meal. With Breakfast & with supper   Yes [provider]  glipiZIDE (GLUCOTROL) 10 MG tablet Take 15 mg by mouth 2 (two) times daily. 03/27/22  Yes [provider]  ipratropium (ATROVENT) 0.03 % nasal spray Place 2 sprays into both nostrils 2 (two) times daily.  06/06/19  Yes [provider]  labetalol (NORMODYNE) 100 MG tablet Take 100 mg by mouth 2 (two) times daily.   Yes [provider]  linaclotide (LINZESS) 290 MCG CAPS capsule Take 290 mcg by mouth daily before breakfast.   Yes [provider]  montelukast (SINGULAIR) 10 MG tablet TAKE 1 TABLET BY MOUTH EVERYDAY AT BEDTIME 09/13/22  Yes Chesley Mires, MD  multivitamin (RENA-VIT) TABS tablet Take 1 tablet by mouth daily.   Yes [provider]  NIFEdipine (ADALAT CC) 30 MG 24 hr tablet Take 30 mg by mouth every evening. 11/01/22  Yes [provider]  pantoprazole (PROTONIX) 40 MG tablet Take 1 tablet (40 mg total) by mouth daily. 30 minutes before breakfast 03/20/22  Yes Annitta Needs, NP  predniSONE (DELTASONE) 10 MG tablet Take 3 tablets (30 mg total) by mouth daily with breakfast for 7 days, THEN 2 tablets (20 mg total) daily with breakfast for 7 days, THEN 1 tablet (10 mg total) daily with breakfast for 28 days. 11/01/22 12/13/22 Yes Chesley Mires, MD  rOPINIRole (REQUIP XL) 2 MG 24 hr tablet Take 2 mg by mouth at bedtime.  09/10/16  Yes [provider]  rosuvastatin (CRESTOR) 10 MG tablet Take 1 tablet (10 mg total) by mouth daily. 01/08/22  Yes Fay Records, MD  sevelamer carbonate (RENVELA) 800 MG tablet Take 800 mg by mouth See admin instructions. Take 2 tablets (1600 mg) in the morning, take 3 tablets (2400 mg)  with lunch or snack,  and take 2 tablets (1600 mg) by mouth in the evening with dinner meal.   Yes [provider]  sodium chloride (OCEAN) 0.65 % SOLN nasal spray Place 1 spray into both nostrils as needed for congestion. 01/02/20  Yes Hongalgi, Lenis Dickinson, MD  SYMBICORT 160-4.5 MCG/ACT inhaler Inhale 2 puffs into the lungs daily. 04/20/22  Yes [provider]  ondansetron (ZOFRAN-ODT) 4 MG disintegrating tablet Take 4 mg by mouth as needed for nausea or vomiting.     [provider]    Current Facility-Administered Medications  Medication Dose Route Frequency Provider Last Rate Last Admin   acetaminophen (TYLENOL) tablet 650 mg  650 mg Oral Q6H PRN Caren Griffins, MD   650 mg at 11/19/22 1610   Or   acetaminophen (TYLENOL) suppository 650 mg  650 mg Rectal Q6H PRN Caren Griffins, MD       albuterol (PROVENTIL) (2.5 MG/3ML) 0.083% nebulizer solution 2.5 mg  2.5 mg Nebulization Q6H PRN Caren Griffins, MD       aspirin EC tablet 81 mg  81 mg Oral Daily Caren Griffins, MD   81 mg at 11/19/22 0932   azithromycin (ZITHROMAX) tablet 250 mg  250 mg Oral Q M,W,F Caren Griffins, MD   250 mg  at 11/19/22 0931   Chlorhexidine Gluconate Cloth 2 % PADS 6 each  6 each Topical Q0600 Roney Jaffe, MD   6 each at 11/19/22 0754   cinacalcet (SENSIPAR) tablet 30 mg  30 mg Oral Q breakfast Caren Griffins, MD   30 mg at 11/19/22 1035   heparin injection 2,500 Units  2,500 Units Dialysis PRN Roney Jaffe, MD       heparin injection 5,000 Units  5,000 Units Subcutaneous Q8H Caren Griffins, MD   5,000 Units at 11/19/22 9604   hydrALAZINE (APRESOLINE) injection 10 mg  10 mg Intravenous Q6H PRN Tat, Shanon Brow, MD   10 mg at 11/19/22 1035   labetalol (NORMODYNE) tablet 200 mg  200 mg Oral BID Tat, David, MD       linaclotide Rolan Lipa) capsule 290 mcg  290 mcg Oral QAC breakfast Caren Griffins, MD   290 mcg at 11/19/22 0932   methylPREDNISolone sodium succinate (SOLU-MEDROL) 40 mg/mL injection 40 mg  40 mg Intravenous Therisa Doyne, MD   40 mg at 11/19/22 0931   mometasone-formoterol (DULERA) 200-5 MCG/ACT inhaler 2 puff  2 puff Inhalation BID Caren Griffins, MD   2 puff at 11/19/22 0829   NIFEdipine (PROCARDIA-XL/NIFEDICAL-XL) 24 hr tablet 60 mg  60 mg Oral QPM Tat, David, MD       nitroGLYCERIN 50 mg in dextrose 5 % 250 mL (0.2 mg/mL) infusion  50-200 mcg/min Intravenous Continuous Sherrell Puller, PA-C 36 mL/hr at 11/19/22 0945 120 mcg/min at 11/19/22 0945   ondansetron (ZOFRAN) tablet 4 mg  4 mg Oral Q6H PRN Caren Griffins, MD       Or   ondansetron (ZOFRAN) injection 4 mg  4 mg Intravenous Q6H PRN Caren Griffins, MD   4 mg at 11/19/22 1035   rOPINIRole (REQUIP) tablet 1 mg  1 mg Oral BID Tat, David, MD       rosuvastatin (CRESTOR) tablet 10 mg  10 mg Oral Daily Caren Griffins, MD   10 mg at 11/19/22 0932    Allergies as of 11/18/2022 - Review Complete 11/18/2022  Allergen Reaction Noted   Amlodipine besylate Rash  and Other (See Comments) 12/04/2015   Reglan [metoclopramide] Other (See Comments) 12/04/2015    Family History  Problem Relation Age of Onset   Diabetes  Mellitus II Mother    Hypertension Mother    Heart block Mother    Hypertension Sister    Hypertension Sister    Colon cancer Neg Hx     Social History   Socioeconomic History   Marital status: Widowed    Spouse name: Not on file   Number of children: Not on file   Years of education: Not on file   Highest education level: Not on file  Occupational History   Not on file  Tobacco Use   Smoking status: Never   Smokeless tobacco: Never  Vaping Use   Vaping Use: Never used  Substance and Sexual Activity   Alcohol use: No    Alcohol/week: 0.0 standard drinks of alcohol   Drug use: No   Sexual activity: Not Currently  Other Topics Concern   Not on file  Social History Narrative   Not on file   Social Determinants of Health   Financial Resource Strain: Not on file  Food Insecurity: No Food Insecurity (11/19/2022)   Hunger Vital Sign    Worried About Running Out of Food in the Last Year: Never true    Ran Out of Food in the Last Year: Never true  Transportation Needs: No Transportation Needs (11/19/2022)   PRAPARE - Hydrologist (Medical): No    Lack of Transportation (Non-Medical): No  Physical Activity: Not on file  Stress: Not on file  Social Connections: Not on file  Intimate Partner Violence: Not At Risk (11/19/2022)   Humiliation, Afraid, Rape, and Kick questionnaire    Fear of Current or Ex-Partner: No    Emotionally Abused: No    Physically Abused: No    Sexually Abused: No     Review of Systems   Gen: Denies any fever, chills, loss of appetite, change in weight or weight loss CV: see HPI Resp: see HPI GI: see HPI GU : Denies urinary burning, blood in urine, urinary frequency, and urinary incontinence. MS: Denies joint pain, limitation of movement, swelling, cramps, and atrophy.  Derm: Denies rash, itching, dry skin, hives. Psych: Denies depression, anxiety, memory loss, hallucinations, and confusion. Heme: Denies bruising or  bleeding Neuro:  Denies any headaches, dizziness, paresthesias, shaking  Physical Exam   Vital Signs in last 24 hours: Temp:  [97.8 F (36.6 C)-98.3 F (36.8 C)] 97.8 F (36.6 C) (12/04 0756) Pulse Rate:  [72-88] 82 (12/04 1000) Resp:  [13-31] 18 (12/04 1000) BP: (148-207)/(43-119) 199/80 (12/04 1000) SpO2:  [88 %-100 %] 94 % (12/04 1000) FiO2 (%):  [40 %] 40 % (12/03 1648) Weight:  [111.6 kg-117.6 kg] 114.5 kg (12/04 0756)    General:   Alert,  acutely ill, resting in bed but awakens to verbal stimuli.  Head:  Normocephalic and atraumatic. Eyes:  Sclera clear, no icterus.    Ears:  Normal auditory acuity. Lungs:  scattered rhonchi Heart:  S1 S2 present Abdomen:  Obese, round with large AP diameter, no TTP, distended but soft Rectal: deferred   Msk:  Symmetrical without gross deformities. Normal posture. Extremities:  Without edema Neurologic:  Alert and  oriented x4. Skin:  Intact without significant lesions or rashes. Psych:  Alert and cooperative. Normal mood and affect.  Intake/Output from previous day: 12/03 0701 - 12/04 0700 In: -  Out: 1600  Intake/Output this shift: Total I/O In: 222.1 [I.V.:222.1] Out: -     Labs/Studies   Recent Labs Recent Labs    11/18/22 1433 11/19/22 0526  WBC 15.5* 15.4*  HGB 8.5* 7.9*  HCT 27.3* 25.7*  PLT 232 253   BMET Recent Labs    11/18/22 1433 11/19/22 0526  NA 135 138  K 4.4 3.4*  CL 99 101  CO2 19* 24  GLUCOSE 427* 109*  BUN 87* 68*  CREATININE 13.04* 11.14*  CALCIUM 8.7* 8.9   LFT Recent Labs    11/18/22 1433 11/19/22 0526  PROT 7.1 6.6  ALBUMIN 4.0 3.8  AST 43* 34  ALT 122* 105*  ALKPHOS 132* 108  BILITOT 0.7 0.7    Hepatitis Panel Recent Labs    11/18/22 1720  HEPBSAG NON REACTIVE   Lab Results  Component Value Date   IRON 34 11/19/2022   TIBC 236 (L) 11/19/2022   FERRITIN 950 (H) 11/19/2022     Radiology/Studies US Abdomen Limited RUQ (LIVER/GB)  Result Date:  11/19/2022 CLINICAL DATA:  Elevated LFTs EXAM: ULTRASOUND ABDOMEN LIMITED RIGHT UPPER QUADRANT COMPARISON:  No prior abdomen ultrasound available, correlation is made with CT abdomen pelvis 07/01/2021 FINDINGS: Gallbladder: Status post cholecystectomy. Common bile duct: Diameter: 5 mm, within normal limits. No intrahepatic biliary ductal dilatation. Liver: No focal lesion identified. Increased parenchymal echogenicity. Portal vein is patent on color Doppler imaging with normal direction of blood flow towards the liver, although the portal vein and hepatic vein appears somewhat prominent. Other: None. IMPRESSION: 1. Hepatic steatosis. 2. Portal vein and hepatic vein appear somewhat prominent, although there is normal direction of blood flow toward the liver in the portal vein. Electronically Signed   By: Merilyn Baba M.D.   On: 11/19/2022 11:14   DG Chest 2 View  Result Date: 11/18/2022 CLINICAL DATA:  Shortness of breath EXAM: CHEST - 2 VIEW COMPARISON:  10/29/2022 FINDINGS: Moderate cardiomegaly with pulmonary vascular congestion. Diffuse bilateral interstitial and alveolar opacities. No large pleural fluid collection. No pneumothorax. IMPRESSION: Appearance suggests CHF with pulmonary edema. Electronically Signed   By: Davina Poke D.O.   On: 11/18/2022 13:48     Assessment   Patricia [name] is a 51 y.o. year old female 51 y.o. year old female with medical history significant of ESRD on MWF HD, DM 2, HTN, OSA, chronic hypoxic respiratory failure on 2 L at home, obliterative bronchiolitis, chronic constipation and GERD, presenting to the ED yesterday with sudden shortness of breath. GI consulted due to anemia.   Normocytic anemia: Hgb 8.5 on admission, down from 13.2 in March 2023. This morning 7.9. Unknown true baseline but does appear to have been in the 9/10 range last year. Unknown hemoccult. Patient reporting darker stools but no BM since admission. No obvious overt GI bleeding currently. Could  certainly have UGI source in setting of recent steroids. Recommend EGD prior to discharge; however, she is not a candidate for anesthesia at this time until optimized from  cardiopulmonary standpoint.   Elevated Transaminases: AST 43, ALT 122 on admission, improve to AST 34 and ALT 105 today. Known hepatic steatosis with [place] updated this morning. Suspect multifactorial in setting of acute illness and likely has hepatic congestion underlying.     Plan / Recommendations    May have renal diet NPO after midnight Will reassess for candidacy for EGD tomorrow. Needs to be optimized from cardiopulmonary standpoint and with controlled BP PPI daily Continue LInzess 290 mcg daily Hemoccult stools if  able     11/19/2022, 11:25 AM  Annitta Needs, PhD, ANP-BC [facility]

## 2022-11-19 NOTE — ED Notes (Signed)
MD paged about pt status decline.

## 2022-11-20 DIAGNOSIS — D649 Anemia, unspecified: Secondary | ICD-10-CM | POA: Diagnosis not present

## 2022-11-20 DIAGNOSIS — J9601 Acute respiratory failure with hypoxia: Secondary | ICD-10-CM | POA: Diagnosis not present

## 2022-11-20 DIAGNOSIS — N186 End stage renal disease: Secondary | ICD-10-CM | POA: Diagnosis not present

## 2022-11-20 DIAGNOSIS — J810 Acute pulmonary edema: Secondary | ICD-10-CM | POA: Diagnosis not present

## 2022-11-20 LAB — GLUCOSE, CAPILLARY
Glucose-Capillary: 404 mg/dL — ABNORMAL HIGH (ref 70–99)
Glucose-Capillary: 460 mg/dL — ABNORMAL HIGH (ref 70–99)
Glucose-Capillary: 190 mg/dL — ABNORMAL HIGH (ref 70–99)
Glucose-Capillary: 173 mg/dL — ABNORMAL HIGH (ref 70–99)
Glucose-Capillary: 291 mg/dL — ABNORMAL HIGH (ref 70–99)
Glucose-Capillary: 242 mg/dL — ABNORMAL HIGH (ref 70–99)

## 2022-11-20 LAB — HEPATITIS B SURFACE ANTIBODY, QUANTITATIVE: Hep B S AB Quant (Post): 52.9 m[IU]/mL (ref >9.9)

## 2022-11-20 LAB — HEMOGLOBIN A1C
Hgb A1c MFr Bld: 9.5 % — ABNORMAL HIGH (ref 4.8–5.6)
Mean Plasma Glucose: 226 mg/dL

## 2022-11-20 MED ORDER — INSULIN ASPART 100 UNIT/ML IJ SOLN
4.0000 [IU] | Freq: Three times a day (TID) | INTRAMUSCULAR | Status: DC
  Administered 2022-11-20 – 2022-11-23 (×9): 4 [IU] via SUBCUTANEOUS

## 2022-11-20 MED ORDER — DICYCLOMINE HCL 10 MG PO CAPS
10.0000 mg | ORAL_CAPSULE | Freq: Two times a day (BID) | ORAL | Status: DC | PRN
  Administered 2022-11-20 – 2022-11-23 (×3): 10 mg via ORAL
  Filled 2022-11-20 (×3): qty 1

## 2022-11-20 MED ORDER — DARBEPOETIN ALFA 60 MCG/0.3ML IJ SOSY
60.0000 ug | PREFILLED_SYRINGE | Freq: Once | INTRAMUSCULAR | Status: AC
  Administered 2022-11-20: 60 ug via SUBCUTANEOUS
  Filled 2022-11-20: qty 0.3

## 2022-11-20 MED ORDER — SODIUM CHLORIDE 0.9 % IV SOLN
125.0000 mg | Freq: Every day | INTRAVENOUS | Status: DC
  Administered 2022-11-20 – 2022-11-23 (×4): 125 mg via INTRAVENOUS
  Filled 2022-11-20 (×5): qty 10

## 2022-11-20 MED ORDER — INSULIN ASPART 100 UNIT/ML IJ SOLN
15.0000 [IU] | Freq: Once | INTRAMUSCULAR | Status: AC
  Administered 2022-11-20: 15 [IU] via SUBCUTANEOUS

## 2022-11-20 MED ORDER — PANTOPRAZOLE SODIUM 40 MG IV SOLR
40.0000 mg | Freq: Two times a day (BID) | INTRAVENOUS | Status: DC
  Administered 2022-11-20 – 2022-11-23 (×7): 40 mg via INTRAVENOUS
  Filled 2022-11-20 (×7): qty 10

## 2022-11-20 MED ORDER — SODIUM CHLORIDE 0.9 % IV SOLN
12.5000 mg | Freq: Once | INTRAVENOUS | Status: AC
  Administered 2022-11-20: 12.5 mg via INTRAVENOUS
  Filled 2022-11-20: qty 0.5

## 2022-11-20 MED ORDER — INSULIN ASPART 100 UNIT/ML IJ SOLN
0.0000 [IU] | Freq: Three times a day (TID) | INTRAMUSCULAR | Status: DC
  Administered 2022-11-20: 3 [IU] via SUBCUTANEOUS
  Administered 2022-11-20 – 2022-11-21 (×2): 8 [IU] via SUBCUTANEOUS
  Administered 2022-11-21: 3 [IU] via SUBCUTANEOUS
  Administered 2022-11-21: 5 [IU] via SUBCUTANEOUS
  Administered 2022-11-22 (×2): 8 [IU] via SUBCUTANEOUS
  Administered 2022-11-22: 15 [IU] via SUBCUTANEOUS
  Administered 2022-11-23: 8 [IU] via SUBCUTANEOUS
  Administered 2022-11-23: 11 [IU] via SUBCUTANEOUS
  Administered 2022-11-23: 8 [IU] via SUBCUTANEOUS

## 2022-11-20 MED ORDER — INSULIN ASPART 100 UNIT/ML IJ SOLN
0.0000 [IU] | Freq: Every day | INTRAMUSCULAR | Status: DC
  Administered 2022-11-20: 2 [IU] via SUBCUTANEOUS
  Administered 2022-11-21: 4 [IU] via SUBCUTANEOUS

## 2022-11-20 NOTE — Progress Notes (Addendum)
Gastroenterology Progress Note    Primary Care Physician:  Scherrie Bateman Primary Gastroenterologist:  Dr. Gala Romney   Patient ID: Patricia [name]; [id number]; 1971-12-01    Subjective   No abdominal pain, N/V. No overt GI bleeding. Feels breathing is not optimized. Feels short of breath. NTG infusion stopped around 0845.    Objective   Vital signs in last 24 hours Temp:  [98.2 F (36.8 C)-99 F (37.2 C)] 98.2 F (36.8 C) (12/05 0700) Pulse Rate:  [29-94] 77 (12/05 1130) Resp:  [15-33] 27 (12/05 1130) BP: (99-197)/(31-87) 99/34 (12/05 1130) SpO2:  [93 %-100 %] 97 % (12/05 1130) Weight:  [114.4 kg] 114.4 kg (12/05 0844) Last BM Date : 11/19/22  Physical Exam General:   Alert and oriented, pleasant, chronically ill-appearing. O 2 nasal cannula Head:  Normocephalic and atraumatic. Abdomen:  Bowel sounds present, obese, soft, non-tender, non-distended. No HSM or hernias noted. No rebound or guarding. No masses appreciated  Neurologic:  Alert and  oriented x4   Intake/Output from previous day: 12/04 0701 - 12/05 0700 In: 762.4 [P.O.:240; I.V.:522.4] Out: -  Intake/Output this shift: Total I/O In: 120 [P.O.:120] Out: -   Lab Results  Recent Labs    11/18/22 1433 11/19/22 0526  WBC 15.5* 15.4*  HGB 8.5* 7.9*  HCT 27.3* 25.7*  PLT 232 253   BMET Recent Labs    11/18/22 1433 11/19/22 0526  NA 135 138  K 4.4 3.4*  CL 99 101  CO2 19* 24  GLUCOSE 427* 109*  BUN 87* 68*  CREATININE 13.04* 11.14*  CALCIUM 8.7* 8.9   LFT Recent Labs    11/18/22 1433 11/19/22 0526  PROT 7.1 6.6  ALBUMIN 4.0 3.8  AST 43* 34  ALT 122* 105*  ALKPHOS 132* 108  BILITOT 0.7 0.7   Hepatitis Panel Recent Labs    11/18/22 1720  HEPBSAG NON REACTIVE   Studies/Results NM Pulmonary Perfusion  Result Date: 11/19/2022 CLINICAL DATA:  Chest pain for 2 days, nonspecific. Shortness of breath for 2 weeks EXAM: NUCLEAR MEDICINE PERFUSION LUNG SCAN TECHNIQUE: Perfusion  images were obtained in multiple projections after intravenous injection of radiopharmaceutical. Ventilation scans intentionally deferred if perfusion scan and chest x-ray adequate for interpretation during COVID 19 epidemic. RADIOPHARMACEUTICALS:  4.17 mCi Tc-50m MAA IV COMPARISON:  Chest radiograph 11/18/2022 FINDINGS: There is a uniform distribution of the radiopharmaceutical within both lungs. No peripheral segmental perfusion defects identified bilaterally to suggest an acute pulmonary embolus. IMPRESSION: No evidence for acute pulmonary embolus. Electronically Signed   By: Kerby Moors M.D.   On: 11/19/2022 13:39   ECHOCARDIOGRAM COMPLETE  Result Date: 11/19/2022    ECHOCARDIOGRAM REPORT   Patient Name:   Patricia [name] Date of Exam: 11/19/2022 Medical Rec #:  [id number]   Height:       63.0 in Accession #:    [id number]  Weight:       259.3 lb Date of Birth:  [birth year]  BSA:          2.159 m Patient Age:    51 years    BP:           169/46 mmHg Patient Gender: F           HR:           78 bpm. Exam Location:  [facility] Procedure: 2D Echo, Cardiac Doppler and Color Doppler Indications:    Chest Pain  History:        Patient has  prior history of Echocardiogram examinations, most                 recent 06/27/2021. CHF, Signs/Symptoms:Chest Pain, Shortness of                 Breath and Dyspnea; Risk Factors:Hypertension and Diabetes.                 Breast CA.  Sonographer:    Wenda Low Referring Phys: [phone] DAVID TAT  Sonographer Comments: Patient is obese. IMPRESSIONS  1. Left ventricular ejection fraction, by estimation, is 70 to 75%. The left ventricle has hyperdynamic function. The left ventricle has no regional wall motion abnormalities. There is mild left ventricular hypertrophy. Left ventricular diastolic parameters are consistent with Grade I diastolic dysfunction (impaired relaxation). Elevated left atrial pressure.  2. Right ventricular systolic function is normal. The right ventricular size  is normal. Tricuspid regurgitation signal is inadequate for assessing PA pressure.  3. The mitral valve is normal in structure. No evidence of mitral valve regurgitation. No evidence of mitral stenosis.  4. The tricuspid valve is abnormal.  5. The aortic valve is tricuspid. Aortic valve regurgitation is not visualized. Mild aortic valve stenosis. Aortic valve mean gradient measures 12.0 mmHg. Aortic valve peak gradient measures 27.1 mmHg. Aortic valve area, by VTI measures 2.38 cm.  6. The inferior vena cava is dilated in size with >50% respiratory variability, suggesting right atrial pressure of 8 mmHg. FINDINGS  Left Ventricle: Left ventricular ejection fraction, by estimation, is 70 to 75%. The left ventricle has hyperdynamic function. The left ventricle has no regional wall motion abnormalities. The left ventricular internal cavity size was normal in size. There is mild left ventricular hypertrophy. Left ventricular diastolic parameters are consistent with Grade I diastolic dysfunction (impaired relaxation). Elevated left atrial pressure. Right Ventricle: The right ventricular size is normal. Right vetricular wall thickness was not well visualized. Right ventricular systolic function is normal. Tricuspid regurgitation signal is inadequate for assessing PA pressure. Left Atrium: Left atrial size was normal in size. Right Atrium: Right atrial size was normal in size. Pericardium: There is no evidence of pericardial effusion. Mitral Valve: The mitral valve is normal in structure. There is mild thickening of the mitral valve leaflet(s). There is mild calcification of the mitral valve leaflet(s). Mild mitral annular calcification. No evidence of mitral valve regurgitation. No evidence of mitral valve stenosis. MV peak gradient, 9.5 mmHg. The mean mitral valve gradient is 4.0 mmHg. Tricuspid Valve: The tricuspid valve is abnormal. Tricuspid valve regurgitation is mild . No evidence of tricuspid stenosis. Aortic  Valve: The aortic valve is tricuspid. Aortic valve regurgitation is not visualized. Mild aortic stenosis is present. Aortic valve mean gradient measures 12.0 mmHg. Aortic valve peak gradient measures 27.1 mmHg. Aortic valve area, by VTI measures 2.38 cm. Pulmonic Valve: The pulmonic valve was not well visualized. Pulmonic valve regurgitation is not visualized. No evidence of pulmonic stenosis. Aorta: The aortic root is normal in size and structure. Venous: The inferior vena cava is dilated in size with greater than 50% respiratory variability, suggesting right atrial pressure of 8 mmHg. IAS/Shunts: No atrial level shunt detected by color flow Doppler.  LEFT VENTRICLE PLAX 2D LVIDd:         4.40 cm   Diastology LVIDs:         2.50 cm   LV e' medial:    6.96 cm/s LV PW:         1.10 cm  LV E/e' medial:  16.5 LV IVS:        1.10 cm   LV e' lateral:   6.74 cm/s LVOT diam:     2.00 cm   LV E/e' lateral: 17.1 LV SV:         116 LV SV Index:   54 LVOT Area:     3.14 cm  RIGHT VENTRICLE RV Basal diam:  4.05 cm RV Mid diam:    3.30 cm RV S prime:     16.80 cm/s TAPSE (M-mode): 3.1 cm LEFT ATRIUM              Index        RIGHT ATRIUM           Index LA diam:        4.70 cm  2.18 cm/m   RA Area:     23.50 cm LA Vol (A2C):   103.0 ml 47.70 ml/m  RA Volume:   72.80 ml  33.72 ml/m LA Vol (A4C):   71.4 ml  33.07 ml/m LA Biplane Vol: 87.1 ml  40.34 ml/m  AORTIC VALVE AV Area (Vmax):    1.95 cm AV Area (Vmean):   1.88 cm AV Area (VTI):     2.38 cm AV Vmax:           260.50 cm/s AV Vmean:          158.500 cm/s AV VTI:            0.489 m AV Peak Grad:      27.1 mmHg AV Mean Grad:      12.0 mmHg LVOT Vmax:         162.00 cm/s LVOT Vmean:        95.100 cm/s LVOT VTI:          0.370 m LVOT/AV VTI ratio: 0.76  AORTA Ao Root diam: 3.00 cm MITRAL VALVE MV Area (PHT): 2.81 cm     SHUNTS MV Area VTI:   2.79 cm     Systemic VTI:  0.37 m MV Peak grad:  9.5 mmHg     Systemic Diam: 2.00 cm MV Mean grad:  4.0 mmHg MV Vmax:        1.54 m/s MV Vmean:      97.9 cm/s MV Decel Time: 270 msec MV E velocity: 115.00 cm/s MV A velocity: 142.00 cm/s MV E/A ratio:  0.81 Carlyle Dolly MD Electronically signed by Carlyle Dolly MD Signature Date/Time: 11/19/2022/12:03:01 PM    Final    US Abdomen Limited RUQ (LIVER/GB)  Result Date: 11/19/2022 CLINICAL DATA:  Elevated LFTs EXAM: ULTRASOUND ABDOMEN LIMITED RIGHT UPPER QUADRANT COMPARISON:  No prior abdomen ultrasound available, correlation is made with CT abdomen pelvis 07/01/2021 FINDINGS: Gallbladder: Status post cholecystectomy. Common bile duct: Diameter: 5 mm, within normal limits. No intrahepatic biliary ductal dilatation. Liver: No focal lesion identified. Increased parenchymal echogenicity. Portal vein is patent on color Doppler imaging with normal direction of blood flow towards the liver, although the portal vein and hepatic vein appears somewhat prominent. Other: None. IMPRESSION: 1. Hepatic steatosis. 2. Portal vein and hepatic vein appear somewhat prominent, although there is normal direction of blood flow toward the liver in the portal vein. Electronically Signed   By: Merilyn Baba M.D.   On: 11/19/2022 11:14   DG Chest 2 View  Result Date: 11/18/2022 CLINICAL DATA:  Shortness of breath EXAM: CHEST - 2 VIEW COMPARISON:  10/29/2022 FINDINGS: Moderate cardiomegaly with pulmonary vascular congestion.  Diffuse bilateral interstitial and alveolar opacities. No large pleural fluid collection. No pneumothorax. IMPRESSION: Appearance suggests CHF with pulmonary edema. Electronically Signed   By: Davina Poke D.O.   On: 11/18/2022 13:48    Assessment  51 y.o. female with a history of ESRD on MWF HD, DM 2, HTN, OSA, chronic hypoxic respiratory failure on 2 L at home, obliterative bronchiolitis, chronic constipation and GERD, presenting to the ED yesterday with sudden shortness of breath. GI consulted due to anemia.   Normocytic anemia: Hgb 8.5 on admission, down from 13.2 in March  2023. Yesterday 7.9. Unknown true baseline but does appear to have been in the 9/10 range last year. Unknown hemoccult. Patient reporting darker stools but no BM since admission. EGD 2017: normal esophagus, pyloric stenosis s/p balloon dilation, gastritis. Negative H.pylori . Last colonoscopy 2017 with hemorrhoids. [name] EGD prior to discharge but first [name] to be optimized from cardiopulmonary standpoint. NTG infusion stopped as of 0845 this morning.   Elevated transaminases: AST 43, ALT 122 on admission. Known hepatic steatosis. Improved yesterday. Suspect multifactorial in setting of acute illness, hepatic congestion, etc. Can follow clinically.     Plan / Recommendations  Renal diet NPO after midnight Can reassess candidacy tomorrow PPI BID LInzess 290 mcg daily CBC, CMP in am No heparin in am in preparation for possible endoscopy    LOS: 1 day    11/20/2022, 11:47 AM  Patricia Needs, PhD, ANP-BC [name]

## 2022-11-20 NOTE — Plan of Care (Signed)

## 2022-11-20 NOTE — Progress Notes (Signed)
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  The patient stated had been compliant with dialysis as well as her fluid intake restrictions.  She has been staying the entire time for dialysis sessions.  Her last dialysis was on 11/16/2022.  She denied any fevers, chills, headache, nausea, vomiting, diarrhea.  She had been having some chest pain.  She has a nonproductive cough but denies any hemoptysis.  She has been having some melanotic stools without any hematochezia. The patient saw Dr. Halford Chessman on 10/22/2022 for dyspnea.  She was placed on trial of azithromycin and prednisone taper.  She states that has not helped a whole lot.   ED Course: In the ED she is afebrile, tachypneic, hypoxic requiring BiPAP placement.  Blood pressure  270W-237S systolic.  Chest x-ray  c/w pulmonary edema.  Blood work reveals LFT elevation with ALT 122 and AST 43, high-sensitivity troponin 62 >> 59, hemoglobin 8.5 and a white count of 15.5.  Nephrology was consulted, she was placed on a nitroglycerin infusion due to elevated blood pressure and we are asked to admit   Significant Hospital Events: Including procedures, antibiotic start and stop dates in addition to other pertinent  events   V/q lung scan 12/4 >>> No PE  Echo 12/4  Left ventricular ejection fraction, by estimation, is 70 to 75%. The  left ventricle has hyperdynamic function. The left ventricle has no  regional wall motion abnormalities. There is mild left ventricular  hypertrophy. Left ventricular diastolic  c/w Grade I diastolic dysfunction   Elevated left atrial pressure.   2. Right ventricular systolic function is normal. The right ventricular  size is normal. Tricuspid regurgitation signal is inadequate for assessing  PA pressure.   3. The mitral valve is normal in structure. No evidence of mitral valve  regurgitation. No evidence of mitral stenosis.   4. The tricuspid valve is abnormal.   5. The aortic valve is tricuspid. Aortic valve regurgitation is not  visualized. Mild aortic valve stenosis. Aortic valve mean gradient  measures 12.0 mmHg. Aortic valve peak gradient measures 27.1 mmHg. Aortic  valve area, by VTI measures 2.38 cm.   6. The inferior vena cava is dilated in size with >50% respiratory  variability, suggesting right atrial pressure of 8 mmHg.  - Perfusion scan 5/4 very low prob PE    Scheduled Meds:  aspirin EC  81 mg Oral Daily   azithromycin  250 mg Oral Q M,W,F   Chlorhexidine Gluconate Cloth  6 each Topical Q0600   cinacalcet  30 mg Oral Q breakfast   heparin  5,000 Units Subcutaneous Q8H   insulin aspart  0-15 Units Subcutaneous TID WC   insulin aspart  0-5 Units Subcutaneous  QHS   insulin aspart  4 Units Subcutaneous TID WC   labetalol  200 mg Oral BID   linaclotide  290 mcg Oral QAC breakfast   methylPREDNISolone (SOLU-MEDROL) injection  40 mg Intravenous Q12H   minoxidil  5 mg Oral BID   mometasone-formoterol  2 puff Inhalation BID   pantoprazole (PROTONIX) IV  40 mg Intravenous Q12H   rOPINIRole  1 mg Oral BID   rosuvastatin  10 mg Oral Daily   Continuous Infusions:  ferric gluconate (FERRLECIT) IVPB 125 mg (11/20/22 1130)   nitroGLYCERIN Stopped (11/20/22  0845)   PRN Meds:.acetaminophen **OR** acetaminophen, albuterol, dicyclomine, heparin, hydrALAZINE, ondansetron **OR** ondansetron (ZOFRAN) IV, sodium chloride    Interim History / Subjective:  Better overnight, doesn't like bipap " Dr Halford Chessman said I could just use 02)   Objective   Blood pressure (!) 100/42, pulse 76, temperature 98 F (36.7 C), temperature source Oral, resp. rate (!) 23, height _0  (1.6 m), weight 112.3 kg, SpO2 100 %.        Intake/Output Summary (Last 24 hours) at 11/20/2022 1640 Last data filed at 11/20/2022 1229 Gross per 24 hour  Intake 120 ml  Output 2 ml  Net 118 ml   Filed Weights   11/19/22 0756 11/20/22 0844 11/20/22 1302  Weight: 114.5 kg 114.4 kg 112.3 kg    Examination: Tmax:  99 General appearance:    more chronically tahn acutely ill   At Rest 02 sats  95% on 4lpm   No jvd Oropharynx clear,  mucosa nl Neck supple Lungs with a few scattered exp > insp rhonchi bilaterally RRR no s3 or or sign murmur Abd obese with limited excursion  Extr warm with no edema or clubbing noted Neuro  Sensorium intact ,  no apparent motor deficits      Assessment & Plan:  1)  Acute hypoxemic resp failure in setting of h/o severe HBP/ boop steroid dep?  - ESR 15 12/4 with BNP 152  >>> Rx 02 to keep sats > 95%  >>> continue to keep as dry as tolerated  Ddx: Miscellaneous:Alv microlithiasis, alv proteinosis, asp, bronchiectais, BOOP   ARDS/ AIP Occupational dz/ HSP Neoplasm Infection/ sepsis > ALI  - PCT up a bit but not that impressive Drug - none of the usual suspects listed  Pulmonary emboli, Protein disorders Edema Eosinophilic dz - Eos 0.2 on admit  Sarcoidosis Connective tissue dz Hist X / Hemorrhage Idiopathic   3) Severe hbp and hypoxemic resp failure  >>> d/c 'd procardia/ started  minoxidl  12/4 and   Best Practice (right click and "Reselect all SmartList Selections" daily)  Per triad  Labs   CBC: Recent Labs  Lab 11/18/22 1433  11/19/22 0526  WBC 15.5* 15.4*  NEUTROABS 13.0*  --   HGB 8.5* 7.9*  HCT 27.3* 25.7*  MCV 91.3 90.5  PLT 232 154    Basic Metabolic Panel: Recent Labs  Lab 11/18/22 1433 11/19/22 0526  NA 135 138  K 4.4 3.4*  CL 99 101  CO2 19* 24  GLUCOSE 427* 109*  BUN 87* 68*  CREATININE 13.04* 11.14*  CALCIUM 8.7* 8.9  MG  --  1.8   GFR: Estimated Creatinine Clearance: 7 mL/min (A) (by C-G formula based on SCr of 11.14 mg/dL (H)). Recent Labs  Lab 11/18/22 1433 11/19/22 0526 11/19/22 0831  PROCALCITON  --   --  1.12  WBC 15.5* 15.4*  --     Liver Function Tests: Recent  Labs  Lab 11/18/22 1433 11/19/22 0526  AST 43* 34  ALT 122* 105*  ALKPHOS 132* 108  BILITOT 0.7 0.7  PROT 7.1 6.6  ALBUMIN 4.0 3.8   No results for input(s): "LIPASE", "AMYLASE" in the last 168 hours. No results for input(s): "AMMONIA" in the last 168 hours.  ABG    Component Value Date/Time   PHART 7.513 (H) 02/24/2018 1810   PCO2ART 35.4 02/24/2018 1810   PO2ART 52.5 (L) 02/24/2018 1810   HCO3 19.3 (L) 01/11/2020 0011   TCO2 29 09/27/2016 1245   ACIDBASEDEF 5.7 (H) 01/11/2020 0011   O2SAT 73.0 01/11/2020 0011     Coagulation Profile: No results for input(s): "INR", "PROTIME" in the last 168 hours.  Cardiac Enzymes: No results for input(s): "CKTOTAL", "CKMB", "CKMBINDEX", "TROPONINI" in the last 168 hours.  HbA1C: Hgb A1c MFr Bld  Date/Time Value Ref Range Status  11/19/2022 08:31 AM 9.5 (H) 4.8 - 5.6 % Final    Comment:    (NOTE)         Prediabetes: 5.7 - 6.4         Diabetes: >6.4         Glycemic control for adults with diabetes: <7.0   06/26/2021 11:59 AM 8.3 (H) 4.8 - 5.6 % Final    Comment:    (NOTE) Pre diabetes:          5.7%-6.4%  Diabetes:              >6.4%  Glycemic control for   <7.0% adults with diabetes     CBG: Recent Labs  Lab 11/20/22 0359 11/20/22 0744 11/20/22 1126 11/20/22 1301 11/20/22 1604  GLUCAP 404* 460* 190* 173* 291*       Past  Medical History:  She,  has a past medical history of Anemia, Ankle fracture, Arthritis, Blood transfusion without reported diagnosis, Breast cancer ([place]), Cancer ([place]), Diabetes mellitus without complication ([place]), Dialysis patient ([place]), End stage renal disease on dialysis ([place]), GERD (gastroesophageal reflux disease), Hypertension, OSA (obstructive sleep apnea), Pneumonia, PONV (postoperative nausea and vomiting), and Wears glasses.   Surgical History:   Past Surgical History:  Procedure Laterality Date   ABDOMINAL HYSTERECTOMY     AV FISTULA PLACEMENT  11/2014   at [place] N/A 04/2021   BALLOON DILATION N/A 07/10/2016   Procedure: BALLOON DILATION;  Surgeon: Danie Binder, MD;  Location: [facility];  Service: Endoscopy;  Laterality: N/A;  Pyloric dilation   BREAST LUMPECTOMY     CESAREAN SECTION     CHOLECYSTECTOMY     COLONOSCOPY WITH PROPOFOL N/A 09/27/2016   Dr. Gala Romney: Internal hemorrhoids repeat colonoscopy in 10 years   DILATION AND CURETTAGE OF UTERUS     ESOPHAGOGASTRODUODENOSCOPY N/A 07/10/2016   Dr.Fields- normal esophagus, gastric stenosis was found at the pylorus, gastritis on bx, normal examined duodenun   EXTERNAL FIXATION REMOVAL Right 10/29/2018   Procedure: REMOVAL RIGHT ANKLE BIOMET ZIMMER EXTERNAL FIXATOR, SHORT LEG CAST APPLICATION;  Surgeon: Marybelle Killings, MD;  Location: [facility];  Service: Orthopedics;  Laterality: Right;   MASTECTOMY     left sided   ORIF ANKLE FRACTURE Right 10/06/2018   Procedure: OPEN REDUCTION INTERNAL FIXATION (ORIF) RIGHT ANKLE TRIMALLEOLAR;  Surgeon: Marybelle Killings, MD;  Location: [facility];  Service: Orthopedics;  Laterality: Right;     Social History:   reports that she has never smoked. She has never used smokeless tobacco. She reports that she does  not drink alcohol and does not use drugs.   Family History:  Her family history includes Diabetes Mellitus II in her mother; Heart block in her mother;  Hypertension in her mother, sister, and sister. There is no history of Colon cancer.   Allergies Allergies  Allergen Reactions   Amlodipine Besylate Rash and Other (See Comments)    dizziness   Reglan [Metoclopramide] Other (See Comments)    hallucinations         Christinia Gully, MD Pulmonary and [employer] Cell [phone]   After 7:00 pm call Elink  [phone]

## 2022-11-20 NOTE — Procedures (Signed)
Patient alert and oriented, Dialysis at bedside in ICU 11.  Informed consent signed and in chart.   Treatment initiated: 1368 Treatment completed: 1229  Patient tolerated well as a whole. After the first hour patient stated she had cramps in her side. Pt declined saline flush, requested to stop UF. UF stopped at 0955 (after 500cc taken off). Started back after cramps stopped at 1006, nurse gave patient Bentyl. Pt states she always takes this for cramping before dialysis. Patient slept the remainder of the treatment without pain. Goal lowered from 3L to 2L. Patient BP tolerated well, pt denied symptoms of hypotension when two readings were below SBP 105. Continued to monitor and BP increased on next reading.  Patient is alert and not in acute distress.  Hand-off given to patient's nurse, Helane Rima, RN in ICU at bedside.  Access used: Fistula Access issues: None   Total UF removed: 2 L Medication(s) given: Bentyl, Aranesp, and Iron infusion  Post HD VS: Temperature: 98.0 Blood Pressure: 109/43  Heart Rate: 76 Respiratory Rate: 20 Oxygen Saturation: 95% 4L [place]   Post HD weight: 112.3 kg  Morton Peters, RN, BSN [facility]

## 2022-11-20 NOTE — Progress Notes (Addendum)
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, ESRD (MWF), diabetes mellitus, essential hypertension, GER D, OSA, obliterans bronchiolitis, and chronic respiratory failure on as needed oxygen presenting with shortness of breath x 1-2 days.  The patient states that she has been compliant with dialysis as well as her fluid intake restrictions.  She has been staying the entire time for dialysis sessions.  Her last dialysis was on 11/16/2022.  She denies any fevers, chills, headache, nausea, vomiting, diarrhea.  She had been having some chest pain.  She has a nonproductive cough but denies any hemoptysis.  She has been having some melanotic stools without any hematochezia. The patient saw Dr. Halford Chessman on 10/22/2022 for dyspnea.  She was placed on trial of azithromycin and prednisone taper.  She states that has not helped a whole lot.   ED Course: In the ED she is afebrile, tachypneic, hypoxic requiring BiPAP placement.  Blood pressure is in the 277A-128N systolic.  Chest x-ray shows pulmonary edema.  Blood work reveals LFT elevation with ALT 122 and AST 43, high-sensitivity troponin 62 >> 59, hemoglobin 8.5 and a white count of 15.5.  Nephrology was consulted, she was placed on a nitroglycerin infusion due to elevated blood pressure and we are asked to admit    Assessment/Plan: Acute on chronic respiratory failure with hypoxia -Likely multifactorial including pulmonary edema and related to her obliterans bronchiolitis -Initially on BiPAP>>now on 2L -Appreciate nephrology follow-up -12/4 Echo EF 70-75%, no WMA, G1DD, normal RVF -Check COVID PCR--neg -Continue IV Solu-Medrol -Currently down to 4 L nasal cannula after dialysis   ILD with obliterative bronchiolitis after viral respiratory infection in May 2022  -pulmonary  consult appreciated -continue IV steroids -continue BDs -check PCT 1.12 -10/29/2022 CT chest high-res>>GGO with interlobular septal thickening suggestive of edema.  New peripheral predominant areas of nodular consolidative changes-possible UIP   ESRD/fluid overload -Dialyzed in the evening 11/18/2022 -HD done on 12/5 -Appreciate nephrology   Hypertensive urgency -Started on nitroglycerin drip>>weaned off -Restart home antihypertensive medication and wean off of nitroglycerin -Restarted labetalol>>dose increased -nifedipine stopped -started on minoxidil per pulmonary   Chest pain/elevated troponin -Troponins 62>> 59 -Secondary to demand ischemia -Echo EF 70-75%, no WMA, G1DD, normal RVF -VQ scan--neg PE   Blood loss anemia -Presented with hemoglobin 8.5 -Patient hemoglobin 13.2 on 03/02/2022 -iron sat 14, ferritin 950>>ferrlecit daily IV -FOBT -GI consult appreciate>>plan for EGD 12/6 if resp status stable  Uncontrolled diabetes mellitus type 2 with hyperglcemia -11/19/22 A1C--9.5 -increased to moderate SSI -add novolog 4 units with meals -hold glipizide   Transaminasemia -Suspect hepatic congestion -RUQ US--hepatic steatosis, prominent portal vein   Morbid obesity -BMI 45.93 -Lifestyle modification   OSA-intolerant of CPAP per pulm records    Mixed hyperlipidemia -Continue statin     Family Communication:   mother at bedside updated 12/4   Consultants:  renal   Code Status:  FULL    DVT Prophylaxis:  [place] Heparin      Procedures: As Listed in Progress Note Above   Antibiotics: azithro   Subjective: Pt had episode of n/v and occasional abd pain.  Breathing better.  No f/c, hemoptysis,  Objective: Vitals:   11/20/22 1215 11/20/22 1229 11/20/22 1259 11/20/22 1302  BP: 114/77 (!) 109/43 (!) 100/42   Pulse: 77 75  76   Resp: (!) 26 (!) 22 (!) 23   Temp:  98 F (36.7 C)    TempSrc:  Oral    SpO2: 94% 95% 100%   Weight:    112.3 kg  Height:         Intake/Output Summary (Last 24 hours) at 11/20/2022 1722 Last data filed at 11/20/2022 1229 Gross per 24 hour  Intake 120 ml  Output 2 ml  Net 118 ml   Weight change: 2.915 kg Exam:  General:  Pt is alert, follows commands appropriately, not in acute distress HEENT: No icterus, No thrush, No neck mass, [place]/AT Cardiovascular: RRR, S1/S2, no rubs, no gallops Respiratory: bibasilar crackles.  No wheeze Abdomen: Soft/+BS, non tender, non distended, no guarding Extremities: no edema, No lymphangitis, No petechiae, No rashes, no synovitis   Data Reviewed: I have personally reviewed following labs and imaging studies Basic Metabolic Panel: Recent Labs  Lab 11/18/22 1433 11/19/22 0526  NA 135 138  K 4.4 3.4*  CL 99 101  CO2 19* 24  GLUCOSE 427* 109*  BUN 87* 68*  CREATININE 13.04* 11.14*  CALCIUM 8.7* 8.9  MG  --  1.8   Liver Function Tests: Recent Labs  Lab 11/18/22 1433 11/19/22 0526  AST 43* 34  ALT 122* 105*  ALKPHOS 132* 108  BILITOT 0.7 0.7  PROT 7.1 6.6  ALBUMIN 4.0 3.8   No results for input(s): "LIPASE", "AMYLASE" in the last 168 hours. No results for input(s): "AMMONIA" in the last 168 hours. Coagulation Profile: No results for input(s): "INR", "PROTIME" in the last 168 hours. CBC: Recent Labs  Lab 11/18/22 1433 11/19/22 0526  WBC 15.5* 15.4*  NEUTROABS 13.0*  --   HGB 8.5* 7.9*  HCT 27.3* 25.7*  MCV 91.3 90.5  PLT 232 253   Cardiac Enzymes: No results for input(s): "CKTOTAL", "CKMB", "CKMBINDEX", "TROPONINI" in the last 168 hours. BNP: Invalid input(s): "POCBNP" CBG: Recent Labs  Lab 11/20/22 0359 11/20/22 0744 11/20/22 1126 11/20/22 1301 11/20/22 1604  GLUCAP 404* 460* 190* 173* 291*   HbA1C: Recent Labs    11/19/22 0831  HGBA1C 9.5*   Urine analysis:    Component Value Date/Time   COLORURINE STRAW (A) 12/22/2018 0847   APPEARANCEUR CLEAR 12/22/2018 0847   LABSPEC 1.007 12/22/2018 0847   PHURINE 9.0 (H) 12/22/2018 0847    GLUCOSEU >=500 (A) 12/22/2018 0847   HGBUR NEGATIVE 12/22/2018 0847   [place] 12/22/2018 [place] 12/22/2018 0847   PROTEINUR 100 (A) 12/22/2018 0847   NITRITE NEGATIVE 12/22/2018 0847   LEUKOCYTESUR NEGATIVE 12/22/2018 0847   Sepsis Labs: @LABRCNTIP (procalcitonin:4,lacticidven:4) ) Recent Results (from the past 240 hour(s))  MRSA Next Gen by PCR, Nasal     Status: None   Collection Time: 11/19/22  7:55 AM   Specimen: Nasal Mucosa; Nasal Swab  Result Value Ref Range Status   MRSA by PCR Next Gen NOT DETECTED NOT DETECTED Final    Comment: (NOTE) The GeneXpert MRSA Assay (FDA approved for NASAL specimens only), is one component of a comprehensive MRSA colonization surveillance program. It is not intended to diagnose MRSA infection nor to guide or monitor treatment for MRSA infections. Test performance is not FDA approved in patients less than 75 years old. Performed at [facility], [address] [place], [place] [address]   SARS Coronavirus 2 by RT PCR (hospital order, performed in [facility] hospital lab) *cepheid single result test* Anterior Nasal Swab     Status:  None   Collection Time: 11/19/22  7:55 AM   Specimen: Anterior Nasal Swab  Result Value Ref Range Status   SARS Coronavirus 2 by RT PCR NEGATIVE NEGATIVE Final    Comment: (NOTE) SARS-CoV-2 target nucleic acids are NOT DETECTED.  The SARS-CoV-2 RNA is generally detectable in upper and lower respiratory specimens during the acute phase of infection. The lowest concentration of SARS-CoV-2 viral copies this assay can detect is 250 copies / mL. A negative result does not preclude SARS-CoV-2 infection and should not be used as the sole basis for treatment or other patient management decisions.  A negative result may occur with improper specimen collection / handling, submission of specimen other than nasopharyngeal swab, presence of viral mutation(s) within the areas targeted by this  assay, and inadequate number of viral copies (<250 copies / mL). A negative result must be combined with clinical observations, patient history, and epidemiological information.  Fact Sheet for Patients:   https://www.patel.info/  Fact Sheet for Healthcare Providers: https://hall.com/  This test is not yet approved or  cleared by the [place] FDA and has been authorized for detection and/or diagnosis of SARS-CoV-2 by FDA under an Emergency Use Authorization (EUA).  This EUA will remain in effect (meaning this test can be used) for the duration of the COVID-19 declaration under Section 564(b)(1) of the Act, 21 U.S.C. section 360bbb-3(b)(1), unless the authorization is terminated or revoked sooner.  Performed at [facility], [address] [place], [place] [address]      Scheduled Meds:  aspirin EC  81 mg Oral Daily   azithromycin  250 mg Oral Q M,W,F   Chlorhexidine Gluconate Cloth  6 each Topical Q0600   cinacalcet  30 mg Oral Q breakfast   heparin  5,000 Units Subcutaneous Q8H   insulin aspart  0-15 Units Subcutaneous TID WC   insulin aspart  0-5 Units Subcutaneous QHS   insulin aspart  4 Units Subcutaneous TID WC   labetalol  200 mg Oral BID   linaclotide  290 mcg Oral QAC breakfast   methylPREDNISolone (SOLU-MEDROL) injection  40 mg Intravenous Q12H   minoxidil  5 mg Oral BID   mometasone-formoterol  2 puff Inhalation BID   pantoprazole (PROTONIX) IV  40 mg Intravenous Q12H   rOPINIRole  1 mg Oral BID   rosuvastatin  10 mg Oral Daily   Continuous Infusions:  ferric gluconate (FERRLECIT) IVPB 125 mg (11/20/22 1130)   nitroGLYCERIN Stopped (11/20/22 0845)   promethazine (PHENERGAN) injection (IM or IVPB)      Procedures/Studies: NM Pulmonary Perfusion  Result Date: 11/19/2022 CLINICAL DATA:  Chest pain for 2 days, nonspecific. Shortness of breath for 2 weeks EXAM: NUCLEAR MEDICINE PERFUSION LUNG SCAN TECHNIQUE:  Perfusion images were obtained in multiple projections after intravenous injection of radiopharmaceutical. Ventilation scans intentionally deferred if perfusion scan and chest x-ray adequate for interpretation during COVID 19 epidemic. RADIOPHARMACEUTICALS:  4.17 mCi Tc-69m MAA IV COMPARISON:  Chest radiograph 11/18/2022 FINDINGS: There is a uniform distribution of the radiopharmaceutical within both lungs. No peripheral segmental perfusion defects identified bilaterally to suggest an acute pulmonary embolus. IMPRESSION: No evidence for acute pulmonary embolus. Electronically Signed   By: Kerby Moors M.D.   On: 11/19/2022 13:39   ECHOCARDIOGRAM COMPLETE  Result Date: 11/19/2022    ECHOCARDIOGRAM REPORT   Patient Name:   [facility] Date of Exam: 11/19/2022 Medical Rec #:  [id number]   Height:       63.0 in Accession #:  [id number]  Weight:       259.3 lb Date of Birth:  1971/08/21  BSA:          2.159 m Patient Age:    67 years    BP:           169/46 mmHg Patient Gender: F           HR:           78 bpm. Exam Location:  [facility] Procedure: 2D Echo, Cardiac Doppler and Color Doppler Indications:    Chest Pain  History:        Patient has prior history of Echocardiogram examinations, most                 recent 06/27/2021. CHF, Signs/Symptoms:Chest Pain, Shortness of                 Breath and Dyspnea; Risk Factors:Hypertension and Diabetes.                 Breast CA.  Sonographer:    Wenda Low Referring Phys: [phone] Kerin Cecchi  Sonographer Comments: Patient is obese. IMPRESSIONS  1. Left ventricular ejection fraction, by estimation, is 70 to 75%. The left ventricle has hyperdynamic function. The left ventricle has no regional wall motion abnormalities. There is mild left ventricular hypertrophy. Left ventricular diastolic parameters are consistent with Grade I diastolic dysfunction (impaired relaxation). Elevated left atrial pressure.  2. Right ventricular systolic function is normal. The right  ventricular size is normal. Tricuspid regurgitation signal is inadequate for assessing PA pressure.  3. The mitral valve is normal in structure. No evidence of mitral valve regurgitation. No evidence of mitral stenosis.  4. The tricuspid valve is abnormal.  5. The aortic valve is tricuspid. Aortic valve regurgitation is not visualized. Mild aortic valve stenosis. Aortic valve mean gradient measures 12.0 mmHg. Aortic valve peak gradient measures 27.1 mmHg. Aortic valve area, by VTI measures 2.38 cm.  6. The inferior vena cava is dilated in size with >50% respiratory variability, suggesting right atrial pressure of 8 mmHg. FINDINGS  Left Ventricle: Left ventricular ejection fraction, by estimation, is 70 to 75%. The left ventricle has hyperdynamic function. The left ventricle has no regional wall motion abnormalities. The left ventricular internal cavity size was normal in size. There is mild left ventricular hypertrophy. Left ventricular diastolic parameters are consistent with Grade I diastolic dysfunction (impaired relaxation). Elevated left atrial pressure. Right Ventricle: The right ventricular size is normal. Right vetricular wall thickness was not well visualized. Right ventricular systolic function is normal. Tricuspid regurgitation signal is inadequate for assessing PA pressure. Left Atrium: Left atrial size was normal in size. Right Atrium: Right atrial size was normal in size. Pericardium: There is no evidence of pericardial effusion. Mitral Valve: The mitral valve is normal in structure. There is mild thickening of the mitral valve leaflet(s). There is mild calcification of the mitral valve leaflet(s). Mild mitral annular calcification. No evidence of mitral valve regurgitation. No evidence of mitral valve stenosis. MV peak gradient, 9.5 mmHg. The mean mitral valve gradient is 4.0 mmHg. Tricuspid Valve: The tricuspid valve is abnormal. Tricuspid valve regurgitation is mild . No evidence of tricuspid  stenosis. Aortic Valve: The aortic valve is tricuspid. Aortic valve regurgitation is not visualized. Mild aortic stenosis is present. Aortic valve mean gradient measures 12.0 mmHg. Aortic valve peak gradient measures 27.1 mmHg. Aortic valve area, by VTI measures 2.38 cm. Pulmonic Valve: The pulmonic valve was not well  visualized. Pulmonic valve regurgitation is not visualized. No evidence of pulmonic stenosis. Aorta: The aortic root is normal in size and structure. Venous: The inferior vena cava is dilated in size with greater than 50% respiratory variability, suggesting right atrial pressure of 8 mmHg. IAS/Shunts: No atrial level shunt detected by color flow Doppler.  LEFT VENTRICLE PLAX 2D LVIDd:         4.40 cm   Diastology LVIDs:         2.50 cm   LV e' medial:    6.96 cm/s LV PW:         1.10 cm   LV E/e' medial:  16.5 LV IVS:        1.10 cm   LV e' lateral:   6.74 cm/s LVOT diam:     2.00 cm   LV E/e' lateral: 17.1 LV SV:         116 LV SV Index:   54 LVOT Area:     3.14 cm  RIGHT VENTRICLE RV Basal diam:  4.05 cm RV Mid diam:    3.30 cm RV S prime:     16.80 cm/s TAPSE (M-mode): 3.1 cm LEFT ATRIUM              Index        RIGHT ATRIUM           Index LA diam:        4.70 cm  2.18 cm/m   RA Area:     23.50 cm LA Vol (A2C):   103.0 ml 47.70 ml/m  RA Volume:   72.80 ml  33.72 ml/m LA Vol (A4C):   71.4 ml  33.07 ml/m LA Biplane Vol: 87.1 ml  40.34 ml/m  AORTIC VALVE AV Area (Vmax):    1.95 cm AV Area (Vmean):   1.88 cm AV Area (VTI):     2.38 cm AV Vmax:           260.50 cm/s AV Vmean:          158.500 cm/s AV VTI:            0.489 m AV Peak Grad:      27.1 mmHg AV Mean Grad:      12.0 mmHg LVOT Vmax:         162.00 cm/s LVOT Vmean:        95.100 cm/s LVOT VTI:          0.370 m LVOT/AV VTI ratio: 0.76  AORTA Ao Root diam: 3.00 cm MITRAL VALVE MV Area (PHT): 2.81 cm     SHUNTS MV Area VTI:   2.79 cm     Systemic VTI:  0.37 m MV Peak grad:  9.5 mmHg     Systemic Diam: 2.00 cm MV Mean grad:  4.0 mmHg  MV Vmax:       1.54 m/s MV Vmean:      97.9 cm/s MV Decel Time: 270 msec MV E velocity: 115.00 cm/s MV A velocity: 142.00 cm/s MV E/A ratio:  0.81 Carlyle Dolly MD Electronically signed by Carlyle Dolly MD Signature Date/Time: 11/19/2022/12:03:01 PM    Final    US Abdomen Limited RUQ (LIVER/GB)  Result Date: 11/19/2022 CLINICAL DATA:  Elevated LFTs EXAM: ULTRASOUND ABDOMEN LIMITED RIGHT UPPER QUADRANT COMPARISON:  No prior abdomen ultrasound available, correlation is made with CT abdomen pelvis 07/01/2021 FINDINGS: Gallbladder: Status post cholecystectomy. Common bile duct: Diameter: 5 mm, within normal limits. No intrahepatic biliary ductal dilatation. Liver: No  focal lesion identified. Increased parenchymal echogenicity. Portal vein is patent on color Doppler imaging with normal direction of blood flow towards the liver, although the portal vein and hepatic vein appears somewhat prominent. Other: None. IMPRESSION: 1. Hepatic steatosis. 2. Portal vein and hepatic vein appear somewhat prominent, although there is normal direction of blood flow toward the liver in the portal vein. Electronically Signed   By: Merilyn Baba M.D.   On: 11/19/2022 11:14   DG Chest 2 View  Result Date: 11/18/2022 CLINICAL DATA:  Shortness of breath EXAM: CHEST - 2 VIEW COMPARISON:  10/29/2022 FINDINGS: Moderate cardiomegaly with pulmonary vascular congestion. Diffuse bilateral interstitial and alveolar opacities. No large pleural fluid collection. No pneumothorax. IMPRESSION: Appearance suggests CHF with pulmonary edema. Electronically Signed   By: Davina Poke D.O.   On: 11/18/2022 13:48   CT Chest High Resolution  Result Date: 10/30/2022 CLINICAL DATA:  51 year old female with history of obliterative bronchiolitis. EXAM: CT CHEST WITHOUT CONTRAST TECHNIQUE: Multidetector CT imaging of the chest was performed following the standard protocol without intravenous contrast. High resolution imaging of the lungs, as well as  inspiratory and expiratory imaging, was performed. RADIATION DOSE REDUCTION: This exam was performed according to the departmental dose-optimization program which includes automated exposure control, adjustment of the mA and/or kV according to patient size and/or use of iterative reconstruction technique. COMPARISON:  High-resolution chest CT 10/10/2021. FINDINGS: Cardiovascular: Heart size is mildly enlarged. There is no significant pericardial fluid, thickening or pericardial calcification. There is aortic atherosclerosis, as well as atherosclerosis of the great vessels of the mediastinum and the coronary arteries, including calcified atherosclerotic plaque in the left main and left anterior descending coronary arteries. Mediastinum/Nodes: Enlarged lymph node or conglomeration of lymph nodes in the subcarinal nodal station (axial image 56 of series 3) measuring 2.2 cm in short axis, similar to the prior examination. No other pathologically enlarged mediastinal or hilar lymph nodes are noted on today's noncontrast CT examination. Esophagus is unremarkable in appearance. No axillary lymphadenopathy. Lungs/Pleura: High-resolution images demonstrate a background of very mild diffuse ground-glass attenuation and interlobular septal thickening, which is nonspecific, but can be seen in the setting of mild interstitial edema. Inspiratory and expiratory imaging demonstrates some very mild air trapping, suggesting mild small airways disease. Some patchy peripheral predominant areas of nodular airspace consolidation are also noted scattered throughout the lungs bilaterally, some of which are in association with some mild septal thickening and subpleural reticulation, most evident in the mid to lower lungs. No traction bronchiectasis or honeycombing. No pleural effusions. Upper Abdomen: Aortic atherosclerosis. Musculoskeletal: There are no aggressive appearing lytic or blastic lesions noted in the visualized portions of the  skeleton. IMPRESSION: 1. Unusual appearance of the lungs. Given the presence of cardiomegaly and the background of ground-glass attenuation and interlobular septal thickening, much of the parenchymal changes in the lungs can be attributable to mild interstitial pulmonary edema in the setting of mild congestive heart failure. However, today's study also demonstrates new peripherally predominant areas of nodular consolidative change in septal thickening. This is nonspecific, and could be indicative of interstitial lung disease, but would be categorized as most compatible with an alternative diagnosis (not usual interstitial pneumonia) per current ATS guidelines. Regions of cryptogenic organizing pneumonia (COP) are suspected. Repeat high-resolution chest CT is suggested in 12 months to assess for temporal changes in the appearance of the lung parenchyma. 2. Aortic atherosclerosis, in addition to left main and left anterior descending coronary artery disease. Please note that although the  presence of coronary artery calcium documents the presence of coronary artery disease, the severity of this disease and any potential stenosis cannot be assessed on this non-gated CT examination. Assessment for potential risk factor modification, dietary therapy or pharmacologic therapy may be warranted, if clinically indicated. Electronically Signed   By: Vinnie Langton M.D.   On: 10/30/2022 09:14    Orson Eva, DO  [facility]  If 7PM-7AM, please contact night-coverage [email or web address] Password [email or web address] 11/20/2022, 5:22 PM   LOS: 1 day

## 2022-11-20 NOTE — Procedures (Signed)
I was present at this dialysis session. I have reviewed the session itself and made appropriate changes.   Using AV Fistula.  Goal 3L using 3K bath. No chest pain, still having some SOB, see how UF helps.  PCCM following, minoxidil added. NTG gtt off.  BPs improved.    TSAT 14% and Ferritin 950, give IV Fe and ESA today.   Filed Weights   11/18/22 1923 11/19/22 0756 11/20/22 0844  Weight: 117.6 kg 114.5 kg 114.4 kg    Recent Labs  Lab 11/19/22 0526  NA 138  K 3.4*  CL 101  CO2 24  GLUCOSE 109*  BUN 68*  CREATININE 11.14*  CALCIUM 8.9    Recent Labs  Lab 11/18/22 1433 11/19/22 0526  WBC 15.5* 15.4*  NEUTROABS 13.0*  --   HGB 8.5* 7.9*  HCT 27.3* 25.7*  MCV 91.3 90.5  PLT 232 253    Scheduled Meds:  aspirin EC  81 mg Oral Daily   azithromycin  250 mg Oral Q M,W,F   Chlorhexidine Gluconate Cloth  6 each Topical Q0600   cinacalcet  30 mg Oral Q breakfast   heparin  5,000 Units Subcutaneous Q8H   insulin aspart  0-15 Units Subcutaneous TID WC   insulin aspart  0-5 Units Subcutaneous QHS   insulin aspart  4 Units Subcutaneous TID WC   labetalol  200 mg Oral BID   linaclotide  290 mcg Oral QAC breakfast   methylPREDNISolone (SOLU-MEDROL) injection  40 mg Intravenous Q12H   minoxidil  5 mg Oral BID   mometasone-formoterol  2 puff Inhalation BID   rOPINIRole  1 mg Oral BID   rosuvastatin  10 mg Oral Daily   Continuous Infusions:  nitroGLYCERIN Stopped (11/20/22 0845)   PRN Meds:.acetaminophen **OR** acetaminophen, albuterol, heparin, hydrALAZINE, ondansetron **OR** ondansetron (ZOFRAN) IV, sodium chloride   Pearson Grippe  MD 11/20/2022, 9:07 AM

## 2022-11-20 NOTE — Inpatient Diabetes Management (Addendum)
Inpatient Diabetes Program Recommendations  AACE/ADA: New Consensus Statement on Inpatient Glycemic Control (2015)  Target Ranges:  Prepandial:   less than 140 mg/dL      Peak postprandial:   less than 180 mg/dL (1-2 hours)      Critically ill patients:  140 - 180 mg/dL   Lab Results  Component Value Date   GLUCAP 460 (H) 11/20/2022   HGBA1C 9.5 (H) 11/19/2022    Review of Glycemic Control  Latest Reference Range & Units 11/19/22 12:01 11/19/22 16:24 11/19/22 21:27 11/20/22 03:59 11/20/22 07:44  Glucose-Capillary 70 - 99 mg/dL 184 (H) 306 (H) 428 (H) 404 (H) 460 (H)  (H): Data is abnormally high Diabetes history: Type 2 DM Outpatient Diabetes medications: Glipizide 15 mg BID Current orders for Inpatient glycemic control: Novolog 4 units TID, Novolog 0-15 units TID & HS  Inpatient Diabetes Program Recommendations:    Patient transferred to ICU and noted significant hyperglycemia, assuming due to steroids.  With current glucose trends, consider starting IV insulin per Endotool for hyperglycemia.   Spoke with patient regarding outpatient diabetes. Patient reports recently being on Prednisone which is making her blood sugars more difficult to control. Has been adamant against insulin outpatient. Reviewed patient's current A1c of 9.5%. Explained what a A1c is and what it measures. Also reviewed goal A1c with patient, importance of good glucose control @ home, and blood sugar goals. Reviewed patho of DM, need for improved control of DM, role of pancreas, vascular changes, risk of ILD and steroid use with DM perspective, and other commorbidities.  Patient has a meter and has been checking blood sugars. Reviewed recommended frequency and when to follow up with PCP. Has an endocrinology appointment mid December. Encouraged to discuss DM with steroid use and sulfonylureas while on HD.   At discharge: Recommend changing oral agent given GFR.  No further questions at this  time.   Thanks, Bronson Curb, MSN, RNC-OB Diabetes Coordinator [phone] (8a-5p)

## 2022-11-20 NOTE — Progress Notes (Signed)
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

## 2022-11-21 ENCOUNTER — Encounter (HOSPITAL_COMMUNITY)
Admission: EM | Disposition: A | Discharge status: Home / Self Care | Payer: Self-pay | Source: Home / Self Care | Attending: Internal Medicine

## 2022-11-21 ENCOUNTER — Inpatient Hospital Stay (HOSPITAL_COMMUNITY): Payer: Medicare Other | Admitting: Anesthesiology

## 2022-11-21 ENCOUNTER — Encounter (HOSPITAL_COMMUNITY): Payer: Self-pay | Admitting: Internal Medicine

## 2022-11-21 DIAGNOSIS — J8489 Other specified interstitial pulmonary diseases: Secondary | ICD-10-CM

## 2022-11-21 DIAGNOSIS — G4730 Sleep apnea, unspecified: Secondary | ICD-10-CM

## 2022-11-21 DIAGNOSIS — N186 End stage renal disease: Secondary | ICD-10-CM | POA: Diagnosis not present

## 2022-11-21 DIAGNOSIS — J810 Acute pulmonary edema: Secondary | ICD-10-CM | POA: Diagnosis not present

## 2022-11-21 DIAGNOSIS — D649 Anemia, unspecified: Secondary | ICD-10-CM

## 2022-11-21 DIAGNOSIS — M1990 Unspecified osteoarthritis, unspecified site: Secondary | ICD-10-CM

## 2022-11-21 DIAGNOSIS — J189 Pneumonia, unspecified organism: Secondary | ICD-10-CM

## 2022-11-21 HISTORY — PX: ESOPHAGOGASTRODUODENOSCOPY (EGD) WITH PROPOFOL: SHX5813

## 2022-11-21 LAB — GLUCOSE, CAPILLARY
Glucose-Capillary: 256 mg/dL — ABNORMAL HIGH (ref 70–99)
Glucose-Capillary: 200 mg/dL — ABNORMAL HIGH (ref 70–99)
Glucose-Capillary: 229 mg/dL — ABNORMAL HIGH (ref 70–99)
Glucose-Capillary: 245 mg/dL — ABNORMAL HIGH (ref 70–99)
Glucose-Capillary: 323 mg/dL — ABNORMAL HIGH (ref 70–99)

## 2022-11-21 LAB — COMPREHENSIVE METABOLIC PANEL
ALT: 63 U/L — ABNORMAL HIGH (ref 0–44)
AST: 18 U/L (ref 15–41)
Albumin: 3.6 g/dL (ref 3.5–5.0)
Alkaline Phosphatase: 97 U/L (ref 38–126)
Anion gap: 14 (ref 5–15)
BUN: 60 mg/dL — ABNORMAL HIGH (ref 6–20)
CO2: 26 mmol/L (ref 22–32)
Calcium: 8.6 mg/dL — ABNORMAL LOW (ref 8.9–10.3)
Chloride: 95 mmol/L — ABNORMAL LOW (ref 98–111)
Creatinine, Ser: 10.03 mg/dL — ABNORMAL HIGH (ref 0.44–1.00)
GFR, Estimated: 4 mL/min — ABNORMAL LOW (ref >60)
Glucose, Bld: 272 mg/dL — ABNORMAL HIGH (ref 70–99)
Potassium: 4.6 mmol/L (ref 3.5–5.1)
Sodium: 135 mmol/L (ref 135–145)
Total Bilirubin: 0.2 mg/dL — ABNORMAL LOW (ref 0.3–1.2)
Total Protein: 6.4 g/dL — ABNORMAL LOW (ref 6.5–8.1)

## 2022-11-21 LAB — CBC WITH DIFFERENTIAL/PLATELET
Abs Immature Granulocytes: 0.13 10*3/uL — ABNORMAL HIGH (ref 0.00–0.07)
Basophils Absolute: 0 10*3/uL (ref 0.0–0.1)
Basophils Relative: 0 %
Eosinophils Absolute: 0 10*3/uL (ref 0.0–0.5)
Eosinophils Relative: 0 %
HCT: 25.8 % — ABNORMAL LOW (ref 36.0–46.0)
Hemoglobin: 8.2 g/dL — ABNORMAL LOW (ref 12.0–15.0)
Immature Granulocytes: 1 %
Lymphocytes Relative: 4 %
Lymphs Abs: 0.6 10*3/uL — ABNORMAL LOW (ref 0.7–4.0)
MCH: 28.7 pg (ref 26.0–34.0)
MCHC: 31.8 g/dL (ref 30.0–36.0)
MCV: 90.2 fL (ref 80.0–100.0)
Monocytes Absolute: 0.4 10*3/uL (ref 0.1–1.0)
Monocytes Relative: 3 %
Neutro Abs: 14.1 10*3/uL — ABNORMAL HIGH (ref 1.7–7.7)
Neutrophils Relative %: 92 %
Platelets: 231 10*3/uL (ref 150–400)
RBC: 2.86 MIL/uL — ABNORMAL LOW (ref 3.87–5.11)
RDW: 15.8 % — ABNORMAL HIGH (ref 11.5–15.5)
WBC: 15.2 10*3/uL — ABNORMAL HIGH (ref 4.0–10.5)
nRBC: 0 % (ref 0.0–0.2)

## 2022-11-21 SURGERY — ESOPHAGOGASTRODUODENOSCOPY (EGD) WITH PROPOFOL
Anesthesia: General

## 2022-11-21 MED ORDER — LACTATED RINGERS IV SOLN: INTRAVENOUS | Status: DC

## 2022-11-21 MED ORDER — IPRATROPIUM-ALBUTEROL 0.5-2.5 (3) MG/3ML IN SOLN
3.0000 mL | Freq: Once | RESPIRATORY_TRACT | Status: AC
  Administered 2022-11-21: 3 mL via RESPIRATORY_TRACT

## 2022-11-21 MED ORDER — PHENYLEPHRINE HCL (PRESSORS) 10 MG/ML IV SOLN
INTRAVENOUS | Status: DC | PRN
  Administered 2022-11-21 (×2): 80 ug via INTRAVENOUS

## 2022-11-21 MED ORDER — PROPOFOL 10 MG/ML IV BOLUS
INTRAVENOUS | Status: DC | PRN
  Administered 2022-11-21: 50 mg via INTRAVENOUS

## 2022-11-21 MED ORDER — LIDOCAINE VISCOUS HCL 2 % MT SOLN
OROMUCOSAL | Status: AC
  Administered 2022-11-21: 15 mL via OROMUCOSAL
  Filled 2022-11-21: qty 15

## 2022-11-21 MED ORDER — SODIUM CHLORIDE 0.9 % IV SOLN: INTRAVENOUS | Status: DC

## 2022-11-21 MED ORDER — IPRATROPIUM-ALBUTEROL 0.5-2.5 (3) MG/3ML IN SOLN
RESPIRATORY_TRACT | Status: AC
  Filled 2022-11-21: qty 3

## 2022-11-21 MED ORDER — LIDOCAINE VISCOUS HCL 2 % MT SOLN: 15.0000 mL | Freq: Once | OROMUCOSAL | Status: AC

## 2022-11-21 NOTE — Brief Op Note (Signed)
11/18/2022 - 11/21/2022  3:39 PM  PATIENT:  Patricia [name]  51 y.o. female  PRE-OPERATIVE DIAGNOSIS:  anemia  POST-OPERATIVE DIAGNOSIS:  normal exam  PROCEDURE:  Procedure(s): ESOPHAGOGASTRODUODENOSCOPY (EGD) WITH PROPOFOL (N/A)  SURGEON:  Surgeon(s) and Role:    * Harvel Quale, MD - Primary  Patient underwent EGD under propofol sedation.  Tolerated the procedure but presented transient desaturation due to body habitus and chronic lung disease, which limited the inspection.   Findings: - Normal esophagus.  - Normal stomach.  - Normal examined duodenum.   RECOMMENDATIONS - Return patient to hospital ward for ongoing care.  - Resume previous diet.  - Check daily H/H - Will hold off on endoscopic procedures for now unless she has overt gastrointestinal bleeding. - Consider outpatient colonoscopy once BP is better controlled and pulmonary disease is better compensated.  Maylon Peppers, MD Gastroenterology and Hepatology [facility]

## 2022-11-21 NOTE — Anesthesia Postprocedure Evaluation (Signed)
Anesthesia Post Note  Patient: Patricia [name]  Procedure(s) Performed: ESOPHAGOGASTRODUODENOSCOPY (EGD) WITH PROPOFOL  Patient location during evaluation: PACU Anesthesia Type: General Level of consciousness: awake and alert and oriented Pain management: pain level controlled Vital Signs Assessment: post-procedure vital signs reviewed and stable Respiratory status: spontaneous breathing, nonlabored ventilation, respiratory function stable and patient connected to nasal cannula oxygen Cardiovascular status: blood pressure returned to baseline and stable Postop Assessment: no apparent nausea or vomiting Anesthetic complications: no  No notable events documented.   Last Vitals:  Vitals:   11/21/22 1545 11/21/22 1600  BP: (!) 97/44 (!) 96/39  Pulse: (!) 33 72  Resp: (!) 30 16  Temp: 36.7 C   SpO2: 96% 97%    Last Pain:  Vitals:   11/21/22 1600  TempSrc:   PainSc: 0-No pain                 Mieczyslaw Stamas C Durant Scibilia

## 2022-11-21 NOTE — Op Note (Signed)
[facility] Patient Name: Patricia [name] Procedure Date: 11/21/2022 3:05 PM MRN: [id number] Date of Birth: [birth year] Attending MD: Maylon Peppers , , [id number] CSN: [id number] Age: 51 Admit Type: Outpatient Procedure:                Upper GI endoscopy Indications:              Anemia Providers:                Maylon Peppers, Lambert Mody, Everardo Pacific Referring MD:              Medicines:                Monitored Anesthesia Care Complications:            No immediate complications. Estimated Blood Loss:     Estimated blood loss: none. Procedure:                Pre-Anesthesia Assessment:                           - Prior to the procedure, a History and Physical                            was performed, and patient medications, allergies                            and sensitivities were reviewed. The patient's                            tolerance of previous anesthesia was reviewed.                           - The risks and benefits of the procedure and the                            sedation options and risks were discussed with the                            patient. All questions were answered and informed                            consent was obtained.                           - ASA Grade Assessment: IV - A patient with severe                            systemic disease that is a constant threat to life.                           After obtaining informed consent, the endoscope was                            passed under direct vision. Throughout the  procedure, the patient's blood pressure, pulse, and                            oxygen saturations were monitored continuously. The                            [id number]-[id number] ([id number]) scope was introduced through the                            mouth, and advanced to the second part of duodenum.                            The upper GI endoscopy was technically difficult                             and complex due to the patient's body habitus and                            the patient's oxygen desaturation. The patient                            tolerated the procedure. Scope In: 3:32:09 PM Scope Out: 3:35:32 PM Total Procedure Duration: 0 hours 3 minutes 23 seconds  Findings:      The examined esophagus was normal.      The entire examined stomach was normal.      The examined duodenum was normal.      Note:Tolerated the procedure but presented transient desaturation due to       body habitus and chronic lung disease, which limited the inspection. Impression:               - Normal esophagus.                           - Normal stomach.                           - Normal examined duodenum.                           - No specimens collected. Moderate Sedation:      Per Anesthesia Care Recommendation:           - Return patient to hospital ward for ongoing care.                           - Resume previous diet.                           - Check daily H/H                           - Will hold off on endoscopic procedures for now                            unless she has overt gastrointestinal bleeding.                           -  Consider outpatient colonoscopy once BP is better                            controlled and pulmonary disease is better                            compensated. Procedure Code(s):        --- Professional ---                           [phone], Esophagogastroduodenoscopy, flexible,                            transoral; diagnostic, including collection of                            specimen(s) by brushing or washing, when performed                            (separate procedure) Diagnosis Code(s):        --- Professional ---                           D64.9, Anemia, unspecified CPT copyright 2022 [employer]. All rights reserved. The codes documented in this report are preliminary and upon coder review may  be  revised to meet current compliance requirements. Maylon Peppers, MD Maylon Peppers,  11/21/2022 3:44:59 PM This report has been signed electronically. Number of Addenda: 0

## 2022-11-21 NOTE — Progress Notes (Signed)
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  The patient states that she has been compliant with dialysis as well as her fluid intake restrictions.  She has been staying the entire time for dialysis sessions.  Her last dialysis was on 11/16/2022.  She denies any fevers, chills, headache, nausea, vomiting, diarrhea.  She had been having some chest pain.  She has a nonproductive cough but denies any hemoptysis.  She has been having some melanotic stools without any hematochezia. The patient saw Dr. Halford Chessman on 10/22/2022 for dyspnea.  She was placed on trial of azithromycin and prednisone taper.  She states that has not helped a whole lot.   ED Course: In the ED she is afebrile, tachypneic, hypoxic requiring BiPAP placement.  Blood pressure is in the 789F-810F systolic.  Chest x-ray shows pulmonary edema.  Blood work reveals LFT elevation with ALT 122 and AST 43, high-sensitivity troponin 62 >> 59, hemoglobin 8.5 and a white count of 15.5.  Nephrology was consulted, she was placed on a nitroglycerin infusion due to elevated blood pressure and we are asked to admit    Assessment/Plan: Acute on chronic respiratory failure with hypoxia -Likely multifactorial including pulmonary edema and related to her obliterans bronchiolitis -Initially on BiPAP>>now on 2L demonstrating good oxygen saturation. -Appreciate nephrology and pulmonary follow-up -12/4 Echo EF 70-75%, no WMA, G1DD, normal RVF -Check COVID PCR--neg -Continue IV Solu-Medrol and bronchodilator management.   ILD with obliterative bronchiolitis after viral respiratory  infection in May 2022  -pulmonary consult appreciated -continue IV steroids -continue BDs and maintain saturation above 95% as per pulmonology recommendation. -check PCT 1.12 -10/29/2022 CT chest high-res>>GGO with interlobular septal thickening suggestive of edema.  New peripheral predominant areas of nodular consolidative changes-possible UIP -Plan is to follow-up as an outpatient after discharge.   ESRD/fluid overload -Dialyzed in the evening 11/18/2022 -HD last done on 12/5 -Appreciate nephrology assistance and recommendations; next treatment 11/22/2022.   Hypertensive urgency -Started on nitroglycerin drip>>weaned off -Restart home antihypertensive medication and wean off of nitroglycerin -Restarted labetalol>>dose increased -nifedipine stopped/discontinue; continue minoxidil as recommended by pulmonology. -Blood pressure appears to be stable today.   Chest pain/elevated troponin -Troponins 62>> 59 -Secondary to demand ischemia -Echo EF 70-75%, no WMA, G1DD, normal RVF -VQ scan--neg PE -Currently chest pain-free.   Blood loss anemia -Presented with hemoglobin 8.5 -Patient hemoglobin 13.2 on 03/02/2022 -iron sat 14, ferritin 950>>ferrlecit daily IV -FOBT -GI consult appreciate>>plan for EGD later today 12/6 if resp status stable.  Will follow results and further recommendations.  Uncontrolled diabetes mellitus type 2 with hyperglcemia -11/19/22 A1C--9.5 -increased to moderate SSI -add novolog 4 units with meals -hold glipizide   Transaminasemia -Suspect hepatic congestion -RUQ US--hepatic steatosis, prominent portal vein   Morbid obesity -BMI 45.93 -Low calorie diet, portion control and increase physical activity discussed with patient.   OSA-intolerant of CPAP per pulm records. -Continue oxygen supplementation.   Mixed hyperlipidemia -Continue statin     Family Communication:   mother at bedside updated 12/4   Consultants:  renal   Code Status:  FULL     DVT Prophylaxis:  [place] Heparin  Procedures: As Listed in Progress Note Above   Antibiotics: azithro   Subjective: Reports intermittent nausea/vomiting; no fever, reports stable breathing and not chest pain today.  Objective: Vitals:   11/21/22 0751 11/21/22 0800 11/21/22 0805 11/21/22 0900  BP:  (!) 118/54  128/69  Pulse:  85  82  Resp:  15    Temp:   98 F (36.7 C)   TempSrc:   Oral   SpO2: 96% 98%  95%  Weight:      Height:        Intake/Output Summary (Last 24 hours) at 11/21/2022 1101 Last data filed at 11/20/2022 1955 Gross per 24 hour  Intake 577.96 ml  Output 2 ml  Net 575.96 ml   Weight change: -0.1 kg  Exam: General exam: Alert, awake, oriented x 3; no chest pain, reporting some intermittent nausea but currently no vomiting.  Patient is afebrile and expressing improvement in her breathing. Respiratory system: Diffuse rhonchi bilaterally; mild expiratory wheezing appreciated.  No using accessory muscles. Cardiovascular system:RRR. No rubs or gallops; unable to assess JVD with body habitus. Gastrointestinal system: Abdomen is obese, nondistended, soft and nontender. No organomegaly or masses felt. Normal bowel sounds heard. Central nervous system: Alert and oriented. No focal neurological deficits. Extremities: No cyanosis or clubbing; trace to 1+ edema appreciated bilaterally. Skin: No petechiae. Psychiatry: Judgement and insight appear normal. Mood & affect appropriate.    Data Reviewed: I have personally reviewed following labs and imaging studies  Basic Metabolic Panel: Recent Labs  Lab 11/18/22 1433 11/19/22 0526 11/21/22 0427  NA 135 138 135  K 4.4 3.4* 4.6  CL 99 101 95*  CO2 19* 24 26  GLUCOSE 427* 109* 272*  BUN 87* 68* 60*  CREATININE 13.04* 11.14* 10.03*  CALCIUM 8.7* 8.9 8.6*  MG  --  1.8  --    Liver Function Tests: Recent Labs  Lab 11/18/22 1433 11/19/22 0526 11/21/22 0427  AST 43* 34 18  ALT 122* 105* 63*  ALKPHOS 132*  108 97  BILITOT 0.7 0.7 0.2*  PROT 7.1 6.6 6.4*  ALBUMIN 4.0 3.8 3.6   No results for input(s): "LIPASE", "AMYLASE" in the last 168 hours. No results for input(s): "AMMONIA" in the last 168 hours. Coagulation Profile: No results for input(s): "INR", "PROTIME" in the last 168 hours. CBC: Recent Labs  Lab 11/18/22 1433 11/19/22 0526 11/21/22 0427  WBC 15.5* 15.4* 15.2*  NEUTROABS 13.0*  --  14.1*  HGB 8.5* 7.9* 8.2*  HCT 27.3* 25.7* 25.8*  MCV 91.3 90.5 90.2  PLT 232 253 231   CBG: Recent Labs  Lab 11/20/22 1126 11/20/22 1301 11/20/22 1604 11/20/22 2123 11/21/22 0805  GLUCAP 190* 173* 291* 242* 256*   HbA1C: Recent Labs    11/19/22 0831  HGBA1C 9.5*   Urine analysis:    Component Value Date/Time   COLORURINE STRAW (A) 12/22/2018 0847   APPEARANCEUR CLEAR 12/22/2018 0847   LABSPEC 1.007 12/22/2018 0847   PHURINE 9.0 (H) 12/22/2018 0847   GLUCOSEU >=500 (A) 12/22/2018 0847   HGBUR NEGATIVE 12/22/2018 [place] 12/22/2018 [place] 12/22/2018 0847   PROTEINUR 100 (A) 12/22/2018 0847   NITRITE NEGATIVE 12/22/2018 0847   LEUKOCYTESUR NEGATIVE 12/22/2018 0847   Sepsis Labs: @LABRCNTIP (procalcitonin:4,lacticidven:4) ) Recent Results (from the past 240 hour(s))  MRSA Next Gen by PCR, Nasal     Status: None   Collection Time: 11/19/22  7:55 AM   Specimen: Nasal Mucosa; Nasal Swab  Result Value Ref Range Status   MRSA by PCR Next Gen NOT DETECTED NOT DETECTED Final    Comment: (NOTE) The GeneXpert MRSA Assay (FDA approved for NASAL specimens only), is one component of a comprehensive MRSA colonization surveillance program. It is not intended to diagnose MRSA infection nor to guide or monitor treatment for MRSA infections. Test performance is not FDA approved in patients less than 61 years old. Performed at [facility], [address], [place], [place] [address]   SARS Coronavirus 2 by RT PCR (hospital order, performed in [facility] hospital lab) *cepheid single result test* Anterior Nasal Swab     Status: None   Collection Time: 11/19/22  7:55 AM   Specimen: Anterior Nasal Swab  Result Value Ref Range Status   SARS Coronavirus 2 by RT PCR NEGATIVE NEGATIVE Final    Comment: (NOTE) SARS-CoV-2 target nucleic acids are NOT DETECTED.  The SARS-CoV-2 RNA is generally detectable in upper and lower respiratory specimens during the acute phase of infection. The lowest concentration of SARS-CoV-2 viral copies this assay can detect is 250 copies / mL. A negative result does not preclude SARS-CoV-2 infection and should not be used as the sole basis for treatment or other patient management decisions.  A negative result may occur with improper specimen collection / handling, submission of specimen other than nasopharyngeal swab, presence of viral mutation(s) within the areas targeted by this assay, and inadequate number of viral copies (<250 copies / mL). A negative result must be combined with clinical observations, patient history, and epidemiological information.  Fact Sheet for Patients:   https://www.patel.info/  Fact Sheet for Healthcare Providers: https://hall.com/  This test is not yet approved or  cleared by the [place] FDA and has been authorized for detection and/or diagnosis of SARS-CoV-2 by FDA under an Emergency Use Authorization (EUA).  This EUA will remain in effect (meaning this test can be used) for the duration of the COVID-19 declaration under Section 564(b)(1) of the Act, 21 U.S.C. section 360bbb-3(b)(1), unless the authorization is terminated or revoked sooner.  Performed at [facility], [address], [place], [place] [address]      Scheduled Meds:  aspirin EC  81 mg Oral Daily   azithromycin  250 mg Oral Q M,W,F   Chlorhexidine Gluconate Cloth  6 each Topical Q0600   cinacalcet  30 mg Oral Q breakfast   insulin aspart  0-15 Units  Subcutaneous TID WC   insulin aspart  0-5 Units Subcutaneous QHS   insulin aspart  4 Units Subcutaneous TID WC   labetalol  200 mg Oral BID   linaclotide  290 mcg Oral QAC breakfast   methylPREDNISolone (SOLU-MEDROL) injection  40 mg Intravenous Q12H   minoxidil  5 mg Oral BID   mometasone-formoterol  2 puff Inhalation BID   pantoprazole (PROTONIX) IV  40 mg Intravenous Q12H   rOPINIRole  1 mg Oral BID   rosuvastatin  10 mg Oral Daily   Continuous Infusions:  ferric gluconate (FERRLECIT) IVPB 125 mg (11/21/22 0920)   nitroGLYCERIN Stopped (11/20/22 0845)    Procedures/Studies: NM Pulmonary Perfusion  Result Date: 11/19/2022 CLINICAL DATA:  Chest pain for 2 days, nonspecific. Shortness of breath for 2 weeks EXAM: NUCLEAR MEDICINE PERFUSION LUNG SCAN TECHNIQUE: Perfusion images were obtained in multiple projections after intravenous injection of radiopharmaceutical. Ventilation scans intentionally deferred if perfusion scan and chest x-ray adequate for interpretation during COVID 19 epidemic. RADIOPHARMACEUTICALS:  4.17 mCi Tc-23m MAA IV COMPARISON:  Chest  radiograph 11/18/2022 FINDINGS: There is a uniform distribution of the radiopharmaceutical within both lungs. No peripheral segmental perfusion defects identified bilaterally to suggest an acute pulmonary embolus. IMPRESSION: No evidence for acute pulmonary embolus. Electronically Signed   By: Kerby Moors M.D.   On: 11/19/2022 13:39   ECHOCARDIOGRAM COMPLETE  Result Date: 11/19/2022    ECHOCARDIOGRAM REPORT   Patient Name:   [facility] Date of Exam: 11/19/2022 Medical Rec #:  [id number]   Height:       63.0 in Accession #:    [id number]  Weight:       259.3 lb Date of Birth:  1971/07/10  BSA:          2.159 m Patient Age:    13 years    BP:           169/46 mmHg Patient Gender: F           HR:           78 bpm. Exam Location:  [facility] Procedure: 2D Echo, Cardiac Doppler and Color Doppler Indications:    Chest Pain  History:         Patient has prior history of Echocardiogram examinations, most                 recent 06/27/2021. CHF, Signs/Symptoms:Chest Pain, Shortness of                 Breath and Dyspnea; Risk Factors:Hypertension and Diabetes.                 Breast CA.  Sonographer:    Wenda Low Referring Phys: [phone] DAVID TAT  Sonographer Comments: Patient is obese. IMPRESSIONS  1. Left ventricular ejection fraction, by estimation, is 70 to 75%. The left ventricle has hyperdynamic function. The left ventricle has no regional wall motion abnormalities. There is mild left ventricular hypertrophy. Left ventricular diastolic parameters are consistent with Grade I diastolic dysfunction (impaired relaxation). Elevated left atrial pressure.  2. Right ventricular systolic function is normal. The right ventricular size is normal. Tricuspid regurgitation signal is inadequate for assessing PA pressure.  3. The mitral valve is normal in structure. No evidence of mitral valve regurgitation. No evidence of mitral stenosis.  4. The tricuspid valve is abnormal.  5. The aortic valve is tricuspid. Aortic valve regurgitation is not visualized. Mild aortic valve stenosis. Aortic valve mean gradient measures 12.0 mmHg. Aortic valve peak gradient measures 27.1 mmHg. Aortic valve area, by VTI measures 2.38 cm.  6. The inferior vena cava is dilated in size with >50% respiratory variability, suggesting right atrial pressure of 8 mmHg. FINDINGS  Left Ventricle: Left ventricular ejection fraction, by estimation, is 70 to 75%. The left ventricle has hyperdynamic function. The left ventricle has no regional wall motion abnormalities. The left ventricular internal cavity size was normal in size. There is mild left ventricular hypertrophy. Left ventricular diastolic parameters are consistent with Grade I diastolic dysfunction (impaired relaxation). Elevated left atrial pressure. Right Ventricle: The right ventricular size is normal. Right vetricular wall  thickness was not well visualized. Right ventricular systolic function is normal. Tricuspid regurgitation signal is inadequate for assessing PA pressure. Left Atrium: Left atrial size was normal in size. Right Atrium: Right atrial size was normal in size. Pericardium: There is no evidence of pericardial effusion. Mitral Valve: The mitral valve is normal in structure. There is mild thickening of the mitral valve leaflet(s). There is mild calcification of the mitral valve leaflet(s). Mild  mitral annular calcification. No evidence of mitral valve regurgitation. No evidence of mitral valve stenosis. MV peak gradient, 9.5 mmHg. The mean mitral valve gradient is 4.0 mmHg. Tricuspid Valve: The tricuspid valve is abnormal. Tricuspid valve regurgitation is mild . No evidence of tricuspid stenosis. Aortic Valve: The aortic valve is tricuspid. Aortic valve regurgitation is not visualized. Mild aortic stenosis is present. Aortic valve mean gradient measures 12.0 mmHg. Aortic valve peak gradient measures 27.1 mmHg. Aortic valve area, by VTI measures 2.38 cm. Pulmonic Valve: The pulmonic valve was not well visualized. Pulmonic valve regurgitation is not visualized. No evidence of pulmonic stenosis. Aorta: The aortic root is normal in size and structure. Venous: The inferior vena cava is dilated in size with greater than 50% respiratory variability, suggesting right atrial pressure of 8 mmHg. IAS/Shunts: No atrial level shunt detected by color flow Doppler.  LEFT VENTRICLE PLAX 2D LVIDd:         4.40 cm   Diastology LVIDs:         2.50 cm   LV e' medial:    6.96 cm/s LV PW:         1.10 cm   LV E/e' medial:  16.5 LV IVS:        1.10 cm   LV e' lateral:   6.74 cm/s LVOT diam:     2.00 cm   LV E/e' lateral: 17.1 LV SV:         116 LV SV Index:   54 LVOT Area:     3.14 cm  RIGHT VENTRICLE RV Basal diam:  4.05 cm RV Mid diam:    3.30 cm RV S prime:     16.80 cm/s TAPSE (M-mode): 3.1 cm LEFT ATRIUM              Index        RIGHT  ATRIUM           Index LA diam:        4.70 cm  2.18 cm/m   RA Area:     23.50 cm LA Vol (A2C):   103.0 ml 47.70 ml/m  RA Volume:   72.80 ml  33.72 ml/m LA Vol (A4C):   71.4 ml  33.07 ml/m LA Biplane Vol: 87.1 ml  40.34 ml/m  AORTIC VALVE AV Area (Vmax):    1.95 cm AV Area (Vmean):   1.88 cm AV Area (VTI):     2.38 cm AV Vmax:           260.50 cm/s AV Vmean:          158.500 cm/s AV VTI:            0.489 m AV Peak Grad:      27.1 mmHg AV Mean Grad:      12.0 mmHg LVOT Vmax:         162.00 cm/s LVOT Vmean:        95.100 cm/s LVOT VTI:          0.370 m LVOT/AV VTI ratio: 0.76  AORTA Ao Root diam: 3.00 cm MITRAL VALVE MV Area (PHT): 2.81 cm     SHUNTS MV Area VTI:   2.79 cm     Systemic VTI:  0.37 m MV Peak grad:  9.5 mmHg     Systemic Diam: 2.00 cm MV Mean grad:  4.0 mmHg MV Vmax:       1.54 m/s MV Vmean:      97.9 cm/s MV Decel Time:  270 msec MV E velocity: 115.00 cm/s MV A velocity: 142.00 cm/s MV E/A ratio:  0.81 Carlyle Dolly MD Electronically signed by Carlyle Dolly MD Signature Date/Time: 11/19/2022/12:03:01 PM    Final    US Abdomen Limited RUQ (LIVER/GB)  Result Date: 11/19/2022 CLINICAL DATA:  Elevated LFTs EXAM: ULTRASOUND ABDOMEN LIMITED RIGHT UPPER QUADRANT COMPARISON:  No prior abdomen ultrasound available, correlation is made with CT abdomen pelvis 07/01/2021 FINDINGS: Gallbladder: Status post cholecystectomy. Common bile duct: Diameter: 5 mm, within normal limits. No intrahepatic biliary ductal dilatation. Liver: No focal lesion identified. Increased parenchymal echogenicity. Portal vein is patent on color Doppler imaging with normal direction of blood flow towards the liver, although the portal vein and hepatic vein appears somewhat prominent. Other: None. IMPRESSION: 1. Hepatic steatosis. 2. Portal vein and hepatic vein appear somewhat prominent, although there is normal direction of blood flow toward the liver in the portal vein. Electronically Signed   By: Merilyn Baba M.D.   On:  11/19/2022 11:14   DG Chest 2 View  Result Date: 11/18/2022 CLINICAL DATA:  Shortness of breath EXAM: CHEST - 2 VIEW COMPARISON:  10/29/2022 FINDINGS: Moderate cardiomegaly with pulmonary vascular congestion. Diffuse bilateral interstitial and alveolar opacities. No large pleural fluid collection. No pneumothorax. IMPRESSION: Appearance suggests CHF with pulmonary edema. Electronically Signed   By: Davina Poke D.O.   On: 11/18/2022 13:48   CT Chest High Resolution  Result Date: 10/30/2022 CLINICAL DATA:  51 year old female with history of obliterative bronchiolitis. EXAM: CT CHEST WITHOUT CONTRAST TECHNIQUE: Multidetector CT imaging of the chest was performed following the standard protocol without intravenous contrast. High resolution imaging of the lungs, as well as inspiratory and expiratory imaging, was performed. RADIATION DOSE REDUCTION: This exam was performed according to the departmental dose-optimization program which includes automated exposure control, adjustment of the mA and/or kV according to patient size and/or use of iterative reconstruction technique. COMPARISON:  High-resolution chest CT 10/10/2021. FINDINGS: Cardiovascular: Heart size is mildly enlarged. There is no significant pericardial fluid, thickening or pericardial calcification. There is aortic atherosclerosis, as well as atherosclerosis of the great vessels of the mediastinum and the coronary arteries, including calcified atherosclerotic plaque in the left main and left anterior descending coronary arteries. Mediastinum/Nodes: Enlarged lymph node or conglomeration of lymph nodes in the subcarinal nodal station (axial image 56 of series 3) measuring 2.2 cm in short axis, similar to the prior examination. No other pathologically enlarged mediastinal or hilar lymph nodes are noted on today's noncontrast CT examination. Esophagus is unremarkable in appearance. No axillary lymphadenopathy. Lungs/Pleura: High-resolution images  demonstrate a background of very mild diffuse ground-glass attenuation and interlobular septal thickening, which is nonspecific, but can be seen in the setting of mild interstitial edema. Inspiratory and expiratory imaging demonstrates some very mild air trapping, suggesting mild small airways disease. Some patchy peripheral predominant areas of nodular airspace consolidation are also noted scattered throughout the lungs bilaterally, some of which are in association with some mild septal thickening and subpleural reticulation, most evident in the mid to lower lungs. No traction bronchiectasis or honeycombing. No pleural effusions. Upper Abdomen: Aortic atherosclerosis. Musculoskeletal: There are no aggressive appearing lytic or blastic lesions noted in the visualized portions of the skeleton. IMPRESSION: 1. Unusual appearance of the lungs. Given the presence of cardiomegaly and the background of ground-glass attenuation and interlobular septal thickening, much of the parenchymal changes in the lungs can be attributable to mild interstitial pulmonary edema in the setting of mild congestive heart failure. However,  today's study also demonstrates new peripherally predominant areas of nodular consolidative change in septal thickening. This is nonspecific, and could be indicative of interstitial lung disease, but would be categorized as most compatible with an alternative diagnosis (not usual interstitial pneumonia) per current ATS guidelines. Regions of cryptogenic organizing pneumonia (COP) are suspected. Repeat high-resolution chest CT is suggested in 12 months to assess for temporal changes in the appearance of the lung parenchyma. 2. Aortic atherosclerosis, in addition to left main and left anterior descending coronary artery disease. Please note that although the presence of coronary artery calcium documents the presence of coronary artery disease, the severity of this disease and any potential stenosis cannot be  assessed on this non-gated CT examination. Assessment for potential risk factor modification, dietary therapy or pharmacologic therapy may be warranted, if clinically indicated. Electronically Signed   By: Vinnie Langton M.D.   On: 10/30/2022 09:14    Barton Dubois, MD  [facility]  If 7PM-7AM, please contact night-coverage [email or web address] Password [email or web address] 11/21/2022, 11:01 AM   LOS: 2 days

## 2022-11-21 NOTE — Inpatient Diabetes Management (Signed)
Inpatient Diabetes Program Recommendations  AACE/ADA: New Consensus Statement on Inpatient Glycemic Control (2015)  Target Ranges:  Prepandial:   less than 140 mg/dL      Peak postprandial:   less than 180 mg/dL (1-2 hours)      Critically ill patients:  140 - 180 mg/dL   Lab Results  Component Value Date   GLUCAP 256 (H) 11/21/2022   HGBA1C 9.5 (H) 11/19/2022    Review of Glycemic Control  Latest Reference Range & Units 11/20/22 07:44 11/20/22 11:26 11/20/22 13:01 11/20/22 16:04 11/20/22 21:23 11/21/22 08:05  Glucose-Capillary 70 - 99 mg/dL 460 (H)  Novolog 24 units 190 (H)  Novolog 7 units 173 (H) 291 (H)  Novolog 12 units 242 (H)  Novolog 2 units 256 (H)  Novolog 8 units    Diabetes history: Type 2 DM Outpatient Diabetes medications: Glipizide 15 mg BID Current orders for Inpatient glycemic control:  Novolog 4 units TID, Novolog 0-15 units TID & HS  Solumedrol 40 mg Q12  Inpatient Diabetes Program Recommendations:    -  Consider Levemir 8 units    At discharge: Recommend changing oral agent given GFR. Tradjenta is cleared fecally.   Thanks, Tama Headings RN, MSN, BC-ADM Inpatient Diabetes Coordinator Team Pager [phone] (8a-5p)

## 2022-11-21 NOTE — Anesthesia Preprocedure Evaluation (Signed)
Anesthesia Evaluation  Patient identified by MRN, date of birth, ID band Patient awake    Reviewed: Allergy & Precautions, H&P , NPO status , Patient's Chart, lab work & pertinent test results  History of Anesthesia Complications (+) PONV and history of anesthetic complications  Airway Mallampati: III  TM Distance: >3 FB Neck ROM: Full  Mouth opening: Limited Mouth Opening  Dental  (+) Dental Advisory Given, Missing   Pulmonary shortness of breath, with exertion and Frater-Term Oxygen Therapy, sleep apnea and Oxygen sleep apnea , pneumonia   breath sounds clear to auscultation + decreased breath sounds      Cardiovascular Exercise Tolerance: Poor hypertension, Pt. on medications +CHF  Normal cardiovascular exam Rhythm:Regular Rate:Normal  1. Left ventricular ejection fraction, by estimation, is 70 to 75%. The  left ventricle has hyperdynamic function. The left ventricle has no  regional wall motion abnormalities. There is mild left ventricular  hypertrophy. Left ventricular diastolic  parameters are consistent with Grade I diastolic dysfunction (impaired  relaxation). Elevated left atrial pressure.   2. Right ventricular systolic function is normal. The right ventricular  size is normal. Tricuspid regurgitation signal is inadequate for assessing  PA pressure.   3. The mitral valve is normal in structure. No evidence of mitral valve  regurgitation. No evidence of mitral stenosis.   4. The tricuspid valve is abnormal.   5. The aortic valve is tricuspid. Aortic valve regurgitation is not  visualized. Mild aortic valve stenosis. Aortic valve mean gradient  measures 12.0 mmHg. Aortic valve peak gradient measures 27.1 mmHg. Aortic  valve area, by VTI measures 2.38 cm.   6. The inferior vena cava is dilated in size with >50% respiratory  variability, suggesting right atrial pressure of 8 mmHg.      Neuro/Psych negative  neurological ROS  negative psych ROS   GI/Hepatic Neg liver ROS,GERD  Medicated and Poorly Controlled,,  Endo/Other  diabetes, Well Controlled, Type 2, Insulin Dependent, Oral Hypoglycemic Agents    Renal/GU ESRF and DialysisRenal disease  negative genitourinary   Musculoskeletal  (+) Arthritis ,    Abdominal   Peds negative pediatric ROS (+)  Hematology  (+) Blood dyscrasia, anemia   Anesthesia Other Findings 19-Nov-2022 03:44:18 [facility]-AP-ER ROUTINE RECORD [id number] (68 yr) Female Black Room:ER19 Vent. rate 79 BPM PR interval 164 ms QRS duration 109 ms QT/QTcB 410/470 ms P-R-T axes 71 76 65 Sinus rhythm Incomplete left bundle branch block No significant change since last tracing Confirmed by Wandra Arthurs [phone]) on 11/20/2022 11:52:51 PM  Reproductive/Obstetrics negative OB ROS                             Anesthesia Physical Anesthesia Plan  ASA: 4  Anesthesia Plan: General   Post-op Pain Management: Minimal or no pain anticipated   Induction: Intravenous  PONV Risk Score and Plan: TIVA  Airway Management Planned: Nasal Cannula and Natural Airway  Additional Equipment:   Intra-op Plan:   Post-operative Plan: Possible Post-op intubation/ventilation  Informed Consent: I have reviewed the patients History and Physical, chart, labs and discussed the procedure including the risks, benefits and alternatives for the proposed anesthesia with the patient or authorized representative who has indicated his/her understanding and acceptance.     Dental advisory given  Plan Discussed with: CRNA and Surgeon  Anesthesia Plan Comments:        Anesthesia Quick Evaluation

## 2022-11-21 NOTE — Progress Notes (Signed)
We will proceed with EGD as scheduled.  I thoroughly discussed with the patient the procedure, including the risks involved. Patient understands what the procedure involves including the benefits and any risks. Patient understands alternatives to the proposed procedure. Risks including (but not limited to) bleeding, tearing of the lining (perforation), rupture of adjacent organs, problems with heart and lung function, infection, and medication reactions. A small percentage of complications may require surgery, hospitalization, repeat endoscopic procedure, and/or transfusion.  Patient understood and agreed.  Maylon Peppers, MD Gastroenterology and Hepatology [facility]

## 2022-11-21 NOTE — Progress Notes (Signed)
[facility] Progress Note   Assessment/ Plan:   ESRD MWF LUE AVF [facility]        Urgent HD 11/18/22, off schedule, then 11/20/22 Next HD 11/22/22 2K 3.5h, 2-3L UF goal, use AVF 2.  HTN emergency with AHRF: getting better- off nitro gtt and PO minoxidil 3.  Anemia:Hb 7.9 was 8.5 at admit, CTM; outpt mgmt unclear 4.  CKDBMD: cont outpt mgmt:  5.  OSA 6.  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  HD yesterday, had prolonged bleeding from arterial.  No hep in HD.     Objective:   BP (!) 144/61   Pulse 87   Temp 98 F (36.7 C) (Oral)   Resp (!) 22   Ht 5\' 3"  (1.6 m)   Wt 113.2 kg   SpO2 96%   BMI 44.21 kg/m   Physical Exam: Gen:NAD, lying in bed, NAD CVS: RRR Resp: clear Abd: soft Ext: no LE edema ACCESS: L AVF + T/B  Labs: BMET Recent Labs  Lab 11/18/22 1433 11/19/22 0526 11/21/22 0427  NA 135 138 135  K 4.4 3.4* 4.6  CL 99 101 95*  CO2 19* 24 26  GLUCOSE 427* 109* 272*  BUN 87* 68* 60*  CREATININE 13.04* 11.14* 10.03*  CALCIUM 8.7* 8.9 8.6*   CBC Recent Labs  Lab 11/18/22 1433 11/19/22 0526 11/21/22 0427  WBC 15.5* 15.4* 15.2*  NEUTROABS 13.0*  --  14.1*  HGB 8.5* 7.9* 8.2*  HCT 27.3* 25.7* 25.8*  MCV 91.3 90.5 90.2  PLT 232 253 231      Medications:     aspirin EC  81 mg Oral Daily   azithromycin  250 mg Oral Q M,W,F   Chlorhexidine Gluconate Cloth  6 each Topical Q0600   cinacalcet  30 mg Oral Q breakfast   insulin aspart  0-15 Units Subcutaneous TID WC   insulin aspart  0-5 Units Subcutaneous QHS   insulin aspart  4 Units Subcutaneous TID WC   labetalol  200 mg Oral BID   linaclotide  290 mcg Oral QAC breakfast   methylPREDNISolone (SOLU-MEDROL) injection  40 mg Intravenous Q12H   minoxidil  5 mg Oral BID   mometasone-formoterol  2 puff Inhalation BID   pantoprazole (PROTONIX) IV  40 mg Intravenous Q12H   rOPINIRole  1 mg Oral BID   rosuvastatin  10 mg Oral Daily     Madelon Lips MD 11/21/2022, 9:55 AM

## 2022-11-21 NOTE — Progress Notes (Signed)
SLP Cancellation Note  Patient Details Name: Dhalia Zingaro MRN: [id number] DOB: [birth year]   Cancelled treatment:       Reason Eval/Treat Not Completed: Medical issues which prohibited therapy (Pt is NPO pending EGD later today. SLP will check back.)  Thank you,  Genene Churn, [place] 11/21/2022, 11:34 AM

## 2022-11-21 NOTE — Progress Notes (Signed)
Speech Language Pathology Treatment: Dysphagia  Patient Details Name: Patricia [name] MRN: [id number] DOB: June 15, 1971 Today's Date: 11/21/2022 Time: 0973-5329 SLP Time Calculation (min) (ACUTE ONLY): 15 min  Assessment / Plan / Recommendation Clinical Impression  Ongoing diagnostic dysphagia treatment provided today; Pt consumed regular textures and thin liquids again without overt s/sx of aspiration. RN did report some poor positioning with PO observed yesterday which could questionably relate to Pt's "choking episodes" if she is eating/drinking when reclined. Reviewed safe swallowing positioning and universal aspiration precautions. There are no further ST needs noted at this time, ST will sign off. 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  The patient states that she has been compliant with dialysis as well as her fluid intake restrictions.  She has been staying the entire time for dialysis sessions.  Her last dialysis was on 11/16/2022.  She denies any fevers, chills, headache, nausea, vomiting, diarrhea.  She had been having some chest pain.  She has a nonproductive cough but denies any hemoptysis.  She has been having some melanotic stools without any hematochezia.  The patient saw Dr. Halford Chessman on 10/22/2022 for dyspnea.  She was placed on trial of azithromycin and prednisone taper.  She states that has not helped a whole lot. In the ED she is afebrile, tachypneic, hypoxic requiring BiPAP placement.  Blood pressure is in the 924Q-683M systolic.  Chest x-ray shows pulmonary edema.  Blood work reveals LFT elevation with ALT 122 and AST 43, high-sensitivity troponin 62 >> 59, hemoglobin 8.5 and a white count of 15.5.  Nephrology was consulted,  she was placed on a nitroglycerin infusion due to elevated blood pressure and we are asked to admit. Pt now down to 4L O2. BSE requested.      SLP Plan  All goals met;Discharge SLP treatment due to (comment)      Recommendations for follow up therapy are one component of a multi-disciplinary discharge planning process, led by the attending physician.  Recommendations may be updated based on patient status, additional functional criteria and insurance authorization.    Recommendations  Diet recommendations: Regular;Thin liquid Liquids provided via: Cup;Straw Medication Administration: Whole meds with liquid Supervision: Patient able to self feed Compensations: Minimize environmental distractions;Small sips/bites Postural Changes and/or Swallow Maneuvers: Seated upright 90 degrees                Plan: All goals met;Discharge SLP treatment due to (comment)         Marvene Strohm H. Roddie Mc, CCC-SLP Speech Language Pathologist   Wende Bushy  11/21/2022, 4:45 PM

## 2022-11-21 NOTE — Transfer of Care (Signed)
Immediate Anesthesia Transfer of Care Note  Patient: Patricia [name]  Procedure(s) Performed: ESOPHAGOGASTRODUODENOSCOPY (EGD) WITH PROPOFOL  Patient Location: PACU  Anesthesia Type:General  Level of Consciousness: awake, alert , oriented, and patient cooperative  Airway & Oxygen Therapy: Patient Spontanous Breathing and Patient connected to face mask oxygen  Post-op Assessment: Report given to RN, Post -op Vital signs reviewed and stable, and Patient moving all extremities X 4  Post vital signs: Reviewed and stable  Last Vitals:  Vitals Value Taken Time  BP 97/44 11/21/22 1546  Temp    Pulse 71 11/21/22 1548  Resp 22 11/21/22 1548  SpO2 100 % 11/21/22 1548  Vitals shown include unvalidated device data.  Last Pain:  Vitals:   11/21/22 1526  TempSrc:   PainSc: 0-No pain      Patients Stated Pain Goal: 0 (76/72/09 4709)  Complications: No notable events documented.

## 2022-11-21 NOTE — Progress Notes (Signed)
Briefly saw patient this morning. Vital signs remain stable, hgb 8.2. She denies any shortness of breath. It was discussed that EGD could possibly be done today if clinically stable which she appears to be. Indications, risks and benefits of procedure discussed in detail with patient. Patient verbalized understanding and is in agreement to proceed with EGD at this time.

## 2022-11-22 ENCOUNTER — Telehealth: Payer: Self-pay | Admitting: Gastroenterology

## 2022-11-22 DIAGNOSIS — N186 End stage renal disease: Secondary | ICD-10-CM | POA: Diagnosis not present

## 2022-11-22 DIAGNOSIS — J8489 Other specified interstitial pulmonary diseases: Secondary | ICD-10-CM | POA: Diagnosis not present

## 2022-11-22 DIAGNOSIS — J810 Acute pulmonary edema: Secondary | ICD-10-CM | POA: Diagnosis not present

## 2022-11-22 LAB — GLUCOSE, CAPILLARY
Glucose-Capillary: 283 mg/dL — ABNORMAL HIGH (ref 70–99)
Glucose-Capillary: 278 mg/dL — ABNORMAL HIGH (ref 70–99)
Glucose-Capillary: 295 mg/dL — ABNORMAL HIGH (ref 70–99)
Glucose-Capillary: 358 mg/dL — ABNORMAL HIGH (ref 70–99)
Glucose-Capillary: 198 mg/dL — ABNORMAL HIGH (ref 70–99)

## 2022-11-22 NOTE — Procedures (Signed)
   HEMODIALYSIS TREATMENT NOTE:   Received Labetalol today prior to HD.  SBPs low 100s  prior to starting.  UF was limited by persistent hypotension (asymptomatic). 3.5 hour treatment completed.  No issues with AVF.  Net UF 700 ml.  All blood was returned and hemostasis was achieved in 20 minutes.  Rockwell Alexandria, RN

## 2022-11-22 NOTE — Telephone Encounter (Signed)
Please arrange for hospital follow up in 3-4 weeks with Vicente Males or Dr. Gala Romney

## 2022-11-22 NOTE — Progress Notes (Signed)
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  The patient states that she has been compliant with dialysis as well as her fluid intake restrictions.  She has been staying the entire time for dialysis sessions.  Her last dialysis was on 11/16/2022.  She denies any fevers, chills, headache, nausea, vomiting, diarrhea.  She had been having some chest pain.  She has a nonproductive cough but denies any hemoptysis.  She has been having some melanotic stools without any hematochezia. The patient saw Dr. Halford Chessman on 10/22/2022 for dyspnea.  She was placed on trial of azithromycin and prednisone taper.  She states that has not helped a whole lot.   ED Course: In the ED she is afebrile, tachypneic, hypoxic requiring BiPAP placement.  Blood pressure is in the 947M-546T systolic.  Chest x-ray shows pulmonary edema.  Blood work reveals LFT elevation with ALT 122 and AST 43, high-sensitivity troponin 62 >> 59, hemoglobin 8.5 and a white count of 15.5.  Nephrology was consulted, she was placed on a nitroglycerin infusion due to elevated blood pressure and we are asked to admit    Assessment/Plan: Acute on chronic respiratory failure with hypoxia -Likely multifactorial including pulmonary edema and related to her obliterans bronchiolitis -Initially on BiPAP>>now on 2L demonstrating good oxygen saturation. -Appreciate nephrology and pulmonary follow-up -12/4 Echo EF 70-75%, no WMA, G1DD, normal RVF -Check COVID PCR--neg -Continue IV Solu-Medrol and bronchodilator management; anticipating a slow tapering at time of discharge.   ILD with  obliterative bronchiolitis after viral respiratory infection in May 2022  -pulmonary consult appreciated -continue IV steroids -continue BDs and maintain saturation above 95% as per pulmonology recommendation. -check PCT 1.12 -10/29/2022 CT chest high-res>>GGO with interlobular septal thickening suggestive of edema.  New peripheral predominant areas of nodular consolidative changes-possible UIP -Plan is to follow-up with pulmonology as an outpatient after discharge.   ESRD/fluid overload -Dialyzed in the evening 11/18/2022 -HD last done on 12/5 -Appreciate nephrology assistance and recommendations; next treatment later today 11/22/2022 (currently off cycle).   Hypertensive urgency -Started on nitroglycerin drip>>weaned off -Restart home antihypertensive medication and wean off of nitroglycerin -Restarted labetalol>>dose increased -nifedipine stopped/discontinue; continue minoxidil as recommended by pulmonology. -Blood pressure appears to be stable today.   Chest pain/elevated troponin -Troponins 62>> 59 -Secondary to demand ischemia -Echo EF 70-75%, no WMA, G1DD, normal RVF -VQ scan--neg PE -Currently chest pain-free.   Blood loss anemia -Presented with hemoglobin 8.5 -Patient hemoglobin 13.2 on 03/02/2022 -iron sat 14, ferritin 950>>ferrlecit daily IV -FOBT -GI consult appreciate>> status post EGD without frank acute bleeding source appreciated.  Hemoglobin has remained stable.  Planning for outpatient colonoscopy. -Continue IV iron and Epogen as per nephrology discretion.  Uncontrolled diabetes mellitus type 2 with hyperglcemia -11/19/22 A1C--9.5 -increased to moderate SSI -add novolog 4 units with meals -Continue to hold glipizide   Transaminasemia -Suspect hepatic congestion -RUQ US--hepatic steatosis, prominent portal vein. -Continue to follow LFTs trend.   Morbid obesity -BMI 45.93 -Low calorie diet, portion control and increase physical activity discussed with  patient.   OSA-intolerant of CPAP per pulm records. -Continue oxygen supplementation.   Mixed hyperlipidemia -Continue statin -  Heart healthy diet discussed with patient.     Family Communication:   mother at bedside updated 12/4   Consultants:  renal   Code Status:  FULL    DVT Prophylaxis:  [place] Heparin      Procedures: As Listed in Progress Note Above   Antibiotics: azithro   Subjective: No further episode of vomiting; overall breathing better and denying chest pain.  Continues to have the need of 2 L nasal cannula supplementation around-the-clock to maintain saturation.  Having headaches and experiencing intermittent nausea.  Objective: Vitals:   11/22/22 1640 11/22/22 1700 11/22/22 1715 11/22/22 1730  BP: (!) 108/50 (!) 107/48 (!) 111/41 (!) 112/44  Pulse: 76 76    Resp: (!) 26 (!) 22 (!) 24 15  Temp: 98.2 F (36.8 C)     TempSrc: Oral     SpO2: 99% 92%    Weight:      Height:        Intake/Output Summary (Last 24 hours) at 11/22/2022 1805 Last data filed at 11/22/2022 0000 Gross per 24 hour  Intake --  Output 0 ml  Net 0 ml   Weight change: -1.2 kg  Exam: General exam: Alert, awake, oriented x 3; complaining of headache and feeling slightly short of breath with activity.  No further vomiting but is still having some intermittent nausea. Respiratory system: Scattered rhonchi bilaterally; decreased breath sounds at the bases.  No frank crackles.  Good oxygen saturation on 2 L supplementation. Cardiovascular system:RRR. No rubs or gallops; unable to assess JVD with body habitus. Gastrointestinal system: Abdomen is obese, nondistended, soft and nontender. No organomegaly or masses felt. Normal bowel sounds heard. Central nervous system: Alert and oriented. No focal neurological deficits. Extremities: No cyanosis or clubbing; 1+ edema appreciated bilaterally. Skin: No petechiae. Psychiatry: Judgement and insight appear normal. Mood & affect appropriate.    Data Reviewed: I have personally reviewed following labs and imaging studies  Basic Metabolic Panel: Recent Labs  Lab 11/18/22 1433 11/19/22 0526 11/21/22 0427  NA 135 138 135  K 4.4 3.4* 4.6  CL 99 101 95*  CO2 19* 24 26  GLUCOSE 427* 109* 272*  BUN 87* 68* 60*  CREATININE 13.04* 11.14* 10.03*  CALCIUM 8.7* 8.9 8.6*  MG  --  1.8  --    Liver Function Tests: Recent Labs  Lab 11/18/22 1433 11/19/22 0526 11/21/22 0427  AST 43* 34 18  ALT 122* 105* 63*  ALKPHOS 132* 108 97  BILITOT 0.7 0.7 0.2*  PROT 7.1 6.6 6.4*  ALBUMIN 4.0 3.8 3.6   CBC: Recent Labs  Lab 11/18/22 1433 11/19/22 0526 11/21/22 0427  WBC 15.5* 15.4* 15.2*  NEUTROABS 13.0*  --  14.1*  HGB 8.5* 7.9* 8.2*  HCT 27.3* 25.7* 25.8*  MCV 91.3 90.5 90.2  PLT 232 253 231   CBG: Recent Labs  Lab 11/21/22 2141 11/22/22 0302 11/22/22 0804 11/22/22 1127 11/22/22 1611  GLUCAP 323* 283* 278* 295* 358*   HbA1C: No results for input(s): "HGBA1C" in the last 72 hours.  Urine analysis:    Component Value Date/Time   COLORURINE STRAW (A) 12/22/2018 0847   APPEARANCEUR CLEAR 12/22/2018 0847   LABSPEC 1.007 12/22/2018 0847   PHURINE 9.0 (H) 12/22/2018 0847   GLUCOSEU >=500 (A) 12/22/2018 0847   HGBUR NEGATIVE 12/22/2018 [place] NEGATIVE 12/22/2018 [place] 12/22/2018 0847   PROTEINUR 100 (A) 12/22/2018 0847   NITRITE NEGATIVE 12/22/2018 0847   LEUKOCYTESUR NEGATIVE 12/22/2018  0847   Sepsis Labs:  Recent Results (from the past 240 hour(s))  MRSA Next Gen by PCR, Nasal     Status: None   Collection Time: 11/19/22  7:55 AM   Specimen: Nasal Mucosa; Nasal Swab  Result Value Ref Range Status   MRSA by PCR Next Gen NOT DETECTED NOT DETECTED Final    Comment: (NOTE) The GeneXpert MRSA Assay (FDA approved for NASAL specimens only), is one component of a comprehensive MRSA colonization surveillance program. It is not intended to diagnose MRSA infection nor to guide or  monitor treatment for MRSA infections. Test performance is not FDA approved in patients less than 47 years old. Performed at [facility], [address] [place], [place] [address]   SARS Coronavirus 2 by RT PCR (hospital order, performed in [facility] hospital lab) *cepheid single result test* Anterior Nasal Swab     Status: None   Collection Time: 11/19/22  7:55 AM   Specimen: Anterior Nasal Swab  Result Value Ref Range Status   SARS Coronavirus 2 by RT PCR NEGATIVE NEGATIVE Final    Comment: (NOTE) SARS-CoV-2 target nucleic acids are NOT DETECTED.  The SARS-CoV-2 RNA is generally detectable in upper and lower respiratory specimens during the acute phase of infection. The lowest concentration of SARS-CoV-2 viral copies this assay can detect is 250 copies / mL. A negative result does not preclude SARS-CoV-2 infection and should not be used as the sole basis for treatment or other patient management decisions.  A negative result may occur with improper specimen collection / handling, submission of specimen other than nasopharyngeal swab, presence of viral mutation(s) within the areas targeted by this assay, and inadequate number of viral copies (<250 copies / mL). A negative result must be combined with clinical observations, patient history, and epidemiological information.  Fact Sheet for Patients:   https://www.patel.info/  Fact Sheet for Healthcare Providers: https://hall.com/  This test is not yet approved or  cleared by the [place] FDA and has been authorized for detection and/or diagnosis of SARS-CoV-2 by FDA under an Emergency Use Authorization (EUA).  This EUA will remain in effect (meaning this test can be used) for the duration of the COVID-19 declaration under Section 564(b)(1) of the Act, 21 U.S.C. section 360bbb-3(b)(1), unless the authorization is terminated or revoked sooner.  Performed at [facility], [address] [place], [place] [address]      Scheduled Meds:  aspirin EC  81 mg Oral Daily   azithromycin  250 mg Oral Q M,W,F   Chlorhexidine Gluconate Cloth  6 each Topical Q0600   cinacalcet  30 mg Oral Q breakfast   insulin aspart  0-15 Units Subcutaneous TID WC   insulin aspart  0-5 Units Subcutaneous QHS   insulin aspart  4 Units Subcutaneous TID WC   labetalol  200 mg Oral BID   linaclotide  290 mcg Oral QAC breakfast   methylPREDNISolone (SOLU-MEDROL) injection  40 mg Intravenous Q12H   minoxidil  5 mg Oral BID   mometasone-formoterol  2 puff Inhalation BID   pantoprazole (PROTONIX) IV  40 mg Intravenous Q12H   rOPINIRole  1 mg Oral BID   rosuvastatin  10 mg Oral Daily   Continuous Infusions:  ferric gluconate (FERRLECIT) IVPB Stopped (11/22/22 1724)   nitroGLYCERIN Stopped (11/20/22 0845)    Procedures/Studies: NM Pulmonary Perfusion  Result Date: 11/19/2022 CLINICAL DATA:  Chest pain for 2 days, nonspecific. Shortness of breath for 2 weeks EXAM: NUCLEAR MEDICINE PERFUSION LUNG  SCAN TECHNIQUE: Perfusion images were obtained in multiple projections after intravenous injection of radiopharmaceutical. Ventilation scans intentionally deferred if perfusion scan and chest x-ray adequate for interpretation during COVID 19 epidemic. RADIOPHARMACEUTICALS:  4.17 mCi Tc-60m MAA IV COMPARISON:  Chest radiograph 11/18/2022 FINDINGS: There is a uniform distribution of the radiopharmaceutical within both lungs. No peripheral segmental perfusion defects identified bilaterally to suggest an acute pulmonary embolus. IMPRESSION: No evidence for acute pulmonary embolus. Electronically Signed   By: Kerby Moors M.D.   On: 11/19/2022 13:39   ECHOCARDIOGRAM COMPLETE  Result Date: 11/19/2022    ECHOCARDIOGRAM REPORT   Patient Name:   Patricia [name] Date of Exam: 11/19/2022 Medical Rec #:  [id number]   Height:       63.0 in Accession #:    [id number]  Weight:       259.3 lb Date of Birth:  July 07, 1971  BSA:           2.159 m Patient Age:    58 years    BP:           169/46 mmHg Patient Gender: F           HR:           78 bpm. Exam Location:  [facility] Procedure: 2D Echo, Cardiac Doppler and Color Doppler Indications:    Chest Pain  History:        Patient has prior history of Echocardiogram examinations, most                 recent 06/27/2021. CHF, Signs/Symptoms:Chest Pain, Shortness of                 Breath and Dyspnea; Risk Factors:Hypertension and Diabetes.                 Breast CA.  Sonographer:    Wenda Low Referring Phys: [phone] DAVID TAT  Sonographer Comments: Patient is obese. IMPRESSIONS  1. Left ventricular ejection fraction, by estimation, is 70 to 75%. The left ventricle has hyperdynamic function. The left ventricle has no regional wall motion abnormalities. There is mild left ventricular hypertrophy. Left ventricular diastolic parameters are consistent with Grade I diastolic dysfunction (impaired relaxation). Elevated left atrial pressure.  2. Right ventricular systolic function is normal. The right ventricular size is normal. Tricuspid regurgitation signal is inadequate for assessing PA pressure.  3. The mitral valve is normal in structure. No evidence of mitral valve regurgitation. No evidence of mitral stenosis.  4. The tricuspid valve is abnormal.  5. The aortic valve is tricuspid. Aortic valve regurgitation is not visualized. Mild aortic valve stenosis. Aortic valve mean gradient measures 12.0 mmHg. Aortic valve peak gradient measures 27.1 mmHg. Aortic valve area, by VTI measures 2.38 cm.  6. The inferior vena cava is dilated in size with >50% respiratory variability, suggesting right atrial pressure of 8 mmHg. FINDINGS  Left Ventricle: Left ventricular ejection fraction, by estimation, is 70 to 75%. The left ventricle has hyperdynamic function. The left ventricle has no regional wall motion abnormalities. The left ventricular internal cavity size was normal in size. There is mild left  ventricular hypertrophy. Left ventricular diastolic parameters are consistent with Grade I diastolic dysfunction (impaired relaxation). Elevated left atrial pressure. Right Ventricle: The right ventricular size is normal. Right vetricular wall thickness was not well visualized. Right ventricular systolic function is normal. Tricuspid regurgitation signal is inadequate for assessing PA pressure. Left Atrium: Left atrial size was normal in size. Right Atrium: Right atrial  size was normal in size. Pericardium: There is no evidence of pericardial effusion. Mitral Valve: The mitral valve is normal in structure. There is mild thickening of the mitral valve leaflet(s). There is mild calcification of the mitral valve leaflet(s). Mild mitral annular calcification. No evidence of mitral valve regurgitation. No evidence of mitral valve stenosis. MV peak gradient, 9.5 mmHg. The mean mitral valve gradient is 4.0 mmHg. Tricuspid Valve: The tricuspid valve is abnormal. Tricuspid valve regurgitation is mild . No evidence of tricuspid stenosis. Aortic Valve: The aortic valve is tricuspid. Aortic valve regurgitation is not visualized. Mild aortic stenosis is present. Aortic valve mean gradient measures 12.0 mmHg. Aortic valve peak gradient measures 27.1 mmHg. Aortic valve area, by VTI measures 2.38 cm. Pulmonic Valve: The pulmonic valve was not well visualized. Pulmonic valve regurgitation is not visualized. No evidence of pulmonic stenosis. Aorta: The aortic root is normal in size and structure. Venous: The inferior vena cava is dilated in size with greater than 50% respiratory variability, suggesting right atrial pressure of 8 mmHg. IAS/Shunts: No atrial level shunt detected by color flow Doppler.  LEFT VENTRICLE PLAX 2D LVIDd:         4.40 cm   Diastology LVIDs:         2.50 cm   LV e' medial:    6.96 cm/s LV PW:         1.10 cm   LV E/e' medial:  16.5 LV IVS:        1.10 cm   LV e' lateral:   6.74 cm/s LVOT diam:     2.00 cm    LV E/e' lateral: 17.1 LV SV:         116 LV SV Index:   54 LVOT Area:     3.14 cm  RIGHT VENTRICLE RV Basal diam:  4.05 cm RV Mid diam:    3.30 cm RV S prime:     16.80 cm/s TAPSE (M-mode): 3.1 cm LEFT ATRIUM              Index        RIGHT ATRIUM           Index LA diam:        4.70 cm  2.18 cm/m   RA Area:     23.50 cm LA Vol (A2C):   103.0 ml 47.70 ml/m  RA Volume:   72.80 ml  33.72 ml/m LA Vol (A4C):   71.4 ml  33.07 ml/m LA Biplane Vol: 87.1 ml  40.34 ml/m  AORTIC VALVE AV Area (Vmax):    1.95 cm AV Area (Vmean):   1.88 cm AV Area (VTI):     2.38 cm AV Vmax:           260.50 cm/s AV Vmean:          158.500 cm/s AV VTI:            0.489 m AV Peak Grad:      27.1 mmHg AV Mean Grad:      12.0 mmHg LVOT Vmax:         162.00 cm/s LVOT Vmean:        95.100 cm/s LVOT VTI:          0.370 m LVOT/AV VTI ratio: 0.76  AORTA Ao Root diam: 3.00 cm MITRAL VALVE MV Area (PHT): 2.81 cm     SHUNTS MV Area VTI:   2.79 cm     Systemic VTI:  0.37 m MV  Peak grad:  9.5 mmHg     Systemic Diam: 2.00 cm MV Mean grad:  4.0 mmHg MV Vmax:       1.54 m/s MV Vmean:      97.9 cm/s MV Decel Time: 270 msec MV E velocity: 115.00 cm/s MV A velocity: 142.00 cm/s MV E/A ratio:  0.81 Carlyle Dolly MD Electronically signed by Carlyle Dolly MD Signature Date/Time: 11/19/2022/12:03:01 PM    Final    US Abdomen Limited RUQ (LIVER/GB)  Result Date: 11/19/2022 CLINICAL DATA:  Elevated LFTs EXAM: ULTRASOUND ABDOMEN LIMITED RIGHT UPPER QUADRANT COMPARISON:  No prior abdomen ultrasound available, correlation is made with CT abdomen pelvis 07/01/2021 FINDINGS: Gallbladder: Status post cholecystectomy. Common bile duct: Diameter: 5 mm, within normal limits. No intrahepatic biliary ductal dilatation. Liver: No focal lesion identified. Increased parenchymal echogenicity. Portal vein is patent on color Doppler imaging with normal direction of blood flow towards the liver, although the portal vein and hepatic vein appears somewhat prominent.  Other: None. IMPRESSION: 1. Hepatic steatosis. 2. Portal vein and hepatic vein appear somewhat prominent, although there is normal direction of blood flow toward the liver in the portal vein. Electronically Signed   By: Merilyn Baba M.D.   On: 11/19/2022 11:14   DG Chest 2 View  Result Date: 11/18/2022 CLINICAL DATA:  Shortness of breath EXAM: CHEST - 2 VIEW COMPARISON:  10/29/2022 FINDINGS: Moderate cardiomegaly with pulmonary vascular congestion. Diffuse bilateral interstitial and alveolar opacities. No large pleural fluid collection. No pneumothorax. IMPRESSION: Appearance suggests CHF with pulmonary edema. Electronically Signed   By: Davina Poke D.O.   On: 11/18/2022 13:48   CT Chest High Resolution  Result Date: 10/30/2022 CLINICAL DATA:  51 year old female with history of obliterative bronchiolitis. EXAM: CT CHEST WITHOUT CONTRAST TECHNIQUE: Multidetector CT imaging of the chest was performed following the standard protocol without intravenous contrast. High resolution imaging of the lungs, as well as inspiratory and expiratory imaging, was performed. RADIATION DOSE REDUCTION: This exam was performed according to the departmental dose-optimization program which includes automated exposure control, adjustment of the mA and/or kV according to patient size and/or use of iterative reconstruction technique. COMPARISON:  High-resolution chest CT 10/10/2021. FINDINGS: Cardiovascular: Heart size is mildly enlarged. There is no significant pericardial fluid, thickening or pericardial calcification. There is aortic atherosclerosis, as well as atherosclerosis of the great vessels of the mediastinum and the coronary arteries, including calcified atherosclerotic plaque in the left main and left anterior descending coronary arteries. Mediastinum/Nodes: Enlarged lymph node or conglomeration of lymph nodes in the subcarinal nodal station (axial image 56 of series 3) measuring 2.2 cm in short axis, similar to  the prior examination. No other pathologically enlarged mediastinal or hilar lymph nodes are noted on today's noncontrast CT examination. Esophagus is unremarkable in appearance. No axillary lymphadenopathy. Lungs/Pleura: High-resolution images demonstrate a background of very mild diffuse ground-glass attenuation and interlobular septal thickening, which is nonspecific, but can be seen in the setting of mild interstitial edema. Inspiratory and expiratory imaging demonstrates some very mild air trapping, suggesting mild small airways disease. Some patchy peripheral predominant areas of nodular airspace consolidation are also noted scattered throughout the lungs bilaterally, some of which are in association with some mild septal thickening and subpleural reticulation, most evident in the mid to lower lungs. No traction bronchiectasis or honeycombing. No pleural effusions. Upper Abdomen: Aortic atherosclerosis. Musculoskeletal: There are no aggressive appearing lytic or blastic lesions noted in the visualized portions of the skeleton. IMPRESSION: 1. Unusual appearance of the  lungs. Given the presence of cardiomegaly and the background of ground-glass attenuation and interlobular septal thickening, much of the parenchymal changes in the lungs can be attributable to mild interstitial pulmonary edema in the setting of mild congestive heart failure. However, today's study also demonstrates new peripherally predominant areas of nodular consolidative change in septal thickening. This is nonspecific, and could be indicative of interstitial lung disease, but would be categorized as most compatible with an alternative diagnosis (not usual interstitial pneumonia) per current ATS guidelines. Regions of cryptogenic organizing pneumonia (COP) are suspected. Repeat high-resolution chest CT is suggested in 12 months to assess for temporal changes in the appearance of the lung parenchyma. 2. Aortic atherosclerosis, in addition to  left main and left anterior descending coronary artery disease. Please note that although the presence of coronary artery calcium documents the presence of coronary artery disease, the severity of this disease and any potential stenosis cannot be assessed on this non-gated CT examination. Assessment for potential risk factor modification, dietary therapy or pharmacologic therapy may be warranted, if clinically indicated. Electronically Signed   By: Vinnie Langton M.D.   On: 10/30/2022 09:14    Barton Dubois, MD  [facility]  If 7PM-7AM, please contact night-coverage [email or web address] Password [email or web address] 11/22/2022, 6:05 PM   LOS: 3 days

## 2022-11-22 NOTE — Progress Notes (Signed)
Pt getting dialysis and can't get to patient at this moment RT will check back

## 2022-11-22 NOTE — Care Management Important Message (Signed)
Important Message  Patient Details  Name: Patricia [name] MRN: [id number] Date of Birth: 08/13/1971   Medicare Important Message Given:  Yes     Tommy Medal 11/22/2022, 12:10 PM

## 2022-11-22 NOTE — Inpatient Diabetes Management (Signed)
Inpatient Diabetes Program Recommendations  AACE/ADA: New Consensus Statement on Inpatient Glycemic Control (2015)  Target Ranges:  Prepandial:   less than 140 mg/dL      Peak postprandial:   less than 180 mg/dL (1-2 hours)      Critically ill patients:  140 - 180 mg/dL   Lab Results  Component Value Date   GLUCAP 278 (H) 11/22/2022   HGBA1C 9.5 (H) 11/19/2022    Review of Glycemic Control  Latest Reference Range & Units 11/20/22 07:44 11/20/22 11:26 11/20/22 13:01 11/20/22 16:04 11/20/22 21:23 11/21/22 08:05  Glucose-Capillary 70 - 99 mg/dL 460 (H)  Novolog 24 units 190 (H)  Novolog 7 units 173 (H) 291 (H)  Novolog 12 units 242 (H)  Novolog 2 units 256 (H)  Novolog 8 units    Latest Reference Range & Units 11/21/22 08:05 11/21/22 11:29 11/21/22 15:55 11/21/22 16:16 11/21/22 21:41 11/22/22 03:02 11/22/22 08:04  Glucose-Capillary 70 - 99 mg/dL 256 (H) 200 (H) 229 (H) 245 (H) 323 (H) 283 (H) 278 (H)   Diabetes history: Type 2 DM Outpatient Diabetes medications: Glipizide 15 mg BID Current orders for Inpatient glycemic control:  Novolog 4 units TID, Novolog 0-15 units TID & HS  Solumedrol 40 mg Q12  Inpatient Diabetes Program Recommendations:    -  Consider Levemir 8 units    At discharge: Recommend changing oral agent given GFR. Tradjenta is cleared fecally.   Thanks, Tama Headings RN, MSN, BC-ADM Inpatient Diabetes Coordinator Team Pager [phone] (8a-5p)

## 2022-11-22 NOTE — Progress Notes (Signed)
[facility] Progress Note   Assessment/ Plan:   ESRD MWF LUE AVF [facility]        Urgent HD 11/18/22, off schedule, then 11/20/22 Next HD 11/22/22 2K 3.5h, 2-3L UF goal, use AVF 2.  HTN emergency with AHRF: getting better- off nitro gtt and on PO minoxidil, pressures much better.  Will likely need EDW adjustment 3.  Anemia:Hb 7.9 was 8.5 at admit.  EGD yesterday without pathology. 4.  CKDBMD: cont outpt mgmt:  5.  OSA 6.  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  Dispo: possibly home after HD today  Subjective:    Seen in room.  For HD today.  Has a headache.     Objective:   BP (!) 139/46 (BP Location: Right Arm)   Pulse 83   Temp 97.6 F (36.4 C) (Oral)   Resp 17   Ht 5\' 3"  (1.6 m)   Wt 113.2 kg   SpO2 96%   BMI 44.21 kg/m   Physical Exam: Gen:NAD, lying in bed, appears slightly uncomfortable. CVS: RRR Resp: clear Abd: soft Ext: no LE edema ACCESS: L AVF + T/B  Labs: BMET Recent Labs  Lab 11/18/22 1433 11/19/22 0526 11/21/22 0427  NA 135 138 135  K 4.4 3.4* 4.6  CL 99 101 95*  CO2 19* 24 26  GLUCOSE 427* 109* 272*  BUN 87* 68* 60*  CREATININE 13.04* 11.14* 10.03*  CALCIUM 8.7* 8.9 8.6*   CBC Recent Labs  Lab 11/18/22 1433 11/19/22 0526 11/21/22 0427  WBC 15.5* 15.4* 15.2*  NEUTROABS 13.0*  --  14.1*  HGB 8.5* 7.9* 8.2*  HCT 27.3* 25.7* 25.8*  MCV 91.3 90.5 90.2  PLT 232 253 231      Medications:     aspirin EC  81 mg Oral Daily   azithromycin  250 mg Oral Q M,W,F   Chlorhexidine Gluconate Cloth  6 each Topical Q0600   cinacalcet  30 mg Oral Q breakfast   insulin aspart  0-15 Units Subcutaneous TID WC   insulin aspart  0-5 Units Subcutaneous QHS   insulin aspart  4 Units Subcutaneous TID WC   labetalol  200 mg Oral BID   linaclotide  290 mcg Oral QAC breakfast   methylPREDNISolone (SOLU-MEDROL) injection  40 mg Intravenous Q12H   minoxidil  5 mg Oral BID   mometasone-formoterol  2 puff Inhalation BID    pantoprazole (PROTONIX) IV  40 mg Intravenous Q12H   rOPINIRole  1 mg Oral BID   rosuvastatin  10 mg Oral Daily    Madelon Lips MD 11/22/2022, 8:10 AM

## 2022-11-23 DIAGNOSIS — J9621 Acute and chronic respiratory failure with hypoxia: Secondary | ICD-10-CM | POA: Diagnosis not present

## 2022-11-23 DIAGNOSIS — N186 End stage renal disease: Secondary | ICD-10-CM | POA: Diagnosis not present

## 2022-11-23 DIAGNOSIS — J810 Acute pulmonary edema: Secondary | ICD-10-CM | POA: Diagnosis not present

## 2022-11-23 DIAGNOSIS — J4481 Bronchiolitis obliterans and bronchiolitis obliterans syndrome: Secondary | ICD-10-CM | POA: Diagnosis not present

## 2022-11-23 LAB — GLUCOSE, CAPILLARY
Glucose-Capillary: 266 mg/dL — ABNORMAL HIGH (ref 70–99)
Glucose-Capillary: 313 mg/dL — ABNORMAL HIGH (ref 70–99)

## 2022-11-23 MED ORDER — ONDANSETRON 4 MG PO TBDP: 4.0000 mg | ORAL_TABLET | ORAL | 0 refills | Status: DC | PRN

## 2022-11-23 MED ORDER — PREDNISONE 10 MG PO TABS: ORAL_TABLET | ORAL | 0 refills | Status: DC

## 2022-11-23 MED ORDER — PANTOPRAZOLE SODIUM 40 MG PO TBEC: 40.0000 mg | DELAYED_RELEASE_TABLET | Freq: Two times a day (BID) | ORAL | 2 refills | Status: DC

## 2022-11-23 MED ORDER — LABETALOL HCL 100 MG PO TABS
150.0000 mg | ORAL_TABLET | Freq: Two times a day (BID) | ORAL | Status: DC
  Filled 2022-11-23 (×6): qty 1.5

## 2022-11-23 MED ORDER — PANTOPRAZOLE SODIUM 40 MG PO TBEC: 40.0000 mg | DELAYED_RELEASE_TABLET | Freq: Two times a day (BID) | ORAL | Status: DC

## 2022-11-23 MED ORDER — MINOXIDIL 2.5 MG PO TABS: 2.5000 mg | ORAL_TABLET | Freq: Two times a day (BID) | ORAL | 2 refills | Status: DC

## 2022-11-23 MED ORDER — MINOXIDIL 2.5 MG PO TABS
2.5000 mg | ORAL_TABLET | Freq: Two times a day (BID) | ORAL | Status: DC
  Filled 2022-11-23 (×6): qty 1

## 2022-11-23 MED ORDER — DARBEPOETIN ALFA 100 MCG/0.5ML IJ SOSY: 100.0000 ug | PREFILLED_SYRINGE | Freq: Once | INTRAMUSCULAR | Status: DC

## 2022-11-23 MED ORDER — LABETALOL HCL 100 MG PO TABS: 150.0000 mg | ORAL_TABLET | Freq: Two times a day (BID) | ORAL | 2 refills | Status: DC

## 2022-11-23 NOTE — Discharge Summary (Signed)
Physician Discharge Summary   Patient: Patricia [name] MRN: [id number] DOB: Feb 17, 1971  Admit date:     11/18/2022  Discharge date: 11/23/22  Discharge Physician: Barton Dubois   PCP: Jake Samples, PA-C   Recommendations at discharge:  Repeat CBC to follow hemoglobin trend/stability Close monitoring of patient's CBGs with further adjustment to hypoglycemic regimen as needed (patient might need switch of medications from oral agents to insulin initiation). Make sure patient has follow-up with pulmonology and gastroenterology as instructed. Continue to assist him with weight loss management.  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  The patient states that she has been compliant with dialysis as well as her fluid intake restrictions.  She has been staying the entire time for dialysis sessions.  Her last dialysis was on 11/16/2022.  She denies any fevers, chills, headache, nausea, vomiting, diarrhea.  She had been having some chest pain.  She has a nonproductive cough but denies any hemoptysis.  She has been having some melanotic stools without any hematochezia. The patient saw Dr. Halford Chessman on 10/22/2022 for dyspnea.  She was placed on trial of azithromycin and prednisone taper.  She states that has not helped a  whole lot.   ED Course: In the ED she is afebrile, tachypneic, hypoxic requiring BiPAP placement.  Blood pressure is in the 409W-119J systolic.  Chest x-ray shows pulmonary edema.  Blood work reveals LFT elevation with ALT 122 and AST 43, high-sensitivity troponin 62 >> 59, hemoglobin 8.5 and a white count of 15.5.  Nephrology was consulted, she was placed on a nitroglycerin infusion due to elevated blood pressure and we are asked to admit   Assessment and Plan: Acute on chronic respiratory failure with hypoxia -Likely multifactorial including pulmonary edema and related to her obliterans bronchiolitis -Initially on BiPAP>>now on 2L demonstrating good oxygen saturation. -Appreciate nephrology and pulmonology service follow-up -12/4 Echo EF 70-75%, no WMA, G1DD, normal RVF -Check COVID PCR--neg -Continue treatment with bronchodilator management and slow steroid tapering at time of discharge.   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 -check PCT 1.12 -10/29/2022 CT chest high-res>>GGO with interlobular septal thickening suggestive of edema.  New peripheral predominant areas of nodular consolidative changes-possible UIP -Plan is to follow-up with pulmonology as an outpatient after discharge.   ESRD/fluid overload -Dialyzed in the evening 11/18/2022 -HD last done on 11/22/2022 -Planning outpatient treatment (extra on 11/24/2022) with resection back to her usual schedule on 11/26/2022. -Continue outpatient follow-up with nephrology service.   Hypertensive urgency -Started on nitroglycerin drip>>weaned off -Restart home antihypertensive medication and wean off of nitroglycerin -Restarted labetalol>>dose increased -nifedipine stopped/discontinue; continue minoxidil as recommended by pulmonology. -Blood pressure appears  to be stable today.   Chest pain/elevated troponin -Troponins 62>> 59 -  Secondary to demand ischemia -Echo EF 70-75%, no WMA, G1DD, normal RVF -VQ scan--neg PE -Currently chest pain-free.   Blood loss anemia -Presented with hemoglobin 8.5 -Patient hemoglobin 13.2 on 03/02/2022 -iron sat 14, ferritin 950>>ferrlecit daily IV -FOBT -GI consult appreciate>> status post EGD without frank acute bleeding source appreciated.  Hemoglobin has remained stable.  Planning for outpatient colonoscopy. -Continue IV iron and Epogen as per nephrology discretion.   Uncontrolled diabetes mellitus type 2 with hyperglcemia -11/19/22 A1C--9.5 -Modified carbohydrate diet has been recommended -Patient having discharge back on her oral hypoglycemic agents; will need close monitoring with most likely transition to the use of insulin in the near future. -Elevated CBGs most likely also associated with the use of steroids as part of treatment for her Boop.   Transaminasemia -Suspect hepatic congestion -RUQ US--hepatic steatosis, prominent portal vein. -Continue to follow LFTs trend.   Morbid obesity -BMI 45.93 -Low calorie diet, portion control and increase physical activity discussed with patient.   OSA-intolerant of CPAP per pulm records. -Continue oxygen supplementation.   Mixed hyperlipidemia -Continue statin -Heart healthy diet discussed with patient.  Consultants: Pulmonology, gastroenterology and nephrology service. Procedures performed: See below for x-ray reports. Disposition: Home Diet recommendation: Low calorie diet, modified carbohydrates and heart healthy diet.  DISCHARGE MEDICATION: Allergies as of 11/23/2022       Reactions   Amlodipine Besylate Rash, Other (See Comments)   dizziness   Reglan [metoclopramide] Other (See Comments)   hallucinations        Medication List     STOP taking these medications    NIFEdipine 30 MG 24 hr tablet Commonly known as: ADALAT CC        TAKE these medications    acetaminophen 325 MG tablet Commonly known as: TYLENOL Take 650 mg by mouth every 6 (six) hours as needed.   albuterol (2.5 MG/3ML) 0.083% nebulizer solution Commonly known as: PROVENTIL Take 3 mLs (2.5 mg total) by nebulization every 6 (six) hours as needed for wheezing or shortness of breath.   albuterol 108 (90 Base) MCG/ACT inhaler Commonly known as: Ventolin HFA Inhale 2 puffs into the lungs every 6 (six) hours as needed for wheezing or shortness of breath.   aspirin EC 81 MG tablet Take 1 tablet (81 mg total) by mouth daily. Swallow whole.   azithromycin 250 MG tablet Commonly known as: ZITHROMAX 1 tab every Monday, Wednesday, and Friday   cetirizine 10 MG tablet Commonly known as: ZYRTEC Take 10 mg by mouth daily.   cinacalcet 60 MG tablet Commonly known as: SENSIPAR Take 30 mg by mouth daily with breakfast.   dicyclomine 10 MG capsule Commonly known as: BENTYL Take 1 capsule (10 mg total) by mouth 2 (two) times daily as needed. What changed: reasons to take this   docusate sodium 100 MG capsule Commonly known as: COLACE Take 100 mg by mouth daily.   ferric citrate 1 GM 210 MG(Fe) tablet Commonly known as: AURYXIA Take 2 tablets by mouth 2 (two) times daily with a meal. With Breakfast & with supper   glipiZIDE 10 MG tablet Commonly known as: GLUCOTROL Take 15 mg by mouth 2 (two) times daily.   ipratropium 0.03 % nasal spray Commonly known as: ATROVENT Place 2 sprays into both nostrils 2 (two) times daily.   labetalol 100 MG tablet Commonly known as: NORMODYNE Take 1.5 tablets (150 mg total) by mouth 2 (two) times daily. What changed: how much to take   linaclotide 290 MCG Caps capsule Commonly  known as: LINZESS Take 290 mcg by mouth daily before breakfast.   minoxidil 2.5 MG tablet Commonly known as: LONITEN Take 1 tablet (2.5 mg total) by mouth 2 (two) times daily.   montelukast 10 MG tablet Commonly known as:  SINGULAIR TAKE 1 TABLET BY MOUTH EVERYDAY AT BEDTIME   multivitamin Tabs tablet Take 1 tablet by mouth daily.   ondansetron 4 MG disintegrating tablet Commonly known as: ZOFRAN-ODT Take 1 tablet (4 mg total) by mouth as needed for nausea or vomiting.   pantoprazole 40 MG tablet Commonly known as: PROTONIX Take 1 tablet (40 mg total) by mouth 2 (two) times daily. 30 minutes before breakfast What changed: when to take this   predniSONE 10 MG tablet Commonly known as: DELTASONE Take 3 tablets (30 mg total) by mouth daily with breakfast for 7 days, THEN 2 tablets (20 mg total) daily with breakfast for 7 days, THEN 1 tablet (10 mg total) daily with breakfast for 28 days. Start taking on: November 23, 2022 What changed: See the new instructions.   rOPINIRole 2 MG 24 hr tablet Commonly known as: REQUIP XL Take 2 mg by mouth at bedtime.   rosuvastatin 10 MG tablet Commonly known as: CRESTOR Take 1 tablet (10 mg total) by mouth daily.   sevelamer carbonate 800 MG tablet Commonly known as: RENVELA Take 800 mg by mouth See admin instructions. Take 2 tablets (1600 mg) in the morning, take 3 tablets (2400 mg) with lunch or snack,  and take 2 tablets (1600 mg) by mouth in the evening with dinner meal.   sodium chloride 0.65 % Soln nasal spray Commonly known as: OCEAN Place 1 spray into both nostrils as needed for congestion.   Symbicort 160-4.5 MCG/ACT inhaler Generic drug: budesonide-formoterol Inhale 2 puffs into the lungs daily.   Vitamin D3 125 MCG (5000 UT) Caps TAKE 1 CAPSULE BY MOUTH EVERY DAY        Follow-up Information     Jake Samples, PA-C. Schedule an appointment as soon as possible for a visit in 10 day(s).   Specialty: Family Medicine Contact information: [address] [place] [address] [phone]                Discharge Exam: Filed Weights   11/21/22 0507 11/21/22 1329 11/23/22 0700  Weight: 113.2 kg 113.2 kg 111.5 kg    General exam: Alert, awake, oriented x 3; complaining of headache and feeling slightly short of breath with activity.  No further vomiting but is still having some intermittent nausea. Respiratory system: Scattered rhonchi bilaterally; decreased breath sounds at the bases.  No frank crackles.  Good oxygen saturation on 2 L supplementation. Cardiovascular system:RRR. No rubs or gallops; unable to assess JVD with body habitus. Gastrointestinal system: Abdomen is obese, nondistended, soft and nontender. No organomegaly or masses felt. Normal bowel sounds heard. Central nervous system: Alert and oriented. No focal neurological deficits. Extremities: No cyanosis or clubbing; trace to 1+ edema appreciated bilaterally. Skin: No petechiae. Psychiatry: Judgement and insight appear normal. Mood & affect appropriate.   Condition at discharge: Stable and improved.  The results of significant diagnostics from this hospitalization (including imaging, microbiology, ancillary and laboratory) are listed below for reference.   Imaging Studies: NM Pulmonary Perfusion  Result Date: 11/19/2022 CLINICAL DATA:  Chest pain for 2 days, nonspecific. Shortness of breath for 2 weeks EXAM: NUCLEAR MEDICINE PERFUSION LUNG SCAN TECHNIQUE: Perfusion images were obtained in multiple projections after intravenous injection of radiopharmaceutical. Ventilation scans intentionally  deferred if perfusion scan and chest x-ray adequate for interpretation during COVID 19 epidemic. RADIOPHARMACEUTICALS:  4.17 mCi Tc-79m MAA IV COMPARISON:  Chest radiograph 11/18/2022 FINDINGS: There is a uniform distribution of the radiopharmaceutical within both lungs. No peripheral segmental perfusion defects identified bilaterally to suggest an acute pulmonary embolus. IMPRESSION: No evidence for acute pulmonary embolus. Electronically Signed   By: Kerby Moors M.D.   On: 11/19/2022 13:39   ECHOCARDIOGRAM COMPLETE  Result Date: 11/19/2022     ECHOCARDIOGRAM REPORT   Patient Name:   [facility] Date of Exam: 11/19/2022 Medical Rec #:  [id number]   Height:       63.0 in Accession #:    [id number]  Weight:       259.3 lb Date of Birth:  1971-11-12  BSA:          2.159 m Patient Age:    51 years    BP:           169/46 mmHg Patient Gender: F           HR:           78 bpm. Exam Location:  [facility] Procedure: 2D Echo, Cardiac Doppler and Color Doppler Indications:    Chest Pain  History:        Patient has prior history of Echocardiogram examinations, most                 recent 06/27/2021. CHF, Signs/Symptoms:Chest Pain, Shortness of                 Breath and Dyspnea; Risk Factors:Hypertension and Diabetes.                 Breast CA.  Sonographer:    Wenda Low Referring Phys: [id number] DAVID TAT  Sonographer Comments: Patient is obese. IMPRESSIONS  1. Left ventricular ejection fraction, by estimation, is 70 to 75%. The left ventricle has hyperdynamic function. The left ventricle has no regional wall motion abnormalities. There is mild left ventricular hypertrophy. Left ventricular diastolic parameters are consistent with Grade I diastolic dysfunction (impaired relaxation). Elevated left atrial pressure.  2. Right ventricular systolic function is normal. The right ventricular size is normal. Tricuspid regurgitation signal is inadequate for assessing PA pressure.  3. The mitral valve is normal in structure. No evidence of mitral valve regurgitation. No evidence of mitral stenosis.  4. The tricuspid valve is abnormal.  5. The aortic valve is tricuspid. Aortic valve regurgitation is not visualized. Mild aortic valve stenosis. Aortic valve mean gradient measures 12.0 mmHg. Aortic valve peak gradient measures 27.1 mmHg. Aortic valve area, by VTI measures 2.38 cm.  6. The inferior vena cava is dilated in size with >50% respiratory variability, suggesting right atrial pressure of 8 mmHg. FINDINGS  Left Ventricle: Left ventricular ejection fraction, by  estimation, is 70 to 75%. The left ventricle has hyperdynamic function. The left ventricle has no regional wall motion abnormalities. The left ventricular internal cavity size was normal in size. There is mild left ventricular hypertrophy. Left ventricular diastolic parameters are consistent with Grade I diastolic dysfunction (impaired relaxation). Elevated left atrial pressure. Right Ventricle: The right ventricular size is normal. Right vetricular wall thickness was not well visualized. Right ventricular systolic function is normal. Tricuspid regurgitation signal is inadequate for assessing PA pressure. Left Atrium: Left atrial size was normal in size. Right Atrium: Right atrial size was normal in size. Pericardium: There is no evidence of pericardial effusion. Mitral Valve: The mitral  valve is normal in structure. There is mild thickening of the mitral valve leaflet(s). There is mild calcification of the mitral valve leaflet(s). Mild mitral annular calcification. No evidence of mitral valve regurgitation. No evidence of mitral valve stenosis. MV peak gradient, 9.5 mmHg. The mean mitral valve gradient is 4.0 mmHg. Tricuspid Valve: The tricuspid valve is abnormal. Tricuspid valve regurgitation is mild . No evidence of tricuspid stenosis. Aortic Valve: The aortic valve is tricuspid. Aortic valve regurgitation is not visualized. Mild aortic stenosis is present. Aortic valve mean gradient measures 12.0 mmHg. Aortic valve peak gradient measures 27.1 mmHg. Aortic valve area, by VTI measures 2.38 cm. Pulmonic Valve: The pulmonic valve was not well visualized. Pulmonic valve regurgitation is not visualized. No evidence of pulmonic stenosis. Aorta: The aortic root is normal in size and structure. Venous: The inferior vena cava is dilated in size with greater than 50% respiratory variability, suggesting right atrial pressure of 8 mmHg. IAS/Shunts: No atrial level shunt detected by color flow Doppler.  LEFT VENTRICLE PLAX  2D LVIDd:         4.40 cm   Diastology LVIDs:         2.50 cm   LV e' medial:    6.96 cm/s LV PW:         1.10 cm   LV E/e' medial:  16.5 LV IVS:        1.10 cm   LV e' lateral:   6.74 cm/s LVOT diam:     2.00 cm   LV E/e' lateral: 17.1 LV SV:         116 LV SV Index:   54 LVOT Area:     3.14 cm  RIGHT VENTRICLE RV Basal diam:  4.05 cm RV Mid diam:    3.30 cm RV S prime:     16.80 cm/s TAPSE (M-mode): 3.1 cm LEFT ATRIUM              Index        RIGHT ATRIUM           Index LA diam:        4.70 cm  2.18 cm/m   RA Area:     23.50 cm LA Vol (A2C):   103.0 ml 47.70 ml/m  RA Volume:   72.80 ml  33.72 ml/m LA Vol (A4C):   71.4 ml  33.07 ml/m LA Biplane Vol: 87.1 ml  40.34 ml/m  AORTIC VALVE AV Area (Vmax):    1.95 cm AV Area (Vmean):   1.88 cm AV Area (VTI):     2.38 cm AV Vmax:           260.50 cm/s AV Vmean:          158.500 cm/s AV VTI:            0.489 m AV Peak Grad:      27.1 mmHg AV Mean Grad:      12.0 mmHg LVOT Vmax:         162.00 cm/s LVOT Vmean:        95.100 cm/s LVOT VTI:          0.370 m LVOT/AV VTI ratio: 0.76  AORTA Ao Root diam: 3.00 cm MITRAL VALVE MV Area (PHT): 2.81 cm     SHUNTS MV Area VTI:   2.79 cm     Systemic VTI:  0.37 m MV Peak grad:  9.5 mmHg     Systemic Diam: 2.00 cm MV Mean grad:  4.0 mmHg MV Vmax:       1.54 m/s MV Vmean:      97.9 cm/s MV Decel Time: 270 msec MV E velocity: 115.00 cm/s MV A velocity: 142.00 cm/s MV E/A ratio:  0.81 Carlyle Dolly MD Electronically signed by Carlyle Dolly MD Signature Date/Time: 11/19/2022/12:03:01 PM    Final    US Abdomen Limited RUQ (LIVER/GB)  Result Date: 11/19/2022 CLINICAL DATA:  Elevated LFTs EXAM: ULTRASOUND ABDOMEN LIMITED RIGHT UPPER QUADRANT COMPARISON:  No prior abdomen ultrasound available, correlation is made with CT abdomen pelvis 07/01/2021 FINDINGS: Gallbladder: Status post cholecystectomy. Common bile duct: Diameter: 5 mm, within normal limits. No intrahepatic biliary ductal dilatation. Liver: No focal lesion  identified. Increased parenchymal echogenicity. Portal vein is patent on color Doppler imaging with normal direction of blood flow towards the liver, although the portal vein and hepatic vein appears somewhat prominent. Other: None. IMPRESSION: 1. Hepatic steatosis. 2. Portal vein and hepatic vein appear somewhat prominent, although there is normal direction of blood flow toward the liver in the portal vein. Electronically Signed   By: Merilyn Baba M.D.   On: 11/19/2022 11:14   DG Chest 2 View  Result Date: 11/18/2022 CLINICAL DATA:  Shortness of breath EXAM: CHEST - 2 VIEW COMPARISON:  10/29/2022 FINDINGS: Moderate cardiomegaly with pulmonary vascular congestion. Diffuse bilateral interstitial and alveolar opacities. No large pleural fluid collection. No pneumothorax. IMPRESSION: Appearance suggests CHF with pulmonary edema. Electronically Signed   By: Davina Poke D.O.   On: 11/18/2022 13:48   CT Chest High Resolution  Result Date: 10/30/2022 CLINICAL DATA:  51 year old female with history of obliterative bronchiolitis. EXAM: CT CHEST WITHOUT CONTRAST TECHNIQUE: Multidetector CT imaging of the chest was performed following the standard protocol without intravenous contrast. High resolution imaging of the lungs, as well as inspiratory and expiratory imaging, was performed. RADIATION DOSE REDUCTION: This exam was performed according to the departmental dose-optimization program which includes automated exposure control, adjustment of the mA and/or kV according to patient size and/or use of iterative reconstruction technique. COMPARISON:  High-resolution chest CT 10/10/2021. FINDINGS: Cardiovascular: Heart size is mildly enlarged. There is no significant pericardial fluid, thickening or pericardial calcification. There is aortic atherosclerosis, as well as atherosclerosis of the great vessels of the mediastinum and the coronary arteries, including calcified atherosclerotic plaque in the left main and  left anterior descending coronary arteries. Mediastinum/Nodes: Enlarged lymph node or conglomeration of lymph nodes in the subcarinal nodal station (axial image 56 of series 3) measuring 2.2 cm in short axis, similar to the prior examination. No other pathologically enlarged mediastinal or hilar lymph nodes are noted on today's noncontrast CT examination. Esophagus is unremarkable in appearance. No axillary lymphadenopathy. Lungs/Pleura: High-resolution images demonstrate a background of very mild diffuse ground-glass attenuation and interlobular septal thickening, which is nonspecific, but can be seen in the setting of mild interstitial edema. Inspiratory and expiratory imaging demonstrates some very mild air trapping, suggesting mild small airways disease. Some patchy peripheral predominant areas of nodular airspace consolidation are also noted scattered throughout the lungs bilaterally, some of which are in association with some mild septal thickening and subpleural reticulation, most evident in the mid to lower lungs. No traction bronchiectasis or honeycombing. No pleural effusions. Upper Abdomen: Aortic atherosclerosis. Musculoskeletal: There are no aggressive appearing lytic or blastic lesions noted in the visualized portions of the skeleton. IMPRESSION: 1. Unusual appearance of the lungs. Given the presence of cardiomegaly and the background of ground-glass attenuation and interlobular septal thickening, much  of the parenchymal changes in the lungs can be attributable to mild interstitial pulmonary edema in the setting of mild congestive heart failure. However, today's study also demonstrates new peripherally predominant areas of nodular consolidative change in septal thickening. This is nonspecific, and could be indicative of interstitial lung disease, but would be categorized as most compatible with an alternative diagnosis (not usual interstitial pneumonia) per current ATS guidelines. Regions of  cryptogenic organizing pneumonia (COP) are suspected. Repeat high-resolution chest CT is suggested in 12 months to assess for temporal changes in the appearance of the lung parenchyma. 2. Aortic atherosclerosis, in addition to left main and left anterior descending coronary artery disease. Please note that although the presence of coronary artery calcium documents the presence of coronary artery disease, the severity of this disease and any potential stenosis cannot be assessed on this non-gated CT examination. Assessment for potential risk factor modification, dietary therapy or pharmacologic therapy may be warranted, if clinically indicated. Electronically Signed   By: Vinnie Langton M.D.   On: 10/30/2022 09:14    Microbiology: Results for orders placed or performed during the hospital encounter of 11/18/22  MRSA Next Gen by PCR, Nasal     Status: None   Collection Time: 11/19/22  7:55 AM   Specimen: Nasal Mucosa; Nasal Swab  Result Value Ref Range Status   MRSA by PCR Next Gen NOT DETECTED NOT DETECTED Final    Comment: (NOTE) The GeneXpert MRSA Assay (FDA approved for NASAL specimens only), is one component of a comprehensive MRSA colonization surveillance program. It is not intended to diagnose MRSA infection nor to guide or monitor treatment for MRSA infections. Test performance is not FDA approved in patients less than 40 years old. Performed at [facility], [address] [place], [place] [address]   SARS Coronavirus 2 by RT PCR (hospital order, performed in [facility] hospital lab) *cepheid single result test* Anterior Nasal Swab     Status: None   Collection Time: 11/19/22  7:55 AM   Specimen: Anterior Nasal Swab  Result Value Ref Range Status   SARS Coronavirus 2 by RT PCR NEGATIVE NEGATIVE Final    Comment: (NOTE) SARS-CoV-2 target nucleic acids are NOT DETECTED.  The SARS-CoV-2 RNA is generally detectable in upper and lower respiratory specimens during the acute phase of  infection. The lowest concentration of SARS-CoV-2 viral copies this assay can detect is 250 copies / mL. A negative result does not preclude SARS-CoV-2 infection and should not be used as the sole basis for treatment or other patient management decisions.  A negative result may occur with improper specimen collection / handling, submission of specimen other than nasopharyngeal swab, presence of viral mutation(s) within the areas targeted by this assay, and inadequate number of viral copies (<250 copies / mL). A negative result must be combined with clinical observations, patient history, and epidemiological information.  Fact Sheet for Patients:   https://www.patel.info/  Fact Sheet for Healthcare Providers: https://hall.com/  This test is not yet approved or  cleared by the [place] FDA and has been authorized for detection and/or diagnosis of SARS-CoV-2 by FDA under an Emergency Use Authorization (EUA).  This EUA will remain in effect (meaning this test can be used) for the duration of the COVID-19 declaration under Section 564(b)(1) of the Act, 21 U.S.C. section 360bbb-3(b)(1), unless the authorization is terminated or revoked sooner.  Performed at [facility], [address], [place], [place] [address]     Labs: CBC: Recent Labs  Lab 11/18/22 1433 11/19/22 0526 11/21/22 0427  WBC 15.5* 15.4* 15.2*  NEUTROABS 13.0*  --  14.1*  HGB 8.5* 7.9* 8.2*  HCT 27.3* 25.7* 25.8*  MCV 91.3 90.5 90.2  PLT 232 253 026   Basic Metabolic Panel: Recent Labs  Lab 11/18/22 1433 11/19/22 0526 11/21/22 0427  NA 135 138 135  K 4.4 3.4* 4.6  CL 99 101 95*  CO2 19* 24 26  GLUCOSE 427* 109* 272*  BUN 87* 68* 60*  CREATININE 13.04* 11.14* 10.03*  CALCIUM 8.7* 8.9 8.6*  MG  --  1.8  --    Liver Function Tests: Recent Labs  Lab 11/18/22 1433 11/19/22 0526 11/21/22 0427  AST 43* 34 18  ALT 122* 105* 63*  ALKPHOS 132* 108 97   BILITOT 0.7 0.7 0.2*  PROT 7.1 6.6 6.4*  ALBUMIN 4.0 3.8 3.6   CBG: Recent Labs  Lab 11/22/22 0804 11/22/22 1127 11/22/22 1611 11/22/22 2109 11/23/22 0737  GLUCAP 278* 295* 358* 198* 266*    Discharge time spent: greater than 30 minutes.  Signed: Barton Dubois, MD [facility] 11/23/2022

## 2022-11-23 NOTE — Care Management Important Message (Signed)
Important Message  Patient Details  Name: Patricia [name] MRN: [id number] Date of Birth: 12-14-1971   Medicare Important Message Given:  Yes     Tommy Medal 11/23/2022, 3:11 PM

## 2022-11-23 NOTE — Progress Notes (Signed)
[facility] Progress Note   Assessment/ Plan:   ESRD MWF LUE AVF [facility]        Urgent HD 11/18/22, off schedule, then 11/20/22 HD 11/22/22 2K 3.5h, 2-3L UF goal, use AVF Next HD 11/24/22, then can get on regular schedule next Monday 2.  HTN emergency with AHRF: getting better- off nitro gtt and on PO minoxidil, pressures better and even probably overcontrolled now.  Will reduce minoxidil to 2.5 mg BID 3.  Anemia:Hb 7.9 was 8.5 at admit.  EGD without pathology 4.  CKDBMD: cont outpt mgmt:  5.  OSA 6.  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  Dispo: pending  Subjective:    Seen in room.  Had HD yesterday, only 700 mL removed.  Feeling poorly with headache and diarrhea.  Was up to sit in the chair and got nauseated and hot.     Objective:   BP (!) 133/52   Pulse 66   Temp 97.8 F (36.6 C) (Oral)   Resp (!) 22   Ht 5\' 3"  (1.6 m)   Wt 111.5 kg   SpO2 98%   BMI 43.54 kg/m   Physical Exam: Gen:NAD, lying in bed, appears uncomfortable CVS: RRR Resp: clear Abd: soft Ext: no LE edema ACCESS: L AVF + T/B  Labs: BMET Recent Labs  Lab 11/18/22 1433 11/19/22 0526 11/21/22 0427  NA 135 138 135  K 4.4 3.4* 4.6  CL 99 101 95*  CO2 19* 24 26  GLUCOSE 427* 109* 272*  BUN 87* 68* 60*  CREATININE 13.04* 11.14* 10.03*  CALCIUM 8.7* 8.9 8.6*   CBC Recent Labs  Lab 11/18/22 1433 11/19/22 0526 11/21/22 0427  WBC 15.5* 15.4* 15.2*  NEUTROABS 13.0*  --  14.1*  HGB 8.5* 7.9* 8.2*  HCT 27.3* 25.7* 25.8*  MCV 91.3 90.5 90.2  PLT 232 253 231      Medications:     aspirin EC  81 mg Oral Daily   azithromycin  250 mg Oral Q M,W,F   Chlorhexidine Gluconate Cloth  6 each Topical Q0600   cinacalcet  30 mg Oral Q breakfast   insulin aspart  0-15 Units Subcutaneous TID WC   insulin aspart  0-5 Units Subcutaneous QHS   insulin aspart  4 Units Subcutaneous TID WC   labetalol  200 mg Oral BID   linaclotide  290 mcg Oral QAC breakfast    methylPREDNISolone (SOLU-MEDROL) injection  40 mg Intravenous Q12H   minoxidil  5 mg Oral BID   mometasone-formoterol  2 puff Inhalation BID   pantoprazole (PROTONIX) IV  40 mg Intravenous Q12H   rOPINIRole  1 mg Oral BID   rosuvastatin  10 mg Oral Daily    Madelon Lips MD 11/23/2022, 10:04 AM

## 2022-11-24 DIAGNOSIS — N2581 Secondary hyperparathyroidism of renal origin: Secondary | ICD-10-CM | POA: Diagnosis not present

## 2022-11-24 DIAGNOSIS — N186 End stage renal disease: Secondary | ICD-10-CM | POA: Diagnosis not present

## 2022-11-24 DIAGNOSIS — D631 Anemia in chronic kidney disease: Secondary | ICD-10-CM | POA: Diagnosis not present

## 2022-11-24 DIAGNOSIS — D509 Iron deficiency anemia, unspecified: Secondary | ICD-10-CM | POA: Diagnosis not present

## 2022-11-24 DIAGNOSIS — Z992 Dependence on renal dialysis: Secondary | ICD-10-CM | POA: Diagnosis not present

## 2022-11-24 DIAGNOSIS — N250 Renal osteodystrophy: Secondary | ICD-10-CM | POA: Diagnosis not present

## 2022-11-26 ENCOUNTER — Other Ambulatory Visit: Payer: Self-pay

## 2022-11-26 ENCOUNTER — Emergency Department (HOSPITAL_COMMUNITY): Payer: Medicare Other

## 2022-11-26 ENCOUNTER — Observation Stay (HOSPITAL_COMMUNITY)
Admission: EM | Admit: 2022-11-26 | Discharge: 2022-11-28 | Disposition: A | Discharge status: Home / Self Care | Payer: Medicare Other | Attending: Internal Medicine | Admitting: Internal Medicine

## 2022-11-26 ENCOUNTER — Encounter (HOSPITAL_COMMUNITY): Payer: Self-pay | Admitting: Gastroenterology

## 2022-11-26 DIAGNOSIS — R52 Pain, unspecified: Secondary | ICD-10-CM | POA: Diagnosis not present

## 2022-11-26 DIAGNOSIS — J4481 Bronchiolitis obliterans and bronchiolitis obliterans syndrome: Secondary | ICD-10-CM | POA: Diagnosis present

## 2022-11-26 DIAGNOSIS — D509 Iron deficiency anemia, unspecified: Secondary | ICD-10-CM | POA: Diagnosis not present

## 2022-11-26 DIAGNOSIS — I509 Heart failure, unspecified: Secondary | ICD-10-CM | POA: Diagnosis not present

## 2022-11-26 DIAGNOSIS — D631 Anemia in chronic kidney disease: Secondary | ICD-10-CM | POA: Diagnosis not present

## 2022-11-26 DIAGNOSIS — Z79899 Other long term (current) drug therapy: Secondary | ICD-10-CM | POA: Diagnosis not present

## 2022-11-26 DIAGNOSIS — N250 Renal osteodystrophy: Secondary | ICD-10-CM | POA: Diagnosis not present

## 2022-11-26 DIAGNOSIS — Z7984 Long term (current) use of oral hypoglycemic drugs: Secondary | ICD-10-CM | POA: Insufficient documentation

## 2022-11-26 DIAGNOSIS — N186 End stage renal disease: Secondary | ICD-10-CM | POA: Diagnosis not present

## 2022-11-26 DIAGNOSIS — E66813 Obesity, class 3: Secondary | ICD-10-CM | POA: Diagnosis present

## 2022-11-26 DIAGNOSIS — I5032 Chronic diastolic (congestive) heart failure: Secondary | ICD-10-CM | POA: Insufficient documentation

## 2022-11-26 DIAGNOSIS — Z7982 Long term (current) use of aspirin: Secondary | ICD-10-CM | POA: Diagnosis not present

## 2022-11-26 DIAGNOSIS — E1122 Type 2 diabetes mellitus with diabetic chronic kidney disease: Secondary | ICD-10-CM | POA: Diagnosis not present

## 2022-11-26 DIAGNOSIS — I132 Hypertensive heart and chronic kidney disease with heart failure and with stage 5 chronic kidney disease, or end stage renal disease: Secondary | ICD-10-CM | POA: Diagnosis not present

## 2022-11-26 DIAGNOSIS — Z992 Dependence on renal dialysis: Secondary | ICD-10-CM | POA: Insufficient documentation

## 2022-11-26 DIAGNOSIS — G4733 Obstructive sleep apnea (adult) (pediatric): Secondary | ICD-10-CM | POA: Diagnosis present

## 2022-11-26 DIAGNOSIS — R079 Chest pain, unspecified: Secondary | ICD-10-CM | POA: Diagnosis present

## 2022-11-26 DIAGNOSIS — I5020 Unspecified systolic (congestive) heart failure: Secondary | ICD-10-CM

## 2022-11-26 DIAGNOSIS — R0789 Other chest pain: Principal | ICD-10-CM | POA: Insufficient documentation

## 2022-11-26 DIAGNOSIS — R072 Precordial pain: Secondary | ICD-10-CM | POA: Diagnosis present

## 2022-11-26 DIAGNOSIS — I110 Hypertensive heart disease with heart failure: Secondary | ICD-10-CM | POA: Diagnosis not present

## 2022-11-26 DIAGNOSIS — R7989 Other specified abnormal findings of blood chemistry: Secondary | ICD-10-CM | POA: Diagnosis present

## 2022-11-26 DIAGNOSIS — N2581 Secondary hyperparathyroidism of renal origin: Secondary | ICD-10-CM | POA: Diagnosis not present

## 2022-11-26 DIAGNOSIS — E119 Type 2 diabetes mellitus without complications: Secondary | ICD-10-CM

## 2022-11-26 LAB — CBC
HCT: 27.2 % — ABNORMAL LOW (ref 36.0–46.0)
Hemoglobin: 8.5 g/dL — ABNORMAL LOW (ref 12.0–15.0)
MCH: 28.9 pg (ref 26.0–34.0)
MCHC: 31.3 g/dL (ref 30.0–36.0)
MCV: 92.5 fL (ref 80.0–100.0)
Platelets: 200 10*3/uL (ref 150–400)
RBC: 2.94 MIL/uL — ABNORMAL LOW (ref 3.87–5.11)
RDW: 15.8 % — ABNORMAL HIGH (ref 11.5–15.5)
WBC: 14.5 10*3/uL — ABNORMAL HIGH (ref 4.0–10.5)
nRBC: 0.1 % (ref 0.0–0.2)

## 2022-11-26 LAB — BASIC METABOLIC PANEL
Anion gap: 12 (ref 5–15)
BUN: 34 mg/dL — ABNORMAL HIGH (ref 6–20)
CO2: 23 mmol/L (ref 22–32)
Calcium: 8.2 mg/dL — ABNORMAL LOW (ref 8.9–10.3)
Chloride: 103 mmol/L (ref 98–111)
Creatinine, Ser: 7.24 mg/dL — ABNORMAL HIGH (ref 0.44–1.00)
GFR, Estimated: 6 mL/min — ABNORMAL LOW (ref >60)
Glucose, Bld: 219 mg/dL — ABNORMAL HIGH (ref 70–99)
Potassium: 3.1 mmol/L — ABNORMAL LOW (ref 3.5–5.1)
Sodium: 138 mmol/L (ref 135–145)

## 2022-11-26 LAB — GLUCOSE, CAPILLARY
Glucose-Capillary: 277 mg/dL — ABNORMAL HIGH (ref 70–99)
Glucose-Capillary: 123 mg/dL — ABNORMAL HIGH (ref 70–99)
Glucose-Capillary: 206 mg/dL — ABNORMAL HIGH (ref 70–99)

## 2022-11-26 LAB — TROPONIN I (HIGH SENSITIVITY)
Troponin I (High Sensitivity): 183 ng/L (ref <18)
Troponin I (High Sensitivity): 186 ng/L (ref <18)

## 2022-11-26 MED ORDER — GLIPIZIDE 5 MG PO TABS: 15.0000 mg | ORAL_TABLET | Freq: Two times a day (BID) | ORAL | Status: DC

## 2022-11-26 MED ORDER — FERRIC CITRATE 1 GM 210 MG(FE) PO TABS
420.0000 mg | ORAL_TABLET | Freq: Two times a day (BID) | ORAL | Status: DC
  Administered 2022-11-26 – 2022-11-28 (×2): 420 mg via ORAL
  Filled 2022-11-26 (×6): qty 2

## 2022-11-26 MED ORDER — VITAMIN D 25 MCG (1000 UNIT) PO TABS
5000.0000 [IU] | ORAL_TABLET | Freq: Every day | ORAL | Status: DC
  Administered 2022-11-26 – 2022-11-28 (×2): 5000 [IU] via ORAL
  Filled 2022-11-26 (×2): qty 5

## 2022-11-26 MED ORDER — ROPINIROLE HCL ER 2 MG PO TB24
2.0000 mg | ORAL_TABLET | Freq: Every day | ORAL | Status: DC
  Filled 2022-11-26 (×2): qty 1

## 2022-11-26 MED ORDER — ONDANSETRON HCL 4 MG PO TABS: 4.0000 mg | ORAL_TABLET | Freq: Four times a day (QID) | ORAL | Status: DC | PRN

## 2022-11-26 MED ORDER — ISOSORBIDE DINITRATE 10 MG PO TABS
10.0000 mg | ORAL_TABLET | Freq: Three times a day (TID) | ORAL | Status: DC
  Administered 2022-11-26 (×2): 10 mg via ORAL
  Filled 2022-11-26 (×6): qty 1

## 2022-11-26 MED ORDER — SEVELAMER CARBONATE 800 MG PO TABS
1600.0000 mg | ORAL_TABLET | Freq: Two times a day (BID) | ORAL | Status: DC
  Administered 2022-11-26 – 2022-11-28 (×3): 1600 mg via ORAL
  Filled 2022-11-26 (×3): qty 2

## 2022-11-26 MED ORDER — ALBUTEROL SULFATE (2.5 MG/3ML) 0.083% IN NEBU: 2.5000 mg | INHALATION_SOLUTION | Freq: Four times a day (QID) | RESPIRATORY_TRACT | Status: DC | PRN

## 2022-11-26 MED ORDER — LABETALOL HCL 100 MG PO TABS
150.0000 mg | ORAL_TABLET | Freq: Two times a day (BID) | ORAL | Status: DC
  Administered 2022-11-26 – 2022-11-28 (×3): 150 mg via ORAL
  Filled 2022-11-26 (×7): qty 1.5

## 2022-11-26 MED ORDER — POTASSIUM CHLORIDE CRYS ER 20 MEQ PO TBCR
40.0000 meq | EXTENDED_RELEASE_TABLET | Freq: Once | ORAL | Status: AC
  Administered 2022-11-26: 40 meq via ORAL
  Filled 2022-11-26: qty 2

## 2022-11-26 MED ORDER — LINACLOTIDE 145 MCG PO CAPS
290.0000 ug | ORAL_CAPSULE | Freq: Every day | ORAL | Status: DC
  Administered 2022-11-27 – 2022-11-28 (×2): 290 ug via ORAL
  Filled 2022-11-26 (×2): qty 2

## 2022-11-26 MED ORDER — RENA-VITE PO TABS
1.0000 | ORAL_TABLET | Freq: Every day | ORAL | Status: DC
  Administered 2022-11-26 – 2022-11-28 (×3): 1 via ORAL
  Filled 2022-11-26 (×3): qty 1

## 2022-11-26 MED ORDER — SODIUM CHLORIDE 0.9% FLUSH: 3.0000 mL | INTRAVENOUS | Status: DC | PRN

## 2022-11-26 MED ORDER — MOMETASONE FURO-FORMOTEROL FUM 200-5 MCG/ACT IN AERO
2.0000 | INHALATION_SPRAY | Freq: Two times a day (BID) | RESPIRATORY_TRACT | Status: DC
  Administered 2022-11-26 – 2022-11-28 (×3): 2 via RESPIRATORY_TRACT
  Filled 2022-11-26: qty 8.8

## 2022-11-26 MED ORDER — BISACODYL 10 MG RE SUPP: 10.0000 mg | Freq: Every day | RECTAL | Status: DC | PRN

## 2022-11-26 MED ORDER — TRAZODONE HCL 50 MG PO TABS
50.0000 mg | ORAL_TABLET | Freq: Every evening | ORAL | Status: DC | PRN
  Administered 2022-11-26: 50 mg via ORAL
  Filled 2022-11-26: qty 1

## 2022-11-26 MED ORDER — PREDNISONE 10 MG PO TABS
10.0000 mg | ORAL_TABLET | Freq: Every day | ORAL | Status: DC
  Administered 2022-11-27 – 2022-11-28 (×2): 10 mg via ORAL
  Filled 2022-11-26 (×2): qty 1

## 2022-11-26 MED ORDER — SODIUM CHLORIDE 0.9% FLUSH
3.0000 mL | Freq: Two times a day (BID) | INTRAVENOUS | Status: DC
  Administered 2022-11-26 (×2): 3 mL via INTRAVENOUS

## 2022-11-26 MED ORDER — SODIUM CHLORIDE 0.9 % IV SOLN: INTRAVENOUS | Status: DC | PRN

## 2022-11-26 MED ORDER — SENNOSIDES-DOCUSATE SODIUM 8.6-50 MG PO TABS
2.0000 | ORAL_TABLET | Freq: Every day | ORAL | Status: DC
  Administered 2022-11-26 – 2022-11-27 (×2): 2 via ORAL
  Filled 2022-11-26 (×2): qty 2

## 2022-11-26 MED ORDER — INSULIN ASPART 100 UNIT/ML IJ SOLN
0.0000 [IU] | Freq: Every day | INTRAMUSCULAR | Status: DC
  Administered 2022-11-26: 2 [IU] via SUBCUTANEOUS

## 2022-11-26 MED ORDER — SEVELAMER CARBONATE 800 MG PO TABS: 2400.0000 mg | ORAL_TABLET | Freq: Every day | ORAL | Status: DC

## 2022-11-26 MED ORDER — ASPIRIN 81 MG PO TBEC
81.0000 mg | DELAYED_RELEASE_TABLET | Freq: Every day | ORAL | Status: DC
  Administered 2022-11-26 – 2022-11-28 (×3): 81 mg via ORAL
  Filled 2022-11-26 (×3): qty 1

## 2022-11-26 MED ORDER — AZITHROMYCIN 250 MG PO TABS
250.0000 mg | ORAL_TABLET | ORAL | Status: DC
  Administered 2022-11-26 – 2022-11-28 (×2): 250 mg via ORAL
  Filled 2022-11-26 (×3): qty 1

## 2022-11-26 MED ORDER — HEPARIN SODIUM (PORCINE) 5000 UNIT/ML IJ SOLN
5000.0000 [IU] | Freq: Three times a day (TID) | INTRAMUSCULAR | Status: DC
  Administered 2022-11-26 – 2022-11-28 (×4): 5000 [IU] via SUBCUTANEOUS
  Filled 2022-11-26 (×4): qty 1

## 2022-11-26 MED ORDER — PANTOPRAZOLE SODIUM 40 MG PO TBEC
40.0000 mg | DELAYED_RELEASE_TABLET | Freq: Two times a day (BID) | ORAL | Status: DC
  Administered 2022-11-26 – 2022-11-28 (×5): 40 mg via ORAL
  Filled 2022-11-26 (×5): qty 1

## 2022-11-26 MED ORDER — INSULIN ASPART 100 UNIT/ML IJ SOLN
0.0000 [IU] | Freq: Three times a day (TID) | INTRAMUSCULAR | Status: DC
  Administered 2022-11-26 – 2022-11-27 (×2): 1 [IU] via SUBCUTANEOUS
  Administered 2022-11-28: 5 [IU] via SUBCUTANEOUS

## 2022-11-26 MED ORDER — DICYCLOMINE HCL 10 MG PO CAPS
10.0000 mg | ORAL_CAPSULE | Freq: Two times a day (BID) | ORAL | Status: DC | PRN
  Administered 2022-11-27: 10 mg via ORAL
  Filled 2022-11-26: qty 1

## 2022-11-26 MED ORDER — SODIUM CHLORIDE 0.9% FLUSH
3.0000 mL | Freq: Two times a day (BID) | INTRAVENOUS | Status: DC
  Administered 2022-11-26 – 2022-11-27 (×2): 3 mL via INTRAVENOUS

## 2022-11-26 MED ORDER — CINACALCET HCL 30 MG PO TABS
30.0000 mg | ORAL_TABLET | Freq: Every day | ORAL | Status: DC
  Administered 2022-11-27 – 2022-11-28 (×2): 30 mg via ORAL
  Filled 2022-11-26 (×4): qty 1

## 2022-11-26 MED ORDER — ONDANSETRON HCL 4 MG/2ML IJ SOLN: 4.0000 mg | Freq: Four times a day (QID) | INTRAMUSCULAR | Status: DC | PRN

## 2022-11-26 MED ORDER — MONTELUKAST SODIUM 10 MG PO TABS
10.0000 mg | ORAL_TABLET | Freq: Every day | ORAL | Status: DC
  Administered 2022-11-26 – 2022-11-27 (×2): 10 mg via ORAL
  Filled 2022-11-26 (×2): qty 1

## 2022-11-26 MED ORDER — ACETAMINOPHEN 325 MG PO TABS
650.0000 mg | ORAL_TABLET | Freq: Four times a day (QID) | ORAL | Status: DC | PRN
  Administered 2022-11-26 – 2022-11-27 (×2): 650 mg via ORAL
  Filled 2022-11-26 (×2): qty 2

## 2022-11-26 MED ORDER — ROPINIROLE HCL 0.5 MG PO TABS
1.0000 mg | ORAL_TABLET | Freq: Two times a day (BID) | ORAL | Status: DC
  Administered 2022-11-26 – 2022-11-28 (×4): 1 mg via ORAL
  Filled 2022-11-26: qty 1
  Filled 2022-11-26: qty 2
  Filled 2022-11-26: qty 1
  Filled 2022-11-26: qty 2

## 2022-11-26 MED ORDER — ACETAMINOPHEN 650 MG RE SUPP: 650.0000 mg | Freq: Four times a day (QID) | RECTAL | Status: DC | PRN

## 2022-11-26 MED ORDER — ONDANSETRON 4 MG PO TBDP: 4.0000 mg | ORAL_TABLET | ORAL | Status: DC | PRN

## 2022-11-26 MED ORDER — MINOXIDIL 2.5 MG PO TABS
2.5000 mg | ORAL_TABLET | Freq: Two times a day (BID) | ORAL | Status: DC
  Filled 2022-11-26 (×6): qty 1

## 2022-11-26 MED ORDER — NITROGLYCERIN 0.4 MG SL SUBL
0.4000 mg | SUBLINGUAL_TABLET | Freq: Once | SUBLINGUAL | Status: AC
  Administered 2022-11-26: 0.4 mg via SUBLINGUAL
  Filled 2022-11-26: qty 1

## 2022-11-26 MED ORDER — ROSUVASTATIN CALCIUM 5 MG PO TABS
10.0000 mg | ORAL_TABLET | Freq: Every day | ORAL | Status: DC
  Administered 2022-11-26 – 2022-11-28 (×3): 10 mg via ORAL
  Filled 2022-11-26 (×2): qty 1
  Filled 2022-11-26: qty 2
  Filled 2022-11-26: qty 1

## 2022-11-26 MED ORDER — POLYETHYLENE GLYCOL 3350 17 G PO PACK: 17.0000 g | PACK | Freq: Every day | ORAL | Status: DC | PRN

## 2022-11-26 NOTE — ED Triage Notes (Signed)
Pt was at dialysis in [place] and began having chest pains. Pt began having chest to left side of her chest feeling like someone sitting on her. Nitro was given to her by facility and pt stated it helped. Pt noted to have abd pain right now from dialysis cramps.

## 2022-11-26 NOTE — ED Provider Notes (Signed)
[facility] EMERGENCY DEPARTMENT Provider Note   CSN: [id number] Arrival date & time: 11/26/22  6599     History {Add pertinent medical, surgical, social history, OB history to HPI:1} Chief Complaint  Patient presents with   Chest Pain    Patricia [name] is a 51 y.o. female.  Patient has a history of end-stage renal disease and congestive heart failure.  Patient had chest pain during dialysis today.  She was given a nitro that helped the pain.  She finished 3 hours of dialysis she normally gets 4 hours   Chest Pain      Home Medications Prior to Admission medications   Medication Sig Start Date End Date Taking? Authorizing Provider  acetaminophen (TYLENOL) 325 MG tablet Take 650 mg by mouth every 6 (six) hours as needed.   Yes [provider]  albuterol (PROVENTIL) (2.5 MG/3ML) 0.083% nebulizer solution Take 3 mLs (2.5 mg total) by nebulization every 6 (six) hours as needed for wheezing or shortness of breath. 11/28/17  Yes Kathie Dike, MD  albuterol (VENTOLIN HFA) 108 (90 Base) MCG/ACT inhaler Inhale 2 puffs into the lungs every 6 (six) hours as needed for wheezing or shortness of breath. 06/28/22  Yes Chesley Mires, MD  aspirin EC 81 MG tablet Take 1 tablet (81 mg total) by mouth daily. Swallow whole. 12/07/21  Yes Fay Records, MD  azithromycin [facility]) 250 MG tablet 1 tab every Monday, Wednesday, and Friday 11/01/22  Yes Chesley Mires, MD  cetirizine (ZYRTEC) 10 MG tablet Take 10 mg by mouth daily. 03/24/22  Yes [provider]  Cholecalciferol (VITAMIN D3) 125 MCG (5000 UT) CAPS TAKE 1 CAPSULE BY MOUTH EVERY DAY 09/13/22  Yes Nida, Marella Chimes, MD  cinacalcet (SENSIPAR) 60 MG tablet Take 30 mg by mouth daily with breakfast.   Yes [provider]  dicyclomine (BENTYL) 10 MG capsule Take 1 capsule (10 mg total) by mouth 2 (two) times daily as needed. Patient taking differently: Take 10 mg by mouth 2 (two) times daily as needed for spasms. 03/20/22   Yes Annitta Needs, NP  docusate sodium (COLACE) 100 MG capsule Take 100 mg by mouth daily.   Yes [provider]  ferric citrate (AURYXIA) 1 GM 210 MG(Fe) tablet Take 2 tablets by mouth 2 (two) times daily with a meal. With Breakfast & with supper   Yes [provider]  glipiZIDE (GLUCOTROL) 10 MG tablet Take 15 mg by mouth 2 (two) times daily. 03/27/22  Yes [provider]  ipratropium (ATROVENT) 0.03 % nasal spray Place 2 sprays into both nostrils 2 (two) times daily.  06/06/19  Yes [provider]  labetalol (NORMODYNE) 100 MG tablet Take 1.5 tablets (150 mg total) by mouth 2 (two) times daily. 11/23/22 02/21/23 Yes Barton Dubois, MD  linaclotide [facility]) 290 MCG CAPS capsule Take 290 mcg by mouth daily before breakfast.   Yes [provider]  minoxidil (LONITEN) 2.5 MG tablet Take 1 tablet (2.5 mg total) by mouth 2 (two) times daily. 11/23/22  Yes Barton Dubois, MD  montelukast (SINGULAIR) 10 MG tablet TAKE 1 TABLET BY MOUTH EVERYDAY AT BEDTIME 09/13/22  Yes Chesley Mires, MD  multivitamin (RENA-VIT) TABS tablet Take 1 tablet by mouth daily.   Yes [provider]  ondansetron (ZOFRAN-ODT) 4 MG disintegrating tablet Take 1 tablet (4 mg total) by mouth as needed for nausea or vomiting. 11/23/22  Yes Barton Dubois, MD  pantoprazole (PROTONIX) 40 MG tablet Take 1 tablet (40 mg total)  by mouth 2 (two) times daily. 30 minutes before breakfast 11/23/22  Yes Barton Dubois, MD  predniSONE (DELTASONE) 10 MG tablet Take 3 tablets (30 mg total) by mouth daily with breakfast for 7 days, THEN 2 tablets (20 mg total) daily with breakfast for 7 days, THEN 1 tablet (10 mg total) daily with breakfast for 28 days. 11/23/22 01/04/23 Yes Barton Dubois, MD  rOPINIRole (REQUIP XL) 2 MG 24 hr tablet Take 2 mg by mouth at bedtime.  09/10/16  Yes [provider]  rosuvastatin (CRESTOR) 10 MG tablet Take 1 tablet (10 mg total) by mouth daily. 01/08/22  Yes Fay Records,  MD  sevelamer carbonate (RENVELA) 800 MG tablet Take 800 mg by mouth See admin instructions. Take 2 tablets (1600 mg) in the morning, take 3 tablets (2400 mg) with lunch or snack,  and take 2 tablets (1600 mg) by mouth in the evening with dinner meal.   Yes [provider]  sodium chloride (OCEAN) 0.65 % SOLN nasal spray Place 1 spray into both nostrils as needed for congestion. 01/02/20  Yes Hongalgi, Lenis Dickinson, MD  SYMBICORT 160-4.5 MCG/ACT inhaler Inhale 2 puffs into the lungs daily. 04/20/22  Yes [provider]      Allergies    Amlodipine besylate and Reglan [metoclopramide]    Review of Systems   Review of Systems  Cardiovascular:  Positive for chest pain.    Physical Exam Updated Vital Signs BP 121/64   Pulse 78   Temp 98 F (36.7 C) (Oral)   Resp 18   Ht 5\' 3"  (1.6 m)   Wt 111.1 kg   SpO2 93%   BMI 43.40 kg/m  Physical Exam  ED Results / Procedures / Treatments   Labs (all labs ordered are listed, but only abnormal results are displayed) Labs Reviewed  BASIC METABOLIC PANEL - Abnormal; Notable for the following components:      Result Value   Potassium 3.1 (*)    Glucose, Bld 219 (*)    BUN 34 (*)    Creatinine, Ser 7.24 (*)    Calcium 8.2 (*)    GFR, Estimated 6 (*)    All other components within normal limits  CBC - Abnormal; Notable for the following components:   WBC 14.5 (*)    RBC 2.94 (*)    Hemoglobin 8.5 (*)    HCT 27.2 (*)    RDW 15.8 (*)    All other components within normal limits  TROPONIN I (HIGH SENSITIVITY) - Abnormal; Notable for the following components:   Troponin I (High Sensitivity) 183 (*)    All other components within normal limits  POC URINE PREG, ED  TROPONIN I (HIGH SENSITIVITY)    EKG EKG Interpretation  Date/Time:  Monday November 26 2022 10:14:49 EST Ventricular Rate:  87 PR Interval:  189 QRS Duration: 94 QT Interval:  368 QTC Calculation: 443 R Axis:   70 Text Interpretation: Sinus rhythm Borderline  repolarization abnormality Confirmed by Milton Ferguson [phone]) on 11/26/2022 10:42:41 AM  Radiology DG Chest Port 1 View  Result Date: 11/26/2022 CLINICAL DATA:  Chest pain EXAM: PORTABLE CHEST 1 VIEW COMPARISON:  11/18/2022 FINDINGS: Cardiomegaly again noted with vascular congestion. External artifact overlies the chest. Postoperative clips project over the left chest and axilla. No definite superimposed pneumonia, CHF, large effusion or pneumothorax. Trachea midline. IMPRESSION: Cardiomegaly with vascular congestion. Electronically Signed   By: Jerilynn Mages.  Shick M.D.   On: 11/26/2022 10:32    Procedures Procedures  {  Document cardiac monitor, telemetry assessment procedure when appropriate:1}  Medications Ordered in ED Medications  nitroGLYCERIN (NITROSTAT) SL tablet 0.4 mg (has no administration in time range)  potassium chloride SA (KLOR-CON M) CR tablet 40 mEq (has no administration in time range)    ED Course/ Medical Decision Making/ A&P  I spoke with cardiology Dr. Domenic Polite and he stated the patient to be admitted at [facility] provide the hospitalist.  cardiology will consult the hospitalist for the consult                         Medical Decision Making Amount and/or Complexity of Data Reviewed Labs: ordered. Radiology: ordered.  Risk Prescription drug management. Decision regarding hospitalization.  Patient with chest pain and congestive heart failure.  she will be admitted to medicine.  nephrology.  Will see the patient  {Document critical care time when appropriate:1} {Document review of labs and clinical decision tools ie heart score, Chads2Vasc2 etc:1}  {Document your independent review of radiology images, and any outside records:1} {Document your discussion with family members, caretakers, and with consultants:1} {Document social determinants of health affecting pt's care:1} {Document your decision making why or why not admission, treatments were needed:1} Final  Clinical Impression(s) / ED Diagnoses Final diagnoses:  Atypical chest pain  Systolic congestive heart failure, unspecified HF chronicity ([place])    Rx / DC Orders ED Discharge Orders     None

## 2022-11-26 NOTE — H&P (Addendum)
Patient Demographics:    Patricia [name], is a 51 y.o. female  MRN: [id number]   DOB - 12/23/1970  Admit Date - 11/26/2022  Outpatient Primary MD for the patient is Jake Samples, PA-C   Assessment & Plan:   Assessment and Plan:  1)Atypical chest pain----Patient tolerated about 3 hours out of his usual 4 hours of hemodialysis on 11/26/2022 before being sent to the ED with persistent chest pains -In the ED today troponin 183 repeat 186, Troponin was 62 and 59 on 11/18/2022 -EKG sinus rhythm without acute ischemic type changes -Patient was discharged on 11/23/2022 after being evaluated here in the hospital for chest pains and dyspnea at the time -Presents again with similar symptoms -Place in Observation status on  telemetry monitored unit, check serial troponins and EKG to rule out acute coronary syndrome .  If patient rules out for ACS, will need further cardiovascular risk stratification . Cardiology consult to help decide if Stress test is needed in am Versus other diagnostic modalities.   -EDP discussed case with cardiology service prior to admission -Continue aspirin isosorbide and Lipitor -VQ scan was negative on 11/19/2022 -Echo on 11/19/2022 with EF of 50 to 55%, no regional wall motion normalities and grade 1 diastolic dysfunction mild aortic stenosis noted at the time   2)ESRD--MWF-patient completed 3 out of has scheduled follow-up after hemodialysis on 11/26/2022 -Chest x-ray suggest volume overload -EDP discussed case with on-call nephrologist Dr. 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 Halford Chessman -Continue bronchodilators   5) persistent leukocytosis--WBC is 14.5 which is close to patient's recent values -Most likely related to ongoing steroid therapy for #4 above  6) hypokalemia--replace and recheck  7) chronic anemia of ESRD--- Hgb 8.5 which is close to patient's recent baseline -Stable overall -Continue iron supplementation -EPO/ESA agent per nephrology team  8)DM2-A1c was 9.5 a week ago--reflecting uncontrolled DM with hyperglycemia Use Novolog/Humalog Sliding scale insulin with Accu-Cheks/Fingersticks as ordered   9)Morbid Obesity-/OSA -Low calorie diet, portion control and increase physical activity discussed with patient -Body mass index is 43.5 kg/m. -CPAP nightly as advised  Dispo: The patient is from: Home              Anticipated d/c is to: Home              Anticipated d/c date is: 1 day              Patient currently is not medically stable to d/c. Barriers: Not Clinically Stable-  With History of - Reviewed by me  Past Medical History:  Diagnosis Date  Anemia    Ankle fracture    Arthritis    Blood transfusion without reported diagnosis    Breast cancer ([place])    Cancer ([place])    Diabetes mellitus without complication ([place])    Dialysis patient ([place])    mon, wed, friday,    End stage renal disease on dialysis ([place])    M/W/F [facility] in [place]   GERD (gastroesophageal reflux disease)    Hypertension    OSA (obstructive sleep apnea)    uses CPAP sometimes   Pneumonia    PONV (postoperative nausea and vomiting)    Wears glasses       Past Surgical History:  Procedure Laterality Date   ABDOMINAL HYSTERECTOMY     AV FISTULA PLACEMENT  11/2014   at [facility] N/A 04/2021   BALLOON DILATION N/A  07/10/2016   Procedure: BALLOON DILATION;  Surgeon: Danie Binder, MD;  [facility];  Service: Endoscopy;  Laterality: N/A;  Pyloric dilation   BREAST LUMPECTOMY     CESAREAN SECTION     CHOLECYSTECTOMY     COLONOSCOPY WITH PROPOFOL N/A 09/27/2016   Dr. Gala Romney: Internal hemorrhoids repeat colonoscopy in 10 years   DILATION AND CURETTAGE OF UTERUS     ESOPHAGOGASTRODUODENOSCOPY N/A 07/10/2016   Dr.Fields- normal esophagus, gastric stenosis was found at the pylorus, gastritis on bx, normal examined duodenun   ESOPHAGOGASTRODUODENOSCOPY (EGD) WITH PROPOFOL N/A 11/21/2022   Procedure: ESOPHAGOGASTRODUODENOSCOPY (EGD) WITH PROPOFOL;  Surgeon: Harvel Quale, MD;  [facility];  Service: Gastroenterology;  Laterality: N/A;   EXTERNAL FIXATION REMOVAL Right 10/29/2018   Procedure: REMOVAL RIGHT ANKLE BIOMET ZIMMER EXTERNAL FIXATOR, SHORT LEG CAST APPLICATION;  Surgeon: Marybelle Killings, MD;  Location: [facility];  Service: Orthopedics;  Laterality: Right;   MASTECTOMY     left sided   ORIF ANKLE FRACTURE Right 10/06/2018   Procedure: OPEN REDUCTION INTERNAL FIXATION (ORIF) RIGHT ANKLE TRIMALLEOLAR;  Surgeon: Marybelle Killings, MD;  Location: [facility];  Service: Orthopedics;  Laterality: Right;    Chief Complaint  Patient presents with   Chest Pain      HPI:    Patricia [name]  is a 51 y.o. 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,, history of chronic diastolic dysfunction CHF and CHF who was discharged on 11/23/2022 presented back to the ED from hemodialysis center with persistent chest pains -Patient tolerated about 3 hours out of his usual 4 hours of hemodialysis on 11/26/2022 before being sent to the ED with persistent chest pains -In the ED today troponin 183 repeat 186, Troponin was 62 and 59 on 11/18/2022 -EKG  sinus rhythm without acute ischemic type changes -Potassium is 3.1 Hgb 8.5 which is close to patient's recent baseline -WBC is 14.5 which is close to patient's recent values -Chest x-ray consistent with cardiomegaly with vascular congestion -VQ scan was negative on 11/19/2022 No fever  Or chills  No Nausea, Vomiting or Diarrhea  Additional hx obtained from [name] and female friend at bedside..     Review of systems:    In addition to the HPI above,   A full Review of  Systems was done, all other systems reviewed are negative except as noted above in HPI , .    Social History:  Reviewed by me  Social History   Tobacco Use   Smoking status: Never   Smokeless tobacco: Never  Substance Use Topics   Alcohol use: No    Alcohol/week: 0.0 standard drinks of alcohol    Family History :  Reviewed by me    Family History  Problem Relation Age of Onset   Diabetes Mellitus II Mother    Hypertension Mother    Heart block Mother    Hypertension Sister    Hypertension Sister    Colon cancer Neg Hx      Home Medications:   Prior to Admission medications   Medication Sig Start Date End Date Taking? Authorizing Provider  acetaminophen (TYLENOL) 325 MG tablet Take 650 mg by mouth every 6 (six) hours as needed.   Yes [provider]  albuterol (PROVENTIL) (2.5 MG/3ML) 0.083% nebulizer solution Take 3 mLs (2.5 mg total) by nebulization every 6 (six) hours as needed for wheezing or shortness of breath. 11/28/17  Yes Kathie Dike, MD  albuterol (VENTOLIN HFA) 108 (90 Base) MCG/ACT inhaler Inhale 2 puffs into the lungs every 6 (six) hours as needed for wheezing or shortness of breath. 06/28/22  Yes Chesley Mires, MD  aspirin EC 81 MG tablet Take 1 tablet (81 mg total) by mouth daily. Swallow whole. 12/07/21  Yes Fay Records, MD  azithromycin [facility]) 250 MG tablet 1 tab every Monday, Wednesday, and Friday 11/01/22  Yes Chesley Mires, MD  cetirizine (ZYRTEC) 10 MG  tablet Take 10 mg by mouth daily. 03/24/22  Yes [provider]  Cholecalciferol (VITAMIN D3) 125 MCG (5000 UT) CAPS TAKE 1 CAPSULE BY MOUTH EVERY DAY 09/13/22  Yes Nida, Marella Chimes, MD  cinacalcet (SENSIPAR) 60 MG tablet Take 30 mg by mouth daily with breakfast.   Yes [provider]  dicyclomine (BENTYL) 10 MG capsule Take 1 capsule (10 mg total) by mouth 2 (two) times daily as needed. Patient taking differently: Take 10 mg by mouth 2 (two) times daily as needed for spasms. 03/20/22  Yes Annitta Needs, NP  docusate sodium (COLACE) 100 MG capsule Take 100 mg by mouth daily.   Yes [provider]  ferric citrate (AURYXIA) 1 GM 210 MG(Fe) tablet Take 2 tablets by mouth 2 (two) times daily with a meal. With Breakfast & with supper   Yes [provider]  glipiZIDE (GLUCOTROL) 10 MG tablet Take 15 mg by mouth 2 (two) times daily. 03/27/22  Yes [provider]  ipratropium (ATROVENT) 0.03 % nasal spray Place 2 sprays into both nostrils 2 (two) times daily.  06/06/19  Yes [provider]  labetalol (NORMODYNE) 100 MG tablet Take 1.5 tablets (150 mg total) by mouth 2 (two) times daily. 11/23/22 02/21/23 Yes Barton Dubois, MD  linaclotide [facility]) 290 MCG CAPS capsule Take 290 mcg by mouth daily before breakfast.   Yes [provider]  minoxidil (LONITEN) 2.5 MG tablet Take 1 tablet (2.5 mg total) by mouth 2 (two) times daily. 11/23/22  Yes Barton Dubois, MD  montelukast (SINGULAIR) 10 MG tablet TAKE 1 TABLET BY MOUTH EVERYDAY AT BEDTIME 09/13/22  Yes Chesley Mires, MD  multivitamin (RENA-VIT) TABS tablet Take 1 tablet by mouth daily.   Yes [provider]  ondansetron (ZOFRAN-ODT) 4 MG disintegrating tablet Take 1 tablet (4 mg total) by mouth as needed for nausea or vomiting. 11/23/22  Yes Barton Dubois, MD  pantoprazole (PROTONIX) 40 MG tablet Take 1 tablet (40 mg total) by mouth 2 (two) times daily. 30 minutes  before breakfast 11/23/22  Yes  Barton Dubois, MD  predniSONE (DELTASONE) 10 MG tablet Take 3 tablets (30 mg total) by mouth daily with breakfast for 7 days, THEN 2 tablets (20 mg total) daily with breakfast for 7 days, THEN 1 tablet (10 mg total) daily with breakfast for 28 days. 11/23/22 01/04/23 Yes Barton Dubois, MD  rOPINIRole (REQUIP XL) 2 MG 24 hr tablet Take 2 mg by mouth at bedtime.  09/10/16  Yes [provider]  rosuvastatin (CRESTOR) 10 MG tablet Take 1 tablet (10 mg total) by mouth daily. 01/08/22  Yes Fay Records, MD  sevelamer carbonate (RENVELA) 800 MG tablet Take 800 mg by mouth See admin instructions. Take 2 tablets (1600 mg) in the morning, take 3 tablets (2400 mg) with lunch or snack,  and take 2 tablets (1600 mg) by mouth in the evening with dinner meal.   Yes [provider]  sodium chloride (OCEAN) 0.65 % SOLN nasal spray Place 1 spray into both nostrils as needed for congestion. 01/02/20  Yes Hongalgi, Lenis Dickinson, MD  SYMBICORT 160-4.5 MCG/ACT inhaler Inhale 2 puffs into the lungs daily. 04/20/22  Yes [provider]     Allergies:     Allergies  Allergen Reactions   Amlodipine Besylate Rash and Other (See Comments)    dizziness   Reglan [Metoclopramide] Other (See Comments)    hallucinations      Physical Exam:   Vitals  Blood pressure (!) 141/69, pulse 82, temperature 98.1 F (36.7 C), temperature source Oral, resp. rate 18, height 5\' 3"  (1.6 m), weight 111.4 kg, SpO2 97 %.  Physical Examination: General appearance - alert,  in no distress  Mental status - alert, oriented to person, place, and time,  Eyes - sclera anicteric Neck - supple, no JVD elevation , Chest - clear  to auscultation bilaterally, symmetrical air movement,  Heart - S1 and S2 normal, regular  Abdomen - soft, nontender, nondistended, +BS Neurological - screening mental status exam normal, neck supple without rigidity, cranial nerves II through XII intact, DTR's normal and symmetric Extremities - No  edema,  intact peripheral pulses  Skin - warm, dry MSK-left arm AV fistula with positive thrill and bruit     Data Review:    CBC Recent Labs  Lab 11/21/22 0427 11/26/22 1104  WBC 15.2* 14.5*  HGB 8.2* 8.5*  HCT 25.8* 27.2*  PLT 231 200  MCV 90.2 92.5  MCH 28.7 28.9  MCHC 31.8 31.3  RDW 15.8* 15.8*  LYMPHSABS 0.6*  --   MONOABS 0.4  --   EOSABS 0.0  --   BASOSABS 0.0  --    ------------------------------------------------------------------------------------------------------------------  Chemistries  Recent Labs  Lab 11/21/22 0427 11/26/22 1104  NA 135 138  K 4.6 3.1*  CL 95* 103  CO2 26 23  GLUCOSE 272* 219*  BUN 60* 34*  CREATININE 10.03* 7.24*  CALCIUM 8.6* 8.2*  AST 18  --   ALT 63*  --   ALKPHOS 97  --   BILITOT 0.2*  --    ------------------------------------------------------------------------------------------------------------------ estimated creatinine clearance is 10.8 mL/min (A) (by C-G formula based on SCr of 7.24 mg/dL (H)). ------------------------------------------------------------------------------------------------------------------ ------------------------------------------------------------------------------------------------------------------    Component Value Date/Time   BNP 152.0 (H) 11/19/2022 1408    Urinalysis    Component Value Date/Time   COLORURINE STRAW (A) 12/22/2018 0847   APPEARANCEUR CLEAR 12/22/2018 0847   LABSPEC 1.007 12/22/2018 0847   PHURINE 9.0 (H) 12/22/2018 0847   GLUCOSEU >=500 (A)  12/22/2018 [place] 12/22/2018 [place] 12/22/2018 [place] 12/22/2018 0847   PROTEINUR 100 (A) 12/22/2018 0847   NITRITE NEGATIVE 12/22/2018 0847   LEUKOCYTESUR NEGATIVE 12/22/2018 0847    ----------------------------------------------------------------------------------------------------------------   Imaging Results:    DG Chest Port 1 View  Result Date:  11/26/2022 CLINICAL DATA:  Chest pain EXAM: PORTABLE CHEST 1 VIEW COMPARISON:  11/18/2022 FINDINGS: Cardiomegaly again noted with vascular congestion. External artifact overlies the chest. Postoperative clips project over the left chest and axilla. No definite superimposed pneumonia, CHF, large effusion or pneumothorax. Trachea midline. IMPRESSION: Cardiomegaly with vascular congestion. Electronically Signed   By: Jerilynn Mages.  Shick M.D.   On: 11/26/2022 10:32    Radiological Exams on Admission: DG Chest Port 1 View  Result Date: 11/26/2022 CLINICAL DATA:  Chest pain EXAM: PORTABLE CHEST 1 VIEW COMPARISON:  11/18/2022 FINDINGS: Cardiomegaly again noted with vascular congestion. External artifact overlies the chest. Postoperative clips project over the left chest and axilla. No definite superimposed pneumonia, CHF, large effusion or pneumothorax. Trachea midline. IMPRESSION: Cardiomegaly with vascular congestion. Electronically Signed   By: Jerilynn Mages.  Shick M.D.   On: 11/26/2022 10:32    DVT Prophylaxis -SCD /Heparin AM Labs Ordered, also please review Full Orders  Family Communication: Admission, patients condition and plan of care including tests being ordered have been discussed with the patient  who indicate understanding and agree with the plan   Condition  -stable  Roxan Hockey M.D on 11/26/2022 at 6:17 PM Go to [email or web address] -  for contact info  [facility] - Office  [phone]

## 2022-11-26 NOTE — Progress Notes (Signed)
Prior to treatment, patient went to bathroom and had to stop and sit in chair due to increased WOB and SOB.  Patient was on 2L [place] during trransit from bed to bathroom.  Sats were in mid 80s after she made it back to bed, but did come up with coaching and deep breathing.  Will get CPAP for patient; she wants to go to bed and complained of being tired.

## 2022-11-27 ENCOUNTER — Ambulatory Visit: Payer: Medicare Other | Admitting: "Endocrinology

## 2022-11-27 ENCOUNTER — Encounter (HOSPITAL_COMMUNITY)
Admission: EM | Disposition: A | Discharge status: Home / Self Care | Payer: Self-pay | Source: Home / Self Care | Attending: Emergency Medicine

## 2022-11-27 ENCOUNTER — Encounter (HOSPITAL_COMMUNITY): Payer: Self-pay | Admitting: Cardiology

## 2022-11-27 DIAGNOSIS — R079 Chest pain, unspecified: Secondary | ICD-10-CM | POA: Diagnosis not present

## 2022-11-27 DIAGNOSIS — Z992 Dependence on renal dialysis: Secondary | ICD-10-CM | POA: Diagnosis not present

## 2022-11-27 DIAGNOSIS — N250 Renal osteodystrophy: Secondary | ICD-10-CM | POA: Diagnosis not present

## 2022-11-27 DIAGNOSIS — D631 Anemia in chronic kidney disease: Secondary | ICD-10-CM | POA: Diagnosis not present

## 2022-11-27 DIAGNOSIS — I10 Essential (primary) hypertension: Secondary | ICD-10-CM

## 2022-11-27 DIAGNOSIS — N186 End stage renal disease: Secondary | ICD-10-CM | POA: Diagnosis not present

## 2022-11-27 DIAGNOSIS — I200 Unstable angina: Secondary | ICD-10-CM | POA: Diagnosis not present

## 2022-11-27 DIAGNOSIS — R072 Precordial pain: Secondary | ICD-10-CM | POA: Diagnosis not present

## 2022-11-27 DIAGNOSIS — I2081 Angina pectoris with coronary microvascular dysfunction: Secondary | ICD-10-CM | POA: Diagnosis not present

## 2022-11-27 DIAGNOSIS — D649 Anemia, unspecified: Secondary | ICD-10-CM

## 2022-11-27 DIAGNOSIS — R0602 Shortness of breath: Secondary | ICD-10-CM | POA: Diagnosis not present

## 2022-11-27 DIAGNOSIS — E785 Hyperlipidemia, unspecified: Secondary | ICD-10-CM | POA: Diagnosis not present

## 2022-11-27 DIAGNOSIS — R0789 Other chest pain: Secondary | ICD-10-CM | POA: Diagnosis not present

## 2022-11-27 DIAGNOSIS — I120 Hypertensive chronic kidney disease with stage 5 chronic kidney disease or end stage renal disease: Secondary | ICD-10-CM | POA: Diagnosis not present

## 2022-11-27 HISTORY — PX: RIGHT/LEFT HEART CATH AND CORONARY ANGIOGRAPHY: CATH118266

## 2022-11-27 LAB — POCT I-STAT 7, (LYTES, BLD GAS, ICA,H+H)
Acid-Base Excess: 1 mmol/L (ref 0.0–2.0)
Bicarbonate: 25.9 mmol/L (ref 20.0–28.0)
Calcium, Ion: 1.16 mmol/L (ref 1.15–1.40)
HCT: 28 % — ABNORMAL LOW (ref 36.0–46.0)
Hemoglobin: 9.5 g/dL — ABNORMAL LOW (ref 12.0–15.0)
O2 Saturation: 90 %
Potassium: 3.9 mmol/L (ref 3.5–5.1)
Sodium: 137 mmol/L (ref 135–145)
TCO2: 27 mmol/L (ref 22–32)
pCO2 arterial: 41.6 mmHg (ref 32–48)
pH, Arterial: 7.403 (ref 7.35–7.45)
pO2, Arterial: 60 mmHg — ABNORMAL LOW (ref 83–108)

## 2022-11-27 LAB — POCT I-STAT EG7
Acid-Base Excess: 2 mmol/L (ref 0.0–2.0)
Acid-Base Excess: 2 mmol/L (ref 0.0–2.0)
Bicarbonate: 26.7 mmol/L (ref 20.0–28.0)
Bicarbonate: 26.6 mmol/L (ref 20.0–28.0)
Calcium, Ion: 1.15 mmol/L (ref 1.15–1.40)
Calcium, Ion: 1.16 mmol/L (ref 1.15–1.40)
HCT: 29 % — ABNORMAL LOW (ref 36.0–46.0)
HCT: 29 % — ABNORMAL LOW (ref 36.0–46.0)
Hemoglobin: 9.9 g/dL — ABNORMAL LOW (ref 12.0–15.0)
Hemoglobin: 9.9 g/dL — ABNORMAL LOW (ref 12.0–15.0)
O2 Saturation: 67 %
O2 Saturation: 64 %
Potassium: 3.8 mmol/L (ref 3.5–5.1)
Potassium: 3.9 mmol/L (ref 3.5–5.1)
Sodium: 138 mmol/L (ref 135–145)
Sodium: 138 mmol/L (ref 135–145)
TCO2: 28 mmol/L (ref 22–32)
TCO2: 28 mmol/L (ref 22–32)
pCO2, Ven: 42.5 mmHg — ABNORMAL LOW (ref 44–60)
pCO2, Ven: 42.4 mmHg — ABNORMAL LOW (ref 44–60)
pH, Ven: 7.406 (ref 7.25–7.43)
pH, Ven: 7.405 (ref 7.25–7.43)
pO2, Ven: 35 mmHg (ref 32–45)
pO2, Ven: 33 mmHg (ref 32–45)

## 2022-11-27 LAB — BASIC METABOLIC PANEL
Anion gap: 13 (ref 5–15)
BUN: 43 mg/dL — ABNORMAL HIGH (ref 6–20)
CO2: 22 mmol/L (ref 22–32)
Calcium: 9.2 mg/dL (ref 8.9–10.3)
Chloride: 107 mmol/L (ref 98–111)
Creatinine, Ser: 9.93 mg/dL — ABNORMAL HIGH (ref 0.44–1.00)
GFR, Estimated: 4 mL/min — ABNORMAL LOW (ref >60)
Glucose, Bld: 122 mg/dL — ABNORMAL HIGH (ref 70–99)
Potassium: 3.9 mmol/L (ref 3.5–5.1)
Sodium: 142 mmol/L (ref 135–145)

## 2022-11-27 LAB — CBC
HCT: 28 % — ABNORMAL LOW (ref 36.0–46.0)
Hemoglobin: 8.5 g/dL — ABNORMAL LOW (ref 12.0–15.0)
MCH: 28.4 pg (ref 26.0–34.0)
MCHC: 30.4 g/dL (ref 30.0–36.0)
MCV: 93.6 fL (ref 80.0–100.0)
Platelets: 206 10*3/uL (ref 150–400)
RBC: 2.99 MIL/uL — ABNORMAL LOW (ref 3.87–5.11)
RDW: 15.9 % — ABNORMAL HIGH (ref 11.5–15.5)
WBC: 14.6 10*3/uL — ABNORMAL HIGH (ref 4.0–10.5)
nRBC: 0 % (ref 0.0–0.2)

## 2022-11-27 LAB — TROPONIN I (HIGH SENSITIVITY): Troponin I (High Sensitivity): 167 ng/L (ref <18)

## 2022-11-27 LAB — GLUCOSE, CAPILLARY
Glucose-Capillary: 133 mg/dL — ABNORMAL HIGH (ref 70–99)
Glucose-Capillary: 111 mg/dL — ABNORMAL HIGH (ref 70–99)

## 2022-11-27 LAB — HEPATITIS B SURFACE ANTIGEN: Hepatitis B Surface Ag: NONREACTIVE

## 2022-11-27 SURGERY — RIGHT/LEFT HEART CATH AND CORONARY ANGIOGRAPHY
Anesthesia: LOCAL

## 2022-11-27 MED ORDER — HYDRALAZINE HCL 20 MG/ML IJ SOLN
INTRAMUSCULAR | Status: AC
  Filled 2022-11-27: qty 1

## 2022-11-27 MED ORDER — LIDOCAINE HCL (PF) 1 % IJ SOLN: 5.0000 mL | INTRAMUSCULAR | Status: DC | PRN

## 2022-11-27 MED ORDER — HYDRALAZINE HCL 20 MG/ML IJ SOLN
INTRAMUSCULAR | Status: DC | PRN
  Administered 2022-11-27 (×2): 10 mg via INTRAVENOUS

## 2022-11-27 MED ORDER — HYDRALAZINE HCL 20 MG/ML IJ SOLN
10.0000 mg | INTRAMUSCULAR | Status: AC | PRN
  Administered 2022-11-27: 10 mg via INTRAVENOUS

## 2022-11-27 MED ORDER — HEPARIN (PORCINE) IN NACL 1000-0.9 UT/500ML-% IV SOLN
INTRAVENOUS | Status: DC | PRN
  Administered 2022-11-27 (×2): 500 mL

## 2022-11-27 MED ORDER — CHLORHEXIDINE GLUCONATE CLOTH 2 % EX PADS
6.0000 | MEDICATED_PAD | Freq: Every day | CUTANEOUS | Status: DC
  Administered 2022-11-27: 6 via TOPICAL

## 2022-11-27 MED ORDER — FENTANYL CITRATE (PF) 100 MCG/2ML IJ SOLN
INTRAMUSCULAR | Status: DC | PRN
  Administered 2022-11-27: 25 ug via INTRAVENOUS

## 2022-11-27 MED ORDER — SODIUM CHLORIDE 0.9 % IV SOLN: 250.0000 mL | INTRAVENOUS | Status: DC | PRN

## 2022-11-27 MED ORDER — LIDOCAINE HCL (PF) 1 % IJ SOLN
INTRAMUSCULAR | Status: AC
  Filled 2022-11-27: qty 30

## 2022-11-27 MED ORDER — NITROGLYCERIN 0.2 MG/HR TD PT24
0.2000 mg | MEDICATED_PATCH | Freq: Every day | TRANSDERMAL | Status: DC
  Filled 2022-11-27: qty 1

## 2022-11-27 MED ORDER — MIDAZOLAM HCL 2 MG/2ML IJ SOLN
INTRAMUSCULAR | Status: AC
  Filled 2022-11-27: qty 2

## 2022-11-27 MED ORDER — FENTANYL CITRATE (PF) 100 MCG/2ML IJ SOLN
INTRAMUSCULAR | Status: AC
  Filled 2022-11-27: qty 2

## 2022-11-27 MED ORDER — LIDOCAINE-PRILOCAINE 2.5-2.5 % EX CREA: 1.0000 | TOPICAL_CREAM | CUTANEOUS | Status: DC | PRN

## 2022-11-27 MED ORDER — HEPARIN (PORCINE) IN NACL 1000-0.9 UT/500ML-% IV SOLN
INTRAVENOUS | Status: AC
  Filled 2022-11-27: qty 1000

## 2022-11-27 MED ORDER — SODIUM CHLORIDE 0.9% FLUSH
3.0000 mL | Freq: Two times a day (BID) | INTRAVENOUS | Status: DC
  Administered 2022-11-27 – 2022-11-28 (×2): 3 mL via INTRAVENOUS

## 2022-11-27 MED ORDER — SODIUM CHLORIDE 0.9% FLUSH: 3.0000 mL | INTRAVENOUS | Status: DC | PRN

## 2022-11-27 MED ORDER — SODIUM CHLORIDE 0.9 % IV SOLN: INTRAVENOUS | Status: DC

## 2022-11-27 MED ORDER — SODIUM CHLORIDE 0.9 % IV SOLN
INTRAVENOUS | Status: AC | PRN
  Administered 2022-11-27: 10 mL/h via INTRAVENOUS

## 2022-11-27 MED ORDER — LABETALOL HCL 5 MG/ML IV SOLN: 10.0000 mg | INTRAVENOUS | Status: AC | PRN

## 2022-11-27 MED ORDER — IOHEXOL 350 MG/ML SOLN
INTRAVENOUS | Status: DC | PRN
  Administered 2022-11-27: 55 mL

## 2022-11-27 MED ORDER — MIDAZOLAM HCL 2 MG/2ML IJ SOLN
INTRAMUSCULAR | Status: DC | PRN
  Administered 2022-11-27: 1 mg via INTRAVENOUS

## 2022-11-27 MED ORDER — PENTAFLUOROPROP-TETRAFLUOROETH EX AERO: 1.0000 | INHALATION_SPRAY | CUTANEOUS | Status: DC | PRN

## 2022-11-27 MED ORDER — LIDOCAINE HCL (PF) 1 % IJ SOLN
INTRAMUSCULAR | Status: DC | PRN
  Administered 2022-11-27: 2 mL

## 2022-11-27 SURGICAL SUPPLY — 12 items
CATH INFINITI 5FR MULTPACK ANG (CATHETERS) IMPLANT
CATH SWAN GANZ 7F STRAIGHT (CATHETERS) IMPLANT
KIT HEART LEFT (KITS) ×1 IMPLANT
KIT MICROPUNCTURE NIT STIFF (SHEATH) IMPLANT
MAT PREVALON FULL STRYKER (MISCELLANEOUS) IMPLANT
PACK CARDIAC CATHETERIZATION (CUSTOM PROCEDURE TRAY) ×1 IMPLANT
SHEATH PINNACLE 5F 10CM (SHEATH) IMPLANT
SHEATH PINNACLE 7F 10CM (SHEATH) IMPLANT
SHEATH PROBE COVER 6X72 (BAG) IMPLANT
TRANSDUCER W/STOPCOCK (MISCELLANEOUS) ×1 IMPLANT
TUBING CIL FLEX 10 FLL-RA (TUBING) ×1 IMPLANT
WIRE EMERALD 3MM-J .035X150CM (WIRE) IMPLANT

## 2022-11-27 NOTE — Consult Note (Addendum)
Cardiology Consultation   Patient ID: Patricia [name] MRN: [id number]; DOB: May 03, 1971  Admit date: 11/26/2022 Date of Consult: 11/27/2022  PCP:  Jake Samples, [place] [facility] Cardiologist:  Dorris Carnes, MD        Patient Profile:   Patricia [name] is a 51 y.o. female with a hx of HFpEF, HTN, HLD, ILD, ESRD and OSA who is being seen 11/27/2022 for the evaluation of chest pain and elevated troponin values at the request of Dr. Denton Brick.  History of Present Illness:   Ms. Gilbertson was last examined by Dr. Harrington Challenger in 04/2022 and reported her breathing had overall been stable and denied any recent chest pain or palpitations. Her dyspnea was felt to be multifactorial in the setting of COPD, ESRD and HTN. No changes were made to her medical therapy at that time.  She was recently admitted to [facility] from 12/3 - 11/23/2022 for acute hypoxic respiratory failure in the setting of pulmonary edema and obliterans bronchiolitis. She initially required BiPAP but was transitioned to 2 L nasal cannula prior to discharge. Was also noted to have hypertensive urgency on admission and required nitroglycerin drip but was transition to PO medications throughout admission. Reports also mention episodes of chest pain and troponin values were flat, peaking at 62. Echocardiogram showed a preserved EF of 70 to 75% with no regional wall motion abnormalities. She did have Grade 1 DD, normal RV function and mild AS. Her hospitalization was also complicated by acute blood loss anemia with hemoglobin 8.5 on admission. GI was consulted and she underwent EGD which showed no source of acute bleeding and was recommended to plan for outpatient colonoscopy.  She presented back to the ED on 11/26/2022 for evaluation of chest pain which started while at dialysis. In talking with the patient today, she reports having intermittent chest discomfort since undergoing fistulogram at [facility] in 09/2022.  Reports the pain  would sometimes occur with activity but can occur at rest. Reports having left-sided pectoral discomfort intermittently for the past few years as well ever since prior mastectomy but says this typically improves with adjustment of her arm or bra. Over the past few weeks, she reports more concerning episodes of chest discomfort which have occurred with walking from room to room in her home. Reports associated dyspnea as well and says she does not feel that her respiratory status significantly improved while she was hospitalized earlier this month. Yesterday while at dialysis, she developed left-sided chest discomfort and she felt like something was tightening along her left pectoral region. She tried her usual maneuvers without improvement and a nurse administered nitroglycerin with resolution of her pain. She had recurrent chest pain in the ED which again resolved with nitroglycerin. Reports overall feeling well overnight but is starting to have some chest discomfort this morning. She denies any specific orthopnea, PND or pitting edema. Says she has been at her dry weight during dialysis.  Initial labs show WBC 14.5, Hgb 8.5 (close to values earlier this month), platelets 200, Na+ 138, K+ 3.1 and creatinine 7.24. Initial Hs Troponin 183 with repeat values of 186 and 167. CXR showing cardiomegaly with vascular congestion. EKG shows NSR, HR 87 with slight ST depression along the inferior leads.   Past Medical History:  Diagnosis Date   Anemia    Ankle fracture    Arthritis    Blood transfusion without reported diagnosis    Breast cancer ([place])    Cancer ([place])  Diabetes mellitus without complication ([place])    Dialysis patient ([place])    mon, wed, friday,    End stage renal disease on dialysis ([place])    M/W/F [facility] in [place]   GERD (gastroesophageal reflux disease)    Hypertension    OSA (obstructive sleep apnea)    uses CPAP sometimes   Pneumonia    PONV (postoperative nausea and vomiting)     Wears glasses     Past Surgical History:  Procedure Laterality Date   ABDOMINAL HYSTERECTOMY     AV FISTULA PLACEMENT  11/2014   at [place] N/A 04/2021   BALLOON DILATION N/A 07/10/2016   Procedure: BALLOON DILATION;  Surgeon: Danie Binder, MD;  [facility];  Service: Endoscopy;  Laterality: N/A;  Pyloric dilation   BREAST LUMPECTOMY     CESAREAN SECTION     CHOLECYSTECTOMY     COLONOSCOPY WITH PROPOFOL N/A 09/27/2016   Dr. Gala Romney: Internal hemorrhoids repeat colonoscopy in 10 years   DILATION AND CURETTAGE OF UTERUS     ESOPHAGOGASTRODUODENOSCOPY N/A 07/10/2016   Dr.Fields- normal esophagus, gastric stenosis was found at the pylorus, gastritis on bx, normal examined duodenun   ESOPHAGOGASTRODUODENOSCOPY (EGD) WITH PROPOFOL N/A 11/21/2022   Procedure: ESOPHAGOGASTRODUODENOSCOPY (EGD) WITH PROPOFOL;  Surgeon: Harvel Quale, MD;  Location[facility];  Service: Gastroenterology;  Laterality: N/A;   EXTERNAL FIXATION REMOVAL Right 10/29/2018   Procedure: REMOVAL RIGHT ANKLE BIOMET ZIMMER EXTERNAL FIXATOR, SHORT LEG CAST APPLICATION;  Surgeon: Marybelle Killings, MD;  Location: [place];  Service: Orthopedics;  Laterality: Right;   MASTECTOMY     left sided   ORIF ANKLE FRACTURE Right 10/06/2018   Procedure: OPEN REDUCTION INTERNAL FIXATION (ORIF) RIGHT ANKLE TRIMALLEOLAR;  Surgeon: Marybelle Killings, MD;  Location: [place];  Service: Orthopedics;  Laterality: Right;     Home Medications:  Prior to Admission medications   Medication Sig Start Date End Date Taking? Authorizing Provider  acetaminophen (TYLENOL) 325 MG tablet Take 650 mg by mouth every 6 (six) hours as needed.   Yes [provider]  albuterol (PROVENTIL) (2.5 MG/3ML) 0.083% nebulizer solution Take 3 mLs (2.5 mg total) by nebulization every 6 (six) hours as needed for wheezing or shortness of breath. 11/28/17  Yes Kathie Dike, MD  albuterol (VENTOLIN HFA) 108 (90 Base)  MCG/ACT inhaler Inhale 2 puffs into the lungs every 6 (six) hours as needed for wheezing or shortness of breath. 06/28/22  Yes Chesley Mires, MD  aspirin EC 81 MG tablet Take 1 tablet (81 mg total) by mouth daily. Swallow whole. 12/07/21  Yes Fay Records, MD  azithromycin [facility]) 250 MG tablet 1 tab every Monday, Wednesday, and Friday 11/01/22  Yes Chesley Mires, MD  cetirizine (ZYRTEC) 10 MG tablet Take 10 mg by mouth daily. 03/24/22  Yes [provider]  Cholecalciferol (VITAMIN D3) 125 MCG (5000 UT) CAPS TAKE 1 CAPSULE BY MOUTH EVERY DAY 09/13/22  Yes Nida, Marella Chimes, MD  cinacalcet (SENSIPAR) 60 MG tablet Take 30 mg by mouth daily with breakfast.   Yes [provider]  dicyclomine (BENTYL) 10 MG capsule Take 1 capsule (10 mg total) by mouth 2 (two) times daily as needed. Patient taking differently: Take 10 mg by mouth 2 (two) times daily as needed for spasms. 03/20/22  Yes Annitta Needs, NP  docusate sodium (COLACE) 100 MG capsule Take 100 mg by mouth daily.   Yes [provider]  ferric citrate (AURYXIA) 1 GM 210 MG(Fe) tablet Take 2 tablets by mouth 2 (two) times daily with a meal. With Breakfast & with supper   Yes [provider]  glipiZIDE (GLUCOTROL) 10 MG tablet Take 15 mg by mouth 2 (two) times daily. 03/27/22  Yes [provider]  ipratropium (ATROVENT) 0.03 % nasal spray Place 2 sprays into both nostrils 2 (two) times daily.  06/06/19  Yes [provider]  labetalol (NORMODYNE) 100 MG tablet Take 1.5 tablets (150 mg total) by mouth 2 (two) times daily. 11/23/22 02/21/23 Yes Barton Dubois, MD  linaclotide [facility]) 290 MCG CAPS capsule Take 290 mcg by mouth daily before breakfast.   Yes [provider]  minoxidil (LONITEN) 2.5 MG tablet Take 1 tablet (2.5 mg total) by mouth 2 (two) times daily. 11/23/22  Yes Barton Dubois, MD  montelukast (SINGULAIR) 10 MG tablet TAKE 1 TABLET BY MOUTH EVERYDAY AT BEDTIME 09/13/22  Yes Chesley Mires, MD  multivitamin (RENA-VIT) TABS tablet Take 1 tablet by mouth daily.   Yes [provider]  ondansetron (ZOFRAN-ODT) 4 MG disintegrating tablet Take 1 tablet (4 mg total) by mouth as needed for nausea or vomiting. 11/23/22  Yes Barton Dubois, MD  pantoprazole (PROTONIX) 40 MG tablet Take 1 tablet (40 mg total) by mouth 2 (two) times daily. 30 minutes before breakfast 11/23/22  Yes Barton Dubois, MD  predniSONE (DELTASONE) 10 MG tablet Take 3 tablets (30 mg total) by mouth daily with breakfast for 7 days, THEN 2 tablets (20 mg total) daily with breakfast for 7 days, THEN 1 tablet (10 mg total) daily with breakfast for 28 days. 11/23/22 01/04/23 Yes Barton Dubois, MD  rOPINIRole (REQUIP XL) 2 MG 24 hr tablet Take 2 mg by mouth at bedtime.  09/10/16  Yes [provider]  rosuvastatin (CRESTOR) 10 MG tablet Take 1 tablet (10 mg total) by mouth daily. 01/08/22  Yes Fay Records, MD  sevelamer carbonate (RENVELA) 800 MG tablet Take 800 mg by mouth See admin instructions. Take 2 tablets (1600 mg) in the morning, take 3 tablets (2400 mg) with lunch or snack,  and take 2 tablets (1600 mg) by mouth in the evening with dinner meal.   Yes [provider]  sodium chloride (OCEAN) 0.65 % SOLN nasal spray Place 1 spray into both nostrils as needed for congestion. 01/02/20  Yes Hongalgi, Lenis Dickinson, MD  SYMBICORT 160-4.5 MCG/ACT inhaler Inhale 2 puffs into the lungs daily. 04/20/22  Yes [provider]    Inpatient Medications: Scheduled Meds:  aspirin EC  81 mg Oral Daily   azithromycin  250 mg Oral Q M,W,F   cholecalciferol  5,000 Units Oral Daily   cinacalcet  30 mg Oral Q breakfast   ferric citrate  420 mg Oral BID WC   heparin  5,000 Units Subcutaneous Q8H   insulin aspart  0-5 Units Subcutaneous QHS   insulin aspart  0-9 Units Subcutaneous TID WC   isosorbide dinitrate  10 mg Oral TID   labetalol  150 mg Oral BID   linaclotide  290 mcg Oral QAC breakfast    minoxidil  2.5 mg Oral BID   mometasone-formoterol  2 puff Inhalation BID   montelukast  10 mg Oral QHS   multivitamin  1 tablet Oral Daily   pantoprazole  40 mg Oral BID   predniSONE  10 mg Oral Q breakfast   rOPINIRole  1 mg Oral BID   rosuvastatin  10 mg Oral Daily  senna-docusate  2 tablet Oral QHS   sevelamer carbonate  1,600 mg Oral BID WC   sevelamer carbonate  2,400 mg Oral Q lunch   sodium chloride flush  3 mL Intravenous Q12H   sodium chloride flush  3 mL Intravenous Q12H   Continuous Infusions:  sodium chloride     PRN Meds: sodium chloride, acetaminophen **OR** acetaminophen, albuterol, bisacodyl, dicyclomine, ondansetron **OR** ondansetron (ZOFRAN) IV, ondansetron, polyethylene glycol, sodium chloride flush, traZODone  Allergies:    Allergies  Allergen Reactions   Amlodipine Besylate Rash and Other (See Comments)    dizziness   Reglan [Metoclopramide] Other (See Comments)    hallucinations     Social History:   Social History   Socioeconomic History   Marital status: Widowed    Spouse name: Not on file   Number of children: Not on file   Years of education: Not on file   Highest education level: Not on file  Occupational History   Not on file  Tobacco Use   Smoking status: Never   Smokeless tobacco: Never  Vaping Use   Vaping Use: Never used  Substance and Sexual Activity   Alcohol use: No    Alcohol/week: 0.0 standard drinks of alcohol   Drug use: No   Sexual activity: Not Currently  Other Topics Concern   Not on file  Social History Narrative   Not on file   Social Determinants of Health   Financial Resource Strain: Not on file  Food Insecurity: No Food Insecurity (11/26/2022)   Hunger Vital Sign    Worried About Running Out of Food in the Last Year: Never true    Ran Out of Food in the Last Year: Never true  Transportation Needs: No Transportation Needs (11/26/2022)   PRAPARE - Hydrologist (Medical): No     Lack of Transportation (Non-Medical): No  Physical Activity: Not on file  Stress: Not on file  Social Connections: Not on file  Intimate Partner Violence: Not At Risk (11/26/2022)   Humiliation, Afraid, Rape, and Kick questionnaire    Fear of Current or Ex-Partner: No    Emotionally Abused: No    Physically Abused: No    Sexually Abused: No    Family History:    Family History  Problem Relation Age of Onset   Diabetes Mellitus II Mother    Hypertension Mother    Heart block Mother    Hypertension Sister    Hypertension Sister    Colon cancer Neg Hx      ROS:  Please see the history of present illness.  All other ROS reviewed and negative.     Physical Exam/Data:   Vitals:   11/26/22 2048 11/27/22 0455 11/27/22 0500 11/27/22 0632  BP:  119/63    Pulse: 75 83    Resp: 18 19    Temp:  98 F (36.7 C)    TempSrc:      SpO2: 97% 94%    Weight:   110.7 kg 111.5 kg  Height:        Intake/Output Summary (Last 24 hours) at 11/27/2022 0756 Last data filed at 11/27/2022 0744 Gross per 24 hour  Intake 240 ml  Output 3 ml  Net 237 ml      11/27/2022    6:32 AM 11/27/2022    5:00 AM 11/26/2022    4:14 PM  Last 3 Weights  Weight (lbs) 245 lb 13 oz 244 lb 0.8 oz 245 lb  9.5 oz  Weight (kg) 111.5 kg 110.7 kg 111.4 kg     Body mass index is 43.54 kg/m.  General: Pleasant, obese female appearing in no acute distress. HEENT: normal Neck: no JVD Vascular: No carotid bruits; Distal pulses 2+ bilaterally Cardiac:  normal S1, S2; RRR; 2/6 SEM along RUSB.  Lungs: no wheezing or rales. Scattered rhonchi.  Abd: soft, nontender, no hepatomegaly  Ext: trace lower extremity edema Musculoskeletal:  No deformities, BUE and BLE strength normal and equal Skin: warm and dry  Neuro:  CNs 2-12 intact, no focal abnormalities noted Psych:  Normal affect   EKG:  The EKG was personally reviewed and demonstrates: NSR, HR 87 with slight ST depression along the inferior leads.    Telemetry:  Telemetry was personally reviewed and demonstrates: NSR, HR in 70's to 80's.   Relevant CV Studies:  Echocardiogram: 11/19/2022 IMPRESSIONS    1. Left ventricular ejection fraction, by estimation, is 70 to 75%. The  left ventricle has hyperdynamic function. The left ventricle has no  regional wall motion abnormalities. There is mild left ventricular  hypertrophy. Left ventricular diastolic  parameters are consistent with Grade I diastolic dysfunction (impaired  relaxation). Elevated left atrial pressure.   2. Right ventricular systolic function is normal. The right ventricular  size is normal. Tricuspid regurgitation signal is inadequate for assessing  PA pressure.   3. The mitral valve is normal in structure. No evidence of mitral valve  regurgitation. No evidence of mitral stenosis.   4. The tricuspid valve is abnormal.   5. The aortic valve is tricuspid. Aortic valve regurgitation is not  visualized. Mild aortic valve stenosis. Aortic valve mean gradient  measures 12.0 mmHg. Aortic valve peak gradient measures 27.1 mmHg. Aortic  valve area, by VTI measures 2.38 cm.   6. The inferior vena cava is dilated in size with >50% respiratory  variability, suggesting right atrial pressure of 8 mmHg.    Laboratory Data:  High Sensitivity Troponin:   Recent Labs  Lab 11/18/22 1433 11/18/22 1622 11/26/22 1104 11/26/22 1440 11/27/22 0353  TROPONINIHS 62* 59* 183* 186* 167*     Chemistry Recent Labs  Lab 11/21/22 0427 11/26/22 1104 11/27/22 0348  NA 135 138 142  K 4.6 3.1* 3.9  CL 95* 103 107  CO2 26 23 22   GLUCOSE 272* 219* 122*  BUN 60* 34* 43*  CREATININE 10.03* 7.24* 9.93*  CALCIUM 8.6* 8.2* 9.2  GFRNONAA 4* 6* 4*  ANIONGAP 14 12 13     Recent Labs  Lab 11/21/22 0427  PROT 6.4*  ALBUMIN 3.6  AST 18  ALT 63*  ALKPHOS 97  BILITOT 0.2*   Hematology Recent Labs  Lab 11/21/22 0427 11/26/22 1104 11/27/22 0348  WBC 15.2* 14.5* 14.6*  RBC 2.86*  2.94* 2.99*  HGB 8.2* 8.5* 8.5*  HCT 25.8* 27.2* 28.0*  MCV 90.2 92.5 93.6  MCH 28.7 28.9 28.4  MCHC 31.8 31.3 30.4  RDW 15.8* 15.8* 15.9*  PLT 231 200 206    Radiology/Studies:  DG Chest Port 1 View  Result Date: 11/26/2022 CLINICAL DATA:  Chest pain EXAM: PORTABLE CHEST 1 VIEW COMPARISON:  11/18/2022 FINDINGS: Cardiomegaly again noted with vascular congestion. External artifact overlies the chest. Postoperative clips project over the left chest and axilla. No definite superimposed pneumonia, CHF, large effusion or pneumothorax. Trachea midline. IMPRESSION: Cardiomegaly with vascular congestion. Electronically Signed   By: Jerilynn Mages.  Shick M.D.   On: 11/26/2022 10:32     Assessment and Plan:  1. Chest Pain with Mixed Features/ Coronary Calcification by CT/Elevated Troponin Values - Developed chest pain at rest yesterday while at dialysis but pain did improve with the use of nitroglycerin. She also reports intermittent episodes of chest discomfort with activity but this is in the setting of having prior chest wall pain and also reports having pain ever since fistulogram in 09/2022. - Her cardiac enzymes were elevated earlier this month and peaked at 80.  This admission peaked at 186 and are now trending down. EKG does show slight ST depression along the inferior leads and last for repeat tracing this morning. Echocardiogram earlier this month showed a preserved EF with no regional abnormalities as outlined above. - Given her recurrent admissions and continued intermittent episodes of chest discomfort, anticipate she would benefit from ischemic evaluation this admission. While she does have some atypical features, the sensitivity of a Lexiscan Myoview would be limited given her body habitus. Suspect she may need a cardiac catheterization for definitive evaluation and will review with Dr. Domenic Polite. Keep NPO for now.  Will order NTG patch and hold Isordil for now. Repeat 12-Lead EKG. Continue ASA,  Labetalol and Crestor.   2. HTN - BP has been variable from 110/48 - 185/84 since admission. She has been continued on Minoxidil, Labetaolol and Isordil. Will hold Isordil for now with use of NTG patch.  3. HLD - LDL was at 66 in 05/2022. Remains on Crestor 10mg  daily.   4. HFpEF - Echo last moth showed a preserved EF of 70-75% with no regional WMA but did have Grade 1 DD. Fluid status managed by HD as she no longer produces urine.   5. ESRD - Nephrology Consult pending. On HD - MWF schedule as an outpatient.   6 Anemia - Likely secondary to chronic disease. Hgb was as low as 7.9 last admission and improved to 8.2 at discharge. EGD showed no acute findings. Hgb stable at 8.5 today with no reports of active bleeding. Receives EPO/ESA as an outpatient.   7. 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 Uses supplemental oxygen at baseline and CPAP at night. Followed by Pulmonology as an outpatient.    For questions or updates, please contact [place] Please consult [email or web address] for contact info under    Signed, Erma Heritage, PA-C  11/27/2022 7:56 AM    Attending note:  Patient seen and examined.  I reviewed her records and discussed the case with Ms. Delano Metz, I agree with her above findings.  Ms. Althaus is a patient of Dr. Harrington Challenger now presenting with recurring chest tightness, predominantly left-sided and associated with shortness of breath.  She has been having the symptoms since October (underwent fistulogram in [place] around that time), mainly with activity.  She was recently hospitalized with hypoxic respiratory failure in the setting of pulmonary edema and obliterans bronchiolitis with additional concern for ILD.  She has been seen by Pulmonary.  No clear history of pulmonary hypertension.  Echocardiogram done recently showed LVEF 70 to 75% with mild diastolic dysfunction, inadequate TR for assessment of PA SP, and mild aortic stenosis with mean  gradient 12 mmHg.  Current hospitalization related to more intense chest tightness that occurred during hemodialysis requiring interruption of session with about an hour to go.  High-sensitivity troponin I levels have increased somewhat higher than on last evaluation, peak 186.  On examination this morning she appears comfortable, no active chest tightness.  No cough.  She is afebrile, heart rate in  the 80s in sinus rhythm by telemetry which I personally reviewed.  Systolic blood pressure ranging 120-160.  BMI 43.  Lungs exhibit coarse breath sounds without wheezing or crackles.  Cardiac exam with RRR and 2/6 to 3/6 systolic murmur at the base.  No pitting edema.  Pertinent lab work includes potassium 3.9, BUN 43, creatinine 9.9, WBC 14.6 (has been on course of steroids), hemoglobin 11.5, platelets 206.  Follow-up chest x-ray reports cardiomegaly with vascular congestion.  I personally reviewed her ECG from December 11 which shows sinus rhythm with nonspecific ST-T changes.  Recurring chest tightness concerning for unstable angina, high-sensitivity troponin I levels mildly increased with peak of 186.  Trend is higher than on last hospital stay.  She does have coronary artery calcification evident by CT imaging (left main and LAD), also history of HFpEF with vigorous LVEF 70 to 75% and mild aortic stenosis which should not be contributing to symptoms.  Other comorbidities include ESRD on hemodialysis, anemia of chronic disease, hypertension, hyperlipidemia, and ILD with recently documented obliterative bronchiolitis evaluated by Pulmonary.  No definite pulmonary hypertension by recent echocardiogram however TR jet difficult to assess.  Situation discussed with patient including risks and benefits.  Plan will be transfer to [facility] for diagnostic right and left heart catheterization to help guide further treatment.  BMI/body habitus is suboptimal for noninvasive coronary imaging.  Currently on  nitroglycerin patch, continue aspirin, labetalol, and Crestor for now.  Satira Sark, M.D., F.A.C.C.

## 2022-11-27 NOTE — Progress Notes (Signed)
Patient ID: Patricia [name], female   DOB: 03/08/1971, 51 y.o.   MRN: [id number] S: Patricia [name] was followed by our service until her discharge on 11/23/22.  She returned to [facility] ED on 11/26/22 from HD after she developed SSCP that awoke her from sleep.  She is currently under observation and was evaluated by Cardiology who would like further cardiac workup.  She only completed 3 hours of HD yesterday and remains volume overloaded.  We were consulted to provide HD and UF as tolerated O:BP (!) 164/82 (BP Location: Right Arm)   Pulse 83   Temp 98.4 F (36.9 C) (Oral)   Resp 19   Ht 5\' 3"  (1.6 m)   Wt 111.5 kg   SpO2 98%   BMI 43.54 kg/m   Intake/Output Summary (Last 24 hours) at 11/27/2022 1022 Last data filed at 11/27/2022 1005 Gross per 24 hour  Intake 480 ml  Output 3 ml  Net 477 ml   Intake/Output: I/O last 3 completed shifts: In: 240 [P.O.:240] Out: 2 [Stool:2]  Intake/Output this shift:  Total I/O In: 240 [P.O.:240] Out: 1 [Urine:1] Weight change:  Gen: NAD CVS: RRR Resp:CTA Abd: +BS, soft, NT/ND Ext: no edema, LUE AVF +T/B  Recent Labs  Lab 11/21/22 0427 11/26/22 1104 11/27/22 0348  NA 135 138 142  K 4.6 3.1* 3.9  CL 95* 103 107  CO2 26 23 22   GLUCOSE 272* 219* 122*  BUN 60* 34* 43*  CREATININE 10.03* 7.24* 9.93*  ALBUMIN 3.6  --   --   CALCIUM 8.6* 8.2* 9.2  AST 18  --   --   ALT 63*  --   --    Liver Function Tests: Recent Labs  Lab 11/21/22 0427  AST 18  ALT 63*  ALKPHOS 97  BILITOT 0.2*  PROT 6.4*  ALBUMIN 3.6   No results for input(s): "LIPASE", "AMYLASE" in the last 168 hours. No results for input(s): "AMMONIA" in the last 168 hours. CBC: Recent Labs  Lab 11/21/22 0427 11/26/22 1104 11/27/22 0348  WBC 15.2* 14.5* 14.6*  NEUTROABS 14.1*  --   --   HGB 8.2* 8.5* 8.5*  HCT 25.8* 27.2* 28.0*  MCV 90.2 92.5 93.6  PLT 231 200 206   Cardiac Enzymes: No results for input(s): "CKTOTAL", "CKMB", "CKMBINDEX", "TROPONINI" in the last 168  hours. CBG: Recent Labs  Lab 11/23/22 1122 11/23/22 1544 11/26/22 1655 11/26/22 2040 11/27/22 0735  GLUCAP 277* 313* 123* 206* 133*    Iron Studies: No results for input(s): "IRON", "TIBC", "TRANSFERRIN", "FERRITIN" in the last 72 hours. Studies/Results: DG Chest Port 1 View  Result Date: 11/26/2022 CLINICAL DATA:  Chest pain EXAM: PORTABLE CHEST 1 VIEW COMPARISON:  11/18/2022 FINDINGS: Cardiomegaly again noted with vascular congestion. External artifact overlies the chest. Postoperative clips project over the left chest and axilla. No definite superimposed pneumonia, CHF, large effusion or pneumothorax. Trachea midline. IMPRESSION: Cardiomegaly with vascular congestion. Electronically Signed   By: Jerilynn Mages.  Shick M.D.   On: 11/26/2022 10:32    aspirin EC  81 mg Oral Daily   azithromycin  250 mg Oral Q M,W,F   cholecalciferol  5,000 Units Oral Daily   cinacalcet  30 mg Oral Q breakfast   ferric citrate  420 mg Oral BID WC   heparin  5,000 Units Subcutaneous Q8H   insulin aspart  0-5 Units Subcutaneous QHS   insulin aspart  0-9 Units Subcutaneous TID WC   labetalol  150 mg Oral BID   linaclotide  290 mcg Oral QAC breakfast   minoxidil  2.5 mg Oral BID   mometasone-formoterol  2 puff Inhalation BID   montelukast  10 mg Oral QHS   multivitamin  1 tablet Oral Daily   nitroGLYCERIN  0.2 mg Transdermal Daily   pantoprazole  40 mg Oral BID   predniSONE  10 mg Oral Q breakfast   rOPINIRole  1 mg Oral BID   rosuvastatin  10 mg Oral Daily   senna-docusate  2 tablet Oral QHS   sevelamer carbonate  1,600 mg Oral BID WC   sevelamer carbonate  2,400 mg Oral Q lunch   sodium chloride flush  3 mL Intravenous Q12H    BMET    Component Value Date/Time   NA 142 11/27/2022 0348   K 3.9 11/27/2022 0348   CL 107 11/27/2022 0348   CO2 22 11/27/2022 0348   GLUCOSE 122 (H) 11/27/2022 0348   BUN 43 (H) 11/27/2022 0348   CREATININE 9.93 (H) 11/27/2022 0348   CREATININE 5.93 (H) 06/29/2019 1210    CALCIUM 9.2 11/27/2022 0348   GFRNONAA 4 (L) 11/27/2022 0348   GFRNONAA 8 (L) 06/29/2019 1210   GFRAA 4 (L) 08/04/2020 0452   GFRAA 9 (L) 06/29/2019 1210   CBC    Component Value Date/Time   WBC 14.6 (H) 11/27/2022 0348   RBC 2.99 (L) 11/27/2022 0348   HGB 8.5 (L) 11/27/2022 0348   HCT 28.0 (L) 11/27/2022 0348   PLT 206 11/27/2022 0348   MCV 93.6 11/27/2022 0348   MCH 28.4 11/27/2022 0348   MCHC 30.4 11/27/2022 0348   RDW 15.9 (H) 11/27/2022 0348   LYMPHSABS 0.6 (L) 11/21/2022 0427   MONOABS 0.4 11/21/2022 0427   EOSABS 0.0 11/21/2022 0427   BASOSABS 0.0 11/21/2022 0427    OP HD:  [facility] MWF From 2022  >> 4h 14min 400/600  Hep 1000+ 1800/hr  2/2.5 bath L AVF , EDW 110kg  Assessment/Plan:  Chest pain - ischemic workup per cardiology. ESRD - normally MWF at [facility].  Completed only 3 hours of HD with evidence of some volume overload.  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  Transfuse prn. ILD - known bronchiolitis obliterans.  On O2 via [place], MD [facility]

## 2022-11-27 NOTE — Interval H&P Note (Signed)
History and Physical Interval Note:  11/27/2022 3:51 PM  Kieley Ludlum  has presented today for surgery, with the diagnosis of chest pain.  The various methods of treatment have been discussed with the patient and family. After consideration of risks, benefits and other options for treatment, the patient has consented to  Procedure(s): RIGHT/LEFT HEART CATH AND CORONARY ANGIOGRAPHY (N/A) as a surgical intervention.  The patient's history has been reviewed, patient examined, no change in status, stable for surgery.  I have reviewed the patient's chart and labs.  Questions were answered to the patient's satisfaction.    Cath Lab Visit (complete for each Cath Lab visit)  Clinical Evaluation Leading to the Procedure:   ACS: Yes.    Non-ACS:    Anginal Classification: CCS III  Anti-ischemic medical therapy: Maximal Therapy (2 or more classes of medications)  Non-Invasive Test Results: No non-invasive testing performed  Prior CABG: No previous CABG       Collier [facility] 11/27/2022 3:51 PM

## 2022-11-27 NOTE — Progress Notes (Signed)
PROGRESS NOTE    Patient: Patricia [name]                            PCP: Jake Samples, PA-C                    DOB: 01-03-1971            DOA: 11/26/2022 KGM:[id number]             DOS: 11/27/2022, 10:24 AM   LOS: 0 days   Date of Service: The patient was seen and examined on 11/27/2022  Subjective:   The patient was seen and examined this morning. Stable at this time. Still complaining of chest pain but reports much improved from yesterday Otherwise no issues overnight .  Brief Narrative:   Patricia [name]  is a 51 y.o. 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,, history of chronic diastolic dysfunction CHF and CHF who was discharged on 11/23/2022 presented back to the ED from hemodialysis center with persistent chest pains -Patient tolerated about 3 hours out of his usual 4 hours of hemodialysis on 11/26/2022 before being sent to the ED with persistent chest pains -In the ED today troponin 183 repeat 186, Troponin was 62 and 59 on 11/18/2022 -EKG sinus rhythm without acute ischemic type changes -Potassium is 3.1 Hgb 8.5 which is close to patient's recent baseline -WBC is 14.5 which is close to patient's recent values -Chest x-ray consistent with cardiomegaly with vascular congestion -VQ scan was negative on 11/19/2022 No fever  Or chills  No Nausea, Vomiting or Diarrhea   Additional hx obtained from [name] and female friend at bedside..     Assessment & Plan:    Assessment and Plan:   1)Atypical chest pain----Patient tolerated about 3 hours out of his usual 4 hours of hemodialysis on 11/26/2022 before being sent to the ED with persistent chest pains -In the ED today troponin 183 repeat 186, Troponin was 62 and 59 on 11/18/2022 -EKG sinus rhythm without acute ischemic type changes -Patient was discharged on  11/23/2022 after being evaluated here in the hospital for chest pains and dyspnea at the time -Presents again with similar symptoms -Place in Observation status on  telemetry monitored unit, check serial troponins and EKG to rule out acute coronary syndrome .  If patient rules out for ACS, will need further cardiovascular risk stratification . Cardiology consult to help decide if Stress test is needed in am Versus other diagnostic modalities.   -EDP discussed case with cardiology service prior to admission -Continue aspirin isosorbide and Lipitor -VQ scan was negative on 11/19/2022 -Echo on 11/19/2022 with EF of 50 to 55%, no regional wall motion normalities and grade 1 diastolic dysfunction mild aortic stenosis noted at the time     2)ESRD--MWF-patient completed 3 out of has scheduled follow-up after hemodialysis on 11/26/2022 -Chest x-ray suggest volume overload -EDP discussed case with on-call nephrologist Dr. 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 Halford Chessman -Continue bronchodilators    5) persistent leukocytosis--WBC is 14.5 which is close to patient's recent values -Most likely related to ongoing steroid therapy for #4 above   6) hypokalemia--replace and recheck   7) chronic anemia of ESRD--- Hgb 8.5 which is close to patient's recent baseline -Stable overall -Continue iron supplementation -EPO/ESA agent per nephrology team   8)DM2-A1c was 9.5 a week ago--reflecting uncontrolled DM with hyperglycemia Use Novolog/Humalog Sliding scale insulin with  Accu-Cheks/Fingersticks as ordered    9)Morbid Obesity-/OSA -Low calorie diet, portion control and increase physical activity discussed with patient -Body mass index is 43.5 kg/m. -CPAP nightly as advised    Nutritional status:  The patient's BMI is: Body mass index is 43.54 kg/m. I agree with the assessment and plan as outlined ------------------------------------------------------------------------------------------------------------------------------------------------  DVT prophylaxis:  heparin injection 5,000 Units Start: 11/26/22 2200 SCDs Start: 11/26/22 1529 Place TED hose Start: 11/26/22 1529   Code Status:   Code Status: Full Code  Family Communication: No family member present at bedside- attempt will be made to update daily The above findings and plan of care has been discussed with patient (and family)  in detail,  they expressed understanding and agreement of above. -Advance care planning has been discussed.   Admission status:   Status is: Observation The patient remains OBS appropriate and will d/c before 2 midnights.    Plan will be transfer to [facility] for diagnostic right and left heart catheterization Today.    Procedures:   No admission procedures for hospital encounter.   Antimicrobials:  Anti-infectives (From admission, onward)    Start     Dose/Rate Route Frequency Ordered Stop   11/26/22 1545  azithromycin (ZITHROMAX) tablet 250 mg       Note to Pharmacy: 1 tab every Monday, Wednesday, and Friday     250 mg Oral Every M-W-F 11/26/22 1530          Medication:   aspirin EC  81 mg Oral Daily   azithromycin  250 mg Oral Q M,W,F   cholecalciferol  5,000 Units Oral Daily   cinacalcet  30 mg Oral Q breakfast   ferric citrate  420 mg Oral BID WC   heparin  5,000 Units Subcutaneous Q8H   insulin aspart  0-5 Units Subcutaneous QHS   insulin aspart  0-9 Units Subcutaneous TID WC   labetalol  150 mg Oral BID   linaclotide  290 mcg  Oral QAC breakfast   minoxidil  2.5 mg Oral BID   mometasone-formoterol  2 puff Inhalation BID   montelukast  10 mg Oral QHS   multivitamin  1 tablet Oral Daily   nitroGLYCERIN  0.2 mg Transdermal Daily   pantoprazole  40 mg Oral BID   predniSONE  10 mg Oral Q breakfast   rOPINIRole  1 mg Oral BID   rosuvastatin  10 mg Oral Daily   senna-docusate  2 tablet Oral QHS   sevelamer carbonate  1,600 mg Oral BID WC   sevelamer carbonate  2,400 mg Oral Q lunch   sodium chloride flush  3 mL Intravenous Q12H    acetaminophen **OR** acetaminophen, albuterol, bisacodyl, dicyclomine, ondansetron **OR** ondansetron (ZOFRAN) IV, ondansetron, polyethylene glycol, traZODone   Objective:   Vitals:   11/27/22 0500 11/27/22 0632 11/27/22 0809 11/27/22 0856  BP:    (!) 164/82  Pulse:    83  Resp:    19  Temp:    98.4 F (  36.9 C)  TempSrc:    Oral  SpO2:   98% 98%  Weight: 110.7 kg 111.5 kg    Height:        Intake/Output Summary (Last 24 hours) at 11/27/2022 1024 Last data filed at 11/27/2022 1005 Gross per 24 hour  Intake 480 ml  Output 3 ml  Net 477 ml   Filed Weights   11/26/22 1614 11/27/22 0500 11/27/22 9892  Weight: 111.4 kg 110.7 kg 111.5 kg     Examination:   Physical Exam  Constitution:  Alert, cooperative, no distress,  Appears calm and comfortable  Psychiatric:   Normal and stable mood and affect, cognition intact,   HEENT:        Normocephalic, PERRL, otherwise with in Normal limits  Chest:         Chest symmetric Cardio vascular:  S1/S2, RRR, No murmure, No Rubs or Gallops  pulmonary: Clear to auscultation bilaterally, respirations unlabored, negative wheezes / crackles Abdomen: Soft, non-tender, non-distended, bowel sounds,no masses, no organomegaly Muscular skeletal: Limited exam - in bed, able to move all 4 extremities,   Neuro: CNII-XII intact. , normal motor and sensation, reflexes intact  Extremities: No pitting edema lower extremities, +2 pulses  Skin: Dry,  warm to touch, negative for any Rashes, No open wounds Wounds: per nursing documentation   ------------------------------------------------------------------------------------------------------------------------------------------    LABs:     Latest Ref Rng & Units 11/27/2022    3:48 AM 11/26/2022   11:04 AM 11/21/2022    4:27 AM  CBC  WBC 4.0 - 10.5 K/uL 14.6  14.5  15.2   Hemoglobin 12.0 - 15.0 g/dL 8.5  8.5  8.2   Hematocrit 36.0 - 46.0 % 28.0  27.2  25.8   Platelets 150 - 400 K/uL 206  200  231       Latest Ref Rng & Units 11/27/2022    3:48 AM 11/26/2022   11:04 AM 11/21/2022    4:27 AM  CMP  Glucose 70 - 99 mg/dL 122  219  272   BUN 6 - 20 mg/dL 43  34  60   Creatinine 0.44 - 1.00 mg/dL 9.93  7.24  10.03   Sodium 135 - 145 mmol/L 142  138  135   Potassium 3.5 - 5.1 mmol/L 3.9  3.1  4.6   Chloride 98 - 111 mmol/L 107  103  95   CO2 22 - 32 mmol/L 22  23  26    Calcium 8.9 - 10.3 mg/dL 9.2  8.2  8.6   Total Protein 6.5 - 8.1 g/dL   6.4   Total Bilirubin 0.3 - 1.2 mg/dL   0.2   Alkaline Phos 38 - 126 U/L   97   AST 15 - 41 U/L   18   ALT 0 - 44 U/L   63        Micro Results Recent Results (from the past 240 hour(s))  MRSA Next Gen by PCR, Nasal     Status: None   Collection Time: 11/19/22  7:55 AM   Specimen: Nasal Mucosa; Nasal Swab  Result Value Ref Range Status   MRSA by PCR Next Gen NOT DETECTED NOT DETECTED Final    Comment: (NOTE) The GeneXpert MRSA Assay (FDA approved for NASAL specimens only), is one component of a comprehensive MRSA colonization surveillance program. It is not intended to diagnose MRSA infection nor to guide or monitor treatment for MRSA infections. Test performance is not FDA approved in patients less  than 37 years old. Performed at [facility], [address], [place], [place] [address]   SARS Coronavirus 2 by RT PCR (hospital order, performed in [facility] hospital lab) *cepheid single result test* Anterior Nasal Swab     Status:  None   Collection Time: 11/19/22  7:55 AM   Specimen: Anterior Nasal Swab  Result Value Ref Range Status   SARS Coronavirus 2 by RT PCR NEGATIVE NEGATIVE Final    Comment: (NOTE) SARS-CoV-2 target nucleic acids are NOT DETECTED.  The SARS-CoV-2 RNA is generally detectable in upper and lower respiratory specimens during the acute phase of infection. The lowest concentration of SARS-CoV-2 viral copies this assay can detect is 250 copies / mL. A negative result does not preclude SARS-CoV-2 infection and should not be used as the sole basis for treatment or other patient management decisions.  A negative result may occur with improper specimen collection / handling, submission of specimen other than nasopharyngeal swab, presence of viral mutation(s) within the areas targeted by this assay, and inadequate number of viral copies (<250 copies / mL). A negative result must be combined with clinical observations, patient history, and epidemiological information.  Fact Sheet for Patients:   https://www.patel.info/  Fact Sheet for Healthcare Providers: https://hall.com/  This test is not yet approved or  cleared by the [place] FDA and has been authorized for detection and/or diagnosis of SARS-CoV-2 by FDA under an Emergency Use Authorization (EUA).  This EUA will remain in effect (meaning this test can be used) for the duration of the COVID-19 declaration under Section 564(b)(1) of the Act, 21 U.S.C. section 360bbb-3(b)(1), unless the authorization is terminated or revoked sooner.  Performed at [facility], [address] [place],  [address]     Radiology Reports DG Chest [place] 1 View  Result Date: 11/26/2022 CLINICAL DATA:  Chest pain EXAM: PORTABLE CHEST 1 VIEW COMPARISON:  11/18/2022 FINDINGS: Cardiomegaly again noted with vascular congestion. External artifact overlies the chest. Postoperative clips project over the left chest  and axilla. No definite superimposed pneumonia, CHF, large effusion or pneumothorax. Trachea midline. IMPRESSION: Cardiomegaly with vascular congestion. Electronically Signed   By: Jerilynn Mages.  Shick M.D.   On: 11/26/2022 10:32    SIGNED: Deatra James, MD, FHM. [facility],  Pager (please use [email or web address] to page/text) Please use Epic Secure Chat for non-urgent communication (7AM-7PM)  If 7PM-7AM, please contact night-coverage [email or web address], 11/27/2022, 10:24 AM

## 2022-11-27 NOTE — TOC Progression Note (Signed)
  Transition of Care (TOC) Screening Note   Patient Details  Name: Patricia [name] Date of Birth: [birth year]   Transition of Care [facility]) CM/SW Contact:    Boneta Lucks, RN Phone Number: 11/27/2022, 1:00 PM    Transition of Care Department [facility]) has reviewed patient and no TOC needs have been identified at this time. We will continue to monitor patient advancement through interdisciplinary progression rounds. If new patient transition needs arise, please place a TOC consult.      Barriers to Discharge: Continued Medical Work up, Other (must enter comment) (Transferring to [facility])  Expected Discharge Plan and Services    [facility] for heart CATH

## 2022-11-27 NOTE — Procedures (Signed)
  HEMODIALYSIS TREATMENT NOTE:   3h treatment ordered with goal of 2-3 liters.  Cannulated with ease, hemodynamically stable, tolerating UF.  Session had to be ended early as 53 was ready to transport pt to [place] for heart catheterization.  Dr. Marval Regal was notified.   Total run time: 1 hour 59 minutes Net UF:  2 liters.   Post HD:  temp 98.1,  BP 146/64,  MAP 88,  HR 81,  R 16,  sp96% on 2L via [place].  Weight 109.3 (EDW 110) kg.  Hand-off given to [facility], LPN.   Rockwell Alexandria, RN

## 2022-11-27 NOTE — H&P (View-Only) (Signed)
Cardiology Consultation   Patient ID: [name] MRN: [id number]; DOB: 12-19-1970  Admit date: 11/26/2022 Date of Consult: 11/27/2022  PCP:  Jake Samples, [facility] Cardiologist:  Dorris Carnes, MD        Patient Profile:   Patricia [name] is a 51 y.o. female with a hx of HFpEF, HTN, HLD, ILD, ESRD and OSA who is being seen 11/27/2022 for the evaluation of chest pain and elevated troponin values at the request of Dr. Denton Brick.  History of Present Illness:   [name] was last examined by Dr. Harrington Challenger in 04/2022 and reported her breathing had overall been stable and denied any recent chest pain or palpitations. Her dyspnea was felt to be multifactorial in the setting of COPD, ESRD and HTN. No changes were made to her medical therapy at that time.  She was recently admitted to [facility] from 12/3 - 11/23/2022 for acute hypoxic respiratory failure in the setting of pulmonary edema and obliterans bronchiolitis. She initially required BiPAP but was transitioned to 2 L nasal cannula prior to discharge. Was also noted to have hypertensive urgency on admission and required nitroglycerin drip but was transition to PO medications throughout admission. Reports also mention episodes of chest pain and troponin values were flat, peaking at 62. Echocardiogram showed a preserved EF of 70 to 75% with no regional wall motion abnormalities. She did have Grade 1 DD, normal RV function and mild AS. Her hospitalization was also complicated by acute blood loss anemia with hemoglobin 8.5 on admission. GI was consulted and she underwent EGD which showed no source of acute bleeding and was recommended to plan for outpatient colonoscopy.  She presented back to the ED on 11/26/2022 for evaluation of chest pain which started while at dialysis. In talking with the patient today, she reports having intermittent chest discomfort since undergoing fistulogram at [facility] in 09/2022.  Reports the pain  would sometimes occur with activity but can occur at rest. Reports having left-sided pectoral discomfort intermittently for the past few years as well ever since prior mastectomy but says this typically improves with adjustment of her arm or bra. Over the past few weeks, she reports more concerning episodes of chest discomfort which have occurred with walking from room to room in her home. Reports associated dyspnea as well and says she does not feel that her respiratory status significantly improved while she was hospitalized earlier this month. Yesterday while at dialysis, she developed left-sided chest discomfort and she felt like something was tightening along her left pectoral region. She tried her usual maneuvers without improvement and a nurse administered nitroglycerin with resolution of her pain. She had recurrent chest pain in the ED which again resolved with nitroglycerin. Reports overall feeling well overnight but is starting to have some chest discomfort this morning. She denies any specific orthopnea, PND or pitting edema. Says she has been at her dry weight during dialysis.  Initial labs show WBC 14.5, Hgb 8.5 (close to values earlier this month), platelets 200, Na+ 138, K+ 3.1 and creatinine 7.24. Initial Hs Troponin 183 with repeat values of 186 and 167. CXR showing cardiomegaly with vascular congestion. EKG shows NSR, HR 87 with slight ST depression along the inferior leads.   Past Medical History:  Diagnosis Date   Anemia    Ankle fracture    Arthritis    Blood transfusion without reported diagnosis    Breast cancer ([place])    Cancer ([place])  Diabetes mellitus without complication ([place])    Dialysis patient ([place])    mon, wed, friday,    End stage renal disease on dialysis ([place])    M/W/F [facility] in [place]   GERD (gastroesophageal reflux disease)    Hypertension    OSA (obstructive sleep apnea)    uses CPAP sometimes   Pneumonia    PONV (postoperative nausea and vomiting)     Wears glasses     Past Surgical History:  Procedure Laterality Date   ABDOMINAL HYSTERECTOMY     AV FISTULA PLACEMENT  11/2014   at [place] N/A 04/2021   BALLOON DILATION N/A 07/10/2016   Procedure: BALLOON DILATION;  Surgeon: Danie Binder, MD;  [facility];  Service: Endoscopy;  Laterality: N/A;  Pyloric dilation   BREAST LUMPECTOMY     CESAREAN SECTION     CHOLECYSTECTOMY     COLONOSCOPY WITH PROPOFOL N/A 09/27/2016   Dr. Gala Romney: Internal hemorrhoids repeat colonoscopy in 10 years   DILATION AND CURETTAGE OF UTERUS     ESOPHAGOGASTRODUODENOSCOPY N/A 07/10/2016   Dr.Fields- normal esophagus, gastric stenosis was found at the pylorus, gastritis on bx, normal examined duodenun   ESOPHAGOGASTRODUODENOSCOPY (EGD) WITH PROPOFOL N/A 11/21/2022   Procedure: ESOPHAGOGASTRODUODENOSCOPY (EGD) WITH PROPOFOL;  Surgeon: Harvel Quale, MD;  Location[facility];  Service: Gastroenterology;  Laterality: N/A;   EXTERNAL FIXATION REMOVAL Right 10/29/2018   Procedure: REMOVAL RIGHT ANKLE BIOMET ZIMMER EXTERNAL FIXATOR, SHORT LEG CAST APPLICATION;  Surgeon: Marybelle Killings, MD;  Location: [facility];  Service: Orthopedics;  Laterality: Right;   MASTECTOMY     left sided   ORIF ANKLE FRACTURE Right 10/06/2018   Procedure: OPEN REDUCTION INTERNAL FIXATION (ORIF) RIGHT ANKLE TRIMALLEOLAR;  Surgeon: Marybelle Killings, MD;  Location: [place];  Service: Orthopedics;  Laterality: Right;     Home Medications:  Prior to Admission medications   Medication Sig Start Date End Date Taking? Authorizing Provider  acetaminophen (TYLENOL) 325 MG tablet Take 650 mg by mouth every 6 (six) hours as needed.   Yes [provider]  albuterol (PROVENTIL) (2.5 MG/3ML) 0.083% nebulizer solution Take 3 mLs (2.5 mg total) by nebulization every 6 (six) hours as needed for wheezing or shortness of breath. 11/28/17  Yes Kathie Dike, MD  albuterol (VENTOLIN HFA) 108 (90 Base)  MCG/ACT inhaler Inhale 2 puffs into the lungs every 6 (six) hours as needed for wheezing or shortness of breath. 06/28/22  Yes Chesley Mires, MD  aspirin EC 81 MG tablet Take 1 tablet (81 mg total) by mouth daily. Swallow whole. 12/07/21  Yes Fay Records, MD  azithromycin [facility]) 250 MG tablet 1 tab every Monday, Wednesday, and Friday 11/01/22  Yes Chesley Mires, MD  cetirizine (ZYRTEC) 10 MG tablet Take 10 mg by mouth daily. 03/24/22  Yes [provider]  Cholecalciferol (VITAMIN D3) 125 MCG (5000 UT) CAPS TAKE 1 CAPSULE BY MOUTH EVERY DAY 09/13/22  Yes Nida, Marella Chimes, MD  cinacalcet (SENSIPAR) 60 MG tablet Take 30 mg by mouth daily with breakfast.   Yes [provider]  dicyclomine (BENTYL) 10 MG capsule Take 1 capsule (10 mg total) by mouth 2 (two) times daily as needed. Patient taking differently: Take 10 mg by mouth 2 (two) times daily as needed for spasms. 03/20/22  Yes Annitta Needs, NP  docusate sodium (COLACE) 100 MG capsule Take 100 mg by mouth daily.   Yes [provider]  ferric citrate (AURYXIA) 1 GM 210 MG(Fe) tablet Take 2 tablets by mouth 2 (two) times daily with a meal. With Breakfast & with supper   Yes [provider]  glipiZIDE (GLUCOTROL) 10 MG tablet Take 15 mg by mouth 2 (two) times daily. 03/27/22  Yes [provider]  ipratropium (ATROVENT) 0.03 % nasal spray Place 2 sprays into both nostrils 2 (two) times daily.  06/06/19  Yes [provider]  labetalol (NORMODYNE) 100 MG tablet Take 1.5 tablets (150 mg total) by mouth 2 (two) times daily. 11/23/22 02/21/23 Yes Barton Dubois, MD  linaclotide [facility]) 290 MCG CAPS capsule Take 290 mcg by mouth daily before breakfast.   Yes [provider]  minoxidil (LONITEN) 2.5 MG tablet Take 1 tablet (2.5 mg total) by mouth 2 (two) times daily. 11/23/22  Yes Barton Dubois, MD  montelukast (SINGULAIR) 10 MG tablet TAKE 1 TABLET BY MOUTH EVERYDAY AT BEDTIME 09/13/22  Yes Chesley Mires, MD  multivitamin (RENA-VIT) TABS tablet Take 1 tablet by mouth daily.   Yes [provider]  ondansetron (ZOFRAN-ODT) 4 MG disintegrating tablet Take 1 tablet (4 mg total) by mouth as needed for nausea or vomiting. 11/23/22  Yes Barton Dubois, MD  pantoprazole (PROTONIX) 40 MG tablet Take 1 tablet (40 mg total) by mouth 2 (two) times daily. 30 minutes before breakfast 11/23/22  Yes Barton Dubois, MD  predniSONE (DELTASONE) 10 MG tablet Take 3 tablets (30 mg total) by mouth daily with breakfast for 7 days, THEN 2 tablets (20 mg total) daily with breakfast for 7 days, THEN 1 tablet (10 mg total) daily with breakfast for 28 days. 11/23/22 01/04/23 Yes Barton Dubois, MD  rOPINIRole (REQUIP XL) 2 MG 24 hr tablet Take 2 mg by mouth at bedtime.  09/10/16  Yes [provider]  rosuvastatin (CRESTOR) 10 MG tablet Take 1 tablet (10 mg total) by mouth daily. 01/08/22  Yes Fay Records, MD  sevelamer carbonate (RENVELA) 800 MG tablet Take 800 mg by mouth See admin instructions. Take 2 tablets (1600 mg) in the morning, take 3 tablets (2400 mg) with lunch or snack,  and take 2 tablets (1600 mg) by mouth in the evening with dinner meal.   Yes [provider]  sodium chloride (OCEAN) 0.65 % SOLN nasal spray Place 1 spray into both nostrils as needed for congestion. 01/02/20  Yes Hongalgi, Lenis Dickinson, MD  SYMBICORT 160-4.5 MCG/ACT inhaler Inhale 2 puffs into the lungs daily. 04/20/22  Yes [provider]    Inpatient Medications: Scheduled Meds:  aspirin EC  81 mg Oral Daily   azithromycin  250 mg Oral Q M,W,F   cholecalciferol  5,000 Units Oral Daily   cinacalcet  30 mg Oral Q breakfast   ferric citrate  420 mg Oral BID WC   heparin  5,000 Units Subcutaneous Q8H   insulin aspart  0-5 Units Subcutaneous QHS   insulin aspart  0-9 Units Subcutaneous TID WC   isosorbide dinitrate  10 mg Oral TID   labetalol  150 mg Oral BID   linaclotide  290 mcg Oral QAC breakfast    minoxidil  2.5 mg Oral BID   mometasone-formoterol  2 puff Inhalation BID   montelukast  10 mg Oral QHS   multivitamin  1 tablet Oral Daily   pantoprazole  40 mg Oral BID   predniSONE  10 mg Oral Q breakfast   rOPINIRole  1 mg Oral BID   rosuvastatin  10 mg Oral Daily  senna-docusate  2 tablet Oral QHS   sevelamer carbonate  1,600 mg Oral BID WC   sevelamer carbonate  2,400 mg Oral Q lunch   sodium chloride flush  3 mL Intravenous Q12H   sodium chloride flush  3 mL Intravenous Q12H   Continuous Infusions:  sodium chloride     PRN Meds: sodium chloride, acetaminophen **OR** acetaminophen, albuterol, bisacodyl, dicyclomine, ondansetron **OR** ondansetron (ZOFRAN) IV, ondansetron, polyethylene glycol, sodium chloride flush, traZODone  Allergies:    Allergies  Allergen Reactions   Amlodipine Besylate Rash and Other (See Comments)    dizziness   Reglan [Metoclopramide] Other (See Comments)    hallucinations     Social History:   Social History   Socioeconomic History   Marital status: Widowed    Spouse name: Not on file   Number of children: Not on file   Years of education: Not on file   Highest education level: Not on file  Occupational History   Not on file  Tobacco Use   Smoking status: Never   Smokeless tobacco: Never  Vaping Use   Vaping Use: Never used  Substance and Sexual Activity   Alcohol use: No    Alcohol/week: 0.0 standard drinks of alcohol   Drug use: No   Sexual activity: Not Currently  Other Topics Concern   Not on file  Social History Narrative   Not on file   Social Determinants of Health   Financial Resource Strain: Not on file  Food Insecurity: No Food Insecurity (11/26/2022)   Hunger Vital Sign    Worried About Running Out of Food in the Last Year: Never true    Ran Out of Food in the Last Year: Never true  Transportation Needs: No Transportation Needs (11/26/2022)   PRAPARE - Hydrologist (Medical): No     Lack of Transportation (Non-Medical): No  Physical Activity: Not on file  Stress: Not on file  Social Connections: Not on file  Intimate Partner Violence: Not At Risk (11/26/2022)   Humiliation, Afraid, Rape, and Kick questionnaire    Fear of Current or Ex-Partner: No    Emotionally Abused: No    Physically Abused: No    Sexually Abused: No    Family History:    Family History  Problem Relation Age of Onset   Diabetes Mellitus II Mother    Hypertension Mother    Heart block Mother    Hypertension Sister    Hypertension Sister    Colon cancer Neg Hx      ROS:  Please see the history of present illness.  All other ROS reviewed and negative.     Physical Exam/Data:   Vitals:   11/26/22 2048 11/27/22 0455 11/27/22 0500 11/27/22 0632  BP:  119/63    Pulse: 75 83    Resp: 18 19    Temp:  98 F (36.7 C)    TempSrc:      SpO2: 97% 94%    Weight:   110.7 kg 111.5 kg  Height:        Intake/Output Summary (Last 24 hours) at 11/27/2022 0756 Last data filed at 11/27/2022 0744 Gross per 24 hour  Intake 240 ml  Output 3 ml  Net 237 ml      11/27/2022    6:32 AM 11/27/2022    5:00 AM 11/26/2022    4:14 PM  Last 3 Weights  Weight (lbs) 245 lb 13 oz 244 lb 0.8 oz 245 lb  9.5 oz  Weight (kg) 111.5 kg 110.7 kg 111.4 kg     Body mass index is 43.54 kg/m.  General: Pleasant, obese female appearing in no acute distress. HEENT: normal Neck: no JVD Vascular: No carotid bruits; Distal pulses 2+ bilaterally Cardiac:  normal S1, S2; RRR; 2/6 SEM along RUSB.  Lungs: no wheezing or rales. Scattered rhonchi.  Abd: soft, nontender, no hepatomegaly  Ext: trace lower extremity edema Musculoskeletal:  No deformities, BUE and BLE strength normal and equal Skin: warm and dry  Neuro:  CNs 2-12 intact, no focal abnormalities noted Psych:  Normal affect   EKG:  The EKG was personally reviewed and demonstrates: NSR, HR 87 with slight ST depression along the inferior leads.    Telemetry:  Telemetry was personally reviewed and demonstrates: NSR, HR in 70's to 80's.   Relevant CV Studies:  Echocardiogram: 11/19/2022 IMPRESSIONS    1. Left ventricular ejection fraction, by estimation, is 70 to 75%. The  left ventricle has hyperdynamic function. The left ventricle has no  regional wall motion abnormalities. There is mild left ventricular  hypertrophy. Left ventricular diastolic  parameters are consistent with Grade I diastolic dysfunction (impaired  relaxation). Elevated left atrial pressure.   2. Right ventricular systolic function is normal. The right ventricular  size is normal. Tricuspid regurgitation signal is inadequate for assessing  PA pressure.   3. The mitral valve is normal in structure. No evidence of mitral valve  regurgitation. No evidence of mitral stenosis.   4. The tricuspid valve is abnormal.   5. The aortic valve is tricuspid. Aortic valve regurgitation is not  visualized. Mild aortic valve stenosis. Aortic valve mean gradient  measures 12.0 mmHg. Aortic valve peak gradient measures 27.1 mmHg. Aortic  valve area, by VTI measures 2.38 cm.   6. The inferior vena cava is dilated in size with >50% respiratory  variability, suggesting right atrial pressure of 8 mmHg.    Laboratory Data:  High Sensitivity Troponin:   Recent Labs  Lab 11/18/22 1433 11/18/22 1622 11/26/22 1104 11/26/22 1440 11/27/22 0353  TROPONINIHS 62* 59* 183* 186* 167*     Chemistry Recent Labs  Lab 11/21/22 0427 11/26/22 1104 11/27/22 0348  NA 135 138 142  K 4.6 3.1* 3.9  CL 95* 103 107  CO2 26 23 22   GLUCOSE 272* 219* 122*  BUN 60* 34* 43*  CREATININE 10.03* 7.24* 9.93*  CALCIUM 8.6* 8.2* 9.2  GFRNONAA 4* 6* 4*  ANIONGAP 14 12 13     Recent Labs  Lab 11/21/22 0427  PROT 6.4*  ALBUMIN 3.6  AST 18  ALT 63*  ALKPHOS 97  BILITOT 0.2*   Hematology Recent Labs  Lab 11/21/22 0427 11/26/22 1104 11/27/22 0348  WBC 15.2* 14.5* 14.6*  RBC 2.86*  2.94* 2.99*  HGB 8.2* 8.5* 8.5*  HCT 25.8* 27.2* 28.0*  MCV 90.2 92.5 93.6  MCH 28.7 28.9 28.4  MCHC 31.8 31.3 30.4  RDW 15.8* 15.8* 15.9*  PLT 231 200 206    Radiology/Studies:  DG Chest Port 1 View  Result Date: 11/26/2022 CLINICAL DATA:  Chest pain EXAM: PORTABLE CHEST 1 VIEW COMPARISON:  11/18/2022 FINDINGS: Cardiomegaly again noted with vascular congestion. External artifact overlies the chest. Postoperative clips project over the left chest and axilla. No definite superimposed pneumonia, CHF, large effusion or pneumothorax. Trachea midline. IMPRESSION: Cardiomegaly with vascular congestion. Electronically Signed   By: Jerilynn Mages.  Shick M.D.   On: 11/26/2022 10:32     Assessment and Plan:  1. Chest Pain with Mixed Features/ Coronary Calcification by CT/Elevated Troponin Values - Developed chest pain at rest yesterday while at dialysis but pain did improve with the use of nitroglycerin. She also reports intermittent episodes of chest discomfort with activity but this is in the setting of having prior chest wall pain and also reports having pain ever since fistulogram in 09/2022. - Her cardiac enzymes were elevated earlier this month and peaked at 81.  This admission peaked at 186 and are now trending down. EKG does show slight ST depression along the inferior leads and last for repeat tracing this morning. Echocardiogram earlier this month showed a preserved EF with no regional abnormalities as outlined above. - Given her recurrent admissions and continued intermittent episodes of chest discomfort, anticipate she would benefit from ischemic evaluation this admission. While she does have some atypical features, the sensitivity of a Lexiscan Myoview would be limited given her body habitus. Suspect she may need a cardiac catheterization for definitive evaluation and will review with Dr. Domenic Polite. Keep NPO for now.  Will order NTG patch and hold Isordil for now. Repeat 12-Lead EKG. Continue ASA,  Labetalol and Crestor.   2. HTN - BP has been variable from 110/48 - 185/84 since admission. She has been continued on Minoxidil, Labetaolol and Isordil. Will hold Isordil for now with use of NTG patch.  3. HLD - LDL was at 66 in 05/2022. Remains on Crestor 10mg  daily.   4. HFpEF - Echo last moth showed a preserved EF of 70-75% with no regional WMA but did have Grade 1 DD. Fluid status managed by HD as she no longer produces urine.   5. ESRD - Nephrology Consult pending. On HD - MWF schedule as an outpatient.   6 Anemia - Likely secondary to chronic disease. Hgb was as low as 7.9 last admission and improved to 8.2 at discharge. EGD showed no acute findings. Hgb stable at 8.5 today with no reports of active bleeding. Receives EPO/ESA as an outpatient.   7. 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 Uses supplemental oxygen at baseline and CPAP at night. Followed by Pulmonology as an outpatient.    For questions or updates, please contact [place] Please consult [email or web address] for contact info under    Signed, Erma Heritage, PA-C  11/27/2022 7:56 AM    Attending note:  Patient seen and examined.  I reviewed her records and discussed the case with Ms. Delano Metz, I agree with her above findings.  Ms. Finnigan is a patient of Dr. Harrington Challenger now presenting with recurring chest tightness, predominantly left-sided and associated with shortness of breath.  She has been having the symptoms since October (underwent fistulogram in [place] around that time), mainly with activity.  She was recently hospitalized with hypoxic respiratory failure in the setting of pulmonary edema and obliterans bronchiolitis with additional concern for ILD.  She has been seen by Pulmonary.  No clear history of pulmonary hypertension.  Echocardiogram done recently showed LVEF 70 to 75% with mild diastolic dysfunction, inadequate TR for assessment of PA SP, and mild aortic stenosis with mean  gradient 12 mmHg.  Current hospitalization related to more intense chest tightness that occurred during hemodialysis requiring interruption of session with about an hour to go.  High-sensitivity troponin I levels have increased somewhat higher than on last evaluation, peak 186.  On examination this morning she appears comfortable, no active chest tightness.  No cough.  She is afebrile, heart rate in  the 80s in sinus rhythm by telemetry which I personally reviewed.  Systolic blood pressure ranging 120-160.  BMI 43.  Lungs exhibit coarse breath sounds without wheezing or crackles.  Cardiac exam with RRR and 2/6 to 3/6 systolic murmur at the base.  No pitting edema.  Pertinent lab work includes potassium 3.9, BUN 43, creatinine 9.9, WBC 14.6 (has been on course of steroids), hemoglobin 11.5, platelets 206.  Follow-up chest x-ray reports cardiomegaly with vascular congestion.  I personally reviewed her ECG from December 11 which shows sinus rhythm with nonspecific ST-T changes.  Recurring chest tightness concerning for unstable angina, high-sensitivity troponin I levels mildly increased with peak of 186.  Trend is higher than on last hospital stay.  She does have coronary artery calcification evident by CT imaging (left main and LAD), also history of HFpEF with vigorous LVEF 70 to 75% and mild aortic stenosis which should not be contributing to symptoms.  Other comorbidities include ESRD on hemodialysis, anemia of chronic disease, hypertension, hyperlipidemia, and ILD with recently documented obliterative bronchiolitis evaluated by Pulmonary.  No definite pulmonary hypertension by recent echocardiogram however TR jet difficult to assess.  Situation discussed with patient including risks and benefits.  Plan will be transfer to [facility] for diagnostic right and left heart catheterization to help guide further treatment.  BMI/body habitus is suboptimal for noninvasive coronary imaging.  Currently on  nitroglycerin patch, continue aspirin, labetalol, and Crestor for now.  Satira Sark, M.D., F.A.C.C.

## 2022-11-27 NOTE — Hospital Course (Addendum)
Patricia [name]  is a 51 y.o. 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,, history of chronic diastolic dysfunction CHF and CHF who was discharged on 11/23/2022 presented back to the ED from hemodialysis center with persistent chest pains -Patient tolerated about 3 hours out of his usual 4 hours of hemodialysis on 11/26/2022 before being sent to the ED with persistent chest pains -In the ED today troponin 183 repeat 186, Troponin was 62 and 59 on 11/18/2022 -EKG sinus rhythm without acute ischemic type changes -Potassium is 3.1 Hgb 8.5 which is close to patient's recent baseline -WBC is 14.5 which is close to patient's recent values -Chest x-ray consistent with cardiomegaly with vascular congestion -VQ scan was negative on 11/19/2022 No fever  Or chills  No Nausea, Vomiting or Diarrhea   Additional hx obtained from [name] and female friend at bedside..     Assessment & Plan:    Assessment and Plan:   1)Atypical chest pain--- -Still having chest pain, much improved from yesterday -Status post cardiology evaluation recommending cardiac catheterization and transfer to [facility] today  -Cardiology will follow closely   -Patient tolerated about 3 hours out of his usual 4 hours of hemodialysis on 11/26/2022 before being sent to the ED with persistent chest pains -Troponin 183 repeat 186,>> 167  -EKG sinus rhythm without acute ischemic type changes  -Patient was discharged on 11/23/2022 after being evaluated here in the hospital for chest pains and dyspnea at the time   -Continue aspirin isosorbide and Lipitor -VQ scan was negative on 11/19/2022 -Echo on 11/19/2022 with EF of 50 to 55%, no regional wall motion normalities and grade 1 diastolic dysfunction mild aortic stenosis noted at the time     2)ESRD--MWF -patient completed 3 out of  has scheduled follow-up after hemodialysis on 11/26/2022 -Chest x-ray suggest volume overload -EDP discussed case with on-call nephrologist Dr. 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 Halford Chessman -Continue bronchodilators    5) persistent leukocytosis --WBC is 14.5 which is close to patient's recent values -Most likely related to ongoing steroid therapy as above   6) hypokalemia --replace and recheck   7) chronic anemia of ESRD- -- Hgb 8.5 which is close to patient's recent baseline -Stable overall -Continue iron supplementation -EPO/ESA agent per nephrology team   8)DM2-A1c was 9.5 a week ago --reflecting uncontrolled DM with hyperglycemia Use Novolog/Humalog Sliding scale insulin with Accu-Cheks/Fingersticks as ordered    9)Morbid Obesity-/OSA -Low calorie diet, portion control and increase physical activity discussed with patient -Body mass index is 43.5 kg/m. -CPAP nightly as advised

## 2022-11-27 NOTE — Progress Notes (Signed)
Patient being transferred to [facility] for cardiac catheterization, Alert and oriented times 4, Transported via [facility]  to  awaiting facility, Plan of care on going.

## 2022-11-28 ENCOUNTER — Ambulatory Visit: Payer: Self-pay | Admitting: *Deleted

## 2022-11-28 ENCOUNTER — Encounter: Payer: Self-pay | Admitting: *Deleted

## 2022-11-28 DIAGNOSIS — R0789 Other chest pain: Secondary | ICD-10-CM | POA: Diagnosis not present

## 2022-11-28 DIAGNOSIS — R072 Precordial pain: Secondary | ICD-10-CM | POA: Diagnosis not present

## 2022-11-28 DIAGNOSIS — J4481 Bronchiolitis obliterans and bronchiolitis obliterans syndrome: Secondary | ICD-10-CM

## 2022-11-28 DIAGNOSIS — I5020 Unspecified systolic (congestive) heart failure: Secondary | ICD-10-CM

## 2022-11-28 DIAGNOSIS — N250 Renal osteodystrophy: Secondary | ICD-10-CM | POA: Diagnosis not present

## 2022-11-28 DIAGNOSIS — Z794 Long term (current) use of insulin: Secondary | ICD-10-CM | POA: Diagnosis not present

## 2022-11-28 DIAGNOSIS — N186 End stage renal disease: Secondary | ICD-10-CM | POA: Diagnosis not present

## 2022-11-28 DIAGNOSIS — E1122 Type 2 diabetes mellitus with diabetic chronic kidney disease: Secondary | ICD-10-CM

## 2022-11-28 DIAGNOSIS — I120 Hypertensive chronic kidney disease with stage 5 chronic kidney disease or end stage renal disease: Secondary | ICD-10-CM | POA: Diagnosis not present

## 2022-11-28 DIAGNOSIS — D631 Anemia in chronic kidney disease: Secondary | ICD-10-CM | POA: Diagnosis not present

## 2022-11-28 DIAGNOSIS — R079 Chest pain, unspecified: Secondary | ICD-10-CM | POA: Diagnosis not present

## 2022-11-28 DIAGNOSIS — Z992 Dependence on renal dialysis: Secondary | ICD-10-CM | POA: Diagnosis not present

## 2022-11-28 LAB — GLUCOSE, CAPILLARY
Glucose-Capillary: 235 mg/dL — ABNORMAL HIGH (ref 70–99)
Glucose-Capillary: 209 mg/dL — ABNORMAL HIGH (ref 70–99)
Glucose-Capillary: 228 mg/dL — ABNORMAL HIGH (ref 70–99)
Glucose-Capillary: 273 mg/dL — ABNORMAL HIGH (ref 70–99)

## 2022-11-28 LAB — HEPATITIS B SURFACE ANTIBODY, QUANTITATIVE: Hep B S AB Quant (Post): 43.8 m[IU]/mL (ref >9.9)

## 2022-11-28 MED ORDER — NITROGLYCERIN 0.4 MG SL SUBL: 0.4000 mg | SUBLINGUAL_TABLET | SUBLINGUAL | 3 refills | Status: DC | PRN

## 2022-11-28 NOTE — Progress Notes (Signed)
Discharge instructions provided to patient. All medications, follow up appointments, and discharge instructions discussed. IV out. Monitor off CCMD notified. Discharging home.

## 2022-11-28 NOTE — Care Management (Signed)
  Transition of Care (TOC) Screening Note   Patient Details  Name: Patricia [name] Date of Birth: 07/14/1971   Transition of Care [facility]) CM/SW Contact:    Bethena Roys, RN Phone Number: 11/28/2022, 1:27 PM    Transition of Care Department [facility]) has reviewed the patient and no TOC needs have been identified at this time. Patient has oxygen with [employer] at 2 Liters. Patient has portable tank for travel home. No further needs identified at this time.

## 2022-11-28 NOTE — Progress Notes (Signed)
Progress Note  Patient Name: Patricia [name] Date of Encounter: 11/28/2022  Primary Cardiologist: Dorris Carnes, MD   Subjective   Patient seen and examined at her bedside. She is on nasal cannula reports that she some shortness of breath. She is status post [facility] yesterday.   Inpatient Medications    Scheduled Meds:  aspirin EC  81 mg Oral Daily   azithromycin  250 mg Oral Q M,W,F   cholecalciferol  5,000 Units Oral Daily   cinacalcet  30 mg Oral Q breakfast   ferric citrate  420 mg Oral BID WC   heparin  5,000 Units Subcutaneous Q8H   insulin aspart  0-5 Units Subcutaneous QHS   insulin aspart  0-9 Units Subcutaneous TID WC   labetalol  150 mg Oral BID   linaclotide  290 mcg Oral QAC breakfast   minoxidil  2.5 mg Oral BID   mometasone-formoterol  2 puff Inhalation BID   montelukast  10 mg Oral QHS   multivitamin  1 tablet Oral Daily   nitroGLYCERIN  0.2 mg Transdermal Daily   pantoprazole  40 mg Oral BID   predniSONE  10 mg Oral Q breakfast   rOPINIRole  1 mg Oral BID   rosuvastatin  10 mg Oral Daily   senna-docusate  2 tablet Oral QHS   sevelamer carbonate  1,600 mg Oral BID WC   sevelamer carbonate  2,400 mg Oral Q lunch   sodium chloride flush  3 mL Intravenous Q12H   sodium chloride flush  3 mL Intravenous Q12H   sodium chloride flush  3 mL Intravenous Q12H   Continuous Infusions:  sodium chloride     sodium chloride     PRN Meds: sodium chloride, sodium chloride, acetaminophen **OR** acetaminophen, albuterol, bisacodyl, dicyclomine, lidocaine (PF), lidocaine-prilocaine, ondansetron **OR** ondansetron (ZOFRAN) IV, ondansetron, pentafluoroprop-tetrafluoroeth, polyethylene glycol, sodium chloride flush, sodium chloride flush, traZODone   Vital Signs    Vitals:   11/27/22 2334 11/28/22 0608 11/28/22 0728 11/28/22 0802  BP: (!) 123/50 (!) 143/65  (!) 143/65  Pulse: 86 80 82 78  Resp:   18 18  Temp: 98 F (36.7 C) 97.9 F (36.6 C)  98 F (36.7 C)  TempSrc: Oral  Oral  Oral  SpO2:  98% 97% 97%  Weight:  109 kg    Height:        Intake/Output Summary (Last 24 hours) at 11/28/2022 0815 Last data filed at 11/27/2022 1400 Gross per 24 hour  Intake 240 ml  Output 2000 ml  Net -1760 ml   Filed Weights   11/27/22 1400 11/27/22 2113 11/28/22 8338  Weight: 109.3 kg 108.8 kg 109 kg    Telemetry    Sinus rhythm - Personally Reviewed  ECG    None today  - Personally Reviewed  Physical Exam    General: Comfortable Head: Atraumatic, normal size  Eyes: PEERLA, EOMI  Neck: Supple, normal JVD Cardiac: Normal S1, S2; RRR; no murmurs, rubs, or gallops Lungs: Clear to auscultation bilaterally Abd: Soft, nontender, no hepatomegaly  Ext: warm, no edema Musculoskeletal: No deformities, BUE and BLE strength normal and equal Skin: Warm and dry, no rashes   Neuro: Alert and oriented to person, place, time, and situation, CNII-XII grossly intact, no focal deficits  Psych: Normal mood and affect   Labs    Chemistry Recent Labs  Lab 11/26/22 1104 11/27/22 0348 11/27/22 1641 11/27/22 1645  NA 138 142 138  138 137  K 3.1* 3.9 3.9  3.8  3.9  CL 103 107  --   --   CO2 23 22  --   --   GLUCOSE 219* 122*  --   --   BUN 34* 43*  --   --   CREATININE 7.24* 9.93*  --   --   CALCIUM 8.2* 9.2  --   --   GFRNONAA 6* 4*  --   --   ANIONGAP 12 13  --   --      Hematology Recent Labs  Lab 11/26/22 1104 11/27/22 0348 11/27/22 1641 11/27/22 1645  WBC 14.5* 14.6*  --   --   RBC 2.94* 2.99*  --   --   HGB 8.5* 8.5* 9.9*  9.9* 9.5*  HCT 27.2* 28.0* 29.0*  29.0* 28.0*  MCV 92.5 93.6  --   --   MCH 28.9 28.4  --   --   MCHC 31.3 30.4  --   --   RDW 15.8* 15.9*  --   --   PLT 200 206  --   --     Cardiac EnzymesNo results for input(s): "TROPONINI" in the last 168 hours. No results for input(s): "TROPIPOC" in the last 168 hours.   BNPNo results for input(s): "BNP", "PROBNP" in the last 168 hours.   DDimer No results for input(s): "DDIMER" in  the last 168 hours.   Radiology    CARDIAC CATHETERIZATION  Result Date: 94/76/5465   LV end diastolic pressure is mildly elevated.   Hemodynamic findings consistent with moderate pulmonary hypertension. Normal coronary anatomy Mildly elevated LV filling pressures. LVEDP 18 mm Hg. PCWP 30/28 mean of 22 mm Hg Moderate pulmonary HTN PAP 76/21 mean 43 mm Hg HIgh cardiac output by Fick due to AV shunt. Plan: medical management.   DG Chest Port 1 View  Result Date: 11/26/2022 CLINICAL DATA:  Chest pain EXAM: PORTABLE CHEST 1 VIEW COMPARISON:  11/18/2022 FINDINGS: Cardiomegaly again noted with vascular congestion. External artifact overlies the chest. Postoperative clips project over the left chest and axilla. No definite superimposed pneumonia, CHF, large effusion or pneumothorax. Trachea midline. IMPRESSION: Cardiomegaly with vascular congestion. Electronically Signed   By: Jerilynn Mages.  Shick M.D.   On: 11/26/2022 10:32    Cardiac Studies   Cardiac catheterization 03/54/6568  LV end diastolic pressure is mildly elevated.   Hemodynamic findings consistent with moderate pulmonary hypertension.   Normal coronary anatomy Mildly elevated LV filling pressures. LVEDP 18 mm Hg. PCWP 30/28 mean of 22 mm Hg Moderate pulmonary HTN PAP 76/21 mean 43 mm Hg HIgh cardiac output by Fick due to AV shunt.  Plan: medical management.    Echocardiogram 11/2022 Sonographer Comments: Patient is obese.  IMPRESSIONS   1. Left ventricular ejection fraction, by estimation, is 70 to 75%. The left ventricle has hyperdynamic function. The left ventricle has no regional wall motion abnormalities. There is mild left ventricular hypertrophy. Left ventricular diastolic  parameters are consistent with Grade I diastolic dysfunction (impaired relaxation). Elevated left atrial pressure.   2. Right ventricular systolic function is normal. The right ventricular size is normal. Tricuspid regurgitation signal is inadequate for assessing PA  pressure.   3. The mitral valve is normal in structure. No evidence of mitral valve regurgitation. No evidence of mitral stenosis.   4. The tricuspid valve is abnormal.   5. The aortic valve is tricuspid. Aortic valve regurgitation is not visualized. Mild aortic valve stenosis. Aortic valve mean gradient measures 12.0 mmHg. Aortic valve peak gradient measures 27.1 mmHg.  Aortic valve area, by VTI measures 2.38 cm.   6. The inferior vena cava is dilated in size with >50% respiratory variability, suggesting right atrial pressure of 8 mmHg.   FINDINGS   Left Ventricle: Left ventricular ejection fraction, by estimation, is 70 to 75%. The left ventricle has hyperdynamic function. The left ventricle has no regional wall motion abnormalities. The left ventricular internal cavity size was normal in size.  There is mild left ventricular hypertrophy. Left ventricular diastolic parameters are consistent with Grade I diastolic dysfunction (impaired relaxation). Elevated left atrial pressure.   Right Ventricle: The right ventricular size is normal. Right vetricular wall thickness was not well visualized. Right ventricular systolic function is normal. Tricuspid regurgitation signal is inadequate for assessing PA pressure.   Left Atrium: Left atrial size was normal in size.   Right Atrium: Right atrial size was normal in size.   Pericardium: There is no evidence of pericardial effusion.   Mitral Valve: The mitral valve is normal in structure. There is mild thickening of the mitral valve leaflet(s). There is mild calcification of the mitral valve leaflet(s). Mild mitral annular calcification. No evidence of mitral valve regurgitation. No  evidence of mitral valve stenosis. MV peak gradient, 9.5 mmHg. The mean mitral valve gradient is 4.0 mmHg.   Tricuspid Valve: The tricuspid valve is abnormal. Tricuspid valve regurgitation is mild . No evidence of tricuspid stenosis.   Aortic Valve: The aortic valve is  tricuspid. Aortic valve regurgitation is not visualized. Mild aortic stenosis is present. Aortic valve mean gradient measures 12.0 mmHg. Aortic valve peak gradient measures 27.1 mmHg. Aortic valve area, by VTI measures  2.38 cm.   Pulmonic Valve: The pulmonic valve was not well visualized. Pulmonic valve regurgitation is not visualized. No evidence of pulmonic stenosis.   Aorta: The aortic root is normal in size and structure.   Venous: The inferior vena cava is dilated in size with greater than 50% respiratory variability, suggesting right atrial pressure of 8 mmHg.   IAS/Shunts: No atrial level shunt detected by color flow Doppler.       Patient Profile     51 y.o. female with hx of f HFpEF, HTN, HLD, ILD, ESRD and OSA .   Assessment & Plan    Chest pain  Hypertension  Hyperlipidemia  Heart failure with preserved ejection fraction  ESRD Anemia  ILD  Status post heart cath yesterday - No CAD. Evidence of moderate pulmonary hypertension.  She is still short of breath which is also suspected to be from her underlying ILD.  She admits to not be compliant with her CPAP - she plans to do so.  She need follow up with the advanced heart failure clinic for her pulmonary hypertension.  Continue her crestor.  Hgb stable.       For questions or updates, please contact [facility] Please consult [email or web address] for contact info under Cardiology/STEMI.      Signed, Syla Devoss, DO  11/28/2022, 8:15 AM

## 2022-11-28 NOTE — Progress Notes (Signed)
SATURATION QUALIFICATIONS:   Patient Saturations on Room Air at Rest = 91%  Patient Saturations on Room Air while Ambulating = 82%  Patient Saturations on 2 Liters of oxygen while Ambulating = 90%

## 2022-11-28 NOTE — Progress Notes (Addendum)
Contacted by nephrologist regarding pt's possible d/c today and need for out-pt HD later today or tomorrow. Contacted [facility] and spoke to [place]. Pt has a 8:00 chair time for tomorrow. Clinic cannot treat today. Spoke to pt via phone. Pt called the clinic this morning to arrange appt for tomorrow. Pt aware and agreeable to HD appt tomorrow. Update provided to attending and nephrologist.   Melven Sartorius Renal Navigator [phone]  Addendum at 11:44 pm: Contacted [facility] to advise clinic pt will d/c today and be at treatment tomorrow. Today's renal note faxed to clinic for continuation of care. Will fax d/c summary once completed.

## 2022-11-28 NOTE — Progress Notes (Signed)
Patient ID: Patricia [name], female   DOB: 1971/10/12, 51 y.o.   MRN: [id number] S:  LHC / RHC yesterday after arrival from [facility]: normal coronary anatomy, LVEDP 18, wedge 22, moderate PA HTN HD yesterday rec 2h of Tx and 2L UF before discontinuation for transport BPs better.  Last week at [facility] minoxidil was started, also on labetalol   O:BP (!) 143/65 (BP Location: Right Arm)   Pulse 78   Temp 98 F (36.7 C) (Oral)   Resp 18   Ht 5\' 3"  (1.6 m)   Wt 109 kg   SpO2 97%   BMI 42.57 kg/m   Intake/Output Summary (Last 24 hours) at 11/28/2022 1009 Last data filed at 11/27/2022 1400 Gross per 24 hour  Intake --  Output 2000 ml  Net -2000 ml    Intake/Output: I/O last 3 completed shifts: In: 240 [P.O.:240] Out: 2003 [Urine:1; Other:2000; Stool:2]  Intake/Output this shift:  No intake/output data recorded. Weight change: 0.369 kg Gen: NAD CVS: RRR Resp:CTA Abd: +BS, soft, NT/ND Ext: no edema, LUE AVF +T/B  Recent Labs  Lab 11/26/22 1104 11/27/22 0348 11/27/22 1641 11/27/22 1645  NA 138 142 138  138 137  K 3.1* 3.9 3.9  3.8 3.9  CL 103 107  --   --   CO2 23 22  --   --   GLUCOSE 219* 122*  --   --   BUN 34* 43*  --   --   CREATININE 7.24* 9.93*  --   --   CALCIUM 8.2* 9.2  --   --     Liver Function Tests: No results for input(s): "AST", "ALT", "ALKPHOS", "BILITOT", "PROT", "ALBUMIN" in the last 168 hours.  No results for input(s): "LIPASE", "AMYLASE" in the last 168 hours. No results for input(s): "AMMONIA" in the last 168 hours. CBC: Recent Labs  Lab 11/26/22 1104 11/27/22 0348 11/27/22 1641 11/27/22 1645  WBC 14.5* 14.6*  --   --   HGB 8.5* 8.5* 9.9*  9.9* 9.5*  HCT 27.2* 28.0* 29.0*  29.0* 28.0*  MCV 92.5 93.6  --   --   PLT 200 206  --   --     Cardiac Enzymes: No results for input(s): "CKTOTAL", "CKMB", "CKMBINDEX", "TROPONINI" in the last 168 hours. CBG: Recent Labs  Lab 11/26/22 1655 11/26/22 2040 11/27/22 0735 11/27/22 2114 11/28/22 0830   GLUCAP 123* 206* 133* 111* 235*     Iron Studies: No results for input(s): "IRON", "TIBC", "TRANSFERRIN", "FERRITIN" in the last 72 hours. Studies/Results: CARDIAC CATHETERIZATION  Result Date: 96/29/5284   LV end diastolic pressure is mildly elevated.   Hemodynamic findings consistent with moderate pulmonary hypertension. Normal coronary anatomy Mildly elevated LV filling pressures. LVEDP 18 mm Hg. PCWP 30/28 mean of 22 mm Hg Moderate pulmonary HTN PAP 76/21 mean 43 mm Hg HIgh cardiac output by Fick due to AV shunt. Plan: medical management.   DG Chest Port 1 View  Result Date: 11/26/2022 CLINICAL DATA:  Chest pain EXAM: PORTABLE CHEST 1 VIEW COMPARISON:  11/18/2022 FINDINGS: Cardiomegaly again noted with vascular congestion. External artifact overlies the chest. Postoperative clips project over the left chest and axilla. No definite superimposed pneumonia, CHF, large effusion or pneumothorax. Trachea midline. IMPRESSION: Cardiomegaly with vascular congestion. Electronically Signed   By: Jerilynn Mages.  Shick M.D.   On: 11/26/2022 10:32    aspirin EC  81 mg Oral Daily   azithromycin  250 mg Oral Q M,W,F   cholecalciferol  5,000 Units Oral  Daily   cinacalcet  30 mg Oral Q breakfast   ferric citrate  420 mg Oral BID WC   heparin  5,000 Units Subcutaneous Q8H   insulin aspart  0-5 Units Subcutaneous QHS   insulin aspart  0-9 Units Subcutaneous TID WC   labetalol  150 mg Oral BID   linaclotide  290 mcg Oral QAC breakfast   minoxidil  2.5 mg Oral BID   mometasone-formoterol  2 puff Inhalation BID   montelukast  10 mg Oral QHS   multivitamin  1 tablet Oral Daily   nitroGLYCERIN  0.2 mg Transdermal Daily   pantoprazole  40 mg Oral BID   predniSONE  10 mg Oral Q breakfast   rOPINIRole  1 mg Oral BID   rosuvastatin  10 mg Oral Daily   senna-docusate  2 tablet Oral QHS   sevelamer carbonate  1,600 mg Oral BID WC   sevelamer carbonate  2,400 mg Oral Q lunch   sodium chloride flush  3 mL Intravenous  Q12H   sodium chloride flush  3 mL Intravenous Q12H   sodium chloride flush  3 mL Intravenous Q12H    BMET    Component Value Date/Time   NA 137 11/27/2022 1645   K 3.9 11/27/2022 1645   CL 107 11/27/2022 0348   CO2 22 11/27/2022 0348   GLUCOSE 122 (H) 11/27/2022 0348   BUN 43 (H) 11/27/2022 0348   CREATININE 9.93 (H) 11/27/2022 0348   CREATININE 5.93 (H) 06/29/2019 1210   CALCIUM 9.2 11/27/2022 0348   GFRNONAA 4 (L) 11/27/2022 0348   GFRNONAA 8 (L) 06/29/2019 1210   GFRAA 4 (L) 08/04/2020 0452   GFRAA 9 (L) 06/29/2019 1210   CBC    Component Value Date/Time   WBC 14.6 (H) 11/27/2022 0348   RBC 2.99 (L) 11/27/2022 0348   HGB 9.5 (L) 11/27/2022 1645   HCT 28.0 (L) 11/27/2022 1645   PLT 206 11/27/2022 0348   MCV 93.6 11/27/2022 0348   MCH 28.4 11/27/2022 0348   MCHC 30.4 11/27/2022 0348   RDW 15.9 (H) 11/27/2022 0348   LYMPHSABS 0.6 (L) 11/21/2022 0427   MONOABS 0.4 11/21/2022 0427   EOSABS 0.0 11/21/2022 0427   BASOSABS 0.0 11/21/2022 0427    OP HD:  [facility] MWF From 2022  >> 4h 42min 400/600  Hep 1000+ 1800/hr  2/2.5 bath L AVF , EDW 110kg  Assessment/Plan:  Chest pain - LHC and RHC 12/12 normal coronaries, hig filling pressures and mod pulm HTN ESRD - normally MWF at [facility].  Completed only 3 hours of HD with evidence of some volume overload.  HD 2h on 11/27/22.  Has 0800 chair time outpatient tomorrow.  On RA, BPs ok, so can work on vol status as outpatient.  HTN/Volume - as above; needs vol on down as able.  Not sure if needs minoxidil [place] term, nothing wrong with using it but will need to be tirated by outpt nephrology.   Anemia of ESRD - cont with ESA.  Transfuse prn. ILD - known bronchiolitis obliterans.  On O2 via [place]  OK For discharge, outpt nephrology arranged.   Rexene Agent, MD  [facility]

## 2022-11-28 NOTE — Discharge Summary (Signed)
Physician Discharge Summary   Patient: Patricia [name] MRN: [id number] DOB: July 28, 1971  Admit date:     11/26/2022  Discharge date: 11/28/22  Discharge Physician: Estill Cotta, MD    PCP: Jake Samples, PA-C   Recommendations at discharge:   Cardiac cath showed normal coronaries, patient recommended to follow-up with her pulmonologist.  Appointment scheduled on 12/27/2022.  Discharge Diagnoses:    Precordial chest pain Chronic respiratory failure with hypoxia   Type 2 diabetes mellitus (HCC)   Elevated troponin   OSA (obstructive sleep apnea)   Obesity, Class III, BMI 40-49.9 (morbid obesity) (HCC)   Bronchiolitis obliterans   ESRD needing dialysis [facility])   Hospital Course:  Patient is a 51 y.o. 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 Domenic Polite, cardiology at [facility] recommended cardiac catheterization and transfer to [place] and Plan:   Chest pain, coronary calcification by CT, elevated troponins -On admission, troponin peaked at 186, EKG showed slight ST depression along the inferior leads.  Patient had 2D echo recently done which had shown preserved EF with no regional [facility] -Cardiology was consulted, patient was seen by Dr. Sherryle Lis at [facility].  Given her recurrent admissions and intermittent  episodes of chest discomfort, recommended further workup.  Patient was then transferred to [facility] for cardiac cath. -Patient underwent cardiac cath which showed normal coronary anatomy, mildly elevated LV filling pressures, moderate pulmonary hypertension, recommended medical management -Patient with continue to follow-up with advanced heart failure clinic for pulmonary hypertension -She also has follow-up appointment scheduled with pulmonology on 6/81/2751  Chronic diastolic CHF -Management with hemodialysis for volume status   ESRD on hemodialysis MWF -Nephrology following, patient will go to outpatient dialysis center for HD in AM.  Chronic respiratory failure with hypoxia -In the setting of obliterative bronchiolitis/ILD -Continue O2, has 2 L O2 at home, has follow-up with pulmonology, Dr. Halford Chessman -Continue management as recommended by Dr. Halford Chessman with Zithromax and steroid taper  Chronic anemia of ESRD -H&H currently stable, at baseline  Morbid obesity Body mass index is 42.57 kg/m.      Pain control - [employer] Controlled Substance Reporting System database was reviewed. and patient was instructed, not to drive, operate heavy machinery, perform activities at heights, swimming or participation in water activities or provide baby-sitting services while on Pain, Sleep and Anxiety Medications; until their outpatient Physician has advised to do so again. Also recommended to not to take more than prescribed Pain, Sleep and Anxiety Medications.  Consultants: Cardiology, nephrology Procedures performed: Cardiac catheterization Disposition: Home Diet recommendation: Renal diet  DISCHARGE MEDICATION: Allergies as of 11/28/2022       Reactions   Amlodipine Besylate Rash, Other (See Comments)   dizziness   Reglan [metoclopramide] Other (See Comments)   hallucinations        Medication List  TAKE these medications    acetaminophen 325 MG tablet Commonly known as:  TYLENOL Take 650 mg by mouth every 6 (six) hours as needed.   albuterol (2.5 MG/3ML) 0.083% nebulizer solution Commonly known as: PROVENTIL Take 3 mLs (2.5 mg total) by nebulization every 6 (six) hours as needed for wheezing or shortness of breath.   albuterol 108 (90 Base) MCG/ACT inhaler Commonly known as: Ventolin HFA Inhale 2 puffs into the lungs every 6 (six) hours as needed for wheezing or shortness of breath.   aspirin EC 81 MG tablet Take 1 tablet (81 mg total) by mouth daily. Swallow whole.   azithromycin 250 MG tablet Commonly known as: ZITHROMAX 1 tab every Monday, Wednesday, and Friday   cetirizine 10 MG tablet Commonly known as: ZYRTEC Take 10 mg by mouth daily.   cinacalcet 60 MG tablet Commonly known as: SENSIPAR Take 30 mg by mouth daily with breakfast.   dicyclomine 10 MG capsule Commonly known as: BENTYL Take 1 capsule (10 mg total) by mouth 2 (two) times daily as needed. What changed: reasons to take this   docusate sodium 100 MG capsule Commonly known as: COLACE Take 100 mg by mouth daily.   ferric citrate 1 GM 210 MG(Fe) tablet Commonly known as: AURYXIA Take 2 tablets by mouth 2 (two) times daily with a meal. With Breakfast & with supper   glipiZIDE 10 MG tablet Commonly known as: GLUCOTROL Take 15 mg by mouth 2 (two) times daily.   ipratropium 0.03 % nasal spray Commonly known as: ATROVENT Place 2 sprays into both nostrils 2 (two) times daily.   labetalol 100 MG tablet Commonly known as: NORMODYNE Take 1.5 tablets (150 mg total) by mouth 2 (two) times daily.   linaclotide 290 MCG Caps capsule Commonly known as: LINZESS Take 290 mcg by mouth daily before breakfast.   minoxidil 2.5 MG tablet Commonly known as: LONITEN Take 1 tablet (2.5 mg total) by mouth 2 (two) times daily.   montelukast 10 MG tablet Commonly known as: SINGULAIR TAKE 1 TABLET BY MOUTH EVERYDAY AT BEDTIME   multivitamin Tabs tablet Take 1 tablet by mouth daily.    nitroGLYCERIN 0.4 MG SL tablet Commonly known as: Nitrostat Place 1 tablet (0.4 mg total) under the tongue every 5 (five) minutes as needed for chest pain.   ondansetron 4 MG disintegrating tablet Commonly known as: ZOFRAN-ODT Take 1 tablet (4 mg total) by mouth as needed for nausea or vomiting.   pantoprazole 40 MG tablet Commonly known as: PROTONIX Take 1 tablet (40 mg total) by mouth 2 (two) times daily. 30 minutes before breakfast   predniSONE 10 MG tablet Commonly known as: DELTASONE Take 3 tablets (30 mg total) by mouth daily with breakfast for 7 days, THEN 2 tablets (20 mg total) daily with breakfast for 7 days, THEN 1 tablet (10 mg total) daily with breakfast for 28 days. Start taking on: November 23, 2022   rOPINIRole 2 MG 24 hr tablet Commonly known as: REQUIP XL Take 2 mg by mouth at bedtime.   rosuvastatin 10 MG tablet Commonly known as: CRESTOR Take 1 tablet (10 mg total) by mouth daily.   sevelamer carbonate 800 MG tablet Commonly known as: RENVELA Take 800 mg by mouth See admin instructions. Take 2 tablets (1600 mg) in the morning, take 3 tablets (2400 mg) with lunch or snack,  and take 2 tablets (1600 mg) by mouth in the evening with dinner meal.   sodium chloride 0.65 % Soln nasal  spray Commonly known as: OCEAN Place 1 spray into both nostrils as needed for congestion.   Symbicort 160-4.5 MCG/ACT inhaler Generic drug: budesonide-formoterol Inhale 2 puffs into the lungs daily.   Vitamin D3 125 MCG (5000 UT) Caps TAKE 1 CAPSULE BY MOUTH EVERY DAY               Durable Medical Equipment  (From admission, onward)           Start     Ordered   11/28/22 1102  For home use only DME oxygen  Once       Question Answer Comment  Length of Need 6 Months   Mode or (Route) Nasal cannula   Liters per Minute 2   Frequency Continuous (stationary and portable oxygen unit needed)   Oxygen conserving device Yes   Oxygen delivery system Gas      11/28/22  1101            Follow-up Information     Jake Samples, PA-C. Schedule an appointment as soon as possible for a visit in 2 week(s).   Specialty: Family Medicine Why: for hospital follow-up Contact information: [address] [place] [address] [phone]         Chesley Mires, MD Follow up on 12/27/2022.   Specialty: Pulmonary Disease Why: at 8:45AM Contact information: [address] [place] [address] [phone]                Discharge Exam: Filed Weights   11/27/22 1400 11/27/22 2113 11/28/22 4765  Weight: 109.3 kg 108.8 kg 109 kg   S: No acute complaints, has chronic shortness of breath, no acute chest pain, nausea vomiting or fevers.  BP (!) 149/60 (BP Location: Right Arm)   Pulse 75   Temp 97.9 F (36.6 C) (Oral)   Resp 18   Ht 5\' 3"  (1.6 m)   Wt 109 kg   SpO2 99%   BMI 42.57 kg/m    Physical Exam General: Alert and oriented x 3, NAD Cardiovascular: S1 S2 clear, RRR.  Respiratory: CTAB, no wheezing, rales or rhonchi Gastrointestinal: Soft, nontender, nondistended, NBS Ext: no pedal edema bilaterally Neuro: no new deficits Psych: Normal affect and demeanor, alert and oriented x3    Condition at discharge: fair  The results of significant diagnostics from this hospitalization (including imaging, microbiology, ancillary and laboratory) are listed below for reference.   Imaging Studies: CARDIAC CATHETERIZATION  Result Date: 46/50/3546   LV end diastolic pressure is mildly elevated.   Hemodynamic findings consistent with moderate pulmonary hypertension. Normal coronary anatomy Mildly elevated LV filling pressures. LVEDP 18 mm Hg. PCWP 30/28 mean of 22 mm Hg Moderate pulmonary HTN PAP 76/21 mean 43 mm Hg HIgh cardiac output by Fick due to AV shunt. Plan: medical management.   DG Chest Port 1 View  Result Date: 11/26/2022 CLINICAL DATA:  Chest pain EXAM: PORTABLE CHEST 1 VIEW COMPARISON:  11/18/2022 FINDINGS:  Cardiomegaly again noted with vascular congestion. External artifact overlies the chest. Postoperative clips project over the left chest and axilla. No definite superimposed pneumonia, CHF, large effusion or pneumothorax. Trachea midline. IMPRESSION: Cardiomegaly with vascular congestion. Electronically Signed   By: Jerilynn Mages.  Shick M.D.   On: 11/26/2022 10:32   NM Pulmonary Perfusion  Result Date: 11/19/2022 CLINICAL DATA:  Chest pain for 2 days, nonspecific. Shortness of breath for 2 weeks EXAM: NUCLEAR MEDICINE PERFUSION LUNG SCAN TECHNIQUE: Perfusion images were obtained in multiple projections after intravenous injection of  radiopharmaceutical. Ventilation scans intentionally deferred if perfusion scan and chest x-ray adequate for interpretation during COVID 19 epidemic. RADIOPHARMACEUTICALS:  4.17 mCi Tc-26m MAA IV COMPARISON:  Chest radiograph 11/18/2022 FINDINGS: There is a uniform distribution of the radiopharmaceutical within both lungs. No peripheral segmental perfusion defects identified bilaterally to suggest an acute pulmonary embolus. IMPRESSION: No evidence for acute pulmonary embolus. Electronically Signed   By: Kerby Moors M.D.   On: 11/19/2022 13:39   ECHOCARDIOGRAM COMPLETE  Result Date: 11/19/2022    ECHOCARDIOGRAM REPORT   Patient Name:   [facility] Date of Exam: 11/19/2022 Medical Rec #:  [id number]   Height:       63.0 in Accession #:    [id number]  Weight:       259.3 lb Date of Birth:  10/06/1971  BSA:          2.159 m Patient Age:    48 years    BP:           169/46 mmHg Patient Gender: F           HR:           78 bpm. Exam Location:  [facility] Procedure: 2D Echo, Cardiac Doppler and Color Doppler Indications:    Chest Pain  History:        Patient has prior history of Echocardiogram examinations, most                 recent 06/27/2021. CHF, Signs/Symptoms:Chest Pain, Shortness of                 Breath and Dyspnea; Risk Factors:Hypertension and Diabetes.                 Breast CA.   Sonographer:    Wenda Low Referring Phys: [phone] DAVID TAT  Sonographer Comments: Patient is obese. IMPRESSIONS  1. Left ventricular ejection fraction, by estimation, is 70 to 75%. The left ventricle has hyperdynamic function. The left ventricle has no regional wall motion abnormalities. There is mild left ventricular hypertrophy. Left ventricular diastolic parameters are consistent with Grade I diastolic dysfunction (impaired relaxation). Elevated left atrial pressure.  2. Right ventricular systolic function is normal. The right ventricular size is normal. Tricuspid regurgitation signal is inadequate for assessing PA pressure.  3. The mitral valve is normal in structure. No evidence of mitral valve regurgitation. No evidence of mitral stenosis.  4. The tricuspid valve is abnormal.  5. The aortic valve is tricuspid. Aortic valve regurgitation is not visualized. Mild aortic valve stenosis. Aortic valve mean gradient measures 12.0 mmHg. Aortic valve peak gradient measures 27.1 mmHg. Aortic valve area, by VTI measures 2.38 cm.  6. The inferior vena cava is dilated in size with >50% respiratory variability, suggesting right atrial pressure of 8 mmHg. FINDINGS  Left Ventricle: Left ventricular ejection fraction, by estimation, is 70 to 75%. The left ventricle has hyperdynamic function. The left ventricle has no regional wall motion abnormalities. The left ventricular internal cavity size was normal in size. There is mild left ventricular hypertrophy. Left ventricular diastolic parameters are consistent with Grade I diastolic dysfunction (impaired relaxation). Elevated left atrial pressure. Right Ventricle: The right ventricular size is normal. Right vetricular wall thickness was not well visualized. Right ventricular systolic function is normal. Tricuspid regurgitation signal is inadequate for assessing PA pressure. Left Atrium: Left atrial size was normal in size. Right Atrium: Right atrial size was normal in  size. Pericardium: There is no evidence of pericardial effusion.  Mitral Valve: The mitral valve is normal in structure. There is mild thickening of the mitral valve leaflet(s). There is mild calcification of the mitral valve leaflet(s). Mild mitral annular calcification. No evidence of mitral valve regurgitation. No evidence of mitral valve stenosis. MV peak gradient, 9.5 mmHg. The mean mitral valve gradient is 4.0 mmHg. Tricuspid Valve: The tricuspid valve is abnormal. Tricuspid valve regurgitation is mild . No evidence of tricuspid stenosis. Aortic Valve: The aortic valve is tricuspid. Aortic valve regurgitation is not visualized. Mild aortic stenosis is present. Aortic valve mean gradient measures 12.0 mmHg. Aortic valve peak gradient measures 27.1 mmHg. Aortic valve area, by VTI measures 2.38 cm. Pulmonic Valve: The pulmonic valve was not well visualized. Pulmonic valve regurgitation is not visualized. No evidence of pulmonic stenosis. Aorta: The aortic root is normal in size and structure. Venous: The inferior vena cava is dilated in size with greater than 50% respiratory variability, suggesting right atrial pressure of 8 mmHg. IAS/Shunts: No atrial level shunt detected by color flow Doppler.  LEFT VENTRICLE PLAX 2D LVIDd:         4.40 cm   Diastology LVIDs:         2.50 cm   LV e' medial:    6.96 cm/s LV PW:         1.10 cm   LV E/e' medial:  16.5 LV IVS:        1.10 cm   LV e' lateral:   6.74 cm/s LVOT diam:     2.00 cm   LV E/e' lateral: 17.1 LV SV:         116 LV SV Index:   54 LVOT Area:     3.14 cm  RIGHT VENTRICLE RV Basal diam:  4.05 cm RV Mid diam:    3.30 cm RV S prime:     16.80 cm/s TAPSE (M-mode): 3.1 cm LEFT ATRIUM              Index        RIGHT ATRIUM           Index LA diam:        4.70 cm  2.18 cm/m   RA Area:     23.50 cm LA Vol (A2C):   103.0 ml 47.70 ml/m  RA Volume:   72.80 ml  33.72 ml/m LA Vol (A4C):   71.4 ml  33.07 ml/m LA Biplane Vol: 87.1 ml  40.34 ml/m  AORTIC VALVE AV Area  (Vmax):    1.95 cm AV Area (Vmean):   1.88 cm AV Area (VTI):     2.38 cm AV Vmax:           260.50 cm/s AV Vmean:          158.500 cm/s AV VTI:            0.489 m AV Peak Grad:      27.1 mmHg AV Mean Grad:      12.0 mmHg LVOT Vmax:         162.00 cm/s LVOT Vmean:        95.100 cm/s LVOT VTI:          0.370 m LVOT/AV VTI ratio: 0.76  AORTA Ao Root diam: 3.00 cm MITRAL VALVE MV Area (PHT): 2.81 cm     SHUNTS MV Area VTI:   2.79 cm     Systemic VTI:  0.37 m MV Peak grad:  9.5 mmHg     Systemic Diam: 2.00 cm  MV Mean grad:  4.0 mmHg MV Vmax:       1.54 m/s MV Vmean:      97.9 cm/s MV Decel Time: 270 msec MV E velocity: 115.00 cm/s MV A velocity: 142.00 cm/s MV E/A ratio:  0.81 Carlyle Dolly MD Electronically signed by Carlyle Dolly MD Signature Date/Time: 11/19/2022/12:03:01 PM    Final    US Abdomen Limited RUQ (LIVER/GB)  Result Date: 11/19/2022 CLINICAL DATA:  Elevated LFTs EXAM: ULTRASOUND ABDOMEN LIMITED RIGHT UPPER QUADRANT COMPARISON:  No prior abdomen ultrasound available, correlation is made with CT abdomen pelvis 07/01/2021 FINDINGS: Gallbladder: Status post cholecystectomy. Common bile duct: Diameter: 5 mm, within normal limits. No intrahepatic biliary ductal dilatation. Liver: No focal lesion identified. Increased parenchymal echogenicity. Portal vein is patent on color Doppler imaging with normal direction of blood flow towards the liver, although the portal vein and hepatic vein appears somewhat prominent. Other: None. IMPRESSION: 1. Hepatic steatosis. 2. Portal vein and hepatic vein appear somewhat prominent, although there is normal direction of blood flow toward the liver in the portal vein. Electronically Signed   By: Merilyn Baba M.D.   On: 11/19/2022 11:14   DG Chest 2 View  Result Date: 11/18/2022 CLINICAL DATA:  Shortness of breath EXAM: CHEST - 2 VIEW COMPARISON:  10/29/2022 FINDINGS: Moderate cardiomegaly with pulmonary vascular congestion. Diffuse bilateral interstitial and  alveolar opacities. No large pleural fluid collection. No pneumothorax. IMPRESSION: Appearance suggests CHF with pulmonary edema. Electronically Signed   By: Davina Poke D.O.   On: 11/18/2022 13:48    Microbiology: Results for orders placed or performed during the hospital encounter of 11/26/22  Culture, blood (Routine X 2) w Reflex to ID Panel     Status: None (Preliminary result)   Collection Time: 11/27/22 12:53 PM   Specimen: A-Line; Blood  Result Value Ref Range Status   Specimen Description A-LINE  Final   Special Requests   Final    BOTTLES DRAWN AEROBIC AND ANAEROBIC Blood Culture adequate volume   Culture   Final    NO GROWTH < 24 HOURS Performed at [facility], [address], [place], [place] [address]    Report Status PENDING  Incomplete  Culture, blood (Routine X 2) w Reflex to ID Panel     Status: None (Preliminary result)   Collection Time: 11/27/22 12:53 PM   Specimen: Site Not Specified; Blood  Result Value Ref Range Status   Specimen Description SITE NOT SPECIFIED  Final   Special Requests   Final    BOTTLES DRAWN AEROBIC AND ANAEROBIC Blood Culture adequate volume   Culture   Final    NO GROWTH < 24 HOURS Performed at [facility], [address], [place], [place] [address]    Report Status PENDING  Incomplete    Labs: CBC: Recent Labs  Lab 11/26/22 1104 11/27/22 0348 11/27/22 1641 11/27/22 1645  WBC 14.5* 14.6*  --   --   HGB 8.5* 8.5* 9.9*  9.9* 9.5*  HCT 27.2* 28.0* 29.0*  29.0* 28.0*  MCV 92.5 93.6  --   --   PLT 200 206  --   --    Basic Metabolic Panel: Recent Labs  Lab 11/26/22 1104 11/27/22 0348 11/27/22 1641 11/27/22 1645  NA 138 142 138  138 137  K 3.1* 3.9 3.9  3.8 3.9  CL 103 107  --   --   CO2 23 22  --   --   GLUCOSE 219* 122*  --   --  BUN 34* 43*  --   --   CREATININE 7.24* 9.93*  --   --   CALCIUM 8.2* 9.2  --   --    Liver Function Tests: No results for input(s): "AST", "ALT", "ALKPHOS", "BILITOT", "PROT",  "ALBUMIN" in the last 168 hours. CBG: Recent Labs  Lab 11/27/22 0735 11/27/22 2114 11/28/22 0830 11/28/22 1029 11/28/22 1157  GLUCAP 133* 111* 235* 209* 228*    Discharge time spent: greater than 30 minutes.  Signed: Estill Cotta, MD [facility] 11/28/2022

## 2022-11-28 NOTE — Progress Notes (Signed)
Heart Failure Navigator Progress Note  Assessed for Heart & Vascular TOC clinic readiness.  Patient does not meet criteria due to ESRD on HD.   HF Navigation team will sign-off.   Pricilla Holm, MSN, RN Heart Failure Nurse Navigator

## 2022-11-29 DIAGNOSIS — N186 End stage renal disease: Secondary | ICD-10-CM | POA: Diagnosis not present

## 2022-11-29 DIAGNOSIS — N250 Renal osteodystrophy: Secondary | ICD-10-CM | POA: Diagnosis not present

## 2022-11-29 DIAGNOSIS — Z992 Dependence on renal dialysis: Secondary | ICD-10-CM | POA: Diagnosis not present

## 2022-11-29 DIAGNOSIS — D509 Iron deficiency anemia, unspecified: Secondary | ICD-10-CM | POA: Diagnosis not present

## 2022-11-29 DIAGNOSIS — D631 Anemia in chronic kidney disease: Secondary | ICD-10-CM | POA: Diagnosis not present

## 2022-11-29 DIAGNOSIS — N2581 Secondary hyperparathyroidism of renal origin: Secondary | ICD-10-CM | POA: Diagnosis not present

## 2022-11-29 LAB — LIPOPROTEIN A (LPA): Lipoprotein (a): 109.9 nmol/L — ABNORMAL HIGH (ref <75.0)

## 2022-11-29 NOTE — Patient Outreach (Signed)
  Care Coordination   Initial Visit Note   11/29/2022 Name: Patricia [name] MRN: [id number] DOB: [birth year]  Patricia [name] is a 51 y.o. year old female who sees Jake Samples, [place] for primary care. I spoke with  Jolyssa Gambrel by phone today.  What matters to the patients health and wellness today?  Post discharge f/u care coordination    Goals Addressed             This Visit's Progress    Care Coordination Services       Care Coordination Interventions: Evaluation of current treatment plan related to Discharge from [facility] on 11/28/22 for precordial chest pain and patient's adherence to plan as established by provider Provided education to patient re: discharge instructions. Continue home O2, dialysis 3 days per week, f/u with Dr Dorris Fetch, PCP, and Dr Halford Chessman Reviewed medications with patient and discussed access and affordability. Prescribed prednisone and zithromax at discharge for chronic respiratory failure with hypoxia Collaborated with [facility] and Dr Liliane Channel office regarding need for hospital follow-up appt on a Tues or Thurs (due to dialysis on M,W,F)  Reviewed scheduled/upcoming provider appointments including Dr Halford Chessman (pulmonary) 12/27/22 at 8:45. Verified transportation.  Discussed plans with patient for ongoing care management follow up and provided patient with direct contact information for care management team Assessed social determinant of health barriers Provided with [facility] direct contact number and encouraged to reach out as needed advised to call pulmonologist or PCP or seek emergency medical attention if needed for any new or worsening symptoms         SDOH assessments and interventions completed:  Yes  SDOH Interventions Today    Flowsheet Row Most Recent Value  SDOH Interventions   Housing Interventions Intervention Not Indicated  Transportation Interventions Intervention Not Indicated        Care Coordination Interventions:  Yes, provided   Follow  up plan: Follow up call scheduled for 12/18/2022    Encounter Outcome:  Pt. Visit Completed   Chong Sicilian, BSN, RN-BC RN Care Coordinator [place]: [phone] Main #: [phone]

## 2022-11-29 NOTE — Progress Notes (Signed)
Late Entry Note  D/C summary faxed to [place] this morning for continuation of care.   Melven Sartorius Renal Navigator [phone]

## 2022-11-30 DIAGNOSIS — D509 Iron deficiency anemia, unspecified: Secondary | ICD-10-CM | POA: Diagnosis not present

## 2022-11-30 DIAGNOSIS — N186 End stage renal disease: Secondary | ICD-10-CM | POA: Diagnosis not present

## 2022-11-30 DIAGNOSIS — D631 Anemia in chronic kidney disease: Secondary | ICD-10-CM | POA: Diagnosis not present

## 2022-11-30 DIAGNOSIS — Z992 Dependence on renal dialysis: Secondary | ICD-10-CM | POA: Diagnosis not present

## 2022-11-30 DIAGNOSIS — N250 Renal osteodystrophy: Secondary | ICD-10-CM | POA: Diagnosis not present

## 2022-11-30 DIAGNOSIS — N2581 Secondary hyperparathyroidism of renal origin: Secondary | ICD-10-CM | POA: Diagnosis not present

## 2022-12-02 LAB — CULTURE, BLOOD (ROUTINE X 2)
Culture: NO GROWTH
Culture: NO GROWTH
Special Requests: ADEQUATE
Special Requests: ADEQUATE

## 2022-12-03 DIAGNOSIS — N186 End stage renal disease: Secondary | ICD-10-CM | POA: Diagnosis not present

## 2022-12-03 DIAGNOSIS — D509 Iron deficiency anemia, unspecified: Secondary | ICD-10-CM | POA: Diagnosis not present

## 2022-12-03 DIAGNOSIS — Z992 Dependence on renal dialysis: Secondary | ICD-10-CM | POA: Diagnosis not present

## 2022-12-03 DIAGNOSIS — N250 Renal osteodystrophy: Secondary | ICD-10-CM | POA: Diagnosis not present

## 2022-12-03 DIAGNOSIS — N2581 Secondary hyperparathyroidism of renal origin: Secondary | ICD-10-CM | POA: Diagnosis not present

## 2022-12-03 DIAGNOSIS — D631 Anemia in chronic kidney disease: Secondary | ICD-10-CM | POA: Diagnosis not present

## 2022-12-05 DIAGNOSIS — D509 Iron deficiency anemia, unspecified: Secondary | ICD-10-CM | POA: Diagnosis not present

## 2022-12-05 DIAGNOSIS — N250 Renal osteodystrophy: Secondary | ICD-10-CM | POA: Diagnosis not present

## 2022-12-05 DIAGNOSIS — Z992 Dependence on renal dialysis: Secondary | ICD-10-CM | POA: Diagnosis not present

## 2022-12-05 DIAGNOSIS — D631 Anemia in chronic kidney disease: Secondary | ICD-10-CM | POA: Diagnosis not present

## 2022-12-05 DIAGNOSIS — N186 End stage renal disease: Secondary | ICD-10-CM | POA: Diagnosis not present

## 2022-12-05 DIAGNOSIS — N2581 Secondary hyperparathyroidism of renal origin: Secondary | ICD-10-CM | POA: Diagnosis not present

## 2022-12-06 DIAGNOSIS — Z992 Dependence on renal dialysis: Secondary | ICD-10-CM | POA: Diagnosis not present

## 2022-12-06 DIAGNOSIS — N186 End stage renal disease: Secondary | ICD-10-CM | POA: Diagnosis not present

## 2022-12-06 DIAGNOSIS — E8770 Fluid overload, unspecified: Secondary | ICD-10-CM | POA: Diagnosis not present

## 2022-12-07 ENCOUNTER — Telehealth: Payer: Self-pay | Admitting: *Deleted

## 2022-12-07 MED ORDER — DOXYCYCLINE HYCLATE 100 MG PO TABS: 100.0000 mg | ORAL_TABLET | Freq: Two times a day (BID) | ORAL | 0 refills | Status: DC

## 2022-12-07 NOTE — Telephone Encounter (Signed)
Called and left voicemail for patient to call office back in regards to medication

## 2022-12-07 NOTE — Telephone Encounter (Signed)
Primary Pulmonologist: Dr. Halford Chessman  Last office visit and with whom: 11/01/2022 Dr. Halford Chessman  What do we see them for (pulmonary problems): ILD/ Chronic resp failure with hypoxia/ Obliterative bronchiolitis/ Interstitial Pulmonary Disease  Last OV assessment/plan: see below   Was appointment offered to patient (explain)?  Pt has an appt with Dr. Halford Chessman on 12/27/2022 in [facility] office    Reason for call: Patient called and states that she has been in the hospital twice this month for SOB and chest pain. She states since she was discharged after last admission she has felt like she has phlegm stuck in her chest and throat that she cannot get up. She states that she is coughing so hard it is making her chest hurt and she is still not able to get phlegm up. She has been taking prednisone and has also been taking z pak every M-W-F as prescribed by Dr.Sood. Patient wants to know if we can prescribe an antibiotic/ medication for her to help with getting phlegm up so she is not feeling bad over the holidays.   RX: [facility]   Dr. Halford Chessman is out of office today so sending to [facility]. Please advise.   Allergies  Allergen Reactions   Amlodipine Besylate Rash and Other (See Comments)    dizziness   Reglan [Metoclopramide] Other (See Comments)    hallucinations     Immunization History  Administered Date(s) Administered   Influenza Split 09/20/2015   Influenza,trivalent, recombinat, inj, PF 09/20/2015   Influenza-Unspecified 09/30/2014, 08/25/2015, 09/06/2016, 08/19/2017, 10/04/2017, 10/15/2018, 09/25/2019, 10/03/2020, 08/31/2022   Moderna SARS-COV2 Booster Vaccination 11/07/2021   Moderna Sars-Covid-2 Vaccination 03/02/2020, 03/30/2020, 05/02/2020, 12/16/2020   PPD Test 08/10/2015, 09/06/2015, 02/10/2016, 09/09/2017, 10/20/2018, 11/23/2019, 11/30/2020, 11/08/2021, 12/06/2021   Pneumococcal Polysaccharide-23 01/03/2022   Pneumococcal-Unspecified 03/02/2016, 02/20/2017   Unspecified SARS-COV-2 Vaccination  03/02/2020, 03/30/2020    Assessment/Plan:    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 - intolerant of breztri - will give trial of azithromycin 250 mg TIW and prednisone taper down to 10 mg daily until her next appointment - continue symbicort, singulair - prn albuterol - she has a nebulizer machine   Chronic respiratory failure with hypoxia. - 2 liters oxygen with exertion and sleep - gets supplies from [facility]   Obstructive sleep apnea. - she is intolerant of CPAP   Obesity. - discussed importance of weight loss   ESRD on iHD. - followed by [facility]   Coronary artery disease, Chronic diastolic CHF. - followed by Dr. Dorris Carnes with cardiology   Thyroid nodule. - followed by Dr. Dorris Fetch with Endocrinology   Time Spent Involved in Patient Care on Day of Examination:  37 minutes   Follow up:    Patient Instructions  Zithromax 250 mg every Monday, Wednesday, and Friday   Prednisone 10 mg pill >> 3 pills daily for 7 days, then 2 pills daily for 7 days, then 1 pill daily until your follow up appointment   Follow up in 4 to 6 weeks   Medication List:    Allergies as of 11/01/2022         Reactions    Amlodipine Besylate Rash, Other (See Comments)    dizziness    Reglan [metoclopramide] Other (See Comments)    hallucinations            Medication List           Accurate as of November 01, 2022  9:00 AM. If you have any  questions, ask your nurse or doctor.              acetaminophen 325 MG tablet Commonly known as: TYLENOL Take 650 mg by mouth every 6 (six) hours as needed.    albuterol (2.5 MG/3ML) 0.083% nebulizer solution Commonly known as: PROVENTIL Take 3 mLs (2.5 mg total) by nebulization every 6 (six) hours as needed for wheezing or shortness of breath.    albuterol 108 (90 Base) MCG/ACT inhaler Commonly known as: Ventolin HFA Inhale 2 puffs into the lungs every 6 (six) hours as needed  for wheezing or shortness of breath.    aspirin EC 81 MG tablet Take 1 tablet (81 mg total) by mouth daily. Swallow whole.    azithromycin 250 MG tablet Commonly known as: ZITHROMAX 1 tab every Monday, Wednesday, and Friday Started by: Chesley Mires, MD    cetirizine 10 MG tablet Commonly known as: ZYRTEC Take 10 mg by mouth daily.    cinacalcet 60 MG tablet Commonly known as: SENSIPAR Take 30 mg by mouth daily with breakfast.    dicyclomine 10 MG capsule Commonly known as: BENTYL Take 1 capsule (10 mg total) by mouth 2 (two) times daily as needed.    ferric citrate 1 GM 210 MG(Fe) tablet Commonly known as: AURYXIA Take 2 tablets by mouth 2 (two) times daily with a meal. With Breakfast & with supper    glipiZIDE 10 MG tablet Commonly known as: GLUCOTROL Take 10 mg by mouth 2 (two) times daily.    ipratropium 0.03 % nasal spray Commonly known as: ATROVENT Place 2 sprays into both nostrils 2 (two) times daily.    labetalol 100 MG tablet Commonly known as: NORMODYNE Take 100 mg by mouth 2 (two) times daily.    linaclotide 290 MCG Caps capsule Commonly known as: LINZESS Take 290 mcg by mouth daily before breakfast.    montelukast 10 MG tablet Commonly known as: SINGULAIR TAKE 1 TABLET BY MOUTH EVERYDAY AT BEDTIME    multivitamin Tabs tablet Take 1 tablet by mouth daily.    NIFEdipine 60 MG 24 hr tablet Commonly known as: ADALAT CC Take 60 mg by mouth daily.    ondansetron 4 MG disintegrating tablet Commonly known as: ZOFRAN-ODT Take 4 mg by mouth as needed for nausea or vomiting.    pantoprazole 40 MG tablet Commonly known as: PROTONIX Take 1 tablet (40 mg total) by mouth daily. 30 minutes before breakfast    predniSONE 10 MG tablet Commonly known as: DELTASONE Take 3 tablets (30 mg total) by mouth daily with breakfast for 7 days, THEN 2 tablets (20 mg total) daily with breakfast for 7 days, THEN 1 tablet (10 mg total) daily with breakfast for 28 days. Start  taking on: November 01, 2022 Started by: Chesley Mires, MD    rOPINIRole 2 MG 24 hr tablet Commonly known as: REQUIP XL Take 2 mg by mouth at bedtime.    rosuvastatin 10 MG tablet Commonly known as: CRESTOR Take 1 tablet (10 mg total) by mouth daily.    sevelamer carbonate 800 MG tablet Commonly known as: RENVELA Take 800 mg by mouth See admin instructions. Take 2 tablets (1600 mg) in the morning, take 3 tablets (2400 mg) with lunch or snack,  and take 2 tablets (1600 mg) by mouth in the evening with dinner meal.    sodium chloride 0.65 % Soln nasal spray Commonly known as: OCEAN Place 1 spray into both nostrils as needed for congestion.    Symbicort  160-4.5 MCG/ACT inhaler Generic drug: budesonide-formoterol Inhale into the lungs.    VITAMIN D PO Take by mouth.    Vitamin D3 125 MCG (5000 UT) Caps TAKE 1 CAPSULE BY MOUTH EVERY DAY             Signature:  Chesley Mires, MD [facility] Pager - [phone] 11/01/2022, 9:00 AM

## 2022-12-07 NOTE — Telephone Encounter (Signed)
Called and spoke to patient and let her know that Dr Lamonte Sakai was wanting to send in Doxy 1 tablet 2 times a day for 7 days. I advised her that she needs to stop the antibiotic that she takes on M,W,F. She verbalized understanding. I even put a note to pharmacy that she needs to stop Zpack while on doxy so she does not forget. Verified pharmacy. Nothing further needed

## 2022-12-07 NOTE — Telephone Encounter (Signed)
Have her take doxycycline 100mg  bid x 7 days. Stop the azithro while she is on the doxy. Then restart it on usual schedule after the doxy is finished.

## 2022-12-08 DIAGNOSIS — D631 Anemia in chronic kidney disease: Secondary | ICD-10-CM | POA: Diagnosis not present

## 2022-12-08 DIAGNOSIS — Z992 Dependence on renal dialysis: Secondary | ICD-10-CM | POA: Diagnosis not present

## 2022-12-08 DIAGNOSIS — N186 End stage renal disease: Secondary | ICD-10-CM | POA: Diagnosis not present

## 2022-12-08 DIAGNOSIS — N250 Renal osteodystrophy: Secondary | ICD-10-CM | POA: Diagnosis not present

## 2022-12-08 DIAGNOSIS — N2581 Secondary hyperparathyroidism of renal origin: Secondary | ICD-10-CM | POA: Diagnosis not present

## 2022-12-08 DIAGNOSIS — D509 Iron deficiency anemia, unspecified: Secondary | ICD-10-CM | POA: Diagnosis not present

## 2022-12-10 ENCOUNTER — Other Ambulatory Visit: Payer: Self-pay

## 2022-12-10 ENCOUNTER — Encounter (HOSPITAL_COMMUNITY): Payer: Self-pay | Admitting: Internal Medicine

## 2022-12-10 ENCOUNTER — Emergency Department (HOSPITAL_COMMUNITY): Payer: Medicare Other

## 2022-12-10 ENCOUNTER — Inpatient Hospital Stay (HOSPITAL_COMMUNITY)
Admission: EM | Admit: 2022-12-10 | Discharge: 2022-12-14 | DRG: 291 | Disposition: A | Discharge status: Home / Self Care | Payer: Medicare Other | Attending: Internal Medicine | Admitting: Internal Medicine

## 2022-12-10 DIAGNOSIS — I120 Hypertensive chronic kidney disease with stage 5 chronic kidney disease or end stage renal disease: Secondary | ICD-10-CM | POA: Diagnosis not present

## 2022-12-10 DIAGNOSIS — J4481 Bronchiolitis obliterans and bronchiolitis obliterans syndrome: Secondary | ICD-10-CM | POA: Diagnosis present

## 2022-12-10 DIAGNOSIS — J810 Acute pulmonary edema: Principal | ICD-10-CM

## 2022-12-10 DIAGNOSIS — D631 Anemia in chronic kidney disease: Secondary | ICD-10-CM | POA: Diagnosis present

## 2022-12-10 DIAGNOSIS — Z8249 Family history of ischemic heart disease and other diseases of the circulatory system: Secondary | ICD-10-CM | POA: Diagnosis not present

## 2022-12-10 DIAGNOSIS — E1165 Type 2 diabetes mellitus with hyperglycemia: Secondary | ICD-10-CM | POA: Diagnosis present

## 2022-12-10 DIAGNOSIS — I132 Hypertensive heart and chronic kidney disease with heart failure and with stage 5 chronic kidney disease, or end stage renal disease: Principal | ICD-10-CM | POA: Diagnosis present

## 2022-12-10 DIAGNOSIS — R7989 Other specified abnormal findings of blood chemistry: Secondary | ICD-10-CM | POA: Diagnosis present

## 2022-12-10 DIAGNOSIS — G4733 Obstructive sleep apnea (adult) (pediatric): Secondary | ICD-10-CM | POA: Diagnosis present

## 2022-12-10 DIAGNOSIS — R0682 Tachypnea, not elsewhere classified: Secondary | ICD-10-CM | POA: Diagnosis not present

## 2022-12-10 DIAGNOSIS — E875 Hyperkalemia: Secondary | ICD-10-CM | POA: Diagnosis not present

## 2022-12-10 DIAGNOSIS — J210 Acute bronchiolitis due to respiratory syncytial virus: Secondary | ICD-10-CM | POA: Diagnosis present

## 2022-12-10 DIAGNOSIS — R0602 Shortness of breath: Secondary | ICD-10-CM | POA: Diagnosis not present

## 2022-12-10 DIAGNOSIS — I5033 Acute on chronic diastolic (congestive) heart failure: Secondary | ICD-10-CM | POA: Diagnosis present

## 2022-12-10 DIAGNOSIS — Z992 Dependence on renal dialysis: Secondary | ICD-10-CM | POA: Diagnosis not present

## 2022-12-10 DIAGNOSIS — E785 Hyperlipidemia, unspecified: Secondary | ICD-10-CM | POA: Diagnosis not present

## 2022-12-10 DIAGNOSIS — Z7984 Long term (current) use of oral hypoglycemic drugs: Secondary | ICD-10-CM

## 2022-12-10 DIAGNOSIS — Z7982 Long term (current) use of aspirin: Secondary | ICD-10-CM

## 2022-12-10 DIAGNOSIS — J9621 Acute and chronic respiratory failure with hypoxia: Secondary | ICD-10-CM | POA: Diagnosis present

## 2022-12-10 DIAGNOSIS — R52 Pain, unspecified: Secondary | ICD-10-CM | POA: Diagnosis not present

## 2022-12-10 DIAGNOSIS — Z7951 Long term (current) use of inhaled steroids: Secondary | ICD-10-CM

## 2022-12-10 DIAGNOSIS — E1122 Type 2 diabetes mellitus with diabetic chronic kidney disease: Secondary | ICD-10-CM | POA: Diagnosis not present

## 2022-12-10 DIAGNOSIS — K219 Gastro-esophageal reflux disease without esophagitis: Secondary | ICD-10-CM | POA: Diagnosis present

## 2022-12-10 DIAGNOSIS — N186 End stage renal disease: Secondary | ICD-10-CM | POA: Diagnosis present

## 2022-12-10 DIAGNOSIS — Z888 Allergy status to other drugs, medicaments and biological substances status: Secondary | ICD-10-CM

## 2022-12-10 DIAGNOSIS — E119 Type 2 diabetes mellitus without complications: Secondary | ICD-10-CM

## 2022-12-10 DIAGNOSIS — I272 Pulmonary hypertension, unspecified: Secondary | ICD-10-CM | POA: Diagnosis present

## 2022-12-10 DIAGNOSIS — Z853 Personal history of malignant neoplasm of breast: Secondary | ICD-10-CM | POA: Diagnosis not present

## 2022-12-10 DIAGNOSIS — D696 Thrombocytopenia, unspecified: Secondary | ICD-10-CM | POA: Diagnosis present

## 2022-12-10 DIAGNOSIS — J849 Interstitial pulmonary disease, unspecified: Secondary | ICD-10-CM | POA: Diagnosis present

## 2022-12-10 DIAGNOSIS — Z833 Family history of diabetes mellitus: Secondary | ICD-10-CM

## 2022-12-10 DIAGNOSIS — N189 Chronic kidney disease, unspecified: Secondary | ICD-10-CM | POA: Diagnosis not present

## 2022-12-10 DIAGNOSIS — I10 Essential (primary) hypertension: Secondary | ICD-10-CM | POA: Diagnosis not present

## 2022-12-10 DIAGNOSIS — R059 Cough, unspecified: Secondary | ICD-10-CM | POA: Diagnosis not present

## 2022-12-10 DIAGNOSIS — R062 Wheezing: Secondary | ICD-10-CM | POA: Diagnosis not present

## 2022-12-10 DIAGNOSIS — I129 Hypertensive chronic kidney disease with stage 1 through stage 4 chronic kidney disease, or unspecified chronic kidney disease: Secondary | ICD-10-CM | POA: Diagnosis not present

## 2022-12-10 DIAGNOSIS — Z1152 Encounter for screening for COVID-19: Secondary | ICD-10-CM | POA: Diagnosis not present

## 2022-12-10 DIAGNOSIS — Z6841 Body Mass Index (BMI) 40.0 and over, adult: Secondary | ICD-10-CM | POA: Diagnosis not present

## 2022-12-10 DIAGNOSIS — R748 Abnormal levels of other serum enzymes: Secondary | ICD-10-CM | POA: Diagnosis present

## 2022-12-10 DIAGNOSIS — Z90710 Acquired absence of both cervix and uterus: Secondary | ICD-10-CM

## 2022-12-10 DIAGNOSIS — N250 Renal osteodystrophy: Secondary | ICD-10-CM | POA: Diagnosis not present

## 2022-12-10 DIAGNOSIS — Z9012 Acquired absence of left breast and nipple: Secondary | ICD-10-CM

## 2022-12-10 LAB — RENAL FUNCTION PANEL
Albumin: 3.9 g/dL (ref 3.5–5.0)
Anion gap: 15 (ref 5–15)
BUN: 77 mg/dL — ABNORMAL HIGH (ref 6–20)
CO2: 21 mmol/L — ABNORMAL LOW (ref 22–32)
Calcium: 8.4 mg/dL — ABNORMAL LOW (ref 8.9–10.3)
Chloride: 100 mmol/L (ref 98–111)
Creatinine, Ser: 15.64 mg/dL — ABNORMAL HIGH (ref 0.44–1.00)
GFR, Estimated: 2 mL/min — ABNORMAL LOW (ref >60)
Glucose, Bld: 298 mg/dL — ABNORMAL HIGH (ref 70–99)
Phosphorus: 6.5 mg/dL — ABNORMAL HIGH (ref 2.5–4.6)
Potassium: 5.7 mmol/L — ABNORMAL HIGH (ref 3.5–5.1)
Sodium: 136 mmol/L (ref 135–145)

## 2022-12-10 LAB — BLOOD GAS, VENOUS
Acid-base deficit: 3.1 mmol/L — ABNORMAL HIGH (ref 0.0–2.0)
Bicarbonate: 25.3 mmol/L (ref 20.0–28.0)
Drawn by: 63741
O2 Saturation: 97.3 %
Patient temperature: 37.4
pCO2, Ven: 60 mmHg (ref 44–60)
pH, Ven: 7.23 — ABNORMAL LOW (ref 7.25–7.43)
pO2, Ven: 101 mmHg — ABNORMAL HIGH (ref 32–45)

## 2022-12-10 LAB — CBC
HCT: 30 % — ABNORMAL LOW (ref 36.0–46.0)
HCT: 28.7 % — ABNORMAL LOW (ref 36.0–46.0)
Hemoglobin: 9.2 g/dL — ABNORMAL LOW (ref 12.0–15.0)
Hemoglobin: 8.8 g/dL — ABNORMAL LOW (ref 12.0–15.0)
MCH: 29 pg (ref 26.0–34.0)
MCH: 28.9 pg (ref 26.0–34.0)
MCHC: 30.7 g/dL (ref 30.0–36.0)
MCHC: 30.7 g/dL (ref 30.0–36.0)
MCV: 94.6 fL (ref 80.0–100.0)
MCV: 94.4 fL (ref 80.0–100.0)
Platelets: 144 10*3/uL — ABNORMAL LOW (ref 150–400)
Platelets: 133 10*3/uL — ABNORMAL LOW (ref 150–400)
RBC: 3.17 MIL/uL — ABNORMAL LOW (ref 3.87–5.11)
RBC: 3.04 MIL/uL — ABNORMAL LOW (ref 3.87–5.11)
RDW: 15.3 % (ref 11.5–15.5)
RDW: 15.3 % (ref 11.5–15.5)
WBC: 10.5 10*3/uL (ref 4.0–10.5)
WBC: 11.5 10*3/uL — ABNORMAL HIGH (ref 4.0–10.5)
nRBC: 0 % (ref 0.0–0.2)
nRBC: 0 % (ref 0.0–0.2)

## 2022-12-10 LAB — TROPONIN I (HIGH SENSITIVITY)
Troponin I (High Sensitivity): 48 ng/L — ABNORMAL HIGH (ref <18)
Troponin I (High Sensitivity): 54 ng/L — ABNORMAL HIGH (ref <18)

## 2022-12-10 LAB — COMPREHENSIVE METABOLIC PANEL
ALT: 57 U/L — ABNORMAL HIGH (ref 0–44)
AST: 30 U/L (ref 15–41)
Albumin: 4 g/dL (ref 3.5–5.0)
Alkaline Phosphatase: 129 U/L — ABNORMAL HIGH (ref 38–126)
Anion gap: 14 (ref 5–15)
BUN: 71 mg/dL — ABNORMAL HIGH (ref 6–20)
CO2: 22 mmol/L (ref 22–32)
Calcium: 8.4 mg/dL — ABNORMAL LOW (ref 8.9–10.3)
Chloride: 99 mmol/L (ref 98–111)
Creatinine, Ser: 14.3 mg/dL — ABNORMAL HIGH (ref 0.44–1.00)
GFR, Estimated: 3 mL/min — ABNORMAL LOW (ref >60)
Glucose, Bld: 415 mg/dL — ABNORMAL HIGH (ref 70–99)
Potassium: 4.9 mmol/L (ref 3.5–5.1)
Sodium: 135 mmol/L (ref 135–145)
Total Bilirubin: 0.6 mg/dL (ref 0.3–1.2)
Total Protein: 7.1 g/dL (ref 6.5–8.1)

## 2022-12-10 LAB — RESP PANEL BY RT-PCR (RSV, FLU A&B, COVID)  RVPGX2
Influenza A by PCR: NEGATIVE
Influenza B by PCR: NEGATIVE
Resp Syncytial Virus by PCR: POSITIVE — AB
SARS Coronavirus 2 by RT PCR: NEGATIVE

## 2022-12-10 LAB — LIPASE, BLOOD: Lipase: 135 U/L — ABNORMAL HIGH (ref 11–51)

## 2022-12-10 LAB — HEPATITIS B SURFACE ANTIGEN: Hepatitis B Surface Ag: NONREACTIVE

## 2022-12-10 LAB — HCG, SERUM, QUALITATIVE: Preg, Serum: NEGATIVE

## 2022-12-10 LAB — GLUCOSE, CAPILLARY: Glucose-Capillary: 279 mg/dL — ABNORMAL HIGH (ref 70–99)

## 2022-12-10 LAB — CBG MONITORING, ED: Glucose-Capillary: 326 mg/dL — ABNORMAL HIGH (ref 70–99)

## 2022-12-10 MED ORDER — ROPINIROLE HCL ER 2 MG PO TB24: 2.0000 mg | ORAL_TABLET | Freq: Every day | ORAL | Status: DC

## 2022-12-10 MED ORDER — PANTOPRAZOLE SODIUM 40 MG PO TBEC
40.0000 mg | DELAYED_RELEASE_TABLET | Freq: Two times a day (BID) | ORAL | Status: DC
  Administered 2022-12-10 – 2022-12-14 (×8): 40 mg via ORAL
  Filled 2022-12-10 (×8): qty 1

## 2022-12-10 MED ORDER — LINACLOTIDE 145 MCG PO CAPS
290.0000 ug | ORAL_CAPSULE | Freq: Every day | ORAL | Status: DC
  Administered 2022-12-12 – 2022-12-14 (×3): 290 ug via ORAL
  Filled 2022-12-10 (×3): qty 2

## 2022-12-10 MED ORDER — BUDESONIDE 0.25 MG/2ML IN SUSP
0.2500 mg | Freq: Two times a day (BID) | RESPIRATORY_TRACT | Status: DC
  Administered 2022-12-10 – 2022-12-14 (×9): 0.25 mg via RESPIRATORY_TRACT
  Filled 2022-12-10 (×9): qty 2

## 2022-12-10 MED ORDER — ROSUVASTATIN CALCIUM 10 MG PO TABS
10.0000 mg | ORAL_TABLET | Freq: Every day | ORAL | Status: DC
  Administered 2022-12-11 – 2022-12-14 (×4): 10 mg via ORAL
  Filled 2022-12-10 (×4): qty 1

## 2022-12-10 MED ORDER — DOCUSATE SODIUM 100 MG PO CAPS
100.0000 mg | ORAL_CAPSULE | Freq: Every day | ORAL | Status: DC
  Administered 2022-12-11 – 2022-12-14 (×4): 100 mg via ORAL
  Filled 2022-12-10 (×4): qty 1

## 2022-12-10 MED ORDER — ACETAMINOPHEN 325 MG PO TABS
650.0000 mg | ORAL_TABLET | Freq: Four times a day (QID) | ORAL | Status: DC | PRN
  Administered 2022-12-11 – 2022-12-13 (×4): 650 mg via ORAL
  Filled 2022-12-10 (×4): qty 2

## 2022-12-10 MED ORDER — SEVELAMER CARBONATE 800 MG PO TABS
1600.0000 mg | ORAL_TABLET | Freq: Two times a day (BID) | ORAL | Status: DC
  Administered 2022-12-11 – 2022-12-14 (×7): 1600 mg via ORAL
  Filled 2022-12-10 (×7): qty 2

## 2022-12-10 MED ORDER — LIDOCAINE HCL (PF) 1 % IJ SOLN: 5.0000 mL | INTRAMUSCULAR | Status: DC | PRN

## 2022-12-10 MED ORDER — ASPIRIN 81 MG PO TBEC
DELAYED_RELEASE_TABLET | ORAL | Status: AC
  Filled 2022-12-10: qty 1

## 2022-12-10 MED ORDER — HEPARIN SODIUM (PORCINE) 1000 UNIT/ML DIALYSIS: 20.0000 [IU]/kg | INTRAMUSCULAR | Status: DC | PRN

## 2022-12-10 MED ORDER — METHYLPREDNISOLONE SODIUM SUCC 125 MG IJ SOLR
80.0000 mg | Freq: Two times a day (BID) | INTRAMUSCULAR | Status: DC
  Administered 2022-12-10 – 2022-12-13 (×6): 80 mg via INTRAVENOUS
  Filled 2022-12-10 (×6): qty 2

## 2022-12-10 MED ORDER — CINACALCET HCL 30 MG PO TABS
30.0000 mg | ORAL_TABLET | Freq: Every day | ORAL | Status: DC
  Administered 2022-12-11 – 2022-12-14 (×4): 30 mg via ORAL
  Filled 2022-12-10 (×4): qty 1

## 2022-12-10 MED ORDER — VITAMIN D 25 MCG (1000 UNIT) PO TABS
5000.0000 [IU] | ORAL_TABLET | Freq: Every day | ORAL | Status: DC
  Administered 2022-12-11 – 2022-12-14 (×4): 5000 [IU] via ORAL
  Filled 2022-12-10 (×4): qty 5

## 2022-12-10 MED ORDER — LORATADINE 10 MG PO TABS
10.0000 mg | ORAL_TABLET | Freq: Every day | ORAL | Status: DC
  Administered 2022-12-11 – 2022-12-14 (×4): 10 mg via ORAL
  Filled 2022-12-10 (×4): qty 1

## 2022-12-10 MED ORDER — ONDANSETRON HCL 4 MG/2ML IJ SOLN
4.0000 mg | Freq: Once | INTRAMUSCULAR | Status: AC
  Administered 2022-12-10: 4 mg via INTRAVENOUS

## 2022-12-10 MED ORDER — DM-GUAIFENESIN ER 30-600 MG PO TB12
1.0000 | ORAL_TABLET | Freq: Two times a day (BID) | ORAL | Status: DC
  Administered 2022-12-10 – 2022-12-14 (×8): 1 via ORAL
  Filled 2022-12-10 (×8): qty 1

## 2022-12-10 MED ORDER — INSULIN ASPART 100 UNIT/ML IJ SOLN
0.0000 [IU] | Freq: Three times a day (TID) | INTRAMUSCULAR | Status: DC
  Administered 2022-12-10: 11 [IU] via SUBCUTANEOUS
  Administered 2022-12-11: 5 [IU] via SUBCUTANEOUS
  Administered 2022-12-11: 2 [IU] via SUBCUTANEOUS
  Administered 2022-12-12: 15 [IU] via SUBCUTANEOUS
  Administered 2022-12-12: 8 [IU] via SUBCUTANEOUS
  Administered 2022-12-13: 5 [IU] via SUBCUTANEOUS
  Administered 2022-12-13: 15 [IU] via SUBCUTANEOUS
  Administered 2022-12-14: 5 [IU] via SUBCUTANEOUS
  Administered 2022-12-14: 3 [IU] via SUBCUTANEOUS
  Administered 2022-12-14: 11 [IU] via SUBCUTANEOUS
  Filled 2022-12-10: qty 1

## 2022-12-10 MED ORDER — HEPARIN SODIUM (PORCINE) 1000 UNIT/ML DIALYSIS
1000.0000 [IU] | INTRAMUSCULAR | Status: DC | PRN
  Filled 2022-12-10: qty 1

## 2022-12-10 MED ORDER — ORAL CARE MOUTH RINSE: 15.0000 mL | OROMUCOSAL | Status: DC | PRN

## 2022-12-10 MED ORDER — IPRATROPIUM-ALBUTEROL 0.5-2.5 (3) MG/3ML IN SOLN
RESPIRATORY_TRACT | Status: AC
  Filled 2022-12-10: qty 3

## 2022-12-10 MED ORDER — DOXYCYCLINE HYCLATE 100 MG PO TABS
100.0000 mg | ORAL_TABLET | Freq: Two times a day (BID) | ORAL | Status: DC
  Administered 2022-12-10 – 2022-12-14 (×8): 100 mg via ORAL
  Filled 2022-12-10 (×8): qty 1

## 2022-12-10 MED ORDER — INSULIN ASPART 100 UNIT/ML IJ SOLN
0.0000 [IU] | Freq: Every day | INTRAMUSCULAR | Status: DC
  Administered 2022-12-10 – 2022-12-13 (×3): 3 [IU] via SUBCUTANEOUS

## 2022-12-10 MED ORDER — SODIUM CHLORIDE 0.9 % IV SOLN: 250.0000 mL | INTRAVENOUS | Status: DC | PRN

## 2022-12-10 MED ORDER — SODIUM CHLORIDE 0.9% FLUSH: 3.0000 mL | INTRAVENOUS | Status: DC | PRN

## 2022-12-10 MED ORDER — SODIUM CHLORIDE 0.9% FLUSH
3.0000 mL | Freq: Two times a day (BID) | INTRAVENOUS | Status: DC
  Administered 2022-12-10 – 2022-12-14 (×8): 3 mL via INTRAVENOUS

## 2022-12-10 MED ORDER — FERRIC CITRATE 1 GM 210 MG(FE) PO TABS
420.0000 mg | ORAL_TABLET | Freq: Two times a day (BID) | ORAL | Status: DC
  Administered 2022-12-11 – 2022-12-14 (×7): 420 mg via ORAL
  Filled 2022-12-10 (×15): qty 2

## 2022-12-10 MED ORDER — ALTEPLASE 2 MG IJ SOLR
2.0000 mg | Freq: Once | INTRAMUSCULAR | Status: DC | PRN
  Filled 2022-12-10: qty 2

## 2022-12-10 MED ORDER — RENA-VITE PO TABS
1.0000 | ORAL_TABLET | Freq: Every day | ORAL | Status: DC
  Administered 2022-12-11 – 2022-12-14 (×4): 1 via ORAL
  Filled 2022-12-10 (×4): qty 1

## 2022-12-10 MED ORDER — IPRATROPIUM-ALBUTEROL 0.5-2.5 (3) MG/3ML IN SOLN
3.0000 mL | Freq: Once | RESPIRATORY_TRACT | Status: AC
  Administered 2022-12-10: 3 mL via RESPIRATORY_TRACT

## 2022-12-10 MED ORDER — ONDANSETRON HCL 4 MG PO TABS: 4.0000 mg | ORAL_TABLET | Freq: Four times a day (QID) | ORAL | Status: DC | PRN

## 2022-12-10 MED ORDER — ARFORMOTEROL TARTRATE 15 MCG/2ML IN NEBU
15.0000 ug | INHALATION_SOLUTION | Freq: Two times a day (BID) | RESPIRATORY_TRACT | Status: DC
  Administered 2022-12-10 – 2022-12-14 (×9): 15 ug via RESPIRATORY_TRACT
  Filled 2022-12-10 (×9): qty 2

## 2022-12-10 MED ORDER — ASPIRIN 81 MG PO TBEC
81.0000 mg | DELAYED_RELEASE_TABLET | Freq: Every day | ORAL | Status: DC
  Administered 2022-12-10 – 2022-12-14 (×4): 81 mg via ORAL
  Filled 2022-12-10 (×4): qty 1

## 2022-12-10 MED ORDER — CHLORHEXIDINE GLUCONATE CLOTH 2 % EX PADS
6.0000 | MEDICATED_PAD | Freq: Every day | CUTANEOUS | Status: DC
  Administered 2022-12-10 – 2022-12-14 (×4): 6 via TOPICAL

## 2022-12-10 MED ORDER — LIDOCAINE-PRILOCAINE 2.5-2.5 % EX CREA: 1.0000 | TOPICAL_CREAM | CUTANEOUS | Status: DC | PRN

## 2022-12-10 MED ORDER — NITROGLYCERIN 0.4 MG SL SUBL: 0.4000 mg | SUBLINGUAL_TABLET | SUBLINGUAL | Status: DC | PRN

## 2022-12-10 MED ORDER — SALINE SPRAY 0.65 % NA SOLN
1.0000 | NASAL | Status: DC | PRN
  Administered 2022-12-13: 1 via NASAL
  Filled 2022-12-10: qty 44

## 2022-12-10 MED ORDER — IPRATROPIUM-ALBUTEROL 0.5-2.5 (3) MG/3ML IN SOLN
3.0000 mL | Freq: Four times a day (QID) | RESPIRATORY_TRACT | Status: DC
  Administered 2022-12-10 – 2022-12-14 (×15): 3 mL via RESPIRATORY_TRACT
  Filled 2022-12-10 (×15): qty 3

## 2022-12-10 MED ORDER — PENTAFLUOROPROP-TETRAFLUOROETH EX AERO: 1.0000 | INHALATION_SPRAY | CUTANEOUS | Status: DC | PRN

## 2022-12-10 MED ORDER — ONDANSETRON HCL 4 MG/2ML IJ SOLN
4.0000 mg | Freq: Four times a day (QID) | INTRAMUSCULAR | Status: DC | PRN
  Administered 2022-12-11 – 2022-12-13 (×3): 4 mg via INTRAVENOUS
  Filled 2022-12-10 (×3): qty 2

## 2022-12-10 MED ORDER — LABETALOL HCL 100 MG PO TABS
150.0000 mg | ORAL_TABLET | Freq: Two times a day (BID) | ORAL | Status: DC
  Administered 2022-12-11 – 2022-12-13 (×5): 150 mg via ORAL
  Filled 2022-12-10 (×14): qty 1.5

## 2022-12-10 MED ORDER — MINOXIDIL 2.5 MG PO TABS
2.5000 mg | ORAL_TABLET | Freq: Two times a day (BID) | ORAL | Status: DC
  Administered 2022-12-11 (×2): 2.5 mg via ORAL
  Filled 2022-12-10 (×16): qty 1

## 2022-12-10 MED ORDER — MONTELUKAST SODIUM 10 MG PO TABS
10.0000 mg | ORAL_TABLET | Freq: Every day | ORAL | Status: DC
  Administered 2022-12-11 – 2022-12-13 (×3): 10 mg via ORAL
  Filled 2022-12-10 (×3): qty 1

## 2022-12-10 MED ORDER — ORAL CARE MOUTH RINSE
15.0000 mL | OROMUCOSAL | Status: DC
  Administered 2022-12-11 – 2022-12-14 (×15): 15 mL via OROMUCOSAL

## 2022-12-10 MED ORDER — ONDANSETRON HCL 4 MG/2ML IJ SOLN
INTRAMUSCULAR | Status: AC
  Filled 2022-12-10: qty 2

## 2022-12-10 MED ORDER — ANTICOAGULANT SODIUM CITRATE 4% (200MG/5ML) IV SOLN
5.0000 mL | Status: DC | PRN
  Filled 2022-12-10: qty 5

## 2022-12-10 NOTE — ED Notes (Signed)
Dinner tray given to pt

## 2022-12-10 NOTE — ED Provider Notes (Signed)
[facility] EMERGENCY DEPARTMENT Provider Note   CSN: [id number] Arrival date & time: 12/10/22  1226     History  Chief Complaint  Patient presents with   Shortness of Breath    Patricia [name] is a 51 y.o. female.   Shortness of Breath  Patient has a history of end-stage renal disease on dialysis, diabetes, hypertension bronchiolitis obliterans  Patient presents ED for evaluation of shortness of breath.  Patient states she has been having trouble with cough since Friday.  She went to dialysis and had treatment on Saturday.  They did extra treatments to try to pull off extra fluid.  Patient felt like she had improved somewhat.  Today however she started having increasing shortness of breath and coughing.  The symptoms are severe and she does not feel like she can breathe.  She has some discomfort in her abdomen with coughing but otherwise is not having any chest pain.  She has not had fevers.  Home Medications Prior to Admission medications   Medication Sig Start Date End Date Taking? Authorizing Provider  acetaminophen (TYLENOL) 325 MG tablet Take 650 mg by mouth every 6 (six) hours as needed.    [provider]  albuterol (PROVENTIL) (2.5 MG/3ML) 0.083% nebulizer solution Take 3 mLs (2.5 mg total) by nebulization every 6 (six) hours as needed for wheezing or shortness of breath. 11/28/17   Kathie Dike, MD  albuterol (VENTOLIN HFA) 108 (90 Base) MCG/ACT inhaler Inhale 2 puffs into the lungs every 6 (six) hours as needed for wheezing or shortness of breath. 06/28/22   Chesley Mires, MD  aspirin EC 81 MG tablet Take 1 tablet (81 mg total) by mouth daily. Swallow whole. 12/07/21   Fay Records, MD  azithromycin [facility]) 250 MG tablet 1 tab every Monday, Wednesday, and Friday 11/01/22   Chesley Mires, MD  cetirizine (ZYRTEC) 10 MG tablet Take 10 mg by mouth daily. 03/24/22   [provider]  Cholecalciferol (VITAMIN D3) 125 MCG (5000 UT) CAPS TAKE 1 CAPSULE BY MOUTH  EVERY DAY 09/13/22   Cassandria Anger, MD  cinacalcet (SENSIPAR) 60 MG tablet Take 30 mg by mouth daily with breakfast.    [provider]  dicyclomine (BENTYL) 10 MG capsule Take 1 capsule (10 mg total) by mouth 2 (two) times daily as needed. Patient taking differently: Take 10 mg by mouth 2 (two) times daily as needed for spasms. 03/20/22   Annitta Needs, NP  docusate sodium (COLACE) 100 MG capsule Take 100 mg by mouth daily.    [provider]  doxycycline (VIBRA-TABS) 100 MG tablet Take 1 tablet (100 mg total) by mouth 2 (two) times daily. 12/07/22   Collene Gobble, MD  ferric citrate (AURYXIA) 1 GM 210 MG(Fe) tablet Take 2 tablets by mouth 2 (two) times daily with a meal. With Breakfast & with supper    [provider]  glipiZIDE (GLUCOTROL) 10 MG tablet Take 15 mg by mouth 2 (two) times daily. 03/27/22   [provider]  ipratropium (ATROVENT) 0.03 % nasal spray Place 2 sprays into both nostrils 2 (two) times daily.  06/06/19   [provider]  labetalol (NORMODYNE) 100 MG tablet Take 1.5 tablets (150 mg total) by mouth 2 (two) times daily. 11/23/22 02/21/23  Barton Dubois, MD  linaclotide [facility]) 290 MCG CAPS capsule Take 290 mcg by mouth daily before breakfast.    [provider]  minoxidil (LONITEN) 2.5 MG tablet Take 1 tablet (2.5 mg total)  by mouth 2 (two) times daily. 11/23/22   Barton Dubois, MD  montelukast (SINGULAIR) 10 MG tablet TAKE 1 TABLET BY MOUTH EVERYDAY AT BEDTIME 09/13/22   Chesley Mires, MD  multivitamin (RENA-VIT) TABS tablet Take 1 tablet by mouth daily.    [provider]  nitroGLYCERIN (NITROSTAT) 0.4 MG SL tablet Place 1 tablet (0.4 mg total) under the tongue every 5 (five) minutes as needed for chest pain. 11/28/22 11/28/23  Rai, Ripudeep K, MD  ondansetron (ZOFRAN-ODT) 4 MG disintegrating tablet Take 1 tablet (4 mg total) by mouth as needed for nausea or vomiting. 11/23/22   Barton Dubois, MD  pantoprazole  (PROTONIX) 40 MG tablet Take 1 tablet (40 mg total) by mouth 2 (two) times daily. 30 minutes before breakfast 11/23/22   Barton Dubois, MD  predniSONE (DELTASONE) 10 MG tablet Take 3 tablets (30 mg total) by mouth daily with breakfast for 7 days, THEN 2 tablets (20 mg total) daily with breakfast for 7 days, THEN 1 tablet (10 mg total) daily with breakfast for 28 days. 11/23/22 01/04/23  Barton Dubois, MD  rOPINIRole (REQUIP XL) 2 MG 24 hr tablet Take 2 mg by mouth at bedtime.  09/10/16   [provider]  rosuvastatin (CRESTOR) 10 MG tablet Take 1 tablet (10 mg total) by mouth daily. 01/08/22   Fay Records, MD  sevelamer carbonate (RENVELA) 800 MG tablet Take 800 mg by mouth See admin instructions. Take 2 tablets (1600 mg) in the morning, take 3 tablets (2400 mg) with lunch or snack,  and take 2 tablets (1600 mg) by mouth in the evening with dinner meal.    [provider]  sodium chloride (OCEAN) 0.65 % SOLN nasal spray Place 1 spray into both nostrils as needed for congestion. 01/02/20   Hongalgi, Lenis Dickinson, MD  SYMBICORT 160-4.5 MCG/ACT inhaler Inhale 2 puffs into the lungs daily. 04/20/22   [provider]      Allergies    Amlodipine besylate and Reglan [metoclopramide]    Review of Systems   Review of Systems  Respiratory:  Positive for shortness of breath.     Physical Exam Updated Vital Signs BP (!) 143/78 (BP Location: Right Arm)   Pulse 85   Temp 98.9 F (37.2 C) (Oral)   Resp (!) 31   Ht 1.6 m (5\' 3" )   Wt 110.2 kg   SpO2 100%   BMI 43.05 kg/m  Physical Exam Vitals and nursing note reviewed.  Constitutional:      General: She is in acute distress.     Appearance: She is well-developed. She is ill-appearing.  HENT:     Head: Normocephalic and atraumatic.     Right Ear: External ear normal.     Left Ear: External ear normal.  Eyes:     General: No scleral icterus.       Right eye: No discharge.        Left eye: No discharge.      Conjunctiva/sclera: Conjunctivae normal.  Neck:     Trachea: No tracheal deviation.  Cardiovascular:     Rate and Rhythm: Normal rate and regular rhythm.  Pulmonary:     Effort: Tachypnea, accessory muscle usage and respiratory distress present.     Breath sounds: No stridor. Rales present. No wheezing.  Abdominal:     General: Bowel sounds are normal. There is no distension.     Palpations: Abdomen is soft.     Tenderness: There is no abdominal tenderness. There  is no guarding or rebound.  Musculoskeletal:        General: No tenderness or deformity.     Cervical back: Neck supple.     Right lower leg: Edema present.     Left lower leg: Edema present.  Skin:    General: Skin is warm and dry.     Findings: No rash.  Neurological:     General: No focal deficit present.     Mental Status: She is alert.     Cranial Nerves: No cranial nerve deficit, dysarthria or facial asymmetry.     Sensory: No sensory deficit.     Motor: No abnormal muscle tone or seizure activity.     Coordination: Coordination normal.  Psychiatric:        Mood and Affect: Mood normal.     ED Results / Procedures / Treatments   Labs (all labs ordered are listed, but only abnormal results are displayed) Labs Reviewed  RESP PANEL BY RT-PCR (RSV, FLU A&B, COVID)  RVPGX2 - Abnormal; Notable for the following components:      Result Value   Resp Syncytial Virus by PCR POSITIVE (*)    All other components within normal limits  COMPREHENSIVE METABOLIC PANEL - Abnormal; Notable for the following components:   Glucose, Bld 415 (*)    BUN 71 (*)    Creatinine, Ser 14.30 (*)    Calcium 8.4 (*)    ALT 57 (*)    Alkaline Phosphatase 129 (*)    GFR, Estimated 3 (*)    All other components within normal limits  CBC - Abnormal; Notable for the following components:   RBC 3.17 (*)    Hemoglobin 9.2 (*)    HCT 30.0 (*)    Platelets 144 (*)    All other components within normal limits  BLOOD GAS, VENOUS - Abnormal;  Notable for the following components:   pH, Ven 7.23 (*)    pO2, Ven 101 (*)    Acid-base deficit 3.1 (*)    All other components within normal limits  LIPASE, BLOOD - Abnormal; Notable for the following components:   Lipase 135 (*)    All other components within normal limits  TROPONIN I (HIGH SENSITIVITY) - Abnormal; Notable for the following components:   Troponin I (High Sensitivity) 48 (*)    All other components within normal limits  HCG, SERUM, QUALITATIVE  TROPONIN I (HIGH SENSITIVITY)    EKG EKG Interpretation  Date/Time:  Monday December 10 2022 12:35:32 EST Ventricular Rate:  98 PR Interval:  193 QRS Duration: 95 QT Interval:  346 QTC Calculation: 442 R Axis:   47 Text Interpretation: Sinus rhythm No significant change since last tracing Confirmed by Dorie Rank [id number]) on 12/10/2022 12:49:36 PM  Radiology DG Chest Port 1 View  Result Date: 12/10/2022 CLINICAL DATA:  Shortness of breath, cough EXAM: PORTABLE CHEST 1 VIEW COMPARISON:  Previous studies including the examination of 11/26/2022 FINDINGS: Transverse diameter of heart is increased. Central pulmonary vessels are prominent. Increased interstitial markings are seen in parahilar regions and right lower lung field. There is no focal consolidation. Costophrenic angles are clear. There is no pneumothorax. IMPRESSION: Cardiomegaly. Increased interstitial markings are seen in both parahilar regions and right lower lung fields suggesting possible interstitial edema or interstitial pneumonia. There is no focal pulmonary consolidation. Electronically Signed   By: Elmer Picker M.D.   On: 12/10/2022 13:44    Procedures .Critical Care  Performed by: Dorie Rank, MD Authorized by:  Dorie Rank, MD   Critical care provider statement:    Critical care time (minutes):  40     Medications Ordered in ED Medications  ondansetron [facility]) 4 MG/2ML injection (  Canceled Entry 12/10/22 1315)  ondansetron (ZOFRAN)  injection 4 mg (4 mg Intravenous Given 12/10/22 1313)    ED Course/ Medical Decision Making/ A&P Clinical Course as of 12/10/22 1507  Mon Dec 10, 2022  1422 Resp panel by RT-PCR (RSV, Flu A&B, Covid) Anterior Nasal Swab(!) RSV positive [JK]  1422 Comprehensive metabolic panel(!) Metabolic panel consistent with her known chronic renal failure [JK]  1422 CBC(!) CBC shows anemia [JK]  1422 Blood gas, venous(!) Blood gas did show decreased pH [JK]  1423 DG Chest Port 1 View Chest x-ray suggest component of interstitial edema [JK]  1451 Discussed with Dr. Arty Baumgartner.  No dialysis available here today.  Would need to be transferred if pt needs emergent dialysis.  Does not appear to need it at this time. [FG]  1829 Case discussed with Dr Manuella Ghazi regarding admission [JK]    Clinical Course User Index [JK] Dorie Rank, MD                           Medical Decision Making Problems Addressed: Acute pulmonary edema [place]): acute illness or injury that poses a threat to life or bodily functions Chronic kidney disease, unspecified CKD stage: chronic illness or injury with exacerbation, progression, or side effects of treatment RSV (acute bronchiolitis due to respiratory syncytial virus): acute illness or injury that poses a threat to life or bodily functions  Amount and/or Complexity of Data Reviewed Labs: ordered. Decision-making details documented in ED Course. Radiology: ordered and independent interpretation performed. Decision-making details documented in ED Course. Discussion of management or test interpretation with external provider(s): Case discussed with nephrology Dr. Marval Regal, and the hospitalist service Dr. Manuella Ghazi  Risk Prescription drug management. Decision regarding hospitalization.   Patient patient presented to the ED for evaluation of acute shortness of breath.  Patient has had some urinary symptoms recently.  She also has history of chronic kidney disease dialysis.  She states  she did have additional fluid drawn off on Saturday.  Initially she noted some improvement.  Today however her symptoms became much more severe again.  She has had persistent coughing.  ED workup is notable for chest x-ray suggesting component of interstitial edema.  Patient does appear somewhat fluid loaded on exam.  No signs of hyperkalemia however her severe acidosis to suggest need for emergent dialysis.  Patient's RSV is positive.  I suspect this is a significant component of her respiratory difficulty on top of her chronic lung disease.  Patient has improved with supportive care BiPAP.  Patient does complain of some abdominal discomfort but no significant focal tenderness.  Lipase is slightly elevated.  Presentation not suggestive of acute pancreatitis at this time, would continue to follow her labs.  With her respiratory difficulty RSV and component of pulmonary edema we will admit to the hospital for further treatment.       Final Clinical Impression(s) / ED Diagnoses Final diagnoses:  Acute pulmonary edema ([place])  Chronic kidney disease, unspecified CKD stage  RSV (acute bronchiolitis due to respiratory syncytial virus)    Rx / DC Orders ED Discharge Orders     None         Dorie Rank, MD 12/10/22 1507

## 2022-12-10 NOTE — H&P (Signed)
History and Physical    Mozella Rexrode DPO:[id number] DOB: 24-Sep-1971 DOA: 12/10/2022  PCP: Jake Samples, PA-C   Patient coming from: Home  Chief Complaint: Dyspnea  HPI: Patricia [name] is a 51 y.o. female with medical history significant for morbid obesity, OSA, ESRD on MWF on HD, type 2 diabetes, chronic diastolic heart failure, and ILD with chronic hypoxemia on 2 L nasal cannula who was recently discharged on 12/13 from [facility] after admission for evaluation of chest pain and elevated troponins.  She underwent right and left heart catheterization on 12/12 with no significant findings.  She follows with advanced heart failure clinic for pulmonary hypertension.  She states that she has been having worsening shortness of breath since this past Friday with labored breathing and cough.  She had her last hemodialysis treatment on Saturday and she had extra fluid pulled off and felt improved, but she continues to have ongoing symptoms.  She also had some mild discomfort in her abdomen with the coughing, but denies any chest pains, fevers, or chills.   ED Course: Vital signs demonstrating some tachypnea, but otherwise stable.  She was started on BiPAP and given some Zofran in the ED.  Hemoglobin at baseline and some thrombocytopenia noted.  Creatinine 14.3 you.  Troponin 48 and glucose 415.  Chest x-ray with cardiomegaly and some interstitial edema noted.  She is noted to be RSV positive.  EDP discussed with nephrology with plans for hemodialysis in a.m.  Review of Systems: Reviewed as noted above, otherwise negative.  Past Medical History:  Diagnosis Date   Anemia    Ankle fracture    Arthritis    Blood transfusion without reported diagnosis    Breast cancer ([place])    Cancer ([place])    Diabetes mellitus without complication ([place])    Dialysis patient ([place])    mon, wed, friday,    End stage renal disease on dialysis ([place])    M/W/F [facility] in [place]   GERD (gastroesophageal reflux  disease)    Hypertension    OSA (obstructive sleep apnea)    uses CPAP sometimes   Pneumonia    PONV (postoperative nausea and vomiting)    Wears glasses     Past Surgical History:  Procedure Laterality Date   ABDOMINAL HYSTERECTOMY     AV FISTULA PLACEMENT  11/2014   at [facility] N/A 04/2021   BALLOON DILATION N/A 07/10/2016   Procedure: BALLOON DILATION;  Surgeon: Danie Binder, MD;  [facility];  Service: Endoscopy;  Laterality: N/A;  Pyloric dilation   BREAST LUMPECTOMY     CESAREAN SECTION     CHOLECYSTECTOMY     COLONOSCOPY WITH PROPOFOL N/A 09/27/2016   Dr. Gala Romney: Internal hemorrhoids repeat colonoscopy in 10 years   DILATION AND CURETTAGE OF UTERUS     ESOPHAGOGASTRODUODENOSCOPY N/A 07/10/2016   Dr.Fields- normal esophagus, gastric stenosis was found at the pylorus, gastritis on bx, normal examined duodenun   ESOPHAGOGASTRODUODENOSCOPY (EGD) WITH PROPOFOL N/A 11/21/2022   Procedure: ESOPHAGOGASTRODUODENOSCOPY (EGD) WITH PROPOFOL;  Surgeon: Harvel Quale, MD;  [facility];  Service: Gastroenterology;  Laterality: N/A;   EXTERNAL FIXATION REMOVAL Right 10/29/2018   Procedure: REMOVAL RIGHT ANKLE BIOMET ZIMMER EXTERNAL FIXATOR, SHORT LEG CAST APPLICATION;  Surgeon: Marybelle Killings, MD;  Location: [place];  Service: Orthopedics;  Laterality: Right;   MASTECTOMY     left sided   ORIF ANKLE FRACTURE Right 10/06/2018   Procedure: OPEN  REDUCTION INTERNAL FIXATION (ORIF) RIGHT ANKLE TRIMALLEOLAR;  Surgeon: Marybelle Killings, MD;  Location: [place];  Service: Orthopedics;  Laterality: Right;   RIGHT/LEFT HEART CATH AND CORONARY ANGIOGRAPHY N/A 11/27/2022   Procedure: RIGHT/LEFT HEART CATH AND CORONARY ANGIOGRAPHY;  Surgeon: Martinique, Peter M, MD;  Location: [facility];  Service: Cardiovascular;  Laterality: N/A;     reports that she has never smoked. She has never used smokeless tobacco. She reports that she does not drink  alcohol and does not use drugs.  Allergies  Allergen Reactions   Amlodipine Besylate Rash and Other (See Comments)    dizziness   Reglan [Metoclopramide] Other (See Comments)    hallucinations     Family History  Problem Relation Age of Onset   Diabetes Mellitus II Mother    Hypertension Mother    Heart block Mother    Hypertension Sister    Hypertension Sister    Colon cancer Neg Hx     Prior to Admission medications   Medication Sig Start Date End Date Taking? Authorizing Provider  acetaminophen (TYLENOL) 325 MG tablet Take 650 mg by mouth every 6 (six) hours as needed.    [provider]  albuterol (PROVENTIL) (2.5 MG/3ML) 0.083% nebulizer solution Take 3 mLs (2.5 mg total) by nebulization every 6 (six) hours as needed for wheezing or shortness of breath. 11/28/17   Kathie Dike, MD  albuterol (VENTOLIN HFA) 108 (90 Base) MCG/ACT inhaler Inhale 2 puffs into the lungs every 6 (six) hours as needed for wheezing or shortness of breath. 06/28/22   Chesley Mires, MD  aspirin EC 81 MG tablet Take 1 tablet (81 mg total) by mouth daily. Swallow whole. 12/07/21   Fay Records, MD  azithromycin [facility]) 250 MG tablet 1 tab every Monday, Wednesday, and Friday 11/01/22   Chesley Mires, MD  cetirizine (ZYRTEC) 10 MG tablet Take 10 mg by mouth daily. 03/24/22   [provider]  Cholecalciferol (VITAMIN D3) 125 MCG (5000 UT) CAPS TAKE 1 CAPSULE BY MOUTH EVERY DAY 09/13/22   Cassandria Anger, MD  cinacalcet (SENSIPAR) 60 MG tablet Take 30 mg by mouth daily with breakfast.    [provider]  dicyclomine (BENTYL) 10 MG capsule Take 1 capsule (10 mg total) by mouth 2 (two) times daily as needed. Patient taking differently: Take 10 mg by mouth 2 (two) times daily as needed for spasms. 03/20/22   Annitta Needs, NP  docusate sodium (COLACE) 100 MG capsule Take 100 mg by mouth daily.    [provider]  doxycycline (VIBRA-TABS) 100 MG tablet Take 1 tablet (100  mg total) by mouth 2 (two) times daily. 12/07/22   Collene Gobble, MD  ferric citrate (AURYXIA) 1 GM 210 MG(Fe) tablet Take 2 tablets by mouth 2 (two) times daily with a meal. With Breakfast & with supper    [provider]  glipiZIDE (GLUCOTROL) 10 MG tablet Take 15 mg by mouth 2 (two) times daily. 03/27/22   [provider]  ipratropium (ATROVENT) 0.03 % nasal spray Place 2 sprays into both nostrils 2 (two) times daily.  06/06/19   [provider]  labetalol (NORMODYNE) 100 MG tablet Take 1.5 tablets (150 mg total) by mouth 2 (two) times daily. 11/23/22 02/21/23  Barton Dubois, MD  linaclotide [facility]) 290 MCG CAPS capsule Take 290 mcg by mouth daily before breakfast.    [provider]  minoxidil (LONITEN) 2.5 MG tablet Take 1 tablet (2.5 mg total)  by mouth 2 (two) times daily. 11/23/22   Barton Dubois, MD  montelukast (SINGULAIR) 10 MG tablet TAKE 1 TABLET BY MOUTH EVERYDAY AT BEDTIME 09/13/22   Chesley Mires, MD  multivitamin (RENA-VIT) TABS tablet Take 1 tablet by mouth daily.    [provider]  nitroGLYCERIN (NITROSTAT) 0.4 MG SL tablet Place 1 tablet (0.4 mg total) under the tongue every 5 (five) minutes as needed for chest pain. 11/28/22 11/28/23  Rai, Ripudeep K, MD  ondansetron (ZOFRAN-ODT) 4 MG disintegrating tablet Take 1 tablet (4 mg total) by mouth as needed for nausea or vomiting. 11/23/22   Barton Dubois, MD  pantoprazole (PROTONIX) 40 MG tablet Take 1 tablet (40 mg total) by mouth 2 (two) times daily. 30 minutes before breakfast 11/23/22   Barton Dubois, MD  predniSONE (DELTASONE) 10 MG tablet Take 3 tablets (30 mg total) by mouth daily with breakfast for 7 days, THEN 2 tablets (20 mg total) daily with breakfast for 7 days, THEN 1 tablet (10 mg total) daily with breakfast for 28 days. 11/23/22 01/04/23  Barton Dubois, MD  rOPINIRole (REQUIP XL) 2 MG 24 hr tablet Take 2 mg by mouth at bedtime.  09/10/16   [provider]  rosuvastatin  (CRESTOR) 10 MG tablet Take 1 tablet (10 mg total) by mouth daily. 01/08/22   Fay Records, MD  sevelamer carbonate (RENVELA) 800 MG tablet Take 800 mg by mouth See admin instructions. Take 2 tablets (1600 mg) in the morning, take 3 tablets (2400 mg) with lunch or snack,  and take 2 tablets (1600 mg) by mouth in the evening with dinner meal.    [provider]  sodium chloride (OCEAN) 0.65 % SOLN nasal spray Place 1 spray into both nostrils as needed for congestion. 01/02/20   Hongalgi, Lenis Dickinson, MD  SYMBICORT 160-4.5 MCG/ACT inhaler Inhale 2 puffs into the lungs daily. 04/20/22   [provider]    Physical Exam: Vitals:   12/10/22 1237 12/10/22 1239 12/10/22 1244 12/10/22 1453  BP:  (!) 161/91 (!) 161/91 (!) 143/78  Pulse:  (!) 105 96 85  Resp:  (!) 34 (!) 40 (!) 31  Temp: 99.3 F (37.4 C)   98.9 F (37.2 C)  TempSrc: Oral   Oral  SpO2: 100% 94% 100% 100%  Weight:      Height:        Constitutional: NAD, calm, comfortable, morbidly obese Vitals:   12/10/22 1237 12/10/22 1239 12/10/22 1244 12/10/22 1453  BP:  (!) 161/91 (!) 161/91 (!) 143/78  Pulse:  (!) 105 96 85  Resp:  (!) 34 (!) 40 (!) 31  Temp: 99.3 F (37.4 C)   98.9 F (37.2 C)  TempSrc: Oral   Oral  SpO2: 100% 94% 100% 100%  Weight:      Height:       Eyes: lids and conjunctivae normal Neck: normal, supple Respiratory: Wheezing noted bilaterally. Normal respiratory effort. No accessory muscle use.  Currently on nasal cannula Cardiovascular: Regular rate and rhythm, no murmurs. Abdomen: no tenderness, no distention. Bowel sounds positive.  Musculoskeletal:  No edema. Skin: no rashes, lesions, ulcers.  Psychiatric: Flat affect  Labs on Admission: I have personally reviewed following labs and imaging studies  CBC: Recent Labs  Lab 12/10/22 1247  WBC 10.5  HGB 9.2*  HCT 30.0*  MCV 94.6  PLT 751*   Basic Metabolic Panel: Recent Labs  Lab 12/10/22 1247  NA 135  K 4.9  CL 99  CO2  22   GLUCOSE 415*  BUN 71*  CREATININE 14.30*  CALCIUM 8.4*   GFR: Estimated Creatinine Clearance: 5.4 mL/min (A) (by C-G formula based on SCr of 14.3 mg/dL (H)). Liver Function Tests: Recent Labs  Lab 12/10/22 1247  AST 30  ALT 57*  ALKPHOS 129*  BILITOT 0.6  PROT 7.1  ALBUMIN 4.0   Recent Labs  Lab 12/10/22 1247  LIPASE 135*   No results for input(s): "AMMONIA" in the last 168 hours. Coagulation Profile: No results for input(s): "INR", "PROTIME" in the last 168 hours. Cardiac Enzymes: No results for input(s): "CKTOTAL", "CKMB", "CKMBINDEX", "TROPONINI" in the last 168 hours. BNP (last 3 results) No results for input(s): "PROBNP" in the last 8760 hours. HbA1C: No results for input(s): "HGBA1C" in the last 72 hours. CBG: No results for input(s): "GLUCAP" in the last 168 hours. Lipid Profile: No results for input(s): "CHOL", "HDL", "LDLCALC", "TRIG", "CHOLHDL", "LDLDIRECT" in the last 72 hours. Thyroid Function Tests: No results for input(s): "TSH", "T4TOTAL", "FREET4", "T3FREE", "THYROIDAB" in the last 72 hours. Anemia Panel: No results for input(s): "VITAMINB12", "FOLATE", "FERRITIN", "TIBC", "IRON", "RETICCTPCT" in the last 72 hours. Urine analysis:    Component Value Date/Time   COLORURINE STRAW (A) 12/22/2018 0847   APPEARANCEUR CLEAR 12/22/2018 0847   LABSPEC 1.007 12/22/2018 0847   PHURINE 9.0 (H) 12/22/2018 0847   GLUCOSEU >=500 (A) 12/22/2018 0847   HGBUR NEGATIVE 12/22/2018 0847   [place] 12/22/2018 [place] 12/22/2018 0847   PROTEINUR 100 (A) 12/22/2018 0847   NITRITE NEGATIVE 12/22/2018 0847   LEUKOCYTESUR NEGATIVE 12/22/2018 0847    Radiological Exams on Admission: DG Chest Port 1 View  Result Date: 12/10/2022 CLINICAL DATA:  Shortness of breath, cough EXAM: PORTABLE CHEST 1 VIEW COMPARISON:  Previous studies including the examination of 11/26/2022 FINDINGS: Transverse diameter of heart is increased. Central pulmonary  vessels are prominent. Increased interstitial markings are seen in parahilar regions and right lower lung field. There is no focal consolidation. Costophrenic angles are clear. There is no pneumothorax. IMPRESSION: Cardiomegaly. Increased interstitial markings are seen in both parahilar regions and right lower lung fields suggesting possible interstitial edema or interstitial pneumonia. There is no focal pulmonary consolidation. Electronically Signed   By: Elmer Picker M.D.   On: 12/10/2022 13:44    EKG: Independently reviewed.  98 bpm, NSR  Assessment/Plan Principal Problem:   Acute on chronic hypoxic respiratory failure (HCC) Active Problems:   Type 2 diabetes mellitus (HCC)   Essential hypertension   Acute on chronic diastolic CHF (congestive heart failure) (HCC)   Elevated troponin   OSA (obstructive sleep apnea)   Obesity, Class III, BMI 40-49.9 (morbid obesity) (HCC)   Bronchiolitis obliterans   ESRD needing dialysis ([place])    Acute on chronic hypoxemia-multifactorial -Currently on BiPAP and wears 2 L nasal cannula at baseline -Continue to wean back to baseline as tolerated  Acute on chronic diastolic CHF exacerbation -Related to volume overload -Recent left and right heart cath 12/12 with normal coronaries and moderate pulmonary hypertension noted -Plans for hemodialysis on 12/26 as discussed with nephrology -Continue BiPAP for now  RSV infection in the setting of ILD -Moderate pulmonary hypertension noted as above on recent cardiac catheterization -DuoNebs scheduled and Pulmicort -IV Solu-Medrol twice daily -Isolation precautions  Lipase elevation -Continue to monitor, but no concern for acute pancreatitis at this time  ESRD on HD MWF -Discussed with nephrology with plans for hemodialysis in a.m.  Chronic anemia of ESRD -Continue  to monitor H/H, currently at baseline  Hypertension -Continue home labetalol and minoxidil  Type 2 diabetes with  hyperglycemia -Hold home medications and maintain on carb modified diet -SSI with careful coverage while on IV steroids  Dyslipidemia -Continue rosuvastatin  GERD -Continue PPI daily  Morbid obesity/OSA -Maintain on BiPAP as noted above for now -BMI 43.05, lifestyle changes outpatient   DVT prophylaxis: SCDs Code Status: Full Family Communication: Mother at bedside 12/25 Disposition Plan: Admit for RSV treatment and HD Consults called:Nephrology Admission status: Inpatient, SDU  Severity of Illness: The appropriate patient status for this patient is INPATIENT. Inpatient status is judged to be reasonable and necessary in order to provide the required intensity of service to ensure the patient's safety. The patient's presenting symptoms, physical exam findings, and initial radiographic and laboratory data in the context of their chronic comorbidities is felt to place them at high risk for further clinical deterioration. Furthermore, it is not anticipated that the patient will be medically stable for discharge from the hospital within 2 midnights of admission.   * I certify that at the point of admission it is my clinical judgment that the patient will require inpatient hospital care spanning beyond 2 midnights from the point of admission due to high intensity of service, high risk for further deterioration and high frequency of surveillance required.*   Aster Eckrich D Adasyn Mcadams DO [facility]  If 7PM-7AM, please contact night-coverage [email or web address]  12/10/2022, 3:06 PM

## 2022-12-10 NOTE — ED Notes (Signed)
Dr. Tomi Bamberger and respiratory at bedside during triage. Pt went to dialysis on Sat.

## 2022-12-10 NOTE — ED Triage Notes (Signed)
Pt c/o SOB since Friday but today it got worse. Pt has laboured breathing and coughing. Pt stated she has pneumonia and has been on an [place].

## 2022-12-11 DIAGNOSIS — J9621 Acute and chronic respiratory failure with hypoxia: Secondary | ICD-10-CM | POA: Diagnosis not present

## 2022-12-11 LAB — COMPREHENSIVE METABOLIC PANEL
ALT: 48 U/L — ABNORMAL HIGH (ref 0–44)
AST: 24 U/L (ref 15–41)
Albumin: 3.7 g/dL (ref 3.5–5.0)
Alkaline Phosphatase: 103 U/L (ref 38–126)
Anion gap: 15 (ref 5–15)
BUN: 79 mg/dL — ABNORMAL HIGH (ref 6–20)
CO2: 20 mmol/L — ABNORMAL LOW (ref 22–32)
Calcium: 8.2 mg/dL — ABNORMAL LOW (ref 8.9–10.3)
Chloride: 102 mmol/L (ref 98–111)
Creatinine, Ser: 15.74 mg/dL — ABNORMAL HIGH (ref 0.44–1.00)
GFR, Estimated: 2 mL/min — ABNORMAL LOW (ref >60)
Glucose, Bld: 234 mg/dL — ABNORMAL HIGH (ref 70–99)
Potassium: 5.9 mmol/L — ABNORMAL HIGH (ref 3.5–5.1)
Sodium: 137 mmol/L (ref 135–145)
Total Bilirubin: 0.7 mg/dL (ref 0.3–1.2)
Total Protein: 6.7 g/dL (ref 6.5–8.1)

## 2022-12-11 LAB — CBC
HCT: 28.8 % — ABNORMAL LOW (ref 36.0–46.0)
Hemoglobin: 8.5 g/dL — ABNORMAL LOW (ref 12.0–15.0)
MCH: 28.5 pg (ref 26.0–34.0)
MCHC: 29.5 g/dL — ABNORMAL LOW (ref 30.0–36.0)
MCV: 96.6 fL (ref 80.0–100.0)
Platelets: 119 10*3/uL — ABNORMAL LOW (ref 150–400)
RBC: 2.98 MIL/uL — ABNORMAL LOW (ref 3.87–5.11)
RDW: 15.3 % (ref 11.5–15.5)
WBC: 11.7 10*3/uL — ABNORMAL HIGH (ref 4.0–10.5)
nRBC: 0 % (ref 0.0–0.2)

## 2022-12-11 LAB — GLUCOSE, CAPILLARY
Glucose-Capillary: 205 mg/dL — ABNORMAL HIGH (ref 70–99)
Glucose-Capillary: 150 mg/dL — ABNORMAL HIGH (ref 70–99)
Glucose-Capillary: 106 mg/dL — ABNORMAL HIGH (ref 70–99)
Glucose-Capillary: 253 mg/dL — ABNORMAL HIGH (ref 70–99)

## 2022-12-11 LAB — MAGNESIUM: Magnesium: 1.8 mg/dL (ref 1.7–2.4)

## 2022-12-11 MED ORDER — DICYCLOMINE HCL 10 MG PO CAPS: 10.0000 mg | ORAL_CAPSULE | Freq: Two times a day (BID) | ORAL | Status: DC | PRN

## 2022-12-11 MED ORDER — OXYCODONE HCL 5 MG PO TABS: 5.0000 mg | ORAL_TABLET | Freq: Once | ORAL | Status: DC

## 2022-12-11 MED ORDER — INSULIN DETEMIR 100 UNIT/ML ~~LOC~~ SOLN
8.0000 [IU] | Freq: Every day | SUBCUTANEOUS | Status: DC
  Administered 2022-12-11 – 2022-12-13 (×3): 8 [IU] via SUBCUTANEOUS
  Filled 2022-12-11 (×4): qty 0.08

## 2022-12-11 MED ORDER — ROPINIROLE HCL 1 MG PO TABS
1.0000 mg | ORAL_TABLET | Freq: Two times a day (BID) | ORAL | Status: DC
  Administered 2022-12-11 – 2022-12-13 (×6): 1 mg via ORAL
  Filled 2022-12-11 (×7): qty 1

## 2022-12-11 MED ORDER — SEVELAMER CARBONATE 800 MG PO TABS
2400.0000 mg | ORAL_TABLET | Freq: Every day | ORAL | Status: DC
  Administered 2022-12-11 – 2022-12-14 (×4): 2400 mg via ORAL
  Filled 2022-12-11 (×4): qty 3

## 2022-12-11 NOTE — TOC Initial Note (Signed)
Transition of Care [facility]) - Initial/Assessment Note    Patient Details  Name: Patricia [name] MRN: [id number] Date of Birth: 18-Jan-1971  [facility]) CM/SW Contact:    Iona Beard, [place] Phone Number: 12/11/2022, 1:42 PM  Clinical Narrative:                 Pt is high risk for readmission. CSW spoke with pt to complete assessment. Pt states that she her son lives with her. Pt states that she is independent in completing her ADLs. Pt states that she has transportation to appointments when needed. Pt states that she had HH once in the past but not currently. Pt states that she does not use a cane or walker. Pt has home O2. TOC to follow.   Expected Discharge Plan: Home/Self Care Barriers to Discharge: Continued Medical Work up   Patient Goals and CMS Choice Patient states their goals for this hospitalization and ongoing recovery are:: return home CMS Medicare.gov Compare Post Acute Care list provided to:: Patient Choice offered to / list presented to : Patient      Expected Discharge Plan and Services In-house Referral: Clinical Social Work Discharge Planning Services: CM Consult   Living arrangements for the past 2 months: Single Family Home                                      Prior Living Arrangements/Services Living arrangements for the past 2 months: Single Family Home Lives with:: Adult Children Patient language and need for interpreter reviewed:: Yes Do you feel safe going back to the place where you live?: Yes      Need for Family Participation in Patient Care: Yes (Comment) Care giver support system in place?: Yes (comment)   Criminal Activity/Legal Involvement Pertinent to Current Situation/Hospitalization: No - Comment as needed  Activities of Daily Living Home Assistive Devices/Equipment: None ADL Screening (condition at time of admission) Patient's cognitive ability adequate to safely complete daily activities?: Yes Is the patient deaf  or have difficulty hearing?: No Does the patient have difficulty seeing, even when wearing glasses/contacts?: No Does the patient have difficulty concentrating, remembering, or making decisions?: No Patient able to express need for assistance with ADLs?: Yes Does the patient have difficulty dressing or bathing?: Yes Independently performs ADLs?: Yes (appropriate for developmental age) Communication: Independent Dressing (OT): Independent Is this a change from baseline?: Pre-admission baseline Grooming: Independent Feeding: Independent Bathing: Independent Toileting: Independent In/Out Bed: Independent Walks in Home: Independent Does the patient have difficulty walking or climbing stairs?: No Weakness of Legs: None Weakness of Arms/Hands: Left  Permission Sought/Granted                  Emotional Assessment Appearance:: Appears stated age Attitude/Demeanor/Rapport: Engaged Affect (typically observed): Accepting Orientation: : Oriented to Self, Oriented to Place, Oriented to  Time, Oriented to Situation Alcohol / Substance Use: Not Applicable Psych Involvement: No (comment)  Admission diagnosis:  Acute pulmonary edema (HCC) [J81.0] RSV (acute bronchiolitis due to respiratory syncytial virus) [J21.0] Chronic kidney disease, unspecified CKD stage [N18.9] Acute on chronic hypoxic respiratory failure (HCC) [J96.21] Patient Active Problem List   Diagnosis Date Noted   Acute on chronic hypoxic respiratory failure ([place]) 12/10/2022   Precordial chest pain 11/26/2022   ESRD needing dialysis ([place]) 11/20/2022   Obesity, Class III, BMI 40-49.9 (morbid obesity) ([place]) 11/19/2022   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[place]) 11/19/2022   Bronchiolitis obliterans 11/19/2022   Post-traumatic osteoarthritis, right ankle and foot 08/31/2022   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   Elevated alkaline phosphatase level 05/24/2022   Right foot sprain 12/20/2020   Contusion of left  shoulder 12/20/2020   Syncope 08/03/2020   Acute pulmonary edema ([place]) 01/11/2020   OSA (obstructive sleep apnea)    Closed right ankle fracture 10/06/2018   Closed displaced trimalleolar fracture of right ankle 10/06/2018   Acute colitis 02/26/2018   Hypoxemia 02/24/2018   Dyspnea 11/26/2017   Acute on chronic anemia 11/26/2017   Elevated troponin 11/26/2017   Hyperkalemia 09/29/2017   Acute on chronic respiratory failure with hypoxia ([place]) 09/28/2017   CAP (community acquired pneumonia) 09/26/2017   Volume overload 09/25/2017   Acute on chronic diastolic CHF (congestive heart failure) ([place]) 09/25/2017   Lactic acidosis 09/25/2017   Hypokalemia 09/25/2017   Breast cancer ([place]) 07/22/2017   SOB (shortness of breath)    Acute respiratory failure with hypoxia ([place]) 07/21/2017   Flatulence 10/25/2016   GERD (gastroesophageal reflux disease) 08/02/2016   Pyloric stenosis, acquired    Nausea with vomiting    Generalized abdominal pain    Chest pain, rule out acute myocardial infarction 07/04/2016   Constipation 07/04/2016   Nausea 07/04/2016   Essential hypertension 07/04/2016   HCAP (healthcare-associated pneumonia) 12/04/2015   ESRD on dialysis ([place]) 12/04/2015   Type 2 diabetes mellitus ([place]) 12/04/2015   PCP:  Jake Samples, PA-C Pharmacy:   [id number] [place], [address] [place] [address] Phone: [phone] Fax: [phone]     Social Determinants of Health (SDOH) Social History: SDOH Screenings   Food Insecurity: No Food Insecurity (12/10/2022)  Housing: Low Risk  (12/10/2022)  Transportation Needs: No Transportation Needs (12/10/2022)  Utilities: Not At Risk (12/10/2022)  Tobacco Use: Low Risk  (12/10/2022)   SDOH Interventions: Housing Interventions: Intervention Not Indicated   Readmission Risk Interventions    12/11/2022    1:40 PM  Readmission Risk Prevention Plan  Transportation  Screening Complete  Medication Review Press photographer) Complete  HRI or [place] Complete  SW Recovery Care/Counseling Consult Complete  Palliative Care Screening Not [place] Not Applicable

## 2022-12-11 NOTE — Progress Notes (Signed)
PROGRESS NOTE    Patricia [name]  GLO:[id number] DOB: 05-26-1971 DOA: 12/10/2022 PCP: Jake Samples, PA-C   Brief Narrative:    Patricia [name] is a 51 y.o. female with medical history significant for morbid obesity, OSA, ESRD on MWF on HD, type 2 diabetes, chronic diastolic heart failure, and ILD with chronic hypoxemia on 2 L nasal cannula who was recently discharged on 12/13 from [facility] after admission for evaluation of chest pain and elevated troponins.  She underwent right and left heart catheterization on 12/12 with no significant findings.  She returns with worsening shortness of breath in the setting of RSV infection as well as volume overload.  Hemodialysis planned for 12/26.  Assessment & Plan:   Principal Problem:   Acute on chronic hypoxic respiratory failure (HCC) Active Problems:   Type 2 diabetes mellitus (HCC)   Essential hypertension   Acute on chronic diastolic CHF (congestive heart failure) (HCC)   Elevated troponin   OSA (obstructive sleep apnea)   Obesity, Class III, BMI 40-49.9 (morbid obesity) (HCC)   Bronchiolitis obliterans   ESRD needing dialysis ([place])  Assessment and Plan:   Acute on chronic hypoxemia-multifactorial -Currently on BiPAP and wears 2 L nasal cannula at baseline -Continue to wean back to baseline as tolerated, currently on BiPAP   Acute on chronic diastolic CHF exacerbation -Related to volume overload -Recent left and right heart cath 12/12 with normal coronaries and moderate pulmonary hypertension noted -Plans for hemodialysis today -Continue BiPAP for now   RSV infection in the setting of ILD -Moderate pulmonary hypertension noted as above on recent cardiac catheterization -DuoNebs scheduled and Pulmicort -IV Solu-Medrol twice daily -Isolation precautions   Lipase elevation -Continue to monitor, but no concern for acute pancreatitis at this time   ESRD on HD MWF -Discussed with nephrology with plans for hemodialysis  today  Hyperkalemia -Hemodialysis planned for today   Chronic anemia of ESRD -Continue to monitor H/H, currently at baseline   Hypertension -Continue home labetalol and minoxidil   Type 2 diabetes with hyperglycemia -Hold home medications and maintain on carb modified diet -SSI with careful coverage while on IV steroids   Dyslipidemia -Continue rosuvastatin   GERD -Continue PPI daily   Morbid obesity/OSA -Maintain on BiPAP as noted above for now -BMI 43.05, lifestyle changes outpatient    DVT prophylaxis: SCDs Code Status: Full Family Communication: Mother at bedside 12/26 Disposition Plan:  Status is: Inpatient Remains inpatient appropriate because: Need for IV medications.  Consultants:  Nephrology  Procedures:  None  Antimicrobials:  None   Subjective: Patient seen and evaluated today with ongoing dyspnea and cough as well as chest tightness.  She remains on BiPAP with no acute overnight events noted.  Objective: Vitals:   12/11/22 0558 12/11/22 0600 12/11/22 0900 12/11/22 0929  BP:  (!) 176/85 (!) 164/64   Pulse: 72 71 73   Resp: (!) 27 (!) 24 (!) 26   Temp:      TempSrc:      SpO2: 99% 99% 96% 98%  Weight:      Height:       No intake or output data in the 24 hours ending 12/11/22 1201 Filed Weights   12/10/22 1234 12/10/22 2100 12/11/22 0500  Weight: 110.2 kg 111 kg 112.4 kg    Examination:  General exam: Appears calm and comfortable, obese Respiratory system: Clear to auscultation. Respiratory effort increased and patient on BiPAP Cardiovascular system: S1 & S2 heard, RRR.  Gastrointestinal system: Abdomen is  soft Central nervous system: Alert and awake Extremities: No edema Skin: No significant lesions noted Psychiatry: Flat affect.    Data Reviewed: I have personally reviewed following labs and imaging studies  CBC: Recent Labs  Lab 12/10/22 1247 12/10/22 2001 12/11/22 0351  WBC 10.5 11.5* 11.7*  HGB 9.2* 8.8* 8.5*  HCT  30.0* 28.7* 28.8*  MCV 94.6 94.4 96.6  PLT 144* 133* 025*   Basic Metabolic Panel: Recent Labs  Lab 12/10/22 1247 12/10/22 2001 12/11/22 0351  NA 135 136 137  K 4.9 5.7* 5.9*  CL 99 100 102  CO2 22 21* 20*  GLUCOSE 415* 298* 234*  BUN 71* 77* 79*  CREATININE 14.30* 15.64* 15.74*  CALCIUM 8.4* 8.4* 8.2*  MG  --   --  1.8  PHOS  --  6.5*  --    GFR: Estimated Creatinine Clearance: 5 mL/min (A) (by C-G formula based on SCr of 15.74 mg/dL (H)). Liver Function Tests: Recent Labs  Lab 12/10/22 1247 12/10/22 2001 12/11/22 0351  AST 30  --  24  ALT 57*  --  48*  ALKPHOS 129*  --  103  BILITOT 0.6  --  0.7  PROT 7.1  --  6.7  ALBUMIN 4.0 3.9 3.7   Recent Labs  Lab 12/10/22 1247  LIPASE 135*   No results for input(s): "AMMONIA" in the last 168 hours. Coagulation Profile: No results for input(s): "INR", "PROTIME" in the last 168 hours. Cardiac Enzymes: No results for input(s): "CKTOTAL", "CKMB", "CKMBINDEX", "TROPONINI" in the last 168 hours. BNP (last 3 results) No results for input(s): "PROBNP" in the last 8760 hours. HbA1C: No results for input(s): "HGBA1C" in the last 72 hours. CBG: Recent Labs  Lab 12/10/22 1718 12/10/22 2139 12/11/22 0744  GLUCAP 326* 279* 205*   Lipid Profile: No results for input(s): "CHOL", "HDL", "LDLCALC", "TRIG", "CHOLHDL", "LDLDIRECT" in the last 72 hours. Thyroid Function Tests: No results for input(s): "TSH", "T4TOTAL", "FREET4", "T3FREE", "THYROIDAB" in the last 72 hours. Anemia Panel: No results for input(s): "VITAMINB12", "FOLATE", "FERRITIN", "TIBC", "IRON", "RETICCTPCT" in the last 72 hours. Sepsis Labs: No results for input(s): "PROCALCITON", "LATICACIDVEN" in the last 168 hours.  Recent Results (from the past 240 hour(s))  Resp panel by RT-PCR (RSV, Flu A&B, Covid) Anterior Nasal Swab     Status: Abnormal   Collection Time: 12/10/22 12:47 PM   Specimen: Anterior Nasal Swab  Result Value Ref Range Status   SARS  Coronavirus 2 by RT PCR NEGATIVE NEGATIVE Final    Comment: (NOTE) SARS-CoV-2 target nucleic acids are NOT DETECTED.  The SARS-CoV-2 RNA is generally detectable in upper respiratory specimens during the acute phase of infection. The lowest concentration of SARS-CoV-2 viral copies this assay can detect is 138 copies/mL. A negative result does not preclude SARS-Cov-2 infection and should not be used as the sole basis for treatment or other patient management decisions. A negative result may occur with  improper specimen collection/handling, submission of specimen other than nasopharyngeal swab, presence of viral mutation(s) within the areas targeted by this assay, and inadequate number of viral copies(<138 copies/mL). A negative result must be combined with clinical observations, patient history, and epidemiological information. The expected result is Negative.  Fact Sheet for Patients:  EntrepreneurPulse.com.au  Fact Sheet for Healthcare Providers:  IncredibleEmployment.be  This test is no t yet approved or cleared by the [place] FDA and  has been authorized for detection and/or diagnosis of SARS-CoV-2 by FDA under an Emergency Use Authorization (EUA).  This EUA will remain  in effect (meaning this test can be used) for the duration of the COVID-19 declaration under Section 564(b)(1) of the Act, 21 U.S.C.section 360bbb-3(b)(1), unless the authorization is terminated  or revoked sooner.       Influenza A by PCR NEGATIVE NEGATIVE Final   Influenza B by PCR NEGATIVE NEGATIVE Final    Comment: (NOTE) The Xpert Xpress SARS-CoV-2/FLU/RSV plus assay is intended as an aid in the diagnosis of influenza from Nasopharyngeal swab specimens and should not be used as a sole basis for treatment. Nasal washings and aspirates are unacceptable for Xpert Xpress SARS-CoV-2/FLU/RSV testing.  Fact Sheet for  Patients: EntrepreneurPulse.com.au  Fact Sheet for Healthcare Providers: IncredibleEmployment.be  This test is not yet approved or cleared by the [place] FDA and has been authorized for detection and/or diagnosis of SARS-CoV-2 by FDA under an Emergency Use Authorization (EUA). This EUA will remain in effect (meaning this test can be used) for the duration of the COVID-19 declaration under Section 564(b)(1) of the Act, 21 U.S.C. section 360bbb-3(b)(1), unless the authorization is terminated or revoked.     Resp Syncytial Virus by PCR POSITIVE (A) NEGATIVE Final    Comment: (NOTE) Fact Sheet for Patients: EntrepreneurPulse.com.au  Fact Sheet for Healthcare Providers: IncredibleEmployment.be  This test is not yet approved or cleared by the [place] FDA and has been authorized for detection and/or diagnosis of SARS-CoV-2 by FDA under an Emergency Use Authorization (EUA). This EUA will remain in effect (meaning this test can be used) for the duration of the COVID-19 declaration under Section 564(b)(1) of the Act, 21 U.S.C. section 360bbb-3(b)(1), unless the authorization is terminated or revoked.  Performed at [facility], [address], [place], [place] [address]          Radiology Studies: [facility] 1 View  Result Date: 12/10/2022 CLINICAL DATA:  Shortness of breath, cough EXAM: PORTABLE CHEST 1 VIEW COMPARISON:  Previous studies including the examination of 11/26/2022 FINDINGS: Transverse diameter of heart is increased. Central pulmonary vessels are prominent. Increased interstitial markings are seen in parahilar regions and right lower lung field. There is no focal consolidation. Costophrenic angles are clear. There is no pneumothorax. IMPRESSION: Cardiomegaly. Increased interstitial markings are seen in both parahilar regions and right lower lung fields suggesting possible interstitial  edema or interstitial pneumonia. There is no focal pulmonary consolidation. Electronically Signed   By: Elmer Picker M.D.   On: 12/10/2022 13:44        Scheduled Meds:  arformoterol  15 mcg Nebulization BID   aspirin EC  81 mg Oral Daily   budesonide (PULMICORT) nebulizer solution  0.25 mg Nebulization BID   Chlorhexidine Gluconate Cloth  6 each Topical Q0600   cinacalcet  30 mg Oral Q breakfast   dextromethorphan-guaiFENesin  1 tablet Oral BID   docusate sodium  100 mg Oral Daily   doxycycline  100 mg Oral BID   ferric citrate  420 mg Oral BID WC   insulin aspart  0-15 Units Subcutaneous TID WC   insulin aspart  0-5 Units Subcutaneous QHS   insulin detemir  8 Units Subcutaneous Daily   ipratropium-albuterol  3 mL Nebulization Q6H   labetalol  150 mg Oral BID   linaclotide  290 mcg Oral QAC breakfast   loratadine  10 mg Oral Daily   methylPREDNISolone (SOLU-MEDROL) injection  80 mg Intravenous Q12H   minoxidil  2.5 mg Oral BID   montelukast  10 mg Oral QHS  multivitamin  1 tablet Oral Daily   mouth rinse  15 mL Mouth Rinse 4 times per day   pantoprazole  40 mg Oral BID   rOPINIRole  1 mg Oral BID   rosuvastatin  10 mg Oral Daily   sevelamer carbonate  800 mg Oral See admin instructions   sodium chloride flush  3 mL Intravenous Q12H   Vitamin D3  5,000 Units Oral Daily   Continuous Infusions:  sodium chloride     anticoagulant sodium citrate       LOS: 1 day    Time spent: 35 minutes    Dawanna Grauberger Darleen Crocker, DO [facility]  If 7PM-7AM, please contact night-coverage [email or web address] 12/11/2022, 12:01 PM

## 2022-12-11 NOTE — Progress Notes (Signed)
Patient ID: Patricia [name], female   DOB: February 11, 1971, 51 y.o.   MRN: [id number] S: Pt well known to our service from previous hospitalization, who presented to [facility] ED on 12/10/22 with worsening SOB for the past 3 days.  She did have HD on 12/08/22 and is due today for HD due to the holiday schedule.  In the ED, CXR with cardiomegaly and interstitial edema, also + for RSV.  We were again consulted to provide HD during her hospitalization O:BP (!) 176/85   Pulse 71   Temp 97.6 F (36.4 C) (Oral)   Resp (!) 24   Ht 5\' 3"  (1.6 m)   Wt 112.4 kg   SpO2 99%   BMI 43.90 kg/m  No intake or output data in the 24 hours ending 12/11/22 0835 Intake/Output: No intake/output data recorded.  Intake/Output this shift:  No intake/output data recorded. Weight change:  Gen: NAD wearing bipap CVS:RRR Resp:CTA Abd:+BS, soft, obese Ext: no edema, LUE AVF +T/B  Recent Labs  Lab 12/10/22 1247 12/10/22 2001 12/11/22 0351  NA 135 136 137  K 4.9 5.7* 5.9*  CL 99 100 102  CO2 22 21* 20*  GLUCOSE 415* 298* 234*  BUN 71* 77* 79*  CREATININE 14.30* 15.64* 15.74*  ALBUMIN 4.0 3.9 3.7  CALCIUM 8.4* 8.4* 8.2*  PHOS  --  6.5*  --   AST 30  --  24  ALT 57*  --  48*   Liver Function Tests: Recent Labs  Lab 12/10/22 1247 12/10/22 2001 12/11/22 0351  AST 30  --  24  ALT 57*  --  48*  ALKPHOS 129*  --  103  BILITOT 0.6  --  0.7  PROT 7.1  --  6.7  ALBUMIN 4.0 3.9 3.7   Recent Labs  Lab 12/10/22 1247  LIPASE 135*   No results for input(s): "AMMONIA" in the last 168 hours. CBC: Recent Labs  Lab 12/10/22 1247 12/10/22 2001 12/11/22 0351  WBC 10.5 11.5* 11.7*  HGB 9.2* 8.8* 8.5*  HCT 30.0* 28.7* 28.8*  MCV 94.6 94.4 96.6  PLT 144* 133* 119*   Cardiac Enzymes: No results for input(s): "CKTOTAL", "CKMB", "CKMBINDEX", "TROPONINI" in the last 168 hours. CBG: Recent Labs  Lab 12/10/22 1718 12/10/22 2139 12/11/22 0744  GLUCAP 326* 279* 205*    Iron Studies: No results for input(s):  "IRON", "TIBC", "TRANSFERRIN", "FERRITIN" in the last 72 hours. Studies/Results: DG Chest Port 1 View  Result Date: 12/10/2022 CLINICAL DATA:  Shortness of breath, cough EXAM: PORTABLE CHEST 1 VIEW COMPARISON:  Previous studies including the examination of 11/26/2022 FINDINGS: Transverse diameter of heart is increased. Central pulmonary vessels are prominent. Increased interstitial markings are seen in parahilar regions and right lower lung field. There is no focal consolidation. Costophrenic angles are clear. There is no pneumothorax. IMPRESSION: Cardiomegaly. Increased interstitial markings are seen in both parahilar regions and right lower lung fields suggesting possible interstitial edema or interstitial pneumonia. There is no focal pulmonary consolidation. Electronically Signed   By: Elmer Picker M.D.   On: 12/10/2022 13:44    arformoterol  15 mcg Nebulization BID   aspirin EC  81 mg Oral Daily   budesonide (PULMICORT) nebulizer solution  0.25 mg Nebulization BID   Chlorhexidine Gluconate Cloth  6 each Topical Q0600   cinacalcet  30 mg Oral Q breakfast   dextromethorphan-guaiFENesin  1 tablet Oral BID   docusate sodium  100 mg Oral Daily   doxycycline  100 mg Oral BID  ferric citrate  420 mg Oral BID WC   insulin aspart  0-15 Units Subcutaneous TID WC   insulin aspart  0-5 Units Subcutaneous QHS   ipratropium-albuterol  3 mL Nebulization Q6H   labetalol  150 mg Oral BID   linaclotide  290 mcg Oral QAC breakfast   loratadine  10 mg Oral Daily   methylPREDNISolone (SOLU-MEDROL) injection  80 mg Intravenous Q12H   minoxidil  2.5 mg Oral BID   montelukast  10 mg Oral QHS   multivitamin  1 tablet Oral Daily   mouth rinse  15 mL Mouth Rinse 4 times per day   pantoprazole  40 mg Oral BID   rOPINIRole  2 mg Oral QHS   rosuvastatin  10 mg Oral Daily   sevelamer carbonate  800 mg Oral See admin instructions   sodium chloride flush  3 mL Intravenous Q12H   Vitamin D3  5,000 Units  Oral Daily    BMET    Component Value Date/Time   NA 137 12/11/2022 0351   K 5.9 (H) 12/11/2022 0351   CL 102 12/11/2022 0351   CO2 20 (L) 12/11/2022 0351   GLUCOSE 234 (H) 12/11/2022 0351   BUN 79 (H) 12/11/2022 0351   CREATININE 15.74 (H) 12/11/2022 0351   CREATININE 5.93 (H) 06/29/2019 1210   CALCIUM 8.2 (L) 12/11/2022 0351   GFRNONAA 2 (L) 12/11/2022 0351   GFRNONAA 8 (L) 06/29/2019 1210   GFRAA 4 (L) 08/04/2020 0452   GFRAA 9 (L) 06/29/2019 1210   CBC    Component Value Date/Time   WBC 11.7 (H) 12/11/2022 0351   RBC 2.98 (L) 12/11/2022 0351   HGB 8.5 (L) 12/11/2022 0351   HCT 28.8 (L) 12/11/2022 0351   PLT 119 (L) 12/11/2022 0351   MCV 96.6 12/11/2022 0351   MCH 28.5 12/11/2022 0351   MCHC 29.5 (L) 12/11/2022 0351   RDW 15.3 12/11/2022 0351   LYMPHSABS 0.6 (L) 11/21/2022 0427   MONOABS 0.4 11/21/2022 0427   EOSABS 0.0 11/21/2022 0427   BASOSABS 0.0 11/21/2022 0427    OP HD:  [facility] MWF From 2022  >> 4h 51min 400/600  Hep 1000+ 1800/hr  2/2.5 bath L AVF , EDW 110kg   Assessment/Plan:   Acute on chronic hypoxic respiratory failure - pt with moderate pulmonary HTN by RHC, no CAD by LHC.  Plan for HD with UF and follow response ESRD - normally MWF at [facility].  Completed HD on 12/08/22 due to holiday schedule.  Will plan for HD again today and likely tomorrow to help with volume if needed HTN/Volume - as above, will UF as tolerated Hyperkalemia - plan for HD Anemia of ESRD - cont with ESA.  Transfuse prn. ILD - known bronchiolitis obliterans.  On O2 via [place], MD [facility]

## 2022-12-11 NOTE — Procedures (Signed)
   HEMODIALYSIS TREATMENT NOTE:  3.5 hour low-heparin treatment completed using LUE AVF (15g/antegrade).  Goal was met, although pt did c/o cramping in her legs and feet.  3.1 liters removed.  All blood was returned and hemostasis was achieved in 30 minutes.  Hand-off given to Rulon Sera, RN.   Rockwell Alexandria, RN

## 2022-12-11 NOTE — Progress Notes (Signed)
Inpatient Diabetes Program Recommendations  AACE/ADA: New Consensus Statement on Inpatient Glycemic Control (2015)  Target Ranges:  Prepandial:   less than 140 mg/dL      Peak postprandial:   less than 180 mg/dL (1-2 hours)      Critically ill patients:  140 - 180 mg/dL   Lab Results  Component Value Date   GLUCAP 279 (H) 12/10/2022   HGBA1C 9.5 (H) 11/19/2022    Review of Glycemic Control  Latest Reference Range & Units 12/10/22 17:18 12/10/22 21:39  Glucose-Capillary 70 - 99 mg/dL 326 (H) 279 (H)   Diabetes history: DM 2 Outpatient Diabetes medications: Glipizide 15 mg bid Current orders for Inpatient glycemic control:  Novolog 0-15 units tid + hs  Solumedrol 80 mg Q12 hours A1c 9.5% on 12/4 Diabetes Coordinator spoke with pt on 12/5. Pt on recent steroids  Inpatient Diabetes Program Recommendations:    -  Add Levemir 8 units while on steroids.  Thanks,  Tama Headings RN, MSN, BC-ADM Inpatient Diabetes Coordinator Team Pager [phone] (8a-5p)

## 2022-12-12 DIAGNOSIS — J9621 Acute and chronic respiratory failure with hypoxia: Secondary | ICD-10-CM | POA: Diagnosis not present

## 2022-12-12 LAB — MAGNESIUM: Magnesium: 1.6 mg/dL — ABNORMAL LOW (ref 1.7–2.4)

## 2022-12-12 LAB — BASIC METABOLIC PANEL
Anion gap: 15 (ref 5–15)
BUN: 54 mg/dL — ABNORMAL HIGH (ref 6–20)
CO2: 27 mmol/L (ref 22–32)
Calcium: 8.4 mg/dL — ABNORMAL LOW (ref 8.9–10.3)
Chloride: 95 mmol/L — ABNORMAL LOW (ref 98–111)
Creatinine, Ser: 10.68 mg/dL — ABNORMAL HIGH (ref 0.44–1.00)
GFR, Estimated: 4 mL/min — ABNORMAL LOW (ref >60)
Glucose, Bld: 220 mg/dL — ABNORMAL HIGH (ref 70–99)
Potassium: 4.4 mmol/L (ref 3.5–5.1)
Sodium: 137 mmol/L (ref 135–145)

## 2022-12-12 LAB — CBC
HCT: 28 % — ABNORMAL LOW (ref 36.0–46.0)
Hemoglobin: 8.4 g/dL — ABNORMAL LOW (ref 12.0–15.0)
MCH: 28.2 pg (ref 26.0–34.0)
MCHC: 30 g/dL (ref 30.0–36.0)
MCV: 94 fL (ref 80.0–100.0)
Platelets: 124 10*3/uL — ABNORMAL LOW (ref 150–400)
RBC: 2.98 MIL/uL — ABNORMAL LOW (ref 3.87–5.11)
RDW: 15.3 % (ref 11.5–15.5)
WBC: 9.1 10*3/uL (ref 4.0–10.5)
nRBC: 0 % (ref 0.0–0.2)

## 2022-12-12 LAB — GLUCOSE, CAPILLARY
Glucose-Capillary: 293 mg/dL — ABNORMAL HIGH (ref 70–99)
Glucose-Capillary: 387 mg/dL — ABNORMAL HIGH (ref 70–99)
Glucose-Capillary: 259 mg/dL — ABNORMAL HIGH (ref 70–99)
Glucose-Capillary: 196 mg/dL — ABNORMAL HIGH (ref 70–99)

## 2022-12-12 LAB — HEPATITIS B SURFACE ANTIBODY, QUANTITATIVE: Hep B S AB Quant (Post): 77.9 m[IU]/mL (ref >9.9)

## 2022-12-12 LAB — LIPASE, BLOOD: Lipase: 61 U/L — ABNORMAL HIGH (ref 11–51)

## 2022-12-12 MED ORDER — MAGNESIUM SULFATE IN D5W 1-5 GM/100ML-% IV SOLN
1.0000 g | Freq: Once | INTRAVENOUS | Status: AC
  Administered 2022-12-12: 1 g via INTRAVENOUS
  Filled 2022-12-12: qty 100

## 2022-12-12 NOTE — Progress Notes (Signed)
Patient ID: Patricia [name], female   DOB: 06-Feb-1971, 51 y.o.   MRN: [id number] S: Had some nausea this morning. O:BP (!) 160/67   Pulse 80   Temp 98.4 F (36.9 C) (Axillary)   Resp 17   Ht 5\' 3"  (1.6 m)   Wt 107 kg   SpO2 95%   BMI 41.79 kg/m   Intake/Output Summary (Last 24 hours) at 12/12/2022 1005 Last data filed at 12/11/2022 1545 Gross per 24 hour  Intake --  Output 3100 ml  Net -3100 ml   Intake/Output: I/O last 3 completed shifts: In: -  Out: 3100 [Other:3100]  Intake/Output this shift:  No intake/output data recorded. Weight change: -0.524 kg Gen: Sitting up in chair wearing nasal canula, NAD CVS: RRR Resp:Scattered rhonchi and wheezes bilaterally Abd: obese, +BS, soft, NT/ND Ext: no edema, LUE AVF +T/B  Recent Labs  Lab 12/10/22 1247 12/10/22 2001 12/11/22 0351 12/12/22 0435  NA 135 136 137 137  K 4.9 5.7* 5.9* 4.4  CL 99 100 102 95*  CO2 22 21* 20* 27  GLUCOSE 415* 298* 234* 220*  BUN 71* 77* 79* 54*  CREATININE 14.30* 15.64* 15.74* 10.68*  ALBUMIN 4.0 3.9 3.7  --   CALCIUM 8.4* 8.4* 8.2* 8.4*  PHOS  --  6.5*  --   --   AST 30  --  24  --   ALT 57*  --  48*  --    Liver Function Tests: Recent Labs  Lab 12/10/22 1247 12/10/22 2001 12/11/22 0351  AST 30  --  24  ALT 57*  --  48*  ALKPHOS 129*  --  103  BILITOT 0.6  --  0.7  PROT 7.1  --  6.7  ALBUMIN 4.0 3.9 3.7   Recent Labs  Lab 12/10/22 1247 12/12/22 0435  LIPASE 135* 61*   No results for input(s): "AMMONIA" in the last 168 hours. CBC: Recent Labs  Lab 12/10/22 1247 12/10/22 2001 12/11/22 0351 12/12/22 0435  WBC 10.5 11.5* 11.7* 9.1  HGB 9.2* 8.8* 8.5* 8.4*  HCT 30.0* 28.7* 28.8* 28.0*  MCV 94.6 94.4 96.6 94.0  PLT 144* 133* 119* 124*   Cardiac Enzymes: No results for input(s): "CKTOTAL", "CKMB", "CKMBINDEX", "TROPONINI" in the last 168 hours. CBG: Recent Labs  Lab 12/11/22 0744 12/11/22 1247 12/11/22 1610 12/11/22 2122 12/12/22 0740  GLUCAP 205* 150* 106* 253*  293*    Iron Studies: No results for input(s): "IRON", "TIBC", "TRANSFERRIN", "FERRITIN" in the last 72 hours. Studies/Results: DG Chest Port 1 View  Result Date: 12/10/2022 CLINICAL DATA:  Shortness of breath, cough EXAM: PORTABLE CHEST 1 VIEW COMPARISON:  Previous studies including the examination of 11/26/2022 FINDINGS: Transverse diameter of heart is increased. Central pulmonary vessels are prominent. Increased interstitial markings are seen in parahilar regions and right lower lung field. There is no focal consolidation. Costophrenic angles are clear. There is no pneumothorax. IMPRESSION: Cardiomegaly. Increased interstitial markings are seen in both parahilar regions and right lower lung fields suggesting possible interstitial edema or interstitial pneumonia. There is no focal pulmonary consolidation. Electronically Signed   By: Elmer Picker M.D.   On: 12/10/2022 13:44    arformoterol  15 mcg Nebulization BID   aspirin EC  81 mg Oral Daily   budesonide (PULMICORT) nebulizer solution  0.25 mg Nebulization BID   Chlorhexidine Gluconate Cloth  6 each Topical Q0600   cholecalciferol  5,000 Units Oral Daily   cinacalcet  30 mg Oral Q breakfast   dextromethorphan-guaiFENesin  1 tablet Oral BID   docusate sodium  100 mg Oral Daily   doxycycline  100 mg Oral BID   ferric citrate  420 mg Oral BID WC   insulin aspart  0-15 Units Subcutaneous TID WC   insulin aspart  0-5 Units Subcutaneous QHS   insulin detemir  8 Units Subcutaneous Daily   ipratropium-albuterol  3 mL Nebulization Q6H   labetalol  150 mg Oral BID   linaclotide  290 mcg Oral QAC breakfast   loratadine  10 mg Oral Daily   methylPREDNISolone (SOLU-MEDROL) injection  80 mg Intravenous Q12H   minoxidil  2.5 mg Oral BID   montelukast  10 mg Oral QHS   multivitamin  1 tablet Oral Daily   mouth rinse  15 mL Mouth Rinse 4 times per day   oxyCODONE  5 mg Oral Once   pantoprazole  40 mg Oral BID   rOPINIRole  1 mg Oral BID    rosuvastatin  10 mg Oral Daily   sevelamer carbonate  1,600 mg Oral BID WC   sevelamer carbonate  2,400 mg Oral Q lunch   sodium chloride flush  3 mL Intravenous Q12H    BMET    Component Value Date/Time   NA 137 12/12/2022 0435   K 4.4 12/12/2022 0435   CL 95 (L) 12/12/2022 0435   CO2 27 12/12/2022 0435   GLUCOSE 220 (H) 12/12/2022 0435   BUN 54 (H) 12/12/2022 0435   CREATININE 10.68 (H) 12/12/2022 0435   CREATININE 5.93 (H) 06/29/2019 1210   CALCIUM 8.4 (L) 12/12/2022 0435   GFRNONAA 4 (L) 12/12/2022 0435   GFRNONAA 8 (L) 06/29/2019 1210   GFRAA 4 (L) 08/04/2020 0452   GFRAA 9 (L) 06/29/2019 1210   CBC    Component Value Date/Time   WBC 9.1 12/12/2022 0435   RBC 2.98 (L) 12/12/2022 0435   HGB 8.4 (L) 12/12/2022 0435   HCT 28.0 (L) 12/12/2022 0435   PLT 124 (L) 12/12/2022 0435   MCV 94.0 12/12/2022 0435   MCH 28.2 12/12/2022 0435   MCHC 30.0 12/12/2022 0435   RDW 15.3 12/12/2022 0435   LYMPHSABS 0.6 (L) 11/21/2022 0427   MONOABS 0.4 11/21/2022 0427   EOSABS 0.0 11/21/2022 0427   BASOSABS 0.0 11/21/2022 0427    OP HD:  [facility] MWF From 2022  >> 4h 14min 400/600  Hep 1000+ 1800/hr  2/2.5 bath L AVF , EDW 110kg   Assessment/Plan:   Acute on chronic hypoxic respiratory failure - pt with moderate pulmonary HTN by RHC, no CAD by LHC.  Improved with HD and UF of 3.5L yesterday.  Plan for HD with UF again today and follow response ESRD - normally MWF at [facility].  Completed HD on 12/08/22 due to holiday schedule.  Will plan for HD again today to get back on her regular schedule. 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  Transfuse prn. ILD - known bronchiolitis obliterans.  On O2 via [place], MD [facility]

## 2022-12-12 NOTE — Progress Notes (Signed)
PROGRESS NOTE    Patricia [name]  HOZ:[id number] DOB: February 09, 1971 DOA: 12/10/2022 PCP: Jake Samples, PA-C   Brief Narrative:    Patricia [name] is a 51 y.o. female with medical history significant for morbid obesity, OSA, ESRD on MWF on HD, type 2 diabetes, chronic diastolic heart failure, and ILD with chronic hypoxemia on 2 L nasal cannula who was recently discharged on 12/13 from [facility] after admission for evaluation of chest pain and elevated troponins.  She underwent right and left heart catheterization on 12/12 with no significant findings.  She returns with worsening shortness of breath in the setting of RSV infection as well as volume overload.  Hemodialysis performed 12/26 with repeat session planned 12/27.  Assessment & Plan:   Principal Problem:   Acute on chronic hypoxic respiratory failure (HCC) Active Problems:   Type 2 diabetes mellitus (HCC)   Essential hypertension   Acute on chronic diastolic CHF (congestive heart failure) (HCC)   Elevated troponin   OSA (obstructive sleep apnea)   Obesity, Class III, BMI 40-49.9 (morbid obesity) (HCC)   Bronchiolitis obliterans   ESRD needing dialysis ([place])  Assessment and Plan:   Acute on chronic hypoxemia-multifactorial -Currently on BiPAP and wears 2 L nasal cannula at baseline -Continue to wean back to baseline as tolerated, currently on BiPAP   Acute on chronic diastolic CHF exacerbation-improving with HD -Related to volume overload -Recent left and right heart cath 12/12 with normal coronaries and moderate pulmonary hypertension noted -Plans for repeat hemodialysis today -Weaned off BiPAP and okay for transfer to telemetry   RSV infection in the setting of ILD -Moderate pulmonary hypertension noted as above on recent cardiac catheterization -DuoNebs scheduled and Pulmicort -IV Solu-Medrol twice daily -Isolation precautions   Lipase elevation -Continue to monitor, but no concern for acute pancreatitis at this  time -Noted to have some emesis in the morning, will recheck today   ESRD on HD MWF -Discussed with nephrology with plans for hemodialysis today   Hypomagnesemia -Replete and reevaluate   Chronic anemia of ESRD -Continue to monitor H/H, currently at baseline   Hypertension -Continue home labetalol and minoxidil   Type 2 diabetes with hyperglycemia -Hold home medications and maintain on carb modified diet -SSI with careful coverage while on IV steroids   Dyslipidemia -Continue rosuvastatin   GERD -Continue PPI daily   Morbid obesity/OSA -Maintain on BiPAP as noted above for now -BMI 43.05, lifestyle changes outpatient     DVT prophylaxis: SCDs Code Status: Full Family Communication: Mother at bedside 12/27 Disposition Plan:  Status is: Inpatient Remains inpatient appropriate because: Need for IV medications.   Consultants:  Nephrology   Procedures:  None   Antimicrobials:  None   Subjective: Patient seen and evaluated today with improvements in overall shortness of breath and chest congestion noted today.  She has been taken off of BiPAP.  Noted to have some emesis this morning.  Objective: Vitals:   12/12/22 0800 12/12/22 0906 12/12/22 0911 12/12/22 0916  BP: (!) 160/67     Pulse: 80     Resp: 17     Temp:      TempSrc:      SpO2: 98% 96% 100% 95%  Weight:      Height:        Intake/Output Summary (Last 24 hours) at 12/12/2022 1059 Last data filed at 12/11/2022 1545 Gross per 24 hour  Intake --  Output 3100 ml  Net -3100 ml   [employer]  12/11/22 1130 12/11/22 1545 12/12/22 0500  Weight: 109.7 kg 107.2 kg 107 kg    Examination:  General exam: Appears calm and comfortable, obese Respiratory system: Clear to auscultation. Respiratory effort normal.  Currently on nasal cannula Cardiovascular system: S1 & S2 heard, RRR.  Gastrointestinal system: Abdomen is soft Central nervous system: Alert and awake Extremities: No edema Skin: No  significant lesions noted Psychiatry: Flat affect.    Data Reviewed: I have personally reviewed following labs and imaging studies  CBC: Recent Labs  Lab 12/10/22 1247 12/10/22 2001 12/11/22 0351 12/12/22 0435  WBC 10.5 11.5* 11.7* 9.1  HGB 9.2* 8.8* 8.5* 8.4*  HCT 30.0* 28.7* 28.8* 28.0*  MCV 94.6 94.4 96.6 94.0  PLT 144* 133* 119* 616*   Basic Metabolic Panel: Recent Labs  Lab 12/10/22 1247 12/10/22 2001 12/11/22 0351 12/12/22 0435  NA 135 136 137 137  K 4.9 5.7* 5.9* 4.4  CL 99 100 102 95*  CO2 22 21* 20* 27  GLUCOSE 415* 298* 234* 220*  BUN 71* 77* 79* 54*  CREATININE 14.30* 15.64* 15.74* 10.68*  CALCIUM 8.4* 8.4* 8.2* 8.4*  MG  --   --  1.8 1.6*  PHOS  --  6.5*  --   --    GFR: Estimated Creatinine Clearance: 7.1 mL/min (A) (by C-G formula based on SCr of 10.68 mg/dL (H)). Liver Function Tests: Recent Labs  Lab 12/10/22 1247 12/10/22 2001 12/11/22 0351  AST 30  --  24  ALT 57*  --  48*  ALKPHOS 129*  --  103  BILITOT 0.6  --  0.7  PROT 7.1  --  6.7  ALBUMIN 4.0 3.9 3.7   Recent Labs  Lab 12/10/22 1247 12/12/22 0435  LIPASE 135* 61*   No results for input(s): "AMMONIA" in the last 168 hours. Coagulation Profile: No results for input(s): "INR", "PROTIME" in the last 168 hours. Cardiac Enzymes: No results for input(s): "CKTOTAL", "CKMB", "CKMBINDEX", "TROPONINI" in the last 168 hours. BNP (last 3 results) No results for input(s): "PROBNP" in the last 8760 hours. HbA1C: No results for input(s): "HGBA1C" in the last 72 hours. CBG: Recent Labs  Lab 12/11/22 0744 12/11/22 1247 12/11/22 1610 12/11/22 2122 12/12/22 0740  GLUCAP 205* 150* 106* 253* 293*   Lipid Profile: No results for input(s): "CHOL", "HDL", "LDLCALC", "TRIG", "CHOLHDL", "LDLDIRECT" in the last 72 hours. Thyroid Function Tests: No results for input(s): "TSH", "T4TOTAL", "FREET4", "T3FREE", "THYROIDAB" in the last 72 hours. Anemia Panel: No results for input(s):  "VITAMINB12", "FOLATE", "FERRITIN", "TIBC", "IRON", "RETICCTPCT" in the last 72 hours. Sepsis Labs: No results for input(s): "PROCALCITON", "LATICACIDVEN" in the last 168 hours.  Recent Results (from the past 240 hour(s))  Resp panel by RT-PCR (RSV, Flu A&B, Covid) Anterior Nasal Swab     Status: Abnormal   Collection Time: 12/10/22 12:47 PM   Specimen: Anterior Nasal Swab  Result Value Ref Range Status   SARS Coronavirus 2 by RT PCR NEGATIVE NEGATIVE Final    Comment: (NOTE) SARS-CoV-2 target nucleic acids are NOT DETECTED.  The SARS-CoV-2 RNA is generally detectable in upper respiratory specimens during the acute phase of infection. The lowest concentration of SARS-CoV-2 viral copies this assay can detect is 138 copies/mL. A negative result does not preclude SARS-Cov-2 infection and should not be used as the sole basis for treatment or other patient management decisions. A negative result may occur with  improper specimen collection/handling, submission of specimen other than nasopharyngeal swab, presence of viral mutation(s) within  the areas targeted by this assay, and inadequate number of viral copies(<138 copies/mL). A negative result must be combined with clinical observations, patient history, and epidemiological information. The expected result is Negative.  Fact Sheet for Patients:  EntrepreneurPulse.com.au  Fact Sheet for Healthcare Providers:  IncredibleEmployment.be  This test is no t yet approved or cleared by the [place] FDA and  has been authorized for detection and/or diagnosis of SARS-CoV-2 by FDA under an Emergency Use Authorization (EUA). This EUA will remain  in effect (meaning this test can be used) for the duration of the COVID-19 declaration under Section 564(b)(1) of the Act, 21 U.S.C.section 360bbb-3(b)(1), unless the authorization is terminated  or revoked sooner.       Influenza A by PCR NEGATIVE NEGATIVE  Final   Influenza B by PCR NEGATIVE NEGATIVE Final    Comment: (NOTE) The Xpert Xpress SARS-CoV-2/FLU/RSV plus assay is intended as an aid in the diagnosis of influenza from Nasopharyngeal swab specimens and should not be used as a sole basis for treatment. Nasal washings and aspirates are unacceptable for Xpert Xpress SARS-CoV-2/FLU/RSV testing.  Fact Sheet for Patients: EntrepreneurPulse.com.au  Fact Sheet for Healthcare Providers: IncredibleEmployment.be  This test is not yet approved or cleared by the [place] FDA and has been authorized for detection and/or diagnosis of SARS-CoV-2 by FDA under an Emergency Use Authorization (EUA). This EUA will remain in effect (meaning this test can be used) for the duration of the COVID-19 declaration under Section 564(b)(1) of the Act, 21 U.S.C. section 360bbb-3(b)(1), unless the authorization is terminated or revoked.     Resp Syncytial Virus by PCR POSITIVE (A) NEGATIVE Final    Comment: (NOTE) Fact Sheet for Patients: EntrepreneurPulse.com.au  Fact Sheet for Healthcare Providers: IncredibleEmployment.be  This test is not yet approved or cleared by the [place] FDA and has been authorized for detection and/or diagnosis of SARS-CoV-2 by FDA under an Emergency Use Authorization (EUA). This EUA will remain in effect (meaning this test can be used) for the duration of the COVID-19 declaration under Section 564(b)(1) of the Act, 21 U.S.C. section 360bbb-3(b)(1), unless the authorization is terminated or revoked.  Performed at [facility], [address], [place], [place] [address]          Radiology Studies: [facility] Chest [facility] 1 View  Result Date: 12/10/2022 CLINICAL DATA:  Shortness of breath, cough EXAM: PORTABLE CHEST 1 VIEW COMPARISON:  Previous studies including the examination of 11/26/2022 FINDINGS: Transverse diameter of heart is increased.  Central pulmonary vessels are prominent. Increased interstitial markings are seen in parahilar regions and right lower lung field. There is no focal consolidation. Costophrenic angles are clear. There is no pneumothorax. IMPRESSION: Cardiomegaly. Increased interstitial markings are seen in both parahilar regions and right lower lung fields suggesting possible interstitial edema or interstitial pneumonia. There is no focal pulmonary consolidation. Electronically Signed   By: Elmer Picker M.D.   On: 12/10/2022 13:44        Scheduled Meds:  arformoterol  15 mcg Nebulization BID   aspirin EC  81 mg Oral Daily   budesonide (PULMICORT) nebulizer solution  0.25 mg Nebulization BID   Chlorhexidine Gluconate Cloth  6 each Topical Q0600   cholecalciferol  5,000 Units Oral Daily   cinacalcet  30 mg Oral Q breakfast   dextromethorphan-guaiFENesin  1 tablet Oral BID   docusate sodium  100 mg Oral Daily   doxycycline  100 mg Oral BID   ferric citrate  420 mg Oral BID  WC   insulin aspart  0-15 Units Subcutaneous TID WC   insulin aspart  0-5 Units Subcutaneous QHS   insulin detemir  8 Units Subcutaneous Daily   ipratropium-albuterol  3 mL Nebulization Q6H   labetalol  150 mg Oral BID   linaclotide  290 mcg Oral QAC breakfast   loratadine  10 mg Oral Daily   methylPREDNISolone (SOLU-MEDROL) injection  80 mg Intravenous Q12H   minoxidil  2.5 mg Oral BID   montelukast  10 mg Oral QHS   multivitamin  1 tablet Oral Daily   mouth rinse  15 mL Mouth Rinse 4 times per day   oxyCODONE  5 mg Oral Once   pantoprazole  40 mg Oral BID   rOPINIRole  1 mg Oral BID   rosuvastatin  10 mg Oral Daily   sevelamer carbonate  1,600 mg Oral BID WC   sevelamer carbonate  2,400 mg Oral Q lunch   sodium chloride flush  3 mL Intravenous Q12H   Continuous Infusions:  sodium chloride     anticoagulant sodium citrate     magnesium sulfate bolus IVPB 1 g (12/12/22 1014)     LOS: 2 days    Time spent: 35  minutes    Lashawn Orrego Darleen Crocker, DO [facility]  If 7PM-7AM, please contact night-coverage [email or web address] 12/12/2022, 10:59 AM

## 2022-12-12 NOTE — Inpatient Diabetes Management (Signed)
Inpatient Diabetes Program Recommendations  AACE/ADA: New Consensus Statement on Inpatient Glycemic Control (2015)  Target Ranges:  Prepandial:   less than 140 mg/dL      Peak postprandial:   less than 180 mg/dL (1-2 hours)      Critically ill patients:  140 - 180 mg/dL   Lab Results  Component Value Date   GLUCAP 293 (H) 12/12/2022   HGBA1C 9.5 (H) 11/19/2022    Review of Glycemic Control  Latest Reference Range & Units 12/11/22 07:44 12/11/22 12:47 12/11/22 16:10 12/11/22 21:22 12/12/22 07:40  Glucose-Capillary 70 - 99 mg/dL 205 (H) 150 (H) 106 (H) 253 (H) 293 (H)  (H): Data is abnormally high  Diabetes history: DM 2 Outpatient Diabetes medications: Glipizide 15 mg bid Current orders for Inpatient glycemic control:  Novolog 0-15 units tid + hs, Levemir 8 units QD  Inpatient Diabetes Program Recommendations:    While on steroids, please consider: Novolog 0-20 units TID and HS  Will continue to follow while inpatient.  Thank you, Reche Dixon, MSN, [place] Diabetes Coordinator Inpatient Diabetes Program [phone] (team pager from 8a-5p)

## 2022-12-12 NOTE — Progress Notes (Signed)
Patient has telemetry orders in, but patient is marked as med-surg. MD Manuella Ghazi notified. MD Manuella Ghazi states she will be fine off telemetry.

## 2022-12-12 NOTE — Progress Notes (Signed)
Received patient in bed to unit.  Alert and oriented.  Informed consent signed and in chart.   Treatment initiated: 1725 Treatment completed: 2055  Patient tolerated well.  Transported back to the room  Alert, without acute distress.  Hand-off given to patient's nurse.   Access used: AVF Access issues: none  Total UF removed: 3 L Medication(s) given: none Post HD VS: 115/58 P 88 R 20  Post HD weight: 104 KG   [facility]

## 2022-12-12 NOTE — Progress Notes (Signed)
Dialysis nurse was provided with ferric citrate, labetalol, and renvela to give this evening. This nurse took the patient her dinner and was going to administer insulin, but dialysis nurse states to hold for now.

## 2022-12-13 DIAGNOSIS — J9621 Acute and chronic respiratory failure with hypoxia: Secondary | ICD-10-CM | POA: Diagnosis not present

## 2022-12-13 LAB — CBC
HCT: 28.1 % — ABNORMAL LOW (ref 36.0–46.0)
Hemoglobin: 8.6 g/dL — ABNORMAL LOW (ref 12.0–15.0)
MCH: 28.4 pg (ref 26.0–34.0)
MCHC: 30.6 g/dL (ref 30.0–36.0)
MCV: 92.7 fL (ref 80.0–100.0)
Platelets: 170 10*3/uL (ref 150–400)
RBC: 3.03 MIL/uL — ABNORMAL LOW (ref 3.87–5.11)
RDW: 14.9 % (ref 11.5–15.5)
WBC: 11.2 10*3/uL — ABNORMAL HIGH (ref 4.0–10.5)
nRBC: 0 % (ref 0.0–0.2)

## 2022-12-13 LAB — BASIC METABOLIC PANEL
Anion gap: 13 (ref 5–15)
BUN: 46 mg/dL — ABNORMAL HIGH (ref 6–20)
CO2: 30 mmol/L (ref 22–32)
Calcium: 8.6 mg/dL — ABNORMAL LOW (ref 8.9–10.3)
Chloride: 95 mmol/L — ABNORMAL LOW (ref 98–111)
Creatinine, Ser: 8.1 mg/dL — ABNORMAL HIGH (ref 0.44–1.00)
GFR, Estimated: 5 mL/min — ABNORMAL LOW (ref >60)
Glucose, Bld: 248 mg/dL — ABNORMAL HIGH (ref 70–99)
Potassium: 4 mmol/L (ref 3.5–5.1)
Sodium: 138 mmol/L (ref 135–145)

## 2022-12-13 LAB — MAGNESIUM: Magnesium: 1.7 mg/dL (ref 1.7–2.4)

## 2022-12-13 LAB — GLUCOSE, CAPILLARY
Glucose-Capillary: 248 mg/dL — ABNORMAL HIGH (ref 70–99)
Glucose-Capillary: 361 mg/dL — ABNORMAL HIGH (ref 70–99)
Glucose-Capillary: 410 mg/dL — ABNORMAL HIGH (ref 70–99)
Glucose-Capillary: 291 mg/dL — ABNORMAL HIGH (ref 70–99)

## 2022-12-13 MED ORDER — METHYLPREDNISOLONE SODIUM SUCC 40 MG IJ SOLR
40.0000 mg | Freq: Two times a day (BID) | INTRAMUSCULAR | Status: DC
  Administered 2022-12-13 – 2022-12-14 (×3): 40 mg via INTRAVENOUS
  Filled 2022-12-13 (×3): qty 1

## 2022-12-13 MED ORDER — INSULIN ASPART 100 UNIT/ML IJ SOLN
18.0000 [IU] | Freq: Once | INTRAMUSCULAR | Status: AC
  Administered 2022-12-13: 18 [IU] via SUBCUTANEOUS

## 2022-12-13 MED ORDER — INSULIN DETEMIR 100 UNIT/ML ~~LOC~~ SOLN
12.0000 [IU] | Freq: Every day | SUBCUTANEOUS | Status: DC
  Administered 2022-12-14: 12 [IU] via SUBCUTANEOUS
  Filled 2022-12-13 (×2): qty 0.12

## 2022-12-13 MED ORDER — INSULIN ASPART 100 UNIT/ML IJ SOLN
2.0000 [IU] | Freq: Three times a day (TID) | INTRAMUSCULAR | Status: DC
  Administered 2022-12-14 (×3): 2 [IU] via SUBCUTANEOUS

## 2022-12-13 NOTE — Consult Note (Signed)
   [facility] CM Inpatient Consult   12/13/2022  Patricia [name] 1971-11-03 [id number]   [place]  Accountable Care Organization [ACO] Patient: Medicare ACO REACH  Primary Care Provider:  Scherrie Bateman, with [facility]   Patient is currently active with [facility] for care coordination management services.  Patient has been engaged by a [place].  Our community based plan of care has focused on disease management and community resource support.  Patient's electronic medical record was reviewed for post hospital needs.  Plan:  Continue to follow for progress and needs with Inpatient Transition Of Care [TOC] team member progress notes   Of note, [place] Management services does not replace or interfere with any services that are needed or arranged by inpatient [facility] care management team.   For additional questions or referrals please contact:  Natividad Brood, RN BSN [place]  [phone] business mobile phone Toll free office [phone]  *[place]  [phone] Fax number: [phone] [email or web address]

## 2022-12-13 NOTE — Progress Notes (Signed)
All overdue medications given when she returned to the floor from dialysis. 3L removed.Patient stable and resting. No complaints of pain.

## 2022-12-13 NOTE — Progress Notes (Signed)
PROGRESS NOTE    Patricia [name]  PRF:[id number] DOB: Jul 23, 1971 DOA: 12/10/2022 PCP: Jake Samples, PA-C   Brief Narrative:    Patricia [name] is a 51 y.o. female with medical history significant for morbid obesity, OSA, ESRD on MWF on HD, type 2 diabetes, chronic diastolic heart failure, and ILD with chronic hypoxemia on 2 L nasal cannula who was recently discharged on 12/13 from [facility] after admission for evaluation of chest pain and elevated troponins.  She underwent right and left heart catheterization on 12/12 with no significant findings.  She returns with worsening shortness of breath in the setting of RSV infection as well as volume overload.  Hemodialysis performed 12/26 with repeat session planned 12/27.  Continues to have ongoing improvement in respiratory status today.  Assessment & Plan:   Principal Problem:   Acute on chronic hypoxic respiratory failure (HCC) Active Problems:   Type 2 diabetes mellitus (HCC)   Essential hypertension   Acute on chronic diastolic CHF (congestive heart failure) (HCC)   Elevated troponin   OSA (obstructive sleep apnea)   Obesity, Class III, BMI 40-49.9 (morbid obesity) ([place])   Bronchiolitis obliterans   ESRD needing dialysis ([place])  Assessment and Plan:   Acute on chronic hypoxemia-multifactorial-improving -Currently on BiPAP and wears 2 L nasal cannula at baseline -Continue to wean back to baseline as tolerated, currently on BiPAP   Acute on chronic diastolic CHF exacerbation-improving with HD -Related to volume overload -Recent left and right heart cath 12/12 with normal coronaries and moderate pulmonary hypertension noted -Overall improved after 2 hemodialysis sessions   RSV infection in the setting of ILD -Moderate pulmonary hypertension noted as above on recent cardiac catheterization -DuoNebs scheduled and Pulmicort -IV Solu-Medrol twice daily to be weaned to 40 mg twice daily today -Isolation precautions   Lipase  elevation-resolved -Continue to monitor, but no concern for acute pancreatitis at this time -Noted to have some emesis in the morning, will recheck today   ESRD on HD MWF -Discussed with nephrology with hemodialysis performed 12/27   Chronic anemia of ESRD -Continue to monitor H/H, currently at baseline   Hypertension -Continue home labetalol and minoxidil   Type 2 diabetes with hyperglycemia -Hold home medications and maintain on carb modified diet -SSI with careful coverage while on IV steroids   Dyslipidemia -Continue rosuvastatin   GERD -Continue PPI daily   Morbid obesity/OSA -Maintain on BiPAP as noted above for now -BMI 43.05, lifestyle changes outpatient     DVT prophylaxis: SCDs Code Status: Full Family Communication: Friend at bedside 12/28 Disposition Plan:  Status is: Inpatient Remains inpatient appropriate because: Need for IV medications.   Consultants:  Nephrology   Procedures:  None   Antimicrobials:  Anti-infectives (From admission, onward)    Start     Dose/Rate Route Frequency Ordered Stop   12/10/22 1800  doxycycline (VIBRA-TABS) tablet 100 mg        100 mg Oral 2 times daily 12/10/22 1535          Subjective: Patient seen and evaluated today with ongoing improvement in respiratory status today, but not quite at baseline yet.  Hemodialysis performed yesterday and tolerated well.  Objective: Vitals:   12/13/22 0406 12/13/22 0500 12/13/22 0801 12/13/22 1117  BP: (!) 122/58   (!) 130/59  Pulse: 83   87  Resp: 20     Temp: 97.9 F (36.6 C)     TempSrc: Oral     SpO2: 99%  98%   Weight:  104.9 kg    Height:        Intake/Output Summary (Last 24 hours) at 12/13/2022 1155 Last data filed at 12/12/2022 2256 Gross per 24 hour  Intake 3 ml  Output 3000 ml  Net -2997 ml   Filed Weights   12/12/22 1728 12/12/22 2101 12/13/22 0500  Weight: 107 kg 104 kg 104.9 kg    Examination:  General exam: Appears calm and comfortable,  obese Respiratory system: Minimal bilateral wheezing. Respiratory effort normal.  3 L nasal cannula Cardiovascular system: S1 & S2 heard, RRR.  Gastrointestinal system: Abdomen is soft Central nervous system: Alert and awake Extremities: No edema Skin: No significant lesions noted Psychiatry: Flat affect.    Data Reviewed: I have personally reviewed following labs and imaging studies  CBC: Recent Labs  Lab 12/10/22 1247 12/10/22 2001 12/11/22 0351 12/12/22 0435 12/13/22 0356  WBC 10.5 11.5* 11.7* 9.1 11.2*  HGB 9.2* 8.8* 8.5* 8.4* 8.6*  HCT 30.0* 28.7* 28.8* 28.0* 28.1*  MCV 94.6 94.4 96.6 94.0 92.7  PLT 144* 133* 119* 124* 401   Basic Metabolic Panel: Recent Labs  Lab 12/10/22 1247 12/10/22 2001 12/11/22 0351 12/12/22 0435 12/13/22 0356  NA 135 136 137 137 138  K 4.9 5.7* 5.9* 4.4 4.0  CL 99 100 102 95* 95*  CO2 22 21* 20* 27 30  GLUCOSE 415* 298* 234* 220* 248*  BUN 71* 77* 79* 54* 46*  CREATININE 14.30* 15.64* 15.74* 10.68* 8.10*  CALCIUM 8.4* 8.4* 8.2* 8.4* 8.6*  MG  --   --  1.8 1.6* 1.7  PHOS  --  6.5*  --   --   --    GFR: Estimated Creatinine Clearance: 9.3 mL/min (A) (by C-G formula based on SCr of 8.1 mg/dL (H)). Liver Function Tests: Recent Labs  Lab 12/10/22 1247 12/10/22 2001 12/11/22 0351  AST 30  --  24  ALT 57*  --  48*  ALKPHOS 129*  --  103  BILITOT 0.6  --  0.7  PROT 7.1  --  6.7  ALBUMIN 4.0 3.9 3.7   Recent Labs  Lab 12/10/22 1247 12/12/22 0435  LIPASE 135* 61*   No results for input(s): "AMMONIA" in the last 168 hours. Coagulation Profile: No results for input(s): "INR", "PROTIME" in the last 168 hours. Cardiac Enzymes: No results for input(s): "CKTOTAL", "CKMB", "CKMBINDEX", "TROPONINI" in the last 168 hours. BNP (last 3 results) No results for input(s): "PROBNP" in the last 8760 hours. HbA1C: No results for input(s): "HGBA1C" in the last 72 hours. CBG: Recent Labs  Lab 12/12/22 1111 12/12/22 1612 12/12/22 2101  12/13/22 0720 12/13/22 1120  GLUCAP 387* 259* 196* 248* 361*   Lipid Profile: No results for input(s): "CHOL", "HDL", "LDLCALC", "TRIG", "CHOLHDL", "LDLDIRECT" in the last 72 hours. Thyroid Function Tests: No results for input(s): "TSH", "T4TOTAL", "FREET4", "T3FREE", "THYROIDAB" in the last 72 hours. Anemia Panel: No results for input(s): "VITAMINB12", "FOLATE", "FERRITIN", "TIBC", "IRON", "RETICCTPCT" in the last 72 hours. Sepsis Labs: No results for input(s): "PROCALCITON", "LATICACIDVEN" in the last 168 hours.  Recent Results (from the past 240 hour(s))  Resp panel by RT-PCR (RSV, Flu A&B, Covid) Anterior Nasal Swab     Status: Abnormal   Collection Time: 12/10/22 12:47 PM   Specimen: Anterior Nasal Swab  Result Value Ref Range Status   SARS Coronavirus 2 by RT PCR NEGATIVE NEGATIVE Final    Comment: (NOTE) SARS-CoV-2 target nucleic acids are NOT DETECTED.  The SARS-CoV-2 RNA is generally detectable  in upper respiratory specimens during the acute phase of infection. The lowest concentration of SARS-CoV-2 viral copies this assay can detect is 138 copies/mL. A negative result does not preclude SARS-Cov-2 infection and should not be used as the sole basis for treatment or other patient management decisions. A negative result may occur with  improper specimen collection/handling, submission of specimen other than nasopharyngeal swab, presence of viral mutation(s) within the areas targeted by this assay, and inadequate number of viral copies(<138 copies/mL). A negative result must be combined with clinical observations, patient history, and epidemiological information. The expected result is Negative.  Fact Sheet for Patients:  EntrepreneurPulse.com.au  Fact Sheet for Healthcare Providers:  IncredibleEmployment.be  This test is no t yet approved or cleared by the [place] FDA and  has been authorized for detection and/or diagnosis of  SARS-CoV-2 by FDA under an Emergency Use Authorization (EUA). This EUA will remain  in effect (meaning this test can be used) for the duration of the COVID-19 declaration under Section 564(b)(1) of the Act, 21 U.S.C.section 360bbb-3(b)(1), unless the authorization is terminated  or revoked sooner.       Influenza A by PCR NEGATIVE NEGATIVE Final   Influenza B by PCR NEGATIVE NEGATIVE Final    Comment: (NOTE) The Xpert Xpress SARS-CoV-2/FLU/RSV plus assay is intended as an aid in the diagnosis of influenza from Nasopharyngeal swab specimens and should not be used as a sole basis for treatment. Nasal washings and aspirates are unacceptable for Xpert Xpress SARS-CoV-2/FLU/RSV testing.  Fact Sheet for Patients: EntrepreneurPulse.com.au  Fact Sheet for Healthcare Providers: IncredibleEmployment.be  This test is not yet approved or cleared by the [place] FDA and has been authorized for detection and/or diagnosis of SARS-CoV-2 by FDA under an Emergency Use Authorization (EUA). This EUA will remain in effect (meaning this test can be used) for the duration of the COVID-19 declaration under Section 564(b)(1) of the Act, 21 U.S.C. section 360bbb-3(b)(1), unless the authorization is terminated or revoked.     Resp Syncytial Virus by PCR POSITIVE (A) NEGATIVE Final    Comment: (NOTE) Fact Sheet for Patients: EntrepreneurPulse.com.au  Fact Sheet for Healthcare Providers: IncredibleEmployment.be  This test is not yet approved or cleared by the [place] FDA and has been authorized for detection and/or diagnosis of SARS-CoV-2 by FDA under an Emergency Use Authorization (EUA). This EUA will remain in effect (meaning this test can be used) for the duration of the COVID-19 declaration under Section 564(b)(1) of the Act, 21 U.S.C. section 360bbb-3(b)(1), unless the authorization is terminated  or revoked.  Performed at [facility], [address] [place], [place] [address]          Radiology Studies: No results found.      Scheduled Meds:  arformoterol  15 mcg Nebulization BID   aspirin EC  81 mg Oral Daily   budesonide (PULMICORT) nebulizer solution  0.25 mg Nebulization BID   Chlorhexidine Gluconate Cloth  6 each Topical Q0600   cholecalciferol  5,000 Units Oral Daily   cinacalcet  30 mg Oral Q breakfast   dextromethorphan-guaiFENesin  1 tablet Oral BID   docusate sodium  100 mg Oral Daily   doxycycline  100 mg Oral BID   ferric citrate  420 mg Oral BID WC   insulin aspart  0-15 Units Subcutaneous TID WC   insulin aspart  0-5 Units Subcutaneous QHS   insulin aspart  2 Units Subcutaneous TID WC   [START ON 12/14/2022] insulin detemir  12 Units  Subcutaneous Daily   ipratropium-albuterol  3 mL Nebulization Q6H   labetalol  150 mg Oral BID   linaclotide  290 mcg Oral QAC breakfast   loratadine  10 mg Oral Daily   methylPREDNISolone (SOLU-MEDROL) injection  40 mg Intravenous Q12H   minoxidil  2.5 mg Oral BID   montelukast  10 mg Oral QHS   multivitamin  1 tablet Oral Daily   mouth rinse  15 mL Mouth Rinse 4 times per day   oxyCODONE  5 mg Oral Once   pantoprazole  40 mg Oral BID   rOPINIRole  1 mg Oral BID   rosuvastatin  10 mg Oral Daily   sevelamer carbonate  1,600 mg Oral BID WC   sevelamer carbonate  2,400 mg Oral Q lunch   sodium chloride flush  3 mL Intravenous Q12H   Continuous Infusions:  sodium chloride     anticoagulant sodium citrate       LOS: 3 days    Time spent: 35 minutes    Maclean Foister Darleen Crocker, DO [facility]  If 7PM-7AM, please contact night-coverage [email or web address] 12/13/2022, 11:55 AM

## 2022-12-13 NOTE — Progress Notes (Signed)
Patients blood glucose 410 mg/dL.Patient reported no complaints. MD Manuella Ghazi made aware. New orders placed, give 18 units of insulin. Patient given 18 units, see MAR.

## 2022-12-13 NOTE — Inpatient Diabetes Management (Signed)
Inpatient Diabetes Program Recommendations  AACE/ADA: New Consensus Statement on Inpatient Glycemic Control (2015)  Target Ranges:  Prepandial:   less than 140 mg/dL      Peak postprandial:   less than 180 mg/dL (1-2 hours)      Critically ill patients:  140 - 180 mg/dL   Lab Results  Component Value Date   GLUCAP 248 (H) 12/13/2022   HGBA1C 9.5 (H) 11/19/2022    Review of Glycemic Control  Latest Reference Range & Units 12/12/22 07:40 12/12/22 11:11 12/12/22 16:12 12/12/22 21:01 12/13/22 07:20  Glucose-Capillary 70 - 99 mg/dL 293 (H) 387 (H) 259 (H) 196 (H) 248 (H)  (H): Data is abnormally high  Diabetes history: DM Outpatient Diabetes medications: Glipizide 15 mg BID Current orders for Inpatient glycemic control: Levemir 8 units QD, Novolog 0-15 units TID and 0-5 units QHS, Solumedrol 80 mg Q12H  Inpatient Diabetes Program Recommendations:    Levemir 12 units QD Novolog 2-3 units TID with meals while on steroids  Will continue to follow while inpatient.  Thank you, Reche Dixon, MSN, [place] Diabetes Coordinator Inpatient Diabetes Program [phone] (team pager from 8a-5p)

## 2022-12-13 NOTE — Progress Notes (Signed)
[facility] Progress Note   Assessment/ Plan:   OP HD:  [facility] MWF From 2022  >> 4h 1min 400/600  Hep 1000+ 1800/hr  2/2.5 bath L AVF , EDW 110kg   Assessment/Plan:   Acute on chronic hypoxic respiratory failure - pt with moderate pulmonary HTN by RHC, no CAD by LHC.  Improved with HD and UF of 3.5L.  Way below EDW- will need adjustment at d/c ESRD - normally MWF at [facility]. Last HD Wednesday.  Next HD Friday on normal Schedule.  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  Transfuse prn. ILD - known bronchiolitis obliterans.  On O2 via [place]  Subjective:    Seen in room.  SOB is better.  Way below EDW.     Objective:   BP 122/65   Pulse 87   Temp 98.4 F (36.9 C) (Oral)   Resp 20   Ht 5\' 3"  (1.6 m)   Wt 104.9 kg   SpO2 97%   BMI 40.97 kg/m   Physical Exam: Gen:NAD, sitting in bed CVS: RRR Resp: bilateral occasional wheezing Abd: soft Ext: no LE edema ACCESS: AVF  Labs: BMET Recent Labs  Lab 12/10/22 1247 12/10/22 2001 12/11/22 0351 12/12/22 0435 12/13/22 0356  NA 135 136 137 137 138  K 4.9 5.7* 5.9* 4.4 4.0  CL 99 100 102 95* 95*  CO2 22 21* 20* 27 30  GLUCOSE 415* 298* 234* 220* 248*  BUN 71* 77* 79* 54* 46*  CREATININE 14.30* 15.64* 15.74* 10.68* 8.10*  CALCIUM 8.4* 8.4* 8.2* 8.4* 8.6*  PHOS  --  6.5*  --   --   --    CBC Recent Labs  Lab 12/10/22 2001 12/11/22 0351 12/12/22 0435 12/13/22 0356  WBC 11.5* 11.7* 9.1 11.2*  HGB 8.8* 8.5* 8.4* 8.6*  HCT 28.7* 28.8* 28.0* 28.1*  MCV 94.4 96.6 94.0 92.7  PLT 133* 119* 124* 170      Medications:     arformoterol  15 mcg Nebulization BID   aspirin EC  81 mg Oral Daily   budesonide (PULMICORT) nebulizer solution  0.25 mg Nebulization BID   Chlorhexidine Gluconate Cloth  6 each Topical Q0600   cholecalciferol  5,000 Units Oral Daily   cinacalcet  30 mg Oral Q breakfast   dextromethorphan-guaiFENesin   1 tablet Oral BID   docusate sodium  100 mg Oral Daily   doxycycline  100 mg Oral BID   ferric citrate  420 mg Oral BID WC   insulin aspart  0-15 Units Subcutaneous TID WC   insulin aspart  0-5 Units Subcutaneous QHS   insulin aspart  2 Units Subcutaneous TID WC   [START ON 12/14/2022] insulin detemir  12 Units Subcutaneous Daily   ipratropium-albuterol  3 mL Nebulization Q6H   labetalol  150 mg Oral BID   linaclotide  290 mcg Oral QAC breakfast   loratadine  10 mg Oral Daily   methylPREDNISolone (SOLU-MEDROL) injection  40 mg Intravenous Q12H   minoxidil  2.5 mg Oral BID   montelukast  10 mg Oral QHS   multivitamin  1 tablet Oral Daily   mouth rinse  15 mL Mouth Rinse 4 times per day   oxyCODONE  5 mg Oral Once   pantoprazole  40 mg Oral BID   rOPINIRole  1 mg Oral BID   rosuvastatin  10 mg Oral Daily   sevelamer carbonate  1,600 mg Oral BID WC   sevelamer carbonate  2,400 mg Oral Q lunch   sodium chloride flush  3 mL Intravenous Q12H     Madelon Lips MD 12/13/2022, 5:56 PM

## 2022-12-14 DIAGNOSIS — J9621 Acute and chronic respiratory failure with hypoxia: Secondary | ICD-10-CM | POA: Diagnosis not present

## 2022-12-14 LAB — BASIC METABOLIC PANEL
Anion gap: 16 — ABNORMAL HIGH (ref 5–15)
BUN: 70 mg/dL — ABNORMAL HIGH (ref 6–20)
CO2: 25 mmol/L (ref 22–32)
Calcium: 8.9 mg/dL (ref 8.9–10.3)
Chloride: 97 mmol/L — ABNORMAL LOW (ref 98–111)
Creatinine, Ser: 10.85 mg/dL — ABNORMAL HIGH (ref 0.44–1.00)
GFR, Estimated: 4 mL/min — ABNORMAL LOW (ref >60)
Glucose, Bld: 223 mg/dL — ABNORMAL HIGH (ref 70–99)
Potassium: 4.3 mmol/L (ref 3.5–5.1)
Sodium: 138 mmol/L (ref 135–145)

## 2022-12-14 LAB — GLUCOSE, CAPILLARY
Glucose-Capillary: 219 mg/dL — ABNORMAL HIGH (ref 70–99)
Glucose-Capillary: 307 mg/dL — ABNORMAL HIGH (ref 70–99)
Glucose-Capillary: 160 mg/dL — ABNORMAL HIGH (ref 70–99)
Glucose-Capillary: 148 mg/dL — ABNORMAL HIGH (ref 70–99)

## 2022-12-14 LAB — CBC
HCT: 29.2 % — ABNORMAL LOW (ref 36.0–46.0)
Hemoglobin: 8.9 g/dL — ABNORMAL LOW (ref 12.0–15.0)
MCH: 28.5 pg (ref 26.0–34.0)
MCHC: 30.5 g/dL (ref 30.0–36.0)
MCV: 93.6 fL (ref 80.0–100.0)
Platelets: 203 10*3/uL (ref 150–400)
RBC: 3.12 MIL/uL — ABNORMAL LOW (ref 3.87–5.11)
RDW: 14.8 % (ref 11.5–15.5)
WBC: 11.6 10*3/uL — ABNORMAL HIGH (ref 4.0–10.5)
nRBC: 0 % (ref 0.0–0.2)

## 2022-12-14 LAB — MAGNESIUM: Magnesium: 1.9 mg/dL (ref 1.7–2.4)

## 2022-12-14 MED ORDER — PREDNISONE 10 MG PO TABS: 40.0000 mg | ORAL_TABLET | Freq: Every day | ORAL | 0 refills | Status: AC

## 2022-12-14 NOTE — Discharge Summary (Signed)
Physician Discharge Summary  Patricia [name] TGG:[id number] DOB: Mar 02, 1971 DOA: 12/10/2022  PCP: Jake Samples, PA-C  Admit date: 12/10/2022  Discharge date: 12/14/2022  Admitted From:Home  Disposition:  Home  Recommendations for Outpatient Follow-up:  Follow up with PCP in 1-2 weeks Continue hemodialysis per routine MWF with adjustment to EDW per nephrology Continue on prednisone as prescribed for 5 more days for taper No further need for antibiotics noted Continue home breathing treatments as needed Continue other home medications as prior  Home Health: None  Equipment/Devices: Has home 2 L nasal cannula  Discharge Condition:Stable  CODE STATUS: Full  Diet recommendation: Renal/carb modified  Brief/Interim Summary: Patricia [name] is a 51 y.o. female with medical history significant for morbid obesity, OSA, ESRD on MWF on HD, type 2 diabetes, chronic diastolic heart failure, and ILD with chronic hypoxemia on 2 L nasal cannula who was recently discharged on 12/13 from [facility] after admission for evaluation of chest pain and elevated troponins.  She underwent right and left heart catheterization on 12/12 with no significant findings.  She returns with worsening shortness of breath in the setting of RSV infection as well as volume overload.  Hemodialysis performed multiple times throughout the course of this admission with improvement in volume status and respiratory status.  She is back to her usual baseline with minimal wheezing and is in stable condition for discharge after further hemodialysis today.  No other acute events noted.  Discharge Diagnoses:  Principal Problem:   Acute on chronic hypoxic respiratory failure (HCC) Active Problems:   Type 2 diabetes mellitus (HCC)   Essential hypertension   Acute on chronic diastolic CHF (congestive heart failure) (HCC)   Elevated troponin   OSA (obstructive sleep apnea)   Obesity, Class III, BMI 40-49.9 (morbid obesity) ([place])    Bronchiolitis obliterans   ESRD needing dialysis ([place])  Principal discharge diagnosis: Acute on chronic hypoxemia multifactorial in the setting of volume overload as well as RSV infection in the setting of ILD.  Discharge Instructions  Discharge Instructions     Diet - low sodium heart healthy   Complete by: As directed    Increase activity slowly   Complete by: As directed    No wound care   Complete by: As directed       Allergies as of 12/14/2022       Reactions   Amlodipine Besylate Rash, Other (See Comments)   dizziness   Reglan [metoclopramide] Other (See Comments)   hallucinations        Medication List     STOP taking these medications    azithromycin 250 MG tablet Commonly known as: ZITHROMAX   doxycycline 100 MG tablet Commonly known as: VIBRA-TABS   NIFEdipine 30 MG 24 hr tablet Commonly known as: ADALAT CC       TAKE these medications    acetaminophen 325 MG tablet Commonly known as: TYLENOL Take 650 mg by mouth every 6 (six) hours as needed for mild pain or fever.   albuterol (2.5 MG/3ML) 0.083% nebulizer solution Commonly known as: PROVENTIL Take 3 mLs (2.5 mg total) by nebulization every 6 (six) hours as needed for wheezing or shortness of breath.   albuterol 108 (90 Base) MCG/ACT inhaler Commonly known as: Ventolin HFA Inhale 2 puffs into the lungs every 6 (six) hours as needed for wheezing or shortness of breath.   aspirin EC 81 MG tablet Take 1 tablet (81 mg total) by mouth daily. Swallow whole.   cetirizine 10 MG  tablet Commonly known as: ZYRTEC Take 10 mg by mouth daily.   cinacalcet 60 MG tablet Commonly known as: SENSIPAR Take 30 mg by mouth daily with breakfast.   dicyclomine 10 MG capsule Commonly known as: BENTYL Take 1 capsule (10 mg total) by mouth 2 (two) times daily as needed. What changed: reasons to take this   docusate sodium 100 MG capsule Commonly known as: COLACE Take 100 mg by mouth daily.   ferric  citrate 1 GM 210 MG(Fe) tablet Commonly known as: AURYXIA Take 2 tablets by mouth 2 (two) times daily with a meal. With Breakfast & with supper   glipiZIDE 10 MG tablet Commonly known as: GLUCOTROL Take 15 mg by mouth 2 (two) times daily.   ipratropium 0.03 % nasal spray Commonly known as: ATROVENT Place 2 sprays into both nostrils 2 (two) times daily.   labetalol 100 MG tablet Commonly known as: NORMODYNE Take 1.5 tablets (150 mg total) by mouth 2 (two) times daily. What changed: how much to take   linaclotide 290 MCG Caps capsule Commonly known as: LINZESS Take 290 mcg by mouth daily before breakfast.   minoxidil 2.5 MG tablet Commonly known as: LONITEN Take 1 tablet (2.5 mg total) by mouth 2 (two) times daily.   montelukast 10 MG tablet Commonly known as: SINGULAIR TAKE 1 TABLET BY MOUTH EVERYDAY AT BEDTIME What changed: See the new instructions.   multivitamin Tabs tablet Take 1 tablet by mouth daily.   nitroGLYCERIN 0.4 MG SL tablet Commonly known as: Nitrostat Place 1 tablet (0.4 mg total) under the tongue every 5 (five) minutes as needed for chest pain.   ondansetron 4 MG disintegrating tablet Commonly known as: ZOFRAN-ODT Take 1 tablet (4 mg total) by mouth as needed for nausea or vomiting.   pantoprazole 40 MG tablet Commonly known as: PROTONIX Take 1 tablet (40 mg total) by mouth 2 (two) times daily. 30 minutes before breakfast   predniSONE 10 MG tablet Commonly known as: DELTASONE Take 4 tablets (40 mg total) by mouth daily for 5 days. What changed: See the new instructions.   rOPINIRole 2 MG 24 hr tablet Commonly known as: REQUIP XL Take 2 mg by mouth at bedtime.   rosuvastatin 10 MG tablet Commonly known as: CRESTOR Take 1 tablet (10 mg total) by mouth daily.   sevelamer carbonate 800 MG tablet Commonly known as: RENVELA Take 800 mg by mouth See admin instructions. Take 2 tablets (1600 mg) in the morning, take 3 tablets (2400 mg) with lunch or  snack,  and take 2 tablets (1600 mg) by mouth in the evening with dinner meal.   sodium chloride 0.65 % Soln nasal spray Commonly known as: OCEAN Place 1 spray into both nostrils as needed for congestion.   Symbicort 160-4.5 MCG/ACT inhaler Generic drug: budesonide-formoterol Inhale 2 puffs into the lungs daily.   Vitamin D3 125 MCG (5000 UT) Caps TAKE 1 CAPSULE BY MOUTH EVERY DAY        Follow-up Information     Jake Samples, PA-C. Schedule an appointment as soon as possible for a visit in 1 week(s).   Specialty: Family Medicine Contact information: [place] [address] [phone]                Allergies  Allergen Reactions   Amlodipine Besylate Rash and Other (See Comments)    dizziness   Reglan [Metoclopramide] Other (See Comments)    hallucinations     Consultations: Nephrology  Procedures/Studies: DG Chest Port 1 View  Result Date: 12/10/2022 CLINICAL DATA:  Shortness of breath, cough EXAM: PORTABLE CHEST 1 VIEW COMPARISON:  Previous studies including the examination of 11/26/2022 FINDINGS: Transverse diameter of heart is increased. Central pulmonary vessels are prominent. Increased interstitial markings are seen in parahilar regions and right lower lung field. There is no focal consolidation. Costophrenic angles are clear. There is no pneumothorax. IMPRESSION: Cardiomegaly. Increased interstitial markings are seen in both parahilar regions and right lower lung fields suggesting possible interstitial edema or interstitial pneumonia. There is no focal pulmonary consolidation. Electronically Signed   By: Elmer Picker M.D.   On: 12/10/2022 13:44   CARDIAC CATHETERIZATION  Result Date: 61/95/0932   LV end diastolic pressure is mildly elevated.   Hemodynamic findings consistent with moderate pulmonary hypertension. Normal coronary anatomy Mildly elevated LV filling pressures. LVEDP 18 mm Hg. PCWP 30/28 mean of 22 mm Hg  Moderate pulmonary HTN PAP 76/21 mean 43 mm Hg HIgh cardiac output by Fick due to AV shunt. Plan: medical management.   DG Chest Port 1 View  Result Date: 11/26/2022 CLINICAL DATA:  Chest pain EXAM: PORTABLE CHEST 1 VIEW COMPARISON:  11/18/2022 FINDINGS: Cardiomegaly again noted with vascular congestion. External artifact overlies the chest. Postoperative clips project over the left chest and axilla. No definite superimposed pneumonia, CHF, large effusion or pneumothorax. Trachea midline. IMPRESSION: Cardiomegaly with vascular congestion. Electronically Signed   By: Jerilynn Mages.  Shick M.D.   On: 11/26/2022 10:32   NM Pulmonary Perfusion  Result Date: 11/19/2022 CLINICAL DATA:  Chest pain for 2 days, nonspecific. Shortness of breath for 2 weeks EXAM: NUCLEAR MEDICINE PERFUSION LUNG SCAN TECHNIQUE: Perfusion images were obtained in multiple projections after intravenous injection of radiopharmaceutical. Ventilation scans intentionally deferred if perfusion scan and chest x-ray adequate for interpretation during COVID 19 epidemic. RADIOPHARMACEUTICALS:  4.17 mCi Tc-7m MAA IV COMPARISON:  Chest radiograph 11/18/2022 FINDINGS: There is a uniform distribution of the radiopharmaceutical within both lungs. No peripheral segmental perfusion defects identified bilaterally to suggest an acute pulmonary embolus. IMPRESSION: No evidence for acute pulmonary embolus. Electronically Signed   By: Kerby Moors M.D.   On: 11/19/2022 13:39   ECHOCARDIOGRAM COMPLETE  Result Date: 11/19/2022    ECHOCARDIOGRAM REPORT   Patient Name:   [facility] [place] Date of Exam: 11/19/2022 Medical Rec #:  [id number]   Height:       63.0 in Accession #:    [id number]  Weight:       259.3 lb Date of Birth:  [birth year]  BSA:          2.159 m Patient Age:    14 years    BP:           169/46 mmHg Patient Gender: F           HR:           78 bpm. Exam Location:  [facility] Procedure: 2D Echo, Cardiac Doppler and Color Doppler Indications:    Chest Pain   History:        Patient has prior history of Echocardiogram examinations, most                 recent 06/27/2021. CHF, Signs/Symptoms:Chest Pain, Shortness of                 Breath and Dyspnea; Risk Factors:Hypertension and Diabetes.                 Breast CA.  Sonographer:  Wenda Low Referring Phys: [phone] DAVID TAT  Sonographer Comments: Patient is obese. IMPRESSIONS  1. Left ventricular ejection fraction, by estimation, is 70 to 75%. The left ventricle has hyperdynamic function. The left ventricle has no regional wall motion abnormalities. There is mild left ventricular hypertrophy. Left ventricular diastolic parameters are consistent with Grade I diastolic dysfunction (impaired relaxation). Elevated left atrial pressure.  2. Right ventricular systolic function is normal. The right ventricular size is normal. Tricuspid regurgitation signal is inadequate for assessing PA pressure.  3. The mitral valve is normal in structure. No evidence of mitral valve regurgitation. No evidence of mitral stenosis.  4. The tricuspid valve is abnormal.  5. The aortic valve is tricuspid. Aortic valve regurgitation is not visualized. Mild aortic valve stenosis. Aortic valve mean gradient measures 12.0 mmHg. Aortic valve peak gradient measures 27.1 mmHg. Aortic valve area, by VTI measures 2.38 cm.  6. The inferior vena cava is dilated in size with >50% respiratory variability, suggesting right atrial pressure of 8 mmHg. FINDINGS  Left Ventricle: Left ventricular ejection fraction, by estimation, is 70 to 75%. The left ventricle has hyperdynamic function. The left ventricle has no regional wall motion abnormalities. The left ventricular internal cavity size was normal in size. There is mild left ventricular hypertrophy. Left ventricular diastolic parameters are consistent with Grade I diastolic dysfunction (impaired relaxation). Elevated left atrial pressure. Right Ventricle: The right ventricular size is normal. Right  vetricular wall thickness was not well visualized. Right ventricular systolic function is normal. Tricuspid regurgitation signal is inadequate for assessing PA pressure. Left Atrium: Left atrial size was normal in size. Right Atrium: Right atrial size was normal in size. Pericardium: There is no evidence of pericardial effusion. Mitral Valve: The mitral valve is normal in structure. There is mild thickening of the mitral valve leaflet(s). There is mild calcification of the mitral valve leaflet(s). Mild mitral annular calcification. No evidence of mitral valve regurgitation. No evidence of mitral valve stenosis. MV peak gradient, 9.5 mmHg. The mean mitral valve gradient is 4.0 mmHg. Tricuspid Valve: The tricuspid valve is abnormal. Tricuspid valve regurgitation is mild . No evidence of tricuspid stenosis. Aortic Valve: The aortic valve is tricuspid. Aortic valve regurgitation is not visualized. Mild aortic stenosis is present. Aortic valve mean gradient measures 12.0 mmHg. Aortic valve peak gradient measures 27.1 mmHg. Aortic valve area, by VTI measures 2.38 cm. Pulmonic Valve: The pulmonic valve was not well visualized. Pulmonic valve regurgitation is not visualized. No evidence of pulmonic stenosis. Aorta: The aortic root is normal in size and structure. Venous: The inferior vena cava is dilated in size with greater than 50% respiratory variability, suggesting right atrial pressure of 8 mmHg. IAS/Shunts: No atrial level shunt detected by color flow Doppler.  LEFT VENTRICLE PLAX 2D LVIDd:         4.40 cm   Diastology LVIDs:         2.50 cm   LV e' medial:    6.96 cm/s LV PW:         1.10 cm   LV E/e' medial:  16.5 LV IVS:        1.10 cm   LV e' lateral:   6.74 cm/s LVOT diam:     2.00 cm   LV E/e' lateral: 17.1 LV SV:         116 LV SV Index:   54 LVOT Area:     3.14 cm  RIGHT VENTRICLE RV Basal diam:  4.05 cm RV Mid diam:  3.30 cm RV S prime:     16.80 cm/s TAPSE (M-mode): 3.1 cm LEFT ATRIUM              Index         RIGHT ATRIUM           Index LA diam:        4.70 cm  2.18 cm/m   RA Area:     23.50 cm LA Vol (A2C):   103.0 ml 47.70 ml/m  RA Volume:   72.80 ml  33.72 ml/m LA Vol (A4C):   71.4 ml  33.07 ml/m LA Biplane Vol: 87.1 ml  40.34 ml/m  AORTIC VALVE AV Area (Vmax):    1.95 cm AV Area (Vmean):   1.88 cm AV Area (VTI):     2.38 cm AV Vmax:           260.50 cm/s AV Vmean:          158.500 cm/s AV VTI:            0.489 m AV Peak Grad:      27.1 mmHg AV Mean Grad:      12.0 mmHg LVOT Vmax:         162.00 cm/s LVOT Vmean:        95.100 cm/s LVOT VTI:          0.370 m LVOT/AV VTI ratio: 0.76  AORTA Ao Root diam: 3.00 cm MITRAL VALVE MV Area (PHT): 2.81 cm     SHUNTS MV Area VTI:   2.79 cm     Systemic VTI:  0.37 m MV Peak grad:  9.5 mmHg     Systemic Diam: 2.00 cm MV Mean grad:  4.0 mmHg MV Vmax:       1.54 m/s MV Vmean:      97.9 cm/s MV Decel Time: 270 msec MV E velocity: 115.00 cm/s MV A velocity: 142.00 cm/s MV E/A ratio:  0.81 Carlyle Dolly MD Electronically signed by Carlyle Dolly MD Signature Date/Time: 11/19/2022/12:03:01 PM    Final    US Abdomen Limited RUQ (LIVER/GB)  Result Date: 11/19/2022 CLINICAL DATA:  Elevated LFTs EXAM: ULTRASOUND ABDOMEN LIMITED RIGHT UPPER QUADRANT COMPARISON:  No prior abdomen ultrasound available, correlation is made with CT abdomen pelvis 07/01/2021 FINDINGS: Gallbladder: Status post cholecystectomy. Common bile duct: Diameter: 5 mm, within normal limits. No intrahepatic biliary ductal dilatation. Liver: No focal lesion identified. Increased parenchymal echogenicity. Portal vein is patent on color Doppler imaging with normal direction of blood flow towards the liver, although the portal vein and hepatic vein appears somewhat prominent. Other: None. IMPRESSION: 1. Hepatic steatosis. 2. Portal vein and hepatic vein appear somewhat prominent, although there is normal direction of blood flow toward the liver in the portal vein. Electronically Signed   By: Merilyn Baba  M.D.   On: 11/19/2022 11:14   DG Chest 2 View  Result Date: 11/18/2022 CLINICAL DATA:  Shortness of breath EXAM: CHEST - 2 VIEW COMPARISON:  10/29/2022 FINDINGS: Moderate cardiomegaly with pulmonary vascular congestion. Diffuse bilateral interstitial and alveolar opacities. No large pleural fluid collection. No pneumothorax. IMPRESSION: Appearance suggests CHF with pulmonary edema. Electronically Signed   By: Davina Poke D.O.   On: 11/18/2022 13:48     Discharge Exam: Vitals:   12/14/22 0726 12/14/22 0727  BP:    Pulse:    Resp:    Temp:    SpO2: 96% 96%   Vitals:   12/14/22 0327 12/14/22 0725 12/14/22 0726  12/14/22 0727  BP: 133/65     Pulse: 80     Resp: 17     Temp: 98.3 F (36.8 C)     TempSrc:      SpO2: 99% 96% 96% 96%  Weight: 106.4 kg     Height: 5\' 3"  (1.6 m)       General: Pt is alert, awake, not in acute distress, obese Cardiovascular: RRR, S1/S2 +, no rubs, no gallops Respiratory: CTA bilaterally, no wheezing, no rhonchi, 2 L nasal cannula Abdominal: Soft, NT, ND, bowel sounds + Extremities: no edema, no cyanosis    The results of significant diagnostics from this hospitalization (including imaging, microbiology, ancillary and laboratory) are listed below for reference.     Microbiology: Recent Results (from the past 240 hour(s))  Resp panel by RT-PCR (RSV, Flu A&B, Covid) Anterior Nasal Swab     Status: Abnormal   Collection Time: 12/10/22 12:47 PM   Specimen: Anterior Nasal Swab  Result Value Ref Range Status   SARS Coronavirus 2 by RT PCR NEGATIVE NEGATIVE Final    Comment: (NOTE) SARS-CoV-2 target nucleic acids are NOT DETECTED.  The SARS-CoV-2 RNA is generally detectable in upper respiratory specimens during the acute phase of infection. The lowest concentration of SARS-CoV-2 viral copies this assay can detect is 138 copies/mL. A negative result does not preclude SARS-Cov-2 infection and should not be used as the sole basis for treatment  or other patient management decisions. A negative result may occur with  improper specimen collection/handling, submission of specimen other than nasopharyngeal swab, presence of viral mutation(s) within the areas targeted by this assay, and inadequate number of viral copies(<138 copies/mL). A negative result must be combined with clinical observations, patient history, and epidemiological information. The expected result is Negative.  Fact Sheet for Patients:  EntrepreneurPulse.com.au  Fact Sheet for Healthcare Providers:  IncredibleEmployment.be  This test is no t yet approved or cleared by the [place] FDA and  has been authorized for detection and/or diagnosis of SARS-CoV-2 by FDA under an Emergency Use Authorization (EUA). This EUA will remain  in effect (meaning this test can be used) for the duration of the COVID-19 declaration under Section 564(b)(1) of the Act, 21 U.S.C.section 360bbb-3(b)(1), unless the authorization is terminated  or revoked sooner.       Influenza A by PCR NEGATIVE NEGATIVE Final   Influenza B by PCR NEGATIVE NEGATIVE Final    Comment: (NOTE) The Xpert Xpress SARS-CoV-2/FLU/RSV plus assay is intended as an aid in the diagnosis of influenza from Nasopharyngeal swab specimens and should not be used as a sole basis for treatment. Nasal washings and aspirates are unacceptable for Xpert Xpress SARS-CoV-2/FLU/RSV testing.  Fact Sheet for Patients: EntrepreneurPulse.com.au  Fact Sheet for Healthcare Providers: IncredibleEmployment.be  This test is not yet approved or cleared by the [place] FDA and has been authorized for detection and/or diagnosis of SARS-CoV-2 by FDA under an Emergency Use Authorization (EUA). This EUA will remain in effect (meaning this test can be used) for the duration of the COVID-19 declaration under Section 564(b)(1) of the Act, 21 U.S.C. section  360bbb-3(b)(1), unless the authorization is terminated or revoked.     Resp Syncytial Virus by PCR POSITIVE (A) NEGATIVE Final    Comment: (NOTE) Fact Sheet for Patients: EntrepreneurPulse.com.au  Fact Sheet for Healthcare Providers: IncredibleEmployment.be  This test is not yet approved or cleared by the [place] FDA and has been authorized for detection and/or diagnosis of SARS-CoV-2 by FDA under  an Emergency Use Authorization (EUA). This EUA will remain in effect (meaning this test can be used) for the duration of the COVID-19 declaration under Section 564(b)(1) of the Act, 21 U.S.C. section 360bbb-3(b)(1), unless the authorization is terminated or revoked.  Performed at [facility], [address] [place], [place] [address]      Labs: BNP (last 3 results) Recent Labs    11/19/22 1408  BNP 650.3*   Basic Metabolic Panel: Recent Labs  Lab 12/10/22 2001 12/11/22 0351 12/12/22 0435 12/13/22 0356 12/14/22 0432  NA 136 137 137 138 138  K 5.7* 5.9* 4.4 4.0 4.3  CL 100 102 95* 95* 97*  CO2 21* 20* 27 30 25   GLUCOSE 298* 234* 220* 248* 223*  BUN 77* 79* 54* 46* 70*  CREATININE 15.64* 15.74* 10.68* 8.10* 10.85*  CALCIUM 8.4* 8.2* 8.4* 8.6* 8.9  MG  --  1.8 1.6* 1.7 1.9  PHOS 6.5*  --   --   --   --    Liver Function Tests: Recent Labs  Lab 12/10/22 1247 12/10/22 2001 12/11/22 0351  AST 30  --  24  ALT 57*  --  48*  ALKPHOS 129*  --  103  BILITOT 0.6  --  0.7  PROT 7.1  --  6.7  ALBUMIN 4.0 3.9 3.7   Recent Labs  Lab 12/10/22 1247 12/12/22 0435  LIPASE 135* 61*   No results for input(s): "AMMONIA" in the last 168 hours. CBC: Recent Labs  Lab 12/10/22 2001 12/11/22 0351 12/12/22 0435 12/13/22 0356 12/14/22 0432  WBC 11.5* 11.7* 9.1 11.2* 11.6*  HGB 8.8* 8.5* 8.4* 8.6* 8.9*  HCT 28.7* 28.8* 28.0* 28.1* 29.2*  MCV 94.4 96.6 94.0 92.7 93.6  PLT 133* 119* 124* 170 203   Cardiac Enzymes: No results for  input(s): "CKTOTAL", "CKMB", "CKMBINDEX", "TROPONINI" in the last 168 hours. BNP: Invalid input(s): "POCBNP" CBG: Recent Labs  Lab 12/13/22 0720 12/13/22 1120 12/13/22 1656 12/13/22 2017 12/14/22 0727  GLUCAP 248* 361* 410* 291* 219*   D-Dimer No results for input(s): "DDIMER" in the last 72 hours. Hgb A1c No results for input(s): "HGBA1C" in the last 72 hours. Lipid Profile No results for input(s): "CHOL", "HDL", "LDLCALC", "TRIG", "CHOLHDL", "LDLDIRECT" in the last 72 hours. Thyroid function studies No results for input(s): "TSH", "T4TOTAL", "T3FREE", "THYROIDAB" in the last 72 hours.  Invalid input(s): "FREET3" Anemia work up No results for input(s): "VITAMINB12", "FOLATE", "FERRITIN", "TIBC", "IRON", "RETICCTPCT" in the last 72 hours. Urinalysis    Component Value Date/Time   COLORURINE STRAW (A) 12/22/2018 0847   APPEARANCEUR CLEAR 12/22/2018 0847   LABSPEC 1.007 12/22/2018 0847   PHURINE 9.0 (H) 12/22/2018 0847   GLUCOSEU >=500 (A) 12/22/2018 0847   HGBUR NEGATIVE 12/22/2018 0847   BILIRUBINUR NEGATIVE 12/22/2018 0847   KETONESUR NEGATIVE 12/22/2018 0847   PROTEINUR 100 (A) 12/22/2018 0847   NITRITE NEGATIVE 12/22/2018 0847   LEUKOCYTESUR NEGATIVE 12/22/2018 0847   Sepsis Labs Recent Labs  Lab 12/11/22 0351 12/12/22 0435 12/13/22 0356 12/14/22 0432  WBC 11.7* 9.1 11.2* 11.6*   Microbiology Recent Results (from the past 240 hour(s))  Resp panel by RT-PCR (RSV, Flu A&B, Covid) Anterior Nasal Swab     Status: Abnormal   Collection Time: 12/10/22 12:47 PM   Specimen: Anterior Nasal Swab  Result Value Ref Range Status   SARS Coronavirus 2 by RT PCR NEGATIVE NEGATIVE Final    Comment: (NOTE) SARS-CoV-2 target nucleic acids are NOT DETECTED.  The SARS-CoV-2 RNA  is generally detectable in upper respiratory specimens during the acute phase of infection. The lowest concentration of SARS-CoV-2 viral copies this assay can detect is 138 copies/mL. A negative  result does not preclude SARS-Cov-2 infection and should not be used as the sole basis for treatment or other patient management decisions. A negative result may occur with  improper specimen collection/handling, submission of specimen other than nasopharyngeal swab, presence of viral mutation(s) within the areas targeted by this assay, and inadequate number of viral copies(<138 copies/mL). A negative result must be combined with clinical observations, patient history, and epidemiological information. The expected result is Negative.  Fact Sheet for Patients:  EntrepreneurPulse.com.au  Fact Sheet for Healthcare Providers:  IncredibleEmployment.be  This test is no t yet approved or cleared by the [place] FDA and  has been authorized for detection and/or diagnosis of SARS-CoV-2 by FDA under an Emergency Use Authorization (EUA). This EUA will remain  in effect (meaning this test can be used) for the duration of the COVID-19 declaration under Section 564(b)(1) of the Act, 21 U.S.C.section 360bbb-3(b)(1), unless the authorization is terminated  or revoked sooner.       Influenza A by PCR NEGATIVE NEGATIVE Final   Influenza B by PCR NEGATIVE NEGATIVE Final    Comment: (NOTE) The Xpert Xpress SARS-CoV-2/FLU/RSV plus assay is intended as an aid in the diagnosis of influenza from Nasopharyngeal swab specimens and should not be used as a sole basis for treatment. Nasal washings and aspirates are unacceptable for Xpert Xpress SARS-CoV-2/FLU/RSV testing.  Fact Sheet for Patients: EntrepreneurPulse.com.au  Fact Sheet for Healthcare Providers: IncredibleEmployment.be  This test is not yet approved or cleared by the [place] FDA and has been authorized for detection and/or diagnosis of SARS-CoV-2 by FDA under an Emergency Use Authorization (EUA). This EUA will remain in effect (meaning this test can be used)  for the duration of the COVID-19 declaration under Section 564(b)(1) of the Act, 21 U.S.C. section 360bbb-3(b)(1), unless the authorization is terminated or revoked.     Resp Syncytial Virus by PCR POSITIVE (A) NEGATIVE Final    Comment: (NOTE) Fact Sheet for Patients: EntrepreneurPulse.com.au  Fact Sheet for Healthcare Providers: IncredibleEmployment.be  This test is not yet approved or cleared by the [place] FDA and has been authorized for detection and/or diagnosis of SARS-CoV-2 by FDA under an Emergency Use Authorization (EUA). This EUA will remain in effect (meaning this test can be used) for the duration of the COVID-19 declaration under Section 564(b)(1) of the Act, 21 U.S.C. section 360bbb-3(b)(1), unless the authorization is terminated or revoked.  Performed at [facility], [address] [place], [place] [address]      Time coordinating discharge: 35 minutes  SIGNED:   Rodena Goldmann, DO [facility] 12/14/2022, 10:28 AM  If 7PM-7AM, please contact night-coverage [email or web address]

## 2022-12-14 NOTE — Procedures (Signed)
   HEMODIALYSIS TREATMENT NOTE:   Uneventful 3.5 hour heparin-free treatment completed using left upper arm AVF (15g/antegrade). Goal met: 2.5 liters removed without interruption in UF.  All blood was returned and hemostasis was achieved in 20 minutes.  Post weight 103.1kg (standing) - will report this value to [employer] tomorrow.  Pt was transported back to 325 and hand-off was given to Tad Moore, Therapist, sports.   Rockwell Alexandria, RN [facility]

## 2022-12-14 NOTE — Progress Notes (Signed)
[facility] Progress Note   Assessment/ Plan:   OP HD:  [facility]  MWF From 2022  >> 4h 22min 400/600  Hep 1000+ 1800/hr  2/2.5 bath L AVF , EDW 110kg   Assessment/Plan:   Acute on chronic hypoxic respiratory failure - pt with moderate pulmonary HTN by RHC, no CAD by LHC.  Improved with HD and UF of 3.5L.  Way below EDW- will need adjustment at d/c.  Likely 104.9 kg but maybe could go even lower depending on post- weight today. ESRD - normally MWF at [facility] Last HD Wednesday.  Next HD Friday (today) on normal Schedule.  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  Transfuse prn. ILD - known bronchiolitis obliterans.  On O2 via [place]  Subjective:    Seen in room.  For d/c today after HD.     Objective:   BP 133/65 (BP Location: Right Arm)   Pulse 80   Temp 98.3 F (36.8 C)   Resp 17   Ht 5\' 3"  (1.6 m)   Wt 106.4 kg   SpO2 96%   BMI 41.55 kg/m   Physical Exam: Gen:NAD, sitting in bed CVS: RRR Resp: bilateral occasional wheezing Abd: soft Ext: no LE edema ACCESS: L AVF  Labs: BMET Recent Labs  Lab 12/10/22 1247 12/10/22 2001 12/11/22 0351 12/12/22 0435 12/13/22 0356 12/14/22 0432  NA 135 136 137 137 138 138  K 4.9 5.7* 5.9* 4.4 4.0 4.3  CL 99 100 102 95* 95* 97*  CO2 22 21* 20* 27 30 25   GLUCOSE 415* 298* 234* 220* 248* 223*  BUN 71* 77* 79* 54* 46* 70*  CREATININE 14.30* 15.64* 15.74* 10.68* 8.10* 10.85*  CALCIUM 8.4* 8.4* 8.2* 8.4* 8.6* 8.9  PHOS  --  6.5*  --   --   --   --    CBC Recent Labs  Lab 12/11/22 0351 12/12/22 0435 12/13/22 0356 12/14/22 0432  WBC 11.7* 9.1 11.2* 11.6*  HGB 8.5* 8.4* 8.6* 8.9*  HCT 28.8* 28.0* 28.1* 29.2*  MCV 96.6 94.0 92.7 93.6  PLT 119* 124* 170 203      Medications:     arformoterol  15 mcg Nebulization BID   aspirin EC  81 mg Oral Daily   budesonide (PULMICORT) nebulizer solution  0.25 mg Nebulization BID    Chlorhexidine Gluconate Cloth  6 each Topical Q0600   cholecalciferol  5,000 Units Oral Daily   cinacalcet  30 mg Oral Q breakfast   dextromethorphan-guaiFENesin  1 tablet Oral BID   docusate sodium  100 mg Oral Daily   doxycycline  100 mg Oral BID   ferric citrate  420 mg Oral BID WC   insulin aspart  0-15 Units Subcutaneous TID WC   insulin aspart  0-5 Units Subcutaneous QHS   insulin aspart  2 Units Subcutaneous TID WC   insulin detemir  12 Units Subcutaneous Daily   ipratropium-albuterol  3 mL Nebulization Q6H   labetalol  150 mg Oral BID   linaclotide  290 mcg Oral QAC breakfast   loratadine  10 mg Oral Daily   methylPREDNISolone (SOLU-MEDROL) injection  40 mg Intravenous Q12H   minoxidil  2.5 mg Oral BID   montelukast  10 mg Oral QHS   multivitamin  1 tablet Oral Daily   mouth rinse  15 mL Mouth Rinse 4 times per day   oxyCODONE  5 mg Oral Once  pantoprazole  40 mg Oral BID   rOPINIRole  1 mg Oral BID   rosuvastatin  10 mg Oral Daily   sevelamer carbonate  1,600 mg Oral BID WC   sevelamer carbonate  2,400 mg Oral Q lunch   sodium chloride flush  3 mL Intravenous Q12H     Madelon Lips MD 12/14/2022, 10:36 AM

## 2022-12-14 NOTE — Care Management Important Message (Signed)
Important Message  Patient Details  Name: Patricia [name] MRN: [id number] Date of Birth: 1971-05-26   Medicare Important Message Given:  Other (see comment) (spoke with by phone at [phone] to review letter, no additonal copy needed)     Tommy Medal 12/14/2022, 12:07 PM

## 2022-12-14 NOTE — Progress Notes (Signed)
Patient reported dizziness while getting out of bed at times. MD Manuella Ghazi made aware. No new orders.

## 2022-12-16 DIAGNOSIS — N186 End stage renal disease: Secondary | ICD-10-CM | POA: Diagnosis not present

## 2022-12-16 DIAGNOSIS — Z992 Dependence on renal dialysis: Secondary | ICD-10-CM | POA: Diagnosis not present

## 2022-12-17 DIAGNOSIS — D509 Iron deficiency anemia, unspecified: Secondary | ICD-10-CM | POA: Diagnosis not present

## 2022-12-17 DIAGNOSIS — Z992 Dependence on renal dialysis: Secondary | ICD-10-CM | POA: Diagnosis not present

## 2022-12-17 DIAGNOSIS — N250 Renal osteodystrophy: Secondary | ICD-10-CM | POA: Diagnosis not present

## 2022-12-17 DIAGNOSIS — D631 Anemia in chronic kidney disease: Secondary | ICD-10-CM | POA: Diagnosis not present

## 2022-12-17 DIAGNOSIS — N186 End stage renal disease: Secondary | ICD-10-CM | POA: Diagnosis not present

## 2022-12-17 DIAGNOSIS — N2581 Secondary hyperparathyroidism of renal origin: Secondary | ICD-10-CM | POA: Diagnosis not present

## 2022-12-18 ENCOUNTER — Encounter: Payer: Self-pay | Admitting: *Deleted

## 2022-12-18 ENCOUNTER — Ambulatory Visit: Payer: Self-pay | Admitting: *Deleted

## 2022-12-18 ENCOUNTER — Telehealth: Payer: Self-pay | Admitting: *Deleted

## 2022-12-18 ENCOUNTER — Telehealth: Payer: Self-pay | Admitting: "Endocrinology

## 2022-12-18 DIAGNOSIS — N2581 Secondary hyperparathyroidism of renal origin: Secondary | ICD-10-CM | POA: Diagnosis not present

## 2022-12-18 DIAGNOSIS — D509 Iron deficiency anemia, unspecified: Secondary | ICD-10-CM | POA: Diagnosis not present

## 2022-12-18 DIAGNOSIS — Z992 Dependence on renal dialysis: Secondary | ICD-10-CM | POA: Diagnosis not present

## 2022-12-18 DIAGNOSIS — D631 Anemia in chronic kidney disease: Secondary | ICD-10-CM | POA: Diagnosis not present

## 2022-12-18 DIAGNOSIS — N250 Renal osteodystrophy: Secondary | ICD-10-CM | POA: Diagnosis not present

## 2022-12-18 DIAGNOSIS — N186 End stage renal disease: Secondary | ICD-10-CM | POA: Diagnosis not present

## 2022-12-18 NOTE — Telephone Encounter (Signed)
She had RSV infection with CHF.  Her chest xray did show some changes of pneumonia.  Okay for her to take doxycycline script she has from before.

## 2022-12-18 NOTE — Telephone Encounter (Signed)
-----   Message from Patricia Anger, MD sent at 12/18/2022  4:26 PM EST ----- Regarding: RE: Diabetes Management Patricia [name], Thank you for reaching out on our mutual patient. We will take it from here. Patricia [name], see if she can be seen in 1-2 weeks with her meter. GN ----- Message ----- From: Patricia China, RN Sent: 12/18/2022  12:21 PM EST To: Patricia Anger, MD Subject: Diabetes Management                            Patient is seeing you for thyroid management, and missed her last appt due to hospitalization. She has been hospitalized twice recently for pulmonary complications and has been on prednisone which is causing hyperglycemia. She's on glipizide but may need to be managed differently if she's going to be on prednisone frequently. Discharging physician recommended that she follow-up with you regarding diabetes management.   Can you have one of your staff reach out to schedule an appt? She plans to go to [facility] this week to have the labs you ordered in June. It looks like the order is still active.   Please let me know if I can be of any assistance.   Thank you,  Patricia [name], BSN, RN-BC [place]: [phone] Main #: [phone]

## 2022-12-18 NOTE — Telephone Encounter (Signed)
Called pt , left VM

## 2022-12-18 NOTE — Telephone Encounter (Signed)
Called patient back and read message from dr. Halford Chessman. She voiced understanding and states she will take rest of doxy and will call back if she needs [place] before appt next week. Nothing further needed at this time.

## 2022-12-18 NOTE — Patient Outreach (Signed)
  Care Coordination   Follow Up Visit Note   12/18/2022 Name: Patricia [name] MRN: [id number] DOB: May 03, 1971  Patricia [name] is a 52 y.o. year old female who sees Jake Samples, [place] for primary care. I spoke with  Patricia [name] by phone today.  What matters to the patients health and wellness today?  Manage pulmonary conditions and blood sugar    Goals Addressed             This Visit's Progress    Care Coordination Services       Care Coordination Interventions: Evaluation of current treatment plan related to Discharge from [facility] on 12/14/22 for acute pulmonary edema and patient's adherence to plan as established by provider Provided education to patient re: discharge instructions. Continue home O2 of 2L, dialysis 3 days per week (M,W,F), f/u with Dr Dorris Fetch, PCP, and Dr Halford Chessman Reviewed medications with patient and discussed access and affordability. Prescribed prednisone taper. Talked with Dr Juanetta Gosling office and has resumed doxycycline b/c xray showed possible pneumonia Collaborated with Dr Dorris Fetch regarding need for blood sugar management due to hyperglycemia from frequent prednisone use to treat pulmonary conditions. Needs f/u appt. His office to reach out and schedule.  Reviewed scheduled/upcoming provider appointments including Dr Halford Chessman (pulmonary) 12/27/22 at 8:45. Verified transportation.  Discussed plans with patient for ongoing care management follow up and provided patient with direct contact information for care management team Assessed social determinant of health barriers Discussed need for PCP follow-up in 1-2 weeks. Patient to call and schedule.  advised to call pulmonologist or PCP or seek emergency medical attention if needed for any new or worsening symptoms Reviewed and discussed medications, access, and affordability.  Taking glipizide for blood sugar but poorly controlled due to prednisone Discussed recommended diet Provided with [facility] telephone number and encouraged to  reach out as needed [facility] telephone f/u appt scheduled for 01/03/23         SDOH assessments and interventions completed:  Yes   SDOH Interventions Today    Flowsheet Row Most Recent Value  SDOH Interventions   Food Insecurity Interventions Intervention Not Indicated  Transportation Interventions Intervention Not Indicated  Financial Strain Interventions Intervention Not Indicated      Care Coordination Interventions:  Yes, provided   Follow up plan: Follow up call scheduled for 01/03/23    Encounter Outcome:  Pt. Visit Completed   Chong Sicilian, BSN, RN-BC RN Care Coordinator [place]: [phone] Main #: [phone]

## 2022-12-18 NOTE — Telephone Encounter (Signed)
Patient was admitted to [facility] 12/25-12/29.  Patient states she did not have any antibiotics to take after D/C but was prescribed prednisone taper and states she still has chest congestion and wants to know if she can have an antibiotic to take. Patient still has doxycycline (from sick call on 12/07/22) and has a prednisone taper from hospital. She wants to know if she should continue doxy (she has 5 days left) or can have another antibiotic sent in to [facility]. She has an upcoming ov on 12/27/22.  Dr. Halford Chessman please advise.

## 2022-12-18 NOTE — Telephone Encounter (Signed)
ATC patient.  LMTCB. 

## 2022-12-19 DIAGNOSIS — D509 Iron deficiency anemia, unspecified: Secondary | ICD-10-CM | POA: Diagnosis not present

## 2022-12-19 DIAGNOSIS — N250 Renal osteodystrophy: Secondary | ICD-10-CM | POA: Diagnosis not present

## 2022-12-19 DIAGNOSIS — Z992 Dependence on renal dialysis: Secondary | ICD-10-CM | POA: Diagnosis not present

## 2022-12-19 DIAGNOSIS — N2581 Secondary hyperparathyroidism of renal origin: Secondary | ICD-10-CM | POA: Diagnosis not present

## 2022-12-19 DIAGNOSIS — D631 Anemia in chronic kidney disease: Secondary | ICD-10-CM | POA: Diagnosis not present

## 2022-12-19 DIAGNOSIS — N186 End stage renal disease: Secondary | ICD-10-CM | POA: Diagnosis not present

## 2022-12-21 DIAGNOSIS — N186 End stage renal disease: Secondary | ICD-10-CM | POA: Diagnosis not present

## 2022-12-21 DIAGNOSIS — N185 Chronic kidney disease, stage 5: Secondary | ICD-10-CM | POA: Diagnosis not present

## 2022-12-21 DIAGNOSIS — R0902 Hypoxemia: Secondary | ICD-10-CM | POA: Diagnosis not present

## 2022-12-21 DIAGNOSIS — Z992 Dependence on renal dialysis: Secondary | ICD-10-CM | POA: Diagnosis not present

## 2022-12-21 DIAGNOSIS — E1165 Type 2 diabetes mellitus with hyperglycemia: Secondary | ICD-10-CM | POA: Diagnosis not present

## 2022-12-21 DIAGNOSIS — N250 Renal osteodystrophy: Secondary | ICD-10-CM | POA: Diagnosis not present

## 2022-12-21 DIAGNOSIS — Z6841 Body Mass Index (BMI) 40.0 and over, adult: Secondary | ICD-10-CM | POA: Diagnosis not present

## 2022-12-21 DIAGNOSIS — N2581 Secondary hyperparathyroidism of renal origin: Secondary | ICD-10-CM | POA: Diagnosis not present

## 2022-12-21 DIAGNOSIS — D631 Anemia in chronic kidney disease: Secondary | ICD-10-CM | POA: Diagnosis not present

## 2022-12-21 DIAGNOSIS — I10 Essential (primary) hypertension: Secondary | ICD-10-CM | POA: Diagnosis not present

## 2022-12-21 DIAGNOSIS — D509 Iron deficiency anemia, unspecified: Secondary | ICD-10-CM | POA: Diagnosis not present

## 2022-12-21 DIAGNOSIS — E1129 Type 2 diabetes mellitus with other diabetic kidney complication: Secondary | ICD-10-CM | POA: Diagnosis not present

## 2022-12-21 DIAGNOSIS — Z9981 Dependence on supplemental oxygen: Secondary | ICD-10-CM | POA: Diagnosis not present

## 2022-12-24 DIAGNOSIS — N250 Renal osteodystrophy: Secondary | ICD-10-CM | POA: Diagnosis not present

## 2022-12-24 DIAGNOSIS — N2581 Secondary hyperparathyroidism of renal origin: Secondary | ICD-10-CM | POA: Diagnosis not present

## 2022-12-24 DIAGNOSIS — Z992 Dependence on renal dialysis: Secondary | ICD-10-CM | POA: Diagnosis not present

## 2022-12-24 DIAGNOSIS — D631 Anemia in chronic kidney disease: Secondary | ICD-10-CM | POA: Diagnosis not present

## 2022-12-24 DIAGNOSIS — D509 Iron deficiency anemia, unspecified: Secondary | ICD-10-CM | POA: Diagnosis not present

## 2022-12-24 DIAGNOSIS — N186 End stage renal disease: Secondary | ICD-10-CM | POA: Diagnosis not present

## 2022-12-26 DIAGNOSIS — N186 End stage renal disease: Secondary | ICD-10-CM | POA: Diagnosis not present

## 2022-12-26 DIAGNOSIS — N2581 Secondary hyperparathyroidism of renal origin: Secondary | ICD-10-CM | POA: Diagnosis not present

## 2022-12-26 DIAGNOSIS — N250 Renal osteodystrophy: Secondary | ICD-10-CM | POA: Diagnosis not present

## 2022-12-26 DIAGNOSIS — D631 Anemia in chronic kidney disease: Secondary | ICD-10-CM | POA: Diagnosis not present

## 2022-12-26 DIAGNOSIS — Z992 Dependence on renal dialysis: Secondary | ICD-10-CM | POA: Diagnosis not present

## 2022-12-26 DIAGNOSIS — D509 Iron deficiency anemia, unspecified: Secondary | ICD-10-CM | POA: Diagnosis not present

## 2022-12-27 ENCOUNTER — Ambulatory Visit (INDEPENDENT_AMBULATORY_CARE_PROVIDER_SITE_OTHER): Payer: Medicare Other | Admitting: Pulmonary Disease

## 2022-12-27 ENCOUNTER — Encounter: Payer: Self-pay | Admitting: Pulmonary Disease

## 2022-12-27 VITALS — BP 130/82 | HR 93 | Ht 63.0 in | Wt 239.0 lb

## 2022-12-27 DIAGNOSIS — J9611 Chronic respiratory failure with hypoxia: Secondary | ICD-10-CM

## 2022-12-27 DIAGNOSIS — J849 Interstitial pulmonary disease, unspecified: Secondary | ICD-10-CM | POA: Diagnosis not present

## 2022-12-27 DIAGNOSIS — G4733 Obstructive sleep apnea (adult) (pediatric): Secondary | ICD-10-CM | POA: Diagnosis not present

## 2022-12-27 DIAGNOSIS — J4481 Bronchiolitis obliterans and bronchiolitis obliterans syndrome: Secondary | ICD-10-CM | POA: Diagnosis not present

## 2022-12-27 NOTE — Progress Notes (Signed)
[facility]  Chief Complaint  Patient presents with   Follow-up    Pt f/u, 3 hospital visits since LOV, she reports still having increased SOB, chest congestion, dry cough. PCP prescribed cough syrup. She has finished all steroids and antibiotics prescribed from [facility].    Constitutional:  BP 130/82   Pulse 93   Ht 5\' 3"  (1.6 m)   Wt 239 lb (108.4 kg)   SpO2 92% Comment: 2L  BMI 42.34 kg/m   Past Medical History:  Anemia, OA, Breast cancer, DM, ESRD on iHD, GERD, HTN, COVID pneumonia January 2021  Past Surgical History:  She  has a past surgical history that includes Cholecystectomy; Cesarean section; Dilation and curettage of uterus; Abdominal hysterectomy; AV fistula placement (11/2014); Esophagogastroduodenoscopy (N/A, 07/10/2016); Balloon dilation (N/A, 07/10/2016); Colonoscopy with propofol (N/A, 09/27/2016); Mastectomy; Breast lumpectomy; ORIF ankle fracture (Right, 10/06/2018); External fixation removal (Right, 10/29/2018); AV fistula repair (N/A, 04/2021); Esophagogastroduodenoscopy (egd) with propofol (N/A, 11/21/2022); and RIGHT/LEFT HEART CATH AND CORONARY ANGIOGRAPHY (N/A, 11/27/2022).  Brief Summary:  Patricia [name] is a 52 y.o. 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  She had viral respiratory infection in May 2022 and developed symptoms after this.      Subjective:   Since her last visit she has been in hospital 3 times.  Most recently after getting RSV infection.  She finished prednisone and antibiotic.  Doesn't feel like prednisone helped much.    She had heart catheterization.  Showed pulmonary hypertension felt to be related to pulmonary disease.  She was advised to resume CPAP.  She has been using this again.  She is supposed to have oxygen hooked up with her CPAP, but she has trouble getting tubes tangled sometimes.  She gets sinus congestion at  times and makes it hard to use CPAP.  Flonase didn't help.  She has been using ipratropium.  Physical Exam:   Appearance - well kempt, wearing oxygen  ENMT - no sinus tenderness, no oral exudate, no LAN, Mallampati 4 airway, no stridor  Respiratory - equal breath sounds bilaterally, no wheezing or rales  CV - s1s2 regular rate and rhythm, no murmurs  Ext - no clubbing, no edema  Skin - no rashes  Psych - normal mood and affect      Pulmonary testing:  PFT 01/09/18 >> FEV1 1.60 (70%), FEV1% 89, TLC 3.26 (66%), DLCO 55% PFT 10/17/21 >> FEV1 1.28 (57%), FEV1% 89, TLC 3.28 (66%), DLCO 27% Serology 10/31/21 >> ANCA, RF negative; ESR 11 PFT 10/24/22 >> FEV1 1.51 (56%), FEV1% 86, TLC 3.56 (72%), DLCO 33%  Chest Imaging:  CT angio chest 06/26/21 >> mosaic attenuation of the airspaces, bandlike scarring at bases HRCT chest 10/11/21 >> mild, mosaic attenuation of airspaces, mild air trapping, scarring at bases CT angio chest 11/25/21 >> diffuse b/l GGO and consolidation concerning for pneumonia HRCT chest 10/30/22 >> very mild diffuse ground-glass attenuation and interlobular septal thickening, mild air trapping, patchy peripheral predominant areas of nodular airspace consolidation are also noted scattered throughout, mild septal thickening and subpleural reticulation  Cardiac Tests:  Echo 11/19/22 >> EF 70 to 75%, mild LVH, grade 1 DD, mild AS RHC/LHC 11/27/22 >> normal coronaries, LVEDP 18, PCWP 30/28/22, PAP 76/21/43  Social History:  She  reports that she has never smoked. She has never used smokeless tobacco. She reports that she does not drink alcohol and does not use drugs.  Family History:  Her family history includes Diabetes Mellitus II in her mother; Heart block in her mother; Hypertension in her mother, sister, and sister.     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 - intolerant of breztri - trial of  prednisone and azithromycin didn't help - continue symbicort 160 two puffs bid, singulair 10 mg qhs - prn albuterol - she has a nebulizer machine  Recent RSV infection. - slowly improving - prn antitussive regimen  Chronic respiratory failure with hypoxia. - 2 liters oxygen with exertion and sleep - gets supplies from [facility]  Obstructive sleep apnea. - she has resumed CPAP - will get a copy of her download from [place] - might need an in lab titration study to determine if she needs adjustment to pressure settings or supplemental oxygen  Obesity. - discussed importance of weight loss  ESRD on iHD. - followed by [facility]  Coronary artery disease, Chronic diastolic CHF, Pulmonary hypertension. - followed by Dr. Dorris Carnes with cardiology  Thyroid nodule. - followed by Dr. Dorris Fetch with Endocrinology  Time Spent Involved in Patient Care on Day of Examination:  38 minutes  Follow up:   Patient Instructions  Will get a copy of your CPAP report from [place]  Follow up in 4 months  Medication List:   Allergies as of 12/27/2022       Reactions   Amlodipine Besylate Rash, Other (See Comments)   dizziness   Reglan [metoclopramide] Other (See Comments)   hallucinations        Medication List        Accurate as of December 27, 2022 10:15 AM. If you have any questions, ask your nurse or doctor.          acetaminophen 325 MG tablet Commonly known as: TYLENOL Take 650 mg by mouth every 6 (six) hours as needed for mild pain or fever.   albuterol (2.5 MG/3ML) 0.083% nebulizer solution Commonly known as: PROVENTIL Take 3 mLs (2.5 mg total) by nebulization every 6 (six) hours as needed for wheezing or shortness of breath.   albuterol 108 (90 Base) MCG/ACT inhaler Commonly known as: Ventolin HFA Inhale 2 puffs into the lungs every 6 (six) hours as needed for wheezing or shortness of breath.   aspirin EC 81 MG tablet Take 1  tablet (81 mg total) by mouth daily. Swallow whole.   cetirizine 10 MG tablet Commonly known as: ZYRTEC Take 10 mg by mouth daily.   cinacalcet 60 MG tablet Commonly known as: SENSIPAR Take 30 mg by mouth daily with breakfast.   dicyclomine 10 MG capsule Commonly known as: BENTYL Take 1 capsule (10 mg total) by mouth 2 (two) times daily as needed. What changed: reasons to take this   docusate sodium 100 MG capsule Commonly known as: COLACE Take 100 mg by mouth daily.   ferric citrate 1 GM 210 MG(Fe) tablet Commonly known as: AURYXIA Take 2 tablets by mouth 2 (two) times daily with a meal. With Breakfast & with supper   glipiZIDE 10 MG tablet Commonly known as: GLUCOTROL Take 15 mg by mouth 2 (two) times daily.   ipratropium 0.03 % nasal spray Commonly known as: ATROVENT Place 2 sprays into both nostrils 2 (two) times daily.   labetalol 100 MG tablet Commonly known as: NORMODYNE Take 1.5 tablets (150 mg total) by mouth 2 (two) times daily. What changed: how much to take   linaclotide 290 MCG Caps capsule Commonly  known as: LINZESS Take 290 mcg by mouth daily before breakfast.   minoxidil 2.5 MG tablet Commonly known as: LONITEN Take 1 tablet (2.5 mg total) by mouth 2 (two) times daily.   montelukast 10 MG tablet Commonly known as: SINGULAIR TAKE 1 TABLET BY MOUTH EVERYDAY AT BEDTIME What changed: See the new instructions.   multivitamin Tabs tablet Take 1 tablet by mouth daily.   nitroGLYCERIN 0.4 MG SL tablet Commonly known as: Nitrostat Place 1 tablet (0.4 mg total) under the tongue every 5 (five) minutes as needed for chest pain.   ondansetron 4 MG disintegrating tablet Commonly known as: ZOFRAN-ODT Take 1 tablet (4 mg total) by mouth as needed for nausea or vomiting.   pantoprazole 40 MG tablet Commonly known as: PROTONIX Take 1 tablet (40 mg total) by mouth 2 (two) times daily. 30 minutes before breakfast   rOPINIRole 2 MG 24 hr tablet Commonly  known as: REQUIP XL Take 2 mg by mouth at bedtime.   rosuvastatin 10 MG tablet Commonly known as: CRESTOR Take 1 tablet (10 mg total) by mouth daily.   sevelamer carbonate 800 MG tablet Commonly known as: RENVELA Take 800 mg by mouth See admin instructions. Take 2 tablets (1600 mg) in the morning, take 3 tablets (2400 mg) with lunch or snack,  and take 2 tablets (1600 mg) by mouth in the evening with dinner meal.   sodium chloride 0.65 % Soln nasal spray Commonly known as: OCEAN Place 1 spray into both nostrils as needed for congestion.   Symbicort 160-4.5 MCG/ACT inhaler Generic drug: budesonide-formoterol Inhale 2 puffs into the lungs daily.   Vitamin D3 125 MCG (5000 UT) Caps TAKE 1 CAPSULE BY MOUTH EVERY DAY        Signature:  Chesley Mires, MD [place] Pager - [phone] 12/27/2022, 10:15 AM

## 2022-12-27 NOTE — Patient Instructions (Signed)
Will get a copy of your CPAP report from [place]  Follow up in 4 months

## 2022-12-28 ENCOUNTER — Ambulatory Visit: Payer: Medicare Other | Admitting: Internal Medicine

## 2022-12-28 DIAGNOSIS — N250 Renal osteodystrophy: Secondary | ICD-10-CM | POA: Diagnosis not present

## 2022-12-28 DIAGNOSIS — D509 Iron deficiency anemia, unspecified: Secondary | ICD-10-CM | POA: Diagnosis not present

## 2022-12-28 DIAGNOSIS — N186 End stage renal disease: Secondary | ICD-10-CM | POA: Diagnosis not present

## 2022-12-28 DIAGNOSIS — N2581 Secondary hyperparathyroidism of renal origin: Secondary | ICD-10-CM | POA: Diagnosis not present

## 2022-12-28 DIAGNOSIS — E049 Nontoxic goiter, unspecified: Secondary | ICD-10-CM | POA: Diagnosis not present

## 2022-12-28 DIAGNOSIS — D631 Anemia in chronic kidney disease: Secondary | ICD-10-CM | POA: Diagnosis not present

## 2022-12-28 DIAGNOSIS — Z992 Dependence on renal dialysis: Secondary | ICD-10-CM | POA: Diagnosis not present

## 2022-12-29 LAB — T4, FREE: Free T4: 1.72 ng/dL (ref 0.82–1.77)

## 2022-12-29 LAB — TSH: TSH: 0.393 u[IU]/mL — ABNORMAL LOW (ref 0.450–4.500)

## 2022-12-29 LAB — T3, FREE: T3, Free: 3.2 pg/mL (ref 2.0–4.4)

## 2022-12-31 DIAGNOSIS — D509 Iron deficiency anemia, unspecified: Secondary | ICD-10-CM | POA: Diagnosis not present

## 2022-12-31 DIAGNOSIS — N186 End stage renal disease: Secondary | ICD-10-CM | POA: Diagnosis not present

## 2022-12-31 DIAGNOSIS — N2581 Secondary hyperparathyroidism of renal origin: Secondary | ICD-10-CM | POA: Diagnosis not present

## 2022-12-31 DIAGNOSIS — Z992 Dependence on renal dialysis: Secondary | ICD-10-CM | POA: Diagnosis not present

## 2022-12-31 DIAGNOSIS — D631 Anemia in chronic kidney disease: Secondary | ICD-10-CM | POA: Diagnosis not present

## 2022-12-31 DIAGNOSIS — N250 Renal osteodystrophy: Secondary | ICD-10-CM | POA: Diagnosis not present

## 2023-01-01 ENCOUNTER — Ambulatory Visit (INDEPENDENT_AMBULATORY_CARE_PROVIDER_SITE_OTHER): Payer: Medicare Other | Admitting: "Endocrinology

## 2023-01-01 ENCOUNTER — Encounter: Payer: Self-pay | Admitting: "Endocrinology

## 2023-01-01 ENCOUNTER — Ambulatory Visit: Payer: Medicare Other | Admitting: "Endocrinology

## 2023-01-01 VITALS — BP 158/62 | HR 84 | Ht 63.0 in | Wt 240.6 lb

## 2023-01-01 DIAGNOSIS — E1122 Type 2 diabetes mellitus with diabetic chronic kidney disease: Secondary | ICD-10-CM

## 2023-01-01 DIAGNOSIS — M8588 Other specified disorders of bone density and structure, other site: Secondary | ICD-10-CM | POA: Diagnosis not present

## 2023-01-01 DIAGNOSIS — N186 End stage renal disease: Secondary | ICD-10-CM

## 2023-01-01 DIAGNOSIS — E049 Nontoxic goiter, unspecified: Secondary | ICD-10-CM

## 2023-01-01 NOTE — Progress Notes (Signed)
01/01/2023, 4:58 PM   Endocrinology follow-up note  Subjective:    Patient ID: Patricia [name], female    DOB: 1971/05/12, PCP [name], PA-C   Past Medical History:  Diagnosis Date   Anemia    Ankle fracture    Arthritis    Blood transfusion without reported diagnosis    Breast cancer (HCC)    Cancer (HCC)    Diabetes mellitus without complication (HCC)    Dialysis patient (HCC)    mon, wed, friday,    End stage renal disease on dialysis (HCC)    M/W/F [facility] in [place]   GERD (gastroesophageal reflux disease)    Hypertension    OSA (obstructive sleep apnea)    uses CPAP sometimes   Pneumonia    PONV (postoperative nausea and vomiting)    Wears glasses    Past Surgical History:  Procedure Laterality Date   ABDOMINAL HYSTERECTOMY     AV FISTULA PLACEMENT  11/2014   at [facility] hospital   AV FISTULA REPAIR N/A 04/2021   BALLOON DILATION N/A 07/10/2016   Procedure: BALLOON DILATION;  Surgeon: West Bali, MD;  Location: [facility] SU[facility];  Service: Endoscopy;  Laterality: N/A;  Pyloric dilation   BREAST LUMPECTOMY     CESAREAN SECTION     CHOLECYSTECTOMY     COLONOSCOPY WITH PROPOFOL N/A 09/27/2016   Dr. Jena Gauss: Internal hemorrhoids repeat colonoscopy in 10 years   DILATION AND CURETTAGE OF UTERUS     ESOPHAGOGASTRODUODENOSCOPY N/A 07/10/2016   Dr.Fields- normal esophagus, gastric stenosis was found at the pylorus, gastritis on bx, normal examined duodenun   ESOPHAGOGASTRODUODENOSCOPY (EGD) WITH PROPOFOL N/A 11/21/2022   Procedure: ESOPHAGOGASTRODUODENOSCOPY (EGD) WITH PROPOFOL;  Surgeon: Dolores Frame, MD;  Location: [facility];  Service: Gastroenterology;  Laterality: N/A;   EXTERNAL FIXATION REMOVAL Right 10/29/2018   Procedure: REMOVAL RIGHT ANKLE BIOMET ZIMMER EXTERNAL FIXATOR, SHORT LEG CAST APPLICATION;  Surgeon: Eldred Manges, MD;  Location: [facility] OR;  Service: Orthopedics;  Laterality:  Right;   MASTECTOMY     left sided   ORIF ANKLE FRACTURE Right 10/06/2018   Procedure: OPEN REDUCTION INTERNAL FIXATION (ORIF) RIGHT ANKLE TRIMALLEOLAR;  Surgeon: Eldred Manges, MD;  Location: [facility] OR;  Service: Orthopedics;  Laterality: Right;   RIGHT/LEFT HEART CATH AND CORONARY ANGIOGRAPHY N/A 11/27/2022   Procedure: RIGHT/LEFT HEART CATH AND CORONARY ANGIOGRAPHY;  Surgeon: Swaziland, Peter M, MD;  Location: [facility] INVASIVE CV LAB;  Service: Cardiovascular;  Laterality: N/A;   Social History   Socioeconomic History   Marital status: Widowed    Spouse name: Not on file   Number of children: Not on file   Years of education: Not on file   Highest education level: Not on file  Occupational History   Not on file  Tobacco Use   Smoking status: Never   Smokeless tobacco: Never  Vaping Use   Vaping Use: Never used  Substance and Sexual Activity   Alcohol use: No    Alcohol/week: 0.0 standard drinks of alcohol   Drug use: No   Sexual activity: Not Currently  Other Topics Concern   Not on file  Social History Narrative   Not on file  Social Determinants of Health   Financial Resource Strain: Low Risk  (12/18/2022)   Overall Financial Resource Strain (CARDIA)    Difficulty of Paying Living Expenses: Not very hard  Food Insecurity: No Food Insecurity (12/18/2022)   Hunger Vital Sign    Worried About Running Out of Food in the Last Year: Never true    Ran Out of Food in the Last Year: Never true  Transportation Needs: No Transportation Needs (12/18/2022)   PRAPARE - Administrator, Civil Service (Medical): No    Lack of Transportation (Non-Medical): No  Physical Activity: Not on file  Stress: Not on file  Social Connections: Not on file   Family History  Problem Relation Age of Onset   Diabetes Mellitus II Mother    Hypertension Mother    Heart block Mother    Hypertension Sister    Hypertension Sister    Colon cancer Neg Hx    Outpatient Encounter Medications as of  01/01/2023  Medication Sig   acetaminophen (TYLENOL) 325 MG tablet Take 650 mg by mouth every 6 (six) hours as needed for mild pain or fever.   albuterol (PROVENTIL) (2.5 MG/3ML) 0.083% nebulizer solution Take 3 mLs (2.5 mg total) by nebulization every 6 (six) hours as needed for wheezing or shortness of breath.   albuterol (VENTOLIN HFA) 108 (90 Base) MCG/ACT inhaler Inhale 2 puffs into the lungs every 6 (six) hours as needed for wheezing or shortness of breath.   aspirin EC 81 MG tablet Take 1 tablet (81 mg total) by mouth daily. Swallow whole.   cetirizine (ZYRTEC) 10 MG tablet Take 10 mg by mouth daily.   Cholecalciferol (VITAMIN D3) 125 MCG (5000 UT) CAPS TAKE 1 CAPSULE BY MOUTH EVERY DAY   cinacalcet (SENSIPAR) 60 MG tablet Take 30 mg by mouth daily with breakfast.   dicyclomine (BENTYL) 10 MG capsule Take 1 capsule (10 mg total) by mouth 2 (two) times daily as needed. (Patient taking differently: Take 10 mg by mouth 2 (two) times daily as needed for spasms.)   docusate sodium (COLACE) 100 MG capsule Take 100 mg by mouth daily.   ferric citrate (AURYXIA) 1 GM 210 MG(Fe) tablet Take 2 tablets by mouth 2 (two) times daily with a meal. With Breakfast & with supper   glipiZIDE (GLUCOTROL) 10 MG tablet Take 10 mg by mouth 2 (two) times daily with a meal.   ipratropium (ATROVENT) 0.03 % nasal spray Place 2 sprays into both nostrils 2 (two) times daily.    labetalol (NORMODYNE) 100 MG tablet Take 1.5 tablets (150 mg total) by mouth 2 (two) times daily. (Patient taking differently: Take 100 mg by mouth 2 (two) times daily.)   linaclotide (LINZESS) 290 MCG CAPS capsule Take 290 mcg by mouth daily before breakfast.   montelukast (SINGULAIR) 10 MG tablet TAKE 1 TABLET BY MOUTH EVERYDAY AT BEDTIME (Patient taking differently: Take 10 mg by mouth at bedtime.)   multivitamin (RENA-VIT) TABS tablet Take 1 tablet by mouth daily.   nitroGLYCERIN (NITROSTAT) 0.4 MG SL tablet Place 1 tablet (0.4 mg total) under  the tongue every 5 (five) minutes as needed for chest pain.   ondansetron (ZOFRAN-ODT) 4 MG disintegrating tablet Take 1 tablet (4 mg total) by mouth as needed for nausea or vomiting.   pantoprazole (PROTONIX) 40 MG tablet Take 1 tablet (40 mg total) by mouth 2 (two) times daily. 30 minutes before breakfast   rOPINIRole (REQUIP XL) 2 MG 24 hr tablet Take 2  mg by mouth at bedtime.    rosuvastatin (CRESTOR) 10 MG tablet Take 1 tablet (10 mg total) by mouth daily.   sevelamer carbonate (RENVELA) 800 MG tablet Take 800 mg by mouth See admin instructions. Take 2 tablets (1600 mg) in the morning, take 3 tablets (2400 mg) with lunch or snack,  and take 2 tablets (1600 mg) by mouth in the evening with dinner meal.   sodium chloride (OCEAN) 0.65 % SOLN nasal spray Place 1 spray into both nostrils as needed for congestion.   SYMBICORT 160-4.5 MCG/ACT inhaler Inhale 2 puffs into the lungs daily.   [DISCONTINUED] minoxidil (LONITEN) 2.5 MG tablet Take 1 tablet (2.5 mg total) by mouth 2 (two) times daily.   No facility-administered encounter medications on file as of 01/01/2023.   ALLERGIES: Allergies  Allergen Reactions   Amlodipine Besylate Rash and Other (See Comments)    dizziness   Reglan [Metoclopramide] Other (See Comments)    hallucinations     VACCINATION STATUS: Immunization History  Administered Date(s) Administered   Influenza Split 09/20/2015   Influenza,trivalent, recombinat, inj, PF 09/20/2015   Influenza-Unspecified 09/30/2014, 08/25/2015, 09/06/2016, 08/19/2017, 10/04/2017, 10/15/2018, 09/25/2019, 10/03/2020, 08/31/2022   Moderna SARS-COV2 Booster Vaccination 11/07/2021   Moderna Sars-Covid-2 Vaccination 03/02/2020, 03/30/2020, 05/02/2020, 12/16/2020   PPD Test 08/10/2015, 09/06/2015, 02/10/2016, 09/09/2017, 10/20/2018, 11/23/2019, 11/30/2020, 11/08/2021, 12/06/2021   Pneumococcal Polysaccharide-23 01/03/2022   Pneumococcal-Unspecified 03/02/2016, 02/20/2017   Unspecified  SARS-COV-2 Vaccination 03/02/2020, 03/30/2020    HPI Patricia [name] is 52 y.o. female who presents today with a medical history as above. she is being seen in follow-up after she was seen in consultation for nodular goiter requested by Jake Samples, PA-C.   She is status post fine-needle aspiration biopsy with benign outcomes.  She has no new complaints about her thyroid today.  See notes from previous visit. She underwent thyroid ultrasound on January 04, 2022 which showed significant goiter with right lobe measuring from 1.1 cm and left lobe measuring 4.5 cm.  She was found to have 4.6 cm nodule in the right lobe.  She underwent fine-needle aspiration of the biopsy in March with benign outcomes.   Her recent thyroid function tests show mild subclinical hypothyroidism. She denies any prior history of thyroid dysfunction.  She denies dysphagia, shortness of breath, nor voice change.  She denies any family history of thyroid malignancy.  However, patient has personal history of breast cancer, status post lobectomy in 2019.  Her other medical problem includes type 2 diabetes complicated by ESRD on hemodialysis.  The cause of  ESRD is said to be hypertension, diabetes.  She would like to address her diabetes with me at this meeting.  He did not bring any logs nor meter with her.  Her recent A1c was 9.5%.  She is currently taking glipizide 15 mg twice daily.  She would like to avoid insulin treatment.  She has advanced COPD/asthma. Patient is on multiple medications.  Not currently on steroids for  Review of Systems  Constitutional: + She reports fluctuating body weight, + fatigue, no subjective hyperthermia, no subjective hypothermia   Objective:       01/01/2023   12:59 PM 12/27/2022    8:50 AM 12/14/2022   10:10 PM  Vitals with BMI  Height 5\' 3"  5\' 3"    Weight 240 lbs 10 oz 239 lbs   BMI 28.78 67.67   Systolic 209 470 962  Diastolic 62 82 60  Pulse 84 93 81    BP (!) 158/62  Pulse 84   Ht 5\' 3"  (1.6 m)   Wt 240 lb 9.6 oz (109.1 kg)   BMI 42.62 kg/m   Wt Readings from Last 3 Encounters:  01/01/23 240 lb 9.6 oz (109.1 kg)  12/27/22 239 lb (108.4 kg)  12/14/22 227 lb 4.7 oz (103.1 kg)    Physical Exam  Constitutional:  Body mass index is 42.62 kg/m.,  not in acute distress, normal state of mind Eyes: PERRLA, EOMI, no exophthalmos ENT: moist mucous membranes, + gross thyromegaly, no gross cervical lymphadenopathy   CMP ( most recent) CMP     Component Value Date/Time   NA 138 12/14/2022 0432   K 4.3 12/14/2022 0432   CL 97 (L) 12/14/2022 0432   CO2 25 12/14/2022 0432   GLUCOSE 223 (H) 12/14/2022 0432   BUN 70 (H) 12/14/2022 0432   CREATININE 10.85 (H) 12/14/2022 0432   CREATININE 5.93 (H) 06/29/2019 1210   CALCIUM 8.9 12/14/2022 0432   PROT 6.7 12/11/2022 0351   PROT 7.8 05/17/2022 1607   ALBUMIN 3.7 12/11/2022 0351   ALBUMIN 4.9 05/17/2022 1607   AST 24 12/11/2022 0351   ALT 48 (H) 12/11/2022 0351   ALKPHOS 103 12/11/2022 0351   BILITOT 0.7 12/11/2022 0351   BILITOT 0.4 05/17/2022 1607   GFRNONAA 4 (L) 12/14/2022 0432   GFRNONAA 8 (L) 06/29/2019 1210   GFRAA 4 (L) 08/04/2020 0452   GFRAA 9 (L) 06/29/2019 1210     Diabetic Labs (most recent): Lab Results  Component Value Date   HGBA1C 9.5 (H) 11/19/2022   HGBA1C 8.3 (H) 06/26/2021   HGBA1C 8.4 (H) 08/04/2020     Lipid Panel ( most recent) Lipid Panel     Component Value Date/Time   CHOL 185 05/17/2022 1607   TRIG 58 05/17/2022 1607   HDL 108 05/17/2022 1607   CHOLHDL 1.9 01/04/2022 1003   VLDL 10 01/04/2022 1003   LDLCALC 66 05/17/2022 1607   LABVLDL 11 05/17/2022 1607   Summary of her thyroid ultrasound from January 05, 2022: Right lobe 7.1 cm, 4.6 cm nodule Left lobe 4.5 cm.   Fine-needle aspiration biopsy on February 27, 2022: FINAL MICROSCOPIC DIAGNOSIS:  - Consistent with benign follicular nodule (Bethesda category II)   SPECIMEN ADEQUACY:  Satisfactory for  evaluation    Assessment & Plan:   1.  Type 2 diabetes-uncontrolled Complicated by ESRD currently on hemodialysis. She is approached to start monitoring blood glucose at least twice a day-before breakfast and bedtime and return in 10 days with her meter and logs.  In the meantime, she is advised to lower her glipizide to 10 mg p.o. twice daily-with breakfast and supper.  She is not a candidate for metformin.  She will be considered for either basal insulin or GLP-1 receptor agonists based on her presentation glycemic profile during her next visit.  2. Nodular goiter I reviewed her labs with her. Her thyroid function tests are consistent with mild subclinical hyperthyroidism .  She will not need intervention with thyroid hormone nor antithyroid therapy at this time. - she has nodular goiter which was worked up completely including with fine-needle aspiration with benign outcomes.  She will not need surgical intervention at this time.  She does not have any compressive concerns at this time.  Due to the sheer size of her thyroid which is asymmetrically enlarged with 7.1 cm right lobe, 4.5 cm left lobe , she will need surveillance thyroid ultrasound in 1 year.   This patient will  need bone density in the next subsequent days.  - I did not initiate any new prescriptions today. She has several comorbid conditions including type 2 diabetes, hypertension, ESRD on hemodialysis, hyperlipidemia, COPD/asthma, hyperlipidemia.  - she is advised to maintain close follow up with [name], PA-C for primary care needs.    I spent 30 minutes in the care of the patient today including review of labs from Thyroid Function, CMP, and other relevant labs ; imaging/biopsy records (current and previous including abstractions from other facilities); face-to-face time discussing  her lab results and symptoms, medications doses, her options of short and [place] term treatment based on the latest standards of care  / guidelines;   and documenting the encounter.  Sharmain Sadler  participated in the discussions, expressed understanding, and voiced agreement with the above plans.  All questions were answered to her satisfaction. she is encouraged to contact clinic should she have any questions or concerns prior to her return visit.   Follow up plan: Return in about 10 days (around 01/11/2023) for F/U with Meter/CGM /Logs Only - no Labs.   Marquis Lunch, MD [facility] [address] [place], [place] [address] Phone: [phone]  Fax: [phone]     01/01/2023, 4:58 PM  This note was partially dictated with voice recognition software. Similar sounding words can be transcribed inadequately or may not  be corrected upon review.

## 2023-01-01 NOTE — Patient Instructions (Signed)

## 2023-01-02 DIAGNOSIS — N2581 Secondary hyperparathyroidism of renal origin: Secondary | ICD-10-CM | POA: Diagnosis not present

## 2023-01-02 DIAGNOSIS — Z992 Dependence on renal dialysis: Secondary | ICD-10-CM | POA: Diagnosis not present

## 2023-01-02 DIAGNOSIS — N186 End stage renal disease: Secondary | ICD-10-CM | POA: Diagnosis not present

## 2023-01-02 DIAGNOSIS — D631 Anemia in chronic kidney disease: Secondary | ICD-10-CM | POA: Diagnosis not present

## 2023-01-02 DIAGNOSIS — D509 Iron deficiency anemia, unspecified: Secondary | ICD-10-CM | POA: Diagnosis not present

## 2023-01-02 DIAGNOSIS — M8588 Other specified disorders of bone density and structure, other site: Secondary | ICD-10-CM | POA: Insufficient documentation

## 2023-01-02 DIAGNOSIS — N250 Renal osteodystrophy: Secondary | ICD-10-CM | POA: Diagnosis not present

## 2023-01-03 ENCOUNTER — Telehealth: Payer: Self-pay

## 2023-01-03 ENCOUNTER — Encounter: Payer: Self-pay | Admitting: *Deleted

## 2023-01-03 ENCOUNTER — Ambulatory Visit: Payer: Self-pay | Admitting: *Deleted

## 2023-01-03 NOTE — Telephone Encounter (Signed)
Pt called stating her BG continues to be elevated.     Date Before breakfast Before lunch Before supper Bedtime  01/01/23 HI   451  01/02/23 431   424  01/03/23 537             Pt taking: glipizide 10mg  bid.

## 2023-01-03 NOTE — Telephone Encounter (Signed)
Pt states she has used insulin before when she was pregnant.

## 2023-01-04 ENCOUNTER — Other Ambulatory Visit: Payer: Self-pay | Admitting: "Endocrinology

## 2023-01-04 DIAGNOSIS — N2581 Secondary hyperparathyroidism of renal origin: Secondary | ICD-10-CM | POA: Diagnosis not present

## 2023-01-04 DIAGNOSIS — D509 Iron deficiency anemia, unspecified: Secondary | ICD-10-CM | POA: Diagnosis not present

## 2023-01-04 DIAGNOSIS — N186 End stage renal disease: Secondary | ICD-10-CM | POA: Diagnosis not present

## 2023-01-04 DIAGNOSIS — Z992 Dependence on renal dialysis: Secondary | ICD-10-CM | POA: Diagnosis not present

## 2023-01-04 DIAGNOSIS — D631 Anemia in chronic kidney disease: Secondary | ICD-10-CM | POA: Diagnosis not present

## 2023-01-04 DIAGNOSIS — E1122 Type 2 diabetes mellitus with diabetic chronic kidney disease: Secondary | ICD-10-CM

## 2023-01-04 DIAGNOSIS — N250 Renal osteodystrophy: Secondary | ICD-10-CM | POA: Diagnosis not present

## 2023-01-04 MED ORDER — INSULIN GLARGINE 100 UNIT/ML SOLOSTAR PEN: 20.0000 [IU] | PEN_INJECTOR | Freq: Every day | SUBCUTANEOUS | 1 refills | Status: DC

## 2023-01-04 MED ORDER — LANTUS SOLOSTAR 100 UNIT/ML ~~LOC~~ SOPN: 20.0000 [IU] | PEN_INJECTOR | Freq: Every day | SUBCUTANEOUS | 2 refills | Status: DC

## 2023-01-04 NOTE — Telephone Encounter (Signed)
Discussed with pt, understanding voiced.

## 2023-01-07 DIAGNOSIS — N186 End stage renal disease: Secondary | ICD-10-CM | POA: Diagnosis not present

## 2023-01-07 DIAGNOSIS — Z992 Dependence on renal dialysis: Secondary | ICD-10-CM | POA: Diagnosis not present

## 2023-01-07 DIAGNOSIS — D509 Iron deficiency anemia, unspecified: Secondary | ICD-10-CM | POA: Diagnosis not present

## 2023-01-07 DIAGNOSIS — D631 Anemia in chronic kidney disease: Secondary | ICD-10-CM | POA: Diagnosis not present

## 2023-01-07 DIAGNOSIS — N250 Renal osteodystrophy: Secondary | ICD-10-CM | POA: Diagnosis not present

## 2023-01-07 DIAGNOSIS — N2581 Secondary hyperparathyroidism of renal origin: Secondary | ICD-10-CM | POA: Diagnosis not present

## 2023-01-07 NOTE — Patient Outreach (Signed)
Care Coordination   Follow Up Visit Note   01/03/2023 Name: Patricia [name] MRN: [id number] DOB: [birth year]  Patricia [name] is a 52 y.o. year old female who sees [name], [place] for primary care. I spoke with  Patricia [name] by phone today.  What matters to the patients health and wellness today?  Managing blood sugar, obtain new breast prosthesis, obtain lightweight POC    Goals Addressed             This Visit's Progress    COMPLETED: Care Coordination Services       Care Coordination Interventions: Evaluation of current treatment plan related to Discharge from [facility] on 12/[facility]/22 for acute pulmonary edema and patient's adherence to plan as established by provider Provided education to patient re: discharge instructions. Continue home O2 of 2L, dialysis 3 days per week (M,W,F), f/u with Dr Fransico Him, PCP, and Dr Craige Cotta Reviewed medications with patient and discussed access and affordability. Prescribed prednisone taper. Talked with Dr Evlyn Courier office and has resumed doxycycline b/c xray showed possible pneumonia Collaborated with Dr Fransico Him regarding need for blood sugar management due to hyperglycemia from frequent prednisone use to treat pulmonary conditions. Needs f/u appt. His office to reach out and schedule.  Reviewed scheduled/upcoming provider appointments including Dr Craige Cotta (pulmonary) 12/27/22 at 8:45. Verified transportation.  Discussed plans with patient for ongoing care management follow up and provided patient with direct contact information for care management team Assessed social determinant of health barriers Discussed need for PCP follow-up in 1-2 weeks. Patient to call and schedule.  advised to call pulmonologist or PCP or seek emergency medical attention if needed for any new or worsening symptoms Reviewed and discussed medications, access, and affordability.  Taking glipizide for blood sugar but poorly controlled due to prednisone Discussed recommended diet Provided with  [facility] telephone number and encouraged to reach out as needed [facility] telephone f/u appt scheduled for 01/03/23      Manage Blood Sugar       Care Coordination Interventions: Provided education to patient about basic DM disease process Reviewed medications with patient and discussed importance of medication adherence Counseled on importance of regular laboratory monitoring as prescribed Discussed plans with patient for ongoing care management follow up and provided patient with direct contact information for care management team Advised patient, providing education and rationale, to check cbg at least twice daily, before breakfast and bedtime and record, calling Dr Fransico Him for findings outside established parameters Review of patient status, including review of consultants reports, relevant laboratory and other test results, and medications completed Assessed social determinant of health barriers Therapeutic listening utilized regarding patient's confusion and frustration with elevated blood sugar despite no real changes in lifestyle. Her blood sugar has gone up significantly. It made sense while she was on steroids and in the weeks after, but levels are even higher now and she doesn't know why.  Reinforced diet/nutrition and exercise recommendations provided by Dr Fransico Him Reinforced to take blood sugar log and meter to follow-up visit with Dr Fransico Him on 01/15/23 Advised she may want to check her meter accuracy by comparing reading at next visit Discussed medication cost.  Provided education on Medicare Extra Help for Prescription Drug Coverage. Emailed link for application and more information Encouraged to reach out to [facility] as needed     Obtain DME       Care Coordination Interventions: Advised patient to discuss need for new breast prosthesis with her oncologist at upcoming appointment. They can give her an  order to take to a Medicare DME company that provides these prosthetics.  Collaborated with [facility] regarding desire for lightweight portable oxygen concentrator. Provided patient with education on "walk test" that is needed to qualify for medicare coverage of POC. Could be done at next appt. Currently uses [facility]. Order will have to be sent there. Provided education on how Medicare contracts with an oxygen DME supplier for 5 year periods.  Discussed plans with patient for ongoing care management follow up and provided patient with direct contact information for care management team Assessed social determinant of health barriers Encouraged to reach out to [facility] as needed        SDOH assessments and interventions completed:  Yes  SDOH Interventions Today    Flowsheet Row Most Recent Value  SDOH Interventions   Housing Interventions Intervention Not Indicated        Care Coordination Interventions:  Yes, provided   Follow up plan: Follow up call scheduled for 01/24/23    Encounter Outcome:  Pt. Visit Completed   Demetrios Loll, BSN, RN-BC RN Care Coordinator [facility]  [facility] Direct Dial: [phone] Main #: [phone]  *Late entry for visit on 01/03/23

## 2023-01-08 ENCOUNTER — Telehealth: Payer: Self-pay | Admitting: *Deleted

## 2023-01-08 DIAGNOSIS — Z992 Dependence on renal dialysis: Secondary | ICD-10-CM | POA: Diagnosis not present

## 2023-01-08 DIAGNOSIS — E8770 Fluid overload, unspecified: Secondary | ICD-10-CM | POA: Diagnosis not present

## 2023-01-08 DIAGNOSIS — N186 End stage renal disease: Secondary | ICD-10-CM | POA: Diagnosis not present

## 2023-01-08 NOTE — Telephone Encounter (Signed)
Spoke with the pt and scheduled appt with Patricia [name] in [place]- VS had no openings coming up in [place]  Pt given the office address  Nothing further needed

## 2023-01-08 NOTE — Telephone Encounter (Signed)
-----  Message from Gwenith Daily, RN sent at 01/03/2023  1:58 PM EST ----- Regarding: Architectural technologist Patient had a recent visit with Dr Craige Cotta and forgot to ask about a lightweight portable oxygen concentrator. She uses [facility]. I advised that she would need a walk test in order to qualify. Her next appt with Dr Craige Cotta is in May. Can she get in sooner or can you make a note to address this at that next appt? Patient would like a call back if possible.   Thank you!  Sharee Pimple, BSN, RN-BC RN Care Coordinator [facility]  [facility] Direct Dial: [phone] Main #: [phone]

## 2023-01-09 ENCOUNTER — Telehealth: Payer: Self-pay | Admitting: *Deleted

## 2023-01-09 ENCOUNTER — Other Ambulatory Visit: Payer: Self-pay | Admitting: Internal Medicine

## 2023-01-09 DIAGNOSIS — D509 Iron deficiency anemia, unspecified: Secondary | ICD-10-CM | POA: Diagnosis not present

## 2023-01-09 DIAGNOSIS — D631 Anemia in chronic kidney disease: Secondary | ICD-10-CM | POA: Diagnosis not present

## 2023-01-09 DIAGNOSIS — N186 End stage renal disease: Secondary | ICD-10-CM | POA: Diagnosis not present

## 2023-01-09 DIAGNOSIS — N2581 Secondary hyperparathyroidism of renal origin: Secondary | ICD-10-CM | POA: Diagnosis not present

## 2023-01-09 DIAGNOSIS — Z992 Dependence on renal dialysis: Secondary | ICD-10-CM | POA: Diagnosis not present

## 2023-01-09 DIAGNOSIS — N250 Renal osteodystrophy: Secondary | ICD-10-CM | POA: Diagnosis not present

## 2023-01-09 NOTE — Patient Outreach (Signed)
  Care Coordination   Multidisciplinary Case Review Note    01/09/2023 Name: Willamina Grieshop MRN: [id number] DOB: 02/11/1971  Braidyn Peace is a 52 y.o. year old female who sees Jake Samples, [place] for primary care.  Collaborated with Ina Homes, [place], Certified Health Coach and Renal Coordinator with [place].     Goals Addressed             This Visit's Progress    Care Coordination Services       Care Coordination Interventions: Outreached by Ina Homes, [place], Certified Health Coach with [place] (Renal Coordinator) via email regarding recent hospitalization and outpatient management Provided [name] with update on patient status via email per my last conversation with patient on 01/03/23 Patient's primary concern and the biggest hurdle right now seems to be managing her blood sugar. She's reporting home readings in the 400-500 range and there isn't a clear cause She is following dietary recommendations and has cut out sugars, simple carbs, and artificial sweeteners She has been off of steroids for > 3 weeks and blood sugar is higher now than it was then Patient questioned if her meter is accurate. Considered purchasing a new one.   Reminded to take meter and blood sugar log to next visit with Dr Dorris Fetch on 01/15/23. He is considering insulin therapy. She would like to avoid that, but will try it with the hopes of coming off of it in the future Recommended that she compare her meter for accuracy to the in office reading at appointment on the 30th Plan to discuss [name] during our [place] Complex Case Discussion meeting on 01/10/23 Encouraged [name] to reach out with any additional questions or care coordination opportunities        SDOH assessments and interventions completed:  No     Care Coordination Interventions Activated:  Yes   Care Coordination Interventions:  Yes, provided   Follow up plan: Follow up call scheduled for  01/24/23 Patient to be discussed during [place] Complex Case Review on 01/10/23   Asked [name] to reach out with any additional questions or care coordination opportunities  Multidisciplinary Team Attendees:   Ina Homes, [place], Certified Health Coach  Chong Sicilian, RN Care Coordinator  Scribe for Multidisciplinary Case Review:  Lyn Records, BSN, RN-BC RN Care Coordinator [place]: [phone] Main #: [phone]

## 2023-01-10 ENCOUNTER — Other Ambulatory Visit: Payer: Self-pay | Admitting: Gastroenterology

## 2023-01-10 ENCOUNTER — Telehealth: Payer: Self-pay | Admitting: *Deleted

## 2023-01-10 NOTE — Patient Outreach (Signed)
Care Coordination   Multidisciplinary Case Review Note    01/10/2023 Name: Patricia [name] MRN: [id number] DOB: Jun 28, 1971  Patricia [name] is a 52 y.o. year old female who sees Patricia [name], [place] for primary care.  The multidisciplinary care team met today to review patient care needs and barriers.     Goals      Care Coordination Services     Care Coordination Interventions: Outreached by Patricia [name], [place], Certified Health Coach with [place] (Renal Coordinator) via email regarding recent hospitalization and outpatient management Provided Ms Patricia [name] with update on patient status via email per my last conversation with patient on 01/03/23 Patient's primary concern and the biggest hurdle right now seems to be managing her blood sugar. She's reporting home readings in the 400-500 range and there isn't a clear cause She is following dietary recommendations and has cut out sugars, simple carbs, and artificial sweeteners She has been off of steroids for > 3 weeks and blood sugar is higher now than it was then Patient questioned if her meter is accurate. Considered purchasing a new one.   Reminded to take meter and blood sugar log to next visit with Dr Patricia [name] on 01/15/23. He is considering insulin therapy. She would like to avoid that, but will try it with the hopes of coming off of it in the future Recommended that she compare her meter for accuracy to the in office reading at appointment on the 30th Plan to discuss Ms Patricia [name] during our [place] [facility] Case Discussion meeting on 01/10/23 Encouraged Ms Patricia [name] to reach out with any additional questions or care coordination opportunities     Manage Blood Sugar     Care Coordination Interventions: Provided education to patient about basic DM disease process Reviewed medications with patient and discussed importance of medication adherence Counseled on importance of regular laboratory monitoring as prescribed Discussed plans with  patient for ongoing care management follow up and provided patient with direct contact information for care management team Advised patient, providing education and rationale, to check cbg at least twice daily, before breakfast and bedtime and record, calling Dr Patricia [name] for findings outside established parameters Review of patient status, including review of consultants reports, relevant laboratory and other test results, and medications completed Assessed social determinant of health barriers Therapeutic listening utilized regarding patient's confusion and frustration with elevated blood sugar despite no real changes in lifestyle. Her blood sugar has gone up significantly. It made sense while she was on steroids and in the weeks after, but levels are even higher now and she doesn't know why.  Reinforced diet/nutrition and exercise recommendations provided by Dr Patricia [name] Reinforced to take blood sugar log and meter to follow-up visit with Dr Patricia [name] on 01/15/23 Advised she may want to check her meter accuracy by comparing reading at next visit Discussed medication cost.  Provided education on Medicare Extra Help for Prescription Drug Coverage. Emailed link for application and more information Encouraged to reach out to [facility] as needed     Obtain DME     Care Coordination Interventions: Advised patient to discuss need for new breast prosthesis with her oncologist at upcoming appointment. They can give her an order to take to a Medicare DME company that provides these prosthetics.  Collaborated with [facility] regarding desire for lightweight portable oxygen concentrator. Provided patient with education on "walk test" that is needed to qualify for medicare coverage of POC. Could be done at next appt. Currently uses [facility]. Order  will have to be sent there. Provided education on how Medicare contracts with an oxygen DME supplier for 5 year periods.  Discussed plans with patient for ongoing  care management follow up and provided patient with direct contact information for care management team Assessed social determinant of health barriers Encouraged to reach out to [facility] as needed          SDOH assessments and interventions completed:  No     Care Coordination Interventions Activated:  Yes   Care Coordination Interventions:  Yes, provided   Follow up plan: Follow up call scheduled for 01/24/23 with [facility]    Multidisciplinary Team Attendees:   Patricia Sicilian, RN Patricia Lair, NP  Scribe for Multidisciplinary Case Review:  Patricia [name], BSN, RN-BC [place]: [phone] Main #: [phone]

## 2023-01-11 DIAGNOSIS — N186 End stage renal disease: Secondary | ICD-10-CM | POA: Diagnosis not present

## 2023-01-11 DIAGNOSIS — Z992 Dependence on renal dialysis: Secondary | ICD-10-CM | POA: Diagnosis not present

## 2023-01-11 DIAGNOSIS — D631 Anemia in chronic kidney disease: Secondary | ICD-10-CM | POA: Diagnosis not present

## 2023-01-11 DIAGNOSIS — D509 Iron deficiency anemia, unspecified: Secondary | ICD-10-CM | POA: Diagnosis not present

## 2023-01-11 DIAGNOSIS — N250 Renal osteodystrophy: Secondary | ICD-10-CM | POA: Diagnosis not present

## 2023-01-11 DIAGNOSIS — N2581 Secondary hyperparathyroidism of renal origin: Secondary | ICD-10-CM | POA: Diagnosis not present

## 2023-01-14 ENCOUNTER — Telehealth: Payer: Self-pay | Admitting: *Deleted

## 2023-01-14 DIAGNOSIS — Z992 Dependence on renal dialysis: Secondary | ICD-10-CM | POA: Diagnosis not present

## 2023-01-14 DIAGNOSIS — N186 End stage renal disease: Secondary | ICD-10-CM | POA: Diagnosis not present

## 2023-01-14 DIAGNOSIS — N2581 Secondary hyperparathyroidism of renal origin: Secondary | ICD-10-CM | POA: Diagnosis not present

## 2023-01-14 DIAGNOSIS — D509 Iron deficiency anemia, unspecified: Secondary | ICD-10-CM | POA: Diagnosis not present

## 2023-01-14 DIAGNOSIS — D631 Anemia in chronic kidney disease: Secondary | ICD-10-CM | POA: Diagnosis not present

## 2023-01-14 DIAGNOSIS — N250 Renal osteodystrophy: Secondary | ICD-10-CM | POA: Diagnosis not present

## 2023-01-14 NOTE — Telephone Encounter (Signed)
Called and spoke with patient regarding CPAP machine.  She states that she wears it, however, she took the SD card out to take to [place] and put it in her purse and she has not been able to find it since then.  I asked that she take her machine to [place] so we can get a DL and to get another SD card.  She said she has an appointment in the morning and she will see if she can go by there be fore her appointment if they are open.  Nothing further needed.

## 2023-01-15 ENCOUNTER — Encounter: Payer: Self-pay | Admitting: "Endocrinology

## 2023-01-15 ENCOUNTER — Ambulatory Visit (INDEPENDENT_AMBULATORY_CARE_PROVIDER_SITE_OTHER): Payer: Medicare Other | Admitting: Adult Health

## 2023-01-15 ENCOUNTER — Ambulatory Visit (INDEPENDENT_AMBULATORY_CARE_PROVIDER_SITE_OTHER): Payer: Medicare Other | Admitting: "Endocrinology

## 2023-01-15 ENCOUNTER — Encounter: Payer: Self-pay | Admitting: Adult Health

## 2023-01-15 VITALS — BP 136/84 | HR 91 | Ht 63.0 in | Wt 236.6 lb

## 2023-01-15 VITALS — BP 164/82 | HR 88 | Ht 63.0 in | Wt 237.4 lb

## 2023-01-15 DIAGNOSIS — E049 Nontoxic goiter, unspecified: Secondary | ICD-10-CM

## 2023-01-15 DIAGNOSIS — N186 End stage renal disease: Secondary | ICD-10-CM

## 2023-01-15 DIAGNOSIS — J849 Interstitial pulmonary disease, unspecified: Secondary | ICD-10-CM

## 2023-01-15 DIAGNOSIS — G4733 Obstructive sleep apnea (adult) (pediatric): Secondary | ICD-10-CM | POA: Diagnosis not present

## 2023-01-15 DIAGNOSIS — J9611 Chronic respiratory failure with hypoxia: Secondary | ICD-10-CM | POA: Diagnosis not present

## 2023-01-15 DIAGNOSIS — E1122 Type 2 diabetes mellitus with diabetic chronic kidney disease: Secondary | ICD-10-CM

## 2023-01-15 DIAGNOSIS — J4481 Bronchiolitis obliterans and bronchiolitis obliterans syndrome: Secondary | ICD-10-CM

## 2023-01-15 MED ORDER — TRESIBA FLEXTOUCH 200 UNIT/ML ~~LOC~~ SOPN: 40.0000 [IU] | PEN_INJECTOR | Freq: Every day | SUBCUTANEOUS | 1 refills | Status: DC

## 2023-01-15 MED ORDER — ALBUTEROL SULFATE HFA 108 (90 BASE) MCG/ACT IN AERS: 2.0000 | INHALATION_SPRAY | Freq: Four times a day (QID) | RESPIRATORY_TRACT | 3 refills | Status: AC | PRN

## 2023-01-15 NOTE — Progress Notes (Signed)
@Patient  ID: Patricia [name], female    DOB: 1971-02-19, 52 y.o.   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, chronic respiratory failure on oxygen with activity and at bedtime and obstructive sleep apnea Complex medical history with end-stage renal disease on dialysis, chronic diastolic heart failure, pulmonary hypertension and coronary artery disease, breast cancer hx,   TEST/EVENTS :  PFT 01/09/18 >> FEV1 1.60 (70%), FEV1% 89, TLC 3.26 (66%), DLCO 55% PFT 10/17/21 >> FEV1 1.28 (57%), FEV1% 89, TLC 3.28 (66%), DLCO 27% Serology 10/31/21 >> ANCA, RF negative; ESR 11 PFT 10/24/22 >> FEV1 1.51 (56%), FEV1% 86, TLC 3.56 (72%), DLCO 33%   Chest Imaging:  CT angio chest 06/26/21 >> mosaic attenuation of the airspaces, bandlike scarring at bases HRCT chest 10/11/21 >> mild, mosaic attenuation of airspaces, mild air trapping, scarring at bases CT angio chest 11/25/21 >> diffuse b/l GGO and consolidation concerning for pneumonia HRCT chest 10/30/22 >> very mild diffuse ground-glass attenuation and interlobular septal thickening, mild air trapping, patchy peripheral predominant areas of nodular airspace consolidation are also noted scattered throughout, mild septal thickening and subpleural reticulation   Cardiac Tests:  Echo 11/19/22 >> EF 70 to 75%, mild LVH, grade 1 DD, mild AS RHC/LHC 11/27/22 >> normal coronaries, LVEDP 18, PCWP 30/28/22, PAP 76/21/43  01/15/2023 Follow up; ILD with obliterative bronchiolitis, chronic respiratory failure and sleep apnea. Patient presents for a follow-up visit.  Patient is here today to be evaluated for a portable system for oxygen.  She is on oxygen 2 to 3 L at home.  She wants a portable oxygen concentrator.  Today in the office walk test on pulsing oxygen patient was  not able to keep O2 saturations greater than 88 to 90%.  She required 2 L at rest to maintain O2 saturations greater than 88 to 90% she required 3 L continuous flow to keep O2 saturations greater than 88 to 90% with ambulation.  Patient remains on Symbicort twice daily.  Singulair daily.  She says overall she is feeling some better.  Cough is better.  She does get short of breath with heavy activities.  Patient did recently have RSV 1 month ago.  Says that she is starting to do better since this.  But definitely feels it affected her breathing and she is not back to baseline yet She denies any flare of wheezing.  No fever or hemoptysis or chest pain. She remains on CPAP at bedtime.  Patient says she does try to wear CPAP at nighttime.  Patient says she does miss the night occasionally.  CPAP download shows minimum usage.  Daily average usage around 4 hours.  Patient says she just recently started back on her CPAP.  She was encouraged to wear her CPAP all night [place].  AHI was 2.0 on CPAP 11 cm H2O.   Allergies  Allergen Reactions   Amlodipine Besylate Rash and Other (See Comments)    dizziness   Reglan [Metoclopramide] Other (See Comments)    hallucinations     Immunization History  Administered Date(s) Administered   Influenza Split 09/20/2015   Influenza,trivalent, recombinat, inj, PF 09/20/2015   Influenza-Unspecified 09/30/2014, 08/25/2015, 09/06/2016, 08/19/2017, 10/04/2017, 10/15/2018, 09/25/2019, 10/03/2020, 08/31/2022   Moderna SARS-COV2 Booster Vaccination 11/07/2021   Moderna Sars-Covid-2 Vaccination 03/02/2020, 03/30/2020, 05/02/2020, 12/16/2020   PPD Test 08/10/2015, 09/06/2015, 02/10/2016,  09/09/2017, 10/20/2018, 11/23/2019, 11/30/2020, 11/08/2021, 12/06/2021   Pneumococcal Polysaccharide-23 01/03/2022   Pneumococcal-Unspecified 03/02/2016, 02/20/2017   Unspecified SARS-COV-2 Vaccination 03/02/2020, 03/30/2020    Past Medical History:  Diagnosis Date   Anemia    Ankle  fracture    Arthritis    Blood transfusion without reported diagnosis    Breast cancer ([place])    Cancer ([place])    Diabetes mellitus without complication ([place])    Dialysis patient ([place])    mon, wed, friday,    End stage renal disease on dialysis ([place])    M/W/F [facility] in [place]   GERD (gastroesophageal reflux disease)    Hypertension    OSA (obstructive sleep apnea)    uses CPAP sometimes   Pneumonia    PONV (postoperative nausea and vomiting)    Wears glasses     Tobacco History: Social History   Tobacco Use  Smoking Status Never  Smokeless Tobacco Never   Counseling given: Not Answered   Outpatient Medications Prior to Visit  Medication Sig Dispense Refill   acetaminophen (TYLENOL) 325 MG tablet Take 650 mg by mouth every 6 (six) hours as needed for mild pain or fever.     albuterol (PROVENTIL) (2.5 MG/3ML) 0.083% nebulizer solution Take 3 mLs (2.5 mg total) by nebulization every 6 (six) hours as needed for wheezing or shortness of breath. 75 mL 12   aspirin EC 81 MG tablet Take 1 tablet (81 mg total) by mouth daily. Swallow whole. 90 tablet 3   cetirizine (ZYRTEC) 10 MG tablet Take 10 mg by mouth daily.     Cholecalciferol (VITAMIN D3) 125 MCG (5000 UT) CAPS TAKE 1 CAPSULE BY MOUTH EVERY DAY 90 capsule 0   cinacalcet (SENSIPAR) 60 MG tablet Take 30 mg by mouth daily with breakfast.     dicyclomine (BENTYL) 10 MG capsule Take 1 capsule (10 mg total) by mouth 2 (two) times daily as needed. (Patient taking differently: Take 10 mg by mouth 2 (two) times daily as needed for spasms.) 60 capsule 5   docusate sodium (COLACE) 100 MG capsule Take 100 mg by mouth daily.     ferric citrate (AURYXIA) 1 GM 210 MG(Fe) tablet Take 2 tablets by mouth 2 (two) times daily with a meal. With Breakfast & with supper     glipiZIDE (GLUCOTROL) 10 MG tablet Take 10 mg by mouth 2 (two) times daily with a meal.     insulin degludec (TRESIBA FLEXTOUCH) 200 UNIT/ML FlexTouch Pen Inject 40 Units into  the skin at bedtime. 9 mL 1   ipratropium (ATROVENT) 0.03 % nasal spray Place 2 sprays into both nostrils 2 (two) times daily.      labetalol (NORMODYNE) 100 MG tablet Take 1.5 tablets (150 mg total) by mouth 2 (two) times daily. (Patient taking differently: Take 100 mg by mouth 2 (two) times daily.) 90 tablet 2   lanthanum (FOSRENOL) 500 MG chewable tablet Chew 1,000 mg by mouth 3 (three) times daily with meals.     linaclotide (LINZESS) 290 MCG CAPS capsule Take 290 mcg by mouth daily before breakfast.     montelukast (SINGULAIR) 10 MG tablet TAKE 1 TABLET BY MOUTH EVERYDAY AT BEDTIME (Patient taking differently: Take 10 mg by mouth at bedtime.) 90 tablet 3   multivitamin (RENA-VIT) TABS tablet Take 1 tablet by mouth daily.     nitroGLYCERIN (NITROSTAT) 0.4 MG SL tablet Place 1 tablet (0.4 mg total) under the tongue every 5 (five) minutes as needed for chest pain. 100 tablet  3   ondansetron (ZOFRAN-ODT) 4 MG disintegrating tablet Take 1 tablet (4 mg total) by mouth as needed for nausea or vomiting. 30 tablet 0   pantoprazole (PROTONIX) 40 MG tablet TAKE 1 TABLET (40 MG TOTAL) BY MOUTH DAILY 30 MINUTES BEFORE BREAKFAST 90 tablet 3   rOPINIRole (REQUIP XL) 2 MG 24 hr tablet Take 2 mg by mouth at bedtime.      rosuvastatin (CRESTOR) 10 MG tablet TAKE 1 TABLET BY MOUTH EVERY DAY 90 tablet 1   sodium chloride (OCEAN) 0.65 % SOLN nasal spray Place 1 spray into both nostrils as needed for congestion. 60 mL 0   SYMBICORT 160-4.5 MCG/ACT inhaler Inhale 2 puffs into the lungs daily.     albuterol (VENTOLIN HFA) 108 (90 Base) MCG/ACT inhaler Inhale 2 puffs into the lungs every 6 (six) hours as needed for wheezing or shortness of breath. 1 each 5   No facility-administered medications prior to visit.     Review of Systems:   Constitutional:   No  weight loss, night sweats,  Fevers, chills,  +fatigue, or  lassitude.  HEENT:   No headaches,  Difficulty swallowing,  Tooth/dental problems, or  Sore  throat,                No sneezing, itching, ear ache, nasal congestion, post nasal drip,   CV:  No chest pain,  Orthopnea, PND, swelling in lower extremities, anasarca, dizziness, palpitations, syncope.   GI  No heartburn, indigestion, abdominal pain, nausea, vomiting, diarrhea, change in bowel habits, loss of appetite, bloody stools.   Resp: .  No chest wall deformity  Skin: no rash or lesions.  GU: no dysuria, change in color of urine, no urgency or frequency.  No flank pain, no hematuria   MS:  No joint pain or swelling.  No decreased range of motion.  No back pain.    Physical Exam  BP 136/84 (BP Location: Right Arm, Patient Position: Sitting, Cuff Size: Large)   Pulse 91   Ht 5\' 3"  (1.6 m)   Wt 236 lb 9.6 oz (107.3 kg)   SpO2 92% Comment: RA  BMI 41.91 kg/m   GEN: A/Ox3; pleasant , NAD, BMI 41   HEENT:  [place]/AT,   NOSE-clear, THROAT-clear, no lesions, no postnasal drip or exudate noted.   NECK:  Supple w/ fair ROM; no JVD; normal carotid impulses w/o bruits; no thyromegaly or nodules palpated; no lymphadenopathy.    RESP  Clear  P & A; w/o, wheezes/ rales/ or rhonchi. no accessory muscle use, no dullness to percussion  CARD:  RRR, no m/r/g, no peripheral edema, pulses intact, no cyanosis or clubbing.  GI:   Soft & nt; nml bowel sounds; no organomegaly or masses detected.   Musco: Warm bil, no deformities or joint swelling noted.   Neuro: alert, no focal deficits noted.    Skin: Warm, no lesions or rashes    Lab Results:  CBC    Component Value Date/Time   WBC 11.6 (H) 12/14/2022 0432   RBC 3.12 (L) 12/14/2022 0432   HGB 8.9 (L) 12/14/2022 0432   HCT 29.2 (L) 12/14/2022 0432   PLT 203 12/14/2022 0432   MCV 93.6 12/14/2022 0432   MCH 28.5 12/14/2022 0432   MCHC 30.5 12/14/2022 0432   RDW 14.8 12/14/2022 0432   LYMPHSABS 0.6 (L) 11/21/2022 0427   MONOABS 0.4 11/21/2022 0427   EOSABS 0.0 11/21/2022 0427   BASOSABS 0.0 11/21/2022 0427    BMET  Component Value Date/Time   NA 138 12/14/2022 0432   K 4.3 12/14/2022 0432   CL 97 (L) 12/14/2022 0432   CO2 25 12/14/2022 0432   GLUCOSE 223 (H) 12/14/2022 0432   BUN 70 (H) 12/14/2022 0432   CREATININE 10.85 (H) 12/14/2022 0432   CREATININE 5.93 (H) 06/29/2019 1210   CALCIUM 8.9 12/14/2022 0432   GFRNONAA 4 (L) 12/14/2022 0432   GFRNONAA 8 (L) 06/29/2019 1210   GFRAA 4 (L) 08/04/2020 0452   GFRAA 9 (L) 06/29/2019 1210    BNP    Component Value Date/Time   BNP 152.0 (H) 11/19/2022 1408    ProBNP No results found for: "PROBNP"  Imaging: No results found.       Latest Ref Rng & Units 10/24/2022    2:58 PM 10/17/2021    9:58 AM 01/09/2018    9:49 AM 11/18/2017    1:02 PM  PFT Results  FVC-Pre L 1.73  1.45  1.79  1.31   FVC-Predicted Pre % 51  52  63  46   FVC-Post L 1.76  1.40  1.81    FVC-Predicted Post % 52  50  64    Pre FEV1/FVC % % 85  89  88  86   Post FEV1/FCV % % 86  90  89    FEV1-Pre L 1.48  1.28  1.58  1.13   FEV1-Predicted Pre % 55  57  69  49   FEV1-Post L 1.51  1.26  1.60    DLCO uncorrected ml/min/mmHg 6.74  5.50  12.64    DLCO UNC% % 33  27  55    DLCO corrected ml/min/mmHg  6.13     DLCO COR %Predicted %  30     DLVA Predicted % 58  57  75    TLC L 3.56  3.28  3.26    TLC % Predicted % 72  66  66    RV % Predicted % 106  101  22      No results found for: "NITRICOXIDE"      Assessment & Plan:   Chronic respiratory failure with hypoxia (HCC) Chronic respiratory failure.  Patient unfortunately does not qualify for a small portable system. Recommend patient use continuous flow with ambulation.  Continue on oxygen 2 L at rest and 3 L with activity.  Plan  Patient Instructions  Continue on Symbicort 2 puffs Twice daily, rinse after use.  Singulair 10mg  At bedtime   Albuterol inhaler As needed   Continue on CPAP At bedtime -wear all night [place] .  Work on healthy weight loss .  Continue on Oxygen 2l/ rest and needs 3L continuous with  exertion  Follow up with Dr. Halford Chessman  in 3 months and As needed      Bronchiolitis obliterans Recent exacerbation with RSV infection last month.  Improved with steroids.  Patient seems to be improving.   Continue on current regimen  Plan  Patient Instructions  Continue on Symbicort 2 puffs Twice daily, rinse after use.  Singulair 10mg  At bedtime   Albuterol inhaler As needed   Continue on CPAP At bedtime -wear all night [place] .  Work on healthy weight loss .  Continue on Oxygen 2l/ rest and needs 3L continuous with exertion  Follow up with Dr. Halford Chessman  in 3 months and As needed      OSA (obstructive sleep apnea) Encouraged to use her CPAP each night.  Patient education on sleep apnea  given  Plan  Patient Instructions  Continue on Symbicort 2 puffs Twice daily, rinse after use.  Singulair 10mg  At bedtime   Albuterol inhaler As needed   Continue on CPAP At bedtime -wear all night [place] .  Work on healthy weight loss .  Continue on Oxygen 2l/ rest and needs 3L continuous with exertion  Follow up with Dr. Halford Chessman  in 3 months and As needed        Rexene Edison, NP 01/15/2023

## 2023-01-15 NOTE — Assessment & Plan Note (Signed)
Chronic respiratory failure.  Patient unfortunately does not qualify for a small portable system. Recommend patient use continuous flow with ambulation.  Continue on oxygen 2 L at rest and 3 L with activity.  Plan  Patient Instructions  Continue on Symbicort 2 puffs Twice daily, rinse after use.  Singulair 10mg  At bedtime   Albuterol inhaler As needed   Continue on CPAP At bedtime -wear all night [place] .  Work on healthy weight loss .  Continue on Oxygen 2l/ rest and needs 3L continuous with exertion  Follow up with Dr. Halford Chessman  in 3 months and As needed

## 2023-01-15 NOTE — Assessment & Plan Note (Signed)
Recent exacerbation with RSV infection last month.  Improved with steroids.  Patient seems to be improving.   Continue on current regimen  Plan  Patient Instructions  Continue on Symbicort 2 puffs Twice daily, rinse after use.  Singulair 10mg  At bedtime   Albuterol inhaler As needed   Continue on CPAP At bedtime -wear all night [place] .  Work on healthy weight loss .  Continue on Oxygen 2l/ rest and needs 3L continuous with exertion  Follow up with Dr. Halford Chessman  in 3 months and As needed

## 2023-01-15 NOTE — Progress Notes (Signed)
01/15/2023, 1:14 PM   Endocrinology follow-up note  Subjective:    Patient ID: Patricia [name], female    DOB: Jun 22, 2007, PCP Jake Samples, PA-C   Past Medical History:  Diagnosis Date   Anemia    Ankle fracture    Arthritis    Blood transfusion without reported diagnosis    Breast cancer ([place])    Cancer ([place])    Diabetes mellitus without complication ([place])    Dialysis patient ([place])    mon, wed, friday,    End stage renal disease on dialysis ([place])    M/W/F [facility] in [place]   GERD (gastroesophageal reflux disease)    Hypertension    OSA (obstructive sleep apnea)    uses CPAP sometimes   Pneumonia    PONV (postoperative nausea and vomiting)    Wears glasses    Past Surgical History:  Procedure Laterality Date   ABDOMINAL HYSTERECTOMY     AV FISTULA PLACEMENT  11/2014   at [place] N/A 04/2021   BALLOON DILATION N/A 07/10/2016   Procedure: BALLOON DILATION;  Surgeon: Danie Binder, MD;  [facility];  Service: Endoscopy;  Laterality: N/A;  Pyloric dilation   BREAST LUMPECTOMY     CESAREAN SECTION     CHOLECYSTECTOMY     COLONOSCOPY WITH PROPOFOL N/A 09/27/2016   Dr. Gala Romney: Internal hemorrhoids repeat colonoscopy in 10 years   DILATION AND CURETTAGE OF UTERUS     ESOPHAGOGASTRODUODENOSCOPY N/A 07/10/2016   Dr.Fields- normal esophagus, gastric stenosis was found at the pylorus, gastritis on bx, normal examined duodenun   ESOPHAGOGASTRODUODENOSCOPY (EGD) WITH PROPOFOL N/A 11/21/2022   Procedure: ESOPHAGOGASTRODUODENOSCOPY (EGD) WITH PROPOFOL;  Surgeon: Harvel Quale, MD;  [facility];  Service: Gastroenterology;  Laterality: N/A;   EXTERNAL FIXATION REMOVAL Right 10/29/2018   Procedure: REMOVAL RIGHT ANKLE BIOMET ZIMMER EXTERNAL FIXATOR, SHORT LEG CAST APPLICATION;  Surgeon: Marybelle Killings, MD;  Location: [facility];  Service: Orthopedics;  Laterality:  Right;   MASTECTOMY     left sided   ORIF ANKLE FRACTURE Right 10/06/2018   Procedure: OPEN REDUCTION INTERNAL FIXATION (ORIF) RIGHT ANKLE TRIMALLEOLAR;  Surgeon: Marybelle Killings, MD;  Location: [facility];  Service: Orthopedics;  Laterality: Right;   RIGHT/LEFT HEART CATH AND CORONARY ANGIOGRAPHY N/A 11/27/2022   Procedure: RIGHT/LEFT HEART CATH AND CORONARY ANGIOGRAPHY;  Surgeon: [place], Peter M, MD;  Location: [facility];  Service: Cardiovascular;  Laterality: N/A;   Social History   Socioeconomic History   Marital status: Widowed    Spouse name: Not on file   Number of children: Not on file   Years of education: Not on file   Highest education level: Not on file  Occupational History   Not on file  Tobacco Use   Smoking status: Never   Smokeless tobacco: Never  Vaping Use   Vaping Use: Never used  Substance and Sexual Activity   Alcohol use: No    Alcohol/week: 0.0 standard drinks of alcohol   Drug use: No   Sexual activity: Not Currently  Other Topics Concern   Not on file  Social History Narrative   Not on file  Social Determinants of Health   Financial Resource Strain: Low Risk  (12/18/2022)   Overall Financial Resource Strain (CARDIA)    Difficulty of Paying Living Expenses: Not very hard  Food Insecurity: No Food Insecurity (12/18/2022)   Hunger Vital Sign    Worried About Running Out of Food in the Last Year: Never true    Ran Out of Food in the Last Year: Never true  Transportation Needs: No Transportation Needs (12/18/2022)   PRAPARE - Hydrologist (Medical): No    Lack of Transportation (Non-Medical): No  Physical Activity: Not on file  Stress: Not on file  Social Connections: Not on file   Family History  Problem Relation Age of Onset   Diabetes Mellitus II Mother    Hypertension Mother    Heart block Mother    Hypertension Sister    Hypertension Sister    Colon cancer Neg Hx    Outpatient Encounter Medications as of  01/15/2023  Medication Sig   lanthanum (FOSRENOL) 500 MG chewable tablet Chew 1,000 mg by mouth 3 (three) times daily with meals.   acetaminophen (TYLENOL) 325 MG tablet Take 650 mg by mouth every 6 (six) hours as needed for mild pain or fever.   albuterol (PROVENTIL) (2.5 MG/3ML) 0.083% nebulizer solution Take 3 mLs (2.5 mg total) by nebulization every 6 (six) hours as needed for wheezing or shortness of breath.   albuterol (VENTOLIN HFA) 108 (90 Base) MCG/ACT inhaler Inhale 2 puffs into the lungs every 6 (six) hours as needed for wheezing or shortness of breath.   aspirin EC 81 MG tablet Take 1 tablet (81 mg total) by mouth daily. Swallow whole.   cetirizine (ZYRTEC) 10 MG tablet Take 10 mg by mouth daily.   Cholecalciferol (VITAMIN D3) 125 MCG (5000 UT) CAPS TAKE 1 CAPSULE BY MOUTH EVERY DAY   cinacalcet (SENSIPAR) 60 MG tablet Take 30 mg by mouth daily with breakfast.   dicyclomine (BENTYL) 10 MG capsule Take 1 capsule (10 mg total) by mouth 2 (two) times daily as needed. (Patient taking differently: Take 10 mg by mouth 2 (two) times daily as needed for spasms.)   docusate sodium (COLACE) 100 MG capsule Take 100 mg by mouth daily.   ferric citrate (AURYXIA) 1 GM 210 MG(Fe) tablet Take 2 tablets by mouth 2 (two) times daily with a meal. With Breakfast & with supper   glipiZIDE (GLUCOTROL) 10 MG tablet Take 10 mg by mouth 2 (two) times daily with a meal.   insulin degludec (TRESIBA FLEXTOUCH) 200 UNIT/ML FlexTouch Pen Inject 40 Units into the skin at bedtime.   ipratropium (ATROVENT) 0.03 % nasal spray Place 2 sprays into both nostrils 2 (two) times daily.    labetalol (NORMODYNE) 100 MG tablet Take 1.5 tablets (150 mg total) by mouth 2 (two) times daily. (Patient taking differently: Take 100 mg by mouth 2 (two) times daily.)   linaclotide (LINZESS) 290 MCG CAPS capsule Take 290 mcg by mouth daily before breakfast.   montelukast (SINGULAIR) 10 MG tablet TAKE 1 TABLET BY MOUTH EVERYDAY AT BEDTIME  (Patient taking differently: Take 10 mg by mouth at bedtime.)   multivitamin (RENA-VIT) TABS tablet Take 1 tablet by mouth daily.   nitroGLYCERIN (NITROSTAT) 0.4 MG SL tablet Place 1 tablet (0.4 mg total) under the tongue every 5 (five) minutes as needed for chest pain.   ondansetron (ZOFRAN-ODT) 4 MG disintegrating tablet Take 1 tablet (4 mg total) by mouth as needed for nausea  or vomiting.   pantoprazole (PROTONIX) 40 MG tablet TAKE 1 TABLET (40 MG TOTAL) BY MOUTH DAILY 30 MINUTES BEFORE BREAKFAST   rOPINIRole (REQUIP XL) 2 MG 24 hr tablet Take 2 mg by mouth at bedtime.    rosuvastatin (CRESTOR) 10 MG tablet TAKE 1 TABLET BY MOUTH EVERY DAY   sodium chloride (OCEAN) 0.65 % SOLN nasal spray Place 1 spray into both nostrils as needed for congestion.   SYMBICORT 160-4.5 MCG/ACT inhaler Inhale 2 puffs into the lungs daily.   [DISCONTINUED] insulin degludec (TRESIBA FLEXTOUCH) 200 UNIT/ML FlexTouch Pen Inject 20 Units into the skin at bedtime.   [DISCONTINUED] sevelamer carbonate (RENVELA) 800 MG tablet Take 800 mg by mouth See admin instructions. Take 2 tablets (1600 mg) in the morning, take 3 tablets (2400 mg) with lunch or snack,  and take 2 tablets (1600 mg) by mouth in the evening with dinner meal.   No facility-administered encounter medications on file as of 01/15/2023.   ALLERGIES: Allergies  Allergen Reactions   Amlodipine Besylate Rash and Other (See Comments)    dizziness   Reglan [Metoclopramide] Other (See Comments)    hallucinations     VACCINATION STATUS: Immunization History  Administered Date(s) Administered   Influenza Split 09/20/2015   Influenza,trivalent, recombinat, inj, PF 09/20/2015   Influenza-Unspecified 09/30/2014, 08/25/2015, 09/06/2016, 08/19/2017, 10/04/2017, 10/15/2018, 09/25/2019, 10/03/2020, 08/31/2022   Moderna SARS-COV2 Booster Vaccination 11/07/2021   Moderna Sars-Covid-2 Vaccination 03/02/2020, 03/30/2020, 05/02/2020, 12/16/2020   PPD Test 08/10/2015,  09/06/2015, 02/10/2016, 09/09/2017, 10/20/2018, 11/23/2019, 11/30/2020, 11/08/2021, 12/06/2021   Pneumococcal Polysaccharide-23 01/03/2022   Pneumococcal-Unspecified 03/02/2016, 02/20/2017   Unspecified SARS-COV-2 Vaccination 03/02/2020, 03/30/2020    HPI Aretta Frier is 52 y.o. female who presents today with a medical history as above. she is being seen in follow-up after she was seen in consultation for nodular goiter requested by Jake Samples, PA-C.   She is status post fine-needle aspiration biopsy with benign outcomes.  She has no new complaints about her thyroid today.  See notes from previous visit. She underwent thyroid ultrasound on January 04, 2022 which showed significant goiter with right lobe measuring from 1.1 cm and left lobe measuring 4.5 cm.  She was found to have 4.6 cm nodule in the right lobe.  She underwent fine-needle aspiration of the biopsy in March with benign outcomes.   Her recent thyroid function tests show mild subclinical hypothyroidism. She denies any prior history of thyroid dysfunction.  She denies dysphagia, shortness of breath, nor voice change.  She denies any family history of thyroid malignancy.  However, patient has personal history of breast cancer, status post lobectomy in 2019.  This visit is to address her diabetes. She returns with persistently elevated hyperglycemia averaging 249-259 for the last 14 days monitoring 16 times. No hypoglycemia is reported.  Her type 2 diabetes is complicated by ESRD on hemodialysis.  The cause of  ESRD is said to be hypertension, diabetes.    He did not bring any logs nor meter with her.  Her recent A1c was 9.5%.  She is currently taking glipizide 10 mg twice daily and Tresiba 20 units qhs. .  She is increasingly getting comfortable using insulin She  has advanced COPD/asthma. Patient is on multiple medications.  Not currently on steroids for  Review of Systems  Constitutional: + She reports fluctuating body  weight, + fatigue, no subjective hyperthermia, no subjective hypothermia   Objective:       01/15/2023   10:26 AM 01/01/2023   12:59  PM 12/27/2022    8:50 AM  Vitals with BMI  Height 5\' 3"  5\' 3"  5\' 3"   Weight 237 lbs 6 oz 240 lbs 10 oz 239 lbs  BMI 42.06 33.29 51.88  Systolic 416 606 301  Diastolic 82 62 82  Pulse 88 84 93    BP (!) 164/82   Pulse 88   Ht 5\' 3"  (1.6 m)   Wt 237 lb 6.4 oz (107.7 kg)   BMI 42.05 kg/m   Wt Readings from Last 3 Encounters:  01/15/23 237 lb 6.4 oz (107.7 kg)  01/01/23 240 lb 9.6 oz (109.1 kg)  12/27/22 239 lb (108.4 kg)    Physical Exam  Constitutional:  Body mass index is 42.05 kg/m.,  not in acute distress, normal state of mind Eyes: PERRLA, EOMI, no exophthalmos ENT: moist mucous membranes, + gross thyromegaly, no gross cervical lymphadenopathy   CMP ( most recent) CMP     Component Value Date/Time   NA 138 12/14/2022 0432   K 4.3 12/14/2022 0432   CL 97 (L) 12/14/2022 0432   CO2 25 12/14/2022 0432   GLUCOSE 223 (H) 12/14/2022 0432   BUN 70 (H) 12/14/2022 0432   CREATININE 10.85 (H) 12/14/2022 0432   CREATININE 5.93 (H) 06/29/2019 1210   CALCIUM 8.9 12/14/2022 0432   PROT 6.7 12/11/2022 0351   PROT 7.8 05/17/2022 1607   ALBUMIN 3.7 12/11/2022 0351   ALBUMIN 4.9 05/17/2022 1607   AST 24 12/11/2022 0351   ALT 48 (H) 12/11/2022 0351   ALKPHOS 103 12/11/2022 0351   BILITOT 0.7 12/11/2022 0351   BILITOT 0.4 05/17/2022 1607   GFRNONAA 4 (L) 12/14/2022 0432   GFRNONAA 8 (L) 06/29/2019 1210   GFRAA 4 (L) 08/04/2020 0452   GFRAA 9 (L) 06/29/2019 1210     Diabetic Labs (most recent): Lab Results  Component Value Date   HGBA1C 9.5 (H) 11/19/2022   HGBA1C 8.3 (H) 06/26/2021   HGBA1C 8.4 (H) 08/04/2020     Lipid Panel ( most recent) Lipid Panel     Component Value Date/Time   CHOL 185 05/17/2022 1607   TRIG 58 05/17/2022 1607   HDL 108 05/17/2022 1607   CHOLHDL 1.9 01/04/2022 1003   VLDL 10 01/04/2022 1003   LDLCALC  66 05/17/2022 1607   LABVLDL 11 05/17/2022 1607   Summary of her thyroid ultrasound from January 05, 2022: Right lobe 7.1 cm, 4.6 cm nodule Left lobe 4.5 cm.   Fine-needle aspiration biopsy on February 27, 2022: FINAL MICROSCOPIC DIAGNOSIS:  - Consistent with benign follicular nodule ([employer]hes[employer] category II)   SPECIMEN ADEQUACY:  Satisfactory for evaluation    Assessment & Plan:   1.  Type 2 diabetes-uncontrolled Complicated by ESRD currently on hemodialysis. Based on her continued hyperglycemic burden, she is advised to increase her Tresiba to 40 units qhs.  She is approached to continue  monitoring blood glucose at least twice a day-before breakfast and bedtime , call for hypoglycemia of less than 70 and persistent hyperglycemia > 200 mg /dl at fasting 3 days in a week.  In the meantime, she is advised to continue glipizide 10 mg p.o. twice daily-with breakfast and supper.  She is not a candidate for metformin.  She will be considered for GLP1 RA as appropriate on subsequent visits.  2. Nodular goiter I reviewed her labs with her. Her thyroid function tests are consistent with mild subclinical hyperthyroidism .  She will not need intervention with thyroid hormone nor antithyroid therapy  at this time. - she has nodular goiter which was worked up completely including with fine-needle aspiration with benign outcomes.  She will not need surgical intervention at this time.  She does not have any compressive concerns at this time.  Due to the sheer size of her thyroid which is asymmetrically enlarged with 7.1 cm right lobe, 4.5 cm left lobe , she will need surveillance thyroid ultrasound in 1 year.   This patient will need bone density in the next subsequent days.  - I did not initiate any new prescriptions today. She has several comorbid conditions including type 2 diabetes, hypertension, ESRD on hemodialysis, hyperlipidemia, COPD/asthma, hyperlipidemia.  - she is advised to maintain close  follow up with Jake Samples, PA-C for primary care needs.  I spent  26 minutes in the care of the patient today including review of labs from Thyroid Function, CMP, and other relevant labs ; reviewing her glucose data; face-to-face time discussing  her lab results and symptoms, medications doses, her options of short and [place] term treatment based on the latest standards of care / guidelines;   and documenting the encounter.  Dasia Crihfield  participated in the discussions, expressed understanding, and voiced agreement with the above plans.  All questions were answered to her satisfaction. she is encouraged to contact clinic should she have any questions or concerns prior to her return visit.   Follow up plan: Return in about 9 weeks (around 03/19/2023) for Bring Meter/CGM Device/Logs- A1c in Office.   Glade Lloyd, MD [facility] [address] [place], [place] [address] Phone: [phone]  Fax: [phone]     01/15/2023, 1:14 PM  This note was partially dictated with voice recognition software. Similar sounding words can be transcribed inadequately or may not  be corrected upon review.

## 2023-01-15 NOTE — Patient Instructions (Signed)

## 2023-01-15 NOTE — Assessment & Plan Note (Signed)
Encouraged to use her CPAP each night.  Patient education on sleep apnea given  Plan  Patient Instructions  Continue on Symbicort 2 puffs Twice daily, rinse after use.  Singulair 10mg  At bedtime   Albuterol inhaler As needed   Continue on CPAP At bedtime -wear all night [place] .  Work on healthy weight loss .  Continue on Oxygen 2l/ rest and needs 3L continuous with exertion  Follow up with Dr. Halford Chessman  in 3 months and As needed

## 2023-01-15 NOTE — Patient Instructions (Addendum)
Continue on Symbicort 2 puffs Twice daily, rinse after use.  Singulair 10mg  At bedtime   Albuterol inhaler As needed   Continue on CPAP At bedtime -wear all night [place] .  Work on healthy weight loss .  Continue on Oxygen 2l/ rest and needs 3L continuous with exertion  Follow up with Dr. Halford Chessman  in 3 months and As needed

## 2023-01-16 DIAGNOSIS — D631 Anemia in chronic kidney disease: Secondary | ICD-10-CM | POA: Diagnosis not present

## 2023-01-16 DIAGNOSIS — D509 Iron deficiency anemia, unspecified: Secondary | ICD-10-CM | POA: Diagnosis not present

## 2023-01-16 DIAGNOSIS — N2581 Secondary hyperparathyroidism of renal origin: Secondary | ICD-10-CM | POA: Diagnosis not present

## 2023-01-16 DIAGNOSIS — Z992 Dependence on renal dialysis: Secondary | ICD-10-CM | POA: Diagnosis not present

## 2023-01-16 DIAGNOSIS — N250 Renal osteodystrophy: Secondary | ICD-10-CM | POA: Diagnosis not present

## 2023-01-16 DIAGNOSIS — N186 End stage renal disease: Secondary | ICD-10-CM | POA: Diagnosis not present

## 2023-01-16 NOTE — Progress Notes (Signed)
Reviewed and agree with assessment/plan.   Chesley Mires, MD [facility] Pulmonary/Critical Care 01/16/2023, 9:20 AM Pager:  [phone]

## 2023-01-17 DIAGNOSIS — N2581 Secondary hyperparathyroidism of renal origin: Secondary | ICD-10-CM | POA: Diagnosis not present

## 2023-01-17 DIAGNOSIS — N250 Renal osteodystrophy: Secondary | ICD-10-CM | POA: Diagnosis not present

## 2023-01-17 DIAGNOSIS — I770 Arteriovenous fistula, acquired: Secondary | ICD-10-CM | POA: Diagnosis not present

## 2023-01-17 DIAGNOSIS — D509 Iron deficiency anemia, unspecified: Secondary | ICD-10-CM | POA: Diagnosis not present

## 2023-01-17 DIAGNOSIS — D0512 Intraductal carcinoma in situ of left breast: Secondary | ICD-10-CM | POA: Diagnosis not present

## 2023-01-17 DIAGNOSIS — D631 Anemia in chronic kidney disease: Secondary | ICD-10-CM | POA: Diagnosis not present

## 2023-01-17 DIAGNOSIS — Z992 Dependence on renal dialysis: Secondary | ICD-10-CM | POA: Diagnosis not present

## 2023-01-17 DIAGNOSIS — N186 End stage renal disease: Secondary | ICD-10-CM | POA: Diagnosis not present

## 2023-01-17 DIAGNOSIS — R2232 Localized swelling, mass and lump, left upper limb: Secondary | ICD-10-CM | POA: Diagnosis not present

## 2023-01-18 DIAGNOSIS — Z992 Dependence on renal dialysis: Secondary | ICD-10-CM | POA: Diagnosis not present

## 2023-01-18 DIAGNOSIS — N250 Renal osteodystrophy: Secondary | ICD-10-CM | POA: Diagnosis not present

## 2023-01-18 DIAGNOSIS — N186 End stage renal disease: Secondary | ICD-10-CM | POA: Diagnosis not present

## 2023-01-18 DIAGNOSIS — D509 Iron deficiency anemia, unspecified: Secondary | ICD-10-CM | POA: Diagnosis not present

## 2023-01-18 DIAGNOSIS — D631 Anemia in chronic kidney disease: Secondary | ICD-10-CM | POA: Diagnosis not present

## 2023-01-18 DIAGNOSIS — N2581 Secondary hyperparathyroidism of renal origin: Secondary | ICD-10-CM | POA: Diagnosis not present

## 2023-01-21 DIAGNOSIS — D509 Iron deficiency anemia, unspecified: Secondary | ICD-10-CM | POA: Diagnosis not present

## 2023-01-21 DIAGNOSIS — D631 Anemia in chronic kidney disease: Secondary | ICD-10-CM | POA: Diagnosis not present

## 2023-01-21 DIAGNOSIS — Z992 Dependence on renal dialysis: Secondary | ICD-10-CM | POA: Diagnosis not present

## 2023-01-21 DIAGNOSIS — N2581 Secondary hyperparathyroidism of renal origin: Secondary | ICD-10-CM | POA: Diagnosis not present

## 2023-01-21 DIAGNOSIS — N186 End stage renal disease: Secondary | ICD-10-CM | POA: Diagnosis not present

## 2023-01-21 DIAGNOSIS — N250 Renal osteodystrophy: Secondary | ICD-10-CM | POA: Diagnosis not present

## 2023-01-22 DIAGNOSIS — N6332 Unspecified lump in axillary tail of the left breast: Secondary | ICD-10-CM | POA: Diagnosis not present

## 2023-01-22 DIAGNOSIS — R2232 Localized swelling, mass and lump, left upper limb: Secondary | ICD-10-CM | POA: Diagnosis not present

## 2023-01-22 DIAGNOSIS — Z9012 Acquired absence of left breast and nipple: Secondary | ICD-10-CM | POA: Diagnosis not present

## 2023-01-23 DIAGNOSIS — N250 Renal osteodystrophy: Secondary | ICD-10-CM | POA: Diagnosis not present

## 2023-01-23 DIAGNOSIS — Z992 Dependence on renal dialysis: Secondary | ICD-10-CM | POA: Diagnosis not present

## 2023-01-23 DIAGNOSIS — N186 End stage renal disease: Secondary | ICD-10-CM | POA: Diagnosis not present

## 2023-01-23 DIAGNOSIS — D509 Iron deficiency anemia, unspecified: Secondary | ICD-10-CM | POA: Diagnosis not present

## 2023-01-23 DIAGNOSIS — D631 Anemia in chronic kidney disease: Secondary | ICD-10-CM | POA: Diagnosis not present

## 2023-01-23 DIAGNOSIS — N2581 Secondary hyperparathyroidism of renal origin: Secondary | ICD-10-CM | POA: Diagnosis not present

## 2023-01-24 ENCOUNTER — Ambulatory Visit: Payer: Self-pay | Admitting: *Deleted

## 2023-01-24 NOTE — Patient Outreach (Signed)
  Care Coordination   01/24/2023 Name: Patricia [name] MRN: [id number] DOB: 1971/04/19   Care Coordination Outreach Attempts:  An unsuccessful telephone outreach was attempted for a scheduled appointment today.  Follow Up Plan:  Additional outreach attempts will be made to offer the patient care coordination information and services.   Encounter Outcome:  No Answer. Left HIPAA compliant VM to return call with any acute needs. Otherwise will forward to care guide to reschedule telephone appointment.   Care Coordination Interventions:  No, not indicated Pulmonary and endocrinology office notes reviewed. Patient again did not take her meter or blood sugar logs to endo appt. Will stress importance of this at next visit. Tresiba increased to 40 units qhs and continuing glipizide 10mg  BID with meals. She was not a candidate for POC due to oxygen desaturation while walking with pulse. Will have to remain on continuous flow. 2L at rest and 3 with activity. Needs to wear CPAP all night.    Chong Sicilian, BSN, RN-BC RN Care Coordinator [place] Direct Dial: [phone] Main #: [phone]

## 2023-01-25 ENCOUNTER — Telehealth: Payer: Self-pay | Admitting: Pulmonary Disease

## 2023-01-25 DIAGNOSIS — N250 Renal osteodystrophy: Secondary | ICD-10-CM | POA: Diagnosis not present

## 2023-01-25 DIAGNOSIS — N2581 Secondary hyperparathyroidism of renal origin: Secondary | ICD-10-CM | POA: Diagnosis not present

## 2023-01-25 DIAGNOSIS — N186 End stage renal disease: Secondary | ICD-10-CM | POA: Diagnosis not present

## 2023-01-25 DIAGNOSIS — Z992 Dependence on renal dialysis: Secondary | ICD-10-CM | POA: Diagnosis not present

## 2023-01-25 DIAGNOSIS — D509 Iron deficiency anemia, unspecified: Secondary | ICD-10-CM | POA: Diagnosis not present

## 2023-01-25 DIAGNOSIS — D631 Anemia in chronic kidney disease: Secondary | ICD-10-CM | POA: Diagnosis not present

## 2023-01-25 NOTE — Telephone Encounter (Signed)
Received CPAP report, but it is from 2022.  Please have her DME send a report from the last 30 days.

## 2023-01-28 DIAGNOSIS — Z992 Dependence on renal dialysis: Secondary | ICD-10-CM | POA: Diagnosis not present

## 2023-01-28 DIAGNOSIS — N250 Renal osteodystrophy: Secondary | ICD-10-CM | POA: Diagnosis not present

## 2023-01-28 DIAGNOSIS — D509 Iron deficiency anemia, unspecified: Secondary | ICD-10-CM | POA: Diagnosis not present

## 2023-01-28 DIAGNOSIS — N2581 Secondary hyperparathyroidism of renal origin: Secondary | ICD-10-CM | POA: Diagnosis not present

## 2023-01-28 DIAGNOSIS — N186 End stage renal disease: Secondary | ICD-10-CM | POA: Diagnosis not present

## 2023-01-28 DIAGNOSIS — D631 Anemia in chronic kidney disease: Secondary | ICD-10-CM | POA: Diagnosis not present

## 2023-01-28 MED ORDER — CETIRIZINE HCL 10 MG PO TABS: 10.0000 mg | ORAL_TABLET | Freq: Every day | ORAL | 3 refills | Status: DC

## 2023-01-28 NOTE — Telephone Encounter (Signed)
Please let her know her CPAP report shows good control of her sleep apnea with current settings.  She needs to use for the entire time she is asleep to get maximum benefit.

## 2023-01-28 NOTE — Telephone Encounter (Signed)
Called and spoke to patient and she voiced understanding of recs from Dr. Halford Chessman.  She also asked for a refill on Zyrtec. Refill sent to [facility] in [place]. Nothing further needed

## 2023-01-30 DIAGNOSIS — Z992 Dependence on renal dialysis: Secondary | ICD-10-CM | POA: Diagnosis not present

## 2023-01-30 DIAGNOSIS — D509 Iron deficiency anemia, unspecified: Secondary | ICD-10-CM | POA: Diagnosis not present

## 2023-01-30 DIAGNOSIS — N250 Renal osteodystrophy: Secondary | ICD-10-CM | POA: Diagnosis not present

## 2023-01-30 DIAGNOSIS — N2581 Secondary hyperparathyroidism of renal origin: Secondary | ICD-10-CM | POA: Diagnosis not present

## 2023-01-30 DIAGNOSIS — N186 End stage renal disease: Secondary | ICD-10-CM | POA: Diagnosis not present

## 2023-01-30 DIAGNOSIS — D631 Anemia in chronic kidney disease: Secondary | ICD-10-CM | POA: Diagnosis not present

## 2023-02-01 DIAGNOSIS — N2581 Secondary hyperparathyroidism of renal origin: Secondary | ICD-10-CM | POA: Diagnosis not present

## 2023-02-01 DIAGNOSIS — N186 End stage renal disease: Secondary | ICD-10-CM | POA: Diagnosis not present

## 2023-02-01 DIAGNOSIS — N250 Renal osteodystrophy: Secondary | ICD-10-CM | POA: Diagnosis not present

## 2023-02-01 DIAGNOSIS — Z992 Dependence on renal dialysis: Secondary | ICD-10-CM | POA: Diagnosis not present

## 2023-02-01 DIAGNOSIS — D509 Iron deficiency anemia, unspecified: Secondary | ICD-10-CM | POA: Diagnosis not present

## 2023-02-01 DIAGNOSIS — D631 Anemia in chronic kidney disease: Secondary | ICD-10-CM | POA: Diagnosis not present

## 2023-02-04 DIAGNOSIS — N250 Renal osteodystrophy: Secondary | ICD-10-CM | POA: Diagnosis not present

## 2023-02-04 DIAGNOSIS — D509 Iron deficiency anemia, unspecified: Secondary | ICD-10-CM | POA: Diagnosis not present

## 2023-02-04 DIAGNOSIS — N2581 Secondary hyperparathyroidism of renal origin: Secondary | ICD-10-CM | POA: Diagnosis not present

## 2023-02-04 DIAGNOSIS — N186 End stage renal disease: Secondary | ICD-10-CM | POA: Diagnosis not present

## 2023-02-04 DIAGNOSIS — D631 Anemia in chronic kidney disease: Secondary | ICD-10-CM | POA: Diagnosis not present

## 2023-02-04 DIAGNOSIS — Z992 Dependence on renal dialysis: Secondary | ICD-10-CM | POA: Diagnosis not present

## 2023-02-05 ENCOUNTER — Telehealth: Payer: Self-pay | Admitting: *Deleted

## 2023-02-05 NOTE — Progress Notes (Signed)
  Care Coordination Note  02/05/2023 Name: Patricia [name] MRN: [id number] DOB: 11-Oct-1971  Patricia [name] is a 52 y.o. year old female who is a primary care patient of Scherrie Bateman and is actively engaged with the care management team. I reached out to [employer] by phone today to assist with re-scheduling a follow up visit with the RN Case Manager  Follow up plan: Unsuccessful telephone outreach attempt made. A HIPAA compliant phone message was left for the patient providing contact information and requesting a return call.  [place]  Direct Dial: [phone]

## 2023-02-06 DIAGNOSIS — N2581 Secondary hyperparathyroidism of renal origin: Secondary | ICD-10-CM | POA: Diagnosis not present

## 2023-02-06 DIAGNOSIS — D631 Anemia in chronic kidney disease: Secondary | ICD-10-CM | POA: Diagnosis not present

## 2023-02-06 DIAGNOSIS — Z992 Dependence on renal dialysis: Secondary | ICD-10-CM | POA: Diagnosis not present

## 2023-02-06 DIAGNOSIS — N186 End stage renal disease: Secondary | ICD-10-CM | POA: Diagnosis not present

## 2023-02-06 DIAGNOSIS — N250 Renal osteodystrophy: Secondary | ICD-10-CM | POA: Diagnosis not present

## 2023-02-06 DIAGNOSIS — D509 Iron deficiency anemia, unspecified: Secondary | ICD-10-CM | POA: Diagnosis not present

## 2023-02-08 DIAGNOSIS — Z992 Dependence on renal dialysis: Secondary | ICD-10-CM | POA: Diagnosis not present

## 2023-02-08 DIAGNOSIS — N2581 Secondary hyperparathyroidism of renal origin: Secondary | ICD-10-CM | POA: Diagnosis not present

## 2023-02-08 DIAGNOSIS — D631 Anemia in chronic kidney disease: Secondary | ICD-10-CM | POA: Diagnosis not present

## 2023-02-08 DIAGNOSIS — D509 Iron deficiency anemia, unspecified: Secondary | ICD-10-CM | POA: Diagnosis not present

## 2023-02-08 DIAGNOSIS — N250 Renal osteodystrophy: Secondary | ICD-10-CM | POA: Diagnosis not present

## 2023-02-08 DIAGNOSIS — N186 End stage renal disease: Secondary | ICD-10-CM | POA: Diagnosis not present

## 2023-02-11 ENCOUNTER — Telehealth: Payer: Self-pay

## 2023-02-11 DIAGNOSIS — Z992 Dependence on renal dialysis: Secondary | ICD-10-CM | POA: Diagnosis not present

## 2023-02-11 DIAGNOSIS — N250 Renal osteodystrophy: Secondary | ICD-10-CM | POA: Diagnosis not present

## 2023-02-11 DIAGNOSIS — N2581 Secondary hyperparathyroidism of renal origin: Secondary | ICD-10-CM | POA: Diagnosis not present

## 2023-02-11 DIAGNOSIS — E1122 Type 2 diabetes mellitus with diabetic chronic kidney disease: Secondary | ICD-10-CM

## 2023-02-11 DIAGNOSIS — D509 Iron deficiency anemia, unspecified: Secondary | ICD-10-CM | POA: Diagnosis not present

## 2023-02-11 DIAGNOSIS — N186 End stage renal disease: Secondary | ICD-10-CM | POA: Diagnosis not present

## 2023-02-11 DIAGNOSIS — D631 Anemia in chronic kidney disease: Secondary | ICD-10-CM | POA: Diagnosis not present

## 2023-02-11 MED ORDER — GLIPIZIDE 5 MG PO TABS: 5.0000 mg | ORAL_TABLET | Freq: Two times a day (BID) | ORAL | 0 refills | Status: DC

## 2023-02-11 MED ORDER — TRESIBA FLEXTOUCH 200 UNIT/ML ~~LOC~~ SOPN: 30.0000 [IU] | PEN_INJECTOR | Freq: Every day | SUBCUTANEOUS | 0 refills | Status: DC

## 2023-02-11 NOTE — Progress Notes (Signed)
  Care Coordination Note  02/11/2023 Name: Patricia [name] MRN: [id number] DOB: [birth year]  Patricia [name] is a 52 y.o. year old female who is a primary care patient of Scherrie Bateman and is actively engaged with the care management team. I reached out to [employer] by phone today to assist with re-scheduling a follow up visit with the RN Case Manager  Follow up plan: Telephone appointment with care management team member scheduled for:02/21/23 [place]: [phone]

## 2023-02-11 NOTE — Telephone Encounter (Signed)
Pt called stating her BG has been running low at times.   Date Before breakfast Before lunch Before supper Bedtime  02/24 73  85   02/25 85  88   02/26 71 was given candy at dialysis and checked BG 1 hour later which resulted 131             Pt taking: glipizide '10mg'$  bid and tresiba 40 units qhs.

## 2023-02-11 NOTE — Telephone Encounter (Signed)
Discussed with pt, understanding voiced. Rx's for tresiba 30 units qhs and glipizide '5mg'$  bid sent to [facility] on [address] per pt request.

## 2023-02-12 ENCOUNTER — Encounter: Payer: Self-pay | Admitting: Gastroenterology

## 2023-02-12 ENCOUNTER — Ambulatory Visit (INDEPENDENT_AMBULATORY_CARE_PROVIDER_SITE_OTHER): Payer: Medicare Other | Admitting: Gastroenterology

## 2023-02-12 VITALS — BP 144/82 | HR 93 | Temp 98.3°F | Ht 63.0 in | Wt 242.6 lb

## 2023-02-12 DIAGNOSIS — D649 Anemia, unspecified: Secondary | ICD-10-CM | POA: Diagnosis not present

## 2023-02-12 DIAGNOSIS — K5900 Constipation, unspecified: Secondary | ICD-10-CM | POA: Diagnosis not present

## 2023-02-12 DIAGNOSIS — R109 Unspecified abdominal pain: Secondary | ICD-10-CM | POA: Insufficient documentation

## 2023-02-12 DIAGNOSIS — R1084 Generalized abdominal pain: Secondary | ICD-10-CM | POA: Diagnosis not present

## 2023-02-12 MED ORDER — ONDANSETRON 4 MG PO TBDP: 4.0000 mg | ORAL_TABLET | ORAL | 3 refills | Status: DC | PRN

## 2023-02-12 NOTE — Patient Instructions (Addendum)
  We are arranging a CT scan in the near future!  We are also arranging a colonoscopy with Dr. Gala Romney. Make sure not to take iron for 7 days prior. No glucotrol the morning of the procedure, and only take 1/2 dose of insulin the evening prior  I have refilled Zofran.  We will see you in 3 months!   I enjoyed seeing you again today! At our first visit, I mentioned how I value our relationship and want to provide genuine, compassionate, and quality care. You may receive a survey regarding your visit with me, and I welcome your feedback! Thanks so much for taking the time to complete this. I look forward to seeing you again.   Annitta Needs, PhD, ANP-BC [facility] Gastroenterology

## 2023-02-12 NOTE — Progress Notes (Signed)
Gastroenterology Office Note     Primary Care Physician:  Scherrie Bateman  Primary Gastroenterologist: Dr. Gala Romney    Chief Complaint   Chief Complaint  Patient presents with   Follow-up    Pt is having burning in her stomach.      History of Present Illness   Patricia [name] is a 52 y.o. female presenting today in follow-up with medical history significant for ESRD on MWF HD, DM 2, HTN, OSA, chronic hypoxic respiratory failure on 2 L at home, obliterative bronchiolitis, chronic constipation and GERD, presenting in follow-up after hospital admission for worsening anemia in Dec 2023.   EGD completed and unrevealing. Consider colonoscopy as outpatient once optimized from pulmonary standpoint. Last colonoscopy 2017 with hemorrhoids.    Burning in upper abdomen pretty consistent. Not postprandial. Present since hospitalization. Gets nauseated intermittently. Sometimes vomiting. Will feel it coming on. Abdominal burning worsened with dialysis. Zofran helps with nausea. No rectal bleeding. Gallbladder absent. Dicyclomine helps with spasms during dialysis sessions. Linzess 290 mcg daily working well for constipation with stool softener. Pantoprazole daily for GERD.   2 liters nasal cannula oxygen.     Past Medical History:  Diagnosis Date   Anemia    Ankle fracture    Arthritis    Blood transfusion without reported diagnosis    Breast cancer ([place])    Cancer ([place])    Diabetes mellitus without complication ([place])    Dialysis patient ([place])    mon, wed, friday,    End stage renal disease on dialysis ([place])    M/W/F [facility] in [place]   GERD (gastroesophageal reflux disease)    Hypertension    OSA (obstructive sleep apnea)    uses CPAP sometimes   Pneumonia    PONV (postoperative nausea and vomiting)    Wears glasses     Past Surgical History:  Procedure Laterality Date   ABDOMINAL HYSTERECTOMY     AV FISTULA PLACEMENT  11/2014   at [place] N/A 04/2021   BALLOON DILATION N/A 07/10/2016   Procedure: BALLOON DILATION;  Surgeon: Danie Binder, MD;  [facility];  Service: Endoscopy;  Laterality: N/A;  Pyloric dilation   BREAST LUMPECTOMY     CESAREAN SECTION     CHOLECYSTECTOMY     COLONOSCOPY WITH PROPOFOL N/A 09/27/2016   Dr. Gala Romney: Internal hemorrhoids repeat colonoscopy in 10 years   DILATION AND CURETTAGE OF UTERUS     ESOPHAGOGASTRODUODENOSCOPY N/A 07/10/2016   Dr.Fields- normal esophagus, gastric stenosis was found at the pylorus, gastritis on bx, normal examined duodenun   ESOPHAGOGASTRODUODENOSCOPY (EGD) WITH PROPOFOL N/A 11/21/2022   Procedure: ESOPHAGOGASTRODUODENOSCOPY (EGD) WITH PROPOFOL;  Surgeon: Harvel Quale, MD;  [facility];  Service: Gastroenterology;  Laterality: N/A;   EXTERNAL FIXATION REMOVAL Right 10/29/2018   Procedure: REMOVAL RIGHT ANKLE BIOMET ZIMMER EXTERNAL FIXATOR, SHORT LEG CAST APPLICATION;  Surgeon: Marybelle Killings, MD;  Location: [facility];  Service: Orthopedics;  Laterality: Right;   MASTECTOMY     left sided   ORIF ANKLE FRACTURE Right 10/06/2018   Procedure: OPEN REDUCTION INTERNAL FIXATION (ORIF) RIGHT ANKLE TRIMALLEOLAR;  Surgeon: Marybelle Killings, MD;  Location: [facility];  Service: Orthopedics;  Laterality: Right;   RIGHT/LEFT HEART CATH AND CORONARY ANGIOGRAPHY N/A 11/27/2022   Procedure: RIGHT/LEFT HEART CATH AND CORONARY ANGIOGRAPHY;  Surgeon: Martinique, Peter M, MD;  Location: [facility];  Service: Cardiovascular;  Laterality:  N/A;    Current Outpatient Medications  Medication Sig Dispense Refill   acetaminophen (TYLENOL) 325 MG tablet Take 650 mg by mouth every 6 (six) hours as needed for mild pain or fever.     albuterol (PROVENTIL) (2.5 MG/3ML) 0.083% nebulizer solution Take 3 mLs (2.5 mg total) by nebulization every 6 (six) hours as needed for wheezing or shortness of breath. 75 mL 12   albuterol (VENTOLIN HFA) 108 (90 Base) MCG/ACT  inhaler Inhale 2 puffs into the lungs every 6 (six) hours as needed for wheezing or shortness of breath. 1 each 3   aspirin EC 81 MG tablet Take 1 tablet (81 mg total) by mouth daily. Swallow whole. 90 tablet 3   cetirizine (ZYRTEC) 10 MG tablet Take 1 tablet (10 mg total) by mouth daily. 30 tablet 3   Cholecalciferol (VITAMIN D3) 125 MCG (5000 UT) CAPS TAKE 1 CAPSULE BY MOUTH EVERY DAY 90 capsule 0   cinacalcet (SENSIPAR) 60 MG tablet Take 30 mg by mouth daily with breakfast.     dicyclomine (BENTYL) 10 MG capsule Take 1 capsule (10 mg total) by mouth 2 (two) times daily as needed. (Patient taking differently: Take 10 mg by mouth 2 (two) times daily as needed for spasms.) 60 capsule 5   docusate sodium (COLACE) 100 MG capsule Take 100 mg by mouth daily.     ferric citrate (AURYXIA) 1 GM 210 MG(Fe) tablet Take 2 tablets by mouth 2 (two) times daily with a meal. With Breakfast & with supper     glipiZIDE (GLUCOTROL) 5 MG tablet Take 1 tablet (5 mg total) by mouth 2 (two) times daily before a meal. 180 tablet 0   insulin degludec (TRESIBA FLEXTOUCH) 200 UNIT/ML FlexTouch Pen Inject 30 Units into the skin at bedtime. 15 mL 0   ipratropium (ATROVENT) 0.03 % nasal spray Place 2 sprays into both nostrils 2 (two) times daily.      irbesartan (AVAPRO) 75 MG tablet Take 75 mg by mouth daily.     labetalol (NORMODYNE) 100 MG tablet Take 1.5 tablets (150 mg total) by mouth 2 (two) times daily. (Patient taking differently: Take 100 mg by mouth 2 (two) times daily.) 90 tablet 2   lanthanum (FOSRENOL) 500 MG chewable tablet Chew 1,000 mg by mouth 3 (three) times daily with meals.     linaclotide (LINZESS) 290 MCG CAPS capsule Take 290 mcg by mouth daily before breakfast.     montelukast (SINGULAIR) 10 MG tablet TAKE 1 TABLET BY MOUTH EVERYDAY AT BEDTIME (Patient taking differently: Take 10 mg by mouth at bedtime.) 90 tablet 3   multivitamin (RENA-VIT) TABS tablet Take 1 tablet by mouth daily.     NIFEdipine  (ADALAT CC) 90 MG 24 hr tablet Take by mouth.     nitroGLYCERIN (NITROSTAT) 0.4 MG SL tablet Place 1 tablet (0.4 mg total) under the tongue every 5 (five) minutes as needed for chest pain. 100 tablet 3   ondansetron (ZOFRAN-ODT) 4 MG disintegrating tablet Take 1 tablet (4 mg total) by mouth as needed for nausea or vomiting. 30 tablet 0   pantoprazole (PROTONIX) 40 MG tablet TAKE 1 TABLET (40 MG TOTAL) BY MOUTH DAILY 30 MINUTES BEFORE BREAKFAST 90 tablet 3   rOPINIRole (REQUIP XL) 2 MG 24 hr tablet Take 2 mg by mouth at bedtime.      rosuvastatin (CRESTOR) 10 MG tablet TAKE 1 TABLET BY MOUTH EVERY DAY 90 tablet 1   sevelamer carbonate (RENVELA) 800 MG tablet Take by  mouth.     sodium chloride (OCEAN) 0.65 % SOLN nasal spray Place 1 spray into both nostrils as needed for congestion. 60 mL 0   SYMBICORT 160-4.5 MCG/ACT inhaler Inhale 2 puffs into the lungs daily.     No current facility-administered medications for this visit.    Allergies as of 02/12/2023 - Review Complete 02/12/2023  Allergen Reaction Noted   Amlodipine besylate Rash and Other (See Comments) 12/04/2015   Reglan [metoclopramide] Other (See Comments) 12/04/2015    Family History  Problem Relation Age of Onset   Diabetes Mellitus II Mother    Hypertension Mother    Heart block Mother    Hypertension Sister    Hypertension Sister    Colon cancer Neg Hx     Social History   Socioeconomic History   Marital status: Widowed    Spouse name: Not on file   Number of children: Not on file   Years of education: Not on file   Highest education level: Not on file  Occupational History   Not on file  Tobacco Use   Smoking status: Never   Smokeless tobacco: Never  Vaping Use   Vaping Use: Never used  Substance and Sexual Activity   Alcohol use: No    Alcohol/week: 0.0 standard drinks of alcohol   Drug use: No   Sexual activity: Not Currently  Other Topics Concern   Not on file  Social History Narrative   Not on file    Social Determinants of Health   Financial Resource Strain: Low Risk  (12/18/2022)   Overall Financial Resource Strain (CARDIA)    Difficulty of Paying Living Expenses: Not very hard  Food Insecurity: No Food Insecurity (12/18/2022)   Hunger Vital Sign    Worried About Running Out of Food in the Last Year: Never true    Ran Out of Food in the Last Year: Never true  Transportation Needs: No Transportation Needs (12/18/2022)   PRAPARE - Hydrologist (Medical): No    Lack of Transportation (Non-Medical): No  Physical Activity: Not on file  Stress: Not on file  Social Connections: Not on file  Intimate Partner Violence: Not At Risk (12/10/2022)   Humiliation, Afraid, Rape, and Kick questionnaire    Fear of Current or Ex-Partner: No    Emotionally Abused: No    Physically Abused: No    Sexually Abused: No     Review of Systems   Gen: Denies any fever, chills, fatigue, weight loss, lack of appetite.  CV: Denies chest pain, heart palpitations, peripheral edema, syncope.  Resp: Denies shortness of breath at rest or with exertion. Denies wheezing or cough.  GI: Denies dysphagia or odynophagia. Denies jaundice, hematemesis, fecal incontinence. GU : Denies urinary burning, urinary frequency, urinary hesitancy MS: Denies joint pain, muscle weakness, cramps, or limitation of movement.  Derm: Denies rash, itching, dry skin Psych: Denies depression, anxiety, memory loss, and confusion Heme: Denies bruising, bleeding, and enlarged lymph nodes.   Physical Exam   BP (!) 162/80 (BP Location: Right Arm, Patient Position: Sitting, Cuff Size: Large)   Pulse 93   Temp 98.3 F (36.8 C) (Oral)   Ht '5\' 3"'$  (1.6 m)   Wt 242 lb 9.6 oz (110 kg)   SpO2 98%   BMI 42.97 kg/m  General:   Alert and oriented. Pleasant and cooperative. Well-nourished and well-developed.  Head:  Normocephalic and atraumatic. Eyes:  Without icterus Cardiac: S1 S2 present Lung: clear  bilaterally Abdomen:  +BS, soft, mild TTP diffusely and non-distended. No HSM noted. No guarding or rebound. No masses appreciated.  Rectal:  Deferred  Msk:  Symmetrical without gross deformities. Normal posture. Extremities:  Without edema. Neurologic:  Alert and  oriented x4;  grossly normal neurologically. Skin:  Intact without significant lesions or rashes. Psych:  Alert and cooperative. Normal mood and affect.   Assessment   Patricia [name] is a 52 y.o. female presenting today in follow-up with medical history significant for ESRD on MWF HD, DM 2, HTN, OSA, chronic hypoxic respiratory failure on 2 L at home, obliterative bronchiolitis, chronic constipation and GERD, recently admitted with worsening anemia and concern for melena in Dec 2023.   Normocytic anemia: Hgb was 8.5 on admission, baseline around 9/10. EGD completed and unrevealing. Returns today to discuss colonoscopy. Last colonoscopy 2017 with hemorrhoids. No overt rectal bleeding. She states her Hgb has been drifting. We will request results from dialysis.   Abdominal pain: notes in upper abdomen. Sensation of burning. Denies postprandial pain. EGD unrevealing. Zofran helpful with nausea. Gallbladder absent. CT as previously planned.   Constipation: continue Linzess 290 mcg and stool softener.       PLAN   Proceed with colonoscopy by Dr. Gala Romney in near future: the risks, benefits, and alternatives have been discussed with the patient in detail. The patient states understanding and desires to proceed. Attempt to obtain most recent labs from [place] CT abd/pelvis without contrast Linzess 290 mcg daily 3 month return   Annitta Needs, PhD, [facility]

## 2023-02-12 NOTE — H&P (View-Only) (Signed)
     Gastroenterology Office Note     Primary Care Physician:  Jackson, Samantha J, PA-C  Primary Gastroenterologist: Dr. Rourk    Chief Complaint   Chief Complaint  Patient presents with   Follow-up    Pt is having burning in her stomach.      History of Present Illness   Deasha Haros is a 51 y.o. female presenting today in follow-up with medical history significant for ESRD on MWF HD, DM 2, HTN, OSA, chronic hypoxic respiratory failure on 2 L at home, obliterative bronchiolitis, chronic constipation and GERD, presenting in follow-up after hospital admission for worsening anemia in Dec 2023.   EGD completed and unrevealing. Consider colonoscopy as outpatient once optimized from pulmonary standpoint. Last colonoscopy 2017 with hemorrhoids.    Burning in upper abdomen pretty consistent. Not postprandial. Present since hospitalization. Gets nauseated intermittently. Sometimes vomiting. Will feel it coming on. Abdominal burning worsened with dialysis. Zofran helps with nausea. No rectal bleeding. Gallbladder absent. Dicyclomine helps with spasms during dialysis sessions. Linzess 290 mcg daily working well for constipation with stool softener. Pantoprazole daily for GERD.   2 liters nasal cannula oxygen.     Past Medical History:  Diagnosis Date   Anemia    Ankle fracture    Arthritis    Blood transfusion without reported diagnosis    Breast cancer ([facility])    Cancer ([facility])    Diabetes mellitus without complication ([facility])    Dialysis patient ([facility])    mon, wed, friday,    End stage renal disease on dialysis ([facility])    M/W/F [facility] in [place]   GERD (gastroesophageal reflux disease)    Hypertension    OSA (obstructive sleep apnea)    uses CPAP sometimes   Pneumonia    PONV (postoperative nausea and vomiting)    Wears glasses     Past Surgical History:  Procedure Laterality Date   ABDOMINAL HYSTERECTOMY     AV FISTULA PLACEMENT  11/2014   at [facility]   AV  FISTULA REPAIR N/A 04/2021   BALLOON DILATION N/A 07/10/2016   Procedure: BALLOON DILATION;  Surgeon: Sandi L Fields, MD;  [facility];  Service: Endoscopy;  Laterality: N/A;  Pyloric dilation   BREAST LUMPECTOMY     CESAREAN SECTION     CHOLECYSTECTOMY     COLONOSCOPY WITH PROPOFOL N/A 09/27/2016   Dr. Rourk: Internal hemorrhoids repeat colonoscopy in 10 years   DILATION AND CURETTAGE OF UTERUS     ESOPHAGOGASTRODUODENOSCOPY N/A 07/10/2016   Dr.Fields- normal esophagus, gastric stenosis was found at the pylorus, gastritis on bx, normal examined duodenun   ESOPHAGOGASTRODUODENOSCOPY (EGD) WITH PROPOFOL N/A 11/21/2022   Procedure: ESOPHAGOGASTRODUODENOSCOPY (EGD) WITH PROPOFOL;  Surgeon: Castaneda Mayorga, Daniel, MD;  [facility];  Service: Gastroenterology;  Laterality: N/A;   EXTERNAL FIXATION REMOVAL Right 10/29/2018   Procedure: REMOVAL RIGHT ANKLE BIOMET ZIMMER EXTERNAL FIXATOR, SHORT LEG CAST APPLICATION;  Surgeon: Yates, Mark C, MD;  [facility];  Service: Orthopedics;  Laterality: Right;   MASTECTOMY     left sided   ORIF ANKLE FRACTURE Right 10/06/2018   Procedure: OPEN REDUCTION INTERNAL FIXATION (ORIF) RIGHT ANKLE TRIMALLEOLAR;  Surgeon: Yates, Mark C, MD;  [facility];  Service: Orthopedics;  Laterality: Right;   RIGHT/LEFT HEART CATH AND CORONARY ANGIOGRAPHY N/A 11/27/2022   Procedure: RIGHT/LEFT HEART CATH AND CORONARY ANGIOGRAPHY;  Surgeon: Jordan, Peter M, MD;  [facility];  Service: Cardiovascular;  Laterality:   N/A;    Current Outpatient Medications  Medication Sig Dispense Refill   acetaminophen (TYLENOL) 325 MG tablet Take 650 mg by mouth every 6 (six) hours as needed for mild pain or fever.     albuterol (PROVENTIL) (2.5 MG/3ML) 0.083% nebulizer solution Take 3 mLs (2.5 mg total) by nebulization every 6 (six) hours as needed for wheezing or shortness of breath. 75 mL 12   albuterol (VENTOLIN HFA) 108 (90 Base) MCG/ACT  inhaler Inhale 2 puffs into the lungs every 6 (six) hours as needed for wheezing or shortness of breath. 1 each 3   aspirin EC 81 MG tablet Take 1 tablet (81 mg total) by mouth daily. Swallow whole. 90 tablet 3   cetirizine (ZYRTEC) 10 MG tablet Take 1 tablet (10 mg total) by mouth daily. 30 tablet 3   Cholecalciferol (VITAMIN D3) 125 MCG (5000 UT) CAPS TAKE 1 CAPSULE BY MOUTH EVERY DAY 90 capsule 0   cinacalcet (SENSIPAR) 60 MG tablet Take 30 mg by mouth daily with breakfast.     dicyclomine (BENTYL) 10 MG capsule Take 1 capsule (10 mg total) by mouth 2 (two) times daily as needed. (Patient taking differently: Take 10 mg by mouth 2 (two) times daily as needed for spasms.) 60 capsule 5   docusate sodium (COLACE) 100 MG capsule Take 100 mg by mouth daily.     ferric citrate (AURYXIA) 1 GM 210 MG(Fe) tablet Take 2 tablets by mouth 2 (two) times daily with a meal. With Breakfast & with supper     glipiZIDE (GLUCOTROL) 5 MG tablet Take 1 tablet (5 mg total) by mouth 2 (two) times daily before a meal. 180 tablet 0   insulin degludec (TRESIBA FLEXTOUCH) 200 UNIT/ML FlexTouch Pen Inject 30 Units into the skin at bedtime. 15 mL 0   ipratropium (ATROVENT) 0.03 % nasal spray Place 2 sprays into both nostrils 2 (two) times daily.      irbesartan (AVAPRO) 75 MG tablet Take 75 mg by mouth daily.     labetalol (NORMODYNE) 100 MG tablet Take 1.5 tablets (150 mg total) by mouth 2 (two) times daily. (Patient taking differently: Take 100 mg by mouth 2 (two) times daily.) 90 tablet 2   lanthanum (FOSRENOL) 500 MG chewable tablet Chew 1,000 mg by mouth 3 (three) times daily with meals.     linaclotide (LINZESS) 290 MCG CAPS capsule Take 290 mcg by mouth daily before breakfast.     montelukast (SINGULAIR) 10 MG tablet TAKE 1 TABLET BY MOUTH EVERYDAY AT BEDTIME (Patient taking differently: Take 10 mg by mouth at bedtime.) 90 tablet 3   multivitamin (RENA-VIT) TABS tablet Take 1 tablet by mouth daily.     NIFEdipine  (ADALAT CC) 90 MG 24 hr tablet Take by mouth.     nitroGLYCERIN (NITROSTAT) 0.4 MG SL tablet Place 1 tablet (0.4 mg total) under the tongue every 5 (five) minutes as needed for chest pain. 100 tablet 3   ondansetron (ZOFRAN-ODT) 4 MG disintegrating tablet Take 1 tablet (4 mg total) by mouth as needed for nausea or vomiting. 30 tablet 0   pantoprazole (PROTONIX) 40 MG tablet TAKE 1 TABLET (40 MG TOTAL) BY MOUTH DAILY 30 MINUTES BEFORE BREAKFAST 90 tablet 3   rOPINIRole (REQUIP XL) 2 MG 24 hr tablet Take 2 mg by mouth at bedtime.      rosuvastatin (CRESTOR) 10 MG tablet TAKE 1 TABLET BY MOUTH EVERY DAY 90 tablet 1   sevelamer carbonate (RENVELA) 800 MG tablet Take by   mouth.     sodium chloride (OCEAN) 0.65 % SOLN nasal spray Place 1 spray into both nostrils as needed for congestion. 60 mL 0   SYMBICORT 160-4.5 MCG/ACT inhaler Inhale 2 puffs into the lungs daily.     No current facility-administered medications for this visit.    Allergies as of 02/12/2023 - Review Complete 02/12/2023  Allergen Reaction Noted   Amlodipine besylate Rash and Other (See Comments) 12/04/2015   Reglan [metoclopramide] Other (See Comments) 12/04/2015    Family History  Problem Relation Age of Onset   Diabetes Mellitus II Mother    Hypertension Mother    Heart block Mother    Hypertension Sister    Hypertension Sister    Colon cancer Neg Hx     Social History   Socioeconomic History   Marital status: Widowed    Spouse name: Not on file   Number of children: Not on file   Years of education: Not on file   Highest education level: Not on file  Occupational History   Not on file  Tobacco Use   Smoking status: Never   Smokeless tobacco: Never  Vaping Use   Vaping Use: Never used  Substance and Sexual Activity   Alcohol use: No    Alcohol/week: 0.0 standard drinks of alcohol   Drug use: No   Sexual activity: Not Currently  Other Topics Concern   Not on file  Social History Narrative   Not on file    Social Determinants of Health   Financial Resource Strain: Low Risk  (12/18/2022)   Overall Financial Resource Strain (CARDIA)    Difficulty of Paying Living Expenses: Not very hard  Food Insecurity: No Food Insecurity (12/18/2022)   Hunger Vital Sign    Worried About Running Out of Food in the Last Year: Never true    Ran Out of Food in the Last Year: Never true  Transportation Needs: No Transportation Needs (12/18/2022)   PRAPARE - Transportation    Lack of Transportation (Medical): No    Lack of Transportation (Non-Medical): No  Physical Activity: Not on file  Stress: Not on file  Social Connections: Not on file  Intimate Partner Violence: Not At Risk (12/10/2022)   Humiliation, Afraid, Rape, and Kick questionnaire    Fear of Current or Ex-Partner: No    Emotionally Abused: No    Physically Abused: No    Sexually Abused: No     Review of Systems   Gen: Denies any fever, chills, fatigue, weight loss, lack of appetite.  CV: Denies chest pain, heart palpitations, peripheral edema, syncope.  Resp: Denies shortness of breath at rest or with exertion. Denies wheezing or cough.  GI: Denies dysphagia or odynophagia. Denies jaundice, hematemesis, fecal incontinence. GU : Denies urinary burning, urinary frequency, urinary hesitancy MS: Denies joint pain, muscle weakness, cramps, or limitation of movement.  Derm: Denies rash, itching, dry skin Psych: Denies depression, anxiety, memory loss, and confusion Heme: Denies bruising, bleeding, and enlarged lymph nodes.   Physical Exam   BP (!) 162/80 (BP Location: Right Arm, Patient Position: Sitting, Cuff Size: Large)   Pulse 93   Temp 98.3 F (36.8 C) (Oral)   Ht 5' 3" (1.6 m)   Wt 242 lb 9.6 oz (110 kg)   SpO2 98%   BMI 42.97 kg/m  General:   Alert and oriented. Pleasant and cooperative. Well-nourished and well-developed.  Head:  Normocephalic and atraumatic. Eyes:  Without icterus Cardiac: S1 S2 present Lung: clear    bilaterally Abdomen:  +BS, soft, mild TTP diffusely and non-distended. No HSM noted. No guarding or rebound. No masses appreciated.  Rectal:  Deferred  Msk:  Symmetrical without gross deformities. Normal posture. Extremities:  Without edema. Neurologic:  Alert and  oriented x4;  grossly normal neurologically. Skin:  Intact without significant lesions or rashes. Psych:  Alert and cooperative. Normal mood and affect.   Assessment   Patricia [name] is a 51 y.o. female presenting today in follow-up with medical history significant for ESRD on MWF HD, DM 2, HTN, OSA, chronic hypoxic respiratory failure on 2 L at home, obliterative bronchiolitis, chronic constipation and GERD, recently admitted with worsening anemia and concern for melena in Dec 2023.   Normocytic anemia: Hgb was 8.5 on admission, baseline around 9/10. EGD completed and unrevealing. Returns today to discuss colonoscopy. Last colonoscopy 2017 with hemorrhoids. No overt rectal bleeding. She states her Hgb has been drifting. We will request results from dialysis.   Abdominal pain: notes in upper abdomen. Sensation of burning. Denies postprandial pain. EGD unrevealing. Zofran helpful with nausea. Gallbladder absent. CT as previously planned.   Constipation: continue Linzess 290 mcg and stool softener.       PLAN   Proceed with colonoscopy by Dr. Rourk in near future: the risks, benefits, and alternatives have been discussed with the patient in detail. The patient states understanding and desires to proceed. Attempt to obtain most recent labs from [facility] CT abd/pelvis without contrast Linzess 290 mcg daily 3 month return   Aylan Bayona W. Elizabelle Fite, PhD, ANP-BC [facility]       

## 2023-02-13 ENCOUNTER — Encounter: Payer: Self-pay | Admitting: *Deleted

## 2023-02-13 ENCOUNTER — Other Ambulatory Visit: Payer: Self-pay | Admitting: *Deleted

## 2023-02-13 ENCOUNTER — Telehealth: Payer: Self-pay | Admitting: *Deleted

## 2023-02-13 DIAGNOSIS — D631 Anemia in chronic kidney disease: Secondary | ICD-10-CM | POA: Diagnosis not present

## 2023-02-13 DIAGNOSIS — N186 End stage renal disease: Secondary | ICD-10-CM | POA: Diagnosis not present

## 2023-02-13 DIAGNOSIS — N250 Renal osteodystrophy: Secondary | ICD-10-CM | POA: Diagnosis not present

## 2023-02-13 DIAGNOSIS — D509 Iron deficiency anemia, unspecified: Secondary | ICD-10-CM | POA: Diagnosis not present

## 2023-02-13 DIAGNOSIS — N2581 Secondary hyperparathyroidism of renal origin: Secondary | ICD-10-CM | POA: Diagnosis not present

## 2023-02-13 DIAGNOSIS — Z992 Dependence on renal dialysis: Secondary | ICD-10-CM | POA: Diagnosis not present

## 2023-02-13 MED ORDER — PEG 3350-KCL-NA BICARB-NACL 420 G PO SOLR: 4000.0000 mL | Freq: Once | ORAL | 0 refills | Status: AC

## 2023-02-13 NOTE — Telephone Encounter (Signed)
[facility]  [facility]CS w/Dr.Rourk  CT A/P w/o contrast-need to know if willing to go to another facility

## 2023-02-14 DIAGNOSIS — Z992 Dependence on renal dialysis: Secondary | ICD-10-CM | POA: Diagnosis not present

## 2023-02-14 DIAGNOSIS — N186 End stage renal disease: Secondary | ICD-10-CM | POA: Diagnosis not present

## 2023-02-15 DIAGNOSIS — N250 Renal osteodystrophy: Secondary | ICD-10-CM | POA: Diagnosis not present

## 2023-02-15 DIAGNOSIS — Z992 Dependence on renal dialysis: Secondary | ICD-10-CM | POA: Diagnosis not present

## 2023-02-15 DIAGNOSIS — D631 Anemia in chronic kidney disease: Secondary | ICD-10-CM | POA: Diagnosis not present

## 2023-02-15 DIAGNOSIS — N2581 Secondary hyperparathyroidism of renal origin: Secondary | ICD-10-CM | POA: Diagnosis not present

## 2023-02-15 DIAGNOSIS — N186 End stage renal disease: Secondary | ICD-10-CM | POA: Diagnosis not present

## 2023-02-15 DIAGNOSIS — E041 Nontoxic single thyroid nodule: Secondary | ICD-10-CM | POA: Diagnosis not present

## 2023-02-15 DIAGNOSIS — D509 Iron deficiency anemia, unspecified: Secondary | ICD-10-CM | POA: Diagnosis not present

## 2023-02-15 DIAGNOSIS — Z0000 Encounter for general adult medical examination without abnormal findings: Secondary | ICD-10-CM | POA: Diagnosis not present

## 2023-02-15 DIAGNOSIS — E1165 Type 2 diabetes mellitus with hyperglycemia: Secondary | ICD-10-CM | POA: Diagnosis not present

## 2023-02-15 HISTORY — PX: COLONOSCOPY: SHX174

## 2023-02-16 DIAGNOSIS — N186 End stage renal disease: Secondary | ICD-10-CM | POA: Diagnosis not present

## 2023-02-16 DIAGNOSIS — N250 Renal osteodystrophy: Secondary | ICD-10-CM | POA: Diagnosis not present

## 2023-02-16 DIAGNOSIS — D509 Iron deficiency anemia, unspecified: Secondary | ICD-10-CM | POA: Diagnosis not present

## 2023-02-16 DIAGNOSIS — N2581 Secondary hyperparathyroidism of renal origin: Secondary | ICD-10-CM | POA: Diagnosis not present

## 2023-02-16 DIAGNOSIS — D631 Anemia in chronic kidney disease: Secondary | ICD-10-CM | POA: Diagnosis not present

## 2023-02-16 DIAGNOSIS — Z992 Dependence on renal dialysis: Secondary | ICD-10-CM | POA: Diagnosis not present

## 2023-02-18 ENCOUNTER — Telehealth: Payer: Self-pay | Admitting: *Deleted

## 2023-02-18 DIAGNOSIS — D631 Anemia in chronic kidney disease: Secondary | ICD-10-CM | POA: Diagnosis not present

## 2023-02-18 DIAGNOSIS — N186 End stage renal disease: Secondary | ICD-10-CM | POA: Diagnosis not present

## 2023-02-18 DIAGNOSIS — N250 Renal osteodystrophy: Secondary | ICD-10-CM | POA: Diagnosis not present

## 2023-02-18 DIAGNOSIS — D509 Iron deficiency anemia, unspecified: Secondary | ICD-10-CM | POA: Diagnosis not present

## 2023-02-18 DIAGNOSIS — N2581 Secondary hyperparathyroidism of renal origin: Secondary | ICD-10-CM | POA: Diagnosis not present

## 2023-02-18 DIAGNOSIS — Z992 Dependence on renal dialysis: Secondary | ICD-10-CM | POA: Diagnosis not present

## 2023-02-18 NOTE — Telephone Encounter (Signed)
Received letter from [facility] stating they need specific directions on how the pt is to take Ondansetron '4mg'$  .

## 2023-02-19 MED ORDER — ONDANSETRON 4 MG PO TBDP: 4.0000 mg | ORAL_TABLET | Freq: Three times a day (TID) | ORAL | 3 refills | Status: AC | PRN

## 2023-02-19 NOTE — Addendum Note (Signed)
Addended by: Annitta Needs on: 02/19/2023 12:06 PM   Modules accepted: Orders

## 2023-02-19 NOTE — Telephone Encounter (Signed)
I am sorry! I resent. Zofran every 8 hours prn nausea/vomiting.

## 2023-02-19 NOTE — Telephone Encounter (Signed)
Noted! Thank you

## 2023-02-20 DIAGNOSIS — Z992 Dependence on renal dialysis: Secondary | ICD-10-CM | POA: Diagnosis not present

## 2023-02-20 DIAGNOSIS — D631 Anemia in chronic kidney disease: Secondary | ICD-10-CM | POA: Diagnosis not present

## 2023-02-20 DIAGNOSIS — N250 Renal osteodystrophy: Secondary | ICD-10-CM | POA: Diagnosis not present

## 2023-02-20 DIAGNOSIS — N186 End stage renal disease: Secondary | ICD-10-CM | POA: Diagnosis not present

## 2023-02-20 DIAGNOSIS — N2581 Secondary hyperparathyroidism of renal origin: Secondary | ICD-10-CM | POA: Diagnosis not present

## 2023-02-20 DIAGNOSIS — D509 Iron deficiency anemia, unspecified: Secondary | ICD-10-CM | POA: Diagnosis not present

## 2023-02-21 ENCOUNTER — Encounter: Payer: Self-pay | Admitting: *Deleted

## 2023-02-21 ENCOUNTER — Ambulatory Visit: Payer: Self-pay | Admitting: *Deleted

## 2023-02-21 NOTE — Patient Outreach (Signed)
Care Coordination   Follow Up Visit Note   02/21/2023 Name: Patricia [name] MRN: [id number] DOB: 08/19/1971  Patricia [name] is a 52 y.o. year old female who sees Patricia [name], Patricia [name] for primary care. I spoke with  Patricia [name] by phone today.  What matters to the patients health and wellness today?  Blood sugar management, oxygen needs, having colonoscopy and CT scan    Goals Addressed             This Visit's Progress    COMPLETED: Care Coordination Services       Care Coordination Interventions: Outreached by Patricia [name], Patricia [name], Certified Health Coach with [place] (Renal [name]) via email regarding recent hospitalization and outpatient management Provided Patricia [name] with update on patient status via email per my last conversation with patient on 01/03/23 Patient's primary concern and the biggest hurdle right now seems to be managing her blood sugar. She's reporting home readings in the 400-500 range and there isn't a clear cause She is following dietary recommendations and has cut out sugars, simple carbs, and artificial sweeteners She has been off of steroids for > 3 weeks and blood sugar is higher now than it was then Patient questioned if her meter is accurate. Considered purchasing a new one.   Reminded to take meter and blood sugar log to next visit with Patricia [name] on 01/15/23. He is considering insulin therapy. She would like to avoid that, but will try it with the hopes of coming off of it in the future Recommended that she compare her meter for accuracy to the in office reading at appointment on the 30th Plan to discuss Patricia [name] during our Patricia [name] on 01/10/23 Encouraged Patricia [name] to reach out with any additional questions or care coordination opportunities     Manage Blood Sugar       Care Coordination Goals: Patient will follow-up with PCP and/or endocrinologist every 3 months or as recommended Patient will take medication as  prescribed and reach out to provider with any negative side effects Patient will continue to monitor and record blood sugar 3 times per day and as needed and will call PCP or endocrinologist with any readings outside of recommended range Patient will take blood sugar log and meter to provider visits for review Patient will follow a modified carbohydrate diet and decrease simple carbohydrates and sugars Patient will increase activity level as tolerated with an ultimate goal of at least 150 minutes of exercise per week Patient will check feet daily for sores, wounds, calluses, etc and will notify provider of any abnormal findings Patient will have yearly eye exams to check for or monitor diabetic retinopathy Patient will reach out to Patricia [name] [phone] with any care coordination or resource needs      Manage Respiratory Conditions       Care Coordination Goals: Patient will continue to use O2 at 2L via nasal canula when at rest and at 3L via nasal canula with activity Patient will continue to use CPAP at night for sleep apnea Patient will keep all follow-up appointments with PCP and specialists Patient will monitor oxygen saturation levels and reach out to provider with any readings outside of recommended range Patient will reach out to [facility] with any questions or concerns regarding oxygen tanks, concentrator, or CPAP machine Patient will state understanding of why she doesn't qualify for a portable oxygen concentrator at this time but will f/u with provider and Patricia  [name] to see if she qualifies in the future Patient will call RN Care [name] [phone] with any care coordination or resource needs     COMPLETED: Obtain Patricia       Care Coordination Interventions: Advised patient to discuss need for new breast prosthesis with her oncologist at upcoming appointment. They can give her an order to take to a Patricia [name] Patricia [name] that provides these prosthetics.  Collaborated  with Patricia [name] Clinical Pool regarding desire for lightweight portable oxygen concentrator. Provided patient with education on "walk test" that is needed to qualify for Patricia [name] coverage of POC. Could be done at next appt. Currently uses Patricia [name]. Order will have to be sent there. Provided education on how Patricia [name] contracts with an oxygen Patricia supplier for 5 year periods.  Discussed plans with patient for ongoing care management follow up and provided patient with direct contact information for care management team Assessed social determinant of health barriers Encouraged to reach out to [facility] as needed        SDOH assessments and interventions completed:  Yes  SDOH Interventions Today    Flowsheet Row Most Recent Value  SDOH Interventions   Transportation Interventions Intervention Not Indicated  Financial Strain Interventions Intervention Not Indicated        Care Coordination Interventions:  Yes, provided  Interventions Today    Flowsheet Row Most Recent Value  Chronic Disease   Chronic disease during today's visit Diabetes, Other  [Chronic Respiratory Failure with Hypoxia, Sleep Apnea]  General Interventions   General Interventions Discussed/Reviewed General Interventions Discussed, General Interventions Reviewed, Labs, Annual Foot Exam, Annual Eye Exam, Durable Medical Equipment (Patricia), Doctor Visits  Labs Hgb A1c every 3 months, Kidney Function  Doctor Visits Discussed/Reviewed Doctor Visits Discussed, Doctor Visits Reviewed, PCP, Specialist  [reviewed upcoming appt for CT imaging at Patricia [name] and Colonoscopy at [facility]. Verified transportation.]  Durable Medical Equipment (Patricia) Glucomoter, Oxygen, Other  [CPAP]  PCP/Specialist Visits Compliance with follow-up visit  Exercise Interventions   Exercise Discussed/Reviewed Physical Activity  Physical Activity Discussed/Reviewed Physical Activity Discussed  Education Interventions   Education Provided Provided Education   Provided Verbal Education On Eye Care, Foot Care, Nutrition, Blood Sugar Monitoring, Medication, When to see the doctor, Mental Health/Coping with Illness  Nutrition Interventions   Nutrition Discussed/Reviewed Nutrition Discussed, Carbohydrate meal planning  Pharmacy Interventions   Pharmacy Dicussed/Reviewed Medications and their functions       Follow up plan: Follow up call scheduled for 03/26/23    Encounter Outcome:  Pt. Visit Completed   Chong Sicilian, BSN, RN-BC RN Care [name] [place]: [phone] Main #: [phone]

## 2023-02-22 DIAGNOSIS — N186 End stage renal disease: Secondary | ICD-10-CM | POA: Diagnosis not present

## 2023-02-22 DIAGNOSIS — D509 Iron deficiency anemia, unspecified: Secondary | ICD-10-CM | POA: Diagnosis not present

## 2023-02-22 DIAGNOSIS — D631 Anemia in chronic kidney disease: Secondary | ICD-10-CM | POA: Diagnosis not present

## 2023-02-22 DIAGNOSIS — Z992 Dependence on renal dialysis: Secondary | ICD-10-CM | POA: Diagnosis not present

## 2023-02-22 DIAGNOSIS — N2581 Secondary hyperparathyroidism of renal origin: Secondary | ICD-10-CM | POA: Diagnosis not present

## 2023-02-22 DIAGNOSIS — N250 Renal osteodystrophy: Secondary | ICD-10-CM | POA: Diagnosis not present

## 2023-02-24 ENCOUNTER — Ambulatory Visit (HOSPITAL_BASED_OUTPATIENT_CLINIC_OR_DEPARTMENT_OTHER)
Admission: RE | Admit: 2023-02-24 | Discharge: 2023-02-24 | Disposition: A | Discharge status: Home / Self Care | Payer: Medicare Other | Source: Ambulatory Visit | Attending: Gastroenterology | Admitting: Gastroenterology

## 2023-02-24 DIAGNOSIS — R109 Unspecified abdominal pain: Secondary | ICD-10-CM | POA: Diagnosis not present

## 2023-02-24 DIAGNOSIS — R1084 Generalized abdominal pain: Secondary | ICD-10-CM | POA: Diagnosis not present

## 2023-02-25 ENCOUNTER — Telehealth: Payer: Self-pay | Admitting: *Deleted

## 2023-02-25 DIAGNOSIS — Z992 Dependence on renal dialysis: Secondary | ICD-10-CM | POA: Diagnosis not present

## 2023-02-25 DIAGNOSIS — D509 Iron deficiency anemia, unspecified: Secondary | ICD-10-CM | POA: Diagnosis not present

## 2023-02-25 DIAGNOSIS — N250 Renal osteodystrophy: Secondary | ICD-10-CM | POA: Diagnosis not present

## 2023-02-25 DIAGNOSIS — D631 Anemia in chronic kidney disease: Secondary | ICD-10-CM | POA: Diagnosis not present

## 2023-02-25 DIAGNOSIS — N186 End stage renal disease: Secondary | ICD-10-CM | POA: Diagnosis not present

## 2023-02-25 DIAGNOSIS — N2581 Secondary hyperparathyroidism of renal origin: Secondary | ICD-10-CM | POA: Diagnosis not present

## 2023-02-25 NOTE — Telephone Encounter (Signed)
Pt says her nurse from dialysis told her that the [place] that she takes has iron in it. She has not stopped the medication and her procedure is scheduled for 02/28/23. Is she ok to continue with the procedure or does she need to reschedule?  Please advise. Thank you

## 2023-02-25 NOTE — Telephone Encounter (Signed)
Pt informed and verbalized understanding

## 2023-02-25 NOTE — Telephone Encounter (Signed)
Go ahead and stop it now. Should be ok.

## 2023-02-26 ENCOUNTER — Encounter (HOSPITAL_COMMUNITY)
Admission: RE | Admit: 2023-02-26 | Discharge: 2023-02-26 | Disposition: A | Discharge status: Home / Self Care | Payer: Medicare Other | Source: Ambulatory Visit | Attending: Internal Medicine | Admitting: Internal Medicine

## 2023-02-26 ENCOUNTER — Encounter (HOSPITAL_COMMUNITY): Payer: Self-pay

## 2023-02-26 DIAGNOSIS — N186 End stage renal disease: Secondary | ICD-10-CM

## 2023-02-27 DIAGNOSIS — D509 Iron deficiency anemia, unspecified: Secondary | ICD-10-CM | POA: Diagnosis not present

## 2023-02-27 DIAGNOSIS — N186 End stage renal disease: Secondary | ICD-10-CM | POA: Diagnosis not present

## 2023-02-27 DIAGNOSIS — N250 Renal osteodystrophy: Secondary | ICD-10-CM | POA: Diagnosis not present

## 2023-02-27 DIAGNOSIS — Z992 Dependence on renal dialysis: Secondary | ICD-10-CM | POA: Diagnosis not present

## 2023-02-27 DIAGNOSIS — D631 Anemia in chronic kidney disease: Secondary | ICD-10-CM | POA: Diagnosis not present

## 2023-02-27 DIAGNOSIS — N2581 Secondary hyperparathyroidism of renal origin: Secondary | ICD-10-CM | POA: Diagnosis not present

## 2023-02-28 ENCOUNTER — Encounter (HOSPITAL_COMMUNITY)
Admission: RE | Disposition: A | Discharge status: Home / Self Care | Payer: Self-pay | Source: Ambulatory Visit | Attending: Internal Medicine

## 2023-02-28 ENCOUNTER — Ambulatory Visit (HOSPITAL_COMMUNITY)
Admission: RE | Admit: 2023-02-28 | Discharge: 2023-02-28 | Disposition: A | Discharge status: Home / Self Care | Payer: Medicare Other | Source: Ambulatory Visit | Attending: Internal Medicine | Admitting: Internal Medicine

## 2023-02-28 ENCOUNTER — Encounter (HOSPITAL_COMMUNITY): Payer: Self-pay | Admitting: Internal Medicine

## 2023-02-28 ENCOUNTER — Ambulatory Visit (HOSPITAL_BASED_OUTPATIENT_CLINIC_OR_DEPARTMENT_OTHER): Payer: Medicare Other | Admitting: Certified Registered"

## 2023-02-28 ENCOUNTER — Ambulatory Visit (HOSPITAL_COMMUNITY): Payer: Medicare Other | Admitting: Certified Registered"

## 2023-02-28 DIAGNOSIS — E1122 Type 2 diabetes mellitus with diabetic chronic kidney disease: Secondary | ICD-10-CM | POA: Insufficient documentation

## 2023-02-28 DIAGNOSIS — Z7984 Long term (current) use of oral hypoglycemic drugs: Secondary | ICD-10-CM | POA: Insufficient documentation

## 2023-02-28 DIAGNOSIS — D631 Anemia in chronic kidney disease: Secondary | ICD-10-CM

## 2023-02-28 DIAGNOSIS — N186 End stage renal disease: Secondary | ICD-10-CM | POA: Diagnosis not present

## 2023-02-28 DIAGNOSIS — Z8249 Family history of ischemic heart disease and other diseases of the circulatory system: Secondary | ICD-10-CM | POA: Diagnosis not present

## 2023-02-28 DIAGNOSIS — G4730 Sleep apnea, unspecified: Secondary | ICD-10-CM | POA: Diagnosis not present

## 2023-02-28 DIAGNOSIS — Q438 Other specified congenital malformations of intestine: Secondary | ICD-10-CM | POA: Insufficient documentation

## 2023-02-28 DIAGNOSIS — I132 Hypertensive heart and chronic kidney disease with heart failure and with stage 5 chronic kidney disease, or end stage renal disease: Secondary | ICD-10-CM

## 2023-02-28 DIAGNOSIS — K6289 Other specified diseases of anus and rectum: Secondary | ICD-10-CM | POA: Diagnosis not present

## 2023-02-28 DIAGNOSIS — Z794 Long term (current) use of insulin: Secondary | ICD-10-CM | POA: Insufficient documentation

## 2023-02-28 DIAGNOSIS — K219 Gastro-esophageal reflux disease without esophagitis: Secondary | ICD-10-CM | POA: Insufficient documentation

## 2023-02-28 DIAGNOSIS — Z992 Dependence on renal dialysis: Secondary | ICD-10-CM | POA: Insufficient documentation

## 2023-02-28 DIAGNOSIS — D509 Iron deficiency anemia, unspecified: Secondary | ICD-10-CM | POA: Insufficient documentation

## 2023-02-28 DIAGNOSIS — Z833 Family history of diabetes mellitus: Secondary | ICD-10-CM | POA: Diagnosis not present

## 2023-02-28 DIAGNOSIS — I509 Heart failure, unspecified: Secondary | ICD-10-CM

## 2023-02-28 DIAGNOSIS — G4733 Obstructive sleep apnea (adult) (pediatric): Secondary | ICD-10-CM | POA: Diagnosis not present

## 2023-02-28 DIAGNOSIS — I120 Hypertensive chronic kidney disease with stage 5 chronic kidney disease or end stage renal disease: Secondary | ICD-10-CM | POA: Diagnosis not present

## 2023-02-28 HISTORY — PX: COLONOSCOPY WITH PROPOFOL: SHX5780

## 2023-02-28 LAB — CBC WITH DIFFERENTIAL/PLATELET
Abs Immature Granulocytes: 0.03 10*3/uL (ref 0.00–0.07)
Basophils Absolute: 0.1 10*3/uL (ref 0.0–0.1)
Basophils Relative: 1 %
Eosinophils Absolute: 0.1 10*3/uL (ref 0.0–0.5)
Eosinophils Relative: 1 %
HCT: 36.9 % (ref 36.0–46.0)
Hemoglobin: 11.4 g/dL — ABNORMAL LOW (ref 12.0–15.0)
Immature Granulocytes: 0 %
Lymphocytes Relative: 12 %
Lymphs Abs: 1.3 10*3/uL (ref 0.7–4.0)
MCH: 28.6 pg (ref 26.0–34.0)
MCHC: 30.9 g/dL (ref 30.0–36.0)
MCV: 92.7 fL (ref 80.0–100.0)
Monocytes Absolute: 0.6 10*3/uL (ref 0.1–1.0)
Monocytes Relative: 6 %
Neutro Abs: 8.4 10*3/uL — ABNORMAL HIGH (ref 1.7–7.7)
Neutrophils Relative %: 80 %
Platelets: 263 10*3/uL (ref 150–400)
RBC: 3.98 MIL/uL (ref 3.87–5.11)
RDW: 13.8 % (ref 11.5–15.5)
WBC: 10.5 10*3/uL (ref 4.0–10.5)
nRBC: 0 % (ref 0.0–0.2)

## 2023-02-28 LAB — BASIC METABOLIC PANEL
Anion gap: 18 — ABNORMAL HIGH (ref 5–15)
BUN: 34 mg/dL — ABNORMAL HIGH (ref 6–20)
CO2: 23 mmol/L (ref 22–32)
Calcium: 9.5 mg/dL (ref 8.9–10.3)
Chloride: 95 mmol/L — ABNORMAL LOW (ref 98–111)
Creatinine, Ser: 8.55 mg/dL — ABNORMAL HIGH (ref 0.44–1.00)
GFR, Estimated: 5 mL/min — ABNORMAL LOW (ref >60)
Glucose, Bld: 100 mg/dL — ABNORMAL HIGH (ref 70–99)
Potassium: 4.5 mmol/L (ref 3.5–5.1)
Sodium: 136 mmol/L (ref 135–145)

## 2023-02-28 SURGERY — COLONOSCOPY WITH PROPOFOL
Anesthesia: General

## 2023-02-28 MED ORDER — STERILE WATER FOR IRRIGATION IR SOLN
Status: DC | PRN
  Administered 2023-02-28: 50 mL

## 2023-02-28 MED ORDER — PHENYLEPHRINE 80 MCG/ML (10ML) SYRINGE FOR IV PUSH (FOR BLOOD PRESSURE SUPPORT)
PREFILLED_SYRINGE | INTRAVENOUS | Status: DC | PRN
  Administered 2023-02-28: 160 ug via INTRAVENOUS

## 2023-02-28 MED ORDER — SODIUM CHLORIDE 0.9 % IV SOLN: INTRAVENOUS | Status: DC | PRN

## 2023-02-28 MED ORDER — LIDOCAINE HCL (CARDIAC) PF 100 MG/5ML IV SOSY
PREFILLED_SYRINGE | INTRAVENOUS | Status: DC | PRN
  Administered 2023-02-28: 50 mg via INTRAVENOUS

## 2023-02-28 MED ORDER — PROPOFOL 10 MG/ML IV BOLUS
INTRAVENOUS | Status: DC | PRN
  Administered 2023-02-28: 100 mg via INTRAVENOUS

## 2023-02-28 MED ORDER — PROPOFOL 500 MG/50ML IV EMUL
INTRAVENOUS | Status: DC | PRN
  Administered 2023-02-28: 100 ug/kg/min via INTRAVENOUS

## 2023-02-28 NOTE — Anesthesia Preprocedure Evaluation (Signed)
Anesthesia Evaluation  Patient identified by MRN, date of birth, ID band Patient awake    Reviewed: Allergy & Precautions, H&P , NPO status , Patient's Chart, lab work & pertinent test results, reviewed documented beta blocker date and time   History of Anesthesia Complications (+) PONV and history of anesthetic complications  Airway Mallampati: II  TM Distance: >3 FB Neck ROM: full    Dental no notable dental hx.    Pulmonary neg pulmonary ROS, shortness of breath, sleep apnea , pneumonia   Pulmonary exam normal breath sounds clear to auscultation       Cardiovascular Exercise Tolerance: Good hypertension, +CHF  negative cardio ROS  Rhythm:regular Rate:Normal     Neuro/Psych negative neurological ROS  negative psych ROS   GI/Hepatic negative GI ROS, Neg liver ROS,GERD  ,,  Endo/Other  negative endocrine ROSdiabetes, Type 2    Renal/GU ESRF and DialysisRenal diseasenegative Renal ROS  negative genitourinary   Musculoskeletal   Abdominal   Peds  Hematology negative hematology ROS (+) Blood dyscrasia, anemia   Anesthesia Other Findings   Reproductive/Obstetrics negative OB ROS                             Anesthesia Physical Anesthesia Plan  ASA: 3  Anesthesia Plan: General   Post-op Pain Management:    Induction:   PONV Risk Score and Plan: Propofol infusion  Airway Management Planned:   Additional Equipment:   Intra-op Plan:   Post-operative Plan:   Informed Consent: I have reviewed the patients History and Physical, chart, labs and discussed the procedure including the risks, benefits and alternatives for the proposed anesthesia with the patient or authorized representative who has indicated his/her understanding and acceptance.     Dental Advisory Given  Plan Discussed with: CRNA  Anesthesia Plan Comments:        Anesthesia Quick Evaluation

## 2023-02-28 NOTE — Progress Notes (Signed)
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

## 2023-02-28 NOTE — Progress Notes (Signed)
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

## 2023-02-28 NOTE — Anesthesia Procedure Notes (Signed)
Date/Time: 02/28/2023 11:13 AM  Performed by: Orlie Dakin, CRNAPre-anesthesia Checklist: Patient identified, Emergency Drugs available, Suction available and Patient being monitored Patient Re-evaluated:Patient Re-evaluated prior to induction Oxygen Delivery Method: Simple face mask Placement Confirmation: positive ETCO2

## 2023-02-28 NOTE — Op Note (Signed)
[facility] Patient Name: Patricia [name] Procedure Date: 02/28/2023 10:50 AM MRN: [id number] Date of Birth: 11-26-1971 Attending MD: Norvel Richards , MD, [id number] CSN: [id number] Age: 52 Admit Type: Outpatient Procedure:                Colonoscopy Indications:              Unexplained iron deficiency anemia Providers:                Norvel Richards, MD, Charlsie Quest. Theda Sers RN, RN,                            Wynonia Musty Tech, Technician Referring MD:              Medicines:                Propofol per Anesthesia Complications:            No immediate complications. Estimated Blood Loss:     Estimated blood loss: none. Procedure:                Pre-Anesthesia Assessment:                           - Prior to the procedure, a History and Physical                            was performed, and patient medications and                            allergies were reviewed. The patient's tolerance of                            previous anesthesia was also reviewed. The risks                            and benefits of the procedure and the sedation                            options and risks were discussed with the patient.                            All questions were answered, and informed consent                            was obtained. Prior Anticoagulants: The patient has                            taken no anticoagulant or antiplatelet agents. ASA                            Grade Assessment: III - A patient with severe                            systemic disease. After reviewing the risks and  benefits, the patient was deemed in satisfactory                            condition to undergo the procedure.                           After obtaining informed consent, the colonoscope                            was passed under direct vision. Throughout the                            procedure, the patient's blood pressure, pulse, and                             oxygen saturations were monitored continuously. The                            [id number] scope was introduced through                            the anus and advanced to the the cecum, identified                            by appendiceal orifice and ileocecal valve. The                            colonoscopy was performed without difficulty. The                            patient tolerated the procedure well. The quality                            of the bowel preparation was adequate. The                            ileocecal valve, appendiceal orifice, and rectum                            were photographed. Scope In: 11:14:04 AM Scope Out: 11:24:05 AM Scope Withdrawal Time: 0 hours 7 minutes 8 seconds  Total Procedure Duration: 0 hours 10 minutes 1 second  Findings:      The perianal and digital rectal examinations were normal. Redundant and       elongated colon      The colon (entire examined portion) appeared normal.      The retroflexed view of the distal rectum and anal verge was normal and       showed no anal or rectal abnormalities. Impression:               - The entire examined colon is normal.                           - The distal rectum and anal verge are normal on  retroflexion view.                           - No specimens collected. Moderate Sedation:      Moderate (conscious) sedation was personally administered by an       anesthesia professional. The following parameters were monitored: oxygen       saturation, heart rate, blood pressure, respiratory rate, EKG, adequacy       of pulmonary ventilation, and response to care. Recommendation:           - Patient has a contact number available for                            emergencies. The signs and symptoms of potential                            delayed complications were discussed with the                            patient. Return to normal activities tomorrow.                             Written discharge instructions were provided to the                            patient.                           - Advance diet as tolerated.                           - Continue present medications.                           - Repeat colonoscopy in 10 years for screening                            purposes.                           - Return to GI office (date not yet determined). Procedure Code(s):        --- Professional ---                           [phone], Colonoscopy, flexible; diagnostic, including                            collection of specimen(s) by brushing or washing,                            when performed (separate procedure) Diagnosis Code(s):        --- Professional ---                           D50.9, Iron deficiency anemia, unspecified CPT copyright 2022 American Medical Association. All rights reserved. The codes documented in this report are preliminary and upon coder  review may  be revised to meet current compliance requirements. Cristopher Estimable. Denis Koppel, MD Norvel Richards, MD 02/28/2023 11:37:21 AM This report has been signed electronically. Number of Addenda: 0

## 2023-02-28 NOTE — Discharge Instructions (Signed)
  Colonoscopy Discharge Instructions  Read the instructions outlined below and refer to this sheet in the next few weeks. These discharge instructions provide you with general information on caring for yourself after you leave the hospital. Your doctor may also give you specific instructions. While your treatment has been planned according to the most current medical practices available, unavoidable complications occasionally occur. If you have any problems or questions after discharge, call Dr. Gala Romney at [phone]. ACTIVITY You may resume your regular activity, but move at a slower pace for the next 24 hours.  Take frequent rest periods for the next 24 hours.  Walking will help get rid of the air and reduce the bloated feeling in your belly (abdomen).  No driving for 24 hours (because of the medicine (anesthesia) used during the test).   Do not sign any important legal documents or operate any machinery for 24 hours (because of the anesthesia used during the test).  NUTRITION Drink plenty of fluids.  You may resume your normal diet as instructed by your doctor.  Begin with a light meal and progress to your normal diet. Heavy or fried foods are harder to digest and may make you feel sick to your stomach (nauseated).  Avoid alcoholic beverages for 24 hours or as instructed.  MEDICATIONS You may resume your normal medications unless your doctor tells you otherwise.  WHAT YOU CAN EXPECT TODAY Some feelings of bloating in the abdomen.  Passage of more gas than usual.  Spotting of blood in your stool or on the toilet paper.  IF YOU HAD POLYPS REMOVED DURING THE COLONOSCOPY: No aspirin products for 7 days or as instructed.  No alcohol for 7 days or as instructed.  Eat a soft diet for the next 24 hours.  FINDING OUT THE RESULTS OF YOUR TEST Not all test results are available during your visit. If your test results are not back during the visit, make an appointment with your caregiver to find out the  results. Do not assume everything is normal if you have not heard from your caregiver or the medical facility. It is important for you to follow up on all of your test results.  SEEK IMMEDIATE MEDICAL ATTENTION IF: You have more than a spotting of blood in your stool.  Your belly is swollen (abdominal distention).  You are nauseated or vomiting.  You have a temperature over 101.  You have abdominal pain or discomfort that is severe or gets worse throughout the day.       Your colon appeared normal today  It is recommended you return in 10 years for repeat colonoscopy for screening purposes   at patient request, I called Jillyn Ledger at [phone] -

## 2023-02-28 NOTE — Interval H&P Note (Signed)
History and Physical Interval Note:  02/28/2023 11:04 AM  Patricia [name]  has presented today for surgery, with the diagnosis of anemia.  The various methods of treatment have been discussed with the patient and family. After consideration of risks, benefits and other options for treatment, the patient has consented to  Procedure(s) with comments: COLONOSCOPY WITH PROPOFOL (N/A) - 11:45 am, asa 3 dialysis pt as a surgical intervention.  The patient's history has been reviewed, patient examined, no change in status, stable for surgery.  I have reviewed the patient's chart and labs.  Questions were answered to the patient's satisfaction.     Patricia [name]     No change.  Diagnostic colonoscopy as planned. The risks, benefits, limitations, alternatives and imponderables have been reviewed with the patient. Questions have been answered. All parties are agreeable.

## 2023-02-28 NOTE — Transfer of Care (Signed)
Immediate Anesthesia Transfer of Care Note  Patient: Patricia [name]  Procedure(s) Performed: COLONOSCOPY WITH PROPOFOL  Patient Location: Short Stay  Anesthesia Type:General  Level of Consciousness: awake  Airway & Oxygen Therapy: Patient Spontanous Breathing  Post-op Assessment: Report given to RN and Post -op Vital signs reviewed and stable  Post vital signs: Reviewed and stable  Last Vitals:  Vitals Value Taken Time  BP    Temp    Pulse    Resp    SpO2      Last Pain:  Vitals:   02/28/23 1109  TempSrc:   PainSc: 0-No pain         Complications: No notable events documented.

## 2023-03-01 DIAGNOSIS — N250 Renal osteodystrophy: Secondary | ICD-10-CM | POA: Diagnosis not present

## 2023-03-01 DIAGNOSIS — N2581 Secondary hyperparathyroidism of renal origin: Secondary | ICD-10-CM | POA: Diagnosis not present

## 2023-03-01 DIAGNOSIS — N186 End stage renal disease: Secondary | ICD-10-CM | POA: Diagnosis not present

## 2023-03-01 DIAGNOSIS — D509 Iron deficiency anemia, unspecified: Secondary | ICD-10-CM | POA: Diagnosis not present

## 2023-03-01 DIAGNOSIS — D631 Anemia in chronic kidney disease: Secondary | ICD-10-CM | POA: Diagnosis not present

## 2023-03-01 DIAGNOSIS — Z992 Dependence on renal dialysis: Secondary | ICD-10-CM | POA: Diagnosis not present

## 2023-03-02 NOTE — Anesthesia Postprocedure Evaluation (Signed)
Anesthesia Post Note  Patient: Patricia [name]  Procedure(s) Performed: COLONOSCOPY WITH PROPOFOL  Patient location during evaluation: Phase II Anesthesia Type: General Level of consciousness: awake Pain management: pain level controlled Vital Signs Assessment: post-procedure vital signs reviewed and stable Respiratory status: spontaneous breathing and respiratory function stable Cardiovascular status: blood pressure returned to baseline and stable Postop Assessment: no headache and no apparent nausea or vomiting Anesthetic complications: no Comments: Late entry   No notable events documented.   Last Vitals:  Vitals:   02/28/23 1015 02/28/23 1129  BP: (!) 134/96 (!) 109/54  Pulse: 75 72  Resp: 19 (!) 30  Temp: 36.7 C (!) 36.4 C  SpO2: 100% 100%    Last Pain:  Vitals:   02/28/23 1129  TempSrc: Oral  PainSc: 0-No pain                 Louann Sjogren

## 2023-03-04 DIAGNOSIS — D509 Iron deficiency anemia, unspecified: Secondary | ICD-10-CM | POA: Diagnosis not present

## 2023-03-04 DIAGNOSIS — Z992 Dependence on renal dialysis: Secondary | ICD-10-CM | POA: Diagnosis not present

## 2023-03-04 DIAGNOSIS — D631 Anemia in chronic kidney disease: Secondary | ICD-10-CM | POA: Diagnosis not present

## 2023-03-04 DIAGNOSIS — N250 Renal osteodystrophy: Secondary | ICD-10-CM | POA: Diagnosis not present

## 2023-03-04 DIAGNOSIS — N186 End stage renal disease: Secondary | ICD-10-CM | POA: Diagnosis not present

## 2023-03-04 DIAGNOSIS — N2581 Secondary hyperparathyroidism of renal origin: Secondary | ICD-10-CM | POA: Diagnosis not present

## 2023-03-06 DIAGNOSIS — D509 Iron deficiency anemia, unspecified: Secondary | ICD-10-CM | POA: Diagnosis not present

## 2023-03-06 DIAGNOSIS — D631 Anemia in chronic kidney disease: Secondary | ICD-10-CM | POA: Diagnosis not present

## 2023-03-06 DIAGNOSIS — N186 End stage renal disease: Secondary | ICD-10-CM | POA: Diagnosis not present

## 2023-03-06 DIAGNOSIS — Z992 Dependence on renal dialysis: Secondary | ICD-10-CM | POA: Diagnosis not present

## 2023-03-06 DIAGNOSIS — N2581 Secondary hyperparathyroidism of renal origin: Secondary | ICD-10-CM | POA: Diagnosis not present

## 2023-03-06 DIAGNOSIS — N250 Renal osteodystrophy: Secondary | ICD-10-CM | POA: Diagnosis not present

## 2023-03-07 DIAGNOSIS — E1129 Type 2 diabetes mellitus with other diabetic kidney complication: Secondary | ICD-10-CM | POA: Diagnosis not present

## 2023-03-07 DIAGNOSIS — N185 Chronic kidney disease, stage 5: Secondary | ICD-10-CM | POA: Diagnosis not present

## 2023-03-07 DIAGNOSIS — I10 Essential (primary) hypertension: Secondary | ICD-10-CM | POA: Diagnosis not present

## 2023-03-07 DIAGNOSIS — Z992 Dependence on renal dialysis: Secondary | ICD-10-CM | POA: Diagnosis not present

## 2023-03-07 DIAGNOSIS — K219 Gastro-esophageal reflux disease without esophagitis: Secondary | ICD-10-CM | POA: Diagnosis not present

## 2023-03-07 DIAGNOSIS — Z6841 Body Mass Index (BMI) 40.0 and over, adult: Secondary | ICD-10-CM | POA: Diagnosis not present

## 2023-03-07 DIAGNOSIS — E782 Mixed hyperlipidemia: Secondary | ICD-10-CM | POA: Diagnosis not present

## 2023-03-07 DIAGNOSIS — Z1331 Encounter for screening for depression: Secondary | ICD-10-CM | POA: Diagnosis not present

## 2023-03-07 DIAGNOSIS — R0902 Hypoxemia: Secondary | ICD-10-CM | POA: Diagnosis not present

## 2023-03-07 DIAGNOSIS — Z0001 Encounter for general adult medical examination with abnormal findings: Secondary | ICD-10-CM | POA: Diagnosis not present

## 2023-03-07 DIAGNOSIS — G4733 Obstructive sleep apnea (adult) (pediatric): Secondary | ICD-10-CM | POA: Diagnosis not present

## 2023-03-07 LAB — HEMOGLOBIN A1C: Hemoglobin A1C: 6.2

## 2023-03-08 DIAGNOSIS — Z992 Dependence on renal dialysis: Secondary | ICD-10-CM | POA: Diagnosis not present

## 2023-03-08 DIAGNOSIS — N2581 Secondary hyperparathyroidism of renal origin: Secondary | ICD-10-CM | POA: Diagnosis not present

## 2023-03-08 DIAGNOSIS — D631 Anemia in chronic kidney disease: Secondary | ICD-10-CM | POA: Diagnosis not present

## 2023-03-08 DIAGNOSIS — D509 Iron deficiency anemia, unspecified: Secondary | ICD-10-CM | POA: Diagnosis not present

## 2023-03-08 DIAGNOSIS — N250 Renal osteodystrophy: Secondary | ICD-10-CM | POA: Diagnosis not present

## 2023-03-08 DIAGNOSIS — N186 End stage renal disease: Secondary | ICD-10-CM | POA: Diagnosis not present

## 2023-03-11 DIAGNOSIS — N186 End stage renal disease: Secondary | ICD-10-CM | POA: Diagnosis not present

## 2023-03-11 DIAGNOSIS — N2581 Secondary hyperparathyroidism of renal origin: Secondary | ICD-10-CM | POA: Diagnosis not present

## 2023-03-11 DIAGNOSIS — D509 Iron deficiency anemia, unspecified: Secondary | ICD-10-CM | POA: Diagnosis not present

## 2023-03-11 DIAGNOSIS — Z992 Dependence on renal dialysis: Secondary | ICD-10-CM | POA: Diagnosis not present

## 2023-03-11 DIAGNOSIS — N250 Renal osteodystrophy: Secondary | ICD-10-CM | POA: Diagnosis not present

## 2023-03-11 DIAGNOSIS — D631 Anemia in chronic kidney disease: Secondary | ICD-10-CM | POA: Diagnosis not present

## 2023-03-12 ENCOUNTER — Encounter (HOSPITAL_COMMUNITY): Payer: Self-pay | Admitting: Internal Medicine

## 2023-03-13 DIAGNOSIS — D631 Anemia in chronic kidney disease: Secondary | ICD-10-CM | POA: Diagnosis not present

## 2023-03-13 DIAGNOSIS — Z992 Dependence on renal dialysis: Secondary | ICD-10-CM | POA: Diagnosis not present

## 2023-03-13 DIAGNOSIS — N2581 Secondary hyperparathyroidism of renal origin: Secondary | ICD-10-CM | POA: Diagnosis not present

## 2023-03-13 DIAGNOSIS — N250 Renal osteodystrophy: Secondary | ICD-10-CM | POA: Diagnosis not present

## 2023-03-13 DIAGNOSIS — D509 Iron deficiency anemia, unspecified: Secondary | ICD-10-CM | POA: Diagnosis not present

## 2023-03-13 DIAGNOSIS — N186 End stage renal disease: Secondary | ICD-10-CM | POA: Diagnosis not present

## 2023-03-15 DIAGNOSIS — N186 End stage renal disease: Secondary | ICD-10-CM | POA: Diagnosis not present

## 2023-03-15 DIAGNOSIS — Z992 Dependence on renal dialysis: Secondary | ICD-10-CM | POA: Diagnosis not present

## 2023-03-15 DIAGNOSIS — N250 Renal osteodystrophy: Secondary | ICD-10-CM | POA: Diagnosis not present

## 2023-03-15 DIAGNOSIS — N2581 Secondary hyperparathyroidism of renal origin: Secondary | ICD-10-CM | POA: Diagnosis not present

## 2023-03-15 DIAGNOSIS — D509 Iron deficiency anemia, unspecified: Secondary | ICD-10-CM | POA: Diagnosis not present

## 2023-03-15 DIAGNOSIS — D631 Anemia in chronic kidney disease: Secondary | ICD-10-CM | POA: Diagnosis not present

## 2023-03-17 DIAGNOSIS — Z992 Dependence on renal dialysis: Secondary | ICD-10-CM | POA: Diagnosis not present

## 2023-03-17 DIAGNOSIS — N186 End stage renal disease: Secondary | ICD-10-CM | POA: Diagnosis not present

## 2023-03-18 DIAGNOSIS — Z23 Encounter for immunization: Secondary | ICD-10-CM | POA: Diagnosis not present

## 2023-03-18 DIAGNOSIS — D631 Anemia in chronic kidney disease: Secondary | ICD-10-CM | POA: Diagnosis not present

## 2023-03-18 DIAGNOSIS — N250 Renal osteodystrophy: Secondary | ICD-10-CM | POA: Diagnosis not present

## 2023-03-18 DIAGNOSIS — Z992 Dependence on renal dialysis: Secondary | ICD-10-CM | POA: Diagnosis not present

## 2023-03-18 DIAGNOSIS — N186 End stage renal disease: Secondary | ICD-10-CM | POA: Diagnosis not present

## 2023-03-18 DIAGNOSIS — N2581 Secondary hyperparathyroidism of renal origin: Secondary | ICD-10-CM | POA: Diagnosis not present

## 2023-03-18 DIAGNOSIS — D509 Iron deficiency anemia, unspecified: Secondary | ICD-10-CM | POA: Diagnosis not present

## 2023-03-20 ENCOUNTER — Telehealth: Payer: Self-pay

## 2023-03-20 DIAGNOSIS — D509 Iron deficiency anemia, unspecified: Secondary | ICD-10-CM | POA: Diagnosis not present

## 2023-03-20 DIAGNOSIS — N2581 Secondary hyperparathyroidism of renal origin: Secondary | ICD-10-CM | POA: Diagnosis not present

## 2023-03-20 DIAGNOSIS — N186 End stage renal disease: Secondary | ICD-10-CM | POA: Diagnosis not present

## 2023-03-20 DIAGNOSIS — Z992 Dependence on renal dialysis: Secondary | ICD-10-CM | POA: Diagnosis not present

## 2023-03-20 DIAGNOSIS — Z23 Encounter for immunization: Secondary | ICD-10-CM | POA: Diagnosis not present

## 2023-03-20 DIAGNOSIS — D631 Anemia in chronic kidney disease: Secondary | ICD-10-CM | POA: Diagnosis not present

## 2023-03-20 NOTE — Telephone Encounter (Signed)
Pt's [employer] pt's paperwork finished and faxed to the company along with insurance , med list, and income. Waiting on a response

## 2023-03-21 ENCOUNTER — Ambulatory Visit (INDEPENDENT_AMBULATORY_CARE_PROVIDER_SITE_OTHER): Payer: Medicare Other | Admitting: "Endocrinology

## 2023-03-21 ENCOUNTER — Encounter: Payer: Self-pay | Admitting: "Endocrinology

## 2023-03-21 VITALS — BP 116/66 | HR 84 | Ht 63.0 in | Wt 240.0 lb

## 2023-03-21 DIAGNOSIS — E049 Nontoxic goiter, unspecified: Secondary | ICD-10-CM

## 2023-03-21 DIAGNOSIS — E782 Mixed hyperlipidemia: Secondary | ICD-10-CM

## 2023-03-21 DIAGNOSIS — N186 End stage renal disease: Secondary | ICD-10-CM | POA: Diagnosis not present

## 2023-03-21 DIAGNOSIS — E1122 Type 2 diabetes mellitus with diabetic chronic kidney disease: Secondary | ICD-10-CM

## 2023-03-21 MED ORDER — TRESIBA FLEXTOUCH 200 UNIT/ML ~~LOC~~ SOPN
20.0000 [IU] | PEN_INJECTOR | Freq: Every day | SUBCUTANEOUS | 1 refills | Status: DC
Start: 2023-03-21 — End: 2023-08-06

## 2023-03-21 NOTE — Progress Notes (Signed)
03/21/2023, 11:42 AM   Endocrinology follow-up note  Subjective:    Patient ID: Patricia [name], female    DOB: 11-21-1971, PCP Jake Samples, PA-C   Past Medical History:  Diagnosis Date   Anemia    Ankle fracture    Arthritis    Blood transfusion without reported diagnosis    Breast cancer    Cancer    Diabetes mellitus without complication    Dialysis patient    mon, wed, friday,    End stage renal disease on dialysis    M/W/F [facility] in    GERD (gastroesophageal reflux disease)    Hypertension    OSA (obstructive sleep apnea)    uses CPAP sometimes   Pneumonia    PONV (postoperative nausea and vomiting)    Wears glasses    Past Surgical History:  Procedure Laterality Date   ABDOMINAL HYSTERECTOMY     AV FISTULA PLACEMENT  11/2014   at [facility] N/A 04/2021   BALLOON DILATION N/A 07/10/2016   Procedure: BALLOON DILATION;  Surgeon: Danie Binder, MD;  [facility];  Service: Endoscopy;  Laterality: N/A;  Pyloric dilation   BREAST LUMPECTOMY     CESAREAN SECTION     CHOLECYSTECTOMY     COLONOSCOPY  02/2023   COLONOSCOPY WITH PROPOFOL N/A 09/27/2016   Dr. Gala Romney: Internal hemorrhoids repeat colonoscopy in 10 years   COLONOSCOPY WITH PROPOFOL N/A 02/28/2023   Procedure: COLONOSCOPY WITH PROPOFOL;  Surgeon: Daneil Dolin, MD;  [facility];  Service: Endoscopy;  Laterality: N/A;  11:45 am, asa 3 dialysis pt   DILATION AND CURETTAGE OF UTERUS     ESOPHAGOGASTRODUODENOSCOPY N/A 07/10/2016   Dr.Fields- normal esophagus, gastric stenosis was found at the pylorus, gastritis on bx, normal examined duodenun   ESOPHAGOGASTRODUODENOSCOPY (EGD) WITH PROPOFOL N/A 11/21/2022   Procedure: ESOPHAGOGASTRODUODENOSCOPY (EGD) WITH PROPOFOL;  Surgeon: Harvel Quale, MD;  [facility];  Service: Gastroenterology;  Laterality: N/A;   EXTERNAL FIXATION  REMOVAL Right 10/29/2018   Procedure: REMOVAL RIGHT ANKLE BIOMET ZIMMER EXTERNAL FIXATOR, SHORT LEG CAST APPLICATION;  Surgeon: Marybelle Killings, MD;  Location: [facility];  Service: Orthopedics;  Laterality: Right;   MASTECTOMY     left sided   ORIF ANKLE FRACTURE Right 10/06/2018   Procedure: OPEN REDUCTION INTERNAL FIXATION (ORIF) RIGHT ANKLE TRIMALLEOLAR;  Surgeon: Marybelle Killings, MD;  Location: [facility];  Service: Orthopedics;  Laterality: Right;   RIGHT/LEFT HEART CATH AND CORONARY ANGIOGRAPHY N/A 11/27/2022   Procedure: RIGHT/LEFT HEART CATH AND CORONARY ANGIOGRAPHY;  Surgeon: Martinique, Peter M, MD;  Location: [facility];  Service: Cardiovascular;  Laterality: N/A;   Social History   Socioeconomic History   Marital status: Widowed    Spouse name: Not on file   Number of children: Not on file   Years of education: Not on file   Highest education level: Not on file  Occupational History   Not on file  Tobacco Use   Smoking status: Never   Smokeless tobacco: Never  Vaping Use   Vaping Use: Never used  Substance and Sexual Activity   Alcohol use: No  Alcohol/week: 0.0 standard drinks of alcohol   Drug use: No   Sexual activity: Not Currently  Other Topics Concern   Not on file  Social History Narrative   Not on file   Social Determinants of Health   Financial Resource Strain: Low Risk  (02/21/2023)   Overall Financial Resource Strain (CARDIA)    Difficulty of Paying Living Expenses: Not very hard  Food Insecurity: No Food Insecurity (12/18/2022)   Hunger Vital Sign    Worried About Running Out of Food in the Last Year: Never true    Ran Out of Food in the Last Year: Never true  Transportation Needs: No Transportation Needs (02/21/2023)   PRAPARE - Hydrologist (Medical): No    Lack of Transportation (Non-Medical): No  Physical Activity: Not on file  Stress: Not on file  Social Connections: Not on file   Family History  Problem Relation Age of  Onset   Diabetes Mellitus II Mother    Hypertension Mother    Heart block Mother    Hypertension Sister    Hypertension Sister    Colon cancer Neg Hx    Outpatient Encounter Medications as of 03/21/2023  Medication Sig   acetaminophen (TYLENOL) 325 MG tablet Take 650 mg by mouth every 6 (six) hours as needed for mild pain or fever.   albuterol (PROVENTIL) (2.5 MG/3ML) 0.083% nebulizer solution Take 3 mLs (2.5 mg total) by nebulization every 6 (six) hours as needed for wheezing or shortness of breath.   albuterol (VENTOLIN HFA) 108 (90 Base) MCG/ACT inhaler Inhale 2 puffs into the lungs every 6 (six) hours as needed for wheezing or shortness of breath.   aspirin EC 81 MG tablet Take 1 tablet (81 mg total) by mouth daily. Swallow whole.   cetirizine (ZYRTEC) 10 MG tablet Take 1 tablet (10 mg total) by mouth daily.   Cholecalciferol (VITAMIN D3) 125 MCG (5000 UT) CAPS TAKE 1 CAPSULE BY MOUTH EVERY DAY   cinacalcet (SENSIPAR) 30 MG tablet Take 30 mg by mouth daily with breakfast.   dicyclomine (BENTYL) 10 MG capsule Take 1 capsule (10 mg total) by mouth 2 (two) times daily as needed. (Patient taking differently: Take 10 mg by mouth 2 (two) times daily as needed for spasms.)   docusate sodium (COLACE) 100 MG capsule Take 100 mg by mouth daily.   ferric citrate (AURYXIA) 1 GM 210 MG(Fe) tablet Take 420 mg by mouth 2 (two) times daily with a meal. With Breakfast & with supper   glipiZIDE (GLUCOTROL) 5 MG tablet Take 1 tablet (5 mg total) by mouth 2 (two) times daily before a meal.   insulin degludec (TRESIBA FLEXTOUCH) 200 UNIT/ML FlexTouch Pen Inject 20 Units into the skin at bedtime.   ipratropium (ATROVENT) 0.03 % nasal spray Place 2 sprays into both nostrils 2 (two) times daily.    irbesartan (AVAPRO) 75 MG tablet Take 75 mg by mouth daily.   labetalol (NORMODYNE) 100 MG tablet Take 100 mg by mouth 2 (two) times daily.   lanthanum (FOSRENOL) 500 MG chewable tablet Chew 1,000 mg by mouth 3 (three)  times daily with meals.   linaclotide (LINZESS) 290 MCG CAPS capsule Take 290 mcg by mouth daily before breakfast.   montelukast (SINGULAIR) 10 MG tablet TAKE 1 TABLET BY MOUTH EVERYDAY AT BEDTIME (Patient taking differently: Take 10 mg by mouth at bedtime.)   multivitamin (RENA-VIT) TABS tablet Take 1 tablet by mouth daily.   NIFEdipine (ADALAT CC)  30 MG 24 hr tablet Take 30 mg by mouth at bedtime.   nitroGLYCERIN (NITROSTAT) 0.4 MG SL tablet Place 1 tablet (0.4 mg total) under the tongue every 5 (five) minutes as needed for chest pain.   ondansetron (ZOFRAN-ODT) 4 MG disintegrating tablet Take 1 tablet (4 mg total) by mouth every 8 (eight) hours as needed for nausea or vomiting.   OXYGEN Inhale 2 L into the lungs as needed (exertion).   pantoprazole (PROTONIX) 40 MG tablet TAKE 1 TABLET (40 MG TOTAL) BY MOUTH DAILY 30 MINUTES BEFORE BREAKFAST   rOPINIRole (REQUIP XL) 2 MG 24 hr tablet Take 2 mg by mouth at bedtime.    rosuvastatin (CRESTOR) 20 MG tablet Take 20 mg by mouth daily.   sodium chloride (OCEAN) 0.65 % SOLN nasal spray Place 1 spray into both nostrils as needed for congestion.   SYMBICORT 160-4.5 MCG/ACT inhaler Inhale 2 puffs into the lungs daily.   [DISCONTINUED] insulin degludec (TRESIBA FLEXTOUCH) 200 UNIT/ML FlexTouch Pen Inject 30 Units into the skin at bedtime.   No facility-administered encounter medications on file as of 03/21/2023.   ALLERGIES: Allergies  Allergen Reactions   Amlodipine Besylate Rash and Other (See Comments)    dizziness   Reglan [Metoclopramide] Other (See Comments)    hallucinations     VACCINATION STATUS: Immunization History  Administered Date(s) Administered   Influenza Split 09/20/2015   Influenza,trivalent, recombinat, inj, PF 09/20/2015   Influenza-Unspecified 09/30/2014, 08/25/2015, 09/06/2016, 08/19/2017, 10/04/2017, 10/15/2018, 09/25/2019, 10/03/2020, 08/31/2022   Moderna SARS-COV2 Booster Vaccination 11/07/2021   Moderna Sars-Covid-2  Vaccination 03/02/2020, 03/30/2020, 05/02/2020, 12/16/2020   PPD Test 08/10/2015, 09/06/2015, 02/10/2016, 09/09/2017, 10/20/2018, 11/23/2019, 11/30/2020, 11/08/2021, 12/06/2021   Pneumococcal Polysaccharide-23 01/03/2022   Pneumococcal-Unspecified 03/02/2016, 02/20/2017   Unspecified SARS-COV-2 Vaccination 03/02/2020, 03/30/2020    HPI Sosie Sweaney is 52 y.o. female who presents today with a medical history as above. she is being seen in follow-up after she was seen in consultation for management of type 2 diabetes complicated by ESRD, nodular goiter requested by [name], PA-C.   She is status post fine-needle aspiration biopsy with benign outcomes.  She has no new complaints about her thyroid today.  See notes from previous visit. She underwent thyroid ultrasound on January 04, 2022 which showed significant goiter with right lobe measuring from 1.1 cm and left lobe measuring 4.5 cm.  She was found to have 4.6 cm nodule in the right lobe.  She underwent fine-needle aspiration of the biopsy in March with benign outcomes.   Her recent thyroid function tests show mild subclinical hypothyroidism. She denies any prior history of thyroid dysfunction.  She denies dysphagia, shortness of breath, nor voice change.  She denies any family history of thyroid malignancy.    However, patient has personal history of breast cancer, status post lobectomy in 2019. She returns with improved glycemic control.  Her meter average is 141 for the last 28 days out of 36 blood glucose readings.  Her point-of-care A1c is 6.2%, this is a general improvement from 9.5%. No hypoglycemia is reported.  Her type 2 diabetes is complicated by ESRD on hemodialysis.  The cause of  ESRD is said to be hypertension, diabetes.      She is currently taking glipizide 5 mg twice daily and Tresiba 30 units qhs. .  She is increasingly getting comfortable using insulin She  has advanced COPD/asthma. Patient is on multiple  medications.  Not currently on steroids for  Review of Systems  Constitutional: +  She reports fluctuating body weight, + fatigue, no subjective hyperthermia, no subjective hypothermia   Objective:       03/21/2023    8:54 AM 02/28/2023   11:29 AM 02/28/2023   10:15 AM  Vitals with BMI  Height     Weight 240 lbs  235 lbs  BMI 42.52  41.64  Systolic 116 109 161  Diastolic 66 54 96  Pulse 84 72 75    BP 116/66   Pulse 84   Ht  (1.6 m)   Wt 240 lb (108.9 kg)   BMI 42.51 kg/m   Wt Readings from Last 3 Encounters:  03/21/23 240 lb (108.9 kg)  02/28/23 235 lb (106.6 kg)  02/12/23 242 lb 9.6 oz (110 kg)    Physical Exam  Constitutional:  Body mass index is 42.51 kg/m.,  not in acute distress, normal state of mind Eyes: PERRLA, EOMI, no exophthalmos ENT: moist mucous membranes, + gross thyromegaly, no gross cervical lymphadenopathy   CMP ( most recent) CMP     Component Value Date/Time   NA 136 02/28/2023 1029   K 4.5 02/28/2023 1029   CL 95 (L) 02/28/2023 1029   CO2 23 02/28/2023 1029   GLUCOSE 100 (H) 02/28/2023 1029   BUN 34 (H) 02/28/2023 1029   CREATININE 8.55 (H) 02/28/2023 1029   CREATININE 5.93 (H) 06/29/2019 1210   CALCIUM 9.5 02/28/2023 1029   PROT 6.7 12/11/2022 0351   PROT 7.8 05/17/2022 1607   ALBUMIN 3.7 12/11/2022 0351   ALBUMIN 4.9 05/17/2022 1607   AST 24 12/11/2022 0351   ALT 48 (H) 12/11/2022 0351   ALKPHOS 103 12/11/2022 0351   BILITOT 0.7 12/11/2022 0351   BILITOT 0.4 05/17/2022 1607   GFRNONAA 5 (L) 02/28/2023 1029   GFRNONAA 8 (L) 06/29/2019 1210   GFRAA 4 (L) 08/04/2020 0452   GFRAA 9 (L) 06/29/2019 1210     Diabetic Labs (most recent): Lab Results  Component Value Date   HGBA1C 6.2 03/07/2023   HGBA1C 9.5 (H) 11/19/2022   HGBA1C 8.3 (H) 06/26/2021     Lipid Panel ( most recent) Lipid Panel     Component Value Date/Time   CHOL 185 05/17/2022 1607   TRIG 58 05/17/2022 1607   HDL 108 05/17/2022 1607    CHOLHDL 1.9 01/04/2022 1003   VLDL 10 01/04/2022 1003   LDLCALC 66 05/17/2022 1607   LABVLDL 11 05/17/2022 1607   Summary of her thyroid ultrasound from January 05, 2022: Right lobe 7.1 cm, 4.6 cm nodule Left lobe 4.5 cm.   Fine-needle aspiration biopsy on February 27, 2022: FINAL MICROSCOPIC DIAGNOSIS:  - Consistent with benign follicular nodule (Bethesda category II)   SPECIMEN ADEQUACY:  Satisfactory for evaluation    Assessment & Plan:   1.  Type 2 diabetes-uncontrolled Complicated by ESRD currently on hemodialysis. Based on her continued hyperglycemic response with tightening glycemic profile with A1c of 6.2%, she is advised to lower her Evaristo Bury to 20 units nightly, associated with monitoring of blood glucose twice a day-before breakfast and at bedtime.    -She is encouraged to call clinic for hypoglycemia of less than 70 and persistent hyperglycemia > 200 mg /dl at fasting 3 days in a week.  In the meantime, she is advised to continue glipizide 10 mg p.o. twice daily-with breakfast and supper.  She is also advised to continue glipizide at a lower dose of 5 mg p.o. twice daily with breakfast and supper.   She  is not a candidate for metformin.  She will be considered for GLP1 RA as appropriate on subsequent visits. She has controlled lipid panel with most recent LDL of 56.  She is advised to continue Crestor 20 mg p.o. nightly.  Side effects and precautions discussed with her.    2. Nodular goiter I reviewed her labs with her. Her thyroid function tests are consistent with mild subclinical hyperthyroidism .  She will not need intervention with thyroid hormone nor antithyroid therapy at this time. - she has nodular goiter which was worked up completely including with fine-needle aspiration with benign outcomes.  She will not need surgical intervention at this time.  She does not have any compressive concerns at this time.  Due to the sheer size of her thyroid which is asymmetrically  enlarged with 7.1 cm right lobe, 4.5 cm left lobe , she will need surveillance thyroid ultrasound in 1 year.   This patient will need bone density in the next subsequent days.  She has several comorbid conditions including type 2 diabetes, hypertension, ESRD on hemodialysis, hyperlipidemia, COPD/asthma, hyperlipidemia.  - she is advised to maintain close follow up with [name], PA-C for primary care needs.   I spent  26  minutes in the care of the patient today including review of labs from CMP, Lipids, Thyroid Function, Hematology (current and previous including abstractions from other facilities); face-to-face time discussing  her blood glucose readings/logs, discussing hypoglycemia and hyperglycemia episodes and symptoms, medications doses, her options of short and [facility] term treatment based on the latest standards of care / guidelines;  discussion about incorporating lifestyle medicine;  and documenting the encounter. Risk reduction counseling performed per USPSTF guidelines to reduce  obesity and cardiovascular risk factors.     Please refer to Patient Instructions for Blood Glucose Monitoring and Insulin/Medications Dosing Guide"  in media tab for additional information. Please  also refer to " Patient Self Inventory" in the Media  tab for reviewed elements of pertinent patient history.  Konni Thorman participated in the discussions, expressed understanding, and voiced agreement with the above plans.  All questions were answered to her satisfaction. she is encouraged to contact clinic should she have any questions or concerns prior to her return visit.    Follow up plan: Return in about 3 months (around 06/20/2023) for F/U with Pre-visit Labs, Meter/CGM/Logs, A1c here, Thyroid / Neck Ultrasound.   Marquis Lunch, MD [facility] [address] [place], [place] [address] Phone: [phone]  Fax: [phone]     03/21/2023, 11:42  AM  This note was partially dictated with voice recognition software. Similar sounding words can be transcribed inadequately or may not  be corrected upon review.

## 2023-03-22 DIAGNOSIS — N186 End stage renal disease: Secondary | ICD-10-CM | POA: Diagnosis not present

## 2023-03-22 DIAGNOSIS — D509 Iron deficiency anemia, unspecified: Secondary | ICD-10-CM | POA: Diagnosis not present

## 2023-03-22 DIAGNOSIS — Z23 Encounter for immunization: Secondary | ICD-10-CM | POA: Diagnosis not present

## 2023-03-22 DIAGNOSIS — D631 Anemia in chronic kidney disease: Secondary | ICD-10-CM | POA: Diagnosis not present

## 2023-03-22 DIAGNOSIS — N2581 Secondary hyperparathyroidism of renal origin: Secondary | ICD-10-CM | POA: Diagnosis not present

## 2023-03-22 DIAGNOSIS — Z992 Dependence on renal dialysis: Secondary | ICD-10-CM | POA: Diagnosis not present

## 2023-03-23 DIAGNOSIS — E782 Mixed hyperlipidemia: Secondary | ICD-10-CM | POA: Insufficient documentation

## 2023-03-25 DIAGNOSIS — N2581 Secondary hyperparathyroidism of renal origin: Secondary | ICD-10-CM | POA: Diagnosis not present

## 2023-03-25 DIAGNOSIS — D509 Iron deficiency anemia, unspecified: Secondary | ICD-10-CM | POA: Diagnosis not present

## 2023-03-25 DIAGNOSIS — Z23 Encounter for immunization: Secondary | ICD-10-CM | POA: Diagnosis not present

## 2023-03-25 DIAGNOSIS — D631 Anemia in chronic kidney disease: Secondary | ICD-10-CM | POA: Diagnosis not present

## 2023-03-25 DIAGNOSIS — Z992 Dependence on renal dialysis: Secondary | ICD-10-CM | POA: Diagnosis not present

## 2023-03-25 DIAGNOSIS — N186 End stage renal disease: Secondary | ICD-10-CM | POA: Diagnosis not present

## 2023-03-26 ENCOUNTER — Encounter: Payer: Self-pay | Admitting: *Deleted

## 2023-03-27 DIAGNOSIS — N2581 Secondary hyperparathyroidism of renal origin: Secondary | ICD-10-CM | POA: Diagnosis not present

## 2023-03-27 DIAGNOSIS — Z23 Encounter for immunization: Secondary | ICD-10-CM | POA: Diagnosis not present

## 2023-03-27 DIAGNOSIS — Z992 Dependence on renal dialysis: Secondary | ICD-10-CM | POA: Diagnosis not present

## 2023-03-27 DIAGNOSIS — D509 Iron deficiency anemia, unspecified: Secondary | ICD-10-CM | POA: Diagnosis not present

## 2023-03-27 DIAGNOSIS — N186 End stage renal disease: Secondary | ICD-10-CM | POA: Diagnosis not present

## 2023-03-27 DIAGNOSIS — D631 Anemia in chronic kidney disease: Secondary | ICD-10-CM | POA: Diagnosis not present

## 2023-03-29 DIAGNOSIS — Z992 Dependence on renal dialysis: Secondary | ICD-10-CM | POA: Diagnosis not present

## 2023-03-29 DIAGNOSIS — D509 Iron deficiency anemia, unspecified: Secondary | ICD-10-CM | POA: Diagnosis not present

## 2023-03-29 DIAGNOSIS — N186 End stage renal disease: Secondary | ICD-10-CM | POA: Diagnosis not present

## 2023-03-29 DIAGNOSIS — N2581 Secondary hyperparathyroidism of renal origin: Secondary | ICD-10-CM | POA: Diagnosis not present

## 2023-03-29 DIAGNOSIS — D631 Anemia in chronic kidney disease: Secondary | ICD-10-CM | POA: Diagnosis not present

## 2023-03-29 DIAGNOSIS — Z23 Encounter for immunization: Secondary | ICD-10-CM | POA: Diagnosis not present

## 2023-03-29 NOTE — Telephone Encounter (Signed)
Phoned and LMOVM of the pt requesting update of her Linzess

## 2023-04-01 DIAGNOSIS — N2581 Secondary hyperparathyroidism of renal origin: Secondary | ICD-10-CM | POA: Diagnosis not present

## 2023-04-01 DIAGNOSIS — D631 Anemia in chronic kidney disease: Secondary | ICD-10-CM | POA: Diagnosis not present

## 2023-04-01 DIAGNOSIS — Z992 Dependence on renal dialysis: Secondary | ICD-10-CM | POA: Diagnosis not present

## 2023-04-01 DIAGNOSIS — Z23 Encounter for immunization: Secondary | ICD-10-CM | POA: Diagnosis not present

## 2023-04-01 DIAGNOSIS — N186 End stage renal disease: Secondary | ICD-10-CM | POA: Diagnosis not present

## 2023-04-01 DIAGNOSIS — D509 Iron deficiency anemia, unspecified: Secondary | ICD-10-CM | POA: Diagnosis not present

## 2023-04-03 DIAGNOSIS — D509 Iron deficiency anemia, unspecified: Secondary | ICD-10-CM | POA: Diagnosis not present

## 2023-04-03 DIAGNOSIS — Z992 Dependence on renal dialysis: Secondary | ICD-10-CM | POA: Diagnosis not present

## 2023-04-03 DIAGNOSIS — Z23 Encounter for immunization: Secondary | ICD-10-CM | POA: Diagnosis not present

## 2023-04-03 DIAGNOSIS — N2581 Secondary hyperparathyroidism of renal origin: Secondary | ICD-10-CM | POA: Diagnosis not present

## 2023-04-03 DIAGNOSIS — D631 Anemia in chronic kidney disease: Secondary | ICD-10-CM | POA: Diagnosis not present

## 2023-04-03 DIAGNOSIS — N186 End stage renal disease: Secondary | ICD-10-CM | POA: Diagnosis not present

## 2023-04-04 ENCOUNTER — Telehealth: Payer: Self-pay | Admitting: *Deleted

## 2023-04-04 NOTE — Progress Notes (Signed)
  Care Coordination Note  04/04/2023 Name: Patricia [name] MRN: [id number] DOB: Sep 21, 1971  Patricia [name] is a 52 y.o. year old female who is a primary care patient of Ladon Applebaum and is actively engaged with the care management team. I reached out to [employer] by phone today to assist with re-scheduling a follow up visit with the RN Case Manager  Follow up plan: Unsuccessful telephone outreach attempt made. A HIPAA compliant phone message was left for the patient providing contact information and requesting a return call.  [facility]  Care Coordination Care Guide  Direct Dial: [phone]

## 2023-04-05 DIAGNOSIS — N186 End stage renal disease: Secondary | ICD-10-CM | POA: Diagnosis not present

## 2023-04-05 DIAGNOSIS — D509 Iron deficiency anemia, unspecified: Secondary | ICD-10-CM | POA: Diagnosis not present

## 2023-04-05 DIAGNOSIS — N2581 Secondary hyperparathyroidism of renal origin: Secondary | ICD-10-CM | POA: Diagnosis not present

## 2023-04-05 DIAGNOSIS — D631 Anemia in chronic kidney disease: Secondary | ICD-10-CM | POA: Diagnosis not present

## 2023-04-05 DIAGNOSIS — Z992 Dependence on renal dialysis: Secondary | ICD-10-CM | POA: Diagnosis not present

## 2023-04-05 DIAGNOSIS — Z23 Encounter for immunization: Secondary | ICD-10-CM | POA: Diagnosis not present

## 2023-04-08 DIAGNOSIS — D509 Iron deficiency anemia, unspecified: Secondary | ICD-10-CM | POA: Diagnosis not present

## 2023-04-08 DIAGNOSIS — Z992 Dependence on renal dialysis: Secondary | ICD-10-CM | POA: Diagnosis not present

## 2023-04-08 DIAGNOSIS — N186 End stage renal disease: Secondary | ICD-10-CM | POA: Diagnosis not present

## 2023-04-08 DIAGNOSIS — N2581 Secondary hyperparathyroidism of renal origin: Secondary | ICD-10-CM | POA: Diagnosis not present

## 2023-04-08 DIAGNOSIS — D631 Anemia in chronic kidney disease: Secondary | ICD-10-CM | POA: Diagnosis not present

## 2023-04-08 DIAGNOSIS — Z23 Encounter for immunization: Secondary | ICD-10-CM | POA: Diagnosis not present

## 2023-04-10 DIAGNOSIS — Z23 Encounter for immunization: Secondary | ICD-10-CM | POA: Diagnosis not present

## 2023-04-10 DIAGNOSIS — Z992 Dependence on renal dialysis: Secondary | ICD-10-CM | POA: Diagnosis not present

## 2023-04-10 DIAGNOSIS — D509 Iron deficiency anemia, unspecified: Secondary | ICD-10-CM | POA: Diagnosis not present

## 2023-04-10 DIAGNOSIS — D631 Anemia in chronic kidney disease: Secondary | ICD-10-CM | POA: Diagnosis not present

## 2023-04-10 DIAGNOSIS — N186 End stage renal disease: Secondary | ICD-10-CM | POA: Diagnosis not present

## 2023-04-10 DIAGNOSIS — N2581 Secondary hyperparathyroidism of renal origin: Secondary | ICD-10-CM | POA: Diagnosis not present

## 2023-04-11 NOTE — Progress Notes (Signed)
  Care Coordination Note  04/11/2023 Name: Patricia [name] MRN: [id number] DOB: August 25, 1971  Patricia [name] is a 52 y.o. year old female who is a primary care patient of Ladon Applebaum and is actively engaged with the care management team. I reached out to [employer] by phone today to assist with re-scheduling a follow up visit with the RN Case Manager  Follow up plan: Unsuccessful telephone outreach attempt made. A HIPAA compliant phone message was left for the patient providing contact information and requesting a return call. We have been unable to make contact with the patient for follow up. The care management team is available to follow up with the patient after provider conversation with the patient regarding recommendation for care management engagement and subsequent re-referral to the care management team.   [facility] Coordination Care Guide  Direct Dial: [phone]

## 2023-04-12 ENCOUNTER — Telehealth: Payer: Self-pay | Admitting: *Deleted

## 2023-04-12 DIAGNOSIS — D509 Iron deficiency anemia, unspecified: Secondary | ICD-10-CM | POA: Diagnosis not present

## 2023-04-12 DIAGNOSIS — N2581 Secondary hyperparathyroidism of renal origin: Secondary | ICD-10-CM | POA: Diagnosis not present

## 2023-04-12 DIAGNOSIS — Z992 Dependence on renal dialysis: Secondary | ICD-10-CM | POA: Diagnosis not present

## 2023-04-12 DIAGNOSIS — D631 Anemia in chronic kidney disease: Secondary | ICD-10-CM | POA: Diagnosis not present

## 2023-04-12 DIAGNOSIS — Z23 Encounter for immunization: Secondary | ICD-10-CM | POA: Diagnosis not present

## 2023-04-12 DIAGNOSIS — N186 End stage renal disease: Secondary | ICD-10-CM | POA: Diagnosis not present

## 2023-04-12 NOTE — Progress Notes (Signed)
  Care Coordination Note  04/12/2023 Name: Shruthi Northrup MRN: [id number] DOB: 1971-01-19  Maymuna Detzel is a 52 y.o. year old female who is a primary care patient of Ladon Applebaum and is actively engaged with the care management team. I reached out to [employer] by phone today to assist with re-scheduling a follow up visit with the RN Case Manager  Follow up plan: Telephone appointment with care management team member scheduled for:04/18/23  [facility] Coordination Care Guide  Direct Dial: [phone]

## 2023-04-15 DIAGNOSIS — H25813 Combined forms of age-related cataract, bilateral: Secondary | ICD-10-CM | POA: Diagnosis not present

## 2023-04-15 DIAGNOSIS — D631 Anemia in chronic kidney disease: Secondary | ICD-10-CM | POA: Diagnosis not present

## 2023-04-15 DIAGNOSIS — N2581 Secondary hyperparathyroidism of renal origin: Secondary | ICD-10-CM | POA: Diagnosis not present

## 2023-04-15 DIAGNOSIS — Z23 Encounter for immunization: Secondary | ICD-10-CM | POA: Diagnosis not present

## 2023-04-15 DIAGNOSIS — Z992 Dependence on renal dialysis: Secondary | ICD-10-CM | POA: Diagnosis not present

## 2023-04-15 DIAGNOSIS — N186 End stage renal disease: Secondary | ICD-10-CM | POA: Diagnosis not present

## 2023-04-15 DIAGNOSIS — D509 Iron deficiency anemia, unspecified: Secondary | ICD-10-CM | POA: Diagnosis not present

## 2023-04-15 DIAGNOSIS — E113212 Type 2 diabetes mellitus with mild nonproliferative diabetic retinopathy with macular edema, left eye: Secondary | ICD-10-CM | POA: Diagnosis not present

## 2023-04-15 DIAGNOSIS — H35052 Retinal neovascularization, unspecified, left eye: Secondary | ICD-10-CM | POA: Diagnosis not present

## 2023-04-15 NOTE — Telephone Encounter (Signed)
Documentation from [employer] pt has medication assistance for Linzess through 12/17/2023. Documentation given to front staff for scan to chart. Pt made aware of this through her MyChart

## 2023-04-15 NOTE — Progress Notes (Unsigned)
   Compliance DL for OV w/ VS Verified by [facility] 04/15/2023 

## 2023-04-16 ENCOUNTER — Ambulatory Visit (INDEPENDENT_AMBULATORY_CARE_PROVIDER_SITE_OTHER): Payer: Medicare Other | Admitting: Pulmonary Disease

## 2023-04-16 ENCOUNTER — Encounter: Payer: Self-pay | Admitting: Pulmonary Disease

## 2023-04-16 VITALS — BP 136/82 | HR 83 | Ht 63.0 in | Wt 246.0 lb

## 2023-04-16 DIAGNOSIS — J849 Interstitial pulmonary disease, unspecified: Secondary | ICD-10-CM | POA: Diagnosis not present

## 2023-04-16 DIAGNOSIS — I10 Essential (primary) hypertension: Secondary | ICD-10-CM | POA: Diagnosis not present

## 2023-04-16 DIAGNOSIS — J4481 Bronchiolitis obliterans and bronchiolitis obliterans syndrome: Secondary | ICD-10-CM

## 2023-04-16 DIAGNOSIS — J9611 Chronic respiratory failure with hypoxia: Secondary | ICD-10-CM

## 2023-04-16 DIAGNOSIS — N186 End stage renal disease: Secondary | ICD-10-CM | POA: Diagnosis not present

## 2023-04-16 DIAGNOSIS — G4733 Obstructive sleep apnea (adult) (pediatric): Secondary | ICD-10-CM

## 2023-04-16 DIAGNOSIS — E782 Mixed hyperlipidemia: Secondary | ICD-10-CM | POA: Diagnosis not present

## 2023-04-16 DIAGNOSIS — Z992 Dependence on renal dialysis: Secondary | ICD-10-CM | POA: Diagnosis not present

## 2023-04-16 MED ORDER — AZELASTINE HCL 0.15 % NA SOLN: 1.0000 | Freq: Every evening | NASAL | 5 refills | Status: DC | PRN

## 2023-04-16 NOTE — Progress Notes (Addendum)
[facility]  Chief Complaint  Patient presents with   Follow-up    Has not been using CPAP over the past month due to GI issues at night. She has had some allergy symptoms- rhinits and watery eyes. Breathing is about the same since her last visit.     Constitutional:  BP 136/82 (BP Location: Left Arm, Cuff Size: Large)   Pulse 83   Ht 5\' 3"  (1.6 m)   Wt 246 lb (111.6 kg)   SpO2 100% Comment: 2lpm pulsed  BMI 43.58 kg/m   Past Medical History:  Anemia, OA, Breast cancer, DM, ESRD on iHD, GERD, HTN, COVID pneumonia January 2021  Past Surgical History:  She  has a past surgical history that includes Cholecystectomy; Cesarean section; Dilation and curettage of uterus; Abdominal hysterectomy; AV fistula placement (11/2014); Esophagogastroduodenoscopy (N/A, 07/10/2016); Balloon dilation (N/A, 07/10/2016); Colonoscopy with propofol (N/A, 09/27/2016); Mastectomy; Breast lumpectomy; ORIF ankle fracture (Right, 10/06/2018); External fixation removal (Right, 10/29/2018); AV fistula repair (N/A, 04/2021); Esophagogastroduodenoscopy (egd) with propofol (N/A, 11/21/2022); RIGHT/LEFT HEART CATH AND CORONARY ANGIOGRAPHY (N/A, 11/27/2022); Colonoscopy with propofol (N/A, 02/28/2023); and Colonoscopy (02/2023).  Brief Summary:  Patricia [name] is a 52 y.o. 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  She had viral respiratory infection in May 2022 and developed symptoms after this.      Subjective:   She has IBS and followed by GI.  Has more trouble with diarrhea after recent colonoscopy.  As a result it is difficult to use CPAP consistently over past couple of weeks.  She will need cataract surgery in June.  She has more sinus congestion, sneezing, runny nose, and crusty around eyes. Zyrtec helps, but gets more symptoms at night.  Singulair doesn't seem to help anymore.  She tried flonase  before but didn't help.  Not having cough, wheeze, or sputum.  Physical Exam:   Appearance - well kempt, wearing oxygen  ENMT - no sinus tenderness, no oral exudate, no LAN, Mallampati 4 airway, no stridor, clear nasal drainage  Respiratory - equal breath sounds bilaterally, no wheezing or rales  CV - s1s2 regular rate and rhythm, 2/6 systolic murmurs  Ext - no clubbing, no edema  Skin - no rashes  Psych - normal mood and affect      Pulmonary testing:  PFT 01/09/18 >> FEV1 1.60 (70%), FEV1% 89, TLC 3.26 (66%), DLCO 55% PFT 10/17/21 >> FEV1 1.28 (57%), FEV1% 89, TLC 3.28 (66%), DLCO 27% Serology 10/31/21 >> ANCA, RF negative; ESR 11 PFT 10/24/22 >> FEV1 1.51 (56%), FEV1% 86, TLC 3.56 (72%), DLCO 33%  Chest Imaging:  CT angio chest 06/26/21 >> mosaic attenuation of the airspaces, bandlike scarring at bases HRCT chest 10/11/21 >> mild, mosaic attenuation of airspaces, mild air trapping, scarring at bases CT angio chest 11/25/21 >> diffuse b/l GGO and consolidation concerning for pneumonia HRCT chest 10/30/22 >> very mild diffuse ground-glass attenuation and interlobular septal thickening, mild air trapping, patchy peripheral predominant areas of nodular airspace consolidation are also noted scattered throughout, mild septal thickening and subpleural reticulation V/Q scan 11/19/22 >> no PE  Sleep Tests:  CPAP 01/19/23 to 02/17/23 >> used on 10 of 30 nights with average 5 hrs per night.  Average AHI 1.2 with CPAP 11 cm H2O  Cardiac Tests:  Echo 11/19/22 >> EF 70 to 75%, mild LVH, grade 1 DD, mild AS RHC/LHC 11/27/22 >> normal coronaries, LVEDP 18, PCWP 30/28/22, PAP 76/21/43  Social History:  She  reports that she has never smoked. She has never used smokeless tobacco. She reports that she does not drink alcohol and does not use drugs.  Family History:  Her family history includes Diabetes Mellitus II in her mother; Heart block in her mother; Hypertension in her mother, sister,  and sister.     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 - intolerant of breztri - trial of prednisone and azithromycin didn't help - continue symbicort 160 two puffs bid - prn albuterol - she has a nebulizer machine  Allergic rhinitis with conjunctivitis. - continue zyrtec in the morning - try astepro at night - if improved, then she can stop singulair - she will call if she needs eye drops  Chronic respiratory failure with hypoxia. - 2 liters oxygen with exertion and sleep - gets supplies from [employer]  Obstructive sleep apnea. - she is compliant with CPAP and reports benefit from therapy - she uses [place] for her DME - continue CPAP 11 cm H2O  Obesity. - discussed importance of weight loss  ESRD on iHD. - followed by [facility]  Coronary artery disease, Chronic diastolic CHF, Pulmonary hypertension. - followed by Dr. Dietrich Pates with cardiology  Thyroid nodule. - followed by Dr. Fransico Him with Endocrinology  Irritable bowel syndrome. - followed by [facility]  Cataracts. - no pulmonary restrictions to her having surgery  Time Spent Involved in Patient Care on Day of Examination:  37 minutes  Follow up:   Patient Instructions  Astepro 1 spray in each nostril nightly as needed for allergies.  If you are feeling better after using astepro for a week, then you can try stopping singulair.  Follow up in 4 months.  Medication List:   Allergies as of 04/16/2023       Reactions   Amlodipine Besylate Rash, Other (See Comments)   dizziness   Reglan [metoclopramide] Other (See Comments)   hallucinations        Medication List        Accurate as of April 16, 2023  8:54 AM. If you have any questions, ask your nurse or doctor.          acetaminophen 325 MG tablet Commonly known as: TYLENOL Take 650 mg by mouth every 6 (six) hours as needed for mild pain  or fever.   albuterol (2.5 MG/3ML) 0.083% nebulizer solution Commonly known as: PROVENTIL Take 3 mLs (2.5 mg total) by nebulization every 6 (six) hours as needed for wheezing or shortness of breath.   albuterol 108 (90 Base) MCG/ACT inhaler Commonly known as: Ventolin HFA Inhale 2 puffs into the lungs every 6 (six) hours as needed for wheezing or shortness of breath.   aspirin EC 81 MG tablet Take 1 tablet (81 mg total) by mouth daily. Swallow whole.   Azelastine HCl 0.15 % Soln Commonly known as: Astepro Place 1 spray into the nose at bedtime as needed (allergies). Started by: Coralyn Helling, MD   cetirizine 10 MG tablet Commonly known as: ZYRTEC Take 1 tablet (10 mg total) by mouth daily.   cinacalcet 30 MG tablet Commonly known as: SENSIPAR Take 30 mg by mouth daily with breakfast.   dicyclomine 10 MG capsule Commonly known as: BENTYL Take 1 capsule (10 mg total) by mouth 2 (two) times daily as needed. What changed: reasons to take this   docusate sodium 100 MG capsule Commonly known as: COLACE Take 100 mg  by mouth daily.   ferric citrate 1 GM 210 MG(Fe) tablet Commonly known as: AURYXIA Take 420 mg by mouth 2 (two) times daily with a meal. With Breakfast & with supper   glipiZIDE 5 MG tablet Commonly known as: GLUCOTROL Take 1 tablet (5 mg total) by mouth 2 (two) times daily before a meal.   ipratropium 0.03 % nasal spray Commonly known as: ATROVENT Place 2 sprays into both nostrils 2 (two) times daily.   irbesartan 75 MG tablet Commonly known as: AVAPRO Take 75 mg by mouth daily.   labetalol 100 MG tablet Commonly known as: NORMODYNE Take 100 mg by mouth 2 (two) times daily.   lanthanum 500 MG chewable tablet Commonly known as: FOSRENOL Chew 1,000 mg by mouth 3 (three) times daily with meals.   linaclotide 290 MCG Caps capsule Commonly known as: LINZESS Take 290 mcg by mouth daily before breakfast.   montelukast 10 MG tablet Commonly known as:  SINGULAIR TAKE 1 TABLET BY MOUTH EVERYDAY AT BEDTIME What changed: See the new instructions.   multivitamin Tabs tablet Take 1 tablet by mouth daily.   NIFEdipine 30 MG 24 hr tablet Commonly known as: ADALAT CC Take 30 mg by mouth at bedtime.   nitroGLYCERIN 0.4 MG SL tablet Commonly known as: Nitrostat Place 1 tablet (0.4 mg total) under the tongue every 5 (five) minutes as needed for chest pain.   ondansetron 4 MG disintegrating tablet Commonly known as: ZOFRAN-ODT Take 1 tablet (4 mg total) by mouth every 8 (eight) hours as needed for nausea or vomiting.   OXYGEN Inhale 2 L into the lungs as needed (exertion).   pantoprazole 40 MG tablet Commonly known as: PROTONIX TAKE 1 TABLET (40 MG TOTAL) BY MOUTH DAILY 30 MINUTES BEFORE BREAKFAST   rOPINIRole 2 MG 24 hr tablet Commonly known as: REQUIP XL Take 2 mg by mouth at bedtime.   rosuvastatin 20 MG tablet Commonly known as: CRESTOR Take 20 mg by mouth daily.   sodium chloride 0.65 % Soln nasal spray Commonly known as: OCEAN Place 1 spray into both nostrils as needed for congestion.   Symbicort 160-4.5 MCG/ACT inhaler Generic drug: budesonide-formoterol Inhale 2 puffs into the lungs daily.   Evaristo Bury FlexTouch 200 UNIT/ML FlexTouch Pen Generic drug: insulin degludec Inject 20 Units into the skin at bedtime.   Vitamin D3 125 MCG (5000 UT) Caps TAKE 1 CAPSULE BY MOUTH EVERY DAY        Signature:  Coralyn Helling, MD [facility] Pulmonary/Critical Care Pager - [phone] 04/16/2023, 8:54 AM

## 2023-04-16 NOTE — Patient Instructions (Signed)
Astepro 1 spray in each nostril nightly as needed for allergies.  If you are feeling better after using astepro for a week, then you can try stopping singulair.  Follow up in 4 months.

## 2023-04-17 DIAGNOSIS — N2581 Secondary hyperparathyroidism of renal origin: Secondary | ICD-10-CM | POA: Diagnosis not present

## 2023-04-17 DIAGNOSIS — D631 Anemia in chronic kidney disease: Secondary | ICD-10-CM | POA: Diagnosis not present

## 2023-04-17 DIAGNOSIS — I9589 Other hypotension: Secondary | ICD-10-CM | POA: Diagnosis not present

## 2023-04-17 DIAGNOSIS — I953 Hypotension of hemodialysis: Secondary | ICD-10-CM | POA: Diagnosis not present

## 2023-04-17 DIAGNOSIS — N250 Renal osteodystrophy: Secondary | ICD-10-CM | POA: Diagnosis not present

## 2023-04-17 DIAGNOSIS — Z992 Dependence on renal dialysis: Secondary | ICD-10-CM | POA: Diagnosis not present

## 2023-04-17 DIAGNOSIS — D509 Iron deficiency anemia, unspecified: Secondary | ICD-10-CM | POA: Diagnosis not present

## 2023-04-17 DIAGNOSIS — N186 End stage renal disease: Secondary | ICD-10-CM | POA: Diagnosis not present

## 2023-04-18 ENCOUNTER — Encounter: Payer: Self-pay | Admitting: *Deleted

## 2023-04-18 ENCOUNTER — Ambulatory Visit: Payer: Self-pay | Admitting: *Deleted

## 2023-04-18 ENCOUNTER — Ambulatory Visit: Payer: Medicare Other | Admitting: Pulmonary Disease

## 2023-04-18 NOTE — Patient Outreach (Signed)
Care Coordination   Follow Up Visit Note   04/18/2023 Name: Patricia [name] MRN: [id number] DOB: [birth year]  Patricia [name] is a 52 y.o. year old female who sees [name], [place] for primary care. I spoke with  Temitope Muilenburg by phone today.  What matters to the patients health and wellness today?  Having cataract removed and managing blood sugar. Patient is being referred to retina specialist by Dr June Leap.    Goals Addressed             This Visit's Progress    Manage Blood Sugar   On track    Care Coordination Goals: Patient will follow-up with PCP and/or endocrinologist every 3 months or as recommended Patient will take medication as prescribed and reach out to provider with any negative side effects Patient will continue to monitor and record blood sugar 3 times per day and as needed and will call PCP or endocrinologist with any readings outside of recommended range Patient will take blood sugar log and meter to provider visits for review Patient will follow a modified carbohydrate diet and decrease simple carbohydrates and sugars Patient will increase activity level as tolerated with an ultimate goal of at least 150 minutes of exercise per week Patient will check feet daily for sores, wounds, calluses, etc and will notify provider of any abnormal findings Patient will have yearly eye exams to check for or monitor diabetic retinopathy Patient will reach out to RN Care Coordinator [phone] with any care coordination or resource needs      Manage Respiratory Conditions   On track    Care Coordination Goals: Patient will continue to use O2 at 2L via nasal canula when at rest and at 3L via nasal canula with activity Patient will continue to use CPAP at night for sleep apnea Patient will keep all follow-up appointments with PCP and specialists Patient will monitor oxygen saturation levels and reach out to provider with any readings outside of recommended range Patient will  reach out to DME company with any questions or concerns regarding oxygen tanks, concentrator, or CPAP machine Patient will state understanding of why she doesn't qualify for a portable oxygen concentrator at this time but will f/u with provider and DME company to see if she qualifies in the future Patient will call RN Care Coordinator [phone] with any care coordination or resource needs        SDOH assessments and interventions completed:  Yes SDOH Interventions Today    Flowsheet Row Most Recent Value  SDOH Interventions   Transportation Interventions Intervention Not Indicated  Financial Strain Interventions Intervention Not Indicated        Care Coordination Interventions:  Yes, provided  Interventions Today    Flowsheet Row Most Recent Value  Chronic Disease   Chronic disease during today's visit Diabetes, Chronic Obstructive Pulmonary Disease (COPD)  General Interventions   General Interventions Discussed/Reviewed General Interventions Discussed, General Interventions Reviewed, Labs, Annual Foot Exam, Annual Eye Exam, Durable Medical Equipment (DME), Doctor Visits  Labs Hgb A1c every 3 months, Kidney Function  Doctor Visits Discussed/Reviewed Doctor Visits Discussed, Specialist, Doctor Visits Reviewed, PCP  Durable Medical Equipment (DME) Glucomoter, Oxygen  PCP/Specialist Visits Compliance with follow-up visit  Education Interventions   Education Provided Provided Education  Provided Verbal Education On Eye Care, Medication, When to see the doctor, Mental Health/Coping with Illness, Blood Sugar Monitoring, Labs  Labs Reviewed Hgb A1c, Kidney Function  Pharmacy Interventions   Pharmacy Dicussed/Reviewed Medications and their functions, Pharmacy  Topics Discussed, Pharmacy Topics Reviewed  Safety Interventions   Safety Discussed/Reviewed Safety Discussed       Follow up plan: Follow up call scheduled for 05/30/23    Encounter Outcome:  Pt. Visit Completed    Demetrios Loll, BSN, RN-BC RN Care Coordinator [facility]  [facility] Direct Dial: [phone] Main #: [phone]

## 2023-04-19 DIAGNOSIS — N186 End stage renal disease: Secondary | ICD-10-CM | POA: Diagnosis not present

## 2023-04-19 DIAGNOSIS — Z992 Dependence on renal dialysis: Secondary | ICD-10-CM | POA: Diagnosis not present

## 2023-04-19 DIAGNOSIS — I9589 Other hypotension: Secondary | ICD-10-CM | POA: Diagnosis not present

## 2023-04-19 DIAGNOSIS — I953 Hypotension of hemodialysis: Secondary | ICD-10-CM | POA: Diagnosis not present

## 2023-04-19 DIAGNOSIS — D631 Anemia in chronic kidney disease: Secondary | ICD-10-CM | POA: Diagnosis not present

## 2023-04-19 DIAGNOSIS — N2581 Secondary hyperparathyroidism of renal origin: Secondary | ICD-10-CM | POA: Diagnosis not present

## 2023-04-22 ENCOUNTER — Telehealth: Payer: Self-pay | Admitting: *Deleted

## 2023-04-22 DIAGNOSIS — Z992 Dependence on renal dialysis: Secondary | ICD-10-CM | POA: Diagnosis not present

## 2023-04-22 DIAGNOSIS — N186 End stage renal disease: Secondary | ICD-10-CM | POA: Diagnosis not present

## 2023-04-22 DIAGNOSIS — J219 Acute bronchiolitis, unspecified: Secondary | ICD-10-CM

## 2023-04-22 DIAGNOSIS — N2581 Secondary hyperparathyroidism of renal origin: Secondary | ICD-10-CM | POA: Diagnosis not present

## 2023-04-22 DIAGNOSIS — I9589 Other hypotension: Secondary | ICD-10-CM | POA: Diagnosis not present

## 2023-04-22 DIAGNOSIS — D631 Anemia in chronic kidney disease: Secondary | ICD-10-CM | POA: Diagnosis not present

## 2023-04-22 DIAGNOSIS — I953 Hypotension of hemodialysis: Secondary | ICD-10-CM | POA: Diagnosis not present

## 2023-04-22 MED ORDER — MONTELUKAST SODIUM 10 MG PO TABS
10.0000 mg | ORAL_TABLET | Freq: Every day | ORAL | 0 refills | Status: AC
Start: 2023-04-22 — End: ?

## 2023-04-22 NOTE — Telephone Encounter (Signed)
Patricia [name] called and would like her Singular prescription sent over to [facility] in [place] instead of [facility].  Please switch. Patricia [name] does not need a call back unless more information is needed.

## 2023-04-22 NOTE — Telephone Encounter (Signed)
Called and spoke to patient. She is requesting refill on singulair. She stated that she has not started Astepro yet, as the pharmacy did not have it in stock. She is waiting for it to come in. Singulair sent to preferred pharmacy.  Nothing further needed.

## 2023-04-22 NOTE — Progress Notes (Signed)
[facility] - Clinic Note  04/30/2023   CHIEF COMPLAINT Patient presents for Retina Evaluation  HISTORY OF PRESENT ILLNESS: Patricia [name] is a 52 y.o. female who presents to the clinic today for:  HPI     Retina Evaluation   In both eyes.  I, the attending physician,  performed the HPI with the patient and updated documentation appropriately.        Comments   Patient here for Retina Evaluation. Referred by Dr June Leap. Patient states vision OD has a cataract. Dr saw something in retina area OS. Cataract surgery scheduled for June 7th OD. No eye pain.      Last edited by Rennis Chris, MD on 04/30/2023 12:51 PM.    Pt is here on the referral of Dr. June Leap for concern of PDR OU, pt used to see Dr. Dorette Grate at [facility], pt states she had laser procedures with him, but no injections, pt states Dr. June Leap is going to do cataract sx on both eyes   Referring physician: [name], MD [phone] [place],  [place] [address]  HISTORICAL INFORMATION:  Selected notes from the MEDICAL RECORD NUMBER Referred by Dr. June Leap for concern of exu ARMD LEE:  Ocular Hx- PMH-   CURRENT MEDICATIONS: No current outpatient medications on file. (Ophthalmic Drugs)   No current facility-administered medications for this visit. (Ophthalmic Drugs)   Current Outpatient Medications (Other)  Medication Sig   acetaminophen (TYLENOL) 325 MG tablet Take 650 mg by mouth every 6 (six) hours as needed for mild pain or fever.   albuterol (PROVENTIL) (2.5 MG/3ML) 0.083% nebulizer solution Take 3 mLs (2.5 mg total) by nebulization every 6 (six) hours as needed for wheezing or shortness of breath.   albuterol (VENTOLIN HFA) 108 (90 Base) MCG/ACT inhaler Inhale 2 puffs into the lungs every 6 (six) hours as needed for wheezing or shortness of breath.   aspirin EC 81 MG tablet Take 1 tablet (81 mg total) by mouth daily. Swallow whole.   Azelastine HCl (ASTEPRO) 0.15 % SOLN Place 1 spray  into the nose at bedtime as needed (allergies).   cetirizine (ZYRTEC) 10 MG tablet Take 1 tablet (10 mg total) by mouth daily.   Cholecalciferol (VITAMIN D3) 125 MCG (5000 UT) CAPS TAKE 1 CAPSULE BY MOUTH EVERY DAY   cinacalcet (SENSIPAR) 30 MG tablet Take 30 mg by mouth daily with breakfast.   dicyclomine (BENTYL) 10 MG capsule Take 1 capsule (10 mg total) by mouth 2 (two) times daily as needed. (Patient taking differently: Take 10 mg by mouth 2 (two) times daily as needed for spasms.)   docusate sodium (COLACE) 100 MG capsule Take 100 mg by mouth daily.   ferric citrate (AURYXIA) 1 GM 210 MG(Fe) tablet Take 420 mg by mouth 2 (two) times daily with a meal. With Breakfast & with supper   glipiZIDE (GLUCOTROL) 5 MG tablet Take 1 tablet (5 mg total) by mouth 2 (two) times daily before a meal.   insulin degludec (TRESIBA FLEXTOUCH) 200 UNIT/ML FlexTouch Pen Inject 20 Units into the skin at bedtime.   ipratropium (ATROVENT) 0.03 % nasal spray Place 2 sprays into both nostrils 2 (two) times daily.    irbesartan (AVAPRO) 75 MG tablet Take 75 mg by mouth daily.   labetalol (NORMODYNE) 100 MG tablet Take 100 mg by mouth 2 (two) times daily.   lanthanum (FOSRENOL) 500 MG chewable tablet Chew 1,000 mg by mouth 3 (three) times daily with meals.  linaclotide (LINZESS) 290 MCG CAPS capsule Take 290 mcg by mouth daily before breakfast.   montelukast (SINGULAIR) 10 MG tablet Take 1 tablet (10 mg total) by mouth at bedtime.   multivitamin (RENA-VIT) TABS tablet Take 1 tablet by mouth daily.   NIFEdipine (ADALAT CC) 30 MG 24 hr tablet Take 30 mg by mouth at bedtime.   nitroGLYCERIN (NITROSTAT) 0.4 MG SL tablet Place 1 tablet (0.4 mg total) under the tongue every 5 (five) minutes as needed for chest pain.   ondansetron (ZOFRAN-ODT) 4 MG disintegrating tablet Take 1 tablet (4 mg total) by mouth every 8 (eight) hours as needed for nausea or vomiting.   OXYGEN Inhale 2 L into the lungs as needed (exertion).    pantoprazole (PROTONIX) 40 MG tablet TAKE 1 TABLET (40 MG TOTAL) BY MOUTH DAILY 30 MINUTES BEFORE BREAKFAST   rOPINIRole (REQUIP XL) 2 MG 24 hr tablet Take 2 mg by mouth at bedtime.    rosuvastatin (CRESTOR) 20 MG tablet Take 20 mg by mouth daily.   sodium chloride (OCEAN) 0.65 % SOLN nasal spray Place 1 spray into both nostrils as needed for congestion.   SYMBICORT 160-4.5 MCG/ACT inhaler Inhale 2 puffs into the lungs daily.   No current facility-administered medications for this visit. (Other)   REVIEW OF SYSTEMS: ROS   Positive for: Endocrine, Eyes, Respiratory Last edited by Laddie Aquas, COA on 04/30/2023  8:19 AM.     ALLERGIES Allergies  Allergen Reactions   Amlodipine Besylate Rash and Other (See Comments)    dizziness   Reglan [Metoclopramide] Other (See Comments)    hallucinations    PAST MEDICAL HISTORY Past Medical History:  Diagnosis Date   Anemia    Ankle fracture    Arthritis    Blood transfusion without reported diagnosis    Breast cancer (HCC)    Cancer (HCC)    Diabetes mellitus without complication (HCC)    Dialysis patient (HCC)    mon, wed, friday,    End stage renal disease on dialysis (HCC)    M/W/F [facility] in [place]   GERD (gastroesophageal reflux disease)    Hypertension    OSA (obstructive sleep apnea)    uses CPAP sometimes   Pneumonia    PONV (postoperative nausea and vomiting)    Wears glasses    Past Surgical History:  Procedure Laterality Date   ABDOMINAL HYSTERECTOMY     AV FISTULA PLACEMENT  11/2014   at [facility] hospital   AV FISTULA REPAIR N/A 04/2021   BALLOON DILATION N/A 07/10/2016   Procedure: BALLOON DILATION;  Surgeon: West Bali, MD;  Location: [facility];  Service: Endoscopy;  Laterality: N/A;  Pyloric dilation   BREAST LUMPECTOMY     CESAREAN SECTION     CHOLECYSTECTOMY     COLONOSCOPY  02/2023   COLONOSCOPY WITH PROPOFOL N/A 09/27/2016   Dr. 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 the pylorus, gastritis on bx, normal examined duodenun   ESOPHAGOGASTRODUODENOSCOPY (EGD) WITH PROPOFOL N/A 11/21/2022   Procedure: ESOPHAGOGASTRODUODENOSCOPY (EGD) WITH PROPOFOL;  Surgeon: Dolores Frame, MD;  Location: [facility];  Service:  Gastroenterology;  Laterality: N/A;   EXTERNAL FIXATION REMOVAL Right 10/29/2018   Procedure: REMOVAL RIGHT ANKLE BIOMET ZIMMER EXTERNAL FIXATOR, SHORT LEG CAST APPLICATION;  Surgeon: Eldred Manges, MD;  Location: [facility] OR;  Service: Orthopedics;  Laterality: Right;   MASTECTOMY     left sided   ORIF ANKLE FRACTURE Right 10/06/2018   Procedure: OPEN REDUCTION INTERNAL FIXATION (ORIF) RIGHT ANKLE TRIMALLEOLAR;  Surgeon: Eldred Manges, MD;  Location: [facility] OR;  Service: Orthopedics;  Laterality: Right;   RIGHT/LEFT HEART CATH AND CORONARY ANGIOGRAPHY N/A 11/27/2022   Procedure: RIGHT/LEFT HEART CATH AND CORONARY ANGIOGRAPHY;  Surgeon: Swaziland, Peter M, MD;  Location: [facility] INVASIVE CV LAB;  Service: Cardiovascular;  Laterality: N/A;   FAMILY HISTORY Family History  Problem Relation Age of Onset   Diabetes Mellitus II Mother    Hypertension Mother    Heart block Mother    Hypertension Sister    Hypertension Sister    Colon cancer Neg Hx    SOCIAL HISTORY Social History   Tobacco Use   Smoking status: Never   Smokeless tobacco: Never  Vaping Use   Vaping Use: Never used  Substance Use Topics   Alcohol use: No    Alcohol/week: 0.0 standard drinks of alcohol   Drug use: No       OPHTHALMIC EXAM:  Base Eye Exam     Visual Acuity (Snellen - Linear)       Right Left   Dist [place] 20/150 -2 20/50  -1   Dist ph [place] 20/150 +2 20/30         Tonometry (Tonopen, 8:15 AM)       Right Left   Pressure 21 22         Pupils       Dark Light Shape React APD   Right 3 2 Round Slow None   Left 3 2 Round Slow None         Visual Fields (Counting fingers)       Left Right    Full Full         Extraocular Movement       Right Left    Full, Ortho Full, Ortho         Neuro/Psych     Oriented x3: Yes   Mood/Affect: Normal         Dilation     Both eyes: 1.0% Mydriacyl, 2.5% Phenylephrine @ 8:15 AM           Slit Lamp and Fundus Exam     Slit Lamp Exam       Right Left   Lids/Lashes Dermatochalasis - upper lid Dermatochalasis - upper lid   Conjunctiva/Sclera mild melanosis mild melanosis   Cornea mild arcus mild arcus, 1+ Punctate epithelial erosions   Anterior Chamber deep and clear deep and clear   Iris Round and dilated, No NVI Round and dilated, No NVI   Lens 2+ Nuclear sclerosis, 2-3+ Cortical cataract, 3+ Posterior subcapsular cataract 2-3+ Nuclear sclerosis, 2-3+ Cortical cataract   Anterior Vitreous mild syneresis, old white VH settled inferiorly mild syneresis         Fundus Exam       Right Left   Disc hazy view, Pink and Sharp, no NVD mild Pallor, Sharp rim, no NVD   C/D Ratio 0.2 0.3   Macula hazy view, Blunted foveal reflex, scattered MA Flat, Blunted foveal reflex, central edema and exudate, scattered MA   Vessels attenuated, mild tortuosity attenuated,  mild tortuosity, mild copper wiring   Periphery Attached, scattered PRP 360 with room for fill in, scattered MA/DBH, peripheral schisis nasally with old, white VH overlyng, red boat-shaped subhyaloid heme inferiorly Attached, 360 peripheral PRP with room for fill in, scattered patches of pre-retinal heme turning white           Refraction     Manifest Refraction (Auto)       Sphere Cylinder Axis Dist VA   Right -4.25 +1.50 010 20/150   Left -1.75 +0.75 010 20/30-2            IMAGING AND PROCEDURES  Imaging and Procedures for 04/30/2023  OCT, Retina - OU - Both Eyes       Right Eye Quality was good. Central Foveal Thickness: 268. Progression has no prior data. Findings include normal foveal contour, no SRF, intraretinal hyper-reflective material, intraretinal fluid (Focal IRHM and trace cystic changes temporal fovea, IT schisis).   Left Eye Quality was good. Central Foveal Thickness: 450. Progression has no prior data. Findings include no SRF, abnormal foveal contour, intraretinal hyper-reflective material, intraretinal fluid, vitreomacular adhesion (+IRF/IRHM/edema temporal fovea, IN schisis caught on widefield).   Notes *Images captured and stored on drive  Diagnosis / Impression:  +DME OU + peripheral retinoschisis OU OD: Focal IRHM and trace cystic changes temporal fovea, IT schisis OS: +IRF/IRHM/edema temporal fovea, IN schisis caught on widefield  Clinical management:  See below  Abbreviations: NFP - Normal foveal profile. CME - cystoid macular edema. PED - pigment epithelial detachment. IRF - intraretinal fluid. SRF - subretinal fluid. EZ - ellipsoid zone. ERM - epiretinal membrane. ORA - outer retinal atrophy. ORT - outer retinal tubulation. SRHM - subretinal hyper-reflective material. IRHM - intraretinal hyper-reflective material      Fluorescein Angiography Optos (Transit OS)       Right Eye Early phase findings include blockage, microaneurysm, vascular perfusion defect. Mid/Late phase findings include blockage, leakage, microaneurysm, vascular perfusion defect (Clusters of perivascular leakage inferior and temporal midzone -- ?early NV).   Left Eye Early phase findings include microaneurysm, vascular perfusion defect. Mid/Late phase findings include leakage, microaneurysm, retinal neovascularization, vascular perfusion defect (Clusters of NVE temporal midzone).   Notes **Images stored on drive**  Impression: PDR OU OD: Clusters of  perivascular leakage inferior and temporal midzone -- ?early NV OS: Clusters of NVE temporal midzone      Intravitreal Injection, Pharmacologic Agent - OS - Left Eye       Time Out 04/30/2023. 10:51 AM. Confirmed correct patient, procedure, site, and patient consented.   Anesthesia Topical anesthesia was used. Anesthetic medications included Lidocaine 2%, Proparacaine 0.5%.   Procedure Preparation included 5% betadine to ocular surface, eyelid speculum. A (32g) needle was used.   Injection: 1.25 mg Bevacizumab 1.25mg /0.15ml   Route: Intravitreal, Site: Left Eye   NDC: [id number], Lot: [id number], Expiration date: 07/11/2023   Post-op Post injection exam found visual acuity of at least counting fingers. The patient tolerated the procedure well. There were no complications. The patient received written and verbal post procedure care education.           ASSESSMENT/PLAN:   ICD-10-CM   1. Proliferative diabetic retinopathy of both eyes with macular edema associated with type 2 diabetes mellitus (HCC)  E11.3513 OCT, Retina - OU - Both Eyes    Intravitreal Injection, Pharmacologic Agent - OS - Left Eye    Bevacizumab (AVASTIN) SOLN 1.25 mg    2. Current use of insulin (HCC)  Z79.4  3. Essential hypertension  I10     4. Hypertensive retinopathy of both eyes  H35.033 Fluorescein Angiography Optos (Transit OS)    5. Combined forms of age-related cataract of both eyes  H25.813      1,2. Proliferative diabetic retinopathy, both eyes  - previously followed with Dr. Dorette Grate at [facility], but has been lost to f/u since 10.28.2020  - h/o PRP OU; has never received anti-VEGF injxns per report - The incidence, risk factors for progression, natural history and treatment options for diabetic retinopathy were discussed with patient.   - The need for close monitoring of blood glucose, blood pressure, and serum lipids, avoiding cigarette or any type of tobacco, and the need for Bruck term follow  up was also discussed with patient. - exam shows old white VH + some red subhyaloid heme OD; OS with focal clusters of preretinal heme turning white - FA (05.14.24) shows OD: Clusters of perivascular leakage inferior and temporal midzone -- ?early NV; OS: Clusters of NVE temporal midzone -- pt would benefit from fill in PRP OU (OS first) - OCT shows cystic changes /diabetic macular edema, both eyes  - The natural history, pathology, and characteristics of diabetic macular edema discussed with patient.  A generalized discussion of the major clinical trials concerning treatment of diabetic macular edema (ETDRS, DCT, SCORE, RISE / RIDE, and ongoing DRCR net studies) was completed.  This discussion included mention of the various approaches to treating diabetic macular edema (observation, laser photocoagulation, anti-VEGF injections with lucentis / Avastin / Eylea, steroid injections with Kenalog / Ozurdex, and intraocular surgery with vitrectomy).  The goal hemoglobin A1C of 6-7 was discussed, as well as importance of smoking cessation and hypertension control.  Need for ongoing treatment and monitoring were specifically discussed with reference to chronic nature of diabetic macular edema. - recommend IVA OU, but OS #1 first today, 05.14.24 - pt wishes to proceed - RBA of procedure discussed, questions answered - informed consent obtained and signed - see procedure note - f/u on Thursday -- DFE/OCT, IVA OD - f/u end of May for fill in PRP OS -- ?May 21 at 945  3. Retinoschisis OU  - peripheral retinoschisis confirmed by OCT  - OD -- inferotemporal periphery  - OS -- nasal periphery  - monitor  4,5. Hypertensive retinopathy OU - discussed importance of tight BP control - monitor  6. Mixed Cataract OU - The symptoms of cataract, surgical options, and treatments and risks were discussed with patient. - discussed diagnosis and progression - under the expert management of Dr. June Leap - clear from  a retina standpoint to proceed with cataract surgery when pt and surgeon are ready - recommend coordinating care so that pt receives anti-VEGF injection ~1-2 wks prior to cataract surgery  Ophthalmic Meds Ordered this visit:  Meds ordered this encounter  Medications   Bevacizumab (AVASTIN) SOLN 1.25 mg     Return in about 2 days (around 05/02/2023) for f/u PDR OU, DFE, OCT, possible injxn OD.  There are no Patient Instructions on file for this visit.  Explained the diagnoses, plan, and follow up with the patient and they expressed understanding.  Patient expressed understanding of the importance of proper follow up care.   This document serves as a record of services personally performed by Karie Chimera, MD, PhD. It was created on their behalf by Gerilyn Nestle, COT an ophthalmic technician. The creation of this record is the provider's dictation and/or activities during the visit.  Electronically signed by:  Gerilyn Nestle, COT  05.06.24 5:28 PM  This document serves as a record of services personally performed by Karie Chimera, MD, PhD. It was created on their behalf by Glee Arvin. Manson Passey, OA an ophthalmic technician. The creation of this record is the provider's dictation and/or activities during the visit.    Electronically signed by: Glee Arvin. Manson Passey, [place] 05.14.2024 5:28 PM  Karie Chimera, M.D., Ph.D. Diseases & Surgery of the Retina and Vitreous [facility] 04/30/2023  I have reviewed the above documentation for accuracy and completeness, and I agree with the above. Karie Chimera, M.D., Ph.D. 04/30/23 5:39 PM   Abbreviations: M myopia (nearsighted); A astigmatism; H hyperopia (farsighted); P presbyopia; Mrx spectacle prescription;  CTL contact lenses; OD right eye; OS left eye; OU both eyes  XT exotropia; ET esotropia; PEK punctate epithelial keratitis; PEE punctate epithelial erosions; DES dry eye syndrome; MGD meibomian gland dysfunction; ATs  artificial tears; PFAT's preservative free artificial tears; NSC nuclear sclerotic cataract; PSC posterior subcapsular cataract; ERM epi-retinal membrane; PVD posterior vitreous detachment; RD retinal detachment; DM diabetes mellitus; DR diabetic retinopathy; NPDR non-proliferative diabetic retinopathy; PDR proliferative diabetic retinopathy; CSME clinically significant macular edema; DME diabetic macular edema; dbh dot blot hemorrhages; CWS cotton wool spot; POAG primary open angle glaucoma; C/D cup-to-disc ratio; HVF humphrey visual field; GVF goldmann visual field; OCT optical coherence tomography; IOP intraocular pressure; BRVO Branch retinal vein occlusion; CRVO central retinal vein occlusion; CRAO central retinal artery occlusion; BRAO branch retinal artery occlusion; RT retinal tear; SB scleral buckle; PPV pars plana vitrectomy; VH Vitreous hemorrhage; PRP panretinal laser photocoagulation; IVK intravitreal kenalog; VMT vitreomacular traction; MH Macular hole;  NVD neovascularization of the disc; NVE neovascularization elsewhere; AREDS age related eye disease study; ARMD age related macular degeneration; POAG primary open angle glaucoma; EBMD epithelial/anterior basement membrane dystrophy; ACIOL anterior chamber intraocular lens; IOL intraocular lens; PCIOL posterior chamber intraocular lens; Phaco/IOL phacoemulsification with intraocular lens placement; PRK photorefractive keratectomy; LASIK laser assisted in situ keratomileusis; HTN hypertension; DM diabetes mellitus; COPD chronic obstructive pulmonary disease

## 2023-04-24 DIAGNOSIS — N186 End stage renal disease: Secondary | ICD-10-CM | POA: Diagnosis not present

## 2023-04-24 DIAGNOSIS — D631 Anemia in chronic kidney disease: Secondary | ICD-10-CM | POA: Diagnosis not present

## 2023-04-24 DIAGNOSIS — N2581 Secondary hyperparathyroidism of renal origin: Secondary | ICD-10-CM | POA: Diagnosis not present

## 2023-04-24 DIAGNOSIS — I953 Hypotension of hemodialysis: Secondary | ICD-10-CM | POA: Diagnosis not present

## 2023-04-24 DIAGNOSIS — Z992 Dependence on renal dialysis: Secondary | ICD-10-CM | POA: Diagnosis not present

## 2023-04-24 DIAGNOSIS — I9589 Other hypotension: Secondary | ICD-10-CM | POA: Diagnosis not present

## 2023-04-25 DIAGNOSIS — D631 Anemia in chronic kidney disease: Secondary | ICD-10-CM | POA: Diagnosis not present

## 2023-04-25 DIAGNOSIS — N2581 Secondary hyperparathyroidism of renal origin: Secondary | ICD-10-CM | POA: Diagnosis not present

## 2023-04-25 DIAGNOSIS — N186 End stage renal disease: Secondary | ICD-10-CM | POA: Diagnosis not present

## 2023-04-25 DIAGNOSIS — I9589 Other hypotension: Secondary | ICD-10-CM | POA: Diagnosis not present

## 2023-04-25 DIAGNOSIS — I953 Hypotension of hemodialysis: Secondary | ICD-10-CM | POA: Diagnosis not present

## 2023-04-25 DIAGNOSIS — Z992 Dependence on renal dialysis: Secondary | ICD-10-CM | POA: Diagnosis not present

## 2023-04-26 DIAGNOSIS — I9589 Other hypotension: Secondary | ICD-10-CM | POA: Diagnosis not present

## 2023-04-26 DIAGNOSIS — D631 Anemia in chronic kidney disease: Secondary | ICD-10-CM | POA: Diagnosis not present

## 2023-04-26 DIAGNOSIS — I953 Hypotension of hemodialysis: Secondary | ICD-10-CM | POA: Diagnosis not present

## 2023-04-26 DIAGNOSIS — N2581 Secondary hyperparathyroidism of renal origin: Secondary | ICD-10-CM | POA: Diagnosis not present

## 2023-04-26 DIAGNOSIS — Z992 Dependence on renal dialysis: Secondary | ICD-10-CM | POA: Diagnosis not present

## 2023-04-26 DIAGNOSIS — N186 End stage renal disease: Secondary | ICD-10-CM | POA: Diagnosis not present

## 2023-04-29 DIAGNOSIS — I9589 Other hypotension: Secondary | ICD-10-CM | POA: Diagnosis not present

## 2023-04-29 DIAGNOSIS — D631 Anemia in chronic kidney disease: Secondary | ICD-10-CM | POA: Diagnosis not present

## 2023-04-29 DIAGNOSIS — I953 Hypotension of hemodialysis: Secondary | ICD-10-CM | POA: Diagnosis not present

## 2023-04-29 DIAGNOSIS — N2581 Secondary hyperparathyroidism of renal origin: Secondary | ICD-10-CM | POA: Diagnosis not present

## 2023-04-29 DIAGNOSIS — Z992 Dependence on renal dialysis: Secondary | ICD-10-CM | POA: Diagnosis not present

## 2023-04-29 DIAGNOSIS — N186 End stage renal disease: Secondary | ICD-10-CM | POA: Diagnosis not present

## 2023-04-30 ENCOUNTER — Ambulatory Visit (INDEPENDENT_AMBULATORY_CARE_PROVIDER_SITE_OTHER): Payer: Medicare Other | Admitting: Ophthalmology

## 2023-04-30 ENCOUNTER — Encounter (INDEPENDENT_AMBULATORY_CARE_PROVIDER_SITE_OTHER): Payer: Self-pay | Admitting: Ophthalmology

## 2023-04-30 DIAGNOSIS — H35033 Hypertensive retinopathy, bilateral: Secondary | ICD-10-CM

## 2023-04-30 DIAGNOSIS — I10 Essential (primary) hypertension: Secondary | ICD-10-CM

## 2023-04-30 DIAGNOSIS — H3581 Retinal edema: Secondary | ICD-10-CM

## 2023-04-30 DIAGNOSIS — H25813 Combined forms of age-related cataract, bilateral: Secondary | ICD-10-CM | POA: Diagnosis not present

## 2023-04-30 DIAGNOSIS — E113513 Type 2 diabetes mellitus with proliferative diabetic retinopathy with macular edema, bilateral: Secondary | ICD-10-CM | POA: Diagnosis not present

## 2023-04-30 DIAGNOSIS — Z794 Long term (current) use of insulin: Secondary | ICD-10-CM | POA: Diagnosis not present

## 2023-04-30 DIAGNOSIS — H33103 Unspecified retinoschisis, bilateral: Secondary | ICD-10-CM | POA: Diagnosis not present

## 2023-04-30 MED ORDER — BEVACIZUMAB CHEMO INJECTION 1.25MG/0.05ML SYRINGE FOR KALEIDOSCOPE
1.2500 mg | INTRAVITREAL | Status: AC | PRN
Start: 2023-04-30 — End: 2023-04-30
  Administered 2023-04-30: 1.25 mg via INTRAVITREAL

## 2023-05-01 DIAGNOSIS — Z992 Dependence on renal dialysis: Secondary | ICD-10-CM | POA: Diagnosis not present

## 2023-05-01 DIAGNOSIS — I953 Hypotension of hemodialysis: Secondary | ICD-10-CM | POA: Diagnosis not present

## 2023-05-01 DIAGNOSIS — I9589 Other hypotension: Secondary | ICD-10-CM | POA: Diagnosis not present

## 2023-05-01 DIAGNOSIS — N186 End stage renal disease: Secondary | ICD-10-CM | POA: Diagnosis not present

## 2023-05-01 DIAGNOSIS — D631 Anemia in chronic kidney disease: Secondary | ICD-10-CM | POA: Diagnosis not present

## 2023-05-01 DIAGNOSIS — N2581 Secondary hyperparathyroidism of renal origin: Secondary | ICD-10-CM | POA: Diagnosis not present

## 2023-05-01 NOTE — Progress Notes (Signed)
[facility] - Clinic Note  05/02/2023   CHIEF COMPLAINT Patient presents for Retina Follow Up  HISTORY OF PRESENT ILLNESS: Patricia [name] is a 52 y.o. female who presents to the clinic today for:  HPI     Retina Follow Up   Patient presents with  Diabetic Retinopathy.  In both eyes.  This started 2 days ago.  Duration of 2 days.  Since onset it is stable.  I, the attending physician,  performed the HPI with the patient and updated documentation appropriately.        Comments   2 day retina follow up PDR and IVA OD pt is reporitng vision seems little blurred she denies any flashes or floaters her last bs was 130      Last edited by Rennis Chris, MD on 05/02/2023 11:17 AM.    Pt is here for IVA OD   Referring physician: [name], MD [address] [place],  [place] [address]  HISTORICAL INFORMATION:  Selected notes from the MEDICAL RECORD NUMBER Referred by Dr. June Leap for concern of exu ARMD LEE:  Ocular Hx- PMH-   CURRENT MEDICATIONS: No current outpatient medications on file. (Ophthalmic Drugs)   No current facility-administered medications for this visit. (Ophthalmic Drugs)   Current Outpatient Medications (Other)  Medication Sig   acetaminophen (TYLENOL) 325 MG tablet Take 650 mg by mouth every 6 (six) hours as needed for mild pain or fever.   albuterol (PROVENTIL) (2.5 MG/3ML) 0.083% nebulizer solution Take 3 mLs (2.5 mg total) by nebulization every 6 (six) hours as needed for wheezing or shortness of breath.   albuterol (VENTOLIN HFA) 108 (90 Base) MCG/ACT inhaler Inhale 2 puffs into the lungs every 6 (six) hours as needed for wheezing or shortness of breath.   aspirin EC 81 MG tablet Take 1 tablet (81 mg total) by mouth daily. Swallow whole.   Azelastine HCl (ASTEPRO) 0.15 % SOLN Place 1 spray into the nose at bedtime as needed (allergies).   cetirizine (ZYRTEC) 10 MG tablet Take 1 tablet (10 mg total) by mouth daily.   Cholecalciferol  (VITAMIN D3) 125 MCG (5000 UT) CAPS TAKE 1 CAPSULE BY MOUTH EVERY DAY   cinacalcet (SENSIPAR) 30 MG tablet Take 30 mg by mouth daily with breakfast.   dicyclomine (BENTYL) 10 MG capsule Take 1 capsule (10 mg total) by mouth 2 (two) times daily as needed. (Patient taking differently: Take 10 mg by mouth 2 (two) times daily as needed for spasms.)   docusate sodium (COLACE) 100 MG capsule Take 100 mg by mouth daily.   ferric citrate (AURYXIA) 1 GM 210 MG(Fe) tablet Take 420 mg by mouth 2 (two) times daily with a meal. With Breakfast & with supper   glipiZIDE (GLUCOTROL) 5 MG tablet Take 1 tablet (5 mg total) by mouth 2 (two) times daily before a meal.   insulin degludec (TRESIBA FLEXTOUCH) 200 UNIT/ML FlexTouch Pen Inject 20 Units into the skin at bedtime.   ipratropium (ATROVENT) 0.03 % nasal spray Place 2 sprays into both nostrils 2 (two) times daily.    irbesartan (AVAPRO) 75 MG tablet Take 75 mg by mouth daily.   labetalol (NORMODYNE) 100 MG tablet Take 100 mg by mouth 2 (two) times daily.   lanthanum (FOSRENOL) 500 MG chewable tablet Chew 1,000 mg by mouth 3 (three) times daily with meals.   linaclotide (LINZESS) 290 MCG CAPS capsule Take 290 mcg by mouth daily before breakfast.   montelukast (SINGULAIR) 10  MG tablet Take 1 tablet (10 mg total) by mouth at bedtime.   multivitamin (RENA-VIT) TABS tablet Take 1 tablet by mouth daily.   NIFEdipine (ADALAT CC) 30 MG 24 hr tablet Take 30 mg by mouth at bedtime.   nitroGLYCERIN (NITROSTAT) 0.4 MG SL tablet Place 1 tablet (0.4 mg total) under the tongue every 5 (five) minutes as needed for chest pain.   ondansetron (ZOFRAN-ODT) 4 MG disintegrating tablet Take 1 tablet (4 mg total) by mouth every 8 (eight) hours as needed for nausea or vomiting.   OXYGEN Inhale 2 L into the lungs as needed (exertion).   pantoprazole (PROTONIX) 40 MG tablet TAKE 1 TABLET (40 MG TOTAL) BY MOUTH DAILY 30 MINUTES BEFORE BREAKFAST   rOPINIRole (REQUIP XL) 2 MG 24 hr tablet  Take 2 mg by mouth at bedtime.    rosuvastatin (CRESTOR) 20 MG tablet Take 20 mg by mouth daily.   sodium chloride (OCEAN) 0.65 % SOLN nasal spray Place 1 spray into both nostrils as needed for congestion.   SYMBICORT 160-4.5 MCG/ACT inhaler Inhale 2 puffs into the lungs daily.   No current facility-administered medications for this visit. (Other)   REVIEW OF SYSTEMS: ROS   Positive for: Endocrine, Eyes, Respiratory Last edited by Etheleen Mayhew, COT on 05/02/2023  9:02 AM.      ALLERGIES Allergies  Allergen Reactions   Amlodipine Besylate Rash and Other (See Comments)    dizziness   Reglan [Metoclopramide] Other (See Comments)    hallucinations    PAST MEDICAL HISTORY Past Medical History:  Diagnosis Date   Anemia    Ankle fracture    Arthritis    Blood transfusion without reported diagnosis    Breast cancer (HCC)    Cancer (HCC)    Diabetes mellitus without complication (HCC)    Dialysis patient (HCC)    mon, wed, friday,    End stage renal disease on dialysis (HCC)    M/W/F [facility] in [place]   GERD (gastroesophageal reflux disease)    Hypertension    OSA (obstructive sleep apnea)    uses CPAP sometimes   Pneumonia    PONV (postoperative nausea and vomiting)    Wears glasses    Past Surgical History:  Procedure Laterality Date   ABDOMINAL HYSTERECTOMY     AV FISTULA PLACEMENT  11/2014   at [facility] hospital   AV FISTULA REPAIR N/A 04/2021   BALLOON DILATION N/A 07/10/2016   Procedure: BALLOON DILATION;  Surgeon: West Bali, MD;  Location: [facility];  Service: Endoscopy;  Laterality: N/A;  Pyloric dilation   BREAST LUMPECTOMY     CESAREAN SECTION     CHOLECYSTECTOMY     COLONOSCOPY  02/2023   COLONOSCOPY WITH PROPOFOL N/A 09/27/2016   Dr. Jena Gauss: Internal hemorrhoids repeat colonoscopy in 10 years   COLONOSCOPY WITH PROPOFOL N/A 02/28/2023   Procedure: COLONOSCOPY WITH PROPOFOL;  Surgeon: Corbin Ade, MD;  Location: [facility];   Service: Endoscopy;  Laterality: N/A;  11:45 am, asa 3 dialysis pt   DILATION AND CURETTAGE OF UTERUS     ESOPHAGOGASTRODUODENOSCOPY N/A 07/10/2016   Dr.Fields- normal esophagus, gastric stenosis was found at the pylorus, gastritis on bx, normal examined duodenun   ESOPHAGOGASTRODUODENOSCOPY (EGD) WITH PROPOFOL N/A 11/21/2022   Procedure: ESOPHAGOGASTRODUODENOSCOPY (EGD) WITH PROPOFOL;  Surgeon: Dolores Frame, MD;  Location: [facility];  Service: Gastroenterology;  Laterality: N/A;   EXTERNAL FIXATION REMOVAL Right 10/29/2018   Procedure: REMOVAL RIGHT ANKLE BIOMET  ZIMMER EXTERNAL FIXATOR, SHORT LEG CAST APPLICATION;  Surgeon: Eldred Manges, MD;  Location: [facility] OR;  Service: Orthopedics;  Laterality: Right;   MASTECTOMY     left sided   ORIF ANKLE FRACTURE Right 10/06/2018   Procedure: OPEN REDUCTION INTERNAL FIXATION (ORIF) RIGHT ANKLE TRIMALLEOLAR;  Surgeon: Eldred Manges, MD;  Location: [facility] OR;  Service: Orthopedics;  Laterality: Right;   RIGHT/LEFT HEART CATH AND CORONARY ANGIOGRAPHY N/A 11/27/2022   Procedure: RIGHT/LEFT HEART CATH AND CORONARY ANGIOGRAPHY;  Surgeon: Swaz[place], Peter M, MD;  Location: [facility] INVASIVE CV LAB;  Service: Cardiovascular;  Laterality: N/A;   FAMILY HISTORY Family History  Problem Relation Age of Onset   Diabetes Mellitus II Mother    Hypertension Mother    Heart block Mother    Hypertension Sister    Hypertension Sister    Colon cancer Neg Hx    SOCIAL HISTORY Social History   Tobacco Use   Smoking status: Never   Smokeless tobacco: Never  Vaping Use   Vaping Use: Never used  Substance Use Topics   Alcohol use: No    Alcohol/week: 0.0 standard drinks of alcohol   Drug use: No       OPHTHALMIC EXAM:  Base Eye Exam     Visual Acuity (Snellen - Linear)       Right Left   Dist [place] 20/150 -2 20/50   Dist ph [place] NI NI         Tonometry (Tonopen, 9:04 AM)       Right Left   Pressure 18 20         Pupils       Pupils Dark  Light Shape React APD   Right PERRL 3 2 Round Brisk None   Left PERRL 3 2 Round Brisk None         Visual Fields       Left Right    Full Full         Extraocular Movement       Right Left    Full, Ortho Full, Ortho         Neuro/Psych     Oriented x3: Yes   Mood/Affect: Normal         Dilation     Right eye: 1.0% Mydriacyl, 2.5% Phenylephrine @ 9:05 AM           Slit Lamp and Fundus Exam     Slit Lamp Exam       Right Left   Lids/Lashes Dermatochalasis - upper lid Dermatochalasis - upper lid   Conjunctiva/Sclera mild melanosis mild melanosis   Cornea mild arcus mild arcus, 1+ Punctate epithelial erosions   Anterior Chamber deep and clear deep and clear   Iris Round and dilated, No NVI Round and dilated, No NVI   Lens 2+ Nuclear sclerosis, 2-3+ Cortical cataract, 3+ Posterior subcapsular cataract 2-3+ Nuclear sclerosis, 2-3+ Cortical cataract   Anterior Vitreous mild syneresis, old white VH settled inferiorly mild syneresis         Fundus Exam       Right Left   Disc hazy view, Pink and Sharp, no NVD mild Pallor, Sharp rim, no NVD   C/D Ratio 0.2 0.3   Macula hazy view, Blunted foveal reflex, scattered MA Flat, Blunted foveal reflex, central edema and exudate, scattered MA   Vessels attenuated, mild tortuosity attenuated, mild tortuosity, mild copper wiring   Periphery Attached, scattered PRP 360 with room for fill in, scattered MA/DBH,  peripheral schisis nasally with old, white VH overlyng, red boat-shaped subhyaloid heme inferiorly Attached, 360 peripheral PRP with room for fill in, scattered patches of pre-retinal heme turning white           IMAGING AND PROCEDURES  Imaging and Procedures for 05/02/2023  OCT, Retina - OU - Both Eyes       Right Eye Quality was good. Central Foveal Thickness: 296. Progression has been stable. Findings include normal foveal contour, no SRF, intraretinal hyper-reflective material, intraretinal fluid (Focal  IRHM and trace cystic changes temporal fovea, IT schisis).   Left Eye Quality was good. Central Foveal Thickness: 436. Progression has improved. Findings include no SRF, abnormal foveal contour, intraretinal hyper-reflective material, intraretinal fluid, vitreomacular adhesion (Interval improvement in IRF/IRHM/edema temporal fovea; IN schisis caught on widefield).   Notes *Images captured and stored on drive  Diagnosis / Impression:  +DME OU + peripheral retinoschisis OU OD: Focal IRHM and trace cystic changes temporal fovea, IT schisis OS: Interval improvement in IRF/IRHM/edema temporal fovea; IN schisis caught on widefield  Clinical management:  See below  Abbreviations: NFP - Normal foveal profile. CME - cystoid macular edema. PED - pigment epithelial detachment. IRF - intraretinal fluid. SRF - subretinal fluid. EZ - ellipsoid zone. ERM - epiretinal membrane. ORA - outer retinal atrophy. ORT - outer retinal tubulation. SRHM - subretinal hyper-reflective material. IRHM - intraretinal hyper-reflective material      Intravitreal Injection, Pharmacologic Agent - OD - Right Eye       Time Out 05/02/2023. 9:27 AM. Confirmed correct patient, procedure, site, and patient consented.   Anesthesia Topical anesthesia was used. Anesthetic medications included Lidocaine 4%, Proparacaine 0.5%.   Procedure Preparation included 5% betadine to ocular surface, eyelid speculum. A (32g) needle was used.   Injection: 1.25 mg Bevacizumab 1.25mg /0.26ml   Route: Intravitreal, Site: Right Eye   NDC: [id number], Lot: [id number], Expiration date: 07/11/2023   Post-op Post injection exam found visual acuity of at least counting fingers. The patient tolerated the procedure well. There were no complications. The patient received written and verbal post procedure care education. Post injection medications were not given.           ASSESSMENT/PLAN:   ICD-10-CM   1. Proliferative diabetic retinopathy  of both eyes with macular edema associated with type 2 diabetes mellitus (HCC)  E11.3513 OCT, Retina - OU - Both Eyes    Intravitreal Injection, Pharmacologic Agent - OD - Right Eye    Bevacizumab (AVASTIN) SOLN 1.25 mg    2. 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 Essential hypertension  I10     5. Hypertensive retinopathy of both eyes  H35.033     6. Combined forms of age-related cataract of both eyes  H25.813      1,2. Proliferative diabetic retinopathy, both eyes  - s/p IVA OS #1 (05.14.24)  - previously followed with Dr. Dorette Grate at [facility], but has been lost to f/u since 10.28.2020  - h/o PRP OU; has never received anti-VEGF injxns per report - exam shows old white VH + some red subhyaloid heme OD; OS with focal clusters of preretinal heme turning white -- nasal and temporal periphery - FA (05.14.24) shows OD: Clusters of perivascular leakage inferior and temporal midzone -- ?early NV; OS: Clusters of NVE temporal midzone -- pt would benefit from fill in PRP OU (OS first) - OCT shows OD: Focal IRHM and trace cystic  changes temporal fovea, IT schisis; OS: Interval improvement in IRF/IRHM/edema temporal fovea; IN schisis caught on widefield  - recommend IVA OD #1 today, 05.16.24 - pt wishes to proceed - RBA of procedure discussed, questions answered - Avastin informed consent obtained and signed 05.14.24 (OU) - see procedure note - f/u May 21 at 945 for fill in PRP OS  3. Retinoschisis OU  - peripheral retinoschisis confirmed by OCT  - OD -- inferotemporal periphery  - OS -- nasal periphery  - monitor  4,5. Hypertensive retinopathy OU - discussed importance of tight BP control - monitor  6. Mixed Cataract OU - The symptoms of cataract, surgical options, and treatments and risks were discussed with patient. - discussed diagnosis and progression - under the expert management of Dr. June Leap - clear from a retina standpoint to proceed  with cataract surgery when pt and surgeon are ready - surgery scheduled for June 7  Ophthalmic Meds Ordered this visit:  Meds ordered this encounter  Medications   Bevacizumab (AVASTIN) SOLN 1.25 mg     Return in about 5 days (around 05/07/2023) for f/u PDR OU, DFE, OCT.  There are no Patient Instructions on file for this visit.  Explained the diagnoses, plan, and follow up with the patient and they expressed understanding.  Patient expressed understanding of the importance of proper follow up care.   This document serves as a record of services personally performed by Karie Chimera, MD, PhD. It was created on their behalf by Glee Arvin. Manson Passey, OA an ophthalmic technician. The creation of this record is the provider's dictation and/or activities during the visit.    Electronically signed by: Glee Arvin. Kristopher Oppenheim 05.15.2024 11:23 AM  Karie Chimera, M.D., Ph.D. Diseases & Surgery of the Retina and Vitreous [facility] 05/02/2023  I have reviewed the above documentation for accuracy and completeness, and I agree with the above. Karie Chimera, M.D., Ph.D. 05/02/23 11:23 AM  Abbreviations: M myopia (nearsighted); A astigmatism; H hyperopia (farsighted); P presbyopia; Mrx spectacle prescription;  CTL contact lenses; OD right eye; OS left eye; OU both eyes  XT exotropia; ET esotropia; PEK punctate epithelial keratitis; PEE punctate epithelial erosions; DES dry eye syndrome; MGD meibomian gland dysfunction; ATs artificial tears; PFAT's preservative free artificial tears; NSC nuclear sclerotic cataract; PSC posterior subcapsular cataract; ERM epi-retinal membrane; PVD posterior vitreous detachment; RD retinal detachment; DM diabetes mellitus; DR diabetic retinopathy; NPDR non-proliferative diabetic retinopathy; PDR proliferative diabetic retinopathy; CSME clinically significant macular edema; DME diabetic macular edema; dbh dot blot hemorrhages; CWS cotton wool spot; POAG  primary open angle glaucoma; C/D cup-to-disc ratio; HVF humphrey visual field; GVF goldmann visual field; OCT optical coherence tomography; IOP intraocular pressure; BRVO Branch retinal vein occlusion; CRVO central retinal vein occlusion; CRAO central retinal artery occlusion; BRAO branch retinal artery occlusion; RT retinal tear; SB scleral buckle; PPV pars plana vitrectomy; VH Vitreous hemorrhage; PRP panretinal laser photocoagulation; IVK intravitreal kenalog; VMT vitreomacular traction; MH Macular hole;  NVD neovascularization of the disc; NVE neovascularization elsewhere; AREDS age related eye disease study; ARMD age related macular degeneration; POAG primary open angle glaucoma; EBMD epithelial/anterior basement membrane dystrophy; ACIOL anterior chamber intraocular lens; IOL intraocular lens; PCIOL posterior chamber intraocular lens; Phaco/IOL phacoemulsification with intraocular lens placement; PRK photorefractive keratectomy; LASIK laser assisted in situ keratomileusis; HTN hypertension; DM diabetes mellitus; COPD chronic obstructive pulmonary disease

## 2023-05-02 ENCOUNTER — Encounter (INDEPENDENT_AMBULATORY_CARE_PROVIDER_SITE_OTHER): Payer: Self-pay | Admitting: Ophthalmology

## 2023-05-02 ENCOUNTER — Ambulatory Visit (INDEPENDENT_AMBULATORY_CARE_PROVIDER_SITE_OTHER): Payer: Medicare Other | Admitting: Ophthalmology

## 2023-05-02 DIAGNOSIS — H33103 Unspecified retinoschisis, bilateral: Secondary | ICD-10-CM

## 2023-05-02 DIAGNOSIS — H25813 Combined forms of age-related cataract, bilateral: Secondary | ICD-10-CM

## 2023-05-02 DIAGNOSIS — Z794 Long term (current) use of insulin: Secondary | ICD-10-CM

## 2023-05-02 DIAGNOSIS — H35033 Hypertensive retinopathy, bilateral: Secondary | ICD-10-CM | POA: Diagnosis not present

## 2023-05-02 DIAGNOSIS — E113513 Type 2 diabetes mellitus with proliferative diabetic retinopathy with macular edema, bilateral: Secondary | ICD-10-CM

## 2023-05-02 DIAGNOSIS — I10 Essential (primary) hypertension: Secondary | ICD-10-CM

## 2023-05-02 MED ORDER — BEVACIZUMAB CHEMO INJECTION 1.25MG/0.05ML SYRINGE FOR KALEIDOSCOPE
1.2500 mg | INTRAVITREAL | Status: AC | PRN
Start: 2023-05-02 — End: 2023-05-02
  Administered 2023-05-02: 1.25 mg via INTRAVITREAL

## 2023-05-03 DIAGNOSIS — Z992 Dependence on renal dialysis: Secondary | ICD-10-CM | POA: Diagnosis not present

## 2023-05-03 DIAGNOSIS — D631 Anemia in chronic kidney disease: Secondary | ICD-10-CM | POA: Diagnosis not present

## 2023-05-03 DIAGNOSIS — I9589 Other hypotension: Secondary | ICD-10-CM | POA: Diagnosis not present

## 2023-05-03 DIAGNOSIS — N186 End stage renal disease: Secondary | ICD-10-CM | POA: Diagnosis not present

## 2023-05-03 DIAGNOSIS — I953 Hypotension of hemodialysis: Secondary | ICD-10-CM | POA: Diagnosis not present

## 2023-05-03 DIAGNOSIS — N2581 Secondary hyperparathyroidism of renal origin: Secondary | ICD-10-CM | POA: Diagnosis not present

## 2023-05-06 DIAGNOSIS — I953 Hypotension of hemodialysis: Secondary | ICD-10-CM | POA: Diagnosis not present

## 2023-05-06 DIAGNOSIS — Z992 Dependence on renal dialysis: Secondary | ICD-10-CM | POA: Diagnosis not present

## 2023-05-06 DIAGNOSIS — N186 End stage renal disease: Secondary | ICD-10-CM | POA: Diagnosis not present

## 2023-05-06 DIAGNOSIS — I9589 Other hypotension: Secondary | ICD-10-CM | POA: Diagnosis not present

## 2023-05-06 DIAGNOSIS — D631 Anemia in chronic kidney disease: Secondary | ICD-10-CM | POA: Diagnosis not present

## 2023-05-06 DIAGNOSIS — N2581 Secondary hyperparathyroidism of renal origin: Secondary | ICD-10-CM | POA: Diagnosis not present

## 2023-05-06 NOTE — Progress Notes (Signed)
[facility] - Clinic Note  05/07/2023   CHIEF COMPLAINT Patient presents for Retina Follow Up  HISTORY OF PRESENT ILLNESS: Patricia [name] is a 52 y.o. female who presents to the clinic today for:  HPI     Retina Follow Up   Patient presents with  Diabetic Retinopathy.  In both eyes.  This started 5 days ago.  Duration of 5 days.  Since onset it is stable.  I, the attending physician,  performed the HPI with the patient and updated documentation appropriately.        Comments   5 day follow up and PRP OS pt is reporting vision seems about the same she denies any flashes or floaters       Last edited by Rennis Chris, MD on 05/07/2023 11:21 AM.    Pt is here for [facility] OS   Referring physician: [name], MD [address] [place],  [place] [address]  HISTORICAL INFORMATION:  Selected notes from the MEDICAL RECORD NUMBER Referred by Dr. June Leap for macular edema LEE:  Ocular Hx- PMH-   CURRENT MEDICATIONS: Current Outpatient Medications (Ophthalmic Drugs)  Medication Sig   prednisoLONE acetate (PRED FORTE) 1 % ophthalmic suspension Place 1 drop into the left eye 4 (four) times daily for 7 days.   No current facility-administered medications for this visit. (Ophthalmic Drugs)   Current Outpatient Medications (Other)  Medication Sig   acetaminophen (TYLENOL) 325 MG tablet Take 650 mg by mouth every 6 (six) hours as needed for mild pain or fever.   albuterol (PROVENTIL) (2.5 MG/3ML) 0.083% nebulizer solution Take 3 mLs (2.5 mg total) by nebulization every 6 (six) hours as needed for wheezing or shortness of breath.   albuterol (VENTOLIN HFA) 108 (90 Base) MCG/ACT inhaler Inhale 2 puffs into the lungs every 6 (six) hours as needed for wheezing or shortness of breath.   aspirin EC 81 MG tablet Take 1 tablet (81 mg total) by mouth daily. Swallow whole.   Azelastine HCl (ASTEPRO) 0.15 % SOLN Place 1 spray into the nose at bedtime as needed (allergies).    cetirizine (ZYRTEC) 10 MG tablet Take 1 tablet (10 mg total) by mouth daily.   Cholecalciferol (VITAMIN D3) 125 MCG (5000 UT) CAPS TAKE 1 CAPSULE BY MOUTH EVERY DAY   cinacalcet (SENSIPAR) 30 MG tablet Take 30 mg by mouth daily with breakfast.   dicyclomine (BENTYL) 10 MG capsule Take 1 capsule (10 mg total) by mouth 2 (two) times daily as needed. (Patient taking differently: Take 10 mg by mouth 2 (two) times daily as needed for spasms.)   docusate sodium (COLACE) 100 MG capsule Take 100 mg by mouth daily.   ferric citrate (AURYXIA) 1 GM 210 MG(Fe) tablet Take 420 mg by mouth 2 (two) times daily with a meal. With Breakfast & with supper   glipiZIDE (GLUCOTROL) 5 MG tablet Take 1 tablet (5 mg total) by mouth 2 (two) times daily before a meal.   insulin degludec (TRESIBA FLEXTOUCH) 200 UNIT/ML FlexTouch Pen Inject 20 Units into the skin at bedtime.   ipratropium (ATROVENT) 0.03 % nasal spray Place 2 sprays into both nostrils 2 (two) times daily.    irbesartan (AVAPRO) 75 MG tablet Take 75 mg by mouth daily.   labetalol (NORMODYNE) 100 MG tablet Take 100 mg by mouth 2 (two) times daily.   lanthanum (FOSRENOL) 500 MG chewable tablet Chew 1,000 mg by mouth 3 (three) times daily with meals.   linaclotide Karlene Einstein)  290 MCG CAPS capsule Take 290 mcg by mouth daily before breakfast.   montelukast (SINGULAIR) 10 MG tablet Take 1 tablet (10 mg total) by mouth at bedtime.   multivitamin (RENA-VIT) TABS tablet Take 1 tablet by mouth daily.   NIFEdipine (ADALAT CC) 30 MG 24 hr tablet Take 30 mg by mouth at bedtime.   nitroGLYCERIN (NITROSTAT) 0.4 MG SL tablet Place 1 tablet (0.4 mg total) under the tongue every 5 (five) minutes as needed for chest pain.   ondansetron (ZOFRAN-ODT) 4 MG disintegrating tablet Take 1 tablet (4 mg total) by mouth every 8 (eight) hours as needed for nausea or vomiting.   OXYGEN Inhale 2 L into the lungs as needed (exertion).   pantoprazole (PROTONIX) 40 MG tablet TAKE 1 TABLET (40 MG  TOTAL) BY MOUTH DAILY 30 MINUTES BEFORE BREAKFAST   rOPINIRole (REQUIP XL) 2 MG 24 hr tablet Take 2 mg by mouth at bedtime.    rosuvastatin (CRESTOR) 20 MG tablet Take 20 mg by mouth daily.   sodium chloride (OCEAN) 0.65 % SOLN nasal spray Place 1 spray into both nostrils as needed for congestion.   SYMBICORT 160-4.5 MCG/ACT inhaler Inhale 2 puffs into the lungs daily.   No current facility-administered medications for this visit. (Other)   REVIEW OF SYSTEMS: ROS   Positive for: Endocrine, Eyes, Respiratory Last edited by Etheleen Mayhew, COT on 05/07/2023  9:43 AM.     ALLERGIES Allergies  Allergen Reactions   Amlodipine Besylate Rash and Other (See Comments)    dizziness   Reglan [Metoclopramide] Other (See Comments)    hallucinations    PAST MEDICAL HISTORY Past Medical History:  Diagnosis Date   Anemia    Ankle fracture    Arthritis    Blood transfusion without reported diagnosis    Breast cancer (HCC)    Cancer (HCC)    Diabetes mellitus without complication (HCC)    Dialysis patient (HCC)    mon, wed, friday,    End stage renal disease on dialysis (HCC)    M/W/F [facility] in [place]   GERD (gastroesophageal reflux disease)    Hypertension    OSA (obstructive sleep apnea)    uses CPAP sometimes   Pneumonia    PONV (postoperative nausea and vomiting)    Wears glasses    Past Surgical History:  Procedure Laterality Date   ABDOMINAL HYSTERECTOMY     AV FISTULA PLACEMENT  11/2014   at [facility] hospital   AV FISTULA REPAIR N/A 04/2021   BALLOON DILATION N/A 07/10/2016   Procedure: BALLOON DILATION;  Surgeon: West Bali, MD;  Location: [facility];  Service: Endoscopy;  Laterality: N/A;  Pyloric dilation   BREAST LUMPECTOMY     CESAREAN SECTION     CHOLECYSTECTOMY     COLONOSCOPY  02/2023   COLONOSCOPY WITH PROPOFOL N/A 09/27/2016   Dr. Jena Gauss: Internal hemorrhoids repeat colonoscopy in 10 years   COLONOSCOPY WITH PROPOFOL N/A 02/28/2023   Procedure:  COLONOSCOPY WITH PROPOFOL;  Surgeon: Corbin Ade, MD;  Location: [facility];  Service: Endoscopy;  Laterality: N/A;  11:45 am, asa 3 dialysis pt   DILATION AND CURETTAGE OF UTERUS     ESOPHAGOGASTRODUODENOSCOPY N/A 07/10/2016   Dr.Fields- normal esophagus, gastric stenosis was found at the pylorus, gastritis on bx, normal examined duodenun   ESOPHAGOGASTRODUODENOSCOPY (EGD) WITH PROPOFOL N/A 11/21/2022   Procedure: ESOPHAGOGASTRODUODENOSCOPY (EGD) WITH PROPOFOL;  Surgeon: Dolores Frame, MD;  Location: [facility];  Service: Gastroenterology;  Laterality: N/A;   EXTERNAL FIXATION REMOVAL Right 10/29/2018   Procedure: REMOVAL RIGHT ANKLE BIOMET ZIMMER EXTERNAL FIXATOR, SHORT LEG CAST APPLICATION;  Surgeon: Eldred Manges, MD;  Location: [facility] OR;  Service: Orthopedics;  Laterality: Right;   MASTECTOMY     left sided   ORIF ANKLE FRACTURE Right 10/06/2018   Procedure: OPEN REDUCTION INTERNAL FIXATION (ORIF) RIGHT ANKLE TRIMALLEOLAR;  Surgeon: Eldred Manges, MD;  Location: [facility] OR;  Service: Orthopedics;  Laterality: Right;   RIGHT/LEFT HEART CATH AND CORONARY ANGIOGRAPHY N/A 11/27/2022   Procedure: RIGHT/LEFT HEART CATH AND CORONARY ANGIOGRAPHY;  Surgeon: Swazil[place], Peter M, MD;  Location[facility] INVASIVE CV LAB;  Service: Cardiovascular;  Laterality: N/A;   FAMILY HISTORY Family History  Problem Relation Age of Onset   Diabetes Mellitus II Mother    Hypertension Mother    Heart block Mother    Hypertension Sister    Hypertension Sister    Colon cancer Neg Hx    SOCIAL HISTORY Social History   Tobacco Use   Smoking status: Never   Smokeless tobacco: Never  Vaping Use   Vaping Use: Never used  Substance Use Topics   Alcohol use: No    Alcohol/week: 0.0 standard drinks of alcohol   Drug use: No       OPHTHALMIC EXAM:  Base Eye Exam     Visual Acuity (Snellen - Linear)       Right Left   Dist [place] 20/300 20/40 -1   Dist ph [place] 20/150 NI         Tonometry (Tonopen,  9:50 AM)       Right Left   Pressure 19 19         Pupils       Pupils Dark Light Shape React APD   Right PERRL 3 2 Round Brisk None   Left PERRL 3 2 Round Brisk None         Visual Fields       Left Right    Full Full         Extraocular Movement       Right Left    Full, Ortho Full, Ortho         Neuro/Psych     Oriented x3: Yes   Mood/Affect: Normal         Dilation     Both eyes: 2.5% Phenylephrine @ 9:50 AM           Slit Lamp and Fundus Exam     Slit Lamp Exam       Right Left   Lids/Lashes Dermatochalasis - upper lid Dermatochalasis - upper lid   Conjunctiva/Sclera mild melanosis mild melanosis   Cornea mild arcus mild arcus, 1+ Punctate epithelial erosions   Anterior Chamber deep and clear deep and clear   Iris Round and dilated, No NVI Round and dilated, No NVI   Lens 2+ Nuclear sclerosis, 2-3+ Cortical cataract, 3+ Posterior subcapsular cataract 2-3+ Nuclear sclerosis, 2-3+ Cortical cataract   Anterior Vitreous mild syneresis, old white VH settled inferiorly mild syneresis         Fundus Exam       Right Left   Disc hazy view, Pink and Sharp, no NVD mild Pallor, Sharp rim, no NVD   C/D Ratio 0.2 0.3   Macula hazy view, Blunted foveal reflex, scattered MA Flat, Blunted foveal reflex, central edema and exudate, scattered MA   Vessels attenuated, mild tortuosity attenuated, mild tortuosity, mild copper wiring  Periphery Attached, scattered PRP 360 with room for fill in, scattered MA/DBH, peripheral schisis nasally with old, white VH overlyng, red boat-shaped subhyaloid heme inferiorly Attached, 360 peripheral PRP with room for fill in, scattered patches of pre-retinal heme turning white           IMAGING AND PROCEDURES  Imaging and Procedures for 05/07/2023  OCT, Retina - OU - Both Eyes       Right Eye Quality was good. Central Foveal Thickness: 265. Progression has improved. Findings include normal foveal contour, no SRF,  intraretinal hyper-reflective material, intraretinal fluid (Focal IRHM and trace cystic changes temporal fovea -- slightly improved; IT schisis - stable).   Left Eye Quality was good. Central Foveal Thickness: 439. Progression has been stable. Findings include no SRF, abnormal foveal contour, intraretinal hyper-reflective material, intraretinal fluid, vitreomacular adhesion (persistent IRF/IRHM/edema temporal fovea; IN schisis caught on widefield).   Notes *Images captured and stored on drive  Diagnosis / Impression:  +DME OU + peripheral retinoschisis OU OD: Focal IRHM and trace cystic changes temporal fovea -- slightly improved; IT schisis - stable OS: persistent IRF/IRHM/edema temporal fovea; IN schisis caught on widefield  Clinical management:  See below  Abbreviations: NFP - Normal foveal profile. CME - cystoid macular edema. PED - pigment epithelial detachment. IRF - intraretinal fluid. SRF - subretinal fluid. EZ - ellipsoid zone. ERM - epiretinal membrane. ORA - outer retinal atrophy. ORT - outer retinal tubulation. SRHM - subretinal hyper-reflective material. IRHM - intraretinal hyper-reflective material      Panretinal Photocoagulation - OS - Left Eye       LASER PROCEDURE NOTE  Diagnosis:   Proliferative Diabetic Retinopathy, LEFT EYE  Procedure:  Pan-retinal photocoagulation using slit lamp laser, LEFT EYE, fill-in  Anesthesia:  Topical  Surgeon: Rennis Chris, MD, PhD   Informed consent obtained, operative eye marked, and time out performed prior to initiation of laser.   Lumenis [id number] slit lamp laser Pattern: 3x3 square Power: 300 mW Duration: 30 msec  Spot size: 200 microns  # spots: 1540 spots 360 fill-in  Complications: None.  RTC: wk of June 17th -- DFE/OCT, possible injxns  Patient tolerated the procedure well and received written and verbal post-procedure care information/education.           ASSESSMENT/PLAN:   ICD-10-CM   1.  Proliferative diabetic retinopathy of both eyes with macular edema associated with type 2 diabetes mellitus (HCC)  E11.3513 OCT, Retina - OU - Both Eyes    Panretinal Photocoagulation - OS - Left Eye    2. 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 Essential hypertension  I10     5. Hypertensive retinopathy of both eyes  H35.033     6. Combined forms of age-related cataract of both eyes  H25.813      1,2. Proliferative diabetic retinopathy, both eyes  - s/p IVA OS #1 (05.14.24)  - s/p IVA OD #1 (05.16.24)  - previously followed with Dr. Dorette Grate at [facility], but has been lost to f/u since 10.28.2020  - h/o PRP OU; had never received anti-VEGF injxns prior to here, per report - exam shows old white VH + some red subhyaloid heme OD; OS with focal clusters of preretinal heme turning white -- nasal and temporal periphery - FA (05.14.24) shows OD: Clusters of perivascular leakage inferior and temporal midzone -- ?early NV; OS: Clusters of NVE temporal midzone -- pt would benefit from  fill in PRP OU (OS first) - OCT shows OD: Focal IRHM and trace cystic changes temporal fovea--slightly improved, IT schisis; OS: persistent IRF/IRHM/edema temporal fovea; IN schisis caught on widefield  - recommend PRP OS today, 05.21.24 - pt wishes to proceed with laser - RBA of procedure discussed, questions answered - see procedure note - start PF QID OS x7 days - Avastin informed consent obtained and signed 05.14.24 (OU) - f/u week of June 17, DFE, OCT, possible injection(s)  3. Retinoschisis OU  - peripheral retinoschisis confirmed by OCT  - OD -- inferotemporal periphery  - OS -- nasal periphery  - monitor  4,5. Hypertensive retinopathy OU - discussed importance of tight BP control - monitor  6. Mixed Cataract OU - The symptoms of cataract, surgical options, and treatments and risks were discussed with patient. - discussed diagnosis and progression - under the  expert management of Dr. June Leap - clear from a retina standpoint to proceed with cataract surgery when pt and surgeon are ready - surgery scheduled for June 7  Ophthalmic Meds Ordered this visit:  Meds ordered this encounter  Medications   prednisoLONE acetate (PRED FORTE) 1 % ophthalmic suspension    Sig: Place 1 drop into the left eye 4 (four) times daily for 7 days.    Dispense:  5 mL    Refill:  0     Return for f/u week of June 17, PDR OU, DFE, OCT, Possible Injxn.  There are no Patient Instructions on file for this visit.  Explained the diagnoses, plan, and follow up with the patient and they expressed understanding.  Patient expressed understanding of the importance of proper follow up care.   This document serves as a record of services personally performed by Karie Chimera, MD, PhD. It was created on their behalf by Gerilyn Nestle, COT an ophthalmic technician. The creation of this record is the provider's dictation and/or activities during the visit.    Electronically signed by:  Gerilyn Nestle, COT  05/06/23 11:39 AM  This document serves as a record of services personally performed by Karie Chimera, MD, PhD. It was created on their behalf by Glee Arvin. Manson Passey, OA an ophthalmic technician. The creation of this record is the provider's dictation and/or activities during the visit.    Electronically signed by: Glee Arvin. Manson Passey, [place] 05.21.2024 11:39 AM  Karie Chimera, M.D., Ph.D. Diseases & Surgery of the Retina and Vitreous [facility] 05/07/2023  I have reviewed the above documentation for accuracy and completeness, and I agree with the above. Karie Chimera, M.D., Ph.D. 05/07/23 11:39 AM   Abbreviations: M myopia (nearsighted); A astigmatism; H hyperopia (farsighted); P presbyopia; Mrx spectacle prescription;  CTL contact lenses; OD right eye; OS left eye; OU both eyes  XT exotropia; ET esotropia; PEK punctate epithelial keratitis; PEE  punctate epithelial erosions; DES dry eye syndrome; MGD meibomian gland dysfunction; ATs artificial tears; PFAT's preservative free artificial tears; NSC nuclear sclerotic cataract; PSC posterior subcapsular cataract; ERM epi-retinal membrane; PVD posterior vitreous detachment; RD retinal detachment; DM diabetes mellitus; DR diabetic retinopathy; NPDR non-proliferative diabetic retinopathy; PDR proliferative diabetic retinopathy; CSME clinically significant macular edema; DME diabetic macular edema; dbh dot blot hemorrhages; CWS cotton wool spot; POAG primary open angle glaucoma; C/D cup-to-disc ratio; HVF humphrey visual field; GVF goldmann visual field; OCT optical coherence tomography; IOP intraocular pressure; BRVO Branch retinal vein occlusion; CRVO central retinal vein occlusion; CRAO central retinal artery occlusion; BRAO branch retinal  artery occlusion; RT retinal tear; SB scleral buckle; PPV pars plana vitrectomy; VH Vitreous hemorrhage; PRP panretinal laser photocoagulation; IVK intravitreal kenalog; VMT vitreomacular traction; MH Macular hole;  NVD neovascularization of the disc; NVE neovascularization elsewhere; AREDS age related eye disease study; ARMD age related macular degeneration; POAG primary open angle glaucoma; EBMD epithelial/anterior basement membrane dystrophy; ACIOL anterior chamber intraocular lens; IOL intraocular lens; PCIOL posterior chamber intraocular lens; Phaco/IOL phacoemulsification with intraocular lens placement; PRK photorefractive keratectomy; LASIK laser assisted in situ keratomileusis; HTN hypertension; DM diabetes mellitus; COPD chronic obstructive pulmonary disease

## 2023-05-07 ENCOUNTER — Encounter (INDEPENDENT_AMBULATORY_CARE_PROVIDER_SITE_OTHER): Payer: Self-pay | Admitting: Ophthalmology

## 2023-05-07 ENCOUNTER — Ambulatory Visit (INDEPENDENT_AMBULATORY_CARE_PROVIDER_SITE_OTHER): Payer: Medicare Other | Admitting: Ophthalmology

## 2023-05-07 DIAGNOSIS — Z794 Long term (current) use of insulin: Secondary | ICD-10-CM | POA: Diagnosis not present

## 2023-05-07 DIAGNOSIS — H35033 Hypertensive retinopathy, bilateral: Secondary | ICD-10-CM | POA: Diagnosis not present

## 2023-05-07 DIAGNOSIS — E113513 Type 2 diabetes mellitus with proliferative diabetic retinopathy with macular edema, bilateral: Secondary | ICD-10-CM | POA: Diagnosis not present

## 2023-05-07 DIAGNOSIS — I10 Essential (primary) hypertension: Secondary | ICD-10-CM

## 2023-05-07 DIAGNOSIS — H33103 Unspecified retinoschisis, bilateral: Secondary | ICD-10-CM

## 2023-05-07 DIAGNOSIS — H25813 Combined forms of age-related cataract, bilateral: Secondary | ICD-10-CM

## 2023-05-07 MED ORDER — PREDNISOLONE ACETATE 1 % OP SUSP: 1.0000 [drp] | Freq: Four times a day (QID) | OPHTHALMIC | 0 refills | Status: AC

## 2023-05-08 DIAGNOSIS — D631 Anemia in chronic kidney disease: Secondary | ICD-10-CM | POA: Diagnosis not present

## 2023-05-08 DIAGNOSIS — I9589 Other hypotension: Secondary | ICD-10-CM | POA: Diagnosis not present

## 2023-05-08 DIAGNOSIS — N2581 Secondary hyperparathyroidism of renal origin: Secondary | ICD-10-CM | POA: Diagnosis not present

## 2023-05-08 DIAGNOSIS — Z992 Dependence on renal dialysis: Secondary | ICD-10-CM | POA: Diagnosis not present

## 2023-05-08 DIAGNOSIS — N186 End stage renal disease: Secondary | ICD-10-CM | POA: Diagnosis not present

## 2023-05-08 DIAGNOSIS — I953 Hypotension of hemodialysis: Secondary | ICD-10-CM | POA: Diagnosis not present

## 2023-05-10 DIAGNOSIS — D631 Anemia in chronic kidney disease: Secondary | ICD-10-CM | POA: Diagnosis not present

## 2023-05-10 DIAGNOSIS — N186 End stage renal disease: Secondary | ICD-10-CM | POA: Diagnosis not present

## 2023-05-10 DIAGNOSIS — N2581 Secondary hyperparathyroidism of renal origin: Secondary | ICD-10-CM | POA: Diagnosis not present

## 2023-05-10 DIAGNOSIS — I9589 Other hypotension: Secondary | ICD-10-CM | POA: Diagnosis not present

## 2023-05-10 DIAGNOSIS — I953 Hypotension of hemodialysis: Secondary | ICD-10-CM | POA: Diagnosis not present

## 2023-05-10 DIAGNOSIS — Z992 Dependence on renal dialysis: Secondary | ICD-10-CM | POA: Diagnosis not present

## 2023-05-13 DIAGNOSIS — N186 End stage renal disease: Secondary | ICD-10-CM | POA: Diagnosis not present

## 2023-05-13 DIAGNOSIS — I9589 Other hypotension: Secondary | ICD-10-CM | POA: Diagnosis not present

## 2023-05-13 DIAGNOSIS — D631 Anemia in chronic kidney disease: Secondary | ICD-10-CM | POA: Diagnosis not present

## 2023-05-13 DIAGNOSIS — Z992 Dependence on renal dialysis: Secondary | ICD-10-CM | POA: Diagnosis not present

## 2023-05-13 DIAGNOSIS — I953 Hypotension of hemodialysis: Secondary | ICD-10-CM | POA: Diagnosis not present

## 2023-05-13 DIAGNOSIS — N2581 Secondary hyperparathyroidism of renal origin: Secondary | ICD-10-CM | POA: Diagnosis not present

## 2023-05-14 ENCOUNTER — Ambulatory Visit: Payer: Medicare Other | Admitting: Pulmonary Disease

## 2023-05-15 DIAGNOSIS — N186 End stage renal disease: Secondary | ICD-10-CM | POA: Diagnosis not present

## 2023-05-15 DIAGNOSIS — I953 Hypotension of hemodialysis: Secondary | ICD-10-CM | POA: Diagnosis not present

## 2023-05-15 DIAGNOSIS — Z992 Dependence on renal dialysis: Secondary | ICD-10-CM | POA: Diagnosis not present

## 2023-05-15 DIAGNOSIS — D631 Anemia in chronic kidney disease: Secondary | ICD-10-CM | POA: Diagnosis not present

## 2023-05-15 DIAGNOSIS — N2581 Secondary hyperparathyroidism of renal origin: Secondary | ICD-10-CM | POA: Diagnosis not present

## 2023-05-15 DIAGNOSIS — I9589 Other hypotension: Secondary | ICD-10-CM | POA: Diagnosis not present

## 2023-05-17 ENCOUNTER — Encounter (HOSPITAL_COMMUNITY): Payer: Self-pay

## 2023-05-17 ENCOUNTER — Encounter (HOSPITAL_COMMUNITY)
Admission: RE | Admit: 2023-05-17 | Discharge: 2023-05-17 | Disposition: A | Discharge status: Home / Self Care | Payer: Medicare Other | Source: Ambulatory Visit | Attending: Ophthalmology | Admitting: Ophthalmology

## 2023-05-17 DIAGNOSIS — D631 Anemia in chronic kidney disease: Secondary | ICD-10-CM | POA: Diagnosis not present

## 2023-05-17 DIAGNOSIS — Z992 Dependence on renal dialysis: Secondary | ICD-10-CM | POA: Diagnosis not present

## 2023-05-17 DIAGNOSIS — N186 End stage renal disease: Secondary | ICD-10-CM | POA: Diagnosis not present

## 2023-05-17 DIAGNOSIS — I953 Hypotension of hemodialysis: Secondary | ICD-10-CM | POA: Diagnosis not present

## 2023-05-17 DIAGNOSIS — N2581 Secondary hyperparathyroidism of renal origin: Secondary | ICD-10-CM | POA: Diagnosis not present

## 2023-05-17 DIAGNOSIS — I9589 Other hypotension: Secondary | ICD-10-CM | POA: Diagnosis not present

## 2023-05-17 HISTORY — DX: Unspecified asthma, uncomplicated: J45.909

## 2023-05-17 HISTORY — DX: COVID-19: U07.1

## 2023-05-17 HISTORY — DX: Dyspnea, unspecified: R06.00

## 2023-05-17 NOTE — H&P (Signed)
Surgical History & Physical  Patient Name: Patricia [name] DOB: 06-18-1971  Surgery: Cataract extraction with intraocular lens implant phacoemulsification; Right Eye  Surgeon: [name] MD Surgery Date:  05-24-23 Pre-Op Date:  05-06-23  HPI: A 2 Yr. old female patient 1. The patient complains of seeing a "tree branch" in vision, which began 1 week ago. The right eye is affected. The episode is constant. No changes since it first started. She was seeing a doctor at [facility] but she can't remember why, she thinks they said glaucoma but she never started treatment. She is diabetic. Denies any problems with OS. Patient states after she had COVID 2019, she has been having lung problems. She had an episode recently and had to start a steroid about a month ago. Her BP elevated and BS spiked. She had to start insulin and stop the steroid. She is now on oxygen qhs with a C-pap. She has been on dialysis since 2016. The vision has become very blurry in the right eye, and she has trouble seeing anything clearly at distance or at near. This is negatively affecting the patient's quality of life and the patient is unable to function adequately in life with the current level of vision. HPI Completed by Dr. [name]  Medical History: Glaucoma Cataracts Arthritis Cancer Diabetes High Blood Pressure LDL Lung Problems Thyroid Problems  Review of Systems Cardiovascular High Blood Pressure Respiratory on Oxygen qhs All recorded systems are negative except as noted above.  Social   Never smoked   Medication Albuterol, Aspirin, Cetirizine, Cinacalcet, Dicyclomine, Auryxia, Glipizide, Ipratropium Bromide, Irbesartan, Labetolol, Lanthanum, Linaclotide, Montelukast, Niefdipine, Nitroglycerin, Oxygen, Pantoprazole, Rosuvastatin, Symbicort Inhalant, Tresiba,   Sx/Procedures  Tubal Ligation, Fistula x2, Ankle Surgery, Lumpectomy x2, Mastectomy, Heart Catherization, Gallbladder Sx,   Drug  Allergies  Reglan, Amlodipine,   History & Physical: Heent: cataract, right eye NECK: supple without bruits LUNGS: lungs clear to auscultation CV: regular rate and rhythm Abdomen: soft and non-tender Impression & Plan: Assessment: 1.  COMBINED FORMS AGE RELATED CATARACT; Both Eyes (H25.813) 2.  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  DM Type 2; Left Mild With ME (O13.0865) 4.  BLEPHARITIS; Right Upper Lid, Right Lower Lid, Left Upper Lid, Left Lower Lid (H01.001, H01.002,H01.004,H01.005) 5.  DRY EYE SYNDROME/TEAR FILM INSUFFICIENCY; Both Eyes (H04.123) 6.  Ocular Hypertension; Both Eyes (H40.053)  Plan: 1.  Cataract accounts for the patient's decreased vision. This visual impairment is not correctable with a tolerable change in glasses or contact lenses. Cataract surgery with an implantation of a new lens should significantly improve the visual and functional status of the patient. Discussed all risks, benefits, alternatives, and potential complications. Discussed the procedures and recovery. Patient desires to have surgery. A-scan ordered and performed today for intra-ocular lens calculations. The surgery will be performed in order to improve vision for driving, reading, and for eye examinations. Recommend phacoemulsification with intra-ocular lens. Recommend Dextenza for post-operative pain and inflammation. Right Eye first and only. Dilates well - shugarcaine by protocol.  2.  solid change in fovea. Unclear etiology. Will refer to retinal for eval and possible treatment.  3.  Vs. other cause of intra-retinal macular change. as above.  4.  Recommend regular lid cleaning. Warm compresses 7-10 minutes every day, both eyes.  5.  Preservative Free Artificial tears 1 drop 2-3x/day.  6.  difference between applanation and tonopen. Will continue to monitor.

## 2023-05-18 DIAGNOSIS — N250 Renal osteodystrophy: Secondary | ICD-10-CM | POA: Diagnosis not present

## 2023-05-18 DIAGNOSIS — Z992 Dependence on renal dialysis: Secondary | ICD-10-CM | POA: Diagnosis not present

## 2023-05-18 DIAGNOSIS — N186 End stage renal disease: Secondary | ICD-10-CM | POA: Diagnosis not present

## 2023-05-18 DIAGNOSIS — D509 Iron deficiency anemia, unspecified: Secondary | ICD-10-CM | POA: Diagnosis not present

## 2023-05-18 DIAGNOSIS — D631 Anemia in chronic kidney disease: Secondary | ICD-10-CM | POA: Diagnosis not present

## 2023-05-18 DIAGNOSIS — N2581 Secondary hyperparathyroidism of renal origin: Secondary | ICD-10-CM | POA: Diagnosis not present

## 2023-05-19 ENCOUNTER — Other Ambulatory Visit: Payer: Self-pay | Admitting: "Endocrinology

## 2023-05-19 DIAGNOSIS — E1122 Type 2 diabetes mellitus with diabetic chronic kidney disease: Secondary | ICD-10-CM

## 2023-05-20 DIAGNOSIS — H25811 Combined forms of age-related cataract, right eye: Secondary | ICD-10-CM | POA: Diagnosis not present

## 2023-05-20 DIAGNOSIS — N186 End stage renal disease: Secondary | ICD-10-CM | POA: Diagnosis not present

## 2023-05-20 DIAGNOSIS — D631 Anemia in chronic kidney disease: Secondary | ICD-10-CM | POA: Diagnosis not present

## 2023-05-20 DIAGNOSIS — D509 Iron deficiency anemia, unspecified: Secondary | ICD-10-CM | POA: Diagnosis not present

## 2023-05-20 DIAGNOSIS — Z992 Dependence on renal dialysis: Secondary | ICD-10-CM | POA: Diagnosis not present

## 2023-05-20 DIAGNOSIS — N250 Renal osteodystrophy: Secondary | ICD-10-CM | POA: Diagnosis not present

## 2023-05-20 DIAGNOSIS — N2581 Secondary hyperparathyroidism of renal origin: Secondary | ICD-10-CM | POA: Diagnosis not present

## 2023-05-21 ENCOUNTER — Ambulatory Visit (HOSPITAL_COMMUNITY)
Admission: RE | Admit: 2023-05-21 | Discharge: 2023-05-21 | Disposition: A | Discharge status: Home / Self Care | Payer: Medicare Other | Source: Ambulatory Visit | Attending: "Endocrinology | Admitting: "Endocrinology

## 2023-05-21 DIAGNOSIS — E049 Nontoxic goiter, unspecified: Secondary | ICD-10-CM | POA: Diagnosis not present

## 2023-05-21 DIAGNOSIS — E041 Nontoxic single thyroid nodule: Secondary | ICD-10-CM | POA: Diagnosis not present

## 2023-05-22 DIAGNOSIS — N2581 Secondary hyperparathyroidism of renal origin: Secondary | ICD-10-CM | POA: Diagnosis not present

## 2023-05-22 DIAGNOSIS — Z992 Dependence on renal dialysis: Secondary | ICD-10-CM | POA: Diagnosis not present

## 2023-05-22 DIAGNOSIS — D509 Iron deficiency anemia, unspecified: Secondary | ICD-10-CM | POA: Diagnosis not present

## 2023-05-22 DIAGNOSIS — D631 Anemia in chronic kidney disease: Secondary | ICD-10-CM | POA: Diagnosis not present

## 2023-05-22 DIAGNOSIS — N250 Renal osteodystrophy: Secondary | ICD-10-CM | POA: Diagnosis not present

## 2023-05-22 DIAGNOSIS — N186 End stage renal disease: Secondary | ICD-10-CM | POA: Diagnosis not present

## 2023-05-24 ENCOUNTER — Ambulatory Visit (HOSPITAL_BASED_OUTPATIENT_CLINIC_OR_DEPARTMENT_OTHER): Payer: Medicare Other | Admitting: Certified Registered Nurse Anesthetist

## 2023-05-24 ENCOUNTER — Encounter (HOSPITAL_COMMUNITY)
Admission: RE | Disposition: A | Discharge status: Home / Self Care | Payer: Self-pay | Source: Home / Self Care | Attending: Ophthalmology

## 2023-05-24 ENCOUNTER — Ambulatory Visit (HOSPITAL_COMMUNITY): Payer: Medicare Other | Admitting: Certified Registered Nurse Anesthetist

## 2023-05-24 ENCOUNTER — Encounter (HOSPITAL_COMMUNITY): Payer: Self-pay | Admitting: Ophthalmology

## 2023-05-24 ENCOUNTER — Ambulatory Visit (HOSPITAL_COMMUNITY)
Admission: RE | Admit: 2023-05-24 | Discharge: 2023-05-24 | Disposition: A | Discharge status: Home / Self Care | Payer: Medicare Other | Source: Home / Self Care | Attending: Ophthalmology | Admitting: Ophthalmology

## 2023-05-24 DIAGNOSIS — G4730 Sleep apnea, unspecified: Secondary | ICD-10-CM | POA: Diagnosis not present

## 2023-05-24 DIAGNOSIS — I10 Essential (primary) hypertension: Secondary | ICD-10-CM | POA: Diagnosis not present

## 2023-05-24 DIAGNOSIS — Z6841 Body Mass Index (BMI) 40.0 and over, adult: Secondary | ICD-10-CM | POA: Diagnosis not present

## 2023-05-24 DIAGNOSIS — E1141 Type 2 diabetes mellitus with diabetic mononeuropathy: Secondary | ICD-10-CM | POA: Diagnosis present

## 2023-05-24 DIAGNOSIS — R2232 Localized swelling, mass and lump, left upper limb: Secondary | ICD-10-CM | POA: Diagnosis not present

## 2023-05-24 DIAGNOSIS — H25811 Combined forms of age-related cataract, right eye: Secondary | ICD-10-CM | POA: Insufficient documentation

## 2023-05-24 DIAGNOSIS — E1136 Type 2 diabetes mellitus with diabetic cataract: Secondary | ICD-10-CM | POA: Insufficient documentation

## 2023-05-24 DIAGNOSIS — Z9841 Cataract extraction status, right eye: Secondary | ICD-10-CM | POA: Diagnosis not present

## 2023-05-24 DIAGNOSIS — D631 Anemia in chronic kidney disease: Secondary | ICD-10-CM | POA: Diagnosis not present

## 2023-05-24 DIAGNOSIS — N186 End stage renal disease: Secondary | ICD-10-CM | POA: Diagnosis not present

## 2023-05-24 DIAGNOSIS — T82898A Other specified complication of vascular prosthetic devices, implants and grafts, initial encounter: Secondary | ICD-10-CM | POA: Diagnosis not present

## 2023-05-24 DIAGNOSIS — K219 Gastro-esophageal reflux disease without esophagitis: Secondary | ICD-10-CM | POA: Diagnosis not present

## 2023-05-24 DIAGNOSIS — I110 Hypertensive heart disease with heart failure: Secondary | ICD-10-CM

## 2023-05-24 DIAGNOSIS — E1122 Type 2 diabetes mellitus with diabetic chronic kidney disease: Secondary | ICD-10-CM | POA: Diagnosis not present

## 2023-05-24 DIAGNOSIS — Z8616 Personal history of COVID-19: Secondary | ICD-10-CM | POA: Insufficient documentation

## 2023-05-24 DIAGNOSIS — I509 Heart failure, unspecified: Secondary | ICD-10-CM | POA: Diagnosis not present

## 2023-05-24 DIAGNOSIS — M7989 Other specified soft tissue disorders: Secondary | ICD-10-CM | POA: Diagnosis not present

## 2023-05-24 DIAGNOSIS — H0100A Unspecified blepharitis right eye, upper and lower eyelids: Secondary | ICD-10-CM | POA: Insufficient documentation

## 2023-05-24 DIAGNOSIS — Z7984 Long term (current) use of oral hypoglycemic drugs: Secondary | ICD-10-CM | POA: Diagnosis not present

## 2023-05-24 DIAGNOSIS — T82868A Thrombosis of vascular prosthetic devices, implants and grafts, initial encounter: Secondary | ICD-10-CM | POA: Diagnosis not present

## 2023-05-24 DIAGNOSIS — D638 Anemia in other chronic diseases classified elsewhere: Secondary | ICD-10-CM | POA: Diagnosis not present

## 2023-05-24 DIAGNOSIS — Z8249 Family history of ischemic heart disease and other diseases of the circulatory system: Secondary | ICD-10-CM | POA: Diagnosis not present

## 2023-05-24 DIAGNOSIS — T82510A Breakdown (mechanical) of surgically created arteriovenous fistula, initial encounter: Secondary | ICD-10-CM | POA: Diagnosis not present

## 2023-05-24 DIAGNOSIS — N2581 Secondary hyperparathyroidism of renal origin: Secondary | ICD-10-CM | POA: Diagnosis not present

## 2023-05-24 DIAGNOSIS — Z9981 Dependence on supplemental oxygen: Secondary | ICD-10-CM | POA: Diagnosis not present

## 2023-05-24 DIAGNOSIS — I120 Hypertensive chronic kidney disease with stage 5 chronic kidney disease or end stage renal disease: Secondary | ICD-10-CM | POA: Diagnosis not present

## 2023-05-24 DIAGNOSIS — T829XXA Unspecified complication of cardiac and vascular prosthetic device, implant and graft, initial encounter: Secondary | ICD-10-CM | POA: Diagnosis not present

## 2023-05-24 DIAGNOSIS — H35052 Retinal neovascularization, unspecified, left eye: Secondary | ICD-10-CM | POA: Insufficient documentation

## 2023-05-24 DIAGNOSIS — Y832 Surgical operation with anastomosis, bypass or graft as the cause of abnormal reaction of the patient, or of later complication, without mention of misadventure at the time of the procedure: Secondary | ICD-10-CM | POA: Diagnosis present

## 2023-05-24 DIAGNOSIS — J9611 Chronic respiratory failure with hypoxia: Secondary | ICD-10-CM | POA: Diagnosis not present

## 2023-05-24 DIAGNOSIS — Z992 Dependence on renal dialysis: Secondary | ICD-10-CM | POA: Diagnosis not present

## 2023-05-24 DIAGNOSIS — D509 Iron deficiency anemia, unspecified: Secondary | ICD-10-CM | POA: Diagnosis not present

## 2023-05-24 DIAGNOSIS — H40053 Ocular hypertension, bilateral: Secondary | ICD-10-CM | POA: Insufficient documentation

## 2023-05-24 DIAGNOSIS — I132 Hypertensive heart and chronic kidney disease with heart failure and with stage 5 chronic kidney disease, or end stage renal disease: Secondary | ICD-10-CM | POA: Diagnosis not present

## 2023-05-24 DIAGNOSIS — Z794 Long term (current) use of insulin: Secondary | ICD-10-CM | POA: Diagnosis not present

## 2023-05-24 DIAGNOSIS — Y92009 Unspecified place in unspecified non-institutional (private) residence as the place of occurrence of the external cause: Secondary | ICD-10-CM | POA: Diagnosis not present

## 2023-05-24 DIAGNOSIS — I82C11 Acute embolism and thrombosis of right internal jugular vein: Secondary | ICD-10-CM | POA: Diagnosis present

## 2023-05-24 DIAGNOSIS — N250 Renal osteodystrophy: Secondary | ICD-10-CM | POA: Diagnosis not present

## 2023-05-24 DIAGNOSIS — T82838A Hemorrhage of vascular prosthetic devices, implants and grafts, initial encounter: Secondary | ICD-10-CM | POA: Diagnosis present

## 2023-05-24 DIAGNOSIS — H04123 Dry eye syndrome of bilateral lacrimal glands: Secondary | ICD-10-CM | POA: Insufficient documentation

## 2023-05-24 DIAGNOSIS — H0100B Unspecified blepharitis left eye, upper and lower eyelids: Secondary | ICD-10-CM | POA: Diagnosis present

## 2023-05-24 DIAGNOSIS — R918 Other nonspecific abnormal finding of lung field: Secondary | ICD-10-CM | POA: Diagnosis not present

## 2023-05-24 DIAGNOSIS — E785 Hyperlipidemia, unspecified: Secondary | ICD-10-CM | POA: Diagnosis present

## 2023-05-24 DIAGNOSIS — I959 Hypotension, unspecified: Secondary | ICD-10-CM | POA: Diagnosis present

## 2023-05-24 HISTORY — PX: CATARACT EXTRACTION W/PHACO: SHX586

## 2023-05-24 LAB — GLUCOSE, CAPILLARY: Glucose-Capillary: 143 mg/dL — ABNORMAL HIGH (ref 70–99)

## 2023-05-24 SURGERY — PHACOEMULSIFICATION, CATARACT, WITH IOL INSERTION
Anesthesia: Monitor Anesthesia Care | Site: Eye | Laterality: Right

## 2023-05-24 MED ORDER — LIDOCAINE HCL (PF) 1 % IJ SOLN
INTRAOCULAR | Status: DC | PRN
  Administered 2023-05-24: 1 mL via OPHTHALMIC

## 2023-05-24 MED ORDER — TROPICAMIDE 1 % OP SOLN
1.0000 [drp] | OPHTHALMIC | Status: AC | PRN
  Administered 2023-05-24 (×3): 1 [drp] via OPHTHALMIC

## 2023-05-24 MED ORDER — BSS IO SOLN
INTRAOCULAR | Status: DC | PRN
  Administered 2023-05-24: 15 mL via INTRAOCULAR

## 2023-05-24 MED ORDER — PROPOFOL 10 MG/ML IV BOLUS
INTRAVENOUS | Status: AC
  Filled 2023-05-24: qty 20

## 2023-05-24 MED ORDER — LACTATED RINGERS IV SOLN: INTRAVENOUS | Status: DC

## 2023-05-24 MED ORDER — SODIUM HYALURONATE 23MG/ML IO SOSY
PREFILLED_SYRINGE | INTRAOCULAR | Status: DC | PRN
  Administered 2023-05-24: .6 mL via INTRAOCULAR

## 2023-05-24 MED ORDER — MIDAZOLAM HCL 5 MG/5ML IJ SOLN
INTRAMUSCULAR | Status: DC | PRN
  Administered 2023-05-24: 1 mg via INTRAVENOUS

## 2023-05-24 MED ORDER — EPINEPHRINE PF 1 MG/ML IJ SOLN
INTRAMUSCULAR | Status: AC
  Filled 2023-05-24: qty 2

## 2023-05-24 MED ORDER — EPINEPHRINE PF 1 MG/ML IJ SOLN
INTRAOCULAR | Status: DC | PRN
  Administered 2023-05-24: 500 mL

## 2023-05-24 MED ORDER — PHENYLEPHRINE HCL 2.5 % OP SOLN
1.0000 [drp] | OPHTHALMIC | Status: AC | PRN
  Administered 2023-05-24 (×3): 1 [drp] via OPHTHALMIC

## 2023-05-24 MED ORDER — LIDOCAINE HCL 3.5 % OP GEL
1.0000 | Freq: Once | OPHTHALMIC | Status: AC
  Administered 2023-05-24: 1 via OPHTHALMIC

## 2023-05-24 MED ORDER — ONDANSETRON HCL 4 MG/2ML IJ SOLN
INTRAMUSCULAR | Status: DC | PRN
  Administered 2023-05-24: 4 mg via INTRAVENOUS

## 2023-05-24 MED ORDER — ONDANSETRON HCL 4 MG/2ML IJ SOLN
INTRAMUSCULAR | Status: AC
  Filled 2023-05-24: qty 10

## 2023-05-24 MED ORDER — SODIUM HYALURONATE 10 MG/ML IO SOLUTION
PREFILLED_SYRINGE | INTRAOCULAR | Status: DC | PRN
  Administered 2023-05-24: .85 mL via INTRAOCULAR

## 2023-05-24 MED ORDER — POVIDONE-IODINE 5 % OP SOLN
OPHTHALMIC | Status: DC | PRN
  Administered 2023-05-24: 1 via OPHTHALMIC

## 2023-05-24 MED ORDER — MIDAZOLAM HCL 2 MG/2ML IJ SOLN
INTRAMUSCULAR | Status: AC
  Filled 2023-05-24: qty 2

## 2023-05-24 MED ORDER — STERILE WATER FOR IRRIGATION IR SOLN
Status: DC | PRN
  Administered 2023-05-24: 250 mL

## 2023-05-24 MED ORDER — NEOMYCIN-POLYMYXIN-DEXAMETH 3.5-10000-0.1 OP SUSP
OPHTHALMIC | Status: DC | PRN
  Administered 2023-05-24: 2 [drp] via OPHTHALMIC

## 2023-05-24 MED ORDER — TETRACAINE HCL 0.5 % OP SOLN
1.0000 [drp] | OPHTHALMIC | Status: AC | PRN
  Administered 2023-05-24 (×3): 1 [drp] via OPHTHALMIC

## 2023-05-24 SURGICAL SUPPLY — 14 items
CATARACT SUITE SIGHTPATH (MISCELLANEOUS) ×1 IMPLANT
CLOTH BEACON ORANGE TIMEOUT ST (SAFETY) ×1 IMPLANT
EYE SHIELD UNIVERSAL CLEAR (GAUZE/BANDAGES/DRESSINGS) IMPLANT
FEE CATARACT SUITE SIGHTPATH (MISCELLANEOUS) ×1 IMPLANT
GLOVE BIOGEL PI IND STRL 7.0 (GLOVE) ×2 IMPLANT
LENS IOL RAYNER 20.5 (Intraocular Lens) ×1 IMPLANT
LENS IOL RAYONE EMV 20.5 (Intraocular Lens) IMPLANT
NDL HYPO 18GX1.5 BLUNT FILL (NEEDLE) ×1 IMPLANT
NEEDLE HYPO 18GX1.5 BLUNT FILL (NEEDLE) ×1 IMPLANT
PAD ARMBOARD 7.5X6 YLW CONV (MISCELLANEOUS) ×1 IMPLANT
RING MALYGIN 7.0 (MISCELLANEOUS) IMPLANT
SYR TB 1ML LL NO SAFETY (SYRINGE) ×1 IMPLANT
TAPE SURG TRANSPORE 1 IN (GAUZE/BANDAGES/DRESSINGS) IMPLANT
WATER STERILE IRR 250ML POUR (IV SOLUTION) ×1 IMPLANT

## 2023-05-24 NOTE — Discharge Instructions (Signed)
Please discharge patient when stable, will follow up today with Dr. Brisia Schuermann at the [facility] [place] office immediately following discharge.  Leave shield in place until visit.  All paperwork with discharge instructions will be given at the office.   [facility] Address:  [address]  [place], [place] [address]  

## 2023-05-24 NOTE — Interval H&P Note (Signed)
History and Physical Interval Note:  05/24/2023 7:47 AM  Patricia [name]  has presented today for surgery, with the diagnosis of combined forms age related cataract; right.  The various methods of treatment have been discussed with the patient and family. After consideration of risks, benefits and other options for treatment, the patient has consented to  Procedure(s): CATARACT EXTRACTION PHACO AND INTRAOCULAR LENS PLACEMENT (IOC) (Right) as a surgical intervention.  The patient's history has been reviewed, patient examined, no change in status, stable for surgery.  I have reviewed the patient's chart and labs.  Questions were answered to the patient's satisfaction.     [name]

## 2023-05-24 NOTE — Transfer of Care (Signed)
Immediate Anesthesia Transfer of Care Note  Patient: Patricia [name]  Procedure(s) Performed: CATARACT EXTRACTION PHACO AND INTRAOCULAR LENS PLACEMENT (IOC) (Right: Eye)  Patient Location: PACU and Short Stay  Anesthesia Type:MAC  Level of Consciousness: awake, alert , and oriented  Airway & Oxygen Therapy: Patient Spontanous Breathing and Patient connected to nasal cannula oxygen  Post-op Assessment: Report given to RN  Post vital signs: Reviewed and stable  Last Vitals:  Vitals Value Taken Time  BP    Temp    Pulse    Resp    SpO2      Last Pain:  Vitals:   05/24/23 0706  TempSrc: Oral  PainSc: 6       Patients Stated Pain Goal: 6 (05/24/23 0706)  Complications: No notable events documented.

## 2023-05-24 NOTE — Anesthesia Postprocedure Evaluation (Signed)
Anesthesia Post Note  Patient: Patricia [name]  Procedure(s) Performed: CATARACT EXTRACTION PHACO AND INTRAOCULAR LENS PLACEMENT (IOC) (Right: Eye)  Patient location during evaluation: Phase II Anesthesia Type: MAC Level of consciousness: awake Pain management: pain level controlled Vital Signs Assessment: post-procedure vital signs reviewed and stable Respiratory status: spontaneous breathing and respiratory function stable Cardiovascular status: blood pressure returned to baseline and stable Postop Assessment: no headache and no apparent nausea or vomiting Anesthetic complications: no Comments: Late entry   No notable events documented.   Last Vitals:  Vitals:   05/24/23 0706 05/24/23 0816  BP: (!) 190/91 (!) 156/79  Pulse: 79 76  Resp: 19 19  Temp: 36.6 C   SpO2: 96% 99%    Last Pain:  Vitals:   05/24/23 0816  TempSrc:   PainSc: 0-No pain                 Windell Norfolk

## 2023-05-24 NOTE — Op Note (Signed)
Date of procedure: 05/24/23  Pre-operative diagnosis:  Visually significant combined form age-related cataract, Right Eye (H25.811)  Post-operative diagnosis:  Visually significant combined form age-related cataract, Right Eye (H25.811)  Procedure: Removal of cataract via phacoemulsification and insertion of intra-ocular lens Rayner [id number] +20.5D into the capsular bag of the Right Eye  Attending surgeon: Rudy Jew. Cailin Gebel, MD, MA  Anesthesia: MAC, Topical Akten  Complications: None  Estimated Blood Loss: <1mL (minimal)  Specimens: None  Implants: As above  Indications:  Visually significant age-related cataract, Right Eye  Procedure:  The patient was seen and identified in the pre-operative area. The operative eye was identified and dilated.  The operative eye was marked.  Topical anesthesia was administered to the operative eye.     The patient was then to the operative suite and placed in the supine position.  A timeout was performed confirming the patient, procedure to be performed, and all other relevant information.   The patient's face was prepped and draped in the usual fashion for intra-ocular surgery.  A lid speculum was placed into the operative eye and the surgical microscope moved into place and focused.  A superotemporal paracentesis was created using a 20 gauge paracentesis blade.  Shugarcaine was injected into the anterior chamber.  Viscoelastic was injected into the anterior chamber.  A temporal clear-corneal main wound incision was created using a 2.61mm microkeratome.  A continuous curvilinear capsulorrhexis was initiated using an irrigating cystitome and completed using capsulorrhexis forceps.  Hydrodissection and hydrodeliniation were performed.  Viscoelastic was injected into the anterior chamber.  A phacoemulsification handpiece and a chopper as a second instrument were used to remove the nucleus and epinucleus. The irrigation/aspiration handpiece was used to remove any  remaining cortical material.   The capsular bag was reinflated with viscoelastic, checked, and found to be intact.  The intraocular lens was inserted into the capsular bag.  The irrigation/aspiration handpiece was used to remove any remaining viscoelastic.  The clear corneal wound and paracentesis wounds were then hydrated and checked with Weck-Cels to be watertight. Maxitrol drops were instilled into the operative eye.  The lid-speculum was removed.  The drape was removed.  The patient's face was cleaned with a wet and dry 4x4. A clear shield was taped over the eye. The patient was taken to the post-operative care unit in good condition, having tolerated the procedure well.  Post-Op Instructions: The patient will follow up at [facility] for a same day post-operative evaluation and will receive all other orders and instructions.

## 2023-05-24 NOTE — Anesthesia Preprocedure Evaluation (Signed)
Anesthesia Evaluation  Patient identified by MRN, date of birth, ID band Patient awake    Reviewed: Allergy & Precautions, H&P , NPO status , Patient's Chart, lab work & pertinent test results, reviewed documented beta blocker date and time   History of Anesthesia Complications (+) PONV and history of anesthetic complications  Airway Mallampati: II  TM Distance: >3 FB Neck ROM: full    Dental no notable dental hx.    Pulmonary neg pulmonary ROS, shortness of breath, asthma , sleep apnea , pneumonia   Pulmonary exam normal breath sounds clear to auscultation       Cardiovascular Exercise Tolerance: Good hypertension, +CHF  negative cardio ROS  Rhythm:regular Rate:Normal     Neuro/Psych negative neurological ROS  negative psych ROS   GI/Hepatic negative GI ROS, Neg liver ROS,GERD  ,,  Endo/Other  negative endocrine ROSdiabetes    Renal/GU Renal diseasenegative Renal ROS  negative genitourinary   Musculoskeletal   Abdominal   Peds  Hematology negative hematology ROS (+) Blood dyscrasia, anemia   Anesthesia Other Findings   Reproductive/Obstetrics negative OB ROS                             Anesthesia Physical Anesthesia Plan  ASA: 3  Anesthesia Plan: MAC   Post-op Pain Management:    Induction:   PONV Risk Score and Plan:   Airway Management Planned:   Additional Equipment:   Intra-op Plan:   Post-operative Plan:   Informed Consent: I have reviewed the patients History and Physical, chart, labs and discussed the procedure including the risks, benefits and alternatives for the proposed anesthesia with the patient or authorized representative who has indicated his/her understanding and acceptance.     Dental Advisory Given  Plan Discussed with: CRNA  Anesthesia Plan Comments:        Anesthesia Quick Evaluation

## 2023-05-25 DIAGNOSIS — N186 End stage renal disease: Secondary | ICD-10-CM | POA: Diagnosis not present

## 2023-05-25 DIAGNOSIS — D631 Anemia in chronic kidney disease: Secondary | ICD-10-CM | POA: Diagnosis not present

## 2023-05-25 DIAGNOSIS — N250 Renal osteodystrophy: Secondary | ICD-10-CM | POA: Diagnosis not present

## 2023-05-25 DIAGNOSIS — Z992 Dependence on renal dialysis: Secondary | ICD-10-CM | POA: Diagnosis not present

## 2023-05-25 DIAGNOSIS — D509 Iron deficiency anemia, unspecified: Secondary | ICD-10-CM | POA: Diagnosis not present

## 2023-05-25 DIAGNOSIS — N2581 Secondary hyperparathyroidism of renal origin: Secondary | ICD-10-CM | POA: Diagnosis not present

## 2023-05-26 ENCOUNTER — Observation Stay (HOSPITAL_COMMUNITY): Payer: Medicare Other

## 2023-05-26 ENCOUNTER — Encounter (HOSPITAL_COMMUNITY): Payer: Medicare Other

## 2023-05-26 ENCOUNTER — Encounter (HOSPITAL_COMMUNITY)
Admission: EM | Disposition: A | Discharge status: Home / Self Care | Payer: Self-pay | Source: Home / Self Care | Attending: Family Medicine

## 2023-05-26 ENCOUNTER — Encounter (HOSPITAL_COMMUNITY): Payer: Self-pay

## 2023-05-26 ENCOUNTER — Observation Stay (HOSPITAL_BASED_OUTPATIENT_CLINIC_OR_DEPARTMENT_OTHER): Payer: Medicare Other | Admitting: Anesthesiology

## 2023-05-26 ENCOUNTER — Other Ambulatory Visit: Payer: Self-pay

## 2023-05-26 ENCOUNTER — Inpatient Hospital Stay (HOSPITAL_COMMUNITY)
Admission: EM | Admit: 2023-05-26 | Discharge: 2023-05-28 | DRG: 252 | Disposition: A | Discharge status: Home / Self Care | Payer: Medicare Other | Attending: Family Medicine | Admitting: Family Medicine

## 2023-05-26 ENCOUNTER — Observation Stay (HOSPITAL_COMMUNITY): Payer: Medicare Other | Admitting: Anesthesiology

## 2023-05-26 DIAGNOSIS — Z794 Long term (current) use of insulin: Secondary | ICD-10-CM

## 2023-05-26 DIAGNOSIS — T82510A Breakdown (mechanical) of surgically created arteriovenous fistula, initial encounter: Principal | ICD-10-CM | POA: Diagnosis present

## 2023-05-26 DIAGNOSIS — Y832 Surgical operation with anastomosis, bypass or graft as the cause of abnormal reaction of the patient, or of later complication, without mention of misadventure at the time of the procedure: Secondary | ICD-10-CM | POA: Diagnosis present

## 2023-05-26 DIAGNOSIS — D631 Anemia in chronic kidney disease: Secondary | ICD-10-CM | POA: Diagnosis present

## 2023-05-26 DIAGNOSIS — Z9841 Cataract extraction status, right eye: Secondary | ICD-10-CM | POA: Diagnosis not present

## 2023-05-26 DIAGNOSIS — N186 End stage renal disease: Secondary | ICD-10-CM

## 2023-05-26 DIAGNOSIS — T82898A Other specified complication of vascular prosthetic devices, implants and grafts, initial encounter: Secondary | ICD-10-CM

## 2023-05-26 DIAGNOSIS — Z7984 Long term (current) use of oral hypoglycemic drugs: Secondary | ICD-10-CM

## 2023-05-26 DIAGNOSIS — H0100A Unspecified blepharitis right eye, upper and lower eyelids: Secondary | ICD-10-CM | POA: Diagnosis present

## 2023-05-26 DIAGNOSIS — T82868A Thrombosis of vascular prosthetic devices, implants and grafts, initial encounter: Secondary | ICD-10-CM | POA: Diagnosis not present

## 2023-05-26 DIAGNOSIS — I509 Heart failure, unspecified: Secondary | ICD-10-CM | POA: Diagnosis not present

## 2023-05-26 DIAGNOSIS — I959 Hypotension, unspecified: Secondary | ICD-10-CM | POA: Diagnosis present

## 2023-05-26 DIAGNOSIS — E785 Hyperlipidemia, unspecified: Secondary | ICD-10-CM | POA: Diagnosis present

## 2023-05-26 DIAGNOSIS — Z888 Allergy status to other drugs, medicaments and biological substances status: Secondary | ICD-10-CM

## 2023-05-26 DIAGNOSIS — E1136 Type 2 diabetes mellitus with diabetic cataract: Secondary | ICD-10-CM | POA: Diagnosis present

## 2023-05-26 DIAGNOSIS — E1122 Type 2 diabetes mellitus with diabetic chronic kidney disease: Secondary | ICD-10-CM | POA: Diagnosis present

## 2023-05-26 DIAGNOSIS — I132 Hypertensive heart and chronic kidney disease with heart failure and with stage 5 chronic kidney disease, or end stage renal disease: Secondary | ICD-10-CM | POA: Diagnosis not present

## 2023-05-26 DIAGNOSIS — Z7982 Long term (current) use of aspirin: Secondary | ICD-10-CM

## 2023-05-26 DIAGNOSIS — I120 Hypertensive chronic kidney disease with stage 5 chronic kidney disease or end stage renal disease: Secondary | ICD-10-CM | POA: Diagnosis present

## 2023-05-26 DIAGNOSIS — J9611 Chronic respiratory failure with hypoxia: Secondary | ICD-10-CM | POA: Diagnosis present

## 2023-05-26 DIAGNOSIS — Z6841 Body Mass Index (BMI) 40.0 and over, adult: Secondary | ICD-10-CM

## 2023-05-26 DIAGNOSIS — K219 Gastro-esophageal reflux disease without esophagitis: Secondary | ICD-10-CM | POA: Diagnosis present

## 2023-05-26 DIAGNOSIS — Z9012 Acquired absence of left breast and nipple: Secondary | ICD-10-CM

## 2023-05-26 DIAGNOSIS — H40053 Ocular hypertension, bilateral: Secondary | ICD-10-CM | POA: Diagnosis present

## 2023-05-26 DIAGNOSIS — G4733 Obstructive sleep apnea (adult) (pediatric): Secondary | ICD-10-CM | POA: Diagnosis present

## 2023-05-26 DIAGNOSIS — R918 Other nonspecific abnormal finding of lung field: Secondary | ICD-10-CM | POA: Diagnosis not present

## 2023-05-26 DIAGNOSIS — Z9981 Dependence on supplemental oxygen: Secondary | ICD-10-CM | POA: Diagnosis not present

## 2023-05-26 DIAGNOSIS — I10 Essential (primary) hypertension: Secondary | ICD-10-CM | POA: Diagnosis not present

## 2023-05-26 DIAGNOSIS — Z992 Dependence on renal dialysis: Secondary | ICD-10-CM

## 2023-05-26 DIAGNOSIS — R112 Nausea with vomiting, unspecified: Secondary | ICD-10-CM | POA: Diagnosis not present

## 2023-05-26 DIAGNOSIS — Z8616 Personal history of COVID-19: Secondary | ICD-10-CM

## 2023-05-26 DIAGNOSIS — T829XXA Unspecified complication of cardiac and vascular prosthetic device, implant and graft, initial encounter: Secondary | ICD-10-CM | POA: Diagnosis not present

## 2023-05-26 DIAGNOSIS — Z7951 Long term (current) use of inhaled steroids: Secondary | ICD-10-CM

## 2023-05-26 DIAGNOSIS — H0100B Unspecified blepharitis left eye, upper and lower eyelids: Secondary | ICD-10-CM | POA: Diagnosis present

## 2023-05-26 DIAGNOSIS — R2232 Localized swelling, mass and lump, left upper limb: Secondary | ICD-10-CM | POA: Diagnosis not present

## 2023-05-26 DIAGNOSIS — Z90710 Acquired absence of both cervix and uterus: Secondary | ICD-10-CM

## 2023-05-26 DIAGNOSIS — I82C11 Acute embolism and thrombosis of right internal jugular vein: Secondary | ICD-10-CM | POA: Diagnosis present

## 2023-05-26 DIAGNOSIS — E1141 Type 2 diabetes mellitus with diabetic mononeuropathy: Secondary | ICD-10-CM | POA: Diagnosis present

## 2023-05-26 DIAGNOSIS — T40605A Adverse effect of unspecified narcotics, initial encounter: Secondary | ICD-10-CM | POA: Diagnosis not present

## 2023-05-26 DIAGNOSIS — H25811 Combined forms of age-related cataract, right eye: Secondary | ICD-10-CM | POA: Diagnosis present

## 2023-05-26 DIAGNOSIS — N250 Renal osteodystrophy: Secondary | ICD-10-CM | POA: Diagnosis not present

## 2023-05-26 DIAGNOSIS — Z9049 Acquired absence of other specified parts of digestive tract: Secondary | ICD-10-CM

## 2023-05-26 DIAGNOSIS — H35052 Retinal neovascularization, unspecified, left eye: Secondary | ICD-10-CM | POA: Diagnosis present

## 2023-05-26 DIAGNOSIS — M7989 Other specified soft tissue disorders: Principal | ICD-10-CM

## 2023-05-26 DIAGNOSIS — D638 Anemia in other chronic diseases classified elsewhere: Secondary | ICD-10-CM | POA: Diagnosis not present

## 2023-05-26 DIAGNOSIS — Y92009 Unspecified place in unspecified non-institutional (private) residence as the place of occurrence of the external cause: Secondary | ICD-10-CM

## 2023-05-26 DIAGNOSIS — H04123 Dry eye syndrome of bilateral lacrimal glands: Secondary | ICD-10-CM | POA: Diagnosis present

## 2023-05-26 DIAGNOSIS — Z79899 Other long term (current) drug therapy: Secondary | ICD-10-CM

## 2023-05-26 DIAGNOSIS — Z833 Family history of diabetes mellitus: Secondary | ICD-10-CM

## 2023-05-26 DIAGNOSIS — T82838A Hemorrhage of vascular prosthetic devices, implants and grafts, initial encounter: Secondary | ICD-10-CM | POA: Diagnosis present

## 2023-05-26 DIAGNOSIS — Z8249 Family history of ischemic heart disease and other diseases of the circulatory system: Secondary | ICD-10-CM | POA: Diagnosis not present

## 2023-05-26 DIAGNOSIS — H409 Unspecified glaucoma: Secondary | ICD-10-CM | POA: Diagnosis present

## 2023-05-26 DIAGNOSIS — Z853 Personal history of malignant neoplasm of breast: Secondary | ICD-10-CM

## 2023-05-26 HISTORY — PX: INSERTION OF DIALYSIS CATHETER: SHX1324

## 2023-05-26 HISTORY — PX: REVISON OF ARTERIOVENOUS FISTULA: SHX6074

## 2023-05-26 HISTORY — PX: APPLICATION OF WOUND VAC: SHX5189

## 2023-05-26 LAB — BASIC METABOLIC PANEL
Anion gap: 14 (ref 5–15)
BUN: 47 mg/dL — ABNORMAL HIGH (ref 6–20)
CO2: 24 mmol/L (ref 22–32)
Calcium: 10.2 mg/dL (ref 8.9–10.3)
Chloride: 100 mmol/L (ref 98–111)
Creatinine, Ser: 9.4 mg/dL — ABNORMAL HIGH (ref 0.44–1.00)
GFR, Estimated: 5 mL/min — ABNORMAL LOW (ref >60)
Glucose, Bld: 193 mg/dL — ABNORMAL HIGH (ref 70–99)
Potassium: 4.3 mmol/L (ref 3.5–5.1)
Sodium: 138 mmol/L (ref 135–145)

## 2023-05-26 LAB — CBC
HCT: 31.1 % — ABNORMAL LOW (ref 36.0–46.0)
Hemoglobin: 9.6 g/dL — ABNORMAL LOW (ref 12.0–15.0)
MCH: 28 pg (ref 26.0–34.0)
MCHC: 30.9 g/dL (ref 30.0–36.0)
MCV: 90.7 fL (ref 80.0–100.0)
Platelets: 222 10*3/uL (ref 150–400)
RBC: 3.43 MIL/uL — ABNORMAL LOW (ref 3.87–5.11)
RDW: 13.7 % (ref 11.5–15.5)
WBC: 12.4 10*3/uL — ABNORMAL HIGH (ref 4.0–10.5)
nRBC: 0 % (ref 0.0–0.2)

## 2023-05-26 LAB — GLUCOSE, CAPILLARY
Glucose-Capillary: 126 mg/dL — ABNORMAL HIGH (ref 70–99)
Glucose-Capillary: 219 mg/dL — ABNORMAL HIGH (ref 70–99)

## 2023-05-26 LAB — MRSA NEXT GEN BY PCR, NASAL: MRSA by PCR Next Gen: NOT DETECTED

## 2023-05-26 SURGERY — REVISON OF ARTERIOVENOUS FISTULA
Anesthesia: General | Site: Neck | Laterality: Left

## 2023-05-26 MED ORDER — ACETAMINOPHEN 10 MG/ML IV SOLN: 1000.0000 mg | Freq: Once | INTRAVENOUS | Status: DC | PRN

## 2023-05-26 MED ORDER — HEPARIN SODIUM (PORCINE) 1000 UNIT/ML IJ SOLN
INTRAMUSCULAR | Status: DC | PRN
  Administered 2023-05-26: 4000 [IU] via INTRAVENOUS

## 2023-05-26 MED ORDER — ALBUMIN HUMAN 5 % IV SOLN: INTRAVENOUS | Status: DC | PRN

## 2023-05-26 MED ORDER — OXYCODONE HCL 5 MG PO TABS
5.0000 mg | ORAL_TABLET | ORAL | Status: DC | PRN
  Administered 2023-05-26 – 2023-05-27 (×4): 5 mg via ORAL
  Filled 2023-05-26 (×6): qty 1

## 2023-05-26 MED ORDER — 0.9 % SODIUM CHLORIDE (POUR BTL) OPTIME
TOPICAL | Status: DC | PRN
  Administered 2023-05-26: 1000 mL

## 2023-05-26 MED ORDER — CHLORHEXIDINE GLUCONATE CLOTH 2 % EX PADS: 6.0000 | MEDICATED_PAD | Freq: Every day | CUTANEOUS | Status: DC

## 2023-05-26 MED ORDER — LIDOCAINE HCL (PF) 1 % IJ SOLN
INTRAMUSCULAR | Status: AC
  Filled 2023-05-26: qty 30

## 2023-05-26 MED ORDER — ALBUTEROL SULFATE (2.5 MG/3ML) 0.083% IN NEBU: 2.5000 mg | INHALATION_SOLUTION | RESPIRATORY_TRACT | Status: DC | PRN

## 2023-05-26 MED ORDER — CHLORHEXIDINE GLUCONATE 0.12 % MT SOLN: 15.0000 mL | Freq: Once | OROMUCOSAL | Status: AC

## 2023-05-26 MED ORDER — ROCURONIUM BROMIDE 10 MG/ML (PF) SYRINGE
PREFILLED_SYRINGE | INTRAVENOUS | Status: AC
  Filled 2023-05-26: qty 10

## 2023-05-26 MED ORDER — ACETAMINOPHEN 650 MG RE SUPP: 650.0000 mg | Freq: Four times a day (QID) | RECTAL | Status: DC | PRN

## 2023-05-26 MED ORDER — NIFEDIPINE ER OSMOTIC RELEASE 30 MG PO TB24
30.0000 mg | ORAL_TABLET | Freq: Every day | ORAL | Status: DC
  Administered 2023-05-26 – 2023-05-27 (×2): 30 mg via ORAL
  Filled 2023-05-26 (×3): qty 1

## 2023-05-26 MED ORDER — ACETAMINOPHEN 325 MG PO TABS
650.0000 mg | ORAL_TABLET | Freq: Four times a day (QID) | ORAL | Status: DC | PRN
  Administered 2023-05-26 – 2023-05-28 (×4): 650 mg via ORAL
  Filled 2023-05-26 (×4): qty 2

## 2023-05-26 MED ORDER — HEMOSTATIC AGENTS (NO CHARGE) OPTIME
TOPICAL | Status: DC | PRN
  Administered 2023-05-26: 2 via TOPICAL

## 2023-05-26 MED ORDER — ONDANSETRON HCL 4 MG PO TABS: 4.0000 mg | ORAL_TABLET | Freq: Four times a day (QID) | ORAL | Status: DC | PRN

## 2023-05-26 MED ORDER — INSULIN ASPART 100 UNIT/ML IJ SOLN
0.0000 [IU] | Freq: Three times a day (TID) | INTRAMUSCULAR | Status: DC
  Administered 2023-05-27 (×2): 1 [IU] via SUBCUTANEOUS
  Administered 2023-05-28: 3 [IU] via SUBCUTANEOUS

## 2023-05-26 MED ORDER — HEPARIN 6000 UNIT IRRIGATION SOLUTION
Status: AC
  Filled 2023-05-26: qty 500

## 2023-05-26 MED ORDER — ROPINIROLE HCL 1 MG PO TABS
1.0000 mg | ORAL_TABLET | Freq: Two times a day (BID) | ORAL | Status: DC
  Administered 2023-05-26 – 2023-05-28 (×4): 1 mg via ORAL
  Filled 2023-05-26 (×4): qty 1

## 2023-05-26 MED ORDER — HEPARIN SODIUM (PORCINE) 5000 UNIT/ML IJ SOLN
5000.0000 [IU] | Freq: Three times a day (TID) | INTRAMUSCULAR | Status: DC
  Administered 2023-05-26 – 2023-05-28 (×5): 5000 [IU] via SUBCUTANEOUS
  Filled 2023-05-26 (×3): qty 1

## 2023-05-26 MED ORDER — FENTANYL CITRATE (PF) 100 MCG/2ML IJ SOLN
25.0000 ug | INTRAMUSCULAR | Status: DC | PRN
  Administered 2023-05-26 (×2): 25 ug via INTRAVENOUS

## 2023-05-26 MED ORDER — LIDOCAINE 2% (20 MG/ML) 5 ML SYRINGE
INTRAMUSCULAR | Status: DC | PRN
  Administered 2023-05-26: 60 mg via INTRAVENOUS

## 2023-05-26 MED ORDER — INSULIN ASPART 100 UNIT/ML IJ SOLN
0.0000 [IU] | INTRAMUSCULAR | Status: DC | PRN
  Administered 2023-05-26: 2 [IU] via SUBCUTANEOUS

## 2023-05-26 MED ORDER — AMISULPRIDE (ANTIEMETIC) 5 MG/2ML IV SOLN
INTRAVENOUS | Status: AC
  Filled 2023-05-26: qty 4

## 2023-05-26 MED ORDER — SUCCINYLCHOLINE CHLORIDE 200 MG/10ML IV SOSY
PREFILLED_SYRINGE | INTRAVENOUS | Status: AC
  Filled 2023-05-26: qty 10

## 2023-05-26 MED ORDER — AMISULPRIDE (ANTIEMETIC) 5 MG/2ML IV SOLN
10.0000 mg | Freq: Once | INTRAVENOUS | Status: AC
  Administered 2023-05-26: 10 mg via INTRAVENOUS

## 2023-05-26 MED ORDER — ONDANSETRON HCL 4 MG/2ML IJ SOLN
INTRAMUSCULAR | Status: AC
  Filled 2023-05-26: qty 2

## 2023-05-26 MED ORDER — CEFAZOLIN SODIUM 1 G IJ SOLR
INTRAMUSCULAR | Status: AC
  Filled 2023-05-26: qty 20

## 2023-05-26 MED ORDER — HEPARIN SODIUM (PORCINE) 1000 UNIT/ML IJ SOLN
INTRAMUSCULAR | Status: DC | PRN
  Administered 2023-05-26: 3.8 [IU] via INTRAVENOUS

## 2023-05-26 MED ORDER — ONDANSETRON HCL 4 MG/2ML IJ SOLN
4.0000 mg | Freq: Four times a day (QID) | INTRAMUSCULAR | Status: DC | PRN
  Administered 2023-05-27 (×2): 4 mg via INTRAVENOUS
  Filled 2023-05-26 (×3): qty 2

## 2023-05-26 MED ORDER — HEPARIN SODIUM (PORCINE) 1000 UNIT/ML IJ SOLN
INTRAMUSCULAR | Status: AC
  Filled 2023-05-26: qty 10

## 2023-05-26 MED ORDER — LIDOCAINE 2% (20 MG/ML) 5 ML SYRINGE
INTRAMUSCULAR | Status: AC
  Filled 2023-05-26: qty 5

## 2023-05-26 MED ORDER — OXYCODONE-ACETAMINOPHEN 5-325 MG PO TABS
1.0000 | ORAL_TABLET | Freq: Once | ORAL | Status: AC
  Administered 2023-05-26: 1 via ORAL
  Filled 2023-05-26: qty 1

## 2023-05-26 MED ORDER — DEXAMETHASONE SODIUM PHOSPHATE 10 MG/ML IJ SOLN
INTRAMUSCULAR | Status: DC | PRN
  Administered 2023-05-26: 5 mg via INTRAVENOUS

## 2023-05-26 MED ORDER — HEPARIN 6000 UNIT IRRIGATION SOLUTION
Status: DC | PRN
  Administered 2023-05-26: 1

## 2023-05-26 MED ORDER — CHLORHEXIDINE GLUCONATE 0.12 % MT SOLN
OROMUCOSAL | Status: AC
  Administered 2023-05-26: 15 mL via OROMUCOSAL
  Filled 2023-05-26: qty 15

## 2023-05-26 MED ORDER — LACTATED RINGERS IV SOLN: INTRAVENOUS | Status: DC

## 2023-05-26 MED ORDER — FENTANYL CITRATE (PF) 250 MCG/5ML IJ SOLN
INTRAMUSCULAR | Status: AC
  Filled 2023-05-26: qty 5

## 2023-05-26 MED ORDER — ROSUVASTATIN CALCIUM 20 MG PO TABS
20.0000 mg | ORAL_TABLET | Freq: Every day | ORAL | Status: DC
  Administered 2023-05-27 – 2023-05-28 (×2): 20 mg via ORAL
  Filled 2023-05-26 (×2): qty 1

## 2023-05-26 MED ORDER — PROPOFOL 10 MG/ML IV BOLUS
INTRAVENOUS | Status: DC | PRN
  Administered 2023-05-26: 20 mg via INTRAVENOUS
  Administered 2023-05-26: 150 mg via INTRAVENOUS

## 2023-05-26 MED ORDER — DEXMEDETOMIDINE HCL IN NACL 200 MCG/50ML IV SOLN
INTRAVENOUS | Status: DC | PRN
  Administered 2023-05-26: 20 ug via INTRAVENOUS

## 2023-05-26 MED ORDER — MIDAZOLAM HCL 2 MG/2ML IJ SOLN
INTRAMUSCULAR | Status: AC
  Filled 2023-05-26: qty 2

## 2023-05-26 MED ORDER — PROPOFOL 10 MG/ML IV BOLUS
INTRAVENOUS | Status: AC
  Filled 2023-05-26: qty 20

## 2023-05-26 MED ORDER — SURGIFLO WITH THROMBIN (HEMOSTATIC MATRIX KIT) OPTIME
TOPICAL | Status: DC | PRN
  Administered 2023-05-26: 1 via TOPICAL

## 2023-05-26 MED ORDER — SUGAMMADEX SODIUM 200 MG/2ML IV SOLN
INTRAVENOUS | Status: DC | PRN
  Administered 2023-05-26: 200 mg via INTRAVENOUS

## 2023-05-26 MED ORDER — FENTANYL CITRATE (PF) 250 MCG/5ML IJ SOLN
INTRAMUSCULAR | Status: DC | PRN
  Administered 2023-05-26: 100 ug via INTRAVENOUS
  Administered 2023-05-26: 50 ug via INTRAVENOUS

## 2023-05-26 MED ORDER — LABETALOL HCL 200 MG PO TABS
100.0000 mg | ORAL_TABLET | Freq: Two times a day (BID) | ORAL | Status: DC
  Administered 2023-05-26 – 2023-05-28 (×3): 100 mg via ORAL
  Filled 2023-05-26 (×4): qty 1

## 2023-05-26 MED ORDER — ROCURONIUM BROMIDE 10 MG/ML (PF) SYRINGE
PREFILLED_SYRINGE | INTRAVENOUS | Status: DC | PRN
  Administered 2023-05-26: 50 mg via INTRAVENOUS
  Administered 2023-05-26: 20 mg via INTRAVENOUS

## 2023-05-26 MED ORDER — SODIUM CHLORIDE 0.9 % IV SOLN: INTRAVENOUS | Status: DC | PRN

## 2023-05-26 MED ORDER — SUCCINYLCHOLINE CHLORIDE 200 MG/10ML IV SOSY
PREFILLED_SYRINGE | INTRAVENOUS | Status: DC | PRN
  Administered 2023-05-26: 140 mg via INTRAVENOUS

## 2023-05-26 MED ORDER — PHENYLEPHRINE 80 MCG/ML (10ML) SYRINGE FOR IV PUSH (FOR BLOOD PRESSURE SUPPORT)
PREFILLED_SYRINGE | INTRAVENOUS | Status: AC
  Filled 2023-05-26: qty 10

## 2023-05-26 MED ORDER — FENTANYL CITRATE (PF) 100 MCG/2ML IJ SOLN
INTRAMUSCULAR | Status: AC
  Filled 2023-05-26: qty 2

## 2023-05-26 MED ORDER — DEXAMETHASONE SODIUM PHOSPHATE 10 MG/ML IJ SOLN
INTRAMUSCULAR | Status: AC
  Filled 2023-05-26: qty 1

## 2023-05-26 MED ORDER — ORAL CARE MOUTH RINSE: 15.0000 mL | OROMUCOSAL | Status: DC | PRN

## 2023-05-26 MED ORDER — CHLORHEXIDINE GLUCONATE 0.12 % MT SOLN
OROMUCOSAL | Status: AC
  Filled 2023-05-26: qty 15

## 2023-05-26 MED ORDER — SODIUM CHLORIDE 0.9 % IV SOLN
20.0000 ug | INTRAVENOUS | Status: AC
  Administered 2023-05-26: 20 ug via INTRAVENOUS
  Filled 2023-05-26: qty 5

## 2023-05-26 MED ORDER — CEFAZOLIN SODIUM-DEXTROSE 2-3 GM-%(50ML) IV SOLR
INTRAVENOUS | Status: DC | PRN
  Administered 2023-05-26: 2 g via INTRAVENOUS

## 2023-05-26 MED ORDER — INSULIN ASPART 100 UNIT/ML IJ SOLN
0.0000 [IU] | Freq: Every day | INTRAMUSCULAR | Status: DC
  Administered 2023-05-26: 2 [IU] via SUBCUTANEOUS

## 2023-05-26 MED ORDER — IRBESARTAN 75 MG PO TABS
75.0000 mg | ORAL_TABLET | Freq: Every day | ORAL | Status: DC
  Administered 2023-05-27 – 2023-05-28 (×2): 75 mg via ORAL
  Filled 2023-05-26 (×2): qty 1

## 2023-05-26 MED ORDER — HYDROMORPHONE HCL 1 MG/ML IJ SOLN
0.5000 mg | INTRAMUSCULAR | Status: DC | PRN
  Administered 2023-05-26 – 2023-05-27 (×2): 0.5 mg via INTRAVENOUS
  Filled 2023-05-26 (×3): qty 1

## 2023-05-26 MED ORDER — PHENYLEPHRINE 80 MCG/ML (10ML) SYRINGE FOR IV PUSH (FOR BLOOD PRESSURE SUPPORT)
PREFILLED_SYRINGE | INTRAVENOUS | Status: DC | PRN
  Administered 2023-05-26: 120 ug via INTRAVENOUS

## 2023-05-26 MED ORDER — ONDANSETRON HCL 4 MG/2ML IJ SOLN
INTRAMUSCULAR | Status: DC | PRN
  Administered 2023-05-26: 4 mg via INTRAVENOUS

## 2023-05-26 MED ORDER — ORAL CARE MOUTH RINSE: 15.0000 mL | Freq: Once | OROMUCOSAL | Status: AC

## 2023-05-26 SURGICAL SUPPLY — 62 items
ARMBAND PINK RESTRICT EXTREMIT (MISCELLANEOUS) ×2 IMPLANT
BAG COUNTER SPONGE SURGICOUNT (BAG) ×2 IMPLANT
BIOPATCH RED 1 DISK 7.0 (GAUZE/BANDAGES/DRESSINGS) IMPLANT
BNDG ELASTIC 4INX 5YD STR LF (GAUZE/BANDAGES/DRESSINGS) IMPLANT
BNDG ELASTIC 4X5.8 VLCR STR LF (GAUZE/BANDAGES/DRESSINGS) ×2 IMPLANT
BNDG ELASTIC 6INX 5YD STR LF (GAUZE/BANDAGES/DRESSINGS) IMPLANT
CANISTER PREVENA PLUS 150 (CANNISTER) IMPLANT
CANISTER SUCT 3000ML PPV (MISCELLANEOUS) ×2 IMPLANT
CATH PALINDROME-P 23CM W/VT (CATHETERS) IMPLANT
CLIP TI MEDIUM 24 (CLIP) ×2 IMPLANT
CLIP TI MEDIUM 6 (CLIP) ×4 IMPLANT
CLIP TI WIDE RED SMALL 24 (CLIP) ×2 IMPLANT
CLIP TI WIDE RED SMALL 6 (CLIP) ×2 IMPLANT
CNTNR URN SCR LID CUP LEK RST (MISCELLANEOUS) IMPLANT
CONT SPEC 4OZ STRL OR WHT (MISCELLANEOUS) ×2
COVER PROBE W GEL 5X96 (DRAPES) IMPLANT
DERMABOND ADVANCED .7 DNX12 (GAUZE/BANDAGES/DRESSINGS) ×2 IMPLANT
DERMABOND ADVANCED .7 DNX6 (GAUZE/BANDAGES/DRESSINGS) IMPLANT
DRAIN CHANNEL 15F RND FF W/TCR (WOUND CARE) IMPLANT
DRAPE C-ARM 42X72 X-RAY (DRAPES) IMPLANT
DRAPE INCISE IOBAN 66X45 STRL (DRAPES) IMPLANT
DRESSING PEEL AND PLC PRVNA 13 (GAUZE/BANDAGES/DRESSINGS) IMPLANT
DRSG PEEL AND PLACE PREVENA 13 (GAUZE/BANDAGES/DRESSINGS) ×2
ELECT REM PT RETURN 9FT ADLT (ELECTROSURGICAL) ×2
ELECTRODE REM PT RTRN 9FT ADLT (ELECTROSURGICAL) ×2 IMPLANT
EVACUATOR SILICONE 100CC (DRAIN) IMPLANT
FELT TEFLON 1X6 (MISCELLANEOUS) IMPLANT
GLOVE BIO SURGEON STRL SZ7 (GLOVE) IMPLANT
GLOVE BIOGEL PI IND STRL 8 (GLOVE) ×2 IMPLANT
GLOVE SURG SS PI 7.0 STRL IVOR (GLOVE) IMPLANT
GLOVE SURG SS PI 7.5 STRL IVOR (GLOVE) IMPLANT
GOWN STRL REUS W/ TWL LRG LVL3 (GOWN DISPOSABLE) ×4 IMPLANT
GOWN STRL REUS W/TWL 2XL LVL3 (GOWN DISPOSABLE) ×4 IMPLANT
GOWN STRL REUS W/TWL LRG LVL3 (GOWN DISPOSABLE) ×6
HEMOSTAT SNOW SURGICEL 2X4 (HEMOSTASIS) IMPLANT
KIT BASIN OR (CUSTOM PROCEDURE TRAY) ×2 IMPLANT
KIT TURNOVER KIT B (KITS) ×2 IMPLANT
NS IRRIG 1000ML POUR BTL (IV SOLUTION) ×2 IMPLANT
PACK CV ACCESS (CUSTOM PROCEDURE TRAY) ×2 IMPLANT
PAD ARMBOARD 7.5X6 YLW CONV (MISCELLANEOUS) ×4 IMPLANT
SET MICROPUNCTURE 5F STIFF (MISCELLANEOUS) IMPLANT
SLING ARM FOAM STRAP LRG (SOFTGOODS) IMPLANT
SLING ARM FOAM STRAP MED (SOFTGOODS) IMPLANT
SPIKE FLUID TRANSFER (MISCELLANEOUS) ×2 IMPLANT
STAPLER VISISTAT 35W (STAPLE) IMPLANT
SUT ETHILON 3 0 PS 1 (SUTURE) IMPLANT
SUT MNCRL AB 4-0 PS2 18 (SUTURE) ×2 IMPLANT
SUT PROLENE 4 0 RB 1 (SUTURE) ×2
SUT PROLENE 4-0 RB1 .5 CRCL 36 (SUTURE) IMPLANT
SUT PROLENE 5 0 C 1 24 (SUTURE) IMPLANT
SUT PROLENE 6 0 BV (SUTURE) IMPLANT
SUT PROLENE 7 0 BV 1 (SUTURE) IMPLANT
SUT SILK 3 0 SH CR/8 (SUTURE) ×2 IMPLANT
SUT VIC AB 2-0 CT1 27 (SUTURE) ×8
SUT VIC AB 2-0 CT1 TAPERPNT 27 (SUTURE) IMPLANT
SUT VIC AB 3-0 SH 27 (SUTURE) ×2
SUT VIC AB 3-0 SH 27X BRD (SUTURE) ×2 IMPLANT
SYR 10ML LL (SYRINGE) IMPLANT
TOWEL GREEN STERILE (TOWEL DISPOSABLE) ×2 IMPLANT
UNDERPAD 30X36 HEAVY ABSORB (UNDERPADS AND DIAPERS) ×2 IMPLANT
WATER STERILE IRR 1000ML POUR (IV SOLUTION) ×2 IMPLANT
WIRE AMPLATZ SS-J .035X180CM (WIRE) IMPLANT

## 2023-05-26 NOTE — Anesthesia Procedure Notes (Signed)
Procedure Name: Intubation Date/Time: 05/26/2023 10:48 AM  Performed by: Elliot Dally, CRNAPre-anesthesia Checklist: Patient identified, Emergency Drugs available, Suction available and Patient being monitored Patient Re-evaluated:Patient Re-evaluated prior to induction Oxygen Delivery Method: Circle System Utilized Preoxygenation: Pre-oxygenation with 100% oxygen Induction Type: IV induction Laryngoscope Size: Glidescope and 3 Grade View: Grade I Tube type: Oral Tube size: 7.0 mm Number of attempts: 1 Airway Equipment and Method: Stylet and Oral airway Placement Confirmation: ETT inserted through vocal cords under direct vision, positive ETCO2 and breath sounds checked- equal and bilateral Secured at: 21 cm Tube secured with: Tape Dental Injury: Teeth and Oropharynx as per pre-operative assessment

## 2023-05-26 NOTE — Op Note (Signed)
NAMEMelisia [name]    MRN: [id number] DOB: October 25, 1971    DATE OF OPERATION: 05/26/2023  PREOP DIAGNOSIS:    Left arm hematoma  POSTOP DIAGNOSIS:    Same  PROCEDURE:    Left arm hematoma evacuation, fistula revision Tunneled dialysis catheter-palindrome 23 cm left internal jugular vein  SURGEON: Victorino Sparrow  ASSIST: Juanda Chance, PA  ANESTHESIA: General  EBL: 200  INDICATIONS:    Patricia [name] is a 52 y.o. female who presented to the emergency department with new onset left upper arm swelling status post dialysis.  She had been infiltrated with a large hematoma compressing the fistula, as well as nerve compression causing hand tingling.  After discussing risks and benefits of left arm hematoma evacuation, possible fistula revision, tunneled dialysis catheter placement, she elected to proceed.  FINDINGS:   Right internal jugular vein occluded Tunneled dialysis catheter placement 23 cm palindrome left internal jugular vein  Left arm hematoma evacuation Pseudoaneurysm at the lateral side of the brachiocephalic fistula repaired primarily with plication  TECHNIQUE:   Patient brought to the OR laid in supine position.  General anesthesia was induced and patient's prepped draped standard fashion.  Using ultrasound guidance the right internal jugular vein was accessed with micropuncture technique.  The wire would not pace and appeared to be occluded. I then moved to the internal jugular vein.  This was accessed using a micropuncture wire under ultrasound guidance.  Through the micropuncture sheath, a floppy J-wire was advanced into the superior vena cava.  A small incision was made around the skin access point.  A counterincision was made in the chest under the clavicle.  A 23 cm tunneled dialysis catheter was then tunneled under the skin, over the clavicle into the incision in the neck.  The access point was serially dilated under direct fluoroscopic guidance.  A peel-away sheath  was introduced into the superior vena cava under fluoroscopic guidance.  The tunneling device was removed and the catheter fed through the peel-away sheath into the superior vena cava.  The peel-away sheath was removed and the catheter gently pulled back.  Adequate position was confirmed with x-ray.  The catheter was tested and found to flush and draw back well.  Catheter was heparin locked.  Caps were applied.  Catheter was sutured to the skin.  The neck incision was closed with 4-0 Monocryl.  Next, I moved to hematoma evacuation, fistula revision.  2 counterincisions were made proximally and distally to the large, grapefruit sized hematoma.  One of the longitudinal incisions was made in the antecubital fossa for fistula control.  Next, I moved to the shoulder, where I exposed the outflow cephalic vein in the upper arm through a 4 cm incision.   Ultrasound was used to locate the hematoma as well as the presumptive pseudoaneurysm.  This was marked.  A 10 cm longitudinal incision was made along the fistula.  The native fistula was appreciated.  Deep to the fistula there was significant hematoma and active bleeding.  The lateral wall of the fistula demonstrated a pseudoaneurysm.  The fistula was clamped, and pseudoaneurysm repaired with 4-0 Prolene suture in running fashion for roughly 1 cm in length.  I then moved to hematoma evacuation which was significant-roughly the size of an orange.  Thrombin product and DDAVP were used to control bleeding of raw surfaces.  I elected to place a 15 cm JP drain in the wound bed.  Both the lateral and medial aspects were undermined in  an effort to close over the large fistula.   Once hemostasis was achieved, I elected to leave some thrombin product in the base of the wound.  The wound was closed using 2-0 Vicryl suture with staples at the level of the skin. A Prevena VAC dressing was placed.   One month fistula rest at minimum.    Ladonna Snide, MD [facility] DATE OF DICTATION:   05/26/2023

## 2023-05-26 NOTE — Discharge Instructions (Signed)
Patricia [name],  You were in the hospital with a hematoma noted in your arm. This was treated in the operating room. Please follow-up with the vascular surgeon for continued management and follow-up on your wound. Please follow-up with your PCP in one week and your nephrologist as previously scheduled.

## 2023-05-26 NOTE — ED Provider Notes (Signed)
[place] EMERGENCY DEPARTMENT AT [facility] Provider Note   CSN: [id number] Arrival date & time: 05/26/23  0240     History  Chief Complaint  Patient presents with   Vascular Access Problem    Patricia [name] is a 52 y.o. female.  HPI Patient presents for arm swelling. Medical history includes ESRD, HTN, CHF, OSA, GERD, DM, HLD, asthma, anemia, arthritis.  She typically undergoes dialysis on M, W, F.  She missed dialysis on Friday due to cataract surgery.  She had a make-up session today.  She states that initial access to her fistula was unsuccessful and another tech came to access it more proximal on her arm than usual.  She did undergo full session of dialysis.  When she got home, she noticed swelling in the area of the proximal fistula.  She called her dialysis center and they told her to put ice on it.  She has been putting ice on it.  Swelling has been persistent.  It is painful.  Pain is focused on area of swelling.  Pain is worsened with movements of her arm.    Home Medications Prior to Admission medications   Medication Sig Start Date End Date Taking? Authorizing Provider  acetaminophen (TYLENOL) 325 MG tablet Take 650 mg by mouth every 6 (six) hours as needed for mild pain or fever.    [provider]  albuterol (PROVENTIL) (2.5 MG/3ML) 0.083% nebulizer solution Take 3 mLs (2.5 mg total) by nebulization every 6 (six) hours as needed for wheezing or shortness of breath. 11/28/17   Erick Blinks, MD  albuterol (VENTOLIN HFA) 108 (90 Base) MCG/ACT inhaler Inhale 2 puffs into the lungs every 6 (six) hours as needed for wheezing or shortness of breath. 01/15/23   Parrett, Virgel Bouquet, NP  aspirin EC 81 MG tablet Take 1 tablet (81 mg total) by mouth daily. Swallow whole. 12/07/21   Pricilla Riffle, MD  Azelastine HCl (ASTEPRO) 0.15 % SOLN Place 1 spray into the nose at bedtime as needed (allergies). 04/16/23   Coralyn Helling, MD  cetirizine (ZYRTEC) 10 MG tablet Take 1  tablet (10 mg total) by mouth daily. 01/28/23   Coralyn Helling, MD  Cholecalciferol (VITAMIN D3) 125 MCG (5000 UT) CAPS TAKE 1 CAPSULE BY MOUTH EVERY DAY 09/13/22   Roma Kayser, MD  cinacalcet (SENSIPAR) 30 MG tablet Take 30 mg by mouth daily with breakfast.    [provider]  dicyclomine (BENTYL) 10 MG capsule Take 1 capsule (10 mg total) by mouth 2 (two) times daily as needed. Patient taking differently: Take 10 mg by mouth 2 (two) times daily as needed for spasms. 03/20/22   Gelene Mink, NP  docusate sodium (COLACE) 100 MG capsule Take 100 mg by mouth daily.    [provider]  ferric citrate (AURYXIA) 1 GM 210 MG(Fe) tablet Take 420 mg by mouth 2 (two) times daily with a meal. With Breakfast & with supper    [provider]  glipiZIDE (GLUCOTROL) 5 MG tablet TAKE 1 TABLET(5 MG) BY MOUTH TWICE DAILY BEFORE A MEAL 05/21/23   Nida, Denman George, MD  glipiZIDE (GLUCOTROL) 5 MG tablet Take 1 tablet (5 mg total) by mouth daily before breakfast. 05/21/23   Nida, Denman George, MD  insulin degludec (TRESIBA FLEXTOUCH) 200 UNIT/ML FlexTouch Pen Inject 20 Units into the skin at bedtime. 03/21/23   Roma Kayser, MD  ipratropium (ATROVENT) 0.03 % nasal spray Place 2 sprays into both nostrils 2 (  two) times daily.  06/06/19   [provider]  irbesartan (AVAPRO) 75 MG tablet Take 75 mg by mouth daily. 01/18/23   [provider]  labetalol (NORMODYNE) 100 MG tablet Take 100 mg by mouth 2 (two) times daily.    [provider]  lanthanum (FOSRENOL) 500 MG chewable tablet Chew 1,000 mg by mouth 3 (three) times daily with meals.    [provider]  linaclotide (LINZESS) 290 MCG CAPS capsule Take 290 mcg by mouth daily before breakfast.    [provider]  montelukast (SINGULAIR) 10 MG tablet Take 1 tablet (10 mg total) by mouth at bedtime. 04/22/23   Coralyn Helling, MD  multivitamin (RENA-VIT) TABS tablet Take 1 tablet by mouth daily.     [provider]  NIFEdipine (ADALAT CC) 30 MG 24 hr tablet Take 30 mg by mouth at bedtime. 11/26/21   [provider]  nitroGLYCERIN (NITROSTAT) 0.4 MG SL tablet Place 1 tablet (0.4 mg total) under the tongue every 5 (five) minutes as needed for chest pain. 11/28/22 11/28/23  Rai, Ripudeep K, MD  ondansetron (ZOFRAN-ODT) 4 MG disintegrating tablet Take 1 tablet (4 mg total) by mouth every 8 (eight) hours as needed for nausea or vomiting. 02/19/23   Gelene Mink, NP  OXYGEN Inhale 2 L into the lungs as needed (exertion).    [provider]  pantoprazole (PROTONIX) 40 MG tablet TAKE 1 TABLET (40 MG TOTAL) BY MOUTH DAILY 30 MINUTES BEFORE BREAKFAST 01/10/23   Rourk, Gerrit Friends, MD  rOPINIRole (REQUIP XL) 2 MG 24 hr tablet Take 2 mg by mouth at bedtime.  09/10/16   [provider]  rosuvastatin (CRESTOR) 20 MG tablet Take 20 mg by mouth daily.    [provider]  sodium chloride (OCEAN) 0.65 % SOLN nasal spray Place 1 spray into both nostrils as needed for congestion. 01/02/20   Hongalgi, Maximino Greenland, MD  SYMBICORT 160-4.5 MCG/ACT inhaler Inhale 2 puffs into the lungs daily. 04/20/22   [provider]      Allergies    Amlodipine besylate and Reglan [metoclopramide]    Review of Systems   Review of Systems  Musculoskeletal:        Painful swelling to proximal left arm  All other systems reviewed and are negative.   Physical Exam Updated Vital Signs BP (!) 165/81 (BP Location: Right Wrist)   Pulse 85   Temp 98.4 F (36.9 C) (Oral)   Resp 18   Ht 5\' 3"  (1.6 m)   Wt 110.2 kg   SpO2 99%   BMI 43.05 kg/m  Physical Exam Vitals and nursing note reviewed.  Constitutional:      General: She is not in acute distress.    Appearance: Normal appearance. She is well-developed. She is not ill-appearing, toxic-appearing or diaphoretic.  HENT:     Head: Normocephalic and atraumatic.     Right Ear: External ear normal.     Left Ear: External ear normal.      Nose: Nose normal.     Mouth/Throat:     Mouth: Mucous membranes are moist.  Eyes:     Extraocular Movements: Extraocular movements intact.     Conjunctiva/sclera: Conjunctivae normal.  Cardiovascular:     Rate and Rhythm: Normal rate and regular rhythm.     Comments: Large area of swelling proximal to left upper extremity fistula.  Fistula thrill remains present. Pulmonary:     Effort: Pulmonary effort is normal. No respiratory distress.  Abdominal:     General: There is no distension.     Palpations: Abdomen is soft.  Musculoskeletal:        General: Swelling and tenderness present.     Cervical back: No rigidity.  Skin:    General: Skin is warm and dry.     Capillary Refill: Capillary refill takes less than 2 seconds.     Coloration: Skin is not jaundiced or pale.  Neurological:     General: No focal deficit present.     Mental Status: She is alert and oriented to person, place, and time.  Psychiatric:        Mood and Affect: Mood normal.        Behavior: Behavior normal.     ED Results / Procedures / Treatments   Labs (all labs ordered are listed, but only abnormal results are displayed) Labs Reviewed - No data to display  EKG None  Radiology No results found.  Procedures Procedures    Medications Ordered in ED Medications  oxyCODONE-acetaminophen (PERCOCET/ROXICET) 5-325 MG per tablet 1 tablet (1 tablet Oral Given 05/26/23 2130)    ED Course/ Medical Decision Making/ A&P                             Medical Decision Making Risk Prescription drug management.   This patient presents to the ED for concern of left arm swelling, this involves an extensive number of treatment options, and is a complaint that carries with it a high risk of complications and morbidity.  The differential diagnosis includes fistula complication, aneurysm, hematoma   Co morbidities that complicate the patient evaluation  ESRD, HTN, CHF, OSA, GERD, DM, HLD, asthma, anemia,  arthritis   Additional history obtained:  Additional history obtained from N/A External records from outside source obtained and reviewed including EMR   Consultations Obtained:  I requested consultation with the vascular surgeon, Dr. Karin Lieu,  and discussed lab and imaging findings as well as pertinent plan - they recommend: Transfer to [facility] for vascular surgery evaluation   Problem List / ED Course / Critical interventions / Medication management  Patient presents for proximal left arm swelling.  This did follow a session of dialysis earlier today.  Patient has left upper extremity fistula that was placed in 2016.  She was in her normal state of health earlier today.  Following dialysis session today, she had swelling in an area proximal of fistula.  On arrival in the ED, large swelling is noted in left bicep area.  Thrill is present area overlying fistula.  On bedside ultrasound, there does appear to be patent vasculature under area of swelling.  There appears to be perivascular hematoma.  Percocet was ordered for analgesia.  Vascular surgery was consulted.  I spoke with vascular surgeon on-call, Dr. Karin Lieu, who requested ED to ED transfer.  Patient was accepted in transfer by Dr. Manus Gunning.  On reassessment, patient continued to endorse pain.  Additional Percocet was ordered.  Patient preferred transfer by POV.  Her significant other is at bedside and will provide transportation. I ordered medication including Percocet for analgesia.   Reevaluation of the patient after these medicines showed that the patient improved I have reviewed the patients home medicines and have made adjustments as needed   Social Determinants of Health:  Has access to outpatient care         Final Clinical Impression(s) / ED Diagnoses Final diagnoses:  Left  arm swelling    Rx / DC Orders ED Discharge Orders     None         Gloris Manchester, MD 05/26/23 [phone]

## 2023-05-26 NOTE — ED Provider Notes (Signed)
  Physical Exam  BP (!) 125/51 (BP Location: Right Arm)   Pulse 86   Temp 97.7 F (36.5 C) (Oral)   Resp 18   Ht 5\' 3"  (1.6 m)   Wt 112.1 kg   SpO2 97%   BMI 43.78 kg/m   Physical Exam  Procedures  Procedures  ED Course / MDM   Clinical Course as of 05/26/23 1817  Wynelle Link May 26, 2023  1000 Dr Katrinka Blazing from hospitalist to admit the patient.  Dr. Karin Lieu is taken the patient to the OR prior to her ultrasound for hematoma evacuation and fistula repair. [RP]    Clinical Course User Index [RP] Rondel Baton, MD   Medical Decision Making Amount and/or Complexity of Data Reviewed Labs: ordered.  Risk Prescription drug management. Decision regarding hospitalization.   52 year old female with a history of ESRD on Monday Wednesday Friday dialysis who presents emergency department with swelling of her left arm after her fistula was accessed for dialysis yesterday.  Says that she only had a partial session due to hypotension.  When the catheter was removed had some swelling of her left arm.  Went to [facility] last night was found to have a very fistular hematoma and vascular surgery was consulted who recommended transfer to the [facility] emergency department for evaluation.  Will place her for a formal ultrasound at this point in time.  Does appear to have some swelling near her fistula site still has palpable thrill and bruit that was auscultated.  Radial pulse 2+ distally.  Full strength and intact sensation of her left hand.  Will reach out to vascular surgery at this point in time as well.  Vascular surgery ended up taking the patient to the OR and medicine will admit the patient after the operation.   Rondel Baton, MD 05/26/23 [phone]

## 2023-05-26 NOTE — Anesthesia Postprocedure Evaluation (Signed)
Anesthesia Post Note  Patient: Patricia [name]  Procedure(s) Performed: REVISON OF ARTERIOVENOUS FISTULA LEFT ARM HEMATOMA WASH OUT (Left: Arm Upper) INSERTION OF Left internal jugular DIALYSIS CATHETER (Neck) APPLICATION OF WOUND VAC (Left: Arm Upper)     Patient location during evaluation: PACU Anesthesia Type: General Level of consciousness: awake and alert Pain management: pain level controlled Vital Signs Assessment: post-procedure vital signs reviewed and stable Respiratory status: spontaneous breathing, nonlabored ventilation, respiratory function stable and patient connected to nasal cannula oxygen Cardiovascular status: blood pressure returned to baseline and stable Postop Assessment: no apparent nausea or vomiting Anesthetic complications: no   No notable events documented.  Last Vitals:  Vitals:   05/26/23 1409 05/26/23 1425  BP:  (!) 125/51  Pulse: 86 86  Resp: (!) 24 18  Temp: 36.4 C 36.5 C  SpO2: 94% 97%    Last Pain:  Vitals:   05/26/23 1534  TempSrc:   PainSc: Asleep                 Earl Lites P Cully Luckow

## 2023-05-26 NOTE — ED Triage Notes (Signed)
Pt states she went to dialysis 6/8 and since has been swollen at fistula site in left upper arm. Normal dialysis days are MWF, but pt had cataract surgery Friday which delayed dialysis to Saturday. States the nurse at dialysis had difficulty accessing site.

## 2023-05-26 NOTE — ED Notes (Signed)
Pt to PACU.

## 2023-05-26 NOTE — H&P (Addendum)
History and Physical    PatientBirda [name] ZOX:[id number] DOB: 1971-09-08 DOA: 05/26/2023 DOS: the patient was seen and examined on 05/26/2023 PCP: [name], PA-C  Patient coming from: Transfer from [facility]  Chief Complaint:  Chief Complaint  Patient presents with   Vascular Access Problem   HPI: Patricia [name] is a 52 y.o. female with medical history significant of hypertension, chronic respiratory failure on 2 L of oxygen, ESRD on HD, OSA on CPAP who presented for left arm swelling after a fistula access for hemodialysis yesterday.  Patient notes that she was unable to complete the pull due to the arteriovenous alarm going off yesterday during her session.  She is normally dialyzes in [place] at [facility] and is followed by Dr. Thedore Mins.  He said that she developed severe swelling of her left arm patient reported having severe pain that she rated as a 10 out of 10.  Noted associated symptoms of numbness and tingling in her fingers in her left hand.  She reports being on dialysis several years and never having this happen before.  She had just recently had cataract extraction with intraocular lens implant phacoemulsification surgery on her right eye 2 days ago with Dr. June Leap.  In the emergency department patient was noted to be afebrile with blood pressures elevated up to 165/81 and all other vital signs maintained.  Labs significant for WBC 12.4, hemoglobin 9.6, BUN 47, creatinine 9.4, and glucose 193.  The left upper extremity was evaluated and noted to have concern for fistular hematoma vascular surgery had been consulted and recommended transfer to [facility].  Dr. Sherral Hammers of vascular surgery was consulted and took patient to the operating room for further evaluation.  TRH called to admit.  Review of Systems: As mentioned in the history of present illness. All other systems reviewed and are negative. Past Medical History:  Diagnosis Date   Anemia    Ankle fracture     Arthritis    Asthma    Blood transfusion without reported diagnosis    Breast cancer (HCC)    Cancer (HCC)    COVID    Diabetes mellitus without complication (HCC)    Dialysis patient (HCC)    mon, wed, friday,    Dyspnea    End stage renal disease on dialysis (HCC)    M/W/F [facility] in [place]   GERD (gastroesophageal reflux disease)    Hypertension    OSA (obstructive sleep apnea)    uses CPAP sometimes   Pneumonia    PONV (postoperative nausea and vomiting)    Wears glasses    Past Surgical History:  Procedure Laterality Date   ABDOMINAL HYSTERECTOMY     AV FISTULA PLACEMENT  11/2014   at [facility]   AV FISTULA REPAIR N/A 04/2021   BALLOON DILATION N/A 07/10/2016   Procedure: BALLOON DILATION;  Surgeon: West Bali, MD;  Location: [facility] END[facility];  Service: Endoscopy;  Laterality: N/A;  Pyloric dilation   BREAST LUMPECTOMY     CESAREAN SECTION     CHOLECYSTECTOMY     COLONOSCOPY  02/2023   COLONOSCOPY WITH PROPOFOL N/A 09/27/2016   Dr. Jena Gauss: Internal hemorrhoids repeat colonoscopy in 10 years   COLONOSCOPY WITH PROPOFOL N/A 02/28/2023   Procedure: COLONOSCOPY WITH PROPOFOL;  Surgeon: Corbin Ade, MD;  Location: [facility];  Service: Endoscopy;  Laterality: N/A;  11:45 am, asa 3 dialysis pt   DILATION AND CURETTAGE OF UTERUS     ESOPHAGOGASTRODUODENOSCOPY N/A  07/10/2016   Dr.Fields- normal esophagus, gastric stenosis was found at the pylorus, gastritis on bx, normal examined duodenun   ESOPHAGOGASTRODUODENOSCOPY (EGD) WITH PROPOFOL N/A 11/21/2022   Procedure: ESOPHAGOGASTRODUODENOSCOPY (EGD) WITH PROPOFOL;  Surgeon: Dolores Frame, MD;  Location: [facility];  Service: Gastroenterology;  Laterality: N/A;   EXTERNAL FIXATION REMOVAL Right 10/29/2018   Procedure: REMOVAL RIGHT ANKLE BIOMET ZIMMER EXTERNAL FIXATOR, SHORT LEG CAST APPLICATION;  Surgeon: Eldred Manges, MD;  Location: [facility] OR;  Service: Orthopedics;  Laterality: Right;   MASTECTOMY      left sided   ORIF ANKLE FRACTURE Right 10/06/2018   Procedure: OPEN REDUCTION INTERNAL FIXATION (ORIF) RIGHT ANKLE TRIMALLEOLAR;  Surgeon: Eldred Manges, MD;  Location: [facility] OR;  Service: Orthopedics;  Laterality: Right;   RIGHT/LEFT HEART CATH AND CORONARY ANGIOGRAPHY N/A 11/27/2022   Procedure: RIGHT/LEFT HEART CATH AND CORONARY ANGIOGRAPHY;  Surgeon: Swaziland, Peter M, MD;  Location: [facility] INVASIVE CV LAB;  Service: Cardiovascular;  Laterality: N/A;   Social History:  reports that she has never smoked. She has never used smokeless tobacco. She reports that she does not drink alcohol and does not use drugs.  Allergies  Allergen Reactions   Amlodipine Besylate Rash and Other (See Comments)    dizziness   Reglan [Metoclopramide] Other (See Comments)    hallucinations     Family History  Problem Relation Age of Onset   Diabetes Mellitus II Mother    Hypertension Mother    Heart block Mother    Hypertension Sister    Hypertension Sister    Colon cancer Neg Hx     Prior to Admission medications   Medication Sig Start Date End Date Taking? Authorizing Provider  acetaminophen (TYLENOL) 325 MG tablet Take 650 mg by mouth every 6 (six) hours as needed for mild pain or fever.    [provider]  albuterol (PROVENTIL) (2.5 MG/3ML) 0.083% nebulizer solution Take 3 mLs (2.5 mg total) by nebulization every 6 (six) hours as needed for wheezing or shortness of breath. 11/28/17   Erick Blinks, MD  albuterol (VENTOLIN HFA) 108 (90 Base) MCG/ACT inhaler Inhale 2 puffs into the lungs every 6 (six) hours as needed for wheezing or shortness of breath. 01/15/23   Parrett, Virgel Bouquet, NP  aspirin EC 81 MG tablet Take 1 tablet (81 mg total) by mouth daily. Swallow whole. 12/07/21   Pricilla Riffle, MD  Azelastine HCl (ASTEPRO) 0.15 % SOLN Place 1 spray into the nose at bedtime as needed (allergies). 04/16/23   Coralyn Helling, MD  cetirizine (ZYRTEC) 10 MG tablet Take 1 tablet (10 mg total) by mouth daily.  01/28/23   Coralyn Helling, MD  Cholecalciferol (VITAMIN D3) 125 MCG (5000 UT) CAPS TAKE 1 CAPSULE BY MOUTH EVERY DAY 09/13/22   Roma Kayser, MD  cinacalcet (SENSIPAR) 30 MG tablet Take 30 mg by mouth daily with breakfast.    [provider]  dicyclomine (BENTYL) 10 MG capsule Take 1 capsule (10 mg total) by mouth 2 (two) times daily as needed. Patient taking differently: Take 10 mg by mouth 2 (two) times daily as needed for spasms. 03/20/22   Gelene Mink, NP  docusate sodium (COLACE) 100 MG capsule Take 100 mg by mouth daily.    [provider]  ferric citrate (AURYXIA) 1 GM 210 MG(Fe) tablet Take 420 mg by mouth 2 (two) times daily with a meal. With Breakfast & with supper    [provider]  glipiZIDE (GLUCOTROL) 5  MG tablet TAKE 1 TABLET(5 MG) BY MOUTH TWICE DAILY BEFORE A MEAL 05/21/23   Nida, Denman George, MD  glipiZIDE (GLUCOTROL) 5 MG tablet Take 1 tablet (5 mg total) by mouth daily before breakfast. 05/21/23   Nida, Denman George, MD  insulin degludec (TRESIBA FLEXTOUCH) 200 UNIT/ML FlexTouch Pen Inject 20 Units into the skin at bedtime. 03/21/23   Roma Kayser, MD  ipratropium (ATROVENT) 0.03 % nasal spray Place 2 sprays into both nostrils 2 (two) times daily.  06/06/19   [provider]  irbesartan (AVAPRO) 75 MG tablet Take 75 mg by mouth daily. 01/18/23   [provider]  labetalol (NORMODYNE) 100 MG tablet Take 100 mg by mouth 2 (two) times daily.    [provider]  lanthanum (FOSRENOL) 500 MG chewable tablet Chew 1,000 mg by mouth 3 (three) times daily with meals.    [provider]  linaclotide (LINZESS) 290 MCG CAPS capsule Take 290 mcg by mouth daily before breakfast.    [provider]  montelukast (SINGULAIR) 10 MG tablet Take 1 tablet (10 mg total) by mouth at bedtime. 04/22/23   Coralyn Helling, MD  multivitamin (RENA-VIT) TABS tablet Take 1 tablet by mouth daily.    [provider]   NIFEdipine (ADALAT CC) 30 MG 24 hr tablet Take 30 mg by mouth at bedtime. 11/26/21   [provider]  nitroGLYCERIN (NITROSTAT) 0.4 MG SL tablet Place 1 tablet (0.4 mg total) under the tongue every 5 (five) minutes as needed for chest pain. 11/28/22 11/28/23  Rai, Ripudeep K, MD  ondansetron (ZOFRAN-ODT) 4 MG disintegrating tablet Take 1 tablet (4 mg total) by mouth every 8 (eight) hours as needed for nausea or vomiting. 02/19/23   Gelene Mink, NP  OXYGEN Inhale 2 L into the lungs as needed (exertion).    [provider]  pantoprazole (PROTONIX) 40 MG tablet TAKE 1 TABLET (40 MG TOTAL) BY MOUTH DAILY 30 MINUTES BEFORE BREAKFAST 01/10/23   Rourk, Gerrit Friends, MD  rOPINIRole (REQUIP XL) 2 MG 24 hr tablet Take 2 mg by mouth at bedtime.  09/10/16   [provider]  rosuvastatin (CRESTOR) 20 MG tablet Take 20 mg by mouth daily.    [provider]  sodium chloride (OCEAN) 0.65 % SOLN nasal spray Place 1 spray into both nostrils as needed for congestion. 01/02/20   Hongalgi, Maximino Greenland, MD  SYMBICORT 160-4.5 MCG/ACT inhaler Inhale 2 puffs into the lungs daily. 04/20/22   [provider]    Physical Exam: Vitals:   05/26/23 0303 05/26/23 0430 05/26/23 0617 05/26/23 0958  BP:  (!) 122/57 (!) 149/77 (!) 146/73  Pulse:  81 86 80  Resp:   20 20  Temp:   98.4 F (36.9 C)   TempSrc: Oral     SpO2:  96% 97% 100%  Weight:      Height:       Constitutional: Middle-age female currently in no acute distress Eyes: shield over the right eye post cataract surgery. ENMT: Mucous membranes are moist. Posterior pharynx clear of any exudate or lesions.Normal dentition.  Neck: normal, supple Respiratory: clear to auscultation bilaterally, no wheezing, no crackles. Normal respiratory effort. No accessory muscle use.  Cardiovascular: Regular rate and rhythm, no murmurs / rubs / gallops 2+ pedal pulses.  Left arm currently wrapped with drain in place.  Hemodialysis catheter of the  left chest wall Abdomen: no tenderness, no masses palpated. Bowel sounds positive.  Musculoskeletal: no clubbing /  cyanosis. No joint deformity upper and lower extremities. Good ROM, no contractures. Normal muscle tone.  Skin: no rashes, lesions, ulcers. No induration Neurologic: CN 2-12 grossly intact.  Psychiatric: Normal judgment and insight. Alert and oriented x 3.   Data Reviewed:  Reviewed labs, imaging, and pertinent records as noted above in HPI.  Assessment and Plan:  Complication of AV fistula graft Acute.  Patient presents with swelling of the left AV fistula graft.  Noted to have concern for infiltration and large hematoma of her fistula graft.  Patient was taken to the operating room for evacuation of the fistula with drain placement Dr. Sherral Hammers. -Admit to a medical telemetry bed -Oxycodone/Dilaudid as needed for moderate to severe pain respectively -Appreciate Dr. Sherral Hammers, will follow-up for any further recommendations  ESRD on HD Patient reportedly had a partial session yesterday due to hypotension. -Check renal function panel daily -Dr. Arta Silence of nephrology aware  Chronic respiratory failure hypoxia Patient reports being on 2 L of nasal cannula oxygen 24/7. -Continue 2 L nasal cannula oxygen.  Essential hypertension -Continue home blood pressure regimen as tolerated  Anemia of chronic disease Hemoglobin 9.6, but previously at 11.4 on 02/28/2023. -Recheck CBC in a.m.  Diabetes mellitus type 2 -Hypoglycemic protocols -Hold glipizide -CBGs before every meal with sensitive SSI  GERD -Continue PPI  Cataract surgery -Continue eyedrops  Morbid obesity BMI 43.78 kg/m   Advance Care Planning:   Code Status: Full Code   Consults: Nephrology, vascular surgery  Family Communication: None  Severity of Illness: The appropriate patient status for this patient is INPATIENT. Inpatient status is judged to be reasonable and necessary in order to provide the  required intensity of service to ensure the patient's safety. The patient's presenting symptoms, physical exam findings, and initial radiographic and laboratory data in the context of their chronic comorbidities is felt to place them at high risk for further clinical deterioration. Furthermore, it is not anticipated that the patient will be medically stable for discharge from the hospital within 2 midnights of admission.   * I certify that at the point of admission it is my clinical judgment that the patient will require inpatient hospital care spanning beyond 2 midnights from the point of admission due to high intensity of service, high risk for further deterioration and high frequency of surveillance required.*  Author: Clydie Braun, MD 05/26/2023 9:59 AM  For on call review [email or web address].

## 2023-05-26 NOTE — Anesthesia Preprocedure Evaluation (Addendum)
Anesthesia Evaluation  Patient identified by MRN, date of birth, ID band Patient awake    Reviewed: Allergy & Precautions, NPO status , Patient's Chart, lab work & pertinent test results  History of Anesthesia Complications (+) PONV and history of anesthetic complications  Airway Mallampati: III  TM Distance: >3 FB Neck ROM: Full    Dental  (+) Poor Dentition   Pulmonary asthma , sleep apnea and Continuous Positive Airway Pressure Ventilation    Pulmonary exam normal        Cardiovascular hypertension, Pt. on medications +CHF   Rhythm:Regular Rate:Normal     Neuro/Psych negative neurological ROS  negative psych ROS   GI/Hepatic Neg liver ROS,GERD  Medicated,,  Endo/Other  diabetes, Type 2, Oral Hypoglycemic Agents    Renal/GU ESRF and DialysisRenal disease  negative genitourinary   Musculoskeletal  (+) Arthritis , Osteoarthritis,    Abdominal Normal abdominal exam  (+)   Peds  Hematology  (+) Blood dyscrasia, anemia   Anesthesia Other Findings   Reproductive/Obstetrics                             Anesthesia Physical Anesthesia Plan  ASA: 3  Anesthesia Plan: General   Post-op Pain Management:    Induction: Intravenous and Rapid sequence  PONV Risk Score and Plan: 4 or greater and Ondansetron, Dexamethasone, Midazolam and Treatment may vary due to age or medical condition  Airway Management Planned: Mask, Oral ETT and Video Laryngoscope Planned  Additional Equipment: None  Intra-op Plan:   Post-operative Plan: Extubation in OR  Informed Consent: I have reviewed the patients History and Physical, chart, labs and discussed the procedure including the risks, benefits and alternatives for the proposed anesthesia with the patient or authorized representative who has indicated his/her understanding and acceptance.     Dental advisory given  Plan Discussed with:  CRNA  Anesthesia Plan Comments:        Anesthesia Quick Evaluation

## 2023-05-26 NOTE — Progress Notes (Signed)
NEW ADMISSION NOTE New Admission Note:   Arrival Method: patient came from PACU Mental Orientation:  alert and oriented x 4. Telemetry: N/A Assessment: Completed Skin: warm, dry and intact.  Left arm ace wrapped from top to bottom with JP drain and Prevena wound vac. Patient reassures that she doesn't have any open areas or rash on her back as she is unable to turn at this time due to pain. IV: R H SL Pain: 9/10, will administer pain med as soon as pharmacy verifies. Tubes: JP drain and wound vac Safety Measures: Safety Fall Prevention Plan has been given, discussed and signed Admission: Completed [facility] Orientation: Patient has been orientated to the room, unit and staff.  Family: NOne.  Orders have been reviewed and implemented. Will continue to monitor the patient. Call light has been placed within reach and bed alarm has been activated.   Arvilla Meres, RN

## 2023-05-26 NOTE — Consult Note (Signed)
Hospital Consult    Reason for Consult: Left arm hematoma Requesting Physician: ED MRN #:  [id number]  History of Present Illness: This is a 52 y.o. female status post dialysis yesterday who presents with left arm fistula infiltration and large hematoma.  Her initial left-sided brachiocephalic fistula was placed at [facility] over 8 years ago.  She has had no issues.  Yesterday, she went for a dialysis treatment, and had severe swelling status post decannulation.  She presented to the [facility] ED who transferred her to [facility] for further evaluation.  On exam, she was sitting in pain.  She noted some paresthesias in the fingers which was new.  Past Medical History:  Diagnosis Date   Anemia    Ankle fracture    Arthritis    Asthma    Blood transfusion without reported diagnosis    Breast cancer (HCC)    Cancer (HCC)    COVID    Diabetes mellitus without complication (HCC)    Dialysis patient (HCC)    mon, wed, friday,    Dyspnea    End stage renal disease on dialysis (HCC)    M/W/F [facility] in [place]   GERD (gastroesophageal reflux disease)    Hypertension    OSA (obstructive sleep apnea)    uses CPAP sometimes   Pneumonia    PONV (postoperative nausea and vomiting)    Wears glasses     Past Surgical History:  Procedure Laterality Date   ABDOMINAL HYSTERECTOMY     AV FISTULA PLACEMENT  11/2014   at [facility] hospital   AV FISTULA REPAIR N/A 04/2021   BALLOON DILATION N/A 07/10/2016   Procedure: BALLOON DILATION;  Surgeon: West Bali, MD;  Location: [facility] ENDO SUITE;  Service: Endoscopy;  Laterality: N/A;  Pyloric dilation   BREAST LUMPECTOMY     CESAREAN SECTION     CHOLECYSTECTOMY     COLONOSCOPY  02/2023   COLONOSCOPY WITH PROPOFOL N/A 09/27/2016   Dr. Jena Gauss: Internal hemorrhoids repeat colonoscopy in 10 years   COLONOSCOPY WITH PROPOFOL N/A 02/28/2023   Procedure: COLONOSCOPY WITH PROPOFOL;  Surgeon: Corbin Ade, MD;  Location: [facility];  Service:  Endoscopy;  Laterality: N/A;  11:45 am, asa 3 dialysis pt   DILATION AND CURETTAGE OF UTERUS     ESOPHAGOGASTRODUODENOSCOPY N/A 07/10/2016   Dr.Fields- normal esophagus, gastric stenosis was found at the pylorus, gastritis on bx, normal examined duodenun   ESOPHAGOGASTRODUODENOSCOPY (EGD) WITH PROPOFOL N/A 11/21/2022   Procedure: ESOPHAGOGASTRODUODENOSCOPY (EGD) WITH PROPOFOL;  Surgeon: Dolores Frame, MD;  Location: [facility];  Service: Gastroenterology;  Laterality: N/A;   EXTERNAL FIXATION REMOVAL Right 10/29/2018   Procedure: REMOVAL RIGHT ANKLE BIOMET ZIMMER EXTERNAL FIXATOR, SHORT LEG CAST APPLICATION;  Surgeon: Eldred Manges, MD;  Location: [facility] OR;  Service: Orthopedics;  Laterality: Right;   MASTECTOMY     left sided   ORIF ANKLE FRACTURE Right 10/06/2018   Procedure: OPEN REDUCTION INTERNAL FIXATION (ORIF) RIGHT ANKLE TRIMALLEOLAR;  Surgeon: Eldred Manges, MD;  Location: [facility] OR;  Service: Orthopedics;  Laterality: Right;   RIGHT/LEFT HEART CATH AND CORONARY ANGIOGRAPHY N/A 11/27/2022   Procedure: RIGHT/LEFT HEART CATH AND CORONARY ANGIOGRAPHY;  Surgeon: Swaziland, Peter M, MD;  Location: [facility] INVASIVE CV LAB;  Service: Cardiovascular;  Laterality: N/A;    Allergies  Allergen Reactions   Amlodipine Besylate Rash and Other (See Comments)    dizziness   Reglan [Metoclopramide] Other (See Comments)    hallucinations  Prior to Admission medications   Medication Sig Start Date End Date Taking? Authorizing Provider  acetaminophen (TYLENOL) 325 MG tablet Take 650 mg by mouth every 6 (six) hours as needed for mild pain or fever.    [provider]  albuterol (PROVENTIL) (2.5 MG/3ML) 0.083% nebulizer solution Take 3 mLs (2.5 mg total) by nebulization every 6 (six) hours as needed for wheezing or shortness of breath. 11/28/17   Erick Blinks, MD  albuterol (VENTOLIN HFA) 108 (90 Base) MCG/ACT inhaler Inhale 2 puffs into the lungs every 6 (six) hours as needed for  wheezing or shortness of breath. 01/15/23   Parrett, Virgel Bouquet, NP  aspirin EC 81 MG tablet Take 1 tablet (81 mg total) by mouth daily. Swallow whole. 12/07/21   Pricilla Riffle, MD  Azelastine HCl (ASTEPRO) 0.15 % SOLN Place 1 spray into the nose at bedtime as needed (allergies). 04/16/23   Coralyn Helling, MD  cetirizine (ZYRTEC) 10 MG tablet Take 1 tablet (10 mg total) by mouth daily. 01/28/23   Coralyn Helling, MD  Cholecalciferol (VITAMIN D3) 125 MCG (5000 UT) CAPS TAKE 1 CAPSULE BY MOUTH EVERY DAY 09/13/22   Roma Kayser, MD  cinacalcet (SENSIPAR) 30 MG tablet Take 30 mg by mouth daily with breakfast.    [provider]  dicyclomine (BENTYL) 10 MG capsule Take 1 capsule (10 mg total) by mouth 2 (two) times daily as needed. Patient taking differently: Take 10 mg by mouth 2 (two) times daily as needed for spasms. 03/20/22   Gelene Mink, NP  docusate sodium (COLACE) 100 MG capsule Take 100 mg by mouth daily.    [provider]  ferric citrate (AURYXIA) 1 GM 210 MG(Fe) tablet Take 420 mg by mouth 2 (two) times daily with a meal. With Breakfast & with supper    [provider]  glipiZIDE (GLUCOTROL) 5 MG tablet TAKE 1 TABLET(5 MG) BY MOUTH TWICE DAILY BEFORE A MEAL 05/21/23   Nida, Denman George, MD  glipiZIDE (GLUCOTROL) 5 MG tablet Take 1 tablet (5 mg total) by mouth daily before breakfast. 05/21/23   Nida, Denman George, MD  insulin degludec (TRESIBA FLEXTOUCH) 200 UNIT/ML FlexTouch Pen Inject 20 Units into the skin at bedtime. 03/21/23   Roma Kayser, MD  ipratropium (ATROVENT) 0.03 % nasal spray Place 2 sprays into both nostrils 2 (two) times daily.  06/06/19   [provider]  irbesartan (AVAPRO) 75 MG tablet Take 75 mg by mouth daily. 01/18/23   [provider]  labetalol (NORMODYNE) 100 MG tablet Take 100 mg by mouth 2 (two) times daily.    [provider]  lanthanum (FOSRENOL) 500 MG chewable tablet Chew 1,000 mg by mouth 3 (three) times  daily with meals.    [provider]  linaclotide (LINZESS) 290 MCG CAPS capsule Take 290 mcg by mouth daily before breakfast.    [provider]  montelukast (SINGULAIR) 10 MG tablet Take 1 tablet (10 mg total) by mouth at bedtime. 04/22/23   Coralyn Helling, MD  multivitamin (RENA-VIT) TABS tablet Take 1 tablet by mouth daily.    [provider]  NIFEdipine (ADALAT CC) 30 MG 24 hr tablet Take 30 mg by mouth at bedtime. 11/26/21   [provider]  nitroGLYCERIN (NITROSTAT) 0.4 MG SL tablet Place 1 tablet (0.4 mg total) under the tongue every 5 (five) minutes as needed for chest pain. 11/28/22 11/28/23  Rai, Ripudeep K, MD  ondansetron (ZOFRAN-ODT) 4 MG disintegrating tablet Take  1 tablet (4 mg total) by mouth every 8 (eight) hours as needed for nausea or vomiting. 02/19/23   Gelene Mink, NP  OXYGEN Inhale 2 L into the lungs as needed (exertion).    [provider]  pantoprazole (PROTONIX) 40 MG tablet TAKE 1 TABLET (40 MG TOTAL) BY MOUTH DAILY 30 MINUTES BEFORE BREAKFAST 01/10/23   Rourk, Gerrit Friends, MD  rOPINIRole (REQUIP XL) 2 MG 24 hr tablet Take 2 mg by mouth at bedtime.  09/10/16   [provider]  rosuvastatin (CRESTOR) 20 MG tablet Take 20 mg by mouth daily.    [provider]  sodium chloride (OCEAN) 0.65 % SOLN nasal spray Place 1 spray into both nostrils as needed for congestion. 01/02/20   Hongalgi, Maximino Greenland, MD  SYMBICORT 160-4.5 MCG/ACT inhaler Inhale 2 puffs into the lungs daily. 04/20/22   [provider]    Social History   Socioeconomic History   Marital status: Widowed    Spouse name: Not on file   Number of children: Not on file   Years of education: Not on file   Highest education level: Not on file  Occupational History   Not on file  Tobacco Use   Smoking status: Never   Smokeless tobacco: Never  Vaping Use   Vaping Use: Never used  Substance and Sexual Activity   Alcohol use: No    Alcohol/week: 0.0  standard drinks of alcohol   Drug use: No   Sexual activity: Not Currently  Other Topics Concern   Not on file  Social History Narrative   Not on file   Social Determinants of Health   Financial Resource Strain: Low Risk  (04/18/2023)   Overall Financial Resource Strain (CARDIA)    Difficulty of Paying Living Expenses: Not very hard  Food Insecurity: No Food Insecurity (12/18/2022)   Hunger Vital Sign    Worried About Running Out of Food in the Last Year: Never true    Ran Out of Food in the Last Year: Never true  Transportation Needs: No Transportation Needs (04/18/2023)   PRAPARE - Administrator, Civil Service (Medical): No    Lack of Transportation (Non-Medical): No  Physical Activity: Not on file  Stress: Not on file  Social Connections: Not on file  Intimate Partner Violence: Not At Risk (12/10/2022)   Humiliation, Afraid, Rape, and Kick questionnaire    Fear of Current or Ex-Partner: No    Emotionally Abused: No    Physically Abused: No    Sexually Abused: No    Family History  Problem Relation Age of Onset   Diabetes Mellitus II Mother    Hypertension Mother    Heart block Mother    Hypertension Sister    Hypertension Sister    Colon cancer Neg Hx     ROS: Otherwise negative unless mentioned in HPI  Physical Examination  Vitals:   05/26/23 0430 05/26/23 0617  BP: (!) 122/57 (!) 149/77  Pulse: 81 86  Resp:  20  Temp:  98.4 F (36.9 C)  SpO2: 96% 97%   Body mass index is 43.05 kg/m.  General:  WDWN in NAD Gait: Not observed HENT: WNL, normocephalic Pulmonary: normal non-labored breathing, without Rales, rhonchi,  wheezing Cardiac: regular Abdomen: soft, NT/ND, no masses Skin: without rashes Vascular Exam/Pulses: 2+ radial pulse - large hematoma, roughly the size of a grapefruit. Extremities: without ischemic changes, without Gangrene , without cellulitis; without open wounds;  Musculoskeletal: no muscle wasting  or atrophy  Neurologic: A&O  X 3;  No focal weakness or paresthesias are detected; speech is fluent/normal Psychiatric:  The pt has Normal affect. Lymph:  Unremarkable  CBC    Component Value Date/Time   WBC 10.5 02/28/2023 1029   RBC 3.98 02/28/2023 1029   HGB 11.4 (L) 02/28/2023 1029   HCT 36.9 02/28/2023 1029   PLT 263 02/28/2023 1029   MCV 92.7 02/28/2023 1029   MCH 28.6 02/28/2023 1029   MCHC 30.9 02/28/2023 1029   RDW 13.8 02/28/2023 1029   LYMPHSABS 1.3 02/28/2023 1029   MONOABS 0.6 02/28/2023 1029   EOSABS 0.1 02/28/2023 1029   BASOSABS 0.1 02/28/2023 1029    BMET    Component Value Date/Time   NA 136 02/28/2023 1029   K 4.5 02/28/2023 1029   CL 95 (L) 02/28/2023 1029   CO2 23 02/28/2023 1029   GLUCOSE 100 (H) 02/28/2023 1029   BUN 34 (H) 02/28/2023 1029   CREATININE 8.55 (H) 02/28/2023 1029   CREATININE 5.93 (H) 06/29/2019 1210   CALCIUM 9.5 02/28/2023 1029   GFRNONAA 5 (L) 02/28/2023 1029   GFRNONAA 8 (L) 06/29/2019 1210   GFRAA 4 (L) 08/04/2020 0452   GFRAA 9 (L) 06/29/2019 1210    COAGS: Lab Results  Component Value Date   INR 1.2 06/26/2021   INR 1.1 01/12/2020   INR 1.03 12/22/2018     ASSESSMENT/PLAN: This is a 52 y.o. female with end-stage renal disease, currently oxygen dependent after her bout with COVID, who presents with left arm hematoma due to infiltration at dialysis.  There is a very large hematoma present.  With the paresthesias in the fingers, shared would benefit from hematoma evacuation, washout, fistula revision.  She will also need a tunneled dialysis catheter for dialysis access while the area heals.  After discussing the risk and benefits of left arm hematoma washout possible fistula revision tunneled dialysis catheter placement, she elected to proceed.  Fara Olden MD MS [facility] [phone] 05/26/2023  9:33 AM

## 2023-05-26 NOTE — Transfer of Care (Signed)
Immediate Anesthesia Transfer of Care Note  Patient: Patricia [name]  Procedure(s) Performed: REVISON OF ARTERIOVENOUS FISTULA LEFT ARM HEMATOMA WASH OUT (Left: Arm Upper) INSERTION OF Left internal jugular DIALYSIS CATHETER (Neck) APPLICATION OF WOUND VAC (Left: Arm Upper)  Patient Location: PACU  Anesthesia Type:General  Level of Consciousness: drowsy and pateint uncooperative  Airway & Oxygen Therapy: Patient Spontanous Breathing and Patient connected to nasal cannula oxygen  Post-op Assessment: Report given to RN and Post -op Vital signs reviewed and stable  Post vital signs: Reviewed and stable  Last Vitals:  Vitals Value Taken Time  BP    Temp    Pulse 81 05/26/23 1252  Resp 19 05/26/23 1252  SpO2 99 % 05/26/23 1252  Vitals shown include unvalidated device data.  Last Pain:  Vitals:   05/26/23 0804  TempSrc:   PainSc: 10-Worst pain ever         Complications: No notable events documented.

## 2023-05-27 ENCOUNTER — Encounter (HOSPITAL_COMMUNITY): Payer: Self-pay | Admitting: Vascular Surgery

## 2023-05-27 DIAGNOSIS — I120 Hypertensive chronic kidney disease with stage 5 chronic kidney disease or end stage renal disease: Secondary | ICD-10-CM | POA: Diagnosis not present

## 2023-05-27 DIAGNOSIS — Z992 Dependence on renal dialysis: Secondary | ICD-10-CM | POA: Diagnosis not present

## 2023-05-27 DIAGNOSIS — N250 Renal osteodystrophy: Secondary | ICD-10-CM | POA: Diagnosis not present

## 2023-05-27 DIAGNOSIS — T829XXA Unspecified complication of cardiac and vascular prosthetic device, implant and graft, initial encounter: Secondary | ICD-10-CM | POA: Diagnosis not present

## 2023-05-27 DIAGNOSIS — D631 Anemia in chronic kidney disease: Secondary | ICD-10-CM | POA: Diagnosis not present

## 2023-05-27 DIAGNOSIS — J9611 Chronic respiratory failure with hypoxia: Secondary | ICD-10-CM | POA: Diagnosis not present

## 2023-05-27 DIAGNOSIS — N186 End stage renal disease: Secondary | ICD-10-CM | POA: Diagnosis not present

## 2023-05-27 LAB — RENAL FUNCTION PANEL
Albumin: 3.9 g/dL (ref 3.5–5.0)
Anion gap: 20 — ABNORMAL HIGH (ref 5–15)
BUN: 64 mg/dL — ABNORMAL HIGH (ref 6–20)
CO2: 21 mmol/L — ABNORMAL LOW (ref 22–32)
Calcium: 9.2 mg/dL (ref 8.9–10.3)
Chloride: 97 mmol/L — ABNORMAL LOW (ref 98–111)
Creatinine, Ser: 11.93 mg/dL — ABNORMAL HIGH (ref 0.44–1.00)
GFR, Estimated: 3 mL/min — ABNORMAL LOW (ref >60)
Glucose, Bld: 144 mg/dL — ABNORMAL HIGH (ref 70–99)
Phosphorus: 7.1 mg/dL — ABNORMAL HIGH (ref 2.5–4.6)
Potassium: 5.1 mmol/L (ref 3.5–5.1)
Sodium: 138 mmol/L (ref 135–145)

## 2023-05-27 LAB — CBC
HCT: 24.7 % — ABNORMAL LOW (ref 36.0–46.0)
Hemoglobin: 8 g/dL — ABNORMAL LOW (ref 12.0–15.0)
MCH: 29.6 pg (ref 26.0–34.0)
MCHC: 32.4 g/dL (ref 30.0–36.0)
MCV: 91.5 fL (ref 80.0–100.0)
Platelets: 192 10*3/uL (ref 150–400)
RBC: 2.7 MIL/uL — ABNORMAL LOW (ref 3.87–5.11)
RDW: 13.7 % (ref 11.5–15.5)
WBC: 13.7 10*3/uL — ABNORMAL HIGH (ref 4.0–10.5)
nRBC: 0 % (ref 0.0–0.2)

## 2023-05-27 LAB — GLUCOSE, CAPILLARY
Glucose-Capillary: 140 mg/dL — ABNORMAL HIGH (ref 70–99)
Glucose-Capillary: 157 mg/dL — ABNORMAL HIGH (ref 70–99)
Glucose-Capillary: 192 mg/dL — ABNORMAL HIGH (ref 70–99)
Glucose-Capillary: 190 mg/dL — ABNORMAL HIGH (ref 70–99)
Glucose-Capillary: 187 mg/dL — ABNORMAL HIGH (ref 70–99)

## 2023-05-27 LAB — HEPATITIS B SURFACE ANTIGEN: Hepatitis B Surface Ag: NONREACTIVE

## 2023-05-27 MED ORDER — CINACALCET HCL 30 MG PO TABS
30.0000 mg | ORAL_TABLET | Freq: Every day | ORAL | Status: DC
  Administered 2023-05-28: 30 mg via ORAL
  Filled 2023-05-27: qty 1

## 2023-05-27 MED ORDER — CHLORHEXIDINE GLUCONATE CLOTH 2 % EX PADS
6.0000 | MEDICATED_PAD | Freq: Every day | CUTANEOUS | Status: DC
  Administered 2023-05-27: 6 via TOPICAL

## 2023-05-27 MED ORDER — SODIUM CHLORIDE 0.9 % IV SOLN
12.5000 mg | Freq: Four times a day (QID) | INTRAVENOUS | Status: DC | PRN
  Filled 2023-05-27: qty 0.5

## 2023-05-27 MED ORDER — PANTOPRAZOLE SODIUM 40 MG PO TBEC
40.0000 mg | DELAYED_RELEASE_TABLET | Freq: Every day | ORAL | Status: DC
  Administered 2023-05-28: 40 mg via ORAL
  Filled 2023-05-27: qty 1

## 2023-05-27 MED ORDER — LINACLOTIDE 145 MCG PO CAPS
290.0000 ug | ORAL_CAPSULE | Freq: Every day | ORAL | Status: DC
  Administered 2023-05-27 – 2023-05-28 (×2): 290 ug via ORAL
  Filled 2023-05-27 (×2): qty 2

## 2023-05-27 NOTE — Progress Notes (Addendum)
[facility] [place]  Subjective  - Eating breakfast appears well   Objective (!) 131/56 86 98.1 F (36.7 C) (Oral) 18 100%  Intake/Output Summary (Last 24 hours) at 05/27/2023 0708 Last data filed at 05/27/2023 0600 Gross per 24 hour  Intake 940 ml  Output 315 ml  Net 625 ml    Left arm ace wrap in place, JP 15 cc OP Incision vac 0ml OP  Assessment/Planning: POD #1 Left arm hematoma evacuation, fistula revision with TDC placement I will leave dressing in place and change tomorrow Elevation and active range of motion are encouraged  HD days MWF Nephrology will need to be consult per primary team   Patricia [name] 05/27/2023 7:08 AM --  VASCULAR STAFF ADDENDUM: I have independently interviewed and examined the patient. I agree with the above.  Drain pulled.  Fistula rest minimum 4 weeks. Will be cleared by our office before use. Home tomorrow pending normal dialysis run today.  Fara Olden, MD [facility] Phone Number: [phone] 05/27/2023 8:13 AM     Laboratory Lab Results: Recent Labs    05/26/23 0920 05/27/23 0401  WBC 12.4* 13.7*  HGB 9.6* 8.0*  HCT 31.1* 24.7*  PLT 222 192   BMET Recent Labs    05/26/23 0920 05/27/23 0401  NA 138 138  K 4.3 5.1  CL 100 97*  CO2 24 21*  GLUCOSE 193* 144*  BUN 47* 64*  CREATININE 9.40* 11.93*  CALCIUM 10.2 9.2    COAG Lab Results  Component Value Date   INR 1.2 06/26/2021   INR 1.1 01/12/2020   INR 1.03 12/22/2018   No results found for: "PTT"

## 2023-05-27 NOTE — TOC CM/SW Note (Signed)
Transition of Care [facility]) - Inpatient Brief Assessment   Patient Details  Name: Patricia [name] MRN: [id number] Date of Birth: 08/01/1971  Transition of Care [facility]) CM/SW Contact:    Tom-Johnson, Hershal Coria, RN Phone Number: 05/27/2023, 4:49 PM   Clinical Narrative:  CM spoke with patient at bedside about needs for post hospital transition. Went to [facility]  for worsening swelling at her LUA Fistula site in the L upper arm. Found to have Hematoma at site, transferred to [facility]. Underwent Hematoma evacuation yesterday 05/26/23 in the OR and placement of LT IJ TDC.   From home with son, has two supportive children. Mother and siblings also supportive. On disability. Independent with care and drives self to and from appointments. Patient goes to outpatient dialysis on a MWF schedule at [facility] clinic in [place].  Does not have DME's at home. PCP is Ladon Applebaum and uses [facility] on [address] in [place].   No TOC needs or recommendations noted at this time. CM will continue to follow as patient progresses with care towards discharge.        Transition of Care Asessment: Insurance and Status: Insurance coverage has been reviewed Patient has primary care physician: Yes Home environment has been reviewed: Yes Prior level of function:: Independent Prior/Current Home Services: No current home services Social Determinants of Health Reivew: SDOH reviewed no interventions necessary Readmission risk has been reviewed: Yes Transition of care needs: no transition of care needs at this time

## 2023-05-27 NOTE — Consult Note (Signed)
Renal Service Consult Note [facility]  Judaea Filter 05/27/2023 Maree Krabbe, MD Requesting Physician: Dr. Caleb Popp  Reason for Consult: ESRD pt w/ infiltrated AVF/ hematoma HPI: The patient is a 52 y.o. year-old w/ PMH as below who presented to ED 6/09 c/o worsening swelling at her fistula site in the L upper arm. She had eye surgery on Friday and missed HD that day so she did HD on Saturday instead. They had a hard time sticking her AVF and the treatment didn't got well (lots of alarming) but she did finish most of it. She went home and then yesterday am when she woke up her arm was more swollen so she came to the ED at [facility]. In the ED she was found to have a large hematoma so she was transferred to [facility] ED and seen by VVS Dr Karin Lieu.  She went to OR yesterday where they did hematoma evacuation, plication of a pseudoaneurysm at the lateral side of the AVF and placement of a new TDC in the LIJ position (RIJ was occluded). Pt was admitted and we are asked to see for dialysis.   Pt seen in her room. No c/o's today. She goes to [facility] , on HD x 8 yrs. Hx as above. Uses home O2 2L. Drives herself to dialysis. Lives w/ her son.  No abd or chest pain.   ROS - denies CP, no joint pain, no HA, no blurry vision, no rash, no diarrhea, no nausea/ vomiting, no dysuria, no difficulty voiding   Past Medical History  Past Medical History:  Diagnosis Date   Anemia    Ankle fracture    Arthritis    Asthma    Blood transfusion without reported diagnosis    Breast cancer (HCC)    Cancer (HCC)    COVID    Diabetes mellitus without complication (HCC)    Dialysis patient (HCC)    mon, wed, friday,    Dyspnea    End stage renal disease on dialysis (HCC)    M/W/F [facility] in [place]   GERD (gastroesophageal reflux disease)    Hypertension    OSA (obstructive sleep apnea)    uses CPAP sometimes   Pneumonia    PONV (postoperative nausea and vomiting)    Wears glasses     Past Surgical History  Past Surgical History:  Procedure Laterality Date   ABDOMINAL HYSTERECTOMY     APPLICATION OF WOUND VAC Left 05/26/2023   Procedure: APPLICATION OF WOUND VAC;  Surgeon: Victorino Sparrow, MD;  Location: [facility] OR;  Service: Vascular;  Laterality: Left;   AV FISTULA PLACEMENT  11/2014   at [facility] hospital   AV FISTULA REPAIR N/A 04/2021   BALLOON DILATION N/A 07/10/2016   Procedure: BALLOON DILATION;  Surgeon: West Bali, MD;  Location: [facility] END[facility] SUITE;  Service: Endoscopy;  Laterality: N/A;  Pyloric dilation   BREAST LUMPECTOMY     CESAREAN SECTION     CHOLECYSTECTOMY     COLONOSCOPY  02/2023   COLONOSCOPY WITH PROPOFOL N/A 09/27/2016   Dr. Jena Gauss: Internal hemorrhoids repeat colonoscopy in 10 years   COLONOSCOPY WITH PROPOFOL N/A 02/28/2023   Procedure: COLONOSCOPY WITH PROPOFOL;  Surgeon: Corbin Ade, MD;  Location: [facility] END[facility] SUITE;  Service: Endoscopy;  Laterality: N/A;  11:45 am, asa 3 dialysis pt   DILATION AND CURETTAGE OF UTERUS     ESOPHAGOGASTRODUODENOSCOPY N/A 07/10/2016   Dr.Fields- normal esophagus, gastric stenosis was found at the pylorus, gastritis  on bx, normal examined duodenun   ESOPHAGOGASTRODUODENOSCOPY (EGD) WITH PROPOFOL N/A 11/21/2022   Procedure: ESOPHAGOGASTRODUODENOSCOPY (EGD) WITH PROPOFOL;  Surgeon: Dolores Frame, MD;  Location: [facility];  Service: Gastroenterology;  Laterality: N/A;   EXTERNAL FIXATION REMOVAL Right 10/29/2018   Procedure: REMOVAL RIGHT ANKLE BIOMET ZIMMER EXTERNAL FIXATOR, SHORT LEG CAST APPLICATION;  Surgeon: Eldred Manges, MD;  Location: [facility] OR;  Service: Orthopedics;  Laterality: Right;   INSERTION OF DIALYSIS CATHETER  05/26/2023   Procedure: INSERTION OF Left internal jugular DIALYSIS CATHETER;  Surgeon: Victorino Sparrow, MD;  Location: [facility] OR;  Service: Vascular;;   MASTECTOMY     left sided   ORIF ANKLE FRACTURE Right 10/06/2018   Procedure: OPEN REDUCTION INTERNAL FIXATION (ORIF) RIGHT ANKLE  TRIMALLEOLAR;  Surgeon: Eldred Manges, MD;  Location: [facility] OR;  Service: Orthopedics;  Laterality: Right;   REVISON OF ARTERIOVENOUS FISTULA Left 05/26/2023   Procedure: REVISON OF ARTERIOVENOUS FISTULA LEFT ARM HEMATOMA WASH OUT;  Surgeon: Victorino Sparrow, MD;  Location: [facility] OR;  Service: Vascular;  Laterality: Left;   RIGHT/LEFT HEART CATH AND CORONARY ANGIOGRAPHY N/A 11/27/2022   Procedure: RIGHT/LEFT HEART CATH AND CORONARY ANGIOGRAPHY;  Surgeon: Swaziland, Peter M, MD;  Location: [facility] INVASIVE CV LAB;  Service: Cardiovascular;  Laterality: N/A;   Family History  Family History  Problem Relation Age of Onset   Diabetes Mellitus II Mother    Hypertension Mother    Heart block Mother    Hypertension Sister    Hypertension Sister    Colon cancer Neg Hx    Social History  reports that she has never smoked. She has never used smokeless tobacco. She reports that she does not drink alcohol and does not use drugs. Allergies  Allergies  Allergen Reactions   Amlodipine Besylate Rash and Other (See Comments)    dizziness   Reglan [Metoclopramide] Other (See Comments)    hallucinations    Home medications Prior to Admission medications   Medication Sig Start Date End Date Taking? Authorizing Provider  glipiZIDE (GLUCOTROL) 5 MG tablet TAKE 1 TABLET(5 MG) BY MOUTH TWICE DAILY BEFORE A MEAL 05/21/23  Yes Nida, Denman George, MD  insulin degludec (TRESIBA FLEXTOUCH) 200 UNIT/ML FlexTouch Pen Inject 20 Units into the skin at bedtime. 03/21/23  Yes Nida, Denman George, MD  labetalol (NORMODYNE) 100 MG tablet Take 100 mg by mouth 2 (two) times daily.   Yes [provider]  nitroGLYCERIN (NITROSTAT) 0.4 MG SL tablet Place 1 tablet (0.4 mg total) under the tongue every 5 (five) minutes as needed for chest pain. 11/28/22 11/28/23 Yes Rai, Ripudeep K, MD  acetaminophen (TYLENOL) 325 MG tablet Take 650 mg by mouth every 6 (six) hours as needed for mild pain or fever.    [provider]   albuterol (PROVENTIL) (2.5 MG/3ML) 0.083% nebulizer solution Take 3 mLs (2.5 mg total) by nebulization every 6 (six) hours as needed for wheezing or shortness of breath. 11/28/17   Erick Blinks, MD  albuterol (VENTOLIN HFA) 108 (90 Base) MCG/ACT inhaler Inhale 2 puffs into the lungs every 6 (six) hours as needed for wheezing or shortness of breath. 01/15/23   Parrett, Virgel Bouquet, NP  aspirin EC 81 MG tablet Take 1 tablet (81 mg total) by mouth daily. Swallow whole. 12/07/21   Pricilla Riffle, MD  Azelastine HCl (ASTEPRO) 0.15 % SOLN Place 1 spray into the nose at bedtime as needed (allergies). 04/16/23   Coralyn Helling, MD  cetirizine (ZYRTEC) 10  MG tablet Take 1 tablet (10 mg total) by mouth daily. 01/28/23   Coralyn Helling, MD  Cholecalciferol (VITAMIN D3) 125 MCG (5000 UT) CAPS TAKE 1 CAPSULE BY MOUTH EVERY DAY 09/13/22   Roma Kayser, MD  cinacalcet (SENSIPAR) 30 MG tablet Take 30 mg by mouth daily with breakfast.    [provider]  dicyclomine (BENTYL) 10 MG capsule Take 1 capsule (10 mg total) by mouth 2 (two) times daily as needed. Patient taking differently: Take 10 mg by mouth 2 (two) times daily as needed for spasms. 03/20/22   Gelene Mink, NP  docusate sodium (COLACE) 100 MG capsule Take 100 mg by mouth daily.    [provider]  ferric citrate (AURYXIA) 1 GM 210 MG(Fe) tablet Take 420 mg by mouth 2 (two) times daily with a meal. With Breakfast & with supper    [provider]  glipiZIDE (GLUCOTROL) 5 MG tablet Take 1 tablet (5 mg total) by mouth daily before breakfast. 05/21/23   Nida, Denman George, MD  ipratropium (ATROVENT) 0.03 % nasal spray Place 2 sprays into both nostrils 2 (two) times daily.  06/06/19   [provider]  irbesartan (AVAPRO) 75 MG tablet Take 75 mg by mouth daily. 01/18/23   [provider]  lanthanum (FOSRENOL) 500 MG chewable tablet Chew 1,000 mg by mouth 3 (three) times daily with meals.    [provider]   linaclotide (LINZESS) 290 MCG CAPS capsule Take 290 mcg by mouth daily before breakfast.    [provider]  montelukast (SINGULAIR) 10 MG tablet Take 1 tablet (10 mg total) by mouth at bedtime. 04/22/23   Coralyn Helling, MD  multivitamin (RENA-VIT) TABS tablet Take 1 tablet by mouth daily.    [provider]  NIFEdipine (ADALAT CC) 30 MG 24 hr tablet Take 30 mg by mouth at bedtime. 11/26/21   [provider]  ondansetron (ZOFRAN-ODT) 4 MG disintegrating tablet Take 1 tablet (4 mg total) by mouth every 8 (eight) hours as needed for nausea or vomiting. 02/19/23   Gelene Mink, NP  OXYGEN Inhale 2 L into the lungs as needed (exertion).    [provider]  pantoprazole (PROTONIX) 40 MG tablet TAKE 1 TABLET (40 MG TOTAL) BY MOUTH DAILY 30 MINUTES BEFORE BREAKFAST 01/10/23   Rourk, Gerrit Friends, MD  rOPINIRole (REQUIP XL) 2 MG 24 hr tablet Take 2 mg by mouth at bedtime.  09/10/16   [provider]  rosuvastatin (CRESTOR) 20 MG tablet Take 20 mg by mouth daily.    [provider]  sodium chloride (OCEAN) 0.65 % SOLN nasal spray Place 1 spray into both nostrils as needed for congestion. 01/02/20   Hongalgi, Maximino Greenland, MD  SYMBICORT 160-4.5 MCG/ACT inhaler Inhale 2 puffs into the lungs daily. 04/20/22   [provider]     Vitals:   05/26/23 1425 05/26/23 2026 05/27/23 0500 05/27/23 0840  BP: (!) 125/51 (!) 131/50 (!) 131/56 117/67  Pulse: 86 (!) 104 86 81  Resp: 18 18 18 16   Temp: 97.7 F (36.5 C) 98.7 F (37.1 C) 98.1 F (36.7 C) 97.8 F (36.6 C)  TempSrc: Oral Oral Oral Oral  SpO2: 97% 95% 100% 92%  Weight: 112.1 kg     Height: 5\' 3"  (1.6 m)      Exam Gen alert, no distress No rash, cyanosis or gangrene Sclera anicteric, throat clear  No jvd or bruits Chest clear bilat to bases, no rales/ wheezing RRR  no MRG Abd soft ntnd no mass or ascites +bs GU defer Ext no LE or UE edema L upper arm w/ wound VAC in place, AVF +bruit Neuro is  alert, Ox 3 , nf  LIJ TDC newly placed    Home meds include - sensipar 30 qd, auryxia 2 ac tid, glipizide, atrovent, protonix, home O2 2L, symbicort, asa, insulin degludec, irbesartan, labetalol, lanthanum 1 gm ac tid, singulair, renavite, nifedipine CC 30 qd, sl ntg prn, requip xl, crestor, prns/ vits/ supps    OP HD: MWF [facility]  4h 111kg  heparin 1000 bolus + 1800 u/hr drip   CXR 6/09 - IMPRESSION: Left midlung and left base opacities compatible with atelectasis or airspace disease.  Assessment/ Plan: AVF infiltration/ hematoma - sp evacuation by Dr Karin Lieu yesterday 6/09. Wound vac in place. New TDC placed for HD.  ESRD - on HD MWF. Last HD was Sat at OP unit. Plan HD today.  HTN/ volume - not grossly overloaded, BP's are good, cont home HTN meds. UF to 111kg w/ HD today.  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 Follow.  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 Cont binders and sensipar.  Chronic resp hypoxic failure - on home O2 2 L.  CXR w/o edema.  DM2 - on insulin   Vinson Moselle  MD [facility] 05/27/2023, 9:56 AM  Recent Labs  Lab 05/26/23 0920 05/27/23 0401  HGB 9.6* 8.0*  ALBUMIN  --  3.9  CALCIUM 10.2 9.2  PHOS  --  7.1*  CREATININE 9.40* 11.93*  K 4.3 5.1   Inpatient medications:  Chlorhexidine Gluconate Cloth  6 each Topical Daily   Chlorhexidine Gluconate Cloth  6 each Topical Q0600   heparin  5,000 Units Subcutaneous Q8H   insulin aspart  0-5 Units Subcutaneous QHS   insulin aspart  0-6 Units Subcutaneous TID WC   irbesartan  75 mg Oral Daily   labetalol  100 mg Oral BID   linaclotide  290 mcg Oral QAC breakfast   NIFEdipine  30 mg Oral QHS   rOPINIRole  1 mg Oral BID   rosuvastatin  20 mg Oral Daily    acetaminophen **OR** acetaminophen, albuterol, HYDROmorphone (DILAUDID) injection, ondansetron **OR** ondansetron (ZOFRAN) IV, mouth rinse, oxyCODONE

## 2023-05-27 NOTE — Hospital Course (Signed)
Patricia [name] is a 52 y.o. female with a history of hypertension, chronic acute failure, ESRD on HD, OSA on CPAP, hypertension, anemia, diabetes mellitus type 2.  Patient presented secondary to left arm swelling after fistula access for hemodialysis with developed hematoma.  Vascular surgery consulted and performed hematoma evacuation with revision of fistula.  Tunneled dialysis catheter placed to allow for continued hemodialysis. Wound vac discontinued prior to discharge.

## 2023-05-27 NOTE — Progress Notes (Signed)
   05/27/23 1745  Vitals  Temp 98.1 F (36.7 C)  Pulse Rate 85  Resp (!) 22  BP (!) 100/58  SpO2 100 %  Post Treatment  Dialyzer Clearance Heavily streaked  Duration of HD Treatment -hour(s) 3.5 hour(s)  Hemodialysis Intake (mL) 0 mL  Liters Processed 83.8  Fluid Removed (mL) 3000 mL  Tolerated HD Treatment Yes   Received patient in bed to unit.  Alert and oriented.  Informed consent signed and in chart.   TX duration:3.5hrs  Patient tolerated well.  Transported back to the room  Alert, without acute distress.  Hand-off given to patient's nurse.   Access used: [facility] Access issues: none  Total UF removed: 3L Medication(s) given: zofran   [facility]

## 2023-05-27 NOTE — Progress Notes (Signed)
PROGRESS NOTE    Patricia [name]  ZOX:[id number] DOB: 16-Mar-1971 DOA: 05/26/2023 PCP: [name], PA-C   Brief Narrative: Patricia [name] is a 52 y.o. female with a history of hypertension, chronic acute failure, ESRD on HD, OSA on CPAP, hypertension, anemia, diabetes mellitus type 2.  Patient presented secondary to left arm swelling after fistula access for hemodialysis with developed hematoma.  Vascular surgery consulted and performed hematoma evacuation with revision of fistula.  Tunneled dialysis catheter placed to allow for continued hemodialysis.    Assessment and Plan:  AV fistula complication Vascular surgery consulted and performed left arm hematoma evacuation with fistula revision on 6/9.  Wound VAC placed.  Tunneled dialysis catheter placed for continued hemodialysis.  ESRD on HD Nephrology on board. Hemodialysis per nephrology.  Chronic respiratory failure with hypoxia Stable. -Continue chronic 2 L/min supplemental oxygen  Primary hypertension -Continue irbesartan, labetalol, nifedipine  Nausea/vomiting -Zofran PRN -Phenergan PRN for breakthrough  Diabetes mellitus type 2 Well-controlled.  Recent hemoglobin A1c of 6.2%.  Patient has managed on glipizide and Tresiba 20 units nightly as an outpatient.  Patient started on sliding scale insulin on admission. -Continue sliding scale insulin  GERD -Continue Protonix  Anemia of chronic disease In setting of underlying chronic kidney disease/ESRD.  Hemoglobin stable.  Hyperlipidemia -Continue Crestor  History of cataracts Noted.  Morbid obesity Estimated body mass index is 44.83 kg/m as calculated from the following:   Height as of this encounter: 5\' 3"  (1.6 m).   Weight as of this encounter: 114.8 kg.   DVT prophylaxis: Heparin subq Code Status:   Code Status: Full Code Family Communication: Son at bedside Disposition Plan: Discharge home pending continued vascular surgery/nephrology  recommendations   Consultants:  Nephrology Vascular surgery  Procedures:  6/9: Left arm hematoma evacuation, fistula revision; Tunneled dialysis catheter-palindrome 23 cm left internal jugular vein  Antimicrobials: None    Subjective: Patient significantly nauseated today.  No other concerns.  Objective: BP (S) (!) 84/43 Comment: ufg < 3L temp < 35.5 for bp support pt stable no c/os  Pulse 94   Temp 97.9 F (36.6 C)   Resp (!) 25   Ht 5\' 3"  (1.6 m)   Wt 114.8 kg   SpO2 97%   BMI 44.83 kg/m   Examination:  General exam: Appears calm and comfortable Respiratory system: Clear to auscultation. Respiratory effort normal. Cardiovascular system: S1 & S2 heard, RRR. Gastrointestinal system: Abdomen is nondistended, soft and nontender. Normal bowel sounds heard. Central nervous system: Alert and oriented. Musculoskeletal: No calf tenderness.  Left arm with wound VAC in place. Skin: No cyanosis. No rashes Psychiatry: Judgement and insight appear normal. Mood & affect appropriate.    Data Reviewed: I have personally reviewed following labs and imaging studies  CBC Lab Results  Component Value Date   WBC 13.7 (H) 05/27/2023   RBC 2.70 (L) 05/27/2023   HGB 8.0 (L) 05/27/2023   HCT 24.7 (L) 05/27/2023   MCV 91.5 05/27/2023   MCH 29.6 05/27/2023   PLT 192 05/27/2023   MCHC 32.4 05/27/2023   RDW 13.7 05/27/2023   LYMPHSABS 1.3 02/28/2023   MONOABS 0.6 02/28/2023   EOSABS 0.1 02/28/2023   BASOSABS 0.1 02/28/2023     Last metabolic panel Lab Results  Component Value Date   NA 138 05/27/2023   K 5.1 05/27/2023   CL 97 (L) 05/27/2023   CO2 21 (L) 05/27/2023   BUN 64 (H) 05/27/2023   CREATININE 11.93 (H) 05/27/2023  GLUCOSE 144 (H) 05/27/2023   GFRNONAA 3 (L) 05/27/2023   GFRAA 4 (L) 08/04/2020   CALCIUM 9.2 05/27/2023   PHOS 7.1 (H) 05/27/2023   PROT 6.7 12/11/2022   ALBUMIN 3.9 05/27/2023   BILITOT 0.7 12/11/2022   ALKPHOS 103 12/11/2022   AST 24  12/11/2022   ALT 48 (H) 12/11/2022   ANIONGAP 20 (H) 05/27/2023    GFR: Estimated Creatinine Clearance: 6.7 mL/min (A) (by C-G formula based on SCr of 11.93 mg/dL (H)).  Recent Results (from the past 240 hour(s))  MRSA Next Gen by PCR, Nasal     Status: None   Collection Time: 05/26/23  3:09 PM   Specimen: Nasal Mucosa; Nasal Swab  Result Value Ref Range Status   MRSA by PCR Next Gen NOT DETECTED NOT DETECTED Final    Comment: (NOTE) The GeneXpert MRSA Assay (FDA approved for NASAL specimens only), is one component of a comprehensive MRSA colonization surveillance program. It is not intended to diagnose MRSA infection nor to guide or monitor treatment for MRSA infections. Test performance is not FDA approved in patients less than 40 years old. Performed at [facility], [address], [place], [place] [address]       Radiology Studies: DG C-Arm 1-60 Min  Result Date: 05/26/2023 CLINICAL DATA:  surgery, elective, dialysis catheter insertion EXAM: DG C-ARM 1-60 MIN COMPARISON:  11/18/2022 chest radiograph. FLUOROSCOPY TIME:  Radiation Exposure Index (if provided by the fluoroscopic device): 50.3 mGy FINDINGS: Spot fluoroscopic intraoperative chest radiograph demonstrates tip of superior approach left central venous catheter overlying the cavoatrial junction. IMPRESSION: Intraoperative fluoroscopic guidance for left central venous catheter placement. Electronically Signed   By: Delbert Phenix M.D.   On: 05/26/2023 14:50   DG CHEST PORT 1 VIEW  Result Date: 05/26/2023 CLINICAL DATA:  Status post dialysis catheter insertion. EXAM: PORTABLE CHEST 1 VIEW COMPARISON:  12/10/2022 FINDINGS: There is a left-sided dialysis catheter with tips at the cavoatrial junction. No signs of pneumothorax. Cardiac enlargement and low lung volumes. Mild left mid and left lower lung opacities are identified compatible with atelectasis versus airspace disease. IMPRESSION: 1. Satisfactory position of  left-sided dialysis catheter. No pneumothorax. 2. Left midlung and left base opacities compatible with atelectasis or airspace disease. Electronically Signed   By: Signa Kell M.D.   On: 05/26/2023 13:33      LOS: 1 day    Jacquelin Hawking, MD [facility] 05/27/2023, 4:54 PM   If 7PM-7AM, please contact night-coverage [email or web address]

## 2023-05-27 NOTE — Plan of Care (Signed)
  Problem: Education: Goal: Knowledge of General Education information will improve Description: Including pain rating scale, medication(s)/side effects and non-pharmacologic comfort measures Outcome: Completed/Met   

## 2023-05-27 NOTE — Plan of Care (Signed)
  Problem: Education: Goal: Knowledge of disease and its progression will improve Outcome: Completed/Met   

## 2023-05-28 ENCOUNTER — Encounter (HOSPITAL_COMMUNITY): Payer: Self-pay | Admitting: Ophthalmology

## 2023-05-28 ENCOUNTER — Encounter: Payer: Self-pay | Admitting: Internal Medicine

## 2023-05-28 DIAGNOSIS — T829XXA Unspecified complication of cardiac and vascular prosthetic device, implant and graft, initial encounter: Secondary | ICD-10-CM | POA: Diagnosis not present

## 2023-05-28 DIAGNOSIS — J9611 Chronic respiratory failure with hypoxia: Secondary | ICD-10-CM | POA: Diagnosis not present

## 2023-05-28 DIAGNOSIS — N186 End stage renal disease: Secondary | ICD-10-CM | POA: Diagnosis not present

## 2023-05-28 DIAGNOSIS — Z992 Dependence on renal dialysis: Secondary | ICD-10-CM | POA: Diagnosis not present

## 2023-05-28 DIAGNOSIS — D631 Anemia in chronic kidney disease: Secondary | ICD-10-CM | POA: Diagnosis not present

## 2023-05-28 DIAGNOSIS — N250 Renal osteodystrophy: Secondary | ICD-10-CM | POA: Diagnosis not present

## 2023-05-28 DIAGNOSIS — I120 Hypertensive chronic kidney disease with stage 5 chronic kidney disease or end stage renal disease: Secondary | ICD-10-CM | POA: Diagnosis not present

## 2023-05-28 LAB — RENAL FUNCTION PANEL
Albumin: 3.9 g/dL (ref 3.5–5.0)
Anion gap: 17 — ABNORMAL HIGH (ref 5–15)
BUN: 46 mg/dL — ABNORMAL HIGH (ref 6–20)
CO2: 26 mmol/L (ref 22–32)
Calcium: 9 mg/dL (ref 8.9–10.3)
Chloride: 91 mmol/L — ABNORMAL LOW (ref 98–111)
Creatinine, Ser: 8.86 mg/dL — ABNORMAL HIGH (ref 0.44–1.00)
GFR, Estimated: 5 mL/min — ABNORMAL LOW (ref >60)
Glucose, Bld: 225 mg/dL — ABNORMAL HIGH (ref 70–99)
Phosphorus: 6.8 mg/dL — ABNORMAL HIGH (ref 2.5–4.6)
Potassium: 4.4 mmol/L (ref 3.5–5.1)
Sodium: 134 mmol/L — ABNORMAL LOW (ref 135–145)

## 2023-05-28 LAB — HEPATITIS B SURFACE ANTIBODY, QUANTITATIVE: Hep B S AB Quant (Post): 64.6 m[IU]/mL (ref >9.9)

## 2023-05-28 LAB — GLUCOSE, CAPILLARY
Glucose-Capillary: 261 mg/dL — ABNORMAL HIGH (ref 70–99)
Glucose-Capillary: 177 mg/dL — ABNORMAL HIGH (ref 70–99)

## 2023-05-28 MED ORDER — CHLORHEXIDINE GLUCONATE CLOTH 2 % EX PADS: 6.0000 | MEDICATED_PAD | Freq: Every day | CUTANEOUS | Status: DC

## 2023-05-28 MED ORDER — OXYCODONE HCL 5 MG PO TABS: 5.0000 mg | ORAL_TABLET | Freq: Four times a day (QID) | ORAL | 0 refills | Status: AC | PRN

## 2023-05-28 NOTE — Progress Notes (Signed)
As per patient Vascular doctor Dr. Karin Lieu removed the wound vac.  Couldn't see the site as it was already dressed with guaze and tape.  Patient will be seeing the vascular doctor in 2 weeks.

## 2023-05-28 NOTE — Discharge Summary (Signed)
Physician Discharge Summary   Patient: Patricia [name] MRN: [id number] DOB: 03-16-1971  Admit date:     05/26/2023  Discharge date: 05/28/23  Discharge Physician: Jacquelin Hawking, MD   PCP: [name], PA-C   Recommendations at discharge:  PCP follow-up in 1 week Vascular surgery follow-up in 2 weeks Use tunneled dialysis catheter for hemodialysis; do not use fistula for at least 4 weeks until incision has healed and edema has dissipated per vascular surgery recommendations   Discharge Diagnoses: Principal Problem:   Complication of AV dialysis fistula Active Problems:   ESRD on dialysis [facility])   Chronic respiratory failure with hypoxia (HCC)   Essential hypertension   Anemia of chronic disease   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 *  Hospital Course: Patricia [name] is a 52 y.o. female with a history of hypertension, chronic acute failure, ESRD on HD, OSA on CPAP, hypertension, anemia, diabetes mellitus type 2.  Patient presented secondary to left arm swelling after fistula access for hemodialysis with developed hematoma.  Vascular surgery consulted and performed hematoma evacuation with revision of fistula.  Tunneled dialysis catheter placed to allow for continued hemodialysis. Wound vac discontinued prior to discharge.  Assessment and Plan:  AV fistula complication Vascular surgery consulted and performed left arm hematoma evacuation with fistula revision on 6/9.  Wound VAC placed.  Tunneled dialysis catheter placed for continued hemodialysis.   ESRD on HD Nephrology on board. Hemodialysis per nephrology.   Chronic respiratory failure with hypoxia Stable. -Continue chronic 2 L/min supplemental oxygen   Primary hypertension Continue irbesartan, labetalol, nifedipine   Nausea/vomiting Possibly related to IV narcotics.  Patient managed supportively with  antiemetics with resolution of symptoms.   Diabetes mellitus type 2 Well-controlled.  Recent hemoglobin A1c of 6.2%.  Patient has managed on glipizide and Tresiba 20 units nightly as an outpatient.  Patient started on sliding scale insulin on admission.  Continue outpatient regimen on discharge.   GERD Continue Protonix   Anemia of chronic disease In setting of underlying chronic kidney disease/ESRD.  Hemoglobin stable.   Hyperlipidemia Continue Crestor   History of cataracts Noted.   Morbid obesity Estimated body mass index is 44.83 kg/m as calculated from the following:   Height as of this encounter: 5\' 3"  (1.6 m).   Weight as of this encounter: 114.8 kg.  Consultants: Nephrology Vascular surgery  Procedures performed:  6/9: Left arm hematoma evacuation, fistula revision; Tunneled dialysis catheter-palindrome 23 cm left internal jugular vein  Disposition: Home Diet recommendation: Renal diet   DISCHARGE MEDICATION: Allergies as of 05/28/2023       Reactions   Amlodipine Besylate Rash, Other (See Comments)   dizziness   Reglan [metoclopramide] Other (See Comments)   hallucinations        Medication List     TAKE these medications    acetaminophen 325 MG tablet Commonly known as: TYLENOL Take 650 mg by mouth every 6 (six) hours as needed for mild pain or fever.   albuterol (2.5 MG/3ML) 0.083% nebulizer solution Commonly known as: PROVENTIL Take 3 mLs (2.5 mg total) by nebulization every 6 (six) hours as needed for wheezing or shortness of breath.   albuterol 108 (90 Base) MCG/ACT inhaler Commonly known as: Ventolin HFA Inhale 2 puffs into the lungs every 6 (six) hours as needed for wheezing or shortness of breath.   aspirin EC 81 MG tablet Take 1 tablet (  81 mg total) by mouth daily. Swallow whole.   Azelastine HCl 0.15 % Soln Commonly known as: Astepro Place 1 spray into the nose at bedtime as needed (allergies).   calcium acetate 667 MG  capsule Commonly known as: PHOSLO Take 1,334 mg by mouth 3 (three) times daily.   cetirizine 10 MG tablet Commonly known as: ZYRTEC Take 1 tablet (10 mg total) by mouth daily.   cinacalcet 30 MG tablet Commonly known as: SENSIPAR Take 30 mg by mouth daily with breakfast.   dicyclomine 10 MG capsule Commonly known as: BENTYL Take 1 capsule (10 mg total) by mouth 2 (two) times daily as needed. What changed: reasons to take this   docusate sodium 100 MG capsule Commonly known as: COLACE Take 100 mg by mouth daily.   ferric citrate 1 GM 210 MG(Fe) tablet Commonly known as: AURYXIA Take 420 mg by mouth 2 (two) times daily with a meal. With Breakfast & with supper   glipiZIDE 5 MG tablet Commonly known as: GLUCOTROL Take 1 tablet (5 mg total) by mouth daily before breakfast. What changed: Another medication with the same name was removed. Continue taking this medication, and follow the directions you see here.   ipratropium 0.03 % nasal spray Commonly known as: ATROVENT Place 2 sprays into both nostrils 2 (two) times daily.   irbesartan 75 MG tablet Commonly known as: AVAPRO Take 75 mg by mouth daily.   labetalol 100 MG tablet Commonly known as: NORMODYNE Take 100 mg by mouth 2 (two) times daily.   lanthanum 500 MG chewable tablet Commonly known as: FOSRENOL Chew 1,000 mg by mouth 3 (three) times daily with meals.   linaclotide 290 MCG Caps capsule Commonly known as: LINZESS Take 290 mcg by mouth daily before breakfast.   montelukast 10 MG tablet Commonly known as: SINGULAIR Take 1 tablet (10 mg total) by mouth at bedtime.   multivitamin Tabs tablet Take 1 tablet by mouth daily.   NIFEdipine 30 MG 24 hr tablet Commonly known as: ADALAT CC Take 30 mg by mouth at bedtime.   nitroGLYCERIN 0.4 MG SL tablet Commonly known as: Nitrostat Place 1 tablet (0.4 mg total) under the tongue every 5 (five) minutes as needed for chest pain.   ondansetron 4 MG disintegrating  tablet Commonly known as: ZOFRAN-ODT Take 1 tablet (4 mg total) by mouth every 8 (eight) hours as needed for nausea or vomiting.   oxyCODONE 5 MG immediate release tablet Commonly known as: Oxy IR/ROXICODONE Take 1 tablet (5 mg total) by mouth every 6 (six) hours as needed for up to 3 days for moderate pain.   OXYGEN Inhale 2 L into the lungs as needed (exertion).   pantoprazole 40 MG tablet Commonly known as: PROTONIX TAKE 1 TABLET (40 MG TOTAL) BY MOUTH DAILY 30 MINUTES BEFORE BREAKFAST   rOPINIRole 2 MG 24 hr tablet Commonly known as: REQUIP XL Take 2 mg by mouth at bedtime.   rosuvastatin 20 MG tablet Commonly known as: CRESTOR Take 20 mg by mouth daily.   sodium chloride 0.65 % Soln nasal spray Commonly known as: OCEAN Place 1 spray into both nostrils as needed for congestion.   Symbicort 160-4.5 MCG/ACT inhaler Generic drug: budesonide-formoterol Inhale 2 puffs into the lungs daily.   Evaristo Bury FlexTouch 200 UNIT/ML FlexTouch Pen Generic drug: insulin degludec Inject 20 Units into the skin at bedtime.   Vitamin D3 125 MCG (5000 UT) Caps TAKE 1 CAPSULE BY MOUTH EVERY DAY  Discharge Care Instructions  (From admission, onward)           Start     Ordered   05/28/23 0000  Discharge wound care:       Comments: Continue wound vac   05/28/23 1209            Follow-up Information     Victorino Sparrow, MD Follow up in 2 week(s).   Specialty: Vascular Surgery Why: Office will call you to arrange your appt (sent) Contact information: [address] [place] [address] [phone]         [name], PA-C. Schedule an appointment as soon as possible for a visit in 1 week(s).   Specialty: Family Medicine Why: For hospital follow-up Contact information: [address] [place] [address] [phone]                Discharge Exam: BP (!) 119/56 (BP Location: Right Arm)   Pulse 93   Temp 98.3 F (36.8  C)   Resp 18   Ht 5\' 3"  (1.6 m)   Wt 114.8 kg   SpO2 95%   BMI 44.83 kg/m   General exam: Appears calm and comfortable Respiratory system: Clear to auscultation. Respiratory effort normal. Cardiovascular system: S1 & S2 heard, RRR. Systolic murmur Gastrointestinal system: Abdomen is nondistended, soft and nontender.  Normal bowel sounds heard. Central nervous system: Alert and oriented. No focal neurological deficits. Psychiatry: Judgement and insight appear normal. Mood & affect appropriate.   Condition at discharge: stable  The results of significant diagnostics from this hospitalization (including imaging, microbiology, ancillary and laboratory) are listed below for reference.   Imaging Studies: DG C-Arm 1-60 Min  Result Date: 05/26/2023 CLINICAL DATA:  surgery, elective, dialysis catheter insertion EXAM: DG C-ARM 1-60 MIN COMPARISON:  11/18/2022 chest radiograph. FLUOROSCOPY TIME:  Radiation Exposure Index (if provided by the fluoroscopic device): 50.3 mGy FINDINGS: Spot fluoroscopic intraoperative chest radiograph demonstrates tip of superior approach left central venous catheter overlying the cavoatrial junction. IMPRESSION: Intraoperative fluoroscopic guidance for left central venous catheter placement. Electronically Signed   By: Delbert Phenix M.D.   On: 05/26/2023 14:50   DG CHEST PORT 1 VIEW  Result Date: 05/26/2023 CLINICAL DATA:  Status post dialysis catheter insertion. EXAM: PORTABLE CHEST 1 VIEW COMPARISON:  12/10/2022 FINDINGS: There is a left-sided dialysis catheter with tips at the cavoatrial junction. No signs of pneumothorax. Cardiac enlargement and low lung volumes. Mild left mid and left lower lung opacities are identified compatible with atelectasis versus airspace disease. IMPRESSION: 1. Satisfactory position of left-sided dialysis catheter. No pneumothorax. 2. Left midlung and left base opacities compatible with atelectasis or airspace disease. Electronically Signed    By: Signa Kell M.D.   On: 05/26/2023 13:33   US THYROID  Result Date: 05/21/2023 CLINICAL DATA:  52 year old female with a history of thyroid goiter EXAM: THYROID ULTRASOUND TECHNIQUE: Ultrasound examination of the thyroid gland and adjacent soft tissues was performed. COMPARISON:  01/04/2022 Biopsy of right inferior thyroid nodule 02/27/2022 FINDINGS: Parenchymal Echotexture: Mildly heterogenous Isthmus: 1.7 cm Right lobe: 6.1 cm x 2.2 cm x 2.8 cm Left lobe: 5.3 cm x 3.0 cm x 2.4 cm _________________________________________________________ Estimated total number of nodules >/= 1 cm: 2 Number of spongiform nodules >/=  2 cm not described below (TR1): 0 Number of mixed cystic and solid nodules >/= 1.5 cm not described below (TR2): 0 _________________________________________________________ Nodule labeled 1 inferior right thyroid, 3.0 cm, decreased from 4.6 cm. Nodule has  undergone prior biopsy 01/04/2022. Assuming benign result, no further specific follow-up would be indicated. Nodule labeled 2, inferior right thyroid, 1.0 cm, TR 4. Nodule is considered low risk but meets criteria for surveillance. No adenopathy. Recommendations follow those established by the new ACR TI-RADS criteria (J Am Coll Radiol 2017;14:587-595). IMPRESSION: Redemonstration of heterogeneous enlarged thyroid, compatible with goiter. Rim calcified nodule labeled 2 in the inferior right thyroid nodule (1.1 cm, TR 4) meets criteria for surveillance, as designated by the newly established ACR TI-RADS criteria. Surveillance ultrasound study recommended to be performed annually up to 5 years. Recommendations follow those established by the new ACR TI-RADS criteria (J Am Coll Radiol 2017;14:587-595). Electronically Signed   By: Gilmer Mor D.O.   On: 05/21/2023 11:58   Panretinal Photocoagulation - OS - Left Eye  Result Date: 05/07/2023 LASER PROCEDURE NOTE Diagnosis:   Proliferative Diabetic Retinopathy, LEFT EYE Procedure:  Pan-retinal  photocoagulation using slit lamp laser, LEFT EYE, fill-in Anesthesia:  Topical Surgeon: Rennis Chris, MD, PhD Informed consent obtained, operative eye marked, and time out performed prior to initiation of laser. Lumenis [id number] slit lamp laser Pattern: 3x3 square Power: 300 mW Duration: 30 msec Spot size: 200 microns # spots: 1540 spots 360 fill-in Complications: None. RTC: wk of June 17th -- DFE/OCT, possible injxns Patient tolerated the procedure well and received written and verbal post-procedure care information/education.   OCT, Retina - OU - Both Eyes  Result Date: 05/07/2023 Right Eye Quality was good. Central Foveal Thickness: 265. Progression has improved. Findings include normal foveal contour, no SRF, intraretinal hyper-reflective material, intraretinal fluid (Focal IRHM and trace cystic changes temporal fovea -- slightly improved; IT schisis - stable). Left Eye Quality was good. Central Foveal Thickness: 439. Progression has been stable. Findings include no SRF, abnormal foveal contour, intraretinal hyper-reflective material, intraretinal fluid, vitreomacular adhesion (persistent IRF/IRHM/edema temporal fovea; IN schisis caught on widefield). Notes *Images captured and stored on drive Diagnosis / Impression: +DME OU + peripheral retinoschisis OU OD: Focal IRHM and trace cystic changes temporal fovea -- slightly improved; IT schisis - stable OS: persistent IRF/IRHM/edema temporal fovea; IN schisis caught on widefield Clinical management: See below Abbreviations: NFP - Normal foveal profile. CME - cystoid macular edema. PED - pigment epithelial detachment. IRF - intraretinal fluid. SRF - subretinal fluid. EZ - ellipsoid zone. ERM - epiretinal membrane. ORA - outer retinal atrophy. ORT - outer retinal tubulation. SRHM - subretinal hyper-reflective material. IRHM - intraretinal hyper-reflective material   Intravitreal Injection, Pharmacologic Agent - OD - Right Eye  Result Date: 05/02/2023 Time Out  05/02/2023. 9:27 AM. Confirmed correct patient, procedure, site, and patient consented. Anesthesia Topical anesthesia was used. Anesthetic medications included Lidocaine 4%, Proparacaine 0.5%. Procedure Preparation included 5% betadine to ocular surface, eyelid speculum. A (32g) needle was used. Injection: 1.25 mg Bevacizumab 1.25mg /0.47ml   Route: Intravitreal, Site: Right Eye   NDC: P3213405, Lot: [id number], Expiration date: 07/11/2023 Post-op Post injection exam found visual acuity of at least counting fingers. The patient tolerated the procedure well. There were no complications. The patient received written and verbal post procedure care education. Post injection medications were not given.   OCT, Retina - OU - Both Eyes  Result Date: 05/02/2023 Right Eye Quality was good. Central Foveal Thickness: 296. Progression has been stable. Findings include normal foveal contour, no SRF, intraretinal hyper-reflective material, intraretinal fluid (Focal IRHM and trace cystic changes temporal fovea, IT schisis). Left Eye Quality was good. Central Foveal Thickness: 436. Progression has improved. Findings include no  SRF, abnormal foveal contour, intraretinal hyper-reflective material, intraretinal fluid, vitreomacular adhesion (Interval improvement in IRF/IRHM/edema temporal fovea; IN schisis caught on widefield). Notes *Images captured and stored on drive Diagnosis / Impression: +DME OU + peripheral retinoschisis OU OD: Focal IRHM and trace cystic changes temporal fovea, IT schisis OS: Interval improvement in IRF/IRHM/edema temporal fovea; IN schisis caught on widefield Clinical management: See below Abbreviations: NFP - Normal foveal profile. CME - cystoid macular edema. PED - pigment epithelial detachment. IRF - intraretinal fluid. SRF - subretinal fluid. EZ - ellipsoid zone. ERM - epiretinal membrane. ORA - outer retinal atrophy. ORT - outer retinal tubulation. SRHM - subretinal hyper-reflective material. IRHM -  intraretinal hyper-reflective material   Intravitreal Injection, Pharmacologic Agent - OS - Left Eye  Result Date: 04/30/2023 Time Out 04/30/2023. 10:51 AM. Confirmed correct patient, procedure, site, and patient consented. Anesthesia Topical anesthesia was used. Anesthetic medications included Lidocaine 2%, Proparacaine 0.5%. Procedure Preparation included 5% betadine to ocular surface, eyelid speculum. A (32g) needle was used. Injection: 1.25 mg Bevacizumab 1.25mg /0.85ml   Route: Intravitreal, Site: Left Eye   NDC: P3213405, Lot: [id number], Expiration date: 07/11/2023 Post-op Post injection exam found visual acuity of at least counting fingers. The patient tolerated the procedure well. There were no complications. The patient received written and verbal post procedure care education.   Fluorescein Angiography Optos (Transit OS)  Result Date: 04/30/2023 Right Eye Early phase findings include blockage, microaneurysm, vascular perfusion defect. Mid/Late phase findings include blockage, leakage, microaneurysm, vascular perfusion defect (Clusters of perivascular leakage inferior and temporal midzone -- ?early NV). Left Eye Early phase findings include microaneurysm, vascular perfusion defect. Mid/Late phase findings include leakage, microaneurysm, retinal neovascularization, vascular perfusion defect (Clusters of NVE temporal midzone). Notes **Images stored on drive** Impression: PDR OU OD: Clusters of perivascular leakage inferior and temporal midzone -- ?early NV OS: Clusters of NVE temporal midzone   OCT, Retina - OU - Both Eyes  Result Date: 04/30/2023 Right Eye Quality was good. Central Foveal Thickness: 268. Progression has no prior data. Findings include normal foveal contour, no SRF, intraretinal hyper-reflective material, intraretinal fluid (Focal IRHM and trace cystic changes temporal fovea, IT schisis). Left Eye Quality was good. Central Foveal Thickness: 450. Progression has no prior data.  Findings include no SRF, abnormal foveal contour, intraretinal hyper-reflective material, intraretinal fluid, vitreomacular adhesion (+IRF/IRHM/edema temporal fovea, IN schisis caught on widefield). Notes *Images captured and stored on drive Diagnosis / Impression: +DME OU + peripheral retinoschisis OU OD: Focal IRHM and trace cystic changes temporal fovea, IT schisis OS: +IRF/IRHM/edema temporal fovea, IN schisis caught on widefield Clinical management: See below Abbreviations: NFP - Normal foveal profile. CME - cystoid macular edema. PED - pigment epithelial detachment. IRF - intraretinal fluid. SRF - subretinal fluid. EZ - ellipsoid zone. ERM - epiretinal membrane. ORA - outer retinal atrophy. ORT - outer retinal tubulation. SRHM - subretinal hyper-reflective material. IRHM - intraretinal hyper-reflective material    Microbiology: Results for orders placed or performed during the hospital encounter of 05/26/23  MRSA Next Gen by PCR, Nasal     Status: None   Collection Time: 05/26/23  3:09 PM   Specimen: Nasal Mucosa; Nasal Swab  Result Value Ref Range Status   MRSA by PCR Next Gen NOT DETECTED NOT DETECTED Final    Comment: (NOTE) The GeneXpert MRSA Assay (FDA approved for NASAL specimens only), is one component of a comprehensive MRSA colonization surveillance program. It is not intended to diagnose MRSA infection nor to guide or monitor treatment  for MRSA infections. Test performance is not FDA approved in patients less than 28 years old. Performed at [facility], [address] [place], [place] [address]     Labs: CBC: Recent Labs  Lab 05/26/23 0920 05/27/23 0401  WBC 12.4* 13.7*  HGB 9.6* 8.0*  HCT 31.1* 24.7*  MCV 90.7 91.5  PLT 222 192   Basic Metabolic Panel: Recent Labs  Lab 05/26/23 0920 05/27/23 0401 05/28/23 0406  NA 138 138 134*  K 4.3 5.1 4.4  CL 100 97* 91*  CO2 24 21* 26  GLUCOSE 193* 144* 225*  BUN 47* 64* 46*  CREATININE 9.40* 11.93* 8.86*   CALCIUM 10.2 9.2 9.0  PHOS  --  7.1* 6.8*   Liver Function Tests: Recent Labs  Lab 05/27/23 0401 05/28/23 0406  ALBUMIN 3.9 3.9   CBG: Recent Labs  Lab 05/27/23 1118 05/27/23 1524 05/27/23 1811 05/27/23 2124 05/28/23 0739  GLUCAP 157* 192* 190* 187* 261*    Discharge time spent: 35 minutes.  Signed: Jacquelin Hawking, MD [facility] 05/28/2023

## 2023-05-28 NOTE — Progress Notes (Signed)
Patricia [name] to be discharged Home per MD order. Discussed prescriptions and follow up appointments with the patient. Prescriptions and medication list explained in detail. Patient verbalized understanding.  Patient had portable oxygen tank from home and was on 2L.   Skin clean, dry and intact without evidence of skin break down, no evidence of skin tears noted. IV catheter discontinued intact. Site without signs and symptoms of complications. Dressing and pressure applied. Pt denies pain at the site currently. No complaints noted.  Patient free of lines, drains, and wounds.   An After Visit Summary (AVS) was printed and given to the patient. Patient escorted via wheelchair, and discharged home via private auto.  Arvilla Meres, RN

## 2023-05-28 NOTE — Progress Notes (Signed)
D/C order noted. Contacted [facility] and spoke to Miller, Charity fundraiser. Clinic advised that pt will d/c today and should resume care tomorrow. Vascular note from today, renal note from today, and d/c summary faxed to clinic for continuation of care.   Olivia Canter Renal Navigator [phone]

## 2023-05-28 NOTE — Progress Notes (Addendum)
[facility]  Subjective  - Doing well over all   Objective (!) 99/55 96 99.3 F (37.4 C) (Oral) 20 95%  Intake/Output Summary (Last 24 hours) at 05/28/2023 0730 Last data filed at 05/28/2023 0500 Gross per 24 hour  Intake 360 ml  Output 3000 ml  Net -2640 ml    Left UE incisional vac to suction, no drainage in vac Left UE palpable radial pulse, hand warm and well perfused Lungs non labored breathing  Assessment/Planning: POD # 2 Left arm hematoma evacuation, fistula revision with TDC placement   JP drain removed yesterday, no hematoma and compartments soft around the vac.   Vac will be maintained for 7-10 days until lights disappear then dressing can be removed.  F/U with our office will be arranged for 2 weeks for incision check.  TDC use for HD do not use fistula until incision has healed and edema has dissipated.  Fistula rest for at least 4 weeks.     Mosetta Pigeon 05/28/2023 7:30 AM --  VASCULAR STAFF ADDENDUM: I have independently interviewed and examined the patient. I agree with the above.  VAC leaking - removed at bedside. Dry bandage placed prior to discharge   Fara Olden, MD [facility] Phone Number: [phone] 05/28/2023 3:02 PM    Laboratory Lab Results: Recent Labs    05/26/23 0920 05/27/23 0401  WBC 12.4* 13.7*  HGB 9.6* 8.0*  HCT 31.1* 24.7*  PLT 222 192   BMET Recent Labs    05/27/23 0401 05/28/23 0406  NA 138 134*  K 5.1 4.4  CL 97* 91*  CO2 21* 26  GLUCOSE 144* 225*  BUN 64* 46*  CREATININE 11.93* 8.86*  CALCIUM 9.2 9.0    COAG Lab Results  Component Value Date   INR 1.2 06/26/2021   INR 1.1 01/12/2020   INR 1.03 12/22/2018   No results found for: "PTT"

## 2023-05-28 NOTE — Consult Note (Signed)
   [facility] CM Inpatient Consult   05/28/2023  Patricia [name] 1971/01/29 [id number]  [facility] [[facility]]  Accountable Care Organization [ACO] Patient:  Primary Care Provider:  [name], PA-C   Patient is currently active with [facility] [[facility]] Care Management for chronic disease management services.  Patient has been engaged by a [facility] RN CC.  Our community based plan of care has focused on disease management and community resource support for diabetes and ESRD.    Patient will receive a post hospital call and will be evaluated for assessments and disease process education.    Plan:  Patient for ongoing post hospital community care coordination support with [facility] RN CC, has an appointment on 05/30/23 came by to remind patient of appointment and patient has already transitioned,  Inpatient Transition Of Care [TOC] team member to make aware that [facility] Care Management following.   Of note, [facility] Care Management services does not replace or interfere with any services that are needed or arranged by inpatient [facility] care management team.   For additional questions or referrals please contact:  Charlesetta Shanks, RN BSN CCM [facility]  [phone] business mobile phone Toll free office [phone]  *Concierge Line  [phone] Fax number: [phone] [email or web address]

## 2023-05-28 NOTE — TOC Transition Note (Signed)
Transition of Care [facility]) - CM/SW Discharge Note   Patient Details  Name: Patricia [name] MRN: [id number] Date of Birth: 07-Nov-1971  Transition of Care [facility]) CM/SW Contact:  Tom-Johnson, Hershal Coria, RN Phone Number: 05/28/2023, 12:57 PM   Clinical Narrative:     Patient is scheduled for discharge today.  Readmission Risk Assessment done. Outpatient referral, hospital f/u and discharge instructions on AVS. No TOC needs or recommendations noted. Son, Jamar to transport at discharge.  No further TOC needs noted.       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 Additional resources added to the After Visit Summary for                  DME Arranged: N/A DME Agency: NA       HH Arranged: NA HH Agency: NA        Social Determinants of Health (SDOH) Interventions SDOH Screenings   Food Insecurity: No Food Insecurity (05/26/2023)  Housing: Low Risk  (05/26/2023)  Transportation Needs: No Transportation Needs (05/26/2023)  Utilities: Not At Risk (05/26/2023)  Financial Resource Strain: Low Risk  (04/18/2023)  Tobacco Use: Low Risk  (05/28/2023)     Readmission Risk Interventions    05/27/2023    4:48 PM 12/11/2022    1:40 PM  Readmission Risk Prevention Plan  Transportation Screening Complete Complete  Medication Review (RN Care Manager) Referral to Pharmacy Complete  PCP or Specialist appointment within 3-5 days of discharge Complete   HRI or Home Care Consult Complete Complete  SW Recovery Care/Counseling Consult Complete Complete  Palliative Care Screening Not Applicable Not Applicable  Skilled Nursing Facility Not Applicable  Not Applicable

## 2023-05-28 NOTE — Progress Notes (Signed)
[facility] Progress Note   Subjective:   Patient seen and examined at bedside.  Reports sore throat and itchiness around the bandages of the wound vac.  Otherwise doing ok.  States dialysis went ok yesterday just alarmed a lot.  Denies CP, SOB, abdominal pain and n/v/d.    Objective Vitals:   05/27/23 1811 05/27/23 2125 05/28/23 0523 05/28/23 0939  BP: (!) 110/47 (!) 107/57 (!) 99/55 (!) 119/56  Pulse: 88 (!) 102 96 93  Resp: 18 16 20 18   Temp: 98.9 F (37.2 C) 98.4 F (36.9 C) 99.3 F (37.4 C) 98.3 F (36.8 C)  TempSrc: Oral  Oral   SpO2: 97% 95% 95% 95%  Weight:      Height:       Physical Exam General:well appearing female in NAD Heart:RRR, no mrg Lungs:CTAB, nml WOB on RA Abdomen:soft, NTND Extremities: wound vac on LUE, +edema, no LE edema Dialysis Access: LU AVF w/wound vac in place,  L IJ [facility]   Filed Weights   05/26/23 1024 05/26/23 1425 05/27/23 1345  Weight: 110.2 kg 112.1 kg 114.8 kg    Intake/Output Summary (Last 24 hours) at 05/28/2023 1237 Last data filed at 05/28/2023 0800 Gross per 24 hour  Intake 420 ml  Output 3000 ml  Net -2580 ml    Additional Objective Labs: Basic Metabolic Panel: Recent Labs  Lab 05/26/23 0920 05/27/23 0401 05/28/23 0406  NA 138 138 134*  K 4.3 5.1 4.4  CL 100 97* 91*  CO2 24 21* 26  GLUCOSE 193* 144* 225*  BUN 47* 64* 46*  CREATININE 9.40* 11.93* 8.86*  CALCIUM 10.2 9.2 9.0  PHOS  --  7.1* 6.8*   Liver Function Tests: Recent Labs  Lab 05/27/23 0401 05/28/23 0406  ALBUMIN 3.9 3.9    CBC: Recent Labs  Lab 05/26/23 0920 05/27/23 0401  WBC 12.4* 13.7*  HGB 9.6* 8.0*  HCT 31.1* 24.7*  MCV 90.7 91.5  PLT 222 192   Recent Labs  Lab 05/27/23 1524 05/27/23 1811 05/27/23 2124 05/28/23 0739 05/28/23 1216  GLUCAP 192* 190* 187* 261* 177*    Studies/Results: DG CHEST PORT 1 VIEW  Result Date: 05/26/2023 CLINICAL DATA:  Status post dialysis catheter insertion. EXAM: PORTABLE CHEST 1  VIEW COMPARISON:  12/10/2022 FINDINGS: There is a left-sided dialysis catheter with tips at the cavoatrial junction. No signs of pneumothorax. Cardiac enlargement and low lung volumes. Mild left mid and left lower lung opacities are identified compatible with atelectasis versus airspace disease. IMPRESSION: 1. Satisfactory position of left-sided dialysis catheter. No pneumothorax. 2. Left midlung and left base opacities compatible with atelectasis or airspace disease. Electronically Signed   By: Signa Kell M.D.   On: 05/26/2023 13:33    Medications:  promethazine (PHENERGAN) injection (IM or IVPB)      Chlorhexidine Gluconate Cloth  6 each Topical Daily   Chlorhexidine Gluconate Cloth  6 each Topical Q0600   cinacalcet  30 mg Oral Q breakfast   heparin  5,000 Units Subcutaneous Q8H   insulin aspart  0-5 Units Subcutaneous QHS   insulin aspart  0-6 Units Subcutaneous TID WC   irbesartan  75 mg Oral Daily   labetalol  100 mg Oral BID   linaclotide  290 mcg Oral QAC breakfast   NIFEdipine  30 mg Oral QHS   pantoprazole  40 mg Oral Daily   rOPINIRole  1 mg Oral BID   rosuvastatin  20 mg Oral Daily    Dialysis Orders:  MWF [facility]  4h 111kg  heparin 1000 bolus + 1800 u/hr drip    CXR 6/09 - IMPRESSION: Left midlung and left base opacities compatible with atelectasis or airspace disease.   Assessment/ Plan: AVF infiltration/ hematoma - sp evacuation by Dr Karin Lieu 6/09. F/u Dr. Sherral Hammers in 2 weeks.  To rest for AVF for at least 4 weeks.  Wound vac in place. New TDC placed for HD.  ESRD - on HD MWF. HD yesterday per regular schedule.  HTN/ volume - not grossly overloaded, BP's are good, cont home HTN meds. UF to 111kg w/ HD today.  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 Follow.  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 Cont binders and sensipar.  Chronic resp hypoxic failure - on home O2 2 L.  CXR w/o edema.  DM2 - on insulin  Virgina Norfolk, PA-C [facility] 05/28/2023,12:37 PM  LOS: 2 days

## 2023-05-29 ENCOUNTER — Telehealth: Payer: Self-pay | Admitting: Physician Assistant

## 2023-05-29 DIAGNOSIS — D509 Iron deficiency anemia, unspecified: Secondary | ICD-10-CM | POA: Diagnosis not present

## 2023-05-29 DIAGNOSIS — N2581 Secondary hyperparathyroidism of renal origin: Secondary | ICD-10-CM | POA: Diagnosis not present

## 2023-05-29 DIAGNOSIS — D631 Anemia in chronic kidney disease: Secondary | ICD-10-CM | POA: Diagnosis not present

## 2023-05-29 DIAGNOSIS — N186 End stage renal disease: Secondary | ICD-10-CM | POA: Diagnosis not present

## 2023-05-29 DIAGNOSIS — Z992 Dependence on renal dialysis: Secondary | ICD-10-CM | POA: Diagnosis not present

## 2023-05-29 DIAGNOSIS — N250 Renal osteodystrophy: Secondary | ICD-10-CM | POA: Diagnosis not present

## 2023-05-29 NOTE — Telephone Encounter (Signed)
-----   Message from Lars Mage, [place] sent at 05/28/2023  7:36 AM EDT -----  Patricia [name] s/p left UE evacuation of hematoma f/u for incision check 2-3 weeks

## 2023-05-30 ENCOUNTER — Ambulatory Visit: Payer: Self-pay | Admitting: *Deleted

## 2023-05-30 ENCOUNTER — Encounter: Payer: Self-pay | Admitting: *Deleted

## 2023-05-30 NOTE — Patient Outreach (Signed)
  Care Coordination   Follow Up Visit Note   05/30/2023 Name: Patricia [name] MRN: [id number] DOB: [birth year]  Patricia [name] is a 52 y.o. year old female who sees Patricia [name], [place] for primary care. I spoke with  Patricia [name] by phone today.  What matters to the patients health and wellness today?  Managing HD access site s/p revision of arteriovenous fistula left arm due to hematoma, wound healing and infection prevention     Goals Addressed             This Visit's Progress    Manage ESRD (on dialysis)       Care Coordination Goals: Patient will keep dialysis appointments on M,W,F Patient will keep all medical appointments Has appointment with PCP tomorrow  Will call to schedule hosp f/u with vascular surgeon to check revision of arteriovenous fistula left arm due to hematoma. Temporary catheter in place for HD access. On left side as well.  Patient will reach out to provider with any new or worsening symptoms Patient will follow directions for dressing changes and will keep he area clean and dry Patient will avoid deodorant, soap, lotion, etc in the area of the dressing Patient will reach out to RN Care Coordinator with any resource or care coordination needs        SDOH assessments and interventions completed:  Yes  SDOH Interventions Today    Flowsheet Row Most Recent Value  SDOH Interventions   Transportation Interventions Intervention Not Indicated  Financial Strain Interventions Intervention Not Indicated        Care Coordination Interventions:  Yes, provided  Interventions Today    Flowsheet Row Most Recent Value  Chronic Disease   Chronic disease during today's visit Chronic Kidney Disease/End Stage Renal Disease (ESRD)  General Interventions   General Interventions Discussed/Reviewed General Interventions Discussed, General Interventions Reviewed, Labs, Doctor Visits  Labs Kidney Function  Doctor Visits Discussed/Reviewed Doctor Visits Discussed,  Doctor Visits Reviewed, Specialist, PCP  [recent hospitalization for REVISON OF ARTERIOVENOUS FISTULA LEFT ARM HEMATOMA WASH OUT. Has temporary catheter in place for HD access.]  PCP/Specialist Visits Compliance with follow-up visit  [appt with PCP tomorrow. Patient to return call to vascular suregon's office to schedule hospital f/u]  Education Interventions   Education Provided Provided Education  Provided Verbal Education On Mental Health/Coping with Illness, When to see the doctor, Medication, Other  [s/s of infection at incision site, dressing changes, keep area clean and dry, avoid lotions, deodorant, soaps, etc around the dressing site]  Pharmacy Interventions   Pharmacy Dicussed/Reviewed Pharmacy Topics Discussed, Pharmacy Topics Reviewed       Follow up plan: Follow up call scheduled for 06/13/23    Encounter Outcome:  Pt. Visit Completed   Demetrios Loll, BSN, RN-BC RN Care Coordinator [facility]  [facility] Direct Dial: [phone] Main #: [phone]

## 2023-05-31 DIAGNOSIS — N186 End stage renal disease: Secondary | ICD-10-CM | POA: Diagnosis not present

## 2023-05-31 DIAGNOSIS — D631 Anemia in chronic kidney disease: Secondary | ICD-10-CM | POA: Diagnosis not present

## 2023-05-31 DIAGNOSIS — D509 Iron deficiency anemia, unspecified: Secondary | ICD-10-CM | POA: Diagnosis not present

## 2023-05-31 DIAGNOSIS — Z6841 Body Mass Index (BMI) 40.0 and over, adult: Secondary | ICD-10-CM | POA: Diagnosis not present

## 2023-05-31 DIAGNOSIS — N250 Renal osteodystrophy: Secondary | ICD-10-CM | POA: Diagnosis not present

## 2023-05-31 DIAGNOSIS — Z992 Dependence on renal dialysis: Secondary | ICD-10-CM | POA: Diagnosis not present

## 2023-05-31 DIAGNOSIS — R0902 Hypoxemia: Secondary | ICD-10-CM | POA: Diagnosis not present

## 2023-05-31 DIAGNOSIS — N2581 Secondary hyperparathyroidism of renal origin: Secondary | ICD-10-CM | POA: Diagnosis not present

## 2023-05-31 DIAGNOSIS — L7632 Postprocedural hematoma of skin and subcutaneous tissue following other procedure: Secondary | ICD-10-CM | POA: Diagnosis not present

## 2023-06-03 ENCOUNTER — Emergency Department (HOSPITAL_COMMUNITY): Payer: Medicare Other

## 2023-06-03 ENCOUNTER — Other Ambulatory Visit: Payer: Self-pay

## 2023-06-03 ENCOUNTER — Observation Stay (HOSPITAL_COMMUNITY)
Admission: EM | Admit: 2023-06-03 | Discharge: 2023-06-04 | Disposition: A | Discharge status: Home / Self Care | Payer: Medicare Other | Attending: Internal Medicine | Admitting: Internal Medicine

## 2023-06-03 ENCOUNTER — Encounter (HOSPITAL_COMMUNITY): Payer: Self-pay

## 2023-06-03 DIAGNOSIS — N2581 Secondary hyperparathyroidism of renal origin: Secondary | ICD-10-CM | POA: Diagnosis not present

## 2023-06-03 DIAGNOSIS — I120 Hypertensive chronic kidney disease with stage 5 chronic kidney disease or end stage renal disease: Secondary | ICD-10-CM | POA: Diagnosis not present

## 2023-06-03 DIAGNOSIS — D631 Anemia in chronic kidney disease: Secondary | ICD-10-CM | POA: Diagnosis not present

## 2023-06-03 DIAGNOSIS — Z8616 Personal history of COVID-19: Secondary | ICD-10-CM | POA: Diagnosis not present

## 2023-06-03 DIAGNOSIS — D649 Anemia, unspecified: Secondary | ICD-10-CM | POA: Diagnosis not present

## 2023-06-03 DIAGNOSIS — R0602 Shortness of breath: Secondary | ICD-10-CM | POA: Insufficient documentation

## 2023-06-03 DIAGNOSIS — Z85828 Personal history of other malignant neoplasm of skin: Secondary | ICD-10-CM | POA: Insufficient documentation

## 2023-06-03 DIAGNOSIS — E1122 Type 2 diabetes mellitus with diabetic chronic kidney disease: Secondary | ICD-10-CM | POA: Insufficient documentation

## 2023-06-03 DIAGNOSIS — R5381 Other malaise: Secondary | ICD-10-CM | POA: Diagnosis not present

## 2023-06-03 DIAGNOSIS — R110 Nausea: Secondary | ICD-10-CM | POA: Diagnosis not present

## 2023-06-03 DIAGNOSIS — K219 Gastro-esophageal reflux disease without esophagitis: Secondary | ICD-10-CM | POA: Diagnosis present

## 2023-06-03 DIAGNOSIS — Z7982 Long term (current) use of aspirin: Secondary | ICD-10-CM | POA: Diagnosis not present

## 2023-06-03 DIAGNOSIS — Z992 Dependence on renal dialysis: Secondary | ICD-10-CM | POA: Diagnosis not present

## 2023-06-03 DIAGNOSIS — Z7984 Long term (current) use of oral hypoglycemic drugs: Secondary | ICD-10-CM | POA: Insufficient documentation

## 2023-06-03 DIAGNOSIS — J45909 Unspecified asthma, uncomplicated: Secondary | ICD-10-CM | POA: Diagnosis not present

## 2023-06-03 DIAGNOSIS — N186 End stage renal disease: Secondary | ICD-10-CM | POA: Diagnosis not present

## 2023-06-03 DIAGNOSIS — Z79899 Other long term (current) drug therapy: Secondary | ICD-10-CM | POA: Insufficient documentation

## 2023-06-03 DIAGNOSIS — I959 Hypotension, unspecified: Secondary | ICD-10-CM | POA: Diagnosis present

## 2023-06-03 DIAGNOSIS — D509 Iron deficiency anemia, unspecified: Secondary | ICD-10-CM | POA: Diagnosis not present

## 2023-06-03 DIAGNOSIS — I10 Essential (primary) hypertension: Secondary | ICD-10-CM | POA: Diagnosis not present

## 2023-06-03 DIAGNOSIS — N250 Renal osteodystrophy: Secondary | ICD-10-CM | POA: Diagnosis not present

## 2023-06-03 DIAGNOSIS — Z853 Personal history of malignant neoplasm of breast: Secondary | ICD-10-CM | POA: Diagnosis not present

## 2023-06-03 LAB — CBC WITH DIFFERENTIAL/PLATELET
Abs Immature Granulocytes: 0.08 10*3/uL — ABNORMAL HIGH (ref 0.00–0.07)
Basophils Absolute: 0 10*3/uL (ref 0.0–0.1)
Basophils Relative: 0 %
Eosinophils Absolute: 0.4 10*3/uL (ref 0.0–0.5)
Eosinophils Relative: 2 %
HCT: 21.7 % — ABNORMAL LOW (ref 36.0–46.0)
Hemoglobin: 6.8 g/dL — CL (ref 12.0–15.0)
Immature Granulocytes: 1 %
Lymphocytes Relative: 11 %
Lymphs Abs: 1.9 10*3/uL (ref 0.7–4.0)
MCH: 29.2 pg (ref 26.0–34.0)
MCHC: 31.3 g/dL (ref 30.0–36.0)
MCV: 93.1 fL (ref 80.0–100.0)
Monocytes Absolute: 1 10*3/uL (ref 0.1–1.0)
Monocytes Relative: 6 %
Neutro Abs: 13.4 10*3/uL — ABNORMAL HIGH (ref 1.7–7.7)
Neutrophils Relative %: 80 %
Platelets: 224 10*3/uL (ref 150–400)
RBC: 2.33 MIL/uL — ABNORMAL LOW (ref 3.87–5.11)
RDW: 13.8 % (ref 11.5–15.5)
WBC: 16.8 10*3/uL — ABNORMAL HIGH (ref 4.0–10.5)
nRBC: 0 % (ref 0.0–0.2)

## 2023-06-03 LAB — TYPE AND SCREEN: Unit division: 0

## 2023-06-03 LAB — COMPREHENSIVE METABOLIC PANEL
ALT: 8 U/L (ref 0–44)
AST: 16 U/L (ref 15–41)
Albumin: 4 g/dL (ref 3.5–5.0)
Alkaline Phosphatase: 64 U/L (ref 38–126)
Anion gap: 14 (ref 5–15)
BUN: 53 mg/dL — ABNORMAL HIGH (ref 6–20)
CO2: 24 mmol/L (ref 22–32)
Calcium: 9 mg/dL (ref 8.9–10.3)
Chloride: 100 mmol/L (ref 98–111)
Creatinine, Ser: 10.88 mg/dL — ABNORMAL HIGH (ref 0.44–1.00)
GFR, Estimated: 4 mL/min — ABNORMAL LOW (ref >60)
Glucose, Bld: 80 mg/dL (ref 70–99)
Potassium: 3.7 mmol/L (ref 3.5–5.1)
Sodium: 138 mmol/L (ref 135–145)
Total Bilirubin: 0.5 mg/dL (ref 0.3–1.2)
Total Protein: 7.2 g/dL (ref 6.5–8.1)

## 2023-06-03 LAB — BPAM RBC
Blood Product Expiration Date: 202407102359
Blood Product Expiration Date: 202407102359

## 2023-06-03 LAB — CBG MONITORING, ED: Glucose-Capillary: 82 mg/dL (ref 70–99)

## 2023-06-03 LAB — BRAIN NATRIURETIC PEPTIDE: B Natriuretic Peptide: 318 pg/mL — ABNORMAL HIGH (ref 0.0–100.0)

## 2023-06-03 LAB — PREPARE RBC (CROSSMATCH)

## 2023-06-03 LAB — GLUCOSE, CAPILLARY: Glucose-Capillary: 159 mg/dL — ABNORMAL HIGH (ref 70–99)

## 2023-06-03 LAB — TROPONIN I (HIGH SENSITIVITY): Troponin I (High Sensitivity): 20 ng/L — ABNORMAL HIGH (ref <18)

## 2023-06-03 MED ORDER — ACETAMINOPHEN 325 MG PO TABS
650.0000 mg | ORAL_TABLET | Freq: Four times a day (QID) | ORAL | Status: DC | PRN
  Administered 2023-06-03: 650 mg via ORAL
  Filled 2023-06-03: qty 2

## 2023-06-03 MED ORDER — HEPARIN SODIUM (PORCINE) 1000 UNIT/ML DIALYSIS: 1000.0000 [IU] | INTRAMUSCULAR | Status: DC | PRN

## 2023-06-03 MED ORDER — ONDANSETRON HCL 4 MG/2ML IJ SOLN
4.0000 mg | Freq: Four times a day (QID) | INTRAMUSCULAR | Status: DC | PRN
  Administered 2023-06-03 – 2023-06-04 (×2): 4 mg via INTRAVENOUS
  Filled 2023-06-03 (×2): qty 2

## 2023-06-03 MED ORDER — RENA-VITE PO TABS
1.0000 | ORAL_TABLET | Freq: Every day | ORAL | Status: DC
  Administered 2023-06-03 – 2023-06-04 (×2): 1 via ORAL
  Filled 2023-06-03 (×2): qty 1

## 2023-06-03 MED ORDER — DOCUSATE SODIUM 100 MG PO CAPS
100.0000 mg | ORAL_CAPSULE | Freq: Every day | ORAL | Status: DC
  Administered 2023-06-03 – 2023-06-04 (×2): 100 mg via ORAL
  Filled 2023-06-03 (×2): qty 1

## 2023-06-03 MED ORDER — DIPHENHYDRAMINE HCL 50 MG/ML IJ SOLN
12.5000 mg | Freq: Once | INTRAMUSCULAR | Status: AC
  Administered 2023-06-03: 12.5 mg via INTRAVENOUS
  Filled 2023-06-03: qty 1

## 2023-06-03 MED ORDER — OXYCODONE-ACETAMINOPHEN 5-325 MG PO TABS
1.0000 | ORAL_TABLET | Freq: Four times a day (QID) | ORAL | Status: DC | PRN
  Administered 2023-06-03: 1 via ORAL
  Filled 2023-06-03: qty 1

## 2023-06-03 MED ORDER — ASPIRIN 81 MG PO TBEC
81.0000 mg | DELAYED_RELEASE_TABLET | Freq: Every day | ORAL | Status: DC
  Administered 2023-06-03 – 2023-06-04 (×2): 81 mg via ORAL
  Filled 2023-06-03 (×2): qty 1

## 2023-06-03 MED ORDER — MONTELUKAST SODIUM 10 MG PO TABS
10.0000 mg | ORAL_TABLET | Freq: Every day | ORAL | Status: DC
  Administered 2023-06-03: 10 mg via ORAL
  Filled 2023-06-03: qty 1

## 2023-06-03 MED ORDER — CHLORHEXIDINE GLUCONATE CLOTH 2 % EX PADS
6.0000 | MEDICATED_PAD | Freq: Every day | CUTANEOUS | Status: DC
  Administered 2023-06-04: 6 via TOPICAL

## 2023-06-03 MED ORDER — ROSUVASTATIN CALCIUM 20 MG PO TABS
20.0000 mg | ORAL_TABLET | Freq: Every day | ORAL | Status: DC
  Administered 2023-06-03 – 2023-06-04 (×2): 20 mg via ORAL
  Filled 2023-06-03 (×2): qty 1

## 2023-06-03 MED ORDER — ACETAMINOPHEN 500 MG PO TABS
1000.0000 mg | ORAL_TABLET | Freq: Once | ORAL | Status: AC
  Administered 2023-06-03: 1000 mg via ORAL
  Filled 2023-06-03: qty 2

## 2023-06-03 MED ORDER — PENTAFLUOROPROP-TETRAFLUOROETH EX AERO: 1.0000 | INHALATION_SPRAY | CUTANEOUS | Status: DC | PRN

## 2023-06-03 MED ORDER — PANTOPRAZOLE SODIUM 40 MG PO TBEC
40.0000 mg | DELAYED_RELEASE_TABLET | Freq: Every day | ORAL | Status: DC
  Administered 2023-06-03 – 2023-06-04 (×2): 40 mg via ORAL
  Filled 2023-06-03 (×2): qty 1

## 2023-06-03 MED ORDER — ACETAMINOPHEN 650 MG RE SUPP: 650.0000 mg | Freq: Four times a day (QID) | RECTAL | Status: DC | PRN

## 2023-06-03 MED ORDER — INSULIN GLARGINE-YFGN 100 UNIT/ML ~~LOC~~ SOLN
20.0000 [IU] | Freq: Every day | SUBCUTANEOUS | Status: DC
  Administered 2023-06-03: 20 [IU] via SUBCUTANEOUS
  Filled 2023-06-03 (×2): qty 0.2

## 2023-06-03 MED ORDER — HEPARIN SODIUM (PORCINE) 1000 UNIT/ML DIALYSIS
1000.0000 [IU] | INTRAMUSCULAR | Status: DC | PRN
  Administered 2023-06-03: 3800 [IU]

## 2023-06-03 MED ORDER — ALTEPLASE 2 MG IJ SOLR: 2.0000 mg | Freq: Once | INTRAMUSCULAR | Status: DC | PRN

## 2023-06-03 MED ORDER — ORAL CARE MOUTH RINSE: 15.0000 mL | OROMUCOSAL | Status: DC | PRN

## 2023-06-03 MED ORDER — OXYCODONE-ACETAMINOPHEN 10-325 MG PO TABS: 1.0000 | ORAL_TABLET | Freq: Four times a day (QID) | ORAL | Status: DC | PRN

## 2023-06-03 MED ORDER — LORATADINE 10 MG PO TABS
10.0000 mg | ORAL_TABLET | Freq: Every day | ORAL | Status: DC
  Administered 2023-06-03 – 2023-06-04 (×2): 10 mg via ORAL
  Filled 2023-06-03 (×2): qty 1

## 2023-06-03 MED ORDER — LIDOCAINE-PRILOCAINE 2.5-2.5 % EX CREA: 1.0000 | TOPICAL_CREAM | CUTANEOUS | Status: DC | PRN

## 2023-06-03 MED ORDER — FERRIC CITRATE 1 GM 210 MG(FE) PO TABS
420.0000 mg | ORAL_TABLET | Freq: Two times a day (BID) | ORAL | Status: DC
  Administered 2023-06-03 – 2023-06-04 (×2): 420 mg via ORAL
  Filled 2023-06-03 (×4): qty 2

## 2023-06-03 MED ORDER — PROCHLORPERAZINE EDISYLATE 10 MG/2ML IJ SOLN
10.0000 mg | Freq: Once | INTRAMUSCULAR | Status: AC
  Administered 2023-06-03: 10 mg via INTRAVENOUS
  Filled 2023-06-03: qty 2

## 2023-06-03 MED ORDER — ROPINIROLE HCL ER 2 MG PO TB24: 2.0000 mg | ORAL_TABLET | Freq: Every day | ORAL | Status: DC

## 2023-06-03 MED ORDER — OXYCODONE HCL 5 MG PO TABS
5.0000 mg | ORAL_TABLET | Freq: Four times a day (QID) | ORAL | Status: DC | PRN
  Administered 2023-06-03: 5 mg via ORAL
  Filled 2023-06-03: qty 1

## 2023-06-03 MED ORDER — HEPARIN SODIUM (PORCINE) 1000 UNIT/ML IJ SOLN
INTRAMUSCULAR | Status: AC
  Filled 2023-06-03: qty 7

## 2023-06-03 MED ORDER — ROPINIROLE HCL 1 MG PO TABS
1.0000 mg | ORAL_TABLET | Freq: Two times a day (BID) | ORAL | Status: DC
  Administered 2023-06-03 – 2023-06-04 (×3): 1 mg via ORAL
  Filled 2023-06-03 (×3): qty 1

## 2023-06-03 MED ORDER — ONDANSETRON HCL 4 MG PO TABS: 4.0000 mg | ORAL_TABLET | Freq: Four times a day (QID) | ORAL | Status: DC | PRN

## 2023-06-03 MED ORDER — SODIUM CHLORIDE 0.9% IV SOLUTION: Freq: Once | INTRAVENOUS | Status: AC

## 2023-06-03 MED ORDER — HEPARIN SODIUM (PORCINE) 1000 UNIT/ML DIALYSIS
20.0000 [IU]/kg | INTRAMUSCULAR | Status: DC | PRN
  Administered 2023-06-03: 2300 [IU] via INTRAVENOUS_CENTRAL

## 2023-06-03 MED ORDER — LIDOCAINE HCL (PF) 1 % IJ SOLN: 5.0000 mL | INTRAMUSCULAR | Status: DC | PRN

## 2023-06-03 MED ORDER — ANTICOAGULANT SODIUM CITRATE 4% (200MG/5ML) IV SOLN: 5.0000 mL | Status: DC | PRN

## 2023-06-03 NOTE — ED Triage Notes (Signed)
Pt was brought by EMS who stated she was having issues with low blood pressure while receiving dialysis treatment. She did not complete the treatment. Pt feels she is in fluid overload and complains of malaise.

## 2023-06-03 NOTE — ED Provider Notes (Signed)
[place] EMERGENCY DEPARTMENT AT [facility] Provider Note   CSN: [id number] Arrival date & time: 06/03/23  2130     History  Chief Complaint  Patient presents with   Vascular Access Problem   hypervolemia    Patricia [name] is a 52 y.o. female.  HPI Patient presents for problems of dialysis.  Medical history includes DM, HTN, ESRD, GERD, CHF, HLD, anemia, asthma.  She was seen in the ED a week ago after hematoma developed proximal to her left arm AV fistula site.  She was admitted to [facility] and underwent evacuation of hematoma in addition to placement of a tunneled dialysis catheter in left upper chest.  She has since been receiving dialysis through this catheter.  On her dialysis sessions, she will frequently have hypotension.  Because of this, they have not been able to pull off fluid.  Today, she went to dialysis for approximately 40 minutes.  Dialysis session was discontinued due to her hypotension.  She has had nausea over the weekend which she states has improved.  She has had ongoing pain to area of left upper chest where they placed tunneled catheter.  She has not had any other recent symptoms.    Home Medications Prior to Admission medications   Medication Sig Start Date End Date Taking? Authorizing Provider  acetaminophen (TYLENOL) 325 MG tablet Take 650 mg by mouth every 6 (six) hours as needed for mild pain or fever.   Yes [provider]  albuterol (VENTOLIN HFA) 108 (90 Base) MCG/ACT inhaler Inhale 2 puffs into the lungs every 6 (six) hours as needed for wheezing or shortness of breath. 01/15/23  Yes Parrett, Tammy S, NP  aspirin EC 81 MG tablet Take 1 tablet (81 mg total) by mouth daily. Swallow whole. 12/07/21  Yes Pricilla Riffle, MD  cetirizine (ZYRTEC) 10 MG tablet Take 1 tablet (10 mg total) by mouth daily. 01/28/23  Yes Coralyn Helling, MD  docusate sodium (COLACE) 100 MG capsule Take 100 mg by mouth daily.   Yes [provider]  ferric  citrate (AURYXIA) 1 GM 210 MG(Fe) tablet Take 420 mg by mouth 2 (two) times daily with a meal. With Breakfast & with supper   Yes [provider]  glipiZIDE (GLUCOTROL) 5 MG tablet Take 1 tablet (5 mg total) by mouth daily before breakfast. 05/21/23  Yes Nida, Denman George, MD  insulin degludec (TRESIBA FLEXTOUCH) 200 UNIT/ML FlexTouch Pen Inject 20 Units into the skin at bedtime. 03/21/23  Yes Nida, Denman George, MD  irbesartan (AVAPRO) 75 MG tablet Take 75 mg by mouth daily. 01/18/23  Yes [provider]  labetalol (NORMODYNE) 100 MG tablet Take 100 mg by mouth 2 (two) times daily.   Yes [provider]  montelukast (SINGULAIR) 10 MG tablet Take 1 tablet (10 mg total) by mouth at bedtime. 04/22/23  Yes Coralyn Helling, MD  multivitamin (RENA-VIT) TABS tablet Take 1 tablet by mouth daily.   Yes [provider]  NIFEdipine (ADALAT CC) 30 MG 24 hr tablet Take 30 mg by mouth at bedtime. 11/26/21  Yes [provider]  nitroGLYCERIN (NITROSTAT) 0.4 MG SL tablet Place 1 tablet (0.4 mg total) under the tongue every 5 (five) minutes as needed for chest pain. 11/28/22 11/28/23 Yes Rai, Ripudeep K, MD  ondansetron (ZOFRAN-ODT) 4 MG disintegrating tablet Take 1 tablet (4 mg total) by mouth every 8 (eight) hours as needed for nausea or vomiting. 02/19/23  Yes Gelene Mink, NP  oxyCODONE-acetaminophen (PERCOCET) 10-325 MG tablet Take 1 tablet by mouth every 6 (six) hours. 05/31/23  Yes [provider]  pantoprazole (PROTONIX) 40 MG tablet TAKE 1 TABLET (40 MG TOTAL) BY MOUTH DAILY 30 MINUTES BEFORE BREAKFAST Patient taking differently: Take 40 mg by mouth daily. 01/10/23  Yes Rourk, Gerrit Friends, MD  rOPINIRole (REQUIP XL) 2 MG 24 hr tablet Take 2 mg by mouth at bedtime.  09/10/16  Yes [provider]  rosuvastatin (CRESTOR) 20 MG tablet Take 20 mg by mouth daily.   Yes [provider]  sodium chloride (OCEAN) 0.65 % SOLN nasal spray Place 1 spray into  both nostrils as needed for congestion. 01/02/20  Yes Hongalgi, Maximino Greenland, MD  OXYGEN Inhale 2 L into the lungs as needed (exertion).    [provider]      Allergies    Amlodipine besylate and Reglan [metoclopramide]    Review of Systems   Review of Systems  Constitutional:  Positive for fatigue.  Respiratory:  Positive for shortness of breath.   Cardiovascular:  Positive for chest pain.  Gastrointestinal:  Positive for nausea.  Neurological:  Positive for weakness (Generalized).  All other systems reviewed and are negative.   Physical Exam Updated Vital Signs BP (!) 170/89   Pulse 80   Temp 98.2 F (36.8 C) (Oral)   Resp 18   Ht 5\' 3"  (1.6 m)   Wt 114.8 kg   SpO2 91%   BMI 44.83 kg/m  Physical Exam Vitals and nursing note reviewed.  Constitutional:      General: She is not in acute distress.    Appearance: Normal appearance. She is well-developed. She is not ill-appearing, toxic-appearing or diaphoretic.  HENT:     Head: Normocephalic and atraumatic.     Right Ear: External ear normal.     Left Ear: External ear normal.     Nose: Nose normal.     Mouth/Throat:     Mouth: Mucous membranes are moist.  Eyes:     Extraocular Movements: Extraocular movements intact.     Conjunctiva/sclera: Conjunctivae normal.  Cardiovascular:     Rate and Rhythm: Normal rate and regular rhythm.     Heart sounds: No murmur heard. Pulmonary:     Effort: Pulmonary effort is normal. Tachypnea present. No respiratory distress.     Breath sounds: Rales present. No decreased breath sounds, wheezing or rhonchi.  Abdominal:     General: There is no distension.     Palpations: Abdomen is soft.     Tenderness: There is no abdominal tenderness.  Musculoskeletal:        General: No swelling. Normal range of motion.     Cervical back: Normal range of motion and neck supple.     Right lower leg: No edema.     Left lower leg: No edema.  Skin:    General: Skin is warm and dry.      Coloration: Skin is not jaundiced or pale.  Neurological:     General: No focal deficit present.     Mental Status: She is alert and oriented to person, place, and time.  Psychiatric:        Mood and Affect: Mood normal.        Behavior: Behavior normal.        Thought Content: Thought content normal.        Judgment: Judgment normal.     ED Results / Procedures / Treatments   Labs (all labs  ordered are listed, but only abnormal results are displayed) Labs Reviewed  COMPREHENSIVE METABOLIC PANEL - Abnormal; Notable for the following components:      Result Value   BUN 53 (*)    Creatinine, Ser 10.88 (*)    GFR, Estimated 4 (*)    All other components within normal limits  BRAIN NATRIURETIC PEPTIDE - Abnormal; Notable for the following components:   B Natriuretic Peptide 318.0 (*)    All other components within normal limits  CBC WITH DIFFERENTIAL/PLATELET - Abnormal; Notable for the following components:   WBC 16.8 (*)    RBC 2.33 (*)    Hemoglobin 6.8 (*)    HCT 21.7 (*)    Neutro Abs 13.4 (*)    Abs Immature Granulocytes 0.08 (*)    All other components within normal limits  TROPONIN I (HIGH SENSITIVITY) - Abnormal; Notable for the following components:   Troponin I (High Sensitivity) 20 (*)    All other components within normal limits  HEPATITIS B SURFACE ANTIGEN  HEPATITIS B SURFACE ANTIBODY, QUANTITATIVE  MAGNESIUM  BASIC METABOLIC PANEL  CBC  CBG MONITORING, ED  TYPE AND SCREEN  PREPARE RBC (CROSSMATCH)  PREPARE RBC (CROSSMATCH)    EKG EKG Interpretation  Date/Time:  Monday June 03 2023 07:09:12 EDT Ventricular Rate:  78 PR Interval:  172 QRS Duration: 110 QT Interval:  411 QTC Calculation: 469 R Axis:   76 Text Interpretation: Sinus rhythm Confirmed by Gloris Manchester [phone]) on 06/03/2023 8:56:44 AM  Radiology DG Chest Port 1 View  Result Date: 06/03/2023 CLINICAL DATA:  Missed dialysis.  Malaise. EXAM: PORTABLE CHEST 1 VIEW COMPARISON:  05/26/2023  FINDINGS: The cardio pericardial silhouette is enlarged. Vascular congestion evident without overt pulmonary edema. No focal airspace consolidation or substantial pleural effusion. Left IJ central line remains in place. Telemetry leads overlie the chest. IMPRESSION: Enlargement of the cardiopericardial silhouette with vascular congestion. Electronically Signed   By: Kennith Center M.D.   On: 06/03/2023 07:44    Procedures Procedures    Medications Ordered in ED Medications  Chlorhexidine Gluconate Cloth 2 % PADS 6 each (has no administration in time range)  pentafluoroprop-tetrafluoroeth (GEBAUERS) aerosol 1 Application (has no administration in time range)  lidocaine (PF) (XYLOCAINE) 1 % injection 5 mL (has no administration in time range)  lidocaine-prilocaine (EMLA) cream 1 Application (has no administration in time range)  heparin injection 1,000 Units (has no administration in time range)  anticoagulant sodium citrate solution 5 mL (has no administration in time range)  alteplase (CATHFLO ACTIVASE) injection 2 mg (has no administration in time range)  aspirin EC tablet 81 mg (81 mg Oral Given 06/03/23 1713)  rosuvastatin (CRESTOR) tablet 20 mg (20 mg Oral Given 06/03/23 1713)  insulin glargine-yfgn (SEMGLEE) injection 20 Units (has no administration in time range)  ferric citrate (AURYXIA) tablet 420 mg (420 mg Oral Given 06/03/23 1713)  docusate sodium (COLACE) capsule 100 mg (100 mg Oral Given 06/03/23 1713)  pantoprazole (PROTONIX) EC tablet 40 mg (40 mg Oral Given 06/03/23 1713)  multivitamin (RENA-VIT) tablet 1 tablet (1 tablet Oral Given 06/03/23 1713)  loratadine (CLARITIN) tablet 10 mg (10 mg Oral Given 06/03/23 1713)  montelukast (SINGULAIR) tablet 10 mg (has no administration in time range)  acetaminophen (TYLENOL) tablet 650 mg (650 mg Oral Given 06/03/23 1713)    Or  acetaminophen (TYLENOL) suppository 650 mg ( Rectal See Alternative 06/03/23 1713)  ondansetron (ZOFRAN) tablet 4 mg  (has no administration in time range)  Or  ondansetron (ZOFRAN) injection 4 mg (has no administration in time range)  oxyCODONE-acetaminophen (PERCOCET/ROXICET) 5-325 MG per tablet 1 tablet (has no administration in time range)    And  oxyCODONE (Oxy IR/ROXICODONE) immediate release tablet 5 mg (has no administration in time range)  rOPINIRole (REQUIP) tablet 1 mg (1 mg Oral Given 06/03/23 1246)  heparin injection 1,000 Units (3,800 Units Intracatheter Given 06/03/23 1551)  alteplase (CATHFLO ACTIVASE) injection 2 mg (has no administration in time range)  heparin injection 2,300 Units (2,300 Units Dialysis Given 06/03/23 1305)  Oral care mouth rinse (has no administration in time range)  prochlorperazine (COMPAZINE) injection 10 mg (10 mg Intravenous Given 06/03/23 1027)  diphenhydrAMINE (BENADRYL) injection 12.5 mg (12.5 mg Intravenous Given 06/03/23 1027)  acetaminophen (TYLENOL) tablet 1,000 mg (1,000 mg Oral Given 06/03/23 1038)  0.9 %  sodium chloride infusion (Manually program via Guardrails IV Fluids) (0 mLs Intravenous Stopped 06/03/23 1530)  0.9 %  sodium chloride infusion (Manually program via Guardrails IV Fluids) (0 mLs Intravenous Stopped 06/03/23 1647)    ED Course/ Medical Decision Making/ A&P                             Medical Decision Making Amount and/or Complexity of Data Reviewed Labs: ordered. Radiology: ordered.  Risk OTC drugs. Prescription drug management. Decision regarding hospitalization.   This patient presents to the ED for concern of shortness of breath, fatigue, nausea, this involves an extensive number of treatment options, and is a complaint that carries with it a high risk of complications and morbidity.  The differential diagnosis includes pulmonary edema, anemia, infection, metabolic derangements   Co morbidities that complicate the patient evaluation  DM, HTN, ESRD, GERD, CHF, HLD, anemia, asthma   Additional history obtained:  Additional  history obtained from N/A External records from outside source obtained and reviewed including EMR   Lab Tests:  I Ordered, and personally interpreted labs.  The pertinent results include: Acute on chronic anemia is present with hemoglobin of 6.8 today.  Creatinine and BUN are elevated consistent with ESRD.  Troponin is high-normal.  Electrolytes are normal.  BNP is moderately elevated.   Imaging Studies ordered:  I ordered imaging studies including chest x-ray I independently visualized and interpreted imaging which showed cardiomegaly with vascular congestion I agree with the radiologist interpretation   Cardiac Monitoring: / EKG:  The patient was maintained on a cardiac monitor.  I personally viewed and interpreted the cardiac monitored which showed an underlying rhythm of: Sinus rhythm   Consultations Obtained:  I requested consultation with the nephrologist, Dr. Kathrene Bongo,  and discussed lab and imaging findings as well as pertinent plan - they recommend: Will arrange for dialysis here at [facility].  Plan will be for blood transfusion while getting dialysis.   Problem List / ED Course / Critical interventions / Medication management  Patient presents for recent symptoms of fatigue, generalized weakness, shortness of breath, and nausea.  Recent history includes iatrogenic hematoma of the left fistula s/p hematoma evacuation and fistula revision 1 week ago.  Since that time, she has been undergoing dialysis through a tunneled catheter in her left upper chest.  Ultrafiltration during dialysis sessions has been limited due to her hypotension while undergoing dialysis.  This worsened today prompting them to discontinue dialysis after 40 minutes.  On arrival in the ED, patient has moderately elevated blood pressure.  She is well-appearing on exam.  Bandages in  place over her recent hematoma evacuation size of left bicep area.  Tunneled catheter is in place without any surrounding  erythema.  Workup was initiated.  Lab work was notable for hemoglobin of 6.8.  Patient's baseline is in the range of 8.  I suspect blood loss is secondary to her recent hematoma and procedures.  Patient was consented for blood transfusion.  Given her limited ultrafiltration during recent dialysis sessions, there is concern of worsening fluid overload with blood transfusion.  I spoke with nephrologist on-call, who states that they will be able to provide transfusion during dialysis session.  Nephrology to arrange for dialysis here.  While in the ED, patient endorsed headache and nausea.  Tylenol, Compazine, and Benadryl were ordered.  Patient to be admitted to medicine for observation and repeat H&H. I ordered medication including PVCs for symptomatic anemia; Tylenol, Benadryl, and Compazine for headache and nausea Reevaluation of the patient after these medicines showed that the patient improved I have reviewed the patients home medicines and have made adjustments as needed   Social Determinants of Health:  Has access to outpatient care  CRITICAL CARE Performed by: Gloris Manchester   Total critical care time: 32 minutes  Critical care time was exclusive of separately billable procedures and treating other patients.  Critical care was necessary to treat or prevent imminent or life-threatening deterioration.  Critical care was time spent personally by me on the following activities: development of treatment plan with patient and/or surrogate as well as nursing, discussions with consultants, evaluation of patient's response to treatment, examination of patient, obtaining history from patient or surrogate, ordering and performing treatments and interventions, ordering and review of laboratory studies, ordering and review of radiographic studies, pulse oximetry and re-evaluation of patient's condition.         Final Clinical Impression(s) / ED Diagnoses Final diagnoses:  Symptomatic anemia     Rx / DC Orders ED Discharge Orders     None         Gloris Manchester, MD 06/03/23 [phone]

## 2023-06-03 NOTE — Progress Notes (Signed)
   HEMODIALYSIS TREATMENT NOTE:  3 hour treatment completed using [facility] TDC.  2 units pRBCs transfused.  Pt had multiple complaints, could not get comfortable on stretcher, requested Ropinerole for restless legs, and ultimately asked to end session upon completion of blood transfusion.  45 minutes of treatment remained.  All blood was returned.  Post-HD:  06/03/23 1530  Vitals  Temp 98.1 F (36.7 C)  Temp Source Oral  BP (!) 163/70  MAP (mmHg) 91  BP Location Right Wrist  BP Method Automatic  Patient Position (if appropriate) Standing  Pulse Rate 75  Pulse Rate Source Monitor  ECG Heart Rate 76  Resp 18  Oxygen Therapy  SpO2 96 %  O2 Device Nasal Cannula  O2 Flow Rate (L/min) 2 L/min  Post Treatment  Dialyzer Clearance Lightly streaked  Duration of HD Treatment -hour(s) 3 hour(s) (Pt asked to end session early /AMA)  Hemodialysis Intake (mL) 100 mL  Liters Processed 61.4  Fluid Removed (mL) 2400 mL  Tolerated HD Treatment No (Comment) (signed off early / AMA with 45 min RTD)  Post-Hemodialysis Comments Signed off early / AMA with 45 minutes RTD.  Net UF 2.4 liters  Fistula / Graft Left Upper arm Arteriovenous fistula  No placement date or time found.   Placed prior to admission: Yes  Orientation: Left  Access Location: Upper arm  Access Type: Arteriovenous fistula  Fistula / Graft Assessment Thrill;Bruit  Status Patent  Hemodialysis Catheter Left Internal jugular Double lumen Permanent (Tunneled)  Placement Date/Time: 05/26/23 1114   Placed prior to admission: No  Serial / Lot #: [id number]  Expiration Date: 10/17/27  Time Out: Correct patient;Correct site;Correct procedure  Maximum sterile barrier precautions: Hand hygiene;Cap;Mask;Sterile gown...  Site Condition No complications  Blue Lumen Status Heparin locked;Dead end cap in place  Red Lumen Status Heparin locked;Dead end cap in place  Purple Lumen Status N/A  Catheter fill solution Heparin 1000 units/ml  Catheter fill  volume (Arterial) 1.9 cc  Catheter fill volume (Venous) 1.9  Dressing Type Transparent;Tube stabilization device  Dressing Status Clean, Dry, Intact  Interventions New dressing  Drainage Description None  Dressing Change Due 06/10/23  Post treatment catheter status Capped and Clamped    Report was called to Lacretia Leigh, RN.  Arman Filter, RN AP [facility]

## 2023-06-03 NOTE — Progress Notes (Signed)
Notified that this patient was sent to ER for low BP during HD today and has been an issue apparently-  interestingly BP here is high-  hgb is 6.6   Dialysis Orders: MWF [facility]  4h 111kg  heparin 1000 bolus + 1800 u/hr drip  Plan will be to do HD here today -  give blood -  2 units and UF as able-  dispo after HD will be determined based on clinical findings  Full consult note will happen tomorrow if stays and is inpatient status   Cecille Aver

## 2023-06-03 NOTE — ED Notes (Signed)
Patient blood administration to wait until HD today, per HD RN.

## 2023-06-03 NOTE — H&P (Signed)
History and Physical    Patricia [name] ZOX:[id number] DOB: [birth year] DOA: 06/03/2023  PCP: [name], PA-C   Patient coming from: Dialysis  Chief Complaint: Hypotension  HPI: Patricia [name] is a 52 y.o. female with medical history significant for history of hypertension, chronic acute failure, ESRD on HD, OSA on CPAP, hypertension, anemia, and diabetes mellitus type 2 who presented to the ED with some hypotension noted during dialysis today.  She underwent some ultrafiltration for approximately 40 minutes and then dialysis session was discontinued due to her hypotension.  She recently was admitted to [facility] and underwent evacuation of hematoma to her left arm AV fistula site in addition to placement of a tunneled dialysis catheter in her left upper chest.  She has had some nausea over the weekend as well as some pain to the area of her left upper chest.  She has some mild lightheadedness and dizziness.   ED Course: Vital signs stable and patient afebrile.  Hemoglobin 6.8 from baseline near 8.  Creatinine 10.88.  Nephrology plans for hemodialysis today and 2 unit PRBCs have been ordered.  Review of Systems: Reviewed as noted above, otherwise negative.  Past Medical History:  Diagnosis Date   Anemia    Ankle fracture    Arthritis    Asthma    Blood transfusion without reported diagnosis    Breast cancer (HCC)    Cancer (HCC)    COVID    Diabetes mellitus without complication (HCC)    Dyspnea    End stage renal disease on dialysis (HCC)    M/W/F [facility] in [place]   GERD (gastroesophageal reflux disease)    Hypertension    OSA (obstructive sleep apnea)    uses CPAP sometimes   Pneumonia    PONV (postoperative nausea and vomiting)    Wears glasses     Past Surgical History:  Procedure Laterality Date   ABDOMINAL HYSTERECTOMY     APPLICATION OF WOUND VAC Left 05/26/2023   Procedure: APPLICATION OF WOUND VAC;  Surgeon: Victorino Sparrow, MD;  Location: [facility] OR;  Service:  Vascular;  Laterality: Left;   AV FISTULA PLACEMENT  11/2014   at [facility] hospital   AV FISTULA REPAIR N/A 04/2021   BALLOON DILATION N/A 07/10/2016   Procedure: BALLOON DILATION;  Surgeon: West Bali, MD;  Location: [facility] ENDO SUITE;  Service: Endoscopy;  Laterality: N/A;  Pyloric dilation   BREAST LUMPECTOMY     CATARACT EXTRACTION W/PHACO Right 05/24/2023   Procedure: CATARACT EXTRACTION PHACO AND INTRAOCULAR LENS PLACEMENT (IOC);  Surgeon: [name], MD;  Location: [facility] ORS;  Service: Ophthalmology;  Laterality: Right;  CDE: 5.00   CESAREAN SECTION     CHOLECYSTECTOMY     COLONOSCOPY  02/2023   COLONOSCOPY WITH PROPOFOL N/A 09/27/2016   Dr. Jena Gauss: Internal hemorrhoids repeat colonoscopy in 10 years   COLONOSCOPY WITH PROPOFOL N/A 02/28/2023   Procedure: COLONOSCOPY WITH PROPOFOL;  Surgeon: Corbin Ade, MD;  Location: [facility];  Service: Endoscopy;  Laterality: N/A;  11:45 am, asa 3 dialysis pt   DILATION AND CURETTAGE OF UTERUS     ESOPHAGOGASTRODUODENOSCOPY N/A 07/10/2016   Dr.Fields- normal esophagus, gastric stenosis was found at the pylorus, gastritis on bx, normal examined duodenun   ESOPHAGOGASTRODUODENOSCOPY (EGD) WITH PROPOFOL N/A 11/21/2022   Procedure: ESOPHAGOGASTRODUODENOSCOPY (EGD) WITH PROPOFOL;  Surgeon: Dolores Frame, MD;  Location: [facility];  Service: Gastroenterology;  Laterality: N/A;   EXTERNAL FIXATION REMOVAL Right 10/29/2018   Procedure:  REMOVAL RIGHT ANKLE BIOMET ZIMMER EXTERNAL FIXATOR, SHORT LEG CAST APPLICATION;  Surgeon: Eldred Manges, MD;  Location: [facility] OR;  Service: Orthopedics;  Laterality: Right;   INSERTION OF DIALYSIS CATHETER  05/26/2023   Procedure: INSERTION OF Left internal jugular DIALYSIS CATHETER;  Surgeon: Victorino Sparrow, MD;  Location: [facility] OR;  Service: Vascular;;   MASTECTOMY     left sided   ORIF ANKLE FRACTURE Right 10/06/2018   Procedure: OPEN REDUCTION INTERNAL FIXATION (ORIF) RIGHT ANKLE TRIMALLEOLAR;  Surgeon:  Eldred Manges, MD;  Location: [facility] OR;  Service: Orthopedics;  Laterality: Right;   REVISON OF ARTERIOVENOUS FISTULA Left 05/26/2023   Procedure: REVISON OF ARTERIOVENOUS FISTULA LEFT ARM HEMATOMA WASH OUT;  Surgeon: Victorino Sparrow, MD;  Location: [facility] OR;  Service: Vascular;  Laterality: Left;   RIGHT/LEFT HEART CATH AND CORONARY ANGIOGRAPHY N/A 11/27/2022   Procedure: RIGHT/LEFT HEART CATH AND CORONARY ANGIOGRAPHY;  Surgeon: Swazil[place], Peter M, MD;  Location: [facility] INVASIVE CV LAB;  Service: Cardiovascular;  Laterality: N/A;     reports that she has never smoked. She has never used smokeless tobacco. She reports that she does not drink alcohol and does not use drugs.  Allergies  Allergen Reactions   Amlodipine Besylate Rash and Other (See Comments)    dizziness   Reglan [Metoclopramide] Other (See Comments)    hallucinations     Family History  Problem Relation Age of Onset   Diabetes Mellitus II Mother    Hypertension Mother    Heart block Mother    Hypertension Sister    Hypertension Sister    Colon cancer Neg Hx     Prior to Admission medications   Medication Sig Start Date End Date Taking? Authorizing Provider  acetaminophen (TYLENOL) 325 MG tablet Take 650 mg by mouth every 6 (six) hours as needed for mild pain or fever.   Yes [provider]  albuterol (VENTOLIN HFA) 108 (90 Base) MCG/ACT inhaler Inhale 2 puffs into the lungs every 6 (six) hours as needed for wheezing or shortness of breath. 01/15/23  Yes Parrett, Tammy S, NP  aspirin EC 81 MG tablet Take 1 tablet (81 mg total) by mouth daily. Swallow whole. 12/07/21  Yes Pricilla Riffle, MD  cetirizine (ZYRTEC) 10 MG tablet Take 1 tablet (10 mg total) by mouth daily. 01/28/23  Yes Coralyn Helling, MD  docusate sodium (COLACE) 100 MG capsule Take 100 mg by mouth daily.   Yes [provider]  ferric citrate (AURYXIA) 1 GM 210 MG(Fe) tablet Take 420 mg by mouth 2 (two) times daily with a meal. With Breakfast & with supper    Yes [provider]  glipiZIDE (GLUCOTROL) 5 MG tablet Take 1 tablet (5 mg total) by mouth daily before breakfast. 05/21/23  Yes Nida, Denman George, MD  insulin degludec (TRESIBA FLEXTOUCH) 200 UNIT/ML FlexTouch Pen Inject 20 Units into the skin at bedtime. 03/21/23  Yes Nida, Denman George, MD  irbesartan (AVAPRO) 75 MG tablet Take 75 mg by mouth daily. 01/18/23  Yes [provider]  labetalol (NORMODYNE) 100 MG tablet Take 100 mg by mouth 2 (two) times daily.   Yes [provider]  montelukast (SINGULAIR) 10 MG tablet Take 1 tablet (10 mg total) by mouth at bedtime. 04/22/23  Yes Coralyn Helling, MD  multivitamin (RENA-VIT) TABS tablet Take 1 tablet by mouth daily.   Yes [provider]  NIFEdipine (ADALAT CC) 30 MG 24 hr tablet Take 30 mg by mouth at bedtime. 11/26/21  Yes [provider]  nitroGLYCERIN (NITROSTAT) 0.4 MG SL tablet Place 1 tablet (0.4 mg total) under the tongue every 5 (five) minutes as needed for chest pain. 11/28/22 11/28/23 Yes Rai, Ripudeep K, MD  ondansetron (ZOFRAN-ODT) 4 MG disintegrating tablet Take 1 tablet (4 mg total) by mouth every 8 (eight) hours as needed for nausea or vomiting. 02/19/23  Yes Gelene Mink, NP  oxyCODONE-acetaminophen (PERCOCET) 10-325 MG tablet Take 1 tablet by mouth every 6 (six) hours. 05/31/23  Yes [provider]  pantoprazole (PROTONIX) 40 MG tablet TAKE 1 TABLET (40 MG TOTAL) BY MOUTH DAILY 30 MINUTES BEFORE BREAKFAST Patient taking differently: Take 40 mg by mouth daily. 01/10/23  Yes Rourk, Gerrit Friends, MD  rOPINIRole (REQUIP XL) 2 MG 24 hr tablet Take 2 mg by mouth at bedtime.  09/10/16  Yes [provider]  rosuvastatin (CRESTOR) 20 MG tablet Take 20 mg by mouth daily.   Yes [provider]  sodium chloride (OCEAN) 0.65 % SOLN nasal spray Place 1 spray into both nostrils as needed for congestion. 01/02/20  Yes Hongalgi, Maximino Greenland, MD  OXYGEN Inhale 2 L into the lungs as needed  (exertion).    [provider]    Physical Exam: Vitals:   06/03/23 1100 06/03/23 1115 06/03/23 1130 06/03/23 1140  BP: (!) 189/90 107/73 (!) 179/99   Pulse: 80 85 86   Resp: (!) 21 20 (!) 24   Temp:    98.2 F (36.8 C)  TempSrc:    Oral  SpO2: 98% 90% 94%     Constitutional: NAD, calm, comfortable, obese Vitals:   06/03/23 1100 06/03/23 1115 06/03/23 1130 06/03/23 1140  BP: (!) 189/90 107/73 (!) 179/99   Pulse: 80 85 86   Resp: (!) 21 20 (!) 24   Temp:    98.2 F (36.8 C)  TempSrc:    Oral  SpO2: 98% 90% 94%    Eyes: lids and conjunctivae normal Neck: normal, supple Respiratory: clear to auscultation bilaterally. Normal respiratory effort. No accessory muscle use.  Cardiovascular: Regular rate and rhythm, no murmurs. Abdomen: no tenderness, no distention. Bowel sounds positive.  Musculoskeletal:  No edema.  Dressing to left upper extremity intact Skin: no rashes, lesions, ulcers.  Psychiatric: Flat affect  Labs on Admission: I have personally reviewed following labs and imaging studies  CBC: Recent Labs  Lab 06/03/23 0858  WBC 16.8*  NEUTROABS 13.4*  HGB 6.8*  HCT 21.7*  MCV 93.1  PLT 224   Basic Metabolic Panel: Recent Labs  Lab 05/28/23 0406 06/03/23 0858  NA 134* 138  K 4.4 3.7  CL 91* 100  CO2 26 24  GLUCOSE 225* 80  BUN 46* 53*  CREATININE 8.86* 10.88*  CALCIUM 9.0 9.0  PHOS 6.8*  --    GFR: Estimated Creatinine Clearance: 7.3 mL/min (A) (by C-G formula based on SCr of 10.88 mg/dL (H)). Liver Function Tests: Recent Labs  Lab 05/28/23 0406 06/03/23 0858  AST  --  16  ALT  --  8  ALKPHOS  --  64  BILITOT  --  0.5  PROT  --  7.2  ALBUMIN 3.9 4.0   No results for input(s): "LIPASE", "AMYLASE" in the last 168 hours. No results for input(s): "AMMONIA" in the last 168 hours. Coagulation Profile: No results for input(s): "INR", "PROTIME" in the last 168 hours. Cardiac Enzymes: No results for input(s): "CKTOTAL", "CKMB",  "CKMBINDEX", "TROPONINI" in the last 168 hours. BNP (last 3 results) No  results for input(s): "PROBNP" in the last 8760 hours. HbA1C: No results for input(s): "HGBA1C" in the last 72 hours. CBG: Recent Labs  Lab 05/27/23 1524 05/27/23 1811 05/27/23 2124 05/28/23 0739 05/28/23 1216  GLUCAP 192* 190* 187* 261* 177*   Lipid Profile: No results for input(s): "CHOL", "HDL", "LDLCALC", "TRIG", "CHOLHDL", "LDLDIRECT" in the last 72 hours. Thyroid Function Tests: No results for input(s): "TSH", "T4TOTAL", "FREET4", "T3FREE", "THYROIDAB" in the last 72 hours. Anemia Panel: No results for input(s): "VITAMINB12", "FOLATE", "FERRITIN", "TIBC", "IRON", "RETICCTPCT" in the last 72 hours. Urine analysis:    Component Value Date/Time   COLORURINE STRAW (A) 12/22/2018 0847   APPEARANCEUR CLEAR 12/22/2018 0847   LABSPEC 1.007 12/22/2018 0847   PHURINE 9.0 (H) 12/22/2018 0847   GLUCOSEU >=500 (A) 12/22/2018 0847   HGBUR NEGATIVE 12/22/2018 0847   BILIRUBINUR NEGATIVE 12/22/2018 0847   KETONESUR NEGATIVE 12/22/2018 0847   PROTEINUR 100 (A) 12/22/2018 0847   NITRITE NEGATIVE 12/22/2018 0847   LEUKOCYTESUR NEGATIVE 12/22/2018 0847    Radiological Exams on Admission: DG Chest Port 1 View  Result Date: 06/03/2023 CLINICAL DATA:  Missed dialysis.  Malaise. EXAM: PORTABLE CHEST 1 VIEW COMPARISON:  05/26/2023 FINDINGS: The cardio pericardial silhouette is enlarged. Vascular congestion evident without overt pulmonary edema. No focal airspace consolidation or substantial pleural effusion. Left IJ central line remains in place. Telemetry leads overlie the chest. IMPRESSION: Enlargement of the cardiopericardial silhouette with vascular congestion. Electronically Signed   By: Kennith Center M.D.   On: 06/03/2023 07:44    EKG: Independently reviewed. SR 78.  Assessment/Plan Principal Problem:   Acute anemia Active Problems:   Essential hypertension   Diabetes mellitus with end-stage renal disease  (HCC)   GERD (gastroesophageal reflux disease)   Acute on chronic anemia   Morbid obesity (HCC)   ESRD needing dialysis (HCC)    Acute anemia with associated hypotension Likely related to AV fistula complication with need for revision during recent hospitalization Plan for 2 unit PRBC transfusion  AV fistula complication Vascular surgery consulted and performed left arm hematoma evacuation with fistula revision on 6/9.  Wound VAC placed.  Tunneled dialysis catheter placed for continued hemodialysis.   ESRD on HD Nephrology on board. Hemodialysis per nephrology today with plans for 2 unit PRBC transfusion   Chronic respiratory failure with hypoxia Stable. -Continue chronic 2 L/min supplemental oxygen   Primary hypertension Continue irbesartan, labetalol, nifedipine    Diabetes mellitus type 2 Well-controlled.  Recent hemoglobin A1c of 6.2%.  Patient has managed on glipizide and Tresiba 20 units nightly as an outpatient.  Patient started on sliding scale insulin on admission.    GERD Continue Protonix   Anemia of chronic disease In setting of underlying chronic kidney disease/ESRD.  Hemoglobin stable.   Hyperlipidemia Continue Crestor   History of cataracts Noted.   Morbid obesity BMI 44.83  DVT prophylaxis: SCDs Code Status: Full Family Communication: None at bedside Disposition Plan: Admit for 2 unit PRBC transfusion and dialysis Consults called: Nephrology Admission status: Observation, telemetry  Severity of Illness: The appropriate patient status for this patient is OBSERVATION. Observation status is judged to be reasonable and necessary in order to provide the required intensity of service to ensure the patient's safety. The patient's presenting symptoms, physical exam findings, and initial radiographic and laboratory data in the context of their medical condition is felt to place them at decreased risk for further clinical deterioration. Furthermore, it is  anticipated that the patient will be medically stable for  discharge from the hospital within 2 midnights of admission.    Porscha Axley D Sherryll Burger DO [facility]  If 7PM-7AM, please contact night-coverage [email or web address]  06/03/2023, 11:50 AM

## 2023-06-04 ENCOUNTER — Encounter (INDEPENDENT_AMBULATORY_CARE_PROVIDER_SITE_OTHER): Payer: Medicare Other | Admitting: Ophthalmology

## 2023-06-04 DIAGNOSIS — H33103 Unspecified retinoschisis, bilateral: Secondary | ICD-10-CM

## 2023-06-04 DIAGNOSIS — I10 Essential (primary) hypertension: Secondary | ICD-10-CM

## 2023-06-04 DIAGNOSIS — H35033 Hypertensive retinopathy, bilateral: Secondary | ICD-10-CM

## 2023-06-04 DIAGNOSIS — E113513 Type 2 diabetes mellitus with proliferative diabetic retinopathy with macular edema, bilateral: Secondary | ICD-10-CM

## 2023-06-04 DIAGNOSIS — Z794 Long term (current) use of insulin: Secondary | ICD-10-CM

## 2023-06-04 DIAGNOSIS — H25813 Combined forms of age-related cataract, bilateral: Secondary | ICD-10-CM

## 2023-06-04 DIAGNOSIS — D649 Anemia, unspecified: Secondary | ICD-10-CM | POA: Diagnosis not present

## 2023-06-04 LAB — CBC
HCT: 29 % — ABNORMAL LOW (ref 36.0–46.0)
Hemoglobin: 9.3 g/dL — ABNORMAL LOW (ref 12.0–15.0)
MCH: 29.1 pg (ref 26.0–34.0)
MCHC: 32.1 g/dL (ref 30.0–36.0)
MCV: 90.6 fL (ref 80.0–100.0)
Platelets: 250 10*3/uL (ref 150–400)
RBC: 3.2 MIL/uL — ABNORMAL LOW (ref 3.87–5.11)
RDW: 14.1 % (ref 11.5–15.5)
WBC: 15.1 10*3/uL — ABNORMAL HIGH (ref 4.0–10.5)
nRBC: 0 % (ref 0.0–0.2)

## 2023-06-04 LAB — HEPATITIS B SURFACE ANTIGEN: Hepatitis B Surface Ag: NONREACTIVE

## 2023-06-04 LAB — TYPE AND SCREEN
ABO/RH(D): O POS
Antibody Screen: NEGATIVE
Unit division: 0

## 2023-06-04 LAB — BPAM RBC
ISSUE DATE / TIME: 202406171156
ISSUE DATE / TIME: 202406171426
Unit Type and Rh: 5100
Unit Type and Rh: 5100

## 2023-06-04 LAB — BASIC METABOLIC PANEL
Anion gap: 15 (ref 5–15)
BUN: 36 mg/dL — ABNORMAL HIGH (ref 6–20)
CO2: 24 mmol/L (ref 22–32)
Calcium: 9.3 mg/dL (ref 8.9–10.3)
Chloride: 96 mmol/L — ABNORMAL LOW (ref 98–111)
Creatinine, Ser: 8.4 mg/dL — ABNORMAL HIGH (ref 0.44–1.00)
GFR, Estimated: 5 mL/min — ABNORMAL LOW (ref >60)
Glucose, Bld: 136 mg/dL — ABNORMAL HIGH (ref 70–99)
Potassium: 3.7 mmol/L (ref 3.5–5.1)
Sodium: 135 mmol/L (ref 135–145)

## 2023-06-04 LAB — GLUCOSE, CAPILLARY
Glucose-Capillary: 138 mg/dL — ABNORMAL HIGH (ref 70–99)
Glucose-Capillary: 157 mg/dL — ABNORMAL HIGH (ref 70–99)

## 2023-06-04 LAB — MAGNESIUM: Magnesium: 1.8 mg/dL (ref 1.7–2.4)

## 2023-06-04 MED ORDER — ONDANSETRON HCL 4 MG PO TABS: 4.0000 mg | ORAL_TABLET | Freq: Every day | ORAL | 1 refills | Status: DC | PRN

## 2023-06-04 MED ORDER — FERRIC CITRATE 1 GM 210 MG(FE) PO TABS
420.0000 mg | ORAL_TABLET | Freq: Three times a day (TID) | ORAL | Status: DC
  Administered 2023-06-04: 420 mg via ORAL
  Filled 2023-06-04 (×4): qty 2

## 2023-06-04 NOTE — Discharge Summary (Signed)
Physician Discharge Summary  [name] ZOX:[id number] DOB: October 08, 1971 DOA: 06/03/2023  PCP: [name], PA-C  Admit date: 06/03/2023  Discharge date: 06/04/2023  Admitted From:Home  Disposition:  Home  Recommendations for Outpatient Follow-up:  Follow up with PCP in 1-2 weeks Zofran provided as needed for nausea or vomiting Continue home medications as prior and continue routine hemodialysis  Home Health: None  Equipment/Devices: None  Discharge Condition:Stable  CODE STATUS: Full  Diet recommendation: Heart Healthy/carb modified  Brief/Interim Summary:  Patricia [name] is a 52 y.o. female with medical history significant for history of hypertension, chronic acute failure, ESRD on HD, OSA on CPAP, hypertension, anemia, and diabetes mellitus type 2 who presented to the ED with some hypotension noted during dialysis on the day of admission.  She was noted to have acute anemia associated with hypotension and required 2 unit PRBC transfusion with hemodialysis on 6/17.  Much of this blood loss was related to her recent left arm hematoma evacuation and fistula revision on 6/9.  No other overt bleeding identified.  She was noted to have some nausea and vomiting on the day of discharge, but had some Zofran and is now tolerating diet and stable for discharge.  Discharge Diagnoses:  Principal Problem:   Acute anemia Active Problems:   Essential hypertension   Diabetes mellitus with end-stage renal disease (HCC)   GERD (gastroesophageal reflux disease)   Acute on chronic anemia   Morbid obesity (HCC)   ESRD needing dialysis (HCC)  Principal discharge diagnosis: Acute anemia associated with hypotension status post 2 unit PRBC transfusion in the setting of recent blood loss from left arm hematoma.  Discharge Instructions  Discharge Instructions     Diet - low sodium heart healthy   Complete by: As directed    If the dressing is still on your incision site when you go home,  remove it on the third day after your surgery date. Remove dressing if it begins to fall off, or if it is dirty or damaged before the third day.   Complete by: As directed    Increase activity slowly   Complete by: As directed       Allergies as of 06/04/2023       Reactions   Amlodipine Besylate Rash, Other (See Comments)   dizziness   Reglan [metoclopramide] Other (See Comments)   hallucinations        Medication List     TAKE these medications    acetaminophen 325 MG tablet Commonly known as: TYLENOL Take 650 mg by mouth every 6 (six) hours as needed for mild pain or fever.   albuterol 108 (90 Base) MCG/ACT inhaler Commonly known as: Ventolin HFA Inhale 2 puffs into the lungs every 6 (six) hours as needed for wheezing or shortness of breath.   aspirin EC 81 MG tablet Take 1 tablet (81 mg total) by mouth daily. Swallow whole.   cetirizine 10 MG tablet Commonly known as: ZYRTEC Take 1 tablet (10 mg total) by mouth daily.   docusate sodium 100 MG capsule Commonly known as: COLACE Take 100 mg by mouth daily.   ferric citrate 1 GM 210 MG(Fe) tablet Commonly known as: AURYXIA Take 420 mg by mouth 2 (two) times daily with a meal. With Breakfast & with supper   glipiZIDE 5 MG tablet Commonly known as: GLUCOTROL Take 1 tablet (5 mg total) by mouth daily before breakfast.   irbesartan 75 MG tablet Commonly known as: AVAPRO Take 75 mg by mouth  daily.   labetalol 100 MG tablet Commonly known as: NORMODYNE Take 100 mg by mouth 2 (two) times daily.   montelukast 10 MG tablet Commonly known as: SINGULAIR Take 1 tablet (10 mg total) by mouth at bedtime.   multivitamin Tabs tablet Take 1 tablet by mouth daily.   NIFEdipine 30 MG 24 hr tablet Commonly known as: ADALAT CC Take 30 mg by mouth at bedtime.   nitroGLYCERIN 0.4 MG SL tablet Commonly known as: Nitrostat Place 1 tablet (0.4 mg total) under the tongue every 5 (five) minutes as needed for chest pain.    ondansetron 4 MG disintegrating tablet Commonly known as: ZOFRAN-ODT Take 1 tablet (4 mg total) by mouth every 8 (eight) hours as needed for nausea or vomiting.   ondansetron 4 MG tablet Commonly known as: Zofran Take 1 tablet (4 mg total) by mouth daily as needed for nausea or vomiting.   oxyCODONE-acetaminophen 10-325 MG tablet Commonly known as: PERCOCET Take 1 tablet by mouth every 6 (six) hours.   OXYGEN Inhale 2 L into the lungs as needed (exertion).   pantoprazole 40 MG tablet Commonly known as: PROTONIX TAKE 1 TABLET (40 MG TOTAL) BY MOUTH DAILY 30 MINUTES BEFORE BREAKFAST What changed: See the new instructions.   rOPINIRole 2 MG 24 hr tablet Commonly known as: REQUIP XL Take 2 mg by mouth at bedtime.   rosuvastatin 20 MG tablet Commonly known as: CRESTOR Take 20 mg by mouth daily.   sodium chloride 0.65 % Soln nasal spray Commonly known as: OCEAN Place 1 spray into both nostrils as needed for congestion.   Evaristo Bury FlexTouch 200 UNIT/ML FlexTouch Pen Generic drug: insulin degludec Inject 20 Units into the skin at bedtime.               Discharge Care Instructions  (From admission, onward)           Start     Ordered   06/04/23 0000  If the dressing is still on your incision site when you go home, remove it on the third day after your surgery date. Remove dressing if it begins to fall off, or if it is dirty or damaged before the third day.        06/04/23 1209            Follow-up Information     [name], PA-C. Schedule an appointment as soon as possible for a visit in 1 week(s).   Specialty: Family Medicine Contact information: [address] [place] [address] [phone]                Allergies  Allergen Reactions   Amlodipine Besylate Rash and Other (See Comments)    dizziness   Reglan [Metoclopramide] Other (See Comments)    hallucinations      Consultations: Nephrology   Procedures/Studies: DG Chest Port 1 View  Result Date: 06/03/2023 CLINICAL DATA:  Missed dialysis.  Malaise. EXAM: PORTABLE CHEST 1 VIEW COMPARISON:  05/26/2023 FINDINGS: The cardio pericardial silhouette is enlarged. Vascular congestion evident without overt pulmonary edema. No focal airspace consolidation or substantial pleural effusion. Left IJ central line remains in place. Telemetry leads overlie the chest. IMPRESSION: Enlargement of the cardiopericardial silhouette with vascular congestion. Electronically Signed   By: Kennith Center M.D.   On: 06/03/2023 07:44   DG C-Arm 1-60 Min  Result Date: 05/26/2023 CLINICAL DATA:  surgery, elective, dialysis catheter insertion EXAM: DG C-ARM 1-60 MIN COMPARISON:  11/18/2022 chest radiograph. FLUOROSCOPY TIME:  Radiation Exposure Index (if provided by the fluoroscopic device): 50.3 mGy FINDINGS: Spot fluoroscopic intraoperative chest radiograph demonstrates tip of superior approach left central venous catheter overlying the cavoatrial junction. IMPRESSION: Intraoperative fluoroscopic guidance for left central venous catheter placement. Electronically Signed   By: Delbert Phenix M.D.   On: 05/26/2023 14:50   DG CHEST PORT 1 VIEW  Result Date: 05/26/2023 CLINICAL DATA:  Status post dialysis catheter insertion. EXAM: PORTABLE CHEST 1 VIEW COMPARISON:  12/10/2022 FINDINGS: There is a left-sided dialysis catheter with tips at the cavoatrial junction. No signs of pneumothorax. Cardiac enlargement and low lung volumes. Mild left mid and left lower lung opacities are identified compatible with atelectasis versus airspace disease. IMPRESSION: 1. Satisfactory position of left-sided dialysis catheter. No pneumothorax. 2. Left midlung and left base opacities compatible with atelectasis or airspace disease. Electronically Signed   By: Signa Kell M.D.   On: 05/26/2023 13:33   US THYROID  Result Date: 05/21/2023 CLINICAL DATA:   52 year old female with a history of thyroid goiter EXAM: THYROID ULTRASOUND TECHNIQUE: Ultrasound examination of the thyroid gland and adjacent soft tissues was performed. COMPARISON:  01/04/2022 Biopsy of right inferior thyroid nodule 02/27/2022 FINDINGS: Parenchymal Echotexture: Mildly heterogenous Isthmus: 1.7 cm Right lobe: 6.1 cm x 2.2 cm x 2.8 cm Left lobe: 5.3 cm x 3.0 cm x 2.4 cm _________________________________________________________ Estimated total number of nodules >/= 1 cm: 2 Number of spongiform nodules >/=  2 cm not described below (TR1): 0 Number of mixed cystic and solid nodules >/= 1.5 cm not described below (TR2): 0 _________________________________________________________ Nodule labeled 1 inferior right thyroid, 3.0 cm, decreased from 4.6 cm. Nodule has undergone prior biopsy 01/04/2022. Assuming benign result, no further specific follow-up would be indicated. Nodule labeled 2, inferior right thyroid, 1.0 cm, TR 4. Nodule is considered low risk but meets criteria for surveillance. No adenopathy. Recommendations follow those established by the new ACR TI-RADS criteria (J Am Coll Radiol 2017;14:587-595). IMPRESSION: Redemonstration of heterogeneous enlarged thyroid, compatible with goiter. Rim calcified nodule labeled 2 in the inferior right thyroid nodule (1.1 cm, TR 4) meets criteria for surveillance, as designated by the newly established ACR TI-RADS criteria. Surveillance ultrasound study recommended to be performed annually up to 5 years. Recommendations follow those established by the new ACR TI-RADS criteria (J Am Coll Radiol 2017;14:587-595). Electronically Signed   By: Gilmer Mor D.O.   On: 05/21/2023 11:58   Panretinal Photocoagulation - OS - Left Eye  Result Date: 05/07/2023 LASER PROCEDURE NOTE Diagnosis:   Proliferative Diabetic Retinopathy, LEFT EYE Procedure:  Pan-retinal photocoagulation using slit lamp laser, LEFT EYE, fill-in Anesthesia:  Topical Surgeon: Rennis Chris,  MD, PhD Informed consent obtained, operative eye marked, and time out performed prior to initiation of laser. Lumenis [id number] slit lamp laser Pattern: 3x3 square Power: 300 mW Duration: 30 msec Spot size: 200 microns # spots: 1540 spots 360 fill-in Complications: None. RTC: wk of June 17th -- DFE/OCT, possible injxns Patient tolerated the procedure well and received written and verbal post-procedure care information/education.   OCT, Retina - OU - Both Eyes  Result Date: 05/07/2023 Right Eye Quality was good. Central Foveal Thickness: 265. Progression has improved. Findings include normal foveal contour, no SRF, intraretinal hyper-reflective material, intraretinal fluid (Focal IRHM and trace cystic changes temporal fovea -- slightly improved; IT schisis - stable). Left Eye Quality was good. Central Foveal Thickness: 439. Progression has been stable. Findings include no SRF, abnormal foveal contour, intraretinal hyper-reflective material, intraretinal fluid, vitreomacular adhesion (persistent  IRF/IRHM/edema temporal fovea; IN schisis caught on widefield). Notes *Images captured and stored on drive Diagnosis / Impression: +DME OU + peripheral retinoschisis OU OD: Focal IRHM and trace cystic changes temporal fovea -- slightly improved; IT schisis - stable OS: persistent IRF/IRHM/edema temporal fovea; IN schisis caught on widefield Clinical management: See below Abbreviations: NFP - Normal foveal profile. CME - cystoid macular edema. PED - pigment epithelial detachment. IRF - intraretinal fluid. SRF - subretinal fluid. EZ - ellipsoid zone. ERM - epiretinal membrane. ORA - outer retinal atrophy. ORT - outer retinal tubulation. SRHM - subretinal hyper-reflective material. IRHM - intraretinal hyper-reflective material     Discharge Exam: Vitals:   06/03/23 2128 06/04/23 0553  BP: (!) 157/63 (!) 149/71  Pulse: 80 80  Resp: 20 20  Temp: 97.8 F (36.6 C) 98.4 F (36.9 C)  SpO2: 100% 97%   Vitals:    06/03/23 1601 06/03/23 1930 06/03/23 2128 06/04/23 0553  BP: (!) 170/89  (!) 157/63 (!) 149/71  Pulse: 80  80 80  Resp: 18 16 20 20   Temp: 98.2 F (36.8 C)  97.8 F (36.6 C) 98.4 F (36.9 C)  TempSrc: Oral  Oral Oral  SpO2: 91%  100% 97%  Weight: 114.8 kg     Height: 5\' 3"  (1.6 m)       General: Pt is alert, awake, not in acute distress Cardiovascular: RRR, S1/S2 +, no rubs, no gallops Respiratory: CTA bilaterally, no wheezing, no rhonchi Abdominal: Soft, NT, ND, bowel sounds + Extremities: no edema, no cyanosis    The results of significant diagnostics from this hospitalization (including imaging, microbiology, ancillary and laboratory) are listed below for reference.     Microbiology: Recent Results (from the past 240 hour(s))  MRSA Next Gen by PCR, Nasal     Status: None   Collection Time: 05/26/23  3:09 PM   Specimen: Nasal Mucosa; Nasal Swab  Result Value Ref Range Status   MRSA by PCR Next Gen NOT DETECTED NOT DETECTED Final    Comment: (NOTE) The GeneXpert MRSA Assay (FDA approved for NASAL specimens only), is one component of a comprehensive MRSA colonization surveillance program. It is not intended to diagnose MRSA infection nor to guide or monitor treatment for MRSA infections. Test performance is not FDA approved in patients less than 57 years old. Performed at [facility], [address], [place], [place] [address]      Labs: BNP (last 3 results) Recent Labs    11/19/22 1408 06/03/23 0858  BNP 152.0* 318.0*   Basic Metabolic Panel: Recent Labs  Lab 06/03/23 0858 06/04/23 0509  NA 138 135  K 3.7 3.7  CL 100 96*  CO2 24 24  GLUCOSE 80 136*  BUN 53* 36*  CREATININE 10.88* 8.40*  CALCIUM 9.0 9.3  MG  --  1.8   Liver Function Tests: Recent Labs  Lab 06/03/23 0858  AST 16  ALT 8  ALKPHOS 64  BILITOT 0.5  PROT 7.2  ALBUMIN 4.0   No results for input(s): "LIPASE", "AMYLASE" in the last 168 hours. No results for input(s):  "AMMONIA" in the last 168 hours. CBC: Recent Labs  Lab 06/03/23 0858 06/04/23 0509  WBC 16.8* 15.1*  NEUTROABS 13.4*  --   HGB 6.8* 9.3*  HCT 21.7* 29.0*  MCV 93.1 90.6  PLT 224 250   Cardiac Enzymes: No results for input(s): "CKTOTAL", "CKMB", "CKMBINDEX", "TROPONINI" in the last 168 hours. BNP: Invalid input(s): "POCBNP" CBG: Recent Labs  Lab  05/28/23 1216 06/03/23 1238 06/03/23 2142 06/04/23 0731 06/04/23 1111  GLUCAP 177* 82 159* 138* 157*   D-Dimer No results for input(s): "DDIMER" in the last 72 hours. Hgb A1c No results for input(s): "HGBA1C" in the last 72 hours. Lipid Profile No results for input(s): "CHOL", "HDL", "LDLCALC", "TRIG", "CHOLHDL", "LDLDIRECT" in the last 72 hours. Thyroid function studies No results for input(s): "TSH", "T4TOTAL", "T3FREE", "THYROIDAB" in the last 72 hours.  Invalid input(s): "FREET3" Anemia work up No results for input(s): "VITAMINB12", "FOLATE", "FERRITIN", "TIBC", "IRON", "RETICCTPCT" in the last 72 hours. Urinalysis    Component Value Date/Time   COLORURINE STRAW (A) 12/22/2018 0847   APPEARANCEUR CLEAR 12/22/2018 0847   LABSPEC 1.007 12/22/2018 0847   PHURINE 9.0 (H) 12/22/2018 0847   GLUCOSEU >=500 (A) 12/22/2018 0847   HGBUR NEGATIVE 12/22/2018 0847   BILIRUBINUR NEGATIVE 12/22/2018 0847   KETONESUR NEGATIVE 12/22/2018 0847   PROTEINUR 100 (A) 12/22/2018 0847   NITRITE NEGATIVE 12/22/2018 0847   LEUKOCYTESUR NEGATIVE 12/22/2018 0847   Sepsis Labs Recent Labs  Lab 06/03/23 0858 06/04/23 0509  WBC 16.8* 15.1*   Microbiology Recent Results (from the past 240 hour(s))  MRSA Next Gen by PCR, Nasal     Status: None   Collection Time: 05/26/23  3:09 PM   Specimen: Nasal Mucosa; Nasal Swab  Result Value Ref Range Status   MRSA by PCR Next Gen NOT DETECTED NOT DETECTED Final    Comment: (NOTE) The GeneXpert MRSA Assay (FDA approved for NASAL specimens only), is one component of a comprehensive MRSA  colonization surveillance program. It is not intended to diagnose MRSA infection nor to guide or monitor treatment for MRSA infections. Test performance is not FDA approved in patients less than 71 years old. Performed at [facility], [address], [place], [place] [address]      Time coordinating discharge: 35 minutes  SIGNED:   Erick Blinks, DO [facility] 06/04/2023, 12:09 PM  If 7PM-7AM, please contact night-coverage [email or web address]

## 2023-06-04 NOTE — Progress Notes (Signed)
Subjective:  Able to do HD last night but she signed off early -  removed 2400 with no drop in BP and no special inpatient HD maneuvers-  approp bump in hgb with transfusion-  she c/o nausea but not new issue    Objective Vital signs in last 24 hours: Vitals:   06/03/23 1601 06/03/23 1930 06/03/23 2128 06/04/23 0553  BP: (!) 170/89  (!) 157/63 (!) 149/71  Pulse: 80  80 80  Resp: 18 16 20 20   Temp: 98.2 F (36.8 C)  97.8 F (36.6 C) 98.4 F (36.9 C)  TempSrc: Oral  Oral Oral  SpO2: 91%  100% 97%  Weight: 114.8 kg     Height: 5\' 3"  (1.6 m)      Weight change:   Intake/Output Summary (Last 24 hours) at 06/04/2023 0759 Last data filed at 06/03/2023 2100 Gross per 24 hour  Intake 878.33 ml  Output 2400 ml  Net -1521.67 ml   Dialysis Orders: MWF [facility]  4h 111kg  heparin 1000 bolus + 1800 u/hr drip   Assessment/ Plan: Pt is a 52 y.o. yo female who was admitted on 06/03/2023 with hypotension associated with OP HD and symptomatic anemia  Assessment/Plan: 1. Symptomatic anemia-  felt due to hematoma evac around access/ AVF revision at last hospitalization.  Hgb increased appropriately with transfusion yesterday 2. ESRD - normally MWF [facility]-  were able to complete HD yesterday ( only limited by her signing off)  no drop in BP-  did not require midodrine or albumin-  unclear what issue was at OP HD yesterday -  possibly the anemia was impacting her BP-  BP is high now 3. HTN/ volume-  dont know her EDW-  she says 111 so is still a little over-  no BP meds-  would keep that way for now 4. Secondary hyperparathyroidism-  on auryxia as OP-   will order here 5. Dispo-  from my standpoint could be discharged to resume OP HD tomorrow -  if still here tomorrow will run here   Cecille Aver    Labs: Basic Metabolic Panel: Recent Labs  Lab 06/03/23 0858 06/04/23 0509  NA 138 135  K 3.7 3.7  CL 100 96*  CO2 24 24  GLUCOSE 80 136*  BUN 53* 36*   CREATININE 10.88* 8.40*  CALCIUM 9.0 9.3   Liver Function Tests: Recent Labs  Lab 06/03/23 0858  AST 16  ALT 8  ALKPHOS 64  BILITOT 0.5  PROT 7.2  ALBUMIN 4.0   No results for input(s): "LIPASE", "AMYLASE" in the last 168 hours. No results for input(s): "AMMONIA" in the last 168 hours. CBC: Recent Labs  Lab 06/03/23 0858 06/04/23 0509  WBC 16.8* 15.1*  NEUTROABS 13.4*  --   HGB 6.8* 9.3*  HCT 21.7* 29.0*  MCV 93.1 90.6  PLT 224 250   Cardiac Enzymes: No results for input(s): "CKTOTAL", "CKMB", "CKMBINDEX", "TROPONINI" in the last 168 hours. CBG: Recent Labs  Lab 05/28/23 1216 06/03/23 1238 06/03/23 2142 06/04/23 0731  GLUCAP 177* 82 159* 138*    Iron Studies: No results for input(s): "IRON", "TIBC", "TRANSFERRIN", "FERRITIN" in the last 72 hours. Studies/Results: DG Chest Port 1 View  Result Date: 06/03/2023 CLINICAL DATA:  Missed dialysis.  Malaise. EXAM: PORTABLE CHEST 1 VIEW COMPARISON:  05/26/2023 FINDINGS: The cardio pericardial silhouette is enlarged. Vascular congestion evident without overt pulmonary edema. No focal airspace consolidation or substantial pleural effusion. Left IJ central line remains in  place. Telemetry leads overlie the chest. IMPRESSION: Enlargement of the cardiopericardial silhouette with vascular congestion. Electronically Signed   By: Kennith Center M.D.   On: 06/03/2023 07:44   Medications: Infusions:  anticoagulant sodium citrate      Scheduled Medications:  aspirin EC  81 mg Oral Daily   Chlorhexidine Gluconate Cloth  6 each Topical Q0600   docusate sodium  100 mg Oral Daily   ferric citrate  420 mg Oral BID WC   insulin glargine-yfgn  20 Units Subcutaneous QHS   loratadine  10 mg Oral Daily   montelukast  10 mg Oral QHS   multivitamin  1 tablet Oral Daily   pantoprazole  40 mg Oral Daily   rOPINIRole  1 mg Oral BID   rosuvastatin  20 mg Oral Daily    have reviewed scheduled and prn medications.  Physical  Exam: General: obese, NAD-  c/o nausea Heart:RRR Lungs: dec BS at bases Abdomen: obese, non tender Extremities: no peripheral edema  Dialysis Access: TDC and left AVF with bruit s/p revision     06/04/2023,7:59 AM  LOS: 0 days

## 2023-06-05 DIAGNOSIS — D631 Anemia in chronic kidney disease: Secondary | ICD-10-CM | POA: Diagnosis not present

## 2023-06-05 DIAGNOSIS — D509 Iron deficiency anemia, unspecified: Secondary | ICD-10-CM | POA: Diagnosis not present

## 2023-06-05 DIAGNOSIS — N2581 Secondary hyperparathyroidism of renal origin: Secondary | ICD-10-CM | POA: Diagnosis not present

## 2023-06-05 DIAGNOSIS — N250 Renal osteodystrophy: Secondary | ICD-10-CM | POA: Diagnosis not present

## 2023-06-05 DIAGNOSIS — N186 End stage renal disease: Secondary | ICD-10-CM | POA: Diagnosis not present

## 2023-06-05 DIAGNOSIS — Z992 Dependence on renal dialysis: Secondary | ICD-10-CM | POA: Diagnosis not present

## 2023-06-05 LAB — HEPATITIS B SURFACE ANTIBODY, QUANTITATIVE: Hep B S AB Quant (Post): 116 m[IU]/mL (ref >9.9)

## 2023-06-07 DIAGNOSIS — N250 Renal osteodystrophy: Secondary | ICD-10-CM | POA: Diagnosis not present

## 2023-06-07 DIAGNOSIS — N2581 Secondary hyperparathyroidism of renal origin: Secondary | ICD-10-CM | POA: Diagnosis not present

## 2023-06-07 DIAGNOSIS — N186 End stage renal disease: Secondary | ICD-10-CM | POA: Diagnosis not present

## 2023-06-07 DIAGNOSIS — D509 Iron deficiency anemia, unspecified: Secondary | ICD-10-CM | POA: Diagnosis not present

## 2023-06-07 DIAGNOSIS — D631 Anemia in chronic kidney disease: Secondary | ICD-10-CM | POA: Diagnosis not present

## 2023-06-07 DIAGNOSIS — Z992 Dependence on renal dialysis: Secondary | ICD-10-CM | POA: Diagnosis not present

## 2023-06-10 DIAGNOSIS — Z992 Dependence on renal dialysis: Secondary | ICD-10-CM | POA: Diagnosis not present

## 2023-06-10 DIAGNOSIS — N186 End stage renal disease: Secondary | ICD-10-CM | POA: Diagnosis not present

## 2023-06-10 DIAGNOSIS — D509 Iron deficiency anemia, unspecified: Secondary | ICD-10-CM | POA: Diagnosis not present

## 2023-06-10 DIAGNOSIS — N250 Renal osteodystrophy: Secondary | ICD-10-CM | POA: Diagnosis not present

## 2023-06-10 DIAGNOSIS — N2581 Secondary hyperparathyroidism of renal origin: Secondary | ICD-10-CM | POA: Diagnosis not present

## 2023-06-10 DIAGNOSIS — D631 Anemia in chronic kidney disease: Secondary | ICD-10-CM | POA: Diagnosis not present

## 2023-06-11 ENCOUNTER — Encounter (INDEPENDENT_AMBULATORY_CARE_PROVIDER_SITE_OTHER): Payer: Self-pay | Admitting: Ophthalmology

## 2023-06-11 ENCOUNTER — Ambulatory Visit (INDEPENDENT_AMBULATORY_CARE_PROVIDER_SITE_OTHER): Payer: Medicare Other | Admitting: Ophthalmology

## 2023-06-11 DIAGNOSIS — H33103 Unspecified retinoschisis, bilateral: Secondary | ICD-10-CM

## 2023-06-11 DIAGNOSIS — H35033 Hypertensive retinopathy, bilateral: Secondary | ICD-10-CM

## 2023-06-11 DIAGNOSIS — Z794 Long term (current) use of insulin: Secondary | ICD-10-CM

## 2023-06-11 DIAGNOSIS — I10 Essential (primary) hypertension: Secondary | ICD-10-CM

## 2023-06-11 DIAGNOSIS — E113513 Type 2 diabetes mellitus with proliferative diabetic retinopathy with macular edema, bilateral: Secondary | ICD-10-CM

## 2023-06-11 DIAGNOSIS — Z961 Presence of intraocular lens: Secondary | ICD-10-CM | POA: Diagnosis not present

## 2023-06-11 DIAGNOSIS — H25812 Combined forms of age-related cataract, left eye: Secondary | ICD-10-CM

## 2023-06-11 MED ORDER — BEVACIZUMAB CHEMO INJECTION 1.25MG/0.05ML SYRINGE FOR KALEIDOSCOPE
1.2500 mg | INTRAVITREAL | Status: AC | PRN
Start: 2023-06-11 — End: 2023-06-11
  Administered 2023-06-11: 1.25 mg via INTRAVITREAL

## 2023-06-11 NOTE — Progress Notes (Signed)
[facility] - Clinic Note  06/11/2023   CHIEF COMPLAINT Patient presents for Retina Follow Up  HISTORY OF PRESENT ILLNESS: Patricia [name] is a 52 y.o. female who presents to the clinic today for:  HPI     Retina Follow Up   Patient presents with  Diabetic Retinopathy.  In both eyes.  This started 4 weeks ago.  Duration of 4 weeks.  Since onset it is stable.  I, the attending physician,  performed the HPI with the patient and updated documentation appropriately.        Comments   4 week retina follow up PDR pt is reporting no vision changes noticed she had cataract surgery June 9th OD she has noticed some floaters denies any flashes       Last edited by Rennis Chris, MD on 06/11/2023 12:19 PM.    Pt had cataract sx OD with Dr. June Leap on June 7   Referring physician: [name], MD [id number] [place],  [place] [address]  HISTORICAL INFORMATION:  Selected notes from the MEDICAL RECORD NUMBER Referred by Dr. June Leap for macular edema LEE:  Ocular Hx- PMH-   CURRENT MEDICATIONS: No current outpatient medications on file. (Ophthalmic Drugs)   No current facility-administered medications for this visit. (Ophthalmic Drugs)   Current Outpatient Medications (Other)  Medication Sig   acetaminophen (TYLENOL) 325 MG tablet Take 650 mg by mouth every 6 (six) hours as needed for mild pain or fever.   albuterol (VENTOLIN HFA) 108 (90 Base) MCG/ACT inhaler Inhale 2 puffs into the lungs every 6 (six) hours as needed for wheezing or shortness of breath.   aspirin EC 81 MG tablet Take 1 tablet (81 mg total) by mouth daily. Swallow whole.   cetirizine (ZYRTEC) 10 MG tablet Take 1 tablet (10 mg total) by mouth daily.   docusate sodium (COLACE) 100 MG capsule Take 100 mg by mouth daily.   ferric citrate (AURYXIA) 1 GM 210 MG(Fe) tablet Take 420 mg by mouth 2 (two) times daily with a meal. With Breakfast & with supper   glipiZIDE (GLUCOTROL) 5 MG tablet Take 1  tablet (5 mg total) by mouth daily before breakfast.   insulin degludec (TRESIBA FLEXTOUCH) 200 UNIT/ML FlexTouch Pen Inject 20 Units into the skin at bedtime.   irbesartan (AVAPRO) 75 MG tablet Take 75 mg by mouth daily.   labetalol (NORMODYNE) 100 MG tablet Take 100 mg by mouth 2 (two) times daily.   montelukast (SINGULAIR) 10 MG tablet Take 1 tablet (10 mg total) by mouth at bedtime.   multivitamin (RENA-VIT) TABS tablet Take 1 tablet by mouth daily.   NIFEdipine (ADALAT CC) 30 MG 24 hr tablet Take 30 mg by mouth at bedtime.   nitroGLYCERIN (NITROSTAT) 0.4 MG SL tablet Place 1 tablet (0.4 mg total) under the tongue every 5 (five) minutes as needed for chest pain.   ondansetron (ZOFRAN) 4 MG tablet Take 1 tablet (4 mg total) by mouth daily as needed for nausea or vomiting.   ondansetron (ZOFRAN-ODT) 4 MG disintegrating tablet Take 1 tablet (4 mg total) by mouth every 8 (eight) hours as needed for nausea or vomiting.   oxyCODONE-acetaminophen (PERCOCET) 10-325 MG tablet Take 1 tablet by mouth every 6 (six) hours.   OXYGEN Inhale 2 L into the lungs as needed (exertion).   pantoprazole (PROTONIX) 40 MG tablet TAKE 1 TABLET (40 MG TOTAL) BY MOUTH DAILY 30 MINUTES BEFORE BREAKFAST (Patient taking differently: Take 40 mg by  mouth daily.)   rOPINIRole (REQUIP XL) 2 MG 24 hr tablet Take 2 mg by mouth at bedtime.    rosuvastatin (CRESTOR) 20 MG tablet Take 20 mg by mouth daily.   sodium chloride (OCEAN) 0.65 % SOLN nasal spray Place 1 spray into both nostrils as needed for congestion.   No current facility-administered medications for this visit. (Other)   REVIEW OF SYSTEMS: ROS   Positive for: Endocrine, Eyes, Respiratory Last edited by Etheleen Mayhew, COT on 06/11/2023  9:11 AM.     ALLERGIES Allergies  Allergen Reactions   Amlodipine Besylate Rash and Other (See Comments)    dizziness   Reglan [Metoclopramide] Other (See Comments)    hallucinations    PAST MEDICAL HISTORY Past  Medical History:  Diagnosis Date   Anemia    Ankle fracture    Arthritis    Asthma    Blood transfusion without reported diagnosis    Breast cancer (HCC)    Cancer (HCC)    COVID    Diabetes mellitus without complication (HCC)    Dyspnea    End stage renal disease on dialysis (HCC)    M/W/F [facility] in [place]   GERD (gastroesophageal reflux disease)    Hypertension    OSA (obstructive sleep apnea)    uses CPAP sometimes   Pneumonia    PONV (postoperative nausea and vomiting)    Wears glasses    Past Surgical History:  Procedure Laterality Date   ABDOMINAL HYSTERECTOMY     APPLICATION OF WOUND VAC Left 05/26/2023   Procedure: APPLICATION OF WOUND VAC;  Surgeon: Victorino Sparrow, MD;  Location: [facility] OR;  Service: Vascular;  Laterality: Left;   AV FISTULA PLACEMENT  11/2014   at [facility] hospital   AV FISTULA REPAIR N/A 04/2021   BALLOON DILATION N/A 07/10/2016   Procedure: BALLOON DILATION;  Surgeon: West Bali, MD;  Location: [facility]ITE;  Service: Endoscopy;  Laterality: N/A;  Pyloric dilation   BREAST LUMPECTOMY     CATARACT EXTRACTION W/PHACO Right 05/24/2023   Procedure: CATARACT EXTRACTION PHACO AND INTRAOCULAR LENS PLACEMENT (IOC);  Surgeon: [name], MD;  Location: [facility] ORS;  Service: Ophthalmology;  Laterality: Right;  CDE: 5.00   CESAREAN SECTION     CHOLECYSTECTOMY     COLONOSCOPY  02/2023   COLONOSCOPY WITH PROPOFOL N/A 09/27/2016   Dr. Jena Gauss: Internal hemorrhoids repeat colonoscopy in 10 years   COLONOSCOPY WITH PROPOFOL N/A 02/28/2023   Procedure: COLONOSCOPY WITH PROPOFOL;  Surgeon: Corbin Ade, MD;  Location: [facility];  Service: Endoscopy;  Laterality: N/A;  11:45 am, asa 3 dialysis pt   DILATION AND CURETTAGE OF UTERUS     ESOPHAGOGASTRODUODENOSCOPY N/A 07/10/2016   Dr.Fields- normal esophagus, gastric stenosis was found at the pylorus, gastritis on bx, normal examined duodenun   ESOPHAGOGASTRODUODENOSCOPY (EGD) WITH PROPOFOL N/A 11/21/2022    Procedure: ESOPHAGOGASTRODUODENOSCOPY (EGD) WITH PROPOFOL;  Surgeon: Dolores Frame, MD;  Location: [facility];  Service: Gastroenterology;  Laterality: N/A;   EXTERNAL FIXATION REMOVAL Right 10/29/2018   Procedure: REMOVAL RIGHT ANKLE BIOMET ZIMMER EXTERNAL FIXATOR, SHORT LEG CAST APPLICATION;  Surgeon: Eldred Manges, MD;  Location: [facility] OR;  Service: Orthopedics;  Laterality: Right;   INSERTION OF DIALYSIS CATHETER  05/26/2023   Procedure: INSERTION OF Left internal jugular DIALYSIS CATHETER;  Surgeon: Victorino Sparrow, MD;  Location: [facility] OR;  Service: Vascular;;   MASTECTOMY     left sided   ORIF ANKLE FRACTURE Right 10/06/2018  Procedure: OPEN REDUCTION INTERNAL FIXATION (ORIF) RIGHT ANKLE TRIMALLEOLAR;  Surgeon: Eldred Manges, MD;  Location: [facility] OR;  Service: Orthopedics;  Laterality: Right;   REVISON OF ARTERIOVENOUS FISTULA Left 05/26/2023   Procedure: REVISON OF ARTERIOVENOUS FISTULA LEFT ARM HEMATOMA WASH OUT;  Surgeon: Victorino Sparrow, MD;  Location: [facility] OR;  Service: Vascular;  Laterality: Left;   RIGHT/LEFT HEART CATH AND CORONARY ANGIOGRAPHY N/A 11/27/2022   Procedure: RIGHT/LEFT HEART CATH AND CORONARY ANGIOGRAPHY;  Surgeon: Swaziland, Peter M, MD;  Location: [facility] INVASIVE CV LAB;  Service: Cardiovascular;  Laterality: N/A;   FAMILY HISTORY Family History  Problem Relation Age of Onset   Diabetes Mellitus II Mother    Hypertension Mother    Heart block Mother    Hypertension Sister    Hypertension Sister    Colon cancer Neg Hx    SOCIAL HISTORY Social History   Tobacco Use   Smoking status: Never   Smokeless tobacco: Never  Vaping Use   Vaping Use: Never used  Substance Use Topics   Alcohol use: No    Alcohol/week: 0.0 standard drinks of alcohol   Drug use: No       OPHTHALMIC EXAM:  Base Eye Exam     Visual Acuity (Snellen - Linear)       Right Left   Dist [place] 20/100 20/50   Dist ph [place] 20/70 -2 20/40         Tonometry (Tonopen, 9:17 AM)        Right Left   Pressure 20 22  Squeezing         Pupils       Pupils Dark Light Shape React APD   Right PERRL 3 2 Round Brisk None   Left PERRL 3 2 Round Brisk None         Visual Fields       Left Right    Full Full         Extraocular Movement       Right Left    Full, Ortho Full, Ortho         Neuro/Psych     Oriented x3: Yes   Mood/Affect: Normal         Dilation     Both eyes: 2.5% Phenylephrine @ 9:16 AM           Slit Lamp and Fundus Exam     Slit Lamp Exam       Right Left   Lids/Lashes Dermatochalasis - upper lid Dermatochalasis - upper lid   Conjunctiva/Sclera mild melanosis mild melanosis   Cornea mild arcus, well healed cataract wound mild arcus, 1+ Punctate epithelial erosions   Anterior Chamber deep and clear deep and clear   Iris Round and dilated, No NVI Round and dilated, No NVI   Lens PC IOL in good position 2-3+ Nuclear sclerosis, 2-3+ Cortical cataract   Anterior Vitreous mild syneresis, old white VH settled inferiorly, scattered vitreous condensations mild syneresis         Fundus Exam       Right Left   Disc Pink and Sharp, no NVD mild Pallor, Sharp rim, no NVD   C/D Ratio 0.2 0.3   Macula Blunted foveal reflex, scattered MA, focal pigment clump temporal fovea Flat, Blunted foveal reflex, central edema and exudate, scattered MA   Vessels attenuated, mild tortuosity attenuated, mild tortuosity, mild copper wiring   Periphery Attached, scattered PRP 360 with room for fill in, scattered MA/DBH, peripheral schisis  nasally with old, white VH overlyng, red boat-shaped subhyaloid heme inferiorly Attached, 360 peripheral PRP with good early fill in changes, scattered patches of pre-retinal heme turning white           IMAGING AND PROCEDURES  Imaging and Procedures for 06/11/2023  OCT, Retina - OU - Both Eyes       Right Eye Quality was good. Central Foveal Thickness: 265. Progression has improved. Findings include normal  foveal contour, no SRF, intraretinal hyper-reflective material, intraretinal fluid (Focal IRHM and trace cystic changes temporal fovea -- slightly improved; IT schisis - stable, interval improvement in vitreous opacities).   Left Eye Quality was good. Central Foveal Thickness: 477. Progression has worsened. Findings include no SRF, abnormal foveal contour, intraretinal hyper-reflective material, intraretinal fluid, vitreomacular adhesion (persistent IRF/IRHM/edema temporal fovea -- slightly increased; IN schisis caught on widefield).   Notes *Images captured and stored on drive  Diagnosis / Impression:  +DME OU + peripheral retinoschisis OU OD: Focal IRHM and trace cystic changes temporal fovea -- slightly improved; IT schisis - stable, interval improvement in vitreous opacities OS: persistent IRF/IRHM/edema temporal fovea -- slightly increased; IN schisis caught on widefield  Clinical management:  See below  Abbreviations: NFP - Normal foveal profile. CME - cystoid macular edema. PED - pigment epithelial detachment. IRF - intraretinal fluid. SRF - subretinal fluid. EZ - ellipsoid zone. ERM - epiretinal membrane. ORA - outer retinal atrophy. ORT - outer retinal tubulation. SRHM - subretinal hyper-reflective material. IRHM - intraretinal hyper-reflective material      Intravitreal Injection, Pharmacologic Agent - OD - Right Eye       Time Out 06/11/2023. 10:18 AM. Confirmed correct patient, procedure, site, and patient consented.   Anesthesia Topical anesthesia was used. Anesthetic medications included Lidocaine 4%, Proparacaine 0.5%.   Procedure Preparation included 5% betadine to ocular surface, eyelid speculum. A supplied (32g) needle was used.   Injection: 1.25 mg Bevacizumab 1.25mg /0.17ml   Route: Intravitreal, Site: Right Eye   NDC: P3213405, Lot: [id number], Expiration date: 07/29/2023   Post-op Post injection exam found visual acuity of at least counting fingers. The  patient tolerated the procedure well. There were no complications. The patient received written and verbal post procedure care education. Post injection medications were not given.      Intravitreal Injection, Pharmacologic Agent - OS - Left Eye       Time Out 06/11/2023. 10:19 AM. Confirmed correct patient, procedure, site, and patient consented.   Anesthesia Topical anesthesia was used. Anesthetic medications included Lidocaine 2%, Proparacaine 0.5%.   Procedure Preparation included 5% betadine to ocular surface, eyelid speculum. A (32g) needle was used.   Injection: 1.25 mg Bevacizumab 1.25mg /0.82ml   Route: Intravitreal, Site: Left Eye   NDC: P3213405, Lot: [id number], Expiration date: 09/13/2023   Post-op Post injection exam found visual acuity of at least counting fingers. The patient tolerated the procedure well. There were no complications. The patient received written and verbal post procedure care education.           ASSESSMENT/PLAN:   ICD-10-CM   1. Proliferative diabetic retinopathy of both eyes with macular edema associated with type 2 diabetes mellitus (HCC)  E11.3513 OCT, Retina - OU - Both Eyes    Intravitreal Injection, Pharmacologic Agent - OD - Right Eye    Intravitreal Injection, Pharmacologic Agent - OS - Left Eye    Bevacizumab (AVASTIN) SOLN 1.25 mg    Bevacizumab (AVASTIN) SOLN 1.25 mg    2. 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 Essential hypertension  I10     5. Hypertensive retinopathy of both eyes  H35.033     6. Pseudophakia  Z96.1     7. Combined forms of age-related cataract of left eye  H25.812      1,2. Proliferative diabetic retinopathy, both eyes  - s/p IVA OS #1 (05.14.24)  - s/p IVA OD #1 (05.16.24)  - s/p PRP OS (05.21.24)  - previously followed with Dr. Dorette Grate at [facility], but has been lost to f/u since 10.28.2020  - h/o PRP OU; had never received anti-VEGF injxns prior to here, per  report - exam shows old white VH + some red subhyaloid heme OD; OS with focal clusters of preretinal heme turning white -- nasal and temporal periphery - FA (05.14.24) shows OD: Clusters of perivascular leakage inferior and temporal midzone -- ?early NV; OS: Clusters of NVE temporal midzone -- pt would benefit from fill in PRP OU (OS first) - OCT shows OD: Focal IRHM and trace cystic changes temporal fovea -- slightly improved; IT schisis - stable, interval improvement in vitreous opacities; OS: persistent IRF/IRHM/edema temporal fovea -- slightly increased; IN schisis caught on widefield - recommend IVA OU #2 today, 06.25 .24 - pt wishes to proceed with injections - RBA of procedure discussed, questions answered - see procedure note - Avastin informed consent obtained and signed 05.14.24 (OU) - f/u 4 wks, DFE, OCT, possible injection(s) - will plan on fill in PRP OD in next several mos  3. Retinoschisis OU  - peripheral retinoschisis confirmed by OCT  - OD -- inferotemporal periphery  - OS -- nasal periphery  - monitor  4,5. Hypertensive retinopathy OU - discussed importance of tight BP control - monitor  6. Pseudophakia OD  - s/p CE/IOL OD (Dr. June Leap, 06.07.24)  - IOL in good position, doing well  - monitor  7. Mixed Cataract OS - The symptoms of cataract, surgical options, and treatments and risks were discussed with patient. - discussed diagnosis and progression - under the expert management of Dr. June Leap - clear from a retina standpoint to proceed with cataract surgery when pt and surgeon are ready  Ophthalmic Meds Ordered this visit:  Meds ordered this encounter  Medications   Bevacizumab (AVASTIN) SOLN 1.25 mg   Bevacizumab (AVASTIN) SOLN 1.25 mg     Return in about 4 weeks (around 07/09/2023) for f/u PDR OU, DFE, OCT.  There are no Patient Instructions on file for this visit.  Explained the diagnoses, plan, and follow up with the patient and they expressed  understanding.  Patient expressed understanding of the importance of proper follow up care.   This document serves as a record of services personally performed by Karie Chimera, MD, PhD. It was created on their behalf by Glee Arvin. Manson Passey, OA an ophthalmic technician. The creation of this record is the provider's dictation and/or activities during the visit.    Electronically signed by: Glee Arvin. Kristopher Oppenheim 06.25.2024 12:35 PM  Karie Chimera, M.D., Ph.D. Diseases & Surgery of the Retina and Vitreous [facility] 06/11/2023  I have reviewed the above documentation for accuracy and completeness, and I agree with the above. Karie Chimera, M.D., Ph.D. 06/11/23 12:35 PM   Abbreviations: M myopia (nearsighted); A astigmatism; H hyperopia (farsighted); P presbyopia; Mrx spectacle prescription;  CTL contact lenses; OD right eye; OS left eye; OU both eyes  XT exotropia; ET esotropia; PEK punctate epithelial keratitis; PEE punctate epithelial erosions; DES dry eye syndrome; MGD meibomian gland dysfunction; ATs artificial tears; PFAT's preservative free artificial tears; NSC nuclear sclerotic cataract; PSC posterior subcapsular cataract; ERM epi-retinal membrane; PVD posterior vitreous detachment; RD retinal detachment; DM diabetes mellitus; DR diabetic retinopathy; NPDR non-proliferative diabetic retinopathy; PDR proliferative diabetic retinopathy; CSME clinically significant macular edema; DME diabetic macular edema; dbh dot blot hemorrhages; CWS cotton wool spot; POAG primary open angle glaucoma; C/D cup-to-disc ratio; HVF humphrey visual field; GVF goldmann visual field; OCT optical coherence tomography; IOP intraocular pressure; BRVO Branch retinal vein occlusion; CRVO central retinal vein occlusion; CRAO central retinal artery occlusion; BRAO branch retinal artery occlusion; RT retinal tear; SB scleral buckle; PPV pars plana vitrectomy; VH Vitreous hemorrhage; PRP panretinal laser  photocoagulation; IVK intravitreal kenalog; VMT vitreomacular traction; MH Macular hole;  NVD neovascularization of the disc; NVE neovascularization elsewhere; AREDS age related eye disease study; ARMD age related macular degeneration; POAG primary open angle glaucoma; EBMD epithelial/anterior basement membrane dystrophy; ACIOL anterior chamber intraocular lens; IOL intraocular lens; PCIOL posterior chamber intraocular lens; Phaco/IOL phacoemulsification with intraocular lens placement; PRK photorefractive keratectomy; LASIK laser assisted in situ keratomileusis; HTN hypertension; DM diabetes mellitus; COPD chronic obstructive pulmonary disease

## 2023-06-12 DIAGNOSIS — N186 End stage renal disease: Secondary | ICD-10-CM | POA: Diagnosis not present

## 2023-06-12 DIAGNOSIS — Z992 Dependence on renal dialysis: Secondary | ICD-10-CM | POA: Diagnosis not present

## 2023-06-12 DIAGNOSIS — D509 Iron deficiency anemia, unspecified: Secondary | ICD-10-CM | POA: Diagnosis not present

## 2023-06-12 DIAGNOSIS — N250 Renal osteodystrophy: Secondary | ICD-10-CM | POA: Diagnosis not present

## 2023-06-12 DIAGNOSIS — N2581 Secondary hyperparathyroidism of renal origin: Secondary | ICD-10-CM | POA: Diagnosis not present

## 2023-06-12 DIAGNOSIS — D631 Anemia in chronic kidney disease: Secondary | ICD-10-CM | POA: Diagnosis not present

## 2023-06-13 ENCOUNTER — Ambulatory Visit: Payer: Self-pay | Admitting: *Deleted

## 2023-06-13 ENCOUNTER — Encounter: Payer: Self-pay | Admitting: *Deleted

## 2023-06-13 NOTE — Patient Outreach (Signed)
Care Coordination   Follow Up Visit Note   06/13/2023 Name: Patricia [name] MRN: [id number] DOB: 27-Feb-1971  Patricia [name] is a 52 y.o. year old female who sees [name], [place] for primary care. I spoke with  Patricia [name] by phone today.  What matters to the patients health and wellness today?  Patient was on her way to the eye doctor for a follow-up when I called. She would like to maintain a stable hgb and prevent any more ED visits or hospital admissions.   Goals Addressed             This Visit's Progress    Manage Anemia   On track    Care Coordination Goals: Patient will schedule appointment with PCP and with vascular surgeon to f/u on fistula Patient will report any signs/symptoms of worsening anemia to provider or seek emergency medical attention if needed Patient will seek medical attention for any bleeding from fistula site Patient will have repeat CBC to check hgb at dialysis tomorrow Patient will reach out to RN Care Coordinator at [phone] with any resource or care coordination needs     Manage ESRD (on dialysis)   On track    Care Coordination Goals: Patient will keep dialysis appointments on M,W,F Patient will keep all medical appointments Will follow-up with PCP in July. Has talked to her by phone since hosp discharge. Will call to schedule hosp f/u with vascular surgeon to check revision of arteriovenous fistula left arm due to hematoma. Temporary catheter in place for HD access. On left side as well.  Patient will reach out to provider with any new or worsening symptoms Patient will follow directions for dressing changes and will keep he area clean and dry Patient will avoid deodorant, soap, lotion, etc in the area of the dressing Patient will reach out to RN Care Coordinator with any resource or care coordination needs        SDOH assessments and interventions completed:  Yes  SDOH Interventions Today    Flowsheet Row Most Recent Value  SDOH  Interventions   Transportation Interventions Intervention Not Indicated  Financial Strain Interventions Intervention Not Indicated        Care Coordination Interventions:  Yes, provided  Interventions Today    Flowsheet Row Most Recent Value  Chronic Disease   Chronic disease during today's visit Chronic Kidney Disease/End Stage Renal Disease (ESRD), Other  [anemia]  General Interventions   General Interventions Discussed/Reviewed General Interventions Discussed, General Interventions Reviewed, Labs, Doctor Visits  Labs Kidney Function  [hgb]  Doctor Visits Discussed/Reviewed Doctor Visits Discussed, Doctor Visits Reviewed, PCP, Specialist  PCP/Specialist Visits Compliance with follow-up visit  [Patient will schedule follow-up with PCP and vascular surgeon. Offered assistance with scheduling but she feels comfortable with scheduling herself.]  Education Interventions   Education Provided Provided Education  Provided Verbal Education On When to see the doctor, Nutrition, Labs, Medication  Labs Reviewed --  [Hgb 6.8 prior to transfussion. Last reading after transfussion was stable at 9.3]  Nutrition Interventions   Nutrition Discussed/Reviewed Nutrition Discussed, Nutrition Reviewed, Fluid intake  Pharmacy Interventions   Pharmacy Dicussed/Reviewed Pharmacy Topics Discussed, Pharmacy Topics Reviewed, Medications and their functions  Safety Interventions   Safety Discussed/Reviewed Safety Reviewed, Safety Discussed       Follow up plan: Follow up call scheduled for 06/27/23    Encounter Outcome:  Pt. Visit Completed   Patricia [name], BSN, RN-BC RN Care Coordinator [facility]  [facility] Direct Dial:  [phone] Main #: [phone]

## 2023-06-14 DIAGNOSIS — N186 End stage renal disease: Secondary | ICD-10-CM | POA: Diagnosis not present

## 2023-06-14 DIAGNOSIS — N2581 Secondary hyperparathyroidism of renal origin: Secondary | ICD-10-CM | POA: Diagnosis not present

## 2023-06-14 DIAGNOSIS — Z992 Dependence on renal dialysis: Secondary | ICD-10-CM | POA: Diagnosis not present

## 2023-06-14 DIAGNOSIS — D509 Iron deficiency anemia, unspecified: Secondary | ICD-10-CM | POA: Diagnosis not present

## 2023-06-14 DIAGNOSIS — D631 Anemia in chronic kidney disease: Secondary | ICD-10-CM | POA: Diagnosis not present

## 2023-06-14 DIAGNOSIS — N250 Renal osteodystrophy: Secondary | ICD-10-CM | POA: Diagnosis not present

## 2023-06-16 DIAGNOSIS — D509 Iron deficiency anemia, unspecified: Secondary | ICD-10-CM | POA: Diagnosis not present

## 2023-06-16 DIAGNOSIS — N186 End stage renal disease: Secondary | ICD-10-CM | POA: Diagnosis not present

## 2023-06-16 DIAGNOSIS — N2581 Secondary hyperparathyroidism of renal origin: Secondary | ICD-10-CM | POA: Diagnosis not present

## 2023-06-16 DIAGNOSIS — D631 Anemia in chronic kidney disease: Secondary | ICD-10-CM | POA: Diagnosis not present

## 2023-06-16 DIAGNOSIS — Z992 Dependence on renal dialysis: Secondary | ICD-10-CM | POA: Diagnosis not present

## 2023-06-16 DIAGNOSIS — N250 Renal osteodystrophy: Secondary | ICD-10-CM | POA: Diagnosis not present

## 2023-06-17 ENCOUNTER — Telehealth: Payer: Self-pay | Admitting: Pulmonary Disease

## 2023-06-17 DIAGNOSIS — D631 Anemia in chronic kidney disease: Secondary | ICD-10-CM | POA: Diagnosis not present

## 2023-06-17 DIAGNOSIS — D509 Iron deficiency anemia, unspecified: Secondary | ICD-10-CM | POA: Diagnosis not present

## 2023-06-17 DIAGNOSIS — N250 Renal osteodystrophy: Secondary | ICD-10-CM | POA: Diagnosis not present

## 2023-06-17 DIAGNOSIS — N2581 Secondary hyperparathyroidism of renal origin: Secondary | ICD-10-CM | POA: Diagnosis not present

## 2023-06-17 DIAGNOSIS — Z992 Dependence on renal dialysis: Secondary | ICD-10-CM | POA: Diagnosis not present

## 2023-06-17 DIAGNOSIS — N186 End stage renal disease: Secondary | ICD-10-CM | POA: Diagnosis not present

## 2023-06-17 NOTE — Telephone Encounter (Signed)
Patient states needs refill for Cetirizine. Pharmacy is [facility] [place]. Patient phone number is [phone].

## 2023-06-19 DIAGNOSIS — N2581 Secondary hyperparathyroidism of renal origin: Secondary | ICD-10-CM | POA: Diagnosis not present

## 2023-06-19 DIAGNOSIS — N250 Renal osteodystrophy: Secondary | ICD-10-CM | POA: Diagnosis not present

## 2023-06-19 DIAGNOSIS — E1122 Type 2 diabetes mellitus with diabetic chronic kidney disease: Secondary | ICD-10-CM | POA: Diagnosis not present

## 2023-06-19 DIAGNOSIS — D509 Iron deficiency anemia, unspecified: Secondary | ICD-10-CM | POA: Diagnosis not present

## 2023-06-19 DIAGNOSIS — E049 Nontoxic goiter, unspecified: Secondary | ICD-10-CM | POA: Diagnosis not present

## 2023-06-19 DIAGNOSIS — Z992 Dependence on renal dialysis: Secondary | ICD-10-CM | POA: Diagnosis not present

## 2023-06-19 DIAGNOSIS — N186 End stage renal disease: Secondary | ICD-10-CM | POA: Diagnosis not present

## 2023-06-19 DIAGNOSIS — D631 Anemia in chronic kidney disease: Secondary | ICD-10-CM | POA: Diagnosis not present

## 2023-06-20 LAB — LIPID PANEL
Chol/HDL Ratio: 1.6 ratio (ref 0.0–4.4)
Cholesterol, Total: 189 mg/dL (ref 100–199)
HDL: 120 mg/dL (ref >39)
LDL Chol Calc (NIH): 59 mg/dL (ref 0–99)
Triglycerides: 51 mg/dL (ref 0–149)
VLDL Cholesterol Cal: 10 mg/dL (ref 5–40)

## 2023-06-20 LAB — T4, FREE: Free T4: 1.37 ng/dL (ref 0.82–1.77)

## 2023-06-20 LAB — TSH: TSH: 1.36 u[IU]/mL (ref 0.450–4.500)

## 2023-06-21 DIAGNOSIS — N2581 Secondary hyperparathyroidism of renal origin: Secondary | ICD-10-CM | POA: Diagnosis not present

## 2023-06-21 DIAGNOSIS — N186 End stage renal disease: Secondary | ICD-10-CM | POA: Diagnosis not present

## 2023-06-21 DIAGNOSIS — D631 Anemia in chronic kidney disease: Secondary | ICD-10-CM | POA: Diagnosis not present

## 2023-06-21 DIAGNOSIS — D509 Iron deficiency anemia, unspecified: Secondary | ICD-10-CM | POA: Diagnosis not present

## 2023-06-21 DIAGNOSIS — N250 Renal osteodystrophy: Secondary | ICD-10-CM | POA: Diagnosis not present

## 2023-06-21 DIAGNOSIS — Z992 Dependence on renal dialysis: Secondary | ICD-10-CM | POA: Diagnosis not present

## 2023-06-21 MED ORDER — CETIRIZINE HCL 10 MG PO TABS: 10.0000 mg | ORAL_TABLET | Freq: Every day | ORAL | 3 refills | Status: DC

## 2023-06-21 NOTE — Telephone Encounter (Signed)
Called and spoke w/ pt let her know that I sent in a refill of her zyrtec. NFN att.

## 2023-06-24 DIAGNOSIS — N250 Renal osteodystrophy: Secondary | ICD-10-CM | POA: Diagnosis not present

## 2023-06-24 DIAGNOSIS — N2581 Secondary hyperparathyroidism of renal origin: Secondary | ICD-10-CM | POA: Diagnosis not present

## 2023-06-24 DIAGNOSIS — D509 Iron deficiency anemia, unspecified: Secondary | ICD-10-CM | POA: Diagnosis not present

## 2023-06-24 DIAGNOSIS — Z992 Dependence on renal dialysis: Secondary | ICD-10-CM | POA: Diagnosis not present

## 2023-06-24 DIAGNOSIS — D631 Anemia in chronic kidney disease: Secondary | ICD-10-CM | POA: Diagnosis not present

## 2023-06-24 DIAGNOSIS — N186 End stage renal disease: Secondary | ICD-10-CM | POA: Diagnosis not present

## 2023-06-25 ENCOUNTER — Ambulatory Visit (INDEPENDENT_AMBULATORY_CARE_PROVIDER_SITE_OTHER): Payer: Medicare Other | Admitting: "Endocrinology

## 2023-06-25 ENCOUNTER — Encounter: Payer: Self-pay | Admitting: "Endocrinology

## 2023-06-25 VITALS — BP 164/76 | HR 76 | Ht 63.0 in | Wt 254.2 lb

## 2023-06-25 DIAGNOSIS — Z992 Dependence on renal dialysis: Secondary | ICD-10-CM | POA: Diagnosis not present

## 2023-06-25 DIAGNOSIS — Z794 Long term (current) use of insulin: Secondary | ICD-10-CM

## 2023-06-25 DIAGNOSIS — E1122 Type 2 diabetes mellitus with diabetic chronic kidney disease: Secondary | ICD-10-CM | POA: Diagnosis not present

## 2023-06-25 DIAGNOSIS — N186 End stage renal disease: Secondary | ICD-10-CM | POA: Diagnosis not present

## 2023-06-25 DIAGNOSIS — E782 Mixed hyperlipidemia: Secondary | ICD-10-CM | POA: Diagnosis not present

## 2023-06-25 DIAGNOSIS — E049 Nontoxic goiter, unspecified: Secondary | ICD-10-CM | POA: Diagnosis not present

## 2023-06-25 LAB — POCT GLYCOSYLATED HEMOGLOBIN (HGB A1C): HbA1c, POC (controlled diabetic range): 7 % (ref 0.0–7.0)

## 2023-06-25 NOTE — Progress Notes (Signed)
[facility] - Clinic Note  07/09/2023   CHIEF COMPLAINT Patient presents for Retina Follow Up  HISTORY OF PRESENT ILLNESS: Patricia [name] is a 52 y.o. female who presents to the clinic today for:  HPI     Retina Follow Up   Patient presents with  Diabetic Retinopathy.  In both eyes.  This started 4 weeks ago.  Duration of 4 weeks.  Since onset it is stable.  I, the attending physician,  performed the HPI with the patient and updated documentation appropriately.        Comments   4 week retina follow up PDR OU and IVA OU pt had cataract surgery June 7th OD she is reporting that her eye has been more dry using refresh PF not really helping her vision has improved a little since surgery       Last edited by Rennis Chris, MD on 07/09/2023  1:09 PM.     Pt states OU is irritated, feels like she has sand in eyes. Using preservative free drops.    Referring physician: [name], MD [address] [place],  [place] [address]  HISTORICAL INFORMATION:  Selected notes from the MEDICAL RECORD NUMBER Referred by Dr. June Leap for macular edema LEE:  Ocular Hx- PMH-   CURRENT MEDICATIONS: No current outpatient medications on file. (Ophthalmic Drugs)   No current facility-administered medications for this visit. (Ophthalmic Drugs)   Current Outpatient Medications (Other)  Medication Sig   acetaminophen (TYLENOL) 325 MG tablet Take 650 mg by mouth every 6 (six) hours as needed for mild pain or fever.   albuterol (VENTOLIN HFA) 108 (90 Base) MCG/ACT inhaler Inhale 2 puffs into the lungs every 6 (six) hours as needed for wheezing or shortness of breath.   aspirin EC 81 MG tablet Take 1 tablet (81 mg total) by mouth daily. Swallow whole.   cetirizine (ZYRTEC) 10 MG tablet Take 1 tablet (10 mg total) by mouth daily.   docusate sodium (COLACE) 100 MG capsule Take 100 mg by mouth daily.   ferric citrate (AURYXIA) 1 GM 210 MG(Fe) tablet Take 420 mg by mouth 2 (two)  times daily with a meal. With Breakfast & with supper   glipiZIDE (GLUCOTROL) 5 MG tablet Take 1 tablet (5 mg total) by mouth daily before breakfast.   insulin degludec (TRESIBA FLEXTOUCH) 200 UNIT/ML FlexTouch Pen Inject 20 Units into the skin at bedtime.   irbesartan (AVAPRO) 75 MG tablet Take 75 mg by mouth daily.   labetalol (NORMODYNE) 100 MG tablet Take 100 mg by mouth 2 (two) times daily.   montelukast (SINGULAIR) 10 MG tablet Take 1 tablet (10 mg total) by mouth at bedtime.   multivitamin (RENA-VIT) TABS tablet Take 1 tablet by mouth daily.   NIFEdipine (ADALAT CC) 30 MG 24 hr tablet Take 30 mg by mouth at bedtime.   nitroGLYCERIN (NITROSTAT) 0.4 MG SL tablet Place 1 tablet (0.4 mg total) under the tongue every 5 (five) minutes as needed for chest pain.   ondansetron (ZOFRAN) 4 MG tablet Take 1 tablet (4 mg total) by mouth daily as needed for nausea or vomiting.   ondansetron (ZOFRAN-ODT) 4 MG disintegrating tablet Take 1 tablet (4 mg total) by mouth every 8 (eight) hours as needed for nausea or vomiting.   oxyCODONE-acetaminophen (PERCOCET) 10-325 MG tablet Take 1 tablet by mouth every 6 (six) hours.   OXYGEN Inhale 2 L into the lungs as needed (exertion).   pantoprazole (PROTONIX) 40 MG  tablet TAKE 1 TABLET (40 MG TOTAL) BY MOUTH DAILY 30 MINUTES BEFORE BREAKFAST (Patient taking differently: Take 40 mg by mouth daily.)   rOPINIRole (REQUIP XL) 2 MG 24 hr tablet Take 2 mg by mouth at bedtime.    rosuvastatin (CRESTOR) 20 MG tablet Take 20 mg by mouth daily.   sodium chloride (OCEAN) 0.65 % SOLN nasal spray Place 1 spray into both nostrils as needed for congestion.   No current facility-administered medications for this visit. (Other)   REVIEW OF SYSTEMS: ROS   Positive for: Endocrine, Eyes, Respiratory Last edited by Etheleen Mayhew, COT on 07/09/2023  8:58 AM.      ALLERGIES Allergies  Allergen Reactions   Amlodipine Besylate Rash and Other (See Comments)    dizziness    Reglan [Metoclopramide] Other (See Comments)    hallucinations    PAST MEDICAL HISTORY Past Medical History:  Diagnosis Date   Anemia    Ankle fracture    Arthritis    Asthma    Blood transfusion without reported diagnosis    Breast cancer (HCC)    Cancer (HCC)    COVID    Diabetes mellitus without complication (HCC)    Dyspnea    End stage renal disease on dialysis (HCC)    M/W/F [facility] in [place]   GERD (gastroesophageal reflux disease)    Hypertension    OSA (obstructive sleep apnea)    uses CPAP sometimes   Pneumonia    PONV (postoperative nausea and vomiting)    Wears glasses    Past Surgical History:  Procedure Laterality Date   ABDOMINAL HYSTERECTOMY     APPLICATION OF WOUND VAC Left 05/26/2023   Procedure: APPLICATION OF WOUND VAC;  Surgeon: Victorino Sparrow, MD;  Location: [facility] OR;  Service: Vascular;  Laterality: Left;   AV FISTULA PLACEMENT  11/2014   at [facility] hospital   AV FISTULA REPAIR N/A 04/2021   BALLOON DILATION N/A 07/10/2016   Procedure: BALLOON DILATION;  Surgeon: West Bali, MD;  Location: [facility];  Service: Endoscopy;  Laterality: N/A;  Pyloric dilation   BREAST LUMPECTOMY     CATARACT EXTRACTION W/PHACO Right 05/24/2023   Procedure: CATARACT EXTRACTION PHACO AND INTRAOCULAR LENS PLACEMENT (IOC);  Surgeon: [name], MD;  Location: [facility];  Service: Ophthalmology;  Laterality: Right;  CDE: 5.00   CESAREAN SECTION     CHOLECYSTECTOMY     COLONOSCOPY  02/2023   COLONOSCOPY WITH PROPOFOL N/A 09/27/2016   Dr. Jena Gauss: Internal hemorrhoids repeat colonoscopy in 10 years   COLONOSCOPY WITH PROPOFOL N/A 02/28/2023   Procedure: COLONOSCOPY WITH PROPOFOL;  Surgeon: Corbin Ade, MD;  Location: [facility];  Service: Endoscopy;  Laterality: N/A;  11:45 am, asa 3 dialysis pt   DILATION AND CURETTAGE OF UTERUS     ESOPHAGOGASTRODUODENOSCOPY N/A 07/10/2016   Dr.Fields- normal esophagus, gastric stenosis was found at the pylorus, gastritis  on bx, normal examined duodenun   ESOPHAGOGASTRODUODENOSCOPY (EGD) WITH PROPOFOL N/A 11/21/2022   Procedure: ESOPHAGOGASTRODUODENOSCOPY (EGD) WITH PROPOFOL;  Surgeon: Dolores Frame, MD;  Location: [facility];  Service: Gastroenterology;  Laterality: N/A;   EXTERNAL FIXATION REMOVAL Right 10/29/2018   Procedure: REMOVAL RIGHT ANKLE BIOMET ZIMMER EXTERNAL FIXATOR, SHORT LEG CAST APPLICATION;  Surgeon: Eldred Manges, MD;  Location: [facility] OR;  Service: Orthopedics;  Laterality: Right;   INSERTION OF DIALYSIS CATHETER  05/26/2023   Procedure: INSERTION OF Left internal jugular DIALYSIS CATHETER;  Surgeon: Victorino Sparrow, MD;  Location: [facility] OR;  Service: Vascular;;   MASTECTOMY     left sided   ORIF ANKLE FRACTURE Right 10/06/2018   Procedure: OPEN REDUCTION INTERNAL FIXATION (ORIF) RIGHT ANKLE TRIMALLEOLAR;  Surgeon: Eldred Manges, MD;  Location: [facility] OR;  Service: Orthopedics;  Laterality: Right;   REVISON OF ARTERIOVENOUS FISTULA Left 05/26/2023   Procedure: REVISON OF ARTERIOVENOUS FISTULA LEFT ARM HEMATOMA WASH OUT;  Surgeon: Victorino Sparrow, MD;  Location[facility] OR;  Service: Vascular;  Laterality: Left;   RIGHT/LEFT HEART CATH AND CORONARY ANGIOGRAPHY N/A 11/27/2022   Procedure: RIGHT/LEFT HEART CATH AND CORONARY ANGIOGRAPHY;  Surgeon: Swazil[place], Peter M, MD;  Location: [facility] INVASIVE CV LAB;  Service: Cardiovascular;  Laterality: N/A;   FAMILY HISTORY Family History  Problem Relation Age of Onset   Diabetes Mellitus II Mother    Hypertension Mother    Heart block Mother    Hypertension Sister    Hypertension Sister    Colon cancer Neg Hx    SOCIAL HISTORY Social History   Tobacco Use   Smoking status: Never   Smokeless tobacco: Never  Vaping Use   Vaping status: Never Used  Substance Use Topics   Alcohol use: No    Alcohol/week: 0.0 standard drinks of alcohol   Drug use: No       OPHTHALMIC EXAM:  Base Eye Exam     Visual Acuity (Snellen - Linear)       Right Left    Dist [place] 20/80 -1 20/50 -1   Dist ph [place] 20/70 -2 NI         Tonometry (Tonopen, 9:06 AM)       Right Left   Pressure 23 30  Squeezing         Pupils       Pupils Dark Light Shape React APD   Right PERRL 3 2 Round Brisk None   Left PERRL 3 2 Round Brisk None         Visual Fields       Left Right    Full Full         Extraocular Movement       Right Left    Full, Ortho Full, Ortho         Neuro/Psych     Oriented x3: Yes   Mood/Affect: Normal         Dilation     Both eyes: 2.5% Phenylephrine @ 9:06 AM           Slit Lamp and Fundus Exam     Slit Lamp Exam       Right Left   Lids/Lashes Dermatochalasis - upper lid Dermatochalasis - upper lid   Conjunctiva/Sclera mild melanosis mild melanosis   Cornea mild arcus, well healed cataract wound mild arcus, 1+ Punctate epithelial erosions   Anterior Chamber deep and clear deep and clear   Iris Round and reactive, No NVI Round and dilated, No NVI   Lens PC IOL in good position 2-3+ Nuclear sclerosis, 2-3+ Cortical cataract   Anterior Vitreous mild syneresis, old white VH settled inferiorly, scattered vitreous condensations mild syneresis, scattered fibrosis         Fundus Exam       Right Left   Disc Pink and Sharp, no NVD mild Pallor, Sharp rim, no NVD   C/D Ratio 0.2 0.3   Macula Blunted foveal reflex, scattered MA, focal pigment clump temporal fovea Flat, Blunted foveal reflex, central edema and exudate, scattered MA-slightly  improved   Vessels attenuated, mild tortuosity attenuated, mild tortuosity, mild copper wiring   Periphery Attached, scattered PRP 360 with room for fill in, scattered MA/DBH, peripheral schisis nasally with old, white VH overlyng, red boat-shaped subhyaloid heme inferiorly-improved Attached, 360 peripheral PRP with good early fill in changes, scattered patches of pre-retinal heme turning white           IMAGING AND PROCEDURES  Imaging and Procedures for  07/09/2023  OCT, Retina - OU - Both Eyes       Right Eye Quality was good. Central Foveal Thickness: 267. Progression has improved. Findings include normal foveal contour, no SRF, intraretinal hyper-reflective material, intraretinal fluid (Focal IRHM and trace cystic changes temporal fovea -- slightly improved; IT schisis - stable, interval improvement in vitreous opacities).   Left Eye Quality was good. Central Foveal Thickness: 453. Progression has improved. Findings include no SRF, abnormal foveal contour, intraretinal hyper-reflective material, intraretinal fluid, vitreomacular adhesion (persistent IRF/IRHM/edema temporal fovea -- slightly improved; IN schisis caught on widefield).   Notes *Images captured and stored on drive  Diagnosis / Impression:  +DME OU + peripheral retinoschisis OU OD: Focal IRHM and trace cystic changes temporal fovea -- slightly improved; IT schisis - stable, interval improvement in vitreous opacities OS: persistent IRF/IRHM/edema temporal fovea -- slightly improved; IN schisis caught on widefield--slightly improved   Clinical management:  See below  Abbreviations: NFP - Normal foveal profile. CME - cystoid macular edema. PED - pigment epithelial detachment. IRF - intraretinal fluid. SRF - subretinal fluid. EZ - ellipsoid zone. ERM - epiretinal membrane. ORA - outer retinal atrophy. ORT - outer retinal tubulation. SRHM - subretinal hyper-reflective material. IRHM - intraretinal hyper-reflective material      Intravitreal Injection, Pharmacologic Agent - OD - Right Eye       Time Out 07/09/2023. 10:52 AM. Confirmed correct patient, procedure, site, and patient consented.   Anesthesia Topical anesthesia was used. Anesthetic medications included Lidocaine 4%, Proparacaine 0.5%.   Procedure Preparation included 5% betadine to ocular surface, eyelid speculum. A supplied (32g) needle was used.   Injection: 1.25 mg Bevacizumab 1.25mg /0.82ml   Route:  Intravitreal, Site: Right Eye   NDC: P3213405, Lot: [id number], Expiration date: 07/28/2023   Post-op Post injection exam found visual acuity of at least counting fingers. The patient tolerated the procedure well. There were no complications. The patient received written and verbal post procedure care education. Post injection medications were not given.      Intravitreal Injection, Pharmacologic Agent - OS - Left Eye       Time Out 07/09/2023. 10:53 AM. Confirmed correct patient, procedure, site, and patient consented.   Anesthesia Topical anesthesia was used. Anesthetic medications included Lidocaine 2%, Proparacaine 0.5%.   Procedure Preparation included 5% betadine to ocular surface, eyelid speculum. A (32g) needle was used.   Injection: 1.25 mg Bevacizumab 1.25mg /0.1ml   Route: Intravitreal, Site: Left Eye   NDC: P3213405, Lot: [id number], Expiration date: 09/23/2023   Post-op Post injection exam found visual acuity of at least counting fingers. The patient tolerated the procedure well. There were no complications. The patient received written and verbal post procedure care education.           ASSESSMENT/PLAN:   ICD-10-CM   1. Proliferative diabetic retinopathy of both eyes with macular edema associated with type 2 diabetes mellitus (HCC)  E11.3513 OCT, Retina - OU - Both Eyes    Intravitreal Injection, Pharmacologic Agent - OD - Right Eye    Intravitreal Injection, Pharmacologic  Agent - OS - Left Eye    Bevacizumab (AVASTIN) SOLN 1.25 mg    Bevacizumab (AVASTIN) SOLN 1.25 mg    2. 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 Essential hypertension  I10     5. Hypertensive retinopathy of both eyes  H35.033     6. Pseudophakia  Z96.1      1,2. Proliferative diabetic retinopathy, both eyes  - s/p IVA OS #1 (05.14.24)  - s/p IVA OD #1 (05.16.24)  - s/p IVA OU # 2 (06.25.24)  - s/p PRP OS (05.21.24)  - previously  followed with Dr. Dorette Grate at [facility], but has been lost to f/u since 10.28.2020  - h/o PRP OU; had never received anti-VEGF injxns prior to here, per report - exam shows old white VH + some red subhyaloid heme OD; OS with focal clusters of preretinal heme turning white -- nasal and temporal periphery - FA (05.14.24) shows OD: Clusters of perivascular leakage inferior and temporal midzone -- ?early NV; OS: Clusters of NVE temporal midzone -- pt would benefit from fill in PRP OU (OS first--done 05.21.24) - OCT shows OD: Focal IRHM and trace cystic changes temporal fovea -- slightly improved; IT schisis - stable, interval improvement in vitreous opacities; OS: persistent IRF/IRHM/edema temporal fovea -- slightly improved; IN schisis caught on widefield--slightly improved  - recommend IVA OU #3 today, 07.23.24 - pt wishes to proceed with injections - RBA of procedure discussed, questions answered - see procedure note - Avastin informed consent obtained and signed 05.14.24 (OU) - f/u 4 wks, DFE, OCT, possible injection(s) - will plan on fill in PRP OD 8.1.24 @ 815AM  3. Retinoschisis OU  - peripheral retinoschisis confirmed by OCT  - OD -- inferotemporal periphery  - OS -- nasal periphery  - monitor  4,5. Hypertensive retinopathy OU - discussed importance of tight BP control - monitor  6. Pseudophakia OD  - s/p CE/IOL OD (Dr. June Leap, 06.07.24)  - IOL in good position, doing well  - monitor  7. Mixed Cataract OS - The symptoms of cataract, surgical options, and treatments and risks were discussed with patient. - discussed diagnosis and progression - under the expert management of Dr. June Leap - clear from a retina standpoint to proceed with cataract surgery when pt and surgeon are ready  Ophthalmic Meds Ordered this visit:  Meds ordered this encounter  Medications   Bevacizumab (AVASTIN) SOLN 1.25 mg   Bevacizumab (AVASTIN) SOLN 1.25 mg     Return in 9 days (on 07/18/2023) for fill in  PRP laser OD August 1, 815am.  There are no Patient Instructions on file for this visit.  Explained the diagnoses, plan, and follow up with the patient and they expressed understanding.  Patient expressed understanding of the importance of proper follow up care.   This document serves as a record of services personally performed by Karie Chimera, MD, PhD. It was created on their behalf by Gerilyn Nestle, COT an ophthalmic technician. The creation of this record is the provider's dictation and/or activities during the visit.    Electronically signed by:  Charlette Caffey, COT  07/09/23 1:12 PM  This document serves as a record of services personally performed by Karie Chimera, MD, PhD. It was created on their behalf by De Blanch, an ophthalmic technician. The creation of this record is the provider's dictation and/or activities during the visit.    Electronically  signed by: De Blanch, OA, 07/09/23  1:12 PM  Karie Chimera, M.D., Ph.D. Diseases & Surgery of the Retina and Vitreous [facility]  I have reviewed the above documentation for accuracy and completeness, and I agree with the above. Karie Chimera, M.D., Ph.D. 07/09/23 1:14 PM   Abbreviations: M myopia (nearsighted); A astigmatism; H hyperopia (farsighted); P presbyopia; Mrx spectacle prescription;  CTL contact lenses; OD right eye; OS left eye; OU both eyes  XT exotropia; ET esotropia; PEK punctate epithelial keratitis; PEE punctate epithelial erosions; DES dry eye syndrome; MGD meibomian gland dysfunction; ATs artificial tears; PFAT's preservative free artificial tears; NSC nuclear sclerotic cataract; PSC posterior subcapsular cataract; ERM epi-retinal membrane; PVD posterior vitreous detachment; RD retinal detachment; DM diabetes mellitus; DR diabetic retinopathy; NPDR non-proliferative diabetic retinopathy; PDR proliferative diabetic retinopathy; CSME clinically significant macular  edema; DME diabetic macular edema; dbh dot blot hemorrhages; CWS cotton wool spot; POAG primary open angle glaucoma; C/D cup-to-disc ratio; HVF humphrey visual field; GVF goldmann visual field; OCT optical coherence tomography; IOP intraocular pressure; BRVO Branch retinal vein occlusion; CRVO central retinal vein occlusion; CRAO central retinal artery occlusion; BRAO branch retinal artery occlusion; RT retinal tear; SB scleral buckle; PPV pars plana vitrectomy; VH Vitreous hemorrhage; PRP panretinal laser photocoagulation; IVK intravitreal kenalog; VMT vitreomacular traction; MH Macular hole;  NVD neovascularization of the disc; NVE neovascularization elsewhere; AREDS age related eye disease study; ARMD age related macular degeneration; POAG primary open angle glaucoma; EBMD epithelial/anterior basement membrane dystrophy; ACIOL anterior chamber intraocular lens; IOL intraocular lens; PCIOL posterior chamber intraocular lens; Phaco/IOL phacoemulsification with intraocular lens placement; PRK photorefractive keratectomy; LASIK laser assisted in situ keratomileusis; HTN hypertension; DM diabetes mellitus; COPD chronic obstructive pulmonary disease

## 2023-06-25 NOTE — Progress Notes (Signed)
06/25/2023, 2:43 PM   Endocrinology follow-up note  Subjective:    Patient ID: Patricia [name], female    DOB: Jul 17, 1971, PCP [name], PA-C   Past Medical History:  Diagnosis Date   Anemia    Ankle fracture    Arthritis    Asthma    Blood transfusion without reported diagnosis    Breast cancer (HCC)    Cancer (HCC)    COVID    Diabetes mellitus without complication (HCC)    Dyspnea    End stage renal disease on dialysis (HCC)    M/W/F [facility] [employer]   GERD (gastroesophageal reflux disease)    Hypertension    OSA (obstructive sleep apnea)    uses CPAP sometimes   Pneumonia    PONV (postoperative nausea and vomiting)    Wears glasses    Past Surgical History:  Procedure Laterality Date   ABDOMINAL HYSTERECTOMY     APPLICATION OF WOUND VAC Left 05/26/2023   Procedure: APPLICATION OF WOUND VAC;  Surgeon: Victorino Sparrow, MD;  Location: [facility] OR;  Service: Vascular;  Laterality: Left;   AV FISTULA PLACEMENT  11/2014   at [facility]   AV FISTULA REPAIR N/A 04/2021   BALLOON DILATION N/A 07/10/2016   Procedure: BALLOON DILATION;  Surgeon: West Bali, MD;  Location: [facility];  Service: Endoscopy;  Laterality: N/A;  Pyloric dilation   BREAST LUMPECTOMY     CATARACT EXTRACTION W/PHACO Right 05/24/2023   Procedure: CATARACT EXTRACTION PHACO AND INTRAOCULAR LENS PLACEMENT (IOC);  Surgeon: [name], MD;  Location: [facility] ORS;  Service: Ophthalmology;  Laterality: Right;  CDE: 5.00   CESAREAN SECTION     CHOLECYSTECTOMY     COLONOSCOPY  02/2023   COLONOSCOPY WITH PROPOFOL N/A 09/27/2016   Dr. Jena Gauss: Internal hemorrhoids repeat colonoscopy in 10 years   COLONOSCOPY WITH PROPOFOL N/A 02/28/2023   Procedure: COLONOSCOPY WITH PROPOFOL;  Surgeon: Corbin Ade, MD;  [facility]: [facility];  Service: Endoscopy;  Laterality: N/A;  11:45 am, asa 3 dialysis pt   DILATION AND CURETTAGE OF UTERUS      ESOPHAGOGASTRODUODENOSCOPY N/A 07/10/2016   Dr.Fields- normal esophagus, gastric stenosis was found at the pylorus, gastritis on bx, normal examined duodenun   ESOPHAGOGASTRODUODENOSCOPY (EGD) WITH PROPOFOL N/A 11/21/2022   Procedure: ESOPHAGOGASTRODUODENOSCOPY (EGD) WITH PROPOFOL;  Surgeon: Dolores Frame, MD;  Location: [facility];  Service: Gastroenterology;  Laterality: N/A;   EXTERNAL FIXATION REMOVAL Right 10/29/2018   Procedure: REMOVAL RIGHT ANKLE BIOMET ZIMMER EXTERNAL FIXATOR, SHORT LEG CAST APPLICATION;  Surgeon: Eldred Manges, MD;  Location: [facility] OR;  Service: Orthopedics;  Laterality: Right;   INSERTION OF DIALYSIS CATHETER  05/26/2023   Procedure: INSERTION OF Left internal jugular DIALYSIS CATHETER;  Surgeon: Victorino Sparrow, MD;  Location: [facility] OR;  Service: Vascular;;   MASTECTOMY     left sided   ORIF ANKLE FRACTURE Right 10/06/2018   Procedure: OPEN REDUCTION INTERNAL FIXATION (ORIF) RIGHT ANKLE TRIMALLEOLAR;  Surgeon: Eldred Manges, MD;  Location: [facility] OR;  Service: Orthopedics;  Laterality: Right;   REVISON OF ARTERIOVENOUS FISTULA Left 05/26/2023   Procedure: REVISON OF ARTERIOVENOUS FISTULA LEFT ARM HEMATOMA WASH OUT;  Surgeon:  Victorino Sparrow, MD;  Location: [facility] OR;  Service: Vascular;  Laterality: Left;   RIGHT/LEFT HEART CATH AND CORONARY ANGIOGRAPHY N/A 11/27/2022   Procedure: RIGHT/LEFT HEART CATH AND CORONARY ANGIOGRAPHY;  Surgeon: Swaziland, Peter M, MD;  Location: [facility] INVASIVE CV LAB;  Service: Cardiovascular;  Laterality: N/A;   Social History   Socioeconomic History   Marital status: Widowed    Spouse name: Not on file   Number of children: Not on file   Years of education: Not on file   Highest education level: Not on file  Occupational History   Not on file  Tobacco Use   Smoking status: Never   Smokeless tobacco: Never  Vaping Use   Vaping Use: Never used  Substance and Sexual Activity   Alcohol use: No    Alcohol/week: 0.0 standard drinks of  alcohol   Drug use: No   Sexual activity: Not Currently  Other Topics Concern   Not on file  Social History Narrative   Not on file   Social Determinants of Health   Financial Resource Strain: Low Risk  (06/13/2023)   Overall Financial Resource Strain (CARDIA)    Difficulty of Paying Living Expenses: Not very hard  Food Insecurity: No Food Insecurity (06/03/2023)   Hunger Vital Sign    Worried About Running Out of Food in the Last Year: Never true    Ran Out of Food in the Last Year: Never true  Transportation Needs: No Transportation Needs (06/13/2023)   PRAPARE - Administrator, Civil Service (Medical): No    Lack of Transportation (Non-Medical): No  Physical Activity: Not on file  Stress: Not on file  Social Connections: Not on file   Family History  Problem Relation Age of Onset   Diabetes Mellitus II Mother    Hypertension Mother    Heart block Mother    Hypertension Sister    Hypertension Sister    Colon cancer Neg Hx    Outpatient Encounter Medications as of 06/25/2023  Medication Sig   acetaminophen (TYLENOL) 325 MG tablet Take 650 mg by mouth every 6 (six) hours as needed for mild pain or fever.   albuterol (VENTOLIN HFA) 108 (90 Base) MCG/ACT inhaler Inhale 2 puffs into the lungs every 6 (six) hours as needed for wheezing or shortness of breath.   aspirin EC 81 MG tablet Take 1 tablet (81 mg total) by mouth daily. Swallow whole.   cetirizine (ZYRTEC) 10 MG tablet Take 1 tablet (10 mg total) by mouth daily.   docusate sodium (COLACE) 100 MG capsule Take 100 mg by mouth daily.   ferric citrate (AURYXIA) 1 GM 210 MG(Fe) tablet Take 420 mg by mouth 2 (two) times daily with a meal. With Breakfast & with supper   glipiZIDE (GLUCOTROL) 5 MG tablet Take 1 tablet (5 mg total) by mouth daily before breakfast.   insulin degludec (TRESIBA FLEXTOUCH) 200 UNIT/ML FlexTouch Pen Inject 20 Units into the skin at bedtime.   irbesartan (AVAPRO) 75 MG tablet Take 75 mg by  mouth daily.   labetalol (NORMODYNE) 100 MG tablet Take 100 mg by mouth 2 (two) times daily.   montelukast (SINGULAIR) 10 MG tablet Take 1 tablet (10 mg total) by mouth at bedtime. (Patient not taking: Reported on 06/25/2023)   multivitamin (RENA-VIT) TABS tablet Take 1 tablet by mouth daily.   NIFEdipine (ADALAT CC) 30 MG 24 hr tablet Take 30 mg by mouth at bedtime.   nitroGLYCERIN (NITROSTAT) 0.4 MG SL  tablet Place 1 tablet (0.4 mg total) under the tongue every 5 (five) minutes as needed for chest pain.   ondansetron (ZOFRAN) 4 MG tablet Take 1 tablet (4 mg total) by mouth daily as needed for nausea or vomiting.   ondansetron (ZOFRAN-ODT) 4 MG disintegrating tablet Take 1 tablet (4 mg total) by mouth every 8 (eight) hours as needed for nausea or vomiting.   oxyCODONE-acetaminophen (PERCOCET) 10-325 MG tablet Take 1 tablet by mouth every 6 (six) hours.   OXYGEN Inhale 2 L into the lungs as needed (exertion).   pantoprazole (PROTONIX) 40 MG tablet TAKE 1 TABLET (40 MG TOTAL) BY MOUTH DAILY 30 MINUTES BEFORE BREAKFAST (Patient taking differently: Take 40 mg by mouth daily.)   rOPINIRole (REQUIP XL) 2 MG 24 hr tablet Take 2 mg by mouth at bedtime.    rosuvastatin (CRESTOR) 20 MG tablet Take 20 mg by mouth daily.   sodium chloride (OCEAN) 0.65 % SOLN nasal spray Place 1 spray into both nostrils as needed for congestion.   No facility-administered encounter medications on file as of 06/25/2023.   ALLERGIES: Allergies  Allergen Reactions   Amlodipine Besylate Rash and Other (See Comments)    dizziness   Reglan [Metoclopramide] Other (See Comments)    hallucinations     VACCINATION STATUS: Immunization History  Administered Date(s) Administered   Influenza Split 09/20/2015   Influenza,trivalent, recombinat, inj, PF 09/20/2015   Influenza-Unspecified 09/30/2014, 08/25/2015, 09/06/2016, 08/19/2017, 10/04/2017, 10/15/2018, 09/25/2019, 10/03/2020, 08/31/2022   Moderna SARS-COV2 Booster Vaccination  11/07/2021   Moderna Sars-Covid-2 Vaccination 03/02/2020, 03/30/2020, 05/02/2020, 12/16/2020   PPD Test 08/10/2015, 09/06/2015, 02/10/2016, 09/09/2017, 10/20/2018, 11/23/2019, 11/30/2020, 11/08/2021, 12/06/2021   Pneumococcal Polysaccharide-23 01/03/2022   Pneumococcal-Unspecified 03/02/2016, 02/20/2017   Unspecified SARS-COV-2 Vaccination 03/02/2020, 03/30/2020    HPI Chan Ringenberg is 52 y.o. female who presents today with a medical history as above. she is being seen in follow-up after she was seen in consultation for management of type 2 diabetes complicated by ESRD, nodular goiter requested by [name], PA-C.   She is status post fine-needle aspiration biopsy with benign outcomes.  She has no new complaints about her thyroid today.   Her most recent thyroid ultrasound is consistent with stable findings compared to her prior imaging in January 2023. Her recent thyroid function tests are consistent with euthyroid state. She denies any prior history of thyroid dysfunction.  She denies dysphagia, shortness of breath, nor voice change.  She denies any family history of thyroid malignancy.    However, patient has personal history of breast cancer, status post lobectomy in 2019. She returns with improved glycemic control.  Her previsit labs show A1c of 7%, overall improving from 9.5%.  She is on low-dose insulin and glipizide.    Her type 2 diabetes is complicated by ESRD on hemodialysis.  The cause of  ESRD is said to be hypertension, diabetes.     She is increasingly getting comfortable using insulin She  has advanced COPD/asthma. Patient is on multiple medications.  Not currently on steroids for  Review of Systems  Constitutional: + She reports fluctuating body weight, + fatigue, no subjective hyperthermia, no subjective hypothermia   Objective:       06/25/2023   11:42 AM 06/25/2023   11:26 AM 06/04/2023    5:53 AM  Vitals with BMI  Height  5\' 3"    Weight  254 lbs 3 oz    BMI  45.04   Systolic 164 164 130  Diastolic 76 76 71  Pulse  76 80    BP (!) 164/76 Comment: R arm with manuel cuff  Pulse 76   Ht 5\' 3"  (1.6 m)   Wt 254 lb 3.2 oz (115.3 kg)   BMI 45.03 kg/m   Wt Readings from Last 3 Encounters:  06/25/23 254 lb 3.2 oz (115.3 kg)  06/03/23 253 lb 1.4 oz (114.8 kg)  05/27/23 253 lb 1.4 oz (114.8 kg)    Physical Exam  Constitutional:  Body mass index is 45.03 kg/m.,  not in acute distress, normal state of mind Eyes: PERRLA, EOMI, no exophthalmos ENT: moist mucous membranes, + gross thyromegaly, no gross cervical lymphadenopathy   CMP ( most recent) CMP     Component Value Date/Time   NA 135 06/04/2023 0509   K 3.7 06/04/2023 0509   CL 96 (L) 06/04/2023 0509   CO2 24 06/04/2023 0509   GLUCOSE 136 (H) 06/04/2023 0509   BUN 36 (H) 06/04/2023 0509   CREATININE 8.40 (H) 06/04/2023 0509   CREATININE 5.93 (H) 06/29/2019 1210   CALCIUM 9.3 06/04/2023 0509   PROT 7.2 06/03/2023 0858   PROT 7.8 05/17/2022 1607   ALBUMIN 4.0 06/03/2023 0858   ALBUMIN 4.9 05/17/2022 1607   AST 16 06/03/2023 0858   ALT 8 06/03/2023 0858   ALKPHOS 64 06/03/2023 0858   BILITOT 0.5 06/03/2023 0858   BILITOT 0.4 05/17/2022 1607   GFRNONAA 5 (L) 06/04/2023 0509   GFRNONAA 8 (L) 06/29/2019 1210   GFRAA 4 (L) 08/04/2020 0452   GFRAA 9 (L) 06/29/2019 1210     Diabetic Labs (most recent): Lab Results  Component Value Date   HGBA1C 7.0 06/25/2023   HGBA1C 6.2 03/07/2023   HGBA1C 9.5 (H) 11/19/2022     Lipid Panel ( most recent) Lipid Panel     Component Value Date/Time   CHOL 189 06/19/2023 1023   TRIG 51 06/19/2023 1023   HDL 120 06/19/2023 1023   CHOLHDL 1.6 06/19/2023 1023   CHOLHDL 1.9 01/04/2022 1003   VLDL 10 01/04/2022 1003   LDLCALC 59 06/19/2023 1023   LABVLDL 10 06/19/2023 1023   Summary of her thyroid ultrasound from January 05, 2022: Right lobe 7.1 cm, 4.6 cm nodule Left lobe 4.5 cm.   Fine-needle aspiration biopsy on February 27, 2022: FINAL MICROSCOPIC DIAGNOSIS:  - Consistent with benign follicular nodule (Bethesda category II)   SPECIMEN ADEQUACY:  Satisfactory for evaluation    Assessment & Plan:   1.  Type 2 diabetes-uncontrolled Complicated by ESRD currently on hemodialysis. Based on her continued hyperglycemic response with recent A1c of 7%, she would not need further escalation in her medications.  She is advised to continue Tresiba 20 units nightly associated with monitoring of blood glucose twice a day-daily before breakfast and at bedtime.    -She is encouraged to call clinic for hypoglycemia of less than 70 and persistent hyperglycemia > 200 mg /dl at fasting 3 days in a week.  In the meantime, she is advised to continue glipizide 5 mg p.o. daily at breakfast.    She is not a candidate for metformin.  She will be considered for GLP1 RA as appropriate on subsequent visits. She has controlled lipid panel with most recent LDL of 56.  She is advised to continue Crestor 20 mg p.o. nightly.  Side effects and precautions discussed with her.    2. Nodular goiter I reviewed her recent thyroid ultrasound and thyroid function test labs with her. Her thyroid function tests are  consistent with euthyroid state.  She will not need intervention with prescription.    - she has nodular goiter which was worked up completely including with fine-needle aspiration with benign outcomes.  Her recent thyroid ultrasound shows similar findings.  She will not need surgical intervention at this time.  She does not have any compressive concerns at this time.  Due to the sheer size of her thyroid which is asymmetrically enlarged with 7.1 cm right lobe, 4.5 cm left lobe , she will need surveillance thyroid ultrasound in 1 year, to document stability.  She has several comorbid conditions including type 2 diabetes, hypertension, ESRD on hemodialysis, hyperlipidemia, COPD/asthma, hyperlipidemia.  - she is advised to maintain close  follow up with [name], PA-C for primary care needs.   I spent  25  minutes in the care of the patient today including review of labs from Thyroid Function, CMP, and other relevant labs ; imaging/biopsy records (current and previous including abstractions from other facilities); face-to-face time discussing  her lab results and symptoms, medications doses, her options of short and [place] term treatment based on the latest standards of care / guidelines;   and documenting the encounter.  Jaydyn Navejas  participated in the discussions, expressed understanding, and voiced agreement with the above plans.  All questions were answered to her satisfaction. she is encouraged to contact clinic should she have any questions or concerns prior to her return visit.    Follow up plan: Return in about 6 months (around 12/26/2023) for Bring Meter/CGM Device/Logs- A1c in Office.   Marquis Lunch, MD [facility] [address] [place], [place] [address] Phone: [phone]  Fax: [phone]     06/25/2023, 2:43 PM  This note was partially dictated with voice recognition software. Similar sounding words can be transcribed inadequately or may not  be corrected upon review.

## 2023-06-25 NOTE — Patient Instructions (Signed)

## 2023-06-26 ENCOUNTER — Telehealth: Payer: Self-pay

## 2023-06-26 DIAGNOSIS — D631 Anemia in chronic kidney disease: Secondary | ICD-10-CM | POA: Diagnosis not present

## 2023-06-26 DIAGNOSIS — N250 Renal osteodystrophy: Secondary | ICD-10-CM | POA: Diagnosis not present

## 2023-06-26 DIAGNOSIS — N186 End stage renal disease: Secondary | ICD-10-CM | POA: Diagnosis not present

## 2023-06-26 DIAGNOSIS — D509 Iron deficiency anemia, unspecified: Secondary | ICD-10-CM | POA: Diagnosis not present

## 2023-06-26 DIAGNOSIS — Z992 Dependence on renal dialysis: Secondary | ICD-10-CM

## 2023-06-26 DIAGNOSIS — N2581 Secondary hyperparathyroidism of renal origin: Secondary | ICD-10-CM | POA: Diagnosis not present

## 2023-06-26 NOTE — Telephone Encounter (Signed)
Caller: Mardelle Matte at [facility] in [place]  Concern: painful access, hard knot under skin, numbness/tingling in arm, bruit/thrill still present  Not accessed yet  Location: left arm  Description:  since surgery on 05/26/23, worsening  Procedure: Dialysis Access Surgery  Resolution: Appointment scheduled for next available on Dr. Karin Lieu' office day  Next Appt: Appointment scheduled for 06/28/23

## 2023-06-27 ENCOUNTER — Encounter: Payer: Self-pay | Admitting: *Deleted

## 2023-06-27 ENCOUNTER — Ambulatory Visit: Payer: Self-pay | Admitting: *Deleted

## 2023-06-27 NOTE — Patient Outreach (Signed)
  Care Coordination   Follow Up Visit Note   06/27/2023 Name: Patricia [name] MRN: [id number] DOB: 1971/11/02  Patricia [name] is a 52 y.o. year old female who sees [name], [place] for primary care. I spoke with  Cydnee Dancy by phone today.  What matters to the patients health and wellness today?  Managing blood pressure. Pt is pleased with current A1C and blood sugar management. She reports having a lot going on right now and had an incoming call during our conversation. She was agreeable to a follow-up call in a couple of weeks and will call back sooner if needed.   Goals Addressed             This Visit's Progress    Manage Blood Sugar   On track    Care Coordination Goals: Patient will follow-up with PCP and/or endocrinologist every 3 months or as recommended Patient will take medication as prescribed and reach out to provider with any negative side effects Patient will continue to monitor and record blood sugar 3 times per day and as needed and will call PCP or endocrinologist with any readings outside of recommended range Patient will take blood sugar log and meter to provider visits for review Patient will follow a modified carbohydrate diet and decrease simple carbohydrates and sugars Patient will increase activity level as tolerated with an ultimate goal of at least 150 minutes of exercise per week Patient will check feet daily for sores, wounds, calluses, etc and will notify provider of any abnormal findings Patient will have yearly eye exams to check for or monitor diabetic retinopathy Patient will reach out to RN Care Coordinator [phone] with any care coordination or resource needs         SDOH assessments and interventions completed:  Yes  SDOH Interventions Today    Flowsheet Row Most Recent Value  SDOH Interventions   Transportation Interventions Intervention Not Indicated        Care Coordination Interventions:  Yes, provided  Interventions Today     Flowsheet Row Most Recent Value  Chronic Disease   Chronic disease during today's visit Diabetes  General Interventions   General Interventions Discussed/Reviewed General Interventions Discussed, General Interventions Reviewed, Labs, Durable Medical Equipment (DME), Doctor Visits  Labs Hgb A1c every 3 months  Doctor Visits Discussed/Reviewed Doctor Visits Discussed, Doctor Visits Reviewed, PCP, Specialist  Durable Medical Equipment (DME) Glucomoter  PCP/Specialist Visits Compliance with follow-up visit  [vascular appointment tomorrow]  Education Interventions   Provided Verbal Education On When to see the doctor, Labs, Blood Sugar Monitoring, Medication  Labs Reviewed Hgb A1c  [06/25/23 A1C 7.0]  Pharmacy Interventions   Pharmacy Dicussed/Reviewed Pharmacy Topics Discussed, Pharmacy Topics Reviewed, Medications and their functions       Follow up plan: Follow up call scheduled for 07/12/23    Encounter Outcome:  Pt. Visit Completed   Demetrios Loll, BSN, RN-BC RN Care Coordinator [facility]  [facility] Direct Dial: [phone] Main #: [phone]

## 2023-06-28 ENCOUNTER — Encounter: Payer: Self-pay | Admitting: Vascular Surgery

## 2023-06-28 ENCOUNTER — Ambulatory Visit (INDEPENDENT_AMBULATORY_CARE_PROVIDER_SITE_OTHER): Payer: Medicare Other | Admitting: Vascular Surgery

## 2023-06-28 ENCOUNTER — Ambulatory Visit (HOSPITAL_COMMUNITY)
Admission: RE | Admit: 2023-06-28 | Discharge: 2023-06-28 | Disposition: A | Discharge status: Home / Self Care | Payer: Medicare Other | Source: Ambulatory Visit | Attending: Vascular Surgery | Admitting: Vascular Surgery

## 2023-06-28 VITALS — BP 160/73 | HR 79 | Temp 97.9°F | Resp 20 | Ht 63.0 in | Wt 248.0 lb

## 2023-06-28 DIAGNOSIS — N186 End stage renal disease: Secondary | ICD-10-CM | POA: Insufficient documentation

## 2023-06-28 DIAGNOSIS — N2581 Secondary hyperparathyroidism of renal origin: Secondary | ICD-10-CM | POA: Diagnosis not present

## 2023-06-28 DIAGNOSIS — Z992 Dependence on renal dialysis: Secondary | ICD-10-CM

## 2023-06-28 DIAGNOSIS — N250 Renal osteodystrophy: Secondary | ICD-10-CM | POA: Diagnosis not present

## 2023-06-28 DIAGNOSIS — D631 Anemia in chronic kidney disease: Secondary | ICD-10-CM | POA: Diagnosis not present

## 2023-06-28 DIAGNOSIS — D509 Iron deficiency anemia, unspecified: Secondary | ICD-10-CM | POA: Diagnosis not present

## 2023-06-28 NOTE — Progress Notes (Signed)
Office Note    HPI: Patricia [name] is a 52 y.o. ([birth year]) female presenting in follow-up status post emergent left upper arm fistula revision for infiltration resulting in large hematoma, nerve compression, paresthesias in the hand -05/26/2023.   On exam, she was doing well.  She stated the left arm had been healing nicely.  She denied any drainage.  Staple site still intact. She has continued to have some numbness and tingling, not in the hand, but rather at the fistula site, where the hematoma was evacuated.  This waxes and wanes.  She also notices some parathesias in the axilla.  Denies symptoms of steal syndrome. Currently being dialyzed through a tunneled HD line  Past Medical History:  Diagnosis Date   Anemia    Ankle fracture    Arthritis    Asthma    Blood transfusion without reported diagnosis    Breast cancer (HCC)    Cancer (HCC)    COVID    Diabetes mellitus without complication (HCC)    Dyspnea    End stage renal disease on dialysis (HCC)    M/W/F [facility] in    GERD (gastroesophageal reflux disease)    Hypertension    OSA (obstructive sleep apnea)    uses CPAP sometimes   Pneumonia    PONV (postoperative nausea and vomiting)    Wears glasses     Past Surgical History:  Procedure Laterality Date   ABDOMINAL HYSTERECTOMY     APPLICATION OF WOUND VAC Left 05/26/2023   Procedure: APPLICATION OF WOUND VAC;  Surgeon: Victorino Sparrow, MD;  Location: [facility] OR;  Service: Vascular;  Laterality: Left;   AV FISTULA PLACEMENT  11/2014   at [facility]   AV FISTULA REPAIR N/A 04/2021   BALLOON DILATION N/A 07/10/2016   Procedure: BALLOON DILATION;  Surgeon: West Bali, MD;  Location: [facility] SUITE;  Service: Endoscopy;  Laterality: N/A;  Pyloric dilation   BREAST LUMPECTOMY     CATARACT EXTRACTION W/PHACO Right 05/24/2023   Procedure: CATARACT EXTRACTION PHACO AND INTRAOCULAR LENS PLACEMENT (IOC);  Surgeon: [name], MD;  Location: [facility] ORS;  Service:  Ophthalmology;  Laterality: Right;  CDE: 5.00   CESAREAN SECTION     CHOLECYSTECTOMY     COLONOSCOPY  02/2023   COLONOSCOPY WITH PROPOFOL N/A 09/27/2016   Dr. Jena Gauss: Internal hemorrhoids repeat colonoscopy in 10 years   COLONOSCOPY WITH PROPOFOL N/A 02/28/2023   Procedure: COLONOSCOPY WITH PROPOFOL;  Surgeon: Corbin Ade, MD;  Location: [facility];  Service: Endoscopy;  Laterality: N/A;  11:45 am, asa 3 dialysis pt   DILATION AND CURETTAGE OF UTERUS     ESOPHAGOGASTRODUODENOSCOPY N/A 07/10/2016   Dr.Fields- normal esophagus, gastric stenosis was found at the pylorus, gastritis on bx, normal examined duodenun   ESOPHAGOGASTRODUODENOSCOPY (EGD) WITH PROPOFOL N/A 11/21/2022   Procedure: ESOPHAGOGASTRODUODENOSCOPY (EGD) WITH PROPOFOL;  Surgeon: Dolores Frame, MD;  Location: [facility];  Service: Gastroenterology;  Laterality: N/A;   EXTERNAL FIXATION REMOVAL Right 10/29/2018   Procedure: REMOVAL RIGHT ANKLE BIOMET ZIMMER EXTERNAL FIXATOR, SHORT LEG CAST APPLICATION;  Surgeon: Eldred Manges, MD;  Location: [facility] OR;  Service: Orthopedics;  Laterality: Right;   INSERTION OF DIALYSIS CATHETER  05/26/2023   Procedure: INSERTION OF Left internal jugular DIALYSIS CATHETER;  Surgeon: Victorino Sparrow, MD;  Location: [facility] OR;  Service: Vascular;;   MASTECTOMY     left sided   ORIF ANKLE FRACTURE Right 10/06/2018   Procedure: OPEN REDUCTION INTERNAL FIXATION (ORIF)  RIGHT ANKLE TRIMALLEOLAR;  Surgeon: Eldred Manges, MD;  Location: [facility] OR;  Service: Orthopedics;  Laterality: Right;   REVISON OF ARTERIOVENOUS FISTULA Left 05/26/2023   Procedure: REVISON OF ARTERIOVENOUS FISTULA LEFT ARM HEMATOMA WASH OUT;  Surgeon: Victorino Sparrow, MD;  Location: [facility] OR;  Service: Vascular;  Laterality: Left;   RIGHT/LEFT HEART CATH AND CORONARY ANGIOGRAPHY N/A 11/27/2022   Procedure: RIGHT/LEFT HEART CATH AND CORONARY ANGIOGRAPHY;  Surgeon: Swazil[place], Peter M, MD;  Location: [facility] INVASIVE CV LAB;  Service:  Cardiovascular;  Laterality: N/A;    Social History   Socioeconomic History   Marital status: Widowed    Spouse name: Not on file   Number of children: Not on file   Years of education: Not on file   Highest education level: Not on file  Occupational History   Not on file  Tobacco Use   Smoking status: Never   Smokeless tobacco: Never  Vaping Use   Vaping status: Never Used  Substance and Sexual Activity   Alcohol use: No    Alcohol/week: 0.0 standard drinks of alcohol   Drug use: No   Sexual activity: Not Currently  Other Topics Concern   Not on file  Social History Narrative   Not on file   Social Determinants of Health   Financial Resource Strain: Low Risk  (06/13/2023)   Overall Financial Resource Strain (CARDIA)    Difficulty of Paying Living Expenses: Not very hard  Food Insecurity: No Food Insecurity (06/03/2023)   Hunger Vital Sign    Worried About Running Out of Food in the Last Year: Never true    Ran Out of Food in the Last Year: Never true  Transportation Needs: No Transportation Needs (06/27/2023)   PRAPARE - Administrator, Civil Service (Medical): No    Lack of Transportation (Non-Medical): No  Physical Activity: Unknown (06/16/2019)   Received from [facility], [facility]   Exercise Vital Sign    Days of Exercise per Week: 0 days    Minutes of Exercise per Session: Not on file  Stress: Not on file  Social Connections: Unknown (06/16/2019)   Received from [facility], [facility]   Social Connection and Isolation Panel [NHANES]    Frequency of Communication with Friends and Family: Patient declined    Frequency of Social Gatherings with Friends and Family: Patient declined    Attends Religious Services: Patient declined    Active Member of Clubs or Organizations: Patient declined    Attends Banker Meetings: Patient declined    Marital Status: Patient  declined  Intimate Partner Violence: Not At Risk (06/03/2023)   Humiliation, Afraid, Rape, and Kick questionnaire    Fear of Current or Ex-Partner: No    Emotionally Abused: No    Physically Abused: No    Sexually Abused: No   Family History  Problem Relation Age of Onset   Diabetes Mellitus II Mother    Hypertension Mother    Heart block Mother    Hypertension Sister    Hypertension Sister    Colon cancer Neg Hx     Current Outpatient Medications  Medication Sig Dispense Refill   acetaminophen (TYLENOL) 325 MG tablet Take 650 mg by mouth every 6 (six) hours as needed for mild pain or fever.     albuterol (VENTOLIN HFA) 108 (90 Base) MCG/ACT inhaler Inhale 2 puffs into the lungs every 6 (six) hours as needed for  wheezing or shortness of breath. 1 each 3   aspirin EC 81 MG tablet Take 1 tablet (81 mg total) by mouth daily. Swallow whole. 90 tablet 3   cetirizine (ZYRTEC) 10 MG tablet Take 1 tablet (10 mg total) by mouth daily. 30 tablet 3   docusate sodium (COLACE) 100 MG capsule Take 100 mg by mouth daily.     ferric citrate (AURYXIA) 1 GM 210 MG(Fe) tablet Take 420 mg by mouth 2 (two) times daily with a meal. With Breakfast & with supper     glipiZIDE (GLUCOTROL) 5 MG tablet Take 1 tablet (5 mg total) by mouth daily before breakfast. 180 tablet 0   insulin degludec (TRESIBA FLEXTOUCH) 200 UNIT/ML FlexTouch Pen Inject 20 Units into the skin at bedtime. 15 mL 1   irbesartan (AVAPRO) 75 MG tablet Take 75 mg by mouth daily.     labetalol (NORMODYNE) 100 MG tablet Take 100 mg by mouth 2 (two) times daily.     montelukast (SINGULAIR) 10 MG tablet Take 1 tablet (10 mg total) by mouth at bedtime. 30 tablet 0   multivitamin (RENA-VIT) TABS tablet Take 1 tablet by mouth daily.     NIFEdipine (ADALAT CC) 30 MG 24 hr tablet Take 30 mg by mouth at bedtime.     nitroGLYCERIN (NITROSTAT) 0.4 MG SL tablet Place 1 tablet (0.4 mg total) under the tongue every 5 (five) minutes as needed for chest pain.  100 tablet 3   ondansetron (ZOFRAN) 4 MG tablet Take 1 tablet (4 mg total) by mouth daily as needed for nausea or vomiting. 30 tablet 1   ondansetron (ZOFRAN-ODT) 4 MG disintegrating tablet Take 1 tablet (4 mg total) by mouth every 8 (eight) hours as needed for nausea or vomiting. 60 tablet 3   oxyCODONE-acetaminophen (PERCOCET) 10-325 MG tablet Take 1 tablet by mouth every 6 (six) hours.     OXYGEN Inhale 2 L into the lungs as needed (exertion).     pantoprazole (PROTONIX) 40 MG tablet TAKE 1 TABLET (40 MG TOTAL) BY MOUTH DAILY 30 MINUTES BEFORE BREAKFAST (Patient taking differently: Take 40 mg by mouth daily.) 90 tablet 3   rOPINIRole (REQUIP XL) 2 MG 24 hr tablet Take 2 mg by mouth at bedtime.      rosuvastatin (CRESTOR) 20 MG tablet Take 20 mg by mouth daily.     sodium chloride (OCEAN) 0.65 % SOLN nasal spray Place 1 spray into both nostrils as needed for congestion. 60 mL 0   No current facility-administered medications for this visit.    Allergies  Allergen Reactions   Amlodipine Besylate Rash and Other (See Comments)    dizziness   Reglan [Metoclopramide] Other (See Comments)    hallucinations      REVIEW OF SYSTEMS:  [X]  denotes positive finding, [ ]  denotes negative finding Cardiac  Comments:  Chest pain or chest pressure:    Shortness of breath upon exertion:    Short of breath when lying flat:    Irregular heart rhythm:        Vascular    Pain in calf, thigh, or hip brought on by ambulation:    Pain in feet at night that wakes you up from your sleep:     Blood clot in your veins:    Leg swelling:         Pulmonary    Oxygen at home:    Productive cough:     Wheezing:  Neurologic    Sudden weakness in arms or legs:     Sudden numbness in arms or legs:     Sudden onset of difficulty speaking or slurred speech:    Temporary loss of vision in one eye:     Problems with dizziness:         Gastrointestinal    Blood in stool:     Vomited blood:          Genitourinary    Burning when urinating:     Blood in urine:        Psychiatric    Major depression:         Hematologic    Bleeding problems:    Problems with blood clotting too easily:        Skin    Rashes or ulcers:        Constitutional    Fever or chills:      PHYSICAL EXAMINATION:  Vitals:   06/28/23 1021  BP: (!) 160/73  Pulse: 79  Resp: 20  Temp: 97.9 F (36.6 C)  SpO2: 97%  Weight: 248 lb (112.5 kg)  Height: 5\' 3"  (1.6 m)    General:  WDWN in NAD; vital signs documented above Gait: Not observed HENT: WNL, normocephalic Pulmonary: normal non-labored breathing , without wheezing Cardiac: regular HR Abdomen: soft, NT, no masses Skin: without rashes Vascular Exam/Pulses:  Right Left  Radial 2+ (normal) 2+ (normal)                       Excellent thrill in fistula.  Healing well.  2 areas of dermal ulceration, these were closed using Steri-Strips.  Extremities: without ischemic changes, without Gangrene , without cellulitis; with open wounds;  Musculoskeletal: no muscle wasting or atrophy  Neurologic: A&O X 3;  No focal weakness or paresthesias are detected Psychiatric:  The pt has Normal affect.   Non-Invasive Vascular Imaging:   900 mL of flow through the fistula.    ASSESSMENT/PLAN: Patricia [name] is a 52 y.o. female presenting status post 05/26/2023 fistula revision, large hematoma evacuation after infiltration during dialysis.  On exam today, she was doing well.  All staples were removed.  There were 2 areas at the aneurysmal portion of the fistula that Steri-Strips were placed, however this was just to promote dermal apposition, the fistula was not appreciated in the wound bed.  Ultrasound demonstrates excellent flow through the fistula.   My plan is to see her in 2 weeks to ensure she continues to heal appropriately. Fistula will be usable once she heals completely.    Victorino Sparrow, MD [facility] [phone]

## 2023-07-01 DIAGNOSIS — N186 End stage renal disease: Secondary | ICD-10-CM | POA: Diagnosis not present

## 2023-07-01 DIAGNOSIS — N250 Renal osteodystrophy: Secondary | ICD-10-CM | POA: Diagnosis not present

## 2023-07-01 DIAGNOSIS — D509 Iron deficiency anemia, unspecified: Secondary | ICD-10-CM | POA: Diagnosis not present

## 2023-07-01 DIAGNOSIS — D631 Anemia in chronic kidney disease: Secondary | ICD-10-CM | POA: Diagnosis not present

## 2023-07-01 DIAGNOSIS — Z992 Dependence on renal dialysis: Secondary | ICD-10-CM | POA: Diagnosis not present

## 2023-07-01 DIAGNOSIS — H524 Presbyopia: Secondary | ICD-10-CM | POA: Diagnosis not present

## 2023-07-01 DIAGNOSIS — N2581 Secondary hyperparathyroidism of renal origin: Secondary | ICD-10-CM | POA: Diagnosis not present

## 2023-07-03 DIAGNOSIS — Z992 Dependence on renal dialysis: Secondary | ICD-10-CM | POA: Diagnosis not present

## 2023-07-03 DIAGNOSIS — N186 End stage renal disease: Secondary | ICD-10-CM | POA: Diagnosis not present

## 2023-07-03 DIAGNOSIS — D631 Anemia in chronic kidney disease: Secondary | ICD-10-CM | POA: Diagnosis not present

## 2023-07-03 DIAGNOSIS — N2581 Secondary hyperparathyroidism of renal origin: Secondary | ICD-10-CM | POA: Diagnosis not present

## 2023-07-03 DIAGNOSIS — D509 Iron deficiency anemia, unspecified: Secondary | ICD-10-CM | POA: Diagnosis not present

## 2023-07-03 DIAGNOSIS — N250 Renal osteodystrophy: Secondary | ICD-10-CM | POA: Diagnosis not present

## 2023-07-05 ENCOUNTER — Ambulatory Visit: Payer: Medicare Other

## 2023-07-05 DIAGNOSIS — N250 Renal osteodystrophy: Secondary | ICD-10-CM | POA: Diagnosis not present

## 2023-07-05 DIAGNOSIS — Z992 Dependence on renal dialysis: Secondary | ICD-10-CM | POA: Diagnosis not present

## 2023-07-05 DIAGNOSIS — N2581 Secondary hyperparathyroidism of renal origin: Secondary | ICD-10-CM | POA: Diagnosis not present

## 2023-07-05 DIAGNOSIS — N186 End stage renal disease: Secondary | ICD-10-CM | POA: Diagnosis not present

## 2023-07-05 DIAGNOSIS — D509 Iron deficiency anemia, unspecified: Secondary | ICD-10-CM | POA: Diagnosis not present

## 2023-07-05 DIAGNOSIS — D631 Anemia in chronic kidney disease: Secondary | ICD-10-CM | POA: Diagnosis not present

## 2023-07-08 DIAGNOSIS — N186 End stage renal disease: Secondary | ICD-10-CM | POA: Diagnosis not present

## 2023-07-08 DIAGNOSIS — N2581 Secondary hyperparathyroidism of renal origin: Secondary | ICD-10-CM | POA: Diagnosis not present

## 2023-07-08 DIAGNOSIS — D509 Iron deficiency anemia, unspecified: Secondary | ICD-10-CM | POA: Diagnosis not present

## 2023-07-08 DIAGNOSIS — N250 Renal osteodystrophy: Secondary | ICD-10-CM | POA: Diagnosis not present

## 2023-07-08 DIAGNOSIS — D631 Anemia in chronic kidney disease: Secondary | ICD-10-CM | POA: Diagnosis not present

## 2023-07-08 DIAGNOSIS — Z992 Dependence on renal dialysis: Secondary | ICD-10-CM | POA: Diagnosis not present

## 2023-07-09 ENCOUNTER — Encounter (INDEPENDENT_AMBULATORY_CARE_PROVIDER_SITE_OTHER): Payer: Self-pay | Admitting: Ophthalmology

## 2023-07-09 ENCOUNTER — Ambulatory Visit (INDEPENDENT_AMBULATORY_CARE_PROVIDER_SITE_OTHER): Payer: Medicare Other | Admitting: Ophthalmology

## 2023-07-09 DIAGNOSIS — Z794 Long term (current) use of insulin: Secondary | ICD-10-CM

## 2023-07-09 DIAGNOSIS — Z961 Presence of intraocular lens: Secondary | ICD-10-CM

## 2023-07-09 DIAGNOSIS — H33103 Unspecified retinoschisis, bilateral: Secondary | ICD-10-CM | POA: Diagnosis not present

## 2023-07-09 DIAGNOSIS — E113513 Type 2 diabetes mellitus with proliferative diabetic retinopathy with macular edema, bilateral: Secondary | ICD-10-CM

## 2023-07-09 DIAGNOSIS — H35033 Hypertensive retinopathy, bilateral: Secondary | ICD-10-CM

## 2023-07-09 DIAGNOSIS — I10 Essential (primary) hypertension: Secondary | ICD-10-CM | POA: Diagnosis not present

## 2023-07-09 MED ORDER — BEVACIZUMAB CHEMO INJECTION 1.25MG/0.05ML SYRINGE FOR KALEIDOSCOPE
1.2500 mg | INTRAVITREAL | Status: AC | PRN
Start: 2023-07-09 — End: 2023-07-09
  Administered 2023-07-09: 1.25 mg via INTRAVITREAL

## 2023-07-10 DIAGNOSIS — Z992 Dependence on renal dialysis: Secondary | ICD-10-CM | POA: Diagnosis not present

## 2023-07-10 DIAGNOSIS — N250 Renal osteodystrophy: Secondary | ICD-10-CM | POA: Diagnosis not present

## 2023-07-10 DIAGNOSIS — N186 End stage renal disease: Secondary | ICD-10-CM | POA: Diagnosis not present

## 2023-07-10 DIAGNOSIS — D509 Iron deficiency anemia, unspecified: Secondary | ICD-10-CM | POA: Diagnosis not present

## 2023-07-10 DIAGNOSIS — D631 Anemia in chronic kidney disease: Secondary | ICD-10-CM | POA: Diagnosis not present

## 2023-07-10 DIAGNOSIS — N2581 Secondary hyperparathyroidism of renal origin: Secondary | ICD-10-CM | POA: Diagnosis not present

## 2023-07-11 ENCOUNTER — Telehealth: Payer: Self-pay | Admitting: *Deleted

## 2023-07-11 NOTE — Progress Notes (Signed)
[facility] - Clinic Note  07/18/2023   CHIEF COMPLAINT Patient presents for Retina Follow Up  HISTORY OF PRESENT ILLNESS: Patricia [name] is a 52 y.o. female who presents to the clinic today for:  HPI     Retina Follow Up   Patient presents with  Diabetic Retinopathy.  In both eyes.  This started 9 days ago.  Duration of 9 days.  Since onset it is stable.  I, the attending physician,  performed the HPI with the patient and updated documentation appropriately.        Comments   9 day retina follow up PDR and PRP OD pt is reporting no vision changes noticed she has noticed a floaters in her right eye but denies flashes pt last reading 97 last night       Last edited by Rennis Chris, MD on 07/18/2023  1:55 PM.    Here for PRP OD fill in    Referring physician: [name], MD [address] [place],  [place] [address]  HISTORICAL INFORMATION:  Selected notes from the MEDICAL RECORD NUMBER Referred by Dr. June Leap for macular edema LEE:  Ocular Hx- PMH-   CURRENT MEDICATIONS: No current outpatient medications on file. (Ophthalmic Drugs)   No current facility-administered medications for this visit. (Ophthalmic Drugs)   Current Outpatient Medications (Other)  Medication Sig   acetaminophen (TYLENOL) 325 MG tablet Take 650 mg by mouth every 6 (six) hours as needed for mild pain or fever.   albuterol (VENTOLIN HFA) 108 (90 Base) MCG/ACT inhaler Inhale 2 puffs into the lungs every 6 (six) hours as needed for wheezing or shortness of breath.   aspirin EC 81 MG tablet Take 1 tablet (81 mg total) by mouth daily. Swallow whole.   cetirizine (ZYRTEC) 10 MG tablet Take 1 tablet (10 mg total) by mouth daily.   docusate sodium (COLACE) 100 MG capsule Take 100 mg by mouth daily.   ferric citrate (AURYXIA) 1 GM 210 MG(Fe) tablet Take 420 mg by mouth 2 (two) times daily with a meal. With Breakfast & with supper   glipiZIDE (GLUCOTROL) 5 MG tablet Take 1 tablet (5 mg  total) by mouth daily before breakfast.   insulin degludec (TRESIBA FLEXTOUCH) 200 UNIT/ML FlexTouch Pen Inject 20 Units into the skin at bedtime.   irbesartan (AVAPRO) 75 MG tablet Take 75 mg by mouth daily.   labetalol (NORMODYNE) 100 MG tablet Take 100 mg by mouth 2 (two) times daily.   montelukast (SINGULAIR) 10 MG tablet Take 1 tablet (10 mg total) by mouth at bedtime.   multivitamin (RENA-VIT) TABS tablet Take 1 tablet by mouth daily.   NIFEdipine (ADALAT CC) 30 MG 24 hr tablet Take 30 mg by mouth at bedtime.   nitroGLYCERIN (NITROSTAT) 0.4 MG SL tablet Place 1 tablet (0.4 mg total) under the tongue every 5 (five) minutes as needed for chest pain.   ondansetron (ZOFRAN) 4 MG tablet Take 1 tablet (4 mg total) by mouth daily as needed for nausea or vomiting.   ondansetron (ZOFRAN-ODT) 4 MG disintegrating tablet Take 1 tablet (4 mg total) by mouth every 8 (eight) hours as needed for nausea or vomiting.   oxyCODONE-acetaminophen (PERCOCET) 10-325 MG tablet Take 1 tablet by mouth every 6 (six) hours.   OXYGEN Inhale 2 L into the lungs as needed (exertion).   pantoprazole (PROTONIX) 40 MG tablet TAKE 1 TABLET (40 MG TOTAL) BY MOUTH DAILY 30 MINUTES BEFORE BREAKFAST (Patient taking differently: Take  40 mg by mouth daily.)   rOPINIRole (REQUIP XL) 2 MG 24 hr tablet Take 2 mg by mouth at bedtime.    rosuvastatin (CRESTOR) 20 MG tablet Take 20 mg by mouth daily.   sodium chloride (OCEAN) 0.65 % SOLN nasal spray Place 1 spray into both nostrils as needed for congestion.   No current facility-administered medications for this visit. (Other)   REVIEW OF SYSTEMS: ROS   Positive for: Endocrine, Eyes, Respiratory Last edited by Etheleen Mayhew, COT on 07/18/2023  7:56 AM.     ALLERGIES Allergies  Allergen Reactions   Amlodipine Besylate Rash and Other (See Comments)    dizziness   Reglan [Metoclopramide] Other (See Comments)    hallucinations    PAST MEDICAL HISTORY Past Medical  History:  Diagnosis Date   Anemia    Ankle fracture    Arthritis    Asthma    Blood transfusion without reported diagnosis    Breast cancer (HCC)    Cancer (HCC)    COVID    Diabetes mellitus without complication (HCC)    Dyspnea    End stage renal disease on dialysis (HCC)    M/W/F [facility] in [place]   GERD (gastroesophageal reflux disease)    Hypertension    OSA (obstructive sleep apnea)    uses CPAP sometimes   Pneumonia    PONV (postoperative nausea and vomiting)    Wears glasses    Past Surgical History:  Procedure Laterality Date   ABDOMINAL HYSTERECTOMY     APPLICATION OF WOUND VAC Left 05/26/2023   Procedure: APPLICATION OF WOUND VAC;  Surgeon: Victorino Sparrow, MD;  Location: [facility] OR;  Service: Vascular;  Laterality: Left;   AV FISTULA PLACEMENT  11/2014   at [facility] hospital   AV FISTULA REPAIR N/A 04/2021   BALLOON DILATION N/A 07/10/2016   Procedure: BALLOON DILATION;  Surgeon: West Bali, MD;  Location: [facility] ENDO SUITE;  Service: Endoscopy;  Laterality: N/A;  Pyloric dilation   BREAST LUMPECTOMY     CATARACT EXTRACTION W/PHACO Right 05/24/2023   Procedure: CATARACT EXTRACTION PHACO AND INTRAOCULAR LENS PLACEMENT (IOC);  Surgeon: [name], MD;  Location: [facility] ORS;  Service: Ophthalmology;  Laterality: Right;  CDE: 5.00   CESAREAN SECTION     CHOLECYSTECTOMY     COLONOSCOPY  02/2023   COLONOSCOPY WITH PROPOFOL N/A 09/27/2016   Dr. Jena Gauss: Internal hemorrhoids repeat colonoscopy in 10 years   COLONOSCOPY WITH PROPOFOL N/A 02/28/2023   Procedure: COLONOSCOPY WITH PROPOFOL;  Surgeon: Corbin Ade, MD;  Location: [facility];  Service: Endoscopy;  Laterality: N/A;  11:45 am, asa 3 dialysis pt   DILATION AND CURETTAGE OF UTERUS     ESOPHAGOGASTRODUODENOSCOPY N/A 07/10/2016   Dr.Fields- normal esophagus, gastric stenosis was found at the pylorus, gastritis on bx, normal examined duodenun   ESOPHAGOGASTRODUODENOSCOPY (EGD) WITH PROPOFOL N/A 11/21/2022    Procedure: ESOPHAGOGASTRODUODENOSCOPY (EGD) WITH PROPOFOL;  Surgeon: Dolores Frame, MD;  Location: [facility];  Service: Gastroenterology;  Laterality: N/A;   EXTERNAL FIXATION REMOVAL Right 10/29/2018   Procedure: REMOVAL RIGHT ANKLE BIOMET ZIMMER EXTERNAL FIXATOR, SHORT LEG CAST APPLICATION;  Surgeon: Eldred Manges, MD;  Location: [facility] OR;  Service: Orthopedics;  Laterality: Right;   INSERTION OF DIALYSIS CATHETER  05/26/2023   Procedure: INSERTION OF Left internal jugular DIALYSIS CATHETER;  Surgeon: Victorino Sparrow, MD;  Location: [facility] OR;  Service: Vascular;;   MASTECTOMY     left sided   ORIF ANKLE  FRACTURE Right 10/06/2018   Procedure: OPEN REDUCTION INTERNAL FIXATION (ORIF) RIGHT ANKLE TRIMALLEOLAR;  Surgeon: Eldred Manges, MD;  Location: [facility] OR;  Service: Orthopedics;  Laterality: Right;   REVISON OF ARTERIOVENOUS FISTULA Left 05/26/2023   Procedure: REVISON OF ARTERIOVENOUS FISTULA LEFT ARM HEMATOMA WASH OUT;  Surgeon: Victorino Sparrow, MD;  Location: [facility] OR;  Service: Vascular;  Laterality: Left;   RIGHT/LEFT HEART CATH AND CORONARY ANGIOGRAPHY N/A 11/27/2022   Procedure: RIGHT/LEFT HEART CATH AND CORONARY ANGIOGRAPHY;  Surgeon: Swaziland, Peter M, MD;  Location: [facility] INVASIVE CV LAB;  Service: Cardiovascular;  Laterality: N/A;   FAMILY HISTORY Family History  Problem Relation Age of Onset   Diabetes Mellitus II Mother    Hypertension Mother    Heart block Mother    Hypertension Sister    Hypertension Sister    Colon cancer Neg Hx    SOCIAL HISTORY Social History   Tobacco Use   Smoking status: Never   Smokeless tobacco: Never  Vaping Use   Vaping status: Never Used  Substance Use Topics   Alcohol use: No    Alcohol/week: 0.0 standard drinks of alcohol   Drug use: No       OPHTHALMIC EXAM:  Base Eye Exam     Visual Acuity (Snellen - Linear)       Right Left   Dist [place] 20/80 -3 20/50 -2   Dist ph [place] 20/70 -3 20/40         Tonometry (Tonopen, 8:04 AM)        Right Left   Pressure 22 26  Squeezing         Pupils       Pupils Dark Light Shape React APD   Right PERRL 3 2 Round Brisk None   Left PERRL 3 2 Round Brisk None         Neuro/Psych     Oriented x3: Yes   Mood/Affect: Normal         Dilation     Right eye: 2.5% Phenylephrine @ 8:05 AM           Slit Lamp and Fundus Exam     Slit Lamp Exam       Right Left   Lids/Lashes Dermatochalasis - upper lid Dermatochalasis - upper lid   Conjunctiva/Sclera mild melanosis mild melanosis   Cornea mild arcus, well healed cataract wound mild arcus, 1+ Punctate epithelial erosions   Anterior Chamber deep and clear deep and clear   Iris Round and reactive, No NVI Round and dilated, No NVI   Lens PC IOL in good position 2-3+ Nuclear sclerosis, 2-3+ Cortical cataract   Anterior Vitreous mild syneresis, old white VH settled inferiorly, scattered vitreous condensations mild syneresis, scattered fibrosis         Fundus Exam       Right Left   Disc Pink and Sharp, no NVD mild Pallor, Sharp rim, no NVD   C/D Ratio 0.2 0.3   Macula Blunted foveal reflex, scattered MA, focal pigment clump temporal fovea Flat, Blunted foveal reflex, central edema and exudate, scattered MA-slightly improved   Vessels attenuated, mild tortuosity attenuated, mild tortuosity, mild copper wiring   Periphery Attached, scattered PRP 360 with room for fill in, scattered MA/DBH, peripheral schisis nasally with old, white VH overlyng, red boat-shaped subhyaloid heme inferiorly-improved Attached, 360 peripheral PRP with good early fill in changes, scattered patches of pre-retinal heme turning white  IMAGING AND PROCEDURES  Imaging and Procedures for 07/18/2023  OCT, Retina - OU - Both Eyes       Right Eye Quality was good. Central Foveal Thickness: 270. Progression has been stable. Findings include normal foveal contour, no SRF, intraretinal hyper-reflective material, intraretinal fluid  (Focal IRHM and trace cystic changes temporal fovea; IT schisis - stable, +vitreous opacities, partial PVD. ).   Left Eye Quality was good. Central Foveal Thickness: 434. Progression has improved. Findings include no SRF, abnormal foveal contour, intraretinal hyper-reflective material, intraretinal fluid, vitreomacular adhesion (Persistent IRF/IRHM/edema temporal fovea -- slightly improved; IN schisis caught on widefield).   Notes *Images captured and stored on drive  Diagnosis / Impression:  +DME OU + peripheral retinoschisis OU OD: Focal IRHM and trace cystic changes temporal fovea; IT schisis - stable, +vitreous opacities, partial PVD.  OS: persistent IRF/IRHM/edema temporal fovea -- slightly improved; IN schisis caught on widefield   Clinical management:  See below  Abbreviations: NFP - Normal foveal profile. CME - cystoid macular edema. PED - pigment epithelial detachment. IRF - intraretinal fluid. SRF - subretinal fluid. EZ - ellipsoid zone. ERM - epiretinal membrane. ORA - outer retinal atrophy. ORT - outer retinal tubulation. SRHM - subretinal hyper-reflective material. IRHM - intraretinal hyper-reflective material      Panretinal Photocoagulation - OD - Right Eye       Time Out Confirmed correct patient, procedure, site, and patient consented.   Anesthesia Topical anesthesia was used. Anesthetic medications included Proparacaine 0.5%.   Notes LASER PROCEDURE NOTE  Diagnosis:   Proliferative Diabetic Retinopathy, RIGHT EYE  Procedure:  Pan-retinal photocoagulation using slit lamp laser, RIGHT EYE, fill-in  Anesthesia:  Topical  Surgeon: Rennis Chris, MD, PhD  Informed consent obtained, operative eye marked, and time out performed prior to initiation of laser.   Lumenis [id number] slit lamp laser Pattern: 3x3 square Power: 340 mW Duration: 30 msec  Spot size: 200 microns  # spots: 1159 spots 360 fill-in  Complications: None.  Notes: old vitreous heme  obscuring view and preventing laser up take in scattered focal areas  RTC: 08.20.24 or later -- DFE/OCT, possible injxns  Patient tolerated the procedure well and received written and verbal post-procedure care information/education.          ASSESSMENT/PLAN:   ICD-10-CM   1. Proliferative diabetic retinopathy of both eyes with macular edema associated with type 2 diabetes mellitus (HCC)  E11.3513 OCT, Retina - OU - Both Eyes    Panretinal Photocoagulation - OD - Right Eye    2. 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 Essential hypertension  I10     5. Hypertensive retinopathy of both eyes  H35.033     6. Pseudophakia  Z96.1     7. Combined forms of age-related cataract of left eye  H25.812      1,2. Proliferative diabetic retinopathy, both eyes  - s/p IVA OS #1 (05.14.24)  - s/p IVA OD #1 (05.16.24)  - s/p IVA OU # 2 (06.25.24), #3 (07.23.24)  - s/p PRP OS (05.21.24)  - previously followed with Dr. Dorette Grate at [facility], but has been lost to f/u since 10.28.2020  - h/o PRP OU; had never received anti-VEGF injxns prior to here, per report - exam shows old white VH + some red subhyaloid heme OD; OS with focal clusters of preretinal heme turning white -- nasal and temporal periphery - FA (05.14.24) shows OD:  Clusters of perivascular leakage inferior and temporal midzone -- ?early NV; OS: Clusters of NVE temporal midzone -- pt would benefit from fill in PRP OU (OS first--done 05.21.24) - OCT shows OD: Focal IRHM and trace cystic changes temporal fovea -- slightly improved; IT schisis - stable, interval improvement in vitreous opacities; OS: persistent IRF/IRHM/edema temporal fovea -- slightly improved; IN schisis caught on widefield--slightly improved  - recommend PRP OD fill in today, 08.01.24 - pt wishes to proceed with laser - RBA of procedure discussed, questions answered - see procedure note - start PF QID OD x7 days - Avastin informed  consent obtained and signed 05.14.24 (OU) - f/u August 20th or later, DFE, OCT, possible injection(s)  3. Retinoschisis OU  - peripheral retinoschisis confirmed by OCT  - OD -- inferotemporal periphery  - OS -- nasal periphery  - monitor  4,5. Hypertensive retinopathy OU - discussed importance of tight BP control - monitor  6. Pseudophakia OD  - s/p CE/IOL OD (Dr. June Leap, 06.07.24)  - IOL in good position, doing well  - monitor  7. Mixed Cataract OS - The symptoms of cataract, surgical options, and treatments and risks were discussed with patient. - discussed diagnosis and progression - under the expert management of Dr. June Leap - clear from a retina standpoint to proceed with cataract surgery when pt and surgeon are ready  Ophthalmic Meds Ordered this visit:  No orders of the defined types were placed in this encounter.    Return for 08/06/23 or later for PDR OU, DFE, OCT, Possible Injxn(s).  There are no Patient Instructions on file for this visit.  Explained the diagnoses, plan, and follow up with the patient and they expressed understanding.  Patient expressed understanding of the importance of proper follow up care.   This document serves as a record of services personally performed by Karie Chimera, MD, PhD. It was created on their behalf by Glee Arvin. Manson Passey, OA an ophthalmic technician. The creation of this record is the provider's dictation and/or activities during the visit.    Electronically signed by: Glee Arvin. Manson Passey, OA 07/18/23 2:00 PM  Karie Chimera, M.D., Ph.D. Diseases & Surgery of the Retina and Vitreous [facility]  I have reviewed the above documentation for accuracy and completeness, and I agree with the above. Karie Chimera, M.D., Ph.D. 07/18/23 2:03 PM  Abbreviations: M myopia (nearsighted); A astigmatism; H hyperopia (farsighted); P presbyopia; Mrx spectacle prescription;  CTL contact lenses; OD right eye; OS left eye; OU  both eyes  XT exotropia; ET esotropia; PEK punctate epithelial keratitis; PEE punctate epithelial erosions; DES dry eye syndrome; MGD meibomian gland dysfunction; ATs artificial tears; PFAT's preservative free artificial tears; NSC nuclear sclerotic cataract; PSC posterior subcapsular cataract; ERM epi-retinal membrane; PVD posterior vitreous detachment; RD retinal detachment; DM diabetes mellitus; DR diabetic retinopathy; NPDR non-proliferative diabetic retinopathy; PDR proliferative diabetic retinopathy; CSME clinically significant macular edema; DME diabetic macular edema; dbh dot blot hemorrhages; CWS cotton wool spot; POAG primary open angle glaucoma; C/D cup-to-disc ratio; HVF humphrey visual field; GVF goldmann visual field; OCT optical coherence tomography; IOP intraocular pressure; BRVO Branch retinal vein occlusion; CRVO central retinal vein occlusion; CRAO central retinal artery occlusion; BRAO branch retinal artery occlusion; RT retinal tear; SB scleral buckle; PPV pars plana vitrectomy; VH Vitreous hemorrhage; PRP panretinal laser photocoagulation; IVK intravitreal kenalog; VMT vitreomacular traction; MH Macular hole;  NVD neovascularization of the disc; NVE neovascularization elsewhere; AREDS age related eye disease study;  ARMD age related macular degeneration; POAG primary open angle glaucoma; EBMD epithelial/anterior basement membrane dystrophy; ACIOL anterior chamber intraocular lens; IOL intraocular lens; PCIOL posterior chamber intraocular lens; Phaco/IOL phacoemulsification with intraocular lens placement; PRK photorefractive keratectomy; LASIK laser assisted in situ keratomileusis; HTN hypertension; DM diabetes mellitus; COPD chronic obstructive pulmonary disease

## 2023-07-11 NOTE — Progress Notes (Signed)
POST OPERATIVE OFFICE NOTE    CC:  F/u for surgery  HPI:  This is a 52 y.o. female who is s/p left arm hematoma evacuation and fistula revision on 05/26/2023 by Dr. Karin Lieu after her fistula was infiltrated.    Pt was seen on 06/28/2023 and at that time, her arm was healing nicely.  She did have some numbness and tingling but not in the hand but rather at the fistula site.  She was not having steal sx.  Her staples were removed and steri strips placed.  She was scheduled for 2 week follow up.  If incision healed, her fistula would be able to be used.   Pt returns today for follow up.  Pt states that she gets occasional numbness in her left hand.  She states there has been some oozing around the catheter since it was place.  Seems to ooze more when on HD.  She states that her arm is healing.  She states prior to the infiltration, she used to be able to feel the thrill all the time and now she can feel it but it is not like before.     Allergies  Allergen Reactions   Amlodipine Besylate Rash and Other (See Comments)    dizziness   Reglan [Metoclopramide] Other (See Comments)    hallucinations     Current Outpatient Medications  Medication Sig Dispense Refill   acetaminophen (TYLENOL) 325 MG tablet Take 650 mg by mouth every 6 (six) hours as needed for mild pain or fever.     albuterol (VENTOLIN HFA) 108 (90 Base) MCG/ACT inhaler Inhale 2 puffs into the lungs every 6 (six) hours as needed for wheezing or shortness of breath. 1 each 3   aspirin EC 81 MG tablet Take 1 tablet (81 mg total) by mouth daily. Swallow whole. 90 tablet 3   cetirizine (ZYRTEC) 10 MG tablet Take 1 tablet (10 mg total) by mouth daily. 30 tablet 3   docusate sodium (COLACE) 100 MG capsule Take 100 mg by mouth daily.     ferric citrate (AURYXIA) 1 GM 210 MG(Fe) tablet Take 420 mg by mouth 2 (two) times daily with a meal. With Breakfast & with supper     glipiZIDE (GLUCOTROL) 5 MG tablet Take 1 tablet (5 mg total) by mouth  daily before breakfast. 180 tablet 0   insulin degludec (TRESIBA FLEXTOUCH) 200 UNIT/ML FlexTouch Pen Inject 20 Units into the skin at bedtime. 15 mL 1   irbesartan (AVAPRO) 75 MG tablet Take 75 mg by mouth daily.     labetalol (NORMODYNE) 100 MG tablet Take 100 mg by mouth 2 (two) times daily.     montelukast (SINGULAIR) 10 MG tablet Take 1 tablet (10 mg total) by mouth at bedtime. 30 tablet 0   multivitamin (RENA-VIT) TABS tablet Take 1 tablet by mouth daily.     NIFEdipine (ADALAT CC) 30 MG 24 hr tablet Take 30 mg by mouth at bedtime.     nitroGLYCERIN (NITROSTAT) 0.4 MG SL tablet Place 1 tablet (0.4 mg total) under the tongue every 5 (five) minutes as needed for chest pain. 100 tablet 3   ondansetron (ZOFRAN) 4 MG tablet Take 1 tablet (4 mg total) by mouth daily as needed for nausea or vomiting. 30 tablet 1   ondansetron (ZOFRAN-ODT) 4 MG disintegrating tablet Take 1 tablet (4 mg total) by mouth every 8 (eight) hours as needed for nausea or vomiting. 60 tablet 3   oxyCODONE-acetaminophen (PERCOCET) 10-325 MG tablet  Take 1 tablet by mouth every 6 (six) hours.     OXYGEN Inhale 2 L into the lungs as needed (exertion).     pantoprazole (PROTONIX) 40 MG tablet TAKE 1 TABLET (40 MG TOTAL) BY MOUTH DAILY 30 MINUTES BEFORE BREAKFAST (Patient taking differently: Take 40 mg by mouth daily.) 90 tablet 3   rOPINIRole (REQUIP XL) 2 MG 24 hr tablet Take 2 mg by mouth at bedtime.      rosuvastatin (CRESTOR) 20 MG tablet Take 20 mg by mouth daily.     sodium chloride (OCEAN) 0.65 % SOLN nasal spray Place 1 spray into both nostrils as needed for congestion. 60 mL 0   No current facility-administered medications for this visit.     ROS:  See HPI  Physical Exam:  Today's Vitals   07/12/23 1009  BP: (!) 154/81  Pulse: 75  Resp: 14  Temp: 98 F (36.7 C)  TempSrc: Temporal  SpO2: 96%  Weight: 245 lb (111.1 kg)  Height: 5\' 3"  (1.6 m)  PainSc: 8    Body mass index is 43.4 kg/m.   Incision:   incision healing.  There is still some area over the area where the hematoma was that continues to heal.  The skin is easily pinched over the fistula in this area.   Extremities:  faintly palpable left radial pulse.  Motor and sensory are in tact. Right radial is 1+.      Assessment/Plan:  This is a 52 y.o. female who is s/p: left arm hematoma evacuation and fistula revision on 05/26/2023 by Dr. Karin Lieu after her fistula was infiltrated.    -pt seen with Dr. Karin Lieu.  She feels the left arm is healing but needs a check in a couple of weeks and if improved, ok to start using fistula and then if at that time, fistula is working well, she can be scheduled to have catheter removed.  Dr. Karin Lieu discussed with her that it probably has a little more oozing at dialysis due to the heparin used.  -discussed with her that if she feels it needs a stitch, she will call us and let us know.    Doreatha Massed, [facility] [phone]   Clinic MD:  Karin Lieu

## 2023-07-11 NOTE — Progress Notes (Signed)
  Care Coordination Note  07/11/2023 Name: Patricia [name] MRN: [id number] DOB: [birth year]  Patricia [name] is a 52 y.o. year old female who is a primary care patient of Ladon Applebaum and is actively engaged with the care management team. I reached out to [employer] by phone today to assist with re-scheduling a follow up visit with the RN Case Manager  Follow up plan: Unsuccessful telephone outreach attempt made. A HIPAA compliant phone message was left for the patient providing contact information and requesting a return call.   [facility]  Care Coordination Care Guide  Direct Dial: [phone]

## 2023-07-12 ENCOUNTER — Ambulatory Visit (INDEPENDENT_AMBULATORY_CARE_PROVIDER_SITE_OTHER): Payer: Medicare Other | Admitting: Physician Assistant

## 2023-07-12 ENCOUNTER — Encounter: Payer: Medicare Other | Admitting: *Deleted

## 2023-07-12 VITALS — BP 154/81 | HR 75 | Temp 98.0°F | Resp 14 | Ht 63.0 in | Wt 245.0 lb

## 2023-07-12 DIAGNOSIS — D509 Iron deficiency anemia, unspecified: Secondary | ICD-10-CM | POA: Diagnosis not present

## 2023-07-12 DIAGNOSIS — N186 End stage renal disease: Secondary | ICD-10-CM

## 2023-07-12 DIAGNOSIS — N250 Renal osteodystrophy: Secondary | ICD-10-CM | POA: Diagnosis not present

## 2023-07-12 DIAGNOSIS — Z992 Dependence on renal dialysis: Secondary | ICD-10-CM | POA: Diagnosis not present

## 2023-07-12 DIAGNOSIS — D631 Anemia in chronic kidney disease: Secondary | ICD-10-CM | POA: Diagnosis not present

## 2023-07-12 DIAGNOSIS — N2581 Secondary hyperparathyroidism of renal origin: Secondary | ICD-10-CM | POA: Diagnosis not present

## 2023-07-15 DIAGNOSIS — D509 Iron deficiency anemia, unspecified: Secondary | ICD-10-CM | POA: Diagnosis not present

## 2023-07-15 DIAGNOSIS — D631 Anemia in chronic kidney disease: Secondary | ICD-10-CM | POA: Diagnosis not present

## 2023-07-15 DIAGNOSIS — N2581 Secondary hyperparathyroidism of renal origin: Secondary | ICD-10-CM | POA: Diagnosis not present

## 2023-07-15 DIAGNOSIS — N250 Renal osteodystrophy: Secondary | ICD-10-CM | POA: Diagnosis not present

## 2023-07-15 DIAGNOSIS — Z992 Dependence on renal dialysis: Secondary | ICD-10-CM | POA: Diagnosis not present

## 2023-07-15 DIAGNOSIS — N186 End stage renal disease: Secondary | ICD-10-CM | POA: Diagnosis not present

## 2023-07-16 DIAGNOSIS — N250 Renal osteodystrophy: Secondary | ICD-10-CM | POA: Diagnosis not present

## 2023-07-16 DIAGNOSIS — D631 Anemia in chronic kidney disease: Secondary | ICD-10-CM | POA: Diagnosis not present

## 2023-07-16 DIAGNOSIS — N186 End stage renal disease: Secondary | ICD-10-CM | POA: Diagnosis not present

## 2023-07-16 DIAGNOSIS — N2581 Secondary hyperparathyroidism of renal origin: Secondary | ICD-10-CM | POA: Diagnosis not present

## 2023-07-16 DIAGNOSIS — D509 Iron deficiency anemia, unspecified: Secondary | ICD-10-CM | POA: Diagnosis not present

## 2023-07-16 DIAGNOSIS — Z992 Dependence on renal dialysis: Secondary | ICD-10-CM | POA: Diagnosis not present

## 2023-07-17 DIAGNOSIS — D509 Iron deficiency anemia, unspecified: Secondary | ICD-10-CM | POA: Diagnosis not present

## 2023-07-17 DIAGNOSIS — D631 Anemia in chronic kidney disease: Secondary | ICD-10-CM | POA: Diagnosis not present

## 2023-07-17 DIAGNOSIS — N186 End stage renal disease: Secondary | ICD-10-CM | POA: Diagnosis not present

## 2023-07-17 DIAGNOSIS — Z992 Dependence on renal dialysis: Secondary | ICD-10-CM | POA: Diagnosis not present

## 2023-07-17 DIAGNOSIS — N250 Renal osteodystrophy: Secondary | ICD-10-CM | POA: Diagnosis not present

## 2023-07-17 DIAGNOSIS — N2581 Secondary hyperparathyroidism of renal origin: Secondary | ICD-10-CM | POA: Diagnosis not present

## 2023-07-18 ENCOUNTER — Encounter (INDEPENDENT_AMBULATORY_CARE_PROVIDER_SITE_OTHER): Payer: Self-pay | Admitting: Ophthalmology

## 2023-07-18 ENCOUNTER — Ambulatory Visit (INDEPENDENT_AMBULATORY_CARE_PROVIDER_SITE_OTHER): Payer: Medicare Other | Admitting: Ophthalmology

## 2023-07-18 DIAGNOSIS — E113513 Type 2 diabetes mellitus with proliferative diabetic retinopathy with macular edema, bilateral: Secondary | ICD-10-CM | POA: Diagnosis not present

## 2023-07-18 DIAGNOSIS — H33103 Unspecified retinoschisis, bilateral: Secondary | ICD-10-CM

## 2023-07-18 DIAGNOSIS — H35033 Hypertensive retinopathy, bilateral: Secondary | ICD-10-CM

## 2023-07-18 DIAGNOSIS — I10 Essential (primary) hypertension: Secondary | ICD-10-CM

## 2023-07-18 DIAGNOSIS — N2581 Secondary hyperparathyroidism of renal origin: Secondary | ICD-10-CM | POA: Diagnosis not present

## 2023-07-18 DIAGNOSIS — Z992 Dependence on renal dialysis: Secondary | ICD-10-CM | POA: Diagnosis not present

## 2023-07-18 DIAGNOSIS — Z961 Presence of intraocular lens: Secondary | ICD-10-CM

## 2023-07-18 DIAGNOSIS — N250 Renal osteodystrophy: Secondary | ICD-10-CM | POA: Diagnosis not present

## 2023-07-18 DIAGNOSIS — D631 Anemia in chronic kidney disease: Secondary | ICD-10-CM | POA: Diagnosis not present

## 2023-07-18 DIAGNOSIS — N186 End stage renal disease: Secondary | ICD-10-CM | POA: Diagnosis not present

## 2023-07-18 DIAGNOSIS — Z794 Long term (current) use of insulin: Secondary | ICD-10-CM

## 2023-07-18 DIAGNOSIS — H25812 Combined forms of age-related cataract, left eye: Secondary | ICD-10-CM

## 2023-07-18 DIAGNOSIS — D509 Iron deficiency anemia, unspecified: Secondary | ICD-10-CM | POA: Diagnosis not present

## 2023-07-18 NOTE — Progress Notes (Signed)
  Care Coordination Note  07/18/2023 Name: Patricia [name] MRN: [id number] DOB: 1971-02-03  Patricia [name] is a 52 y.o. year old female who is a primary care patient of Ladon Applebaum and is actively engaged with the care management team. I reached out to [employer] by phone today to assist with re-scheduling a follow up visit with the RN Case Manager  Follow up plan: Unsuccessful telephone outreach attempt made. A HIPAA compliant phone message was left for the patient providing contact information and requesting a return call.  We have been unable to make contact with the patient for follow up. The care management team is available to follow up with the patient after provider conversation with the patient regarding recommendation for care management engagement and subsequent re-referral to the care management team.   [facility] Coordination Care Guide  Direct Dial: [phone]

## 2023-07-19 DIAGNOSIS — H1010 Acute atopic conjunctivitis, unspecified eye: Secondary | ICD-10-CM | POA: Diagnosis not present

## 2023-07-19 DIAGNOSIS — Z9981 Dependence on supplemental oxygen: Secondary | ICD-10-CM | POA: Diagnosis not present

## 2023-07-19 DIAGNOSIS — I10 Essential (primary) hypertension: Secondary | ICD-10-CM | POA: Diagnosis not present

## 2023-07-19 DIAGNOSIS — Z992 Dependence on renal dialysis: Secondary | ICD-10-CM | POA: Diagnosis not present

## 2023-07-19 DIAGNOSIS — F5101 Primary insomnia: Secondary | ICD-10-CM | POA: Diagnosis not present

## 2023-07-19 DIAGNOSIS — N250 Renal osteodystrophy: Secondary | ICD-10-CM | POA: Diagnosis not present

## 2023-07-19 DIAGNOSIS — N186 End stage renal disease: Secondary | ICD-10-CM | POA: Diagnosis not present

## 2023-07-19 DIAGNOSIS — Z6841 Body Mass Index (BMI) 40.0 and over, adult: Secondary | ICD-10-CM | POA: Diagnosis not present

## 2023-07-19 DIAGNOSIS — D631 Anemia in chronic kidney disease: Secondary | ICD-10-CM | POA: Diagnosis not present

## 2023-07-19 DIAGNOSIS — D509 Iron deficiency anemia, unspecified: Secondary | ICD-10-CM | POA: Diagnosis not present

## 2023-07-19 DIAGNOSIS — N2581 Secondary hyperparathyroidism of renal origin: Secondary | ICD-10-CM | POA: Diagnosis not present

## 2023-07-19 DIAGNOSIS — J3089 Other allergic rhinitis: Secondary | ICD-10-CM | POA: Diagnosis not present

## 2023-07-22 DIAGNOSIS — N2581 Secondary hyperparathyroidism of renal origin: Secondary | ICD-10-CM | POA: Diagnosis not present

## 2023-07-22 DIAGNOSIS — N250 Renal osteodystrophy: Secondary | ICD-10-CM | POA: Diagnosis not present

## 2023-07-22 DIAGNOSIS — D631 Anemia in chronic kidney disease: Secondary | ICD-10-CM | POA: Diagnosis not present

## 2023-07-22 DIAGNOSIS — Z992 Dependence on renal dialysis: Secondary | ICD-10-CM | POA: Diagnosis not present

## 2023-07-22 DIAGNOSIS — D509 Iron deficiency anemia, unspecified: Secondary | ICD-10-CM | POA: Diagnosis not present

## 2023-07-22 DIAGNOSIS — N186 End stage renal disease: Secondary | ICD-10-CM | POA: Diagnosis not present

## 2023-07-23 NOTE — Progress Notes (Signed)
[facility] - Clinic Note  08/06/2023   CHIEF COMPLAINT Patient presents for Retina Follow Up  HISTORY OF PRESENT ILLNESS: Patricia [name] is a 52 y.o. female who presents to the clinic today for:  HPI     Retina Follow Up   Patient presents with  Diabetic Retinopathy.  In both eyes.  This started 19 days ago.  I, the attending physician,  performed the HPI with the patient and updated documentation appropriately.        Comments   Patient here for 19 days retina follow up for PDR OU. Patient states vision is ok. Eye still feel like has grit in them. Uses eye drops. OD still sees like spots.      Last edited by Rennis Chris, MD on 08/06/2023  8:25 AM.     Pt states her eyes were a little red after the last injection   Referring physician: [name], MD [address] [place],  [place] [address]  HISTORICAL INFORMATION:  Selected notes from the MEDICAL RECORD NUMBER Referred by Dr. June Leap for macular edema LEE:  Ocular Hx- PMH-   CURRENT MEDICATIONS: No current outpatient medications on file. (Ophthalmic Drugs)   No current facility-administered medications for this visit. (Ophthalmic Drugs)   Current Outpatient Medications (Other)  Medication Sig   acetaminophen (TYLENOL) 325 MG tablet Take 650 mg by mouth every 6 (six) hours as needed for mild pain or fever.   albuterol (VENTOLIN HFA) 108 (90 Base) MCG/ACT inhaler Inhale 2 puffs into the lungs every 6 (six) hours as needed for wheezing or shortness of breath.   aspirin EC 81 MG tablet Take 1 tablet (81 mg total) by mouth daily. Swallow whole.   cetirizine (ZYRTEC) 10 MG tablet Take 1 tablet (10 mg total) by mouth daily.   docusate sodium (COLACE) 100 MG capsule Take 100 mg by mouth daily.   ferric citrate (AURYXIA) 1 GM 210 MG(Fe) tablet Take 420 mg by mouth 2 (two) times daily with a meal. With Breakfast & with supper   glipiZIDE (GLUCOTROL) 5 MG tablet Take 1 tablet (5 mg total) by mouth  daily before breakfast.   irbesartan (AVAPRO) 75 MG tablet Take 75 mg by mouth daily.   labetalol (NORMODYNE) 100 MG tablet Take 100 mg by mouth 2 (two) times daily.   montelukast (SINGULAIR) 10 MG tablet Take 1 tablet (10 mg total) by mouth at bedtime.   multivitamin (RENA-VIT) TABS tablet Take 1 tablet by mouth daily.   NIFEdipine (ADALAT CC) 30 MG 24 hr tablet Take 30 mg by mouth at bedtime.   nitroGLYCERIN (NITROSTAT) 0.4 MG SL tablet Place 1 tablet (0.4 mg total) under the tongue every 5 (five) minutes as needed for chest pain.   ondansetron (ZOFRAN) 4 MG tablet Take 1 tablet (4 mg total) by mouth daily as needed for nausea or vomiting.   ondansetron (ZOFRAN-ODT) 4 MG disintegrating tablet Take 1 tablet (4 mg total) by mouth every 8 (eight) hours as needed for nausea or vomiting.   OXYGEN Inhale 2 L into the lungs as needed (exertion).   pantoprazole (PROTONIX) 40 MG tablet TAKE 1 TABLET (40 MG TOTAL) BY MOUTH DAILY 30 MINUTES BEFORE BREAKFAST (Patient taking differently: Take 40 mg by mouth daily.)   rOPINIRole (REQUIP XL) 2 MG 24 hr tablet Take 2 mg by mouth at bedtime.    rosuvastatin (CRESTOR) 20 MG tablet Take 20 mg by mouth daily.   sodium chloride (OCEAN) 0.65 %  SOLN nasal spray Place 1 spray into both nostrils as needed for congestion.   insulin degludec (TRESIBA FLEXTOUCH) 200 UNIT/ML FlexTouch Pen Inject 20 Units into the skin at bedtime.   oxyCODONE-acetaminophen (PERCOCET) 10-325 MG tablet Take 1 tablet by mouth every 6 (six) hours. (Patient not taking: Reported on 07/26/2023)   No current facility-administered medications for this visit. (Other)   REVIEW OF SYSTEMS: ROS   Positive for: Endocrine, Eyes, Respiratory Last edited by Laddie Aquas, COA on 08/06/2023  8:05 AM.      ALLERGIES Allergies  Allergen Reactions   Amlodipine Besylate Rash and Other (See Comments)    dizziness   Reglan [Metoclopramide] Other (See Comments)    hallucinations    PAST MEDICAL  HISTORY Past Medical History:  Diagnosis Date   Anemia    Ankle fracture    Arthritis    Asthma    Blood transfusion without reported diagnosis    Breast cancer (HCC)    Cancer (HCC)    COVID    Diabetes mellitus without complication (HCC)    Dyspnea    End stage renal disease on dialysis (HCC)    M/W/F [facility] in [place]   GERD (gastroesophageal reflux disease)    Hypertension    OSA (obstructive sleep apnea)    uses CPAP sometimes   Pneumonia    PONV (postoperative nausea and vomiting)    Wears glasses    Past Surgical History:  Procedure Laterality Date   ABDOMINAL HYSTERECTOMY     APPLICATION OF WOUND VAC Left 05/26/2023   Procedure: APPLICATION OF WOUND VAC;  Surgeon: Victorino Sparrow, MD;  Location: [facility] OR;  Service: Vascular;  Laterality: Left;   AV FISTULA PLACEMENT  11/2014   at [facility] hospital   AV FISTULA REPAIR N/A 04/2021   BALLOON DILATION N/A 07/10/2016   Procedure: BALLOON DILATION;  Surgeon: West Bali, MD;  Location: [facility] SUITE;  Service: Endoscopy;  Laterality: N/A;  Pyloric dilation   BREAST LUMPECTOMY     CATARACT EXTRACTION W/PHACO Right 05/24/2023   Procedure: CATARACT EXTRACTION PHACO AND INTRAOCULAR LENS PLACEMENT (IOC);  Surgeon: [name], MD;  Location: [facility] ORS;  Service: Ophthalmology;  Laterality: Right;  CDE: 5.00   CESAREAN SECTION     CHOLECYSTECTOMY     COLONOSCOPY  02/2023   COLONOSCOPY WITH PROPOFOL N/A 09/27/2016   Dr. Jena Gauss: Internal hemorrhoids repeat colonoscopy in 10 years   COLONOSCOPY WITH PROPOFOL N/A 02/28/2023   Procedure: COLONOSCOPY WITH PROPOFOL;  Surgeon: Corbin Ade, MD;  Location: [facility];  Service: Endoscopy;  Laterality: N/A;  11:45 am, asa 3 dialysis pt   DILATION AND CURETTAGE OF UTERUS     ESOPHAGOGASTRODUODENOSCOPY N/A 07/10/2016   Dr.Fields- normal esophagus, gastric stenosis was found at the pylorus, gastritis on bx, normal examined duodenun   ESOPHAGOGASTRODUODENOSCOPY (EGD) WITH PROPOFOL N/A  11/21/2022   Procedure: ESOPHAGOGASTRODUODENOSCOPY (EGD) WITH PROPOFOL;  Surgeon: Dolores Frame, MD;  Location: [facility];  Service: Gastroenterology;  Laterality: N/A;   EXTERNAL FIXATION REMOVAL Right 10/29/2018   Procedure: REMOVAL RIGHT ANKLE BIOMET ZIMMER EXTERNAL FIXATOR, SHORT LEG CAST APPLICATION;  Surgeon: Eldred Manges, MD;  Location: [facility] OR;  Service: Orthopedics;  Laterality: Right;   INSERTION OF DIALYSIS CATHETER  05/26/2023   Procedure: INSERTION OF Left internal jugular DIALYSIS CATHETER;  Surgeon: Victorino Sparrow, MD;  Location: [facility] OR;  Service: Vascular;;   MASTECTOMY     left sided   ORIF ANKLE FRACTURE Right  10/06/2018   Procedure: OPEN REDUCTION INTERNAL FIXATION (ORIF) RIGHT ANKLE TRIMALLEOLAR;  Surgeon: Eldred Manges, MD;  Location: [facility] OR;  Service: Orthopedics;  Laterality: Right;   REVISON OF ARTERIOVENOUS FISTULA Left 05/26/2023   Procedure: REVISON OF ARTERIOVENOUS FISTULA LEFT ARM HEMATOMA WASH OUT;  Surgeon: Victorino Sparrow, MD;  Location: [facility] OR;  Service: Vascular;  Laterality: Left;   RIGHT/LEFT HEART CATH AND CORONARY ANGIOGRAPHY N/A 11/27/2022   Procedure: RIGHT/LEFT HEART CATH AND CORONARY ANGIOGRAPHY;  Surgeon: Swazil[place], Peter M, MD;  Location: [facility] INVASIVE CV LAB;  Service: Cardiovascular;  Laterality: N/A;   FAMILY HISTORY Family History  Problem Relation Age of Onset   Diabetes Mellitus II Mother    Hypertension Mother    Heart block Mother    Hypertension Sister    Hypertension Sister    Colon cancer Neg Hx    SOCIAL HISTORY Social History   Tobacco Use   Smoking status: Never   Smokeless tobacco: Never  Vaping Use   Vaping status: Never Used  Substance Use Topics   Alcohol use: No    Alcohol/week: 0.0 standard drinks of alcohol   Drug use: No       OPHTHALMIC EXAM:  Base Eye Exam     Visual Acuity (Snellen - Linear)       Right Left   Dist [place] 20/80 +1 20/40 -2   Dist ph [place] 20/70 -1 20/30 -1         Tonometry  (Tonopen, 8:03 AM)       Right Left   Pressure 21 22         Pupils       Dark Light Shape React APD   Right 3 2 Round Brisk None   Left 3 2 Round Brisk None         Visual Fields (Counting fingers)       Left Right    Full Full         Extraocular Movement       Right Left    Full, Ortho Full, Ortho         Neuro/Psych     Oriented x3: Yes   Mood/Affect: Normal         Dilation     Both eyes: 1.0% Mydriacyl, 2.5% Phenylephrine @ 8:03 AM           Slit Lamp and Fundus Exam     Slit Lamp Exam       Right Left   Lids/Lashes Dermatochalasis - upper lid Dermatochalasis - upper lid   Conjunctiva/Sclera mild melanosis mild melanosis   Cornea mild arcus, well healed cataract wound mild arcus, 1+ Punctate epithelial erosions   Anterior Chamber deep and clear deep and clear   Iris Round and reactive, No NVI Round and dilated, No NVI   Lens PC IOL in good position 2-3+ Nuclear sclerosis, 2-3+ Cortical cataract   Anterior Vitreous mild syneresis, old white VH settled inferiorly, scattered vitreous condensations, Posterior vitreous detachment mild syneresis, scattered fibrosis         Fundus Exam       Right Left   Disc Pink and Sharp, no NVD mild Pallor, Sharp rim, no NVD   C/D Ratio 0.2 0.3   Macula Blunted foveal reflex, scattered MA, focal pigment clump temporal fovea Flat, Blunted foveal reflex, central edema and exudate, scattered MA -- slightly improved   Vessels attenuated, Tortuous attenuated, mild tortuosity, mild copper wiring   Periphery Attached,  scattered PRP 360 with early fill in changes, scattered MA/DBH, peripheral schisis IT with old, white VH overlying, red boat-shaped subhyaloid heme inferiorly -- almost resolved Attached, 360 peripheral PRP with good fill in changes, scattered patches of pre-retinal heme turning white           IMAGING AND PROCEDURES  Imaging and Procedures for 08/06/2023  OCT, Retina - OU - Both Eyes        Right Eye Quality was good. Central Foveal Thickness: 277. Progression has improved. Findings include normal foveal contour, no SRF, intraretinal hyper-reflective material, intraretinal fluid (Focal IRHM and trace cystic changes temporal fovea -- slightly improved; IT schisis - stable, +vitreous opacities, partial PVD).   Left Eye Quality was good. Central Foveal Thickness: 431. Progression has been stable. Findings include no SRF, abnormal foveal contour, intraretinal hyper-reflective material, intraretinal fluid, vitreomacular adhesion (Persistent IRF/IRHM/edema temporal fovea and macula; IN schisis caught on widefield -- not imaged today).   Notes *Images captured and stored on drive  Diagnosis / Impression:  +DME OU + peripheral retinoschisis OU OD: Focal IRHM and trace cystic changes temporal fovea -- slightly improved; IT schisis - stable, +vitreous opacities, partial PVD OS: Persistent IRF/IRHM/edema temporal fovea and macula; IN schisis caught on widefield -- not imaged today  Clinical management:  See below  Abbreviations: NFP - Normal foveal profile. CME - cystoid macular edema. PED - pigment epithelial detachment. IRF - intraretinal fluid. SRF - subretinal fluid. EZ - ellipsoid zone. ERM - epiretinal membrane. ORA - outer retinal atrophy. ORT - outer retinal tubulation. SRHM - subretinal hyper-reflective material. IRHM - intraretinal hyper-reflective material      Intravitreal Injection, Pharmacologic Agent - OD - Right Eye       Time Out 08/06/2023. 8:50 AM. Confirmed correct patient, procedure, site, and patient consented.   Anesthesia Topical anesthesia was used. Anesthetic medications included Lidocaine 4%, Proparacaine 0.5%.   Procedure Preparation included 5% betadine to ocular surface, eyelid speculum. A supplied (32g) needle was used.   Injection: 1.25 mg Bevacizumab 1.25mg /0.75ml   Route: Intravitreal, Site: Right Eye   NDC: P3213405, Lot: [id number],  Expiration date: 09/01/2023   Post-op Post injection exam found visual acuity of at least counting fingers. The patient tolerated the procedure well. There were no complications. The patient received written and verbal post procedure care education. Post injection medications were not given.      Intravitreal Injection, Pharmacologic Agent - OS - Left Eye       Time Out 08/06/2023. 8:51 AM. Confirmed correct patient, procedure, site, and patient consented.   Anesthesia Topical anesthesia was used. Anesthetic medications included Lidocaine 2%, Proparacaine 0.5%.   Procedure Preparation included 5% betadine to ocular surface, eyelid speculum. A supplied (32g) needle was used.   Injection: 1.25 mg Bevacizumab 1.25mg /0.71ml   Route: Intravitreal, Site: Left Eye   NDC: 14782-956-21, Lot: [id number] , Expiration date: 08/31/2023   Post-op Post injection exam found visual acuity of at least counting fingers. The patient tolerated the procedure well. There were no complications. The patient received written and verbal post procedure care education.           ASSESSMENT/PLAN:   ICD-10-CM   1. Proliferative diabetic retinopathy of both eyes with macular edema associated with type 2 diabetes mellitus (HCC)  E11.3513 OCT, Retina - OU - Both Eyes    Intravitreal Injection, Pharmacologic Agent - OD - Right Eye    Intravitreal Injection, Pharmacologic Agent - OS - Left Eye    Bevacizumab (AVASTIN)  SOLN 1.25 mg    Bevacizumab (AVASTIN) SOLN 1.25 mg    2. 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 Essential hypertension  I10     5. Hypertensive retinopathy of both eyes  H35.033     6. Pseudophakia  Z96.1     7. Combined forms of age-related cataract of left eye  H25.812       1,2. Proliferative diabetic retinopathy, both eyes  - s/p IVA OS #1 (05.14.24)  - s/p IVA OD #1 (05.16.24)  - s/p IVA OU # 2 (06.25.24), #3 (07.23.24)  - s/p PRP OS  (05.21.24)  - s/p PRP OD (08.01.24)  - previously followed with [facility] Dorette Gr[facility], but had been lost to f/u since 10.28.2020  - h/o PRP OU; had never received anti-VEGF injxns prior to here, per report - exam shows old white VH + some red subhyaloid heme OD; OS with focal clusters of preretinal heme turning white -- nasal and temporal periphery - FA (05.14.24) shows OD: Clusters of perivascular leakage inferior and temporal midzone -- ?early NV; OS: Clusters of NVE temporal midzone -- pt would benefit from fill in PRP OU (OS first--done 05.21.24) - OCT shows OD: Focal IRHM and trace cystic changes temporal fovea -- slightly improved; IT schisis - stable, +vitreous opacities, partial PVD; OS: Persistent IRF/IRHM/edema temporal fovea and macula; IN schisis caught on widefield -- not imaged today at 4 wks - recommend IVA #4 OU today 08.20.24 w/ f/u in 4 wks - pt wishes to proceed with injections - RBA of procedure discussed, questions answered - Avastin informed consent obtained and signed 05.14.24 (OU) - see procedure note - f/u 4 weeks, DFE, OCT, possible injection(s)  3. Retinoschisis OU  - peripheral retinoschisis confirmed by OCT  - OD -- inferotemporal periphery  - OS -- nasal periphery  - stable, monitor  4,5. Hypertensive retinopathy OU - discussed importance of tight BP control - monitor  6. Pseudophakia OD  - s/p CE/IOL OD (Dr. June Leap, 06.07.24)  - IOL in good position, doing well  - monitor  7. Mixed Cataract OS - The symptoms of cataract, surgical options, and treatments and risks were discussed with patient. - discussed diagnosis and progression - under the expert management of Dr. June Leap - clear from a retina standpoint to proceed with cataract surgery when pt and surgeon are ready  Ophthalmic Meds Ordered this visit:  Meds ordered this encounter  Medications   Bevacizumab (AVASTIN) SOLN 1.25 mg   Bevacizumab (AVASTIN) SOLN 1.25 mg     Return in about 4 weeks  (around 09/03/2023) for f/u PDR OU, DFE, OCT.  There are no Patient Instructions on file for this visit.  Explained the diagnoses, plan, and follow up with the patient and they expressed understanding.  Patient expressed understanding of the importance of proper follow up care.   This document serves as a record of services personally performed by Karie Chimera, MD, PhD. It was created on their behalf by Laurey Morale, COT an ophthalmic technician. The creation of this record is the provider's dictation and/or activities during the visit.    Electronically signed by:  Charlette Caffey, COT  08/06/23 11:01 PM  This document serves as a record of services personally performed by Karie Chimera, MD, PhD. It was created on their behalf by Glee Arvin. Manson Passey, OA an ophthalmic technician. The creation of this record is the  provider's dictation and/or activities during the visit.    Electronically signed by: Glee Arvin. Manson Passey, OA 08/06/23 11:01 PM  Karie Chimera, M.D., Ph.D. Diseases & Surgery of the Retina and Vitreous [facility]  I have reviewed the above documentation for accuracy and completeness, and I agree with the above. Karie Chimera, M.D., Ph.D. 08/06/23 11:03 PM  Abbreviations: M myopia (nearsighted); A astigmatism; H hyperopia (farsighted); P presbyopia; Mrx spectacle prescription;  CTL contact lenses; OD right eye; OS left eye; OU both eyes  XT exotropia; ET esotropia; PEK punctate epithelial keratitis; PEE punctate epithelial erosions; DES dry eye syndrome; MGD meibomian gland dysfunction; ATs artificial tears; PFAT's preservative free artificial tears; NSC nuclear sclerotic cataract; PSC posterior subcapsular cataract; ERM epi-retinal membrane; PVD posterior vitreous detachment; RD retinal detachment; DM diabetes mellitus; DR diabetic retinopathy; NPDR non-proliferative diabetic retinopathy; PDR proliferative diabetic retinopathy; CSME clinically  significant macular edema; DME diabetic macular edema; dbh dot blot hemorrhages; CWS cotton wool spot; POAG primary open angle glaucoma; C/D cup-to-disc ratio; HVF humphrey visual field; GVF goldmann visual field; OCT optical coherence tomography; IOP intraocular pressure; BRVO Branch retinal vein occlusion; CRVO central retinal vein occlusion; CRAO central retinal artery occlusion; BRAO branch retinal artery occlusion; RT retinal tear; SB scleral buckle; PPV pars plana vitrectomy; VH Vitreous hemorrhage; PRP panretinal laser photocoagulation; IVK intravitreal kenalog; VMT vitreomacular traction; MH Macular hole;  NVD neovascularization of the disc; NVE neovascularization elsewhere; AREDS age related eye disease study; ARMD age related macular degeneration; POAG primary open angle glaucoma; EBMD epithelial/anterior basement membrane dystrophy; ACIOL anterior chamber intraocular lens; IOL intraocular lens; PCIOL posterior chamber intraocular lens; Phaco/IOL phacoemulsification with intraocular lens placement; PRK photorefractive keratectomy; LASIK laser assisted in situ keratomileusis; HTN hypertension; DM diabetes mellitus; COPD chronic obstructive pulmonary disease

## 2023-07-24 DIAGNOSIS — Z992 Dependence on renal dialysis: Secondary | ICD-10-CM | POA: Diagnosis not present

## 2023-07-24 DIAGNOSIS — D631 Anemia in chronic kidney disease: Secondary | ICD-10-CM | POA: Diagnosis not present

## 2023-07-24 DIAGNOSIS — N2581 Secondary hyperparathyroidism of renal origin: Secondary | ICD-10-CM | POA: Diagnosis not present

## 2023-07-24 DIAGNOSIS — N250 Renal osteodystrophy: Secondary | ICD-10-CM | POA: Diagnosis not present

## 2023-07-24 DIAGNOSIS — D509 Iron deficiency anemia, unspecified: Secondary | ICD-10-CM | POA: Diagnosis not present

## 2023-07-24 DIAGNOSIS — N186 End stage renal disease: Secondary | ICD-10-CM | POA: Diagnosis not present

## 2023-07-26 ENCOUNTER — Ambulatory Visit (INDEPENDENT_AMBULATORY_CARE_PROVIDER_SITE_OTHER): Payer: Medicare Other | Admitting: Physician Assistant

## 2023-07-26 VITALS — BP 169/94 | HR 87 | Temp 97.1°F | Resp 20 | Ht 63.0 in | Wt 249.0 lb

## 2023-07-26 DIAGNOSIS — N186 End stage renal disease: Secondary | ICD-10-CM | POA: Diagnosis not present

## 2023-07-26 DIAGNOSIS — Z992 Dependence on renal dialysis: Secondary | ICD-10-CM | POA: Diagnosis not present

## 2023-07-26 DIAGNOSIS — D631 Anemia in chronic kidney disease: Secondary | ICD-10-CM | POA: Diagnosis not present

## 2023-07-26 DIAGNOSIS — N250 Renal osteodystrophy: Secondary | ICD-10-CM | POA: Diagnosis not present

## 2023-07-26 DIAGNOSIS — N2581 Secondary hyperparathyroidism of renal origin: Secondary | ICD-10-CM | POA: Diagnosis not present

## 2023-07-26 DIAGNOSIS — D509 Iron deficiency anemia, unspecified: Secondary | ICD-10-CM | POA: Diagnosis not present

## 2023-07-26 NOTE — Progress Notes (Signed)
POST OPERATIVE OFFICE NOTE    CC:  F/u for surgery  HPI:  This is a 52 y.o. female who is s/p *** on *** by Dr. Marland Kitchen    Pt returns today for follow up.  Pt states ***   Allergies  Allergen Reactions   Amlodipine Besylate Rash and Other (See Comments)    dizziness   Reglan [Metoclopramide] Other (See Comments)    hallucinations     Current Outpatient Medications  Medication Sig Dispense Refill   acetaminophen (TYLENOL) 325 MG tablet Take 650 mg by mouth every 6 (six) hours as needed for mild pain or fever.     albuterol (VENTOLIN HFA) 108 (90 Base) MCG/ACT inhaler Inhale 2 puffs into the lungs every 6 (six) hours as needed for wheezing or shortness of breath. 1 each 3   aspirin EC 81 MG tablet Take 1 tablet (81 mg total) by mouth daily. Swallow whole. 90 tablet 3   cetirizine (ZYRTEC) 10 MG tablet Take 1 tablet (10 mg total) by mouth daily. 30 tablet 3   docusate sodium (COLACE) 100 MG capsule Take 100 mg by mouth daily.     ferric citrate (AURYXIA) 1 GM 210 MG(Fe) tablet Take 420 mg by mouth 2 (two) times daily with a meal. With Breakfast & with supper     glipiZIDE (GLUCOTROL) 5 MG tablet Take 1 tablet (5 mg total) by mouth daily before breakfast. 180 tablet 0   insulin degludec (TRESIBA FLEXTOUCH) 200 UNIT/ML FlexTouch Pen Inject 20 Units into the skin at bedtime. 15 mL 1   irbesartan (AVAPRO) 75 MG tablet Take 75 mg by mouth daily.     labetalol (NORMODYNE) 100 MG tablet Take 100 mg by mouth 2 (two) times daily.     montelukast (SINGULAIR) 10 MG tablet Take 1 tablet (10 mg total) by mouth at bedtime. 30 tablet 0   multivitamin (RENA-VIT) TABS tablet Take 1 tablet by mouth daily.     NIFEdipine (ADALAT CC) 30 MG 24 hr tablet Take 30 mg by mouth at bedtime.     nitroGLYCERIN (NITROSTAT) 0.4 MG SL tablet Place 1 tablet (0.4 mg total) under the tongue every 5 (five) minutes as needed for chest pain. 100 tablet 3   ondansetron (ZOFRAN) 4 MG tablet Take 1 tablet (4 mg total) by  mouth daily as needed for nausea or vomiting. 30 tablet 1   ondansetron (ZOFRAN-ODT) 4 MG disintegrating tablet Take 1 tablet (4 mg total) by mouth every 8 (eight) hours as needed for nausea or vomiting. 60 tablet 3   oxyCODONE-acetaminophen (PERCOCET) 10-325 MG tablet Take 1 tablet by mouth every 6 (six) hours. (Patient not taking: Reported on 07/26/2023)     OXYGEN Inhale 2 L into the lungs as needed (exertion).     pantoprazole (PROTONIX) 40 MG tablet TAKE 1 TABLET (40 MG TOTAL) BY MOUTH DAILY 30 MINUTES BEFORE BREAKFAST (Patient taking differently: Take 40 mg by mouth daily.) 90 tablet 3   rOPINIRole (REQUIP XL) 2 MG 24 hr tablet Take 2 mg by mouth at bedtime.      rosuvastatin (CRESTOR) 20 MG tablet Take 20 mg by mouth daily.     sodium chloride (OCEAN) 0.65 % SOLN nasal spray Place 1 spray into both nostrils as needed for congestion. 60 mL 0   No current facility-administered medications for this visit.     ROS:  See HPI  Physical Exam:  ***  Incision:  *** Extremities:  *** Neuro: *** Abdomen:  ***  Assessment/Plan:  This is a 52 y.o. female who is s/p: ***  -Can use fistula again. If working well [facility] can be removed   Loel Dubonnet, PA-C [facility] [phone]   Clinic MD:  ***

## 2023-07-26 NOTE — H&P (View-Only) (Signed)
POST OPERATIVE OFFICE NOTE    CC:  F/u for surgery  HPI:  Patricia [name] is a 52 y.o. female who is s/p left arm hematoma evacuation and fistula revision on 05/26/2023 by Dr. Karin Lieu.  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  She was last seen by our office on 07/12/2023 and her arm was healing well.  She still had some swelling of her left upper arm, but this was greatly improved.  She returns today for follow-up and final incision check.  Her left upper arm incision is now well-healed.  She is dialyzing with her left IJ TDC.   Allergies  Allergen Reactions   Amlodipine Besylate Rash and Other (See Comments)    dizziness   Reglan [Metoclopramide] Other (See Comments)    hallucinations     Current Outpatient Medications  Medication Sig Dispense Refill   acetaminophen (TYLENOL) 325 MG tablet Take 650 mg by mouth every 6 (six) hours as needed for mild pain or fever.     albuterol (VENTOLIN HFA) 108 (90 Base) MCG/ACT inhaler Inhale 2 puffs into the lungs every 6 (six) hours as needed for wheezing or shortness of breath. 1 each 3   aspirin EC 81 MG tablet Take 1 tablet (81 mg total) by mouth daily. Swallow whole. 90 tablet 3   cetirizine (ZYRTEC) 10 MG tablet Take 1 tablet (10 mg total) by mouth daily. 30 tablet 3   docusate sodium (COLACE) 100 MG capsule Take 100 mg by mouth daily.     ferric citrate (AURYXIA) 1 GM 210 MG(Fe) tablet Take 420 mg by mouth 2 (two) times daily with a meal. With Breakfast & with supper     glipiZIDE (GLUCOTROL) 5 MG tablet Take 1 tablet (5 mg total) by mouth daily before breakfast. 180 tablet 0   insulin degludec (TRESIBA FLEXTOUCH) 200 UNIT/ML FlexTouch Pen Inject 20 Units into the skin at bedtime. 15 mL 1   irbesartan (AVAPRO) 75 MG tablet Take 75 mg by mouth daily.     labetalol (NORMODYNE) 100 MG tablet Take 100 mg by mouth 2 (two) times daily.     montelukast (SINGULAIR) 10 MG tablet Take 1 tablet (10 mg total) by mouth at bedtime.  30 tablet 0   multivitamin (RENA-VIT) TABS tablet Take 1 tablet by mouth daily.     NIFEdipine (ADALAT CC) 30 MG 24 hr tablet Take 30 mg by mouth at bedtime.     nitroGLYCERIN (NITROSTAT) 0.4 MG SL tablet Place 1 tablet (0.4 mg total) under the tongue every 5 (five) minutes as needed for chest pain. 100 tablet 3   ondansetron (ZOFRAN) 4 MG tablet Take 1 tablet (4 mg total) by mouth daily as needed for nausea or vomiting. 30 tablet 1   ondansetron (ZOFRAN-ODT) 4 MG disintegrating tablet Take 1 tablet (4 mg total) by mouth every 8 (eight) hours as needed for nausea or vomiting. 60 tablet 3   oxyCODONE-acetaminophen (PERCOCET) 10-325 MG tablet Take 1 tablet by mouth every 6 (six) hours. (Patient not taking: Reported on 07/26/2023)     OXYGEN Inhale 2 L into the lungs as needed (exertion).     pantoprazole (PROTONIX) 40 MG tablet TAKE 1 TABLET (40 MG TOTAL) BY MOUTH DAILY 30 MINUTES BEFORE BREAKFAST (Patient taking differently: Take 40 mg by mouth daily.) 90 tablet 3   rOPINIRole (REQUIP XL) 2 MG 24 hr tablet Take 2 mg by mouth at bedtime.      rosuvastatin (CRESTOR) 20 MG  tablet Take 20 mg by mouth daily.     sodium chloride (OCEAN) 0.65 % SOLN nasal spray Place 1 spray into both nostrils as needed for congestion. 60 mL 0   No current facility-administered medications for this visit.     ROS:  See HPI  Physical Exam:  Incision:  left upper arm incision well healed. No wounds overlying the fistula and the skin can be easily mobilized Extremities:  left upper arm fistula with great thrill on palpation and slight pulsatility      Assessment/Plan:  This is a 52 y.o. female who is here for final incision check  -Her left arm incision is now fully healed after hematoma evacuation and fistula revision -There are no wounds overlying the fistula and the skin is easily mobilized -On exam the fistula has a great thrill and is slightly pulsatile -She is clear to use her fistula again. If this works  well for 2-3 sessions, her [facility] can be removed. -She can follow up with our office as needed   Loel Dubonnet, PA-C [facility] [phone]   Clinic MD:  Karin Lieu

## 2023-07-29 DIAGNOSIS — N186 End stage renal disease: Secondary | ICD-10-CM | POA: Diagnosis not present

## 2023-07-29 DIAGNOSIS — D631 Anemia in chronic kidney disease: Secondary | ICD-10-CM | POA: Diagnosis not present

## 2023-07-29 DIAGNOSIS — N250 Renal osteodystrophy: Secondary | ICD-10-CM | POA: Diagnosis not present

## 2023-07-29 DIAGNOSIS — Z992 Dependence on renal dialysis: Secondary | ICD-10-CM | POA: Diagnosis not present

## 2023-07-29 DIAGNOSIS — N2581 Secondary hyperparathyroidism of renal origin: Secondary | ICD-10-CM | POA: Diagnosis not present

## 2023-07-29 DIAGNOSIS — D509 Iron deficiency anemia, unspecified: Secondary | ICD-10-CM | POA: Diagnosis not present

## 2023-07-31 DIAGNOSIS — N250 Renal osteodystrophy: Secondary | ICD-10-CM | POA: Diagnosis not present

## 2023-07-31 DIAGNOSIS — N186 End stage renal disease: Secondary | ICD-10-CM | POA: Diagnosis not present

## 2023-07-31 DIAGNOSIS — N2581 Secondary hyperparathyroidism of renal origin: Secondary | ICD-10-CM | POA: Diagnosis not present

## 2023-07-31 DIAGNOSIS — Z992 Dependence on renal dialysis: Secondary | ICD-10-CM | POA: Diagnosis not present

## 2023-07-31 DIAGNOSIS — D631 Anemia in chronic kidney disease: Secondary | ICD-10-CM | POA: Diagnosis not present

## 2023-07-31 DIAGNOSIS — D509 Iron deficiency anemia, unspecified: Secondary | ICD-10-CM | POA: Diagnosis not present

## 2023-08-02 DIAGNOSIS — Z992 Dependence on renal dialysis: Secondary | ICD-10-CM | POA: Diagnosis not present

## 2023-08-02 DIAGNOSIS — D509 Iron deficiency anemia, unspecified: Secondary | ICD-10-CM | POA: Diagnosis not present

## 2023-08-02 DIAGNOSIS — N2581 Secondary hyperparathyroidism of renal origin: Secondary | ICD-10-CM | POA: Diagnosis not present

## 2023-08-02 DIAGNOSIS — N250 Renal osteodystrophy: Secondary | ICD-10-CM | POA: Diagnosis not present

## 2023-08-02 DIAGNOSIS — D631 Anemia in chronic kidney disease: Secondary | ICD-10-CM | POA: Diagnosis not present

## 2023-08-02 DIAGNOSIS — N186 End stage renal disease: Secondary | ICD-10-CM | POA: Diagnosis not present

## 2023-08-04 ENCOUNTER — Other Ambulatory Visit: Payer: Self-pay | Admitting: "Endocrinology

## 2023-08-04 DIAGNOSIS — E1122 Type 2 diabetes mellitus with diabetic chronic kidney disease: Secondary | ICD-10-CM

## 2023-08-05 DIAGNOSIS — D509 Iron deficiency anemia, unspecified: Secondary | ICD-10-CM | POA: Diagnosis not present

## 2023-08-05 DIAGNOSIS — Z992 Dependence on renal dialysis: Secondary | ICD-10-CM | POA: Diagnosis not present

## 2023-08-05 DIAGNOSIS — N2581 Secondary hyperparathyroidism of renal origin: Secondary | ICD-10-CM | POA: Diagnosis not present

## 2023-08-05 DIAGNOSIS — N186 End stage renal disease: Secondary | ICD-10-CM | POA: Diagnosis not present

## 2023-08-05 DIAGNOSIS — N250 Renal osteodystrophy: Secondary | ICD-10-CM | POA: Diagnosis not present

## 2023-08-05 DIAGNOSIS — D631 Anemia in chronic kidney disease: Secondary | ICD-10-CM | POA: Diagnosis not present

## 2023-08-06 ENCOUNTER — Ambulatory Visit (INDEPENDENT_AMBULATORY_CARE_PROVIDER_SITE_OTHER): Payer: Medicare Other | Admitting: Ophthalmology

## 2023-08-06 ENCOUNTER — Encounter (INDEPENDENT_AMBULATORY_CARE_PROVIDER_SITE_OTHER): Payer: Self-pay | Admitting: Ophthalmology

## 2023-08-06 DIAGNOSIS — E113513 Type 2 diabetes mellitus with proliferative diabetic retinopathy with macular edema, bilateral: Secondary | ICD-10-CM | POA: Diagnosis not present

## 2023-08-06 DIAGNOSIS — H25812 Combined forms of age-related cataract, left eye: Secondary | ICD-10-CM | POA: Diagnosis not present

## 2023-08-06 DIAGNOSIS — H35033 Hypertensive retinopathy, bilateral: Secondary | ICD-10-CM | POA: Diagnosis not present

## 2023-08-06 DIAGNOSIS — Z794 Long term (current) use of insulin: Secondary | ICD-10-CM

## 2023-08-06 DIAGNOSIS — H33103 Unspecified retinoschisis, bilateral: Secondary | ICD-10-CM | POA: Diagnosis not present

## 2023-08-06 DIAGNOSIS — I10 Essential (primary) hypertension: Secondary | ICD-10-CM | POA: Diagnosis not present

## 2023-08-06 DIAGNOSIS — Z961 Presence of intraocular lens: Secondary | ICD-10-CM

## 2023-08-06 MED ORDER — BEVACIZUMAB CHEMO INJECTION 1.25MG/0.05ML SYRINGE FOR KALEIDOSCOPE
1.2500 mg | INTRAVITREAL | Status: AC | PRN
Start: 2023-08-06 — End: 2023-08-06
  Administered 2023-08-06: 1.25 mg via INTRAVITREAL

## 2023-08-07 ENCOUNTER — Other Ambulatory Visit: Payer: Self-pay

## 2023-08-07 ENCOUNTER — Telehealth: Payer: Self-pay

## 2023-08-07 DIAGNOSIS — D509 Iron deficiency anemia, unspecified: Secondary | ICD-10-CM | POA: Diagnosis not present

## 2023-08-07 DIAGNOSIS — N186 End stage renal disease: Secondary | ICD-10-CM | POA: Diagnosis not present

## 2023-08-07 DIAGNOSIS — N250 Renal osteodystrophy: Secondary | ICD-10-CM | POA: Diagnosis not present

## 2023-08-07 DIAGNOSIS — Z992 Dependence on renal dialysis: Secondary | ICD-10-CM | POA: Diagnosis not present

## 2023-08-07 DIAGNOSIS — D631 Anemia in chronic kidney disease: Secondary | ICD-10-CM | POA: Diagnosis not present

## 2023-08-07 DIAGNOSIS — N2581 Secondary hyperparathyroidism of renal origin: Secondary | ICD-10-CM | POA: Diagnosis not present

## 2023-08-07 MED ORDER — SODIUM CHLORIDE 0.9 % IV SOLN: 250.0000 mL | INTRAVENOUS | Status: DC | PRN

## 2023-08-07 MED ORDER — SODIUM CHLORIDE 0.9% FLUSH: 3.0000 mL | Freq: Two times a day (BID) | INTRAVENOUS | Status: DC

## 2023-08-07 NOTE — Telephone Encounter (Signed)
Attempted to reach patient to schedule fistulogram. Left VM for patient to return call.  

## 2023-08-07 NOTE — Telephone Encounter (Addendum)
Patient returned call. Scheduled fistulogram for 8/27 per patient request. Instructions provided and patient voiced understanding.

## 2023-08-07 NOTE — Telephone Encounter (Signed)
Received call from Royann Shivers, PA at dialysis center- reported " having difficulty cannulating venous end of access, arterial end is ok" no bruit but has a thrill - she states she spoke with a PA here a few days ago but could not remember the name - s/w McKenzi PA in office advised scheduling pt for fistulogram with Dr. Karin Lieu.

## 2023-08-09 DIAGNOSIS — D631 Anemia in chronic kidney disease: Secondary | ICD-10-CM | POA: Diagnosis not present

## 2023-08-09 DIAGNOSIS — D509 Iron deficiency anemia, unspecified: Secondary | ICD-10-CM | POA: Diagnosis not present

## 2023-08-09 DIAGNOSIS — Z992 Dependence on renal dialysis: Secondary | ICD-10-CM | POA: Diagnosis not present

## 2023-08-09 DIAGNOSIS — N186 End stage renal disease: Secondary | ICD-10-CM | POA: Diagnosis not present

## 2023-08-09 DIAGNOSIS — N2581 Secondary hyperparathyroidism of renal origin: Secondary | ICD-10-CM | POA: Diagnosis not present

## 2023-08-09 DIAGNOSIS — N250 Renal osteodystrophy: Secondary | ICD-10-CM | POA: Diagnosis not present

## 2023-08-12 DIAGNOSIS — D509 Iron deficiency anemia, unspecified: Secondary | ICD-10-CM | POA: Diagnosis not present

## 2023-08-12 DIAGNOSIS — N250 Renal osteodystrophy: Secondary | ICD-10-CM | POA: Diagnosis not present

## 2023-08-12 DIAGNOSIS — N186 End stage renal disease: Secondary | ICD-10-CM | POA: Diagnosis not present

## 2023-08-12 DIAGNOSIS — D631 Anemia in chronic kidney disease: Secondary | ICD-10-CM | POA: Diagnosis not present

## 2023-08-12 DIAGNOSIS — N2581 Secondary hyperparathyroidism of renal origin: Secondary | ICD-10-CM | POA: Diagnosis not present

## 2023-08-12 DIAGNOSIS — Z992 Dependence on renal dialysis: Secondary | ICD-10-CM | POA: Diagnosis not present

## 2023-08-13 ENCOUNTER — Other Ambulatory Visit: Payer: Self-pay

## 2023-08-13 ENCOUNTER — Ambulatory Visit (HOSPITAL_COMMUNITY)
Admission: RE | Admit: 2023-08-13 | Discharge: 2023-08-13 | Disposition: A | Discharge status: Home / Self Care | Payer: Medicare Other | Source: Home / Self Care | Attending: Surgery | Admitting: Surgery

## 2023-08-13 ENCOUNTER — Encounter (HOSPITAL_COMMUNITY)
Admission: RE | Disposition: A | Discharge status: Home / Self Care | Payer: Self-pay | Source: Home / Self Care | Attending: Surgery

## 2023-08-13 DIAGNOSIS — Y832 Surgical operation with anastomosis, bypass or graft as the cause of abnormal reaction of the patient, or of later complication, without mention of misadventure at the time of the procedure: Secondary | ICD-10-CM | POA: Insufficient documentation

## 2023-08-13 DIAGNOSIS — N186 End stage renal disease: Secondary | ICD-10-CM | POA: Diagnosis not present

## 2023-08-13 DIAGNOSIS — Z992 Dependence on renal dialysis: Secondary | ICD-10-CM | POA: Diagnosis not present

## 2023-08-13 DIAGNOSIS — T82858A Stenosis of vascular prosthetic devices, implants and grafts, initial encounter: Secondary | ICD-10-CM | POA: Insufficient documentation

## 2023-08-13 DIAGNOSIS — T82898A Other specified complication of vascular prosthetic devices, implants and grafts, initial encounter: Secondary | ICD-10-CM | POA: Diagnosis not present

## 2023-08-13 HISTORY — PX: PERIPHERAL VASCULAR BALLOON ANGIOPLASTY: CATH118281

## 2023-08-13 HISTORY — PX: A/V FISTULAGRAM: CATH118298

## 2023-08-13 LAB — BASIC METABOLIC PANEL
Anion gap: 14 (ref 5–15)
BUN: 35 mg/dL — ABNORMAL HIGH (ref 6–20)
CO2: 25 mmol/L (ref 22–32)
Calcium: 10 mg/dL (ref 8.9–10.3)
Chloride: 98 mmol/L (ref 98–111)
Creatinine, Ser: 9.51 mg/dL — ABNORMAL HIGH (ref 0.44–1.00)
GFR, Estimated: 5 mL/min — ABNORMAL LOW (ref >60)
Glucose, Bld: 238 mg/dL — ABNORMAL HIGH (ref 70–99)
Potassium: 3.9 mmol/L (ref 3.5–5.1)
Sodium: 137 mmol/L (ref 135–145)

## 2023-08-13 LAB — POCT I-STAT, CHEM 8
BUN: 55 mg/dL — ABNORMAL HIGH (ref 6–20)
BUN: 58 mg/dL — ABNORMAL HIGH (ref 6–20)
Calcium, Ion: 0.97 mmol/L — ABNORMAL LOW (ref 1.15–1.40)
Calcium, Ion: 1.17 mmol/L (ref 1.15–1.40)
Chloride: 101 mmol/L (ref 98–111)
Chloride: 104 mmol/L (ref 98–111)
Creatinine, Ser: 10.2 mg/dL — ABNORMAL HIGH (ref 0.44–1.00)
Creatinine, Ser: 10.2 mg/dL — ABNORMAL HIGH (ref 0.44–1.00)
Glucose, Bld: 252 mg/dL — ABNORMAL HIGH (ref 70–99)
Glucose, Bld: 252 mg/dL — ABNORMAL HIGH (ref 70–99)
HCT: 32 % — ABNORMAL LOW (ref 36.0–46.0)
HCT: 34 % — ABNORMAL LOW (ref 36.0–46.0)
Hemoglobin: 10.9 g/dL — ABNORMAL LOW (ref 12.0–15.0)
Hemoglobin: 11.6 g/dL — ABNORMAL LOW (ref 12.0–15.0)
Potassium: 6.6 mmol/L (ref 3.5–5.1)
Potassium: 7.2 mmol/L (ref 3.5–5.1)
Sodium: 132 mmol/L — ABNORMAL LOW (ref 135–145)
Sodium: 133 mmol/L — ABNORMAL LOW (ref 135–145)
TCO2: 22 mmol/L (ref 22–32)
TCO2: 25 mmol/L (ref 22–32)

## 2023-08-13 SURGERY — A/V FISTULAGRAM
Anesthesia: LOCAL | Laterality: Left

## 2023-08-13 MED ORDER — HEPARIN (PORCINE) IN NACL 1000-0.9 UT/500ML-% IV SOLN
INTRAVENOUS | Status: DC | PRN
  Administered 2023-08-13: 500 mL

## 2023-08-13 MED ORDER — MIDAZOLAM HCL 2 MG/2ML IJ SOLN
INTRAMUSCULAR | Status: DC | PRN
  Administered 2023-08-13: 1 mg via INTRAVENOUS

## 2023-08-13 MED ORDER — SODIUM CHLORIDE 0.9% FLUSH: 3.0000 mL | INTRAVENOUS | Status: DC | PRN

## 2023-08-13 MED ORDER — LIDOCAINE HCL (PF) 1 % IJ SOLN
INTRAMUSCULAR | Status: DC | PRN
  Administered 2023-08-13: 2 mL

## 2023-08-13 MED ORDER — FENTANYL CITRATE (PF) 100 MCG/2ML IJ SOLN
INTRAMUSCULAR | Status: AC
  Filled 2023-08-13: qty 2

## 2023-08-13 MED ORDER — FENTANYL CITRATE (PF) 100 MCG/2ML IJ SOLN
INTRAMUSCULAR | Status: DC | PRN
  Administered 2023-08-13: 25 ug via INTRAVENOUS

## 2023-08-13 MED ORDER — MIDAZOLAM HCL 2 MG/2ML IJ SOLN
INTRAMUSCULAR | Status: AC
  Filled 2023-08-13: qty 2

## 2023-08-13 MED ORDER — LIDOCAINE HCL (PF) 1 % IJ SOLN
INTRAMUSCULAR | Status: AC
  Filled 2023-08-13: qty 30

## 2023-08-13 MED ORDER — IODIXANOL 320 MG/ML IV SOLN
INTRAVENOUS | Status: DC | PRN
  Administered 2023-08-13: 50 mL via INTRAVENOUS

## 2023-08-13 SURGICAL SUPPLY — 16 items
BALLN STERLING OTW 6X60X135 (BALLOONS) ×2
BALLOON STERLING OTW 6X60X135 (BALLOONS) IMPLANT
CATH ANGIO 5F BER2 65CM (CATHETERS) IMPLANT
COVER DOME SNAP 22 D (MISCELLANEOUS) ×2 IMPLANT
DCB RANGER 7.0X100 135 (BALLOONS) IMPLANT
GLIDEWIRE ADV .035X180CM (WIRE) IMPLANT
KIT ENCORE 26 ADVANTAGE (KITS) IMPLANT
KIT MICROPUNCTURE NIT STIFF (SHEATH) IMPLANT
RANGER DCB 7.0X100 135 (BALLOONS) ×2
SET ATX-X65L (MISCELLANEOUS) IMPLANT
SHEATH PINNACLE R/O II 6F 4CM (SHEATH) IMPLANT
SHEATH PROBE COVER 6X72 (BAG) ×2 IMPLANT
STOPCOCK MORSE 400PSI 3WAY (MISCELLANEOUS) ×2 IMPLANT
TRAY PV CATH (CUSTOM PROCEDURE TRAY) ×2 IMPLANT
TUBING CIL FLEX 10 FLL-RA (TUBING) ×2 IMPLANT
WIRE SPARTACORE .014X300CM (WIRE) IMPLANT

## 2023-08-13 NOTE — Op Note (Signed)
    Patient name: Jobeth Furtaw MRN: [id number] DOB: 1971/10/11 Sex: female  08/13/2023 Pre-operative Diagnosis: Poorly functioning fistula Post-operative diagnosis:  Same Surgeon:  [name] Procedure Performed:  1.  Ultrasound-guided access, left cephalic  2.  Fistulogram  3.  Drug-coated balloon venoplasty, left cephalic vein  4.  Conscious sedation, 30 minutes   Indications: This is a 52 year old female who had a fistula created many years ago at [facility].  She recently had a tunneled and is now having trouble with access.  She comes in for fistulogram  Procedure:  The patient was identified in the holding area and taken to room 8.  The patient was then placed supine on the table and prepped and draped in the usual sterile fashion.  A time out was called.  Conscious sedation was administered with the use of IV fentanyl and Versed under continuous physician and nurse monitoring.  Heart rate, blood pressure, and oxygen saturations were continuously monitored.  Total sedation time was 38 minutes ultrasound was used to evaluate the fistula.  The vein was patent and compressible.  A digital ultrasound image was acquired.  The fistula was then accessed under ultrasound guidance using a micropuncture needle.  An 018 wire was then asvanced without resistance and a micropuncture sheath was placed.  Contrast injections were then performed through the sheath.  Findings: Central venous system was widely patent.  The arterial venous anastomosis is widely patent.  The fistula in the arm is tortuous with slight aneurysmal changes.  The cephalic vein junction was associated with a relatively Wager segment stenosis with some areas greater than 80%.   Intervention: The above images were acquired the decision.  I had to use a Glidewire advantage to navigate with which velocity of the fistula.  A 6 French sheath was placed.  A Berenstein 2 catheter was advanced into the innominate vein and I switched out for a 014  [place] core wire.  I then selected a 7 x 100 drug-coated Ranger balloon.  I was only able to advance its halfway down the cephalic vein junction to the central venous system.  I performed balloon angioplasty at this level for 3 minutes.  I then remove this balloon and used a 6 x 60 Sterling to treat the entire segment of the deltoid and the cephalic vein.  Completion imaging showed no residual stenosis at this level.  Catheters and wires were removed.  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  This was treated with a 7 mm Ranger drug-coated balloon as well as a 6 mm Sterling balloon    V. [name], M.D., [facility] [place] Office: [phone] Pager:  [phone]

## 2023-08-13 NOTE — Interval H&P Note (Signed)
History and Physical Interval Note:  08/13/2023 8:07 AM  Patricia [name]  has presented today for surgery, with the diagnosis of malfunction of av fistula.  The various methods of treatment have been discussed with the patient and family. After consideration of risks, benefits and other options for treatment, the patient has consented to  Procedure(s): A/V Fistulagram (Left) as a surgical intervention.  The patient's history has been reviewed, patient examined, no change in status, stable for surgery.  I have reviewed the patient's chart and labs.  Questions were answered to the patient's satisfaction.     [name]

## 2023-08-14 ENCOUNTER — Encounter (HOSPITAL_COMMUNITY): Payer: Self-pay | Admitting: Surgery

## 2023-08-14 DIAGNOSIS — N2581 Secondary hyperparathyroidism of renal origin: Secondary | ICD-10-CM | POA: Diagnosis not present

## 2023-08-14 DIAGNOSIS — N186 End stage renal disease: Secondary | ICD-10-CM | POA: Diagnosis not present

## 2023-08-14 DIAGNOSIS — Z992 Dependence on renal dialysis: Secondary | ICD-10-CM | POA: Diagnosis not present

## 2023-08-14 DIAGNOSIS — N250 Renal osteodystrophy: Secondary | ICD-10-CM | POA: Diagnosis not present

## 2023-08-14 DIAGNOSIS — D509 Iron deficiency anemia, unspecified: Secondary | ICD-10-CM | POA: Diagnosis not present

## 2023-08-14 DIAGNOSIS — D631 Anemia in chronic kidney disease: Secondary | ICD-10-CM | POA: Diagnosis not present

## 2023-08-15 DIAGNOSIS — N250 Renal osteodystrophy: Secondary | ICD-10-CM | POA: Diagnosis not present

## 2023-08-15 DIAGNOSIS — D631 Anemia in chronic kidney disease: Secondary | ICD-10-CM | POA: Diagnosis not present

## 2023-08-15 DIAGNOSIS — N2581 Secondary hyperparathyroidism of renal origin: Secondary | ICD-10-CM | POA: Diagnosis not present

## 2023-08-15 DIAGNOSIS — N186 End stage renal disease: Secondary | ICD-10-CM | POA: Diagnosis not present

## 2023-08-15 DIAGNOSIS — D509 Iron deficiency anemia, unspecified: Secondary | ICD-10-CM | POA: Diagnosis not present

## 2023-08-15 DIAGNOSIS — Z992 Dependence on renal dialysis: Secondary | ICD-10-CM | POA: Diagnosis not present

## 2023-08-16 ENCOUNTER — Telehealth: Payer: Self-pay

## 2023-08-16 DIAGNOSIS — D631 Anemia in chronic kidney disease: Secondary | ICD-10-CM | POA: Diagnosis not present

## 2023-08-16 DIAGNOSIS — Z992 Dependence on renal dialysis: Secondary | ICD-10-CM | POA: Diagnosis not present

## 2023-08-16 DIAGNOSIS — D509 Iron deficiency anemia, unspecified: Secondary | ICD-10-CM | POA: Diagnosis not present

## 2023-08-16 DIAGNOSIS — N186 End stage renal disease: Secondary | ICD-10-CM | POA: Diagnosis not present

## 2023-08-16 DIAGNOSIS — N2581 Secondary hyperparathyroidism of renal origin: Secondary | ICD-10-CM | POA: Diagnosis not present

## 2023-08-16 DIAGNOSIS — N250 Renal osteodystrophy: Secondary | ICD-10-CM | POA: Diagnosis not present

## 2023-08-16 NOTE — Telephone Encounter (Signed)
Royann Shivers, NP at [facility] called requesting a return call.  Reviewed pt's chart, returned call for clarification, two identifiers used. She stated that there was no information regarding when HD could begin cannulation again. In-depth review of chart was unsuccessful.  Spoke to [place], [place] who advised HD center could start cannulation at the next dialysis day.  Called [facility], no answer, lf detailed vm.

## 2023-08-17 DIAGNOSIS — Z992 Dependence on renal dialysis: Secondary | ICD-10-CM | POA: Diagnosis not present

## 2023-08-17 DIAGNOSIS — N186 End stage renal disease: Secondary | ICD-10-CM | POA: Diagnosis not present

## 2023-08-18 DIAGNOSIS — Z992 Dependence on renal dialysis: Secondary | ICD-10-CM | POA: Diagnosis not present

## 2023-08-18 DIAGNOSIS — N250 Renal osteodystrophy: Secondary | ICD-10-CM | POA: Diagnosis not present

## 2023-08-18 DIAGNOSIS — N2581 Secondary hyperparathyroidism of renal origin: Secondary | ICD-10-CM | POA: Diagnosis not present

## 2023-08-18 DIAGNOSIS — N186 End stage renal disease: Secondary | ICD-10-CM | POA: Diagnosis not present

## 2023-08-18 DIAGNOSIS — D509 Iron deficiency anemia, unspecified: Secondary | ICD-10-CM | POA: Diagnosis not present

## 2023-08-18 DIAGNOSIS — D631 Anemia in chronic kidney disease: Secondary | ICD-10-CM | POA: Diagnosis not present

## 2023-08-19 DIAGNOSIS — N250 Renal osteodystrophy: Secondary | ICD-10-CM | POA: Diagnosis not present

## 2023-08-19 DIAGNOSIS — D631 Anemia in chronic kidney disease: Secondary | ICD-10-CM | POA: Diagnosis not present

## 2023-08-19 DIAGNOSIS — N2581 Secondary hyperparathyroidism of renal origin: Secondary | ICD-10-CM | POA: Diagnosis not present

## 2023-08-19 DIAGNOSIS — N186 End stage renal disease: Secondary | ICD-10-CM | POA: Diagnosis not present

## 2023-08-19 DIAGNOSIS — Z992 Dependence on renal dialysis: Secondary | ICD-10-CM | POA: Diagnosis not present

## 2023-08-19 DIAGNOSIS — D509 Iron deficiency anemia, unspecified: Secondary | ICD-10-CM | POA: Diagnosis not present

## 2023-08-21 DIAGNOSIS — D631 Anemia in chronic kidney disease: Secondary | ICD-10-CM | POA: Diagnosis not present

## 2023-08-21 DIAGNOSIS — N2581 Secondary hyperparathyroidism of renal origin: Secondary | ICD-10-CM | POA: Diagnosis not present

## 2023-08-21 DIAGNOSIS — N250 Renal osteodystrophy: Secondary | ICD-10-CM | POA: Diagnosis not present

## 2023-08-21 DIAGNOSIS — Z992 Dependence on renal dialysis: Secondary | ICD-10-CM | POA: Diagnosis not present

## 2023-08-21 DIAGNOSIS — D509 Iron deficiency anemia, unspecified: Secondary | ICD-10-CM | POA: Diagnosis not present

## 2023-08-21 DIAGNOSIS — N186 End stage renal disease: Secondary | ICD-10-CM | POA: Diagnosis not present

## 2023-08-23 DIAGNOSIS — Z992 Dependence on renal dialysis: Secondary | ICD-10-CM | POA: Diagnosis not present

## 2023-08-23 DIAGNOSIS — D509 Iron deficiency anemia, unspecified: Secondary | ICD-10-CM | POA: Diagnosis not present

## 2023-08-23 DIAGNOSIS — N186 End stage renal disease: Secondary | ICD-10-CM | POA: Diagnosis not present

## 2023-08-23 DIAGNOSIS — N250 Renal osteodystrophy: Secondary | ICD-10-CM | POA: Diagnosis not present

## 2023-08-23 DIAGNOSIS — N2581 Secondary hyperparathyroidism of renal origin: Secondary | ICD-10-CM | POA: Diagnosis not present

## 2023-08-23 DIAGNOSIS — D631 Anemia in chronic kidney disease: Secondary | ICD-10-CM | POA: Diagnosis not present

## 2023-08-26 DIAGNOSIS — D509 Iron deficiency anemia, unspecified: Secondary | ICD-10-CM | POA: Diagnosis not present

## 2023-08-26 DIAGNOSIS — D631 Anemia in chronic kidney disease: Secondary | ICD-10-CM | POA: Diagnosis not present

## 2023-08-26 DIAGNOSIS — N250 Renal osteodystrophy: Secondary | ICD-10-CM | POA: Diagnosis not present

## 2023-08-26 DIAGNOSIS — N186 End stage renal disease: Secondary | ICD-10-CM | POA: Diagnosis not present

## 2023-08-26 DIAGNOSIS — Z992 Dependence on renal dialysis: Secondary | ICD-10-CM | POA: Diagnosis not present

## 2023-08-26 DIAGNOSIS — N2581 Secondary hyperparathyroidism of renal origin: Secondary | ICD-10-CM | POA: Diagnosis not present

## 2023-08-28 ENCOUNTER — Other Ambulatory Visit (HOSPITAL_COMMUNITY): Payer: Self-pay | Admitting: Nurse Practitioner

## 2023-08-28 ENCOUNTER — Other Ambulatory Visit: Payer: Self-pay

## 2023-08-28 ENCOUNTER — Telehealth: Payer: Self-pay

## 2023-08-28 ENCOUNTER — Ambulatory Visit (HOSPITAL_COMMUNITY)
Admission: RE | Admit: 2023-08-28 | Discharge: 2023-08-28 | Disposition: A | Discharge status: Home / Self Care | Payer: Medicare Other | Source: Ambulatory Visit | Attending: Nurse Practitioner | Admitting: Nurse Practitioner

## 2023-08-28 DIAGNOSIS — E1122 Type 2 diabetes mellitus with diabetic chronic kidney disease: Secondary | ICD-10-CM | POA: Insufficient documentation

## 2023-08-28 DIAGNOSIS — Z992 Dependence on renal dialysis: Secondary | ICD-10-CM | POA: Insufficient documentation

## 2023-08-28 DIAGNOSIS — T85621A Displacement of intraperitoneal dialysis catheter, initial encounter: Secondary | ICD-10-CM | POA: Diagnosis not present

## 2023-08-28 DIAGNOSIS — N186 End stage renal disease: Secondary | ICD-10-CM

## 2023-08-28 DIAGNOSIS — I120 Hypertensive chronic kidney disease with stage 5 chronic kidney disease or end stage renal disease: Secondary | ICD-10-CM | POA: Diagnosis not present

## 2023-08-28 DIAGNOSIS — Y831 Surgical operation with implant of artificial internal device as the cause of abnormal reaction of the patient, or of later complication, without mention of misadventure at the time of the procedure: Secondary | ICD-10-CM | POA: Diagnosis not present

## 2023-08-28 HISTORY — PX: IR US GUIDE VASC ACCESS LEFT: IMG2389

## 2023-08-28 HISTORY — PX: IR FLUORO GUIDE CV LINE LEFT: IMG2282

## 2023-08-28 LAB — GLUCOSE, CAPILLARY: Glucose-Capillary: 142 mg/dL — ABNORMAL HIGH (ref 70–99)

## 2023-08-28 MED ORDER — CEFAZOLIN SODIUM-DEXTROSE 2-4 GM/100ML-% IV SOLN
INTRAVENOUS | Status: AC | PRN
Start: 2023-08-28 — End: 2023-08-28
  Administered 2023-08-28: 2 g via INTRAVENOUS

## 2023-08-28 MED ORDER — HYDRALAZINE HCL 20 MG/ML IJ SOLN
INTRAMUSCULAR | Status: AC | PRN
  Administered 2023-08-28 (×2): 10 mg via INTRAVENOUS

## 2023-08-28 MED ORDER — ONDANSETRON HCL 4 MG/2ML IJ SOLN
INTRAMUSCULAR | Status: AC
  Filled 2023-08-28: qty 2

## 2023-08-28 MED ORDER — MIDAZOLAM HCL 2 MG/2ML IJ SOLN
INTRAMUSCULAR | Status: AC
  Filled 2023-08-28: qty 4

## 2023-08-28 MED ORDER — LABETALOL HCL 5 MG/ML IV SOLN
INTRAVENOUS | Status: AC | PRN
  Administered 2023-08-28 (×2): 10 mg via INTRAVENOUS

## 2023-08-28 MED ORDER — LABETALOL HCL 5 MG/ML IV SOLN
INTRAVENOUS | Status: AC
  Filled 2023-08-28: qty 4

## 2023-08-28 MED ORDER — FENTANYL CITRATE (PF) 100 MCG/2ML IJ SOLN
INTRAMUSCULAR | Status: AC | PRN
  Administered 2023-08-28: 50 ug via INTRAVENOUS

## 2023-08-28 MED ORDER — ONDANSETRON HCL 4 MG/2ML IJ SOLN
INTRAMUSCULAR | Status: AC | PRN
  Administered 2023-08-28: 4 mg via INTRAVENOUS

## 2023-08-28 MED ORDER — LIDOCAINE-EPINEPHRINE 1 %-1:100000 IJ SOLN
INTRAMUSCULAR | Status: AC
  Filled 2023-08-28: qty 1

## 2023-08-28 MED ORDER — FENTANYL CITRATE (PF) 100 MCG/2ML IJ SOLN
INTRAMUSCULAR | Status: AC
  Filled 2023-08-28: qty 4

## 2023-08-28 MED ORDER — HEPARIN SODIUM (PORCINE) 1000 UNIT/ML IJ SOLN
INTRAMUSCULAR | Status: AC
  Filled 2023-08-28: qty 10

## 2023-08-28 MED ORDER — LIDOCAINE-EPINEPHRINE 1 %-1:100000 IJ SOLN
20.0000 mL | Freq: Once | INTRAMUSCULAR | Status: AC
  Administered 2023-08-28: 20 mL

## 2023-08-28 MED ORDER — MIDAZOLAM HCL 2 MG/2ML IJ SOLN
INTRAMUSCULAR | Status: AC | PRN
  Administered 2023-08-28: 1 mg via INTRAVENOUS

## 2023-08-28 MED ORDER — SODIUM CHLORIDE 0.9 % IV SOLN: INTRAVENOUS | Status: DC

## 2023-08-28 MED ORDER — HYDRALAZINE HCL 20 MG/ML IJ SOLN
INTRAMUSCULAR | Status: AC
  Filled 2023-08-28: qty 1

## 2023-08-28 MED ORDER — ACETAMINOPHEN 325 MG PO TABS
650.0000 mg | ORAL_TABLET | Freq: Once | ORAL | Status: AC
  Administered 2023-08-28: 650 mg via ORAL
  Filled 2023-08-28: qty 2

## 2023-08-28 MED ORDER — CEFAZOLIN SODIUM-DEXTROSE 2-4 GM/100ML-% IV SOLN
INTRAVENOUS | Status: AC
  Filled 2023-08-28: qty 100

## 2023-08-28 MED ORDER — CEFAZOLIN SODIUM-DEXTROSE 2-4 GM/100ML-% IV SOLN: 2.0000 g | INTRAVENOUS | Status: DC

## 2023-08-28 MED ORDER — ACETAMINOPHEN 325 MG PO TABS
ORAL_TABLET | ORAL | Status: AC
  Filled 2023-08-28: qty 2

## 2023-08-28 NOTE — Telephone Encounter (Signed)
Pt called stating that her catheter fell out this morning and she doesn't have any dialysis access.  Reviewed pt's chart, returned call for clarification, two identifiers used. Pt states that when she woke up, she noticed the end of the catheter near her breast. She thought that was odd. She went to the bathroom, started brushing her teeth, and it fell out completely. She noticed a big "gush" of blood and she got the bleeding stopped. She covered the area with a bandage. She called [facility] in [place] and they are working on a referral to [facility] IR to have the catheter replaced. Pt was due for HD treatment today, but could not get it.

## 2023-08-28 NOTE — Procedures (Signed)
Vascular and Interventional Radiology Procedure Note  Patient: Patricia [name] DOB: 03-05-1971 Medical Record Number: [id number] Note Date/Time: 08/28/23 2:49 PM   Performing Physician: Roanna Banning, MD Assistant(s): None  Diagnosis: ESRD requiring Hemodialysis  Procedure: TUNNELED HEMODIALYSIS CATHETER PLACEMENT  Anesthesia: Conscious Sedation Complications: None Estimated Blood Loss: Minimal Specimens:  None  Findings:  Successful placement of left-sided, 27 cm (tip-to-cuff), tunneled hemodialysis catheter with the tip of the catheter in the proximal right atrium.  Plan: Catheter ready for use.  See detailed procedure note with images in PACS. The patient tolerated the procedure well without incident or complication and was returned to Recovery in stable condition.    Roanna Banning, MD [facility] Radiology   Pager. [phone] Clinic. [phone]

## 2023-08-28 NOTE — H&P (Signed)
   Patient Status: [facility] - Out-pt  Assessment and Plan: Patient in need of venous access.   Patricia [name], 52 year old female, presents today to the [facility] Interventional Radiology department for an image-guided tunneled dialysis catheter placement.   Risks and benefits discussed with the patient including, but not limited to bleeding, infection, vascular injury, pneumothorax which may require chest tube placement, air embolism or even death  All of the patient's questions were answered, patient is agreeable to proceed. She has been NPO. She is a full code.   Consent signed and in chart.  ______________________________________________________________________   History of Present Illness: Patricia [name] is a 52 y.o. female with a medical history significant for HTN, DM2 and ESRD on hemodialysis via left upper arm fistula. She recently underwent fistula revision with tunneled dialysis catheter placement 08/13/23. This morning, the patient woke up and her catheter had fallen out. Interventional Radiology asked to place a new tunneled dialysis catheter.   Allergies and medications reviewed.   Vital Signs: BP (!) 144/64   Pulse 79   Temp (!) 97.3 F (36.3 C) (Temporal)   Resp 19   Ht 5\' 3"  (1.6 m)   Wt 245 lb (111.1 kg)   SpO2 99%   BMI 43.40 kg/m   Physical Exam Constitutional:      General: She is not in acute distress.    Appearance: She is not ill-appearing.  HENT:     Mouth/Throat:     Mouth: Mucous membranes are moist.     Pharynx: Oropharynx is clear.  Cardiovascular:     Comments: Left upper chest covered with gauze. Site where prior line was is clean and dry.  Pulmonary:     Effort: Pulmonary effort is normal.  Skin:    General: Skin is warm and dry.  Neurological:     Mental Status: She is alert and oriented to person, place, and time.  Psychiatric:        Mood and Affect: Mood normal.        Behavior: Behavior normal.        Thought Content: Thought content  normal.        Judgment: Judgment normal.    Imaging reviewed.   Labs:  COAGS: No results for input(s): "INR", "APTT" in the last 8760 hours.  BMP: Recent Labs    05/28/23 0406 06/03/23 0858 06/04/23 0509 08/13/23 0700 08/13/23 0704 08/13/23 0725  NA 134* 138 135 133* 132* 137  K 4.4 3.7 3.7 7.2* 6.6* 3.9  CL 91* 100 96* 101 104 98  CO2 26 24 24   --   --  25  GLUCOSE 225* 80 136* 252* 252* 238*  BUN 46* 53* 36* 58* 55* 35*  CALCIUM 9.0 9.0 9.3  --   --  10.0  CREATININE 8.86* 10.88* 8.40* 10.20* 10.20* 9.51*  GFRNONAA 5* 4* 5*  --   --  5*     Electronically Signed: Mickie Kay, NP 08/28/2023, 1:28 PM   I spent a total of 15 minutes in face to face in clinical consultation, greater than 50% of which was counseling/coordinating care for venous access.

## 2023-08-28 NOTE — Progress Notes (Signed)
Patient evaluated in Short Stay post-procedure (tunneled HD catheter placement) due to pain along the catheter tract in the left upper chest/neck area. Mild swelling observed. Mild oozing noted from the skin insertion site. I explained to the patient that some pain, swelling and bleeding is not unexpected from this procedure. I requested an ice pack to be placed over the site. Patient also received PO tylenol.   Patient scheduled for hemodialysis tomorrow. I advised her to place gauze over the site if it continues to ooze and told her the HD team tomorrow can re-dress the site if needed.    Alwyn Ren, [place] [phone] 08/28/2023, 3:52 PM

## 2023-08-28 NOTE — Sedation Documentation (Signed)
Pt got nauseous post procedure and vomited. Dr. Milford Cage aware and pt was given 4mg  of Zofran.

## 2023-08-29 DIAGNOSIS — D631 Anemia in chronic kidney disease: Secondary | ICD-10-CM | POA: Diagnosis not present

## 2023-08-29 DIAGNOSIS — N2581 Secondary hyperparathyroidism of renal origin: Secondary | ICD-10-CM | POA: Diagnosis not present

## 2023-08-29 DIAGNOSIS — N250 Renal osteodystrophy: Secondary | ICD-10-CM | POA: Diagnosis not present

## 2023-08-29 DIAGNOSIS — D509 Iron deficiency anemia, unspecified: Secondary | ICD-10-CM | POA: Diagnosis not present

## 2023-08-29 DIAGNOSIS — Z992 Dependence on renal dialysis: Secondary | ICD-10-CM | POA: Diagnosis not present

## 2023-08-29 DIAGNOSIS — N186 End stage renal disease: Secondary | ICD-10-CM | POA: Diagnosis not present

## 2023-08-30 DIAGNOSIS — Z992 Dependence on renal dialysis: Secondary | ICD-10-CM | POA: Diagnosis not present

## 2023-08-30 DIAGNOSIS — N250 Renal osteodystrophy: Secondary | ICD-10-CM | POA: Diagnosis not present

## 2023-08-30 DIAGNOSIS — D631 Anemia in chronic kidney disease: Secondary | ICD-10-CM | POA: Diagnosis not present

## 2023-08-30 DIAGNOSIS — D509 Iron deficiency anemia, unspecified: Secondary | ICD-10-CM | POA: Diagnosis not present

## 2023-08-30 DIAGNOSIS — N186 End stage renal disease: Secondary | ICD-10-CM | POA: Diagnosis not present

## 2023-08-30 DIAGNOSIS — N2581 Secondary hyperparathyroidism of renal origin: Secondary | ICD-10-CM | POA: Diagnosis not present

## 2023-09-02 DIAGNOSIS — D509 Iron deficiency anemia, unspecified: Secondary | ICD-10-CM | POA: Diagnosis not present

## 2023-09-02 DIAGNOSIS — D631 Anemia in chronic kidney disease: Secondary | ICD-10-CM | POA: Diagnosis not present

## 2023-09-02 DIAGNOSIS — N2581 Secondary hyperparathyroidism of renal origin: Secondary | ICD-10-CM | POA: Diagnosis not present

## 2023-09-02 DIAGNOSIS — N186 End stage renal disease: Secondary | ICD-10-CM | POA: Diagnosis not present

## 2023-09-02 DIAGNOSIS — N250 Renal osteodystrophy: Secondary | ICD-10-CM | POA: Diagnosis not present

## 2023-09-02 DIAGNOSIS — Z992 Dependence on renal dialysis: Secondary | ICD-10-CM | POA: Diagnosis not present

## 2023-09-03 ENCOUNTER — Encounter (INDEPENDENT_AMBULATORY_CARE_PROVIDER_SITE_OTHER): Payer: Medicare Other | Admitting: Ophthalmology

## 2023-09-03 DIAGNOSIS — Z992 Dependence on renal dialysis: Secondary | ICD-10-CM | POA: Diagnosis not present

## 2023-09-03 DIAGNOSIS — Z6841 Body Mass Index (BMI) 40.0 and over, adult: Secondary | ICD-10-CM | POA: Diagnosis not present

## 2023-09-03 DIAGNOSIS — H25812 Combined forms of age-related cataract, left eye: Secondary | ICD-10-CM

## 2023-09-03 DIAGNOSIS — I10 Essential (primary) hypertension: Secondary | ICD-10-CM

## 2023-09-03 DIAGNOSIS — H35033 Hypertensive retinopathy, bilateral: Secondary | ICD-10-CM

## 2023-09-03 DIAGNOSIS — E113513 Type 2 diabetes mellitus with proliferative diabetic retinopathy with macular edema, bilateral: Secondary | ICD-10-CM

## 2023-09-03 DIAGNOSIS — Z961 Presence of intraocular lens: Secondary | ICD-10-CM

## 2023-09-03 DIAGNOSIS — E119 Type 2 diabetes mellitus without complications: Secondary | ICD-10-CM | POA: Diagnosis not present

## 2023-09-03 DIAGNOSIS — H33103 Unspecified retinoschisis, bilateral: Secondary | ICD-10-CM

## 2023-09-03 DIAGNOSIS — Z4901 Encounter for fitting and adjustment of extracorporeal dialysis catheter: Secondary | ICD-10-CM | POA: Diagnosis not present

## 2023-09-03 DIAGNOSIS — N186 End stage renal disease: Secondary | ICD-10-CM | POA: Diagnosis not present

## 2023-09-03 DIAGNOSIS — Z794 Long term (current) use of insulin: Secondary | ICD-10-CM

## 2023-09-04 DIAGNOSIS — Z992 Dependence on renal dialysis: Secondary | ICD-10-CM | POA: Diagnosis not present

## 2023-09-04 DIAGNOSIS — N186 End stage renal disease: Secondary | ICD-10-CM | POA: Diagnosis not present

## 2023-09-04 DIAGNOSIS — D631 Anemia in chronic kidney disease: Secondary | ICD-10-CM | POA: Diagnosis not present

## 2023-09-04 DIAGNOSIS — D509 Iron deficiency anemia, unspecified: Secondary | ICD-10-CM | POA: Diagnosis not present

## 2023-09-04 DIAGNOSIS — N250 Renal osteodystrophy: Secondary | ICD-10-CM | POA: Diagnosis not present

## 2023-09-04 DIAGNOSIS — N2581 Secondary hyperparathyroidism of renal origin: Secondary | ICD-10-CM | POA: Diagnosis not present

## 2023-09-05 DIAGNOSIS — Z992 Dependence on renal dialysis: Secondary | ICD-10-CM | POA: Diagnosis not present

## 2023-09-05 DIAGNOSIS — N186 End stage renal disease: Secondary | ICD-10-CM | POA: Diagnosis not present

## 2023-09-06 DIAGNOSIS — D631 Anemia in chronic kidney disease: Secondary | ICD-10-CM | POA: Diagnosis not present

## 2023-09-06 DIAGNOSIS — D509 Iron deficiency anemia, unspecified: Secondary | ICD-10-CM | POA: Diagnosis not present

## 2023-09-06 DIAGNOSIS — Z992 Dependence on renal dialysis: Secondary | ICD-10-CM | POA: Diagnosis not present

## 2023-09-06 DIAGNOSIS — N2581 Secondary hyperparathyroidism of renal origin: Secondary | ICD-10-CM | POA: Diagnosis not present

## 2023-09-06 DIAGNOSIS — N186 End stage renal disease: Secondary | ICD-10-CM | POA: Diagnosis not present

## 2023-09-06 DIAGNOSIS — N250 Renal osteodystrophy: Secondary | ICD-10-CM | POA: Diagnosis not present

## 2023-09-09 DIAGNOSIS — N2581 Secondary hyperparathyroidism of renal origin: Secondary | ICD-10-CM | POA: Diagnosis not present

## 2023-09-09 DIAGNOSIS — N250 Renal osteodystrophy: Secondary | ICD-10-CM | POA: Diagnosis not present

## 2023-09-09 DIAGNOSIS — Z992 Dependence on renal dialysis: Secondary | ICD-10-CM | POA: Diagnosis not present

## 2023-09-09 DIAGNOSIS — N186 End stage renal disease: Secondary | ICD-10-CM | POA: Diagnosis not present

## 2023-09-09 DIAGNOSIS — D509 Iron deficiency anemia, unspecified: Secondary | ICD-10-CM | POA: Diagnosis not present

## 2023-09-09 DIAGNOSIS — D631 Anemia in chronic kidney disease: Secondary | ICD-10-CM | POA: Diagnosis not present

## 2023-09-11 DIAGNOSIS — N2581 Secondary hyperparathyroidism of renal origin: Secondary | ICD-10-CM | POA: Diagnosis not present

## 2023-09-11 DIAGNOSIS — N186 End stage renal disease: Secondary | ICD-10-CM | POA: Diagnosis not present

## 2023-09-11 DIAGNOSIS — D509 Iron deficiency anemia, unspecified: Secondary | ICD-10-CM | POA: Diagnosis not present

## 2023-09-11 DIAGNOSIS — D631 Anemia in chronic kidney disease: Secondary | ICD-10-CM | POA: Diagnosis not present

## 2023-09-11 DIAGNOSIS — Z992 Dependence on renal dialysis: Secondary | ICD-10-CM | POA: Diagnosis not present

## 2023-09-11 DIAGNOSIS — N250 Renal osteodystrophy: Secondary | ICD-10-CM | POA: Diagnosis not present

## 2023-09-13 DIAGNOSIS — Z992 Dependence on renal dialysis: Secondary | ICD-10-CM | POA: Diagnosis not present

## 2023-09-13 DIAGNOSIS — D631 Anemia in chronic kidney disease: Secondary | ICD-10-CM | POA: Diagnosis not present

## 2023-09-13 DIAGNOSIS — D509 Iron deficiency anemia, unspecified: Secondary | ICD-10-CM | POA: Diagnosis not present

## 2023-09-13 DIAGNOSIS — N186 End stage renal disease: Secondary | ICD-10-CM | POA: Diagnosis not present

## 2023-09-13 DIAGNOSIS — N2581 Secondary hyperparathyroidism of renal origin: Secondary | ICD-10-CM | POA: Diagnosis not present

## 2023-09-13 DIAGNOSIS — N250 Renal osteodystrophy: Secondary | ICD-10-CM | POA: Diagnosis not present

## 2023-09-14 ENCOUNTER — Other Ambulatory Visit: Payer: Self-pay

## 2023-09-14 ENCOUNTER — Emergency Department (HOSPITAL_COMMUNITY): Payer: Medicare Other

## 2023-09-14 ENCOUNTER — Inpatient Hospital Stay (HOSPITAL_COMMUNITY)
Admission: EM | Admit: 2023-09-14 | Discharge: 2023-09-24 | DRG: 314 | Disposition: A | Discharge status: Home / Self Care | Payer: Medicare Other | Attending: Internal Medicine | Admitting: Internal Medicine

## 2023-09-14 ENCOUNTER — Encounter (HOSPITAL_COMMUNITY): Payer: Self-pay | Admitting: *Deleted

## 2023-09-14 DIAGNOSIS — Z853 Personal history of malignant neoplasm of breast: Secondary | ICD-10-CM

## 2023-09-14 DIAGNOSIS — R0989 Other specified symptoms and signs involving the circulatory and respiratory systems: Secondary | ICD-10-CM | POA: Diagnosis not present

## 2023-09-14 DIAGNOSIS — I120 Hypertensive chronic kidney disease with stage 5 chronic kidney disease or end stage renal disease: Secondary | ICD-10-CM | POA: Diagnosis not present

## 2023-09-14 DIAGNOSIS — R918 Other nonspecific abnormal finding of lung field: Secondary | ICD-10-CM | POA: Diagnosis present

## 2023-09-14 DIAGNOSIS — R509 Fever, unspecified: Secondary | ICD-10-CM | POA: Diagnosis not present

## 2023-09-14 DIAGNOSIS — Z8249 Family history of ischemic heart disease and other diseases of the circulatory system: Secondary | ICD-10-CM

## 2023-09-14 DIAGNOSIS — Z79899 Other long term (current) drug therapy: Secondary | ICD-10-CM

## 2023-09-14 DIAGNOSIS — Z86718 Personal history of other venous thrombosis and embolism: Secondary | ICD-10-CM

## 2023-09-14 DIAGNOSIS — R6520 Severe sepsis without septic shock: Secondary | ICD-10-CM | POA: Diagnosis present

## 2023-09-14 DIAGNOSIS — Z1152 Encounter for screening for COVID-19: Secondary | ICD-10-CM | POA: Diagnosis not present

## 2023-09-14 DIAGNOSIS — Z7984 Long term (current) use of oral hypoglycemic drugs: Secondary | ICD-10-CM

## 2023-09-14 DIAGNOSIS — A4101 Sepsis due to Methicillin susceptible Staphylococcus aureus: Secondary | ICD-10-CM | POA: Diagnosis present

## 2023-09-14 DIAGNOSIS — Y838 Other surgical procedures as the cause of abnormal reaction of the patient, or of later complication, without mention of misadventure at the time of the procedure: Secondary | ICD-10-CM | POA: Diagnosis present

## 2023-09-14 DIAGNOSIS — I082 Rheumatic disorders of both aortic and tricuspid valves: Secondary | ICD-10-CM | POA: Diagnosis present

## 2023-09-14 DIAGNOSIS — K219 Gastro-esophageal reflux disease without esophagitis: Secondary | ICD-10-CM | POA: Diagnosis present

## 2023-09-14 DIAGNOSIS — J9602 Acute respiratory failure with hypercapnia: Secondary | ICD-10-CM | POA: Diagnosis not present

## 2023-09-14 DIAGNOSIS — X58XXXA Exposure to other specified factors, initial encounter: Secondary | ICD-10-CM | POA: Diagnosis present

## 2023-09-14 DIAGNOSIS — J9622 Acute and chronic respiratory failure with hypercapnia: Secondary | ICD-10-CM | POA: Diagnosis not present

## 2023-09-14 DIAGNOSIS — I10 Essential (primary) hypertension: Secondary | ICD-10-CM | POA: Diagnosis present

## 2023-09-14 DIAGNOSIS — Z452 Encounter for adjustment and management of vascular access device: Secondary | ICD-10-CM | POA: Diagnosis not present

## 2023-09-14 DIAGNOSIS — R609 Edema, unspecified: Secondary | ICD-10-CM | POA: Diagnosis not present

## 2023-09-14 DIAGNOSIS — Z992 Dependence on renal dialysis: Secondary | ICD-10-CM

## 2023-09-14 DIAGNOSIS — B9561 Methicillin susceptible Staphylococcus aureus infection as the cause of diseases classified elsewhere: Secondary | ICD-10-CM | POA: Insufficient documentation

## 2023-09-14 DIAGNOSIS — R7881 Bacteremia: Secondary | ICD-10-CM

## 2023-09-14 DIAGNOSIS — R000 Tachycardia, unspecified: Secondary | ICD-10-CM | POA: Diagnosis not present

## 2023-09-14 DIAGNOSIS — E1122 Type 2 diabetes mellitus with diabetic chronic kidney disease: Secondary | ICD-10-CM | POA: Diagnosis not present

## 2023-09-14 DIAGNOSIS — E1169 Type 2 diabetes mellitus with other specified complication: Secondary | ICD-10-CM | POA: Diagnosis not present

## 2023-09-14 DIAGNOSIS — M542 Cervicalgia: Secondary | ICD-10-CM | POA: Diagnosis present

## 2023-09-14 DIAGNOSIS — E872 Acidosis, unspecified: Secondary | ICD-10-CM | POA: Diagnosis present

## 2023-09-14 DIAGNOSIS — M25461 Effusion, right knee: Secondary | ICD-10-CM | POA: Diagnosis not present

## 2023-09-14 DIAGNOSIS — N250 Renal osteodystrophy: Secondary | ICD-10-CM | POA: Diagnosis not present

## 2023-09-14 DIAGNOSIS — N19 Unspecified kidney failure: Secondary | ICD-10-CM | POA: Diagnosis not present

## 2023-09-14 DIAGNOSIS — A419 Sepsis, unspecified organism: Secondary | ICD-10-CM | POA: Diagnosis not present

## 2023-09-14 DIAGNOSIS — Z6841 Body Mass Index (BMI) 40.0 and over, adult: Secondary | ICD-10-CM

## 2023-09-14 DIAGNOSIS — Z9012 Acquired absence of left breast and nipple: Secondary | ICD-10-CM

## 2023-09-14 DIAGNOSIS — N2581 Secondary hyperparathyroidism of renal origin: Secondary | ICD-10-CM | POA: Diagnosis present

## 2023-09-14 DIAGNOSIS — N186 End stage renal disease: Secondary | ICD-10-CM

## 2023-09-14 DIAGNOSIS — E785 Hyperlipidemia, unspecified: Secondary | ICD-10-CM | POA: Diagnosis present

## 2023-09-14 DIAGNOSIS — Z9981 Dependence on supplemental oxygen: Secondary | ICD-10-CM

## 2023-09-14 DIAGNOSIS — Z8616 Personal history of COVID-19: Secondary | ICD-10-CM

## 2023-09-14 DIAGNOSIS — J9811 Atelectasis: Secondary | ICD-10-CM | POA: Diagnosis not present

## 2023-09-14 DIAGNOSIS — T80211A Bloodstream infection due to central venous catheter, initial encounter: Principal | ICD-10-CM | POA: Diagnosis present

## 2023-09-14 DIAGNOSIS — Z9049 Acquired absence of other specified parts of digestive tract: Secondary | ICD-10-CM

## 2023-09-14 DIAGNOSIS — Z794 Long term (current) use of insulin: Secondary | ICD-10-CM

## 2023-09-14 DIAGNOSIS — Z888 Allergy status to other drugs, medicaments and biological substances status: Secondary | ICD-10-CM

## 2023-09-14 DIAGNOSIS — S83421A Sprain of lateral collateral ligament of right knee, initial encounter: Secondary | ICD-10-CM | POA: Diagnosis present

## 2023-09-14 DIAGNOSIS — Z833 Family history of diabetes mellitus: Secondary | ICD-10-CM

## 2023-09-14 DIAGNOSIS — M25512 Pain in left shoulder: Secondary | ICD-10-CM | POA: Diagnosis present

## 2023-09-14 DIAGNOSIS — Z7982 Long term (current) use of aspirin: Secondary | ICD-10-CM

## 2023-09-14 DIAGNOSIS — K5730 Diverticulosis of large intestine without perforation or abscess without bleeding: Secondary | ICD-10-CM | POA: Diagnosis not present

## 2023-09-14 DIAGNOSIS — Z23 Encounter for immunization: Secondary | ICD-10-CM | POA: Diagnosis not present

## 2023-09-14 DIAGNOSIS — J9621 Acute and chronic respiratory failure with hypoxia: Secondary | ICD-10-CM | POA: Diagnosis not present

## 2023-09-14 DIAGNOSIS — J45909 Unspecified asthma, uncomplicated: Secondary | ICD-10-CM | POA: Diagnosis present

## 2023-09-14 DIAGNOSIS — R0602 Shortness of breath: Secondary | ICD-10-CM | POA: Diagnosis not present

## 2023-09-14 DIAGNOSIS — I7090 Unspecified atherosclerosis: Secondary | ICD-10-CM | POA: Diagnosis not present

## 2023-09-14 DIAGNOSIS — E66813 Obesity, class 3: Secondary | ICD-10-CM | POA: Diagnosis present

## 2023-09-14 DIAGNOSIS — J9690 Respiratory failure, unspecified, unspecified whether with hypoxia or hypercapnia: Secondary | ICD-10-CM | POA: Diagnosis not present

## 2023-09-14 DIAGNOSIS — D631 Anemia in chronic kidney disease: Secondary | ICD-10-CM | POA: Diagnosis present

## 2023-09-14 DIAGNOSIS — R112 Nausea with vomiting, unspecified: Secondary | ICD-10-CM | POA: Diagnosis not present

## 2023-09-14 DIAGNOSIS — Z90710 Acquired absence of both cervix and uterus: Secondary | ICD-10-CM

## 2023-09-14 DIAGNOSIS — I82C11 Acute embolism and thrombosis of right internal jugular vein: Secondary | ICD-10-CM | POA: Diagnosis present

## 2023-09-14 DIAGNOSIS — M25561 Pain in right knee: Secondary | ICD-10-CM | POA: Diagnosis not present

## 2023-09-14 DIAGNOSIS — B9689 Other specified bacterial agents as the cause of diseases classified elsewhere: Secondary | ICD-10-CM | POA: Diagnosis not present

## 2023-09-14 DIAGNOSIS — K5900 Constipation, unspecified: Secondary | ICD-10-CM | POA: Diagnosis not present

## 2023-09-14 DIAGNOSIS — K429 Umbilical hernia without obstruction or gangrene: Secondary | ICD-10-CM | POA: Diagnosis not present

## 2023-09-14 DIAGNOSIS — B9562 Methicillin resistant Staphylococcus aureus infection as the cause of diseases classified elsewhere: Secondary | ICD-10-CM | POA: Diagnosis not present

## 2023-09-14 DIAGNOSIS — I361 Nonrheumatic tricuspid (valve) insufficiency: Secondary | ICD-10-CM | POA: Diagnosis not present

## 2023-09-14 DIAGNOSIS — G4733 Obstructive sleep apnea (adult) (pediatric): Secondary | ICD-10-CM | POA: Diagnosis present

## 2023-09-14 DIAGNOSIS — I517 Cardiomegaly: Secondary | ICD-10-CM | POA: Diagnosis not present

## 2023-09-14 DIAGNOSIS — J984 Other disorders of lung: Secondary | ICD-10-CM | POA: Diagnosis not present

## 2023-09-14 DIAGNOSIS — D509 Iron deficiency anemia, unspecified: Secondary | ICD-10-CM | POA: Diagnosis not present

## 2023-09-14 LAB — COMPREHENSIVE METABOLIC PANEL
ALT: 13 U/L (ref 0–44)
AST: 17 U/L (ref 15–41)
Albumin: 3.8 g/dL (ref 3.5–5.0)
Alkaline Phosphatase: 88 U/L (ref 38–126)
Anion gap: 19 — ABNORMAL HIGH (ref 5–15)
BUN: 44 mg/dL — ABNORMAL HIGH (ref 6–20)
CO2: 20 mmol/L — ABNORMAL LOW (ref 22–32)
Calcium: 9 mg/dL (ref 8.9–10.3)
Chloride: 90 mmol/L — ABNORMAL LOW (ref 98–111)
Creatinine, Ser: 10.3 mg/dL — ABNORMAL HIGH (ref 0.44–1.00)
GFR, Estimated: 4 mL/min — ABNORMAL LOW (ref >60)
Glucose, Bld: 257 mg/dL — ABNORMAL HIGH (ref 70–99)
Potassium: 3.6 mmol/L (ref 3.5–5.1)
Sodium: 129 mmol/L — ABNORMAL LOW (ref 135–145)
Total Bilirubin: 0.4 mg/dL (ref 0.3–1.2)
Total Protein: 7.7 g/dL (ref 6.5–8.1)

## 2023-09-14 LAB — CBC WITH DIFFERENTIAL/PLATELET
Abs Immature Granulocytes: 0.17 10*3/uL — ABNORMAL HIGH (ref 0.00–0.07)
Basophils Absolute: 0.1 10*3/uL (ref 0.0–0.1)
Basophils Relative: 0 %
Eosinophils Absolute: 1.2 10*3/uL — ABNORMAL HIGH (ref 0.0–0.5)
Eosinophils Relative: 6 %
HCT: 26.6 % — ABNORMAL LOW (ref 36.0–46.0)
Hemoglobin: 8.2 g/dL — ABNORMAL LOW (ref 12.0–15.0)
Immature Granulocytes: 1 %
Lymphocytes Relative: 1 %
Lymphs Abs: 0.3 10*3/uL — ABNORMAL LOW (ref 0.7–4.0)
MCH: 28.6 pg (ref 26.0–34.0)
MCHC: 30.8 g/dL (ref 30.0–36.0)
MCV: 92.7 fL (ref 80.0–100.0)
Monocytes Absolute: 1 10*3/uL (ref 0.1–1.0)
Monocytes Relative: 4 %
Neutro Abs: 19.7 10*3/uL — ABNORMAL HIGH (ref 1.7–7.7)
Neutrophils Relative %: 88 %
Platelets: 187 10*3/uL (ref 150–400)
RBC: 2.87 MIL/uL — ABNORMAL LOW (ref 3.87–5.11)
RDW: 14.8 % (ref 11.5–15.5)
WBC: 22 10*3/uL — ABNORMAL HIGH (ref 4.0–10.5)
nRBC: 0 % (ref 0.0–0.2)

## 2023-09-14 LAB — PROTIME-INR
INR: 1.5 — ABNORMAL HIGH (ref 0.8–1.2)
Prothrombin Time: 18.5 s — ABNORMAL HIGH (ref 11.4–15.2)

## 2023-09-14 LAB — BRAIN NATRIURETIC PEPTIDE: B Natriuretic Peptide: 296 pg/mL — ABNORMAL HIGH (ref 0.0–100.0)

## 2023-09-14 LAB — GLUCOSE, CAPILLARY: Glucose-Capillary: 201 mg/dL — ABNORMAL HIGH (ref 70–99)

## 2023-09-14 LAB — MRSA NEXT GEN BY PCR, NASAL: MRSA by PCR Next Gen: NOT DETECTED

## 2023-09-14 LAB — BLOOD GAS, VENOUS
Acid-base deficit: 1.9 mmol/L (ref 0.0–2.0)
Bicarbonate: 24.3 mmol/L (ref 20.0–28.0)
Drawn by: 27160
O2 Saturation: 39.4 %
Patient temperature: 39.5
pCO2, Ven: 51 mm[Hg] (ref 44–60)
pH, Ven: 7.29 (ref 7.25–7.43)
pO2, Ven: <31 mm[Hg] — CL (ref 32–45)

## 2023-09-14 LAB — SARS CORONAVIRUS 2 BY RT PCR: SARS Coronavirus 2 by RT PCR: NEGATIVE

## 2023-09-14 LAB — LACTIC ACID, PLASMA
Lactic Acid, Venous: 2.9 mmol/L (ref 0.5–1.9)
Lactic Acid, Venous: 1.4 mmol/L (ref 0.5–1.9)

## 2023-09-14 LAB — TROPONIN I (HIGH SENSITIVITY)
Troponin I (High Sensitivity): 58 ng/L — ABNORMAL HIGH (ref <18)
Troponin I (High Sensitivity): 63 ng/L — ABNORMAL HIGH (ref <18)

## 2023-09-14 MED ORDER — ASPIRIN 81 MG PO TBEC
81.0000 mg | DELAYED_RELEASE_TABLET | Freq: Every day | ORAL | Status: DC
  Administered 2023-09-15 – 2023-09-23 (×8): 81 mg via ORAL
  Filled 2023-09-14 (×10): qty 1

## 2023-09-14 MED ORDER — PANTOPRAZOLE SODIUM 40 MG PO TBEC
40.0000 mg | DELAYED_RELEASE_TABLET | Freq: Every day | ORAL | Status: DC
  Administered 2023-09-15 – 2023-09-19 (×4): 40 mg via ORAL
  Filled 2023-09-14 (×4): qty 1

## 2023-09-14 MED ORDER — ROSUVASTATIN CALCIUM 20 MG PO TABS
20.0000 mg | ORAL_TABLET | Freq: Every day | ORAL | Status: DC
  Administered 2023-09-15 – 2023-09-23 (×8): 20 mg via ORAL
  Filled 2023-09-14 (×9): qty 1

## 2023-09-14 MED ORDER — SEVELAMER CARBONATE 800 MG PO TABS: 1600.0000 mg | ORAL_TABLET | ORAL | Status: DC

## 2023-09-14 MED ORDER — MONTELUKAST SODIUM 10 MG PO TABS
10.0000 mg | ORAL_TABLET | Freq: Every day | ORAL | Status: DC
  Administered 2023-09-14 – 2023-09-23 (×10): 10 mg via ORAL
  Filled 2023-09-14 (×10): qty 1

## 2023-09-14 MED ORDER — VANCOMYCIN HCL 1750 MG/350ML IV SOLN
1750.0000 mg | Freq: Once | INTRAVENOUS | Status: AC
  Administered 2023-09-14: 1750 mg via INTRAVENOUS
  Filled 2023-09-14: qty 350

## 2023-09-14 MED ORDER — ROPINIROLE HCL ER 2 MG PO TB24
2.0000 mg | ORAL_TABLET | Freq: Every day | ORAL | Status: DC
  Administered 2023-09-16: 2 mg via ORAL

## 2023-09-14 MED ORDER — ALPRAZOLAM 0.5 MG PO TABS
0.5000 mg | ORAL_TABLET | Freq: Every evening | ORAL | Status: DC | PRN
  Administered 2023-09-15 – 2023-09-23 (×8): 0.5 mg via ORAL
  Filled 2023-09-14 (×8): qty 1

## 2023-09-14 MED ORDER — VANCOMYCIN HCL 1750 MG/350ML IV SOLN: 1750.0000 mg | Freq: Once | INTRAVENOUS | Status: DC

## 2023-09-14 MED ORDER — CINACALCET HCL 30 MG PO TABS: 30.0000 mg | ORAL_TABLET | Freq: Every day | ORAL | Status: DC

## 2023-09-14 MED ORDER — ALBUTEROL SULFATE (2.5 MG/3ML) 0.083% IN NEBU: 2.5000 mg | INHALATION_SOLUTION | Freq: Four times a day (QID) | RESPIRATORY_TRACT | Status: DC | PRN

## 2023-09-14 MED ORDER — ACETAMINOPHEN 650 MG RE SUPP: 650.0000 mg | Freq: Four times a day (QID) | RECTAL | Status: DC | PRN

## 2023-09-14 MED ORDER — INSULIN ASPART 100 UNIT/ML IJ SOLN
0.0000 [IU] | Freq: Three times a day (TID) | INTRAMUSCULAR | Status: DC
  Administered 2023-09-15: 3 [IU] via SUBCUTANEOUS
  Administered 2023-09-15 – 2023-09-16 (×3): 2 [IU] via SUBCUTANEOUS
  Administered 2023-09-16: 1 [IU] via SUBCUTANEOUS
  Administered 2023-09-17: 2 [IU] via SUBCUTANEOUS

## 2023-09-14 MED ORDER — SODIUM CHLORIDE 0.9 % IV BOLUS: 1000.0000 mL | Freq: Once | INTRAVENOUS | Status: DC

## 2023-09-14 MED ORDER — ONDANSETRON HCL 4 MG/2ML IJ SOLN
4.0000 mg | Freq: Four times a day (QID) | INTRAMUSCULAR | Status: DC | PRN
  Administered 2023-09-16 – 2023-09-23 (×7): 4 mg via INTRAVENOUS
  Filled 2023-09-14 (×7): qty 2

## 2023-09-14 MED ORDER — ONDANSETRON HCL 4 MG PO TABS
4.0000 mg | ORAL_TABLET | Freq: Four times a day (QID) | ORAL | Status: DC | PRN
  Administered 2023-09-17 – 2023-09-24 (×2): 4 mg via ORAL
  Filled 2023-09-14 (×2): qty 1

## 2023-09-14 MED ORDER — ACETAMINOPHEN 500 MG PO TABS
1000.0000 mg | ORAL_TABLET | Freq: Once | ORAL | Status: AC
  Administered 2023-09-14: 1000 mg via ORAL
  Filled 2023-09-14: qty 2

## 2023-09-14 MED ORDER — HEPARIN SODIUM (PORCINE) 5000 UNIT/ML IJ SOLN
5000.0000 [IU] | Freq: Three times a day (TID) | INTRAMUSCULAR | Status: DC
  Administered 2023-09-14 – 2023-09-23 (×24): 5000 [IU] via SUBCUTANEOUS
  Filled 2023-09-14 (×25): qty 1

## 2023-09-14 MED ORDER — ACETAMINOPHEN 325 MG PO TABS
650.0000 mg | ORAL_TABLET | Freq: Four times a day (QID) | ORAL | Status: DC | PRN
  Administered 2023-09-14 – 2023-09-20 (×5): 650 mg via ORAL
  Filled 2023-09-14 (×4): qty 2

## 2023-09-14 MED ORDER — SODIUM CHLORIDE 0.9 % IV SOLN
1.0000 g | Freq: Once | INTRAVENOUS | Status: AC
  Administered 2023-09-14: 1 g via INTRAVENOUS
  Filled 2023-09-14 (×2): qty 10

## 2023-09-14 MED ORDER — ENOXAPARIN SODIUM 40 MG/0.4ML IJ SOSY: 40.0000 mg | PREFILLED_SYRINGE | INTRAMUSCULAR | Status: DC

## 2023-09-14 MED ORDER — LORATADINE 10 MG PO TABS
10.0000 mg | ORAL_TABLET | Freq: Every day | ORAL | Status: DC
  Administered 2023-09-15 – 2023-09-23 (×8): 10 mg via ORAL
  Filled 2023-09-14 (×9): qty 1

## 2023-09-14 MED ORDER — SODIUM CHLORIDE 0.9 % IV SOLN
1.0000 g | INTRAVENOUS | Status: DC
  Filled 2023-09-14: qty 10

## 2023-09-14 NOTE — Progress Notes (Addendum)
Pharmacy Antibiotic Note  Patricia [name] is a 52 y.o. female admitted on 09/14/2023 with bacteremia.  Patient has a past medical history of HTN, DM2 and ESRD on hemodialysis. Pharmacy has been consulted for vancomycin dosing.  Plan: Day 1 of antibiotics Give vancomycin 1750 mg IV x1  Continue cefepime 1 g IV Q24H Further dosing of vancomycin will depend on HD plans, pharmacy will follow and dose when appropriate Continue to follow culture results    Height: 5\' 3"  (160 cm) Weight: 111.1 kg (245 lb) IBW/kg (Calculated) : 52.4  Temp (24hrs), Avg:103.2 F (39.6 C), Min:103.1 F (39.5 C), Max:103.2 F (39.6 C)  No results for input(s): "WBC", "CREATININE", "LATICACIDVEN", "VANCOTROUGH", "VANCOPEAK", "VANCORANDOM", "GENTTROUGH", "GENTPEAK", "GENTRANDOM", "TOBRATROUGH", "TOBRAPEAK", "TOBRARND", "AMIKACINPEAK", "AMIKACINTROU", "AMIKACIN" in the last 168 hours.  CrCl cannot be calculated (Patient's most recent lab result is older than the maximum 21 days allowed.).    Allergies  Allergen Reactions   Amlodipine Besylate Rash and Other (See Comments)    dizziness   Reglan [Metoclopramide] Other (See Comments)    hallucinations     Antimicrobials this admission: 9/28 Vanc >>  9/28 Cefepime >>   Dose adjustments this admission: None  Microbiology results: None  Thank you for allowing pharmacy to be a part of this patient's care.  Merryl Hacker, PharmD Clinical Pharmacist 09/14/2023 4:48 PM

## 2023-09-14 NOTE — ED Provider Notes (Signed)
[facility] AT [facility] Provider Note   CSN: [id number] Arrival date & time: 09/14/23  1549     History {Add pertinent medical, surgical, social history, OB history to HPI:1} Chief Complaint  Patient presents with   Shortness of Breath    Patricia [name] is a 52 y.o. female.   Shortness of Breath   52 year old female presents emergency department with complaints of cough, shortness of breath, chills, weakness.  Patient states that she has had symptoms for the past 2 to 3 days with worsening since yesterday.  Patient with history of ESRD on hemodialysis most recently receiving full dialysis session yesterday.  States that she was told to come to emergency department due to looking unwell but she declined due to "weather being bad."  Patient states that symptoms worsened over yesterday prompting visit to the emergency department today.  Patient with history of chronic respiratory failure on 2 L but is felt more short of breath since symptom onset on her 2 L.  Reports episode of emesis on Thursday.  Denies any chest pain, abdominal pain, change in bowel habits.  States that she has had pain since placement of her hemodialysis catheter  Past medical history significant for diabetes mellitus, ESRD on hemodialysis, HCAP, hypertension, CHF, OSA, chronic respiratory failure with hypoxia on 2 L, bronchiolitis obliterans  Home Medications Prior to Admission medications   Medication Sig Start Date End Date Taking? Authorizing Provider  acetaminophen (TYLENOL) 325 MG tablet Take 650 mg by mouth every 6 (six) hours as needed for mild pain or fever.    [provider]  albuterol (VENTOLIN HFA) 108 (90 Base) MCG/ACT inhaler Inhale 2 puffs into the lungs every 6 (six) hours as needed for wheezing or shortness of breath. 01/15/23   Parrett, Virgel Bouquet, NP  aspirin EC 81 MG tablet Take 1 tablet (81 mg total) by mouth daily. Swallow whole. 12/07/21   Pricilla Riffle, MD   Carboxymethylcell-Glycerin PF (REFRESH OPTIVE PF) 0.5-0.9 % SOLN Place 1 drop into both eyes as needed (dry eyes).    [provider]  cetirizine (ZYRTEC) 10 MG tablet Take 1 tablet (10 mg total) by mouth daily. 06/21/23   Coralyn Helling, MD  cinacalcet (SENSIPAR) 30 MG tablet Take 30 mg by mouth daily.    [provider]  docusate sodium (COLACE) 100 MG capsule Take 100 mg by mouth daily.    [provider]  ferric citrate (AURYXIA) 1 GM 210 MG(Fe) tablet Take 420 mg by mouth 2 (two) times daily with a meal. With Breakfast & with supper    [provider]  glipiZIDE (GLUCOTROL) 5 MG tablet Take 1 tablet (5 mg total) by mouth daily before breakfast. 05/21/23   Nida, Denman George, MD  insulin degludec (TRESIBA FLEXTOUCH) 200 UNIT/ML FlexTouch Pen Inject 20 Units into the skin at bedtime. 08/06/23   Roma Kayser, MD  ipratropium (ATROVENT) 0.06 % nasal spray Place 2 sprays into both nostrils daily.    [provider]  irbesartan (AVAPRO) 75 MG tablet Take 75 mg by mouth daily. 01/18/23   [provider]  labetalol (NORMODYNE) 100 MG tablet Take 100 mg by mouth 2 (two) times daily.    [provider]  linaclotide (LINZESS) 290 MCG CAPS capsule Take 290 mcg by mouth daily before breakfast.    [provider]  montelukast (SINGULAIR) 10 MG tablet Take 1 tablet (10 mg total) by mouth at bedtime. 04/22/23  Coralyn Helling, MD  multivitamin (RENA-VIT) TABS tablet Take 1 tablet by mouth daily.    [provider]  NIFEdipine (ADALAT CC) 30 MG 24 hr tablet Take 30 mg by mouth at bedtime. 11/26/21   [provider]  nitroGLYCERIN (NITROSTAT) 0.4 MG SL tablet Place 1 tablet (0.4 mg total) under the tongue every 5 (five) minutes as needed for chest pain. 11/28/22 11/28/23  Rai, Ripudeep K, MD  ondansetron (ZOFRAN) 4 MG tablet Take 1 tablet (4 mg total) by mouth daily as needed for nausea or vomiting. Patient not taking:  Reported on 08/09/2023 06/04/23 06/03/24  Maurilio Lovely D, DO  ondansetron (ZOFRAN-ODT) 4 MG disintegrating tablet Take 1 tablet (4 mg total) by mouth every 8 (eight) hours as needed for nausea or vomiting. 02/19/23   Gelene Mink, NP  OXYGEN Inhale 2 L into the lungs continuous.    [provider]  pantoprazole (PROTONIX) 40 MG tablet TAKE 1 TABLET (40 MG TOTAL) BY MOUTH DAILY 30 MINUTES BEFORE BREAKFAST 01/10/23   Rourk, Gerrit Friends, MD  rOPINIRole (REQUIP XL) 2 MG 24 hr tablet Take 2 mg by mouth at bedtime.  09/10/16   [provider]  rosuvastatin (CRESTOR) 20 MG tablet Take 20 mg by mouth daily.    [provider]  sevelamer carbonate (RENVELA) 800 MG tablet Take 1,600-2,400 mg by mouth See admin instructions. 1600 mg at breakfast, and 2400 mg at lunch and 1600 mg at dinner    [provider]  sodium chloride (OCEAN) 0.65 % SOLN nasal spray Place 1 spray into both nostrils as needed for congestion. 01/02/20   Hongalgi, Maximino Greenland, MD      Allergies    Amlodipine besylate and Reglan [metoclopramide]    Review of Systems   Review of Systems  Respiratory:  Positive for shortness of breath.   All other systems reviewed and are negative.   Physical Exam Updated Vital Signs BP (!) 150/73 (BP Location: Right Arm)   Pulse (!) 107   Temp (!) 103.1 F (39.5 C) (Oral)   Resp (!) 22   Ht 5\' 3"  (1.6 m)   Wt 111.1 kg   SpO2 (!) 89%   BMI 43.40 kg/m  Physical Exam Vitals and nursing note reviewed.  Constitutional:      General: She is in acute distress.     Appearance: She is well-developed. She is obese. She is ill-appearing and toxic-appearing.  HENT:     Head: Normocephalic and atraumatic.  Eyes:     Conjunctiva/sclera: Conjunctivae normal.  Cardiovascular:     Rate and Rhythm: Normal rate and regular rhythm.     Heart sounds: No murmur heard. Pulmonary:     Effort: Tachypnea and respiratory distress present.     Breath sounds: Decreased breath sounds and  rales present.  Abdominal:     Palpations: Abdomen is soft.     Tenderness: There is no abdominal tenderness. There is no guarding.  Musculoskeletal:        General: No swelling.     Cervical back: Neck supple.  Skin:    General: Skin is warm and dry.     Capillary Refill: Capillary refill takes less than 2 seconds.  Neurological:     Mental Status: She is alert.  Psychiatric:        Mood and Affect: Mood normal.     ED Results / Procedures / Treatments   Labs (all labs ordered are listed, but only abnormal results are  displayed) Labs Reviewed  SARS CORONAVIRUS 2 BY RT PCR  CULTURE, BLOOD (ROUTINE X 2)  CULTURE, BLOOD (ROUTINE X 2)  COMPREHENSIVE METABOLIC PANEL  LACTIC ACID, PLASMA  LACTIC ACID, PLASMA  CBC WITH DIFFERENTIAL/PLATELET  PROTIME-INR  BLOOD GAS, VENOUS  BRAIN NATRIURETIC PEPTIDE  TROPONIN I (HIGH SENSITIVITY)    EKG None  Radiology No results found.  Procedures Procedures  {Document cardiac monitor, telemetry assessment procedure when appropriate:1}  Medications Ordered in ED Medications  ceFEPIme (MAXIPIME) 1 g in sodium chloride 0.9 % 100 mL IVPB (has no administration in time range)  acetaminophen (TYLENOL) tablet 1,000 mg (1,000 mg Oral Given 09/14/23 1613)    ED Course/ Medical Decision Making/ A&P Clinical Course as of 09/14/23 1911  Sat Sep 14, 2023  1832 Consulted hospitalist Dr. [name] regarding the patient who agreed with admission and assume further treatment/care. [CR]    Clinical Course User Index [CR] Peter Garter, PA   {   Click here for ABCD2, HEART and other calculatorsREFRESH Note before signing :1}                              Medical Decision Making Amount and/or Complexity of Data Reviewed Labs: ordered.  Risk OTC drugs. Prescription drug management. Decision regarding hospitalization.   This patient presents to the ED for concern of shortness of breath, this involves an extensive number of treatment  options, and is a complaint that carries with it a high risk of complications and morbidity.  The differential diagnosis includes The causes for shortness of breath include but are not limited to Cardiac (AHF, pericardial effusion and tamponade, arrhythmias, ischemia, etc) Respiratory (COPD, asthma, pneumonia, pneumothorax, primary pulmonary hypertension, PE/VQ mismatch) Hematological (anemia)  Co morbidities that complicate the patient evaluation  See HPI   Additional history obtained:  Additional history obtained from EMR External records from outside source obtained and reviewed including hospital records   Lab Tests:  I Ordered, and personally interpreted labs.  The pertinent results include:  ***   Imaging Studies ordered:  I ordered imaging studies including chest x-ray I independently visualized and interpreted imaging which showed *** I agree with the radiologist interpretation   Cardiac Monitoring: / EKG:  The patient was maintained on a cardiac monitor.  I personally viewed and interpreted the cardiac monitored which showed an underlying rhythm of: ***   Consultations Obtained:  I requested consultation with the ***,  and discussed lab and imaging findings as well as pertinent plan - they recommend: ***   Problem List / ED Course / Critical interventions / Medication management  *** I ordered medication including ***  for ***  Reevaluation of the patient after these medicines showed that the patient {resolved/improved/worsened:23923::"improved"} I have reviewed the patients home medicines and have made adjustments as needed   Social Determinants of Health:  ***   Test / Admission - Considered:  Vitals signs significant for ***. Otherwise within normal range and stable throughout visit. Laboratory/imaging studies significant for: *** *** Worrisome signs and symptoms were discussed with the patient, and the patient acknowledged understanding to return  to the ED if noticed. Patient was stable upon discharge.    {Document critical care time when appropriate:1} {Document review of labs and clinical decision tools ie heart score, Chads2Vasc2 etc:1}  {Document your independent review of radiology images, and any outside records:1} {Document your discussion with family members, caretakers, and with consultants:1} {Document social determinants  of health affecting pt's care:1} {Document your decision making why or why not admission, treatments were needed:1} Final Clinical Impression(s) / ED Diagnoses Final diagnoses:  None    Rx / DC Orders ED Discharge Orders     None

## 2023-09-14 NOTE — ED Notes (Signed)
Packed ice packs around pt  Pt alert Breathing rapidly

## 2023-09-14 NOTE — Assessment & Plan Note (Addendum)
Severe sepsis with elevated lactic acid, present on admission.   Likely source of infection I suspect is her HD tunneled cathter.  Plan to admit to step down unit. Broad spectrum antibiotic therapy with IV vancomycin and IV cefepime/ Follow up cultures, cell count and temperature curve.  Consult nephrology, likely will need removal of catheter.  Check echocardiogram, transthoracic rule out vegetations.  Close follow up on blood pressure, low threshold to start on vasopressors.   Acute on chronic hypoxemic and hypercapnic respiratory failure. (Sepsis related).  Continue supplemental 02 per [place].  Close follow up on pH, may need non invasive mechanical ventilation.

## 2023-09-14 NOTE — Assessment & Plan Note (Addendum)
Clinically patient with no signs of volume overload.  Patient with no hyperkalemia or severe acidosis.  No current indication for urgent HD.  Plan to continue cinacalcet and sevelamer for metabolic bone disease.   Anemia of chronic renal disease, with Hgb at 8,2  No indication for PRBC transfusion.

## 2023-09-14 NOTE — Assessment & Plan Note (Addendum)
Hold blood pressure agents due to risk of hypotension.  At home patient on irbesartan, nifedipine and labetalol.

## 2023-09-14 NOTE — Assessment & Plan Note (Signed)
Calculated BMI 43.4

## 2023-09-14 NOTE — ED Triage Notes (Signed)
Pt with SOB since yesterday and on home O2 at 2 L/M. Emesis on Thursday.  +HA, chills, weakness. Denies any sick contacts. Took tylenol this morning.

## 2023-09-14 NOTE — Assessment & Plan Note (Signed)
Hold on oral antihyperglycemic agents and basal insulin.  Add insulin sliding scale for glucose cover and monitoring.

## 2023-09-14 NOTE — ED Notes (Signed)
VBG Po2 less than 31  Informed MD

## 2023-09-14 NOTE — H&P (Addendum)
History and Physical    PatientTarla [name] ZOX:[id number] DOB: 12-09-1971 DOA: 09/14/2023 DOS: the patient was seen and examined on 09/14/2023 PCP: [name], PA-C  Patient coming from: Home  Chief Complaint:  Chief Complaint  Patient presents with   Shortness of Breath   HPI: Patricia [name] is a 52 y.o. female with medical history significant of ESRD on hemodialysis, (M,W,F), obesity class 3, T2DM, hypertension and asthma who presented with dyspnea, fevers and left neck pain.   Patient has a left upper extremity fistula that has been malfunctioning, she underwent Fistulogram on 08.27.24 by Dr [name] and then had a follow up with with vascular surgery in [facility] 09/03/23, plan for further follow up with vascular ultrasound.  In the meanwhile she has been getting HD through a tunneled catheter in her left internal jugular vein.   Patient was noted to have a low grade fever on Wednesday, 09/25 during dialysis. She was treated with acetaminophen and completed HD. During that session, initially her left upper extremity fistula was used but she developed pain and discomfort, not able to complete HD by this route, then completed her treatment through her HD catheter.   The following day she felt a little better, but still not back to baseline, she continue having chills and generalized malaise.  Yesterday she had several episodes of vomiting prior to HD. She was medicated with ondansetron, acetaminophen and lomotil. She was able to complete her HD treatment through her HD catheter.   At home she continue to have chills and now developed pain in her left shoulder area, radiating into her head and left ear. No improving factors.   Review of Systems: As mentioned in the history of present illness. All other systems reviewed and are negative. Past Medical History:  Diagnosis Date   Anemia    Ankle fracture    Arthritis    Asthma    Blood transfusion without reported diagnosis    Breast  cancer (HCC)    Cancer (HCC)    COVID    Diabetes mellitus without complication (HCC)    Dyspnea    End stage renal disease on dialysis (HCC)    M/W/F [facility] in [place]   GERD (gastroesophageal reflux disease)    Hypertension    OSA (obstructive sleep apnea)    uses CPAP sometimes   Pneumonia    PONV (postoperative nausea and vomiting)    Wears glasses    Past Surgical History:  Procedure Laterality Date   A/V FISTULAGRAM Left 08/13/2023   Procedure: A/V Fistulagram;  Surgeon: Nada Libman, MD;  [facility];  Service: Cardiovascular;  Laterality: Left;   ABDOMINAL HYSTERECTOMY     APPLICATION OF WOUND VAC Left 05/26/2023   Procedure: APPLICATION OF WOUND VAC;  Surgeon: Victorino Sparrow, MD;  Location: [facility] OR;  Service: Vascular;  Laterality: Left;   AV FISTULA PLACEMENT  11/2014   at [facility] hospital   AV FISTULA REPAIR N/A 04/2021   BALLOON DILATION N/A 07/10/2016   Procedure: BALLOON DILATION;  Surgeon: West Bali, MD;  Location: [facility] ENDO SUITE;  Service: Endoscopy;  Laterality: N/A;  Pyloric dilation   BREAST LUMPECTOMY     CATARACT EXTRACTION W/PHACO Right 05/24/2023   Procedure: CATARACT EXTRACTION PHACO AND INTRAOCULAR LENS PLACEMENT (IOC);  Surgeon: [name], MD;  Location: [facility] ORS;  Service: Ophthalmology;  Laterality: Right;  CDE: 5.00   CESAREAN SECTION     CHOLECYSTECTOMY     COLONOSCOPY  02/2023   COLONOSCOPY WITH PROPOFOL N/A 09/27/2016   Dr. Jena Gauss: Internal hemorrhoids repeat colonoscopy in 10 years   COLONOSCOPY WITH PROPOFOL N/A 02/28/2023   Procedure: COLONOSCOPY WITH PROPOFOL;  Surgeon: Corbin Ade, MD;  Location: [facility];  Service: Endoscopy;  Laterality: N/A;  11:45 am, asa 3 dialysis pt   DILATION AND CURETTAGE OF UTERUS     ESOPHAGOGASTRODUODENOSCOPY N/A 07/10/2016   Dr.Fields- normal esophagus, gastric stenosis was found at the pylorus, gastritis on bx, normal examined duodenun   ESOPHAGOGASTRODUODENOSCOPY (EGD) WITH  PROPOFOL N/A 11/21/2022   Procedure: ESOPHAGOGASTRODUODENOSCOPY (EGD) WITH PROPOFOL;  Surgeon: Dolores Frame, MD;  [facility]: [facility];  Service: Gastroenterology;  Laterality: N/A;   EXTERNAL FIXATION REMOVAL Right 10/29/2018   Procedure: REMOVAL RIGHT ANKLE BIOMET ZIMMER EXTERNAL FIXATOR, SHORT LEG CAST APPLICATION;  Surgeon: Eldred Manges, MD;  Location: [facility] OR;  Service: Orthopedics;  Laterality: Right;   INSERTION OF DIALYSIS CATHETER  05/26/2023   Procedure: INSERTION OF Left internal jugular DIALYSIS CATHETER;  Surgeon: Victorino Sparrow, MD;  Location: [facility] OR;  Service: Vascular;;   IR FLUORO GUIDE CV LINE LEFT  08/28/2023   IR US GUIDE VASC ACCESS LEFT  08/28/2023   MASTECTOMY     left sided   ORIF ANKLE FRACTURE Right 10/06/2018   Procedure: OPEN REDUCTION INTERNAL FIXATION (ORIF) RIGHT ANKLE TRIMALLEOLAR;  Surgeon: Eldred Manges, MD;  Location: [facility] OR;  Service: Orthopedics;  Laterality: Right;   PERIPHERAL VASCULAR BALLOON ANGIOPLASTY  08/13/2023   Procedure: PERIPHERAL VASCULAR BALLOON ANGIOPLASTY;  Surgeon: Nada Libman, MD;  [facility];  Service: Cardiovascular;;   REVISON OF ARTERIOVENOUS FISTULA Left 05/26/2023   Procedure: REVISON OF ARTERIOVENOUS FISTULA LEFT ARM HEMATOMA WASH OUT;  Surgeon: Victorino Sparrow, MD;  Location: [facility] OR;  Service: Vascular;  Laterality: Left;   RIGHT/LEFT HEART CATH AND CORONARY ANGIOGRAPHY N/A 11/27/2022   Procedure: RIGHT/LEFT HEART CATH AND CORONARY ANGIOGRAPHY;  Surgeon: [place], Peter M, MD;  Location: [facility] INVASIVE CV LAB;  Service: Cardiovascular;  Laterality: N/A;   Social History:  reports that she has never smoked. She has never used smokeless tobacco. She reports that she does not drink alcohol and does not use drugs.  Allergies  Allergen Reactions   Amlodipine Besylate Rash and Other (See Comments)    dizziness   Reglan [Metoclopramide] Other (See Comments)    hallucinations     Family History  Problem  Relation Age of Onset   Diabetes Mellitus II Mother    Hypertension Mother    Heart block Mother    Hypertension Sister    Hypertension Sister    Colon cancer Neg Hx     Prior to Admission medications   Medication Sig Start Date End Date Taking? Authorizing Provider  ALPRAZolam Prudy Feeler) 0.5 MG tablet Take 0.5 mg by mouth at bedtime as needed. 07/19/23  Yes [provider]  cetirizine (ZYRTEC) 10 MG tablet Take 1 tablet (10 mg total) by mouth daily. 06/21/23  Yes Coralyn Helling, MD  glipiZIDE (GLUCOTROL) 5 MG tablet Take 1 tablet (5 mg total) by mouth daily before breakfast. 05/21/23  Yes Nida, Denman George, MD  insulin degludec (TRESIBA FLEXTOUCH) 200 UNIT/ML FlexTouch Pen Inject 20 Units into the skin at bedtime. 08/06/23  Yes Roma Kayser, MD  ipratropium (ATROVENT) 0.06 % nasal spray Place 2 sprays into both nostrils daily.   Yes [provider]  irbesartan (AVAPRO) 75 MG tablet Take 75 mg by  mouth daily. 01/18/23  Yes [provider]  labetalol (NORMODYNE) 100 MG tablet Take 100 mg by mouth 2 (two) times daily.   Yes [provider]  montelukast (SINGULAIR) 10 MG tablet Take 1 tablet (10 mg total) by mouth at bedtime. 04/22/23  Yes Coralyn Helling, MD  multivitamin (RENA-VIT) TABS tablet Take 1 tablet by mouth daily.   Yes [provider]  NIFEdipine (ADALAT CC) 30 MG 24 hr tablet Take 30 mg by mouth at bedtime. 11/26/21  Yes [provider]  nitroGLYCERIN (NITROSTAT) 0.4 MG SL tablet Place 1 tablet (0.4 mg total) under the tongue every 5 (five) minutes as needed for chest pain. 11/28/22 11/28/23 Yes Rai, Ripudeep K, MD  pantoprazole (PROTONIX) 40 MG tablet TAKE 1 TABLET (40 MG TOTAL) BY MOUTH DAILY 30 MINUTES BEFORE BREAKFAST Patient taking differently: Take 40 mg by mouth daily. 30 minutes before breakfast 01/10/23  Yes Rourk, Gerrit Friends, MD  rOPINIRole (REQUIP XL) 2 MG 24 hr tablet Take 2 mg by mouth at bedtime.  09/10/16  Yes [provider]  rosuvastatin (CRESTOR) 20 MG tablet Take 20 mg by mouth daily.   Yes [provider]  acetaminophen (TYLENOL) 325 MG tablet Take 650 mg by mouth every 6 (six) hours as needed for mild pain or fever.    [provider]  albuterol (VENTOLIN HFA) 108 (90 Base) MCG/ACT inhaler Inhale 2 puffs into the lungs every 6 (six) hours as needed for wheezing or shortness of breath. 01/15/23   Parrett, Virgel Bouquet, NP  aspirin EC 81 MG tablet Take 1 tablet (81 mg total) by mouth daily. Swallow whole. 12/07/21   Pricilla Riffle, MD  Carboxymethylcell-Glycerin PF (REFRESH OPTIVE PF) 0.5-0.9 % SOLN Place 1 drop into both eyes as needed (dry eyes).    [provider]  cinacalcet (SENSIPAR) 30 MG tablet Take 30 mg by mouth daily.    [provider]  docusate sodium (COLACE) 100 MG capsule Take 100 mg by mouth daily.    [provider]  ferric citrate (AURYXIA) 1 GM 210 MG(Fe) tablet Take 420 mg by mouth 2 (two) times daily with a meal. With Breakfast & with supper    [provider]  linaclotide (LINZESS) 290 MCG CAPS capsule Take 290 mcg by mouth daily before breakfast.    [provider]  ondansetron (ZOFRAN-ODT) 4 MG disintegrating tablet Take 1 tablet (4 mg total) by mouth every 8 (eight) hours as needed for nausea or vomiting. 02/19/23   Gelene Mink, NP  OXYGEN Inhale 2 L into the lungs continuous.    [provider]  sevelamer carbonate (RENVELA) 800 MG tablet Take 1,600-2,400 mg by mouth See admin instructions. 1600 mg at breakfast, and 2400 mg at lunch and 1600 mg at dinner    [provider]  sodium chloride (OCEAN) 0.65 % SOLN nasal spray Place 1 spray into both nostrils as needed for congestion. 01/02/20   Elease Etienne, MD    Physical Exam: Vitals:   09/14/23 1803 09/14/23 1804 09/14/23 1848 09/14/23 1854  BP:   (!) 111/59   Pulse: 99  96   Resp: 18  (!) 23   Temp:  (!) 103 F (39.4 C)  (!) 103 F (39.4 C)   TempSrc:  Oral  Oral  SpO2: 93%  95%   Weight:      Height:       On 4 L/min supplemental 02 per [place]. RR 37  Neurology awake and alert, deconditioned and ill looking appearing ENT with mild pallor Cardiovascular with S1 and S2 present and regular with no gallops, rubs or murmurs Respiratory with no rales or wheezing, no rhonchi Abdomen with no distention  No lower extremity edema,  Site of the tunneled cathter tender to palpation and increased local temperature, no erythema or drainage.   Data Reviewed:   VBG pH 7,29, pCO2 51, bicarbonate 24.3  Na 129, K 3,6 Cl 90, bicarbonate 20, glucose 257 bun 44 cr 10,3  BNP 296 High sensitive troponin 58  Lactic acid 2,9  Wbc 22.0 hgb 8,2 plt 187  Sars covid 19 negative   Chest radiograph with mild cardiomegaly, mild hilar vascular congestion, no effusions or infiltrates. HD catheter in the left internal jugular vain with tip at the SVC right atrium junction.   EKG 102 bpm, normal axis, normal intervals, sinus rhythm with no significant ST segment or T wave changes.   Assessment and Plan: * Severe sepsis (HCC) Severe sepsis with elevated lactic acid, present on admission.   Likely source of infection I suspect is her HD tunneled cathter.  Plan to admit to step down unit. Broad spectrum antibiotic therapy with IV vancomycin and IV cefepime/ Follow up cultures, cell count and temperature curve.  Consult nephrology, likely will need removal of catheter.  Check echocardiogram, transthoracic rule out vegetations.  Close follow up on blood pressure, low threshold to start on vasopressors.   Acute on chronic hypoxemic and hypercapnic respiratory failure. (Sepsis related).  Continue supplemental 02 per [place].  Close follow up on pH, may need non invasive mechanical ventilation.   ESRD on dialysis [facility]) Clinically patient with no signs of volume overload.  Patient with no hyperkalemia or severe acidosis.  No current indication for urgent  HD.  Plan to continue cinacalcet and sevelamer for metabolic bone disease.   Anemia of chronic renal disease, with Hgb at 8,2  No indication for PRBC transfusion.   Essential hypertension Hold blood pressure agents due to risk of hypotension.  At home patient on irbesartan, nifedipine and labetalol.   GERD (gastroesophageal reflux disease) Continue with PPI   Type 2 diabetes mellitus with hyperlipidemia (HCC) Hold on oral antihyperglycemic agents and basal insulin.  Add insulin sliding scale for glucose cover and monitoring.   Class 3 obesity (HCC) Calculated BMI 43.4      Advance Care Planning:   Code Status: Full Code   Consults: Nephrology   Family Communication: I spoke with patient's son at the bedside, we talked in detail about patient's condition, plan of care and prognosis and all questions were addressed.   Severity of Illness: The appropriate patient status for this patient is INPATIENT. Inpatient status is judged to be reasonable and necessary in order to provide the required intensity of service to ensure the patient's safety. The patient's presenting symptoms, physical exam findings, and initial radiographic and laboratory data in the context of their chronic comorbidities is felt to place them at high risk for further clinical deterioration. Furthermore, it is not anticipated that the patient will be medically stable for discharge from the hospital within 2 midnights of admission.   * I certify that at the point of admission it is my clinical judgment that the patient will require inpatient hospital care spanning beyond 2 midnights from the point of admission due to high intensity of service, high risk for further deterioration and high frequency of surveillance required.*  Author: Coralie Keens, MD 09/14/2023 7:01  PM  For on call review [email or web address].

## 2023-09-14 NOTE — ED Notes (Signed)
Called to given report RN Inetta Fermo unavailable will call back  [facility] Tim going to ICU to have cooling blanket set up for pt prior to her being brought to the floor.

## 2023-09-14 NOTE — ED Notes (Signed)
Lactic 2.9 Informed MD

## 2023-09-14 NOTE — Assessment & Plan Note (Signed)
-   Continue with PPI 

## 2023-09-14 NOTE — ED Notes (Signed)
Introduced self to pt Pt stated she has been SOB with HA and blurred vision x2days Pt complains of fever, took tylenol at home Triage gave 1000mg  APAP for temp over 103  Pain 6/10 Starts on the LEFT side of neck from dialysis port radiates up to head. Triage place pt on O2 at 4LPM pt maintaining mid 90's 2 IV's started All labs sent UA canceled per PA, pt stated she does not make any urine  ESRD Dialysis MWF. Went yesterday, had full tx.

## 2023-09-15 ENCOUNTER — Other Ambulatory Visit (HOSPITAL_COMMUNITY): Payer: Self-pay | Admitting: *Deleted

## 2023-09-15 ENCOUNTER — Inpatient Hospital Stay (HOSPITAL_COMMUNITY): Payer: Medicare Other

## 2023-09-15 DIAGNOSIS — A419 Sepsis, unspecified organism: Secondary | ICD-10-CM | POA: Diagnosis not present

## 2023-09-15 DIAGNOSIS — B9561 Methicillin susceptible Staphylococcus aureus infection as the cause of diseases classified elsewhere: Secondary | ICD-10-CM

## 2023-09-15 DIAGNOSIS — I10 Essential (primary) hypertension: Secondary | ICD-10-CM | POA: Diagnosis not present

## 2023-09-15 DIAGNOSIS — R509 Fever, unspecified: Secondary | ICD-10-CM | POA: Diagnosis not present

## 2023-09-15 DIAGNOSIS — N186 End stage renal disease: Secondary | ICD-10-CM | POA: Diagnosis not present

## 2023-09-15 LAB — GLUCOSE, CAPILLARY
Glucose-Capillary: 210 mg/dL — ABNORMAL HIGH (ref 70–99)
Glucose-Capillary: 241 mg/dL — ABNORMAL HIGH (ref 70–99)
Glucose-Capillary: 203 mg/dL — ABNORMAL HIGH (ref 70–99)
Glucose-Capillary: 209 mg/dL — ABNORMAL HIGH (ref 70–99)

## 2023-09-15 LAB — BASIC METABOLIC PANEL
Anion gap: 20 — ABNORMAL HIGH (ref 5–15)
BUN: 57 mg/dL — ABNORMAL HIGH (ref 6–20)
CO2: 20 mmol/L — ABNORMAL LOW (ref 22–32)
Calcium: 9.1 mg/dL (ref 8.9–10.3)
Chloride: 89 mmol/L — ABNORMAL LOW (ref 98–111)
Creatinine, Ser: 11.65 mg/dL — ABNORMAL HIGH (ref 0.44–1.00)
GFR, Estimated: 4 mL/min — ABNORMAL LOW (ref >60)
Glucose, Bld: 215 mg/dL — ABNORMAL HIGH (ref 70–99)
Potassium: 4.2 mmol/L (ref 3.5–5.1)
Sodium: 129 mmol/L — ABNORMAL LOW (ref 135–145)

## 2023-09-15 LAB — BLOOD CULTURE ID PANEL (REFLEXED) - BCID2

## 2023-09-15 LAB — ECHOCARDIOGRAM COMPLETE
AR max vel: 1.84 cm2
AV Area VTI: 1.93 cm2
AV Area mean vel: 1.91 cm2
AV Mean grad: 10.3 mm[Hg]
AV Peak grad: 21.8 mm[Hg]
Ao pk vel: 2.34 m/s
Area-P 1/2: 2.42 cm2
Height: 63 in
S' Lateral: 2.5 cm
Weight: 3920 [oz_av]

## 2023-09-15 LAB — CBC
HCT: 32.2 % — ABNORMAL LOW (ref 36.0–46.0)
Hemoglobin: 9.9 g/dL — ABNORMAL LOW (ref 12.0–15.0)
MCH: 28.4 pg (ref 26.0–34.0)
MCHC: 30.7 g/dL (ref 30.0–36.0)
MCV: 92.5 fL (ref 80.0–100.0)
Platelets: 162 10*3/uL (ref 150–400)
RBC: 3.48 MIL/uL — ABNORMAL LOW (ref 3.87–5.11)
RDW: 15 % (ref 11.5–15.5)
WBC: 22.1 10*3/uL — ABNORMAL HIGH (ref 4.0–10.5)
nRBC: 0 % (ref 0.0–0.2)

## 2023-09-15 LAB — HEMOGLOBIN A1C
Hgb A1c MFr Bld: 8.5 % — ABNORMAL HIGH (ref 4.8–5.6)
Mean Plasma Glucose: 197.25 mg/dL

## 2023-09-15 MED ORDER — SEVELAMER CARBONATE 800 MG PO TABS
2400.0000 mg | ORAL_TABLET | Freq: Every day | ORAL | Status: DC
  Administered 2023-09-16 – 2023-09-24 (×7): 2400 mg via ORAL
  Filled 2023-09-15 (×7): qty 3

## 2023-09-15 MED ORDER — CEFAZOLIN SODIUM-DEXTROSE 1-4 GM/50ML-% IV SOLN
1.0000 g | INTRAVENOUS | Status: DC
  Administered 2023-09-15 – 2023-09-23 (×8): 1 g via INTRAVENOUS
  Filled 2023-09-15 (×14): qty 50

## 2023-09-15 MED ORDER — SEVELAMER CARBONATE 800 MG PO TABS
1600.0000 mg | ORAL_TABLET | Freq: Two times a day (BID) | ORAL | Status: DC
  Administered 2023-09-15 – 2023-09-23 (×15): 1600 mg via ORAL
  Filled 2023-09-15 (×17): qty 2

## 2023-09-15 MED ORDER — CHLORHEXIDINE GLUCONATE CLOTH 2 % EX PADS
6.0000 | MEDICATED_PAD | Freq: Every day | CUTANEOUS | Status: DC
  Administered 2023-09-15 – 2023-09-22 (×9): 6 via TOPICAL

## 2023-09-15 MED ORDER — SODIUM CHLORIDE 0.9 % IV BOLUS
250.0000 mL | Freq: Once | INTRAVENOUS | Status: AC
  Administered 2023-09-15: 250 mL via INTRAVENOUS

## 2023-09-15 MED ORDER — CINACALCET HCL 30 MG PO TABS
30.0000 mg | ORAL_TABLET | Freq: Every day | ORAL | Status: DC
  Administered 2023-09-16 – 2023-09-22 (×5): 30 mg via ORAL
  Filled 2023-09-15 (×7): qty 1

## 2023-09-15 MED ORDER — SALINE SPRAY 0.65 % NA SOLN
1.0000 | NASAL | Status: DC | PRN
  Administered 2023-09-15: 1 via NASAL
  Filled 2023-09-15: qty 44

## 2023-09-15 NOTE — Progress Notes (Signed)
Pharmacy Antibiotic Note  Patricia [name] is a 52 y.o. female admitted on 09/14/2023 with bacteremia.  Patient has a past medical history of HTN, DM2 and ESRD on hemodialysis. Pharmacy has been consulted to transition patient from vancomycin to cefazolin.  Plan: Day 2 of antibiotics Start cefazolin 1 g IV q24h Continue to follow culture results    Height: 5\' 3"  (160 cm) Weight: 111.1 kg (245 lb) IBW/kg (Calculated) : 52.4  Temp (24hrs), Avg:100.8 F (38.2 C), Min:98.1 F (36.7 C), Max:103.1 F (39.5 C)  Recent Labs  Lab 09/14/23 1716 09/14/23 1842 09/15/23 0611  WBC 22.0*  --  22.1*  CREATININE 10.30*  --  11.65*  LATICACIDVEN 2.9* 1.4  --     Estimated Creatinine Clearance: 6.7 mL/min (A) (by C-G formula based on SCr of 11.65 mg/dL (H)).    Allergies  Allergen Reactions   Norvasc [Amlodipine Besylate] Rash and Other (See Comments)    Dizziness    Reglan [Metoclopramide] Other (See Comments)    Hallucinations     Antimicrobials this admission: 9/28 Vanc >> 9/29 9/28 Cefepime >> 9/29 9/29 Cefazolin>>  Dose adjustments this admission: None  Microbiology results: 9/28 BC- gram positive cocci; BCID Staph aureus   Thank you for allowing pharmacy to be a part of this patient's care.  Merryl Hacker, PharmD Clinical Pharmacist 09/15/2023 4:52 PM

## 2023-09-15 NOTE — Progress Notes (Signed)
*  PRELIMINARY RESULTS* Echocardiogram 2D Echocardiogram has been performed.  Stacey Drain 09/15/2023, 10:49 AM

## 2023-09-15 NOTE — Progress Notes (Signed)
Critical blood culture lab anaerobic bottles both cultures gram positive cocci

## 2023-09-15 NOTE — Progress Notes (Signed)
Progress Note   PatientDimitra [name] MVH:[id number] DOB: [birth year] DOA: 09/14/2023     1 DOS: the patient was seen and examined on 09/15/2023   Brief hospital admission narrative: As per H&P written by Dr. [name] on 09/14/2023 Patricia [name] is a 52 y.o. female with medical history significant of ESRD on hemodialysis, (M,W,F), obesity class 3, T2DM, hypertension and asthma who presented with dyspnea, fevers and left neck pain.    Patient has a left upper extremity fistula that has been malfunctioning, she underwent Fistulogram on 08.27.24 by Dr [name] and then had a follow up with with vascular surgery in [facility] 09/03/23, plan for further follow up with vascular ultrasound.  In the meanwhile she has been getting HD through a tunneled catheter in her left internal jugular vein.    Patient was noted to have a low grade fever on Wednesday, 09/25 during dialysis. She was treated with acetaminophen and completed HD. During that session, initially her left upper extremity fistula was used but she developed pain and discomfort, not able to complete HD by this route, then completed her treatment through her HD catheter.    The following day she felt a little better, but still not back to baseline, she continue having chills and generalized malaise.  Yesterday she had several episodes of vomiting prior to HD. She was medicated with ondansetron, acetaminophen and lomotil. She was able to complete her HD treatment through her HD catheter.    At home she continue to have chills and now developed pain in her left shoulder area, radiating into her head and left ear. No improving factors.   Assessment and Plan: * Severe sepsis secondary to MSSA bacteremia (HCC) -Severe sepsis with elevated lactic acid, present on admission.  -Follow 2D echo -Appreciate ID service recommendation -Continue IV antibiotics at the moment Ancef -Continue supportive care and follow clinical response -Patient will need exchange of  dialysis catheter (after discussing with ID that something that can be done after dialysis tomorrow). -Stable vital signs.  Acute on chronic hypoxemic and hypercapnic respiratory failure. (Sepsis related).  -Continue supplemental 02 per [place].  -Continue CPAP nightly.  ESRD on dialysis [facility]) Clinically patient with no signs of volume overload and is stable electrolytes at the moment -Patient chronically Monday, Wednesday and Friday for dialysis purposes -Last treatment on 09/13/2023 -Nephrology service consulted for dialysis continuation.  Essential hypertension -Currently stable -Follow blood pressure and adjust antihypertensive regimen as needed.  GERD (gastroesophageal reflux disease) -Continue with PPI   Type 2 diabetes mellitus with hyperlipidemia (HCC) -Continue sliding scale insulin and follow CBGs fluctuation with further adjustment to hypoglycemic regimen as required.  Class 3 obesity (HCC) -Body mass index is 43.4 kg/m. -Low-calorie diet, portion control and increase physical activity discussed with patient.  OSA -Continue CPAP nightly.   Subjective:  Spiking fever; no chest pain, no nausea vomiting.  Morbidly obese, intermittently somnolent and expressing not feeling well.  2 L nasal cannula in place with good saturation.  Physical Exam: Vitals:   09/15/23 1500 09/15/23 1530 09/15/23 1552 09/15/23 1625  BP: 100/72 (!) 147/77  (!) 135/59  Pulse: 82 84  86  Resp: (!) 22 (!) 21  (!) 24  Temp:   98.1 F (36.7 C)   TempSrc:   Axillary   SpO2: 93% 92%  92%  Weight:      Height:       General exam: Alert, awake, oriented x 3; somnolent; obese and chronically ill in appearance.  Fever appreciated  overnight.  Expressing not feeling well. Respiratory system: Decreased breath sounds at the bases; no using accessory muscle.  2 L nasal cannula in place. Cardiovascular system:RRR. No rubs or gallops; no JVD. Gastrointestinal system: Abdomen is obese, nondistended, soft and  nontender. No organomegaly or masses felt. Normal bowel sounds heard. Central nervous system: Alert and oriented. No focal neurological deficits. Extremities: No cyanosis or clubbing; trace edema appreciated bilaterally. Skin: No petechiae; left HD catheter (subclavian area) without purulence or tenderness to palpation. Psychiatry: Judgement and insight appear normal. Mood & affect appropriate.   Data Reviewed: CBC: WBCs 22.1, hemoglobin 9.9 and platelet count 1 62K Basic metabolic panel: Sodium 129, potassium 4.2, glucose 215, chloride 89, bicarb 20, BUN 57, creatinine 11.65 and GFR 4 A1c: 8.5  Family Communication: No family at bedside.  Disposition: Status is: Inpatient Remains inpatient appropriate because: Continue IV antibiotics.   Planned Discharge Destination: Home  Time spent: 50 minutes  Author: Vassie Loll, MD 09/15/2023 5:04 PM  For on call review [email or web address].

## 2023-09-15 NOTE — Progress Notes (Signed)
Patient placed on nasal cpap with auto titrate setting and 3L oxygen.  Patient tolerating well at this time.  O2 Sat 99%

## 2023-09-15 NOTE — Progress Notes (Signed)
INFECTIOUS DISEASE ATTENDING ADDENDUM:   Date: 09/15/2023  Patient name: Patricia [name]  Medical record number: [id number]  Date of birth: 11/28/1971        [facility] Antimicrobial Management Team Staphylococcus aureus bacteremia   Staphylococcus aureus bacteremia (SAB) is associated with a high rate of complications and mortality.  Specific aspects of clinical management are critical to optimizing the outcome of patients with SAB.  Therefore, the [facility] Antimicrobial Management Team [facility]) has initiated an intervention aimed at improving the management of SAB at [facility].  To do so, Infectious Diseases physicians are providing an evidence-based consult for the management of all patients with SAB.     Yes No Comments  Perform follow-up blood cultures (even if the patient is afebrile) to ensure clearance of bacteremia [x]  []  Will repeat tomorrow and will need after HD removal  Remove vascular catheter and obtain follow-up blood cultures after the removal of the catheter [x]  []  Needs HD catheter holiday  Perform echocardiography to evaluate for endocarditis (transthoracic ECHO is 40-50% sensitive, TEE is > 90% sensitive) [x]  []  Please keep in mind, that neither test can definitively EXCLUDE endocarditis, and that should clinical suspicion remain high for endocarditis the patient should then still be treated with an "endocarditis" duration of therapy = 6 weeks  Consult electrophysiologist to evaluate implanted cardiac device (pacemaker, ICD) []  []    Ensure source control [x]  []  Have all abscesses been drained effectively? Have deep seeded infections (septic joints or osteomyelitis) had appropriate surgical debridement?  Investigate for "metastatic" sites of infection [x]  []  Does the patient have ANY symptom or physical exam finding that would suggest a deeper infection (back or neck pain that may be suggestive of vertebral osteomyelitis or epidural abscess, muscle pain that  could be a symptom of pyomyositis)?  Keep in mind that for deep seeded infections MRI imaging with contrast is preferred rather than other often insensitive tests such as plain x-rays, especially early in a patient's presentation.  Change antibiotic therapy to  Cefazolin [x]  []  Beta-lactam antibiotics are preferred for MSSA due to higher cure rates.   If on Vancomycin, goal trough should be 15 - 20 mcg/mL  Estimated duration of IV antibiotic therapy:  6 weeks []  []  Consult case management for probably prolonged outpatient IV antibiotic therapy   Case discussed with Dr. Gwenlyn Perking. She will likely need HD catheter holiday  Narrowing to ancef.   Dr. Thedore Mins is on outside tomorrow.  NOTE I have NO LONGER BEEN ABLE TO GET CAREGILITY TO WORK WITH ICU CAMERAS SO NOT SUR WE CAN DO TELE CONSULTS, but rather just give phone advice    Acey Lav 09/15/2023, 4:48 PM

## 2023-09-16 ENCOUNTER — Inpatient Hospital Stay (HOSPITAL_COMMUNITY): Payer: Medicare Other

## 2023-09-16 DIAGNOSIS — R7881 Bacteremia: Secondary | ICD-10-CM | POA: Diagnosis not present

## 2023-09-16 DIAGNOSIS — N186 End stage renal disease: Secondary | ICD-10-CM | POA: Diagnosis not present

## 2023-09-16 DIAGNOSIS — I10 Essential (primary) hypertension: Secondary | ICD-10-CM | POA: Diagnosis not present

## 2023-09-16 DIAGNOSIS — E1169 Type 2 diabetes mellitus with other specified complication: Secondary | ICD-10-CM | POA: Diagnosis not present

## 2023-09-16 DIAGNOSIS — Z992 Dependence on renal dialysis: Secondary | ICD-10-CM | POA: Diagnosis not present

## 2023-09-16 DIAGNOSIS — R6520 Severe sepsis without septic shock: Secondary | ICD-10-CM | POA: Diagnosis not present

## 2023-09-16 LAB — GLUCOSE, CAPILLARY
Glucose-Capillary: 156 mg/dL — ABNORMAL HIGH (ref 70–99)
Glucose-Capillary: 204 mg/dL — ABNORMAL HIGH (ref 70–99)
Glucose-Capillary: 134 mg/dL — ABNORMAL HIGH (ref 70–99)
Glucose-Capillary: 209 mg/dL — ABNORMAL HIGH (ref 70–99)

## 2023-09-16 LAB — RENAL FUNCTION PANEL
Albumin: 3.1 g/dL — ABNORMAL LOW (ref 3.5–5.0)
Anion gap: 23 — ABNORMAL HIGH (ref 5–15)
BUN: 88 mg/dL — ABNORMAL HIGH (ref 6–20)
CO2: 17 mmol/L — ABNORMAL LOW (ref 22–32)
Calcium: 9.2 mg/dL (ref 8.9–10.3)
Chloride: 90 mmol/L — ABNORMAL LOW (ref 98–111)
Creatinine, Ser: 14.29 mg/dL — ABNORMAL HIGH (ref 0.44–1.00)
GFR, Estimated: 3 mL/min — ABNORMAL LOW (ref >60)
Glucose, Bld: 188 mg/dL — ABNORMAL HIGH (ref 70–99)
Phosphorus: 7.6 mg/dL — ABNORMAL HIGH (ref 2.5–4.6)
Potassium: 4.2 mmol/L (ref 3.5–5.1)
Sodium: 131 mmol/L — ABNORMAL LOW (ref 135–145)

## 2023-09-16 LAB — CBC
HCT: 27.5 % — ABNORMAL LOW (ref 36.0–46.0)
Hemoglobin: 8.8 g/dL — ABNORMAL LOW (ref 12.0–15.0)
MCH: 28.5 pg (ref 26.0–34.0)
MCHC: 32 g/dL (ref 30.0–36.0)
MCV: 89 fL (ref 80.0–100.0)
Platelets: 176 10*3/uL (ref 150–400)
RBC: 3.09 MIL/uL — ABNORMAL LOW (ref 3.87–5.11)
RDW: 14.7 % (ref 11.5–15.5)
WBC: 13.1 10*3/uL — ABNORMAL HIGH (ref 4.0–10.5)
nRBC: 0 % (ref 0.0–0.2)

## 2023-09-16 LAB — HEPATITIS B SURFACE ANTIGEN: Hepatitis B Surface Ag: NONREACTIVE

## 2023-09-16 MED ORDER — ROPINIROLE HCL ER 2 MG PO TB24
2.0000 mg | ORAL_TABLET | Freq: Every day | ORAL | Status: DC
  Administered 2023-09-17 – 2023-09-23 (×5): 2 mg via ORAL
  Filled 2023-09-16 (×5): qty 1

## 2023-09-16 MED ORDER — LIDOCAINE HCL (PF) 1 % IJ SOLN
10.0000 mL | Freq: Once | INTRAMUSCULAR | Status: AC
  Administered 2023-09-16: 10 mL via INTRADERMAL

## 2023-09-16 MED ORDER — PENTAFLUOROPROP-TETRAFLUOROETH EX AERO: 1.0000 | INHALATION_SPRAY | CUTANEOUS | Status: DC | PRN

## 2023-09-16 MED ORDER — ANTICOAGULANT SODIUM CITRATE 4% (200MG/5ML) IV SOLN: 5.0000 mL | Status: DC | PRN

## 2023-09-16 MED ORDER — CHLORHEXIDINE GLUCONATE CLOTH 2 % EX PADS: 6.0000 | MEDICATED_PAD | Freq: Every day | CUTANEOUS | Status: DC

## 2023-09-16 MED ORDER — LIDOCAINE-PRILOCAINE 2.5-2.5 % EX CREA: 1.0000 | TOPICAL_CREAM | CUTANEOUS | Status: DC | PRN

## 2023-09-16 MED ORDER — HEPARIN SODIUM (PORCINE) 1000 UNIT/ML DIALYSIS
1000.0000 [IU] | INTRAMUSCULAR | Status: DC | PRN
  Administered 2023-09-19: 2800 [IU]
  Administered 2023-09-24: 3200 [IU]
  Filled 2023-09-16 (×4): qty 1

## 2023-09-16 MED ORDER — ALTEPLASE 2 MG IJ SOLR: 2.0000 mg | Freq: Once | INTRAMUSCULAR | Status: DC | PRN

## 2023-09-16 MED ORDER — LIDOCAINE HCL (PF) 1 % IJ SOLN
5.0000 mL | INTRAMUSCULAR | Status: DC | PRN
  Administered 2023-09-19: 5 mL via INTRADERMAL
  Filled 2023-09-16: qty 5

## 2023-09-16 MED ORDER — HYDROMORPHONE HCL 1 MG/ML IJ SOLN
0.5000 mg | INTRAMUSCULAR | Status: DC | PRN
  Administered 2023-09-16 – 2023-09-23 (×19): 0.5 mg via INTRAVENOUS
  Filled 2023-09-16 (×19): qty 0.5

## 2023-09-16 NOTE — Progress Notes (Signed)
ID Brief note  MSSA Bacteremia -Continue ancef -Repeat blood Cx -General surgery engaged for [facility] removal -TTE no veg, will need TEE

## 2023-09-16 NOTE — Procedures (Signed)
[facility] Procedure Note  09/16/23  Pre-procedure Diagnosis: Bacteremia, tunneled dialysis line in place    Post-procedure Diagnosis: Same   Procedure(s) Performed:  Removal of tunneled dialysis catheter, left internal jugular    Surgeon: Leatrice Jewels. [name], MD   Assistants: No qualified resident was available    Anesthesia: Lidocaine 1%    Specimens: None    Estimated Blood Loss: Minimal  Wound Class: Dirty /infected    Procedure Indications: Patricia [name] is a 52 yo with Staph aureus bacteremia who has a tunneled catheter in place that was placed by IR 08/28/23. She needs the line removed. Discussed removal and risk of bleeding, infection, needing additional dialysis access.   Findings: Catheter easily removed    Procedure: The patient was in her ICU room and placed semi upright after her dialysis. The left chest and catheter were prepared and draped in the usual sterile fashion. Lidocaine 1% was injected around the insertion site to the approximate level of the cuff.   The suture was clipped and using blunt dissection the catheter tunnel was opened up and the cuff pulled out easily. My RN assistant held pressure on the left internal jugular as I pulled out the dialysis catheter.  Pressure was held for 15 minutes.  A gauze and paper tape dressing were placed on the exit site.   Final inspection revealed acceptable hemostasis. The patient tolerated the procedure well.   She did require dilaudid 0.5 mg for the pain she was experiencing during the time the pressure was being held on her internal jugular.   Algis Greenhouse, MD [facility] [address] [place], [place] [address] [phone] (office)

## 2023-09-16 NOTE — Plan of Care (Signed)
  Problem: Education: Goal: Understanding of CV disease, CV risk reduction, and recovery process will improve Outcome: Progressing Goal: Individualized Educational Video(s) Outcome: Progressing   Problem: Activity: Goal: Ability to return to baseline activity level will improve Outcome: Progressing   Problem: Cardiovascular: Goal: Ability to achieve and maintain adequate cardiovascular perfusion will improve Outcome: Progressing Goal: Vascular access site(s) Level 0-1 will be maintained Outcome: Progressing   Problem: Health Behavior/Discharge Planning: Goal: Ability to safely manage health-related needs after discharge will improve Outcome: Progressing   Problem: Education: Goal: Knowledge of cardiac device and self-care will improve Outcome: Progressing Goal: Ability to safely manage health related needs after discharge will improve Outcome: Progressing Goal: Individualized Educational Video(s) Outcome: Progressing   Problem: Cardiac: Goal: Ability to achieve and maintain adequate cardiopulmonary perfusion will improve Outcome: Progressing   Problem: Education: Goal: Knowledge of General Education information will improve Description: Including pain rating scale, medication(s)/side effects and non-pharmacologic comfort measures Outcome: Progressing   Problem: Health Behavior/Discharge Planning: Goal: Ability to manage health-related needs will improve Outcome: Progressing   Problem: Clinical Measurements: Goal: Ability to maintain clinical measurements within normal limits will improve Outcome: Progressing Goal: Will remain free from infection Outcome: Progressing Goal: Diagnostic test results will improve Outcome: Progressing Goal: Respiratory complications will improve Outcome: Progressing Goal: Cardiovascular complication will be avoided Outcome: Progressing   Problem: Activity: Goal: Risk for activity intolerance will decrease Outcome: Progressing    Problem: Nutrition: Goal: Adequate nutrition will be maintained Outcome: Progressing   Problem: Coping: Goal: Level of anxiety will decrease Outcome: Progressing   Problem: Elimination: Goal: Will not experience complications related to bowel motility Outcome: Progressing Goal: Will not experience complications related to urinary retention Outcome: Progressing   Problem: Pain Managment: Goal: General experience of comfort will improve Outcome: Progressing   Problem: Safety: Goal: Ability to remain free from injury will improve Outcome: Progressing   Problem: Skin Integrity: Goal: Risk for impaired skin integrity will decrease Outcome: Progressing   Problem: Education: Goal: Ability to describe self-care measures that may prevent or decrease complications (Diabetes Survival Skills Education) will improve Outcome: Progressing Goal: Individualized Educational Video(s) Outcome: Progressing   Problem: Coping: Goal: Ability to adjust to condition or change in health will improve Outcome: Progressing   Problem: Fluid Volume: Goal: Ability to maintain a balanced intake and output will improve Outcome: Progressing   Problem: Health Behavior/Discharge Planning: Goal: Ability to identify and utilize available resources and services will improve Outcome: Progressing Goal: Ability to manage health-related needs will improve Outcome: Progressing   Problem: Metabolic: Goal: Ability to maintain appropriate glucose levels will improve Outcome: Progressing   Problem: Nutritional: Goal: Maintenance of adequate nutrition will improve Outcome: Progressing Goal: Progress toward achieving an optimal weight will improve Outcome: Progressing   Problem: Skin Integrity: Goal: Risk for impaired skin integrity will decrease Outcome: Progressing   Problem: Tissue Perfusion: Goal: Adequacy of tissue perfusion will improve Outcome: Progressing

## 2023-09-16 NOTE — Consult Note (Signed)
Patricia [name] Admit Date: 09/14/2023 09/16/2023 [name] Requesting Physician:  Madera MD  Reason for Consult:  ESRD, Bacteremia HPI:  62F ESRD MWF [facility] admitted on 9/28 after presenting to the ED with left neck pain, fevers, shortness of breath.  She has a left IJ HD catheter in the left upper arm AV fistula.  On 8/27 she underwent fistulogram with Dr. [name] and had a stent placed into a cephalic vein stenosis.  She had a TDC at that time.  On 9/11 she received the left IJ tunneled dialysis catheter exchange after the TDC fell out.  She had HD on 9/25 with some low-grade fevers.  And then again on 9/27 with emesis and was clearly unwell, coming to the ED.  She had progressive pain in the left neck region where her exit site is located.  In the ED she was febrile up to 103 Fahrenheit.  She was placed on vancomycin and cefepime.  Blood cultures are 2 out of 2 Staph aureus.  Methicillin sensitive and antibiotics have been transitioned to cefazolin.  She has exquisite tenderness along the tract of her tunneled catheter and across the left anterior aspect of the neck.  No tenderness in her aneurysmal and tortuous AV fistula with a bruit and thrill present.  She has defervesced.  Kasai ptosis persists, not improving yet at 22.  PMH Incudes: DM2, hypertension, asthma.  ROS Balance of 12 systems is negative w/ exceptions as above  PMH  Past Medical History:  Diagnosis Date   Anemia    Ankle fracture    Arthritis    Asthma    Blood transfusion without reported diagnosis    Breast cancer (HCC)    Cancer (HCC)    COVID    Diabetes mellitus without complication (HCC)    Dyspnea    End stage renal disease on dialysis (HCC)    M/W/F [facility] in [place]   GERD (gastroesophageal reflux disease)    Hypertension    OSA (obstructive sleep apnea)    uses CPAP sometimes   Pneumonia    PONV (postoperative nausea and vomiting)    Wears glasses    PSH  Past Surgical History:   Procedure Laterality Date   A/V FISTULAGRAM Left 08/13/2023   Procedure: A/V Fistulagram;  Surgeon: Nada Libman, MD;  Location: [facility] INVASIVE CV LAB;  Service: Cardiovascular;  Laterality: Left;   ABDOMINAL HYSTERECTOMY     APPLICATION OF WOUND VAC Left 05/26/2023   Procedure: APPLICATION OF WOUND VAC;  Surgeon: Victorino Sparrow, MD;  Location: [facility] OR;  Service: Vascular;  Laterality: Left;   AV FISTULA PLACEMENT  11/2014   at [facility]   AV FISTULA REPAIR N/A 04/2021   BALLOON DILATION N/A 07/10/2016   Procedure: BALLOON DILATION;  Surgeon: West Bali, MD;  Location: [facility] ENDO SUITE;  Service: Endoscopy;  Laterality: N/A;  Pyloric dilation   BREAST LUMPECTOMY     CATARACT EXTRACTION W/PHACO Right 05/24/2023   Procedure: CATARACT EXTRACTION PHACO AND INTRAOCULAR LENS PLACEMENT (IOC);  Surgeon: [name], MD;  Location: [facility] ORS;  Service: Ophthalmology;  Laterality: Right;  CDE: 5.00   CESAREAN SECTION     CHOLECYSTECTOMY     COLONOSCOPY  02/2023   COLONOSCOPY WITH PROPOFOL N/A 09/27/2016   Dr. Jena Gauss: Internal hemorrhoids repeat colonoscopy in 10 years   COLONOSCOPY WITH PROPOFOL N/A 02/28/2023   Procedure: COLONOSCOPY WITH PROPOFOL;  Surgeon: Corbin Ade, MD;  Location: [facility];  Service: Endoscopy;  Laterality: N/A;  11:45 am, asa 3 dialysis pt   DILATION AND CURETTAGE OF UTERUS     ESOPHAGOGASTRODUODENOSCOPY N/A 07/10/2016   Dr.Fields- normal esophagus, gastric stenosis was found at the pylorus, gastritis on bx, normal examined duodenun   ESOPHAGOGASTRODUODENOSCOPY (EGD) WITH PROPOFOL N/A 11/21/2022   Procedure: ESOPHAGOGASTRODUODENOSCOPY (EGD) WITH PROPOFOL;  Surgeon: Dolores Frame, MD;  Location: [facility];  Service: Gastroenterology;  Laterality: N/A;   EXTERNAL FIXATION REMOVAL Right 10/29/2018   Procedure: REMOVAL RIGHT ANKLE BIOMET ZIMMER EXTERNAL FIXATOR, SHORT LEG CAST APPLICATION;  Surgeon: Eldred Manges, MD;  Location: [facility] OR;  Service:  Orthopedics;  Laterality: Right;   INSERTION OF DIALYSIS CATHETER  05/26/2023   Procedure: INSERTION OF Left internal jugular DIALYSIS CATHETER;  Surgeon: Victorino Sparrow, MD;  Location: [facility] OR;  Service: Vascular;;   IR FLUORO GUIDE CV LINE LEFT  08/28/2023   IR US GUIDE VASC ACCESS LEFT  08/28/2023   MASTECTOMY     left sided   ORIF ANKLE FRACTURE Right 10/06/2018   Procedure: OPEN REDUCTION INTERNAL FIXATION (ORIF) RIGHT ANKLE TRIMALLEOLAR;  Surgeon: Eldred Manges, MD;  Location: [facility] OR;  Service: Orthopedics;  Laterality: Right;   PERIPHERAL VASCULAR BALLOON ANGIOPLASTY  08/13/2023   Procedure: PERIPHERAL VASCULAR BALLOON ANGIOPLASTY;  Surgeon: Nada Libman, MD;  [facility]: [facility];  Service: Cardiovascular;;   REVISON OF ARTERIOVENOUS FISTULA Left 05/26/2023   Procedure: REVISON OF ARTERIOVENOUS FISTULA LEFT ARM HEMATOMA WASH OUT;  Surgeon: Victorino Sparrow, MD;  Location: [facility] OR;  Service: Vascular;  Laterality: Left;   RIGHT/LEFT HEART CATH AND CORONARY ANGIOGRAPHY N/A 11/27/2022   Procedure: RIGHT/LEFT HEART CATH AND CORONARY ANGIOGRAPHY;  Surgeon: Swaziland, Peter M, MD;  Location: [facility] INVASIVE CV LAB;  Service: Cardiovascular;  Laterality: N/A;   FH  Family History  Problem Relation Age of Onset   Diabetes Mellitus II Mother    Hypertension Mother    Heart block Mother    Hypertension Sister    Hypertension Sister    Colon cancer Neg Hx    SH  reports that she has never smoked. She has never used smokeless tobacco. She reports that she does not drink alcohol and does not use drugs. Allergies  Allergies  Allergen Reactions   Norvasc [Amlodipine Besylate] Rash and Other (See Comments)    Dizziness    Reglan [Metoclopramide] Other (See Comments)    Hallucinations    Home medications Prior to Admission medications   Medication Sig Start Date End Date Taking? Authorizing Provider  acetaminophen (TYLENOL) 325 MG tablet Take 650 mg by mouth every 6 (six) hours as needed for  mild pain or fever.   Yes [provider]  albuterol (VENTOLIN HFA) 108 (90 Base) MCG/ACT inhaler Inhale 2 puffs into the lungs every 6 (six) hours as needed for wheezing or shortness of breath. 01/15/23  Yes Parrett, Virgel Bouquet, NP  ALPRAZolam (XANAX) 0.5 MG tablet Take 0.5 mg by mouth at bedtime as needed for anxiety or sleep. 07/19/23  Yes [provider]  aspirin EC 81 MG tablet Take 1 tablet (81 mg total) by mouth daily. Swallow whole. 12/07/21  Yes Pricilla Riffle, MD  cetirizine (ZYRTEC) 10 MG tablet Take 1 tablet (10 mg total) by mouth daily. 06/21/23  Yes Coralyn Helling, MD  Cholecalciferol (VITAMIN D-3 PO) Take 1 tablet by mouth daily.   Yes [provider]  cinacalcet (SENSIPAR) 30 MG tablet Take 30 mg by mouth daily.  Yes [provider]  docusate sodium (COLACE) 100 MG capsule Take 100 mg by mouth daily.   Yes [provider]  ferric citrate (AURYXIA) 1 GM 210 MG(Fe) tablet Take 420 mg by mouth 2 (two) times daily with a meal. With Breakfast & with supper   Yes [provider]  glipiZIDE (GLUCOTROL) 5 MG tablet Take 1 tablet (5 mg total) by mouth daily before breakfast. 05/21/23  Yes Nida, Denman George, MD  insulin degludec (TRESIBA FLEXTOUCH) 200 UNIT/ML FlexTouch Pen Inject 20 Units into the skin at bedtime. 08/06/23  Yes Roma Kayser, MD  ipratropium (ATROVENT) 0.06 % nasal spray Place 2 sprays into both nostrils daily as needed for rhinitis.   Yes [provider]  irbesartan (AVAPRO) 75 MG tablet Take 75 mg by mouth See admin instructions. Take 75 mg at night on non-dialysis days (Sunday, Tuesday, Thursday, Saturday) 01/18/23  Yes [provider]  labetalol (NORMODYNE) 100 MG tablet Take 100 mg by mouth 2 (two) times daily.   Yes [provider]  linaclotide (LINZESS) 290 MCG CAPS capsule Take 290 mcg by mouth daily before breakfast.   Yes [provider]  montelukast (SINGULAIR) 10 MG tablet Take 1  tablet (10 mg total) by mouth at bedtime. 04/22/23  Yes Coralyn Helling, MD  multivitamin (RENA-VIT) TABS tablet Take 1 tablet by mouth daily.   Yes [provider]  NIFEdipine (ADALAT CC) 30 MG 24 hr tablet Take 30 mg by mouth See admin instructions. Take 30 mg at night on non-dialysis days (Sunday, Tuesday, Thursday, Saturday) 11/26/21  Yes [provider]  ondansetron (ZOFRAN-ODT) 4 MG disintegrating tablet Take 1 tablet (4 mg total) by mouth every 8 (eight) hours as needed for nausea or vomiting. 02/19/23  Yes Gelene Mink, NP  OXYGEN Inhale 2 L into the lungs continuous.   Yes [provider]  pantoprazole (PROTONIX) 40 MG tablet TAKE 1 TABLET (40 MG TOTAL) BY MOUTH DAILY 30 MINUTES BEFORE BREAKFAST 01/10/23  Yes Rourk, Gerrit Friends, MD  Polyvinyl Alcohol-Povidone (REFRESH OP) Place 1 drop into both eyes daily as needed (dry eyes).   Yes [provider]  rOPINIRole (REQUIP XL) 2 MG 24 hr tablet Take 2 mg by mouth at bedtime.  09/10/16  Yes [provider]  rosuvastatin (CRESTOR) 20 MG tablet Take 20 mg by mouth daily.   Yes [provider]  sevelamer carbonate (RENVELA) 800 MG tablet Take 1,600-2,400 mg by mouth See admin instructions. Take 1600 mg at breakfast, 2400 mg at lunch and 1600 mg at dinner   Yes [provider]    Current Medications Scheduled Meds:  aspirin EC  81 mg Oral Daily   Chlorhexidine Gluconate Cloth  6 each Topical Daily   cinacalcet  30 mg Oral Q breakfast   heparin injection (subcutaneous)  5,000 Units Subcutaneous Q8H   insulin aspart  0-6 Units Subcutaneous TID WC   loratadine  10 mg Oral Daily   montelukast  10 mg Oral QHS   pantoprazole  40 mg Oral Daily   rOPINIRole  2 mg Oral QHS   rosuvastatin  20 mg Oral Daily   sevelamer carbonate  1,600 mg Oral BID WC   sevelamer carbonate  2,400 mg Oral Q lunch   Continuous Infusions:   ceFAZolin (ANCEF) IV 1 g (09/15/23 1741)   PRN Meds:.acetaminophen **OR**  acetaminophen, albuterol, ALPRAZolam, ondansetron **OR** ondansetron (ZOFRAN) IV, sodium chloride  CBC Recent Labs  Lab 09/14/23 1716 09/15/23 [id number]  WBC 22.0* 22.1*  NEUTROABS 19.7*  --   HGB 8.2* 9.9*  HCT 26.6* 32.2*  MCV 92.7 92.5  PLT 187 162   Basic Metabolic Panel Recent Labs  Lab 09/14/23 1716 09/15/23 0611  NA 129* 129*  K 3.6 4.2  CL 90* 89*  CO2 20* 20*  GLUCOSE 257* 215*  BUN 44* 57*  CREATININE 10.30* 11.65*  CALCIUM 9.0 9.1    Physical Exam  Blood pressure (!) 126/56, pulse 77, temperature 98 F (36.7 C), temperature source Rectal, resp. rate 17, height 5\' 3"  (1.6 m), weight 114.6 kg, SpO2 93%. 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  Bruit and thrill present.   Assessment 52F ESRD on HD MWF [facility] here with MSSA bacteremia and strong suspicion of TDC exit site and tunnel infection.  MSSA Bacteremia / [facility] Exit Site and Tunnel Infection: Received vancomycin and now on cefazolin per pharmacy.  Needs HD catheter removed after dialysis today.  See below.  ID following. ESRD on HD: Due for dialysis today.  Attempt to use AV fistula but given recent difficulties with cannulation keep TDC in place for backup currently.  Remove TDC after dialysis and if cannulation goes well hopefully will not need to replace with new catheter. Sepsis: Blood pressures stable.  Lactic acid improved.  Defervesced, leukocytosis persists Anemia: Hemoglobin 9.9, monitor at the current time CKD BMD calcium stable.  Continue binder and Cinacalcet   Plan As above Dialysis today Will discuss HD catheter removal with  surgery  [name]  09/16/2023, 9:45 AM

## 2023-09-16 NOTE — Procedures (Addendum)
   HEMODIALYSIS TREATMENT NOTE:    Media Information   Document Information  Photos   VENOUS AVF  09/16/2023 12:30  Attached To:  Hospital Encounter on 09/14/23  Source Information  Welden Hausmann, Lynford Humphrey, RN  [facility]  Document History     Venous segment of AVF has only a 2 cm area viable for cannulation.  When cannulating at the "lowest" point (0 cm mark) I was unable to fully advance the needle without losing flashback blood return.  The needle line aspirated and flushed with minimal resistance when the needle was retracted (as shown above), but when treatment was initiated venous pressures exceeded 300 mmHg at a low Qb of 200.  Will need a new catheter after line holiday.  HD is underway via [facility].  ___________________________________________________  Tx complete.  Net UF 2.2 liters.    Arman Filter, RN [facility] ICU[facility]

## 2023-09-16 NOTE — Consult Note (Signed)
[facility] Consult  Reason for Consult: Bacteremia,  tunneled dialysis catheter  Referring Physician: Dr. Gwenlyn Perking   Chief Complaint   Shortness of Breath     HPI: Caprica Arntz is a 52 y.o. female with bacteremia with Staph Aureus who comes into the Ed with fevers ,SOB, neck pain and concern for an infected left internal jugular tunneled dialysis catheter She had this placed by IR 9/11 after another dialysis catheter "fell out." She has a left AVF but this has had issues with prior hematoma, and has still not been used since her issues started in 05/2023 with a hematoma requiring declot and revision by Dr. Karin Lieu with Vascular and had a TDC placed then. She had a reported right internal jugular occlusion at that time.   Past Medical History:  Diagnosis Date   Anemia    Ankle fracture    Arthritis    Asthma    Blood transfusion without reported diagnosis    Breast cancer (HCC)    Cancer (HCC)    COVID    Diabetes mellitus without complication (HCC)    Dyspnea    End stage renal disease on dialysis (HCC)    M/W/F [facility] in [place]   GERD (gastroesophageal reflux disease)    Hypertension    OSA (obstructive sleep apnea)    uses CPAP sometimes   Pneumonia    PONV (postoperative nausea and vomiting)    Wears glasses     Past Surgical History:  Procedure Laterality Date   A/V FISTULAGRAM Left 08/13/2023   Procedure: A/V Fistulagram;  Surgeon: Nada Libman, MD;  [facility]: [facility] INVASIVE CV LAB;  Service: Cardiovascular;  Laterality: Left;   ABDOMINAL HYSTERECTOMY     APPLICATION OF WOUND VAC Left 05/26/2023   Procedure: APPLICATION OF WOUND VAC;  Surgeon: Victorino Sparrow, MD;  Location: [facility] OR;  Service: Vascular;  Laterality: Left;   AV FISTULA PLACEMENT  11/2014   at [facility]   AV FISTULA REPAIR N/A 04/2021   BALLOON DILATION N/A 07/10/2016   Procedure: BALLOON DILATION;  Surgeon: West Bali, MD;  Location: [facility] ENDO SUITE;  Service: Endoscopy;   Laterality: N/A;  Pyloric dilation   BREAST LUMPECTOMY     CATARACT EXTRACTION W/PHACO Right 05/24/2023   Procedure: CATARACT EXTRACTION PHACO AND INTRAOCULAR LENS PLACEMENT (IOC);  Surgeon: [name], MD;  Location: [facility] ORS;  Service: Ophthalmology;  Laterality: Right;  CDE: 5.00   CESAREAN SECTION     CHOLECYSTECTOMY     COLONOSCOPY  02/2023   COLONOSCOPY WITH PROPOFOL N/A 09/27/2016   Dr. Jena Gauss: Internal hemorrhoids repeat colonoscopy in 10 years   COLONOSCOPY WITH PROPOFOL N/A 02/28/2023   Procedure: COLONOSCOPY WITH PROPOFOL;  Surgeon: Corbin Ade, MD;  Location: [facility] END[facility] SU[facility];  Service: Endoscopy;  Laterality: N/A;  11:45 am, asa 3 dialysis pt   DILATION AND CURETTAGE OF UTERUS     ESOPHAGOGASTRODUODENOSCOPY N/A 07/10/2016   Dr.Fields- normal esophagus, gastric stenosis was found at the pylorus, gastritis on bx, normal examined duodenun   ESOPHAGOGASTRODUODENOSCOPY (EGD) WITH PROPOFOL N/A 11/21/2022   Procedure: ESOPHAGOGASTRODUODENOSCOPY (EGD) WITH PROPOFOL;  Surgeon: Dolores Frame, MD;  Location: [facility];  Service: Gastroenterology;  Laterality: N/A;   EXTERNAL FIXATION REMOVAL Right 10/29/2018   Procedure: REMOVAL RIGHT ANKLE BIOMET ZIMMER EXTERNAL FIXATOR, SHORT LEG CAST APPLICATION;  Surgeon: Eldred Manges, MD;  Location: [facility] OR;  Service: Orthopedics;  Laterality: Right;   INSERTION OF DIALYSIS CATHETER  05/26/2023   Procedure:  INSERTION OF Left internal jugular DIALYSIS CATHETER;  Surgeon: Victorino Sparrow, MD;  Location: [facility] OR;  Service: Vascular;;   IR FLUORO GUIDE CV LINE LEFT  08/28/2023   IR US GUIDE VASC ACCESS LEFT  08/28/2023   MASTECTOMY     left sided   ORIF ANKLE FRACTURE Right 10/06/2018   Procedure: OPEN REDUCTION INTERNAL FIXATION (ORIF) RIGHT ANKLE TRIMALLEOLAR;  Surgeon: Eldred Manges, MD;  Location: [facility] OR;  Service: Orthopedics;  Laterality: Right;   PERIPHERAL VASCULAR BALLOON ANGIOPLASTY  08/13/2023   Procedure: PERIPHERAL VASCULAR BALLOON  ANGIOPLASTY;  Surgeon: Nada Libman, MD;  [facility];  Service: Cardiovascular;;   REVISON OF ARTERIOVENOUS FISTULA Left 05/26/2023   Procedure: REVISON OF ARTERIOVENOUS FISTULA LEFT ARM HEMATOMA WASH OUT;  Surgeon: Victorino Sparrow, MD;  Location: [facility] OR;  Service: Vascular;  Laterality: Left;   RIGHT/LEFT HEART CATH AND CORONARY ANGIOGRAPHY N/A 11/27/2022   Procedure: RIGHT/LEFT HEART CATH AND CORONARY ANGIOGRAPHY;  Surgeon: Swaziland, Peter M, MD;  Location: [facility] INVASIVE CV LAB;  Service: Cardiovascular;  Laterality: N/A;    Family History  Problem Relation Age of Onset   Diabetes Mellitus II Mother    Hypertension Mother    Heart block Mother    Hypertension Sister    Hypertension Sister    Colon cancer Neg Hx     Social History   Tobacco Use   Smoking status: Never   Smokeless tobacco: Never  Vaping Use   Vaping status: Never Used  Substance Use Topics   Alcohol use: No    Alcohol/week: 0.0 standard drinks of alcohol   Drug use: No    Medications: I have reviewed the patient's current medications. Prior to Admission:  Facility-Administered Medications Prior to Admission  Medication Dose Route Frequency Provider Last Rate Last Admin   0.9 %  sodium chloride infusion  250 mL Intravenous PRN Nada Libman, MD       sodium chloride flush (NS) 0.9 % injection 3 mL  3 mL Intravenous Q12H Nada Libman, MD       Medications Prior to Admission  Medication Sig Dispense Refill Last Dose   acetaminophen (TYLENOL) 325 MG tablet Take 650 mg by mouth every 6 (six) hours as needed for mild pain or fever.   09/14/2023   albuterol (VENTOLIN HFA) 108 (90 Base) MCG/ACT inhaler Inhale 2 puffs into the lungs every 6 (six) hours as needed for wheezing or shortness of breath. 1 each 3 Past Week   ALPRAZolam (XANAX) 0.5 MG tablet Take 0.5 mg by mouth at bedtime as needed for anxiety or sleep.   Past Week   aspirin EC 81 MG tablet Take 1 tablet (81 mg total) by mouth daily.  Swallow whole. 90 tablet 3 Past Week   cetirizine (ZYRTEC) 10 MG tablet Take 1 tablet (10 mg total) by mouth daily. 30 tablet 3 Past Week   Cholecalciferol (VITAMIN D-3 PO) Take 1 tablet by mouth daily.   Past Week   cinacalcet (SENSIPAR) 30 MG tablet Take 30 mg by mouth daily.   Past Week   docusate sodium (COLACE) 100 MG capsule Take 100 mg by mouth daily.   Past Week   ferric citrate (AURYXIA) 1 GM 210 MG(Fe) tablet Take 420 mg by mouth 2 (two) times daily with a meal. With Breakfast & with supper   Past Week   glipiZIDE (GLUCOTROL) 5 MG tablet Take 1 tablet (5 mg total) by mouth daily before breakfast.  180 tablet 0 Past Week   insulin degludec (TRESIBA FLEXTOUCH) 200 UNIT/ML FlexTouch Pen Inject 20 Units into the skin at bedtime. 9 mL 0 Past Week   ipratropium (ATROVENT) 0.06 % nasal spray Place 2 sprays into both nostrils daily as needed for rhinitis.   Past Week   irbesartan (AVAPRO) 75 MG tablet Take 75 mg by mouth See admin instructions. Take 75 mg at night on non-dialysis days (Sunday, Tuesday, Thursday, Saturday)   Past Week   labetalol (NORMODYNE) 100 MG tablet Take 100 mg by mouth 2 (two) times daily.   Past Week   linaclotide (LINZESS) 290 MCG CAPS capsule Take 290 mcg by mouth daily before breakfast.   Past Week   montelukast (SINGULAIR) 10 MG tablet Take 1 tablet (10 mg total) by mouth at bedtime. 30 tablet 0 Past Week   multivitamin (RENA-VIT) TABS tablet Take 1 tablet by mouth daily.   Past Week   NIFEdipine (ADALAT CC) 30 MG 24 hr tablet Take 30 mg by mouth See admin instructions. Take 30 mg at night on non-dialysis days (Sunday, Tuesday, Thursday, Saturday)   Past Week   ondansetron (ZOFRAN-ODT) 4 MG disintegrating tablet Take 1 tablet (4 mg total) by mouth every 8 (eight) hours as needed for nausea or vomiting. 60 tablet 3 Past Month   OXYGEN Inhale 2 L into the lungs continuous.   09/14/2023   pantoprazole (PROTONIX) 40 MG tablet TAKE 1 TABLET (40 MG TOTAL) BY MOUTH DAILY 30  MINUTES BEFORE BREAKFAST 90 tablet 3 Past Week   Polyvinyl Alcohol-Povidone (REFRESH OP) Place 1 drop into both eyes daily as needed (dry eyes).   Past Month   rOPINIRole (REQUIP XL) 2 MG 24 hr tablet Take 2 mg by mouth at bedtime.    Past Week   rosuvastatin (CRESTOR) 20 MG tablet Take 20 mg by mouth daily.   Past Week   sevelamer carbonate (RENVELA) 800 MG tablet Take 1,600-2,400 mg by mouth See admin instructions. Take 1600 mg at breakfast, 2400 mg at lunch and 1600 mg at dinner   Past Week   Scheduled:  aspirin EC  81 mg Oral Daily   Chlorhexidine Gluconate Cloth  6 each Topical Daily   cinacalcet  30 mg Oral Q breakfast   heparin injection (subcutaneous)  5,000 Units Subcutaneous Q8H   insulin aspart  0-6 Units Subcutaneous TID WC   loratadine  10 mg Oral Daily   montelukast  10 mg Oral QHS   pantoprazole  40 mg Oral Daily   rOPINIRole  2 mg Oral QHS   rosuvastatin  20 mg Oral Daily   sevelamer carbonate  1,600 mg Oral BID WC   sevelamer carbonate  2,400 mg Oral Q lunch   Continuous:  anticoagulant sodium citrate      ceFAZolin (ANCEF) IV 1 g (09/15/23 1741)   UYQ:[id number] **OR** acetaminophen, albuterol, ALPRAZolam, alteplase, anticoagulant sodium citrate, heparin, HYDROmorphone (DILAUDID) injection, lidocaine (PF), lidocaine-prilocaine, ondansetron **OR** ondansetron (ZOFRAN) IV, pentafluoroprop-tetrafluoroeth, sodium chloride  Allergies  Allergen Reactions   Norvasc [Amlodipine Besylate] Rash and Other (See Comments)    Dizziness    Reglan [Metoclopramide] Other (See Comments)    Hallucinations      ROS:  A comprehensive review of systems was negative except for: Constitutional: positive for fevers Respiratory: positive for SOB Musculoskeletal: positive for pain along catheter track  Blood pressure (!) 136/51, pulse 90, temperature 98.1 F (36.7 C), temperature source Oral, resp. rate (!) 22, height 5\' 3"  (1.6 m), weight  114.6 kg, SpO2 93%. Physical Exam Vitals  reviewed.  HENT:     Head: Atraumatic.  Eyes:     Extraocular Movements: Extraocular movements intact.  Cardiovascular:     Rate and Rhythm: Regular rhythm.  Pulmonary:     Effort: Pulmonary effort is normal.  Chest:     Comments: Left chest tunneled dialysis catheter  Abdominal:     Palpations: Abdomen is soft.  Skin:    General: Skin is warm.  Neurological:     General: No focal deficit present.     Results: Results for orders placed or performed during the hospital encounter of 09/14/23 (from the past 48 hour(s))  SARS Coronavirus 2 by RT PCR (hospital order, performed in [facility] hospital lab) *cepheid single result test* Anterior Nasal Swab     Status: None   Collection Time: 09/14/23  4:10 PM   Specimen: Anterior Nasal Swab  Result Value Ref Range   SARS Coronavirus 2 by RT PCR NEGATIVE NEGATIVE    Comment: (NOTE) SARS-CoV-2 target nucleic acids are NOT DETECTED.  The SARS-CoV-2 RNA is generally detectable in upper and lower respiratory specimens during the acute phase of infection. The lowest concentration of SARS-CoV-2 viral copies this assay can detect is 250 copies / mL. A negative result does not preclude SARS-CoV-2 infection and should not be used as the sole basis for treatment or other patient management decisions.  A negative result may occur with improper specimen collection / handling, submission of specimen other than nasopharyngeal swab, presence of viral mutation(s) within the areas targeted by this assay, and inadequate number of viral copies (<250 copies / mL). A negative result must be combined with clinical observations, patient history, and epidemiological information.  Fact Sheet for Patients:   RoadLapTop.co.za  Fact Sheet for Healthcare Providers: http://kim-miller.com/  This test is not yet approved or  cleared by the [place] FDA and has been authorized for detection and/or diagnosis of  SARS-CoV-2 by FDA under an Emergency Use Authorization (EUA).  This EUA will remain in effect (meaning this test can be used) for the duration of the COVID-19 declaration under Section 564(b)(1) of the Act, 21 U.S.C. section 360bbb-3(b)(1), unless the authorization is terminated or revoked sooner.  Performed at [facility], [address] [place], [place] [address]   Culture, blood (Routine x 2)     Status: Abnormal (Preliminary result)   Collection Time: 09/14/23  4:10 PM   Specimen: BLOOD  Result Value Ref Range   Specimen Description      BLOOD Performed at [facility], [address], [place], [place] [address]    Special Requests      BOTTLES DRAWN AEROBIC AND ANAEROBIC Blood Culture results may not be optimal due to an inadequate volume of blood received in culture bottles Performed at [facility], [address] [place], [place] [address]    Culture  Setup Time      GRAM POSITIVE COCCI ANAEROBIC BOTTLE ONLY Gram Stain Report Called to,Read Back By and Verified With: L HYLTON AT 0857 ON [id number] BY S DALTON GRAM STAIN REVIEWED-AGREE WITH RESULT CRITICAL RESULT CALLED TO, READ BACK BY AND VERIFIED WITH: RN Quin Hoop [id number] AT 1354    Culture (A)     STAPHYLOCOCCUS AUREUS SUSCEPTIBILITIES TO FOLLOW Performed at [facility], [address], [place], [place] [address]    Report Status PENDING   Blood Culture ID Panel (Reflexed)     Status: Abnormal   Collection Time:  09/14/23  4:10 PM  Result Value Ref Range   Enterococcus faecalis NOT DETECTED NOT DETECTED   Enterococcus Faecium NOT DETECTED NOT DETECTED   Listeria monocytogenes NOT DETECTED NOT DETECTED   Staphylococcus species DETECTED (A) NOT DETECTED    Comment: CRITICAL RESULT CALLED TO, READ BACK BY AND VERIFIED WITH: RN Quin Hoop [id number] AT 1354 BY EC    Staphylococcus aureus (BCID) DETECTED (A) NOT DETECTED    Comment: CRITICAL RESULT CALLED TO, READ BACK BY AND VERIFIED WITH: RN Quin Hoop  [id number] AT 1354 BY EC    Staphylococcus epidermidis NOT DETECTED NOT DETECTED   Staphylococcus lugdunensis NOT DETECTED NOT DETECTED   Streptococcus species NOT DETECTED NOT DETECTED   Streptococcus agalactiae NOT DETECTED NOT DETECTED   Streptococcus pneumoniae NOT DETECTED NOT DETECTED   Streptococcus pyogenes NOT DETECTED NOT DETECTED   A.calcoaceticus-baumannii NOT DETECTED NOT DETECTED   Bacteroides fragilis NOT DETECTED NOT DETECTED   Enterobacterales NOT DETECTED NOT DETECTED   Enterobacter cloacae complex NOT DETECTED NOT DETECTED   Escherichia coli NOT DETECTED NOT DETECTED   Klebsiella aerogenes NOT DETECTED NOT DETECTED   Klebsiella oxytoca NOT DETECTED NOT DETECTED   Klebsiella pneumoniae NOT DETECTED NOT DETECTED   Proteus species NOT DETECTED NOT DETECTED   Salmonella species NOT DETECTED NOT DETECTED   Serratia marcescens NOT DETECTED NOT DETECTED   Haemophilus influenzae NOT DETECTED NOT DETECTED   Neisseria meningitidis NOT DETECTED NOT DETECTED   Pseudomonas aeruginosa NOT DETECTED NOT DETECTED   Stenotrophomonas maltophilia NOT DETECTED NOT DETECTED   Candida albicans NOT DETECTED NOT DETECTED   Candida auris NOT DETECTED NOT DETECTED   Candida glabrata NOT DETECTED NOT DETECTED   Candida krusei NOT DETECTED NOT DETECTED   Candida parapsilosis NOT DETECTED NOT DETECTED   Candida tropicalis NOT DETECTED NOT DETECTED   Cryptococcus neoformans/gattii NOT DETECTED NOT DETECTED   Meth resistant mecA/C and MREJ NOT DETECTED NOT DETECTED    Comment: Performed at [facility], [address] [place], [place] [address]  Comprehensive metabolic panel     Status: Abnormal   Collection Time: 09/14/23  5:16 PM  Result Value Ref Range   Sodium 129 (L) 135 - 145 mmol/L   Potassium 3.6 3.5 - 5.1 mmol/L   Chloride 90 (L) 98 - 111 mmol/L   CO2 20 (L) 22 - 32 mmol/L   Glucose, Bld 257 (H) 70 - 99 mg/dL    Comment: Glucose reference range applies only to samples taken  after fasting for at least 8 hours.   BUN 44 (H) 6 - 20 mg/dL   Creatinine, Ser 57.84 (H) 0.44 - 1.00 mg/dL   Calcium 9.0 8.9 - 69.6 mg/dL   Total Protein 7.7 6.5 - 8.1 g/dL   Albumin 3.8 3.5 - 5.0 g/dL   AST 17 15 - 41 U/L   ALT 13 0 - 44 U/L   Alkaline Phosphatase 88 38 - 126 U/L   Total Bilirubin 0.4 0.3 - 1.2 mg/dL   GFR, Estimated 4 (L) >60 mL/min    Comment: (NOTE) Calculated using the CKD-EPI Creatinine Equation (2021)    Anion gap 19 (H) 5 - 15    Comment: Performed at [facility], [address], [place], [place] [address]  Lactic acid, plasma     Status: Abnormal   Collection Time: 09/14/23  5:16 PM  Result Value Ref Range   Lactic Acid, Venous 2.9 (HH) 0.5 - 1.9 mmol/L    Comment: CRITICAL RESULT CALLED  TO, READ BACK BY AND VERIFIED WITH BOBBY GREENWOOD 09/14/23 1750 NN Performed at [facility], [address], [place], [place] [address]   CBC with Differential     Status: Abnormal   Collection Time: 09/14/23  5:16 PM  Result Value Ref Range   WBC 22.0 (H) 4.0 - 10.5 K/uL   RBC 2.87 (L) 3.87 - 5.11 MIL/uL   Hemoglobin 8.2 (L) 12.0 - 15.0 g/dL   HCT 51.8 (L) 84.1 - 66.0 %   MCV 92.7 80.0 - 100.0 fL   MCH 28.6 26.0 - 34.0 pg   MCHC 30.8 30.0 - 36.0 g/dL   RDW 63.0 16.0 - 10.9 %   Platelets 187 150 - 400 K/uL   nRBC 0.0 0.0 - 0.2 %   Neutrophils Relative % 88 %   Neutro Abs 19.7 (H) 1.7 - 7.7 K/uL   Lymphocytes Relative 1 %   Lymphs Abs 0.3 (L) 0.7 - 4.0 K/uL   Monocytes Relative 4 %   Monocytes Absolute 1.0 0.1 - 1.0 K/uL   Eosinophils Relative 6 %   Eosinophils Absolute 1.2 (H) 0.0 - 0.5 K/uL   Basophils Relative 0 %   Basophils Absolute 0.1 0.0 - 0.1 K/uL   Immature Granulocytes 1 %   Abs Immature Granulocytes 0.17 (H) 0.00 - 0.07 K/uL    Comment: Performed at [facility], [address], [place], [place] [address]  Protime-INR     Status: Abnormal   Collection Time: 09/14/23  5:16 PM  Result Value Ref Range   Prothrombin Time 18.5 (H) 11.4 - 15.2 seconds    INR 1.5 (H) 0.8 - 1.2    Comment: (NOTE) INR goal varies based on device and disease states. Performed at [facility], [address], [place], [place] [address]   Culture, blood (Routine x 2)     Status: Abnormal (Preliminary result)   Collection Time: 09/14/23  5:16 PM   Specimen: BLOOD RIGHT HAND  Result Value Ref Range   Specimen Description      BLOOD RIGHT HAND Performed at [facility], [address] [place], [place] [address]    Special Requests      BOTTLES DRAWN AEROBIC AND ANAEROBIC Blood Culture results may not be optimal due to an excessive volume of blood received in culture bottles Performed at [facility], [address] [place], [place] [address]    Culture  Setup Time      GRAM POSITIVE COCCI ANAEROBIC BOTTLE ONLY Gram Stain Report Called to,Read Back By and Verified With: L HYLTON AT 0857 ON [id number] BY S DALTON    Culture STAPHYLOCOCCUS AUREUS (A)    Report Status PENDING   Blood gas, venous (at [facility])     Status: Abnormal   Collection Time: 09/14/23  5:16 PM  Result Value Ref Range   pH, Ven 7.29 7.25 - 7.43   pCO2, Ven 51 44 - 60 mmHg   pO2, Ven <31 (LL) 32 - 45 mmHg    Comment: CRITICAL RESULT CALLED TO, READ BACK BY AND VERIFIED WITH:  BOBBY GREENWOOD 09/14/23 1725 NN    Bicarbonate 24.3 20.0 - 28.0 mmol/L   Acid-base deficit 1.9 0.0 - 2.0 mmol/L   O2 Saturation 39.4 %   Patient temperature 39.5    Collection site BLOOD RIGHT HAND    Drawn by [id number]     Comment: Performed at [facility], [address] [place], [place] [address]  Troponin I (High Sensitivity)  Status: Abnormal   Collection Time: 09/14/23  5:16 PM  Result Value Ref Range   Troponin I (High Sensitivity) 58 (H) <18 ng/L    Comment: (NOTE) Elevated high sensitivity troponin I (hsTnI) values and significant  changes across serial measurements may suggest ACS but many other  chronic and acute conditions are known to elevate hsTnI results.  Refer to the "Links"  section for chest pain algorithms and additional  guidance. Performed at [facility], [address], [place], [place] [address]   Brain natriuretic peptide     Status: Abnormal   Collection Time: 09/14/23  5:16 PM  Result Value Ref Range   B Natriuretic Peptide 296.0 (H) 0.0 - 100.0 pg/mL    Comment: Performed at [facility], [address] [place], [place] [address]  Lactic acid, plasma     Status: None   Collection Time: 09/14/23  6:42 PM  Result Value Ref Range   Lactic Acid, Venous 1.4 0.5 - 1.9 mmol/L    Comment: Performed at [facility], [address], [place], [place] [address]  Troponin I (High Sensitivity)     Status: Abnormal   Collection Time: 09/14/23  6:42 PM  Result Value Ref Range   Troponin I (High Sensitivity) 63 (H) <18 ng/L    Comment: (NOTE) Elevated high sensitivity troponin I (hsTnI) values and significant  changes across serial measurements may suggest ACS but many other  chronic and acute conditions are known to elevate hsTnI results.  Refer to the "Links" section for chest pain algorithms and additional  guidance. Performed at [facility], [address], [place], [place] [address]   MRSA Next Gen by PCR, Nasal     Status: None   Collection Time: 09/14/23  8:13 PM   Specimen: Nasal Mucosa; Nasal Swab  Result Value Ref Range   MRSA by PCR Next Gen NOT DETECTED NOT DETECTED    Comment: (NOTE) The GeneXpert MRSA Assay (FDA approved for NASAL specimens only), is one component of a comprehensive MRSA colonization surveillance program. It is not intended to diagnose MRSA infection nor to guide or monitor treatment for MRSA infections. Test performance is not FDA approved in patients less than 97 years old. Performed at [facility], [address] [place], [place] [address]   Glucose, capillary     Status: Abnormal   Collection Time: 09/14/23  8:16 PM  Result Value Ref Range   Glucose-Capillary 201 (H) 70 - 99 mg/dL    Comment: Glucose reference range  applies only to samples taken after fasting for at least 8 hours.  CBC     Status: Abnormal   Collection Time: 09/15/23  6:11 AM  Result Value Ref Range   WBC 22.1 (H) 4.0 - 10.5 K/uL   RBC 3.48 (L) 3.87 - 5.11 MIL/uL   Hemoglobin 9.9 (L) 12.0 - 15.0 g/dL   HCT 13.0 (L) 86.5 - 78.4 %   MCV 92.5 80.0 - 100.0 fL   MCH 28.4 26.0 - 34.0 pg   MCHC 30.7 30.0 - 36.0 g/dL   RDW 69.6 29.5 - 28.4 %   Platelets 162 150 - 400 K/uL   nRBC 0.0 0.0 - 0.2 %    Comment: Performed at [facility], [address] [place], [place] [address]  Basic metabolic panel     Status: Abnormal   Collection Time: 09/15/23  6:11 AM  Result Value Ref Range   Sodium 129 (L) 135 - 145 mmol/L   Potassium 4.2 3.5 -  5.1 mmol/L   Chloride 89 (L) 98 - 111 mmol/L   CO2 20 (L) 22 - 32 mmol/L   Glucose, Bld 215 (H) 70 - 99 mg/dL    Comment: Glucose reference range applies only to samples taken after fasting for at least 8 hours.   BUN 57 (H) 6 - 20 mg/dL   Creatinine, Ser 04.54 (H) 0.44 - 1.00 mg/dL   Calcium 9.1 8.9 - 09.8 mg/dL   GFR, Estimated 4 (L) >60 mL/min    Comment: (NOTE) Calculated using the CKD-EPI Creatinine Equation (2021)    Anion gap 20 (H) 5 - 15    Comment: Performed at [facility], [address] [place], [place] [address]  Hemoglobin A1c     Status: Abnormal   Collection Time: 09/15/23  6:11 AM  Result Value Ref Range   Hgb A1c MFr Bld 8.5 (H) 4.8 - 5.6 %    Comment: (NOTE) Pre diabetes:          5.7%-6.4%  Diabetes:              >6.4%  Glycemic control for   <7.0% adults with diabetes    Mean Plasma Glucose 197.25 mg/dL    Comment: Performed at [facility], [address], [place], [place] [address]  Glucose, capillary     Status: Abnormal   Collection Time: 09/15/23  7:46 AM  Result Value Ref Range   Glucose-Capillary 210 (H) 70 - 99 mg/dL    Comment: Glucose reference range applies only to samples taken after fasting for at least 8 hours.  Glucose, capillary     Status:  Abnormal   Collection Time: 09/15/23 11:24 AM  Result Value Ref Range   Glucose-Capillary 241 (H) 70 - 99 mg/dL    Comment: Glucose reference range applies only to samples taken after fasting for at least 8 hours.  Glucose, capillary     Status: Abnormal   Collection Time: 09/15/23  4:00 PM  Result Value Ref Range   Glucose-Capillary 203 (H) 70 - 99 mg/dL    Comment: Glucose reference range applies only to samples taken after fasting for at least 8 hours.  Glucose, capillary     Status: Abnormal   Collection Time: 09/15/23  8:47 PM  Result Value Ref Range   Glucose-Capillary 209 (H) 70 - 99 mg/dL    Comment: Glucose reference range applies only to samples taken after fasting for at least 8 hours.  Glucose, capillary     Status: Abnormal   Collection Time: 09/16/23  7:34 AM  Result Value Ref Range   Glucose-Capillary 156 (H) 70 - 99 mg/dL    Comment: Glucose reference range applies only to samples taken after fasting for at least 8 hours.  Glucose, capillary     Status: Abnormal   Collection Time: 09/16/23 11:34 AM  Result Value Ref Range   Glucose-Capillary 204 (H) 70 - 99 mg/dL    Comment: Glucose reference range applies only to samples taken after fasting for at least 8 hours.  CBC     Status: Abnormal   Collection Time: 09/16/23  1:19 PM  Result Value Ref Range   WBC 13.1 (H) 4.0 - 10.5 K/uL   RBC 3.09 (L) 3.87 - 5.11 MIL/uL   Hemoglobin 8.8 (L) 12.0 - 15.0 g/dL   HCT 62.1 (L) 30.8 - 65.7 %   MCV 89.0 80.0 - 100.0 fL   MCH 28.5 26.0 - 34.0 pg   MCHC 32.0 30.0 -  36.0 g/dL   RDW 16.1 09.6 - 04.5 %   Platelets 176 150 - 400 K/uL   nRBC 0.0 0.0 - 0.2 %    Comment: Performed at [facility], [address], [place], [place] [address]    ECHOCARDIOGRAM COMPLETE  Result Date: 09/15/2023    ECHOCARDIOGRAM REPORT   Patient Name:   [facility] Date of Exam: 09/15/2023 Medical Rec #:  [id number]   Height:       63.0 in Accession #:    [id number]  Weight:       245.0 lb Date of Birth:   09/20/1971  BSA:          2.108 m Patient Age:    51 years    BP:           125132/4757 mmHg Patient Gender: F           HR:           93 bpm. [facility]am [facility] Procedure: 2D Echo, Cardiac Doppler and Color Doppler Indications:    Fever R50.9  History:        Patient has prior history of Echocardiogram examinations, most                 recent 11/19/2022. CHF; Risk Factors:Hypertension, Diabetes and                 Dyslipidemia. ESRD on dialysis [facility]), Breast cancer (HCC), OSA                 (obstructive sleep apnea), Covid-19.  Sonographer:    [name] RCS Referring Phys: [id number] MAURICIO DANIEL ARRIEN IMPRESSIONS  1. Left ventricular ejection fraction, by estimation, is 60 to 65%. The left ventricle has normal function. The left ventricle has no regional wall motion abnormalities. There is mild concentric left ventricular hypertrophy. Left ventricular diastolic parameters are consistent with Grade I diastolic dysfunction (impaired relaxation).  2. Right ventricular systolic function is normal. The right ventricular size is normal. Tricuspid regurgitation signal is inadequate for assessing PA pressure.  3. No evidence of mitral valve regurgitation.  4. The aortic valve is grossly normal. Aortic valve regurgitation is not visualized. Mild aortic valve stenosis.  5. The inferior vena cava is normal in size with greater than 50% respiratory variability, suggesting right atrial pressure of 3 mmHg. Comparison(s): No significant change from prior study. Conclusion(s)/Recommendation(s): No evidence of valvular vegetations on this transthoracic echocardiogram. Consider a transesophageal echocardiogram to exclude infective endocarditis if clinically indicated. FINDINGS  Left Ventricle: Left ventricular ejection fraction, by estimation, is 60 to 65%. The left ventricle has normal function. The left ventricle has no regional wall motion abnormalities. The left ventricular internal cavity size was normal in  size. There is  mild concentric left ventricular hypertrophy. Left ventricular diastolic parameters are consistent with Grade I diastolic dysfunction (impaired relaxation). Right Ventricle: The right ventricular size is normal. Right ventricular systolic function is normal. Tricuspid regurgitation signal is inadequate for assessing PA pressure. Left Atrium: Left atrial size was normal in size. Right Atrium: Right atrial size was normal in size. Pericardium: There is no evidence of pericardial effusion. Mitral Valve: Mild mitral annular calcification. No evidence of mitral valve regurgitation. Tricuspid Valve: Tricuspid valve regurgitation is not demonstrated. Aortic Valve: The aortic valve is grossly normal. Aortic valve regurgitation is not visualized. Mild aortic stenosis is present. Aortic valve mean gradient measures 10.3 mmHg. Aortic valve peak gradient measures 21.8 mmHg. Aortic valve area, by  VTI measures 1.93 cm. Pulmonic Valve: Pulmonic valve regurgitation is not visualized. Aorta: The aortic root is normal in size and structure. Venous: The inferior vena cava is normal in size with greater than 50% respiratory variability, suggesting right atrial pressure of 3 mmHg. IAS/Shunts: The interatrial septum was not well visualized.  LEFT VENTRICLE PLAX 2D LVIDd:         4.50 cm   Diastology LVIDs:         2.50 cm   LV e' medial:    3.59 cm/s LV PW:         1.30 cm   LV E/e' medial:  25.1 LV IVS:        1.20 cm   LV e' lateral:   8.32 cm/s LVOT diam:     1.90 cm   LV E/e' lateral: 10.8 LV SV:         76 LV SV Index:   36 LVOT Area:     2.84 cm  RIGHT VENTRICLE RV S prime:     15.60 cm/s TAPSE (M-mode): 2.6 cm LEFT ATRIUM             Index        RIGHT ATRIUM           Index LA diam:        4.20 cm 1.99 cm/m   RA Area:     22.20 cm LA Vol (A2C):   53.1 ml 25.19 ml/m  RA Volume:   72.50 ml  34.40 ml/m LA Vol (A4C):   45.9 ml 21.78 ml/m LA Biplane Vol: 50.5 ml 23.96 ml/m  AORTIC VALVE AV Area (Vmax):    1.84  cm AV Area (Vmean):   1.91 cm AV Area (VTI):     1.93 cm AV Vmax:           233.67 cm/s AV Vmean:          144.000 cm/s AV VTI:            0.394 m AV Peak Grad:      21.8 mmHg AV Mean Grad:      10.3 mmHg LVOT Vmax:         151.50 cm/s LVOT Vmean:        96.850 cm/s LVOT VTI:          0.268 m LVOT/AV VTI ratio: 0.68  AORTA Ao Root diam: 3.00 cm MITRAL VALVE MV Area (PHT): 2.42 cm    SHUNTS MV Decel Time: 313 msec    Systemic VTI:  0.27 m MV E velocity: 90.20 cm/s  Systemic Diam: 1.90 cm MV A velocity: 91.70 cm/s MV E/A ratio:  0.98 Carolan Clines Electronically signed by Carolan Clines Signature Date/Time: 09/15/2023/11:00:06 AM    Final    DG Chest Port 1 View  Result Date: 09/14/2023 CLINICAL DATA:  Shortness of breath. EXAM: PORTABLE CHEST 1 VIEW COMPARISON:  Chest radiograph dated 06/03/2023. FINDINGS: The heart is enlarged. A left internal jugular central venous catheter tip overlies the right atrium. There is mild pulmonary vascular congestion. There is minimal left basilar atelectasis/airspace disease. There is no pleural effusion or pneumothorax on either side. Degenerative changes are seen in the spine. IMPRESSION: Cardiomegaly with mild pulmonary vascular congestion. Minimal left basilar atelectasis/airspace disease. Electronically Signed   By: Romona Curls M.D.   On: 09/14/2023 17:14     Assessment & Plan:  Fransisca Burningham is a 52 y.o. female with bacteremia with Staph aureus in the setting  of an [facility] placed 9/11 by IR after it was replaced when her catheter "fell out" and was previously placed by Dr. Karin Lieu 05/2023 after she had hematoma of her fistula and required intervention.   Will need to remove the [facility]. She has an occluded right internal jugular per report and I do not do left sided catheters. Will need IR or Vascular to replace once deemed to have sufficient line holiday.   Discussed risk of bleeding, infection, need for additional catheter, cxr after removal.   All questions were answered  to the satisfaction of the patient.    Lucretia Roers 09/16/2023, 1:57 PM

## 2023-09-16 NOTE — Progress Notes (Signed)
[facility]  Chest x-ray with catheter removed no acute complication noted.  Official read pending .  Algis Greenhouse, MD

## 2023-09-16 NOTE — Progress Notes (Signed)
Progress Note   PatientShardi [name] ZOX:[id number] DOB: Jul 05, 1971 DOA: 09/14/2023     2 DOS: the patient was seen and examined on 09/16/2023   Brief hospital admission narrative: As per H&P written by Dr. [name] on 09/14/2023 Patricia [name] is a 52 y.o. female with medical history significant of ESRD on hemodialysis, (M,W,F), obesity class 3, T2DM, hypertension and asthma who presented with dyspnea, fevers and left neck pain.    Patient has a left upper extremity fistula that has been malfunctioning, she underwent Fistulogram on 08.27.24 by Dr [name] and then had a follow up with with vascular surgery in [facility] 09/03/23, plan for further follow up with vascular ultrasound.  In the meanwhile she has been getting HD through a tunneled catheter in her left internal jugular vein.    Patient was noted to have a low grade fever on Wednesday, 09/25 during dialysis. She was treated with acetaminophen and completed HD. During that session, initially her left upper extremity fistula was used but she developed pain and discomfort, not able to complete HD by this route, then completed her treatment through her HD catheter.    The following day she felt a little better, but still not back to baseline, she continue having chills and generalized malaise.  Yesterday she had several episodes of vomiting prior to HD. She was medicated with ondansetron, acetaminophen and lomotil. She was able to complete her HD treatment through her HD catheter.    At home she continue to have chills and now developed pain in her left shoulder area, radiating into her head and left ear. No improving factors.   Assessment and Plan: * Severe sepsis secondary to MSSA bacteremia (HCC) -Severe sepsis with elevated lactic acid, present on admission.  -Follow 2D echo -Appreciate ID service recommendation -Continue IV antibiotics at the moment Ancef -Continue supportive care and follow clinical response -Patient will need exchange of  dialysis catheter (after discussing with ID that something that can be done after dialysis today). -Stable vital signs. -Will repeat blood cultures  Acute on chronic hypoxemic and hypercapnic respiratory failure. (Sepsis related).  -Continue supplemental 02 per [place].  -Continue CPAP nightly.  ESRD on dialysis [facility]) Clinically patient with no signs of volume overload and is stable electrolytes at the moment -Patient chronically Monday, Wednesday and Friday for dialysis purposes -Last treatment on 09/13/2023 -Nephrology service consulted for dialysis continuation. -Planning for hemodialysis today with subsequent line holiday and removal Alysis catheter.  Essential hypertension -Currently stable -Follow blood pressure and adjust antihypertensive regimen as needed.  GERD (gastroesophageal reflux disease) -Continue with PPI  -Lifestyle changes discussed with patient.  Type 2 diabetes mellitus with hyperlipidemia (HCC) -Continue sliding scale insulin and follow CBGs fluctuation with further adjustment to hypoglycemic regimen as required.  Class 3 obesity (HCC) -Body mass index is 43.78 kg/m. -Low-calorie diet, portion control and increase physical activity discussed with patient.  OSA -Continue CPAP nightly. -Patient instructed to be compliant.   Subjective:  Good saturation on chronic supplementation; currently afebrile.  No nausea, no vomiting, no chest pain.  Complaining of left side of her neck tenderness.  Physical Exam: Vitals:   09/16/23 1530 09/16/23 1545 09/16/23 1600 09/16/23 1615  BP: (!) 130/34 120/74 (!) 116/58 128/89  Pulse: 87 92 91 85  Resp:  18    Temp:    98.2 F (36.8 C)  TempSrc:    Oral  SpO2:    99%  Weight:    112.1 kg  Height:  General exam: Alert, awake, oriented x 3; no chest pain, no nausea, no vomiting.  Complaining of left-sided neck pain. Respiratory system: Good saturation on chronic supplementation.  No using accessory  muscle. Cardiovascular system:RRR. No rubs or gallops; unable to properly assess JVD with body habitus. Gastrointestinal system: Abdomen is obese, nondistended, soft and nontender. No organomegaly or masses felt. Normal bowel sounds heard. Central nervous system: Alert and oriented. No focal neurological deficits. Extremities: No clubbing; trace edema appreciated bilaterally. Skin: No petechiae. Psychiatry: Judgement and insight appear normal. Mood & affect appropriate.   Data Reviewed: CBC: WBCs 13.1, hemoglobin 8.8 and platelet count 176K Renal function panel: Sodium 131, potassium 4.2, chloride 90, bicarb 17, glucose 188, BUN 88, creatinine 14.29 and GFR 3  Family Communication: No family at bedside.  Disposition: Status is: Inpatient Remains inpatient appropriate because: Continue IV antibiotics.   Planned Discharge Destination: Home  Time spent: 50 minutes  Author: Vassie Loll, MD 09/16/2023 6:46 PM  For on call review [email or web address].

## 2023-09-17 ENCOUNTER — Encounter (INDEPENDENT_AMBULATORY_CARE_PROVIDER_SITE_OTHER): Payer: Medicare Other | Admitting: Ophthalmology

## 2023-09-17 DIAGNOSIS — Z992 Dependence on renal dialysis: Secondary | ICD-10-CM | POA: Diagnosis not present

## 2023-09-17 DIAGNOSIS — E113513 Type 2 diabetes mellitus with proliferative diabetic retinopathy with macular edema, bilateral: Secondary | ICD-10-CM

## 2023-09-17 DIAGNOSIS — Z23 Encounter for immunization: Secondary | ICD-10-CM | POA: Diagnosis not present

## 2023-09-17 DIAGNOSIS — A419 Sepsis, unspecified organism: Secondary | ICD-10-CM | POA: Diagnosis not present

## 2023-09-17 DIAGNOSIS — N250 Renal osteodystrophy: Secondary | ICD-10-CM | POA: Diagnosis not present

## 2023-09-17 DIAGNOSIS — E66813 Obesity, class 3: Secondary | ICD-10-CM | POA: Diagnosis not present

## 2023-09-17 DIAGNOSIS — H35033 Hypertensive retinopathy, bilateral: Secondary | ICD-10-CM

## 2023-09-17 DIAGNOSIS — D631 Anemia in chronic kidney disease: Secondary | ICD-10-CM | POA: Diagnosis not present

## 2023-09-17 DIAGNOSIS — B9689 Other specified bacterial agents as the cause of diseases classified elsewhere: Secondary | ICD-10-CM | POA: Diagnosis not present

## 2023-09-17 DIAGNOSIS — Z794 Long term (current) use of insulin: Secondary | ICD-10-CM

## 2023-09-17 DIAGNOSIS — I10 Essential (primary) hypertension: Secondary | ICD-10-CM | POA: Diagnosis not present

## 2023-09-17 DIAGNOSIS — D509 Iron deficiency anemia, unspecified: Secondary | ICD-10-CM | POA: Diagnosis not present

## 2023-09-17 DIAGNOSIS — N186 End stage renal disease: Secondary | ICD-10-CM | POA: Diagnosis not present

## 2023-09-17 DIAGNOSIS — R7881 Bacteremia: Secondary | ICD-10-CM | POA: Diagnosis not present

## 2023-09-17 DIAGNOSIS — H33103 Unspecified retinoschisis, bilateral: Secondary | ICD-10-CM

## 2023-09-17 DIAGNOSIS — Z961 Presence of intraocular lens: Secondary | ICD-10-CM

## 2023-09-17 DIAGNOSIS — N2581 Secondary hyperparathyroidism of renal origin: Secondary | ICD-10-CM | POA: Diagnosis not present

## 2023-09-17 LAB — GLUCOSE, CAPILLARY
Glucose-Capillary: 223 mg/dL — ABNORMAL HIGH (ref 70–99)
Glucose-Capillary: 229 mg/dL — ABNORMAL HIGH (ref 70–99)
Glucose-Capillary: 302 mg/dL — ABNORMAL HIGH (ref 70–99)
Glucose-Capillary: 312 mg/dL — ABNORMAL HIGH (ref 70–99)

## 2023-09-17 LAB — HEPATITIS B SURFACE ANTIBODY, QUANTITATIVE: Hep B S AB Quant (Post): 155 m[IU]/mL

## 2023-09-17 MED ORDER — METHOCARBAMOL 500 MG PO TABS
750.0000 mg | ORAL_TABLET | Freq: Three times a day (TID) | ORAL | Status: DC | PRN
  Administered 2023-09-18 – 2023-09-22 (×2): 750 mg via ORAL
  Filled 2023-09-17 (×2): qty 2

## 2023-09-17 MED ORDER — INSULIN GLARGINE-YFGN 100 UNIT/ML ~~LOC~~ SOLN
4.0000 [IU] | Freq: Every day | SUBCUTANEOUS | Status: DC
  Administered 2023-09-17 – 2023-09-19 (×3): 4 [IU] via SUBCUTANEOUS
  Filled 2023-09-17 (×4): qty 0.04

## 2023-09-17 MED ORDER — INSULIN ASPART 100 UNIT/ML IJ SOLN
0.0000 [IU] | Freq: Every day | INTRAMUSCULAR | Status: DC
  Administered 2023-09-17: 4 [IU] via SUBCUTANEOUS
  Administered 2023-09-18: 3 [IU] via SUBCUTANEOUS
  Administered 2023-09-20: 2 [IU] via SUBCUTANEOUS
  Administered 2023-09-21: 3 [IU] via SUBCUTANEOUS
  Administered 2023-09-22: 2 [IU] via SUBCUTANEOUS
  Administered 2023-09-23: 4 [IU] via SUBCUTANEOUS

## 2023-09-17 MED ORDER — INSULIN ASPART 100 UNIT/ML IJ SOLN
0.0000 [IU] | Freq: Three times a day (TID) | INTRAMUSCULAR | Status: DC
  Administered 2023-09-17: 2 [IU] via SUBCUTANEOUS
  Administered 2023-09-17: 4 [IU] via SUBCUTANEOUS
  Administered 2023-09-18 – 2023-09-19 (×3): 2 [IU] via SUBCUTANEOUS
  Administered 2023-09-19: 4 [IU] via SUBCUTANEOUS
  Administered 2023-09-19: 1 [IU] via SUBCUTANEOUS
  Administered 2023-09-20 (×2): 4 [IU] via SUBCUTANEOUS
  Administered 2023-09-21: 2 [IU] via SUBCUTANEOUS
  Administered 2023-09-21 (×2): 4 [IU] via SUBCUTANEOUS
  Administered 2023-09-22 (×3): 3 [IU] via SUBCUTANEOUS
  Administered 2023-09-23 (×3): 4 [IU] via SUBCUTANEOUS

## 2023-09-17 NOTE — Inpatient Diabetes Management (Signed)
Inpatient Diabetes Program Recommendations  AACE/ADA: New Consensus Statement on Inpatient Glycemic Control   Target Ranges:  Prepandial:   less than 140 mg/dL      Peak postprandial:   less than 180 mg/dL (1-2 hours)      Critically ill patients:  140 - 180 mg/dL    Latest Reference Range & Units 09/16/23 07:34 09/16/23 11:34 09/16/23 17:12 09/16/23 21:00 09/17/23 07:37  Glucose-Capillary 70 - 99 mg/dL 409 (H) 811 (H) 914 (H) 209 (H) 223 (H)   Review of Glycemic Control  Diabetes history: DM2 Outpatient Diabetes medications: Glipizide 5 mg QAM, Tresiba 20 units daily Current orders for Inpatient glycemic control: Novolog 0-6 units TID with meals  Inpatient Diabetes Program Recommendations:    Insulin: If CBGs remain consistently over 180 mg/dl with Novolog correction, may want to consider ordering Semglee 5 units Q24H.  Thanks, Orlando Penner, RN, MSN, CDCES Diabetes Coordinator Inpatient Diabetes Program [phone] (Team Pager from 8am to 5pm)

## 2023-09-17 NOTE — Progress Notes (Signed)
   [facility] has been requested to perform a transesophageal echocardiogram on Patricia [name] for bacteremia. After careful review of history and examination, the risks and benefits of transesophageal echocardiogram have been explained including risks of esophageal damage, perforation (1:10,000 risk), bleeding, pharyngeal hematoma as well as other potential complications associated with conscious sedation including aspiration, arrhythmia, respiratory failure and death. Alternatives to treatment were discussed, questions were answered. Patient is willing to proceed. Reviewed with patient's mom and son who were at the bedside as well.   Labs on 09/16/2023 showed Na+ low at 131. K+ 4.2. Hemoglobin stable at 8.8 with platelets at 176K. No requirement for pressor for support. TEE has been scheduled for 09/18/2023 at 0930 with Dr. Diona Browner  [name], PA-C  09/17/2023 1:30 PM

## 2023-09-17 NOTE — H&P (View-Only) (Signed)
   [facility] has been requested to perform a transesophageal echocardiogram on Patricia [name] for bacteremia. After careful review of history and examination, the risks and benefits of transesophageal echocardiogram have been explained including risks of esophageal damage, perforation (1:10,000 risk), bleeding, pharyngeal hematoma as well as other potential complications associated with conscious sedation including aspiration, arrhythmia, respiratory failure and death. Alternatives to treatment were discussed, questions were answered. Patient is willing to proceed. Reviewed with patient's mom and son who were at the bedside as well.   Labs on 09/16/2023 showed Na+ low at 131. K+ 4.2. Hemoglobin stable at 8.8 with platelets at 176K. No requirement for pressor for support. TEE has been scheduled for 09/18/2023 at 0930 with Dr. Diona Browner  [name], PA-C  09/17/2023 1:30 PM

## 2023-09-17 NOTE — Progress Notes (Signed)
Pharmacy Antibiotic Note  Patricia [name] is a 52 y.o. female admitted on 09/14/2023 with MSSA bacteremia.  Patient has a past medical history of HTN, DM2 and ESRD on hemodialysis. Pharmacy has been consulted for  cefazolin.  Plan: Continue Ancef 1g q24 for MSSA No dose adjust expected Follow up length of therapy    Height: 5\' 3"  (160 cm) Weight: 112.1 kg (247 lb 2.2 oz) IBW/kg (Calculated) : 52.4  Temp (24hrs), Avg:98.5 F (36.9 C), Min:98.1 F (36.7 C), Max:99.2 F (37.3 C)  Recent Labs  Lab 09/14/23 1716 09/14/23 1842 09/15/23 0611 09/16/23 1319  WBC 22.0*  --  22.1* 13.1*  CREATININE 10.30*  --  11.65* 14.29*  LATICACIDVEN 2.9* 1.4  --   --     Estimated Creatinine Clearance: 5.5 mL/min (A) (by C-G formula based on SCr of 14.29 mg/dL (H)).    Allergies  Allergen Reactions   Norvasc [Amlodipine Besylate] Rash and Other (See Comments)    Dizziness    Reglan [Metoclopramide] Other (See Comments)    Hallucinations     Antimicrobials this admission: 9/28 Vanc >> 9/29 9/28 Cefepime >> 9/29 9/29 Cefazolin>>  Dose adjustments this admission: None  Microbiology results: 9/28 BC- gram positive cocci; BCID Staph aureus   Thank you for allowing pharmacy to be a part of this patient's care.  Sheppard Coil PharmD., BCPS Clinical Pharmacist 09/17/2023 9:05 AM

## 2023-09-17 NOTE — Progress Notes (Signed)
Patient was having episodes of desaturation on nasal cannula. Patient agreeable to trying a full face mask. She is tolerating well at this time. She is still going to have her son bring her mask from home in tomorrow.

## 2023-09-17 NOTE — Progress Notes (Signed)
[facility]  Discussed dialysis plans and [facility] plans with team. Dr. Ronalee Belts is aiming for Thursday for replacement and dialysis with IR vs Vascular. If something changes with this plan, I can place a temporary in the femoral area if needed (R internal jugular occluded).    Algis Greenhouse, MD [facility] [address] [place], [place] [address] [phone] (office)

## 2023-09-17 NOTE — Progress Notes (Signed)
[facility] NEPHROLOGY PROGRESS NOTE  Assessment/ Plan: Pt is a 52 y.o. yo female   ESRD on HD MWF [facility] here with MSSA bacteremia and strong suspicion of TDC exit site and tunnel infection.   # Sepsis/MSSA bacteremia/TDC exit site and tunnel infection: TDC was removed on 9/30 and now in line holiday.  On cefazolin, likely TEE per ID.  # ESRD MWF: Received HD yesterday, off schedule.  We will continue to maintain line holiday with plan to do next HD on Thursday.  If cultures remains negative by Thursday morning, then we will place tunneled HD catheter.  Consult IR.  # Anemia: Hemoglobin dropped, monitor and dose ESA with next HD.  # CKD-MBD/secondary hyperparathyroidism: Continue sevelamer for hyperphosphatemia and on Cinacalcet.  Monitor lab.  # HTN/volume: UF with HD and monitor BP.  Blood pressure was low with sepsis now improving.  Discussed with the multiple care team members via secure chat including surgeon, ID, primary team and nursing staffs.  Subjective: Seen and examined in ICU.  Patient reports generalized body pain.  Denies nausea, vomiting, chest pain or shortness of breath. Objective Vital signs in last 24 hours: Vitals:   09/17/23 0330 09/17/23 0400 09/17/23 0500 09/17/23 0700  BP:  (!) 141/62 (!) 167/96 (!) 154/71  Pulse: 87 84 81 81  Resp: (!) 27 16 (!) 33   Temp:  98.8 F (37.1 C)    TempSrc:  Oral    SpO2: 98% 98% 93% 93%  Weight:      Height:       Weight change: 0 kg  Intake/Output Summary (Last 24 hours) at 09/17/2023 0837 Last data filed at 09/17/2023 0501 Gross per 24 hour  Intake 460 ml  Output 2200 ml  Net -1740 ml       Labs: RENAL PANEL Recent Labs  Lab 09/14/23 1716 09/15/23 0611 09/16/23 1319  NA 129* 129* 131*  K 3.6 4.2 4.2  CL 90* 89* 90*  CO2 20* 20* 17*  GLUCOSE 257* 215* 188*  BUN 44* 57* 88*  CREATININE 10.30* 11.65* 14.29*  CALCIUM 9.0 9.1 9.2  PHOS  --   --  7.6*  ALBUMIN 3.8  --  3.1*     Liver Function Tests: Recent Labs  Lab 09/14/23 1716 09/16/23 1319  AST 17  --   ALT 13  --   ALKPHOS 88  --   BILITOT 0.4  --   PROT 7.7  --   ALBUMIN 3.8 3.1*   No results for input(s): "LIPASE", "AMYLASE" in the last 168 hours. No results for input(s): "AMMONIA" in the last 168 hours. CBC: Recent Labs    11/19/22 0831 11/21/22 0427 08/13/23 0700 08/13/23 0704 09/14/23 1716 09/15/23 0611 09/16/23 1319  HGB  --    < > 11.6* 10.9* 8.2* 9.9* 8.8*  MCV  --    < >  --   --  92.7 92.5 89.0  VITAMINB12 675  --   --   --   --   --   --   FOLATE 35.2  --   --   --   --   --   --   FERRITIN 950*  --   --   --   --   --   --   TIBC 236*  --   --   --   --   --   --   IRON 34  --   --   --   --   --   --    < > =  values in this interval not displayed.    Cardiac Enzymes: No results for input(s): "CKTOTAL", "CKMB", "CKMBINDEX", "TROPONINI" in the last 168 hours. CBG: Recent Labs  Lab 09/16/23 0734 09/16/23 1134 09/16/23 1712 09/16/23 2100 09/17/23 0737  GLUCAP 156* 204* 134* 209* 223*    Iron Studies: No results for input(s): "IRON", "TIBC", "TRANSFERRIN", "FERRITIN" in the last 72 hours. Studies/Results: DG Chest Port 1 View  Result Date: 09/16/2023 CLINICAL DATA:  Encounter removal of vascular catheter. EXAM: PORTABLE CHEST 1 VIEW COMPARISON:  Radiograph 06/14/2023 FINDINGS: The previous left-sided dialysis catheter is been removed. No evidence of pneumothorax. Soft tissue attenuation from habitus limits detailed assessment. Cardiomegaly is stable. Suspected vascular congestion. No large pleural effusion. IMPRESSION: 1. The left-sided dialysis catheter has been removed. No evidence of pneumothorax. 2. Cardiomegaly with vascular congestion. Electronically Signed   By: Narda Rutherford M.D.   On: 09/16/2023 19:14   ECHOCARDIOGRAM COMPLETE  Result Date: 09/15/2023    ECHOCARDIOGRAM REPORT   Patient Name:   Patricia [name] Date of Exam: 09/15/2023 Medical Rec #:  [id number]    Height:       63.0 in Accession #:    [id number]  Weight:       245.0 lb Date of Birth:  05/02/1971  BSA:          2.108 m Patient Age:    51 years    BP:           125132/4757 mmHg Patient Gender: F           HR:           93 bpm. [facility] Procedure: 2D Echo, Cardiac Doppler and Color Doppler Indications:    Fever R50.9  History:        Patient has prior history of Echocardiogram examinations, most                 recent 11/19/2022. CHF; Risk Factors:Hypertension, Diabetes and                 Dyslipidemia. ESRD on dialysis [facility]), Breast cancer (HCC), OSA                 (obstructive sleep apnea), Covid-19.  Sonographer:    [name] RCS Referring Phys: [id number] MAURICIO DANIEL ARRIEN IMPRESSIONS  1. Left ventricular ejection fraction, by estimation, is 60 to 65%. The left ventricle has normal function. The left ventricle has no regional wall motion abnormalities. There is mild concentric left ventricular hypertrophy. Left ventricular diastolic parameters are consistent with Grade I diastolic dysfunction (impaired relaxation).  2. Right ventricular systolic function is normal. The right ventricular size is normal. Tricuspid regurgitation signal is inadequate for assessing PA pressure.  3. No evidence of mitral valve regurgitation.  4. The aortic valve is grossly normal. Aortic valve regurgitation is not visualized. Mild aortic valve stenosis.  5. The inferior vena cava is normal in size with greater than 50% respiratory variability, suggesting right atrial pressure of 3 mmHg. Comparison(s): No significant change from prior study. Conclusion(s)/Recommendation(s): No evidence of valvular vegetations on this transthoracic echocardiogram. Consider a transesophageal echocardiogram to exclude infective endocarditis if clinically indicated. FINDINGS  Left Ventricle: Left ventricular ejection fraction, by estimation, is 60 to 65%. The left ventricle has normal function. The left ventricle has no  regional wall motion abnormalities. The left ventricular internal cavity size was normal in size. There is  mild concentric left ventricular hypertrophy. Left ventricular diastolic parameters are consistent with  Grade I diastolic dysfunction (impaired relaxation). Right Ventricle: The right ventricular size is normal. Right ventricular systolic function is normal. Tricuspid regurgitation signal is inadequate for assessing PA pressure. Left Atrium: Left atrial size was normal in size. Right Atrium: Right atrial size was normal in size. Pericardium: There is no evidence of pericardial effusion. Mitral Valve: Mild mitral annular calcification. No evidence of mitral valve regurgitation. Tricuspid Valve: Tricuspid valve regurgitation is not demonstrated. Aortic Valve: The aortic valve is grossly normal. Aortic valve regurgitation is not visualized. Mild aortic stenosis is present. Aortic valve mean gradient measures 10.3 mmHg. Aortic valve peak gradient measures 21.8 mmHg. Aortic valve area, by VTI measures 1.93 cm. Pulmonic Valve: Pulmonic valve regurgitation is not visualized. Aorta: The aortic root is normal in size and structure. Venous: The inferior vena cava is normal in size with greater than 50% respiratory variability, suggesting right atrial pressure of 3 mmHg. IAS/Shunts: The interatrial septum was not well visualized.  LEFT VENTRICLE PLAX 2D LVIDd:         4.50 cm   Diastology LVIDs:         2.50 cm   LV e' medial:    3.59 cm/s LV PW:         1.30 cm   LV E/e' medial:  25.1 LV IVS:        1.20 cm   LV e' lateral:   8.32 cm/s LVOT diam:     1.90 cm   LV E/e' lateral: 10.8 LV SV:         76 LV SV Index:   36 LVOT Area:     2.84 cm  RIGHT VENTRICLE RV S prime:     15.60 cm/s TAPSE (M-mode): 2.6 cm LEFT ATRIUM             Index        RIGHT ATRIUM           Index LA diam:        4.20 cm 1.99 cm/m   RA Area:     22.20 cm LA Vol (A2C):   53.1 ml 25.19 ml/m  RA Volume:   72.50 ml  34.40 ml/m LA Vol (A4C):    45.9 ml 21.78 ml/m LA Biplane Vol: 50.5 ml 23.96 ml/m  AORTIC VALVE AV Area (Vmax):    1.84 cm AV Area (Vmean):   1.91 cm AV Area (VTI):     1.93 cm AV Vmax:           233.67 cm/s AV Vmean:          144.000 cm/s AV VTI:            0.394 m AV Peak Grad:      21.8 mmHg AV Mean Grad:      10.3 mmHg LVOT Vmax:         151.50 cm/s LVOT Vmean:        96.850 cm/s LVOT VTI:          0.268 m LVOT/AV VTI ratio: 0.68  AORTA Ao Root diam: 3.00 cm MITRAL VALVE MV Area (PHT): 2.42 cm    SHUNTS MV Decel Time: 313 msec    Systemic VTI:  0.27 m MV E velocity: 90.20 cm/s  Systemic Diam: 1.90 cm MV A velocity: 91.70 cm/s MV E/A ratio:  0.98 [place] signed by Carolan Clines Signature Date/Time: 09/15/2023/11:00:06 AM    Final     Medications: Infusions:  anticoagulant sodium citrate  ceFAZolin (ANCEF) IV 100 mL/hr at 09/17/23 0501    Scheduled Medications:  aspirin EC  81 mg Oral Daily   Chlorhexidine Gluconate Cloth  6 each Topical Daily   cinacalcet  30 mg Oral Q breakfast   heparin injection (subcutaneous)  5,000 Units Subcutaneous Q8H   insulin aspart  0-6 Units Subcutaneous TID WC   loratadine  10 mg Oral Daily   montelukast  10 mg Oral QHS   pantoprazole  40 mg Oral Daily   rOPINIRole  2 mg Oral QHS   rosuvastatin  20 mg Oral Daily   sevelamer carbonate  1,600 mg Oral BID WC   sevelamer carbonate  2,400 mg Oral Q lunch    have reviewed scheduled and prn medications.  Physical Exam: General:NAD, comfortable Heart:RRR, s1s2 nl Lungs:clear b/l, no crackle Abdomen:soft, Non-tender, non-distended Extremities:No edema Dialysis Access: No dialysis line currently.  Left AV fistula has bruit and thrill present but unable to cannulate.  Patricia [name] 09/17/2023,8:37 AM  Patricia: 3 days

## 2023-09-17 NOTE — TOC Initial Note (Signed)
Transition of Care [facility]) - Initial/Assessment Note    Patient Details  Name: Patricia [name] MRN: [id number] Date of Birth: [birth year]  Transition of Care [facility]) CM/SW Contact:    Karn Cassis, LCSW Phone Number: 09/17/2023, 8:18 AM  Clinical Narrative:  Pt admitted with severe sepsis. Assessment completed due to high risk readmission score. Pt reports she lives with her. She is independent with ADLs at baseline and drives herself to appointments. Pt is on 2L home O2 ([employer]). She plans to return home when medically stable. Pt's dialysis is at [facility] on MWF schedule. TOC following for possible IV antibiotics at d/c.                  Expected Discharge Plan: Home/Self Care Barriers to Discharge: Continued Medical Work up   Patient Goals and CMS Choice Patient states their goals for this hospitalization and ongoing recovery are:: return home   Choice offered to / list presented to : Patient [place] ownership interest in [facility].provided to::  (n/a)    Expected Discharge Plan and Services In-house Referral: Clinical Social Work     Living arrangements for the past 2 months: Apartment                                      Prior Living Arrangements/Services Living arrangements for the past 2 months: Apartment Lives with:: Adult Children Patient language and need for interpreter reviewed:: Yes Do you feel safe going back to the place where you live?: Yes      Need for Family Participation in Patient Care: No (Comment)   Current home services: DME (cpap, O2) Criminal Activity/Legal Involvement Pertinent to Current Situation/Hospitalization: No - Comment as needed  Activities of Daily Living   ADL Screening (condition at time of admission) Does the patient have a NEW difficulty with bathing/dressing/toileting/self-feeding that is expected to last >3 days?: No Does the patient have a NEW difficulty with getting in/out of bed, walking,  or climbing stairs that is expected to last >3 days?: No Does the patient have a NEW difficulty with communication that is expected to last >3 days?: No Is the patient deaf or have difficulty hearing?: No Does the patient have difficulty seeing, even when wearing glasses/contacts?: No Does the patient have difficulty concentrating, remembering, or making decisions?: No  Permission Sought/Granted                  Emotional Assessment     Affect (typically observed): Appropriate Orientation: : Oriented to Self, Oriented to Place, Oriented to  Time, Oriented to Situation Alcohol / Substance Use: Not Applicable Psych Involvement: No (comment)  Admission diagnosis:  Respiratory failure (HCC) [J96.90] Sepsis (HCC) [A41.9] Acute on chronic respiratory failure with hypoxia (HCC) [J96.21] Sepsis, due to unspecified organism, unspecified whether acute organ dysfunction present [facility]) [A41.9] Patient Active Problem List   Diagnosis Date Noted   Bacteremia 09/16/2023   Respiratory failure (HCC) 09/14/2023   Severe sepsis (HCC) 09/14/2023   Type 2 diabetes mellitus with hyperlipidemia (HCC) 09/14/2023   Acute anemia 06/03/2023   Complication of AV dialysis fistula 05/26/2023   Anemia of chronic disease 05/26/2023   Mixed hyperlipidemia 03/23/2023   Normocytic anemia 02/12/2023   Abdominal pain 02/12/2023   Chronic respiratory failure with hypoxia (HCC) 01/15/2023   Other specified disorders of bone density and structure, other site 01/02/2023   Acute on chronic  hypoxic respiratory failure (HCC) 12/10/2022   Precordial chest pain 11/26/2022   ESRD needing dialysis (HCC) 11/20/2022   Class 3 obesity 11/19/2022   BOOP (bronchiolitis obliterans with organizing pneumonia) (HCC) 11/19/2022   Bronchiolitis obliterans (HCC) 11/19/2022   Post-traumatic osteoarthritis, right ankle and foot 08/31/2022   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   Elevated alkaline phosphatase level 05/24/2022   Right foot  sprain 12/20/2020   Contusion of left shoulder 12/20/2020   Syncope 08/03/2020   Acute pulmonary edema (HCC) 01/11/2020   OSA (obstructive sleep apnea)    Closed right ankle fracture 10/06/2018   Closed displaced trimalleolar fracture of right ankle 10/06/2018   Acute colitis 02/26/2018   Hypoxemia 02/24/2018   Dyspnea 11/26/2017   Acute on chronic anemia 11/26/2017   Elevated troponin 11/26/2017   Hyperkalemia 09/29/2017   Acute on chronic respiratory failure with hypoxia (HCC) 09/28/2017   CAP (community acquired pneumonia) 09/26/2017   Volume overload 09/25/2017   Acute on chronic diastolic CHF (congestive heart failure) (HCC) 09/25/2017   Lactic acidosis 09/25/2017   Hypokalemia 09/25/2017   Breast cancer (HCC) 07/22/2017   SOB (shortness of breath)    Acute respiratory failure with hypoxia (HCC) 07/21/2017   Flatulence 10/25/2016   GERD (gastroesophageal reflux disease) 08/02/2016   Pyloric stenosis, acquired    Nausea with vomiting    Generalized abdominal pain    Chest pain, rule out acute myocardial infarction 07/04/2016   Constipation 07/04/2016   Nausea 07/04/2016   Essential hypertension 07/04/2016   HCAP (healthcare-associated pneumonia) 12/04/2015   ESRD on dialysis (HCC) 12/04/2015   Diabetes mellitus with end-stage renal disease (HCC) 12/04/2015   PCP:  [name], PA-C Pharmacy:   [facility] [phone] - [place],  - [address] [place] [address] Phone: [phone] Fax: [phone]     Social Determinants of Health (SDOH) Social History: SDOH Screenings   Food Insecurity: No Food Insecurity (09/14/2023)  Housing: Low Risk  (09/14/2023)  Transportation Needs: No Transportation Needs (09/14/2023)  Utilities: Not At Risk (09/14/2023)  Financial Resource Strain: Low Risk  (06/13/2023)  Physical Activity: Unknown (06/16/2019)   Received from [facility],  [facility]  Social Connections: Unknown (06/16/2019)   Received from [facility], [facility]  Tobacco Use: Low Risk  (09/14/2023)   SDOH Interventions:     Readmission Risk Interventions    09/17/2023    8:16 AM 05/27/2023    4:48 PM 12/11/2022    1:40 PM  Readmission Risk Prevention Plan  Transportation Screening Complete Complete Complete  Medication Review (RN Care Manager) Complete Referral to Pharmacy Complete  PCP or Specialist appointment within 3-5 days of discharge  Complete   HRI or Home Care Consult Complete Complete Complete  SW Recovery Care/Counseling Consult Complete Complete Complete  Palliative Care Screening Not Applicable Not Applicable Not Applicable  Skilled Nursing Facility Not Applicable Not Applicable Not Applicable

## 2023-09-17 NOTE — Progress Notes (Signed)
RN called to let RT know that patient could not tolerate the full face mask and that she had placed patient back on nasal cannula.

## 2023-09-17 NOTE — Progress Notes (Signed)
Progress Note   PatientOrchid [name] VHQ:[id number] DOB: 10-06-1971 DOA: 09/14/2023     3 DOS: the patient was seen and examined on 09/17/2023   Brief hospital admission narrative: As per H&P written by Dr. [name] on 09/14/2023 Patricia [name] is a 52 y.o. female with medical history significant of ESRD on hemodialysis, (M,W,F), obesity class 3, T2DM, hypertension and asthma who presented with dyspnea, fevers and left neck pain.    Patient has a left upper extremity fistula that has been malfunctioning, she underwent Fistulogram on 08.27.24 by Dr [name] and then had a follow up with with vascular surgery in [facility] 09/03/23, plan for further follow up with vascular ultrasound.  In the meanwhile she has been getting HD through a tunneled catheter in her left internal jugular vein.    Patient was noted to have a low grade fever on Wednesday, 09/25 during dialysis. She was treated with acetaminophen and completed HD. During that session, initially her left upper extremity fistula was used but she developed pain and discomfort, not able to complete HD by this route, then completed her treatment through her HD catheter.    The following day she felt a little better, but still not back to baseline, she continue having chills and generalized malaise.  Yesterday she had several episodes of vomiting prior to HD. She was medicated with ondansetron, acetaminophen and lomotil. She was able to complete her HD treatment through her HD catheter.    At home she continue to have chills and now developed pain in her left shoulder area, radiating into her head and left ear. No improving factors.   Assessment and Plan: * Severe sepsis secondary to MSSA bacteremia (HCC) -Severe sepsis with elevated lactic acid, present on admission.  -Continue IV antibiotics at the moment Ancef -Continue supportive care and follow clinical response -Dialysis catheter has been removed and following recommendations by ID TEE has been  requested -2D echo now demonstrating vegetations. -Stable vital signs. -Blood cultures has been repeated; so far no growth.  Acute on chronic hypoxemic and hypercapnic respiratory failure. (Sepsis related).  -Continue supplemental 02 per .  -Continue CPAP nightly.  ESRD on dialysis [facility]) Clinically patient with no signs of volume overload and is stable electrolytes at the moment -Patient chronically Monday, Wednesday and Friday for dialysis purposes -Last treatment on 09/16/2023 -Nephrology service consulted and will follow recommendations. -Patient's hemodialysis catheter successfully removed after dialysis treatment on 09/16/2023.  Essential hypertension -Currently stable -Follow blood pressure and adjust antihypertensive regimen as needed.  GERD (gastroesophageal reflux disease) -Continue with PPI  -Lifestyle changes discussed with patient.  Type 2 diabetes mellitus with hyperlipidemia (HCC) -Continue sliding scale insulin and follow CBGs fluctuation with further adjustment to hypoglycemic regimen as required.  Class 3 obesity (HCC) -Body mass index is 43.78 kg/m. -Low-calorie diet, portion control and increase physical activity discussed with patient.  OSA -Continue CPAP nightly. -Patient instructed to be compliant. -Alert and following commands appropriately; no somnolence appreciated.   Subjective:  Afebrile, no nausea, no vomiting and expressing no shortness of breath.  Good oxygen saturation on chronic supplementation.  Complaining of generalized muscle ache and left side of the neck discomfort.  Physical Exam: Vitals:   09/17/23 1614 09/17/23 1658 09/17/23 1700 09/17/23 1749  BP:  (!) 139/43 (!) 154/41   Pulse:    85  Resp:  (!) 23 (!) 35 (!) 26  Temp: 98.2 F (36.8 C)     TempSrc: Oral     SpO2:  92%  93%  Weight:      Height:       General exam: Alert, awake, oriented x 3; reporting no nausea, no vomiting.  Still having left-sided neck discomfort for  along with generalized muscle ache.  Patient reports no shortness of breath and is currently afebrile. Respiratory system: Good saturation on chronic supplementation; no using accessory muscles. Cardiovascular system:RRR. No rubs or gallops; no JVD. Gastrointestinal system: Abdomen is obese, nondistended, soft and nontender. No organomegaly or masses felt. Normal bowel sounds heard. Central nervous system: Alert and oriented. No focal neurological deficits. Extremities: No cyanosis or clubbing.  Trace edema appreciated bilaterally. Skin: No petechiae. Psychiatry: Judgement and insight appear normal. Mood & affect appropriate.   Latest data Reviewed: CBC: WBCs 13.1, hemoglobin 8.8 and platelet count 176K Renal function panel: Sodium 131, potassium 4.2, chloride 90, bicarb 17, glucose 188, BUN 88, creatinine 14.29 and GFR 3  Family Communication: No family at bedside.  Disposition: Status is: Inpatient Remains inpatient appropriate because: Continue IV antibiotics.   Planned Discharge Destination: Home  Time spent: 50 minutes  Author: Vassie Loll, MD 09/17/2023 5:52 PM  For on call review [email or web address].

## 2023-09-17 NOTE — Progress Notes (Signed)
Patient unable to tolerate CPAP mask. RN placed patient back on nasal cannula. This RT had discussed with patient earlier in the shift about having her home mask brought in tomorrow 10/1 so she would be more comfortable and have a better fitting mask. She was wearing the only style and size mask we have available.

## 2023-09-17 NOTE — Anesthesia Preprocedure Evaluation (Signed)
Anesthesia Evaluation  Patient identified by MRN, date of birth, ID band Patient awake    Reviewed: Allergy & Precautions, H&P , NPO status , Patient's Chart, lab work & pertinent test results, reviewed documented beta blocker date and time   History of Anesthesia Complications (+) PONV and history of anesthetic complications  Airway Mallampati: IV  TM Distance: >3 FB Neck ROM: Limited    Dental no notable dental hx. (+) Dental Advisory Given   Pulmonary shortness of breath, asthma , sleep apnea    Pulmonary exam normal breath sounds clear to auscultation       Cardiovascular Exercise Tolerance: Good hypertension, Normal cardiovascular exam Rhythm:regular Rate:Normal     Neuro/Psych negative neurological ROS  negative psych ROS   GI/Hepatic Neg liver ROS,GERD  ,,  Endo/Other  diabetes, Type 2  Morbid obesity  Renal/GU ESRF and DialysisRenal diseasenegative Renal ROSDialyzed Monday.  negative genitourinary   Musculoskeletal   Abdominal Normal abdominal exam  (+)   Peds  Hematology negative hematology ROS (+)   Anesthesia Other Findings Breast cancer  Reproductive/Obstetrics negative OB ROS                             Anesthesia Physical Anesthesia Plan  ASA: 4  Anesthesia Plan: General   Post-op Pain Management:    Induction: Intravenous  PONV Risk Score and Plan: Propofol infusion  Airway Management Planned: Nasal Cannula and Natural Airway  Additional Equipment:   Intra-op Plan:   Post-operative Plan:   Informed Consent: I have reviewed the patients History and Physical, chart, labs and discussed the procedure including the risks, benefits and alternatives for the proposed anesthesia with the patient or authorized representative who has indicated his/her understanding and acceptance.     Dental Advisory Given  Plan Discussed with: CRNA  Anesthesia Plan Comments:         Anesthesia Quick Evaluation

## 2023-09-18 ENCOUNTER — Inpatient Hospital Stay (HOSPITAL_COMMUNITY): Payer: Self-pay | Admitting: Certified Registered Nurse Anesthetist

## 2023-09-18 ENCOUNTER — Encounter (HOSPITAL_COMMUNITY): Payer: Self-pay | Admitting: Internal Medicine

## 2023-09-18 ENCOUNTER — Other Ambulatory Visit (HOSPITAL_COMMUNITY): Payer: Medicare Other

## 2023-09-18 ENCOUNTER — Other Ambulatory Visit (HOSPITAL_COMMUNITY): Payer: Self-pay | Admitting: *Deleted

## 2023-09-18 ENCOUNTER — Inpatient Hospital Stay (HOSPITAL_COMMUNITY): Payer: Medicare Other | Admitting: Certified Registered Nurse Anesthetist

## 2023-09-18 ENCOUNTER — Inpatient Hospital Stay (HOSPITAL_COMMUNITY): Payer: Medicare Other

## 2023-09-18 ENCOUNTER — Encounter (HOSPITAL_COMMUNITY)
Admission: EM | Disposition: A | Discharge status: Home / Self Care | Payer: Self-pay | Source: Home / Self Care | Attending: Internal Medicine

## 2023-09-18 DIAGNOSIS — R7881 Bacteremia: Secondary | ICD-10-CM | POA: Diagnosis not present

## 2023-09-18 DIAGNOSIS — I361 Nonrheumatic tricuspid (valve) insufficiency: Secondary | ICD-10-CM | POA: Diagnosis not present

## 2023-09-18 DIAGNOSIS — N186 End stage renal disease: Secondary | ICD-10-CM | POA: Diagnosis not present

## 2023-09-18 DIAGNOSIS — A419 Sepsis, unspecified organism: Secondary | ICD-10-CM | POA: Diagnosis not present

## 2023-09-18 DIAGNOSIS — R6520 Severe sepsis without septic shock: Secondary | ICD-10-CM | POA: Diagnosis not present

## 2023-09-18 DIAGNOSIS — E66813 Obesity, class 3: Secondary | ICD-10-CM | POA: Diagnosis not present

## 2023-09-18 HISTORY — PX: TEE WITHOUT CARDIOVERSION: SHX5443

## 2023-09-18 LAB — BASIC METABOLIC PANEL
Anion gap: 20 — ABNORMAL HIGH (ref 5–15)
BUN: 79 mg/dL — ABNORMAL HIGH (ref 6–20)
CO2: 21 mmol/L — ABNORMAL LOW (ref 22–32)
Calcium: 9.1 mg/dL (ref 8.9–10.3)
Chloride: 90 mmol/L — ABNORMAL LOW (ref 98–111)
Creatinine, Ser: 11.94 mg/dL — ABNORMAL HIGH (ref 0.44–1.00)
GFR, Estimated: 3 mL/min — ABNORMAL LOW (ref >60)
Glucose, Bld: 235 mg/dL — ABNORMAL HIGH (ref 70–99)
Potassium: 4.1 mmol/L (ref 3.5–5.1)
Sodium: 131 mmol/L — ABNORMAL LOW (ref 135–145)

## 2023-09-18 LAB — GLUCOSE, CAPILLARY
Glucose-Capillary: 211 mg/dL — ABNORMAL HIGH (ref 70–99)
Glucose-Capillary: 194 mg/dL — ABNORMAL HIGH (ref 70–99)
Glucose-Capillary: 180 mg/dL — ABNORMAL HIGH (ref 70–99)
Glucose-Capillary: 220 mg/dL — ABNORMAL HIGH (ref 70–99)
Glucose-Capillary: 263 mg/dL — ABNORMAL HIGH (ref 70–99)

## 2023-09-18 LAB — CBC
HCT: 28.1 % — ABNORMAL LOW (ref 36.0–46.0)
Hemoglobin: 8.7 g/dL — ABNORMAL LOW (ref 12.0–15.0)
MCH: 28.1 pg (ref 26.0–34.0)
MCHC: 31 g/dL (ref 30.0–36.0)
MCV: 90.6 fL (ref 80.0–100.0)
Platelets: 231 10*3/uL (ref 150–400)
RBC: 3.1 MIL/uL — ABNORMAL LOW (ref 3.87–5.11)
RDW: 14.7 % (ref 11.5–15.5)
WBC: 15 10*3/uL — ABNORMAL HIGH (ref 4.0–10.5)
nRBC: 0 % (ref 0.0–0.2)

## 2023-09-18 LAB — CULTURE, BLOOD (ROUTINE X 2)

## 2023-09-18 LAB — C-REACTIVE PROTEIN: CRP: 20 mg/dL — ABNORMAL HIGH (ref <1.0)

## 2023-09-18 LAB — SEDIMENTATION RATE: Sed Rate: 103 mm/h — ABNORMAL HIGH (ref 0–22)

## 2023-09-18 LAB — ECHO TEE

## 2023-09-18 SURGERY — ECHOCARDIOGRAM, TRANSESOPHAGEAL
Anesthesia: General

## 2023-09-18 MED ORDER — PROPOFOL 500 MG/50ML IV EMUL
INTRAVENOUS | Status: AC
  Filled 2023-09-18: qty 50

## 2023-09-18 MED ORDER — BUTAMBEN-TETRACAINE-BENZOCAINE 2-2-14 % EX AERO
INHALATION_SPRAY | CUTANEOUS | Status: AC
  Filled 2023-09-18: qty 5

## 2023-09-18 MED ORDER — PROPOFOL 10 MG/ML IV BOLUS
INTRAVENOUS | Status: DC | PRN
Start: 2023-09-18 — End: 2023-09-18
  Administered 2023-09-18 (×2): 30 mg via INTRAVENOUS
  Administered 2023-09-18: 40 mg via INTRAVENOUS

## 2023-09-18 MED ORDER — NIFEDIPINE ER OSMOTIC RELEASE 30 MG PO TB24
30.0000 mg | ORAL_TABLET | Freq: Once | ORAL | Status: AC
  Administered 2023-09-18: 30 mg via ORAL
  Filled 2023-09-18: qty 1

## 2023-09-18 MED ORDER — CHLORHEXIDINE GLUCONATE CLOTH 2 % EX PADS
6.0000 | MEDICATED_PAD | Freq: Every day | CUTANEOUS | Status: DC
  Administered 2023-09-19 – 2023-09-22 (×3): 6 via TOPICAL

## 2023-09-18 MED ORDER — HYDRALAZINE HCL 20 MG/ML IJ SOLN
5.0000 mg | Freq: Once | INTRAMUSCULAR | Status: AC
  Administered 2023-09-18: 5 mg via INTRAVENOUS
  Filled 2023-09-18: qty 1

## 2023-09-18 MED ORDER — SODIUM CHLORIDE 0.9 % IV SOLN: INTRAVENOUS | Status: DC

## 2023-09-18 MED ORDER — ORAL CARE MOUTH RINSE: 15.0000 mL | Freq: Once | OROMUCOSAL | Status: DC

## 2023-09-18 MED ORDER — LABETALOL HCL 200 MG PO TABS
100.0000 mg | ORAL_TABLET | Freq: Two times a day (BID) | ORAL | Status: DC
  Administered 2023-09-18 – 2023-09-23 (×12): 100 mg via ORAL
  Filled 2023-09-18 (×3): qty 1
  Filled 2023-09-18: qty 0.5
  Filled 2023-09-18 (×2): qty 1
  Filled 2023-09-18 (×2): qty 0.5
  Filled 2023-09-18 (×4): qty 1
  Filled 2023-09-18: qty 0.5
  Filled 2023-09-18: qty 1
  Filled 2023-09-18: qty 0.5
  Filled 2023-09-18: qty 1
  Filled 2023-09-18: qty 0.5
  Filled 2023-09-18 (×2): qty 1

## 2023-09-18 MED ORDER — PROPOFOL 500 MG/50ML IV EMUL
INTRAVENOUS | Status: DC | PRN
  Administered 2023-09-18: 50 ug/kg/min via INTRAVENOUS

## 2023-09-18 MED ORDER — LACTATED RINGERS IV SOLN: INTRAVENOUS | Status: DC

## 2023-09-18 MED ORDER — BUTAMBEN-TETRACAINE-BENZOCAINE 2-2-14 % EX AERO
INHALATION_SPRAY | CUTANEOUS | Status: DC | PRN
  Administered 2023-09-18: 2 via TOPICAL

## 2023-09-18 MED ORDER — CHLORHEXIDINE GLUCONATE 0.12 % MT SOLN: 15.0000 mL | Freq: Once | OROMUCOSAL | Status: DC

## 2023-09-18 NOTE — Consult Note (Signed)
ORTHOPAEDIC CONSULTATION  REQUESTING PHYSICIAN: Catarina Hartshorn, MD  ASSESSMENT AND PLAN: 52 y.o. female with the following: Acute right knee pain  Orthopaedics consulted for evaluation of right knee pain.  She was admitted to the ICU due to an infection.  Source has yet to be determined.  She complained of acute onset right knee pain.  On exam, she does not have an effusion.  The knee is not red or warm to the touch.  She is sitting with her right knee fully extended.  I am able to flex the knee to 90 degrees with mild discomfort.  Based on current presentation, low likelihood of a septic joint.  Recommend against an aspiration at this time.   I am available to review again if symptoms change  - Weight Bearing Status/Activity: WBAT, ROM as tolerated  - Additional recommended labs/tests: repeat CBC, ESR, CRP  -VTE Prophylaxis: Per hospitalist  - Pain control: As needed  - Follow-up plan: As needed  -Procedures: None  Chief Complaint: Right knee pain  HPI: Elliyanna Toriz is a 52 y.o. female with PMH as listed below who was admitted to the hospital due to an infection.  She was found to be septic.  She has been admitted to the ICU.  2 days ago, she states she started to have pain in the right knee.  It hurts to bear weight.  She does not note and swelling.  She has fevers and chills.  No source the infection yet.   Past Medical History:  Diagnosis Date   Anemia    Ankle fracture    Arthritis    Asthma    Blood transfusion without reported diagnosis    Breast cancer (HCC)    Cancer (HCC)    COVID    Diabetes mellitus without complication (HCC)    Dyspnea    End stage renal disease on dialysis (HCC)    M/W/F [facility] in [place]   GERD (gastroesophageal reflux disease)    Hypertension    OSA (obstructive sleep apnea)    uses CPAP sometimes   Pneumonia    PONV (postoperative nausea and vomiting)    Wears glasses    Past Surgical History:  Procedure Laterality Date   A/V  FISTULAGRAM Left 08/13/2023   Procedure: A/V Fistulagram;  Surgeon: Nada Libman, MD;  Location: [facility];  Service: Cardiovascular;  Laterality: Left;   ABDOMINAL HYSTERECTOMY     APPLICATION OF WOUND VAC Left 05/26/2023   Procedure: APPLICATION OF WOUND VAC;  Surgeon: Victorino Sparrow, MD;  Location: [facility] OR;  Service: Vascular;  Laterality: Left;   AV FISTULA PLACEMENT  11/2014   at [facility] hospital   AV FISTULA REPAIR N/A 04/2021   BALLOON DILATION N/A 07/10/2016   Procedure: BALLOON DILATION;  Surgeon: West Bali, MD;  Location: [facility];  Service: Endoscopy;  Laterality: N/A;  Pyloric dilation   BREAST LUMPECTOMY     CATARACT EXTRACTION W/PHACO Right 05/24/2023   Procedure: CATARACT EXTRACTION PHACO AND INTRAOCULAR LENS PLACEMENT (IOC);  Surgeon: [name], MD;  Location: [facility] ORS;  Service: Ophthalmology;  Laterality: Right;  CDE: 5.00   CESAREAN SECTION     CHOLECYSTECTOMY     COLONOSCOPY  02/2023   COLONOSCOPY WITH PROPOFOL N/A 09/27/2016   Dr. Jena Gauss: Internal hemorrhoids repeat colonoscopy in 10 years   COLONOSCOPY WITH PROPOFOL N/A 02/28/2023   Procedure: COLONOSCOPY WITH PROPOFOL;  Surgeon: Corbin Ade, MD;  Location: [facility];  Service:  Endoscopy;  Laterality: N/A;  11:45 am, asa 3 dialysis pt   DILATION AND CURETTAGE OF UTERUS     ESOPHAGOGASTRODUODENOSCOPY N/A 07/10/2016   Dr.Fields- normal esophagus, gastric stenosis was found at the pylorus, gastritis on bx, normal examined duodenun   ESOPHAGOGASTRODUODENOSCOPY (EGD) WITH PROPOFOL N/A 11/21/2022   Procedure: ESOPHAGOGASTRODUODENOSCOPY (EGD) WITH PROPOFOL;  Surgeon: Dolores Frame, MD;  Location: [facility];  Service: Gastroenterology;  Laterality: N/A;   EXTERNAL FIXATION REMOVAL Right 10/29/2018   Procedure: REMOVAL RIGHT ANKLE BIOMET ZIMMER EXTERNAL FIXATOR, SHORT LEG CAST APPLICATION;  Surgeon: Eldred Manges, MD;  Location: [facility] OR;  Service: Orthopedics;  Laterality: Right;    INSERTION OF DIALYSIS CATHETER  05/26/2023   Procedure: INSERTION OF Left internal jugular DIALYSIS CATHETER;  Surgeon: Victorino Sparrow, MD;  Location: [facility] OR;  Service: Vascular;;   IR FLUORO GUIDE CV LINE LEFT  08/28/2023   IR US GUIDE VASC ACCESS LEFT  08/28/2023   MASTECTOMY     left sided   ORIF ANKLE FRACTURE Right 10/06/2018   Procedure: OPEN REDUCTION INTERNAL FIXATION (ORIF) RIGHT ANKLE TRIMALLEOLAR;  Surgeon: Eldred Manges, MD;  Location: [facility] OR;  Service: Orthopedics;  Laterality: Right;   PERIPHERAL VASCULAR BALLOON ANGIOPLASTY  08/13/2023   Procedure: PERIPHERAL VASCULAR BALLOON ANGIOPLASTY;  Surgeon: Nada Libman, MD;  [facility]: [facility];  Service: Cardiovascular;;   REVISON OF ARTERIOVENOUS FISTULA Left 05/26/2023   Procedure: REVISON OF ARTERIOVENOUS FISTULA LEFT ARM HEMATOMA WASH OUT;  Surgeon: Victorino Sparrow, MD;  Location: [facility] OR;  Service: Vascular;  Laterality: Left;   RIGHT/LEFT HEART CATH AND CORONARY ANGIOGRAPHY N/A 11/27/2022   Procedure: RIGHT/LEFT HEART CATH AND CORONARY ANGIOGRAPHY;  Surgeon: Swazil[place], Peter M, MD;  Location: [facility] INVASIVE CV LAB;  Service: Cardiovascular;  Laterality: N/A;   Social History   Socioeconomic History   Marital status: Widowed    Spouse name: Not on file   Number of children: Not on file   Years of education: Not on file   Highest education level: Not on file  Occupational History   Not on file  Tobacco Use   Smoking status: Never   Smokeless tobacco: Never  Vaping Use   Vaping status: Never Used  Substance and Sexual Activity   Alcohol use: No    Alcohol/week: 0.0 standard drinks of alcohol   Drug use: No   Sexual activity: Not Currently  Other Topics Concern   Not on file  Social History Narrative   Not on file   Social Determinants of Health   Financial Resource Strain: Low Risk  (06/13/2023)   Overall Financial Resource Strain (CARDIA)    Difficulty of Paying Living Expenses: Not very hard  Food Insecurity:  No Food Insecurity (09/14/2023)   Hunger Vital Sign    Worried About Running Out of Food in the Last Year: Never true    Ran Out of Food in the Last Year: Never true  Transportation Needs: No Transportation Needs (09/14/2023)   PRAPARE - Administrator, Civil Service (Medical): No    Lack of Transportation (Non-Medical): No  Physical Activity: Unknown (06/16/2019)   Received from [facility], [facility]   Exercise Vital Sign    Days of Exercise per Week: 0 days    Minutes of Exercise per Session: Not on file  Stress: Not on file  Social Connections: Unknown (06/16/2019)   Received from [facility], [facility]  Social Advertising account executive [NHANES]    Frequency of Communication with Friends and Family: Patient declined    Frequency of Social Gatherings with Friends and Family: Patient declined    Attends Religious Services: Patient declined    Database administrator or Organizations: Patient declined    Attends Engineer, structural: Patient declined    Marital Status: Patient declined   Family History  Problem Relation Age of Onset   Diabetes Mellitus II Mother    Hypertension Mother    Heart block Mother    Hypertension Sister    Hypertension Sister    Colon cancer Neg Hx    Allergies  Allergen Reactions   Norvasc [Amlodipine Besylate] Rash and Other (See Comments)    Dizziness    Reglan [Metoclopramide] Other (See Comments)    Hallucinations    Prior to Admission medications   Medication Sig Start Date End Date Taking? Authorizing Provider  acetaminophen (TYLENOL) 325 MG tablet Take 650 mg by mouth every 6 (six) hours as needed for mild pain or fever.   Yes [provider]  albuterol (VENTOLIN HFA) 108 (90 Base) MCG/ACT inhaler Inhale 2 puffs into the lungs every 6 (six) hours as needed for wheezing or shortness of breath. 01/15/23  Yes Parrett, Virgel Bouquet, NP   ALPRAZolam (XANAX) 0.5 MG tablet Take 0.5 mg by mouth at bedtime as needed for anxiety or sleep. 07/19/23  Yes [provider]  aspirin EC 81 MG tablet Take 1 tablet (81 mg total) by mouth daily. Swallow whole. 12/07/21  Yes Pricilla Riffle, MD  cetirizine (ZYRTEC) 10 MG tablet Take 1 tablet (10 mg total) by mouth daily. 06/21/23  Yes Coralyn Helling, MD  Cholecalciferol (VITAMIN D-3 PO) Take 1 tablet by mouth daily.   Yes [provider]  cinacalcet (SENSIPAR) 30 MG tablet Take 30 mg by mouth daily.   Yes [provider]  docusate sodium (COLACE) 100 MG capsule Take 100 mg by mouth daily.   Yes [provider]  ferric citrate (AURYXIA) 1 GM 210 MG(Fe) tablet Take 420 mg by mouth 2 (two) times daily with a meal. With Breakfast & with supper   Yes [provider]  glipiZIDE (GLUCOTROL) 5 MG tablet Take 1 tablet (5 mg total) by mouth daily before breakfast. 05/21/23  Yes Nida, Denman George, MD  insulin degludec (TRESIBA FLEXTOUCH) 200 UNIT/ML FlexTouch Pen Inject 20 Units into the skin at bedtime. 08/06/23  Yes Roma Kayser, MD  ipratropium (ATROVENT) 0.06 % nasal spray Place 2 sprays into both nostrils daily as needed for rhinitis.   Yes [provider]  irbesartan (AVAPRO) 75 MG tablet Take 75 mg by mouth See admin instructions. Take 75 mg at night on non-dialysis days (Sunday, Tuesday, Thursday, Saturday) 01/18/23  Yes [provider]  labetalol (NORMODYNE) 100 MG tablet Take 100 mg by mouth 2 (two) times daily.   Yes [provider]  linaclotide (LINZESS) 290 MCG CAPS capsule Take 290 mcg by mouth daily before breakfast.   Yes [provider]  montelukast (SINGULAIR) 10 MG tablet Take 1 tablet (10 mg total) by mouth at bedtime. 04/22/23  Yes Coralyn Helling, MD  multivitamin (RENA-VIT) TABS tablet Take 1 tablet by mouth daily.   Yes [provider]  NIFEdipine (ADALAT CC) 30 MG 24 hr tablet Take 30 mg by mouth See  admin instructions. Take 30 mg at night on non-dialysis days (Sunday, Tuesday, Thursday, Saturday) 11/26/21  Yes [provider]  ondansetron (ZOFRAN-ODT) 4 MG disintegrating tablet Take 1 tablet (4 mg total) by mouth every 8 (eight) hours as needed for nausea or vomiting. 02/19/23  Yes Gelene Mink, NP  OXYGEN Inhale 2 L into the lungs continuous.   Yes [provider]  pantoprazole (PROTONIX) 40 MG tablet TAKE 1 TABLET (40 MG TOTAL) BY MOUTH DAILY 30 MINUTES BEFORE BREAKFAST 01/10/23  Yes Rourk, Gerrit Friends, MD  Polyvinyl Alcohol-Povidone (REFRESH OP) Place 1 drop into both eyes daily as needed (dry eyes).   Yes [provider]  rOPINIRole (REQUIP XL) 2 MG 24 hr tablet Take 2 mg by mouth at bedtime.  09/10/16  Yes [provider]  rosuvastatin (CRESTOR) 20 MG tablet Take 20 mg by mouth daily.   Yes [provider]  sevelamer carbonate (RENVELA) 800 MG tablet Take 1,600-2,400 mg by mouth See admin instructions. Take 1600 mg at breakfast, 2400 mg at lunch and 1600 mg at dinner   Yes [provider]   DG Knee 1-2 Views Right  Result Date: 09/18/2023 CLINICAL DATA:  Right knee pain.  No known injury. EXAM: RIGHT KNEE - 1-2 VIEW COMPARISON:  None Available. FINDINGS: There is diffuse decreased bone mineralization. No joint effusion. Joint spaces are preserved. No acute fracture or dislocation. High-grade atherosclerotic calcifications. IMPRESSION: 1. No significant osteoarthritis. 2. No acute fracture or dislocation. 3. High-grade atherosclerotic calcifications. Electronically Signed   By: Neita Garnet M.D.   On: 09/18/2023 17:59   ECHO TEE  Result Date: 09/18/2023    TRANSESOPHOGEAL ECHO REPORT   Patient Name:   [facility] Date of Exam: 09/18/2023 Medical Rec #:  [id number]   Height:       63.0 in Accession #:    [id number]  Weight:       247.1 lb Date of Birth:  Dec 11, 1971  BSA:          2.116 m Patient Age:    51 years    BP:           165/81 mmHg  Patient Gender: F           HR:           91 bpm. [facility] Procedure: Transesophageal Echo, Cardiac Doppler and Color Doppler Indications:    Bacteremia R78.81  History:        Patient has prior history of Echocardiogram examinations, most                 recent 09/15/2023. CHF; Risk Factors:Hypertension, Diabetes and                 Dyslipidemia. ESRD on dialysis [facility]), Breast cancer (HCC), OSA                 (obstructive sleep apnea), Covid-19.  Sonographer:    [name] RCS Referring Phys: [id number] [name] PROCEDURE: After discussion of the risks and benefits of a TEE, an informed consent was obtained from the patient. TEE procedure time was 7 minutes. The transesophogeal probe was passed without difficulty through the esophogus of the patient. Imaged were  obtained with the patient in a left lateral decubitus position. Local oropharyngeal anesthetic was provided with viscous lidocaine. Sedation performed by different physician. The patient was monitored while under deep sedation. Image quality was good. The patient's vital signs; including heart rate, blood pressure, and oxygen saturation; remained stable throughout the procedure. The patient developed no complications during the procedure.  IMPRESSIONS  1. Left ventricular ejection fraction, by estimation, is 60 to 65%. The left ventricle has normal function. The left ventricle has no regional wall motion abnormalities. There is mild concentric left ventricular hypertrophy.  2. Right ventricular systolic function is normal. The right ventricular size is normal.  3. Left atrial size was mildly dilated. No left atrial/left atrial appendage thrombus was detected. The LAA emptying velocity was 70 cm/s.  4. Right atrial size was mildly dilated.  5. The mitral valve is grossly normal. Trivial mitral valve regurgitation.  6. The aortic valve is tricuspid. There is mild calcification of the aortic valve. Aortic valve regurgitation is not  visualized. Aortic valve sclerosis/calcification is present, without any evidence of aortic stenosis.  7. There is Moderate (Grade III) plaque involving the descending aorta. Conclusion(s)/Recommendation(s): No valvular vegetations. FINDINGS  Left Ventricle: Left ventricular ejection fraction, by estimation, is 60 to 65%. The left ventricle has normal function. The left ventricle has no regional wall motion abnormalities. The left ventricular internal cavity size was normal in size. There is  mild concentric left ventricular hypertrophy. Right Ventricle: The right ventricular size is normal. Right vetricular wall thickness was not well visualized. Right ventricular systolic function is normal. Left Atrium: Left atrial size was mildly dilated. No left atrial/left atrial appendage thrombus was detected. The LAA emptying velocity was 70 cm/s. Right Atrium: Right atrial size was mildly dilated. Pericardium: There is no evidence of pericardial effusion. Mitral Valve: The mitral valve is grossly normal. Trivial mitral valve regurgitation. Tricuspid Valve: The tricuspid valve is grossly normal. Tricuspid valve regurgitation is mild. Aortic Valve: The aortic valve is tricuspid. There is mild calcification of the aortic valve. Aortic valve regurgitation is not visualized. Aortic valve sclerosis/calcification is present, without any evidence of aortic stenosis. Pulmonic Valve: The pulmonic valve was grossly normal. Pulmonic valve regurgitation is trivial. Aorta: The aortic root is normal in size and structure. There is moderate (Grade III) plaque involving the descending aorta. IAS/Shunts: No atrial level shunt detected by color flow Doppler. Additional Comments: Spectral Doppler performed. Nona Dell MD Electronically signed by Nona Dell MD Signature Date/Time: 09/18/2023/12:57:59 PM    Final     Family History Reviewed and non-contributory, no pertinent history of problems with bleeding or  anesthesia    Review of Systems + fevers or chills No numbness or tingling No chest pain No shortness of breath No bowel or bladder dysfunction No GI distress No headaches    OBJECTIVE  Vitals:Patient Vitals for the past 8 hrs:  BP Temp Temp src Pulse Resp SpO2  09/18/23 1900 (!) 163/44 -- -- 80 (!) 44 (!) 84 %  09/18/23 1800 (!) 154/45 -- -- 82 (!) 25 91 %  09/18/23 1730 -- -- -- 85 16 94 %  09/18/23 1725 -- -- -- 86 (!) 23 92 %  09/18/23 1700 (!) 190/73 -- -- 88 (!) 24 93 %  09/18/23 1643 (!) 184/36 -- -- 84 (!) 35 92 %  09/18/23 1635 -- 98.2 F (36.8 C) Oral -- -- --  09/18/23 1600 (!) 191/83 -- -- 87 -- (!) 89 %  09/18/23 1500 (!) 201/36 -- -- 84 (!) 24 (!) 88 %  09/18/23 1430 -- -- -- 82 (!) 30 90 %  09/18/23 1425 -- -- -- 85 (!) 22 (!) 89 %  09/18/23 1408 -- -- -- 81 (!) 31 91 %  09/18/23 1405 -- -- -- 82 (!) 21 (!) 88 %  09/18/23 1400 (!) 187/71 -- --  84 (!) 30 (!) 86 %  09/18/23 1358 (!) 196/56 -- -- 81 (!) 31 (!) 87 %  09/18/23 1350 -- -- -- 80 16 94 %  09/18/23 1345 (!) 165/93 -- -- 81 16 95 %  09/18/23 1300 (!) 173/75 -- -- 81 (!) 33 92 %  09/18/23 1230 (!) 170/59 -- -- 79 15 91 %  09/18/23 1158 (!) 148/65 98.4 F (36.9 C) -- -- (!) 22 93 %   General: Alert, no acute distress Cardiovascular: Warm extremities noted Respiratory: No cyanosis, no use of accessory musculature GI: No organomegaly, abdomen is soft and non-tender Skin: No lesions in the area of chief complaint other than those listed below in MSK exam.  Neurologic: Sensation intact distally save for the below mentioned MSK exam Psychiatric: Patient is competent for consent with normal mood and affect Lymphatic: No swelling obvious and reported other than the area involved in the exam below Extremities   WUJ:WJXBJ knee without effusion.  Diffuse tenderness.  Can achieve full extension.  Tolerates flexion to 90 degrees.  No erythema.      Test Results Imaging  XR of the right knee is without  acute injury.  No effusion.  Minimal degenerative changes within the medial or lateral compartment.   Labs cbc Recent Labs    09/16/23 1319 09/18/23 1622  WBC 13.1* 15.0*  HGB 8.8* 8.7*  HCT 27.5* 28.1*  PLT 176 231     Recent Labs    09/16/23 1319 09/18/23 0856  NA 131* 131*  K 4.2 4.1  CL 90* 90*  CO2 17* 21*  GLUCOSE 188* 235*  BUN 88* 79*  CREATININE 14.29* 11.94*  CALCIUM 9.2 9.1

## 2023-09-18 NOTE — Care Management Important Message (Signed)
Important Message  Patient Details  Name: Patricia [name] MRN: [id number] Date of Birth: [birth year]   Important Message Given:  Yes - Medicare IM (patient not in room, left copy on bedside table)     Corey Harold 09/18/2023, 10:37 AM

## 2023-09-18 NOTE — Progress Notes (Signed)
Patient is wide awake from taking a late evening nap. Stated she does not want to go on the CPAP right now. Discussed with patient and RN that I would come put her on machine if/when she got sleepy again and especially if her O2 levels started to drop.

## 2023-09-18 NOTE — Anesthesia Postprocedure Evaluation (Signed)
Anesthesia Post Note  Patient: Myan Skerritt  Procedure(s) Performed: TRANSESOPHAGEAL ECHOCARDIOGRAM (TEE)  Patient location during evaluation: PACU Anesthesia Type: General Level of consciousness: awake and alert Pain management: pain level controlled Vital Signs Assessment: post-procedure vital signs reviewed and stable Respiratory status: spontaneous breathing, nonlabored ventilation, respiratory function stable and patient connected to nasal cannula oxygen Cardiovascular status: blood pressure returned to baseline and stable Postop Assessment: no apparent nausea or vomiting Anesthetic complications: no   There were no known notable events for this encounter.   Last Vitals:  Vitals:   09/18/23 1158 09/18/23 1230  BP: (!) 148/65   Pulse:  79  Resp: (!) 22 15  Temp: 36.9 C   SpO2: 93% 91%    Last Pain:  Vitals:   09/18/23 1158  TempSrc:   PainSc: 0-No pain                 Myking Sar L Nathifa Ritthaler

## 2023-09-18 NOTE — Inpatient Diabetes Management (Signed)
Inpatient Diabetes Program Recommendations  AACE/ADA: New Consensus Statement on Inpatient Glycemic Control  Target Ranges:  Prepandial:   less than 140 mg/dL      Peak postprandial:   less than 180 mg/dL (1-2 hours)      Critically ill patients:  140 - 180 mg/dL    Latest Reference Range & Units 09/17/23 07:37 09/17/23 12:08 09/17/23 16:01 09/17/23 21:04 09/18/23 07:48  Glucose-Capillary 70 - 99 mg/dL 191 (H) 478 (H) 295 (H) 312 (H) 211 (H)   Review of Glycemic Control  Diabetes history: DM2 Outpatient Diabetes medications: Glipizide 5 mg QAM, Tresiba 20 units daily Current orders for Inpatient glycemic control: Semglee 4 units at bedtime, Novolog 0-6 units TID with meals, Novolog 0-5 units QHS   Inpatient Diabetes Program Recommendations:     Insulin: If CBGs remain consistently over 180 mg/dl please consider increasing Semglee to 8 units at bedtime.   Thanks, Orlando Penner, RN, MSN, CDCES Diabetes Coordinator Inpatient Diabetes Program [phone] (Team Pager from 8am to 5pm)

## 2023-09-18 NOTE — Interval H&P Note (Signed)
History and Physical Interval Note:  09/18/2023 12:58 PM  Patricia [name]  has presented today for surgery, with the diagnosis of bacteremia.  The various methods of treatment have been discussed with the patient and family. After consideration of risks, benefits and other options for treatment, the patient has consented to  Procedure(s): TRANSESOPHAGEAL ECHOCARDIOGRAM (TEE) (N/A) as a surgical intervention.  The patient's history has been reviewed, patient examined, no change in status, stable for surgery.  I have reviewed the patient's chart and labs.  Questions were answered to the patient's satisfaction.     Nona Dell

## 2023-09-18 NOTE — Transfer of Care (Signed)
Immediate Anesthesia Transfer of Care Note  Patient: Patricia [name]  Procedure(s) Performed: TRANSESOPHAGEAL ECHOCARDIOGRAM (TEE)  Patient Location: PACU  Anesthesia Type:General  Level of Consciousness: drowsy  Airway & Oxygen Therapy: Patient Spontanous Breathing and Patient connected to nasal cannula oxygen  Post-op Assessment: Report given to RN and Post -op Vital signs reviewed and stable  Post vital signs: Reviewed and stable  Last Vitals:  Vitals Value Taken Time  BP 150/67   Temp 98.4   Pulse 81   Resp 20   SpO2 100%     Last Pain:  Vitals:   09/18/23 1027  TempSrc: Oral  PainSc: 8       Patients Stated Pain Goal: 0 (09/17/23 2000)  Complications: No notable events documented. Pt recovery in OR; Pt connected to [facility] [place]

## 2023-09-18 NOTE — Progress Notes (Signed)
*  PRELIMINARY RESULTS* Echocardiogram Echocardiogram Transesophageal has been performed.  Stacey Drain 09/18/2023, 12:31 PM

## 2023-09-18 NOTE — Progress Notes (Signed)
[facility] NEPHROLOGY PROGRESS NOTE  Assessment/ Plan: Pt is a 52 y.o. yo female   ESRD on HD MWF [facility] here with MSSA bacteremia and strong suspicion of TDC exit site and tunnel infection.   # Sepsis/MSSA bacteremia/TDC exit site and tunnel infection: TDC was removed on 9/30 currently in line holiday.  On cefazolin, plan for TEE today.  # ESRD MWF: Received HD on 9/30. Blood culture remains negative w/plan for TEE today. IR consulted for [facility] placement likely later today or tomorrow. Plan for dialysis tomorrow off schedule. The patient needs to be transferred to [facility] IR for [facility]. I spoke with IR already.   # Anemia: Hemoglobin dropped, monitor and dose ESA with next HD.  # CKD-MBD/secondary hyperparathyroidism: Continue sevelamer for hyperphosphatemia and on Cinacalcet.  Monitor lab.  # HTN/volume: UF with HD and monitor BP.  Resume nifedipine at home dose.   D/w IR, nurse and primary team/  Subjective: Seen and examined in ICU. No new event. Denies CP, SOB, N/V. Noted plan for TEE Today. Her mother was presented with her.   Objective Vital signs in last 24 hours: Vitals:   09/18/23 0700 09/18/23 0800 09/18/23 0809 09/18/23 0812  BP: (!) 164/67 (!) 159/71    Pulse: 76 80 81   Resp: (!) 32 (!) 25 (!) 27   Temp:    98 F (36.7 C)  TempSrc:    Oral  SpO2: 94% 92% 92%   Weight:      Height:       Weight change:   Intake/Output Summary (Last 24 hours) at 09/18/2023 0920 Last data filed at 09/18/2023 0500 Gross per 24 hour  Intake 510 ml  Output --  Net 510 ml       Labs: RENAL PANEL Recent Labs  Lab 09/14/23 1716 09/15/23 0611 09/16/23 1319  NA 129* 129* 131*  K 3.6 4.2 4.2  CL 90* 89* 90*  CO2 20* 20* 17*  GLUCOSE 257* 215* 188*  BUN 44* 57* 88*  CREATININE 10.30* 11.65* 14.29*  CALCIUM 9.0 9.1 9.2  PHOS  --   --  7.6*  ALBUMIN 3.8  --  3.1*    Liver Function Tests: Recent Labs  Lab 09/14/23 1716 09/16/23 1319  AST 17  --    ALT 13  --   ALKPHOS 88  --   BILITOT 0.4  --   PROT 7.7  --   ALBUMIN 3.8 3.1*   No results for input(s): "LIPASE", "AMYLASE" in the last 168 hours. No results for input(s): "AMMONIA" in the last 168 hours. CBC: Recent Labs    11/19/22 0831 11/21/22 0427 08/13/23 0700 08/13/23 0704 09/14/23 1716 09/15/23 0611 09/16/23 1319  HGB  --    < > 11.6* 10.9* 8.2* 9.9* 8.8*  MCV  --    < >  --   --  92.7 92.5 89.0  VITAMINB12 675  --   --   --   --   --   --   FOLATE 35.2  --   --   --   --   --   --   FERRITIN 950*  --   --   --   --   --   --   TIBC 236*  --   --   --   --   --   --   IRON 34  --   --   --   --   --   --    < > =  values in this interval not displayed.    Cardiac Enzymes: No results for input(s): "CKTOTAL", "CKMB", "CKMBINDEX", "TROPONINI" in the last 168 hours. CBG: Recent Labs  Lab 09/17/23 0737 09/17/23 1208 09/17/23 1601 09/17/23 2104 09/18/23 0748  GLUCAP 223* 229* 302* 312* 211*    Iron Studies: No results for input(s): "IRON", "TIBC", "TRANSFERRIN", "FERRITIN" in the last 72 hours. Studies/Results: DG Chest Port 1 View  Result Date: 09/16/2023 CLINICAL DATA:  Encounter removal of vascular catheter. EXAM: PORTABLE CHEST 1 VIEW COMPARISON:  Radiograph 06/14/2023 FINDINGS: The previous left-sided dialysis catheter is been removed. No evidence of pneumothorax. Soft tissue attenuation from habitus limits detailed assessment. Cardiomegaly is stable. Suspected vascular congestion. No large pleural effusion. IMPRESSION: 1. The left-sided dialysis catheter has been removed. No evidence of pneumothorax. 2. Cardiomegaly with vascular congestion. Electronically Signed   By: Narda Rutherford M.D.   On: 09/16/2023 19:14    Medications: Infusions:  anticoagulant sodium citrate      ceFAZolin (ANCEF) IV Stopped (09/17/23 2028)    Scheduled Medications:  aspirin EC  81 mg Oral Daily   Chlorhexidine Gluconate Cloth  6 each Topical Daily   cinacalcet  30 mg Oral  Q breakfast   heparin injection (subcutaneous)  5,000 Units Subcutaneous Q8H   insulin aspart  0-5 Units Subcutaneous QHS   insulin aspart  0-6 Units Subcutaneous TID WC   insulin glargine-yfgn  4 Units Subcutaneous QHS   loratadine  10 mg Oral Daily   montelukast  10 mg Oral QHS   pantoprazole  40 mg Oral Daily   rOPINIRole  2 mg Oral QHS   rosuvastatin  20 mg Oral Daily   sevelamer carbonate  1,600 mg Oral BID WC   sevelamer carbonate  2,400 mg Oral Q lunch    have reviewed scheduled and prn medications.  Physical Exam: General:NAD, comfortable Heart:RRR, s1s2 nl Lungs:clear b/l, no crackle Abdomen:soft, Non-tender, non-distended Extremities:No edema Dialysis Access: No dialysis line currently.  Left AV fistula has bruit and thrill present but unable to cannulate.  Huxton Glaus Prasad Britney Captain 09/18/2023,9:20 AM  LOS: 4 days

## 2023-09-18 NOTE — CV Procedure (Signed)
Transesophageal echocardiogram  Indication: MSSA bacteremia  Description of procedure: After informed consent was obtained, the patient was taken to the OR suite where a timeout was performed.  She was placed in left lateral decubitus disposition.  Deep sedation and monitoring provided by the anesthesia service with use of propofol (please refer to anesthesia records for dosing details).  A multiplane transesophageal echocardiographic probe was inserted into the esophagus and multiple images were obtained.  Patient remained hemodynamically stable throughout and there were no immediate complications.  Results are as follows:   1. Left ventricular ejection fraction, by estimation, is 60 to 65%. The  left ventricle has normal function. The left ventricle has no regional  wall motion abnormalities. There is mild concentric left ventricular  hypertrophy.   2. Right ventricular systolic function is normal. The right ventricular  size is normal.   3. Left atrial size was mildly dilated. No left atrial/left atrial  appendage thrombus was detected. The LAA emptying velocity was 70 cm/s.   4. Right atrial size was mildly dilated.   5. The mitral valve is grossly normal. Trivial mitral valve  regurgitation.   6. The aortic valve is tricuspid. There is mild calcification of the  aortic valve. Aortic valve regurgitation is not visualized. Aortic valve  sclerosis/calcification is present, without any evidence of aortic  stenosis.   7. There is Moderate (Grade III) plaque involving the descending aorta.   Conclusion(s)/Recommendation(s): No valvular vegetations.   Disposition: After postprocedure monitoring the patient will be returned to the ICU for further care by primary team.  Jonelle Sidle, M.D., F.A.C.C.

## 2023-09-18 NOTE — Progress Notes (Addendum)
Name:   Patricia [name] Date of Exam: 09/15/2023 Medical Rec #:  [id number]   Height:       63.0 in Accession #:    [id number]  Weight:       245.0 lb Date of Birth:  1971/10/02  BSA:          2.108 m Patient Age:    51 years    BP:           125132/4757 mmHg Patient Gender: F           HR:           93 bpm. [facility] Procedure: 2D Echo, Cardiac Doppler and Color Doppler Indications:    Fever R50.9  History:        Patient has prior history of Echocardiogram examinations, most                 recent 11/19/2022. CHF; Risk Factors:Hypertension, Diabetes and                 Dyslipidemia. ESRD on dialysis [facility]), Breast cancer (HCC), OSA                 (obstructive sleep apnea), Covid-19.  Sonographer:    [name] RCS Referring Phys: [id number] MAURICIO DANIEL ARRIEN IMPRESSIONS  1. Left ventricular ejection fraction, by estimation, is 60 to 65%. The left ventricle has normal function. The left ventricle has no regional  wall motion abnormalities. There is mild concentric left ventricular hypertrophy. Left ventricular diastolic parameters are consistent with Grade I diastolic dysfunction (impaired relaxation).  2. Right ventricular systolic function is normal. The right ventricular size is normal. Tricuspid regurgitation signal is inadequate for assessing PA pressure.  3. No evidence of mitral valve regurgitation.  4. The aortic valve is grossly normal. Aortic valve regurgitation is not visualized. Mild aortic valve stenosis.  5. The inferior vena cava is normal in size with greater than 50% respiratory variability, suggesting right atrial pressure of 3 mmHg. Comparison(s): No significant change from prior study. Conclusion(s)/Recommendation(s): No evidence of valvular vegetations on this transthoracic echocardiogram. Consider a transesophageal echocardiogram to exclude infective endocarditis if clinically indicated. FINDINGS  Left Ventricle: Left ventricular ejection fraction, by estimation, is 60 to 65%. The left ventricle has normal function. The left ventricle has no regional wall motion abnormalities. The left ventricular internal cavity size was normal in size. There is  mild concentric left ventricular hypertrophy. Left ventricular diastolic parameters are consistent with Grade I diastolic dysfunction (impaired relaxation). Right Ventricle: The right ventricular size is normal. Right ventricular systolic function is normal. Tricuspid regurgitation signal is inadequate for assessing PA pressure. Left Atrium: Left atrial size was normal in size. Right Atrium: Right atrial size was normal in size. Pericardium: There is no evidence of pericardial effusion. Mitral Valve: Mild mitral annular calcification. No evidence of mitral valve regurgitation. Tricuspid Valve: Tricuspid valve regurgitation is not demonstrated. Aortic Valve: The aortic valve is grossly normal. Aortic valve regurgitation is not visualized. Mild aortic stenosis  is present. Aortic valve mean gradient measures 10.3 mmHg. Aortic valve peak gradient measures 21.8 mmHg. Aortic valve area, by VTI measures 1.93 cm. Pulmonic Valve: Pulmonic valve regurgitation is not visualized. Aorta: The aortic root is normal in size and structure. Venous: The inferior vena cava is normal in size with greater than 50% respiratory variability, suggesting right atrial pressure of 3 mmHg. IAS/Shunts: The interatrial septum was not well visualized.  LEFT VENTRICLE PLAX 2D LVIDd:  PROGRESS NOTE  Patricia [name] WGN:[id number] DOB: 03-Sep-1971 DOA: 09/14/2023 PCP: [name], PA-C  Brief History:  As per H&P written by Dr. [name] on 09/14/2023 Patricia [name] is a 52 y.o. female with medical history significant of ESRD on hemodialysis, (M,W,F), obesity class 3, T2DM, hypertension and asthma who presented with dyspnea, fevers and left neck pain.    Patient has a left upper extremity fistula that has been malfunctioning, she underwent Fistulogram on 08.27.24 by Dr [name] and then had a follow up with with vascular surgery in [facility] 09/03/23, plan for further follow up with vascular ultrasound.  In the meanwhile she has been getting HD through a tunneled catheter in her left internal jugular vein.    Patient was noted to have a low grade fever on Wednesday, 09/25 during dialysis. She was treated with acetaminophen and completed HD. During that session, initially her left upper extremity fistula was used but she developed pain and discomfort, not able to complete HD by this route, then completed her treatment through her HD catheter.    The following day she felt a little better, but still not back to baseline, she continue having chills and generalized malaise.  Yesterday she had several episodes of vomiting prior to HD. She was medicated with ondansetron, acetaminophen and lomotil. She was able to complete her HD treatment through her HD catheter.    At home she continue to have chills and now developed pain in her left shoulder area, radiating into her head and left ear. No improving factors.  Assessment/Plan: * Severe sepsis secondary to MSSA bacteremia (HCC) -Severe sepsis with elevated leukocytosis, lactic acid, present on admission.  -Continue IV antibiotics--Ancef -Continue supportive care and follow clinical response -Dialysis catheter has been removed 9/30 -10/2 TEE--no vegetations -TTE now demonstrating vegetations. -Stable vital signs. -repeat blood  cultures>>neg to date   Acute on chronic hypoxemic and hypercapnic respiratory failure. (Sepsis related).  -Continue supplemental 02  -chronically on 2L at home -currently on 4L -Continue CPAP nightly. -9/30 CXR - vascular congestion   ESRD on dialysis [facility]) -Patient chronically Monday, Wednesday and Friday for dialysis purposes -Last treatment on 09/16/2023 -Nephrology service consulted and will follow recommendations. -Patient's hemodialysis catheter successfully removed after dialysis treatment on 09/16/2023.  Right Knee pain -xray right knee -ESR -CRP -consult ortho   Essential hypertension -Currently stable -Follow blood pressure and adjust antihypertensive regimen as needed.   GERD (gastroesophageal reflux disease) -Continue with PPI  -Lifestyle changes discussed with patient.   Type 2 diabetes mellitus with hyperlipidemia (HCC) -Continue sliding scale insulin and follow CBGs fluctuation with further adjustment to hypoglycemic regimen as required.   Class 3 obesity (HCC) -Body mass index is 43.78 kg/m. -Low-calorie diet, portion control and increase physical activity discussed with patient.   OSA -Continue CPAP nightly. -Patient instructed to be compliant. -Alert and following commands appropriately; no somnolence appreciated.     Family Communication:   son at bedside updated 10/2  Consultants:  renal, general surgery, ID  Code Status:  FULL   DVT Prophylaxis:  Gibson Heparin    Procedures: As Listed in Progress Note Above  Antibiotics: Cefazolin 9/29>      Subjective: Pt complains of right knee pain--moderate to severe.  Started on 9/30.  Denies f/c, n/v/d, sob, cp  Objective: Vitals:   09/18/23 1408 09/18/23 1425 09/18/23 1430 09/18/23 1500  BP:    (!) 201/36  Pulse: 81 85 82 84  Name:   Patricia [name] Date of Exam: 09/15/2023 Medical Rec #:  [id number]   Height:       63.0 in Accession #:    [id number]  Weight:       245.0 lb Date of Birth:  1971/10/02  BSA:          2.108 m Patient Age:    51 years    BP:           125132/4757 mmHg Patient Gender: F           HR:           93 bpm. [facility] Procedure: 2D Echo, Cardiac Doppler and Color Doppler Indications:    Fever R50.9  History:        Patient has prior history of Echocardiogram examinations, most                 recent 11/19/2022. CHF; Risk Factors:Hypertension, Diabetes and                 Dyslipidemia. ESRD on dialysis [facility]), Breast cancer (HCC), OSA                 (obstructive sleep apnea), Covid-19.  Sonographer:    [name] RCS Referring Phys: [id number] MAURICIO DANIEL ARRIEN IMPRESSIONS  1. Left ventricular ejection fraction, by estimation, is 60 to 65%. The left ventricle has normal function. The left ventricle has no regional  wall motion abnormalities. There is mild concentric left ventricular hypertrophy. Left ventricular diastolic parameters are consistent with Grade I diastolic dysfunction (impaired relaxation).  2. Right ventricular systolic function is normal. The right ventricular size is normal. Tricuspid regurgitation signal is inadequate for assessing PA pressure.  3. No evidence of mitral valve regurgitation.  4. The aortic valve is grossly normal. Aortic valve regurgitation is not visualized. Mild aortic valve stenosis.  5. The inferior vena cava is normal in size with greater than 50% respiratory variability, suggesting right atrial pressure of 3 mmHg. Comparison(s): No significant change from prior study. Conclusion(s)/Recommendation(s): No evidence of valvular vegetations on this transthoracic echocardiogram. Consider a transesophageal echocardiogram to exclude infective endocarditis if clinically indicated. FINDINGS  Left Ventricle: Left ventricular ejection fraction, by estimation, is 60 to 65%. The left ventricle has normal function. The left ventricle has no regional wall motion abnormalities. The left ventricular internal cavity size was normal in size. There is  mild concentric left ventricular hypertrophy. Left ventricular diastolic parameters are consistent with Grade I diastolic dysfunction (impaired relaxation). Right Ventricle: The right ventricular size is normal. Right ventricular systolic function is normal. Tricuspid regurgitation signal is inadequate for assessing PA pressure. Left Atrium: Left atrial size was normal in size. Right Atrium: Right atrial size was normal in size. Pericardium: There is no evidence of pericardial effusion. Mitral Valve: Mild mitral annular calcification. No evidence of mitral valve regurgitation. Tricuspid Valve: Tricuspid valve regurgitation is not demonstrated. Aortic Valve: The aortic valve is grossly normal. Aortic valve regurgitation is not visualized. Mild aortic stenosis  is present. Aortic valve mean gradient measures 10.3 mmHg. Aortic valve peak gradient measures 21.8 mmHg. Aortic valve area, by VTI measures 1.93 cm. Pulmonic Valve: Pulmonic valve regurgitation is not visualized. Aorta: The aortic root is normal in size and structure. Venous: The inferior vena cava is normal in size with greater than 50% respiratory variability, suggesting right atrial pressure of 3 mmHg. IAS/Shunts: The interatrial septum was not well visualized.  LEFT VENTRICLE PLAX 2D LVIDd:  PROGRESS NOTE  Patricia [name] WGN:[id number] DOB: 03-Sep-1971 DOA: 09/14/2023 PCP: [name], PA-C  Brief History:  As per H&P written by Dr. [name] on 09/14/2023 Patricia [name] is a 52 y.o. female with medical history significant of ESRD on hemodialysis, (M,W,F), obesity class 3, T2DM, hypertension and asthma who presented with dyspnea, fevers and left neck pain.    Patient has a left upper extremity fistula that has been malfunctioning, she underwent Fistulogram on 08.27.24 by Dr [name] and then had a follow up with with vascular surgery in [facility] 09/03/23, plan for further follow up with vascular ultrasound.  In the meanwhile she has been getting HD through a tunneled catheter in her left internal jugular vein.    Patient was noted to have a low grade fever on Wednesday, 09/25 during dialysis. She was treated with acetaminophen and completed HD. During that session, initially her left upper extremity fistula was used but she developed pain and discomfort, not able to complete HD by this route, then completed her treatment through her HD catheter.    The following day she felt a little better, but still not back to baseline, she continue having chills and generalized malaise.  Yesterday she had several episodes of vomiting prior to HD. She was medicated with ondansetron, acetaminophen and lomotil. She was able to complete her HD treatment through her HD catheter.    At home she continue to have chills and now developed pain in her left shoulder area, radiating into her head and left ear. No improving factors.  Assessment/Plan: * Severe sepsis secondary to MSSA bacteremia (HCC) -Severe sepsis with elevated leukocytosis, lactic acid, present on admission.  -Continue IV antibiotics--Ancef -Continue supportive care and follow clinical response -Dialysis catheter has been removed 9/30 -10/2 TEE--no vegetations -TTE now demonstrating vegetations. -Stable vital signs. -repeat blood  cultures>>neg to date   Acute on chronic hypoxemic and hypercapnic respiratory failure. (Sepsis related).  -Continue supplemental 02  -chronically on 2L at home -currently on 4L -Continue CPAP nightly. -9/30 CXR - vascular congestion   ESRD on dialysis [facility]) -Patient chronically Monday, Wednesday and Friday for dialysis purposes -Last treatment on 09/16/2023 -Nephrology service consulted and will follow recommendations. -Patient's hemodialysis catheter successfully removed after dialysis treatment on 09/16/2023.  Right Knee pain -xray right knee -ESR -CRP -consult ortho   Essential hypertension -Currently stable -Follow blood pressure and adjust antihypertensive regimen as needed.   GERD (gastroesophageal reflux disease) -Continue with PPI  -Lifestyle changes discussed with patient.   Type 2 diabetes mellitus with hyperlipidemia (HCC) -Continue sliding scale insulin and follow CBGs fluctuation with further adjustment to hypoglycemic regimen as required.   Class 3 obesity (HCC) -Body mass index is 43.78 kg/m. -Low-calorie diet, portion control and increase physical activity discussed with patient.   OSA -Continue CPAP nightly. -Patient instructed to be compliant. -Alert and following commands appropriately; no somnolence appreciated.     Family Communication:   son at bedside updated 10/2  Consultants:  renal, general surgery, ID  Code Status:  FULL   DVT Prophylaxis:  Gibson Heparin    Procedures: As Listed in Progress Note Above  Antibiotics: Cefazolin 9/29>      Subjective: Pt complains of right knee pain--moderate to severe.  Started on 9/30.  Denies f/c, n/v/d, sob, cp  Objective: Vitals:   09/18/23 1408 09/18/23 1425 09/18/23 1430 09/18/23 1500  BP:    (!) 201/36  Pulse: 81 85 82 84  w Reflex to ID Panel     Status: None (Preliminary result)   Collection Time: 09/16/23  6:46 PM   Specimen: BLOOD  Result Value Ref Range Status   Specimen Description BLOOD BLOOD RIGHT HAND  Final   Special Requests   Final    BOTTLES DRAWN AEROBIC ONLY Blood Culture results may not be optimal due to an inadequate volume of blood received in culture bottles   Culture   Final    NO GROWTH 2 DAYS Performed at [facility], [address], [place], [place] [address]    Report Status PENDING  Incomplete  Culture, blood (Routine X 2) w Reflex to ID Panel     Status: None (Preliminary result)   Collection Time: 09/16/23  6:51 PM   Specimen: BLOOD  Result Value Ref Range Status   Specimen Description BLOOD BLOOD RIGHT WRIST  Final   Special Requests   Final    BOTTLES DRAWN AEROBIC AND ANAEROBIC Blood Culture adequate volume   Culture   Final    NO GROWTH 2 DAYS Performed at [facility], [address], [place], [place] [address]    Report Status PENDING  Incomplete     Scheduled Meds:  aspirin EC  81 mg Oral Daily   Chlorhexidine Gluconate Cloth  6 each Topical Daily   Chlorhexidine Gluconate Cloth  6 each Topical Q0600   cinacalcet  30 mg Oral Q breakfast   heparin injection (subcutaneous)  5,000 Units Subcutaneous Q8H   hydrALAZINE  5 mg Intravenous Once   insulin aspart  0-5 Units Subcutaneous QHS   insulin aspart  0-6 Units Subcutaneous TID WC   insulin glargine-yfgn  4 Units Subcutaneous QHS   labetalol  100 mg Oral BID   loratadine  10 mg Oral Daily   montelukast  10 mg Oral QHS   NIFEdipine  30 mg Oral Once   pantoprazole  40 mg Oral Daily   rOPINIRole  2 mg Oral QHS   rosuvastatin  20 mg Oral Daily   sevelamer carbonate  1,600 mg Oral BID WC   sevelamer carbonate  2,400 mg Oral Q lunch   Continuous Infusions:  anticoagulant sodium  citrate      ceFAZolin (ANCEF) IV Stopped (09/17/23 2028)    Procedures/Studies: ECHO TEE  Result Date: 09/18/2023    TRANSESOPHOGEAL ECHO REPORT   Patient Name:   [facility] Date of Exam: 09/18/2023 Medical Rec #:  [id number]   Height:       63.0 in Accession #:    [id number]  Weight:       247.1 lb Date of Birth:  [birth year]  BSA:          2.116 m Patient Age:    51 years    BP:           165/81 mmHg Patient Gender: F           HR:           91 bpm. [facility] Procedure: Transesophageal Echo, Cardiac Doppler and Color Doppler Indications:    Bacteremia R78.81  History:        Patient has prior history of Echocardiogram examinations, most                 recent 09/15/2023. CHF; Risk Factors:Hypertension, Diabetes and                 Dyslipidemia. ESRD on dialysis [facility]), Breast cancer (HCC), OSA                 (  w Reflex to ID Panel     Status: None (Preliminary result)   Collection Time: 09/16/23  6:46 PM   Specimen: BLOOD  Result Value Ref Range Status   Specimen Description BLOOD BLOOD RIGHT HAND  Final   Special Requests   Final    BOTTLES DRAWN AEROBIC ONLY Blood Culture results may not be optimal due to an inadequate volume of blood received in culture bottles   Culture   Final    NO GROWTH 2 DAYS Performed at [facility], [address], [place], [place] [address]    Report Status PENDING  Incomplete  Culture, blood (Routine X 2) w Reflex to ID Panel     Status: None (Preliminary result)   Collection Time: 09/16/23  6:51 PM   Specimen: BLOOD  Result Value Ref Range Status   Specimen Description BLOOD BLOOD RIGHT WRIST  Final   Special Requests   Final    BOTTLES DRAWN AEROBIC AND ANAEROBIC Blood Culture adequate volume   Culture   Final    NO GROWTH 2 DAYS Performed at [facility], [address], [place], [place] [address]    Report Status PENDING  Incomplete     Scheduled Meds:  aspirin EC  81 mg Oral Daily   Chlorhexidine Gluconate Cloth  6 each Topical Daily   Chlorhexidine Gluconate Cloth  6 each Topical Q0600   cinacalcet  30 mg Oral Q breakfast   heparin injection (subcutaneous)  5,000 Units Subcutaneous Q8H   hydrALAZINE  5 mg Intravenous Once   insulin aspart  0-5 Units Subcutaneous QHS   insulin aspart  0-6 Units Subcutaneous TID WC   insulin glargine-yfgn  4 Units Subcutaneous QHS   labetalol  100 mg Oral BID   loratadine  10 mg Oral Daily   montelukast  10 mg Oral QHS   NIFEdipine  30 mg Oral Once   pantoprazole  40 mg Oral Daily   rOPINIRole  2 mg Oral QHS   rosuvastatin  20 mg Oral Daily   sevelamer carbonate  1,600 mg Oral BID WC   sevelamer carbonate  2,400 mg Oral Q lunch   Continuous Infusions:  anticoagulant sodium  citrate      ceFAZolin (ANCEF) IV Stopped (09/17/23 2028)    Procedures/Studies: ECHO TEE  Result Date: 09/18/2023    TRANSESOPHOGEAL ECHO REPORT   Patient Name:   [facility] Date of Exam: 09/18/2023 Medical Rec #:  [id number]   Height:       63.0 in Accession #:    [id number]  Weight:       247.1 lb Date of Birth:  [birth year]  BSA:          2.116 m Patient Age:    51 years    BP:           165/81 mmHg Patient Gender: F           HR:           91 bpm. [facility] Procedure: Transesophageal Echo, Cardiac Doppler and Color Doppler Indications:    Bacteremia R78.81  History:        Patient has prior history of Echocardiogram examinations, most                 recent 09/15/2023. CHF; Risk Factors:Hypertension, Diabetes and                 Dyslipidemia. ESRD on dialysis [facility]), Breast cancer (HCC), OSA                 (  Name:   Patricia [name] Date of Exam: 09/15/2023 Medical Rec #:  [id number]   Height:       63.0 in Accession #:    [id number]  Weight:       245.0 lb Date of Birth:  1971/10/02  BSA:          2.108 m Patient Age:    51 years    BP:           125132/4757 mmHg Patient Gender: F           HR:           93 bpm. [facility] Procedure: 2D Echo, Cardiac Doppler and Color Doppler Indications:    Fever R50.9  History:        Patient has prior history of Echocardiogram examinations, most                 recent 11/19/2022. CHF; Risk Factors:Hypertension, Diabetes and                 Dyslipidemia. ESRD on dialysis [facility]), Breast cancer (HCC), OSA                 (obstructive sleep apnea), Covid-19.  Sonographer:    [name] RCS Referring Phys: [id number] MAURICIO DANIEL ARRIEN IMPRESSIONS  1. Left ventricular ejection fraction, by estimation, is 60 to 65%. The left ventricle has normal function. The left ventricle has no regional  wall motion abnormalities. There is mild concentric left ventricular hypertrophy. Left ventricular diastolic parameters are consistent with Grade I diastolic dysfunction (impaired relaxation).  2. Right ventricular systolic function is normal. The right ventricular size is normal. Tricuspid regurgitation signal is inadequate for assessing PA pressure.  3. No evidence of mitral valve regurgitation.  4. The aortic valve is grossly normal. Aortic valve regurgitation is not visualized. Mild aortic valve stenosis.  5. The inferior vena cava is normal in size with greater than 50% respiratory variability, suggesting right atrial pressure of 3 mmHg. Comparison(s): No significant change from prior study. Conclusion(s)/Recommendation(s): No evidence of valvular vegetations on this transthoracic echocardiogram. Consider a transesophageal echocardiogram to exclude infective endocarditis if clinically indicated. FINDINGS  Left Ventricle: Left ventricular ejection fraction, by estimation, is 60 to 65%. The left ventricle has normal function. The left ventricle has no regional wall motion abnormalities. The left ventricular internal cavity size was normal in size. There is  mild concentric left ventricular hypertrophy. Left ventricular diastolic parameters are consistent with Grade I diastolic dysfunction (impaired relaxation). Right Ventricle: The right ventricular size is normal. Right ventricular systolic function is normal. Tricuspid regurgitation signal is inadequate for assessing PA pressure. Left Atrium: Left atrial size was normal in size. Right Atrium: Right atrial size was normal in size. Pericardium: There is no evidence of pericardial effusion. Mitral Valve: Mild mitral annular calcification. No evidence of mitral valve regurgitation. Tricuspid Valve: Tricuspid valve regurgitation is not demonstrated. Aortic Valve: The aortic valve is grossly normal. Aortic valve regurgitation is not visualized. Mild aortic stenosis  is present. Aortic valve mean gradient measures 10.3 mmHg. Aortic valve peak gradient measures 21.8 mmHg. Aortic valve area, by VTI measures 1.93 cm. Pulmonic Valve: Pulmonic valve regurgitation is not visualized. Aorta: The aortic root is normal in size and structure. Venous: The inferior vena cava is normal in size with greater than 50% respiratory variability, suggesting right atrial pressure of 3 mmHg. IAS/Shunts: The interatrial septum was not well visualized.  LEFT VENTRICLE PLAX 2D LVIDd:  w Reflex to ID Panel     Status: None (Preliminary result)   Collection Time: 09/16/23  6:46 PM   Specimen: BLOOD  Result Value Ref Range Status   Specimen Description BLOOD BLOOD RIGHT HAND  Final   Special Requests   Final    BOTTLES DRAWN AEROBIC ONLY Blood Culture results may not be optimal due to an inadequate volume of blood received in culture bottles   Culture   Final    NO GROWTH 2 DAYS Performed at [facility], [address], [place], [place] [address]    Report Status PENDING  Incomplete  Culture, blood (Routine X 2) w Reflex to ID Panel     Status: None (Preliminary result)   Collection Time: 09/16/23  6:51 PM   Specimen: BLOOD  Result Value Ref Range Status   Specimen Description BLOOD BLOOD RIGHT WRIST  Final   Special Requests   Final    BOTTLES DRAWN AEROBIC AND ANAEROBIC Blood Culture adequate volume   Culture   Final    NO GROWTH 2 DAYS Performed at [facility], [address], [place], [place] [address]    Report Status PENDING  Incomplete     Scheduled Meds:  aspirin EC  81 mg Oral Daily   Chlorhexidine Gluconate Cloth  6 each Topical Daily   Chlorhexidine Gluconate Cloth  6 each Topical Q0600   cinacalcet  30 mg Oral Q breakfast   heparin injection (subcutaneous)  5,000 Units Subcutaneous Q8H   hydrALAZINE  5 mg Intravenous Once   insulin aspart  0-5 Units Subcutaneous QHS   insulin aspart  0-6 Units Subcutaneous TID WC   insulin glargine-yfgn  4 Units Subcutaneous QHS   labetalol  100 mg Oral BID   loratadine  10 mg Oral Daily   montelukast  10 mg Oral QHS   NIFEdipine  30 mg Oral Once   pantoprazole  40 mg Oral Daily   rOPINIRole  2 mg Oral QHS   rosuvastatin  20 mg Oral Daily   sevelamer carbonate  1,600 mg Oral BID WC   sevelamer carbonate  2,400 mg Oral Q lunch   Continuous Infusions:  anticoagulant sodium  citrate      ceFAZolin (ANCEF) IV Stopped (09/17/23 2028)    Procedures/Studies: ECHO TEE  Result Date: 09/18/2023    TRANSESOPHOGEAL ECHO REPORT   Patient Name:   [facility] Date of Exam: 09/18/2023 Medical Rec #:  [id number]   Height:       63.0 in Accession #:    [id number]  Weight:       247.1 lb Date of Birth:  [birth year]  BSA:          2.116 m Patient Age:    51 years    BP:           165/81 mmHg Patient Gender: F           HR:           91 bpm. [facility] Procedure: Transesophageal Echo, Cardiac Doppler and Color Doppler Indications:    Bacteremia R78.81  History:        Patient has prior history of Echocardiogram examinations, most                 recent 09/15/2023. CHF; Risk Factors:Hypertension, Diabetes and                 Dyslipidemia. ESRD on dialysis [facility]), Breast cancer (HCC), OSA                 (  w Reflex to ID Panel     Status: None (Preliminary result)   Collection Time: 09/16/23  6:46 PM   Specimen: BLOOD  Result Value Ref Range Status   Specimen Description BLOOD BLOOD RIGHT HAND  Final   Special Requests   Final    BOTTLES DRAWN AEROBIC ONLY Blood Culture results may not be optimal due to an inadequate volume of blood received in culture bottles   Culture   Final    NO GROWTH 2 DAYS Performed at [facility], [address], [place], [place] [address]    Report Status PENDING  Incomplete  Culture, blood (Routine X 2) w Reflex to ID Panel     Status: None (Preliminary result)   Collection Time: 09/16/23  6:51 PM   Specimen: BLOOD  Result Value Ref Range Status   Specimen Description BLOOD BLOOD RIGHT WRIST  Final   Special Requests   Final    BOTTLES DRAWN AEROBIC AND ANAEROBIC Blood Culture adequate volume   Culture   Final    NO GROWTH 2 DAYS Performed at [facility], [address], [place], [place] [address]    Report Status PENDING  Incomplete     Scheduled Meds:  aspirin EC  81 mg Oral Daily   Chlorhexidine Gluconate Cloth  6 each Topical Daily   Chlorhexidine Gluconate Cloth  6 each Topical Q0600   cinacalcet  30 mg Oral Q breakfast   heparin injection (subcutaneous)  5,000 Units Subcutaneous Q8H   hydrALAZINE  5 mg Intravenous Once   insulin aspart  0-5 Units Subcutaneous QHS   insulin aspart  0-6 Units Subcutaneous TID WC   insulin glargine-yfgn  4 Units Subcutaneous QHS   labetalol  100 mg Oral BID   loratadine  10 mg Oral Daily   montelukast  10 mg Oral QHS   NIFEdipine  30 mg Oral Once   pantoprazole  40 mg Oral Daily   rOPINIRole  2 mg Oral QHS   rosuvastatin  20 mg Oral Daily   sevelamer carbonate  1,600 mg Oral BID WC   sevelamer carbonate  2,400 mg Oral Q lunch   Continuous Infusions:  anticoagulant sodium  citrate      ceFAZolin (ANCEF) IV Stopped (09/17/23 2028)    Procedures/Studies: ECHO TEE  Result Date: 09/18/2023    TRANSESOPHOGEAL ECHO REPORT   Patient Name:   [facility] Date of Exam: 09/18/2023 Medical Rec #:  [id number]   Height:       63.0 in Accession #:    [id number]  Weight:       247.1 lb Date of Birth:  [birth year]  BSA:          2.116 m Patient Age:    51 years    BP:           165/81 mmHg Patient Gender: F           HR:           91 bpm. [facility] Procedure: Transesophageal Echo, Cardiac Doppler and Color Doppler Indications:    Bacteremia R78.81  History:        Patient has prior history of Echocardiogram examinations, most                 recent 09/15/2023. CHF; Risk Factors:Hypertension, Diabetes and                 Dyslipidemia. ESRD on dialysis [facility]), Breast cancer (HCC), OSA                 (  w Reflex to ID Panel     Status: None (Preliminary result)   Collection Time: 09/16/23  6:46 PM   Specimen: BLOOD  Result Value Ref Range Status   Specimen Description BLOOD BLOOD RIGHT HAND  Final   Special Requests   Final    BOTTLES DRAWN AEROBIC ONLY Blood Culture results may not be optimal due to an inadequate volume of blood received in culture bottles   Culture   Final    NO GROWTH 2 DAYS Performed at [facility], [address], [place], [place] [address]    Report Status PENDING  Incomplete  Culture, blood (Routine X 2) w Reflex to ID Panel     Status: None (Preliminary result)   Collection Time: 09/16/23  6:51 PM   Specimen: BLOOD  Result Value Ref Range Status   Specimen Description BLOOD BLOOD RIGHT WRIST  Final   Special Requests   Final    BOTTLES DRAWN AEROBIC AND ANAEROBIC Blood Culture adequate volume   Culture   Final    NO GROWTH 2 DAYS Performed at [facility], [address], [place], [place] [address]    Report Status PENDING  Incomplete     Scheduled Meds:  aspirin EC  81 mg Oral Daily   Chlorhexidine Gluconate Cloth  6 each Topical Daily   Chlorhexidine Gluconate Cloth  6 each Topical Q0600   cinacalcet  30 mg Oral Q breakfast   heparin injection (subcutaneous)  5,000 Units Subcutaneous Q8H   hydrALAZINE  5 mg Intravenous Once   insulin aspart  0-5 Units Subcutaneous QHS   insulin aspart  0-6 Units Subcutaneous TID WC   insulin glargine-yfgn  4 Units Subcutaneous QHS   labetalol  100 mg Oral BID   loratadine  10 mg Oral Daily   montelukast  10 mg Oral QHS   NIFEdipine  30 mg Oral Once   pantoprazole  40 mg Oral Daily   rOPINIRole  2 mg Oral QHS   rosuvastatin  20 mg Oral Daily   sevelamer carbonate  1,600 mg Oral BID WC   sevelamer carbonate  2,400 mg Oral Q lunch   Continuous Infusions:  anticoagulant sodium  citrate      ceFAZolin (ANCEF) IV Stopped (09/17/23 2028)    Procedures/Studies: ECHO TEE  Result Date: 09/18/2023    TRANSESOPHOGEAL ECHO REPORT   Patient Name:   [facility] Date of Exam: 09/18/2023 Medical Rec #:  [id number]   Height:       63.0 in Accession #:    [id number]  Weight:       247.1 lb Date of Birth:  [birth year]  BSA:          2.116 m Patient Age:    51 years    BP:           165/81 mmHg Patient Gender: F           HR:           91 bpm. [facility] Procedure: Transesophageal Echo, Cardiac Doppler and Color Doppler Indications:    Bacteremia R78.81  History:        Patient has prior history of Echocardiogram examinations, most                 recent 09/15/2023. CHF; Risk Factors:Hypertension, Diabetes and                 Dyslipidemia. ESRD on dialysis [facility]), Breast cancer (HCC), OSA                 (

## 2023-09-19 DIAGNOSIS — Z992 Dependence on renal dialysis: Secondary | ICD-10-CM | POA: Diagnosis not present

## 2023-09-19 DIAGNOSIS — B9561 Methicillin susceptible Staphylococcus aureus infection as the cause of diseases classified elsewhere: Secondary | ICD-10-CM | POA: Diagnosis not present

## 2023-09-19 DIAGNOSIS — N186 End stage renal disease: Secondary | ICD-10-CM | POA: Diagnosis not present

## 2023-09-19 DIAGNOSIS — R6520 Severe sepsis without septic shock: Secondary | ICD-10-CM | POA: Diagnosis not present

## 2023-09-19 DIAGNOSIS — A419 Sepsis, unspecified organism: Secondary | ICD-10-CM | POA: Diagnosis not present

## 2023-09-19 DIAGNOSIS — R7881 Bacteremia: Secondary | ICD-10-CM | POA: Diagnosis not present

## 2023-09-19 LAB — CBC
HCT: 27.3 % — ABNORMAL LOW (ref 36.0–46.0)
Hemoglobin: 8.3 g/dL — ABNORMAL LOW (ref 12.0–15.0)
MCH: 27.7 pg (ref 26.0–34.0)
MCHC: 30.4 g/dL (ref 30.0–36.0)
MCV: 91 fL (ref 80.0–100.0)
Platelets: 240 10*3/uL (ref 150–400)
RBC: 3 MIL/uL — ABNORMAL LOW (ref 3.87–5.11)
RDW: 14.8 % (ref 11.5–15.5)
WBC: 15.9 10*3/uL — ABNORMAL HIGH (ref 4.0–10.5)
nRBC: 0 % (ref 0.0–0.2)

## 2023-09-19 LAB — GLUCOSE, CAPILLARY
Glucose-Capillary: 205 mg/dL — ABNORMAL HIGH (ref 70–99)
Glucose-Capillary: 192 mg/dL — ABNORMAL HIGH (ref 70–99)
Glucose-Capillary: 316 mg/dL — ABNORMAL HIGH (ref 70–99)
Glucose-Capillary: 185 mg/dL — ABNORMAL HIGH (ref 70–99)

## 2023-09-19 LAB — RENAL FUNCTION PANEL
Albumin: 3.2 g/dL — ABNORMAL LOW (ref 3.5–5.0)
Anion gap: 22 — ABNORMAL HIGH (ref 5–15)
BUN: 98 mg/dL — ABNORMAL HIGH (ref 6–20)
CO2: 22 mmol/L (ref 22–32)
Calcium: 9.6 mg/dL (ref 8.9–10.3)
Chloride: 88 mmol/L — ABNORMAL LOW (ref 98–111)
Creatinine, Ser: 15.6 mg/dL — ABNORMAL HIGH (ref 0.44–1.00)
GFR, Estimated: 2 mL/min — ABNORMAL LOW (ref >60)
Glucose, Bld: 367 mg/dL — ABNORMAL HIGH (ref 70–99)
Phosphorus: 6.5 mg/dL — ABNORMAL HIGH (ref 2.5–4.6)
Potassium: 3.9 mmol/L (ref 3.5–5.1)
Sodium: 132 mmol/L — ABNORMAL LOW (ref 135–145)

## 2023-09-19 MED ORDER — PROCHLORPERAZINE EDISYLATE 10 MG/2ML IJ SOLN
10.0000 mg | Freq: Once | INTRAMUSCULAR | Status: AC
  Administered 2023-09-19: 10 mg via INTRAVENOUS
  Filled 2023-09-19: qty 2

## 2023-09-19 MED ORDER — DICLOFENAC SODIUM 1 % EX GEL
4.0000 g | Freq: Four times a day (QID) | CUTANEOUS | Status: DC
  Administered 2023-09-19 – 2023-09-23 (×21): 4 g via TOPICAL
  Filled 2023-09-19 (×3): qty 100

## 2023-09-19 MED ORDER — PANTOPRAZOLE SODIUM 40 MG IV SOLR
40.0000 mg | Freq: Two times a day (BID) | INTRAVENOUS | Status: DC
  Administered 2023-09-19 – 2023-09-23 (×9): 40 mg via INTRAVENOUS
  Filled 2023-09-19 (×10): qty 10

## 2023-09-19 MED ORDER — HEPARIN SODIUM (PORCINE) 1000 UNIT/ML DIALYSIS
1000.0000 [IU] | INTRAMUSCULAR | Status: DC | PRN
  Administered 2023-09-19: 2800 [IU]
  Filled 2023-09-19: qty 1

## 2023-09-19 MED ORDER — HEPARIN SODIUM (PORCINE) 1000 UNIT/ML DIALYSIS
20.0000 [IU]/kg | INTRAMUSCULAR | Status: DC | PRN
  Administered 2023-09-24: 2200 [IU] via INTRAVENOUS_CENTRAL
  Filled 2023-09-19: qty 3

## 2023-09-19 NOTE — Inpatient Diabetes Management (Signed)
Inpatient Diabetes Program Recommendations  AACE/ADA: New Consensus Statement on Inpatient Glycemic Control  Target Ranges:  Prepandial:   less than 140 mg/dL      Peak postprandial:   less than 180 mg/dL (1-2 hours)      Critically ill patients:  140 - 180 mg/dL    Latest Reference Range & Units 09/18/23 07:48 09/18/23 10:19 09/18/23 12:37 09/18/23 16:27 09/18/23 19:50 09/19/23 07:36  Glucose-Capillary 70 - 99 mg/dL 161 (H) 096 (H) 045 (H) 220 (H) 263 (H) 205 (H)   Review of Glycemic Control  Diabetes history: DM2 Outpatient Diabetes medications: Glipizide 5 mg QAM, Tresiba 20 units daily Current orders for Inpatient glycemic control: Semglee 4 units at bedtime, Novolog 0-6 units TID with meals, Novolog 0-5 units QHS   Inpatient Diabetes Program Recommendations:     Insulin: If CBGs remain consistently over 180 mg/dl please consider increasing Semglee to 8 units at bedtime.   Thanks, Orlando Penner, RN, MSN, CDCES Diabetes Coordinator Inpatient Diabetes Program [phone] (Team Pager from 8am to 5pm)

## 2023-09-19 NOTE — Progress Notes (Signed)
Went to place patient on CPAP. She is sleepy on and off but currently awake and nauseous. States she just had an emesis episode. Will hold CPAP for now since she is actively nauseous and throwing up. Patient agreeable to this plan.

## 2023-09-19 NOTE — Progress Notes (Deleted)
This am patient very agitated upon arrival for shift. Pt call bell unplugged at the time of assessment and patient screaming at this RN stating "no one cares about me{" Patient stating she has pain all over, is vomiting constantly and that no one will listen to her. When this RN asked patient to not yell, patient became increasingly agitated and stated " I have pain" This RN then messaged care team relaying patients frustration and complaints. Treated pain with IV prn pain medication and stated that I would return with antiemetic with her morning meds. Patient then complained she was dirty and no one offered to clean her up. This RN completed CHG bath and changed gown. Patient more calm at this time.

## 2023-09-19 NOTE — Progress Notes (Signed)
This am patient very agitated upon arrival for shift. Pt call bell unplugged at the time of assessment and patient screaming at this RN stating "no one cares about me{" Patient stating she has pain all over, is vomiting constantly and that no one will listen to her. When this RN asked patient to not yell, patient became increasingly agitated and stated " I have pain" This RN then messaged care team relaying patients frustration and complaints. Treated pain with IV prn pain medication and stated that I would return with antiemetic with her morning meds. Patient then complained she was dirty and no one offered to clean her up. This RN completed CHG bath and changed gown. Patient more calm at this time.

## 2023-09-19 NOTE — Progress Notes (Signed)
Virtual Visit via Telephone/Video Note   I connected with Patricia [name]   On 09/19/2023 at 11:14 AM  by Video and verified that I am speaking with the correct person using two identifiers.   I discussed the limitations, risks, security and privacy concerns of performing an evaluation and management service by video. Location:   Patient:AP Provider: [facility] for Infectious Disease  Date of Admission:  09/14/2023   Total days of inpatient antibiotics 4  Principal Problem:   Severe sepsis (HCC) Active Problems:   ESRD on dialysis [facility])   Essential hypertension   GERD (gastroesophageal reflux disease)   Class 3 obesity   Type 2 diabetes mellitus with hyperlipidemia (HCC)   Bacteremia   Staphylococcus aureus bacteremia          Assessment: 52 year old female with past medical history of ESRD on HD, obesity, diabetes mellitus, hypertension and asthma admitted with:  #MSSA bacteremia likely secondary to HD catheter - She underwent loopogram on 8/20//24 with vascular surgery for malfunctioning left Lacroce per extremity fistula.  Follow-up with [facility] on 9/17, had been getting HD through tunneled catheter left IJ.  She had low-grade fever with dialysis on 9/25.  Initially her left upper extremity fistula was used, due to discomfort HD catheter was used to complete treatment.  She felt better after acetaminophen.  Then developed sent home,, pain at left shoulder.To ED to had a temp of 103, leukocytosis.  Blood cultures grew MSSA. - Started on cefazolin and HD catheter was pulled. Recommendations: -Right knee pain worsening since Wednesday, right knee x-ray showed no acute fracture/dislocation, high-grade atherosclerotic calcification, No joint effusion.  Seen by Ortho on 10/2, no effusion noted on exam, knee was not red or warm to touch noted no low likelihood of septic joint recommend against aspiration.  Will get MRI right knee for further characterization given MSSA  bacteremia and concern for septic emboli - Line holiday x 72 hours, HD catheter was removed on 9/30 at 1900.  Repeat blood cultures as cultures taken prior to line removal at 1846, 1851 - Continue cefazolin   Microbiology:   Antibiotics: Cefazolin 9/29-present Vancomycin 9/28 Cultures: Blood 9/28 2/2 MSSA 9/30 at 1846, 1851 no growth    SUBJECTIVE: Resting in bed, reports knee pain Interval: Afebrile overnight, WBC 15K  Review of Systems: Review of Systems  All other systems reviewed and are negative.    Scheduled Meds:  aspirin EC  81 mg Oral Daily   Chlorhexidine Gluconate Cloth  6 each Topical Daily   Chlorhexidine Gluconate Cloth  6 each Topical Q0600   cinacalcet  30 mg Oral Q breakfast   diclofenac Sodium  4 g Topical QID   heparin injection (subcutaneous)  5,000 Units Subcutaneous Q8H   insulin aspart  0-5 Units Subcutaneous QHS   insulin aspart  0-6 Units Subcutaneous TID WC   insulin glargine-yfgn  4 Units Subcutaneous QHS   labetalol  100 mg Oral BID   loratadine  10 mg Oral Daily   montelukast  10 mg Oral QHS   pantoprazole (PROTONIX) IV  40 mg Intravenous Q12H   rOPINIRole  2 mg Oral QHS   rosuvastatin  20 mg Oral Daily   sevelamer carbonate  1,600 mg Oral BID WC   sevelamer carbonate  2,400 mg Oral Q lunch   Continuous Infusions:  anticoagulant sodium citrate      ceFAZolin (ANCEF) IV Stopped (09/17/23 2028)   PRN Meds:.acetaminophen **OR** acetaminophen,  albuterol, ALPRAZolam, alteplase, anticoagulant sodium citrate, heparin, HYDROmorphone (DILAUDID) injection, lidocaine (PF), lidocaine-prilocaine, methocarbamol, ondansetron **OR** ondansetron (ZOFRAN) IV, pentafluoroprop-tetrafluoroeth, sodium chloride Allergies  Allergen Reactions   Norvasc [Amlodipine Besylate] Rash and Other (See Comments)    Dizziness    Reglan [Metoclopramide] Other (See Comments)    Hallucinations     OBJECTIVE: Vitals:   09/19/23 0525 09/19/23 0600 09/19/23 0822  09/19/23 0900  BP: (!) 169/76 (!) 151/59  (!) 148/70  Pulse: 85 86 87 84  Resp: (!) 26 (!) 27 (!) 25 (!) 21  Temp:   97.9 F (36.6 C)   TempSrc:   Oral   SpO2: 92% 94% 92% 93%  Weight:      Height:       Body mass index is 43.78 kg/m.      Lab Results Lab Results  Component Value Date   WBC 15.0 (H) 09/18/2023   HGB 8.7 (L) 09/18/2023   HCT 28.1 (L) 09/18/2023   MCV 90.6 09/18/2023   PLT 231 09/18/2023    Lab Results  Component Value Date   CREATININE 11.94 (H) 09/18/2023   BUN 79 (H) 09/18/2023   NA 131 (L) 09/18/2023   K 4.1 09/18/2023   CL 90 (L) 09/18/2023   CO2 21 (L) 09/18/2023    Lab Results  Component Value Date   ALT 13 09/14/2023   AST 17 09/14/2023   ALKPHOS 88 09/14/2023   BILITOT 0.4 09/14/2023        Danelle Earthly, MD [facility] 09/19/2023, 10:43 AM   I have personally spent 52 minutes involved in face-to-face and non-face-to-face activities for this patient on the day of the visit. Professional time spent includes the following activities: Preparing to see the patient (review of tests), Obtaining and/or reviewing separately obtained history (admission/discharge record), Performing a medically appropriate examination and/or evaluation , Ordering medications/tests/procedures, referring and communicating with other health care professionals, Documenting clinical information in the EMR, Independently interpreting results (not separately reported), Communicating results to the patient/family/caregiver, Counseling and educating the patient/family/caregiver and Care coordination (not separately reported).

## 2023-09-19 NOTE — Progress Notes (Signed)
  HEMODIALYSIS TREATMENT NOTE:  Uneventful 4 hour low-heparin dialysis treatment completed using left femoral non-tunneled catheter.  Goal was lowered once in response to declining BP, then UF was interrupted last 15 minutes of treatment d/t cramping.  Goal met / net UF 3.1 liters. Ancef 1g given during last 30 minutes of tx.  All blood was returned.  Post-treatment:  09/19/23 2100  Vitals  Temp 98.6 F (37 C)  Temp Source Oral  BP (!) 139/58  MAP (mmHg) 84  BP Location Right Wrist  BP Method Automatic  Patient Position (if appropriate) Sitting  Pulse Rate 89  Pulse Rate Source Monitor  ECG Heart Rate 90  Resp (!) 22  Oxygen Therapy  SpO2 96 %  O2 Device Nasal Cannula  O2 Flow Rate (L/min) 4 L/min  Post Treatment  Dialyzer Clearance Lightly streaked  Hemodialysis Intake (mL) 0 mL  Liters Processed 77.1  Fluid Removed (mL) 3100 mL  Tolerated HD Treatment Yes  Post-Hemodialysis Comments Goal met  Hemodialysis Catheter Left Femoral vein Triple lumen Temporary (Non-Tunneled)  Placement Date/Time: 09/19/23 1200   Placed prior to admission: No  Time Out: Correct site;Correct patient;Correct procedure  Maximum sterile barrier precautions: Hand hygiene;Sterile gown;Sterile probe cover;Cap;Sterile gloves;Mask;Large sterile shee...  Blue Lumen Status Flushed;Heparin locked;Dead end cap in place  Red Lumen Status Flushed;Heparin locked;Dead end cap in place  Catheter fill solution Heparin 1000 units/ml  Catheter fill volume (Arterial) 1.4 cc  Catheter fill volume (Venous) 1.4  Dressing Type Transparent;Tube stabilization device  Dressing Status Antimicrobial disc in place;Clean, Dry, Intact  Interventions New dressing  Drainage Description None  Post treatment catheter status Capped and Clamped    Arman Filter, RN [facility] ICU4

## 2023-09-19 NOTE — Progress Notes (Signed)
PROGRESS NOTE  Patricia [name] WUJ:[id number] DOB: 1970/12/18 DOA: 09/14/2023 PCP: [name], PA-C  Brief History:  As per H&P written by Dr. [name] on 09/14/2023 Patricia [name] is a 52 y.o. female with medical history significant of ESRD on hemodialysis, (M,W,F), obesity class 3, T2DM, hypertension and asthma who presented with dyspnea, fevers and left neck pain.    Patient has a left upper extremity fistula that has been malfunctioning, she underwent Fistulogram on 08.27.24 by Dr [name] and then had a follow up with with vascular surgery in [facility] 09/03/23, plan for further follow up with vascular ultrasound.  In the meanwhile she has been getting HD through a tunneled catheter in her left internal jugular vein.    Patient was noted to have a low grade fever on Wednesday, 09/25 during dialysis. She was treated with acetaminophen and completed HD. During that session, initially her left upper extremity fistula was used but she developed pain and discomfort, not able to complete HD by this route, then completed her treatment through her HD catheter.    The following day she felt a little better, but still not back to baseline, she continue having chills and generalized malaise.  Yesterday she had several episodes of vomiting prior to HD. She was medicated with ondansetron, acetaminophen and lomotil. She was able to complete her HD treatment through her HD catheter.    At home she continue to have chills and now developed pain in her left shoulder area, radiating into her head and left ear. No improving factors.  Assessment/Plan: * Severe sepsis secondary to MSSA bacteremia (HCC) -Severe sepsis with elevated leukocytosis, lactic acid, present on admission.  -Continue IV antibiotics--Ancef -Continue supportive care and follow clinical response -Dialysis catheter has been removed 9/30 -10/2 TEE--no vegetations -TTE now demonstrating vegetations. -Stable vital signs. -9/30 repeat  blood cultures>>neg to date   Acute on chronic hypoxemic and hypercapnic respiratory failure. (Sepsis related).  -Continue supplemental 02  -chronically on 2L at home -currently on 4L -Continue CPAP nightly. -9/30 CXR - vascular congestion   ESRD on dialysis [facility]) -Patient chronically Monday, Wednesday and Friday for dialysis purposes -Last treatment on 09/16/2023 -Nephrology service consulted and will follow recommendations. -Patient's hemodialysis catheter successfully removed after dialysis treatment on 09/16/2023. -10/3--temporary HD catheter placed by gen surg--appreciate Dr. [name]   Right Knee pain -xray right knee--neg effusion, neg fx -consulted ortho--advised against arthrocentesis, no effusion noted on exam, knee was not red or warm to touch noted no low likelihood of septic joint  -MR knee ordered per ID   Essential hypertension -Currently stable -Follow blood pressure and adjust antihypertensive regimen as needed. -restarted normodyne, nifedipine   GERD (gastroesophageal reflux disease) -Continue with PPI  -Lifestyle changes discussed with patient.   Type 2 diabetes mellitus with hyperlipidemia (HCC) -Continue sliding scale insulin and follow CBGs fluctuation with further adjustment to hypoglycemic regimen as required.   Class 3 obesity (HCC) -Body mass index is 43.78 kg/m. -Low-calorie diet, portion control and increase physical activity discussed with patient.   OSA -Continue CPAP nightly. -Patient instructed to be compliant. -Alert and following commands appropriately; no somnolence appreciated.         Family Communication:   son at bedside updated 10/3   Consultants:  renal, general surgery, ID   Code Status:  FULL    DVT Prophylaxis:  [place] Heparin      Procedures: As Listed in Progress Note Above   Antibiotics:  PROGRESS NOTE  Patricia [name] WUJ:[id number] DOB: 1970/12/18 DOA: 09/14/2023 PCP: [name], PA-C  Brief History:  As per H&P written by Dr. [name] on 09/14/2023 Patricia [name] is a 52 y.o. female with medical history significant of ESRD on hemodialysis, (M,W,F), obesity class 3, T2DM, hypertension and asthma who presented with dyspnea, fevers and left neck pain.    Patient has a left upper extremity fistula that has been malfunctioning, she underwent Fistulogram on 08.27.24 by Dr [name] and then had a follow up with with vascular surgery in [facility] 09/03/23, plan for further follow up with vascular ultrasound.  In the meanwhile she has been getting HD through a tunneled catheter in her left internal jugular vein.    Patient was noted to have a low grade fever on Wednesday, 09/25 during dialysis. She was treated with acetaminophen and completed HD. During that session, initially her left upper extremity fistula was used but she developed pain and discomfort, not able to complete HD by this route, then completed her treatment through her HD catheter.    The following day she felt a little better, but still not back to baseline, she continue having chills and generalized malaise.  Yesterday she had several episodes of vomiting prior to HD. She was medicated with ondansetron, acetaminophen and lomotil. She was able to complete her HD treatment through her HD catheter.    At home she continue to have chills and now developed pain in her left shoulder area, radiating into her head and left ear. No improving factors.  Assessment/Plan: * Severe sepsis secondary to MSSA bacteremia (HCC) -Severe sepsis with elevated leukocytosis, lactic acid, present on admission.  -Continue IV antibiotics--Ancef -Continue supportive care and follow clinical response -Dialysis catheter has been removed 9/30 -10/2 TEE--no vegetations -TTE now demonstrating vegetations. -Stable vital signs. -9/30 repeat  blood cultures>>neg to date   Acute on chronic hypoxemic and hypercapnic respiratory failure. (Sepsis related).  -Continue supplemental 02  -chronically on 2L at home -currently on 4L -Continue CPAP nightly. -9/30 CXR - vascular congestion   ESRD on dialysis [facility]) -Patient chronically Monday, Wednesday and Friday for dialysis purposes -Last treatment on 09/16/2023 -Nephrology service consulted and will follow recommendations. -Patient's hemodialysis catheter successfully removed after dialysis treatment on 09/16/2023. -10/3--temporary HD catheter placed by gen surg--appreciate Dr. [name]   Right Knee pain -xray right knee--neg effusion, neg fx -consulted ortho--advised against arthrocentesis, no effusion noted on exam, knee was not red or warm to touch noted no low likelihood of septic joint  -MR knee ordered per ID   Essential hypertension -Currently stable -Follow blood pressure and adjust antihypertensive regimen as needed. -restarted normodyne, nifedipine   GERD (gastroesophageal reflux disease) -Continue with PPI  -Lifestyle changes discussed with patient.   Type 2 diabetes mellitus with hyperlipidemia (HCC) -Continue sliding scale insulin and follow CBGs fluctuation with further adjustment to hypoglycemic regimen as required.   Class 3 obesity (HCC) -Body mass index is 43.78 kg/m. -Low-calorie diet, portion control and increase physical activity discussed with patient.   OSA -Continue CPAP nightly. -Patient instructed to be compliant. -Alert and following commands appropriately; no somnolence appreciated.         Family Communication:   son at bedside updated 10/3   Consultants:  renal, general surgery, ID   Code Status:  FULL    DVT Prophylaxis:  [place] Heparin      Procedures: As Listed in Progress Note Above   Antibiotics:  PROGRESS NOTE  Patricia [name] WUJ:[id number] DOB: 1970/12/18 DOA: 09/14/2023 PCP: [name], PA-C  Brief History:  As per H&P written by Dr. [name] on 09/14/2023 Patricia [name] is a 52 y.o. female with medical history significant of ESRD on hemodialysis, (M,W,F), obesity class 3, T2DM, hypertension and asthma who presented with dyspnea, fevers and left neck pain.    Patient has a left upper extremity fistula that has been malfunctioning, she underwent Fistulogram on 08.27.24 by Dr [name] and then had a follow up with with vascular surgery in [place] 09/03/23, plan for further follow up with vascular ultrasound.  In the meanwhile she has been getting HD through a tunneled catheter in her left internal jugular vein.    Patient was noted to have a low grade fever on Wednesday, 09/25 during dialysis. She was treated with acetaminophen and completed HD. During that session, initially her left upper extremity fistula was used but she developed pain and discomfort, not able to complete HD by this route, then completed her treatment through her HD catheter.    The following day she felt a little better, but still not back to baseline, she continue having chills and generalized malaise.  Yesterday she had several episodes of vomiting prior to HD. She was medicated with ondansetron, acetaminophen and lomotil. She was able to complete her HD treatment through her HD catheter.    At home she continue to have chills and now developed pain in her left shoulder area, radiating into her head and left ear. No improving factors.  Assessment/Plan: * Severe sepsis secondary to MSSA bacteremia (HCC) -Severe sepsis with elevated leukocytosis, lactic acid, present on admission.  -Continue IV antibiotics--Ancef -Continue supportive care and follow clinical response -Dialysis catheter has been removed 9/30 -10/2 TEE--no vegetations -TTE now demonstrating vegetations. -Stable vital signs. -9/30 repeat  blood cultures>>neg to date   Acute on chronic hypoxemic and hypercapnic respiratory failure. (Sepsis related).  -Continue supplemental 02  -chronically on 2L at home -currently on 4L -Continue CPAP nightly. -9/30 CXR - vascular congestion   ESRD on dialysis [facility]) -Patient chronically Monday, Wednesday and Friday for dialysis purposes -Last treatment on 09/16/2023 -Nephrology service consulted and will follow recommendations. -Patient's hemodialysis catheter successfully removed after dialysis treatment on 09/16/2023. -10/3--temporary HD catheter placed by gen surg--appreciate Dr. [name]   Right Knee pain -xray right knee--neg effusion, neg fx -consulted ortho--advised against arthrocentesis, no effusion noted on exam, knee was not red or warm to touch noted no low likelihood of septic joint  -MR knee ordered per ID   Essential hypertension -Currently stable -Follow blood pressure and adjust antihypertensive regimen as needed. -restarted normodyne, nifedipine   GERD (gastroesophageal reflux disease) -Continue with PPI  -Lifestyle changes discussed with patient.   Type 2 diabetes mellitus with hyperlipidemia (HCC) -Continue sliding scale insulin and follow CBGs fluctuation with further adjustment to hypoglycemic regimen as required.   Class 3 obesity (HCC) -Body mass index is 43.78 kg/m. -Low-calorie diet, portion control and increase physical activity discussed with patient.   OSA -Continue CPAP nightly. -Patient instructed to be compliant. -Alert and following commands appropriately; no somnolence appreciated.         Family Communication:   son at bedside updated 10/3   Consultants:  renal, general surgery, ID   Code Status:  FULL    DVT Prophylaxis:  [place] Heparin      Procedures: As Listed in Progress Note Above   Antibiotics:  not well visualized. Right ventricular systolic function is normal. Left Atrium: Left atrial size was mildly dilated. No left atrial/left atrial appendage thrombus was detected. The LAA emptying velocity was 70 cm/s. Right Atrium: Right atrial size was mildly dilated. Pericardium: There is no evidence of pericardial effusion. Mitral Valve: The mitral valve is grossly normal. Trivial mitral valve regurgitation. Tricuspid Valve: The tricuspid valve is grossly normal. Tricuspid valve regurgitation is mild. Aortic Valve: The aortic valve is tricuspid. There is mild calcification of the aortic valve. Aortic valve regurgitation is not visualized. Aortic valve sclerosis/calcification is present, without any evidence of aortic stenosis. Pulmonic Valve: The pulmonic valve was grossly normal. Pulmonic valve regurgitation is trivial. Aorta: The aortic root is normal in size and structure. There is moderate (Grade III) plaque involving the descending aorta. IAS/Shunts: No atrial level shunt detected by color flow Doppler. Additional Comments: Spectral Doppler performed. Nona Dell MD Electronically signed by Nona Dell MD Signature Date/Time: 09/18/2023/12:57:59 PM    Final    DG Chest Port 1 View  Result Date: 09/16/2023 CLINICAL DATA:  Encounter removal of vascular catheter. EXAM: PORTABLE CHEST 1 VIEW  COMPARISON:  Radiograph 06/14/2023 FINDINGS: The previous left-sided dialysis catheter is been removed. No evidence of pneumothorax. Soft tissue attenuation from habitus limits detailed assessment. Cardiomegaly is stable. Suspected vascular congestion. No large pleural effusion. IMPRESSION: 1. The left-sided dialysis catheter has been removed. No evidence of pneumothorax. 2. Cardiomegaly with vascular congestion. Electronically Signed   By: Narda Rutherford M.D.   On: 09/16/2023 19:14   ECHOCARDIOGRAM COMPLETE  Result Date: 09/15/2023    ECHOCARDIOGRAM REPORT   Patient Name:   [facility] Date of Exam: 09/15/2023 Medical Rec #:  [id number]   Height:       63.0 in Accession #:    [id number]  Weight:       245.0 lb Date of Birth:  March 16, 1971  BSA:          2.108 m Patient Age:    51 years    BP:           125132/4757 mmHg Patient Gender: F           HR:           93 bpm. [facility] Procedure: 2D Echo, Cardiac Doppler and Color Doppler Indications:    Fever R50.9  History:        Patient has prior history of Echocardiogram examinations, most                 recent 11/19/2022. CHF; Risk Factors:Hypertension, Diabetes and                 Dyslipidemia. ESRD on dialysis [facility]), Breast cancer (HCC), OSA                 (obstructive sleep apnea), Covid-19.  Sonographer:    [name] RCS Referring Phys: [id number] MAURICIO DANIEL ARRIEN IMPRESSIONS  1. Left ventricular ejection fraction, by estimation, is 60 to 65%. The left ventricle has normal function. The left ventricle has no regional wall motion abnormalities. There is mild concentric left ventricular hypertrophy. Left ventricular diastolic parameters are consistent with Grade I diastolic dysfunction (impaired relaxation).  2. Right ventricular systolic function is normal. The right ventricular size is normal. Tricuspid regurgitation signal is inadequate for assessing PA pressure.  3. No evidence of mitral valve regurgitation.  4. The aortic valve is  grossly normal. Aortic valve regurgitation is not visualized. Mild  PROGRESS NOTE  Patricia [name] WUJ:[id number] DOB: 1970/12/18 DOA: 09/14/2023 PCP: [name], PA-C  Brief History:  As per H&P written by Dr. [name] on 09/14/2023 Patricia [name] is a 52 y.o. female with medical history significant of ESRD on hemodialysis, (M,W,F), obesity class 3, T2DM, hypertension and asthma who presented with dyspnea, fevers and left neck pain.    Patient has a left upper extremity fistula that has been malfunctioning, she underwent Fistulogram on 08.27.24 by Dr [name] and then had a follow up with with vascular surgery in [facility] 09/03/23, plan for further follow up with vascular ultrasound.  In the meanwhile she has been getting HD through a tunneled catheter in her left internal jugular vein.    Patient was noted to have a low grade fever on Wednesday, 09/25 during dialysis. She was treated with acetaminophen and completed HD. During that session, initially her left upper extremity fistula was used but she developed pain and discomfort, not able to complete HD by this route, then completed her treatment through her HD catheter.    The following day she felt a little better, but still not back to baseline, she continue having chills and generalized malaise.  Yesterday she had several episodes of vomiting prior to HD. She was medicated with ondansetron, acetaminophen and lomotil. She was able to complete her HD treatment through her HD catheter.    At home she continue to have chills and now developed pain in her left shoulder area, radiating into her head and left ear. No improving factors.  Assessment/Plan: * Severe sepsis secondary to MSSA bacteremia (HCC) -Severe sepsis with elevated leukocytosis, lactic acid, present on admission.  -Continue IV antibiotics--Ancef -Continue supportive care and follow clinical response -Dialysis catheter has been removed 9/30 -10/2 TEE--no vegetations -TTE now demonstrating vegetations. -Stable vital signs. -9/30 repeat  blood cultures>>neg to date   Acute on chronic hypoxemic and hypercapnic respiratory failure. (Sepsis related).  -Continue supplemental 02  -chronically on 2L at home -currently on 4L -Continue CPAP nightly. -9/30 CXR - vascular congestion   ESRD on dialysis [facility]) -Patient chronically Monday, Wednesday and Friday for dialysis purposes -Last treatment on 09/16/2023 -Nephrology service consulted and will follow recommendations. -Patient's hemodialysis catheter successfully removed after dialysis treatment on 09/16/2023. -10/3--temporary HD catheter placed by gen surg--appreciate Dr. [name]   Right Knee pain -xray right knee--neg effusion, neg fx -consulted ortho--advised against arthrocentesis, no effusion noted on exam, knee was not red or warm to touch noted no low likelihood of septic joint  -MR knee ordered per ID   Essential hypertension -Currently stable -Follow blood pressure and adjust antihypertensive regimen as needed. -restarted normodyne, nifedipine   GERD (gastroesophageal reflux disease) -Continue with PPI  -Lifestyle changes discussed with patient.   Type 2 diabetes mellitus with hyperlipidemia (HCC) -Continue sliding scale insulin and follow CBGs fluctuation with further adjustment to hypoglycemic regimen as required.   Class 3 obesity (HCC) -Body mass index is 43.78 kg/m. -Low-calorie diet, portion control and increase physical activity discussed with patient.   OSA -Continue CPAP nightly. -Patient instructed to be compliant. -Alert and following commands appropriately; no somnolence appreciated.         Family Communication:   son at bedside updated 10/3   Consultants:  renal, general surgery, ID   Code Status:  FULL    DVT Prophylaxis:  [place] Heparin      Procedures: As Listed in Progress Note Above   Antibiotics:  not well visualized. Right ventricular systolic function is normal. Left Atrium: Left atrial size was mildly dilated. No left atrial/left atrial appendage thrombus was detected. The LAA emptying velocity was 70 cm/s. Right Atrium: Right atrial size was mildly dilated. Pericardium: There is no evidence of pericardial effusion. Mitral Valve: The mitral valve is grossly normal. Trivial mitral valve regurgitation. Tricuspid Valve: The tricuspid valve is grossly normal. Tricuspid valve regurgitation is mild. Aortic Valve: The aortic valve is tricuspid. There is mild calcification of the aortic valve. Aortic valve regurgitation is not visualized. Aortic valve sclerosis/calcification is present, without any evidence of aortic stenosis. Pulmonic Valve: The pulmonic valve was grossly normal. Pulmonic valve regurgitation is trivial. Aorta: The aortic root is normal in size and structure. There is moderate (Grade III) plaque involving the descending aorta. IAS/Shunts: No atrial level shunt detected by color flow Doppler. Additional Comments: Spectral Doppler performed. Nona Dell MD Electronically signed by Nona Dell MD Signature Date/Time: 09/18/2023/12:57:59 PM    Final    DG Chest Port 1 View  Result Date: 09/16/2023 CLINICAL DATA:  Encounter removal of vascular catheter. EXAM: PORTABLE CHEST 1 VIEW  COMPARISON:  Radiograph 06/14/2023 FINDINGS: The previous left-sided dialysis catheter is been removed. No evidence of pneumothorax. Soft tissue attenuation from habitus limits detailed assessment. Cardiomegaly is stable. Suspected vascular congestion. No large pleural effusion. IMPRESSION: 1. The left-sided dialysis catheter has been removed. No evidence of pneumothorax. 2. Cardiomegaly with vascular congestion. Electronically Signed   By: Narda Rutherford M.D.   On: 09/16/2023 19:14   ECHOCARDIOGRAM COMPLETE  Result Date: 09/15/2023    ECHOCARDIOGRAM REPORT   Patient Name:   [facility] Date of Exam: 09/15/2023 Medical Rec #:  [id number]   Height:       63.0 in Accession #:    [id number]  Weight:       245.0 lb Date of Birth:  March 16, 1971  BSA:          2.108 m Patient Age:    51 years    BP:           125132/4757 mmHg Patient Gender: F           HR:           93 bpm. [facility] Procedure: 2D Echo, Cardiac Doppler and Color Doppler Indications:    Fever R50.9  History:        Patient has prior history of Echocardiogram examinations, most                 recent 11/19/2022. CHF; Risk Factors:Hypertension, Diabetes and                 Dyslipidemia. ESRD on dialysis [facility]), Breast cancer (HCC), OSA                 (obstructive sleep apnea), Covid-19.  Sonographer:    [name] RCS Referring Phys: [id number] MAURICIO DANIEL ARRIEN IMPRESSIONS  1. Left ventricular ejection fraction, by estimation, is 60 to 65%. The left ventricle has normal function. The left ventricle has no regional wall motion abnormalities. There is mild concentric left ventricular hypertrophy. Left ventricular diastolic parameters are consistent with Grade I diastolic dysfunction (impaired relaxation).  2. Right ventricular systolic function is normal. The right ventricular size is normal. Tricuspid regurgitation signal is inadequate for assessing PA pressure.  3. No evidence of mitral valve regurgitation.  4. The aortic valve is  grossly normal. Aortic valve regurgitation is not visualized. Mild  PROGRESS NOTE  Patricia [name] WUJ:[id number] DOB: 1970/12/18 DOA: 09/14/2023 PCP: [name], PA-C  Brief History:  As per H&P written by Dr. [name] on 09/14/2023 Patricia [name] is a 52 y.o. female with medical history significant of ESRD on hemodialysis, (M,W,F), obesity class 3, T2DM, hypertension and asthma who presented with dyspnea, fevers and left neck pain.    Patient has a left upper extremity fistula that has been malfunctioning, she underwent Fistulogram on 08.27.24 by Dr [name] and then had a follow up with with vascular surgery in [facility] 09/03/23, plan for further follow up with vascular ultrasound.  In the meanwhile she has been getting HD through a tunneled catheter in her left internal jugular vein.    Patient was noted to have a low grade fever on Wednesday, 09/25 during dialysis. She was treated with acetaminophen and completed HD. During that session, initially her left upper extremity fistula was used but she developed pain and discomfort, not able to complete HD by this route, then completed her treatment through her HD catheter.    The following day she felt a little better, but still not back to baseline, she continue having chills and generalized malaise.  Yesterday she had several episodes of vomiting prior to HD. She was medicated with ondansetron, acetaminophen and lomotil. She was able to complete her HD treatment through her HD catheter.    At home she continue to have chills and now developed pain in her left shoulder area, radiating into her head and left ear. No improving factors.  Assessment/Plan: * Severe sepsis secondary to MSSA bacteremia (HCC) -Severe sepsis with elevated leukocytosis, lactic acid, present on admission.  -Continue IV antibiotics--Ancef -Continue supportive care and follow clinical response -Dialysis catheter has been removed 9/30 -10/2 TEE--no vegetations -TTE now demonstrating vegetations. -Stable vital signs. -9/30 repeat  blood cultures>>neg to date   Acute on chronic hypoxemic and hypercapnic respiratory failure. (Sepsis related).  -Continue supplemental 02  -chronically on 2L at home -currently on 4L -Continue CPAP nightly. -9/30 CXR - vascular congestion   ESRD on dialysis [facility]) -Patient chronically Monday, Wednesday and Friday for dialysis purposes -Last treatment on 09/16/2023 -Nephrology service consulted and will follow recommendations. -Patient's hemodialysis catheter successfully removed after dialysis treatment on 09/16/2023. -10/3--temporary HD catheter placed by gen surg--appreciate Dr. [name]   Right Knee pain -xray right knee--neg effusion, neg fx -consulted ortho--advised against arthrocentesis, no effusion noted on exam, knee was not red or warm to touch noted no low likelihood of septic joint  -MR knee ordered per ID   Essential hypertension -Currently stable -Follow blood pressure and adjust antihypertensive regimen as needed. -restarted normodyne, nifedipine   GERD (gastroesophageal reflux disease) -Continue with PPI  -Lifestyle changes discussed with patient.   Type 2 diabetes mellitus with hyperlipidemia (HCC) -Continue sliding scale insulin and follow CBGs fluctuation with further adjustment to hypoglycemic regimen as required.   Class 3 obesity (HCC) -Body mass index is 43.78 kg/m. -Low-calorie diet, portion control and increase physical activity discussed with patient.   OSA -Continue CPAP nightly. -Patient instructed to be compliant. -Alert and following commands appropriately; no somnolence appreciated.         Family Communication:   son at bedside updated 10/3   Consultants:  renal, general surgery, ID   Code Status:  FULL    DVT Prophylaxis:  [place] Heparin      Procedures: As Listed in Progress Note Above   Antibiotics:  PROGRESS NOTE  Patricia [name] WUJ:[id number] DOB: 1970/12/18 DOA: 09/14/2023 PCP: [name], PA-C  Brief History:  As per H&P written by Dr. [name] on 09/14/2023 Patricia [name] is a 52 y.o. female with medical history significant of ESRD on hemodialysis, (M,W,F), obesity class 3, T2DM, hypertension and asthma who presented with dyspnea, fevers and left neck pain.    Patient has a left upper extremity fistula that has been malfunctioning, she underwent Fistulogram on 08.27.24 by Dr [name] and then had a follow up with with vascular surgery in [facility] 09/03/23, plan for further follow up with vascular ultrasound.  In the meanwhile she has been getting HD through a tunneled catheter in her left internal jugular vein.    Patient was noted to have a low grade fever on Wednesday, 09/25 during dialysis. She was treated with acetaminophen and completed HD. During that session, initially her left upper extremity fistula was used but she developed pain and discomfort, not able to complete HD by this route, then completed her treatment through her HD catheter.    The following day she felt a little better, but still not back to baseline, she continue having chills and generalized malaise.  Yesterday she had several episodes of vomiting prior to HD. She was medicated with ondansetron, acetaminophen and lomotil. She was able to complete her HD treatment through her HD catheter.    At home she continue to have chills and now developed pain in her left shoulder area, radiating into her head and left ear. No improving factors.  Assessment/Plan: * Severe sepsis secondary to MSSA bacteremia (HCC) -Severe sepsis with elevated leukocytosis, lactic acid, present on admission.  -Continue IV antibiotics--Ancef -Continue supportive care and follow clinical response -Dialysis catheter has been removed 9/30 -10/2 TEE--no vegetations -TTE now demonstrating vegetations. -Stable vital signs. -9/30 repeat  blood cultures>>neg to date   Acute on chronic hypoxemic and hypercapnic respiratory failure. (Sepsis related).  -Continue supplemental 02  -chronically on 2L at home -currently on 4L -Continue CPAP nightly. -9/30 CXR - vascular congestion   ESRD on dialysis [facility]) -Patient chronically Monday, Wednesday and Friday for dialysis purposes -Last treatment on 09/16/2023 -Nephrology service consulted and will follow recommendations. -Patient's hemodialysis catheter successfully removed after dialysis treatment on 09/16/2023. -10/3--temporary HD catheter placed by gen surg--appreciate Dr. [name]   Right Knee pain -xray right knee--neg effusion, neg fx -consulted ortho--advised against arthrocentesis, no effusion noted on exam, knee was not red or warm to touch noted no low likelihood of septic joint  -MR knee ordered per ID   Essential hypertension -Currently stable -Follow blood pressure and adjust antihypertensive regimen as needed. -restarted normodyne, nifedipine   GERD (gastroesophageal reflux disease) -Continue with PPI  -Lifestyle changes discussed with patient.   Type 2 diabetes mellitus with hyperlipidemia (HCC) -Continue sliding scale insulin and follow CBGs fluctuation with further adjustment to hypoglycemic regimen as required.   Class 3 obesity (HCC) -Body mass index is 43.78 kg/m. -Low-calorie diet, portion control and increase physical activity discussed with patient.   OSA -Continue CPAP nightly. -Patient instructed to be compliant. -Alert and following commands appropriately; no somnolence appreciated.         Family Communication:   son at bedside updated 10/3   Consultants:  renal, general surgery, ID   Code Status:  FULL    DVT Prophylaxis:  [place] Heparin      Procedures: As Listed in Progress Note Above   Antibiotics:  PROGRESS NOTE  Patricia [name] WUJ:[id number] DOB: 1970/12/18 DOA: 09/14/2023 PCP: [name], PA-C  Brief History:  As per H&P written by Dr. [name] on 09/14/2023 Patricia [name] is a 52 y.o. female with medical history significant of ESRD on hemodialysis, (M,W,F), obesity class 3, T2DM, hypertension and asthma who presented with dyspnea, fevers and left neck pain.    Patient has a left upper extremity fistula that has been malfunctioning, she underwent Fistulogram on 08.27.24 by Dr [name] and then had a follow up with with vascular surgery in [facility] 09/03/23, plan for further follow up with vascular ultrasound.  In the meanwhile she has been getting HD through a tunneled catheter in her left internal jugular vein.    Patient was noted to have a low grade fever on Wednesday, 09/25 during dialysis. She was treated with acetaminophen and completed HD. During that session, initially her left upper extremity fistula was used but she developed pain and discomfort, not able to complete HD by this route, then completed her treatment through her HD catheter.    The following day she felt a little better, but still not back to baseline, she continue having chills and generalized malaise.  Yesterday she had several episodes of vomiting prior to HD. She was medicated with ondansetron, acetaminophen and lomotil. She was able to complete her HD treatment through her HD catheter.    At home she continue to have chills and now developed pain in her left shoulder area, radiating into her head and left ear. No improving factors.  Assessment/Plan: * Severe sepsis secondary to MSSA bacteremia (HCC) -Severe sepsis with elevated leukocytosis, lactic acid, present on admission.  -Continue IV antibiotics--Ancef -Continue supportive care and follow clinical response -Dialysis catheter has been removed 9/30 -10/2 TEE--no vegetations -TTE now demonstrating vegetations. -Stable vital signs. -9/30 repeat  blood cultures>>neg to date   Acute on chronic hypoxemic and hypercapnic respiratory failure. (Sepsis related).  -Continue supplemental 02  -chronically on 2L at home -currently on 4L -Continue CPAP nightly. -9/30 CXR - vascular congestion   ESRD on dialysis [facility]) -Patient chronically Monday, Wednesday and Friday for dialysis purposes -Last treatment on 09/16/2023 -Nephrology service consulted and will follow recommendations. -Patient's hemodialysis catheter successfully removed after dialysis treatment on 09/16/2023. -10/3--temporary HD catheter placed by gen surg--appreciate Dr. [name]   Right Knee pain -xray right knee--neg effusion, neg fx -consulted ortho--advised against arthrocentesis, no effusion noted on exam, knee was not red or warm to touch noted no low likelihood of septic joint  -MR knee ordered per ID   Essential hypertension -Currently stable -Follow blood pressure and adjust antihypertensive regimen as needed. -restarted normodyne, nifedipine   GERD (gastroesophageal reflux disease) -Continue with PPI  -Lifestyle changes discussed with patient.   Type 2 diabetes mellitus with hyperlipidemia (HCC) -Continue sliding scale insulin and follow CBGs fluctuation with further adjustment to hypoglycemic regimen as required.   Class 3 obesity (HCC) -Body mass index is 43.78 kg/m. -Low-calorie diet, portion control and increase physical activity discussed with patient.   OSA -Continue CPAP nightly. -Patient instructed to be compliant. -Alert and following commands appropriately; no somnolence appreciated.         Family Communication:   son at bedside updated 10/3   Consultants:  renal, general surgery, ID   Code Status:  FULL    DVT Prophylaxis:  [place] Heparin      Procedures: As Listed in Progress Note Above   Antibiotics:  not well visualized. Right ventricular systolic function is normal. Left Atrium: Left atrial size was mildly dilated. No left atrial/left atrial appendage thrombus was detected. The LAA emptying velocity was 70 cm/s. Right Atrium: Right atrial size was mildly dilated. Pericardium: There is no evidence of pericardial effusion. Mitral Valve: The mitral valve is grossly normal. Trivial mitral valve regurgitation. Tricuspid Valve: The tricuspid valve is grossly normal. Tricuspid valve regurgitation is mild. Aortic Valve: The aortic valve is tricuspid. There is mild calcification of the aortic valve. Aortic valve regurgitation is not visualized. Aortic valve sclerosis/calcification is present, without any evidence of aortic stenosis. Pulmonic Valve: The pulmonic valve was grossly normal. Pulmonic valve regurgitation is trivial. Aorta: The aortic root is normal in size and structure. There is moderate (Grade III) plaque involving the descending aorta. IAS/Shunts: No atrial level shunt detected by color flow Doppler. Additional Comments: Spectral Doppler performed. Nona Dell MD Electronically signed by Nona Dell MD Signature Date/Time: 09/18/2023/12:57:59 PM    Final    DG Chest Port 1 View  Result Date: 09/16/2023 CLINICAL DATA:  Encounter removal of vascular catheter. EXAM: PORTABLE CHEST 1 VIEW  COMPARISON:  Radiograph 06/14/2023 FINDINGS: The previous left-sided dialysis catheter is been removed. No evidence of pneumothorax. Soft tissue attenuation from habitus limits detailed assessment. Cardiomegaly is stable. Suspected vascular congestion. No large pleural effusion. IMPRESSION: 1. The left-sided dialysis catheter has been removed. No evidence of pneumothorax. 2. Cardiomegaly with vascular congestion. Electronically Signed   By: Narda Rutherford M.D.   On: 09/16/2023 19:14   ECHOCARDIOGRAM COMPLETE  Result Date: 09/15/2023    ECHOCARDIOGRAM REPORT   Patient Name:   [facility] Date of Exam: 09/15/2023 Medical Rec #:  [id number]   Height:       63.0 in Accession #:    [id number]  Weight:       245.0 lb Date of Birth:  March 16, 1971  BSA:          2.108 m Patient Age:    51 years    BP:           125132/4757 mmHg Patient Gender: F           HR:           93 bpm. [facility] Procedure: 2D Echo, Cardiac Doppler and Color Doppler Indications:    Fever R50.9  History:        Patient has prior history of Echocardiogram examinations, most                 recent 11/19/2022. CHF; Risk Factors:Hypertension, Diabetes and                 Dyslipidemia. ESRD on dialysis [facility]), Breast cancer (HCC), OSA                 (obstructive sleep apnea), Covid-19.  Sonographer:    [name] RCS Referring Phys: [id number] MAURICIO DANIEL ARRIEN IMPRESSIONS  1. Left ventricular ejection fraction, by estimation, is 60 to 65%. The left ventricle has normal function. The left ventricle has no regional wall motion abnormalities. There is mild concentric left ventricular hypertrophy. Left ventricular diastolic parameters are consistent with Grade I diastolic dysfunction (impaired relaxation).  2. Right ventricular systolic function is normal. The right ventricular size is normal. Tricuspid regurgitation signal is inadequate for assessing PA pressure.  3. No evidence of mitral valve regurgitation.  4. The aortic valve is  grossly normal. Aortic valve regurgitation is not visualized. Mild

## 2023-09-19 NOTE — Procedures (Signed)
Procedure Note  09/19/23   Preoperative Diagnosis: End stage renal disease, bacteremia, sepsis, dialysis access needed    Postoperative Diagnosis: Same   Procedure(s) Performed: Trialysis Line placement, left femoral vein    Surgeon: Leatrice Jewels. [name], MD   Assistants: None   Anesthesia: 1% lidocaine    Complications: None    Indications: Ms. Gewirtz is a 52 y.o. with bacteremia and ESRD who had to have her tunneled dialysis line removed and a line holiday. She needs a temporary line now.  I discussed the risk and benefits of placement of the central line with her, including but not limited to bleeding, infection, and risk of injury to vessels. She has given consent for the procedure.    Procedure: The patient placed supine. The left groin was clipped. The left groin was prepped and draped in the usual sterile fashion.  Wearing full gown and gloves, I performed the procedure.  One percent lidocaine was used for local anesthesia. An ultrasound was utilized to assess the left femoral vein.  The needle with syringe was advanced into the vein with dark venous return, and a wire was placed using the Seldinger technique without difficulty.  The skin was knicked and a dilator was placed, and the three lumen trialysis catheter was placed over the wire with continued control of the wire.  There was good draw back of blood from all three lumens and each flushed easily with saline.  The catheter was secured in 2 points with 2-0 silk and a biopatch and dressing was placed.   The dialysis lines were packed with heparin.   Algis Greenhouse, MD [facility] [address] [place], [place] [address] [phone] (office)

## 2023-09-19 NOTE — Progress Notes (Signed)
Patient ID: Jadaliz Utley, female   DOB: 10-Oct-1971, 52 y.o.   MRN: [id number] S: Not feeling well today.  C/o N/V and right sided chest discomfort O:BP (!) 151/59   Pulse 86   Temp 97.9 F (36.6 C) (Oral)   Resp (!) 27   Ht 5\' 3"  (1.6 m)   Wt 112.1 kg   SpO2 94%   BMI 43.78 kg/m   Intake/Output Summary (Last 24 hours) at 09/19/2023 0907 Last data filed at 09/19/2023 0200 Gross per 24 hour  Intake 750 ml  Output 200 ml  Net 550 ml   Intake/Output: I/O last 3 completed shifts: In: 800 [P.O.:400; I.V.:350; IV Piggyback:50] Out: 200 [Emesis/NG output:200]  Intake/Output this shift:  No intake/output data recorded. Weight change:  Gen: fatigued, NAD CVS: RRR Resp:CTA Abd: +BS, soft, NT/ND Ext: no edema, LUE AVF +T/B  Recent Labs  Lab 09/14/23 1716 09/15/23 0611 09/16/23 1319 09/18/23 0856  NA 129* 129* 131* 131*  K 3.6 4.2 4.2 4.1  CL 90* 89* 90* 90*  CO2 20* 20* 17* 21*  GLUCOSE 257* 215* 188* 235*  BUN 44* 57* 88* 79*  CREATININE 10.30* 11.65* 14.29* 11.94*  ALBUMIN 3.8  --  3.1*  --   CALCIUM 9.0 9.1 9.2 9.1  PHOS  --   --  7.6*  --   AST 17  --   --   --   ALT 13  --   --   --    Liver Function Tests: Recent Labs  Lab 09/14/23 1716 09/16/23 1319  AST 17  --   ALT 13  --   ALKPHOS 88  --   BILITOT 0.4  --   PROT 7.7  --   ALBUMIN 3.8 3.1*   No results for input(s): "LIPASE", "AMYLASE" in the last 168 hours. No results for input(s): "AMMONIA" in the last 168 hours. CBC: Recent Labs  Lab 09/14/23 1716 09/15/23 0611 09/16/23 1319 09/18/23 1622  WBC 22.0* 22.1* 13.1* 15.0*  NEUTROABS 19.7*  --   --   --   HGB 8.2* 9.9* 8.8* 8.7*  HCT 26.6* 32.2* 27.5* 28.1*  MCV 92.7 92.5 89.0 90.6  PLT 187 162 176 231   Cardiac Enzymes: No results for input(s): "CKTOTAL", "CKMB", "CKMBINDEX", "TROPONINI" in the last 168 hours. CBG: Recent Labs  Lab 09/18/23 1019 09/18/23 1237 09/18/23 1627 09/18/23 1950 09/19/23 0736  GLUCAP 194* 180* 220* 263* 205*     Iron Studies: No results for input(s): "IRON", "TIBC", "TRANSFERRIN", "FERRITIN" in the last 72 hours. Studies/Results: DG Knee 1-2 Views Right  Result Date: 09/18/2023 CLINICAL DATA:  Right knee pain.  No known injury. EXAM: RIGHT KNEE - 1-2 VIEW COMPARISON:  None Available. FINDINGS: There is diffuse decreased bone mineralization. No joint effusion. Joint spaces are preserved. No acute fracture or dislocation. High-grade atherosclerotic calcifications. IMPRESSION: 1. No significant osteoarthritis. 2. No acute fracture or dislocation. 3. High-grade atherosclerotic calcifications. Electronically Signed   By: Neita Garnet M.D.   On: 09/18/2023 17:59   ECHO TEE  Result Date: 09/18/2023    TRANSESOPHOGEAL ECHO REPORT   Patient Name:   [name] Date of Exam: 09/18/2023 Medical Rec #:  [id number]   Height:       63.0 in Accession #:    [id number]  Weight:       247.1 lb Date of Birth:  12/27/1970  BSA:          2.116 m Patient Age:    6  years    BP:           165/81 mmHg Patient Gender: F           HR:           91 bpm. [facility] Procedure: Transesophageal Echo, Cardiac Doppler and Color Doppler Indications:    Bacteremia R78.81  History:        Patient has prior history of Echocardiogram examinations, most                 recent 09/15/2023. CHF; Risk Factors:Hypertension, Diabetes and                 Dyslipidemia. ESRD on dialysis [facility]), Breast cancer (HCC), OSA                 (obstructive sleep apnea), Covid-19.  Sonographer:    [name] RCS Referring Phys: [id number] [name] PROCEDURE: After discussion of the risks and benefits of a TEE, an informed consent was obtained from the patient. TEE procedure time was 7 minutes. The transesophogeal probe was passed without difficulty through the esophogus of the patient. Imaged were  obtained with the patient in a left lateral decubitus position. Local oropharyngeal anesthetic was provided with viscous lidocaine. Sedation  performed by different physician. The patient was monitored while under deep sedation. Image quality was good. The patient's vital signs; including heart rate, blood pressure, and oxygen saturation; remained stable throughout the procedure. The patient developed no complications during the procedure.  IMPRESSIONS  1. Left ventricular ejection fraction, by estimation, is 60 to 65%. The left ventricle has normal function. The left ventricle has no regional wall motion abnormalities. There is mild concentric left ventricular hypertrophy.  2. Right ventricular systolic function is normal. The right ventricular size is normal.  3. Left atrial size was mildly dilated. No left atrial/left atrial appendage thrombus was detected. The LAA emptying velocity was 70 cm/s.  4. Right atrial size was mildly dilated.  5. The mitral valve is grossly normal. Trivial mitral valve regurgitation.  6. The aortic valve is tricuspid. There is mild calcification of the aortic valve. Aortic valve regurgitation is not visualized. Aortic valve sclerosis/calcification is present, without any evidence of aortic stenosis.  7. There is Moderate (Grade III) plaque involving the descending aorta. Conclusion(s)/Recommendation(s): No valvular vegetations. FINDINGS  Left Ventricle: Left ventricular ejection fraction, by estimation, is 60 to 65%. The left ventricle has normal function. The left ventricle has no regional wall motion abnormalities. The left ventricular internal cavity size was normal in size. There is  mild concentric left ventricular hypertrophy. Right Ventricle: The right ventricular size is normal. Right vetricular wall thickness was not well visualized. Right ventricular systolic function is normal. Left Atrium: Left atrial size was mildly dilated. No left atrial/left atrial appendage thrombus was detected. The LAA emptying velocity was 70 cm/s. Right Atrium: Right atrial size was mildly dilated. Pericardium: There is no evidence of  pericardial effusion. Mitral Valve: The mitral valve is grossly normal. Trivial mitral valve regurgitation. Tricuspid Valve: The tricuspid valve is grossly normal. Tricuspid valve regurgitation is mild. Aortic Valve: The aortic valve is tricuspid. There is mild calcification of the aortic valve. Aortic valve regurgitation is not visualized. Aortic valve sclerosis/calcification is present, without any evidence of aortic stenosis. Pulmonic Valve: The pulmonic valve was grossly normal. Pulmonic valve regurgitation is trivial. Aorta: The aortic root is normal in size and structure. There is moderate (Grade III) plaque involving  the descending aorta. IAS/Shunts: No atrial level shunt detected by color flow Doppler. Additional Comments: Spectral Doppler performed. Nona Dell MD Electronically signed by Nona Dell MD Signature Date/Time: 09/18/2023/12:57:59 PM    Final     aspirin EC  81 mg Oral Daily   Chlorhexidine Gluconate Cloth  6 each Topical Daily   Chlorhexidine Gluconate Cloth  6 each Topical Q0600   cinacalcet  30 mg Oral Q breakfast   diclofenac Sodium  4 g Topical QID   heparin injection (subcutaneous)  5,000 Units Subcutaneous Q8H   insulin aspart  0-5 Units Subcutaneous QHS   insulin aspart  0-6 Units Subcutaneous TID WC   insulin glargine-yfgn  4 Units Subcutaneous QHS   labetalol  100 mg Oral BID   loratadine  10 mg Oral Daily   montelukast  10 mg Oral QHS   pantoprazole (PROTONIX) IV  40 mg Intravenous Q12H   prochlorperazine  10 mg Intravenous Once   rOPINIRole  2 mg Oral QHS   rosuvastatin  20 mg Oral Daily   sevelamer carbonate  1,600 mg Oral BID WC   sevelamer carbonate  2,400 mg Oral Q lunch    BMET    Component Value Date/Time   NA 131 (L) 09/18/2023 0856   K 4.1 09/18/2023 0856   CL 90 (L) 09/18/2023 0856   CO2 21 (L) 09/18/2023 0856   GLUCOSE 235 (H) 09/18/2023 0856   BUN 79 (H) 09/18/2023 0856   CREATININE 11.94 (H) 09/18/2023 0856   CREATININE 5.93 (H)  06/29/2019 1210   CALCIUM 9.1 09/18/2023 0856   GFRNONAA 3 (L) 09/18/2023 0856   GFRNONAA 8 (L) 06/29/2019 1210   GFRAA 4 (L) 08/04/2020 0452   GFRAA 9 (L) 06/29/2019 1210   CBC    Component Value Date/Time   WBC 15.0 (H) 09/18/2023 1622   RBC 3.10 (L) 09/18/2023 1622   HGB 8.7 (L) 09/18/2023 1622   HCT 28.1 (L) 09/18/2023 1622   PLT 231 09/18/2023 1622   MCV 90.6 09/18/2023 1622   MCH 28.1 09/18/2023 1622   MCHC 31.0 09/18/2023 1622   RDW 14.7 09/18/2023 1622   LYMPHSABS 0.3 (L) 09/14/2023 1716   MONOABS 1.0 09/14/2023 1716   EOSABS 1.2 (H) 09/14/2023 1716   BASOSABS 0.1 09/14/2023 1716     Assessment/Plan:  MSSA sepsis/bacteremia - presumably due to [facility] exit site and tunnel infection.  Currently on ancef.  TDC removed 09/16/23 and to have new TDC placed today by IR.  TEE negative for vegetations. ESRD - normally on MWF but off schedule due to [facility] infection.  To have new TDC placed today by IR followed by HD. 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  Unable to cannulate venous portion.  Awaiting new TDC placement today.   Irena Cords, MD [employer] [phone]

## 2023-09-20 ENCOUNTER — Inpatient Hospital Stay (HOSPITAL_COMMUNITY): Payer: Medicare Other

## 2023-09-20 DIAGNOSIS — E66813 Obesity, class 3: Secondary | ICD-10-CM | POA: Diagnosis not present

## 2023-09-20 DIAGNOSIS — R6520 Severe sepsis without septic shock: Secondary | ICD-10-CM | POA: Diagnosis not present

## 2023-09-20 DIAGNOSIS — A419 Sepsis, unspecified organism: Secondary | ICD-10-CM | POA: Diagnosis not present

## 2023-09-20 DIAGNOSIS — N186 End stage renal disease: Secondary | ICD-10-CM | POA: Diagnosis not present

## 2023-09-20 LAB — GLUCOSE, CAPILLARY
Glucose-Capillary: 325 mg/dL — ABNORMAL HIGH (ref 70–99)
Glucose-Capillary: 338 mg/dL — ABNORMAL HIGH (ref 70–99)
Glucose-Capillary: 405 mg/dL — ABNORMAL HIGH (ref 70–99)
Glucose-Capillary: 223 mg/dL — ABNORMAL HIGH (ref 70–99)

## 2023-09-20 MED ORDER — INSULIN ASPART 100 UNIT/ML IJ SOLN
10.0000 [IU] | Freq: Once | INTRAMUSCULAR | Status: AC
  Administered 2023-09-20: 10 [IU] via SUBCUTANEOUS

## 2023-09-20 MED ORDER — INSULIN GLARGINE-YFGN 100 UNIT/ML ~~LOC~~ SOLN
4.0000 [IU] | Freq: Two times a day (BID) | SUBCUTANEOUS | Status: DC
  Administered 2023-09-20 – 2023-09-22 (×5): 4 [IU] via SUBCUTANEOUS
  Filled 2023-09-20 (×7): qty 0.04

## 2023-09-20 MED ORDER — SENNA 8.6 MG PO TABS
2.0000 | ORAL_TABLET | Freq: Every day | ORAL | Status: DC
  Administered 2023-09-20 – 2023-09-23 (×4): 17.2 mg via ORAL
  Filled 2023-09-20 (×5): qty 2

## 2023-09-20 MED ORDER — POLYETHYLENE GLYCOL 3350 17 G PO PACK
17.0000 g | PACK | Freq: Every day | ORAL | Status: DC
  Administered 2023-09-20 – 2023-09-23 (×3): 17 g via ORAL
  Filled 2023-09-20 (×5): qty 1

## 2023-09-20 MED ORDER — LINACLOTIDE 145 MCG PO CAPS
290.0000 ug | ORAL_CAPSULE | Freq: Every day | ORAL | Status: DC
  Administered 2023-09-21 – 2023-09-23 (×3): 290 ug via ORAL
  Filled 2023-09-20 (×4): qty 2

## 2023-09-20 NOTE — Progress Notes (Signed)
Pharmacy Antibiotic Note  Patricia [name] is a 52 y.o. female admitted on 09/14/2023 with MSSA bacteremia.  Patient has a past medical history of HTN, DM2 and ESRD on hemodialysis. Pharmacy has been consulted for  cefazolin.  Plan: Continue Ancef 1g q24 for MSSA No dose adjust expected When [facility] is placed and HD schedule is stable will change to dialysis dosing    Height: 5\' 3"  (160 cm) Weight: 106 kg (233 lb 11 oz) IBW/kg (Calculated) : 52.4  Temp (24hrs), Avg:98.3 F (36.8 C), Min:98.1 F (36.7 C), Max:98.7 F (37.1 C)  Recent Labs  Lab 09/14/23 1716 09/14/23 1842 09/15/23 0611 09/16/23 1319 09/18/23 0856 09/18/23 1622 09/19/23 1640  WBC 22.0*  --  22.1* 13.1*  --  15.0* 15.9*  CREATININE 10.30*  --  11.65* 14.29* 11.94*  --  15.60*  LATICACIDVEN 2.9* 1.4  --   --   --   --   --     Estimated Creatinine Clearance: 4.9 mL/min (A) (by C-G formula based on SCr of 15.6 mg/dL (H)).    Allergies  Allergen Reactions   Norvasc [Amlodipine Besylate] Rash and Other (See Comments)    Dizziness    Reglan [Metoclopramide] Other (See Comments)    Hallucinations     Antimicrobials this admission: 9/28 Vanc >> 9/29 9/28 Cefepime >> 9/29 9/29 Cefazolin>>  Microbiology results: 9/28 bld x2: mssa 9/30 bldx2: ngtd 9/28 mrsa neg  Thank you for allowing pharmacy to be a part of this patient's care.  Sheppard Coil PharmD., BCPS Clinical Pharmacist 09/20/2023 11:26 AM

## 2023-09-20 NOTE — Inpatient Diabetes Management (Signed)
Inpatient Diabetes Program Recommendations  AACE/ADA: New Consensus Statement on Inpatient Glycemic Control  Target Ranges:  Prepandial:   less than 140 mg/dL      Peak postprandial:   less than 180 mg/dL (1-2 hours)      Critically ill patients:  140 - 180 mg/dL    Latest Reference Range & Units 09/19/23 07:36 09/19/23 11:50 09/19/23 16:06 09/19/23 21:34 09/20/23 07:53  Glucose-Capillary 70 - 99 mg/dL 409 (H) 811 (H) 914 (H) 185 (H) 325 (H)   Review of Glycemic Control  Diabetes history: DM2 Outpatient Diabetes medications: Glipizide 5 mg QAM, Tresiba 20 units daily Current orders for Inpatient glycemic control: Semglee 4 units at bedtime, Novolog 0-6 units TID with meals, Novolog 0-5 units QHS   Inpatient Diabetes Program Recommendations:     Insulin: CBGs ranged from 185-316 mg/dl on 78/2 and CBG 956 mg/dl today.  Please consider changing Semglee to 4 units BID (to start at 10am today).  Thanks, Orlando Penner, RN, MSN, CDCES Diabetes Coordinator Inpatient Diabetes Program [phone] (Team Pager from 8am to 5pm)

## 2023-09-20 NOTE — Progress Notes (Signed)
insulin aspart  0-5 Units Subcutaneous QHS   insulin aspart  0-6 Units Subcutaneous TID WC   insulin glargine-yfgn  4 Units Subcutaneous BID   labetalol  100 mg Oral BID   loratadine  10 mg Oral Daily   montelukast  10 mg Oral QHS   pantoprazole (PROTONIX) IV  40 mg Intravenous Q12H   polyethylene glycol  17 g Oral Daily   rOPINIRole  2 mg Oral QHS   rosuvastatin  20 mg Oral Daily   senna  2 tablet Oral Daily   sevelamer carbonate  1,600 mg Oral BID WC   sevelamer carbonate  2,400 mg Oral Q lunch   Continuous Infusions:  anticoagulant sodium citrate      ceFAZolin (ANCEF) IV 1 g (09/20/23 1704)    Procedures/Studies: DG Knee 1-2 Views Right  Result Date: 09/18/2023 CLINICAL DATA:  Right knee pain.   No known injury. EXAM: RIGHT KNEE - 1-2 VIEW COMPARISON:  None Available. FINDINGS: There is diffuse decreased bone mineralization. No joint effusion. Joint spaces are preserved. No acute fracture or dislocation. High-grade atherosclerotic calcifications. IMPRESSION: 1. No significant osteoarthritis. 2. No acute fracture or dislocation. 3. High-grade atherosclerotic calcifications. Electronically Signed   By: Neita Garnet M.D.   On: 09/18/2023 17:59   ECHO TEE  Result Date: 09/18/2023    TRANSESOPHOGEAL ECHO REPORT   Patient Name:   Patricia [name] Date of Exam: 09/18/2023 Medical Rec #:  [id number]   Height:       63.0 in Accession #:    [id number]  Weight:       247.1 lb Date of Birth:  July 20, 1971  BSA:          2.116 m Patient Age:    52 years    BP:           165/81 mmHg Patient Gender: F           HR:           91 bpm. Exam Location:  [name] Procedure: Transesophageal Echo, Cardiac Doppler and Color Doppler Indications:    Bacteremia R78.81  History:        Patient has prior history of Echocardiogram examinations, most                 recent 09/15/2023. CHF; Risk Factors:Hypertension, Diabetes and                 Dyslipidemia. ESRD on dialysis [facility]), Breast cancer (HCC), OSA                 (obstructive sleep apnea), Covid-19.  Sonographer:    [name] RCS Referring Phys: [id number] [name] PROCEDURE: After discussion of the risks and benefits of a TEE, an informed consent was obtained from the patient. TEE procedure time was 7 minutes. The transesophogeal probe was passed without difficulty through the esophogus of the patient. Imaged were  obtained with the patient in a left lateral decubitus position. Local oropharyngeal anesthetic was provided with viscous lidocaine. Sedation performed by different physician. The patient was monitored while under deep sedation. Image quality was good. The patient's vital signs; including heart rate, blood pressure, and oxygen saturation; remained stable  throughout the procedure. The patient developed no complications during the procedure.  IMPRESSIONS  1. Left ventricular ejection fraction, by estimation, is 60 to 65%. The left ventricle has normal function. The left ventricle has no regional wall motion abnormalities. There is mild concentric left ventricular hypertrophy.  PROGRESS NOTE  Patricia [name] ZOX:[id number] DOB: June 23, 1971 DOA: 09/14/2023 PCP: [name], PA-C  Brief History:  As per H&P written by Dr. [name] on 09/14/2023 Patricia [name] is a 52 y.o. female with medical history significant of ESRD on hemodialysis, (M,W,F), obesity class 3, T2DM, hypertension and asthma who presented with dyspnea, fevers and left neck pain.    Patient has a left upper extremity fistula that has been malfunctioning, she underwent Fistulogram on 08.27.24 by Dr [name] and then had a follow up with with vascular surgery in [facility] 09/03/23, plan for further follow up with vascular ultrasound.  In the meanwhile she has been getting HD through a tunneled catheter in her left internal jugular vein.    Patient was noted to have a low grade fever on Wednesday, 09/25 during dialysis. She was treated with acetaminophen and completed HD. During that session, initially her left upper extremity fistula was used but she developed pain and discomfort, not able to complete HD by this route, then completed her treatment through her HD catheter.    The following day she felt a little better, but still not back to baseline, she continue having chills and generalized malaise.  Yesterday she had several episodes of vomiting prior to HD. She was medicated with ondansetron, acetaminophen and lomotil. She was able to complete her HD treatment through her HD catheter.    At home she continue to have chills and now developed pain in her left shoulder area, radiating into her head and left ear. No improving factors.   Assessment/Plan: * Severe sepsis secondary to MSSA bacteremia (HCC) -Severe sepsis with elevated leukocytosis, lactic acid, present on admission.  -Continue IV antibiotics--Ancef -Old Dialysis catheter has been removed 9/30 -10/2 TEE--no vegetations -TTE now demonstrating vegetations. -Stable vital signs. -9/30 repeat blood cultures>>neg to date -09/19/23 blood  cultures--neg to date   Acute on chronic hypoxemic and hypercapnic respiratory failure. (Sepsis related).  -Continue supplemental 02  -chronically on 2L at home -currently on 4L -Continue CPAP nightly. -9/30 CXR - vascular congestion   ESRD on dialysis [facility]) -Patient chronically Monday, Wednesday and Friday for dialysis purposes -Last treatment on 09/16/2023 -Nephrology service consulted and will follow recommendations. -Patient's hemodialysis catheter successfully removed after dialysis treatment on 09/16/2023. -10/3--temporary HD catheter placed by gen surg--appreciate Dr. [name] -10/3--last HD; plan for HD 10/5   Right Knee pain -xray right knee--neg effusion, neg fx -consulted ortho--advised against arthrocentesis, no effusion noted on exam, knee was not red or warm to touch noted no low likelihood of septic joint  -MR knee ordered per ID -overall improving with 90 degree flexion/extension with minimal pain   Essential hypertension -Currently stable -Follow blood pressure and adjust antihypertensive regimen as needed. -restarted normodyne, nifedipine   GERD (gastroesophageal reflux disease) -Continue with PPI  -Lifestyle changes discussed with patient.   Type 2 diabetes mellitus with hyperlipidemia (HCC) -Continue sliding scale insulin and follow CBGs fluctuation with further adjustment to hypoglycemic regimen as required. -increase semglee to 4 units BID   Class 3 obesity (HCC) -Body mass index is 43.78 kg/m. -Low-calorie diet, portion control and increase physical activity discussed with patient.   OSA -Continue CPAP nightly. -Patient instructed to be compliant. -Alert and following commands appropriately; no somnolence appreciated.         Family Communication:   son at bedside updated 10/3   Consultants:  renal, general surgery, ID   Code Status:  FULL  2. Right ventricular systolic function is normal. The right ventricular size is normal.  3. Left atrial size was mildly dilated. No left atrial/left atrial appendage thrombus was detected. The LAA emptying velocity was 70 cm/s.  4. Right atrial size was mildly dilated.  5. The mitral valve is grossly normal. Trivial mitral valve regurgitation.  6. The aortic valve is tricuspid. There is mild calcification of the aortic valve. Aortic valve regurgitation is not visualized. Aortic valve sclerosis/calcification is present, without any evidence of aortic stenosis.  7. There is Moderate (Grade III) plaque involving the descending aorta. Conclusion(s)/Recommendation(s): No valvular vegetations. FINDINGS  Left Ventricle: Left ventricular ejection fraction, by estimation, is 60 to 65%. The left ventricle has normal function. The left ventricle has no regional wall motion abnormalities. The left ventricular internal cavity size was normal in size. There is  mild concentric left ventricular hypertrophy. Right Ventricle: The right ventricular size is normal. Right vetricular wall thickness was not well visualized. Right ventricular systolic function is normal. Left Atrium: Left atrial size was mildly dilated. No left atrial/left atrial appendage thrombus was detected. The LAA emptying velocity was 70 cm/s. Right Atrium: Right atrial size was mildly dilated. Pericardium: There is no evidence of pericardial effusion. Mitral Valve: The mitral valve is grossly normal. Trivial mitral valve regurgitation. Tricuspid Valve: The tricuspid valve is grossly normal. Tricuspid valve regurgitation is mild. Aortic Valve:  The aortic valve is tricuspid. There is mild calcification of the aortic valve. Aortic valve regurgitation is not visualized. Aortic valve sclerosis/calcification is present, without any evidence of aortic stenosis. Pulmonic Valve: The pulmonic valve was grossly normal. Pulmonic valve regurgitation is trivial. Aorta: The aortic root is normal in size and structure. There is moderate (Grade III) plaque involving the descending aorta. IAS/Shunts: No atrial level shunt detected by color flow Doppler. Additional Comments: Spectral Doppler performed. Nona Dell MD Electronically signed by Nona Dell MD Signature Date/Time: 09/18/2023/12:57:59 PM    Final    DG Chest Port 1 View  Result Date: 09/16/2023 CLINICAL DATA:  Encounter removal of vascular catheter. EXAM: PORTABLE CHEST 1 VIEW COMPARISON:  Radiograph 06/14/2023 FINDINGS: The previous left-sided dialysis catheter is been removed. No evidence of pneumothorax. Soft tissue attenuation from habitus limits detailed assessment. Cardiomegaly is stable. Suspected vascular congestion. No large pleural effusion. IMPRESSION: 1. The left-sided dialysis catheter has been removed. No evidence of pneumothorax. 2. Cardiomegaly with vascular congestion. Electronically Signed   By: Narda Rutherford M.D.   On: 09/16/2023 19:14   ECHOCARDIOGRAM COMPLETE  Result Date: 09/15/2023    ECHOCARDIOGRAM REPORT   Patient Name:   Patricia [name] Date of Exam: 09/15/2023 Medical Rec #:  [id number]   Height:       63.0 in Accession #:    [id number]  Weight:       245.0 lb Date of Birth:  Jan 17, 1971  BSA:          2.108 m Patient Age:    52 years    BP:           125132/4757 mmHg Patient Gender: F           HR:           93 bpm. Exam Location:  [name] Procedure: 2D Echo, Cardiac Doppler and Color Doppler Indications:    Fever R50.9  History:        Patient has prior history of Echocardiogram examinations, most  PROGRESS NOTE  Patricia [name] ZOX:[id number] DOB: June 23, 1971 DOA: 09/14/2023 PCP: [name], PA-C  Brief History:  As per H&P written by Dr. [name] on 09/14/2023 Patricia [name] is a 52 y.o. female with medical history significant of ESRD on hemodialysis, (M,W,F), obesity class 3, T2DM, hypertension and asthma who presented with dyspnea, fevers and left neck pain.    Patient has a left upper extremity fistula that has been malfunctioning, she underwent Fistulogram on 08.27.24 by Dr [name] and then had a follow up with with vascular surgery in [facility] 09/03/23, plan for further follow up with vascular ultrasound.  In the meanwhile she has been getting HD through a tunneled catheter in her left internal jugular vein.    Patient was noted to have a low grade fever on Wednesday, 09/25 during dialysis. She was treated with acetaminophen and completed HD. During that session, initially her left upper extremity fistula was used but she developed pain and discomfort, not able to complete HD by this route, then completed her treatment through her HD catheter.    The following day she felt a little better, but still not back to baseline, she continue having chills and generalized malaise.  Yesterday she had several episodes of vomiting prior to HD. She was medicated with ondansetron, acetaminophen and lomotil. She was able to complete her HD treatment through her HD catheter.    At home she continue to have chills and now developed pain in her left shoulder area, radiating into her head and left ear. No improving factors.   Assessment/Plan: * Severe sepsis secondary to MSSA bacteremia (HCC) -Severe sepsis with elevated leukocytosis, lactic acid, present on admission.  -Continue IV antibiotics--Ancef -Old Dialysis catheter has been removed 9/30 -10/2 TEE--no vegetations -TTE now demonstrating vegetations. -Stable vital signs. -9/30 repeat blood cultures>>neg to date -09/19/23 blood  cultures--neg to date   Acute on chronic hypoxemic and hypercapnic respiratory failure. (Sepsis related).  -Continue supplemental 02  -chronically on 2L at home -currently on 4L -Continue CPAP nightly. -9/30 CXR - vascular congestion   ESRD on dialysis [facility]) -Patient chronically Monday, Wednesday and Friday for dialysis purposes -Last treatment on 09/16/2023 -Nephrology service consulted and will follow recommendations. -Patient's hemodialysis catheter successfully removed after dialysis treatment on 09/16/2023. -10/3--temporary HD catheter placed by gen surg--appreciate Dr. [name] -10/3--last HD; plan for HD 10/5   Right Knee pain -xray right knee--neg effusion, neg fx -consulted ortho--advised against arthrocentesis, no effusion noted on exam, knee was not red or warm to touch noted no low likelihood of septic joint  -MR knee ordered per ID -overall improving with 90 degree flexion/extension with minimal pain   Essential hypertension -Currently stable -Follow blood pressure and adjust antihypertensive regimen as needed. -restarted normodyne, nifedipine   GERD (gastroesophageal reflux disease) -Continue with PPI  -Lifestyle changes discussed with patient.   Type 2 diabetes mellitus with hyperlipidemia (HCC) -Continue sliding scale insulin and follow CBGs fluctuation with further adjustment to hypoglycemic regimen as required. -increase semglee to 4 units BID   Class 3 obesity (HCC) -Body mass index is 43.78 kg/m. -Low-calorie diet, portion control and increase physical activity discussed with patient.   OSA -Continue CPAP nightly. -Patient instructed to be compliant. -Alert and following commands appropriately; no somnolence appreciated.         Family Communication:   son at bedside updated 10/3   Consultants:  renal, general surgery, ID   Code Status:  FULL  insulin aspart  0-5 Units Subcutaneous QHS   insulin aspart  0-6 Units Subcutaneous TID WC   insulin glargine-yfgn  4 Units Subcutaneous BID   labetalol  100 mg Oral BID   loratadine  10 mg Oral Daily   montelukast  10 mg Oral QHS   pantoprazole (PROTONIX) IV  40 mg Intravenous Q12H   polyethylene glycol  17 g Oral Daily   rOPINIRole  2 mg Oral QHS   rosuvastatin  20 mg Oral Daily   senna  2 tablet Oral Daily   sevelamer carbonate  1,600 mg Oral BID WC   sevelamer carbonate  2,400 mg Oral Q lunch   Continuous Infusions:  anticoagulant sodium citrate      ceFAZolin (ANCEF) IV 1 g (09/20/23 1704)    Procedures/Studies: DG Knee 1-2 Views Right  Result Date: 09/18/2023 CLINICAL DATA:  Right knee pain.   No known injury. EXAM: RIGHT KNEE - 1-2 VIEW COMPARISON:  None Available. FINDINGS: There is diffuse decreased bone mineralization. No joint effusion. Joint spaces are preserved. No acute fracture or dislocation. High-grade atherosclerotic calcifications. IMPRESSION: 1. No significant osteoarthritis. 2. No acute fracture or dislocation. 3. High-grade atherosclerotic calcifications. Electronically Signed   By: Neita Garnet M.D.   On: 09/18/2023 17:59   ECHO TEE  Result Date: 09/18/2023    TRANSESOPHOGEAL ECHO REPORT   Patient Name:   Patricia [name] Date of Exam: 09/18/2023 Medical Rec #:  [id number]   Height:       63.0 in Accession #:    [id number]  Weight:       247.1 lb Date of Birth:  July 20, 1971  BSA:          2.116 m Patient Age:    52 years    BP:           165/81 mmHg Patient Gender: F           HR:           91 bpm. Exam Location:  [name] Procedure: Transesophageal Echo, Cardiac Doppler and Color Doppler Indications:    Bacteremia R78.81  History:        Patient has prior history of Echocardiogram examinations, most                 recent 09/15/2023. CHF; Risk Factors:Hypertension, Diabetes and                 Dyslipidemia. ESRD on dialysis [facility]), Breast cancer (HCC), OSA                 (obstructive sleep apnea), Covid-19.  Sonographer:    [name] RCS Referring Phys: [id number] [name] PROCEDURE: After discussion of the risks and benefits of a TEE, an informed consent was obtained from the patient. TEE procedure time was 7 minutes. The transesophogeal probe was passed without difficulty through the esophogus of the patient. Imaged were  obtained with the patient in a left lateral decubitus position. Local oropharyngeal anesthetic was provided with viscous lidocaine. Sedation performed by different physician. The patient was monitored while under deep sedation. Image quality was good. The patient's vital signs; including heart rate, blood pressure, and oxygen saturation; remained stable  throughout the procedure. The patient developed no complications during the procedure.  IMPRESSIONS  1. Left ventricular ejection fraction, by estimation, is 60 to 65%. The left ventricle has normal function. The left ventricle has no regional wall motion abnormalities. There is mild concentric left ventricular hypertrophy.  insulin aspart  0-5 Units Subcutaneous QHS   insulin aspart  0-6 Units Subcutaneous TID WC   insulin glargine-yfgn  4 Units Subcutaneous BID   labetalol  100 mg Oral BID   loratadine  10 mg Oral Daily   montelukast  10 mg Oral QHS   pantoprazole (PROTONIX) IV  40 mg Intravenous Q12H   polyethylene glycol  17 g Oral Daily   rOPINIRole  2 mg Oral QHS   rosuvastatin  20 mg Oral Daily   senna  2 tablet Oral Daily   sevelamer carbonate  1,600 mg Oral BID WC   sevelamer carbonate  2,400 mg Oral Q lunch   Continuous Infusions:  anticoagulant sodium citrate      ceFAZolin (ANCEF) IV 1 g (09/20/23 1704)    Procedures/Studies: DG Knee 1-2 Views Right  Result Date: 09/18/2023 CLINICAL DATA:  Right knee pain.   No known injury. EXAM: RIGHT KNEE - 1-2 VIEW COMPARISON:  None Available. FINDINGS: There is diffuse decreased bone mineralization. No joint effusion. Joint spaces are preserved. No acute fracture or dislocation. High-grade atherosclerotic calcifications. IMPRESSION: 1. No significant osteoarthritis. 2. No acute fracture or dislocation. 3. High-grade atherosclerotic calcifications. Electronically Signed   By: Neita Garnet M.D.   On: 09/18/2023 17:59   ECHO TEE  Result Date: 09/18/2023    TRANSESOPHOGEAL ECHO REPORT   Patient Name:   Patricia [name] Date of Exam: 09/18/2023 Medical Rec #:  [id number]   Height:       63.0 in Accession #:    [id number]  Weight:       247.1 lb Date of Birth:  July 20, 1971  BSA:          2.116 m Patient Age:    52 years    BP:           165/81 mmHg Patient Gender: F           HR:           91 bpm. Exam Location:  [name] Procedure: Transesophageal Echo, Cardiac Doppler and Color Doppler Indications:    Bacteremia R78.81  History:        Patient has prior history of Echocardiogram examinations, most                 recent 09/15/2023. CHF; Risk Factors:Hypertension, Diabetes and                 Dyslipidemia. ESRD on dialysis [facility]), Breast cancer (HCC), OSA                 (obstructive sleep apnea), Covid-19.  Sonographer:    [name] RCS Referring Phys: [id number] [name] PROCEDURE: After discussion of the risks and benefits of a TEE, an informed consent was obtained from the patient. TEE procedure time was 7 minutes. The transesophogeal probe was passed without difficulty through the esophogus of the patient. Imaged were  obtained with the patient in a left lateral decubitus position. Local oropharyngeal anesthetic was provided with viscous lidocaine. Sedation performed by different physician. The patient was monitored while under deep sedation. Image quality was good. The patient's vital signs; including heart rate, blood pressure, and oxygen saturation; remained stable  throughout the procedure. The patient developed no complications during the procedure.  IMPRESSIONS  1. Left ventricular ejection fraction, by estimation, is 60 to 65%. The left ventricle has normal function. The left ventricle has no regional wall motion abnormalities. There is mild concentric left ventricular hypertrophy.  insulin aspart  0-5 Units Subcutaneous QHS   insulin aspart  0-6 Units Subcutaneous TID WC   insulin glargine-yfgn  4 Units Subcutaneous BID   labetalol  100 mg Oral BID   loratadine  10 mg Oral Daily   montelukast  10 mg Oral QHS   pantoprazole (PROTONIX) IV  40 mg Intravenous Q12H   polyethylene glycol  17 g Oral Daily   rOPINIRole  2 mg Oral QHS   rosuvastatin  20 mg Oral Daily   senna  2 tablet Oral Daily   sevelamer carbonate  1,600 mg Oral BID WC   sevelamer carbonate  2,400 mg Oral Q lunch   Continuous Infusions:  anticoagulant sodium citrate      ceFAZolin (ANCEF) IV 1 g (09/20/23 1704)    Procedures/Studies: DG Knee 1-2 Views Right  Result Date: 09/18/2023 CLINICAL DATA:  Right knee pain.   No known injury. EXAM: RIGHT KNEE - 1-2 VIEW COMPARISON:  None Available. FINDINGS: There is diffuse decreased bone mineralization. No joint effusion. Joint spaces are preserved. No acute fracture or dislocation. High-grade atherosclerotic calcifications. IMPRESSION: 1. No significant osteoarthritis. 2. No acute fracture or dislocation. 3. High-grade atherosclerotic calcifications. Electronically Signed   By: Neita Garnet M.D.   On: 09/18/2023 17:59   ECHO TEE  Result Date: 09/18/2023    TRANSESOPHOGEAL ECHO REPORT   Patient Name:   Patricia [name] Date of Exam: 09/18/2023 Medical Rec #:  [id number]   Height:       63.0 in Accession #:    [id number]  Weight:       247.1 lb Date of Birth:  July 20, 1971  BSA:          2.116 m Patient Age:    52 years    BP:           165/81 mmHg Patient Gender: F           HR:           91 bpm. Exam Location:  [name] Procedure: Transesophageal Echo, Cardiac Doppler and Color Doppler Indications:    Bacteremia R78.81  History:        Patient has prior history of Echocardiogram examinations, most                 recent 09/15/2023. CHF; Risk Factors:Hypertension, Diabetes and                 Dyslipidemia. ESRD on dialysis [facility]), Breast cancer (HCC), OSA                 (obstructive sleep apnea), Covid-19.  Sonographer:    [name] RCS Referring Phys: [id number] [name] PROCEDURE: After discussion of the risks and benefits of a TEE, an informed consent was obtained from the patient. TEE procedure time was 7 minutes. The transesophogeal probe was passed without difficulty through the esophogus of the patient. Imaged were  obtained with the patient in a left lateral decubitus position. Local oropharyngeal anesthetic was provided with viscous lidocaine. Sedation performed by different physician. The patient was monitored while under deep sedation. Image quality was good. The patient's vital signs; including heart rate, blood pressure, and oxygen saturation; remained stable  throughout the procedure. The patient developed no complications during the procedure.  IMPRESSIONS  1. Left ventricular ejection fraction, by estimation, is 60 to 65%. The left ventricle has normal function. The left ventricle has no regional wall motion abnormalities. There is mild concentric left ventricular hypertrophy.  insulin aspart  0-5 Units Subcutaneous QHS   insulin aspart  0-6 Units Subcutaneous TID WC   insulin glargine-yfgn  4 Units Subcutaneous BID   labetalol  100 mg Oral BID   loratadine  10 mg Oral Daily   montelukast  10 mg Oral QHS   pantoprazole (PROTONIX) IV  40 mg Intravenous Q12H   polyethylene glycol  17 g Oral Daily   rOPINIRole  2 mg Oral QHS   rosuvastatin  20 mg Oral Daily   senna  2 tablet Oral Daily   sevelamer carbonate  1,600 mg Oral BID WC   sevelamer carbonate  2,400 mg Oral Q lunch   Continuous Infusions:  anticoagulant sodium citrate      ceFAZolin (ANCEF) IV 1 g (09/20/23 1704)    Procedures/Studies: DG Knee 1-2 Views Right  Result Date: 09/18/2023 CLINICAL DATA:  Right knee pain.   No known injury. EXAM: RIGHT KNEE - 1-2 VIEW COMPARISON:  None Available. FINDINGS: There is diffuse decreased bone mineralization. No joint effusion. Joint spaces are preserved. No acute fracture or dislocation. High-grade atherosclerotic calcifications. IMPRESSION: 1. No significant osteoarthritis. 2. No acute fracture or dislocation. 3. High-grade atherosclerotic calcifications. Electronically Signed   By: Neita Garnet M.D.   On: 09/18/2023 17:59   ECHO TEE  Result Date: 09/18/2023    TRANSESOPHOGEAL ECHO REPORT   Patient Name:   Patricia [name] Date of Exam: 09/18/2023 Medical Rec #:  [id number]   Height:       63.0 in Accession #:    [id number]  Weight:       247.1 lb Date of Birth:  July 20, 1971  BSA:          2.116 m Patient Age:    52 years    BP:           165/81 mmHg Patient Gender: F           HR:           91 bpm. Exam Location:  [name] Procedure: Transesophageal Echo, Cardiac Doppler and Color Doppler Indications:    Bacteremia R78.81  History:        Patient has prior history of Echocardiogram examinations, most                 recent 09/15/2023. CHF; Risk Factors:Hypertension, Diabetes and                 Dyslipidemia. ESRD on dialysis [facility]), Breast cancer (HCC), OSA                 (obstructive sleep apnea), Covid-19.  Sonographer:    [name] RCS Referring Phys: [id number] [name] PROCEDURE: After discussion of the risks and benefits of a TEE, an informed consent was obtained from the patient. TEE procedure time was 7 minutes. The transesophogeal probe was passed without difficulty through the esophogus of the patient. Imaged were  obtained with the patient in a left lateral decubitus position. Local oropharyngeal anesthetic was provided with viscous lidocaine. Sedation performed by different physician. The patient was monitored while under deep sedation. Image quality was good. The patient's vital signs; including heart rate, blood pressure, and oxygen saturation; remained stable  throughout the procedure. The patient developed no complications during the procedure.  IMPRESSIONS  1. Left ventricular ejection fraction, by estimation, is 60 to 65%. The left ventricle has normal function. The left ventricle has no regional wall motion abnormalities. There is mild concentric left ventricular hypertrophy.  insulin aspart  0-5 Units Subcutaneous QHS   insulin aspart  0-6 Units Subcutaneous TID WC   insulin glargine-yfgn  4 Units Subcutaneous BID   labetalol  100 mg Oral BID   loratadine  10 mg Oral Daily   montelukast  10 mg Oral QHS   pantoprazole (PROTONIX) IV  40 mg Intravenous Q12H   polyethylene glycol  17 g Oral Daily   rOPINIRole  2 mg Oral QHS   rosuvastatin  20 mg Oral Daily   senna  2 tablet Oral Daily   sevelamer carbonate  1,600 mg Oral BID WC   sevelamer carbonate  2,400 mg Oral Q lunch   Continuous Infusions:  anticoagulant sodium citrate      ceFAZolin (ANCEF) IV 1 g (09/20/23 1704)    Procedures/Studies: DG Knee 1-2 Views Right  Result Date: 09/18/2023 CLINICAL DATA:  Right knee pain.   No known injury. EXAM: RIGHT KNEE - 1-2 VIEW COMPARISON:  None Available. FINDINGS: There is diffuse decreased bone mineralization. No joint effusion. Joint spaces are preserved. No acute fracture or dislocation. High-grade atherosclerotic calcifications. IMPRESSION: 1. No significant osteoarthritis. 2. No acute fracture or dislocation. 3. High-grade atherosclerotic calcifications. Electronically Signed   By: Neita Garnet M.D.   On: 09/18/2023 17:59   ECHO TEE  Result Date: 09/18/2023    TRANSESOPHOGEAL ECHO REPORT   Patient Name:   Patricia [name] Date of Exam: 09/18/2023 Medical Rec #:  [id number]   Height:       63.0 in Accession #:    [id number]  Weight:       247.1 lb Date of Birth:  July 20, 1971  BSA:          2.116 m Patient Age:    52 years    BP:           165/81 mmHg Patient Gender: F           HR:           91 bpm. Exam Location:  [name] Procedure: Transesophageal Echo, Cardiac Doppler and Color Doppler Indications:    Bacteremia R78.81  History:        Patient has prior history of Echocardiogram examinations, most                 recent 09/15/2023. CHF; Risk Factors:Hypertension, Diabetes and                 Dyslipidemia. ESRD on dialysis [facility]), Breast cancer (HCC), OSA                 (obstructive sleep apnea), Covid-19.  Sonographer:    [name] RCS Referring Phys: [id number] [name] PROCEDURE: After discussion of the risks and benefits of a TEE, an informed consent was obtained from the patient. TEE procedure time was 7 minutes. The transesophogeal probe was passed without difficulty through the esophogus of the patient. Imaged were  obtained with the patient in a left lateral decubitus position. Local oropharyngeal anesthetic was provided with viscous lidocaine. Sedation performed by different physician. The patient was monitored while under deep sedation. Image quality was good. The patient's vital signs; including heart rate, blood pressure, and oxygen saturation; remained stable  throughout the procedure. The patient developed no complications during the procedure.  IMPRESSIONS  1. Left ventricular ejection fraction, by estimation, is 60 to 65%. The left ventricle has normal function. The left ventricle has no regional wall motion abnormalities. There is mild concentric left ventricular hypertrophy.  2. Right ventricular systolic function is normal. The right ventricular size is normal.  3. Left atrial size was mildly dilated. No left atrial/left atrial appendage thrombus was detected. The LAA emptying velocity was 70 cm/s.  4. Right atrial size was mildly dilated.  5. The mitral valve is grossly normal. Trivial mitral valve regurgitation.  6. The aortic valve is tricuspid. There is mild calcification of the aortic valve. Aortic valve regurgitation is not visualized. Aortic valve sclerosis/calcification is present, without any evidence of aortic stenosis.  7. There is Moderate (Grade III) plaque involving the descending aorta. Conclusion(s)/Recommendation(s): No valvular vegetations. FINDINGS  Left Ventricle: Left ventricular ejection fraction, by estimation, is 60 to 65%. The left ventricle has normal function. The left ventricle has no regional wall motion abnormalities. The left ventricular internal cavity size was normal in size. There is  mild concentric left ventricular hypertrophy. Right Ventricle: The right ventricular size is normal. Right vetricular wall thickness was not well visualized. Right ventricular systolic function is normal. Left Atrium: Left atrial size was mildly dilated. No left atrial/left atrial appendage thrombus was detected. The LAA emptying velocity was 70 cm/s. Right Atrium: Right atrial size was mildly dilated. Pericardium: There is no evidence of pericardial effusion. Mitral Valve: The mitral valve is grossly normal. Trivial mitral valve regurgitation. Tricuspid Valve: The tricuspid valve is grossly normal. Tricuspid valve regurgitation is mild. Aortic Valve:  The aortic valve is tricuspid. There is mild calcification of the aortic valve. Aortic valve regurgitation is not visualized. Aortic valve sclerosis/calcification is present, without any evidence of aortic stenosis. Pulmonic Valve: The pulmonic valve was grossly normal. Pulmonic valve regurgitation is trivial. Aorta: The aortic root is normal in size and structure. There is moderate (Grade III) plaque involving the descending aorta. IAS/Shunts: No atrial level shunt detected by color flow Doppler. Additional Comments: Spectral Doppler performed. Nona Dell MD Electronically signed by Nona Dell MD Signature Date/Time: 09/18/2023/12:57:59 PM    Final    DG Chest Port 1 View  Result Date: 09/16/2023 CLINICAL DATA:  Encounter removal of vascular catheter. EXAM: PORTABLE CHEST 1 VIEW COMPARISON:  Radiograph 06/14/2023 FINDINGS: The previous left-sided dialysis catheter is been removed. No evidence of pneumothorax. Soft tissue attenuation from habitus limits detailed assessment. Cardiomegaly is stable. Suspected vascular congestion. No large pleural effusion. IMPRESSION: 1. The left-sided dialysis catheter has been removed. No evidence of pneumothorax. 2. Cardiomegaly with vascular congestion. Electronically Signed   By: Narda Rutherford M.D.   On: 09/16/2023 19:14   ECHOCARDIOGRAM COMPLETE  Result Date: 09/15/2023    ECHOCARDIOGRAM REPORT   Patient Name:   Patricia [name] Date of Exam: 09/15/2023 Medical Rec #:  [id number]   Height:       63.0 in Accession #:    [id number]  Weight:       245.0 lb Date of Birth:  Jan 17, 1971  BSA:          2.108 m Patient Age:    52 years    BP:           125132/4757 mmHg Patient Gender: F           HR:           93 bpm. Exam Location:  [name] Procedure: 2D Echo, Cardiac Doppler and Color Doppler Indications:    Fever R50.9  History:        Patient has prior history of Echocardiogram examinations, most  insulin aspart  0-5 Units Subcutaneous QHS   insulin aspart  0-6 Units Subcutaneous TID WC   insulin glargine-yfgn  4 Units Subcutaneous BID   labetalol  100 mg Oral BID   loratadine  10 mg Oral Daily   montelukast  10 mg Oral QHS   pantoprazole (PROTONIX) IV  40 mg Intravenous Q12H   polyethylene glycol  17 g Oral Daily   rOPINIRole  2 mg Oral QHS   rosuvastatin  20 mg Oral Daily   senna  2 tablet Oral Daily   sevelamer carbonate  1,600 mg Oral BID WC   sevelamer carbonate  2,400 mg Oral Q lunch   Continuous Infusions:  anticoagulant sodium citrate      ceFAZolin (ANCEF) IV 1 g (09/20/23 1704)    Procedures/Studies: DG Knee 1-2 Views Right  Result Date: 09/18/2023 CLINICAL DATA:  Right knee pain.   No known injury. EXAM: RIGHT KNEE - 1-2 VIEW COMPARISON:  None Available. FINDINGS: There is diffuse decreased bone mineralization. No joint effusion. Joint spaces are preserved. No acute fracture or dislocation. High-grade atherosclerotic calcifications. IMPRESSION: 1. No significant osteoarthritis. 2. No acute fracture or dislocation. 3. High-grade atherosclerotic calcifications. Electronically Signed   By: Neita Garnet M.D.   On: 09/18/2023 17:59   ECHO TEE  Result Date: 09/18/2023    TRANSESOPHOGEAL ECHO REPORT   Patient Name:   Patricia [name] Date of Exam: 09/18/2023 Medical Rec #:  [id number]   Height:       63.0 in Accession #:    [id number]  Weight:       247.1 lb Date of Birth:  July 20, 1971  BSA:          2.116 m Patient Age:    52 years    BP:           165/81 mmHg Patient Gender: F           HR:           91 bpm. Exam Location:  [name] Procedure: Transesophageal Echo, Cardiac Doppler and Color Doppler Indications:    Bacteremia R78.81  History:        Patient has prior history of Echocardiogram examinations, most                 recent 09/15/2023. CHF; Risk Factors:Hypertension, Diabetes and                 Dyslipidemia. ESRD on dialysis [facility]), Breast cancer (HCC), OSA                 (obstructive sleep apnea), Covid-19.  Sonographer:    [name] RCS Referring Phys: [id number] [name] PROCEDURE: After discussion of the risks and benefits of a TEE, an informed consent was obtained from the patient. TEE procedure time was 7 minutes. The transesophogeal probe was passed without difficulty through the esophogus of the patient. Imaged were  obtained with the patient in a left lateral decubitus position. Local oropharyngeal anesthetic was provided with viscous lidocaine. Sedation performed by different physician. The patient was monitored while under deep sedation. Image quality was good. The patient's vital signs; including heart rate, blood pressure, and oxygen saturation; remained stable  throughout the procedure. The patient developed no complications during the procedure.  IMPRESSIONS  1. Left ventricular ejection fraction, by estimation, is 60 to 65%. The left ventricle has normal function. The left ventricle has no regional wall motion abnormalities. There is mild concentric left ventricular hypertrophy.

## 2023-09-20 NOTE — Progress Notes (Signed)
[id number]   Height:       63.0 in Accession #:    [id number]  Weight:       247.1 lb Date of Birth:  Nov 13, 1971  BSA:          2.116 m Patient Age:    52 years    BP:           165/81 mmHg Patient Gender: F           HR:           91 bpm. Exam [facility] Procedure: Transesophageal Echo, Cardiac Doppler and Color Doppler Indications:    Bacteremia R78.81  History:        Patient has prior history of Echocardiogram examinations, most                 recent 09/15/2023. CHF; Risk Factors:Hypertension, Diabetes and                 Dyslipidemia. ESRD on dialysis [facility]), Breast cancer (HCC), OSA                 (obstructive sleep apnea), Covid-19.  Sonographer:    [name] RCS Referring Phys: [id number] [name] PROCEDURE: After discussion of the risks and benefits of a TEE, an informed consent was obtained from the patient. TEE procedure time was 7 minutes. The transesophogeal probe was passed without  difficulty through the esophogus of the patient. Imaged were  obtained with the patient in a left lateral decubitus position. Local oropharyngeal anesthetic was provided with viscous lidocaine. Sedation performed by different physician. The patient was monitored while under deep sedation. Image quality was good. The patient's vital signs; including heart rate, blood pressure, and oxygen saturation; remained stable throughout the procedure. The patient developed no complications during the procedure.  IMPRESSIONS  1. Left ventricular ejection fraction, by estimation, is 60 to 65%. The left ventricle has normal function. The left ventricle has no regional wall motion abnormalities. There is mild concentric left ventricular hypertrophy.  2. Right ventricular systolic function is normal. The right ventricular size is normal.  3. Left atrial size was mildly dilated. No left atrial/left atrial appendage thrombus was detected. The LAA emptying velocity was 70 cm/s.  4. Right atrial size was mildly dilated.  5. The mitral valve is grossly normal. Trivial mitral valve regurgitation.  6. The aortic valve is tricuspid. There is mild calcification of the aortic valve. Aortic valve regurgitation is not visualized. Aortic valve sclerosis/calcification is present, without any evidence of aortic stenosis.  7. There is Moderate (Grade III) plaque involving the descending aorta. Conclusion(s)/Recommendation(s): No valvular vegetations. FINDINGS  Left Ventricle: Left ventricular ejection fraction, by estimation, is 60 to 65%. The left ventricle has normal function. The left ventricle has no regional wall motion abnormalities. The left ventricular internal cavity size was normal in size. There is  mild concentric left ventricular hypertrophy. Right Ventricle: The right ventricular size is normal. Right vetricular wall thickness was not well visualized. Right ventricular systolic function is normal. Left Atrium: Left atrial size was  mildly dilated. No left atrial/left atrial appendage thrombus was detected. The LAA emptying velocity was 70 cm/s. Right Atrium: Right atrial size was mildly dilated. Pericardium: There is no evidence of pericardial effusion. Mitral Valve: The mitral valve is grossly normal. Trivial mitral valve regurgitation. Tricuspid Valve: The tricuspid valve is grossly normal. Tricuspid valve regurgitation is mild. Aortic Valve: The aortic valve is tricuspid. There is mild  Patient ID: Yumna Ebers, female   DOB: 1971-01-29, 52 y.o.   MRN: [id number] S: Feeling a little better today.  Tolerated HD yesterday with 3 L UF O:BP (!) 159/63   Pulse 90   Temp 98.7 F (37.1 C) (Oral)   Resp 17   Ht 5\' 3"  (1.6 m)   Wt 106 kg   SpO2 94%   BMI 41.40 kg/m   Intake/Output Summary (Last 24 hours) at 09/20/2023 0932 Last data filed at 09/20/2023 0600 Gross per 24 hour  Intake 170 ml  Output 3100 ml  Net -2930 ml   Intake/Output: I/O last 3 completed shifts: In: 170 [P.O.:120; IV Piggyback:50] Out: 3300 [Emesis/NG output:200; Other:3100]  Intake/Output this shift:  No intake/output data recorded. Weight change: -3.2 kg Gen: NAD CVS: RRR Resp: CTA Abd: +BS, soft, NT/ND Ext: no edema, LUE AVF +T/B, left femoral HD catheter in place.   Recent Labs  Lab 09/14/23 1716 09/15/23 0611 09/16/23 1319 09/18/23 0856 09/19/23 1640  NA 129* 129* 131* 131* 132*  K 3.6 4.2 4.2 4.1 3.9  CL 90* 89* 90* 90* 88*  CO2 20* 20* 17* 21* 22  GLUCOSE 257* 215* 188* 235* 367*  BUN 44* 57* 88* 79* 98*  CREATININE 10.30* 11.65* 14.29* 11.94* 15.60*  ALBUMIN 3.8  --  3.1*  --  3.2*  CALCIUM 9.0 9.1 9.2 9.1 9.6  PHOS  --   --  7.6*  --  6.5*  AST 17  --   --   --   --   ALT 13  --   --   --   --    Liver Function Tests: Recent Labs  Lab 09/14/23 1716 09/16/23 1319 09/19/23 1640  AST 17  --   --   ALT 13  --   --   ALKPHOS 88  --   --   BILITOT 0.4  --   --   PROT 7.7  --   --   ALBUMIN 3.8 3.1* 3.2*   No results for input(s): "LIPASE", "AMYLASE" in the last 168 hours. No results for input(s): "AMMONIA" in the last 168 hours. CBC: Recent Labs  Lab 09/14/23 1716 09/15/23 0611 09/16/23 1319 09/18/23 1622 09/19/23 1640  WBC 22.0* 22.1* 13.1* 15.0* 15.9*  NEUTROABS 19.7*  --   --   --   --   HGB 8.2* 9.9* 8.8* 8.7* 8.3*  HCT 26.6* 32.2* 27.5* 28.1* 27.3*  MCV 92.7 92.5 89.0 90.6 91.0  PLT 187 162 176 231 240   Cardiac Enzymes: No results for input(s):  "CKTOTAL", "CKMB", "CKMBINDEX", "TROPONINI" in the last 168 hours. CBG: Recent Labs  Lab 09/19/23 0736 09/19/23 1150 09/19/23 1606 09/19/23 2134 09/20/23 0753  GLUCAP 205* 192* 316* 185* 325*    Iron Studies: No results for input(s): "IRON", "TIBC", "TRANSFERRIN", "FERRITIN" in the last 72 hours. Studies/Results: DG Knee 1-2 Views Right  Result Date: 09/18/2023 CLINICAL DATA:  Right knee pain.  No known injury. EXAM: RIGHT KNEE - 1-2 VIEW COMPARISON:  None Available. FINDINGS: There is diffuse decreased bone mineralization. No joint effusion. Joint spaces are preserved. No acute fracture or dislocation. High-grade atherosclerotic calcifications. IMPRESSION: 1. No significant osteoarthritis. 2. No acute fracture or dislocation. 3. High-grade atherosclerotic calcifications. Electronically Signed   By: Neita Garnet M.D.   On: 09/18/2023 17:59   ECHO TEE  Result Date: 09/18/2023    TRANSESOPHOGEAL ECHO REPORT   Patient Name:   [facility] Date of Exam: 09/18/2023 Medical Rec #:  [id number]   Height:       63.0 in Accession #:    [id number]  Weight:       247.1 lb Date of Birth:  Nov 13, 1971  BSA:          2.116 m Patient Age:    52 years    BP:           165/81 mmHg Patient Gender: F           HR:           91 bpm. [facility] Procedure: Transesophageal Echo, Cardiac Doppler and Color Doppler Indications:    Bacteremia R78.81  History:        Patient has prior history of Echocardiogram examinations, most                 recent 09/15/2023. CHF; Risk Factors:Hypertension, Diabetes and                 Dyslipidemia. ESRD on dialysis [facility]), Breast cancer (HCC), OSA                 (obstructive sleep apnea), Covid-19.  Sonographer:    [name] RCS Referring Phys: [id number] [name] PROCEDURE: After discussion of the risks and benefits of a TEE, an informed consent was obtained from the patient. TEE procedure time was 7 minutes. The transesophogeal probe was passed without  difficulty through the esophogus of the patient. Imaged were  obtained with the patient in a left lateral decubitus position. Local oropharyngeal anesthetic was provided with viscous lidocaine. Sedation performed by different physician. The patient was monitored while under deep sedation. Image quality was good. The patient's vital signs; including heart rate, blood pressure, and oxygen saturation; remained stable throughout the procedure. The patient developed no complications during the procedure.  IMPRESSIONS  1. Left ventricular ejection fraction, by estimation, is 60 to 65%. The left ventricle has normal function. The left ventricle has no regional wall motion abnormalities. There is mild concentric left ventricular hypertrophy.  2. Right ventricular systolic function is normal. The right ventricular size is normal.  3. Left atrial size was mildly dilated. No left atrial/left atrial appendage thrombus was detected. The LAA emptying velocity was 70 cm/s.  4. Right atrial size was mildly dilated.  5. The mitral valve is grossly normal. Trivial mitral valve regurgitation.  6. The aortic valve is tricuspid. There is mild calcification of the aortic valve. Aortic valve regurgitation is not visualized. Aortic valve sclerosis/calcification is present, without any evidence of aortic stenosis.  7. There is Moderate (Grade III) plaque involving the descending aorta. Conclusion(s)/Recommendation(s): No valvular vegetations. FINDINGS  Left Ventricle: Left ventricular ejection fraction, by estimation, is 60 to 65%. The left ventricle has normal function. The left ventricle has no regional wall motion abnormalities. The left ventricular internal cavity size was normal in size. There is  mild concentric left ventricular hypertrophy. Right Ventricle: The right ventricular size is normal. Right vetricular wall thickness was not well visualized. Right ventricular systolic function is normal. Left Atrium: Left atrial size was  mildly dilated. No left atrial/left atrial appendage thrombus was detected. The LAA emptying velocity was 70 cm/s. Right Atrium: Right atrial size was mildly dilated. Pericardium: There is no evidence of pericardial effusion. Mitral Valve: The mitral valve is grossly normal. Trivial mitral valve regurgitation. Tricuspid Valve: The tricuspid valve is grossly normal. Tricuspid valve regurgitation is mild. Aortic Valve: The aortic valve is tricuspid. There is mild  Patient ID: Yumna Ebers, female   DOB: 1971-01-29, 52 y.o.   MRN: [id number] S: Feeling a little better today.  Tolerated HD yesterday with 3 L UF O:BP (!) 159/63   Pulse 90   Temp 98.7 F (37.1 C) (Oral)   Resp 17   Ht 5\' 3"  (1.6 m)   Wt 106 kg   SpO2 94%   BMI 41.40 kg/m   Intake/Output Summary (Last 24 hours) at 09/20/2023 0932 Last data filed at 09/20/2023 0600 Gross per 24 hour  Intake 170 ml  Output 3100 ml  Net -2930 ml   Intake/Output: I/O last 3 completed shifts: In: 170 [P.O.:120; IV Piggyback:50] Out: 3300 [Emesis/NG output:200; Other:3100]  Intake/Output this shift:  No intake/output data recorded. Weight change: -3.2 kg Gen: NAD CVS: RRR Resp: CTA Abd: +BS, soft, NT/ND Ext: no edema, LUE AVF +T/B, left femoral HD catheter in place.   Recent Labs  Lab 09/14/23 1716 09/15/23 0611 09/16/23 1319 09/18/23 0856 09/19/23 1640  NA 129* 129* 131* 131* 132*  K 3.6 4.2 4.2 4.1 3.9  CL 90* 89* 90* 90* 88*  CO2 20* 20* 17* 21* 22  GLUCOSE 257* 215* 188* 235* 367*  BUN 44* 57* 88* 79* 98*  CREATININE 10.30* 11.65* 14.29* 11.94* 15.60*  ALBUMIN 3.8  --  3.1*  --  3.2*  CALCIUM 9.0 9.1 9.2 9.1 9.6  PHOS  --   --  7.6*  --  6.5*  AST 17  --   --   --   --   ALT 13  --   --   --   --    Liver Function Tests: Recent Labs  Lab 09/14/23 1716 09/16/23 1319 09/19/23 1640  AST 17  --   --   ALT 13  --   --   ALKPHOS 88  --   --   BILITOT 0.4  --   --   PROT 7.7  --   --   ALBUMIN 3.8 3.1* 3.2*   No results for input(s): "LIPASE", "AMYLASE" in the last 168 hours. No results for input(s): "AMMONIA" in the last 168 hours. CBC: Recent Labs  Lab 09/14/23 1716 09/15/23 0611 09/16/23 1319 09/18/23 1622 09/19/23 1640  WBC 22.0* 22.1* 13.1* 15.0* 15.9*  NEUTROABS 19.7*  --   --   --   --   HGB 8.2* 9.9* 8.8* 8.7* 8.3*  HCT 26.6* 32.2* 27.5* 28.1* 27.3*  MCV 92.7 92.5 89.0 90.6 91.0  PLT 187 162 176 231 240   Cardiac Enzymes: No results for input(s):  "CKTOTAL", "CKMB", "CKMBINDEX", "TROPONINI" in the last 168 hours. CBG: Recent Labs  Lab 09/19/23 0736 09/19/23 1150 09/19/23 1606 09/19/23 2134 09/20/23 0753  GLUCAP 205* 192* 316* 185* 325*    Iron Studies: No results for input(s): "IRON", "TIBC", "TRANSFERRIN", "FERRITIN" in the last 72 hours. Studies/Results: DG Knee 1-2 Views Right  Result Date: 09/18/2023 CLINICAL DATA:  Right knee pain.  No known injury. EXAM: RIGHT KNEE - 1-2 VIEW COMPARISON:  None Available. FINDINGS: There is diffuse decreased bone mineralization. No joint effusion. Joint spaces are preserved. No acute fracture or dislocation. High-grade atherosclerotic calcifications. IMPRESSION: 1. No significant osteoarthritis. 2. No acute fracture or dislocation. 3. High-grade atherosclerotic calcifications. Electronically Signed   By: Neita Garnet M.D.   On: 09/18/2023 17:59   ECHO TEE  Result Date: 09/18/2023    TRANSESOPHOGEAL ECHO REPORT   Patient Name:   [facility] Date of Exam: 09/18/2023 Medical Rec #:

## 2023-09-20 NOTE — Plan of Care (Signed)

## 2023-09-20 NOTE — Progress Notes (Signed)
ID Brief Note:  #MSSA bacteremia likely secondary to HD catheter - She underwent loopogram on 8/20//24 with vascular surgery for malfunctioning left Lacroce per extremity fistula.  Follow-up with [facility] on 9/17, had been getting HD through tunneled catheter left IJ.  She had low-grade fever with dialysis on 9/25.  Initially her left upper extremity fistula was used, due to discomfort HD catheter was used to complete treatment.  She felt better after acetaminophen.  Then developed sent home,, pain at left shoulder.To ED to had a temp of 103, leukocytosis.  Blood cultures grew MSSA. - Started on cefazolin and HD catheter was pulled. Recommendations: - MRI did not show osteo, f/u with ortho  as it did show meniscal abnormalities. - Line holiday x 72 hours(new line place don 10/3), HD catheter was removed on 9/30 at 1900.  Repeat blood cultures as cultures taken prior to line removal at 1846, 1851 - Continue cefazolin wit HD x 6 weeks from negative Cx on 10/3 EOT 11/13 -F/U with ID on 11/11  ID will sign off

## 2023-09-21 DIAGNOSIS — R6520 Severe sepsis without septic shock: Secondary | ICD-10-CM | POA: Diagnosis not present

## 2023-09-21 DIAGNOSIS — N186 End stage renal disease: Secondary | ICD-10-CM | POA: Diagnosis not present

## 2023-09-21 DIAGNOSIS — E66813 Obesity, class 3: Secondary | ICD-10-CM | POA: Diagnosis not present

## 2023-09-21 DIAGNOSIS — A419 Sepsis, unspecified organism: Secondary | ICD-10-CM | POA: Diagnosis not present

## 2023-09-21 LAB — GLUCOSE, CAPILLARY
Glucose-Capillary: 315 mg/dL — ABNORMAL HIGH (ref 70–99)
Glucose-Capillary: 341 mg/dL — ABNORMAL HIGH (ref 70–99)
Glucose-Capillary: 235 mg/dL — ABNORMAL HIGH (ref 70–99)
Glucose-Capillary: 287 mg/dL — ABNORMAL HIGH (ref 70–99)

## 2023-09-21 LAB — BASIC METABOLIC PANEL
Anion gap: 20 — ABNORMAL HIGH (ref 5–15)
BUN: 75 mg/dL — ABNORMAL HIGH (ref 6–20)
CO2: 22 mmol/L (ref 22–32)
Calcium: 10.3 mg/dL (ref 8.9–10.3)
Chloride: 90 mmol/L — ABNORMAL LOW (ref 98–111)
Creatinine, Ser: 12.96 mg/dL — ABNORMAL HIGH (ref 0.44–1.00)
GFR, Estimated: 3 mL/min — ABNORMAL LOW (ref >60)
Glucose, Bld: 326 mg/dL — ABNORMAL HIGH (ref 70–99)
Potassium: 4.3 mmol/L (ref 3.5–5.1)
Sodium: 132 mmol/L — ABNORMAL LOW (ref 135–145)

## 2023-09-21 LAB — RENAL FUNCTION PANEL
Albumin: 3.6 g/dL (ref 3.5–5.0)
Anion gap: 21 — ABNORMAL HIGH (ref 5–15)
BUN: 75 mg/dL — ABNORMAL HIGH (ref 6–20)
CO2: 21 mmol/L — ABNORMAL LOW (ref 22–32)
Calcium: 10.4 mg/dL — ABNORMAL HIGH (ref 8.9–10.3)
Chloride: 90 mmol/L — ABNORMAL LOW (ref 98–111)
Creatinine, Ser: 13 mg/dL — ABNORMAL HIGH (ref 0.44–1.00)
GFR, Estimated: 3 mL/min — ABNORMAL LOW (ref >60)
Glucose, Bld: 326 mg/dL — ABNORMAL HIGH (ref 70–99)
Phosphorus: 6.9 mg/dL — ABNORMAL HIGH (ref 2.5–4.6)
Potassium: 4.2 mmol/L (ref 3.5–5.1)
Sodium: 132 mmol/L — ABNORMAL LOW (ref 135–145)

## 2023-09-21 LAB — CULTURE, BLOOD (ROUTINE X 2)
Culture: NO GROWTH
Culture: NO GROWTH
Special Requests: ADEQUATE

## 2023-09-21 LAB — CBC
HCT: 29.2 % — ABNORMAL LOW (ref 36.0–46.0)
Hemoglobin: 9 g/dL — ABNORMAL LOW (ref 12.0–15.0)
MCH: 28 pg (ref 26.0–34.0)
MCHC: 30.8 g/dL (ref 30.0–36.0)
MCV: 91 fL (ref 80.0–100.0)
Platelets: 345 10*3/uL (ref 150–400)
RBC: 3.21 MIL/uL — ABNORMAL LOW (ref 3.87–5.11)
RDW: 14.6 % (ref 11.5–15.5)
WBC: 20.3 10*3/uL — ABNORMAL HIGH (ref 4.0–10.5)
nRBC: 0 % (ref 0.0–0.2)

## 2023-09-21 LAB — CREATININE, SERUM
Creatinine, Ser: 12.24 mg/dL — ABNORMAL HIGH (ref 0.44–1.00)
GFR, Estimated: 3 mL/min — ABNORMAL LOW (ref >60)

## 2023-09-21 MED ORDER — HEPARIN SODIUM (PORCINE) 1000 UNIT/ML IJ SOLN
INTRAMUSCULAR | Status: AC
  Filled 2023-09-21: qty 6

## 2023-09-21 MED ORDER — FLUCONAZOLE 100 MG PO TABS
100.0000 mg | ORAL_TABLET | Freq: Once | ORAL | Status: AC
  Administered 2023-09-21: 100 mg via ORAL
  Filled 2023-09-21: qty 1

## 2023-09-21 MED ORDER — ONDANSETRON HCL 4 MG/2ML IJ SOLN
INTRAMUSCULAR | Status: AC
  Filled 2023-09-21: qty 2

## 2023-09-21 MED ORDER — ONDANSETRON HCL 4 MG/2ML IJ SOLN
4.0000 mg | Freq: Four times a day (QID) | INTRAMUSCULAR | Status: DC
  Administered 2023-09-21 – 2023-09-24 (×8): 4 mg via INTRAVENOUS
  Filled 2023-09-21 (×10): qty 2

## 2023-09-21 NOTE — Progress Notes (Signed)
Pt has been up in the chair multiple times this shift. Pt also had dialysis today with one episode of n/v as reported by dialysis nurse. No c/o pain or discomfort at this time. Will continue to monitor.

## 2023-09-21 NOTE — Progress Notes (Signed)
   09/20/23 2135  Unmeasured Output  Emesis Occurrence 1   PRN Zofran for c/o N/V. PRN Dilaudid given for 7/10 pain. BP elevated, other vitals stable.

## 2023-09-21 NOTE — Evaluation (Signed)
Physical Therapy Evaluation Patient Details Name: Patricia [name] MRN: [id number] DOB: [birth year] Today's Date: 09/21/2023  History of Present Illness  Patricia [name] is a 52 y.o. female with medical history significant of ESRD on hemodialysis, (M,W,F), obesity class 3, T2DM, hypertension and asthma who presented with dyspnea, fevers and left neck pain.  Pt admitted due to fever and sepsis  Clinical Impression  Patricia [name] has noted decreased strength, decreased activity tolerance and decreased balance and will benefit from skilled PT.          If plan is discharge home, recommend the following: A little help with walking and/or transfers;A little help with bathing/dressing/bathroom;Assistance with cooking/housework;Help with stairs or ramp for entrance   Can travel by private vehicle    yes    Equipment Recommendations None recommended by PT  Recommendations for Other Services   OT   Functional Status Assessment Patient has had a recent decline in their functional status and demonstrates the ability to make significant improvements in function in a reasonable and predictable amount of time.     Precautions / Restrictions Precautions Precautions: Fall Restrictions Weight Bearing Restrictions: No      Mobility  Bed Mobility Overal bed mobility: Modified Independent             General bed mobility comments: Pt wants to raise head of bed up therapist requests not to states she will sue the hospital if she falls.  Therapist explains that she is at the side of the bed and will not fall.  Mod to max effort of pt to come supine to sit on her own.    Transfers Overall transfer level: Modified independent Equipment used: None                    Ambulation/Gait Ambulation/Gait assistance: Contact guard assist Gait Distance (Feet): 3 Feet   Gait Pattern/deviations: Decreased step length - right, Decreased step length - left   Gait velocity interpretation: <1.31 ft/sec,  indicative of household ambulator                Pertinent Vitals/Pain Pain Assessment Pain Assessment:  (more nausea than pain.  Pt states she has been sick all night [place])    Home Living  Apartment.                         Prior Function  Mod I normally uses walker when out of the house.                      Extremity/Trunk Assessment        Lower Extremity Assessment Lower Extremity Assessment: Generalized weakness       Communication   Communication Communication: No apparent difficulties Cueing Techniques: Verbal cues  Cognition     Overall Cognitive Status: Within Functional Limits for tasks assessed                                          General Comments      Exercises General Exercises - Lower Extremity Ankle Circles/Pumps: Both, 10 reps Quad Sets: Both, 10 reps, Supine Gluteal Sets: 10 reps, Supine (bridge) Short Arc Quad: Supine Speers Arc Quad: 5 reps, Seated Heel Slides: Supine, Both, 10 reps Hip ABduction/ADduction: Supine, Both, 10 reps   Assessment/Plan    PT Assessment Patient needs continued  PT services  PT Problem List Decreased strength;Decreased activity tolerance;Decreased balance       PT Treatment Interventions Therapeutic activities;Therapeutic exercise;Balance training    PT Goals (Current goals can be found in the Care Plan section)       Frequency Min 3X/week        AM-PAC PT "6 Clicks" Mobility  Outcome Measure Help needed turning from your back to your side while in a flat bed without using bedrails?: None Help needed moving from lying on your back to sitting on the side of a flat bed without using bedrails?: A Little Help needed moving to and from a bed to a chair (including a wheelchair)?: None Help needed standing up from a chair using your arms (e.g., wheelchair or bedside chair)?: None Help needed to walk in hospital room?: A Little Help needed climbing 3-5 steps with a  railing? : A Lot 6 Click Score: 20    End of Session   Activity Tolerance: Patient tolerated treatment well Patient left: in chair;with call bell/phone within reach   PT Visit Diagnosis: Unsteadiness on feet (R26.81);Muscle weakness (generalized) (M62.81)    Time: 0950-1020 PT Time Calculation (min) (ACUTE ONLY): 30 min   Charges:   PT Evaluation $PT Eval Low Complexity: 1 Low PT Treatments $Therapeutic Exercise: 8-22 mins PT General Charges $$ ACUTE PT VISIT: 1 Visit       Virgina Organ, PT CLT [phone]   09/21/2023, 10:30 AM

## 2023-09-21 NOTE — Plan of Care (Signed)
  Problem: Education: Goal: Understanding of CV disease, CV risk reduction, and recovery process will improve Outcome: Progressing   Problem: Education: Goal: Knowledge of cardiac device and self-care will improve Outcome: Progressing

## 2023-09-21 NOTE — Progress Notes (Addendum)
however no tear is seen extending through an articular surface of the medial meniscus. 2. Intrasubstance degeneration throughout the root of the anterior horn of the lateral meniscus. No tear is seen extending through an articular surface of the lateral meniscus. 3. Mild proximal anteromedial collateral ligament sprain with mild surrounding soft tissue edema. 4. Moderate thinning of the posterior non weight-bearing medial femoral condyle cartilage with multiple subchondral cysts. Of acute 5. No MRI evidence acute osteomyelitis. Electronically Signed   By: Patricia [name] M.D.   On: 09/20/2023 20:58   DG Knee 1-2 Views Right  Result Date: 09/18/2023 CLINICAL DATA:  Right knee pain.  No known injury. EXAM: RIGHT KNEE - 1-2 VIEW COMPARISON:  None Available. FINDINGS: There is diffuse decreased bone mineralization. No joint effusion. Joint spaces are preserved. No acute fracture or dislocation. High-grade atherosclerotic calcifications. IMPRESSION: 1. No significant osteoarthritis. 2. No acute fracture or dislocation. 3. High-grade atherosclerotic  calcifications. Electronically Signed   By: Patricia [name] M.D.   On: 09/18/2023 17:59   ECHO TEE  Result Date: 09/18/2023    TRANSESOPHOGEAL ECHO REPORT   Patient Name:   [name] Date of Exam: 09/18/2023 Medical Rec #:  [id number]   Height:       63.0 in Accession #:    [id number]  Weight:       247.1 lb Date of Birth:  1971/04/17  BSA:          2.116 m Patient Age:    51 years    BP:           165/81 mmHg Patient Gender: F           HR:           91 bpm. [facility] Procedure: Transesophageal Echo, Cardiac Doppler and Color Doppler Indications:    Bacteremia R78.81  History:        Patient has prior history of Echocardiogram examinations, most                 recent 09/15/2023. CHF; Risk Factors:Hypertension, Diabetes and                 Dyslipidemia. ESRD on dialysis [facility]), Breast cancer (HCC), OSA                 (obstructive sleep apnea), Covid-19.  Sonographer:    Patricia [name] RCS Referring Phys: [id number] [name] PROCEDURE: After discussion of the risks and benefits of a TEE, an informed consent was obtained from the patient. TEE procedure time was 7 minutes. The transesophogeal probe was passed without difficulty through the esophogus of the patient. Imaged were  obtained with the patient in a left lateral decubitus position. Local oropharyngeal anesthetic was provided with viscous lidocaine. Sedation performed by different physician. The patient was monitored while under deep sedation. Image quality was good. The patient's vital signs; including heart rate, blood pressure, and oxygen saturation; remained stable throughout the procedure. The patient developed no complications during the procedure.  IMPRESSIONS  1. Left ventricular ejection fraction, by estimation, is 60 to 65%. The left ventricle has normal function. The left ventricle has no regional wall motion abnormalities. There is mild concentric left ventricular hypertrophy.  2. Right ventricular systolic function is  normal. The right ventricular size is normal.  3. Left atrial size was mildly dilated. No left atrial/left atrial appendage thrombus was detected. The LAA emptying velocity was 70 cm/s.  4. Right atrial size was mildly  PROGRESS NOTE  Patricia [name] ZOX:[id number] DOB: 1971-06-02 DOA: 09/14/2023 PCP: Patricia Epley, PA-C  Brief History:  As per H&P written by Dr. Ella [name] on 09/14/2023 Patricia [name] is a 52 y.o. female with medical history significant of ESRD on hemodialysis, (M,W,F), obesity class 3, T2DM, hypertension and asthma who presented with dyspnea, fevers and left neck pain.    Patient has a left upper extremity fistula that has been malfunctioning, she underwent Fistulogram on 08.27.24 by Dr Patricia [name] and then had a follow up with with vascular surgery in [facility] 09/03/23, plan for further follow up with vascular ultrasound.  In the meanwhile she has been getting HD through a tunneled catheter in her left internal jugular vein.    Patient was noted to have a low grade fever on Wednesday, 09/25 during dialysis. She was treated with acetaminophen and completed HD. During that session, initially her left upper extremity fistula was used but she developed pain and discomfort, not able to complete HD by this route, then completed her treatment through her HD catheter.    The following day she felt a little better, but still not back to baseline, she continue having chills and generalized malaise.  Yesterday she had several episodes of vomiting prior to HD. She was medicated with ondansetron, acetaminophen and lomotil. She was able to complete her HD treatment through her HD catheter.    At home she continue to have chills and now developed pain in her left shoulder area, radiating into her head and left ear. No improving factors.    Assessment/Plan: * Severe sepsis secondary to MSSA bacteremia (HCC) -Severe sepsis with elevated leukocytosis, lactic acid, present on admission.  -Continue IV antibiotics--Ancef -Old Dialysis catheter has been removed 9/30 -10/2 TEE--no vegetations -TTE now demonstrating vegetations. -Stable vital signs. -9/30 repeat blood cultures>>neg x 5 days -09/19/23 blood  cultures--neg to date   Acute on chronic hypoxemic and hypercapnic respiratory failure. (Sepsis related).  -Continue supplemental 02  -chronically on 2L at home -currently on 4L -Continue CPAP nightly. -9/30 CXR - vascular congestion   ESRD on dialysis [facility]) -Patient chronically Monday, Wednesday and Friday for dialysis purposes -Last treatment on 09/16/2023 -Nephrology service consulted and will follow recommendations. -Patient's hemodialysis catheter successfully removed after dialysis treatment on 09/16/2023. -10/3--temporary HD catheter placed by gen surg--appreciate Dr. [name] -10/3 and 10/5--last HD; -hopefully can have tunneled HD cath done mon/tues  Nausea and vomiting -suspect gastroparesis -intolerant to reglan -around the clock antiemetics -pantoprazole bid   Right Knee pain -xray right knee--neg effusion, neg fx -consulted ortho--advised against arthrocentesis, no effusion noted on exam, knee was not red or warm to touch noted no low likelihood of septic joint  -MR knee--Mild proximal anteromedial collateral ligament sprain with mild surrounding soft tissue edema.  No osteomyelitis;  intrasubstance degeneration of medical and lateral menisci -overall improving with 90 degree flexion/extension with minimal pain   Essential hypertension -Currently stable -Follow blood pressure and adjust antihypertensive regimen as needed. -restarted normodyne, nifedipine   GERD (gastroesophageal reflux disease) -Continue with PPI  -Lifestyle changes discussed with patient.   Type 2 diabetes mellitus with hyperlipidemia (HCC) -Continue sliding scale insulin and follow CBGs fluctuation with further adjustment to hypoglycemic regimen as required. -increase semglee to 4 units BID   Class 3 obesity (HCC) -Body mass index is 43.78 kg/m. -Low-calorie diet, portion control and increase physical activity discussed with patient.   OSA -Continue CPAP nightly. -Patient instructed to be  however no tear is seen extending through an articular surface of the medial meniscus. 2. Intrasubstance degeneration throughout the root of the anterior horn of the lateral meniscus. No tear is seen extending through an articular surface of the lateral meniscus. 3. Mild proximal anteromedial collateral ligament sprain with mild surrounding soft tissue edema. 4. Moderate thinning of the posterior non weight-bearing medial femoral condyle cartilage with multiple subchondral cysts. Of acute 5. No MRI evidence acute osteomyelitis. Electronically Signed   By: Patricia [name] M.D.   On: 09/20/2023 20:58   DG Knee 1-2 Views Right  Result Date: 09/18/2023 CLINICAL DATA:  Right knee pain.  No known injury. EXAM: RIGHT KNEE - 1-2 VIEW COMPARISON:  None Available. FINDINGS: There is diffuse decreased bone mineralization. No joint effusion. Joint spaces are preserved. No acute fracture or dislocation. High-grade atherosclerotic calcifications. IMPRESSION: 1. No significant osteoarthritis. 2. No acute fracture or dislocation. 3. High-grade atherosclerotic  calcifications. Electronically Signed   By: Patricia [name] M.D.   On: 09/18/2023 17:59   ECHO TEE  Result Date: 09/18/2023    TRANSESOPHOGEAL ECHO REPORT   Patient Name:   [name] Date of Exam: 09/18/2023 Medical Rec #:  [id number]   Height:       63.0 in Accession #:    [id number]  Weight:       247.1 lb Date of Birth:  1971/04/17  BSA:          2.116 m Patient Age:    51 years    BP:           165/81 mmHg Patient Gender: F           HR:           91 bpm. [facility] Procedure: Transesophageal Echo, Cardiac Doppler and Color Doppler Indications:    Bacteremia R78.81  History:        Patient has prior history of Echocardiogram examinations, most                 recent 09/15/2023. CHF; Risk Factors:Hypertension, Diabetes and                 Dyslipidemia. ESRD on dialysis [facility]), Breast cancer (HCC), OSA                 (obstructive sleep apnea), Covid-19.  Sonographer:    Patricia [name] RCS Referring Phys: [id number] [name] PROCEDURE: After discussion of the risks and benefits of a TEE, an informed consent was obtained from the patient. TEE procedure time was 7 minutes. The transesophogeal probe was passed without difficulty through the esophogus of the patient. Imaged were  obtained with the patient in a left lateral decubitus position. Local oropharyngeal anesthetic was provided with viscous lidocaine. Sedation performed by different physician. The patient was monitored while under deep sedation. Image quality was good. The patient's vital signs; including heart rate, blood pressure, and oxygen saturation; remained stable throughout the procedure. The patient developed no complications during the procedure.  IMPRESSIONS  1. Left ventricular ejection fraction, by estimation, is 60 to 65%. The left ventricle has normal function. The left ventricle has no regional wall motion abnormalities. There is mild concentric left ventricular hypertrophy.  2. Right ventricular systolic function is  normal. The right ventricular size is normal.  3. Left atrial size was mildly dilated. No left atrial/left atrial appendage thrombus was detected. The LAA emptying velocity was 70 cm/s.  4. Right atrial size was mildly  dilated.  5. The mitral valve is grossly normal. Trivial mitral valve regurgitation.  6. The aortic valve is tricuspid. There is mild calcification of the aortic valve. Aortic valve regurgitation is not visualized. Aortic valve sclerosis/calcification is present, without any evidence of aortic stenosis.  7. There is Moderate (Grade III) plaque involving the descending aorta. Conclusion(s)/Recommendation(s): No valvular vegetations. FINDINGS  Left Ventricle: Left ventricular ejection fraction, by estimation, is 60 to 65%. The left ventricle has normal function. The left ventricle has no regional wall motion abnormalities. The left ventricular internal cavity size was normal in size. There is  mild concentric left ventricular hypertrophy. Right Ventricle: The right ventricular size is normal. Right vetricular wall thickness was not well visualized. Right ventricular systolic function is normal. Left Atrium: Left atrial size was mildly dilated. No left atrial/left atrial appendage thrombus was detected. The LAA emptying velocity was 70 cm/s. Right Atrium: Right atrial size was mildly dilated. Pericardium: There is no evidence of pericardial effusion. Mitral Valve: The mitral valve is grossly normal. Trivial mitral valve regurgitation. Tricuspid Valve: The tricuspid valve is grossly normal. Tricuspid valve regurgitation is mild. Aortic Valve: The aortic valve is tricuspid. There is mild calcification of the aortic valve. Aortic valve regurgitation is not visualized. Aortic valve sclerosis/calcification is present, without any evidence of aortic stenosis. Pulmonic Valve: The pulmonic valve was grossly normal. Pulmonic valve regurgitation is trivial. Aorta: The aortic root is normal in size and structure.  There is moderate (Grade III) plaque involving the descending aorta. IAS/Shunts: No atrial level shunt detected by color flow Doppler. Additional Comments: Spectral Doppler performed. Nona Dell MD Electronically signed by Nona Dell MD Signature Date/Time: 09/18/2023/12:57:59 PM    Final    DG Chest Port 1 View  Result Date: 09/16/2023 CLINICAL DATA:  Encounter removal of vascular catheter. EXAM: PORTABLE CHEST 1 VIEW COMPARISON:  Radiograph 06/14/2023 FINDINGS: The previous left-sided dialysis catheter is been removed. No evidence of pneumothorax. Soft tissue attenuation from habitus limits detailed assessment. Cardiomegaly is stable. Suspected vascular congestion. No large pleural effusion. IMPRESSION: 1. The left-sided dialysis catheter has been removed. No evidence of pneumothorax. 2. Cardiomegaly with vascular congestion. Electronically Signed   By: Narda Rutherford M.D.   On: 09/16/2023 19:14   ECHOCARDIOGRAM COMPLETE  Result Date: 09/15/2023    ECHOCARDIOGRAM REPORT   Patient Name:   [facility] Date of Exam: 09/15/2023 Medical Rec #:  [id number]   Height:       63.0 in Accession #:    [id number]  Weight:       245.0 lb Date of Birth:  [birth year]  BSA:          2.108 m Patient Age:    51 years    BP:           125132/4757 mmHg Patient Gender: F           HR:           93 bpm. [facility] Procedure: 2D Echo, Cardiac Doppler and Color Doppler Indications:    Fever R50.9  History:        Patient has prior history of Echocardiogram examinations, most                 recent 11/19/2022. CHF; Risk Factors:Hypertension, Diabetes and                 Dyslipidemia. ESRD on dialysis [facility]), Breast cancer (HCC), OSA                 (  dilated.  5. The mitral valve is grossly normal. Trivial mitral valve regurgitation.  6. The aortic valve is tricuspid. There is mild calcification of the aortic valve. Aortic valve regurgitation is not visualized. Aortic valve sclerosis/calcification is present, without any evidence of aortic stenosis.  7. There is Moderate (Grade III) plaque involving the descending aorta. Conclusion(s)/Recommendation(s): No valvular vegetations. FINDINGS  Left Ventricle: Left ventricular ejection fraction, by estimation, is 60 to 65%. The left ventricle has normal function. The left ventricle has no regional wall motion abnormalities. The left ventricular internal cavity size was normal in size. There is  mild concentric left ventricular hypertrophy. Right Ventricle: The right ventricular size is normal. Right vetricular wall thickness was not well visualized. Right ventricular systolic function is normal. Left Atrium: Left atrial size was mildly dilated. No left atrial/left atrial appendage thrombus was detected. The LAA emptying velocity was 70 cm/s. Right Atrium: Right atrial size was mildly dilated. Pericardium: There is no evidence of pericardial effusion. Mitral Valve: The mitral valve is grossly normal. Trivial mitral valve regurgitation. Tricuspid Valve: The tricuspid valve is grossly normal. Tricuspid valve regurgitation is mild. Aortic Valve: The aortic valve is tricuspid. There is mild calcification of the aortic valve. Aortic valve regurgitation is not visualized. Aortic valve sclerosis/calcification is present, without any evidence of aortic stenosis. Pulmonic Valve: The pulmonic valve was grossly normal. Pulmonic valve regurgitation is trivial. Aorta: The aortic root is normal in size and structure.  There is moderate (Grade III) plaque involving the descending aorta. IAS/Shunts: No atrial level shunt detected by color flow Doppler. Additional Comments: Spectral Doppler performed. Nona Dell MD Electronically signed by Nona Dell MD Signature Date/Time: 09/18/2023/12:57:59 PM    Final    DG Chest Port 1 View  Result Date: 09/16/2023 CLINICAL DATA:  Encounter removal of vascular catheter. EXAM: PORTABLE CHEST 1 VIEW COMPARISON:  Radiograph 06/14/2023 FINDINGS: The previous left-sided dialysis catheter is been removed. No evidence of pneumothorax. Soft tissue attenuation from habitus limits detailed assessment. Cardiomegaly is stable. Suspected vascular congestion. No large pleural effusion. IMPRESSION: 1. The left-sided dialysis catheter has been removed. No evidence of pneumothorax. 2. Cardiomegaly with vascular congestion. Electronically Signed   By: Narda Rutherford M.D.   On: 09/16/2023 19:14   ECHOCARDIOGRAM COMPLETE  Result Date: 09/15/2023    ECHOCARDIOGRAM REPORT   Patient Name:   [facility] Date of Exam: 09/15/2023 Medical Rec #:  [id number]   Height:       63.0 in Accession #:    [id number]  Weight:       245.0 lb Date of Birth:  [birth year]  BSA:          2.108 m Patient Age:    51 years    BP:           125132/4757 mmHg Patient Gender: F           HR:           93 bpm. [facility] Procedure: 2D Echo, Cardiac Doppler and Color Doppler Indications:    Fever R50.9  History:        Patient has prior history of Echocardiogram examinations, most                 recent 11/19/2022. CHF; Risk Factors:Hypertension, Diabetes and                 Dyslipidemia. ESRD on dialysis [facility]), Breast cancer (HCC), OSA                 (  PROGRESS NOTE  Patricia [name] ZOX:[id number] DOB: 1971-06-02 DOA: 09/14/2023 PCP: Patricia Epley, PA-C  Brief History:  As per H&P written by Dr. Ella [name] on 09/14/2023 Patricia [name] is a 52 y.o. female with medical history significant of ESRD on hemodialysis, (M,W,F), obesity class 3, T2DM, hypertension and asthma who presented with dyspnea, fevers and left neck pain.    Patient has a left upper extremity fistula that has been malfunctioning, she underwent Fistulogram on 08.27.24 by Dr Patricia [name] and then had a follow up with with vascular surgery in [place] 09/03/23, plan for further follow up with vascular ultrasound.  In the meanwhile she has been getting HD through a tunneled catheter in her left internal jugular vein.    Patient was noted to have a low grade fever on Wednesday, 09/25 during dialysis. She was treated with acetaminophen and completed HD. During that session, initially her left upper extremity fistula was used but she developed pain and discomfort, not able to complete HD by this route, then completed her treatment through her HD catheter.    The following day she felt a little better, but still not back to baseline, she continue having chills and generalized malaise.  Yesterday she had several episodes of vomiting prior to HD. She was medicated with ondansetron, acetaminophen and lomotil. She was able to complete her HD treatment through her HD catheter.    At home she continue to have chills and now developed pain in her left shoulder area, radiating into her head and left ear. No improving factors.    Assessment/Plan: * Severe sepsis secondary to MSSA bacteremia (HCC) -Severe sepsis with elevated leukocytosis, lactic acid, present on admission.  -Continue IV antibiotics--Ancef -Old Dialysis catheter has been removed 9/30 -10/2 TEE--no vegetations -TTE now demonstrating vegetations. -Stable vital signs. -9/30 repeat blood cultures>>neg x 5 days -09/19/23 blood  cultures--neg to date   Acute on chronic hypoxemic and hypercapnic respiratory failure. (Sepsis related).  -Continue supplemental 02  -chronically on 2L at home -currently on 4L -Continue CPAP nightly. -9/30 CXR - vascular congestion   ESRD on dialysis [facility]) -Patient chronically Monday, Wednesday and Friday for dialysis purposes -Last treatment on 09/16/2023 -Nephrology service consulted and will follow recommendations. -Patient's hemodialysis catheter successfully removed after dialysis treatment on 09/16/2023. -10/3--temporary HD catheter placed by gen surg--appreciate Dr. [name] -10/3 and 10/5--last HD; -hopefully can have tunneled HD cath done mon/tues  Nausea and vomiting -suspect gastroparesis -intolerant to reglan -around the clock antiemetics -pantoprazole bid   Right Knee pain -xray right knee--neg effusion, neg fx -consulted ortho--advised against arthrocentesis, no effusion noted on exam, knee was not red or warm to touch noted no low likelihood of septic joint  -MR knee--Mild proximal anteromedial collateral ligament sprain with mild surrounding soft tissue edema.  No osteomyelitis;  intrasubstance degeneration of medical and lateral menisci -overall improving with 90 degree flexion/extension with minimal pain   Essential hypertension -Currently stable -Follow blood pressure and adjust antihypertensive regimen as needed. -restarted normodyne, nifedipine   GERD (gastroesophageal reflux disease) -Continue with PPI  -Lifestyle changes discussed with patient.   Type 2 diabetes mellitus with hyperlipidemia (HCC) -Continue sliding scale insulin and follow CBGs fluctuation with further adjustment to hypoglycemic regimen as required. -increase semglee to 4 units BID   Class 3 obesity (HCC) -Body mass index is 43.78 kg/m. -Low-calorie diet, portion control and increase physical activity discussed with patient.   OSA -Continue CPAP nightly. -Patient instructed to be  PROGRESS NOTE  Patricia [name] ZOX:[id number] DOB: 1971-06-02 DOA: 09/14/2023 PCP: Patricia Epley, PA-C  Brief History:  As per H&P written by Dr. Ella [name] on 09/14/2023 Patricia [name] is a 52 y.o. female with medical history significant of ESRD on hemodialysis, (M,W,F), obesity class 3, T2DM, hypertension and asthma who presented with dyspnea, fevers and left neck pain.    Patient has a left upper extremity fistula that has been malfunctioning, she underwent Fistulogram on 08.27.24 by Dr Patricia [name] and then had a follow up with with vascular surgery in [place] 09/03/23, plan for further follow up with vascular ultrasound.  In the meanwhile she has been getting HD through a tunneled catheter in her left internal jugular vein.    Patient was noted to have a low grade fever on Wednesday, 09/25 during dialysis. She was treated with acetaminophen and completed HD. During that session, initially her left upper extremity fistula was used but she developed pain and discomfort, not able to complete HD by this route, then completed her treatment through her HD catheter.    The following day she felt a little better, but still not back to baseline, she continue having chills and generalized malaise.  Yesterday she had several episodes of vomiting prior to HD. She was medicated with ondansetron, acetaminophen and lomotil. She was able to complete her HD treatment through her HD catheter.    At home she continue to have chills and now developed pain in her left shoulder area, radiating into her head and left ear. No improving factors.    Assessment/Plan: * Severe sepsis secondary to MSSA bacteremia (HCC) -Severe sepsis with elevated leukocytosis, lactic acid, present on admission.  -Continue IV antibiotics--Ancef -Old Dialysis catheter has been removed 9/30 -10/2 TEE--no vegetations -TTE now demonstrating vegetations. -Stable vital signs. -9/30 repeat blood cultures>>neg x 5 days -09/19/23 blood  cultures--neg to date   Acute on chronic hypoxemic and hypercapnic respiratory failure. (Sepsis related).  -Continue supplemental 02  -chronically on 2L at home -currently on 4L -Continue CPAP nightly. -9/30 CXR - vascular congestion   ESRD on dialysis [facility]) -Patient chronically Monday, Wednesday and Friday for dialysis purposes -Last treatment on 09/16/2023 -Nephrology service consulted and will follow recommendations. -Patient's hemodialysis catheter successfully removed after dialysis treatment on 09/16/2023. -10/3--temporary HD catheter placed by gen surg--appreciate Dr. [name] -10/3 and 10/5--last HD; -hopefully can have tunneled HD cath done mon/tues  Nausea and vomiting -suspect gastroparesis -intolerant to reglan -around the clock antiemetics -pantoprazole bid   Right Knee pain -xray right knee--neg effusion, neg fx -consulted ortho--advised against arthrocentesis, no effusion noted on exam, knee was not red or warm to touch noted no low likelihood of septic joint  -MR knee--Mild proximal anteromedial collateral ligament sprain with mild surrounding soft tissue edema.  No osteomyelitis;  intrasubstance degeneration of medical and lateral menisci -overall improving with 90 degree flexion/extension with minimal pain   Essential hypertension -Currently stable -Follow blood pressure and adjust antihypertensive regimen as needed. -restarted normodyne, nifedipine   GERD (gastroesophageal reflux disease) -Continue with PPI  -Lifestyle changes discussed with patient.   Type 2 diabetes mellitus with hyperlipidemia (HCC) -Continue sliding scale insulin and follow CBGs fluctuation with further adjustment to hypoglycemic regimen as required. -increase semglee to 4 units BID   Class 3 obesity (HCC) -Body mass index is 43.78 kg/m. -Low-calorie diet, portion control and increase physical activity discussed with patient.   OSA -Continue CPAP nightly. -Patient instructed to be  however no tear is seen extending through an articular surface of the medial meniscus. 2. Intrasubstance degeneration throughout the root of the anterior horn of the lateral meniscus. No tear is seen extending through an articular surface of the lateral meniscus. 3. Mild proximal anteromedial collateral ligament sprain with mild surrounding soft tissue edema. 4. Moderate thinning of the posterior non weight-bearing medial femoral condyle cartilage with multiple subchondral cysts. Of acute 5. No MRI evidence acute osteomyelitis. Electronically Signed   By: Patricia [name] M.D.   On: 09/20/2023 20:58   DG Knee 1-2 Views Right  Result Date: 09/18/2023 CLINICAL DATA:  Right knee pain.  No known injury. EXAM: RIGHT KNEE - 1-2 VIEW COMPARISON:  None Available. FINDINGS: There is diffuse decreased bone mineralization. No joint effusion. Joint spaces are preserved. No acute fracture or dislocation. High-grade atherosclerotic calcifications. IMPRESSION: 1. No significant osteoarthritis. 2. No acute fracture or dislocation. 3. High-grade atherosclerotic  calcifications. Electronically Signed   By: Patricia [name] M.D.   On: 09/18/2023 17:59   ECHO TEE  Result Date: 09/18/2023    TRANSESOPHOGEAL ECHO REPORT   Patient Name:   [name] Date of Exam: 09/18/2023 Medical Rec #:  [id number]   Height:       63.0 in Accession #:    [id number]  Weight:       247.1 lb Date of Birth:  1971/04/17  BSA:          2.116 m Patient Age:    51 years    BP:           165/81 mmHg Patient Gender: F           HR:           91 bpm. [facility] Procedure: Transesophageal Echo, Cardiac Doppler and Color Doppler Indications:    Bacteremia R78.81  History:        Patient has prior history of Echocardiogram examinations, most                 recent 09/15/2023. CHF; Risk Factors:Hypertension, Diabetes and                 Dyslipidemia. ESRD on dialysis [facility]), Breast cancer (HCC), OSA                 (obstructive sleep apnea), Covid-19.  Sonographer:    Patricia [name] RCS Referring Phys: [id number] [name] PROCEDURE: After discussion of the risks and benefits of a TEE, an informed consent was obtained from the patient. TEE procedure time was 7 minutes. The transesophogeal probe was passed without difficulty through the esophogus of the patient. Imaged were  obtained with the patient in a left lateral decubitus position. Local oropharyngeal anesthetic was provided with viscous lidocaine. Sedation performed by different physician. The patient was monitored while under deep sedation. Image quality was good. The patient's vital signs; including heart rate, blood pressure, and oxygen saturation; remained stable throughout the procedure. The patient developed no complications during the procedure.  IMPRESSIONS  1. Left ventricular ejection fraction, by estimation, is 60 to 65%. The left ventricle has normal function. The left ventricle has no regional wall motion abnormalities. There is mild concentric left ventricular hypertrophy.  2. Right ventricular systolic function is  normal. The right ventricular size is normal.  3. Left atrial size was mildly dilated. No left atrial/left atrial appendage thrombus was detected. The LAA emptying velocity was 70 cm/s.  4. Right atrial size was mildly  however no tear is seen extending through an articular surface of the medial meniscus. 2. Intrasubstance degeneration throughout the root of the anterior horn of the lateral meniscus. No tear is seen extending through an articular surface of the lateral meniscus. 3. Mild proximal anteromedial collateral ligament sprain with mild surrounding soft tissue edema. 4. Moderate thinning of the posterior non weight-bearing medial femoral condyle cartilage with multiple subchondral cysts. Of acute 5. No MRI evidence acute osteomyelitis. Electronically Signed   By: Patricia [name] M.D.   On: 09/20/2023 20:58   DG Knee 1-2 Views Right  Result Date: 09/18/2023 CLINICAL DATA:  Right knee pain.  No known injury. EXAM: RIGHT KNEE - 1-2 VIEW COMPARISON:  None Available. FINDINGS: There is diffuse decreased bone mineralization. No joint effusion. Joint spaces are preserved. No acute fracture or dislocation. High-grade atherosclerotic calcifications. IMPRESSION: 1. No significant osteoarthritis. 2. No acute fracture or dislocation. 3. High-grade atherosclerotic  calcifications. Electronically Signed   By: Patricia [name] M.D.   On: 09/18/2023 17:59   ECHO TEE  Result Date: 09/18/2023    TRANSESOPHOGEAL ECHO REPORT   Patient Name:   [name] Date of Exam: 09/18/2023 Medical Rec #:  [id number]   Height:       63.0 in Accession #:    [id number]  Weight:       247.1 lb Date of Birth:  1971/04/17  BSA:          2.116 m Patient Age:    51 years    BP:           165/81 mmHg Patient Gender: F           HR:           91 bpm. [facility] Procedure: Transesophageal Echo, Cardiac Doppler and Color Doppler Indications:    Bacteremia R78.81  History:        Patient has prior history of Echocardiogram examinations, most                 recent 09/15/2023. CHF; Risk Factors:Hypertension, Diabetes and                 Dyslipidemia. ESRD on dialysis [facility]), Breast cancer (HCC), OSA                 (obstructive sleep apnea), Covid-19.  Sonographer:    Patricia [name] RCS Referring Phys: [id number] [name] PROCEDURE: After discussion of the risks and benefits of a TEE, an informed consent was obtained from the patient. TEE procedure time was 7 minutes. The transesophogeal probe was passed without difficulty through the esophogus of the patient. Imaged were  obtained with the patient in a left lateral decubitus position. Local oropharyngeal anesthetic was provided with viscous lidocaine. Sedation performed by different physician. The patient was monitored while under deep sedation. Image quality was good. The patient's vital signs; including heart rate, blood pressure, and oxygen saturation; remained stable throughout the procedure. The patient developed no complications during the procedure.  IMPRESSIONS  1. Left ventricular ejection fraction, by estimation, is 60 to 65%. The left ventricle has normal function. The left ventricle has no regional wall motion abnormalities. There is mild concentric left ventricular hypertrophy.  2. Right ventricular systolic function is  normal. The right ventricular size is normal.  3. Left atrial size was mildly dilated. No left atrial/left atrial appendage thrombus was detected. The LAA emptying velocity was 70 cm/s.  4. Right atrial size was mildly  dilated.  5. The mitral valve is grossly normal. Trivial mitral valve regurgitation.  6. The aortic valve is tricuspid. There is mild calcification of the aortic valve. Aortic valve regurgitation is not visualized. Aortic valve sclerosis/calcification is present, without any evidence of aortic stenosis.  7. There is Moderate (Grade III) plaque involving the descending aorta. Conclusion(s)/Recommendation(s): No valvular vegetations. FINDINGS  Left Ventricle: Left ventricular ejection fraction, by estimation, is 60 to 65%. The left ventricle has normal function. The left ventricle has no regional wall motion abnormalities. The left ventricular internal cavity size was normal in size. There is  mild concentric left ventricular hypertrophy. Right Ventricle: The right ventricular size is normal. Right vetricular wall thickness was not well visualized. Right ventricular systolic function is normal. Left Atrium: Left atrial size was mildly dilated. No left atrial/left atrial appendage thrombus was detected. The LAA emptying velocity was 70 cm/s. Right Atrium: Right atrial size was mildly dilated. Pericardium: There is no evidence of pericardial effusion. Mitral Valve: The mitral valve is grossly normal. Trivial mitral valve regurgitation. Tricuspid Valve: The tricuspid valve is grossly normal. Tricuspid valve regurgitation is mild. Aortic Valve: The aortic valve is tricuspid. There is mild calcification of the aortic valve. Aortic valve regurgitation is not visualized. Aortic valve sclerosis/calcification is present, without any evidence of aortic stenosis. Pulmonic Valve: The pulmonic valve was grossly normal. Pulmonic valve regurgitation is trivial. Aorta: The aortic root is normal in size and structure.  There is moderate (Grade III) plaque involving the descending aorta. IAS/Shunts: No atrial level shunt detected by color flow Doppler. Additional Comments: Spectral Doppler performed. Nona Dell MD Electronically signed by Nona Dell MD Signature Date/Time: 09/18/2023/12:57:59 PM    Final    DG Chest Port 1 View  Result Date: 09/16/2023 CLINICAL DATA:  Encounter removal of vascular catheter. EXAM: PORTABLE CHEST 1 VIEW COMPARISON:  Radiograph 06/14/2023 FINDINGS: The previous left-sided dialysis catheter is been removed. No evidence of pneumothorax. Soft tissue attenuation from habitus limits detailed assessment. Cardiomegaly is stable. Suspected vascular congestion. No large pleural effusion. IMPRESSION: 1. The left-sided dialysis catheter has been removed. No evidence of pneumothorax. 2. Cardiomegaly with vascular congestion. Electronically Signed   By: Narda Rutherford M.D.   On: 09/16/2023 19:14   ECHOCARDIOGRAM COMPLETE  Result Date: 09/15/2023    ECHOCARDIOGRAM REPORT   Patient Name:   [facility] Date of Exam: 09/15/2023 Medical Rec #:  [id number]   Height:       63.0 in Accession #:    [id number]  Weight:       245.0 lb Date of Birth:  [birth year]  BSA:          2.108 m Patient Age:    51 years    BP:           125132/4757 mmHg Patient Gender: F           HR:           93 bpm. [facility] Procedure: 2D Echo, Cardiac Doppler and Color Doppler Indications:    Fever R50.9  History:        Patient has prior history of Echocardiogram examinations, most                 recent 11/19/2022. CHF; Risk Factors:Hypertension, Diabetes and                 Dyslipidemia. ESRD on dialysis [facility]), Breast cancer (HCC), OSA                 (  PROGRESS NOTE  Patricia [name] ZOX:[id number] DOB: 1971-06-02 DOA: 09/14/2023 PCP: Patricia Epley, PA-C  Brief History:  As per H&P written by Dr. Ella [name] on 09/14/2023 Patricia [name] is a 52 y.o. female with medical history significant of ESRD on hemodialysis, (M,W,F), obesity class 3, T2DM, hypertension and asthma who presented with dyspnea, fevers and left neck pain.    Patient has a left upper extremity fistula that has been malfunctioning, she underwent Fistulogram on 08.27.24 by Dr Patricia [name] and then had a follow up with with vascular surgery in [facility] 09/03/23, plan for further follow up with vascular ultrasound.  In the meanwhile she has been getting HD through a tunneled catheter in her left internal jugular vein.    Patient was noted to have a low grade fever on Wednesday, 09/25 during dialysis. She was treated with acetaminophen and completed HD. During that session, initially her left upper extremity fistula was used but she developed pain and discomfort, not able to complete HD by this route, then completed her treatment through her HD catheter.    The following day she felt a little better, but still not back to baseline, she continue having chills and generalized malaise.  Yesterday she had several episodes of vomiting prior to HD. She was medicated with ondansetron, acetaminophen and lomotil. She was able to complete her HD treatment through her HD catheter.    At home she continue to have chills and now developed pain in her left shoulder area, radiating into her head and left ear. No improving factors.    Assessment/Plan: * Severe sepsis secondary to MSSA bacteremia (HCC) -Severe sepsis with elevated leukocytosis, lactic acid, present on admission.  -Continue IV antibiotics--Ancef -Old Dialysis catheter has been removed 9/30 -10/2 TEE--no vegetations -TTE now demonstrating vegetations. -Stable vital signs. -9/30 repeat blood cultures>>neg x 5 days -09/19/23 blood  cultures--neg to date   Acute on chronic hypoxemic and hypercapnic respiratory failure. (Sepsis related).  -Continue supplemental 02  -chronically on 2L at home -currently on 4L -Continue CPAP nightly. -9/30 CXR - vascular congestion   ESRD on dialysis [facility]) -Patient chronically Monday, Wednesday and Friday for dialysis purposes -Last treatment on 09/16/2023 -Nephrology service consulted and will follow recommendations. -Patient's hemodialysis catheter successfully removed after dialysis treatment on 09/16/2023. -10/3--temporary HD catheter placed by gen surg--appreciate Dr. [name] -10/3 and 10/5--last HD; -hopefully can have tunneled HD cath done mon/tues  Nausea and vomiting -suspect gastroparesis -intolerant to reglan -around the clock antiemetics -pantoprazole bid   Right Knee pain -xray right knee--neg effusion, neg fx -consulted ortho--advised against arthrocentesis, no effusion noted on exam, knee was not red or warm to touch noted no low likelihood of septic joint  -MR knee--Mild proximal anteromedial collateral ligament sprain with mild surrounding soft tissue edema.  No osteomyelitis;  intrasubstance degeneration of medical and lateral menisci -overall improving with 90 degree flexion/extension with minimal pain   Essential hypertension -Currently stable -Follow blood pressure and adjust antihypertensive regimen as needed. -restarted normodyne, nifedipine   GERD (gastroesophageal reflux disease) -Continue with PPI  -Lifestyle changes discussed with patient.   Type 2 diabetes mellitus with hyperlipidemia (HCC) -Continue sliding scale insulin and follow CBGs fluctuation with further adjustment to hypoglycemic regimen as required. -increase semglee to 4 units BID   Class 3 obesity (HCC) -Body mass index is 43.78 kg/m. -Low-calorie diet, portion control and increase physical activity discussed with patient.   OSA -Continue CPAP nightly. -Patient instructed to be  dilated.  5. The mitral valve is grossly normal. Trivial mitral valve regurgitation.  6. The aortic valve is tricuspid. There is mild calcification of the aortic valve. Aortic valve regurgitation is not visualized. Aortic valve sclerosis/calcification is present, without any evidence of aortic stenosis.  7. There is Moderate (Grade III) plaque involving the descending aorta. Conclusion(s)/Recommendation(s): No valvular vegetations. FINDINGS  Left Ventricle: Left ventricular ejection fraction, by estimation, is 60 to 65%. The left ventricle has normal function. The left ventricle has no regional wall motion abnormalities. The left ventricular internal cavity size was normal in size. There is  mild concentric left ventricular hypertrophy. Right Ventricle: The right ventricular size is normal. Right vetricular wall thickness was not well visualized. Right ventricular systolic function is normal. Left Atrium: Left atrial size was mildly dilated. No left atrial/left atrial appendage thrombus was detected. The LAA emptying velocity was 70 cm/s. Right Atrium: Right atrial size was mildly dilated. Pericardium: There is no evidence of pericardial effusion. Mitral Valve: The mitral valve is grossly normal. Trivial mitral valve regurgitation. Tricuspid Valve: The tricuspid valve is grossly normal. Tricuspid valve regurgitation is mild. Aortic Valve: The aortic valve is tricuspid. There is mild calcification of the aortic valve. Aortic valve regurgitation is not visualized. Aortic valve sclerosis/calcification is present, without any evidence of aortic stenosis. Pulmonic Valve: The pulmonic valve was grossly normal. Pulmonic valve regurgitation is trivial. Aorta: The aortic root is normal in size and structure.  There is moderate (Grade III) plaque involving the descending aorta. IAS/Shunts: No atrial level shunt detected by color flow Doppler. Additional Comments: Spectral Doppler performed. Nona Dell MD Electronically signed by Nona Dell MD Signature Date/Time: 09/18/2023/12:57:59 PM    Final    DG Chest Port 1 View  Result Date: 09/16/2023 CLINICAL DATA:  Encounter removal of vascular catheter. EXAM: PORTABLE CHEST 1 VIEW COMPARISON:  Radiograph 06/14/2023 FINDINGS: The previous left-sided dialysis catheter is been removed. No evidence of pneumothorax. Soft tissue attenuation from habitus limits detailed assessment. Cardiomegaly is stable. Suspected vascular congestion. No large pleural effusion. IMPRESSION: 1. The left-sided dialysis catheter has been removed. No evidence of pneumothorax. 2. Cardiomegaly with vascular congestion. Electronically Signed   By: Narda Rutherford M.D.   On: 09/16/2023 19:14   ECHOCARDIOGRAM COMPLETE  Result Date: 09/15/2023    ECHOCARDIOGRAM REPORT   Patient Name:   [facility] Date of Exam: 09/15/2023 Medical Rec #:  [id number]   Height:       63.0 in Accession #:    [id number]  Weight:       245.0 lb Date of Birth:  [birth year]  BSA:          2.108 m Patient Age:    51 years    BP:           125132/4757 mmHg Patient Gender: F           HR:           93 bpm. [facility] Procedure: 2D Echo, Cardiac Doppler and Color Doppler Indications:    Fever R50.9  History:        Patient has prior history of Echocardiogram examinations, most                 recent 11/19/2022. CHF; Risk Factors:Hypertension, Diabetes and                 Dyslipidemia. ESRD on dialysis [facility]), Breast cancer (HCC), OSA                 (

## 2023-09-21 NOTE — Progress Notes (Signed)
  HEMODIALYSIS TREATMENT NOTE:   Pre-HD c/o "feeling plain sick."  +N/V - Zofran given.  4 hour treatment with 2.5 L goal was attempted but temporary catheter is likely kinked (pt OOB to BR and chair) AEB poor blood flows and unstable pressures.  Venous port would not aspirate. Max Qb 300.  HD cartridge change was indicated one hour into tx, all blood was returned.  High venous pressures were immediately/again observed.  At one point pt requested interruption of UF -- no specific reason, just "stop the pull [UF], I just can't take it today."  Later on, a second cartridge change was indicated. Pt became tearful and declared "I can't do any more today.  Just take me off."  Waiver was signed after education re risks of ending tx early.  All blood was returned.  Total run time: 2 hours 53 minutes Net UF:  1 liter  Post-HD:  09/21/23 1730  Vitals  Temp 98.1 F (36.7 C)  Temp Source Oral  BP 120/60  MAP (mmHg) 80  BP Location Left Leg  BP Method Automatic  Patient Position (if appropriate) Sitting  Pulse Rate 78  Pulse Rate Source Monitor  ECG Heart Rate 77  Resp 16  Oxygen Therapy  SpO2 95 %  O2 Device Nasal Cannula  O2 Flow Rate (L/min) 2 L/min  Post Treatment  Dialyzer Clearance Heavily streaked  Hemodialysis Intake (mL) 0 mL  Liters Processed 40.7  Fluid Removed (mL) 1000 mL  Tolerated HD Treatment No (Comment)  Post-Hemodialysis Comments signed off early /AMA after cartridge clotted for the second time.  Hemodialysis Catheter Left Femoral vein Triple lumen Temporary (Non-Tunneled)  Placement Date/Time: 09/19/23 1200   Placed prior to admission: No  Time Out: Correct site;Correct patient;Correct procedure  Maximum sterile barrier precautions: Hand hygiene;Sterile gown;Sterile probe cover;Cap;Sterile gloves;Mask;Large sterile shee...  Blue Lumen Status Flushed;Heparin locked  Red Lumen Status Flushed;Heparin locked;Dead end cap in place  Purple Lumen Status N/A  Catheter fill  solution Heparin 1000 units/ml  Catheter fill volume (Arterial) 1.4 cc  Catheter fill volume (Venous) 1.4  Dressing Type Transparent;Tube stabilization device  Dressing Status Clean, Dry, Intact  Drainage Description None  Dressing Change Due 09/23/23  Post treatment catheter status Capped and Clamped    Also, pt requested med for vaginal yeast infection.  And to dc telemetry.  Both requests were granted by Dr. Arbutus Leas.  Arman Filter, RN AP [facility]

## 2023-09-22 ENCOUNTER — Inpatient Hospital Stay (HOSPITAL_COMMUNITY): Payer: Medicare Other

## 2023-09-22 DIAGNOSIS — R6520 Severe sepsis without septic shock: Secondary | ICD-10-CM | POA: Diagnosis not present

## 2023-09-22 DIAGNOSIS — A419 Sepsis, unspecified organism: Secondary | ICD-10-CM | POA: Diagnosis not present

## 2023-09-22 DIAGNOSIS — N186 End stage renal disease: Secondary | ICD-10-CM | POA: Diagnosis not present

## 2023-09-22 DIAGNOSIS — E66813 Obesity, class 3: Secondary | ICD-10-CM | POA: Diagnosis not present

## 2023-09-22 LAB — CBC
HCT: 29.1 % — ABNORMAL LOW (ref 36.0–46.0)
Hemoglobin: 8.8 g/dL — ABNORMAL LOW (ref 12.0–15.0)
MCH: 27.7 pg (ref 26.0–34.0)
MCHC: 30.2 g/dL (ref 30.0–36.0)
MCV: 91.5 fL (ref 80.0–100.0)
Platelets: 367 10*3/uL (ref 150–400)
RBC: 3.18 MIL/uL — ABNORMAL LOW (ref 3.87–5.11)
RDW: 14.6 % (ref 11.5–15.5)
WBC: 17.6 10*3/uL — ABNORMAL HIGH (ref 4.0–10.5)
nRBC: 0 % (ref 0.0–0.2)

## 2023-09-22 LAB — GLUCOSE, CAPILLARY
Glucose-Capillary: 272 mg/dL — ABNORMAL HIGH (ref 70–99)
Glucose-Capillary: 260 mg/dL — ABNORMAL HIGH (ref 70–99)
Glucose-Capillary: 271 mg/dL — ABNORMAL HIGH (ref 70–99)
Glucose-Capillary: 214 mg/dL — ABNORMAL HIGH (ref 70–99)

## 2023-09-22 LAB — RENAL FUNCTION PANEL
Albumin: 3.4 g/dL — ABNORMAL LOW (ref 3.5–5.0)
Anion gap: 17 — ABNORMAL HIGH (ref 5–15)
BUN: 65 mg/dL — ABNORMAL HIGH (ref 6–20)
CO2: 23 mmol/L (ref 22–32)
Calcium: 9.6 mg/dL (ref 8.9–10.3)
Chloride: 92 mmol/L — ABNORMAL LOW (ref 98–111)
Creatinine, Ser: 11.62 mg/dL — ABNORMAL HIGH (ref 0.44–1.00)
GFR, Estimated: 4 mL/min — ABNORMAL LOW (ref >60)
Glucose, Bld: 289 mg/dL — ABNORMAL HIGH (ref 70–99)
Phosphorus: 5.9 mg/dL — ABNORMAL HIGH (ref 2.5–4.6)
Potassium: 4.5 mmol/L (ref 3.5–5.1)
Sodium: 132 mmol/L — ABNORMAL LOW (ref 135–145)

## 2023-09-22 MED ORDER — INSULIN GLARGINE-YFGN 100 UNIT/ML ~~LOC~~ SOLN
6.0000 [IU] | Freq: Two times a day (BID) | SUBCUTANEOUS | Status: DC
  Administered 2023-09-22 – 2023-09-23 (×3): 6 [IU] via SUBCUTANEOUS
  Filled 2023-09-22 (×6): qty 0.06

## 2023-09-22 MED ORDER — CHLORHEXIDINE GLUCONATE CLOTH 2 % EX PADS
6.0000 | MEDICATED_PAD | Freq: Every day | CUTANEOUS | Status: DC
  Administered 2023-09-23 – 2023-09-24 (×2): 6 via TOPICAL

## 2023-09-22 MED ORDER — IOHEXOL 9 MG/ML PO SOLN
ORAL | Status: AC
  Filled 2023-09-22: qty 1000

## 2023-09-22 MED ORDER — IOHEXOL 9 MG/ML PO SOLN
500.0000 mL | ORAL | Status: AC
  Administered 2023-09-22 (×2): 500 mL via ORAL

## 2023-09-22 NOTE — Progress Notes (Signed)
saturation; remained stable throughout the procedure. The patient developed no complications during the procedure.  IMPRESSIONS  1. Left ventricular ejection fraction, by estimation, is 60 to 65%. The left ventricle has normal function. The left ventricle has no regional wall motion abnormalities. There is mild concentric left ventricular hypertrophy.  2. Right ventricular systolic function is normal. The right ventricular size is normal.  3. Left atrial size was mildly dilated. No left atrial/left atrial appendage thrombus was detected. The LAA emptying velocity was 70 cm/s.  4. Right atrial size was mildly dilated.  5. The mitral valve is grossly normal. Trivial mitral valve regurgitation.  6. The aortic valve is tricuspid. There is mild calcification of the aortic valve. Aortic valve regurgitation is not visualized. Aortic valve sclerosis/calcification is present, without any evidence of aortic stenosis.  7. There is Moderate (Grade III) plaque involving the descending aorta. Conclusion(s)/Recommendation(s): No valvular vegetations. FINDINGS  Left Ventricle: Left ventricular ejection fraction, by estimation, is 60 to 65%. The left ventricle has normal function. The left ventricle has no regional wall motion abnormalities. The left ventricular internal cavity size was normal in size. There is  mild concentric left ventricular hypertrophy. Right Ventricle: The right ventricular size is normal. Right vetricular wall thickness was not well visualized. Right ventricular systolic function is normal. Left Atrium:  Left atrial size was mildly dilated. No left atrial/left atrial appendage thrombus was detected. The LAA emptying velocity was 70 cm/s. Right Atrium: Right atrial size was mildly dilated. Pericardium: There is no evidence of pericardial effusion. Mitral Valve: The mitral valve is grossly normal. Trivial mitral valve regurgitation. Tricuspid Valve: The tricuspid valve is grossly normal. Tricuspid valve regurgitation is mild. Aortic Valve: The aortic valve is tricuspid. There is mild calcification of the aortic valve. Aortic valve regurgitation is not visualized. Aortic valve sclerosis/calcification is present, without any evidence of aortic stenosis. Pulmonic Valve: The pulmonic valve was grossly normal. Pulmonic valve regurgitation is trivial. Aorta: The aortic root is normal in size and structure. There is moderate (Grade III) plaque involving the descending aorta. IAS/Shunts: No atrial level shunt detected by color flow Doppler. Additional Comments: Spectral Doppler performed. Nona Dell MD Electronically signed by Nona Dell MD Signature Date/Time: 09/18/2023/12:57:59 PM    Final    DG Chest Port 1 View  Result Date: 09/16/2023 CLINICAL DATA:  Encounter removal of vascular catheter. EXAM: PORTABLE CHEST 1 VIEW COMPARISON:  Radiograph 06/14/2023 FINDINGS: The previous left-sided dialysis catheter is been removed. No evidence of pneumothorax. Soft tissue attenuation from habitus limits detailed assessment. Cardiomegaly is stable. Suspected vascular congestion. No large pleural effusion. IMPRESSION: 1. The left-sided dialysis catheter has been removed. No evidence of pneumothorax. 2. Cardiomegaly with vascular congestion. Electronically Signed   By: Narda Rutherford M.D.   On: 09/16/2023 19:14   ECHOCARDIOGRAM COMPLETE  Result Date: 09/15/2023    ECHOCARDIOGRAM REPORT   Patient Name:   [facility] Date of Exam: 09/15/2023 Medical Rec #:  [id number]   Height:       63.0 in Accession #:    [id number]   Weight:       245.0 lb Date of Birth:  [birth year]  BSA:          2.108 m Patient Age:    52 years    BP:           125132/4757 mmHg Patient Gender: F           HR:  PROGRESS NOTE  Marleta Lapierre ZHY:[id number] DOB: [birth year] DOA: 09/14/2023 PCP: [name], PA-C  Brief History:  As per H&P written by Dr. [name] on 09/14/2023 Andres Escandon is a 52 y.o. female with medical history significant of ESRD on hemodialysis, (M,W,F), obesity class 3, T2DM, hypertension and asthma who presented with dyspnea, fevers and left neck pain.    Patient has a left upper extremity fistula that has been malfunctioning, she underwent Fistulogram on 08.27.24 by Dr [name] and then had a follow up with with vascular surgery in [facility] 09/03/23, plan for further follow up with vascular ultrasound.  In the meanwhile she has been getting HD through a tunneled catheter in her left internal jugular vein.    Patient was noted to have a low grade fever on Wednesday, 09/25 during dialysis. She was treated with acetaminophen and completed HD. During that session, initially her left upper extremity fistula was used but she developed pain and discomfort, not able to complete HD by this route, then completed her treatment through her HD catheter.    The following day she felt a little better, but still not back to baseline, she continue having chills and generalized malaise.  Yesterday she had several episodes of vomiting prior to HD. She was medicated with ondansetron, acetaminophen and lomotil. She was able to complete her HD treatment through her HD catheter.    At home she continue to have chills and now developed pain in her left shoulder area, radiating into her head and left ear. No improving factors.  The patient was found to have MSSA bacteremia.  She was started on cefazolin.  Her tunneled dialysis catheter was removed on 09/16/2023 and a temporary dialysis catheter was placed after a line holiday.  TEE was negative for vegetations.  Subsequently, her repeat blood cultures remained negative.  IR was reconsulted for placement of a new tunneled Alysis catheter.  The patient  began developing some intermittent vomiting.  She stated that she did not have a bowel movement in several days.  CT of the abdomen and pelvis was ordered.   Assessment/Plan:  * Severe sepsis secondary to MSSA bacteremia (HCC) -Severe sepsis with elevated leukocytosis, lactic acid, present on admission.  -Continue IV antibiotics--Ancef -Old Dialysis catheter has been removed 9/30 -10/2 TEE--no vegetations -Stable vital signs. -9/30 repeat blood cultures>>neg x 5 days -09/19/23 blood cultures--neg to date -hopeful for new HD tunneled catheter on 10/6 or 10/7   Acute on chronic hypoxemic and hypercapnic respiratory failure. (Sepsis related).  -Continue supplemental 02  -chronically on 2L at home -currently on 4L>>weaned to 2L -Continue CPAP nightly. -9/30 CXR - vascular congestion   ESRD on dialysis [facility]) -Patient chronically Monday, Wednesday and Friday for dialysis purposes -Last treatment on 09/16/2023 -Nephrology service consulted and will follow recommendations. -Patient's hemodialysis catheter successfully removed after dialysis treatment on 09/16/2023. -10/3--temporary HD catheter placed by gen surg--appreciate Dr. [name] -10/3 and 10/5--last HD; -hopefully can have tunneled HD cath done mon/tues   Nausea and vomiting -suspect gastroparesis initially -now with no BM and abd pain -CT abd/pelvis -around the clock antiemetics -pantoprazole bid   Right Knee pain -xray right knee--neg effusion, neg fx -consulted ortho--advised against arthrocentesis, no effusion noted on exam, knee was not red or warm to touch noted no low likelihood of septic joint  -MR knee--Mild proximal anteromedial collateral ligament sprain with mild surrounding soft tissue edema.  No osteomyelitis;  intrasubstance degeneration of medical and  series 13, image 14). Lateral:  No cartilage defect is seen. Joint: Nojoint effusion. Normal Hoffa's fat pad. No plical thickening. Popliteal Fossa:  No Baker's cyst. Extensor Mechanism:  Intact quadriceps tendon and patellar tendon. Bones:  No acute fracture or dislocation. No definite sinus tract is seen. No cortical erosion is seen. Other: None. IMPRESSION: 1. 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 This appears to contact the inferior aspect of the posterior wall of the posterior horn of the medial meniscus, however no tear is seen extending through an articular surface of the medial meniscus. 2. Intrasubstance degeneration throughout the root of the anterior horn of the lateral meniscus. No tear is seen extending through an articular surface of the  lateral meniscus. 3. Mild proximal anteromedial collateral ligament sprain with mild surrounding soft tissue edema. 4. Moderate thinning of the posterior non weight-bearing medial femoral condyle cartilage with multiple subchondral cysts. Of acute 5. No MRI evidence acute osteomyelitis. Electronically Signed   By: Neita Garnet M.D.   On: 09/20/2023 20:58   DG Knee 1-2 Views Right  Result Date: 09/18/2023 CLINICAL DATA:  Right knee pain.  No known injury. EXAM: RIGHT KNEE - 1-2 VIEW COMPARISON:  None Available. FINDINGS: There is diffuse decreased bone mineralization. No joint effusion. Joint spaces are preserved. No acute fracture or dislocation. High-grade atherosclerotic calcifications. IMPRESSION: 1. No significant osteoarthritis. 2. No acute fracture or dislocation. 3. High-grade atherosclerotic calcifications. Electronically Signed   By: Neita Garnet M.D.   On: 09/18/2023 17:59   ECHO TEE  Result Date: 09/18/2023    TRANSESOPHOGEAL ECHO REPORT   Patient Name:   [facility] Date of Exam: 09/18/2023 Medical Rec #:  [id number]   Height:       63.0 in Accession #:    [id number]  Weight:       247.1 lb Date of Birth:  [birth year]  BSA:          2.116 m Patient Age:    52 years    BP:           165/81 mmHg Patient Gender: F           HR:           91 bpm. [facility] Procedure: Transesophageal Echo, Cardiac Doppler and Color Doppler Indications:    Bacteremia R78.81  History:        Patient has prior history of Echocardiogram examinations, most                 recent 09/15/2023. CHF; Risk Factors:Hypertension, Diabetes and                 Dyslipidemia. ESRD on dialysis [facility]), Breast cancer (HCC), OSA                 (obstructive sleep apnea), Covid-19.  Sonographer:    [name] RCS Referring Phys: [id number] [name] PROCEDURE: After discussion of the risks and benefits of a TEE, an informed consent was obtained from the patient. TEE procedure time was 7 minutes. The transesophogeal  probe was passed without difficulty through the esophogus of the patient. Imaged were  obtained with the patient in a left lateral decubitus position. Local oropharyngeal anesthetic was provided with viscous lidocaine. Sedation performed by different physician. The patient was monitored while under deep sedation. Image quality was good. The patient's vital signs; including heart rate, blood pressure, and oxygen  series 13, image 14). Lateral:  No cartilage defect is seen. Joint: Nojoint effusion. Normal Hoffa's fat pad. No plical thickening. Popliteal Fossa:  No Baker's cyst. Extensor Mechanism:  Intact quadriceps tendon and patellar tendon. Bones:  No acute fracture or dislocation. No definite sinus tract is seen. No cortical erosion is seen. Other: None. IMPRESSION: 1. 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 This appears to contact the inferior aspect of the posterior wall of the posterior horn of the medial meniscus, however no tear is seen extending through an articular surface of the medial meniscus. 2. Intrasubstance degeneration throughout the root of the anterior horn of the lateral meniscus. No tear is seen extending through an articular surface of the  lateral meniscus. 3. Mild proximal anteromedial collateral ligament sprain with mild surrounding soft tissue edema. 4. Moderate thinning of the posterior non weight-bearing medial femoral condyle cartilage with multiple subchondral cysts. Of acute 5. No MRI evidence acute osteomyelitis. Electronically Signed   By: Neita Garnet M.D.   On: 09/20/2023 20:58   DG Knee 1-2 Views Right  Result Date: 09/18/2023 CLINICAL DATA:  Right knee pain.  No known injury. EXAM: RIGHT KNEE - 1-2 VIEW COMPARISON:  None Available. FINDINGS: There is diffuse decreased bone mineralization. No joint effusion. Joint spaces are preserved. No acute fracture or dislocation. High-grade atherosclerotic calcifications. IMPRESSION: 1. No significant osteoarthritis. 2. No acute fracture or dislocation. 3. High-grade atherosclerotic calcifications. Electronically Signed   By: Neita Garnet M.D.   On: 09/18/2023 17:59   ECHO TEE  Result Date: 09/18/2023    TRANSESOPHOGEAL ECHO REPORT   Patient Name:   [facility] Date of Exam: 09/18/2023 Medical Rec #:  [id number]   Height:       63.0 in Accession #:    [id number]  Weight:       247.1 lb Date of Birth:  [birth year]  BSA:          2.116 m Patient Age:    52 years    BP:           165/81 mmHg Patient Gender: F           HR:           91 bpm. [facility] Procedure: Transesophageal Echo, Cardiac Doppler and Color Doppler Indications:    Bacteremia R78.81  History:        Patient has prior history of Echocardiogram examinations, most                 recent 09/15/2023. CHF; Risk Factors:Hypertension, Diabetes and                 Dyslipidemia. ESRD on dialysis [facility]), Breast cancer (HCC), OSA                 (obstructive sleep apnea), Covid-19.  Sonographer:    [name] RCS Referring Phys: [id number] [name] PROCEDURE: After discussion of the risks and benefits of a TEE, an informed consent was obtained from the patient. TEE procedure time was 7 minutes. The transesophogeal  probe was passed without difficulty through the esophogus of the patient. Imaged were  obtained with the patient in a left lateral decubitus position. Local oropharyngeal anesthetic was provided with viscous lidocaine. Sedation performed by different physician. The patient was monitored while under deep sedation. Image quality was good. The patient's vital signs; including heart rate, blood pressure, and oxygen  saturation; remained stable throughout the procedure. The patient developed no complications during the procedure.  IMPRESSIONS  1. Left ventricular ejection fraction, by estimation, is 60 to 65%. The left ventricle has normal function. The left ventricle has no regional wall motion abnormalities. There is mild concentric left ventricular hypertrophy.  2. Right ventricular systolic function is normal. The right ventricular size is normal.  3. Left atrial size was mildly dilated. No left atrial/left atrial appendage thrombus was detected. The LAA emptying velocity was 70 cm/s.  4. Right atrial size was mildly dilated.  5. The mitral valve is grossly normal. Trivial mitral valve regurgitation.  6. The aortic valve is tricuspid. There is mild calcification of the aortic valve. Aortic valve regurgitation is not visualized. Aortic valve sclerosis/calcification is present, without any evidence of aortic stenosis.  7. There is Moderate (Grade III) plaque involving the descending aorta. Conclusion(s)/Recommendation(s): No valvular vegetations. FINDINGS  Left Ventricle: Left ventricular ejection fraction, by estimation, is 60 to 65%. The left ventricle has normal function. The left ventricle has no regional wall motion abnormalities. The left ventricular internal cavity size was normal in size. There is  mild concentric left ventricular hypertrophy. Right Ventricle: The right ventricular size is normal. Right vetricular wall thickness was not well visualized. Right ventricular systolic function is normal. Left Atrium:  Left atrial size was mildly dilated. No left atrial/left atrial appendage thrombus was detected. The LAA emptying velocity was 70 cm/s. Right Atrium: Right atrial size was mildly dilated. Pericardium: There is no evidence of pericardial effusion. Mitral Valve: The mitral valve is grossly normal. Trivial mitral valve regurgitation. Tricuspid Valve: The tricuspid valve is grossly normal. Tricuspid valve regurgitation is mild. Aortic Valve: The aortic valve is tricuspid. There is mild calcification of the aortic valve. Aortic valve regurgitation is not visualized. Aortic valve sclerosis/calcification is present, without any evidence of aortic stenosis. Pulmonic Valve: The pulmonic valve was grossly normal. Pulmonic valve regurgitation is trivial. Aorta: The aortic root is normal in size and structure. There is moderate (Grade III) plaque involving the descending aorta. IAS/Shunts: No atrial level shunt detected by color flow Doppler. Additional Comments: Spectral Doppler performed. Nona Dell MD Electronically signed by Nona Dell MD Signature Date/Time: 09/18/2023/12:57:59 PM    Final    DG Chest Port 1 View  Result Date: 09/16/2023 CLINICAL DATA:  Encounter removal of vascular catheter. EXAM: PORTABLE CHEST 1 VIEW COMPARISON:  Radiograph 06/14/2023 FINDINGS: The previous left-sided dialysis catheter is been removed. No evidence of pneumothorax. Soft tissue attenuation from habitus limits detailed assessment. Cardiomegaly is stable. Suspected vascular congestion. No large pleural effusion. IMPRESSION: 1. The left-sided dialysis catheter has been removed. No evidence of pneumothorax. 2. Cardiomegaly with vascular congestion. Electronically Signed   By: Narda Rutherford M.D.   On: 09/16/2023 19:14   ECHOCARDIOGRAM COMPLETE  Result Date: 09/15/2023    ECHOCARDIOGRAM REPORT   Patient Name:   [facility] Date of Exam: 09/15/2023 Medical Rec #:  [id number]   Height:       63.0 in Accession #:    [id number]   Weight:       245.0 lb Date of Birth:  [birth year]  BSA:          2.108 m Patient Age:    52 years    BP:           125132/4757 mmHg Patient Gender: F           HR:  saturation; remained stable throughout the procedure. The patient developed no complications during the procedure.  IMPRESSIONS  1. Left ventricular ejection fraction, by estimation, is 60 to 65%. The left ventricle has normal function. The left ventricle has no regional wall motion abnormalities. There is mild concentric left ventricular hypertrophy.  2. Right ventricular systolic function is normal. The right ventricular size is normal.  3. Left atrial size was mildly dilated. No left atrial/left atrial appendage thrombus was detected. The LAA emptying velocity was 70 cm/s.  4. Right atrial size was mildly dilated.  5. The mitral valve is grossly normal. Trivial mitral valve regurgitation.  6. The aortic valve is tricuspid. There is mild calcification of the aortic valve. Aortic valve regurgitation is not visualized. Aortic valve sclerosis/calcification is present, without any evidence of aortic stenosis.  7. There is Moderate (Grade III) plaque involving the descending aorta. Conclusion(s)/Recommendation(s): No valvular vegetations. FINDINGS  Left Ventricle: Left ventricular ejection fraction, by estimation, is 60 to 65%. The left ventricle has normal function. The left ventricle has no regional wall motion abnormalities. The left ventricular internal cavity size was normal in size. There is  mild concentric left ventricular hypertrophy. Right Ventricle: The right ventricular size is normal. Right vetricular wall thickness was not well visualized. Right ventricular systolic function is normal. Left Atrium:  Left atrial size was mildly dilated. No left atrial/left atrial appendage thrombus was detected. The LAA emptying velocity was 70 cm/s. Right Atrium: Right atrial size was mildly dilated. Pericardium: There is no evidence of pericardial effusion. Mitral Valve: The mitral valve is grossly normal. Trivial mitral valve regurgitation. Tricuspid Valve: The tricuspid valve is grossly normal. Tricuspid valve regurgitation is mild. Aortic Valve: The aortic valve is tricuspid. There is mild calcification of the aortic valve. Aortic valve regurgitation is not visualized. Aortic valve sclerosis/calcification is present, without any evidence of aortic stenosis. Pulmonic Valve: The pulmonic valve was grossly normal. Pulmonic valve regurgitation is trivial. Aorta: The aortic root is normal in size and structure. There is moderate (Grade III) plaque involving the descending aorta. IAS/Shunts: No atrial level shunt detected by color flow Doppler. Additional Comments: Spectral Doppler performed. Nona Dell MD Electronically signed by Nona Dell MD Signature Date/Time: 09/18/2023/12:57:59 PM    Final    DG Chest Port 1 View  Result Date: 09/16/2023 CLINICAL DATA:  Encounter removal of vascular catheter. EXAM: PORTABLE CHEST 1 VIEW COMPARISON:  Radiograph 06/14/2023 FINDINGS: The previous left-sided dialysis catheter is been removed. No evidence of pneumothorax. Soft tissue attenuation from habitus limits detailed assessment. Cardiomegaly is stable. Suspected vascular congestion. No large pleural effusion. IMPRESSION: 1. The left-sided dialysis catheter has been removed. No evidence of pneumothorax. 2. Cardiomegaly with vascular congestion. Electronically Signed   By: Narda Rutherford M.D.   On: 09/16/2023 19:14   ECHOCARDIOGRAM COMPLETE  Result Date: 09/15/2023    ECHOCARDIOGRAM REPORT   Patient Name:   [facility] Date of Exam: 09/15/2023 Medical Rec #:  [id number]   Height:       63.0 in Accession #:    [id number]   Weight:       245.0 lb Date of Birth:  [birth year]  BSA:          2.108 m Patient Age:    52 years    BP:           125132/4757 mmHg Patient Gender: F           HR:  PROGRESS NOTE  Marleta Lapierre ZHY:[id number] DOB: [birth year] DOA: 09/14/2023 PCP: [name], PA-C  Brief History:  As per H&P written by Dr. [name] on 09/14/2023 Andres Escandon is a 52 y.o. female with medical history significant of ESRD on hemodialysis, (M,W,F), obesity class 3, T2DM, hypertension and asthma who presented with dyspnea, fevers and left neck pain.    Patient has a left upper extremity fistula that has been malfunctioning, she underwent Fistulogram on 08.27.24 by Dr [name] and then had a follow up with with vascular surgery in [facility] 09/03/23, plan for further follow up with vascular ultrasound.  In the meanwhile she has been getting HD through a tunneled catheter in her left internal jugular vein.    Patient was noted to have a low grade fever on Wednesday, 09/25 during dialysis. She was treated with acetaminophen and completed HD. During that session, initially her left upper extremity fistula was used but she developed pain and discomfort, not able to complete HD by this route, then completed her treatment through her HD catheter.    The following day she felt a little better, but still not back to baseline, she continue having chills and generalized malaise.  Yesterday she had several episodes of vomiting prior to HD. She was medicated with ondansetron, acetaminophen and lomotil. She was able to complete her HD treatment through her HD catheter.    At home she continue to have chills and now developed pain in her left shoulder area, radiating into her head and left ear. No improving factors.  The patient was found to have MSSA bacteremia.  She was started on cefazolin.  Her tunneled dialysis catheter was removed on 09/16/2023 and a temporary dialysis catheter was placed after a line holiday.  TEE was negative for vegetations.  Subsequently, her repeat blood cultures remained negative.  IR was reconsulted for placement of a new tunneled Alysis catheter.  The patient  began developing some intermittent vomiting.  She stated that she did not have a bowel movement in several days.  CT of the abdomen and pelvis was ordered.   Assessment/Plan:  * Severe sepsis secondary to MSSA bacteremia (HCC) -Severe sepsis with elevated leukocytosis, lactic acid, present on admission.  -Continue IV antibiotics--Ancef -Old Dialysis catheter has been removed 9/30 -10/2 TEE--no vegetations -Stable vital signs. -9/30 repeat blood cultures>>neg x 5 days -09/19/23 blood cultures--neg to date -hopeful for new HD tunneled catheter on 10/6 or 10/7   Acute on chronic hypoxemic and hypercapnic respiratory failure. (Sepsis related).  -Continue supplemental 02  -chronically on 2L at home -currently on 4L>>weaned to 2L -Continue CPAP nightly. -9/30 CXR - vascular congestion   ESRD on dialysis [facility]) -Patient chronically Monday, Wednesday and Friday for dialysis purposes -Last treatment on 09/16/2023 -Nephrology service consulted and will follow recommendations. -Patient's hemodialysis catheter successfully removed after dialysis treatment on 09/16/2023. -10/3--temporary HD catheter placed by gen surg--appreciate Dr. [name] -10/3 and 10/5--last HD; -hopefully can have tunneled HD cath done mon/tues   Nausea and vomiting -suspect gastroparesis initially -now with no BM and abd pain -CT abd/pelvis -around the clock antiemetics -pantoprazole bid   Right Knee pain -xray right knee--neg effusion, neg fx -consulted ortho--advised against arthrocentesis, no effusion noted on exam, knee was not red or warm to touch noted no low likelihood of septic joint  -MR knee--Mild proximal anteromedial collateral ligament sprain with mild surrounding soft tissue edema.  No osteomyelitis;  intrasubstance degeneration of medical and  saturation; remained stable throughout the procedure. The patient developed no complications during the procedure.  IMPRESSIONS  1. Left ventricular ejection fraction, by estimation, is 60 to 65%. The left ventricle has normal function. The left ventricle has no regional wall motion abnormalities. There is mild concentric left ventricular hypertrophy.  2. Right ventricular systolic function is normal. The right ventricular size is normal.  3. Left atrial size was mildly dilated. No left atrial/left atrial appendage thrombus was detected. The LAA emptying velocity was 70 cm/s.  4. Right atrial size was mildly dilated.  5. The mitral valve is grossly normal. Trivial mitral valve regurgitation.  6. The aortic valve is tricuspid. There is mild calcification of the aortic valve. Aortic valve regurgitation is not visualized. Aortic valve sclerosis/calcification is present, without any evidence of aortic stenosis.  7. There is Moderate (Grade III) plaque involving the descending aorta. Conclusion(s)/Recommendation(s): No valvular vegetations. FINDINGS  Left Ventricle: Left ventricular ejection fraction, by estimation, is 60 to 65%. The left ventricle has normal function. The left ventricle has no regional wall motion abnormalities. The left ventricular internal cavity size was normal in size. There is  mild concentric left ventricular hypertrophy. Right Ventricle: The right ventricular size is normal. Right vetricular wall thickness was not well visualized. Right ventricular systolic function is normal. Left Atrium:  Left atrial size was mildly dilated. No left atrial/left atrial appendage thrombus was detected. The LAA emptying velocity was 70 cm/s. Right Atrium: Right atrial size was mildly dilated. Pericardium: There is no evidence of pericardial effusion. Mitral Valve: The mitral valve is grossly normal. Trivial mitral valve regurgitation. Tricuspid Valve: The tricuspid valve is grossly normal. Tricuspid valve regurgitation is mild. Aortic Valve: The aortic valve is tricuspid. There is mild calcification of the aortic valve. Aortic valve regurgitation is not visualized. Aortic valve sclerosis/calcification is present, without any evidence of aortic stenosis. Pulmonic Valve: The pulmonic valve was grossly normal. Pulmonic valve regurgitation is trivial. Aorta: The aortic root is normal in size and structure. There is moderate (Grade III) plaque involving the descending aorta. IAS/Shunts: No atrial level shunt detected by color flow Doppler. Additional Comments: Spectral Doppler performed. Nona Dell MD Electronically signed by Nona Dell MD Signature Date/Time: 09/18/2023/12:57:59 PM    Final    DG Chest Port 1 View  Result Date: 09/16/2023 CLINICAL DATA:  Encounter removal of vascular catheter. EXAM: PORTABLE CHEST 1 VIEW COMPARISON:  Radiograph 06/14/2023 FINDINGS: The previous left-sided dialysis catheter is been removed. No evidence of pneumothorax. Soft tissue attenuation from habitus limits detailed assessment. Cardiomegaly is stable. Suspected vascular congestion. No large pleural effusion. IMPRESSION: 1. The left-sided dialysis catheter has been removed. No evidence of pneumothorax. 2. Cardiomegaly with vascular congestion. Electronically Signed   By: Narda Rutherford M.D.   On: 09/16/2023 19:14   ECHOCARDIOGRAM COMPLETE  Result Date: 09/15/2023    ECHOCARDIOGRAM REPORT   Patient Name:   [facility] Date of Exam: 09/15/2023 Medical Rec #:  [id number]   Height:       63.0 in Accession #:    [id number]   Weight:       245.0 lb Date of Birth:  [birth year]  BSA:          2.108 m Patient Age:    52 years    BP:           125132/4757 mmHg Patient Gender: F           HR:  series 13, image 14). Lateral:  No cartilage defect is seen. Joint: Nojoint effusion. Normal Hoffa's fat pad. No plical thickening. Popliteal Fossa:  No Baker's cyst. Extensor Mechanism:  Intact quadriceps tendon and patellar tendon. Bones:  No acute fracture or dislocation. No definite sinus tract is seen. No cortical erosion is seen. Other: None. IMPRESSION: 1. 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 This appears to contact the inferior aspect of the posterior wall of the posterior horn of the medial meniscus, however no tear is seen extending through an articular surface of the medial meniscus. 2. Intrasubstance degeneration throughout the root of the anterior horn of the lateral meniscus. No tear is seen extending through an articular surface of the  lateral meniscus. 3. Mild proximal anteromedial collateral ligament sprain with mild surrounding soft tissue edema. 4. Moderate thinning of the posterior non weight-bearing medial femoral condyle cartilage with multiple subchondral cysts. Of acute 5. No MRI evidence acute osteomyelitis. Electronically Signed   By: Neita Garnet M.D.   On: 09/20/2023 20:58   DG Knee 1-2 Views Right  Result Date: 09/18/2023 CLINICAL DATA:  Right knee pain.  No known injury. EXAM: RIGHT KNEE - 1-2 VIEW COMPARISON:  None Available. FINDINGS: There is diffuse decreased bone mineralization. No joint effusion. Joint spaces are preserved. No acute fracture or dislocation. High-grade atherosclerotic calcifications. IMPRESSION: 1. No significant osteoarthritis. 2. No acute fracture or dislocation. 3. High-grade atherosclerotic calcifications. Electronically Signed   By: Neita Garnet M.D.   On: 09/18/2023 17:59   ECHO TEE  Result Date: 09/18/2023    TRANSESOPHOGEAL ECHO REPORT   Patient Name:   [facility] Date of Exam: 09/18/2023 Medical Rec #:  [id number]   Height:       63.0 in Accession #:    [id number]  Weight:       247.1 lb Date of Birth:  [birth year]  BSA:          2.116 m Patient Age:    52 years    BP:           165/81 mmHg Patient Gender: F           HR:           91 bpm. [facility] Procedure: Transesophageal Echo, Cardiac Doppler and Color Doppler Indications:    Bacteremia R78.81  History:        Patient has prior history of Echocardiogram examinations, most                 recent 09/15/2023. CHF; Risk Factors:Hypertension, Diabetes and                 Dyslipidemia. ESRD on dialysis [facility]), Breast cancer (HCC), OSA                 (obstructive sleep apnea), Covid-19.  Sonographer:    [name] RCS Referring Phys: [id number] [name] PROCEDURE: After discussion of the risks and benefits of a TEE, an informed consent was obtained from the patient. TEE procedure time was 7 minutes. The transesophogeal  probe was passed without difficulty through the esophogus of the patient. Imaged were  obtained with the patient in a left lateral decubitus position. Local oropharyngeal anesthetic was provided with viscous lidocaine. Sedation performed by different physician. The patient was monitored while under deep sedation. Image quality was good. The patient's vital signs; including heart rate, blood pressure, and oxygen  saturation; remained stable throughout the procedure. The patient developed no complications during the procedure.  IMPRESSIONS  1. Left ventricular ejection fraction, by estimation, is 60 to 65%. The left ventricle has normal function. The left ventricle has no regional wall motion abnormalities. There is mild concentric left ventricular hypertrophy.  2. Right ventricular systolic function is normal. The right ventricular size is normal.  3. Left atrial size was mildly dilated. No left atrial/left atrial appendage thrombus was detected. The LAA emptying velocity was 70 cm/s.  4. Right atrial size was mildly dilated.  5. The mitral valve is grossly normal. Trivial mitral valve regurgitation.  6. The aortic valve is tricuspid. There is mild calcification of the aortic valve. Aortic valve regurgitation is not visualized. Aortic valve sclerosis/calcification is present, without any evidence of aortic stenosis.  7. There is Moderate (Grade III) plaque involving the descending aorta. Conclusion(s)/Recommendation(s): No valvular vegetations. FINDINGS  Left Ventricle: Left ventricular ejection fraction, by estimation, is 60 to 65%. The left ventricle has normal function. The left ventricle has no regional wall motion abnormalities. The left ventricular internal cavity size was normal in size. There is  mild concentric left ventricular hypertrophy. Right Ventricle: The right ventricular size is normal. Right vetricular wall thickness was not well visualized. Right ventricular systolic function is normal. Left Atrium:  Left atrial size was mildly dilated. No left atrial/left atrial appendage thrombus was detected. The LAA emptying velocity was 70 cm/s. Right Atrium: Right atrial size was mildly dilated. Pericardium: There is no evidence of pericardial effusion. Mitral Valve: The mitral valve is grossly normal. Trivial mitral valve regurgitation. Tricuspid Valve: The tricuspid valve is grossly normal. Tricuspid valve regurgitation is mild. Aortic Valve: The aortic valve is tricuspid. There is mild calcification of the aortic valve. Aortic valve regurgitation is not visualized. Aortic valve sclerosis/calcification is present, without any evidence of aortic stenosis. Pulmonic Valve: The pulmonic valve was grossly normal. Pulmonic valve regurgitation is trivial. Aorta: The aortic root is normal in size and structure. There is moderate (Grade III) plaque involving the descending aorta. IAS/Shunts: No atrial level shunt detected by color flow Doppler. Additional Comments: Spectral Doppler performed. Nona Dell MD Electronically signed by Nona Dell MD Signature Date/Time: 09/18/2023/12:57:59 PM    Final    DG Chest Port 1 View  Result Date: 09/16/2023 CLINICAL DATA:  Encounter removal of vascular catheter. EXAM: PORTABLE CHEST 1 VIEW COMPARISON:  Radiograph 06/14/2023 FINDINGS: The previous left-sided dialysis catheter is been removed. No evidence of pneumothorax. Soft tissue attenuation from habitus limits detailed assessment. Cardiomegaly is stable. Suspected vascular congestion. No large pleural effusion. IMPRESSION: 1. The left-sided dialysis catheter has been removed. No evidence of pneumothorax. 2. Cardiomegaly with vascular congestion. Electronically Signed   By: Narda Rutherford M.D.   On: 09/16/2023 19:14   ECHOCARDIOGRAM COMPLETE  Result Date: 09/15/2023    ECHOCARDIOGRAM REPORT   Patient Name:   [facility] Date of Exam: 09/15/2023 Medical Rec #:  [id number]   Height:       63.0 in Accession #:    [id number]   Weight:       245.0 lb Date of Birth:  [birth year]  BSA:          2.108 m Patient Age:    52 years    BP:           125132/4757 mmHg Patient Gender: F           HR:  series 13, image 14). Lateral:  No cartilage defect is seen. Joint: Nojoint effusion. Normal Hoffa's fat pad. No plical thickening. Popliteal Fossa:  No Baker's cyst. Extensor Mechanism:  Intact quadriceps tendon and patellar tendon. Bones:  No acute fracture or dislocation. No definite sinus tract is seen. No cortical erosion is seen. Other: None. IMPRESSION: 1. 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 This appears to contact the inferior aspect of the posterior wall of the posterior horn of the medial meniscus, however no tear is seen extending through an articular surface of the medial meniscus. 2. Intrasubstance degeneration throughout the root of the anterior horn of the lateral meniscus. No tear is seen extending through an articular surface of the  lateral meniscus. 3. Mild proximal anteromedial collateral ligament sprain with mild surrounding soft tissue edema. 4. Moderate thinning of the posterior non weight-bearing medial femoral condyle cartilage with multiple subchondral cysts. Of acute 5. No MRI evidence acute osteomyelitis. Electronically Signed   By: Neita Garnet M.D.   On: 09/20/2023 20:58   DG Knee 1-2 Views Right  Result Date: 09/18/2023 CLINICAL DATA:  Right knee pain.  No known injury. EXAM: RIGHT KNEE - 1-2 VIEW COMPARISON:  None Available. FINDINGS: There is diffuse decreased bone mineralization. No joint effusion. Joint spaces are preserved. No acute fracture or dislocation. High-grade atherosclerotic calcifications. IMPRESSION: 1. No significant osteoarthritis. 2. No acute fracture or dislocation. 3. High-grade atherosclerotic calcifications. Electronically Signed   By: Neita Garnet M.D.   On: 09/18/2023 17:59   ECHO TEE  Result Date: 09/18/2023    TRANSESOPHOGEAL ECHO REPORT   Patient Name:   [facility] Date of Exam: 09/18/2023 Medical Rec #:  [id number]   Height:       63.0 in Accession #:    [id number]  Weight:       247.1 lb Date of Birth:  [birth year]  BSA:          2.116 m Patient Age:    52 years    BP:           165/81 mmHg Patient Gender: F           HR:           91 bpm. [facility] Procedure: Transesophageal Echo, Cardiac Doppler and Color Doppler Indications:    Bacteremia R78.81  History:        Patient has prior history of Echocardiogram examinations, most                 recent 09/15/2023. CHF; Risk Factors:Hypertension, Diabetes and                 Dyslipidemia. ESRD on dialysis [facility]), Breast cancer (HCC), OSA                 (obstructive sleep apnea), Covid-19.  Sonographer:    [name] RCS Referring Phys: [id number] [name] PROCEDURE: After discussion of the risks and benefits of a TEE, an informed consent was obtained from the patient. TEE procedure time was 7 minutes. The transesophogeal  probe was passed without difficulty through the esophogus of the patient. Imaged were  obtained with the patient in a left lateral decubitus position. Local oropharyngeal anesthetic was provided with viscous lidocaine. Sedation performed by different physician. The patient was monitored while under deep sedation. Image quality was good. The patient's vital signs; including heart rate, blood pressure, and oxygen  saturation; remained stable throughout the procedure. The patient developed no complications during the procedure.  IMPRESSIONS  1. Left ventricular ejection fraction, by estimation, is 60 to 65%. The left ventricle has normal function. The left ventricle has no regional wall motion abnormalities. There is mild concentric left ventricular hypertrophy.  2. Right ventricular systolic function is normal. The right ventricular size is normal.  3. Left atrial size was mildly dilated. No left atrial/left atrial appendage thrombus was detected. The LAA emptying velocity was 70 cm/s.  4. Right atrial size was mildly dilated.  5. The mitral valve is grossly normal. Trivial mitral valve regurgitation.  6. The aortic valve is tricuspid. There is mild calcification of the aortic valve. Aortic valve regurgitation is not visualized. Aortic valve sclerosis/calcification is present, without any evidence of aortic stenosis.  7. There is Moderate (Grade III) plaque involving the descending aorta. Conclusion(s)/Recommendation(s): No valvular vegetations. FINDINGS  Left Ventricle: Left ventricular ejection fraction, by estimation, is 60 to 65%. The left ventricle has normal function. The left ventricle has no regional wall motion abnormalities. The left ventricular internal cavity size was normal in size. There is  mild concentric left ventricular hypertrophy. Right Ventricle: The right ventricular size is normal. Right vetricular wall thickness was not well visualized. Right ventricular systolic function is normal. Left Atrium:  Left atrial size was mildly dilated. No left atrial/left atrial appendage thrombus was detected. The LAA emptying velocity was 70 cm/s. Right Atrium: Right atrial size was mildly dilated. Pericardium: There is no evidence of pericardial effusion. Mitral Valve: The mitral valve is grossly normal. Trivial mitral valve regurgitation. Tricuspid Valve: The tricuspid valve is grossly normal. Tricuspid valve regurgitation is mild. Aortic Valve: The aortic valve is tricuspid. There is mild calcification of the aortic valve. Aortic valve regurgitation is not visualized. Aortic valve sclerosis/calcification is present, without any evidence of aortic stenosis. Pulmonic Valve: The pulmonic valve was grossly normal. Pulmonic valve regurgitation is trivial. Aorta: The aortic root is normal in size and structure. There is moderate (Grade III) plaque involving the descending aorta. IAS/Shunts: No atrial level shunt detected by color flow Doppler. Additional Comments: Spectral Doppler performed. Nona Dell MD Electronically signed by Nona Dell MD Signature Date/Time: 09/18/2023/12:57:59 PM    Final    DG Chest Port 1 View  Result Date: 09/16/2023 CLINICAL DATA:  Encounter removal of vascular catheter. EXAM: PORTABLE CHEST 1 VIEW COMPARISON:  Radiograph 06/14/2023 FINDINGS: The previous left-sided dialysis catheter is been removed. No evidence of pneumothorax. Soft tissue attenuation from habitus limits detailed assessment. Cardiomegaly is stable. Suspected vascular congestion. No large pleural effusion. IMPRESSION: 1. The left-sided dialysis catheter has been removed. No evidence of pneumothorax. 2. Cardiomegaly with vascular congestion. Electronically Signed   By: Narda Rutherford M.D.   On: 09/16/2023 19:14   ECHOCARDIOGRAM COMPLETE  Result Date: 09/15/2023    ECHOCARDIOGRAM REPORT   Patient Name:   [facility] Date of Exam: 09/15/2023 Medical Rec #:  [id number]   Height:       63.0 in Accession #:    [id number]   Weight:       245.0 lb Date of Birth:  [birth year]  BSA:          2.108 m Patient Age:    52 years    BP:           125132/4757 mmHg Patient Gender: F           HR:

## 2023-09-22 NOTE — Hospital Course (Signed)
As per H&P written by Dr. [name] on 09/14/2023 Patricia [name] is a 52 y.o. female with medical history significant of ESRD on hemodialysis, (M,W,F), obesity class 3, T2DM, hypertension and asthma who presented with dyspnea, fevers and left neck pain.    Patient has a left upper extremity fistula that has been malfunctioning, she underwent Fistulogram on 08.27.24 by Dr [name] and then had a follow up with with vascular surgery in [facility] 09/03/23, plan for further follow up with vascular ultrasound.  In the meanwhile she has been getting HD through a tunneled catheter in her left internal jugular vein.    Patient was noted to have a low grade fever on Wednesday, 09/25 during dialysis. She was treated with acetaminophen and completed HD. During that session, initially her left upper extremity fistula was used but she developed pain and discomfort, not able to complete HD by this route, then completed her treatment through her HD catheter.    The following day she felt a little better, but still not back to baseline, she continue having chills and generalized malaise.  Yesterday she had several episodes of vomiting prior to HD. She was medicated with ondansetron, acetaminophen and lomotil. She was able to complete her HD treatment through her HD catheter.    At home she continue to have chills and now developed pain in her left shoulder area, radiating into her head and left ear. No improving factors.  The patient was found to have MSSA bacteremia.  She was started on cefazolin.  Her tunneled dialysis catheter was removed on 09/16/2023 and a temporary dialysis catheter was placed after a line holiday.  TEE was negative for vegetations.  Subsequently, her repeat blood cultures remained negative.  IR was reconsulted for placement of a new tunneled Alysis catheter.  The patient began developing some intermittent vomiting.  She stated that she did not have a bowel movement in several days.  CT of the abdomen and  pelvis was ordered.

## 2023-09-22 NOTE — Progress Notes (Signed)
Patient refused miralax states " It doesn't work " . Requested enema , notified Dr Tat , awaiting new orders per physician

## 2023-09-22 NOTE — Progress Notes (Signed)
Pt reported she has not had a bowel movement in over a week. Hypoactive bowel sounds auscultated. No tenderness noted upon palpation. Pt reports feeling nauseated throughout the night.

## 2023-09-22 NOTE — Plan of Care (Signed)
  Problem: Education: Goal: Understanding of CV disease, CV risk reduction, and recovery process will improve Outcome: Progressing Goal: Individualized Educational Video(s) Outcome: Progressing   Problem: Activity: Goal: Ability to return to baseline activity level will improve Outcome: Progressing   

## 2023-09-23 DIAGNOSIS — A419 Sepsis, unspecified organism: Secondary | ICD-10-CM | POA: Diagnosis not present

## 2023-09-23 DIAGNOSIS — N186 End stage renal disease: Secondary | ICD-10-CM | POA: Diagnosis not present

## 2023-09-23 DIAGNOSIS — I10 Essential (primary) hypertension: Secondary | ICD-10-CM | POA: Diagnosis not present

## 2023-09-23 DIAGNOSIS — R6520 Severe sepsis without septic shock: Secondary | ICD-10-CM | POA: Diagnosis not present

## 2023-09-23 LAB — GLUCOSE, CAPILLARY
Glucose-Capillary: 341 mg/dL — ABNORMAL HIGH (ref 70–99)
Glucose-Capillary: 302 mg/dL — ABNORMAL HIGH (ref 70–99)
Glucose-Capillary: 312 mg/dL — ABNORMAL HIGH (ref 70–99)
Glucose-Capillary: 317 mg/dL — ABNORMAL HIGH (ref 70–99)

## 2023-09-23 LAB — RENAL FUNCTION PANEL
Albumin: 3.3 g/dL — ABNORMAL LOW (ref 3.5–5.0)
Anion gap: 19 — ABNORMAL HIGH (ref 5–15)
BUN: 73 mg/dL — ABNORMAL HIGH (ref 6–20)
CO2: 21 mmol/L — ABNORMAL LOW (ref 22–32)
Calcium: 9.2 mg/dL (ref 8.9–10.3)
Chloride: 89 mmol/L — ABNORMAL LOW (ref 98–111)
Creatinine, Ser: 13.58 mg/dL — ABNORMAL HIGH (ref 0.44–1.00)
GFR, Estimated: 3 mL/min — ABNORMAL LOW (ref >60)
Glucose, Bld: 279 mg/dL — ABNORMAL HIGH (ref 70–99)
Phosphorus: 6.3 mg/dL — ABNORMAL HIGH (ref 2.5–4.6)
Potassium: 4.4 mmol/L (ref 3.5–5.1)
Sodium: 129 mmol/L — ABNORMAL LOW (ref 135–145)

## 2023-09-23 MED ORDER — SORBITOL 70 % SOLN
400.0000 mL | TOPICAL_OIL | Freq: Once | ORAL | Status: AC
  Administered 2023-09-23: 400 mL via RECTAL
  Filled 2023-09-23: qty 120

## 2023-09-23 NOTE — Progress Notes (Signed)
PHARMACY CONSULT NOTE FOR:  OUTPATIENT  PARENTERAL ANTIBIOTIC THERAPY (OPAT)  Indication: MSSA bacteremia Regimen: Cefazolin 2 gm IV with HD MWF  End date: 10/30/23  No formal OPAT will be done as patient will receive antibiotics with HD. Nephrology team aware of stop date.     Thank you for allowing pharmacy to be a part of this patient's care.  Sharin Mons, PharmD, BCPS, BCIDP Infectious Diseases Clinical Pharmacist Phone: [phone] 09/23/2023, 9:37 AM

## 2023-09-23 NOTE — Progress Notes (Signed)
Patient ID: Arnetia Fusilier, female   DOB: [birth year], 52 y.o.   MRN: [id number] S:  Constipation is her only complaint Awaiting plans with IR for potential TDC placement Last HD 10/5, femoral catheter work poorly 10/3 blood cultures x2 no growth at 4 days K4.4, BUN 73 this morning Afebrile, vital signs are stable  O:BP 135/76 (BP Location: Left Leg)   Pulse 81   Temp 98.2 F (36.8 C) (Oral)   Resp 20   Ht 5\' 3"  (1.6 m)   Wt 111.3 kg   SpO2 93%   BMI 43.47 kg/m   Intake/Output Summary (Last 24 hours) at 09/23/2023 0941 Last data filed at 09/22/2023 1700 Gross per 24 hour  Intake 240 ml  Output --  Net 240 ml   Intake/Output: I/O last 3 completed shifts: In: 360 [P.O.:360] Out: -   Intake/Output this shift:  No intake/output data recorded. Weight change:  Gen: NAD CVS: RRR Resp: CTA Abd: +BS, soft, NT/ND Ext: no edema, LUE AVF +T/B, left femoral HD catheter in place.   Recent Labs  Lab 09/16/23 1319 09/18/23 0856 09/19/23 1640 09/21/23 0556 09/21/23 1410 09/21/23 1411 09/22/23 0348 09/23/23 0352  NA 131* 131* 132*  --  132* 132* 132* 129*  K 4.2 4.1 3.9  --  4.3 4.2 4.5 4.4  CL 90* 90* 88*  --  90* 90* 92* 89*  CO2 17* 21* 22  --  22 21* 23 21*  GLUCOSE 188* 235* 367*  --  326* 326* 289* 279*  BUN 88* 79* 98*  --  75* 75* 65* 73*  CREATININE 14.29* 11.94* 15.60* 12.24* 12.96* 13.00* 11.62* 13.58*  ALBUMIN 3.1*  --  3.2*  --   --  3.6 3.4* 3.3*  CALCIUM 9.2 9.1 9.6  --  10.3 10.4* 9.6 9.2  PHOS 7.6*  --  6.5*  --   --  6.9* 5.9* 6.3*   Liver Function Tests: Recent Labs  Lab 09/21/23 1411 09/22/23 0348 09/23/23 0352  ALBUMIN 3.6 3.4* 3.3*   No results for input(s): "LIPASE", "AMYLASE" in the last 168 hours. No results for input(s): "AMMONIA" in the last 168 hours. CBC: Recent Labs  Lab 09/16/23 1319 09/18/23 1622 09/19/23 1640 09/21/23 1410 09/22/23 0348  WBC 13.1* 15.0* 15.9* 20.3* 17.6*  HGB 8.8* 8.7* 8.3* 9.0* 8.8*  HCT 27.5* 28.1* 27.3* 29.2*  29.1*  MCV 89.0 90.6 91.0 91.0 91.5  PLT 176 231 240 345 367   Cardiac Enzymes: No results for input(s): "CKTOTAL", "CKMB", "CKMBINDEX", "TROPONINI" in the last 168 hours. CBG: Recent Labs  Lab 09/22/23 0742 09/22/23 1129 09/22/23 1719 09/22/23 2306 09/23/23 0741  GLUCAP 272* 260* 271* 214* 341*    Iron Studies: No results for input(s): "IRON", "TIBC", "TRANSFERRIN", "FERRITIN" in the last 72 hours. Studies/Results: CT ABDOMEN PELVIS WO CONTRAST  Result Date: 09/23/2023 CLINICAL DATA:  Abdominal pain and vomiting. Suspect bowel obstruction. Previously evaluated for chronic abdominal pain. She has a history of breast cancer and end-stage renal failure on dialysis. EXAM: CT ABDOMEN AND PELVIS WITHOUT CONTRAST TECHNIQUE: Multidetector CT imaging of the abdomen and pelvis was performed following the standard protocol without IV contrast. RADIATION DOSE REDUCTION: This exam was performed according to the departmental dose-optimization program which includes automated exposure control, adjustment of the mA and/or kV according to patient size and/or use of iterative reconstruction technique. COMPARISON:  CT abdomen and pelvis without contrast 02/24/2023, CT abdomen and pelvis with contrast 07/02/2019. FINDINGS: Lower chest: There is a new rounded  solid nodule in the anterior basal segment of the left lower lobe measuring 1.3 cm, and a new pleural-based 1.2 cm nodule posteromedially in the right lower lobe on 4:19. Given the patient's prior breast cancer history, the usual Fleischner Society guidelines do not apply. Chest CT is recommended to evaluate for other nodules. Scattered linear scarring or atelectasis is again noted in both bases. Again noted mosaicism consistent with air trapping and small airways disease. Lung bases are otherwise clear. There are postsurgical changes partially visualized consistent with left mastectomy. The heart is slightly enlarged but unchanged. There is scattered  calcification mitral ring. No pericardial effusion. Hepatobiliary: The liver is 21 cm length with homogeneous noncontrast attenuation. No masses seen without contrast. The gallbladder again surgically absent without biliary dilatation. Pancreas: No mass is seen without contrast. Spleen: No masses seen without contrast.  No splenomegaly. Adrenals/Urinary Tract: No adrenal mass. Both kidneys are atrophic consistent with end-stage renal disease with heavy renovascular calcifications. There is no contour deforming mass of either kidney. No urinary stones or obstruction. Stomach/Bowel: Enteric contrast was administered prior to imaging. No dilatation or wall thickening is seen including of the appendix. There is moderate retained stool transverse, descending and rectosigmoid colon, scattered diverticulosis without evidence of diverticulitis. Vascular/Lymphatic: Patchy aortoiliac and visceral branch vessel atherosclerosis without AAA there is a left femoral dialysis catheter with its tip in the left common iliac vein distal segment. No appreciable adenopathy. Reproductive: Status post hysterectomy. No adnexal masses. Other: There is a small umbilical fat hernia. Additional small fat hernia of the midline low anterior pelvic wall. There are no incarcerated hernias. There is an overhanging abdominal wall fat panniculus showing increased stranding and skin thickening right of the midline. This could be due to 1 or more recent injections, other local trauma or infectious process. No abscess is seen. Within the abdomen and pelvis proper there is no free fluid, free hemorrhage, free air or focal inflammatory process. Musculoskeletal: There are degenerative changes of the thoracic and lumbar spine but no destructive bone lesions. Heterotopic soft tissue bone formations are noted the anterior to both proximal femurs likely due to remote trauma. IMPRESSION: 1. No acute noncontrast CT findings in the abdomen or pelvis. 2.  Constipation and diverticulosis. No bowel obstruction or inflammation. 3. Umbilical and low anterior pelvic wall fat hernias. 4. Overhanging abdominal wall fat panniculus with increased stranding and skin thickening right of the midline. This could be due to 1 or more recent injections, other local trauma or infectious process. No abscess is seen. 5. New 1.3 cm left lower lobe and 1.2 cm right lower lobe nodules. Chest CT is recommended to evaluate for other nodules. 6. Aortic and coronary artery atherosclerosis. 7. End-stage renal disease with atrophic kidneys and heavy renovascular calcifications. 8. Left femoral dialysis catheter with its tip in the distal left common iliac vein. Aortic Atherosclerosis (ICD10-I70.0). Electronically Signed   By: Almira Bar M.D.   On: 09/23/2023 01:38    aspirin EC  81 mg Oral Daily   Chlorhexidine Gluconate Cloth  6 each Topical Q0600   cinacalcet  30 mg Oral Q breakfast   diclofenac Sodium  4 g Topical QID   heparin injection (subcutaneous)  5,000 Units Subcutaneous Q8H   insulin aspart  0-5 Units Subcutaneous QHS   insulin aspart  0-6 Units Subcutaneous TID WC   insulin glargine-yfgn  6 Units Subcutaneous BID   labetalol  100 mg Oral BID   linaclotide  290 mcg Oral QAC breakfast  loratadine  10 mg Oral Daily   montelukast  10 mg Oral QHS   ondansetron (ZOFRAN) IV  4 mg Intravenous Q6H   pantoprazole (PROTONIX) IV  40 mg Intravenous Q12H   polyethylene glycol  17 g Oral Daily   rOPINIRole  2 mg Oral QHS   rosuvastatin  20 mg Oral Daily   senna  2 tablet Oral Daily   sevelamer carbonate  1,600 mg Oral BID WC   sevelamer carbonate  2,400 mg Oral Q lunch    BMET    Component Value Date/Time   NA 129 (L) 09/23/2023 0352   K 4.4 09/23/2023 0352   CL 89 (L) 09/23/2023 0352   CO2 21 (L) 09/23/2023 0352   GLUCOSE 279 (H) 09/23/2023 0352   BUN 73 (H) 09/23/2023 0352   CREATININE 13.58 (H) 09/23/2023 0352   CREATININE 5.93 (H) 06/29/2019 1210    CALCIUM 9.2 09/23/2023 0352   GFRNONAA 3 (L) 09/23/2023 0352   GFRNONAA 8 (L) 06/29/2019 1210   GFRAA 4 (L) 08/04/2020 0452   GFRAA 9 (L) 06/29/2019 1210   CBC    Component Value Date/Time   WBC 17.6 (H) 09/22/2023 0348   RBC 3.18 (L) 09/22/2023 0348   HGB 8.8 (L) 09/22/2023 0348   HCT 29.1 (L) 09/22/2023 0348   PLT 367 09/22/2023 0348   MCV 91.5 09/22/2023 0348   MCH 27.7 09/22/2023 0348   MCHC 30.2 09/22/2023 0348   RDW 14.6 09/22/2023 0348   LYMPHSABS 0.3 (L) 09/14/2023 1716   MONOABS 1.0 09/14/2023 1716   EOSABS 1.2 (H) 09/14/2023 1716   BASOSABS 0.1 09/14/2023 1716     Assessment/Plan:   MSSA sepsis/bacteremia - presumably due to [facility] exit site and tunnel infection.  Currently on ancef.  TDC removed 09/16/23 and had temp left femoral HD catheter placed 09/19/23 by Dr. [name] due to leukocytosis.  09/19/23 B Cx x2 NGTD.  [facility] suggests cefazolin with HD x6wk, last date 11/13, will f/u with ID 10/28/23.  Ready for new TDC placed by IR next week.  TEE negative for vegetations.   ESRD - normally on MWF.  Femoral catheter functioning poorly.  Plan will be hopeful TDC today and dialysis thereafter. 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  Unable to cannulate venous portion.  As above  [name], MD  [employer]

## 2023-09-23 NOTE — Inpatient Diabetes Management (Signed)
Inpatient Diabetes Program Recommendations  AACE/ADA: New Consensus Statement on Inpatient Glycemic Control  Target Ranges:  Prepandial:   less than 140 mg/dL      Peak postprandial:   less than 180 mg/dL (1-2 hours)      Critically ill patients:  140 - 180 mg/dL   Review of Glycemic Control  Latest Reference Range & Units 09/22/23 07:42 09/22/23 11:29 09/22/23 17:19 09/22/23 23:06 09/23/23 07:41 09/23/23 11:54  Glucose-Capillary 70 - 99 mg/dL 161 (H) 096 (H) 045 (H) 214 (H) 341 (H) 302 (H)   Diabetes history: DM2 Outpatient Diabetes medications: Glipizide 5 mg QAM, Tresiba 20 units daily Current orders for Inpatient glycemic control:  Semglee 6 units bid Novolog 0-6 units TID + HS   Inpatient Diabetes Program Recommendations:     -   Please consider increasing Semglee to 8 units BID  Thanks, Christena Deem RN, MSN, BC-ADM Inpatient Diabetes Coordinator Team Pager [phone] (8a-5p)

## 2023-09-23 NOTE — Progress Notes (Signed)
Patient given soap suds enema , brown water did come out of patients rectum she then sat on the toilet for 5 minutes , she c/o being impacted and that fecal matter was at the bottom she could feel it and requested a SMOG enema, Notified Dr. Arbutus Leas of patient request. Advised her to try and sit on the toilet for a while however she wanted to request a different enema as she states that is what has worked for her in the past.

## 2023-09-23 NOTE — Progress Notes (Addendum)
Interventional Radiology Brief Note:  Patient with temp cath in need of conversion to tunneled catheter placement.    Spoke with Dr. Marisue Humble and Dr. Arbutus Leas re: persistent elevated WBC.  Patient s/p blood cx 10/3 which are thus far negative.  She is being treated for line infection with Ancef. TEE negative for vegetation.  Remains afebrile.  Not on steroids. They both agree with proceeding with tunneled line placement.   IR unable to accommodate round trip procedure today, however will plan for placement tomorrow morning.  Spoke with Leotis Shames, RN who will arrange [facility] transport to arrive at [facility] at [facility].   NPO order updated for 10/8.   Formal consult and consent to be done in Radiology tomorrow.   Loyce Dys, MS RD PA-C

## 2023-09-23 NOTE — Progress Notes (Signed)
Smog enema effective patient had BM x 1 .

## 2023-09-23 NOTE — Progress Notes (Signed)
[facility]  Discussed with Dr. Arbutus Leas. IR plans to place tunneled tomorrow and will remove the femoral line.   Appreciate assistance.  Algis Greenhouse, MD [facility] [address] Vella Raring [place], [place] [address] [phone] (office)

## 2023-09-23 NOTE — Progress Notes (Signed)
PROGRESS NOTE  Patricia [name] ZOX:[id number] DOB: 1971-04-01 DOA: 09/14/2023 PCP: Patricia Epley, PA-C  Brief History:  As per H&P written by Dr. [name] on 09/14/2023 Patricia [name] is a 52 y.o. female with medical history significant of ESRD on hemodialysis, (M,W,F), obesity class 3, T2DM, hypertension and asthma who presented with dyspnea, fevers and left neck pain.    Patient has a left upper extremity fistula that has been malfunctioning, she underwent Fistulogram on 08.27.24 by Dr [name] and then had a follow up with with vascular surgery in [facility] 09/03/23, plan for further follow up with vascular ultrasound.  In the meanwhile she has been getting HD through a tunneled catheter in her left internal jugular vein.    Patient was noted to have a low grade fever on Wednesday, 09/25 during dialysis. She was treated with acetaminophen and completed HD. During that session, initially her left upper extremity fistula was used but she developed pain and discomfort, not able to complete HD by this route, then completed her treatment through her HD catheter.    The following day she felt a little better, but still not back to baseline, she continue having chills and generalized malaise.  Yesterday she had several episodes of vomiting prior to HD. She was medicated with ondansetron, acetaminophen and lomotil. She was able to complete her HD treatment through her HD catheter.    At home she continue to have chills and now developed pain in her left shoulder area, radiating into her head and left ear. No improving factors.  The patient was found to have MSSA bacteremia.  She was started on cefazolin.  Her tunneled dialysis catheter was removed on 09/16/2023 and a temporary dialysis catheter was placed after a line holiday.  TEE was negative for vegetations.  Subsequently, her repeat blood cultures remained negative.  IR was reconsulted for placement of a new tunneled Alysis catheter.  The patient  began developing some intermittent vomiting.  She stated that she did not have a bowel movement in several days.  CT of the abdomen and pelvis was ordered.   Assessment/Plan:  Severe sepsis secondary to MSSA bacteremia (HCC) -Severe sepsis with elevated leukocytosis, lactic acid, present on admission.  -Continue IV antibiotics--Ancef -Old Dialysis catheter has been removed 9/30 -10/2 TEE--no vegetations -Stable vital signs. -9/30 repeat blood cultures>>neg x 5 days -09/19/23 blood cultures--neg to date -plan to d/c with cefazolin on HD MWF with last dose on 10/30/23 -hopeful for new HD tunneled catheter on 10/6 or 10/7   Acute on chronic hypoxemic and hypercapnic respiratory failure. (Sepsis related).  -Continue supplemental 02  -chronically on 2L at home -currently on 4L>>weaned to 2L -Continue CPAP nightly. -9/30 CXR - vascular congestion -10/29/22 CT chest high res--Regions of cryptogenic organizing pneumonia (COP) suspected as well as possible ILD   ESRD on dialysis [facility]) -Patient chronically Monday, Wednesday and Friday for dialysis purposes -Last treatment on 09/16/2023 -Nephrology service consulted and will follow recommendations. -Patient's hemodialysis catheter successfully removed after dialysis treatment on 09/16/2023. -10/3--temporary HD catheter placed by gen surg--appreciate Dr. [name] -10/3 and 10/5--last HD; -hopefully can have tunneled HD cath done mon/tues   Nausea and vomiting/Abd pain -suspect gastroparesis initially -now with no BM and abd pain -CT abd/pelvis--no obstruction; moderate retained stool transverse, descending and rectosigmoid colon;    -around the clock antiemetics -pantoprazole bid  Pulmonary nodules -incidental findings on CT abd/pelvis -will need outpatient surveillance   Right Knee pain -  PROGRESS NOTE  Patricia [name] ZOX:[id number] DOB: 1971-04-01 DOA: 09/14/2023 PCP: Patricia Epley, PA-C  Brief History:  As per H&P written by Dr. [name] on 09/14/2023 Patricia [name] is a 52 y.o. female with medical history significant of ESRD on hemodialysis, (M,W,F), obesity class 3, T2DM, hypertension and asthma who presented with dyspnea, fevers and left neck pain.    Patient has a left upper extremity fistula that has been malfunctioning, she underwent Fistulogram on 08.27.24 by Dr [name] and then had a follow up with with vascular surgery in [facility] 09/03/23, plan for further follow up with vascular ultrasound.  In the meanwhile she has been getting HD through a tunneled catheter in her left internal jugular vein.    Patient was noted to have a low grade fever on Wednesday, 09/25 during dialysis. She was treated with acetaminophen and completed HD. During that session, initially her left upper extremity fistula was used but she developed pain and discomfort, not able to complete HD by this route, then completed her treatment through her HD catheter.    The following day she felt a little better, but still not back to baseline, she continue having chills and generalized malaise.  Yesterday she had several episodes of vomiting prior to HD. She was medicated with ondansetron, acetaminophen and lomotil. She was able to complete her HD treatment through her HD catheter.    At home she continue to have chills and now developed pain in her left shoulder area, radiating into her head and left ear. No improving factors.  The patient was found to have MSSA bacteremia.  She was started on cefazolin.  Her tunneled dialysis catheter was removed on 09/16/2023 and a temporary dialysis catheter was placed after a line holiday.  TEE was negative for vegetations.  Subsequently, her repeat blood cultures remained negative.  IR was reconsulted for placement of a new tunneled Alysis catheter.  The patient  began developing some intermittent vomiting.  She stated that she did not have a bowel movement in several days.  CT of the abdomen and pelvis was ordered.   Assessment/Plan:  Severe sepsis secondary to MSSA bacteremia (HCC) -Severe sepsis with elevated leukocytosis, lactic acid, present on admission.  -Continue IV antibiotics--Ancef -Old Dialysis catheter has been removed 9/30 -10/2 TEE--no vegetations -Stable vital signs. -9/30 repeat blood cultures>>neg x 5 days -09/19/23 blood cultures--neg to date -plan to d/c with cefazolin on HD MWF with last dose on 10/30/23 -hopeful for new HD tunneled catheter on 10/6 or 10/7   Acute on chronic hypoxemic and hypercapnic respiratory failure. (Sepsis related).  -Continue supplemental 02  -chronically on 2L at home -currently on 4L>>weaned to 2L -Continue CPAP nightly. -9/30 CXR - vascular congestion -10/29/22 CT chest high res--Regions of cryptogenic organizing pneumonia (COP) suspected as well as possible ILD   ESRD on dialysis [facility]) -Patient chronically Monday, Wednesday and Friday for dialysis purposes -Last treatment on 09/16/2023 -Nephrology service consulted and will follow recommendations. -Patient's hemodialysis catheter successfully removed after dialysis treatment on 09/16/2023. -10/3--temporary HD catheter placed by gen surg--appreciate Dr. [name] -10/3 and 10/5--last HD; -hopefully can have tunneled HD cath done mon/tues   Nausea and vomiting/Abd pain -suspect gastroparesis initially -now with no BM and abd pain -CT abd/pelvis--no obstruction; moderate retained stool transverse, descending and rectosigmoid colon;    -around the clock antiemetics -pantoprazole bid  Pulmonary nodules -incidental findings on CT abd/pelvis -will need outpatient surveillance   Right Knee pain -  Name:   [facility] Date of Exam: 09/15/2023 Medical Rec #:  [id number]   Height:       63.0 in Accession #:    [id number]  Weight:       245.0 lb Date of Birth:  Dec 27, 1970  BSA:          2.108 m Patient Age:    51 years    BP:           125132/4757 mmHg Patient Gender: F           HR:           93 bpm. [facility] Procedure: 2D Echo, Cardiac Doppler and Color Doppler Indications:    Fever R50.9  History:        Patient has prior history of Echocardiogram examinations, most                 recent 11/19/2022. CHF; Risk Factors:Hypertension, Diabetes and                 Dyslipidemia. ESRD on dialysis [facility]), Breast cancer (HCC), OSA                 (obstructive sleep apnea), Covid-19.  Sonographer:    [name] RCS Referring Phys: [id number] MAURICIO DANIEL ARRIEN IMPRESSIONS  1. Left ventricular ejection fraction, by estimation, is 60 to 65%. The left ventricle has normal function. The left ventricle has no regional wall motion abnormalities. There is mild concentric left ventricular hypertrophy. Left ventricular diastolic parameters are consistent with Grade I diastolic dysfunction (impaired relaxation).  2. Right ventricular systolic function is normal. The right ventricular size is normal. Tricuspid regurgitation signal is inadequate for assessing PA pressure.  3. No  evidence of mitral valve regurgitation.  4. The aortic valve is grossly normal. Aortic valve regurgitation is not visualized. Mild aortic valve stenosis.  5. The inferior vena cava is normal in size with greater than 50% respiratory variability, suggesting right atrial pressure of 3 mmHg. Comparison(s): No significant change from prior study. Conclusion(s)/Recommendation(s): No evidence of valvular vegetations on this transthoracic echocardiogram. Consider a transesophageal echocardiogram to exclude infective endocarditis if clinically indicated. FINDINGS  Left Ventricle: Left ventricular ejection fraction, by estimation, is 60 to 65%. The left ventricle has normal function. The left ventricle has no regional wall motion abnormalities. The left ventricular internal cavity size was normal in size. There is  mild concentric left ventricular hypertrophy. Left ventricular diastolic parameters are consistent with Grade I diastolic dysfunction (impaired relaxation). Right Ventricle: The right ventricular size is normal. Right ventricular systolic function is normal. Tricuspid regurgitation signal is inadequate for assessing PA pressure. Left Atrium: Left atrial size was normal in size. Right Atrium: Right atrial size was normal in size. Pericardium: There is no evidence of pericardial effusion. Mitral Valve: Mild mitral annular calcification. No evidence of mitral valve regurgitation. Tricuspid Valve: Tricuspid valve regurgitation is not demonstrated. Aortic Valve: The aortic valve is grossly normal. Aortic valve regurgitation is not visualized. Mild aortic stenosis is present. Aortic valve mean gradient measures 10.3 mmHg. Aortic valve peak gradient measures 21.8 mmHg. Aortic valve area, by VTI measures 1.93 cm. Pulmonic Valve: Pulmonic valve regurgitation is not visualized. Aorta: The aortic root is normal in size and structure. Venous: The inferior vena cava is normal in size with greater than 50% respiratory  variability, suggesting right atrial pressure of 3 mmHg. IAS/Shunts: The interatrial septum was not well visualized.  LEFT VENTRICLE PLAX 2D LVIDd:  Name:   [facility] Date of Exam: 09/15/2023 Medical Rec #:  [id number]   Height:       63.0 in Accession #:    [id number]  Weight:       245.0 lb Date of Birth:  Dec 27, 1970  BSA:          2.108 m Patient Age:    51 years    BP:           125132/4757 mmHg Patient Gender: F           HR:           93 bpm. [facility] Procedure: 2D Echo, Cardiac Doppler and Color Doppler Indications:    Fever R50.9  History:        Patient has prior history of Echocardiogram examinations, most                 recent 11/19/2022. CHF; Risk Factors:Hypertension, Diabetes and                 Dyslipidemia. ESRD on dialysis [facility]), Breast cancer (HCC), OSA                 (obstructive sleep apnea), Covid-19.  Sonographer:    [name] RCS Referring Phys: [id number] MAURICIO DANIEL ARRIEN IMPRESSIONS  1. Left ventricular ejection fraction, by estimation, is 60 to 65%. The left ventricle has normal function. The left ventricle has no regional wall motion abnormalities. There is mild concentric left ventricular hypertrophy. Left ventricular diastolic parameters are consistent with Grade I diastolic dysfunction (impaired relaxation).  2. Right ventricular systolic function is normal. The right ventricular size is normal. Tricuspid regurgitation signal is inadequate for assessing PA pressure.  3. No  evidence of mitral valve regurgitation.  4. The aortic valve is grossly normal. Aortic valve regurgitation is not visualized. Mild aortic valve stenosis.  5. The inferior vena cava is normal in size with greater than 50% respiratory variability, suggesting right atrial pressure of 3 mmHg. Comparison(s): No significant change from prior study. Conclusion(s)/Recommendation(s): No evidence of valvular vegetations on this transthoracic echocardiogram. Consider a transesophageal echocardiogram to exclude infective endocarditis if clinically indicated. FINDINGS  Left Ventricle: Left ventricular ejection fraction, by estimation, is 60 to 65%. The left ventricle has normal function. The left ventricle has no regional wall motion abnormalities. The left ventricular internal cavity size was normal in size. There is  mild concentric left ventricular hypertrophy. Left ventricular diastolic parameters are consistent with Grade I diastolic dysfunction (impaired relaxation). Right Ventricle: The right ventricular size is normal. Right ventricular systolic function is normal. Tricuspid regurgitation signal is inadequate for assessing PA pressure. Left Atrium: Left atrial size was normal in size. Right Atrium: Right atrial size was normal in size. Pericardium: There is no evidence of pericardial effusion. Mitral Valve: Mild mitral annular calcification. No evidence of mitral valve regurgitation. Tricuspid Valve: Tricuspid valve regurgitation is not demonstrated. Aortic Valve: The aortic valve is grossly normal. Aortic valve regurgitation is not visualized. Mild aortic stenosis is present. Aortic valve mean gradient measures 10.3 mmHg. Aortic valve peak gradient measures 21.8 mmHg. Aortic valve area, by VTI measures 1.93 cm. Pulmonic Valve: Pulmonic valve regurgitation is not visualized. Aorta: The aortic root is normal in size and structure. Venous: The inferior vena cava is normal in size with greater than 50% respiratory  variability, suggesting right atrial pressure of 3 mmHg. IAS/Shunts: The interatrial septum was not well visualized.  LEFT VENTRICLE PLAX 2D LVIDd:  Cruciates: Within the limitations of motion artifact, the ACL and PCL appear intact. Collaterals: There is mild thickening and mild intermediate T2 signal within the proximal anterior aspect of the medial collateral ligament (axial series 8 images 12 and 13, coronal series 11 image 14. Mild-to-moderate edema around the anteromedial aspect of the medial collateral ligament. The fibular collateral ligament, biceps femoris tendon, iliotibial band, and popliteus tendon are intact. CARTILAGE Patellofemoral: Within the limitations of motion artifact, no definite cartilage defect is seen. Medial: Moderate thinning of the posterior non weight-bearing medial femoral condyle cartilage with multiple subchondral cysts (sagittal series 17, image 22 and coronal series 13, image 14). Lateral:  No cartilage defect is seen. Joint: Nojoint effusion. Normal  Hoffa's fat pad. No plical thickening. Popliteal Fossa:  No Baker's cyst. Extensor Mechanism:  Intact quadriceps tendon and patellar tendon. Bones:  No acute fracture or dislocation. No definite sinus tract is seen. No cortical erosion is seen. Other: None. IMPRESSION: 1. 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 This appears to contact the inferior aspect of the posterior wall of the posterior horn of the medial meniscus, however no tear is seen extending through an articular surface of the medial meniscus. 2. Intrasubstance degeneration throughout the root of the anterior horn of the lateral meniscus. No tear is seen extending through an articular surface of the lateral meniscus. 3. Mild proximal anteromedial collateral ligament sprain with mild surrounding soft tissue edema. 4. Moderate thinning of the posterior non weight-bearing medial femoral condyle cartilage with multiple subchondral cysts. Of acute 5. No MRI evidence acute osteomyelitis. Electronically Signed   By: Neita Garnet M.D.   On: 09/20/2023 20:58   DG Knee 1-2 Views Right  Result Date: 09/18/2023 CLINICAL DATA:  Right knee pain.  No known injury. EXAM: RIGHT KNEE - 1-2 VIEW COMPARISON:  None Available. FINDINGS: There is diffuse decreased bone mineralization. No joint effusion. Joint spaces are preserved. No acute fracture or dislocation. High-grade atherosclerotic calcifications. IMPRESSION: 1. No significant osteoarthritis. 2. No acute fracture or dislocation. 3. High-grade atherosclerotic calcifications. Electronically Signed   By: Neita Garnet M.D.   On: 09/18/2023 17:59   ECHO TEE  Result Date: 09/18/2023    TRANSESOPHOGEAL ECHO REPORT   Patient Name:   [facility] Date of Exam: 09/18/2023 Medical Rec #:  [id number]   Height:       63.0 in Accession #:    [id number]  Weight:       247.1 lb Date of Birth:  May 07, 1971  BSA:          2.116 m Patient Age:     51 years    BP:           165/81 mmHg Patient Gender: F           HR:           91 bpm. [facility] Procedure: Transesophageal Echo, Cardiac Doppler and Color Doppler Indications:    Bacteremia R78.81  History:        Patient has prior history of Echocardiogram examinations, most                 recent 09/15/2023. CHF; Risk Factors:Hypertension, Diabetes and                 Dyslipidemia. ESRD on dialysis [facility]), Breast cancer (HCC), OSA                 (  Name:   [facility] Date of Exam: 09/15/2023 Medical Rec #:  [id number]   Height:       63.0 in Accession #:    [id number]  Weight:       245.0 lb Date of Birth:  Dec 27, 1970  BSA:          2.108 m Patient Age:    51 years    BP:           125132/4757 mmHg Patient Gender: F           HR:           93 bpm. [facility] Procedure: 2D Echo, Cardiac Doppler and Color Doppler Indications:    Fever R50.9  History:        Patient has prior history of Echocardiogram examinations, most                 recent 11/19/2022. CHF; Risk Factors:Hypertension, Diabetes and                 Dyslipidemia. ESRD on dialysis [facility]), Breast cancer (HCC), OSA                 (obstructive sleep apnea), Covid-19.  Sonographer:    [name] RCS Referring Phys: [id number] MAURICIO DANIEL ARRIEN IMPRESSIONS  1. Left ventricular ejection fraction, by estimation, is 60 to 65%. The left ventricle has normal function. The left ventricle has no regional wall motion abnormalities. There is mild concentric left ventricular hypertrophy. Left ventricular diastolic parameters are consistent with Grade I diastolic dysfunction (impaired relaxation).  2. Right ventricular systolic function is normal. The right ventricular size is normal. Tricuspid regurgitation signal is inadequate for assessing PA pressure.  3. No  evidence of mitral valve regurgitation.  4. The aortic valve is grossly normal. Aortic valve regurgitation is not visualized. Mild aortic valve stenosis.  5. The inferior vena cava is normal in size with greater than 50% respiratory variability, suggesting right atrial pressure of 3 mmHg. Comparison(s): No significant change from prior study. Conclusion(s)/Recommendation(s): No evidence of valvular vegetations on this transthoracic echocardiogram. Consider a transesophageal echocardiogram to exclude infective endocarditis if clinically indicated. FINDINGS  Left Ventricle: Left ventricular ejection fraction, by estimation, is 60 to 65%. The left ventricle has normal function. The left ventricle has no regional wall motion abnormalities. The left ventricular internal cavity size was normal in size. There is  mild concentric left ventricular hypertrophy. Left ventricular diastolic parameters are consistent with Grade I diastolic dysfunction (impaired relaxation). Right Ventricle: The right ventricular size is normal. Right ventricular systolic function is normal. Tricuspid regurgitation signal is inadequate for assessing PA pressure. Left Atrium: Left atrial size was normal in size. Right Atrium: Right atrial size was normal in size. Pericardium: There is no evidence of pericardial effusion. Mitral Valve: Mild mitral annular calcification. No evidence of mitral valve regurgitation. Tricuspid Valve: Tricuspid valve regurgitation is not demonstrated. Aortic Valve: The aortic valve is grossly normal. Aortic valve regurgitation is not visualized. Mild aortic stenosis is present. Aortic valve mean gradient measures 10.3 mmHg. Aortic valve peak gradient measures 21.8 mmHg. Aortic valve area, by VTI measures 1.93 cm. Pulmonic Valve: Pulmonic valve regurgitation is not visualized. Aorta: The aortic root is normal in size and structure. Venous: The inferior vena cava is normal in size with greater than 50% respiratory  variability, suggesting right atrial pressure of 3 mmHg. IAS/Shunts: The interatrial septum was not well visualized.  LEFT VENTRICLE PLAX 2D LVIDd:  Name:   [facility] Date of Exam: 09/15/2023 Medical Rec #:  [id number]   Height:       63.0 in Accession #:    [id number]  Weight:       245.0 lb Date of Birth:  Dec 27, 1970  BSA:          2.108 m Patient Age:    51 years    BP:           125132/4757 mmHg Patient Gender: F           HR:           93 bpm. [facility] Procedure: 2D Echo, Cardiac Doppler and Color Doppler Indications:    Fever R50.9  History:        Patient has prior history of Echocardiogram examinations, most                 recent 11/19/2022. CHF; Risk Factors:Hypertension, Diabetes and                 Dyslipidemia. ESRD on dialysis [facility]), Breast cancer (HCC), OSA                 (obstructive sleep apnea), Covid-19.  Sonographer:    [name] RCS Referring Phys: [id number] MAURICIO DANIEL ARRIEN IMPRESSIONS  1. Left ventricular ejection fraction, by estimation, is 60 to 65%. The left ventricle has normal function. The left ventricle has no regional wall motion abnormalities. There is mild concentric left ventricular hypertrophy. Left ventricular diastolic parameters are consistent with Grade I diastolic dysfunction (impaired relaxation).  2. Right ventricular systolic function is normal. The right ventricular size is normal. Tricuspid regurgitation signal is inadequate for assessing PA pressure.  3. No  evidence of mitral valve regurgitation.  4. The aortic valve is grossly normal. Aortic valve regurgitation is not visualized. Mild aortic valve stenosis.  5. The inferior vena cava is normal in size with greater than 50% respiratory variability, suggesting right atrial pressure of 3 mmHg. Comparison(s): No significant change from prior study. Conclusion(s)/Recommendation(s): No evidence of valvular vegetations on this transthoracic echocardiogram. Consider a transesophageal echocardiogram to exclude infective endocarditis if clinically indicated. FINDINGS  Left Ventricle: Left ventricular ejection fraction, by estimation, is 60 to 65%. The left ventricle has normal function. The left ventricle has no regional wall motion abnormalities. The left ventricular internal cavity size was normal in size. There is  mild concentric left ventricular hypertrophy. Left ventricular diastolic parameters are consistent with Grade I diastolic dysfunction (impaired relaxation). Right Ventricle: The right ventricular size is normal. Right ventricular systolic function is normal. Tricuspid regurgitation signal is inadequate for assessing PA pressure. Left Atrium: Left atrial size was normal in size. Right Atrium: Right atrial size was normal in size. Pericardium: There is no evidence of pericardial effusion. Mitral Valve: Mild mitral annular calcification. No evidence of mitral valve regurgitation. Tricuspid Valve: Tricuspid valve regurgitation is not demonstrated. Aortic Valve: The aortic valve is grossly normal. Aortic valve regurgitation is not visualized. Mild aortic stenosis is present. Aortic valve mean gradient measures 10.3 mmHg. Aortic valve peak gradient measures 21.8 mmHg. Aortic valve area, by VTI measures 1.93 cm. Pulmonic Valve: Pulmonic valve regurgitation is not visualized. Aorta: The aortic root is normal in size and structure. Venous: The inferior vena cava is normal in size with greater than 50% respiratory  variability, suggesting right atrial pressure of 3 mmHg. IAS/Shunts: The interatrial septum was not well visualized.  LEFT VENTRICLE PLAX 2D LVIDd:  Cruciates: Within the limitations of motion artifact, the ACL and PCL appear intact. Collaterals: There is mild thickening and mild intermediate T2 signal within the proximal anterior aspect of the medial collateral ligament (axial series 8 images 12 and 13, coronal series 11 image 14. Mild-to-moderate edema around the anteromedial aspect of the medial collateral ligament. The fibular collateral ligament, biceps femoris tendon, iliotibial band, and popliteus tendon are intact. CARTILAGE Patellofemoral: Within the limitations of motion artifact, no definite cartilage defect is seen. Medial: Moderate thinning of the posterior non weight-bearing medial femoral condyle cartilage with multiple subchondral cysts (sagittal series 17, image 22 and coronal series 13, image 14). Lateral:  No cartilage defect is seen. Joint: Nojoint effusion. Normal  Hoffa's fat pad. No plical thickening. Popliteal Fossa:  No Baker's cyst. Extensor Mechanism:  Intact quadriceps tendon and patellar tendon. Bones:  No acute fracture or dislocation. No definite sinus tract is seen. No cortical erosion is seen. Other: None. IMPRESSION: 1. 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 This appears to contact the inferior aspect of the posterior wall of the posterior horn of the medial meniscus, however no tear is seen extending through an articular surface of the medial meniscus. 2. Intrasubstance degeneration throughout the root of the anterior horn of the lateral meniscus. No tear is seen extending through an articular surface of the lateral meniscus. 3. Mild proximal anteromedial collateral ligament sprain with mild surrounding soft tissue edema. 4. Moderate thinning of the posterior non weight-bearing medial femoral condyle cartilage with multiple subchondral cysts. Of acute 5. No MRI evidence acute osteomyelitis. Electronically Signed   By: Neita Garnet M.D.   On: 09/20/2023 20:58   DG Knee 1-2 Views Right  Result Date: 09/18/2023 CLINICAL DATA:  Right knee pain.  No known injury. EXAM: RIGHT KNEE - 1-2 VIEW COMPARISON:  None Available. FINDINGS: There is diffuse decreased bone mineralization. No joint effusion. Joint spaces are preserved. No acute fracture or dislocation. High-grade atherosclerotic calcifications. IMPRESSION: 1. No significant osteoarthritis. 2. No acute fracture or dislocation. 3. High-grade atherosclerotic calcifications. Electronically Signed   By: Neita Garnet M.D.   On: 09/18/2023 17:59   ECHO TEE  Result Date: 09/18/2023    TRANSESOPHOGEAL ECHO REPORT   Patient Name:   [facility] [place] Date of Exam: 09/18/2023 Medical Rec #:  [id number]   Height:       63.0 in Accession #:    [id number]  Weight:       247.1 lb Date of Birth:  May 07, 1971  BSA:          2.116 m Patient Age:     51 years    BP:           165/81 mmHg Patient Gender: F           HR:           91 bpm. [facility] Procedure: Transesophageal Echo, Cardiac Doppler and Color Doppler Indications:    Bacteremia R78.81  History:        Patient has prior history of Echocardiogram examinations, most                 recent 09/15/2023. CHF; Risk Factors:Hypertension, Diabetes and                 Dyslipidemia. ESRD on dialysis [facility]), Breast cancer (HCC), OSA                 (  Cruciates: Within the limitations of motion artifact, the ACL and PCL appear intact. Collaterals: There is mild thickening and mild intermediate T2 signal within the proximal anterior aspect of the medial collateral ligament (axial series 8 images 12 and 13, coronal series 11 image 14. Mild-to-moderate edema around the anteromedial aspect of the medial collateral ligament. The fibular collateral ligament, biceps femoris tendon, iliotibial band, and popliteus tendon are intact. CARTILAGE Patellofemoral: Within the limitations of motion artifact, no definite cartilage defect is seen. Medial: Moderate thinning of the posterior non weight-bearing medial femoral condyle cartilage with multiple subchondral cysts (sagittal series 17, image 22 and coronal series 13, image 14). Lateral:  No cartilage defect is seen. Joint: Nojoint effusion. Normal  Hoffa's fat pad. No plical thickening. Popliteal Fossa:  No Baker's cyst. Extensor Mechanism:  Intact quadriceps tendon and patellar tendon. Bones:  No acute fracture or dislocation. No definite sinus tract is seen. No cortical erosion is seen. Other: None. IMPRESSION: 1. 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 This appears to contact the inferior aspect of the posterior wall of the posterior horn of the medial meniscus, however no tear is seen extending through an articular surface of the medial meniscus. 2. Intrasubstance degeneration throughout the root of the anterior horn of the lateral meniscus. No tear is seen extending through an articular surface of the lateral meniscus. 3. Mild proximal anteromedial collateral ligament sprain with mild surrounding soft tissue edema. 4. Moderate thinning of the posterior non weight-bearing medial femoral condyle cartilage with multiple subchondral cysts. Of acute 5. No MRI evidence acute osteomyelitis. Electronically Signed   By: Neita Garnet M.D.   On: 09/20/2023 20:58   DG Knee 1-2 Views Right  Result Date: 09/18/2023 CLINICAL DATA:  Right knee pain.  No known injury. EXAM: RIGHT KNEE - 1-2 VIEW COMPARISON:  None Available. FINDINGS: There is diffuse decreased bone mineralization. No joint effusion. Joint spaces are preserved. No acute fracture or dislocation. High-grade atherosclerotic calcifications. IMPRESSION: 1. No significant osteoarthritis. 2. No acute fracture or dislocation. 3. High-grade atherosclerotic calcifications. Electronically Signed   By: Neita Garnet M.D.   On: 09/18/2023 17:59   ECHO TEE  Result Date: 09/18/2023    TRANSESOPHOGEAL ECHO REPORT   Patient Name:   [facility] Date of Exam: 09/18/2023 Medical Rec #:  [id number]   Height:       63.0 in Accession #:    [id number]  Weight:       247.1 lb Date of Birth:  May 07, 1971  BSA:          2.116 m Patient Age:     51 years    BP:           165/81 mmHg Patient Gender: F           HR:           91 bpm. [facility] Procedure: Transesophageal Echo, Cardiac Doppler and Color Doppler Indications:    Bacteremia R78.81  History:        Patient has prior history of Echocardiogram examinations, most                 recent 09/15/2023. CHF; Risk Factors:Hypertension, Diabetes and                 Dyslipidemia. ESRD on dialysis [facility]), Breast cancer (HCC), OSA                 (  PROGRESS NOTE  Patricia [name] ZOX:[id number] DOB: 1971-04-01 DOA: 09/14/2023 PCP: Patricia Epley, PA-C  Brief History:  As per H&P written by Dr. [name] on 09/14/2023 Patricia [name] is a 52 y.o. female with medical history significant of ESRD on hemodialysis, (M,W,F), obesity class 3, T2DM, hypertension and asthma who presented with dyspnea, fevers and left neck pain.    Patient has a left upper extremity fistula that has been malfunctioning, she underwent Fistulogram on 08.27.24 by Dr [name] and then had a follow up with with vascular surgery in [facility] 09/03/23, plan for further follow up with vascular ultrasound.  In the meanwhile she has been getting HD through a tunneled catheter in her left internal jugular vein.    Patient was noted to have a low grade fever on Wednesday, 09/25 during dialysis. She was treated with acetaminophen and completed HD. During that session, initially her left upper extremity fistula was used but she developed pain and discomfort, not able to complete HD by this route, then completed her treatment through her HD catheter.    The following day she felt a little better, but still not back to baseline, she continue having chills and generalized malaise.  Yesterday she had several episodes of vomiting prior to HD. She was medicated with ondansetron, acetaminophen and lomotil. She was able to complete her HD treatment through her HD catheter.    At home she continue to have chills and now developed pain in her left shoulder area, radiating into her head and left ear. No improving factors.  The patient was found to have MSSA bacteremia.  She was started on cefazolin.  Her tunneled dialysis catheter was removed on 09/16/2023 and a temporary dialysis catheter was placed after a line holiday.  TEE was negative for vegetations.  Subsequently, her repeat blood cultures remained negative.  IR was reconsulted for placement of a new tunneled Alysis catheter.  The patient  began developing some intermittent vomiting.  She stated that she did not have a bowel movement in several days.  CT of the abdomen and pelvis was ordered.   Assessment/Plan:  Severe sepsis secondary to MSSA bacteremia (HCC) -Severe sepsis with elevated leukocytosis, lactic acid, present on admission.  -Continue IV antibiotics--Ancef -Old Dialysis catheter has been removed 9/30 -10/2 TEE--no vegetations -Stable vital signs. -9/30 repeat blood cultures>>neg x 5 days -09/19/23 blood cultures--neg to date -plan to d/c with cefazolin on HD MWF with last dose on 10/30/23 -hopeful for new HD tunneled catheter on 10/6 or 10/7   Acute on chronic hypoxemic and hypercapnic respiratory failure. (Sepsis related).  -Continue supplemental 02  -chronically on 2L at home -currently on 4L>>weaned to 2L -Continue CPAP nightly. -9/30 CXR - vascular congestion -10/29/22 CT chest high res--Regions of cryptogenic organizing pneumonia (COP) suspected as well as possible ILD   ESRD on dialysis [facility]) -Patient chronically Monday, Wednesday and Friday for dialysis purposes -Last treatment on 09/16/2023 -Nephrology service consulted and will follow recommendations. -Patient's hemodialysis catheter successfully removed after dialysis treatment on 09/16/2023. -10/3--temporary HD catheter placed by gen surg--appreciate Dr. [name] -10/3 and 10/5--last HD; -hopefully can have tunneled HD cath done mon/tues   Nausea and vomiting/Abd pain -suspect gastroparesis initially -now with no BM and abd pain -CT abd/pelvis--no obstruction; moderate retained stool transverse, descending and rectosigmoid colon;    -around the clock antiemetics -pantoprazole bid  Pulmonary nodules -incidental findings on CT abd/pelvis -will need outpatient surveillance   Right Knee pain -  PROGRESS NOTE  Patricia [name] ZOX:[id number] DOB: 1971-04-01 DOA: 09/14/2023 PCP: Patricia Epley, PA-C  Brief History:  As per H&P written by Dr. [name] on 09/14/2023 Patricia [name] is a 52 y.o. female with medical history significant of ESRD on hemodialysis, (M,W,F), obesity class 3, T2DM, hypertension and asthma who presented with dyspnea, fevers and left neck pain.    Patient has a left upper extremity fistula that has been malfunctioning, she underwent Fistulogram on 08.27.24 by Dr [name] and then had a follow up with with vascular surgery in [facility] 09/03/23, plan for further follow up with vascular ultrasound.  In the meanwhile she has been getting HD through a tunneled catheter in her left internal jugular vein.    Patient was noted to have a low grade fever on Wednesday, 09/25 during dialysis. She was treated with acetaminophen and completed HD. During that session, initially her left upper extremity fistula was used but she developed pain and discomfort, not able to complete HD by this route, then completed her treatment through her HD catheter.    The following day she felt a little better, but still not back to baseline, she continue having chills and generalized malaise.  Yesterday she had several episodes of vomiting prior to HD. She was medicated with ondansetron, acetaminophen and lomotil. She was able to complete her HD treatment through her HD catheter.    At home she continue to have chills and now developed pain in her left shoulder area, radiating into her head and left ear. No improving factors.  The patient was found to have MSSA bacteremia.  She was started on cefazolin.  Her tunneled dialysis catheter was removed on 09/16/2023 and a temporary dialysis catheter was placed after a line holiday.  TEE was negative for vegetations.  Subsequently, her repeat blood cultures remained negative.  IR was reconsulted for placement of a new tunneled Alysis catheter.  The patient  began developing some intermittent vomiting.  She stated that she did not have a bowel movement in several days.  CT of the abdomen and pelvis was ordered.   Assessment/Plan:  Severe sepsis secondary to MSSA bacteremia (HCC) -Severe sepsis with elevated leukocytosis, lactic acid, present on admission.  -Continue IV antibiotics--Ancef -Old Dialysis catheter has been removed 9/30 -10/2 TEE--no vegetations -Stable vital signs. -9/30 repeat blood cultures>>neg x 5 days -09/19/23 blood cultures--neg to date -plan to d/c with cefazolin on HD MWF with last dose on 10/30/23 -hopeful for new HD tunneled catheter on 10/6 or 10/7   Acute on chronic hypoxemic and hypercapnic respiratory failure. (Sepsis related).  -Continue supplemental 02  -chronically on 2L at home -currently on 4L>>weaned to 2L -Continue CPAP nightly. -9/30 CXR - vascular congestion -10/29/22 CT chest high res--Regions of cryptogenic organizing pneumonia (COP) suspected as well as possible ILD   ESRD on dialysis [facility]) -Patient chronically Monday, Wednesday and Friday for dialysis purposes -Last treatment on 09/16/2023 -Nephrology service consulted and will follow recommendations. -Patient's hemodialysis catheter successfully removed after dialysis treatment on 09/16/2023. -10/3--temporary HD catheter placed by gen surg--appreciate Dr. [name] -10/3 and 10/5--last HD; -hopefully can have tunneled HD cath done mon/tues   Nausea and vomiting/Abd pain -suspect gastroparesis initially -now with no BM and abd pain -CT abd/pelvis--no obstruction; moderate retained stool transverse, descending and rectosigmoid colon;    -around the clock antiemetics -pantoprazole bid  Pulmonary nodules -incidental findings on CT abd/pelvis -will need outpatient surveillance   Right Knee pain -  Name:   [facility] Date of Exam: 09/15/2023 Medical Rec #:  [id number]   Height:       63.0 in Accession #:    [id number]  Weight:       245.0 lb Date of Birth:  Dec 27, 1970  BSA:          2.108 m Patient Age:    51 years    BP:           125132/4757 mmHg Patient Gender: F           HR:           93 bpm. [facility] Procedure: 2D Echo, Cardiac Doppler and Color Doppler Indications:    Fever R50.9  History:        Patient has prior history of Echocardiogram examinations, most                 recent 11/19/2022. CHF; Risk Factors:Hypertension, Diabetes and                 Dyslipidemia. ESRD on dialysis [facility]), Breast cancer (HCC), OSA                 (obstructive sleep apnea), Covid-19.  Sonographer:    [name] RCS Referring Phys: [id number] MAURICIO DANIEL ARRIEN IMPRESSIONS  1. Left ventricular ejection fraction, by estimation, is 60 to 65%. The left ventricle has normal function. The left ventricle has no regional wall motion abnormalities. There is mild concentric left ventricular hypertrophy. Left ventricular diastolic parameters are consistent with Grade I diastolic dysfunction (impaired relaxation).  2. Right ventricular systolic function is normal. The right ventricular size is normal. Tricuspid regurgitation signal is inadequate for assessing PA pressure.  3. No  evidence of mitral valve regurgitation.  4. The aortic valve is grossly normal. Aortic valve regurgitation is not visualized. Mild aortic valve stenosis.  5. The inferior vena cava is normal in size with greater than 50% respiratory variability, suggesting right atrial pressure of 3 mmHg. Comparison(s): No significant change from prior study. Conclusion(s)/Recommendation(s): No evidence of valvular vegetations on this transthoracic echocardiogram. Consider a transesophageal echocardiogram to exclude infective endocarditis if clinically indicated. FINDINGS  Left Ventricle: Left ventricular ejection fraction, by estimation, is 60 to 65%. The left ventricle has normal function. The left ventricle has no regional wall motion abnormalities. The left ventricular internal cavity size was normal in size. There is  mild concentric left ventricular hypertrophy. Left ventricular diastolic parameters are consistent with Grade I diastolic dysfunction (impaired relaxation). Right Ventricle: The right ventricular size is normal. Right ventricular systolic function is normal. Tricuspid regurgitation signal is inadequate for assessing PA pressure. Left Atrium: Left atrial size was normal in size. Right Atrium: Right atrial size was normal in size. Pericardium: There is no evidence of pericardial effusion. Mitral Valve: Mild mitral annular calcification. No evidence of mitral valve regurgitation. Tricuspid Valve: Tricuspid valve regurgitation is not demonstrated. Aortic Valve: The aortic valve is grossly normal. Aortic valve regurgitation is not visualized. Mild aortic stenosis is present. Aortic valve mean gradient measures 10.3 mmHg. Aortic valve peak gradient measures 21.8 mmHg. Aortic valve area, by VTI measures 1.93 cm. Pulmonic Valve: Pulmonic valve regurgitation is not visualized. Aorta: The aortic root is normal in size and structure. Venous: The inferior vena cava is normal in size with greater than 50% respiratory  variability, suggesting right atrial pressure of 3 mmHg. IAS/Shunts: The interatrial septum was not well visualized.  LEFT VENTRICLE PLAX 2D LVIDd:  Name:   [facility] Date of Exam: 09/15/2023 Medical Rec #:  [id number]   Height:       63.0 in Accession #:    [id number]  Weight:       245.0 lb Date of Birth:  Dec 27, 1970  BSA:          2.108 m Patient Age:    51 years    BP:           125132/4757 mmHg Patient Gender: F           HR:           93 bpm. [facility] Procedure: 2D Echo, Cardiac Doppler and Color Doppler Indications:    Fever R50.9  History:        Patient has prior history of Echocardiogram examinations, most                 recent 11/19/2022. CHF; Risk Factors:Hypertension, Diabetes and                 Dyslipidemia. ESRD on dialysis [facility]), Breast cancer (HCC), OSA                 (obstructive sleep apnea), Covid-19.  Sonographer:    [name] RCS Referring Phys: [id number] MAURICIO DANIEL ARRIEN IMPRESSIONS  1. Left ventricular ejection fraction, by estimation, is 60 to 65%. The left ventricle has normal function. The left ventricle has no regional wall motion abnormalities. There is mild concentric left ventricular hypertrophy. Left ventricular diastolic parameters are consistent with Grade I diastolic dysfunction (impaired relaxation).  2. Right ventricular systolic function is normal. The right ventricular size is normal. Tricuspid regurgitation signal is inadequate for assessing PA pressure.  3. No  evidence of mitral valve regurgitation.  4. The aortic valve is grossly normal. Aortic valve regurgitation is not visualized. Mild aortic valve stenosis.  5. The inferior vena cava is normal in size with greater than 50% respiratory variability, suggesting right atrial pressure of 3 mmHg. Comparison(s): No significant change from prior study. Conclusion(s)/Recommendation(s): No evidence of valvular vegetations on this transthoracic echocardiogram. Consider a transesophageal echocardiogram to exclude infective endocarditis if clinically indicated. FINDINGS  Left Ventricle: Left ventricular ejection fraction, by estimation, is 60 to 65%. The left ventricle has normal function. The left ventricle has no regional wall motion abnormalities. The left ventricular internal cavity size was normal in size. There is  mild concentric left ventricular hypertrophy. Left ventricular diastolic parameters are consistent with Grade I diastolic dysfunction (impaired relaxation). Right Ventricle: The right ventricular size is normal. Right ventricular systolic function is normal. Tricuspid regurgitation signal is inadequate for assessing PA pressure. Left Atrium: Left atrial size was normal in size. Right Atrium: Right atrial size was normal in size. Pericardium: There is no evidence of pericardial effusion. Mitral Valve: Mild mitral annular calcification. No evidence of mitral valve regurgitation. Tricuspid Valve: Tricuspid valve regurgitation is not demonstrated. Aortic Valve: The aortic valve is grossly normal. Aortic valve regurgitation is not visualized. Mild aortic stenosis is present. Aortic valve mean gradient measures 10.3 mmHg. Aortic valve peak gradient measures 21.8 mmHg. Aortic valve area, by VTI measures 1.93 cm. Pulmonic Valve: Pulmonic valve regurgitation is not visualized. Aorta: The aortic root is normal in size and structure. Venous: The inferior vena cava is normal in size with greater than 50% respiratory  variability, suggesting right atrial pressure of 3 mmHg. IAS/Shunts: The interatrial septum was not well visualized.  LEFT VENTRICLE PLAX 2D LVIDd:

## 2023-09-23 NOTE — Procedures (Signed)
   Nephrology Nursing Note:  Last HD Sat 10/5.  After discussion with Dr. Marisue Humble, HD has been rescheduled for tomorrow, Tues 10/8, after tunneled catheter placement.   Arman Filter, RN

## 2023-09-24 ENCOUNTER — Encounter (HOSPITAL_COMMUNITY): Payer: Self-pay | Admitting: Radiology

## 2023-09-24 ENCOUNTER — Ambulatory Visit (HOSPITAL_COMMUNITY)
Admission: RE | Admit: 2023-09-24 | Discharge: 2023-09-24 | Disposition: A | Discharge status: Home / Self Care | Payer: Medicare Other | Source: Ambulatory Visit | Attending: Internal Medicine | Admitting: Internal Medicine

## 2023-09-24 ENCOUNTER — Inpatient Hospital Stay (HOSPITAL_COMMUNITY): Payer: Medicare Other

## 2023-09-24 ENCOUNTER — Other Ambulatory Visit: Payer: Self-pay | Admitting: *Deleted

## 2023-09-24 DIAGNOSIS — I120 Hypertensive chronic kidney disease with stage 5 chronic kidney disease or end stage renal disease: Secondary | ICD-10-CM | POA: Insufficient documentation

## 2023-09-24 DIAGNOSIS — Z794 Long term (current) use of insulin: Secondary | ICD-10-CM | POA: Insufficient documentation

## 2023-09-24 DIAGNOSIS — R6520 Severe sepsis without septic shock: Secondary | ICD-10-CM

## 2023-09-24 DIAGNOSIS — E1122 Type 2 diabetes mellitus with diabetic chronic kidney disease: Secondary | ICD-10-CM | POA: Insufficient documentation

## 2023-09-24 DIAGNOSIS — Z992 Dependence on renal dialysis: Secondary | ICD-10-CM | POA: Insufficient documentation

## 2023-09-24 DIAGNOSIS — A419 Sepsis, unspecified organism: Secondary | ICD-10-CM | POA: Diagnosis not present

## 2023-09-24 DIAGNOSIS — N19 Unspecified kidney failure: Secondary | ICD-10-CM | POA: Diagnosis not present

## 2023-09-24 DIAGNOSIS — Z7984 Long term (current) use of oral hypoglycemic drugs: Secondary | ICD-10-CM | POA: Insufficient documentation

## 2023-09-24 DIAGNOSIS — N186 End stage renal disease: Secondary | ICD-10-CM | POA: Insufficient documentation

## 2023-09-24 DIAGNOSIS — E66813 Obesity, class 3: Secondary | ICD-10-CM | POA: Diagnosis not present

## 2023-09-24 HISTORY — PX: IR US GUIDE VASC ACCESS RIGHT: IMG2390

## 2023-09-24 HISTORY — PX: IR PATIENT EVAL TECH 0-60 MINS: IMG5564

## 2023-09-24 HISTORY — PX: IR FLUORO GUIDE CV LINE RIGHT: IMG2283

## 2023-09-24 LAB — CBC
HCT: 26 % — ABNORMAL LOW (ref 36.0–46.0)
Hemoglobin: 8.1 g/dL — ABNORMAL LOW (ref 12.0–15.0)
MCH: 27.8 pg (ref 26.0–34.0)
MCHC: 31.2 g/dL (ref 30.0–36.0)
MCV: 89.3 fL (ref 80.0–100.0)
Platelets: 378 10*3/uL (ref 150–400)
RBC: 2.91 MIL/uL — ABNORMAL LOW (ref 3.87–5.11)
RDW: 14.2 % (ref 11.5–15.5)
WBC: 13.3 10*3/uL — ABNORMAL HIGH (ref 4.0–10.5)
nRBC: 0 % (ref 0.0–0.2)

## 2023-09-24 LAB — CULTURE, BLOOD (ROUTINE X 2)
Culture: NO GROWTH
Culture: NO GROWTH
Special Requests: ADEQUATE

## 2023-09-24 LAB — RENAL FUNCTION PANEL
Albumin: 3.5 g/dL (ref 3.5–5.0)
Anion gap: 20 — ABNORMAL HIGH (ref 5–15)
BUN: 86 mg/dL — ABNORMAL HIGH (ref 6–20)
CO2: 20 mmol/L — ABNORMAL LOW (ref 22–32)
Calcium: 9.5 mg/dL (ref 8.9–10.3)
Chloride: 91 mmol/L — ABNORMAL LOW (ref 98–111)
Creatinine, Ser: 15.73 mg/dL — ABNORMAL HIGH (ref 0.44–1.00)
GFR, Estimated: 2 mL/min — ABNORMAL LOW (ref >60)
Glucose, Bld: 218 mg/dL — ABNORMAL HIGH (ref 70–99)
Phosphorus: 7.1 mg/dL — ABNORMAL HIGH (ref 2.5–4.6)
Potassium: 4.8 mmol/L (ref 3.5–5.1)
Sodium: 131 mmol/L — ABNORMAL LOW (ref 135–145)

## 2023-09-24 LAB — GLUCOSE, CAPILLARY
Glucose-Capillary: 204 mg/dL — ABNORMAL HIGH (ref 70–99)
Glucose-Capillary: 179 mg/dL — ABNORMAL HIGH (ref 70–99)

## 2023-09-24 MED ORDER — CEFAZOLIN SODIUM-DEXTROSE 2-4 GM/100ML-% IV SOLN
INTRAVENOUS | Status: AC | PRN
Start: 2023-09-24 — End: 2023-09-24
  Administered 2023-09-24: 2 g via INTRAVENOUS

## 2023-09-24 MED ORDER — FENTANYL CITRATE (PF) 100 MCG/2ML IJ SOLN
INTRAMUSCULAR | Status: AC
  Filled 2023-09-24: qty 2

## 2023-09-24 MED ORDER — HEPARIN SODIUM (PORCINE) 1000 UNIT/ML IJ SOLN
10000.0000 [IU] | Freq: Once | INTRAMUSCULAR | Status: AC
  Administered 2023-09-24: 3200 [IU]

## 2023-09-24 MED ORDER — LIDOCAINE-EPINEPHRINE 1 %-1:100000 IJ SOLN
INTRAMUSCULAR | Status: AC
  Filled 2023-09-24: qty 1

## 2023-09-24 MED ORDER — CEFAZOLIN SODIUM-DEXTROSE 2-4 GM/100ML-% IV SOLN
2.0000 g | INTRAVENOUS | Status: AC
  Administered 2023-09-24: 2 g via INTRAVENOUS
  Filled 2023-09-24: qty 100

## 2023-09-24 MED ORDER — MIDAZOLAM HCL 2 MG/2ML IJ SOLN
INTRAMUSCULAR | Status: AC
  Filled 2023-09-24: qty 4

## 2023-09-24 MED ORDER — HEPARIN SODIUM (PORCINE) 1000 UNIT/ML IJ SOLN
INTRAMUSCULAR | Status: AC
  Filled 2023-09-24: qty 10

## 2023-09-24 MED ORDER — LIDOCAINE-EPINEPHRINE 1 %-1:100000 IJ SOLN
20.0000 mL | Freq: Once | INTRAMUSCULAR | Status: AC
  Administered 2023-09-24: 8 mL via INTRADERMAL

## 2023-09-24 MED ORDER — CEFAZOLIN SODIUM-DEXTROSE 2-4 GM/100ML-% IV SOLN
INTRAVENOUS | Status: AC
  Filled 2023-09-24: qty 100

## 2023-09-24 MED ORDER — SEVELAMER CARBONATE 800 MG PO TABS
ORAL_TABLET | ORAL | Status: AC
  Filled 2023-09-24: qty 3

## 2023-09-24 MED ORDER — CEFAZOLIN SODIUM-DEXTROSE 2-4 GM/100ML-% IV SOLN: 2.0000 g | INTRAVENOUS | Status: DC

## 2023-09-24 MED ORDER — OXYCODONE HCL 5 MG PO TABS: 5.0000 mg | ORAL_TABLET | Freq: Four times a day (QID) | ORAL | 0 refills | Status: AC | PRN

## 2023-09-24 MED ORDER — CEFAZOLIN SODIUM-DEXTROSE 2-4 GM/100ML-% IV SOLN: 2.0000 g | Freq: Once | INTRAVENOUS | Status: DC

## 2023-09-24 MED ORDER — MIDAZOLAM HCL 2 MG/2ML IJ SOLN
INTRAMUSCULAR | Status: AC | PRN
Start: 2023-09-24 — End: 2023-09-24
  Administered 2023-09-24: 1 mg via INTRAVENOUS

## 2023-09-24 MED ORDER — HEPARIN SODIUM (PORCINE) 1000 UNIT/ML IJ SOLN
INTRAMUSCULAR | Status: AC
  Filled 2023-09-24: qty 6

## 2023-09-24 MED ORDER — FENTANYL CITRATE (PF) 100 MCG/2ML IJ SOLN
INTRAMUSCULAR | Status: AC | PRN
Start: 2023-09-24 — End: 2023-09-24
  Administered 2023-09-24: 50 ug via INTRAVENOUS

## 2023-09-24 MED ORDER — OXYCODONE HCL 5 MG PO TABS: 5.0000 mg | ORAL_TABLET | Freq: Four times a day (QID) | ORAL | Status: DC | PRN

## 2023-09-24 NOTE — Discharge Summary (Addendum)
Physician Discharge Summary   Patient: Patricia [name] MRN: [id number] DOB: November 19, 1971  Admit date:     09/14/2023  Discharge date: 09/24/23  Discharge Physician: Onalee Hua Rufina Kimery   PCP: [name], PA-C   Recommendations at discharge:   Please follow up with primary care provider within 1-2 weeks  Please repeat BMP and CBC in one week     Hospital Course: As per H&P written by Dr. [name] on 09/14/2023 Patricia [name] is a 52 y.o. female with medical history significant of ESRD on hemodialysis, (M,W,F), obesity class 3, T2DM, hypertension and asthma who presented with dyspnea, fevers and left neck pain.    Patient has a left upper extremity fistula that has been malfunctioning, she underwent Fistulogram on 08.27.24 by Dr [name] and then had a follow up with with vascular surgery in [facility] 09/03/23, plan for further follow up with vascular ultrasound.  In the meanwhile she has been getting HD through a tunneled catheter in her left internal jugular vein.    Patient was noted to have a low grade fever on Wednesday, 09/25 during dialysis. She was treated with acetaminophen and completed HD. During that session, initially her left upper extremity fistula was used but she developed pain and discomfort, not able to complete HD by this route, then completed her treatment through her HD catheter.    The following day she felt a little better, but still not back to baseline, she continue having chills and generalized malaise.  Yesterday she had several episodes of vomiting prior to HD. She was medicated with ondansetron, acetaminophen and lomotil. She was able to complete her HD treatment through her HD catheter.    At home she continue to have chills and now developed pain in her left shoulder area, radiating into her head and left ear. No improving factors.  The patient was found to have MSSA bacteremia.  She was started on cefazolin.  Her tunneled dialysis catheter was removed on 09/16/2023 and a  temporary dialysis catheter was placed after a line holiday.  TEE was negative for vegetations.  Subsequently, her repeat blood cultures remained negative.  IR was reconsulted for placement of a new tunneled Alysis catheter.  The patient began developing some intermittent vomiting.  She stated that she did not have a bowel movement in several days.  CT of the abdomen and pelvis was ordered.  It was neg for obstruction and showed moderate stool/constipation.  She was given a SMOG enema with good results and improvement of her abd pain.  IR placed new tunneled HD catheter on 09/24/23 and pt received HD thereafter.  She will d/c to continue cefazolin 2 grams with each HD, last dose on 10/30/23.  Assessment and Plan:   Severe sepsis secondary to MSSA bacteremia (HCC) -Severe sepsis with elevated leukocytosis, lactic acid, present on admission.  -Continue IV antibiotics--Ancef -Old Dialysis catheter has been removed 9/30 -10/2 TEE--no vegetations -Stable vital signs. -9/30 repeat blood cultures>>neg x 5 days -09/19/23 blood cultures--neg to date -plan to d/c with cefazolin on HD MWF with last dose on 10/30/23 -new HD tunneled catheter placed 10/8 and temporarily HD catheter removed prior to d/c   Acute on chronic hypoxemic and hypercapnic respiratory failure. (Sepsis related).  -Continue supplemental 02  -chronically on 2L at home -currently on 4L>>weaned to 2L -Continue CPAP nightly. -9/30 CXR - vascular congestion -10/29/22 CT chest high res--Regions of cryptogenic organizing pneumonia (COP) suspected as well as possible ILD   ESRD on dialysis [facility]) -Patient chronically  Doppler performed. Nona Dell MD Electronically signed by Nona Dell MD Signature Date/Time: 09/18/2023/12:57:59 PM    Final    DG Chest Port 1 View  Result Date: 09/16/2023 CLINICAL DATA:  Encounter removal of vascular catheter. EXAM: PORTABLE CHEST 1 VIEW COMPARISON:  Radiograph 06/14/2023 FINDINGS: The previous left-sided dialysis catheter is been removed. No evidence of pneumothorax. Soft tissue attenuation from habitus limits detailed assessment. Cardiomegaly is stable. Suspected vascular congestion. No large pleural effusion. IMPRESSION: 1. The left-sided dialysis catheter has been removed. No evidence of pneumothorax. 2. Cardiomegaly with vascular congestion. Electronically Signed   By: Patricia [name] M.D.   On: 09/16/2023 19:14   ECHOCARDIOGRAM COMPLETE  Result Date: 09/15/2023    ECHOCARDIOGRAM REPORT   Patient Name:   [facility] Date of Exam: 09/15/2023 Medical Rec #:  [id number]   Height:       63.0 in Accession #:    [id number]  Weight:       245.0 lb Date of Birth:  [birth year]  BSA:          2.108 m Patient Age:    51 years    BP:           125132/4757 mmHg Patient Gender: F           HR:           93 bpm. [facility] Procedure: 2D Echo, Cardiac Doppler and Color Doppler  Indications:    Fever R50.9  History:        Patient has prior history of Echocardiogram examinations, most                 recent 11/19/2022. CHF; Risk Factors:Hypertension, Diabetes and                 Dyslipidemia. ESRD on dialysis [facility]), Breast cancer (HCC), OSA                 (obstructive sleep apnea), Covid-19.  Sonographer:    [name] RCS Referring Phys: [id number] Patricia DANIEL ARRIEN IMPRESSIONS  1. Left ventricular ejection fraction, by estimation, is 60 to 65%. The left ventricle has normal function. The left ventricle has no regional wall motion abnormalities. There is mild concentric left ventricular hypertrophy. Left ventricular diastolic parameters are consistent with Grade I diastolic dysfunction (impaired relaxation).  2. Right ventricular systolic function is normal. The right ventricular size is normal. Tricuspid regurgitation signal is inadequate for assessing PA pressure.  3. No evidence of mitral valve regurgitation.  4. The aortic valve is grossly normal. Aortic valve regurgitation is not visualized. Mild aortic valve stenosis.  5. The inferior vena cava is normal in size with greater than 50% respiratory variability, suggesting right atrial pressure of 3 mmHg. Comparison(s): No significant change from prior study. Conclusion(s)/Recommendation(s): No evidence of valvular vegetations on this transthoracic echocardiogram. Consider a transesophageal echocardiogram to exclude infective endocarditis if clinically indicated. FINDINGS  Left Ventricle: Left ventricular ejection fraction, by estimation, is 60 to 65%. The left ventricle has normal function. The left ventricle has no regional wall motion abnormalities. The left ventricular internal cavity size was normal in size. There is  mild concentric left ventricular hypertrophy. Left ventricular diastolic parameters are consistent with Grade I diastolic dysfunction (impaired relaxation). Right Ventricle: The right ventricular size is normal.  Right ventricular systolic function is normal. Tricuspid regurgitation signal is inadequate for assessing PA pressure. Left Atrium: Left atrial size was normal in size. Right Atrium: Right atrial size  Doppler performed. Nona Dell MD Electronically signed by Nona Dell MD Signature Date/Time: 09/18/2023/12:57:59 PM    Final    DG Chest Port 1 View  Result Date: 09/16/2023 CLINICAL DATA:  Encounter removal of vascular catheter. EXAM: PORTABLE CHEST 1 VIEW COMPARISON:  Radiograph 06/14/2023 FINDINGS: The previous left-sided dialysis catheter is been removed. No evidence of pneumothorax. Soft tissue attenuation from habitus limits detailed assessment. Cardiomegaly is stable. Suspected vascular congestion. No large pleural effusion. IMPRESSION: 1. The left-sided dialysis catheter has been removed. No evidence of pneumothorax. 2. Cardiomegaly with vascular congestion. Electronically Signed   By: Patricia [name] M.D.   On: 09/16/2023 19:14   ECHOCARDIOGRAM COMPLETE  Result Date: 09/15/2023    ECHOCARDIOGRAM REPORT   Patient Name:   [facility] Date of Exam: 09/15/2023 Medical Rec #:  [id number]   Height:       63.0 in Accession #:    [id number]  Weight:       245.0 lb Date of Birth:  [birth year]  BSA:          2.108 m Patient Age:    51 years    BP:           125132/4757 mmHg Patient Gender: F           HR:           93 bpm. [facility] Procedure: 2D Echo, Cardiac Doppler and Color Doppler  Indications:    Fever R50.9  History:        Patient has prior history of Echocardiogram examinations, most                 recent 11/19/2022. CHF; Risk Factors:Hypertension, Diabetes and                 Dyslipidemia. ESRD on dialysis [facility]), Breast cancer (HCC), OSA                 (obstructive sleep apnea), Covid-19.  Sonographer:    [name] RCS Referring Phys: [id number] Patricia DANIEL ARRIEN IMPRESSIONS  1. Left ventricular ejection fraction, by estimation, is 60 to 65%. The left ventricle has normal function. The left ventricle has no regional wall motion abnormalities. There is mild concentric left ventricular hypertrophy. Left ventricular diastolic parameters are consistent with Grade I diastolic dysfunction (impaired relaxation).  2. Right ventricular systolic function is normal. The right ventricular size is normal. Tricuspid regurgitation signal is inadequate for assessing PA pressure.  3. No evidence of mitral valve regurgitation.  4. The aortic valve is grossly normal. Aortic valve regurgitation is not visualized. Mild aortic valve stenosis.  5. The inferior vena cava is normal in size with greater than 50% respiratory variability, suggesting right atrial pressure of 3 mmHg. Comparison(s): No significant change from prior study. Conclusion(s)/Recommendation(s): No evidence of valvular vegetations on this transthoracic echocardiogram. Consider a transesophageal echocardiogram to exclude infective endocarditis if clinically indicated. FINDINGS  Left Ventricle: Left ventricular ejection fraction, by estimation, is 60 to 65%. The left ventricle has normal function. The left ventricle has no regional wall motion abnormalities. The left ventricular internal cavity size was normal in size. There is  mild concentric left ventricular hypertrophy. Left ventricular diastolic parameters are consistent with Grade I diastolic dysfunction (impaired relaxation). Right Ventricle: The right ventricular size is normal.  Right ventricular systolic function is normal. Tricuspid regurgitation signal is inadequate for assessing PA pressure. Left Atrium: Left atrial size was normal in size. Right Atrium: Right atrial size  Physician Discharge Summary   Patient: Patricia [name] MRN: [id number] DOB: November 19, 1971  Admit date:     09/14/2023  Discharge date: 09/24/23  Discharge Physician: Onalee Hua Rufina Kimery   PCP: [name], PA-C   Recommendations at discharge:   Please follow up with primary care provider within 1-2 weeks  Please repeat BMP and CBC in one week     Hospital Course: As per H&P written by Dr. [name] on 09/14/2023 Patricia [name] is a 52 y.o. female with medical history significant of ESRD on hemodialysis, (M,W,F), obesity class 3, T2DM, hypertension and asthma who presented with dyspnea, fevers and left neck pain.    Patient has a left upper extremity fistula that has been malfunctioning, she underwent Fistulogram on 08.27.24 by Dr [name] and then had a follow up with with vascular surgery in [facility] 09/03/23, plan for further follow up with vascular ultrasound.  In the meanwhile she has been getting HD through a tunneled catheter in her left internal jugular vein.    Patient was noted to have a low grade fever on Wednesday, 09/25 during dialysis. She was treated with acetaminophen and completed HD. During that session, initially her left upper extremity fistula was used but she developed pain and discomfort, not able to complete HD by this route, then completed her treatment through her HD catheter.    The following day she felt a little better, but still not back to baseline, she continue having chills and generalized malaise.  Yesterday she had several episodes of vomiting prior to HD. She was medicated with ondansetron, acetaminophen and lomotil. She was able to complete her HD treatment through her HD catheter.    At home she continue to have chills and now developed pain in her left shoulder area, radiating into her head and left ear. No improving factors.  The patient was found to have MSSA bacteremia.  She was started on cefazolin.  Her tunneled dialysis catheter was removed on 09/16/2023 and a  temporary dialysis catheter was placed after a line holiday.  TEE was negative for vegetations.  Subsequently, her repeat blood cultures remained negative.  IR was reconsulted for placement of a new tunneled Alysis catheter.  The patient began developing some intermittent vomiting.  She stated that she did not have a bowel movement in several days.  CT of the abdomen and pelvis was ordered.  It was neg for obstruction and showed moderate stool/constipation.  She was given a SMOG enema with good results and improvement of her abd pain.  IR placed new tunneled HD catheter on 09/24/23 and pt received HD thereafter.  She will d/c to continue cefazolin 2 grams with each HD, last dose on 10/30/23.  Assessment and Plan:   Severe sepsis secondary to MSSA bacteremia (HCC) -Severe sepsis with elevated leukocytosis, lactic acid, present on admission.  -Continue IV antibiotics--Ancef -Old Dialysis catheter has been removed 9/30 -10/2 TEE--no vegetations -Stable vital signs. -9/30 repeat blood cultures>>neg x 5 days -09/19/23 blood cultures--neg to date -plan to d/c with cefazolin on HD MWF with last dose on 10/30/23 -new HD tunneled catheter placed 10/8 and temporarily HD catheter removed prior to d/c   Acute on chronic hypoxemic and hypercapnic respiratory failure. (Sepsis related).  -Continue supplemental 02  -chronically on 2L at home -currently on 4L>>weaned to 2L -Continue CPAP nightly. -9/30 CXR - vascular congestion -10/29/22 CT chest high res--Regions of cryptogenic organizing pneumonia (COP) suspected as well as possible ILD   ESRD on dialysis [facility]) -Patient chronically  artifact is present on today's exam and could not be eliminated. This reduces exam sensitivity and specificity. MENISCI Medial meniscus: There is moderate intermediate proton density signal intrasubstance degeneration throughout the posterior horn and posterior segment of the body of the medial meniscus. This appears to contact the inferior aspect of the posterior wall of the posterior horn of the medial meniscus (sagittal series 14, image 19), however no tear is seen extending through an articular surface of the medial meniscus. Lateral meniscus: There is intermediate proton density signal intrasubstance degeneration throughout the root of the anterior horn of the lateral meniscus (coronal image 21 and sagittal series 14, image 10). No tear is seen extending through  an articular surface of the lateral meniscus. LIGAMENTS Cruciates: Within the limitations of motion artifact, the ACL and PCL appear intact. Collaterals: There is mild thickening and mild intermediate T2 signal within the proximal anterior aspect of the medial collateral ligament (axial series 8 images 12 and 13, coronal series 11 image 14. Mild-to-moderate edema around the anteromedial aspect of the medial collateral ligament. The fibular collateral ligament, biceps femoris tendon, iliotibial band, and popliteus tendon are intact. CARTILAGE Patellofemoral: Within the limitations of motion artifact, no definite cartilage defect is seen. Medial: Moderate thinning of the posterior non weight-bearing medial femoral condyle cartilage with multiple subchondral cysts (sagittal series 17, image 22 and coronal series 13, image 14). Lateral:  No cartilage defect is seen. Joint: Nojoint effusion. Normal Hoffa's fat pad. No plical thickening. Popliteal Fossa:  No Baker's cyst. Extensor Mechanism:  Intact quadriceps tendon and patellar tendon. Bones:  No acute fracture or dislocation. No definite sinus tract is seen. No cortical erosion is seen. Other: None. IMPRESSION: 1. 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 This appears to contact the inferior aspect of the posterior wall of the posterior horn of the medial meniscus, however no tear is seen extending through an articular surface of the medial meniscus. 2. Intrasubstance degeneration throughout the root of the anterior horn of the lateral meniscus. No tear is seen extending through an articular surface of the lateral meniscus. 3. Mild proximal anteromedial collateral ligament sprain with mild surrounding soft tissue edema. 4. Moderate thinning of the posterior non weight-bearing medial femoral condyle cartilage with multiple subchondral cysts. Of acute 5. No MRI evidence acute  osteomyelitis. Electronically Signed   By: Neita Garnet M.D.   On: 09/20/2023 20:58   DG Knee 1-2 Views Right  Result Date: 09/18/2023 CLINICAL DATA:  Right knee pain.  No known injury. EXAM: RIGHT KNEE - 1-2 VIEW COMPARISON:  None Available. FINDINGS: There is diffuse decreased bone mineralization. No joint effusion. Joint spaces are preserved. No acute fracture or dislocation. High-grade atherosclerotic calcifications. IMPRESSION: 1. No significant osteoarthritis. 2. No acute fracture or dislocation. 3. High-grade atherosclerotic calcifications. Electronically Signed   By: Neita Garnet M.D.   On: 09/18/2023 17:59   ECHO TEE  Result Date: 09/18/2023    TRANSESOPHOGEAL ECHO REPORT   Patient Name:   [facility] Date of Exam: 09/18/2023 Medical Rec #:  [id number]   Height:       63.0 in Accession #:    [id number]  Weight:       247.1 lb Date of Birth:  1971-11-11  BSA:          2.116 m Patient Age:    51 years    BP:           165/81  artifact is present on today's exam and could not be eliminated. This reduces exam sensitivity and specificity. MENISCI Medial meniscus: There is moderate intermediate proton density signal intrasubstance degeneration throughout the posterior horn and posterior segment of the body of the medial meniscus. This appears to contact the inferior aspect of the posterior wall of the posterior horn of the medial meniscus (sagittal series 14, image 19), however no tear is seen extending through an articular surface of the medial meniscus. Lateral meniscus: There is intermediate proton density signal intrasubstance degeneration throughout the root of the anterior horn of the lateral meniscus (coronal image 21 and sagittal series 14, image 10). No tear is seen extending through  an articular surface of the lateral meniscus. LIGAMENTS Cruciates: Within the limitations of motion artifact, the ACL and PCL appear intact. Collaterals: There is mild thickening and mild intermediate T2 signal within the proximal anterior aspect of the medial collateral ligament (axial series 8 images 12 and 13, coronal series 11 image 14. Mild-to-moderate edema around the anteromedial aspect of the medial collateral ligament. The fibular collateral ligament, biceps femoris tendon, iliotibial band, and popliteus tendon are intact. CARTILAGE Patellofemoral: Within the limitations of motion artifact, no definite cartilage defect is seen. Medial: Moderate thinning of the posterior non weight-bearing medial femoral condyle cartilage with multiple subchondral cysts (sagittal series 17, image 22 and coronal series 13, image 14). Lateral:  No cartilage defect is seen. Joint: Nojoint effusion. Normal Hoffa's fat pad. No plical thickening. Popliteal Fossa:  No Baker's cyst. Extensor Mechanism:  Intact quadriceps tendon and patellar tendon. Bones:  No acute fracture or dislocation. No definite sinus tract is seen. No cortical erosion is seen. Other: None. IMPRESSION: 1. 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 This appears to contact the inferior aspect of the posterior wall of the posterior horn of the medial meniscus, however no tear is seen extending through an articular surface of the medial meniscus. 2. Intrasubstance degeneration throughout the root of the anterior horn of the lateral meniscus. No tear is seen extending through an articular surface of the lateral meniscus. 3. Mild proximal anteromedial collateral ligament sprain with mild surrounding soft tissue edema. 4. Moderate thinning of the posterior non weight-bearing medial femoral condyle cartilage with multiple subchondral cysts. Of acute 5. No MRI evidence acute  osteomyelitis. Electronically Signed   By: Neita Garnet M.D.   On: 09/20/2023 20:58   DG Knee 1-2 Views Right  Result Date: 09/18/2023 CLINICAL DATA:  Right knee pain.  No known injury. EXAM: RIGHT KNEE - 1-2 VIEW COMPARISON:  None Available. FINDINGS: There is diffuse decreased bone mineralization. No joint effusion. Joint spaces are preserved. No acute fracture or dislocation. High-grade atherosclerotic calcifications. IMPRESSION: 1. No significant osteoarthritis. 2. No acute fracture or dislocation. 3. High-grade atherosclerotic calcifications. Electronically Signed   By: Neita Garnet M.D.   On: 09/18/2023 17:59   ECHO TEE  Result Date: 09/18/2023    TRANSESOPHOGEAL ECHO REPORT   Patient Name:   [facility] Date of Exam: 09/18/2023 Medical Rec #:  [id number]   Height:       63.0 in Accession #:    [id number]  Weight:       247.1 lb Date of Birth:  1971-11-11  BSA:          2.116 m Patient Age:    51 years    BP:           165/81  artifact is present on today's exam and could not be eliminated. This reduces exam sensitivity and specificity. MENISCI Medial meniscus: There is moderate intermediate proton density signal intrasubstance degeneration throughout the posterior horn and posterior segment of the body of the medial meniscus. This appears to contact the inferior aspect of the posterior wall of the posterior horn of the medial meniscus (sagittal series 14, image 19), however no tear is seen extending through an articular surface of the medial meniscus. Lateral meniscus: There is intermediate proton density signal intrasubstance degeneration throughout the root of the anterior horn of the lateral meniscus (coronal image 21 and sagittal series 14, image 10). No tear is seen extending through  an articular surface of the lateral meniscus. LIGAMENTS Cruciates: Within the limitations of motion artifact, the ACL and PCL appear intact. Collaterals: There is mild thickening and mild intermediate T2 signal within the proximal anterior aspect of the medial collateral ligament (axial series 8 images 12 and 13, coronal series 11 image 14. Mild-to-moderate edema around the anteromedial aspect of the medial collateral ligament. The fibular collateral ligament, biceps femoris tendon, iliotibial band, and popliteus tendon are intact. CARTILAGE Patellofemoral: Within the limitations of motion artifact, no definite cartilage defect is seen. Medial: Moderate thinning of the posterior non weight-bearing medial femoral condyle cartilage with multiple subchondral cysts (sagittal series 17, image 22 and coronal series 13, image 14). Lateral:  No cartilage defect is seen. Joint: Nojoint effusion. Normal Hoffa's fat pad. No plical thickening. Popliteal Fossa:  No Baker's cyst. Extensor Mechanism:  Intact quadriceps tendon and patellar tendon. Bones:  No acute fracture or dislocation. No definite sinus tract is seen. No cortical erosion is seen. Other: None. IMPRESSION: 1. 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 This appears to contact the inferior aspect of the posterior wall of the posterior horn of the medial meniscus, however no tear is seen extending through an articular surface of the medial meniscus. 2. Intrasubstance degeneration throughout the root of the anterior horn of the lateral meniscus. No tear is seen extending through an articular surface of the lateral meniscus. 3. Mild proximal anteromedial collateral ligament sprain with mild surrounding soft tissue edema. 4. Moderate thinning of the posterior non weight-bearing medial femoral condyle cartilage with multiple subchondral cysts. Of acute 5. No MRI evidence acute  osteomyelitis. Electronically Signed   By: Neita Garnet M.D.   On: 09/20/2023 20:58   DG Knee 1-2 Views Right  Result Date: 09/18/2023 CLINICAL DATA:  Right knee pain.  No known injury. EXAM: RIGHT KNEE - 1-2 VIEW COMPARISON:  None Available. FINDINGS: There is diffuse decreased bone mineralization. No joint effusion. Joint spaces are preserved. No acute fracture or dislocation. High-grade atherosclerotic calcifications. IMPRESSION: 1. No significant osteoarthritis. 2. No acute fracture or dislocation. 3. High-grade atherosclerotic calcifications. Electronically Signed   By: Neita Garnet M.D.   On: 09/18/2023 17:59   ECHO TEE  Result Date: 09/18/2023    TRANSESOPHOGEAL ECHO REPORT   Patient Name:   [facility] Date of Exam: 09/18/2023 Medical Rec #:  [id number]   Height:       63.0 in Accession #:    [id number]  Weight:       247.1 lb Date of Birth:  1971-11-11  BSA:          2.116 m Patient Age:    51 years    BP:           165/81  artifact is present on today's exam and could not be eliminated. This reduces exam sensitivity and specificity. MENISCI Medial meniscus: There is moderate intermediate proton density signal intrasubstance degeneration throughout the posterior horn and posterior segment of the body of the medial meniscus. This appears to contact the inferior aspect of the posterior wall of the posterior horn of the medial meniscus (sagittal series 14, image 19), however no tear is seen extending through an articular surface of the medial meniscus. Lateral meniscus: There is intermediate proton density signal intrasubstance degeneration throughout the root of the anterior horn of the lateral meniscus (coronal image 21 and sagittal series 14, image 10). No tear is seen extending through  an articular surface of the lateral meniscus. LIGAMENTS Cruciates: Within the limitations of motion artifact, the ACL and PCL appear intact. Collaterals: There is mild thickening and mild intermediate T2 signal within the proximal anterior aspect of the medial collateral ligament (axial series 8 images 12 and 13, coronal series 11 image 14. Mild-to-moderate edema around the anteromedial aspect of the medial collateral ligament. The fibular collateral ligament, biceps femoris tendon, iliotibial band, and popliteus tendon are intact. CARTILAGE Patellofemoral: Within the limitations of motion artifact, no definite cartilage defect is seen. Medial: Moderate thinning of the posterior non weight-bearing medial femoral condyle cartilage with multiple subchondral cysts (sagittal series 17, image 22 and coronal series 13, image 14). Lateral:  No cartilage defect is seen. Joint: Nojoint effusion. Normal Hoffa's fat pad. No plical thickening. Popliteal Fossa:  No Baker's cyst. Extensor Mechanism:  Intact quadriceps tendon and patellar tendon. Bones:  No acute fracture or dislocation. No definite sinus tract is seen. No cortical erosion is seen. Other: None. IMPRESSION: 1. 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 This appears to contact the inferior aspect of the posterior wall of the posterior horn of the medial meniscus, however no tear is seen extending through an articular surface of the medial meniscus. 2. Intrasubstance degeneration throughout the root of the anterior horn of the lateral meniscus. No tear is seen extending through an articular surface of the lateral meniscus. 3. Mild proximal anteromedial collateral ligament sprain with mild surrounding soft tissue edema. 4. Moderate thinning of the posterior non weight-bearing medial femoral condyle cartilage with multiple subchondral cysts. Of acute 5. No MRI evidence acute  osteomyelitis. Electronically Signed   By: Neita Garnet M.D.   On: 09/20/2023 20:58   DG Knee 1-2 Views Right  Result Date: 09/18/2023 CLINICAL DATA:  Right knee pain.  No known injury. EXAM: RIGHT KNEE - 1-2 VIEW COMPARISON:  None Available. FINDINGS: There is diffuse decreased bone mineralization. No joint effusion. Joint spaces are preserved. No acute fracture or dislocation. High-grade atherosclerotic calcifications. IMPRESSION: 1. No significant osteoarthritis. 2. No acute fracture or dislocation. 3. High-grade atherosclerotic calcifications. Electronically Signed   By: Neita Garnet M.D.   On: 09/18/2023 17:59   ECHO TEE  Result Date: 09/18/2023    TRANSESOPHOGEAL ECHO REPORT   Patient Name:   [facility] Date of Exam: 09/18/2023 Medical Rec #:  [id number]   Height:       63.0 in Accession #:    [id number]  Weight:       247.1 lb Date of Birth:  1971-11-11  BSA:          2.116 m Patient Age:    51 years    BP:           165/81  Physician Discharge Summary   Patient: Patricia [name] MRN: [id number] DOB: November 19, 1971  Admit date:     09/14/2023  Discharge date: 09/24/23  Discharge Physician: Onalee Hua Rufina Kimery   PCP: [name], PA-C   Recommendations at discharge:   Please follow up with primary care provider within 1-2 weeks  Please repeat BMP and CBC in one week     Hospital Course: As per H&P written by Dr. [name] on 09/14/2023 Patricia [name] is a 52 y.o. female with medical history significant of ESRD on hemodialysis, (M,W,F), obesity class 3, T2DM, hypertension and asthma who presented with dyspnea, fevers and left neck pain.    Patient has a left upper extremity fistula that has been malfunctioning, she underwent Fistulogram on 08.27.24 by Dr [name] and then had a follow up with with vascular surgery in [facility] 09/03/23, plan for further follow up with vascular ultrasound.  In the meanwhile she has been getting HD through a tunneled catheter in her left internal jugular vein.    Patient was noted to have a low grade fever on Wednesday, 09/25 during dialysis. She was treated with acetaminophen and completed HD. During that session, initially her left upper extremity fistula was used but she developed pain and discomfort, not able to complete HD by this route, then completed her treatment through her HD catheter.    The following day she felt a little better, but still not back to baseline, she continue having chills and generalized malaise.  Yesterday she had several episodes of vomiting prior to HD. She was medicated with ondansetron, acetaminophen and lomotil. She was able to complete her HD treatment through her HD catheter.    At home she continue to have chills and now developed pain in her left shoulder area, radiating into her head and left ear. No improving factors.  The patient was found to have MSSA bacteremia.  She was started on cefazolin.  Her tunneled dialysis catheter was removed on 09/16/2023 and a  temporary dialysis catheter was placed after a line holiday.  TEE was negative for vegetations.  Subsequently, her repeat blood cultures remained negative.  IR was reconsulted for placement of a new tunneled Alysis catheter.  The patient began developing some intermittent vomiting.  She stated that she did not have a bowel movement in several days.  CT of the abdomen and pelvis was ordered.  It was neg for obstruction and showed moderate stool/constipation.  She was given a SMOG enema with good results and improvement of her abd pain.  IR placed new tunneled HD catheter on 09/24/23 and pt received HD thereafter.  She will d/c to continue cefazolin 2 grams with each HD, last dose on 10/30/23.  Assessment and Plan:   Severe sepsis secondary to MSSA bacteremia (HCC) -Severe sepsis with elevated leukocytosis, lactic acid, present on admission.  -Continue IV antibiotics--Ancef -Old Dialysis catheter has been removed 9/30 -10/2 TEE--no vegetations -Stable vital signs. -9/30 repeat blood cultures>>neg x 5 days -09/19/23 blood cultures--neg to date -plan to d/c with cefazolin on HD MWF with last dose on 10/30/23 -new HD tunneled catheter placed 10/8 and temporarily HD catheter removed prior to d/c   Acute on chronic hypoxemic and hypercapnic respiratory failure. (Sepsis related).  -Continue supplemental 02  -chronically on 2L at home -currently on 4L>>weaned to 2L -Continue CPAP nightly. -9/30 CXR - vascular congestion -10/29/22 CT chest high res--Regions of cryptogenic organizing pneumonia (COP) suspected as well as possible ILD   ESRD on dialysis [facility]) -Patient chronically  Doppler performed. Nona Dell MD Electronically signed by Nona Dell MD Signature Date/Time: 09/18/2023/12:57:59 PM    Final    DG Chest Port 1 View  Result Date: 09/16/2023 CLINICAL DATA:  Encounter removal of vascular catheter. EXAM: PORTABLE CHEST 1 VIEW COMPARISON:  Radiograph 06/14/2023 FINDINGS: The previous left-sided dialysis catheter is been removed. No evidence of pneumothorax. Soft tissue attenuation from habitus limits detailed assessment. Cardiomegaly is stable. Suspected vascular congestion. No large pleural effusion. IMPRESSION: 1. The left-sided dialysis catheter has been removed. No evidence of pneumothorax. 2. Cardiomegaly with vascular congestion. Electronically Signed   By: Patricia [name] M.D.   On: 09/16/2023 19:14   ECHOCARDIOGRAM COMPLETE  Result Date: 09/15/2023    ECHOCARDIOGRAM REPORT   Patient Name:   [facility] Date of Exam: 09/15/2023 Medical Rec #:  [id number]   Height:       63.0 in Accession #:    [id number]  Weight:       245.0 lb Date of Birth:  [birth year]  BSA:          2.108 m Patient Age:    51 years    BP:           125132/4757 mmHg Patient Gender: F           HR:           93 bpm. [facility] Procedure: 2D Echo, Cardiac Doppler and Color Doppler  Indications:    Fever R50.9  History:        Patient has prior history of Echocardiogram examinations, most                 recent 11/19/2022. CHF; Risk Factors:Hypertension, Diabetes and                 Dyslipidemia. ESRD on dialysis [facility]), Breast cancer (HCC), OSA                 (obstructive sleep apnea), Covid-19.  Sonographer:    [name] RCS Referring Phys: [id number] Patricia DANIEL ARRIEN IMPRESSIONS  1. Left ventricular ejection fraction, by estimation, is 60 to 65%. The left ventricle has normal function. The left ventricle has no regional wall motion abnormalities. There is mild concentric left ventricular hypertrophy. Left ventricular diastolic parameters are consistent with Grade I diastolic dysfunction (impaired relaxation).  2. Right ventricular systolic function is normal. The right ventricular size is normal. Tricuspid regurgitation signal is inadequate for assessing PA pressure.  3. No evidence of mitral valve regurgitation.  4. The aortic valve is grossly normal. Aortic valve regurgitation is not visualized. Mild aortic valve stenosis.  5. The inferior vena cava is normal in size with greater than 50% respiratory variability, suggesting right atrial pressure of 3 mmHg. Comparison(s): No significant change from prior study. Conclusion(s)/Recommendation(s): No evidence of valvular vegetations on this transthoracic echocardiogram. Consider a transesophageal echocardiogram to exclude infective endocarditis if clinically indicated. FINDINGS  Left Ventricle: Left ventricular ejection fraction, by estimation, is 60 to 65%. The left ventricle has normal function. The left ventricle has no regional wall motion abnormalities. The left ventricular internal cavity size was normal in size. There is  mild concentric left ventricular hypertrophy. Left ventricular diastolic parameters are consistent with Grade I diastolic dysfunction (impaired relaxation). Right Ventricle: The right ventricular size is normal.  Right ventricular systolic function is normal. Tricuspid regurgitation signal is inadequate for assessing PA pressure. Left Atrium: Left atrial size was normal in size. Right Atrium: Right atrial size  artifact is present on today's exam and could not be eliminated. This reduces exam sensitivity and specificity. MENISCI Medial meniscus: There is moderate intermediate proton density signal intrasubstance degeneration throughout the posterior horn and posterior segment of the body of the medial meniscus. This appears to contact the inferior aspect of the posterior wall of the posterior horn of the medial meniscus (sagittal series 14, image 19), however no tear is seen extending through an articular surface of the medial meniscus. Lateral meniscus: There is intermediate proton density signal intrasubstance degeneration throughout the root of the anterior horn of the lateral meniscus (coronal image 21 and sagittal series 14, image 10). No tear is seen extending through  an articular surface of the lateral meniscus. LIGAMENTS Cruciates: Within the limitations of motion artifact, the ACL and PCL appear intact. Collaterals: There is mild thickening and mild intermediate T2 signal within the proximal anterior aspect of the medial collateral ligament (axial series 8 images 12 and 13, coronal series 11 image 14. Mild-to-moderate edema around the anteromedial aspect of the medial collateral ligament. The fibular collateral ligament, biceps femoris tendon, iliotibial band, and popliteus tendon are intact. CARTILAGE Patellofemoral: Within the limitations of motion artifact, no definite cartilage defect is seen. Medial: Moderate thinning of the posterior non weight-bearing medial femoral condyle cartilage with multiple subchondral cysts (sagittal series 17, image 22 and coronal series 13, image 14). Lateral:  No cartilage defect is seen. Joint: Nojoint effusion. Normal Hoffa's fat pad. No plical thickening. Popliteal Fossa:  No Baker's cyst. Extensor Mechanism:  Intact quadriceps tendon and patellar tendon. Bones:  No acute fracture or dislocation. No definite sinus tract is seen. No cortical erosion is seen. Other: None. IMPRESSION: 1. 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 This appears to contact the inferior aspect of the posterior wall of the posterior horn of the medial meniscus, however no tear is seen extending through an articular surface of the medial meniscus. 2. Intrasubstance degeneration throughout the root of the anterior horn of the lateral meniscus. No tear is seen extending through an articular surface of the lateral meniscus. 3. Mild proximal anteromedial collateral ligament sprain with mild surrounding soft tissue edema. 4. Moderate thinning of the posterior non weight-bearing medial femoral condyle cartilage with multiple subchondral cysts. Of acute 5. No MRI evidence acute  osteomyelitis. Electronically Signed   By: Neita Garnet M.D.   On: 09/20/2023 20:58   DG Knee 1-2 Views Right  Result Date: 09/18/2023 CLINICAL DATA:  Right knee pain.  No known injury. EXAM: RIGHT KNEE - 1-2 VIEW COMPARISON:  None Available. FINDINGS: There is diffuse decreased bone mineralization. No joint effusion. Joint spaces are preserved. No acute fracture or dislocation. High-grade atherosclerotic calcifications. IMPRESSION: 1. No significant osteoarthritis. 2. No acute fracture or dislocation. 3. High-grade atherosclerotic calcifications. Electronically Signed   By: Neita Garnet M.D.   On: 09/18/2023 17:59   ECHO TEE  Result Date: 09/18/2023    TRANSESOPHOGEAL ECHO REPORT   Patient Name:   [facility] Date of Exam: 09/18/2023 Medical Rec #:  [id number]   Height:       63.0 in Accession #:    [id number]  Weight:       247.1 lb Date of Birth:  1971-11-11  BSA:          2.116 m Patient Age:    51 years    BP:           165/81  Physician Discharge Summary   Patient: Patricia [name] MRN: [id number] DOB: November 19, 1971  Admit date:     09/14/2023  Discharge date: 09/24/23  Discharge Physician: Onalee Hua Rufina Kimery   PCP: [name], PA-C   Recommendations at discharge:   Please follow up with primary care provider within 1-2 weeks  Please repeat BMP and CBC in one week     Hospital Course: As per H&P written by Dr. [name] on 09/14/2023 Patricia [name] is a 52 y.o. female with medical history significant of ESRD on hemodialysis, (M,W,F), obesity class 3, T2DM, hypertension and asthma who presented with dyspnea, fevers and left neck pain.    Patient has a left upper extremity fistula that has been malfunctioning, she underwent Fistulogram on 08.27.24 by Dr [name] and then had a follow up with with vascular surgery in [facility] 09/03/23, plan for further follow up with vascular ultrasound.  In the meanwhile she has been getting HD through a tunneled catheter in her left internal jugular vein.    Patient was noted to have a low grade fever on Wednesday, 09/25 during dialysis. She was treated with acetaminophen and completed HD. During that session, initially her left upper extremity fistula was used but she developed pain and discomfort, not able to complete HD by this route, then completed her treatment through her HD catheter.    The following day she felt a little better, but still not back to baseline, she continue having chills and generalized malaise.  Yesterday she had several episodes of vomiting prior to HD. She was medicated with ondansetron, acetaminophen and lomotil. She was able to complete her HD treatment through her HD catheter.    At home she continue to have chills and now developed pain in her left shoulder area, radiating into her head and left ear. No improving factors.  The patient was found to have MSSA bacteremia.  She was started on cefazolin.  Her tunneled dialysis catheter was removed on 09/16/2023 and a  temporary dialysis catheter was placed after a line holiday.  TEE was negative for vegetations.  Subsequently, her repeat blood cultures remained negative.  IR was reconsulted for placement of a new tunneled Alysis catheter.  The patient began developing some intermittent vomiting.  She stated that she did not have a bowel movement in several days.  CT of the abdomen and pelvis was ordered.  It was neg for obstruction and showed moderate stool/constipation.  She was given a SMOG enema with good results and improvement of her abd pain.  IR placed new tunneled HD catheter on 09/24/23 and pt received HD thereafter.  She will d/c to continue cefazolin 2 grams with each HD, last dose on 10/30/23.  Assessment and Plan:   Severe sepsis secondary to MSSA bacteremia (HCC) -Severe sepsis with elevated leukocytosis, lactic acid, present on admission.  -Continue IV antibiotics--Ancef -Old Dialysis catheter has been removed 9/30 -10/2 TEE--no vegetations -Stable vital signs. -9/30 repeat blood cultures>>neg x 5 days -09/19/23 blood cultures--neg to date -plan to d/c with cefazolin on HD MWF with last dose on 10/30/23 -new HD tunneled catheter placed 10/8 and temporarily HD catheter removed prior to d/c   Acute on chronic hypoxemic and hypercapnic respiratory failure. (Sepsis related).  -Continue supplemental 02  -chronically on 2L at home -currently on 4L>>weaned to 2L -Continue CPAP nightly. -9/30 CXR - vascular congestion -10/29/22 CT chest high res--Regions of cryptogenic organizing pneumonia (COP) suspected as well as possible ILD   ESRD on dialysis [facility]) -Patient chronically  Doppler performed. Nona Dell MD Electronically signed by Nona Dell MD Signature Date/Time: 09/18/2023/12:57:59 PM    Final    DG Chest Port 1 View  Result Date: 09/16/2023 CLINICAL DATA:  Encounter removal of vascular catheter. EXAM: PORTABLE CHEST 1 VIEW COMPARISON:  Radiograph 06/14/2023 FINDINGS: The previous left-sided dialysis catheter is been removed. No evidence of pneumothorax. Soft tissue attenuation from habitus limits detailed assessment. Cardiomegaly is stable. Suspected vascular congestion. No large pleural effusion. IMPRESSION: 1. The left-sided dialysis catheter has been removed. No evidence of pneumothorax. 2. Cardiomegaly with vascular congestion. Electronically Signed   By: Patricia [name] M.D.   On: 09/16/2023 19:14   ECHOCARDIOGRAM COMPLETE  Result Date: 09/15/2023    ECHOCARDIOGRAM REPORT   Patient Name:   [facility] Date of Exam: 09/15/2023 Medical Rec #:  [id number]   Height:       63.0 in Accession #:    [id number]  Weight:       245.0 lb Date of Birth:  [birth year]  BSA:          2.108 m Patient Age:    51 years    BP:           125132/4757 mmHg Patient Gender: F           HR:           93 bpm. [facility] Procedure: 2D Echo, Cardiac Doppler and Color Doppler  Indications:    Fever R50.9  History:        Patient has prior history of Echocardiogram examinations, most                 recent 11/19/2022. CHF; Risk Factors:Hypertension, Diabetes and                 Dyslipidemia. ESRD on dialysis [facility]), Breast cancer (HCC), OSA                 (obstructive sleep apnea), Covid-19.  Sonographer:    [name] RCS Referring Phys: [id number] Patricia DANIEL ARRIEN IMPRESSIONS  1. Left ventricular ejection fraction, by estimation, is 60 to 65%. The left ventricle has normal function. The left ventricle has no regional wall motion abnormalities. There is mild concentric left ventricular hypertrophy. Left ventricular diastolic parameters are consistent with Grade I diastolic dysfunction (impaired relaxation).  2. Right ventricular systolic function is normal. The right ventricular size is normal. Tricuspid regurgitation signal is inadequate for assessing PA pressure.  3. No evidence of mitral valve regurgitation.  4. The aortic valve is grossly normal. Aortic valve regurgitation is not visualized. Mild aortic valve stenosis.  5. The inferior vena cava is normal in size with greater than 50% respiratory variability, suggesting right atrial pressure of 3 mmHg. Comparison(s): No significant change from prior study. Conclusion(s)/Recommendation(s): No evidence of valvular vegetations on this transthoracic echocardiogram. Consider a transesophageal echocardiogram to exclude infective endocarditis if clinically indicated. FINDINGS  Left Ventricle: Left ventricular ejection fraction, by estimation, is 60 to 65%. The left ventricle has normal function. The left ventricle has no regional wall motion abnormalities. The left ventricular internal cavity size was normal in size. There is  mild concentric left ventricular hypertrophy. Left ventricular diastolic parameters are consistent with Grade I diastolic dysfunction (impaired relaxation). Right Ventricle: The right ventricular size is normal.  Right ventricular systolic function is normal. Tricuspid regurgitation signal is inadequate for assessing PA pressure. Left Atrium: Left atrial size was normal in size. Right Atrium: Right atrial size

## 2023-09-24 NOTE — TOC Transition Note (Signed)
Transition of Care [facility]) - CM/SW Discharge Note   Patient Details  Name: Patricia [name] MRN: [id number] Date of Birth: January 12, 1971  Transition of Care [facility]) CM/SW Contact:  Elliot Gault, LCSW Phone Number: 09/24/2023, 10:42 AM   Clinical Narrative:     Per MD, pt will dc home today after HD and will need anbx with HD until 10/30/23.   Updated RN at [facility] and faxed ID MD note with plan of care.   No other TOC needs for dc.  Final next level of care: Home/Self Care Barriers to Discharge: Barriers Resolved   Patient Goals and CMS Choice   Choice offered to / list presented to : Patient  Discharge Placement                         Discharge Plan and Services Additional resources added to the After Visit Summary for   In-house Referral: Clinical Social Work                                   Social Determinants of Health (SDOH) Interventions SDOH Screenings   Food Insecurity: No Food Insecurity (09/14/2023)  Housing: Low Risk  (09/14/2023)  Transportation Needs: No Transportation Needs (09/14/2023)  Utilities: Not At Risk (09/14/2023)  Financial Resource Strain: Low Risk  (06/13/2023)  Physical Activity: Unknown (06/16/2019)   Received from [facility], [facility]  Social Connections: Unknown (06/16/2019)   Received from [facility], [facility]  Tobacco Use: Low Risk  (09/18/2023)     Readmission Risk Interventions    09/17/2023    8:16 AM 05/27/2023    4:48 PM 12/11/2022    1:40 PM  Readmission Risk Prevention Plan  Transportation Screening Complete Complete Complete  Medication Review (RN Care Manager) Complete Referral to Pharmacy Complete  PCP or Specialist appointment within 3-5 days of discharge  Complete   HRI or Home Care Consult Complete Complete Complete  SW Recovery Care/Counseling Consult Complete Complete Complete  Palliative Care Screening Not Applicable  Not Applicable Not Applicable  Skilled Nursing Facility Not Applicable Not Applicable Not Applicable

## 2023-09-24 NOTE — Progress Notes (Signed)
   HEMODIALYSIS TREATMENT NOTE:  Uneventful 4 hour low-heparin treatment completed using left EJ TDC. Goal met: 1.8 liters removed without interruption in UF.  Ancef 2g given during last 21m of tx. All blood was returned.     09/24/23 1740  Vitals  Temp 98.1 F (36.7 C)  Temp Source Oral  BP 132/63  MAP (mmHg) 80  BP Location Right Arm  BP Method Automatic  Patient Position (if appropriate) Standing  Pulse Rate 80  Pulse Rate Source Monitor  ECG Heart Rate 81  Resp 18  Oxygen Therapy  SpO2 100 %  O2 Device Nasal Cannula  O2 Flow Rate (L/min) 2 L/min  Post Treatment  Dialyzer Clearance Lightly streaked  Hemodialysis Intake (mL) 0 mL  Liters Processed 95.3  Fluid Removed (mL) 1800 mL  Tolerated HD Treatment Yes  Post-Hemodialysis Comments Goal met  Fistula / Graft Left Upper arm Arteriovenous fistula  No placement date or time found.   Placed prior to admission: Yes  Orientation: Left  Access Location: Upper arm  Access Type: Arteriovenous fistula  Fistula / Graft Assessment Thrill (pulsatile)  Hemodialysis Catheter Right Other (Comment) Double lumen Permanent (Tunneled)  Placement Date/Time: 09/24/23 1005   Serial / Lot #: [id number]  Expiration Date: 03/16/28  Time Out: Correct patient;Correct site;Correct procedure  Maximum sterile barrier precautions: Hand hygiene;Cap;Mask;Sterile gown;Sterile gloves;Large sterile s...  Site Condition No complications  Blue Lumen Status Flushed;Heparin locked;Dead end cap in place  Red Lumen Status Flushed;Heparin locked;Dead end cap in place  Purple Lumen Status N/A  Catheter fill solution Heparin 1000 units/ml  Catheter fill volume (Arterial) 1.6 cc  Catheter fill volume (Venous) 1.6  Dressing Type Transparent;Tube stabilization device  Dressing Status Antimicrobial disc in place;Clean, Dry, Intact  Interventions New dressing  Drainage Description None  Dressing Change Due 10/01/23  Post treatment catheter status Capped and Clamped     Arman Filter, RN AP K[facility]

## 2023-09-24 NOTE — Consult Note (Signed)
intact. CARTILAGE Patellofemoral: Within the limitations of motion artifact, no definite cartilage defect is seen. Medial: Moderate thinning of the posterior non weight-bearing medial femoral condyle cartilage with multiple subchondral cysts (sagittal series 17, image 22 and coronal series 13, image 14). Lateral:  No cartilage defect is seen. Joint: Nojoint effusion. Normal Hoffa's fat pad. No plical thickening. Popliteal Fossa:  No Baker's cyst. Extensor Mechanism:  Intact quadriceps tendon and patellar tendon. Bones:  No acute fracture or dislocation. No definite sinus tract is seen. No cortical erosion is seen. Other: None. IMPRESSION: 1. 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 This appears to contact the inferior aspect of the posterior wall of the posterior horn of the medial meniscus, however no tear is seen extending through an articular surface of the medial meniscus. 2. Intrasubstance degeneration throughout the root of the anterior horn of the lateral meniscus. No tear is seen extending through an articular surface of the lateral meniscus. 3. Mild proximal anteromedial collateral ligament sprain with mild surrounding soft tissue edema. 4. Moderate thinning of the posterior non weight-bearing medial femoral condyle cartilage with multiple subchondral cysts. Of acute 5. No MRI evidence acute osteomyelitis. Electronically Signed   By: Neita Garnet M.D.   On: 09/20/2023 20:58   DG Knee 1-2 Views Right  Result Date: 09/18/2023 CLINICAL DATA:  Right knee pain.  No known injury. EXAM: RIGHT KNEE - 1-2 VIEW COMPARISON:  None Available. FINDINGS: There is diffuse decreased bone mineralization. No joint effusion. Joint spaces are preserved. No acute fracture or dislocation. High-grade atherosclerotic calcifications. IMPRESSION: 1. No significant osteoarthritis. 2. No acute fracture or dislocation. 3. High-grade  atherosclerotic calcifications. Electronically Signed   By: Neita Garnet M.D.   On: 09/18/2023 17:59   ECHO TEE  Result Date: 09/18/2023    TRANSESOPHOGEAL ECHO REPORT   Patient Name:   [facility] Date of Exam: 09/18/2023 Medical Rec #:  [id number]   Height:       63.0 in Accession #:    [id number]  Weight:       247.1 lb Date of Birth:  1970/12/30  BSA:          2.116 m Patient Age:    52 years    BP:           165/81 mmHg Patient Gender: F           HR:           91 bpm. [facility] Procedure: Transesophageal Echo, Cardiac Doppler and Color Doppler Indications:    Bacteremia R78.81  History:        Patient has prior history of Echocardiogram examinations, most                 recent 09/15/2023. CHF; Risk Factors:Hypertension, Diabetes and                 Dyslipidemia. ESRD on dialysis [facility]), Breast cancer (HCC), OSA                 (obstructive sleep apnea), Covid-19.  Sonographer:    [name] RCS Referring Phys: [id number] [name] PROCEDURE: After discussion of the risks and benefits of a TEE, an informed consent was obtained from the patient. TEE procedure time was 7 minutes. The transesophogeal probe was passed without difficulty through the esophogus of the patient. Imaged were  obtained with the patient in a left lateral decubitus  intact. CARTILAGE Patellofemoral: Within the limitations of motion artifact, no definite cartilage defect is seen. Medial: Moderate thinning of the posterior non weight-bearing medial femoral condyle cartilage with multiple subchondral cysts (sagittal series 17, image 22 and coronal series 13, image 14). Lateral:  No cartilage defect is seen. Joint: Nojoint effusion. Normal Hoffa's fat pad. No plical thickening. Popliteal Fossa:  No Baker's cyst. Extensor Mechanism:  Intact quadriceps tendon and patellar tendon. Bones:  No acute fracture or dislocation. No definite sinus tract is seen. No cortical erosion is seen. Other: None. IMPRESSION: 1. 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 This appears to contact the inferior aspect of the posterior wall of the posterior horn of the medial meniscus, however no tear is seen extending through an articular surface of the medial meniscus. 2. Intrasubstance degeneration throughout the root of the anterior horn of the lateral meniscus. No tear is seen extending through an articular surface of the lateral meniscus. 3. Mild proximal anteromedial collateral ligament sprain with mild surrounding soft tissue edema. 4. Moderate thinning of the posterior non weight-bearing medial femoral condyle cartilage with multiple subchondral cysts. Of acute 5. No MRI evidence acute osteomyelitis. Electronically Signed   By: Neita Garnet M.D.   On: 09/20/2023 20:58   DG Knee 1-2 Views Right  Result Date: 09/18/2023 CLINICAL DATA:  Right knee pain.  No known injury. EXAM: RIGHT KNEE - 1-2 VIEW COMPARISON:  None Available. FINDINGS: There is diffuse decreased bone mineralization. No joint effusion. Joint spaces are preserved. No acute fracture or dislocation. High-grade atherosclerotic calcifications. IMPRESSION: 1. No significant osteoarthritis. 2. No acute fracture or dislocation. 3. High-grade  atherosclerotic calcifications. Electronically Signed   By: Neita Garnet M.D.   On: 09/18/2023 17:59   ECHO TEE  Result Date: 09/18/2023    TRANSESOPHOGEAL ECHO REPORT   Patient Name:   [facility] Date of Exam: 09/18/2023 Medical Rec #:  [id number]   Height:       63.0 in Accession #:    [id number]  Weight:       247.1 lb Date of Birth:  1970/12/30  BSA:          2.116 m Patient Age:    52 years    BP:           165/81 mmHg Patient Gender: F           HR:           91 bpm. [facility] Procedure: Transesophageal Echo, Cardiac Doppler and Color Doppler Indications:    Bacteremia R78.81  History:        Patient has prior history of Echocardiogram examinations, most                 recent 09/15/2023. CHF; Risk Factors:Hypertension, Diabetes and                 Dyslipidemia. ESRD on dialysis [facility]), Breast cancer (HCC), OSA                 (obstructive sleep apnea), Covid-19.  Sonographer:    [name] RCS Referring Phys: [id number] [name] PROCEDURE: After discussion of the risks and benefits of a TEE, an informed consent was obtained from the patient. TEE procedure time was 7 minutes. The transesophogeal probe was passed without difficulty through the esophogus of the patient. Imaged were  obtained with the patient in a left lateral decubitus  intact. CARTILAGE Patellofemoral: Within the limitations of motion artifact, no definite cartilage defect is seen. Medial: Moderate thinning of the posterior non weight-bearing medial femoral condyle cartilage with multiple subchondral cysts (sagittal series 17, image 22 and coronal series 13, image 14). Lateral:  No cartilage defect is seen. Joint: Nojoint effusion. Normal Hoffa's fat pad. No plical thickening. Popliteal Fossa:  No Baker's cyst. Extensor Mechanism:  Intact quadriceps tendon and patellar tendon. Bones:  No acute fracture or dislocation. No definite sinus tract is seen. No cortical erosion is seen. Other: None. IMPRESSION: 1. 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 This appears to contact the inferior aspect of the posterior wall of the posterior horn of the medial meniscus, however no tear is seen extending through an articular surface of the medial meniscus. 2. Intrasubstance degeneration throughout the root of the anterior horn of the lateral meniscus. No tear is seen extending through an articular surface of the lateral meniscus. 3. Mild proximal anteromedial collateral ligament sprain with mild surrounding soft tissue edema. 4. Moderate thinning of the posterior non weight-bearing medial femoral condyle cartilage with multiple subchondral cysts. Of acute 5. No MRI evidence acute osteomyelitis. Electronically Signed   By: Neita Garnet M.D.   On: 09/20/2023 20:58   DG Knee 1-2 Views Right  Result Date: 09/18/2023 CLINICAL DATA:  Right knee pain.  No known injury. EXAM: RIGHT KNEE - 1-2 VIEW COMPARISON:  None Available. FINDINGS: There is diffuse decreased bone mineralization. No joint effusion. Joint spaces are preserved. No acute fracture or dislocation. High-grade atherosclerotic calcifications. IMPRESSION: 1. No significant osteoarthritis. 2. No acute fracture or dislocation. 3. High-grade  atherosclerotic calcifications. Electronically Signed   By: Neita Garnet M.D.   On: 09/18/2023 17:59   ECHO TEE  Result Date: 09/18/2023    TRANSESOPHOGEAL ECHO REPORT   Patient Name:   [facility] Date of Exam: 09/18/2023 Medical Rec #:  [id number]   Height:       63.0 in Accession #:    [id number]  Weight:       247.1 lb Date of Birth:  1970/12/30  BSA:          2.116 m Patient Age:    52 years    BP:           165/81 mmHg Patient Gender: F           HR:           91 bpm. [facility] Procedure: Transesophageal Echo, Cardiac Doppler and Color Doppler Indications:    Bacteremia R78.81  History:        Patient has prior history of Echocardiogram examinations, most                 recent 09/15/2023. CHF; Risk Factors:Hypertension, Diabetes and                 Dyslipidemia. ESRD on dialysis [facility]), Breast cancer (HCC), OSA                 (obstructive sleep apnea), Covid-19.  Sonographer:    [name] RCS Referring Phys: [id number] [name] PROCEDURE: After discussion of the risks and benefits of a TEE, an informed consent was obtained from the patient. TEE procedure time was 7 minutes. The transesophogeal probe was passed without difficulty through the esophogus of the patient. Imaged were  obtained with the patient in a left lateral decubitus  intact. CARTILAGE Patellofemoral: Within the limitations of motion artifact, no definite cartilage defect is seen. Medial: Moderate thinning of the posterior non weight-bearing medial femoral condyle cartilage with multiple subchondral cysts (sagittal series 17, image 22 and coronal series 13, image 14). Lateral:  No cartilage defect is seen. Joint: Nojoint effusion. Normal Hoffa's fat pad. No plical thickening. Popliteal Fossa:  No Baker's cyst. Extensor Mechanism:  Intact quadriceps tendon and patellar tendon. Bones:  No acute fracture or dislocation. No definite sinus tract is seen. No cortical erosion is seen. Other: None. IMPRESSION: 1. 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 This appears to contact the inferior aspect of the posterior wall of the posterior horn of the medial meniscus, however no tear is seen extending through an articular surface of the medial meniscus. 2. Intrasubstance degeneration throughout the root of the anterior horn of the lateral meniscus. No tear is seen extending through an articular surface of the lateral meniscus. 3. Mild proximal anteromedial collateral ligament sprain with mild surrounding soft tissue edema. 4. Moderate thinning of the posterior non weight-bearing medial femoral condyle cartilage with multiple subchondral cysts. Of acute 5. No MRI evidence acute osteomyelitis. Electronically Signed   By: Neita Garnet M.D.   On: 09/20/2023 20:58   DG Knee 1-2 Views Right  Result Date: 09/18/2023 CLINICAL DATA:  Right knee pain.  No known injury. EXAM: RIGHT KNEE - 1-2 VIEW COMPARISON:  None Available. FINDINGS: There is diffuse decreased bone mineralization. No joint effusion. Joint spaces are preserved. No acute fracture or dislocation. High-grade atherosclerotic calcifications. IMPRESSION: 1. No significant osteoarthritis. 2. No acute fracture or dislocation. 3. High-grade  atherosclerotic calcifications. Electronically Signed   By: Neita Garnet M.D.   On: 09/18/2023 17:59   ECHO TEE  Result Date: 09/18/2023    TRANSESOPHOGEAL ECHO REPORT   Patient Name:   [facility] Date of Exam: 09/18/2023 Medical Rec #:  [id number]   Height:       63.0 in Accession #:    [id number]  Weight:       247.1 lb Date of Birth:  1970/12/30  BSA:          2.116 m Patient Age:    52 years    BP:           165/81 mmHg Patient Gender: F           HR:           91 bpm. [facility] Procedure: Transesophageal Echo, Cardiac Doppler and Color Doppler Indications:    Bacteremia R78.81  History:        Patient has prior history of Echocardiogram examinations, most                 recent 09/15/2023. CHF; Risk Factors:Hypertension, Diabetes and                 Dyslipidemia. ESRD on dialysis [facility]), Breast cancer (HCC), OSA                 (obstructive sleep apnea), Covid-19.  Sonographer:    [name] RCS Referring Phys: [id number] [name] PROCEDURE: After discussion of the risks and benefits of a TEE, an informed consent was obtained from the patient. TEE procedure time was 7 minutes. The transesophogeal probe was passed without difficulty through the esophogus of the patient. Imaged were  obtained with the patient in a left lateral decubitus  intact. CARTILAGE Patellofemoral: Within the limitations of motion artifact, no definite cartilage defect is seen. Medial: Moderate thinning of the posterior non weight-bearing medial femoral condyle cartilage with multiple subchondral cysts (sagittal series 17, image 22 and coronal series 13, image 14). Lateral:  No cartilage defect is seen. Joint: Nojoint effusion. Normal Hoffa's fat pad. No plical thickening. Popliteal Fossa:  No Baker's cyst. Extensor Mechanism:  Intact quadriceps tendon and patellar tendon. Bones:  No acute fracture or dislocation. No definite sinus tract is seen. No cortical erosion is seen. Other: None. IMPRESSION: 1. 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 This appears to contact the inferior aspect of the posterior wall of the posterior horn of the medial meniscus, however no tear is seen extending through an articular surface of the medial meniscus. 2. Intrasubstance degeneration throughout the root of the anterior horn of the lateral meniscus. No tear is seen extending through an articular surface of the lateral meniscus. 3. Mild proximal anteromedial collateral ligament sprain with mild surrounding soft tissue edema. 4. Moderate thinning of the posterior non weight-bearing medial femoral condyle cartilage with multiple subchondral cysts. Of acute 5. No MRI evidence acute osteomyelitis. Electronically Signed   By: Neita Garnet M.D.   On: 09/20/2023 20:58   DG Knee 1-2 Views Right  Result Date: 09/18/2023 CLINICAL DATA:  Right knee pain.  No known injury. EXAM: RIGHT KNEE - 1-2 VIEW COMPARISON:  None Available. FINDINGS: There is diffuse decreased bone mineralization. No joint effusion. Joint spaces are preserved. No acute fracture or dislocation. High-grade atherosclerotic calcifications. IMPRESSION: 1. No significant osteoarthritis. 2. No acute fracture or dislocation. 3. High-grade  atherosclerotic calcifications. Electronically Signed   By: Neita Garnet M.D.   On: 09/18/2023 17:59   ECHO TEE  Result Date: 09/18/2023    TRANSESOPHOGEAL ECHO REPORT   Patient Name:   [facility] Date of Exam: 09/18/2023 Medical Rec #:  [id number]   Height:       63.0 in Accession #:    [id number]  Weight:       247.1 lb Date of Birth:  1970/12/30  BSA:          2.116 m Patient Age:    52 years    BP:           165/81 mmHg Patient Gender: F           HR:           91 bpm. [facility] Procedure: Transesophageal Echo, Cardiac Doppler and Color Doppler Indications:    Bacteremia R78.81  History:        Patient has prior history of Echocardiogram examinations, most                 recent 09/15/2023. CHF; Risk Factors:Hypertension, Diabetes and                 Dyslipidemia. ESRD on dialysis [facility]), Breast cancer (HCC), OSA                 (obstructive sleep apnea), Covid-19.  Sonographer:    [name] RCS Referring Phys: [id number] [name] PROCEDURE: After discussion of the risks and benefits of a TEE, an informed consent was obtained from the patient. TEE procedure time was 7 minutes. The transesophogeal probe was passed without difficulty through the esophogus of the patient. Imaged were  obtained with the patient in a left lateral decubitus  intact. CARTILAGE Patellofemoral: Within the limitations of motion artifact, no definite cartilage defect is seen. Medial: Moderate thinning of the posterior non weight-bearing medial femoral condyle cartilage with multiple subchondral cysts (sagittal series 17, image 22 and coronal series 13, image 14). Lateral:  No cartilage defect is seen. Joint: Nojoint effusion. Normal Hoffa's fat pad. No plical thickening. Popliteal Fossa:  No Baker's cyst. Extensor Mechanism:  Intact quadriceps tendon and patellar tendon. Bones:  No acute fracture or dislocation. No definite sinus tract is seen. No cortical erosion is seen. Other: None. IMPRESSION: 1. 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 This appears to contact the inferior aspect of the posterior wall of the posterior horn of the medial meniscus, however no tear is seen extending through an articular surface of the medial meniscus. 2. Intrasubstance degeneration throughout the root of the anterior horn of the lateral meniscus. No tear is seen extending through an articular surface of the lateral meniscus. 3. Mild proximal anteromedial collateral ligament sprain with mild surrounding soft tissue edema. 4. Moderate thinning of the posterior non weight-bearing medial femoral condyle cartilage with multiple subchondral cysts. Of acute 5. No MRI evidence acute osteomyelitis. Electronically Signed   By: Neita Garnet M.D.   On: 09/20/2023 20:58   DG Knee 1-2 Views Right  Result Date: 09/18/2023 CLINICAL DATA:  Right knee pain.  No known injury. EXAM: RIGHT KNEE - 1-2 VIEW COMPARISON:  None Available. FINDINGS: There is diffuse decreased bone mineralization. No joint effusion. Joint spaces are preserved. No acute fracture or dislocation. High-grade atherosclerotic calcifications. IMPRESSION: 1. No significant osteoarthritis. 2. No acute fracture or dislocation. 3. High-grade  atherosclerotic calcifications. Electronically Signed   By: Neita Garnet M.D.   On: 09/18/2023 17:59   ECHO TEE  Result Date: 09/18/2023    TRANSESOPHOGEAL ECHO REPORT   Patient Name:   [facility] Date of Exam: 09/18/2023 Medical Rec #:  [id number]   Height:       63.0 in Accession #:    [id number]  Weight:       247.1 lb Date of Birth:  1970/12/30  BSA:          2.116 m Patient Age:    52 years    BP:           165/81 mmHg Patient Gender: F           HR:           91 bpm. [facility] Procedure: Transesophageal Echo, Cardiac Doppler and Color Doppler Indications:    Bacteremia R78.81  History:        Patient has prior history of Echocardiogram examinations, most                 recent 09/15/2023. CHF; Risk Factors:Hypertension, Diabetes and                 Dyslipidemia. ESRD on dialysis [facility]), Breast cancer (HCC), OSA                 (obstructive sleep apnea), Covid-19.  Sonographer:    [name] RCS Referring Phys: [id number] [name] PROCEDURE: After discussion of the risks and benefits of a TEE, an informed consent was obtained from the patient. TEE procedure time was 7 minutes. The transesophogeal probe was passed without difficulty through the esophogus of the patient. Imaged were  obtained with the patient in a left lateral decubitus  intact. CARTILAGE Patellofemoral: Within the limitations of motion artifact, no definite cartilage defect is seen. Medial: Moderate thinning of the posterior non weight-bearing medial femoral condyle cartilage with multiple subchondral cysts (sagittal series 17, image 22 and coronal series 13, image 14). Lateral:  No cartilage defect is seen. Joint: Nojoint effusion. Normal Hoffa's fat pad. No plical thickening. Popliteal Fossa:  No Baker's cyst. Extensor Mechanism:  Intact quadriceps tendon and patellar tendon. Bones:  No acute fracture or dislocation. No definite sinus tract is seen. No cortical erosion is seen. Other: None. IMPRESSION: 1. 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 This appears to contact the inferior aspect of the posterior wall of the posterior horn of the medial meniscus, however no tear is seen extending through an articular surface of the medial meniscus. 2. Intrasubstance degeneration throughout the root of the anterior horn of the lateral meniscus. No tear is seen extending through an articular surface of the lateral meniscus. 3. Mild proximal anteromedial collateral ligament sprain with mild surrounding soft tissue edema. 4. Moderate thinning of the posterior non weight-bearing medial femoral condyle cartilage with multiple subchondral cysts. Of acute 5. No MRI evidence acute osteomyelitis. Electronically Signed   By: Neita Garnet M.D.   On: 09/20/2023 20:58   DG Knee 1-2 Views Right  Result Date: 09/18/2023 CLINICAL DATA:  Right knee pain.  No known injury. EXAM: RIGHT KNEE - 1-2 VIEW COMPARISON:  None Available. FINDINGS: There is diffuse decreased bone mineralization. No joint effusion. Joint spaces are preserved. No acute fracture or dislocation. High-grade atherosclerotic calcifications. IMPRESSION: 1. No significant osteoarthritis. 2. No acute fracture or dislocation. 3. High-grade  atherosclerotic calcifications. Electronically Signed   By: Neita Garnet M.D.   On: 09/18/2023 17:59   ECHO TEE  Result Date: 09/18/2023    TRANSESOPHOGEAL ECHO REPORT   Patient Name:   [facility] Date of Exam: 09/18/2023 Medical Rec #:  [id number]   Height:       63.0 in Accession #:    [id number]  Weight:       247.1 lb Date of Birth:  1970/12/30  BSA:          2.116 m Patient Age:    52 years    BP:           165/81 mmHg Patient Gender: F           HR:           91 bpm. [facility] Procedure: Transesophageal Echo, Cardiac Doppler and Color Doppler Indications:    Bacteremia R78.81  History:        Patient has prior history of Echocardiogram examinations, most                 recent 09/15/2023. CHF; Risk Factors:Hypertension, Diabetes and                 Dyslipidemia. ESRD on dialysis [facility]), Breast cancer (HCC), OSA                 (obstructive sleep apnea), Covid-19.  Sonographer:    [name] RCS Referring Phys: [id number] [name] PROCEDURE: After discussion of the risks and benefits of a TEE, an informed consent was obtained from the patient. TEE procedure time was 7 minutes. The transesophogeal probe was passed without difficulty through the esophogus of the patient. Imaged were  obtained with the patient in a left lateral decubitus  position. Local oropharyngeal anesthetic was provided with viscous lidocaine. Sedation performed by different physician. The patient was monitored while under deep sedation. Image quality was good. The patient's vital signs; including heart rate, blood pressure, and oxygen saturation; remained stable throughout the procedure. The patient developed no complications during the procedure.  IMPRESSIONS  1. Left ventricular ejection fraction, by estimation, is 60 to 65%. The left ventricle has normal function. The left ventricle has no regional wall motion abnormalities. There is mild concentric left ventricular hypertrophy.  2. Right ventricular systolic  function is normal. The right ventricular size is normal.  3. Left atrial size was mildly dilated. No left atrial/left atrial appendage thrombus was detected. The LAA emptying velocity was 70 cm/s.  4. Right atrial size was mildly dilated.  5. The mitral valve is grossly normal. Trivial mitral valve regurgitation.  6. The aortic valve is tricuspid. There is mild calcification of the aortic valve. Aortic valve regurgitation is not visualized. Aortic valve sclerosis/calcification is present, without any evidence of aortic stenosis.  7. There is Moderate (Grade III) plaque involving the descending aorta. Conclusion(s)/Recommendation(s): No valvular vegetations. FINDINGS  Left Ventricle: Left ventricular ejection fraction, by estimation, is 60 to 65%. The left ventricle has normal function. The left ventricle has no regional wall motion abnormalities. The left ventricular internal cavity size was normal in size. There is  mild concentric left ventricular hypertrophy. Right Ventricle: The right ventricular size is normal. Right vetricular wall thickness was not well visualized. Right ventricular systolic function is normal. Left Atrium: Left atrial size was mildly dilated. No left atrial/left atrial appendage thrombus was detected. The LAA emptying velocity was 70 cm/s. Right Atrium: Right atrial size was mildly dilated. Pericardium: There is no evidence of pericardial effusion. Mitral Valve: The mitral valve is grossly normal. Trivial mitral valve regurgitation. Tricuspid Valve: The tricuspid valve is grossly normal. Tricuspid valve regurgitation is mild. Aortic Valve: The aortic valve is tricuspid. There is mild calcification of the aortic valve. Aortic valve regurgitation is not visualized. Aortic valve sclerosis/calcification is present, without any evidence of aortic stenosis. Pulmonic Valve: The pulmonic valve was grossly normal. Pulmonic valve regurgitation is trivial. Aorta: The aortic root is normal in size and  structure. There is moderate (Grade III) plaque involving the descending aorta. IAS/Shunts: No atrial level shunt detected by color flow Doppler. Additional Comments: Spectral Doppler performed. Nona Dell MD Electronically signed by Nona Dell MD Signature Date/Time: 09/18/2023/12:57:59 PM    Final    DG Chest Port 1 View  Result Date: 09/16/2023 CLINICAL DATA:  Encounter removal of vascular catheter. EXAM: PORTABLE CHEST 1 VIEW COMPARISON:  Radiograph 06/14/2023 FINDINGS: The previous left-sided dialysis catheter is been removed. No evidence of pneumothorax. Soft tissue attenuation from habitus limits detailed assessment. Cardiomegaly is stable. Suspected vascular congestion. No large pleural effusion. IMPRESSION: 1. The left-sided dialysis catheter has been removed. No evidence of pneumothorax. 2. Cardiomegaly with vascular congestion. Electronically Signed   By: Narda Rutherford M.D.   On: 09/16/2023 19:14   ECHOCARDIOGRAM COMPLETE  Result Date: 09/15/2023    ECHOCARDIOGRAM REPORT   Patient Name:   [facility] Date of Exam: 09/15/2023 Medical Rec #:  [id number]   Height:       63.0 in Accession #:    [id number]  Weight:       245.0 lb Date of Birth:  1971/12/07  BSA:          2.108 m Patient Age:    39 years  position. Local oropharyngeal anesthetic was provided with viscous lidocaine. Sedation performed by different physician. The patient was monitored while under deep sedation. Image quality was good. The patient's vital signs; including heart rate, blood pressure, and oxygen saturation; remained stable throughout the procedure. The patient developed no complications during the procedure.  IMPRESSIONS  1. Left ventricular ejection fraction, by estimation, is 60 to 65%. The left ventricle has normal function. The left ventricle has no regional wall motion abnormalities. There is mild concentric left ventricular hypertrophy.  2. Right ventricular systolic  function is normal. The right ventricular size is normal.  3. Left atrial size was mildly dilated. No left atrial/left atrial appendage thrombus was detected. The LAA emptying velocity was 70 cm/s.  4. Right atrial size was mildly dilated.  5. The mitral valve is grossly normal. Trivial mitral valve regurgitation.  6. The aortic valve is tricuspid. There is mild calcification of the aortic valve. Aortic valve regurgitation is not visualized. Aortic valve sclerosis/calcification is present, without any evidence of aortic stenosis.  7. There is Moderate (Grade III) plaque involving the descending aorta. Conclusion(s)/Recommendation(s): No valvular vegetations. FINDINGS  Left Ventricle: Left ventricular ejection fraction, by estimation, is 60 to 65%. The left ventricle has normal function. The left ventricle has no regional wall motion abnormalities. The left ventricular internal cavity size was normal in size. There is  mild concentric left ventricular hypertrophy. Right Ventricle: The right ventricular size is normal. Right vetricular wall thickness was not well visualized. Right ventricular systolic function is normal. Left Atrium: Left atrial size was mildly dilated. No left atrial/left atrial appendage thrombus was detected. The LAA emptying velocity was 70 cm/s. Right Atrium: Right atrial size was mildly dilated. Pericardium: There is no evidence of pericardial effusion. Mitral Valve: The mitral valve is grossly normal. Trivial mitral valve regurgitation. Tricuspid Valve: The tricuspid valve is grossly normal. Tricuspid valve regurgitation is mild. Aortic Valve: The aortic valve is tricuspid. There is mild calcification of the aortic valve. Aortic valve regurgitation is not visualized. Aortic valve sclerosis/calcification is present, without any evidence of aortic stenosis. Pulmonic Valve: The pulmonic valve was grossly normal. Pulmonic valve regurgitation is trivial. Aorta: The aortic root is normal in size and  structure. There is moderate (Grade III) plaque involving the descending aorta. IAS/Shunts: No atrial level shunt detected by color flow Doppler. Additional Comments: Spectral Doppler performed. Nona Dell MD Electronically signed by Nona Dell MD Signature Date/Time: 09/18/2023/12:57:59 PM    Final    DG Chest Port 1 View  Result Date: 09/16/2023 CLINICAL DATA:  Encounter removal of vascular catheter. EXAM: PORTABLE CHEST 1 VIEW COMPARISON:  Radiograph 06/14/2023 FINDINGS: The previous left-sided dialysis catheter is been removed. No evidence of pneumothorax. Soft tissue attenuation from habitus limits detailed assessment. Cardiomegaly is stable. Suspected vascular congestion. No large pleural effusion. IMPRESSION: 1. The left-sided dialysis catheter has been removed. No evidence of pneumothorax. 2. Cardiomegaly with vascular congestion. Electronically Signed   By: Narda Rutherford M.D.   On: 09/16/2023 19:14   ECHOCARDIOGRAM COMPLETE  Result Date: 09/15/2023    ECHOCARDIOGRAM REPORT   Patient Name:   [facility] Date of Exam: 09/15/2023 Medical Rec #:  [id number]   Height:       63.0 in Accession #:    [id number]  Weight:       245.0 lb Date of Birth:  1971/12/07  BSA:          2.108 m Patient Age:    39 years  intact. CARTILAGE Patellofemoral: Within the limitations of motion artifact, no definite cartilage defect is seen. Medial: Moderate thinning of the posterior non weight-bearing medial femoral condyle cartilage with multiple subchondral cysts (sagittal series 17, image 22 and coronal series 13, image 14). Lateral:  No cartilage defect is seen. Joint: Nojoint effusion. Normal Hoffa's fat pad. No plical thickening. Popliteal Fossa:  No Baker's cyst. Extensor Mechanism:  Intact quadriceps tendon and patellar tendon. Bones:  No acute fracture or dislocation. No definite sinus tract is seen. No cortical erosion is seen. Other: None. IMPRESSION: 1. 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 This appears to contact the inferior aspect of the posterior wall of the posterior horn of the medial meniscus, however no tear is seen extending through an articular surface of the medial meniscus. 2. Intrasubstance degeneration throughout the root of the anterior horn of the lateral meniscus. No tear is seen extending through an articular surface of the lateral meniscus. 3. Mild proximal anteromedial collateral ligament sprain with mild surrounding soft tissue edema. 4. Moderate thinning of the posterior non weight-bearing medial femoral condyle cartilage with multiple subchondral cysts. Of acute 5. No MRI evidence acute osteomyelitis. Electronically Signed   By: Neita Garnet M.D.   On: 09/20/2023 20:58   DG Knee 1-2 Views Right  Result Date: 09/18/2023 CLINICAL DATA:  Right knee pain.  No known injury. EXAM: RIGHT KNEE - 1-2 VIEW COMPARISON:  None Available. FINDINGS: There is diffuse decreased bone mineralization. No joint effusion. Joint spaces are preserved. No acute fracture or dislocation. High-grade atherosclerotic calcifications. IMPRESSION: 1. No significant osteoarthritis. 2. No acute fracture or dislocation. 3. High-grade  atherosclerotic calcifications. Electronically Signed   By: Neita Garnet M.D.   On: 09/18/2023 17:59   ECHO TEE  Result Date: 09/18/2023    TRANSESOPHOGEAL ECHO REPORT   Patient Name:   [facility] Date of Exam: 09/18/2023 Medical Rec #:  [id number]   Height:       63.0 in Accession #:    [id number]  Weight:       247.1 lb Date of Birth:  1970/12/30  BSA:          2.116 m Patient Age:    52 years    BP:           165/81 mmHg Patient Gender: F           HR:           91 bpm. [facility] Procedure: Transesophageal Echo, Cardiac Doppler and Color Doppler Indications:    Bacteremia R78.81  History:        Patient has prior history of Echocardiogram examinations, most                 recent 09/15/2023. CHF; Risk Factors:Hypertension, Diabetes and                 Dyslipidemia. ESRD on dialysis [facility]), Breast cancer (HCC), OSA                 (obstructive sleep apnea), Covid-19.  Sonographer:    [name] RCS Referring Phys: [id number] [name] PROCEDURE: After discussion of the risks and benefits of a TEE, an informed consent was obtained from the patient. TEE procedure time was 7 minutes. The transesophogeal probe was passed without difficulty through the esophogus of the patient. Imaged were  obtained with the patient in a left lateral decubitus  intact. CARTILAGE Patellofemoral: Within the limitations of motion artifact, no definite cartilage defect is seen. Medial: Moderate thinning of the posterior non weight-bearing medial femoral condyle cartilage with multiple subchondral cysts (sagittal series 17, image 22 and coronal series 13, image 14). Lateral:  No cartilage defect is seen. Joint: Nojoint effusion. Normal Hoffa's fat pad. No plical thickening. Popliteal Fossa:  No Baker's cyst. Extensor Mechanism:  Intact quadriceps tendon and patellar tendon. Bones:  No acute fracture or dislocation. No definite sinus tract is seen. No cortical erosion is seen. Other: None. IMPRESSION: 1. 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 This appears to contact the inferior aspect of the posterior wall of the posterior horn of the medial meniscus, however no tear is seen extending through an articular surface of the medial meniscus. 2. Intrasubstance degeneration throughout the root of the anterior horn of the lateral meniscus. No tear is seen extending through an articular surface of the lateral meniscus. 3. Mild proximal anteromedial collateral ligament sprain with mild surrounding soft tissue edema. 4. Moderate thinning of the posterior non weight-bearing medial femoral condyle cartilage with multiple subchondral cysts. Of acute 5. No MRI evidence acute osteomyelitis. Electronically Signed   By: Neita Garnet M.D.   On: 09/20/2023 20:58   DG Knee 1-2 Views Right  Result Date: 09/18/2023 CLINICAL DATA:  Right knee pain.  No known injury. EXAM: RIGHT KNEE - 1-2 VIEW COMPARISON:  None Available. FINDINGS: There is diffuse decreased bone mineralization. No joint effusion. Joint spaces are preserved. No acute fracture or dislocation. High-grade atherosclerotic calcifications. IMPRESSION: 1. No significant osteoarthritis. 2. No acute fracture or dislocation. 3. High-grade  atherosclerotic calcifications. Electronically Signed   By: Neita Garnet M.D.   On: 09/18/2023 17:59   ECHO TEE  Result Date: 09/18/2023    TRANSESOPHOGEAL ECHO REPORT   Patient Name:   [facility] Date of Exam: 09/18/2023 Medical Rec #:  [id number]   Height:       63.0 in Accession #:    [id number]  Weight:       247.1 lb Date of Birth:  1970/12/30  BSA:          2.116 m Patient Age:    52 years    BP:           165/81 mmHg Patient Gender: F           HR:           91 bpm. [facility] Procedure: Transesophageal Echo, Cardiac Doppler and Color Doppler Indications:    Bacteremia R78.81  History:        Patient has prior history of Echocardiogram examinations, most                 recent 09/15/2023. CHF; Risk Factors:Hypertension, Diabetes and                 Dyslipidemia. ESRD on dialysis [facility]), Breast cancer (HCC), OSA                 (obstructive sleep apnea), Covid-19.  Sonographer:    [name] RCS Referring Phys: [id number] [name] PROCEDURE: After discussion of the risks and benefits of a TEE, an informed consent was obtained from the patient. TEE procedure time was 7 minutes. The transesophogeal probe was passed without difficulty through the esophogus of the patient. Imaged were  obtained with the patient in a left lateral decubitus  intact. CARTILAGE Patellofemoral: Within the limitations of motion artifact, no definite cartilage defect is seen. Medial: Moderate thinning of the posterior non weight-bearing medial femoral condyle cartilage with multiple subchondral cysts (sagittal series 17, image 22 and coronal series 13, image 14). Lateral:  No cartilage defect is seen. Joint: Nojoint effusion. Normal Hoffa's fat pad. No plical thickening. Popliteal Fossa:  No Baker's cyst. Extensor Mechanism:  Intact quadriceps tendon and patellar tendon. Bones:  No acute fracture or dislocation. No definite sinus tract is seen. No cortical erosion is seen. Other: None. IMPRESSION: 1. 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 This appears to contact the inferior aspect of the posterior wall of the posterior horn of the medial meniscus, however no tear is seen extending through an articular surface of the medial meniscus. 2. Intrasubstance degeneration throughout the root of the anterior horn of the lateral meniscus. No tear is seen extending through an articular surface of the lateral meniscus. 3. Mild proximal anteromedial collateral ligament sprain with mild surrounding soft tissue edema. 4. Moderate thinning of the posterior non weight-bearing medial femoral condyle cartilage with multiple subchondral cysts. Of acute 5. No MRI evidence acute osteomyelitis. Electronically Signed   By: Neita Garnet M.D.   On: 09/20/2023 20:58   DG Knee 1-2 Views Right  Result Date: 09/18/2023 CLINICAL DATA:  Right knee pain.  No known injury. EXAM: RIGHT KNEE - 1-2 VIEW COMPARISON:  None Available. FINDINGS: There is diffuse decreased bone mineralization. No joint effusion. Joint spaces are preserved. No acute fracture or dislocation. High-grade atherosclerotic calcifications. IMPRESSION: 1. No significant osteoarthritis. 2. No acute fracture or dislocation. 3. High-grade  atherosclerotic calcifications. Electronically Signed   By: Neita Garnet M.D.   On: 09/18/2023 17:59   ECHO TEE  Result Date: 09/18/2023    TRANSESOPHOGEAL ECHO REPORT   Patient Name:   [facility] Date of Exam: 09/18/2023 Medical Rec #:  [id number]   Height:       63.0 in Accession #:    [id number]  Weight:       247.1 lb Date of Birth:  1970/12/30  BSA:          2.116 m Patient Age:    52 years    BP:           165/81 mmHg Patient Gender: F           HR:           91 bpm. [facility] Procedure: Transesophageal Echo, Cardiac Doppler and Color Doppler Indications:    Bacteremia R78.81  History:        Patient has prior history of Echocardiogram examinations, most                 recent 09/15/2023. CHF; Risk Factors:Hypertension, Diabetes and                 Dyslipidemia. ESRD on dialysis [facility]), Breast cancer (HCC), OSA                 (obstructive sleep apnea), Covid-19.  Sonographer:    [name] RCS Referring Phys: [id number] [name] PROCEDURE: After discussion of the risks and benefits of a TEE, an informed consent was obtained from the patient. TEE procedure time was 7 minutes. The transesophogeal probe was passed without difficulty through the esophogus of the patient. Imaged were  obtained with the patient in a left lateral decubitus

## 2023-09-24 NOTE — Progress Notes (Signed)
Patient ID: Patricia [name], female   DOB: 12-12-1971, 52 y.o.   MRN: [id number] S: Patricia [name] is at [facility] for tunneled HD catheter placement and will come back to [facility] for HD later today. O:BP 125/65 (BP Location: Left Leg)   Pulse 76   Temp 98.4 F (36.9 C) (Oral)   Resp 20   Ht 5\' 3"  (1.6 m)   Wt 111.3 kg   SpO2 96%   BMI 43.47 kg/m   Intake/Output Summary (Last 24 hours) at 09/24/2023 1018 Last data filed at 09/23/2023 1730 Gross per 24 hour  Intake 480 ml  Output --  Net 480 ml   Intake/Output: I/O last 3 completed shifts: In: 480 [P.O.:480] Out: -   Intake/Output this shift:  No intake/output data recorded. Weight change:    Recent Labs  Lab 09/18/23 0856 09/19/23 1640 09/21/23 0556 09/21/23 1410 09/21/23 1411 09/22/23 0348 09/23/23 0352 09/24/23 0409  NA 131* 132*  --  132* 132* 132* 129* 131*  K 4.1 3.9  --  4.3 4.2 4.5 4.4 4.8  CL 90* 88*  --  90* 90* 92* 89* 91*  CO2 21* 22  --  22 21* 23 21* 20*  GLUCOSE 235* 367*  --  326* 326* 289* 279* 218*  BUN 79* 98*  --  75* 75* 65* 73* 86*  CREATININE 11.94* 15.60* 12.24* 12.96* 13.00* 11.62* 13.58* 15.73*  ALBUMIN  --  3.2*  --   --  3.6 3.4* 3.3* 3.5  CALCIUM 9.1 9.6  --  10.3 10.4* 9.6 9.2 9.5  PHOS  --  6.5*  --   --  6.9* 5.9* 6.3* 7.1*   Liver Function Tests: Recent Labs  Lab 09/22/23 0348 09/23/23 0352 09/24/23 0409  ALBUMIN 3.4* 3.3* 3.5   No results for input(s): "LIPASE", "AMYLASE" in the last 168 hours. No results for input(s): "AMMONIA" in the last 168 hours. CBC: Recent Labs  Lab 09/18/23 1622 09/19/23 1640 09/21/23 1410 09/22/23 0348  WBC 15.0* 15.9* 20.3* 17.6*  HGB 8.7* 8.3* 9.0* 8.8*  HCT 28.1* 27.3* 29.2* 29.1*  MCV 90.6 91.0 91.0 91.5  PLT 231 240 345 367   Cardiac Enzymes: No results for input(s): "CKTOTAL", "CKMB", "CKMBINDEX", "TROPONINI" in the last 168 hours. CBG: Recent Labs  Lab 09/23/23 0741 09/23/23 1154 09/23/23 1647 09/23/23 1931 09/24/23 0733  GLUCAP 341* 302*  312* 317* 204*    Iron Studies: No results for input(s): "IRON", "TIBC", "TRANSFERRIN", "FERRITIN" in the last 72 hours. Studies/Results: CT ABDOMEN PELVIS WO CONTRAST  Result Date: 09/23/2023 CLINICAL DATA:  Abdominal pain and vomiting. Suspect bowel obstruction. Previously evaluated for chronic abdominal pain. She has a history of breast cancer and end-stage renal failure on dialysis. EXAM: CT ABDOMEN AND PELVIS WITHOUT CONTRAST TECHNIQUE: Multidetector CT imaging of the abdomen and pelvis was performed following the standard protocol without IV contrast. RADIATION DOSE REDUCTION: This exam was performed according to the departmental dose-optimization program which includes automated exposure control, adjustment of the mA and/or kV according to patient size and/or use of iterative reconstruction technique. COMPARISON:  CT abdomen and pelvis without contrast 02/24/2023, CT abdomen and pelvis with contrast 07/02/2019. FINDINGS: Lower chest: There is a new rounded solid nodule in the anterior basal segment of the left lower lobe measuring 1.3 cm, and a new pleural-based 1.2 cm nodule posteromedially in the right lower lobe on 4:19. Given the patient's prior breast cancer history, the usual Fleischner Society guidelines do not apply. Chest CT is recommended  to evaluate for other nodules. Scattered linear scarring or atelectasis is again noted in both bases. Again noted mosaicism consistent with air trapping and small airways disease. Lung bases are otherwise clear. There are postsurgical changes partially visualized consistent with left mastectomy. The heart is slightly enlarged but unchanged. There is scattered calcification mitral ring. No pericardial effusion. Hepatobiliary: The liver is 21 cm length with homogeneous noncontrast attenuation. No masses seen without contrast. The gallbladder again surgically absent without biliary dilatation. Pancreas: No mass is seen without contrast. Spleen: No masses seen  without contrast.  No splenomegaly. Adrenals/Urinary Tract: No adrenal mass. Both kidneys are atrophic consistent with end-stage renal disease with heavy renovascular calcifications. There is no contour deforming mass of either kidney. No urinary stones or obstruction. Stomach/Bowel: Enteric contrast was administered prior to imaging. No dilatation or wall thickening is seen including of the appendix. There is moderate retained stool transverse, descending and rectosigmoid colon, scattered diverticulosis without evidence of diverticulitis. Vascular/Lymphatic: Patchy aortoiliac and visceral branch vessel atherosclerosis without AAA there is a left femoral dialysis catheter with its tip in the left common iliac vein distal segment. No appreciable adenopathy. Reproductive: Status post hysterectomy. No adnexal masses. Other: There is a small umbilical fat hernia. Additional small fat hernia of the midline low anterior pelvic wall. There are no incarcerated hernias. There is an overhanging abdominal wall fat panniculus showing increased stranding and skin thickening right of the midline. This could be due to 1 or more recent injections, other local trauma or infectious process. No abscess is seen. Within the abdomen and pelvis proper there is no free fluid, free hemorrhage, free air or focal inflammatory process. Musculoskeletal: There are degenerative changes of the thoracic and lumbar spine but no destructive bone lesions. Heterotopic soft tissue bone formations are noted the anterior to both proximal femurs likely due to remote trauma. IMPRESSION: 1. No acute noncontrast CT findings in the abdomen or pelvis. 2. Constipation and diverticulosis. No bowel obstruction or inflammation. 3. Umbilical and low anterior pelvic wall fat hernias. 4. Overhanging abdominal wall fat panniculus with increased stranding and skin thickening right of the midline. This could be due to 1 or more recent injections, other local trauma or  infectious process. No abscess is seen. 5. New 1.3 cm left lower lobe and 1.2 cm right lower lobe nodules. Chest CT is recommended to evaluate for other nodules. 6. Aortic and coronary artery atherosclerosis. 7. End-stage renal disease with atrophic kidneys and heavy renovascular calcifications. 8. Left femoral dialysis catheter with its tip in the distal left common iliac vein. Aortic Atherosclerosis (ICD10-I70.0). Electronically Signed   By: Almira Bar M.D.   On: 09/23/2023 01:38    aspirin EC  81 mg Oral Daily   Chlorhexidine Gluconate Cloth  6 each Topical Q0600   cinacalcet  30 mg Oral Q breakfast   diclofenac Sodium  4 g Topical QID   heparin injection (subcutaneous)  5,000 Units Subcutaneous Q8H   insulin aspart  0-5 Units Subcutaneous QHS   insulin aspart  0-6 Units Subcutaneous TID WC   insulin glargine-yfgn  6 Units Subcutaneous BID   labetalol  100 mg Oral BID   linaclotide  290 mcg Oral QAC breakfast   loratadine  10 mg Oral Daily   montelukast  10 mg Oral QHS   ondansetron (ZOFRAN) IV  4 mg Intravenous Q6H   pantoprazole (PROTONIX) IV  40 mg Intravenous Q12H   polyethylene glycol  17 g Oral Daily   rOPINIRole  2 mg Oral QHS   rosuvastatin  20 mg Oral Daily   senna  2 tablet Oral Daily   sevelamer carbonate  1,600 mg Oral BID WC   sevelamer carbonate  2,400 mg Oral Q lunch    BMET    Component Value Date/Time   NA 131 (L) 09/24/2023 0409   K 4.8 09/24/2023 0409   CL 91 (L) 09/24/2023 0409   CO2 20 (L) 09/24/2023 0409   GLUCOSE 218 (H) 09/24/2023 0409   BUN 86 (H) 09/24/2023 0409   CREATININE 15.73 (H) 09/24/2023 0409   CREATININE 5.93 (H) 06/29/2019 1210   CALCIUM 9.5 09/24/2023 0409   GFRNONAA 2 (L) 09/24/2023 0409   GFRNONAA 8 (L) 06/29/2019 1210   GFRAA 4 (L) 08/04/2020 0452   GFRAA 9 (L) 06/29/2019 1210   CBC    Component Value Date/Time   WBC 17.6 (H) 09/22/2023 0348   RBC 3.18 (L) 09/22/2023 0348   HGB 8.8 (L) 09/22/2023 0348   HCT 29.1 (L)  09/22/2023 0348   PLT 367 09/22/2023 0348   MCV 91.5 09/22/2023 0348   MCH 27.7 09/22/2023 0348   MCHC 30.2 09/22/2023 0348   RDW 14.6 09/22/2023 0348   LYMPHSABS 0.3 (L) 09/14/2023 1716   MONOABS 1.0 09/14/2023 1716   EOSABS 1.2 (H) 09/14/2023 1716   BASOSABS 0.1 09/14/2023 1716    Assessment/Plan:   MSSA sepsis/bacteremia - presumably due to [facility] exit site and tunnel infection.  Currently on ancef.  TDC removed 09/16/23 and had temp left femoral HD catheter placed 09/19/23 by Dr. [name] due to leukocytosis.  09/19/23 B Cx x2 NGTD.  RCID suggests cefazolin with HD x6wk, last date 11/13, will f/u with ID 10/28/23.  Ready for new TDC to be placed by IR today.  TEE negative for vegetations.   ESRD - normally on MWF.  Femoral catheter functioning poorly.  Plan will be hopeful TDC today and dialysis thereafter.  Will eventually get back on MWF schedule. 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  Unable to cannulate venous portion.  As above  Irena Cords, MD [facility]

## 2023-09-24 NOTE — Care Management Important Message (Signed)
Important Message  Patient Details  Name: Patricia [name] MRN: [id number] Date of Birth: Jun 22, 1971   Important Message Given:  Yes - Medicare IM     Corey Harold 09/24/2023, 11:29 AM

## 2023-09-24 NOTE — Procedures (Signed)
Interventional Radiology Procedure Note  Procedure: Right EJ tunneld HD catheter  Indication: Renal failure  Findings:   Please refer to procedural dictation for full description. Tip in superior right atrium. Catheter is ready for use.  Complications: None  EBL: < 10 mL  Acquanetta Belling, MD [phone]

## 2023-09-24 NOTE — Procedures (Signed)
Temp cath removal post procedure.  Heparin removed, gauze applied with complete hemostasis.  Tegaderm applied with no complications.

## 2023-09-24 NOTE — Consult Note (Signed)
[facility] Customer service manager [facility]) Accountable Care Organization (ACO) [facility] Liaison Note  09/24/2023  Cortnee Steinmiller Jul 26, 1971 [id number]  Location: [facility] Liaison screened the patient remotely at [facility].  Insurance:  Medicare   Patricia [name] is a 52 y.o. 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 [facility]). The patient was screened for readmission hospitalization with noted extreme risk score for unplanned readmission risk with 3 IP in 6 months.  The patient was assessed for potential [facility]) Care Management service needs for post hospital transition for care coordination. Review of patient's electronic medical record reveals patient was admitted for Sepsis. Liaison attempted outreach however unsuccessful. Pt will discharged home with self-care today. Note dialysis on M-W-F with [facility] in [place]. No further documentation of concerned needs at this time. Due to diagnosis will refer to care management services for further screen and updates,  Plan: [facility] Liaison will continue to follow progress and disposition to asess for post hospital community care coordination/management needs.  Referral request for community care coordination: Will make a referral for care coordinator to completed a post hospital prevention readmission follow up call.    [facility] Care Management/Population Health does not replace or interfere with any arrangements made by the Inpatient Transition of Care team.   For questions contact:   Elliot Cousin, RN, [facility] Liaison [place]   Population Health Office Hours MTWF  8:00 am-6:00 pm [phone] mobile [phone] [Office toll free line] Office Hours are M-F 8:30 - 5 pm [email or web address]

## 2023-09-25 ENCOUNTER — Encounter (HOSPITAL_COMMUNITY): Payer: Self-pay | Admitting: Cardiology

## 2023-09-25 DIAGNOSIS — Z23 Encounter for immunization: Secondary | ICD-10-CM | POA: Diagnosis not present

## 2023-09-25 DIAGNOSIS — R7881 Bacteremia: Secondary | ICD-10-CM | POA: Diagnosis not present

## 2023-09-25 DIAGNOSIS — N186 End stage renal disease: Secondary | ICD-10-CM | POA: Diagnosis not present

## 2023-09-25 DIAGNOSIS — B9689 Other specified bacterial agents as the cause of diseases classified elsewhere: Secondary | ICD-10-CM | POA: Diagnosis not present

## 2023-09-25 DIAGNOSIS — D631 Anemia in chronic kidney disease: Secondary | ICD-10-CM | POA: Diagnosis not present

## 2023-09-25 DIAGNOSIS — N2581 Secondary hyperparathyroidism of renal origin: Secondary | ICD-10-CM | POA: Diagnosis not present

## 2023-09-26 ENCOUNTER — Telehealth: Payer: Self-pay

## 2023-09-26 NOTE — Telephone Encounter (Signed)
Pt called stating she was recently discharged from [facility] where she had been treated for sepsis. Pt states her BG was elevated while in the hospital and has continued to be elevated since home.  Pt high BG readings.   Date Before breakfast Before lunch Before supper Bedtime  09/24/23 204 179    09/25/23 183     09/26/23 283             Pt taking: Glipizide 5mg  daily and Tresiba 20 units at bedtime. Pt states she continues to take an antibiotic and will finish regimen in November.

## 2023-09-26 NOTE — Telephone Encounter (Signed)
Spoke with pt advising her to increase her Tresiba to 30 units at bedtime per Dr.Nida's orders. Understanding voiced.

## 2023-09-27 ENCOUNTER — Telehealth: Payer: Self-pay | Admitting: *Deleted

## 2023-09-27 DIAGNOSIS — B9689 Other specified bacterial agents as the cause of diseases classified elsewhere: Secondary | ICD-10-CM | POA: Diagnosis not present

## 2023-09-27 DIAGNOSIS — N2581 Secondary hyperparathyroidism of renal origin: Secondary | ICD-10-CM | POA: Diagnosis not present

## 2023-09-27 DIAGNOSIS — D631 Anemia in chronic kidney disease: Secondary | ICD-10-CM | POA: Diagnosis not present

## 2023-09-27 DIAGNOSIS — N186 End stage renal disease: Secondary | ICD-10-CM | POA: Diagnosis not present

## 2023-09-27 DIAGNOSIS — Z992 Dependence on renal dialysis: Secondary | ICD-10-CM | POA: Diagnosis not present

## 2023-09-27 DIAGNOSIS — Z23 Encounter for immunization: Secondary | ICD-10-CM | POA: Diagnosis not present

## 2023-09-27 DIAGNOSIS — I70208 Unspecified atherosclerosis of native arteries of extremities, other extremity: Secondary | ICD-10-CM | POA: Diagnosis not present

## 2023-09-27 DIAGNOSIS — R7881 Bacteremia: Secondary | ICD-10-CM | POA: Diagnosis not present

## 2023-09-27 NOTE — Progress Notes (Signed)
Care Coordination  Outreach Note  09/27/2023 Name: Patricia [name] MRN: [id number] DOB: 03-07-1971   Care Coordination Outreach Attempts: An unsuccessful telephone outreach was attempted today to offer the patient information about available care coordination services.  Follow Up Plan:  Additional outreach attempts will be made to offer the patient care coordination information and services.   Encounter Outcome:  No Answer  Christie Nottingham  Care Coordination Care Guide  Direct Dial: [phone]

## 2023-09-30 DIAGNOSIS — N2581 Secondary hyperparathyroidism of renal origin: Secondary | ICD-10-CM | POA: Diagnosis not present

## 2023-09-30 DIAGNOSIS — R7881 Bacteremia: Secondary | ICD-10-CM | POA: Diagnosis not present

## 2023-09-30 DIAGNOSIS — D631 Anemia in chronic kidney disease: Secondary | ICD-10-CM | POA: Diagnosis not present

## 2023-09-30 DIAGNOSIS — N186 End stage renal disease: Secondary | ICD-10-CM | POA: Diagnosis not present

## 2023-09-30 DIAGNOSIS — Z23 Encounter for immunization: Secondary | ICD-10-CM | POA: Diagnosis not present

## 2023-09-30 DIAGNOSIS — B9689 Other specified bacterial agents as the cause of diseases classified elsewhere: Secondary | ICD-10-CM | POA: Diagnosis not present

## 2023-10-01 DIAGNOSIS — I10 Essential (primary) hypertension: Secondary | ICD-10-CM | POA: Diagnosis not present

## 2023-10-01 DIAGNOSIS — E1122 Type 2 diabetes mellitus with diabetic chronic kidney disease: Secondary | ICD-10-CM | POA: Diagnosis not present

## 2023-10-01 DIAGNOSIS — E119 Type 2 diabetes mellitus without complications: Secondary | ICD-10-CM | POA: Diagnosis not present

## 2023-10-01 DIAGNOSIS — N186 End stage renal disease: Secondary | ICD-10-CM | POA: Diagnosis not present

## 2023-10-01 DIAGNOSIS — E113513 Type 2 diabetes mellitus with proliferative diabetic retinopathy with macular edema, bilateral: Secondary | ICD-10-CM | POA: Diagnosis not present

## 2023-10-01 DIAGNOSIS — Z992 Dependence on renal dialysis: Secondary | ICD-10-CM | POA: Diagnosis not present

## 2023-10-01 DIAGNOSIS — Z6841 Body Mass Index (BMI) 40.0 and over, adult: Secondary | ICD-10-CM | POA: Diagnosis not present

## 2023-10-01 DIAGNOSIS — I120 Hypertensive chronic kidney disease with stage 5 chronic kidney disease or end stage renal disease: Secondary | ICD-10-CM | POA: Diagnosis not present

## 2023-10-02 DIAGNOSIS — R7881 Bacteremia: Secondary | ICD-10-CM | POA: Diagnosis not present

## 2023-10-02 DIAGNOSIS — N2581 Secondary hyperparathyroidism of renal origin: Secondary | ICD-10-CM | POA: Diagnosis not present

## 2023-10-02 DIAGNOSIS — N186 End stage renal disease: Secondary | ICD-10-CM | POA: Diagnosis not present

## 2023-10-02 DIAGNOSIS — D631 Anemia in chronic kidney disease: Secondary | ICD-10-CM | POA: Diagnosis not present

## 2023-10-02 DIAGNOSIS — B9689 Other specified bacterial agents as the cause of diseases classified elsewhere: Secondary | ICD-10-CM | POA: Diagnosis not present

## 2023-10-02 DIAGNOSIS — Z23 Encounter for immunization: Secondary | ICD-10-CM | POA: Diagnosis not present

## 2023-10-04 DIAGNOSIS — N2581 Secondary hyperparathyroidism of renal origin: Secondary | ICD-10-CM | POA: Diagnosis not present

## 2023-10-04 DIAGNOSIS — Z23 Encounter for immunization: Secondary | ICD-10-CM | POA: Diagnosis not present

## 2023-10-04 DIAGNOSIS — R7881 Bacteremia: Secondary | ICD-10-CM | POA: Diagnosis not present

## 2023-10-04 DIAGNOSIS — D631 Anemia in chronic kidney disease: Secondary | ICD-10-CM | POA: Diagnosis not present

## 2023-10-04 DIAGNOSIS — B9689 Other specified bacterial agents as the cause of diseases classified elsewhere: Secondary | ICD-10-CM | POA: Diagnosis not present

## 2023-10-04 DIAGNOSIS — N186 End stage renal disease: Secondary | ICD-10-CM | POA: Diagnosis not present

## 2023-10-07 DIAGNOSIS — D631 Anemia in chronic kidney disease: Secondary | ICD-10-CM | POA: Diagnosis not present

## 2023-10-07 DIAGNOSIS — Z23 Encounter for immunization: Secondary | ICD-10-CM | POA: Diagnosis not present

## 2023-10-07 DIAGNOSIS — B9689 Other specified bacterial agents as the cause of diseases classified elsewhere: Secondary | ICD-10-CM | POA: Diagnosis not present

## 2023-10-07 DIAGNOSIS — N2581 Secondary hyperparathyroidism of renal origin: Secondary | ICD-10-CM | POA: Diagnosis not present

## 2023-10-07 DIAGNOSIS — N186 End stage renal disease: Secondary | ICD-10-CM | POA: Diagnosis not present

## 2023-10-07 DIAGNOSIS — R7881 Bacteremia: Secondary | ICD-10-CM | POA: Diagnosis not present

## 2023-10-08 ENCOUNTER — Encounter (INDEPENDENT_AMBULATORY_CARE_PROVIDER_SITE_OTHER): Payer: Self-pay | Admitting: Ophthalmology

## 2023-10-08 ENCOUNTER — Ambulatory Visit (INDEPENDENT_AMBULATORY_CARE_PROVIDER_SITE_OTHER): Payer: Medicare Other | Admitting: Ophthalmology

## 2023-10-08 DIAGNOSIS — H25812 Combined forms of age-related cataract, left eye: Secondary | ICD-10-CM | POA: Diagnosis not present

## 2023-10-08 DIAGNOSIS — Z794 Long term (current) use of insulin: Secondary | ICD-10-CM | POA: Diagnosis not present

## 2023-10-08 DIAGNOSIS — H33103 Unspecified retinoschisis, bilateral: Secondary | ICD-10-CM

## 2023-10-08 DIAGNOSIS — H35033 Hypertensive retinopathy, bilateral: Secondary | ICD-10-CM

## 2023-10-08 DIAGNOSIS — Z961 Presence of intraocular lens: Secondary | ICD-10-CM

## 2023-10-08 DIAGNOSIS — I10 Essential (primary) hypertension: Secondary | ICD-10-CM

## 2023-10-08 DIAGNOSIS — E113513 Type 2 diabetes mellitus with proliferative diabetic retinopathy with macular edema, bilateral: Secondary | ICD-10-CM | POA: Diagnosis not present

## 2023-10-08 MED ORDER — BEVACIZUMAB CHEMO INJECTION 1.25MG/0.05ML SYRINGE FOR KALEIDOSCOPE
1.2500 mg | INTRAVITREAL | Status: AC | PRN
Start: 2023-10-08 — End: 2023-10-08
  Administered 2023-10-08: 1.25 mg via INTRAVITREAL

## 2023-10-08 NOTE — Progress Notes (Signed)
[facility] - Clinic Note  10/08/2023   CHIEF COMPLAINT Patient presents for Retina Follow Up  HISTORY OF PRESENT ILLNESS: Patricia [name] is a 52 y.o. female who presents to the clinic today for:  HPI     Retina Follow Up   Patient presents with  Diabetic Retinopathy.  In both eyes.  Severity is moderate.  Duration of 9 weeks.  Since onset it is stable.  I, the attending physician,  performed the HPI with the patient and updated documentation appropriately.        Comments   Pt here for 9 wk ret f/u PDR OU. Pt states she was in the hospital for 2 wks due to a fistula in her left arm, catheter fell out then got infected. They have created a new on on her right arm. Pt feels VA has not improved but doesn't think its worsened. She does see a 'red spot' in OD VA started last week. Says it resembles a floater but its red. A1C was 8.5 end of September.       Last edited by Rennis Chris, MD on 10/08/2023  1:03 PM.    Pt is delayed to follow up from 4 weeks to 9 weeks due to being in the hospital  Referring physician: [name], MD [phone] [place],  [place] [address]  HISTORICAL INFORMATION:  Selected notes from the MEDICAL RECORD NUMBER Referred by Dr. June Leap for macular edema LEE:  Ocular Hx- PMH-   CURRENT MEDICATIONS: Current Outpatient Medications (Ophthalmic Drugs)  Medication Sig   Polyvinyl Alcohol-Povidone (REFRESH OP) Place 1 drop into both eyes daily as needed (dry eyes).   No current facility-administered medications for this visit. (Ophthalmic Drugs)   Current Outpatient Medications (Other)  Medication Sig   acetaminophen (TYLENOL) 325 MG tablet Take 650 mg by mouth every 6 (six) hours as needed for mild pain or fever.   albuterol (VENTOLIN HFA) 108 (90 Base) MCG/ACT inhaler Inhale 2 puffs into the lungs every 6 (six) hours as needed for wheezing or shortness of breath.   ALPRAZolam (XANAX) 0.5 MG tablet Take 0.5 mg by mouth at  bedtime as needed for anxiety or sleep.   aspirin EC 81 MG tablet Take 1 tablet (81 mg total) by mouth daily. Swallow whole.   ceFAZolin (ANCEF) 2-4 GM/100ML-% IVPB Inject 100 mLs (2 g total) into the vein every Monday, Wednesday, and Friday with hemodialysis. Last dose on 10/30/23 on dialysis   cetirizine (ZYRTEC) 10 MG tablet Take 1 tablet (10 mg total) by mouth daily.   Cholecalciferol (VITAMIN D-3 PO) Take 1 tablet by mouth daily.   cinacalcet (SENSIPAR) 30 MG tablet Take 30 mg by mouth daily.   docusate sodium (COLACE) 100 MG capsule Take 100 mg by mouth daily.   ferric citrate (AURYXIA) 1 GM 210 MG(Fe) tablet Take 420 mg by mouth 2 (two) times daily with a meal. With Breakfast & with supper   glipiZIDE (GLUCOTROL) 5 MG tablet Take 1 tablet (5 mg total) by mouth daily before breakfast.   insulin degludec (TRESIBA FLEXTOUCH) 200 UNIT/ML FlexTouch Pen Inject 20 Units into the skin at bedtime.   ipratropium (ATROVENT) 0.06 % nasal spray Place 2 sprays into both nostrils daily as needed for rhinitis.   irbesartan (AVAPRO) 75 MG tablet Take 75 mg by mouth See admin instructions. Take 75 mg at night on non-dialysis days (Sunday, Tuesday, Thursday, Saturday)   labetalol (NORMODYNE) 100 MG tablet Take 100 mg  by mouth 2 (two) times daily.   linaclotide (LINZESS) 290 MCG CAPS capsule Take 290 mcg by mouth daily before breakfast.   montelukast (SINGULAIR) 10 MG tablet Take 1 tablet (10 mg total) by mouth at bedtime.   multivitamin (RENA-VIT) TABS tablet Take 1 tablet by mouth daily.   NIFEdipine (ADALAT CC) 30 MG 24 hr tablet Take 30 mg by mouth See admin instructions. Take 30 mg at night on non-dialysis days (Sunday, Tuesday, Thursday, Saturday)   ondansetron (ZOFRAN-ODT) 4 MG disintegrating tablet Take 1 tablet (4 mg total) by mouth every 8 (eight) hours as needed for nausea or vomiting.   oxyCODONE (OXY IR/ROXICODONE) 5 MG immediate release tablet Take 1 tablet (5 mg total) by mouth every 6 (six)  hours as needed for moderate pain.   OXYGEN Inhale 2 L into the lungs continuous.   pantoprazole (PROTONIX) 40 MG tablet TAKE 1 TABLET (40 MG TOTAL) BY MOUTH DAILY 30 MINUTES BEFORE BREAKFAST   rOPINIRole (REQUIP XL) 2 MG 24 hr tablet Take 2 mg by mouth at bedtime.    rosuvastatin (CRESTOR) 20 MG tablet Take 20 mg by mouth daily.   sevelamer carbonate (RENVELA) 800 MG tablet Take 1,600-2,400 mg by mouth See admin instructions. Take 1600 mg at breakfast, 2400 mg at lunch and 1600 mg at dinner   Current Facility-Administered Medications (Other)  Medication Route   0.9 %  sodium chloride infusion Intravenous   sodium chloride flush (NS) 0.9 % injection 3 mL Intravenous   REVIEW OF SYSTEMS: ROS   Positive for: Endocrine, Eyes, Respiratory Negative for: Constitutional, Gastrointestinal, Neurological, Skin, Genitourinary, Musculoskeletal, HENT, Cardiovascular, Psychiatric, Allergic/Imm, Heme/Lymph Last edited by Thompson Grayer, COT on 10/08/2023  8:27 AM.       ALLERGIES Allergies  Allergen Reactions   Norvasc [Amlodipine Besylate] Rash and Other (See Comments)    Dizziness    Reglan [Metoclopramide] Other (See Comments)    Hallucinations    PAST MEDICAL HISTORY Past Medical History:  Diagnosis Date   Anemia    Ankle fracture    Arthritis    Asthma    Blood transfusion without reported diagnosis    Breast cancer (HCC)    Cancer (HCC)    COVID    Diabetes mellitus without complication (HCC)    Dyspnea    End stage renal disease on dialysis (HCC)    M/W/F [facility] in [place]   GERD (gastroesophageal reflux disease)    Hypertension    OSA (obstructive sleep apnea)    uses CPAP sometimes   Pneumonia    PONV (postoperative nausea and vomiting)    Wears glasses    Past Surgical History:  Procedure Laterality Date   A/V FISTULAGRAM Left 08/13/2023   Procedure: A/V Fistulagram;  Surgeon: Nada Libman, MD;  Location: [facility]AS[facility];  Service: Cardiovascular;   Laterality: Left;   ABDOMINAL HYSTERECTOMY     APPLICATION OF WOUND VAC Left 05/26/2023   Procedure: APPLICATION OF WOUND VAC;  Surgeon: Victorino Sparrow, MD;  Location: [facility] OR;  Service: Vascular;  Laterality: Left;   AV FISTULA PLACEMENT  11/2014   at [facility] hospital   AV FISTULA REPAIR N/A 04/2021   BALLOON DILATION N/A 07/10/2016   Procedure: BALLOON DILATION;  Surgeon: West Bali, MD;  Location: [facility];  Service: Endoscopy;  Laterality: N/A;  Pyloric dilation   BREAST LUMPECTOMY     CATARACT EXTRACTION W/PHACO Right 05/24/2023   Procedure: CATARACT EXTRACTION PHACO AND INTRAOCULAR LENS PLACEMENT (  IOC);  Surgeon: [name], MD;  Location: [facility] ORS;  Service: Ophthalmology;  Laterality: Right;  CDE: 5.00   CESAREAN SECTION     CHOLECYSTECTOMY     COLONOSCOPY  02/2023   COLONOSCOPY WITH PROPOFOL N/A 09/27/2016   Dr. Jena Gauss: Internal hemorrhoids repeat colonoscopy in 10 years   COLONOSCOPY WITH PROPOFOL N/A 02/28/2023   Procedure: COLONOSCOPY WITH PROPOFOL;  Surgeon: Corbin Ade, MD;  Location: [facility];  Service: Endoscopy;  Laterality: N/A;  11:45 am, asa 3 dialysis pt   DILATION AND CURETTAGE OF UTERUS     ESOPHAGOGASTRODUODENOSCOPY N/A 07/10/2016   Dr.Fields- normal esophagus, gastric stenosis was found at the pylorus, gastritis on bx, normal examined duodenun   ESOPHAGOGASTRODUODENOSCOPY (EGD) WITH PROPOFOL N/A 11/21/2022   Procedure: ESOPHAGOGASTRODUODENOSCOPY (EGD) WITH PROPOFOL;  Surgeon: Dolores Frame, MD;  Location: [facility];  Service: Gastroenterology;  Laterality: N/A;   EXTERNAL FIXATION REMOVAL Right 10/29/2018   Procedure: REMOVAL RIGHT ANKLE BIOMET ZIMMER EXTERNAL FIXATOR, SHORT LEG CAST APPLICATION;  Surgeon: Eldred Manges, MD;  Location: [facility] OR;  Service: Orthopedics;  Laterality: Right;   INSERTION OF DIALYSIS CATHETER  05/26/2023   Procedure: INSERTION OF Left internal jugular DIALYSIS CATHETER;  Surgeon: Victorino Sparrow, MD;  Location:  [facility] OR;  Service: Vascular;;   IR FLUORO GUIDE CV LINE LEFT  08/28/2023   IR FLUORO GUIDE CV LINE RIGHT  09/24/2023   IR PATIENT EVAL TECH 0-60 MINS  09/24/2023   IR US GUIDE VASC ACCESS LEFT  08/28/2023   IR US GUIDE VASC ACCESS RIGHT  09/24/2023   MASTECTOMY     left sided   ORIF ANKLE FRACTURE Right 10/06/2018   Procedure: OPEN REDUCTION INTERNAL FIXATION (ORIF) RIGHT ANKLE TRIMALLEOLAR;  Surgeon: Eldred Manges, MD;  Location: [facility] OR;  Service: Orthopedics;  Laterality: Right;   PERIPHERAL VASCULAR BALLOON ANGIOPLASTY  08/13/2023   Procedure: PERIPHERAL VASCULAR BALLOON ANGIOPLASTY;  Surgeon: Nada Libman, MD;  Location: [facility];  Service: Cardiovascular;;   REVISON OF ARTERIOVENOUS FISTULA Left 05/26/2023   Procedure: REVISON OF ARTERIOVENOUS FISTULA LEFT ARM HEMATOMA WASH OUT;  Surgeon: Victorino Sparrow, MD;  Location: [facility] OR;  Service: Vascular;  Laterality: Left;   RIGHT/LEFT HEART CATH AND CORONARY ANGIOGRAPHY N/A 11/27/2022   Procedure: RIGHT/LEFT HEART CATH AND CORONARY ANGIOGRAPHY;  Surgeon: Swazil[place], Peter M, MD;  Location: [facility] INVASIVE CV LAB;  Service: Cardiovascular;  Laterality: N/A;   TEE WITHOUT CARDIOVERSION N/A 09/18/2023   Procedure: TRANSESOPHAGEAL ECHOCARDIOGRAM (TEE);  Surgeon: Jonelle Sidle, MD;  Location: [facility];  Service: Cardiovascular;  Laterality: N/A;   FAMILY HISTORY Family History  Problem Relation Age of Onset   Diabetes Mellitus II Mother    Hypertension Mother    Heart block Mother    Hypertension Sister    Hypertension Sister    Colon cancer Neg Hx    SOCIAL HISTORY Social History   Tobacco Use   Smoking status: Never   Smokeless tobacco: Never  Vaping Use   Vaping status: Never Used  Substance Use Topics   Alcohol use: No    Alcohol/week: 0.0 standard drinks of alcohol   Drug use: No       OPHTHALMIC EXAM:  Base Eye Exam     Visual Acuity (Snellen - Linear)       Right Left   Dist [place] 20/70 -2 20/40 -1   Dist ph [place] NI 20/40  +1  Tonometry (Tonopen, 8:37 AM)       Right Left   Pressure 24 12  Used brim and cosopt OD MS        Pupils       Pupils Dark Light Shape React APD   Right PERRL 3 2 Round Brisk None   Left PERRL 3 2 Round Brisk None         Visual Fields (Counting fingers)       Left Right    Full Full         Extraocular Movement       Right Left    Full, Ortho Full, Ortho         Neuro/Psych     Oriented x3: Yes   Mood/Affect: Normal         Dilation     Both eyes: 1.0% Mydriacyl, 2.5% Phenylephrine @ 8:39 AM           Slit Lamp and Fundus Exam     Slit Lamp Exam       Right Left   Lids/Lashes Dermatochalasis - upper lid Dermatochalasis - upper lid   Conjunctiva/Sclera mild melanosis mild melanosis   Cornea mild arcus, well healed cataract wound mild arcus, 1+ Punctate epithelial erosions   Anterior Chamber deep and clear deep and clear   Iris Round and reactive, No NVI Round and dilated, No NVI   Lens PC IOL in good position 2-3+ Nuclear sclerosis, 2-3+ Cortical cataract   Anterior Vitreous mild syneresis, old white VH settled inferiorly, scattered vitreous condensations, Posterior vitreous detachment mild syneresis, scattered fibrosis         Fundus Exam       Right Left   Disc Pink and Sharp, no NVD mild Pallor, Sharp rim, no NVD   C/D Ratio 0.2 0.3   Macula Blunted foveal reflex, scattered MA, focal pigment clump temporal fovea Flat, Blunted foveal reflex, central edema and exudate, scattered MA   Vessels attenuated, Tortuous attenuated, mild tortuosity, mild copper wiring   Periphery Attached, scattered PRP 360 with good fill in changes, scattered MA/DBH, peripheral schisis IT with old, white VH overlying, red boat-shaped subhyaloid heme inferiorly -- almost resolved Attached, 360 peripheral PRP with good fill in changes, scattered patches of pre-retinal heme turning white           IMAGING AND PROCEDURES  Imaging and Procedures  for 10/08/2023  OCT, Retina - OU - Both Eyes       Right Eye Quality was good. Central Foveal Thickness: 265. Progression has improved. Findings include normal foveal contour, no SRF, intraretinal hyper-reflective material, intraretinal fluid (Focal IRHM and trace cystic changes temporal fovea; IT schisis - stable, +vitreous opacities -- slightly improved, partial PVD).   Left Eye Quality was good. Central Foveal Thickness: 425. Progression has been stable. Findings include no SRF, abnormal foveal contour, intraretinal hyper-reflective material, intraretinal fluid, vitreomacular adhesion (Persistent IRF/IRHM/edema temporal fovea and macula; IN schisis caught on widefield -- not imaged today).   Notes *Images captured and stored on drive  Diagnosis / Impression:  +DME OU + peripheral retinoschisis OU OD: Focal IRHM and trace cystic changes temporal fovea; IT schisis - stable, +vitreous opacities -- slightly improved, partial PVD OS: Persistent IRF/IRHM/edema temporal fovea and macula; IN schisis caught on widefield -- not imaged today  Clinical management:  See below  Abbreviations: NFP - Normal foveal profile. CME - cystoid macular edema. PED - pigment epithelial detachment. IRF - intraretinal fluid. SRF - subretinal  fluid. EZ - ellipsoid zone. ERM - epiretinal membrane. ORA - outer retinal atrophy. ORT - outer retinal tubulation. SRHM - subretinal hyper-reflective material. IRHM - intraretinal hyper-reflective material      Intravitreal Injection, Pharmacologic Agent - OD - Right Eye       Time Out 10/08/2023. 9:27 AM. Confirmed correct patient, procedure, site, and patient consented.   Anesthesia Topical anesthesia was used. Anesthetic medications included Lidocaine 4%, Proparacaine 0.5%.   Procedure Preparation included 5% betadine to ocular surface, eyelid speculum. A supplied (32g) needle was used.   Injection: 1.25 mg Bevacizumab 1.25mg /0.51ml   Route: Intravitreal,  Site: Right Eye   NDC: P3213405, Lot: [id number], Expiration date: 11/23/2023   Post-op Post injection exam found visual acuity of at least counting fingers. The patient tolerated the procedure well. There were no complications. The patient received written and verbal post procedure care education. Post injection medications were not given.      Intravitreal Injection, Pharmacologic Agent - OS - Left Eye       Time Out 10/08/2023. 9:28 AM. Confirmed correct patient, procedure, site, and patient consented.   Anesthesia Topical anesthesia was used. Anesthetic medications included Lidocaine 2%, Proparacaine 0.5%.   Procedure Preparation included 5% betadine to ocular surface, eyelid speculum. A supplied (32g) needle was used.   Injection: 1.25 mg Bevacizumab 1.25mg /0.42ml   Route: Intravitreal, Site: Left Eye   NDC: P3213405, Lot: [id number], Expiration date: 11/25/2023   Post-op Post injection exam found visual acuity of at least counting fingers. The patient tolerated the procedure well. There were no complications. The patient received written and verbal post procedure care education.           ASSESSMENT/PLAN:   ICD-10-CM   1. Proliferative diabetic retinopathy of both eyes with macular edema associated with type 2 diabetes mellitus (HCC)  E11.3513 OCT, Retina - OU - Both Eyes    Intravitreal Injection, Pharmacologic Agent - OD - Right Eye    Intravitreal Injection, Pharmacologic Agent - OS - Left Eye    Bevacizumab (AVASTIN) SOLN 1.25 mg    Bevacizumab (AVASTIN) SOLN 1.25 mg    2. 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 Essential hypertension  I10     5. Hypertensive retinopathy of both eyes  H35.033     6. Pseudophakia  Z96.1     7. Combined forms of age-related cataract of left eye  H25.812      1,2. Proliferative diabetic retinopathy, both eyes  - delayed f/u from 4 weeks to 9 weeks (08.20.24-10.22.24) due to being  in the hospital  - s/p IVA OS #1 (05.14.24), IVA OD #1 (05.16.24)  - s/p IVA OU # 2 (06.25.24), #3 (07.23.24), #4 (08.20.24)  - s/p PRP OS (05.21.24)  - s/p PRP OD (08.01.24)  - previously followed with Dr. Dorette Grate at [facility], but had been lost to f/u since 10.28.2020  - h/o PRP OU; had never received anti-VEGF injxns prior to here, per report - exam shows old white VH + some red subhyaloid heme OD; OS with focal clusters of preretinal heme turning white -- nasal and temporal periphery - FA (05.14.24) shows OD: Clusters of perivascular leakage inferior and temporal midzone -- ?early NV; OS: Clusters of NVE temporal midzone -- pt would benefit from fill in PRP OU (OS first--done 05.21.24) - OCT shows OD: Focal IRHM and trace cystic changes temporal fovea; IT schisis - stable, +vitreous opacities --  slightly improved, partial PVD; OS: Persistent IRF/IRHM/edema temporal fovea and macula; IN schisis caught on widefield -- not imaged today at 9 wks - recommend IVA #5 OU today 10.22.24 w/ f/u in 4 wks - pt wishes to proceed with injections - RBA of procedure discussed, questions answered - Avastin informed consent obtained and signed 05.14.24 (OU) - see procedure note - f/u 4 weeks, DFE, OCT, possible injection(s)  3. Retinoschisis OU  - peripheral retinoschisis confirmed by OCT  - OD -- inferotemporal periphery  - OS -- nasal periphery  - stable, monitor  4,5. Hypertensive retinopathy OU - discussed importance of tight BP control - monitor  6. Pseudophakia OD  - s/p CE/IOL OD (Dr. June Leap, 06.07.24)  - IOL in good position, doing well  - monitor  7. Mixed Cataract OS - The symptoms of cataract, surgical options, and treatments and risks were discussed with patient. - discussed diagnosis and progression - under the expert management of Dr. June Leap - clear from a retina standpoint to proceed with cataract surgery when pt and surgeon are ready  Ophthalmic Meds Ordered this visit:  Meds  ordered this encounter  Medications   Bevacizumab (AVASTIN) SOLN 1.25 mg   Bevacizumab (AVASTIN) SOLN 1.25 mg     Return in about 4 weeks (around 11/05/2023) for f/u PDR OU, DFE, OCT.  There are no Patient Instructions on file for this visit.  Explained the diagnoses, plan, and follow up with the patient and they expressed understanding.  Patient expressed understanding of the importance of proper follow up care.   This document serves as a record of services personally performed by Karie Chimera, MD, PhD. It was created on their behalf by Glee Arvin. Manson Passey, OA an ophthalmic technician. The creation of this record is the provider's dictation and/or activities during the visit.    Electronically signed by: Glee Arvin. Manson Passey, OA 10/09/23 8:28 PM  Karie Chimera, M.D., Ph.D. Diseases & Surgery of the Retina and Vitreous [facility]  I have reviewed the above documentation for accuracy and completeness, and I agree with the above. Karie Chimera, M.D., Ph.D. 10/09/23 8:30 PM   Abbreviations: M myopia (nearsighted); A astigmatism; H hyperopia (farsighted); P presbyopia; Mrx spectacle prescription;  CTL contact lenses; OD right eye; OS left eye; OU both eyes  XT exotropia; ET esotropia; PEK punctate epithelial keratitis; PEE punctate epithelial erosions; DES dry eye syndrome; MGD meibomian gland dysfunction; ATs artificial tears; PFAT's preservative free artificial tears; NSC nuclear sclerotic cataract; PSC posterior subcapsular cataract; ERM epi-retinal membrane; PVD posterior vitreous detachment; RD retinal detachment; DM diabetes mellitus; DR diabetic retinopathy; NPDR non-proliferative diabetic retinopathy; PDR proliferative diabetic retinopathy; CSME clinically significant macular edema; DME diabetic macular edema; dbh dot blot hemorrhages; CWS cotton wool spot; POAG primary open angle glaucoma; C/D cup-to-disc ratio; HVF humphrey visual field; GVF goldmann visual field;  OCT optical coherence tomography; IOP intraocular pressure; BRVO Branch retinal vein occlusion; CRVO central retinal vein occlusion; CRAO central retinal artery occlusion; BRAO branch retinal artery occlusion; RT retinal tear; SB scleral buckle; PPV pars plana vitrectomy; VH Vitreous hemorrhage; PRP panretinal laser photocoagulation; IVK intravitreal kenalog; VMT vitreomacular traction; MH Macular hole;  NVD neovascularization of the disc; NVE neovascularization elsewhere; AREDS age related eye disease study; ARMD age related macular degeneration; POAG primary open angle glaucoma; EBMD epithelial/anterior basement membrane dystrophy; ACIOL anterior chamber intraocular lens; IOL intraocular lens; PCIOL posterior chamber intraocular lens; Phaco/IOL phacoemulsification with intraocular lens placement; PRK photorefractive keratectomy;  LASIK laser assisted in situ keratomileusis; HTN hypertension; DM diabetes mellitus; COPD chronic obstructive pulmonary disease

## 2023-10-09 DIAGNOSIS — N186 End stage renal disease: Secondary | ICD-10-CM | POA: Diagnosis not present

## 2023-10-09 DIAGNOSIS — R7881 Bacteremia: Secondary | ICD-10-CM | POA: Diagnosis not present

## 2023-10-09 DIAGNOSIS — D631 Anemia in chronic kidney disease: Secondary | ICD-10-CM | POA: Diagnosis not present

## 2023-10-09 DIAGNOSIS — B9689 Other specified bacterial agents as the cause of diseases classified elsewhere: Secondary | ICD-10-CM | POA: Diagnosis not present

## 2023-10-09 DIAGNOSIS — Z23 Encounter for immunization: Secondary | ICD-10-CM | POA: Diagnosis not present

## 2023-10-09 DIAGNOSIS — N2581 Secondary hyperparathyroidism of renal origin: Secondary | ICD-10-CM | POA: Diagnosis not present

## 2023-10-10 DIAGNOSIS — B3731 Acute candidiasis of vulva and vagina: Secondary | ICD-10-CM | POA: Diagnosis not present

## 2023-10-10 DIAGNOSIS — R21 Rash and other nonspecific skin eruption: Secondary | ICD-10-CM | POA: Diagnosis not present

## 2023-10-10 DIAGNOSIS — A4101 Sepsis due to Methicillin susceptible Staphylococcus aureus: Secondary | ICD-10-CM | POA: Diagnosis not present

## 2023-10-10 DIAGNOSIS — M25561 Pain in right knee: Secondary | ICD-10-CM | POA: Diagnosis not present

## 2023-10-11 DIAGNOSIS — N2581 Secondary hyperparathyroidism of renal origin: Secondary | ICD-10-CM | POA: Diagnosis not present

## 2023-10-11 DIAGNOSIS — Z23 Encounter for immunization: Secondary | ICD-10-CM | POA: Diagnosis not present

## 2023-10-11 DIAGNOSIS — B9689 Other specified bacterial agents as the cause of diseases classified elsewhere: Secondary | ICD-10-CM | POA: Diagnosis not present

## 2023-10-11 DIAGNOSIS — D631 Anemia in chronic kidney disease: Secondary | ICD-10-CM | POA: Diagnosis not present

## 2023-10-11 DIAGNOSIS — N186 End stage renal disease: Secondary | ICD-10-CM | POA: Diagnosis not present

## 2023-10-11 DIAGNOSIS — R7881 Bacteremia: Secondary | ICD-10-CM | POA: Diagnosis not present

## 2023-10-14 DIAGNOSIS — B9689 Other specified bacterial agents as the cause of diseases classified elsewhere: Secondary | ICD-10-CM | POA: Diagnosis not present

## 2023-10-14 DIAGNOSIS — M25561 Pain in right knee: Secondary | ICD-10-CM | POA: Diagnosis not present

## 2023-10-14 DIAGNOSIS — M25661 Stiffness of right knee, not elsewhere classified: Secondary | ICD-10-CM | POA: Diagnosis not present

## 2023-10-14 DIAGNOSIS — R7881 Bacteremia: Secondary | ICD-10-CM | POA: Diagnosis not present

## 2023-10-14 DIAGNOSIS — N186 End stage renal disease: Secondary | ICD-10-CM | POA: Diagnosis not present

## 2023-10-14 DIAGNOSIS — Z23 Encounter for immunization: Secondary | ICD-10-CM | POA: Diagnosis not present

## 2023-10-14 DIAGNOSIS — N2581 Secondary hyperparathyroidism of renal origin: Secondary | ICD-10-CM | POA: Diagnosis not present

## 2023-10-14 DIAGNOSIS — M62561 Muscle wasting and atrophy, not elsewhere classified, right lower leg: Secondary | ICD-10-CM | POA: Diagnosis not present

## 2023-10-14 DIAGNOSIS — D631 Anemia in chronic kidney disease: Secondary | ICD-10-CM | POA: Diagnosis not present

## 2023-10-14 DIAGNOSIS — R2689 Other abnormalities of gait and mobility: Secondary | ICD-10-CM | POA: Diagnosis not present

## 2023-10-16 DIAGNOSIS — Z23 Encounter for immunization: Secondary | ICD-10-CM | POA: Diagnosis not present

## 2023-10-16 DIAGNOSIS — B9689 Other specified bacterial agents as the cause of diseases classified elsewhere: Secondary | ICD-10-CM | POA: Diagnosis not present

## 2023-10-16 DIAGNOSIS — N186 End stage renal disease: Secondary | ICD-10-CM | POA: Diagnosis not present

## 2023-10-16 DIAGNOSIS — D631 Anemia in chronic kidney disease: Secondary | ICD-10-CM | POA: Diagnosis not present

## 2023-10-16 DIAGNOSIS — R7881 Bacteremia: Secondary | ICD-10-CM | POA: Diagnosis not present

## 2023-10-16 DIAGNOSIS — N2581 Secondary hyperparathyroidism of renal origin: Secondary | ICD-10-CM | POA: Diagnosis not present

## 2023-10-17 DIAGNOSIS — N186 End stage renal disease: Secondary | ICD-10-CM | POA: Diagnosis not present

## 2023-10-17 DIAGNOSIS — Z992 Dependence on renal dialysis: Secondary | ICD-10-CM | POA: Diagnosis not present

## 2023-10-18 DIAGNOSIS — N186 End stage renal disease: Secondary | ICD-10-CM | POA: Diagnosis not present

## 2023-10-18 DIAGNOSIS — N250 Renal osteodystrophy: Secondary | ICD-10-CM | POA: Diagnosis not present

## 2023-10-18 DIAGNOSIS — R7881 Bacteremia: Secondary | ICD-10-CM | POA: Diagnosis not present

## 2023-10-18 DIAGNOSIS — B9689 Other specified bacterial agents as the cause of diseases classified elsewhere: Secondary | ICD-10-CM | POA: Diagnosis not present

## 2023-10-18 DIAGNOSIS — N2581 Secondary hyperparathyroidism of renal origin: Secondary | ICD-10-CM | POA: Diagnosis not present

## 2023-10-18 DIAGNOSIS — Z992 Dependence on renal dialysis: Secondary | ICD-10-CM | POA: Diagnosis not present

## 2023-10-18 DIAGNOSIS — D509 Iron deficiency anemia, unspecified: Secondary | ICD-10-CM | POA: Diagnosis not present

## 2023-10-18 DIAGNOSIS — D631 Anemia in chronic kidney disease: Secondary | ICD-10-CM | POA: Diagnosis not present

## 2023-10-21 ENCOUNTER — Other Ambulatory Visit: Payer: Self-pay | Admitting: "Endocrinology

## 2023-10-21 DIAGNOSIS — E1122 Type 2 diabetes mellitus with diabetic chronic kidney disease: Secondary | ICD-10-CM

## 2023-10-21 DIAGNOSIS — D631 Anemia in chronic kidney disease: Secondary | ICD-10-CM | POA: Diagnosis not present

## 2023-10-21 DIAGNOSIS — B9689 Other specified bacterial agents as the cause of diseases classified elsewhere: Secondary | ICD-10-CM | POA: Diagnosis not present

## 2023-10-21 DIAGNOSIS — N2581 Secondary hyperparathyroidism of renal origin: Secondary | ICD-10-CM | POA: Diagnosis not present

## 2023-10-21 DIAGNOSIS — R7881 Bacteremia: Secondary | ICD-10-CM | POA: Diagnosis not present

## 2023-10-21 DIAGNOSIS — N186 End stage renal disease: Secondary | ICD-10-CM | POA: Diagnosis not present

## 2023-10-21 DIAGNOSIS — Z992 Dependence on renal dialysis: Secondary | ICD-10-CM | POA: Diagnosis not present

## 2023-10-22 DIAGNOSIS — M25661 Stiffness of right knee, not elsewhere classified: Secondary | ICD-10-CM | POA: Diagnosis not present

## 2023-10-22 DIAGNOSIS — M62561 Muscle wasting and atrophy, not elsewhere classified, right lower leg: Secondary | ICD-10-CM | POA: Diagnosis not present

## 2023-10-22 DIAGNOSIS — M25561 Pain in right knee: Secondary | ICD-10-CM | POA: Diagnosis not present

## 2023-10-22 DIAGNOSIS — R2689 Other abnormalities of gait and mobility: Secondary | ICD-10-CM | POA: Diagnosis not present

## 2023-10-22 NOTE — Progress Notes (Signed)
[facility] - Clinic Note  11/05/2023   CHIEF COMPLAINT Patient presents for Retina Follow Up  HISTORY OF PRESENT ILLNESS: Patricia [name] is a 52 y.o. female who presents to the clinic today for:  HPI     Retina Follow Up   Patient presents with  Diabetic Retinopathy.  In both eyes.  This started 4 weeks ago.  Duration of 4 weeks.  Since onset it is stable.  I, the attending physician,  performed the HPI with the patient and updated documentation appropriately.        Comments   4 week retina follow up IVA OU pt is reporting no vision changes noticed she denies any flashes or floaters she is reporting high blood sugar with the last one yesterday 130      Last edited by Rennis Chris, MD on 11/05/2023  8:59 AM.    Pt states she still has 2 days of IV antibiotics for sepsis and is also taking an oral antibiotic, she feels like vision in the right eye is better, but also feels like there is a film over it   Referring physician: [name], MD [phone] [place],  [place] [address]  HISTORICAL INFORMATION:  Selected notes from the MEDICAL RECORD NUMBER Referred by Dr. June Leap for macular edema LEE:  Ocular Hx- PMH-   CURRENT MEDICATIONS: Current Outpatient Medications (Ophthalmic Drugs)  Medication Sig   Polyvinyl Alcohol-Povidone (REFRESH OP) Place 1 drop into both eyes daily as needed (dry eyes).   No current facility-administered medications for this visit. (Ophthalmic Drugs)   Current Outpatient Medications (Other)  Medication Sig   acetaminophen (TYLENOL) 325 MG tablet Take 650 mg by mouth every 6 (six) hours as needed for mild pain or fever.   albuterol (VENTOLIN HFA) 108 (90 Base) MCG/ACT inhaler Inhale 2 puffs into the lungs every 6 (six) hours as needed for wheezing or shortness of breath.   ALPRAZolam (XANAX) 0.5 MG tablet Take 0.5 mg by mouth at bedtime as needed for anxiety or sleep.   aspirin EC 81 MG tablet Take 1 tablet (81 mg  total) by mouth daily. Swallow whole.   ceFAZolin (ANCEF) 2-4 GM/100ML-% IVPB Inject 100 mLs (2 g total) into the vein every Monday, Wednesday, and Friday with hemodialysis. Last dose on 10/30/23 on dialysis   cetirizine (ZYRTEC) 10 MG tablet Take 1 tablet (10 mg total) by mouth daily.   Cholecalciferol (VITAMIN D-3 PO) Take 1 tablet by mouth daily.   cinacalcet (SENSIPAR) 30 MG tablet Take 30 mg by mouth daily.   docusate sodium (COLACE) 100 MG capsule Take 100 mg by mouth daily.   ferric citrate (AURYXIA) 1 GM 210 MG(Fe) tablet Take 420 mg by mouth 2 (two) times daily with a meal. With Breakfast & with supper   glipiZIDE (GLUCOTROL) 5 MG tablet Take 1 tablet (5 mg total) by mouth daily before breakfast.   ipratropium (ATROVENT) 0.06 % nasal spray Place 2 sprays into both nostrils daily as needed for rhinitis.   irbesartan (AVAPRO) 75 MG tablet Take 75 mg by mouth See admin instructions. Take 75 mg at night on non-dialysis days (Sunday, Tuesday, Thursday, Saturday)   labetalol (NORMODYNE) 100 MG tablet Take 100 mg by mouth 2 (two) times daily.   linaclotide (LINZESS) 290 MCG CAPS capsule Take 290 mcg by mouth daily before breakfast.   montelukast (SINGULAIR) 10 MG tablet Take 1 tablet (10 mg total) by mouth at bedtime.   multivitamin (RENA-VIT)  TABS tablet Take 1 tablet by mouth daily.   NIFEdipine (ADALAT CC) 30 MG 24 hr tablet Take 30 mg by mouth See admin instructions. Take 30 mg at night on non-dialysis days (Sunday, Tuesday, Thursday, Saturday)   ondansetron (ZOFRAN-ODT) 4 MG disintegrating tablet Take 1 tablet (4 mg total) by mouth every 8 (eight) hours as needed for nausea or vomiting.   oxyCODONE (OXY IR/ROXICODONE) 5 MG immediate release tablet Take 1 tablet (5 mg total) by mouth every 6 (six) hours as needed for moderate pain.   OXYGEN Inhale 2 L into the lungs continuous.   pantoprazole (PROTONIX) 40 MG tablet TAKE 1 TABLET (40 MG TOTAL) BY MOUTH DAILY 30 MINUTES BEFORE BREAKFAST    rOPINIRole (REQUIP XL) 2 MG 24 hr tablet Take 2 mg by mouth at bedtime.    rosuvastatin (CRESTOR) 20 MG tablet Take 20 mg by mouth daily.   sevelamer carbonate (RENVELA) 800 MG tablet Take 1,600-2,400 mg by mouth See admin instructions. Take 1600 mg at breakfast, 2400 mg at lunch and 1600 mg at dinner   TRESIBA FLEXTOUCH 200 UNIT/ML FlexTouch Pen ADMINISTER 20 UNITS UNDER THE SKIN AT BEDTIME   Current Facility-Administered Medications (Other)  Medication Route   0.9 %  sodium chloride infusion Intravenous   sodium chloride flush (NS) 0.9 % injection 3 mL Intravenous   REVIEW OF SYSTEMS: ROS   Positive for: Endocrine, Eyes, Respiratory Negative for: Constitutional, Gastrointestinal, Neurological, Skin, Genitourinary, Musculoskeletal, HENT, Cardiovascular, Psychiatric, Allergic/Imm, Heme/Lymph Last edited by Etheleen Mayhew, COT on 11/05/2023  7:46 AM.     ALLERGIES Allergies  Allergen Reactions   Norvasc [Amlodipine Besylate] Rash and Other (See Comments)    Dizziness    Reglan [Metoclopramide] Other (See Comments)    Hallucinations    PAST MEDICAL HISTORY Past Medical History:  Diagnosis Date   Anemia    Ankle fracture    Arthritis    Asthma    Blood transfusion without reported diagnosis    Breast cancer (HCC)    Cancer (HCC)    COVID    Diabetes mellitus without complication (HCC)    Dyspnea    End stage renal disease on dialysis (HCC)    M/W/F [facility] in [place]   GERD (gastroesophageal reflux disease)    Hypertension    OSA (obstructive sleep apnea)    uses CPAP sometimes   Pneumonia    PONV (postoperative nausea and vomiting)    Wears glasses    Past Surgical History:  Procedure Laterality Date   A/V FISTULAGRAM Left 08/13/2023   Procedure: A/V Fistulagram;  Surgeon: Nada Libman, MD;  Location: [facility];  Service: Cardiovascular;  Laterality: Left;   ABDOMINAL HYSTERECTOMY     APPLICATION OF WOUND VAC Left 05/26/2023   Procedure:  APPLICATION OF WOUND VAC;  Surgeon: Victorino Sparrow, MD;  Location: [facility] OR;  Service: Vascular;  Laterality: Left;   AV FISTULA PLACEMENT  11/2014   at [facility]   AV FISTULA REPAIR N/A 04/2021   BALLOON DILATION N/A 07/10/2016   Procedure: BALLOON DILATION;  Surgeon: West Bali, MD;  Location: [facility];  Service: Endoscopy;  Laterality: N/A;  Pyloric dilation   BREAST LUMPECTOMY     CATARACT EXTRACTION W/PHACO Right 05/24/2023   Procedure: CATARACT EXTRACTION PHACO AND INTRAOCULAR LENS PLACEMENT (IOC);  Surgeon: [name], MD;  Location: [facility] ORS;  Service: Ophthalmology;  Laterality: Right;  CDE: 5.00   CESAREAN SECTION     CHOLECYSTECTOMY  COLONOSCOPY  02/2023   COLONOSCOPY WITH PROPOFOL N/A 09/27/2016   Dr. Jena Gauss: Internal hemorrhoids repeat colonoscopy in 10 years   COLONOSCOPY WITH PROPOFOL N/A 02/28/2023   Procedure: COLONOSCOPY WITH PROPOFOL;  Surgeon: Corbin Ade, MD;  Location: [facility];  Service: Endoscopy;  Laterality: N/A;  11:45 am, asa 3 dialysis pt   DILATION AND CURETTAGE OF UTERUS     ESOPHAGOGASTRODUODENOSCOPY N/A 07/10/2016   Dr.Fields- normal esophagus, gastric stenosis was found at the pylorus, gastritis on bx, normal examined duodenun   ESOPHAGOGASTRODUODENOSCOPY (EGD) WITH PROPOFOL N/A 11/21/2022   Procedure: ESOPHAGOGASTRODUODENOSCOPY (EGD) WITH PROPOFOL;  Surgeon: Dolores Frame, MD;  Location: [facility];  Service: Gastroenterology;  Laterality: N/A;   EXTERNAL FIXATION REMOVAL Right 10/29/2018   Procedure: REMOVAL RIGHT ANKLE BIOMET ZIMMER EXTERNAL FIXATOR, SHORT LEG CAST APPLICATION;  Surgeon: Eldred Manges, MD;  Location: [facility] OR;  Service: Orthopedics;  Laterality: Right;   INSERTION OF DIALYSIS CATHETER  05/26/2023   Procedure: INSERTION OF Left internal jugular DIALYSIS CATHETER;  Surgeon: Victorino Sparrow, MD;  Location: [facility] OR;  Service: Vascular;;   IR FLUORO GUIDE CV LINE LEFT  08/28/2023   IR FLUORO GUIDE CV LINE RIGHT   09/24/2023   IR PATIENT EVAL TECH 0-60 MINS  09/24/2023   IR US GUIDE VASC ACCESS LEFT  08/28/2023   IR US GUIDE VASC ACCESS RIGHT  09/24/2023   MASTECTOMY     left sided   ORIF ANKLE FRACTURE Right 10/06/2018   Procedure: OPEN REDUCTION INTERNAL FIXATION (ORIF) RIGHT ANKLE TRIMALLEOLAR;  Surgeon: Eldred Manges, MD;  Location: [facility] OR;  Service: Orthopedics;  Laterality: Right;   PERIPHERAL VASCULAR BALLOON ANGIOPLASTY  08/13/2023   Procedure: PERIPHERAL VASCULAR BALLOON ANGIOPLASTY;  Surgeon: Nada Libman, MD;  Location: [facility];  Service: Cardiovascular;;   REVISON OF ARTERIOVENOUS FISTULA Left 05/26/2023   Procedure: REVISON OF ARTERIOVENOUS FISTULA LEFT ARM HEMATOMA WASH OUT;  Surgeon: Victorino Sparrow, MD;  Location: [facility] OR;  Service: Vascular;  Laterality: Left;   RIGHT/LEFT HEART CATH AND CORONARY ANGIOGRAPHY N/A 11/27/2022   Procedure: RIGHT/LEFT HEART CATH AND CORONARY ANGIOGRAPHY;  Surgeon: Swazil[place], Peter M, MD;  Location: [facility] INVASIVE CV LAB;  Service: Cardiovascular;  Laterality: N/A;   TEE WITHOUT CARDIOVERSION N/A 09/18/2023   Procedure: TRANSESOPHAGEAL ECHOCARDIOGRAM (TEE);  Surgeon: Jonelle Sidle, MD;  Location: [facility];  Service: Cardiovascular;  Laterality: N/A;   FAMILY HISTORY Family History  Problem Relation Age of Onset   Diabetes Mellitus II Mother    Hypertension Mother    Heart block Mother    Hypertension Sister    Hypertension Sister    Colon cancer Neg Hx    SOCIAL HISTORY Social History   Tobacco Use   Smoking status: Never   Smokeless tobacco: Never  Vaping Use   Vaping status: Never Used  Substance Use Topics   Alcohol use: No    Alcohol/week: 0.0 standard drinks of alcohol   Drug use: No       OPHTHALMIC EXAM:  Base Eye Exam     Visual Acuity (Snellen - Linear)       Right Left   Dist [place] 20/60 -2 20/60 -3   Dist ph [place] NI NI         Tonometry (Tonopen, 7:56 AM)       Right Left   Pressure 19 20         Pupils  Pupils Dark Light Shape React APD   Right PERRL 3 2 Round Brisk None   Left PERRL 3 2 Round Brisk None         Visual Fields       Left Right    Full Full         Extraocular Movement       Right Left    Full, Ortho Full, Ortho         Neuro/Psych     Oriented x3: Yes   Mood/Affect: Normal         Dilation     Both eyes: 2.5% Phenylephrine @ 7:56 AM           Slit Lamp and Fundus Exam     Slit Lamp Exam       Right Left   Lids/Lashes Dermatochalasis - upper lid Dermatochalasis - upper lid   Conjunctiva/Sclera mild melanosis mild melanosis   Cornea mild arcus, well healed cataract wound mild arcus, 1+ Punctate epithelial erosions   Anterior Chamber deep and clear deep and clear   Iris Round and reactive, No NVI Round and dilated, No NVI   Lens PC IOL in good position 2-3+ Nuclear sclerosis, 2-3+ Cortical cataract   Anterior Vitreous mild syneresis, old white VH settled inferiorly, scattered vitreous condensations, Posterior vitreous detachment mild syneresis, scattered fibrosis         Fundus Exam       Right Left   Disc Pink and Sharp, no NVD mild Pallor, Sharp rim, no NVD   C/D Ratio 0.2 0.3   Macula Blunted foveal reflex, scattered MA, focal pigment clump temporal fovea, trace cystic changes improved Flat, Blunted foveal reflex, central edema and exudates, scattered MA   Vessels attenuated, Tortuous attenuated, mild tortuosity   Periphery Attached, scattered PRP 360 with good fill in changes, scattered MA/DBH, peripheral schisis IT with old, white VH overlying, red boat-shaped subhyaloid heme inferiorly -- resolved Attached, 360 peripheral PRP with good fill in changes, scattered patches of pre-retinal heme turning white           IMAGING AND PROCEDURES  Imaging and Procedures for 11/05/2023  OCT, Retina - OU - Both Eyes       Right Eye Quality was good. Central Foveal Thickness: 258. Progression has improved. Findings include normal  foveal contour, no SRF, intraretinal hyper-reflective material, intraretinal fluid (Focal IRHM and trace cystic changes temporal fovea and macula -- improved; IT schisis - stable, +vitreous opacities -- slightly improved, partial PVD).   Left Eye Quality was good. Central Foveal Thickness: 404. Progression has improved. Findings include no SRF, abnormal foveal contour, intraretinal hyper-reflective material, intraretinal fluid, vitreomacular adhesion (Mild interval improvement in IRF/IRHM/edema temporal fovea and macula; IN schisis caught on widefield -- not imaged today).   Notes *Images captured and stored on drive  Diagnosis / Impression:  +DME OU + peripheral retinoschisis OU OD: Focal IRHM and trace cystic changes temporal fovea and macula -- improved; IT schisis - stable, +vitreous opacities -- slightly improved, partial PVD OS: Mild interval improvement in IRF/IRHM/edema temporal fovea and macula; IN schisis caught on widefield -- not imaged today  Clinical management:  See below  Abbreviations: NFP - Normal foveal profile. CME - cystoid macular edema. PED - pigment epithelial detachment. IRF - intraretinal fluid. SRF - subretinal fluid. EZ - ellipsoid zone. ERM - epiretinal membrane. ORA - outer retinal atrophy. ORT - outer retinal tubulation. SRHM - subretinal hyper-reflective material. IRHM - intraretinal hyper-reflective material  Intravitreal Injection, Pharmacologic Agent - OD - Right Eye       Time Out 11/05/2023. 8:45 AM. Confirmed correct patient, procedure, site, and patient consented.   Anesthesia Topical anesthesia was used. Anesthetic medications included Lidocaine 2%, Proparacaine 0.5%.   Procedure Preparation included 5% betadine to ocular surface, eyelid speculum. A (32g) needle was used.   Injection: 1.25 mg Bevacizumab 1.25mg /0.100ml   Route: Intravitreal, Site: Right Eye   NDC: P3213405, Lot: [id number], Expiration date: 02/09/2024   Post-op Post  injection exam found visual acuity of at least counting fingers. The patient tolerated the procedure well. There were no complications. The patient received written and verbal post procedure care education. Post injection medications were not given.      Intravitreal Injection, Pharmacologic Agent - OS - Left Eye       Time Out 11/05/2023. 8:46 AM. Confirmed correct patient, procedure, site, and patient consented.   Anesthesia Topical anesthesia was used. Anesthetic medications included Lidocaine 2%, Proparacaine 0.5%.   Procedure Preparation included 5% betadine to ocular surface, eyelid speculum. A (32g) needle was used.   Injection: 1.25 mg Bevacizumab 1.25mg /0.79ml   Route: Intravitreal, Site: Left Eye   NDC: P3213405, Lot: [id number], Expiration date: 02/03/2024   Post-op Post injection exam found visual acuity of at least counting fingers. The patient tolerated the procedure well. There were no complications. The patient received written and verbal post procedure care education.           ASSESSMENT/PLAN:   ICD-10-CM   1. Proliferative diabetic retinopathy of both eyes with macular edema associated with type 2 diabetes mellitus (HCC)  E11.3513 OCT, Retina - OU - Both Eyes    Intravitreal Injection, Pharmacologic Agent - OD - Right Eye    Intravitreal Injection, Pharmacologic Agent - OS - Left Eye    Bevacizumab (AVASTIN) SOLN 1.25 mg    Bevacizumab (AVASTIN) SOLN 1.25 mg    2. 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 Essential hypertension  I10     5. Hypertensive retinopathy of both eyes  H35.033     6. Pseudophakia  Z96.1      1,2. Proliferative diabetic retinopathy, both eyes  - delayed f/u from 4 weeks to 9 weeks (08.20.24-10.22.24) due to being in the hospital  - s/p IVA OS #1 (05.14.24), IVA OD #1 (05.16.24)  - s/p IVA OU # 2 (06.25.24), #3 (07.23.24), #4 (08.20.24), #5 (10.22.24)  - s/p PRP OS (05.21.24)  - s/p  PRP OD (08.01.24)  - previously followed with Dr. Dorette Grate at [facility], but had been lost to f/u since 10.28.2020  - h/o PRP OU; had never received anti-VEGF injxns prior to here, per report - exam shows old white VH + some red subhyaloid heme OD; OS with focal clusters of preretinal heme turning white -- nasal and temporal periphery - FA (05.14.24) shows OD: Clusters of perivascular leakage inferior and temporal midzone -- ?early NV; OS: Clusters of NVE temporal midzone -- pt would benefit from fill in PRP OU (OS first--done 05.21.24) - OCT shows OD: Focal IRHM and trace cystic changes temporal fovea and macula -- improved; IT schisis - stable, +vitreous opacities -- slightly improved, partial PVD; OS: Mild interval improvement in IRF/IRHM/edema temporal fovea and macula; IN schisis caught on widefield -- not imaged today at 4 wks - recommend IVA OU #6 today 11.19.24 w/ f/u in 4 wks - pt wishes to proceed with  injections - RBA of procedure discussed, questions answered - Avastin informed consent obtained and signed 05.14.24 (OU) - see procedure note - Eylea approved for 2024 - f/u 4 weeks, DFE, OCT, possible injection(s) -- will repeat FA in 2025  3. Retinoschisis OU  - peripheral retinoschisis confirmed by OCT  - OD -- inferotemporal periphery  - OS -- nasal periphery  - stable, monitor  4,5. Hypertensive retinopathy OU - discussed importance of tight BP control - monitor  6. Pseudophakia OD  - s/p CE/IOL OD (Dr. June Leap, 06.07.24)  - IOL in good position, doing well  - monitor  7. Mixed Cataract OS - The symptoms of cataract, surgical options, and treatments and risks were discussed with patient. - discussed diagnosis and progression - under the expert management of Dr. June Leap - clear from a retina standpoint to proceed with cataract surgery when pt and surgeon are ready  Ophthalmic Meds Ordered this visit:  Meds ordered this encounter  Medications   Bevacizumab (AVASTIN) SOLN  1.25 mg   Bevacizumab (AVASTIN) SOLN 1.25 mg     Return in about 4 weeks (around 12/03/2023) for f/u PDR OU, DFE, OCT.  There are no Patient Instructions on file for this visit.  Explained the diagnoses, plan, and follow up with the patient and they expressed understanding.  Patient expressed understanding of the importance of proper follow up care.   This document serves as a record of services personally performed by Karie Chimera, MD, PhD. It was created on their behalf by Charlette Caffey, COT an ophthalmic technician. The creation of this record is the provider's dictation and/or activities during the visit.    Electronically signed by:  Charlette Caffey, COT  11/05/23 9:03 AM  This document serves as a record of services personally performed by Karie Chimera, MD, PhD. It was created on their behalf by Glee Arvin. Manson Passey, OA an ophthalmic technician. The creation of this record is the provider's dictation and/or activities during the visit.    Electronically signed by: Glee Arvin. Manson Passey, OA 11/05/23 9:03 AM  Karie Chimera, M.D., Ph.D. Diseases & Surgery of the Retina and Vitreous [facility]  I have reviewed the above documentation for accuracy and completeness, and I agree with the above. Karie Chimera, M.D., Ph.D. 11/05/23 9:04 AM   Abbreviations: M myopia (nearsighted); A astigmatism; H hyperopia (farsighted); P presbyopia; Mrx spectacle prescription;  CTL contact lenses; OD right eye; OS left eye; OU both eyes  XT exotropia; ET esotropia; PEK punctate epithelial keratitis; PEE punctate epithelial erosions; DES dry eye syndrome; MGD meibomian gland dysfunction; ATs artificial tears; PFAT's preservative free artificial tears; NSC nuclear sclerotic cataract; PSC posterior subcapsular cataract; ERM epi-retinal membrane; PVD posterior vitreous detachment; RD retinal detachment; DM diabetes mellitus; DR diabetic retinopathy; NPDR non-proliferative diabetic  retinopathy; PDR proliferative diabetic retinopathy; CSME clinically significant macular edema; DME diabetic macular edema; dbh dot blot hemorrhages; CWS cotton wool spot; POAG primary open angle glaucoma; C/D cup-to-disc ratio; HVF humphrey visual field; GVF goldmann visual field; OCT optical coherence tomography; IOP intraocular pressure; BRVO Branch retinal vein occlusion; CRVO central retinal vein occlusion; CRAO central retinal artery occlusion; BRAO branch retinal artery occlusion; RT retinal tear; SB scleral buckle; PPV pars plana vitrectomy; VH Vitreous hemorrhage; PRP panretinal laser photocoagulation; IVK intravitreal kenalog; VMT vitreomacular traction; MH Macular hole;  NVD neovascularization of the disc; NVE neovascularization elsewhere; AREDS age related eye disease study; ARMD age related macular degeneration; POAG primary  open angle glaucoma; EBMD epithelial/anterior basement membrane dystrophy; ACIOL anterior chamber intraocular lens; IOL intraocular lens; PCIOL posterior chamber intraocular lens; Phaco/IOL phacoemulsification with intraocular lens placement; PRK photorefractive keratectomy; LASIK laser assisted in situ keratomileusis; HTN hypertension; DM diabetes mellitus; COPD chronic obstructive pulmonary disease

## 2023-10-23 ENCOUNTER — Other Ambulatory Visit (HOSPITAL_COMMUNITY): Payer: Self-pay | Admitting: Nurse Practitioner

## 2023-10-23 DIAGNOSIS — R7881 Bacteremia: Secondary | ICD-10-CM | POA: Diagnosis not present

## 2023-10-23 DIAGNOSIS — N2581 Secondary hyperparathyroidism of renal origin: Secondary | ICD-10-CM | POA: Diagnosis not present

## 2023-10-23 DIAGNOSIS — N186 End stage renal disease: Secondary | ICD-10-CM

## 2023-10-23 DIAGNOSIS — B9689 Other specified bacterial agents as the cause of diseases classified elsewhere: Secondary | ICD-10-CM | POA: Diagnosis not present

## 2023-10-23 DIAGNOSIS — Z992 Dependence on renal dialysis: Secondary | ICD-10-CM | POA: Diagnosis not present

## 2023-10-23 DIAGNOSIS — D631 Anemia in chronic kidney disease: Secondary | ICD-10-CM | POA: Diagnosis not present

## 2023-10-24 ENCOUNTER — Ambulatory Visit (HOSPITAL_COMMUNITY)
Admission: RE | Admit: 2023-10-24 | Discharge: 2023-10-24 | Disposition: A | Discharge status: Home / Self Care | Payer: Medicare Other | Source: Ambulatory Visit | Attending: Nurse Practitioner | Admitting: Nurse Practitioner

## 2023-10-24 DIAGNOSIS — N186 End stage renal disease: Secondary | ICD-10-CM

## 2023-10-24 MED ORDER — LIDOCAINE HCL 1 % IJ SOLN
INTRAMUSCULAR | Status: AC
  Filled 2023-10-24: qty 20

## 2023-10-25 DIAGNOSIS — N2581 Secondary hyperparathyroidism of renal origin: Secondary | ICD-10-CM | POA: Diagnosis not present

## 2023-10-25 DIAGNOSIS — N186 End stage renal disease: Secondary | ICD-10-CM | POA: Diagnosis not present

## 2023-10-25 DIAGNOSIS — Z992 Dependence on renal dialysis: Secondary | ICD-10-CM | POA: Diagnosis not present

## 2023-10-25 DIAGNOSIS — D631 Anemia in chronic kidney disease: Secondary | ICD-10-CM | POA: Diagnosis not present

## 2023-10-25 DIAGNOSIS — B9689 Other specified bacterial agents as the cause of diseases classified elsewhere: Secondary | ICD-10-CM | POA: Diagnosis not present

## 2023-10-25 DIAGNOSIS — R7881 Bacteremia: Secondary | ICD-10-CM | POA: Diagnosis not present

## 2023-10-28 DIAGNOSIS — B9689 Other specified bacterial agents as the cause of diseases classified elsewhere: Secondary | ICD-10-CM | POA: Diagnosis not present

## 2023-10-28 DIAGNOSIS — D631 Anemia in chronic kidney disease: Secondary | ICD-10-CM | POA: Diagnosis not present

## 2023-10-28 DIAGNOSIS — R7881 Bacteremia: Secondary | ICD-10-CM | POA: Diagnosis not present

## 2023-10-28 DIAGNOSIS — N186 End stage renal disease: Secondary | ICD-10-CM | POA: Diagnosis not present

## 2023-10-28 DIAGNOSIS — N2581 Secondary hyperparathyroidism of renal origin: Secondary | ICD-10-CM | POA: Diagnosis not present

## 2023-10-28 DIAGNOSIS — Z992 Dependence on renal dialysis: Secondary | ICD-10-CM | POA: Diagnosis not present

## 2023-10-29 DIAGNOSIS — C50812 Malignant neoplasm of overlapping sites of left female breast: Secondary | ICD-10-CM | POA: Diagnosis not present

## 2023-10-29 DIAGNOSIS — E119 Type 2 diabetes mellitus without complications: Secondary | ICD-10-CM | POA: Diagnosis not present

## 2023-10-29 DIAGNOSIS — J9610 Chronic respiratory failure, unspecified whether with hypoxia or hypercapnia: Secondary | ICD-10-CM | POA: Diagnosis not present

## 2023-10-29 DIAGNOSIS — Z853 Personal history of malignant neoplasm of breast: Secondary | ICD-10-CM | POA: Diagnosis not present

## 2023-10-29 DIAGNOSIS — D0512 Intraductal carcinoma in situ of left breast: Secondary | ICD-10-CM | POA: Diagnosis not present

## 2023-10-29 DIAGNOSIS — I132 Hypertensive heart and chronic kidney disease with heart failure and with stage 5 chronic kidney disease, or end stage renal disease: Secondary | ICD-10-CM | POA: Diagnosis not present

## 2023-10-29 DIAGNOSIS — Z862 Personal history of diseases of the blood and blood-forming organs and certain disorders involving the immune mechanism: Secondary | ICD-10-CM | POA: Diagnosis not present

## 2023-10-29 DIAGNOSIS — J45909 Unspecified asthma, uncomplicated: Secondary | ICD-10-CM | POA: Diagnosis not present

## 2023-10-29 DIAGNOSIS — D509 Iron deficiency anemia, unspecified: Secondary | ICD-10-CM | POA: Diagnosis not present

## 2023-10-29 DIAGNOSIS — E113513 Type 2 diabetes mellitus with proliferative diabetic retinopathy with macular edema, bilateral: Secondary | ICD-10-CM | POA: Diagnosis not present

## 2023-10-29 DIAGNOSIS — G4733 Obstructive sleep apnea (adult) (pediatric): Secondary | ICD-10-CM | POA: Diagnosis not present

## 2023-10-29 DIAGNOSIS — E1122 Type 2 diabetes mellitus with diabetic chronic kidney disease: Secondary | ICD-10-CM | POA: Diagnosis not present

## 2023-10-29 DIAGNOSIS — Z9012 Acquired absence of left breast and nipple: Secondary | ICD-10-CM | POA: Diagnosis not present

## 2023-10-29 DIAGNOSIS — I272 Pulmonary hypertension, unspecified: Secondary | ICD-10-CM | POA: Diagnosis not present

## 2023-10-29 DIAGNOSIS — Z8679 Personal history of other diseases of the circulatory system: Secondary | ICD-10-CM | POA: Diagnosis not present

## 2023-10-29 DIAGNOSIS — N186 End stage renal disease: Secondary | ICD-10-CM | POA: Diagnosis not present

## 2023-10-29 DIAGNOSIS — Z6841 Body Mass Index (BMI) 40.0 and over, adult: Secondary | ICD-10-CM | POA: Diagnosis not present

## 2023-10-29 DIAGNOSIS — N6489 Other specified disorders of breast: Secondary | ICD-10-CM | POA: Diagnosis not present

## 2023-10-29 DIAGNOSIS — Z01818 Encounter for other preprocedural examination: Secondary | ICD-10-CM | POA: Diagnosis not present

## 2023-10-29 DIAGNOSIS — E049 Nontoxic goiter, unspecified: Secondary | ICD-10-CM | POA: Diagnosis not present

## 2023-10-29 DIAGNOSIS — I5032 Chronic diastolic (congestive) heart failure: Secondary | ICD-10-CM | POA: Diagnosis not present

## 2023-10-29 DIAGNOSIS — I10 Essential (primary) hypertension: Secondary | ICD-10-CM | POA: Diagnosis not present

## 2023-10-30 DIAGNOSIS — Z992 Dependence on renal dialysis: Secondary | ICD-10-CM | POA: Diagnosis not present

## 2023-10-30 DIAGNOSIS — N186 End stage renal disease: Secondary | ICD-10-CM | POA: Diagnosis not present

## 2023-10-30 DIAGNOSIS — D631 Anemia in chronic kidney disease: Secondary | ICD-10-CM | POA: Diagnosis not present

## 2023-10-30 DIAGNOSIS — N2581 Secondary hyperparathyroidism of renal origin: Secondary | ICD-10-CM | POA: Diagnosis not present

## 2023-10-30 DIAGNOSIS — R7881 Bacteremia: Secondary | ICD-10-CM | POA: Diagnosis not present

## 2023-10-30 DIAGNOSIS — B9689 Other specified bacterial agents as the cause of diseases classified elsewhere: Secondary | ICD-10-CM | POA: Diagnosis not present

## 2023-11-01 DIAGNOSIS — D631 Anemia in chronic kidney disease: Secondary | ICD-10-CM | POA: Diagnosis not present

## 2023-11-01 DIAGNOSIS — N186 End stage renal disease: Secondary | ICD-10-CM | POA: Diagnosis not present

## 2023-11-01 DIAGNOSIS — B9689 Other specified bacterial agents as the cause of diseases classified elsewhere: Secondary | ICD-10-CM | POA: Diagnosis not present

## 2023-11-01 DIAGNOSIS — Z992 Dependence on renal dialysis: Secondary | ICD-10-CM | POA: Diagnosis not present

## 2023-11-01 DIAGNOSIS — R7881 Bacteremia: Secondary | ICD-10-CM | POA: Diagnosis not present

## 2023-11-01 DIAGNOSIS — N2581 Secondary hyperparathyroidism of renal origin: Secondary | ICD-10-CM | POA: Diagnosis not present

## 2023-11-04 DIAGNOSIS — B9689 Other specified bacterial agents as the cause of diseases classified elsewhere: Secondary | ICD-10-CM | POA: Diagnosis not present

## 2023-11-04 DIAGNOSIS — Z992 Dependence on renal dialysis: Secondary | ICD-10-CM | POA: Diagnosis not present

## 2023-11-04 DIAGNOSIS — R7881 Bacteremia: Secondary | ICD-10-CM | POA: Diagnosis not present

## 2023-11-04 DIAGNOSIS — D631 Anemia in chronic kidney disease: Secondary | ICD-10-CM | POA: Diagnosis not present

## 2023-11-04 DIAGNOSIS — N186 End stage renal disease: Secondary | ICD-10-CM | POA: Diagnosis not present

## 2023-11-04 DIAGNOSIS — N2581 Secondary hyperparathyroidism of renal origin: Secondary | ICD-10-CM | POA: Diagnosis not present

## 2023-11-05 ENCOUNTER — Ambulatory Visit (INDEPENDENT_AMBULATORY_CARE_PROVIDER_SITE_OTHER): Payer: Medicare Other | Admitting: Ophthalmology

## 2023-11-05 ENCOUNTER — Encounter (INDEPENDENT_AMBULATORY_CARE_PROVIDER_SITE_OTHER): Payer: Self-pay | Admitting: Ophthalmology

## 2023-11-05 DIAGNOSIS — Z961 Presence of intraocular lens: Secondary | ICD-10-CM | POA: Diagnosis not present

## 2023-11-05 DIAGNOSIS — E113513 Type 2 diabetes mellitus with proliferative diabetic retinopathy with macular edema, bilateral: Secondary | ICD-10-CM

## 2023-11-05 DIAGNOSIS — H33103 Unspecified retinoschisis, bilateral: Secondary | ICD-10-CM

## 2023-11-05 DIAGNOSIS — I10 Essential (primary) hypertension: Secondary | ICD-10-CM

## 2023-11-05 DIAGNOSIS — H35033 Hypertensive retinopathy, bilateral: Secondary | ICD-10-CM

## 2023-11-05 DIAGNOSIS — Z794 Long term (current) use of insulin: Secondary | ICD-10-CM

## 2023-11-05 MED ORDER — BEVACIZUMAB CHEMO INJECTION 1.25MG/0.05ML SYRINGE FOR KALEIDOSCOPE
1.2500 mg | INTRAVITREAL | Status: AC | PRN
Start: 2023-11-05 — End: 2023-11-05
  Administered 2023-11-05: 1.25 mg via INTRAVITREAL

## 2023-11-06 ENCOUNTER — Ambulatory Visit: Payer: Medicare Other | Admitting: Internal Medicine

## 2023-11-06 ENCOUNTER — Other Ambulatory Visit: Payer: Self-pay | Admitting: Internal Medicine

## 2023-11-06 DIAGNOSIS — Z992 Dependence on renal dialysis: Secondary | ICD-10-CM | POA: Diagnosis not present

## 2023-11-06 DIAGNOSIS — N186 End stage renal disease: Secondary | ICD-10-CM | POA: Diagnosis not present

## 2023-11-06 DIAGNOSIS — N2581 Secondary hyperparathyroidism of renal origin: Secondary | ICD-10-CM | POA: Diagnosis not present

## 2023-11-06 DIAGNOSIS — D631 Anemia in chronic kidney disease: Secondary | ICD-10-CM | POA: Diagnosis not present

## 2023-11-06 DIAGNOSIS — B9689 Other specified bacterial agents as the cause of diseases classified elsewhere: Secondary | ICD-10-CM | POA: Diagnosis not present

## 2023-11-06 DIAGNOSIS — R7881 Bacteremia: Secondary | ICD-10-CM | POA: Diagnosis not present

## 2023-11-07 DIAGNOSIS — Z992 Dependence on renal dialysis: Secondary | ICD-10-CM | POA: Diagnosis not present

## 2023-11-07 DIAGNOSIS — B9689 Other specified bacterial agents as the cause of diseases classified elsewhere: Secondary | ICD-10-CM | POA: Diagnosis not present

## 2023-11-07 DIAGNOSIS — N2581 Secondary hyperparathyroidism of renal origin: Secondary | ICD-10-CM | POA: Diagnosis not present

## 2023-11-07 DIAGNOSIS — R7881 Bacteremia: Secondary | ICD-10-CM | POA: Diagnosis not present

## 2023-11-07 DIAGNOSIS — N186 End stage renal disease: Secondary | ICD-10-CM | POA: Diagnosis not present

## 2023-11-07 DIAGNOSIS — D631 Anemia in chronic kidney disease: Secondary | ICD-10-CM | POA: Diagnosis not present

## 2023-11-11 DIAGNOSIS — N2581 Secondary hyperparathyroidism of renal origin: Secondary | ICD-10-CM | POA: Diagnosis not present

## 2023-11-11 DIAGNOSIS — Z992 Dependence on renal dialysis: Secondary | ICD-10-CM | POA: Diagnosis not present

## 2023-11-11 DIAGNOSIS — D631 Anemia in chronic kidney disease: Secondary | ICD-10-CM | POA: Diagnosis not present

## 2023-11-11 DIAGNOSIS — B9689 Other specified bacterial agents as the cause of diseases classified elsewhere: Secondary | ICD-10-CM | POA: Diagnosis not present

## 2023-11-11 DIAGNOSIS — N186 End stage renal disease: Secondary | ICD-10-CM | POA: Diagnosis not present

## 2023-11-11 DIAGNOSIS — R7881 Bacteremia: Secondary | ICD-10-CM | POA: Diagnosis not present

## 2023-11-12 DIAGNOSIS — I5032 Chronic diastolic (congestive) heart failure: Secondary | ICD-10-CM | POA: Diagnosis not present

## 2023-11-12 DIAGNOSIS — Z992 Dependence on renal dialysis: Secondary | ICD-10-CM | POA: Diagnosis not present

## 2023-11-12 DIAGNOSIS — T82898A Other specified complication of vascular prosthetic devices, implants and grafts, initial encounter: Secondary | ICD-10-CM | POA: Diagnosis not present

## 2023-11-12 DIAGNOSIS — E1122 Type 2 diabetes mellitus with diabetic chronic kidney disease: Secondary | ICD-10-CM | POA: Diagnosis not present

## 2023-11-12 DIAGNOSIS — Z7984 Long term (current) use of oral hypoglycemic drugs: Secondary | ICD-10-CM | POA: Diagnosis not present

## 2023-11-12 DIAGNOSIS — J4489 Other specified chronic obstructive pulmonary disease: Secondary | ICD-10-CM | POA: Diagnosis not present

## 2023-11-12 DIAGNOSIS — D509 Iron deficiency anemia, unspecified: Secondary | ICD-10-CM | POA: Diagnosis not present

## 2023-11-12 DIAGNOSIS — E113513 Type 2 diabetes mellitus with proliferative diabetic retinopathy with macular edema, bilateral: Secondary | ICD-10-CM | POA: Diagnosis not present

## 2023-11-12 DIAGNOSIS — N186 End stage renal disease: Secondary | ICD-10-CM | POA: Diagnosis not present

## 2023-11-12 DIAGNOSIS — G8918 Other acute postprocedural pain: Secondary | ICD-10-CM | POA: Diagnosis not present

## 2023-11-12 DIAGNOSIS — I272 Pulmonary hypertension, unspecified: Secondary | ICD-10-CM | POA: Diagnosis not present

## 2023-11-12 DIAGNOSIS — I132 Hypertensive heart and chronic kidney disease with heart failure and with stage 5 chronic kidney disease, or end stage renal disease: Secondary | ICD-10-CM | POA: Diagnosis not present

## 2023-11-12 DIAGNOSIS — Z6841 Body Mass Index (BMI) 40.0 and over, adult: Secondary | ICD-10-CM | POA: Diagnosis not present

## 2023-11-12 DIAGNOSIS — Z79899 Other long term (current) drug therapy: Secondary | ICD-10-CM | POA: Diagnosis not present

## 2023-11-13 DIAGNOSIS — E113513 Type 2 diabetes mellitus with proliferative diabetic retinopathy with macular edema, bilateral: Secondary | ICD-10-CM | POA: Diagnosis not present

## 2023-11-13 DIAGNOSIS — B9689 Other specified bacterial agents as the cause of diseases classified elsewhere: Secondary | ICD-10-CM | POA: Diagnosis not present

## 2023-11-13 DIAGNOSIS — E1122 Type 2 diabetes mellitus with diabetic chronic kidney disease: Secondary | ICD-10-CM | POA: Diagnosis not present

## 2023-11-13 DIAGNOSIS — Z992 Dependence on renal dialysis: Secondary | ICD-10-CM | POA: Diagnosis not present

## 2023-11-13 DIAGNOSIS — I5032 Chronic diastolic (congestive) heart failure: Secondary | ICD-10-CM | POA: Diagnosis not present

## 2023-11-13 DIAGNOSIS — I272 Pulmonary hypertension, unspecified: Secondary | ICD-10-CM | POA: Diagnosis not present

## 2023-11-13 DIAGNOSIS — N186 End stage renal disease: Secondary | ICD-10-CM | POA: Diagnosis not present

## 2023-11-13 DIAGNOSIS — D631 Anemia in chronic kidney disease: Secondary | ICD-10-CM | POA: Diagnosis not present

## 2023-11-13 DIAGNOSIS — I132 Hypertensive heart and chronic kidney disease with heart failure and with stage 5 chronic kidney disease, or end stage renal disease: Secondary | ICD-10-CM | POA: Diagnosis not present

## 2023-11-13 DIAGNOSIS — N2581 Secondary hyperparathyroidism of renal origin: Secondary | ICD-10-CM | POA: Diagnosis not present

## 2023-11-13 DIAGNOSIS — R7881 Bacteremia: Secondary | ICD-10-CM | POA: Diagnosis not present

## 2023-11-15 ENCOUNTER — Other Ambulatory Visit (INDEPENDENT_AMBULATORY_CARE_PROVIDER_SITE_OTHER): Payer: Self-pay | Admitting: Nurse Practitioner

## 2023-11-15 ENCOUNTER — Other Ambulatory Visit: Payer: Self-pay

## 2023-11-15 ENCOUNTER — Encounter: Payer: Self-pay | Admitting: Surgery

## 2023-11-15 ENCOUNTER — Ambulatory Visit
Admission: RE | Admit: 2023-11-15 | Discharge: 2023-11-15 | Disposition: A | Discharge status: Home / Self Care | Payer: Medicare Other | Source: Ambulatory Visit | Attending: Surgery | Admitting: Surgery

## 2023-11-15 ENCOUNTER — Encounter
Admission: RE | Disposition: A | Discharge status: Home / Self Care | Payer: Self-pay | Source: Ambulatory Visit | Attending: Surgery

## 2023-11-15 DIAGNOSIS — B9689 Other specified bacterial agents as the cause of diseases classified elsewhere: Secondary | ICD-10-CM | POA: Diagnosis not present

## 2023-11-15 DIAGNOSIS — N2581 Secondary hyperparathyroidism of renal origin: Secondary | ICD-10-CM | POA: Diagnosis not present

## 2023-11-15 DIAGNOSIS — R7881 Bacteremia: Secondary | ICD-10-CM | POA: Diagnosis not present

## 2023-11-15 DIAGNOSIS — Z992 Dependence on renal dialysis: Secondary | ICD-10-CM | POA: Diagnosis not present

## 2023-11-15 DIAGNOSIS — N186 End stage renal disease: Secondary | ICD-10-CM | POA: Diagnosis not present

## 2023-11-15 DIAGNOSIS — T8249XA Other complication of vascular dialysis catheter, initial encounter: Secondary | ICD-10-CM | POA: Diagnosis not present

## 2023-11-15 DIAGNOSIS — D631 Anemia in chronic kidney disease: Secondary | ICD-10-CM | POA: Diagnosis not present

## 2023-11-15 DIAGNOSIS — Y832 Surgical operation with anastomosis, bypass or graft as the cause of abnormal reaction of the patient, or of later complication, without mention of misadventure at the time of the procedure: Secondary | ICD-10-CM | POA: Insufficient documentation

## 2023-11-15 HISTORY — PX: DIALYSIS/PERMA CATHETER INSERTION: CATH118288

## 2023-11-15 LAB — GLUCOSE, CAPILLARY: Glucose-Capillary: 140 mg/dL — ABNORMAL HIGH (ref 70–99)

## 2023-11-15 SURGERY — DIALYSIS/PERMA CATHETER INSERTION
Anesthesia: Moderate Sedation

## 2023-11-15 MED ORDER — SODIUM CHLORIDE 0.9 % IV SOLN: INTRAVENOUS | Status: DC

## 2023-11-15 MED ORDER — ONDANSETRON HCL 4 MG/2ML IJ SOLN: 4.0000 mg | Freq: Four times a day (QID) | INTRAMUSCULAR | Status: DC | PRN

## 2023-11-15 MED ORDER — FAMOTIDINE 20 MG PO TABS: 40.0000 mg | ORAL_TABLET | Freq: Once | ORAL | Status: DC | PRN

## 2023-11-15 MED ORDER — HEPARIN (PORCINE) IN NACL 1000-0.9 UT/500ML-% IV SOLN
INTRAVENOUS | Status: DC | PRN
  Administered 2023-11-15: 500 mL

## 2023-11-15 MED ORDER — HEPARIN SODIUM (PORCINE) 10000 UNIT/ML IJ SOLN
INTRAMUSCULAR | Status: DC | PRN
  Administered 2023-11-15: 10000 [IU]

## 2023-11-15 MED ORDER — MIDAZOLAM HCL 2 MG/ML PO SYRP: 8.0000 mg | ORAL_SOLUTION | Freq: Once | ORAL | Status: DC | PRN

## 2023-11-15 MED ORDER — CEFAZOLIN SODIUM-DEXTROSE 1-4 GM/50ML-% IV SOLN
INTRAVENOUS | Status: AC
Start: 2023-11-15 — End: ?
  Filled 2023-11-15: qty 50

## 2023-11-15 MED ORDER — METHYLPREDNISOLONE SODIUM SUCC 125 MG IJ SOLR: 125.0000 mg | Freq: Once | INTRAMUSCULAR | Status: DC | PRN

## 2023-11-15 MED ORDER — CEFAZOLIN SODIUM-DEXTROSE 1-4 GM/50ML-% IV SOLN
1.0000 g | INTRAVENOUS | Status: AC
  Administered 2023-11-15: 1 g via INTRAVENOUS

## 2023-11-15 MED ORDER — HYDROMORPHONE HCL 1 MG/ML IJ SOLN: 1.0000 mg | Freq: Once | INTRAMUSCULAR | Status: DC | PRN

## 2023-11-15 MED ORDER — FENTANYL CITRATE (PF) 100 MCG/2ML IJ SOLN
INTRAMUSCULAR | Status: DC | PRN
  Administered 2023-11-15 (×2): 25 ug via INTRAVENOUS

## 2023-11-15 MED ORDER — MIDAZOLAM HCL 2 MG/2ML IJ SOLN
INTRAMUSCULAR | Status: AC
Start: 2023-11-15 — End: ?
  Filled 2023-11-15: qty 2

## 2023-11-15 MED ORDER — FENTANYL CITRATE (PF) 100 MCG/2ML IJ SOLN
INTRAMUSCULAR | Status: AC
  Filled 2023-11-15: qty 2

## 2023-11-15 MED ORDER — MIDAZOLAM HCL 2 MG/2ML IJ SOLN
INTRAMUSCULAR | Status: DC | PRN
  Administered 2023-11-15: 1 mg via INTRAVENOUS

## 2023-11-15 MED ORDER — DIPHENHYDRAMINE HCL 50 MG/ML IJ SOLN: 50.0000 mg | Freq: Once | INTRAMUSCULAR | Status: DC | PRN

## 2023-11-15 MED ORDER — LIDOCAINE-EPINEPHRINE (PF) 1 %-1:200000 IJ SOLN
INTRAMUSCULAR | Status: DC | PRN
  Administered 2023-11-15: 10 mL via INTRADERMAL

## 2023-11-15 SURGICAL SUPPLY — 11 items
BIOPATCH RED 1 DISK 7.0 (GAUZE/BANDAGES/DRESSINGS) IMPLANT
CATH CANNON HEMO 15FR 31CM (HEMODIALYSIS SUPPLIES) IMPLANT
CATH PALINDROME-P 23 W/VT (CATHETERS) IMPLANT
GLIDEWIRE ADV .035X180CM (WIRE) IMPLANT
GOWN SRG XL LVL 3 NONREINFORCE (GOWNS) IMPLANT
GUIDEWIRE SUPER STIFF .035X180 (WIRE) IMPLANT
NDL ENTRY 21GA 7CM ECHOTIP (NEEDLE) IMPLANT
NEEDLE ENTRY 21GA 7CM ECHOTIP (NEEDLE) ×1 IMPLANT
SET INTRO CAPELLA COAXIAL (SET/KITS/TRAYS/PACK) IMPLANT
SUT MNCRL AB 4-0 PS2 18 (SUTURE) IMPLANT
SUT PROLENE 0 CT 1 30 (SUTURE) IMPLANT

## 2023-11-15 NOTE — H&P (Signed)
   Patient name: Patricia [name] MRN: [id number] DOB: [birth year] Sex: female    HISTORY OF PRESENT ILLNESS:   Patricia [name] is a 51 y.o. female who was at dialysis today when it was realized that the cuff from her right sided catheter was exposed.  She comes in today for catheter exchange.  She recently had a revision of a left arm access secondary to hematoma.  CURRENT MEDICATIONS:    Current Facility-Administered Medications  Medication Dose Route Frequency Provider Last Rate Last Admin   0.9 %  sodium chloride infusion   Intravenous Continuous Georgiana Spinner, NP       ceFAZolin (ANCEF) IVPB 1 g/50 mL premix  1 g Intravenous 30 min Pre-Op Georgiana Spinner, NP       diphenhydrAMINE (BENADRYL) injection 50 mg  50 mg Intravenous Once PRN Georgiana Spinner, NP       famotidine (PEPCID) tablet 40 mg  40 mg Oral Once PRN Georgiana Spinner, NP       HYDROmorphone (DILAUDID) injection 1 mg  1 mg Intravenous Once PRN Georgiana Spinner, NP       methylPREDNISolone sodium succinate (SOLU-MEDROL) 125 mg/2 mL injection 125 mg  125 mg Intravenous Once PRN Georgiana Spinner, NP       midazolam (VERSED) 2 MG/ML syrup 8 mg  8 mg Oral Once PRN Georgiana Spinner, NP       ondansetron [facility]) injection 4 mg  4 mg Intravenous Q6H PRN Georgiana Spinner, NP        REVIEW OF SYSTEMS:   [X]  denotes positive finding, [ ]  denotes negative finding Cardiac  Comments:  Chest pain or chest pressure:    Shortness of breath upon exertion:    Short of breath when lying flat:    Irregular heart rhythm:    Constitutional    Fever or chills:      PHYSICAL EXAM:   There were no vitals filed for this visit.  GENERAL: The patient is a well-nourished female, in no acute distress. The vital signs are documented above. CARDIOVASCULAR: There is a regular rate and rhythm. PULMONARY: Non-labored respirations Exposed cuff on right sided catheter  STUDIES:   None   MEDICAL ISSUES:   End-stage  renal disease: I discussed with the patient that we would plan on exchanging her existing catheter.  This will come out through a new skin exit site.  She understands this and wishes to proceed.  Her son is here to take her home.  Charlena Cross, MD, FACS [facility] [phone] Pager [phone]

## 2023-11-15 NOTE — Op Note (Signed)
    Patient name: Patricia [name] MRN: [id number] DOB: Dec 12, 1971 Sex: female  11/15/2023 Pre-operative Diagnosis: ESRD Post-operative diagnosis:  Same Surgeon:  [name] Procedure:   Exchange of right internal jugular vein tunneled dialysis catheter under fluoroscopic guidance Anesthesia: Conscious sedation with IV fentanyl and Versed  Findings: The previous catheter tip was very short and then the subclavian vein.  The cuff was exposed.  A new 31 cm catheter was placed with the tip at the cavoatrial junction  Indications: This is a 52 year old female with end-stage renal disease who was found to have an exposed cuff at dialysis today.  She comes in today for catheter exchange  Procedure:  The patient was identified in the holding area and taken to [facility]  The patient was then placed supine on the table.  Conscious sedation with IV fentanyl and Versed was administered.  The patient was prepped and draped in the usual sterile fashion.  A time out was called and antibiotics were administered.  1% lidocaine was used for local anesthesia.  I anesthetized the skin above the clavicle.  An 11 blade was used to make a skin nick.  I dissected down until I identified the catheter.  The catheter was then grasped with a hemostat.  I then transected the externalized portion of the catheter where the cuff was exposed and then brought this into the neck incision.  I then inserted a Amplatz wire however when I looked at this with fluoroscopy, the tip of the catheter was in the subclavian vein and I could not direct the Amplatz wire into the right atrium.  I switched out for a Glidewire advantage and was able to navigate this into the inferior vena cava.  The remaining catheter was removed and a peel-away sheath was placed.  I then selected a skin exit site and anesthetized this with 1% lidocaine and created a tunnel between the 2 incisions.  I then selected a 31 cm split tip catheter.  I tunneled this from the neck  incision out through the chest wall incision and placed the cuff at the skin exit site.  The catheter was then advanced into the peel-away sheath which was removed.  This was done under fluoroscopic guidance.  Catheter tip was at the cavoatrial junction and there were no kinks within the catheter.  Both ports flushed and aspirated without difficulty.  The catheter was filled the appropriate volumes of heparin.  It was sutured to the skin with Prolene and the neck incision was closed with Monocryl and Dermabond.  There were no immediate complications.   Juleen China, M.D., [facility] Office: [phone] Pager:  [phone]

## 2023-11-15 NOTE — Discharge Instructions (Signed)
Tunneled Catheter Insertion, Care After The following information offers guidance on how to care for yourself after your procedure. Your health care provider may also give you more specific instructions. If you have problems or questions, contact your health care provider. What can I expect after the procedure? After the procedure, it is common to have: Some mild redness, bruising, swelling, and pain around your catheter site. A small amount of blood or clear fluid coming from your incisions. Follow these instructions at home: Medicines Take over-the-counter and prescription medicines only as told by your health care provider. If you were prescribed an antibiotic medicine, take it as told by your health care provider. Do not stop taking the antibiotic even if you start to feel better. Incision care  Follow instructions from your health care provider about how to take care of your incisions. Make sure you: The dialysis center will change the dressing that covers your perm cath for dialysis. The skin glue on the site will gradually wear off, do not attempt to peel them off.  You need to avoid getting the area of your perm cath catheter clean and dry, so avoid showers, swimming, etc.  Check your incision areas every day for signs of infection.    Check for: More redness, swelling, or pain. More fluid or blood. Warmth. Pus or a bad smell. Catheter care  Keep your catheter site clean and dry. Leave thecaps on the ends of the catheter when not in use. Do not pull on your catheter. Activity Return to your normal activities as told by your health care provider. Ask your health care provider what activities are safe for you. Follow any other activity restrictions as instructed by your health care provider. Do not lift anything that is heavier than 10 lb (4.5 kg), or the limit that you are told, until your health care provider says that it is safe. Driving Do not drive for [id number] Ask your  health care provider if the medicine prescribed to you requires you to avoid driving or using machinery. General instructions Follow your health care provider's specific instructions for the type of catheter that you have. Do not take baths, swim, or use a hot tub until your health care provider approves. Ask your health care provider if you may take showers. Keep all follow-up visits. This is important. Contact a health care provider if: You feel unusually weak or nauseous. You have a fever or chills. You have more redness, swelling, or pain at your incisions or around the area where your catheter has been inserted or where it exits. You have pus or a bad smell coming from your catheter site. Your catheter site feels warm to the touch. Your catheter is not working properly. Fluid is leaking from the catheter, under the dressing, or around the dressing. You are unable to flush your catheter. Get help right away if: Your catheter develops a hole or it breaks. Your catheter comes loose or gets pulled completely out. If this happens, press on your catheter site firmly with a clean cloth until you can get medical help. You develop bleeding from your catheter or your insertion site, and your bleeding does not stop. You have swelling in your shoulder, neck, chest, or face. You have pain or swelling when fluids or medicines are being given through the catheter. You have chest pain or difficulty breathing. These symptoms may represent a serious problem that is an emergency. Do not wait to see if the symptoms will go away. Get  medical help right away. Call your local emergency services (911 in the [place] Do not drive yourself to the hospital. Summary After the procedure, it is common to have mild redness, swelling, and pain around your catheter site. Return to your normal activities as told by your health care provider. Ask your health care provider what activities are safe for you. Follow your  health care provider's specific instructions for the type of catheter that you have. Keep your catheter site and your dressings clean and dry. Contact a health care provider if your catheter is not working properly. Get help right away if you have chest pain, difficulty breathing, or your catheter comes loose or gets pulled completely out. This information is not intended to replace advice given to you by your health care provider. Make sure you discuss any questions you have with your health care provider. Document Revised: 04/10/2021 Document Reviewed: 04/10/2021 [employer]vier Patient Education  2023 [employer].

## 2023-11-16 DIAGNOSIS — N186 End stage renal disease: Secondary | ICD-10-CM | POA: Diagnosis not present

## 2023-11-16 DIAGNOSIS — Z992 Dependence on renal dialysis: Secondary | ICD-10-CM | POA: Diagnosis not present

## 2023-11-17 DIAGNOSIS — D631 Anemia in chronic kidney disease: Secondary | ICD-10-CM | POA: Diagnosis not present

## 2023-11-17 DIAGNOSIS — Z992 Dependence on renal dialysis: Secondary | ICD-10-CM | POA: Diagnosis not present

## 2023-11-17 DIAGNOSIS — D509 Iron deficiency anemia, unspecified: Secondary | ICD-10-CM | POA: Diagnosis not present

## 2023-11-17 DIAGNOSIS — N2581 Secondary hyperparathyroidism of renal origin: Secondary | ICD-10-CM | POA: Diagnosis not present

## 2023-11-17 DIAGNOSIS — N186 End stage renal disease: Secondary | ICD-10-CM | POA: Diagnosis not present

## 2023-11-17 DIAGNOSIS — N250 Renal osteodystrophy: Secondary | ICD-10-CM | POA: Diagnosis not present

## 2023-11-18 ENCOUNTER — Encounter: Payer: Self-pay | Admitting: Surgery

## 2023-11-18 DIAGNOSIS — D509 Iron deficiency anemia, unspecified: Secondary | ICD-10-CM | POA: Diagnosis not present

## 2023-11-18 DIAGNOSIS — Z992 Dependence on renal dialysis: Secondary | ICD-10-CM | POA: Diagnosis not present

## 2023-11-18 DIAGNOSIS — D631 Anemia in chronic kidney disease: Secondary | ICD-10-CM | POA: Diagnosis not present

## 2023-11-18 DIAGNOSIS — N2581 Secondary hyperparathyroidism of renal origin: Secondary | ICD-10-CM | POA: Diagnosis not present

## 2023-11-18 DIAGNOSIS — N250 Renal osteodystrophy: Secondary | ICD-10-CM | POA: Diagnosis not present

## 2023-11-18 DIAGNOSIS — N186 End stage renal disease: Secondary | ICD-10-CM | POA: Diagnosis not present

## 2023-11-19 NOTE — Progress Notes (Signed)
[facility] - Clinic Note  12/03/2023   CHIEF COMPLAINT Patient presents for Retina Follow Up  HISTORY OF PRESENT ILLNESS: Patricia [name] is a 52 y.o. female who presents to the clinic today for:  HPI     Retina Follow Up   Patient presents with  Diabetic Retinopathy.  In both eyes.  Severity is moderate.  Duration of 4 weeks.  Since onset it is stable.  I, the attending physician,  performed the HPI with the patient and updated documentation appropriately.        Comments   Pt here for 4 wk ret f/u PDR OU. Pt states VA is stable. Pt uses refresh gtts prn OU.       Last edited by Rennis Chris, MD on 12/03/2023 12:09 PM.    Pt states she got some reading glasses and they help her vision both near and far   Referring physician: [name], MD [phone] [place],  [place] [address]  HISTORICAL INFORMATION:  Selected notes from the MEDICAL RECORD NUMBER Referred by Dr. June Leap for macular edema LEE:  Ocular Hx- PMH-   CURRENT MEDICATIONS: Current Outpatient Medications (Ophthalmic Drugs)  Medication Sig   Polyvinyl Alcohol-Povidone (REFRESH OP) Place 1 drop into both eyes daily as needed (dry eyes).   No current facility-administered medications for this visit. (Ophthalmic Drugs)   Current Outpatient Medications (Other)  Medication Sig   acetaminophen (TYLENOL) 325 MG tablet Take 650 mg by mouth every 6 (six) hours as needed for mild pain or fever.   albuterol (VENTOLIN HFA) 108 (90 Base) MCG/ACT inhaler Inhale 2 puffs into the lungs every 6 (six) hours as needed for wheezing or shortness of breath.   ALPRAZolam (XANAX) 0.5 MG tablet Take 0.5 mg by mouth at bedtime as needed for anxiety or sleep.   aspirin EC 81 MG tablet Take 1 tablet (81 mg total) by mouth daily. Swallow whole.   ceFAZolin (ANCEF) 2-4 GM/100ML-% IVPB Inject 100 mLs (2 g total) into the vein every Monday, Wednesday, and Friday with hemodialysis. Last dose on 10/30/23 on  dialysis   cetirizine (ZYRTEC) 10 MG tablet Take 1 tablet (10 mg total) by mouth daily.   Cholecalciferol (VITAMIN D-3 PO) Take 1 tablet by mouth daily.   cinacalcet (SENSIPAR) 30 MG tablet Take 30 mg by mouth daily.   docusate sodium (COLACE) 100 MG capsule Take 100 mg by mouth daily.   ferric citrate (AURYXIA) 1 GM 210 MG(Fe) tablet Take 420 mg by mouth 2 (two) times daily with a meal. With Breakfast & with supper   glipiZIDE (GLUCOTROL) 5 MG tablet Take 1 tablet (5 mg total) by mouth daily before breakfast.   ibuprofen (ADVIL) 800 MG tablet Take 800 mg by mouth every 8 (eight) hours as needed.   ipratropium (ATROVENT) 0.06 % nasal spray Place 2 sprays into both nostrils daily as needed for rhinitis.   irbesartan (AVAPRO) 75 MG tablet Take 75 mg by mouth See admin instructions. Take 75 mg at night on non-dialysis days (Sunday, Tuesday, Thursday, Saturday)   labetalol (NORMODYNE) 100 MG tablet Take 100 mg by mouth 2 (two) times daily.   linaclotide (LINZESS) 290 MCG CAPS capsule Take 290 mcg by mouth daily before breakfast.   montelukast (SINGULAIR) 10 MG tablet Take 1 tablet (10 mg total) by mouth at bedtime.   multivitamin (RENA-VIT) TABS tablet Take 1 tablet by mouth daily.   NIFEdipine (ADALAT CC) 30 MG 24 hr tablet Take  30 mg by mouth See admin instructions. Take 30 mg at night on non-dialysis days (Sunday, Tuesday, Thursday, Saturday)   ondansetron (ZOFRAN-ODT) 4 MG disintegrating tablet Take 1 tablet (4 mg total) by mouth every 8 (eight) hours as needed for nausea or vomiting.   oxyCODONE (OXY IR/ROXICODONE) 5 MG immediate release tablet Take 1 tablet (5 mg total) by mouth every 6 (six) hours as needed for moderate pain.   OXYGEN Inhale 2 L into the lungs continuous.   pantoprazole (PROTONIX) 40 MG tablet TAKE 1 TABLET BY MOUTH EVERY DAY 30 MINS BEFORE BREAKFAST   rOPINIRole (REQUIP XL) 2 MG 24 hr tablet Take 2 mg by mouth at bedtime.    rosuvastatin (CRESTOR) 20 MG tablet Take 20 mg by  mouth daily.   sevelamer carbonate (RENVELA) 800 MG tablet Take 1,600-2,400 mg by mouth See admin instructions. Take 1600 mg at breakfast, 2400 mg at lunch and 1600 mg at dinner   TRESIBA FLEXTOUCH 200 UNIT/ML FlexTouch Pen ADMINISTER 20 UNITS UNDER THE SKIN AT BEDTIME   Current Facility-Administered Medications (Other)  Medication Route   0.9 %  sodium chloride infusion Intravenous   sodium chloride flush (NS) 0.9 % injection 3 mL Intravenous   REVIEW OF SYSTEMS: ROS   Positive for: Endocrine, Eyes, Respiratory Negative for: Constitutional, Gastrointestinal, Neurological, Skin, Genitourinary, Musculoskeletal, HENT, Cardiovascular, Psychiatric, Allergic/Imm, Heme/Lymph Last edited by Thompson Grayer, COT on 12/03/2023  8:01 AM.      ALLERGIES Allergies  Allergen Reactions   Norvasc [Amlodipine Besylate] Rash and Other (See Comments)    Dizziness    Reglan [Metoclopramide] Other (See Comments)    Hallucinations    PAST MEDICAL HISTORY Past Medical History:  Diagnosis Date   Anemia    Ankle fracture    Arthritis    Asthma    Blood transfusion without reported diagnosis    Breast cancer (HCC)    Cancer (HCC)    COVID    Diabetes mellitus without complication (HCC)    Dyspnea    End stage renal disease on dialysis (HCC)    M/W/F [facility] in [place]   GERD (gastroesophageal reflux disease)    Hypertension    OSA (obstructive sleep apnea)    uses CPAP sometimes   Pneumonia    PONV (postoperative nausea and vomiting)    Wears glasses    Past Surgical History:  Procedure Laterality Date   A/V FISTULAGRAM Left 08/13/2023   Procedure: A/V Fistulagram;  Surgeon: Nada Libman, MD;  Location: [facility] IN[facility]AS[facility];  Service: Cardiovascular;  Laterality: Left;   ABDOMINAL HYSTERECTOMY     APPLICATION OF WOUND VAC Left 05/26/2023   Procedure: APPLICATION OF WOUND VAC;  Surgeon: Victorino Sparrow, MD;  Location: [facility] OR;  Service: Vascular;  Laterality: Left;   AV FISTULA  PLACEMENT  11/2014   at [facility] hospital   AV FISTULA REPAIR N/A 04/2021   BALLOON DILATION N/A 07/10/2016   Procedure: BALLOON DILATION;  Surgeon: West Bali, MD;  Location: [facility];  Service: Endoscopy;  Laterality: N/A;  Pyloric dilation   BREAST LUMPECTOMY     CATARACT EXTRACTION W/PHACO Right 05/24/2023   Procedure: CATARACT EXTRACTION PHACO AND INTRAOCULAR LENS PLACEMENT (IOC);  Surgeon: [name], MD;  Location: [facility] ORS;  Service: Ophthalmology;  Laterality: Right;  CDE: 5.00   CESAREAN SECTION     CHOLECYSTECTOMY     COLONOSCOPY  02/2023   COLONOSCOPY WITH PROPOFOL N/A 09/27/2016   Dr. Jena Gauss: Internal hemorrhoids repeat  colonoscopy in 10 years   COLONOSCOPY WITH PROPOFOL N/A 02/28/2023   Procedure: COLONOSCOPY WITH PROPOFOL;  Surgeon: Corbin Ade, MD;  Location: [facility];  Service: Endoscopy;  Laterality: N/A;  11:45 am, asa 3 dialysis pt   DIALYSIS/PERMA CATHETER INSERTION N/A 11/15/2023   Procedure: DIALYSIS/PERMA CATHETER INSERTION;  Surgeon: Nada Libman, MD;  Location: [facility] INVASIVE CV LAB;  Service: Cardiovascular;  Laterality: N/A;   DILATION AND CURETTAGE OF UTERUS     ESOPHAGOGASTRODUODENOSCOPY N/A 07/10/2016   Dr.Fields- normal esophagus, gastric stenosis was found at the pylorus, gastritis on bx, normal examined duodenun   ESOPHAGOGASTRODUODENOSCOPY (EGD) WITH PROPOFOL N/A 11/21/2022   Procedure: ESOPHAGOGASTRODUODENOSCOPY (EGD) WITH PROPOFOL;  Surgeon: Dolores Frame, MD;  [facility]: [facility];  Service: Gastroenterology;  Laterality: N/A;   EXTERNAL FIXATION REMOVAL Right 10/29/2018   Procedure: REMOVAL RIGHT ANKLE BIOMET ZIMMER EXTERNAL FIXATOR, SHORT LEG CAST APPLICATION;  Surgeon: Eldred Manges, MD;  Location: [facility] OR;  Service: Orthopedics;  Laterality: Right;   INSERTION OF DIALYSIS CATHETER  05/26/2023   Procedure: INSERTION OF Left internal jugular DIALYSIS CATHETER;  Surgeon: Victorino Sparrow, MD;  Location: [facility] OR;  Service:  Vascular;;   IR FLUORO GUIDE CV LINE LEFT  08/28/2023   IR FLUORO GUIDE CV LINE RIGHT  09/24/2023   IR PATIENT EVAL TECH 0-60 MINS  09/24/2023   IR US GUIDE VASC ACCESS LEFT  08/28/2023   IR US GUIDE VASC ACCESS RIGHT  09/24/2023   MASTECTOMY     left sided   ORIF ANKLE FRACTURE Right 10/06/2018   Procedure: OPEN REDUCTION INTERNAL FIXATION (ORIF) RIGHT ANKLE TRIMALLEOLAR;  Surgeon: Eldred Manges, MD;  Location: [facility] OR;  Service: Orthopedics;  Laterality: Right;   PERIPHERAL VASCULAR BALLOON ANGIOPLASTY  08/13/2023   Procedure: PERIPHERAL VASCULAR BALLOON ANGIOPLASTY;  Surgeon: Nada Libman, MD;  Location: [facility];  Service: Cardiovascular;;   REVISON OF ARTERIOVENOUS FISTULA Left 05/26/2023   Procedure: REVISON OF ARTERIOVENOUS FISTULA LEFT ARM HEMATOMA WASH OUT;  Surgeon: Victorino Sparrow, MD;  Location: [facility] OR;  Service: Vascular;  Laterality: Left;   RIGHT/LEFT HEART CATH AND CORONARY ANGIOGRAPHY N/A 11/27/2022   Procedure: RIGHT/LEFT HEART CATH AND CORONARY ANGIOGRAPHY;  Surgeon: Swaziland, Peter M, MD;  Location: [facility] INVASIVE CV LAB;  Service: Cardiovascular;  Laterality: N/A;   TEE WITHOUT CARDIOVERSION N/A 09/18/2023   Procedure: TRANSESOPHAGEAL ECHOCARDIOGRAM (TEE);  Surgeon: Jonelle Sidle, MD;  Location: [facility];  Service: Cardiovascular;  Laterality: N/A;   FAMILY HISTORY Family History  Problem Relation Age of Onset   Diabetes Mellitus II Mother    Hypertension Mother    Heart block Mother    Hypertension Sister    Hypertension Sister    Colon cancer Neg Hx    SOCIAL HISTORY Social History   Tobacco Use   Smoking status: Never   Smokeless tobacco: Never  Vaping Use   Vaping status: Never Used  Substance Use Topics   Alcohol use: No    Alcohol/week: 0.0 standard drinks of alcohol   Drug use: No       OPHTHALMIC EXAM:  Base Eye Exam     Visual Acuity (Snellen - Linear)       Right Left   Dist [place] 20/70 -2 20/50 -2   Dist ph [place] 20/60 -1 NI          Tonometry (Tonopen, 8:11 AM)       Right Left   Pressure 21 18  Pupils       Pupils Dark Light Shape React APD   Right PERRL 3 2 Round Brisk None   Left PERRL 3 2 Round Brisk None         Visual Fields (Counting fingers)       Left Right    Full Full         Extraocular Movement       Right Left    Full, Ortho Full, Ortho         Neuro/Psych     Oriented x3: Yes   Mood/Affect: Normal         Dilation     Both eyes: 1.0% Mydriacyl, 2.5% Phenylephrine @ 8:12 AM           Slit Lamp and Fundus Exam     Slit Lamp Exam       Right Left   Lids/Lashes Dermatochalasis - upper lid Dermatochalasis - upper lid   Conjunctiva/Sclera mild melanosis mild melanosis   Cornea mild arcus, well healed cataract wound mild arcus, 1+ Punctate epithelial erosions   Anterior Chamber deep and clear deep and clear   Iris Round and reactive, No NVI Round and dilated, No NVI   Lens PC IOL in good position 2-3+ Nuclear sclerosis, 2-3+ Cortical cataract   Anterior Vitreous mild syneresis, old white VH settled inferiorly, scattered vitreous condensations, Posterior vitreous detachment mild syneresis, scattered fibrosis         Fundus Exam       Right Left   Disc Pink and Sharp, no NVD mild Pallor, Sharp rim, no NVD   C/D Ratio 0.2 0.3   Macula Blunted foveal reflex, scattered MA, focal pigment clump temporal fovea, trace cystic changes Flat, Blunted foveal reflex, central edema and exudates, scattered MA   Vessels attenuated, Tortuous attenuated, mild tortuosity   Periphery Attached, scattered PRP 360 with good fill in changes, scattered MA/DBH, peripheral schisis IT with old, white VH overlying, red boat-shaped subhyaloid heme inferiorly -- resolved Attached, 360 peripheral PRP with good fill in changes, scattered patches of pre-retinal heme turning white           IMAGING AND PROCEDURES  Imaging and Procedures for 12/03/2023  OCT, Retina - OU - Both Eyes        Right Eye Quality was good. Central Foveal Thickness: 258. Progression has been stable. Findings include normal foveal contour, no SRF, intraretinal hyper-reflective material, intraretinal fluid (Focal IRHM and trace cystic changes temporal fovea and macula; IT schisis - stable, +vitreous opacities -- slightly increased, partial PVD).   Left Eye Quality was good. Central Foveal Thickness: 412. Progression has improved. Findings include no SRF, abnormal foveal contour, intraretinal hyper-reflective material, intraretinal fluid, vitreomacular adhesion (Mild interval improvement in IRF/IRHM/edema temporal fovea and macula; IN schisis caught on widefield - stable).   Notes *Images captured and stored on drive  Diagnosis / Impression:  +DME OU + peripheral retinoschisis OU OD: Focal IRHM and trace cystic changes temporal fovea and macula; IT schisis - stable, +vitreous opacities -- slightly increased, partial PVD  OS: Mild interval improvement in IRF/IRHM/edema temporal fovea and macula; IN schisis caught on widefield - stable  Clinical management:  See below  Abbreviations: NFP - Normal foveal profile. CME - cystoid macular edema. PED - pigment epithelial detachment. IRF - intraretinal fluid. SRF - subretinal fluid. EZ - ellipsoid zone. ERM - epiretinal membrane. ORA - outer retinal atrophy. ORT - outer retinal tubulation. SRHM - subretinal hyper-reflective material. IRHM -  intraretinal hyper-reflective material      Intravitreal Injection, Pharmacologic Agent - OD - Right Eye       Time Out 12/03/2023. 8:52 AM. Confirmed correct patient, procedure, site, and patient consented.   Anesthesia Topical anesthesia was used. Anesthetic medications included Lidocaine 2%, Proparacaine 0.5%.   Procedure Preparation included 5% betadine to ocular surface, eyelid speculum. A (32g) needle was used.   Injection: 1.25 mg Bevacizumab 1.25mg /0.25ml   Route: Intravitreal, Site: Right Eye    NDC: P3213405, Lot: [id number], Expiration date: 03/02/2024   Post-op Post injection exam found visual acuity of at least counting fingers. The patient tolerated the procedure well. There were no complications. The patient received written and verbal post procedure care education. Post injection medications were not given.      Intravitreal Injection, Pharmacologic Agent - OS - Left Eye       Time Out 12/03/2023. 8:53 AM. Confirmed correct patient, procedure, site, and patient consented.   Anesthesia Topical anesthesia was used. Anesthetic medications included Lidocaine 2%, Proparacaine 0.5%.   Procedure Preparation included 5% betadine to ocular surface, eyelid speculum. A (32g) needle was used.   Injection: 1.25 mg Bevacizumab 1.25mg /0.2ml   Route: Intravitreal, Site: Left Eye   NDC: P3213405, Lot: [id number], Expiration date: 02/24/2024   Post-op Post injection exam found visual acuity of at least counting fingers. The patient tolerated the procedure well. There were no complications. The patient received written and verbal post procedure care education.            ASSESSMENT/PLAN:   ICD-10-CM   1. Proliferative diabetic retinopathy of both eyes with macular edema associated with type 2 diabetes mellitus (HCC)  E11.3513 OCT, Retina - OU - Both Eyes    Intravitreal Injection, Pharmacologic Agent - OD - Right Eye    Intravitreal Injection, Pharmacologic Agent - OS - Left Eye    Bevacizumab (AVASTIN) SOLN 1.25 mg    Bevacizumab (AVASTIN) SOLN 1.25 mg    2. 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 Essential hypertension  I10     5. Hypertensive retinopathy of both eyes  H35.033     6. Pseudophakia  Z96.1     7. Combined forms of age-related cataract of left eye  H25.812      1,2. Proliferative diabetic retinopathy, both eyes  - delayed f/u from 4 weeks to 9 weeks (08.20.24-10.22.24) due to being in the hospital  - s/p  IVA OS #1 (05.14.24), IVA OD #1 (05.16.24)  - s/p IVA OU # 2 (06.25.24), #3 (07.23.24), #4 (08.20.24), #5 (10.22.24), #6 (11.19.24)  - s/p PRP OS (05.21.24)  - s/p PRP OD (08.01.24)  - previously followed with Dr. Dorette Grate at [facility], but had been lost to f/u since 10.28.2020  - h/o PRP OU; had never received anti-VEGF injxns prior to here, per report - exam shows old white VH + some red subhyaloid heme OD; OS with focal clusters of preretinal heme turning white -- nasal and temporal periphery - FA (05.14.24) shows OD: Clusters of perivascular leakage inferior and temporal midzone -- ?early NV; OS: Clusters of NVE temporal midzone -- pt would benefit from fill in PRP OU (OS first--done 05.21.24) - OCT shows WG:NFAOZ IRHM and trace cystic changes temporal fovea and macula; IT schisis - stable, +vitreous opacities -- slightly increased, partial PVD; OS: Mild interval improvement in IRF/IRHM/edema temporal fovea and macula; IN schisis caught on widefield -- stable  at 4 weeks - recommend IVA OU #7 today 12.17.24 w/ f/u in 4 wks - pt wishes to proceed with injections - RBA of procedure discussed, questions answered - Avastin informed consent obtained and signed 05.14.24 (OU) - see procedure note - Eylea approved for 2024 - f/u 4 weeks, DFE, OCT, possible injection(s) -- will repeat FA in 2025  3. Retinoschisis OU  - peripheral retinoschisis confirmed by OCT  - OD -- inferotemporal periphery  - OS -- nasal periphery  - stable, monitor  4,5. Hypertensive retinopathy OU - discussed importance of tight BP control - monitor  6. Pseudophakia OD  - s/p CE/IOL OD (Dr. June Leap, 06.07.24)  - IOL in good position, doing well  - monitor  7. Mixed Cataract OS - The symptoms of cataract, surgical options, and treatments and risks were discussed with patient. - discussed diagnosis and progression - under the expert management of Dr. June Leap - clear from a retina standpoint to proceed with cataract surgery  when pt and surgeon are ready  Ophthalmic Meds Ordered this visit:  Meds ordered this encounter  Medications   Bevacizumab (AVASTIN) SOLN 1.25 mg   Bevacizumab (AVASTIN) SOLN 1.25 mg     Return in about 4 weeks (around 12/31/2023) for f/u PDR OU, DFE, OCT.  There are no Patient Instructions on file for this visit.  Explained the diagnoses, plan, and follow up with the patient and they expressed understanding.  Patient expressed understanding of the importance of proper follow up care.   This document serves as a record of services personally performed by Karie Chimera, MD, PhD. It was created on their behalf by Charlette Caffey, COT an ophthalmic technician. The creation of this record is the provider's dictation and/or activities during the visit.    Electronically signed by:  Charlette Caffey, COT  12/03/23 12:28 PM  This document serves as a record of services personally performed by Karie Chimera, MD, PhD. It was created on their behalf by Glee Arvin. Manson Passey, OA an ophthalmic technician. The creation of this record is the provider's dictation and/or activities during the visit.    Electronically signed by: Glee Arvin. Manson Passey, OA 12/03/23 12:28 PM  Karie Chimera, M.D., Ph.D. Diseases & Surgery of the Retina and Vitreous [facility]  I have reviewed the above documentation for accuracy and completeness, and I agree with the above. Karie Chimera, M.D., Ph.D. 12/03/23 12:28 PM  Abbreviations: M myopia (nearsighted); A astigmatism; H hyperopia (farsighted); P presbyopia; Mrx spectacle prescription;  CTL contact lenses; OD right eye; OS left eye; OU both eyes  XT exotropia; ET esotropia; PEK punctate epithelial keratitis; PEE punctate epithelial erosions; DES dry eye syndrome; MGD meibomian gland dysfunction; ATs artificial tears; PFAT's preservative free artificial tears; NSC nuclear sclerotic cataract; PSC posterior subcapsular cataract; ERM epi-retinal  membrane; PVD posterior vitreous detachment; RD retinal detachment; DM diabetes mellitus; DR diabetic retinopathy; NPDR non-proliferative diabetic retinopathy; PDR proliferative diabetic retinopathy; CSME clinically significant macular edema; DME diabetic macular edema; dbh dot blot hemorrhages; CWS cotton wool spot; POAG primary open angle glaucoma; C/D cup-to-disc ratio; HVF humphrey visual field; GVF goldmann visual field; OCT optical coherence tomography; IOP intraocular pressure; BRVO Branch retinal vein occlusion; CRVO central retinal vein occlusion; CRAO central retinal artery occlusion; BRAO branch retinal artery occlusion; RT retinal tear; SB scleral buckle; PPV pars plana vitrectomy; VH Vitreous hemorrhage; PRP panretinal laser photocoagulation; IVK intravitreal kenalog; VMT vitreomacular traction; MH Macular hole;  NVD  neovascularization of the disc; NVE neovascularization elsewhere; AREDS age related eye disease study; ARMD age related macular degeneration; POAG primary open angle glaucoma; EBMD epithelial/anterior basement membrane dystrophy; ACIOL anterior chamber intraocular lens; IOL intraocular lens; PCIOL posterior chamber intraocular lens; Phaco/IOL phacoemulsification with intraocular lens placement; PRK photorefractive keratectomy; LASIK laser assisted in situ keratomileusis; HTN hypertension; DM diabetes mellitus; COPD chronic obstructive pulmonary disease

## 2023-11-20 DIAGNOSIS — Z992 Dependence on renal dialysis: Secondary | ICD-10-CM | POA: Diagnosis not present

## 2023-11-20 DIAGNOSIS — D509 Iron deficiency anemia, unspecified: Secondary | ICD-10-CM | POA: Diagnosis not present

## 2023-11-20 DIAGNOSIS — N250 Renal osteodystrophy: Secondary | ICD-10-CM | POA: Diagnosis not present

## 2023-11-20 DIAGNOSIS — D631 Anemia in chronic kidney disease: Secondary | ICD-10-CM | POA: Diagnosis not present

## 2023-11-20 DIAGNOSIS — N2581 Secondary hyperparathyroidism of renal origin: Secondary | ICD-10-CM | POA: Diagnosis not present

## 2023-11-20 DIAGNOSIS — N186 End stage renal disease: Secondary | ICD-10-CM | POA: Diagnosis not present

## 2023-11-22 DIAGNOSIS — N2581 Secondary hyperparathyroidism of renal origin: Secondary | ICD-10-CM | POA: Diagnosis not present

## 2023-11-22 DIAGNOSIS — N250 Renal osteodystrophy: Secondary | ICD-10-CM | POA: Diagnosis not present

## 2023-11-22 DIAGNOSIS — D509 Iron deficiency anemia, unspecified: Secondary | ICD-10-CM | POA: Diagnosis not present

## 2023-11-22 DIAGNOSIS — Z992 Dependence on renal dialysis: Secondary | ICD-10-CM | POA: Diagnosis not present

## 2023-11-22 DIAGNOSIS — N186 End stage renal disease: Secondary | ICD-10-CM | POA: Diagnosis not present

## 2023-11-22 DIAGNOSIS — D631 Anemia in chronic kidney disease: Secondary | ICD-10-CM | POA: Diagnosis not present

## 2023-11-25 DIAGNOSIS — D631 Anemia in chronic kidney disease: Secondary | ICD-10-CM | POA: Diagnosis not present

## 2023-11-25 DIAGNOSIS — N186 End stage renal disease: Secondary | ICD-10-CM | POA: Diagnosis not present

## 2023-11-25 DIAGNOSIS — Z992 Dependence on renal dialysis: Secondary | ICD-10-CM | POA: Diagnosis not present

## 2023-11-25 DIAGNOSIS — D509 Iron deficiency anemia, unspecified: Secondary | ICD-10-CM | POA: Diagnosis not present

## 2023-11-25 DIAGNOSIS — N250 Renal osteodystrophy: Secondary | ICD-10-CM | POA: Diagnosis not present

## 2023-11-25 DIAGNOSIS — N2581 Secondary hyperparathyroidism of renal origin: Secondary | ICD-10-CM | POA: Diagnosis not present

## 2023-11-27 DIAGNOSIS — Z992 Dependence on renal dialysis: Secondary | ICD-10-CM | POA: Diagnosis not present

## 2023-11-27 DIAGNOSIS — N250 Renal osteodystrophy: Secondary | ICD-10-CM | POA: Diagnosis not present

## 2023-11-27 DIAGNOSIS — D509 Iron deficiency anemia, unspecified: Secondary | ICD-10-CM | POA: Diagnosis not present

## 2023-11-27 DIAGNOSIS — D631 Anemia in chronic kidney disease: Secondary | ICD-10-CM | POA: Diagnosis not present

## 2023-11-27 DIAGNOSIS — N186 End stage renal disease: Secondary | ICD-10-CM | POA: Diagnosis not present

## 2023-11-27 DIAGNOSIS — N2581 Secondary hyperparathyroidism of renal origin: Secondary | ICD-10-CM | POA: Diagnosis not present

## 2023-11-29 DIAGNOSIS — N2581 Secondary hyperparathyroidism of renal origin: Secondary | ICD-10-CM | POA: Diagnosis not present

## 2023-11-29 DIAGNOSIS — N250 Renal osteodystrophy: Secondary | ICD-10-CM | POA: Diagnosis not present

## 2023-11-29 DIAGNOSIS — Z992 Dependence on renal dialysis: Secondary | ICD-10-CM | POA: Diagnosis not present

## 2023-11-29 DIAGNOSIS — D509 Iron deficiency anemia, unspecified: Secondary | ICD-10-CM | POA: Diagnosis not present

## 2023-11-29 DIAGNOSIS — D631 Anemia in chronic kidney disease: Secondary | ICD-10-CM | POA: Diagnosis not present

## 2023-11-29 DIAGNOSIS — N186 End stage renal disease: Secondary | ICD-10-CM | POA: Diagnosis not present

## 2023-12-02 DIAGNOSIS — N2581 Secondary hyperparathyroidism of renal origin: Secondary | ICD-10-CM | POA: Diagnosis not present

## 2023-12-02 DIAGNOSIS — Z992 Dependence on renal dialysis: Secondary | ICD-10-CM | POA: Diagnosis not present

## 2023-12-02 DIAGNOSIS — N250 Renal osteodystrophy: Secondary | ICD-10-CM | POA: Diagnosis not present

## 2023-12-02 DIAGNOSIS — D631 Anemia in chronic kidney disease: Secondary | ICD-10-CM | POA: Diagnosis not present

## 2023-12-02 DIAGNOSIS — N186 End stage renal disease: Secondary | ICD-10-CM | POA: Diagnosis not present

## 2023-12-02 DIAGNOSIS — D509 Iron deficiency anemia, unspecified: Secondary | ICD-10-CM | POA: Diagnosis not present

## 2023-12-03 ENCOUNTER — Ambulatory Visit (INDEPENDENT_AMBULATORY_CARE_PROVIDER_SITE_OTHER): Payer: Medicare Other | Admitting: Ophthalmology

## 2023-12-03 ENCOUNTER — Encounter (INDEPENDENT_AMBULATORY_CARE_PROVIDER_SITE_OTHER): Payer: Self-pay | Admitting: Ophthalmology

## 2023-12-03 DIAGNOSIS — H25812 Combined forms of age-related cataract, left eye: Secondary | ICD-10-CM | POA: Diagnosis not present

## 2023-12-03 DIAGNOSIS — E113513 Type 2 diabetes mellitus with proliferative diabetic retinopathy with macular edema, bilateral: Secondary | ICD-10-CM

## 2023-12-03 DIAGNOSIS — I10 Essential (primary) hypertension: Secondary | ICD-10-CM

## 2023-12-03 DIAGNOSIS — Z794 Long term (current) use of insulin: Secondary | ICD-10-CM

## 2023-12-03 DIAGNOSIS — Z961 Presence of intraocular lens: Secondary | ICD-10-CM

## 2023-12-03 DIAGNOSIS — H35033 Hypertensive retinopathy, bilateral: Secondary | ICD-10-CM

## 2023-12-03 DIAGNOSIS — H33103 Unspecified retinoschisis, bilateral: Secondary | ICD-10-CM | POA: Diagnosis not present

## 2023-12-03 MED ORDER — BEVACIZUMAB CHEMO INJECTION 1.25MG/0.05ML SYRINGE FOR KALEIDOSCOPE
1.2500 mg | INTRAVITREAL | Status: AC | PRN
  Administered 2023-12-03: 1.25 mg via INTRAVITREAL

## 2023-12-04 DIAGNOSIS — D509 Iron deficiency anemia, unspecified: Secondary | ICD-10-CM | POA: Diagnosis not present

## 2023-12-04 DIAGNOSIS — N250 Renal osteodystrophy: Secondary | ICD-10-CM | POA: Diagnosis not present

## 2023-12-04 DIAGNOSIS — N2581 Secondary hyperparathyroidism of renal origin: Secondary | ICD-10-CM | POA: Diagnosis not present

## 2023-12-04 DIAGNOSIS — N186 End stage renal disease: Secondary | ICD-10-CM | POA: Diagnosis not present

## 2023-12-04 DIAGNOSIS — Z992 Dependence on renal dialysis: Secondary | ICD-10-CM | POA: Diagnosis not present

## 2023-12-04 DIAGNOSIS — D631 Anemia in chronic kidney disease: Secondary | ICD-10-CM | POA: Diagnosis not present

## 2023-12-05 DIAGNOSIS — Z992 Dependence on renal dialysis: Secondary | ICD-10-CM | POA: Diagnosis not present

## 2023-12-05 DIAGNOSIS — N186 End stage renal disease: Secondary | ICD-10-CM | POA: Diagnosis not present

## 2023-12-05 DIAGNOSIS — I120 Hypertensive chronic kidney disease with stage 5 chronic kidney disease or end stage renal disease: Secondary | ICD-10-CM | POA: Diagnosis not present

## 2023-12-06 DIAGNOSIS — N186 End stage renal disease: Secondary | ICD-10-CM | POA: Diagnosis not present

## 2023-12-06 DIAGNOSIS — N2581 Secondary hyperparathyroidism of renal origin: Secondary | ICD-10-CM | POA: Diagnosis not present

## 2023-12-06 DIAGNOSIS — Z992 Dependence on renal dialysis: Secondary | ICD-10-CM | POA: Diagnosis not present

## 2023-12-06 DIAGNOSIS — D631 Anemia in chronic kidney disease: Secondary | ICD-10-CM | POA: Diagnosis not present

## 2023-12-06 DIAGNOSIS — D509 Iron deficiency anemia, unspecified: Secondary | ICD-10-CM | POA: Diagnosis not present

## 2023-12-06 DIAGNOSIS — N250 Renal osteodystrophy: Secondary | ICD-10-CM | POA: Diagnosis not present

## 2023-12-09 ENCOUNTER — Ambulatory Visit: Payer: Medicare Other | Admitting: Adult Health

## 2023-12-09 DIAGNOSIS — Z992 Dependence on renal dialysis: Secondary | ICD-10-CM | POA: Diagnosis not present

## 2023-12-09 DIAGNOSIS — N186 End stage renal disease: Secondary | ICD-10-CM | POA: Diagnosis not present

## 2023-12-09 DIAGNOSIS — N2581 Secondary hyperparathyroidism of renal origin: Secondary | ICD-10-CM | POA: Diagnosis not present

## 2023-12-12 DIAGNOSIS — Z992 Dependence on renal dialysis: Secondary | ICD-10-CM | POA: Diagnosis not present

## 2023-12-12 DIAGNOSIS — N186 End stage renal disease: Secondary | ICD-10-CM | POA: Diagnosis not present

## 2023-12-12 DIAGNOSIS — N2581 Secondary hyperparathyroidism of renal origin: Secondary | ICD-10-CM | POA: Diagnosis not present

## 2023-12-13 DIAGNOSIS — N250 Renal osteodystrophy: Secondary | ICD-10-CM | POA: Diagnosis not present

## 2023-12-13 DIAGNOSIS — D509 Iron deficiency anemia, unspecified: Secondary | ICD-10-CM | POA: Diagnosis not present

## 2023-12-13 DIAGNOSIS — N186 End stage renal disease: Secondary | ICD-10-CM | POA: Diagnosis not present

## 2023-12-13 DIAGNOSIS — D631 Anemia in chronic kidney disease: Secondary | ICD-10-CM | POA: Diagnosis not present

## 2023-12-13 DIAGNOSIS — Z992 Dependence on renal dialysis: Secondary | ICD-10-CM | POA: Diagnosis not present

## 2023-12-13 DIAGNOSIS — N2581 Secondary hyperparathyroidism of renal origin: Secondary | ICD-10-CM | POA: Diagnosis not present

## 2023-12-14 DIAGNOSIS — N186 End stage renal disease: Secondary | ICD-10-CM | POA: Diagnosis not present

## 2023-12-14 DIAGNOSIS — N2581 Secondary hyperparathyroidism of renal origin: Secondary | ICD-10-CM | POA: Diagnosis not present

## 2023-12-14 DIAGNOSIS — Z992 Dependence on renal dialysis: Secondary | ICD-10-CM | POA: Diagnosis not present

## 2023-12-16 ENCOUNTER — Other Ambulatory Visit: Payer: Self-pay

## 2023-12-16 DIAGNOSIS — E1122 Type 2 diabetes mellitus with diabetic chronic kidney disease: Secondary | ICD-10-CM

## 2023-12-16 DIAGNOSIS — D509 Iron deficiency anemia, unspecified: Secondary | ICD-10-CM | POA: Diagnosis not present

## 2023-12-16 DIAGNOSIS — Z992 Dependence on renal dialysis: Secondary | ICD-10-CM | POA: Diagnosis not present

## 2023-12-16 DIAGNOSIS — N186 End stage renal disease: Secondary | ICD-10-CM | POA: Diagnosis not present

## 2023-12-16 DIAGNOSIS — N2581 Secondary hyperparathyroidism of renal origin: Secondary | ICD-10-CM | POA: Diagnosis not present

## 2023-12-16 DIAGNOSIS — N250 Renal osteodystrophy: Secondary | ICD-10-CM | POA: Diagnosis not present

## 2023-12-16 DIAGNOSIS — D631 Anemia in chronic kidney disease: Secondary | ICD-10-CM | POA: Diagnosis not present

## 2023-12-16 MED ORDER — TRESIBA FLEXTOUCH 200 UNIT/ML ~~LOC~~ SOPN: 30.0000 [IU] | PEN_INJECTOR | Freq: Every day | SUBCUTANEOUS | 0 refills | Status: DC

## 2023-12-17 DIAGNOSIS — N186 End stage renal disease: Secondary | ICD-10-CM | POA: Diagnosis not present

## 2023-12-17 DIAGNOSIS — N185 Chronic kidney disease, stage 5: Secondary | ICD-10-CM | POA: Diagnosis not present

## 2023-12-17 DIAGNOSIS — I132 Hypertensive heart and chronic kidney disease with heart failure and with stage 5 chronic kidney disease, or end stage renal disease: Secondary | ICD-10-CM | POA: Diagnosis not present

## 2023-12-17 DIAGNOSIS — Z992 Dependence on renal dialysis: Secondary | ICD-10-CM | POA: Diagnosis not present

## 2023-12-17 DIAGNOSIS — Z9981 Dependence on supplemental oxygen: Secondary | ICD-10-CM | POA: Diagnosis not present

## 2023-12-18 DIAGNOSIS — N2581 Secondary hyperparathyroidism of renal origin: Secondary | ICD-10-CM | POA: Diagnosis not present

## 2023-12-18 DIAGNOSIS — D631 Anemia in chronic kidney disease: Secondary | ICD-10-CM | POA: Diagnosis not present

## 2023-12-18 DIAGNOSIS — D509 Iron deficiency anemia, unspecified: Secondary | ICD-10-CM | POA: Diagnosis not present

## 2023-12-18 DIAGNOSIS — N250 Renal osteodystrophy: Secondary | ICD-10-CM | POA: Diagnosis not present

## 2023-12-18 DIAGNOSIS — Z992 Dependence on renal dialysis: Secondary | ICD-10-CM | POA: Diagnosis not present

## 2023-12-18 DIAGNOSIS — N186 End stage renal disease: Secondary | ICD-10-CM | POA: Diagnosis not present

## 2023-12-20 DIAGNOSIS — D509 Iron deficiency anemia, unspecified: Secondary | ICD-10-CM | POA: Diagnosis not present

## 2023-12-20 DIAGNOSIS — Z992 Dependence on renal dialysis: Secondary | ICD-10-CM | POA: Diagnosis not present

## 2023-12-20 DIAGNOSIS — N250 Renal osteodystrophy: Secondary | ICD-10-CM | POA: Diagnosis not present

## 2023-12-20 DIAGNOSIS — N2581 Secondary hyperparathyroidism of renal origin: Secondary | ICD-10-CM | POA: Diagnosis not present

## 2023-12-20 DIAGNOSIS — N186 End stage renal disease: Secondary | ICD-10-CM | POA: Diagnosis not present

## 2023-12-20 DIAGNOSIS — D631 Anemia in chronic kidney disease: Secondary | ICD-10-CM | POA: Diagnosis not present

## 2023-12-23 DIAGNOSIS — Z992 Dependence on renal dialysis: Secondary | ICD-10-CM | POA: Diagnosis not present

## 2023-12-23 DIAGNOSIS — D631 Anemia in chronic kidney disease: Secondary | ICD-10-CM | POA: Diagnosis not present

## 2023-12-23 DIAGNOSIS — D509 Iron deficiency anemia, unspecified: Secondary | ICD-10-CM | POA: Diagnosis not present

## 2023-12-23 DIAGNOSIS — N186 End stage renal disease: Secondary | ICD-10-CM | POA: Diagnosis not present

## 2023-12-23 DIAGNOSIS — N250 Renal osteodystrophy: Secondary | ICD-10-CM | POA: Diagnosis not present

## 2023-12-23 DIAGNOSIS — N2581 Secondary hyperparathyroidism of renal origin: Secondary | ICD-10-CM | POA: Diagnosis not present

## 2023-12-24 NOTE — Progress Notes (Signed)
[facility] - Clinic Note  01/07/2024   CHIEF COMPLAINT Patient presents for Retina Follow Up  HISTORY OF PRESENT ILLNESS: Patricia [name] is a 53 y.o. female who presents to the clinic today for:  HPI     Retina Follow Up   Patient presents with  Diabetic Retinopathy.  In both eyes.  Severity is moderate.  Duration of 4 weeks.  Since onset it is stable.  I, the attending physician,  performed the HPI with the patient and updated documentation appropriately.        Comments   Pt here for 4 wk ret f/u PDR OU. Pt states VA is stable. She did see Dr. June Leap who told her she had a film over lens OD and will have a YAG in February. Pt reports using Ats prn OU. Pt is currently on a prednisone pak for her back. A1C was 6.9 in Nov 2024.       Last edited by Rennis Chris, MD on 01/07/2024  1:06 PM.     Referring physician: [name], MD [address] [place],  [place] [address]  HISTORICAL INFORMATION:  Selected notes from the MEDICAL RECORD NUMBER Referred by Dr. June Leap for macular edema LEE:  Ocular Hx- PMH-   CURRENT MEDICATIONS: Current Outpatient Medications (Ophthalmic Drugs)  Medication Sig   Polyvinyl Alcohol-Povidone (REFRESH OP) Place 1 drop into both eyes daily as needed (dry eyes).   No current facility-administered medications for this visit. (Ophthalmic Drugs)   Current Outpatient Medications (Other)  Medication Sig   acetaminophen (TYLENOL) 325 MG tablet Take 650 mg by mouth every 6 (six) hours as needed for mild pain or fever.   albuterol (VENTOLIN HFA) 108 (90 Base) MCG/ACT inhaler Inhale 2 puffs into the lungs every 6 (six) hours as needed for wheezing or shortness of breath.   ALPRAZolam (XANAX) 0.5 MG tablet Take 0.5 mg by mouth at bedtime as needed for anxiety or sleep.   aspirin EC 81 MG tablet Take 1 tablet (81 mg total) by mouth daily. Swallow whole.   ceFAZolin (ANCEF) 2-4 GM/100ML-% IVPB Inject 100 mLs (2 g total) into the  vein every Monday, Wednesday, and Friday with hemodialysis. Last dose on 10/30/23 on dialysis   cetirizine (ZYRTEC) 10 MG tablet Take 1 tablet (10 mg total) by mouth daily.   Cholecalciferol (VITAMIN D-3 PO) Take 1 tablet by mouth daily.   cinacalcet (SENSIPAR) 30 MG tablet Take 30 mg by mouth daily.   docusate sodium (COLACE) 100 MG capsule Take 100 mg by mouth daily.   ferric citrate (AURYXIA) 1 GM 210 MG(Fe) tablet Take 420 mg by mouth 2 (two) times daily with a meal. With Breakfast & with supper   glipiZIDE (GLUCOTROL) 5 MG tablet Take 1 tablet (5 mg total) by mouth daily before breakfast.   ibuprofen (ADVIL) 800 MG tablet Take 800 mg by mouth every 8 (eight) hours as needed.   insulin degludec (TRESIBA FLEXTOUCH) 200 UNIT/ML FlexTouch Pen Inject 30 Units into the skin at bedtime.   ipratropium (ATROVENT) 0.06 % nasal spray Place 2 sprays into both nostrils daily as needed for rhinitis.   irbesartan (AVAPRO) 75 MG tablet Take 75 mg by mouth See admin instructions. Take 75 mg at night on non-dialysis days (Sunday, Tuesday, Thursday, Saturday)   labetalol (NORMODYNE) 100 MG tablet Take 100 mg by mouth 2 (two) times daily.   linaclotide (LINZESS) 290 MCG CAPS capsule Take 290 mcg by mouth daily before breakfast.  montelukast (SINGULAIR) 10 MG tablet Take 1 tablet (10 mg total) by mouth at bedtime.   multivitamin (RENA-VIT) TABS tablet Take 1 tablet by mouth daily.   NIFEdipine (ADALAT CC) 30 MG 24 hr tablet Take 30 mg by mouth See admin instructions. Take 30 mg at night on non-dialysis days (Sunday, Tuesday, Thursday, Saturday)   ondansetron (ZOFRAN-ODT) 4 MG disintegrating tablet Take 1 tablet (4 mg total) by mouth every 8 (eight) hours as needed for nausea or vomiting.   oxyCODONE (OXY IR/ROXICODONE) 5 MG immediate release tablet Take 1 tablet (5 mg total) by mouth every 6 (six) hours as needed for moderate pain.   OXYGEN Inhale 2 L into the lungs continuous.   pantoprazole (PROTONIX) 40 MG  tablet TAKE 1 TABLET BY MOUTH EVERY DAY 30 MINS BEFORE BREAKFAST   rOPINIRole (REQUIP XL) 2 MG 24 hr tablet Take 2 mg by mouth at bedtime.    rosuvastatin (CRESTOR) 20 MG tablet Take 20 mg by mouth daily.   sevelamer carbonate (RENVELA) 800 MG tablet Take 1,600-2,400 mg by mouth See admin instructions. Take 1600 mg at breakfast, 2400 mg at lunch and 1600 mg at dinner   Current Facility-Administered Medications (Other)  Medication Route   0.9 %  sodium chloride infusion Intravenous   sodium chloride flush (NS) 0.9 % injection 3 mL Intravenous   REVIEW OF SYSTEMS: ROS   Positive for: Endocrine, Eyes, Respiratory Negative for: Constitutional, Gastrointestinal, Neurological, Skin, Genitourinary, Musculoskeletal, HENT, Cardiovascular, Psychiatric, Allergic/Imm, Heme/Lymph Last edited by Thompson Grayer, COT on 01/07/2024  8:22 AM.     ALLERGIES Allergies  Allergen Reactions   Norvasc [Amlodipine Besylate] Rash and Other (See Comments)    Dizziness    Reglan [Metoclopramide] Other (See Comments)    Hallucinations    PAST MEDICAL HISTORY Past Medical History:  Diagnosis Date   Anemia    Ankle fracture    Arthritis    Asthma    Blood transfusion without reported diagnosis    Breast cancer (HCC)    Cancer (HCC)    COVID    Diabetes mellitus without complication (HCC)    Dyspnea    End stage renal disease on dialysis (HCC)    M/W/F [facility] in [place]   GERD (gastroesophageal reflux disease)    Hypertension    OSA (obstructive sleep apnea)    uses CPAP sometimes   Pneumonia    PONV (postoperative nausea and vomiting)    Wears glasses    Past Surgical History:  Procedure Laterality Date   A/V FISTULAGRAM Left 08/13/2023   Procedure: A/V Fistulagram;  Surgeon: Nada Libman, MD;  Location: [facility] IN[facility]AS[facility];  Service: Cardiovascular;  Laterality: Left;   ABDOMINAL HYSTERECTOMY     APPLICATION OF WOUND VAC Left 05/26/2023   Procedure: APPLICATION OF WOUND VAC;  Surgeon:  Victorino Sparrow, MD;  Location: [facility] OR;  Service: Vascular;  Laterality: Left;   AV FISTULA PLACEMENT  11/2014   at [facility] hospital   AV FISTULA REPAIR N/A 04/2021   BALLOON DILATION N/A 07/10/2016   Procedure: BALLOON DILATION;  Surgeon: West Bali, MD;  Location: [facility];  Service: Endoscopy;  Laterality: N/A;  Pyloric dilation   BREAST LUMPECTOMY     CATARACT EXTRACTION W/PHACO Right 05/24/2023   Procedure: CATARACT EXTRACTION PHACO AND INTRAOCULAR LENS PLACEMENT (IOC);  Surgeon: [name], MD;  Location: [facility] ORS;  Service: Ophthalmology;  Laterality: Right;  CDE: 5.00   CESAREAN SECTION     CHOLECYSTECTOMY  COLONOSCOPY  02/2023   COLONOSCOPY WITH PROPOFOL N/A 09/27/2016   Dr. Jena Gauss: Internal hemorrhoids repeat colonoscopy in 10 years   COLONOSCOPY WITH PROPOFOL N/A 02/28/2023   Procedure: COLONOSCOPY WITH PROPOFOL;  Surgeon: Corbin Ade, MD;  Location: [facility];  Service: Endoscopy;  Laterality: N/A;  11:45 am, asa 3 dialysis pt   DIALYSIS/PERMA CATHETER INSERTION N/A 11/15/2023   Procedure: DIALYSIS/PERMA CATHETER INSERTION;  Surgeon: Nada Libman, MD;  Location: [facility] INVASIVE CV LAB;  Service: Cardiovascular;  Laterality: N/A;   DILATION AND CURETTAGE OF UTERUS     ESOPHAGOGASTRODUODENOSCOPY N/A 07/10/2016   Dr.Fields- normal esophagus, gastric stenosis was found at the pylorus, gastritis on bx, normal examined duodenun   ESOPHAGOGASTRODUODENOSCOPY (EGD) WITH PROPOFOL N/A 11/21/2022   Procedure: ESOPHAGOGASTRODUODENOSCOPY (EGD) WITH PROPOFOL;  Surgeon: Dolores Frame, MD;  [facility]: [facility];  Service: Gastroenterology;  Laterality: N/A;   EXTERNAL FIXATION REMOVAL Right 10/29/2018   Procedure: REMOVAL RIGHT ANKLE BIOMET ZIMMER EXTERNAL FIXATOR, SHORT LEG CAST APPLICATION;  Surgeon: Eldred Manges, MD;  Location: [facility] OR;  Service: Orthopedics;  Laterality: Right;   INSERTION OF DIALYSIS CATHETER  05/26/2023   Procedure: INSERTION OF Left internal  jugular DIALYSIS CATHETER;  Surgeon: Victorino Sparrow, MD;  Location: [facility] OR;  Service: Vascular;;   IR FLUORO GUIDE CV LINE LEFT  08/28/2023   IR FLUORO GUIDE CV LINE RIGHT  09/24/2023   IR PATIENT EVAL TECH 0-60 MINS  09/24/2023   IR US GUIDE VASC ACCESS LEFT  08/28/2023   IR US GUIDE VASC ACCESS RIGHT  09/24/2023   MASTECTOMY     left sided   ORIF ANKLE FRACTURE Right 10/06/2018   Procedure: OPEN REDUCTION INTERNAL FIXATION (ORIF) RIGHT ANKLE TRIMALLEOLAR;  Surgeon: Eldred Manges, MD;  Location: [facility] OR;  Service: Orthopedics;  Laterality: Right;   PERIPHERAL VASCULAR BALLOON ANGIOPLASTY  08/13/2023   Procedure: PERIPHERAL VASCULAR BALLOON ANGIOPLASTY;  Surgeon: Nada Libman, MD;  Location: [facility];  Service: Cardiovascular;;   REVISON OF ARTERIOVENOUS FISTULA Left 05/26/2023   Procedure: REVISON OF ARTERIOVENOUS FISTULA LEFT ARM HEMATOMA WASH OUT;  Surgeon: Victorino Sparrow, MD;  Location: [facility] OR;  Service: Vascular;  Laterality: Left;   RIGHT/LEFT HEART CATH AND CORONARY ANGIOGRAPHY N/A 11/27/2022   Procedure: RIGHT/LEFT HEART CATH AND CORONARY ANGIOGRAPHY;  Surgeon: Swaziland, Peter M, MD;  Location: [facility] INVASIVE CV LAB;  Service: Cardiovascular;  Laterality: N/A;   TEE WITHOUT CARDIOVERSION N/A 09/18/2023   Procedure: TRANSESOPHAGEAL ECHOCARDIOGRAM (TEE);  Surgeon: Jonelle Sidle, MD;  Location: [facility];  Service: Cardiovascular;  Laterality: N/A;   FAMILY HISTORY Family History  Problem Relation Age of Onset   Diabetes Mellitus II Mother    Hypertension Mother    Heart block Mother    Hypertension Sister    Hypertension Sister    Colon cancer Neg Hx    SOCIAL HISTORY Social History   Tobacco Use   Smoking status: Never   Smokeless tobacco: Never  Vaping Use   Vaping status: Never Used  Substance Use Topics   Alcohol use: No    Alcohol/week: 0.0 standard drinks of alcohol   Drug use: No       OPHTHALMIC EXAM:  Base Eye Exam     Visual Acuity (Snellen - Linear)        Right Left   Dist [place] 20/80 -2 20/80 -2   Dist ph [place] 20/60 -1 20/40  Tonometry (Tonopen, 8:32 AM)       Right Left   Pressure 18 16         Pupils       Pupils Dark Light Shape React APD   Right PERRL 3 2 Round Brisk None   Left PERRL 3 2 Round Brisk None         Visual Fields (Counting fingers)       Left Right    Full Full         Extraocular Movement       Right Left    Full, Ortho Full, Ortho         Neuro/Psych     Oriented x3: Yes   Mood/Affect: Normal         Dilation     Both eyes: 1.0% Mydriacyl, 2.5% Phenylephrine @ 8:33 AM           Slit Lamp and Fundus Exam     Slit Lamp Exam       Right Left   Lids/Lashes Dermatochalasis - upper lid Dermatochalasis - upper lid   Conjunctiva/Sclera mild melanosis mild melanosis   Cornea mild arcus, well healed cataract wound mild arcus, 1+ Punctate epithelial erosions   Anterior Chamber deep and clear deep and clear   Iris Round and reactive, No NVI Round and dilated, No NVI   Lens PC IOL in good position 2-3+ Nuclear sclerosis, 2-3+ Cortical cataract   Anterior Vitreous mild syneresis, old white VH settled inferiorly, scattered vitreous condensations, Posterior vitreous detachment mild syneresis, scattered fibrosis         Fundus Exam       Right Left   Disc Pink and Sharp, no NVD mild Pallor, Sharp rim, no NVD   C/D Ratio 0.2 0.3   Macula Blunted foveal reflex, scattered MA, focal pigment clump temporal fovea, trace cystic changes Flat, Blunted foveal reflex, central edema and exudates, scattered MA   Vessels attenuated, Tortuous attenuated, mild tortuosity   Periphery Attached, scattered PRP 360 with good fill in changes, scattered MA/DBH, peripheral schisis IT with old, white VH overlying Attached, 360 peripheral PRP with good fill in changes, scattered patches of pre-retinal heme turning white           IMAGING AND PROCEDURES  Imaging and Procedures for  01/07/2024  OCT, Retina - OU - Both Eyes       Right Eye Quality was good. Central Foveal Thickness: 255. Progression has been stable. Findings include normal foveal contour, no SRF, intraretinal hyper-reflective material, intraretinal fluid (Focal IRHM and trace cystic changes temporal fovea and macula; IT schisis - stable, +vitreous opacities -- persistent, partial PVD).   Left Eye Quality was good. Central Foveal Thickness: 381. Progression has improved. Findings include no SRF, abnormal foveal contour, intraretinal hyper-reflective material, intraretinal fluid, vitreomacular adhesion (Mild interval improvement in IRF/IRHM/edema temporal fovea and macula; IN schisis not imaged today).   Notes *Images captured and stored on drive  Diagnosis / Impression:  +DME OU + peripheral retinoschisis OU OD: Focal IRHM and trace cystic changes temporal fovea and macula; IT schisis - stable, +vitreous opacities -- persistent, partial PVD  OS: Mild interval improvement in IRF/IRHM/edema temporal fovea and macula; IN schisis caught on widefield - stable  Clinical management:  See below  Abbreviations: NFP - Normal foveal profile. CME - cystoid macular edema. PED - pigment epithelial detachment. IRF - intraretinal fluid. SRF - subretinal fluid. EZ - ellipsoid zone. ERM - epiretinal membrane. ORA -  outer retinal atrophy. ORT - outer retinal tubulation. SRHM - subretinal hyper-reflective material. IRHM - intraretinal hyper-reflective material      Intravitreal Injection, Pharmacologic Agent - OD - Right Eye       Time Out 01/07/2024. 9:45 AM. Confirmed correct patient, procedure, site, and patient consented.   Anesthesia Topical anesthesia was used. Anesthetic medications included Lidocaine 2%, Proparacaine 0.5%.   Procedure Preparation included 5% betadine to ocular surface, eyelid speculum. A (32g) needle was used.   Injection: 2 mg aflibercept 2 MG/0.05ML   Route: Intravitreal, Site: Right  Eye   NDC: L6038910, Lot: [id number], Expiration date: 10/16/2024, Waste: 0 mL   Post-op Post injection exam found visual acuity of at least counting fingers. The patient tolerated the procedure well. There were no complications. The patient received written and verbal post procedure care education. Post injection medications were not given.      Intravitreal Injection, Pharmacologic Agent - OS - Left Eye       Time Out 01/07/2024. 9:45 AM. Confirmed correct patient, procedure, site, and patient consented.   Anesthesia Topical anesthesia was used. Anesthetic medications included Lidocaine 2%, Proparacaine 0.5%.   Procedure Preparation included 5% betadine to ocular surface, eyelid speculum. A (32g) needle was used.   Injection: 2 mg aflibercept 2 MG/0.05ML   Route: Intravitreal, Site: Left Eye   NDC: L6038910, Lot: [id number], Expiration date: 01/16/2025, Waste: 0 mL   Post-op Post injection exam found visual acuity of at least counting fingers. The patient tolerated the procedure well. There were no complications. The patient received written and verbal post procedure care education.           ASSESSMENT/PLAN:   ICD-10-CM   1. Proliferative diabetic retinopathy of both eyes with macular edema associated with type 2 diabetes mellitus (HCC)  E11.3513 OCT, Retina - OU - Both Eyes    Intravitreal Injection, Pharmacologic Agent - OD - Right Eye    Intravitreal Injection, Pharmacologic Agent - OS - Left Eye    aflibercept (EYLEA) SOLN 2 mg    aflibercept (EYLEA) SOLN 2 mg    2. 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 Essential hypertension  I10     5. Hypertensive retinopathy of both eyes  H35.033     6. Pseudophakia  Z96.1     7. Combined forms of age-related cataract of left eye  H25.812      1,2. Proliferative diabetic retinopathy, both eyes  - A1C 6.9 (11.27.24)  - delayed f/u from 4 weeks to 9 weeks  (08.20.24-10.22.24) due to being in the hospital  - s/p IVA OS #1 (05.14.24), IVA OD #1 (05.16.24) - s/p IVA OU # 2 (06.25.24), #3 (07.23.24), #4 (08.20.24), #5 (10.22.24), #6 (11.19.24), #7 (12.17.24)  - s/p PRP OS (05.21.24)  - s/p PRP OD (08.01.24)  - previously followed with Dr. Dorette Grate at [facility], but had been lost to f/u since 10.28.2020  - h/o PRP OU; had never received anti-VEGF injxns prior to here, per report - exam shows old white VH + some red subhyaloid heme OD; OS with focal clusters of preretinal heme turning white -- nasal and temporal periphery - FA (05.14.24) shows OD: Clusters of perivascular leakage inferior and temporal midzone -- ?early NV; OS: Clusters of NVE temporal midzone -- pt would benefit from fill in PRP OU (OS first--done 05.21.24) - OCT shows VQ:QVZDG IRHM and trace cystic changes temporal fovea and macula;  IT schisis - stable, +vitreous opacities --persistent, partial PVD; OS: Mild interval improvement in IRF/IRHM/edema temporal fovea and macula; IN schisis caught on widefield -- stable at 5 weeks **discussed decreased efficacy / resistance to Avastin and potential benefit of switching medication**  - recommend IVE OU #1 today 1.21.25 w/ f/u in 4 wks - pt wishes to proceed with injections - RBA of procedure discussed, questions answered - Avastin informed consent obtained and signed 05.14.24 (OU) - Eylea informed consent obtained and signed 01.21.25 (OU) - see procedure note - Eylea approved for 2025 - f/u 4 weeks, DFE, OCT, FA, possible injection(s)   3. Retinoschisis OU  - peripheral retinoschisis confirmed by OCT  - OD -- inferotemporal periphery  - OS -- nasal periphery  - stable, monitor  4,5. Hypertensive retinopathy OU - discussed importance of tight BP control - monitor  6. Pseudophakia OD  - s/p CE/IOL OD (Dr. June Leap, 06.07.24)  - IOL in good position  - Dr. June Leap planning on YAG cap OD  - monitor  7. Mixed Cataract OS - The symptoms of  cataract, surgical options, and treatments and risks were discussed with patient. - discussed diagnosis and progression - under the expert management of Dr. June Leap - clear from a retina standpoint to proceed with cataract surgery when pt and surgeon are ready  Ophthalmic Meds Ordered this visit:  Meds ordered this encounter  Medications   aflibercept (EYLEA) SOLN 2 mg   aflibercept (EYLEA) SOLN 2 mg     Return in about 4 weeks (around 02/04/2024) for f/u PDR OU, DFE, OCT, FA, Possible, IVE, OU.  There are no Patient Instructions on file for this visit.  Explained the diagnoses, plan, and follow up with the patient and they expressed understanding.  Patient expressed understanding of the importance of proper follow up care.   This document serves as a record of services personally performed by Karie Chimera, MD, PhD. It was created on their behalf by Charlette Caffey, COT an ophthalmic technician. The creation of this record is the provider's dictation and/or activities during the visit.    Electronically signed by:  Charlette Caffey, COT  01/10/24 4:13 PM  Karie Chimera, M.D., Ph.D. Diseases & Surgery of the Retina and Vitreous [facility]  I have reviewed the above documentation for accuracy and completeness, and I agree with the above. Karie Chimera, M.D., Ph.D. 01/10/24 4:16 PM   Abbreviations: M myopia (nearsighted); A astigmatism; H hyperopia (farsighted); P presbyopia; Mrx spectacle prescription;  CTL contact lenses; OD right eye; OS left eye; OU both eyes  XT exotropia; ET esotropia; PEK punctate epithelial keratitis; PEE punctate epithelial erosions; DES dry eye syndrome; MGD meibomian gland dysfunction; ATs artificial tears; PFAT's preservative free artificial tears; NSC nuclear sclerotic cataract; PSC posterior subcapsular cataract; ERM epi-retinal membrane; PVD posterior vitreous detachment; RD retinal detachment; DM diabetes mellitus; DR  diabetic retinopathy; NPDR non-proliferative diabetic retinopathy; PDR proliferative diabetic retinopathy; CSME clinically significant macular edema; DME diabetic macular edema; dbh dot blot hemorrhages; CWS cotton wool spot; POAG primary open angle glaucoma; C/D cup-to-disc ratio; HVF humphrey visual field; GVF goldmann visual field; OCT optical coherence tomography; IOP intraocular pressure; BRVO Branch retinal vein occlusion; CRVO central retinal vein occlusion; CRAO central retinal artery occlusion; BRAO branch retinal artery occlusion; RT retinal tear; SB scleral buckle; PPV pars plana vitrectomy; VH Vitreous hemorrhage; PRP panretinal laser photocoagulation; IVK intravitreal kenalog; VMT vitreomacular traction; MH Macular hole;  NVD neovascularization  of the disc; NVE neovascularization elsewhere; AREDS age related eye disease study; ARMD age related macular degeneration; POAG primary open angle glaucoma; EBMD epithelial/anterior basement membrane dystrophy; ACIOL anterior chamber intraocular lens; IOL intraocular lens; PCIOL posterior chamber intraocular lens; Phaco/IOL phacoemulsification with intraocular lens placement; PRK photorefractive keratectomy; LASIK laser assisted in situ keratomileusis; HTN hypertension; DM diabetes mellitus; COPD chronic obstructive pulmonary disease

## 2023-12-25 DIAGNOSIS — Z992 Dependence on renal dialysis: Secondary | ICD-10-CM | POA: Diagnosis not present

## 2023-12-25 DIAGNOSIS — N186 End stage renal disease: Secondary | ICD-10-CM | POA: Diagnosis not present

## 2023-12-25 DIAGNOSIS — N250 Renal osteodystrophy: Secondary | ICD-10-CM | POA: Diagnosis not present

## 2023-12-25 DIAGNOSIS — D631 Anemia in chronic kidney disease: Secondary | ICD-10-CM | POA: Diagnosis not present

## 2023-12-25 DIAGNOSIS — N2581 Secondary hyperparathyroidism of renal origin: Secondary | ICD-10-CM | POA: Diagnosis not present

## 2023-12-25 DIAGNOSIS — D509 Iron deficiency anemia, unspecified: Secondary | ICD-10-CM | POA: Diagnosis not present

## 2023-12-27 ENCOUNTER — Other Ambulatory Visit: Payer: Self-pay | Admitting: "Endocrinology

## 2023-12-27 DIAGNOSIS — Z6841 Body Mass Index (BMI) 40.0 and over, adult: Secondary | ICD-10-CM | POA: Diagnosis not present

## 2023-12-27 DIAGNOSIS — E1122 Type 2 diabetes mellitus with diabetic chronic kidney disease: Secondary | ICD-10-CM

## 2023-12-27 DIAGNOSIS — N186 End stage renal disease: Secondary | ICD-10-CM | POA: Diagnosis not present

## 2023-12-27 DIAGNOSIS — N2581 Secondary hyperparathyroidism of renal origin: Secondary | ICD-10-CM | POA: Diagnosis not present

## 2023-12-27 DIAGNOSIS — M5432 Sciatica, left side: Secondary | ICD-10-CM | POA: Diagnosis not present

## 2023-12-27 DIAGNOSIS — N250 Renal osteodystrophy: Secondary | ICD-10-CM | POA: Diagnosis not present

## 2023-12-27 DIAGNOSIS — I132 Hypertensive heart and chronic kidney disease with heart failure and with stage 5 chronic kidney disease, or end stage renal disease: Secondary | ICD-10-CM | POA: Diagnosis not present

## 2023-12-27 DIAGNOSIS — R0902 Hypoxemia: Secondary | ICD-10-CM | POA: Diagnosis not present

## 2023-12-27 DIAGNOSIS — Z992 Dependence on renal dialysis: Secondary | ICD-10-CM | POA: Diagnosis not present

## 2023-12-27 DIAGNOSIS — S81801D Unspecified open wound, right lower leg, subsequent encounter: Secondary | ICD-10-CM | POA: Diagnosis not present

## 2023-12-27 DIAGNOSIS — D509 Iron deficiency anemia, unspecified: Secondary | ICD-10-CM | POA: Diagnosis not present

## 2023-12-27 DIAGNOSIS — D631 Anemia in chronic kidney disease: Secondary | ICD-10-CM | POA: Diagnosis not present

## 2023-12-30 DIAGNOSIS — N250 Renal osteodystrophy: Secondary | ICD-10-CM | POA: Diagnosis not present

## 2023-12-30 DIAGNOSIS — N2581 Secondary hyperparathyroidism of renal origin: Secondary | ICD-10-CM | POA: Diagnosis not present

## 2023-12-30 DIAGNOSIS — N186 End stage renal disease: Secondary | ICD-10-CM | POA: Diagnosis not present

## 2023-12-30 DIAGNOSIS — D509 Iron deficiency anemia, unspecified: Secondary | ICD-10-CM | POA: Diagnosis not present

## 2023-12-30 DIAGNOSIS — D631 Anemia in chronic kidney disease: Secondary | ICD-10-CM | POA: Diagnosis not present

## 2023-12-30 DIAGNOSIS — Z992 Dependence on renal dialysis: Secondary | ICD-10-CM | POA: Diagnosis not present

## 2023-12-31 ENCOUNTER — Ambulatory Visit: Payer: Medicare Other | Admitting: "Endocrinology

## 2023-12-31 ENCOUNTER — Encounter (INDEPENDENT_AMBULATORY_CARE_PROVIDER_SITE_OTHER): Payer: Medicare Other | Admitting: Ophthalmology

## 2023-12-31 DIAGNOSIS — Z961 Presence of intraocular lens: Secondary | ICD-10-CM

## 2023-12-31 DIAGNOSIS — Z794 Long term (current) use of insulin: Secondary | ICD-10-CM

## 2023-12-31 DIAGNOSIS — H33103 Unspecified retinoschisis, bilateral: Secondary | ICD-10-CM

## 2023-12-31 DIAGNOSIS — H25812 Combined forms of age-related cataract, left eye: Secondary | ICD-10-CM

## 2023-12-31 DIAGNOSIS — H35033 Hypertensive retinopathy, bilateral: Secondary | ICD-10-CM

## 2023-12-31 DIAGNOSIS — E113513 Type 2 diabetes mellitus with proliferative diabetic retinopathy with macular edema, bilateral: Secondary | ICD-10-CM

## 2023-12-31 DIAGNOSIS — I10 Essential (primary) hypertension: Secondary | ICD-10-CM

## 2024-01-01 DIAGNOSIS — N186 End stage renal disease: Secondary | ICD-10-CM | POA: Diagnosis not present

## 2024-01-01 DIAGNOSIS — Z992 Dependence on renal dialysis: Secondary | ICD-10-CM | POA: Diagnosis not present

## 2024-01-01 DIAGNOSIS — N2581 Secondary hyperparathyroidism of renal origin: Secondary | ICD-10-CM | POA: Diagnosis not present

## 2024-01-01 DIAGNOSIS — N250 Renal osteodystrophy: Secondary | ICD-10-CM | POA: Diagnosis not present

## 2024-01-01 DIAGNOSIS — D631 Anemia in chronic kidney disease: Secondary | ICD-10-CM | POA: Diagnosis not present

## 2024-01-01 DIAGNOSIS — D509 Iron deficiency anemia, unspecified: Secondary | ICD-10-CM | POA: Diagnosis not present

## 2024-01-02 ENCOUNTER — Ambulatory Visit (HOSPITAL_COMMUNITY): Payer: Medicare Other | Attending: Family Medicine | Admitting: Physical Therapy

## 2024-01-02 ENCOUNTER — Other Ambulatory Visit: Payer: Self-pay

## 2024-01-02 DIAGNOSIS — M79661 Pain in right lower leg: Secondary | ICD-10-CM | POA: Diagnosis not present

## 2024-01-02 DIAGNOSIS — S81801A Unspecified open wound, right lower leg, initial encounter: Secondary | ICD-10-CM | POA: Diagnosis not present

## 2024-01-02 NOTE — Therapy (Addendum)
OUTPATIENT PHYSICAL THERAPY Wound  EVALUATION   Patient Name: Patricia [name] MRN: [id number] DOB:Jun 09, 1971, 53 y.o., female Today's Date: 01/02/2024   PCP: Valentino Hue  REFERRING PROVIDER: [name], PA-C  END OF SESSION:  PT End of Session - 01/02/24 1438     Visit Number 1    Number of Visits 8    Date for PT Re-Evaluation 02/01/24    Authorization Type medicare    PT Start Time 1345    PT Stop Time 1420    PT Time Calculation (min) 35 min    Activity Tolerance Patient tolerated treatment well    Behavior During Therapy WFL for tasks assessed/performed             Past Medical History:  Diagnosis Date   Anemia    Ankle fracture    Arthritis    Asthma    Blood transfusion without reported diagnosis    Breast cancer (HCC)    Cancer (HCC)    COVID    Diabetes mellitus without complication (HCC)    Dyspnea    End stage renal disease on dialysis (HCC)    M/W/F [facility] in [place]   GERD (gastroesophageal reflux disease)    Hypertension    OSA (obstructive sleep apnea)    uses CPAP sometimes   Pneumonia    PONV (postoperative nausea and vomiting)    Wears glasses    Past Surgical History:  Procedure Laterality Date   A/V FISTULAGRAM Left 08/13/2023   Procedure: A/V Fistulagram;  Surgeon: Nada Libman, MD;  Location: [facility];  Service: Cardiovascular;  Laterality: Left;   ABDOMINAL HYSTERECTOMY     APPLICATION OF WOUND VAC Left 05/26/2023   Procedure: APPLICATION OF WOUND VAC;  Surgeon: Victorino Sparrow, MD;  Location: [facility] OR;  Service: Vascular;  Laterality: Left;   AV FISTULA PLACEMENT  11/2014   at [facility]   AV FISTULA REPAIR N/A 04/2021   BALLOON DILATION N/A 07/10/2016   Procedure: BALLOON DILATION;  Surgeon: West Bali, MD;  Location: [facility];  Service: Endoscopy;  Laterality: N/A;  Pyloric dilation   BREAST LUMPECTOMY     CATARACT EXTRACTION W/PHACO Right 05/24/2023   Procedure: CATARACT EXTRACTION PHACO AND  INTRAOCULAR LENS PLACEMENT (IOC);  Surgeon: [name], MD;  Location: [facility] ORS;  Service: Ophthalmology;  Laterality: Right;  CDE: 5.00   CESAREAN SECTION     CHOLECYSTECTOMY     COLONOSCOPY  02/2023   COLONOSCOPY WITH PROPOFOL N/A 09/27/2016   Dr. Jena Gauss: Internal hemorrhoids repeat colonoscopy in 10 years   COLONOSCOPY WITH PROPOFOL N/A 02/28/2023   Procedure: COLONOSCOPY WITH PROPOFOL;  Surgeon: Corbin Ade, MD;  Location: [facility];  Service: Endoscopy;  Laterality: N/A;  11:45 am, asa 3 dialysis pt   DIALYSIS/PERMA CATHETER INSERTION N/A 11/15/2023   Procedure: DIALYSIS/PERMA CATHETER INSERTION;  Surgeon: Nada Libman, MD;  Location: [facility] INVASIVE CV LAB;  Service: Cardiovascular;  Laterality: N/A;   DILATION AND CURETTAGE OF UTERUS     ESOPHAGOGASTRODUODENOSCOPY N/A 07/10/2016   Dr.Fields- normal esophagus, gastric stenosis was found at the pylorus, gastritis on bx, normal examined duodenun   ESOPHAGOGASTRODUODENOSCOPY (EGD) WITH PROPOFOL N/A 11/21/2022   Procedure: ESOPHAGOGASTRODUODENOSCOPY (EGD) WITH PROPOFOL;  Surgeon: Dolores Frame, MD;  [facility]: [facility];  Service: Gastroenterology;  Laterality: N/A;   EXTERNAL FIXATION REMOVAL Right 10/29/2018   Procedure: REMOVAL RIGHT ANKLE BIOMET ZIMMER EXTERNAL FIXATOR, SHORT LEG CAST APPLICATION;  Surgeon: Annell Greening  C, MD;  Location: [facility] OR;  Service: Orthopedics;  Laterality: Right;   INSERTION OF DIALYSIS CATHETER  05/26/2023   Procedure: INSERTION OF Left internal jugular DIALYSIS CATHETER;  Surgeon: Victorino Sparrow, MD;  Location: [facility] OR;  Service: Vascular;;   IR FLUORO GUIDE CV LINE LEFT  08/28/2023   IR FLUORO GUIDE CV LINE RIGHT  09/24/2023   IR PATIENT EVAL TECH 0-60 MINS  09/24/2023   IR US GUIDE VASC ACCESS LEFT  08/28/2023   IR US GUIDE VASC ACCESS RIGHT  09/24/2023   MASTECTOMY     left sided   ORIF ANKLE FRACTURE Right 10/06/2018   Procedure: OPEN REDUCTION INTERNAL FIXATION (ORIF) RIGHT ANKLE  TRIMALLEOLAR;  Surgeon: Eldred Manges, MD;  Location: [facility] OR;  Service: Orthopedics;  Laterality: Right;   PERIPHERAL VASCULAR BALLOON ANGIOPLASTY  08/13/2023   Procedure: PERIPHERAL VASCULAR BALLOON ANGIOPLASTY;  Surgeon: Nada Libman, MD;  Location: [facility];  Service: Cardiovascular;;   REVISON OF ARTERIOVENOUS FISTULA Left 05/26/2023   Procedure: REVISON OF ARTERIOVENOUS FISTULA LEFT ARM HEMATOMA WASH OUT;  Surgeon: Victorino Sparrow, MD;  Location: [facility] OR;  Service: Vascular;  Laterality: Left;   RIGHT/LEFT HEART CATH AND CORONARY ANGIOGRAPHY N/A 11/27/2022   Procedure: RIGHT/LEFT HEART CATH AND CORONARY ANGIOGRAPHY;  Surgeon: Swazil[place], Peter M, MD;  Location: [facility] INVAS[facility] CV LAB;  Service: Cardiovascular;  Laterality: N/A;   TEE WITHOUT CARDIOVERSION N/A 09/18/2023   Procedure: TRANSESOPHAGEAL ECHOCARDIOGRAM (TEE);  Surgeon: Jonelle Sidle, MD;  Location: [facility];  Service: Cardiovascular;  Laterality: N/A;   Patient Active Problem List   Diagnosis Date Noted   Staphylococcus aureus bacteremia 09/18/2023   Bacteremia 09/16/2023   Respiratory failure (HCC) 09/14/2023   Severe sepsis (HCC) 09/14/2023   Type 2 diabetes mellitus with hyperlipidemia (HCC) 09/14/2023   Acute anemia 06/03/2023   Complication of AV dialysis fistula 05/26/2023   Anemia of chronic disease 05/26/2023   Mixed hyperlipidemia 03/23/2023   Normocytic anemia 02/12/2023   Abdominal pain 02/12/2023   Chronic respiratory failure with hypoxia (HCC) 01/15/2023   Other specified disorders of bone density and structure, other site 01/02/2023   Acute on chronic hypoxic respiratory failure (HCC) 12/10/2022   Precordial chest pain 11/26/2022   ESRD needing dialysis (HCC) 11/20/2022   Class 3 obesity 11/19/2022   BOOP (bronchiolitis obliterans with organizing pneumonia) (HCC) 11/19/2022   Bronchiolitis obliterans (HCC) 11/19/2022   Post-traumatic osteoarthritis, right ankle and foot 08/31/2022   Nodular goiter  05/24/2022   Elevated alkaline phosphatase level 05/24/2022   Right foot sprain 12/20/2020   Contusion of left shoulder 12/20/2020   Syncope 08/03/2020   Acute pulmonary edema (HCC) 01/11/2020   OSA (obstructive sleep apnea)    Closed right ankle fracture 10/06/2018   Closed displaced trimalleolar fracture of right ankle 10/06/2018   Acute colitis 02/26/2018   Hypoxemia 02/24/2018   Dyspnea 11/26/2017   Acute on chronic anemia 11/26/2017   Elevated troponin 11/26/2017   Hyperkalemia 09/29/2017   Acute on chronic respiratory failure with hypoxia (HCC) 09/28/2017   CAP (community acquired pneumonia) 09/26/2017   Volume overload 09/25/2017   Acute on chronic diastolic CHF (congestive heart failure) (HCC) 09/25/2017   Lactic acidosis 09/25/2017   Hypokalemia 09/25/2017   Breast cancer (HCC) 07/22/2017   SOB (shortness of breath)    Acute respiratory failure with hypoxia (HCC) 07/21/2017   Flatulence 10/25/2016   GERD (gastroesophageal reflux disease) 08/02/2016   Pyloric stenosis, acquired    Nausea with  vomiting    Generalized abdominal pain    Chest pain, rule out acute myocardial infarction 07/04/2016   Constipation 07/04/2016   Nausea 07/04/2016   Essential hypertension 07/04/2016   HCAP (healthcare-associated pneumonia) 12/04/2015   ESRD on dialysis (HCC) 12/04/2015   Diabetes mellitus with end-stage renal disease (HCC) 12/04/2015    ONSET DATE: 10/10/24  REFERRING DIAG:  Diagnosis  S81.801D (ICD-10-CM) - Unspecified open wound, right lower leg, subsequent encounter    THERAPY DIAG:  Diagnosis  S81.801D (ICD-10-CM) - Unspecified open wound, right lower leg, subsequent encounter    Rationale for Evaluation and Treatment: Rehabilitation     Wound Therapy - 01/02/24 0001     Subjective Pt states that her Port for dialysis got infected in October.  She was hospitalized for IV antibiotics and when she got home she had red dots appear on her legs and then they broke  out into sores.  The Left leg healed but the Right won't heal.  The pt has swelling in the Rt LE but pt states this is chronic from an ankle fx back in 2019.    Patient and Family Stated Goals wounds to heal    Date of Onset 10/10/24    Prior Treatments various ointments and antibiotics    Pain Scale 0-10    Pain Score 6    with therapist attempting debridement.  PT states posterior wounds are more painful than anterior wounds   Pain Type Acute pain    Pain Location Leg    Pain Orientation Right;Posterior;Lateral    Pain Descriptors / Indicators Sharp    Pain Onset Other (Comment)   with touch /debridement   Patients Stated Pain Goal 0    Pain Intervention(s) Emotional support    Evaluation and Treatment Procedures Explained to Patient/Family Yes    Evaluation and Treatment Procedures agreed to    Wound Properties Date First Assessed: 01/02/24 Time First Assessed: 1350 Wound Type: Other (Comment) Location: Leg Location Orientation: Right;Medial;Posterior Wound Description (Comments): multiple small wounds Present on Admission: Yes   Wound Image Images linked: 2    Dressing Type None    Dressing Changed New    Dressing Status None    Dressing Change Frequency PRN    Site / Wound Assessment Dusky;Brown    % Wound base Red or Granulating 95%    Peri-wound Assessment Edema   Pt states this is chronic from ankle fx in 2019   Wound Length (cm) --   multiple wounds on medial and posterior aspect of RT LE   Drainage Amount None    Treatment Cleansed;Debridement (Selective)    Selective Debridement (non-excisional) - Location wound bed    Selective Debridement (non-excisional) - Tools Used Forceps;Scalpel    Selective Debridement (non-excisional) - Tissue Removed very limited due to pain    Wound Therapy - Clinical Statement see below    Factors Delaying/Impairing Wound Healing Diabetes Mellitus;Multiple medical problems;Polypharmacy;Vascular compromise;Other (comment)   end stage kidney  failure.   Hydrotherapy Plan Debridement;Dressing change;Patient/family education    Wound Therapy - Frequency 2X / week   for 4 weeks   Wound Therapy - Current Recommendations PT    Wound Plan debridement and dressing change    Dressing  medihoney, 4x4, kerlix and coban               PATIENT EDUCATION: Education details: keep dressing dry , remove if it becomes painful Person educated: Patient Education method: Explanation Education comprehension: verbalized understanding  HOME EXERCISE PROGRAM: N/A   GOALS: Goals reviewed with patient? No  SHORT TERM GOALS: Target date: 01/18/24  PT wounds to be 100% granulated Baseline: Goal status: INITIAL  2.  Pt pain to be no greater than a 3/10 Baseline:  Goal status: INITIAL   [place] TERM GOALS: Target date: 02/01/24  Pt wound to be healed  Baseline:  Goal status: INITIAL  2.  PT to have no pain in her Right LE  Baseline:  Goal status: INITIAL   ASSESSMENT:  CLINICAL IMPRESSION: Patient is a 53 y.o. female who was seen today for physical therapy evaluation and treatment for multiple non healing wounds on pt Rt LE.  Pt states that her port became infected in October, she was admitted to the hospital for IV antibiotics.  She did not have anything on her legs but a day after she got home she went to the MD as she had red spots all over her legs which turned into wounds.   She has tried medication and has been on antibiotics.  This helped the Lt leg but the Rt leg continues to have multiple wounds on it.   Ms.  Wandling will benefit from skilled PT to create a more healing environment and debride her wounds to allow healing to occur.    OBJECTIVE IMPAIRMENTS: increased edema, pain, and decreased skin integrity .   ACTIVITY LIMITATIONS: bathing and dressing   PERSONAL FACTORS: Fitness, Time since onset of injury/illness/exacerbation, and 1-2 comorbidities: DM, end stage renal failure  are also affecting patient's functional  outcome.   REHAB POTENTIAL: Good  CLINICAL DECISION MAKING: Stable/uncomplicated  EVALUATION COMPLEXITY: Low  PLAN: PT FREQUENCY: 2x/week  PT DURATION: 4 weeks  PLANNED INTERVENTIONS: 97535- Self Care, 98119- Manual therapy, 97597- Wound care (first 20 sq cm), and Patient/Family education  PLAN FOR NEXT SESSION: keep moist warm cloth on wounds for five minutes prior to trying to debride.  Debride and dressing change   Virgina Organ, PT CLT [phone]  01/02/2024, 2:50 PM

## 2024-01-03 DIAGNOSIS — N186 End stage renal disease: Secondary | ICD-10-CM | POA: Diagnosis not present

## 2024-01-03 DIAGNOSIS — N2581 Secondary hyperparathyroidism of renal origin: Secondary | ICD-10-CM | POA: Diagnosis not present

## 2024-01-03 DIAGNOSIS — N250 Renal osteodystrophy: Secondary | ICD-10-CM | POA: Diagnosis not present

## 2024-01-03 DIAGNOSIS — Z992 Dependence on renal dialysis: Secondary | ICD-10-CM | POA: Diagnosis not present

## 2024-01-03 DIAGNOSIS — D631 Anemia in chronic kidney disease: Secondary | ICD-10-CM | POA: Diagnosis not present

## 2024-01-03 DIAGNOSIS — D509 Iron deficiency anemia, unspecified: Secondary | ICD-10-CM | POA: Diagnosis not present

## 2024-01-06 DIAGNOSIS — H43822 Vitreomacular adhesion, left eye: Secondary | ICD-10-CM | POA: Diagnosis not present

## 2024-01-06 DIAGNOSIS — N2581 Secondary hyperparathyroidism of renal origin: Secondary | ICD-10-CM | POA: Diagnosis not present

## 2024-01-06 DIAGNOSIS — N250 Renal osteodystrophy: Secondary | ICD-10-CM | POA: Diagnosis not present

## 2024-01-06 DIAGNOSIS — E113212 Type 2 diabetes mellitus with mild nonproliferative diabetic retinopathy with macular edema, left eye: Secondary | ICD-10-CM | POA: Diagnosis not present

## 2024-01-06 DIAGNOSIS — Z961 Presence of intraocular lens: Secondary | ICD-10-CM | POA: Diagnosis not present

## 2024-01-06 DIAGNOSIS — N186 End stage renal disease: Secondary | ICD-10-CM | POA: Diagnosis not present

## 2024-01-06 DIAGNOSIS — D509 Iron deficiency anemia, unspecified: Secondary | ICD-10-CM | POA: Diagnosis not present

## 2024-01-06 DIAGNOSIS — Z992 Dependence on renal dialysis: Secondary | ICD-10-CM | POA: Diagnosis not present

## 2024-01-06 DIAGNOSIS — H26491 Other secondary cataract, right eye: Secondary | ICD-10-CM | POA: Diagnosis not present

## 2024-01-06 DIAGNOSIS — D631 Anemia in chronic kidney disease: Secondary | ICD-10-CM | POA: Diagnosis not present

## 2024-01-06 DIAGNOSIS — H25812 Combined forms of age-related cataract, left eye: Secondary | ICD-10-CM | POA: Diagnosis not present

## 2024-01-07 ENCOUNTER — Encounter (INDEPENDENT_AMBULATORY_CARE_PROVIDER_SITE_OTHER): Payer: Self-pay | Admitting: Ophthalmology

## 2024-01-07 ENCOUNTER — Encounter (HOSPITAL_COMMUNITY): Payer: Self-pay

## 2024-01-07 ENCOUNTER — Ambulatory Visit (INDEPENDENT_AMBULATORY_CARE_PROVIDER_SITE_OTHER): Payer: Medicare Other | Admitting: Ophthalmology

## 2024-01-07 ENCOUNTER — Ambulatory Visit (HOSPITAL_COMMUNITY): Payer: Medicare Other

## 2024-01-07 DIAGNOSIS — Z794 Long term (current) use of insulin: Secondary | ICD-10-CM | POA: Diagnosis not present

## 2024-01-07 DIAGNOSIS — M79661 Pain in right lower leg: Secondary | ICD-10-CM

## 2024-01-07 DIAGNOSIS — S81801A Unspecified open wound, right lower leg, initial encounter: Secondary | ICD-10-CM

## 2024-01-07 DIAGNOSIS — H33103 Unspecified retinoschisis, bilateral: Secondary | ICD-10-CM

## 2024-01-07 DIAGNOSIS — H35033 Hypertensive retinopathy, bilateral: Secondary | ICD-10-CM | POA: Diagnosis not present

## 2024-01-07 DIAGNOSIS — H25812 Combined forms of age-related cataract, left eye: Secondary | ICD-10-CM | POA: Diagnosis not present

## 2024-01-07 DIAGNOSIS — E113513 Type 2 diabetes mellitus with proliferative diabetic retinopathy with macular edema, bilateral: Secondary | ICD-10-CM

## 2024-01-07 DIAGNOSIS — I10 Essential (primary) hypertension: Secondary | ICD-10-CM

## 2024-01-07 DIAGNOSIS — Z961 Presence of intraocular lens: Secondary | ICD-10-CM | POA: Diagnosis not present

## 2024-01-07 MED ORDER — AFLIBERCEPT 2MG/0.05ML IZ SOLN FOR KALEIDOSCOPE
2.0000 mg | INTRAVITREAL | Status: AC | PRN
  Administered 2024-01-07: 2 mg via INTRAVITREAL

## 2024-01-07 NOTE — Therapy (Signed)
OUTPATIENT PHYSICAL THERAPY NEURO TREATMENT   Patient Name: Patricia [name] MRN: [id number] DOB:11-30-1971, 53 y.o., female Today's Date: 01/07/2024   PCP: Valentino Hue  REFERRING PROVIDER: [name], PA-C  END OF SESSION:  PT End of Session - 01/07/24 1632     Visit Number 2    Number of Visits 8    Date for PT Re-Evaluation 02/01/24    Authorization Type medicare    PT Start Time 1530    PT Stop Time 1620    PT Time Calculation (min) 50 min    Activity Tolerance Patient tolerated treatment well    Behavior During Therapy WFL for tasks assessed/performed             Past Medical History:  Diagnosis Date   Anemia    Ankle fracture    Arthritis    Asthma    Blood transfusion without reported diagnosis    Breast cancer (HCC)    Cancer (HCC)    COVID    Diabetes mellitus without complication (HCC)    Dyspnea    End stage renal disease on dialysis (HCC)    M/W/F [facility] in [place]   GERD (gastroesophageal reflux disease)    Hypertension    OSA (obstructive sleep apnea)    uses CPAP sometimes   Pneumonia    PONV (postoperative nausea and vomiting)    Wears glasses    Past Surgical History:  Procedure Laterality Date   A/V FISTULAGRAM Left 08/13/2023   Procedure: A/V Fistulagram;  Surgeon: Nada Libman, MD;  Location: [facility];  Service: Cardiovascular;  Laterality: Left;   ABDOMINAL HYSTERECTOMY     APPLICATION OF WOUND VAC Left 05/26/2023   Procedure: APPLICATION OF WOUND VAC;  Surgeon: Victorino Sparrow, MD;  Location: [facility] OR;  Service: Vascular;  Laterality: Left;   AV FISTULA PLACEMENT  11/2014   at [facility] hospital   AV FISTULA REPAIR N/A 04/2021   BALLOON DILATION N/A 07/10/2016   Procedure: BALLOON DILATION;  Surgeon: West Bali, MD;  Location: [facility];  Service: Endoscopy;  Laterality: N/A;  Pyloric dilation   BREAST LUMPECTOMY     CATARACT EXTRACTION W/PHACO Right 05/24/2023   Procedure: CATARACT EXTRACTION PHACO AND  INTRAOCULAR LENS PLACEMENT (IOC);  Surgeon: [name], MD;  Location: [facility];  Service: Ophthalmology;  Laterality: Right;  CDE: 5.00   CESAREAN SECTION     CHOLECYSTECTOMY     COLONOSCOPY  02/2023   COLONOSCOPY WITH PROPOFOL N/A 09/27/2016   Dr. Jena Gauss: Internal hemorrhoids repeat colonoscopy in 10 years   COLONOSCOPY WITH PROPOFOL N/A 02/28/2023   Procedure: COLONOSCOPY WITH PROPOFOL;  Surgeon: Corbin Ade, MD;  Location: [facility];  Service: Endoscopy;  Laterality: N/A;  11:45 am, asa 3 dialysis pt   DIALYSIS/PERMA CATHETER INSERTION N/A 11/15/2023   Procedure: DIALYSIS/PERMA CATHETER INSERTION;  Surgeon: Nada Libman, MD;  Location: [facility] INVASIVE CV LAB;  Service: Cardiovascular;  Laterality: N/A;   DILATION AND CURETTAGE OF UTERUS     ESOPHAGOGASTRODUODENOSCOPY N/A 07/10/2016   Dr.Fields- normal esophagus, gastric stenosis was found at the pylorus, gastritis on bx, normal examined duodenun   ESOPHAGOGASTRODUODENOSCOPY (EGD) WITH PROPOFOL N/A 11/21/2022   Procedure: ESOPHAGOGASTRODUODENOSCOPY (EGD) WITH PROPOFOL;  Surgeon: Dolores Frame, MD;  [facility]: [facility];  Service: Gastroenterology;  Laterality: N/A;   EXTERNAL FIXATION REMOVAL Right 10/29/2018   Procedure: REMOVAL RIGHT ANKLE BIOMET ZIMMER EXTERNAL FIXATOR, SHORT LEG CAST APPLICATION;  Surgeon: Eldred Manges,  MD;  Location: [facility] OR;  Service: Orthopedics;  Laterality: Right;   INSERTION OF DIALYSIS CATHETER  05/26/2023   Procedure: INSERTION OF Left internal jugular DIALYSIS CATHETER;  Surgeon: Victorino Sparrow, MD;  Location[facility];  Service: Vascular;;   IR FLUORO GUIDE CV LINE LEFT  08/28/2023   IR FLUORO GUIDE CV LINE RIGHT  09/24/2023   IR PATIENT EVAL TECH 0-60 MINS  09/24/2023   IR US GUIDE VASC ACCESS LEFT  08/28/2023   IR US GUIDE VASC ACCESS RIGHT  09/24/2023   MASTECTOMY     left sided   ORIF ANKLE FRACTURE Right 10/06/2018   Procedure: OPEN REDUCTION INTERNAL FIXATION (ORIF) RIGHT ANKLE  TRIMALLEOLAR;  Surgeon: Eldred Manges, MD;  Location: [facility] OR;  Service: Orthopedics;  Laterality: Right;   PERIPHERAL VASCULAR BALLOON ANGIOPLASTY  08/13/2023   Procedure: PERIPHERAL VASCULAR BALLOON ANGIOPLASTY;  Surgeon: Nada Libman, MD;  Location: [facility];  Service: Cardiovascular;;   REVISON OF ARTERIOVENOUS FISTULA Left 05/26/2023   Procedure: REVISON OF ARTERIOVENOUS FISTULA LEFT ARM HEMATOMA WASH OUT;  Surgeon: Victorino Sparrow, MD;  Location: [facility] OR;  Service: Vascular;  Laterality: Left;   RIGHT/LEFT HEART CATH AND CORONARY ANGIOGRAPHY N/A 11/27/2022   Procedure: RIGHT/LEFT HEART CATH AND CORONARY ANGIOGRAPHY;  Surgeon: Swaziland, Peter M, MD;  Location: [facility] INVASIVE CV LAB;  Service: Cardiovascular;  Laterality: N/A;   TEE WITHOUT CARDIOVERSION N/A 09/18/2023   Procedure: TRANSESOPHAGEAL ECHOCARDIOGRAM (TEE);  Surgeon: Jonelle Sidle, MD;  Location: [facility];  Service: Cardiovascular;  Laterality: N/A;   Patient Active Problem List   Diagnosis Date Noted   Staphylococcus aureus bacteremia 09/18/2023   Bacteremia 09/16/2023   Respiratory failure (HCC) 09/14/2023   Severe sepsis (HCC) 09/14/2023   Type 2 diabetes mellitus with hyperlipidemia (HCC) 09/14/2023   Acute anemia 06/03/2023   Complication of AV dialysis fistula 05/26/2023   Anemia of chronic disease 05/26/2023   Mixed hyperlipidemia 03/23/2023   Normocytic anemia 02/12/2023   Abdominal pain 02/12/2023   Chronic respiratory failure with hypoxia (HCC) 01/15/2023   Other specified disorders of bone density and structure, other site 01/02/2023   Acute on chronic hypoxic respiratory failure (HCC) 12/10/2022   Precordial chest pain 11/26/2022   ESRD needing dialysis (HCC) 11/20/2022   Class 3 obesity 11/19/2022   BOOP (bronchiolitis obliterans with organizing pneumonia) (HCC) 11/19/2022   Bronchiolitis obliterans (HCC) 11/19/2022   Post-traumatic osteoarthritis, right ankle and foot 08/31/2022   Nodular goiter  05/24/2022   Elevated alkaline phosphatase level 05/24/2022   Right foot sprain 12/20/2020   Contusion of left shoulder 12/20/2020   Syncope 08/03/2020   Acute pulmonary edema (HCC) 01/11/2020   OSA (obstructive sleep apnea)    Closed right ankle fracture 10/06/2018   Closed displaced trimalleolar fracture of right ankle 10/06/2018   Acute colitis 02/26/2018   Hypoxemia 02/24/2018   Dyspnea 11/26/2017   Acute on chronic anemia 11/26/2017   Elevated troponin 11/26/2017   Hyperkalemia 09/29/2017   Acute on chronic respiratory failure with hypoxia (HCC) 09/28/2017   CAP (community acquired pneumonia) 09/26/2017   Volume overload 09/25/2017   Acute on chronic diastolic CHF (congestive heart failure) (HCC) 09/25/2017   Lactic acidosis 09/25/2017   Hypokalemia 09/25/2017   Breast cancer (HCC) 07/22/2017   SOB (shortness of breath)    Acute respiratory failure with hypoxia (HCC) 07/21/2017   Flatulence 10/25/2016   GERD (gastroesophageal reflux disease) 08/02/2016   Pyloric stenosis, acquired    Nausea with vomiting  Generalized abdominal pain    Chest pain, rule out acute myocardial infarction 07/04/2016   Constipation 07/04/2016   Nausea 07/04/2016   Essential hypertension 07/04/2016   HCAP (healthcare-associated pneumonia) 12/04/2015   ESRD on dialysis (HCC) 12/04/2015   Diabetes mellitus with end-stage renal disease (HCC) 12/04/2015    ONSET DATE: 10/10/24  REFERRING DIAG:  Diagnosis  S81.801D (ICD-10-CM) - Unspecified open wound, right lower leg, subsequent encounter    THERAPY DIAG:  Diagnosis  S81.801D (ICD-10-CM) - Unspecified open wound, right lower leg, subsequent encounter    Rationale for Evaluation and Treatment: Rehabilitation     Wound Therapy - 01/07/24 0001     Subjective Pt arrived with dressings intact, reports the dressing has slid down with increased pain today.    Patient and Family Stated Goals wounds to heal    Date of Onset 10/10/24     Prior Treatments various ointments and antibiotics    Pain Scale 0-10    Pain Score 8    Increased pain during debridement of posterior wounds   Pain Type Acute pain    Pain Location Leg    Pain Orientation Right;Anterior;Posterior    Pain Descriptors / Indicators Burning;Sharp    Pain Onset --   with touch, debridement   Patients Stated Pain Goal 0    Pain Intervention(s) Emotional support    Evaluation and Treatment Procedures Explained to Patient/Family Yes    Evaluation and Treatment Procedures agreed to    Wound Properties Date First Assessed: 01/02/24 Time First Assessed: 1350 Wound Type: Other (Comment) Location: Leg Location Orientation: Right;Medial;Posterior Wound Description (Comments): multiple small wounds Present on Admission: Yes   Wound Image Images linked: 1    Dressing Type Gauze (Comment);Compression wrap   lotion/vaseline, medihoney, 2x2, ABD and cotton for shape, kerlix and coban   Dressing Changed Changed    Dressing Status Old drainage    Dressing Change Frequency PRN    Site / Wound Assessment Dusky;Brown    % Wound base Red or Granulating 70%    % Wound base Yellow/Fibrinous Exudate 20%    % Wound base Black/Eschar 10%    Peri-wound Assessment Edema    Wound Length (cm) --   multiple wounds on medial anterior and posterior Rt LE   Drainage Amount Scant    Drainage Description Serous    Treatment Cleansed;Debridement (Selective)    Selective Debridement (non-excisional) - Location wound bed    Selective Debridement (non-excisional) - Tools Used Forceps;Scalpel    Selective Debridement (non-excisional) - Tissue Removed very limited due to pain    Wound Therapy - Clinical Statement see below    Factors Delaying/Impairing Wound Healing Diabetes Mellitus;Multiple medical problems;Polypharmacy;Vascular compromise;Other (comment)   end stage kidney failure   Hydrotherapy Plan Debridement;Dressing change;Patient/family education    Wound Therapy - Frequency 2X / week     Wound Therapy - Current Recommendations PT    Wound Plan debridement and dressing change    Dressing  lotion/vaseline, medihoney, 2x2, ABD and cotton for shape, kerlix and coban               PATIENT EDUCATION: Education details: keep dressing dry , remove if it becomes painful Person educated: Patient Education method: Explanation Education comprehension: verbalized understanding   HOME EXERCISE PROGRAM: N/A   GOALS: Goals reviewed with patient? No  SHORT TERM GOALS: Target date: 01/18/24  PT wounds to be 100% granulated Baseline: Goal status: INITIAL  2.  Pt pain to be no greater than  a 3/10 Baseline:  Goal status: INITIAL   [place] TERM GOALS: Target date: 02/01/24  Pt wound to be healed  Baseline:  Goal status: INITIAL  2.  PT to have no pain in her Right LE  Baseline:  Goal status: INITIAL   ASSESSMENT:  CLINICAL IMPRESSION: 01/07/24:  Dressing have slid down causing bottle neck superior wound and increased swelling around Rt ankle.  Selective debridement for removal of slough from wound bed and dead skin perimeter to promote healing.  Continued with medihoney to assist with slough removal in future apt.  Changed dressings beginning at metatarsal region to knee, used ABD and cotton for shape, kerlix and coban for edema control.    Eval:Patient is a 53 y.o. female who was seen today for physical therapy evaluation and treatment for multiple non healing wounds on pt Rt LE.  Pt states that her port became infected in October, she was admitted to the hospital for IV antibiotics.  She did not have anything on her legs but a day after she got home she went to the MD as she had red spots all over her legs which turned into wounds.   She has tried medication and has been on antibiotics.  This helped the Lt leg but the Rt leg continues to have multiple wounds on it.   Patricia [name] will benefit from skilled PT to create a more healing environment and debride her wounds to  allow healing to occur.    OBJECTIVE IMPAIRMENTS: increased edema, pain, and decreased skin integrity .   ACTIVITY LIMITATIONS: bathing and dressing   PERSONAL FACTORS: Fitness, Time since onset of injury/illness/exacerbation, and 1-2 comorbidities: DM, end stage renal failure  are also affecting patient's functional outcome.   REHAB POTENTIAL: Good  CLINICAL DECISION MAKING: Stable/uncomplicated  EVALUATION COMPLEXITY: Low  PLAN: PT FREQUENCY: 2x/week  PT DURATION: 4 weeks  PLANNED INTERVENTIONS: 97535- Self Care, 16109- Manual therapy, 97597- Wound care (first 20 sq cm), and Patient/Family education  PLAN FOR NEXT SESSION: keep moist warm cloth on wounds for five minutes prior to trying to debride.  Debride and dressing change   Becky Sax, LPTA/CLT; CBIS [phone]  Juel Burrow, PTA 01/07/2024, 4:33 PM  01/07/2024, 4:33 PM

## 2024-01-08 DIAGNOSIS — N250 Renal osteodystrophy: Secondary | ICD-10-CM | POA: Diagnosis not present

## 2024-01-08 DIAGNOSIS — N2581 Secondary hyperparathyroidism of renal origin: Secondary | ICD-10-CM | POA: Diagnosis not present

## 2024-01-08 DIAGNOSIS — N186 End stage renal disease: Secondary | ICD-10-CM | POA: Diagnosis not present

## 2024-01-08 DIAGNOSIS — Z992 Dependence on renal dialysis: Secondary | ICD-10-CM | POA: Diagnosis not present

## 2024-01-08 DIAGNOSIS — D631 Anemia in chronic kidney disease: Secondary | ICD-10-CM | POA: Diagnosis not present

## 2024-01-08 DIAGNOSIS — D509 Iron deficiency anemia, unspecified: Secondary | ICD-10-CM | POA: Diagnosis not present

## 2024-01-09 ENCOUNTER — Ambulatory Visit (HOSPITAL_COMMUNITY): Payer: Medicare Other | Admitting: Physical Therapy

## 2024-01-09 DIAGNOSIS — M79661 Pain in right lower leg: Secondary | ICD-10-CM | POA: Diagnosis not present

## 2024-01-09 DIAGNOSIS — S81801A Unspecified open wound, right lower leg, initial encounter: Secondary | ICD-10-CM

## 2024-01-09 NOTE — Therapy (Addendum)
OUTPATIENT PHYSICAL THERAPY WOUNDCARE   Patient Name: Patricia [name] MRN: [id number] DOB:25-May-1971, 53 y.o., female Today's Date: 01/09/2024   PCP: Valentino Hue  REFERRING PROVIDER: [name], PA-C  END OF SESSION:  PT End of Session - 01/09/24 1421     Visit Number 3    Number of Visits 8    Date for PT Re-Evaluation 02/01/24    Authorization Type medicare    PT Start Time 1315    PT Stop Time 1345    PT Time Calculation (min) 30 min    Activity Tolerance Patient tolerated treatment well    Behavior During Therapy WFL for tasks assessed/performed             Past Medical History:  Diagnosis Date   Anemia    Ankle fracture    Arthritis    Asthma    Blood transfusion without reported diagnosis    Breast cancer (HCC)    Cancer (HCC)    COVID    Diabetes mellitus without complication (HCC)    Dyspnea    End stage renal disease on dialysis (HCC)    M/W/F [facility] in [place]   GERD (gastroesophageal reflux disease)    Hypertension    OSA (obstructive sleep apnea)    uses CPAP sometimes   Pneumonia    PONV (postoperative nausea and vomiting)    Wears glasses    Past Surgical History:  Procedure Laterality Date   A/V FISTULAGRAM Left 08/13/2023   Procedure: A/V Fistulagram;  Surgeon: Nada Libman, MD;  Location: [facility];  Service: Cardiovascular;  Laterality: Left;   ABDOMINAL HYSTERECTOMY     APPLICATION OF WOUND VAC Left 05/26/2023   Procedure: APPLICATION OF WOUND VAC;  Surgeon: Victorino Sparrow, MD;  Location: [facility] OR;  Service: Vascular;  Laterality: Left;   AV FISTULA PLACEMENT  11/2014   at [facility] hospital   AV FISTULA REPAIR N/A 04/2021   BALLOON DILATION N/A 07/10/2016   Procedure: BALLOON DILATION;  Surgeon: West Bali, MD;  Location: [facility];  Service: Endoscopy;  Laterality: N/A;  Pyloric dilation   BREAST LUMPECTOMY     CATARACT EXTRACTION W/PHACO Right 05/24/2023   Procedure: CATARACT EXTRACTION PHACO AND  INTRAOCULAR LENS PLACEMENT (IOC);  Surgeon: [name], MD;  Location: [facility];  Service: Ophthalmology;  Laterality: Right;  CDE: 5.00   CESAREAN SECTION     CHOLECYSTECTOMY     COLONOSCOPY  02/2023   COLONOSCOPY WITH PROPOFOL N/A 09/27/2016   Dr. Jena Gauss: Internal hemorrhoids repeat colonoscopy in 10 years   COLONOSCOPY WITH PROPOFOL N/A 02/28/2023   Procedure: COLONOSCOPY WITH PROPOFOL;  Surgeon: Corbin Ade, MD;  Location: [facility];  Service: Endoscopy;  Laterality: N/A;  11:45 am, asa 3 dialysis pt   DIALYSIS/PERMA CATHETER INSERTION N/A 11/15/2023   Procedure: DIALYSIS/PERMA CATHETER INSERTION;  Surgeon: Nada Libman, MD;  Location: [facility] INVASIVE CV LAB;  Service: Cardiovascular;  Laterality: N/A;   DILATION AND CURETTAGE OF UTERUS     ESOPHAGOGASTRODUODENOSCOPY N/A 07/10/2016   Dr.Fields- normal esophagus, gastric stenosis was found at the pylorus, gastritis on bx, normal examined duodenun   ESOPHAGOGASTRODUODENOSCOPY (EGD) WITH PROPOFOL N/A 11/21/2022   Procedure: ESOPHAGOGASTRODUODENOSCOPY (EGD) WITH PROPOFOL;  Surgeon: Dolores Frame, MD;  Location: [facility];  Service: Gastroenterology;  Laterality: N/A;   EXTERNAL FIXATION REMOVAL Right 10/29/2018   Procedure: REMOVAL RIGHT ANKLE BIOMET ZIMMER EXTERNAL FIXATOR, SHORT LEG CAST APPLICATION;  Surgeon: Eldred Manges, MD;  Location: [facility] OR;  Service: Orthopedics;  Laterality: Right;   INSERTION OF DIALYSIS CATHETER  05/26/2023   Procedure: INSERTION OF Left internal jugular DIALYSIS CATHETER;  Surgeon: Victorino Sparrow, MD;  Location: [facility] OR;  Service: Vascular;;   IR FLUORO GUIDE CV LINE LEFT  08/28/2023   IR FLUORO GUIDE CV LINE RIGHT  09/24/2023   IR PATIENT EVAL TECH 0-60 MINS  09/24/2023   IR US GUIDE VASC ACCESS LEFT  08/28/2023   IR US GUIDE VASC ACCESS RIGHT  09/24/2023   MASTECTOMY     left sided   ORIF ANKLE FRACTURE Right 10/06/2018   Procedure: OPEN REDUCTION INTERNAL FIXATION (ORIF) RIGHT ANKLE  TRIMALLEOLAR;  Surgeon: Eldred Manges, MD;  Location: [facility] OR;  Service: Orthopedics;  Laterality: Right;   PERIPHERAL VASCULAR BALLOON ANGIOPLASTY  08/13/2023   Procedure: PERIPHERAL VASCULAR BALLOON ANGIOPLASTY;  Surgeon: Nada Libman, MD;  Location: [facility];  Service: Cardiovascular;;   REVISON OF ARTERIOVENOUS FISTULA Left 05/26/2023   Procedure: REVISON OF ARTERIOVENOUS FISTULA LEFT ARM HEMATOMA WASH OUT;  Surgeon: Victorino Sparrow, MD;  Location: [facility] OR;  Service: Vascular;  Laterality: Left;   RIGHT/LEFT HEART CATH AND CORONARY ANGIOGRAPHY N/A 11/27/2022   Procedure: RIGHT/LEFT HEART CATH AND CORONARY ANGIOGRAPHY;  Surgeon: Swazil[place], Peter M, MD;  Location: [facility] INVASIVE CV LAB;  Service: Cardiovascular;  Laterality: N/A;   TEE WITHOUT CARDIOVERSION N/A 09/18/2023   Procedure: TRANSESOPHAGEAL ECHOCARDIOGRAM (TEE);  Surgeon: Jonelle Sidle, MD;  Location: [facility];  Service: Cardiovascular;  Laterality: N/A;   Patient Active Problem List   Diagnosis Date Noted   Staphylococcus aureus bacteremia 09/18/2023   Bacteremia 09/16/2023   Respiratory failure (HCC) 09/14/2023   Severe sepsis (HCC) 09/14/2023   Type 2 diabetes mellitus with hyperlipidemia (HCC) 09/14/2023   Acute anemia 06/03/2023   Complication of AV dialysis fistula 05/26/2023   Anemia of chronic disease 05/26/2023   Mixed hyperlipidemia 03/23/2023   Normocytic anemia 02/12/2023   Abdominal pain 02/12/2023   Chronic respiratory failure with hypoxia (HCC) 01/15/2023   Other specified disorders of bone density and structure, other site 01/02/2023   Acute on chronic hypoxic respiratory failure (HCC) 12/10/2022   Precordial chest pain 11/26/2022   ESRD needing dialysis (HCC) 11/20/2022   Class 3 obesity 11/19/2022   BOOP (bronchiolitis obliterans with organizing pneumonia) (HCC) 11/19/2022   Bronchiolitis obliterans (HCC) 11/19/2022   Post-traumatic osteoarthritis, right ankle and foot 08/31/2022   Nodular goiter  05/24/2022   Elevated alkaline phosphatase level 05/24/2022   Right foot sprain 12/20/2020   Contusion of left shoulder 12/20/2020   Syncope 08/03/2020   Acute pulmonary edema (HCC) 01/11/2020   OSA (obstructive sleep apnea)    Closed right ankle fracture 10/06/2018   Closed displaced trimalleolar fracture of right ankle 10/06/2018   Acute colitis 02/26/2018   Hypoxemia 02/24/2018   Dyspnea 11/26/2017   Acute on chronic anemia 11/26/2017   Elevated troponin 11/26/2017   Hyperkalemia 09/29/2017   Acute on chronic respiratory failure with hypoxia (HCC) 09/28/2017   CAP (community acquired pneumonia) 09/26/2017   Volume overload 09/25/2017   Acute on chronic diastolic CHF (congestive heart failure) (HCC) 09/25/2017   Lactic acidosis 09/25/2017   Hypokalemia 09/25/2017   Breast cancer (HCC) 07/22/2017   SOB (shortness of breath)    Acute respiratory failure with hypoxia (HCC) 07/21/2017   Flatulence 10/25/2016   GERD (gastroesophageal reflux disease) 08/02/2016   Pyloric stenosis, acquired    Nausea with vomiting  Generalized abdominal pain    Chest pain, rule out acute myocardial infarction 07/04/2016   Constipation 07/04/2016   Nausea 07/04/2016   Essential hypertension 07/04/2016   HCAP (healthcare-associated pneumonia) 12/04/2015   ESRD on dialysis (HCC) 12/04/2015   Diabetes mellitus with end-stage renal disease (HCC) 12/04/2015    ONSET DATE: 10/10/24  REFERRING DIAG:  Diagnosis  S81.801D (ICD-10-CM) - Unspecified open wound, right lower leg, subsequent encounter    THERAPY DIAG:  Diagnosis  S81.801D (ICD-10-CM) - Unspecified open wound, right lower leg, subsequent encounter    Rationale for Evaluation and Treatment: Rehabilitation     Wound Therapy - 01/09/24 1422     Subjective Pt arrived with dressings intact, late for session today    Patient and Family Stated Goals wounds to heal    Date of Onset 10/10/24    Prior Treatments various ointments and  antibiotics    Pain Scale 0-10    Pain Score 0-No pain    Evaluation and Treatment Procedures Explained to Patient/Family Yes    Evaluation and Treatment Procedures agreed to    Wound Properties Date First Assessed: 01/02/24 Time First Assessed: 1350 Wound Type: Other (Comment) Location: Leg Location Orientation: Right;Medial;Posterior Wound Description (Comments): multiple small wounds Present on Admission: Yes   Wound Image Images linked: 2    Dressing Type Gauze (Comment);Compression wrap   lotion/vaseline, medihoney, 2x2, ABD and cotton for shape, kerlix and coban   Dressing Changed New    Dressing Status Old drainage    Dressing Change Frequency PRN    Site / Wound Assessment Dusky;Brown    % Wound base Red or Granulating 50%    % Wound base Yellow/Fibrinous Exudate 40%    % Wound base Black/Eschar 10%    Peri-wound Assessment Edema;Other (Comment)   dry   Drainage Amount Scant    Drainage Description Serous    Treatment Cleansed;Debridement (Selective)    Selective Debridement (non-excisional) - Location wound bed    Selective Debridement (non-excisional) - Tools Used Forceps;Scalpel    Selective Debridement (non-excisional) - Tissue Removed very limited due to pain    Wound Therapy - Clinical Statement see below    Factors Delaying/Impairing Wound Healing Diabetes Mellitus;Multiple medical problems;Polypharmacy;Vascular compromise;Other (comment)   end stage kidney failure   Hydrotherapy Plan Debridement;Dressing change;Patient/family education    Wound Therapy - Frequency 2X / week    Wound Therapy - Current Recommendations PT    Wound Plan debridement and dressing change    Dressing  lotion/vaseline, medihoney, 2x2, ABD and cotton for shape, kerlix and coban               PATIENT EDUCATION: Education details: keep dressing dry , remove if it becomes painful Person educated: Patient Education method: Explanation Education comprehension: verbalized  understanding   HOME EXERCISE PROGRAM: N/A   GOALS: Goals reviewed with patient? No  SHORT TERM GOALS: Target date: 01/18/24  PT wounds to be 100% granulated Baseline: Goal status: INITIAL  2.  Pt pain to be no greater than a 3/10 Baseline:  Goal status: INITIAL   [place] TERM GOALS: Target date: 02/01/24  Pt wound to be healed  Baseline:  Goal status: INITIAL  2.  PT to have no pain in her Right LE  Baseline:  Goal status: INITIAL   ASSESSMENT:  CLINICAL IMPRESSION: 01/09/24:  Dressing slightly slid down but without bottle necking today.  Selective debridement for removal of slough from wound bed anteriorly and mostly escar on posterior.  Noted  approximation of all wounds with smallest ones appearing to be healed at this point.  3 anteriorly and 4 areas posteriorly. Continued with medihoney to assist with softening up remaining devitalized tissue.Used cotton for shape, kerlix and coban for edema control.    Eval:Patient is a 53 y.o. female who was seen today for physical therapy evaluation and treatment for multiple non healing wounds on pt Rt LE.  Pt states that her port became infected in October, she was admitted to the hospital for IV antibiotics.  She did not have anything on her legs but a day after she got home she went to the MD as she had red spots all over her legs which turned into wounds.   She has tried medication and has been on antibiotics.  This helped the Lt leg but the Rt leg continues to have multiple wounds on it.   Ms.  Sachs will benefit from skilled PT to create a more healing environment and debride her wounds to allow healing to occur.    OBJECTIVE IMPAIRMENTS: increased edema, pain, and decreased skin integrity .   ACTIVITY LIMITATIONS: bathing and dressing   PERSONAL FACTORS: Fitness, Time since onset of injury/illness/exacerbation, and 1-2 comorbidities: DM, end stage renal failure  are also affecting patient's functional outcome.   REHAB POTENTIAL:  Good  CLINICAL DECISION MAKING: Stable/uncomplicated  EVALUATION COMPLEXITY: Low  PLAN: PT FREQUENCY: 2x/week  PT DURATION: 4 weeks  PLANNED INTERVENTIONS: 97535- Self Care, [id number]- Manual therapy, 97597- Wound care (first 20 sq cm), and Patient/Family education  PLAN FOR NEXT SESSION: continue to debride away eschar and necrotic tissue to increase approximation  [place], Jeffey Janssen B, PTA 01/09/2024, 2:23 PM  01/09/2024, 2:23 PM

## 2024-01-10 DIAGNOSIS — Z992 Dependence on renal dialysis: Secondary | ICD-10-CM | POA: Diagnosis not present

## 2024-01-10 DIAGNOSIS — N2581 Secondary hyperparathyroidism of renal origin: Secondary | ICD-10-CM | POA: Diagnosis not present

## 2024-01-10 DIAGNOSIS — D631 Anemia in chronic kidney disease: Secondary | ICD-10-CM | POA: Diagnosis not present

## 2024-01-10 DIAGNOSIS — D509 Iron deficiency anemia, unspecified: Secondary | ICD-10-CM | POA: Diagnosis not present

## 2024-01-10 DIAGNOSIS — N250 Renal osteodystrophy: Secondary | ICD-10-CM | POA: Diagnosis not present

## 2024-01-10 DIAGNOSIS — N186 End stage renal disease: Secondary | ICD-10-CM | POA: Diagnosis not present

## 2024-01-12 DIAGNOSIS — D509 Iron deficiency anemia, unspecified: Secondary | ICD-10-CM | POA: Diagnosis not present

## 2024-01-12 DIAGNOSIS — D631 Anemia in chronic kidney disease: Secondary | ICD-10-CM | POA: Diagnosis not present

## 2024-01-12 DIAGNOSIS — N2581 Secondary hyperparathyroidism of renal origin: Secondary | ICD-10-CM | POA: Diagnosis not present

## 2024-01-12 DIAGNOSIS — N186 End stage renal disease: Secondary | ICD-10-CM | POA: Diagnosis not present

## 2024-01-12 DIAGNOSIS — N250 Renal osteodystrophy: Secondary | ICD-10-CM | POA: Diagnosis not present

## 2024-01-12 DIAGNOSIS — Z992 Dependence on renal dialysis: Secondary | ICD-10-CM | POA: Diagnosis not present

## 2024-01-13 DIAGNOSIS — D509 Iron deficiency anemia, unspecified: Secondary | ICD-10-CM | POA: Diagnosis not present

## 2024-01-13 DIAGNOSIS — Z992 Dependence on renal dialysis: Secondary | ICD-10-CM | POA: Diagnosis not present

## 2024-01-13 DIAGNOSIS — N186 End stage renal disease: Secondary | ICD-10-CM | POA: Diagnosis not present

## 2024-01-13 DIAGNOSIS — N2581 Secondary hyperparathyroidism of renal origin: Secondary | ICD-10-CM | POA: Diagnosis not present

## 2024-01-13 DIAGNOSIS — D631 Anemia in chronic kidney disease: Secondary | ICD-10-CM | POA: Diagnosis not present

## 2024-01-13 DIAGNOSIS — N250 Renal osteodystrophy: Secondary | ICD-10-CM | POA: Diagnosis not present

## 2024-01-14 ENCOUNTER — Ambulatory Visit (HOSPITAL_COMMUNITY): Payer: Medicare Other | Admitting: Physical Therapy

## 2024-01-14 DIAGNOSIS — Z4901 Encounter for fitting and adjustment of extracorporeal dialysis catheter: Secondary | ICD-10-CM | POA: Diagnosis not present

## 2024-01-15 DIAGNOSIS — D631 Anemia in chronic kidney disease: Secondary | ICD-10-CM | POA: Diagnosis not present

## 2024-01-15 DIAGNOSIS — N2581 Secondary hyperparathyroidism of renal origin: Secondary | ICD-10-CM | POA: Diagnosis not present

## 2024-01-15 DIAGNOSIS — D509 Iron deficiency anemia, unspecified: Secondary | ICD-10-CM | POA: Diagnosis not present

## 2024-01-15 DIAGNOSIS — N186 End stage renal disease: Secondary | ICD-10-CM | POA: Diagnosis not present

## 2024-01-15 DIAGNOSIS — Z992 Dependence on renal dialysis: Secondary | ICD-10-CM | POA: Diagnosis not present

## 2024-01-15 DIAGNOSIS — N250 Renal osteodystrophy: Secondary | ICD-10-CM | POA: Diagnosis not present

## 2024-01-16 ENCOUNTER — Ambulatory Visit (HOSPITAL_COMMUNITY)
Admission: RE | Admit: 2024-01-16 | Discharge: 2024-01-16 | Disposition: A | Discharge status: Home / Self Care | Payer: Medicare Other | Source: Ambulatory Visit | Attending: Family Medicine | Admitting: Family Medicine

## 2024-01-16 ENCOUNTER — Other Ambulatory Visit (HOSPITAL_COMMUNITY): Payer: Self-pay | Admitting: Family Medicine

## 2024-01-16 ENCOUNTER — Encounter (HOSPITAL_COMMUNITY): Payer: Self-pay

## 2024-01-16 ENCOUNTER — Ambulatory Visit (HOSPITAL_COMMUNITY): Payer: Medicare Other

## 2024-01-16 DIAGNOSIS — M5441 Lumbago with sciatica, right side: Secondary | ICD-10-CM

## 2024-01-16 DIAGNOSIS — M533 Sacrococcygeal disorders, not elsewhere classified: Secondary | ICD-10-CM | POA: Insufficient documentation

## 2024-01-16 DIAGNOSIS — I700 Atherosclerosis of aorta: Secondary | ICD-10-CM | POA: Diagnosis not present

## 2024-01-16 DIAGNOSIS — S81801A Unspecified open wound, right lower leg, initial encounter: Secondary | ICD-10-CM | POA: Diagnosis not present

## 2024-01-16 DIAGNOSIS — M47816 Spondylosis without myelopathy or radiculopathy, lumbar region: Secondary | ICD-10-CM | POA: Diagnosis not present

## 2024-01-16 DIAGNOSIS — M79661 Pain in right lower leg: Secondary | ICD-10-CM | POA: Diagnosis not present

## 2024-01-16 DIAGNOSIS — M549 Dorsalgia, unspecified: Secondary | ICD-10-CM | POA: Diagnosis not present

## 2024-01-16 DIAGNOSIS — M48061 Spinal stenosis, lumbar region without neurogenic claudication: Secondary | ICD-10-CM | POA: Diagnosis not present

## 2024-01-16 NOTE — Therapy (Signed)
OUTPATIENT PHYSICAL THERAPY WOUNDCARE   Patient Name: Patricia [name] MRN: [id number] DOB:04/10/1971, 53 y.o., female Today's Date: 01/16/2024   PCP: Valentino Hue  REFERRING PROVIDER: [name], PA-C  END OF SESSION:  PT End of Session - 01/16/24 1445     Visit Number 4    Number of Visits 8    Date for PT Re-Evaluation 02/01/24    Authorization Type medicare    PT Start Time 1350    PT Stop Time 1430    PT Time Calculation (min) 40 min    Activity Tolerance Patient tolerated treatment well    Behavior During Therapy WFL for tasks assessed/performed             Past Medical History:  Diagnosis Date   Anemia    Ankle fracture    Arthritis    Asthma    Blood transfusion without reported diagnosis    Breast cancer (HCC)    Cancer (HCC)    COVID    Diabetes mellitus without complication (HCC)    Dyspnea    End stage renal disease on dialysis (HCC)    M/W/F [facility] in [place]   GERD (gastroesophageal reflux disease)    Hypertension    OSA (obstructive sleep apnea)    uses CPAP sometimes   Pneumonia    PONV (postoperative nausea and vomiting)    Wears glasses    Past Surgical History:  Procedure Laterality Date   A/V FISTULAGRAM Left 08/13/2023   Procedure: A/V Fistulagram;  Surgeon: Nada Libman, MD;  Location: [facility];  Service: Cardiovascular;  Laterality: Left;   ABDOMINAL HYSTERECTOMY     APPLICATION OF WOUND VAC Left 05/26/2023   Procedure: APPLICATION OF WOUND VAC;  Surgeon: Victorino Sparrow, MD;  Location: [facility] OR;  Service: Vascular;  Laterality: Left;   AV FISTULA PLACEMENT  11/2014   at [facility] hospital   AV FISTULA REPAIR N/A 04/2021   BALLOON DILATION N/A 07/10/2016   Procedure: BALLOON DILATION;  Surgeon: West Bali, MD;  Location: [facility];  Service: Endoscopy;  Laterality: N/A;  Pyloric dilation   BREAST LUMPECTOMY     CATARACT EXTRACTION W/PHACO Right 05/24/2023   Procedure: CATARACT EXTRACTION PHACO AND  INTRAOCULAR LENS PLACEMENT (IOC);  Surgeon: [name], MD;  Location: [facility];  Service: Ophthalmology;  Laterality: Right;  CDE: 5.00   CESAREAN SECTION     CHOLECYSTECTOMY     COLONOSCOPY  02/2023   COLONOSCOPY WITH PROPOFOL N/A 09/27/2016   Dr. Jena Gauss: Internal hemorrhoids repeat colonoscopy in 10 years   COLONOSCOPY WITH PROPOFOL N/A 02/28/2023   Procedure: COLONOSCOPY WITH PROPOFOL;  Surgeon: Corbin Ade, MD;  Location: [facility];  Service: Endoscopy;  Laterality: N/A;  11:45 am, asa 3 dialysis pt   DIALYSIS/PERMA CATHETER INSERTION N/A 11/15/2023   Procedure: DIALYSIS/PERMA CATHETER INSERTION;  Surgeon: Nada Libman, MD;  Location: [facility] INVASIVE CV LAB;  Service: Cardiovascular;  Laterality: N/A;   DILATION AND CURETTAGE OF UTERUS     ESOPHAGOGASTRODUODENOSCOPY N/A 07/10/2016   Dr.Fields- normal esophagus, gastric stenosis was found at the pylorus, gastritis on bx, normal examined duodenun   ESOPHAGOGASTRODUODENOSCOPY (EGD) WITH PROPOFOL N/A 11/21/2022   Procedure: ESOPHAGOGASTRODUODENOSCOPY (EGD) WITH PROPOFOL;  Surgeon: Dolores Frame, MD;  [facility]: [facility];  Service: Gastroenterology;  Laterality: N/A;   EXTERNAL FIXATION REMOVAL Right 10/29/2018   Procedure: REMOVAL RIGHT ANKLE BIOMET ZIMMER EXTERNAL FIXATOR, SHORT LEG CAST APPLICATION;  Surgeon: Eldred Manges, MD;  Location: [facility] OR;  Service: Orthopedics;  Laterality: Right;   INSERTION OF DIALYSIS CATHETER  05/26/2023   Procedure: INSERTION OF Left internal jugular DIALYSIS CATHETER;  Surgeon: Victorino Sparrow, MD;  Location: [facility] OR;  Service: Vascular;;   IR FLUORO GUIDE CV LINE LEFT  08/28/2023   IR FLUORO GUIDE CV LINE RIGHT  09/24/2023   IR PATIENT EVAL TECH 0-60 MINS  09/24/2023   IR US GUIDE VASC ACCESS LEFT  08/28/2023   IR US GUIDE VASC ACCESS RIGHT  09/24/2023   MASTECTOMY     left sided   ORIF ANKLE FRACTURE Right 10/06/2018   Procedure: OPEN REDUCTION INTERNAL FIXATION (ORIF) RIGHT ANKLE  TRIMALLEOLAR;  Surgeon: Eldred Manges, MD;  Location: [facility] OR;  Service: Orthopedics;  Laterality: Right;   PERIPHERAL VASCULAR BALLOON ANGIOPLASTY  08/13/2023   Procedure: PERIPHERAL VASCULAR BALLOON ANGIOPLASTY;  Surgeon: Nada Libman, MD;  Location: [facility];  Service: Cardiovascular;;   REVISON OF ARTERIOVENOUS FISTULA Left 05/26/2023   Procedure: REVISON OF ARTERIOVENOUS FISTULA LEFT ARM HEMATOMA WASH OUT;  Surgeon: Victorino Sparrow, MD;  Location: [facility] OR;  Service: Vascular;  Laterality: Left;   RIGHT/LEFT HEART CATH AND CORONARY ANGIOGRAPHY N/A 11/27/2022   Procedure: RIGHT/LEFT HEART CATH AND CORONARY ANGIOGRAPHY;  Surgeon: Swaziland, Peter M, MD;  Location: [facility] INV[facility] CV LAB;  Service: Cardiovascular;  Laterality: N/A;   TEE WITHOUT CARDIOVERSION N/A 09/18/2023   Procedure: TRANSESOPHAGEAL ECHOCARDIOGRAM (TEE);  Surgeon: Jonelle Sidle, MD;  Location: [facility];  Service: Cardiovascular;  Laterality: N/A;   Patient Active Problem List   Diagnosis Date Noted   Staphylococcus aureus bacteremia 09/18/2023   Bacteremia 09/16/2023   Respiratory failure (HCC) 09/14/2023   Severe sepsis (HCC) 09/14/2023   Type 2 diabetes mellitus with hyperlipidemia (HCC) 09/14/2023   Acute anemia 06/03/2023   Complication of AV dialysis fistula 05/26/2023   Anemia of chronic disease 05/26/2023   Mixed hyperlipidemia 03/23/2023   Normocytic anemia 02/12/2023   Abdominal pain 02/12/2023   Chronic respiratory failure with hypoxia (HCC) 01/15/2023   Other specified disorders of bone density and structure, other site 01/02/2023   Acute on chronic hypoxic respiratory failure (HCC) 12/10/2022   Precordial chest pain 11/26/2022   ESRD needing dialysis (HCC) 11/20/2022   Class 3 obesity 11/19/2022   BOOP (bronchiolitis obliterans with organizing pneumonia) (HCC) 11/19/2022   Bronchiolitis obliterans (HCC) 11/19/2022   Post-traumatic osteoarthritis, right ankle and foot 08/31/2022   Nodular goiter  05/24/2022   Elevated alkaline phosphatase level 05/24/2022   Right foot sprain 12/20/2020   Contusion of left shoulder 12/20/2020   Syncope 08/03/2020   Acute pulmonary edema (HCC) 01/11/2020   OSA (obstructive sleep apnea)    Closed right ankle fracture 10/06/2018   Closed displaced trimalleolar fracture of right ankle 10/06/2018   Acute colitis 02/26/2018   Hypoxemia 02/24/2018   Dyspnea 11/26/2017   Acute on chronic anemia 11/26/2017   Elevated troponin 11/26/2017   Hyperkalemia 09/29/2017   Acute on chronic respiratory failure with hypoxia (HCC) 09/28/2017   CAP (community acquired pneumonia) 09/26/2017   Volume overload 09/25/2017   Acute on chronic diastolic CHF (congestive heart failure) (HCC) 09/25/2017   Lactic acidosis 09/25/2017   Hypokalemia 09/25/2017   Breast cancer (HCC) 07/22/2017   SOB (shortness of breath)    Acute respiratory failure with hypoxia (HCC) 07/21/2017   Flatulence 10/25/2016   GERD (gastroesophageal reflux disease) 08/02/2016   Pyloric stenosis, acquired    Nausea with vomiting  Generalized abdominal pain    Chest pain, rule out acute myocardial infarction 07/04/2016   Constipation 07/04/2016   Nausea 07/04/2016   Essential hypertension 07/04/2016   HCAP (healthcare-associated pneumonia) 12/04/2015   ESRD on dialysis (HCC) 12/04/2015   Diabetes mellitus with end-stage renal disease (HCC) 12/04/2015    ONSET DATE: 10/10/24  REFERRING DIAG:  Diagnosis  S81.801D (ICD-10-CM) - Unspecified open wound, right lower leg, subsequent encounter    THERAPY DIAG:  Diagnosis  S81.801D (ICD-10-CM) - Unspecified open wound, right lower leg, subsequent encounter    Rationale for Evaluation and Treatment: Rehabilitation     Wound Therapy - 01/16/24 0001     Subjective Pt stated she had to cancel apt here on Tuesday as apt in [place] took too [place].  Reports increased pressure and present present.  Dressings have slid down LE.  Stated her back is  bothering her too today, had Xrays earlier today.    Patient and Family Stated Goals wounds to heal    Date of Onset 10/10/24    Prior Treatments various ointments and antibiotics    Pain Scale 0-10    Pain Score 5     Pain Type Acute pain    Pain Location Leg    Pain Orientation Left;Anterior;Posterior    Pain Descriptors / Indicators Burning;Sharp    Patients Stated Pain Goal 0    Pain Intervention(s) Emotional support;Repositioned    Evaluation and Treatment Procedures Explained to Patient/Family Yes    Evaluation and Treatment Procedures agreed to    Wound Properties Date First Assessed: 01/02/24 Time First Assessed: 1350 Wound Type: Other (Comment) Location: Leg Location Orientation: Right;Medial;Posterior Wound Description (Comments): multiple small wounds Present on Admission: Yes   Wound Image Images linked: 1    Dressing Type Gauze (Comment);Compression wrap   lotion/vaselne, medihoney, 2x2, ABD and cotton for shap, kerlix and coban   Dressing Changed Changed    Dressing Status Old drainage    Dressing Change Frequency PRN    Site / Wound Assessment Dusky;Brown    % Wound base Red or Granulating 50%    % Wound base Yellow/Fibrinous Exudate 40%    % Wound base Black/Eschar 10%    Peri-wound Assessment Edema;Other (Comment)   dry skin   Drainage Amount Scant    Drainage Description Serous    Treatment Cleansed;Debridement (Selective)    Selective Debridement (non-excisional) - Location wound bed    Selective Debridement (non-excisional) - Tools Used Forceps;Scalpel    Selective Debridement (non-excisional) - Tissue Removed very limited due to pain    Wound Therapy - Clinical Statement see below    Factors Delaying/Impairing Wound Healing Diabetes Mellitus;Multiple medical problems;Polypharmacy;Vascular compromise;Other (comment)   end stage kidney disease   Hydrotherapy Plan Debridement;Dressing change;Patient/family education    Wound Therapy - Frequency 2X / week    Wound  Therapy - Current Recommendations PT    Wound Plan debridement and dressing change    Dressing  lotion/vaseline, medihoney, 2x2, ABD and cotton for shape, kerlix and coban               PATIENT EDUCATION: Education details: keep dressing dry , remove if it becomes painful Person educated: Patient Education method: Explanation Education comprehension: verbalized understanding   HOME EXERCISE PROGRAM: N/A   GOALS: Goals reviewed with patient? No  SHORT TERM GOALS: Target date: 01/18/24  PT wounds to be 100% granulated Baseline: Goal status: INITIAL  2.  Pt pain to be no greater than a 3/10 Baseline:  Goal status: INITIAL   [place] TERM GOALS: Target date: 02/01/24  Pt wound to be healed  Baseline:  Goal status: INITIAL  2.  PT to have no pain in her Right LE  Baseline:  Goal status: INITIAL   ASSESSMENT:  CLINICAL IMPRESSION: 01/16/24:  Dressings has slid down though no bottle necking today.  Pt limited by pain with debridement, limiting ability to removal slough from wound bed.  Decreased eschar posterior with currently 3 wounds anterior and 3 posteriorly.  Continued with medihoney to assist with softening up remaining slough from wound bed.  Added ABD pad plus cotton for shape.  Continued with kerlix and coban for edema control.    Eval:Patient is a 54 y.o. female who was seen today for physical therapy evaluation and treatment for multiple non healing wounds on pt Rt LE.  Pt states that her port became infected in October, she was admitted to the hospital for IV antibiotics.  She did not have anything on her legs but a day after she got home she went to the MD as she had red spots all over her legs which turned into wounds.   She has tried medication and has been on antibiotics.  This helped the Lt leg but the Rt leg continues to have multiple wounds on it.   Ms.  Raben will benefit from skilled PT to create a more healing environment and debride her wounds to allow  healing to occur.    OBJECTIVE IMPAIRMENTS: increased edema, pain, and decreased skin integrity .   ACTIVITY LIMITATIONS: bathing and dressing   PERSONAL FACTORS: Fitness, Time since onset of injury/illness/exacerbation, and 1-2 comorbidities: DM, end stage renal failure  are also affecting patient's functional outcome.   REHAB POTENTIAL: Good  CLINICAL DECISION MAKING: Stable/uncomplicated  EVALUATION COMPLEXITY: Low  PLAN: PT FREQUENCY: 2x/week  PT DURATION: 4 weeks  PLANNED INTERVENTIONS: 97535- Self Care, 69629- Manual therapy, 97597- Wound care (first 20 sq cm), and Patient/Family education  PLAN FOR NEXT SESSION: continue to debride away eschar and necrotic tissue to increase approximation  Becky Sax, LPTA/CLT; CBIS [phone]  Juel Burrow, PTA 01/16/2024, 2:45 PM  01/16/2024, 2:45 PM

## 2024-01-17 DIAGNOSIS — D631 Anemia in chronic kidney disease: Secondary | ICD-10-CM | POA: Diagnosis not present

## 2024-01-17 DIAGNOSIS — Z992 Dependence on renal dialysis: Secondary | ICD-10-CM | POA: Diagnosis not present

## 2024-01-17 DIAGNOSIS — N186 End stage renal disease: Secondary | ICD-10-CM | POA: Diagnosis not present

## 2024-01-17 DIAGNOSIS — N2581 Secondary hyperparathyroidism of renal origin: Secondary | ICD-10-CM | POA: Diagnosis not present

## 2024-01-17 DIAGNOSIS — D509 Iron deficiency anemia, unspecified: Secondary | ICD-10-CM | POA: Diagnosis not present

## 2024-01-17 DIAGNOSIS — N250 Renal osteodystrophy: Secondary | ICD-10-CM | POA: Diagnosis not present

## 2024-01-18 DIAGNOSIS — D509 Iron deficiency anemia, unspecified: Secondary | ICD-10-CM | POA: Diagnosis not present

## 2024-01-18 DIAGNOSIS — N186 End stage renal disease: Secondary | ICD-10-CM | POA: Diagnosis not present

## 2024-01-18 DIAGNOSIS — Z992 Dependence on renal dialysis: Secondary | ICD-10-CM | POA: Diagnosis not present

## 2024-01-18 DIAGNOSIS — D631 Anemia in chronic kidney disease: Secondary | ICD-10-CM | POA: Diagnosis not present

## 2024-01-18 DIAGNOSIS — N250 Renal osteodystrophy: Secondary | ICD-10-CM | POA: Diagnosis not present

## 2024-01-18 DIAGNOSIS — N2581 Secondary hyperparathyroidism of renal origin: Secondary | ICD-10-CM | POA: Diagnosis not present

## 2024-01-20 DIAGNOSIS — D509 Iron deficiency anemia, unspecified: Secondary | ICD-10-CM | POA: Diagnosis not present

## 2024-01-20 DIAGNOSIS — N250 Renal osteodystrophy: Secondary | ICD-10-CM | POA: Diagnosis not present

## 2024-01-20 DIAGNOSIS — N186 End stage renal disease: Secondary | ICD-10-CM | POA: Diagnosis not present

## 2024-01-20 DIAGNOSIS — N2581 Secondary hyperparathyroidism of renal origin: Secondary | ICD-10-CM | POA: Diagnosis not present

## 2024-01-20 DIAGNOSIS — D631 Anemia in chronic kidney disease: Secondary | ICD-10-CM | POA: Diagnosis not present

## 2024-01-20 DIAGNOSIS — Z992 Dependence on renal dialysis: Secondary | ICD-10-CM | POA: Diagnosis not present

## 2024-01-21 ENCOUNTER — Ambulatory Visit (HOSPITAL_COMMUNITY): Payer: Medicare Other | Attending: Family Medicine

## 2024-01-21 ENCOUNTER — Ambulatory Visit (HOSPITAL_COMMUNITY): Payer: Medicare Other

## 2024-01-21 ENCOUNTER — Encounter (HOSPITAL_COMMUNITY): Payer: Self-pay

## 2024-01-21 DIAGNOSIS — S81801A Unspecified open wound, right lower leg, initial encounter: Secondary | ICD-10-CM | POA: Diagnosis not present

## 2024-01-21 DIAGNOSIS — M79661 Pain in right lower leg: Secondary | ICD-10-CM | POA: Diagnosis not present

## 2024-01-21 NOTE — Therapy (Signed)
 OUTPATIENT PHYSICAL THERAPY WOUNDCARE   Patient Name: Patricia [name] MRN: [id number] DOB:April 03, 1971, 53 y.o., female Today's Date: 01/21/2024   PCP: Rian Mace  REFERRING PROVIDER: Mace Lucie PARAS, PA-C  END OF SESSION:  PT End of Session - 01/21/24 0842     Visit Number 5    Number of Visits 8    Date for PT Re-Evaluation 02/01/24    Authorization Type medicare    PT Start Time 0717    PT Stop Time 0755    PT Time Calculation (min) 38 min    Activity Tolerance Patient tolerated treatment well    Behavior During Therapy WFL for tasks assessed/performed             Past Medical History:  Diagnosis Date   Anemia    Ankle fracture    Arthritis    Asthma    Blood transfusion without reported diagnosis    Breast cancer (HCC)    Cancer (HCC)    COVID    Diabetes mellitus without complication (HCC)    Dyspnea    End stage renal disease on dialysis (HCC)    M/W/F [facility] [place]   GERD (gastroesophageal reflux disease)    Hypertension    OSA (obstructive sleep apnea)    uses CPAP sometimes   Pneumonia    PONV (postoperative nausea and vomiting)    Wears glasses    Past Surgical History:  Procedure Laterality Date   A/V FISTULAGRAM Left 08/13/2023   Procedure: A/V Fistulagram;  Surgeon: Serene Gaile ORN, MD;  Location: [facility];  Service: Cardiovascular;  Laterality: Left;   ABDOMINAL HYSTERECTOMY     APPLICATION OF WOUND VAC Left 05/26/2023   Procedure: APPLICATION OF WOUND VAC;  Surgeon: Lanis Fonda BRAVO, MD;  Location: [facility] OR;  Service: Vascular;  Laterality: Left;   AV FISTULA PLACEMENT  11/2014   at [facility]   AV FISTULA REPAIR N/A 04/2021   BALLOON DILATION N/A 07/10/2016   Procedure: BALLOON DILATION;  Surgeon: Margo LITTIE Haddock, MD;  Location: [facility];  Service: Endoscopy;  Laterality: N/A;  Pyloric dilation   BREAST LUMPECTOMY     CATARACT EXTRACTION W/PHACO Right 05/24/2023   Procedure: CATARACT EXTRACTION PHACO AND INTRAOCULAR  LENS PLACEMENT (IOC);  Surgeon: Harrie Agent, MD;  Location: [facility];  Service: Ophthalmology;  Laterality: Right;  CDE: 5.00   CESAREAN SECTION     CHOLECYSTECTOMY     COLONOSCOPY  02/2023   COLONOSCOPY WITH PROPOFOL  N/A 09/27/2016   Dr. Shaaron: Internal hemorrhoids repeat colonoscopy in 10 years   COLONOSCOPY WITH PROPOFOL  N/A 02/28/2023   Procedure: COLONOSCOPY WITH PROPOFOL ;  Surgeon: Shaaron Lamar HERO, MD;  Location: [facility];  Service: Endoscopy;  Laterality: N/A;  11:45 am, asa 3 dialysis pt   DIALYSIS/PERMA CATHETER INSERTION N/A 11/15/2023   Procedure: DIALYSIS/PERMA CATHETER INSERTION;  Surgeon: Serene Gaile ORN, MD;  Location: [facility] INVASIVE CV LAB;  Service: Cardiovascular;  Laterality: N/A;   DILATION AND CURETTAGE OF UTERUS     ESOPHAGOGASTRODUODENOSCOPY N/A 07/10/2016   Dr.Fields- normal esophagus, gastric stenosis was found at the pylorus, gastritis on bx, normal examined duodenun   ESOPHAGOGASTRODUODENOSCOPY (EGD) WITH PROPOFOL  N/A 11/21/2022   Procedure: ESOPHAGOGASTRODUODENOSCOPY (EGD) WITH PROPOFOL ;  Surgeon: Eartha Angelia Sieving, MD;  Location: [facility];  Service: Gastroenterology;  Laterality: N/A;   EXTERNAL FIXATION REMOVAL Right 10/29/2018   Procedure: REMOVAL RIGHT ANKLE BIOMET ZIMMER EXTERNAL FIXATOR, SHORT LEG CAST APPLICATION;  Surgeon: Barbarann Oneil BROCKS, MD;  Location: [facility] OR;  Service: Orthopedics;  Laterality: Right;   INSERTION OF DIALYSIS CATHETER  05/26/2023   Procedure: INSERTION OF Left internal jugular DIALYSIS CATHETER;  Surgeon: Lanis Fonda BRAVO, MD;  Location: [facility] OR;  Service: Vascular;;   IR FLUORO GUIDE CV LINE LEFT  08/28/2023   IR FLUORO GUIDE CV LINE RIGHT  09/24/2023   IR PATIENT EVAL TECH 0-60 MINS  09/24/2023   IR US  GUIDE VASC ACCESS LEFT  08/28/2023   IR US  GUIDE VASC ACCESS RIGHT  09/24/2023   MASTECTOMY     left sided   ORIF ANKLE FRACTURE Right 10/06/2018   Procedure: OPEN REDUCTION INTERNAL FIXATION (ORIF) RIGHT ANKLE TRIMALLEOLAR;   Surgeon: Barbarann Oneil BROCKS, MD;  Location: [facility] OR;  Service: Orthopedics;  Laterality: Right;   PERIPHERAL VASCULAR BALLOON ANGIOPLASTY  08/13/2023   Procedure: PERIPHERAL VASCULAR BALLOON ANGIOPLASTY;  Surgeon: Serene Gaile ORN, MD;  Location: [facility];  Service: Cardiovascular;;   REVISON OF ARTERIOVENOUS FISTULA Left 05/26/2023   Procedure: REVISON OF ARTERIOVENOUS FISTULA LEFT ARM HEMATOMA WASH OUT;  Surgeon: Lanis Fonda BRAVO, MD;  Location: [facility] OR;  Service: Vascular;  Laterality: Left;   RIGHT/LEFT HEART CATH AND CORONARY ANGIOGRAPHY N/A 11/27/2022   Procedure: RIGHT/LEFT HEART CATH AND CORONARY ANGIOGRAPHY;  Surgeon: Jordan, Peter M, MD;  Location: [facility] INVASIVE CV LAB;  Service: Cardiovascular;  Laterality: N/A;   TEE WITHOUT CARDIOVERSION N/A 09/18/2023   Procedure: TRANSESOPHAGEAL ECHOCARDIOGRAM (TEE);  Surgeon: Debera Jayson MATSU, MD;  Location: [facility];  Service: Cardiovascular;  Laterality: N/A;   Patient Active Problem List   Diagnosis Date Noted   Staphylococcus aureus bacteremia 09/18/2023   Bacteremia 09/16/2023   Respiratory failure (HCC) 09/14/2023   Severe sepsis (HCC) 09/14/2023   Type 2 diabetes mellitus with hyperlipidemia (HCC) 09/14/2023   Acute anemia 06/03/2023   Complication of AV dialysis fistula 05/26/2023   Anemia of chronic disease 05/26/2023   Mixed hyperlipidemia 03/23/2023   Normocytic anemia 02/12/2023   Abdominal pain 02/12/2023   Chronic respiratory failure with hypoxia (HCC) 01/15/2023   Other specified disorders of bone density and structure, other site 01/02/2023   Acute on chronic hypoxic respiratory failure (HCC) 12/10/2022   Precordial chest pain 11/26/2022   ESRD needing dialysis (HCC) 11/20/2022   Class 3 obesity 11/19/2022   BOOP (bronchiolitis obliterans with organizing pneumonia) (HCC) 11/19/2022   Bronchiolitis obliterans (HCC) 11/19/2022   Post-traumatic osteoarthritis, right ankle and foot 08/31/2022   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    Elevated alkaline phosphatase level 05/24/2022   Right foot sprain 12/20/2020   Contusion of left shoulder 12/20/2020   Syncope 08/03/2020   Acute pulmonary edema (HCC) 01/11/2020   OSA (obstructive sleep apnea)    Closed right ankle fracture 10/06/2018   Closed displaced trimalleolar fracture of right ankle 10/06/2018   Acute colitis 02/26/2018   Hypoxemia 02/24/2018   Dyspnea 11/26/2017   Acute on chronic anemia 11/26/2017   Elevated troponin 11/26/2017   Hyperkalemia 09/29/2017   Acute on chronic respiratory failure with hypoxia (HCC) 09/28/2017   CAP (community acquired pneumonia) 09/26/2017   Volume overload 09/25/2017   Acute on chronic diastolic CHF (congestive heart failure) (HCC) 09/25/2017   Lactic acidosis 09/25/2017   Hypokalemia 09/25/2017   Breast cancer (HCC) 07/22/2017   SOB (shortness of breath)    Acute respiratory failure with hypoxia (HCC) 07/21/2017   Flatulence 10/25/2016   GERD (gastroesophageal reflux disease) 08/02/2016   Pyloric stenosis, acquired    Nausea with vomiting  Generalized abdominal pain    Chest pain, rule out acute myocardial infarction 07/04/2016   Constipation 07/04/2016   Nausea 07/04/2016   Essential hypertension 07/04/2016   HCAP (healthcare-associated pneumonia) 12/04/2015   ESRD on dialysis (HCC) 12/04/2015   Diabetes mellitus with end-stage renal disease (HCC) 12/04/2015    ONSET DATE: 10/10/24  REFERRING DIAG:  Diagnosis  S81.801D (ICD-10-CM) - Unspecified open wound, right lower leg, subsequent encounter    THERAPY DIAG:  Diagnosis  S81.801D (ICD-10-CM) - Unspecified open wound, right lower leg, subsequent encounter    Rationale for Evaluation and Treatment: Rehabilitation     Wound Therapy - 01/21/24 0001     Subjective 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  Reports she is in pain, none with wounds.  Had xray done on back last week, no results yet.    Patient  and Family Stated Goals wounds to heal    Date of Onset 10/10/24    Prior Treatments various ointments and antibiotics    Pain Scale 0-10    Pain Score 0-No pain    Evaluation and Treatment Procedures Explained to Patient/Family Yes    Evaluation and Treatment Procedures agreed to    Wound Properties Date First Assessed: 01/02/24 Time First Assessed: 1350 Wound Type: Other (Comment) Location: Leg Location Orientation: Right;Medial;Posterior Wound Description (Comments): multiple small wounds Present on Admission: Yes   Wound Image Images linked: 2    Dressing Type Impregnated gauze (bismuth);Abdominal pads;Gauze (Comment);Compression wrap   lotion/vaseline, xeroform, ABD pad and cotton for shape, kerlix and coban with netting   Dressing Changed Changed    Dressing Status Old drainage    Dressing Change Frequency PRN    Site / Wound Assessment Dry;Yellow;Granulation tissue    % Wound base Red or Granulating 65%    % Wound base Yellow/Fibrinous Exudate 35%    Peri-wound Assessment Edema   dry skin   Drainage Amount Scant    Drainage Description Serous    Treatment Cleansed;Debridement (Selective)    Selective Debridement (non-excisional) - Location wound bed    Selective Debridement (non-excisional) - Tools Used Forceps;Scalpel    Selective Debridement (non-excisional) - Tissue Removed Devitalized tissue, dry skin    Wound Therapy - Clinical Statement see below    Factors Delaying/Impairing Wound Healing Diabetes Mellitus;Multiple medical problems;Polypharmacy;Vascular compromise;Other (comment)   end stage kidney disease   Hydrotherapy Plan Debridement;Dressing change;Patient/family education    Wound Therapy - Frequency 2X / week    Wound Therapy - Current Recommendations PT    Wound Plan debridement and dressing change    Dressing  lotion/vaseline, xerofrom, 2x2, ABD and cotton for shape, kerlix and coban               PATIENT EDUCATION: Education details: keep dressing dry ,  remove if it becomes painful Person educated: Patient Education method: Explanation Education comprehension: verbalized understanding   HOME EXERCISE PROGRAM: N/A   GOALS: Goals reviewed with patient? No  SHORT TERM GOALS: Target date: 01/18/24  PT wounds to be 100% granulated Baseline: Goal status: INITIAL  2.  Pt pain to be no greater than a 3/10 Baseline:  Goal status: INITIAL   [place] TERM GOALS: Target date: 02/01/24  Pt wound to be healed  Baseline:  Goal status: INITIAL  2.  PT to have no pain in her Right LE  Baseline:  Goal status: INITIAL   ASSESSMENT:  CLINICAL IMPRESSION: 01/21/24:  Dressings did not slid  down as bad as previous, no bottle neck present.  Pt with improved tolerance for debridement this session.  Decreased drainage and medihoney very adherent to inferior wound on anterior aspect.  Cleansed and moisturized LE well.  Selective debridement for removal of slough from wound bed and dry skin perimeter.  Changed to xeroform and continued with kerlix and coban with additonal cotton and ABD pads for shaping to reduce sliding down.  Reports of comfort at EOS.  Eval:Patient is a 53 y.o. female who was seen today for physical therapy evaluation and treatment for multiple non healing wounds on pt Rt LE.  Pt states that her port became infected in October, she was admitted to the hospital for IV antibiotics.  She did not have anything on her legs but a day after she got home she went to the MD as she had red spots all over her legs which turned into wounds.   She has tried medication and has been on antibiotics.  This helped the Lt leg but the Rt leg continues to have multiple wounds on it.   Ms.  Level will benefit from skilled PT to create a more healing environment and debride her wounds to allow healing to occur.    OBJECTIVE IMPAIRMENTS: increased edema, pain, and decreased skin integrity .   ACTIVITY LIMITATIONS: bathing and dressing   PERSONAL FACTORS:  Fitness, Time since onset of injury/illness/exacerbation, and 1-2 comorbidities: DM, end stage renal failure  are also affecting patient's functional outcome.   REHAB POTENTIAL: Good  CLINICAL DECISION MAKING: Stable/uncomplicated  EVALUATION COMPLEXITY: Low  PLAN: PT FREQUENCY: 2x/week  PT DURATION: 4 weeks  PLANNED INTERVENTIONS: 97535- Self Care, 02859- Manual therapy, 97597- Wound care (first 20 sq cm), and Patient/Family education  PLAN FOR NEXT SESSION: continue to debride away eschar and necrotic tissue to increase approximation  Augustin Mclean, LPTA/CLT; CBIS [phone]  Mclean Augustin Amble, PTA 01/21/2024, 10:09 AM  01/21/2024, 10:09 AM

## 2024-01-21 NOTE — Progress Notes (Signed)
[facility] - Clinic Note  02/04/2024   CHIEF COMPLAINT Patient presents for Retina Follow Up  HISTORY OF PRESENT ILLNESS: Patricia [name] is a 53 y.o. female who presents to the clinic today for:  HPI     Retina Follow Up   Patient presents with  Diabetic Retinopathy.  In both eyes.  Severity is moderate.  Duration of 4 weeks.  Since onset it is stable.  I, the attending physician,  performed the HPI with the patient and updated documentation appropriately.        Comments   Patient feels the vision is about the same after having a YAG OD w/ Dr. Lurline Idol. She is not using eye drops. Her blood sugar was 68.      Last edited by Rennis Chris, MD on 02/05/2024  3:28 PM.    Pt states vision has improved  Referring physician: [name], MD [address] [place],  [place] [address]  HISTORICAL INFORMATION:  Selected notes from the MEDICAL RECORD NUMBER Referred by Dr. June Leap for macular edema LEE:  Ocular Hx- PMH-   CURRENT MEDICATIONS: Current Outpatient Medications (Ophthalmic Drugs)  Medication Sig   Polyvinyl Alcohol-Povidone (REFRESH OP) Place 1 drop into both eyes daily as needed (dry eyes).   No current facility-administered medications for this visit. (Ophthalmic Drugs)   Current Outpatient Medications (Other)  Medication Sig   acetaminophen (TYLENOL) 325 MG tablet Take 650 mg by mouth every 6 (six) hours as needed for mild pain or fever.   albuterol (VENTOLIN HFA) 108 (90 Base) MCG/ACT inhaler Inhale 2 puffs into the lungs every 6 (six) hours as needed for wheezing or shortness of breath.   ALPRAZolam (XANAX) 0.5 MG tablet Take 0.5 mg by mouth at bedtime as needed for anxiety or sleep.   aspirin EC 81 MG tablet Take 1 tablet (81 mg total) by mouth daily. Swallow whole.   ceFAZolin (ANCEF) 2-4 GM/100ML-% IVPB Inject 100 mLs (2 g total) into the vein every Monday, Wednesday, and Friday with hemodialysis. Last dose on 10/30/23 on dialysis    cetirizine (ZYRTEC) 10 MG tablet Take 1 tablet (10 mg total) by mouth daily.   Cholecalciferol (VITAMIN D-3 PO) Take 1 tablet by mouth daily.   cinacalcet (SENSIPAR) 30 MG tablet Take 30 mg by mouth daily.   docusate sodium (COLACE) 100 MG capsule Take 100 mg by mouth daily.   famotidine (PEPCID) 20 MG tablet Take 1 tablet (20 mg total) by mouth daily. As needed for breakthrough reflux.   ferric citrate (AURYXIA) 1 GM 210 MG(Fe) tablet Take 420 mg by mouth 2 (two) times daily with a meal. With Breakfast & with supper   glipiZIDE (GLUCOTROL) 5 MG tablet Take 1 tablet (5 mg total) by mouth daily before breakfast.   ibuprofen (ADVIL) 800 MG tablet Take 800 mg by mouth every 8 (eight) hours as needed.   insulin glargine, 1 Unit Dial, (TOUJEO SOLOSTAR) 300 UNIT/ML Solostar Pen Inject 30 Units into the skin at bedtime.   ipratropium (ATROVENT) 0.06 % nasal spray Place 2 sprays into both nostrils daily as needed for rhinitis.   irbesartan (AVAPRO) 75 MG tablet Take 75 mg by mouth See admin instructions. Take 75 mg at night on non-dialysis days (Sunday, Tuesday, Thursday, Saturday)   labetalol (NORMODYNE) 100 MG tablet Take 100 mg by mouth 2 (two) times daily.   linaclotide (LINZESS) 290 MCG CAPS capsule Take 290 mcg by mouth daily before breakfast.   montelukast (  SINGULAIR) 10 MG tablet Take 1 tablet (10 mg total) by mouth at bedtime.   multivitamin (RENA-VIT) TABS tablet Take 1 tablet by mouth daily.   NIFEdipine (ADALAT CC) 30 MG 24 hr tablet Take 30 mg by mouth See admin instructions. Take 30 mg at night on non-dialysis days (Sunday, Tuesday, Thursday, Saturday)   ondansetron (ZOFRAN-ODT) 4 MG disintegrating tablet Take 1 tablet (4 mg total) by mouth every 8 (eight) hours as needed for nausea or vomiting.   oxyCODONE (OXY IR/ROXICODONE) 5 MG immediate release tablet Take 1 tablet (5 mg total) by mouth every 6 (six) hours as needed for moderate pain.   OXYGEN Inhale 2 L into the lungs continuous.    pantoprazole (PROTONIX) 40 MG tablet Take 1 tablet (40 mg total) by mouth daily. 30 minutes before breakfast   rOPINIRole (REQUIP XL) 2 MG 24 hr tablet Take 2 mg by mouth at bedtime.    rosuvastatin (CRESTOR) 20 MG tablet Take 20 mg by mouth daily.   sevelamer carbonate (RENVELA) 800 MG tablet Take 1,600-2,400 mg by mouth See admin instructions. Take 1600 mg at breakfast, 2400 mg at lunch and 1600 mg at dinner   traMADol (ULTRAM) 50 MG tablet Take 50 mg by mouth 2 (two) times daily.   No current facility-administered medications for this visit. (Other)   REVIEW OF SYSTEMS: ROS   Positive for: Endocrine, Eyes, Respiratory Negative for: Constitutional, Gastrointestinal, Neurological, Skin, Genitourinary, Musculoskeletal, HENT, Cardiovascular, Psychiatric, Allergic/Imm, Heme/Lymph Last edited by Charlette Caffey, COT on 02/04/2024  7:42 AM.      ALLERGIES Allergies  Allergen Reactions   Norvasc [Amlodipine Besylate] Rash and Other (See Comments)    Dizziness    Reglan [Metoclopramide] Other (See Comments)    Hallucinations    PAST MEDICAL HISTORY Past Medical History:  Diagnosis Date   Anemia    Ankle fracture    Arthritis    Asthma    Blood transfusion without reported diagnosis    Breast cancer (HCC)    Cancer (HCC)    COVID    Diabetes mellitus without complication (HCC)    Dyspnea    End stage renal disease on dialysis (HCC)    M/W/F [facility] in [place]   GERD (gastroesophageal reflux disease)    Hypertension    OSA (obstructive sleep apnea)    uses CPAP sometimes   Pneumonia    PONV (postoperative nausea and vomiting)    Wears glasses    Past Surgical History:  Procedure Laterality Date   A/V FISTULAGRAM Left 08/13/2023   Procedure: A/V Fistulagram;  Surgeon: Nada Libman, MD;  Location: [facility];  Service: Cardiovascular;  Laterality: Left;   ABDOMINAL HYSTERECTOMY     APPLICATION OF WOUND VAC Left 05/26/2023   Procedure: APPLICATION OF WOUND  VAC;  Surgeon: Victorino Sparrow, MD;  Location: [facility] OR;  Service: Vascular;  Laterality: Left;   AV FISTULA PLACEMENT  11/2014   at [facility]   AV FISTULA REPAIR N/A 04/2021   BALLOON DILATION N/A 07/10/2016   Procedure: BALLOON DILATION;  Surgeon: West Bali, MD;  Location: [facility];  Service: Endoscopy;  Laterality: N/A;  Pyloric dilation   BREAST LUMPECTOMY     CATARACT EXTRACTION W/PHACO Right 05/24/2023   Procedure: CATARACT EXTRACTION PHACO AND INTRAOCULAR LENS PLACEMENT (IOC);  Surgeon: [name], MD;  Location: [facility] ORS;  Service: Ophthalmology;  Laterality: Right;  CDE: 5.00   CESAREAN SECTION     CHOLECYSTECTOMY  COLONOSCOPY  02/2023   COLONOSCOPY WITH PROPOFOL N/A 09/27/2016   Dr. Jena Gauss: Internal hemorrhoids repeat colonoscopy in 10 years   COLONOSCOPY WITH PROPOFOL N/A 02/28/2023   Procedure: COLONOSCOPY WITH PROPOFOL;  Surgeon: Corbin Ade, MD;  [facility]: [facility];  Service: Endoscopy;  Laterality: N/A;  11:45 am, asa 3 dialysis pt   DIALYSIS/PERMA CATHETER INSERTION N/A 11/15/2023   Procedure: DIALYSIS/PERMA CATHETER INSERTION;  Surgeon: Nada Libman, MD;  Location: [facility] INVASIVE CV LAB;  Service: Cardiovascular;  Laterality: N/A;   DILATION AND CURETTAGE OF UTERUS     ESOPHAGOGASTRODUODENOSCOPY N/A 07/10/2016   Dr.Fields- normal esophagus, gastric stenosis was found at the pylorus, gastritis on bx, normal examined duodenun   ESOPHAGOGASTRODUODENOSCOPY (EGD) WITH PROPOFOL N/A 11/21/2022   Procedure: ESOPHAGOGASTRODUODENOSCOPY (EGD) WITH PROPOFOL;  Surgeon: Dolores Frame, MD;  [facility]: [facility];  Service: Gastroenterology;  Laterality: N/A;   EXTERNAL FIXATION REMOVAL Right 10/29/2018   Procedure: REMOVAL RIGHT ANKLE BIOMET ZIMMER EXTERNAL FIXATOR, SHORT LEG CAST APPLICATION;  Surgeon: Eldred Manges, MD;  Location: [facility] OR;  Service: Orthopedics;  Laterality: Right;   INSERTION OF DIALYSIS CATHETER  05/26/2023   Procedure:  INSERTION OF Left internal jugular DIALYSIS CATHETER;  Surgeon: Victorino Sparrow, MD;  Location: [facility] OR;  Service: Vascular;;   IR FLUORO GUIDE CV LINE LEFT  08/28/2023   IR FLUORO GUIDE CV LINE RIGHT  09/24/2023   IR PATIENT EVAL TECH 0-60 MINS  09/24/2023   IR US GUIDE VASC ACCESS LEFT  08/28/2023   IR US GUIDE VASC ACCESS RIGHT  09/24/2023   MASTECTOMY     left sided   ORIF ANKLE FRACTURE Right 10/06/2018   Procedure: OPEN REDUCTION INTERNAL FIXATION (ORIF) RIGHT ANKLE TRIMALLEOLAR;  Surgeon: Eldred Manges, MD;  Location: [facility] OR;  Service: Orthopedics;  Laterality: Right;   PERIPHERAL VASCULAR BALLOON ANGIOPLASTY  08/13/2023   Procedure: PERIPHERAL VASCULAR BALLOON ANGIOPLASTY;  Surgeon: Nada Libman, MD;  [facility]: [facility];  Service: Cardiovascular;;   REFRACTIVE SURGERY Right 01/30/2024   REVISON OF ARTERIOVENOUS FISTULA Left 05/26/2023   Procedure: REVISON OF ARTERIOVENOUS FISTULA LEFT ARM HEMATOMA WASH OUT;  Surgeon: Victorino Sparrow, MD;  Location: [facility] OR;  Service: Vascular;  Laterality: Left;   RIGHT/LEFT HEART CATH AND CORONARY ANGIOGRAPHY N/A 11/27/2022   Procedure: RIGHT/LEFT HEART CATH AND CORONARY ANGIOGRAPHY;  Surgeon: Swaziland, Peter M, MD;  Location: [facility] INVASIVE CV LAB;  Service: Cardiovascular;  Laterality: N/A;   TEE WITHOUT CARDIOVERSION N/A 09/18/2023   Procedure: TRANSESOPHAGEAL ECHOCARDIOGRAM (TEE);  Surgeon: Jonelle Sidle, MD;  Location: [facility];  Service: Cardiovascular;  Laterality: N/A;   FAMILY HISTORY Family History  Problem Relation Age of Onset   Diabetes Mellitus II Mother    Hypertension Mother    Heart block Mother    Hypertension Sister    Hypertension Sister    Colon cancer Neg Hx    SOCIAL HISTORY Social History   Tobacco Use   Smoking status: Never   Smokeless tobacco: Never  Vaping Use   Vaping status: Never Used  Substance Use Topics   Alcohol use: No    Alcohol/week: 0.0 standard drinks of alcohol   Drug use: No        OPHTHALMIC EXAM:  Base Eye Exam     Visual Acuity (Snellen - Linear)       Right Left   Dist [place] 20/60 +2 20/40 +1   Dist ph [place] 20/50 +1 20/30 +1  Tonometry (Tonopen, 7:46 AM)       Right Left   Pressure 20 19         Pupils       Dark Light Shape React APD   Right 3 2 Round Brisk None   Left 3 2 Round Brisk None         Visual Fields       Left Right    Full Full         Extraocular Movement       Right Left    Full, Ortho Full, Ortho         Neuro/Psych     Oriented x3: Yes   Mood/Affect: Normal         Dilation     Both eyes: 1.0% Mydriacyl, 2.5% Phenylephrine @ 7:43 AM           Slit Lamp and Fundus Exam     Slit Lamp Exam       Right Left   Lids/Lashes Dermatochalasis - upper lid Dermatochalasis - upper lid   Conjunctiva/Sclera mild melanosis mild melanosis   Cornea mild arcus, well healed cataract wound mild arcus, 1+ Punctate epithelial erosions   Anterior Chamber deep and clear deep and clear   Iris Round and reactive, No NVI Round and dilated, No NVI   Lens PC IOL in good position -- open PC 2-3+ Nuclear sclerosis, 2-3+ Cortical cataract   Anterior Vitreous mild syneresis, old white VH settled inferiorly, scattered vitreous condensations, Posterior vitreous detachment mild syneresis, scattered fibrosis         Fundus Exam       Right Left   Disc Pink and Sharp, no NVD mild Pallor, Sharp rim, no NVD   C/D Ratio 0.2 0.3   Macula Blunted foveal reflex, scattered MA, focal pigment clump temporal fovea, trace cystic changes Flat, Blunted foveal reflex, central edema and exudates, scattered MA   Vessels attenuated, mild tortuosity attenuated, Tortuous   Periphery Attached, scattered PRP 360 with good fill in changes, scattered MA/DBH, peripheral schisis IT with old, white VH overlying Attached, 360 peripheral PRP with good fill in changes, scattered patches of pre-retinal heme turning white           IMAGING AND  PROCEDURES  Imaging and Procedures for 02/04/2024  OCT, Retina - OU - Both Eyes       Right Eye Quality was good. Central Foveal Thickness: 247. Progression has improved. Findings include normal foveal contour, no SRF, intraretinal hyper-reflective material, intraretinal fluid (Focal IRHM and trace cystic changes temporal fovea and macula; IT schisis - stable, +vitreous opacities -- slightly improved, partial PVD).   Left Eye Quality was good. Central Foveal Thickness: 438. Progression has been stable. Findings include no SRF, abnormal foveal contour, intraretinal hyper-reflective material, intraretinal fluid, vitreomacular adhesion (Persistent IRF/IRHM/edema temporal fovea and macula -- slightly increased; IN schisis not imaged today).   Notes *Images captured and stored on drive  Diagnosis / Impression:  +DME OU + peripheral retinoschisis OU OD: Focal IRHM and trace cystic changes temporal fovea and macula; IT schisis - stable, +vitreous opacities -- slightly improved, partial PVD  OS: Persistent IRF/IRHM/edema temporal fovea and macula -- slightly increased; IN schisis not imaged today  Clinical management:  See below  Abbreviations: NFP - Normal foveal profile. CME - cystoid macular edema. PED - pigment epithelial detachment. IRF - intraretinal fluid. SRF - subretinal fluid. EZ - ellipsoid zone. ERM - epiretinal membrane. ORA - outer retinal  atrophy. ORT - outer retinal tubulation. SRHM - subretinal hyper-reflective material. IRHM - intraretinal hyper-reflective material      Intravitreal Injection, Pharmacologic Agent - OD - Right Eye       Time Out 02/04/2024. 9:23 AM. Confirmed correct patient, procedure, site, and patient consented.   Anesthesia Topical anesthesia was used. Anesthetic medications included Lidocaine 2%, Proparacaine 0.5%.   Procedure Preparation included 5% betadine to ocular surface, eyelid speculum. A (32g) needle was used.   Injection: 2 mg  aflibercept 2 MG/0.05ML   Route: Intravitreal, Site: Right Eye   NDC: L6038910, Lot: [id number], Expiration date: 05/16/2025, Waste: 0 mL   Post-op Post injection exam found visual acuity of at least counting fingers. The patient tolerated the procedure well. There were no complications. The patient received written and verbal post procedure care education. Post injection medications were not given.      Intravitreal Injection, Pharmacologic Agent - OS - Left Eye       Time Out 02/04/2024. 9:23 AM. Confirmed correct patient, procedure, site, and patient consented.   Anesthesia Topical anesthesia was used. Anesthetic medications included Lidocaine 2%, Proparacaine 0.5%.   Procedure Preparation included 5% betadine to ocular surface, eyelid speculum. A (32g) needle was used.   Injection: 2 mg aflibercept 2 MG/0.05ML   Route: Intravitreal, Site: Left Eye   NDC: L6038910, Lot: [id number], Expiration date: 10/16/2024, Waste: 0 mL   Post-op Post injection exam found visual acuity of at least counting fingers. The patient tolerated the procedure well. There were no complications. The patient received written and verbal post procedure care education.      Fluorescein Angiography Optos (Transit OS)       Right Eye Progression has improved. Early phase findings include delayed filling, staining, microaneurysm, vascular perfusion defect. Mid/Late phase findings include leakage, microaneurysm, vascular perfusion defect (Clusters of perivascular leakage inferior and temporal midzone -- ?early NV -- improved, no NV).   Left Eye Progression has improved. Early phase findings include staining, microaneurysm, vascular perfusion defect. Mid/Late phase findings include leakage, staining, microaneurysm, vascular perfusion defect (Clusters of NVE temporal midzone -- improved).   Notes **Images stored on drive**  Impression: PDR OU OD: Clusters of perivascular leakage inferior and  temporal midzone -- ?early NV -- improved, no NV OS: Clusters of NVE temporal midzone -- improved           ASSESSMENT/PLAN:   ICD-10-CM   1. Proliferative diabetic retinopathy of both eyes with macular edema associated with type 2 diabetes mellitus (HCC)  E11.3513 OCT, Retina - OU - Both Eyes    Intravitreal Injection, Pharmacologic Agent - OD - Right Eye    Intravitreal Injection, Pharmacologic Agent - OS - Left Eye    aflibercept (EYLEA) SOLN 2 mg    aflibercept (EYLEA) SOLN 2 mg    2. 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 Essential hypertension  I10     5. Hypertensive retinopathy of both eyes  H35.033 Fluorescein Angiography Optos (Transit OS)    6. Pseudophakia  Z96.1     7. Combined forms of age-related cataract of left eye  H25.812      1,2. Proliferative diabetic retinopathy, both eyes  - A1C 6.9 (11.27.24)  - delayed f/u from 4 weeks to 9 weeks (08.20.24-10.22.24) due to being in the hospital  - s/p IVA OS #1 (05.14.24) - s/p IVA OD #1 (05.16.24) - s/p IVA OU # 2 (  06.25.24), #3 (07.23.24), #4 (08.20.24), #5 (10.22.24), #6 (11.19.24), #7 (12.17.24) - s/p IVE OU #1 (01.21.25)  - s/p PRP OS (05.21.24)  - s/p PRP OD (08.01.24)  - previously followed with Dr. Dorette Grate at [facility], but had been lost to f/u since 10.28.2020 - h/o PRP OU; had never received anti-VEGF injxns prior to here, per report - exam shows old white VH + some red subhyaloid heme OD; OS with focal clusters of preretinal heme turning white -- nasal and temporal periphery - FA (05.14.24) shows OD: Clusters of perivascular leakage inferior and temporal midzone -- ?early NV; OS: Clusters of NVE temporal midzone -- pt would benefit from fill in PRP OU (OS first--done 05.21.24) - FA (02.18.25) shows OD: Clusters of perivascular leakage inferior and temporal midzone -- ?early NV -- improved, no NV; OS: Clusters of NVE temporal midzone -- improved - OCT shows OD: Focal  IRHM and trace cystic changes temporal fovea and macula; IT schisis - stable, +vitreous opacities -- slightly improved, partial PVD; OS: Persistent IRF/IRHM/edema temporal fovea and macula -- slightly increased; IN schisis not imaged today at 4 weeks - recommend IVE OU #2 today 2.18.25 w/ f/u in 4 wks - pt wishes to proceed with injections - RBA of procedure discussed, questions answered - Eylea informed consent obtained and signed 01.21.25 (OU) - see procedure note - Eylea approved for 2025 - f/u 4 weeks, DFE, OCT, possible injection(s)   3. Retinoschisis OU  - peripheral retinoschisis confirmed by OCT  - OD -- inferotemporal periphery  - OS -- nasal periphery  - stable, monitor  4,5. Hypertensive retinopathy OU - discussed importance of tight BP control - monitor  6. Pseudophakia OD  - s/p CE/IOL OD (Dr. June Leap, 06.07.24)  - IOL in good position  - s/p YAG cap OD (02.12.25)  - monitor  7. Mixed Cataract OS - The symptoms of cataract, surgical options, and treatments and risks were discussed with patient. - discussed diagnosis and progression - under the expert management of Dr. June Leap - clear from a retina standpoint to proceed with cataract surgery when pt and surgeon are ready  Ophthalmic Meds Ordered this visit:  Meds ordered this encounter  Medications   aflibercept (EYLEA) SOLN 2 mg   aflibercept (EYLEA) SOLN 2 mg     Return in about 4 weeks (around 03/03/2024) for f/u PDR OU, DFE, OCT, Possible Injxn.  There are no Patient Instructions on file for this visit.  Explained the diagnoses, plan, and follow up with the patient and they expressed understanding.  Patient expressed understanding of the importance of proper follow up care.   This document serves as a record of services personally performed by Karie Chimera, MD, PhD. It was created on their behalf by Charlette Caffey, COT an ophthalmic technician. The creation of this record is the provider's dictation  and/or activities during the visit.    Electronically signed by:  Charlette Caffey, COT  02/05/24 3:33 PM  This document serves as a record of services personally performed by Karie Chimera, MD, PhD. It was created on their behalf by Glee Arvin. Manson Passey, OA an ophthalmic technician. The creation of this record is the provider's dictation and/or activities during the visit.    Electronically signed by: Glee Arvin. Manson Passey, OA 02/05/24 3:33 PM   Karie Chimera, M.D., Ph.D. Diseases & Surgery of the Retina and Vitreous [facility]  I have reviewed the above documentation for accuracy and completeness,  and I agree with the above. Karie Chimera, M.D., Ph.D. 02/05/24 3:33 PM    Abbreviations: M myopia (nearsighted); A astigmatism; H hyperopia (farsighted); P presbyopia; Mrx spectacle prescription;  CTL contact lenses; OD right eye; OS left eye; OU both eyes  XT exotropia; ET esotropia; PEK punctate epithelial keratitis; PEE punctate epithelial erosions; DES dry eye syndrome; MGD meibomian gland dysfunction; ATs artificial tears; PFAT's preservative free artificial tears; NSC nuclear sclerotic cataract; PSC posterior subcapsular cataract; ERM epi-retinal membrane; PVD posterior vitreous detachment; RD retinal detachment; DM diabetes mellitus; DR diabetic retinopathy; NPDR non-proliferative diabetic retinopathy; PDR proliferative diabetic retinopathy; CSME clinically significant macular edema; DME diabetic macular edema; dbh dot blot hemorrhages; CWS cotton wool spot; POAG primary open angle glaucoma; C/D cup-to-disc ratio; HVF humphrey visual field; GVF goldmann visual field; OCT optical coherence tomography; IOP intraocular pressure; BRVO Branch retinal vein occlusion; CRVO central retinal vein occlusion; CRAO central retinal artery occlusion; BRAO branch retinal artery occlusion; RT retinal tear; SB scleral buckle; PPV pars plana vitrectomy; VH Vitreous hemorrhage; PRP panretinal  laser photocoagulation; IVK intravitreal kenalog; VMT vitreomacular traction; MH Macular hole;  NVD neovascularization of the disc; NVE neovascularization elsewhere; AREDS age related eye disease study; ARMD age related macular degeneration; POAG primary open angle glaucoma; EBMD epithelial/anterior basement membrane dystrophy; ACIOL anterior chamber intraocular lens; IOL intraocular lens; PCIOL posterior chamber intraocular lens; Phaco/IOL phacoemulsification with intraocular lens placement; PRK photorefractive keratectomy; LASIK laser assisted in situ keratomileusis; HTN hypertension; DM diabetes mellitus; COPD chronic obstructive pulmonary disease

## 2024-01-22 DIAGNOSIS — D631 Anemia in chronic kidney disease: Secondary | ICD-10-CM | POA: Diagnosis not present

## 2024-01-22 DIAGNOSIS — N250 Renal osteodystrophy: Secondary | ICD-10-CM | POA: Diagnosis not present

## 2024-01-22 DIAGNOSIS — D509 Iron deficiency anemia, unspecified: Secondary | ICD-10-CM | POA: Diagnosis not present

## 2024-01-22 DIAGNOSIS — N186 End stage renal disease: Secondary | ICD-10-CM | POA: Diagnosis not present

## 2024-01-22 DIAGNOSIS — N2581 Secondary hyperparathyroidism of renal origin: Secondary | ICD-10-CM | POA: Diagnosis not present

## 2024-01-22 DIAGNOSIS — Z992 Dependence on renal dialysis: Secondary | ICD-10-CM | POA: Diagnosis not present

## 2024-01-23 ENCOUNTER — Ambulatory Visit: Payer: Medicare Other | Admitting: Gastroenterology

## 2024-01-23 ENCOUNTER — Ambulatory Visit (HOSPITAL_COMMUNITY): Payer: Medicare Other | Admitting: Physical Therapy

## 2024-01-23 ENCOUNTER — Encounter: Payer: Self-pay | Admitting: Gastroenterology

## 2024-01-23 VITALS — BP 156/65 | HR 85 | Temp 98.7°F | Ht 63.0 in | Wt 255.4 lb

## 2024-01-23 DIAGNOSIS — S81801A Unspecified open wound, right lower leg, initial encounter: Secondary | ICD-10-CM

## 2024-01-23 DIAGNOSIS — K5900 Constipation, unspecified: Secondary | ICD-10-CM | POA: Diagnosis not present

## 2024-01-23 DIAGNOSIS — M79661 Pain in right lower leg: Secondary | ICD-10-CM | POA: Diagnosis not present

## 2024-01-23 DIAGNOSIS — K219 Gastro-esophageal reflux disease without esophagitis: Secondary | ICD-10-CM

## 2024-01-23 MED ORDER — PANTOPRAZOLE SODIUM 40 MG PO TBEC: 40.0000 mg | DELAYED_RELEASE_TABLET | Freq: Every day | ORAL | 3 refills | Status: AC

## 2024-01-23 MED ORDER — FAMOTIDINE 20 MG PO TABS: 20.0000 mg | ORAL_TABLET | Freq: Every day | ORAL | 5 refills | Status: AC

## 2024-01-23 NOTE — Therapy (Signed)
 OUTPATIENT PHYSICAL THERAPY WOUNDCARE   Patient Name: Patricia [name] MRN: [id number] DOB:October 02, 1971, 53 y.o., female Today's Date: 01/23/2024   PCP: Rian Mace  REFERRING PROVIDER: Mace Lucie PARAS, PA-C  END OF SESSION:  PT End of Session - 01/23/24 1431     Visit Number 6    Number of Visits 8    Date for PT Re-Evaluation 02/01/24    Authorization Type medicare    PT Start Time 1350    PT Stop Time 1430    PT Time Calculation (min) 40 min    Activity Tolerance Patient tolerated treatment well    Behavior During Therapy WFL for tasks assessed/performed             Past Medical History:  Diagnosis Date   Anemia    Ankle fracture    Arthritis    Asthma    Blood transfusion without reported diagnosis    Breast cancer (HCC)    Cancer (HCC)    COVID    Diabetes mellitus without complication (HCC)    Dyspnea    End stage renal disease on dialysis (HCC)    M/W/F [facility] in [place]   GERD (gastroesophageal reflux disease)    Hypertension    OSA (obstructive sleep apnea)    uses CPAP sometimes   Pneumonia    PONV (postoperative nausea and vomiting)    Wears glasses    Past Surgical History:  Procedure Laterality Date   A/V FISTULAGRAM Left 08/13/2023   Procedure: A/V Fistulagram;  Surgeon: Serene Gaile ORN, MD;  Location: [facility];  Service: Cardiovascular;  Laterality: Left;   ABDOMINAL HYSTERECTOMY     APPLICATION OF WOUND VAC Left 05/26/2023   Procedure: APPLICATION OF WOUND VAC;  Surgeon: Lanis Fonda BRAVO, MD;  Location: [facility] OR;  Service: Vascular;  Laterality: Left;   AV FISTULA PLACEMENT  11/2014   at [facility] hospital   AV FISTULA REPAIR N/A 04/2021   BALLOON DILATION N/A 07/10/2016   Procedure: BALLOON DILATION;  Surgeon: Margo LITTIE Haddock, MD;  Location: [facility];  Service: Endoscopy;  Laterality: N/A;  Pyloric dilation   BREAST LUMPECTOMY     CATARACT EXTRACTION W/PHACO Right 05/24/2023   Procedure: CATARACT EXTRACTION PHACO AND INTRAOCULAR  LENS PLACEMENT (IOC);  Surgeon: Harrie Agent, MD;  Location: [facility];  Service: Ophthalmology;  Laterality: Right;  CDE: 5.00   CESAREAN SECTION     CHOLECYSTECTOMY     COLONOSCOPY  02/2023   COLONOSCOPY WITH PROPOFOL  N/A 09/27/2016   Dr. Shaaron: Internal hemorrhoids repeat colonoscopy in 10 years   COLONOSCOPY WITH PROPOFOL  N/A 02/28/2023   Procedure: COLONOSCOPY WITH PROPOFOL ;  Surgeon: Shaaron Lamar HERO, MD;  Location: [facility];  Service: Endoscopy;  Laterality: N/A;  11:45 am, asa 3 dialysis pt   DIALYSIS/PERMA CATHETER INSERTION N/A 11/15/2023   Procedure: DIALYSIS/PERMA CATHETER INSERTION;  Surgeon: Serene Gaile ORN, MD;  Location: [facility] INV[facility] CV LAB;  Service: Cardiovascular;  Laterality: N/A;   DILATION AND CURETTAGE OF UTERUS     ESOPHAGOGASTRODUODENOSCOPY N/A 07/10/2016   Dr.Fields- normal esophagus, gastric stenosis was found at the pylorus, gastritis on bx, normal examined duodenun   ESOPHAGOGASTRODUODENOSCOPY (EGD) WITH PROPOFOL  N/A 11/21/2022   Procedure: ESOPHAGOGASTRODUODENOSCOPY (EGD) WITH PROPOFOL ;  Surgeon: Eartha Angelia Sieving, MD;  Location: [facility];  Service: Gastroenterology;  Laterality: N/A;   EXTERNAL FIXATION REMOVAL Right 10/29/2018   Procedure: REMOVAL RIGHT ANKLE BIOMET ZIMMER EXTERNAL FIXATOR, SHORT LEG CAST APPLICATION;  Surgeon: Barbarann Oneil BROCKS, MD;  Location: [facility] OR;  Service: Orthopedics;  Laterality: Right;   INSERTION OF DIALYSIS CATHETER  05/26/2023   Procedure: INSERTION OF Left internal jugular DIALYSIS CATHETER;  Surgeon: Lanis Fonda BRAVO, MD;  Location: [facility] OR;  Service: Vascular;;   IR FLUORO GUIDE CV LINE LEFT  08/28/2023   IR FLUORO GUIDE CV LINE RIGHT  09/24/2023   IR PATIENT EVAL TECH 0-60 MINS  09/24/2023   IR US  GUIDE VASC ACCESS LEFT  08/28/2023   IR US  GUIDE VASC ACCESS RIGHT  09/24/2023   MASTECTOMY     left sided   ORIF ANKLE FRACTURE Right 10/06/2018   Procedure: OPEN REDUCTION INTERNAL FIXATION (ORIF) RIGHT ANKLE TRIMALLEOLAR;   Surgeon: Barbarann Oneil BROCKS, MD;  Location: [facility] OR;  Service: Orthopedics;  Laterality: Right;   PERIPHERAL VASCULAR BALLOON ANGIOPLASTY  08/13/2023   Procedure: PERIPHERAL VASCULAR BALLOON ANGIOPLASTY;  Surgeon: Serene Gaile ORN, MD;  Location: [facility];  Service: Cardiovascular;;   REVISON OF ARTERIOVENOUS FISTULA Left 05/26/2023   Procedure: REVISON OF ARTERIOVENOUS FISTULA LEFT ARM HEMATOMA WASH OUT;  Surgeon: Lanis Fonda BRAVO, MD;  Location: [facility] OR;  Service: Vascular;  Laterality: Left;   RIGHT/LEFT HEART CATH AND CORONARY ANGIOGRAPHY N/A 11/27/2022   Procedure: RIGHT/LEFT HEART CATH AND CORONARY ANGIOGRAPHY;  Surgeon: Jordan, Peter M, MD;  Location: [facility] INVASIVE CV LAB;  Service: Cardiovascular;  Laterality: N/A;   TEE WITHOUT CARDIOVERSION N/A 09/18/2023   Procedure: TRANSESOPHAGEAL ECHOCARDIOGRAM (TEE);  Surgeon: Debera Jayson MATSU, MD;  Location: [facility];  Service: Cardiovascular;  Laterality: N/A;   Patient Active Problem List   Diagnosis Date Noted   Staphylococcus aureus bacteremia 09/18/2023   Bacteremia 09/16/2023   Respiratory failure (HCC) 09/14/2023   Severe sepsis (HCC) 09/14/2023   Type 2 diabetes mellitus with hyperlipidemia (HCC) 09/14/2023   Acute anemia 06/03/2023   Complication of AV dialysis fistula 05/26/2023   Anemia of chronic disease 05/26/2023   Mixed hyperlipidemia 03/23/2023   Normocytic anemia 02/12/2023   Abdominal pain 02/12/2023   Chronic respiratory failure with hypoxia (HCC) 01/15/2023   Other specified disorders of bone density and structure, other site 01/02/2023   Acute on chronic hypoxic respiratory failure (HCC) 12/10/2022   Precordial chest pain 11/26/2022   ESRD needing dialysis (HCC) 11/20/2022   Class 3 obesity 11/19/2022   BOOP (bronchiolitis obliterans with organizing pneumonia) (HCC) 11/19/2022   Bronchiolitis obliterans (HCC) 11/19/2022   Post-traumatic osteoarthritis, right ankle and foot 08/31/2022   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    Elevated alkaline phosphatase level 05/24/2022   Right foot sprain 12/20/2020   Contusion of left shoulder 12/20/2020   Syncope 08/03/2020   Acute pulmonary edema (HCC) 01/11/2020   OSA (obstructive sleep apnea)    Closed right ankle fracture 10/06/2018   Closed displaced trimalleolar fracture of right ankle 10/06/2018   Acute colitis 02/26/2018   Hypoxemia 02/24/2018   Dyspnea 11/26/2017   Acute on chronic anemia 11/26/2017   Elevated troponin 11/26/2017   Hyperkalemia 09/29/2017   Acute on chronic respiratory failure with hypoxia (HCC) 09/28/2017   CAP (community acquired pneumonia) 09/26/2017   Volume overload 09/25/2017   Acute on chronic diastolic CHF (congestive heart failure) (HCC) 09/25/2017   Lactic acidosis 09/25/2017   Hypokalemia 09/25/2017   Breast cancer (HCC) 07/22/2017   SOB (shortness of breath)    Acute respiratory failure with hypoxia (HCC) 07/21/2017   Flatulence 10/25/2016   GERD (gastroesophageal reflux disease) 08/02/2016   Pyloric stenosis, acquired    Nausea with vomiting  Generalized abdominal pain    Chest pain, rule out acute myocardial infarction 07/04/2016   Constipation 07/04/2016   Nausea 07/04/2016   Essential hypertension 07/04/2016   HCAP (healthcare-associated pneumonia) 12/04/2015   ESRD on dialysis (HCC) 12/04/2015   Diabetes mellitus with end-stage renal disease (HCC) 12/04/2015    ONSET DATE: 10/10/24  REFERRING DIAG:  Diagnosis  S81.801D (ICD-10-CM) - Unspecified open wound, right lower leg, subsequent encounter    THERAPY DIAG:  Diagnosis  S81.801D (ICD-10-CM) - Unspecified open wound, right lower leg, subsequent encounter    Rationale for Evaluation and Treatment: Rehabilitation     Wound Therapy - 01/23/24 1432     Subjective 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  Reports she is in pain, none with wounds.  Had xray done on back last week, no results yet.    Patient  and Family Stated Goals wounds to heal    Date of Onset 10/10/24    Prior Treatments various ointments and antibiotics    Pain Scale 0-10    Pain Score 0-No pain    Evaluation and Treatment Procedures Explained to Patient/Family Yes    Evaluation and Treatment Procedures agreed to    Wound Properties Date First Assessed: 01/02/24 Time First Assessed: 1350 Wound Type: Other (Comment) Location: Leg Location Orientation: Right;Medial;Posterior Wound Description (Comments): multiple small wounds Present on Admission: Yes   Dressing Type Impregnated gauze (bismuth);Abdominal pads;Gauze (Comment);Compression wrap   lotion/vaseline, xeroform, ABD pad and cotton for shape, kerlix and coban with netting   Dressing Changed Changed    Dressing Status Old drainage    Dressing Change Frequency PRN    Site / Wound Assessment Dry;Yellow;Granulation tissue    % Wound base Red or Granulating 95%    % Wound base Yellow/Fibrinous Exudate 5%    Peri-wound Assessment Intact   dry skin   Drainage Amount None    Treatment Cleansed;Debridement (Selective)    Selective Debridement (non-excisional) - Location wound bed    Selective Debridement (non-excisional) - Tools Used Forceps;Scalpel    Selective Debridement (non-excisional) - Tissue Removed Devitalized tissue, dry skin    Wound Therapy - Clinical Statement see below    Factors Delaying/Impairing Wound Healing Diabetes Mellitus;Multiple medical problems;Polypharmacy;Vascular compromise;Other (comment)   end stage kidney disease   Hydrotherapy Plan Debridement;Dressing change;Patient/family education    Wound Therapy - Frequency 2X / week    Wound Therapy - Current Recommendations PT    Wound Plan debridement and dressing change    Dressing  lotion/vaseline, xerofrom, 2x2, ABD and cotton for shape, kerlix and coban               PATIENT EDUCATION: Education details: keep dressing dry , remove if it becomes painful Person educated: Patient Education  method: Explanation Education comprehension: verbalized understanding   HOME EXERCISE PROGRAM: N/A   GOALS: Goals reviewed with patient? No  SHORT TERM GOALS: Target date: 01/18/24  PT wounds to be 100% granulated Baseline: Goal status: INITIAL  2.  Pt pain to be no greater than a 3/10 Baseline:  Goal status: INITIAL   Brammer TERM GOALS: Target date: 02/01/24  Pt wound to be healed  Baseline:  Goal status: INITIAL  2.  PT to have no pain in her Right LE  Baseline:  Goal status: INITIAL   ASSESSMENT:  CLINICAL IMPRESSION: 01/23/24:  All wounds covered with dry scab, no drainage on dressings.  Therapist removed all dry tissue to reveal  all wounds were healed beneath with exception of the largest one anteriorly.  Evaluating therapist inspected and decided to dress this wounds and continue X1 more week with hopes it will be fully healed next session.  Continued with heavy lotion, vaseline and xeroform to anterior wound.  Kerlix and coban were used for compression.  PT reported overall comfort. Will skip next app and come back in one week.   Eval:Patient is a 53 y.o. female who was seen today for physical therapy evaluation and treatment for multiple non healing wounds on pt Rt LE.  Pt states that her port became infected in October, she was admitted to the hospital for IV antibiotics.  She did not have anything on her legs but a day after she got home she went to the MD as she had red spots all over her legs which turned into wounds.   She has tried medication and has been on antibiotics.  This helped the Lt leg but the Rt leg continues to have multiple wounds on it.   Ms.  [name] will benefit from skilled PT to create a more healing environment and debride her wounds to allow healing to occur.    OBJECTIVE IMPAIRMENTS: increased edema, pain, and decreased skin integrity .   ACTIVITY LIMITATIONS: bathing and dressing   PERSONAL FACTORS: Fitness, Time since onset of  injury/illness/exacerbation, and 1-2 comorbidities: DM, end stage renal failure  are also affecting patient's functional outcome.   REHAB POTENTIAL: Good  CLINICAL DECISION MAKING: Stable/uncomplicated  EVALUATION COMPLEXITY: Low  PLAN: PT FREQUENCY: 2x/week  PT DURATION: 4 weeks  PLANNED INTERVENTIONS: 97535- Self Care, [id number]- Manual therapy, 97597- Wound care (first 20 sq cm), and Patient/Family education  PLAN FOR NEXT SESSION: continue to debride away eschar and necrotic tissue to increase approximation.,  may be ready for discharge next visit.   Patricia [name], PTA 01/23/2024, 4:27 PM  01/23/2024, 4:27 PM

## 2024-01-23 NOTE — Patient Instructions (Signed)
 Continue pantoprazole  once a day, and you can add pepcid  in afternoon or evening if breakthrough symptoms.  If the Linzess  stops working, you can try taking it with food, as it can be stronger this way sometimes.   We will see you in 6 months! I have included a reflux sheet to help with food and drink choices.  I enjoyed seeing you again today! I value our relationship and want to provide genuine, compassionate, and quality care. You may receive a survey regarding your visit with me, and I welcome your feedback! Thanks so much for taking the time to complete this. I look forward to seeing you again.      Therisa MICAEL Stager, PhD, ANP-BC [facility]

## 2024-01-23 NOTE — Progress Notes (Signed)
 Gastroenterology Office Note     Primary Care Physician:  Leonce Lucie [name]  Primary Gastroenterologist: Dr. Shaaron    Chief Complaint   Chief Complaint  Patient presents with   Follow-up     History of Present Illness   Patricia [name] is a 53 y.o. female presenting today with a history of  significant for ESRD on MWF HD, DM 2, HTN, OSA, chronic hypoxic respiratory failure on 2 L at home, obliterative bronchiolitis, chronic constipation and GERD, recently admitted with worsening anemia and concern for melena in Dec 2023, last seen in Feb 2024. Colonoscopy in interim from last visit March 2024 with normal colon. EGD prior to this unrevealing. Anemia multifactorial in setting of ESRD.   On 2 liters nasal cannula. Trying to wean off. When moving around, oxygen  drops. Less use at home. Port was infected last year. Right leg wound care ongoing with PT.   Gets a dry cough after eating peanuts. Avoiding peanuts. Pantoprazole  once daily. Having some flares with GERD lately. Hasn't changed diet. Cheerwine zero. Tries to not eat after 7pm and doesn't go to bed for many hours later. Weight up about 10 lbs from Nov 2024.   Filling out Linzess  290 mcg patient assistance forms. Will work well for 3 months then stop then has to switch it up and add stool softener. Miralax  not helpful. Fiber not helpful.  Dulcolax tablet doesn't help.     EGD 12/23 unrevealing Colonoscopy 3/24: normal colon         Past Medical History:  Diagnosis Date   Anemia    Ankle fracture    Arthritis    Asthma    Blood transfusion without reported diagnosis    Breast cancer (HCC)    Cancer (HCC)    COVID    Diabetes mellitus without complication (HCC)    Dyspnea    End stage renal disease on dialysis (HCC)    M/W/F [facility] in [place]   GERD (gastroesophageal reflux disease)    Hypertension    OSA (obstructive sleep apnea)    uses CPAP sometimes   Pneumonia    PONV (postoperative nausea and  vomiting)    Wears glasses     Past Surgical History:  Procedure Laterality Date   A/V FISTULAGRAM Left 08/13/2023   Procedure: A/V Fistulagram;  Surgeon: Serene Gaile ORN, MD;  Location: [facility];  Service: Cardiovascular;  Laterality: Left;   ABDOMINAL HYSTERECTOMY     APPLICATION OF WOUND VAC Left 05/26/2023   Procedure: APPLICATION OF WOUND VAC;  Surgeon: Lanis Fonda BRAVO, MD;  Location: [facility] OR;  Service: Vascular;  Laterality: Left;   AV FISTULA PLACEMENT  11/2014   at [facility] hospital   AV FISTULA REPAIR N/A 04/2021   BALLOON DILATION N/A 07/10/2016   Procedure: BALLOON DILATION;  Surgeon: Margo LITTIE Haddock, MD;  Location: [facility];  Service: Endoscopy;  Laterality: N/A;  Pyloric dilation   BREAST LUMPECTOMY     CATARACT EXTRACTION W/PHACO Right 05/24/2023   Procedure: CATARACT EXTRACTION PHACO AND INTRAOCULAR LENS PLACEMENT (IOC);  Surgeon: Harrie Agent, MD;  Location: [facility] ORS;  Service: Ophthalmology;  Laterality: Right;  CDE: 5.00   CESAREAN SECTION     CHOLECYSTECTOMY     COLONOSCOPY  02/2023   COLONOSCOPY WITH PROPOFOL  N/A 09/27/2016   Dr. Shaaron: Internal hemorrhoids repeat colonoscopy in 10 years   COLONOSCOPY WITH PROPOFOL  N/A 02/28/2023   Procedure: COLONOSCOPY WITH PROPOFOL ;  Surgeon: Shaaron Lamar HERO, MD;  Location: [facility];  Service: Endoscopy;  Laterality: N/A;  11:45 am, asa 3 dialysis pt   DIALYSIS/PERMA CATHETER INSERTION N/A 11/15/2023   Procedure: DIALYSIS/PERMA CATHETER INSERTION;  Surgeon: Serene Gaile ORN, MD;  Location: [facility] INVASIVE CV LAB;  Service: Cardiovascular;  Laterality: N/A;   DILATION AND CURETTAGE OF UTERUS     ESOPHAGOGASTRODUODENOSCOPY N/A 07/10/2016   Dr.Fields- normal esophagus, gastric stenosis was found at the pylorus, gastritis on bx, normal examined duodenun   ESOPHAGOGASTRODUODENOSCOPY (EGD) WITH PROPOFOL  N/A 11/21/2022   Procedure: ESOPHAGOGASTRODUODENOSCOPY (EGD) WITH PROPOFOL ;  Surgeon: Eartha Angelia Sieving, MD;   [facility]: [facility];  Service: Gastroenterology;  Laterality: N/A;   EXTERNAL FIXATION REMOVAL Right 10/29/2018   Procedure: REMOVAL RIGHT ANKLE BIOMET ZIMMER EXTERNAL FIXATOR, SHORT LEG CAST APPLICATION;  Surgeon: Barbarann Oneil BROCKS, MD;  Location: [facility] OR;  Service: Orthopedics;  Laterality: Right;   INSERTION OF DIALYSIS CATHETER  05/26/2023   Procedure: INSERTION OF Left internal jugular DIALYSIS CATHETER;  Surgeon: Lanis Fonda BRAVO, MD;  Location: [facility] OR;  Service: Vascular;;   IR FLUORO GUIDE CV LINE LEFT  08/28/2023   IR FLUORO GUIDE CV LINE RIGHT  09/24/2023   IR PATIENT EVAL TECH 0-60 MINS  09/24/2023   IR US  GUIDE VASC ACCESS LEFT  08/28/2023   IR US  GUIDE VASC ACCESS RIGHT  09/24/2023   MASTECTOMY     left sided   ORIF ANKLE FRACTURE Right 10/06/2018   Procedure: OPEN REDUCTION INTERNAL FIXATION (ORIF) RIGHT ANKLE TRIMALLEOLAR;  Surgeon: Barbarann Oneil BROCKS, MD;  Location: [facility] OR;  Service: Orthopedics;  Laterality: Right;   PERIPHERAL VASCULAR BALLOON ANGIOPLASTY  08/13/2023   Procedure: PERIPHERAL VASCULAR BALLOON ANGIOPLASTY;  Surgeon: Serene Gaile ORN, MD;  Location: [facility];  Service: Cardiovascular;;   REVISON OF ARTERIOVENOUS FISTULA Left 05/26/2023   Procedure: REVISON OF ARTERIOVENOUS FISTULA LEFT ARM HEMATOMA WASH OUT;  Surgeon: Lanis Fonda BRAVO, MD;  Location: [facility] OR;  Service: Vascular;  Laterality: Left;   RIGHT/LEFT HEART CATH AND CORONARY ANGIOGRAPHY N/A 11/27/2022   Procedure: RIGHT/LEFT HEART CATH AND CORONARY ANGIOGRAPHY;  Surgeon: Jordan, Peter M, MD;  Location: [facility] INVASIVE CV LAB;  Service: Cardiovascular;  Laterality: N/A;   TEE WITHOUT CARDIOVERSION N/A 09/18/2023   Procedure: TRANSESOPHAGEAL ECHOCARDIOGRAM (TEE);  Surgeon: Debera Jayson MATSU, MD;  Location: [facility];  Service: Cardiovascular;  Laterality: N/A;    Current Outpatient Medications  Medication Sig Dispense Refill   acetaminophen  (TYLENOL ) 325 MG tablet Take 650 mg by mouth every 6 (six) hours as needed for  mild pain or fever.     albuterol  (VENTOLIN  HFA) 108 (90 Base) MCG/ACT inhaler Inhale 2 puffs into the lungs every 6 (six) hours as needed for wheezing or shortness of breath. 1 each 3   ALPRAZolam  (XANAX ) 0.5 MG tablet Take 0.5 mg by mouth at bedtime as needed for anxiety or sleep.     aspirin  EC 81 MG tablet Take 1 tablet (81 mg total) by mouth daily. Swallow whole. 90 tablet 3   ceFAZolin  (ANCEF ) 2-4 GM/100ML-% IVPB Inject 100 mLs (2 g total) into the vein every Monday, Wednesday, and Friday with hemodialysis. Last dose on 10/30/23 on dialysis     cetirizine  (ZYRTEC ) 10 MG tablet Take 1 tablet (10 mg total) by mouth daily. 30 tablet 3   Cholecalciferol  (VITAMIN D -3 PO) Take 1 tablet by mouth daily.     cinacalcet  (SENSIPAR ) 30 MG tablet Take 30 mg by mouth daily.     docusate sodium  (  COLACE) 100 MG capsule Take 100 mg by mouth daily.     ferric citrate  (AURYXIA ) 1 GM 210 MG(Fe) tablet Take 420 mg by mouth 2 (two) times daily with a meal. With Breakfast & with supper     glipiZIDE  (GLUCOTROL ) 5 MG tablet Take 1 tablet (5 mg total) by mouth daily before breakfast. 180 tablet 0   ibuprofen (ADVIL) 800 MG tablet Take 800 mg by mouth every 8 (eight) hours as needed.     insulin  degludec (TRESIBA  FLEXTOUCH) 200 UNIT/ML FlexTouch Pen Inject 30 Units into the skin at bedtime. 6 mL 0   ipratropium (ATROVENT ) 0.06 % nasal spray Place 2 sprays into both nostrils daily as needed for rhinitis.     irbesartan  (AVAPRO ) 75 MG tablet Take 75 mg by mouth See admin instructions. Take 75 mg at night on non-dialysis days (Sunday, Tuesday, Thursday, Saturday)     labetalol  (NORMODYNE ) 100 MG tablet Take 100 mg by mouth 2 (two) times daily.     linaclotide  (LINZESS ) 290 MCG CAPS capsule Take 290 mcg by mouth daily before breakfast.     montelukast  (SINGULAIR ) 10 MG tablet Take 1 tablet (10 mg total) by mouth at bedtime. 30 tablet 0   multivitamin (RENA-VIT) TABS tablet Take 1 tablet by mouth daily.     NIFEdipine   (ADALAT  CC) 30 MG 24 hr tablet Take 30 mg by mouth See admin instructions. Take 30 mg at night on non-dialysis days (Sunday, Tuesday, Thursday, Saturday)     ondansetron  (ZOFRAN -ODT) 4 MG disintegrating tablet Take 1 tablet (4 mg total) by mouth every 8 (eight) hours as needed for nausea or vomiting. 60 tablet 3   oxyCODONE  (OXY IR/ROXICODONE ) 5 MG immediate release tablet Take 1 tablet (5 mg total) by mouth every 6 (six) hours as needed for moderate pain. 10 tablet 0   OXYGEN  Inhale 2 L into the lungs continuous.     pantoprazole  (PROTONIX ) 40 MG tablet TAKE 1 TABLET BY MOUTH EVERY DAY 30 MINS BEFORE BREAKFAST 30 tablet 0   Polyvinyl Alcohol-Povidone (REFRESH OP) Place 1 drop into both eyes daily as needed (dry eyes).     rOPINIRole  (REQUIP  XL) 2 MG 24 hr tablet Take 2 mg by mouth at bedtime.      rosuvastatin  (CRESTOR ) 20 MG tablet Take 20 mg by mouth daily.     sevelamer  carbonate (RENVELA ) 800 MG tablet Take 1,600-2,400 mg by mouth See admin instructions. Take 1600 mg at breakfast, 2400 mg at lunch and 1600 mg at dinner     No current facility-administered medications for this visit.    Allergies as of 01/23/2024 - Review Complete 01/23/2024  Allergen Reaction Noted   Norvasc  [amlodipine  besylate] Rash and Other (See Comments) 12/04/2015   Reglan  [metoclopramide ] Other (See Comments) 12/04/2015    Family History  Problem Relation Age of Onset   Diabetes Mellitus II Mother    Hypertension Mother    Heart block Mother    Hypertension Sister    Hypertension Sister    Colon cancer Neg Hx     Social History   Socioeconomic History   Marital status: Widowed    Spouse name: Not on file   Number of children: Not on file   Years of education: Not on file   Highest education level: Not on file  Occupational History   Not on file  Tobacco Use   Smoking status: Never   Smokeless tobacco: Never  Vaping Use   Vaping status: Never Used  Substance and Sexual Activity   Alcohol use:  No    Alcohol/week: 0.0 standard drinks of alcohol   Drug use: No   Sexual activity: Not Currently  Other Topics Concern   Not on file  Social History Narrative   Lives with son, Jamar.  No pets.   Social Drivers of Corporate Investment Banker Strain: Low Risk  (11/12/2023)   Received from [facility]   Overall Financial Resource Strain (CARDIA)    Difficulty of Paying Living Expenses: Not very hard  Food Insecurity: No Food Insecurity (11/12/2023)   Received from [facility]   Hunger Vital Sign    Worried About Running Out of Food in the Last Year: Never true    Ran Out of Food in the Last Year: Never true  Transportation Needs: Unknown (11/12/2023)   Received from [facility] - Transportation    In the past 12 months, has lack of transportation kept you from medical appointments or from getting medications?: No    Lack of Transportation (Non-Medical): Patient declined  Physical Activity: Patient Declined (11/08/2023)   Received from [facility]   Exercise Vital Sign    Days of Exercise per Week: Patient declined    Minutes of Exercise per Session: Patient declined  Stress: Patient Declined (11/08/2023)   Received from [facility] - Occupational Stress Questionnaire    Feeling of Stress : Patient declined  Social Connections: Patient Declined (11/08/2023)   Received from [facility]   Social Connection and Isolation Panel [NHANES]    Frequency of Communication with Friends and Family: Patient declined    Frequency of Social Gatherings with Friends and Family: Patient declined    Attends Religious Services: Patient declined    Database Administrator or Organizations: Patient declined    Attends Banker Meetings: Patient declined    Marital Status: Patient declined  Intimate Partner Violence: Not At Risk  (09/14/2023)   Humiliation, Afraid, Rape, and Kick questionnaire    Fear of Current or Ex-Partner: No    Emotionally Abused: No    Physically Abused: No    Sexually Abused: No     Review of Systems   Gen: Denies any fever, chills, fatigue, weight loss, lack of appetite.  CV: Denies chest pain, heart palpitations, peripheral edema, syncope.  Resp: Denies shortness of breath at rest or with exertion. Denies wheezing or cough.  GI: Denies dysphagia or odynophagia. Denies jaundice, hematemesis, fecal incontinence. GU : Denies urinary burning, urinary frequency, urinary hesitancy MS: Denies joint pain, muscle weakness, cramps, or limitation of movement.  Derm: Denies rash, itching, dry skin Psych: Denies depression, anxiety, memory loss, and confusion Heme: Denies bruising, bleeding, and enlarged lymph nodes.   Physical Exam   BP (!) 156/65 (BP Location: Right Arm, Patient Position: Sitting, Cuff Size: Normal) Comment (Cuff Size): taken on forearm  Pulse 85   Temp 98.7 F (37.1 C) (Oral)   Ht 5' 3 (1.6 m)   Wt 255 lb 6.4 oz (115.8 kg)   BMI 45.24 kg/m  General:   Alert and oriented. Pleasant and cooperative. Well-nourished and well-developed.  Head:  Normocephalic and atraumatic. Eyes:  Without icterus Abdomen:  +BS, soft, non-tender and non-distended. No HSM noted. No guarding or rebound. No masses appreciated.  Rectal:  Deferred  Msk:  Symmetrical without gross deformities. Normal posture. Extremities:  Without edema. Neurologic:  Alert and  oriented x4;  grossly normal neurologically. Skin:  Intact without significant lesions or rashes. Psych:  Alert and cooperative. Normal mood and affect.   Assessment   Teriah Ojala is a 53 y.o. female presenting today with a history significant for ESRD on MWF HD, DM 2, HTN, OSA, chronic hypoxic respiratory failure on 2 L at home, obliterative bronchiolitis, chronic constipation and GERD, chronic anemia, returning for follow-up and  refills.    GERD: some exacerbations recently. Will continue PPI at just once daily dosing and can add Pepcid  prn. Suspect multifactorial in setting of diet and weight gain. GERD diet provided.   Constipation: continue Linzess  290 mcg daily. Sometimes with decreased efficacy every few months, so we can do novel dosing of linzess  with food.   PLAN    Pantoprazole  daily, add Pepcid  prn GERD diet provided Linzess  290 mcg daily 6 month follow-up   Therisa MICAEL Stager, PhD, ANP-BC [facility] Gastroenterology

## 2024-01-24 DIAGNOSIS — D509 Iron deficiency anemia, unspecified: Secondary | ICD-10-CM | POA: Diagnosis not present

## 2024-01-24 DIAGNOSIS — Z992 Dependence on renal dialysis: Secondary | ICD-10-CM | POA: Diagnosis not present

## 2024-01-24 DIAGNOSIS — N2581 Secondary hyperparathyroidism of renal origin: Secondary | ICD-10-CM | POA: Diagnosis not present

## 2024-01-24 DIAGNOSIS — N250 Renal osteodystrophy: Secondary | ICD-10-CM | POA: Diagnosis not present

## 2024-01-24 DIAGNOSIS — D631 Anemia in chronic kidney disease: Secondary | ICD-10-CM | POA: Diagnosis not present

## 2024-01-24 DIAGNOSIS — N186 End stage renal disease: Secondary | ICD-10-CM | POA: Diagnosis not present

## 2024-01-26 DIAGNOSIS — N2581 Secondary hyperparathyroidism of renal origin: Secondary | ICD-10-CM | POA: Diagnosis not present

## 2024-01-26 DIAGNOSIS — Z992 Dependence on renal dialysis: Secondary | ICD-10-CM | POA: Diagnosis not present

## 2024-01-26 DIAGNOSIS — D509 Iron deficiency anemia, unspecified: Secondary | ICD-10-CM | POA: Diagnosis not present

## 2024-01-26 DIAGNOSIS — N250 Renal osteodystrophy: Secondary | ICD-10-CM | POA: Diagnosis not present

## 2024-01-26 DIAGNOSIS — D631 Anemia in chronic kidney disease: Secondary | ICD-10-CM | POA: Diagnosis not present

## 2024-01-26 DIAGNOSIS — N186 End stage renal disease: Secondary | ICD-10-CM | POA: Diagnosis not present

## 2024-01-27 DIAGNOSIS — D631 Anemia in chronic kidney disease: Secondary | ICD-10-CM | POA: Diagnosis not present

## 2024-01-27 DIAGNOSIS — Z992 Dependence on renal dialysis: Secondary | ICD-10-CM | POA: Diagnosis not present

## 2024-01-27 DIAGNOSIS — D509 Iron deficiency anemia, unspecified: Secondary | ICD-10-CM | POA: Diagnosis not present

## 2024-01-27 DIAGNOSIS — N2581 Secondary hyperparathyroidism of renal origin: Secondary | ICD-10-CM | POA: Diagnosis not present

## 2024-01-27 DIAGNOSIS — N186 End stage renal disease: Secondary | ICD-10-CM | POA: Diagnosis not present

## 2024-01-27 DIAGNOSIS — N250 Renal osteodystrophy: Secondary | ICD-10-CM | POA: Diagnosis not present

## 2024-01-28 ENCOUNTER — Ambulatory Visit (HOSPITAL_COMMUNITY): Payer: Medicare Other

## 2024-01-29 DIAGNOSIS — N2581 Secondary hyperparathyroidism of renal origin: Secondary | ICD-10-CM | POA: Diagnosis not present

## 2024-01-29 DIAGNOSIS — D509 Iron deficiency anemia, unspecified: Secondary | ICD-10-CM | POA: Diagnosis not present

## 2024-01-29 DIAGNOSIS — Z992 Dependence on renal dialysis: Secondary | ICD-10-CM | POA: Diagnosis not present

## 2024-01-29 DIAGNOSIS — D631 Anemia in chronic kidney disease: Secondary | ICD-10-CM | POA: Diagnosis not present

## 2024-01-29 DIAGNOSIS — N250 Renal osteodystrophy: Secondary | ICD-10-CM | POA: Diagnosis not present

## 2024-01-29 DIAGNOSIS — N186 End stage renal disease: Secondary | ICD-10-CM | POA: Diagnosis not present

## 2024-01-30 ENCOUNTER — Ambulatory Visit (HOSPITAL_COMMUNITY): Payer: Medicare Other

## 2024-01-30 ENCOUNTER — Encounter (HOSPITAL_COMMUNITY): Payer: Self-pay

## 2024-01-30 ENCOUNTER — Ambulatory Visit (INDEPENDENT_AMBULATORY_CARE_PROVIDER_SITE_OTHER): Payer: Medicare Other | Admitting: "Endocrinology

## 2024-01-30 ENCOUNTER — Encounter: Payer: Self-pay | Admitting: "Endocrinology

## 2024-01-30 VITALS — BP 148/86 | HR 72 | Ht 63.0 in | Wt 252.6 lb

## 2024-01-30 DIAGNOSIS — Z7984 Long term (current) use of oral hypoglycemic drugs: Secondary | ICD-10-CM

## 2024-01-30 DIAGNOSIS — E049 Nontoxic goiter, unspecified: Secondary | ICD-10-CM

## 2024-01-30 DIAGNOSIS — Z794 Long term (current) use of insulin: Secondary | ICD-10-CM

## 2024-01-30 DIAGNOSIS — E1122 Type 2 diabetes mellitus with diabetic chronic kidney disease: Secondary | ICD-10-CM | POA: Diagnosis not present

## 2024-01-30 DIAGNOSIS — M79661 Pain in right lower leg: Secondary | ICD-10-CM

## 2024-01-30 DIAGNOSIS — E782 Mixed hyperlipidemia: Secondary | ICD-10-CM

## 2024-01-30 DIAGNOSIS — H26491 Other secondary cataract, right eye: Secondary | ICD-10-CM | POA: Diagnosis not present

## 2024-01-30 DIAGNOSIS — S81801A Unspecified open wound, right lower leg, initial encounter: Secondary | ICD-10-CM

## 2024-01-30 DIAGNOSIS — N186 End stage renal disease: Secondary | ICD-10-CM

## 2024-01-30 HISTORY — PX: REFRACTIVE SURGERY: SHX103

## 2024-01-30 MED ORDER — TRESIBA FLEXTOUCH 200 UNIT/ML ~~LOC~~ SOPN: 30.0000 [IU] | PEN_INJECTOR | Freq: Every day | SUBCUTANEOUS | 2 refills | Status: DC

## 2024-01-30 NOTE — Progress Notes (Signed)
01/30/2024, 5:07 PM   Endocrinology follow-up note  Subjective:    Patient ID: Patricia [name], female    DOB: Jun 07, 1971, PCP [name], PA-C   Past Medical History:  Diagnosis Date   Anemia    Ankle fracture    Arthritis    Asthma    Blood transfusion without reported diagnosis    Breast cancer (HCC)    Cancer (HCC)    COVID    Diabetes mellitus without complication (HCC)    Dyspnea    End stage renal disease on dialysis (HCC)    M/W/F [facility] in [place]   GERD (gastroesophageal reflux disease)    Hypertension    OSA (obstructive sleep apnea)    uses CPAP sometimes   Pneumonia    PONV (postoperative nausea and vomiting)    Wears glasses    Past Surgical History:  Procedure Laterality Date   A/V FISTULAGRAM Left 08/13/2023   Procedure: A/V Fistulagram;  Surgeon: Nada Libman, MD;  Location: [facility] INVAS[facility] CV LAB;  Service: Cardiovascular;  Laterality: Left;   ABDOMINAL HYSTERECTOMY     APPLICATION OF WOUND VAC Left 05/26/2023   Procedure: APPLICATION OF WOUND VAC;  Surgeon: Victorino Sparrow, MD;  Location: [facility] OR;  Service: Vascular;  Laterality: Left;   AV FISTULA PLACEMENT  11/2014   at [facility] hospital   AV FISTULA REPAIR N/A 04/2021   BALLOON DILATION N/A 07/10/2016   Procedure: BALLOON DILATION;  Surgeon: West Bali, MD;  Location: [facility] ENDO SUITE;  Service: Endoscopy;  Laterality: N/A;  Pyloric dilation   BREAST LUMPECTOMY     CATARACT EXTRACTION W/PHACO Right 05/24/2023   Procedure: CATARACT EXTRACTION PHACO AND INTRAOCULAR LENS PLACEMENT (IOC);  Surgeon: [name], MD;  Location: [facility] ORS;  Service: Ophthalmology;  Laterality: Right;  CDE: 5.00   CESAREAN SECTION     CHOLECYSTECTOMY     COLONOSCOPY  02/2023   COLONOSCOPY WITH PROPOFOL N/A 09/27/2016   Dr. Jena Gauss: Internal hemorrhoids repeat colonoscopy in 10 years   COLONOSCOPY WITH PROPOFOL N/A 02/28/2023   Procedure: COLONOSCOPY WITH  PROPOFOL;  Surgeon: Corbin Ade, MD;  Location: [facility];  Service: Endoscopy;  Laterality: N/A;  11:45 am, asa 3 dialysis pt   DIALYSIS/PERMA CATHETER INSERTION N/A 11/15/2023   Procedure: DIALYSIS/PERMA CATHETER INSERTION;  Surgeon: Nada Libman, MD;  Location: [facility] INVASIVE CV LAB;  Service: Cardiovascular;  Laterality: N/A;   DILATION AND CURETTAGE OF UTERUS     ESOPHAGOGASTRODUODENOSCOPY N/A 07/10/2016   Dr.Fields- normal esophagus, gastric stenosis was found at the pylorus, gastritis on bx, normal examined duodenun   ESOPHAGOGASTRODUODENOSCOPY (EGD) WITH PROPOFOL N/A 11/21/2022   Procedure: ESOPHAGOGASTRODUODENOSCOPY (EGD) WITH PROPOFOL;  Surgeon: Dolores Frame, MD;  [facility]: [facility];  Service: Gastroenterology;  Laterality: N/A;   EXTERNAL FIXATION REMOVAL Right 10/29/2018   Procedure: REMOVAL RIGHT ANKLE BIOMET ZIMMER EXTERNAL FIXATOR, SHORT LEG CAST APPLICATION;  Surgeon: Eldred Manges, MD;  Location: [facility] OR;  Service: Orthopedics;  Laterality: Right;   INSERTION OF DIALYSIS CATHETER  05/26/2023   Procedure: INSERTION OF Left internal jugular DIALYSIS CATHETER;  Surgeon: Victorino Sparrow, MD;  Location: [facility] OR;  Service: Vascular;;   IR FLUORO GUIDE  CV LINE LEFT  08/28/2023   IR FLUORO GUIDE CV LINE RIGHT  09/24/2023   IR PATIENT EVAL TECH 0-60 MINS  09/24/2023   IR US GUIDE VASC ACCESS LEFT  08/28/2023   IR US GUIDE VASC ACCESS RIGHT  09/24/2023   MASTECTOMY     left sided   ORIF ANKLE FRACTURE Right 10/06/2018   Procedure: OPEN REDUCTION INTERNAL FIXATION (ORIF) RIGHT ANKLE TRIMALLEOLAR;  Surgeon: Eldred Manges, MD;  Location: [facility] OR;  Service: Orthopedics;  Laterality: Right;   PERIPHERAL VASCULAR BALLOON ANGIOPLASTY  08/13/2023   Procedure: PERIPHERAL VASCULAR BALLOON ANGIOPLASTY;  Surgeon: Nada Libman, MD;  [facility]: [facility];  Service: Cardiovascular;;   REFRACTIVE SURGERY Right 01/30/2024   REVISON OF ARTERIOVENOUS FISTULA Left  05/26/2023   Procedure: REVISON OF ARTERIOVENOUS FISTULA LEFT ARM HEMATOMA WASH OUT;  Surgeon: Victorino Sparrow, MD;  Location: [facility] OR;  Service: Vascular;  Laterality: Left;   RIGHT/LEFT HEART CATH AND CORONARY ANGIOGRAPHY N/A 11/27/2022   Procedure: RIGHT/LEFT HEART CATH AND CORONARY ANGIOGRAPHY;  Surgeon: Swaziland, Peter M, MD;  Location: [facility] INVASIVE CV LAB;  Service: Cardiovascular;  Laterality: N/A;   TEE WITHOUT CARDIOVERSION N/A 09/18/2023   Procedure: TRANSESOPHAGEAL ECHOCARDIOGRAM (TEE);  Surgeon: Jonelle Sidle, MD;  Location: [facility] ORS;  Service: Cardiovascular;  Laterality: N/A;   Social History   Socioeconomic History   Marital status: Widowed    Spouse name: Not on file   Number of children: Not on file   Years of education: Not on file   Highest education level: Not on file  Occupational History   Not on file  Tobacco Use   Smoking status: Never   Smokeless tobacco: Never  Vaping Use   Vaping status: Never Used  Substance and Sexual Activity   Alcohol use: No    Alcohol/week: 0.0 standard drinks of alcohol   Drug use: No   Sexual activity: Not Currently  Other Topics Concern   Not on file  Social History Narrative   Lives with son, Jamar.  No pets.   Social Drivers of Corporate investment banker Strain: Low Risk  (11/12/2023)   Received from [facility]   Overall Financial Resource Strain (CARDIA)    Difficulty of Paying Living Expenses: Not very hard  Food Insecurity: No Food Insecurity (11/12/2023)   Received from [facility]   Hunger Vital Sign    Worried About Running Out of Food in the Last Year: Never true    Ran Out of Food in the Last Year: Never true  Transportation Needs: Unknown (11/12/2023)   Received from [facility] - Transportation    In the past 12 months, has lack of transportation kept you from medical appointments or from getting medications?: No    Lack of Transportation  (Non-Medical): Patient declined  Physical Activity: Patient Declined (11/08/2023)   Received from [facility]   Exercise Vital Sign    Days of Exercise per Week: Patient declined    Minutes of Exercise per Session: Patient declined  Stress: Patient Declined (11/08/2023)   Received from [facility] - Occupational Stress Questionnaire    Feeling of Stress : Patient declined  Social Connections: Patient Declined (11/08/2023)   Received from [facility]   Social Connection and Isolation Panel [NHANES]    Frequency of Communication with Friends and Family: Patient declined  Frequency of Social Gatherings with Friends and Family: Patient declined    Attends Religious Services: Patient declined    Database administrator or Organizations: Patient declined    Attends Banker Meetings: Patient declined    Marital Status: Patient declined   Family History  Problem Relation Age of Onset   Diabetes Mellitus II Mother    Hypertension Mother    Heart block Mother    Hypertension Sister    Hypertension Sister    Colon cancer Neg Hx    Outpatient Encounter Medications as of 01/30/2024  Medication Sig   traMADol (ULTRAM) 50 MG tablet Take 50 mg by mouth 2 (two) times daily.   acetaminophen (TYLENOL) 325 MG tablet Take 650 mg by mouth every 6 (six) hours as needed for mild pain or fever.   albuterol (VENTOLIN HFA) 108 (90 Base) MCG/ACT inhaler Inhale 2 puffs into the lungs every 6 (six) hours as needed for wheezing or shortness of breath.   ALPRAZolam (XANAX) 0.5 MG tablet Take 0.5 mg by mouth at bedtime as needed for anxiety or sleep.   aspirin EC 81 MG tablet Take 1 tablet (81 mg total) by mouth daily. Swallow whole.   ceFAZolin (ANCEF) 2-4 GM/100ML-% IVPB Inject 100 mLs (2 g total) into the vein every Monday, Wednesday, and Friday with hemodialysis. Last dose on 10/30/23 on dialysis    cetirizine (ZYRTEC) 10 MG tablet Take 1 tablet (10 mg total) by mouth daily.   Cholecalciferol (VITAMIN D-3 PO) Take 1 tablet by mouth daily.   cinacalcet (SENSIPAR) 30 MG tablet Take 30 mg by mouth daily.   docusate sodium (COLACE) 100 MG capsule Take 100 mg by mouth daily.   famotidine (PEPCID) 20 MG tablet Take 1 tablet (20 mg total) by mouth daily. As needed for breakthrough reflux.   ferric citrate (AURYXIA) 1 GM 210 MG(Fe) tablet Take 420 mg by mouth 2 (two) times daily with a meal. With Breakfast & with supper   glipiZIDE (GLUCOTROL) 5 MG tablet Take 1 tablet (5 mg total) by mouth daily before breakfast.   ibuprofen (ADVIL) 800 MG tablet Take 800 mg by mouth every 8 (eight) hours as needed.   insulin degludec (TRESIBA FLEXTOUCH) 200 UNIT/ML FlexTouch Pen Inject 30 Units into the skin at bedtime.   ipratropium (ATROVENT) 0.06 % nasal spray Place 2 sprays into both nostrils daily as needed for rhinitis.   irbesartan (AVAPRO) 75 MG tablet Take 75 mg by mouth See admin instructions. Take 75 mg at night on non-dialysis days (Sunday, Tuesday, Thursday, Saturday)   labetalol (NORMODYNE) 100 MG tablet Take 100 mg by mouth 2 (two) times daily.   linaclotide (LINZESS) 290 MCG CAPS capsule Take 290 mcg by mouth daily before breakfast.   montelukast (SINGULAIR) 10 MG tablet Take 1 tablet (10 mg total) by mouth at bedtime.   multivitamin (RENA-VIT) TABS tablet Take 1 tablet by mouth daily.   NIFEdipine (ADALAT CC) 30 MG 24 hr tablet Take 30 mg by mouth See admin instructions. Take 30 mg at night on non-dialysis days (Sunday, Tuesday, Thursday, Saturday)   ondansetron (ZOFRAN-ODT) 4 MG disintegrating tablet Take 1 tablet (4 mg total) by mouth every 8 (eight) hours as needed for nausea or vomiting.   oxyCODONE (OXY IR/ROXICODONE) 5 MG immediate release tablet Take 1 tablet (5 mg total) by mouth every 6 (six) hours as needed for moderate pain.   OXYGEN Inhale 2 L into the lungs continuous.   pantoprazole  (PROTONIX) 40  MG tablet Take 1 tablet (40 mg total) by mouth daily. 30 minutes before breakfast   Polyvinyl Alcohol-Povidone (REFRESH OP) Place 1 drop into both eyes daily as needed (dry eyes).   rOPINIRole (REQUIP XL) 2 MG 24 hr tablet Take 2 mg by mouth at bedtime.    rosuvastatin (CRESTOR) 20 MG tablet Take 20 mg by mouth daily.   sevelamer carbonate (RENVELA) 800 MG tablet Take 1,600-2,400 mg by mouth See admin instructions. Take 1600 mg at breakfast, 2400 mg at lunch and 1600 mg at dinner   [DISCONTINUED] insulin degludec (TRESIBA FLEXTOUCH) 200 UNIT/ML FlexTouch Pen Inject 30 Units into the skin at bedtime.   No facility-administered encounter medications on file as of 01/30/2024.   ALLERGIES: Allergies  Allergen Reactions   Norvasc [Amlodipine Besylate] Rash and Other (See Comments)    Dizziness    Reglan [Metoclopramide] Other (See Comments)    Hallucinations     VACCINATION STATUS: Immunization History  Administered Date(s) Administered   Influenza Split 09/20/2015   Influenza,trivalent, recombinat, inj, PF 09/20/2015   Influenza-Unspecified 09/30/2014, 08/25/2015, 09/06/2016, 08/19/2017, 10/04/2017, 10/15/2018, 09/25/2019, 10/03/2020, 08/31/2022   Moderna SARS-COV2 Booster Vaccination 11/07/2021   Moderna Sars-Covid-2 Vaccination 03/02/2020, 03/30/2020, 05/02/2020, 12/16/2020   PPD Test 08/10/2015, 09/06/2015, 02/10/2016, 09/09/2017, 10/20/2018, 11/23/2019, 11/30/2020, 11/08/2021, 12/06/2021   Pneumococcal Polysaccharide-23 01/03/2022   Pneumococcal-Unspecified 03/02/2016, 02/20/2017   Unspecified SARS-COV-2 Vaccination 03/02/2020, 03/30/2020    HPI Patricia [name] is 53 y.o. female who presents today with a medical history as above. she is being seen in follow-up after she was seen in consultation for management of type 2 diabetes complicated by ESRD, nodular goiter requested by [name], PA-C.   She is status post fine-needle aspiration biopsy with benign  outcomes.  She has no new complaints about her thyroid today.   Her most recent thyroid ultrasound is consistent with stable findings compared to her prior imaging in January 2023. Her recent thyroid function tests are consistent with euthyroid state. She denies any prior history of thyroid dysfunction.  She denies dysphagia, shortness of breath, nor voice change.  She denies any family history of thyroid malignancy.    However, patient has personal history of breast cancer, status post lobectomy in 2019. Her type 2 diabetes is complicated by ESRD on hemodialysis.   She returns with improved glycemic control.  Her recent A1c of 6.9%, progressively improving from 9.5%.  She is on Tresiba 30 units nightly, glipizide 5 mg p.o. daily at breakfast.    The cause of  ESRD is said to be hypertension, diabetes.     She is increasingly getting comfortable using insulin She  has advanced COPD/asthma. Patient is on multiple medications.  Not currently on steroids for  Review of Systems  Constitutional: + She reports fluctuating body weight, + fatigue, no subjective hyperthermia, no subjective hypothermia   Objective:       01/30/2024    1:19 PM 01/23/2024   11:33 AM 01/23/2024   11:30 AM  Vitals with BMI  Height 5\' 3"   5\' 3"   Weight 252 lbs 10 oz  255 lbs 6 oz  BMI 44.76  45.25  Systolic 148 156 409  Diastolic 86 65 66  Pulse 72 85 106    BP (!) 148/86   Pulse 72   Ht 5\' 3"  (1.6 m)   Wt 252 lb 9.6 oz (114.6 kg)   BMI 44.75 kg/m   Wt Readings from Last 3 Encounters:  01/30/24 252 lb 9.6 oz (114.6 kg)  01/23/24 255 lb 6.4 oz (115.8 kg)  11/15/23 245 lb (111.1 kg)    Physical Exam  Constitutional:  Body mass index is 44.75 kg/m.,  not in acute distress, normal state of mind Eyes: PERRLA, EOMI, no exophthalmos ENT: moist mucous membranes, + gross thyromegaly, no gross cervical lymphadenopathy   CMP ( most recent) CMP     Component Value Date/Time   NA 131 (L) 09/24/2023 0409   K  4.8 09/24/2023 0409   CL 91 (L) 09/24/2023 0409   CO2 20 (L) 09/24/2023 0409   GLUCOSE 218 (H) 09/24/2023 0409   BUN 86 (H) 09/24/2023 0409   CREATININE 15.73 (H) 09/24/2023 0409   CREATININE 5.93 (H) 06/29/2019 1210   CALCIUM 9.5 09/24/2023 0409   PROT 7.7 09/14/2023 1716   PROT 7.8 05/17/2022 1607   ALBUMIN 3.5 09/24/2023 0409   ALBUMIN 4.9 05/17/2022 1607   AST 17 09/14/2023 1716   ALT 13 09/14/2023 1716   ALKPHOS 88 09/14/2023 1716   BILITOT 0.4 09/14/2023 1716   BILITOT 0.4 05/17/2022 1607   GFRNONAA 2 (L) 09/24/2023 0409   GFRNONAA 8 (L) 06/29/2019 1210   GFRAA 4 (L) 08/04/2020 0452   GFRAA 9 (L) 06/29/2019 1210     Diabetic Labs (most recent): Lab Results  Component Value Date   HGBA1C 8.5 (H) 09/15/2023   HGBA1C 7.0 06/25/2023   HGBA1C 6.2 03/07/2023     Lipid Panel ( most recent) Lipid Panel     Component Value Date/Time   CHOL 189 06/19/2023 1023   TRIG 51 06/19/2023 1023   HDL 120 06/19/2023 1023   CHOLHDL 1.6 06/19/2023 1023   CHOLHDL 1.9 01/04/2022 1003   VLDL 10 01/04/2022 1003   LDLCALC 59 06/19/2023 1023   LABVLDL 10 06/19/2023 1023   Summary of her thyroid ultrasound from January 05, 2022: Right lobe 7.1 cm, 4.6 cm nodule Left lobe 4.5 cm.   Fine-needle aspiration biopsy on February 27, 2022: FINAL MICROSCOPIC DIAGNOSIS:  - Consistent with benign follicular nodule (Bethesda category II)   SPECIMEN ADEQUACY:  Satisfactory for evaluation    Assessment & Plan:   1.  Type 2 diabetes-uncontrolled complicated by ESRD 2.  Hyperlipidemia Complicated by ESRD currently on hemodialysis. Based on her continued hyperglycemic response with recent A1c of 6.9%.  She will not need prandial insulin.  She is advised to continue Tresiba 30 units nightly, willing to continue monitoring blood glucose twice a day-p.o. daily before breakfast and at bedtime.    Her recent average blood glucose is between 176-187 at fasting mainly due to exposure to steroids.      -She is encouraged to call clinic for hypoglycemia of less than 70 and persistent hyperglycemia > 200 mg /dl at fasting 3 days in a week. -She will also continue to benefit from low-dose glipizide, 5 mg XL p.o. daily at breakfast.   She is not a candidate for metformin, nor SGLT2 inhibitors. She will be considered for GLP1 RA as appropriate on subsequent visits. She has controlled lipid panel with most recent LDL of 59.  She is advised to continue Crestor 20 mg p.o. nightly.    Side effects and precautions discussed with her.    3. Nodular goiter I reviewed her recent thyroid ultrasound and thyroid function test labs with her. Her thyroid function tests are consistent with euthyroid state.  She will not need intervention with prescription.    - she has nodular goiter which was worked up completely including with fine-needle  aspiration with benign outcomes.  Her recent thyroid ultrasound shows similar findings.  She will not need surgical intervention at this time.  She does not have any compressive concerns at this time.  Due to the sheer size of her thyroid which is asymmetrically enlarged with 7.1 cm right lobe, 4.5 cm left lobe , she will need surveillance thyroid/neck ultrasound before her next visit in 6 months.     She has several comorbid conditions including type 2 diabetes, hypertension, ESRD on hemodialysis, hyperlipidemia, COPD/asthma, hyperlipidemia.  - she is advised to maintain close follow up with [name], PA-C for primary care needs.   I spent  26  minutes in the care of the patient today including review of labs from Thyroid Function, CMP, and other relevant labs ; imaging/biopsy records (current and previous including abstractions from other facilities); face-to-face time discussing  her lab results and symptoms, medications doses, her options of short and [place] term treatment based on the latest standards of care / guidelines;   and documenting the  encounter.  Jazzie Pagnotta  participated in the discussions, expressed understanding, and voiced agreement with the above plans.  All questions were answered to her satisfaction. she is encouraged to contact clinic should she have any questions or concerns prior to her return visit.     Follow up plan: Return in about 4 months (around 05/29/2024) for F/U with Pre-visit Labs, Meter/CGM/Logs, A1c here, Thyroid / Neck Ultrasound.   Marquis Lunch, MD [facility] [address] [place], [place] [address] Phone: [phone]  Fax: [phone]     01/30/2024, 5:07 PM  This note was partially dictated with voice recognition software. Similar sounding words can be transcribed inadequately or may not  be corrected upon review.

## 2024-01-30 NOTE — Therapy (Addendum)
OUTPATIENT PHYSICAL THERAPY WOUNDCARE/Discharge    Patient Name: Patricia [name] MRN: [id number] DOB:1971/04/02, 53 y.o., female Today's Date: 01/30/2024   PCP: Valentino Hue  REFERRING PROVIDER: [name], PA-C PHYSICAL THERAPY DISCHARGE SUMMARY  Visits from Start of Care: 7  Current functional level related to goals / functional outcomes: PT wounds have healed   Remaining deficits: None    Education / Equipment: Self care    Patient agrees to discharge. Patient goals were met. Patient is being discharged due to meeting the stated rehab goals.  END OF SESSION:  PT End of Session - 01/30/24 1211     Visit Number 7    Number of Visits 7   Date for PT Re-Evaluation 02/01/24    Authorization Type medicare    PT Start Time 1150    PT Stop Time 1205    PT Time Calculation (min) 15 min    Activity Tolerance Patient tolerated treatment well    Behavior During Therapy WFL for tasks assessed/performed             Past Medical History:  Diagnosis Date   Anemia    Ankle fracture    Arthritis    Asthma    Blood transfusion without reported diagnosis    Breast cancer (HCC)    Cancer (HCC)    COVID    Diabetes mellitus without complication (HCC)    Dyspnea    End stage renal disease on dialysis (HCC)    M/W/F [facility] in [place]   GERD (gastroesophageal reflux disease)    Hypertension    OSA (obstructive sleep apnea)    uses CPAP sometimes   Pneumonia    PONV (postoperative nausea and vomiting)    Wears glasses    Past Surgical History:  Procedure Laterality Date   A/V FISTULAGRAM Left 08/13/2023   Procedure: A/V Fistulagram;  Surgeon: Nada Libman, MD;  Location: [facility];  Service: Cardiovascular;  Laterality: Left;   ABDOMINAL HYSTERECTOMY     APPLICATION OF WOUND VAC Left 05/26/2023   Procedure: APPLICATION OF WOUND VAC;  Surgeon: Victorino Sparrow, MD;  Location: [facility] OR;  Service: Vascular;  Laterality: Left;   AV FISTULA PLACEMENT   11/2014   at [facility] hospital   AV FISTULA REPAIR N/A 04/2021   BALLOON DILATION N/A 07/10/2016   Procedure: BALLOON DILATION;  Surgeon: West Bali, MD;  Location: [facility];  Service: Endoscopy;  Laterality: N/A;  Pyloric dilation   BREAST LUMPECTOMY     CATARACT EXTRACTION W/PHACO Right 05/24/2023   Procedure: CATARACT EXTRACTION PHACO AND INTRAOCULAR LENS PLACEMENT (IOC);  Surgeon: [name], MD;  Location: [facility] OR[facility];  Service: Ophthalmology;  Laterality: Right;  CDE: 5.00   CESAREAN SECTION     CHOLECYSTECTOMY     COLONOSCOPY  02/2023   COLONOSCOPY WITH PROPOFOL N/A 09/27/2016   Dr. Jena Gauss: Internal hemorrhoids repeat colonoscopy in 10 years   COLONOSCOPY WITH PROPOFOL N/A 02/28/2023   Procedure: COLONOSCOPY WITH PROPOFOL;  Surgeon: Corbin Ade, MD;  Location: [facility];  Service: Endoscopy;  Laterality: N/A;  11:45 am, asa 3 dialysis pt   DIALYSIS/PERMA CATHETER INSERTION N/A 11/15/2023   Procedure: DIALYSIS/PERMA CATHETER INSERTION;  Surgeon: Nada Libman, MD;  Location: [facility] INVASIVE CV LAB;  Service: Cardiovascular;  Laterality: N/A;   DILATION AND CURETTAGE OF UTERUS     ESOPHAGOGASTRODUODENOSCOPY N/A 07/10/2016   Dr.Fields- normal esophagus, gastric stenosis was found at the pylorus, gastritis on bx, normal examined  duodenun   ESOPHAGOGASTRODUODENOSCOPY (EGD) WITH PROPOFOL N/A 11/21/2022   Procedure: ESOPHAGOGASTRODUODENOSCOPY (EGD) WITH PROPOFOL;  Surgeon: Dolores Frame, MD;  [facility]: [facility];  Service: Gastroenterology;  Laterality: N/A;   EXTERNAL FIXATION REMOVAL Right 10/29/2018   Procedure: REMOVAL RIGHT ANKLE BIOMET ZIMMER EXTERNAL FIXATOR, SHORT LEG CAST APPLICATION;  Surgeon: Eldred Manges, MD;  Location: [facility] OR;  Service: Orthopedics;  Laterality: Right;   INSERTION OF DIALYSIS CATHETER  05/26/2023   Procedure: INSERTION OF Left internal jugular DIALYSIS CATHETER;  Surgeon: Victorino Sparrow, MD;  Location: [facility] OR;  Service: Vascular;;   IR  FLUORO GUIDE CV LINE LEFT  08/28/2023   IR FLUORO GUIDE CV LINE RIGHT  09/24/2023   IR PATIENT EVAL TECH 0-60 MINS  09/24/2023   IR US GUIDE VASC ACCESS LEFT  08/28/2023   IR US GUIDE VASC ACCESS RIGHT  09/24/2023   MASTECTOMY     left sided   ORIF ANKLE FRACTURE Right 10/06/2018   Procedure: OPEN REDUCTION INTERNAL FIXATION (ORIF) RIGHT ANKLE TRIMALLEOLAR;  Surgeon: Eldred Manges, MD;  Location: [facility] OR;  Service: Orthopedics;  Laterality: Right;   PERIPHERAL VASCULAR BALLOON ANGIOPLASTY  08/13/2023   Procedure: PERIPHERAL VASCULAR BALLOON ANGIOPLASTY;  Surgeon: Nada Libman, MD;  [facility]: [facility];  Service: Cardiovascular;;   REVISON OF ARTERIOVENOUS FISTULA Left 05/26/2023   Procedure: REVISON OF ARTERIOVENOUS FISTULA LEFT ARM HEMATOMA WASH OUT;  Surgeon: Victorino Sparrow, MD;  Location: [facility] OR;  Service: Vascular;  Laterality: Left;   RIGHT/LEFT HEART CATH AND CORONARY ANGIOGRAPHY N/A 11/27/2022   Procedure: RIGHT/LEFT HEART CATH AND CORONARY ANGIOGRAPHY;  Surgeon: Swaziland, Peter M, MD;  Location: [facility] INVASIVE CV LAB;  Service: Cardiovascular;  Laterality: N/A;   TEE WITHOUT CARDIOVERSION N/A 09/18/2023   Procedure: TRANSESOPHAGEAL ECHOCARDIOGRAM (TEE);  Surgeon: Jonelle Sidle, MD;  Location: [facility];  Service: Cardiovascular;  Laterality: N/A;   Patient Active Problem List   Diagnosis Date Noted   Staphylococcus aureus bacteremia 09/18/2023   Bacteremia 09/16/2023   Respiratory failure (HCC) 09/14/2023   Severe sepsis (HCC) 09/14/2023   Type 2 diabetes mellitus with hyperlipidemia (HCC) 09/14/2023   Acute anemia 06/03/2023   Complication of AV dialysis fistula 05/26/2023   Anemia of chronic disease 05/26/2023   Mixed hyperlipidemia 03/23/2023   Normocytic anemia 02/12/2023   Abdominal pain 02/12/2023   Chronic respiratory failure with hypoxia (HCC) 01/15/2023   Other specified disorders of bone density and structure, other site 01/02/2023   Acute on chronic hypoxic  respiratory failure (HCC) 12/10/2022   Precordial chest pain 11/26/2022   ESRD needing dialysis (HCC) 11/20/2022   Class 3 obesity 11/19/2022   BOOP (bronchiolitis obliterans with organizing pneumonia) (HCC) 11/19/2022   Bronchiolitis obliterans (HCC) 11/19/2022   Post-traumatic osteoarthritis, right ankle and foot 08/31/2022   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   Elevated alkaline phosphatase level 05/24/2022   Right foot sprain 12/20/2020   Contusion of left shoulder 12/20/2020   Syncope 08/03/2020   Acute pulmonary edema (HCC) 01/11/2020   OSA (obstructive sleep apnea)    Closed right ankle fracture 10/06/2018   Closed displaced trimalleolar fracture of right ankle 10/06/2018   Acute colitis 02/26/2018   Hypoxemia 02/24/2018   Dyspnea 11/26/2017   Acute on chronic anemia 11/26/2017   Elevated troponin 11/26/2017   Hyperkalemia 09/29/2017   Acute on chronic respiratory failure with hypoxia (HCC) 09/28/2017   CAP (community acquired pneumonia) 09/26/2017   Volume overload 09/25/2017   Acute on chronic diastolic  CHF (congestive heart failure) (HCC) 09/25/2017   Lactic acidosis 09/25/2017   Hypokalemia 09/25/2017   Breast cancer (HCC) 07/22/2017   SOB (shortness of breath)    Acute respiratory failure with hypoxia (HCC) 07/21/2017   Flatulence 10/25/2016   GERD (gastroesophageal reflux disease) 08/02/2016   Pyloric stenosis, acquired    Nausea with vomiting    Generalized abdominal pain    Chest pain, rule out acute myocardial infarction 07/04/2016   Constipation 07/04/2016   Nausea 07/04/2016   Essential hypertension 07/04/2016   HCAP (healthcare-associated pneumonia) 12/04/2015   ESRD on dialysis (HCC) 12/04/2015   Diabetes mellitus with end-stage renal disease (HCC) 12/04/2015    ONSET DATE: 10/10/24  REFERRING DIAG:  Diagnosis  S81.801D (ICD-10-CM) - Unspecified open wound, right lower leg, subsequent encounter    THERAPY DIAG:  Diagnosis  S81.801D (ICD-10-CM) -  Unspecified open wound, right lower leg, subsequent encounter    Rationale for Evaluation and Treatment: Rehabilitation     Wound Therapy - 01/30/24 0001     Subjective Pt had laser eye surgery earlier today.  No reports of pain LE.  Arrived with dressings intact, have slid down some    Patient and Family Stated Goals wounds to heal    Date of Onset 10/10/24    Prior Treatments various ointments and antibiotics    Pain Scale 0-10    Pain Score 0-No pain    Evaluation and Treatment Procedures Explained to Patient/Family Yes    Evaluation and Treatment Procedures agreed to    Wound Properties Date First Assessed: 01/02/24 Time First Assessed: 1350 Wound Type: Other (Comment) Location: Leg Location Orientation: Right;Medial;Posterior Wound Description (Comments): multiple small wounds Present on Admission: Yes   Wound Image Images linked: 1    Dressing Type --   Pt donn compression garment   Dressing Changed --   no new dressings, pt donned compression garment   Dressing Status None    Site / Wound Assessment Granulation tissue    % Wound base Red or Granulating 100%    % Wound base Yellow/Fibrinous Exudate 0%    Peri-wound Assessment Intact    Treatment Cleansed    Wound Therapy - Clinical Statement see below    Factors Delaying/Impairing Wound Healing Diabetes Mellitus;Multiple medical problems;Polypharmacy;Vascular compromise;Other (comment)   End stage kidney disease   Hydrotherapy Plan Debridement;Dressing change;Patient/family education    Wound Therapy - Frequency 2X / week    Wound Therapy - Current Recommendations PT    Wound Plan DC to self care               PATIENT EDUCATION: Education details: keep dressing dry , remove if it becomes painful Person educated: Patient Education method: Explanation Education comprehension: verbalized understanding   HOME EXERCISE PROGRAM: N/A   GOALS: Goals reviewed with patient? No  SHORT TERM GOALS: Target date:  01/18/24  PT wounds to be 100% granulated Baseline: Goal status: MET  2.  Pt pain to be no greater than a 3/10 Baseline:  Goal status: MET   [place] TERM GOALS: Target date: 02/01/24  Pt wound to be healed  Baseline:  Goal status: MET  2.  PT to have no pain in her Right LE  Baseline:  Goal status: MET   ASSESSMENT:  CLINICAL IMPRESSION: 01/30/24:  Upon removal of dressings, wounds fully healed.  Reviewed self care with importance of moisturizing skin regularly and compression garments for edema control.  Pt brought in compression garments, able to donn garment independently.  DC to self care.  Eval:Patient is a 53 y.o. female who was seen today for physical therapy evaluation and treatment for multiple non healing wounds on pt Rt LE.  Pt states that her port became infected in October, she was admitted to the hospital for IV antibiotics.  She did not have anything on her legs but a day after she got home she went to the MD as she had red spots all over her legs which turned into wounds.   She has tried medication and has been on antibiotics.  This helped the Lt leg but the Rt leg continues to have multiple wounds on it.   Ms.  Renzulli will benefit from skilled PT to create a more healing environment and debride her wounds to allow healing to occur.    OBJECTIVE IMPAIRMENTS: increased edema, pain, and decreased skin integrity .   ACTIVITY LIMITATIONS: bathing and dressing   PERSONAL FACTORS: Fitness, Time since onset of injury/illness/exacerbation, and 1-2 comorbidities: DM, end stage renal failure  are also affecting patient's functional outcome.   REHAB POTENTIAL: Good  CLINICAL DECISION MAKING: Stable/uncomplicated  EVALUATION COMPLEXITY: Low  PLAN: PT FREQUENCY: 2x/week  PT DURATION: 4 weeks  PLANNED INTERVENTIONS: 97535- Self Care, 11914- Manual therapy, 97597- Wound care (first 20 sq cm), and Patient/Family education  PLAN FOR NEXT SESSION: DC to self care  Becky Sax, LPTA/CLT; CBIS [phone] Virgina Organ, PT CLT [phone]  01/30/2024, 12:14 PM

## 2024-01-30 NOTE — Patient Instructions (Signed)

## 2024-01-31 DIAGNOSIS — N2581 Secondary hyperparathyroidism of renal origin: Secondary | ICD-10-CM | POA: Diagnosis not present

## 2024-01-31 DIAGNOSIS — D509 Iron deficiency anemia, unspecified: Secondary | ICD-10-CM | POA: Diagnosis not present

## 2024-01-31 DIAGNOSIS — Z992 Dependence on renal dialysis: Secondary | ICD-10-CM | POA: Diagnosis not present

## 2024-01-31 DIAGNOSIS — D631 Anemia in chronic kidney disease: Secondary | ICD-10-CM | POA: Diagnosis not present

## 2024-01-31 DIAGNOSIS — N186 End stage renal disease: Secondary | ICD-10-CM | POA: Diagnosis not present

## 2024-01-31 DIAGNOSIS — N250 Renal osteodystrophy: Secondary | ICD-10-CM | POA: Diagnosis not present

## 2024-02-03 ENCOUNTER — Other Ambulatory Visit: Payer: Self-pay

## 2024-02-03 ENCOUNTER — Telehealth: Payer: Self-pay | Admitting: *Deleted

## 2024-02-03 DIAGNOSIS — N2581 Secondary hyperparathyroidism of renal origin: Secondary | ICD-10-CM | POA: Diagnosis not present

## 2024-02-03 DIAGNOSIS — E1122 Type 2 diabetes mellitus with diabetic chronic kidney disease: Secondary | ICD-10-CM

## 2024-02-03 DIAGNOSIS — D509 Iron deficiency anemia, unspecified: Secondary | ICD-10-CM | POA: Diagnosis not present

## 2024-02-03 DIAGNOSIS — N186 End stage renal disease: Secondary | ICD-10-CM | POA: Diagnosis not present

## 2024-02-03 DIAGNOSIS — N250 Renal osteodystrophy: Secondary | ICD-10-CM | POA: Diagnosis not present

## 2024-02-03 DIAGNOSIS — D631 Anemia in chronic kidney disease: Secondary | ICD-10-CM | POA: Diagnosis not present

## 2024-02-03 DIAGNOSIS — Z992 Dependence on renal dialysis: Secondary | ICD-10-CM | POA: Diagnosis not present

## 2024-02-03 MED ORDER — TOUJEO SOLOSTAR 300 UNIT/ML ~~LOC~~ SOPN
30.0000 [IU] | PEN_INJECTOR | Freq: Every day | SUBCUTANEOUS | 0 refills | Status: AC
Start: 2024-02-03 — End: ?

## 2024-02-03 NOTE — Telephone Encounter (Signed)
Patient was advised to bring SD card to appointment tomorrow. Nothing Further Needed.

## 2024-02-03 NOTE — Telephone Encounter (Signed)
Patricia [name] is scheduled with Beth for a followup tomorrow 02/04/24 I looked at [facility] for CPAP DL and it appears she is not using her machine  I called Patricia [name] to ask about this, and there was no answer- LMTCB   If Patricia [name] calls back needs to be advised to bring her SD card to visit if she has been using her CPAP

## 2024-02-04 ENCOUNTER — Ambulatory Visit (INDEPENDENT_AMBULATORY_CARE_PROVIDER_SITE_OTHER): Payer: Medicare Other | Admitting: Ophthalmology

## 2024-02-04 ENCOUNTER — Ambulatory Visit: Payer: Medicare Other | Admitting: Primary Care

## 2024-02-04 ENCOUNTER — Encounter (INDEPENDENT_AMBULATORY_CARE_PROVIDER_SITE_OTHER): Payer: Self-pay | Admitting: Ophthalmology

## 2024-02-04 ENCOUNTER — Ambulatory Visit (HOSPITAL_COMMUNITY): Payer: Medicare Other

## 2024-02-04 VITALS — BP 164/87 | HR 77

## 2024-02-04 DIAGNOSIS — H35033 Hypertensive retinopathy, bilateral: Secondary | ICD-10-CM

## 2024-02-04 DIAGNOSIS — Z794 Long term (current) use of insulin: Secondary | ICD-10-CM | POA: Diagnosis not present

## 2024-02-04 DIAGNOSIS — E113513 Type 2 diabetes mellitus with proliferative diabetic retinopathy with macular edema, bilateral: Secondary | ICD-10-CM

## 2024-02-04 DIAGNOSIS — I10 Essential (primary) hypertension: Secondary | ICD-10-CM | POA: Diagnosis not present

## 2024-02-04 DIAGNOSIS — Z961 Presence of intraocular lens: Secondary | ICD-10-CM | POA: Diagnosis not present

## 2024-02-04 DIAGNOSIS — H25812 Combined forms of age-related cataract, left eye: Secondary | ICD-10-CM | POA: Diagnosis not present

## 2024-02-04 DIAGNOSIS — H33103 Unspecified retinoschisis, bilateral: Secondary | ICD-10-CM | POA: Diagnosis not present

## 2024-02-04 MED ORDER — AFLIBERCEPT 2MG/0.05ML IZ SOLN FOR KALEIDOSCOPE
2.0000 mg | INTRAVITREAL | Status: AC | PRN
  Administered 2024-02-04: 2 mg via INTRAVITREAL

## 2024-02-05 ENCOUNTER — Encounter (INDEPENDENT_AMBULATORY_CARE_PROVIDER_SITE_OTHER): Payer: Self-pay | Admitting: Ophthalmology

## 2024-02-05 DIAGNOSIS — N250 Renal osteodystrophy: Secondary | ICD-10-CM | POA: Diagnosis not present

## 2024-02-05 DIAGNOSIS — N186 End stage renal disease: Secondary | ICD-10-CM | POA: Diagnosis not present

## 2024-02-05 DIAGNOSIS — D631 Anemia in chronic kidney disease: Secondary | ICD-10-CM | POA: Diagnosis not present

## 2024-02-05 DIAGNOSIS — Z992 Dependence on renal dialysis: Secondary | ICD-10-CM | POA: Diagnosis not present

## 2024-02-05 DIAGNOSIS — N2581 Secondary hyperparathyroidism of renal origin: Secondary | ICD-10-CM | POA: Diagnosis not present

## 2024-02-05 DIAGNOSIS — D509 Iron deficiency anemia, unspecified: Secondary | ICD-10-CM | POA: Diagnosis not present

## 2024-02-05 NOTE — Telephone Encounter (Signed)
Per Burna Mortimer at [facility] patient IS NOT using CPAP. Last download to [employer] was March 2024. CPAP is connected and is not showing any current usage data.

## 2024-02-06 ENCOUNTER — Ambulatory Visit (HOSPITAL_COMMUNITY)
Admission: RE | Admit: 2024-02-06 | Discharge: 2024-02-06 | Disposition: A | Discharge status: Home / Self Care | Payer: Medicare Other | Source: Ambulatory Visit | Attending: Adult Health | Admitting: Adult Health

## 2024-02-06 ENCOUNTER — Ambulatory Visit (HOSPITAL_COMMUNITY): Payer: Medicare Other

## 2024-02-06 ENCOUNTER — Ambulatory Visit (INDEPENDENT_AMBULATORY_CARE_PROVIDER_SITE_OTHER): Payer: Medicare Other | Admitting: Adult Health

## 2024-02-06 ENCOUNTER — Encounter: Payer: Self-pay | Admitting: Adult Health

## 2024-02-06 VITALS — BP 152/88 | HR 77 | Ht 63.0 in | Wt 252.0 lb

## 2024-02-06 DIAGNOSIS — J4481 Bronchiolitis obliterans and bronchiolitis obliterans syndrome: Secondary | ICD-10-CM | POA: Diagnosis not present

## 2024-02-06 DIAGNOSIS — J219 Acute bronchiolitis, unspecified: Secondary | ICD-10-CM | POA: Diagnosis not present

## 2024-02-06 DIAGNOSIS — G4733 Obstructive sleep apnea (adult) (pediatric): Secondary | ICD-10-CM

## 2024-02-06 DIAGNOSIS — J849 Interstitial pulmonary disease, unspecified: Secondary | ICD-10-CM

## 2024-02-06 DIAGNOSIS — N186 End stage renal disease: Secondary | ICD-10-CM

## 2024-02-06 DIAGNOSIS — Z992 Dependence on renal dialysis: Secondary | ICD-10-CM

## 2024-02-06 DIAGNOSIS — R0989 Other specified symptoms and signs involving the circulatory and respiratory systems: Secondary | ICD-10-CM | POA: Diagnosis not present

## 2024-02-06 DIAGNOSIS — R059 Cough, unspecified: Secondary | ICD-10-CM | POA: Diagnosis not present

## 2024-02-06 DIAGNOSIS — J8489 Other specified interstitial pulmonary diseases: Secondary | ICD-10-CM

## 2024-02-06 DIAGNOSIS — R051 Acute cough: Secondary | ICD-10-CM

## 2024-02-06 DIAGNOSIS — U071 COVID-19: Secondary | ICD-10-CM | POA: Insufficient documentation

## 2024-02-06 DIAGNOSIS — J9611 Chronic respiratory failure with hypoxia: Secondary | ICD-10-CM

## 2024-02-06 LAB — POCT INFLUENZA A/B
Influenza A, POC: NEGATIVE
Influenza B, POC: NEGATIVE

## 2024-02-06 LAB — POC COVID19 BINAXNOW: SARS Coronavirus 2 Ag: POSITIVE — AB

## 2024-02-06 MED ORDER — MOLNUPIRAVIR EUA 200MG CAPSULE: 4.0000 | ORAL_CAPSULE | Freq: Two times a day (BID) | ORAL | 0 refills | Status: AC

## 2024-02-06 MED ORDER — CETIRIZINE HCL 10 MG PO TABS: 10.0000 mg | ORAL_TABLET | Freq: Every day | ORAL | 11 refills | Status: AC | PRN

## 2024-02-06 MED ORDER — AMOXICILLIN-POT CLAVULANATE 875-125 MG PO TABS: 1.0000 | ORAL_TABLET | Freq: Two times a day (BID) | ORAL | 0 refills | Status: AC

## 2024-02-06 NOTE — Patient Instructions (Addendum)
Chest xray today  Begin Molnupiravir Twice daily  for 5 days.  Augmentin 875mg  Twice daily  for 7 days , take with food.  Mucinex DM Twice daily  As needed  cough/congestion  Set up HRCT chest  Continue on Symbicort 2 puffs Twice daily, rinse after use.  Singulair 10mg  At bedtime   Albuterol inhaler or neb As needed   Continue on Zytec 10mg  daily .  Flonase and Ipratropium nasal spray As needed   Saline nasal rinses Twice daily   Saline nasal gel At bedtime   Restart CPAP At bedtime -wear all night [place] .  Work on healthy weight loss .  Do not drive if sleepy  Continue on Oxygen 2l/ rest and needs 3L continuous with exertion  Follow up in 2 weeks and As needed   Please contact office for sooner follow up if symptoms do not improve or worsen or seek emergency care   Follow up with Dr. Marchelle Gearing or Dr. Isaiah Serge  in 3 months with PFT and As needed  -ILD clinic

## 2024-02-06 NOTE — Assessment & Plan Note (Deleted)
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  Augmentin 875mg  Twice daily  for 7 days , take with food.  Mucinex DM Twice daily  As needed  cough/congestion  Set up HRCT chest  Continue on Symbicort 2 puffs Twice daily, rinse after use.  Singulair 10mg  At bedtime   Albuterol inhaler or neb As needed   Continue on Zytec 10mg  daily .  Flonase and Ipratropium nasal spray As needed   Saline nasal rinses Twice daily   Saline nasal gel At bedtime   Restart CPAP At bedtime -wear all night [place] .  Work on healthy weight loss .  Do not drive if sleepy  Continue on Oxygen 2l/ rest and needs 3L continuous with exertion  Follow up in 2 weeks and As needed   Please contact office for sooner follow up if symptoms do not improve or worsen or seek emergency care   Follow up with Dr. Marchelle Gearing or Dr. Isaiah Serge  in 3 months with PFT and As needed  -ILD clinic

## 2024-02-06 NOTE — Assessment & Plan Note (Signed)
Chronic respiratory failure continue on oxygen to maintain O2 saturations greater than 88 to 90%.  No increased oxygen demands.

## 2024-02-06 NOTE — Assessment & Plan Note (Signed)
Continue follow-up with nephrology and dialysis center.  Advised to notify dialysis team that she is positive for COVID.

## 2024-02-06 NOTE — Assessment & Plan Note (Signed)
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  Augmentin 875mg  Twice daily  for 7 days , take with food.  Mucinex DM Twice daily  As needed  cough/congestion  Set up HRCT chest  Continue on Symbicort 2 puffs Twice daily, rinse after use.  Singulair 10mg  At bedtime   Albuterol inhaler or neb As needed   Continue on Zytec 10mg  daily .  Flonase and Ipratropium nasal spray As needed   Saline nasal rinses Twice daily   Saline nasal gel At bedtime   Restart CPAP At bedtime -wear all night [place] .  Work on healthy weight loss .  Do not drive if sleepy  Continue on Oxygen 2l/ rest and needs 3L continuous with exertion  Follow up in 2 weeks and As needed   Please contact office for sooner follow up if symptoms do not improve or worsen or seek emergency care   Follow up with Dr. Marchelle Gearing or Dr. Isaiah Serge  in 3 months with PFT and As needed  -ILD clinic

## 2024-02-06 NOTE — Assessment & Plan Note (Signed)
Encouraged to restart CPAP at bedtime.  Plan  Patient Instructions  Chest xray today  Begin Molnupiravir Twice daily  for 5 days.  Augmentin 875mg  Twice daily  for 7 days , take with food.  Mucinex DM Twice daily  As needed  cough/congestion  Set up HRCT chest  Continue on Symbicort 2 puffs Twice daily, rinse after use.  Singulair 10mg  At bedtime   Albuterol inhaler or neb As needed   Continue on Zytec 10mg  daily .  Flonase and Ipratropium nasal spray As needed   Saline nasal rinses Twice daily   Saline nasal gel At bedtime   Restart CPAP At bedtime -wear all night [place] .  Work on healthy weight loss .  Do not drive if sleepy  Continue on Oxygen 2l/ rest and needs 3L continuous with exertion  Follow up in 2 weeks and As needed   Please contact office for sooner follow up if symptoms do not improve or worsen or seek emergency care   Follow up with Dr. Marchelle Gearing or Dr. Isaiah Serge  in 3 months with PFT and As needed  -ILD clinic

## 2024-02-06 NOTE — Progress Notes (Signed)
@Patient  ID: Patricia [name], female    DOB: [birth year], 53 y.o.   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, chronic respiratory failure on oxygen with activity and at bedtime and obstructive sleep apnea Complex medical history with end-stage renal disease on dialysis, chronic diastolic heart failure, pulmonary hypertension, coronary artery disease, breast cancer history  TEST/EVENTS :  PFT 01/09/18 >> FEV1 1.60 (70%), FEV1% 89, TLC 3.26 (66%), DLCO 55% PFT 10/17/21 >> FEV1 1.28 (57%), FEV1% 89, TLC 3.28 (66%), DLCO 27% Serology 10/31/21 >> ANCA, RF negative; ESR 11 PFT 10/24/22 >> FEV1 1.51 (56%), FEV1% 86, TLC 3.56 (72%), DLCO 33%   Chest Imaging:  CT angio chest 06/26/21 >> mosaic attenuation of the airspaces, bandlike scarring at bases HRCT chest 10/11/21 >> mild, mosaic attenuation of airspaces, mild air trapping, scarring at bases CT angio chest 11/25/21 >> diffuse b/l GGO and consolidation concerning for pneumonia HRCT chest 10/30/22 >> very mild diffuse ground-glass attenuation and interlobular septal thickening, mild air trapping, patchy peripheral predominant areas of nodular airspace consolidation are also noted scattered throughout, mild septal thickening and subpleural reticulation   Cardiac Tests:  Echo 11/19/22 >> EF 70 to 75%, mild LVH, grade 1 DD, mild AS RHC/LHC 11/27/22 >> normal coronaries, LVEDP 18, PCWP 30/28/22, PAP 76/21/43  02/06/2024 Follow up ; ILD with obliterative bronchiolitis, oxygen dependent respiratory failure, obstructive sleep apnea Patient presents for a follow-up visit.  Patient was last seen April 2024. She has a very complicated history with end-stage renal disease  on dialysis Monday Wednesday and Friday.  She says over the last 4 days she has had increased cough, congestion, nasal drainage and stuffiness, general malaise and decreased appetite.  She is maintained on oxygen 2 L at rest and 3 L with activity.  She denies any increased oxygen demands.  She is on Symbicort twice daily.  Singulair daily.  Denies any current wheezing.  Complains that cough is congested but has a hard time getting up any mucus.  Says that due to her nasal congestion she has been unable to wear her CPAP machine. Patient has sleep apnea is on nocturnal CPAP.  Says that over the last several weeks she has had intermittent nasal congestion has not been able to tolerate her CPAP. He denies any hemoptysis, chest pain, orthopnea.  No missed dialysis.    COVID test today in office is positive.  Patient says she has no known sick contacts but says that several people have been sick at dialysis.  She does wear a mask.        Allergies  Allergen Reactions   Norvasc [Amlodipine Besylate] Rash and Other (See Comments)    Dizziness    Reglan [Metoclopramide] Other (See Comments)    Hallucinations     Immunization History  Administered Date(s) Administered   Influenza Split 09/20/2015   Influenza,trivalent, recombinat, inj, PF 09/20/2015   Influenza-Unspecified 09/30/2014, 08/25/2015, 09/06/2016, 08/19/2017, 10/04/2017, 10/15/2018, 09/25/2019, 10/03/2020, 08/31/2022   Moderna SARS-COV2 Booster Vaccination 11/07/2021   Moderna Sars-Covid-2 Vaccination 03/02/2020, 03/30/2020, 05/02/2020, 12/16/2020   PPD Test 08/10/2015, 09/06/2015, 02/10/2016, 09/09/2017, 10/20/2018, 11/23/2019, 11/30/2020, 11/08/2021, 12/06/2021   Pneumococcal Polysaccharide-23 01/03/2022   Pneumococcal-Unspecified 03/02/2016, 02/20/2017   Unspecified SARS-COV-2 Vaccination 03/02/2020, 03/30/2020  Past Medical History:  Diagnosis Date   Anemia    Ankle fracture    Arthritis    Asthma    Blood transfusion  without reported diagnosis    Breast cancer (HCC)    Cancer (HCC)    COVID    Diabetes mellitus without complication (HCC)    Dyspnea    End stage renal disease on dialysis (HCC)    M/W/F [facility] in [place]   GERD (gastroesophageal reflux disease)    Hypertension    OSA (obstructive sleep apnea)    uses CPAP sometimes   Pneumonia    PONV (postoperative nausea and vomiting)    Wears glasses     Tobacco History: Social History   Tobacco Use  Smoking Status Never  Smokeless Tobacco Never   Counseling given: Not Answered   Outpatient Medications Prior to Visit  Medication Sig Dispense Refill   acetaminophen (TYLENOL) 325 MG tablet Take 650 mg by mouth every 6 (six) hours as needed for mild pain or fever.     albuterol (VENTOLIN HFA) 108 (90 Base) MCG/ACT inhaler Inhale 2 puffs into the lungs every 6 (six) hours as needed for wheezing or shortness of breath. 1 each 3   ALPRAZolam (XANAX) 0.5 MG tablet Take 0.5 mg by mouth at bedtime as needed for anxiety or sleep.     aspirin EC 81 MG tablet Take 1 tablet (81 mg total) by mouth daily. Swallow whole. 90 tablet 3   Cholecalciferol (VITAMIN D-3 PO) Take 1 tablet by mouth daily.     cinacalcet (SENSIPAR) 30 MG tablet Take 30 mg by mouth daily.     docusate sodium (COLACE) 100 MG capsule Take 100 mg by mouth daily.     famotidine (PEPCID) 20 MG tablet Take 1 tablet (20 mg total) by mouth daily. As needed for breakthrough reflux. 30 tablet 5   ferric citrate (AURYXIA) 1 GM 210 MG(Fe) tablet Take 420 mg by mouth 2 (two) times daily with a meal. With Breakfast & with supper     glipiZIDE (GLUCOTROL) 5 MG tablet Take 1 tablet (5 mg total) by mouth daily before breakfast. 180 tablet 0   ibuprofen (ADVIL) 800 MG tablet Take 800 mg by mouth every 8 (eight) hours as needed.     insulin glargine, 1 Unit Dial, (TOUJEO SOLOSTAR) 300 UNIT/ML Solostar Pen Inject 30 Units into the skin at bedtime. 9 mL 0   ipratropium (ATROVENT) 0.06 % nasal spray  Place 2 sprays into both nostrils daily as needed for rhinitis.     irbesartan (AVAPRO) 75 MG tablet Take 75 mg by mouth See admin instructions. Take 75 mg at night on non-dialysis days (Sunday, Tuesday, Thursday, Saturday)     labetalol (NORMODYNE) 100 MG tablet Take 100 mg by mouth 2 (two) times daily.     linaclotide (LINZESS) 290 MCG CAPS capsule Take 290 mcg by mouth daily before breakfast.     montelukast (SINGULAIR) 10 MG tablet Take 1 tablet (10 mg total) by mouth at bedtime. 30 tablet 0   multivitamin (RENA-VIT) TABS tablet Take 1 tablet by mouth daily.     NIFEdipine (ADALAT CC) 30 MG 24 hr tablet Take 30 mg by mouth See admin instructions. Take 30 mg at night on non-dialysis days (Sunday, Tuesday, Thursday, Saturday)     ondansetron (ZOFRAN-ODT) 4 MG disintegrating tablet Take 1 tablet (4 mg total) by mouth every 8 (eight) hours as needed for nausea or vomiting. 60 tablet 3   oxyCODONE (OXY  IR/ROXICODONE) 5 MG immediate release tablet Take 1 tablet (5 mg total) by mouth every 6 (six) hours as needed for moderate pain. 10 tablet 0   OXYGEN Inhale 2 L into the lungs continuous.     pantoprazole (PROTONIX) 40 MG tablet Take 1 tablet (40 mg total) by mouth daily. 30 minutes before breakfast 90 tablet 3   Polyvinyl Alcohol-Povidone (REFRESH OP) Place 1 drop into both eyes daily as needed (dry eyes).     rOPINIRole (REQUIP XL) 2 MG 24 hr tablet Take 2 mg by mouth at bedtime.      rosuvastatin (CRESTOR) 20 MG tablet Take 20 mg by mouth daily.     sevelamer carbonate (RENVELA) 800 MG tablet Take 1,600-2,400 mg by mouth See admin instructions. Take 1600 mg at breakfast, 2400 mg at lunch and 1600 mg at dinner     traMADol (ULTRAM) 50 MG tablet Take 50 mg by mouth 2 (two) times daily.     ceFAZolin (ANCEF) 2-4 GM/100ML-% IVPB Inject 100 mLs (2 g total) into the vein every Monday, Wednesday, and Friday with hemodialysis. Last dose on 10/30/23 on dialysis     cetirizine (ZYRTEC) 10 MG tablet Take 1  tablet (10 mg total) by mouth daily. 30 tablet 3   No facility-administered medications prior to visit.     Review of Systems:   Constitutional:   No  weight loss, night sweats,  Fevers, chills, +fatigue, or  lassitude.  HEENT:   No headaches,  Difficulty swallowing,  Tooth/dental problems, or  Sore throat,                No sneezing, itching, ear ache, ++nasal congestion, post nasal drip,   CV:  No chest pain,  Orthopnea, PND, swelling in lower extremities, anasarca, dizziness, palpitations, syncope.   GI  No heartburn, indigestion, abdominal pain, nausea, vomiting, diarrhea, change in bowel habits, loss of appetite, bloody stools.   Resp: .  No chest wall deformity  Skin: no rash or lesions.  GU: no dysuria, change in color of urine, no urgency or frequency.  No flank pain, no hematuria   MS:  No joint pain or swelling.  No decreased range of motion.  No back pain.    Physical Exam  BP (!) 152/88   Pulse 77   Ht 5\' 3"  (1.6 m)   Wt 252 lb (114.3 kg)   SpO2 92% Comment: 2L PULSED  BMI 44.64 kg/m   GEN: A/Ox3; pleasant , NAD, well nourished    HEENT:  [place]/AT,  NOSE-clear, THROAT-clear, no lesions, no postnasal drip or exudate noted.   NECK:  Supple w/ fair ROM; no JVD; normal carotid impulses w/o bruits; no thyromegaly or nodules palpated; no lymphadenopathy.    RESP  Clear  P & A; w/o, wheezes/ rales/ or rhonchi. no accessory muscle use, no dullness to percussion  CARD:  RRR, no m/r/g, no peripheral edema, pulses intact, no cyanosis or clubbing.  GI:   Soft & nt; nml bowel sounds; no organomegaly or masses detected.   Musco: Warm bil, no deformities or joint swelling noted.   Neuro: alert, no focal deficits noted.    Skin: Warm, no lesions or rashes    Lab Results:          Latest Ref Rng & Units 10/24/2022    2:58 PM 10/17/2021    9:58 AM 01/09/2018    9:49 AM 11/18/2017    1:02 PM  PFT Results  FVC-Pre L 1.73  1.45  1.79  1.31   FVC-Predicted Pre %  51  52  63  46   FVC-Post L 1.76  1.40  1.81    FVC-Predicted Post % 52  50  64    Pre FEV1/FVC % % 85  89  88  86   Post FEV1/FCV % % 86  90  89    FEV1-Pre L 1.48  1.28  1.58  1.13   FEV1-Predicted Pre % 55  57  69  49   FEV1-Post L 1.51  1.26  1.60    DLCO uncorrected ml/min/mmHg 6.74  5.50  12.64    DLCO UNC% % 33  27  55    DLCO corrected ml/min/mmHg  6.13     DLCO COR %Predicted %  30     DLVA Predicted % 58  57  75    TLC L 3.56  3.28  3.26    TLC % Predicted % 72  66  66    RV % Predicted % 106  101  22      No results found for: "NITRICOXIDE"      Assessment & Plan:   COVID-19 virus infection COVID-19 infection-patient is high risk for decompensation with multiple comorbidities including chronic respiratory failure interstitial lung disease and end-stage renal disease.  Will treat with antiviral therapy with molnupiravir for 5 days..  Supportive care discussed in detail. Patient education given on COVID-19 infection and care Check chest x-ray . Patient advised if symptoms worsen or are not improving or she develops nausea vomiting diarrhea, progressive shortness of breath, hypoxemia, hemoptysis, chest pain she is to seek emergency room care immediately  Plan  Patient Instructions  Chest xray today  Begin Molnupiravir Twice daily  for 5 days.  Augmentin 875mg  Twice daily  for 7 days , take with food.  Mucinex DM Twice daily  As needed  cough/congestion  Set up HRCT chest  Continue on Symbicort 2 puffs Twice daily, rinse after use.  Singulair 10mg  At bedtime   Albuterol inhaler or neb As needed   Continue on Zytec 10mg  daily .  Flonase and Ipratropium nasal spray As needed   Saline nasal rinses Twice daily   Saline nasal gel At bedtime   Restart CPAP At bedtime -wear all night [place] .  Work on healthy weight loss .  Do not drive if sleepy  Continue on Oxygen 2l/ rest and needs 3L continuous with exertion  Follow up in 2 weeks and As needed   Please contact  office for sooner follow up if symptoms do not improve or worsen or seek emergency care   Follow up with Dr. Marchelle Gearing or Dr. Isaiah Serge  in 3 months with PFT and As needed  -ILD clinic      Chronic respiratory failure with hypoxia (HCC) Chronic respiratory failure continue on oxygen to maintain O2 saturations greater than 88 to 90%.  No increased oxygen demands.  ESRD on dialysis [facility]) Continue follow-up with nephrology and dialysis center.  Advised to notify dialysis team that she is positive for COVID.  Bronchiolitis obliterans Mild ILD changes with previous [place] after viral infection-needs follow-up high-resolution CT chest and PFTs Will have patient establish with our ILD team   Plan  Patient Instructions  Chest xray today  Begin Molnupiravir Twice daily  for 5 days.  Augmentin 875mg  Twice daily  for 7 days , take with food.  Mucinex DM Twice daily  As needed  cough/congestion  Set up  HRCT chest  Continue on Symbicort 2 puffs Twice daily, rinse after use.  Singulair 10mg  At bedtime   Albuterol inhaler or neb As needed   Continue on Zytec 10mg  daily .  Flonase and Ipratropium nasal spray As needed   Saline nasal rinses Twice daily   Saline nasal gel At bedtime   Restart CPAP At bedtime -wear all night [place] .  Work on healthy weight loss .  Do not drive if sleepy  Continue on Oxygen 2l/ rest and needs 3L continuous with exertion  Follow up in 2 weeks and As needed   Please contact office for sooner follow up if symptoms do not improve or worsen or seek emergency care   Follow up with Dr. Marchelle Gearing or Dr. Isaiah Serge  in 3 months with PFT and As needed  -ILD clinic      OSA (obstructive sleep apnea) Encouraged to restart CPAP at bedtime.  Plan  Patient Instructions  Chest xray today  Begin Molnupiravir Twice daily  for 5 days.  Augmentin 875mg  Twice daily  for 7 days , take with food.  Mucinex DM Twice daily  As needed  cough/congestion  Set up HRCT chest  Continue on  Symbicort 2 puffs Twice daily, rinse after use.  Singulair 10mg  At bedtime   Albuterol inhaler or neb As needed   Continue on Zytec 10mg  daily .  Flonase and Ipratropium nasal spray As needed   Saline nasal rinses Twice daily   Saline nasal gel At bedtime   Restart CPAP At bedtime -wear all night [place] .  Work on healthy weight loss .  Do not drive if sleepy  Continue on Oxygen 2l/ rest and needs 3L continuous with exertion  Follow up in 2 weeks and As needed   Please contact office for sooner follow up if symptoms do not improve or worsen or seek emergency care   Follow up with Dr. Marchelle Gearing or Dr. Isaiah Serge  in 3 months with PFT and As needed  -ILD clinic     I spent   42 minutes dedicated to the care of this patient on the date of this encounter to include pre-visit review of records, face-to-face time with the patient discussing conditions above, post visit ordering of testing, clinical documentation with the electronic health record, making appropriate referrals as documented, and communicating necessary findings to members of the patients care team.    Rubye Oaks, NP 02/06/2024

## 2024-02-06 NOTE — Assessment & Plan Note (Addendum)
COVID-19 infection-patient is high risk for decompensation with multiple comorbidities including chronic respiratory failure interstitial lung disease and end-stage renal disease.  Will treat with antiviral therapy with molnupiravir for 5 days..  Supportive care discussed in detail. Patient education given on COVID-19 infection and care Check chest x-ray . Patient advised if symptoms worsen or are not improving or she develops nausea vomiting diarrhea, progressive shortness of breath, hypoxemia, hemoptysis, chest pain she is to seek emergency room care immediately  Plan  Patient Instructions  Chest xray today  Begin Molnupiravir Twice daily  for 5 days.  Augmentin 875mg  Twice daily  for 7 days , take with food.  Mucinex DM Twice daily  As needed  cough/congestion  Set up HRCT chest  Continue on Symbicort 2 puffs Twice daily, rinse after use.  Singulair 10mg  At bedtime   Albuterol inhaler or neb As needed   Continue on Zytec 10mg  daily .  Flonase and Ipratropium nasal spray As needed   Saline nasal rinses Twice daily   Saline nasal gel At bedtime   Restart CPAP At bedtime -wear all night [place] .  Work on healthy weight loss .  Do not drive if sleepy  Continue on Oxygen 2l/ rest and needs 3L continuous with exertion  Follow up in 2 weeks and As needed   Please contact office for sooner follow up if symptoms do not improve or worsen or seek emergency care   Follow up with Dr. Marchelle Gearing or Dr. Isaiah Serge  in 3 months with PFT and As needed  -ILD clinic

## 2024-02-07 ENCOUNTER — Telehealth: Payer: Self-pay | Admitting: Internal Medicine

## 2024-02-07 DIAGNOSIS — Z992 Dependence on renal dialysis: Secondary | ICD-10-CM | POA: Diagnosis not present

## 2024-02-07 DIAGNOSIS — D631 Anemia in chronic kidney disease: Secondary | ICD-10-CM | POA: Diagnosis not present

## 2024-02-07 DIAGNOSIS — N186 End stage renal disease: Secondary | ICD-10-CM | POA: Diagnosis not present

## 2024-02-07 DIAGNOSIS — N2581 Secondary hyperparathyroidism of renal origin: Secondary | ICD-10-CM | POA: Diagnosis not present

## 2024-02-07 DIAGNOSIS — D509 Iron deficiency anemia, unspecified: Secondary | ICD-10-CM | POA: Diagnosis not present

## 2024-02-07 DIAGNOSIS — N250 Renal osteodystrophy: Secondary | ICD-10-CM | POA: Diagnosis not present

## 2024-02-07 NOTE — Telephone Encounter (Signed)
Spoke w ith patient regarding testing that was order by Rubye Oaks, NP on 02/06/20---Patient has the chest x--ray done at [facility] on 02/06/20---she has CT chest scheduled for 02/20/24 and PFT appointment is pending.  I toild patient as soon as I heard from the scheduler at [facility], I would call her with the appointment information.  She voiced her understanding.

## 2024-02-07 NOTE — Telephone Encounter (Signed)
Please call PT to clarify CT and PFT .  She states she was sent yesterday for a CT yet one is sched for 3/6.  She has questions about PFT sched as well. Her # is [phone]

## 2024-02-07 NOTE — Telephone Encounter (Signed)
Also, PT states Begin Molnupiravir Twice daily  for 5 days. Was not rec'd at the same pharm she got the other medications at.

## 2024-02-07 NOTE — Telephone Encounter (Signed)
 NFN

## 2024-02-10 ENCOUNTER — Telehealth: Payer: Self-pay | Admitting: Adult Health

## 2024-02-10 DIAGNOSIS — N186 End stage renal disease: Secondary | ICD-10-CM | POA: Diagnosis not present

## 2024-02-10 DIAGNOSIS — Z992 Dependence on renal dialysis: Secondary | ICD-10-CM | POA: Diagnosis not present

## 2024-02-10 DIAGNOSIS — N250 Renal osteodystrophy: Secondary | ICD-10-CM | POA: Diagnosis not present

## 2024-02-10 DIAGNOSIS — D631 Anemia in chronic kidney disease: Secondary | ICD-10-CM | POA: Diagnosis not present

## 2024-02-10 DIAGNOSIS — D509 Iron deficiency anemia, unspecified: Secondary | ICD-10-CM | POA: Diagnosis not present

## 2024-02-10 DIAGNOSIS — N2581 Secondary hyperparathyroidism of renal origin: Secondary | ICD-10-CM | POA: Diagnosis not present

## 2024-02-10 NOTE — Telephone Encounter (Signed)
 Spoke with patient  regarding the PFT scheduled at [facility] Tuesday 03/17/24 at 2:00 pm--arrival time is 1:45 pm--1st floor registration desk---patient states that if the information in on My Chart she will see it--will mail information to patient as well

## 2024-02-11 ENCOUNTER — Ambulatory Visit (HOSPITAL_COMMUNITY): Payer: Medicare Other

## 2024-02-11 DIAGNOSIS — D509 Iron deficiency anemia, unspecified: Secondary | ICD-10-CM | POA: Diagnosis not present

## 2024-02-11 DIAGNOSIS — N2581 Secondary hyperparathyroidism of renal origin: Secondary | ICD-10-CM | POA: Diagnosis not present

## 2024-02-11 DIAGNOSIS — N250 Renal osteodystrophy: Secondary | ICD-10-CM | POA: Diagnosis not present

## 2024-02-11 DIAGNOSIS — D631 Anemia in chronic kidney disease: Secondary | ICD-10-CM | POA: Diagnosis not present

## 2024-02-11 DIAGNOSIS — N186 End stage renal disease: Secondary | ICD-10-CM | POA: Diagnosis not present

## 2024-02-11 DIAGNOSIS — Z992 Dependence on renal dialysis: Secondary | ICD-10-CM | POA: Diagnosis not present

## 2024-02-12 DIAGNOSIS — N186 End stage renal disease: Secondary | ICD-10-CM | POA: Diagnosis not present

## 2024-02-12 DIAGNOSIS — D509 Iron deficiency anemia, unspecified: Secondary | ICD-10-CM | POA: Diagnosis not present

## 2024-02-12 DIAGNOSIS — N250 Renal osteodystrophy: Secondary | ICD-10-CM | POA: Diagnosis not present

## 2024-02-12 DIAGNOSIS — D631 Anemia in chronic kidney disease: Secondary | ICD-10-CM | POA: Diagnosis not present

## 2024-02-12 DIAGNOSIS — Z992 Dependence on renal dialysis: Secondary | ICD-10-CM | POA: Diagnosis not present

## 2024-02-12 DIAGNOSIS — N2581 Secondary hyperparathyroidism of renal origin: Secondary | ICD-10-CM | POA: Diagnosis not present

## 2024-02-13 ENCOUNTER — Ambulatory Visit (HOSPITAL_COMMUNITY): Payer: Medicare Other | Admitting: Physical Therapy

## 2024-02-14 DIAGNOSIS — N250 Renal osteodystrophy: Secondary | ICD-10-CM | POA: Diagnosis not present

## 2024-02-14 DIAGNOSIS — N186 End stage renal disease: Secondary | ICD-10-CM | POA: Diagnosis not present

## 2024-02-14 DIAGNOSIS — D509 Iron deficiency anemia, unspecified: Secondary | ICD-10-CM | POA: Diagnosis not present

## 2024-02-14 DIAGNOSIS — Z992 Dependence on renal dialysis: Secondary | ICD-10-CM | POA: Diagnosis not present

## 2024-02-14 DIAGNOSIS — N2581 Secondary hyperparathyroidism of renal origin: Secondary | ICD-10-CM | POA: Diagnosis not present

## 2024-02-14 DIAGNOSIS — D631 Anemia in chronic kidney disease: Secondary | ICD-10-CM | POA: Diagnosis not present

## 2024-02-15 DIAGNOSIS — D631 Anemia in chronic kidney disease: Secondary | ICD-10-CM | POA: Diagnosis not present

## 2024-02-15 DIAGNOSIS — Z992 Dependence on renal dialysis: Secondary | ICD-10-CM | POA: Diagnosis not present

## 2024-02-15 DIAGNOSIS — N250 Renal osteodystrophy: Secondary | ICD-10-CM | POA: Diagnosis not present

## 2024-02-15 DIAGNOSIS — D509 Iron deficiency anemia, unspecified: Secondary | ICD-10-CM | POA: Diagnosis not present

## 2024-02-15 DIAGNOSIS — N2581 Secondary hyperparathyroidism of renal origin: Secondary | ICD-10-CM | POA: Diagnosis not present

## 2024-02-15 DIAGNOSIS — N186 End stage renal disease: Secondary | ICD-10-CM | POA: Diagnosis not present

## 2024-02-17 ENCOUNTER — Other Ambulatory Visit (HOSPITAL_COMMUNITY): Payer: Self-pay | Admitting: Nurse Practitioner

## 2024-02-17 DIAGNOSIS — R2231 Localized swelling, mass and lump, right upper limb: Secondary | ICD-10-CM

## 2024-02-17 DIAGNOSIS — Z992 Dependence on renal dialysis: Secondary | ICD-10-CM | POA: Diagnosis not present

## 2024-02-17 DIAGNOSIS — N186 End stage renal disease: Secondary | ICD-10-CM | POA: Diagnosis not present

## 2024-02-17 DIAGNOSIS — N2581 Secondary hyperparathyroidism of renal origin: Secondary | ICD-10-CM | POA: Diagnosis not present

## 2024-02-17 DIAGNOSIS — D631 Anemia in chronic kidney disease: Secondary | ICD-10-CM | POA: Diagnosis not present

## 2024-02-17 DIAGNOSIS — D509 Iron deficiency anemia, unspecified: Secondary | ICD-10-CM | POA: Diagnosis not present

## 2024-02-17 DIAGNOSIS — N250 Renal osteodystrophy: Secondary | ICD-10-CM | POA: Diagnosis not present

## 2024-02-18 NOTE — Progress Notes (Signed)
 [facility] - Clinic Note  03/03/2024   CHIEF COMPLAINT Patient presents for Retina Follow Up  HISTORY OF PRESENT ILLNESS: Patricia [name] is a 53 y.o. female who presents to the clinic today for:  HPI     Retina Follow Up   Patient presents with  Diabetic Retinopathy.  In both eyes.  This started 4 weeks ago.  Duration of 4 weeks.  Since onset it is stable.        Comments   4 week retina follow up PDR OU and I'VE OU pt is reporting that she had a yag OD at [facility] pt has some floaters in her right eye she denies any flashes her last reading was 112 amd A1C 6.9       Last edited by Etheleen Mayhew, COT on 03/03/2024  8:15 AM.     Pt states vision is the same.   Referring physician: [name], MD [address] [place],  [place] [address]  HISTORICAL INFORMATION:  Selected notes from the MEDICAL RECORD NUMBER Referred by Dr. June Leap for macular edema LEE:  Ocular Hx- PMH-   CURRENT MEDICATIONS: Current Outpatient Medications (Ophthalmic Drugs)  Medication Sig   Polyvinyl Alcohol-Povidone (REFRESH OP) Place 1 drop into both eyes daily as needed (dry eyes).   No current facility-administered medications for this visit. (Ophthalmic Drugs)   Current Outpatient Medications (Other)  Medication Sig   acetaminophen (TYLENOL) 325 MG tablet Take 650 mg by mouth every 6 (six) hours as needed for mild pain or fever.   albuterol (VENTOLIN HFA) 108 (90 Base) MCG/ACT inhaler Inhale 2 puffs into the lungs every 6 (six) hours as needed for wheezing or shortness of breath.   ALPRAZolam (XANAX) 0.5 MG tablet Take 0.5 mg by mouth at bedtime as needed for anxiety or sleep.   amoxicillin-clavulanate (AUGMENTIN) 875-125 MG tablet Take 1 tablet by mouth 2 (two) times daily.   aspirin EC 81 MG tablet Take 1 tablet (81 mg total) by mouth daily. Swallow whole.   cetirizine (ZYRTEC) 10 MG tablet Take 1 tablet (10 mg total) by mouth daily as needed for  allergies.   Cholecalciferol (VITAMIN D-3 PO) Take 1 tablet by mouth daily.   cinacalcet (SENSIPAR) 30 MG tablet Take 30 mg by mouth daily.   docusate sodium (COLACE) 100 MG capsule Take 100 mg by mouth daily.   famotidine (PEPCID) 20 MG tablet Take 1 tablet (20 mg total) by mouth daily. As needed for breakthrough reflux.   ferric citrate (AURYXIA) 1 GM 210 MG(Fe) tablet Take 420 mg by mouth 2 (two) times daily with a meal. With Breakfast & with supper   glipiZIDE (GLUCOTROL) 5 MG tablet Take 1 tablet (5 mg total) by mouth daily before breakfast.   ibuprofen (ADVIL) 800 MG tablet Take 800 mg by mouth every 8 (eight) hours as needed.   insulin glargine, 1 Unit Dial, (TOUJEO SOLOSTAR) 300 UNIT/ML Solostar Pen Inject 30 Units into the skin at bedtime.   ipratropium (ATROVENT) 0.06 % nasal spray Place 2 sprays into both nostrils daily as needed for rhinitis.   irbesartan (AVAPRO) 75 MG tablet Take 75 mg by mouth See admin instructions. Take 75 mg at night on non-dialysis days (Sunday, Tuesday, Thursday, Saturday)   labetalol (NORMODYNE) 100 MG tablet Take 100 mg by mouth 2 (two) times daily.   linaclotide (LINZESS) 290 MCG CAPS capsule Take 290 mcg by mouth daily before breakfast.   montelukast (SINGULAIR) 10  MG tablet Take 1 tablet (10 mg total) by mouth at bedtime.   multivitamin (RENA-VIT) TABS tablet Take 1 tablet by mouth daily.   NIFEdipine (ADALAT CC) 30 MG 24 hr tablet Take 30 mg by mouth See admin instructions. Take 30 mg at night on non-dialysis days (Sunday, Tuesday, Thursday, Saturday)   ondansetron (ZOFRAN-ODT) 4 MG disintegrating tablet Take 1 tablet (4 mg total) by mouth every 8 (eight) hours as needed for nausea or vomiting.   oxyCODONE (OXY IR/ROXICODONE) 5 MG immediate release tablet Take 1 tablet (5 mg total) by mouth every 6 (six) hours as needed for moderate pain.   OXYGEN Inhale 2 L into the lungs continuous.   pantoprazole (PROTONIX) 40 MG tablet Take 1 tablet (40 mg total) by  mouth daily. 30 minutes before breakfast   rOPINIRole (REQUIP XL) 2 MG 24 hr tablet Take 2 mg by mouth at bedtime.    rosuvastatin (CRESTOR) 20 MG tablet Take 20 mg by mouth daily.   sevelamer carbonate (RENVELA) 800 MG tablet Take 1,600-2,400 mg by mouth See admin instructions. Take 1600 mg at breakfast, 2400 mg at lunch and 1600 mg at dinner   traMADol (ULTRAM) 50 MG tablet Take 50 mg by mouth 2 (two) times daily.   No current facility-administered medications for this visit. (Other)   REVIEW OF SYSTEMS: ROS   Positive for: Endocrine, Eyes, Respiratory Negative for: Constitutional, Gastrointestinal, Neurological, Skin, Genitourinary, Musculoskeletal, HENT, Cardiovascular, Psychiatric, Allergic/Imm, Heme/Lymph Last edited by Etheleen Mayhew, COT on 03/03/2024  8:13 AM.       ALLERGIES Allergies  Allergen Reactions   Norvasc [Amlodipine Besylate] Rash and Other (See Comments)    Dizziness    Reglan [Metoclopramide] Other (See Comments)    Hallucinations    PAST MEDICAL HISTORY Past Medical History:  Diagnosis Date   Anemia    Ankle fracture    Arthritis    Asthma    Blood transfusion without reported diagnosis    Breast cancer (HCC)    Cancer (HCC)    COVID    Diabetes mellitus without complication (HCC)    Dyspnea    End stage renal disease on dialysis (HCC)    M/W/F [facility] in [place]   GERD (gastroesophageal reflux disease)    Hypertension    OSA (obstructive sleep apnea)    uses CPAP sometimes   Pneumonia    PONV (postoperative nausea and vomiting)    Wears glasses    Past Surgical History:  Procedure Laterality Date   A/V FISTULAGRAM Left 08/13/2023   Procedure: A/V Fistulagram;  Surgeon: Nada Libman, MD;  Location: [facility] INVASIVE CV LAB;  Service: Cardiovascular;  Laterality: Left;   ABDOMINAL HYSTERECTOMY     APPLICATION OF WOUND VAC Left 05/26/2023   Procedure: APPLICATION OF WOUND VAC;  Surgeon: Victorino Sparrow, MD;  Location: [facility] OR;  Service:  Vascular;  Laterality: Left;   AV FISTULA PLACEMENT  11/2014   at [facility] hospital   AV FISTULA REPAIR N/A 04/2021   BALLOON DILATION N/A 07/10/2016   Procedure: BALLOON DILATION;  Surgeon: West Bali, MD;  Location: [facility] ENDO SUITE;  Service: Endoscopy;  Laterality: N/A;  Pyloric dilation   BREAST LUMPECTOMY     CATARACT EXTRACTION W/PHACO Right 05/24/2023   Procedure: CATARACT EXTRACTION PHACO AND INTRAOCULAR LENS PLACEMENT (IOC);  Surgeon: [name], MD;  Location: [facility] ORS;  Service: Ophthalmology;  Laterality: Right;  CDE: 5.00   CESAREAN SECTION     CHOLECYSTECTOMY  COLONOSCOPY  02/2023   COLONOSCOPY WITH PROPOFOL N/A 09/27/2016   Dr. Jena Gauss: Internal hemorrhoids repeat colonoscopy in 10 years   COLONOSCOPY WITH PROPOFOL N/A 02/28/2023   Procedure: COLONOSCOPY WITH PROPOFOL;  Surgeon: Corbin Ade, MD;  Location: [facility];  Service: Endoscopy;  Laterality: N/A;  11:45 am, asa 3 dialysis pt   DIALYSIS/PERMA CATHETER INSERTION N/A 11/15/2023   Procedure: DIALYSIS/PERMA CATHETER INSERTION;  Surgeon: Nada Libman, MD;  Location: [facility] INVASIVE CV LAB;  Service: Cardiovascular;  Laterality: N/A;   DILATION AND CURETTAGE OF UTERUS     ESOPHAGOGASTRODUODENOSCOPY N/A 07/10/2016   Dr.Fields- normal esophagus, gastric stenosis was found at the pylorus, gastritis on bx, normal examined duodenun   ESOPHAGOGASTRODUODENOSCOPY (EGD) WITH PROPOFOL N/A 11/21/2022   Procedure: ESOPHAGOGASTRODUODENOSCOPY (EGD) WITH PROPOFOL;  Surgeon: Dolores Frame, MD;  [facility]: [facility];  Service: Gastroenterology;  Laterality: N/A;   EXTERNAL FIXATION REMOVAL Right 10/29/2018   Procedure: REMOVAL RIGHT ANKLE BIOMET ZIMMER EXTERNAL FIXATOR, SHORT LEG CAST APPLICATION;  Surgeon: Eldred Manges, MD;  Location: [facility] OR;  Service: Orthopedics;  Laterality: Right;   INSERTION OF DIALYSIS CATHETER  05/26/2023   Procedure: INSERTION OF Left internal jugular DIALYSIS CATHETER;  Surgeon: Victorino Sparrow, MD;  Location: [facility] OR;  Service: Vascular;;   IR FLUORO GUIDE CV LINE LEFT  08/28/2023   IR FLUORO GUIDE CV LINE RIGHT  09/24/2023   IR PATIENT EVAL TECH 0-60 MINS  09/24/2023   IR US GUIDE VASC ACCESS LEFT  08/28/2023   IR US GUIDE VASC ACCESS RIGHT  09/24/2023   MASTECTOMY     left sided   ORIF ANKLE FRACTURE Right 10/06/2018   Procedure: OPEN REDUCTION INTERNAL FIXATION (ORIF) RIGHT ANKLE TRIMALLEOLAR;  Surgeon: Eldred Manges, MD;  Location: [facility] OR;  Service: Orthopedics;  Laterality: Right;   PERIPHERAL VASCULAR BALLOON ANGIOPLASTY  08/13/2023   Procedure: PERIPHERAL VASCULAR BALLOON ANGIOPLASTY;  Surgeon: Nada Libman, MD;  Location: [facility];  Service: Cardiovascular;;   REFRACTIVE SURGERY Right 01/30/2024   REVISON OF ARTERIOVENOUS FISTULA Left 05/26/2023   Procedure: REVISON OF ARTERIOVENOUS FISTULA LEFT ARM HEMATOMA WASH OUT;  Surgeon: Victorino Sparrow, MD;  Location: [facility] OR;  Service: Vascular;  Laterality: Left;   RIGHT/LEFT HEART CATH AND CORONARY ANGIOGRAPHY N/A 11/27/2022   Procedure: RIGHT/LEFT HEART CATH AND CORONARY ANGIOGRAPHY;  Surgeon: Swaziland, Peter M, MD;  Location: [facility] INVASIVE CV LAB;  Service: Cardiovascular;  Laterality: N/A;   TEE WITHOUT CARDIOVERSION N/A 09/18/2023   Procedure: TRANSESOPHAGEAL ECHOCARDIOGRAM (TEE);  Surgeon: Jonelle Sidle, MD;  Location: [facility]S;  Service: Cardiovascular;  Laterality: N/A;   FAMILY HISTORY Family History  Problem Relation Age of Onset   Diabetes Mellitus II Mother    Hypertension Mother    Heart block Mother    Hypertension Sister    Hypertension Sister    Colon cancer Neg Hx    SOCIAL HISTORY Social History   Tobacco Use   Smoking status: Never   Smokeless tobacco: Never  Vaping Use   Vaping status: Never Used  Substance Use Topics   Alcohol use: No    Alcohol/week: 0.0 standard drinks of alcohol   Drug use: No       OPHTHALMIC EXAM:  Base Eye Exam     Visual Acuity (Snellen -  Linear)       Right Left   Dist [place] 20/60 -1 20/40 -2   Dist ph [place] NI NI  Tonometry (Tonopen, 8:20 AM)       Right Left   Pressure 24 23  Squeezing         Pupils       Pupils Dark Light Shape React APD   Right PERRL 3 2 Round Brisk None   Left PERRL 3 2 Round Brisk None         Visual Fields       Left Right    Full Full         Extraocular Movement       Right Left    Full, Ortho Full, Ortho         Neuro/Psych     Oriented x3: Yes   Mood/Affect: Normal         Dilation     Both eyes: 2.5% Phenylephrine @ 8:20 AM           Slit Lamp and Fundus Exam     Slit Lamp Exam       Right Left   Lids/Lashes Dermatochalasis - upper lid Dermatochalasis - upper lid   Conjunctiva/Sclera mild melanosis mild melanosis   Cornea mild arcus, well healed cataract wound mild arcus, 1+ Punctate epithelial erosions   Anterior Chamber deep and clear deep and clear   Iris Round and reactive, No NVI Round and dilated, No NVI   Lens PC IOL in good position -- open PC 2-3+ Nuclear sclerosis, 2-3+ Cortical cataract   Anterior Vitreous mild syneresis, old white VH settled inferiorly, scattered vitreous condensations, Posterior vitreous detachment mild syneresis, scattered fibrosis         Fundus Exam       Right Left   Disc Pink and Sharp, no NVD mild Pallor, Sharp rim, no NVD   C/D Ratio 0.2 0.3   Macula Blunted foveal reflex, scattered MA, focal pigment clump temporal fovea, trace cystic changes Flat, Blunted foveal reflex, central edema and exudates, scattered MA   Vessels attenuated, mild tortuosity attenuated, Tortuous   Periphery Attached, scattered PRP 360 with good fill in changes, scattered MA/DBH, peripheral schisis IT with old, white VH overlying Attached, 360 peripheral PRP with good fill in changes, scattered patches of pre-retinal heme turning white           IMAGING AND PROCEDURES  Imaging and Procedures for 03/03/2024  OCT, Retina  - OU - Both Eyes       Right Eye Quality was good. Central Foveal Thickness: 247. Progression has improved. Findings include normal foveal contour, no SRF, intraretinal hyper-reflective material, intraretinal fluid (Focal IRHM and trace cystic changes temporal fovea and macula; IT schisis - stable, +vitreous opacities -- slightly improved, partial PVD).   Left Eye Quality was good. Central Foveal Thickness: 438. Progression has been stable. Findings include no SRF, abnormal foveal contour, intraretinal hyper-reflective material, intraretinal fluid, vitreomacular adhesion (Persistent IRF/IRHM/edema temporal fovea and macula -- slightly increased; IN schisis not imaged today).   Notes *Images captured and stored on drive  Diagnosis / Impression:  +DME OU + peripheral retinoschisis OU OD: Focal IRHM and trace cystic changes temporal fovea and macula; IT schisis - stable, +vitreous opacities -- slightly improved, partial PVD  OS: Persistent IRF/IRHM/edema temporal fovea and macula -- slightly increased; IN schisis not imaged today  Clinical management:  See below  Abbreviations: NFP - Normal foveal profile. CME - cystoid macular edema. PED - pigment epithelial detachment. IRF - intraretinal fluid. SRF - subretinal fluid. EZ - ellipsoid zone. ERM - epiretinal membrane. ORA -  outer retinal atrophy. ORT - outer retinal tubulation. SRHM - subretinal hyper-reflective material. IRHM - intraretinal hyper-reflective material            ASSESSMENT/PLAN:   ICD-10-CM   1. Proliferative diabetic retinopathy of both eyes with macular edema associated with type 2 diabetes mellitus (HCC)  E11.3513 OCT, Retina - OU - Both Eyes    2. 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 Essential hypertension  I10     5. Hypertensive retinopathy of both eyes  H35.033     6. Pseudophakia  Z96.1       1,2. Proliferative diabetic retinopathy, both eyes  - A1C 6.9  (11.27.24)  - delayed f/u from 4 weeks to 9 weeks (08.20.24-10.22.24) due to being in the hospital  - s/p IVA OS #1 (05.14.24) - s/p IVA OD #1 (05.16.24) - s/p IVA OU # 2 (06.25.24), #3 (07.23.24), #4 (08.20.24), #5 (10.22.24), #6 (11.19.24), #7 (12.17.24) - IVA resistance ======================================= - s/p IVE OU #1 (01.21.25), #2 (02.18.25)  - s/p PRP OS (05.21.24)  - s/p PRP OD (08.01.24)  - previously followed with Dr. Dorette Grate at [facility], but had been lost to f/u since 10.28.2020 - h/o PRP OU; had never received anti-VEGF injxns prior to here, per report - exam shows old white VH + some red subhyaloid heme OD; OS with focal clusters of preretinal heme turning white -- nasal and temporal periphery - FA (05.14.24) shows OD: Clusters of perivascular leakage inferior and temporal midzone -- ?early NV; OS: Clusters of NVE temporal midzone -- pt would benefit from fill in PRP OU (OS first--done 05.21.24) - FA (02.18.25) shows OD: Clusters of perivascular leakage inferior and temporal midzone -- ?early NV -- improved, no NV; OS: Clusters of NVE temporal midzone -- improved - OCT shows OD: Focal IRHM and trace cystic changes temporal fovea and macula; IT schisis - stable, +vitreous opacities -- slightly improved, partial PVD; OS: Persistent IRF/IRHM/edema temporal fovea and macula -- slightly improved; shallow IN schisis caught on widefield today at 4 weeks - recommend IVE OU #3 today 03.18.25 w/ f/u in 4 wks - pt wishes to proceed with injections - RBA of procedure discussed, questions answered - Eylea informed consent obtained and signed 01.21.25 (OU) - see procedure note - Eylea approved for 2025 - f/u 4 weeks, DFE, OCT, possible injection(s)   3. Retinoschisis OU  - peripheral retinoschisis confirmed by OCT  - OD -- inferotemporal periphery  - OS -- nasal periphery  - stable, monitor  4,5. Hypertensive retinopathy OU - discussed importance of tight BP control - monitor  6.  Pseudophakia OD  - s/p CE/IOL OD (Dr. June Leap, 06.07.24)  - IOL in good position  - s/p YAG cap OD (02.12.25)  - monitor  7. Mixed Cataract OS - The symptoms of cataract, surgical options, and treatments and risks were discussed with patient. - discussed diagnosis and progression - under the expert management of Dr. June Leap - clear from a retina standpoint to proceed with cataract surgery when pt and surgeon are ready  Ophthalmic Meds Ordered this visit:  No orders of the defined types were placed in this encounter.    Return in about 4 weeks (around 03/31/2024) for f/u PDR OU , DFE, OCT, Possible, IVE, OU.  There are no Patient Instructions on file for this visit.  Explained the diagnoses, plan, and follow up with the patient and they expressed understanding.  Patient expressed understanding of the importance of proper follow up care.   This document serves as a record of services personally performed by Karie Chimera, MD, PhD. It was created on their behalf by Charlette Caffey, COT an ophthalmic technician. The creation of this record is the provider's dictation and/or activities during the visit.    Electronically signed by:  Charlette Caffey, COT  03/03/24 8:47 AM  Karie Chimera, M.D., Ph.D. Diseases & Surgery of the Retina and Vitreous [facility]    Abbreviations: M myopia (nearsighted); A astigmatism; H hyperopia (farsighted); P presbyopia; Mrx spectacle prescription;  CTL contact lenses; OD right eye; OS left eye; OU both eyes  XT exotropia; ET esotropia; PEK punctate epithelial keratitis; PEE punctate epithelial erosions; DES dry eye syndrome; MGD meibomian gland dysfunction; ATs artificial tears; PFAT's preservative free artificial tears; NSC nuclear sclerotic cataract; PSC posterior subcapsular cataract; ERM epi-retinal membrane; PVD posterior vitreous detachment; RD retinal detachment; DM diabetes mellitus; DR diabetic retinopathy; NPDR  non-proliferative diabetic retinopathy; PDR proliferative diabetic retinopathy; CSME clinically significant macular edema; DME diabetic macular edema; dbh dot blot hemorrhages; CWS cotton wool spot; POAG primary open angle glaucoma; C/D cup-to-disc ratio; HVF humphrey visual field; GVF goldmann visual field; OCT optical coherence tomography; IOP intraocular pressure; BRVO Branch retinal vein occlusion; CRVO central retinal vein occlusion; CRAO central retinal artery occlusion; BRAO branch retinal artery occlusion; RT retinal tear; SB scleral buckle; PPV pars plana vitrectomy; VH Vitreous hemorrhage; PRP panretinal laser photocoagulation; IVK intravitreal kenalog; VMT vitreomacular traction; MH Macular hole;  NVD neovascularization of the disc; NVE neovascularization elsewhere; AREDS age related eye disease study; ARMD age related macular degeneration; POAG primary open angle glaucoma; EBMD epithelial/anterior basement membrane dystrophy; ACIOL anterior chamber intraocular lens; IOL intraocular lens; PCIOL posterior chamber intraocular lens; Phaco/IOL phacoemulsification with intraocular lens placement; PRK photorefractive keratectomy; LASIK laser assisted in situ keratomileusis; HTN hypertension; DM diabetes mellitus; COPD chronic obstructive pulmonary disease

## 2024-02-19 ENCOUNTER — Ambulatory Visit (HOSPITAL_COMMUNITY)
Admission: RE | Admit: 2024-02-19 | Discharge: 2024-02-19 | Disposition: A | Discharge status: Home / Self Care | Source: Ambulatory Visit | Attending: Nurse Practitioner | Admitting: Nurse Practitioner

## 2024-02-19 DIAGNOSIS — N186 End stage renal disease: Secondary | ICD-10-CM | POA: Diagnosis not present

## 2024-02-19 DIAGNOSIS — J4481 Bronchiolitis obliterans and bronchiolitis obliterans syndrome: Secondary | ICD-10-CM | POA: Diagnosis not present

## 2024-02-19 DIAGNOSIS — N2581 Secondary hyperparathyroidism of renal origin: Secondary | ICD-10-CM | POA: Diagnosis not present

## 2024-02-19 DIAGNOSIS — J9611 Chronic respiratory failure with hypoxia: Secondary | ICD-10-CM | POA: Insufficient documentation

## 2024-02-19 DIAGNOSIS — D631 Anemia in chronic kidney disease: Secondary | ICD-10-CM | POA: Diagnosis not present

## 2024-02-19 DIAGNOSIS — J219 Acute bronchiolitis, unspecified: Secondary | ICD-10-CM | POA: Diagnosis not present

## 2024-02-19 DIAGNOSIS — Z992 Dependence on renal dialysis: Secondary | ICD-10-CM | POA: Diagnosis not present

## 2024-02-19 DIAGNOSIS — J849 Interstitial pulmonary disease, unspecified: Secondary | ICD-10-CM | POA: Diagnosis not present

## 2024-02-19 DIAGNOSIS — N250 Renal osteodystrophy: Secondary | ICD-10-CM | POA: Diagnosis not present

## 2024-02-19 DIAGNOSIS — R2231 Localized swelling, mass and lump, right upper limb: Secondary | ICD-10-CM | POA: Insufficient documentation

## 2024-02-19 DIAGNOSIS — M7989 Other specified soft tissue disorders: Secondary | ICD-10-CM | POA: Diagnosis not present

## 2024-02-19 DIAGNOSIS — D509 Iron deficiency anemia, unspecified: Secondary | ICD-10-CM | POA: Diagnosis not present

## 2024-02-20 ENCOUNTER — Ambulatory Visit (HOSPITAL_COMMUNITY)
Admission: RE | Admit: 2024-02-20 | Discharge: 2024-02-20 | Disposition: A | Discharge status: Home / Self Care | Payer: Medicare Other | Source: Ambulatory Visit | Attending: Adult Health | Admitting: Adult Health

## 2024-02-20 DIAGNOSIS — R918 Other nonspecific abnormal finding of lung field: Secondary | ICD-10-CM | POA: Diagnosis not present

## 2024-02-20 DIAGNOSIS — J849 Interstitial pulmonary disease, unspecified: Secondary | ICD-10-CM | POA: Diagnosis not present

## 2024-02-20 DIAGNOSIS — R2231 Localized swelling, mass and lump, right upper limb: Secondary | ICD-10-CM | POA: Diagnosis not present

## 2024-02-20 DIAGNOSIS — J4481 Bronchiolitis obliterans and bronchiolitis obliterans syndrome: Secondary | ICD-10-CM | POA: Diagnosis not present

## 2024-02-20 DIAGNOSIS — J219 Acute bronchiolitis, unspecified: Secondary | ICD-10-CM | POA: Diagnosis not present

## 2024-02-20 DIAGNOSIS — J9611 Chronic respiratory failure with hypoxia: Secondary | ICD-10-CM

## 2024-02-21 DIAGNOSIS — N2581 Secondary hyperparathyroidism of renal origin: Secondary | ICD-10-CM | POA: Diagnosis not present

## 2024-02-21 DIAGNOSIS — D509 Iron deficiency anemia, unspecified: Secondary | ICD-10-CM | POA: Diagnosis not present

## 2024-02-21 DIAGNOSIS — N250 Renal osteodystrophy: Secondary | ICD-10-CM | POA: Diagnosis not present

## 2024-02-21 DIAGNOSIS — D631 Anemia in chronic kidney disease: Secondary | ICD-10-CM | POA: Diagnosis not present

## 2024-02-21 DIAGNOSIS — Z992 Dependence on renal dialysis: Secondary | ICD-10-CM | POA: Diagnosis not present

## 2024-02-21 DIAGNOSIS — N186 End stage renal disease: Secondary | ICD-10-CM | POA: Diagnosis not present

## 2024-02-24 DIAGNOSIS — N250 Renal osteodystrophy: Secondary | ICD-10-CM | POA: Diagnosis not present

## 2024-02-24 DIAGNOSIS — D631 Anemia in chronic kidney disease: Secondary | ICD-10-CM | POA: Diagnosis not present

## 2024-02-24 DIAGNOSIS — N2581 Secondary hyperparathyroidism of renal origin: Secondary | ICD-10-CM | POA: Diagnosis not present

## 2024-02-24 DIAGNOSIS — Z992 Dependence on renal dialysis: Secondary | ICD-10-CM | POA: Diagnosis not present

## 2024-02-24 DIAGNOSIS — D509 Iron deficiency anemia, unspecified: Secondary | ICD-10-CM | POA: Diagnosis not present

## 2024-02-24 DIAGNOSIS — N186 End stage renal disease: Secondary | ICD-10-CM | POA: Diagnosis not present

## 2024-02-26 DIAGNOSIS — N186 End stage renal disease: Secondary | ICD-10-CM | POA: Diagnosis not present

## 2024-02-26 DIAGNOSIS — Z992 Dependence on renal dialysis: Secondary | ICD-10-CM | POA: Diagnosis not present

## 2024-02-26 DIAGNOSIS — N250 Renal osteodystrophy: Secondary | ICD-10-CM | POA: Diagnosis not present

## 2024-02-26 DIAGNOSIS — D509 Iron deficiency anemia, unspecified: Secondary | ICD-10-CM | POA: Diagnosis not present

## 2024-02-26 DIAGNOSIS — N2581 Secondary hyperparathyroidism of renal origin: Secondary | ICD-10-CM | POA: Diagnosis not present

## 2024-02-26 DIAGNOSIS — D631 Anemia in chronic kidney disease: Secondary | ICD-10-CM | POA: Diagnosis not present

## 2024-02-28 DIAGNOSIS — Z992 Dependence on renal dialysis: Secondary | ICD-10-CM | POA: Diagnosis not present

## 2024-02-28 DIAGNOSIS — N2581 Secondary hyperparathyroidism of renal origin: Secondary | ICD-10-CM | POA: Diagnosis not present

## 2024-02-28 DIAGNOSIS — N250 Renal osteodystrophy: Secondary | ICD-10-CM | POA: Diagnosis not present

## 2024-02-28 DIAGNOSIS — D509 Iron deficiency anemia, unspecified: Secondary | ICD-10-CM | POA: Diagnosis not present

## 2024-02-28 DIAGNOSIS — D631 Anemia in chronic kidney disease: Secondary | ICD-10-CM | POA: Diagnosis not present

## 2024-02-28 DIAGNOSIS — N186 End stage renal disease: Secondary | ICD-10-CM | POA: Diagnosis not present

## 2024-03-02 DIAGNOSIS — D631 Anemia in chronic kidney disease: Secondary | ICD-10-CM | POA: Diagnosis not present

## 2024-03-02 DIAGNOSIS — N250 Renal osteodystrophy: Secondary | ICD-10-CM | POA: Diagnosis not present

## 2024-03-02 DIAGNOSIS — Z992 Dependence on renal dialysis: Secondary | ICD-10-CM | POA: Diagnosis not present

## 2024-03-02 DIAGNOSIS — N186 End stage renal disease: Secondary | ICD-10-CM | POA: Diagnosis not present

## 2024-03-02 DIAGNOSIS — N2581 Secondary hyperparathyroidism of renal origin: Secondary | ICD-10-CM | POA: Diagnosis not present

## 2024-03-02 DIAGNOSIS — D509 Iron deficiency anemia, unspecified: Secondary | ICD-10-CM | POA: Diagnosis not present

## 2024-03-03 ENCOUNTER — Ambulatory Visit (INDEPENDENT_AMBULATORY_CARE_PROVIDER_SITE_OTHER): Payer: Medicare Other | Admitting: Ophthalmology

## 2024-03-03 ENCOUNTER — Encounter (INDEPENDENT_AMBULATORY_CARE_PROVIDER_SITE_OTHER): Payer: Self-pay | Admitting: Ophthalmology

## 2024-03-03 DIAGNOSIS — Z794 Long term (current) use of insulin: Secondary | ICD-10-CM | POA: Diagnosis not present

## 2024-03-03 DIAGNOSIS — I10 Essential (primary) hypertension: Secondary | ICD-10-CM

## 2024-03-03 DIAGNOSIS — H35033 Hypertensive retinopathy, bilateral: Secondary | ICD-10-CM

## 2024-03-03 DIAGNOSIS — H33103 Unspecified retinoschisis, bilateral: Secondary | ICD-10-CM

## 2024-03-03 DIAGNOSIS — Z961 Presence of intraocular lens: Secondary | ICD-10-CM

## 2024-03-03 DIAGNOSIS — E113513 Type 2 diabetes mellitus with proliferative diabetic retinopathy with macular edema, bilateral: Secondary | ICD-10-CM | POA: Diagnosis not present

## 2024-03-03 MED ORDER — AFLIBERCEPT 2MG/0.05ML IZ SOLN FOR KALEIDOSCOPE
2.0000 mg | INTRAVITREAL | Status: AC | PRN
Start: 2024-03-03 — End: 2024-03-03
  Administered 2024-03-03: 2 mg via INTRAVITREAL

## 2024-03-03 MED ORDER — AFLIBERCEPT 2MG/0.05ML IZ SOLN FOR KALEIDOSCOPE
2.0000 mg | INTRAVITREAL | Status: AC | PRN
  Administered 2024-03-03: 2 mg via INTRAVITREAL

## 2024-03-04 DIAGNOSIS — N2581 Secondary hyperparathyroidism of renal origin: Secondary | ICD-10-CM | POA: Diagnosis not present

## 2024-03-04 DIAGNOSIS — D631 Anemia in chronic kidney disease: Secondary | ICD-10-CM | POA: Diagnosis not present

## 2024-03-04 DIAGNOSIS — D509 Iron deficiency anemia, unspecified: Secondary | ICD-10-CM | POA: Diagnosis not present

## 2024-03-04 DIAGNOSIS — N186 End stage renal disease: Secondary | ICD-10-CM | POA: Diagnosis not present

## 2024-03-04 DIAGNOSIS — Z992 Dependence on renal dialysis: Secondary | ICD-10-CM | POA: Diagnosis not present

## 2024-03-04 DIAGNOSIS — N250 Renal osteodystrophy: Secondary | ICD-10-CM | POA: Diagnosis not present

## 2024-03-06 DIAGNOSIS — N250 Renal osteodystrophy: Secondary | ICD-10-CM | POA: Diagnosis not present

## 2024-03-06 DIAGNOSIS — Z992 Dependence on renal dialysis: Secondary | ICD-10-CM | POA: Diagnosis not present

## 2024-03-06 DIAGNOSIS — D509 Iron deficiency anemia, unspecified: Secondary | ICD-10-CM | POA: Diagnosis not present

## 2024-03-06 DIAGNOSIS — D631 Anemia in chronic kidney disease: Secondary | ICD-10-CM | POA: Diagnosis not present

## 2024-03-06 DIAGNOSIS — N2581 Secondary hyperparathyroidism of renal origin: Secondary | ICD-10-CM | POA: Diagnosis not present

## 2024-03-06 DIAGNOSIS — N186 End stage renal disease: Secondary | ICD-10-CM | POA: Diagnosis not present

## 2024-03-09 DIAGNOSIS — N2581 Secondary hyperparathyroidism of renal origin: Secondary | ICD-10-CM | POA: Diagnosis not present

## 2024-03-09 DIAGNOSIS — N250 Renal osteodystrophy: Secondary | ICD-10-CM | POA: Diagnosis not present

## 2024-03-09 DIAGNOSIS — D631 Anemia in chronic kidney disease: Secondary | ICD-10-CM | POA: Diagnosis not present

## 2024-03-09 DIAGNOSIS — D509 Iron deficiency anemia, unspecified: Secondary | ICD-10-CM | POA: Diagnosis not present

## 2024-03-09 DIAGNOSIS — Z992 Dependence on renal dialysis: Secondary | ICD-10-CM | POA: Diagnosis not present

## 2024-03-09 DIAGNOSIS — N186 End stage renal disease: Secondary | ICD-10-CM | POA: Diagnosis not present

## 2024-03-10 ENCOUNTER — Other Ambulatory Visit (HOSPITAL_COMMUNITY): Payer: Self-pay | Admitting: Neurosurgery

## 2024-03-10 DIAGNOSIS — M47816 Spondylosis without myelopathy or radiculopathy, lumbar region: Secondary | ICD-10-CM | POA: Diagnosis not present

## 2024-03-10 DIAGNOSIS — Z6841 Body Mass Index (BMI) 40.0 and over, adult: Secondary | ICD-10-CM | POA: Diagnosis not present

## 2024-03-11 DIAGNOSIS — D631 Anemia in chronic kidney disease: Secondary | ICD-10-CM | POA: Diagnosis not present

## 2024-03-11 DIAGNOSIS — N2581 Secondary hyperparathyroidism of renal origin: Secondary | ICD-10-CM | POA: Diagnosis not present

## 2024-03-11 DIAGNOSIS — N186 End stage renal disease: Secondary | ICD-10-CM | POA: Diagnosis not present

## 2024-03-11 DIAGNOSIS — D509 Iron deficiency anemia, unspecified: Secondary | ICD-10-CM | POA: Diagnosis not present

## 2024-03-11 DIAGNOSIS — Z992 Dependence on renal dialysis: Secondary | ICD-10-CM | POA: Diagnosis not present

## 2024-03-11 DIAGNOSIS — N250 Renal osteodystrophy: Secondary | ICD-10-CM | POA: Diagnosis not present

## 2024-03-12 DIAGNOSIS — D631 Anemia in chronic kidney disease: Secondary | ICD-10-CM | POA: Diagnosis not present

## 2024-03-12 DIAGNOSIS — N2581 Secondary hyperparathyroidism of renal origin: Secondary | ICD-10-CM | POA: Diagnosis not present

## 2024-03-12 DIAGNOSIS — N186 End stage renal disease: Secondary | ICD-10-CM | POA: Diagnosis not present

## 2024-03-12 DIAGNOSIS — Z992 Dependence on renal dialysis: Secondary | ICD-10-CM | POA: Diagnosis not present

## 2024-03-12 DIAGNOSIS — D509 Iron deficiency anemia, unspecified: Secondary | ICD-10-CM | POA: Diagnosis not present

## 2024-03-12 DIAGNOSIS — N250 Renal osteodystrophy: Secondary | ICD-10-CM | POA: Diagnosis not present

## 2024-03-13 DIAGNOSIS — D509 Iron deficiency anemia, unspecified: Secondary | ICD-10-CM | POA: Diagnosis not present

## 2024-03-13 DIAGNOSIS — N2581 Secondary hyperparathyroidism of renal origin: Secondary | ICD-10-CM | POA: Diagnosis not present

## 2024-03-13 DIAGNOSIS — Z992 Dependence on renal dialysis: Secondary | ICD-10-CM | POA: Diagnosis not present

## 2024-03-13 DIAGNOSIS — N250 Renal osteodystrophy: Secondary | ICD-10-CM | POA: Diagnosis not present

## 2024-03-13 DIAGNOSIS — N186 End stage renal disease: Secondary | ICD-10-CM | POA: Diagnosis not present

## 2024-03-13 DIAGNOSIS — D631 Anemia in chronic kidney disease: Secondary | ICD-10-CM | POA: Diagnosis not present

## 2024-03-15 ENCOUNTER — Ambulatory Visit (HOSPITAL_COMMUNITY)
Admission: RE | Admit: 2024-03-15 | Discharge: 2024-03-15 | Disposition: A | Discharge status: Home / Self Care | Source: Ambulatory Visit | Attending: Neurosurgery | Admitting: Neurosurgery

## 2024-03-15 DIAGNOSIS — M47816 Spondylosis without myelopathy or radiculopathy, lumbar region: Secondary | ICD-10-CM | POA: Insufficient documentation

## 2024-03-16 DIAGNOSIS — Z992 Dependence on renal dialysis: Secondary | ICD-10-CM | POA: Diagnosis not present

## 2024-03-16 DIAGNOSIS — N186 End stage renal disease: Secondary | ICD-10-CM | POA: Diagnosis not present

## 2024-03-16 DIAGNOSIS — N250 Renal osteodystrophy: Secondary | ICD-10-CM | POA: Diagnosis not present

## 2024-03-16 DIAGNOSIS — D631 Anemia in chronic kidney disease: Secondary | ICD-10-CM | POA: Diagnosis not present

## 2024-03-16 DIAGNOSIS — D509 Iron deficiency anemia, unspecified: Secondary | ICD-10-CM | POA: Diagnosis not present

## 2024-03-16 DIAGNOSIS — N2581 Secondary hyperparathyroidism of renal origin: Secondary | ICD-10-CM | POA: Diagnosis not present

## 2024-03-17 ENCOUNTER — Ambulatory Visit (HOSPITAL_COMMUNITY)
Admission: RE | Admit: 2024-03-17 | Discharge: 2024-03-17 | Disposition: A | Discharge status: Home / Self Care | Payer: Medicare Other | Source: Ambulatory Visit | Attending: Adult Health | Admitting: Adult Health

## 2024-03-17 DIAGNOSIS — J4481 Bronchiolitis obliterans and bronchiolitis obliterans syndrome: Secondary | ICD-10-CM | POA: Insufficient documentation

## 2024-03-17 DIAGNOSIS — D631 Anemia in chronic kidney disease: Secondary | ICD-10-CM | POA: Diagnosis not present

## 2024-03-17 DIAGNOSIS — J849 Interstitial pulmonary disease, unspecified: Secondary | ICD-10-CM | POA: Insufficient documentation

## 2024-03-17 DIAGNOSIS — J9611 Chronic respiratory failure with hypoxia: Secondary | ICD-10-CM | POA: Insufficient documentation

## 2024-03-17 DIAGNOSIS — Z992 Dependence on renal dialysis: Secondary | ICD-10-CM | POA: Diagnosis not present

## 2024-03-17 DIAGNOSIS — N2581 Secondary hyperparathyroidism of renal origin: Secondary | ICD-10-CM | POA: Diagnosis not present

## 2024-03-17 DIAGNOSIS — N186 End stage renal disease: Secondary | ICD-10-CM | POA: Diagnosis not present

## 2024-03-17 LAB — PULMONARY FUNCTION TEST
DL/VA % pred: 61 %
DL/VA: 2.66 ml/min/mmHg/L
DLCO unc % pred: 25 %
DLCO unc: 4.99 ml/min/mmHg
FEF 25-75 Post: 0.92 L/s
FEF 25-75 Pre: 1.12 L/s
FEF2575-%Change-Post: -18 %
FEF2575-%Pred-Post: 36 %
FEF2575-%Pred-Pre: 43 %
FEV1-%Change-Post: -7 %
FEV1-%Pred-Post: 35 %
FEV1-%Pred-Pre: 38 %
FEV1-Post: 0.94 L
FEV1-Pre: 1.02 L
FEV1FVC-%Change-Post: 3 %
FEV1FVC-%Pred-Pre: 108 %
FEV6-%Change-Post: -6 %
FEV6-%Pred-Post: 32 %
FEV6-%Pred-Pre: 34 %
FEV6-Post: 1.05 L
FEV6-Pre: 1.13 L
FEV6FVC-%Pred-Post: 103 %
FEV6FVC-%Pred-Pre: 103 %
FVC-%Change-Post: -11 %
FVC-%Pred-Post: 31 %
FVC-%Pred-Pre: 35 %
FVC-Post: 1.05 L
FVC-Pre: 1.18 L
Post FEV1/FVC ratio: 89 %
Post FEV6/FVC ratio: 100 %
Pre FEV1/FVC ratio: 86 %
Pre FEV6/FVC Ratio: 100 %
RV % pred: 67 %
RV: 1.23 L
TLC % pred: 52 %
TLC: 2.57 L

## 2024-03-17 MED ORDER — ALBUTEROL SULFATE (2.5 MG/3ML) 0.083% IN NEBU
2.5000 mg | INHALATION_SOLUTION | Freq: Once | RESPIRATORY_TRACT | Status: AC
  Administered 2024-03-17: 2.5 mg via RESPIRATORY_TRACT

## 2024-03-18 DIAGNOSIS — N2581 Secondary hyperparathyroidism of renal origin: Secondary | ICD-10-CM | POA: Diagnosis not present

## 2024-03-18 DIAGNOSIS — Z992 Dependence on renal dialysis: Secondary | ICD-10-CM | POA: Diagnosis not present

## 2024-03-18 DIAGNOSIS — D631 Anemia in chronic kidney disease: Secondary | ICD-10-CM | POA: Diagnosis not present

## 2024-03-18 DIAGNOSIS — N186 End stage renal disease: Secondary | ICD-10-CM | POA: Diagnosis not present

## 2024-03-19 DIAGNOSIS — Z6841 Body Mass Index (BMI) 40.0 and over, adult: Secondary | ICD-10-CM | POA: Diagnosis not present

## 2024-03-19 DIAGNOSIS — M48062 Spinal stenosis, lumbar region with neurogenic claudication: Secondary | ICD-10-CM | POA: Diagnosis not present

## 2024-03-20 DIAGNOSIS — D631 Anemia in chronic kidney disease: Secondary | ICD-10-CM | POA: Diagnosis not present

## 2024-03-20 DIAGNOSIS — N186 End stage renal disease: Secondary | ICD-10-CM | POA: Diagnosis not present

## 2024-03-20 DIAGNOSIS — Z992 Dependence on renal dialysis: Secondary | ICD-10-CM | POA: Diagnosis not present

## 2024-03-20 DIAGNOSIS — N2581 Secondary hyperparathyroidism of renal origin: Secondary | ICD-10-CM | POA: Diagnosis not present

## 2024-03-23 DIAGNOSIS — Z992 Dependence on renal dialysis: Secondary | ICD-10-CM | POA: Diagnosis not present

## 2024-03-23 DIAGNOSIS — N2581 Secondary hyperparathyroidism of renal origin: Secondary | ICD-10-CM | POA: Diagnosis not present

## 2024-03-23 DIAGNOSIS — N186 End stage renal disease: Secondary | ICD-10-CM | POA: Diagnosis not present

## 2024-03-23 DIAGNOSIS — D631 Anemia in chronic kidney disease: Secondary | ICD-10-CM | POA: Diagnosis not present

## 2024-03-25 ENCOUNTER — Encounter: Payer: Self-pay | Admitting: *Deleted

## 2024-03-25 ENCOUNTER — Telehealth: Payer: Self-pay | Admitting: *Deleted

## 2024-03-25 DIAGNOSIS — D631 Anemia in chronic kidney disease: Secondary | ICD-10-CM | POA: Diagnosis not present

## 2024-03-25 DIAGNOSIS — Z992 Dependence on renal dialysis: Secondary | ICD-10-CM | POA: Diagnosis not present

## 2024-03-25 DIAGNOSIS — N186 End stage renal disease: Secondary | ICD-10-CM | POA: Diagnosis not present

## 2024-03-25 DIAGNOSIS — N2581 Secondary hyperparathyroidism of renal origin: Secondary | ICD-10-CM | POA: Diagnosis not present

## 2024-03-25 NOTE — Telephone Encounter (Signed)
 Copied from CRM [id number]. Topic: General - Call Back - No Documentation >> Mar 20, 2024  8:26 AM Nila Nephew wrote: Reason for CRM: Patient states is returning a call to [place]. No documentation present for reasoning of the call. Please reach back out to patient.  Patient is requesting a more detailed voice message be left, as she is on dialysis. Patient dropped call.  ATC patient x1. Left detailed message per DPR.  Rubye Oaks NP sent her a mychart message regarding the results of her CT scan.  I do not see a phone note where anyone called her.  Will await return call.

## 2024-03-26 DIAGNOSIS — M48062 Spinal stenosis, lumbar region with neurogenic claudication: Secondary | ICD-10-CM | POA: Diagnosis not present

## 2024-03-27 DIAGNOSIS — D631 Anemia in chronic kidney disease: Secondary | ICD-10-CM | POA: Diagnosis not present

## 2024-03-27 DIAGNOSIS — N186 End stage renal disease: Secondary | ICD-10-CM | POA: Diagnosis not present

## 2024-03-27 DIAGNOSIS — N2581 Secondary hyperparathyroidism of renal origin: Secondary | ICD-10-CM | POA: Diagnosis not present

## 2024-03-27 DIAGNOSIS — Z992 Dependence on renal dialysis: Secondary | ICD-10-CM | POA: Diagnosis not present

## 2024-03-30 DIAGNOSIS — N2581 Secondary hyperparathyroidism of renal origin: Secondary | ICD-10-CM | POA: Diagnosis not present

## 2024-03-30 DIAGNOSIS — D631 Anemia in chronic kidney disease: Secondary | ICD-10-CM | POA: Diagnosis not present

## 2024-03-30 DIAGNOSIS — N186 End stage renal disease: Secondary | ICD-10-CM | POA: Diagnosis not present

## 2024-03-30 DIAGNOSIS — Z992 Dependence on renal dialysis: Secondary | ICD-10-CM | POA: Diagnosis not present

## 2024-03-31 ENCOUNTER — Encounter (INDEPENDENT_AMBULATORY_CARE_PROVIDER_SITE_OTHER): Payer: Self-pay

## 2024-03-31 ENCOUNTER — Encounter (INDEPENDENT_AMBULATORY_CARE_PROVIDER_SITE_OTHER): Admitting: Ophthalmology

## 2024-03-31 DIAGNOSIS — H35033 Hypertensive retinopathy, bilateral: Secondary | ICD-10-CM

## 2024-03-31 DIAGNOSIS — Z961 Presence of intraocular lens: Secondary | ICD-10-CM

## 2024-03-31 DIAGNOSIS — E113513 Type 2 diabetes mellitus with proliferative diabetic retinopathy with macular edema, bilateral: Secondary | ICD-10-CM

## 2024-03-31 DIAGNOSIS — Z794 Long term (current) use of insulin: Secondary | ICD-10-CM

## 2024-03-31 DIAGNOSIS — I10 Essential (primary) hypertension: Secondary | ICD-10-CM

## 2024-03-31 DIAGNOSIS — H33103 Unspecified retinoschisis, bilateral: Secondary | ICD-10-CM

## 2024-03-31 NOTE — Progress Notes (Signed)
 [facility] - Clinic Note  04/07/2024   CHIEF COMPLAINT Patient presents for Retina Follow Up  HISTORY OF PRESENT ILLNESS: Patricia [name] is a 53 y.o. female who presents to the clinic today for:  HPI     Retina Follow Up   Patient presents with  Diabetic Retinopathy.  In both eyes.  Severity is moderate.  Duration of 4 weeks.  Since onset it is stable.  I, the attending physician,  performed the HPI with the patient and updated documentation appropriately.        Comments   4 week Retina eval. Patient states vision is the same. Patient sees a black dot in od. Blood sugar 110      Last edited by Ronelle Coffee, MD on 04/07/2024  9:37 PM.    Patient states she is seeing a black dot in her vision.   Referring physician: Tarri Farm, MD [address] [place],  [place] [address]  HISTORICAL INFORMATION:  Selected notes from the MEDICAL RECORD NUMBER Referred by Dr. Adora Hoover for macular edema LEE:  Ocular Hx- PMH-   CURRENT MEDICATIONS: Current Outpatient Medications (Ophthalmic Drugs)  Medication Sig   Polyvinyl Alcohol-Povidone (REFRESH OP) Place 1 drop into both eyes daily as needed (dry eyes).   No current facility-administered medications for this visit. (Ophthalmic Drugs)   Current Outpatient Medications (Other)  Medication Sig   acetaminophen  (TYLENOL ) 325 MG tablet Take 650 mg by mouth every 6 (six) hours as needed for mild pain or fever.   albuterol  (VENTOLIN  HFA) 108 (90 Base) MCG/ACT inhaler Inhale 2 puffs into the lungs every 6 (six) hours as needed for wheezing or shortness of breath.   ALPRAZolam  (XANAX ) 0.5 MG tablet Take 0.5 mg by mouth at bedtime as needed for anxiety or sleep.   amoxicillin -clavulanate (AUGMENTIN ) 875-125 MG tablet Take 1 tablet by mouth 2 (two) times daily.   aspirin  EC 81 MG tablet Take 1 tablet (81 mg total) by mouth daily. Swallow whole.   cetirizine  (ZYRTEC ) 10 MG tablet Take 1 tablet (10 mg total) by mouth  daily as needed for allergies.   Cholecalciferol  (VITAMIN D -3 PO) Take 1 tablet by mouth daily.   docusate sodium  (COLACE) 100 MG capsule Take 100 mg by mouth daily.   famotidine  (PEPCID ) 20 MG tablet Take 1 tablet (20 mg total) by mouth daily. As needed for breakthrough reflux.   glipiZIDE  (GLUCOTROL ) 5 MG tablet Take 1 tablet (5 mg total) by mouth daily before breakfast.   ibuprofen (ADVIL) 800 MG tablet Take 800 mg by mouth every 8 (eight) hours as needed.   insulin  glargine, 1 Unit Dial , (TOUJEO  SOLOSTAR) 300 UNIT/ML Solostar Pen Inject 30 Units into the skin at bedtime.   ipratropium (ATROVENT ) 0.06 % nasal spray Place 2 sprays into both nostrils daily as needed for rhinitis.   irbesartan  (AVAPRO ) 75 MG tablet Take 75 mg by mouth See admin instructions. Take 75 mg at night on non-dialysis days (Sunday, Tuesday, Thursday, Saturday)   labetalol  (NORMODYNE ) 100 MG tablet Take 100 mg by mouth 2 (two) times daily.   linaclotide  (LINZESS ) 290 MCG CAPS capsule Take 290 mcg by mouth daily before breakfast.   montelukast  (SINGULAIR ) 10 MG tablet Take 1 tablet (10 mg total) by mouth at bedtime.   multivitamin (RENA-VIT) TABS tablet Take 1 tablet by mouth daily.   NIFEdipine  (ADALAT  CC) 30 MG 24 hr tablet Take 30 mg by mouth See admin instructions. Take 30 mg at night  on non-dialysis days (Sunday, Tuesday, Thursday, Saturday)   ondansetron  (ZOFRAN -ODT) 4 MG disintegrating tablet Take 1 tablet (4 mg total) by mouth every 8 (eight) hours as needed for nausea or vomiting.   oxyCODONE  (OXY IR/ROXICODONE ) 5 MG immediate release tablet Take 1 tablet (5 mg total) by mouth every 6 (six) hours as needed for moderate pain.   OXYGEN  Inhale 2 L into the lungs continuous.   pantoprazole  (PROTONIX ) 40 MG tablet Take 1 tablet (40 mg total) by mouth daily. 30 minutes before breakfast   rOPINIRole  (REQUIP  XL) 2 MG 24 hr tablet Take 2 mg by mouth at bedtime.    rosuvastatin  (CRESTOR ) 20 MG tablet Take 20 mg by mouth  daily.   traMADol (ULTRAM) 50 MG tablet Take 50 mg by mouth 2 (two) times daily.   cinacalcet  (SENSIPAR ) 30 MG tablet Take 30 mg by mouth daily.   ferric citrate  (AURYXIA ) 1 GM 210 MG(Fe) tablet Take 420 mg by mouth 2 (two) times daily with a meal. With Breakfast & with supper   sevelamer  carbonate (RENVELA ) 800 MG tablet Take 1,600-2,400 mg by mouth See admin instructions. Take 1600 mg at breakfast, 2400 mg at lunch and 1600 mg at dinner   No current facility-administered medications for this visit. (Other)   REVIEW OF SYSTEMS: ROS   Positive for: Endocrine, Eyes, Respiratory Negative for: Constitutional, Gastrointestinal, Neurological, Skin, Genitourinary, Musculoskeletal, HENT, Cardiovascular, Psychiatric, Allergic/Imm, Heme/Lymph Last edited by Leonia Raman, COT on 04/07/2024 12:48 PM.     ALLERGIES Allergies  Allergen Reactions   Norvasc  [Amlodipine  Besylate] Rash and Other (See Comments)    Dizziness    Reglan  [Metoclopramide ] Other (See Comments)    Hallucinations    PAST MEDICAL HISTORY Past Medical History:  Diagnosis Date   Anemia    Ankle fracture    Arthritis    Asthma    Blood transfusion without reported diagnosis    Breast cancer (HCC)    Cancer (HCC)    COVID    Diabetes mellitus without complication (HCC)    Dyspnea    End stage renal disease on dialysis (HCC)    M/W/F [facility] in [place]   GERD (gastroesophageal reflux disease)    Hypertension    OSA (obstructive sleep apnea)    uses CPAP sometimes   Pneumonia    PONV (postoperative nausea and vomiting)    Wears glasses    Past Surgical History:  Procedure Laterality Date   A/V FISTULAGRAM Left 08/13/2023   Procedure: A/V Fistulagram;  Surgeon: Margherita Shell, MD;  Location: [facility] IN[facility]ASIVE [facility];  Service: Cardiovascular;  Laterality: Left;   ABDOMINAL HYSTERECTOMY     APPLICATION OF WOUND VAC Left 05/26/2023   Procedure: APPLICATION OF WOUND VAC;  Surgeon: Kayla Part, MD;  Location: [facility]  OR;  Service: Vascular;  Laterality: Left;   AV FISTULA PLACEMENT  11/2014   at [facility] hospital   AV FISTULA REPAIR N/A 04/2021   BALLOON DILATION N/A 07/10/2016   Procedure: BALLOON DILATION;  Surgeon: Alyce Jubilee, MD;  Location: [facility];  Service: Endoscopy;  Laterality: N/A;  Pyloric dilation   BREAST LUMPECTOMY     CATARACT EXTRACTION W/PHACO Right 05/24/2023   Procedure: CATARACT EXTRACTION PHACO AND INTRAOCULAR LENS PLACEMENT (IOC);  Surgeon: Tarri Farm, MD;  Location: [facility] ORS;  Service: Ophthalmology;  Laterality: Right;  CDE: 5.00   CESAREAN SECTION     CHOLECYSTECTOMY     COLONOSCOPY  02/2023   COLONOSCOPY WITH PROPOFOL  N/A  09/27/2016   Dr. Riley Cheadle: Internal hemorrhoids repeat colonoscopy in 10 years   COLONOSCOPY WITH PROPOFOL  N/A 02/28/2023   Procedure: COLONOSCOPY WITH PROPOFOL ;  Surgeon: Suzette Espy, MD;  [facility]: [facility];  Service: Endoscopy;  Laterality: N/A;  11:45 am, asa 3 dialysis pt   DIALYSIS/PERMA CATHETER INSERTION N/A 11/15/2023   Procedure: DIALYSIS/PERMA CATHETER INSERTION;  Surgeon: Margherita Shell, MD;  Location: [facility] INVASIVE CV LAB;  Service: Cardiovascular;  Laterality: N/A;   DILATION AND CURETTAGE OF UTERUS     ESOPHAGOGASTRODUODENOSCOPY N/A 07/10/2016   Dr.Fields- normal esophagus, gastric stenosis was found at the pylorus, gastritis on bx, normal examined duodenun   ESOPHAGOGASTRODUODENOSCOPY (EGD) WITH PROPOFOL  N/A 11/21/2022   Procedure: ESOPHAGOGASTRODUODENOSCOPY (EGD) WITH PROPOFOL ;  Surgeon: Urban Garden, MD;  [facility];  Service: Gastroenterology;  Laterality: N/A;   EXTERNAL FIXATION REMOVAL Right 10/29/2018   Procedure: REMOVAL RIGHT ANKLE BIOMET ZIMMER EXTERNAL FIXATOR, SHORT LEG CAST APPLICATION;  Surgeon: Adah Acron, MD;  Location: [facility] OR;  Service: Orthopedics;  Laterality: Right;   INSERTION OF DIALYSIS CATHETER  05/26/2023   Procedure: INSERTION OF Left internal jugular DIALYSIS CATHETER;   Surgeon: Kayla Part, MD;  [facility] OR;  Service: Vascular;;   IR FLUORO GUIDE CV LINE LEFT  08/28/2023   IR FLUORO GUIDE CV LINE RIGHT  09/24/2023   IR PATIENT EVAL TECH 0-60 MINS  09/24/2023   IR US  GUIDE VASC ACCESS LEFT  08/28/2023   IR US  GUIDE VASC ACCESS RIGHT  09/24/2023   MASTECTOMY     left sided   ORIF ANKLE FRACTURE Right 10/06/2018   Procedure: OPEN REDUCTION INTERNAL FIXATION (ORIF) RIGHT ANKLE TRIMALLEOLAR;  Surgeon: Adah Acron, MD;  Location: [facility] OR;  Service: Orthopedics;  Laterality: Right;   PERIPHERAL VASCULAR BALLOON ANGIOPLASTY  08/13/2023   Procedure: PERIPHERAL VASCULAR BALLOON ANGIOPLASTY;  Surgeon: Margherita Shell, MD;  [facility]: [facility];  Service: Cardiovascular;;   REFRACTIVE SURGERY Right 01/30/2024   REVISON OF ARTERIOVENOUS FISTULA Left 05/26/2023   Procedure: REVISON OF ARTERIOVENOUS FISTULA LEFT ARM HEMATOMA WASH OUT;  Surgeon: Kayla Part, MD;  Location: [facility] OR;  Service: Vascular;  Laterality: Left;   RIGHT/LEFT HEART CATH AND CORONARY ANGIOGRAPHY N/A 11/27/2022   Procedure: RIGHT/LEFT HEART CATH AND CORONARY ANGIOGRAPHY;  Surgeon: Swazil[place], Peter M, MD;  Location: [facility] INVASIVE CV LAB;  Service: Cardiovascular;  Laterality: N/A;   TEE WITHOUT CARDIOVERSION N/A 09/18/2023   Procedure: TRANSESOPHAGEAL ECHOCARDIOGRAM (TEE);  Surgeon: Gerard Knight, MD;  Location: [facility];  Service: Cardiovascular;  Laterality: N/A;   FAMILY HISTORY Family History  Problem Relation Age of Onset   Diabetes Mellitus II Mother    Hypertension Mother    Heart block Mother    Hypertension Sister    Hypertension Sister    Colon cancer Neg Hx    SOCIAL HISTORY Social History   Tobacco Use   Smoking status: Never   Smokeless tobacco: Never  Vaping Use   Vaping status: Never Used  Substance Use Topics   Alcohol use: No    Alcohol/week: 0.0 standard drinks of alcohol   Drug use: No       OPHTHALMIC EXAM:  Base Eye Exam     Visual Acuity  (Snellen - Linear)       Right Left   Dist [place] 20/70 20/40 -3   Dist ph [place] 20/50 +1 20/30         Tonometry (Tonopen, 12:57 PM)  Right Left   Pressure 22 20         Pupils       Dark Light Shape React APD   Right 3 2 Round Brisk None   Left 3 2 Round Brisk None         Visual Fields (Counting fingers)       Left Right    Full Full         Extraocular Movement       Right Left    Full, Ortho Full, Ortho         Neuro/Psych     Oriented x3: Yes   Mood/Affect: Normal         Dilation     Both eyes: 1.0% Mydriacyl , 2.5% Phenylephrine  @ 12:57 PM           Slit Lamp and Fundus Exam     Slit Lamp Exam       Right Left   Lids/Lashes Dermatochalasis - upper lid Dermatochalasis - upper lid   Conjunctiva/Sclera mild melanosis mild melanosis   Cornea mild arcus, well healed cataract wound mild arcus, 1+ Punctate epithelial erosions   Anterior Chamber deep and clear deep and clear   Iris Round and reactive, No NVI Round and dilated, No NVI   Lens PC IOL in good position -- open PC 2-3+ Nuclear sclerosis, 2-3+ Cortical cataract   Anterior Vitreous mild syneresis, old white VH settled inferiorly, scattered vitreous condensations, Posterior vitreous detachment mild syneresis, scattered fibrosis         Fundus Exam       Right Left   Disc Pink and Sharp, no NVD mild Pallor, Sharp rim, no NVD   C/D Ratio 0.2 0.3   Macula Blunted foveal reflex, scattered MA, focal pigment clump temporal fovea, trace cystic changes Flat, Blunted foveal reflex, central edema and exudates- slightly improved, scattered MA   Vessels attenuated, mild tortuosity attenuated, Tortuous   Periphery Attached, scattered PRP 360 with good fill in changes, scattered MA/DBH, peripheral schisis IT with old, white VH overlying Attached, 360 peripheral PRP with good fill in changes, scattered patches of pre-retinal heme turning white           IMAGING AND PROCEDURES  Imaging  and Procedures for 04/07/2024  OCT, Retina - OU - Both Eyes       Right Eye Quality was good. Central Foveal Thickness: 241. Progression has improved. Findings include normal foveal contour, no SRF, intraretinal hyper-reflective material, intraretinal fluid (Focal IRHM and trace cystic changes temporal fovea and macula; IT schisis - stable, +vitreous opacities -- slightly improved, partial PVD).   Left Eye Quality was good. Central Foveal Thickness: 404. Progression has improved. Findings include no SRF, abnormal foveal contour, intraretinal hyper-reflective material, intraretinal fluid, vitreomacular adhesion (Persistent IRF/IRHM/edema temporal fovea and macula -- slightly improved; shallow IN schisis caught on widefield).   Notes *Images captured and stored on drive  Diagnosis / Impression:  +DME OU + peripheral retinoschisis OU OD: Focal IRHM and trace cystic changes temporal fovea and macula; IT schisis - stable, +vitreous opacities -- slightly improved, partial PVD  OS: Persistent IRF/IRHM/edema temporal fovea and macula -- slightly improved; shallow IN schisis caught on widefield  Clinical management:  See below  Abbreviations: NFP - Normal foveal profile. CME - cystoid macular edema. PED - pigment epithelial detachment. IRF - intraretinal fluid. SRF - subretinal fluid. EZ - ellipsoid zone. ERM - epiretinal membrane. ORA - outer retinal atrophy. ORT - outer retinal  tubulation. SRHM - subretinal hyper-reflective material. IRHM - intraretinal hyper-reflective material      Intravitreal Injection, Pharmacologic Agent - OD - Right Eye       Time Out 04/07/2024. 1:24 PM. Confirmed correct patient, procedure, site, and patient consented.   Anesthesia Topical anesthesia was used. Anesthetic medications included Lidocaine  2%, Proparacaine 0.5%.   Procedure Preparation included 5% betadine  to ocular surface, eyelid speculum. A (32g) needle was used.   Injection: 2 mg aflibercept  2  MG/0.05ML   Route: Intravitreal, Site: Right Eye   NDC: Q956576, Lot: [id number], Expiration date: 05/16/2025, Waste: 0 mL   Post-op Post injection exam found visual acuity of at least counting fingers. The patient tolerated the procedure well. There were no complications. The patient received written and verbal post procedure care education. Post injection medications were not given.      Intravitreal Injection, Pharmacologic Agent - OS - Left Eye       Time Out 04/07/2024. 1:24 PM. Confirmed correct patient, procedure, site, and patient consented.   Anesthesia Topical anesthesia was used. Anesthetic medications included Lidocaine  2%, Proparacaine 0.5%.   Procedure Preparation included 5% betadine  to ocular surface, eyelid speculum. A (32g) needle was used.   Injection: 2 mg aflibercept  2 MG/0.05ML   Route: Intravitreal, Site: Left Eye   NDC: Q956576, Lot: [id number], Expiration date: 04/14/2025, Waste: 0 mL   Post-op Post injection exam found visual acuity of at least counting fingers. The patient tolerated the procedure well. There were no complications. The patient received written and verbal post procedure care education.           ASSESSMENT/PLAN:   ICD-10-CM   1. Proliferative diabetic retinopathy of both eyes with macular edema associated with type 2 diabetes mellitus (HCC)  E11.3513 OCT, Retina - OU - Both Eyes    Intravitreal Injection, Pharmacologic Agent - OD - Right Eye    Intravitreal Injection, Pharmacologic Agent - OS - Left Eye    aflibercept  (EYLEA ) SOLN 2 mg    aflibercept  (EYLEA ) SOLN 2 mg    2. 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 Essential hypertension  I10     5. Hypertensive retinopathy of both eyes  H35.033     6. Pseudophakia  Z96.1     7. Combined forms of age-related cataract of left eye  H25.812      1,2. Proliferative diabetic retinopathy, both eyes  - A1C 6.9 (11.27.24) -  delayed f/u from 4 weeks to 9 weeks (08.20.24-10.22.24) due to being in the hospital  - s/p IVA OS #1 (05.14.24) - s/p IVA OD #1 (05.16.24) - s/p IVA OU # 2 (06.25.24), #3 (07.23.24), #4 (08.20.24), #5 (10.22.24), #6 (11.19.24), #7 (12.17.24) ======================== - s/p IVE OU #1 (01.21.25), #2 (02.18.25)  - s/p PRP OS (05.21.24)  - s/p PRP OD (08.01.24) - previously followed with Dr. Inis Mani at [facility], but had been lost to f/u since 10.28.2020 - h/o PRP OU; had never received anti-VEGF injxns prior to here, per report - exam shows old white VH + some red subhyaloid heme OD; OS with focal clusters of preretinal heme turning white -- nasal and temporal periphery - FA (05.14.24) shows OD: Clusters of perivascular leakage inferior and temporal midzone -- ?early NV; OS: Clusters of NVE temporal midzone -- pt would benefit from fill in PRP OU (OS first--done 05.21.24) - FA (02.18.25) shows OD: Clusters of perivascular leakage inferior and temporal  midzone -- ?early NV -- improved, no NV; OS: Clusters of NVE temporal midzone -- improved - OCT shows OD: Focal IRHM and trace cystic changes temporal fovea and macula; IT schisis - stable, +vitreous opacities -- slightly improved, partial PVD; OS: Persistent IRF/IRHM/edema temporal fovea and macula -- slightly improved; IN schisis not imaged today at 4 weeks - recommend IVE OU #3 today 04.22.25 w/ f/u in 4 wks - pt wishes to proceed with injections - RBA of procedure discussed, questions answered - Eylea  informed consent obtained and signed 01.21.25 (OU) - see procedure note - Eylea  approved for 2025 - f/u 4 weeks, DFE, OCT, possible injection(s)   3. Retinoschisis OU  - peripheral retinoschisis confirmed by OCT  - OD -- inferotemporal periphery  - OS -- nasal periphery  - stable, monitor  4,5. Hypertensive retinopathy OU - discussed importance of tight BP control - monitor  6. Pseudophakia OD  - s/p CE/IOL OD (Dr. Adora Hoover, 06.07.24)  - IOL in  good position  - s/p YAG cap OD (02.12.25)  - monitor  7. Mixed Cataract OS - The symptoms of cataract, surgical options, and treatments and risks were discussed with patient. - discussed diagnosis and progression - under the expert management of Dr. Adora Hoover - clear from a retina standpoint to proceed with cataract surgery when pt and surgeon are ready  Ophthalmic Meds Ordered this visit:  Meds ordered this encounter  Medications   aflibercept  (EYLEA ) SOLN 2 mg   aflibercept  (EYLEA ) SOLN 2 mg     Return in about 4 weeks (around 05/05/2024) for f/u PDR OU, DFE, OCT, Possible, IVE, OU.  There are no Patient Instructions on file for this visit.  Explained the diagnoses, plan, and follow up with the patient and they expressed understanding.  Patient expressed understanding of the importance of proper follow up care.   This document serves as a record of services personally performed by Jeanice Millard, MD, PhD. It was created on their behalf by Olene Berne, COT an ophthalmic technician. The creation of this record is the provider's dictation and/or activities during the visit.    Electronically signed by:  Olene Berne, COT  04/07/24 9:44 PM  Jeanice Millard, M.D., Ph.D. Diseases & Surgery of the Retina and Vitreous [facility]  I have reviewed the above documentation for accuracy and completeness, and I agree with the above. Jeanice Millard, M.D., Ph.D. 04/07/24 9:44 PM    Abbreviations: M myopia (nearsighted); A astigmatism; H hyperopia (farsighted); P presbyopia; Mrx spectacle prescription;  CTL contact lenses; OD right eye; OS left eye; OU both eyes  XT exotropia; ET esotropia; PEK punctate epithelial keratitis; PEE punctate epithelial erosions; DES dry eye syndrome; MGD meibomian gland dysfunction; ATs artificial tears; PFAT's preservative free artificial tears; NSC nuclear sclerotic cataract; PSC posterior subcapsular cataract; ERM epi-retinal  membrane; PVD posterior vitreous detachment; RD retinal detachment; DM diabetes mellitus; DR diabetic retinopathy; NPDR non-proliferative diabetic retinopathy; PDR proliferative diabetic retinopathy; CSME clinically significant macular edema; DME diabetic macular edema; dbh dot blot hemorrhages; CWS cotton wool spot; POAG primary open angle glaucoma; C/D cup-to-disc ratio; HVF humphrey visual field; GVF goldmann visual field; OCT optical coherence tomography; IOP intraocular pressure; BRVO Branch retinal vein occlusion; CRVO central retinal vein occlusion; CRAO central retinal artery occlusion; BRAO branch retinal artery occlusion; RT retinal tear; SB scleral buckle; PPV pars plana vitrectomy; VH Vitreous hemorrhage; PRP panretinal laser photocoagulation; IVK intravitreal kenalog; VMT vitreomacular traction; MH Macular  hole;  NVD neovascularization of the disc; NVE neovascularization elsewhere; AREDS age related eye disease study; ARMD age related macular degeneration; POAG primary open angle glaucoma; EBMD epithelial/anterior basement membrane dystrophy; ACIOL anterior chamber intraocular lens; IOL intraocular lens; PCIOL posterior chamber intraocular lens; Phaco/IOL phacoemulsification with intraocular lens placement; PRK photorefractive keratectomy; LASIK laser assisted in situ keratomileusis; HTN hypertension; DM diabetes mellitus; COPD chronic obstructive pulmonary disease

## 2024-04-01 DIAGNOSIS — D631 Anemia in chronic kidney disease: Secondary | ICD-10-CM | POA: Diagnosis not present

## 2024-04-01 DIAGNOSIS — N2581 Secondary hyperparathyroidism of renal origin: Secondary | ICD-10-CM | POA: Diagnosis not present

## 2024-04-01 DIAGNOSIS — Z992 Dependence on renal dialysis: Secondary | ICD-10-CM | POA: Diagnosis not present

## 2024-04-01 DIAGNOSIS — N186 End stage renal disease: Secondary | ICD-10-CM | POA: Diagnosis not present

## 2024-04-03 DIAGNOSIS — N186 End stage renal disease: Secondary | ICD-10-CM | POA: Diagnosis not present

## 2024-04-03 DIAGNOSIS — Z992 Dependence on renal dialysis: Secondary | ICD-10-CM | POA: Diagnosis not present

## 2024-04-03 DIAGNOSIS — N2581 Secondary hyperparathyroidism of renal origin: Secondary | ICD-10-CM | POA: Diagnosis not present

## 2024-04-03 DIAGNOSIS — D631 Anemia in chronic kidney disease: Secondary | ICD-10-CM | POA: Diagnosis not present

## 2024-04-06 DIAGNOSIS — N186 End stage renal disease: Secondary | ICD-10-CM | POA: Diagnosis not present

## 2024-04-06 DIAGNOSIS — Z992 Dependence on renal dialysis: Secondary | ICD-10-CM | POA: Diagnosis not present

## 2024-04-06 DIAGNOSIS — D631 Anemia in chronic kidney disease: Secondary | ICD-10-CM | POA: Diagnosis not present

## 2024-04-06 DIAGNOSIS — N2581 Secondary hyperparathyroidism of renal origin: Secondary | ICD-10-CM | POA: Diagnosis not present

## 2024-04-07 ENCOUNTER — Ambulatory Visit (INDEPENDENT_AMBULATORY_CARE_PROVIDER_SITE_OTHER): Admitting: Ophthalmology

## 2024-04-07 ENCOUNTER — Encounter (INDEPENDENT_AMBULATORY_CARE_PROVIDER_SITE_OTHER): Payer: Self-pay | Admitting: Ophthalmology

## 2024-04-07 DIAGNOSIS — H25812 Combined forms of age-related cataract, left eye: Secondary | ICD-10-CM

## 2024-04-07 DIAGNOSIS — H906 Mixed conductive and sensorineural hearing loss, bilateral: Secondary | ICD-10-CM | POA: Diagnosis not present

## 2024-04-07 DIAGNOSIS — Z961 Presence of intraocular lens: Secondary | ICD-10-CM | POA: Diagnosis not present

## 2024-04-07 DIAGNOSIS — E113513 Type 2 diabetes mellitus with proliferative diabetic retinopathy with macular edema, bilateral: Secondary | ICD-10-CM | POA: Diagnosis not present

## 2024-04-07 DIAGNOSIS — Z794 Long term (current) use of insulin: Secondary | ICD-10-CM

## 2024-04-07 DIAGNOSIS — H900 Conductive hearing loss, bilateral: Secondary | ICD-10-CM | POA: Diagnosis not present

## 2024-04-07 DIAGNOSIS — H33103 Unspecified retinoschisis, bilateral: Secondary | ICD-10-CM | POA: Diagnosis not present

## 2024-04-07 DIAGNOSIS — I10 Essential (primary) hypertension: Secondary | ICD-10-CM

## 2024-04-07 DIAGNOSIS — H35033 Hypertensive retinopathy, bilateral: Secondary | ICD-10-CM | POA: Diagnosis not present

## 2024-04-07 MED ORDER — AFLIBERCEPT 2MG/0.05ML IZ SOLN FOR KALEIDOSCOPE
2.0000 mg | INTRAVITREAL | Status: AC | PRN
  Administered 2024-04-07: 2 mg via INTRAVITREAL

## 2024-04-08 DIAGNOSIS — Z992 Dependence on renal dialysis: Secondary | ICD-10-CM | POA: Diagnosis not present

## 2024-04-08 DIAGNOSIS — N2581 Secondary hyperparathyroidism of renal origin: Secondary | ICD-10-CM | POA: Diagnosis not present

## 2024-04-08 DIAGNOSIS — D631 Anemia in chronic kidney disease: Secondary | ICD-10-CM | POA: Diagnosis not present

## 2024-04-08 DIAGNOSIS — N186 End stage renal disease: Secondary | ICD-10-CM | POA: Diagnosis not present

## 2024-04-10 DIAGNOSIS — N2581 Secondary hyperparathyroidism of renal origin: Secondary | ICD-10-CM | POA: Diagnosis not present

## 2024-04-10 DIAGNOSIS — N186 End stage renal disease: Secondary | ICD-10-CM | POA: Diagnosis not present

## 2024-04-10 DIAGNOSIS — Z992 Dependence on renal dialysis: Secondary | ICD-10-CM | POA: Diagnosis not present

## 2024-04-10 DIAGNOSIS — D631 Anemia in chronic kidney disease: Secondary | ICD-10-CM | POA: Diagnosis not present

## 2024-04-11 DIAGNOSIS — D631 Anemia in chronic kidney disease: Secondary | ICD-10-CM | POA: Diagnosis not present

## 2024-04-11 DIAGNOSIS — N2581 Secondary hyperparathyroidism of renal origin: Secondary | ICD-10-CM | POA: Diagnosis not present

## 2024-04-11 DIAGNOSIS — Z992 Dependence on renal dialysis: Secondary | ICD-10-CM | POA: Diagnosis not present

## 2024-04-11 DIAGNOSIS — N186 End stage renal disease: Secondary | ICD-10-CM | POA: Diagnosis not present

## 2024-04-13 DIAGNOSIS — D631 Anemia in chronic kidney disease: Secondary | ICD-10-CM | POA: Diagnosis not present

## 2024-04-13 DIAGNOSIS — N2581 Secondary hyperparathyroidism of renal origin: Secondary | ICD-10-CM | POA: Diagnosis not present

## 2024-04-13 DIAGNOSIS — Z992 Dependence on renal dialysis: Secondary | ICD-10-CM | POA: Diagnosis not present

## 2024-04-13 DIAGNOSIS — N186 End stage renal disease: Secondary | ICD-10-CM | POA: Diagnosis not present

## 2024-04-15 DIAGNOSIS — Z992 Dependence on renal dialysis: Secondary | ICD-10-CM | POA: Diagnosis not present

## 2024-04-15 DIAGNOSIS — N186 End stage renal disease: Secondary | ICD-10-CM | POA: Diagnosis not present

## 2024-04-15 DIAGNOSIS — N2581 Secondary hyperparathyroidism of renal origin: Secondary | ICD-10-CM | POA: Diagnosis not present

## 2024-04-15 DIAGNOSIS — D631 Anemia in chronic kidney disease: Secondary | ICD-10-CM | POA: Diagnosis not present

## 2024-04-16 ENCOUNTER — Telehealth (HOSPITAL_BASED_OUTPATIENT_CLINIC_OR_DEPARTMENT_OTHER): Payer: Self-pay | Admitting: *Deleted

## 2024-04-16 DIAGNOSIS — N186 End stage renal disease: Secondary | ICD-10-CM | POA: Diagnosis not present

## 2024-04-16 DIAGNOSIS — D509 Iron deficiency anemia, unspecified: Secondary | ICD-10-CM | POA: Diagnosis not present

## 2024-04-16 DIAGNOSIS — N2581 Secondary hyperparathyroidism of renal origin: Secondary | ICD-10-CM | POA: Diagnosis not present

## 2024-04-16 DIAGNOSIS — M48062 Spinal stenosis, lumbar region with neurogenic claudication: Secondary | ICD-10-CM | POA: Diagnosis not present

## 2024-04-16 DIAGNOSIS — D631 Anemia in chronic kidney disease: Secondary | ICD-10-CM | POA: Diagnosis not present

## 2024-04-16 DIAGNOSIS — Z992 Dependence on renal dialysis: Secondary | ICD-10-CM | POA: Diagnosis not present

## 2024-04-16 NOTE — Telephone Encounter (Signed)
   Pre-operative Risk Assessment    Patient Name: Patricia [name]  DOB: [birth year] MRN: [id number]   Date of last office visit: 05/03/2022 Date of next office visit: None  Request for Surgical Clearance    Procedure:  L3-4 Sublaminar Decompression  Date of Surgery:  Clearance TBD                                 Surgeon:  Dr. Audie Bleacher   Surgeon's Group or Practice Name:  [facility] Phone number:  [phone] Fax number:  [phone]   Type of Clearance Requested:   - Medical  - Pharmacy:  Hold Aspirin  Not indicated   Type of Anesthesia:  General    Additional requests/questions:    Signed, Lauris Port   04/16/2024, 4:42 PM

## 2024-04-17 DIAGNOSIS — D631 Anemia in chronic kidney disease: Secondary | ICD-10-CM | POA: Diagnosis not present

## 2024-04-17 DIAGNOSIS — Z992 Dependence on renal dialysis: Secondary | ICD-10-CM | POA: Diagnosis not present

## 2024-04-17 DIAGNOSIS — N2581 Secondary hyperparathyroidism of renal origin: Secondary | ICD-10-CM | POA: Diagnosis not present

## 2024-04-17 DIAGNOSIS — D509 Iron deficiency anemia, unspecified: Secondary | ICD-10-CM | POA: Diagnosis not present

## 2024-04-17 DIAGNOSIS — N186 End stage renal disease: Secondary | ICD-10-CM | POA: Diagnosis not present

## 2024-04-17 NOTE — Telephone Encounter (Signed)
   Name: Patricia [name]  DOB: [birth year]  MRN: [id number]  Primary Cardiologist: Ola Berger, MD  Chart reviewed as part of pre-operative protocol coverage. Because of Patricia [name]'s past medical history and time since last visit, she will require a follow-up in-office visit in order to better assess preoperative cardiovascular risk.  Pre-op covering staff: - Please schedule appointment and call patient to inform them. If patient already had an upcoming appointment within acceptable timeframe, please add "pre-op clearance" to the appointment notes so provider is aware. - Please contact requesting surgeon's office via preferred method (i.e, phone, fax) to inform them of need for appointment prior to surgery.  Aspirin  hold is not indicated   Ava Boatman, NP  04/17/2024, 8:20 AM

## 2024-04-17 NOTE — Telephone Encounter (Signed)
 Pt has been scheduled in office appt 05/05/24 with Katlyn West, NP for preop clearance.

## 2024-04-20 DIAGNOSIS — Z992 Dependence on renal dialysis: Secondary | ICD-10-CM | POA: Diagnosis not present

## 2024-04-20 DIAGNOSIS — D631 Anemia in chronic kidney disease: Secondary | ICD-10-CM | POA: Diagnosis not present

## 2024-04-20 DIAGNOSIS — D509 Iron deficiency anemia, unspecified: Secondary | ICD-10-CM | POA: Diagnosis not present

## 2024-04-20 DIAGNOSIS — N186 End stage renal disease: Secondary | ICD-10-CM | POA: Diagnosis not present

## 2024-04-20 DIAGNOSIS — N2581 Secondary hyperparathyroidism of renal origin: Secondary | ICD-10-CM | POA: Diagnosis not present

## 2024-04-22 DIAGNOSIS — N2581 Secondary hyperparathyroidism of renal origin: Secondary | ICD-10-CM | POA: Diagnosis not present

## 2024-04-22 DIAGNOSIS — D509 Iron deficiency anemia, unspecified: Secondary | ICD-10-CM | POA: Diagnosis not present

## 2024-04-22 DIAGNOSIS — N186 End stage renal disease: Secondary | ICD-10-CM | POA: Diagnosis not present

## 2024-04-22 DIAGNOSIS — Z992 Dependence on renal dialysis: Secondary | ICD-10-CM | POA: Diagnosis not present

## 2024-04-22 DIAGNOSIS — D631 Anemia in chronic kidney disease: Secondary | ICD-10-CM | POA: Diagnosis not present

## 2024-04-23 NOTE — Progress Notes (Shared)
 [facility] - Clinic Note  05/05/2024   CHIEF COMPLAINT Patient presents for No chief complaint on file.  HISTORY OF PRESENT ILLNESS: Patricia [name] is a 53 y.o. female who presents to the clinic today for:   Patient states she is seeing a black dot in her vision.   Referring physician: Tarri Farm, MD [address] [place],  [place] [address]  HISTORICAL INFORMATION:  Selected notes from the MEDICAL RECORD NUMBER Referred by Dr. Adora Hoover for macular edema LEE:  Ocular Hx- PMH-   CURRENT MEDICATIONS: Current Outpatient Medications (Ophthalmic Drugs)  Medication Sig   Polyvinyl Alcohol-Povidone (REFRESH OP) Place 1 drop into both eyes daily as needed (dry eyes).   No current facility-administered medications for this visit. (Ophthalmic Drugs)   Current Outpatient Medications (Other)  Medication Sig   acetaminophen  (TYLENOL ) 325 MG tablet Take 650 mg by mouth every 6 (six) hours as needed for mild pain or fever.   albuterol  (VENTOLIN  HFA) 108 (90 Base) MCG/ACT inhaler Inhale 2 puffs into the lungs every 6 (six) hours as needed for wheezing or shortness of breath.   ALPRAZolam  (XANAX ) 0.5 MG tablet Take 0.5 mg by mouth at bedtime as needed for anxiety or sleep.   amoxicillin -clavulanate (AUGMENTIN ) 875-125 MG tablet Take 1 tablet by mouth 2 (two) times daily.   aspirin  EC 81 MG tablet Take 1 tablet (81 mg total) by mouth daily. Swallow whole.   cetirizine  (ZYRTEC ) 10 MG tablet Take 1 tablet (10 mg total) by mouth daily as needed for allergies.   Cholecalciferol  (VITAMIN D -3 PO) Take 1 tablet by mouth daily.   cinacalcet  (SENSIPAR ) 30 MG tablet Take 30 mg by mouth daily.   docusate sodium  (COLACE) 100 MG capsule Take 100 mg by mouth daily.   famotidine  (PEPCID ) 20 MG tablet Take 1 tablet (20 mg total) by mouth daily. As needed for breakthrough reflux.   ferric citrate  (AURYXIA ) 1 GM 210 MG(Fe) tablet Take 420 mg by mouth 2 (two) times daily with a meal.  With Breakfast & with supper   glipiZIDE  (GLUCOTROL ) 5 MG tablet Take 1 tablet (5 mg total) by mouth daily before breakfast.   ibuprofen (ADVIL) 800 MG tablet Take 800 mg by mouth every 8 (eight) hours as needed.   insulin  glargine, 1 Unit Dial , (TOUJEO  SOLOSTAR) 300 UNIT/ML Solostar Pen Inject 30 Units into the skin at bedtime.   ipratropium (ATROVENT ) 0.06 % nasal spray Place 2 sprays into both nostrils daily as needed for rhinitis.   irbesartan  (AVAPRO ) 75 MG tablet Take 75 mg by mouth See admin instructions. Take 75 mg at night on non-dialysis days (Sunday, Tuesday, Thursday, Saturday)   labetalol  (NORMODYNE ) 100 MG tablet Take 100 mg by mouth 2 (two) times daily.   linaclotide  (LINZESS ) 290 MCG CAPS capsule Take 290 mcg by mouth daily before breakfast.   montelukast  (SINGULAIR ) 10 MG tablet Take 1 tablet (10 mg total) by mouth at bedtime.   multivitamin (RENA-VIT) TABS tablet Take 1 tablet by mouth daily.   NIFEdipine  (ADALAT  CC) 30 MG 24 hr tablet Take 30 mg by mouth See admin instructions. Take 30 mg at night on non-dialysis days (Sunday, Tuesday, Thursday, Saturday)   ondansetron  (ZOFRAN -ODT) 4 MG disintegrating tablet Take 1 tablet (4 mg total) by mouth every 8 (eight) hours as needed for nausea or vomiting.   oxyCODONE  (OXY IR/ROXICODONE ) 5 MG immediate release tablet Take 1 tablet (5 mg total) by mouth every 6 (six) hours as needed  for moderate pain.   OXYGEN  Inhale 2 L into the lungs continuous.   pantoprazole  (PROTONIX ) 40 MG tablet Take 1 tablet (40 mg total) by mouth daily. 30 minutes before breakfast   rOPINIRole  (REQUIP  XL) 2 MG 24 hr tablet Take 2 mg by mouth at bedtime.    rosuvastatin  (CRESTOR ) 20 MG tablet Take 20 mg by mouth daily.   sevelamer  carbonate (RENVELA ) 800 MG tablet Take 1,600-2,400 mg by mouth See admin instructions. Take 1600 mg at breakfast, 2400 mg at lunch and 1600 mg at dinner   traMADol (ULTRAM) 50 MG tablet Take 50 mg by mouth 2 (two) times daily.   No  current facility-administered medications for this visit. (Other)   REVIEW OF SYSTEMS:   ALLERGIES Allergies  Allergen Reactions   Norvasc  [Amlodipine  Besylate] Rash and Other (See Comments)    Dizziness    Reglan  [Metoclopramide ] Other (See Comments)    Hallucinations    PAST MEDICAL HISTORY Past Medical History:  Diagnosis Date   Anemia    Ankle fracture    Arthritis    Asthma    Blood transfusion without reported diagnosis    Breast cancer (HCC)    Cancer (HCC)    COVID    Diabetes mellitus without complication (HCC)    Dyspnea    End stage renal disease on dialysis (HCC)    M/W/F [facility] in    GERD (gastroesophageal reflux disease)    Hypertension    OSA (obstructive sleep apnea)    uses CPAP sometimes   Pneumonia    PONV (postoperative nausea and vomiting)    Wears glasses    Past Surgical History:  Procedure Laterality Date   A/V FISTULAGRAM Left 08/13/2023   Procedure: A/V Fistulagram;  Surgeon: Margherita Shell, MD;  Location: [facility];  Service: Cardiovascular;  Laterality: Left;   ABDOMINAL HYSTERECTOMY     APPLICATION OF WOUND VAC Left 05/26/2023   Procedure: APPLICATION OF WOUND VAC;  Surgeon: Kayla Part, MD;  Location: [facility] OR;  Service: Vascular;  Laterality: Left;   AV FISTULA PLACEMENT  11/2014   at [facility] hospital   AV FISTULA REPAIR N/A 04/2021   BALLOON DILATION N/A 07/10/2016   Procedure: BALLOON DILATION;  Surgeon: Alyce Jubilee, MD;  Location: [facility];  Service: Endoscopy;  Laterality: N/A;  Pyloric dilation   BREAST LUMPECTOMY     CATARACT EXTRACTION W/PHACO Right 05/24/2023   Procedure: CATARACT EXTRACTION PHACO AND INTRAOCULAR LENS PLACEMENT (IOC);  Surgeon: Tarri Farm, MD;  Location: [facility] ORS;  Service: Ophthalmology;  Laterality: Right;  CDE: 5.00   CESAREAN SECTION     CHOLECYSTECTOMY     COLONOSCOPY  02/2023   COLONOSCOPY WITH PROPOFOL  N/A 09/27/2016   Dr. Riley Cheadle: Internal hemorrhoids repeat colonoscopy  in 10 years   COLONOSCOPY WITH PROPOFOL  N/A 02/28/2023   Procedure: COLONOSCOPY WITH PROPOFOL ;  Surgeon: Suzette Espy, MD;  Location: [facility];  Service: Endoscopy;  Laterality: N/A;  11:45 am, asa 3 dialysis pt   DIALYSIS/PERMA CATHETER INSERTION N/A 11/15/2023   Procedure: DIALYSIS/PERMA CATHETER INSERTION;  Surgeon: Margherita Shell, MD;  Location: [facility] INVASIVE CV LAB;  Service: Cardiovascular;  Laterality: N/A;   DILATION AND CURETTAGE OF UTERUS     ESOPHAGOGASTRODUODENOSCOPY N/A 07/10/2016   Dr.Fields- normal esophagus, gastric stenosis was found at the pylorus, gastritis on bx, normal examined duodenun   ESOPHAGOGASTRODUODENOSCOPY (EGD) WITH PROPOFOL  N/A 11/21/2022   Procedure: ESOPHAGOGASTRODUODENOSCOPY (EGD) WITH PROPOFOL ;  Surgeon: Umberto Ganong,  Bearl Limes, MD;  Location: [facility];  Service: Gastroenterology;  Laterality: N/A;   EXTERNAL FIXATION REMOVAL Right 10/29/2018   Procedure: REMOVAL RIGHT ANKLE BIOMET ZIMMER EXTERNAL FIXATOR, SHORT LEG CAST APPLICATION;  Surgeon: Adah Acron, MD;  Location: [facility] OR;  Service: Orthopedics;  Laterality: Right;   INSERTION OF DIALYSIS CATHETER  05/26/2023   Procedure: INSERTION OF Left internal jugular DIALYSIS CATHETER;  Surgeon: Kayla Part, MD;  Location: [facility] OR;  Service: Vascular;;   IR FLUORO GUIDE CV LINE LEFT  08/28/2023   IR FLUORO GUIDE CV LINE RIGHT  09/24/2023   IR PATIENT EVAL TECH 0-60 MINS  09/24/2023   IR US  GUIDE VASC ACCESS LEFT  08/28/2023   IR US  GUIDE VASC ACCESS RIGHT  09/24/2023   MASTECTOMY     left sided   ORIF ANKLE FRACTURE Right 10/06/2018   Procedure: OPEN REDUCTION INTERNAL FIXATION (ORIF) RIGHT ANKLE TRIMALLEOLAR;  Surgeon: Adah Acron, MD;  Location: [facility] OR;  Service: Orthopedics;  Laterality: Right;   PERIPHERAL VASCULAR BALLOON ANGIOPLASTY  08/13/2023   Procedure: PERIPHERAL VASCULAR BALLOON ANGIOPLASTY;  Surgeon: Margherita Shell, MD;  Location: [facility];  Service:  Cardiovascular;;   REFRACTIVE SURGERY Right 01/30/2024   REVISON OF ARTERIOVENOUS FISTULA Left 05/26/2023   Procedure: REVISON OF ARTERIOVENOUS FISTULA LEFT ARM HEMATOMA WASH OUT;  Surgeon: Kayla Part, MD;  Location: [facility] OR;  Service: Vascular;  Laterality: Left;   RIGHT/LEFT HEART CATH AND CORONARY ANGIOGRAPHY N/A 11/27/2022   Procedure: RIGHT/LEFT HEART CATH AND CORONARY ANGIOGRAPHY;  Surgeon: Swaziland, Peter M, MD;  Location: [facility] INVASIVE CV LAB;  Service: Cardiovascular;  Laterality: N/A;   TEE WITHOUT CARDIOVERSION N/A 09/18/2023   Procedure: TRANSESOPHAGEAL ECHOCARDIOGRAM (TEE);  Surgeon: Gerard Knight, MD;  Location: [facility];  Service: Cardiovascular;  Laterality: N/A;   FAMILY HISTORY Family History  Problem Relation Age of Onset   Diabetes Mellitus II Mother    Hypertension Mother    Heart block Mother    Hypertension Sister    Hypertension Sister    Colon cancer Neg Hx    SOCIAL HISTORY Social History   Tobacco Use   Smoking status: Never   Smokeless tobacco: Never  Vaping Use   Vaping status: Never Used  Substance Use Topics   Alcohol use: No    Alcohol/week: 0.0 standard drinks of alcohol   Drug use: No       OPHTHALMIC EXAM:  Not recorded    IMAGING AND PROCEDURES  Imaging and Procedures for 05/05/2024         ASSESSMENT/PLAN:   ICD-10-CM   1. Proliferative diabetic retinopathy of both eyes with macular edema associated with type 2 diabetes mellitus (HCC)  Z61.0960     2. 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 Essential hypertension  I10     5. Hypertensive retinopathy of both eyes  H35.033     6. Pseudophakia  Z96.1       1,2. Proliferative diabetic retinopathy, both eyes  - A1C 6.9 (11.27.24) - delayed f/u from 4 weeks to 9 weeks (08.20.24-10.22.24) due to being in the hospital  - s/p IVA OS #1 (05.14.24) - s/p IVA OD #1 (05.16.24) - s/p IVA OU # 2 (06.25.24), #3 (07.23.24), #4  (08.20.24), #5 (10.22.24), #6 (11.19.24), #7 (12.17.24) ======================== - s/p IVE OU #1 (01.21.25), #2 (02.18.25), #3 (04.22.25)  - s/p PRP OS (05.21.24)  -  s/p PRP OD (08.01.24) - previously followed with Dr. Inis Mani at [facility], but had been lost to f/u since 10.28.2020 - h/o PRP OU; had never received anti-VEGF injxns prior to here, per report - exam shows old white VH + some red subhyaloid heme OD; OS with focal clusters of preretinal heme turning white -- nasal and temporal periphery - FA (05.14.24) shows OD: Clusters of perivascular leakage inferior and temporal midzone -- ?early NV; OS: Clusters of NVE temporal midzone -- pt would benefit from fill in PRP OU (OS first--done 05.21.24) - FA (02.18.25) shows OD: Clusters of perivascular leakage inferior and temporal midzone -- ?early NV -- improved, no NV; OS: Clusters of NVE temporal midzone -- improved - OCT shows OD: Focal IRHM and trace cystic changes temporal fovea and macula; IT schisis - stable, +vitreous opacities -- slightly improved, partial PVD; OS: Persistent IRF/IRHM/edema temporal fovea and macula -- slightly improved; IN schisis not imaged today at 4 weeks - recommend IVE OU #4 today 05.20.25 w/ f/u in 4 wks - pt wishes to proceed with injections - RBA of procedure discussed, questions answered - Eylea  informed consent obtained and signed 01.21.25 (OU) - see procedure note - Eylea  approved for 2025 - f/u 4 weeks, DFE, OCT, possible injection(s)   3. Retinoschisis OU  - peripheral retinoschisis confirmed by OCT  - OD -- inferotemporal periphery  - OS -- nasal periphery  - stable, monitor  4,5. Hypertensive retinopathy OU - discussed importance of tight BP control - monitor  6. Pseudophakia OD  - s/p CE/IOL OD (Dr. Adora Hoover, 06.07.24)  - IOL in good position  - s/p YAG cap OD (02.12.25)  - monitor  7. Mixed Cataract OS - The symptoms of cataract, surgical options, and treatments and risks were discussed with  patient. - discussed diagnosis and progression - under the expert management of Dr. Adora Hoover - clear from a retina standpoint to proceed with cataract surgery when pt and surgeon are ready  Ophthalmic Meds Ordered this visit:  No orders of the defined types were placed in this encounter.    No follow-ups on file.  There are no Patient Instructions on file for this visit.  Explained the diagnoses, plan, and follow up with the patient and they expressed understanding.  Patient expressed understanding of the importance of proper follow up care.   This document serves as a record of services personally performed by Jeanice Millard, MD, PhD. It was created on their behalf by Olene Berne, COT an ophthalmic technician. The creation of this record is the provider's dictation and/or activities during the visit.    Electronically signed by:  Olene Berne, COT  04/23/24 10:36 AM  Jeanice Millard, M.D., Ph.D. Diseases & Surgery of the Retina and Vitreous [facility]    Abbreviations: M myopia (nearsighted); A astigmatism; H hyperopia (farsighted); P presbyopia; Mrx spectacle prescription;  CTL contact lenses; OD right eye; OS left eye; OU both eyes  XT exotropia; ET esotropia; PEK punctate epithelial keratitis; PEE punctate epithelial erosions; DES dry eye syndrome; MGD meibomian gland dysfunction; ATs artificial tears; PFAT's preservative free artificial tears; NSC nuclear sclerotic cataract; PSC posterior subcapsular cataract; ERM epi-retinal membrane; PVD posterior vitreous detachment; RD retinal detachment; DM diabetes mellitus; DR diabetic retinopathy; NPDR non-proliferative diabetic retinopathy; PDR proliferative diabetic retinopathy; CSME clinically significant macular edema; DME diabetic macular edema; dbh dot blot hemorrhages; CWS cotton wool spot; POAG primary open angle glaucoma; C/D cup-to-disc ratio; HVF humphrey  visual field; GVF goldmann visual field;  OCT optical coherence tomography; IOP intraocular pressure; BRVO Branch retinal vein occlusion; CRVO central retinal vein occlusion; CRAO central retinal artery occlusion; BRAO branch retinal artery occlusion; RT retinal tear; SB scleral buckle; PPV pars plana vitrectomy; VH Vitreous hemorrhage; PRP panretinal laser photocoagulation; IVK intravitreal kenalog; VMT vitreomacular traction; MH Macular hole;  NVD neovascularization of the disc; NVE neovascularization elsewhere; AREDS age related eye disease study; ARMD age related macular degeneration; POAG primary open angle glaucoma; EBMD epithelial/anterior basement membrane dystrophy; ACIOL anterior chamber intraocular lens; IOL intraocular lens; PCIOL posterior chamber intraocular lens; Phaco/IOL phacoemulsification with intraocular lens placement; PRK photorefractive keratectomy; LASIK laser assisted in situ keratomileusis; HTN hypertension; DM diabetes mellitus; COPD chronic obstructive pulmonary disease

## 2024-04-24 DIAGNOSIS — N186 End stage renal disease: Secondary | ICD-10-CM | POA: Diagnosis not present

## 2024-04-24 DIAGNOSIS — N2581 Secondary hyperparathyroidism of renal origin: Secondary | ICD-10-CM | POA: Diagnosis not present

## 2024-04-24 DIAGNOSIS — Z992 Dependence on renal dialysis: Secondary | ICD-10-CM | POA: Diagnosis not present

## 2024-04-24 DIAGNOSIS — D631 Anemia in chronic kidney disease: Secondary | ICD-10-CM | POA: Diagnosis not present

## 2024-04-24 DIAGNOSIS — D509 Iron deficiency anemia, unspecified: Secondary | ICD-10-CM | POA: Diagnosis not present

## 2024-04-27 DIAGNOSIS — D509 Iron deficiency anemia, unspecified: Secondary | ICD-10-CM | POA: Diagnosis not present

## 2024-04-27 DIAGNOSIS — N186 End stage renal disease: Secondary | ICD-10-CM | POA: Diagnosis not present

## 2024-04-27 DIAGNOSIS — Z992 Dependence on renal dialysis: Secondary | ICD-10-CM | POA: Diagnosis not present

## 2024-04-27 DIAGNOSIS — D631 Anemia in chronic kidney disease: Secondary | ICD-10-CM | POA: Diagnosis not present

## 2024-04-27 DIAGNOSIS — N2581 Secondary hyperparathyroidism of renal origin: Secondary | ICD-10-CM | POA: Diagnosis not present

## 2024-04-29 DIAGNOSIS — N186 End stage renal disease: Secondary | ICD-10-CM | POA: Diagnosis not present

## 2024-04-29 DIAGNOSIS — Z992 Dependence on renal dialysis: Secondary | ICD-10-CM | POA: Diagnosis not present

## 2024-04-29 DIAGNOSIS — N2581 Secondary hyperparathyroidism of renal origin: Secondary | ICD-10-CM | POA: Diagnosis not present

## 2024-04-29 DIAGNOSIS — D509 Iron deficiency anemia, unspecified: Secondary | ICD-10-CM | POA: Diagnosis not present

## 2024-04-29 DIAGNOSIS — D631 Anemia in chronic kidney disease: Secondary | ICD-10-CM | POA: Diagnosis not present

## 2024-04-30 ENCOUNTER — Ambulatory Visit (HOSPITAL_COMMUNITY)
Admission: RE | Admit: 2024-04-30 | Discharge: 2024-04-30 | Disposition: A | Discharge status: Home / Self Care | Payer: Medicare Other | Source: Ambulatory Visit | Attending: "Endocrinology | Admitting: "Endocrinology

## 2024-04-30 DIAGNOSIS — E1122 Type 2 diabetes mellitus with diabetic chronic kidney disease: Secondary | ICD-10-CM | POA: Diagnosis not present

## 2024-04-30 DIAGNOSIS — E042 Nontoxic multinodular goiter: Secondary | ICD-10-CM | POA: Diagnosis not present

## 2024-04-30 DIAGNOSIS — N186 End stage renal disease: Secondary | ICD-10-CM | POA: Diagnosis not present

## 2024-05-01 DIAGNOSIS — N186 End stage renal disease: Secondary | ICD-10-CM | POA: Diagnosis not present

## 2024-05-01 DIAGNOSIS — Z992 Dependence on renal dialysis: Secondary | ICD-10-CM | POA: Diagnosis not present

## 2024-05-01 DIAGNOSIS — N2581 Secondary hyperparathyroidism of renal origin: Secondary | ICD-10-CM | POA: Diagnosis not present

## 2024-05-01 DIAGNOSIS — D509 Iron deficiency anemia, unspecified: Secondary | ICD-10-CM | POA: Diagnosis not present

## 2024-05-01 DIAGNOSIS — D631 Anemia in chronic kidney disease: Secondary | ICD-10-CM | POA: Diagnosis not present

## 2024-05-03 DIAGNOSIS — Z992 Dependence on renal dialysis: Secondary | ICD-10-CM | POA: Diagnosis not present

## 2024-05-03 DIAGNOSIS — N186 End stage renal disease: Secondary | ICD-10-CM | POA: Diagnosis not present

## 2024-05-03 NOTE — Progress Notes (Deleted)
 Cardiology Office Note    Date:  05/03/2024  ID:  Pat[name], DOB [birth year], MRN [id number] PCP:  Patricia Cora, PA-C  Cardiologist:  Patricia Berger, MD  Electrophysiologist:  None   Chief Complaint: ***  History of Present Illness: .    Patricia [name] is a 53 y.o. female with visit-pertinent history of chronic dyspnea related to ILD, ESRD on HD, hypertension, mild nonobstructive CAD, hyperlipidemia, type II DM, OSA.  Patient was last seen and office on 05/03/2022 by Dr. Avanell [name].  Patient had been stable and denied any recent chest pain or palpitations.  Is felt that her dyspnea was likely felt multifactorial in setting of COPD, ESRD and hypertension.  In 11/2022 patient was admitted to [name] for acute hypoxic respiratory failure in the setting of pulmonary edema and obliterans bronchiolitis.  She initially required BiPAP but was transition to 2 L nasal cannula prior to discharge.  She was noted to have hypertensive urgency on admission and require nitroglycerin  drip but was transition to p.o. medications throughout admission.  Echo showed a preserved EF of 70 to 75% with no RWMA, G1 DD, normal RV function mild AS.  Her hospitalization was complicated by acute blood loss anemia with a hemoglobin of 8.5 admission.  GI was consulted she underwent EGD which showed no source of acute bleeding was recommended to plan for outpatient colonoscopy.  She return to the ED on 11/26/2022 for evaluation of chest pain which started while at dialysis.  Patient reported that she had been having intermittent chest discomfort since undergoing fistulogram at [name] in 09/2022.  On 11/27/2022 she underwent right and left heart catheterization that indicated normal coronary anatomy, LV end-diastolic pressure was mildly elevated, moderate pulmonary hypertension, medical management was recommended.  In 08/2023 during dialysis she was noted to have a low-grade fever.  She was treated acetaminophen  and completed HD.   During her session her left upper extremity fistula was used but she developed pain and discomfort, treatment was completed through HD catheter.  Patient subsequently presented to the emergency department with generalized malaise, nausea/vomiting and fevers.  She was found to have MSSA bacteremia and started on cefazolin .  Her tunneled dialysis catheter was removed on 09/16/2023 and a temporary dialysis catheter was placed after line holiday.  TEE was negative for vegetations.  Repeat blood cultures remain negative.  IR was reconsulted for placement of a new tunneled dialysis catheter.  Patient was discharged on 09/24/2023 in stable condition.  Today she presents for preoperative cardiac evaluation.  She reports that she  Preoperative cardiac evaluation: L3-L4 sublaminar decompression with Dr. Audie [name].  {Select to add RCRI Risk (<1%=LOW; >/=1%=HIGH) (Optional):21036017}  {Select if HIGH (RCRI >/=1%) Risk (Optional):21036030} Recommendations: {2014 ACC/AHA Perioperative Guidelines  :21036001} Antiplatelet and/or Anticoagulation Recommendations: {Antiplatelet Recommendations                  :21036016} {Anticoagulation Recommendations           :40981191}   CAD: Cardiac catheterization in 11/2022 indicated normal coronary arteries.  Today she reports HFpEF: TTE on 09/18/2023 indicated LVEF of 60 to 65%, no RWMA, mild concentric LVH, RV systolic function and size was normal, LA was mildly dilated, RA was mildly dilated, trivial mitral valve regurgitation, aortic valve sclerosis was present without any evidence of stenosis, mild calcification of the aortic valve. Today she appears euvolemic Hypertension: Blood pressure today ESRD: On HD Monday Wednesday Friday.  Followed by nephrology. Type 2 diabetes mellitus:  Labwork independently reviewed:  ROS: .   *** denies chest pain, shortness of breath, lower extremity edema, fatigue, palpitations, melena, hematuria, hemoptysis, diaphoresis, weakness,  presyncope, syncope, orthopnea, and PND.  All other systems are reviewed and otherwise negative.  Studies Reviewed: [name]    EKG:  EKG is ordered today, personally reviewed, demonstrating ***     CV Studies: Cardiac studies reviewed are outlined and summarized above. Otherwise please see EMR for full report. Cardiac Studies & Procedures   ______________________________________________________________________________________________ CARDIAC CATHETERIZATION  CARDIAC CATHETERIZATION 11/27/2022  Conclusion   LV end diastolic pressure is mildly elevated.   Hemodynamic findings consistent with moderate pulmonary hypertension.  Normal coronary anatomy Mildly elevated LV filling pressures. LVEDP 18 mm Hg. PCWP 30/28 mean of 22 mm Hg Moderate pulmonary HTN PAP 76/21 mean 43 mm Hg HIgh cardiac output by Fick due to AV shunt.  Plan: medical management.  Findings Coronary Findings Diagnostic  Dominance: Right  Left Main Vessel was injected. Vessel is normal in caliber. Vessel is angiographically normal.  Left Anterior Descending Vessel was injected. Vessel is normal in caliber. Vessel is angiographically normal.  Left Circumflex Vessel was injected. Vessel is normal in caliber. Vessel is angiographically normal.  Right Coronary Artery Vessel was injected. Vessel is normal in caliber. Vessel is angiographically normal.  Intervention  No interventions have been documented.   STRESS TESTS  NM MYOCAR MULTI W/SPECT W 04/12/2016  Narrative  There was no ST segment deviation noted during stress.  The study is normal.  This is a low risk study.  Nuclear stress EF: 65%.   ECHOCARDIOGRAM  ECHOCARDIOGRAM COMPLETE 09/15/2023  Narrative ECHOCARDIOGRAM REPORT    Patient Name:   Pat[name] Date of Exam: 09/15/2023 Medical Rec #:  [id number]   Height:       63.0 in Accession #:    [id number]  Weight:       245.0 lb Date of Birth:  [birth year]  BSA:          2.108 m Patient Age:     51 years    BP:           125132/4757 mmHg Patient Gender: F           HR:           93 bpm. Exam Location:  [name]  Procedure: 2D Echo, Cardiac Doppler and Color Doppler  Indications:    Fever R50.9  History:        Patient has prior history of Echocardiogram examinations, most recent 11/19/2022. CHF; Risk Factors:Hypertension, Diabetes and Dyslipidemia. ESRD on dialysis [facility]), Breast cancer (HCC), OSA (obstructive sleep apnea), Covid-19.  Sonographer:    Denese Finn RCS Referring Phys: [id number] MAURICIO DANIEL ARRIEN  IMPRESSIONS   1. Left ventricular ejection fraction, by estimation, is 60 to 65%. The left ventricle has normal function. The left ventricle has no regional wall motion abnormalities. There is mild concentric left ventricular hypertrophy. Left ventricular diastolic parameters are consistent with Grade I diastolic dysfunction (impaired relaxation). 2. Right ventricular systolic function is normal. The right ventricular size is normal. Tricuspid regurgitation signal is inadequate for assessing PA pressure. 3. No evidence of mitral valve regurgitation. 4. The aortic valve is grossly normal. Aortic valve regurgitation is not visualized. Mild aortic valve stenosis. 5. The inferior vena cava is normal in size with greater than 50% respiratory variability, suggesting right atrial pressure of 3 mmHg.  Comparison(s): No significant change from prior study.  Conclusion(s)/Recommendation(s): No evidence of valvular vegetations on this transthoracic echocardiogram.  Consider a transesophageal echocardiogram to exclude infective endocarditis if clinically indicated.  FINDINGS Left Ventricle: Left ventricular ejection fraction, by estimation, is 60 to 65%. The left ventricle has normal function. The left ventricle has no regional wall motion abnormalities. The left ventricular internal cavity size was normal in size. There is mild concentric left ventricular hypertrophy. Left  ventricular diastolic parameters are consistent with Grade I diastolic dysfunction (impaired relaxation).  Right Ventricle: The right ventricular size is normal. Right ventricular systolic function is normal. Tricuspid regurgitation signal is inadequate for assessing PA pressure.  Left Atrium: Left atrial size was normal in size.  Right Atrium: Right atrial size was normal in size.  Pericardium: There is no evidence of pericardial effusion.  Mitral Valve: Mild mitral annular calcification. No evidence of mitral valve regurgitation.  Tricuspid Valve: Tricuspid valve regurgitation is not demonstrated.  Aortic Valve: The aortic valve is grossly normal. Aortic valve regurgitation is not visualized. Mild aortic stenosis is present. Aortic valve mean gradient measures 10.3 mmHg. Aortic valve peak gradient measures 21.8 mmHg. Aortic valve area, by VTI measures 1.93 cm.  Pulmonic Valve: Pulmonic valve regurgitation is not visualized.  Aorta: The aortic root is normal in size and structure.  Venous: The inferior vena cava is normal in size with greater than 50% respiratory variability, suggesting right atrial pressure of 3 mmHg.  IAS/Shunts: The interatrial septum was not well visualized.   LEFT VENTRICLE PLAX 2D LVIDd:         4.50 cm   Diastology LVIDs:         2.50 cm   LV e' medial:    3.59 cm/s LV PW:         1.30 cm   LV E/e' medial:  25.1 LV IVS:        1.20 cm   LV e' lateral:   8.32 cm/s LVOT diam:     1.90 cm   LV E/e' lateral: 10.8 LV SV:         76 LV SV Index:   36 LVOT Area:     2.84 cm   RIGHT VENTRICLE RV S prime:     15.60 cm/s TAPSE (M-mode): 2.6 cm  LEFT ATRIUM             Index        RIGHT ATRIUM           Index LA diam:        4.20 cm 1.99 cm/m   RA Area:     22.20 cm LA Vol (A2C):   53.1 ml 25.19 ml/m  RA Volume:   72.50 ml  34.40 ml/m LA Vol (A4C):   45.9 ml 21.78 ml/m LA Biplane Vol: 50.5 ml 23.96 ml/m AORTIC VALVE AV Area (Vmax):    1.84  cm AV Area (Vmean):   1.91 cm AV Area (VTI):     1.93 cm AV Vmax:           233.67 cm/s AV Vmean:          144.000 cm/s AV VTI:            0.394 m AV Peak Grad:      21.8 mmHg AV Mean Grad:      10.3 mmHg LVOT Vmax:         151.50 cm/s LVOT Vmean:        96.850 cm/s LVOT VTI:          0.268 m LVOT/AV VTI ratio: 0.68  AORTA Ao Root diam: 3.00 cm  MITRAL VALVE MV Area (PHT): 2.42 cm    SHUNTS MV Decel Time: 313 msec    Systemic VTI:  0.27 m MV E velocity: 90.20 cm/s  Systemic Diam: 1.90 cm MV A velocity: 91.70 cm/s MV E/A ratio:  0.98  Alois Arnt Electronically signed by Alois Arnt Signature Date/Time: 09/15/2023/11:00:06 AM    Final   TEE  ECHO TEE 09/18/2023  Narrative TRANSESOPHOGEAL ECHO REPORT    Patient Name:   [facility] Date of Exam: 09/18/2023 Medical Rec #:  [id number]   Height:       63.0 in Accession #:    [id number]  Weight:       247.1 lb Date of Birth:  1971/08/19  BSA:          2.116 m Patient Age:    51 years    BP:           165/81 mmHg Patient Gender: F           HR:           91 bpm. Exam Location:  [name]  Procedure: Transesophageal Echo, Cardiac Doppler and Color Doppler  Indications:    Bacteremia R78.81  History:        Patient has prior history of Echocardiogram examinations, most recent 09/15/2023. CHF; Risk Factors:Hypertension, Diabetes and Dyslipidemia. ESRD on dialysis [name]), Breast cancer (HCC), OSA (obstructive sleep apnea), Covid-19.  Sonographer:    Denese Finn RCS Referring Phys: [id number] Dorma Gash  PROCEDURE: After discussion of the risks and benefits of a TEE, an informed consent was obtained from the patient. TEE procedure time was 7 minutes. The transesophogeal probe was passed without difficulty through the esophogus of the patient. Imaged were obtained with the patient in a left lateral decubitus position. Local oropharyngeal anesthetic was provided with viscous lidocaine . Sedation performed by  different physician. The patient was monitored while under deep sedation. Image quality was good. The patient's vital signs; including heart rate, blood pressure, and oxygen  saturation; remained stable throughout the procedure. The patient developed no complications during the procedure.  IMPRESSIONS   1. Left ventricular ejection fraction, by estimation, is 60 to 65%. The left ventricle has normal function. The left ventricle has no regional wall motion abnormalities. There is mild concentric left ventricular hypertrophy. 2. Right ventricular systolic function is normal. The right ventricular size is normal. 3. Left atrial size was mildly dilated. No left atrial/left atrial appendage thrombus was detected. The LAA emptying velocity was 70 cm/s. 4. Right atrial size was mildly dilated. 5. The mitral valve is grossly normal. Trivial mitral valve regurgitation. 6. The aortic valve is tricuspid. There is mild calcification of the aortic valve. Aortic valve regurgitation is not visualized. Aortic valve sclerosis/calcification is present, without any evidence of aortic stenosis. 7. There is Moderate (Grade III) plaque involving the descending aorta.  Conclusion(s)/Recommendation(s): No valvular vegetations.  FINDINGS Left Ventricle: Left ventricular ejection fraction, by estimation, is 60 to 65%. The left ventricle has normal function. The left ventricle has no regional wall motion abnormalities. The left ventricular internal cavity size was normal in size. There is mild concentric left ventricular hypertrophy.  Right Ventricle: The right ventricular size is normal. Right vetricular wall thickness was not well visualized. Right ventricular systolic function is normal.  Left Atrium: Left atrial size was mildly dilated. No left atrial/left atrial appendage thrombus was detected. The LAA emptying velocity was 70 cm/s.  Right Atrium: Right atrial  size was mildly dilated.  Pericardium: There is no  evidence of pericardial effusion.  Mitral Valve: The mitral valve is grossly normal. Trivial mitral valve regurgitation.  Tricuspid Valve: The tricuspid valve is grossly normal. Tricuspid valve regurgitation is mild.  Aortic Valve: The aortic valve is tricuspid. There is mild calcification of the aortic valve. Aortic valve regurgitation is not visualized. Aortic valve sclerosis/calcification is present, without any evidence of aortic stenosis.  Pulmonic Valve: The pulmonic valve was grossly normal. Pulmonic valve regurgitation is trivial.  Aorta: The aortic root is normal in size and structure. There is moderate (Grade III) plaque involving the descending aorta.  IAS/Shunts: No atrial level shunt detected by color flow Doppler.  Additional Comments: Spectral Doppler performed.  Teddie Favre MD Electronically signed by Teddie Favre MD Signature Date/Time: 09/18/2023/12:57:59 PM    Final        ______________________________________________________________________________________________       Current Reported Medications:.    No outpatient medications have been marked as taking for the 05/05/24 encounter (Appointment) with Oliveah Zwack D, NP.    Physical Exam:    VS:  There were no vitals taken for this visit.   Wt Readings from Last 3 Encounters:  02/06/24 252 lb (114.3 kg)  01/30/24 252 lb 9.6 oz (114.6 kg)  01/23/24 255 lb 6.4 oz (115.8 kg)    GEN: Well nourished, well developed in no acute distress NECK: No JVD; No carotid bruits CARDIAC: ***RRR, no murmurs, rubs, gallops RESPIRATORY:  Clear to auscultation without rales, wheezing or rhonchi  ABDOMEN: Soft, non-tender, non-distended EXTREMITIES:  No edema; No acute deformity     Asessement and Plan:.     ***     Disposition: F/u with ***  Signed, Mekhi Sonn D Gabriela Giannelli, NP

## 2024-05-04 DIAGNOSIS — N2581 Secondary hyperparathyroidism of renal origin: Secondary | ICD-10-CM | POA: Diagnosis not present

## 2024-05-04 DIAGNOSIS — D509 Iron deficiency anemia, unspecified: Secondary | ICD-10-CM | POA: Diagnosis not present

## 2024-05-04 DIAGNOSIS — N186 End stage renal disease: Secondary | ICD-10-CM | POA: Diagnosis not present

## 2024-05-04 DIAGNOSIS — I469 Cardiac arrest, cause unspecified: Secondary | ICD-10-CM | POA: Diagnosis not present

## 2024-05-04 DIAGNOSIS — D631 Anemia in chronic kidney disease: Secondary | ICD-10-CM | POA: Diagnosis not present

## 2024-05-04 DIAGNOSIS — Z992 Dependence on renal dialysis: Secondary | ICD-10-CM | POA: Diagnosis not present

## 2024-05-05 ENCOUNTER — Encounter (INDEPENDENT_AMBULATORY_CARE_PROVIDER_SITE_OTHER): Admitting: Ophthalmology

## 2024-05-05 ENCOUNTER — Encounter (INDEPENDENT_AMBULATORY_CARE_PROVIDER_SITE_OTHER): Payer: Self-pay

## 2024-05-05 ENCOUNTER — Ambulatory Visit: Attending: Cardiology | Admitting: Cardiology

## 2024-05-05 DIAGNOSIS — H33103 Unspecified retinoschisis, bilateral: Secondary | ICD-10-CM

## 2024-05-05 DIAGNOSIS — I10 Essential (primary) hypertension: Secondary | ICD-10-CM

## 2024-05-05 DIAGNOSIS — H35033 Hypertensive retinopathy, bilateral: Secondary | ICD-10-CM

## 2024-05-05 DIAGNOSIS — E113513 Type 2 diabetes mellitus with proliferative diabetic retinopathy with macular edema, bilateral: Secondary | ICD-10-CM

## 2024-05-05 DIAGNOSIS — Z01810 Encounter for preprocedural cardiovascular examination: Secondary | ICD-10-CM

## 2024-05-05 DIAGNOSIS — Z961 Presence of intraocular lens: Secondary | ICD-10-CM

## 2024-05-05 DIAGNOSIS — Z794 Long term (current) use of insulin: Secondary | ICD-10-CM

## 2024-05-07 ENCOUNTER — Encounter: Payer: Self-pay | Admitting: Internal Medicine

## 2024-05-07 ENCOUNTER — Encounter: Payer: Medicare Other | Admitting: Internal Medicine

## 2024-05-07 NOTE — Progress Notes (Signed)
 This encounter was created in error - please disregard.

## 2024-05-11 DIAGNOSIS — D509 Iron deficiency anemia, unspecified: Secondary | ICD-10-CM | POA: Diagnosis not present

## 2024-05-11 DIAGNOSIS — D631 Anemia in chronic kidney disease: Secondary | ICD-10-CM | POA: Diagnosis not present

## 2024-05-11 DIAGNOSIS — N2581 Secondary hyperparathyroidism of renal origin: Secondary | ICD-10-CM | POA: Diagnosis not present

## 2024-05-11 DIAGNOSIS — Z992 Dependence on renal dialysis: Secondary | ICD-10-CM | POA: Diagnosis not present

## 2024-05-11 DIAGNOSIS — N186 End stage renal disease: Secondary | ICD-10-CM | POA: Diagnosis not present

## 2024-05-17 DEATH — deceased

## 2024-06-02 ENCOUNTER — Ambulatory Visit: Payer: Medicare Other | Admitting: "Endocrinology

## 2024-07-13 ENCOUNTER — Encounter: Payer: Self-pay | Admitting: Gastroenterology

## 2024-09-30 ENCOUNTER — Encounter (INDEPENDENT_AMBULATORY_CARE_PROVIDER_SITE_OTHER): Payer: Self-pay | Admitting: Gastroenterology
